# Patient Record
Sex: Male | Born: 1970
Health system: Southern US, Community
[De-identification: ages and names within clinical notes are randomized; demographics above are authoritative.]

## PROBLEM LIST (undated history)

## (undated) DIAGNOSIS — N183 Chronic kidney disease, stage 3 unspecified: Secondary | ICD-10-CM

## (undated) DIAGNOSIS — L03119 Cellulitis of unspecified part of limb: Secondary | ICD-10-CM

## (undated) DIAGNOSIS — I10 Essential (primary) hypertension: Secondary | ICD-10-CM

## (undated) DIAGNOSIS — E119 Type 2 diabetes mellitus without complications: Secondary | ICD-10-CM

## (undated) DIAGNOSIS — H269 Unspecified cataract: Secondary | ICD-10-CM

## (undated) DIAGNOSIS — L97509 Non-pressure chronic ulcer of other part of unspecified foot with unspecified severity: Secondary | ICD-10-CM

## (undated) DIAGNOSIS — R7989 Other specified abnormal findings of blood chemistry: Secondary | ICD-10-CM

## (undated) DIAGNOSIS — D352 Benign neoplasm of pituitary gland: Secondary | ICD-10-CM

## (undated) DIAGNOSIS — E538 Deficiency of other specified B group vitamins: Secondary | ICD-10-CM

## (undated) DIAGNOSIS — E669 Obesity, unspecified: Secondary | ICD-10-CM

## (undated) DIAGNOSIS — I7090 Unspecified atherosclerosis: Secondary | ICD-10-CM

## (undated) DIAGNOSIS — E11621 Type 2 diabetes mellitus with foot ulcer: Secondary | ICD-10-CM

## (undated) DIAGNOSIS — D509 Iron deficiency anemia, unspecified: Secondary | ICD-10-CM

## (undated) DIAGNOSIS — M869 Osteomyelitis, unspecified: Secondary | ICD-10-CM

## (undated) DIAGNOSIS — I70209 Unspecified atherosclerosis of native arteries of extremities, unspecified extremity: Secondary | ICD-10-CM

## (undated) DIAGNOSIS — R945 Abnormal results of liver function studies: Secondary | ICD-10-CM

## (undated) DIAGNOSIS — E782 Mixed hyperlipidemia: Secondary | ICD-10-CM

## (undated) DIAGNOSIS — Z794 Long term (current) use of insulin: Secondary | ICD-10-CM

## (undated) DIAGNOSIS — E1142 Type 2 diabetes mellitus with diabetic polyneuropathy: Secondary | ICD-10-CM

## (undated) DIAGNOSIS — L02619 Cutaneous abscess of unspecified foot: Secondary | ICD-10-CM

## (undated) DIAGNOSIS — E1169 Type 2 diabetes mellitus with other specified complication: Secondary | ICD-10-CM

## (undated) HISTORY — DX: Unspecified cataract: H26.9

## (undated) HISTORY — PX: LAPAROSCOPIC CHOLECYSTECTOMY: SUR755

## (undated) HISTORY — DX: Essential (primary) hypertension: I10

---
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
## 1998-01-20 HISTORY — PX: ANAL FISSURE REPAIR: SHX2312

## 1998-06-22 ENCOUNTER — Encounter (HOSPITAL_BASED_OUTPATIENT_CLINIC_OR_DEPARTMENT_OTHER): Payer: Self-pay | Admitting: General Surgery

## 1998-06-25 ENCOUNTER — Ambulatory Visit (HOSPITAL_COMMUNITY): Admission: RE | Admit: 1998-06-25 | Discharge: 1998-06-26 | Payer: Self-pay | Admitting: General Surgery

## 2001-05-26 ENCOUNTER — Encounter: Payer: Self-pay | Admitting: Urology

## 2001-05-26 ENCOUNTER — Ambulatory Visit (HOSPITAL_COMMUNITY): Admission: RE | Admit: 2001-05-26 | Discharge: 2001-05-26 | Payer: Self-pay | Admitting: Urology

## 2001-05-26 DIAGNOSIS — D352 Benign neoplasm of pituitary gland: Secondary | ICD-10-CM

## 2001-05-26 HISTORY — DX: Benign neoplasm of pituitary gland: D35.2

## 2002-10-04 ENCOUNTER — Ambulatory Visit (HOSPITAL_COMMUNITY): Admission: RE | Admit: 2002-10-04 | Discharge: 2002-10-04 | Payer: Self-pay | Admitting: Urology

## 2002-10-04 ENCOUNTER — Ambulatory Visit (HOSPITAL_BASED_OUTPATIENT_CLINIC_OR_DEPARTMENT_OTHER): Admission: RE | Admit: 2002-10-04 | Discharge: 2002-10-04 | Payer: Self-pay | Admitting: Urology

## 2004-01-21 HISTORY — PX: CIRCUMCISION: SUR203

## 2006-07-13 ENCOUNTER — Emergency Department (HOSPITAL_COMMUNITY): Admission: EM | Admit: 2006-07-13 | Discharge: 2006-07-13 | Payer: Self-pay | Admitting: *Deleted

## 2006-09-09 ENCOUNTER — Ambulatory Visit (HOSPITAL_COMMUNITY): Admission: RE | Admit: 2006-09-09 | Discharge: 2006-09-10 | Payer: Self-pay | Admitting: General Surgery

## 2006-09-09 ENCOUNTER — Encounter (INDEPENDENT_AMBULATORY_CARE_PROVIDER_SITE_OTHER): Payer: Self-pay | Admitting: General Surgery

## 2007-01-26 ENCOUNTER — Observation Stay (HOSPITAL_COMMUNITY): Admission: AD | Admit: 2007-01-26 | Discharge: 2007-01-28 | Payer: Self-pay | Admitting: *Deleted

## 2007-03-01 ENCOUNTER — Encounter: Admission: RE | Admit: 2007-03-01 | Discharge: 2007-03-01 | Payer: Self-pay | Admitting: *Deleted

## 2010-06-04 NOTE — Discharge Summary (Signed)
NAME:  [name], [name]                ACCOUNT NO.:  [id number]   MEDICAL RECORD NO.:  [id number]          PATIENT TYPE:  INP   LOCATION:  1520                         FACILITY:  [facility]   PHYSICIAN:  Ladell Pier, M.D.   DATE OF BIRTH:  April 18, 1970   DATE OF ADMISSION:  01/26/2007  DATE OF DISCHARGE:  01/28/2007                               DISCHARGE SUMMARY   DISCHARGE DIAGNOSES:  1. Mild diabetic ketoacidosis.  2. Dehydration.  3. Type 2 diabetes diagnosed three years ago.  4. Hypertension.  5. Hyperlipidemia.  6. Status post cholecystectomy.   DISCHARGE MEDICATIONS:  1. Lantus 30 units subcu every evening.  2. Sliding scale insulin resistance scale as directed.  3. Actos 45 mg daily.  4. Januvia 100 mg daily.  5. Metformin 1000 twice daily.  That will be held until the patient      follows up with his primary care physician.  6. Baby aspirin 81 mg daily.  7. Altace 2.5 mg daily.  8. Vytorin 10/80 daily.  9. Lopid 600 mg twice daily.   FOLLOW-UP APPOINTMENTS:  The patient to follow up with Dr. Larina Bras in one  week.   PROCEDURES:  None.   CONSULTANTS:  None.   HISTORY OF PRESENT ILLNESS:  The patient is a 40 year old gentleman with  a history of diabetes who presented to his primary care physician after  weeks of polyuria and polydipsia followed by onset of nausea, vomiting  and abdominal pain.  The patient was instructed to start a diabetic  regimen.  He understood the instructions.  Past medical history, family  history, social history, meds, allergies and review of systems per  admission H&P.   PHYSICAL EXAMINATION ON DISCHARGE:  VITAL SIGNS:  Temperature 98.5,  pulse of 89, respirations 18, blood pressure 144/84, pulse ox 99% on  room air.  CBG 279.  HEENT:  Head is normocephalic, atraumatic.  Pupils reactive to light.  Throat without erythema.  CARDIOVASCULAR:  Regular rhythm.  LUNGS:  Clear bilaterally.  ABDOMEN:  Positive bowel sounds.  EXTREMITIES:  Without  edema.   HOSPITAL COURSE:  1. Mild DKA:  The patient was admitted to the hospital, given IV      fluids and was put on insulin.  His blood sugar improved.  His      anion gap closed.  His bicarb on discharge was 18.  His creatinine      increased up to 1.52.  His metformin was held.  On discharge his      creatinine is 1.23.  With a mild acidosis, will continue to hold      his metformin.  He will get his BMP checked with he follows up with      his primary care physician to see if he can restart his metformin.  2. Dyslipidemia:  He was continued on his cholesterol medication while      he was an inpatient.  3. Hypertension:  Blood pressure was mildly elevated at discharge, but      that is most likely secondary to the patient receiving high volume  fluids.  4. Hyponatremia:  The patient had mild hyponatremia during his      admission that continued to improve.  He is to get his BMP checked      when he follows up with his PCP.   DISCHARGE LABORATORIES:  Sodium 129, potassium 3.5, chloride 102, CO2  18, glucose 270, BUN 12, creatinine 1.23.  WBC 5.5, hemoglobin 12.2,  platelets 276.  Hemoglobin A1c 14.5.  Lipid panel:  Total cholesterol  109, triglycerides 79, HDL 50, LDL 43.      Ladell Pier, M.D.  Electronically Signed     NJ/MEDQ  D:  01/28/2007  T:  01/28/2007  Job:  [id number]   cc:   Dr. Lenore Manner [facility]

## 2010-06-04 NOTE — Op Note (Signed)
NAME:  [name], [name]                ACCOUNT NO.:  [id number]   MEDICAL RECORD NO.:  [id number]          PATIENT TYPE:  OIB   LOCATION:  1537                         FACILITY:  [facility]   PHYSICIAN:  Adolph Pollack, M.D.DATE OF BIRTH:  [birth year]   DATE OF PROCEDURE:  09/09/2006  DATE OF DISCHARGE:  09/10/2006                               OPERATIVE REPORT   PREOPERATIVE DIAGNOSIS:  Symptomatic cholelithiasis.   POSTOPERATIVE DIAGNOSIS:  Chronic cholecystitis.   PROCEDURE:  Laparoscopic cholecystectomy with operative cholangiogram.   SURGEON:  Adolph Pollack, M.D.   ANESTHESIA:  General.   INDICATIONS FOR PROCEDURE:  [name] is a 40 year old male with  diabetes who had a basically classic case of biliary colic and he was  seen in the emergency department at the end of June.  He had an  ultrasound performed demonstrating cholelithiasis but no other signs of  acute cholecystitis and liver function tests were normal.  He presents  now for elective laparoscopic cholecystectomy.  Of note is that his  preoperative hemoglobin A1c is 10.9.  I have had a long discussion with  him and his wife about this and about the fact that they will need to go  back with Dr. Larina Bras to talk about better control of his diabetes.  His  CBG in the holding room is 176.   TECHNIQUE:  He is brought to the operating room, placed supine on the  operating table and a general anesthetic was administered.  His  abdominal wall was then sterilely prepped and draped.  Marcaine was  infiltrated in the subumbilical region.  A small subumbilical incision  was made through the skin, the cutaneous tissue and then the fascia.  The peritoneal cavity was entered under direct vision.  A pursestring  suture of 0 Vicryl was placed around the fascial edges.  A Hassan trocar  was introduced into the peritoneal cavity and pneumoperitoneum was  created by insufflation of CO2 gas.   Next, the laparoscope was introduced.  He  was placed in reverse  Trendelenburg position, right side tilted slightly up.  A 11 mm trocar  was placed through an epigastric incision and a 5 mm trocar was placed  in the right upper quadrant laterally and one in the right upper  quadrant at the mid clavicular line.  The fundus of the gallbladder  appeared to be intrahepatic but without inflammatory change.  This was  grasped and retracted toward the shoulder.  There were significant  adhesions and significant chronic inflammatory changes from the mid body  all the way down to the infundibulum including an inflammatory type  peel.  I used careful blunt dissection, staying on the gallbladder to  free this up.  I then carefully mobilized the infundibulum of the  gallbladder.  I identified an anterior branch of the cystic artery.  It  was lying anterior to the cystic duct.  This was clipped and divided.  I  then carefully created a window around the cystic duct using blunt  dissection and was able to get good length on it.  I placed a  clip at  the cystic duct gallbladder junction.  A small incision was made in the  cystic duct and bile milked back from it.  The cholangiocatheter was  passed through the anterior abdominal wall and placed into the cystic  duct and the cholangiogram was performed.   Under real time fluoroscopy, dilute contrast was injected into the  cystic duct which was quite long.  The common hepatic, right and left  hepatic and common bile ducts all filled with contrast promptly and  contrast drained promptly into the duodenum without obvious evidence of  obstruction.  Final reports pending the radiologist's interpretation.   The cholangiocath was removed, the cystic duct was then clipped three  times proximally and divided.  I then used careful blunt dissection.  There was more significant inflammatory change from the infundibulum of  the gallbladder and I identified the posterior branch of the cystic  artery and  clipped it and divided it.  I also clipped some of the  inflammatory peel and divided it close to the gallbladder.  I then  dissected the gallbladder free from the liver and from the mid body up,  it was intrahepatic without any plane.  There was spillage of bile.  No  spillage of stones.  I eventually was able to dissect the gallbladder  free from the liver bed and place it in an Endopouch bag.  Raw surface  of the liver was then cauterized extensively to control the bleeding.  I  copiously irrigated out this area.  No bile leak or further bleeding was  noted.  Because of the extensive raw surface of liver, however, I felt  it would be prudent to leave a drain in.  I packed Surgicel in the  gallbladder fossa.  I then inserted a drain into the peritoneal cavity  and had it exit out the lateral 5 mm trocar site and was anchored to the  skin with a 3-0 nylon suture.  The drain was placed in the gallbladder  fossa.   I evacuated as much fluid as possible.  I then removed the gallbladder  in the Endopouch bag through the subumbilical port and closed the  subumbilical fascial defect under laparoscopic vision by tightening up  and tying down the pursestring suture.  The remaining trocars were  removed.  The pneumoperitoneum was released.  The drain was hooked up to  closed suction.  The skin incisions were closed with 4-0 Monocryl  subcuticular stitches followed by Steri-Strips and sterile dressings.   He tolerated the procedure well without any apparent complications and  was taken to the recovery room in satisfactory condition.  We will be  keeping him overnight for observation and to teach some drain care.      Adolph Pollack, M.D.  Electronically Signed     TJR/MEDQ  D:  09/09/2006  T:  09/10/2006  Job:  [id number]   cc:   Ace Gins, MD

## 2010-06-04 NOTE — H&P (Signed)
NAME:  [name], [name]                ACCOUNT NO.:  [id number]   MEDICAL RECORD NO.:  [id number]          PATIENT TYPE:  INP   LOCATION:  1520                         [facility]ACILITY[facility]  [facility]   PHYSICIAN:  Lonia Blood, M.D.       DATE OF BIRTH:  10/31/1970   DATE OF ADMISSION:  01/26/2007  DATE OF DISCHARGE:                              HISTORY & PHYSICAL   PRIMARY CARE PHYSICIAN:  Dr. Larina Bras with [name].   CHIEF COMPLAINT:  Dehydrated.   HISTORY OF PRESENT ILLNESS:  [name] is a 40 year old gentleman with a  history of diabetes mellitus who presented to his primary care physician  today after weeks of polyuria, polyphagia, polydipsia, followed by onset  of nausea, vomiting, abdominal pain today.  The patient has been  instructed to start on a different diabetic regimen but he misunderstood  the instructions and his diabetes got out of control.  He is not  checking his CBG's at home which definitely allowed for the possibility  of this uncontrolled diabetes to roam freely.   PAST MEDICAL HISTORY:  1. Cholecystectomy.  2. Diabetes mellitus type 2 diagnosed about 3 years ago.  3. Hypertension.  4. Hyperlipidemia.   HOME MEDICATIONS:  1. Metformin.  2. Januvia.  3. Actos.   Also the patient is supposed to be on aspirin, Altace, Lopid and  Vytorin.   SOCIAL HISTORY:  No tobacco use.  No alcohol use.  The patient is  married without children.  Works in a Fish farm manager.   FAMILY HISTORY:  Positive for diabetes in father, paternal uncles,  paternal aunts and grandmother on the father's side.   REVIEW OF SYSTEMS:  As per HPI otherwise reviewed and negative.   PHYSICAL EXAM:  Upon admission temperature 97.6, pulse 123, respirations  20, blood pressure 136/87, saturation 97% on room air.  CBG is measured  at 448.  GENERAL APPEARANCE:  This is an obese African American gentleman in no  acute distress, alert and oriented to place, person and time.  HEENT:  Head appears  normocephalic, atraumatic.  Eyes have pupils equal,  round, react to light and accommodation.  Extraocular muscles intact.  Throat clear.  NECK:  Supple.  No JVD.  CHEST:  Clear to auscultation.  No wheeze, rhonchi or crackles.  HEART:  Regular rate and rhythm without murmurs, rubs or gallop.  ABDOMEN:  Soft, nontender, obese.  Bowel sounds present.  No  hepatosplenomegaly.  EXTREMITIES:  Without edema.  SKIN:  Warm and dry without any suspicious rashes.  NEUROLOGIC:  Nonfocal.   LABORATORY VALUES ON ADMISSION:  Everything is pending.   IMPRESSION:  Uncontrolled diabetes mellitus type 2 with severe  dehydration.  The patient will be observed overnight.  Stat basic  metabolic profile will be obtained to rule out ketoacidosis.  Intravenous fluids and high doses of insulin will be started.  The  patient will be titrated on Lantus sliding scale insulin and resumed on  metformin and Actos as soon as he is able to tolerate p.o. well.  Diabetes education will be provided extensively.  Lonia Blood, M.D.  Electronically Signed     SL/MEDQ  D:  01/26/2007  T:  01/27/2007  Job:  [id number]   cc:   Ace Gins, MD

## 2010-10-09 LAB — URINALYSIS, ROUTINE W REFLEX MICROSCOPIC
Glucose, UA: >1000 — AB
Hgb urine dipstick: NEGATIVE
Ketones, ur: >80 — AB
Leukocytes, UA: NEGATIVE
Nitrite: NEGATIVE
Protein, ur: NEGATIVE
Specific Gravity, Urine: 1.03
Urobilinogen, UA: 0.2
pH: 5

## 2010-10-09 LAB — BASIC METABOLIC PANEL
BUN: 12
BUN: 17
BUN: 19
CO2: 13 — ABNORMAL LOW
CO2: 14 — ABNORMAL LOW
CO2: 18 — ABNORMAL LOW
Calcium: 10
Calcium: 8.3 — ABNORMAL LOW
Calcium: 9.1
Chloride: 102
Chloride: 93 — ABNORMAL LOW
Chloride: 98
Creatinine, Ser: 1.23
Creatinine, Ser: 1.52 — ABNORMAL HIGH
Creatinine, Ser: 1.69 — ABNORMAL HIGH
GFR calc Af Amer: 56 — ABNORMAL LOW
GFR calc Af Amer: >60
GFR calc Af Amer: >60
GFR calc non Af Amer: 46 — ABNORMAL LOW
GFR calc non Af Amer: 52 — ABNORMAL LOW
GFR calc non Af Amer: >60
Glucose, Bld: 270 — ABNORMAL HIGH
Glucose, Bld: 320 — ABNORMAL HIGH
Glucose, Bld: 386 — ABNORMAL HIGH
Potassium: 3.5
Potassium: 4.8
Potassium: 5.1
Sodium: 127 — ABNORMAL LOW
Sodium: 127 — ABNORMAL LOW
Sodium: 129 — ABNORMAL LOW

## 2010-10-09 LAB — HEMOGLOBIN A1C
Hgb A1c MFr Bld: 14.5 — ABNORMAL HIGH
Mean Plasma Glucose: 439

## 2010-10-09 LAB — LIPID PANEL
Cholesterol: 109
HDL: 50
LDL Cholesterol: 43
Total CHOL/HDL Ratio: 2.2
Triglycerides: 78
VLDL: 16

## 2010-10-09 LAB — CBC
HCT: 36 — ABNORMAL LOW
HCT: 43.7
Hemoglobin: 12.2 — ABNORMAL LOW
Hemoglobin: 14.7
MCHC: 33.7
MCHC: 34
MCV: 76.1 — ABNORMAL LOW
MCV: 76.4 — ABNORMAL LOW
Platelets: 276
Platelets: 397
RBC: 4.71
RBC: 5.75
RDW: 12.1
RDW: 12.2
WBC: 12.5 — ABNORMAL HIGH
WBC: 5.5

## 2010-10-09 LAB — URINE MICROSCOPIC-ADD ON

## 2010-11-01 LAB — URINE MICROSCOPIC-ADD ON

## 2010-11-01 LAB — HEMOGLOBIN A1C
Hgb A1c MFr Bld: 10.9 — ABNORMAL HIGH
Mean Plasma Glucose: 311

## 2010-11-01 LAB — BASIC METABOLIC PANEL
BUN: 16
CO2: 29
Calcium: 9.8
Chloride: 102
Creatinine, Ser: 1.08
GFR calc Af Amer: >60
GFR calc non Af Amer: >60
Glucose, Bld: 314 — ABNORMAL HIGH
Potassium: 4.5
Sodium: 139

## 2010-11-01 LAB — URINALYSIS, ROUTINE W REFLEX MICROSCOPIC
Bilirubin Urine: NEGATIVE
Glucose, UA: >1000 — AB
Hgb urine dipstick: NEGATIVE
Ketones, ur: NEGATIVE
Leukocytes, UA: NEGATIVE
Nitrite: NEGATIVE
Protein, ur: NEGATIVE
Specific Gravity, Urine: 1.029
Urobilinogen, UA: 1
pH: 6

## 2010-11-01 LAB — HEMOGLOBIN AND HEMATOCRIT, BLOOD
HCT: 37.1 — ABNORMAL LOW
Hemoglobin: 12.2 — ABNORMAL LOW

## 2010-11-06 LAB — URINALYSIS, ROUTINE W REFLEX MICROSCOPIC
Bilirubin Urine: NEGATIVE
Glucose, UA: >1000 — AB
Ketones, ur: 15 — AB
Leukocytes, UA: NEGATIVE
Nitrite: NEGATIVE
Protein, ur: NEGATIVE
Specific Gravity, Urine: 1.029
Urobilinogen, UA: 0.2
pH: 5.5

## 2010-11-06 LAB — DIFFERENTIAL
Basophils Absolute: 0
Basophils Relative: 0
Eosinophils Absolute: 0
Eosinophils Relative: 0
Lymphocytes Relative: 7 — ABNORMAL LOW
Lymphs Abs: 0.6 — ABNORMAL LOW
Monocytes Absolute: 0.1 — ABNORMAL LOW
Monocytes Relative: 1 — ABNORMAL LOW
Neutro Abs: 8.6 — ABNORMAL HIGH
Neutrophils Relative %: 92 — ABNORMAL HIGH

## 2010-11-06 LAB — COMPREHENSIVE METABOLIC PANEL
ALT: 31
AST: 23
Albumin: 4
Alkaline Phosphatase: 63
BUN: 13
CO2: 26
Calcium: 9.5
Chloride: 101
Creatinine, Ser: 1.04
GFR calc Af Amer: >60
GFR calc non Af Amer: >60
Glucose, Bld: 306 — ABNORMAL HIGH
Potassium: 4.2
Sodium: 136
Total Bilirubin: 1.1
Total Protein: 7.8

## 2010-11-06 LAB — URINE MICROSCOPIC-ADD ON

## 2010-11-06 LAB — CBC
HCT: 39.5
Hemoglobin: 13.1
MCHC: 33.1
MCV: 77.2 — ABNORMAL LOW
Platelets: 334
RBC: 5.12
RDW: 12.4
WBC: 9.3

## 2010-11-06 LAB — LIPASE, BLOOD: Lipase: 24

## 2012-03-15 ENCOUNTER — Encounter: Payer: BC Managed Care – PPO | Attending: Family Medicine | Admitting: *Deleted

## 2012-03-15 ENCOUNTER — Encounter: Payer: Self-pay | Admitting: *Deleted

## 2012-03-15 DIAGNOSIS — E785 Hyperlipidemia, unspecified: Secondary | ICD-10-CM | POA: Insufficient documentation

## 2012-03-15 DIAGNOSIS — E669 Obesity, unspecified: Secondary | ICD-10-CM | POA: Insufficient documentation

## 2012-03-15 DIAGNOSIS — Z713 Dietary counseling and surveillance: Secondary | ICD-10-CM | POA: Insufficient documentation

## 2012-03-15 NOTE — Patient Instructions (Signed)
Plan:  Aim for 3 Carb Choices per meal (45 grams) +/- 1 either way  Aim for 0-2 Carbs per snack if hungry  Consider reading food labels for Total Carbohydrate of foods We will work on increasing your activity level at next visit Consider checking BG at alternate times per day as directed by MD  Consider taking Novolog pre breakfast meal as we discussed to match with meals better Consider splitting Lantus and taking 40 units in AM and 40 units in PM at consistent times

## 2012-03-15 NOTE — Progress Notes (Signed)
  Medical Nutrition Therapy:  Appt start time: 0730 end time:  0830.  Assessment:  Primary concerns today: patient here for diabetes, obesity, and hypercholesterolemia. Lives with wife and 8 month old daughter. 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 Sleeps at night when he is off work. Sleep is very limited due to child care when wife is working and in class. SMBG once a day at alternate times of day. Takes both Lantus and Novolog at same time once a day in evening around 11:00 or 12:00 after dinner meal. He states his MD gives him permission to make insulin adjustments as needed. He is not familiar with difference in insulin action of the 2 types of insulin.  MEDICATIONS: see list. Diabetes medications include Metformin, Novolog and Lantus insulin   DIETARY INTAKE:  Usual eating pattern includes 2-3 meals and 1-2 snacks per day.  Everyday foods include fair variety of all food groups.  Avoided foods include pork, soda, french fries lately.    24-hr recall:  B ( AM): unsweetened cereal with 2% milk OR left overs such as soup and sandwich, small glass juice  Snk ( AM): not usually, probably asleep  L ( PM): small snack, 3 graham crackers with PNB, water Snk ( PM): fresh fruit D ( PM): at work: brings or buys: left overs; meat, starch, beans, water, OR picks up fast food; burger and salad  Snk ( PM): occasionally a snack of sunflower seeds, green tea OR occasionally a candy or beverage Beverages: water, juice, green tea  Usual physical activity: minimal  Estimated energy needs: 1600 calories 180 g carbohydrates 120 g protein 44 g fat  Progress Towards Goal(s):  In progress.   Nutritional Diagnosis:  NB-1.1 Food and nutrition-related knowledge deficit As related to diabetes management.  As evidenced by A1c of 11.9% on 02/23/12.    Intervention:  Nutrition counseling for weight loss  and diabetes initiated. Discussed basic physiology of diabetes, SMBG and rationale of checking BG at alternate times of day, A1c, Carb Counting and reading food labels, and benefits of increased activity. Also discussed insulin action and the rationale of both splitting Lantus dose when total dose is above 50 units and that Novolog is a meal time insulin as it works in tandem with the digestion of the food. He is willing to split dose of Lantus to 40 units BID and to start taking Novolog just before breakfast in AM as his post breakfast BGs are always extremely high. Plan to discuss activity options at next visit.   Plan:  Aim for 3 Carb Choices per meal (45 grams) +/- 1 either way  Aim for 0-2 Carbs per snack if hungry  Consider reading food labels for Total Carbohydrate of foods We will work on increasing your activity level at next visit Consider checking BG at alternate times per day as directed by MD  Consider taking Novolog pre breakfast meal as we discussed to match with meals better Consider splitting Lantus and taking 40 units in AM and 40 units in PM at consistent times  Handouts given during visit include: Living Well with Diabetes Carb Counting and Food Label handouts Meal Plan Card Log Sheet  Diabetes Medication Sheet  Monitoring/Evaluation:  Dietary intake, exercise, SMBG, adjusting insulin dose, and body weight in 4 week(s).

## 2012-04-12 ENCOUNTER — Encounter: Payer: Self-pay | Admitting: *Deleted

## 2012-04-12 ENCOUNTER — Encounter: Payer: BC Managed Care – PPO | Attending: Family Medicine | Admitting: *Deleted

## 2012-04-12 DIAGNOSIS — Z713 Dietary counseling and surveillance: Secondary | ICD-10-CM | POA: Insufficient documentation

## 2012-04-12 DIAGNOSIS — E669 Obesity, unspecified: Secondary | ICD-10-CM | POA: Insufficient documentation

## 2012-04-12 DIAGNOSIS — E785 Hyperlipidemia, unspecified: Secondary | ICD-10-CM | POA: Insufficient documentation

## 2012-04-12 NOTE — Progress Notes (Signed)
  Medical Nutrition Therapy:  Appt start time: 0800 end time:  0830.  Assessment:  Primary concerns today: patient here for diabetes, obesity, and hypercholesterolemia follow up visit. Patient brought BG Log Sheet as requested and all BG's are now under 200! He split Lantus in half, once before bedtime before 10 AM and the other dose around 10-12 PM at work. Taking Novolog before Breakfast now with much improved results. Struggling with Carb Counting, substituting vegetables/salad for chips and other starches as able. States he has symptoms of low BG when drops below 100 which typically occurs mid day after Novolog dose.  MEDICATIONS: see list. Diabetes medications include Metformin, Novolog and Lantus insulin   DIETARY INTAKE:  Usual eating pattern includes 2-3 meals and 1-2 snacks per day.  Everyday foods include fair variety of all food groups.  Avoided foods include pork, soda, french fries lately.    24-hr recall:  B ( AM): unsweetened cereal with 2% milk OR left overs such as soup and sandwich, small glass juice  Snk ( AM): not usually, probably asleep  L ( PM): small snack, 3 graham crackers with PNB, water Snk ( PM): fresh fruit D ( PM): at work: brings or buys: left overs; meat (but no beef now), starch, beans, water, OR picks up fast food; burger and salad  Snk ( PM): occasionally a snack of sunflower seeds, green tea OR occasionally a candy or beverage Beverages: water, juice, green tea  Usual physical activity: minimal  Estimated energy needs: 1600 calories 180 g carbohydrates 120 g protein 44 g fat  Progress Towards Goal(s):  In progress.   Nutritional Diagnosis:  NB-1.1 Food and nutrition-related knowledge deficit As related to diabetes management.  As evidenced by A1c of 11.9% on 02/23/12.    Intervention:  Work is sedentary. Has started walking upstairs and walking in the evenings to increase his activity. Plans to start taking daughter to park as weather improves.  Also discussed The 7 Minute Workout as a way to increase heart rate in a convenient location even when watching his daughter. Noted his lowest BGs are mid-day so suggested he reduce the Novolog from 30 to 25 units. Explained that the Lantus will affect his FBGs more and they are fine where they are right now.   Plan:  Continue to aim for 3 Carb Choices per meal (45 grams) +/- 1 either way  Continue to aim for 0-2 Carbs per snack if hungry  Continue reading food labels for Total Carbohydrate of foods Consider ways to increase activity level with walking and stairs as well as maybe The 7 Minute Workout, etc Continue checking BG at alternate times per day as directed by MD   Continue taking Novolog pre breakfast meal and maybe reduce from 30 to 25 units  Continue splitting Lantus and taking 40 units in AM and 40 units in PM at consistent times   Handouts given during visit include:  Carb Counting Handout with names of APPS at bottom of page  Monitoring/Evaluation:  Dietary intake, exercise, SMBG, adjusting insulin dose, and body weight in 3 months after next MD appt and A1c test.

## 2012-04-12 NOTE — Patient Instructions (Signed)
Plan:  Continue to aim for 3 Carb Choices per meal (45 grams) +/- 1 either way  Continue to aim for 0-2 Carbs per snack if hungry  Continue reading food labels for Total Carbohydrate of foods Consider ways to increase activity level with walking and stairs as well as maybe the & Minute Workout, etc Continue checking BG at alternate times per day as directed by MD   Continue taking Novolog pre breakfast meal and maybe reduce from 30 to 25 units  Continue splitting Lantus and taking 40 units in AM and 40 units in PM at consistent times

## 2012-06-21 ENCOUNTER — Ambulatory Visit: Payer: BC Managed Care – PPO | Admitting: *Deleted

## 2012-07-05 ENCOUNTER — Encounter: Payer: BC Managed Care – PPO | Attending: Family Medicine | Admitting: *Deleted

## 2012-07-05 ENCOUNTER — Encounter: Payer: Self-pay | Admitting: *Deleted

## 2012-07-05 DIAGNOSIS — E7800 Pure hypercholesterolemia, unspecified: Secondary | ICD-10-CM | POA: Insufficient documentation

## 2012-07-05 DIAGNOSIS — E119 Type 2 diabetes mellitus without complications: Secondary | ICD-10-CM | POA: Insufficient documentation

## 2012-07-05 DIAGNOSIS — Z713 Dietary counseling and surveillance: Secondary | ICD-10-CM | POA: Insufficient documentation

## 2012-07-05 DIAGNOSIS — E669 Obesity, unspecified: Secondary | ICD-10-CM | POA: Insufficient documentation

## 2012-07-05 NOTE — Patient Instructions (Addendum)
Plan:  Continue to aim for 3 Carb Choices per meal (45 grams) +/- 1 either way  Continue to aim for 0-2 Carbs per snack if hungry  Continue reading food labels for Total Carbohydrate of foods Continue ways to increase activity level with walking and stairs as well as maybe the 7 Minute Workout, etc Continue checking BG at alternate times per day as directed by MD   Consider taking Novolog pre breakfast meal @ 20 units and add 20 units pre supper meal Continue splitting Lantus and taking 50 units in AM and 50 units in PM as tolerated and at consistent times

## 2012-07-05 NOTE — Progress Notes (Signed)
Medical Nutrition Therapy:  Appt start time: 0900 end time:  0930.  Assessment:  Primary concerns today: patient here for diabetes, obesity, and hypercholesterolemia follow up visit. Patient again brought his BG Log sheet. He states when he went to MD and received results of his A1c, even though it was better it was still quite high and he was so disappointed he ended up eating excessively out of frustration. He states that when he takes the 30 units of Novolog at breakfast his BG is dropping too low 4 hours later. Also he is not taking any Novolog for his evening meal which could be contributing to his higher BG when he gets off work.       Patient brought BG Log Sheet as requested and all BG's are now under 200! He split Lantus in half, once before bedtime before 10 AM and the other dose around 10-12 PM at work. Taking Novolog before Breakfast now with much improved results. Struggling with Carb Counting, substituting vegetables/salad for chips and other starches as able. States he has symptoms of low BG when drops below 100 which typically occurs mid day after Novolog dose.  MEDICATIONS: see list. Diabetes medications include Metformin, Novolog and Lantus insulin   DIETARY INTAKE:  Usual eating pattern includes 2-3 meals and 1-2 snacks per day.  Everyday foods include fair variety of all food groups.  Avoided foods include pork, soda, french fries lately.    24-hr recall:  B ( AM): unsweetened cereal with 2% milk OR left overs such as soup and sandwich, small glass juice  Snk ( AM): not usually, probably asleep  L ( PM): small snack, 3 graham crackers with PNB, water Snk ( PM): fresh fruit D ( PM): at work: brings or buys: left overs; meat (but no beef now), starch, beans, water, OR picks up fast food; burger and salad  Snk ( PM): occasionally a snack of sunflower seeds, green tea OR occasionally a candy or beverage Beverages: water, juice, green tea  Usual physical activity:  minimal  Estimated energy needs: 1600 calories 180 g carbohydrates 120 g protein 44 g fat  Progress Towards Goal(s):  In progress.   Nutritional Diagnosis:  NB-1.1 Food and nutrition-related knowledge deficit As related to diabetes management.  As evidenced by A1c of 11.9% on 02/23/12.    Intervention:  We reviewed his insulin doses and insulin action again and he is willing to consider taking a smaller dose of Novolog at breakfast to reduce hypoglycemia after that meal and add 20 units pre supper to improve hyperglycemia after that meal. Also discussed the importance of giving himself positive support in addition to his self criticisms. I will request permission to make insulin adjustments from his PCP.   Plan:  Continue to aim for 3 Carb Choices per meal (45 grams) +/- 1 either way  Continue to aim for 0-2 Carbs per snack if hungry  Continue reading food labels for Total Carbohydrate of foods Continue ways to increase activity level with walking and stairs as well as maybe the 7 Minute Workout, etc Continue checking BG at alternate times per day as directed by MD   Consider taking Novolog pre breakfast meal @ 20 units and add 20 units pre supper meal if OK with MD Continue splitting Lantus and taking 50 units in AM and 50 units in PM as tolerated and at consistent times   Handouts given during visit include:  Insulin Action handout  Monitoring/Evaluation:  Dietary intake, exercise, SMBG,  adjusting insulin dose, and body weight in 1 month

## 2012-08-04 ENCOUNTER — Ambulatory Visit: Payer: BC Managed Care – PPO | Admitting: *Deleted

## 2012-08-25 ENCOUNTER — Encounter: Payer: BC Managed Care – PPO | Attending: Family Medicine | Admitting: *Deleted

## 2012-08-25 ENCOUNTER — Encounter: Payer: Self-pay | Admitting: *Deleted

## 2012-08-25 VITALS — Ht 72.0 in | Wt 307.4 lb

## 2012-08-25 DIAGNOSIS — Z713 Dietary counseling and surveillance: Secondary | ICD-10-CM | POA: Insufficient documentation

## 2012-08-25 DIAGNOSIS — E119 Type 2 diabetes mellitus without complications: Secondary | ICD-10-CM | POA: Insufficient documentation

## 2012-08-25 DIAGNOSIS — E669 Obesity, unspecified: Secondary | ICD-10-CM | POA: Insufficient documentation

## 2012-08-25 DIAGNOSIS — E7800 Pure hypercholesterolemia, unspecified: Secondary | ICD-10-CM | POA: Insufficient documentation

## 2012-08-25 NOTE — Progress Notes (Signed)
  Medical Nutrition Therapy:  Appt start time: 0930 end time:  1000.  Assessment:  Primary concerns today: patient here for diabetes, obesity, and hypercholesterolemia follow up visit. He states he is now taking Novolog before breakfast and supper as we discussed. Recently he moved into a single story home after wife had an auto accident and couldn't go upstairs anymore. So his schedule has been disrupted with the move. He states he continues to check BG twice a day usually with reported range of 72 to 230 mg/dl. He states his evening BGs are better now that he is taking Novolog before supper. With the move, they were eating out more, so now cooking at home now that boxes are getting unpacked. He also states he is able to give himself positive reinforcement for the appropriate choices he is making which we discussed at his last visit.  MEDICATIONS: see list. Diabetes medications include Metformin, Novolog and Lantus insulin   DIETARY INTAKE:  Usual eating pattern includes 2-3 meals and 1-2 snacks per day.  Everyday foods include fair variety of all food groups.  Avoided foods include pork, soda, french fries lately.    24-hr recall:  B ( AM): unsweetened cereal with 2% milk OR left overs such as soup and sandwich, no more juice  Snk ( AM): not usually, probably asleep  L ( PM): small snack, 3 graham crackers with PNB, water Snk ( PM): fresh fruit D ( PM): at work: brings or buys: left overs; meat (but no beef now), starch, beans, water, OR picks up fast food; burger and salad  Snk ( PM): occasionally a snack of sunflower seeds, green tea OR less frequently a candy or beverage Beverages: water, green tea, no more juice  Usual physical activity: minimal  Estimated energy needs: 1600 calories 180 g carbohydrates 120 g protein 44 g fat  Progress Towards Goal(s):  In progress.   Nutritional Diagnosis:  NB-1.1 Food and nutrition-related knowledge deficit As related to diabetes management.   As evidenced by A1c of 11.9% on 02/23/12.    Intervention: Commended him on his 2 pound weight loss and continued success with his diabetes management even with his move. Reviewed the insulin action again and he verbalized good understanding of which insulin to adjust based on fasting or post meal BG numbers. Plan follow up after his next A1c test which should be in October.     Plan:  Continue to aim for 3 Carb Choices per meal (45 grams) +/- 1 either way  Continue to aim for 0-2 Carbs per snack if hungry  Continue reading food labels for Total Carbohydrate of foods Continue ways to increase activity level with walking and stairs as well as maybe the 7 Minute Workout, etc Continue checking BG at alternate times per day as directed by MD   Continue taking Novolog pre breakfast meal @ 22 units and add 22 units pre supper meal  Continue splitting Lantus and taking 58 units in AM and 58 units in PM as tolerated and at consistent times   Handouts given during visit include:  No handouts at this visit  Monitoring/Evaluation:  Dietary intake, exercise, SMBG, adjusting insulin dose, and body weight in 2 months

## 2012-08-25 NOTE — Patient Instructions (Signed)
Plan:  Continue to aim for 3 Carb Choices per meal (45 grams) +/- 1 either way  Continue to aim for 0-2 Carbs per snack if hungry  Continue reading food labels for Total Carbohydrate of foods Continue ways to increase activity level with walking and stairs as well as maybe the 7 Minute Workout, etc Continue checking BG at alternate times per day as directed by MD   Continue taking Novolog pre breakfast meal @ 22 units and add 22 units pre supper meal  Continue splitting Lantus and taking 58 units in AM and 58 units in PM as tolerated and at consistent times

## 2015-02-02 MED FILL — metFORMIN HCL 1000 MG TABS: 1000 | 90 days supply | Qty: 180 | Fill #0

## 2015-02-02 MED FILL — ATORVASTATIN 40 MG TABLET: 40 | 90 days supply | Qty: 90 | Fill #0

## 2015-02-02 MED FILL — NovoLOG 100 UNIT/ML SOLN: 100 | 90 days supply | Qty: 40 | Fill #0

## 2015-02-02 MED FILL — LANTUS 100 UNITS/ML VIAL: 100 | 90 days supply | Qty: 150 | Fill #0

## 2015-02-08 LAB — BASIC METABOLIC PANEL
BUN: 17 mg/dL (ref 4–21)
Creatinine: 1.1 mg/dL (ref <=1.3)
Glucose: 105 mg/dL
Potassium: 4.4 mmol/L (ref 3.4–5.3)
Sodium: 141 mmol/L (ref 137–147)

## 2015-02-08 LAB — HEMOGLOBIN A1C: Hemoglobin A1C: 11.1

## 2015-02-08 LAB — CBC AND DIFFERENTIAL
HCT: 40 % — AB (ref 41–53)
Hemoglobin: 12.7 g/dL — AB (ref 13.5–17.5)
WBC: 6.7 10^3/mL

## 2015-02-08 LAB — HEPATIC FUNCTION PANEL
ALT: 47 U/L — AB (ref 10–40)
AST: 39 U/L (ref 14–40)

## 2015-02-12 ENCOUNTER — Encounter: Payer: Self-pay | Admitting: *Deleted

## 2015-02-12 ENCOUNTER — Other Ambulatory Visit: Payer: Self-pay | Admitting: *Deleted

## 2015-02-12 NOTE — Patient Outreach (Signed)
[name] Care Management   02/12/2015  [name] [birth year] [id number]  [name] [name] is an 45 y.o. male who presents to the [facility] office to enroll in the Link To Wellness program for self management assistance with Type II DM, HTN and hyperlipidemia.  Subjective:  [name] says he was referred to the program by his wife, who is an employee at the outpatient [name] He was motivated to join the program for the program pharmacy benefits and for accountability to lifestyle changes to reduce his insulin requirements and to feel better.  Objective:   Review of Systems  Constitutional: Negative.     Physical Exam  Constitutional: He is oriented to person, place, and time. He appears well-developed and well-nourished.  Cardiovascular: Normal rate and regular rhythm.   Respiratory: Effort normal.  Neurological: He is alert and oriented to person, place, and time.  Skin: Skin is warm and dry.  Psychiatric: He has a normal mood and affect. His behavior is normal. Judgment and thought content normal.   Filed Vitals:   02/12/15 0837  BP: 134/88  Pulse: 73   Filed Weights   02/12/15 0837  Weight: 312 lb 12.8 oz (141.885 kg)   Current Medications:    Current outpatient prescriptions:  .  aspirin 81 MG tablet, Take 81 mg by mouth daily., Disp: , Rfl:  .  atorvastatin (LIPITOR) 40 MG tablet, Take 40 mg by mouth daily., Disp: , Rfl:  .  Cyanocobalamin (VITAMIN B-12) 5000 MCG TBDP, Take 1 tablet by mouth., Disp: , Rfl:  .  gemfibrozil (LOPID) 600 MG tablet, Take 600 mg by mouth daily., Disp: , Rfl:  .  insulin aspart (NOVOLOG) 100 UNIT/ML injection, Inject into the skin 3 (three) times daily before meals., Takes 30 units twice daily Disp: , Rfl:  .  insulin glargine (LANTUS) 100 UNIT/ML injection, Inject 100 Units into the skin 2 (two) times daily. , Disp: , Rfl:  .  metFORMIN (GLUCOPHAGE) 1000 MG tablet, Take 1,000  mg by mouth 2 (two) times daily with a meal., Disp: , Rfl:  .  Multiple Vitamin (MULTIVITAMIN) tablet, Take 1 tablet by mouth daily., Disp: , Rfl:  .  Omega-3 Fatty Acids (FISH OIL) 1360 MG CAPS, Take 1 capsule by mouth daily., Disp: , Rfl:  .  ramipril (ALTACE) 2.5 MG capsule, Take 5 mg by mouth daily. , Disp: , Rfl:   Functional Status:   In your present state of health, do you have any difficulty performing the following activities: 02/12/2015  Hearing? N  Vision? N  Difficulty concentrating or making decisions? N  Walking or climbing stairs? N  Dressing or bathing? N  Doing errands, shopping? N    Fall/Depression Screening:    PHQ 2/9 Scores 02/12/2015  PHQ - 2 Score 0    Assessment:   Spouse of [name] employee with Type II DM, HTN and hyperlipidemia enrolling in the Link To Wellness program for self management assistance of chronic.   Plan:  [facility] CM Care Plan Problem One        Most Recent Value   Care Plan Problem One  Patient  with Type II DM, HTN and hyperlipidemia enrolling in the Link To Wellness DM program, currently not meeting Hgb A1C target, meeting  HTN treatment targets   Role Documenting the Problem One  Care Management [place] for Problem One  Active   [name] Long Term Goal (  31-90 days)  patient will report decreased incidences of taking NOVOLOG insulin at bedtime with a snack, patient will set goals related to blood glucose monitoring and activity during core class, patient e-mail his lab results of his appointment with primary care MD on 02/13/15, patient will keep Link To Wellness appointment on Feb 20th    [facility] Long Term Goal Start Date  02/12/15   Interventions for Problem One Long Term Goal  Discussed Link to Wellness program goals, requirements and benefits, reviewed member's rights and responsibilities ,provided diabetes information packet with explanation of contents, ensured member agreed and signed consent to participate and authorization to  release and receive health information, consent, participation agreement and consent to enroll in program, assessed member's current knowledge of diabetes, discussed physiology of diabetes as a chronic progressive disease with the initial problem of insulin resistance in the muscle, liver and fat cells and then increased loss of beta cell function over time resulting in decreased insulin production, reviewed patient's medications and assessed medication adherence, discussed DM medications of Metformin and Lantus and Novolog insulins,  including the mechanism of action, common side effects, dosages and dosing schedule, onset, peak,  and duration of action of insulins, discussed recommendation by ADA that a B12 level be checked intermittently since long term use of Metformin can cause B12 deficiency,  reinforced importance of taking all medications as prescribed, reviewed 24 hr dietary intake and explained that CHO counting will be taught in core class,discussed phone apps to use to help with CHO counting when food labels are not available, discussed effects of physical activity on glucose levels and long-term glucose control by improving insulin sensitivity and assisting with weight management and cardiovascular health, explained to member how to obtain a glucometer and testing supplies at no cost once initial required program education is complete, reviewed treatment goals for BP and advised he can purchase a home BP monitor for $5 at  a [facility] pharmacy, discussed results of most recent Hgb A1C, Hgb A1C goal, and correlation to estimated average glucose, reviewed upcoming appointment with primary care MD and requested he securely fax this  RNCM the results of his lab work, arranged for Link To Wellness follow up after he attends the core class in early February     [name] CM Short Term Goal #1 (0-30 days)  Patient will attend Type II DM Core classes on February 4th 2017   [name] CM Short Term Goal #1 Start Date   02/12/15   Interventions for Short Term Goal #1  ensured that patient is enrolled in the core classes, discussed Link To Wellness program requirement in order to receive program pharmacy benefit, explained location of classes      RNCM to fax today's office visit note to [name]. RNCM will meet quarterly and as needed with patient per Link To Wellness program guidelines to assist with Type II DM, HTN and hyperlipidemia self-management and assess patient's progress toward mutually set goals. Barrington Ellison RN,CCM,CDE [place] Management Coordinator Link To Wellness Office Phone [phone] Office Fax [phone]

## 2015-02-13 DIAGNOSIS — E1142 Type 2 diabetes mellitus with diabetic polyneuropathy: Secondary | ICD-10-CM | POA: Diagnosis not present

## 2015-02-13 DIAGNOSIS — Z6841 Body Mass Index (BMI) 40.0 and over, adult: Secondary | ICD-10-CM | POA: Diagnosis not present

## 2015-02-13 DIAGNOSIS — I10 Essential (primary) hypertension: Secondary | ICD-10-CM | POA: Diagnosis not present

## 2015-02-13 DIAGNOSIS — E118 Type 2 diabetes mellitus with unspecified complications: Secondary | ICD-10-CM | POA: Diagnosis not present

## 2015-02-13 DIAGNOSIS — E7800 Pure hypercholesterolemia, unspecified: Secondary | ICD-10-CM | POA: Diagnosis not present

## 2015-02-13 MED FILL — GEMFIBROZIL 600 MG TABLET: 600 | 90 days supply | Qty: 180 | Fill #0

## 2015-02-13 MED FILL — RAMIPRIL 5 MG CAPSULE: 5 | 90 days supply | Qty: 90 | Fill #0

## 2015-02-13 MED FILL — HYDROCHLOROTHIAZIDE 12.5 MG: 12.5 | 90 days supply | Qty: 90 | Fill #0

## 2015-02-24 ENCOUNTER — Encounter: Payer: 59 | Attending: Family Medicine

## 2015-02-24 VITALS — Ht 72.0 in | Wt 311.3 lb

## 2015-02-24 DIAGNOSIS — Z713 Dietary counseling and surveillance: Secondary | ICD-10-CM | POA: Insufficient documentation

## 2015-02-24 DIAGNOSIS — E119 Type 2 diabetes mellitus without complications: Secondary | ICD-10-CM

## 2015-02-25 NOTE — Progress Notes (Signed)
Patient was seen on 02/24/15 for the complete diabetes self-management series at [facility]. This is a part of the Link to [employer].  Handouts given during class include:  Living Well with Diabetes book  Carb Counting and Meal Planning book  Meal Plan Card  Carbohydrate guide  Meal planning worksheet  Low Sodium Flavoring Tips  The diabetes portion plate  Low Carbohydrate Snack Suggestions  A1c to eAG Conversion Chart  Diabetes Medications  Stress Management  Diabetes Recommended Care Schedule  Diabetes Success Plan  Core Class Satisfaction Survey  The following learning objectives were met by the patient during this course:  Describe diabetes  State some common risk factors for diabetes  Defines the role of glucose and insulin  Identifies type of diabetes and pathophysiology  Describe the relationship between diabetes and cardiovascular risk  State the members of the Healthcare Team  States the rationale for glucose monitoring  State when to test glucose  State their individual Target Range  State the importance of logging glucose readings  Describe how to interpret glucose readings  Identifies A1C target  Explain the correlation between A1c and eAG values  State symptoms and treatment of high blood glucose  State symptoms and treatment of low blood glucose  Explain proper technique for glucose testing  Identifies proper sharps disposal  Describe the role of different macronutrients on glucose  Explain how carbohydrates affect blood glucose  State what foods contain the most carbohydrates  Demonstrate carbohydrate counting  Demonstrate how to read Nutrition Facts food label  Describe effects of various fats on heart health  Describe the importance of good nutrition for health and healthy eating strategies  Describe techniques for managing your shopping, cooking and meal planning  List  strategies to follow meal plan when dining out  Describe the effects of alcohol on glucose and how to use it safely . State the amount of activity recommended for healthy living . Describe activities suitable for individual needs . Identify ways to regularly incorporate activity into daily life . Identify barriers to activity and ways to over come these barriers  Identify diabetes medications being personally used and their primary action for lowering glucose and possible side effects . Describe role of stress on blood glucose and develop strategies to address psychosocial issues . Identify diabetes complications and ways to prevent them  Explain how to manage diabetes during illness . Evaluate success in meeting personal goal . Establish 2-3 goals that they will plan to diligently work on until they return for the  40-monthfollow-up visit  Goals:   I will count my carb choices at most meals and snacks  I will be active 30 minutes or more 3 times a week  I will take my diabetes medications as scheduled  I will eat less unhealthy fats by eating less junk food  I will test my glucose at least 1 times a day, 7 days a week  I will look at patterns in my record book at least 3 days a month  To help manage stress I will  draw at least 3 times a week  Your patient has identified these potential barriers to change:  Motivation  Your patient has identified their diabetes self-care support plan as  Family Support Link to Wellness  [facility]

## 2015-02-27 ENCOUNTER — Encounter: Payer: Self-pay | Admitting: Endocrinology

## 2015-03-07 ENCOUNTER — Encounter: Payer: Self-pay | Admitting: Endocrinology

## 2015-03-07 ENCOUNTER — Ambulatory Visit (INDEPENDENT_AMBULATORY_CARE_PROVIDER_SITE_OTHER): Payer: 59 | Admitting: Endocrinology

## 2015-03-07 VITALS — BP 136/88 | HR 95 | Temp 98.4°F | Ht 72.5 in | Wt 313.0 lb

## 2015-03-07 DIAGNOSIS — E785 Hyperlipidemia, unspecified: Secondary | ICD-10-CM | POA: Diagnosis not present

## 2015-03-07 DIAGNOSIS — I10 Essential (primary) hypertension: Secondary | ICD-10-CM | POA: Diagnosis not present

## 2015-03-07 DIAGNOSIS — E11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ical complication: Secondary | ICD-10-CM | POA: Insufficient documentation

## 2015-03-07 DIAGNOSIS — Z794 Long term (current) use of insulin: Secondary | ICD-10-CM | POA: Diagnosis not present

## 2015-03-07 DIAGNOSIS — E1142 Type 2 diabetes mellitus with diabetic polyneuropathy: Secondary | ICD-10-CM

## 2015-03-07 NOTE — Patient Instructions (Addendum)
good diet and exercise significantly improve the control of your diabetes.  please let me know if you wish to be referred to a dietician.  high blood sugar is very risky to your health.  you should see an eye doctor and dentist every year.  It is very important to get all recommended vaccinations.   controlling your blood pressure and cholesterol drastically reduces the damage diabetes does to your body.  Those who smoke should quit.  please discuss these with your doctor.  check your blood sugar twice a day.  vary the time of day when you check, between before the 3 meals, and at bedtime.  also check if you have symptoms of your blood sugar being too high or too low.  please keep a record of the readings and bring it to your next appointment here (or you can bring the meter itself).  You can write it on any piece of paper.  please call us sooner if your blood sugar goes below 70, or if you have a lot of readings over 200.   blood tests are requested for you today.  We'll let you know about the results.   We'll get you a date for a follow-up appointment when we know the results

## 2015-03-07 NOTE — Progress Notes (Signed)
Subjective:    Patient ID: [name], male    DOB: 25-Aug-1970, 45 y.o.   MRN: [id number]  HPI pt states DM was dx'ed in 2002; he has moderate neuropathy of the lower extremities; he is unaware of any associated chronic complications; he has been on insulin since 2010; pt says his diet and exercise are not very good; he has never had pancreatitis, severe hypoglycemia or DKA.  He works 3rd shift.  He takes lantus (100-BID), and novolog (25-BID).  He says he misses the insulin only approx once per week. He says he seldom has hypoglycemia, and these episodes are mild. He says this happens when he goes a long time without eating.  Pt says the a1c of 11% was just after he had been out of lantus x 2 weeks.  he brings a record of his cbg's which i have reviewed today.  It varies from 91-200's.   Past Medical History  Diagnosis Date  . Diabetes mellitus without complication ([place])   . Hyperlipidemia   . Obesity   . Hypertension     Past Surgical History  Procedure Laterality Date  . Wisdom teeth extracted  1984  . Cholecystectomy    . Anal fissure repair  2000  . Circumcisum  2006    Social History   Social History  . Marital Status: Married    Spouse Name: N/A  . Number of Children: N/A  . Years of Education: N/A   Occupational History  . Not on file.   Social History Main Topics  . Smoking status: Never Smoker   . Smokeless tobacco: Never Used  . Alcohol Use: No  . Drug Use: Not on file  . Sexual Activity: Not on file   Other Topics Concern  . Not on file   Social History Narrative    Current Outpatient Prescriptions on File Prior to Visit  Medication Sig Dispense Refill  . aspirin 81 MG tablet Take 81 mg by mouth daily.    Marland Kitchen atorvastatin (LIPITOR) 40 MG tablet Take 40 mg by mouth daily.    . Cyanocobalamin (VITAMIN B-12) 5000 MCG TBDP Take 1 tablet by mouth.    Marland Kitchen gemfibrozil (LOPID) 600 MG tablet Take 600 mg by mouth daily.    . Glucose Blood (ONETOUCH ULTRA BLUE  VI) by In Vitro route. Check blood sugar 1 time per day.    . insulin aspart (NOVOLOG) 100 UNIT/ML injection Inject 40 Units into the skin once.     . insulin glargine (LANTUS) 100 UNIT/ML injection Inject 80 Units into the skin 2 (two) times daily.     . metFORMIN (GLUCOPHAGE) 1000 MG tablet Take 1,000 mg by mouth 2 (two) times daily with a meal.    . Multiple Vitamin (MULTIVITAMIN) tablet Take 1 tablet by mouth daily.    . Omega-3 Fatty Acids (FISH OIL) 1360 MG CAPS Take 1 capsule by mouth daily.    . ramipril (ALTACE) 2.5 MG capsule Take 5 mg by mouth daily.      No current facility-administered medications on file prior to visit.    No Known Allergies  Family History  Problem Relation Age of Onset  . Diabetes Father     BP 136/88 mmHg  Pulse 95  Temp(Src) 98.4 F (36.9 C) (Oral)  Ht 6' 0.5" (1.842 m)  Wt 313 lb (141.976 kg)  BMI 41.84 kg/m2  SpO2 96%    Review of Systems denies weight loss, blurry vision, headache, chest pain, sob,  n/v, urinary frequency, muscle cramps, excessive diaphoresis, depression, cold intolerance, rhinorrhea, and easy bruising.       Objective:   Physical Exam VS: see vs page GEN: no distress HEAD: head: no deformity eyes: no periorbital swelling, no proptosis external nose and ears are normal mouth: no lesion seen NECK: supple, thyroid is not enlarged CHEST WALL: no deformity LUNGS: clear to auscultation BREASTS:  No gynecomastia CV: reg rate and rhythm, no murmur ABD: abdomen is soft, nontender.  no hepatosplenomegaly.  not distended.  no hernia.  MUSCULOSKELETAL: muscle bulk and strength are grossly normal.  no obvious joint swelling.  gait is normal and steady EXTEMITIES: no deformity.  no ulcer on the feet.  feet are of normal color and temp.  no edema PULSES: dorsalis pedis intact bilat.  no carotid bruit NEURO:  cn 2-12 grossly intact.   readily moves all 4's.  sensation is intact to touch on the feet SKIN:  Normal texture and  temperature.  No rash or suspicious lesion is visible.   NODES:  None palpable at the neck PSYCH: alert, well-oriented.  Does not appear anxious nor depressed.   Lab Results  Component Value Date   CREATININE 1.1 02/08/2015   BUN 17 02/08/2015   NA 141 02/08/2015   K 4.4 02/08/2015   CL 102 01/28/2007   CO2 18* 01/28/2007   Lab Results  Component Value Date   HGBA1C 11.1 02/08/2015   i personally reviewed electrocardiogram tracing (today):  Indication: DM Impression: Nonspecific T-abnormality.   I have reviewed outside records, and summarized: Pt was noted to have severely elevated a1c, and referred here.      Assessment & Plan:  DM: severe exacerbation.  As pt says this is improved, we'll check fructosamine.    Patient is advised the following: Patient Instructions  good diet and exercise significantly improve the control of your diabetes.  please let me know if you wish to be referred to a dietician.  high blood sugar is very risky to your health.  you should see an eye doctor and dentist every year.  It is very important to get all recommended vaccinations.   controlling your blood pressure and cholesterol drastically reduces the damage diabetes does to your body.  Those who smoke should quit.  please discuss these with your doctor.  check your blood sugar twice a day.  vary the time of day when you check, between before the 3 meals, and at bedtime.  also check if you have symptoms of your blood sugar being too high or too low.  please keep a record of the readings and bring it to your next appointment here (or you can bring the meter itself).  You can write it on any piece of paper.  please call us sooner if your blood sugar goes below 70, or if you have a lot of readings over 200.   blood tests are requested for you today.  We'll let you know about the results.   We'll get you a date for a follow-up appointment when we know the results

## 2015-03-08 DIAGNOSIS — E1159 Type 2 diabetes mellitus with other circulatory complications: Secondary | ICD-10-CM | POA: Insufficient documentation

## 2015-03-08 DIAGNOSIS — I10 Essential (primary) hypertension: Secondary | ICD-10-CM | POA: Insufficient documentation

## 2015-03-08 DIAGNOSIS — E1169 Type 2 diabetes mellitus with other specified complication: Secondary | ICD-10-CM | POA: Insufficient documentation

## 2015-03-08 DIAGNOSIS — E785 Hyperlipidemia, unspecified: Secondary | ICD-10-CM | POA: Insufficient documentation

## 2015-03-08 DIAGNOSIS — I152 Hypertension secondary to endocrine disorders: Secondary | ICD-10-CM | POA: Insufficient documentation

## 2015-03-09 LAB — FRUCTOSAMINE: Fructosamine: 300 umol/L — ABNORMAL HIGH (ref 190–270)

## 2015-03-12 ENCOUNTER — Other Ambulatory Visit: Payer: Self-pay | Admitting: *Deleted

## 2015-03-12 NOTE — Patient Outreach (Signed)
[facility]) Care Management   03/12/2015  [name] [birth year] [id number]  [name] is an 45 y.o. male who presents to the [place] Management office for routine Link To Wellness follow up for self management assistance with Type II DM, HTN and hyperlipidemia.  Subjective:  [name] says he is feeling better about his blood sugar control. He also says he saw Dr. [name] to establish care with an endocrinologist on 03/07/15  and is waiting for the results of his lab work, particularly the fructosamine level, although he says he does not know the purpose of this blood test.  Objective:   Review of Systems  Constitutional: Negative.     Physical Exam  Constitutional: He is oriented to person, place, and time. He appears well-developed and well-nourished.  Respiratory: Effort normal.  Neurological: He is alert and oriented to person, place, and time.  Skin: Skin is warm and dry.  Psychiatric: He has a normal mood and affect. His behavior is normal. Judgment and thought content normal.   Filed Vitals:   03/12/15 0836  BP: 110/82   Filed Weights   03/12/15 0836  Weight: 310 lb 6.4 oz (140.797 kg)    Current Medications:   Current Outpatient Prescriptions  Medication Sig Dispense Refill  . aspirin 81 MG tablet Take 81 mg by mouth daily.    Marland Kitchen atorvastatin (LIPITOR) 40 MG tablet Take 40 mg by mouth daily.    . Cyanocobalamin (VITAMIN B-12) 5000 MCG TBDP Take 1 tablet by mouth.    Marland Kitchen gemfibrozil (LOPID) 600 MG tablet Take 600 mg by mouth daily.    . Glucose Blood (ONETOUCH ULTRA BLUE VI) by In Vitro route. Check blood sugar 1 time per day.    . hydrochlorothiazide (MICROZIDE) 12.5 MG capsule   3  . insulin aspart (NOVOLOG) 100 UNIT/ML injection Inject 40 Units into the skin once.     . insulin glargine (LANTUS) 100 UNIT/ML injection Inject 80 Units into the skin 2 (two) times daily.     . metFORMIN (GLUCOPHAGE) 1000 MG  tablet Take 1,000 mg by mouth 2 (two) times daily with a meal.    . Multiple Vitamin (MULTIVITAMIN) tablet Take 1 tablet by mouth daily.    . Omega-3 Fatty Acids (FISH OIL) 1360 MG CAPS Take 1 capsule by mouth daily.    . ramipril (ALTACE) 2.5 MG capsule Take 5 mg by mouth daily.      No current facility-administered medications for this visit.    Functional Status:   In your present state of health, do you have any difficulty performing the following activities: 02/12/2015  Hearing? N  Vision? N  Difficulty concentrating or making decisions? N  Walking or climbing stairs? N  Dressing or bathing? N  Doing errands, shopping? 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 Blood sugar log shows significant improvement in blood sugars since 02/27/15 likely due to improved medication adherence and dietary changes.  Plan:  [facility] CM Care Plan Problem One        Most Recent Value   Role Documenting the Problem One  Care Management Coordinator   Care Plan for Problem One  Active   [facility] Long Term Goal (31-90 days)  Improved glycemic control  as evidenced by improved blood sugar readings and improved Hgb A1C, ongoing good control HTN as evidenced by BP readings <140/<90, improved or normal lipid profile. will arrange for Link To Wellness follow up after he sees Dr. [name] in follow up    [place] Start Date  03/12/15   [facility] Long Term Goal Met Date     Interventions for Problem One Long Term Goal  reviewed patient's medications and assessed medication adherence, reviewed  DM medications of Metformin and Lantus and Novolog insulins,  including the mechanism of action, common side effects, dosages and dosing schedule, onset, peak,  and duration of action of insulins, reinforced importance of taking all medications as prescribed, reviewed  glucose log and congratulated [name] on the significant improvement in his blood sugars since 02/27/15, discussed strategies to further improve glucose control, discussed purpose of checking a fructosamine level, reviewed effects of physical activity on glucose levels and long-term glucose control by improving insulin sensitivity and assisting with weight management and cardiovascular health,  encouraged [name] to focus on walking more at his job,ensured that patient is up to date on health maintenance checks, will arrange for Link To Wellness follow up after he sees Dr. [name] in follow up      [facility] to fax today's office visit note to [name]. RNCM faxed request for result of most recent lipid profile from Dr. Barbette Hair office. RNCM will meet quarterly and as needed with patient per Link To Wellness program guidelines to assist with Type II DM, HTN and hyperlipidemia self-management and assess patient's progress toward mutually set goals. Barrington Ellison RN,CCM,CDE [place] Management Coordinator Link To Wellness Office Phone [phone] Office Fax [phone]

## 2015-03-20 DIAGNOSIS — E7800 Pure hypercholesterolemia, unspecified: Secondary | ICD-10-CM | POA: Insufficient documentation

## 2015-03-20 DIAGNOSIS — J040 Acute laryngitis: Secondary | ICD-10-CM | POA: Diagnosis not present

## 2015-03-20 DIAGNOSIS — E1142 Type 2 diabetes mellitus with diabetic polyneuropathy: Secondary | ICD-10-CM | POA: Insufficient documentation

## 2015-03-20 DIAGNOSIS — I10 Essential (primary) hypertension: Secondary | ICD-10-CM | POA: Insufficient documentation

## 2015-03-20 DIAGNOSIS — E1122 Type 2 diabetes mellitus with diabetic chronic kidney disease: Secondary | ICD-10-CM | POA: Insufficient documentation

## 2015-03-20 MED FILL — BENZONATATE 200 MG CAPSULE: 200 | 10 days supply | Qty: 30 | Fill #0

## 2015-04-19 MED FILL — ONE TOUCH ULTRA TEST STRIPS: 30 days supply | Qty: 100 | Fill #0

## 2015-05-01 MED FILL — HYDROCHLOROTHIAZIDE 12.5 MG: 12.5 | 90 days supply | Qty: 90 | Fill #1

## 2015-05-01 MED FILL — LANTUS 100 UNITS/ML VIAL: 100 | 90 days supply | Qty: 150 | Fill #1

## 2015-05-01 MED FILL — ATORVASTATIN 40 MG TABLET: 40 | 90 days supply | Qty: 90 | Fill #1

## 2015-05-01 MED FILL — RAMIPRIL 5 MG CAPSULE: 5 | 90 days supply | Qty: 90 | Fill #1

## 2015-05-01 MED FILL — metFORMIN HCL 1000 MG TABS: 1000 | 90 days supply | Qty: 180 | Fill #1

## 2015-05-01 MED FILL — NovoLOG 100 UNIT/ML SOLN: 100 | 90 days supply | Qty: 40 | Fill #1

## 2015-05-01 MED FILL — BD UF INS SYR 1 ML 31GX5/16: 31G X 5/16" | 90 days supply | Qty: 300 | Fill #0

## 2015-05-10 ENCOUNTER — Other Ambulatory Visit: Payer: Self-pay | Admitting: *Deleted

## 2015-05-10 ENCOUNTER — Encounter: Payer: Self-pay | Admitting: *Deleted

## 2015-05-10 VITALS — BP 122/88 | HR 88 | Ht 72.5 in | Wt 315.2 lb

## 2015-05-10 DIAGNOSIS — E1142 Type 2 diabetes mellitus with diabetic polyneuropathy: Secondary | ICD-10-CM

## 2015-05-10 DIAGNOSIS — Z794 Long term (current) use of insulin: Principal | ICD-10-CM

## 2015-05-10 LAB — POCT GLYCOSYLATED HEMOGLOBIN (HGB A1C): Hemoglobin A1C: 6.4

## 2015-05-10 LAB — POCT CBG (FASTING - GLUCOSE)-MANUAL ENTRY: Glucose Fasting, POC: 59 mg/dL — AB (ref 70–99)

## 2015-05-10 NOTE — Patient Outreach (Signed)
Spoke with [name] via phone and provided him with directions from Dr. [name] regarding reducing Lantus dose to 70 units bid and that office will be calling him to arrange  follow up appointment.[name] voiced understanding and compliance. Barrington Ellison RN,CCM,CDE [place] Management Coordinator Link To Wellness Office Phone [phone] Office Fax [phone]

## 2015-05-10 NOTE — Addendum Note (Signed)
Addended by: Barrington Ellison on: 05/10/2015 12:24 PM   Modules accepted: Orders

## 2015-05-10 NOTE — Patient Outreach (Addendum)
[name] Care Management   05/10/2015  [name] 07-Jul-1970 [id number]  [name] [name] is an 45 y.o. male who presents to the [place] Management office for routine Link To Wellness follow up for self management assistance with Type II DM, HTN and hyperlipidemia.  Subjective:  [name] has no complaints. He reports he attended the Type II DM  Saturday Core Class on 02/24/15 and felt it was very beneficial.  He attributes his 5 lbs weight in the last 2 months to stress eating due to work. 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  He says he has an appointment with his primary care provider, Dr. Tollie [name], on 06/09/15. He says he does not presently have a follow up with Dr. Loanne [name] but last saw him on 03/07/15. He says he wants to reduce his insulin needs. During the visit he said he felt "a little shaky" and says he has not had anything to eat or drink in about 9 hours. He defines his hypoglycemic threshold as blood sugar of around 80.   Objective:   He carries a kit with his DM testing supplies and glucose tablets with him at all times. He keeps a very neat CBG log. Entries were verified with his meter history.  Review of Systems  Constitutional: Negative.     Physical Exam  Constitutional: He is oriented to person, place, and time. He appears well-developed and well-nourished.  Cardiovascular: Normal rate and regular rhythm.   Respiratory: Effort normal.  Neurological: He is alert and oriented to person, place, and time.  Skin: Skin is warm and dry.  Psychiatric: He has a normal mood and affect. His behavior is normal. Judgment and thought content normal.   Filed Vitals:   05/10/15 0800  BP: 122/88  Pulse: 88   Filed Weights   05/10/15 0800  Weight: 315 lb 3.2 oz (142.974 kg)   POC CBG fasting for 9 hours= 59, recheck in 15 minutes after 1 peppermint= 62, after 4  glucose tabs (16 gm)= 85 POC HGB A1C= 6.4% Average fasting CBG= 96 Average post breakfast CBG= 127 Average pre dinner CBG= 96  Encounter Medications:   Outpatient Encounter Prescriptions as of 05/10/2015  Medication Sig Note  . aspirin 81 MG tablet Take 81 mg by mouth daily.   Marland Kitchen atorvastatin (LIPITOR) 40 MG tablet Take 40 mg by mouth daily.   . Cyanocobalamin (VITAMIN B-12) 5000 MCG TBDP Take 1 tablet by mouth.   Marland Kitchen gemfibrozil (LOPID) 600 MG tablet Take 600 mg by mouth daily.   . hydrochlorothiazide (MICROZIDE) 12.5 MG capsule  03/07/2015: Received from: External Pharmacy  . insulin aspart (NOVOLOG) 100 UNIT/ML injection Inject 40 Units into the skin once.  4/20/2017Michela [name] he rarely needs to use sliding scale and sometimes forgets to when it would have been appropriate to do so, no more than 32 units in last 2 months  . insulin glargine (LANTUS) 100 UNIT/ML injection Inject 80 Units into the skin 2 (two) times daily.  02/12/2015: Takes 100 units twice daily  . metFORMIN (GLUCOPHAGE) 1000 MG tablet Take 1,000 mg by mouth 2 (two) times daily with a meal.   . Multiple Vitamin (MULTIVITAMIN) tablet Take 1 tablet by mouth daily.   . Omega-3 Fatty Acids (FISH OIL) 1360 MG CAPS Take 1 capsule by mouth daily.   . ramipril (ALTACE) 2.5 MG capsule Take 5 mg by  mouth daily.  02/12/2015: Takes 5 mg daily  . Glucose Blood (ONETOUCH ULTRA BLUE VI) by In Vitro route. Check blood sugar 1 time per day.    No facility-administered encounter medications on file as of 05/10/2015.    Functional Status:   In your present state of health, do you have any difficulty performing the following activities: 05/10/2015 02/12/2015  Hearing? N N  Vision? N N  Difficulty concentrating or making decisions? N N  Walking or climbing stairs? N N  Dressing or bathing? N N  Doing errands, shopping? N N    Fall/Depression Screening:    PHQ 2/9 Scores 02/25/2015 02/12/2015  PHQ - 2 Score 0 0    Assessment:   Spouse of [employer] employee and Link To Wellness member with obesity, Type II DM, HTN and hyperlipidemia, currently meeting all treatment targets for Type II DM and HTN. POC HGB A1C= 6.4% today. Most recent lipid profile of 02/08/15 showed total cholesterol of 200 and elevated triglycerides at 224 and LDL at 116.   Plan:  [facility] CM Care Plan Problem One        Most Recent Value   Care Plan Problem One  Member with: morbid obesity BMI=42.1; Type II DM with good control as evidenced by blood sugars and POC Hgb A1C but with occasional hypoglycemia; HTN with BP readings meeting treatment targets;  and  hyperlipidemia with elevated triglycerides and LDL per lipid profile of 02/08/15.   Role Documenting the Problem One  Care Management Coordinator   Care Plan for Problem One  Active   [facility] Long Term Goal (31-90 days)  Weight loss or no weight gain at next assessment, ongoing good glycemic control as evidenced by 90% of self monitored blood sugar readings meeting target and Hgb A1C <6.5% without hypoglycemia, ongoing good control of HTN as evidenced by consistent BP readings <140/<90, and improved or normal lipid profile at next assessment   [facility]N Long Term Goal Start Date  05/10/15   [name] Long Term Goal Met Date     Interventions for Problem One Long Term Goal  assessed personal or family health history changes since last visit, reviewed effect of weight loss on insulin sensitivity, reviewed current meal plan  and discussed weight loss strategies,  reviewed patient's glucometer and blood sugar log and provided positive feedback on his continued excellent glycemic control, reviewed episodes of hyperglycemia and hypoglycemia and discussed strategies to decrease or prevent further episodes, discussed results of today's POC CBG, POC Hgb A1C, Hgb A1C goal, and correlation to estimated average glucose, with verbal cueing, observed  [name] treat his hypoglycemia using rule of 15s with appropriate teachback and resolution of hypoglycemia,  reinforced the importance of regularly scheduled meals and snacks with insulin therapy and the importance of  going no longer than 5 hours without eating or drinking,reviewed patient's medications and assessed medication adherence,  congratulated [name] on his medication adherence, reviewed  DM medications of Metformin and Lantus and Novolog insulins,  including the mechanism of action, common side effects (including weight gain with insulin) dosages and dosing schedule, onset, peak,  and duration of action of insulins, reinforced importance of continuing to take all medications as prescribed as he is doing, reviewed effects of physical activity on glucose levels and long-term glucose control by improving insulin sensitivity and assisting with weight management and cardiovascular health, reinforced walking more at his job, ensured that patient is up to date on health maintenance checks, faxed visit note and blood  sugar log to Dr. Loanne [name],  arranged for Link To Wellness follow up in July      RNCM to fax today's office visit note to Dr. Tollie [name] and office visit note with blood sugar log to Dr. Loanne [name]  RNCM will meet quarterly and as needed with patient per Link To Wellness program guidelines to assist with Type II DM, HTN and hyperlipidemia self-management and assess patient's progress toward mutually set goals.  Barrington Ellison RN,CCM,CDE [place] Management Coordinator Link To Wellness Office Phone [phone] Office Fax [phone]

## 2015-05-31 DIAGNOSIS — Z794 Long term (current) use of insulin: Secondary | ICD-10-CM | POA: Diagnosis not present

## 2015-05-31 DIAGNOSIS — E119 Type 2 diabetes mellitus without complications: Secondary | ICD-10-CM | POA: Diagnosis not present

## 2015-07-23 MED FILL — BD UF INS SYR 1 ML 31GX5/16: 31G X 5/16" | 30 days supply | Qty: 100 | Fill #1

## 2015-08-06 DIAGNOSIS — E118 Type 2 diabetes mellitus with unspecified complications: Secondary | ICD-10-CM | POA: Diagnosis not present

## 2015-08-06 DIAGNOSIS — I10 Essential (primary) hypertension: Secondary | ICD-10-CM | POA: Diagnosis not present

## 2015-08-06 DIAGNOSIS — E7800 Pure hypercholesterolemia, unspecified: Secondary | ICD-10-CM | POA: Diagnosis not present

## 2015-08-09 DIAGNOSIS — E118 Type 2 diabetes mellitus with unspecified complications: Secondary | ICD-10-CM | POA: Diagnosis not present

## 2015-08-09 DIAGNOSIS — I10 Essential (primary) hypertension: Secondary | ICD-10-CM | POA: Diagnosis not present

## 2015-08-09 DIAGNOSIS — E7800 Pure hypercholesterolemia, unspecified: Secondary | ICD-10-CM | POA: Diagnosis not present

## 2015-08-09 DIAGNOSIS — E1142 Type 2 diabetes mellitus with diabetic polyneuropathy: Secondary | ICD-10-CM | POA: Diagnosis not present

## 2015-08-09 MED FILL — RAMIPRIL 5 MG CAPSULE: 5 | 90 days supply | Qty: 90 | Fill #0

## 2015-08-09 MED FILL — GEMFIBROZIL 600 MG TABLET: 600 | 90 days supply | Qty: 180 | Fill #0

## 2015-08-09 MED FILL — NovoLOG 100 UNIT/ML SOLN: 100 | 90 days supply | Qty: 60 | Fill #0

## 2015-08-09 MED FILL — metFORMIN HCL 1000 MG TABS: 1000 | 90 days supply | Qty: 180 | Fill #0

## 2015-08-09 MED FILL — LANTUS 100 UNITS/ML VIAL: 100 | 88 days supply | Qty: 150 | Fill #0

## 2015-08-09 MED FILL — HYDROCHLOROTHIAZIDE 12.5 MG: 12.5 | 90 days supply | Qty: 90 | Fill #0

## 2015-08-09 MED FILL — ATORVASTATIN 40 MG TABLET: 40 | 90 days supply | Qty: 90 | Fill #0

## 2015-08-12 MED FILL — BD UF INS SYR 1 ML 31GX5/16: 31G X 5/16" | 25 days supply | Qty: 100 | Fill #0

## 2015-08-13 MED FILL — ONE TOUCH ULTRA TEST STRIPS: 30 days supply | Qty: 100 | Fill #1

## 2015-08-16 ENCOUNTER — Other Ambulatory Visit: Payer: Self-pay | Admitting: *Deleted

## 2015-08-16 NOTE — Patient Outreach (Signed)
[facility]) Care Management   08/16/2015  [name] 10-08-1970 [id number]  [name] is an 45 y.o. male who presents to the [place] Management office for routine Link To Wellness follow up for self management assistance with Type II DM, HTN, hyperlipidemia and obesity.  Subjective: [name] says he has been under more stress lately both at home and at work so he has been eating more concentrated sweets and occasionally forgets to  check his blood sugar for a whole day. He says he recently had his eyes checked and received a good report and he saw [name] on 7/20 and his Hgb A1C was 6.6%.  Objective:   Review of Systems  Constitutional: Negative.     Physical Exam  Constitutional: He is oriented to person, place, and time. He appears well-developed and well-nourished.  Respiratory: Effort normal.  Neurological: He is alert and oriented to person, place, and time.  Skin: Skin is warm and dry.  Psychiatric: He has a normal mood and affect. His behavior is normal. Judgment and thought content normal.   Filed Weights   08/16/15 0807  Weight: (!) 313 lb 9.6 oz (142.2 kg)   Vitals:   08/16/15 0807  BP: 120/80   Encounter Medications:   Outpatient Encounter Prescriptions as of 08/16/2015  Medication Sig Note  . aspirin 81 MG tablet Take 81 mg by mouth daily.   Marland Kitchen atorvastatin (LIPITOR) 40 MG tablet Take 40 mg by mouth daily.   . Cyanocobalamin (VITAMIN B-12) 5000 MCG TBDP Take 1 tablet by mouth.   Marland Kitchen gemfibrozil (LOPID) 600 MG tablet Take 600 mg by mouth daily.   . hydrochlorothiazide (MICROZIDE) 12.5 MG capsule  03/07/2015: Received from: External Pharmacy  . metFORMIN (GLUCOPHAGE) 1000 MG tablet Take 1,000 mg by mouth 2 (two) times daily with a meal.   . Multiple Vitamin (MULTIVITAMIN) tablet Take 1 tablet by mouth daily.   . Omega-3 Fatty Acids (FISH OIL) 1360 MG CAPS Take 1 capsule by mouth daily.   . ramipril  (ALTACE) 2.5 MG capsule Take 5 mg by mouth daily.  02/12/2015: Takes 5 mg daily  . Glucose Blood (ONETOUCH ULTRA BLUE VI) by In Vitro route. Check blood sugar 1 time per day.   . insulin aspart (NOVOLOG) 100 UNIT/ML injection Inject 40 Units into the skin once.  4/20/2017[name] he rarely needs to use sliding scale and sometimes forgets to when it would have been appropriate to do so, no more than 32 units in last 2 months  . insulin glargine (LANTUS) 100 UNIT/ML injection Inject 80 Units into the skin 2 (two) times daily.  08/16/2015: 90 units twice   No facility-administered encounter medications on file as of 08/16/2015.     Functional Status:   In your present state of health, do you have any difficulty performing the following activities: 08/16/2015 05/10/2015  Hearing? N N  Vision? N N  Difficulty concentrating or making decisions? N N  Walking or climbing stairs? N N  Dressing or bathing? N N  Doing errands, shopping? N N  Preparing Food and eating ? N -  Using the Toilet? N -  In the past six months, have you accidently leaked urine? N -  Do you have problems with loss of bowel control? N -  Managing your Medications? N -  Managing your Finances? N -  Housekeeping or managing your Housekeeping? N -  Some recent data might be hidden  Fall/Depression Screening:    PHQ 2/9 Scores 02/25/2015 02/12/2015  PHQ - 2 Score 0 0    Assessment:   Spouse of [employer] employee with Type II DM, HTN , hyperlipidemia and obesity meeting treatment targets for DM, and HTN.  Plan:  [facility] CM Care Plan Problem One   Flowsheet Row Most Recent Value  Care Plan Problem One  Type II DM, HTN, hyperlipidemia and obesity with good control of  blood sugars, normal BP readings, elevated triglycerides of 173 on 08/06/15 and current BMI of 41.95  Role Documenting the Problem One  Care Management Coordinator  Care Plan for Problem One  Active  [facility] Long Term Goal (31-90 days) Ongoing good control DM and HTN as  evidenced by Hgb A1C <7.0% and 75% of blood sugars meeting target, BP readings <140/<90, improved or normal lipid profile at next assessment and no weight gain or evidenced of weight loss at next assessment  [facility]N Long Term Goal Start Date 08/16/15  [facility] Long Term Goal Met Date    Interventions for Problem One Long Term Goal reviewed patient's glucometer and blood sugar log and provided positive feedback on his continued excellent glycemic control, reviewed occasional episodes of hyperglycemia and discussed strategies to decrease or prevent further episodes, discussed results of 08/06/15 Hgb A1C, Hgb A1C goal, and correlation to estimated average glucose,  reviewed patient's medications and assessed medication adherence, discussed AACE/ACE suggestions related to titration of Lantus and emailed [name] written information, congratulated [name] on his stated 100% medication adherence, reviewed  DM medications of Metformin and Lantus and Novolog insulins,  including the mechanism of action, common side effects, dosages and dosing schedule, onset, peak,  and duration of action of insulins, suggested he may need an occasional small bolus of Novolog if he plans to eat a concentrated sweet to prevent post dessert hyperglycemia, reinforced importance of continuing to take all medications as prescribed as he is doing, reviewed effects of physical activity on glucose levels and long-term glucose control by improving insulin sensitivity and assisting with weight management and cardiovascular health,  encouraged [name] to focus on walking more at his job, assigned [employer] education modules entitled:  Exercise and Activity For Weight Loss, Why Get Your Hgb A1C Checked, Injecting Insulin with a Pen, and Diabetes Foot Care, emailed [name] information on how to lower triglycerides,  ensured that patient is up to date on health maintenance checks,  arranged for Link To Wellness follow up in early November     RNCM to fax today's office visit  note to [name].  RNCM will meet quarterly and as needed with patient per Link To Wellness program guidelines to assist with Type II DM, HTN, hyperlipidemia and obesity self-management and assess patient's progress toward mutually set goals.  Barrington Ellison RN,CCM,CDE [place] Management Coordinator Link To Wellness Office Phone [phone] Office Fax [phone]

## 2015-09-26 MED FILL — BD UF INS SYR 1 ML 31GX5/16: 31G X 5/16" | 90 days supply | Qty: 400 | Fill #0

## 2015-10-10 DIAGNOSIS — R948 Abnormal results of function studies of other organs and systems: Secondary | ICD-10-CM | POA: Diagnosis not present

## 2015-11-22 ENCOUNTER — Other Ambulatory Visit: Payer: Self-pay | Admitting: *Deleted

## 2015-11-22 MED FILL — RAMIPRIL 5 MG CAPSULE: 5 | 90 days supply | Qty: 90 | Fill #1

## 2015-11-22 MED FILL — NovoLOG 100 UNIT/ML SOLN: 100 | 90 days supply | Qty: 60 | Fill #1

## 2015-11-22 MED FILL — GEMFIBROZIL 600 MG TABLET: 600 | 90 days supply | Qty: 180 | Fill #1

## 2015-11-22 MED FILL — LANTUS 100 UNITS/ML VIAL: 100 | 88 days supply | Qty: 150 | Fill #1

## 2015-11-22 MED FILL — ATORVASTATIN 40 MG TABLET: 40 | 90 days supply | Qty: 90 | Fill #1

## 2015-11-22 MED FILL — metFORMIN HCL 1000 MG TABS: 1000 | 90 days supply | Qty: 180 | Fill #1

## 2015-11-22 MED FILL — HYDROCHLOROTHIAZIDE 12.5 MG: 12.5 | 90 days supply | Qty: 90 | Fill #1

## 2015-11-22 NOTE — Patient Outreach (Signed)
[facility]) Care Management   11/22/2015  [name] [birth year] [id number]  [name] is an 45 y.o. male who presents to the [facility] office for routine Link To Wellness follow up for self management assistance with Type II DM, HTN, hyperlipidemia and obesity.  Subjective: [name] says he lost his job on 9/26 and been eating more now that is not working. He has not increased his activity but says he plans to join a archery club in the near future.  He says he does not know the provider that will replace Dr. Tollie [name] who retired recently as he has not been back to the practice since his last visit on 7/20 and his Hgb A1C was 6.6%. He refused offer of POC Hgb A1C check today.  He says he is considering a weight loss program named [name] Henri MD after his best friend enrolled and was successful in losing a significant amount of weight over a few months. He is asking for advice regarding this.  He says he has already enrolled in the [facility] for disease self management assistance in 2018.   Objective:   A review of his CBG log shows >90% of his fasting readings meet target and >75% of his premeal and post meal CBGs meet  target.  Review of Systems  Constitutional: Negative.     Physical Exam  Constitutional: He is oriented to person, place, and time. He appears well-developed and well-nourished.  Respiratory: Effort normal.  Neurological: He is alert and oriented to person, place, and time.  Skin: Skin is warm and dry.  Psychiatric: He has a normal mood and affect. His behavior is normal. Judgment and thought content normal.   Filed Weights   11/22/15 0806  Weight: (!) 316 lb 6.4 oz (143.5 kg)   Vitals:   11/22/15 0806  BP: 122/82   Encounter Medications:   Outpatient Encounter Prescriptions as of 11/22/2015  Medication Sig Note  . aspirin 81 MG tablet Take 81 mg by mouth daily.   Marland Kitchen atorvastatin (LIPITOR)  40 MG tablet Take 40 mg by mouth daily.   . Cyanocobalamin (VITAMIN B-12) 5000 MCG TBDP Take 1 tablet by mouth.   Marland Kitchen gemfibrozil (LOPID) 600 MG tablet Take 600 mg by mouth daily.   . hydrochlorothiazide (MICROZIDE) 12.5 MG capsule  03/07/2015: Received from: External Pharmacy  . insulin aspart (NOVOLOG) 100 UNIT/ML injection Inject 40 Units into the skin once.  11/22/2015: 20-30 units twice daily with meals  . insulin glargine (LANTUS) 100 UNIT/ML injection Inject 80 Units into the skin 2 (two) times daily.  11/22/2015: 90-95 units twice daily  . metFORMIN (GLUCOPHAGE) 1000 MG tablet Take 1,000 mg by mouth 2 (two) times daily with a meal.   . Multiple Vitamin (MULTIVITAMIN) tablet Take 1 tablet by mouth daily. 11/22/2015: twice daily  . Omega-3 Fatty Acids (FISH OIL) 1360 MG CAPS Take 1 capsule by mouth daily.   . ramipril (ALTACE) 2.5 MG capsule Take 5 mg by mouth daily.  02/12/2015: Takes 5 mg daily  . Glucose Blood (ONETOUCH ULTRA BLUE VI) by In Vitro route. Check blood sugar 1 time per day.    No facility-administered encounter medications on file as of 11/22/2015.     Functional Status:   In your present state of health, do you have any difficulty performing the following activities: 11/22/2015 08/16/2015  Hearing? N N  Vision? N N  Difficulty concentrating or making decisions? N N  Walking or climbing stairs? N N  Dressing or bathing? N N  Doing errands, shopping? N N  Preparing Food and eating ? N N  Using the Toilet? N N  In the past six months, have you accidently leaked urine? N N  Do you have problems with loss of bowel control? N N  Managing your Medications? N N  Managing your Finances? N N  Housekeeping or managing your Housekeeping? N N  Some recent data might be hidden    Fall/Depression Screening:    PHQ 2/9 Scores 02/25/2015 02/12/2015  PHQ - 2 Score 0 0    Assessment:   Spouse of [name] employee with Type II DM, HTN , hyperlipidemia and obesity meeting treatment  targets for DM, and HTN.  Plan:  [facility] CM Care Plan Problem One   Flowsheet Row Most Recent Value  Care Plan Problem One Patient with Type II DM, HTN, hyperlipidemia and obesity with good control of  blood sugars, normal BP readings, elevated triglycerides of 173 on 08/06/15 and current body mass index of 42.4  Role Documenting the Problem One  Care Management Coordinator  Care Plan for Problem One  Active  [facility] Long Term Goal (31-90 days) Ongoing good control DM and HTN as evidenced by Hgb A1C <7.0% and 75% of blood sugars meeting target, BP readings <140/<90, improved or normal lipid profile at next assessment and no weight gain or evidence of weight loss at next assessment  [facility] Long Term Goal Start Date 11/22/15  [facility] Long Term Goal Met Date    Interventions for Problem One Long Term Goal Discussed his recent job loss and the effect it has had on his DM self management skills, reviewed patient's glucometer and blood sugar log and provided positive feedback, reviewed occasional episodes of mild hyperglycemia and discussed strategies to decrease or prevent future episodes, offered POC Hgb A1C- patient refused,  reviewed  DM medications of Metformin and Lantus and Novolog insulins,  including the mechanism of action, common side effects, dosages and dosing schedule, onset, peak,  and duration of action of insulins,  reinforced importance of continuing to take all medications as prescribed as he is doing, reviewed effects of physical activity on glucose levels and long-term glucose control by improving insulin sensitivity and assisting with weight management and cardiovascular health,encouraged [name] to pursue archery for exercise, encouraged [name] to research [employer] [facility] weight loss program and ensure that it is physician supervised and affordable, ensured that he is up to date on health maintenance checks, discussed the changes to the pharmacy formulary and that novolog will no longer be covered at no cost but  that humalog will be covered at no cost, provided him with a list of the covered DM medications for the program in 2018, advised him that this RNCM will advise his provider of the formulary change and request to replace novolog with humalog for mealtime insulin coverage, with request for  2018, discussed [facility] program and advised [name] that it will replace the Link To Wellness program in 2018,     [facility] to fax today's office visit note to primary care provider.    Barrington Ellison RN,CCM,CDE [facility] Coordinator Link To Wellness Office Phone [phone] Office Fax [phone]

## 2015-11-27 MED FILL — ONE TOUCH ULTRA TEST STRIPS: 50 days supply | Qty: 100 | Fill #0

## 2015-12-11 DIAGNOSIS — B9789 Other viral agents as the cause of diseases classified elsewhere: Secondary | ICD-10-CM | POA: Diagnosis not present

## 2015-12-11 DIAGNOSIS — J069 Acute upper respiratory infection, unspecified: Secondary | ICD-10-CM | POA: Diagnosis not present

## 2015-12-11 MED FILL — BENZONATATE 200 MG CAPSULE: 200 | 10 days supply | Qty: 30 | Fill #0

## 2015-12-11 MED FILL — HYDROCODONE-HOMATROPINE SYR: 5-1.5 | 10 days supply | Qty: 120 | Fill #0

## 2015-12-12 DIAGNOSIS — R635 Abnormal weight gain: Secondary | ICD-10-CM | POA: Diagnosis not present

## 2015-12-12 DIAGNOSIS — E291 Testicular hypofunction: Secondary | ICD-10-CM | POA: Diagnosis not present

## 2015-12-20 DIAGNOSIS — E782 Mixed hyperlipidemia: Secondary | ICD-10-CM | POA: Diagnosis not present

## 2015-12-20 DIAGNOSIS — E8881 Metabolic syndrome: Secondary | ICD-10-CM | POA: Diagnosis not present

## 2015-12-20 DIAGNOSIS — I10 Essential (primary) hypertension: Secondary | ICD-10-CM | POA: Diagnosis not present

## 2015-12-20 DIAGNOSIS — E119 Type 2 diabetes mellitus without complications: Secondary | ICD-10-CM | POA: Diagnosis not present

## 2015-12-20 DIAGNOSIS — E291 Testicular hypofunction: Secondary | ICD-10-CM | POA: Diagnosis not present

## 2015-12-27 DIAGNOSIS — E8881 Metabolic syndrome: Secondary | ICD-10-CM | POA: Diagnosis not present

## 2015-12-27 DIAGNOSIS — E782 Mixed hyperlipidemia: Secondary | ICD-10-CM | POA: Diagnosis not present

## 2016-01-03 DIAGNOSIS — E8881 Metabolic syndrome: Secondary | ICD-10-CM | POA: Diagnosis not present

## 2016-01-03 DIAGNOSIS — I10 Essential (primary) hypertension: Secondary | ICD-10-CM | POA: Diagnosis not present

## 2016-01-09 MED FILL — BD UF INS SYR 1 ML 31GX5/16: 31G X 5/16" | 90 days supply | Qty: 400 | Fill #1

## 2016-01-10 DIAGNOSIS — E782 Mixed hyperlipidemia: Secondary | ICD-10-CM | POA: Diagnosis not present

## 2016-01-10 DIAGNOSIS — E119 Type 2 diabetes mellitus without complications: Secondary | ICD-10-CM | POA: Diagnosis not present

## 2016-01-17 DIAGNOSIS — E8881 Metabolic syndrome: Secondary | ICD-10-CM | POA: Diagnosis not present

## 2016-01-17 DIAGNOSIS — E119 Type 2 diabetes mellitus without complications: Secondary | ICD-10-CM | POA: Diagnosis not present

## 2016-01-24 DIAGNOSIS — E8881 Metabolic syndrome: Secondary | ICD-10-CM | POA: Diagnosis not present

## 2016-01-24 DIAGNOSIS — E782 Mixed hyperlipidemia: Secondary | ICD-10-CM | POA: Diagnosis not present

## 2016-02-01 DIAGNOSIS — E7800 Pure hypercholesterolemia, unspecified: Secondary | ICD-10-CM | POA: Diagnosis not present

## 2016-02-01 DIAGNOSIS — E782 Mixed hyperlipidemia: Secondary | ICD-10-CM | POA: Diagnosis not present

## 2016-02-01 DIAGNOSIS — I10 Essential (primary) hypertension: Secondary | ICD-10-CM | POA: Diagnosis not present

## 2016-02-01 DIAGNOSIS — E118 Type 2 diabetes mellitus with unspecified complications: Secondary | ICD-10-CM | POA: Diagnosis not present

## 2016-02-01 DIAGNOSIS — E8881 Metabolic syndrome: Secondary | ICD-10-CM | POA: Diagnosis not present

## 2016-02-01 LAB — LIPID PANEL
Cholesterol: 153 (ref 0–200)
HDL: 38 (ref 35–70)
LDL Cholesterol: 95
Triglycerides: 90 (ref 40–160)

## 2016-02-08 DIAGNOSIS — R7989 Other specified abnormal findings of blood chemistry: Secondary | ICD-10-CM | POA: Diagnosis not present

## 2016-02-08 DIAGNOSIS — E118 Type 2 diabetes mellitus with unspecified complications: Secondary | ICD-10-CM | POA: Diagnosis not present

## 2016-02-08 DIAGNOSIS — E1142 Type 2 diabetes mellitus with diabetic polyneuropathy: Secondary | ICD-10-CM | POA: Diagnosis not present

## 2016-02-08 DIAGNOSIS — E7800 Pure hypercholesterolemia, unspecified: Secondary | ICD-10-CM | POA: Diagnosis not present

## 2016-02-08 DIAGNOSIS — I10 Essential (primary) hypertension: Secondary | ICD-10-CM | POA: Diagnosis not present

## 2016-02-08 LAB — HM DIABETES FOOT EXAM: HM Diabetic Foot Exam: DECREASED

## 2016-02-08 MED FILL — RAMIPRIL 5 MG CAPSULE: 5 | 90 days supply | Qty: 90 | Fill #0

## 2016-02-08 MED FILL — LANTUS 100 UNITS/ML VIAL: 100 | 90 days supply | Qty: 150 | Fill #0

## 2016-02-08 MED FILL — JANUVIA 50 MG TABLET: 50 | 90 days supply | Qty: 90 | Fill #0

## 2016-02-08 MED FILL — HumaLOG 100 UNIT/ML SOLN: 100 | 90 days supply | Qty: 60 | Fill #0

## 2016-02-08 MED FILL — GEMFIBROZIL 600 MG TABLET: 600 | 90 days supply | Qty: 180 | Fill #0

## 2016-02-08 MED FILL — HYDROCHLOROTHIAZIDE 12.5 MG: 12.5 | 90 days supply | Qty: 90 | Fill #0

## 2016-02-12 DIAGNOSIS — E8881 Metabolic syndrome: Secondary | ICD-10-CM | POA: Diagnosis not present

## 2016-02-12 DIAGNOSIS — E119 Type 2 diabetes mellitus without complications: Secondary | ICD-10-CM | POA: Diagnosis not present

## 2016-02-12 DIAGNOSIS — E6609 Other obesity due to excess calories: Secondary | ICD-10-CM | POA: Diagnosis not present

## 2016-02-14 MED FILL — ATORVASTATIN 40 MG TABLET: 40 | 90 days supply | Qty: 135 | Fill #0

## 2016-02-19 DIAGNOSIS — E6609 Other obesity due to excess calories: Secondary | ICD-10-CM | POA: Diagnosis not present

## 2016-02-19 DIAGNOSIS — E119 Type 2 diabetes mellitus without complications: Secondary | ICD-10-CM | POA: Diagnosis not present

## 2016-02-19 DIAGNOSIS — E8881 Metabolic syndrome: Secondary | ICD-10-CM | POA: Diagnosis not present

## 2016-02-26 DIAGNOSIS — E6609 Other obesity due to excess calories: Secondary | ICD-10-CM | POA: Diagnosis not present

## 2016-02-26 DIAGNOSIS — E8881 Metabolic syndrome: Secondary | ICD-10-CM | POA: Diagnosis not present

## 2016-02-26 DIAGNOSIS — E782 Mixed hyperlipidemia: Secondary | ICD-10-CM | POA: Diagnosis not present

## 2016-03-04 DIAGNOSIS — E782 Mixed hyperlipidemia: Secondary | ICD-10-CM | POA: Diagnosis not present

## 2016-03-04 DIAGNOSIS — E8881 Metabolic syndrome: Secondary | ICD-10-CM | POA: Diagnosis not present

## 2016-03-04 DIAGNOSIS — E6609 Other obesity due to excess calories: Secondary | ICD-10-CM | POA: Diagnosis not present

## 2016-03-04 DIAGNOSIS — E119 Type 2 diabetes mellitus without complications: Secondary | ICD-10-CM | POA: Diagnosis not present

## 2016-03-11 DIAGNOSIS — E8881 Metabolic syndrome: Secondary | ICD-10-CM | POA: Diagnosis not present

## 2016-03-11 DIAGNOSIS — E6609 Other obesity due to excess calories: Secondary | ICD-10-CM | POA: Diagnosis not present

## 2016-03-11 DIAGNOSIS — R635 Abnormal weight gain: Secondary | ICD-10-CM | POA: Diagnosis not present

## 2016-03-11 DIAGNOSIS — E119 Type 2 diabetes mellitus without complications: Secondary | ICD-10-CM | POA: Diagnosis not present

## 2016-03-13 DIAGNOSIS — R7989 Other specified abnormal findings of blood chemistry: Secondary | ICD-10-CM | POA: Diagnosis not present

## 2016-03-13 LAB — BASIC METABOLIC PANEL
BUN: 32 — AB (ref 4–21)
Creatinine: 1.8 — AB (ref <=1.3)
Glucose: 80
Sodium: 140 (ref 137–147)

## 2016-03-18 DIAGNOSIS — E6609 Other obesity due to excess calories: Secondary | ICD-10-CM | POA: Diagnosis not present

## 2016-03-18 DIAGNOSIS — E119 Type 2 diabetes mellitus without complications: Secondary | ICD-10-CM | POA: Diagnosis not present

## 2016-03-18 DIAGNOSIS — E8881 Metabolic syndrome: Secondary | ICD-10-CM | POA: Diagnosis not present

## 2016-04-30 MED FILL — ACCU-CHEK GUIDE TEST STRIP: 90 days supply | Qty: 100 | Fill #0

## 2016-05-02 MED FILL — BD UF INS SYR 1 ML 31GX5/16: 31G X 5/16" | 90 days supply | Qty: 400 | Fill #2

## 2016-05-02 MED FILL — JANUVIA 50 MG TABLET: 50 | 90 days supply | Qty: 90 | Fill #1

## 2016-05-05 MED FILL — RAMIPRIL 5 MG CAPSULE: 5 | 90 days supply | Qty: 90 | Fill #1

## 2016-05-05 MED FILL — ATORVASTATIN 40 MG TABLET: 40 | 90 days supply | Qty: 135 | Fill #1

## 2016-05-05 MED FILL — HYDROCHLOROTHIAZIDE 12.5 MG: 12.5 | 90 days supply | Qty: 90 | Fill #1

## 2016-05-21 DIAGNOSIS — N183 Chronic kidney disease, stage 3 (moderate): Secondary | ICD-10-CM | POA: Diagnosis not present

## 2016-05-21 DIAGNOSIS — Z794 Long term (current) use of insulin: Secondary | ICD-10-CM | POA: Diagnosis not present

## 2016-05-21 DIAGNOSIS — E1122 Type 2 diabetes mellitus with diabetic chronic kidney disease: Secondary | ICD-10-CM | POA: Diagnosis not present

## 2016-05-21 DIAGNOSIS — I129 Hypertensive chronic kidney disease with stage 1 through stage 4 chronic kidney disease, or unspecified chronic kidney disease: Secondary | ICD-10-CM | POA: Diagnosis not present

## 2016-05-21 DIAGNOSIS — Z6836 Body mass index (BMI) 36.0-36.9, adult: Secondary | ICD-10-CM | POA: Diagnosis not present

## 2016-05-21 DIAGNOSIS — E669 Obesity, unspecified: Secondary | ICD-10-CM | POA: Diagnosis not present

## 2016-05-30 DIAGNOSIS — N183 Chronic kidney disease, stage 3 (moderate): Secondary | ICD-10-CM | POA: Diagnosis not present

## 2016-06-24 DIAGNOSIS — E669 Obesity, unspecified: Secondary | ICD-10-CM | POA: Diagnosis not present

## 2016-06-24 DIAGNOSIS — I129 Hypertensive chronic kidney disease with stage 1 through stage 4 chronic kidney disease, or unspecified chronic kidney disease: Secondary | ICD-10-CM | POA: Diagnosis not present

## 2016-06-24 DIAGNOSIS — N183 Chronic kidney disease, stage 3 (moderate): Secondary | ICD-10-CM | POA: Diagnosis not present

## 2016-06-24 DIAGNOSIS — E1122 Type 2 diabetes mellitus with diabetic chronic kidney disease: Secondary | ICD-10-CM | POA: Diagnosis not present

## 2016-06-24 DIAGNOSIS — Z794 Long term (current) use of insulin: Secondary | ICD-10-CM | POA: Diagnosis not present

## 2016-06-24 DIAGNOSIS — Z6836 Body mass index (BMI) 36.0-36.9, adult: Secondary | ICD-10-CM | POA: Diagnosis not present

## 2016-07-02 DIAGNOSIS — S90821A Blister (nonthermal), right foot, initial encounter: Secondary | ICD-10-CM | POA: Diagnosis not present

## 2016-07-02 DIAGNOSIS — L089 Local infection of the skin and subcutaneous tissue, unspecified: Secondary | ICD-10-CM | POA: Diagnosis not present

## 2016-07-03 MED FILL — DOXYCYCLINE HYCLATE 100 MG: 100 | 10 days supply | Qty: 20 | Fill #0

## 2016-07-07 MED FILL — SULFAMETHOXAZOLE/TMP DS TAB: 800-160 | 10 days supply | Qty: 40 | Fill #0

## 2016-07-10 DIAGNOSIS — H524 Presbyopia: Secondary | ICD-10-CM | POA: Diagnosis not present

## 2016-07-10 DIAGNOSIS — E119 Type 2 diabetes mellitus without complications: Secondary | ICD-10-CM | POA: Diagnosis not present

## 2016-07-23 ENCOUNTER — Emergency Department (HOSPITAL_BASED_OUTPATIENT_CLINIC_OR_DEPARTMENT_OTHER): Payer: BLUE CROSS/BLUE SHIELD

## 2016-07-23 ENCOUNTER — Emergency Department (HOSPITAL_BASED_OUTPATIENT_CLINIC_OR_DEPARTMENT_OTHER)
Admission: EM | Admit: 2016-07-23 | Discharge: 2016-07-23 | Disposition: A | Discharge status: Home / Self Care | Payer: BLUE CROSS/BLUE SHIELD | Attending: Emergency Medicine | Admitting: Emergency Medicine

## 2016-07-23 ENCOUNTER — Encounter (HOSPITAL_BASED_OUTPATIENT_CLINIC_OR_DEPARTMENT_OTHER): Payer: Self-pay | Admitting: *Deleted

## 2016-07-23 DIAGNOSIS — L97512 Non-pressure chronic ulcer of other part of right foot with fat layer exposed: Secondary | ICD-10-CM | POA: Insufficient documentation

## 2016-07-23 DIAGNOSIS — Z7982 Long term (current) use of aspirin: Secondary | ICD-10-CM | POA: Insufficient documentation

## 2016-07-23 DIAGNOSIS — M79672 Pain in left foot: Secondary | ICD-10-CM | POA: Diagnosis present

## 2016-07-23 DIAGNOSIS — E11622 Type 2 diabetes mellitus with other skin ulcer: Secondary | ICD-10-CM | POA: Diagnosis not present

## 2016-07-23 DIAGNOSIS — Z794 Long term (current) use of insulin: Secondary | ICD-10-CM | POA: Diagnosis not present

## 2016-07-23 DIAGNOSIS — M7989 Other specified soft tissue disorders: Secondary | ICD-10-CM | POA: Diagnosis not present

## 2016-07-23 DIAGNOSIS — I10 Essential (primary) hypertension: Secondary | ICD-10-CM | POA: Insufficient documentation

## 2016-07-23 DIAGNOSIS — Z7984 Long term (current) use of oral hypoglycemic drugs: Secondary | ICD-10-CM | POA: Diagnosis not present

## 2016-07-23 LAB — BASIC METABOLIC PANEL
Anion gap: 9 (ref 5–15)
BUN: 42 mg/dL — ABNORMAL HIGH (ref 6–20)
CO2: 24 mmol/L (ref 22–32)
Calcium: 9.3 mg/dL (ref 8.9–10.3)
Chloride: 102 mmol/L (ref 101–111)
Creatinine, Ser: 1.86 mg/dL — ABNORMAL HIGH (ref 0.61–1.24)
GFR calc Af Amer: 48 mL/min — ABNORMAL LOW (ref >60)
GFR calc non Af Amer: 42 mL/min — ABNORMAL LOW (ref >60)
Glucose, Bld: 134 mg/dL — ABNORMAL HIGH (ref 65–99)
Potassium: 4.3 mmol/L (ref 3.5–5.1)
Sodium: 135 mmol/L (ref 135–145)

## 2016-07-23 LAB — CBC WITH DIFFERENTIAL/PLATELET
Basophils Absolute: 0 10*3/uL (ref 0.0–0.1)
Basophils Relative: 0 %
Eosinophils Absolute: 0.1 10*3/uL (ref 0.0–0.7)
Eosinophils Relative: 1 %
HCT: 31.2 % — ABNORMAL LOW (ref 39.0–52.0)
Hemoglobin: 10.3 g/dL — ABNORMAL LOW (ref 13.0–17.0)
Lymphocytes Relative: 10 %
Lymphs Abs: 1.4 10*3/uL (ref 0.7–4.0)
MCH: 25.7 pg — ABNORMAL LOW (ref 26.0–34.0)
MCHC: 33 g/dL (ref 30.0–36.0)
MCV: 77.8 fL — ABNORMAL LOW (ref 78.0–100.0)
Monocytes Absolute: 1 10*3/uL (ref 0.1–1.0)
Monocytes Relative: 7 %
Neutro Abs: 11.4 10*3/uL — ABNORMAL HIGH (ref 1.7–7.7)
Neutrophils Relative %: 82 %
Platelets: 382 10*3/uL (ref 150–400)
RBC: 4.01 MIL/uL — ABNORMAL LOW (ref 4.22–5.81)
RDW: 13.5 % (ref 11.5–15.5)
WBC: 13.9 10*3/uL — ABNORMAL HIGH (ref 4.0–10.5)

## 2016-07-23 MED ORDER — VANCOMYCIN HCL IN DEXTROSE 1-5 GM/200ML-% IV SOLN
1000.0000 mg | Freq: Once | INTRAVENOUS | Status: AC
  Administered 2016-07-23: 1000 mg via INTRAVENOUS
  Filled 2016-07-23: qty 200

## 2016-07-23 MED ORDER — SULFAMETHOXAZOLE-TRIMETHOPRIM 800-160 MG PO TABS: 1.0000 | ORAL_TABLET | Freq: Two times a day (BID) | ORAL | 1 refills | Status: DC

## 2016-07-23 NOTE — ED Notes (Signed)
ED Provider at bedside. 

## 2016-07-23 NOTE — ED Provider Notes (Signed)
[place] DEPT [facility] Provider Note   CSN: [id number] Arrival date & time: 07/23/16  1628     History   Chief Complaint Chief Complaint  Patient presents with  . Leg Pain  . Foot Pain    HPI [name] is a 46 y.o. male.  Patient presents with complaint of nonhealing sore on the ball of his right foot. Symptoms started in late June. Is been followed by his primary care doctor which is [name]. Patient was originally started on doxycycline. Cultures of the wound showed sensitivity to Bactrim so switch to that. Seem to be improving but now getting worse. Patient does have diabetes. Patient states is occasionally a slight purulent discharge it used to be worse. But swelling of the foot is increased.      Past Medical History:  Diagnosis Date  . Diabetes mellitus without complication ([place])   . Hyperlipidemia   . Hypertension   . Obesity     Patient Active Problem List   Diagnosis Date Noted  . Dyslipidemia 03/08/2015  . HTN (hypertension) 03/08/2015  . Morbid obesity ([place]) 03/08/2015  . Diabetes ([place]) 03/07/2015    Past Surgical History:  Procedure Laterality Date  . ANAL FISSURE REPAIR  2000  . CHOLECYSTECTOMY    . Circumcisum  2006  . wisdom teeth extracted  1984       Home Medications    Prior to Admission medications   Medication Sig Start Date End Date Taking? Authorizing Provider  aspirin 81 MG tablet Take 81 mg by mouth daily.   Yes [provider]  atorvastatin (LIPITOR) 40 MG tablet Take 40 mg by mouth daily.   Yes [provider]  Cyanocobalamin (VITAMIN B-12) 5000 MCG TBDP Take 1 tablet by mouth.   Yes [provider]  gemfibrozil (LOPID) 600 MG tablet Take 600 mg by mouth daily.   Yes [provider]  Glucose Blood (ONETOUCH ULTRA BLUE VI) by In Vitro route. Check blood sugar 1 time per day.   Yes [provider]  hydrochlorothiazide (MICROZIDE) 12.5 MG capsule  02/13/15  Yes  [provider]  insulin glargine (LANTUS) 100 UNIT/ML injection Inject 80 Units into the skin 2 (two) times daily.    Yes [provider]  insulin lispro (HUMALOG) 100 UNIT/ML KiwkPen Inject into the skin.   Yes [provider]  Multiple Vitamin (MULTIVITAMIN) tablet Take 1 tablet by mouth daily.   Yes [provider]  Omega-3 Fatty Acids (FISH OIL) 1360 MG CAPS Take 1 capsule by mouth daily.   Yes [provider]  ramipril (ALTACE) 2.5 MG capsule Take 5 mg by mouth daily.    Yes [provider]  insulin aspart (NOVOLOG) 100 UNIT/ML injection Inject 40 Units into the skin once.     [provider]  metFORMIN (GLUCOPHAGE) 1000 MG tablet Take 1,000 mg by mouth 2 (two) times daily with a meal.    [provider]  sulfamethoxazole-trimethoprim (BACTRIM DS,SEPTRA DS) 800-160 MG tablet Take 1 tablet by mouth 2 (two) times daily. 07/23/16 07/30/16  [name], MD    Family History Family History  Problem Relation Age of Onset  . Diabetes Father     Social History Social History  Substance Use Topics  . Smoking status: Never Smoker  . Smokeless tobacco: Never Used  . Alcohol use No     Allergies   Patient has no known allergies.   Review of Systems Review of Systems  Constitutional:  Negative for fever.  HENT: Negative for congestion.   Eyes: Negative for visual disturbance.  Respiratory: Negative for shortness of breath.   Cardiovascular: Negative for chest pain.  Gastrointestinal: Negative for abdominal pain.  Genitourinary: Negative for dysuria.  Musculoskeletal: Negative for back pain.  Skin: Positive for wound.  Neurological: Negative for headaches.  Hematological: Does not bruise/bleed easily.  Psychiatric/Behavioral: Negative for confusion.     Physical Exam Updated Vital Signs BP 118/69 (BP Location: Left Arm)   Pulse 79   Temp 98.5 F (36.9 C) (Oral)   Resp 16   Ht 1.829 m (6')   Wt  124.7 kg (275 lb)   SpO2 99%   BMI 37.30 kg/m   Physical Exam  Constitutional: He is oriented to person, place, and time. He appears well-developed and well-nourished. No distress.  HENT:  Head: Normocephalic and atraumatic.  Mouth/Throat: Oropharynx is clear and moist.  Eyes: Conjunctivae and EOM are normal. Pupils are equal, round, and reactive to light.  Neck: Normal range of motion. Neck supple.  Cardiovascular: Normal rate, regular rhythm and normal heart sounds.   Pulmonary/Chest: Effort normal and breath sounds normal. No respiratory distress.  Abdominal: Soft. Bowel sounds are normal. There is no tenderness.  Musculoskeletal: Normal range of motion.  Right foot with soft tissue ulcer without any granulation tissue peeling of callus overall size measures probably 4 cm the ulcer itself is probably 2 cm. No purulent discharge. Swelling to the foot. Some redness to the anterior aspect of the right foot. Good cap refill to toes dorsalis pedis pulses 1+. No redness or swelling to the leg.  Neurological: He is alert and oriented to person, place, and time. No cranial nerve deficit or sensory deficit. He exhibits normal muscle tone. Coordination normal.  Skin: Skin is warm. No erythema.  Nursing note and vitals reviewed.    ED Treatments / Results  Labs (all labs ordered are listed, but only abnormal results are displayed) Labs Reviewed  CBC WITH DIFFERENTIAL/PLATELET - Abnormal; Notable for the following:       Result Value   WBC 13.9 (*)    RBC 4.01 (*)    Hemoglobin 10.3 (*)    HCT 31.2 (*)    MCV 77.8 (*)    MCH 25.7 (*)    Neutro Abs 11.4 (*)    All other components within normal limits  BASIC METABOLIC PANEL - Abnormal; Notable for the following:    Glucose, Bld 134 (*)    BUN 42 (*)    Creatinine, Ser 1.86 (*)    GFR calc non Af Amer 42 (*)    GFR calc Af Amer 48 (*)    All other components within normal limits    EKG  EKG Interpretation None        Radiology Dg Foot Complete Right  Result Date: 07/23/2016 CLINICAL DATA:  Large "blister" or ulcer on plantar surface of rt foot around distal 3rd, 4th and 5th metatarsals, w/o trauma or surgery, diabetes//a.c. EXAM: RIGHT FOOT COMPLETE - 3+ VIEW COMPARISON:  None. FINDINGS: There is soft tissue swelling along the lateral aspect of the foot, at the level of the fifth metatarsophalangeal joint. Locules of gas in this region are consistent with the history of ulceration. No soft tissue gas elsewhere. No adjacent cortical erosion. No associated radiopaque foreign body. There is no acute fracture or dislocation. IMPRESSION: Soft tissue swelling and ulcer in the region of the fifth metatarsophalangeal joint. Electronically Signed   By: Benjamine Mola  Owens Shark M.D.   On: 07/23/2016 17:31    Procedures Procedures (including critical care time)  Medications Ordered in ED Medications  vancomycin (VANCOCIN) IVPB 1000 mg/200 mL premix (0 mg Intravenous Stopped 07/23/16 1857)     Initial Impression / Assessment and Plan / ED Course  I have reviewed the triage vital signs and the nursing notes.  Pertinent labs & imaging results that were available during my care of the patient were reviewed by me and considered in my medical decision making (see chart for details).     Patient with persistent ulcer to the right foot. X-ray showed no bony involvement. Patient given a dose of 1 g of vancomycin here will be continued on the Bactrim soaks of the foot daily and close follow-up with primary care doctor. The contact primary care doctor yesterday and sounds and they're working towards a wound care referral.  Patients has a history of diabetes sugar fairly well controlled here. Renal function consistent with some renal insufficiency. Mild leukocytosis. Patient nontoxic no acute distress. Does not report admission at this time.  Final Clinical Impressions(s) / ED Diagnoses   Final diagnoses:  Skin ulcer of right  foot with fat layer exposed ([place])    New Prescriptions New Prescriptions   SULFAMETHOXAZOLE-TRIMETHOPRIM (BACTRIM DS,SEPTRA DS) 800-160 MG TABLET    Take 1 tablet by mouth 2 (two) times daily.     [name], MD 07/23/16 [phone]

## 2016-07-23 NOTE — ED Triage Notes (Signed)
Blister to the bottom of his right foot for almost 2 weeks. It has been treated with antibiotics with no improvement. Now his lower leg is swollen and painful.

## 2016-07-23 NOTE — Discharge Instructions (Signed)
Work note provided. Restart the antibiotic. Make an appointment to follow-up with your regular doctor sometime in the next couple days. We'll try to stay off the foot is much as possible. Soak the foot for 20 minutes in warm water each day.

## 2016-07-23 NOTE — ED Notes (Signed)
Patient transported to X-ray 

## 2016-07-24 MED FILL — SULFAMETHOXAZOLE/TMP DS TAB: 800-160 | 7 days supply | Qty: 14 | Fill #0

## 2016-07-28 ENCOUNTER — Emergency Department (HOSPITAL_COMMUNITY): Payer: BLUE CROSS/BLUE SHIELD

## 2016-07-28 ENCOUNTER — Inpatient Hospital Stay (HOSPITAL_COMMUNITY)
Admission: EM | Admit: 2016-07-28 | Discharge: 2016-08-14 | DRG: 617 | Disposition: A | Discharge status: Home Health | Payer: BLUE CROSS/BLUE SHIELD | Attending: Internal Medicine | Admitting: Internal Medicine

## 2016-07-28 ENCOUNTER — Encounter (HOSPITAL_COMMUNITY): Payer: Self-pay | Admitting: Emergency Medicine

## 2016-07-28 DIAGNOSIS — N179 Acute kidney failure, unspecified: Secondary | ICD-10-CM | POA: Diagnosis not present

## 2016-07-28 DIAGNOSIS — S91104A Unspecified open wound of right lesser toe(s) without damage to nail, initial encounter: Secondary | ICD-10-CM | POA: Diagnosis not present

## 2016-07-28 DIAGNOSIS — I129 Hypertensive chronic kidney disease with stage 1 through stage 4 chronic kidney disease, or unspecified chronic kidney disease: Secondary | ICD-10-CM | POA: Diagnosis present

## 2016-07-28 DIAGNOSIS — E1122 Type 2 diabetes mellitus with diabetic chronic kidney disease: Secondary | ICD-10-CM | POA: Diagnosis present

## 2016-07-28 DIAGNOSIS — R7989 Other specified abnormal findings of blood chemistry: Secondary | ICD-10-CM | POA: Diagnosis not present

## 2016-07-28 DIAGNOSIS — E11628 Type 2 diabetes mellitus with other skin complications: Secondary | ICD-10-CM | POA: Diagnosis present

## 2016-07-28 DIAGNOSIS — L97509 Non-pressure chronic ulcer of other part of unspecified foot with unspecified severity: Secondary | ICD-10-CM | POA: Diagnosis not present

## 2016-07-28 DIAGNOSIS — R42 Dizziness and giddiness: Secondary | ICD-10-CM

## 2016-07-28 DIAGNOSIS — E785 Hyperlipidemia, unspecified: Secondary | ICD-10-CM | POA: Diagnosis not present

## 2016-07-28 DIAGNOSIS — E1159 Type 2 diabetes mellitus with other circulatory complications: Secondary | ICD-10-CM | POA: Diagnosis present

## 2016-07-28 DIAGNOSIS — E1121 Type 2 diabetes mellitus with diabetic nephropathy: Secondary | ICD-10-CM | POA: Diagnosis present

## 2016-07-28 DIAGNOSIS — L97519 Non-pressure chronic ulcer of other part of right foot with unspecified severity: Secondary | ICD-10-CM | POA: Diagnosis not present

## 2016-07-28 DIAGNOSIS — I10 Essential (primary) hypertension: Secondary | ICD-10-CM | POA: Diagnosis not present

## 2016-07-28 DIAGNOSIS — E11621 Type 2 diabetes mellitus with foot ulcer: Secondary | ICD-10-CM | POA: Diagnosis not present

## 2016-07-28 DIAGNOSIS — E11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ical complication: Secondary | ICD-10-CM

## 2016-07-28 DIAGNOSIS — B952 Enterococcus as the cause of diseases classified elsewhere: Secondary | ICD-10-CM | POA: Diagnosis present

## 2016-07-28 DIAGNOSIS — Z79899 Other long term (current) drug therapy: Secondary | ICD-10-CM

## 2016-07-28 DIAGNOSIS — I96 Gangrene, not elsewhere classified: Secondary | ICD-10-CM | POA: Diagnosis not present

## 2016-07-28 DIAGNOSIS — E1169 Type 2 diabetes mellitus with other specified complication: Secondary | ICD-10-CM | POA: Diagnosis not present

## 2016-07-28 DIAGNOSIS — E1140 Type 2 diabetes mellitus with diabetic neuropathy, unspecified: Secondary | ICD-10-CM | POA: Diagnosis present

## 2016-07-28 DIAGNOSIS — M7989 Other specified soft tissue disorders: Secondary | ICD-10-CM

## 2016-07-28 DIAGNOSIS — E1152 Type 2 diabetes mellitus with diabetic peripheral angiopathy with gangrene: Secondary | ICD-10-CM | POA: Diagnosis not present

## 2016-07-28 DIAGNOSIS — N182 Chronic kidney disease, stage 2 (mild): Secondary | ICD-10-CM | POA: Diagnosis not present

## 2016-07-28 DIAGNOSIS — L089 Local infection of the skin and subcutaneous tissue, unspecified: Secondary | ICD-10-CM | POA: Diagnosis not present

## 2016-07-28 DIAGNOSIS — E11649 Type 2 diabetes mellitus with hypoglycemia without coma: Secondary | ICD-10-CM | POA: Diagnosis present

## 2016-07-28 DIAGNOSIS — I152 Hypertension secondary to endocrine disorders: Secondary | ICD-10-CM | POA: Diagnosis present

## 2016-07-28 DIAGNOSIS — R945 Abnormal results of liver function studies: Secondary | ICD-10-CM

## 2016-07-28 DIAGNOSIS — E119 Type 2 diabetes mellitus without complications: Secondary | ICD-10-CM | POA: Diagnosis not present

## 2016-07-28 DIAGNOSIS — M869 Osteomyelitis, unspecified: Secondary | ICD-10-CM | POA: Diagnosis not present

## 2016-07-28 DIAGNOSIS — D509 Iron deficiency anemia, unspecified: Secondary | ICD-10-CM | POA: Diagnosis present

## 2016-07-28 DIAGNOSIS — M868X7 Other osteomyelitis, ankle and foot: Secondary | ICD-10-CM | POA: Diagnosis not present

## 2016-07-28 DIAGNOSIS — D649 Anemia, unspecified: Secondary | ICD-10-CM | POA: Diagnosis present

## 2016-07-28 DIAGNOSIS — R52 Pain, unspecified: Secondary | ICD-10-CM

## 2016-07-28 DIAGNOSIS — Z6835 Body mass index (BMI) 35.0-35.9, adult: Secondary | ICD-10-CM

## 2016-07-28 DIAGNOSIS — D508 Other iron deficiency anemias: Secondary | ICD-10-CM | POA: Diagnosis not present

## 2016-07-28 DIAGNOSIS — Z833 Family history of diabetes mellitus: Secondary | ICD-10-CM

## 2016-07-28 DIAGNOSIS — Z794 Long term (current) use of insulin: Secondary | ICD-10-CM

## 2016-07-28 DIAGNOSIS — M86171 Other acute osteomyelitis, right ankle and foot: Secondary | ICD-10-CM | POA: Diagnosis not present

## 2016-07-28 DIAGNOSIS — Z7982 Long term (current) use of aspirin: Secondary | ICD-10-CM

## 2016-07-28 DIAGNOSIS — E860 Dehydration: Secondary | ICD-10-CM | POA: Diagnosis present

## 2016-07-28 LAB — COMPREHENSIVE METABOLIC PANEL
ALT: 103 U/L — ABNORMAL HIGH (ref 17–63)
AST: 119 U/L — ABNORMAL HIGH (ref 15–41)
Albumin: 3.4 g/dL — ABNORMAL LOW (ref 3.5–5.0)
Alkaline Phosphatase: 181 U/L — ABNORMAL HIGH (ref 38–126)
Anion gap: 10 (ref 5–15)
BUN: 39 mg/dL — ABNORMAL HIGH (ref 6–20)
CO2: 24 mmol/L (ref 22–32)
Calcium: 9.2 mg/dL (ref 8.9–10.3)
Chloride: 101 mmol/L (ref 101–111)
Creatinine, Ser: 2.73 mg/dL — ABNORMAL HIGH (ref 0.61–1.24)
GFR calc Af Amer: 30 mL/min — ABNORMAL LOW (ref >60)
GFR calc non Af Amer: 26 mL/min — ABNORMAL LOW (ref >60)
Glucose, Bld: 76 mg/dL (ref 65–99)
Potassium: 3.5 mmol/L (ref 3.5–5.1)
Sodium: 135 mmol/L (ref 135–145)
Total Bilirubin: 1 mg/dL (ref 0.3–1.2)
Total Protein: 8.7 g/dL — ABNORMAL HIGH (ref 6.5–8.1)

## 2016-07-28 LAB — URINALYSIS, ROUTINE W REFLEX MICROSCOPIC
Bilirubin Urine: NEGATIVE
Glucose, UA: NEGATIVE mg/dL
Hgb urine dipstick: NEGATIVE
Ketones, ur: NEGATIVE mg/dL
Leukocytes, UA: NEGATIVE
Nitrite: NEGATIVE
Protein, ur: 100 mg/dL — AB
Specific Gravity, Urine: 1.021 (ref 1.005–1.030)
pH: 5 (ref 5.0–8.0)

## 2016-07-28 LAB — CBC
HCT: 28.2 % — ABNORMAL LOW (ref 39.0–52.0)
Hemoglobin: 9.7 g/dL — ABNORMAL LOW (ref 13.0–17.0)
MCH: 25.9 pg — ABNORMAL LOW (ref 26.0–34.0)
MCHC: 34.4 g/dL (ref 30.0–36.0)
MCV: 75.2 fL — ABNORMAL LOW (ref 78.0–100.0)
Platelets: 430 10*3/uL — ABNORMAL HIGH (ref 150–400)
RBC: 3.75 MIL/uL — ABNORMAL LOW (ref 4.22–5.81)
RDW: 13.3 % (ref 11.5–15.5)
WBC: 22.7 10*3/uL — ABNORMAL HIGH (ref 4.0–10.5)

## 2016-07-28 LAB — PROTIME-INR
INR: 1.15
Prothrombin Time: 14.7 seconds (ref 11.4–15.2)

## 2016-07-28 LAB — DIFFERENTIAL
Basophils Absolute: 0 10*3/uL (ref 0.0–0.1)
Basophils Relative: 0 %
Eosinophils Absolute: 0 10*3/uL (ref 0.0–0.7)
Eosinophils Relative: 0 %
Lymphocytes Relative: 4 %
Lymphs Abs: 1 10*3/uL (ref 0.7–4.0)
Monocytes Absolute: 2.1 10*3/uL — ABNORMAL HIGH (ref 0.1–1.0)
Monocytes Relative: 10 %
Neutro Abs: 18.6 10*3/uL — ABNORMAL HIGH (ref 1.7–7.7)
Neutrophils Relative %: 86 %

## 2016-07-28 LAB — LIPASE, BLOOD: Lipase: 18 U/L (ref 11–51)

## 2016-07-28 LAB — I-STAT CG4 LACTIC ACID, ED: Lactic Acid, Venous: 0.98 mmol/L (ref 0.5–1.9)

## 2016-07-28 MED ORDER — SODIUM CHLORIDE 0.9 % IV BOLUS (SEPSIS)
1000.0000 mL | Freq: Once | INTRAVENOUS | Status: AC
  Administered 2016-07-28: 1000 mL via INTRAVENOUS

## 2016-07-28 MED ORDER — FENTANYL CITRATE (PF) 100 MCG/2ML IJ SOLN: 100.0000 ug | Freq: Once | INTRAMUSCULAR | Status: DC

## 2016-07-28 MED ORDER — ONDANSETRON HCL 4 MG/2ML IJ SOLN: 4.0000 mg | Freq: Once | INTRAMUSCULAR | Status: DC

## 2016-07-28 NOTE — ED Provider Notes (Signed)
[facility] DEPT Provider Note: Georgena Spurling, MD, FACEP  CSN: [id number] MRN: [id number] ARRIVAL: 07/28/16 at [place]: [id number]   CHIEF COMPLAINT  Dizziness   [name] is a 46 y.o. male with an ulcer on the sole of his right foot. This is been present for weeks. He was seen on July 4 for an ulcer of the right foot. He was discharged on Bactrim.   He is here for dizziness which began at work about 3 PM today. He describes the dizziness as the room spinning and feeling off balance. Symptoms were worse if he attempted to walk or move his head. The dizziness was associated with nausea and vomiting. He vomited twice. His symptoms abated about 5 PM on arrival to the ED.  He complains of persistent ulcer of the right foot with generalized pain and swelling of the right foot and lower leg. The swelling increases when he is up and ambulatory and improves when he is supine. He is not aware of having a fever but has had night sweats and chills. His temperature on arrival was 99.8. He denies chest pain or shortness of breath. There is a foul odor associated with the ulcer. He rates the pain in his foot as a 3 out of 10 at rest but this is worse with weightbearing and ambulation.   Past Medical History:  Diagnosis Date  . Diabetes mellitus without complication ([place])   . Hyperlipidemia   . Hypertension   . Obesity     Past Surgical History:  Procedure Laterality Date  . ANAL FISSURE REPAIR  2000  . CHOLECYSTECTOMY    . Circumcisum  2006  . wisdom teeth extracted  1984    Family History  Problem Relation Age of Onset  . Diabetes Father     Social History  Substance Use Topics  . Smoking status: Never Smoker  . Smokeless tobacco: Never Used  . Alcohol use No    Prior to Admission medications   Medication Sig Start Date End Date Taking? Authorizing Provider  aspirin 81 MG tablet Take 81 mg by mouth daily.    [provider]    atorvastatin (LIPITOR) 40 MG tablet Take 40 mg by mouth daily.    [provider]  Cyanocobalamin (VITAMIN B-12) 5000 MCG TBDP Take 1 tablet by mouth.    [provider]  gemfibrozil (LOPID) 600 MG tablet Take 600 mg by mouth daily.    [provider]  Glucose Blood (ONETOUCH ULTRA BLUE VI) by In Vitro route. Check blood sugar 1 time per day.    [provider]  hydrochlorothiazide (MICROZIDE) 12.5 MG capsule  02/13/15   [provider]  insulin aspart (NOVOLOG) 100 UNIT/ML injection Inject 40 Units into the skin once.     [provider]  insulin glargine (LANTUS) 100 UNIT/ML injection Inject 80 Units into the skin 2 (two) times daily.     [provider]  insulin lispro (HUMALOG) 100 UNIT/ML KiwkPen Inject into the skin.    [provider]  metFORMIN (GLUCOPHAGE) 1000 MG tablet Take 1,000 mg by mouth 2 (two) times daily with a meal.    [provider]  Multiple Vitamin (MULTIVITAMIN) tablet Take 1 tablet by mouth daily.    [provider]  Omega-3 Fatty Acids (FISH OIL) 1360 MG CAPS Take 1 capsule by mouth daily.    [provider]  ramipril (ALTACE) 2.5 MG capsule Take 5 mg  by mouth daily.     [provider]  sulfamethoxazole-trimethoprim (BACTRIM DS,SEPTRA DS) 800-160 MG tablet Take 1 tablet by mouth 2 (two) times daily. 07/23/16 07/30/16  [name], MD    Allergies Patient has no known allergies.   REVIEW OF SYSTEMS  Negative except as noted here or in the History of Present Illness.   PHYSICAL EXAMINATION  Initial Vital Signs Blood pressure (!) 97/56, pulse 87, temperature 99.8 F (37.7 C), temperature source Oral, resp. rate 18, height 6' (1.829 m), weight 118.8 kg (262 lb), SpO2 99 %.  Examination General: Well-developed, well-nourished male in no acute distress; appearance consistent with age of record HENT: normocephalic; atraumatic Eyes: pupils equal, round and  reactive to light; extraocular muscles intact Neck: supple Heart: regular rate and rhythm Lungs: clear to auscultation bilaterally Abdomen: soft; nondistended; nontender; bowel sounds present Extremities: No deformity; full range of motion; edema and tenderness of right lower leg from knee to foot; chronic appearing ulcer of right sole with strong Pseudomonas odor:      Neurologic: Awake, alert and oriented; motor function intact in all extremities and symmetric; no facial droop Skin: Warm and dry Psychiatric: Normal mood and affect   RESULTS  Summary of this visit's results, reviewed by myself:   EKG Interpretation  Date/Time:    Ventricular Rate:    PR Interval:    QRS Duration:   QT Interval:    QTC Calculation:   R Axis:     Text Interpretation:        Laboratory Studies: Results for orders placed or performed during the hospital encounter of 07/28/16 (from the past 24 hour(s))  Lipase, blood     Status: None   Collection Time: 07/28/16  6:44 PM  Result Value Ref Range   Lipase 18 11 - 51 U/L  Comprehensive metabolic panel     Status: Abnormal   Collection Time: 07/28/16  6:44 PM  Result Value Ref Range   Sodium 135 135 - 145 mmol/L   Potassium 3.5 3.5 - 5.1 mmol/L   Chloride 101 101 - 111 mmol/L   CO2 24 22 - 32 mmol/L   Glucose, Bld 76 65 - 99 mg/dL   BUN 39 (H) 6 - 20 mg/dL   Creatinine, Ser 2.73 (H) 0.61 - 1.24 mg/dL   Calcium 9.2 8.9 - 10.3 mg/dL   Total Protein 8.7 (H) 6.5 - 8.1 g/dL   Albumin 3.4 (L) 3.5 - 5.0 g/dL   AST 119 (H) 15 - 41 U/L   ALT 103 (H) 17 - 63 U/L   Alkaline Phosphatase 181 (H) 38 - 126 U/L   Total Bilirubin 1.0 0.3 - 1.2 mg/dL   GFR calc non Af Amer 26 (L) >60 mL/min   GFR calc Af Amer 30 (L) >60 mL/min   Anion gap 10 5 - 15  CBC     Status: Abnormal   Collection Time: 07/28/16  6:44 PM  Result Value Ref Range   WBC 22.7 (H) 4.0 - 10.5 K/uL   RBC 3.75 (L) 4.22 - 5.81 MIL/uL   Hemoglobin 9.7 (L) 13.0 - 17.0 g/dL   HCT 28.2  (L) 39.0 - 52.0 %   MCV 75.2 (L) 78.0 - 100.0 fL   MCH 25.9 (L) 26.0 - 34.0 pg   MCHC 34.4 30.0 - 36.0 g/dL   RDW 13.3 11.5 - 15.5 %   Platelets 430 (H) 150 - 400 K/uL  Differential     Status: Abnormal  Collection Time: 07/28/16  6:44 PM  Result Value Ref Range   Neutrophils Relative % 86 %   Neutro Abs 18.6 (H) 1.7 - 7.7 K/uL   Lymphocytes Relative 4 %   Lymphs Abs 1.0 0.7 - 4.0 K/uL   Monocytes Relative 10 %   Monocytes Absolute 2.1 (H) 0.1 - 1.0 K/uL   Eosinophils Relative 0 %   Eosinophils Absolute 0.0 0.0 - 0.7 K/uL   Basophils Relative 0 %   Basophils Absolute 0.0 0.0 - 0.1 K/uL  Urinalysis, Routine w reflex microscopic     Status: Abnormal   Collection Time: 07/28/16  6:46 PM  Result Value Ref Range   Color, Urine AMBER (A) YELLOW   APPearance CLOUDY (A) CLEAR   Specific Gravity, Urine 1.021 1.005 - 1.030   pH 5.0 5.0 - 8.0   Glucose, UA NEGATIVE NEGATIVE mg/dL   Hgb urine dipstick NEGATIVE NEGATIVE   Bilirubin Urine NEGATIVE NEGATIVE   Ketones, ur NEGATIVE NEGATIVE mg/dL   Protein, ur 100 (A) NEGATIVE mg/dL   Nitrite NEGATIVE NEGATIVE   Leukocytes, UA NEGATIVE NEGATIVE   RBC / HPF 0-5 0 - 5 RBC/hpf   WBC, UA 6-30 0 - 5 WBC/hpf   Bacteria, UA RARE (A) NONE SEEN   Squamous Epithelial / LPF 0-5 (A) NONE SEEN   Mucous PRESENT    Hyaline Casts, UA PRESENT    Ca Oxalate Crys, UA PRESENT   I-Stat CG4 Lactic Acid, ED     Status: None   Collection Time: 07/28/16 11:15 PM  Result Value Ref Range   Lactic Acid, Venous 0.98 0.5 - 1.9 mmol/L   Imaging Studies: Dg Chest 2 View  Result Date: 07/28/2016 CLINICAL DATA:  Dizziness and vomiting.  Sepsis protocol. EXAM: CHEST  2 VIEW COMPARISON:  Radiographs 09/08/2006 FINDINGS: Lung volumes are low. The cardiomediastinal contours are normal. The lungs are clear. Pulmonary vasculature is normal. No consolidation, pleural effusion, or pneumothorax. No acute osseous abnormalities are seen. IMPRESSION: Low lung volumes without acute  abnormality. Electronically Signed   By: Jeb Levering M.D.   On: 07/28/2016 23:26   Dg Foot Complete Right  Result Date: 07/28/2016 CLINICAL DATA:  Diabetic foot ulcer. EXAM: RIGHT FOOT COMPLETE - 3+ VIEW COMPARISON:  Radiographs 07/23/2016 FINDINGS: Increased mottled soft tissue air an increased size of soft tissue ulcer about the fifth metatarsal phalangeal joint. Decreased density in the fifth metatarsal head suspicious for acute osteomyelitis. No additional sites concerning for osteomyelitis. There are hammertoe deformity of the digits. Curvilinear density in the plantar soft tissues subjacent to the tarsal metatarsal joints may be calcification or foreign body, unchanged from prior exam. IMPRESSION: Worsening soft tissue ulcer and soft tissue air about the fifth metatarsal phalangeal joint. Decreased density of the fifth metatarsal head suspicious for osteomyelitis. Calcification versus chronic linear foreign body in the plantar soft tissues subjacent to the midfoot. Electronically Signed   By: Jeb Levering M.D.   On: 07/28/2016 23:29    ED COURSE  Nursing notes and initial vitals signs, including pulse oximetry, reviewed.  Vitals:   07/28/16 1831 07/28/16 2249 07/28/16 2335  BP: (!) 97/56 126/77 105/69  Pulse: 87 85 83  Resp: 18 19 17   Temp: 99.8 F (37.7 C) 98.2 F (36.8 C)   TempSrc: Oral Oral   SpO2: 99% 100% 99%  Weight: 118.8 kg (262 lb)    Height: 6' (1.829 m)     11:46 PM Cefepime and vancomycin ordered for osteomyelitis, Pseudomonas suspected due  to characteristic wound odor. Blood pressure improved after IV fluid bolus. Elevated creatinine concerning for acute kidney injury.  12:50 AM Dr. Blaine Hamper to admit.  PROCEDURES    ED DIAGNOSES     ICD-10-CM   1. Diabetic foot ulcer with osteomyelitis ([place]) E11.621    E11.69    L97.509    M86.9   2. Acute kidney injury ([place]) N17.9   3. Elevated LFTs R79.89   4. Vertigo R42        Daymion Nazaire, MD 07/29/16  [phone]

## 2016-07-28 NOTE — ED Triage Notes (Signed)
Patient c/o dizziness, vomiting since last week when he was seen here for his right foot ulcer.  Patient reports trying to go to work "but that didn't work so well".  Patient also c/o swelling in right foot and foul odor with wound and drainage.

## 2016-07-29 ENCOUNTER — Inpatient Hospital Stay (HOSPITAL_COMMUNITY): Payer: BLUE CROSS/BLUE SHIELD

## 2016-07-29 ENCOUNTER — Encounter (HOSPITAL_COMMUNITY): Payer: Self-pay | Admitting: Internal Medicine

## 2016-07-29 DIAGNOSIS — L97509 Non-pressure chronic ulcer of other part of unspecified foot with unspecified severity: Secondary | ICD-10-CM | POA: Diagnosis not present

## 2016-07-29 DIAGNOSIS — E11621 Type 2 diabetes mellitus with foot ulcer: Secondary | ICD-10-CM | POA: Diagnosis not present

## 2016-07-29 DIAGNOSIS — E11628 Type 2 diabetes mellitus with other skin complications: Secondary | ICD-10-CM | POA: Diagnosis present

## 2016-07-29 DIAGNOSIS — B952 Enterococcus as the cause of diseases classified elsewhere: Secondary | ICD-10-CM | POA: Diagnosis present

## 2016-07-29 DIAGNOSIS — Z6835 Body mass index (BMI) 35.0-35.9, adult: Secondary | ICD-10-CM | POA: Diagnosis not present

## 2016-07-29 DIAGNOSIS — E1121 Type 2 diabetes mellitus with diabetic nephropathy: Secondary | ICD-10-CM | POA: Diagnosis present

## 2016-07-29 DIAGNOSIS — Z79899 Other long term (current) drug therapy: Secondary | ICD-10-CM | POA: Diagnosis not present

## 2016-07-29 DIAGNOSIS — I96 Gangrene, not elsewhere classified: Secondary | ICD-10-CM | POA: Diagnosis present

## 2016-07-29 DIAGNOSIS — Z7982 Long term (current) use of aspirin: Secondary | ICD-10-CM | POA: Diagnosis not present

## 2016-07-29 DIAGNOSIS — E119 Type 2 diabetes mellitus without complications: Secondary | ICD-10-CM | POA: Diagnosis not present

## 2016-07-29 DIAGNOSIS — I10 Essential (primary) hypertension: Secondary | ICD-10-CM

## 2016-07-29 DIAGNOSIS — M7989 Other specified soft tissue disorders: Secondary | ICD-10-CM | POA: Diagnosis not present

## 2016-07-29 DIAGNOSIS — M869 Osteomyelitis, unspecified: Secondary | ICD-10-CM | POA: Diagnosis not present

## 2016-07-29 DIAGNOSIS — Z833 Family history of diabetes mellitus: Secondary | ICD-10-CM | POA: Diagnosis not present

## 2016-07-29 DIAGNOSIS — M79609 Pain in unspecified limb: Secondary | ICD-10-CM

## 2016-07-29 DIAGNOSIS — E785 Hyperlipidemia, unspecified: Secondary | ICD-10-CM

## 2016-07-29 DIAGNOSIS — E1169 Type 2 diabetes mellitus with other specified complication: Secondary | ICD-10-CM

## 2016-07-29 DIAGNOSIS — N182 Chronic kidney disease, stage 2 (mild): Secondary | ICD-10-CM

## 2016-07-29 DIAGNOSIS — I129 Hypertensive chronic kidney disease with stage 1 through stage 4 chronic kidney disease, or unspecified chronic kidney disease: Secondary | ICD-10-CM | POA: Diagnosis not present

## 2016-07-29 DIAGNOSIS — M86171 Other acute osteomyelitis, right ankle and foot: Secondary | ICD-10-CM | POA: Diagnosis not present

## 2016-07-29 DIAGNOSIS — E1152 Type 2 diabetes mellitus with diabetic peripheral angiopathy with gangrene: Secondary | ICD-10-CM | POA: Diagnosis present

## 2016-07-29 DIAGNOSIS — T8743 Infection of amputation stump, right lower extremity: Secondary | ICD-10-CM | POA: Diagnosis not present

## 2016-07-29 DIAGNOSIS — L089 Local infection of the skin and subcutaneous tissue, unspecified: Secondary | ICD-10-CM | POA: Diagnosis not present

## 2016-07-29 DIAGNOSIS — R7989 Other specified abnormal findings of blood chemistry: Secondary | ICD-10-CM | POA: Diagnosis not present

## 2016-07-29 DIAGNOSIS — L97519 Non-pressure chronic ulcer of other part of right foot with unspecified severity: Secondary | ICD-10-CM | POA: Diagnosis not present

## 2016-07-29 DIAGNOSIS — M868X7 Other osteomyelitis, ankle and foot: Secondary | ICD-10-CM | POA: Diagnosis not present

## 2016-07-29 DIAGNOSIS — N179 Acute kidney failure, unspecified: Secondary | ICD-10-CM | POA: Diagnosis not present

## 2016-07-29 DIAGNOSIS — D649 Anemia, unspecified: Secondary | ICD-10-CM | POA: Diagnosis present

## 2016-07-29 DIAGNOSIS — S91301A Unspecified open wound, right foot, initial encounter: Secondary | ICD-10-CM | POA: Diagnosis not present

## 2016-07-29 DIAGNOSIS — S91104A Unspecified open wound of right lesser toe(s) without damage to nail, initial encounter: Secondary | ICD-10-CM | POA: Diagnosis not present

## 2016-07-29 DIAGNOSIS — R42 Dizziness and giddiness: Secondary | ICD-10-CM | POA: Diagnosis present

## 2016-07-29 DIAGNOSIS — E1122 Type 2 diabetes mellitus with diabetic chronic kidney disease: Secondary | ICD-10-CM | POA: Diagnosis not present

## 2016-07-29 DIAGNOSIS — D508 Other iron deficiency anemias: Secondary | ICD-10-CM | POA: Diagnosis not present

## 2016-07-29 DIAGNOSIS — M86671 Other chronic osteomyelitis, right ankle and foot: Secondary | ICD-10-CM | POA: Diagnosis not present

## 2016-07-29 DIAGNOSIS — D509 Iron deficiency anemia, unspecified: Secondary | ICD-10-CM | POA: Diagnosis present

## 2016-07-29 DIAGNOSIS — Z794 Long term (current) use of insulin: Secondary | ICD-10-CM | POA: Diagnosis not present

## 2016-07-29 DIAGNOSIS — E1140 Type 2 diabetes mellitus with diabetic neuropathy, unspecified: Secondary | ICD-10-CM | POA: Diagnosis present

## 2016-07-29 DIAGNOSIS — E860 Dehydration: Secondary | ICD-10-CM | POA: Diagnosis present

## 2016-07-29 DIAGNOSIS — L97518 Non-pressure chronic ulcer of other part of right foot with other specified severity: Secondary | ICD-10-CM | POA: Diagnosis not present

## 2016-07-29 DIAGNOSIS — E11649 Type 2 diabetes mellitus with hypoglycemia without coma: Secondary | ICD-10-CM | POA: Diagnosis present

## 2016-07-29 LAB — CREATININE, SERUM
Creatinine, Ser: 2.33 mg/dL — ABNORMAL HIGH (ref 0.61–1.24)
GFR calc Af Amer: 37 mL/min — ABNORMAL LOW (ref >60)
GFR calc non Af Amer: 32 mL/min — ABNORMAL LOW (ref >60)

## 2016-07-29 LAB — RETICULOCYTES
RBC.: 3.48 MIL/uL — ABNORMAL LOW (ref 4.22–5.81)
Retic Count, Absolute: 24.4 10*3/uL (ref 19.0–186.0)
Retic Ct Pct: 0.7 % (ref 0.4–3.1)

## 2016-07-29 LAB — FOLATE: Folate: 13.6 ng/mL (ref >5.9)

## 2016-07-29 LAB — GLUCOSE, CAPILLARY
Glucose-Capillary: 74 mg/dL (ref 65–99)
Glucose-Capillary: 64 mg/dL — ABNORMAL LOW (ref 65–99)
Glucose-Capillary: 90 mg/dL (ref 65–99)
Glucose-Capillary: 88 mg/dL (ref 65–99)
Glucose-Capillary: 96 mg/dL (ref 65–99)
Glucose-Capillary: 94 mg/dL (ref 65–99)

## 2016-07-29 LAB — IRON AND TIBC
Iron: 9 ug/dL — ABNORMAL LOW (ref 45–182)
Saturation Ratios: 5 % — ABNORMAL LOW (ref 17.9–39.5)
TIBC: 178 ug/dL — ABNORMAL LOW (ref 250–450)
UIBC: 169 ug/dL

## 2016-07-29 LAB — CREATININE, URINE, RANDOM: Creatinine, Urine: 232.17 mg/dL

## 2016-07-29 LAB — PREALBUMIN: Prealbumin: 10.1 mg/dL — ABNORMAL LOW (ref 18–38)

## 2016-07-29 LAB — C-REACTIVE PROTEIN: CRP: 22.9 mg/dL — ABNORMAL HIGH (ref <1.0)

## 2016-07-29 LAB — VITAMIN B12: Vitamin B-12: 4656 pg/mL — ABNORMAL HIGH (ref 180–914)

## 2016-07-29 LAB — HIV ANTIBODY (ROUTINE TESTING W REFLEX): HIV Screen 4th Generation wRfx: NONREACTIVE

## 2016-07-29 LAB — SEDIMENTATION RATE: Sed Rate: >140 mm/hr — ABNORMAL HIGH (ref 0–16)

## 2016-07-29 LAB — FERRITIN: Ferritin: 705 ng/mL — ABNORMAL HIGH (ref 24–336)

## 2016-07-29 MED ORDER — OMEGA-3-ACID ETHYL ESTERS 1 G PO CAPS
1.0000 g | ORAL_CAPSULE | Freq: Every day | ORAL | Status: DC
  Administered 2016-07-29 – 2016-08-14 (×15): 1 g via ORAL
  Filled 2016-07-29 (×15): qty 1

## 2016-07-29 MED ORDER — DIPHENHYDRAMINE HCL 50 MG/ML IJ SOLN: 12.5000 mg | Freq: Four times a day (QID) | INTRAMUSCULAR | Status: DC | PRN

## 2016-07-29 MED ORDER — HEPARIN SODIUM (PORCINE) 5000 UNIT/ML IJ SOLN
5000.0000 [IU] | Freq: Three times a day (TID) | INTRAMUSCULAR | Status: DC
  Administered 2016-07-29 – 2016-07-30 (×6): 5000 [IU] via SUBCUTANEOUS
  Filled 2016-07-29 (×6): qty 1

## 2016-07-29 MED ORDER — HYDRALAZINE HCL 20 MG/ML IJ SOLN
5.0000 mg | INTRAMUSCULAR | Status: DC | PRN
  Filled 2016-07-29: qty 0.25

## 2016-07-29 MED ORDER — GADOBENATE DIMEGLUMINE 529 MG/ML IV SOLN
20.0000 mL | Freq: Once | INTRAVENOUS | Status: AC | PRN
  Administered 2016-07-29: 19:00:00 20 mL via INTRAVENOUS

## 2016-07-29 MED ORDER — ACETAMINOPHEN 325 MG PO TABS
650.0000 mg | ORAL_TABLET | Freq: Four times a day (QID) | ORAL | Status: DC | PRN
  Administered 2016-07-30: 650 mg via ORAL
  Filled 2016-07-29: qty 2

## 2016-07-29 MED ORDER — PIPERACILLIN-TAZOBACTAM 3.375 G IVPB
3.3750 g | Freq: Three times a day (TID) | INTRAVENOUS | Status: DC
  Administered 2016-07-29 – 2016-08-05 (×21): 3.375 g via INTRAVENOUS
  Filled 2016-07-29 (×24): qty 50

## 2016-07-29 MED ORDER — VANCOMYCIN HCL 10 G IV SOLR
2500.0000 mg | Freq: Once | INTRAVENOUS | Status: AC
  Administered 2016-07-29: 2500 mg via INTRAVENOUS
  Filled 2016-07-29: qty 2000

## 2016-07-29 MED ORDER — CYCLOBENZAPRINE HCL 5 MG PO TABS
5.0000 mg | ORAL_TABLET | Freq: Three times a day (TID) | ORAL | Status: DC | PRN
  Administered 2016-07-29 – 2016-07-30 (×3): 5 mg via ORAL
  Filled 2016-07-29 (×4): qty 1

## 2016-07-29 MED ORDER — ADULT MULTIVITAMIN W/MINERALS CH
1.0000 | ORAL_TABLET | Freq: Two times a day (BID) | ORAL | Status: DC
  Administered 2016-07-29 – 2016-08-12 (×28): 1 via ORAL
  Filled 2016-07-29 (×28): qty 1

## 2016-07-29 MED ORDER — INSULIN GLARGINE 100 UNIT/ML ~~LOC~~ SOLN
18.0000 [IU] | Freq: Two times a day (BID) | SUBCUTANEOUS | Status: DC
Start: 2016-07-29 — End: 2016-07-29
  Filled 2016-07-29 (×2): qty 0.18

## 2016-07-29 MED ORDER — ZOLPIDEM TARTRATE 5 MG PO TABS: 5.0000 mg | ORAL_TABLET | Freq: Every evening | ORAL | Status: DC | PRN

## 2016-07-29 MED ORDER — ONDANSETRON HCL 4 MG PO TABS: 4.0000 mg | ORAL_TABLET | Freq: Four times a day (QID) | ORAL | Status: DC | PRN

## 2016-07-29 MED ORDER — INSULIN ASPART 100 UNIT/ML ~~LOC~~ SOLN
0.0000 [IU] | Freq: Three times a day (TID) | SUBCUTANEOUS | Status: DC
  Administered 2016-08-01: 2 [IU] via SUBCUTANEOUS
  Administered 2016-08-02 (×2): 1 [IU] via SUBCUTANEOUS
  Administered 2016-08-03 (×2): 2 [IU] via SUBCUTANEOUS
  Administered 2016-08-03 – 2016-08-13 (×21): 1 [IU] via SUBCUTANEOUS

## 2016-07-29 MED ORDER — ONDANSETRON HCL 4 MG/2ML IJ SOLN
4.0000 mg | Freq: Four times a day (QID) | INTRAMUSCULAR | Status: DC | PRN
  Administered 2016-07-31 – 2016-08-13 (×3): 4 mg via INTRAVENOUS

## 2016-07-29 MED ORDER — ACETAMINOPHEN 650 MG RE SUPP: 650.0000 mg | Freq: Four times a day (QID) | RECTAL | Status: DC | PRN

## 2016-07-29 MED ORDER — ASPIRIN EC 81 MG PO TBEC
81.0000 mg | DELAYED_RELEASE_TABLET | Freq: Every day | ORAL | Status: DC
  Administered 2016-07-29 – 2016-08-14 (×15): 81 mg via ORAL
  Filled 2016-07-29 (×15): qty 1

## 2016-07-29 MED ORDER — DEXTROSE 50 % IV SOLN
INTRAVENOUS | Status: AC
  Administered 2016-07-29: 50 mL
  Filled 2016-07-29: qty 50

## 2016-07-29 MED ORDER — ATORVASTATIN CALCIUM 40 MG PO TABS
40.0000 mg | ORAL_TABLET | Freq: Every day | ORAL | Status: DC
  Administered 2016-07-29 – 2016-08-13 (×16): 40 mg via ORAL
  Filled 2016-07-29 (×18): qty 1

## 2016-07-29 MED ORDER — GEMFIBROZIL 600 MG PO TABS
600.0000 mg | ORAL_TABLET | Freq: Two times a day (BID) | ORAL | Status: DC
  Administered 2016-07-30 – 2016-08-14 (×30): 600 mg via ORAL
  Filled 2016-07-29 (×34): qty 1

## 2016-07-29 MED ORDER — VITAMIN B-12 1000 MCG PO TABS
5000.0000 ug | ORAL_TABLET | Freq: Every day | ORAL | Status: DC
  Administered 2016-07-29 – 2016-08-14 (×14): 5000 ug via ORAL
  Filled 2016-07-29 (×15): qty 5

## 2016-07-29 MED ORDER — SODIUM CHLORIDE 0.9 % IV BOLUS (SEPSIS)
2000.0000 mL | Freq: Once | INTRAVENOUS | Status: AC
  Administered 2016-07-29: 02:00:00 2000 mL via INTRAVENOUS
  Administered 2016-07-29: 1000 mL via INTRAVENOUS

## 2016-07-29 MED ORDER — SODIUM CHLORIDE 0.9 % IV SOLN
1250.0000 mg | INTRAVENOUS | Status: DC
  Administered 2016-07-29 – 2016-08-02 (×5): 1250 mg via INTRAVENOUS
  Filled 2016-07-29 (×5): qty 1250

## 2016-07-29 MED ORDER — CEFEPIME HCL 2 G IJ SOLR
2.0000 g | Freq: Two times a day (BID) | INTRAMUSCULAR | Status: DC
  Administered 2016-07-29: 2 g via INTRAVENOUS
  Filled 2016-07-29: qty 2

## 2016-07-29 NOTE — Progress Notes (Signed)
VASCULAR LAB PRELIMINARY  ARTERIAL  ABI completed:    RIGHT    LEFT    PRESSURE WAVEFORM  PRESSURE WAVEFORM  BRACHIAL 119 Triphasic BRACHIAL 114 Triphasic  DP 102 Triphasic DP 121 Triphasic  PT 119 Triphasic PT 121 Triphasic    RIGHT LEFT  ABI 1.0 1.02   ABIs and Doppler waveforms indicate normal arterial flow bilaterally at rest.  Oleva Koo, RVS 07/29/2016, 4:32 PM

## 2016-07-29 NOTE — Progress Notes (Signed)
Pharmacy Antibiotic Note  [name] is a 46 y.o. male c/o swelling and foul odor of right foot admitted on 07/28/2016 with osteomyelitis.  Pharmacy has been consulted for vancomycin dosing.  Plan: ZEI (MD) Vancomycin 2500 mg x1 then 1250 mg IV q24h  VT=15-20 mg/L Daily Scr F/u cultures/levels  Height: 6' (182.9 cm) Weight: 262 lb (118.8 kg) IBW/kg (Calculated) : 77.6  Temp (24hrs), Avg:99 F (37.2 C), Min:98.2 F (36.8 C), Max:99.8 F (37.7 C)   Recent Labs Lab 07/23/16 1703 07/28/16 1844 07/28/16 2315  WBC 13.9* 22.7*  --   CREATININE 1.86* 2.73*  --   LATICACIDVEN  --   --  0.98    Estimated Creatinine Clearance: 45 mL/min (A) (by C-G formula based on SCr of 2.73 mg/dL (H)).    No Known Allergies  Antimicrobials this admission: 7/10 cefepime >> x1 Ed 7/10 zosyn >> 7/10 vancomycin >>   Dose adjustments this admission:   Microbiology results:  BCx:   UCx:    Sputum:    MRSA PCR:   Thank you for allowing pharmacy to be a part of this patient's care.  Dorrene German 07/29/2016 12:07 AM

## 2016-07-29 NOTE — Progress Notes (Signed)
Inpatient Diabetes Program Recommendations  AACE/ADA: New Consensus Statement on Inpatient Glycemic Control (2015)  Target Ranges:  Prepandial:   less than 140 mg/dL      Peak postprandial:   less than 180 mg/dL (1-2 hours)      Critically ill patients:  140 - 180 mg/dL   Results for [name], [name] (MRN [id number]) as of 07/29/2016 12:45  Ref. Range 07/29/2016 01:53 07/29/2016 08:10 07/29/2016 10:49  Glucose-Capillary Latest Ref Range: 65 - 99 mg/dL 74 64 (L) 90   Results for [name], [name] (MRN [id number]) as of 07/29/2016 12:45  Ref. Range 05/10/2015 08:11  Hemoglobin A1C Unknown 6.4   Review of Glycemic Control  Diabetes history: DM2 Outpatient Diabetes medications: Lantus 26 units QAM, Lantus 24 units QHS, Humalog 10 units BID, Januvia 50 mg daily Current orders for Inpatient glycemic control: Novolog 0-9 units TID with meals  Inpatient Diabetes Program Recommendations: Correction (SSI): Please consider ordering Novolog 0-5 units QHS for bedtime correction scale. HgbA1C: A1C in process.  NOTE: Noted consult for diabetes coordinator. Chart reviewed. Per chart, noted patient is [facility] patient and last seen [facility] CM on 11/22/2015. Per note by Trecia Rogers, RN, CM on 11/22/15 patient's weight was 316 lbs and patient was taking Lantus 80 units BID, Novolog 20-30 units BID with meals, and Metformin 1000 mg for DM control at that time. CBGs since admitted have ranged from 64-90 mg/dl and patient has not received any insulin. Recommend adding Novolog bedtime correction scale. Will continue to follow along and make further recommendations if needed as more data is collected.  Thanks, Barnie Alderman, RN, MSN, CDE Diabetes Coordinator Inpatient Diabetes Program [phone] (Team Pager from 8am to 5pm)

## 2016-07-29 NOTE — Progress Notes (Signed)
VASCULAR LAB PRELIMINARY  PRELIMINARY  PRELIMINARY  PRELIMINARY  Right lower extremity venous duplex completed.    Preliminary report:  Right:  No evidence of DVT, superficial thrombosis, or Baker's cyst.  Lonney Revak, RVS 07/29/2016, 9:42 AM

## 2016-07-29 NOTE — Progress Notes (Addendum)
PROGRESS NOTE    [name]  NGE:[id number] DOB: 1971/01/19 DOA: 07/28/2016 PCP: Heywood Bene, PA-C     Brief Narrative:  46 year old male who presented with right foot wound infection and drainage. Patient is known to have hypertension, dyslipidemia, type 2 diabetes mellitus, obesity, chronic kidney disease stage II. He reports a gradually worsening wound on his right foot for last 20 days. Initially seen by his primary care provider on June 6 and started on doxycycline, and posteriorly on Bactrim. Patient developed persistent symptoms, worsening wound and pus drainage, associated with edema and difficulty walking, which prompted him to come to the emergency room for elevation. On his initial physical examination his blood pressure 105/69, heart rate 83, respiratory rate 17, oxygen saturation 99%. Moist mucous membranes, lungs clear to auscultation, heart S1-S2 present rhythmic, the abdomen was protuberant but no organomegaly or peritoneal signs, lower extremity with right ankle edema, positive erythema, ulcerated wound unstageable at the forefoot of the plantar region on the right, full smelling and positive pus drainage. Sodium 135, potassium 3.5, chloride 101, bicarbonate 24, glucose 76, BUN 39, creatinine 2.73, AST 119, ALT 103, alkaline phosphatase 181, total bilirubin's 1.0, white cell count 22.7, hemoglobin 9.7, hematocrit 28.2, platelets 430, sedimentation rate morning 140, CRP 22.9.  Urinalysis with 6-30 white cells, 100 protein. Chest x-ray negative for infiltrates.  Foot x-ray with worsening soft tissue ulcer and soft tissue air about the fifth metatarsophalangeal joint, decrease density of the fifth metatarsal head suspicion for osteomyelitis. EKG with normal sinus rhythm    Assessment & Plan:   Principal Problem:   Diabetic foot infection ([place]) Active Problems:   Diabetes mellitus without complication ([place])   Dyslipidemia   HTN (hypertension)   Morbid obesity ([place])  Diabetic foot ulcer with osteomyelitis ([place])   Acute renal failure superimposed on stage 2 chronic kidney disease ([facility])   Anemia   1. Right foot osteomyelitis. Will continue antibiotic therapy with Vancomycin and Zosyn, pain control and IV fluids, will order MRI of the right foot and will consult orthopedics, patient may need surgical intervention.   2. Type 2 diabetes mellitus. Will continue glucose cover and monitoring with sliding scale, patient tolerating po well.   3. AKI on Chronic kidney disease stage 2. Serum cr stable, will avoid nephrotoxic medications or hypotension, follow vancomycin levels per pharmacy protocol. Serum cr at 2,73 with K at 3,5 and cl at 101. Will continue gentle hydration and follow renal panel in am.   4. Dyslipidemia. Continue statin therapy, and gemfibrozil.   DVT prophylaxis: enoxaparin Code Status: full  Family Communication: I spoke with patient's wife at the bedside and all questions were addressed Disposition Plan: home    Consultants:   Orthopedica  Procedures:    Antimicrobials:   Vancomycin  Zosyn     Subjective: Had episodes of hypoglycemia, no nausea or vomiting, no chest pain. No significant foot pain.   Objective: Vitals:   07/29/16 0030 07/29/16 0100 07/29/16 0145 07/29/16 0643  BP: 104/65 109/70 (!) 101/58 (!) 95/56  Pulse: 83 88 85 84  Resp: 11 11 14 16   Temp:   98.3 F (36.8 C) 98.9 F (37.2 C)  TempSrc:   Oral Oral  SpO2: 100% 100% 100% 100%  Weight:      Height:        Intake/Output Summary (Last 24 hours) at 07/29/16 1139 Last data filed at 07/29/16 1100  Gross per 24 hour  Intake  4320 ml  Output              575 ml  Net             3745 ml   Filed Weights   07/28/16 1831  Weight: 118.8 kg (262 lb)    Examination:  General exam:  No in pain or dyspnea E ENT. No pallor or icterus, oral mucosa moist.  Respiratory system: Clear to auscultation. Respiratory effort normal. No wheezing, rales  or rhonchi.  Cardiovascular system: S1 & S2 heard, RRR. No JVD, murmurs, rubs, gallops or clicks. No pedal edema. Gastrointestinal system: Abdomen is nondistended, soft and nontender. No organomegaly or masses felt. Normal bowel sounds heard. Central nervous system: Alert and oriented. No focal neurological deficits. Extremities: Symmetric 5 x 5 power. Skin: 2 cm round unable to stage ulcerated lesion, with erythema and edema, no significant tenderness.      Data Reviewed: I have personally reviewed following labs and imaging studies  CBC:  Recent Labs Lab 07/23/16 1703 07/28/16 1844  WBC 13.9* 22.7*  NEUTROABS 11.4* 18.6*  HGB 10.3* 9.7*  HCT 31.2* 28.2*  MCV 77.8* 75.2*  PLT 382 509*   Basic Metabolic Panel:  Recent Labs Lab 07/23/16 1703 07/28/16 1844  NA 135 135  K 4.3 3.5  CL 102 101  CO2 24 24  GLUCOSE 134* 76  BUN 42* 39*  CREATININE 1.86* 2.73*  CALCIUM 9.3 9.2   GFR: Estimated Creatinine Clearance: 45 mL/min (A) (by C-G formula based on SCr of 2.73 mg/dL (H)). Liver Function Tests:  Recent Labs Lab 07/28/16 1844  AST 119*  ALT 103*  ALKPHOS 181*  BILITOT 1.0  PROT 8.7*  ALBUMIN 3.4*    Recent Labs Lab 07/28/16 1844  LIPASE 18   No results for input(s): AMMONIA in the last 168 hours. Coagulation Profile:  Recent Labs Lab 07/28/16 2304  INR 1.15   Cardiac Enzymes: No results for input(s): CKTOTAL, CKMB, CKMBINDEX, TROPONINI in the last 168 hours. BNP (last 3 results) No results for input(s): PROBNP in the last 8760 hours. HbA1C: No results for input(s): HGBA1C in the last 72 hours. CBG:  Recent Labs Lab 07/29/16 0153 07/29/16 0810 07/29/16 1049  GLUCAP 74 64* 90   Lipid Profile: No results for input(s): CHOL, HDL, LDLCALC, TRIG, CHOLHDL, LDLDIRECT in the last 72 hours. Thyroid Function Tests: No results for input(s): TSH, T4TOTAL, FREET4, T3FREE, THYROIDAB in the last 72 hours. Anemia Panel:  Recent Labs  07/29/16 0226    VITAMINB12 4,656*  FOLATE 13.6  FERRITIN 705*  TIBC 178*  IRON 9*  RETICCTPCT 0.7   Sepsis Labs:  Recent Labs Lab 07/28/16 2315  LATICACIDVEN 0.98    No results found for this or any previous visit (from the past 240 hour(s)).       Radiology Studies: Dg Chest 2 View  Result Date: 07/28/2016 CLINICAL DATA:  Dizziness and vomiting.  Sepsis protocol. EXAM: CHEST  2 VIEW COMPARISON:  Radiographs 09/08/2006 FINDINGS: Lung volumes are low. The cardiomediastinal contours are normal. The lungs are clear. Pulmonary vasculature is normal. No consolidation, pleural effusion, or pneumothorax. No acute osseous abnormalities are seen. IMPRESSION: Low lung volumes without acute abnormality. Electronically Signed   By: Jeb Levering M.D.   On: 07/28/2016 23:26   Dg Foot Complete Right  Result Date: 07/28/2016 CLINICAL DATA:  Diabetic foot ulcer. EXAM: RIGHT FOOT COMPLETE - 3+ VIEW COMPARISON:  Radiographs 07/23/2016 FINDINGS: Increased mottled soft tissue air an increased  size of soft tissue ulcer about the fifth metatarsal phalangeal joint. Decreased density in the fifth metatarsal head suspicious for acute osteomyelitis. No additional sites concerning for osteomyelitis. There are hammertoe deformity of the digits. Curvilinear density in the plantar soft tissues subjacent to the tarsal metatarsal joints may be calcification or foreign body, unchanged from prior exam. IMPRESSION: Worsening soft tissue ulcer and soft tissue air about the fifth metatarsal phalangeal joint. Decreased density of the fifth metatarsal head suspicious for osteomyelitis. Calcification versus chronic linear foreign body in the plantar soft tissues subjacent to the midfoot. Electronically Signed   By: Jeb Levering M.D.   On: 07/28/2016 23:29        Scheduled Meds: . aspirin EC  81 mg Oral Daily  . atorvastatin  40 mg Oral q1800  . gemfibrozil  600 mg Oral BID AC  . heparin  5,000 Units Subcutaneous Q8H  .  insulin aspart  0-9 Units Subcutaneous TID WC  . multivitamin with minerals  1 tablet Oral BID  . omega-3 acid ethyl esters  1 g Oral Daily  . vitamin B-12  5,000 mcg Oral Daily   Continuous Infusions: . piperacillin-tazobactam (ZOSYN)  IV 3.375 g (07/29/16 0625)  . vancomycin       LOS: 0 days       Tawni Millers, MD [facility] Pager [phone]  If 7PM-7AM, please contact night-coverage [email or web address] Password [email or web address] 07/29/2016, 11:39 AM

## 2016-07-29 NOTE — Consult Note (Signed)
WOC consult requested for right foot wound.  X-ray reads; "Worsening soft tissue ulcer and soft tissue air about the fifth metatarsal phalangeal joint. Decreased density of the fifth metatarsal head suspicious for osteomyelitis."  This complex medical condition is beyond the scope of [facility] nursing.  Called primary team to request ortho consult for further plan of care. Please re-consult if further assistance is needed.  Thank-you,  Julien Girt MSN, [place], [place], [place], [place]

## 2016-07-29 NOTE — Consult Note (Signed)
Reason for Consult:r foot ulcer Referring Physician: hospitalists  [name] is an 46 y.o. male.  HPI: The patient is a 46 year old male with three-week history of noticing a blister type lesion under the little toe metatarsal head.  He noted that was a little bit squishy and boggy.  He presented to the emergency room because of swelling and pain.  He said that the leg is been swollen and a little bit red over the last couple of days.  He was having difficulty walking the day prior to admission.  He was noted to have an abscess and plain x-rays concerning for osteomyelitis.  We are consult it for evaluation and management.  Past Medical History:  Diagnosis Date  . CKD (chronic kidney disease), stage II   . Diabetes mellitus without complication ([facility])   . Hyperlipidemia   . Hypertension   . Obesity     Past Surgical History:  Procedure Laterality Date  . ANAL FISSURE REPAIR  2000  . CHOLECYSTECTOMY    . Circumcisum  2006  . wisdom teeth extracted  1984    Family History  Problem Relation Age of Onset  . Diabetes Father     Social History:  reports that he has never smoked. He has never used smokeless tobacco. He reports that he does not drink alcohol. His drug history is not on file.  Allergies: No Known Allergies  Medications: I have reviewed the patient's current medications.  Results for orders placed or performed during the hospital encounter of 07/28/16 (from the past 48 hour(s))  Lipase, blood     Status: None   Collection Time: 07/28/16  6:44 PM  Result Value Ref Range   Lipase 18 11 - 51 U/L  Comprehensive metabolic panel     Status: Abnormal   Collection Time: 07/28/16  6:44 PM  Result Value Ref Range   Sodium 135 135 - 145 mmol/L   Potassium 3.5 3.5 - 5.1 mmol/L   Chloride 101 101 - 111 mmol/L   CO2 24 22 - 32 mmol/L   Glucose, Bld 76 65 - 99 mg/dL   BUN 39 (H) 6 - 20 mg/dL   Creatinine, Ser 2.73 (H) 0.61 - 1.24 mg/dL   Calcium 9.2 8.9 - 10.3 mg/dL    Total Protein 8.7 (H) 6.5 - 8.1 g/dL   Albumin 3.4 (L) 3.5 - 5.0 g/dL   AST 119 (H) 15 - 41 U/L   ALT 103 (H) 17 - 63 U/L   Alkaline Phosphatase 181 (H) 38 - 126 U/L   Total Bilirubin 1.0 0.3 - 1.2 mg/dL   GFR calc non Af Amer 26 (L) >60 mL/min   GFR calc Af Amer 30 (L) >60 mL/min    Comment: (NOTE) The eGFR has been calculated using the CKD EPI equation. This calculation has not been validated in all clinical situations. eGFR's persistently <60 mL/min signify possible Chronic Kidney Disease.    Anion gap 10 5 - 15  CBC     Status: Abnormal   Collection Time: 07/28/16  6:44 PM  Result Value Ref Range   WBC 22.7 (H) 4.0 - 10.5 K/uL   RBC 3.75 (L) 4.22 - 5.81 MIL/uL   Hemoglobin 9.7 (L) 13.0 - 17.0 g/dL   HCT 28.2 (L) 39.0 - 52.0 %   MCV 75.2 (L) 78.0 - 100.0 fL   MCH 25.9 (L) 26.0 - 34.0 pg   MCHC 34.4 30.0 - 36.0 g/dL   RDW 13.3 11.5 - 15.5 %  Platelets 430 (H) 150 - 400 K/uL  Differential     Status: Abnormal   Collection Time: 07/28/16  6:44 PM  Result Value Ref Range   Neutrophils Relative % 86 %   Neutro Abs 18.6 (H) 1.7 - 7.7 K/uL   Lymphocytes Relative 4 %   Lymphs Abs 1.0 0.7 - 4.0 K/uL   Monocytes Relative 10 %   Monocytes Absolute 2.1 (H) 0.1 - 1.0 K/uL   Eosinophils Relative 0 %   Eosinophils Absolute 0.0 0.0 - 0.7 K/uL   Basophils Relative 0 %   Basophils Absolute 0.0 0.0 - 0.1 K/uL  Urinalysis, Routine w reflex microscopic     Status: Abnormal   Collection Time: 07/28/16  6:46 PM  Result Value Ref Range   Color, Urine AMBER (A) YELLOW    Comment: BIOCHEMICALS MAY BE AFFECTED BY COLOR   APPearance CLOUDY (A) CLEAR   Specific Gravity, Urine 1.021 1.005 - 1.030   pH 5.0 5.0 - 8.0   Glucose, UA NEGATIVE NEGATIVE mg/dL   Hgb urine dipstick NEGATIVE NEGATIVE   Bilirubin Urine NEGATIVE NEGATIVE   Ketones, ur NEGATIVE NEGATIVE mg/dL   Protein, ur 100 (A) NEGATIVE mg/dL   Nitrite NEGATIVE NEGATIVE   Leukocytes, UA NEGATIVE NEGATIVE   RBC / HPF 0-5 0 - 5  RBC/hpf   WBC, UA 6-30 0 - 5 WBC/hpf   Bacteria, UA RARE (A) NONE SEEN   Squamous Epithelial / LPF 0-5 (A) NONE SEEN   Mucous PRESENT    Hyaline Casts, UA PRESENT    Ca Oxalate Crys, UA PRESENT   Protime-INR     Status: None   Collection Time: 07/28/16 11:04 PM  Result Value Ref Range   Prothrombin Time 14.7 11.4 - 15.2 seconds   INR 1.15   Sedimentation rate     Status: Abnormal   Collection Time: 07/28/16 11:06 PM  Result Value Ref Range   Sed Rate >140 (H) 0 - 16 mm/hr  Wound or Superficial Culture     Status: None (Preliminary result)   Collection Time: 07/28/16 11:06 PM  Result Value Ref Range   Specimen Description FOOT RIGHT    Special Requests NONE    Gram Stain      NO WBC SEEN FEW GRAM POSITIVE COCCI FEW GRAM NEGATIVE RODS Performed at [facility], [address], [place], [place] [address]    Culture PENDING    Report Status PENDING   I-Stat CG4 Lactic Acid, ED     Status: None   Collection Time: 07/28/16 11:15 PM  Result Value Ref Range   Lactic Acid, Venous 0.98 0.5 - 1.9 mmol/L  Creatinine, urine, random     Status: None   Collection Time: 07/29/16  1:05 AM  Result Value Ref Range   Creatinine, Urine 232.17 mg/dL    Comment: Performed at [facility], [place] [address] [place] [address] [place], [place] [address]  Glucose, capillary     Status: None   Collection Time: 07/29/16  1:53 AM  Result Value Ref Range   Glucose-Capillary 74 65 - 99 mg/dL  Vitamin B12     Status: Abnormal   Collection Time: 07/29/16  2:26 AM  Result Value Ref Range   Vitamin B-12 4,656 (H) 180 - 914 pg/mL    Comment: (NOTE) This assay is not validated for testing neonatal or myeloproliferative syndrome specimens for Vitamin B12 levels. Performed at [facility], [place] [address], [place], [place] [address]  Folate     Status: None   Collection Time: 07/29/16  2:26 AM  Result Value Ref Range   Folate 13.6 >5.9 ng/mL    Comment: Performed at [facility], [place] [address], [place], [place] [address]  Iron and TIBC     Status: Abnormal   Collection Time: 07/29/16  2:26 AM  Result Value Ref Range   Iron 9 (L) 45 - 182 ug/dL   TIBC 178 (L) 250 - 450 ug/dL   Saturation Ratios 5 (L) 17.9 - 39.5 %   UIBC 169 ug/dL    Comment: Performed at [facility], [place] [address] [place], [place] [address]  Ferritin     Status: Abnormal   Collection Time: 07/29/16  2:26 AM  Result Value Ref Range   Ferritin 705 (H) 24 - 336 ng/mL    Comment: Performed at [facility], [place] [address], [place], [place] [address]  Reticulocytes     Status: Abnormal   Collection Time: 07/29/16  2:26 AM  Result Value Ref Range   Retic Ct Pct 0.7 0.4 - 3.1 %   RBC. 3.48 (L) 4.22 - 5.81 MIL/uL   Retic Count, Absolute 24.4 19.0 - 186.0 K/uL  C-reactive protein     Status: Abnormal   Collection Time: 07/29/16  2:26 AM  Result Value Ref Range   CRP 22.9 (H) <1.0 mg/dL    Comment: Performed at [facility] [address], [place], [place] [address]  Prealbumin     Status: Abnormal   Collection Time: 07/29/16  2:26 AM  Result Value Ref Range   Prealbumin 10.1 (L) 18 - 38 mg/dL    Comment: Performed at [facility] [address], [place], [place] [address]  Glucose, capillary     Status: Abnormal   Collection Time: 07/29/16  8:10 AM  Result Value Ref Range   Glucose-Capillary 64 (L) 65 - 99 mg/dL  Glucose, capillary     Status: None   Collection Time: 07/29/16 10:49 AM  Result Value Ref Range   Glucose-Capillary 90 65 - 99 mg/dL    Dg Chest 2 View  Result Date: 07/28/2016 CLINICAL DATA:  Dizziness and vomiting.  Sepsis protocol. EXAM: CHEST  2 VIEW COMPARISON:  Radiographs 09/08/2006 FINDINGS: Lung volumes are low. The cardiomediastinal contours are normal. The lungs are clear. Pulmonary vasculature is normal. No consolidation, pleural effusion, or pneumothorax. No acute osseous abnormalities are seen. IMPRESSION: Low lung volumes without acute abnormality.  Electronically Signed   By: Jeb Levering M.D.   On: 07/28/2016 23:26   Dg Foot Complete Right  Result Date: 07/28/2016 CLINICAL DATA:  Diabetic foot ulcer. EXAM: RIGHT FOOT COMPLETE - 3+ VIEW COMPARISON:  Radiographs 07/23/2016 FINDINGS: Increased mottled soft tissue air an increased size of soft tissue ulcer about the fifth metatarsal phalangeal joint. Decreased density in the fifth metatarsal head suspicious for acute osteomyelitis. No additional sites concerning for osteomyelitis. There are hammertoe deformity of the digits. Curvilinear density in the plantar soft tissues subjacent to the tarsal metatarsal joints may be calcification or foreign body, unchanged from prior exam. IMPRESSION: Worsening soft tissue ulcer and soft tissue air about the fifth metatarsal phalangeal joint. Decreased density of the fifth metatarsal head suspicious for osteomyelitis. Calcification versus chronic linear foreign body in the plantar soft tissues subjacent to the midfoot. Electronically Signed   By: Jeb Levering M.D.   On: 07/28/2016 23:29    ROS  ROS: I have reviewed the patient's review of systems thoroughly and there are no positive responses as relates to the HPI. Blood pressure (!) 95/56, pulse 84, temperature 98.9 F (37.2 C), temperature source Oral, resp. rate 16, height 6' (1.829 m), weight 118.8 kg (262 lb), SpO2 100 %. Physical Exam Well-developed well-nourished patient in no acute distress. Alert and oriented x3 HEENT:within normal limits Cardiac: Regular rate and rhythm Pulmonary: Lungs clear to auscultation Abdomen: Soft and nontender.  Normal active bowel sounds  Musculoskeletal: (Right foot has 2 x 2 cm area of bogginess right under the fifth metatarsal head.  It is concerning that there could be fluid underneath this area.  There is erythema on the dorsal aspect of the foot.  There is pain to palpation and an around the ulcerated area.  There is decreased sensation throughout the  foot.) Assessment/Plan: 46 year old male with new onset ulceration over the fifth metatarsal head of the right foot.  Plain x-rays are concerning for osteomyelitis.//We had a long discussion with the patient today.  He is relatively adamant against amputation.  I think we need MRI examination to better assess the extent of osteomyelitis and the extent of abscess if any.  I would continue broad-spectrum antibiotics and I would expect that he would need at least a minimum of debridement of the ulcer and infected tissues.  I did discuss with him the consequences of long-term IV antibiotic therapy including liver and kidney issues.  He still wishes to proceed with this if it is at all reasonable.  I will reassess him once his MRI is accomplished.  Surgery would be Thursday evening or Friday at this point.  Jasmin Winberry L 07/29/2016, 2:02 PM

## 2016-07-29 NOTE — H&P (Addendum)
History and Physical    [name] TDV:[id number] DOB: November 01, 1970 DOA: 07/28/2016  Referring MD/NP/PA:   PCP: Heywood Bene, PA-C   Patient coming from:  The patient is coming from home.  At baseline, pt is independent for most of ADL.   Chief Complaint: right foot wound infection and drainage  HPI: [name] is a 46 y.o. male with medical history significant of hypertension, hyperlipidemia, diabetes mellitus, obesity, CKd-II, who presents with right foot wound infection and drainage.  Pt states that he noted a blister in right foot approximately 20 days ago, which has gradually worsened, and formed a wound with pus draining out. He was seen by PCP on 6/2. He was given prescription for doxycycline. His PCP also did wound culture, which showed resistance to doxycycline, therefore the antibiotics was changed to Bactrim. Patient has been taking Bactrim, without significant improvement. Patient was seen in ED of [facility] again on  07/23/16, and had X-ray of right foot, which did not show bone involvement. Patient was instructed to continue to take Bactrim. Patient states that his right foot wound infection has worsened gradually, with pus drainage. He does not have pain. No fever or chills. His right lower leg has swelling and is moderately tender. Patient has dizziness, nausea and vomited twice today. Denies abdominal pain or diarrhea. Patient does not have chest pain, SOB, cough, symptoms of UTI or unilateral weakness.  ED Course: pt was found to have WBC 22.7, lactate 0.98, INR 1.15, worsening renal function, temperature normal, no tachycardia, oxygen saturation 99% on room air, negative chest x-ray. pt is admitted to [place] bed as inpatient.  # X-ray of right foot showed worsening soft tissue ulcer and soft tissue air about the fifth  metatarsal phalangeal joint. Decreased density of the fifth metatarsal head suspicious for osteomyelitis.  Review of Systems:   General: no fevers,  chills, no changes in body weight, has fatigue HEENT: no blurry vision, hearing changes or sore throat Respiratory: no dyspnea, coughing, wheezing CV: no chest pain, no palpitations GI: has nausea, vomiting, no abdominal pain, diarrhea, constipation GU: no dysuria, burning on urination, increased urinary frequency, hematuria  Ext: has right lower leg edema Neuro: no unilateral weakness, numbness, or tingling, no vision change or hearing loss Skin: pt has open ulcer in the lateral side of right foot, with foul smelling pus drainage. Also has swelling and tenderness in right lower leg. MSK: No muscle spasm, no deformity, no limitation of range of movement in spin Heme: No easy bruising.  Travel history: No recent long distant travel.  Allergy: No Known Allergies  Past Medical History:  Diagnosis Date  . CKD (chronic kidney disease), stage II   . Diabetes mellitus without complication ([facility])   . Hyperlipidemia   . Hypertension   . Obesity     Past Surgical History:  Procedure Laterality Date  . ANAL FISSURE REPAIR  2000  . CHOLECYSTECTOMY    . Circumcisum  2006  . wisdom teeth extracted  1984    Social History:  reports that he has never smoked. He has never used smokeless tobacco. He reports that he does not drink alcohol. His drug history is not on file.  Family History:  Family History  Problem Relation Age of Onset  . Diabetes Father      Prior to Admission medications   Medication Sig Start Date End Date Taking? Authorizing Provider  aspirin EC 81 MG tablet Take 81 mg by mouth daily.  Yes [provider]  atorvastatin (LIPITOR) 40 MG tablet Take 40 mg by mouth daily.   Yes [provider]  Cyanocobalamin (VITAMIN B-12) 5000 MCG SUBL Place 5,000 mcg under the tongue daily.   Yes [provider]  gemfibrozil (LOPID) 600 MG tablet Take 600 mg by mouth 2 (two) times daily before a meal.    Yes [provider]  hydrochlorothiazide  (MICROZIDE) 12.5 MG capsule Take 12.5 mg by mouth daily.    Yes [provider]  insulin glargine (LANTUS) 100 UNIT/ML injection Inject 24-26 Units into the skin 2 (two) times daily. Pt uses 26 units in the morning and 24 at night.   Yes [provider]  insulin lispro (HUMALOG) 100 UNIT/ML injection Inject 10 Units into the skin 2 (two) times daily.   Yes [provider]  Multiple Vitamin (MULTIVITAMIN WITH MINERALS) TABS tablet Take 1 tablet by mouth 2 (two) times daily.   Yes [provider]  omega-3 acid ethyl esters (LOVAZA) 1 g capsule Take 1 g by mouth daily.   Yes [provider]  ramipril (ALTACE) 5 MG capsule Take 5 mg by mouth daily.   Yes [provider]  sitaGLIPtin (JANUVIA) 50 MG tablet Take 50 mg by mouth daily.   Yes [provider]  sulfamethoxazole-trimethoprim (BACTRIM DS,SEPTRA DS) 800-160 MG tablet Take 1 tablet by mouth 2 (two) times daily. 07/24/16 07/31/16 Yes [provider]    Physical Exam: Vitals:   07/28/16 2335 07/29/16 0000 07/29/16 0030 07/29/16 0100  BP: 105/69 101/62 104/65 109/70  Pulse: 83 79 83 88  Resp: _0 Temp:      TempSrc:      SpO2: 99% 99% 100% 100%  Weight:      Height:       General: Not in acute distress HEENT:       Eyes: PERRL, EOMI, no scleral icterus.       ENT: No discharge from the ears and nose, no pharynx injection, no tonsillar enlargement.        Neck: No JVD, no bruit, no mass felt. Heme: No neck lymph node enlargement. Cardiac: S1/S2, RRR, No murmurs, No gallops or rubs. Respiratory:  No rales, wheezing, rhonchi or rubs. GI: Soft, nondistended, nontender, no rebound pain, no organomegaly, BS present. GU: No hematuria Ext: 1+DP/PT pulse bilaterally. Musculoskeletal: No joint deformities, No joint redness or warmth, no limitation of ROM in spin. Skin: pt has open ulcer in the lateral side of right foot, with foul smelling pus drainage. Also has  swelling and tenderness in right lower leg from foot to knee.  Neuro: Alert, oriented X3, cranial nerves II-XII grossly intact, moves all extremities normally.  Psych: Patient is not psychotic, no suicidal or hemocidal ideation.  Labs on Admission: I have personally reviewed following labs and imaging studies  CBC:  Recent Labs Lab 07/23/16 1703 07/28/16 1844  WBC 13.9* 22.7*  NEUTROABS 11.4* 18.6*  HGB 10.3* 9.7*  HCT 31.2* 28.2*  MCV 77.8* 75.2*  PLT 382 182*   Basic Metabolic Panel:  Recent Labs Lab 07/23/16 1703 07/28/16 1844  NA 135 135  K 4.3 3.5  CL 102 101  CO2 24 24  GLUCOSE 134* 76  BUN 42* 39*  CREATININE 1.86* 2.73*  CALCIUM 9.3 9.2   GFR: Estimated Creatinine Clearance: 45 mL/min (A) (by C-G formula based on SCr of 2.73 mg/dL (H)). Liver Function Tests:  Recent Labs Lab 07/28/16 1844  AST  119*  ALT 103*  ALKPHOS 181*  BILITOT 1.0  PROT 8.7*  ALBUMIN 3.4*    Recent Labs Lab 07/28/16 1844  LIPASE 18   No results for input(s): AMMONIA in the last 168 hours. Coagulation Profile:  Recent Labs Lab 07/28/16 2304  INR 1.15   Cardiac Enzymes: No results for input(s): CKTOTAL, CKMB, CKMBINDEX, TROPONINI in the last 168 hours. BNP (last 3 results) No results for input(s): PROBNP in the last 8760 hours. HbA1C: No results for input(s): HGBA1C in the last 72 hours. CBG: No results for input(s): GLUCAP in the last 168 hours. Lipid Profile: No results for input(s): CHOL, HDL, LDLCALC, TRIG, CHOLHDL, LDLDIRECT in the last 72 hours. Thyroid Function Tests: No results for input(s): TSH, T4TOTAL, FREET4, T3FREE, THYROIDAB in the last 72 hours. Anemia Panel: No results for input(s): VITAMINB12, FOLATE, FERRITIN, TIBC, IRON, RETICCTPCT in the last 72 hours. Urine analysis:    Component Value Date/Time   COLORURINE AMBER (A) 07/28/2016 1846   APPEARANCEUR CLOUDY (A) 07/28/2016 1846   LABSPEC 1.021 07/28/2016 1846   PHURINE 5.0 07/28/2016 1846    GLUCOSEU NEGATIVE 07/28/2016 1846   HGBUR NEGATIVE 07/28/2016 1846   BILIRUBINUR NEGATIVE 07/28/2016 1846   KETONESUR NEGATIVE 07/28/2016 1846   PROTEINUR 100 (A) 07/28/2016 1846   UROBILINOGEN 0.2 01/26/2007 1759   NITRITE NEGATIVE 07/28/2016 1846   LEUKOCYTESUR NEGATIVE 07/28/2016 1846   Sepsis Labs: _0 (procalcitonin:4,lacticidven:4) )No results found for this or any previous visit (from the past 240 hour(s)).   Radiological Exams on Admission: Dg Chest 2 View  Result Date: 07/28/2016 CLINICAL DATA:  Dizziness and vomiting.  Sepsis protocol. EXAM: CHEST  2 VIEW COMPARISON:  Radiographs 09/08/2006 FINDINGS: Lung volumes are low. The cardiomediastinal contours are normal. The lungs are clear. Pulmonary vasculature is normal. No consolidation, pleural effusion, or pneumothorax. No acute osseous abnormalities are seen. IMPRESSION: Low lung volumes without acute abnormality. Electronically Signed   By: Jeb Levering M.D.   On: 07/28/2016 23:26   Dg Foot Complete Right  Result Date: 07/28/2016 CLINICAL DATA:  Diabetic foot ulcer. EXAM: RIGHT FOOT COMPLETE - 3+ VIEW COMPARISON:  Radiographs 07/23/2016 FINDINGS: Increased mottled soft tissue air an increased size of soft tissue ulcer about the fifth metatarsal phalangeal joint. Decreased density in the fifth metatarsal head suspicious for acute osteomyelitis. No additional sites concerning for osteomyelitis. There are hammertoe deformity of the digits. Curvilinear density in the plantar soft tissues subjacent to the tarsal metatarsal joints may be calcification or foreign body, unchanged from prior exam. IMPRESSION: Worsening soft tissue ulcer and soft tissue air about the fifth metatarsal phalangeal joint. Decreased density of the fifth metatarsal head suspicious for osteomyelitis. Calcification versus chronic linear foreign body in the plantar soft tissues subjacent to the midfoot. Electronically Signed   By: Jeb Levering M.D.   On:  07/28/2016 23:29     EKG: Not done in ED, will get one.   Assessment/Plan Principal Problem:   Diabetic foot infection ([place]) Active Problems:   Diabetes mellitus without complication ([facility])   Dyslipidemia   HTN (hypertension)   Morbid obesity ([place] [facility])   Diabetic foot ulcer with osteomyelitis ([place])   Acute renal failure superimposed on stage 2 chronic kidney disease ([facility])   Anemia   Diabetic foot infection and possible osteomyelitis: patient has leukocytosis, but no fever. Lactic acid is normal. Clinically does not meet criteria for sepsis. Currently hemodynamically stable. X-ray of left foot showed a possible osteomyelitis.  - will admit to tele bed as  inpt - Empiric antimicrobial treatment with vancomycin and Zosyn per pharmacy - PRN Zofran for nausea - Blood cultures x 2  - ESR and CRP - wound care consult - will get Procalcitonin and trend lactic acid levels per sepsis protocol. - IVF: 3 L of NS bolus in ED, followed by 100 cc/h - Please consult ortho in AM -get ABI, and right LE venous doppler to r/o DVT  DM-II: Last A1c 6.4 on 05/10/15, well controled. Patient is taking Lantus, Humalog, Januvia at home -will decrease Lantus dose from 26-24 units twice a day to 18 units twice a day   -SSI -Check A1c  HLD:  -lipitor and Lopid  HTN: Blood pressure is soft -Hold HCTZ and ramipril due to worsening renal function -IVF hydralazine when necessary  AoCKD-II: Baseline Cre is 1.1, pt's Cre is 2.73 on admission. Likely due to prerenal secondary to dehydration and continuation of ACEI and diuretics. Bactrim use may have also contributed partially. - IVF as above - Check FeUrea - Follow up renal function by BMP - Hold HCTZ, ramipril, Bactrim  Anemia: hgb 10.3 on 07/23/16 to 9.7 -check Anemia panel  DVT ppx: SQ Heparin    Code Status: Full code Family Communication:  Yes, patient's  wife  at bed side Disposition Plan:  Anticipate discharge back to previous home  environment Consults called:  none Admission status: medical floor/inpt  Date of Service 07/29/2016    Ivor Costa [facility] Pager [phone]  If 7PM-7AM, please contact night-coverage [email or web address] Password [email or web address] 07/29/2016, 1:34 AM

## 2016-07-29 NOTE — Progress Notes (Signed)
CSW consulted to assist with meds at d/c. CSW is unable to assist with this request.   Werner Lean LCSW [phone]

## 2016-07-29 NOTE — Progress Notes (Signed)
Initial Nutrition Assessment  DOCUMENTATION CODES:   Obesity unspecified  INTERVENTION:   Provided examples of appropriate protein supplements for patient to try Encouraged PO intake  NUTRITION DIAGNOSIS:   Increased nutrient needs related to wound healing as evidenced by estimated needs.  GOAL:   Patient will meet greater than or equal to 90% of their needs  MONITOR:   PO intake, Labs, Weight trends, Skin, I & O's  REASON FOR ASSESSMENT:   Consult Wound healing  ASSESSMENT:   46 y.o. male with medical history significant of hypertension, hyperlipidemia, diabetes mellitus, obesity, CKd-II, who presents with right foot wound infection and drainage.  Patient in room with wife at bedside. Pt states he ate 100% of his breakfast of an omelet. Pt states that he has a good appetite but does not eat as consistently as he should d/t his new job. Pt will eat breakfast and lunch but usually won't eat a dinner meal,more like snacks. Encouraged small, consistent meals to help with blood sugar control. Encouraged protein consumption with DM foot ulcer. Pt states he has tried protein supplements before and has options he prefers at home. Does not like Premier Protein or Glucerna which are our options here.   Per chart review, pt has lost 54 lbs since 11/22/15, pt states this is intentional as he was participating in a weight loss program with his wife. Nutrition focused physical exam shows no sign of depletion of muscle mass or body fat.  Labs reviewed. Medications: Multivitamin with minerals daily, Lovaza capsule daily, Vitamin B-12 tablet daily  Diet Order:  Diet heart healthy/carb modified Room service appropriate? Yes; Fluid consistency: Thin  Skin:  Wound (see comment) (DM foot ulcer)  Last BM:  7/9  Height:   Ht Readings from Last 1 Encounters:  07/28/16 6' (1.829 m)    Weight:   Wt Readings from Last 1 Encounters:  07/28/16 262 lb (118.8 kg)    Ideal Body Weight:  80.9  kg  BMI:  Body mass index is 35.53 kg/m.  Estimated Nutritional Needs:   Kcal:  9371-6967  Protein:  105-115g  Fluid:  2.3L/day  EDUCATION NEEDS:   Education needs addressed  [name] Bibles, MS, RD, LDN Pager: [phone] After Hours Pager: [phone]

## 2016-07-30 DIAGNOSIS — M868X7 Other osteomyelitis, ankle and foot: Secondary | ICD-10-CM

## 2016-07-30 DIAGNOSIS — E1122 Type 2 diabetes mellitus with diabetic chronic kidney disease: Secondary | ICD-10-CM

## 2016-07-30 DIAGNOSIS — M7989 Other specified soft tissue disorders: Secondary | ICD-10-CM

## 2016-07-30 LAB — GLUCOSE, CAPILLARY
Glucose-Capillary: 91 mg/dL (ref 65–99)
Glucose-Capillary: 90 mg/dL (ref 65–99)
Glucose-Capillary: 91 mg/dL (ref 65–99)
Glucose-Capillary: 102 mg/dL — ABNORMAL HIGH (ref 65–99)

## 2016-07-30 LAB — CBC WITH DIFFERENTIAL/PLATELET
Basophils Absolute: 0 10*3/uL (ref 0.0–0.1)
Basophils Relative: 0 %
Eosinophils Absolute: 0.2 10*3/uL (ref 0.0–0.7)
Eosinophils Relative: 1 %
HCT: 26.3 % — ABNORMAL LOW (ref 39.0–52.0)
Hemoglobin: 8.8 g/dL — ABNORMAL LOW (ref 13.0–17.0)
Lymphocytes Relative: 11 %
Lymphs Abs: 2.1 10*3/uL (ref 0.7–4.0)
MCH: 25 pg — ABNORMAL LOW (ref 26.0–34.0)
MCHC: 33.5 g/dL (ref 30.0–36.0)
MCV: 74.7 fL — ABNORMAL LOW (ref 78.0–100.0)
Monocytes Absolute: 1.4 10*3/uL — ABNORMAL HIGH (ref 0.1–1.0)
Monocytes Relative: 7 %
Neutro Abs: 15.8 10*3/uL — ABNORMAL HIGH (ref 1.7–7.7)
Neutrophils Relative %: 81 %
Platelets: 432 10*3/uL — ABNORMAL HIGH (ref 150–400)
RBC: 3.52 MIL/uL — ABNORMAL LOW (ref 4.22–5.81)
RDW: 13.6 % (ref 11.5–15.5)
WBC: 19.5 10*3/uL — ABNORMAL HIGH (ref 4.0–10.5)

## 2016-07-30 LAB — BASIC METABOLIC PANEL
Anion gap: 10 (ref 5–15)
BUN: 36 mg/dL — ABNORMAL HIGH (ref 6–20)
CO2: 21 mmol/L — ABNORMAL LOW (ref 22–32)
Calcium: 8.8 mg/dL — ABNORMAL LOW (ref 8.9–10.3)
Chloride: 106 mmol/L (ref 101–111)
Creatinine, Ser: 2.07 mg/dL — ABNORMAL HIGH (ref 0.61–1.24)
GFR calc Af Amer: 43 mL/min — ABNORMAL LOW (ref >60)
GFR calc non Af Amer: 37 mL/min — ABNORMAL LOW (ref >60)
Glucose, Bld: 96 mg/dL (ref 65–99)
Potassium: 4.1 mmol/L (ref 3.5–5.1)
Sodium: 137 mmol/L (ref 135–145)

## 2016-07-30 LAB — HEMOGLOBIN A1C
Hgb A1c MFr Bld: 5.3 % (ref 4.8–5.6)
Mean Plasma Glucose: 105 mg/dL

## 2016-07-30 LAB — UREA NITROGEN, URINE: Urea Nitrogen, Ur: 689 mg/dL

## 2016-07-30 MED ORDER — OXYCODONE-ACETAMINOPHEN 5-325 MG PO TABS
1.0000 | ORAL_TABLET | ORAL | Status: DC | PRN
  Administered 2016-07-30: 1 via ORAL
  Filled 2016-07-30: qty 1

## 2016-07-30 MED ORDER — OXYCODONE-ACETAMINOPHEN 5-325 MG PO TABS
1.0000 | ORAL_TABLET | ORAL | Status: DC | PRN
  Administered 2016-08-01 – 2016-08-08 (×3): 2 via ORAL
  Filled 2016-07-30 (×5): qty 2

## 2016-07-30 NOTE — Progress Notes (Signed)
Subjective: Pt worse today with slightly increased pain and continued swelling and erythemae   Objective: Vital signs in last 24 hours: Temp:  [98.6 F (37 C)-99.4 F (37.4 C)] 98.6 F (37 C) (07/11 1446) Pulse Rate:  [82-91] 82 (07/11 1446) Resp:  [16-17] 16 (07/11 1446) BP: (109-119)/(57-65) 111/65 (07/11 1446) SpO2:  [100 %] 100 % (07/11 1446)  Intake/Output from previous day: 07/10 0701 - 07/11 0700 In: 1210 [P.O.:860; IV Piggyback:350] Out: 1650 [Urine:1650] Intake/Output this shift: No intake/output data recorded.   Recent Labs  07/28/16 1844 07/30/16 0518  HGB 9.7* 8.8*    Recent Labs  07/28/16 1844 07/29/16 0226 07/30/16 0518  WBC 22.7*  --  19.5*  RBC 3.75* 3.48* 3.52*  HCT 28.2*  --  26.3*  PLT 430*  --  432*    Recent Labs  07/28/16 1844 07/29/16 1416 07/30/16 0518  NA 135  --  137  K 3.5  --  4.1  CL 101  --  106  CO2 24  --  21*  BUN 39*  --  36*  CREATININE 2.73* 2.33* 2.07*  GLUCOSE 76  --  96  CALCIUM 9.2  --  8.8*    Recent Labs  07/28/16 2304  INR 1.15  .mri  Neurologically intact ABD soft Neurovascular intact Intact pulses distally Compartment soft redness with some mottled areas over the 4-5 interspace  Assessment/Plan: 46 y/o male with severe infection and plantar ulcer over the bottom of 5th met-phal joint wity MRI showing ulcer and air in soft tissue overlying 4-th met-phal jt.  Botttom line is that the patient has not improved on ABx over 36 hrs and I feel needs 5 ray amputation.  Will keep NPO and at [facility] request I will have Dr. Sharol Given see the patient in the AM.  I have spoken with the patient and his wife at length tonight about the severity of the problem and the risks of surgery including bleeding,infection, failure of wound to heal and need for further surgery.  They understand and wish to proceed with surgery.    Dedric Ethington L 07/30/2016, 7:53 PM

## 2016-07-30 NOTE — Progress Notes (Signed)
PROGRESS NOTE    [name]  DDU:[id number] DOB: 04-10-1970 DOA: 07/28/2016 PCP: Heywood Bene, PA-C    Brief Narrative:  46 year old male who presented with right foot wound infection and drainage. Patient is known to have hypertension, dyslipidemia, type 2 diabetes mellitus, obesity, chronic kidney disease stage II. He reports a gradually worsening wound on his right foot for last 20 days. Initially seen by his primary care provider on June 6 and started on doxycycline, and posteriorly on Bactrim. Patient developed persistent symptoms, worsening wound and pus drainage, associated with edema and difficulty walking, which prompted him to come to the emergency room for elevation. On his initial physical examination his blood pressure 105/69, heart rate 83, respiratory rate 17, oxygen saturation 99%. Moist mucous membranes, lungs clear to auscultation, heart S1-S2 present rhythmic, the abdomen was protuberant but no organomegaly or peritoneal signs, lower extremity with right ankle edema, positive erythema, ulcerated wound unstageable at the forefoot of the plantar region on the right, full smelling and positive pus drainage. Sodium 135, potassium 3.5, chloride 101, bicarbonate 24, glucose 76, BUN 39, creatinine 2.73, AST 119, ALT 103, alkaline phosphatase 181, total bilirubin's 1.0, white cell count 22.7, hemoglobin 9.7, hematocrit 28.2, platelets 430, sedimentation rate morning 140, CRP 22.9.  Urinalysis with 6-30 white cells, 100 protein. Chest x-ray negative for infiltrates.  Foot x-ray with worsening soft tissue ulcer and soft tissue air about the fifth metatarsophalangeal joint, decrease density of the fifth metatarsal head suspicion for osteomyelitis. EKG with normal sinus rhythm    Assessment & Plan:   Principal Problem:   Diabetic foot infection ([place]) Active Problems:   Diabetes mellitus without complication ([place])   Dyslipidemia   HTN (hypertension)   Morbid obesity ([place])  Diabetic foot ulcer with osteomyelitis ([place])   Acute renal failure superimposed on stage 2 chronic kidney disease ([facility])   Anemia   1. Right foot osteomyelitis.  Broad spectrum antibiotic therapy with Vancomycin and Zosyn, follow on cultures and temperature curve. WBC at 19 from 22, positive MRI for osteomyelitis, will follow with surgery recommendations.   2. Type 2 diabetes mellitus.  Glucose cover and monitoring with sliding scale, patient tolerating po well. Capillary glucose 96, 94, 91, 90. Will continue to hold on long acting insulin for now.    3. AKI on Chronic kidney disease stage 2. Serum cr stable, at 2.0. Follow vancomycin per pharmacy protocol. Serum k at 4,1 and bicarbonate at 21, will continue close monitoring of renal function and electrolytes. Holding IV fluids for now.  4. Dyslipidemia. Patient on statin therapy, and gemfibrozil.   DVT prophylaxis: enoxaparin Code Status: full  Family Communication: I spoke with patient's wife at the bedside and all questions were addressed Disposition Plan: home    Consultants:   Orthopedica  Procedures:    Antimicrobials:   Vancomycin  Zosyn      Subjective: Patient with positive pain on his right foot, worse with ambulation, no nausea or vomiting, no diarrhea or chest pain.   Objective: Vitals:   07/29/16 0643 07/29/16 1454 07/29/16 2045 07/30/16 0455  BP: (!) 95/56 (!) 101/57 (!) 109/57 119/63  Pulse: 84 83 91 91  Resp: 16 18 17 17   Temp: 98.9 F (37.2 C) 99.5 F (37.5 C) 99.1 F (37.3 C) 99.4 F (37.4 C)  TempSrc: Oral Oral Oral Oral  SpO2: 100% 100% 100% 100%  Weight:      Height:        Intake/Output Summary (Last 24 hours)  at 07/30/16 1308 Last data filed at 07/30/16 0951  Gross per 24 hour  Intake             1090 ml  Output             1350 ml  Net             -260 ml   Filed Weights   07/28/16 1831  Weight: 118.8 kg (262 lb)    Examination:  General exam: not in pain or  dyspnea E ENT: no pallor or icterus, oral mucosa moist.  Respiratory system: Clear to auscultation. Respiratory effort normal. No wheezing, rales or rhonchi.  Cardiovascular system: S1 & S2 heard, RRR. No JVD, murmurs, rubs, gallops or clicks. No pedal edema. Gastrointestinal system: Abdomen is nondistended, soft and nontender. No organomegaly or masses felt. Normal bowel sounds heard. Central nervous system: Alert and oriented. No focal neurological deficits. Extremities: Symmetric 5 x 5 power. Skin: Right foot plantar region with ulcerated lesion, associated with edema.      Data Reviewed: I have personally reviewed following labs and imaging studies  CBC:  Recent Labs Lab 07/23/16 1703 07/28/16 1844 07/30/16 0518  WBC 13.9* 22.7* 19.5*  NEUTROABS 11.4* 18.6* 15.8*  HGB 10.3* 9.7* 8.8*  HCT 31.2* 28.2* 26.3*  MCV 77.8* 75.2* 74.7*  PLT 382 430* 409*   Basic Metabolic Panel:  Recent Labs Lab 07/23/16 1703 07/28/16 1844 07/29/16 1416 07/30/16 0518  NA 135 135  --  137  K 4.3 3.5  --  4.1  CL 102 101  --  106  CO2 24 24  --  21*  GLUCOSE 134* 76  --  96  BUN 42* 39*  --  36*  CREATININE 1.86* 2.73* 2.33* 2.07*  CALCIUM 9.3 9.2  --  8.8*   GFR: Estimated Creatinine Clearance: 59.3 mL/min (A) (by C-G formula based on SCr of 2.07 mg/dL (H)). Liver Function Tests:  Recent Labs Lab 07/28/16 1844  AST 119*  ALT 103*  ALKPHOS 181*  BILITOT 1.0  PROT 8.7*  ALBUMIN 3.4*    Recent Labs Lab 07/28/16 1844  LIPASE 18   No results for input(s): AMMONIA in the last 168 hours. Coagulation Profile:  Recent Labs Lab 07/28/16 2304  INR 1.15   Cardiac Enzymes: No results for input(s): CKTOTAL, CKMB, CKMBINDEX, TROPONINI in the last 168 hours. BNP (last 3 results) No results for input(s): PROBNP in the last 8760 hours. HbA1C:  Recent Labs  07/29/16 0226  HGBA1C 5.3   CBG:  Recent Labs Lab 07/29/16 1445 07/29/16 1752 07/29/16 2043 07/30/16 0737  07/30/16 1219  GLUCAP 88 96 94 91 90   Lipid Profile: No results for input(s): CHOL, HDL, LDLCALC, TRIG, CHOLHDL, LDLDIRECT in the last 72 hours. Thyroid Function Tests: No results for input(s): TSH, T4TOTAL, FREET4, T3FREE, THYROIDAB in the last 72 hours. Anemia Panel:  Recent Labs  07/29/16 0226  VITAMINB12 4,656*  FOLATE 13.6  FERRITIN 705*  TIBC 178*  IRON 9*  RETICCTPCT 0.7   Sepsis Labs:  Recent Labs Lab 07/28/16 2315  LATICACIDVEN 0.98    Recent Results (from the past 240 hour(s))  Culture, blood (Routine x 2)     Status: None (Preliminary result)   Collection Time: 07/28/16 11:06 PM  Result Value Ref Range Status   Specimen Description BLOOD BLOOD RIGHT HAND  Final   Special Requests   Final    BOTTLES DRAWN AEROBIC AND ANAEROBIC Blood Culture results may not be  optimal due to an inadequate volume of blood received in culture bottles   Culture   Final    NO GROWTH 1 DAY Performed at [facility], [place] [address], [place], [place] [address]    Report Status PENDING  Incomplete  Wound or Superficial Culture     Status: None (Preliminary result)   Collection Time: 07/28/16 11:06 PM  Result Value Ref Range Status   Specimen Description FOOT RIGHT  Final   Special Requests NONE  Final   Gram Stain   Final    NO WBC SEEN FEW GRAM POSITIVE COCCI FEW GRAM NEGATIVE RODS    Culture   Final    MODERATE ENTEROCOCCUS FAECALIS CULTURE REINCUBATED FOR BETTER GROWTH Performed at [facility], [place] [address], [place], [place] [address]    Report Status PENDING  Incomplete  Culture, blood (Routine x 2)     Status: None (Preliminary result)   Collection Time: 07/28/16 11:45 PM  Result Value Ref Range Status   Specimen Description BLOOD LEFT ANTECUBITAL  Final   Special Requests   Final    BOTTLES DRAWN AEROBIC AND ANAEROBIC Blood Culture adequate volume   Culture   Final    NO GROWTH 1 DAY Performed at [facility], [place] [address]  [place], [place] [address]    Report Status PENDING  Incomplete         Radiology Studies: Dg Chest 2 View  Result Date: 07/28/2016 CLINICAL DATA:  Dizziness and vomiting.  Sepsis protocol. EXAM: CHEST  2 VIEW COMPARISON:  Radiographs 09/08/2006 FINDINGS: Lung volumes are low. The cardiomediastinal contours are normal. The lungs are clear. Pulmonary vasculature is normal. No consolidation, pleural effusion, or pneumothorax. No acute osseous abnormalities are seen. IMPRESSION: Low lung volumes without acute abnormality. Electronically Signed   By: Jeb Levering M.D.   On: 07/28/2016 23:26   Mr Foot Right W Contrast  Result Date: 07/30/2016 CLINICAL DATA:  Right foot pain.  Ulcer of the fifth toe. EXAM: MRI OF THE RIGHT FOREFOOT WITH CONTRAST TECHNIQUE: Multiplanar, multisequence MR imaging of the right forefoot was performed following the administration of intravenous contrast. CONTRAST:  87mL MULTIHANCE GADOBENATE DIMEGLUMINE 529 MG/ML IV SOLN COMPARISON:  None. FINDINGS: Bones/Joint/Cartilage Soft tissue ulcer overlying the fifth metatarsal with associated soft tissue emphysema along the dorsal aspect. Cortical destruction and severe marrow edema of the fifth metatarsal head. Severe marrow edema of the fifth proximal phalanx with cortical irregularity along the base. Marrow edema in the fifth distal phalanx. No other marrow signal abnormality. No acute fracture dislocation. Normal alignment. No joint effusion. Ligaments Collateral ligaments are intact.  Lisfranc ligament is intact. Muscles and Tendons Flexor, peroneal and extensor compartment tendons are intact. Muscles are normal. Soft tissue Soft tissue swelling and enhancement of the dorsal aspect of the foot concerning for cellulitis. No drainable fluid collection. No soft tissue mass. IMPRESSION: 1. Soft tissue ulcer overlying the fifth metatarsal with surrounding cellulitis and soft tissue emphysema. Osteomyelitis involving the fifth metatarsal  head and base of the fifth proximal phalanx. Mild marrow edema in the fifth distal phalanx likely reactive. 2. No drainable fluid collection to suggest an abscess. Electronically Signed   By: Kathreen Devoid   On: 07/30/2016 08:28   Dg Foot Complete Right  Result Date: 07/28/2016 CLINICAL DATA:  Diabetic foot ulcer. EXAM: RIGHT FOOT COMPLETE - 3+ VIEW COMPARISON:  Radiographs 07/23/2016 FINDINGS: Increased mottled soft tissue air an increased size of soft tissue ulcer  about the fifth metatarsal phalangeal joint. Decreased density in the fifth metatarsal head suspicious for acute osteomyelitis. No additional sites concerning for osteomyelitis. There are hammertoe deformity of the digits. Curvilinear density in the plantar soft tissues subjacent to the tarsal metatarsal joints may be calcification or foreign body, unchanged from prior exam. IMPRESSION: Worsening soft tissue ulcer and soft tissue air about the fifth metatarsal phalangeal joint. Decreased density of the fifth metatarsal head suspicious for osteomyelitis. Calcification versus chronic linear foreign body in the plantar soft tissues subjacent to the midfoot. Electronically Signed   By: Jeb Levering M.D.   On: 07/28/2016 23:29        Scheduled Meds: . aspirin EC  81 mg Oral Daily  . atorvastatin  40 mg Oral q1800  . gemfibrozil  600 mg Oral BID AC  . heparin  5,000 Units Subcutaneous Q8H  . insulin aspart  0-9 Units Subcutaneous TID WC  . multivitamin with minerals  1 tablet Oral BID  . omega-3 acid ethyl esters  1 g Oral Daily  . vitamin B-12  5,000 mcg Oral Daily   Continuous Infusions: . piperacillin-tazobactam (ZOSYN)  IV Stopped (07/30/16 0944)  . vancomycin Stopped (07/29/16 2228)     LOS: 1 day      Tawni Millers, MD [facility] Pager [phone]  If 7PM-7AM, please contact night-coverage [email or web address] Password [email or web address] 07/30/2016, 1:08 PM

## 2016-07-31 ENCOUNTER — Encounter (HOSPITAL_COMMUNITY)
Admission: EM | Disposition: A | Discharge status: Home Health | Payer: Self-pay | Source: Home / Self Care | Attending: Internal Medicine

## 2016-07-31 ENCOUNTER — Inpatient Hospital Stay (HOSPITAL_COMMUNITY): Payer: BLUE CROSS/BLUE SHIELD | Admitting: Anesthesiology

## 2016-07-31 ENCOUNTER — Encounter (HOSPITAL_COMMUNITY): Payer: Self-pay | Admitting: Anesthesiology

## 2016-07-31 DIAGNOSIS — M869 Osteomyelitis, unspecified: Secondary | ICD-10-CM

## 2016-07-31 DIAGNOSIS — E11621 Type 2 diabetes mellitus with foot ulcer: Secondary | ICD-10-CM

## 2016-07-31 DIAGNOSIS — E11628 Type 2 diabetes mellitus with other skin complications: Secondary | ICD-10-CM

## 2016-07-31 DIAGNOSIS — L97509 Non-pressure chronic ulcer of other part of unspecified foot with unspecified severity: Secondary | ICD-10-CM

## 2016-07-31 DIAGNOSIS — L089 Local infection of the skin and subcutaneous tissue, unspecified: Secondary | ICD-10-CM

## 2016-07-31 DIAGNOSIS — E1169 Type 2 diabetes mellitus with other specified complication: Principal | ICD-10-CM

## 2016-07-31 HISTORY — PX: AMPUTATION: SHX166

## 2016-07-31 LAB — CBC WITH DIFFERENTIAL/PLATELET
Basophils Absolute: 0 10*3/uL (ref 0.0–0.1)
Basophils Relative: 0 %
Eosinophils Absolute: 0.2 10*3/uL (ref 0.0–0.7)
Eosinophils Relative: 1 %
HCT: 25.6 % — ABNORMAL LOW (ref 39.0–52.0)
Hemoglobin: 8.6 g/dL — ABNORMAL LOW (ref 13.0–17.0)
Lymphocytes Relative: 11 %
Lymphs Abs: 2.3 10*3/uL (ref 0.7–4.0)
MCH: 25.1 pg — ABNORMAL LOW (ref 26.0–34.0)
MCHC: 33.6 g/dL (ref 30.0–36.0)
MCV: 74.6 fL — ABNORMAL LOW (ref 78.0–100.0)
Monocytes Absolute: 1.5 10*3/uL — ABNORMAL HIGH (ref 0.1–1.0)
Monocytes Relative: 7 %
Neutro Abs: 16.9 10*3/uL — ABNORMAL HIGH (ref 1.7–7.7)
Neutrophils Relative %: 81 %
Platelets: 446 10*3/uL — ABNORMAL HIGH (ref 150–400)
RBC: 3.43 MIL/uL — ABNORMAL LOW (ref 4.22–5.81)
RDW: 13.5 % (ref 11.5–15.5)
WBC: 20.9 10*3/uL — ABNORMAL HIGH (ref 4.0–10.5)

## 2016-07-31 LAB — BASIC METABOLIC PANEL
Anion gap: 8 (ref 5–15)
BUN: 32 mg/dL — ABNORMAL HIGH (ref 6–20)
CO2: 23 mmol/L (ref 22–32)
Calcium: 8.7 mg/dL — ABNORMAL LOW (ref 8.9–10.3)
Chloride: 105 mmol/L (ref 101–111)
Creatinine, Ser: 1.69 mg/dL — ABNORMAL HIGH (ref 0.61–1.24)
GFR calc Af Amer: 54 mL/min — ABNORMAL LOW (ref >60)
GFR calc non Af Amer: 47 mL/min — ABNORMAL LOW (ref >60)
Glucose, Bld: 112 mg/dL — ABNORMAL HIGH (ref 65–99)
Potassium: 4.2 mmol/L (ref 3.5–5.1)
Sodium: 136 mmol/L (ref 135–145)

## 2016-07-31 LAB — GLUCOSE, CAPILLARY
Glucose-Capillary: 96 mg/dL (ref 65–99)
Glucose-Capillary: 98 mg/dL (ref 65–99)
Glucose-Capillary: 103 mg/dL — ABNORMAL HIGH (ref 65–99)
Glucose-Capillary: 146 mg/dL — ABNORMAL HIGH (ref 65–99)
Glucose-Capillary: 150 mg/dL — ABNORMAL HIGH (ref 65–99)

## 2016-07-31 LAB — SURGICAL PCR SCREEN
MRSA, PCR: NEGATIVE
Staphylococcus aureus: NEGATIVE

## 2016-07-31 SURGERY — AMPUTATION, FOOT, RAY
Anesthesia: General | Site: Foot | Laterality: Right

## 2016-07-31 MED ORDER — DEXAMETHASONE SODIUM PHOSPHATE 10 MG/ML IJ SOLN
INTRAMUSCULAR | Status: AC
  Filled 2016-07-31: qty 2

## 2016-07-31 MED ORDER — FENTANYL CITRATE (PF) 100 MCG/2ML IJ SOLN
INTRAMUSCULAR | Status: DC | PRN
  Administered 2016-07-31: 25 ug via INTRAVENOUS
  Administered 2016-07-31: 50 ug via INTRAVENOUS
  Administered 2016-07-31: 25 ug via INTRAVENOUS
  Administered 2016-07-31: 100 ug via INTRAVENOUS

## 2016-07-31 MED ORDER — PROPOFOL 10 MG/ML IV BOLUS
INTRAVENOUS | Status: AC
  Filled 2016-07-31: qty 20

## 2016-07-31 MED ORDER — LACTATED RINGERS IV SOLN
INTRAVENOUS | Status: DC | PRN
  Administered 2016-07-31: 12:00:00 via INTRAVENOUS

## 2016-07-31 MED ORDER — FENTANYL CITRATE (PF) 100 MCG/2ML IJ SOLN
INTRAMUSCULAR | Status: AC
  Filled 2016-07-31: qty 2

## 2016-07-31 MED ORDER — CEFAZOLIN SODIUM-DEXTROSE 2-4 GM/100ML-% IV SOLN
INTRAVENOUS | Status: AC
  Filled 2016-07-31: qty 100

## 2016-07-31 MED ORDER — OXYCODONE HCL 5 MG PO TABS: 5.0000 mg | ORAL_TABLET | Freq: Once | ORAL | Status: DC | PRN

## 2016-07-31 MED ORDER — CEFAZOLIN SODIUM-DEXTROSE 2-3 GM-% IV SOLR
INTRAVENOUS | Status: DC | PRN
  Administered 2016-07-31: 2 g via INTRAVENOUS

## 2016-07-31 MED ORDER — MIDAZOLAM HCL 5 MG/5ML IJ SOLN
INTRAMUSCULAR | Status: DC | PRN
  Administered 2016-07-31: 2 mg via INTRAVENOUS

## 2016-07-31 MED ORDER — POLYMYXIN B SULFATE 500000 UNITS IJ SOLR
INTRAMUSCULAR | Status: DC | PRN
  Administered 2016-07-31: 500 mL

## 2016-07-31 MED ORDER — PROMETHAZINE HCL 25 MG/ML IJ SOLN: 6.2500 mg | INTRAMUSCULAR | Status: DC | PRN

## 2016-07-31 MED ORDER — LIDOCAINE HCL (CARDIAC) 20 MG/ML IV SOLN
INTRAVENOUS | Status: DC | PRN
  Administered 2016-07-31: 100 mg via INTRAVENOUS

## 2016-07-31 MED ORDER — CHLORHEXIDINE GLUCONATE 4 % EX LIQD
60.0000 mL | Freq: Once | CUTANEOUS | Status: AC
  Administered 2016-07-31: 4 via TOPICAL

## 2016-07-31 MED ORDER — SODIUM CHLORIDE 0.9 % IR SOLN
Status: DC | PRN
  Administered 2016-07-31: 3000 mL

## 2016-07-31 MED ORDER — HYDROMORPHONE HCL-NACL 0.5-0.9 MG/ML-% IV SOSY: 0.2500 mg | PREFILLED_SYRINGE | INTRAVENOUS | Status: DC | PRN

## 2016-07-31 MED ORDER — MIDAZOLAM HCL 2 MG/2ML IJ SOLN
INTRAMUSCULAR | Status: AC
  Filled 2016-07-31: qty 2

## 2016-07-31 MED ORDER — POLYMYXIN B SULFATE 500000 UNITS IJ SOLR
INTRAMUSCULAR | Status: AC
  Filled 2016-07-31: qty 500000

## 2016-07-31 MED ORDER — POVIDONE-IODINE 10 % EX SWAB: 2.0000 "application " | Freq: Once | CUTANEOUS | Status: DC

## 2016-07-31 MED ORDER — DEXAMETHASONE SODIUM PHOSPHATE 10 MG/ML IJ SOLN
INTRAMUSCULAR | Status: DC | PRN
  Administered 2016-07-31: 5 mg via INTRAVENOUS

## 2016-07-31 MED ORDER — DEXAMETHASONE SODIUM PHOSPHATE 10 MG/ML IJ SOLN
INTRAMUSCULAR | Status: AC
  Filled 2016-07-31: qty 1

## 2016-07-31 MED ORDER — ONDANSETRON HCL 4 MG/2ML IJ SOLN
INTRAMUSCULAR | Status: AC
  Filled 2016-07-31: qty 2

## 2016-07-31 MED ORDER — CEFAZOLIN SODIUM-DEXTROSE 2-4 GM/100ML-% IV SOLN: 2.0000 g | INTRAVENOUS | Status: DC

## 2016-07-31 MED ORDER — PROPOFOL 10 MG/ML IV BOLUS
INTRAVENOUS | Status: DC | PRN
  Administered 2016-07-31: 200 mg via INTRAVENOUS

## 2016-07-31 MED ORDER — OXYCODONE HCL 5 MG/5ML PO SOLN: 5.0000 mg | Freq: Once | ORAL | Status: DC | PRN

## 2016-07-31 SURGICAL SUPPLY — 27 items
BAG SPEC THK2 15X12 ZIP CLS (MISCELLANEOUS) ×1
BAG ZIPLOCK 12X15 (MISCELLANEOUS) ×3 IMPLANT
BANDAGE ACE 4X5 VEL STRL LF (GAUZE/BANDAGES/DRESSINGS) ×2 IMPLANT
BLADE SURG SZ10 CARB STEEL (BLADE) ×6 IMPLANT
BNDG GAUZE ELAST 4 BULKY (GAUZE/BANDAGES/DRESSINGS) ×2 IMPLANT
COVER SURGICAL LIGHT HANDLE (MISCELLANEOUS) ×3 IMPLANT
CUFF TOURN SGL QUICK 18 (TOURNIQUET CUFF) ×3 IMPLANT
DRAPE SURG 17X11 SM STRL (DRAPES) ×3 IMPLANT
ELECT REM PT RETURN 15FT ADLT (MISCELLANEOUS) ×3 IMPLANT
GAUZE SPONGE 4X4 12PLY STRL (GAUZE/BANDAGES/DRESSINGS) ×6 IMPLANT
GLOVE BIO SURGEON STRL SZ8 (GLOVE) ×3 IMPLANT
GLOVE ECLIPSE 7.5 STRL STRAW (GLOVE) ×3 IMPLANT
HANDPIECE INTERPULSE COAX TIP (DISPOSABLE) ×3
KIT BASIN OR (CUSTOM PROCEDURE TRAY) ×3 IMPLANT
MANIFOLD NEPTUNE II (INSTRUMENTS) ×3 IMPLANT
NS IRRIG 1000ML POUR BTL (IV SOLUTION) ×3 IMPLANT
PACK TOTAL JOINT (CUSTOM PROCEDURE TRAY) ×3 IMPLANT
PAD ABD 8X10 STRL (GAUZE/BANDAGES/DRESSINGS) ×2 IMPLANT
PAD CAST 4YDX4 CTTN HI CHSV (CAST SUPPLIES) ×1 IMPLANT
PADDING CAST COTTON 4X4 STRL (CAST SUPPLIES) ×3
POSITIONER SURGICAL ARM (MISCELLANEOUS) ×3 IMPLANT
SET HNDPC FAN SPRY TIP SCT (DISPOSABLE) IMPLANT
SOL PREP POV-IOD 4OZ 10% (MISCELLANEOUS) ×3 IMPLANT
SOL PREP PROV IODINE SCRUB 4OZ (MISCELLANEOUS) ×3 IMPLANT
SUT PROLENE 3 0 PS 2 (SUTURE) ×3 IMPLANT
SWAB COLLECTION DEVICE MRSA (MISCELLANEOUS) ×2 IMPLANT
WATER STERILE IRR 1500ML POUR (IV SOLUTION) ×3 IMPLANT

## 2016-07-31 NOTE — Anesthesia Postprocedure Evaluation (Signed)
Anesthesia Post Note  Patient: [name]  Procedure(s) Performed: Procedure(s) (LRB): AMPUTATION RIGHT FIFTH RAY AND APPLICATION OF WOUND VAC (Right)     Patient location during evaluation: PACU Anesthesia Type: General Level of consciousness: awake and alert Pain management: pain level controlled Vital Signs Assessment: post-procedure vital signs reviewed and stable Respiratory status: spontaneous breathing, nonlabored ventilation and respiratory function stable Cardiovascular status: blood pressure returned to baseline and stable Postop Assessment: no signs of nausea or vomiting Anesthetic complications: no    Last Vitals:  Vitals:   07/31/16 1430 07/31/16 1443  BP: 117/71 121/68  Pulse: 84 84  Resp: 18 16  Temp: 37.3 C 37.3 C    Last Pain:  Vitals:   07/31/16 1430  TempSrc:   PainSc: 0-No pain                 Lynda Rainwater

## 2016-07-31 NOTE — Progress Notes (Signed)
PROGRESS NOTE    [name]  IEP:[id number] DOB: October 02, 1970 DOA: 07/28/2016 PCP: Heywood Bene, PA-C    Brief Narrative:  46 year old male who presented with right foot wound infection and drainage. Patient is known to have hypertension, dyslipidemia, type 2 diabetes mellitus, obesity, chronic kidney disease stage II. He reports a gradually worsening wound on his right foot for last 20 days. Initially seen by his primary care provider on June 6 and started on doxycycline, and posteriorly on Bactrim. Patient developed persistent symptoms, worsening wound and pusdrainage, associated with edema and difficulty walking,which prompted him to come to the emergency room for elevation. On his initial physical examination his blood pressure 105/69, heart rate 83, respiratory rate 17, oxygen saturation 99%. Moist mucous membranes, lungs clear to auscultation, heart S1-S2 present rhythmic, the abdomen was protuberant but no organomegaly or peritoneal signs, lower extremity with right ankle edema, positive erythema, ulcerated wound unstageable at the forefoot of the plantar region on the right, full smelling and positive pus drainage. Sodium 135, potassium 3.5, chloride 101, bicarbonate 24, glucose 76, BUN 39, creatinine 2.73, AST 119, ALT 103, alkaline phosphatase 181, total bilirubin's 1.0, white cell count 22.7, hemoglobin 9.7, hematocrit 28.2, platelets 430, sedimentation rate morning 140, CRP 22.9. Urinalysis with 6-30 white cells, 100 protein. Chest x-ray negative for infiltrates. Foot x-ray with worsening soft tissue ulcer and soft tissue air about the fifth metatarsophalangeal joint, decrease density of the fifth metatarsal head suspicion for osteomyelitis. EKG with normal sinus rhythm    Assessment & Plan:   Principal Problem:   Diabetic foot infection ([place]) Active Problems:   Diabetes mellitus without complication ([place])   Dyslipidemia   HTN (hypertension)   Morbid obesity ([place])  Diabetic foot ulcer with osteomyelitis ([place])   Acute renal failure superimposed on stage 2 chronic kidney disease (HCC)   Anemia   1. Right foot osteomyelitis.  Will continue with broad spectrum antibiotic therapy, Vancomycin and Zosyn. Patient has remained afebrile, cell count at 20,9, scheduled for surgical intervention today for source control.   2. Type 2 diabetes mellitus. Continue glucose cover and monitoring with sliding scale, capillary glucose 96, 987, 103, 146.   3. AKI on Chronic kidney disease stage 2. Serum cr stable, at 2.0. Continue IV vancomycin per pharmacy protocol. Follow renal panel in am, avoid hypotension or nephrotoxic medications.   4. Dyslipidemia. Continue  statin  and gemfibrozil.   DVT prophylaxis:enoxaparin Code Status:full  Family Communication:I spoke with patient's wife at the bedside and all questions were addressed Disposition Plan:home    Consultants:  [facility]  Procedures:    Antimicrobials:   Vancomycin  Zosyn    Subjective: Pain controlled, no nausea or vomiting, tolerating po well, no dyspnea or chest pain, scheduled for surgical procedure today. No nausea or vomiting.   Objective: Vitals:   07/30/16 1446 07/30/16 2109 07/31/16 0542 07/31/16 0730  BP: 111/65 121/66 124/77   Pulse: 82 87 91   Resp: 16 16 16    Temp: 98.6 F (37 C) 99.1 F (37.3 C) (!) 100.4 F (38 C) 100 F (37.8 C)  TempSrc: Oral Oral Oral   SpO2: 100% 100% 99%   Weight:      Height:        Intake/Output Summary (Last 24 hours) at 07/31/16 1223 Last data filed at 07/31/16 0709  Gross per 24 hour  Intake              610 ml  Output  1800 ml  Net            -1190 ml   Filed Weights   07/28/16 1831  Weight: 118.8 kg (262 lb)    Examination:  General exam: not in pain or dyspnea E ENT. No pallor or icterus, oral mucosa moist.  Respiratory system: No wheezing, rales or rhonchi. Respiratory effort normal. Cardiovascular  system: S1 & S2 heard, RRR. No JVD, murmurs, rubs, gallops or clicks. No pedal edema. Gastrointestinal system: Abdomen is nondistended, soft and nontender. No organomegaly or masses felt. Normal bowel sounds heard. Central nervous system: Alert and oriented. No focal neurological deficits. Extremities: Symmetric 5 x 5 power. Skin: right foot with edema and erythema, ulcerated wound on the lateral foot.      Data Reviewed: I have personally reviewed following labs and imaging studies  CBC:  Recent Labs Lab 07/28/16 1844 07/30/16 0518 07/31/16 0451  WBC 22.7* 19.5* 20.9*  NEUTROABS 18.6* 15.8* 16.9*  HGB 9.7* 8.8* 8.6*  HCT 28.2* 26.3* 25.6*  MCV 75.2* 74.7* 74.6*  PLT 430* 432* 371*   Basic Metabolic Panel:  Recent Labs Lab 07/28/16 1844 07/29/16 1416 07/30/16 0518 07/31/16 0451  NA 135  --  137 136  K 3.5  --  4.1 4.2  CL 101  --  106 105  CO2 24  --  21* 23  GLUCOSE 76  --  96 112*  BUN 39*  --  36* 32*  CREATININE 2.73* 2.33* 2.07* 1.69*  CALCIUM 9.2  --  8.8* 8.7*   GFR: Estimated Creatinine Clearance: 72.7 mL/min (A) (by C-G formula based on SCr of 1.69 mg/dL (H)). Liver Function Tests:  Recent Labs Lab 07/28/16 1844  AST 119*  ALT 103*  ALKPHOS 181*  BILITOT 1.0  PROT 8.7*  ALBUMIN 3.4*    Recent Labs Lab 07/28/16 1844  LIPASE 18   No results for input(s): AMMONIA in the last 168 hours. Coagulation Profile:  Recent Labs Lab 07/28/16 2304  INR 1.15   Cardiac Enzymes: No results for input(s): CKTOTAL, CKMB, CKMBINDEX, TROPONINI in the last 168 hours. BNP (last 3 results) No results for input(s): PROBNP in the last 8760 hours. HbA1C:  Recent Labs  07/29/16 0226  HGBA1C 5.3   CBG:  Recent Labs Lab 07/30/16 1219 07/30/16 1719 07/30/16 2106 07/31/16 0754 07/31/16 1156  GLUCAP 90 91 102* 96 98   Lipid Profile: No results for input(s): CHOL, HDL, LDLCALC, TRIG, CHOLHDL, LDLDIRECT in the last 72 hours. Thyroid Function  Tests: No results for input(s): TSH, T4TOTAL, FREET4, T3FREE, THYROIDAB in the last 72 hours. Anemia Panel:  Recent Labs  07/29/16 0226  VITAMINB12 4,656*  FOLATE 13.6  FERRITIN 705*  TIBC 178*  IRON 9*  RETICCTPCT 0.7   Sepsis Labs:  Recent Labs Lab 07/28/16 2315  LATICACIDVEN 0.98    Recent Results (from the past 240 hour(s))  Culture, blood (Routine x 2)     Status: None (Preliminary result)   Collection Time: 07/28/16 11:06 PM  Result Value Ref Range Status   Specimen Description BLOOD BLOOD RIGHT HAND  Final   Special Requests   Final    BOTTLES DRAWN AEROBIC AND ANAEROBIC Blood Culture results may not be optimal due to an inadequate volume of blood received in culture bottles   Culture   Final    NO GROWTH 2 DAYS Performed at [facility] [address], [place], [place] [address]    Report Status PENDING  Incomplete  Wound or Superficial Culture     Status: None (Preliminary result)   Collection Time: 07/28/16 11:06 PM  Result Value Ref Range Status   Specimen Description FOOT RIGHT  Final   Special Requests NONE  Final   Gram Stain   Final    NO WBC SEEN FEW GRAM POSITIVE COCCI FEW GRAM NEGATIVE RODS    Culture   Final    MODERATE ENTEROCOCCUS FAECALIS SUSCEPTIBILITIES TO FOLLOW Performed at [facility], [place] [address] [place], [place] [address]    Report Status PENDING  Incomplete  Culture, blood (Routine x 2)     Status: None (Preliminary result)   Collection Time: 07/28/16 11:45 PM  Result Value Ref Range Status   Specimen Description BLOOD LEFT ANTECUBITAL  Final   Special Requests   Final    BOTTLES DRAWN AEROBIC AND ANAEROBIC Blood Culture adequate volume   Culture   Final    NO GROWTH 2 DAYS Performed at [facility], [place] [address], [place], [place] [address]    Report Status PENDING  Incomplete  Surgical PCR screen     Status: None   Collection Time: 07/30/16 10:17 PM  Result Value Ref Range Status   MRSA, PCR  NEGATIVE NEGATIVE Final   Staphylococcus aureus NEGATIVE NEGATIVE Final    Comment:        The Xpert SA Assay (FDA approved for NASAL specimens in patients over 1 years of age), is one component of a comprehensive surveillance program.  Test performance has been validated by [facility] for patients greater than or equal to 91 year old. It is not intended to diagnose infection nor to guide or monitor treatment.          Radiology Studies: Mr Foot Right W Contrast  Result Date: 07/30/2016 CLINICAL DATA:  Right foot pain.  Ulcer of the fifth toe. EXAM: MRI OF THE RIGHT FOREFOOT WITH CONTRAST TECHNIQUE: Multiplanar, multisequence MR imaging of the right forefoot was performed following the administration of intravenous contrast. CONTRAST:  11mL MULTIHANCE GADOBENATE DIMEGLUMINE 529 MG/ML IV SOLN COMPARISON:  None. FINDINGS: Bones/Joint/Cartilage Soft tissue ulcer overlying the fifth metatarsal with associated soft tissue emphysema along the dorsal aspect. Cortical destruction and severe marrow edema of the fifth metatarsal head. Severe marrow edema of the fifth proximal phalanx with cortical irregularity along the base. Marrow edema in the fifth distal phalanx. No other marrow signal abnormality. No acute fracture dislocation. Normal alignment. No joint effusion. Ligaments Collateral ligaments are intact.  Lisfranc ligament is intact. Muscles and Tendons Flexor, peroneal and extensor compartment tendons are intact. Muscles are normal. Soft tissue Soft tissue swelling and enhancement of the dorsal aspect of the foot concerning for cellulitis. No drainable fluid collection. No soft tissue mass. IMPRESSION: 1. Soft tissue ulcer overlying the fifth metatarsal with surrounding cellulitis and soft tissue emphysema. Osteomyelitis involving the fifth metatarsal head and base of the fifth proximal phalanx. Mild marrow edema in the fifth distal phalanx likely reactive. 2. No drainable fluid collection to  suggest an abscess. Electronically Signed   By: Kathreen Devoid   On: 07/30/2016 08:28        Scheduled Meds: . aspirin EC  81 mg Oral Daily  . atorvastatin  40 mg Oral q1800  . gemfibrozil  600 mg Oral BID AC  . heparin  5,000 Units Subcutaneous Q8H  . insulin aspart  0-9 Units Subcutaneous TID WC  . multivitamin with minerals  1 tablet Oral BID  .  omega-3 acid ethyl esters  1 g Oral Daily  . povidone-iodine  2 application Topical Once  . vitamin B-12  5,000 mcg Oral Daily   Continuous Infusions: .  ceFAZolin (ANCEF) IV    . piperacillin-tazobactam (ZOSYN)  IV 3.375 g (07/31/16 8127)  . vancomycin Stopped (07/30/16 2235)     LOS: 2 days      Latiesha Harada Gerome Apley, MD [facility] Pager [phone]  If 7PM-7AM, please contact night-coverage [email or web address] Password [facility] 07/31/2016, 12:23 PM

## 2016-07-31 NOTE — Anesthesia Preprocedure Evaluation (Signed)
Anesthesia Evaluation    Airway Mallampati: II  TM Distance: >3 FB Neck ROM: Full    Dental no notable dental hx.    Pulmonary    Pulmonary exam normal breath sounds clear to auscultation       Cardiovascular hypertension, Pt. on medications Normal cardiovascular exam Rhythm:Regular Rate:Normal     Neuro/Psych    GI/Hepatic   Endo/Other  diabetes  Renal/GU Renal InsufficiencyRenal disease     Musculoskeletal   Abdominal   Peds  Hematology  (+) anemia ,   Anesthesia Other Findings osteomyelitis  Reproductive/Obstetrics                             Anesthesia Physical Anesthesia Plan  ASA: III  Anesthesia Plan: General   Post-op Pain Management:    Induction: Intravenous  PONV Risk Score and Plan: 2 and Ondansetron  Airway Management Planned: LMA  Additional Equipment:   Intra-op Plan:   Post-operative Plan: Extubation in OR  Informed Consent: I have reviewed the patients History and Physical, chart, labs and discussed the procedure including the risks, benefits and alternatives for the proposed anesthesia with the patient or authorized representative who has indicated his/her understanding and acceptance.   Dental advisory given  Plan Discussed with: CRNA  Anesthesia Plan Comments:         Anesthesia Quick Evaluation

## 2016-07-31 NOTE — Brief Op Note (Signed)
07/28/2016 - 07/31/2016  1:32 PM  PATIENT:  [name] [name]  46 y.o. male  PRE-OPERATIVE DIAGNOSIS:  osteomyelitis, complex wound infection right foot  POST-OPERATIVE DIAGNOSIS:  * No post-op diagnosis entered *  PROCEDURE:  Procedure(s): AMPUTATION RIGHT FIFTH RAY AND APPLICATION OF WOUND VAC (Right)  SURGEON:  Surgeon(s) and Role:    Dorna Leitz, MD - Primary  PHYSICIAN ASSISTANT:   ASSISTANTS: bethune   ANESTHESIA:   general  EBL:  Total I/O In: 500 [I.V.:500] Out: -   BLOOD ADMINISTERED:none  DRAINS: none   LOCAL MEDICATIONS USED:  NONE  SPECIMEN:  Source of Specimen:  r 5th toe and metatarsal  DISPOSITION OF SPECIMEN:  N/A  COUNTS:  YES  TOURNIQUET:   Total Tourniquet Time Documented: Calf (Right) - 9 minutes Total: Calf (Right) - 9 minutes   DICTATION: .Other Dictation: Dictation Number [id number]  PLAN OF CARE: Admit to inpatient   PATIENT DISPOSITION:  PACU - hemodynamically stable.   Delay start of Pharmacological VTE agent (>24hrs) due to surgical blood loss or risk of bleeding: no

## 2016-07-31 NOTE — Progress Notes (Signed)
Subjective: Worsening erythema and pain   Objective: Vital signs in last 24 hours: Temp:  [98.6 F (37 C)-100.4 F (38 C)] 100 F (37.8 C) (07/12 0730) Pulse Rate:  [82-91] 91 (07/12 0542) Resp:  [16] 16 (07/12 0542) BP: (111-124)/(65-77) 124/77 (07/12 0542) SpO2:  [99 %-100 %] 99 % (07/12 0542)  Intake/Output from previous day: 07/11 0701 - 07/12 0700 In: 850 [P.O.:600; IV Piggyback:250] Out: 2300 [Urine:2300] Intake/Output this shift: No intake/output data recorded.   Recent Labs  07/28/16 1844 07/30/16 0518 07/31/16 0451  HGB 9.7* 8.8* 8.6*    Recent Labs  07/30/16 0518 07/31/16 0451  WBC 19.5* 20.9*  RBC 3.52* 3.43*  HCT 26.3* 25.6*  PLT 432* 446*    Recent Labs  07/30/16 0518 07/31/16 0451  NA 137 136  K 4.1 4.2  CL 106 105  CO2 21* 23  BUN 36* 32*  CREATININE 2.07* 1.69*  GLUCOSE 96 112*  CALCIUM 8.8* 8.7*    Recent Labs  07/28/16 2304  INR 1.15    ABD soft Intact pulses distally worsening redness over dorsum of footi  Assessment/Plan: Plan amputation and wound vac to assess demarcation of infection   Soha Thorup L 07/31/2016, 12:41 PM

## 2016-07-31 NOTE — Transfer of Care (Signed)
Immediate Anesthesia Transfer of Care Note  Patient: [name]  Procedure(s) Performed: Procedure(s): AMPUTATION RIGHT FIFTH RAY AND APPLICATION OF WOUND VAC (Right)  Patient Location: PACU  Anesthesia Type:General  Level of Consciousness:  sedated, patient cooperative and responds to stimulation  Airway & Oxygen Therapy:Patient Spontanous Breathing and Patient connected to face mask oxgen  Post-op Assessment:  Report given to PACU RN and Post -op Vital signs reviewed and stable  Post vital signs:  Reviewed and stable  Last Vitals:  Vitals:   07/31/16 0542 07/31/16 0730  BP: 124/77   Pulse: 91   Resp: 16   Temp: (!) 38 C 23.7 C    Complications: No apparent anesthesia complications

## 2016-07-31 NOTE — Consult Note (Signed)
ORTHOPAEDIC CONSULTATION  REQUESTING PHYSICIAN: Arrien, Jimmy Picket,*  Chief Complaint: Several week history of ulcer fifth metatarsal head right foot  HPI: [name] is a 46 y.o. male who presents with necrotic draining ulcer right foot fifth metatarsal head. Patient has diabetic insensate in the as well as hypertension and renal disease.  Past Medical History:  Diagnosis Date  . CKD (chronic kidney disease), stage II   . Diabetes mellitus without complication ([place])   . Hyperlipidemia   . Hypertension   . Obesity    Past Surgical History:  Procedure Laterality Date  . ANAL FISSURE REPAIR  2000  . CHOLECYSTECTOMY    . Circumcisum  2006  . wisdom teeth extracted  1984   Social History   Social History  . Marital status: Married    Spouse name: N/A  . Number of children: N/A  . Years of education: N/A   Social History Main Topics  . Smoking status: Never Smoker  . Smokeless tobacco: Never Used  . Alcohol use No  . Drug use: Unknown  . Sexual activity: Not Asked   Other Topics Concern  . None   Social History Narrative  . None   Family History  Problem Relation Age of Onset  . Diabetes Father    - negative except otherwise stated in the family history section No Known Allergies Prior to Admission medications   Medication Sig Start Date End Date Taking? Authorizing Provider  aspirin EC 81 MG tablet Take 81 mg by mouth daily.   Yes [provider]  atorvastatin (LIPITOR) 40 MG tablet Take 40 mg by mouth daily.   Yes [provider]  Cyanocobalamin (VITAMIN B-12) 5000 MCG SUBL Place 5,000 mcg under the tongue daily.   Yes [provider]  gemfibrozil (LOPID) 600 MG tablet Take 600 mg by mouth 2 (two) times daily before a meal.    Yes [provider]  hydrochlorothiazide (MICROZIDE) 12.5 MG capsule Take 12.5 mg by mouth daily.    Yes [provider]  insulin glargine (LANTUS) 100 UNIT/ML injection Inject  24-26 Units into the skin 2 (two) times daily. Pt uses 26 units in the morning and 24 at night.   Yes [provider]  insulin lispro (HUMALOG) 100 UNIT/ML injection Inject 10 Units into the skin 2 (two) times daily.   Yes [provider]  Multiple Vitamin (MULTIVITAMIN WITH MINERALS) TABS tablet Take 1 tablet by mouth 2 (two) times daily.   Yes [provider]  omega-3 acid ethyl esters (LOVAZA) 1 g capsule Take 1 g by mouth daily.   Yes [provider]  ramipril (ALTACE) 5 MG capsule Take 5 mg by mouth daily.   Yes [provider]  sitaGLIPtin (JANUVIA) 50 MG tablet Take 50 mg by mouth daily.   Yes [provider]  sulfamethoxazole-trimethoprim (BACTRIM DS,SEPTRA DS) 800-160 MG tablet Take 1 tablet by mouth 2 (two) times daily. 07/24/16 07/31/16 Yes [provider]   Mr Foot Right W Contrast  Result Date: 07/30/2016 CLINICAL DATA:  Right foot pain.  Ulcer of the fifth toe. EXAM: MRI OF THE RIGHT FOREFOOT WITH CONTRAST TECHNIQUE: Multiplanar, multisequence MR imaging of the right forefoot was performed following the administration of intravenous contrast. CONTRAST:  21mL MULTIHANCE GADOBENATE DIMEGLUMINE 529 MG/ML IV SOLN COMPARISON:  None. FINDINGS: Bones/Joint/Cartilage Soft tissue ulcer overlying the fifth metatarsal with associated soft tissue emphysema along the dorsal aspect. Cortical destruction and severe marrow edema of the  fifth metatarsal head. Severe marrow edema of the fifth proximal phalanx with cortical irregularity along the base. Marrow edema in the fifth distal phalanx. No other marrow signal abnormality. No acute fracture dislocation. Normal alignment. No joint effusion. Ligaments Collateral ligaments are intact.  Lisfranc ligament is intact. Muscles and Tendons Flexor, peroneal and extensor compartment tendons are intact. Muscles are normal. Soft tissue Soft tissue swelling and enhancement of the dorsal aspect of the foot  concerning for cellulitis. No drainable fluid collection. No soft tissue mass. IMPRESSION: 1. Soft tissue ulcer overlying the fifth metatarsal with surrounding cellulitis and soft tissue emphysema. Osteomyelitis involving the fifth metatarsal head and base of the fifth proximal phalanx. Mild marrow edema in the fifth distal phalanx likely reactive. 2. No drainable fluid collection to suggest an abscess. Electronically Signed   By: Kathreen Devoid   On: 07/30/2016 08:28   - pertinent xrays, CT, MRI studies were reviewed and independently interpreted  Positive ROS: All other systems have been reviewed and were otherwise negative with the exception of those mentioned in the HPI and as above.  Physical Exam: General: Alert, no acute distress Psychiatric: Patient is competent for consent with normal mood and affect Lymphatic: No axillary or cervical lymphadenopathy Cardiovascular: No pedal edema Respiratory: No cyanosis, no use of accessory musculature GI: No organomegaly, abdomen is soft and non-tender  Skin: Patient has a necrotic ulcer beneath the fifth metatarsal head. There is blistering dorsally over the foot over the dorsum of the fourth and fifth metatarsal head most likely consistent with a deep abscess dorsally.   Neurologic: Patient does not have protective sensation bilateral lower extremities.   MUSCULOSKELETAL:  Examination patient has a strong dorsalis pedis pulse on the left foot right foot pulse is faint due to the swelling. Patient has a necrotic ulcer beneath the fifth metatarsal head which probes to bone. Patient has an MRI scan which shows osteomyelitis.  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  Plan: Plan: I agree with the plans to proceed with an amputation of the fifth ray. Patient most likely will need a repeat surgery if there is an abscess dorsally on the foot. May  Require a fourth ray amputation as well.  Patient is in agreement to proceed with surgery.  Thank you for the consult and the opportunity to see [name]  Marcus Duda, [place] [phone] 7:26 AM

## 2016-07-31 NOTE — Anesthesia Procedure Notes (Signed)
Procedure Name: LMA Insertion Date/Time: 07/31/2016 1:07 PM Performed by: Francelia Mclaren, Virgel Gess Pre-anesthesia Checklist: Patient identified, Emergency Drugs available, Suction available and Patient being monitored Patient Re-evaluated:Patient Re-evaluated prior to induction Oxygen Delivery Method: Circle System Utilized Preoxygenation: Pre-oxygenation with 100% oxygen Induction Type: IV induction Ventilation: Mask ventilation without difficulty LMA: LMA inserted LMA Size: 5.0 Number of attempts: 1 Airway Equipment and Method: Bite block Placement Confirmation: positive ETCO2 Tube secured with: Tape Dental Injury: Teeth and Oropharynx as per pre-operative assessment

## 2016-07-31 NOTE — Consult Note (Signed)
   [facility] CM Inpatient Consult   07/31/2016  [name] 1970-12-31 [id number]    [name] is active with the [employer] Care Management program for [facility] employees/dependents with [facility] insurance. He has been followed for DM.   Went to bedside. However, he is off the unit for surgery. Family in room.   Will come back at later time.   Marthenia Rolling, MSN-Ed, RN,BSN [facility] Liaison [phone]

## 2016-08-01 LAB — CBC WITH DIFFERENTIAL/PLATELET
Basophils Absolute: 0 10*3/uL (ref 0.0–0.1)
Basophils Relative: 0 %
Eosinophils Absolute: 0.2 10*3/uL (ref 0.0–0.7)
Eosinophils Relative: 1 %
HCT: 24.4 % — ABNORMAL LOW (ref 39.0–52.0)
Hemoglobin: 8.3 g/dL — ABNORMAL LOW (ref 13.0–17.0)
Lymphocytes Relative: 12 %
Lymphs Abs: 2.6 10*3/uL (ref 0.7–4.0)
MCH: 25 pg — ABNORMAL LOW (ref 26.0–34.0)
MCHC: 34 g/dL (ref 30.0–36.0)
MCV: 73.5 fL — ABNORMAL LOW (ref 78.0–100.0)
Monocytes Absolute: 1.5 10*3/uL — ABNORMAL HIGH (ref 0.1–1.0)
Monocytes Relative: 7 %
Neutro Abs: 17.4 10*3/uL — ABNORMAL HIGH (ref 1.7–7.7)
Neutrophils Relative %: 80 %
Platelets: 450 10*3/uL — ABNORMAL HIGH (ref 150–400)
RBC: 3.32 MIL/uL — ABNORMAL LOW (ref 4.22–5.81)
RDW: 13.5 % (ref 11.5–15.5)
WBC: 21.7 10*3/uL — ABNORMAL HIGH (ref 4.0–10.5)

## 2016-08-01 LAB — BASIC METABOLIC PANEL
Anion gap: 9 (ref 5–15)
BUN: 26 mg/dL — ABNORMAL HIGH (ref 6–20)
CO2: 22 mmol/L (ref 22–32)
Calcium: 8.7 mg/dL — ABNORMAL LOW (ref 8.9–10.3)
Chloride: 105 mmol/L (ref 101–111)
Creatinine, Ser: 1.39 mg/dL — ABNORMAL HIGH (ref 0.61–1.24)
GFR calc Af Amer: >60 mL/min (ref >60)
GFR calc non Af Amer: 59 mL/min — ABNORMAL LOW (ref >60)
Glucose, Bld: 120 mg/dL — ABNORMAL HIGH (ref 65–99)
Potassium: 4.2 mmol/L (ref 3.5–5.1)
Sodium: 136 mmol/L (ref 135–145)

## 2016-08-01 LAB — AEROBIC CULTURE  (SUPERFICIAL SPECIMEN)

## 2016-08-01 LAB — AEROBIC CULTURE W GRAM STAIN (SUPERFICIAL SPECIMEN): Gram Stain: NONE SEEN

## 2016-08-01 LAB — GLUCOSE, CAPILLARY
Glucose-Capillary: 100 mg/dL — ABNORMAL HIGH (ref 65–99)
Glucose-Capillary: 126 mg/dL — ABNORMAL HIGH (ref 65–99)
Glucose-Capillary: 168 mg/dL — ABNORMAL HIGH (ref 65–99)
Glucose-Capillary: 155 mg/dL — ABNORMAL HIGH (ref 65–99)

## 2016-08-01 LAB — VANCOMYCIN, TROUGH: Vancomycin Tr: 10 ug/mL — ABNORMAL LOW (ref 15–20)

## 2016-08-01 NOTE — Progress Notes (Signed)
VAC installation/irrigating dressing applied to the lateral right foot wound without difficulty today. No significant bleeding encountered. This will be set up to irrigate with 30 mL of saline for 10 minutes and then suction 3-1/2 hours in an alternating fashion. We will leave this dressing in place until Monday or Tuesday of next week and then determine additional/further treatment options. Dr. Vernie Shanks Nida PA-C our on call this weekend and will see [name] one of the days over the weekend. He may be out of bed to the chair daily putting a little weight on his heel only.

## 2016-08-01 NOTE — Progress Notes (Signed)
PROGRESS NOTE    [name]  TIR:[id number] DOB: [birth year] DOA: 07/28/2016 PCP: Heywood Bene, PA-C    Brief Narrative:  46 year old male who presented with right foot wound infection and drainage. Patient is known to have hypertension, dyslipidemia, type 2 diabetes mellitus, obesity, chronic kidney disease stage II. He reports a gradually worsening wound on his right foot for last 20 days. Initially seen by his primary care provider on June 6 and started on doxycycline, and posteriorly on Bactrim. Patient developed persistent symptoms, worsening wound and pusdrainage, associated with edema and difficulty walking,which prompted him to come to the emergency room for elevation. On his initial physical examination his blood pressure 105/69, heart rate 83, respiratory rate 17, oxygen saturation 99%. Moist mucous membranes, lungs clear to auscultation, heart S1-S2 present rhythmic, the abdomen was protuberant but no organomegaly or peritoneal signs, lower extremity with right ankle edema, positive erythema, ulcerated wound unstageable at the forefoot of the plantar region on the right, full smelling and positive pus drainage. Sodium 135, potassium 3.5, chloride 101, bicarbonate 24, glucose 76, BUN 39, creatinine 2.73, AST 119, ALT 103, alkaline phosphatase 181, total bilirubin's 1.0, white cell count 22.7, hemoglobin 9.7, hematocrit 28.2, platelets 430, sedimentation rate morning 140, CRP 22.9. Urinalysis with 6-30 white cells, 100 protein. Chest x-ray negative for infiltrates. Foot x-ray with worsening soft tissue ulcer and soft tissue air about the fifth metatarsophalangeal joint, decrease density of the fifth metatarsal head suspicion for osteomyelitis. EKG with normal sinus rhythm   Patient admitted to the hospital with the working diagnosis of right foot diabetic foot infection to rule out osteomyelitis.   Assessment & Plan:   Principal Problem:   Diabetic foot infection ([place]) Active  Problems:   Diabetes mellitus without complication ([place])   Dyslipidemia   HTN (hypertension)   Morbid obesity ([place])   Diabetic foot ulcer with osteomyelitis ([place])   Acute renal failure superimposed on stage 2 chronic kidney disease ([facility])   Anemia  1. Right foot osteomyelitis. Patient underwent . AMPUTATION RIGHT FIFTH RAY AND APPLICATION OF WOUND VAC (Right), with good toleration, will continue IV antibiotic therapy with vancomycin and zosyn for now, wbc at 21, Patient has remained afebrile. Plan for wound vac.   2. Type 2 diabetes mellitus. Capillary glucose ontinue glucose 103, 146, 150, 100, 126. Will continue glucose cover and monitoring with sliding scale. Patient tolerating po well, with no nausea or vomiting.   3. AKI on Chronic kidney disease stage 2. Serum cr stable, at 1,39. On  IV vancomycin per pharmacy protocol, follow levels. Patient tolerating po well, will hold on IV fluids and follow renal panel in am.    4. Dyslipidemia. Continue therapy with atorvastatin  and gemfibrozil.   DVT prophylaxis:enoxaparin Code Status:full  Family Communication:I spoke with patient's wife at the bedside and all questions were addressed Disposition Plan:home    Consultants:  [facility]oped[facility]  Procedures:    Antimicrobials:   Vancomycin  Zosyn    Subjective: Patient feeling better, no nausea or vomiting, pain well controlled, no chest pain or dyspnea.   Objective: Vitals:   07/31/16 2129 08/01/16 0122 08/01/16 0601 08/01/16 1503  BP: 129/74 128/72 119/74 128/73  Pulse: 82 81 80 77  Resp: 17 18 17 18   Temp: 99.2 F (37.3 C) 98.5 F (36.9 C) 98.3 F (36.8 C) 98.4 F (36.9 C)  TempSrc: Oral Oral Oral Oral  SpO2: 99% 99% 100% 100%  Weight:      Height:  Intake/Output Summary (Last 24 hours) at 08/01/16 1605 Last data filed at 08/01/16 1010  Gross per 24 hour  Intake             1050 ml  Output             1700 ml  Net             -650 ml    Filed Weights   07/28/16 1831  Weight: 118.8 kg (262 lb)    Examination:  General exam: not in pain or dyspnea E ENT, no pallor or icterus. Respiratory system: No wheezing, rales or rhonchi. Respiratory effort normal. Cardiovascular system: S1 & S2 heard, RRR. No JVD, murmurs, rubs, gallops or clicks. No pedal edema. Gastrointestinal system: Abdomen is nondistended, soft and nontender. No organomegaly or masses felt. Normal bowel sounds heard. Central nervous system: Alert and oriented. No focal neurological deficits. Extremities: Symmetric 5 x 5 power. Skin: right foot with dressing in place. Trace edema.      Data Reviewed: I have personally reviewed following labs and imaging studies  CBC:  Recent Labs Lab 07/28/16 1844 07/30/16 0518 07/31/16 0451 08/01/16 0529  WBC 22.7* 19.5* 20.9* 21.7*  NEUTROABS 18.6* 15.8* 16.9* 17.4*  HGB 9.7* 8.8* 8.6* 8.3*  HCT 28.2* 26.3* 25.6* 24.4*  MCV 75.2* 74.7* 74.6* 73.5*  PLT 430* 432* 446* 235*   Basic Metabolic Panel:  Recent Labs Lab 07/28/16 1844 07/29/16 1416 07/30/16 0518 07/31/16 0451 08/01/16 0529  NA 135  --  137 136 136  K 3.5  --  4.1 4.2 4.2  CL 101  --  106 105 105  CO2 24  --  21* 23 22  GLUCOSE 76  --  96 112* 120*  BUN 39*  --  36* 32* 26*  CREATININE 2.73* 2.33* 2.07* 1.69* 1.39*  CALCIUM 9.2  --  8.8* 8.7* 8.7*   GFR: Estimated Creatinine Clearance: 88.4 mL/min (A) (by C-G formula based on SCr of 1.39 mg/dL (H)). Liver Function Tests:  Recent Labs Lab 07/28/16 1844  AST 119*  ALT 103*  ALKPHOS 181*  BILITOT 1.0  PROT 8.7*  ALBUMIN 3.4*    Recent Labs Lab 07/28/16 1844  LIPASE 18   No results for input(s): AMMONIA in the last 168 hours. Coagulation Profile:  Recent Labs Lab 07/28/16 2304  INR 1.15   Cardiac Enzymes: No results for input(s): CKTOTAL, CKMB, CKMBINDEX, TROPONINI in the last 168 hours. BNP (last 3 results) No results for input(s): PROBNP in the last 8760  hours. HbA1C: No results for input(s): HGBA1C in the last 72 hours. CBG:  Recent Labs Lab 07/31/16 1353 07/31/16 1554 07/31/16 2124 08/01/16 0714 08/01/16 1217  GLUCAP 103* 146* 150* 100* 126*   Lipid Profile: No results for input(s): CHOL, HDL, LDLCALC, TRIG, CHOLHDL, LDLDIRECT in the last 72 hours. Thyroid Function Tests: No results for input(s): TSH, T4TOTAL, FREET4, T3FREE, THYROIDAB in the last 72 hours. Anemia Panel: No results for input(s): VITAMINB12, FOLATE, FERRITIN, TIBC, IRON, RETICCTPCT in the last 72 hours. Sepsis Labs:  Recent Labs Lab 07/28/16 2315  LATICACIDVEN 0.98    Recent Results (from the past 240 hour(s))  Culture, blood (Routine x 2)     Status: None (Preliminary result)   Collection Time: 07/28/16 11:06 PM  Result Value Ref Range Status   Specimen Description BLOOD BLOOD RIGHT HAND  Final   Special Requests   Final    BOTTLES DRAWN AEROBIC AND ANAEROBIC Blood Culture results may not  be optimal due to an inadequate volume of blood received in culture bottles   Culture   Final    NO GROWTH 3 DAYS Performed at [facility], [place] [address], [place], [place] [address]    Report Status PENDING  Incomplete  Wound or Superficial Culture     Status: None   Collection Time: 07/28/16 11:06 PM  Result Value Ref Range Status   Specimen Description FOOT RIGHT  Final   Special Requests NONE  Final   Gram Stain   Final    NO WBC SEEN FEW GRAM POSITIVE COCCI FEW GRAM NEGATIVE RODS    Culture   Final    MODERATE ENTEROCOCCUS FAECALIS WITHIN MIXED ORGANISMS Performed at [facility], [place] [address], [place], [place] [address]    Report Status 08/01/2016 FINAL  Final   Organism ID, Bacteria ENTEROCOCCUS FAECALIS  Final      Susceptibility   Enterococcus faecalis - MIC*    AMPICILLIN <=2 SENSITIVE Sensitive     VANCOMYCIN 1 SENSITIVE Sensitive     GENTAMICIN SYNERGY SENSITIVE Sensitive     * MODERATE ENTEROCOCCUS FAECALIS  Culture, blood  (Routine x 2)     Status: None (Preliminary result)   Collection Time: 07/28/16 11:45 PM  Result Value Ref Range Status   Specimen Description BLOOD LEFT ANTECUBITAL  Final   Special Requests   Final    BOTTLES DRAWN AEROBIC AND ANAEROBIC Blood Culture adequate volume   Culture   Final    NO GROWTH 3 DAYS Performed at [facility], [place] [address], [place], [place] [address]    Report Status PENDING  Incomplete  Surgical PCR screen     Status: None   Collection Time: 07/30/16 10:17 PM  Result Value Ref Range Status   MRSA, PCR NEGATIVE NEGATIVE Final   Staphylococcus aureus NEGATIVE NEGATIVE Final    Comment:        The Xpert SA Assay (FDA approved for NASAL specimens in patients over 77 years of age), is one component of a comprehensive surveillance program.  Test performance has been validated by [facility] for patients greater than or equal to 31 year old. It is not intended to diagnose infection nor to guide or monitor treatment.   Aerobic/Anaerobic Culture (surgical/deep wound)     Status: None (Preliminary result)   Collection Time: 07/31/16  1:38 PM  Result Value Ref Range Status   Specimen Description TOE RIGHT  Final   Special Requests NONE  Final   Gram Stain   Final    ABUNDANT WBC PRESENT,BOTH PMN AND MONONUCLEAR RARE GRAM POSITIVE COCCI IN PAIRS    Culture   Final    CULTURE REINCUBATED FOR BETTER GROWTH Performed at [facility], [place] [address], [place], [place] [address]    Report Status PENDING  Incomplete         Radiology Studies: No results found.      Scheduled Meds: . aspirin EC  81 mg Oral Daily  . atorvastatin  40 mg Oral q1800  . gemfibrozil  600 mg Oral BID AC  . insulin aspart  0-9 Units Subcutaneous TID WC  . multivitamin with minerals  1 tablet Oral BID  . omega-3 acid ethyl esters  1 g Oral Daily  . vitamin B-12  5,000 mcg Oral Daily   Continuous Infusions: . piperacillin-tazobactam (ZOSYN)  IV 3.375 g  (08/01/16 1431)  . vancomycin Stopped (07/31/16 2251)  LOS: 3 days     Danne Scardina Gerome Apley, MD [facility] Pager [phone]  If 7PM-7AM, please contact night-coverage [email or web address] Password [facility] 08/01/2016, 4:05 PM

## 2016-08-01 NOTE — Op Note (Signed)
NAME:  [name], [name]                ACCOUNT NO.:  [id number]  MEDICAL RECORD NO.:  [id number]  LOCATION:  WA06                         FACILITY:  [facility]  PHYSICIAN:  Alta Corning, M.D.   DATE OF BIRTH:  September 06, 1970  DATE OF PROCEDURE:  07/31/2016 DATE OF DISCHARGE:                              OPERATIVE REPORT   PREOPERATIVE DIAGNOSIS:  Severe infection of foot with osteomyelitis of the fifth metatarsal and base of the fifth proximal phalanx.  POSTOPERATIVE DIAGNOSIS:  Severe infection of foot with osteomyelitis of the fifth metatarsal and base of the fifth proximal phalanx.  PROCEDURES: 1. Left fifth ray amputation. 2. Debridement of skin, subcutaneous tissue, muscle, and fascia at the     site of a complex wound infection.  SURGEON:  Alta Corning, M.D.  ASSISTANT:  Gary Fleet, P.A.  ANESTHESIA:  General.  BRIEF HISTORY:  [name] is a 46 year old male with a history of having had an ulcer of the plantar aspect of his foot, he had washes for period of time, it did not improve and because of continued drainage and pain, he was brought to the hospital and admitted for IV antibiotic therapy. His family was relatively adamant early on about not having any amputation.  MRI was obtained to define the extent of the infection, unfortunately showed was a significant abscess on the top of the foot, it showed this obvious ulcerated place, showed severe osteomyelitis of the fifth metatarsal head and fifth proximal phalanx.  We discussed with the family treatment options, we really were shifting unfortunately because he had no improvement with antibiotics over 36 hours into salvage mode, at that point, they were sort of excepting of amputation, he was taken to the operating room for amputation and debridement as needed.  DESCRIPTION OF PROCEDURE:  The patient was taken to the operating room. After adequate anesthesia was obtained with general anesthetic, the patient was placed  supine on the operating table.  Right leg was prepped and draped in usual sterile fashion.  Following this, an incision was made to excise the ulcer and to try to preserve as much skin as possible.  The fifth toe was removed as well.  We then dissected down to the level of the bone, resected some of the tissues off the bone and then did a fifth ray amputation.  Unfortunately, we encountered significant amount of pus that was going all the way across the top of the foot all the way really over to the second metatarsal.  We irrigated this area out thoroughly with 3 L normal saline irrigation and then a liter of double antibiotic irrigant and thoughtful about trying to put on a wound VAC.  Unfortunately, there was arterial bleeding and there was skin edge bleeding and all of this was at the level that seemed to think that any positive pressure would be significant and so ultimately, he had a compressive dressing applied with the elevation.  We will potentially try wound VAC over the next several days, but certainly did not feel that it was safe to put this on the day.  He will be taken back to the floor and he will have his  dressing changed and assessments made for this.  The estimated blood loss for the procedure was probably 50 mL and the total tourniquet time was under 10 minutes, final can be gotten from anesthetic record.     Alta Corning, M.D.     Corliss Skains  D:  07/31/2016  T:  08/01/2016  Job:  [id number]

## 2016-08-01 NOTE — Progress Notes (Signed)
Pharmacy Antibiotic Note  [name] is a 46 y.o. male c/o swelling and foul odor of right foot admitted on 07/28/2016 with osteomyelitis.  Pharmacy has been consulted for vancomycin dosing; Zosyn per MD  On 7/12 patient underwent L 5th ray amputation with debridement of "skin, subcutaneous tissue, muscle, and fascia at the site of a complex wound infection" such that source control was not clearly established.  Today, 08/01/2016:  Afebrile  WBC remain elevated  SCr remains elevated but continues to improve; baseline unclear  Amp-sensitive E faecalis growing in superficial wound Cx; likely enterococcus in surgical Cx also  Plan:  Continue Zosyn per MD, dosing appropriate  Continue Vancomycin 1250 mg IV q24h (goal VT=15-20 mg/L)  As vancomycin LOT remains uncertain, will need trough tonight  Daily Scr  F/u cultures/levels  Dr. Sharol Given to see; will determine further treatment plans regarding antibiotics and LOT; based on culture results, would consider narrowing antibiotics to Unasyn, which will cover enterococcus and provide additional Gm neg and anaerobic coverage for possible polymicrobial infection  Height: 6' (182.9 cm) Weight: 262 lb (118.8 kg) IBW/kg (Calculated) : 77.6  Temp (24hrs), Avg:99.1 F (37.3 C), Min:98.3 F (36.8 C), Max:99.9 F (37.7 C)   Recent Labs Lab 07/28/16 1844 07/28/16 2315 07/29/16 1416 07/30/16 0518 07/31/16 0451 08/01/16 0529  WBC 22.7*  --   --  19.5* 20.9* 21.7*  CREATININE 2.73*  --  2.33* 2.07* 1.69* 1.39*  LATICACIDVEN  --  0.98  --   --   --   --     Estimated Creatinine Clearance: 88.4 mL/min (A) (by C-G formula based on SCr of 1.39 mg/dL (H)).    No Known Allergies  Antimicrobials this admission: 7/10 cefepime >> x1 ED 7/10 zosyn >> 7/10 vancomycin >>   Dose adjustments this admission: ---  Microbiology results: 7/9 BCx: NGTD 7/10 Wound (superficial): moderate E faecalis (S-Amp) with mixed organisms 7/12 surgical Cx  R toe: rare GPC in pairs  Thank you for allowing pharmacy to be a part of this patient's care.  Reuel Boom, PharmD, BCPS Pager: [phone] 08/01/2016, 12:35 PM

## 2016-08-01 NOTE — Progress Notes (Signed)
Subjective: 1 Day Post-Op Procedure(s) (LRB): AMPUTATION RIGHT FIFTH RAY AND APPLICATION OF WOUND VAC (Right) Patient reports pain as mild. We did not apply the wound VAC. We applied a pressure dressing soaked in Betadine.   Objective: Vital signs in last 24 hours: Temp:  [98.3 F (36.8 C)-99.9 F (37.7 C)] 98.3 F (36.8 C) (07/13 0601) Pulse Rate:  [78-85] 80 (07/13 0601) Resp:  [16-21] 17 (07/13 0601) BP: (114-139)/(68-76) 119/74 (07/13 0601) SpO2:  [96 %-100 %] 100 % (07/13 0601)  Intake/Output from previous day: 07/12 0701 - 07/13 0700 In: 1310 [P.O.:360; I.V.:500; IV Piggyback:450] Out: 0802 [Urine:1700; Blood:25] Intake/Output this shift: No intake/output data recorded.   Recent Labs  07/30/16 0518 07/31/16 0451 08/01/16 0529  HGB 8.8* 8.6* 8.3*    Recent Labs  07/31/16 0451 08/01/16 0529  WBC 20.9* 21.7*  RBC 3.43* 3.32*  HCT 25.6* 24.4*  PLT 446* 450*    Recent Labs  07/31/16 0451 08/01/16 0529  NA 136 136  K 4.2 4.2  CL 105 105  CO2 23 22  BUN 32* 26*  CREATININE 1.69* 1.39*  GLUCOSE 112* 120*  CALCIUM 8.7* 8.7*  Gram stain from right foot intraoperatively showed gram-positive cocci in pairs. Right foot exam: His bulky dressing is intact. No sign of drainage. Remaining 4 toes show good capillary refill without significant swelling.   Assessment/Plan: One day status post I&D right foot with fifth ray amputation for right foot infection with osteomyelitis. Plan: We'll consider application of irrigating VAC dressing today. May need to wait over the weekend. I will discuss this with the sales representative from the company. May be out of bed to chair putting weight on heel of right foot. Continue IV antibiotics. Dr. Berenice [name] will discuss the case with [name] Score MD and determine further treatment plans.  [name] 08/01/2016, 7:42 AM

## 2016-08-02 LAB — GLUCOSE, CAPILLARY
Glucose-Capillary: 138 mg/dL — ABNORMAL HIGH (ref 65–99)
Glucose-Capillary: 139 mg/dL — ABNORMAL HIGH (ref 65–99)
Glucose-Capillary: 119 mg/dL — ABNORMAL HIGH (ref 65–99)
Glucose-Capillary: 153 mg/dL — ABNORMAL HIGH (ref 65–99)

## 2016-08-02 NOTE — Progress Notes (Signed)
Pharmacy Antibiotic Note  [name] is a 46 y.o. male c/o swelling and foul odor of right foot admitted on 07/28/2016 with osteomyelitis.  Pharmacy has been consulted for vancomycin dosing; Zosyn per MD  On 7/12 patient underwent L 5th ray amputation with debridement of "skin, subcutaneous tissue, muscle, and fascia at the site of a complex wound infection" such that source control was not clearly established.  Today, 08/02/2016:  Afebrile  WBC remain elevated at 217  SCr remains elevated but continues to improve; now down to 1.39 baseline unclear  Amp-sensitive E faecalis growing in superficial wound Cx; likely enterococcus in surgical Cx also  VT at 2133 on 7/13 was 10 mcg/ml  Plan:  Continue Zosyn per MD, dosing appropriate  Increase Vancomycin 1750 mg IV q24h (goal VT=15-20 mg/L)  Daily Scr  F/u cultures/levels  Dr. Sharol Given to see; will determine further treatment plans regarding antibiotics and LOT; based on culture results, would consider narrowing antibiotics to Unasyn, which will cover enterococcus and provide additional Gm neg and anaerobic coverage for possible polymicrobial infection  Height: 6' (182.9 cm) Weight: 262 lb (118.8 kg) IBW/kg (Calculated) : 77.6  Temp (24hrs), Avg:98.7 F (37.1 C), Min:98.4 F (36.9 C), Max:99.1 F (37.3 C)   Recent Labs Lab 07/28/16 1844 07/28/16 2315 07/29/16 1416 07/30/16 0518 07/31/16 0451 08/01/16 0529 08/01/16 2133  WBC 22.7*  --   --  19.5* 20.9* 21.7*  --   CREATININE 2.73*  --  2.33* 2.07* 1.69* 1.39*  --   LATICACIDVEN  --  0.98  --   --   --   --   --   VANCOTROUGH  --   --   --   --   --   --  10*    Estimated Creatinine Clearance: 88.4 mL/min (A) (by C-G formula based on SCr of 1.39 mg/dL (H)).    No Known Allergies  Antimicrobials this admission: 7/10 cefepime >> x1 ED 7/10 zosyn >> 7/10 vancomycin >>   Dose adjustments this admission: ---  Microbiology results: 7/9 BCx: NGTD 7/10 Wound  (superficial): moderate E faecalis (S-Amp) with mixed organisms 7/12 surgical Cx R toe: rare GPC in pairs  Thank you for allowing pharmacy to be a part of this patient's care.  Royetta Asal, PharmD, BCPS Pager [phone] 08/02/2016 2:25 PM

## 2016-08-02 NOTE — Progress Notes (Signed)
Subjective: 2 Days Post-Op Procedure(s) (LRB): AMPUTATION RIGHT FIFTH RAY AND APPLICATION OF WOUND VAC (Right)  Activity level:  Bed to chair with little weight on heel only Diet tolerance:  ok Voiding:  ok Patient reports pain as mild.    Objective: Vital signs in last 24 hours: Temp:  [98.4 F (36.9 C)-98.7 F (37.1 C)] 98.7 F (37.1 C) (07/14 0536) Pulse Rate:  [77-82] 82 (07/14 0536) Resp:  [16-18] 16 (07/14 0536) BP: (128-139)/(73-85) 139/73 (07/14 0536) SpO2:  [100 %] 100 % (07/14 0536)  Labs:  Recent Labs  07/31/16 0451 08/01/16 0529  HGB 8.6* 8.3*    Recent Labs  07/31/16 0451 08/01/16 0529  WBC 20.9* 21.7*  RBC 3.43* 3.32*  HCT 25.6* 24.4*  PLT 446* 450*    Recent Labs  07/31/16 0451 08/01/16 0529  NA 136 136  K 4.2 4.2  CL 105 105  CO2 23 22  BUN 32* 26*  CREATININE 1.69* 1.39*  GLUCOSE 112* 120*  CALCIUM 8.7* 8.7*   No results for input(s): LABPT, INR in the last 72 hours.  Physical Exam:  Neurologically intact ABD soft Neurovascular intact Dorsiflexion/Plantar flexion intact No cellulitis present Compartment soft  Assessment/Plan:  2 Days Post-Op Procedure(s) (LRB): AMPUTATION RIGHT FIFTH RAY AND APPLICATION OF WOUND VAC (Right) Continue with bed to chair ambulation with little weight towards heel only. Continue current wound vac. Dr Berenice Primas or Clair Gulling will see him Monday and decided on further management. We greatly appreciate medical management.   Jacques Willingham, Larwance Sachs 08/02/2016, 8:34 AM

## 2016-08-02 NOTE — Progress Notes (Signed)
PROGRESS NOTE    [name]  KAJ:[id number] DOB: December 31, 1970 DOA: 07/28/2016 PCP: Heywood Bene, PA-C    Brief Narrative:  46 year old male who presented with right foot wound infection and drainage. Patient is known to have hypertension, dyslipidemia, type 2 diabetes mellitus, obesity, chronic kidney disease stage II. He reports a gradually worsening wound on his right foot for last 20 days. Initially seen by his primary care provider on June 6 and started on doxycycline, and posteriorly on Bactrim. Patient developed persistent symptoms, worsening wound and pusdrainage, associated with edema and difficulty walking,which prompted him to come to the emergency room for elevation. On his initial physical examination his blood pressure 105/69, heart rate 83, respiratory rate 17, oxygen saturation 99%. Moist mucous membranes, lungs clear to auscultation, heart S1-S2 present rhythmic, the abdomen was protuberant but no organomegaly or peritoneal signs, lower extremity with right ankle edema, positive erythema, ulcerated wound unstageable at the forefoot of the plantar region on the right, full smelling and positive pus drainage. Sodium 135, potassium 3.5, chloride 101, bicarbonate 24, glucose 76, BUN 39, creatinine 2.73, AST 119, ALT 103, alkaline phosphatase 181, total bilirubin's 1.0, white cell count 22.7, hemoglobin 9.7, hematocrit 28.2, platelets 430, sedimentation rate morning 140, CRP 22.9. Urinalysis with 6-30 white cells, 100 protein. Chest x-ray negative for infiltrates. Foot x-ray with worsening soft tissue ulcer and soft tissue air about the fifth metatarsophalangeal joint, decrease density of the fifth metatarsal head suspicion for osteomyelitis. EKG with normal sinus rhythm   Patient admitted to the hospital with the working diagnosis of right foot diabetic foot infection to rule out osteomyelitis.   Assessment & Plan:   Principal Problem:   Diabetic foot infection  ([place]) Active Problems:   Diabetes mellitus without complication ([place])   Dyslipidemia   HTN (hypertension)   Morbid obesity ([place])   Diabetic foot ulcer with osteomyelitis ([place])   Acute renal failure superimposed on stage 2 chronic kidney disease (HCC)   Anemia   1. Right foot osteomyelitis. SP PAMPUTATION RIGHT FIFTH RAY AND APPLICATION OF WOUND VAC (Right), pain well controlled, continue with IV vancomycin and zosyn, for the next 48 hours, will follow on cell count in am, temperature curve and cultures, continue to follow surgery recommendations.  2. Type 2 diabetes mellitus. Capillary glucose 126, 168, 155, 138, 139.  Will continue glucose cover and monitoring with sliding scale. Decreased appetite.   3. AKI on Chronic kidney disease stage 2. Off IV fluids, will follow on renal panel in am, continue to follow vancomycin levels per pharmacy protocol.   4. Dyslipidemia.  Atorvastatin and gemfibrozil.   5. Hypertension. Will continue to hold on hydrochlorothiazide and ramipril for now. Systolic blood pressure 035 to 130.   DVT prophylaxis:enoxaparin Code Status:full  Family Communication:I spoke with patient's wife at the bedside and all questions were addressed Disposition Plan:home    Consultants:  Orthopedica  Procedures:    Antimicrobials:   Vancomycin  Zosyn    Subjective: Decreased appetite, with no nausea or vomiting, no chest pain or dyspnea. No pain at the surgical wound.   Objective: Vitals:   08/01/16 0601 08/01/16 1503 08/01/16 2045 08/02/16 0536  BP: 119/74 128/73 137/85 139/73  Pulse: 80 77 79 82  Resp: 17 18 17 16   Temp: 98.3 F (36.8 C) 98.4 F (36.9 C) 98.6 F (37 C) 98.7 F (37.1 C)  TempSrc: Oral Oral Oral Oral  SpO2: 100% 100% 100% 100%  Weight:      Height:  Intake/Output Summary (Last 24 hours) at 08/02/16 1221 Last data filed at 08/02/16 0538  Gross per 24 hour  Intake              900 ml  Output              1850 ml  Net             -950 ml   Filed Weights   07/28/16 1831  Weight: 118.8 kg (262 lb)    Examination:  General exam: not in pain or dyspnea E ENT: no pallor or icterus, oral mucosa moist.  Respiratory system: Clear to auscultation. Respiratory effort normal. No wheezing, rales or rhonchi.  Cardiovascular system: S1 & S2 heard, RRR. No JVD, murmurs, rubs, gallops or clicks. No pedal edema. Gastrointestinal system: Abdomen is nondistended, soft and nontender. No organomegaly or masses felt. Normal bowel sounds heard. Central nervous system: Alert and oriented. No focal neurological deficits. Extremities: Symmetric 5 x 5 power. Skin: right foot with dressing in place, positive wound vac.     Data Reviewed: I have personally reviewed following labs and imaging studies  CBC:  Recent Labs Lab 07/28/16 1844 07/30/16 0518 07/31/16 0451 08/01/16 0529  WBC 22.7* 19.5* 20.9* 21.7*  NEUTROABS 18.6* 15.8* 16.9* 17.4*  HGB 9.7* 8.8* 8.6* 8.3*  HCT 28.2* 26.3* 25.6* 24.4*  MCV 75.2* 74.7* 74.6* 73.5*  PLT 430* 432* 446* 161*   Basic Metabolic Panel:  Recent Labs Lab 07/28/16 1844 07/29/16 1416 07/30/16 0518 07/31/16 0451 08/01/16 0529  NA 135  --  137 136 136  K 3.5  --  4.1 4.2 4.2  CL 101  --  106 105 105  CO2 24  --  21* 23 22  GLUCOSE 76  --  96 112* 120*  BUN 39*  --  36* 32* 26*  CREATININE 2.73* 2.33* 2.07* 1.69* 1.39*  CALCIUM 9.2  --  8.8* 8.7* 8.7*   GFR: Estimated Creatinine Clearance: 88.4 mL/min (A) (by C-G formula based on SCr of 1.39 mg/dL (H)). Liver Function Tests:  Recent Labs Lab 07/28/16 1844  AST 119*  ALT 103*  ALKPHOS 181*  BILITOT 1.0  PROT 8.7*  ALBUMIN 3.4*    Recent Labs Lab 07/28/16 1844  LIPASE 18   No results for input(s): AMMONIA in the last 168 hours. Coagulation Profile:  Recent Labs Lab 07/28/16 2304  INR 1.15   Cardiac Enzymes: No results for input(s): CKTOTAL, CKMB, CKMBINDEX, TROPONINI in the last 168  hours. BNP (last 3 results) No results for input(s): PROBNP in the last 8760 hours. HbA1C: No results for input(s): HGBA1C in the last 72 hours. CBG:  Recent Labs Lab 08/01/16 0714 08/01/16 1217 08/01/16 1734 08/01/16 2043 08/02/16 0727  GLUCAP 100* 126* 168* 155* 138*   Lipid Profile: No results for input(s): CHOL, HDL, LDLCALC, TRIG, CHOLHDL, LDLDIRECT in the last 72 hours. Thyroid Function Tests: No results for input(s): TSH, T4TOTAL, FREET4, T3FREE, THYROIDAB in the last 72 hours. Anemia Panel: No results for input(s): VITAMINB12, FOLATE, FERRITIN, TIBC, IRON, RETICCTPCT in the last 72 hours. Sepsis Labs:  Recent Labs Lab 07/28/16 2315  LATICACIDVEN 0.98    Recent Results (from the past 240 hour(s))  Culture, blood (Routine x 2)     Status: None (Preliminary result)   Collection Time: 07/28/16 11:06 PM  Result Value Ref Range Status   Specimen Description BLOOD BLOOD RIGHT HAND  Final   Special Requests   Final    BOTTLES  DRAWN AEROBIC AND ANAEROBIC Blood Culture results may not be optimal due to an inadequate volume of blood received in culture bottles   Culture   Final    NO GROWTH 4 DAYS Performed at [facility] [address] [place], [place] [address]    Report Status PENDING  Incomplete  Wound or Superficial Culture     Status: None   Collection Time: 07/28/16 11:06 PM  Result Value Ref Range Status   Specimen Description FOOT RIGHT  Final   Special Requests NONE  Final   Gram Stain   Final    NO WBC SEEN FEW GRAM POSITIVE COCCI FEW GRAM NEGATIVE RODS    Culture   Final    MODERATE ENTEROCOCCUS FAECALIS WITHIN MIXED ORGANISMS Performed at [facility], [place] [address], [place], [place] [address]    Report Status 08/01/2016 FINAL  Final   Organism ID, Bacteria ENTEROCOCCUS FAECALIS  Final      Susceptibility   Enterococcus faecalis - MIC*    AMPICILLIN <=2 SENSITIVE Sensitive     VANCOMYCIN 1 SENSITIVE Sensitive     GENTAMICIN  SYNERGY SENSITIVE Sensitive     * MODERATE ENTEROCOCCUS FAECALIS  Culture, blood (Routine x 2)     Status: None (Preliminary result)   Collection Time: 07/28/16 11:45 PM  Result Value Ref Range Status   Specimen Description BLOOD LEFT ANTECUBITAL  Final   Special Requests   Final    BOTTLES DRAWN AEROBIC AND ANAEROBIC Blood Culture adequate volume   Culture   Final    NO GROWTH 4 DAYS Performed at [facility], [place] [address] [place], [place] [address]    Report Status PENDING  Incomplete  Surgical PCR screen     Status: None   Collection Time: 07/30/16 10:17 PM  Result Value Ref Range Status   MRSA, PCR NEGATIVE NEGATIVE Final   Staphylococcus aureus NEGATIVE NEGATIVE Final    Comment:        The Xpert SA Assay (FDA approved for NASAL specimens in patients over 40 years of age), is one component of a comprehensive surveillance program.  Test performance has been validated by [facility] for patients greater than or equal to 27 year old. It is not intended to diagnose infection nor to guide or monitor treatment.   Aerobic/Anaerobic Culture (surgical/deep wound)     Status: None (Preliminary result)   Collection Time: 07/31/16  1:38 PM  Result Value Ref Range Status   Specimen Description TOE RIGHT  Final   Special Requests NONE  Final   Gram Stain   Final    ABUNDANT WBC PRESENT,BOTH PMN AND MONONUCLEAR RARE GRAM POSITIVE COCCI IN PAIRS    Culture   Final    CULTURE REINCUBATED FOR BETTER GROWTH Performed at [facility], [place] [address] [place],  [address]    Report Status PENDING  Incomplete         Radiology Studies: No results found.      Scheduled Meds: . aspirin EC  81 mg Oral Daily  . atorvastatin  40 mg Oral q1800  . gemfibrozil  600 mg Oral BID AC  . insulin aspart  0-9 Units Subcutaneous TID WC  . multivitamin with minerals  1 tablet Oral BID  . omega-3 acid ethyl esters  1 g Oral Daily  . vitamin B-12  5,000 mcg Oral  Daily   Continuous Infusions: . piperacillin-tazobactam (ZOSYN)  IV Stopped (08/02/16 6333)  .  vancomycin Stopped (08/01/16 2339)     LOS: 4 days        Mauricio Gerome Apley, MD [facility] Pager [phone]  If 7PM-7AM, please contact night-coverage [email or web address] Password [facility] 08/02/2016, 12:21 PM

## 2016-08-03 DIAGNOSIS — M86171 Other acute osteomyelitis, right ankle and foot: Secondary | ICD-10-CM

## 2016-08-03 LAB — GLUCOSE, CAPILLARY
Glucose-Capillary: 126 mg/dL — ABNORMAL HIGH (ref 65–99)
Glucose-Capillary: 171 mg/dL — ABNORMAL HIGH (ref 65–99)
Glucose-Capillary: 163 mg/dL — ABNORMAL HIGH (ref 65–99)
Glucose-Capillary: 114 mg/dL — ABNORMAL HIGH (ref 65–99)

## 2016-08-03 LAB — CBC WITH DIFFERENTIAL/PLATELET
Basophils Absolute: 0.1 10*3/uL (ref 0.0–0.1)
Basophils Relative: 0 %
Eosinophils Absolute: 0.3 10*3/uL (ref 0.0–0.7)
Eosinophils Relative: 2 %
HCT: 27 % — ABNORMAL LOW (ref 39.0–52.0)
Hemoglobin: 9 g/dL — ABNORMAL LOW (ref 13.0–17.0)
Lymphocytes Relative: 15 %
Lymphs Abs: 2.6 10*3/uL (ref 0.7–4.0)
MCH: 24.8 pg — ABNORMAL LOW (ref 26.0–34.0)
MCHC: 33.3 g/dL (ref 30.0–36.0)
MCV: 74.4 fL — ABNORMAL LOW (ref 78.0–100.0)
Monocytes Absolute: 1.2 10*3/uL — ABNORMAL HIGH (ref 0.1–1.0)
Monocytes Relative: 7 %
Neutro Abs: 12.6 10*3/uL — ABNORMAL HIGH (ref 1.7–7.7)
Neutrophils Relative %: 76 %
Platelets: 456 10*3/uL — ABNORMAL HIGH (ref 150–400)
RBC: 3.63 MIL/uL — ABNORMAL LOW (ref 4.22–5.81)
RDW: 13.5 % (ref 11.5–15.5)
WBC: 16.7 10*3/uL — ABNORMAL HIGH (ref 4.0–10.5)

## 2016-08-03 LAB — BASIC METABOLIC PANEL
Anion gap: 9 (ref 5–15)
BUN: 21 mg/dL — ABNORMAL HIGH (ref 6–20)
CO2: 24 mmol/L (ref 22–32)
Calcium: 8.9 mg/dL (ref 8.9–10.3)
Chloride: 103 mmol/L (ref 101–111)
Creatinine, Ser: 1.29 mg/dL — ABNORMAL HIGH (ref 0.61–1.24)
GFR calc Af Amer: >60 mL/min (ref >60)
GFR calc non Af Amer: >60 mL/min (ref >60)
Glucose, Bld: 151 mg/dL — ABNORMAL HIGH (ref 65–99)
Potassium: 4.7 mmol/L (ref 3.5–5.1)
Sodium: 136 mmol/L (ref 135–145)

## 2016-08-03 LAB — CULTURE, BLOOD (ROUTINE X 2)
Culture: NO GROWTH
Culture: NO GROWTH
Special Requests: ADEQUATE

## 2016-08-03 MED ORDER — SODIUM CHLORIDE 0.9 % IV SOLN
1750.0000 mg | INTRAVENOUS | Status: DC
  Administered 2016-08-03 – 2016-08-05 (×3): 1750 mg via INTRAVENOUS
  Filled 2016-08-03: qty 1750
  Filled 2016-08-03 (×3): qty 1000

## 2016-08-03 MED ORDER — VANCOMYCIN HCL IN DEXTROSE 750-5 MG/150ML-% IV SOLN: 750.0000 mg | INTRAVENOUS | Status: DC

## 2016-08-03 NOTE — Progress Notes (Addendum)
PROGRESS NOTE    [name]  CXK:[id number] DOB: 08/03/1970 DOA: 07/28/2016 PCP: Heywood Bene, PA-C     Brief Narrative:  46 year old male who presented with right foot wound infection and drainage. Patient is known to have hypertension, dyslipidemia, type 2 diabetes mellitus, obesity, chronic kidney disease stage II. He reports a gradually worsening wound on his right foot for last 20 days. Initially seen by his primary care provider on June 6 and started on doxycycline, and posteriorly on Bactrim. Patient developed persistent symptoms, worsening wound and pusdrainage, associated with edema and difficulty walking,which prompted him to come to the emergency room for elevation. On his initial physical examination his blood pressure 105/69, heart rate 83, respiratory rate 17, oxygen saturation 99%. Moist mucous membranes, lungs clear to auscultation, heart S1-S2 present rhythmic, the abdomen was protuberant but no organomegaly or peritoneal signs, lower extremity with right ankle edema, positive erythema, ulcerated wound unstageable at the forefoot of the plantar region on the right, full smelling and positive pus drainage. Sodium 135, potassium 3.5, chloride 101, bicarbonate 24, glucose 76, BUN 39, creatinine 2.73, AST 119, ALT 103, alkaline phosphatase 181, total bilirubin's 1.0, white cell count 22.7, hemoglobin 9.7, hematocrit 28.2, platelets 430, sedimentation rate morning 140, CRP 22.9. Urinalysis with 6-30 white cells, 100 protein. Chest x-ray negative for infiltrates. Foot x-ray with worsening soft tissue ulcer and soft tissue air about the fifth metatarsophalangeal joint, decrease density of the fifth metatarsal head suspicion for osteomyelitis. EKG with normal sinus rhythm   Patient admitted to the hospital with the working diagnosis of right foot diabetic foot infection to rule out osteomyelitis. He underwent amputation of the fifth ray and application of wound vac. Responding  well to medical therapy, will follow on orthopedic recommendations for discharge planning.    Assessment & Plan:   Principal Problem:   Diabetic foot infection ([place]) Active Problems:   Diabetes mellitus without complication ([place])   Dyslipidemia   HTN (hypertension)   Morbid obesity ([place])   Diabetic foot ulcer with osteomyelitis ([place])   Acute renal failure superimposed on stage 2 chronic kidney disease (HCC)   Anemia   1. Right foot osteomyelitis. SP AMPUTATION RIGHT FIFTH RAY AND APPLICATION OF WOUND VAC (Right), pain continue to be well controlled, on IV vancomycin and zosyn, waiting for orthopedic follow up in am, before discharge planning. Wbc down to 16.   2. Type 2 diabetes mellitus. Capillary glucose 139, 119, 153, 126, 171. Glucose cover and monitoring with sliding scale.   3. AKI on Chronic kidney disease stage 2. Renal function continue to improve, serum cr at 1,39 with K at 4,2 and serum bicarbonate at 22.  Will follow on renal panel in am, follow vancomycin levels per pharmacy protocol   4. Dyslipidemia.  Atorvastatinand gemfibrozil, tolerating well.   5. Hypertension. Systolic blood pressure 970 to 130, will continue to hold  hydrochlorothiazide and ramipril.   DVT prophylaxis:enoxaparin Code Status:full  Family Communication:I spoke with patient's wife at the bedside and all questions were addressed Disposition Plan:home    Consultants:  Orthopedica  Procedures:    Antimicrobials:   Vancomycin  Zosyn     Subjective: Patient out of bed to chair, no nausea or vomiting, foot pain well controlled, tolerating well wound vac. No fever or chills, no chest pain or dyspnea.   Objective: Vitals:   08/02/16 0536 08/02/16 1305 08/02/16 2106 08/03/16 0528  BP: 139/73 117/74 127/75 132/75  Pulse: 82 73 94 77  Resp: 16  15 16 14   Temp: 98.7 F (37.1 C) 99.1 F (37.3 C) 99 F (37.2 C) 99.3 F (37.4 C)  TempSrc: Oral Oral Oral Oral  SpO2:  100% 100% 99% 100%  Weight:      Height:        Intake/Output Summary (Last 24 hours) at 08/03/16 1204 Last data filed at 08/03/16 0932  Gross per 24 hour  Intake             1240 ml  Output             3200 ml  Net            -1960 ml   Filed Weights   07/28/16 1831  Weight: 118.8 kg (262 lb)    Examination:  General exam: not in pain or dyspnea E ENT: no pallor or icterus  Respiratory system: Clear to auscultation. Respiratory effort normal. No wheezing, rales or rhonchi.  Cardiovascular system: S1 & S2 heard, RRR. No JVD, murmurs, rubs, gallops or clicks. No pedal edema. Gastrointestinal system: Abdomen is nondistended, soft and nontender. No organomegaly or masses felt. Normal bowel sounds heard. Central nervous system: Alert and oriented. No focal neurological deficits. Extremities: Symmetric 5 x 5 power. Skin: right foot with wound vac in place. Dressing in place, no edema.      Data Reviewed: I have personally reviewed following labs and imaging studies  CBC:  Recent Labs Lab 07/28/16 1844 07/30/16 0518 07/31/16 0451 08/01/16 0529 08/03/16 0534  WBC 22.7* 19.5* 20.9* 21.7* 16.7*  NEUTROABS 18.6* 15.8* 16.9* 17.4* 12.6*  HGB 9.7* 8.8* 8.6* 8.3* 9.0*  HCT 28.2* 26.3* 25.6* 24.4* 27.0*  MCV 75.2* 74.7* 74.6* 73.5* 74.4*  PLT 430* 432* 446* 450* 169*   Basic Metabolic Panel:  Recent Labs Lab 07/28/16 1844 07/29/16 1416 07/30/16 0518 07/31/16 0451 08/01/16 0529 08/03/16 0534  NA 135  --  137 136 136 136  K 3.5  --  4.1 4.2 4.2 4.7  CL 101  --  106 105 105 103  CO2 24  --  21* 23 22 24   GLUCOSE 76  --  96 112* 120* 151*  BUN 39*  --  36* 32* 26* 21*  CREATININE 2.73* 2.33* 2.07* 1.69* 1.39* 1.29*  CALCIUM 9.2  --  8.8* 8.7* 8.7* 8.9   GFR: Estimated Creatinine Clearance: 95.2 mL/min (A) (by C-G formula based on SCr of 1.29 mg/dL (H)). Liver Function Tests:  Recent Labs Lab 07/28/16 1844  AST 119*  ALT 103*  ALKPHOS 181*  BILITOT 1.0    PROT 8.7*  ALBUMIN 3.4*    Recent Labs Lab 07/28/16 1844  LIPASE 18   No results for input(s): AMMONIA in the last 168 hours. Coagulation Profile:  Recent Labs Lab 07/28/16 2304  INR 1.15   Cardiac Enzymes: No results for input(s): CKTOTAL, CKMB, CKMBINDEX, TROPONINI in the last 168 hours. BNP (last 3 results) No results for input(s): PROBNP in the last 8760 hours. HbA1C: No results for input(s): HGBA1C in the last 72 hours. CBG:  Recent Labs Lab 08/02/16 0727 08/02/16 1227 08/02/16 1630 08/02/16 2110 08/03/16 0707  GLUCAP 138* 139* 119* 153* 126*   Lipid Profile: No results for input(s): CHOL, HDL, LDLCALC, TRIG, CHOLHDL, LDLDIRECT in the last 72 hours. Thyroid Function Tests: No results for input(s): TSH, T4TOTAL, FREET4, T3FREE, THYROIDAB in the last 72 hours. Anemia Panel: No results for input(s): VITAMINB12, FOLATE, FERRITIN, TIBC, IRON, RETICCTPCT in the last 72 hours. Sepsis Labs:  Recent Labs Lab 07/28/16 2315  LATICACIDVEN 0.98    Recent Results (from the past 240 hour(s))  Culture, blood (Routine x 2)     Status: None (Preliminary result)   Collection Time: 07/28/16 11:06 PM  Result Value Ref Range Status   Specimen Description BLOOD BLOOD RIGHT HAND  Final   Special Requests   Final    BOTTLES DRAWN AEROBIC AND ANAEROBIC Blood Culture results may not be optimal due to an inadequate volume of blood received in culture bottles   Culture   Final    NO GROWTH 4 DAYS Performed at [facility], [place] [address] [place], [place] [address]    Report Status PENDING  Incomplete  Wound or Superficial Culture     Status: None   Collection Time: 07/28/16 11:06 PM  Result Value Ref Range Status   Specimen Description FOOT RIGHT  Final   Special Requests NONE  Final   Gram Stain   Final    NO WBC SEEN FEW GRAM POSITIVE COCCI FEW GRAM NEGATIVE RODS    Culture   Final    MODERATE ENTEROCOCCUS FAECALIS WITHIN MIXED ORGANISMS Performed at [facility], [place] [address] [place], [place] [address]    Report Status 08/01/2016 FINAL  Final   Organism ID, Bacteria ENTEROCOCCUS FAECALIS  Final      Susceptibility   Enterococcus faecalis - MIC*    AMPICILLIN <=2 SENSITIVE Sensitive     VANCOMYCIN 1 SENSITIVE Sensitive     GENTAMICIN SYNERGY SENSITIVE Sensitive     * MODERATE ENTEROCOCCUS FAECALIS  Culture, blood (Routine x 2)     Status: None (Preliminary result)   Collection Time: 07/28/16 11:45 PM  Result Value Ref Range Status   Specimen Description BLOOD LEFT ANTECUBITAL  Final   Special Requests   Final    BOTTLES DRAWN AEROBIC AND ANAEROBIC Blood Culture adequate volume   Culture   Final    NO GROWTH 4 DAYS Performed at [facility], [place] [address], [place], [place] [address]    Report Status PENDING  Incomplete  Surgical PCR screen     Status: None   Collection Time: 07/30/16 10:17 PM  Result Value Ref Range Status   MRSA, PCR NEGATIVE NEGATIVE Final   Staphylococcus aureus NEGATIVE NEGATIVE Final    Comment:        The Xpert SA Assay (FDA approved for NASAL specimens in patients over 88 years of age), is one component of a comprehensive surveillance program.  Test performance has been validated by [facility] for patients greater than or equal to 27 year old. It is not intended to diagnose infection nor to guide or monitor treatment.   Aerobic/Anaerobic Culture (surgical/deep wound)     Status: None (Preliminary result)   Collection Time: 07/31/16  1:38 PM  Result Value Ref Range Status   Specimen Description TOE RIGHT  Final   Special Requests NONE  Final   Gram Stain   Final    ABUNDANT WBC PRESENT,BOTH PMN AND MONONUCLEAR RARE GRAM POSITIVE COCCI IN PAIRS    Culture   Final    CULTURE REINCUBATED FOR BETTER GROWTH HOLDING FOR POSSIBLE ANAEROBE Performed at [facility], [place] [address] [place], [place] [address]    Report Status PENDING  Incomplete         Radiology  Studies: No results found.      Scheduled Meds: . aspirin EC  81 mg Oral Daily  .  atorvastatin  40 mg Oral q1800  . gemfibrozil  600 mg Oral BID AC  . insulin aspart  0-9 Units Subcutaneous TID WC  . multivitamin with minerals  1 tablet Oral BID  . omega-3 acid ethyl esters  1 g Oral Daily  . vitamin B-12  5,000 mcg Oral Daily   Continuous Infusions: . piperacillin-tazobactam (ZOSYN)  IV 3.375 g (08/03/16 0527)  . vancomycin       LOS: 5 days      Tawni Millers, MD [facility] Pager [phone]  If 7PM-7AM, please contact night-coverage [email or web address] Password [facility] 08/03/2016, 12:04 PM

## 2016-08-04 LAB — BASIC METABOLIC PANEL
Anion gap: 9 (ref 5–15)
BUN: 18 mg/dL (ref 6–20)
CO2: 26 mmol/L (ref 22–32)
Calcium: 8.9 mg/dL (ref 8.9–10.3)
Chloride: 102 mmol/L (ref 101–111)
Creatinine, Ser: 1.35 mg/dL — ABNORMAL HIGH (ref 0.61–1.24)
GFR calc Af Amer: >60 mL/min (ref >60)
GFR calc non Af Amer: >60 mL/min (ref >60)
Glucose, Bld: 151 mg/dL — ABNORMAL HIGH (ref 65–99)
Potassium: 4.5 mmol/L (ref 3.5–5.1)
Sodium: 137 mmol/L (ref 135–145)

## 2016-08-04 LAB — CBC WITH DIFFERENTIAL/PLATELET
Basophils Absolute: 0 10*3/uL (ref 0.0–0.1)
Basophils Relative: 0 %
Eosinophils Absolute: 0.3 10*3/uL (ref 0.0–0.7)
Eosinophils Relative: 2 %
HCT: 26.1 % — ABNORMAL LOW (ref 39.0–52.0)
Hemoglobin: 8.6 g/dL — ABNORMAL LOW (ref 13.0–17.0)
Lymphocytes Relative: 19 %
Lymphs Abs: 3 10*3/uL (ref 0.7–4.0)
MCH: 24.6 pg — ABNORMAL LOW (ref 26.0–34.0)
MCHC: 33 g/dL (ref 30.0–36.0)
MCV: 74.8 fL — ABNORMAL LOW (ref 78.0–100.0)
Monocytes Absolute: 1 10*3/uL (ref 0.1–1.0)
Monocytes Relative: 6 %
Neutro Abs: 11.6 10*3/uL — ABNORMAL HIGH (ref 1.7–7.7)
Neutrophils Relative %: 73 %
Platelets: 403 10*3/uL — ABNORMAL HIGH (ref 150–400)
RBC: 3.49 MIL/uL — ABNORMAL LOW (ref 4.22–5.81)
RDW: 13.6 % (ref 11.5–15.5)
WBC: 15.9 10*3/uL — ABNORMAL HIGH (ref 4.0–10.5)

## 2016-08-04 LAB — GLUCOSE, CAPILLARY
Glucose-Capillary: 148 mg/dL — ABNORMAL HIGH (ref 65–99)
Glucose-Capillary: 135 mg/dL — ABNORMAL HIGH (ref 65–99)
Glucose-Capillary: 134 mg/dL — ABNORMAL HIGH (ref 65–99)
Glucose-Capillary: 153 mg/dL — ABNORMAL HIGH (ref 65–99)

## 2016-08-04 MED ORDER — ENOXAPARIN SODIUM 30 MG/0.3ML ~~LOC~~ SOLN
30.0000 mg | Freq: Two times a day (BID) | SUBCUTANEOUS | Status: DC
  Administered 2016-08-04 – 2016-08-07 (×8): 30 mg via SUBCUTANEOUS
  Filled 2016-08-04 (×8): qty 0.3

## 2016-08-04 NOTE — Progress Notes (Addendum)
Patient ID: [name], male   DOB: 1970/03/17, 46 y.o.   MRN: [id number]    PROGRESS NOTE    [name]  SAY:[id number] DOB: Jun 04, 1970 DOA: 07/28/2016  PCP: Heywood Bene, PA-C   Brief Narrative:  Pt is 46 yo male with known DM type II and CKD stage II, presented with right foot infection. XARY on admission worrisome for fifth right foot toe metatarsal osteomyelitis. Patient started on Vanc and Zosyn and TRH asked to admit for further evaluation.    Assessment & Plan:  Right foot osteomyelitis, leukocytosis  would culture positive for Enterococcus Faecalis, sensitive to Vancomycin  - s/p right fifth ray amputation, application of wound vac, post op day #4 - recommended bed to chair ambulation with little weight towards heel only - ortho to see today and provide further recommendations - provide analgesia as needed  - keep on Vancomycin and Zosyn for now based on sensitivity report, suspect we can d/c Zosyn, narrow ABX down if ortho team agrees  - WBC is overall trending down, will repeat CBC in AM - VAC in place, lateral right foot wound area, no bleeding noted on exam  - plan to irrigate with 30 mL of saline for 10 minutes and suction x3-1/2 hours in an alternating fashion  - leave dressing in place 24-48 more hours and will see what ortho recommends today   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 Iron deficiency, microcytic - Hg remains ~ 8-9 - no evidence of active bleeding  - CBC in AM  Obesity  - Body mass index is 35.53 kg/m.  DVT  prophylaxis: Lovenox SQ Code Status: Full  Family Communication: Patient at bedside  Disposition Plan: to be determined after ortho team provides recommendations   Consultants:   Ortho  Procedures:   7/12 - Dr. Berenice Primas performed right fifth ray amputation, debridement of ths skin, subQ tissue, muscle, fasca at the site of complex wound infection   Antimicrobials:   Vancomycin and Zosyn 7/10 -->  Subjective: Pt reports feeling better this AM, still with intermittent pain in the right foot.   Objective: Vitals:   08/02/16 2106 08/03/16 0528 08/03/16 1321 08/03/16 2100  BP: 127/75 132/75 120/72 115/69  Pulse: 94 77 78 77  Resp: 16 14 16 18   Temp: 99 F (37.2 C) 99.3 F (37.4 C) 98.7 F (37.1 C) 98.7 F (37.1 C)  TempSrc: Oral Oral Oral Oral  SpO2: 99% 100% 100% 100%  Weight:      Height:        Intake/Output Summary (Last 24 hours) at 08/04/16 0904 Last data filed at 08/04/16 0600  Gross per 24 hour  Intake             1010 ml  Output              700 ml  Net              310 ml   Filed Weights   07/28/16 1831  Weight: 118.8 kg (262  lb)    Examination:  General exam: Appears calm and comfortable  Respiratory system: Clear to auscultation. Respiratory effort normal. Cardiovascular system: S1 & S2 heard, RRR. No JVD, murmurs, rubs, gallops or clicks. No pedal edema. Gastrointestinal system: Abdomen is nondistended, soft and nontender. No organomegaly or masses felt.  Central nervous system: Alert and oriented. No focal neurological deficits. Extremities: Symmetric 5 x 5 power. Wound vac on right lat foot  Data Reviewed: I have personally reviewed following labs and imaging studies  CBC:  Recent Labs Lab 07/30/16 0518 07/31/16 0451 08/01/16 0529 08/03/16 0534 08/04/16 0510  WBC 19.5* 20.9* 21.7* 16.7* 15.9*  NEUTROABS 15.8* 16.9* 17.4* 12.6* 11.6*  HGB 8.8* 8.6* 8.3* 9.0* 8.6*  HCT 26.3* 25.6* 24.4* 27.0* 26.1*  MCV 74.7* 74.6* 73.5* 74.4* 74.8*  PLT  432* 446* 450* 456* 664*   Basic Metabolic Panel:  Recent Labs Lab 07/30/16 0518 07/31/16 0451 08/01/16 0529 08/03/16 0534 08/04/16 0510  NA 137 136 136 136 137  K 4.1 4.2 4.2 4.7 4.5  CL 106 105 105 103 102  CO2 21* 23 22 24 26   GLUCOSE 96 112* 120* 151* 151*  BUN 36* 32* 26* 21* 18  CREATININE 2.07* 1.69* 1.39* 1.29* 1.35*  CALCIUM 8.8* 8.7* 8.7* 8.9 8.9   Liver Function Tests:  Recent Labs Lab 07/28/16 1844  AST 119*  ALT 103*  ALKPHOS 181*  BILITOT 1.0  PROT 8.7*  ALBUMIN 3.4*    Recent Labs Lab 07/28/16 1844  LIPASE 18   Coagulation Profile:  Recent Labs Lab 07/28/16 2304  INR 1.15   CBG:  Recent Labs Lab 08/03/16 0707 08/03/16 1319 08/03/16 1726 08/03/16 2219 08/04/16 0804  GLUCAP 126* 171* 163* 114* 148*   Urine analysis:    Component Value Date/Time   COLORURINE AMBER (A) 07/28/2016 1846   APPEARANCEUR CLOUDY (A) 07/28/2016 1846   LABSPEC 1.021 07/28/2016 1846   PHURINE 5.0 07/28/2016 1846   GLUCOSEU NEGATIVE 07/28/2016 1846   HGBUR NEGATIVE 07/28/2016 1846   BILIRUBINUR NEGATIVE 07/28/2016 1846   KETONESUR NEGATIVE 07/28/2016 1846   PROTEINUR 100 (A) 07/28/2016 1846   UROBILINOGEN 0.2 01/26/2007 1759   NITRITE NEGATIVE 07/28/2016 1846   LEUKOCYTESUR NEGATIVE 07/28/2016 1846   Recent Results (from the past 240 hour(s))  Culture, blood (Routine x 2)     Status: None   Collection Time: 07/28/16 11:06 PM  Result Value Ref Range Status   Specimen Description BLOOD BLOOD RIGHT HAND  Final   Special Requests   Final    BOTTLES DRAWN AEROBIC AND ANAEROBIC Blood Culture results may not be optimal due to an inadequate volume of blood received in culture bottles   Culture   Final    NO GROWTH 5 DAYS Performed at [facility], [place] [address] [place], [place] [address]    Report Status 08/03/2016 FINAL  Final  Wound or Superficial Culture     Status: None   Collection Time: 07/28/16 11:06 PM  Result Value Ref Range Status    Specimen Description FOOT RIGHT  Final   Special Requests NONE  Final   Gram Stain   Final    NO WBC SEEN FEW GRAM POSITIVE COCCI FEW GRAM NEGATIVE RODS    Culture   Final    MODERATE ENTEROCOCCUS FAECALIS WITHIN MIXED ORGANISMS Performed at [facility], [place] [address], [place], [place] [address]    Report Status 08/01/2016 FINAL  Final   Organism ID, Bacteria ENTEROCOCCUS FAECALIS  Final      Susceptibility   Enterococcus faecalis - MIC*    AMPICILLIN <=2 SENSITIVE Sensitive     VANCOMYCIN 1 SENSITIVE Sensitive     GENTAMICIN SYNERGY SENSITIVE Sensitive     * MODERATE ENTEROCOCCUS FAECALIS  Culture, blood (Routine x 2)     Status: None   Collection Time: 07/28/16 11:45 PM  Result Value Ref Range Status   Specimen Description BLOOD LEFT ANTECUBITAL  Final   Special Requests   Final    BOTTLES DRAWN AEROBIC AND ANAEROBIC Blood Culture adequate volume   Culture   Final    NO GROWTH 5 DAYS Performed at [facility], [place] [address], [place], [place] [address]    Report Status 08/03/2016 FINAL  Final  Surgical PCR screen     Status: None   Collection Time: 07/30/16 10:17 PM  Result Value Ref Range Status   MRSA, PCR NEGATIVE NEGATIVE Final   Staphylococcus aureus NEGATIVE NEGATIVE Final  Aerobic/Anaerobic Culture (surgical/deep wound)     Status: None (Preliminary result)   Collection Time: 07/31/16  1:38 PM  Result Value Ref Range Status   Specimen Description TOE RIGHT  Final   Special Requests NONE  Final   Gram Stain   Final    ABUNDANT WBC PRESENT,BOTH PMN AND MONONUCLEAR RARE GRAM POSITIVE COCCI IN PAIRS    Culture   Final    CULTURE REINCUBATED FOR BETTER GROWTH HOLDING FOR POSSIBLE ANAEROBE Performed at [facility], [place] [address] [place],  [address]    Report Status PENDING  Incomplete    Radiology Studies: No results found.  Scheduled Meds: . aspirin EC  81 mg Oral Daily  . atorvastatin  40 mg Oral q1800  . gemfibrozil  600 mg  Oral BID AC  . insulin aspart  0-9 Units Subcutaneous TID WC  . multivitamin with minerals  1 tablet Oral BID  . omega-3 acid ethyl esters  1 g Oral Daily  . vitamin B-12  5,000 mcg Oral Daily   Continuous Infusions: . piperacillin-tazobactam (ZOSYN)  IV 3.375 g (08/04/16 0520)  . vancomycin Stopped (08/03/16 2327)     LOS: 6 days   Time spent: 25 minutes   Faye Ramsay, MD [facility] Pager [phone]  If 7PM-7AM, please contact night-coverage [email or web address] Password [email or web address] 08/04/2016, 9:04 AM

## 2016-08-04 NOTE — Consult Note (Signed)
[facility] Nurse wound consult note Reason for Consult: Wound VAC in place after right fifth ray amputation. Post op day 4.  MD orders from today are as follows:  Right foot osteomyelitis, leukocytosis  would culture positive for Enterococcus Faecalis, sensitive to Vancomycin  - s/p right fifth ray amputation, application of wound vac, post op day #4 - recommended bed to chair ambulation with little weight towards heel only - ortho to see today and provide further recommendations - provide analgesia as needed  - keep on Vancomycin and Zosyn for now based on sensitivity report, suspect we can d/c Zosyn, narrow ABX down if ortho team agrees  - WBC is overall trending down, will repeat CBC in AM - VAC in place, lateral right foot wound area, no bleeding noted on exam  - plan to irrigate with 30 mL of saline for 10 minutes and suction x3-1/2 hours in an alternating fashion  - leave dressing in place 24-48 more hours and will see what ortho recommends today  Will check back tomorrow for ongoing needs.  Will not follow at this time.  Please re-consult if needed.  Domenic Moras RN BSN [place] Pager [phone]

## 2016-08-04 NOTE — Progress Notes (Signed)
Nutrition Follow-up  DOCUMENTATION CODES:   Obesity unspecified  INTERVENTION:  - Continue to encourage PO intakes of meals. - RD will continue to monitor for additional needs.   NUTRITION DIAGNOSIS:   Increased nutrient needs related to wound healing as evidenced by estimated needs. -ongoing  GOAL:   Patient will meet greater than or equal to 90% of their needs -minimally met on average.   MONITOR:   PO intake, Labs, Weight trends, Skin, I & O's  ASSESSMENT:   46 y.o. male with medical history significant of hypertension, hyperlipidemia, diabetes mellitus, obesity, CKd-II, who presents with right foot wound infection and drainage.  7/16 Per chart review, pt mainly consuming 100% of meals since previous meals although he has skipped a few meals over the past week d/t lack of appetite. Skipped meal is typically at dinner; this is consistent with eating pattern PTA. No new weight since admission. Pt is POD #4 amputation of R great toe with wound vac in place. Per Dr. Doyle Askew' note this AM, Hg consistently at 8-9 without evidence of active bleeding.  Medications reviewed; sliding scale Novolog, daily multivitamin with minerals, 5000 mcg oral vitamin B12/day.  Labs reviewed; CBGs: 148 and 135 mg/dL today, creatinine: 1.35 mg/dL.   7/10 - Pt states he ate 100% of his breakfast of an omelet. - He has a good appetite but does not eat as consistently as he should d/t his new job. - Pt will eat breakfast and lunch but usually won't eat a dinner meal, more like snacks.  - Encouraged small, consistent meals to help with blood sugar control.  - Encouraged protein consumption with DM foot ulcer.  - Pt states he has tried protein supplements before and has options he prefers at home.  - Does not like Premier Protein or Glucerna which are our options here.  - Per chart review, pt has lost 54 lbs since 11/22/15, pt states this is intentional as he was participating in a weight loss program with  his wife.  - Nutrition focused physical exam shows no sign of depletion of muscle mass or body fat.   Diet Order:  Diet Carb Modified Fluid consistency: Thin; Room service appropriate? Yes  Skin:  Wound (see comment) (DM foot ulcer), R great toe incision  Last BM:  7/15  Height:   Ht Readings from Last 1 Encounters:  07/28/16 6' (1.829 m)    Weight:   Wt Readings from Last 1 Encounters:  07/28/16 262 lb (118.8 kg)    Ideal Body Weight:  80.9 kg  BMI:  Body mass index is 35.53 kg/m.  Estimated Nutritional Needs:   Kcal:  4098-1191  Protein:  105-115g  Fluid:  2.3L/day  EDUCATION NEEDS:   Education needs addressed     Jarome Matin, MS, RD, LDN, CNSC Inpatient Clinical Dietitian Pager # [phone] After hours/weekend pager # [phone]

## 2016-08-04 NOTE — Progress Notes (Signed)
ANTICOAGULATION CONSULT NOTE - Initial Consult  Pharmacy Consult for lovenox Indication: VTE prophylaxis  No Known Allergies  Patient Measurements: Height: 6' (182.9 cm) Weight: 262 lb (118.8 kg) IBW/kg (Calculated) : 77.6  Vital Signs:    Labs:  Recent Labs  08/03/16 0534 08/04/16 0510  HGB 9.0* 8.6*  HCT 27.0* 26.1*  PLT 456* 403*  CREATININE 1.29* 1.35*    Estimated Creatinine Clearance: 91 mL/min (A) (by C-G formula based on SCr of 1.35 mg/dL (H)).   Medical History: Past Medical History:  Diagnosis Date  . CKD (chronic kidney disease), stage II   . Diabetes mellitus without complication ([place])   . Hyperlipidemia   . Hypertension   . Obesity     Medications:  Scheduled:  . aspirin EC  81 mg Oral Daily  . atorvastatin  40 mg Oral q1800  . enoxaparin (LOVENOX) injection  30 mg Subcutaneous BID  . gemfibrozil  600 mg Oral BID AC  . insulin aspart  0-9 Units Subcutaneous TID WC  . multivitamin with minerals  1 tablet Oral BID  . omega-3 acid ethyl esters  1 g Oral Daily  . vitamin B-12  5,000 mcg Oral Daily   Infusions:  . piperacillin-tazobactam (ZOSYN)  IV 3.375 g (08/04/16 0520)  . vancomycin Stopped (08/03/16 2327)    Assessment: 46 yo male s/p right fifth ray amputation and application of wound vac on 7/14. To start Lovenox for DVT ppx per Md orders today. Hgb 8.6 but fairly stable and Plts elevated at 403k. Note patient 118 kg  Goal of Therapy:  Heparin level 0.3-0.6 units/ml Monitor platelets by anticoagulation protocol: Yes   Plan:  1) Lovenox 30mg  SQ q12 2) Monitor SCr and CBC and bleeding  Kara Mead 08/04/2016,10:16 AM

## 2016-08-04 NOTE — Progress Notes (Signed)
Pharmacy Antibiotic Note  [name] is a 46 y.o. male c/o swelling and foul odor of right foot admitted on 07/28/2016 with osteomyelitis.  Pharmacy has been consulted for vancomycin dosing; Zosyn per MD  On 7/12 patient underwent L 5th ray amputation with debridement of "skin, subcutaneous tissue, muscle, and fascia at the site of a complex wound infection" such that source control was not clearly established.  Plan:  Continue Zosyn per MD, dosing appropriate  Continue Vancomycin 1750 mg IV q24h (goal VT=15-20 mg/L)  Daily Scr  F/u cultures/levels  Dr. Sharol Given to see; will determine further treatment plans regarding antibiotics and LOT; based on culture results, would consider narrowing antibiotics to UNASYN, which will cover enterococcus and provide additional Gm neg and anaerobic coverage for possible polymicrobial infection  Height: 6' (182.9 cm) Weight: 262 lb (118.8 kg) IBW/kg (Calculated) : 77.6  Temp (24hrs), Avg:98.7 F (37.1 C), Min:98.7 F (37.1 C), Max:98.7 F (37.1 C)   Recent Labs Lab 07/28/16 2315  07/30/16 0518 07/31/16 0451 08/01/16 0529 08/01/16 2133 08/03/16 0534 08/04/16 0510  WBC  --   --  19.5* 20.9* 21.7*  --  16.7* 15.9*  CREATININE  --   < > 2.07* 1.69* 1.39*  --  1.29* 1.35*  LATICACIDVEN 0.98  --   --   --   --   --   --   --   VANCOTROUGH  --   --   --   --   --  10*  --   --   < > = values in this interval not displayed.  Estimated Creatinine Clearance: 91 mL/min (A) (by C-G formula based on SCr of 1.35 mg/dL (H)).    No Known Allergies  Antimicrobials this admission: 7/10 cefepime >> x1 ED 7/10 zosyn >> 7/10 vancomycin >>   Dose adjustments this admission: ---  Microbiology results: 7/9 BCx: NGTD 7/10 Wound (superficial): moderate E faecalis (S-Amp) with mixed organisms 7/12 surgical Cx R toe: rare GPC in pairs  Thank you for allowing pharmacy to be a part of this patient's care.  Adrian Saran, PharmD, BCPS Pager  [phone] 08/04/2016 12:23 PM

## 2016-08-05 LAB — COMPREHENSIVE METABOLIC PANEL
ALT: 66 U/L — ABNORMAL HIGH (ref 17–63)
AST: 43 U/L — ABNORMAL HIGH (ref 15–41)
Albumin: 2.4 g/dL — ABNORMAL LOW (ref 3.5–5.0)
Alkaline Phosphatase: 265 U/L — ABNORMAL HIGH (ref 38–126)
Anion gap: 9 (ref 5–15)
BUN: 21 mg/dL — ABNORMAL HIGH (ref 6–20)
CO2: 25 mmol/L (ref 22–32)
Calcium: 9.2 mg/dL (ref 8.9–10.3)
Chloride: 103 mmol/L (ref 101–111)
Creatinine, Ser: 1.28 mg/dL — ABNORMAL HIGH (ref 0.61–1.24)
GFR calc Af Amer: >60 mL/min (ref >60)
GFR calc non Af Amer: >60 mL/min (ref >60)
Glucose, Bld: 151 mg/dL — ABNORMAL HIGH (ref 65–99)
Potassium: 4.1 mmol/L (ref 3.5–5.1)
Sodium: 137 mmol/L (ref 135–145)
Total Bilirubin: 0.6 mg/dL (ref 0.3–1.2)
Total Protein: 8.2 g/dL — ABNORMAL HIGH (ref 6.5–8.1)

## 2016-08-05 LAB — CBC
HCT: 28.2 % — ABNORMAL LOW (ref 39.0–52.0)
Hemoglobin: 9.5 g/dL — ABNORMAL LOW (ref 13.0–17.0)
MCH: 25.2 pg — ABNORMAL LOW (ref 26.0–34.0)
MCHC: 33.7 g/dL (ref 30.0–36.0)
MCV: 74.8 fL — ABNORMAL LOW (ref 78.0–100.0)
Platelets: 479 10*3/uL — ABNORMAL HIGH (ref 150–400)
RBC: 3.77 MIL/uL — ABNORMAL LOW (ref 4.22–5.81)
RDW: 13.7 % (ref 11.5–15.5)
WBC: 15.7 10*3/uL — ABNORMAL HIGH (ref 4.0–10.5)

## 2016-08-05 LAB — AEROBIC/ANAEROBIC CULTURE (SURGICAL/DEEP WOUND): Culture: NORMAL

## 2016-08-05 LAB — GLUCOSE, CAPILLARY
Glucose-Capillary: 144 mg/dL — ABNORMAL HIGH (ref 65–99)
Glucose-Capillary: 128 mg/dL — ABNORMAL HIGH (ref 65–99)
Glucose-Capillary: 115 mg/dL — ABNORMAL HIGH (ref 65–99)
Glucose-Capillary: 124 mg/dL — ABNORMAL HIGH (ref 65–99)

## 2016-08-05 LAB — AEROBIC/ANAEROBIC CULTURE W GRAM STAIN (SURGICAL/DEEP WOUND)

## 2016-08-05 NOTE — Progress Notes (Signed)
Subjective: 5 Days Post-Op Procedure(s) (LRB): AMPUTATION RIGHT FIFTH RAY AND APPLICATION OF WOUND VAC (Right) Patient reports pain as moderate.    Objective: Vital signs in last 24 hours: Temp:  [98.6 F (37 C)-99.5 F (37.5 C)] 98.8 F (37.1 C) (07/17 0535) Pulse Rate:  [74-83] 74 (07/17 0535) Resp:  [17-18] 17 (07/17 0535) BP: (115-124)/(73-81) 120/73 (07/17 0535) SpO2:  [99 %-100 %] 100 % (07/17 0535)  Intake/Output from previous day: 07/16 0701 - 07/17 0700 In: 1240 [P.O.:1240] Out: 1750 [Urine:1750] Intake/Output this shift: Total I/O In: -  Out: 700 [Urine:700]   Recent Labs  08/03/16 0534 08/04/16 0510 08/05/16 0624  HGB 9.0* 8.6* 9.5*    Recent Labs  08/04/16 0510 08/05/16 0624  WBC 15.9* 15.7*  RBC 3.49* 3.77*  HCT 26.1* 28.2*  PLT 403* 479*    Recent Labs  08/04/16 0510 08/05/16 0624  NA 137 137  K 4.5 4.1  CL 102 103  CO2 26 25  BUN 18 21*  CREATININE 1.35* 1.28*  GLUCOSE 151* 151*  CALCIUM 8.9 9.2   No results for input(s): LABPT, INR in the last 72 hours.  Neurologically intact ABD soft Compartment soft wound open and skin over dorsal aspect is tenuous  Assessment/Plan: 5 Days Post-Op Procedure(s) (LRB): AMPUTATION RIGHT FIFTH RAY AND APPLICATION OF WOUND VAC (Right) Advance diet  Will reapply vac and plan surgery Friday.  Will need 4th ray amp and possibly transmet if skin on dorsal aspect fails.  Breauna Mazzeo L 08/05/2016, 12:42 PM

## 2016-08-05 NOTE — Consult Note (Signed)
   [facility] Inpatient Consult   08/05/2016  [name] 05/24/1970 [id number]    [facility] Care Management/ Link to Wellness follow up for [place] employees/dependents with [facility] insurance. [name] is active with the Link to Wellness program for DM management.   Spoke with [name] at bedside today. He confirms that his Primary insurance is [employer] and secondary is [employer]. His wife is a Furniture conservator/restorer. [name] also reports that he is supposed to have another surgery on Friday.   Support and encouragement given. Provided contact information.   Spoke with inpatient RNCM about the above.   Will update Link to Wellness [employer] about bedside conversation.  Will continue to follow.    Marthenia Rolling, MSN-Ed, RN,BSN [facility] Liaison [phone]

## 2016-08-05 NOTE — Progress Notes (Addendum)
Patient ID: [name], male   DOB: 01/29/1970, 46 y.o.   MRN: [id number]    PROGRESS NOTE   [name]  NTI:[id number] DOB: May 08, 1970 DOA: 07/28/2016  PCP: Heywood Bene, PA-C   Brief Narrative:  Pt is 46 yo male with known DM type II and CKD stage II, presented with right foot infection. XARY on admission worrisome for fifth right foot toe metatarsal osteomyelitis. Patient started on Vanc and Zosyn and TRH asked to admit for further evaluation.    Assessment & Plan:  Right foot osteomyelitis, leukocytosis  would culture positive for Enterococcus Faecalis, sensitive to Vancomycin  - s/p right fifth ray amputation, application of wound vac, post op day #5 - recommended bed to chair ambulation with little weight towards heel only - provide analgesia as needed  - keep on Vancomycin and Zosyn for now until ortho provides further rec's - spoke with Dr. 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 Iron deficiency, microcytic - Hg improving, no evidence of active bleeding  - check CBC in AM  Obesity  - Body mass index is 35.53 kg/m.  DVT  prophylaxis: Lovenox SQ Code Status: Full  Family Communication: Patient at bedside  Disposition Plan: awaiting for ortho rec's, keep on IV Vanc for now, if WBC better in AM, can try to change to oral Augmentin per Dr. Linus Salmons and if ortho team agrees   Consultants:   Ortho  Procedures:   7/12 - Dr. Berenice Primas performed right fifth ray amputation, debridement of ths skin, subQ tissue, muscle, fasca at the site of complex wound infection   Antimicrobials:   Vancomycin 7/10 -->  Zosyn 7//10 --> 7/17  Subjective: Pt reports feeling better.   Objective: Vitals:   08/03/16 2100 08/04/16 1309 08/04/16 2234 08/05/16 0535  BP: 115/69 124/81 115/81 120/73  Pulse: 77 78 83 74  Resp: 18 17 18 17   Temp: 98.7 F (37.1 C) 99.5 F (37.5 C) 98.6 F (37 C) 98.8 F (37.1 C)  TempSrc: Oral Oral Oral Oral  SpO2: 100% 99% 100% 100%  Weight:      Height:        Intake/Output Summary (Last 24 hours) at 08/05/16 0756 Last data filed at 08/05/16 0738  Gross per 24 hour  Intake             1240 ml  Output             2450 ml  Net            -1210  ml   Filed Weights   07/28/16 1831  Weight: 118.8 kg (262 lb)    Physical Exam  Constitutional: Appears well-developed and well-nourished. No distress.  CVS: RRR, S1/S2 +, no murmurs, no gallops, no carotid bruit.  Pulmonary: Effort and breath sounds normal, no stridor, rhonchi, wheezes, rales.  Abdominal: Soft. BS +,  no distension, tenderness, rebound or guarding.  Skin: Skin is warm and dry. Wound vac on right lat vac  Data Reviewed: I have personally reviewed following labs and imaging studies  CBC:  Recent Labs Lab 07/30/16 0518 07/31/16 0451 08/01/16 0529 08/03/16 0534 08/04/16 0510 08/05/16 0624  WBC 19.5* 20.9* 21.7* 16.7* 15.9* 15.7*  NEUTROABS 15.8* 16.9* 17.4* 12.6* 11.6*  --   HGB 8.8* 8.6* 8.3* 9.0* 8.6* 9.5*  HCT 26.3* 25.6* 24.4* 27.0* 26.1* 28.2*  MCV 74.7* 74.6* 73.5* 74.4* 74.8* 74.8*  PLT 432* 446* 450* 456* 403*  163*   Basic Metabolic Panel:  Recent Labs Lab 07/30/16 0518 07/31/16 0451 08/01/16 0529 08/03/16 0534 08/04/16 0510  NA 137 136 136 136 137  K 4.1 4.2 4.2 4.7 4.5  CL 106 105 105 103 102  CO2 21* 23 22 24 26   GLUCOSE 96 112* 120* 151* 151*  BUN 36* 32* 26* 21* 18  CREATININE 2.07* 1.69* 1.39* 1.29* 1.35*  CALCIUM 8.8* 8.7* 8.7* 8.9 8.9   CBG:  Recent Labs Lab 08/04/16 0804 08/04/16 1200 08/04/16 1735 08/04/16 2149 08/05/16 0735  GLUCAP 148* 135* 134* 153* 144*   Urine analysis:    Component Value Date/Time   COLORURINE AMBER (A) 07/28/2016 1846   APPEARANCEUR CLOUDY (A) 07/28/2016 1846   LABSPEC 1.021 07/28/2016 1846   PHURINE 5.0 07/28/2016 1846   GLUCOSEU NEGATIVE 07/28/2016 1846   HGBUR NEGATIVE 07/28/2016 1846   BILIRUBINUR NEGATIVE 07/28/2016 1846   KETONESUR NEGATIVE 07/28/2016 1846   PROTEINUR 100 (A) 07/28/2016 1846   UROBILINOGEN 0.2 01/26/2007 1759   NITRITE NEGATIVE 07/28/2016 1846   LEUKOCYTESUR NEGATIVE 07/28/2016 1846   Recent Results (from the past 240 hour(s))  Culture, blood (Routine x 2)     Status: None   Collection Time: 07/28/16 11:06 PM  Result Value Ref Range Status   Specimen Description BLOOD BLOOD RIGHT HAND  Final   Special Requests   Final    BOTTLES DRAWN AEROBIC AND ANAEROBIC Blood Culture results may not be optimal due to an inadequate volume of blood received in culture bottles   Culture   Final    NO GROWTH 5 DAYS Performed at [facility], [place] [address] [place], [place] [address]    Report Status 08/03/2016 FINAL  Final  Wound or Superficial Culture     Status: None   Collection Time: 07/28/16 11:06 PM  Result Value Ref Range Status   Specimen Description FOOT RIGHT  Final   Special Requests NONE  Final   Gram Stain   Final    NO WBC SEEN FEW GRAM POSITIVE COCCI FEW GRAM NEGATIVE RODS    Culture   Final    MODERATE ENTEROCOCCUS FAECALIS WITHIN MIXED ORGANISMS Performed at [facility], [place] [address], [place], [place] [address]    Report Status 08/01/2016 FINAL  Final   Organism ID, Bacteria ENTEROCOCCUS FAECALIS  Final      Susceptibility   Enterococcus faecalis - MIC*    AMPICILLIN <=2 SENSITIVE Sensitive     VANCOMYCIN 1 SENSITIVE Sensitive     GENTAMICIN SYNERGY SENSITIVE Sensitive     *  MODERATE ENTEROCOCCUS FAECALIS  Culture, blood (Routine x 2)     Status: None   Collection Time: 07/28/16 11:45 PM  Result Value Ref Range Status   Specimen Description BLOOD LEFT ANTECUBITAL  Final   Special Requests   Final    BOTTLES DRAWN AEROBIC AND ANAEROBIC Blood Culture adequate volume   Culture   Final    NO GROWTH 5 DAYS Performed at [facility], [address] [place], [place] [address]    Report Status 08/03/2016 FINAL  Final  Surgical PCR screen     Status: None   Collection Time: 07/30/16 10:17 PM  Result Value Ref Range Status   MRSA, PCR NEGATIVE NEGATIVE Final   Staphylococcus aureus NEGATIVE NEGATIVE Final  Aerobic/Anaerobic Culture (surgical/deep wound)     Status: None (Preliminary result)   Collection Time: 07/31/16  1:38 PM  Result Value Ref Range Status   Specimen Description TOE RIGHT  Final   Special Requests NONE  Final   Gram Stain   Final    ABUNDANT WBC PRESENT,BOTH PMN AND MONONUCLEAR RARE GRAM POSITIVE COCCI IN PAIRS    Culture   Final    CULTURE REINCUBATED FOR BETTER GROWTH HOLDING FOR POSSIBLE ANAEROBE Performed at [facility], [place] [address], [place], [place] [address]    Report Status PENDING  Incomplete    Radiology Studies: No results found.  Scheduled Meds: . aspirin EC  81 mg Oral Daily  . atorvastatin  40 mg Oral q1800  . enoxaparin (LOVENOX) injection  30 mg Subcutaneous BID  . gemfibrozil  600 mg Oral BID AC  . insulin aspart  0-9 Units Subcutaneous TID WC  . multivitamin with minerals  1 tablet Oral BID  . omega-3 acid ethyl esters  1 g Oral Daily  . vitamin B-12  5,000 mcg Oral Daily   Continuous  Infusions: . piperacillin-tazobactam (ZOSYN)  IV 3.375 g (08/05/16 0540)  . vancomycin 1,750 mg (08/04/16 2152)     LOS: 7 days   Time spent: 25 minutes   Faye Ramsay, MD [facility] Pager [phone]  If 7PM-7AM, please contact night-coverage [email or web address] Password [facility] 08/05/2016, 7:56 AM

## 2016-08-06 ENCOUNTER — Encounter (HOSPITAL_BASED_OUTPATIENT_CLINIC_OR_DEPARTMENT_OTHER): Payer: BLUE CROSS/BLUE SHIELD

## 2016-08-06 LAB — CBC
HCT: 29.4 % — ABNORMAL LOW (ref 39.0–52.0)
Hemoglobin: 9.5 g/dL — ABNORMAL LOW (ref 13.0–17.0)
MCH: 24.8 pg — ABNORMAL LOW (ref 26.0–34.0)
MCHC: 32.3 g/dL (ref 30.0–36.0)
MCV: 76.8 fL — ABNORMAL LOW (ref 78.0–100.0)
Platelets: 469 10*3/uL — ABNORMAL HIGH (ref 150–400)
RBC: 3.83 MIL/uL — ABNORMAL LOW (ref 4.22–5.81)
RDW: 14.2 % (ref 11.5–15.5)
WBC: 13.4 10*3/uL — ABNORMAL HIGH (ref 4.0–10.5)

## 2016-08-06 LAB — COMPREHENSIVE METABOLIC PANEL
ALT: 55 U/L (ref 17–63)
AST: 28 U/L (ref 15–41)
Albumin: 2.4 g/dL — ABNORMAL LOW (ref 3.5–5.0)
Alkaline Phosphatase: 239 U/L — ABNORMAL HIGH (ref 38–126)
Anion gap: 10 (ref 5–15)
BUN: 20 mg/dL (ref 6–20)
CO2: 25 mmol/L (ref 22–32)
Calcium: 9.2 mg/dL (ref 8.9–10.3)
Chloride: 103 mmol/L (ref 101–111)
Creatinine, Ser: 1.23 mg/dL (ref 0.61–1.24)
GFR calc Af Amer: >60 mL/min (ref >60)
GFR calc non Af Amer: >60 mL/min (ref >60)
Glucose, Bld: 146 mg/dL — ABNORMAL HIGH (ref 65–99)
Potassium: 4 mmol/L (ref 3.5–5.1)
Sodium: 138 mmol/L (ref 135–145)
Total Bilirubin: 0.6 mg/dL (ref 0.3–1.2)
Total Protein: 8 g/dL (ref 6.5–8.1)

## 2016-08-06 LAB — GLUCOSE, CAPILLARY
Glucose-Capillary: 139 mg/dL — ABNORMAL HIGH (ref 65–99)
Glucose-Capillary: 129 mg/dL — ABNORMAL HIGH (ref 65–99)
Glucose-Capillary: 115 mg/dL — ABNORMAL HIGH (ref 65–99)
Glucose-Capillary: 145 mg/dL — ABNORMAL HIGH (ref 65–99)

## 2016-08-06 MED ORDER — SODIUM CHLORIDE 0.9 % IV SOLN
3.0000 g | Freq: Four times a day (QID) | INTRAVENOUS | Status: DC
  Administered 2016-08-06 – 2016-08-13 (×27): 3 g via INTRAVENOUS
  Filled 2016-08-06 (×30): qty 3

## 2016-08-06 NOTE — Progress Notes (Signed)
Pharmacy Antibiotic Note  [name] is a 46 y.o. male c/o swelling and foul odor of right foot admitted on 07/28/2016 with osteomyelitis.  He has been on Vancomycin & Zosyn-->pharmacy now consulted to change to unasyn based on cx data.   On 7/12 patient underwent L 5th ray amputation with debridement of "skin, subcutaneous tissue, muscle, and fascia at the site of a complex wound infection" such that source control was not clearly established. Plan to return to OR on 7/20 for 4th toe amputation.    Plan:  Unasyn 3gm IV q6h  No further dose adjustments anticipated-pharmacy to sign off.  Please re-consult if needed.   Transition to Augmentin at discharge if needed.   Height: 6' (182.9 cm) Weight: 262 lb (118.8 kg) IBW/kg (Calculated) : 77.6  Temp (24hrs), Avg:98.6 F (37 C), Min:98.3 F (36.8 C), Max:98.8 F (37.1 C)   Recent Labs Lab 08/01/16 0529 08/01/16 2133 08/03/16 0534 08/04/16 0510 08/05/16 0624 08/06/16 0535  WBC 21.7*  --  16.7* 15.9* 15.7* 13.4*  CREATININE 1.39*  --  1.29* 1.35* 1.28* 1.23  VANCOTROUGH  --  10*  --   --   --   --     Estimated Creatinine Clearance: 99.9 mL/min (by C-G formula based on SCr of 1.23 mg/dL).    No Known Allergies  Antimicrobials this admission: 7/10 cefepime >> x1 ED 7/10 zosyn >>7/17 7/10 vancomycin >> 7/18  Dose adjustments this admission: ---  Microbiology results: 7/9 BCx: NGTD 7/10 Wound (superficial): moderate E faecalis (S-Amp) with mixed organisms 7/12 surgical Cx R toe: few Prevotella bivia  Thank you for allowing pharmacy to be a part of this patient's care.  Netta Cedars, PharmD, BCPS Pager: [phone] 08/06/2016 12:49 PM

## 2016-08-06 NOTE — Evaluation (Signed)
Physical Therapy Evaluation Patient Details Name: [name] MRN: [id number] DOB: [birth year] Today's Date: 08/06/2016   History of Present Illness  Pt s/p amputations of fifth ray R foot.  Pt with hx of DM and CKD  Clinical Impression  Pt admitted as above and presenting with functional mobility limitations 2* post op pain and WB restrictions to R heel only.  Pt should progress well to dc home with family assist.    Follow Up Recommendations No PT follow up    Equipment Recommendations  None recommended by PT    Recommendations for Other Services       Precautions / Restrictions Precautions Precautions: Fall Restrictions Weight Bearing Restrictions: Yes RLE Weight Bearing: Weight bearing as tolerated Other Position/Activity Restrictions: on heel only      Mobility  Bed Mobility Overal bed mobility: Modified Independent             General bed mobility comments: Pt unassisted to/from bed  Transfers Overall transfer level: Needs assistance Equipment used: Rolling walker (2 wheeled) Transfers: Sit to/from Stand Sit to Stand: Min guard         General transfer comment: cues for use of UEs to self assist  Ambulation/Gait Ambulation/Gait assistance: Min guard;Supervision Ambulation Distance (Feet): 120 Feet Assistive device: Rolling walker (2 wheeled) Gait Pattern/deviations: Step-to pattern;Shuffle Gait velocity: decr Gait velocity interpretation: Below normal speed for age/gender General Gait Details: cues for posture and position from RW.  Pt with good awareness of WB on R heel only  Stairs            Wheelchair Mobility    Modified Rankin (Stroke Patients Only)       Balance Overall balance assessment: Needs assistance Sitting-balance support: No upper extremity supported;Feet supported Sitting balance-Leahy Scale: Good     Standing balance support: No upper extremity supported Standing balance-Leahy Scale: Good                                Pertinent Vitals/Pain Pain Assessment: 0-10 Pain Score: 1  Pain Location: R foot Pain Intervention(s): Limited activity within patient's tolerance;Monitored during session    Home Living Family/patient expects to be discharged to:: Private residence Living Arrangements: Spouse/significant other;Children Available Help at Discharge: Family Type of Home: House Home Access: Stairs to enter Entrance Stairs-Rails: None Technical brewer of Steps: 3 Home Layout: One level Home Equipment: Environmental consultant - 2 wheels;Crutches;Cane - single point      Prior Function Level of Independence: Independent               Hand Dominance        Extremity/Trunk Assessment   Upper Extremity Assessment Upper Extremity Assessment: Overall WFL for tasks assessed    Lower Extremity Assessment Lower Extremity Assessment: RLE deficits/detail RLE Deficits / Details: Strength WFL with exception of foot - dressings and wound vac in place    Cervical / Trunk Assessment Cervical / Trunk Assessment: Normal  Communication   Communication: No difficulties  Cognition Arousal/Alertness: Awake/alert Behavior During Therapy: WFL for tasks assessed/performed Overall Cognitive Status: Within Functional Limits for tasks assessed                                        General Comments      Exercises     Assessment/Plan    PT Assessment Patient  needs continued PT services  PT Problem List Decreased activity tolerance;Decreased knowledge of use of DME;Decreased knowledge of precautions;Obesity;Pain;Decreased mobility       PT Treatment Interventions DME instruction;Gait training;Stair training;Functional mobility training;Therapeutic activities;Therapeutic exercise;Patient/family education    PT Goals (Current goals can be found in the Care Plan section)  Acute Rehab PT Goals Patient Stated Goal: Regain INd PT Goal Formulation: With patient Time For Goal  Achievement: 08/16/16 Potential to Achieve Goals: Good    Frequency Min 4X/week   Barriers to discharge        Co-evaluation               AM-PAC PT "6 Clicks" Daily Activity  Outcome Measure Difficulty turning over in bed (including adjusting bedclothes, sheets and blankets)?: A Little Difficulty moving from lying on back to sitting on the side of the bed? : A Little Difficulty sitting down on and standing up from a chair with arms (e.g., wheelchair, bedside commode, etc,.)?: A Little Help needed moving to and from a bed to chair (including a wheelchair)?: A Little Help needed walking in hospital room?: A Little Help needed climbing 3-5 steps with a railing? : A Little 6 Click Score: 18    End of Session   Activity Tolerance: Patient tolerated treatment well Patient left: in bed;with call bell/phone within reach;with family/visitor present Nurse Communication: Mobility status PT Visit Diagnosis: Unsteadiness on feet (R26.81)    Time: 4270-6237 PT Time Calculation (min) (ACUTE ONLY): 28 min   Charges:   PT Evaluation $PT Eval Low Complexity: 1 Procedure     PT G Codes:        Pg [phone]   Shaunak Kreis 08/06/2016, 11:44 AM

## 2016-08-06 NOTE — Progress Notes (Signed)
ANTICOAGULATION CONSULT NOTE  Pharmacy Consult for lovenox Indication: VTE prophylaxis  No Known Allergies  Patient Measurements: Height: 6' (182.9 cm) Weight: 262 lb (118.8 kg) IBW/kg (Calculated) : 77.6  Vital Signs: Temp: 98.3 F (36.8 C) (07/18 0450) Temp Source: Oral (07/18 0450) BP: 133/75 (07/18 0450) Pulse Rate: 72 (07/18 0450)  Labs:  Recent Labs  08/04/16 0510 08/05/16 0624 08/06/16 0535  HGB 8.6* 9.5* 9.5*  HCT 26.1* 28.2* 29.4*  PLT 403* 479* 469*  CREATININE 1.35* 1.28* 1.23    Estimated Creatinine Clearance: 99.9 mL/min (by C-G formula based on SCr of 1.23 mg/dL).   Medical History: Past Medical History:  Diagnosis Date  . CKD (chronic kidney disease), stage II   . Diabetes mellitus without complication ([place])   . Hyperlipidemia   . Hypertension   . Obesity     Medications:  Scheduled:  . aspirin EC  81 mg Oral Daily  . atorvastatin  40 mg Oral q1800  . enoxaparin (LOVENOX) injection  30 mg Subcutaneous BID  . gemfibrozil  600 mg Oral BID AC  . insulin aspart  0-9 Units Subcutaneous TID WC  . multivitamin with minerals  1 tablet Oral BID  . omega-3 acid ethyl esters  1 g Oral Daily  . vitamin B-12  5,000 mcg Oral Daily   Infusions:  . ampicillin-sulbactam (UNASYN) IV      Assessment: 46 yo male s/p right fifth ray amputation and application of wound vac on 7/14. To start Lovenox for DVT ppx per Md orders today. Hgb 8.6 but fairly stable and Plts elevated at 403k. Note patient 118 kg  CBC has been stable on this regimen.  Good renal function.   Goal of Therapy:  Heparin level 0.3-0.6 units/ml Monitor platelets by anticoagulation protocol: Yes   Plan:  1) Lovenox 30mg  SQ q12 2) Monitor SCr and CBC and bleeding 3) No further dose adjustments anticipated.  Pharmacy to sign off.  Please re-consult if needed.   Biagio Borg 08/06/2016,12:54 PM

## 2016-08-06 NOTE — Progress Notes (Addendum)
Patient ID: [name], male   DOB: [birth year], 46 y.o.   MRN: [id number]    PROGRESS NOTE   [name]  QIH:[id number] DOB: 13-Sep-1970 DOA: 07/28/2016  PCP: Heywood Bene, PA-C   Brief Narrative:  Pt is 46 yo male with known DM type II and CKD stage II, presented with right foot infection. XARY on admission worrisome for fifth right foot toe metatarsal osteomyelitis. Patient started on Vanc and Zosyn and TRH asked to admit for further evaluation.    Assessment & Plan:  Right foot osteomyelitis, leukocytosis  would culture positive for Enterococcus Faecalis, sensitive to Vancomycin  - s/p right fifth ray amputation, application of wound vac, post op day #6 - recommended bed to chair ambulation with little weight towards heel only - provide analgesia as needed  - has been on Vanc and Zosyn, based on sensitivity report, will change to Unasyn IV until surgery on Friday  - WBc overall trending down  - VAC in place, lateral right foot wound area, no bleeding noted on exam  - plan to irrigate with 30 mL of saline for 10 minutes and suction x3-1/2 hours in an alternating fashion  - per ortho, plan to take pt to OR on Friday for 4th toe amputation and possibly transmet if skin on dorsal aspect fails   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 Iron deficiency, microcytic - Hg overall stable   Obesity  - Body mass index is 35.53 kg/m.  DVT prophylaxis: Lovenox SQ Code Status: Full  Family Communication: Patient at bedside  Disposition Plan: plan for ortho to take pt to OR Friday   Consultants:   Ortho  Procedures:     7/12 - Dr. Berenice Primas performed right fifth ray amputation, debridement of ths skin, subQ tissue, muscle, fasca at the site of complex wound infection   Antimicrobials:   Vancomycin 7/10 --> 7/18  Zosyn 7//10 --> 7/17  Unasyn 7/18 -->  Subjective: Pt denies pain this AM.   Objective: Vitals:   08/05/16 0535 08/05/16 1408 08/05/16 2041 08/06/16 0450  BP: 120/73 130/79 126/75 133/75  Pulse: 74 71 79 72  Resp: 17 16 17 18   Temp: 98.8 F (37.1 C) 98.7 F (37.1 C) 98.8 F (37.1 C) 98.3 F (36.8 C)  TempSrc: Oral Oral Oral Oral  SpO2: 100% 96% 100% 100%  Weight:      Height:        Intake/Output Summary (Last 24 hours) at 08/06/16 1232 Last data filed at 08/06/16 1055  Gross per 24 hour  Intake              740 ml  Output             2300 ml  Net            -1560 ml   Filed Weights   07/28/16 1831  Weight: 118.8 kg (262 lb)    Physical Exam  Constitutional: Appears well-developed and well-nourished. No distress.  CVS: RRR, S1/S2 +, no murmurs, no gallops, no carotid bruit.  Pulmonary: Effort  and breath sounds normal, no stridor, rhonchi, wheezes, rales.  Abdominal: Soft. BS +,  no distension, tenderness, rebound or guarding.  Musculoskeletal: Normal range of motion. Wound vac on right foot in place  Psychiatric: Normal mood and affect. Behavior, judgment, thought content normal.   Data Reviewed: I have personally reviewed following labs and imaging studies  CBC:  Recent Labs Lab 07/31/16 0451 08/01/16 0529 08/03/16 0534 08/04/16 0510 08/05/16 0624 08/06/16 0535  WBC 20.9* 21.7* 16.7* 15.9* 15.7* 13.4*  NEUTROABS 16.9* 17.4* 12.6* 11.6*  --   --   HGB 8.6* 8.3* 9.0* 8.6* 9.5* 9.5*  HCT 25.6* 24.4* 27.0* 26.1* 28.2* 29.4*  MCV 74.6* 73.5* 74.4* 74.8* 74.8* 76.8*  PLT 446* 450* 456* 403* 479* 846*   Basic Metabolic Panel:  Recent Labs Lab 08/01/16 0529 08/03/16 0534 08/04/16 0510 08/05/16 0624 08/06/16 0535  NA 136 136 137 137 138  K 4.2 4.7 4.5  4.1 4.0  CL 105 103 102 103 103  CO2 22 24 26 25 25   GLUCOSE 120* 151* 151* 151* 146*  BUN 26* 21* 18 21* 20  CREATININE 1.39* 1.29* 1.35* 1.28* 1.23  CALCIUM 8.7* 8.9 8.9 9.2 9.2   CBG:  Recent Labs Lab 08/05/16 1326 08/05/16 1810 08/05/16 2039 08/06/16 0721 08/06/16 1153  GLUCAP 128* 115* 124* 139* 129*   Urine analysis:    Component Value Date/Time   COLORURINE AMBER (A) 07/28/2016 1846   APPEARANCEUR CLOUDY (A) 07/28/2016 1846   LABSPEC 1.021 07/28/2016 1846   PHURINE 5.0 07/28/2016 1846   GLUCOSEU NEGATIVE 07/28/2016 1846   HGBUR NEGATIVE 07/28/2016 1846   BILIRUBINUR NEGATIVE 07/28/2016 1846   KETONESUR NEGATIVE 07/28/2016 1846   PROTEINUR 100 (A) 07/28/2016 1846   UROBILINOGEN 0.2 01/26/2007 1759   NITRITE NEGATIVE 07/28/2016 1846   LEUKOCYTESUR NEGATIVE 07/28/2016 1846   Recent Results (from the past 240 hour(s))  Culture, blood (Routine x 2)     Status: None   Collection Time: 07/28/16 11:06 PM  Result Value Ref Range Status   Specimen Description BLOOD BLOOD RIGHT HAND  Final   Special Requests   Final    BOTTLES DRAWN AEROBIC AND ANAEROBIC Blood Culture results may not be optimal due to an inadequate volume of blood received in culture bottles   Culture   Final    NO GROWTH 5 DAYS Performed at [facility], [address], [place], [place] [address]    Report Status 08/03/2016 FINAL  Final  Wound or Superficial Culture     Status: None   Collection Time: 07/28/16 11:06 PM  Result Value Ref Range Status   Specimen Description FOOT RIGHT  Final   Special Requests NONE  Final   Gram Stain   Final    NO WBC SEEN FEW GRAM POSITIVE COCCI FEW GRAM NEGATIVE RODS    Culture   Final    MODERATE ENTEROCOCCUS FAECALIS WITHIN MIXED ORGANISMS Performed at [facility], [place] [address] [place], [place] [address]    Report Status 08/01/2016 FINAL  Final   Organism ID, Bacteria ENTEROCOCCUS FAECALIS  Final      Susceptibility   Enterococcus  faecalis - MIC*    AMPICILLIN <=2 SENSITIVE Sensitive     VANCOMYCIN 1 SENSITIVE Sensitive     GENTAMICIN SYNERGY SENSITIVE Sensitive     * MODERATE ENTEROCOCCUS FAECALIS  Culture, blood (Routine x 2)     Status: None   Collection Time: 07/28/16 11:45 PM  Result Value Ref Range  Status   Specimen Description BLOOD LEFT ANTECUBITAL  Final   Special Requests   Final    BOTTLES DRAWN AEROBIC AND ANAEROBIC Blood Culture adequate volume   Culture   Final    NO GROWTH 5 DAYS Performed at [facility], [address] [place], [place] [address]    Report Status 08/03/2016 FINAL  Final  Surgical PCR screen     Status: None   Collection Time: 07/30/16 10:17 PM  Result Value Ref Range Status   MRSA, PCR NEGATIVE NEGATIVE Final   Staphylococcus aureus NEGATIVE NEGATIVE Final  Aerobic/Anaerobic Culture (surgical/deep wound)     Status: None (Preliminary result)   Collection Time: 07/31/16  1:38 PM  Result Value Ref Range Status   Specimen Description TOE RIGHT  Final   Special Requests NONE  Final   Gram Stain   Final    ABUNDANT WBC PRESENT,BOTH PMN AND MONONUCLEAR RARE GRAM POSITIVE COCCI IN PAIRS    Culture   Final    CULTURE REINCUBATED FOR BETTER GROWTH HOLDING FOR POSSIBLE ANAEROBE Performed at [facility], [place] [address], [place],  [address]    Report Status PENDING  Incomplete    Radiology Studies: No results found.  Scheduled Meds: . aspirin EC  81 mg Oral Daily  . atorvastatin  40 mg Oral q1800  . enoxaparin (LOVENOX) injection  30 mg Subcutaneous BID  . gemfibrozil  600 mg Oral BID AC  . insulin aspart  0-9 Units Subcutaneous TID WC  . multivitamin with minerals  1 tablet Oral BID  . omega-3 acid ethyl esters  1 g Oral Daily  . vitamin B-12  5,000 mcg Oral Daily   Continuous Infusions: . vancomycin Stopped (08/05/16 2358)     LOS: 8 days   Time spent: 25 minutes   Faye Ramsay, MD [facility] Pager [phone]  If 7PM-7AM,  please contact night-coverage [email or web address] Password [facility] 08/06/2016, 12:32 PM

## 2016-08-06 NOTE — Progress Notes (Signed)
We will leave the [facility] in place with the irrigation setting. We will see the patient tomorrow and get him set for surgery on Friday to include fourth metatarsal resection and hopeful wound closure. If the dorsal skin is broken down completely may ultimately need a transmetatarsal amputation.

## 2016-08-07 ENCOUNTER — Other Ambulatory Visit: Payer: Self-pay | Admitting: Orthopedic Surgery

## 2016-08-07 LAB — BASIC METABOLIC PANEL
Anion gap: 6 (ref 5–15)
BUN: 20 mg/dL (ref 6–20)
CO2: 26 mmol/L (ref 22–32)
Calcium: 9.1 mg/dL (ref 8.9–10.3)
Chloride: 105 mmol/L (ref 101–111)
Creatinine, Ser: 1.25 mg/dL — ABNORMAL HIGH (ref 0.61–1.24)
GFR calc Af Amer: >60 mL/min (ref >60)
GFR calc non Af Amer: >60 mL/min (ref >60)
Glucose, Bld: 136 mg/dL — ABNORMAL HIGH (ref 65–99)
Potassium: 4.5 mmol/L (ref 3.5–5.1)
Sodium: 137 mmol/L (ref 135–145)

## 2016-08-07 LAB — CBC
HCT: 27.5 % — ABNORMAL LOW (ref 39.0–52.0)
Hemoglobin: 8.9 g/dL — ABNORMAL LOW (ref 13.0–17.0)
MCH: 25.1 pg — ABNORMAL LOW (ref 26.0–34.0)
MCHC: 32.4 g/dL (ref 30.0–36.0)
MCV: 77.5 fL — ABNORMAL LOW (ref 78.0–100.0)
Platelets: 451 10*3/uL — ABNORMAL HIGH (ref 150–400)
RBC: 3.55 MIL/uL — ABNORMAL LOW (ref 4.22–5.81)
RDW: 14 % (ref 11.5–15.5)
WBC: 12.5 10*3/uL — ABNORMAL HIGH (ref 4.0–10.5)

## 2016-08-07 LAB — GLUCOSE, CAPILLARY
Glucose-Capillary: 125 mg/dL — ABNORMAL HIGH (ref 65–99)
Glucose-Capillary: 126 mg/dL — ABNORMAL HIGH (ref 65–99)
Glucose-Capillary: 121 mg/dL — ABNORMAL HIGH (ref 65–99)
Glucose-Capillary: 117 mg/dL — ABNORMAL HIGH (ref 65–99)
Glucose-Capillary: 139 mg/dL — ABNORMAL HIGH (ref 65–99)

## 2016-08-07 NOTE — Progress Notes (Signed)
PT Cancellation Note  Patient Details Name: [name] MRN: [id number] DOB: 10-15-1970   Cancelled Treatment:     per chart review Osteomyelitis is "worsening" with plans for more surgery tomorrow.    Rica Koyanagi  PTA [facility]  Acute  Rehab Pager      [phone]

## 2016-08-07 NOTE — Progress Notes (Addendum)
Patient ID: [name], male   DOB: October 14, 1970, 46 y.o.   MRN: [id number]    PROGRESS NOTE   [name]  KCM:[id number] DOB: [birth year] DOA: 07/28/2016  PCP: Heywood Bene, PA-C   Brief Narrative:  Pt is 46 yo male with known DM type II and CKD stage II, presented with right foot infection. XARY on admission worrisome for fifth right foot toe metatarsal osteomyelitis. Patient started on Vanc and Zosyn and TRH asked to admit for further evaluation.    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 Iron deficiency, microcytic - Hg slightly down overnight, no evidence of active bleeding - CBC in AM  Obesity  - Body mass index is 35.53 kg/m.  DVT prophylaxis: Lovenox SQ Code Status: Full  Family Communication: pt at bedside  Disposition Plan: plan to take to OR in  AM  Consultants:   Ortho  Procedures:   7/12 - Dr. Berenice Primas performed right fifth ray amputation, debridement of ths skin, subQ tissue, muscle, fasca at the site of complex wound infection   Antimicrobials:   Vancomycin 7/10 --> 7/18  Zosyn 7//10 --> 7/17  Unasyn 7/18 -->  Subjective: Pt denies any concerns this AM.  Objective: Vitals:   08/06/16 0450 08/06/16 1409 08/06/16 2131 08/07/16 0535  BP: 133/75 137/81 133/81 132/86  Pulse: 72 83 76 70  Resp: 18 16 15 16   Temp: 98.3 F (36.8 C) 98.5 F (36.9 C) 98.4 F (36.9 C) 98.2 F (36.8 C)  TempSrc: Oral Oral Oral Oral  SpO2: 100% 100% 100% 100%  Weight:      Height:        Intake/Output Summary (Last 24 hours) at 08/07/16 1112 Last data filed at 08/07/16 0812  Gross per 24 hour  Intake              930 ml  Output             1050 ml  Net             -120 ml   Filed Weights   07/28/16 1831  Weight: 118.8 kg (262 lb)    Physical Exam  Constitutional: Appears well-developed and well-nourished. No distress.  CVS: RRR, S1/S2 +, no murmurs,  no gallops, no carotid bruit.  Pulmonary: Effort and breath sounds normal, no stridor, rhonchi, wheezes, rales.  Abdominal: Soft. BS +,  no distension, tenderness, rebound or guarding.  Musculoskeletal: Normal range of motion. Wound vac on the right foot Lymphadenopathy: No lymphadenopathy noted, cervical, inguinal. Neuro: Alert. Normal reflexes, muscle tone coordination. No cranial nerve deficit. Psychiatric: Normal mood and affect. Behavior, judgment, thought content normal.   Data Reviewed: I have personally reviewed following labs and imaging studies  CBC:  Recent Labs Lab 08/01/16 0529 08/03/16 0534 08/04/16 0510 08/05/16 0624 08/06/16 0535 08/07/16 0518  WBC 21.7* 16.7* 15.9* 15.7* 13.4* 12.5*  NEUTROABS 17.4* 12.6* 11.6*  --   --   --   HGB 8.3* 9.0* 8.6* 9.5* 9.5* 8.9*  HCT 24.4* 27.0* 26.1* 28.2* 29.4* 27.5*  MCV 73.5* 74.4* 74.8* 74.8* 76.8* 77.5*  PLT  450* 456* 403* 479* 469* 810*   Basic Metabolic Panel:  Recent Labs Lab 08/03/16 0534 08/04/16 0510 08/05/16 0624 08/06/16 0535 08/07/16 0518  NA 136 137 137 138 137  K 4.7 4.5 4.1 4.0 4.5  CL 103 102 103 103 105  CO2 24 26 25 25 26   GLUCOSE 151* 151* 151* 146* 136*  BUN 21* 18 21* 20 20  CREATININE 1.29* 1.35* 1.28* 1.23 1.25*  CALCIUM 8.9 8.9 9.2 9.2 9.1   CBG:  Recent Labs Lab 08/06/16 0721 08/06/16 1153 08/06/16 1640 08/06/16 2127 08/07/16 0751  GLUCAP 139* 129* 115* 145* 125*   Urine analysis:    Component Value Date/Time   COLORURINE AMBER (A) 07/28/2016 1846   APPEARANCEUR CLOUDY (A) 07/28/2016 1846   LABSPEC 1.021 07/28/2016 1846   PHURINE 5.0 07/28/2016 1846   GLUCOSEU NEGATIVE 07/28/2016 1846   HGBUR NEGATIVE 07/28/2016 1846   BILIRUBINUR NEGATIVE 07/28/2016 1846   KETONESUR NEGATIVE 07/28/2016 1846   PROTEINUR 100 (A) 07/28/2016 1846   UROBILINOGEN 0.2 01/26/2007 1759   NITRITE NEGATIVE 07/28/2016 1846   LEUKOCYTESUR NEGATIVE 07/28/2016 1846   Recent Results (from the past 240 hour(s))  Culture, blood (Routine x 2)     Status: None   Collection Time: 07/28/16 11:06 PM  Result Value Ref Range Status   Specimen Description BLOOD BLOOD RIGHT HAND  Final   Special Requests   Final    BOTTLES DRAWN AEROBIC AND ANAEROBIC Blood Culture results may not be optimal due to an inadequate volume of blood received in culture bottles   Culture   Final    NO GROWTH 5 DAYS Performed at [facility], [address] [place], [place] [address]    Report Status 08/03/2016 FINAL  Final  Wound or Superficial Culture     Status: None   Collection Time: 07/28/16 11:06 PM  Result Value Ref Range Status   Specimen Description FOOT RIGHT  Final   Special Requests NONE  Final   Gram Stain   Final    NO WBC SEEN FEW GRAM POSITIVE COCCI FEW GRAM NEGATIVE RODS    Culture   Final    MODERATE ENTEROCOCCUS FAECALIS WITHIN MIXED ORGANISMS Performed at [facility], [place] [address] [place], [place] [address]    Report Status 08/01/2016 FINAL  Final   Organism ID, Bacteria ENTEROCOCCUS FAECALIS  Final      Susceptibility   Enterococcus faecalis - MIC*    AMPICILLIN <=2 SENSITIVE Sensitive     VANCOMYCIN 1 SENSITIVE Sensitive     GENTAMICIN SYNERGY SENSITIVE Sensitive     * MODERATE ENTEROCOCCUS  FAECALIS  Culture, blood (Routine x 2)     Status: None   Collection Time: 07/28/16 11:45 PM  Result Value Ref Range Status   Specimen Description BLOOD LEFT ANTECUBITAL  Final   Special Requests   Final    BOTTLES DRAWN AEROBIC AND ANAEROBIC Blood Culture adequate volume   Culture   Final    NO GROWTH 5 DAYS Performed at [facility], [address], [place], [place] [address]    Report Status 08/03/2016 FINAL  Final  Surgical PCR screen     Status: None   Collection Time: 07/30/16 10:17 PM  Result Value Ref Range Status   MRSA, PCR NEGATIVE NEGATIVE Final   Staphylococcus aureus NEGATIVE NEGATIVE Final  Aerobic/Anaerobic Culture (surgical/deep wound)     Status: None (Preliminary result)   Collection Time: 07/31/16  1:38 PM  Result Value Ref Range Status   Specimen Description TOE RIGHT  Final   Special Requests NONE  Final   Gram Stain   Final    ABUNDANT WBC PRESENT,BOTH PMN AND MONONUCLEAR RARE GRAM POSITIVE COCCI IN PAIRS    Culture   Final    CULTURE REINCUBATED FOR BETTER GROWTH HOLDING FOR POSSIBLE ANAEROBE Performed at [facility], [place] [address] [place], [place] [address]    Report Status PENDING  Incomplete    Radiology Studies: No results found.  Scheduled Meds: . aspirin EC  81 mg Oral Daily  . atorvastatin  40 mg Oral q1800  . enoxaparin (LOVENOX) injection  30 mg Subcutaneous BID  . gemfibrozil  600 mg Oral BID AC  . insulin aspart  0-9 Units Subcutaneous TID WC  . multivitamin with minerals  1 tablet Oral BID  . omega-3 acid ethyl esters  1 g Oral Daily  . vitamin B-12  5,000 mcg Oral Daily    Continuous Infusions: . ampicillin-sulbactam (UNASYN) IV 3 g (08/07/16 0827)     LOS: 9 days   Time spent: 25 minutes   Faye Ramsay, MD [facility] Pager [phone]  If 7PM-7AM, please contact night-coverage [email or web address] Password [email or web address] 08/07/2016, 11:12 AM

## 2016-08-08 ENCOUNTER — Inpatient Hospital Stay (HOSPITAL_COMMUNITY): Payer: BLUE CROSS/BLUE SHIELD | Admitting: Anesthesiology

## 2016-08-08 ENCOUNTER — Encounter (HOSPITAL_COMMUNITY): Payer: Self-pay | Admitting: Orthopedic Surgery

## 2016-08-08 ENCOUNTER — Encounter (HOSPITAL_COMMUNITY)
Admission: EM | Disposition: A | Discharge status: Home Health | Payer: Self-pay | Source: Home / Self Care | Attending: Internal Medicine

## 2016-08-08 HISTORY — PX: AMPUTATION: SHX166

## 2016-08-08 LAB — CBC
HCT: 28.2 % — ABNORMAL LOW (ref 39.0–52.0)
Hemoglobin: 9 g/dL — ABNORMAL LOW (ref 13.0–17.0)
MCH: 24.5 pg — ABNORMAL LOW (ref 26.0–34.0)
MCHC: 31.9 g/dL (ref 30.0–36.0)
MCV: 76.8 fL — ABNORMAL LOW (ref 78.0–100.0)
Platelets: 460 10*3/uL — ABNORMAL HIGH (ref 150–400)
RBC: 3.67 MIL/uL — ABNORMAL LOW (ref 4.22–5.81)
RDW: 14.2 % (ref 11.5–15.5)
WBC: 11.1 10*3/uL — ABNORMAL HIGH (ref 4.0–10.5)

## 2016-08-08 LAB — BASIC METABOLIC PANEL
Anion gap: 8 (ref 5–15)
BUN: 23 mg/dL — ABNORMAL HIGH (ref 6–20)
CO2: 25 mmol/L (ref 22–32)
Calcium: 9.1 mg/dL (ref 8.9–10.3)
Chloride: 104 mmol/L (ref 101–111)
Creatinine, Ser: 1.24 mg/dL (ref 0.61–1.24)
GFR calc Af Amer: >60 mL/min (ref >60)
GFR calc non Af Amer: >60 mL/min (ref >60)
Glucose, Bld: 152 mg/dL — ABNORMAL HIGH (ref 65–99)
Potassium: 4.4 mmol/L (ref 3.5–5.1)
Sodium: 137 mmol/L (ref 135–145)

## 2016-08-08 LAB — GLUCOSE, CAPILLARY
Glucose-Capillary: 146 mg/dL — ABNORMAL HIGH (ref 65–99)
Glucose-Capillary: 130 mg/dL — ABNORMAL HIGH (ref 65–99)
Glucose-Capillary: 132 mg/dL — ABNORMAL HIGH (ref 65–99)
Glucose-Capillary: 143 mg/dL — ABNORMAL HIGH (ref 65–99)
Glucose-Capillary: 128 mg/dL — ABNORMAL HIGH (ref 65–99)

## 2016-08-08 SURGERY — AMPUTATION, FOOT, RAY
Anesthesia: General | Site: Foot | Laterality: Right

## 2016-08-08 MED ORDER — SODIUM CHLORIDE 0.9 % IV SOLN
INTRAVENOUS | Status: DC | PRN
  Administered 2016-08-08: 500 mL

## 2016-08-08 MED ORDER — FENTANYL CITRATE (PF) 100 MCG/2ML IJ SOLN
INTRAMUSCULAR | Status: DC | PRN
  Administered 2016-08-08 (×4): 50 ug via INTRAVENOUS

## 2016-08-08 MED ORDER — VANCOMYCIN HCL IN DEXTROSE 1-5 GM/200ML-% IV SOLN
INTRAVENOUS | Status: AC
  Administered 2016-08-08: 1000 mg via INTRAVENOUS
  Filled 2016-08-08: qty 200

## 2016-08-08 MED ORDER — SODIUM CHLORIDE 0.9 % IV SOLN
INTRAVENOUS | Status: AC
  Filled 2016-08-08: qty 500000

## 2016-08-08 MED ORDER — POVIDONE-IODINE 10 % EX SWAB: 2.0000 "application " | Freq: Once | CUTANEOUS | Status: DC

## 2016-08-08 MED ORDER — CHLORHEXIDINE GLUCONATE 4 % EX LIQD
60.0000 mL | Freq: Once | CUTANEOUS | Status: AC
  Administered 2016-08-08: 4 via TOPICAL

## 2016-08-08 MED ORDER — SODIUM CHLORIDE 0.9 % IR SOLN
Status: DC | PRN
  Administered 2016-08-08: 3000 mL

## 2016-08-08 MED ORDER — LIDOCAINE 2% (20 MG/ML) 5 ML SYRINGE
INTRAMUSCULAR | Status: AC
  Filled 2016-08-08: qty 5

## 2016-08-08 MED ORDER — FENTANYL CITRATE (PF) 100 MCG/2ML IJ SOLN
INTRAMUSCULAR | Status: AC
  Filled 2016-08-08: qty 2

## 2016-08-08 MED ORDER — LACTATED RINGERS IV SOLN: INTRAVENOUS | Status: DC

## 2016-08-08 MED ORDER — ENOXAPARIN SODIUM 40 MG/0.4ML ~~LOC~~ SOLN
40.0000 mg | SUBCUTANEOUS | Status: DC
  Administered 2016-08-09 – 2016-08-13 (×5): 40 mg via SUBCUTANEOUS
  Filled 2016-08-08 (×5): qty 0.4

## 2016-08-08 MED ORDER — LACTATED RINGERS IV SOLN
INTRAVENOUS | Status: DC | PRN
  Administered 2016-08-08: 13:00:00 via INTRAVENOUS

## 2016-08-08 MED ORDER — FENTANYL CITRATE (PF) 100 MCG/2ML IJ SOLN: 25.0000 ug | INTRAMUSCULAR | Status: DC | PRN

## 2016-08-08 MED ORDER — LIDOCAINE HCL (CARDIAC) 20 MG/ML IV SOLN
INTRAVENOUS | Status: DC | PRN
  Administered 2016-08-08: 100 mg via INTRATRACHEAL

## 2016-08-08 MED ORDER — FENTANYL CITRATE (PF) 100 MCG/2ML IJ SOLN
INTRAMUSCULAR | Status: AC
  Filled 2016-08-08: qty 4

## 2016-08-08 MED ORDER — MEPERIDINE HCL 50 MG/ML IJ SOLN: 6.2500 mg | INTRAMUSCULAR | Status: DC | PRN

## 2016-08-08 MED ORDER — VANCOMYCIN HCL IN DEXTROSE 1-5 GM/200ML-% IV SOLN
1000.0000 mg | INTRAVENOUS | Status: AC
  Administered 2016-08-08: 1000 mg via INTRAVENOUS

## 2016-08-08 MED ORDER — METOCLOPRAMIDE HCL 5 MG/ML IJ SOLN: 10.0000 mg | Freq: Once | INTRAMUSCULAR | Status: DC | PRN

## 2016-08-08 MED ORDER — PROPOFOL 10 MG/ML IV BOLUS
INTRAVENOUS | Status: DC | PRN
  Administered 2016-08-08: 200 mg via INTRAVENOUS

## 2016-08-08 MED ORDER — MIDAZOLAM HCL 5 MG/5ML IJ SOLN
INTRAMUSCULAR | Status: DC | PRN
  Administered 2016-08-08: 2 mg via INTRAVENOUS

## 2016-08-08 MED ORDER — LACTATED RINGERS IV SOLN
INTRAVENOUS | Status: DC
  Administered 2016-08-08: 13:00:00 via INTRAVENOUS

## 2016-08-08 MED ORDER — ONDANSETRON HCL 4 MG/2ML IJ SOLN
INTRAMUSCULAR | Status: AC
  Filled 2016-08-08: qty 2

## 2016-08-08 MED ORDER — MIDAZOLAM HCL 2 MG/2ML IJ SOLN
INTRAMUSCULAR | Status: AC
Start: 2016-08-08 — End: 2016-08-08
  Filled 2016-08-08: qty 2

## 2016-08-08 MED ORDER — PROPOFOL 10 MG/ML IV BOLUS
INTRAVENOUS | Status: AC
  Filled 2016-08-08: qty 20

## 2016-08-08 SURGICAL SUPPLY — 33 items
BAG SPEC THK2 15X12 ZIP CLS (MISCELLANEOUS) ×1
BAG ZIPLOCK 12X15 (MISCELLANEOUS) ×3 IMPLANT
BANDAGE ACE 4X5 VEL STRL LF (GAUZE/BANDAGES/DRESSINGS) ×2 IMPLANT
BLADE SURG SZ10 CARB STEEL (BLADE) ×4 IMPLANT
BNDG GAUZE ELAST 4 BULKY (GAUZE/BANDAGES/DRESSINGS) ×4 IMPLANT
COVER SURGICAL LIGHT HANDLE (MISCELLANEOUS) ×3 IMPLANT
CUFF TOURN SGL QUICK 18 (TOURNIQUET CUFF) ×3 IMPLANT
DRAPE EXTREMITY T 121X128X90 (DRAPE) ×2 IMPLANT
DRAPE SURG 17X11 SM STRL (DRAPES) ×3 IMPLANT
DRSG MEPITEL 8X12 (GAUZE/BANDAGES/DRESSINGS) ×1 IMPLANT
ELECT REM PT RETURN 15FT ADLT (MISCELLANEOUS) ×3 IMPLANT
GAUZE SPONGE 4X4 12PLY STRL (GAUZE/BANDAGES/DRESSINGS) ×6 IMPLANT
GLOVE BIO SURGEON STRL SZ8 (GLOVE) ×3 IMPLANT
GLOVE ECLIPSE 7.5 STRL STRAW (GLOVE) ×3 IMPLANT
HANDPIECE INTERPULSE COAX TIP (DISPOSABLE) ×3
KIT BASIN OR (CUSTOM PROCEDURE TRAY) ×3 IMPLANT
MANIFOLD NEPTUNE II (INSTRUMENTS) ×3 IMPLANT
MEPITEL 8X12 (GAUZE/BANDAGES/DRESSINGS) ×1
NS IRRIG 1000ML POUR BTL (IV SOLUTION) ×3 IMPLANT
PACK TOTAL JOINT (CUSTOM PROCEDURE TRAY) ×3 IMPLANT
PAD ABD 7.5X8 STRL (GAUZE/BANDAGES/DRESSINGS) ×4 IMPLANT
PAD CAST 4YDX4 CTTN HI CHSV (CAST SUPPLIES) ×1 IMPLANT
PADDING CAST COTTON 4X4 STRL (CAST SUPPLIES) ×3
POSITIONER SURGICAL ARM (MISCELLANEOUS) ×3 IMPLANT
SET HNDPC FAN SPRY TIP SCT (DISPOSABLE) IMPLANT
SOL PREP POV-IOD 4OZ 10% (MISCELLANEOUS) ×3 IMPLANT
SOL PREP PROV IODINE SCRUB 4OZ (MISCELLANEOUS) ×3 IMPLANT
STAPLER VISISTAT 35W (STAPLE) ×2 IMPLANT
SUT ETHILON 2 0 PSLX (SUTURE) ×8 IMPLANT
SUT PROLENE 3 0 PS 2 (SUTURE) ×1 IMPLANT
TOWEL NATURAL 10PK STERILE (DISPOSABLE) ×2 IMPLANT
TOWEL OR NON WOVEN STRL DISP B (DISPOSABLE) ×2 IMPLANT
WATER STERILE IRR 1500ML POUR (IV SOLUTION) ×3 IMPLANT

## 2016-08-08 NOTE — Op Note (Signed)
NAME:  [name], [name]                ACCOUNT NO.:  [id number]  MEDICAL RECORD NO.:  [id number]  LOCATION:  WA06                         FACILITY:  [facility]  PHYSICIAN:  [place] Corning, M.D.   DATE OF BIRTH:  July 11, 1970  DATE OF PROCEDURE:  08/08/2016 DATE OF DISCHARGE:                              OPERATIVE REPORT   PREOPERATIVE DIAGNOSIS:  Status post 5th ray amputation with prolonged VAC treatment for severe infection of the right foot.  POSTOPERATIVE DIAGNOSIS:  Status post 5th ray amputation with prolonged VAC treatment for severe infection of the right foot.  PROCEDURES: 1. Fourth ray amputation. 2. Excision of skin, subcutaneous tissue, muscle, fascia, and bone at     the site of complex open infection. 3. Complex closure with placement of wound VAC.  SURGEON:  [place] Corning, M.D.  ASSISTANT:  Gary Fleet, P.A.  ANESTHESIA:  General.  BRIEF HISTORY:  [name] is a 46 year old male with long history and significant complaints of right foot pain.  He was treated conservatively with 5th ray amputation, excision of ulcer, placement of wound VAC.  This had gone on for a week.  His white count was coming down.  We changed the wound VAC on the floor, felt that the wound was granulating, but felt that the skin was very tenuous on the dorsal aspect of the foot where there had been severe undermining infection. At this point, we felt that he was ready for attempted closure. Transmet amputation would have been the conservative choice, but given his young age and desire for more aggressive function we felt that we would try to preserve what we could and give it one chance to go on to heal.  He understood the significant risks associated with this decision and did wish to take the risks of trying to preserve the foot.  He was brought to the operating room for this procedure.  DESCRIPTION OF PROCEDURE:  The patient was brought to the operative room and after adequate anesthesia  was obtained with general anesthetic, the patient was placed supine on the operating table.  The right foot was prepped and draped in the usual sterile fashion.  Following this, the attention was turned to the right foot.  Unfortunately, there was necrotic skin on the dorsal aspect of the foot and this was excised. There was necrotic fascia, which was excised.  There was necrotic tissue, which was excised and then attention was then turned towards the toe where the fourth metatarsal and toe were amputated.  I did preserve as much skin as I could to try to hold down and use if possible and attempted wound closure and this was helpful in the delayed wound closure nature of the case.  Once this was done, the foot was copiously and thoroughly lavaged, pulsatile lavage irrigation for 6 L.  The fourth ray had been resected.  We then attempted closure.  We could get good closure over the base of the wound.  The distal portion of the wound also closed well as central portion that was so difficult to get to close.  We did get some closure in this area and put a wound VAC over  the dorsal aspect.  There were some exposed tendon and some exposed fascia.  We did feel that we might be able to get some granulation to come over this and at this point a sterile compressive dressing was applied over the wound VAC and he was taken to the recovery room and was noted to be in satisfactory condition.  Estimated blood loss for the procedure was minimal.     [place] Corning, M.D.     Corliss Skains  D:  08/08/2016  T:  08/08/2016  Job:  [id number]

## 2016-08-08 NOTE — Transfer of Care (Signed)
Immediate Anesthesia Transfer of Care Note  Patient: [name]  Procedure(s) Performed: Procedure(s): RAY AMPUTATION RIGHT FOURTH METATARSAL, DEBRIDEMENT OF SUBQ TISSUE,BONE AND SKIN WITH WOUND CLOSURE (Right)  Patient Location: PACU  Anesthesia Type:General  Level of Consciousness: awake, alert  and oriented  Airway & Oxygen Therapy: Patient Spontanous Breathing and Patient connected to face mask oxygen  Post-op Assessment: Report given to RN and Post -op Vital signs reviewed and stable  Post vital signs: Reviewed and stable  Last Vitals:  Vitals:   08/07/16 2127 08/08/16 0650  BP: 111/72 126/78  Pulse: 76 72  Resp: 16 17  Temp: 37.2 C 36.9 C    Last Pain:  Vitals:   08/08/16 0650  TempSrc: Oral  PainSc:       Patients Stated Pain Goal: 2 (03/03/15 3567)  Complications: No apparent anesthesia complications

## 2016-08-08 NOTE — Anesthesia Postprocedure Evaluation (Signed)
Anesthesia Post Note  Patient: [name]  Procedure(s) Performed: Procedure(s) (LRB): RAY AMPUTATION RIGHT FOURTH METATARSAL, DEBRIDEMENT OF SUBQ TISSUE,BONE AND SKIN WITH WOUND CLOSURE (Right)     Patient location during evaluation: PACU Anesthesia Type: General Level of consciousness: awake and alert Pain management: pain level controlled Vital Signs Assessment: post-procedure vital signs reviewed and stable Respiratory status: spontaneous breathing, nonlabored ventilation, respiratory function stable and patient connected to nasal cannula oxygen Cardiovascular status: blood pressure returned to baseline and stable Postop Assessment: no signs of nausea or vomiting Anesthetic complications: no    Last Vitals:  Vitals:   08/08/16 1545 08/08/16 1600  BP: 135/80 (!) 141/86  Pulse: 65 69  Resp: 13 14  Temp: 36.8 C 37.1 C    Last Pain:  Vitals:   08/08/16 1545  TempSrc:   PainSc: Asleep                 [name]

## 2016-08-08 NOTE — Brief Op Note (Signed)
07/28/2016 - 08/08/2016  8:51 PM  PATIENT:  [name]  46 y.o. male  PRE-OPERATIVE DIAGNOSIS:  infected fifth ray amputation right foot  POST-OPERATIVE DIAGNOSIS:  infected fifth ray amputation right foot  PROCEDURE:  Procedure(s): RAY AMPUTATION RIGHT FOURTH METATARSAL, DEBRIDEMENT OF SUBQ TISSUE,BONE AND SKIN WITH WOUND CLOSURE (Right)  SURGEON:  Surgeon(s) and Role:    Dorna Leitz, MD - Primary  PHYSICIAN ASSISTANT:   ASSISTANTS: bethune   ANESTHESIA:   general  EBL:  Total I/O In: -  Out: 550 [Urine:550]  BLOOD ADMINISTERED:none  DRAINS: none   LOCAL MEDICATIONS USED:  NONE  SPECIMEN:  Source of Specimen:  r foot  DISPOSITION OF SPECIMEN:  N/A  COUNTS:  YES  TOURNIQUET:   Total Tourniquet Time Documented: Calf (Right) - 74 minutes Total: Calf (Right) - 74 minutes   DICTATION: .Other Dictation: Dictation Number [id number]  PLAN OF CARE: Admit to inpatient   PATIENT DISPOSITION:  PACU - hemodynamically stable.   Delay start of Pharmacological VTE agent (>24hrs) due to surgical blood loss or risk of bleeding: no

## 2016-08-08 NOTE — Progress Notes (Addendum)
Patient ID: [name], male   DOB: 03/01/1970, 46 y.o.   MRN: [id number]    PROGRESS NOTE   [name]  HXT:[id number] DOB: Jun 19, 1970 DOA: 07/28/2016  PCP: Heywood Bene, PA-C   Brief Narrative:  Pt is 46 yo male with known DM type II and CKD stage II, presented with right foot infection. XARY on admission worrisome for fifth right foot toe metatarsal osteomyelitis. Patient started on Vanc and Zosyn and TRH asked to admit for further evaluation.    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, nephropathy - last A1C 07/2016 was 5.3 - CBGs remained overall stable - home regimen insulin Humalog 10 units twice a day currently held and will likely be able to resume postoperatively - Keep on sliding scale insulin   AKI on Chronic kidney disease stage 2 - overall improving and within normal limits  - Advance diet postoperatively  - BMP in the morning   Transmaminitis - unclear etiology - AST and ALT have normalized but Alk phosph still elevated  - Patient is asymptomatic from this standpoint, periodic follow-up recommended   Thrombocytosis - Suspect this to be reactive due to acute illness  - We'll follow   HLD - continue statin   Anemia of ? Iron deficiency, microcytic - hemoglobin overall stable  - No evidence of active bleeding  - CBC in the morning   Obesity  - Body mass index is 35.53 kg/m.  DVT prophylaxis: Lovenox  SQ Code Status: Full  Family Communication:  patient updated at bedside  Disposition Plan: to be determined   Consultants:   Ortho  Procedures:   7/12 - Dr. Berenice Primas performed right fifth ray amputation, debridement of ths skin, subQ tissue, muscle, fasca at the site of complex wound infection   Antimicrobials:   Vancomycin 7/10 --> 7/18  Zosyn 7//10 --> 7/17  Unasyn 7/18 -->  Subjective: Patient reports feeling well this morning, ready for surgery   Objective: Vitals:   08/07/16 0535 08/07/16 1512 08/07/16 2127 08/08/16 0650  BP: 132/86 133/77 111/72 126/78  Pulse: 70 73 76 72  Resp: _0 Temp: 98.2 F (36.8 C) 98.6 F (37 C) 98.9 F (37.2 C) 98.5 F (36.9 C)  TempSrc: Oral Oral Oral Oral  SpO2: 100% 100% 100% 100%  Weight:      Height:        Intake/Output Summary (Last 24 hours) at 08/08/16 0831 Last data filed at 08/08/16 0650  Gross per 24 hour  Intake             1600 ml  Output             1380 ml  Net              220 ml   Filed Weights   07/28/16 1831  Weight: 118.8 kg (262 lb)    Physical Exam  Constitutional: Appears well-developed and well-nourished. No distress.  CVS: RRR,  S1/S2 +, no murmurs, no gallops, no carotid bruit.  Pulmonary: Effort and breath sounds normal, no stridor, rhonchi, wheezes, rales.  Abdominal: Soft. BS +,  no distension, tenderness, rebound or guarding.  Musculoskeletal: Normal range of motion.   Data Reviewed: I have personally reviewed following labs and imaging studies  CBC:  Recent Labs Lab 08/03/16 0534 08/04/16 0510 08/05/16 0624 08/06/16 0535 08/07/16 0518 08/08/16 0559  WBC 16.7* 15.9* 15.7* 13.4* 12.5* 11.1*  NEUTROABS 12.6* 11.6*  --   --   --   --   HGB 9.0* 8.6* 9.5* 9.5* 8.9* 9.0*  HCT 27.0* 26.1* 28.2* 29.4* 27.5* 28.2*  MCV 74.4* 74.8* 74.8* 76.8* 77.5* 76.8*  PLT 456* 403* 479* 469* 451* 810*   Basic Metabolic Panel:  Recent Labs Lab 08/04/16 0510 08/05/16 0624 08/06/16 0535  08/07/16 0518 08/08/16 0559  NA 137 137 138 137 137  K 4.5 4.1 4.0 4.5 4.4  CL 102 103 103 105 104  CO2 _0 GLUCOSE 151* 151* 146* 136* 152*  BUN 18 21* 20 20 23*  CREATININE 1.35* 1.28* 1.23 1.25* 1.24  CALCIUM 8.9 9.2 9.2 9.1 9.1   CBG:  Recent Labs Lab 08/07/16 1241 08/07/16 1331 08/07/16 1812 08/07/16 2131 08/08/16 0749  GLUCAP 126* 121* 117* 139* 146*   Urine analysis:    Component Value Date/Time   COLORURINE AMBER (A) 07/28/2016 1846   APPEARANCEUR CLOUDY (A) 07/28/2016 1846   LABSPEC 1.021 07/28/2016 1846   PHURINE 5.0 07/28/2016 1846   GLUCOSEU NEGATIVE 07/28/2016 1846   HGBUR NEGATIVE 07/28/2016 1846   BILIRUBINUR NEGATIVE 07/28/2016 1846   KETONESUR NEGATIVE 07/28/2016 1846   PROTEINUR 100 (A) 07/28/2016 1846   UROBILINOGEN 0.2 01/26/2007 1759   NITRITE NEGATIVE 07/28/2016 1846   LEUKOCYTESUR NEGATIVE 07/28/2016 1846   Recent Results (from the past 240 hour(s))  Culture, blood (Routine x 2)     Status: None   Collection Time: 07/28/16 11:06 PM  Result Value Ref Range Status   Specimen Description BLOOD BLOOD RIGHT HAND  Final   Special Requests   Final    BOTTLES DRAWN AEROBIC AND ANAEROBIC Blood Culture results may not be optimal due to an inadequate volume of blood received in culture bottles   Culture   Final    NO GROWTH 5 DAYS Performed at [facility], [address] [place], [place] [address]    Report Status 08/03/2016 FINAL  Final  Wound or Superficial Culture     Status: None   Collection Time: 07/28/16 11:06 PM  Result Value Ref Range Status   Specimen Description FOOT RIGHT  Final   Special Requests NONE  Final   Gram Stain   Final    NO WBC SEEN FEW GRAM POSITIVE COCCI FEW GRAM NEGATIVE RODS    Culture   Final    MODERATE ENTEROCOCCUS FAECALIS WITHIN MIXED ORGANISMS Performed at [facility], [place] [address] [place],  [address]    Report Status 08/01/2016 FINAL  Final   Organism ID, Bacteria  ENTEROCOCCUS FAECALIS  Final      Susceptibility   Enterococcus faecalis - MIC*    AMPICILLIN <=2 SENSITIVE Sensitive     VANCOMYCIN 1 SENSITIVE Sensitive     GENTAMICIN SYNERGY SENSITIVE Sensitive     * MODERATE ENTEROCOCCUS FAECALIS  Culture, blood (Routine x 2)     Status: None   Collection Time: 07/28/16 11:45 PM  Result Value Ref Range Status  Specimen Description BLOOD LEFT ANTECUBITAL  Final   Special Requests   Final    BOTTLES DRAWN AEROBIC AND ANAEROBIC Blood Culture adequate volume   Culture   Final    NO GROWTH 5 DAYS Performed at [facility], [address], [place], [place] [address]    Report Status 08/03/2016 FINAL  Final  Surgical PCR screen     Status: None   Collection Time: 07/30/16 10:17 PM  Result Value Ref Range Status   MRSA, PCR NEGATIVE NEGATIVE Final   Staphylococcus aureus NEGATIVE NEGATIVE Final  Aerobic/Anaerobic Culture (surgical/deep wound)     Status: None (Preliminary result)   Collection Time: 07/31/16  1:38 PM  Result Value Ref Range Status   Specimen Description TOE RIGHT  Final   Special Requests NONE  Final   Gram Stain   Final    ABUNDANT WBC PRESENT,BOTH PMN AND MONONUCLEAR RARE GRAM POSITIVE COCCI IN PAIRS    Culture   Final    CULTURE REINCUBATED FOR BETTER GROWTH HOLDING FOR POSSIBLE ANAEROBE Performed at [facility], [place] [address], [place], [place] [address]    Report Status PENDING  Incomplete    Radiology Studies: No results found.  Scheduled Meds: . aspirin EC  81 mg Oral Daily  . atorvastatin  40 mg Oral q1800  . enoxaparin (LOVENOX) injection  30 mg Subcutaneous BID  . gemfibrozil  600 mg Oral BID AC  . insulin aspart  0-9 Units Subcutaneous TID WC  . multivitamin with minerals  1 tablet Oral BID  . omega-3 acid ethyl esters  1 g Oral Daily  . povidone-iodine  2 application Topical Once  . vitamin B-12  5,000 mcg Oral Daily   Continuous Infusions: . ampicillin-sulbactam (UNASYN) IV Stopped  (08/08/16 0256)  . vancomycin       LOS: 10 days   Time spent: 25 minutes   Faye Ramsay, MD [facility] Pager [phone]  If 7PM-7AM, please contact night-coverage [email or web address] Password [facility] 08/08/2016, 8:31 AM

## 2016-08-08 NOTE — Progress Notes (Signed)
Subjective: Day of Surgery Procedure(s) (LRB): AMPUTATION RIGHT FOURTH METATARSAL POSSIBLE TRANS METATARSAL AMPUTATION AND WOUND CLOSURE (Right) Patient reports pain as mild.    Objective: Vital signs in last 24 hours: Temp:  [98.5 F (36.9 C)-98.9 F (37.2 C)] 98.5 F (36.9 C) (07/20 0650) Pulse Rate:  [72-76] 72 (07/20 0650) Resp:  [16-18] 17 (07/20 0650) BP: (111-133)/(72-78) 126/78 (07/20 0650) SpO2:  [100 %] 100 % (07/20 0650)  Intake/Output from previous day: 07/19 0701 - 07/20 0700 In: 1990 [P.O.:1560; IV Piggyback:400] Out: 1380 [Urine:1380] Intake/Output this shift: Total I/O In: -  Out: 800 [Urine:800]   Recent Labs  08/06/16 0535 08/07/16 0518 08/08/16 0559  HGB 9.5* 8.9* 9.0*    Recent Labs  08/07/16 0518 08/08/16 0559  WBC 12.5* 11.1*  RBC 3.55* 3.67*  HCT 27.5* 28.2*  PLT 451* 460*    Recent Labs  08/07/16 0518 08/08/16 0559  NA 137 137  K 4.5 4.4  CL 105 104  CO2 26 25  BUN 20 23*  CREATININE 1.25* 1.24  GLUCOSE 136* 152*  CALCIUM 9.1 9.1   No results for input(s): LABPT, INR in the last 72 hours.  No cellulitis present Compartment soft  Wound open with mod sts  Assessment/Plan: Day of Surgery Procedure(s) (LRB): AMPUTATION RIGHT FOURTH METATARSAL POSSIBLE TRANS METATARSAL AMPUTATION AND WOUND CLOSURE (Right) Up with therapy will need several wks of iv abx afetr wound closure  Kieon Lawhorn L 08/08/2016, 12:59 PM

## 2016-08-08 NOTE — Anesthesia Preprocedure Evaluation (Signed)
Anesthesia Evaluation  Patient identified by MRN, date of birth, ID band Patient awake    Reviewed: Allergy & Precautions, NPO status , Patient's Chart, lab work & pertinent test results  Airway Mallampati: II  TM Distance: >3 FB Neck ROM: Full    Dental no notable dental hx.    Pulmonary neg pulmonary ROS,    Pulmonary exam normal breath sounds clear to auscultation       Cardiovascular hypertension, Pt. on medications negative cardio ROS Normal cardiovascular exam Rhythm:Regular Rate:Normal     Neuro/Psych negative neurological ROS  negative psych ROS   GI/Hepatic negative GI ROS, Neg liver ROS,   Endo/Other  negative endocrine ROSdiabetes, Type 2  Renal/GU negative Renal ROS  negative genitourinary   Musculoskeletal negative musculoskeletal ROS (+)   Abdominal   Peds negative pediatric ROS (+)  Hematology negative hematology ROS (+)   Anesthesia Other Findings   Reproductive/Obstetrics negative OB ROS                             Anesthesia Physical Anesthesia Plan  ASA: II  Anesthesia Plan: General   Post-op Pain Management:    Induction: Intravenous  PONV Risk Score and Plan: 2 and Ondansetron and Dexamethasone  Airway Management Planned: LMA  Additional Equipment:   Intra-op Plan:   Post-operative Plan: Extubation in OR  Informed Consent: I have reviewed the patients History and Physical, chart, labs and discussed the procedure including the risks, benefits and alternatives for the proposed anesthesia with the patient or authorized representative who has indicated his/her understanding and acceptance.   Dental advisory given  Plan Discussed with: CRNA  Anesthesia Plan Comments:         Anesthesia Quick Evaluation

## 2016-08-08 NOTE — Consult Note (Signed)
[place] nurse team following along, noted that orthopedics plans to take patient to surgery today. To have dressing removed in OR today and possible wound closure.  Re consult if needed, will not follow at this time. Thanks  Chyrl Elwell [employer], RN,CWOCN, CNS, [place] [phone])

## 2016-08-08 NOTE — Anesthesia Procedure Notes (Signed)
Procedure Name: LMA Insertion Date/Time: 08/08/2016 1:17 PM Performed by: [place] Ryah Cribb C Pre-anesthesia Checklist: Patient identified, Emergency Drugs available, Suction available and Patient being monitored Patient Re-evaluated:Patient Re-evaluated prior to induction Oxygen Delivery Method: Circle system utilized Preoxygenation: Pre-oxygenation with 100% oxygen Induction Type: IV induction Ventilation: Mask ventilation without difficulty LMA: LMA inserted and LMA with gastric port inserted LMA Size: 5.0 Number of attempts: 1 Airway Equipment and Method: Bite block Placement Confirmation: positive ETCO2 Tube secured with: Tape Dental Injury: Teeth and Oropharynx as per pre-operative assessment

## 2016-08-09 LAB — CBC
HCT: 28 % — ABNORMAL LOW (ref 39.0–52.0)
Hemoglobin: 9 g/dL — ABNORMAL LOW (ref 13.0–17.0)
MCH: 24.9 pg — ABNORMAL LOW (ref 26.0–34.0)
MCHC: 32.1 g/dL (ref 30.0–36.0)
MCV: 77.6 fL — ABNORMAL LOW (ref 78.0–100.0)
Platelets: 444 10*3/uL — ABNORMAL HIGH (ref 150–400)
RBC: 3.61 MIL/uL — ABNORMAL LOW (ref 4.22–5.81)
RDW: 14.3 % (ref 11.5–15.5)
WBC: 14.2 10*3/uL — ABNORMAL HIGH (ref 4.0–10.5)

## 2016-08-09 LAB — GLUCOSE, CAPILLARY
Glucose-Capillary: 130 mg/dL — ABNORMAL HIGH (ref 65–99)
Glucose-Capillary: 129 mg/dL — ABNORMAL HIGH (ref 65–99)
Glucose-Capillary: 137 mg/dL — ABNORMAL HIGH (ref 65–99)
Glucose-Capillary: 123 mg/dL — ABNORMAL HIGH (ref 65–99)

## 2016-08-09 LAB — BASIC METABOLIC PANEL
Anion gap: 10 (ref 5–15)
BUN: 20 mg/dL (ref 6–20)
CO2: 25 mmol/L (ref 22–32)
Calcium: 9 mg/dL (ref 8.9–10.3)
Chloride: 103 mmol/L (ref 101–111)
Creatinine, Ser: 1.18 mg/dL (ref 0.61–1.24)
GFR calc Af Amer: >60 mL/min (ref >60)
GFR calc non Af Amer: >60 mL/min (ref >60)
Glucose, Bld: 134 mg/dL — ABNORMAL HIGH (ref 65–99)
Potassium: 4.1 mmol/L (ref 3.5–5.1)
Sodium: 138 mmol/L (ref 135–145)

## 2016-08-09 NOTE — Progress Notes (Addendum)
Patient ID: [name], male   DOB: Jan 14, 1971, 46 y.o.   MRN: [id number]    PROGRESS NOTE   [name]  WUJ:[id number] DOB: March 04, 1970 DOA: 07/28/2016  PCP: Heywood Bene, PA-C   Brief Narrative:  Pt is 46 yo male with known DM type II and CKD stage II, presented with right foot infection. XARY on admission worrisome for fifth right foot toe metatarsal osteomyelitis. Patient started on Vanc and Zosyn and TRH asked to admit for further evaluation.    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 Iron deficiency, microcytic - Hg overall stable, no active bleeding - CBC in AM  Obesity  - Body mass index is 35.53 kg/m.  DVT prophylaxis: Lovenox SQ Code Status: Full  Family Communication:  patient updated at bedside  Disposition Plan: to be determined   Consultants:   Ortho  Procedures:   7/12 - Dr. Berenice Primas performed right fifth ray amputation, debridement of ths skin, subQ tissue, muscle, fasca at the site of  complex wound infection   7/20 - 4th ray amputation on the right foot   Antimicrobials:   Vancomycin 7/10 --> 7/18  Zosyn 7//10 --> 7/17  Unasyn 7/18 -->  Subjective: No pain this AM.  Objective: Vitals:   08/08/16 2143 08/09/16 0125 08/09/16 0558 08/09/16 1024  BP: 139/85 125/75 129/74 109/69  Pulse: 65 74 75 81  Resp: 16 16 16 18   Temp: 97.9 F (36.6 C) 99.2 F (37.3 C) 99.1 F (37.3 C) 98.9 F (37.2 C)  TempSrc: Oral Oral Oral   SpO2: 100% 100% 100% 100%  Weight:      Height:        Intake/Output Summary (Last 24 hours) at 08/09/16 1234 Last data filed at 08/09/16 0700  Gross per 24 hour  Intake             1350 ml  Output             1835 ml  Net             -485 ml   Filed Weights   07/28/16 1831  Weight: 118.8 kg (262 lb)    Physical Exam  Constitutional: Appears well-developed and well-nourished. No distress.  CVS: RRR, S1/S2 +, no murmurs, no gallops, no carotid bruit.  Pulmonary: Effort and breath sounds normal, no stridor, rhonchi, wheezes, rales.  Abdominal: Soft. BS +,  no distension, tenderness, rebound  or guarding.  Musculoskeletal: Normal range of motion. Wound vac in place    Data Reviewed: I have personally reviewed following labs and imaging studies  CBC:  Recent Labs Lab 08/03/16 0534 08/04/16 0510 08/05/16 0624 08/06/16 0535 08/07/16 0518 08/08/16 0559 08/09/16 0457  WBC 16.7* 15.9* 15.7* 13.4* 12.5* 11.1* 14.2*  NEUTROABS 12.6* 11.6*  --   --   --   --   --   HGB 9.0* 8.6* 9.5* 9.5* 8.9* 9.0* 9.0*  HCT 27.0* 26.1* 28.2* 29.4* 27.5* 28.2* 28.0*  MCV 74.4* 74.8* 74.8* 76.8* 77.5* 76.8* 77.6*  PLT 456* 403* 479* 469* 451* 460* 680*   Basic Metabolic Panel:  Recent Labs Lab 08/05/16 0624 08/06/16 0535 08/07/16 0518 08/08/16 0559 08/09/16 0457  NA 137 138 137 137 138  K 4.1 4.0 4.5 4.4 4.1  CL 103 103 105 104 103  CO2 25 25 26 25 25   GLUCOSE 151* 146* 136* 152* 134*  BUN 21* 20 20 23* 20  CREATININE 1.28* 1.23  1.25* 1.24 1.18  CALCIUM 9.2 9.2 9.1 9.1 9.0   CBG:  Recent Labs Lab 08/08/16 1502 08/08/16 1704 08/08/16 2146 08/09/16 0744 08/09/16 1218  GLUCAP 132* 143* 128* 130* 129*   Urine analysis:    Component Value Date/Time   COLORURINE AMBER (A) 07/28/2016 1846   APPEARANCEUR CLOUDY (A) 07/28/2016 1846   LABSPEC 1.021 07/28/2016 1846   PHURINE 5.0 07/28/2016 1846   GLUCOSEU NEGATIVE 07/28/2016 1846   HGBUR NEGATIVE 07/28/2016 1846   BILIRUBINUR NEGATIVE 07/28/2016 1846   KETONESUR NEGATIVE 07/28/2016 1846   PROTEINUR 100 (A) 07/28/2016 1846   UROBILINOGEN 0.2 01/26/2007 1759   NITRITE NEGATIVE 07/28/2016 1846   LEUKOCYTESUR NEGATIVE 07/28/2016 1846   Recent Results (from the past 240 hour(s))  Culture, blood (Routine x 2)     Status: None   Collection Time: 07/28/16 11:06 PM  Result Value Ref Range Status   Specimen Description BLOOD BLOOD RIGHT HAND  Final   Special Requests   Final    BOTTLES DRAWN AEROBIC AND ANAEROBIC Blood Culture results may not be optimal due to an inadequate volume of blood received in culture bottles   Culture   Final    NO GROWTH 5 DAYS Performed at [facility], [address] [place], [place] [address]    Report Status 08/03/2016 FINAL  Final  Wound or Superficial Culture     Status: None   Collection Time: 07/28/16 11:06 PM  Result Value Ref Range Status   Specimen Description FOOT RIGHT  Final   Special Requests NONE  Final   Gram Stain   Final    NO WBC SEEN FEW GRAM POSITIVE COCCI FEW GRAM NEGATIVE RODS    Culture   Final    MODERATE ENTEROCOCCUS FAECALIS WITHIN MIXED ORGANISMS Performed at [facility], [place] [address] [place], [place] [address]    Report Status 08/01/2016 FINAL  Final   Organism ID, Bacteria ENTEROCOCCUS FAECALIS  Final      Susceptibility   Enterococcus faecalis - MIC*    AMPICILLIN <=2 SENSITIVE Sensitive     VANCOMYCIN 1 SENSITIVE Sensitive     GENTAMICIN SYNERGY SENSITIVE Sensitive     *  MODERATE ENTEROCOCCUS FAECALIS  Culture, blood (Routine x 2)     Status: None   Collection Time: 07/28/16 11:45 PM  Result Value Ref Range Status   Specimen Description BLOOD LEFT ANTECUBITAL  Final   Special Requests   Final  BOTTLES DRAWN AEROBIC AND ANAEROBIC Blood Culture adequate volume   Culture   Final    NO GROWTH 5 DAYS Performed at [facility], [place] [address], [place], [place] [address]    Report Status 08/03/2016 FINAL  Final  Surgical PCR screen     Status: None   Collection Time: 07/30/16 10:17 PM  Result Value Ref Range Status   MRSA, PCR NEGATIVE NEGATIVE Final   Staphylococcus aureus NEGATIVE NEGATIVE Final  Aerobic/Anaerobic Culture (surgical/deep wound)     Status: None (Preliminary result)   Collection Time: 07/31/16  1:38 PM  Result Value Ref Range Status   Specimen Description TOE RIGHT  Final   Special Requests NONE  Final   Gram Stain   Final    ABUNDANT WBC PRESENT,BOTH PMN AND MONONUCLEAR RARE GRAM POSITIVE COCCI IN PAIRS    Culture   Final    CULTURE REINCUBATED FOR BETTER GROWTH HOLDING FOR POSSIBLE ANAEROBE Performed at [facility], [place] [address] [place], [place] [address]    Report Status PENDING  Incomplete    Radiology Studies: No results found.  Scheduled Meds: . aspirin EC  81 mg Oral Daily  . atorvastatin  40 mg Oral q1800  . enoxaparin (LOVENOX) injection  40 mg Subcutaneous Q24H  . gemfibrozil  600 mg Oral BID AC  . insulin aspart  0-9 Units Subcutaneous TID WC  . multivitamin with minerals  1 tablet Oral BID  . omega-3 acid ethyl esters  1 g Oral Daily  . vitamin B-12  5,000 mcg Oral Daily   Continuous Infusions: . ampicillin-sulbactam (UNASYN) IV Stopped (08/09/16 0926)     LOS: 11 days   Time spent: 25 minutes  Faye Ramsay, MD [facility] Pager [phone]  If 7PM-7AM, please contact night-coverage [email or web address] Password [facility] 08/09/2016, 12:34 PM

## 2016-08-10 LAB — CBC
HCT: 26.6 % — ABNORMAL LOW (ref 39.0–52.0)
Hemoglobin: 8.6 g/dL — ABNORMAL LOW (ref 13.0–17.0)
MCH: 25.1 pg — ABNORMAL LOW (ref 26.0–34.0)
MCHC: 32.3 g/dL (ref 30.0–36.0)
MCV: 77.6 fL — ABNORMAL LOW (ref 78.0–100.0)
Platelets: 465 10*3/uL — ABNORMAL HIGH (ref 150–400)
RBC: 3.43 MIL/uL — ABNORMAL LOW (ref 4.22–5.81)
RDW: 14.4 % (ref 11.5–15.5)
WBC: 12.9 10*3/uL — ABNORMAL HIGH (ref 4.0–10.5)

## 2016-08-10 LAB — GLUCOSE, CAPILLARY
Glucose-Capillary: 126 mg/dL — ABNORMAL HIGH (ref 65–99)
Glucose-Capillary: 122 mg/dL — ABNORMAL HIGH (ref 65–99)
Glucose-Capillary: 134 mg/dL — ABNORMAL HIGH (ref 65–99)
Glucose-Capillary: 114 mg/dL — ABNORMAL HIGH (ref 65–99)

## 2016-08-10 LAB — BASIC METABOLIC PANEL
Anion gap: 9 (ref 5–15)
BUN: 18 mg/dL (ref 6–20)
CO2: 24 mmol/L (ref 22–32)
Calcium: 8.9 mg/dL (ref 8.9–10.3)
Chloride: 105 mmol/L (ref 101–111)
Creatinine, Ser: 1.19 mg/dL (ref 0.61–1.24)
GFR calc Af Amer: >60 mL/min (ref >60)
GFR calc non Af Amer: >60 mL/min (ref >60)
Glucose, Bld: 128 mg/dL — ABNORMAL HIGH (ref 65–99)
Potassium: 3.9 mmol/L (ref 3.5–5.1)
Sodium: 138 mmol/L (ref 135–145)

## 2016-08-10 NOTE — Progress Notes (Signed)
Patient ID: [name], male   DOB: [birth year], 46 y.o.   MRN: [id number]    PROGRESS NOTE   [name]  EQA:[id number] DOB: 1970-03-15 DOA: 07/28/2016  PCP: Heywood Bene, PA-C   Brief Narrative:  Pt is 46 yo male with known DM type II and CKD stage II, presented with right foot infection. XARY on admission worrisome for fifth right foot toe metatarsal osteomyelitis. Patient started on Vanc and Zosyn and TRH asked to admit for further evaluation.    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 Iron deficiency, microcytic - Hg slightly down in the past 24 hours - no signs of active bleeding - CBC in AM  Obesity  - Body mass index is 35.53 kg/m.  DVT prophylaxis: Lovenox SQ Code Status: Full  Family Communication:  patient updated at bedside  Disposition Plan: home when ortho team clears   Consultants:   Ortho  Procedures:   7/12 - Dr. Berenice Primas performed right fifth ray amputation, debridement of ths skin, subQ tissue, muscle, fasca at the site of complex wound infection   7/20 - 4th ray amputation on the right foot   Antimicrobials:   Vancomycin 7/10 -->  7/18  Zosyn 7//10 --> 7/17  Unasyn 7/18 -->  Subjective: No concerns this AM.  Objective: Vitals:   08/09/16 1024 08/09/16 1414 08/09/16 2114 08/10/16 0420  BP: 109/69 119/77 117/82 111/64  Pulse: 81 79 87 80  Resp: 18 18 17 17   Temp: 98.9 F (37.2 C) 99.5 F (37.5 C) 98.7 F (37.1 C) 98.7 F (37.1 C)  TempSrc:   Oral Oral  SpO2: 100% 100% 98% 100%  Weight:      Height:        Intake/Output Summary (Last 24 hours) at 08/10/16 0945 Last data filed at 08/10/16 0421  Gross per 24 hour  Intake              500 ml  Output              500 ml  Net                0 ml   Filed Weights   07/28/16 1831  Weight: 118.8 kg (262 lb)    Physical Exam  Constitutional: Appears well-developed and well-nourished. No distress.  CVS: RRR, S1/S2 +, no murmurs, no gallops, no carotid bruit.  Pulmonary: Effort and breath sounds normal, no stridor, rhonchi, wheezes, rales.  Abdominal: Soft. BS +,  no distension, tenderness, rebound or guarding.  Musculoskeletal: Normal range of motion.  Neuro: Alert. Normal reflexes, muscle tone coordination. No cranial nerve deficit.  Data Reviewed:  I have personally reviewed following labs and imaging studies  CBC:  Recent Labs Lab 08/04/16 0510  08/06/16 0535 08/07/16 0518 08/08/16 0559 08/09/16 0457 08/10/16 0519  WBC 15.9*  < > 13.4* 12.5* 11.1* 14.2* 12.9*  NEUTROABS 11.6*  --   --   --   --   --   --   HGB 8.6*  < > 9.5* 8.9* 9.0* 9.0* 8.6*  HCT 26.1*  < > 29.4* 27.5* 28.2* 28.0* 26.6*  MCV 74.8*  < > 76.8* 77.5* 76.8* 77.6* 77.6*  PLT 403*  < > 469* 451* 460* 444* 465*  < > = values in this interval not displayed. Basic Metabolic Panel:  Recent Labs Lab 08/06/16 0535 08/07/16 0518 08/08/16 0559 08/09/16 0457 08/10/16 0519  NA 138 137 137 138 138  K 4.0 4.5 4.4 4.1 3.9  CL 103 105 104 103 105  CO2 25 26 25 25 24   GLUCOSE 146* 136* 152* 134* 128*  BUN 20 20 23* 20 18  CREATININE 1.23 1.25* 1.24 1.18 1.19  CALCIUM 9.2 9.1  9.1 9.0 8.9   CBG:  Recent Labs Lab 08/09/16 0744 08/09/16 1218 08/09/16 1851 08/09/16 2113 08/10/16 0722  GLUCAP 130* 129* 137* 123* 126*   Urine analysis:    Component Value Date/Time   COLORURINE AMBER (A) 07/28/2016 1846   APPEARANCEUR CLOUDY (A) 07/28/2016 1846   LABSPEC 1.021 07/28/2016 1846   PHURINE 5.0 07/28/2016 1846   GLUCOSEU NEGATIVE 07/28/2016 1846   HGBUR NEGATIVE 07/28/2016 1846   BILIRUBINUR NEGATIVE 07/28/2016 1846   KETONESUR NEGATIVE 07/28/2016 1846   PROTEINUR 100 (A) 07/28/2016 1846   UROBILINOGEN 0.2 01/26/2007 1759   NITRITE NEGATIVE 07/28/2016 1846   LEUKOCYTESUR NEGATIVE 07/28/2016 1846   Recent Results (from the past 240 hour(s))  Culture, blood (Routine x 2)     Status: None   Collection Time: 07/28/16 11:06 PM  Result Value Ref Range Status   Specimen Description BLOOD BLOOD RIGHT HAND  Final   Special Requests   Final    BOTTLES DRAWN AEROBIC AND ANAEROBIC Blood Culture results may not be optimal due to an inadequate volume of blood received in culture bottles   Culture   Final    NO GROWTH 5 DAYS Performed at [facility], [place] [address], [place], [place] [address]    Report Status 08/03/2016 FINAL  Final  Wound or Superficial Culture     Status: None   Collection Time: 07/28/16 11:06 PM  Result Value Ref Range Status   Specimen Description FOOT RIGHT  Final   Special Requests NONE  Final   Gram Stain   Final    NO WBC SEEN FEW GRAM POSITIVE COCCI FEW GRAM NEGATIVE RODS    Culture   Final    MODERATE ENTEROCOCCUS FAECALIS WITHIN MIXED ORGANISMS Performed at [facility], [place] [address], [place], [place] [address]    Report Status 08/01/2016 FINAL  Final   Organism ID, Bacteria ENTEROCOCCUS FAECALIS  Final      Susceptibility   Enterococcus faecalis - MIC*    AMPICILLIN <=2 SENSITIVE Sensitive     VANCOMYCIN 1 SENSITIVE Sensitive     GENTAMICIN SYNERGY SENSITIVE Sensitive     * MODERATE ENTEROCOCCUS FAECALIS   Culture, blood (Routine x 2)     Status: None   Collection Time: 07/28/16 11:45 PM  Result Value Ref Range Status   Specimen Description BLOOD LEFT ANTECUBITAL  Final   Special Requests  Final    BOTTLES DRAWN AEROBIC AND ANAEROBIC Blood Culture adequate volume   Culture   Final    NO GROWTH 5 DAYS Performed at [facility], [place] [address] [place], [place] [address]    Report Status 08/03/2016 FINAL  Final  Surgical PCR screen     Status: None   Collection Time: 07/30/16 10:17 PM  Result Value Ref Range Status   MRSA, PCR NEGATIVE NEGATIVE Final   Staphylococcus aureus NEGATIVE NEGATIVE Final  Aerobic/Anaerobic Culture (surgical/deep wound)     Status: None (Preliminary result)   Collection Time: 07/31/16  1:38 PM  Result Value Ref Range Status   Specimen Description TOE RIGHT  Final   Special Requests NONE  Final   Gram Stain   Final    ABUNDANT WBC PRESENT,BOTH PMN AND MONONUCLEAR RARE GRAM POSITIVE COCCI IN PAIRS    Culture   Final    CULTURE REINCUBATED FOR BETTER GROWTH HOLDING FOR POSSIBLE ANAEROBE Performed at [facility], [place] [address] [place], [place] [address]    Report Status PENDING  Incomplete    Radiology Studies: No results found.  Scheduled Meds: . aspirin EC  81 mg Oral Daily  . atorvastatin  40 mg Oral q1800  . enoxaparin (LOVENOX) injection  40 mg Subcutaneous Q24H  . gemfibrozil  600 mg Oral BID AC  . insulin aspart  0-9 Units Subcutaneous TID WC  . multivitamin with minerals  1 tablet Oral BID  . omega-3 acid ethyl esters  1 g Oral Daily  . vitamin B-12  5,000 mcg Oral Daily   Continuous Infusions: . ampicillin-sulbactam (UNASYN) IV Stopped (08/10/16 0506)     LOS: 12 days   Time spent: 25 minutes   Faye Ramsay, MD [facility] Pager [phone]  If 7PM-7AM, please contact night-coverage [email or web address] Password [facility] 08/10/2016, 9:45 AM

## 2016-08-11 LAB — GLUCOSE, CAPILLARY
Glucose-Capillary: 120 mg/dL — ABNORMAL HIGH (ref 65–99)
Glucose-Capillary: 107 mg/dL — ABNORMAL HIGH (ref 65–99)
Glucose-Capillary: 106 mg/dL — ABNORMAL HIGH (ref 65–99)
Glucose-Capillary: 121 mg/dL — ABNORMAL HIGH (ref 65–99)

## 2016-08-11 LAB — BASIC METABOLIC PANEL
Anion gap: 9 (ref 5–15)
BUN: 19 mg/dL (ref 6–20)
CO2: 26 mmol/L (ref 22–32)
Calcium: 9 mg/dL (ref 8.9–10.3)
Chloride: 103 mmol/L (ref 101–111)
Creatinine, Ser: 1.23 mg/dL (ref 0.61–1.24)
GFR calc Af Amer: >60 mL/min (ref >60)
GFR calc non Af Amer: >60 mL/min (ref >60)
Glucose, Bld: 130 mg/dL — ABNORMAL HIGH (ref 65–99)
Potassium: 3.9 mmol/L (ref 3.5–5.1)
Sodium: 138 mmol/L (ref 135–145)

## 2016-08-11 LAB — CBC
HCT: 25.6 % — ABNORMAL LOW (ref 39.0–52.0)
Hemoglobin: 8.2 g/dL — ABNORMAL LOW (ref 13.0–17.0)
MCH: 24.3 pg — ABNORMAL LOW (ref 26.0–34.0)
MCHC: 32 g/dL (ref 30.0–36.0)
MCV: 76 fL — ABNORMAL LOW (ref 78.0–100.0)
Platelets: 439 10*3/uL — ABNORMAL HIGH (ref 150–400)
RBC: 3.37 MIL/uL — ABNORMAL LOW (ref 4.22–5.81)
RDW: 14.3 % (ref 11.5–15.5)
WBC: 10.3 10*3/uL (ref 4.0–10.5)

## 2016-08-11 NOTE — Progress Notes (Addendum)
Subjective: 3 Days Post-Op Procedure(s) (LRB): RAY AMPUTATION RIGHT FOURTH METATARSAL, DEBRIDEMENT OF SUBQ TISSUE,BONE AND SKIN WITH WOUND CLOSURE (Right) Patient reports pain as mild.    Objective: Vital signs in last 24 hours: Temp:  [98.7 F (37.1 C)-99.4 F (37.4 C)] 98.8 F (37.1 C) (07/23 0531) Pulse Rate:  [72-79] 72 (07/23 0531) Resp:  [16-18] 16 (07/23 0531) BP: (114-121)/(71-75) 121/75 (07/23 0531) SpO2:  [100 %] 100 % (07/23 0531)  Intake/Output from previous day: 07/22 0701 - 07/23 0700 In: 840 [P.O.:840] Out: 1075 [Urine:1075] Intake/Output this shift: No intake/output data recorded.   Recent Labs  08/09/16 0457 08/10/16 0519 08/11/16 0445  HGB 9.0* 8.6* 8.2*    Recent Labs  08/10/16 0519 08/11/16 0445  WBC 12.9* 10.3  RBC 3.43* 3.37*  HCT 26.6* 25.6*  PLT 465* 439*    Recent Labs  08/10/16 0519 08/11/16 0445  NA 138 138  K 3.9 3.9  CL 105 103  CO2 24 26  BUN 18 19  CREATININE 1.19 1.23  GLUCOSE 128* 130*  CALCIUM 8.9 9.0   No results for input(s): LABPT, INR in the last 72 hours.  ABD soft No cellulitis present Compartment soft The portion of the wound proximally and distally looks good. The portion of skin that had devitalized and was removed looks clean but there is clearly exposed tendon on the dorsal aspect of the foot over a 3X3  centimeter area with depth of 1 cm. Assessment/Plan: 3 Days Post-Op Procedure(s) (LRB): RAY AMPUTATION RIGHT FOURTH METATARSAL, DEBRIDEMENT OF SUBQ TISSUE,BONE AND SKIN WITH WOUND CLOSURE (Right) Will continue wound VAC currently and will plan for D?C when home wound VAC approved for 1 month.  Will need specifically KCI as the indication will need White sponge over the dorsal area with exposed tendon and 3X3 cm area with 1 cm depth. For discharge the patient will need a PICC line. I would think he should have IV antibiotics for approximately 2 weeks at home while the wound is continuing to granulate. He will  need home wound VAC over this  area where the tendons are exposed and certainly would not hurt to have continued VAC use over the wound if there is a way to do this in combination. I will consult the wound nurse and have her replace this today. Aleicia Kenagy L 08/11/2016, 8:09 AM

## 2016-08-11 NOTE — Progress Notes (Addendum)
Patient ID: [name], male   DOB: 08/16/1970, 46 y.o.   MRN: [id number]    PROGRESS NOTE   [name]  NLG:[id number] DOB: [birth year] DOA: 07/28/2016  PCP: Heywood Bene, PA-C   Brief Narrative:  Pt is 46 yo male with known DM type II and CKD stage II, presented with right foot infection. XARY on admission worrisome for fifth right foot toe metatarsal osteomyelitis. Patient started on Vanc and Zosyn and TRH asked to admit for further evaluation.    Assessment & Plan:  Right foot osteomyelitis, leukocytosis  would culture positive for Enterococcus Faecalis, sensitive to Vancomycin  - s/p right fifth ray amputation, application of wound vac, post op day #11 - s/p 4th ray amputation, complex closure with placement of wound VAC, post op day #3 - Patient has been on vancomycin and Zosyn, based on sensitivity report, antibiotics changed to Unasyn on 08/06/2016 - will keep on Unasyn  - 13 cm incision line clean, dorsal wound distal to 4th digit measures 3.5 cm x 4 cm x 0.2 cm, exposed tendon - per ortho, may need treatment with A-CELL prior to discharge, possibly to take to [place] or Thursday - analgesia as needed  - WBC is now WNL   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 Iron deficiency, microcytic - Hg slightly down in the past 24 hours - no evidence of active bleeding  - CBC in AM  Obesity  - Body mass index is 35.53 kg/m.  DVT prophylaxis: Lovenox SQ Code Status: Full  Family Communication:  patient updated at bedside  Disposition Plan: home when ortho team clears   Consultants:   Ortho  Procedures:   7/12 - Dr. Berenice Primas performed right fifth ray amputation, debridement of ths skin,  subQ tissue, muscle, fasca at the site of complex wound infection   7/20 - 4th ray amputation on the right foot   Antimicrobials:   Vancomycin 7/10 --> 7/18  Zosyn 7//10 --> 7/17  Unasyn 7/18 -->  Subjective: No concerns this AM.  Objective: Vitals:   08/10/16 0420 08/10/16 1321 08/10/16 2156 08/11/16 0531  BP: 111/64 114/71 114/73 121/75  Pulse: 80 74 79 72  Resp: 17 18 16 16   Temp: 98.7 F (37.1 C) 98.7 F (37.1 C) 99.4 F (37.4 C) 98.8 F (37.1 C)  TempSrc: Oral Oral Oral Oral  SpO2: 100% 100% 100% 100%  Weight:      Height:        Intake/Output Summary (Last 24 hours) at 08/11/16 1147 Last data filed at 08/11/16 1002  Gross per 24 hour  Intake              960 ml  Output              500 ml  Net              460 ml   Filed Weights   07/28/16 1831  Weight: 118.8 kg (262 lb)    Physical Exam  Constitutional: Appears well-developed and well-nourished. No distress.  CVS: RRR, S1/S2 +, no murmurs, no gallops, no carotid bruit.  Pulmonary: Effort and breath sounds normal, no stridor, rhonchi, wheezes, rales.  Abdominal: Soft. BS +,  no distension, tenderness, rebound or guarding.  Musculoskeletal: Normal range of motion. No edema and no tenderness.  Neuro: Alert. Normal reflexes, muscle tone coordination. No cranial nerve deficit. Psychiatric: Normal mood and affect. Behavior, judgment, thought content normal.    Data Reviewed: I have personally reviewed following labs and imaging studies  CBC:  Recent Labs Lab 08/07/16 0518 08/08/16 0559 08/09/16 0457 08/10/16 0519 08/11/16 0445  WBC 12.5* 11.1* 14.2* 12.9* 10.3  HGB 8.9* 9.0* 9.0* 8.6* 8.2*  HCT 27.5* 28.2* 28.0* 26.6* 25.6*  MCV 77.5* 76.8* 77.6* 77.6* 76.0*  PLT 451* 460* 444* 465* 941*   Basic Metabolic Panel:  Recent Labs Lab 08/07/16 0518 08/08/16 0559 08/09/16 0457 08/10/16 0519 08/11/16 0445  NA 137 137 138 138 138  K 4.5 4.4 4.1 3.9 3.9  CL 105 104 103 105 103  CO2 26 25 25 24  26   GLUCOSE 136* 152* 134* 128* 130*  BUN 20 23* 20 18 19   CREATININE 1.25* 1.24 1.18 1.19 1.23  CALCIUM 9.1 9.1 9.0 8.9 9.0   CBG:  Recent Labs Lab 08/10/16 0722 08/10/16 1144 08/10/16 1701 08/10/16 2201 08/11/16 0741  GLUCAP 126* 122* 134* 114* 120*   Urine analysis:    Component Value Date/Time   COLORURINE AMBER (A) 07/28/2016 1846   APPEARANCEUR CLOUDY (A) 07/28/2016 1846   LABSPEC 1.021 07/28/2016 1846   PHURINE 5.0 07/28/2016 1846   GLUCOSEU NEGATIVE 07/28/2016 1846   HGBUR NEGATIVE 07/28/2016 1846   BILIRUBINUR NEGATIVE 07/28/2016 1846   KETONESUR NEGATIVE 07/28/2016 1846   PROTEINUR 100 (A) 07/28/2016 1846   UROBILINOGEN 0.2 01/26/2007 1759   NITRITE NEGATIVE 07/28/2016 1846   LEUKOCYTESUR NEGATIVE 07/28/2016 1846   Recent Results (from the past 240 hour(s))  Culture, blood (Routine x 2)     Status: None   Collection Time: 07/28/16 11:06 PM  Result Value Ref Range Status   Specimen Description BLOOD BLOOD RIGHT HAND  Final   Special Requests   Final    BOTTLES DRAWN AEROBIC AND ANAEROBIC Blood Culture results may not be optimal due to an inadequate volume of blood received in culture bottles   Culture   Final    NO GROWTH 5 DAYS Performed at [facility], [place] [address], [place], [place] [address]    Report Status 08/03/2016 FINAL  Final  Wound or Superficial Culture     Status: None   Collection Time: 07/28/16 11:06 PM  Result Value Ref Range Status   Specimen Description FOOT RIGHT  Final   Special Requests NONE  Final   Gram Stain   Final    NO WBC SEEN FEW GRAM POSITIVE COCCI FEW GRAM NEGATIVE RODS    Culture   Final    MODERATE ENTEROCOCCUS FAECALIS WITHIN MIXED ORGANISMS Performed at [facility], [place] [address] [place], [place] [address]    Report Status 08/01/2016 FINAL  Final   Organism ID, Bacteria ENTEROCOCCUS FAECALIS  Final      Susceptibility   Enterococcus faecalis - MIC*    AMPICILLIN <=2 SENSITIVE Sensitive      VANCOMYCIN 1 SENSITIVE Sensitive     GENTAMICIN SYNERGY SENSITIVE Sensitive     * MODERATE ENTEROCOCCUS FAECALIS  Culture, blood (Routine x 2)     Status: None   Collection Time: 07/28/16 11:45 PM  Result Value Ref Range Status   Specimen Description BLOOD LEFT ANTECUBITAL  Final   Special Requests   Final  BOTTLES DRAWN AEROBIC AND ANAEROBIC Blood Culture adequate volume   Culture   Final    NO GROWTH 5 DAYS Performed at [facility], [place] [address] [place], [place] [address]    Report Status 08/03/2016 FINAL  Final  Surgical PCR screen     Status: None   Collection Time: 07/30/16 10:17 PM  Result Value Ref Range Status   MRSA, PCR NEGATIVE NEGATIVE Final   Staphylococcus aureus NEGATIVE NEGATIVE Final  Aerobic/Anaerobic Culture (surgical/deep wound)     Status: None (Preliminary result)   Collection Time: 07/31/16  1:38 PM  Result Value Ref Range Status   Specimen Description TOE RIGHT  Final   Special Requests NONE  Final   Gram Stain   Final    ABUNDANT WBC PRESENT,BOTH PMN AND MONONUCLEAR RARE GRAM POSITIVE COCCI IN PAIRS    Culture   Final    CULTURE REINCUBATED FOR BETTER GROWTH HOLDING FOR POSSIBLE ANAEROBE Performed at [facility], [place] [address] [place], [place] [address]    Report Status PENDING  Incomplete    Radiology Studies: No results found.  Scheduled Meds: . aspirin EC  81 mg Oral Daily  . atorvastatin  40 mg Oral q1800  . enoxaparin (LOVENOX) injection  40 mg Subcutaneous Q24H  . gemfibrozil  600 mg Oral BID AC  . insulin aspart  0-9 Units Subcutaneous TID WC  . multivitamin with minerals  1 tablet Oral BID  . omega-3 acid ethyl esters  1 g Oral Daily  . vitamin B-12  5,000 mcg Oral Daily   Continuous Infusions: . ampicillin-sulbactam (UNASYN) IV Stopped (08/11/16 1158)     LOS: 13 days   Time spent: 25 minutes   Faye Ramsay, MD [facility] Pager [phone]  If 7PM-7AM, please contact  night-coverage [email or web address] Password [facility] 08/11/2016, 11:47 AM

## 2016-08-11 NOTE — Consult Note (Signed)
[place] Nurse wound consult note Reason for Consult: Placement of NPWT per Dr. Berenice Primas request.  He and I have spoken this morning and per his instruction, Mepitel non-adherent is the wound contact layer over exposed tendon, followed by white foam and then black foam over wound, medical adhesive related skin injury and incision line. Wound type: Surgical Pressure Injury POA: N/A Measurement: 13cm incision line well approximated.  Medical adhesive related skin injury (MARSI) to plantar aspect of foot measuring 3cm round. Dorsal wound distal to 4th digit measures 3.5cm x 4cm x 0.2cm with 2cm area of exposed tendon surrounded by beefy red tissue. Wound bed:As described above Drainage (amount, consistency, odor) No frank wound drainage noted today Periwound: intact, dry Dressing procedure/placement/frequency: NPWT initiated per Dr. Jackalyn [place] instructions above.  Order modified for frequency to M/W/F.  An immediate seal is achieved at 41mmHg negative pressure.  Patient tolerated procedure well.  Three pieces of black foam, 1 piece of white foam and 2 pieces of Mepitel applied as wound contact layers beneath drape.  ACE and ABD applied. [place] nursing team will not follow, but will remain available to this patient, the nursing and medical teams.  Please re-consult if needed. Thanks, Maudie Flakes, MSN, RN, [place], Arther Abbott  Pager# [phone]

## 2016-08-11 NOTE — Progress Notes (Signed)
I had further conversation with the patient, his wife, and Dr. Lyndee Leo Dillingham And we all feel that treating him with A-CELL prior to discharge may improve his incremental opportunity to heal somewhat.  She will plan on seeing him later  Today or tomorrow with plans for surgery Wednesday or Thursday.  He will still need home VAC treatment After this surgical intervention and I believe Home IV antibiotics but the duration will be less based on how long he continues in the hospital.  Dorna Leitz CELL#[phone]

## 2016-08-11 NOTE — Progress Notes (Signed)
Nutrition Follow-up  DOCUMENTATION CODES:   Obesity unspecified  INTERVENTION:   - Continue to encourage PO intakes of meals. - RD will continue to monitor for additional needs.   NUTRITION DIAGNOSIS:   Increased nutrient needs related to wound healing as evidenced by estimated needs.  Ongoing.  GOAL:   Patient will meet greater than or equal to 90% of their needs  Progressing.  MONITOR:   PO intake, Labs, Weight trends, Skin, I & O's  ASSESSMENT:   46 y.o. male with medical history significant of hypertension, hyperlipidemia, diabetes mellitus, obesity, CKd-II, who presents with right foot wound infection and drainage. 7/20: s/p RAY AMPUTATION RIGHT FOURTH METATARSAL, DEBRIDEMENT OF SUBQ TISSUE,BONE AND SKIN WITH WOUND CLOSURE (Right)  Patient continues to eat 100% of meals. Per ortho note, pt may need A-CELL prior to discharge either Wed or Thurs. Will continue to monitor for needs.  Medications: Multivitamin with minerals daily, Lovaza capsule daily, vitamin B-12 tablet daily Labs reviewed: CBGs: 114-120  7/16 -Per chart review, pt mainly consuming 100% of meals since previous meals although he has skipped a few meals over the past week d/t lack of appetite.  -Skipped meal is typically at dinner; this is consistent with eating pattern PTA. No new weight since admission. - Pt is POD #4 amputation of R great toe with wound vac in place.   7/10 - Pt states he ate 100% of his breakfast of an omelet. - He has a good appetite but does not eat as consistently as he should d/t his new job. - Pt will eat breakfast and lunch but usually won't eat a dinner meal, more like snacks.  - Encouraged small, consistent meals to help with blood sugar control.  - Encouraged protein consumption with DM foot ulcer.  - Pt states he has tried protein supplements before and has options he prefers at home.  - Does not like Premier Protein or Glucerna which are our options here.  - Per  chart review, pt has lost 54 lbs since 11/22/15, pt states this is intentional as he was participating in a weight loss program with his wife.  - Nutrition focused physical exam shows no sign of depletion of muscle mass or body fat.  Diet Order:  Diet Carb Modified Fluid consistency: Thin; Room service appropriate? Yes  Skin:  Wound (see comment) (DM foot ulcer)  Last BM:  7/17  Height:   Ht Readings from Last 1 Encounters:  07/28/16 6' (1.829 m)    Weight:   Wt Readings from Last 1 Encounters:  07/28/16 262 lb (118.8 kg)    Ideal Body Weight:  80.9 kg  BMI:  Body mass index is 35.53 kg/m.  Estimated Nutritional Needs:   Kcal:  6789-3810  Protein:  105-115g  Fluid:  2.3L/day  EDUCATION NEEDS:   Education needs addressed  [name] Bibles, MS, RD, LDN Pager: [phone] After Hours Pager: [phone]

## 2016-08-11 NOTE — Care Management Note (Signed)
Case Management Note  Patient Details  Name: [name] MRN: [id number] Date of Birth: November 27, 1970  Subjective/Objective:  46 y/o m admitted w/R foot osteomyelitis. From home. Has wound vac. If home NWPT needed or wound vac can arrange with orders.                  Action/Plan:d/c home.   Expected Discharge Date:                  Expected Discharge Plan:  [place]  In-House Referral:     Discharge planning Services  CM Consult  Post Acute Care Choice:    Choice offered to:     DME Arranged:    DME Agency:     HH Arranged:    [place] Agency:     Status of Service:  In process, will continue to follow  If discussed at Long Length of Stay Meetings, dates discussed:    Additional Comments:  Dessa Phi, RN 08/11/2016, 2:31 PM

## 2016-08-12 ENCOUNTER — Ambulatory Visit: Payer: Self-pay | Admitting: Plastic Surgery

## 2016-08-12 DIAGNOSIS — S91104A Unspecified open wound of right lesser toe(s) without damage to nail, initial encounter: Secondary | ICD-10-CM

## 2016-08-12 LAB — CBC
HCT: 25.9 % — ABNORMAL LOW (ref 39.0–52.0)
Hemoglobin: 8.3 g/dL — ABNORMAL LOW (ref 13.0–17.0)
MCH: 24.9 pg — ABNORMAL LOW (ref 26.0–34.0)
MCHC: 32 g/dL (ref 30.0–36.0)
MCV: 77.5 fL — ABNORMAL LOW (ref 78.0–100.0)
Platelets: 452 10*3/uL — ABNORMAL HIGH (ref 150–400)
RBC: 3.34 MIL/uL — ABNORMAL LOW (ref 4.22–5.81)
RDW: 14.3 % (ref 11.5–15.5)
WBC: 9.1 10*3/uL (ref 4.0–10.5)

## 2016-08-12 LAB — BASIC METABOLIC PANEL
Anion gap: 8 (ref 5–15)
BUN: 21 mg/dL — ABNORMAL HIGH (ref 6–20)
CO2: 27 mmol/L (ref 22–32)
Calcium: 9.1 mg/dL (ref 8.9–10.3)
Chloride: 104 mmol/L (ref 101–111)
Creatinine, Ser: 1.22 mg/dL (ref 0.61–1.24)
GFR calc Af Amer: >60 mL/min (ref >60)
GFR calc non Af Amer: >60 mL/min (ref >60)
Glucose, Bld: 127 mg/dL — ABNORMAL HIGH (ref 65–99)
Potassium: 4.2 mmol/L (ref 3.5–5.1)
Sodium: 139 mmol/L (ref 135–145)

## 2016-08-12 LAB — GLUCOSE, CAPILLARY
Glucose-Capillary: 132 mg/dL — ABNORMAL HIGH (ref 65–99)
Glucose-Capillary: 126 mg/dL — ABNORMAL HIGH (ref 65–99)
Glucose-Capillary: 97 mg/dL (ref 65–99)
Glucose-Capillary: 142 mg/dL — ABNORMAL HIGH (ref 65–99)

## 2016-08-12 MED ORDER — CHLORHEXIDINE GLUCONATE CLOTH 2 % EX PADS
6.0000 | MEDICATED_PAD | Freq: Once | CUTANEOUS | Status: AC
  Administered 2016-08-13: 06:00:00 6 via TOPICAL

## 2016-08-12 MED ORDER — CHLORHEXIDINE GLUCONATE CLOTH 2 % EX PADS
6.0000 | MEDICATED_PAD | Freq: Once | CUTANEOUS | Status: AC
  Administered 2016-08-12: 22:00:00 6 via TOPICAL

## 2016-08-12 MED ORDER — CEFAZOLIN SODIUM-DEXTROSE 2-4 GM/100ML-% IV SOLN
2.0000 g | INTRAVENOUS | Status: AC
  Administered 2016-08-13: 2 g via INTRAVENOUS
  Filled 2016-08-12: qty 100

## 2016-08-12 NOTE — Progress Notes (Signed)
Patient ID: [name], male   DOB: 1970/09/09, 46 y.o.   MRN: [id number]    PROGRESS NOTE   [name]  ZJI:[id number] DOB: May 12, 1970 DOA: 07/28/2016  PCP: Heywood Bene, PA-C   Brief Narrative:  Pt is 46 yo male with known DM type II and CKD stage II, presented with right foot infection. XARY on admission worrisome for fifth right foot toe metatarsal osteomyelitis. Patient started on Vanc and Zosyn and TRH asked to admit for further evaluation.    Assessment & Plan:  Right foot osteomyelitis, leukocytosis  would culture positive for Enterococcus Faecalis, sensitive to Vancomycin  - s/p right fifth ray amputation, application of wound vac, post op day #12 - s/p 4th ray amputation, complex closure with placement of wound VAC, post op day #4 - Patient has been on vancomycin and Zosyn for 8 days and based on sensitivity report, antibiotics changed to Unasyn on 08/06/2016 - will keep on Unasyn today and can likely discontinue after pt undergoes treatment with A-CELL which is planned for tomorrow 08/13/2016 - 13 cm incision line clean, dorsal wound distal to 4th digit measures 3.5 cm x 4 cm x 0.2 cm, exposed tendon - per ortho and plastic surgery team, plan for treatment with A-CELL prior to discharge, this is scheduled for tomorrow  - provide analgesia as needed - pt remains afebrile and WBC is WNL  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 Iron deficiency, microcytic - Hg overall stable  - no evidence of active bleeding  - CBC In  AM  Obesity  - Body mass index is 35.53 kg/m.  DVT prophylaxis: Lovenox SQ Code Status: Full  Family Communication:  Pt at bedside Disposition Plan: home when ortho team clears   Consultants:   Ortho  Plastic surgery   Procedures:   7/12 - Dr. Berenice Primas performed right fifth ray amputation, debridement of ths skin, subQ tissue, muscle, fasca at the site of complex wound infection   7/20 - 4th ray amputation on the right foot   Antimicrobials:   Vancomycin 7/10 --> 7/18  Zosyn 7//10 --> 7/17  Unasyn 7/18 -->  Subjective: No concern this AM.   Objective: Vitals:   08/11/16 0531 08/11/16 1327 08/11/16 2203 08/12/16 0626  BP: 121/75 131/82 125/79 132/82  Pulse: 72 79 72 76  Resp: 16 18 18 18   Temp: 98.8 F (37.1 C) 98.8 F (37.1 C) 99 F (37.2 C) 98.8 F (37.1 C)  TempSrc: Oral Oral Oral Oral  SpO2: 100% 99% 100% 99%  Weight:      Height:        Intake/Output Summary (Last 24 hours) at 08/12/16 1226 Last data filed at 08/12/16 1010  Gross per 24 hour  Intake             2000 ml  Output             1925 ml  Net  75 ml   Filed Weights   07/28/16 1831  Weight: 118.8 kg (262 lb)    Physical Exam  Constitutional: Appears well-developed and well-nourished. No distress.  CVS: RRR, S1/S2 +, no murmurs, no gallops, no carotid bruit.  Pulmonary: Effort and breath sounds normal, no stridor, rhonchi, wheezes, rales.  Abdominal: Soft. BS +,  no distension, tenderness, rebound or guarding.  Musculoskeletal: Normal range of motion. Exposed tendon on the dorsal aspect of the foot 3x3 cm with depth of 1 cm Neuro: Alert. Normal reflexes, muscle tone coordination. No cranial nerve deficit.  Data Reviewed: I have personally reviewed following labs and imaging studies  CBC:  Recent Labs Lab 08/08/16 0559 08/09/16 0457 08/10/16 0519 08/11/16 0445 08/12/16 0514  WBC 11.1* 14.2* 12.9* 10.3 9.1  HGB 9.0* 9.0* 8.6* 8.2* 8.3*  HCT 28.2* 28.0* 26.6* 25.6*  25.9*  MCV 76.8* 77.6* 77.6* 76.0* 77.5*  PLT 460* 444* 465* 439* 416*   Basic Metabolic Panel:  Recent Labs Lab 08/08/16 0559 08/09/16 0457 08/10/16 0519 08/11/16 0445 08/12/16 0514  NA 137 138 138 138 139  K 4.4 4.1 3.9 3.9 4.2  CL 104 103 105 103 104  CO2 25 25 24 26 27   GLUCOSE 152* 134* 128* 130* 127*  BUN 23* 20 18 19  21*  CREATININE 1.24 1.18 1.19 1.23 1.22  CALCIUM 9.1 9.0 8.9 9.0 9.1   CBG:  Recent Labs Lab 08/11/16 0741 08/11/16 1325 08/11/16 1832 08/11/16 2206 08/12/16 0722  GLUCAP 120* 107* 106* 121* 132*   Urine analysis:    Component Value Date/Time   COLORURINE AMBER (A) 07/28/2016 1846   APPEARANCEUR CLOUDY (A) 07/28/2016 1846   LABSPEC 1.021 07/28/2016 1846   PHURINE 5.0 07/28/2016 1846   GLUCOSEU NEGATIVE 07/28/2016 1846   HGBUR NEGATIVE 07/28/2016 1846   BILIRUBINUR NEGATIVE 07/28/2016 1846   KETONESUR NEGATIVE 07/28/2016 1846   PROTEINUR 100 (A) 07/28/2016 1846   UROBILINOGEN 0.2 01/26/2007 1759   NITRITE NEGATIVE 07/28/2016 1846   LEUKOCYTESUR NEGATIVE 07/28/2016 1846   Recent Results (from the past 240 hour(s))  Culture, blood (Routine x 2)     Status: None   Collection Time: 07/28/16 11:06 PM  Result Value Ref Range Status   Specimen Description BLOOD BLOOD RIGHT HAND  Final   Special Requests   Final    BOTTLES DRAWN AEROBIC AND ANAEROBIC Blood Culture results may not be optimal due to an inadequate volume of blood received in culture bottles   Culture   Final    NO GROWTH 5 DAYS Performed at [facility], [place] [address], [place], [place] [address]    Report Status 08/03/2016 FINAL  Final  Wound or Superficial Culture     Status: None   Collection Time: 07/28/16 11:06 PM  Result Value Ref Range Status   Specimen Description FOOT RIGHT  Final   Special Requests NONE  Final   Gram Stain   Final    NO WBC SEEN FEW GRAM POSITIVE COCCI FEW GRAM NEGATIVE RODS    Culture   Final    MODERATE ENTEROCOCCUS FAECALIS WITHIN  MIXED ORGANISMS Performed at [facility], [place] [address], [place], [place] [address]    Report Status 08/01/2016 FINAL  Final   Organism ID, Bacteria ENTEROCOCCUS FAECALIS  Final      Susceptibility   Enterococcus faecalis - MIC*    AMPICILLIN <=2 SENSITIVE Sensitive     VANCOMYCIN 1 SENSITIVE Sensitive     GENTAMICIN SYNERGY SENSITIVE  Sensitive     * MODERATE ENTEROCOCCUS FAECALIS  Culture, blood (Routine x 2)     Status: None   Collection Time: 07/28/16 11:45 PM  Result Value Ref Range Status   Specimen Description BLOOD LEFT ANTECUBITAL  Final   Special Requests   Final    BOTTLES DRAWN AEROBIC AND ANAEROBIC Blood Culture adequate volume   Culture   Final    NO GROWTH 5 DAYS Performed at [facility], [address] [place], [place] [address]    Report Status 08/03/2016 FINAL  Final  Surgical PCR screen     Status: None   Collection Time: 07/30/16 10:17 PM  Result Value Ref Range Status   MRSA, PCR NEGATIVE NEGATIVE Final   Staphylococcus aureus NEGATIVE NEGATIVE Final  Aerobic/Anaerobic Culture (surgical/deep wound)     Status: None (Preliminary result)   Collection Time: 07/31/16  1:38 PM  Result Value Ref Range Status   Specimen Description TOE RIGHT  Final   Special Requests NONE  Final   Gram Stain   Final    ABUNDANT WBC PRESENT,BOTH PMN AND MONONUCLEAR RARE GRAM POSITIVE COCCI IN PAIRS    Culture   Final    CULTURE REINCUBATED FOR BETTER GROWTH HOLDING FOR POSSIBLE ANAEROBE Performed at [facility], [place] [address], [place], [place] [address]    Report Status PENDING  Incomplete    Radiology Studies: No results found.  Scheduled Meds: . aspirin EC  81 mg Oral Daily  . atorvastatin  40 mg Oral q1800  . enoxaparin (LOVENOX) injection  40 mg Subcutaneous Q24H  . gemfibrozil  600 mg Oral BID AC  . insulin aspart  0-9 Units Subcutaneous TID WC  . multivitamin with minerals  1 tablet Oral BID  . omega-3 acid ethyl esters  1 g Oral Daily  .  vitamin B-12  5,000 mcg Oral Daily   Continuous Infusions: . ampicillin-sulbactam ([facility]NA[facility]N) IV 3 g (08/12/16 0931)     LOS: 14 days   Time spent: 25 minutes  Faye Ramsay, MD [facility] Pager [phone]  If 7PM-7AM, please contact night-coverage [email or web address] Password [facility] 08/12/2016, 12:26 PM

## 2016-08-12 NOTE — Anesthesia Preprocedure Evaluation (Signed)
Anesthesia Evaluation  Patient identified by MRN, date of birth, ID band Patient awake    Reviewed: Allergy & Precautions, NPO status , Patient's Chart, lab work & pertinent test results  Airway Mallampati: II  TM Distance: >3 FB Neck ROM: Full    Dental no notable dental hx.    Pulmonary neg pulmonary ROS,    Pulmonary exam normal breath sounds clear to auscultation       Cardiovascular hypertension, Pt. on medications negative cardio ROS Normal cardiovascular exam Rhythm:Regular Rate:Normal     Neuro/Psych negative neurological ROS  negative psych ROS   GI/Hepatic negative GI ROS, Neg liver ROS,   Endo/Other  negative endocrine ROSdiabetes, Type 2  Renal/GU negative Renal ROS  negative genitourinary   Musculoskeletal negative musculoskeletal ROS (+)   Abdominal   Peds negative pediatric ROS (+)  Hematology negative hematology ROS (+)   Anesthesia Other Findings   Reproductive/Obstetrics negative OB ROS                             Anesthesia Physical  Anesthesia Plan  ASA: III  Anesthesia Plan: General   Post-op Pain Management:    Induction: Intravenous  PONV Risk Score and Plan: 2 and Ondansetron and Dexamethasone  Airway Management Planned: LMA  Additional Equipment:   Intra-op Plan:   Post-operative Plan: Extubation in OR  Informed Consent: I have reviewed the patients History and Physical, chart, labs and discussed the procedure including the risks, benefits and alternatives for the proposed anesthesia with the patient or authorized representative who has indicated his/her understanding and acceptance.   Dental advisory given  Plan Discussed with: CRNA  Anesthesia Plan Comments:         Anesthesia Quick Evaluation

## 2016-08-12 NOTE — Care Management Note (Signed)
Case Management Note  Patient Details  Name: [name] MRN: [id number] Date of Birth: 12-28-1970  Subjective/Objective:  45 y/o m admitted w/Diabetic R foot infection. Plastics Surgery following-another surgery in am-graft. Please indicate which home wound vac needed:[facility]-wound vac or [facility] NWPT. HHRN needed 2 d/c Please put in [facility] order,[id number]. Thanks.                  Action/Plan:d/c plan home w/HHC/wound vac.   Expected Discharge Date:                  Expected Discharge Plan:  [place]  In-House Referral:     Discharge planning Services  CM Consult  Post Acute Care Choice:    Choice offered to:     DME Arranged:    DME Agency:     HH Arranged:    [place] Agency:     Status of Service:  In process, will continue to follow  If discussed at Long Length of Stay Meetings, dates discussed:    Additional Comments:  Dessa Phi, RN 08/12/2016, 11:43 AM

## 2016-08-12 NOTE — Consult Note (Signed)
Reason for Consult:Open wound of right foot with exposed tendon Referring Physician: Dr. Dorna Leitz, orthopedics Inpatient Consult, [facility] 08/11/16  [name] is an 46 y.o. male.  HPI: [name] is a 46 yo male who was admitted with worsening infection of his right foot. He reports he developed a wound over the foot about 1 month ago and was treated with oral antibiotics and wound care, but worsened and he developed dizziness, nausea and vomiting and presented to the ED for further care. He was found to have WBC of 22.7 and X ray showed worsening of soft tissue ulcer and air about the 5 th MTP joint with suspicion for osteomyelitis and he was admitted. He was evaluated by Dr. Berenice Primas and taken to the OR 7/12 for I&D and right 5th ray amputation.  He returned to the OR 7/20 for repeat I&D, amputation of the 4th ray and complex partial closure of the area with noted exposed tendon and fascia present at this time.  We are consulted for management of this complex residual wound.  He has multiple chronic medical problems as noted below.   Past Medical History:  Diagnosis Date  . CKD (chronic kidney disease), stage II   . Diabetes mellitus without complication ([place])   . Hyperlipidemia   . Hypertension   . Obesity     Past Surgical History:  Procedure Laterality Date  . AMPUTATION Right 07/31/2016   Procedure: AMPUTATION RIGHT FIFTH RAY AND APPLICATION OF WOUND VAC;  Surgeon: Dorna Leitz, MD;  Location: [facility];  Service: Orthopedics;  Laterality: Right;  . AMPUTATION Right 08/08/2016   Procedure: RAY AMPUTATION RIGHT FOURTH METATARSAL, DEBRIDEMENT OF SUBQ TISSUE,BONE AND SKIN WITH WOUND CLOSURE;  Surgeon: Dorna Leitz, MD;  Location: [facility];  Service: Orthopedics;  Laterality: Right;  . ANAL FISSURE REPAIR  2000  . CHOLECYSTECTOMY    . Circumcisum  2006  . wisdom teeth extracted  1984    Family History  Problem Relation Age of Onset  . Diabetes Father     Social  History:  reports that he has never smoked. He has never used smokeless tobacco. He reports that he does not drink alcohol. His drug history is not on file.  Allergies: No Known Allergies  Medications: I have reviewed the patient's current medications.  Results for orders placed or performed during the hospital encounter of 07/28/16 (from the past 48 hour(s))  Glucose, capillary     Status: Abnormal   Collection Time: 08/10/16 11:44 AM  Result Value Ref Range   Glucose-Capillary 122 (H) 65 - 99 mg/dL  Glucose, capillary     Status: Abnormal   Collection Time: 08/10/16  5:01 PM  Result Value Ref Range   Glucose-Capillary 134 (H) 65 - 99 mg/dL  Glucose, capillary     Status: Abnormal   Collection Time: 08/10/16 10:01 PM  Result Value Ref Range   Glucose-Capillary 114 (H) 65 - 99 mg/dL  CBC     Status: Abnormal   Collection Time: 08/11/16  4:45 AM  Result Value Ref Range   WBC 10.3 4.0 - 10.5 K/uL   RBC 3.37 (L) 4.22 - 5.81 MIL/uL   Hemoglobin 8.2 (L) 13.0 - 17.0 g/dL   HCT 25.6 (L) 39.0 - 52.0 %   MCV 76.0 (L) 78.0 - 100.0 fL   MCH 24.3 (L) 26.0 - 34.0 pg   MCHC 32.0 30.0 - 36.0 g/dL   RDW 14.3 11.5 - 15.5 %  Platelets 439 (H) 150 - 400 K/uL  Basic metabolic panel     Status: Abnormal   Collection Time: 08/11/16  4:45 AM  Result Value Ref Range   Sodium 138 135 - 145 mmol/L   Potassium 3.9 3.5 - 5.1 mmol/L   Chloride 103 101 - 111 mmol/L   CO2 26 22 - 32 mmol/L   Glucose, Bld 130 (H) 65 - 99 mg/dL   BUN 19 6 - 20 mg/dL   Creatinine, Ser 1.23 0.61 - 1.24 mg/dL   Calcium 9.0 8.9 - 10.3 mg/dL   GFR calc non Af Amer >60 >60 mL/min   GFR calc Af Amer >60 >60 mL/min    Comment: (NOTE) The eGFR has been calculated using the CKD EPI equation. This calculation has not been validated in all clinical situations. eGFR's persistently <60 mL/min signify possible Chronic Kidney Disease.    Anion gap 9 5 - 15  Glucose, capillary     Status: Abnormal   Collection Time: 08/11/16   7:41 AM  Result Value Ref Range   Glucose-Capillary 120 (H) 65 - 99 mg/dL  Glucose, capillary     Status: Abnormal   Collection Time: 08/11/16  1:25 PM  Result Value Ref Range   Glucose-Capillary 107 (H) 65 - 99 mg/dL  Glucose, capillary     Status: Abnormal   Collection Time: 08/11/16  6:32 PM  Result Value Ref Range   Glucose-Capillary 106 (H) 65 - 99 mg/dL  Glucose, capillary     Status: Abnormal   Collection Time: 08/11/16 10:06 PM  Result Value Ref Range   Glucose-Capillary 121 (H) 65 - 99 mg/dL  CBC     Status: Abnormal   Collection Time: 08/12/16  5:14 AM  Result Value Ref Range   WBC 9.1 4.0 - 10.5 K/uL   RBC 3.34 (L) 4.22 - 5.81 MIL/uL   Hemoglobin 8.3 (L) 13.0 - 17.0 g/dL   HCT 25.9 (L) 39.0 - 52.0 %   MCV 77.5 (L) 78.0 - 100.0 fL   MCH 24.9 (L) 26.0 - 34.0 pg   MCHC 32.0 30.0 - 36.0 g/dL   RDW 14.3 11.5 - 15.5 %   Platelets 452 (H) 150 - 400 K/uL  Glucose, capillary     Status: Abnormal   Collection Time: 08/12/16  7:22 AM  Result Value Ref Range   Glucose-Capillary 132 (H) 65 - 99 mg/dL    No results found.  Review of Systems  Constitutional: Positive for chills, fever and malaise/fatigue.  Musculoskeletal: Positive for joint pain.   Blood pressure 132/82, pulse 76, temperature 98.8 F (37.1 C), temperature source Oral, resp. rate 18, height 6' (1.829 m), weight 118.8 kg (262 lb), SpO2 99 %. Physical Exam  Constitutional: He appears well-developed and well-nourished. No distress.  HENT:  Head: Normocephalic and atraumatic.  Cardiovascular: Normal rate and regular rhythm.   Respiratory: Effort normal. No respiratory distress.  Skin:  Right foot with VAC dressing in place and functioning well. Minimal edema over the area.     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  Will need HHC for  RN for [facility] dressing changes and Home VAC.   Roczen Waymire,PA-C Plastic Surgery [phone]

## 2016-08-13 ENCOUNTER — Inpatient Hospital Stay (HOSPITAL_COMMUNITY): Payer: BLUE CROSS/BLUE SHIELD | Admitting: Anesthesiology

## 2016-08-13 ENCOUNTER — Encounter (HOSPITAL_COMMUNITY)
Admission: EM | Disposition: A | Discharge status: Home Health | Payer: Self-pay | Source: Home / Self Care | Attending: Internal Medicine

## 2016-08-13 ENCOUNTER — Encounter (HOSPITAL_COMMUNITY): Payer: Self-pay | Admitting: Plastic Surgery

## 2016-08-13 DIAGNOSIS — D508 Other iron deficiency anemias: Secondary | ICD-10-CM

## 2016-08-13 HISTORY — PX: APPLICATION OF A-CELL OF EXTREMITY: SHX6303

## 2016-08-13 HISTORY — PX: INCISION AND DRAINAGE OF WOUND: SHX1803

## 2016-08-13 LAB — CBC
HCT: 26.6 % — ABNORMAL LOW (ref 39.0–52.0)
Hemoglobin: 8.6 g/dL — ABNORMAL LOW (ref 13.0–17.0)
MCH: 24.6 pg — ABNORMAL LOW (ref 26.0–34.0)
MCHC: 32.3 g/dL (ref 30.0–36.0)
MCV: 76.2 fL — ABNORMAL LOW (ref 78.0–100.0)
Platelets: 480 10*3/uL — ABNORMAL HIGH (ref 150–400)
RBC: 3.49 MIL/uL — ABNORMAL LOW (ref 4.22–5.81)
RDW: 14.2 % (ref 11.5–15.5)
WBC: 7.6 10*3/uL (ref 4.0–10.5)

## 2016-08-13 LAB — BASIC METABOLIC PANEL
Anion gap: 6 (ref 5–15)
BUN: 20 mg/dL (ref 6–20)
CO2: 28 mmol/L (ref 22–32)
Calcium: 9.2 mg/dL (ref 8.9–10.3)
Chloride: 105 mmol/L (ref 101–111)
Creatinine, Ser: 1.21 mg/dL (ref 0.61–1.24)
GFR calc Af Amer: >60 mL/min (ref >60)
GFR calc non Af Amer: >60 mL/min (ref >60)
Glucose, Bld: 133 mg/dL — ABNORMAL HIGH (ref 65–99)
Potassium: 4 mmol/L (ref 3.5–5.1)
Sodium: 139 mmol/L (ref 135–145)

## 2016-08-13 LAB — GLUCOSE, CAPILLARY
Glucose-Capillary: 130 mg/dL — ABNORMAL HIGH (ref 65–99)
Glucose-Capillary: 130 mg/dL — ABNORMAL HIGH (ref 65–99)
Glucose-Capillary: 134 mg/dL — ABNORMAL HIGH (ref 65–99)
Glucose-Capillary: 117 mg/dL — ABNORMAL HIGH (ref 65–99)
Glucose-Capillary: 111 mg/dL — ABNORMAL HIGH (ref 65–99)

## 2016-08-13 LAB — HEPATIC FUNCTION PANEL
ALT: 22 U/L (ref 17–63)
AST: 17 U/L (ref 15–41)
Albumin: 2.5 g/dL — ABNORMAL LOW (ref 3.5–5.0)
Alkaline Phosphatase: 143 U/L — ABNORMAL HIGH (ref 38–126)
Bilirubin, Direct: <0.1 mg/dL — ABNORMAL LOW (ref 0.1–0.5)
Total Bilirubin: 0.5 mg/dL (ref 0.3–1.2)
Total Protein: 7.8 g/dL (ref 6.5–8.1)

## 2016-08-13 SURGERY — IRRIGATION AND DEBRIDEMENT WOUND
Anesthesia: General | Laterality: Right

## 2016-08-13 MED ORDER — INSULIN ASPART 100 UNIT/ML ~~LOC~~ SOLN: 0.0000 [IU] | Freq: Three times a day (TID) | SUBCUTANEOUS | Status: DC

## 2016-08-13 MED ORDER — 0.9 % SODIUM CHLORIDE (POUR BTL) OPTIME
TOPICAL | Status: DC | PRN
  Administered 2016-08-13: 1000 mL

## 2016-08-13 MED ORDER — PROPOFOL 10 MG/ML IV BOLUS
INTRAVENOUS | Status: AC
  Filled 2016-08-13: qty 20

## 2016-08-13 MED ORDER — FERROUS SULFATE 325 (65 FE) MG PO TABS
325.0000 mg | ORAL_TABLET | Freq: Two times a day (BID) | ORAL | Status: DC
  Administered 2016-08-13 – 2016-08-14 (×2): 325 mg via ORAL
  Filled 2016-08-13 (×2): qty 1

## 2016-08-13 MED ORDER — ONDANSETRON HCL 4 MG/2ML IJ SOLN
INTRAMUSCULAR | Status: AC
  Filled 2016-08-13: qty 2

## 2016-08-13 MED ORDER — MIDAZOLAM HCL 2 MG/2ML IJ SOLN
INTRAMUSCULAR | Status: AC
  Filled 2016-08-13: qty 2

## 2016-08-13 MED ORDER — BUPIVACAINE-EPINEPHRINE (PF) 0.25% -1:200000 IJ SOLN
INTRAMUSCULAR | Status: AC
  Filled 2016-08-13: qty 30

## 2016-08-13 MED ORDER — LIDOCAINE 2% (20 MG/ML) 5 ML SYRINGE
INTRAMUSCULAR | Status: DC | PRN
  Administered 2016-08-13: 100 mg via INTRAVENOUS

## 2016-08-13 MED ORDER — SODIUM CHLORIDE 0.9 % IV SOLN
3.0000 g | Freq: Four times a day (QID) | INTRAVENOUS | Status: DC
  Administered 2016-08-13 – 2016-08-14 (×5): 3 g via INTRAVENOUS
  Filled 2016-08-13 (×8): qty 3

## 2016-08-13 MED ORDER — MEPERIDINE HCL 50 MG/ML IJ SOLN: 6.2500 mg | INTRAMUSCULAR | Status: DC | PRN

## 2016-08-13 MED ORDER — FENTANYL CITRATE (PF) 100 MCG/2ML IJ SOLN
INTRAMUSCULAR | Status: DC | PRN
  Administered 2016-08-13: 25 ug via INTRAVENOUS

## 2016-08-13 MED ORDER — GLUCERNA SHAKE PO LIQD
237.0000 mL | Freq: Two times a day (BID) | ORAL | Status: DC
  Administered 2016-08-14: 237 mL via ORAL
  Filled 2016-08-13 (×2): qty 237

## 2016-08-13 MED ORDER — DOXYCYCLINE HYCLATE 100 MG PO TABS
100.0000 mg | ORAL_TABLET | Freq: Two times a day (BID) | ORAL | Status: DC
Start: 2016-08-13 — End: 2016-08-13
  Filled 2016-08-13: qty 1

## 2016-08-13 MED ORDER — ONDANSETRON HCL 4 MG/2ML IJ SOLN
4.0000 mg | Freq: Once | INTRAMUSCULAR | Status: DC | PRN
Start: 2016-08-13 — End: 2016-08-13

## 2016-08-13 MED ORDER — PROPOFOL 10 MG/ML IV BOLUS
INTRAVENOUS | Status: DC | PRN
  Administered 2016-08-13: 200 mg via INTRAVENOUS

## 2016-08-13 MED ORDER — VITAMIN C 500 MG PO TABS
500.0000 mg | ORAL_TABLET | Freq: Every day | ORAL | Status: DC
  Administered 2016-08-13 – 2016-08-14 (×2): 500 mg via ORAL
  Filled 2016-08-13 (×2): qty 1

## 2016-08-13 MED ORDER — MIDAZOLAM HCL 2 MG/2ML IJ SOLN
INTRAMUSCULAR | Status: DC | PRN
  Administered 2016-08-13: 2 mg via INTRAVENOUS

## 2016-08-13 MED ORDER — LACTATED RINGERS IV SOLN
INTRAVENOUS | Status: DC | PRN
  Administered 2016-08-13: 07:00:00 via INTRAVENOUS

## 2016-08-13 MED ORDER — LACTATED RINGERS IV SOLN: INTRAVENOUS | Status: DC

## 2016-08-13 MED ORDER — FENTANYL CITRATE (PF) 100 MCG/2ML IJ SOLN: 25.0000 ug | INTRAMUSCULAR | Status: DC | PRN

## 2016-08-13 MED ORDER — INSULIN GLARGINE 100 UNIT/ML ~~LOC~~ SOLN
20.0000 [IU] | Freq: Every day | SUBCUTANEOUS | Status: DC
  Filled 2016-08-13 (×2): qty 0.2

## 2016-08-13 MED ORDER — ADULT MULTIVITAMIN W/MINERALS CH
1.0000 | ORAL_TABLET | Freq: Every day | ORAL | Status: DC
  Administered 2016-08-13 – 2016-08-14 (×2): 1 via ORAL
  Filled 2016-08-13 (×2): qty 1

## 2016-08-13 MED ORDER — FENTANYL CITRATE (PF) 100 MCG/2ML IJ SOLN
INTRAMUSCULAR | Status: AC
  Filled 2016-08-13: qty 2

## 2016-08-13 MED ORDER — SODIUM CHLORIDE 0.9 % IV SOLN
INTRAVENOUS | Status: DC | PRN
  Administered 2016-08-13: 500 mL

## 2016-08-13 MED ORDER — LIDOCAINE 2% (20 MG/ML) 5 ML SYRINGE
INTRAMUSCULAR | Status: AC
  Filled 2016-08-13: qty 5

## 2016-08-13 MED ORDER — SODIUM CHLORIDE 0.9 % IV SOLN
INTRAVENOUS | Status: AC
  Filled 2016-08-13: qty 500000

## 2016-08-13 SURGICAL SUPPLY — 38 items
BANDAGE ACE 4X5 VEL STRL LF (GAUZE/BANDAGES/DRESSINGS) ×3 IMPLANT
BNDG ADH 5X4 AIR PERM ELC (GAUZE/BANDAGES/DRESSINGS) ×1
BNDG COHESIVE 4X5 WHT NS (GAUZE/BANDAGES/DRESSINGS) ×2 IMPLANT
BNDG GAUZE ELAST 4 BULKY (GAUZE/BANDAGES/DRESSINGS) ×2 IMPLANT
CONT SPEC 4OZ CLIKSEAL STRL BL (MISCELLANEOUS) ×3 IMPLANT
DECANTER SPIKE VIAL GLASS SM (MISCELLANEOUS) IMPLANT
DRAPE INCISE IOBAN 66X45 STRL (DRAPES) ×3 IMPLANT
DRAPE LAPAROTOMY T 102X78X121 (DRAPES) ×3 IMPLANT
DRAPE UTILITY XL STRL (DRAPES) ×3 IMPLANT
DRESSING DUODERM 4X4 STERILE (GAUZE/BANDAGES/DRESSINGS) ×2 IMPLANT
DRSG ADAPTIC 3X8 NADH LF (GAUZE/BANDAGES/DRESSINGS) ×3 IMPLANT
DRSG PAD ABDOMINAL 8X10 ST (GAUZE/BANDAGES/DRESSINGS) IMPLANT
DRSG VAC ATS MED SENSATRAC (GAUZE/BANDAGES/DRESSINGS) ×2 IMPLANT
ELECT REM PT RETURN 15FT ADLT (MISCELLANEOUS) ×3 IMPLANT
GAUZE SPONGE 4X4 12PLY STRL (GAUZE/BANDAGES/DRESSINGS) ×3 IMPLANT
GLOVE BIO SURGEON STRL SZ 6.5 (GLOVE) ×2 IMPLANT
GLOVE BIO SURGEONS STRL SZ 6.5 (GLOVE) ×1
GOWN STRL REUS W/TWL LRG LVL3 (GOWN DISPOSABLE) ×6 IMPLANT
HANDPIECE INTERPULSE COAX TIP (DISPOSABLE)
KIT BASIN OR (CUSTOM PROCEDURE TRAY) ×3 IMPLANT
MATRIX WOUND 3-LAYER 7X10 (Tissue) ×1 IMPLANT
MICROMATRIX 1000MG (Tissue) ×3 IMPLANT
NEEDLE HYPO 22GX1.5 SAFETY (NEEDLE) IMPLANT
PACK GENERAL/GYN (CUSTOM PROCEDURE TRAY) ×3 IMPLANT
PAD CAST 4YDX4 CTTN HI CHSV (CAST SUPPLIES) ×1 IMPLANT
PADDING CAST COTTON 4X4 STRL (CAST SUPPLIES) ×3
SET HNDPC FAN SPRY TIP SCT (DISPOSABLE) IMPLANT
SOL PREP PROV IODINE SCRUB 4OZ (MISCELLANEOUS) ×3 IMPLANT
SOLUTION PARTIC MCRMTRX 1000MG (Tissue) IMPLANT
STAPLER VISISTAT 35W (STAPLE) ×3 IMPLANT
SUT MNCRL AB 4-0 PS2 18 (SUTURE) ×3 IMPLANT
SUT SILK 4 0 PS 2 (SUTURE) IMPLANT
SUT VIC AB 5-0 PS2 18 (SUTURE) ×4 IMPLANT
SWAB COLLECTION DEVICE MRSA (MISCELLANEOUS) IMPLANT
SWAB CULTURE ESWAB REG 1ML (MISCELLANEOUS) ×3 IMPLANT
SYR CONTROL 10ML LL (SYRINGE) IMPLANT
TOWEL OR 17X26 10 PK STRL BLUE (TOWEL DISPOSABLE) ×3 IMPLANT
WOUND MATRIX 3-LAYER 7X10 (Tissue) ×1 IMPLANT

## 2016-08-13 NOTE — H&P (View-Only) (Signed)
Reason for Consult:Open wound of right foot with exposed tendon Referring Physician: Dr. Dorna Leitz, orthopedics Inpatient Consult, [facility] 08/11/16  [name] is an 46 y.o. male.  HPI: [name] is a 46 yo male who was admitted with worsening infection of his right foot. He reports he developed a wound over the foot about 1 month ago and was treated with oral antibiotics and wound care, but worsened and he developed dizziness, nausea and vomiting and presented to the ED for further care. He was found to have WBC of 22.7 and X ray showed worsening of soft tissue ulcer and air about the 5 th MTP joint with suspicion for osteomyelitis and he was admitted. He was evaluated by Dr. Berenice Primas and taken to the OR 7/12 for I&D and right 5th ray amputation.  He returned to the OR 7/20 for repeat I&D, amputation of the 4th ray and complex partial closure of the area with noted exposed tendon and fascia present at this time.  We are consulted for management of this complex residual wound.  He has multiple chronic medical problems as noted below.   Past Medical History:  Diagnosis Date  . CKD (chronic kidney disease), stage II   . Diabetes mellitus without complication ([place])   . Hyperlipidemia   . Hypertension   . Obesity     Past Surgical History:  Procedure Laterality Date  . AMPUTATION Right 07/31/2016   Procedure: AMPUTATION RIGHT FIFTH RAY AND APPLICATION OF WOUND VAC;  Surgeon: Dorna Leitz, MD;  Location: [facility];  Service: Orthopedics;  Laterality: Right;  . AMPUTATION Right 08/08/2016   Procedure: RAY AMPUTATION RIGHT FOURTH METATARSAL, DEBRIDEMENT OF SUBQ TISSUE,BONE AND SKIN WITH WOUND CLOSURE;  Surgeon: Dorna Leitz, MD;  Location: [facility];  Service: Orthopedics;  Laterality: Right;  . ANAL FISSURE REPAIR  2000  . CHOLECYSTECTOMY    . Circumcisum  2006  . wisdom teeth extracted  1984    Family History  Problem Relation Age of Onset  . Diabetes Father     Social  History:  reports that he has never smoked. He has never used smokeless tobacco. He reports that he does not drink alcohol. His drug history is not on file.  Allergies: No Known Allergies  Medications: I have reviewed the patient's current medications.  Results for orders placed or performed during the hospital encounter of 07/28/16 (from the past 48 hour(s))  Glucose, capillary     Status: Abnormal   Collection Time: 08/10/16 11:44 AM  Result Value Ref Range   Glucose-Capillary 122 (H) 65 - 99 mg/dL  Glucose, capillary     Status: Abnormal   Collection Time: 08/10/16  5:01 PM  Result Value Ref Range   Glucose-Capillary 134 (H) 65 - 99 mg/dL  Glucose, capillary     Status: Abnormal   Collection Time: 08/10/16 10:01 PM  Result Value Ref Range   Glucose-Capillary 114 (H) 65 - 99 mg/dL  CBC     Status: Abnormal   Collection Time: 08/11/16  4:45 AM  Result Value Ref Range   WBC 10.3 4.0 - 10.5 K/uL   RBC 3.37 (L) 4.22 - 5.81 MIL/uL   Hemoglobin 8.2 (L) 13.0 - 17.0 g/dL   HCT 25.6 (L) 39.0 - 52.0 %   MCV 76.0 (L) 78.0 - 100.0 fL   MCH 24.3 (L) 26.0 - 34.0 pg   MCHC 32.0 30.0 - 36.0 g/dL   RDW 14.3 11.5 - 15.5 %  Platelets 439 (H) 150 - 400 K/uL  Basic metabolic panel     Status: Abnormal   Collection Time: 08/11/16  4:45 AM  Result Value Ref Range   Sodium 138 135 - 145 mmol/L   Potassium 3.9 3.5 - 5.1 mmol/L   Chloride 103 101 - 111 mmol/L   CO2 26 22 - 32 mmol/L   Glucose, Bld 130 (H) 65 - 99 mg/dL   BUN 19 6 - 20 mg/dL   Creatinine, Ser 1.23 0.61 - 1.24 mg/dL   Calcium 9.0 8.9 - 10.3 mg/dL   GFR calc non Af Amer >60 >60 mL/min   GFR calc Af Amer >60 >60 mL/min    Comment: (NOTE) The eGFR has been calculated using the CKD EPI equation. This calculation has not been validated in all clinical situations. eGFR's persistently <60 mL/min signify possible Chronic Kidney Disease.    Anion gap 9 5 - 15  Glucose, capillary     Status: Abnormal   Collection Time: 08/11/16   7:41 AM  Result Value Ref Range   Glucose-Capillary 120 (H) 65 - 99 mg/dL  Glucose, capillary     Status: Abnormal   Collection Time: 08/11/16  1:25 PM  Result Value Ref Range   Glucose-Capillary 107 (H) 65 - 99 mg/dL  Glucose, capillary     Status: Abnormal   Collection Time: 08/11/16  6:32 PM  Result Value Ref Range   Glucose-Capillary 106 (H) 65 - 99 mg/dL  Glucose, capillary     Status: Abnormal   Collection Time: 08/11/16 10:06 PM  Result Value Ref Range   Glucose-Capillary 121 (H) 65 - 99 mg/dL  CBC     Status: Abnormal   Collection Time: 08/12/16  5:14 AM  Result Value Ref Range   WBC 9.1 4.0 - 10.5 K/uL   RBC 3.34 (L) 4.22 - 5.81 MIL/uL   Hemoglobin 8.3 (L) 13.0 - 17.0 g/dL   HCT 25.9 (L) 39.0 - 52.0 %   MCV 77.5 (L) 78.0 - 100.0 fL   MCH 24.9 (L) 26.0 - 34.0 pg   MCHC 32.0 30.0 - 36.0 g/dL   RDW 14.3 11.5 - 15.5 %   Platelets 452 (H) 150 - 400 K/uL  Glucose, capillary     Status: Abnormal   Collection Time: 08/12/16  7:22 AM  Result Value Ref Range   Glucose-Capillary 132 (H) 65 - 99 mg/dL    No results found.  Review of Systems  Constitutional: Positive for chills, fever and malaise/fatigue.  Musculoskeletal: Positive for joint pain.   Blood pressure 132/82, pulse 76, temperature 98.8 F (37.1 C), temperature source Oral, resp. rate 18, height 6' (1.829 m), weight 118.8 kg (262 lb), SpO2 99 %. Physical Exam  Constitutional: He appears well-developed and well-nourished. No distress.  HENT:  Head: Normocephalic and atraumatic.  Cardiovascular: Normal rate and regular rhythm.   Respiratory: Effort normal. No respiratory distress.  Skin:  Right foot with VAC dressing in place and functioning well. Minimal edema over the area.     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  Will need HHC for  RN for [facility] dressing changes and Home VAC.   Delsie Amador,PA-C Plastic Surgery [phone]

## 2016-08-13 NOTE — Progress Notes (Signed)
PHARMACY CONSULT NOTE FOR:  [facility]  OUTPATIENT  PARENTERAL ANTIBIOTIC THERAPY (OPAT)  Indication: Diabetic foot ulcer with osteomyelitis Regimen: 3 grams IV q6h End date: 08/20/16 (then transition to oral doxycycline x 1 week)  IV antibiotic discharge orders are pended. To discharging provider:  please sign these orders via discharge navigator,  Select New Orders & click on the button choice - Manage This Unsigned Work.     Thank you for allowing pharmacy to be a part of this patient's care.  Clayburn Pert, PharmD, BCPS Pager: [phone] 08/13/2016  3:52 PM

## 2016-08-13 NOTE — Op Note (Addendum)
DATE OF OPERATION: 08/13/2016  LOCATION: [facility] Inpatient  PREOPERATIVE DIAGNOSIS: Right diabetic foot wound 6 x 10 cm  POSTOPERATIVE DIAGNOSIS: Same  PROCEDURE: Excisional debridement of right foot skin and soft tissue 6 x 10 cm with placement of Acell (powder 1 gm and 7 x 10 cm sheet).  Placement of the negative pressure wound therapy.  SURGEON: Adiel Mcnamara Sanger Adylynn Hertenstein, DO  ASSISTANT: Shawn Rayburn, PA  EBL: minimal cc  CONDITION: Stable  COMPLICATIONS: None  INDICATION: The patient, [name], is a 46 y.o. male born on August 16, 1970, is here for treatment of diabetic foot ulcer with opening of the amputation site.   PROCEDURE DETAILS:  The patient was seen prior to surgery and marked.  The IV antibiotics were given. The patient was taken to the operating room and given a general anesthetic. A standard time out was performed and all information was confirmed by those in the room. SCD was placed on the left leg.   The right foot was prepped and draped in the usual sterile fashion. The right foot was irrigated with antibiotic solution and saline.  The 6 x 10 cm wound was excised with the #10 blade and scissors from nonviable skin and soft tissue.  Hemostasis was achieved with electrocautery.  All of the Acell powder and sheet was applied to the wound. The Acell was secured with 5-0 Vicryl. The Adaptic was applied with KY gel and the VAC.  There was an excellent seal.  The leg was wrapped with kerlex and an ace wrap.   The patient was allowed to wake up and taken to recovery room in stable condition at the end of the case. The family was notified at the end of the case.

## 2016-08-13 NOTE — Interval H&P Note (Signed)
History and Physical Interval Note:  08/13/2016 6:20 AM  [name]  has presented today for surgery, with the diagnosis of RIGHT FOOT WOUND  The various methods of treatment have been discussed with the patient and family. After consideration of risks, benefits and other options for treatment, the patient has consented to  Procedure(s): IRRIGATION AND DEBRIDEMENT WOUND RIGHT FOOT (Right) APPLICATION OF A-CELL AND VAC (Right) as a surgical intervention .  The patient's history has been reviewed, patient examined, no change in status, stable for surgery.  I have reviewed the patient's chart and labs.  Questions were answered to the patient's satisfaction.     Wallace Going

## 2016-08-13 NOTE — Anesthesia Procedure Notes (Signed)
Procedure Name: LMA Insertion Date/Time: 08/13/2016 7:37 AM Performed by: Dione Booze Pre-anesthesia Checklist: Emergency Drugs available, Suction available, Patient being monitored and Patient identified Patient Re-evaluated:Patient Re-evaluated prior to induction Oxygen Delivery Method: Circle system utilized Preoxygenation: Pre-oxygenation with 100% oxygen Induction Type: IV induction LMA: LMA with gastric port inserted LMA Size: 5.0 Placement Confirmation: positive ETCO2 and breath sounds checked- equal and bilateral Tube secured with: Tape Dental Injury: Teeth and Oropharynx as per pre-operative assessment

## 2016-08-13 NOTE — Transfer of Care (Signed)
Immediate Anesthesia Transfer of Care Note  Patient: [name]  Procedure(s) Performed: Procedure(s): IRRIGATION AND DEBRIDEMENT WOUND RIGHT FOOT (Right) APPLICATION OF A-CELL AND VAC (Right)  Patient Location: PACU  Anesthesia Type:General  Level of Consciousness: awake, alert , oriented and patient cooperative  Airway & Oxygen Therapy: Patient Spontanous Breathing and Patient connected to face mask oxygen  Post-op Assessment: Report given to RN and Post -op Vital signs reviewed and stable  Post vital signs: Reviewed and stable  Last Vitals:  Vitals:   08/12/16 2026 08/13/16 0620  BP: 132/79 124/78  Pulse: 78 76  Resp: 16 16  Temp: 37 C 36.7 C    Last Pain:  Vitals:   08/13/16 0620  TempSrc: Oral  PainSc: 0-No pain      Patients Stated Pain Goal: 0 (99/24/26 8341)  Complications: No apparent anesthesia complications

## 2016-08-13 NOTE — Anesthesia Postprocedure Evaluation (Signed)
Anesthesia Post Note  Patient: [name]  Procedure(s) Performed: Procedure(s) (LRB): IRRIGATION AND DEBRIDEMENT WOUND RIGHT FOOT (Right) APPLICATION OF A-CELL AND VAC (Right)     Patient location during evaluation: PACU Anesthesia Type: General Level of consciousness: awake and alert Pain management: pain level controlled Vital Signs Assessment: post-procedure vital signs reviewed and stable Respiratory status: spontaneous breathing, nonlabored ventilation, respiratory function stable and patient connected to nasal cannula oxygen Cardiovascular status: blood pressure returned to baseline and stable Postop Assessment: no signs of nausea or vomiting Anesthetic complications: no    Last Vitals:  Vitals:   08/13/16 0845 08/13/16 0900  BP: 128/80 (!) 144/77  Pulse: 64 66  Resp: 12 14  Temp: 36.6 C 37.1 C    Last Pain:  Vitals:   08/13/16 0900  TempSrc:   PainSc: 0-No pain                 Katia Hannen

## 2016-08-13 NOTE — Progress Notes (Addendum)
PROGRESS NOTE                                                                                                                                                                                                             Patient Demographics:    [name], is a 46 y.o. male, DOB - November 12, 1970, DGL:[id number]  Admit date - 07/28/2016   Admitting Physician Ivor Costa, MD  Outpatient Primary MD for the patient is Heywood Bene, PA-C  LOS - 15  Outpatient Specialists: none  Chief Complaint  Patient presents with  . Dizziness       Brief Narrative 46 year old obese male with diabetes mellitus type 2, chronic kidney disease stage II who presented with infection of the right foot. X-ray was concerning for right foot toe metatarsal osteomyelitis. He was started on empiric vancomycin and Zosyn and underwent right fifth ray amputation. Wound culture grew Enterococcus faecalis sensitive to vancomycin. He then underwent fourth 3 amputation and placement of wound VAC. Antibiotic switched to Unasyn based on sensitivity. Patient had A-cell by plastic surgery on 7/25.    Subjective:   Patient seen after returning from OR. Denies any pain.   Assessment  & Plan :    Principal Problem:   Diabetic foot ulcer with osteomyelitis ([place]) Wound culture grew Enterococcus faecalis, sensitive to vancomycin. Right fifth ray amputation done on 5/18 followed by application of wound VAC. He then underwent right fourth toe ray amputation with complex closure and placement of wound VAC on 7/20. -He was on empiric vancomycin and Zosyn which was switched to Unasyn on 7/18 based on sensitivity. -Patient was taken to OR today for A-CELL placement. Upon discussing with Dr Marla Roe, the wound did not look actively infected or purulent but did not completely healed as well. Discussed antibiotic course with both orthopedics (Dr. Berenice Primas) and Dr.  Marla Roe. Patient has also received 2 weeks of IV antibiotics. I recommended another one week of IV Unasyn followed by another week of oral doxycycline and they both agree. Dr. Marla Roe will be seeing patient in the office in one week and if the wound does not appear to be healing or is infected she would consult ID for further antibiotic recommendations.  Pain control with when necessary Percocet.  PICC line ordered for home IV antibiotics. Also ordered for home wound VAC.  Active Problems: Type 2 diabetes mellitus with neuropathy/nephropathy Well-controlled with A1c 5.3.Patient is on Lantus 24 and 26 units twice a day along with pre-meal insulin 10 units twice a day. His CBGs well-controlled and I would monitor him on moderate sliding screw coverage with bedtime Lantus 20 units. Continue Flexeril when necessary    Dyslipidemia Continue Lipitor and Lopid    HTN (hypertension)   Morbid obesity (HCC)    Acute renal failure superimposed on stage 2 chronic kidney disease (HCC) Resolved.  Transaminitis Mild. Elevated alkaline phosphatase. No LFTs since 7/18, with repeat today.   Iron deficiency anemia Will add supplements.   Code Status : Full code  Family Communication  : wife at bedside  Disposition Plan  : Home possibly tomorrow after PICC line placed and home wound VAC arranged  Barriers For Discharge : Active symptoms  Consults  :   Orthopedics (Dr. Berenice Primas) Past surgery (Dr. Marla Roe)  Procedures  :   7/12 - Dr. Berenice Primas performed right fifth ray amputation, debridement of ths skin, subQ tissue, muscle, fasca at the site of complex wound infection   7/20 - 4th ray amputation on the right foot   7/25--excisional debridement of right foot skin and placement of A-cell   Antimicrobials:   Vancomycin 7/10 --> 7/18  Zosyn 7//10 --> 7/17  Unasyn 7/18 -->until 8/2, then oral doxycycline until 8/9  DVT Prophylaxis  :  Lovenox -   Lab Results  Component Value  Date   PLT 480 (H) 08/13/2016    Antibiotics  :    Anti-infectives    Start     Dose/Rate Route Frequency Ordered Stop   08/13/16 1200  doxycycline (VIBRA-TABS) tablet 100 mg  Status:  Discontinued     100 mg Oral Every 12 hours 08/13/16 1009 08/13/16 1038   08/13/16 1100  Ampicillin-Sulbactam (UNASYN) 3 g in sodium chloride 0.9 % 100 mL IVPB     3 g 200 mL/hr over 30 Minutes Intravenous Every 6 hours 08/13/16 1038     08/13/16 0755  polymyxin B 500,000 Units, bacitracin 50,000 Units in sodium chloride 0.9 % 500 mL irrigation  Status:  Discontinued       As needed 08/13/16 0755 08/13/16 0823   08/13/16 0600  ceFAZolin (ANCEF) IVPB 2g/100 mL premix     2 g 200 mL/hr over 30 Minutes Intravenous On call to O.R. 08/12/16 1935 08/13/16 0754   08/08/16 1338  polymyxin B 500,000 Units, bacitracin 50,000 Units in sodium chloride 0.9 % 500 mL irrigation  Status:  Discontinued       As needed 08/08/16 1338 08/08/16 1457   08/08/16 0600  vancomycin (VANCOCIN) IVPB 1000 mg/200 mL premix     1,000 mg 200 mL/hr over 60 Minutes Intravenous On call to O.R. 08/08/16 0029 08/08/16 1400   08/06/16 1400  Ampicillin-Sulbactam (UNASYN) 3 g in sodium chloride 0.9 % 100 mL IVPB  Status:  Discontinued     3 g 200 mL/hr over 30 Minutes Intravenous Every 6 hours 08/06/16 1249 08/13/16 1009   08/03/16 2200  vancomycin (VANCOCIN) IVPB 750 mg/150 ml premix  Status:  Discontinued     750 mg 150 mL/hr over 60 Minutes Intravenous Every 24 hours 08/03/16 0932 08/03/16 0933   08/03/16 2200  vancomycin (VANCOCIN) 1,750 mg in sodium chloride 0.9 % 500 mL IVPB  Status:  Discontinued     1,750 mg 250 mL/hr over 120 Minutes Intravenous Every 24 hours 08/03/16 0933 08/06/16 1242   07/31/16 1322  polymyxin B 500,000 Units, bacitracin 50,000 Units in sodium chloride 0.9 % 500 mL irrigation  Status:  Discontinued       As needed 07/31/16 1337 07/31/16 1351   07/31/16 1233  ceFAZolin (ANCEF) 2-4 GM/100ML-% IVPB    Comments:   Key, Kristopher   : cabinet override      07/31/16 1233 08/01/16 0044   07/31/16 1100  ceFAZolin (ANCEF) IVPB 2g/100 mL premix  Status:  Discontinued     2 g 200 mL/hr over 30 Minutes Intravenous On call to O.R. 07/31/16 0725 07/31/16 1440   07/29/16 2200  vancomycin (VANCOCIN) 1,250 mg in sodium chloride 0.9 % 250 mL IVPB  Status:  Discontinued     1,250 mg 166.7 mL/hr over 90 Minutes Intravenous Every 24 hours 07/29/16 0052 08/03/16 0932   07/29/16 0600  piperacillin-tazobactam (ZOSYN) IVPB 3.375 g  Status:  Discontinued     3.375 g 12.5 mL/hr over 240 Minutes Intravenous Every 8 hours 07/29/16 0048 08/05/16 0936   07/29/16 0045  vancomycin (VANCOCIN) 2,500 mg in sodium chloride 0.9 % 500 mL IVPB     2,500 mg 250 mL/hr over 120 Minutes Intravenous  Once 07/29/16 0003 07/29/16 0310   07/29/16 0015  ceFEPIme (MAXIPIME) 2 g in dextrose 5 % 50 mL IVPB  Status:  Discontinued     2 g 100 mL/hr over 30 Minutes Intravenous Every 12 hours 07/29/16 0002 07/29/16 0048        Objective:   Vitals:   08/13/16 0900 08/13/16 1000 08/13/16 1100 08/13/16 1213  BP: (!) 144/77 118/76 133/82 138/88  Pulse: 66 62 64 65  Resp: 14 14 15 14   Temp: 98.7 F (37.1 C) 97.7 F (36.5 C) 98 F (36.7 C) 97.9 F (36.6 C)  TempSrc:  Oral Oral Oral  SpO2: 100% 100% 100% 100%  Weight:      Height:        Wt Readings from Last 3 Encounters:  07/28/16 118.8 kg (262 lb)  07/29/16 118.8 kg (262 lb)  07/23/16 124.7 kg (275 lb)     Intake/Output Summary (Last 24 hours) at 08/13/16 1359 Last data filed at 08/13/16 1213  Gross per 24 hour  Intake             1020 ml  Output             1850 ml  Net             -830 ml     Physical Exam  Gen: Middle aged obese male not in distress HEENT: moist mucosa, supple neck Chest: clear b/l, no added sounds CVS: N S1&S2, no murmurs GI: soft, NT, ND,  Musculoskeletal: warm, no edema, dressing over right foot with Ace wrap     Data Review:     CBC  Recent Labs Lab 08/09/16 0457 08/10/16 0519 08/11/16 0445 08/12/16 0514 08/13/16 0521  WBC 14.2* 12.9* 10.3 9.1 7.6  HGB 9.0* 8.6* 8.2* 8.3* 8.6*  HCT 28.0* 26.6* 25.6* 25.9* 26.6*  PLT 444* 465* 439* 452* 480*  MCV 77.6* 77.6* 76.0* 77.5* 76.2*  MCH 24.9* 25.1* 24.3* 24.9* 24.6*  MCHC 32.1 32.3 32.0 32.0 32.3  RDW 14.3 14.4 14.3 14.3 14.2    Chemistries   Recent Labs Lab 08/09/16 0457 08/10/16 0519 08/11/16 0445 08/12/16 0514 08/13/16 0521  NA 138 138 138 139 139  K 4.1 3.9 3.9 4.2 4.0  CL 103 105 103 104 105  CO2 25 24 26 27  28  GLUCOSE 134* 128* 130* 127* 133*  BUN 20 18 19  21* 20  CREATININE 1.18 1.19 1.23 1.22 1.21  CALCIUM 9.0 8.9 9.0 9.1 9.2   ------------------------------------------------------------------------------------------------------------------ No results for input(s): CHOL, HDL, LDLCALC, TRIG, CHOLHDL, LDLDIRECT in the last 72 hours.  Lab Results  Component Value Date   HGBA1C 5.3 07/29/2016   ------------------------------------------------------------------------------------------------------------------ No results for input(s): TSH, T4TOTAL, T3FREE, THYROIDAB in the last 72 hours.  Invalid input(s): FREET3 ------------------------------------------------------------------------------------------------------------------ No results for input(s): VITAMINB12, FOLATE, FERRITIN, TIBC, IRON, RETICCTPCT in the last 72 hours.  Coagulation profile No results for input(s): INR, PROTIME in the last 168 hours.  No results for input(s): DDIMER in the last 72 hours.  Cardiac Enzymes No results for input(s): CKMB, TROPONINI, MYOGLOBIN in the last 168 hours.  Invalid input(s): CK ------------------------------------------------------------------------------------------------------------------ No results found for: BNP  Inpatient Medications  Scheduled Meds: . aspirin EC  81 mg Oral Daily  . atorvastatin  40 mg Oral q1800  .  enoxaparin (LOVENOX) injection  40 mg Subcutaneous Q24H  . gemfibrozil  600 mg Oral BID AC  . insulin aspart  0-9 Units Subcutaneous TID WC  . [START ON 08/14/2016] multivitamin with minerals  1 tablet Oral Daily  . omega-3 acid ethyl esters  1 g Oral Daily  . vitamin B-12  5,000 mcg Oral Daily   Continuous Infusions: . ampicillin-sulbactam (UNASYN) IV 3 g (08/13/16 1135)   PRN Meds:.acetaminophen **OR** acetaminophen, cyclobenzaprine, diphenhydrAMINE, hydrALAZINE, ondansetron **OR** ondansetron (ZOFRAN) IV, oxyCODONE-acetaminophen, zolpidem  Micro Results No results found for this or any previous visit (from the past 240 hour(s)).  Radiology Reports Dg Chest 2 View  Result Date: 07/28/2016 CLINICAL DATA:  Dizziness and vomiting.  Sepsis protocol. EXAM: CHEST  2 VIEW COMPARISON:  Radiographs 09/08/2006 FINDINGS: Lung volumes are low. The cardiomediastinal contours are normal. The lungs are clear. Pulmonary vasculature is normal. No consolidation, pleural effusion, or pneumothorax. No acute osseous abnormalities are seen. IMPRESSION: Low lung volumes without acute abnormality. Electronically Signed   By: Jeb Levering M.D.   On: 07/28/2016 23:26   Mr Foot Right W Contrast  Result Date: 07/30/2016 CLINICAL DATA:  Right foot pain.  Ulcer of the fifth toe. EXAM: MRI OF THE RIGHT FOREFOOT WITH CONTRAST TECHNIQUE: Multiplanar, multisequence MR imaging of the right forefoot was performed following the administration of intravenous contrast. CONTRAST:  32mL MULTIHANCE GADOBENATE DIMEGLUMINE 529 MG/ML IV SOLN COMPARISON:  None. FINDINGS: Bones/Joint/Cartilage Soft tissue ulcer overlying the fifth metatarsal with associated soft tissue emphysema along the dorsal aspect. Cortical destruction and severe marrow edema of the fifth metatarsal head. Severe marrow edema of the fifth proximal phalanx with cortical irregularity along the base. Marrow edema in the fifth distal phalanx. No other marrow signal  abnormality. No acute fracture dislocation. Normal alignment. No joint effusion. Ligaments Collateral ligaments are intact.  Lisfranc ligament is intact. Muscles and Tendons Flexor, peroneal and extensor compartment tendons are intact. Muscles are normal. Soft tissue Soft tissue swelling and enhancement of the dorsal aspect of the foot concerning for cellulitis. No drainable fluid collection. No soft tissue mass. IMPRESSION: 1. Soft tissue ulcer overlying the fifth metatarsal with surrounding cellulitis and soft tissue emphysema. Osteomyelitis involving the fifth metatarsal head and base of the fifth proximal phalanx. Mild marrow edema in the fifth distal phalanx likely reactive. 2. No drainable fluid collection to suggest an abscess. Electronically Signed   By: Kathreen Devoid   On: 07/30/2016 08:28   Dg Foot Complete Right  Result Date: 07/28/2016 CLINICAL  DATA:  Diabetic foot ulcer. EXAM: RIGHT FOOT COMPLETE - 3+ VIEW COMPARISON:  Radiographs 07/23/2016 FINDINGS: Increased mottled soft tissue air an increased size of soft tissue ulcer about the fifth metatarsal phalangeal joint. Decreased density in the fifth metatarsal head suspicious for acute osteomyelitis. No additional sites concerning for osteomyelitis. There are hammertoe deformity of the digits. Curvilinear density in the plantar soft tissues subjacent to the tarsal metatarsal joints may be calcification or foreign body, unchanged from prior exam. IMPRESSION: Worsening soft tissue ulcer and soft tissue air about the fifth metatarsal phalangeal joint. Decreased density of the fifth metatarsal head suspicious for osteomyelitis. Calcification versus chronic linear foreign body in the plantar soft tissues subjacent to the midfoot. Electronically Signed   By: Jeb Levering M.D.   On: 07/28/2016 23:29   Dg Foot Complete Right  Result Date: 07/23/2016 CLINICAL DATA:  Large "blister" or ulcer on plantar surface of rt foot around distal 3rd, 4th and 5th  metatarsals, w/o trauma or surgery, diabetes//a.c. EXAM: RIGHT FOOT COMPLETE - 3+ VIEW COMPARISON:  None. FINDINGS: There is soft tissue swelling along the lateral aspect of the foot, at the level of the fifth metatarsophalangeal joint. Locules of gas in this region are consistent with the history of ulceration. No soft tissue gas elsewhere. No adjacent cortical erosion. No associated radiopaque foreign body. There is no acute fracture or dislocation. IMPRESSION: Soft tissue swelling and ulcer in the region of the fifth metatarsophalangeal joint. Electronically Signed   By: Nolon Nations M.D.   On: 07/23/2016 17:31    Time Spent in minutes  35   Louellen Molder M.D on 08/13/2016 at 1:59 PM  Between 7am to 7pm - Pager - [phone]  After 7pm go to [email or web address] - password [facility]  [facility] -  Office  [phone]

## 2016-08-13 NOTE — Progress Notes (Signed)
[facility]  New pt for [facility] this admission.  [facility] will provide [facility] services for home IV ABX.    [facility] will be prepared for DC home when ordered.  If patient discharges after hours, please call [phone].   Larry Sierras 08/13/2016, 10:34 AM

## 2016-08-13 NOTE — Discharge Instructions (Signed)
[facility] to begin weekly VAC dressing changes to foot wound, apply adaptic to wound bed, then surgical lubricant and then VAC dressing and resume therapy at 125 mm Hg continuous therapy. Will need first Home health dressing change on August 3 rd.

## 2016-08-13 NOTE — Care Management Note (Signed)
Case Management Note  Patient Details  Name: [name] MRN: [id number] Date of Birth: 05/28/1970  Subjective/Objective:  [facility] wound vac chosen-faxed [facility] wound vac form,h&p,demographics,op note to fax#[phone] w/confirmation-Ricki [facility] rep already following. [facility] for iv abx,HHRN-instruction. Await HHC orders-vac dsg changes,iv abx script,picc line flushes.                 Action/Plan:d/c home w/HHC/iv abx/wound vac   Expected Discharge Date:                  Expected Discharge Plan:  [place]  In-House Referral:     Discharge planning Services  CM Consult  Post Acute Care Choice:    Choice offered to:  Patient  DME Arranged:  Vac DME Agency:  [facility]  HH Arranged:  RN, IV Antibiotics HH Agency:  [facility]  Status of Service:  In process, will continue to follow  If discussed at Long Length of Stay Meetings, dates discussed:    Additional Comments:  Dessa Phi, RN 08/13/2016, 11:15 AM

## 2016-08-14 ENCOUNTER — Encounter (HOSPITAL_COMMUNITY): Payer: Self-pay | Admitting: Plastic Surgery

## 2016-08-14 ENCOUNTER — Ambulatory Visit: Payer: Self-pay | Admitting: *Deleted

## 2016-08-14 ENCOUNTER — Other Ambulatory Visit: Payer: Self-pay | Admitting: *Deleted

## 2016-08-14 DIAGNOSIS — E119 Type 2 diabetes mellitus without complications: Secondary | ICD-10-CM

## 2016-08-14 DIAGNOSIS — N179 Acute kidney failure, unspecified: Secondary | ICD-10-CM

## 2016-08-14 DIAGNOSIS — R7989 Other specified abnormal findings of blood chemistry: Secondary | ICD-10-CM

## 2016-08-14 DIAGNOSIS — R945 Abnormal results of liver function studies: Secondary | ICD-10-CM

## 2016-08-14 DIAGNOSIS — N182 Chronic kidney disease, stage 2 (mild): Secondary | ICD-10-CM

## 2016-08-14 DIAGNOSIS — S91104A Unspecified open wound of right lesser toe(s) without damage to nail, initial encounter: Secondary | ICD-10-CM

## 2016-08-14 LAB — GLUCOSE, CAPILLARY
Glucose-Capillary: 120 mg/dL — ABNORMAL HIGH (ref 65–99)
Glucose-Capillary: 116 mg/dL — ABNORMAL HIGH (ref 65–99)

## 2016-08-14 MED ORDER — ACETAMINOPHEN 325 MG PO TABS: 650.0000 mg | ORAL_TABLET | Freq: Four times a day (QID) | ORAL | 0 refills | Status: DC | PRN

## 2016-08-14 MED ORDER — AMPICILLIN-SULBACTAM IV (FOR PTA / DISCHARGE USE ONLY): 3.0000 g | Freq: Four times a day (QID) | INTRAVENOUS | 0 refills | Status: AC

## 2016-08-14 MED ORDER — INSULIN GLARGINE 100 UNIT/ML ~~LOC~~ SOLN: 20.0000 [IU] | Freq: Every day | SUBCUTANEOUS | 11 refills | Status: DC

## 2016-08-14 MED ORDER — FERROUS SULFATE 325 (65 FE) MG PO TABS: 325.0000 mg | ORAL_TABLET | Freq: Two times a day (BID) | ORAL | 3 refills | Status: DC

## 2016-08-14 MED ORDER — SODIUM CHLORIDE 0.9% FLUSH: 10.0000 mL | INTRAVENOUS | Status: DC | PRN

## 2016-08-14 MED ORDER — FENOFIBRATE 48 MG PO TABS: 48.0000 mg | ORAL_TABLET | Freq: Every day | ORAL | 0 refills | Status: DC

## 2016-08-14 MED ORDER — GLUCERNA SHAKE PO LIQD: 237.0000 mL | Freq: Two times a day (BID) | ORAL | 0 refills | Status: DC

## 2016-08-14 MED ORDER — INSULIN LISPRO 100 UNIT/ML ~~LOC~~ SOLN: 4.0000 [IU] | Freq: Two times a day (BID) | SUBCUTANEOUS | 11 refills | Status: DC

## 2016-08-14 MED ORDER — DOXYCYCLINE HYCLATE 100 MG PO TABS: 100.0000 mg | ORAL_TABLET | Freq: Two times a day (BID) | ORAL | 0 refills | Status: AC

## 2016-08-14 MED ORDER — ASCORBIC ACID 500 MG PO TABS: 500.0000 mg | ORAL_TABLET | Freq: Every day | ORAL | 0 refills | Status: DC

## 2016-08-14 MED FILL — DOXYCYCLINE HYC 100 MG TAB: 100 | 7 days supply | Qty: 14 | Fill #0

## 2016-08-14 MED FILL — FENOFIBRATE 48 MG TABLET: 48 | 30 days supply | Qty: 30 | Fill #0

## 2016-08-14 NOTE — Consult Note (Signed)
   [facility] Inpatient Consult   08/14/2016  [name] May 13, 1970 [id number]    [facility] Care Management/ [facility] follow up.  Spoke with [name] at bedside. He is active with the Link [facility] [employer] DM program for [employer] [facility] employees/dependents with [employer].   [name] endorses he will discharge home today with home health. Stated he was awaiting his wound vac to be delivered prior to discharge.   States he wants Probation officer to pass along to [employer] to [employer] that his DM medications have been adjusted. He is aware that he will receive post hospital discharge call.   Will send notification to [facility] RNCM about above.   Inpatient RNCM made aware that [name] is active with Link to [employer] for [employer].   Marthenia Rolling, MSN-Ed, RN,BSN [facility] Liaison [phone]

## 2016-08-14 NOTE — Patient Outreach (Signed)
Spoke with [name] via his mobile phone as he was being driven home from the hospital after an extended inpatient stay. He was admitted on 7/9  For a diabetic foot ulcer with osteomyelitis requiring right 4th and 5th ray amputation and IV antibiotics and placement of wound VAC. He will receive IV Unasyn every 6 hours at home and home physical therapy with services provided by [facility]. This RNCM called him to clarify the dose of his  Lantus insulin  as he sent a messaged in [place] that his Lantus  dose was 25 units at hs but the discharge summary reads 20 units, his Humalog dose was also decreased to 4 units twice daily with breakfast and dinner. [name] acknowledges that the Lantus does was lowered after he sent the [facility] message at 12:20 pm today and he is aware of the correct dose. Julio Sicks that the correct doses for the Lantus and Humalog have been made to his [facility] care plan. He states he is to meet the [facility] IV team at 5:00 pm at his home so that he can be taught how to administer the IV Unasyn. Arranged to call [name] tomorrow to complete the 1st transition of care outreach. [name] expressed his appreciation for the call and the [place] diabetes self management assistance that this RNCM has been providing.  Barrington Ellison RN,CCM,CDE [place] Management Coordinator [facility] and [employer] [phone] Office Fax [phone]

## 2016-08-14 NOTE — Care Management Note (Signed)
Case Management Note  Patient Details  Name: Dorian M [name] MRN: [id number] Date of Birth: [birth year]  Subjective/Objective:  Per [employer]-wound vac rep Ricki-pending auth for wound vac-she will update  Me on the status.                  Action/Plan:d/c home w/HHC/wound vac.   Expected Discharge Date:                  Expected Discharge Plan:  [place]  In-House Referral:     Discharge planning Services  CM Consult  Post Acute Care Choice:    Choice offered to:  Patient  DME Arranged:  Vac DME Agency:  [facility]  HH Arranged:  RN, IV Antibiotics HH Agency:  [facility]  Status of Service:  In process, will continue to follow  If discussed at Long Length of Stay Meetings, dates discussed:    Additional Comments:  Dessa Phi, RN 08/14/2016, 9:14 AM

## 2016-08-14 NOTE — Progress Notes (Signed)
Peripherally Inserted Central Catheter/Midline Placement  The IV Nurse has discussed with the patient and/or persons authorized to consent for the patient, the purpose of this procedure and the potential benefits and risks involved with this procedure.  The benefits include less needle sticks, lab draws from the catheter, and the patient may be discharged home with the catheter. Risks include, but not limited to, infection, bleeding, blood clot (thrombus formation), and puncture of an artery; nerve damage and irregular heartbeat and possibility to perform a PICC exchange if needed/ordered by physician.  Alternatives to this procedure were also discussed.  Bard Power PICC patient education guide, fact sheet on infection prevention and patient information card has been provided to patient /or left at bedside.    PICC/Midline Placement Documentation        [name] 08/14/2016, 8:53 AM

## 2016-08-14 NOTE — Progress Notes (Signed)
Per rep Alex-[employer] wound vac authorized, & will be delivered to hospital between 12-5p. Patient informed. Iv abx already setup w/[facility].

## 2016-08-14 NOTE — Progress Notes (Signed)
Rn reviewed discharge instructions with patient and family. All questions answered. Patient has [place] set up with [place] for IV abx, and wound vac therapy.  Paperwork and prescriptions given to patient.   NT rolled patient down with all belongings to family car.

## 2016-08-14 NOTE — Discharge Summary (Signed)
Physician Discharge Summary  [name] KTG:[id number] DOB: [birth year] DOA: 07/28/2016  PCP: Heywood Bene, PA-C  Admit date: 07/28/2016 Discharge date: 08/14/2016  Admitted From: Home Disposition:  Home  Recommendations for Outpatient Follow-up:  1. Follow up with Dr Marla Roe in 1 week ( has appt on 7/31). 2. Patient is being discharged on Unasyn until 8/1. His wound reevaluated by Dr. Marla Roe before that and if it looks improved he will start taking oral doxycycline for 1 more week starting 8/2. If the wound appears infected Dr. Marla Roe is going to consult infectious disease for antibiotic course. 3. Follow-up with orthopedics Dr. Berenice Primas in 2 weeks.  Home Health: RN/PT Equipment/Devices: None  Discharge Condition: Fair CODE STATUS: Full code Diet recommendation: carb modified heart healthy    Discharge Diagnoses:  Principal Problem:   Diabetic foot ulcer with osteomyelitis ([place])   Active Problems:   Diabetic foot infection ([place])   Diabetes mellitus without complication ([place])   Dyslipidemia   HTN (hypertension)   Morbid obesity ([place])   Acute renal failure superimposed on stage 2 chronic kidney disease ([place])   Anemia  Brief narrative/history of present illness 46 year old obese male with diabetes mellitus type 2, chronic kidney disease stage II who presented with infection of the right foot. X-ray was concerning for right foot toe metatarsal osteomyelitis. He was started on empiric vancomycin and Zosyn and underwent right fifth ray amputation. Wound culture grew Enterococcus faecalis sensitive to vancomycin. He then underwent fourth 3 amputation and placement of wound VAC. Antibiotic switched to Unasyn based on sensitivity. Patient had A-cell by plastic surgery on 7/25.   Hospital course  Principal Problem:   Diabetic foot ulcer with osteomyelitis ([place]) Wound culture grew Enterococcus faecalis, sensitive to vancomycin and ampicillin. Right fifth ray  amputation done on 4/28 followed by application of wound VAC. He then underwent right fourth toe ray amputation with complex closure and placement of wound VAC on 7/20. -He was on empiric vancomycin and Zosyn which was switched to Unasyn on 7/18 based on sensitivity. -Patient was taken to OR on 7/25 for A-CELL placement. Upon discussing with Dr Marla Roe, the wound did not look actively infected or purulent but had not completely healed as well. Discussed antibiotic course with both orthopedics (Dr. Berenice Primas) and Dr. Marla Roe. Patient has already received 2 weeks of IV antibiotics. I recommended another one week of IV Unasyn followed by another week of oral doxycycline and they both agree. Dr. Marla Roe will be seeing patient in the office next week and if the wound does not appear to be healing or is infected she would consult ID for further antibiotic recommendations. -Added protein supplement and vitamin C to enhance wound healing.  Patient has not ambulated since instructed by the surgeon to let his wound heal properly. He has not required any pain medications.  Right arm PICC line was placed for home IV antibiotics. Also ordered for home wound VAC which would be delivered today.   Active Problems: Type 2 diabetes mellitus with neuropathy/nephropathy Well-controlled with A1c 5.3. patient is on Lantus 26 and 24 units twice a day along with pre-meal insulin of 10 units twice a day. He showed me his CBG logs at home and has frequent readings below 60.  His CBG is well controlled on 20 units of Lantus at bedtime here. I have instructed him to reduce his Lantus dose to 20 units at bedtime and use short-acting insulin 4 units before meals twice a day. -Also encouraged him on keeping  the CBG log and show it to his PCP outpatient follow-up.     Dyslipidemia Continue Lipitor . Gemfibrozil switch to fenofibrate given high risk of rhabdomyolysis with combination of statin and gemfibrozil.      Morbid obesity ([place]) Patient reports that he has lost almost 40 pounds since the beginning of the year and was encouraged.    Acute renal failure superimposed on stage 2 chronic kidney disease ([place]) Resolved.  Transaminitis Improved on subsequent lab. Alkaline phosphatase of 143.   Iron deficiency anemia Added supplements.    Code Status : Full code  Family Communication  : wife at bedside  Disposition Plan  : Home    Consults  :   Orthopedics (Dr. Berenice Primas) Past surgery (Dr. Marla Roe)  Procedures  :   7/12 - Dr. Berenice Primas performed right fifth ray amputation, debridement of ths skin, subQ tissue, muscle, fasca at the site of complex wound infection   7/20 - 4th ray amputation on the right foot   7/25--excisional debridement of right foot skin and placement of A-cell   Antimicrobials:   Vancomycin 7/10 --> 7/18  Zosyn 7//10 --> 7/17  Unasyn 7/18 -->until 8/1, then oral doxycycline until 8/9 ( unless wound appears infected during outpatient evaluation next week)  Discharge Instructions  Discharge Instructions    Home infusion instructions [place] May follow [place] Dosing Protocol; May administer Cathflo as needed to maintain patency of vascular access device.; Flushing of vascular access device: per [facility] Protocol: 0.9% NaCl pre/post medica...    Complete by:  As directed    Instructions:  May follow [place] Dosing Protocol   Instructions:  May administer Cathflo as needed to maintain patency of vascular access device.   Instructions:  Flushing of vascular access device: per [facility] Protocol: 0.9% NaCl pre/post medication administration and prn patency; Heparin 100 u/ml, 25m for implanted ports and Heparin 10u/ml, 553mfor all other central venous catheters.   Instructions:  May follow [facility] Anaphylaxis Protocol for First Dose Administration in the home: 0.9% NaCl at 25-50 ml/hr to maintain IV access for protocol meds. Epinephrine 0.3 ml IV/IM  PRN and Benadryl 25-50 IV/IM PRN s/s of anaphylaxis.   Instructions:  AdSaulsburynfusion Coordinator (RN) to assist per patient IV care needs in the home PRN.     Allergies as of 08/14/2016   No Known Allergies     Medication List    STOP taking these medications   gemfibrozil 600 MG tablet Commonly known as:  LOPID   sulfamethoxazole-trimethoprim 800-160 MG tablet Commonly known as:  BACTRIM DS,SEPTRA DS     TAKE these medications   acetaminophen 325 MG tablet Commonly known as:  TYLENOL Take 2 tablets (650 mg total) by mouth every 6 (six) hours as needed for mild pain (or Fever >/= 101).   ampicillin-sulbactam IVPB Commonly known as:  UNASYN Inject 3 g into the vein every 6 (six) hours. Indication: diabetic foot ulcer with osteomyelitis Last Day of Therapy:  08/20/2016 (then oral doxycyline x 1 week) Labs - Once weekly:  CBC/D and BMP, Labs - Every other week:  ESR and CRP   ascorbic acid 500 MG tablet Commonly known as:  VITAMIN C Take 1 tablet (500 mg total) by mouth daily.   aspirin EC 81 MG tablet Take 81 mg by mouth daily.   atorvastatin 40 MG tablet Commonly known as:  LIPITOR Take 40 mg by mouth daily.   doxycycline 100 MG tablet Commonly known as:  VIBRA-TABS Take 1 tablet (100 mg total) by mouth 2 (two) times daily. Start taking on:  08/21/2016   feeding supplement (GLUCERNA SHAKE) Liqd Take 237 mLs by mouth 2 (two) times daily between meals.   fenofibrate 48 MG tablet Commonly known as:  TRICOR Take 1 tablet (48 mg total) by mouth daily.   ferrous sulfate 325 (65 FE) MG tablet Take 1 tablet (325 mg total) by mouth 2 (two) times daily with a meal.   hydrochlorothiazide 12.5 MG capsule Commonly known as:  MICROZIDE Take 12.5 mg by mouth daily.   insulin glargine 100 UNIT/ML injection Commonly known as:  LANTUS Inject 0.2 mLs (20 Units total) into the skin at bedtime. What changed:  how much to take  when to take this  additional  instructions   insulin lispro 100 UNIT/ML injection Commonly known as:  HUMALOG Inject 0.04 mLs (4 Units total) into the skin 2 (two) times daily. What changed:  how much to take   multivitamin with minerals Tabs tablet Take 1 tablet by mouth 2 (two) times daily.   omega-3 acid ethyl esters 1 g capsule Commonly known as:  LOVAZA Take 1 g by mouth daily.   ramipril 5 MG capsule Commonly known as:  ALTACE Take 5 mg by mouth daily.   sitaGLIPtin 50 MG tablet Commonly known as:  JANUVIA Take 50 mg by mouth daily.   Vitamin B-12 5000 MCG Subl Place 5,000 mcg under the tongue daily.            Home Infusion Instuctions        Start     Ordered   08/14/16 0000  Home infusion instructions [facility] May follow [place] Dosing Protocol; May administer Cathflo as needed to maintain patency of vascular access device.; Flushing of vascular access device: per [facility] Protocol: 0.9% NaCl pre/post medica...    Question Answer Comment  Instructions May follow [place] Dosing Protocol   Instructions May administer Cathflo as needed to maintain patency of vascular access device.   Instructions Flushing of vascular access device: per [facility] Protocol: 0.9% NaCl pre/post medication administration and prn patency; Heparin 100 u/ml, 15m for implanted ports and Heparin 10u/ml, 545mfor all other central venous catheters.   Instructions May follow [facility] Anaphylaxis Protocol for First Dose Administration in the home: 0.9% NaCl at 25-50 ml/hr to maintain IV access for protocol meds. Epinephrine 0.3 ml IV/IM PRN and Benadryl 25-50 IV/IM PRN s/s of anaphylaxis.   Instructions [facility] Infusion Coordinator (RN) to assist per patient IV care needs in the home PRN.      08/14/16 1020     Follow-up Information    [place], ClLoel LoftyDO. Schedule an appointment as soon as possible for a visit on 08/19/2016.   Specialty:  Plastic Surgery Why:  Appointment made for 08/19/16 for 10:40 am   Contact information: [id number]   Why:  HH nursing/iv abx,[facility] wound vac,picc flushes Contact information: [phone]        GrDorna LeitzMD. Schedule an appointment as soon as possible for a visit in 2 week(s).   Specialty:  Orthopedic Surgery Contact information: [address] [phone]      WiHeywood BenePA-C Follow up in 1 week(s).   Specialty:  Physician Assistant Contact information: [phone]  [place] [address] [place] [address] [phone]          No Known Allergies    Procedures/Studies: Dg Chest 2 View  Result Date: 07/28/2016 CLINICAL DATA:  Dizziness and vomiting.  Sepsis protocol. EXAM: CHEST  2 VIEW COMPARISON:  Radiographs 09/08/2006 FINDINGS: Lung volumes are low. The cardiomediastinal contours are normal. The lungs are clear. Pulmonary vasculature is normal. No consolidation, pleural effusion, or pneumothorax. No acute osseous abnormalities are seen. IMPRESSION: Low lung volumes without acute abnormality. Electronically Signed   By: Jeb Levering M.D.   On: 07/28/2016 23:26   Mr Foot Right W Contrast  Result Date: 07/30/2016 CLINICAL DATA:  Right foot pain.  Ulcer of the fifth toe. EXAM: MRI OF THE RIGHT FOREFOOT WITH CONTRAST TECHNIQUE: Multiplanar, multisequence MR imaging of the right forefoot was performed following the administration of intravenous contrast. CONTRAST:  65m MULTIHANCE GADOBENATE DIMEGLUMINE 529 MG/ML IV SOLN COMPARISON:  None. FINDINGS: Bones/Joint/Cartilage Soft tissue ulcer overlying the fifth metatarsal with associated soft tissue emphysema along the dorsal aspect. Cortical destruction and severe marrow edema of the fifth metatarsal head. Severe marrow edema of the fifth proximal phalanx with cortical irregularity along the base. Marrow edema in the  fifth distal phalanx. No other marrow signal abnormality. No acute fracture dislocation. Normal alignment. No joint effusion. Ligaments Collateral ligaments are intact.  Lisfranc ligament is intact. Muscles and Tendons Flexor, peroneal and extensor compartment tendons are intact. Muscles are normal. Soft tissue Soft tissue swelling and enhancement of the dorsal aspect of the foot concerning for cellulitis. No drainable fluid collection. No soft tissue mass. IMPRESSION: 1. Soft tissue ulcer overlying the fifth metatarsal with surrounding cellulitis and soft tissue emphysema. Osteomyelitis involving the fifth metatarsal head and base of the fifth proximal phalanx. Mild marrow edema in the fifth distal phalanx likely reactive. 2. No drainable fluid collection to suggest an abscess. Electronically Signed   By: HKathreen Devoid  On: 07/30/2016 08:28   Dg Foot Complete Right  Result Date: 07/28/2016 CLINICAL DATA:  Diabetic foot ulcer. EXAM: RIGHT FOOT COMPLETE - 3+ VIEW COMPARISON:  Radiographs 07/23/2016 FINDINGS: Increased mottled soft tissue air an increased size of soft tissue ulcer about the fifth metatarsal phalangeal joint. Decreased density in the fifth metatarsal head suspicious for acute osteomyelitis. No additional sites concerning for osteomyelitis. There are hammertoe deformity of the digits. Curvilinear density in the plantar soft tissues subjacent to the tarsal metatarsal joints may be calcification or foreign body, unchanged from prior exam. IMPRESSION: Worsening soft tissue ulcer and soft tissue air about the fifth metatarsal phalangeal joint. Decreased density of the fifth metatarsal head suspicious for osteomyelitis. Calcification versus chronic linear foreign body in the plantar soft tissues subjacent to the midfoot. Electronically Signed   By: MJeb LeveringM.D.   On: 07/28/2016 23:29   Dg Foot Complete Right  Result Date: 07/23/2016 CLINICAL DATA:  Large "blister" or ulcer on plantar surface  of rt foot around distal 3rd, 4th and 5th metatarsals, w/o trauma or surgery, diabetes//a.c. EXAM: RIGHT FOOT COMPLETE - 3+ VIEW COMPARISON:  None. FINDINGS: There is soft tissue swelling along the lateral aspect of the foot, at the level of the fifth metatarsophalangeal joint. Locules of gas in this region are consistent with the history of ulceration. No soft tissue gas elsewhere. No adjacent cortical erosion. No associated radiopaque foreign body. There is no acute fracture or dislocation. IMPRESSION: Soft tissue swelling and ulcer in the region of the fifth metatarsophalangeal joint. Electronically  Signed   By: Nolon Nations M.D.   On: 07/23/2016 17:31       Subjective: PICC line placed. Patient denies any pain in his right foot.  Discharge Exam: Vitals:   08/14/16 0632 08/14/16 0944  BP: 123/76 109/67  Pulse: 72 76  Resp: 15 16  Temp: 98.4 F (36.9 C) 98.8 F (37.1 C)   Vitals:   08/13/16 2300 08/14/16 0106 08/14/16 0632 08/14/16 0944  BP: 132/78 115/79 123/76 109/67  Pulse: 76 71 72 76  Resp: _0 Temp: 98.4 F (36.9 C) 98.6 F (37 C) 98.4 F (36.9 C) 98.8 F (37.1 C)  TempSrc: Oral Oral Oral Oral  SpO2: 100% 98% 100% 100%  Weight:      Height:        Gen.: Middle aged obese male in distress HEENT: Moist mucosa, supple neck Chest: Clear bilaterally, no added sounds CVS: Normal S1 and S2, no murmurs GI: Soft, nondistended, nontender Musculoskeletal: Warm, no edema, dressing lower right foot with Ace wrap, rt arm PICC    The results of significant diagnostics from this hospitalization (including imaging, microbiology, ancillary and laboratory) are listed below for reference.     Microbiology: No results found for this or any previous visit (from the past 240 hour(s)).   Labs: BNP (last 3 results) No results for input(s): BNP in the last 8760 hours. Basic Metabolic Panel:  Recent Labs Lab 08/09/16 0457 08/10/16 0519 08/11/16 0445 08/12/16 0514  08/13/16 0521  NA 138 138 138 139 139  K 4.1 3.9 3.9 4.2 4.0  CL 103 105 103 104 105  CO2 _1 GLUCOSE 134* 128* 130* 127* 133*  BUN _2 21* 20  CREATININE 1.18 1.19 1.23 1.22 1.21  CALCIUM 9.0 8.9 9.0 9.1 9.2   Liver Function Tests:  Recent Labs Lab 08/13/16 0521  AST 17  ALT 22  ALKPHOS 143*  BILITOT 0.5  PROT 7.8  ALBUMIN 2.5*   No results for input(s): LIPASE, AMYLASE in the last 168 hours. No results for input(s): AMMONIA in the last 168 hours. CBC:  Recent Labs Lab 08/09/16 0457 08/10/16 0519 08/11/16 0445 08/12/16 0514 08/13/16 0521  WBC 14.2* 12.9* 10.3 9.1 7.6  HGB 9.0* 8.6* 8.2* 8.3* 8.6*  HCT 28.0* 26.6* 25.6* 25.9* 26.6*  MCV 77.6* 77.6* 76.0* 77.5* 76.2*  PLT 444* 465* 439* 452* 480*   Cardiac Enzymes: No results for input(s): CKTOTAL, CKMB, CKMBINDEX, TROPONINI in the last 168 hours. BNP: Invalid input(s): POCBNP CBG:  Recent Labs Lab 08/13/16 0829 08/13/16 0956 08/13/16 1653 08/13/16 2244 08/14/16 0712  GLUCAP 130* 134* 117* 111* 120*   D-Dimer No results for input(s): DDIMER in the last 72 hours. Hgb A1c No results for input(s): HGBA1C in the last 72 hours. Lipid Profile No results for input(s): CHOL, HDL, LDLCALC, TRIG, CHOLHDL, LDLDIRECT in the last 72 hours. Thyroid function studies No results for input(s): TSH, T4TOTAL, T3FREE, THYROIDAB in the last 72 hours.  Invalid input(s): FREET3 Anemia work up No results for input(s): VITAMINB12, FOLATE, FERRITIN, TIBC, IRON, RETICCTPCT in the last 72 hours. Urinalysis    Component Value Date/Time   COLORURINE AMBER (A) 07/28/2016 1846   APPEARANCEUR CLOUDY (A) 07/28/2016 1846   LABSPEC 1.021 07/28/2016 1846   PHURINE 5.0 07/28/2016 1846   GLUCOSEU NEGATIVE 07/28/2016 1846   HGBUR NEGATIVE 07/28/2016 1846   BILIRUBINUR NEGATIVE 07/28/2016 1846   KETONESUR NEGATIVE 07/28/2016 1846   PROTEINUR 100 (  A) 07/28/2016 1846   UROBILINOGEN 0.2 01/26/2007 1759   NITRITE  NEGATIVE 07/28/2016 1846   LEUKOCYTESUR NEGATIVE 07/28/2016 1846   Sepsis Labs Invalid input(s): PROCALCITONIN,  WBC,  LACTICIDVEN Microbiology No results found for this or any previous visit (from the past 240 hour(s)).   Time coordinating discharge: Over 30 minutes  SIGNED:   Louellen Molder, MD  [facility] 08/14/2016, 10:27 AM Pager   If 7PM-7AM, please contact night-coverage [email or web address] Password [email or web address]

## 2016-08-15 ENCOUNTER — Other Ambulatory Visit: Payer: Self-pay | Admitting: *Deleted

## 2016-08-15 ENCOUNTER — Encounter (HOSPITAL_COMMUNITY): Payer: Self-pay | Admitting: Plastic Surgery

## 2016-08-15 NOTE — Patient Outreach (Signed)
Spoke with [name] by home phone to complete first transition of care phone call. He was discharged from the hospital on 7/26 after admission on 7/9 for a right diabetic foot ulcer with osteomyelitis that required 4th and 5th ray amputation with placement of wound VAC and IV antibiotics. He states he has never had any pain in the foot so he is not taking pain medicine now. (He has a history of bilateral diabetic neuropathy.) He states he was instructed on how to administer the IV Unsyn at home through his right PICC line by the [place] IV nurse last evening and is comfortable with the procedure. He says he is ambulating either with a rolling walker or a leg scooter and denies difficulty with ambulation. He says home health physical therapy was ordered but he does not think he needs it now since he was able to negotiate the steps into his home without prior instruction and is ambulating comfortably using the assistive devices of the rolling walker and leg scooter. The only medication he did not have that was listed on his discharge summary was the ferrous sulfate and his wife is to pick it up today. Reviewed the results of his last hemoglobin of 8.6 done 7/25 and normal values reviewed. Discussed the potential side effects of constipation and encouraged him to drink plenty of fluids and to eat foods high in fiber. Also reviewed foods that are high in iron. He remains on a diet program thorugh [facility] MD and has lost a total of 40+ lbs and he says he was very cognizant of eating well while hospitalized. He says he is not drinking any protein drinks even though Glucerna was on his discharge medication list because her does not like them. Reviewed his most recent protein level done 7/17 of 8.2 and reviewed normal value. Advised him that if he is instructed to consume Glucerna by any of his providers, there are discount coupons available at the [place] office that can be mailed to him. Reviewed his [place]  and inquired about the lack of blood sugar data and he stated the meter does not consistently connect to the [facility] app so instructed him on how to record the blood sugars manually. He reports his fasting blood sugar this morning was 117 and he entered the number manually in the app. This RNCM will continue to monitor his diabetes self management skills via the [facility] digital assistant program.  Informed [name] this RNCM will call him again next week per transition of care protocol and ensured he has contact information or advised him to message this RNCM in the [facility] app for any questions or concerns that may arise before next week's outreach. Barrington Ellison RN,CCM,CDE [place] Management Coordinator [facility] [employer] [phone] Office Fax [phone]

## 2016-08-18 DIAGNOSIS — M869 Osteomyelitis, unspecified: Secondary | ICD-10-CM | POA: Diagnosis not present

## 2016-08-19 DIAGNOSIS — L97509 Non-pressure chronic ulcer of other part of unspecified foot with unspecified severity: Secondary | ICD-10-CM | POA: Diagnosis not present

## 2016-08-19 DIAGNOSIS — L089 Local infection of the skin and subcutaneous tissue, unspecified: Secondary | ICD-10-CM | POA: Diagnosis not present

## 2016-08-19 DIAGNOSIS — Z794 Long term (current) use of insulin: Secondary | ICD-10-CM | POA: Diagnosis not present

## 2016-08-19 DIAGNOSIS — E11628 Type 2 diabetes mellitus with other skin complications: Secondary | ICD-10-CM | POA: Diagnosis not present

## 2016-08-19 DIAGNOSIS — M869 Osteomyelitis, unspecified: Secondary | ICD-10-CM | POA: Diagnosis not present

## 2016-08-19 DIAGNOSIS — E1122 Type 2 diabetes mellitus with diabetic chronic kidney disease: Secondary | ICD-10-CM | POA: Diagnosis not present

## 2016-08-19 DIAGNOSIS — N183 Chronic kidney disease, stage 3 (moderate): Secondary | ICD-10-CM | POA: Diagnosis not present

## 2016-08-19 DIAGNOSIS — E11621 Type 2 diabetes mellitus with foot ulcer: Secondary | ICD-10-CM | POA: Diagnosis not present

## 2016-08-19 DIAGNOSIS — E1169 Type 2 diabetes mellitus with other specified complication: Secondary | ICD-10-CM | POA: Diagnosis not present

## 2016-08-20 MED FILL — HumaLOG 100 UNIT/ML SOLN: 100 | 28 days supply | Qty: 10 | Fill #0 | Status: TO

## 2016-08-20 MED FILL — BD INSULIN SYR 0.5 ML 8MMX3: 31G X 5/16" | 90 days supply | Qty: 400 | Fill #0 | Status: TO

## 2016-08-20 MED FILL — ATORVASTATIN 40 MG TABLET: 40 | 90 days supply | Qty: 135 | Fill #2 | Status: TO

## 2016-08-20 MED FILL — JANUVIA 50 MG TABLET: 50 | 90 days supply | Qty: 90 | Fill #2 | Status: TO

## 2016-08-20 MED FILL — RAMIPRIL 5 MG CAPSULE: 5 | 90 days supply | Qty: 90 | Fill #2 | Status: TO

## 2016-08-20 MED FILL — ACCU-CHEK GUIDE TEST STRIP: 90 days supply | Qty: 100 | Fill #1 | Status: TO

## 2016-08-20 MED FILL — HYDROCHLOROTHIAZIDE 12.5 MG: 12.5 | 90 days supply | Qty: 90 | Fill #2 | Status: TO

## 2016-08-22 ENCOUNTER — Other Ambulatory Visit: Payer: Self-pay | Admitting: *Deleted

## 2016-08-22 MED FILL — LANTUS 100 UNITS/ML VIAL: 100 | 28 days supply | Qty: 10 | Fill #0 | Status: TO

## 2016-08-22 NOTE — Patient Outreach (Signed)
Second transition of care outreach completed via phone call. [name] continues to do well, has no complaints, ambulates with knee scooter as he cannot bear full weight on his right foot. He says he was seen in follow up by his primary care provider and plastic surgeon earlier this week. The plastic surgeon stopped the IV Unasyn and he was started on oral doxycycline twice daily.  He denies pain, says he occasionally has some mild tingling and burning in the right foot but not enough to require medication. He says he is drinking Glucerna twice daily in place of his normal snacks to increase his protein intake to promote wound healing.This RNCM offered Glucerna coupons and [name] will stop by the [facility] CM office on 8/7 to pick them up . He says the wound VAC is still in place and he will see his plastic surgeon again in follow up on 08/26/16. Reviewed [employer] [facility] data with [name] and his blood sugar control and medication adherence iis excellent. He says his PICC line is still in place and he had no labs drawn at either office visit this week but the home health nurse drew labs but he does not know the results.  Will plan to see [name] in the [facility] CM on 08/25/16. Barrington Ellison RN,CCM,CDE [place] Management Coordinator [facility] [employer] [phone] Office Fax [phone]

## 2016-08-26 ENCOUNTER — Other Ambulatory Visit: Payer: Self-pay | Admitting: *Deleted

## 2016-08-26 MED FILL — DOXYCYCLINE HYCLATE 100 MG: 100 | 14 days supply | Qty: 28 | Fill #0

## 2016-08-28 NOTE — Patient Outreach (Signed)
Met with [name], his wife [name] and their daughter [name] at the [place] office to assess his recovery from right foot 4th and 5th transmetatarsal amputation and to demonstrate the [employer]re flash glucose monitoring system. [name] continues to do well and per his [name] - his glycemic control is excellent. He reports he will go back to the plastic surgeon tomorrow for replacement of the VAC and Acell skin graph. He states he is no longer attending [facility] for weight loss but does continue to follow a CHO Controlled meal plan and he now weighs 263 lbs which reflects a total weight loss of  53 lbs. Will continue to monitor [name]'s DM self management via Careers information officer.  Barrington Ellison RN,CCM,CDE [employer] Management Coordinator [facility] [employer] [phone] Office Fax [phone]

## 2016-09-01 DIAGNOSIS — S91301A Unspecified open wound, right foot, initial encounter: Secondary | ICD-10-CM | POA: Diagnosis not present

## 2016-09-08 MED FILL — FENOFIBRATE 48 MG TABLET: 48 | 90 days supply | Qty: 90 | Fill #0 | Status: TO

## 2016-09-09 DIAGNOSIS — L97509 Non-pressure chronic ulcer of other part of unspecified foot with unspecified severity: Secondary | ICD-10-CM | POA: Diagnosis not present

## 2016-09-09 DIAGNOSIS — M869 Osteomyelitis, unspecified: Secondary | ICD-10-CM | POA: Diagnosis not present

## 2016-09-09 DIAGNOSIS — N183 Chronic kidney disease, stage 3 (moderate): Secondary | ICD-10-CM | POA: Diagnosis not present

## 2016-09-09 DIAGNOSIS — E1122 Type 2 diabetes mellitus with diabetic chronic kidney disease: Secondary | ICD-10-CM | POA: Diagnosis not present

## 2016-09-09 DIAGNOSIS — Z794 Long term (current) use of insulin: Secondary | ICD-10-CM | POA: Diagnosis not present

## 2016-09-09 DIAGNOSIS — E11621 Type 2 diabetes mellitus with foot ulcer: Secondary | ICD-10-CM | POA: Diagnosis not present

## 2016-09-09 DIAGNOSIS — E1169 Type 2 diabetes mellitus with other specified complication: Secondary | ICD-10-CM | POA: Diagnosis not present

## 2016-09-09 DIAGNOSIS — E1142 Type 2 diabetes mellitus with diabetic polyneuropathy: Secondary | ICD-10-CM | POA: Diagnosis not present

## 2016-09-12 ENCOUNTER — Encounter (HOSPITAL_BASED_OUTPATIENT_CLINIC_OR_DEPARTMENT_OTHER): Payer: Self-pay | Admitting: *Deleted

## 2016-09-12 DIAGNOSIS — Z794 Long term (current) use of insulin: Secondary | ICD-10-CM | POA: Diagnosis not present

## 2016-09-12 DIAGNOSIS — E1169 Type 2 diabetes mellitus with other specified complication: Secondary | ICD-10-CM | POA: Diagnosis not present

## 2016-09-12 DIAGNOSIS — L97509 Non-pressure chronic ulcer of other part of unspecified foot with unspecified severity: Secondary | ICD-10-CM | POA: Diagnosis not present

## 2016-09-12 DIAGNOSIS — E11621 Type 2 diabetes mellitus with foot ulcer: Secondary | ICD-10-CM | POA: Diagnosis not present

## 2016-09-12 DIAGNOSIS — E1142 Type 2 diabetes mellitus with diabetic polyneuropathy: Secondary | ICD-10-CM | POA: Diagnosis not present

## 2016-09-12 DIAGNOSIS — E1122 Type 2 diabetes mellitus with diabetic chronic kidney disease: Secondary | ICD-10-CM | POA: Diagnosis not present

## 2016-09-12 DIAGNOSIS — M869 Osteomyelitis, unspecified: Secondary | ICD-10-CM | POA: Diagnosis not present

## 2016-09-12 DIAGNOSIS — N183 Chronic kidney disease, stage 3 (moderate): Secondary | ICD-10-CM | POA: Diagnosis not present

## 2016-09-15 ENCOUNTER — Ambulatory Visit: Payer: Self-pay | Admitting: Plastic Surgery

## 2016-09-15 ENCOUNTER — Encounter (HOSPITAL_BASED_OUTPATIENT_CLINIC_OR_DEPARTMENT_OTHER): Payer: Self-pay | Admitting: *Deleted

## 2016-09-15 DIAGNOSIS — S91301A Unspecified open wound, right foot, initial encounter: Secondary | ICD-10-CM

## 2016-09-15 NOTE — Progress Notes (Signed)
NPO AFTER MN.  ARRIVE AT 0712. PER PT WAS FROM DR OFFICE IS WAS OK TO HAVE LATER THAN WE NORMALLY WOULD PT'S SINCE IS HAS TO TAKE CHILD TO SCHOOL AT 0745.  NEEDS ISTAT 8.  CURRENT EKG IN CHART AND EPIC.  WILL TAKE FENOFIBRATE, IRON, AND LIPITOR AM DOS W/ SIPS OF WATER.  PT POSTING PT WILL BRING WOUND VAC.

## 2016-09-16 DIAGNOSIS — E1122 Type 2 diabetes mellitus with diabetic chronic kidney disease: Secondary | ICD-10-CM | POA: Diagnosis not present

## 2016-09-16 DIAGNOSIS — M869 Osteomyelitis, unspecified: Secondary | ICD-10-CM | POA: Diagnosis not present

## 2016-09-16 DIAGNOSIS — E11621 Type 2 diabetes mellitus with foot ulcer: Secondary | ICD-10-CM | POA: Diagnosis not present

## 2016-09-16 DIAGNOSIS — Z794 Long term (current) use of insulin: Secondary | ICD-10-CM | POA: Diagnosis not present

## 2016-09-16 DIAGNOSIS — N183 Chronic kidney disease, stage 3 (moderate): Secondary | ICD-10-CM | POA: Diagnosis not present

## 2016-09-16 DIAGNOSIS — E1169 Type 2 diabetes mellitus with other specified complication: Secondary | ICD-10-CM | POA: Diagnosis not present

## 2016-09-16 DIAGNOSIS — L97509 Non-pressure chronic ulcer of other part of unspecified foot with unspecified severity: Secondary | ICD-10-CM | POA: Diagnosis not present

## 2016-09-18 ENCOUNTER — Ambulatory Visit (HOSPITAL_BASED_OUTPATIENT_CLINIC_OR_DEPARTMENT_OTHER): Payer: 59 | Admitting: Anesthesiology

## 2016-09-18 ENCOUNTER — Ambulatory Visit (HOSPITAL_BASED_OUTPATIENT_CLINIC_OR_DEPARTMENT_OTHER)
Admission: RE | Admit: 2016-09-18 | Discharge: 2016-09-18 | Disposition: A | Discharge status: Home / Self Care | Payer: 59 | Source: Ambulatory Visit | Attending: Plastic Surgery | Admitting: Plastic Surgery

## 2016-09-18 ENCOUNTER — Encounter (HOSPITAL_BASED_OUTPATIENT_CLINIC_OR_DEPARTMENT_OTHER): Payer: Self-pay | Admitting: Anesthesiology

## 2016-09-18 ENCOUNTER — Encounter (HOSPITAL_BASED_OUTPATIENT_CLINIC_OR_DEPARTMENT_OTHER)
Admission: RE | Disposition: A | Discharge status: Home / Self Care | Payer: Self-pay | Source: Ambulatory Visit | Attending: Plastic Surgery

## 2016-09-18 DIAGNOSIS — E782 Mixed hyperlipidemia: Secondary | ICD-10-CM | POA: Diagnosis not present

## 2016-09-18 DIAGNOSIS — Z794 Long term (current) use of insulin: Secondary | ICD-10-CM | POA: Diagnosis not present

## 2016-09-18 DIAGNOSIS — Z833 Family history of diabetes mellitus: Secondary | ICD-10-CM | POA: Diagnosis not present

## 2016-09-18 DIAGNOSIS — E785 Hyperlipidemia, unspecified: Secondary | ICD-10-CM | POA: Diagnosis not present

## 2016-09-18 DIAGNOSIS — Z6834 Body mass index (BMI) 34.0-34.9, adult: Secondary | ICD-10-CM | POA: Diagnosis not present

## 2016-09-18 DIAGNOSIS — N183 Chronic kidney disease, stage 3 (moderate): Secondary | ICD-10-CM | POA: Insufficient documentation

## 2016-09-18 DIAGNOSIS — S91301A Unspecified open wound, right foot, initial encounter: Secondary | ICD-10-CM

## 2016-09-18 DIAGNOSIS — E11621 Type 2 diabetes mellitus with foot ulcer: Secondary | ICD-10-CM | POA: Insufficient documentation

## 2016-09-18 DIAGNOSIS — Z79899 Other long term (current) drug therapy: Secondary | ICD-10-CM | POA: Diagnosis not present

## 2016-09-18 DIAGNOSIS — E1142 Type 2 diabetes mellitus with diabetic polyneuropathy: Secondary | ICD-10-CM | POA: Insufficient documentation

## 2016-09-18 DIAGNOSIS — I129 Hypertensive chronic kidney disease with stage 1 through stage 4 chronic kidney disease, or unspecified chronic kidney disease: Secondary | ICD-10-CM | POA: Diagnosis not present

## 2016-09-18 DIAGNOSIS — Z7982 Long term (current) use of aspirin: Secondary | ICD-10-CM | POA: Insufficient documentation

## 2016-09-18 DIAGNOSIS — E1122 Type 2 diabetes mellitus with diabetic chronic kidney disease: Secondary | ICD-10-CM | POA: Insufficient documentation

## 2016-09-18 DIAGNOSIS — E1169 Type 2 diabetes mellitus with other specified complication: Secondary | ICD-10-CM | POA: Diagnosis not present

## 2016-09-18 DIAGNOSIS — M869 Osteomyelitis, unspecified: Secondary | ICD-10-CM | POA: Diagnosis not present

## 2016-09-18 DIAGNOSIS — I10 Essential (primary) hypertension: Secondary | ICD-10-CM | POA: Diagnosis not present

## 2016-09-18 HISTORY — DX: Type 2 diabetes mellitus with other specified complication: E11.69

## 2016-09-18 HISTORY — DX: Type 2 diabetes mellitus with diabetic polyneuropathy: E11.42

## 2016-09-18 HISTORY — DX: Iron deficiency anemia, unspecified: D50.9

## 2016-09-18 HISTORY — PX: I & D EXTREMITY: SHX5045

## 2016-09-18 HISTORY — DX: Mixed hyperlipidemia: E78.2

## 2016-09-18 HISTORY — DX: Osteomyelitis, unspecified: M86.9

## 2016-09-18 HISTORY — DX: Type 2 diabetes mellitus without complications: E11.9

## 2016-09-18 HISTORY — DX: Long term (current) use of insulin: Z79.4

## 2016-09-18 HISTORY — DX: Type 2 diabetes mellitus with foot ulcer: E11.621

## 2016-09-18 HISTORY — DX: Chronic kidney disease, stage 3 (moderate): N18.3

## 2016-09-18 HISTORY — DX: Non-pressure chronic ulcer of other part of unspecified foot with unspecified severity: L97.509

## 2016-09-18 HISTORY — DX: Chronic kidney disease, stage 3 unspecified: N18.30

## 2016-09-18 HISTORY — PX: APPLICATION OF WOUND VAC: SHX5189

## 2016-09-18 LAB — GLUCOSE, CAPILLARY: Glucose-Capillary: 114 mg/dL — ABNORMAL HIGH (ref 65–99)

## 2016-09-18 LAB — POCT I-STAT, CHEM 8
BUN: 23 mg/dL — ABNORMAL HIGH (ref 6–20)
Calcium, Ion: 1.29 mmol/L (ref 1.15–1.40)
Chloride: 104 mmol/L (ref 101–111)
Creatinine, Ser: 1.4 mg/dL — ABNORMAL HIGH (ref 0.61–1.24)
Glucose, Bld: 135 mg/dL — ABNORMAL HIGH (ref 65–99)
HCT: 37 % — ABNORMAL LOW (ref 39.0–52.0)
Hemoglobin: 12.6 g/dL — ABNORMAL LOW (ref 13.0–17.0)
Potassium: 4.2 mmol/L (ref 3.5–5.1)
Sodium: 144 mmol/L (ref 135–145)
TCO2: 27 mmol/L (ref 22–32)

## 2016-09-18 SURGERY — IRRIGATION AND DEBRIDEMENT EXTREMITY
Anesthesia: General | Site: Foot | Laterality: Right

## 2016-09-18 MED ORDER — LIDOCAINE 2% (20 MG/ML) 5 ML SYRINGE
INTRAMUSCULAR | Status: DC | PRN
  Administered 2016-09-18: 100 mg via INTRAVENOUS

## 2016-09-18 MED ORDER — HYDROMORPHONE HCL 1 MG/ML IJ SOLN
0.2500 mg | INTRAMUSCULAR | Status: DC | PRN
  Filled 2016-09-18: qty 0.5

## 2016-09-18 MED ORDER — CEFAZOLIN SODIUM 1 G IJ SOLR
INTRAMUSCULAR | Status: AC
  Filled 2016-09-18: qty 20

## 2016-09-18 MED ORDER — MIDAZOLAM HCL 2 MG/2ML IJ SOLN
INTRAMUSCULAR | Status: AC
  Filled 2016-09-18: qty 2

## 2016-09-18 MED ORDER — SODIUM CHLORIDE 0.9 % IV SOLN
INTRAVENOUS | Status: DC | PRN
  Administered 2016-09-18: 500 mL

## 2016-09-18 MED ORDER — ACETAMINOPHEN 325 MG PO TABS
650.0000 mg | ORAL_TABLET | ORAL | Status: DC | PRN
  Filled 2016-09-18: qty 2

## 2016-09-18 MED ORDER — SODIUM CHLORIDE 0.9 % IV SOLN
INTRAVENOUS | Status: DC
  Administered 2016-09-18: 09:00:00 via INTRAVENOUS
  Filled 2016-09-18: qty 1000

## 2016-09-18 MED ORDER — FENTANYL CITRATE (PF) 100 MCG/2ML IJ SOLN
INTRAMUSCULAR | Status: DC | PRN
  Administered 2016-09-18: 50 ug via INTRAVENOUS

## 2016-09-18 MED ORDER — DEXAMETHASONE SODIUM PHOSPHATE 10 MG/ML IJ SOLN
INTRAMUSCULAR | Status: AC
  Filled 2016-09-18: qty 1

## 2016-09-18 MED ORDER — HYDROCODONE-ACETAMINOPHEN 5-325 MG PO TABS: 1.0000 | ORAL_TABLET | Freq: Four times a day (QID) | ORAL | 0 refills | Status: DC | PRN

## 2016-09-18 MED ORDER — OXYCODONE HCL 5 MG PO TABS
5.0000 mg | ORAL_TABLET | ORAL | Status: DC | PRN
  Filled 2016-09-18: qty 2

## 2016-09-18 MED ORDER — MIDAZOLAM HCL 5 MG/5ML IJ SOLN
INTRAMUSCULAR | Status: DC | PRN
  Administered 2016-09-18: 2 mg via INTRAVENOUS

## 2016-09-18 MED ORDER — SODIUM CHLORIDE 0.9% FLUSH
3.0000 mL | INTRAVENOUS | Status: DC | PRN
  Filled 2016-09-18: qty 3

## 2016-09-18 MED ORDER — PROPOFOL 10 MG/ML IV BOLUS
INTRAVENOUS | Status: DC | PRN
  Administered 2016-09-18: 60 mg via INTRAVENOUS
  Administered 2016-09-18: 200 mg via INTRAVENOUS

## 2016-09-18 MED ORDER — PROPOFOL 10 MG/ML IV BOLUS
INTRAVENOUS | Status: AC
  Filled 2016-09-18: qty 40

## 2016-09-18 MED ORDER — CEFAZOLIN SODIUM-DEXTROSE 2-4 GM/100ML-% IV SOLN
INTRAVENOUS | Status: AC
  Filled 2016-09-18: qty 100

## 2016-09-18 MED ORDER — DEXAMETHASONE SODIUM PHOSPHATE 4 MG/ML IJ SOLN
INTRAMUSCULAR | Status: DC | PRN
  Administered 2016-09-18: 10 mg via INTRAVENOUS

## 2016-09-18 MED ORDER — SODIUM CHLORIDE 0.9 % IV SOLN
250.0000 mL | INTRAVENOUS | Status: DC | PRN
  Filled 2016-09-18: qty 250

## 2016-09-18 MED ORDER — CEFAZOLIN SODIUM-DEXTROSE 2-4 GM/100ML-% IV SOLN
2.0000 g | INTRAVENOUS | Status: AC
  Administered 2016-09-18: 2 g via INTRAVENOUS
  Filled 2016-09-18: qty 100

## 2016-09-18 MED ORDER — FENTANYL CITRATE (PF) 100 MCG/2ML IJ SOLN
INTRAMUSCULAR | Status: AC
  Filled 2016-09-18: qty 2

## 2016-09-18 MED ORDER — SODIUM CHLORIDE 0.9% FLUSH
3.0000 mL | Freq: Two times a day (BID) | INTRAVENOUS | Status: DC
  Filled 2016-09-18: qty 3

## 2016-09-18 MED ORDER — EPHEDRINE SULFATE 50 MG/ML IJ SOLN
INTRAMUSCULAR | Status: DC | PRN
  Administered 2016-09-18: 10 mg via INTRAVENOUS

## 2016-09-18 MED ORDER — PROMETHAZINE HCL 25 MG/ML IJ SOLN
6.2500 mg | INTRAMUSCULAR | Status: DC | PRN
  Filled 2016-09-18: qty 1

## 2016-09-18 MED ORDER — ACETAMINOPHEN 650 MG RE SUPP
650.0000 mg | RECTAL | Status: DC | PRN
  Filled 2016-09-18: qty 1

## 2016-09-18 MED ORDER — ONDANSETRON HCL 4 MG/2ML IJ SOLN
INTRAMUSCULAR | Status: AC
  Filled 2016-09-18: qty 2

## 2016-09-18 MED ORDER — ONDANSETRON HCL 4 MG/2ML IJ SOLN
INTRAMUSCULAR | Status: DC | PRN
  Administered 2016-09-18: 4 mg via INTRAVENOUS

## 2016-09-18 SURGICAL SUPPLY — 91 items
APL SKNCLS STERI-STRIP NONHPOA (GAUZE/BANDAGES/DRESSINGS)
BAG DECANTER FOR FLEXI CONT (MISCELLANEOUS) IMPLANT
BANDAGE ACE 4X5 VEL STRL LF (GAUZE/BANDAGES/DRESSINGS) ×2 IMPLANT
BANDAGE ACE 6X5 VEL STRL LF (GAUZE/BANDAGES/DRESSINGS) ×2 IMPLANT
BANDAGE ELASTIC 3 VELCRO ST LF (GAUZE/BANDAGES/DRESSINGS) IMPLANT
BANDAGE ELASTIC 4 VELCRO ST LF (GAUZE/BANDAGES/DRESSINGS) IMPLANT
BANDAGE ELASTIC 6 VELCRO ST LF (GAUZE/BANDAGES/DRESSINGS) IMPLANT
BENZOIN TINCTURE PRP APPL 2/3 (GAUZE/BANDAGES/DRESSINGS) IMPLANT
BLADE MINI RND TIP GREEN BEAV (BLADE) IMPLANT
BLADE SURG 10 STRL SS (BLADE) ×3 IMPLANT
BLADE SURG 15 STRL LF DISP TIS (BLADE) ×1 IMPLANT
BLADE SURG 15 STRL SS (BLADE) ×3
BNDG CMPR 9X4 STRL LF SNTH (GAUZE/BANDAGES/DRESSINGS)
BNDG COHESIVE 1X5 TAN STRL LF (GAUZE/BANDAGES/DRESSINGS) IMPLANT
BNDG COHESIVE 4X5 TAN NS LF (GAUZE/BANDAGES/DRESSINGS) IMPLANT
BNDG ESMARK 4X9 LF (GAUZE/BANDAGES/DRESSINGS) IMPLANT
BNDG GAUZE ELAST 4 BULKY (GAUZE/BANDAGES/DRESSINGS) IMPLANT
CANISTER SUCT 3000ML PPV (MISCELLANEOUS) IMPLANT
CANISTER SUCTION 1200CC (MISCELLANEOUS) IMPLANT
CHLORAPREP W/TINT 26ML (MISCELLANEOUS) IMPLANT
CLOSURE WOUND 1/2 X4 (GAUZE/BANDAGES/DRESSINGS)
CLOTH BEACON ORANGE TIMEOUT ST (SAFETY) ×3 IMPLANT
CORDS BIPOLAR (ELECTRODE) IMPLANT
COVER BACK TABLE 60X90IN (DRAPES) ×3 IMPLANT
COVER MAYO STAND STRL (DRAPES) ×3 IMPLANT
DECANTER SPIKE VIAL GLASS SM (MISCELLANEOUS) IMPLANT
DRAIN PENROSE 18X1/2 LTX STRL (DRAIN) IMPLANT
DRAPE EXTREMITY TIBURON (DRAPES) IMPLANT
DRAPE INCISE IOBAN 66X45 STRL (DRAPES) IMPLANT
DRAPE LG THREE QUARTER DISP (DRAPES) IMPLANT
DRSG ADAPTIC 3X8 NADH LF (GAUZE/BANDAGES/DRESSINGS) ×2 IMPLANT
DRSG EMULSION OIL 3X3 NADH (GAUZE/BANDAGES/DRESSINGS) IMPLANT
DRSG KUZMA FLUFF (GAUZE/BANDAGES/DRESSINGS) ×2 IMPLANT
ELECT NDL BLADE 2-5/6 (NEEDLE) IMPLANT
ELECT NDL TIP 2.8 STRL (NEEDLE) IMPLANT
ELECT NEEDLE BLADE 2-5/6 (NEEDLE) IMPLANT
ELECT NEEDLE TIP 2.8 STRL (NEEDLE) IMPLANT
ELECT REM PT RETURN 9FT ADLT (ELECTROSURGICAL) ×3
ELECTRODE REM PT RTRN 9FT ADLT (ELECTROSURGICAL) ×1 IMPLANT
GAUZE SPONGE 4X4 12PLY STRL (GAUZE/BANDAGES/DRESSINGS) IMPLANT
GAUZE XEROFORM 1X8 LF (GAUZE/BANDAGES/DRESSINGS) IMPLANT
GAUZE XEROFORM 5X9 LF (GAUZE/BANDAGES/DRESSINGS) IMPLANT
GLOVE BIO SURGEON STRL SZ 6.5 (GLOVE) ×4 IMPLANT
GLOVE BIO SURGEONS STRL SZ 6.5 (GLOVE) ×2
GOWN STRL REUS W/ TWL XL LVL3 (GOWN DISPOSABLE) ×1 IMPLANT
GOWN STRL REUS W/TWL XL LVL3 (GOWN DISPOSABLE) ×3
HANDPIECE INTERPULSE COAX TIP (DISPOSABLE)
IV NS IRRIG 3000ML ARTHROMATIC (IV SOLUTION) IMPLANT
KIT RM TURNOVER CYSTO AR (KITS) ×3 IMPLANT
MANIFOLD NEPTUNE II (INSTRUMENTS) IMPLANT
NDL HYPO 30GX1 BEV (NEEDLE) IMPLANT
NEEDLE 27GAX1X1/2 (NEEDLE) IMPLANT
NEEDLE HYPO 30GX1 BEV (NEEDLE) IMPLANT
NS IRRIG 500ML POUR BTL (IV SOLUTION) ×3 IMPLANT
PACK BASIN DAY SURGERY FS (CUSTOM PROCEDURE TRAY) ×3 IMPLANT
PAD ABD 8X10 STRL (GAUZE/BANDAGES/DRESSINGS) IMPLANT
PADDING CAST ABS 3INX4YD NS (CAST SUPPLIES)
PADDING CAST ABS 4INX4YD NS (CAST SUPPLIES)
PADDING CAST ABS COTTON 3X4 (CAST SUPPLIES) IMPLANT
PADDING CAST ABS COTTON 4X4 ST (CAST SUPPLIES) IMPLANT
PENCIL BUTTON HOLSTER BLD 10FT (ELECTRODE) IMPLANT
SET HNDPC FAN SPRY TIP SCT (DISPOSABLE) IMPLANT
SLEEVE SCD COMPRESS KNEE MED (MISCELLANEOUS) IMPLANT
SPLINT PLASTER CAST XFAST 3X15 (CAST SUPPLIES) IMPLANT
SPLINT PLASTER XTRA FASTSET 3X (CAST SUPPLIES)
SPONGE LAP 18X18 X RAY DECT (DISPOSABLE) IMPLANT
SPONGE LAP 4X18 X RAY DECT (DISPOSABLE) IMPLANT
STAPLER VISISTAT 35W (STAPLE) IMPLANT
STOCKINETTE 4X48 STRL (DRAPES) IMPLANT
STRIP CLOSURE SKIN 1/2X4 (GAUZE/BANDAGES/DRESSINGS) IMPLANT
SUCTION FRAZIER HANDLE 10FR (MISCELLANEOUS)
SUCTION TUBE FRAZIER 10FR DISP (MISCELLANEOUS) IMPLANT
SURGILUBE 2OZ TUBE FLIPTOP (MISCELLANEOUS) ×3 IMPLANT
SUT ETHILON 3 0 PS 1 (SUTURE) IMPLANT
SUT ETHILON 4 0 P 3 18 (SUTURE) IMPLANT
SUT ETHILON 5 0 PS 2 18 (SUTURE) ×3 IMPLANT
SUT PROLENE 3 0 PS 2 (SUTURE) IMPLANT
SUT SILK 3 0 PS 1 (SUTURE) IMPLANT
SUT VIC AB 3-0 FS2 27 (SUTURE) IMPLANT
SUT VIC AB 5-0 PS2 18 (SUTURE) IMPLANT
SYR BULB IRRIGATION 50ML (SYRINGE) ×3 IMPLANT
SYR CONTROL 10ML LL (SYRINGE) ×3 IMPLANT
TAPE HYPAFIX 6 X30' (GAUZE/BANDAGES/DRESSINGS)
TAPE HYPAFIX 6X30 (GAUZE/BANDAGES/DRESSINGS) IMPLANT
TOWEL OR 17X24 6PK STRL BLUE (TOWEL DISPOSABLE) ×3 IMPLANT
TRAY DSU PREP LF (CUSTOM PROCEDURE TRAY) IMPLANT
TUBE CONNECTING 12'X1/4 (SUCTIONS) ×1
TUBE CONNECTING 12X1/4 (SUCTIONS) ×2 IMPLANT
UNDERPAD 30X30 INCONTINENT (UNDERPADS AND DIAPERS) ×3 IMPLANT
WATER STERILE IRR 500ML POUR (IV SOLUTION) ×3 IMPLANT
YANKAUER SUCT BULB TIP NO VENT (SUCTIONS) ×3 IMPLANT

## 2016-09-18 NOTE — Anesthesia Postprocedure Evaluation (Signed)
Anesthesia Post Note  Patient: [name]  Procedure(s) Performed: Procedure(s) (LRB): IRRIGATION AND DEBRIDEMENT OF RIGHT LATERAL DIABLTIC FOOT ULCER (Right) APPLICATION OF WOUND VAC (PT. WILL BRING HIS OWN) (Right)     Anesthesia Post Evaluation  Last Vitals:  Vitals:   09/18/16 1115 09/18/16 1145  BP: 112/69 128/80  Pulse: 64 66  Resp: (!) 8 12  Temp:  36.8 C  SpO2: 100% 100%    Last Pain:  Vitals:   09/18/16 0817  TempSrc: Oral                 Landrie Beale,JAMES TERRILL

## 2016-09-18 NOTE — Discharge Instructions (Signed)
Mechanical Wound Debridement Mechanical wound debridement is a treatment to remove dead tissue from a wound. This helps the wound heal. The treatment involves cleaning the wound (irrigation) and using a pad or gauze (dressing) to remove dead tissue and debris from the wound. There are different types of mechanical wound debridement. Depending on the wound, you may need to repeat this procedure or change to another form of debridement as your wound starts to heal. Tell a health care provider about:  Any allergies you have.  All medicines you are taking, including vitamins, herbs, eye drops, creams, and over-the-counter medicines.  Any blood disorders you have.  Any medical conditions you have, including any conditions that: ? Cause a significant decrease in blood circulation to the part of the body where the wound is, such as peripheral vascular disease. ? Compromise your defense (immune) system or white blood count.  Any surgeries you have had.  Whether you are pregnant or may be pregnant. What are the risks? Generally, this is a safe procedure. However, problems may occur, including:  Infection.  Bleeding.  Damage to healthy tissue in and around your wound.  Soreness or pain.  Failure of the wound to heal.  Scarring.  What happens before the procedure? You may be given antibiotic medicine to help prevent infection. What happens during the procedure?  Your health care provider may apply a numbing medicine (topical anesthetic) to the wound.  Your health care provider will irrigate your wound with a germ-free (sterile), salt-water (saline) solution. This removes debris, bacteria, and dead tissue.  Depending on what type of mechanical wound debridement you are having, your health care provider may do one of the following: ? Put a dressing on your wound. You may have dry gauze pad placed into the wound. Your health care provider will remove the gauze after the wound is dry. Any  dead tissue and debris that has dried into the gauze will be lifted out of the wound (wet-to-dry debridement). ? Use a type of pad (monofilament fiber debridement pad). This pad has a fluffy surface on one side that picks up dead tissue and debris from your wound. Your health care provider wets the pad and wipes it over your wound for several minutes. ? Irrigate your wound with a pressurized stream of solution such as saline or water.  Once your health care provider is finished, he or she may apply a light dressing to your wound. The procedure may vary among health care providers and hospitals. What happens after the procedure?  You may receive medicine for pain.  You will continue to receive antibiotic medicine if it was started before your procedure. This information is not intended to replace advice given to you by your health care provider. Make sure you discuss any questions you have with your health care provider. Document Released: 09/27/2014 Document Revised: 06/14/2015 Document Reviewed: 05/17/2014 Elsevier Interactive Patient Education  2018 [place] [facility] Home Care Instructions  Activity: Get plenty of rest for the remainder of the day. A responsible individual must stay with you for 24 hours following the procedure.  For the next 24 hours, DO NOT: -Drive a car -Paediatric nurse -Drink alcoholic beverages -Take any medication unless instructed by your physician -Make any legal decisions or sign important papers.  Meals: Start with liquid foods such as gelatin or soup. Progress to regular foods as tolerated. Avoid greasy, spicy, heavy foods. If nausea and/or vomiting occur, drink only clear liquids until the  nausea and/or vomiting subsides. Call your physician if vomiting continues.  Special Instructions/Symptoms: Your throat may feel dry or sore from the anesthesia or the breathing tube placed in your throat during surgery. If this causes discomfort,  gargle with warm salt water. The discomfort should disappear within 24 hours.  If you had a scopolamine patch placed behind your ear for the management of post- operative nausea and/or vomiting:  1. The medication in the patch is effective for 72 hours, after which it should be removed.  Wrap patch in a tissue and discard in the trash. Wash hands thoroughly with soap and water. 2. You may remove the patch earlier than 72 hours if you experience unpleasant side effects which may include dry mouth, dizziness or visual disturbances. 3. Avoid touching the patch. Wash your hands with soap and water after contact with the patch.   Call your surgeon if you experience:   1.  Fever over 101.0. 2.  Inability to urinate. 3.  Nausea and/or vomiting. 4.  Extreme swelling or bruising at the surgical site. 5.  Continued bleeding from the incision. 6.  Increased pain, redness or drainage from the incision. 7.  Problems related to your pain medication. 8.  Any problems and/or concerns

## 2016-09-18 NOTE — Op Note (Signed)
DATE OF OPERATION: 09/18/2016  LOCATION: [facility] outpatient Operating Room  PREOPERATIVE DIAGNOSIS: right foot chronic wound  POSTOPERATIVE DIAGNOSIS: Same  PROCEDURE: Excisional debridement of right foot wound 5 x 10 cm skin, soft tissue and muscle. Placement of the VAC.  SURGEON: Jodette Wik Sanger Maicie Vanderloop, DO  ASSISTANT: Shawn Rayburn, PA  EBL: 10 cc  CONDITION: Stable  COMPLICATIONS: None  INDICATION: The patient, [name], is a 46 y.o. male born on Dec 16, 1970, is here for treatment of a chronic diabetic foot wound on the right.   PROCEDURE DETAILS:  The patient was seen prior to surgery and marked.  The IV antibiotics were given. The patient was taken to the operating room and given a general anesthetic. A standard time out was performed and all information was confirmed by those in the room. SCD was placed on the left leg.   The leg was prepped and draped with betadine.  There was some incorporation of the Acell with granulation tissue at the base.  The foot was irrigated with antibiotic solution.  The #10 blade was used to excise the 5 x 10 cm area of nonviable skin, soft tissue and muscle.  Hemostasis was achieved with electrocautery.  The VAC was applied and there was an excellent seal.  The patient was allowed to wake up and taken to recovery room in stable condition at the end of the case. The family was notified at the end of the case.

## 2016-09-18 NOTE — H&P (Signed)
[name] is an 46 y.o. male.   Chief Complaint: right foot wound HPI: The patient is a 46 yrs old bm here for treatment of a right foot wound.  He has been treating it with the VAC at home.  He underwent debridement with Acell placement in the past.  He is here for further treatment of his foot wound.  Past Medical History:  Diagnosis Date  . CKD (chronic kidney disease), stage III    nephrologist-  dr [name] ([facility] in [place])    . Diabetic foot ulcer with osteomyelitis ([place])    right lateral foot--- s/p  amputation 4th and 5th ray , ACELL , Wound VAC  . Diabetic peripheral neuropathy ([place])   . Hypertension   . Iron deficiency anemia   . Mixed hyperlipidemia   . Type 2 diabetes mellitus treated with insulin ([place])    last A1c 5.3 on 07-29-2016    Past Surgical History:  Procedure Laterality Date  . AMPUTATION Right 07/31/2016   Procedure: AMPUTATION RIGHT FIFTH RAY AND APPLICATION OF WOUND VAC;  Surgeon: Dorna Leitz, MD;  Location: [facility] ORS;  Service: Orthopedics;  Laterality: Right;  . AMPUTATION Right 08/08/2016   Procedure: RAY AMPUTATION RIGHT FOURTH METATARSAL, DEBRIDEMENT OF SUBQ TISSUE,BONE AND SKIN WITH WOUND CLOSURE;  Surgeon: Dorna Leitz, MD;  Location: [facility] ORS;  Service: Orthopedics;  Laterality: Right;  . ANAL FISSURE REPAIR  2000  . APPLICATION OF A-CELL OF EXTREMITY Right 08/13/2016   Procedure: APPLICATION OF A-CELL AND VAC;  Surgeon: Wallace Going, DO;  Location: [facility] ORS;  Service: Plastics;  Laterality: Right;  . CIRCUMCISION  2006  . INCISION AND DRAINAGE OF WOUND Right 08/13/2016   Procedure: IRRIGATION AND DEBRIDEMENT WOUND RIGHT FOOT;  Surgeon: Wallace Going, DO;  Location: [facility] ORS;  Service: Plastics;  Laterality: Right;  . LAPAROSCOPIC CHOLECYSTECTOMY  09-09-2006   dr Zella Richer  . wisdom teeth extracted  1984    Family History  Problem Relation Age of Onset  . Diabetes Father    Social History:  reports that he has  never smoked. He has never used smokeless tobacco. He reports that he does not drink alcohol or use drugs.  Allergies: No Known Allergies  Medications Prior to Admission  Medication Sig Dispense Refill  . aspirin EC 81 MG tablet Take 81 mg by mouth daily.    Marland Kitchen atorvastatin (LIPITOR) 40 MG tablet Take 40 mg by mouth every morning.     . Cyanocobalamin (VITAMIN B-12) 5000 MCG SUBL Place 5,000 mcg under the tongue daily.    . feeding supplement, GLUCERNA SHAKE, (GLUCERNA SHAKE) LIQD Take 237 mLs by mouth 2 (two) times daily between meals. 60 Can 0  . fenofibrate (TRICOR) 48 MG tablet Take 1 tablet (48 mg total) by mouth daily. (Patient taking differently: Take 48 mg by mouth every morning. ) 30 tablet 0  . ferrous sulfate 325 (65 FE) MG tablet Take 1 tablet (325 mg total) by mouth 2 (two) times daily with a meal. 60 tablet 3  . hydrochlorothiazide (MICROZIDE) 12.5 MG capsule Take 12.5 mg by mouth every morning.   3  . insulin glargine (LANTUS) 100 UNIT/ML injection Inject 0.2 mLs (20 Units total) into the skin at bedtime. 10 mL 11  . insulin lispro (HUMALOG) 100 UNIT/ML injection Inject 0.04 mLs (4 Units total) into the skin 2 (two) times daily. 10 mL 11  . Multiple Vitamin (MULTIVITAMIN WITH MINERALS) TABS tablet Take 1 tablet by  mouth 2 (two) times daily.    . Omega-3 Fatty Acids (FISH OIL) 1360 MG CAPS Take 1 capsule by mouth every morning.    . ramipril (ALTACE) 5 MG capsule Take 5 mg by mouth every morning.     . sitaGLIPtin (JANUVIA) 50 MG tablet Take 50 mg by mouth every morning.     . vitamin C (VITAMIN C) 500 MG tablet Take 1 tablet (500 mg total) by mouth daily. 30 tablet 0  . acetaminophen (TYLENOL) 325 MG tablet Take 2 tablets (650 mg total) by mouth every 6 (six) hours as needed for mild pain (or Fever >/= 101). 30 tablet 0    Results for orders placed or performed during the hospital encounter of 09/18/16 (from the past 48 hour(s))  I-STAT, chem 8     Status: Abnormal   Collection  Time: 09/18/16  8:53 AM  Result Value Ref Range   Sodium 144 135 - 145 mmol/L   Potassium 4.2 3.5 - 5.1 mmol/L   Chloride 104 101 - 111 mmol/L   BUN 23 (H) 6 - 20 mg/dL   Creatinine, Ser 1.40 (H) 0.61 - 1.24 mg/dL   Glucose, Bld 135 (H) 65 - 99 mg/dL   Calcium, Ion 1.29 1.15 - 1.40 mmol/L   TCO2 27 22 - 32 mmol/L   Hemoglobin 12.6 (L) 13.0 - 17.0 g/dL   HCT 37.0 (L) 39.0 - 52.0 %   No results found.  Review of Systems  Constitutional: Negative.   HENT: Negative.   Eyes: Negative.   Respiratory: Negative.   Cardiovascular: Negative.   Gastrointestinal: Negative.   Genitourinary: Negative.   Musculoskeletal: Negative.   Skin: Negative.   Neurological: Negative.   Psychiatric/Behavioral: Negative.     Blood pressure 115/72, pulse 73, temperature 98.4 F (36.9 C), temperature source Oral, resp. rate 18, height 6' (1.829 m), weight 116.1 kg (256 lb), SpO2 100 %. Physical Exam  Constitutional: He is oriented to person, place, and time. He appears well-developed and well-nourished.  HENT:  Head: Normocephalic and atraumatic.  Eyes: Pupils are equal, round, and reactive to light. EOM are normal.  Cardiovascular: Normal rate.   Respiratory: Effort normal.  GI: Soft.  Musculoskeletal:       Feet:  Neurological: He is alert and oriented to person, place, and time.  Skin: Skin is warm.  Psychiatric: He has a normal mood and affect. His behavior is normal. Judgment and thought content normal.     Assessment/Plan Debridement of right foot wound and placement of the VAC.  Wallace Going, DO 09/18/2016, 9:38 AM

## 2016-09-18 NOTE — Anesthesia Procedure Notes (Signed)
Procedure Name: LMA Insertion Date/Time: 09/18/2016 9:52 AM Performed by: Wanita Chamberlain Pre-anesthesia Checklist: Patient identified, Emergency Drugs available, Suction available, Patient being monitored and Timeout performed Patient Re-evaluated:Patient Re-evaluated prior to induction Oxygen Delivery Method: Circle system utilized Preoxygenation: Pre-oxygenation with 100% oxygen Induction Type: IV induction Ventilation: Mask ventilation without difficulty LMA: LMA inserted LMA Size: 5.0 Number of attempts: 1 Placement Confirmation: breath sounds checked- equal and bilateral and CO2 detector Tube secured with: Tape Dental Injury: Teeth and Oropharynx as per pre-operative assessment

## 2016-09-18 NOTE — Transfer of Care (Signed)
Immediate Anesthesia Transfer of Care Note  Patient: [name]  Procedure(s) Performed: Procedure(s): IRRIGATION AND DEBRIDEMENT OF RIGHT LATERAL DIABLTIC FOOT ULCER (Right) APPLICATION OF WOUND VAC (PT. WILL BRING HIS OWN) (Right)  Patient Location: PACU  Anesthesia Type:General  Level of Consciousness: awake, alert , oriented and patient cooperative  Airway & Oxygen Therapy: Patient Spontanous Breathing and Patient connected to nasal cannula oxygen  Post-op Assessment: Report given to RN and Post -op Vital signs reviewed and stable  Post vital signs: Reviewed and stable  Last Vitals:  Vitals:   09/18/16 0817  BP: 115/72  Pulse: 73  Resp: 18  Temp: 36.9 C  SpO2: 100%    Last Pain:  Vitals:   09/18/16 0817  TempSrc: Oral      Patients Stated Pain Goal: 2 (55/[id number]/41 6384)  Complications: No apparent anesthesia complications

## 2016-09-18 NOTE — Anesthesia Preprocedure Evaluation (Addendum)
Anesthesia Evaluation  Patient identified by MRN, date of birth, ID band Patient awake    Reviewed: Allergy & Precautions, NPO status , Patient's Chart, lab work & pertinent test results  Airway Mallampati: II  TM Distance: >3 FB Neck ROM: Full    Dental no notable dental hx. (+) Teeth Intact, Dental Advisory Given   Pulmonary    breath sounds clear to auscultation       Cardiovascular hypertension,  Rhythm:Regular Rate:Normal     Neuro/Psych  Neuromuscular disease    GI/Hepatic negative GI ROS, Neg liver ROS,   Endo/Other  diabetes, Poorly ControlledMorbid obesity  Renal/GU Renal InsufficiencyRenal disease     Musculoskeletal   Abdominal   Peds  Hematology   Anesthesia Other Findings   Reproductive/Obstetrics                           Anesthesia Physical Anesthesia Plan  ASA: III  Anesthesia Plan: General   Post-op Pain Management:    Induction: Intravenous  PONV Risk Score and Plan: 3 and Ondansetron, Dexamethasone, Midazolam and Propofol infusion  Airway Management Planned: LMA  Additional Equipment:   Intra-op Plan:   Post-operative Plan: Extubation in OR  Informed Consent: I have reviewed the patients History and Physical, chart, labs and discussed the procedure including the risks, benefits and alternatives for the proposed anesthesia with the patient or authorized representative who has indicated his/her understanding and acceptance.     Plan Discussed with: CRNA  Anesthesia Plan Comments:         Anesthesia Quick Evaluation

## 2016-09-18 NOTE — Anesthesia Postprocedure Evaluation (Signed)
Anesthesia Post Note  Patient: [name]  Procedure(s) Performed: Procedure(s) (LRB): IRRIGATION AND DEBRIDEMENT OF RIGHT LATERAL DIABLTIC FOOT ULCER (Right) APPLICATION OF WOUND VAC (PT. WILL BRING HIS OWN) (Right)     Patient location during evaluation: PACU Anesthesia Type: General Level of consciousness: awake and alert Pain management: pain level controlled Vital Signs Assessment: post-procedure vital signs reviewed and stable Respiratory status: spontaneous breathing, nonlabored ventilation, respiratory function stable and patient connected to nasal cannula oxygen Cardiovascular status: blood pressure returned to baseline and stable Postop Assessment: no signs of nausea or vomiting Anesthetic complications: no    Last Vitals:  Vitals:   09/18/16 1115 09/18/16 1145  BP: 112/69 128/80  Pulse: 64 66  Resp: (!) 8 12  Temp:  36.8 C  SpO2: 100% 100%    Last Pain:  Vitals:   09/18/16 0817  TempSrc: Oral                 Chudney Scheffler,JAMES TERRILL

## 2016-09-19 ENCOUNTER — Encounter (HOSPITAL_BASED_OUTPATIENT_CLINIC_OR_DEPARTMENT_OTHER): Payer: Self-pay | Admitting: Plastic Surgery

## 2016-09-24 ENCOUNTER — Encounter (HOSPITAL_BASED_OUTPATIENT_CLINIC_OR_DEPARTMENT_OTHER): Payer: Self-pay | Admitting: Plastic Surgery

## 2016-09-26 ENCOUNTER — Other Ambulatory Visit: Payer: Self-pay | Admitting: *Deleted

## 2016-09-26 DIAGNOSIS — E11621 Type 2 diabetes mellitus with foot ulcer: Secondary | ICD-10-CM | POA: Diagnosis not present

## 2016-09-26 DIAGNOSIS — M869 Osteomyelitis, unspecified: Secondary | ICD-10-CM | POA: Diagnosis not present

## 2016-09-26 DIAGNOSIS — E1142 Type 2 diabetes mellitus with diabetic polyneuropathy: Secondary | ICD-10-CM | POA: Diagnosis not present

## 2016-09-26 DIAGNOSIS — Z794 Long term (current) use of insulin: Secondary | ICD-10-CM | POA: Diagnosis not present

## 2016-09-26 DIAGNOSIS — E1169 Type 2 diabetes mellitus with other specified complication: Secondary | ICD-10-CM | POA: Diagnosis not present

## 2016-09-26 DIAGNOSIS — N183 Chronic kidney disease, stage 3 (moderate): Secondary | ICD-10-CM | POA: Diagnosis not present

## 2016-09-26 DIAGNOSIS — E1122 Type 2 diabetes mellitus with diabetic chronic kidney disease: Secondary | ICD-10-CM | POA: Diagnosis not present

## 2016-09-26 DIAGNOSIS — L97509 Non-pressure chronic ulcer of other part of unspecified foot with unspecified severity: Secondary | ICD-10-CM | POA: Diagnosis not present

## 2016-09-26 NOTE — Patient Outreach (Signed)
Received call from [name] at 10:55 am today asking for assist in ensuring [name]'s 3x weekly VAC wound dressings via the [facility]) nurse are provided as no dressing change was done to his foot since irrigation and debridement and re- application of the wound VAC was done as OP surgery on 09/18/16 despite orders. Spoke with [name] at [facility] (covering for [employer]) at [phone]  and requested she contact [name] at [phone] for follow up and to ensure dressing changes will take place next week. Spoke with patient at 3:47 pm to verify he had received call from  [facility] about dressing changes for next week and then spoke with [name] at 3:52 pm to verify that she spoke with [facility] about her concerns. Also, emailed [name] the [facility] CM supply list with costs should he need additional dressing supplies in the future.  Will continue to monitor [name]'s diabetes self management skills via the [facility] platform. Barrington Ellison RN,CCM,CDE [place] Management Coordinator [facility] [employer] [phone] Office Fax [phone] .

## 2016-10-07 ENCOUNTER — Ambulatory Visit: Payer: Self-pay | Admitting: Family Medicine

## 2016-10-07 ENCOUNTER — Other Ambulatory Visit: Payer: Self-pay

## 2016-10-07 ENCOUNTER — Other Ambulatory Visit: Payer: Self-pay | Admitting: *Deleted

## 2016-10-07 DIAGNOSIS — I70239 Atherosclerosis of native arteries of right leg with ulceration of unspecified site: Secondary | ICD-10-CM

## 2016-10-07 NOTE — Patient Outreach (Signed)
Attempted to reach [name]

## 2016-10-07 NOTE — Patient Outreach (Signed)
Attempted to reach [facility] via cell phone; left message requesting her return call to this [facility] regarding lack of data in [employer] and no response to the urgent message sent to him in [place] on 9/13 regarding home health.  Await return call. Barrington Ellison RN,CCM,CDE [place] Management Coordinator [facility] [employer] [phone] Office Fax [phone]

## 2016-10-21 ENCOUNTER — Ambulatory Visit (HOSPITAL_COMMUNITY)
Admission: RE | Admit: 2016-10-21 | Discharge: 2016-10-21 | Disposition: A | Discharge status: Home / Self Care | Payer: Managed Care, Other (non HMO) | Source: Ambulatory Visit | Attending: Vascular Surgery | Admitting: Vascular Surgery

## 2016-10-21 DIAGNOSIS — I70239 Atherosclerosis of native arteries of right leg with ulceration of unspecified site: Secondary | ICD-10-CM | POA: Diagnosis not present

## 2016-10-22 ENCOUNTER — Encounter: Payer: Self-pay | Admitting: Physical Therapy

## 2016-10-22 ENCOUNTER — Encounter: Payer: Self-pay | Admitting: Vascular Surgery

## 2016-10-22 ENCOUNTER — Ambulatory Visit (INDEPENDENT_AMBULATORY_CARE_PROVIDER_SITE_OTHER): Payer: Managed Care, Other (non HMO) | Admitting: Vascular Surgery

## 2016-10-22 ENCOUNTER — Other Ambulatory Visit: Payer: Self-pay | Admitting: *Deleted

## 2016-10-22 ENCOUNTER — Encounter: Payer: Self-pay | Admitting: *Deleted

## 2016-10-22 VITALS — BP 96/73 | HR 76 | Temp 97.7°F | Resp 16 | Ht 72.0 in | Wt 257.8 lb

## 2016-10-22 DIAGNOSIS — L97909 Non-pressure chronic ulcer of unspecified part of unspecified lower leg with unspecified severity: Secondary | ICD-10-CM | POA: Diagnosis not present

## 2016-10-22 DIAGNOSIS — I70299 Other atherosclerosis of native arteries of extremities, unspecified extremity: Secondary | ICD-10-CM

## 2016-10-22 NOTE — Progress Notes (Addendum)
Patient name: [name] MRN: [id number] DOB: 1970/12/01 Sex: male   REASON FOR CONSULT:    Diabetic foot ulcer right foot. The consult is requested by Dr.Dillingham.  HPI:   [name] is a pleasant 46 y.o. male,  Referred for evaluation of a diabetic foot ulcer. I have reviewed the records that were sent by Dr. Marla Roe. The patient has a wound on the right lateral foot and was seen in follow up on 10/06/2016 after placement of an Acell graft. He had previously been hospitalized with osteomyelitis of the right foot and underwent amputation of fourth and fifth toes by Dr. Berenice Primas. The patient reportedly had normal ABIs when he was hospitalized at [facility].  The patient does have type 2 diabetes and also stage III chronic kidney disease. The patient has polyneuropathy associated with her diabetes.  Prior to developing this wound on his right foot, he denies any history of claudication, rest pain, or previous nonhealing ulcers. He does not remember any specific injury to his foot.  Currently the wound is being addressed with a VAC.  Past Medical History:  Diagnosis Date  . CKD (chronic kidney disease), stage III [facility])    nephrologist-  dr [name] ([facility] in [place])    . Diabetic foot ulcer with osteomyelitis ([place])    right lateral foot--- s/p  amputation 4th and 5th ray , ACELL , Wound VAC  . Diabetic peripheral neuropathy ([place])   . Hypertension   . Iron deficiency anemia   . Mixed hyperlipidemia   . Type 2 diabetes mellitus treated with insulin ([place])    last A1c 5.3 on 07-29-2016    Family History  Problem Relation Age of Onset  . Diabetes Father     SOCIAL HISTORY: He is not a smoker. Social History   Social History  . Marital status: Married    Spouse name: N/A  . Number of children: N/A  . Years of education: N/A   Occupational History  . Not on file.   Social History Main Topics  . Smoking status: Never Smoker  .  Smokeless tobacco: Never Used  . Alcohol use No  . Drug use: No  . Sexual activity: Not on file   Other Topics Concern  . Not on file   Social History Narrative  . No narrative on file    No Known Allergies  Current Outpatient Prescriptions  Medication Sig Dispense Refill  . aspirin EC 81 MG tablet Take 81 mg by mouth daily.    Marland Kitchen atorvastatin (LIPITOR) 40 MG tablet Take 40 mg by mouth every morning.     . Cyanocobalamin (VITAMIN B-12) 5000 MCG SUBL Place 5,000 mcg under the tongue daily.    . feeding supplement, GLUCERNA SHAKE, (GLUCERNA SHAKE) LIQD Take 237 mLs by mouth 2 (two) times daily between meals. 60 Can 0  . fenofibrate (TRICOR) 48 MG tablet Take 1 tablet (48 mg total) by mouth daily. (Patient taking differently: Take 48 mg by mouth every morning. ) 30 tablet 0  . ferrous sulfate 325 (65 FE) MG tablet Take 1 tablet (325 mg total) by mouth 2 (two) times daily with a meal. 60 tablet 3  . hydrochlorothiazide (MICROZIDE) 12.5 MG capsule Take 12.5 mg by mouth every morning.   3  . insulin glargine (LANTUS) 100 UNIT/ML injection Inject 0.2 mLs (20 Units total) into the skin at bedtime. 10 mL 11  . insulin lispro (HUMALOG) 100 UNIT/ML injection Inject  0.04 mLs (4 Units total) into the skin 2 (two) times daily. 10 mL 11  . Multiple Vitamin (MULTIVITAMIN WITH MINERALS) TABS tablet Take 1 tablet by mouth 2 (two) times daily.    . Omega-3 Fatty Acids (FISH OIL) 1360 MG CAPS Take 1 capsule by mouth every morning.    . ramipril (ALTACE) 5 MG capsule Take 5 mg by mouth every morning.     . sitaGLIPtin (JANUVIA) 50 MG tablet Take 50 mg by mouth every morning.     . vitamin C (VITAMIN C) 500 MG tablet Take 1 tablet (500 mg total) by mouth daily. 30 tablet 0   No current facility-administered medications for this visit.     REVIEW OF SYSTEMS:  [X]  denotes positive finding, [ ]  denotes negative finding Cardiac  Comments:  Chest pain or chest pressure:    Shortness of breath upon  exertion:    Short of breath when lying flat:    Irregular heart rhythm:        Vascular    Pain in calf, thigh, or hip brought on by ambulation:    Pain in feet at night that wakes you up from your sleep:     Blood clot in your veins:    Leg swelling:         Pulmonary    Oxygen at home:    Productive cough:     Wheezing:         Neurologic    Sudden weakness in arms or legs:     Sudden numbness in arms or legs:     Sudden onset of difficulty speaking or slurred speech:    Temporary loss of vision in one eye:     Problems with dizziness:         Gastrointestinal    Blood in stool:     Vomited blood:         Genitourinary    Burning when urinating:     Blood in urine:        Psychiatric    Major depression:         Hematologic    Bleeding problems:    Problems with blood clotting too easily:        Skin    Rashes or ulcers:        Constitutional    Fever or chills:     PHYSICAL EXAM:   Vitals:   10/22/16 1012  BP: 96/73  Pulse: 76  Resp: 16  Temp: 97.7 F (36.5 C)  TempSrc: Oral  SpO2: 99%  Weight: 257 lb 12.8 oz (116.9 kg)  Height: 6' (1.829 m)    GENERAL: The patient is a well-nourished male, in no acute distress. The vital signs are documented above. CARDIAC: There is a regular rate and rhythm.  VASCULAR: I do not detect carotid bruits. On the right side, which is the site of concern, he has a palpable femoral and popliteal pulse. I cannot palpate pedal pulses. He has a biphasic posterior tibial signal with the Doppler and a monophasic dorsalis pedis and anterior tibial signal with the Doppler. On the left side he has a palpable femoral, popliteal, dorsalis pedis, and posterior tibial pulse. He has mild right lower extremity swelling. PULMONARY: There is good air exchange bilaterally without wheezing or rales. ABDOMEN: Soft and non-tender with normal pitched bowel sounds.  MUSCULOSKELETAL: There are no major deformities or cyanosis. NEUROLOGIC: No  focal weakness or paresthesias are detected. SKIN: He has an  extensive wound along the lateral aspect of his right foot with reasonably healthy-appearing granulation tissue although he still has some fibrous tissue. PSYCHIATRIC: The patient has a normal affect.  DATA:    LABS: Creatinine was 1.4 on 09/18/2016. On 08/13/2016, GFR was greater than 60.  ARTERIAL DOPPLER STUDY: The patient did have an arterial Doppler study when he was hospitalized at [facility]. On 07/29/2016 were triphasic Doppler signals in the dorsalis pedis and posterior tibial positions bilaterally. ABI on the right was 100%. ABI on the left was 100%.  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 Given the extent of the wound and his history of diabetes I think arteriography is indicated.   I have reviewed with the patient the indications for arteriography. In addition, I have reviewed the potential complications of arteriography including but not limited to: Bleeding, arterial injury, arterial thrombosis, dye action, renal insufficiency, or other unpredictable medical problems. I have explained to the patient that if we find disease amenable to angioplasty we could potentially address this at the same time. I have discussed the potential complications of angioplasty and stenting, including but not limited to: Bleeding, arterial thrombosis, arterial injury, dissection, or the need for surgical intervention.  According to the records he does have stage III chronic kidney disease however his most recent labs looked good. Regardless, I will use limited contrast and cannulate the left side and study the right leg only. He doesn't have any evidence of inflow disease. Certainly if he has disease amenable to angioplasty this could potentially be addressed at the same time. Fortunately he is not a  smoker. I would agree with continuing the VAC. I'll make further recommendations from a vascular standpoint pending the results of his arteriogram.  His arteriogram is scheduled for 10/27/2016.   Deitra Mayo [facility] [phone]

## 2016-10-23 ENCOUNTER — Encounter (HOSPITAL_BASED_OUTPATIENT_CLINIC_OR_DEPARTMENT_OTHER): Payer: Self-pay | Admitting: *Deleted

## 2016-10-24 ENCOUNTER — Ambulatory Visit: Payer: Self-pay | Admitting: Plastic Surgery

## 2016-10-24 DIAGNOSIS — S91301D Unspecified open wound, right foot, subsequent encounter: Secondary | ICD-10-CM

## 2016-10-24 NOTE — H&P (Signed)
[name] is an 46 y.o. male.   Chief Complaint: right foot wound HPI: The patient is a 46 y.o. yrs old bm here for his right foot wound from 7/25.  He has been getting the VAC changed 2-3 times per week.  The distal aspect is healing well with very good granulation tissue.  The proximal area has nonviable fat.  He had his 4 th and 5 th toe amputated with Dr. Berenice Primas in July. He has a vascular consult next week for further evaluation.  The area does not look infected at this time.  Past Medical History:  Diagnosis Date  . CKD (chronic kidney disease), stage III [facility])    nephrologist-  dr [name] ([facility] in [place])    . Diabetic foot ulcer with osteomyelitis ([place])    right lateral foot--- s/p  amputation 4th and 5th ray , ACELL , Wound VAC  . Diabetic peripheral neuropathy ([place])   . Hypertension   . Iron deficiency anemia   . Mixed hyperlipidemia   . Type 2 diabetes mellitus treated with insulin ([place])    last A1c 5.3 on 07-29-2016    Past Surgical History:  Procedure Laterality Date  . AMPUTATION Right 07/31/2016   Procedure: AMPUTATION RIGHT FIFTH RAY AND APPLICATION OF WOUND VAC;  Surgeon: Dorna Leitz, MD;  Location: [facility];  Service: Orthopedics;  Laterality: Right;  . AMPUTATION Right 08/08/2016   Procedure: RAY AMPUTATION RIGHT FOURTH METATARSAL, DEBRIDEMENT OF SUBQ TISSUE,BONE AND SKIN WITH WOUND CLOSURE;  Surgeon: Dorna Leitz, MD;  Location: [facility];  Service: Orthopedics;  Laterality: Right;  . ANAL FISSURE REPAIR  2000  . APPLICATION OF A-CELL OF EXTREMITY Right 08/13/2016   Procedure: APPLICATION OF A-CELL AND VAC;  Surgeon: Wallace Going, DO;  Location: [facility];  Service: Plastics;  Laterality: Right;  . APPLICATION OF WOUND VAC Right 09/18/2016   Procedure: APPLICATION OF WOUND VAC (PT. WILL BRING HIS OWN);  Surgeon: Wallace Going, DO;  Location: [facility];  Service: Plastics;  Laterality: Right;  . CIRCUMCISION  2006  .  I&D EXTREMITY Right 09/18/2016   Procedure: IRRIGATION AND DEBRIDEMENT OF RIGHT LATERAL DIABLTIC FOOT ULCER;  Surgeon: Wallace Going, DO;  Location: [facility];  Service: Plastics;  Laterality: Right;  . INCISION AND DRAINAGE OF WOUND Right 08/13/2016   Procedure: IRRIGATION AND DEBRIDEMENT WOUND RIGHT FOOT;  Surgeon: Wallace Going, DO;  Location: [facility];  Service: Plastics;  Laterality: Right;  . LAPAROSCOPIC CHOLECYSTECTOMY  09-09-2006   dr Zella Richer  . wisdom teeth extracted  1984    Family History  Problem Relation Age of Onset  . Diabetes Father    Social History:  reports that he has never smoked. He has never used smokeless tobacco. He reports that he does not drink alcohol or use drugs.  Allergies: No Known Allergies   (Not in a hospital admission)  No results found for this or any previous visit (from the past 48 hour(s)). No results found.  Review of Systems  Constitutional: Negative.   HENT: Negative.   Eyes: Negative.   Respiratory: Negative.   Cardiovascular: Negative.   Gastrointestinal: Negative.   Genitourinary: Negative.   Musculoskeletal: Negative.   Skin: Negative.   Neurological: Negative.   Psychiatric/Behavioral: Negative.     There were no vitals taken for this visit. Physical Exam  Constitutional: He is oriented to person, place, and time. He appears well-developed and well-nourished.  HENT:  Head: Normocephalic and atraumatic.  Eyes: Pupils are equal, round, and reactive to light. EOM are normal.  Cardiovascular: Normal rate.   Respiratory: Effort normal.  GI: Soft.  Neurological: He is alert and oriented to person, place, and time.  Skin: Skin is warm.  Psychiatric: He has a normal mood and affect. His behavior is normal. Judgment and thought content normal.     Assessment/Plan Debridement of right foot with Acell and VAC placement.  Wallace Going, DO 10/24/2016, 2:20 PM

## 2016-10-27 ENCOUNTER — Ambulatory Visit (HOSPITAL_COMMUNITY)
Admission: RE | Disposition: A | Discharge status: Home / Self Care | Payer: Self-pay | Source: Ambulatory Visit | Attending: Vascular Surgery

## 2016-10-27 ENCOUNTER — Ambulatory Visit (HOSPITAL_COMMUNITY)
Admission: RE | Admit: 2016-10-27 | Discharge: 2016-10-27 | Disposition: A | Discharge status: Home / Self Care | Payer: Managed Care, Other (non HMO) | Source: Ambulatory Visit | Attending: Vascular Surgery | Admitting: Vascular Surgery

## 2016-10-27 ENCOUNTER — Encounter (HOSPITAL_COMMUNITY): Payer: Self-pay | Admitting: Vascular Surgery

## 2016-10-27 DIAGNOSIS — Z7982 Long term (current) use of aspirin: Secondary | ICD-10-CM | POA: Diagnosis not present

## 2016-10-27 DIAGNOSIS — I129 Hypertensive chronic kidney disease with stage 1 through stage 4 chronic kidney disease, or unspecified chronic kidney disease: Secondary | ICD-10-CM | POA: Diagnosis not present

## 2016-10-27 DIAGNOSIS — N183 Chronic kidney disease, stage 3 (moderate): Secondary | ICD-10-CM | POA: Diagnosis not present

## 2016-10-27 DIAGNOSIS — Z79899 Other long term (current) drug therapy: Secondary | ICD-10-CM | POA: Insufficient documentation

## 2016-10-27 DIAGNOSIS — E1122 Type 2 diabetes mellitus with diabetic chronic kidney disease: Secondary | ICD-10-CM | POA: Insufficient documentation

## 2016-10-27 DIAGNOSIS — E782 Mixed hyperlipidemia: Secondary | ICD-10-CM | POA: Insufficient documentation

## 2016-10-27 DIAGNOSIS — E11621 Type 2 diabetes mellitus with foot ulcer: Secondary | ICD-10-CM | POA: Insufficient documentation

## 2016-10-27 DIAGNOSIS — L97519 Non-pressure chronic ulcer of other part of right foot with unspecified severity: Secondary | ICD-10-CM | POA: Insufficient documentation

## 2016-10-27 DIAGNOSIS — I70299 Other atherosclerosis of native arteries of extremities, unspecified extremity: Secondary | ICD-10-CM | POA: Diagnosis not present

## 2016-10-27 DIAGNOSIS — Z794 Long term (current) use of insulin: Secondary | ICD-10-CM | POA: Insufficient documentation

## 2016-10-27 HISTORY — PX: LOWER EXTREMITY ANGIOGRAPHY: CATH118251

## 2016-10-27 LAB — CBC
HCT: 35.1 % — ABNORMAL LOW (ref 39.0–52.0)
Hemoglobin: 11.2 g/dL — ABNORMAL LOW (ref 13.0–17.0)
MCH: 25.1 pg — ABNORMAL LOW (ref 26.0–34.0)
MCHC: 31.9 g/dL (ref 30.0–36.0)
MCV: 78.5 fL (ref 78.0–100.0)
Platelets: 351 10*3/uL (ref 150–400)
RBC: 4.47 MIL/uL (ref 4.22–5.81)
RDW: 13.6 % (ref 11.5–15.5)
WBC: 8.6 10*3/uL (ref 4.0–10.5)

## 2016-10-27 LAB — BASIC METABOLIC PANEL
Anion gap: 8 (ref 5–15)
BUN: 31 mg/dL — ABNORMAL HIGH (ref 6–20)
CO2: 27 mmol/L (ref 22–32)
Calcium: 9.5 mg/dL (ref 8.9–10.3)
Chloride: 102 mmol/L (ref 101–111)
Creatinine, Ser: 1.64 mg/dL — ABNORMAL HIGH (ref 0.61–1.24)
GFR calc Af Amer: 56 mL/min — ABNORMAL LOW (ref >60)
GFR calc non Af Amer: 49 mL/min — ABNORMAL LOW (ref >60)
Glucose, Bld: 119 mg/dL — ABNORMAL HIGH (ref 65–99)
Potassium: 4.1 mmol/L (ref 3.5–5.1)
Sodium: 137 mmol/L (ref 135–145)

## 2016-10-27 LAB — POCT I-STAT, CHEM 8
BUN: 33 mg/dL — ABNORMAL HIGH (ref 6–20)
Calcium, Ion: 1.16 mmol/L (ref 1.15–1.40)
Chloride: 105 mmol/L (ref 101–111)
Creatinine, Ser: 1.6 mg/dL — ABNORMAL HIGH (ref 0.61–1.24)
Glucose, Bld: 117 mg/dL — ABNORMAL HIGH (ref 65–99)
HCT: 35 % — ABNORMAL LOW (ref 39.0–52.0)
Hemoglobin: 11.9 g/dL — ABNORMAL LOW (ref 13.0–17.0)
Potassium: 4.2 mmol/L (ref 3.5–5.1)
Sodium: 139 mmol/L (ref 135–145)
TCO2: 26 mmol/L (ref 22–32)

## 2016-10-27 LAB — GLUCOSE, CAPILLARY: Glucose-Capillary: 108 mg/dL — ABNORMAL HIGH (ref 65–99)

## 2016-10-27 LAB — PROTIME-INR
INR: 0.99
Prothrombin Time: 13 seconds (ref 11.4–15.2)

## 2016-10-27 LAB — HEMOGLOBIN A1C
Hgb A1c MFr Bld: 5.7 % — ABNORMAL HIGH (ref 4.8–5.6)
Mean Plasma Glucose: 116.89 mg/dL

## 2016-10-27 SURGERY — LOWER EXTREMITY ANGIOGRAPHY
Anesthesia: LOCAL

## 2016-10-27 MED ORDER — LIDOCAINE HCL (PF) 1 % IJ SOLN
INTRAMUSCULAR | Status: DC | PRN
  Administered 2016-10-27: 20 mL

## 2016-10-27 MED ORDER — MIDAZOLAM HCL 2 MG/2ML IJ SOLN
INTRAMUSCULAR | Status: DC | PRN
  Administered 2016-10-27: 1 mg via INTRAVENOUS

## 2016-10-27 MED ORDER — SODIUM CHLORIDE 0.9 % WEIGHT BASED INFUSION: 1.0000 mL/kg/h | INTRAVENOUS | Status: DC

## 2016-10-27 MED ORDER — SODIUM CHLORIDE 0.9 % IV SOLN
INTRAVENOUS | Status: DC
  Administered 2016-10-27: 07:00:00 via INTRAVENOUS

## 2016-10-27 MED ORDER — HEPARIN (PORCINE) IN NACL 2-0.9 UNIT/ML-% IJ SOLN
INTRAMUSCULAR | Status: AC
  Filled 2016-10-27: qty 1000

## 2016-10-27 MED ORDER — FENTANYL CITRATE (PF) 100 MCG/2ML IJ SOLN
INTRAMUSCULAR | Status: AC
  Filled 2016-10-27: qty 2

## 2016-10-27 MED ORDER — LIDOCAINE HCL 2 % IJ SOLN
INTRAMUSCULAR | Status: AC
  Filled 2016-10-27: qty 10

## 2016-10-27 MED ORDER — MIDAZOLAM HCL 2 MG/2ML IJ SOLN
INTRAMUSCULAR | Status: AC
  Filled 2016-10-27: qty 2

## 2016-10-27 MED ORDER — IODIXANOL 320 MG/ML IV SOLN
INTRAVENOUS | Status: DC | PRN
  Administered 2016-10-27: 53 mL via INTRA_ARTERIAL

## 2016-10-27 MED ORDER — HEPARIN (PORCINE) IN NACL 2-0.9 UNIT/ML-% IJ SOLN
INTRAMUSCULAR | Status: AC | PRN
  Administered 2016-10-27: 1000 mL via INTRA_ARTERIAL

## 2016-10-27 MED ORDER — FENTANYL CITRATE (PF) 100 MCG/2ML IJ SOLN
INTRAMUSCULAR | Status: DC | PRN
  Administered 2016-10-27: 50 ug via INTRAVENOUS

## 2016-10-27 SURGICAL SUPPLY — 12 items
CATH ANGIO 5F PIGTAIL 65CM (CATHETERS) ×1 IMPLANT
CATH CROSS OVER TEMPO 5F (CATHETERS) ×1 IMPLANT
CATH STRAIGHT 5FR 65CM (CATHETERS) ×1 IMPLANT
COVER PRB 48X5XTLSCP FOLD TPE (BAG) IMPLANT
COVER PROBE 5X48 (BAG) ×2
KIT MICROINTRODUCER STIFF 5F (SHEATH) ×1 IMPLANT
KIT PV (KITS) ×2 IMPLANT
SHEATH PINNACLE 5F 10CM (SHEATH) ×1 IMPLANT
SYR MEDRAD MARK V 150ML (SYRINGE) ×2 IMPLANT
TRANSDUCER W/STOPCOCK (MISCELLANEOUS) ×2 IMPLANT
TRAY PV CATH (CUSTOM PROCEDURE TRAY) ×2 IMPLANT
WIRE HITORQ VERSACORE ST 145CM (WIRE) ×1 IMPLANT

## 2016-10-27 NOTE — Op Note (Signed)
   PATIENT: [name]      MRN: [id number] DOB: 21-Dec-1970    DATE OF PROCEDURE: 10/27/2016  INDICATIONS:    [name] is a 46 y.o. male who has a slowly healing wound on the lateral aspect of his right foot. 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  PROCEDURE:    1. Ultrasound-guided access to the left common femoral artery 2. Selective catheterization of the right external iliac artery with right lower extremity runoff  SURGEON: Judeth Cornfield. [name], MD, FACS  ANESTHESIA: Local with sedation   EBL: Minimal  TECHNIQUE: The patient was taken to the peripheral vascular lab and was sedated. The period of conscious sedation was 30 minutes.  During that time period, I was present face-to-face 100% of the time.  The patient was administered 1 mg of Versed and 50 g of fentanyl. The patient's heart rate, blood pressure, and oxygen saturation were monitored by the nurse continuously during the procedure.  Both groins were prepped and draped in the usual sterile fashion. Under ultrasound guidance, after the skin was anesthetized, the left common femoral artery was cannulated and a guide were introduced into the infrarenal aorta under fluoroscopic control. A cross of her catheter was used to engage the right common iliac artery. The wire was then advanced into the common femoral artery and the crossover catheter exchanged for a straight catheter. Selective right external iliac arteriograms obtained. Right lower extremity runoff was obtained and then a lateral projection of the foot was obtained. A total of 53 cc of Visipaque contrast was used. The patient was then transferred to the holding area for removal of the sheath after the catheter was removed.   FINDINGS:    1. The common femoral, deep femoral, and superficial femoral artery are widely patent. There is a 20% stenosis of the distal superficial femoral artery  acute Dr. canal but this does not produce significant narrowing. 2. The posterior tibial artery is widely patent down into the foot. The peroneal artery is patent proximally but is occluded distally. The anterior tibial artery is widely patent but the dorsalis pedis is occluded in the foot.  CLINICAL NOTE: The patient does have some mild tibial disease with occlusion of the distal peroneal artery and dorsalis pedis artery but has widely patent posterior tibial and anterior tibial arteries. He should have adequate circulation to heal his wound. There are no options to significantly improve his circulation.  Deitra Mayo, MD, FACS [facility]  DATE OF DICTATION:   10/27/2016

## 2016-10-27 NOTE — Discharge Instructions (Signed)

## 2016-10-27 NOTE — Interval H&P Note (Signed)
History and Physical Interval Note:  10/27/2016 7:26 AM  [name]  has presented today for surgery, with the diagnosis of pvd with ulcer  The various methods of treatment have been discussed with the patient and family. After consideration of risks, benefits and other options for treatment, the patient has consented to  Procedure(s): Lower Extremity Angiography (N/A) as a surgical intervention .  The patient's history has been reviewed, patient examined, no change in status, stable for surgery.  I have reviewed the patient's chart and labs.  Questions were answered to the patient's satisfaction.     Deitra Mayo

## 2016-10-27 NOTE — Progress Notes (Addendum)
Site area: LFA Site Prior to Removal:  Level 0 Pressure Applied For:20 min Manual:   yes Patient Status During Pull:  stable Post Pull Site:  Level 0 Post Pull Instructions Given:  yes Post Pull Pulses Present: palpable Dressing Applied:  tegaderm Bedrest begins @ 0845 till 1245 Comments:

## 2016-10-27 NOTE — Progress Notes (Addendum)
Pt's wife called wanted to know if needed to come in tomorrow for labs, because husband is at [facility] and they drew labs. Explained to [employer] (wife) that I would have Anesthesiologist review labs and let her know if needed any other labs. Dr. Annye Asa reviewed BMET, CBC , PT, INR. She said just check I stat 8 on day of surgery. Notified wifeYork Grice) do not need to bring in for additional labs.

## 2016-10-27 NOTE — H&P (View-Only) (Signed)
Patient name: [name] MRN: [id number] DOB: 1970-04-22 Sex: male   REASON FOR CONSULT:    Diabetic foot ulcer right foot. The consult is requested by Dr.Dillingham.  HPI:   [name] is a pleasant 46 y.o. male,  Referred for evaluation of a diabetic foot ulcer. I have reviewed the records that were sent by Dr. Marla Roe. The patient has a wound on the right lateral foot and was seen in follow up on 10/06/2016 after placement of an Acell graft. He had previously been hospitalized with osteomyelitis of the right foot and underwent amputation of fourth and fifth toes by Dr. Berenice Primas. The patient reportedly had normal ABIs when he was hospitalized at [facility].  The patient does have type 2 diabetes and also stage III chronic kidney disease. The patient has polyneuropathy associated with her diabetes.  Prior to developing this wound on his right foot, he denies any history of claudication, rest pain, or previous nonhealing ulcers. He does not remember any specific injury to his foot.  Currently the wound is being addressed with a VAC.  Past Medical History:  Diagnosis Date  . CKD (chronic kidney disease), stage III [facility])    nephrologist-  dr [name] ([facility] in [place])    . Diabetic foot ulcer with osteomyelitis ([place])    right lateral foot--- s/p  amputation 4th and 5th ray , ACELL , Wound VAC  . Diabetic peripheral neuropathy ([place])   . Hypertension   . Iron deficiency anemia   . Mixed hyperlipidemia   . Type 2 diabetes mellitus treated with insulin ([place])    last A1c 5.3 on 07-29-2016    Family History  Problem Relation Age of Onset  . Diabetes Father     SOCIAL HISTORY: He is not a smoker. Social History   Social History  . Marital status: Married    Spouse name: N/A  . Number of children: N/A  . Years of education: N/A   Occupational History  . Not on file.   Social History Main Topics  . Smoking status: Never Smoker  .  Smokeless tobacco: Never Used  . Alcohol use No  . Drug use: No  . Sexual activity: Not on file   Other Topics Concern  . Not on file   Social History Narrative  . No narrative on file    No Known Allergies  Current Outpatient Prescriptions  Medication Sig Dispense Refill  . aspirin EC 81 MG tablet Take 81 mg by mouth daily.    Marland Kitchen atorvastatin (LIPITOR) 40 MG tablet Take 40 mg by mouth every morning.     . Cyanocobalamin (VITAMIN B-12) 5000 MCG SUBL Place 5,000 mcg under the tongue daily.    . feeding supplement, GLUCERNA SHAKE, (GLUCERNA SHAKE) LIQD Take 237 mLs by mouth 2 (two) times daily between meals. 60 Can 0  . fenofibrate (TRICOR) 48 MG tablet Take 1 tablet (48 mg total) by mouth daily. (Patient taking differently: Take 48 mg by mouth every morning. ) 30 tablet 0  . ferrous sulfate 325 (65 FE) MG tablet Take 1 tablet (325 mg total) by mouth 2 (two) times daily with a meal. 60 tablet 3  . hydrochlorothiazide (MICROZIDE) 12.5 MG capsule Take 12.5 mg by mouth every morning.   3  . insulin glargine (LANTUS) 100 UNIT/ML injection Inject 0.2 mLs (20 Units total) into the skin at bedtime. 10 mL 11  . insulin lispro (HUMALOG) 100 UNIT/ML injection Inject  0.04 mLs (4 Units total) into the skin 2 (two) times daily. 10 mL 11  . Multiple Vitamin (MULTIVITAMIN WITH MINERALS) TABS tablet Take 1 tablet by mouth 2 (two) times daily.    . Omega-3 Fatty Acids (FISH OIL) 1360 MG CAPS Take 1 capsule by mouth every morning.    . ramipril (ALTACE) 5 MG capsule Take 5 mg by mouth every morning.     . sitaGLIPtin (JANUVIA) 50 MG tablet Take 50 mg by mouth every morning.     . vitamin C (VITAMIN C) 500 MG tablet Take 1 tablet (500 mg total) by mouth daily. 30 tablet 0   No current facility-administered medications for this visit.     REVIEW OF SYSTEMS:  [X]  denotes positive finding, [ ]  denotes negative finding Cardiac  Comments:  Chest pain or chest pressure:    Shortness of breath upon  exertion:    Short of breath when lying flat:    Irregular heart rhythm:        Vascular    Pain in calf, thigh, or hip brought on by ambulation:    Pain in feet at night that wakes you up from your sleep:     Blood clot in your veins:    Leg swelling:         Pulmonary    Oxygen at home:    Productive cough:     Wheezing:         Neurologic    Sudden weakness in arms or legs:     Sudden numbness in arms or legs:     Sudden onset of difficulty speaking or slurred speech:    Temporary loss of vision in one eye:     Problems with dizziness:         Gastrointestinal    Blood in stool:     Vomited blood:         Genitourinary    Burning when urinating:     Blood in urine:        Psychiatric    Major depression:         Hematologic    Bleeding problems:    Problems with blood clotting too easily:        Skin    Rashes or ulcers:        Constitutional    Fever or chills:     PHYSICAL EXAM:   Vitals:   10/22/16 1012  BP: 96/73  Pulse: 76  Resp: 16  Temp: 97.7 F (36.5 C)  TempSrc: Oral  SpO2: 99%  Weight: 257 lb 12.8 oz (116.9 kg)  Height: 6' (1.829 m)    GENERAL: The patient is a well-nourished male, in no acute distress. The vital signs are documented above. CARDIAC: There is a regular rate and rhythm.  VASCULAR: I do not detect carotid bruits. On the right side, which is the site of concern, he has a palpable femoral and popliteal pulse. I cannot palpate pedal pulses. He has a biphasic posterior tibial signal with the Doppler and a monophasic dorsalis pedis and anterior tibial signal with the Doppler. On the left side he has a palpable femoral, popliteal, dorsalis pedis, and posterior tibial pulse. He has mild right lower extremity swelling. PULMONARY: There is good air exchange bilaterally without wheezing or rales. ABDOMEN: Soft and non-tender with normal pitched bowel sounds.  MUSCULOSKELETAL: There are no major deformities or cyanosis. NEUROLOGIC: No  focal weakness or paresthesias are detected. SKIN: He has an  extensive wound along the lateral aspect of his right foot with reasonably healthy-appearing granulation tissue although he still has some fibrous tissue. PSYCHIATRIC: The patient has a normal affect.  DATA:    LABS: Creatinine was 1.4 on 09/18/2016. On 08/13/2016, GFR was greater than 60.  ARTERIAL DOPPLER STUDY: The patient did have an arterial Doppler study when he was hospitalized at [facility]. On 07/29/2016 were triphasic Doppler signals in the dorsalis pedis and posterior tibial positions bilaterally. ABI on the right was 100%. ABI on the left was 100%.  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 Given the extent of the wound and his history of diabetes I think arteriography is indicated.   I have reviewed with the patient the indications for arteriography. In addition, I have reviewed the potential complications of arteriography including but not limited to: Bleeding, arterial injury, arterial thrombosis, dye action, renal insufficiency, or other unpredictable medical problems. I have explained to the patient that if we find disease amenable to angioplasty we could potentially address this at the same time. I have discussed the potential complications of angioplasty and stenting, including but not limited to: Bleeding, arterial thrombosis, arterial injury, dissection, or the need for surgical intervention.  According to the records he does have stage III chronic kidney disease however his most recent labs looked good. Regardless, I will use limited contrast and cannulate the left side and study the right leg only. He doesn't have any evidence of inflow disease. Certainly if he has disease amenable to angioplasty this could potentially be addressed at the same time. Fortunately he is not a  smoker. I would agree with continuing the VAC. I'll make further recommendations from a vascular standpoint pending the results of his arteriogram.  His arteriogram is scheduled for 10/27/2016.   Deitra [name] [facility] [phone]

## 2016-10-28 MED FILL — Lidocaine HCl Local Inj 2%: INTRAMUSCULAR | Qty: 20 | Status: AC

## 2016-10-30 ENCOUNTER — Ambulatory Visit (HOSPITAL_BASED_OUTPATIENT_CLINIC_OR_DEPARTMENT_OTHER)
Admission: RE | Admit: 2016-10-30 | Discharge: 2016-10-30 | Disposition: A | Discharge status: Home / Self Care | Payer: Managed Care, Other (non HMO) | Source: Ambulatory Visit | Attending: Plastic Surgery | Admitting: Plastic Surgery

## 2016-10-30 ENCOUNTER — Ambulatory Visit (HOSPITAL_BASED_OUTPATIENT_CLINIC_OR_DEPARTMENT_OTHER): Payer: Managed Care, Other (non HMO) | Admitting: Anesthesiology

## 2016-10-30 ENCOUNTER — Encounter (HOSPITAL_BASED_OUTPATIENT_CLINIC_OR_DEPARTMENT_OTHER)
Admission: RE | Disposition: A | Discharge status: Home / Self Care | Payer: Self-pay | Source: Ambulatory Visit | Attending: Plastic Surgery

## 2016-10-30 ENCOUNTER — Encounter (HOSPITAL_BASED_OUTPATIENT_CLINIC_OR_DEPARTMENT_OTHER): Payer: Self-pay | Admitting: Anesthesiology

## 2016-10-30 DIAGNOSIS — E782 Mixed hyperlipidemia: Secondary | ICD-10-CM | POA: Diagnosis not present

## 2016-10-30 DIAGNOSIS — Z794 Long term (current) use of insulin: Secondary | ICD-10-CM | POA: Diagnosis not present

## 2016-10-30 DIAGNOSIS — E11621 Type 2 diabetes mellitus with foot ulcer: Secondary | ICD-10-CM | POA: Insufficient documentation

## 2016-10-30 DIAGNOSIS — E1165 Type 2 diabetes mellitus with hyperglycemia: Secondary | ICD-10-CM | POA: Insufficient documentation

## 2016-10-30 DIAGNOSIS — L97519 Non-pressure chronic ulcer of other part of right foot with unspecified severity: Secondary | ICD-10-CM | POA: Insufficient documentation

## 2016-10-30 DIAGNOSIS — Z89421 Acquired absence of other right toe(s): Secondary | ICD-10-CM | POA: Diagnosis not present

## 2016-10-30 DIAGNOSIS — N183 Chronic kidney disease, stage 3 (moderate): Secondary | ICD-10-CM | POA: Insufficient documentation

## 2016-10-30 DIAGNOSIS — S91301D Unspecified open wound, right foot, subsequent encounter: Secondary | ICD-10-CM

## 2016-10-30 DIAGNOSIS — I129 Hypertensive chronic kidney disease with stage 1 through stage 4 chronic kidney disease, or unspecified chronic kidney disease: Secondary | ICD-10-CM | POA: Insufficient documentation

## 2016-10-30 DIAGNOSIS — Z7982 Long term (current) use of aspirin: Secondary | ICD-10-CM | POA: Diagnosis not present

## 2016-10-30 DIAGNOSIS — Z79899 Other long term (current) drug therapy: Secondary | ICD-10-CM | POA: Insufficient documentation

## 2016-10-30 DIAGNOSIS — E1122 Type 2 diabetes mellitus with diabetic chronic kidney disease: Secondary | ICD-10-CM | POA: Insufficient documentation

## 2016-10-30 DIAGNOSIS — D509 Iron deficiency anemia, unspecified: Secondary | ICD-10-CM | POA: Insufficient documentation

## 2016-10-30 DIAGNOSIS — Z6835 Body mass index (BMI) 35.0-35.9, adult: Secondary | ICD-10-CM | POA: Diagnosis not present

## 2016-10-30 HISTORY — PX: MASS EXCISION: SHX2000

## 2016-10-30 HISTORY — PX: APPLICATION OF WOUND VAC: SHX5189

## 2016-10-30 LAB — POCT I-STAT, CHEM 8
BUN: 29 mg/dL — ABNORMAL HIGH (ref 6–20)
Calcium, Ion: 1.19 mmol/L (ref 1.15–1.40)
Chloride: 107 mmol/L (ref 101–111)
Creatinine, Ser: 1.4 mg/dL — ABNORMAL HIGH (ref 0.61–1.24)
Glucose, Bld: 116 mg/dL — ABNORMAL HIGH (ref 65–99)
HCT: 37 % — ABNORMAL LOW (ref 39.0–52.0)
Hemoglobin: 12.6 g/dL — ABNORMAL LOW (ref 13.0–17.0)
Potassium: 4.1 mmol/L (ref 3.5–5.1)
Sodium: 142 mmol/L (ref 135–145)
TCO2: 26 mmol/L (ref 22–32)

## 2016-10-30 LAB — GLUCOSE, CAPILLARY: Glucose-Capillary: 104 mg/dL — ABNORMAL HIGH (ref 65–99)

## 2016-10-30 SURGERY — EXCISION MASS
Anesthesia: General | Site: Foot | Laterality: Right

## 2016-10-30 MED ORDER — BUPIVACAINE HCL (PF) 0.25 % IJ SOLN
INTRAMUSCULAR | Status: AC
  Filled 2016-10-30: qty 30

## 2016-10-30 MED ORDER — BACITRACIN ZINC 500 UNIT/GM EX OINT
TOPICAL_OINTMENT | CUTANEOUS | Status: AC
  Filled 2016-10-30: qty 28.35

## 2016-10-30 MED ORDER — ONDANSETRON HCL 4 MG/2ML IJ SOLN
INTRAMUSCULAR | Status: AC
  Filled 2016-10-30: qty 2

## 2016-10-30 MED ORDER — OXYCODONE HCL 5 MG PO TABS: 5.0000 mg | ORAL_TABLET | Freq: Once | ORAL | Status: DC | PRN

## 2016-10-30 MED ORDER — PROPOFOL 10 MG/ML IV BOLUS
INTRAVENOUS | Status: AC
  Filled 2016-10-30: qty 20

## 2016-10-30 MED ORDER — LIDOCAINE-EPINEPHRINE 1 %-1:100000 IJ SOLN
INTRAMUSCULAR | Status: AC
  Filled 2016-10-30: qty 1

## 2016-10-30 MED ORDER — SODIUM CHLORIDE 0.9 % IV SOLN: 250.0000 mL | INTRAVENOUS | Status: DC | PRN

## 2016-10-30 MED ORDER — OXYCODONE HCL 5 MG PO TABS: 5.0000 mg | ORAL_TABLET | ORAL | Status: DC | PRN

## 2016-10-30 MED ORDER — MIDAZOLAM HCL 2 MG/2ML IJ SOLN
INTRAMUSCULAR | Status: AC
  Filled 2016-10-30: qty 2

## 2016-10-30 MED ORDER — CEFAZOLIN SODIUM-DEXTROSE 2-4 GM/100ML-% IV SOLN
INTRAVENOUS | Status: AC
  Filled 2016-10-30: qty 100

## 2016-10-30 MED ORDER — OXYCODONE HCL 5 MG/5ML PO SOLN: 5.0000 mg | Freq: Once | ORAL | Status: DC | PRN

## 2016-10-30 MED ORDER — HYDROCODONE-ACETAMINOPHEN 5-325 MG PO TABS: 1.0000 | ORAL_TABLET | ORAL | 0 refills | Status: DC | PRN

## 2016-10-30 MED ORDER — SCOPOLAMINE 1 MG/3DAYS TD PT72: 1.0000 | MEDICATED_PATCH | Freq: Once | TRANSDERMAL | Status: DC | PRN

## 2016-10-30 MED ORDER — BUPIVACAINE-EPINEPHRINE (PF) 0.25% -1:200000 IJ SOLN
INTRAMUSCULAR | Status: AC
  Filled 2016-10-30: qty 30

## 2016-10-30 MED ORDER — LIDOCAINE HCL (PF) 1 % IJ SOLN
INTRAMUSCULAR | Status: AC
  Filled 2016-10-30: qty 30

## 2016-10-30 MED ORDER — PROPOFOL 10 MG/ML IV BOLUS
INTRAVENOUS | Status: DC | PRN
  Administered 2016-10-30: 200 mg via INTRAVENOUS

## 2016-10-30 MED ORDER — LIDOCAINE 2% (20 MG/ML) 5 ML SYRINGE
INTRAMUSCULAR | Status: AC
  Filled 2016-10-30: qty 5

## 2016-10-30 MED ORDER — LACTATED RINGERS IV SOLN
INTRAVENOUS | Status: DC
  Administered 2016-10-30: 10:00:00 via INTRAVENOUS

## 2016-10-30 MED ORDER — MEPERIDINE HCL 25 MG/ML IJ SOLN: 6.2500 mg | INTRAMUSCULAR | Status: DC | PRN

## 2016-10-30 MED ORDER — LIDOCAINE HCL (CARDIAC) 20 MG/ML IV SOLN
INTRAVENOUS | Status: DC | PRN
  Administered 2016-10-30: 100 mg via INTRAVENOUS

## 2016-10-30 MED ORDER — DEXAMETHASONE SODIUM PHOSPHATE 10 MG/ML IJ SOLN
INTRAMUSCULAR | Status: AC
  Filled 2016-10-30: qty 1

## 2016-10-30 MED ORDER — FENTANYL CITRATE (PF) 100 MCG/2ML IJ SOLN
INTRAMUSCULAR | Status: AC
  Filled 2016-10-30: qty 2

## 2016-10-30 MED ORDER — DEXAMETHASONE SODIUM PHOSPHATE 4 MG/ML IJ SOLN
INTRAMUSCULAR | Status: DC | PRN
  Administered 2016-10-30: 8 mg via INTRAVENOUS

## 2016-10-30 MED ORDER — ONDANSETRON HCL 4 MG/2ML IJ SOLN
INTRAMUSCULAR | Status: DC | PRN
  Administered 2016-10-30: 4 mg via INTRAVENOUS

## 2016-10-30 MED ORDER — CEFAZOLIN SODIUM-DEXTROSE 2-4 GM/100ML-% IV SOLN
2.0000 g | INTRAVENOUS | Status: AC
  Administered 2016-10-30: 2 g via INTRAVENOUS

## 2016-10-30 MED ORDER — SODIUM CHLORIDE 0.9% FLUSH: 3.0000 mL | INTRAVENOUS | Status: DC | PRN

## 2016-10-30 MED ORDER — ACETAMINOPHEN 325 MG PO TABS: 650.0000 mg | ORAL_TABLET | ORAL | Status: DC | PRN

## 2016-10-30 MED ORDER — MIDAZOLAM HCL 2 MG/2ML IJ SOLN
1.0000 mg | INTRAMUSCULAR | Status: DC | PRN
  Administered 2016-10-30: 2 mg via INTRAVENOUS

## 2016-10-30 MED ORDER — ACETAMINOPHEN 650 MG RE SUPP: 650.0000 mg | RECTAL | Status: DC | PRN

## 2016-10-30 MED ORDER — POLYMYXIN B SULFATE 500000 UNITS IJ SOLR
INTRAMUSCULAR | Status: DC | PRN
  Administered 2016-10-30: 500 mL

## 2016-10-30 MED ORDER — HYDROMORPHONE HCL 1 MG/ML IJ SOLN: 0.2500 mg | INTRAMUSCULAR | Status: DC | PRN

## 2016-10-30 MED ORDER — FENTANYL CITRATE (PF) 100 MCG/2ML IJ SOLN
50.0000 ug | INTRAMUSCULAR | Status: DC | PRN
  Administered 2016-10-30: 100 ug via INTRAVENOUS

## 2016-10-30 MED ORDER — SODIUM CHLORIDE 0.9% FLUSH: 3.0000 mL | Freq: Two times a day (BID) | INTRAVENOUS | Status: DC

## 2016-10-30 MED ORDER — PROMETHAZINE HCL 25 MG/ML IJ SOLN: 6.2500 mg | INTRAMUSCULAR | Status: DC | PRN

## 2016-10-30 SURGICAL SUPPLY — 104 items
ADH SKN CLS APL DERMABOND .7 (GAUZE/BANDAGES/DRESSINGS)
APL SKNCLS STERI-STRIP NONHPOA (GAUZE/BANDAGES/DRESSINGS)
BAG DECANTER FOR FLEXI CONT (MISCELLANEOUS) IMPLANT
BANDAGE ACE 3X5.8 VEL STRL LF (GAUZE/BANDAGES/DRESSINGS) IMPLANT
BANDAGE ACE 4X5 VEL STRL LF (GAUZE/BANDAGES/DRESSINGS) IMPLANT
BANDAGE ACE 6X5 VEL STRL LF (GAUZE/BANDAGES/DRESSINGS) IMPLANT
BENZOIN TINCTURE PRP APPL 2/3 (GAUZE/BANDAGES/DRESSINGS) IMPLANT
BLADE CLIPPER SURG (BLADE) IMPLANT
BLADE HEX COATED 2.75 (ELECTRODE) IMPLANT
BLADE MINI RND TIP GREEN BEAV (BLADE) IMPLANT
BLADE SURG 10 STRL SS (BLADE) IMPLANT
BLADE SURG 15 STRL LF DISP TIS (BLADE) ×1 IMPLANT
BLADE SURG 15 STRL SS (BLADE) ×3
BNDG COHESIVE 4X5 TAN STRL (GAUZE/BANDAGES/DRESSINGS) IMPLANT
BNDG CONFORM 2 STRL LF (GAUZE/BANDAGES/DRESSINGS) IMPLANT
BNDG ELASTIC 2X5.8 VLCR STR LF (GAUZE/BANDAGES/DRESSINGS) IMPLANT
BNDG GAUZE ELAST 4 BULKY (GAUZE/BANDAGES/DRESSINGS) IMPLANT
CANISTER SUCT 1200ML W/VALVE (MISCELLANEOUS) IMPLANT
CHLORAPREP W/TINT 26ML (MISCELLANEOUS) IMPLANT
CLEANER CAUTERY TIP 5X5 PAD (MISCELLANEOUS) IMPLANT
CLOSURE WOUND 1/2 X4 (GAUZE/BANDAGES/DRESSINGS)
CORD BIPOLAR FORCEPS 12FT (ELECTRODE) IMPLANT
COVER BACK TABLE 60X90IN (DRAPES) ×3 IMPLANT
COVER MAYO STAND STRL (DRAPES) ×3 IMPLANT
DECANTER SPIKE VIAL GLASS SM (MISCELLANEOUS) IMPLANT
DERMABOND ADVANCED (GAUZE/BANDAGES/DRESSINGS)
DERMABOND ADVANCED .7 DNX12 (GAUZE/BANDAGES/DRESSINGS) IMPLANT
DRAIN PENROSE 1/2X12 LTX STRL (WOUND CARE) IMPLANT
DRAPE INCISE IOBAN 66X45 STRL (DRAPES) IMPLANT
DRAPE LAPAROTOMY 100X72 PEDS (DRAPES) IMPLANT
DRAPE U-SHAPE 76X120 STRL (DRAPES) ×3 IMPLANT
DRSG ADAPTIC 3X8 NADH LF (GAUZE/BANDAGES/DRESSINGS) IMPLANT
DRSG EMULSION OIL 3X3 NADH (GAUZE/BANDAGES/DRESSINGS) IMPLANT
DRSG PAD ABDOMINAL 8X10 ST (GAUZE/BANDAGES/DRESSINGS) IMPLANT
DRSG TEGADERM 2-3/8X2-3/4 SM (GAUZE/BANDAGES/DRESSINGS) IMPLANT
DRSG TEGADERM 4X4.75 (GAUZE/BANDAGES/DRESSINGS) IMPLANT
ELECT COATED BLADE 2.86 ST (ELECTRODE) IMPLANT
ELECT NDL BLADE 2-5/6 (NEEDLE) IMPLANT
ELECT NDL TIP 2.8 STRL (NEEDLE) IMPLANT
ELECT NEEDLE BLADE 2-5/6 (NEEDLE) IMPLANT
ELECT NEEDLE TIP 2.8 STRL (NEEDLE) IMPLANT
ELECT REM PT RETURN 9FT ADLT (ELECTROSURGICAL)
ELECT REM PT RETURN 9FT PED (ELECTROSURGICAL)
ELECTRODE REM PT RETRN 9FT PED (ELECTROSURGICAL) IMPLANT
ELECTRODE REM PT RTRN 9FT ADLT (ELECTROSURGICAL) IMPLANT
GAUZE SPONGE 4X4 12PLY STRL (GAUZE/BANDAGES/DRESSINGS) ×3 IMPLANT
GAUZE SPONGE 4X4 12PLY STRL LF (GAUZE/BANDAGES/DRESSINGS) IMPLANT
GLOVE BIO SURGEON STRL SZ 6.5 (GLOVE) ×4 IMPLANT
GLOVE BIO SURGEONS STRL SZ 6.5 (GLOVE) ×2
GOWN STRL REUS W/ TWL LRG LVL3 (GOWN DISPOSABLE) ×2 IMPLANT
GOWN STRL REUS W/TWL LRG LVL3 (GOWN DISPOSABLE) ×6
NDL HYPO 30GX1 BEV (NEEDLE) IMPLANT
NDL PRECISIONGLIDE 27X1.5 (NEEDLE) ×1 IMPLANT
NEEDLE HYPO 30GX1 BEV (NEEDLE) IMPLANT
NEEDLE PRECISIONGLIDE 27X1.5 (NEEDLE) ×3 IMPLANT
NS IRRIG 1000ML POUR BTL (IV SOLUTION) ×3 IMPLANT
PACK BASIN DAY SURGERY FS (CUSTOM PROCEDURE TRAY) ×3 IMPLANT
PAD CLEANER CAUTERY TIP 5X5 (MISCELLANEOUS)
PADDING CAST ABS 3INX4YD NS (CAST SUPPLIES)
PADDING CAST ABS 4INX4YD NS (CAST SUPPLIES)
PADDING CAST ABS COTTON 3X4 (CAST SUPPLIES) IMPLANT
PADDING CAST ABS COTTON 4X4 ST (CAST SUPPLIES) IMPLANT
PENCIL BUTTON HOLSTER BLD 10FT (ELECTRODE) IMPLANT
RUBBERBAND STERILE (MISCELLANEOUS) IMPLANT
SHEET MEDIUM DRAPE 40X70 STRL (DRAPES) IMPLANT
SLEEVE SCD COMPRESS KNEE MED (MISCELLANEOUS) IMPLANT
SPLINT PLASTER CAST XFAST 3X15 (CAST SUPPLIES) IMPLANT
SPLINT PLASTER XTRA FASTSET 3X (CAST SUPPLIES)
SPONGE GAUZE 2X2 8PLY STER LF (GAUZE/BANDAGES/DRESSINGS)
SPONGE GAUZE 2X2 8PLY STRL LF (GAUZE/BANDAGES/DRESSINGS) IMPLANT
SPONGE LAP 18X18 X RAY DECT (DISPOSABLE) ×3 IMPLANT
STAPLER VISISTAT 35W (STAPLE) IMPLANT
STOCKINETTE 4X48 STRL (DRAPES) IMPLANT
STOCKINETTE 6  STRL (DRAPES) ×2
STOCKINETTE 6 STRL (DRAPES) ×1 IMPLANT
STOCKINETTE IMPERVIOUS LG (DRAPES) IMPLANT
STRIP CLOSURE SKIN 1/2X4 (GAUZE/BANDAGES/DRESSINGS) IMPLANT
SUCTION FRAZIER HANDLE 10FR (MISCELLANEOUS)
SUCTION TUBE FRAZIER 10FR DISP (MISCELLANEOUS) IMPLANT
SURGILUBE 2OZ TUBE FLIPTOP (MISCELLANEOUS) IMPLANT
SUT MNCRL 6-0 UNDY P1 1X18 (SUTURE) IMPLANT
SUT MNCRL AB 3-0 PS2 18 (SUTURE) IMPLANT
SUT MNCRL AB 4-0 PS2 18 (SUTURE) IMPLANT
SUT MON AB 5-0 P3 18 (SUTURE) IMPLANT
SUT MON AB 5-0 PS2 18 (SUTURE) IMPLANT
SUT MONOCRYL 6-0 P1 1X18 (SUTURE)
SUT PROLENE 5 0 P 3 (SUTURE) IMPLANT
SUT PROLENE 5 0 PS 2 (SUTURE) IMPLANT
SUT PROLENE 6 0 P 1 18 (SUTURE) IMPLANT
SUT SILK 3 0 PS 1 (SUTURE) IMPLANT
SUT VIC AB 3-0 FS2 27 (SUTURE) IMPLANT
SUT VIC AB 5-0 P-3 18X BRD (SUTURE) IMPLANT
SUT VIC AB 5-0 P3 18 (SUTURE)
SUT VIC AB 5-0 PS2 18 (SUTURE) IMPLANT
SUT VICRYL 4-0 PS2 18IN ABS (SUTURE) IMPLANT
SYR BULB 3OZ (MISCELLANEOUS) IMPLANT
SYR BULB IRRIGATION 50ML (SYRINGE) IMPLANT
SYR CONTROL 10ML LL (SYRINGE) ×3 IMPLANT
TOWEL OR 17X24 6PK STRL BLUE (TOWEL DISPOSABLE) ×3 IMPLANT
TRAY DSU PREP LF (CUSTOM PROCEDURE TRAY) ×3 IMPLANT
TUBE CONNECTING 20'X1/4 (TUBING)
TUBE CONNECTING 20X1/4 (TUBING) IMPLANT
UNDERPAD 30X30 (UNDERPADS AND DIAPERS) ×3 IMPLANT
YANKAUER SUCT BULB TIP NO VENT (SUCTIONS) IMPLANT

## 2016-10-30 NOTE — Anesthesia Preprocedure Evaluation (Signed)
Anesthesia Evaluation  Patient identified by MRN, date of birth, ID band Patient awake    Reviewed: Allergy & Precautions, NPO status , Patient's Chart, lab work & pertinent test results  Airway Mallampati: II  TM Distance: >3 FB Neck ROM: Full    Dental no notable dental hx. (+) Teeth Intact, Dental Advisory Given   Pulmonary    breath sounds clear to auscultation       Cardiovascular hypertension,  Rhythm:Regular Rate:Normal     Neuro/Psych  Neuromuscular disease    GI/Hepatic negative GI ROS, Neg liver ROS,   Endo/Other  diabetes, Poorly ControlledMorbid obesity  Renal/GU Renal InsufficiencyRenal disease     Musculoskeletal   Abdominal   Peds  Hematology   Anesthesia Other Findings   Reproductive/Obstetrics                             Anesthesia Physical  Anesthesia Plan  ASA: III  Anesthesia Plan: General   Post-op Pain Management:    Induction: Intravenous  PONV Risk Score and Plan: 3 and Ondansetron, Dexamethasone, Midazolam and Propofol infusion  Airway Management Planned: LMA  Additional Equipment:   Intra-op Plan:   Post-operative Plan: Extubation in OR  Informed Consent: I have reviewed the patients History and Physical, chart, labs and discussed the procedure including the risks, benefits and alternatives for the proposed anesthesia with the patient or authorized representative who has indicated his/her understanding and acceptance.     Plan Discussed with: CRNA  Anesthesia Plan Comments:         Anesthesia Quick Evaluation

## 2016-10-30 NOTE — Discharge Instructions (Signed)
VAC change on Monday. Keep leg elevated  Call your surgeon if you experience:   1.  Fever over 101.0. 2.  Inability to urinate. 3.  Nausea and/or vomiting. 4.  Extreme swelling or bruising at the surgical site. 5.  Continued bleeding from the incision. 6.  Increased pain, redness or drainage from the incision. 7.  Problems related to your pain medication. 8.  Any problems and/or concerns   Post Anesthesia Home Care Instructions  Activity: Get plenty of rest for the remainder of the day. A responsible individual must stay with you for 24 hours following the procedure.  For the next 24 hours, DO NOT: -Drive a car -Paediatric nurse -Drink alcoholic beverages -Take any medication unless instructed by your physician -Make any legal decisions or sign important papers.  Meals: Start with liquid foods such as gelatin or soup. Progress to regular foods as tolerated. Avoid greasy, spicy, heavy foods. If nausea and/or vomiting occur, drink only clear liquids until the nausea and/or vomiting subsides. Call your physician if vomiting continues.  Special Instructions/Symptoms: Your throat may feel dry or sore from the anesthesia or the breathing tube placed in your throat during surgery. If this causes discomfort, gargle with warm salt water. The discomfort should disappear within 24 hours.  If you had a scopolamine patch placed behind your ear for the management of post- operative nausea and/or vomiting:  1. The medication in the patch is effective for 72 hours, after which it should be removed.  Wrap patch in a tissue and discard in the trash. Wash hands thoroughly with soap and water. 2. You may remove the patch earlier than 72 hours if you experience unpleasant side effects which may include dry mouth, dizziness or visual disturbances. 3. Avoid touching the patch. Wash your hands with soap and water after contact with the patch.

## 2016-10-30 NOTE — Transfer of Care (Signed)
Immediate Anesthesia Transfer of Care Note  Patient: [name]  Procedure(s) Performed: EXCISION RIGHT FOOT WOUND WITH VAC PLACEMENT (Right Foot) APPLICATION OF WOUND VAC (Right Foot)  Patient Location: PACU  Anesthesia Type:General  Level of Consciousness: awake, alert  and oriented  Airway & Oxygen Therapy: Patient Spontanous Breathing and Patient connected to face mask oxygen  Post-op Assessment: Report given to RN and Post -op Vital signs reviewed and stable  Post vital signs: Reviewed and stable  Last Vitals:  Vitals:   10/30/16 0923  BP: 104/66  Pulse: 79  Resp: 18  Temp: 36.9 C  SpO2: 100%    Last Pain:  Vitals:   10/30/16 0923  TempSrc: Oral      Patients Stated Pain Goal: 0 (09/32/35 5732)  Complications: No apparent anesthesia complications

## 2016-10-30 NOTE — Anesthesia Procedure Notes (Signed)
Procedure Name: LMA Insertion Date/Time: 10/30/2016 10:17 AM Performed by: Melynda Ripple D Pre-anesthesia Checklist: Patient identified, Emergency Drugs available, Suction available and Patient being monitored Patient Re-evaluated:Patient Re-evaluated prior to induction Oxygen Delivery Method: Circle system utilized Preoxygenation: Pre-oxygenation with 100% oxygen Induction Type: IV induction Ventilation: Mask ventilation without difficulty LMA: LMA inserted LMA Size: 5.0 Number of attempts: 1 Airway Equipment and Method: Bite block Placement Confirmation: positive ETCO2 Tube secured with: Tape Dental Injury: Teeth and Oropharynx as per pre-operative assessment

## 2016-10-30 NOTE — H&P (View-Only) (Signed)
[name] is an 46 y.o. male.   Chief Complaint: right foot wound HPI: The patient is a 46 y.o. yrs old bm here for his right foot wound from 7/25.  He has been getting the VAC changed 2-3 times per week.  The distal aspect is healing well with very good granulation tissue.  The proximal area has nonviable fat.  He had his 4 th and 5 th toe amputated with Dr. Berenice Primas in July. He has a vascular consult next week for further evaluation.  The area does not look infected at this time.  Past Medical History:  Diagnosis Date  . CKD (chronic kidney disease), stage III [facility])    nephrologist-  dr [name] ([facility] in [place])    . Diabetic foot ulcer with osteomyelitis ([place])    right lateral foot--- s/p  amputation 4th and 5th ray , ACELL , Wound VAC  . Diabetic peripheral neuropathy ([place])   . Hypertension   . Iron deficiency anemia   . Mixed hyperlipidemia   . Type 2 diabetes mellitus treated with insulin ([place])    last A1c 5.3 on 07-29-2016    Past Surgical History:  Procedure Laterality Date  . AMPUTATION Right 07/31/2016   Procedure: AMPUTATION RIGHT FIFTH RAY AND APPLICATION OF WOUND VAC;  Surgeon: Dorna Leitz, MD;  Location: [facility];  Service: Orthopedics;  Laterality: Right;  . AMPUTATION Right 08/08/2016   Procedure: RAY AMPUTATION RIGHT FOURTH METATARSAL, DEBRIDEMENT OF SUBQ TISSUE,BONE AND SKIN WITH WOUND CLOSURE;  Surgeon: Dorna Leitz, MD;  Location: [facility];  Service: Orthopedics;  Laterality: Right;  . ANAL FISSURE REPAIR  2000  . APPLICATION OF A-CELL OF EXTREMITY Right 08/13/2016   Procedure: APPLICATION OF A-CELL AND VAC;  Surgeon: Wallace Going, DO;  Location: [facility];  Service: Plastics;  Laterality: Right;  . APPLICATION OF WOUND VAC Right 09/18/2016   Procedure: APPLICATION OF WOUND VAC (PT. WILL BRING HIS OWN);  Surgeon: Wallace Going, DO;  Location: [facility];  Service: Plastics;  Laterality: Right;  . CIRCUMCISION  2006  .  I&D EXTREMITY Right 09/18/2016   Procedure: IRRIGATION AND DEBRIDEMENT OF RIGHT LATERAL DIABLTIC FOOT ULCER;  Surgeon: Wallace Going, DO;  Location: [facility];  Service: Plastics;  Laterality: Right;  . INCISION AND DRAINAGE OF WOUND Right 08/13/2016   Procedure: IRRIGATION AND DEBRIDEMENT WOUND RIGHT FOOT;  Surgeon: Wallace Going, DO;  Location: [facility];  Service: Plastics;  Laterality: Right;  . LAPAROSCOPIC CHOLECYSTECTOMY  09-09-2006   dr Zella Richer  . wisdom teeth extracted  1984    Family History  Problem Relation Age of Onset  . Diabetes Father    Social History:  reports that he has never smoked. He has never used smokeless tobacco. He reports that he does not drink alcohol or use drugs.  Allergies: No Known Allergies   (Not in a hospital admission)  No results found for this or any previous visit (from the past 48 hour(s)). No results found.  Review of Systems  Constitutional: Negative.   HENT: Negative.   Eyes: Negative.   Respiratory: Negative.   Cardiovascular: Negative.   Gastrointestinal: Negative.   Genitourinary: Negative.   Musculoskeletal: Negative.   Skin: Negative.   Neurological: Negative.   Psychiatric/Behavioral: Negative.     There were no vitals taken for this visit. Physical Exam  Constitutional: He is oriented to person, place, and time. He appears well-developed and well-nourished.  HENT:  Head: Normocephalic and atraumatic.  Eyes: Pupils are equal, round, and reactive to light. EOM are normal.  Cardiovascular: Normal rate.   Respiratory: Effort normal.  GI: Soft.  Neurological: He is alert and oriented to person, place, and time.  Skin: Skin is warm.  Psychiatric: He has a normal mood and affect. His behavior is normal. Judgment and thought content normal.     Assessment/Plan Debridement of right foot with Acell and VAC placement.  Wallace Going, DO 10/24/2016, 2:20 PM

## 2016-10-30 NOTE — Op Note (Signed)
DATE OF OPERATION: 10/30/2016  LOCATION: [facility]  PREOPERATIVE DIAGNOSIS: right foot chronic ulcer / wound  POSTOPERATIVE DIAGNOSIS: Same  PROCEDURE: Excision of right foot wound 4 x 8 cm skin, soft tissue and 1 x 3 cm muscle.  Application of the VAC  SURGEON: Claire Sanger Dillingham, DO  EBL: 10 cc  CONDITION: Stable  COMPLICATIONS: None  INDICATION: The patient, [name], is a 46 y.o. male born on 1970-07-09, is here for treatment of a right foot chronic wound.   PROCEDURE DETAILS:  The patient was seen prior to surgery and marked.  The IV antibiotics were given. The patient was taken to the operating room and given a general anesthetic. A standard time out was performed and all information was confirmed by those in the room. SCD was placed on the opposite leg.  The leg was prepped and draped in the usual sterile fashion.  The foot was irrigated with antibiotic solution and saline.  The #15 blade and currette were used to debride the soft tissue, skin edge of the 4 x 8 cm wound.  The 1 x 3 cm area of muscle necrosis at the center portion of the wound was excised with the #15 blade.  Hemostasis was achieved with pressure.  The VAC was applied and there was an excellent seal. The leg and foot was wrapped with ABD, kerlex and an Ace wrap.  The patient was allowed to wake up and taken to recovery room in stable condition at the end of the case. The family was notified at the end of the case.

## 2016-10-30 NOTE — Interval H&P Note (Signed)
History and Physical Interval Note:  10/30/2016 9:52 AM  [name]  has presented today for surgery, with the diagnosis of diabetic foot ulcer  diabetic foot infection  The various methods of treatment have been discussed with the patient and family. After consideration of risks, benefits and other options for treatment, the patient has consented to  Procedure(s): EXCISION RIGHT FOOT WOUND WITH VAC PLACEMENT (Right) APPLICATION OF WOUND VAC (Right) as a surgical intervention .  The patient's history has been reviewed, patient examined, no change in status, stable for surgery.  I have reviewed the patient's chart and labs.  Questions were answered to the patient's satisfaction.     Wallace Going

## 2016-10-30 NOTE — Anesthesia Postprocedure Evaluation (Signed)
Anesthesia Post Note  Patient: Ishmail Mcmanamon [name]  Procedure(s) Performed: EXCISION RIGHT FOOT WOUND WITH VAC PLACEMENT (Right Foot) APPLICATION OF WOUND VAC (Right Foot)     Patient location during evaluation: PACU Anesthesia Type: General Level of consciousness: awake and alert Pain management: pain level controlled Vital Signs Assessment: post-procedure vital signs reviewed and stable Respiratory status: spontaneous breathing, nonlabored ventilation and respiratory function stable Cardiovascular status: blood pressure returned to baseline and stable Postop Assessment: no apparent nausea or vomiting Anesthetic complications: no    Last Vitals:  Vitals:   10/30/16 1135 10/30/16 1153  BP:  138/83  Pulse: 70 70  Resp: 10 18  Temp:  36.8 C  SpO2: 100% 100%    Last Pain:  Vitals:   10/30/16 1153  TempSrc:   PainSc: 0-No pain                 Lynda Rainwater

## 2016-10-31 ENCOUNTER — Encounter (HOSPITAL_BASED_OUTPATIENT_CLINIC_OR_DEPARTMENT_OTHER): Payer: Self-pay | Admitting: Plastic Surgery

## 2016-11-03 ENCOUNTER — Other Ambulatory Visit: Payer: Self-pay | Admitting: *Deleted

## 2016-11-03 NOTE — Patient Outreach (Signed)
Spoke with [name] via phone and verified that no longer has [employer] as his wife now works for [employer]. He is aware he is no longer eligible to participant in the [facility] and the associated program pharmacy benefits. He voiced appreciation for this RNCM's assistance over the years. This RNCM wished him well and a continued uneventful recovery from his partial foot amputation done 07/31/16. Terminated [name] from the [employer] effective today. Notified [employer] support and [facility] case Psychologist, prison and probation services.  Barrington Ellison RN,CCM,CDE [place] Management Coordinator [facility] and [employer] [phone] Office Fax [phone]

## 2016-11-17 ENCOUNTER — Encounter: Payer: Self-pay | Admitting: Family Medicine

## 2016-11-17 ENCOUNTER — Ambulatory Visit (INDEPENDENT_AMBULATORY_CARE_PROVIDER_SITE_OTHER): Payer: Managed Care, Other (non HMO) | Admitting: Family Medicine

## 2016-11-17 VITALS — BP 108/70 | HR 77 | Temp 98.2°F | Ht 72.0 in | Wt 265.4 lb

## 2016-11-17 DIAGNOSIS — E1169 Type 2 diabetes mellitus with other specified complication: Secondary | ICD-10-CM | POA: Diagnosis not present

## 2016-11-17 DIAGNOSIS — N182 Chronic kidney disease, stage 2 (mild): Secondary | ICD-10-CM | POA: Insufficient documentation

## 2016-11-17 DIAGNOSIS — I10 Essential (primary) hypertension: Secondary | ICD-10-CM | POA: Diagnosis not present

## 2016-11-17 DIAGNOSIS — N183 Chronic kidney disease, stage 3 unspecified: Secondary | ICD-10-CM | POA: Insufficient documentation

## 2016-11-17 DIAGNOSIS — E1142 Type 2 diabetes mellitus with diabetic polyneuropathy: Secondary | ICD-10-CM

## 2016-11-17 DIAGNOSIS — E785 Hyperlipidemia, unspecified: Secondary | ICD-10-CM | POA: Diagnosis not present

## 2016-11-17 DIAGNOSIS — D509 Iron deficiency anemia, unspecified: Secondary | ICD-10-CM | POA: Insufficient documentation

## 2016-11-17 DIAGNOSIS — M869 Osteomyelitis, unspecified: Secondary | ICD-10-CM

## 2016-11-17 DIAGNOSIS — E11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ical complication: Secondary | ICD-10-CM

## 2016-11-17 DIAGNOSIS — Z23 Encounter for immunization: Secondary | ICD-10-CM | POA: Diagnosis not present

## 2016-11-17 DIAGNOSIS — R7989 Other specified abnormal findings of blood chemistry: Secondary | ICD-10-CM

## 2016-11-17 DIAGNOSIS — R945 Abnormal results of liver function studies: Secondary | ICD-10-CM | POA: Diagnosis not present

## 2016-11-17 DIAGNOSIS — L97509 Non-pressure chronic ulcer of other part of unspecified foot with unspecified severity: Secondary | ICD-10-CM | POA: Diagnosis not present

## 2016-11-17 DIAGNOSIS — E11621 Type 2 diabetes mellitus with foot ulcer: Secondary | ICD-10-CM | POA: Diagnosis not present

## 2016-11-17 DIAGNOSIS — E538 Deficiency of other specified B group vitamins: Secondary | ICD-10-CM | POA: Insufficient documentation

## 2016-11-17 DIAGNOSIS — Z89421 Acquired absence of other right toe(s): Secondary | ICD-10-CM | POA: Diagnosis not present

## 2016-11-17 DIAGNOSIS — E1140 Type 2 diabetes mellitus with diabetic neuropathy, unspecified: Secondary | ICD-10-CM | POA: Insufficient documentation

## 2016-11-17 NOTE — Patient Instructions (Addendum)
Pleasure to meet you!   Glad things are going better with the foot  Lets follow up 3-4 months- will update a1c then  Please dont hesitate to come see [place] if something comes up between now and then

## 2016-11-17 NOTE — Assessment & Plan Note (Signed)
S: reasonably controlled on Atorvastatin 60mg , tricor 48mg . No myalgias.  Lab Results  Component Value Date   CHOL 153 02/01/2016   HDL 38 02/01/2016   LDLCALC 95 02/01/2016   TRIG 90 02/01/2016   CHOLHDL 2.2 01/27/2007   A/P: Continue current medications though a atorvastatin dose is atypical

## 2016-11-17 NOTE — Assessment & Plan Note (Addendum)
S: Diagnosed September 2001.  Very well controlled. On Lantus 20-30 units at night.  Humgalog 4-10 units each meal.  Januvia 50mg  daily.   Had been on metformin 17 years- was switched for unclear reasons other than being on it a long time.  CBGs- usally 80-140 at home. In last 3 months had one cbg at 75.  Exercise and diet- has been working on healthier lifestyle and believes this is helping Lab Results  Component Value Date   HGBA1C 5.7 (H) 10/27/2016   HGBA1C 5.3 07/29/2016   HGBA1C 6.4 05/10/2015   A/P: Very well controlled.  We discussed risks of hypoglycemia and I would rather have him on lower doses if recurrent hypoglycemia.  He agrees to monitor and let me know if he has recurrent issues.  Also has neuropathy not on treatment.  Likely contributed to amputation

## 2016-11-17 NOTE — Assessment & Plan Note (Signed)
Noted- cared for by plastic surgery

## 2016-11-17 NOTE — Assessment & Plan Note (Signed)
Lab Results  Component Value Date   ALT 22 08/13/2016   AST 17 08/13/2016   ALKPHOS 143 (H) 08/13/2016   BILITOT 0.5 08/13/2016  Last LFTs largely normal except for mild alk phos elevation.  Will monitor with labs at follow-up

## 2016-11-17 NOTE — Assessment & Plan Note (Signed)
S: controlled on Ramipril 5mg , hctz 12.5 mg. On ramipril for kidney protection for patient BP Readings from Last 3 Encounters:  11/17/16 108/70  10/30/16 138/83  10/27/16 117/84  A/P: We discussed blood pressure goal of <140/90. Continue current meds

## 2016-11-17 NOTE — Assessment & Plan Note (Signed)
Not clear if due to diabetes or illness from hospitalized.  Appears GFR was above 60 before hospitalization.  Has nephrology follow-up planned apparently.  We will also plan to repeat a bmet at follow-up

## 2016-11-17 NOTE — Assessment & Plan Note (Signed)
Started as blister and seen at prior PCPs office- antibiotics then changed to another with culture. 2 weeks later emergency room [facility]- IV antibiotic4th to 9th- spread to bone and in hospital 16 days. He has had a wound VAC since that time.  He is hoping to get a skin graft and get off of the wound VAC on November 12th.

## 2016-11-17 NOTE — Assessment & Plan Note (Signed)
Neuropathy- numbness/tingling in both feet.  Not on treatment.  Denies significant pain.  Likely contributed to 4th and 5th ray removal

## 2016-11-17 NOTE — Progress Notes (Signed)
Phone: [phone]  Subjective:  Patient presents today to establish care.  Prior patient of [name] - [facility]. Chief complaint-noted.   See problem oriented charting  The following were reviewed and entered/updated in epic: Past Medical History:  Diagnosis Date  . CKD (chronic kidney disease), stage III [facility])    nephrologist-  dr [name] ([facility] in [place])    . Diabetic foot ulcer with osteomyelitis ([place])    right lateral foot--- s/p  amputation 4th and 5th ray , ACELL , Wound VAC  . Diabetic peripheral neuropathy ([place])   . Hypertension   . Iron deficiency anemia   . Mixed hyperlipidemia   . Type 2 diabetes mellitus treated with insulin ([place])    last A1c 5.3 on 07-29-2016   Patient Active Problem List   Diagnosis Date Noted  . Diabetic foot ulcer with osteomyelitis ([place]) 07/29/2016    Priority: High  . Morbid obesity ([place]) 03/08/2015    Priority: High  . Type II diabetes mellitus with neurological manifestations ([place]) 03/07/2015    Priority: High  . Chronic kidney disease (CKD), stage III (moderate) ([place]) 11/17/2016    Priority: Medium  . Diabetic neuropathy ([place]) 11/17/2016    Priority: Medium  . Elevated LFTs     Priority: Medium  . Dyslipidemia 03/08/2015    Priority: Medium  . HTN (hypertension) 03/08/2015    Priority: Medium  . B12 deficiency 11/17/2016    Priority: Low  . Iron deficiency anemia 11/17/2016    Priority: Low  . 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 AMPUTATION Right 07/31/2016   Procedure: AMPUTATION RIGHT FIFTH RAY AND APPLICATION OF WOUND VAC;  Surgeon: Dorna Leitz, MD;  Location: [facility];  Service: Orthopedics;  Laterality: Right;  . AMPUTATION Right 08/08/2016   Procedure: RAY  AMPUTATION RIGHT FOURTH METATARSAL, DEBRIDEMENT OF SUBQ TISSUE,BONE AND SKIN WITH WOUND CLOSURE;  Surgeon: Dorna Leitz, MD;  Location: [facility];  Service: Orthopedics;  Laterality: Right;  . ANAL FISSURE REPAIR  2000  . APPLICATION OF A-CELL OF EXTREMITY Right 08/13/2016   Procedure: APPLICATION OF A-CELL AND VAC;  Surgeon: Wallace Going, DO;  [facility];  Service: Plastics;  Laterality: Right;  . APPLICATION OF WOUND VAC Right 09/18/2016   Procedure: APPLICATION OF WOUND VAC (PT. WILL BRING HIS OWN);  Surgeon: Wallace Going, DO;  Location: [facility];  Service: Plastics;  Laterality: Right;  . APPLICATION OF WOUND VAC Right 10/30/2016   Procedure: APPLICATION OF WOUND VAC;  Surgeon: Wallace Going, DO;  Location: [place];  Service: Plastics;  Laterality: Right;  . CIRCUMCISION  2006  . I&D EXTREMITY Right 09/18/2016   Procedure: IRRIGATION AND DEBRIDEMENT OF RIGHT LATERAL DIABLTIC FOOT ULCER;  Surgeon: Wallace Going, DO;  Location: [place];  Service: Plastics;  Laterality: Right;  . INCISION AND DRAINAGE OF WOUND Right 08/13/2016   Procedure: IRRIGATION AND DEBRIDEMENT WOUND RIGHT FOOT;  Surgeon: Wallace Going, DO;  [facility];  Service: Plastics;  Laterality: Right;  . LAPAROSCOPIC CHOLECYSTECTOMY  09-09-2006   dr Zella Richer  . LOWER EXTREMITY ANGIOGRAPHY N/A 10/27/2016   Procedure: Lower Extremity Angiography;  Surgeon: Angelia Mould, MD;  Location: [facility]  INVASIVE CV LAB;  Service: Cardiovascular;  Laterality: N/A;  . MASS EXCISION Right 10/30/2016   Procedure: EXCISION RIGHT FOOT WOUND WITH VAC PLACEMENT;  Surgeon: Wallace Going, DO;  Location: [place];  Service: Plastics;  Laterality: Right;  . wisdom teeth extracted  1984    Family History  Problem Relation Age of Onset  . Stroke Father        early 107s. former smoker  . Diabetes Father   . Hypertension Father     . Sarcoidosis Mother   . Hypertension Sister   . Healthy Daughter   . Diabetes Paternal Grandmother   . Congenital heart disease Paternal Grandmother        died of complications    Medications- reviewed and updated Current Outpatient Prescriptions  Medication Sig Dispense Refill  . aspirin EC 81 MG tablet Take 81 mg by mouth daily.    Marland Kitchen atorvastatin (LIPITOR) 40 MG tablet Take 60 mg by mouth every morning.     . Cyanocobalamin (VITAMIN B-12) 5000 MCG SUBL Place 5,000 mcg under the tongue daily.    . feeding supplement, GLUCERNA SHAKE, (GLUCERNA SHAKE) LIQD Take 237 mLs by mouth 2 (two) times daily between meals. (Patient taking differently: Take 237 mLs by mouth 2 (two) times daily as needed. ) 60 Can 0  . fenofibrate (TRICOR) 48 MG tablet Take 1 tablet (48 mg total) by mouth daily. (Patient taking differently: Take 48 mg by mouth every morning. ) 30 tablet 0  . ferrous sulfate 325 (65 FE) MG tablet Take 1 tablet (325 mg total) by mouth 2 (two) times daily with a meal. 60 tablet 3  . hydrochlorothiazide (MICROZIDE) 12.5 MG capsule Take 12.5 mg by mouth every morning.   3  . insulin glargine (LANTUS) 100 UNIT/ML injection Inject 0.2 mLs (20 Units total) into the skin at bedtime. (Patient taking differently: Inject 20-26 Units into the skin at bedtime. ) 10 mL 11  . insulin lispro (HUMALOG) 100 UNIT/ML injection Inject 0.04 mLs (4 Units total) into the skin 2 (two) times daily. (Patient taking differently: Inject 4-8 Units into the skin 2 (two) times daily. ) 10 mL 11  . Multiple Vitamin (MULTIVITAMIN WITH MINERALS) TABS tablet Take 1 tablet by mouth 2 (two) times daily.    . Omega-3 Fatty Acids (FISH OIL) 1360 MG CAPS Take 1 capsule by mouth every morning.    . pyridoxine (B-6) 100 MG tablet Take 100 mg by mouth 2 (two) times daily.    . ramipril (ALTACE) 5 MG capsule Take 5 mg by mouth every morning.     . sitaGLIPtin (JANUVIA) 50 MG tablet Take 50 mg by mouth every morning.     . vitamin  C (VITAMIN C) 500 MG tablet Take 1 tablet (500 mg total) by mouth daily. (Patient taking differently: Take 500 mg by mouth 2 (two) times daily. ) 30 tablet 0   No current facility-administered medications for this visit.     Allergies-reviewed and updated No Known Allergies  Social History   Social History  . Marital status: Married    Spouse name: N/A  . Number of children: N/A  . Years of education: N/A   Social History Main Topics  . Smoking status: Never Smoker  . Smokeless tobacco: Never Used  . Alcohol use No  . Drug use: No  . Sexual activity: Yes   Other Topics Concern  . None   Social History Narrative   Married. 5 year  old daughter 10/2016.       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       Hobbies: drawing, archery, tinkering    ROS--Full ROS was completed Review of Systems  Constitutional: Negative for chills and fever.  HENT: Negative for hearing loss and tinnitus.   Eyes: Negative for blurred vision and double vision.  Respiratory: Negative for cough, shortness of breath and wheezing.   Cardiovascular: Negative for chest pain and palpitations.  Gastrointestinal: Negative for heartburn and nausea.  Genitourinary: Negative for dysuria and urgency.  Musculoskeletal: Negative for myalgias and neck pain.       Some pain at amputation site- minimal  Skin: Negative for itching and rash.  Neurological: Positive for tingling (in bottom of feet). Negative for dizziness.  Endo/Heme/Allergies: Negative for polydipsia. Does not bruise/bleed easily.  Psychiatric/Behavioral: Negative for hallucinations and substance abuse.   Objective: BP 108/70 (BP Location: Left Arm, Patient Position: Sitting, Cuff Size: Large)   Pulse 77   Temp 98.2 F (36.8 C) (Oral)   Ht 6' (1.829 m)   Wt 265 lb 6.4 oz (120.4 kg)   SpO2 99%   BMI 35.99 kg/m  Gen: NAD, resting comfortably HEENT: Mucous membranes are moist. Oropharynx normal. TM normal. Eyes: sclera and  lids normal, PERRLA Neck: no thyromegaly, no cervical lymphadenopathy CV: RRR no murmurs rubs or gallops Lungs: CTAB no crackles, wheeze, rhonchi Abdomen: soft/nontender/nondistended/normal bowel sounds. No rebound or guarding.  Ext: no edema Skin: warm, dry Neuro: 5/5 strength in upper and lower extremities, normal gait, normal reflexes walks in walking boot right foot- wound vac tubing and equipment noted. Did not undress.   Assessment/Plan:  HTN (hypertension) S: controlled on Ramipril 74m, hctz 12.5 mg. On ramipril for kidney protection for patient BP Readings from Last 3 Encounters:  11/17/16 108/70  10/30/16 138/83  10/27/16 117/84  A/P: We discussed blood pressure goal of <140/90. Continue current meds   Type II diabetes mellitus with neurological manifestations ([facility] S: Diagnosed September 2001.  Very well controlled. On Lantus 20-30 units at night.  Humgalog 4-10 units each meal.  Januvia 555mdaily.   Had been on metformin 17 years- was switched for unclear reasons other than being on it a long time.  CBGs- usally 80-140 at home. In last 3 months had one cbg at 6268 Exercise and diet- has been working on healthier lifestyle and believes this is helping Lab Results  Component Value Date   HGBA1C 5.7 (H) 10/27/2016   HGBA1C 5.3 07/29/2016   HGBA1C 6.4 05/10/2015   A/P: Very well controlled.  We discussed risks of hypoglycemia and I would rather have him on lower doses if recurrent hypoglycemia.  He agrees to monitor and let me know if he has recurrent issues.  Also has neuropathy not on treatment.  Likely contributed to amputation   Diabetic foot ulcer with osteomyelitis (HCNormannaStarted as blister and seen at prior PCPs office- antibiotics then changed to another with culture. 2 weeks later emergency room HP- IV antibiotic4th to 9th- spread to bone and in hospital 16 days. He has had a wound VAC since that time.  He is hoping to get a skin graft and get off of the wound  VAC on November 12th.  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  Not on treatment.  Denies significant pain.  Likely contributed to 4th and 5th ray removal  Morbid obesity ([place]) BMI over 35 with diabetes and hypertension.  He states weight is trending down.  Asked him to continue his efforts.  Hope would be 5 pounds off by follow-up though that is after the holidays  Dyslipidemia S: reasonably controlled on Atorvastatin 73m, tricor 471m No myalgias.  Lab Results  Component Value Date   CHOL 153 02/01/2016   HDL 38 02/01/2016   LDLCALC 95 02/01/2016   TRIG 90 02/01/2016   CHOLHDL 2.2 01/27/2007   A/P: Continue current medications though a atorvastatin dose is atypical   Elevated LFTs Lab Results  Component Value Date   ALT 22 08/13/2016   AST 17 08/13/2016   ALKPHOS 143 (H) 08/13/2016   BILITOT 0.5 08/13/2016  Last LFTs largely normal except for mild alk phos elevation.  Will monitor with labs at follow-up  Chronic kidney disease (CKD), stage III (moderate) (HCC) Not clear if due to diabetes or illness from hospitalized.  Appears GFR was above 60 before hospitalization.  Has nephrology follow-up planned apparently.  We will also plan to repeat a bmet at follow-up   Future Appointments Date Time Provider [place]11/07/2016 9:00 AM [facility] PAT 1 [facility] None  01/21/2017 9:00 AM [place] OlpMD [facility] None  needs to push back to 01/29/16 or later so we can repeat a1c   Orders Placed This Encounter  Procedures  . Flu Vaccine QUAD 36+ mos IM   Return precautions advised. StGarret ReddishMD

## 2016-11-17 NOTE — Assessment & Plan Note (Signed)
Noted- under plastic surgery care

## 2016-11-17 NOTE — Assessment & Plan Note (Signed)
BMI over 35 with diabetes and hypertension.  He states weight is trending down.  Asked him to continue his efforts.  Hope would be 5 pounds off by follow-up though that is after the holidays

## 2016-11-20 NOTE — Progress Notes (Signed)
I have called and spoken with the wife (on [place]) to move appt to after 01/28/17. No further action required.

## 2016-11-21 ENCOUNTER — Ambulatory Visit: Payer: Self-pay | Admitting: Plastic Surgery

## 2016-11-21 DIAGNOSIS — S91301D Unspecified open wound, right foot, subsequent encounter: Secondary | ICD-10-CM

## 2016-11-21 NOTE — Progress Notes (Signed)
Preop on 11/26/2016.  Surgery on 12/01/16.  Need orders in epic.  Thank You.

## 2016-11-25 NOTE — Patient Instructions (Addendum)
Aqib M [name]  11/25/2016   Your procedure is scheduled on: 12/01/2016    Report to [facility] Main  Entrance Take [place]  elevators to 3rd floor to  [place] at   [place] AM.     Call this number if you have problems the morning of surgery [phone]    Remember: ONLY 1 PERSON MAY GO WITH YOU TO SHORT STAY TO GET  READY MORNING OF YOUR SURGERY.  Do not eat food or drink liquids :After Midnight.     Take these medicines the morning of surgery with A SIP OF WATER: none  DO NOT TAKE ANY DIABETIC MEDICATIONS DAY OF YOUR SURGERY                               You may not have any metal on your body including hair pins and              piercings  Do not wear jewelry,  lotions, powders or perfumes, deodorant                         Men may shave face and neck.   Do not bring valuables to the hospital. [place].  Contacts, dentures or bridgework may not be worn into surgery.      Patients discharged the day of surgery will not be allowed to drive home.  Name and phone number of your driver:                Please read over the following fact sheets you were given: _____________________________________________________________________             [facility] - Preparing for Surgery Before surgery, you can play an important role.  Because skin is not sterile, your skin needs to be as free of germs as possible.  You can reduce the number of germs on your skin by washing with CHG (chlorahexidine gluconate) soap before surgery.  CHG is an antiseptic cleaner which kills germs and bonds with the skin to continue killing germs even after washing. Please DO NOT use if you have an allergy to CHG or antibacterial soaps.  If your skin becomes reddened/irritated stop using the CHG and inform your nurse when you arrive at Short Stay. Do not shave (including legs and underarms) for at least 48 hours prior to the first  CHG shower.  You may shave your face/neck. Please follow these instructions carefully:  1.  Shower with CHG Soap the night before surgery and the  morning of Surgery.  2.  If you choose to wash your hair, wash your hair first as usual with your  normal  shampoo.  3.  After you shampoo, rinse your hair and body thoroughly to remove the  shampoo.                           4.  Use CHG as you would any other liquid soap.  You can apply chg directly  to the skin and wash                       Gently with a  scrungie or clean washcloth.  5.  Apply the CHG Soap to your body ONLY FROM THE NECK DOWN.   Do not use on face/ open                           Wound or open sores. Avoid contact with eyes, ears mouth and genitals (private parts).                       Wash face,  Genitals (private parts) with your normal soap.             6.  Wash thoroughly, paying special attention to the area where your surgery  will be performed.  7.  Thoroughly rinse your body with warm water from the neck down.  8.  DO NOT shower/wash with your normal soap after using and rinsing off  the CHG Soap.                9.  Pat yourself dry with a clean towel.            10.  Wear clean pajamas.            11.  Place clean sheets on your bed the night of your first shower and do not  sleep with pets. Day of Surgery : Do not apply any lotions/deodorants the morning of surgery.  Please wear clean clothes to the hospital/surgery center.  FAILURE TO FOLLOW THESE INSTRUCTIONS MAY RESULT IN THE CANCELLATION OF YOUR SURGERY PATIENT SIGNATURE_________________________________  NURSE SIGNATURE__________________________________  ________________________________________________________________________ How to Manage Your Diabetes Before and After Surgery  Why is it important to control my blood sugar before and after surgery? . Improving blood sugar levels before and after surgery helps healing and can limit problems. . A way of  improving blood sugar control is eating a healthy diet by: o  Eating less sugar and carbohydrates o  Increasing activity/exercise o  Talking with your doctor about reaching your blood sugar goals . High blood sugars (greater than 180 mg/dL) can raise your risk of infections and slow your recovery, so you will need to focus on controlling your diabetes during the weeks before surgery. . Make sure that the doctor who takes care of your diabetes knows about your planned surgery including the date and location.  How do I manage my blood sugar before surgery? . Check your blood sugar at least 4 times a day, starting 2 days before surgery, to make sure that the level is not too high or low. o Check your blood sugar the morning of your surgery when you wake up and every 2 hours until you get to the Short Stay unit. . If your blood sugar is less than 70 mg/dL, you will need to treat for low blood sugar: o Do not take insulin. o Treat a low blood sugar (less than 70 mg/dL) with  cup of clear juice (cranberry or apple), 4 glucose tablets, OR glucose gel. o Recheck blood sugar in 15 minutes after treatment (to make sure it is greater than 70 mg/dL). If your blood sugar is not greater than 70 mg/dL on recheck, call [phone] for further instructions. . Report your blood sugar to the short stay nurse when you get to Short Stay.  . If you are admitted to the hospital after surgery: o Your blood sugar will be checked by the staff and you will probably be given insulin after  surgery (instead of oral diabetes medicines) to make sure you have good blood sugar levels. o The goal for blood sugar control after surgery is 80-180 mg/dL.   WHAT DO I DO ABOUT MY DIABETES MEDICATION?  Marland Kitchen Do not take oral diabetes medicines (pills) the morning of surgery.  . THE NIGHT BEFORE SURGERY, take  1/2 of evening dose of Lantus Insulin.        . THE MORNING OF SURGERY, take  No Insulin   .   Marland Kitchen     Patient  Signature:  Date:   Nurse Signature:  Date:   Reviewed and Endorsed by [facility] Patient Education Committee, August 2015

## 2016-11-26 ENCOUNTER — Encounter (HOSPITAL_COMMUNITY): Payer: Self-pay

## 2016-11-26 ENCOUNTER — Encounter (HOSPITAL_COMMUNITY)
Admission: RE | Admit: 2016-11-26 | Discharge: 2016-11-26 | Disposition: A | Discharge status: Home / Self Care | Payer: Managed Care, Other (non HMO) | Source: Ambulatory Visit | Attending: Plastic Surgery | Admitting: Plastic Surgery

## 2016-11-26 ENCOUNTER — Encounter (INDEPENDENT_AMBULATORY_CARE_PROVIDER_SITE_OTHER): Payer: Self-pay

## 2016-11-26 ENCOUNTER — Other Ambulatory Visit: Payer: Self-pay

## 2016-11-26 DIAGNOSIS — X58XXXD Exposure to other specified factors, subsequent encounter: Secondary | ICD-10-CM | POA: Diagnosis not present

## 2016-11-26 DIAGNOSIS — S91301D Unspecified open wound, right foot, subsequent encounter: Secondary | ICD-10-CM | POA: Diagnosis not present

## 2016-11-26 DIAGNOSIS — Z01812 Encounter for preprocedural laboratory examination: Secondary | ICD-10-CM | POA: Insufficient documentation

## 2016-11-26 LAB — BASIC METABOLIC PANEL
Anion gap: 7 (ref 5–15)
BUN: 21 mg/dL — ABNORMAL HIGH (ref 6–20)
CO2: 28 mmol/L (ref 22–32)
Calcium: 9.5 mg/dL (ref 8.9–10.3)
Chloride: 104 mmol/L (ref 101–111)
Creatinine, Ser: 1.34 mg/dL — ABNORMAL HIGH (ref 0.61–1.24)
GFR calc Af Amer: >60 mL/min (ref >60)
GFR calc non Af Amer: >60 mL/min (ref >60)
Glucose, Bld: 134 mg/dL — ABNORMAL HIGH (ref 65–99)
Potassium: 4.5 mmol/L (ref 3.5–5.1)
Sodium: 139 mmol/L (ref 135–145)

## 2016-11-26 LAB — CBC
HCT: 37 % — ABNORMAL LOW (ref 39.0–52.0)
Hemoglobin: 11.8 g/dL — ABNORMAL LOW (ref 13.0–17.0)
MCH: 25.1 pg — ABNORMAL LOW (ref 26.0–34.0)
MCHC: 31.9 g/dL (ref 30.0–36.0)
MCV: 78.6 fL (ref 78.0–100.0)
Platelets: 345 10*3/uL (ref 150–400)
RBC: 4.71 MIL/uL (ref 4.22–5.81)
RDW: 14.1 % (ref 11.5–15.5)
WBC: 7.1 10*3/uL (ref 4.0–10.5)

## 2016-11-26 LAB — GLUCOSE, CAPILLARY: Glucose-Capillary: 123 mg/dL — ABNORMAL HIGH (ref 65–99)

## 2016-11-26 NOTE — Progress Notes (Signed)
07/29/16-ekg-epic  cxr-07/28/16-epic

## 2016-11-26 NOTE — Progress Notes (Signed)
BMP done 11/26/16 routed via epic to Dr Audelia Hives.

## 2016-11-28 ENCOUNTER — Ambulatory Visit: Payer: Self-pay | Admitting: Plastic Surgery

## 2016-12-01 ENCOUNTER — Ambulatory Visit (HOSPITAL_COMMUNITY): Payer: Managed Care, Other (non HMO) | Admitting: Certified Registered Nurse Anesthetist

## 2016-12-01 ENCOUNTER — Ambulatory Visit (HOSPITAL_COMMUNITY)
Admission: RE | Admit: 2016-12-01 | Discharge: 2016-12-01 | Disposition: A | Discharge status: Home / Self Care | Payer: Managed Care, Other (non HMO) | Source: Ambulatory Visit | Attending: Plastic Surgery | Admitting: Plastic Surgery

## 2016-12-01 ENCOUNTER — Encounter (HOSPITAL_COMMUNITY): Payer: Self-pay

## 2016-12-01 ENCOUNTER — Encounter (HOSPITAL_COMMUNITY)
Admission: RE | Disposition: A | Discharge status: Home / Self Care | Payer: Self-pay | Source: Ambulatory Visit | Attending: Plastic Surgery

## 2016-12-01 DIAGNOSIS — Z794 Long term (current) use of insulin: Secondary | ICD-10-CM | POA: Insufficient documentation

## 2016-12-01 DIAGNOSIS — S91301D Unspecified open wound, right foot, subsequent encounter: Secondary | ICD-10-CM

## 2016-12-01 DIAGNOSIS — I129 Hypertensive chronic kidney disease with stage 1 through stage 4 chronic kidney disease, or unspecified chronic kidney disease: Secondary | ICD-10-CM | POA: Insufficient documentation

## 2016-12-01 DIAGNOSIS — E782 Mixed hyperlipidemia: Secondary | ICD-10-CM | POA: Insufficient documentation

## 2016-12-01 DIAGNOSIS — N183 Chronic kidney disease, stage 3 (moderate): Secondary | ICD-10-CM | POA: Diagnosis not present

## 2016-12-01 DIAGNOSIS — E1142 Type 2 diabetes mellitus with diabetic polyneuropathy: Secondary | ICD-10-CM | POA: Insufficient documentation

## 2016-12-01 DIAGNOSIS — M869 Osteomyelitis, unspecified: Secondary | ICD-10-CM | POA: Diagnosis not present

## 2016-12-01 DIAGNOSIS — E1169 Type 2 diabetes mellitus with other specified complication: Secondary | ICD-10-CM | POA: Diagnosis not present

## 2016-12-01 DIAGNOSIS — Z7982 Long term (current) use of aspirin: Secondary | ICD-10-CM | POA: Diagnosis not present

## 2016-12-01 DIAGNOSIS — E1122 Type 2 diabetes mellitus with diabetic chronic kidney disease: Secondary | ICD-10-CM | POA: Insufficient documentation

## 2016-12-01 DIAGNOSIS — Z79899 Other long term (current) drug therapy: Secondary | ICD-10-CM | POA: Insufficient documentation

## 2016-12-01 DIAGNOSIS — S91301A Unspecified open wound, right foot, initial encounter: Secondary | ICD-10-CM | POA: Diagnosis present

## 2016-12-01 DIAGNOSIS — E11621 Type 2 diabetes mellitus with foot ulcer: Secondary | ICD-10-CM | POA: Insufficient documentation

## 2016-12-01 HISTORY — PX: SKIN SPLIT GRAFT: SHX444

## 2016-12-01 HISTORY — PX: APPLICATION OF WOUND VAC: SHX5189

## 2016-12-01 LAB — GLUCOSE, CAPILLARY
Glucose-Capillary: 94 mg/dL (ref 65–99)
Glucose-Capillary: 88 mg/dL (ref 65–99)

## 2016-12-01 SURGERY — APPLICATION, GRAFT, SKIN, SPLIT-THICKNESS
Anesthesia: General | Site: Foot | Laterality: Right

## 2016-12-01 MED ORDER — LACTATED RINGERS IV SOLN
INTRAVENOUS | Status: DC | PRN
  Administered 2016-12-01: 07:00:00 via INTRAVENOUS

## 2016-12-01 MED ORDER — MINERAL OIL LIGHT 100 % EX OIL
TOPICAL_OIL | CUTANEOUS | Status: AC
  Filled 2016-12-01: qty 25

## 2016-12-01 MED ORDER — LIDOCAINE 2% (20 MG/ML) 5 ML SYRINGE
INTRAMUSCULAR | Status: AC
  Filled 2016-12-01: qty 5

## 2016-12-01 MED ORDER — MIDAZOLAM HCL 5 MG/5ML IJ SOLN
INTRAMUSCULAR | Status: DC | PRN
  Administered 2016-12-01: 2 mg via INTRAVENOUS

## 2016-12-01 MED ORDER — PROMETHAZINE HCL 25 MG/ML IJ SOLN: 6.2500 mg | INTRAMUSCULAR | Status: DC | PRN

## 2016-12-01 MED ORDER — EPINEPHRINE PF 1 MG/ML IJ SOLN
INTRAMUSCULAR | Status: AC
  Filled 2016-12-01: qty 1

## 2016-12-01 MED ORDER — LIDOCAINE 2% (20 MG/ML) 5 ML SYRINGE
INTRAMUSCULAR | Status: DC | PRN
  Administered 2016-12-01: 100 mg via INTRAVENOUS

## 2016-12-01 MED ORDER — ONDANSETRON HCL 4 MG/2ML IJ SOLN
INTRAMUSCULAR | Status: AC
  Filled 2016-12-01: qty 2

## 2016-12-01 MED ORDER — MINERAL OIL LIGHT 100 % EX OIL
TOPICAL_OIL | CUTANEOUS | Status: DC | PRN
  Administered 2016-12-01: 1 via TOPICAL

## 2016-12-01 MED ORDER — FENTANYL CITRATE (PF) 100 MCG/2ML IJ SOLN
INTRAMUSCULAR | Status: DC | PRN
  Administered 2016-12-01: 50 ug via INTRAVENOUS

## 2016-12-01 MED ORDER — OXYCODONE HCL 5 MG/5ML PO SOLN
5.0000 mg | Freq: Once | ORAL | Status: DC | PRN
  Filled 2016-12-01: qty 5

## 2016-12-01 MED ORDER — PROPOFOL 10 MG/ML IV BOLUS
INTRAVENOUS | Status: AC
  Filled 2016-12-01: qty 20

## 2016-12-01 MED ORDER — ONDANSETRON HCL 4 MG/2ML IJ SOLN
INTRAMUSCULAR | Status: DC | PRN
  Administered 2016-12-01: 4 mg via INTRAVENOUS

## 2016-12-01 MED ORDER — TISSEEL VH 10 ML EX KIT
PACK | CUTANEOUS | Status: AC
  Filled 2016-12-01: qty 1

## 2016-12-01 MED ORDER — PROPOFOL 10 MG/ML IV BOLUS
INTRAVENOUS | Status: DC | PRN
  Administered 2016-12-01: 200 mg via INTRAVENOUS

## 2016-12-01 MED ORDER — SODIUM CHLORIDE 0.9 % IR SOLN
Status: DC | PRN
Start: 2016-12-01 — End: 2016-12-01
  Administered 2016-12-01: 1000 mL

## 2016-12-01 MED ORDER — MIDAZOLAM HCL 2 MG/2ML IJ SOLN
INTRAMUSCULAR | Status: AC
  Filled 2016-12-01: qty 2

## 2016-12-01 MED ORDER — BUPIVACAINE-EPINEPHRINE 0.25% -1:200000 IJ SOLN
INTRAMUSCULAR | Status: AC
  Filled 2016-12-01: qty 1

## 2016-12-01 MED ORDER — LACTATED RINGERS IV SOLN
INTRAVENOUS | Status: DC
  Administered 2016-12-01: 10:00:00 via INTRAVENOUS

## 2016-12-01 MED ORDER — HYDROMORPHONE HCL 1 MG/ML IJ SOLN: 0.2500 mg | INTRAMUSCULAR | Status: DC | PRN

## 2016-12-01 MED ORDER — OXYCODONE HCL 5 MG PO TABS: 5.0000 mg | ORAL_TABLET | Freq: Once | ORAL | Status: DC | PRN

## 2016-12-01 MED ORDER — POLYMYXIN B SULFATE 500000 UNITS IJ SOLR
INTRAMUSCULAR | Status: DC | PRN
  Administered 2016-12-01: 500 mL

## 2016-12-01 MED ORDER — SODIUM CHLORIDE 0.9 % IV SOLN
INTRAVENOUS | Status: AC
  Filled 2016-12-01: qty 500000

## 2016-12-01 MED ORDER — HYDROCODONE-ACETAMINOPHEN 5-325 MG PO TABS: 1.0000 | ORAL_TABLET | ORAL | 0 refills | Status: DC | PRN

## 2016-12-01 MED ORDER — FENTANYL CITRATE (PF) 100 MCG/2ML IJ SOLN
INTRAMUSCULAR | Status: AC
  Filled 2016-12-01: qty 2

## 2016-12-01 MED ORDER — CEFAZOLIN SODIUM-DEXTROSE 2-4 GM/100ML-% IV SOLN
2.0000 g | INTRAVENOUS | Status: AC
  Administered 2016-12-01: 2 g via INTRAVENOUS

## 2016-12-01 MED ORDER — HYDROCODONE-ACETAMINOPHEN 5-325 MG PO TABS: 1.0000 | ORAL_TABLET | Freq: Four times a day (QID) | ORAL | 0 refills | Status: DC | PRN

## 2016-12-01 MED ORDER — BUPIVACAINE-EPINEPHRINE 0.25% -1:200000 IJ SOLN
INTRAMUSCULAR | Status: DC | PRN
  Administered 2016-12-01: 10 mL

## 2016-12-01 MED ORDER — CEFAZOLIN SODIUM-DEXTROSE 2-4 GM/100ML-% IV SOLN
INTRAVENOUS | Status: AC
  Filled 2016-12-01: qty 100

## 2016-12-01 SURGICAL SUPPLY — 48 items
APPLIER CLIP 11 MED OPEN (CLIP)
APR CLP MED 11 20 MLT OPN (CLIP)
BAG SPEC THK2 15X12 ZIP CLS (MISCELLANEOUS) ×2
BAG ZIPLOCK 12X15 (MISCELLANEOUS) ×4 IMPLANT
BANDAGE ACE 4X5 VEL STRL LF (GAUZE/BANDAGES/DRESSINGS) ×6 IMPLANT
BANDAGE ACE 6X5 VEL STRL LF (GAUZE/BANDAGES/DRESSINGS) IMPLANT
BLADE CLIPPER SURG (BLADE) ×4 IMPLANT
BLADE DERMATOME SS (BLADE) ×4 IMPLANT
BNDG GAUZE ELAST 4 BULKY (GAUZE/BANDAGES/DRESSINGS) ×4 IMPLANT
CLIP APPLIE 11 MED OPEN (CLIP) IMPLANT
COVER MAYO STAND STRL (DRAPES) ×2 IMPLANT
COVER SURGICAL LIGHT HANDLE (MISCELLANEOUS) ×4 IMPLANT
DEPRESSOR TONGUE BLADE STERILE (MISCELLANEOUS) ×8 IMPLANT
DERMACARRIERS GRAFT 1 TO 1.5 (DISPOSABLE) ×4
DRAPE LAPAROTOMY T 102X78X121 (DRAPES) ×4 IMPLANT
DRESSING DUODERM 4X4 STERILE (GAUZE/BANDAGES/DRESSINGS) ×2 IMPLANT
DRSG ADAPTIC 3X8 NADH LF (GAUZE/BANDAGES/DRESSINGS) IMPLANT
DRSG EMULSION OIL 3X3 NADH (GAUZE/BANDAGES/DRESSINGS) ×2 IMPLANT
DRSG PAD ABDOMINAL 8X10 ST (GAUZE/BANDAGES/DRESSINGS) IMPLANT
DRSG VAC ATS SM SENSATRAC (GAUZE/BANDAGES/DRESSINGS) ×2 IMPLANT
ELECT REM PT RETURN 15FT ADLT (MISCELLANEOUS) IMPLANT
GAUZE SPONGE 4X4 12PLY STRL (GAUZE/BANDAGES/DRESSINGS) ×4 IMPLANT
GAUZE XEROFORM 5X9 LF (GAUZE/BANDAGES/DRESSINGS) ×2 IMPLANT
GLOVE BIO SURGEON STRL SZ 6.5 (GLOVE) ×1 IMPLANT
GLOVE BIO SURGEONS STRL SZ 6.5 (GLOVE) ×1
GLOVE BIOGEL PI IND STRL 7.0 (GLOVE) IMPLANT
GLOVE BIOGEL PI INDICATOR 7.0 (GLOVE) ×2
GOWN STRL REUS W/TWL LRG LVL3 (GOWN DISPOSABLE) ×2 IMPLANT
GRAFT DERMACARRIERS 1 TO 1.5 (DISPOSABLE) ×2 IMPLANT
HANDPIECE INTERPULSE COAX TIP (DISPOSABLE)
KIT BASIN OR (CUSTOM PROCEDURE TRAY) ×4 IMPLANT
MANIFOLD NEPTUNE II (INSTRUMENTS) ×4 IMPLANT
NS IRRIG 1000ML POUR BTL (IV SOLUTION) ×4 IMPLANT
PACK ORTHO EXTREMITY (CUSTOM PROCEDURE TRAY) ×4 IMPLANT
PAD ABD 8X10 STRL (GAUZE/BANDAGES/DRESSINGS) ×2 IMPLANT
PADDING CAST COTTON 6X4 STRL (CAST SUPPLIES) IMPLANT
SET HNDPC FAN SPRY TIP SCT (DISPOSABLE) IMPLANT
SPLINT FIBERGLASS 3X35 (CAST SUPPLIES) ×2 IMPLANT
STAPLER VISISTAT 35W (STAPLE) ×4 IMPLANT
SUT PDS AB 5-0 PS2 (SUTURE) ×2 IMPLANT
SUT VIC AB 2-0 SH 18 (SUTURE) IMPLANT
SUT VIC AB 5-0 PS2 18 (SUTURE) ×4 IMPLANT
TAPE CLOTH SURG 4X10 WHT LF (GAUZE/BANDAGES/DRESSINGS) ×2 IMPLANT
TOWEL OR 17X26 10 PK STRL BLUE (TOWEL DISPOSABLE) ×4 IMPLANT
TOWEL OR NON WOVEN STRL DISP B (DISPOSABLE) ×2 IMPLANT
UNDERPAD 30X30 (UNDERPADS AND DIAPERS) ×4 IMPLANT
WATER STERILE IRR 1000ML POUR (IV SOLUTION) ×4 IMPLANT
YANKAUER SUCT BULB TIP 10FT TU (MISCELLANEOUS) ×2 IMPLANT

## 2016-12-01 NOTE — Anesthesia Preprocedure Evaluation (Signed)
Anesthesia Evaluation  Patient identified by MRN, date of birth, ID band Patient awake    Reviewed: Allergy & Precautions, NPO status , Patient's Chart, lab work & pertinent test results  Airway Mallampati: II  TM Distance: >3 FB Neck ROM: Full    Dental no notable dental hx. (+) Teeth Intact, Dental Advisory Given   Pulmonary    breath sounds clear to auscultation       Cardiovascular hypertension, Pt. on medications  Rhythm:Regular Rate:Normal     Neuro/Psych  Neuromuscular disease    GI/Hepatic negative GI ROS, Neg liver ROS,   Endo/Other  diabetes, Poorly Controlled, Oral Hypoglycemic Agents, Insulin DependentMorbid obesity  Renal/GU Renal InsufficiencyRenal disease     Musculoskeletal   Abdominal (+) + obese,   Peds  Hematology  (+) anemia ,   Anesthesia Other Findings   Reproductive/Obstetrics                             Anesthesia Physical  Anesthesia Plan  ASA: III  Anesthesia Plan: General   Post-op Pain Management:    Induction: Intravenous  PONV Risk Score and Plan: 3 and Ondansetron, Midazolam and Treatment may vary due to age or medical condition  Airway Management Planned: LMA  Additional Equipment:   Intra-op Plan:   Post-operative Plan: Extubation in OR  Informed Consent: I have reviewed the patients History and Physical, chart, labs and discussed the procedure including the risks, benefits and alternatives for the proposed anesthesia with the patient or authorized representative who has indicated his/her understanding and acceptance.     Plan Discussed with: CRNA  Anesthesia Plan Comments:         Anesthesia Quick Evaluation

## 2016-12-01 NOTE — Anesthesia Procedure Notes (Signed)
Procedure Name: LMA Insertion Date/Time: 12/01/2016 7:35 AM Performed by: [place] Jaquin Coy C, CRNA Pre-anesthesia Checklist: Patient identified, Emergency Drugs available, Suction available and Patient being monitored Patient Re-evaluated:Patient Re-evaluated prior to induction Oxygen Delivery Method: Circle system utilized Preoxygenation: Pre-oxygenation with 100% oxygen Induction Type: IV induction Ventilation: Mask ventilation without difficulty LMA: LMA inserted LMA Size: 5.0 Number of attempts: 1 Airway Equipment and Method: Bite block Placement Confirmation: positive ETCO2 Tube secured with: Tape Dental Injury: Teeth and Oropharynx as per pre-operative assessment

## 2016-12-01 NOTE — Discharge Instructions (Signed)
Keep VAC in place. Do not change. Doctor will change in the office. Change outer dressing on the right thigh with gauze daily.

## 2016-12-01 NOTE — Transfer of Care (Signed)
Immediate Anesthesia Transfer of Care Note  Patient: [name]  Procedure(s) Performed: SKIN GRAFT SPLIT THICKNESS TO RIGHT LATERAL FOOT WOUND 9 X 3 CM (Right ) APPLICATION OF WOUND VAC (Right Foot)  Patient Location: PACU  Anesthesia Type:General  Level of Consciousness: awake, alert  and oriented  Airway & Oxygen Therapy: Patient Spontanous Breathing and Patient connected to face mask oxygen  Post-op Assessment: Report given to RN and Post -op Vital signs reviewed and stable  Post vital signs: Reviewed and stable  Last Vitals:  Vitals:   12/01/16 0543  BP: 108/76  Pulse: 78  Resp: 18  Temp: 36.8 C  SpO2: 100%    Last Pain:  Vitals:   12/01/16 0543  TempSrc: Oral         Complications: No apparent anesthesia complications

## 2016-12-01 NOTE — H&P (Signed)
[name] is an 46 y.o. male.   Chief Complaint: right foot wound HPI: The patient is a 46 yrs old bm here for treatment of his right foot wound.  He underwent several debridements in the past with Acell placement.  He has been treating it with the VAC at home.  There is good granulation tissue and no sign of ongoing infection.  Plan for coverage today.    Past Medical History:  Diagnosis Date  . CKD (chronic kidney disease), stage III [facility])    nephrologist-  dr [name] ([facility] in [place])    . Diabetic foot ulcer with osteomyelitis ([place])    right lateral foot--- s/p  amputation 4th and 5th ray , ACELL , Wound VAC  . Diabetic peripheral neuropathy ([place])   . Hypertension   . Iron deficiency anemia   . Mixed hyperlipidemia   . Type 2 diabetes mellitus treated with insulin ([place])    last A1c 5.3 on 07-29-2016    Past Surgical History:  Procedure Laterality Date  . ANAL FISSURE REPAIR  2000  . CIRCUMCISION  2006  . LAPAROSCOPIC CHOLECYSTECTOMY  09-09-2006   dr Zella Richer  . wisdom teeth extracted  1984    Family History  Problem Relation Age of Onset  . Stroke Father        early 22s. former smoker  . Diabetes Father   . Hypertension Father   . Sarcoidosis Mother   . Hypertension Sister   . Healthy Daughter   . Diabetes Paternal Grandmother   . Congenital heart disease Paternal Grandmother        died of complications   Social History:  reports that  has never smoked. he has never used smokeless tobacco. He reports that he does not drink alcohol or use drugs.  Allergies: No Known Allergies  Medications Prior to Admission  Medication Sig Dispense Refill  . aspirin EC 81 MG tablet Take 81 mg by mouth daily.    Marland Kitchen atorvastatin (LIPITOR) 40 MG tablet Take 60 mg by mouth every morning.     . Cyanocobalamin (VITAMIN B-12) 5000 MCG SUBL Place 5,000 mcg under the tongue daily.    . feeding supplement, GLUCERNA SHAKE, (GLUCERNA SHAKE) LIQD Take 237  mLs by mouth 2 (two) times daily between meals. (Patient taking differently: Take 237 mLs by mouth 2 (two) times daily as needed (meal supplement). ) 60 Can 0  . fenofibrate (TRICOR) 48 MG tablet Take 1 tablet (48 mg total) by mouth daily. (Patient taking differently: Take 48 mg by mouth every morning. ) 30 tablet 0  . ferrous sulfate 325 (65 FE) MG tablet Take 1 tablet (325 mg total) by mouth 2 (two) times daily with a meal. 60 tablet 3  . hydrochlorothiazide (MICROZIDE) 12.5 MG capsule Take 12.5 mg by mouth every morning.   3  . insulin glargine (LANTUS) 100 UNIT/ML injection Inject 0.2 mLs (20 Units total) into the skin at bedtime. (Patient taking differently: Inject 20-26 Units into the skin at bedtime. Depends on blood sugar readings) 10 mL 11  . insulin lispro (HUMALOG) 100 UNIT/ML injection Inject 0.04 mLs (4 Units total) into the skin 2 (two) times daily. (Patient taking differently: Inject 4-8 Units into the skin 2 (two) times daily. Per sliding scale) 10 mL 11  . Multiple Vitamin (MULTIVITAMIN WITH MINERALS) TABS tablet Take 1 tablet by mouth 2 (two) times daily.    . Omega-3 Fatty Acids (FISH OIL) 1360 MG CAPS Take 1,360  mg by mouth daily.     Marland Kitchen pyridoxine (B-6) 100 MG tablet Take 100 mg by mouth 2 (two) times daily.    . ramipril (ALTACE) 5 MG capsule Take 5 mg by mouth every morning.     . sitaGLIPtin (JANUVIA) 50 MG tablet Take 50 mg by mouth every morning.     . vitamin C (VITAMIN C) 500 MG tablet Take 1 tablet (500 mg total) by mouth daily. (Patient taking differently: Take 500 mg by mouth 2 (two) times daily. ) 30 tablet 0    Results for orders placed or performed during the hospital encounter of 12/01/16 (from the past 48 hour(s))  Glucose, capillary     Status: None   Collection Time: 12/01/16  5:46 AM  Result Value Ref Range   Glucose-Capillary 94 65 - 99 mg/dL   No results found.  Review of Systems  Constitutional: Negative.  Negative for fever.  HENT: Negative.   Eyes:  Negative.   Respiratory: Negative.   Cardiovascular: Negative.   Gastrointestinal: Negative.   Genitourinary: Negative.   Musculoskeletal: Negative.   Skin: Negative.   Neurological: Negative.   Psychiatric/Behavioral: Negative.     Blood pressure 108/76, pulse 78, temperature 98.3 F (36.8 C), temperature source Oral, resp. rate 18, height 6' (1.829 m), weight 118.4 kg (261 lb), SpO2 100 %. Physical Exam  Constitutional: He is oriented to person, place, and time. He appears well-developed and well-nourished.  HENT:  Head: Normocephalic and atraumatic.  Eyes: EOM are normal. Pupils are equal, round, and reactive to light.  Cardiovascular: Normal rate and regular rhythm.  Respiratory: Effort normal. No respiratory distress.  GI: Soft.  Musculoskeletal: He exhibits tenderness.  Neurological: He is alert and oriented to person, place, and time.  Skin: Skin is warm.  Psychiatric: He has a normal mood and affect. His behavior is normal. Judgment and thought content normal.     Assessment/Plan Plan for split thickness skin graft to the right foot wound with VAC placement.  Wallace Going, DO 12/01/2016, 7:06 AM

## 2016-12-01 NOTE — Op Note (Signed)
DATE OF OPERATION: 12/01/2016  LOCATION: [facility]  PREOPERATIVE DIAGNOSIS: Right foot wound  3 x 7 cm  POSTOPERATIVE DIAGNOSIS: Same  PROCEDURE: Placement of Split thickness skin graft (3 x 7 cm) to the right foot with VAC placement  SURGEON: Aleesha Ringstad [place] Alexianna Nachreiner, DO  EBL: 5 cc  CONDITION: Stable  COMPLICATIONS: None  INDICATION: The patient, [name], is a 46 y.o. male born on [birth year], is here for treatment of a right foot wound.  He initially had an infection and wound that required a ray amputation.  We were consulted for wound care.  He has improved greatly and now is ready for a graft.   PROCEDURE DETAILS:  The patient was seen prior to surgery and marked.  The IV antibiotics were given. The patient was taken to the operating room and given a general anesthetic. A standard time out was performed and all information was confirmed by those in the room. SCD was placed on the left leg.   The right leg was prepped and draped in the usual sterile fashion.  The right thigh was injected with local for intraoperative hemostasis and postoperative pain control.  The dermatome was set at 12/998 inch.  The 3 x 7 cm graft was obtained.  The xeroform was placed on the thigh with a sterile dressing.  The foot was irrigated with antibiotic and saline solution.  The edges were prepared with the #15 blade and the base with the curette. Slits were placed in the graft to prevent fluid retention under the graft. The graft was then placed on the foot and secured in place with the 5-0 Vicryl.  The adapatic was applied over the graft and then the VAC.  There was an excellent seal.  The VAC was set at 125 mmHg pressure.  The leg was wrapped and placed in a plantar blocking splint for protection.  The patient was allowed to wake up and taken to recovery room in stable condition at the end of the case. The family was notified at the end of the case.

## 2016-12-01 NOTE — Anesthesia Postprocedure Evaluation (Signed)
Anesthesia Post Note  Patient: [name]  Procedure(s) Performed: SKIN GRAFT SPLIT THICKNESS TO RIGHT LATERAL FOOT WOUND 9 X 3 CM (Right ) APPLICATION OF WOUND VAC (Right Foot)     Anesthesia Post Evaluation  Last Vitals:  Vitals:   12/01/16 0930 12/01/16 0958  BP: (!) 145/88 127/74  Pulse: 64 66  Resp: 12 12  Temp: 36.6 C 36.6 C  SpO2: 100% 100%    Last Pain:  Vitals:   12/01/16 0930  TempSrc:   PainSc: 0-No pain                 Lynda Rainwater

## 2016-12-02 ENCOUNTER — Encounter (HOSPITAL_COMMUNITY): Payer: Self-pay | Admitting: Plastic Surgery

## 2016-12-08 DIAGNOSIS — Z95828 Presence of other vascular implants and grafts: Secondary | ICD-10-CM | POA: Insufficient documentation

## 2016-12-08 DIAGNOSIS — Z945 Skin transplant status: Secondary | ICD-10-CM | POA: Insufficient documentation

## 2016-12-17 ENCOUNTER — Telehealth: Payer: Self-pay | Admitting: Family Medicine

## 2016-12-17 ENCOUNTER — Other Ambulatory Visit: Payer: Self-pay

## 2016-12-17 ENCOUNTER — Telehealth: Payer: Self-pay

## 2016-12-17 MED ORDER — SITAGLIPTIN PHOSPHATE 50 MG PO TABS: 50.0000 mg | ORAL_TABLET | ORAL | 5 refills | Status: DC

## 2016-12-17 NOTE — Telephone Encounter (Signed)
Copied from [place] #[id number]. Topic: Quick Communication - See Telephone Encounter >> Dec 17, 2016  1:04 PM Corie Chiquito, [place] wrote: CRM for notification. See Telephone encounter for: Pharmacy called because they need a prior authorization for Januvia. Pharmacy will be faxing over the paperwork as well. If someone could give them a call back at [phone]  12/17/16.

## 2016-12-17 NOTE — Telephone Encounter (Signed)
PA for Januvia initiated through Cover My Meds.  Awaiting insurance decision.

## 2016-12-17 NOTE — Telephone Encounter (Signed)
Copied from [place] [phone]. Topic: Inquiry >> Dec 17, 2016 10:54 AM Pricilla Handler wrote: Reason for CRM: Patient called requesting a refill of sitaGLIPtin (JANUVIA) 50 MG tablet. Patient wants the prescription sent to [place], [place] - [address] [phone] (Phone) [phone] (Fax).

## 2016-12-17 NOTE — Telephone Encounter (Signed)
PA for Januvia approved.  Patient's pharmacy notified.

## 2016-12-17 NOTE — Telephone Encounter (Signed)
See attached

## 2016-12-18 ENCOUNTER — Ambulatory Visit: Payer: Self-pay | Admitting: Podiatry

## 2016-12-18 NOTE — Telephone Encounter (Signed)
PA has already been completed and approved.  Pharmacy will be notified today.  See prior telephone message.

## 2016-12-24 ENCOUNTER — Ambulatory Visit (INDEPENDENT_AMBULATORY_CARE_PROVIDER_SITE_OTHER): Payer: Managed Care, Other (non HMO) | Admitting: Podiatry

## 2016-12-24 ENCOUNTER — Encounter: Payer: Self-pay | Admitting: Podiatry

## 2016-12-24 ENCOUNTER — Other Ambulatory Visit: Payer: Self-pay | Admitting: Podiatry

## 2016-12-24 ENCOUNTER — Ambulatory Visit (INDEPENDENT_AMBULATORY_CARE_PROVIDER_SITE_OTHER): Payer: Managed Care, Other (non HMO)

## 2016-12-24 DIAGNOSIS — E1140 Type 2 diabetes mellitus with diabetic neuropathy, unspecified: Secondary | ICD-10-CM | POA: Diagnosis not present

## 2016-12-24 DIAGNOSIS — E11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ical complication: Secondary | ICD-10-CM

## 2016-12-24 DIAGNOSIS — S98131A Complete traumatic amputation of one right lesser toe, initial encounter: Secondary | ICD-10-CM | POA: Diagnosis not present

## 2016-12-24 DIAGNOSIS — M79671 Pain in right foot: Secondary | ICD-10-CM | POA: Diagnosis not present

## 2016-12-24 DIAGNOSIS — M79672 Pain in left foot: Secondary | ICD-10-CM

## 2016-12-24 NOTE — Progress Notes (Signed)
   Subjective:    Patient ID: [name], male    DOB: [birth year], 46 y.o.   MRN: [id number]  HPI    Review of Systems  All other systems reviewed and are negative.      Objective:   Physical Exam        Assessment & Plan:

## 2016-12-25 NOTE — Progress Notes (Signed)
Subjective:   Patient ID: [name], male   DOB: 46 y.o.   MRN: [id number]   HPI Patient presents stating he was just concerned about some irritation with spot of his left foot with long-term diabetes and history of amputation of his right fifth metatarsal and fourth and fifth toes.  He still is healing on the right foot and is being treated by a wound care doctor and is just concerned and does not want to develop further breakdown as this is taken 6 months to begin to heal with again extensive amputation   Review of Systems  All other systems reviewed and are negative.       Objective:  Physical Exam  Constitutional: He appears well-developed and well-nourished.  Cardiovascular: Intact distal pulses.  Pulmonary/Chest: Effort normal.  Musculoskeletal: Normal range of motion.  Neurological: He is alert.  Skin: Skin is warm.  Nursing note and vitals reviewed.   Vascular status was found to be slightly diminished but intact and is followed by a physician with patient noted to have significant loss of neuropathic function with diminished sharp dull and vibratory.  Patient does have good digital perfusion and is noted to have an open area on the right fifth metatarsal base being treated currently the wound care center.  On the left foot I did note some abrasive tissue on the plantar aspect of the left fifth metatarsal with no drainage keratotic lesion or anything that indicates a pathological process.  Patient is putting more weight on the left foot during the healing of the right foot.     Assessment:  Probability that this is just some irritated tissue secondary to excessive weightbearing with patient who is at extreme risk for further amputation and pathology with no guarantee the right foot isgoing to  heal.     Plan:  H&P and x-rays performed and discussed.  At this point I have recommended orthotics to try to offload the fifth metatarsal left and try to accommodate the  plantar right foot which has been now been mal position secondary to previous surgery.  Will have these made and is given strict instructions for daily inspections of his feet and if any breakdown were to occur to reappoint immediately to the doctor and to our office.  X-rays indicate that there is been resection of fifth metatarsal fourth metatarsal and fourth and fifth toes right with no indication of current osteomyelitic change in the left foot does not indicate ostial lysis or subcutaneous issues

## 2017-01-05 ENCOUNTER — Ambulatory Visit: Payer: Managed Care, Other (non HMO) | Admitting: Orthotics

## 2017-01-05 DIAGNOSIS — S98131A Complete traumatic amputation of one right lesser toe, initial encounter: Secondary | ICD-10-CM

## 2017-01-05 NOTE — Progress Notes (Signed)
Patient was not cast today due to open wound rt foot which has not healed properly since 4th/5th ray amputation in July; Patient is currently being treated at wound center and I told him I would cast him as soon as wound closed and not at risk for further infection during the casting process.

## 2017-01-07 ENCOUNTER — Other Ambulatory Visit: Payer: Managed Care, Other (non HMO) | Admitting: Orthotics

## 2017-01-21 ENCOUNTER — Ambulatory Visit: Payer: Self-pay | Admitting: Family Medicine

## 2017-01-30 ENCOUNTER — Ambulatory Visit: Payer: Managed Care, Other (non HMO) | Admitting: Podiatry

## 2017-01-30 ENCOUNTER — Encounter: Payer: Self-pay | Admitting: Podiatry

## 2017-01-30 DIAGNOSIS — L03032 Cellulitis of left toe: Secondary | ICD-10-CM

## 2017-01-30 DIAGNOSIS — S98131A Complete traumatic amputation of one right lesser toe, initial encounter: Secondary | ICD-10-CM | POA: Diagnosis not present

## 2017-01-30 DIAGNOSIS — L02612 Cutaneous abscess of left foot: Secondary | ICD-10-CM | POA: Diagnosis not present

## 2017-01-30 NOTE — Progress Notes (Signed)
Subjective:   Patient ID: [name], male   DOB: 47 y.o.   MRN: [id number]   HPI Patient presents stating he's return to work is wearing steel's toed shoes and is felt a blister on the left big toe with history of amputation and diabetes of long-term nature   ROS      Objective:  Physical Exam  Neurovascular status unchanged with blister of the left interphalangeal joint that's localized with no proximal edema erythema or drainage noted     Assessment:  At risk patient with blister of the left interphalangeal joint which is localized and probably due to trauma     Plan:  Sterile release of the abscess left flushed the area out and applied Silvadene dressing and instructed on cushioning the digits when wearing steel toe shoes and if any redness or any other issues were to occur patient is to reappoint immediately

## 2017-02-02 ENCOUNTER — Ambulatory Visit: Payer: Managed Care, Other (non HMO) | Admitting: Family Medicine

## 2017-02-02 ENCOUNTER — Encounter: Payer: Self-pay | Admitting: Family Medicine

## 2017-02-02 VITALS — BP 106/78 | HR 75 | Temp 98.2°F | Ht 72.0 in | Wt 282.0 lb

## 2017-02-02 DIAGNOSIS — N183 Chronic kidney disease, stage 3 unspecified: Secondary | ICD-10-CM

## 2017-02-02 DIAGNOSIS — I10 Essential (primary) hypertension: Secondary | ICD-10-CM | POA: Diagnosis not present

## 2017-02-02 DIAGNOSIS — E11621 Type 2 diabetes mellitus with foot ulcer: Secondary | ICD-10-CM

## 2017-02-02 DIAGNOSIS — E11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ical complication: Secondary | ICD-10-CM

## 2017-02-02 DIAGNOSIS — L97412 Non-pressure chronic ulcer of right heel and midfoot with fat layer exposed: Secondary | ICD-10-CM

## 2017-02-02 DIAGNOSIS — E785 Hyperlipidemia, unspecified: Secondary | ICD-10-CM | POA: Diagnosis not present

## 2017-02-02 DIAGNOSIS — Z23 Encounter for immunization: Secondary | ICD-10-CM

## 2017-02-02 DIAGNOSIS — Z89421 Acquired absence of other right toe(s): Secondary | ICD-10-CM

## 2017-02-02 LAB — CBC
HCT: 37.1 % — ABNORMAL LOW (ref 39.0–52.0)
Hemoglobin: 11.8 g/dL — ABNORMAL LOW (ref 13.0–17.0)
MCHC: 31.9 g/dL (ref 30.0–36.0)
MCV: 79.5 fl (ref 78.0–100.0)
Platelets: 390 10*3/uL (ref 150.0–400.0)
RBC: 4.66 Mil/uL (ref 4.22–5.81)
RDW: 14.7 % (ref 11.5–15.5)
WBC: 7.8 10*3/uL (ref 4.0–10.5)

## 2017-02-02 LAB — HEMOGLOBIN A1C: Hgb A1c MFr Bld: 6.3 % (ref 4.6–6.5)

## 2017-02-02 LAB — COMPREHENSIVE METABOLIC PANEL
ALT: 42 U/L (ref 0–53)
AST: 35 U/L (ref 0–37)
Albumin: 4.1 g/dL (ref 3.5–5.2)
Alkaline Phosphatase: 109 U/L (ref 39–117)
BUN: 25 mg/dL — ABNORMAL HIGH (ref 6–23)
CO2: 29 mEq/L (ref 19–32)
Calcium: 9.8 mg/dL (ref 8.4–10.5)
Chloride: 104 mEq/L (ref 96–112)
Creatinine, Ser: 1.47 mg/dL (ref 0.40–1.50)
GFR: 66.09 mL/min (ref >60.00)
Glucose, Bld: 56 mg/dL — ABNORMAL LOW (ref 70–99)
Potassium: 3.7 mEq/L (ref 3.5–5.1)
Sodium: 140 mEq/L (ref 135–145)
Total Bilirubin: 0.6 mg/dL (ref 0.2–1.2)
Total Protein: 8.4 g/dL — ABNORMAL HIGH (ref 6.0–8.3)

## 2017-02-02 LAB — LDL CHOLESTEROL, DIRECT: Direct LDL: 61 mg/dL

## 2017-02-02 NOTE — Progress Notes (Signed)
Subjective:  [name] is a 47 y.o. year old very pleasant male patient who presents for/with See problem oriented charting ROS- No chest pain or shortness of breath. No headache or blurry vision. No hypoglycemia.    Past Medical History-  Patient Active Problem List   Diagnosis Date Noted  . Diabetic foot ulcer with osteomyelitis ([place]) 07/29/2016    Priority: High  . Morbid obesity ([place]) 03/08/2015    Priority: High  . Type II diabetes mellitus with neurological manifestations ([place]) 03/07/2015    Priority: High  . Chronic kidney disease (CKD), stage III (moderate) ([place]) 11/17/2016    Priority: Medium  . Diabetic neuropathy ([place]) 11/17/2016    Priority: Medium  . Elevated LFTs     Priority: Medium  . Dyslipidemia 03/08/2015    Priority: Medium  . HTN (hypertension) 03/08/2015    Priority: Medium  . B12 deficiency 11/17/2016    Priority: Low  . Iron deficiency anemia 11/17/2016    Priority: Low  . 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 aspirin EC 81 MG tablet Take 81 mg by mouth daily.    Marland Kitchen atorvastatin (LIPITOR) 40 MG tablet Take 60 mg by mouth every morning.     . Cyanocobalamin (VITAMIN B-12) 5000 MCG SUBL Place 5,000 mcg under the tongue daily.    . feeding supplement, GLUCERNA SHAKE, (GLUCERNA SHAKE) LIQD Take 237 mLs by mouth 2 (two) times daily between meals. (Patient taking differently: Take 237 mLs by mouth 2 (two) times daily as needed (meal supplement). ) 60 Can 0  . fenofibrate (TRICOR) 48 MG tablet Take 1 tablet (48 mg total) by mouth daily. (Patient taking differently: Take 48 mg by mouth every morning. ) 30 tablet 0  . ferrous sulfate 325 (65 FE) MG tablet Take 1 tablet (325 mg total) by mouth 2  (two) times daily with a meal. 60 tablet 3  . hydrochlorothiazide (MICROZIDE) 12.5 MG capsule Take 12.5 mg by mouth every morning.   3  . insulin glargine (LANTUS) 100 UNIT/ML injection Inject 0.2 mLs (20 Units total) into the skin at bedtime. (Patient taking differently: Inject 20-26 Units into the skin at bedtime. Depends on blood sugar readings) 10 mL 11  . insulin lispro (HUMALOG) 100 UNIT/ML injection Inject 0.04 mLs (4 Units total) into the skin 2 (two) times daily. (Patient taking differently: Inject 4-8 Units into the skin 2 (two) times daily. Per sliding scale) 10 mL 11  . Multiple Vitamin (MULTIVITAMIN WITH MINERALS) TABS tablet Take 1 tablet by mouth 2 (two) times daily.    . Omega-3 Fatty Acids (FISH OIL) 1360 MG CAPS Take 1,360 mg by mouth daily.     Marland Kitchen pyridoxine (B-6) 100 MG tablet Take 100 mg by mouth 2 (two) times daily.    . ramipril (ALTACE) 5 MG capsule Take 5 mg by mouth every morning.     . sitaGLIPtin (JANUVIA) 50 MG tablet Take 1 tablet (50 mg total) by mouth every morning. 30 tablet 5  . vitamin C (VITAMIN C) 500 MG tablet Take 1 tablet (500 mg total) by mouth daily. (Patient taking differently: Take 500 mg by mouth 2 (two) times daily. ) 30 tablet 0  Objective: BP 106/78 (BP Location: Left Arm, Patient Position: Sitting, Cuff Size: Large)   Pulse 75   Temp 98.2 F (36.8 C) (Oral)   Ht 6' (1.829 m)   Wt 282 lb (127.9 kg)   SpO2 99%   BMI 38.25 kg/m  Gen: NAD, resting comfortably CV: RRR no murmurs rubs or gallops Lungs: CTAB no crackles, wheeze, rhonchi Abdomen: soft/nontender/nondistended/normal bowel sounds.  Ext: trace edema, keeps right leg propped up, right foot with only 3 toes and lateral foot wound still open- mainly whitish tissue noted- slight odor- sees [facility] Skin: warm, dry  Assessment/Plan:  Morbid obesity ([place]), Chronic (BMI over 35 with diabetes, hypertension, hyperlipidemia. - with weight gain- would benefit from weight loss.  Has had success with [facility] in the past- will write letter. Apparently was able to cut down insulin 40%.   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 Has open wound on foot still- sees [facility] tomorrow  Hypertension S: controlled again on ramipril 21m, hctz 12.519m When nurse came out at home and usually 120s or 130s.  BP Readings from Last 3 Encounters:  02/02/17 106/78  12/01/16 122/66  11/26/16 123/86  A/P: at blood pressure goal of <140/90. Continue current meds  Type II diabetes mellitus with neurological manifestations ([place]:  Controlled in past, hopeful remains controlled. On lantus 20-30 units at night (now up to 32 units), humalog 4-10 units each meal (15 units with 2 biggest meals), januvia 5064maily. Had been on metformin in past- switched by prior MD due to "being on for a long time" CBGs- blood sugars trending up with weight going up- had several below 100 but now up to even 140s or 150s in the morning Exercise and diet- needs to improve diet- seems motivated to do this Lab Results  Component Value Date   HGBA1C 5.7 (H) 10/27/2016   HGBA1C 5.3 07/29/2016   HGBA1C 6.4 05/10/2015   A/P: -update a1c today -JamRoselyn Reefll call to get his eye exam from Dr. ShaGershon Cranediscussed pneumovax due to him having diabetes - he opts in  Chronic kidney disease (CKD), stage III (moderate) ([place]: Patient with GFR below 60 since hospitalization- as noted unclear cause (diabetes or due to illness?). He was to see nephrology A/P: discussed lipid, htn control and avoiding nsaids. Update bmet  Dyslipidemia S:  controlled on atorvastatin 60 mg (dose from prior PCP), tricor 48m46mo myalgias.  Lab Results  Component Value Date   CHOL 153 02/01/2016   HDL 38 02/01/2016   LDLCALC 95 02/01/2016   TRIG 90 02/01/2016   CHOLHDL 2.2 01/27/2007   A/P:  update  LDL (not fasting). Also get LFTs with prior alk phos mild elevation  4 months advised  Order associations Type II diabetes mellitus with neurological manifestations (HCC)[place]: CBC, Comprehensive metabolic panel, Hemoglobin A1c, LDL cholesterol, direct Chronic kidney disease (CKD), stage III (moderate) (HCC) - Plan: Comprehensive metabolic panel Dyslipidemia - Plan: CBC, Comprehensive metabolic panel, LDL cholesterol, direct  Return precautions advised.  StepGarret Reddish

## 2017-02-02 NOTE — Assessment & Plan Note (Signed)
S:  controlled on atorvastatin 60 mg (dose from prior PCP), tricor 78m. No myalgias.  Lab Results  Component Value Date   CHOL 153 02/01/2016   HDL 38 02/01/2016   LDLCALC 95 02/01/2016   TRIG 90 02/01/2016   CHOLHDL 2.2 01/27/2007   A/P:  update LDL (not fasting). Also get LFTs with prior alk phos mild elevation

## 2017-02-02 NOTE — Addendum Note (Signed)
Addended by: Mariam Dollar, Roselyn Reef M on: 02/02/2017 11:30 AM   Modules accepted: Orders

## 2017-02-02 NOTE — Assessment & Plan Note (Signed)
S:  Controlled in past, hopeful remains controlled. On lantus 20-30 units at night (now up to 32 units), humalog 4-10 units each meal (15 units with 2 biggest meals), januvia 50mg  daily. Had been on metformin in past- switched by prior MD due to "being on for a long time" CBGs- blood sugars trending up with weight going up- had several below 100 but now up to even 140s or 150s in the morning Exercise and diet- needs to improve diet- seems motivated to do this Lab Results  Component Value Date   HGBA1C 5.7 (H) 10/27/2016   HGBA1C 5.3 07/29/2016   HGBA1C 6.4 05/10/2015   A/P: -update a1c today -Roselyn Reef will call to get his eye exam from Dr. Gershon Crane - discussed pneumovax due to him having diabetes - he opts in

## 2017-02-02 NOTE — Assessment & Plan Note (Signed)
S: Patient with GFR below 60 since hospitalization- as noted unclear cause (diabetes or due to illness?). He was to see nephrology A/P: discussed lipid, htn control and avoiding nsaids. Update bmet

## 2017-02-02 NOTE — Patient Instructions (Addendum)
Pneumovax 23 today  Glad you are back on the improved diet!   Please stop by lab before you go

## 2017-02-03 ENCOUNTER — Ambulatory Visit: Payer: Self-pay | Admitting: Family Medicine

## 2017-02-27 ENCOUNTER — Other Ambulatory Visit: Payer: Self-pay

## 2017-02-27 ENCOUNTER — Encounter (HOSPITAL_BASED_OUTPATIENT_CLINIC_OR_DEPARTMENT_OTHER): Payer: Self-pay

## 2017-02-27 NOTE — Progress Notes (Signed)
Spoke with:  [name] NPO:  No food after midnight/Clear liquids until 7:00AM DOS Arrival time:  11:30AM Labs: Istat8 AM medications: Atorvastatin, Fenofibrate Pre op orders: No Ride home: [name] (wife) [phone]

## 2017-03-06 ENCOUNTER — Ambulatory Visit: Payer: Self-pay | Admitting: Physician Assistant

## 2017-03-10 ENCOUNTER — Ambulatory Visit: Payer: Self-pay | Admitting: Plastic Surgery

## 2017-03-10 DIAGNOSIS — S91301D Unspecified open wound, right foot, subsequent encounter: Secondary | ICD-10-CM

## 2017-03-11 ENCOUNTER — Encounter (HOSPITAL_BASED_OUTPATIENT_CLINIC_OR_DEPARTMENT_OTHER): Payer: Self-pay

## 2017-03-11 ENCOUNTER — Ambulatory Visit (HOSPITAL_BASED_OUTPATIENT_CLINIC_OR_DEPARTMENT_OTHER): Payer: Managed Care, Other (non HMO) | Admitting: Anesthesiology

## 2017-03-11 ENCOUNTER — Encounter (HOSPITAL_BASED_OUTPATIENT_CLINIC_OR_DEPARTMENT_OTHER)
Admission: RE | Disposition: A | Discharge status: Home / Self Care | Payer: Self-pay | Source: Ambulatory Visit | Attending: Plastic Surgery

## 2017-03-11 ENCOUNTER — Ambulatory Visit (HOSPITAL_BASED_OUTPATIENT_CLINIC_OR_DEPARTMENT_OTHER)
Admission: RE | Admit: 2017-03-11 | Discharge: 2017-03-11 | Disposition: A | Discharge status: Home / Self Care | Payer: Managed Care, Other (non HMO) | Source: Ambulatory Visit | Attending: Plastic Surgery | Admitting: Plastic Surgery

## 2017-03-11 DIAGNOSIS — E11621 Type 2 diabetes mellitus with foot ulcer: Secondary | ICD-10-CM | POA: Insufficient documentation

## 2017-03-11 DIAGNOSIS — E1142 Type 2 diabetes mellitus with diabetic polyneuropathy: Secondary | ICD-10-CM | POA: Diagnosis not present

## 2017-03-11 DIAGNOSIS — I129 Hypertensive chronic kidney disease with stage 1 through stage 4 chronic kidney disease, or unspecified chronic kidney disease: Secondary | ICD-10-CM | POA: Insufficient documentation

## 2017-03-11 DIAGNOSIS — Z79899 Other long term (current) drug therapy: Secondary | ICD-10-CM | POA: Diagnosis not present

## 2017-03-11 DIAGNOSIS — Z6836 Body mass index (BMI) 36.0-36.9, adult: Secondary | ICD-10-CM | POA: Diagnosis not present

## 2017-03-11 DIAGNOSIS — L97515 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 without evidence of necrosis: Secondary | ICD-10-CM | POA: Insufficient documentation

## 2017-03-11 DIAGNOSIS — Z7982 Long term (current) use of aspirin: Secondary | ICD-10-CM | POA: Insufficient documentation

## 2017-03-11 DIAGNOSIS — S91301D Unspecified open wound, right foot, subsequent encounter: Secondary | ICD-10-CM

## 2017-03-11 DIAGNOSIS — E1151 Type 2 diabetes mellitus with diabetic peripheral angiopathy without gangrene: Secondary | ICD-10-CM | POA: Insufficient documentation

## 2017-03-11 DIAGNOSIS — E669 Obesity, unspecified: Secondary | ICD-10-CM | POA: Insufficient documentation

## 2017-03-11 DIAGNOSIS — N183 Chronic kidney disease, stage 3 (moderate): Secondary | ICD-10-CM | POA: Diagnosis not present

## 2017-03-11 DIAGNOSIS — Z89421 Acquired absence of other right toe(s): Secondary | ICD-10-CM | POA: Insufficient documentation

## 2017-03-11 DIAGNOSIS — E1122 Type 2 diabetes mellitus with diabetic chronic kidney disease: Secondary | ICD-10-CM | POA: Insufficient documentation

## 2017-03-11 DIAGNOSIS — Z794 Long term (current) use of insulin: Secondary | ICD-10-CM | POA: Diagnosis not present

## 2017-03-11 DIAGNOSIS — E782 Mixed hyperlipidemia: Secondary | ICD-10-CM | POA: Insufficient documentation

## 2017-03-11 HISTORY — DX: Obesity, unspecified: E66.9

## 2017-03-11 HISTORY — DX: Other specified abnormal findings of blood chemistry: R79.89

## 2017-03-11 HISTORY — DX: Deficiency of other specified B group vitamins: E53.8

## 2017-03-11 HISTORY — DX: Abnormal results of liver function studies: R94.5

## 2017-03-11 HISTORY — PX: I & D EXTREMITY: SHX5045

## 2017-03-11 LAB — POCT I-STAT, CHEM 8
BUN: 29 mg/dL — ABNORMAL HIGH (ref 6–20)
Calcium, Ion: 1.25 mmol/L (ref 1.15–1.40)
Chloride: 106 mmol/L (ref 101–111)
Creatinine, Ser: 1.3 mg/dL — ABNORMAL HIGH (ref 0.61–1.24)
Glucose, Bld: 135 mg/dL — ABNORMAL HIGH (ref 65–99)
HCT: 40 % (ref 39.0–52.0)
Hemoglobin: 13.6 g/dL (ref 13.0–17.0)
Potassium: 4.6 mmol/L (ref 3.5–5.1)
Sodium: 143 mmol/L (ref 135–145)
TCO2: 28 mmol/L (ref 22–32)

## 2017-03-11 LAB — GLUCOSE, CAPILLARY: Glucose-Capillary: 133 mg/dL — ABNORMAL HIGH (ref 65–99)

## 2017-03-11 SURGERY — IRRIGATION AND DEBRIDEMENT EXTREMITY
Anesthesia: General | Site: Foot | Laterality: Right

## 2017-03-11 MED ORDER — SODIUM CHLORIDE 0.9 % IV SOLN
250.0000 mL | INTRAVENOUS | Status: DC | PRN
  Filled 2017-03-11: qty 250

## 2017-03-11 MED ORDER — ONDANSETRON HCL 4 MG/2ML IJ SOLN
INTRAMUSCULAR | Status: AC
  Filled 2017-03-11: qty 2

## 2017-03-11 MED ORDER — SODIUM CHLORIDE 0.9% FLUSH
3.0000 mL | Freq: Two times a day (BID) | INTRAVENOUS | Status: DC
  Filled 2017-03-11: qty 3

## 2017-03-11 MED ORDER — CEFAZOLIN SODIUM-DEXTROSE 1-4 GM/50ML-% IV SOLN
INTRAVENOUS | Status: AC
  Filled 2017-03-11: qty 50

## 2017-03-11 MED ORDER — ACETAMINOPHEN 10 MG/ML IV SOLN
1000.0000 mg | Freq: Once | INTRAVENOUS | Status: DC | PRN
  Filled 2017-03-11: qty 100

## 2017-03-11 MED ORDER — OXYCODONE HCL 5 MG PO TABS
5.0000 mg | ORAL_TABLET | ORAL | Status: DC | PRN
  Filled 2017-03-11: qty 2

## 2017-03-11 MED ORDER — SODIUM CHLORIDE 0.9% FLUSH
3.0000 mL | INTRAVENOUS | Status: DC | PRN
  Filled 2017-03-11: qty 3

## 2017-03-11 MED ORDER — DEXTROSE 5 % IV SOLN
3.0000 g | INTRAVENOUS | Status: AC
  Administered 2017-03-11: 3 g via INTRAVENOUS
  Filled 2017-03-11: qty 3000

## 2017-03-11 MED ORDER — HYDROCODONE-ACETAMINOPHEN 5-325 MG PO TABS: 1.0000 | ORAL_TABLET | Freq: Two times a day (BID) | ORAL | 0 refills | Status: DC | PRN

## 2017-03-11 MED ORDER — CEFAZOLIN SODIUM-DEXTROSE 2-4 GM/100ML-% IV SOLN
INTRAVENOUS | Status: AC
  Filled 2017-03-11: qty 100

## 2017-03-11 MED ORDER — FENTANYL CITRATE (PF) 100 MCG/2ML IJ SOLN
INTRAMUSCULAR | Status: AC
  Filled 2017-03-11: qty 2

## 2017-03-11 MED ORDER — MIDAZOLAM HCL 2 MG/2ML IJ SOLN
INTRAMUSCULAR | Status: AC
  Filled 2017-03-11: qty 2

## 2017-03-11 MED ORDER — PROMETHAZINE HCL 25 MG/ML IJ SOLN
6.2500 mg | INTRAMUSCULAR | Status: DC | PRN
  Filled 2017-03-11: qty 1

## 2017-03-11 MED ORDER — HYDROMORPHONE HCL 1 MG/ML IJ SOLN
0.2500 mg | INTRAMUSCULAR | Status: DC | PRN
  Filled 2017-03-11: qty 0.5

## 2017-03-11 MED ORDER — PROPOFOL 10 MG/ML IV BOLUS
INTRAVENOUS | Status: DC | PRN
  Administered 2017-03-11: 200 mg via INTRAVENOUS

## 2017-03-11 MED ORDER — DEXAMETHASONE SODIUM PHOSPHATE 10 MG/ML IJ SOLN
INTRAMUSCULAR | Status: AC
  Filled 2017-03-11: qty 1

## 2017-03-11 MED ORDER — PROPOFOL 10 MG/ML IV BOLUS
INTRAVENOUS | Status: AC
  Filled 2017-03-11: qty 20

## 2017-03-11 MED ORDER — SODIUM CHLORIDE 0.9 % IV SOLN
INTRAVENOUS | Status: DC | PRN
  Administered 2017-03-11: 500 mL

## 2017-03-11 MED ORDER — ONDANSETRON HCL 4 MG/2ML IJ SOLN
INTRAMUSCULAR | Status: DC | PRN
  Administered 2017-03-11: 4 mg via INTRAVENOUS

## 2017-03-11 MED ORDER — HYDROCODONE-ACETAMINOPHEN 7.5-325 MG PO TABS
1.0000 | ORAL_TABLET | Freq: Once | ORAL | Status: DC | PRN
  Filled 2017-03-11: qty 1

## 2017-03-11 MED ORDER — CEFAZOLIN SODIUM-DEXTROSE 2-4 GM/100ML-% IV SOLN
2.0000 g | INTRAVENOUS | Status: DC
  Filled 2017-03-11: qty 100

## 2017-03-11 MED ORDER — SODIUM CHLORIDE 0.9 % IV SOLN
INTRAVENOUS | Status: DC
  Administered 2017-03-11: 13:00:00 via INTRAVENOUS
  Filled 2017-03-11: qty 1000

## 2017-03-11 MED ORDER — MEPERIDINE HCL 25 MG/ML IJ SOLN
6.2500 mg | INTRAMUSCULAR | Status: DC | PRN
  Filled 2017-03-11: qty 1

## 2017-03-11 MED ORDER — LIDOCAINE 2% (20 MG/ML) 5 ML SYRINGE
INTRAMUSCULAR | Status: AC
  Filled 2017-03-11: qty 5

## 2017-03-11 MED ORDER — DEXAMETHASONE SODIUM PHOSPHATE 4 MG/ML IJ SOLN
INTRAMUSCULAR | Status: DC | PRN
  Administered 2017-03-11: 8 mg via INTRAVENOUS

## 2017-03-11 MED ORDER — LIDOCAINE 2% (20 MG/ML) 5 ML SYRINGE
INTRAMUSCULAR | Status: DC | PRN
  Administered 2017-03-11: 100 mg via INTRAVENOUS

## 2017-03-11 MED ORDER — MIDAZOLAM HCL 5 MG/5ML IJ SOLN
INTRAMUSCULAR | Status: DC | PRN
  Administered 2017-03-11: 2 mg via INTRAVENOUS

## 2017-03-11 MED ORDER — FENTANYL CITRATE (PF) 100 MCG/2ML IJ SOLN
INTRAMUSCULAR | Status: DC | PRN
  Administered 2017-03-11: 50 ug via INTRAVENOUS

## 2017-03-11 SURGICAL SUPPLY — 89 items
APL SKNCLS STERI-STRIP NONHPOA (GAUZE/BANDAGES/DRESSINGS)
BAG DECANTER FOR FLEXI CONT (MISCELLANEOUS) IMPLANT
BANDAGE ACE 4X5 VEL STRL LF (GAUZE/BANDAGES/DRESSINGS) ×2 IMPLANT
BANDAGE ELASTIC 3 VELCRO ST LF (GAUZE/BANDAGES/DRESSINGS) IMPLANT
BANDAGE ELASTIC 4 VELCRO ST LF (GAUZE/BANDAGES/DRESSINGS) IMPLANT
BANDAGE ELASTIC 6 VELCRO ST LF (GAUZE/BANDAGES/DRESSINGS) IMPLANT
BENZOIN TINCTURE PRP APPL 2/3 (GAUZE/BANDAGES/DRESSINGS) IMPLANT
BLADE HEX COATED 2.75 (ELECTRODE) ×1 IMPLANT
BLADE MINI RND TIP GREEN BEAV (BLADE) IMPLANT
BLADE SURG 10 STRL SS (BLADE) ×2 IMPLANT
BLADE SURG 15 STRL LF DISP TIS (BLADE) ×1 IMPLANT
BLADE SURG 15 STRL SS (BLADE) ×2
BNDG CMPR 9X4 STRL LF SNTH (GAUZE/BANDAGES/DRESSINGS)
BNDG COHESIVE 1X5 TAN STRL LF (GAUZE/BANDAGES/DRESSINGS) IMPLANT
BNDG COHESIVE 4X5 TAN NS LF (GAUZE/BANDAGES/DRESSINGS) IMPLANT
BNDG ESMARK 4X9 LF (GAUZE/BANDAGES/DRESSINGS) IMPLANT
BNDG GAUZE ELAST 4 BULKY (GAUZE/BANDAGES/DRESSINGS) ×2 IMPLANT
CANISTER SUCT 3000ML PPV (MISCELLANEOUS) IMPLANT
CANISTER SUCTION 1200CC (MISCELLANEOUS) IMPLANT
CELLERATE ACTIVATED COLLAGEN ×2 IMPLANT
CHLORAPREP W/TINT 26ML (MISCELLANEOUS) IMPLANT
CLOTH BEACON ORANGE TIMEOUT ST (SAFETY) ×2 IMPLANT
CORDS BIPOLAR (ELECTRODE) IMPLANT
COVER BACK TABLE 60X90IN (DRAPES) ×2 IMPLANT
COVER MAYO STAND STRL (DRAPES) ×2 IMPLANT
DECANTER SPIKE VIAL GLASS SM (MISCELLANEOUS) IMPLANT
DRAIN PENROSE 18X1/2 LTX STRL (DRAIN) IMPLANT
DRAPE EXTREMITY TIBURON (DRAPES) IMPLANT
DRAPE INCISE IOBAN 66X45 STRL (DRAPES) IMPLANT
DRAPE LG THREE QUARTER DISP (DRAPES) IMPLANT
DRSG EMULSION OIL 3X3 NADH (GAUZE/BANDAGES/DRESSINGS) IMPLANT
ELECT NDL BLADE 2-5/6 (NEEDLE) IMPLANT
ELECT NDL TIP 2.8 STRL (NEEDLE) IMPLANT
ELECT NEEDLE BLADE 2-5/6 (NEEDLE) IMPLANT
ELECT NEEDLE TIP 2.8 STRL (NEEDLE) IMPLANT
ELECT REM PT RETURN 9FT ADLT (ELECTROSURGICAL) ×2
ELECTRODE REM PT RTRN 9FT ADLT (ELECTROSURGICAL) ×1 IMPLANT
GAUZE SPONGE 4X4 12PLY STRL (GAUZE/BANDAGES/DRESSINGS) ×1 IMPLANT
GAUZE XEROFORM 1X8 LF (GAUZE/BANDAGES/DRESSINGS) IMPLANT
GAUZE XEROFORM 5X9 LF (GAUZE/BANDAGES/DRESSINGS) IMPLANT
GLOVE BIO SURGEON STRL SZ 6.5 (GLOVE) ×4 IMPLANT
GLOVE BIOGEL PI IND STRL 8.5 (GLOVE) IMPLANT
GLOVE BIOGEL PI INDICATOR 8.5 (GLOVE) ×1
GLOVE INDICATOR 8.5 STRL (GLOVE) ×1 IMPLANT
GOWN STRL REUS W/ TWL XL LVL3 (GOWN DISPOSABLE) ×1 IMPLANT
GOWN STRL REUS W/TWL XL LVL3 (GOWN DISPOSABLE) ×2
HANDPIECE INTERPULSE COAX TIP (DISPOSABLE)
IV NS IRRIG 3000ML ARTHROMATIC (IV SOLUTION) IMPLANT
KIT RM TURNOVER CYSTO AR (KITS) ×2 IMPLANT
MANIFOLD NEPTUNE II (INSTRUMENTS) IMPLANT
NDL HYPO 30GX1 BEV (NEEDLE) IMPLANT
NEEDLE 27GAX1X1/2 (NEEDLE) IMPLANT
NEEDLE HYPO 30GX1 BEV (NEEDLE) IMPLANT
NS IRRIG 500ML POUR BTL (IV SOLUTION) ×2 IMPLANT
PACK BASIN DAY SURGERY FS (CUSTOM PROCEDURE TRAY) ×2 IMPLANT
PAD ABD 8X10 STRL (GAUZE/BANDAGES/DRESSINGS) ×1 IMPLANT
PADDING CAST ABS 3INX4YD NS (CAST SUPPLIES)
PADDING CAST ABS 4INX4YD NS (CAST SUPPLIES)
PADDING CAST ABS COTTON 3X4 (CAST SUPPLIES) IMPLANT
PADDING CAST ABS COTTON 4X4 ST (CAST SUPPLIES) IMPLANT
PENCIL BUTTON HOLSTER BLD 10FT (ELECTRODE) ×1 IMPLANT
SET HNDPC FAN SPRY TIP SCT (DISPOSABLE) IMPLANT
SLEEVE SCD COMPRESS KNEE MED (MISCELLANEOUS) IMPLANT
SPLINT PLASTER CAST XFAST 3X15 (CAST SUPPLIES) IMPLANT
SPLINT PLASTER XTRA FASTSET 3X (CAST SUPPLIES)
SPONGE LAP 18X18 X RAY DECT (DISPOSABLE) IMPLANT
SPONGE LAP 4X18 X RAY DECT (DISPOSABLE) IMPLANT
STAPLER VISISTAT 35W (STAPLE) IMPLANT
STOCKINETTE 4X48 STRL (DRAPES) IMPLANT
STRIP CLOSURE SKIN 1/2X4 (GAUZE/BANDAGES/DRESSINGS) IMPLANT
SUCTION FRAZIER HANDLE 10FR (MISCELLANEOUS)
SUCTION TUBE FRAZIER 10FR DISP (MISCELLANEOUS) IMPLANT
SURGILUBE 2OZ TUBE FLIPTOP (MISCELLANEOUS) ×2 IMPLANT
SUT ETHILON 3 0 PS 1 (SUTURE) IMPLANT
SUT ETHILON 4 0 P 3 18 (SUTURE) IMPLANT
SUT ETHILON 5 0 PS 2 18 (SUTURE) ×2 IMPLANT
SUT PROLENE 3 0 PS 2 (SUTURE) IMPLANT
SUT SILK 3 0 PS 1 (SUTURE) IMPLANT
SUT VIC AB 3-0 FS2 27 (SUTURE) IMPLANT
SUT VIC AB 5-0 PS2 18 (SUTURE) IMPLANT
SYR BULB IRRIGATION 50ML (SYRINGE) ×2 IMPLANT
SYR CONTROL 10ML LL (SYRINGE) ×2 IMPLANT
TAPE HYPAFIX 6X30 (GAUZE/BANDAGES/DRESSINGS) IMPLANT
TOWEL OR 17X24 6PK STRL BLUE (TOWEL DISPOSABLE) ×2 IMPLANT
TRAY DSU PREP LF (CUSTOM PROCEDURE TRAY) IMPLANT
TUBE CONNECTING 12X1/4 (SUCTIONS) ×2 IMPLANT
UNDERPAD 30X30 INCONTINENT (UNDERPADS AND DIAPERS) ×2 IMPLANT
WATER STERILE IRR 500ML POUR (IV SOLUTION) ×2 IMPLANT
YANKAUER SUCT BULB TIP NO VENT (SUCTIONS) ×2 IMPLANT

## 2017-03-11 NOTE — Anesthesia Postprocedure Evaluation (Signed)
Anesthesia Post Note  Patient: Donya Tomaro [name]  Procedure(s) Performed: IRRIGATION AND DEBRIDEMENT OF RIGHT LATERAL FOOT WOUND WITH CELLERATE COLLAGEN (Right Foot)     Patient location during evaluation: PACU Anesthesia Type: General Level of consciousness: awake and alert Pain management: pain level controlled Vital Signs Assessment: post-procedure vital signs reviewed and stable Respiratory status: spontaneous breathing, nonlabored ventilation and respiratory function stable Cardiovascular status: blood pressure returned to baseline and stable Postop Assessment: no apparent nausea or vomiting Anesthetic complications: no    Last Vitals:  Vitals:   03/11/17 1630 03/11/17 1710  BP: 118/83 122/75  Pulse: 65   Resp: 13 16  Temp:  36.8 C  SpO2: 100% 100%    Last Pain:  Vitals:   03/11/17 1710  TempSrc: Oral                 Lynda Rainwater

## 2017-03-11 NOTE — Discharge Instructions (Signed)
[place] to the area daily starting on Friday and then shower daily starting on Saturday.  Call your surgeon if you experience:   1.  Fever over 101.0. 2.  Inability to urinate. 3.  Nausea and/or vomiting. 4.  Extreme swelling or bruising at the surgical site. 5.  Continued bleeding from the incision. 6.  Increased pain, redness or drainage from the incision. 7.  Problems related to your pain medication. 8.  Any problems and/or concerns   Post Anesthesia Home Care Instructions  Activity: Get plenty of rest for the remainder of the day. A responsible individual must stay with you for 24 hours following the procedure.  For the next 24 hours, DO NOT: -Drive a car -Paediatric nurse -Drink alcoholic beverages -Take any medication unless instructed by your physician -Make any legal decisions or sign important papers.  Meals: Start with liquid foods such as gelatin or soup. Progress to regular foods as tolerated. Avoid greasy, spicy, heavy foods. If nausea and/or vomiting occur, drink only clear liquids until the nausea and/or vomiting subsides. Call your physician if vomiting continues.  Special Instructions/Symptoms: Your throat may feel dry or sore from the anesthesia or the breathing tube placed in your throat during surgery. If this causes discomfort, gargle with warm salt water. The discomfort should disappear within 24 hours.  If you had a scopolamine patch placed behind your ear for the management of post- operative nausea and/or vomiting:  1. The medication in the patch is effective for 72 hours, after which it should be removed.  Wrap patch in a tissue and discard in the trash. Wash hands thoroughly with soap and water. 2. You may remove the patch earlier than 72 hours if you experience unpleasant side effects which may include dry mouth, dizziness or visual disturbances. 3. Avoid touching the patch. Wash your hands with soap and water after contact with the patch.

## 2017-03-11 NOTE — Anesthesia Procedure Notes (Signed)
Procedure Name: LMA Insertion Date/Time: 03/11/2017 3:16 PM Performed by: Lynda Rainwater, MD Pre-anesthesia Checklist: Patient identified, Emergency Drugs available, Suction available and Patient being monitored Patient Re-evaluated:Patient Re-evaluated prior to induction Oxygen Delivery Method: Circle system utilized Preoxygenation: Pre-oxygenation with 100% oxygen Induction Type: IV induction Ventilation: Mask ventilation without difficulty LMA: LMA inserted LMA Size: 5.0 Number of attempts: 1 Airway Equipment and Method: Bite block Placement Confirmation: positive ETCO2 Tube secured with: Tape Dental Injury: Teeth and Oropharynx as per pre-operative assessment

## 2017-03-11 NOTE — Anesthesia Preprocedure Evaluation (Addendum)
Anesthesia Evaluation  Patient identified by MRN, date of birth, ID band Patient awake    Reviewed: Allergy & Precautions, Patient's Chart, lab work & pertinent test results  Airway Mallampati: II  TM Distance: >3 FB Neck ROM: Full    Dental no notable dental hx. (+) Teeth Intact, Dental Advisory Given   Pulmonary neg pulmonary ROS,    breath sounds clear to auscultation       Cardiovascular hypertension, Pt. on medications  Rhythm:Regular Rate:Normal     Neuro/Psych  Neuromuscular disease negative psych ROS   GI/Hepatic negative GI ROS, Neg liver ROS,   Endo/Other  diabetes, Poorly Controlled, Type 2, Oral Hypoglycemic Agents, Insulin DependentMorbid obesity  Renal/GU Renal InsufficiencyRenal disease  negative genitourinary   Musculoskeletal negative musculoskeletal ROS (+)   Abdominal (+) + obese,   Peds  Hematology  (+) anemia ,   Anesthesia Other Findings   Reproductive/Obstetrics                            Anesthesia Physical  Anesthesia Plan  ASA: III  Anesthesia Plan: General   Post-op Pain Management:    Induction: Intravenous  PONV Risk Score and Plan: 3 and Ondansetron, Midazolam and Treatment may vary due to age or medical condition  Airway Management Planned: LMA  Additional Equipment: None  Intra-op Plan:   Post-operative Plan: Extubation in OR  Informed Consent: I have reviewed the patients History and Physical, chart, labs and discussed the procedure including the risks, benefits and alternatives for the proposed anesthesia with the patient or authorized representative who has indicated his/her understanding and acceptance.     Plan Discussed with: CRNA  Anesthesia Plan Comments:        Anesthesia Quick Evaluation

## 2017-03-11 NOTE — Transfer of Care (Signed)
  Last Vitals:  Vitals:   03/11/17 1122 03/11/17 1550  BP: 123/75 108/70  Pulse: 73   Resp: 18 14  Temp: 37 C 36.6 C  SpO2: 100% 100%    Last Pain:  Vitals:   03/11/17 1122  TempSrc: Oral      Patients Stated Pain Goal: 5 (03/11/17 1228)  Immediate Anesthesia Transfer of Care Note  Patient: [name]  Procedure(s) Performed: Procedure(s) (LRB): IRRIGATION AND DEBRIDEMENT OF RIGHT LATERAL FOOT WOUND WITH CELLERATE COLLAGEN (Right)  Patient Location: PACU  Anesthesia Type: General  Level of Consciousness: awake, alert  and oriented  Airway & Oxygen Therapy: Patient Spontanous Breathing and Patient connected to nasal cannula oxygen  Post-op Assessment: Report given to PACU RN and Post -op Vital signs reviewed and stable  Post vital signs: Reviewed and stable  Complications: No apparent anesthesia complications

## 2017-03-11 NOTE — H&P (Signed)
[name] is an 47 y.o. male.   Chief Complaint: right foot wound HPI: The patient is a 47 yrs old bm here with his wife for further treatment of his right foot wound.  He had an injury with infection and partial amputation.  There was difficulty healing.  He underwent debridement with acell and VAC.  He then had a skin graft.  There was take but then breakdown.  He presents for further care.  Past Medical History:  Diagnosis Date  . CKD (chronic kidney disease), stage III [facility])    nephrologist-  dr [name] ([facility] in [place])    . Diabetic foot ulcer with osteomyelitis ([place])    right lateral foot--- s/p  amputation 4th and 5th ray , ACELL , Wound VAC  . Diabetic peripheral neuropathy ([place])   . Elevated LFTs   . Hypertension   . Iron deficiency anemia   . Mixed hyperlipidemia   . Obesity (BMI 30-39.9)   . Type 2 diabetes mellitus treated with insulin ([place])    last A1c 5.3 on 07-29-2016  . Vitamin B 12 deficiency     Past Surgical History:  Procedure Laterality Date  . AMPUTATION Right 07/31/2016   Procedure: AMPUTATION RIGHT FIFTH RAY AND APPLICATION OF WOUND VAC;  Surgeon: Dorna Leitz, MD;  Location: [facility];  Service: Orthopedics;  Laterality: Right;  . AMPUTATION Right 08/08/2016   Procedure: RAY AMPUTATION RIGHT FOURTH METATARSAL, DEBRIDEMENT OF SUBQ TISSUE,BONE AND SKIN WITH WOUND CLOSURE;  Surgeon: Dorna Leitz, MD;  Location: [facility];  Service: Orthopedics;  Laterality: Right;  . ANAL FISSURE REPAIR  2000  . APPLICATION OF A-CELL OF EXTREMITY Right 08/13/2016   Procedure: APPLICATION OF A-CELL AND VAC;  Surgeon: Wallace Going, DO;  [facility]: [facility];  Service: Plastics;  Laterality: Right;  . APPLICATION OF WOUND VAC Right 09/18/2016   Procedure: APPLICATION OF WOUND VAC (PT. WILL BRING HIS OWN);  Surgeon: Wallace Going, DO;  Location: [facility];  Service: Plastics;  Laterality: Right;  . APPLICATION OF WOUND VAC Right  10/30/2016   Procedure: APPLICATION OF WOUND VAC;  Surgeon: Wallace Going, DO;  Location: [place];  Service: Plastics;  Laterality: Right;  . APPLICATION OF WOUND VAC Right 12/01/2016   Procedure: APPLICATION OF WOUND VAC;  Surgeon: Wallace Going, DO;  [facility]: [facility];  Service: Plastics;  Laterality: Right;  . CIRCUMCISION  2006  . I&D EXTREMITY Right 09/18/2016   Procedure: IRRIGATION AND DEBRIDEMENT OF RIGHT LATERAL DIABLTIC FOOT ULCER;  Surgeon: Wallace Going, DO;  Location: [place];  Service: Plastics;  Laterality: Right;  . INCISION AND DRAINAGE OF WOUND Right 08/13/2016   Procedure: IRRIGATION AND DEBRIDEMENT WOUND RIGHT FOOT;  Surgeon: Wallace Going, DO;  [facility];  Service: Plastics;  Laterality: Right;  . LAPAROSCOPIC CHOLECYSTECTOMY  09-09-2006   dr Zella Richer  . LOWER EXTREMITY ANGIOGRAPHY N/A 10/27/2016   Procedure: Lower Extremity Angiography;  Surgeon: Angelia Mould, MD;  Location: [place] [facility];  Service: Cardiovascular;  Laterality: N/A;  . MASS EXCISION Right 10/30/2016   Procedure: EXCISION RIGHT FOOT WOUND WITH VAC PLACEMENT;  Surgeon: Wallace Going, DO;  Location: [place];  Service: Plastics;  Laterality: Right;  . SKIN SPLIT GRAFT Right 12/01/2016   Procedure: SKIN GRAFT SPLIT THICKNESS TO RIGHT LATERAL FOOT WOUND 9 X 3 CM;  Surgeon: Wallace Going, DO;  [facility];  Service: Clinical cytogeneticist;  Laterality: Right;  . wisdom teeth extracted  1984    Family History  Problem Relation Age of Onset  . Stroke Father        early 42s. former smoker  . Diabetes Father   . Hypertension Father   . Sarcoidosis Mother   . Hypertension Sister   . Healthy Daughter   . Diabetes Paternal Grandmother   . Congenital heart disease Paternal Grandmother        died of complications   Social History:  reports that  has never smoked. he has never used smokeless tobacco. He  reports that he does not drink alcohol or use drugs.  Allergies: No Known Allergies  Medications Prior to Admission  Medication Sig Dispense Refill  . aspirin EC 81 MG tablet Take 81 mg by mouth daily.    Marland Kitchen atorvastatin (LIPITOR) 40 MG tablet Take 60 mg by mouth every morning.     . Cyanocobalamin (VITAMIN B-12) 5000 MCG SUBL Place 5,000 mcg under the tongue daily.    . feeding supplement, GLUCERNA SHAKE, (GLUCERNA SHAKE) LIQD Take 237 mLs by mouth 2 (two) times daily between meals. (Patient taking differently: Take 237 mLs by mouth 2 (two) times daily as needed (meal supplement). ) 60 Can 0  . fenofibrate (TRICOR) 48 MG tablet Take 1 tablet (48 mg total) by mouth daily. (Patient taking differently: Take 48 mg by mouth every morning. ) 30 tablet 0  . ferrous sulfate 325 (65 FE) MG tablet Take 1 tablet (325 mg total) by mouth 2 (two) times daily with a meal. 60 tablet 3  . hydrochlorothiazide (MICROZIDE) 12.5 MG capsule Take 12.5 mg by mouth every morning.   3  . insulin glargine (LANTUS) 100 UNIT/ML injection Inject 0.2 mLs (20 Units total) into the skin at bedtime. (Patient taking differently: Inject 20-26 Units into the skin at bedtime. Depends on blood sugar readings) 10 mL 11  . insulin lispro (HUMALOG) 100 UNIT/ML injection Inject 0.04 mLs (4 Units total) into the skin 2 (two) times daily. (Patient taking differently: Inject 4-8 Units into the skin 2 (two) times daily. Per sliding scale) 10 mL 11  . Multiple Vitamin (MULTIVITAMIN WITH MINERALS) TABS tablet Take 1 tablet by mouth 2 (two) times daily.    . Omega-3 Fatty Acids (FISH OIL) 1360 MG CAPS Take 1,360 mg by mouth daily.     Marland Kitchen pyridoxine (B-6) 100 MG tablet Take 100 mg by mouth 2 (two) times daily.    . ramipril (ALTACE) 5 MG capsule Take 5 mg by mouth every morning.     . sitaGLIPtin (JANUVIA) 50 MG tablet Take 1 tablet (50 mg total) by mouth every morning. 30 tablet 5  . vitamin C (VITAMIN C) 500 MG tablet Take 1 tablet (500 mg  total) by mouth daily. (Patient taking differently: Take 500 mg by mouth 2 (two) times daily. ) 30 tablet 0    Results for orders placed or performed during the hospital encounter of 03/11/17 (from the past 48 hour(s))  I-STAT, chem 8     Status: Abnormal   Collection Time: 03/11/17 12:44 PM  Result Value Ref Range   Sodium 143 135 - 145 mmol/L   Potassium 4.6 3.5 - 5.1 mmol/L   Chloride 106 101 - 111 mmol/L   BUN 29 (H) 6 - 20 mg/dL   Creatinine, Ser 1.30 (H) 0.61 - 1.24 mg/dL   Glucose, Bld 135 (H) 65 - 99 mg/dL   Calcium, Ion 1.25  1.15 - 1.40 mmol/L   TCO2 28 22 - 32 mmol/L   Hemoglobin 13.6 13.0 - 17.0 g/dL   HCT 40.0 39.0 - 52.0 %   No results found.  Review of Systems  Constitutional: Negative.   HENT: Negative.   Eyes: Negative.   Respiratory: Negative.   Cardiovascular: Negative.   Gastrointestinal: Negative.   Genitourinary: Negative.   Musculoskeletal: Negative.   Skin: Negative.   Neurological: Negative.   Psychiatric/Behavioral: Negative.     Blood pressure 123/75, pulse 73, temperature 98.6 F (37 C), temperature source Oral, resp. rate 18, height 6' (1.829 m), weight 122.7 kg (270 lb 8 oz), SpO2 100 %. Physical Exam  Constitutional: He is oriented to person, place, and time. He appears well-developed and well-nourished.  HENT:  Head: Normocephalic and atraumatic.  Eyes: Conjunctivae and EOM are normal. Pupils are equal, round, and reactive to light.  Cardiovascular: Normal rate.  Respiratory: Effort normal.  Neurological: He is alert and oriented to person, place, and time.  Skin: Skin is warm.  Psychiatric: He has a normal mood and affect. His behavior is normal. Judgment and thought content normal.     Assessment/Plan Excision and debridement of right foot wound.  Stuart, DO 03/11/2017, 3:07 PM

## 2017-03-11 NOTE — Op Note (Signed)
DATE OF OPERATION: 03/11/2017  LOCATION: [facility]  PREOPERATIVE DIAGNOSIS: right foot wound  POSTOPERATIVE DIAGNOSIS: Same  PROCEDURE: excision of skin, soft tissue and muscle of right foot wound 3 x 6 cm with placement of cellerate 5 gm and gel.  SURGEON: Claire Sanger Dillingham, DO  EBL: 5 cc  CONDITION: Stable  COMPLICATIONS: None  INDICATION: The patient, [name], is a 47 y.o. male born on May 10, 1970, is here for treatment of a chronic diabetic foot wound.  He underwent serial debridement in the past with improvement.  He started to develop thick tissue that was preventing healing.     PROCEDURE DETAILS:  The patient was seen prior to surgery and marked.  The IV antibiotics were given. The patient was taken to the operating room and given a general anesthetic. A standard time out was performed and all information was confirmed by those in the room. SCD was placed on the left leg.   The area was prepped and draped in the usual sterile fashion.  The #10 blade was used to excise the skin, soft tissue of the 3 x 6 cm wound.  At the base was fibrous tissue and a thin layer of muscle that was also excised with the #10 blade.  The foot was irrigated with antibiotic solution.   All of the cellerate and gel was placed on the wound with 4 x 4 gauze and a kerlex dressing.  The leg was wrapped with an ace wrap. The patient was allowed to wake up and taken to recovery room in stable condition at the end of the case. The family was notified at the end of the case.

## 2017-03-12 ENCOUNTER — Encounter (HOSPITAL_BASED_OUTPATIENT_CLINIC_OR_DEPARTMENT_OTHER): Payer: Self-pay | Admitting: Plastic Surgery

## 2017-04-29 ENCOUNTER — Telehealth: Payer: Self-pay | Admitting: Family Medicine

## 2017-04-29 NOTE — Telephone Encounter (Unsigned)
Copied from [place] [id number]. Topic: Quick Communication - See Telephone Encounter >> Apr 29, 2017 10:44 AM Hewitt Shorts wrote: CRM for notification. See Telephone encounter for: 04/29/17. Pt is calling to see why his request for ramiril and fenofibrate was denied  Best number [phone]

## 2017-04-29 NOTE — Telephone Encounter (Signed)
Pt. Requesting Dr. Yong Channel refill his ramipril and fenofibrate. Thanks.

## 2017-04-30 ENCOUNTER — Other Ambulatory Visit: Payer: Self-pay

## 2017-04-30 ENCOUNTER — Telehealth: Payer: Self-pay | Admitting: Family Medicine

## 2017-04-30 MED ORDER — FENOFIBRATE 48 MG PO TABS: 48.0000 mg | ORAL_TABLET | Freq: Every day | ORAL | 1 refills | Status: DC

## 2017-04-30 MED ORDER — RAMIPRIL 5 MG PO CAPS: 5.0000 mg | ORAL_CAPSULE | ORAL | 1 refills | Status: DC

## 2017-04-30 NOTE — Telephone Encounter (Signed)
Prescriptions sent to pharmacy as requested 

## 2017-04-30 NOTE — Telephone Encounter (Signed)
MEDICATION:   ramipril (ALTACE) 5 MG capsule    fenofibrate (TRICOR) 48 MG tablet    PHARMACY:   [facility], [place] [phone] (Phone) [phone] (Fax)       IS THIS A 90 DAY SUPPLY : no 30 day supply  IS PATIENT OUT OF MEDICATION: yes today is the last day  IF NOT; HOW MUCH IS LEFT:   LAST APPOINTMENT DATE:02/02/17  NEXT APPOINTMENT DATE:@5 /14/2019  OTHER COMMENTS: Dr. Yong Channel has not filled this yet. Patient did not need refills for this at their last visit. Their last PCP had filled for one year. Patient stated that they were told by [facility] pharmacy that they could not get in touch with our office. I called and updated the pharmacy information.    **Let patient know to contact pharmacy at the end of the day to make sure medication is ready. **  ** Please notify patient to allow 48-72 hours to process**  **Encourage patient to contact the pharmacy for refills or they can request refills through [facility]**

## 2017-05-19 ENCOUNTER — Telehealth: Payer: Self-pay | Admitting: Family Medicine

## 2017-05-19 NOTE — Telephone Encounter (Signed)
Copied from [place] [phone]. Topic: Quick Communication - Rx Refill/Question >> May 19, 2017  2:05 PM Lennox Solders wrote: Medication: needs new rx  b-d syringe needles 31g x 5/16 0.50ml Has the patient contacted their pharmacy?nos(Agent: If no, request that the patient contact the pharmacy for the refill.) the pharm is calling [facility] (with phone number or street name): Adelfa Koh [phone] Agent: Please be advised that RX refills may take up to 3 business days. We ask that you follow-up with your pharmacy.pt needs 1 box

## 2017-05-20 NOTE — Telephone Encounter (Signed)
Provided a verbal order to the pharmacy. They are giving him a box of 100 with 3 refills. He will use these 4 times a day. They had sent the original prescription request to [place] on 05/14/2017. I provided the pharmacy with the new office contact information

## 2017-05-22 DIAGNOSIS — S91301A Unspecified open wound, right foot, initial encounter: Secondary | ICD-10-CM | POA: Insufficient documentation

## 2017-06-02 ENCOUNTER — Ambulatory Visit (INDEPENDENT_AMBULATORY_CARE_PROVIDER_SITE_OTHER): Payer: BLUE CROSS/BLUE SHIELD | Admitting: Family Medicine

## 2017-06-02 ENCOUNTER — Encounter: Payer: Self-pay | Admitting: Family Medicine

## 2017-06-02 VITALS — BP 132/92 | HR 92 | Temp 98.2°F | Ht 72.0 in | Wt 291.8 lb

## 2017-06-02 DIAGNOSIS — E11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ical complication: Secondary | ICD-10-CM

## 2017-06-02 DIAGNOSIS — E669 Obesity, unspecified: Secondary | ICD-10-CM | POA: Diagnosis not present

## 2017-06-02 DIAGNOSIS — I10 Essential (primary) hypertension: Secondary | ICD-10-CM

## 2017-06-02 DIAGNOSIS — E785 Hyperlipidemia, unspecified: Secondary | ICD-10-CM | POA: Diagnosis not present

## 2017-06-02 DIAGNOSIS — E66812 Obesity, class 2: Secondary | ICD-10-CM | POA: Insufficient documentation

## 2017-06-02 DIAGNOSIS — N183 Chronic kidney disease, stage 3 unspecified: Secondary | ICD-10-CM

## 2017-06-02 LAB — COMPREHENSIVE METABOLIC PANEL
ALT: 45 U/L (ref 0–53)
AST: 31 U/L (ref 0–37)
Albumin: 4 g/dL (ref 3.5–5.2)
Alkaline Phosphatase: 89 U/L (ref 39–117)
BUN: 19 mg/dL (ref 6–23)
CO2: 29 mEq/L (ref 19–32)
Calcium: 9.9 mg/dL (ref 8.4–10.5)
Chloride: 106 mEq/L (ref 96–112)
Creatinine, Ser: 1.6 mg/dL — ABNORMAL HIGH (ref 0.40–1.50)
GFR: 59.85 mL/min — ABNORMAL LOW (ref >60.00)
Glucose, Bld: 73 mg/dL (ref 70–99)
Potassium: 4.3 mEq/L (ref 3.5–5.1)
Sodium: 144 mEq/L (ref 135–145)
Total Bilirubin: 0.7 mg/dL (ref 0.2–1.2)
Total Protein: 8.4 g/dL — ABNORMAL HIGH (ref 6.0–8.3)

## 2017-06-02 LAB — CBC
HCT: 36.4 % — ABNORMAL LOW (ref 39.0–52.0)
Hemoglobin: 11.9 g/dL — ABNORMAL LOW (ref 13.0–17.0)
MCHC: 32.5 g/dL (ref 30.0–36.0)
MCV: 79 fl (ref 78.0–100.0)
Platelets: 361 10*3/uL (ref 150.0–400.0)
RBC: 4.61 Mil/uL (ref 4.22–5.81)
RDW: 14.1 % (ref 11.5–15.5)
WBC: 10.3 10*3/uL (ref 4.0–10.5)

## 2017-06-02 LAB — HEMOGLOBIN A1C: Hgb A1c MFr Bld: 6.2 % (ref 4.6–6.5)

## 2017-06-02 MED ORDER — SITAGLIPTIN PHOSPHATE 50 MG PO TABS: 50.0000 mg | ORAL_TABLET | ORAL | 3 refills | Status: DC

## 2017-06-02 MED ORDER — ATORVASTATIN CALCIUM 40 MG PO TABS: 60.0000 mg | ORAL_TABLET | ORAL | 3 refills | Status: DC

## 2017-06-02 MED ORDER — HYDROCHLOROTHIAZIDE 12.5 MG PO CAPS: 12.5000 mg | ORAL_CAPSULE | ORAL | 3 refills | Status: DC

## 2017-06-02 MED ORDER — INSULIN LISPRO 100 UNIT/ML ~~LOC~~ SOLN: 5.0000 [IU] | Freq: Two times a day (BID) | SUBCUTANEOUS | 3 refills | Status: DC

## 2017-06-02 MED ORDER — FENOFIBRATE 48 MG PO TABS: 48.0000 mg | ORAL_TABLET | Freq: Every day | ORAL | 3 refills | Status: DC

## 2017-06-02 MED ORDER — GLUCERNA SHAKE PO LIQD: 237.0000 mL | Freq: Two times a day (BID) | ORAL | 3 refills | Status: DC

## 2017-06-02 MED ORDER — INSULIN GLARGINE 100 UNIT/ML ~~LOC~~ SOLN: 20.0000 [IU] | Freq: Every day | SUBCUTANEOUS | 3 refills | Status: DC

## 2017-06-02 MED ORDER — RAMIPRIL 5 MG PO CAPS: 5.0000 mg | ORAL_CAPSULE | ORAL | 3 refills | Status: DC

## 2017-06-02 NOTE — Assessment & Plan Note (Signed)
S:  controlled on Lantus 32 units--> he went down to 30 units and Humalog 15-20 units with largest meals x2, Januvia 50 mg.  Metformin stopped by prior provider due to "being on for a long time per patient"-could potentially restart CBGs- most fasting sugars between 80-120. Has had a few lows in the 60s- promptly treats and gets back up- some lows in AM if eats very healthy night before.  Exercise and diet- walking a lot at work Lab Results  Component Value Date   HGBA1C 6.3 02/02/2017   HGBA1C 5.7 (H) 10/27/2016   HGBA1C 5.3 07/29/2016   A/P: update a1c with labs today. Discussed dropping to 10 units with healthier meals- really want to avoid lows. Has been harder to eat well with new work schedule

## 2017-06-02 NOTE — Assessment & Plan Note (Signed)
S: Patient's last GFR was actually above 60.  Prior decrease could have been related to hospitalization.  He was to see nephrology- has had 2 visits  A/P:  We will monitor bmet and he will get back into nephrology when thing ssettle down with foot

## 2017-06-02 NOTE — Progress Notes (Signed)
Subjective:  [name] is a 47 y.o. year old very pleasant male patient who presents for/with See problem oriented charting ROS- has had a few lows. No chest pain or shortness of breath. No headache or blurry vision.    Past Medical History-  Patient Active Problem List   Diagnosis Date Noted  . Diabetic foot ulcer with osteomyelitis ([place]) 07/29/2016    Priority: High  . Morbid obesity ([place]) 03/08/2015    Priority: High  . Type II diabetes mellitus with neurological manifestations ([place]) 03/07/2015    Priority: High  . Chronic kidney disease (CKD), stage III (moderate) ([place]) 11/17/2016    Priority: Medium  . Diabetic neuropathy ([place]) 11/17/2016    Priority: Medium  . Elevated LFTs     Priority: Medium  . Dyslipidemia 03/08/2015    Priority: Medium  . HTN (hypertension) 03/08/2015    Priority: Medium  . Obesity, Class II, BMI 35-39.9 06/02/2017    Priority: Low  . B12 deficiency 11/17/2016    Priority: Low  . Iron deficiency anemia 11/17/2016    Priority: Low  . 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 aspirin EC 81 MG tablet Take 81 mg by mouth daily.    Marland Kitchen atorvastatin (LIPITOR) 40 MG tablet Take 1.5 tablets (60 mg total) by mouth every morning. 135 tablet 3  . Cyanocobalamin (VITAMIN B-12) 5000 MCG SUBL Place 5,000 mcg under the tongue daily.    . feeding supplement, GLUCERNA SHAKE, (GLUCERNA SHAKE) LIQD Take 237 mLs by mouth 2 (two) times daily between meals. 180 Can 3  . fenofibrate (TRICOR) 48 MG tablet Take 1 tablet (48 mg total) by mouth daily. 90 tablet 3  . ferrous sulfate 325 (65 FE) MG tablet Take 1 tablet (325 mg total) by mouth 2 (two) times daily with a meal. 60 tablet 3  . hydrochlorothiazide  (MICROZIDE) 12.5 MG capsule Take 1 capsule (12.5 mg total) by mouth every morning. 90 capsule 3  . insulin glargine (LANTUS) 100 UNIT/ML injection Inject 0.2-0.35 mLs (20-35 Units total) into the skin at bedtime. 30 mL 3  . insulin lispro (HUMALOG) 100 UNIT/ML injection Inject 0.05-0.2 mLs (5-20 Units total) into the skin 2 (two) times daily. 40 mL 3  . Multiple Vitamin (MULTIVITAMIN WITH MINERALS) TABS tablet Take 1 tablet by mouth 2 (two) times daily.    . Omega-3 Fatty Acids (FISH OIL) 1360 MG CAPS Take 1,360 mg by mouth daily.     Marland Kitchen pyridoxine (B-6) 100 MG tablet Take 100 mg by mouth 2 (two) times daily.    . ramipril (ALTACE) 5 MG capsule Take 1 capsule (5 mg total) by mouth every morning. 90 capsule 3  . sitaGLIPtin (JANUVIA) 50 MG tablet Take 1 tablet (50 mg total) by mouth every morning. 90 tablet 3   No current facility-administered medications for this visit.     Objective: BP (!) 132/92 (BP Location: Left Arm, Patient Position: Sitting, Cuff Size: Normal)   Pulse 92   Temp 98.2 F (36.8 C) (Oral)   Ht 6' (1.829 m)   Wt 291 lb 12.8 oz (132.4 kg)   SpO2 98%   BMI 39.58 kg/m  Gen: NAD, resting comfortably CV: RRR no murmurs rubs  or gallops Lungs: CTAB no crackles, wheeze, rhonchi Abdomen: soft/nontender/obese Ext: no edema Skin: warm, dry Neuro: reduced monofilament sensation as below Diabetic Foot Exam - Simple   Simple Foot Form Diabetic Foot exam was performed with the following findings:  Yes 06/02/2017 11:02 AM  Visual Inspection See comments:  Yes Sensation Testing See comments:  Yes Pulse Check Posterior Tibialis and Dorsalis pulse intact bilaterally:  Yes Comments Amputation of 4th and 5th ray on right foot- open wound in that area. Some odor. Following closely with [facility] and has upcoming surgery.  Decreased monofilament test until mid shin both legs    Assessment/Plan:  Other notes: 1.  Diabetic foot ulcer-he had another surgery in February for his  right foot.  He has another surgery planned in June for excision of the wound with cellerate placement at [facility]  Type II diabetes mellitus with neurological manifestations ([place]) S:  controlled on Lantus 32 units--> he went down to 30 units and Humalog 15-20 units with largest meals x2, Januvia 50 mg.  Metformin stopped by prior provider due to "being on for a long time per patient"-could potentially restart CBGs- most fasting sugars between 80-120. Has had a few lows in the 60s- promptly treats and gets back up- some lows in AM if eats very healthy night before.  Exercise and diet- walking a lot at work Lab Results  Component Value Date   HGBA1C 6.3 02/02/2017   HGBA1C 5.7 (H) 10/27/2016   HGBA1C 5.3 07/29/2016   A/P: update a1c with labs today. Discussed dropping to 10 units with healthier meals- really want to avoid lows. Has been harder to eat well with new work schedule     Chronic kidney disease (CKD), stage III (moderate) ([place]) S: Patient's last GFR was actually above 60.  Prior decrease could have been related to hospitalization.  He was to see nephrology- has had 2 visits  A/P:  We will monitor bmet and he will get back into nephrology when thing ssettle down with foot  Dyslipidemia S:  controlled on atorvastatin 60 mg per prior PCP last LDL was 61 which is ideal.  He is also on TriCor 48 mg Lab Results  Component Value Date   CHOL 153 02/01/2016   HDL 38 02/01/2016   LDLCALC 95 02/01/2016   LDLDIRECT 61.0 02/02/2017   TRIG 90 02/01/2016   CHOLHDL 2.2 01/27/2007   A/P: Continue current medications.  We will monitor CMET  HTN (hypertension) S: controlled on Ramipril 5mg , hctz 12.5 mg BP Readings from Last 3 Encounters:  06/02/17 (!) 132/92  03/11/17 122/75  02/02/17 106/78  A/P: We discussed blood pressure goal of <140/90. Continue current meds    Obesity, Class II, BMI 35-39.9 S: Unfortunately patient is up another 9 pounds since last visit.  Had tried to refer  him to [facility] which helped him in the past. Some of this weight is from wearing steel toed shoes. He is still considering [facility] Wt Readings from Last 3 Encounters:  06/02/17 291 lb 12.8 oz (132.4 kg)  03/11/17 270 lb 8 oz (122.7 kg)  02/02/17 282 lb (127.9 kg)  A/P: Encouraged healthy eating and weight loss.  Exercising to be difficult with him with his foot and work schedule.    Return in about 4 months (around 10/03/2017) for follow up- or sooner if needed.  Lab/Order associations: Type II diabetes mellitus with neurological manifestations ([place]) - Plan: CBC, Comprehensive metabolic panel, Hemoglobin A1c  Meds ordered  this encounter  Medications  . insulin lispro (HUMALOG) 100 UNIT/ML injection    Sig: Inject 0.05-0.2 mLs (5-20 Units total) into the skin 2 (two) times daily.    Dispense:  40 mL    Refill:  3  . insulin glargine (LANTUS) 100 UNIT/ML injection    Sig: Inject 0.2-0.35 mLs (20-35 Units total) into the skin at bedtime.    Dispense:  30 mL    Refill:  3  . feeding supplement, GLUCERNA SHAKE, (GLUCERNA SHAKE) LIQD    Sig: Take 237 mLs by mouth 2 (two) times daily between meals.    Dispense:  180 Can    Refill:  3  . ramipril (ALTACE) 5 MG capsule    Sig: Take 1 capsule (5 mg total) by mouth every morning.    Dispense:  90 capsule    Refill:  3  . sitaGLIPtin (JANUVIA) 50 MG tablet    Sig: Take 1 tablet (50 mg total) by mouth every morning.    Dispense:  90 tablet    Refill:  3  . fenofibrate (TRICOR) 48 MG tablet    Sig: Take 1 tablet (48 mg total) by mouth daily.    Dispense:  90 tablet    Refill:  3  . hydrochlorothiazide (MICROZIDE) 12.5 MG capsule    Sig: Take 1 capsule (12.5 mg total) by mouth every morning.    Dispense:  90 capsule    Refill:  3  . atorvastatin (LIPITOR) 40 MG tablet    Sig: Take 1.5 tablets (60 mg total) by mouth every morning.    Dispense:  135 tablet    Refill:  3    Return precautions advised.  [name], MD

## 2017-06-02 NOTE — Assessment & Plan Note (Signed)
S: Unfortunately patient is up another 9 pounds since last visit.  Had tried to refer him to [facility] which helped him in the past. Some of this weight is from wearing steel toed shoes. He is still considering [facility] Wt Readings from Last 3 Encounters:  06/02/17 291 lb 12.8 oz (132.4 kg)  03/11/17 270 lb 8 oz (122.7 kg)  02/02/17 282 lb (127.9 kg)  A/P: Encouraged healthy eating and weight loss.  Exercising to be difficult with him with his foot and work schedule.

## 2017-06-02 NOTE — Assessment & Plan Note (Signed)
S:  controlled on atorvastatin 60 mg per prior PCP last LDL was 61 which is ideal.  He is also on TriCor 48 mg Lab Results  Component Value Date   CHOL 153 02/01/2016   HDL 38 02/01/2016   LDLCALC 95 02/01/2016   LDLDIRECT 61.0 02/02/2017   TRIG 90 02/01/2016   CHOLHDL 2.2 01/27/2007   A/P: Continue current medications.  We will monitor CMET

## 2017-06-02 NOTE — Assessment & Plan Note (Signed)
S: controlled on Ramipril 5mg , hctz 12.5 mg BP Readings from Last 3 Encounters:  06/02/17 (!) 132/92  03/11/17 122/75  02/02/17 106/78  A/P: We discussed blood pressure goal of <140/90. Continue current meds

## 2017-06-02 NOTE — Progress Notes (Signed)
Your CBC was largely normal (blood counts, infection fighting cells, platelets) other than slight anemia which is stable from last check here. Your CMET was largely normal (kidney, liver, and electrolytes, blood sugar).  You do have slight worsening of your kidney functions but it is overall stable when compared to about 7 months ago-without significant worsening we can just continue to follow at each visit.   Your hemoglobin A1c continues to look great at 6.2.  Let us try to focus on avoiding low sugars as we discussed.

## 2017-06-02 NOTE — Patient Instructions (Addendum)
Health Maintenance Due  Topic Date Due  . OPHTHALMOLOGY EXAM - Patient has scheduled it for Monday May 20th, 2019 . Thanks for doing this!  04/12/1980  . FOOT EXAM - Today at office visit 02/07/2017   Our team can order your needles for your insulin- let them know what you need. I refilled all your meds for 90 days x 3 refills (good for about a year)  If you have not had a physical within the last year-you could schedule your next visit as a physical

## 2017-06-08 LAB — HM DIABETES EYE EXAM

## 2017-06-22 ENCOUNTER — Encounter: Payer: Self-pay | Admitting: Family Medicine

## 2017-06-30 ENCOUNTER — Ambulatory Visit: Payer: Self-pay | Admitting: Plastic Surgery

## 2017-06-30 DIAGNOSIS — S91301D Unspecified open wound, right foot, subsequent encounter: Secondary | ICD-10-CM

## 2017-07-09 ENCOUNTER — Encounter (HOSPITAL_BASED_OUTPATIENT_CLINIC_OR_DEPARTMENT_OTHER): Payer: Self-pay

## 2017-07-10 ENCOUNTER — Other Ambulatory Visit: Payer: Self-pay

## 2017-07-10 ENCOUNTER — Ambulatory Visit: Payer: Self-pay | Admitting: Physician Assistant

## 2017-07-10 ENCOUNTER — Encounter (HOSPITAL_BASED_OUTPATIENT_CLINIC_OR_DEPARTMENT_OTHER): Payer: Self-pay | Admitting: *Deleted

## 2017-07-10 NOTE — Progress Notes (Signed)
Spoke w/ pt via phone for pre-op interview.  Npo after mn.  Arrive at 0830.  Need istat 8.  Current ekg in chart and epic.  Will take lipitor and tricor am dos w/ sips of water.

## 2017-07-15 ENCOUNTER — Ambulatory Visit: Payer: Self-pay | Admitting: Plastic Surgery

## 2017-07-15 MED ORDER — DEXTROSE 5 % IV SOLN
3.0000 g | INTRAVENOUS | Status: AC
  Administered 2017-07-16: 3 g via INTRAVENOUS
  Filled 2017-07-15 (×2): qty 3000

## 2017-07-16 ENCOUNTER — Ambulatory Visit (HOSPITAL_BASED_OUTPATIENT_CLINIC_OR_DEPARTMENT_OTHER): Payer: BLUE CROSS/BLUE SHIELD | Admitting: Anesthesiology

## 2017-07-16 ENCOUNTER — Encounter (HOSPITAL_BASED_OUTPATIENT_CLINIC_OR_DEPARTMENT_OTHER)
Admission: RE | Disposition: A | Discharge status: Home / Self Care | Payer: Self-pay | Source: Ambulatory Visit | Attending: Plastic Surgery

## 2017-07-16 ENCOUNTER — Ambulatory Visit (HOSPITAL_BASED_OUTPATIENT_CLINIC_OR_DEPARTMENT_OTHER)
Admission: RE | Admit: 2017-07-16 | Discharge: 2017-07-16 | Disposition: A | Discharge status: Home / Self Care | Payer: BLUE CROSS/BLUE SHIELD | Source: Ambulatory Visit | Attending: Plastic Surgery | Admitting: Plastic Surgery

## 2017-07-16 ENCOUNTER — Encounter (HOSPITAL_BASED_OUTPATIENT_CLINIC_OR_DEPARTMENT_OTHER): Payer: Self-pay

## 2017-07-16 DIAGNOSIS — Z89421 Acquired absence of other right toe(s): Secondary | ICD-10-CM | POA: Diagnosis not present

## 2017-07-16 DIAGNOSIS — E782 Mixed hyperlipidemia: Secondary | ICD-10-CM | POA: Insufficient documentation

## 2017-07-16 DIAGNOSIS — E669 Obesity, unspecified: Secondary | ICD-10-CM | POA: Diagnosis not present

## 2017-07-16 DIAGNOSIS — E11621 Type 2 diabetes mellitus with foot ulcer: Secondary | ICD-10-CM | POA: Diagnosis present

## 2017-07-16 DIAGNOSIS — L97519 Non-pressure chronic ulcer of other part of right foot with unspecified severity: Secondary | ICD-10-CM | POA: Insufficient documentation

## 2017-07-16 DIAGNOSIS — M869 Osteomyelitis, unspecified: Secondary | ICD-10-CM | POA: Diagnosis not present

## 2017-07-16 DIAGNOSIS — E1142 Type 2 diabetes mellitus with diabetic polyneuropathy: Secondary | ICD-10-CM | POA: Insufficient documentation

## 2017-07-16 DIAGNOSIS — Z7984 Long term (current) use of oral hypoglycemic drugs: Secondary | ICD-10-CM | POA: Insufficient documentation

## 2017-07-16 DIAGNOSIS — Z7982 Long term (current) use of aspirin: Secondary | ICD-10-CM | POA: Insufficient documentation

## 2017-07-16 DIAGNOSIS — S91301D Unspecified open wound, right foot, subsequent encounter: Secondary | ICD-10-CM

## 2017-07-16 DIAGNOSIS — E1169 Type 2 diabetes mellitus with other specified complication: Secondary | ICD-10-CM | POA: Insufficient documentation

## 2017-07-16 DIAGNOSIS — E538 Deficiency of other specified B group vitamins: Secondary | ICD-10-CM | POA: Diagnosis not present

## 2017-07-16 DIAGNOSIS — Z6838 Body mass index (BMI) 38.0-38.9, adult: Secondary | ICD-10-CM | POA: Diagnosis not present

## 2017-07-16 DIAGNOSIS — N183 Chronic kidney disease, stage 3 (moderate): Secondary | ICD-10-CM | POA: Diagnosis not present

## 2017-07-16 DIAGNOSIS — Z79899 Other long term (current) drug therapy: Secondary | ICD-10-CM | POA: Insufficient documentation

## 2017-07-16 DIAGNOSIS — I129 Hypertensive chronic kidney disease with stage 1 through stage 4 chronic kidney disease, or unspecified chronic kidney disease: Secondary | ICD-10-CM | POA: Diagnosis not present

## 2017-07-16 DIAGNOSIS — E1122 Type 2 diabetes mellitus with diabetic chronic kidney disease: Secondary | ICD-10-CM | POA: Insufficient documentation

## 2017-07-16 DIAGNOSIS — E1151 Type 2 diabetes mellitus with diabetic peripheral angiopathy without gangrene: Secondary | ICD-10-CM | POA: Insufficient documentation

## 2017-07-16 HISTORY — DX: Benign neoplasm of pituitary gland: D35.2

## 2017-07-16 HISTORY — PX: APPLICATION OF A-CELL OF EXTREMITY: SHX6303

## 2017-07-16 HISTORY — DX: Unspecified atherosclerosis of native arteries of extremities, unspecified extremity: I70.209

## 2017-07-16 HISTORY — PX: DEBRIDEMENT  FOOT: SUR387

## 2017-07-16 HISTORY — PX: MASS EXCISION: SHX2000

## 2017-07-16 HISTORY — DX: Unspecified atherosclerosis: I70.90

## 2017-07-16 LAB — POCT I-STAT, CHEM 8
BUN: 23 mg/dL — ABNORMAL HIGH (ref 6–20)
Calcium, Ion: 1.27 mmol/L (ref 1.15–1.40)
Chloride: 102 mmol/L (ref 98–111)
Creatinine, Ser: 1.5 mg/dL — ABNORMAL HIGH (ref 0.61–1.24)
Glucose, Bld: 121 mg/dL — ABNORMAL HIGH (ref 70–99)
HCT: 40 % (ref 39.0–52.0)
Hemoglobin: 13.6 g/dL (ref 13.0–17.0)
Potassium: 4.4 mmol/L (ref 3.5–5.1)
Sodium: 142 mmol/L (ref 135–145)
TCO2: 26 mmol/L (ref 22–32)

## 2017-07-16 LAB — GLUCOSE, CAPILLARY: Glucose-Capillary: 124 mg/dL — ABNORMAL HIGH (ref 70–99)

## 2017-07-16 SURGERY — EXCISION MASS
Anesthesia: General | Laterality: Right

## 2017-07-16 MED ORDER — SODIUM CHLORIDE 0.9% FLUSH
3.0000 mL | INTRAVENOUS | Status: DC | PRN
  Filled 2017-07-16: qty 3

## 2017-07-16 MED ORDER — OXYCODONE HCL 5 MG PO TABS
5.0000 mg | ORAL_TABLET | ORAL | Status: DC | PRN
  Filled 2017-07-16: qty 2

## 2017-07-16 MED ORDER — PROMETHAZINE HCL 25 MG/ML IJ SOLN
6.2500 mg | INTRAMUSCULAR | Status: DC | PRN
  Filled 2017-07-16: qty 1

## 2017-07-16 MED ORDER — DEXAMETHASONE SODIUM PHOSPHATE 4 MG/ML IJ SOLN
INTRAMUSCULAR | Status: DC | PRN
  Administered 2017-07-16: 5 mg via INTRAVENOUS

## 2017-07-16 MED ORDER — SODIUM CHLORIDE 0.9% FLUSH
3.0000 mL | Freq: Two times a day (BID) | INTRAVENOUS | Status: DC
  Filled 2017-07-16: qty 3

## 2017-07-16 MED ORDER — PHENYLEPHRINE 40 MCG/ML (10ML) SYRINGE FOR IV PUSH (FOR BLOOD PRESSURE SUPPORT)
PREFILLED_SYRINGE | INTRAVENOUS | Status: AC
  Filled 2017-07-16: qty 10

## 2017-07-16 MED ORDER — SODIUM CHLORIDE 0.9 % IR SOLN
Status: DC | PRN
  Administered 2017-07-16: 1000 mL

## 2017-07-16 MED ORDER — PROPOFOL 10 MG/ML IV BOLUS
INTRAVENOUS | Status: AC
  Filled 2017-07-16: qty 20

## 2017-07-16 MED ORDER — OXYCODONE HCL 5 MG PO TABS
5.0000 mg | ORAL_TABLET | Freq: Once | ORAL | Status: DC | PRN
  Filled 2017-07-16: qty 1

## 2017-07-16 MED ORDER — HYDROMORPHONE HCL 1 MG/ML IJ SOLN
0.2500 mg | INTRAMUSCULAR | Status: DC | PRN
  Filled 2017-07-16: qty 0.5

## 2017-07-16 MED ORDER — LIDOCAINE 2% (20 MG/ML) 5 ML SYRINGE
INTRAMUSCULAR | Status: AC
  Filled 2017-07-16: qty 5

## 2017-07-16 MED ORDER — ACETAMINOPHEN 325 MG PO TABS
650.0000 mg | ORAL_TABLET | ORAL | Status: DC | PRN
  Filled 2017-07-16: qty 2

## 2017-07-16 MED ORDER — POLYMYXIN B SULFATE 500000 UNITS IJ SOLR
INTRAMUSCULAR | Status: DC | PRN
  Administered 2017-07-16 (×2): 500 mL

## 2017-07-16 MED ORDER — SODIUM CHLORIDE 0.9 % IV SOLN
250.0000 mL | INTRAVENOUS | Status: DC | PRN
  Filled 2017-07-16: qty 250

## 2017-07-16 MED ORDER — DEXAMETHASONE SODIUM PHOSPHATE 10 MG/ML IJ SOLN
INTRAMUSCULAR | Status: AC
  Filled 2017-07-16: qty 1

## 2017-07-16 MED ORDER — MIDAZOLAM HCL 5 MG/5ML IJ SOLN
INTRAMUSCULAR | Status: DC | PRN
  Administered 2017-07-16 (×2): 1 mg via INTRAVENOUS

## 2017-07-16 MED ORDER — FENTANYL CITRATE (PF) 100 MCG/2ML IJ SOLN
INTRAMUSCULAR | Status: AC
  Filled 2017-07-16: qty 2

## 2017-07-16 MED ORDER — FENTANYL CITRATE (PF) 100 MCG/2ML IJ SOLN
INTRAMUSCULAR | Status: DC | PRN
  Administered 2017-07-16: 25 ug via INTRAVENOUS

## 2017-07-16 MED ORDER — HYDROCODONE-ACETAMINOPHEN 5-325 MG PO TABS: 1.0000 | ORAL_TABLET | Freq: Four times a day (QID) | ORAL | 0 refills | Status: AC | PRN

## 2017-07-16 MED ORDER — ONDANSETRON HCL 4 MG/2ML IJ SOLN
INTRAMUSCULAR | Status: AC
  Filled 2017-07-16: qty 2

## 2017-07-16 MED ORDER — LIDOCAINE 2% (20 MG/ML) 5 ML SYRINGE
INTRAMUSCULAR | Status: DC | PRN
  Administered 2017-07-16: 60 mg via INTRAVENOUS
  Administered 2017-07-16: 40 mg via INTRAVENOUS

## 2017-07-16 MED ORDER — MIDAZOLAM HCL 2 MG/2ML IJ SOLN
INTRAMUSCULAR | Status: AC
  Filled 2017-07-16: qty 2

## 2017-07-16 MED ORDER — PROPOFOL 10 MG/ML IV BOLUS
INTRAVENOUS | Status: DC | PRN
  Administered 2017-07-16: 200 mg via INTRAVENOUS
  Administered 2017-07-16: 100 mg via INTRAVENOUS

## 2017-07-16 MED ORDER — CEFAZOLIN SODIUM-DEXTROSE 1-4 GM/50ML-% IV SOLN
INTRAVENOUS | Status: AC
  Filled 2017-07-16: qty 50

## 2017-07-16 MED ORDER — ACETAMINOPHEN 650 MG RE SUPP
650.0000 mg | RECTAL | Status: DC | PRN
  Filled 2017-07-16: qty 1

## 2017-07-16 MED ORDER — CEFAZOLIN SODIUM-DEXTROSE 2-4 GM/100ML-% IV SOLN
INTRAVENOUS | Status: AC
  Filled 2017-07-16: qty 100

## 2017-07-16 MED ORDER — SODIUM CHLORIDE 0.9 % IV SOLN
INTRAVENOUS | Status: DC
  Administered 2017-07-16: 10:00:00 via INTRAVENOUS
  Filled 2017-07-16: qty 1000

## 2017-07-16 MED ORDER — OXYCODONE HCL 5 MG/5ML PO SOLN
5.0000 mg | Freq: Once | ORAL | Status: DC | PRN
  Filled 2017-07-16: qty 5

## 2017-07-16 MED ORDER — ONDANSETRON HCL 4 MG/2ML IJ SOLN
INTRAMUSCULAR | Status: DC | PRN
  Administered 2017-07-16: 4 mg via INTRAVENOUS

## 2017-07-16 SURGICAL SUPPLY — 96 items
AGENT HMST GEL NTOXPRSV FR 28 (Miscellaneous) ×1 IMPLANT
AGENT HMST PWDR HDRLZ BVN CLGN (Miscellaneous) ×1 IMPLANT
APL SKNCLS STERI-STRIP NONHPOA (GAUZE/BANDAGES/DRESSINGS)
BAG DECANTER FOR FLEXI CONT (MISCELLANEOUS) IMPLANT
BANDAGE ACE 4X5 VEL STRL LF (GAUZE/BANDAGES/DRESSINGS) ×2 IMPLANT
BANDAGE ELASTIC 3 VELCRO ST LF (GAUZE/BANDAGES/DRESSINGS) IMPLANT
BANDAGE ELASTIC 4 VELCRO ST LF (GAUZE/BANDAGES/DRESSINGS) IMPLANT
BANDAGE ELASTIC 6 VELCRO ST LF (GAUZE/BANDAGES/DRESSINGS) IMPLANT
BENZOIN TINCTURE PRP APPL 2/3 (GAUZE/BANDAGES/DRESSINGS) IMPLANT
BLADE MINI RND TIP GREEN BEAV (BLADE) IMPLANT
BLADE SURG 10 STRL SS (BLADE) IMPLANT
BLADE SURG 15 STRL LF DISP TIS (BLADE) ×1 IMPLANT
BLADE SURG 15 STRL SS (BLADE) ×3
BNDG CMPR 9X4 STRL LF SNTH (GAUZE/BANDAGES/DRESSINGS)
BNDG COHESIVE 1X5 TAN STRL LF (GAUZE/BANDAGES/DRESSINGS) IMPLANT
BNDG COHESIVE 4X5 TAN NS LF (GAUZE/BANDAGES/DRESSINGS) IMPLANT
BNDG ESMARK 4X9 LF (GAUZE/BANDAGES/DRESSINGS) IMPLANT
BNDG GAUZE ELAST 4 BULKY (GAUZE/BANDAGES/DRESSINGS) ×3 IMPLANT
CANISTER SUCT 3000ML PPV (MISCELLANEOUS) IMPLANT
CANISTER SUCTION 1200CC (MISCELLANEOUS) IMPLANT
CHLORAPREP W/TINT 26ML (MISCELLANEOUS) IMPLANT
CLOSURE WOUND 1/2 X4 (GAUZE/BANDAGES/DRESSINGS)
CLOTH BEACON ORANGE TIMEOUT ST (SAFETY) ×3 IMPLANT
COLLAGEN CELLERATERX 1 GRAM (Miscellaneous) ×2 IMPLANT
CORDS BIPOLAR (ELECTRODE) IMPLANT
COVER BACK TABLE 60X90IN (DRAPES) ×3 IMPLANT
COVER MAYO STAND STRL (DRAPES) ×3 IMPLANT
DECANTER SPIKE VIAL GLASS SM (MISCELLANEOUS) IMPLANT
DRAIN PENROSE 18X1/2 LTX STRL (DRAIN) ×3 IMPLANT
DRAPE EXTREMITY T 121X128X90 (DRAPE) IMPLANT
DRAPE INCISE IOBAN 66X45 STRL (DRAPES) IMPLANT
DRAPE LG THREE QUARTER DISP (DRAPES) IMPLANT
DRSG EMULSION OIL 3X3 NADH (GAUZE/BANDAGES/DRESSINGS) IMPLANT
ELECT NDL BLADE 2-5/6 (NEEDLE) IMPLANT
ELECT NDL TIP 2.8 STRL (NEEDLE) IMPLANT
ELECT NEEDLE BLADE 2-5/6 (NEEDLE) IMPLANT
ELECT NEEDLE TIP 2.8 STRL (NEEDLE) IMPLANT
ELECT REM PT RETURN 9FT ADLT (ELECTROSURGICAL) ×3
ELECTRODE REM PT RTRN 9FT ADLT (ELECTROSURGICAL) ×1 IMPLANT
GAUZE SPONGE 4X4 12PLY STRL (GAUZE/BANDAGES/DRESSINGS) ×3 IMPLANT
GAUZE XEROFORM 1X8 LF (GAUZE/BANDAGES/DRESSINGS) IMPLANT
GAUZE XEROFORM 5X9 LF (GAUZE/BANDAGES/DRESSINGS) IMPLANT
GEL CELLERATE 28G (Miscellaneous) ×2 IMPLANT
GLOVE BIO SURGEON STRL SZ 6.5 (GLOVE) ×4 IMPLANT
GLOVE BIO SURGEONS STRL SZ 6.5 (GLOVE) ×2
GOWN STRL REUS W/ TWL XL LVL3 (GOWN DISPOSABLE) ×1 IMPLANT
GOWN STRL REUS W/TWL XL LVL3 (GOWN DISPOSABLE) ×3
HANDPIECE INTERPULSE COAX TIP (DISPOSABLE)
IV NS IRRIG 3000ML ARTHROMATIC (IV SOLUTION) IMPLANT
KIT TURNOVER CYSTO (KITS) ×3 IMPLANT
MANIFOLD NEPTUNE II (INSTRUMENTS) IMPLANT
NDL HYPO 30GX1 BEV (NEEDLE) IMPLANT
NEEDLE 27GAX1X1/2 (NEEDLE) IMPLANT
NEEDLE HYPO 30GX1 BEV (NEEDLE) IMPLANT
NS IRRIG 500ML POUR BTL (IV SOLUTION) ×3 IMPLANT
PACK BASIN DAY SURGERY FS (CUSTOM PROCEDURE TRAY) ×3 IMPLANT
PAD ABD 8X10 STRL (GAUZE/BANDAGES/DRESSINGS) ×2 IMPLANT
PADDING CAST ABS 3INX4YD NS (CAST SUPPLIES)
PADDING CAST ABS 4INX4YD NS (CAST SUPPLIES)
PADDING CAST ABS COTTON 3X4 (CAST SUPPLIES) IMPLANT
PADDING CAST ABS COTTON 4X4 ST (CAST SUPPLIES) IMPLANT
PENCIL BUTTON HOLSTER BLD 10FT (ELECTRODE) IMPLANT
SET HNDPC FAN SPRY TIP SCT (DISPOSABLE) IMPLANT
SLEEVE SCD COMPRESS KNEE MED (MISCELLANEOUS) IMPLANT
SPLINT PLASTER CAST XFAST 3X15 (CAST SUPPLIES) IMPLANT
SPLINT PLASTER XTRA FASTSET 3X (CAST SUPPLIES)
SPONGE LAP 18X18 X RAY DECT (DISPOSABLE) IMPLANT
SPONGE LAP 4X18 RFD (DISPOSABLE) IMPLANT
STAPLER VISISTAT 35W (STAPLE) IMPLANT
STOCKINETTE 4X48 STRL (DRAPES) IMPLANT
STOCKINETTE 6  STRL (DRAPES)
STOCKINETTE 6 STRL (DRAPES) IMPLANT
STOCKINETTE IMPERVIOUS LG (DRAPES) IMPLANT
STRIP CLOSURE SKIN 1/2X4 (GAUZE/BANDAGES/DRESSINGS) IMPLANT
SUCTION FRAZIER HANDLE 10FR (MISCELLANEOUS)
SUCTION TUBE FRAZIER 10FR DISP (MISCELLANEOUS) IMPLANT
SURGILUBE 2OZ TUBE FLIPTOP (MISCELLANEOUS) IMPLANT
SUT ETHILON 3 0 PS 1 (SUTURE) IMPLANT
SUT ETHILON 4 0 P 3 18 (SUTURE) IMPLANT
SUT ETHILON 5 0 PS 2 18 (SUTURE) IMPLANT
SUT PROLENE 3 0 PS 2 (SUTURE) IMPLANT
SUT SILK 3 0 PS 1 (SUTURE) IMPLANT
SUT VIC AB 3-0 FS2 27 (SUTURE) IMPLANT
SUT VIC AB 5-0 PS2 18 (SUTURE) IMPLANT
SYR BULB IRRIGATION 50ML (SYRINGE) IMPLANT
SYR CONTROL 10ML LL (SYRINGE) ×3 IMPLANT
TAPE HYPAFIX 6 X30' (GAUZE/BANDAGES/DRESSINGS)
TAPE HYPAFIX 6X30 (GAUZE/BANDAGES/DRESSINGS) IMPLANT
TIP RIGID 35CM EVICEL (HEMOSTASIS) IMPLANT
TOWEL OR 17X24 6PK STRL BLUE (TOWEL DISPOSABLE) ×3 IMPLANT
TRAY DSU PREP LF (CUSTOM PROCEDURE TRAY) IMPLANT
TUBE CONNECTING 12'X1/4 (SUCTIONS)
TUBE CONNECTING 12X1/4 (SUCTIONS) IMPLANT
UNDERPAD 30X30 (UNDERPADS AND DIAPERS) ×3 IMPLANT
WATER STERILE IRR 1000ML POUR (IV SOLUTION) ×3 IMPLANT
YANKAUER SUCT BULB TIP NO VENT (SUCTIONS) IMPLANT

## 2017-07-16 NOTE — Anesthesia Postprocedure Evaluation (Signed)
Anesthesia Post Note  Patient: [name]  Procedure(s) Performed: EXCISION OF RIGHT FOOT WOUND WITH CELLERATE PLACEMENT (Right ) [place] (Right )     Patient location during evaluation: PACU Anesthesia Type: General Level of consciousness: awake and alert Pain management: pain level controlled Vital Signs Assessment: post-procedure vital signs reviewed and stable Respiratory status: spontaneous breathing, nonlabored ventilation, respiratory function stable and patient connected to nasal cannula oxygen Cardiovascular status: blood pressure returned to baseline and stable Postop Assessment: no apparent nausea or vomiting Anesthetic complications: no    Last Vitals:  Vitals:   07/16/17 1200 07/16/17 1250  BP: 122/74 126/77  Pulse: (!) 58 (!) 59  Resp: (!) 4 16  Temp:  36.6 C  SpO2: 100% 100%    Last Pain:  Vitals:   07/16/17 1300  TempSrc:   PainSc: 0-No pain                 Ryan P Ellender

## 2017-07-16 NOTE — H&P (Signed)
[name] is an 47 y.o. male.   Chief Complaint: right foot wound HPI: The patient is a 47 yrs old bm here for his right foot wound.  He had been doing better but the wound opened.  He has been using the silver sock during the day and ky or wet to dry at night.  There is no sign of infection, no redness and no drainage.  The area is 1.5 x 3 cm in size.  The surrounding skin is callus and thick which is inhibiting healing. There is swelling but he is aware as he has been on his feet at work for the past 14 hours.  Past Medical History:  Diagnosis Date  . Atherosclerosis    Right lower extermity with  ulceration  . CKD (chronic kidney disease), stage III [facility])    nephrologist-  dr [name] ([facility] in [place])    . Diabetic foot ulcer with osteomyelitis ([place])    right lateral foot--- s/p  amputation 4th and 5th ray , ACELL , Wound VAC  . Diabetic peripheral neuropathy ([place])   . Elevated LFTs   . Hypertension   . Iron deficiency anemia   . Mixed hyperlipidemia   . Obesity (BMI 30-39.9)   . 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 Type 2 diabetes mellitus treated with insulin ([place])    last A1c 6.2 on 06/02/2017  . Vitamin B 12 deficiency     Past Surgical History:  Procedure Laterality Date  . AMPUTATION Right 07/31/2016   Procedure: AMPUTATION RIGHT FIFTH RAY AND APPLICATION OF WOUND VAC;  Surgeon: Dorna Leitz, MD;  Location: [facility];  Service: Orthopedics;  Laterality: Right;  . AMPUTATION Right 08/08/2016   Procedure: RAY AMPUTATION RIGHT FOURTH METATARSAL, DEBRIDEMENT OF SUBQ TISSUE,BONE AND SKIN WITH WOUND CLOSURE;  Surgeon: Dorna Leitz, MD;  Location: [facility];  Service:  Orthopedics;  Laterality: Right;  . ANAL FISSURE REPAIR  2000  . APPLICATION OF A-CELL OF EXTREMITY Right 08/13/2016   Procedure: APPLICATION OF A-CELL AND VAC;  Surgeon: Wallace Going, DO;  [facility]: [facility];  Service: Plastics;  Laterality: Right;  . APPLICATION OF WOUND VAC Right 09/18/2016   Procedure: APPLICATION OF WOUND VAC (PT. WILL BRING HIS OWN);  Surgeon: Wallace Going, DO;  [facility];  Service: Plastics;  Laterality: Right;  . APPLICATION OF WOUND VAC Right 10/30/2016   Procedure: APPLICATION OF WOUND VAC;  Surgeon: Wallace Going, DO;  Location: [place];  Service: Plastics;  Laterality: Right;  . APPLICATION OF WOUND VAC Right 12/01/2016   Procedure: APPLICATION OF WOUND VAC;  Surgeon: Wallace Going, DO;  [facility];  Service: Plastics;  Laterality: Right;  . CIRCUMCISION  2006  . I&D EXTREMITY Right 09/18/2016   Procedure: IRRIGATION AND DEBRIDEMENT OF RIGHT LATERAL DIABLTIC FOOT ULCER;  Surgeon: Wallace Going, DO;  Location: [place];  Service: Plastics;  Laterality: Right;  . I&D EXTREMITY Right 03/11/2017   Procedure: IRRIGATION AND DEBRIDEMENT OF RIGHT LATERAL FOOT WOUND WITH CELLERATE COLLAGEN;  Surgeon: Wallace Going, DO;  Location: [place];  Service: Plastics;  Laterality: Right;  . INCISION AND  DRAINAGE OF WOUND Right 08/13/2016   Procedure: IRRIGATION AND DEBRIDEMENT WOUND RIGHT FOOT;  Surgeon: Wallace Going, DO;  [facility];  Service: Plastics;  Laterality: Right;  . LAPAROSCOPIC CHOLECYSTECTOMY  09-09-2006   dr Zella Richer  . LOWER EXTREMITY ANGIOGRAPHY N/A 10/27/2016   Procedure: Lower Extremity Angiography;  Surgeon: Angelia Mould, MD;  Location: [place] [facility];  Service: Cardiovascular;  Laterality: N/A;  . MASS EXCISION Right 10/30/2016   Procedure: EXCISION RIGHT FOOT WOUND WITH VAC PLACEMENT;  Surgeon: Wallace Going, DO;  Location: [place];  Service: Plastics;  Laterality: Right;  . SKIN SPLIT GRAFT Right 12/01/2016   Procedure: SKIN GRAFT SPLIT THICKNESS TO RIGHT LATERAL FOOT WOUND 9 X 3 CM;  Surgeon: Wallace Going, DO;  [facility];  Service: Plastics;  Laterality: Right;  . wisdom teeth extracted  1984    Family History  Problem Relation Age of Onset  . Stroke Father        early 75s. former smoker  . Diabetes Father   . Hypertension Father   . Sarcoidosis Mother   . Hypertension Sister   . Healthy Daughter   . Diabetes Paternal Grandmother   . Congenital heart disease Paternal Grandmother        died of complications   Social History:  reports that he has never smoked. He has never used smokeless tobacco. He reports that he does not drink alcohol or use drugs.  Allergies: No Known Allergies  No medications prior to admission.    No results found for this or any previous visit (from the past 48 hour(s)). No results found.  Review of Systems  Constitutional: Negative.   HENT: Negative.   Eyes: Negative.   Respiratory: Negative.   Cardiovascular: Negative.   Gastrointestinal: Negative.   Genitourinary: Negative.   Musculoskeletal: Negative.   Skin: Negative.   Neurological: Negative.   Psychiatric/Behavioral: Negative.     Height 6' (1.829 m), weight 132 kg (291 lb). Physical Exam  Constitutional: He is oriented to person, place, and time. He appears well-developed and well-nourished.  HENT:  Head: Normocephalic and atraumatic.  Eyes: Pupils are equal, round, and reactive to light. EOM are normal.  Cardiovascular: Normal rate.  Respiratory: Effort normal.  GI: Soft.  Musculoskeletal: He exhibits tenderness. He exhibits no edema.  Neurological: He is alert and oriented to person, place, and time.  Skin: Skin is warm.  Psychiatric: He has a normal mood and affect. His behavior is normal. Judgment and thought content normal.      Assessment/Plan Debridement and excision to right foot wound with cellerate placement.  [place], DO 07/16/2017, 6:49 AM

## 2017-07-16 NOTE — Discharge Instructions (Signed)

## 2017-07-16 NOTE — Anesthesia Procedure Notes (Addendum)
Procedure Name: LMA Insertion Date/Time: 07/16/2017 10:32 AM Performed by: Murvin Natal, MD Pre-anesthesia Checklist: Patient identified, Emergency Drugs available, Suction available, Patient being monitored and Timeout performed Patient Re-evaluated:Patient Re-evaluated prior to induction Oxygen Delivery Method: Circle system utilized Preoxygenation: Pre-oxygenation with 100% oxygen Induction Type: IV induction Ventilation: Mask ventilation without difficulty LMA: LMA inserted LMA Size: 5.0 Number of attempts: 1 Placement Confirmation: breath sounds checked- equal and bilateral,  CO2 detector,  positive ETCO2 and ETT inserted through vocal cords under direct vision Tube secured with: Tape Dental Injury: Teeth and Oropharynx as per pre-operative assessment

## 2017-07-16 NOTE — Transfer of Care (Signed)
  Last Vitals:  Vitals Value Taken Time  BP 132/74 07/16/2017 11:15 AM  Temp    Pulse 68 07/16/2017 11:16 AM  Resp 11 07/16/2017 11:18 AM  SpO2 100 % 07/16/2017 11:16 AM  Vitals shown include unvalidated device data.  Last Pain:  Vitals:   07/16/17 0911  TempSrc:   PainSc: 0-No pain      Patients Stated Pain Goal: 5 (07/16/17 0911) Immediate Anesthesia Transfer of Care Note  Patient: [name]  Procedure(s) Performed: Procedure(s) (LRB): EXCISION OF RIGHT FOOT WOUND WITH CELLERATE PLACEMENT (Right) CELLERATE PLACEMENT (Right)  Patient Location: PACU  Anesthesia Type: General  Level of Consciousness: awake, alert  and oriented  Airway & Oxygen Therapy: Patient Spontanous Breathing and Patient connected to nasal cannula oxygen  Post-op Assessment: Report given to PACU RN and Post -op Vital signs reviewed and stable  Post vital signs: Reviewed and stable  Complications: No apparent anesthesia complications

## 2017-07-16 NOTE — Op Note (Signed)
DATE OF OPERATION: 07/16/2017  LOCATION: [place]  PREOPERATIVE DIAGNOSIS: chronic right foot wound  POSTOPERATIVE DIAGNOSIS: Same  PROCEDURE: Excisional of right foot wound 4 x 5 cm skin, soft tissue and 1 cm of muscle.  Placement of cellerate gel and 5 gm of powder.  SURGEON: Fariha Goto [place] Noble Cicalese, DO  ASSISTANT: Shawn Rayburn, PA  EBL: 10 cc  CONDITION: Stable  COMPLICATIONS: None  INDICATION: The patient, [name], is a 47 y.o. male born on 12-Aug-1970, is here for treatment of a chronic right foot wound.  He underwent excisional debridement in the past with a skin graft.  He has some resolution and healing but had breakdown once returning to work.   PROCEDURE DETAILS:  The patient was seen prior to surgery and marked.  The IV antibiotics were given. The patient was taken to the operating room and given a general anesthetic. A standard time out was performed and all information was confirmed by those in the room. SCD was placed on the left leg.   The #10 blade was used to debride the entire lateral aspect of the right foot.  Skin and soft tissue of the area and the wound 4 x 5 cm was excised.  The base 1 cm of muscle was excised as well.  The base and edges had reasonable healthy bleeding. Hemostasis was achieved with electrocautery. All of the cellerate was applied and the foot was wrapped with kerlex and an ace wrap.  The patient was allowed to wake up and taken to recovery room in stable condition at the end of the case. The family was notified at the end of the case.

## 2017-07-16 NOTE — Anesthesia Preprocedure Evaluation (Addendum)
Anesthesia Evaluation  Patient identified by MRN, date of birth, ID band Patient awake    Reviewed: Allergy & Precautions, NPO status , Patient's Chart, lab work & pertinent test results  Airway Mallampati: III  TM Distance: >3 FB Neck ROM: Full    Dental no notable dental hx.    Pulmonary neg pulmonary ROS,    Pulmonary exam normal breath sounds clear to auscultation       Cardiovascular hypertension, Pt. on medications + Peripheral Vascular Disease  Normal cardiovascular exam Rhythm:Regular Rate:Normal  ECG: SR, rate 84   Neuro/Psych negative neurological ROS  negative psych ROS   GI/Hepatic negative GI ROS, Neg liver ROS,   Endo/Other  diabetes, Insulin Dependent, Oral Hypoglycemic Agents  Renal/GU Renal disease     Musculoskeletal negative musculoskeletal ROS (+)   Abdominal (+) + obese,   Peds  Hematology HLD   Anesthesia Other Findings RIGHT FOOT WOUND  Reproductive/Obstetrics                            Anesthesia Physical Anesthesia Plan  ASA: III  Anesthesia Plan: General   Post-op Pain Management:    Induction: Intravenous  PONV Risk Score and Plan: 2 and Ondansetron and Treatment may vary due to age or medical condition  Airway Management Planned: LMA  Additional Equipment:   Intra-op Plan:   Post-operative Plan: Extubation in OR  Informed Consent: I have reviewed the patients History and Physical, chart, labs and discussed the procedure including the risks, benefits and alternatives for the proposed anesthesia with the patient or authorized representative who has indicated his/her understanding and acceptance.   Dental advisory given  Plan Discussed with: CRNA  Anesthesia Plan Comments:        Anesthesia Quick Evaluation

## 2017-07-16 NOTE — Interval H&P Note (Signed)
History and Physical Interval Note:  07/16/2017 9:39 AM  [name]  has presented today for surgery, with the diagnosis of RIGHT FOOT WOUND  The various methods of treatment have been discussed with the patient and family. After consideration of risks, benefits and other options for treatment, the patient has consented to  Procedure(s): EXCISION OF RIGHT FOOT WOUND WITH CELLERATE PLACEMENT (Right) [name] (Right) as a surgical intervention .  The patient's history has been reviewed, patient examined, no change in status, stable for surgery.  I have reviewed the patient's chart and labs.  Questions were answered to the patient's satisfaction.     Loel Lofty Dillingham

## 2017-07-16 NOTE — Op Note (Signed)
First Assist Op Note: [facility] I assisted the Surgeon(s) ___Dr. Lyndee Leo Dillingham______ on the procedure(s): _Irrigation and debridement of right lateral foot diabetic ulcer__on  Date __6/27/2019_______  I provided my assistance on this case as follows:  I was present and acted as first Environmental consultant during this operation. I was present during the patient transport into the operative suite and assisted the OR staff with transferring and positioning of the patient. All extremities were checked and properly cushioned and safety straps in place. I was involved in the prepping and placement of sterile drapes. A time out was performed and all information confirmed to be correct.  I first assisted during the case including retraction for exposure and application of sterile dressings. I provided assistance with application of post operative garments/splinting and assisted with patient transfer back to the stretcher as needed.   Alliyah Roesler,PA-C Plastic Surgery [phone]

## 2017-07-17 ENCOUNTER — Encounter (HOSPITAL_BASED_OUTPATIENT_CLINIC_OR_DEPARTMENT_OTHER): Payer: Self-pay | Admitting: Plastic Surgery

## 2017-07-29 ENCOUNTER — Encounter (HOSPITAL_BASED_OUTPATIENT_CLINIC_OR_DEPARTMENT_OTHER): Payer: Self-pay | Admitting: Plastic Surgery

## 2017-08-12 ENCOUNTER — Ambulatory Visit: Payer: BLUE CROSS/BLUE SHIELD | Admitting: Orthotics

## 2017-08-12 DIAGNOSIS — S98131A Complete traumatic amputation of one right lesser toe, initial encounter: Secondary | ICD-10-CM

## 2017-08-12 DIAGNOSIS — E1140 Type 2 diabetes mellitus with diabetic neuropathy, unspecified: Secondary | ICD-10-CM

## 2017-08-12 DIAGNOSIS — M79672 Pain in left foot: Secondary | ICD-10-CM

## 2017-08-12 DIAGNOSIS — E11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ical complication: Secondary | ICD-10-CM

## 2017-08-12 DIAGNOSIS — L03032 Cellulitis of left toe: Secondary | ICD-10-CM

## 2017-08-12 DIAGNOSIS — L02612 Cutaneous abscess of left foot: Secondary | ICD-10-CM

## 2017-08-12 DIAGNOSIS — M79671 Pain in right foot: Secondary | ICD-10-CM

## 2017-08-17 ENCOUNTER — Ambulatory Visit: Payer: BLUE CROSS/BLUE SHIELD | Admitting: Orthotics

## 2017-08-17 DIAGNOSIS — E1140 Type 2 diabetes mellitus with diabetic neuropathy, unspecified: Secondary | ICD-10-CM

## 2017-08-17 DIAGNOSIS — M79671 Pain in right foot: Secondary | ICD-10-CM

## 2017-08-17 DIAGNOSIS — E11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ical complication: Secondary | ICD-10-CM

## 2017-08-17 DIAGNOSIS — S98131A Complete traumatic amputation of one right lesser toe, initial encounter: Secondary | ICD-10-CM

## 2017-08-17 DIAGNOSIS — M79672 Pain in left foot: Secondary | ICD-10-CM

## 2017-08-17 NOTE — Progress Notes (Signed)
Patient was seen/cast today for a CROW type of walker.  He has had 4/5th ray amputated and has an active healing ulcer on lateral aspect of foot approx cuboid.  Goal is offload cuboid and immobilize foot until ulcer heals, then custom shoes.  Az to fab.

## 2017-08-19 NOTE — Progress Notes (Signed)
Patient needs a return visit to be cast for CROW walker.

## 2017-09-15 ENCOUNTER — Other Ambulatory Visit: Payer: Self-pay | Admitting: *Deleted

## 2017-09-15 MED ORDER — "INSULIN SYRINGE-NEEDLE U-100 31G X 5/16"" 0.5 ML MISC": 4 refills | Status: DC

## 2017-09-29 ENCOUNTER — Ambulatory Visit (INDEPENDENT_AMBULATORY_CARE_PROVIDER_SITE_OTHER): Payer: BLUE CROSS/BLUE SHIELD | Admitting: Family Medicine

## 2017-09-29 ENCOUNTER — Encounter: Payer: Self-pay | Admitting: Family Medicine

## 2017-09-29 VITALS — BP 126/78 | HR 63 | Temp 98.1°F | Ht 72.0 in | Wt 284.8 lb

## 2017-09-29 DIAGNOSIS — R109 Unspecified abdominal pain: Secondary | ICD-10-CM

## 2017-09-29 LAB — POCT URINALYSIS DIPSTICK
Bilirubin, UA: NEGATIVE
Blood, UA: NEGATIVE
Glucose, UA: NEGATIVE
Ketones, UA: NEGATIVE
Leukocytes, UA: NEGATIVE
Nitrite, UA: NEGATIVE
Protein, UA: NEGATIVE
Spec Grav, UA: 1.025 (ref 1.010–1.025)
Urobilinogen, UA: 0.2 E.U./dL
pH, UA: 5 (ref 5.0–8.0)

## 2017-09-29 MED ORDER — PREDNISONE 20 MG PO TABS: 20.0000 mg | ORAL_TABLET | Freq: Every day | ORAL | 0 refills | Status: DC

## 2017-09-29 MED ORDER — DICLOFENAC SODIUM 75 MG PO TBEC: 75.0000 mg | DELAYED_RELEASE_TABLET | Freq: Two times a day (BID) | ORAL | 0 refills | Status: DC

## 2017-09-29 NOTE — Progress Notes (Signed)
   Subjective:  [name] is a 47 y.o. male who presents today for same-day appointment with a chief complaint of flank pain.   HPI:  Flank Pain, Acute problem Started 3 days ago.  Located in right mid back.  Patient states that he woke up and noticed pain in the area.  No obvious precipitating events.  No history of trauma, falls, heavy lifting, or strenuous exercise.  Area feels very sore.  Feels like a constant pressure.  No hematuria.  No dysuria.  No specific treatments tried.  Pain does not radiate.  No obvious alleviating or aggravating factors.  ROS: Per HPI  PMH: He reports that he has never smoked. He has never used smokeless tobacco. He reports that he does not drink alcohol or use drugs.  Objective:  Physical Exam: BP 126/78 (BP Location: Left Arm, Patient Position: Sitting, Cuff Size: Large)   Pulse 63   Temp 98.1 F (36.7 C) (Oral)   Ht 6' (1.829 m)   Wt 284 lb 12.8 oz (129.2 kg)   SpO2 99%   BMI 38.63 kg/m   Gen: NAD, resting comfortably CV: RRR with no murmurs appreciated Pulm: NWOB, CTAB with no crackles, wheezes, or rhonchi GI: Normal bowel sounds present. Soft, Nontender, Nondistended. MSK: -Back: No deformities.  Tender to palpation along right lower costal border.  No CVA tenderness. -Legs: Strength 5 out of 5 throughout.  Sensation light touch grossly intact throughout. Results for orders placed or performed in visit on 09/29/17 (from the past 24 hour(s))  POCT urinalysis dipstick     Status: Normal   Collection Time: 09/29/17 11:42 AM  Result Value Ref Range   Color, UA Yellow    Clarity, UA Clear    Glucose, UA Negative Negative   Bilirubin, UA Negative    Ketones, UA Negative    Spec Grav, UA 1.025 1.010 - 1.025   Blood, UA Negative    pH, UA 5.0 5.0 - 8.0   Protein, UA Negative Negative   Urobilinogen, UA 0.2 0.2 or 1.0 E.U./dL   Nitrite, UA Negative    Leukocytes, UA Negative Negative   Appearance     Odor       Assessment/Plan:   Right flank pain Likely secondary to muscular strain.  His UA is negative for blood and infection.  He does not have any red flag signs or symptoms.  His neurological exam is normal.  We will start low-dose prednisone for 5 days.  Patient is not a candidate for NSAIDs given his history of CKD.  Discussed close monitoring of his blood sugar over the next few days.  Discussed other potential side effects of prednisone including insomnia and elevated blood sugars.  Discussed reasons to return to care.  If no improvement in the next few days, will consider renal imaging to rule out other possible causes.  [name] Jerline Pain, MD 09/29/2017 12:11 PM

## 2017-09-29 NOTE — Patient Instructions (Addendum)
It was very nice to see you today!  I think you probably have a muscle strain. Your urine today is normal - you have no signs of a kidney issue.   Please start the prednisone and keep a close eye on your blood sugars.   Please let me or Dr Yong Channel know if your symptoms do not improve in 7-10 days. We may need to do further testing if not improving.   Take care, Dr Jerline Pain

## 2017-09-30 ENCOUNTER — Telehealth: Payer: Self-pay

## 2017-09-30 NOTE — Telephone Encounter (Signed)
Called and left VM letting patient know diclofenac was called in by error and he is only to take prednisone.  Copied from [facility] [phone]. Topic: General - Other >> Sep 29, 2017  3:24 PM Carolyn Stare wrote:  Pt saw Dimas Chyle and when he went to the pharmacy they gave him 2 RX Prednisone and Diclofenac-sodium and is asking if he was suppose to pick up both

## 2017-10-06 ENCOUNTER — Ambulatory Visit (INDEPENDENT_AMBULATORY_CARE_PROVIDER_SITE_OTHER): Payer: BLUE CROSS/BLUE SHIELD | Admitting: Family Medicine

## 2017-10-06 ENCOUNTER — Encounter: Payer: Self-pay | Admitting: Family Medicine

## 2017-10-06 VITALS — BP 114/76 | HR 79 | Temp 98.0°F | Ht 72.0 in | Wt 285.0 lb

## 2017-10-06 DIAGNOSIS — N183 Chronic kidney disease, stage 3 unspecified: Secondary | ICD-10-CM

## 2017-10-06 DIAGNOSIS — Z23 Encounter for immunization: Secondary | ICD-10-CM | POA: Diagnosis not present

## 2017-10-06 DIAGNOSIS — I10 Essential (primary) hypertension: Secondary | ICD-10-CM

## 2017-10-06 DIAGNOSIS — E11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ical complication: Secondary | ICD-10-CM | POA: Diagnosis not present

## 2017-10-06 LAB — BASIC METABOLIC PANEL
BUN: 33 mg/dL — ABNORMAL HIGH (ref 6–23)
CO2: 28 mEq/L (ref 19–32)
Calcium: 10 mg/dL (ref 8.4–10.5)
Chloride: 103 mEq/L (ref 96–112)
Creatinine, Ser: 1.53 mg/dL — ABNORMAL HIGH (ref 0.40–1.50)
GFR: 62.93 mL/min (ref >60.00)
Glucose, Bld: 95 mg/dL (ref 70–99)
Potassium: 4 mEq/L (ref 3.5–5.1)
Sodium: 138 mEq/L (ref 135–145)

## 2017-10-06 LAB — CBC
HCT: 37.9 % — ABNORMAL LOW (ref 39.0–52.0)
Hemoglobin: 12.4 g/dL — ABNORMAL LOW (ref 13.0–17.0)
MCHC: 32.7 g/dL (ref 30.0–36.0)
MCV: 79.2 fl (ref 78.0–100.0)
Platelets: 327 10*3/uL (ref 150.0–400.0)
RBC: 4.78 Mil/uL (ref 4.22–5.81)
RDW: 13.8 % (ref 11.5–15.5)
WBC: 10.2 10*3/uL (ref 4.0–10.5)

## 2017-10-06 LAB — HEMOGLOBIN A1C: Hgb A1c MFr Bld: 6.6 % — ABNORMAL HIGH (ref 4.6–6.5)

## 2017-10-06 NOTE — Progress Notes (Signed)
Subjective:  [name] is a 46 y.o. year old very pleasant male patient who presents for/with See problem oriented charting ROS- no fever, chills. Wound on foot is healing. Some edema on right leg, none on left.    Past Medical History-  Patient Active Problem List   Diagnosis Date Noted  . Diabetic foot ulcer with osteomyelitis ([place]) 07/29/2016    Priority: High  . Morbid obesity ([place]) 03/08/2015    Priority: High  . Type II diabetes mellitus with neurological manifestations ([place]) 03/07/2015    Priority: High  . Chronic kidney disease (CKD), stage III (moderate) ([place]) 11/17/2016    Priority: Medium  . Diabetic neuropathy ([place]) 11/17/2016    Priority: Medium  . Elevated LFTs     Priority: Medium  . Dyslipidemia 03/08/2015    Priority: Medium  . HTN (hypertension) 03/08/2015    Priority: Medium  . Obesity, Class II, BMI 35-39.9 06/02/2017    Priority: Low  . B12 deficiency 11/17/2016    Priority: Low  . Iron deficiency anemia 11/17/2016    Priority: Low  . 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 aspirin EC 81 MG tablet Take 81 mg by mouth daily.    Marland Kitchen atorvastatin (LIPITOR) 40 MG tablet Take 1.5 tablets (60 mg total) by mouth every morning. 135 tablet 3  . Cyanocobalamin (VITAMIN B-12) 5000 MCG SUBL Place 5,000 mcg under the tongue daily.    . feeding supplement, GLUCERNA SHAKE, (GLUCERNA SHAKE) LIQD Take 237 mLs by mouth 2 (two) times daily between meals. 180 Can 3  . fenofibrate (TRICOR) 48 MG tablet Take 1 tablet (48 mg total) by mouth daily. (Patient taking differently: Take 48 mg by mouth every morning. ) 90 tablet 3  . ferrous sulfate 325 (65 FE) MG tablet Take 1 tablet (325 mg total) by mouth 2 (two)  times daily with a meal. 60 tablet 3  . hydrochlorothiazide (MICROZIDE) 12.5 MG capsule Take 1 capsule (12.5 mg total) by mouth every morning. (Patient taking differently: Take 12.5 mg by mouth every morning. ) 90 capsule 3  . insulin glargine (LANTUS) 100 UNIT/ML injection Inject 0.2-0.35 mLs (20-35 Units total) into the skin at bedtime. 30 mL 3  . insulin lispro (HUMALOG) 100 UNIT/ML injection Inject 0.05-0.2 mLs (5-20 Units total) into the skin 2 (two) times daily. 40 mL 3  . Insulin Syringe-Needle U-100 (BD INSULIN SYRINGE U/F) 31G X 5/16" 0.5 ML MISC Use to inject insulin 4 times daily 100 each 4  . Multiple Vitamin (MULTIVITAMIN WITH MINERALS) TABS tablet Take 1 tablet by mouth 2 (two) times daily.    . Omega-3 Fatty Acids (FISH OIL) 1360 MG CAPS Take 1,360 mg by mouth daily.     Marland Kitchen pyridoxine (B-6) 100 MG tablet Take 100 mg by mouth 2 (two) times daily.    . ramipril (ALTACE) 5 MG capsule Take 1 capsule (5 mg total) by mouth every morning. (Patient taking differently: Take 5 mg by mouth every morning. ) 90 capsule 3  . sitaGLIPtin (JANUVIA) 50 MG tablet Take 1 tablet (50 mg total) by mouth every morning. 90 tablet 3   No current facility-administered medications for this visit.     Objective: BP 114/76 (  BP Location: Left Arm, Patient Position: Sitting, Cuff Size: Large)   Pulse 79   Temp 98 F (36.7 C) (Oral)   Ht 6' (1.829 m)   Wt 285 lb (129.3 kg)   SpO2 100%   BMI 38.65 kg/m  Gen: NAD, resting comfortably CV: RRR no murmurs rubs or gallops Lungs: CTAB no crackles, wheeze, rhonchi Ext: no edema on left Skin: warm, dry. Pictured below- Right foot- Open wound now under dime sized when previously over 3 x 3 cm per patient report Neuro: speech and gait normal    Assessment/Plan:  Other notes: 1.  Did well with repeat surgery in June with [facility]- healing up 2. Right mid back pain- did 5 days of prednisone and saw some improvement but still hurting when he lays down- has  to fight to find right position- able to sleep reasonably well once finds right position. Should avoid nsaids due to kidneys. He is improving- should return if doesn't continue to improve.  3. Back at [facility] and down 16 lbs!   Type II diabetes mellitus with neurological manifestations ([place]) S:  controlled on lantus 30 units--> has moved down to 20 or under -->  and humalog 15-20--> now down to 15 max units with 2 largest meals of day along with [place]. Prior MD removed metformin due to "being on long time" but definitely an option to restart. Has tried to eat healthier diet and sugars improving CBGs- mostly under 155 with many #s into 90s. A few celebration nights- led to sugars up to 180s but no #s over 200 Exercise and diet-  weight stable- goal was 10 lbs off- has been in with [facility] recently and starting to see some results (down 16 lbs from peak) Lab Results  Component Value Date   HGBA1C 6.2 06/02/2017   HGBA1C 6.3 02/02/2017   HGBA1C 5.7 (H) 10/27/2016   A/P: continue current medicines- may have to adjust further with insulin as loses more weight  Chronic kidney disease (CKD), stage III (moderate) ([facility]) S: knows to avoid nsaids. Last GFR just under 60. Has seen nephrology A/P: will monitor kidney function today. Remain on ramipril. Consider cutting hctz if worsening kidney function in future  HTN (hypertension) S: controlled on ramipril 5mg , hctz 12.5mg  BP Readings from Last 3 Encounters:  10/06/17 114/76  09/29/17 126/78  07/16/17 126/77  A/P: We discussed blood pressure goal of <140/90. Continue current meds. Possibly cut hctz out at future visit if continues to lose ewight and BP trends down  Return in about 4 months (around 02/05/2018) for follow up- or sooner if needed.  Lab/Order associations: Type II diabetes mellitus with neurological manifestations ([place]) - Plan: Basic metabolic panel, Hemoglobin A1c, CBC  Chronic kidney disease (CKD), stage III (moderate)  (HCC)  Essential hypertension  Need for prophylactic vaccination with combined diphtheria-tetanus-pertussis (DTP) vaccine - Plan: Tdap vaccine greater than or equal to 7yo IM  Need for prophylactic vaccination and inoculation against influenza - Plan: Flu vaccine HIGH DOSE PF (Fluzone High dose)  Return precautions advised.  [name], MD

## 2017-10-06 NOTE — Assessment & Plan Note (Signed)
S:  controlled on lantus 30 units--> has moved down to 20 or under -->  and humalog 15-20--> now down to 15 max units with 2 largest meals of day along with [place]. Prior MD removed metformin due to "being on long time" but definitely an option to restart. Has tried to eat healthier diet and sugars improving CBGs- mostly under 155 with many #s into 90s. A few celebration nights- led to sugars up to 180s but no #s over 200 Exercise and diet-  weight stable- goal was 10 lbs off- has been in with [facility] recently and starting to see some results (down 16 lbs from peak) Lab Results  Component Value Date   HGBA1C 6.2 06/02/2017   HGBA1C 6.3 02/02/2017   HGBA1C 5.7 (H) 10/27/2016   A/P: continue current medicines- may have to adjust further with insulin as loses more weight

## 2017-10-06 NOTE — Assessment & Plan Note (Signed)
S: controlled on ramipril 5mg , hctz 12.5mg  BP Readings from Last 3 Encounters:  10/06/17 114/76  09/29/17 126/78  07/16/17 126/77  A/P: We discussed blood pressure goal of <140/90. Continue current meds. Possibly cut hctz out at future visit if continues to lose ewight and BP trends down

## 2017-10-06 NOTE — Assessment & Plan Note (Addendum)
S: knows to avoid nsaids. Last GFR just under 60. Has seen nephrology A/P: will monitor kidney function today. Remain on ramipril. Consider cutting hctz if worsening kidney function in future

## 2017-10-06 NOTE — Patient Instructions (Addendum)
Health Maintenance Due  Topic Date Due  . INFLUENZA VACCINE - done today 08/20/2017  . TETANUS/TDAP- done today 09/23/2017   Great job on weight loss.   Let us know if any more low blood sugars so we can help with adjustments on going down on insulin as you continue to work on weight loss- great job once again  Please stop by lab before you go

## 2017-10-15 ENCOUNTER — Other Ambulatory Visit: Payer: BLUE CROSS/BLUE SHIELD | Admitting: Orthotics

## 2017-10-22 ENCOUNTER — Ambulatory Visit (INDEPENDENT_AMBULATORY_CARE_PROVIDER_SITE_OTHER): Payer: BLUE CROSS/BLUE SHIELD | Admitting: Orthotics

## 2017-10-22 DIAGNOSIS — S98131A Complete traumatic amputation of one right lesser toe, initial encounter: Secondary | ICD-10-CM | POA: Diagnosis not present

## 2017-10-22 DIAGNOSIS — L02612 Cutaneous abscess of left foot: Secondary | ICD-10-CM

## 2017-10-22 DIAGNOSIS — M79671 Pain in right foot: Secondary | ICD-10-CM | POA: Diagnosis not present

## 2017-10-22 DIAGNOSIS — M79672 Pain in left foot: Secondary | ICD-10-CM

## 2017-10-22 DIAGNOSIS — E11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ical complication: Secondary | ICD-10-CM

## 2017-10-22 DIAGNOSIS — L03032 Cellulitis of left toe: Secondary | ICD-10-CM

## 2017-10-22 DIAGNOSIS — E1140 Type 2 diabetes mellitus with diabetic neuropathy, unspecified: Secondary | ICD-10-CM

## 2017-10-22 NOTE — Progress Notes (Signed)
[name]  came in to pick up [name]   The brace fit well and immediately conformed to [name]'s partial foot.. The brace should greatly assist in wound care management as it offers protection from sheer forces.. [name] was able to don and doff brace with minimal difficulty.  Overall [name] pleased with fit and functionality of brace.

## 2017-10-27 ENCOUNTER — Ambulatory Visit (INDEPENDENT_AMBULATORY_CARE_PROVIDER_SITE_OTHER): Payer: BLUE CROSS/BLUE SHIELD | Admitting: Podiatry

## 2017-10-27 ENCOUNTER — Encounter: Payer: Self-pay | Admitting: Podiatry

## 2017-10-27 DIAGNOSIS — S98131A Complete traumatic amputation of one right lesser toe, initial encounter: Secondary | ICD-10-CM | POA: Diagnosis not present

## 2017-10-27 DIAGNOSIS — L02612 Cutaneous abscess of left foot: Secondary | ICD-10-CM

## 2017-10-27 DIAGNOSIS — E1140 Type 2 diabetes mellitus with diabetic neuropathy, unspecified: Secondary | ICD-10-CM | POA: Diagnosis not present

## 2017-10-27 DIAGNOSIS — E11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ical complication: Secondary | ICD-10-CM

## 2017-10-27 DIAGNOSIS — L03032 Cellulitis of left toe: Secondary | ICD-10-CM | POA: Diagnosis not present

## 2017-11-01 NOTE — Progress Notes (Signed)
Subjective:   Patient ID: [name], male   DOB: 47 y.o.   MRN: [id number]   HPI Patient presents wearing a Crow walker right after having had amputation of the right lateral foot with crusted tissue with chronic irritation of the tissue but no current breakdown   ROS      Objective:  Physical Exam  Neurovascular status unchanged from previous visits with thick keratotic tissue lateral side right foot localized with no active drainage noted but irritated type tissue     Assessment:  [name] walker seems to be giving this patient benefit with no current breakdown of tissue     Plan:  Cleaned up some of the thickened tissue advised this patient on continued daily inspections and if any issues were to occur to reappoint immediately and continue Crow walker

## 2017-12-07 ENCOUNTER — Other Ambulatory Visit: Payer: Self-pay | Admitting: Podiatry

## 2017-12-07 ENCOUNTER — Ambulatory Visit (INDEPENDENT_AMBULATORY_CARE_PROVIDER_SITE_OTHER): Payer: BLUE CROSS/BLUE SHIELD | Admitting: Podiatry

## 2017-12-07 ENCOUNTER — Encounter: Payer: Self-pay | Admitting: Podiatry

## 2017-12-07 ENCOUNTER — Ambulatory Visit (INDEPENDENT_AMBULATORY_CARE_PROVIDER_SITE_OTHER): Payer: BLUE CROSS/BLUE SHIELD

## 2017-12-07 DIAGNOSIS — M79672 Pain in left foot: Secondary | ICD-10-CM

## 2017-12-07 DIAGNOSIS — L03032 Cellulitis of left toe: Secondary | ICD-10-CM | POA: Diagnosis not present

## 2017-12-07 DIAGNOSIS — L02612 Cutaneous abscess of left foot: Secondary | ICD-10-CM | POA: Diagnosis not present

## 2017-12-07 MED ORDER — CIPROFLOXACIN HCL 500 MG PO TABS: 500.0000 mg | ORAL_TABLET | Freq: Two times a day (BID) | ORAL | 0 refills | Status: DC

## 2017-12-07 MED ORDER — AMOXICILLIN-POT CLAVULANATE 875-125 MG PO TABS: 1.0000 | ORAL_TABLET | Freq: Two times a day (BID) | ORAL | 0 refills | Status: DC

## 2017-12-07 NOTE — Patient Instructions (Signed)

## 2017-12-08 NOTE — Progress Notes (Signed)
Subjective:   Patient ID: [name], male   DOB: 47 y.o.   MRN: [id number]   HPI Patient states he had a hard callus on bottom of his left bone and admits he should have come in earlier and is been present for several weeks.  Patient has had amputation of his right lateral foot and states he knows he did not do what he should have.  States his sugars been running well with his last sugar 100 and his A1c 6.6.  Patient has not had any fever or systemic signs of infection   ROS      Objective:  Physical Exam  Neurovascular status was found to be on change with patient found to have thick keratotic lesion sub-fifth metatarsal left with moderate breakdown of tissue that local with no proximal edema erythema or drainage noted and normal temperature today.  Patient has mild odor of the area with no active drainage and the area measures approximately 1.5 x 1.5 cm     Assessment:  Localized ulceration plantar aspect left fifth metatarsal with patient with long-term diabetes with neuropathic changes who is in good control at the current time     Plan:  H&P condition reviewed and sterile debridement of the area accomplished flush the area did not note any tracts of drainage and did not note any abscess formation and nothing for me to culture and I went ahead today applied Iodosorb to the area instructed on home soaks placed him in a wedge shoes there is no pressure against the area along with padding and placed him on Cipro and Augmentin currently.  Gave him strict instructions of any systemic signs of infection were to occur he is to go straight to the emergency room and contact us and I did explain that there is a possibility that he is can end up with some form of amputation associated with this condition  X-rays were at this time negative for ostial lysis but it is difficult to say whether there may be a process that is not showing up yet within the bone.  Reappoint 1 week to recheck

## 2017-12-14 ENCOUNTER — Other Ambulatory Visit: Payer: Self-pay | Admitting: Family Medicine

## 2017-12-14 NOTE — Telephone Encounter (Signed)
Call placed to pt; spoke with wife.  Reported that the [facility] that this Rx was previously sent to in May, has closed.  She is requesting that the Hydrochlorothiazide be forwarded to the [facility] on [address] in [place].  Advised pt's wife, this will be taken care of.

## 2017-12-14 NOTE — Telephone Encounter (Addendum)
Copied from [place] [id number]. Topic: Quick Communication - See Telephone Encounter >> Dec 14, 2017  8:11 AM [name] wrote: CRM for notification. See Telephone encounter for: 12/14/17. Patient would like a 90day refill on his hydrochlorothiazide (MICROZIDE) 12.5 MG capsule medication and have it sent to his preferred pharmacy [facility] [place], [place] - [address] [phone] (Phone) [phone] (Fax)

## 2017-12-15 MED ORDER — HYDROCHLOROTHIAZIDE 12.5 MG PO CAPS: 12.5000 mg | ORAL_CAPSULE | ORAL | 3 refills | Status: DC

## 2017-12-15 NOTE — Telephone Encounter (Signed)
Requested Prescriptions  Pending Prescriptions Disp Refills  . hydrochlorothiazide (MICROZIDE) 12.5 MG capsule 90 capsule 3    Sig: Take 1 capsule (12.5 mg total) by mouth every morning.     Cardiovascular: Diuretics - Thiazide Failed - 12/14/2017 11:19 AM      Failed - Cr in normal range and within 360 days    Creatinine, Ser  Date Value Ref Range Status  10/06/2017 1.53 (H) 0.40 - 1.50 mg/dL Final         Passed - Ca in normal range and within 360 days    Calcium  Date Value Ref Range Status  10/06/2017 10.0 8.4 - 10.5 mg/dL Final         Passed - K in normal range and within 360 days    Potassium  Date Value Ref Range Status  10/06/2017 4.0 3.5 - 5.1 mEq/L Final         Passed - Na in normal range and within 360 days    Sodium  Date Value Ref Range Status  10/06/2017 138 135 - 145 mEq/L Final  03/13/2016 140 137 - 147 Final         Passed - Last BP in normal range    BP Readings from Last 1 Encounters:  10/06/17 114/76         Passed - Valid encounter within last 6 months    Recent Outpatient Visits          2 months ago Type II diabetes mellitus with neurological manifestations ([place])   [facility] [place] Regulus, Brayton Mars, MD   2 months ago Flank pain   [place] Parker, Algis Greenhouse, MD   6 months ago Type II diabetes mellitus with neurological manifestations [facility])   [place] Hunter, Brayton Mars, MD   10 months ago Type II diabetes mellitus with neurological manifestations [facility])   [facility] [place], Brayton Mars, MD   1 year ago Type II diabetes mellitus with neurological manifestations [facility])   [facility] Regulus, Brayton Mars, MD      Future Appointments            In 1 month Hunter, Brayton Mars, MD [place], [place]

## 2017-12-16 ENCOUNTER — Ambulatory Visit (INDEPENDENT_AMBULATORY_CARE_PROVIDER_SITE_OTHER): Payer: BLUE CROSS/BLUE SHIELD | Admitting: Podiatry

## 2017-12-16 ENCOUNTER — Encounter: Payer: Self-pay | Admitting: Podiatry

## 2017-12-16 ENCOUNTER — Ambulatory Visit (INDEPENDENT_AMBULATORY_CARE_PROVIDER_SITE_OTHER): Payer: BLUE CROSS/BLUE SHIELD

## 2017-12-16 DIAGNOSIS — L97522 Non-pressure chronic ulcer of other part of left foot with fat layer exposed: Secondary | ICD-10-CM | POA: Diagnosis not present

## 2017-12-16 DIAGNOSIS — L02612 Cutaneous abscess of left foot: Secondary | ICD-10-CM

## 2017-12-16 DIAGNOSIS — L03032 Cellulitis of left toe: Secondary | ICD-10-CM

## 2017-12-16 MED ORDER — CIPROFLOXACIN HCL 500 MG PO TABS: 500.0000 mg | ORAL_TABLET | Freq: Two times a day (BID) | ORAL | 0 refills | Status: DC

## 2017-12-16 MED ORDER — AMOXICILLIN-POT CLAVULANATE 875-125 MG PO TABS: 1.0000 | ORAL_TABLET | Freq: Two times a day (BID) | ORAL | 0 refills | Status: DC

## 2017-12-16 NOTE — Progress Notes (Signed)
Subjective:   Patient ID: [name], male   DOB: 47 y.o.   MRN: [id number]   HPI Patient presents stating he is doing the best he can with this and is not really hurting but is not healing and he is wearing the wedge shoe but he has to work and he cannot wear the wedge shoe for work.  States that he has noted some drainage when he soaks   ROS      Objective:  Physical Exam  Long-term diabetic who is in good control now but was in poor control for a long time who has had amputation right fifth metatarsal and fourth and fifth digits who is developed a large ulceration plantar aspect left fifth metatarsal that is approximately 2 cm x 2 cm with subcutaneous exposure     Assessment:  Significant ulceration plantar aspect left fifth metatarsal with history of amputation right     Plan:  H&P x-ray reviewed condition discussed.  I do think this is can require bone surgery with removal of a portion of the fifth metatarsal and fifth digit due to lucency starting to occur in the size of the ulceration and history of amputation right.  I am referring this patient to Dr. March Rummage for evaluation next week and most likely surgical consultation.  I made the patient aware of this and at this time I flushed the wound applied Iodosorb and applied sterile dressing and instructed on continued wet-to-dry dressings and the wearing of the wedge shoe as best as possible  X-rays do not indicate significant ostial lysis but I do think there is lucency that is possibly forming in good chance there is osteo-present.  Patient will continue with Augmentin and Cipro currently

## 2017-12-25 ENCOUNTER — Ambulatory Visit (INDEPENDENT_AMBULATORY_CARE_PROVIDER_SITE_OTHER): Payer: BLUE CROSS/BLUE SHIELD | Admitting: Podiatry

## 2017-12-25 DIAGNOSIS — L97323 Non-pressure chronic ulcer of left ankle with necrosis of muscle: Secondary | ICD-10-CM

## 2017-12-25 DIAGNOSIS — M21962 Unspecified acquired deformity of left lower leg: Secondary | ICD-10-CM

## 2017-12-25 DIAGNOSIS — L03032 Cellulitis of left toe: Secondary | ICD-10-CM

## 2017-12-25 DIAGNOSIS — L02612 Cutaneous abscess of left foot: Secondary | ICD-10-CM | POA: Diagnosis not present

## 2017-12-25 DIAGNOSIS — E08622 Diabetes mellitus due to underlying condition with other skin ulcer: Secondary | ICD-10-CM

## 2017-12-25 MED ORDER — AMOXICILLIN-POT CLAVULANATE 875-125 MG PO TABS: 1.0000 | ORAL_TABLET | Freq: Two times a day (BID) | ORAL | 0 refills | Status: DC

## 2017-12-25 MED ORDER — CIPROFLOXACIN HCL 500 MG PO TABS: 500.0000 mg | ORAL_TABLET | Freq: Two times a day (BID) | ORAL | 0 refills | Status: DC

## 2017-12-25 NOTE — Patient Instructions (Signed)
Pre-Operative Instructions  Congratulations, you have decided to take an important step towards improving your quality of life.  You can be assured that the doctors and staff at [facility] will be with you every step of the way.  Here are some important things you should know:  1. Plan to be at the surgery center/hospital at least 1 (one) hour prior to your scheduled time, unless otherwise directed by the surgical center/hospital staff.  You must have a responsible adult accompany you, remain during the surgery and drive you home.  Make sure you have directions to the surgical center/hospital to ensure you arrive on time. 2. If you are having surgery at [facility] or [facility] hospitals, you will need a copy of your medical history and physical form from your family physician within one month prior to the date of surgery. We will give you a form for your primary physician to complete.  3. We make every effort to accommodate the date you request for surgery.  However, there are times where surgery dates or times have to be moved.  We will contact you as soon as possible if a change in schedule is required.   4. No aspirin/ibuprofen for one week before surgery.  If you are on aspirin, any non-steroidal anti-inflammatory medications (Mobic, Aleve, Ibuprofen) should not be taken seven (7) days prior to your surgery.  You make take Tylenol for pain prior to surgery.  5. Medications - If you are taking daily heart and blood pressure medications, seizure, reflux, allergy, asthma, anxiety, pain or diabetes medications, make sure you notify the surgery center/hospital before the day of surgery so they can tell you which medications you should take or avoid the day of surgery. 6. No food or drink after midnight the night before surgery unless directed otherwise by surgical center/hospital staff. 7. No alcoholic beverages 24-hours prior to surgery.  No smoking 24-hours prior or 24-hours after  surgery. 8. Wear loose pants or shorts. They should be loose enough to fit over bandages, boots, and casts. 9. Don't wear slip-on shoes. Sneakers are preferred. 10. Bring your boot with you to the surgery center/hospital.  Also bring crutches or a walker if your physician has prescribed it for you.  If you do not have this equipment, it will be provided for you after surgery. 11. If you have not been contacted by the surgery center/hospital by the day before your surgery, call to confirm the date and time of your surgery. 12. Leave-time from work may vary depending on the type of surgery you have.  Appropriate arrangements should be made prior to surgery with your employer. 13. Prescriptions will be provided immediately following surgery by your doctor.  Fill these as soon as possible after surgery and take the medication as directed. Pain medications will not be refilled on weekends and must be approved by the doctor. 14. Remove nail polish on the operative foot and avoid getting pedicures prior to surgery. 15. Wash the night before surgery.  The night before surgery wash the foot and leg well with water and the antibacterial soap provided. Be sure to pay special attention to beneath the toenails and in between the toes.  Wash for at least three (3) minutes. Rinse thoroughly with water and dry well with a towel.  Perform this wash unless told not to do so by your physician.  Enclosed: 1 Ice pack (please put in freezer the night before surgery)   1 Hibiclens skin cleaner     Pre-op instructions  If you have any questions regarding the instructions, please do not hesitate to call our office.  [place]: [address], [place], [place] [address] -- [phone]  [place]: [address] [place], [place] [address] -- [phone]  [place]: [address]  [place], [place] [address] -- [phone]  [place]: [address], [address], [place], [place] [address] -- [phone]  Website:  [email or web address] 

## 2017-12-28 ENCOUNTER — Telehealth: Payer: Self-pay | Admitting: Podiatry

## 2017-12-28 ENCOUNTER — Telehealth: Payer: Self-pay | Admitting: Family Medicine

## 2017-12-28 ENCOUNTER — Other Ambulatory Visit: Payer: Self-pay

## 2017-12-28 ENCOUNTER — Encounter (HOSPITAL_BASED_OUTPATIENT_CLINIC_OR_DEPARTMENT_OTHER): Payer: Self-pay | Admitting: *Deleted

## 2017-12-28 NOTE — Telephone Encounter (Signed)
Delydia, this Tammy from [facility]. I just spoke to Mr. [name] and he stated he did take his H&P to his PCP on Friday when you scheduled him. When you get those, please fax those to me at [phone]. Thank you. Bye bye.

## 2017-12-28 NOTE — Telephone Encounter (Signed)
See note  Copied from [place] [id number]. Topic: General - Other >> Dec 28, 2017  2:27 PM Carolyn Stare wrote:  [facility] call to ask if the H&P form has been filled out on the pt cause they need it fax to [phone] . Pt surgery is 12/30/17

## 2017-12-28 NOTE — Telephone Encounter (Signed)
[name], this is Alyse Low over at [facility]. We need Dr. March Rummage to put the orders in for this case on Wednesday. Thank you.

## 2017-12-29 ENCOUNTER — Telehealth: Payer: Self-pay | Admitting: Podiatry

## 2017-12-29 ENCOUNTER — Telehealth: Payer: Self-pay

## 2017-12-29 NOTE — Telephone Encounter (Signed)
[name] with Dr. Ansel Bong office called regarding [name] We received paperwork yesterday in the office that he is scheduled for surgery on 11 December and the paperwork was for the H&P. We have not seen this pt in 3 months as his last visit with [place] was 10/06/2017. 24 hour notice is not enough time to get him in to be seen and get this form completed. Dr. Yong Channel can see [name] tomorrow or the 13 th. Please call me back when you get this at [phone]. Thanks.

## 2017-12-29 NOTE — Telephone Encounter (Signed)
Thank you Valery! :)

## 2017-12-29 NOTE — Telephone Encounter (Signed)
May change him to tresiba- please let him know works very similarly to lantus

## 2017-12-29 NOTE — Telephone Encounter (Signed)
Tammy from [facility] called regarding a note in regards to [name] not being seen by his PCP since 09/2017. I don't know if he is going to have to be moved until a later date and let you guys get his H&P to [place]. My call back is [phone] if needed. Bye bye.

## 2017-12-29 NOTE — Telephone Encounter (Signed)
Left message for pt to call and schedule an appointment with Dr. March Rummage on Thursday per Dr. Eleanora Neighbor request. Pt was scheduled for surgery on Wednesday but his PCP wants to see him first and we would not have the H&P in time to send to [facility] for pt to have his surgery tomorrow. Asked pt to call us back at [phone] to schedule an appointment with Dr. March Rummage on Thursday, 12 December.

## 2017-12-29 NOTE — Telephone Encounter (Signed)
Patients wife called wanting to know why her husbands surgery scheduled for tomorrow was canceled. I told her he needed to have a H&P completed by Dr. Yong Channel per [facility] requirements prior to surgery. I told her I was filling in for the surgical coordinator this week so I could not speak on behalf of what happened when pt was in the office prior to this week and had his surgery scheduled. She then wanted to know when he could have his surgery rescheduled to. I told her it depended on what times the hospital had available as our doctors don't have blocked times at the hospital. [name] stated she would contact Dr. Ansel Bong office to get this expedited since she is scheduled to have surgery with Dr. Doran Durand.

## 2017-12-29 NOTE — Progress Notes (Signed)
Note from Dr Ansel Bong office noted in [place], [place] at Dr Price's office notified that patient will need an office appointment before H&P form can be filled out. I also left Mr [name] a voice message letting him know this.

## 2017-12-29 NOTE — Telephone Encounter (Signed)
PA initiated via covermymeds.com  Solan [name] Key: [id number]  Need help? [phone]  Lantus 100UNIT/ML solution has been rejected by insurance.  17% The chance that your patient will receive their medication if you attempt a prior authorization. Your patient is more likely to start therapy on these medications for this plan.  Using [facility] [phone] published formulary we have determined that preferred agents for your patient are likely: Basaglar KwikPen Preferred  30%  Levemir Preferred  31%  Levemir FlexTouch Preferred  37%  Tyler Aas FlexTouch Preferred  46%  Download a PDF of this information to discuss alternative therapies with the prescriber.

## 2017-12-29 NOTE — Telephone Encounter (Addendum)
Per covermymeds.com  Preferred products are available at a lower cost. Can your patient be switched to a preferred drug/product?  Has the patient tried and had an inadequate treatment response or intolerance to the required number of formulary alternatives below. If yes, then documentation is required for approval (Drug Name, Trial Year, and Reason for Failure). Note: Formulary Alternatives should be prescribed first unless the patient is unable to use or receive treatment with the alternative.  [name] (Key: [id number])   This request has received an Unfavorable outcome. An eAppeal may be available. View the bottom of the request to see if an eAppeal is available along with any additional information provided by [employer].   Forwarding to Dr. Yong Channel.

## 2017-12-29 NOTE — Telephone Encounter (Signed)
[place] - [name] states pt dropped off the H&P on 12/25/2017 and his doctor is not in the office today, they will schedule pt for appt to evaluate for surgical H&P tomorrow, and wanted to make sure this worked for pt's surgery schedule. I told [name], that would be fine we would be rescheduling pt's surgery, because he did need the H&P prior to surgery.

## 2017-12-30 ENCOUNTER — Telehealth: Payer: Self-pay | Admitting: Family Medicine

## 2017-12-30 ENCOUNTER — Ambulatory Visit (HOSPITAL_BASED_OUTPATIENT_CLINIC_OR_DEPARTMENT_OTHER): Admission: RE | Admit: 2017-12-30 | Payer: BLUE CROSS/BLUE SHIELD | Source: Ambulatory Visit | Admitting: Podiatry

## 2017-12-30 SURGERY — BIOPSY, BONE
Anesthesia: Monitor Anesthesia Care | Laterality: Left

## 2017-12-30 NOTE — Telephone Encounter (Signed)
Left message for Roselyn Reef, Dr. Ansel Bong nurse to call me back in regards to patients H&P form from message left earlier this morning by patients wife.

## 2017-12-30 NOTE — Telephone Encounter (Signed)
Patient's surgery was rescheduled. Patient is scheduled to come in for a surgical clearance visit on Friday

## 2017-12-30 NOTE — Telephone Encounter (Signed)
See note  Copied from [place] [id number]. Topic: General - Other >> Dec 29, 2017  5:02 PM Yvette Rack wrote: Reason for CRM: Patient wife Lelan Pons stated that the History and Physical form was not received in time and patient surgery was canceled. Patient wife stated that the form needs to be completed as soon as possible so patient can be re-scheduled for surgery this week because she is scheduled to have surgery next week. Cb# [phone]

## 2017-12-30 NOTE — Telephone Encounter (Signed)
See note  Copied from [place] [id number]. Topic: General - Other >> Dec 30, 2017 12:28 PM Cecelia Byars, NT wrote: Reason for VAN:VBTYOMA from  [facility] called and said the patient is supposed to have surgery  is getting clearance by Dr Yong Channel first , she has question after a conversation with his wife and would like a call from [employer] ,  please call  [phone]

## 2017-12-30 NOTE — Telephone Encounter (Signed)
Called and spoke to pt's wife. She stated he has an appt with his PCP on Friday and once he is done, will hand deliver the papers to our office. Wants to know if he can have surgery on Monday morning so he can take her to and from her surgery at the hospital on Tuesday. I tentatively scheduled an appointment for pt to be seen at 12:15 tomorrow, Thursday 12 December to see Dr. March Rummage for his foot.

## 2017-12-30 NOTE — Telephone Encounter (Signed)
Spoke with pt's wife and told her we got her husband scheduled for Monday, 16 December at 7:15 am for surgery. I told her Dr. March Rummage still wants to see him tomorrow, Thursday, 12 December at 12:15 pm to check and make sure everything looks okay. Pt's wife asked about pre-admission and I told her I would check on that and let them know in the morning.

## 2017-12-30 NOTE — Telephone Encounter (Signed)
This is [name]'s wife calling. I'm calling to see if you have the H&P in your office. I'm trying to get his surgery rescheduled for today, tomorrow, or early Monday morning at the latest. Please call me back as soon as you get this message at [phone].

## 2017-12-30 NOTE — Telephone Encounter (Signed)
Called and spoke to patient's wife earlier today. He is scheduled for a surgical clearance visit on Friday this week

## 2017-12-30 NOTE — Telephone Encounter (Signed)
Spoke with patient's wife and he is scheduled this Friday to have form completed

## 2017-12-31 ENCOUNTER — Encounter: Payer: Self-pay | Admitting: Podiatry

## 2017-12-31 ENCOUNTER — Ambulatory Visit (INDEPENDENT_AMBULATORY_CARE_PROVIDER_SITE_OTHER): Payer: BLUE CROSS/BLUE SHIELD | Admitting: Podiatry

## 2017-12-31 DIAGNOSIS — E08621 Diabetes mellitus due to underlying condition with foot ulcer: Secondary | ICD-10-CM

## 2017-12-31 DIAGNOSIS — M8580 Other specified disorders of bone density and structure, unspecified site: Secondary | ICD-10-CM

## 2017-12-31 DIAGNOSIS — E669 Obesity, unspecified: Secondary | ICD-10-CM

## 2017-12-31 DIAGNOSIS — E11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ical complication: Secondary | ICD-10-CM

## 2017-12-31 DIAGNOSIS — N183 Chronic kidney disease, stage 3 unspecified: Secondary | ICD-10-CM

## 2017-12-31 DIAGNOSIS — M21962 Unspecified acquired deformity of left lower leg: Secondary | ICD-10-CM | POA: Diagnosis not present

## 2017-12-31 DIAGNOSIS — L97421 Non-pressure chronic ulcer of left heel and midfoot limited to breakdown of skin: Secondary | ICD-10-CM

## 2017-12-31 DIAGNOSIS — E1169 Type 2 diabetes mellitus with other specified complication: Secondary | ICD-10-CM

## 2017-12-31 DIAGNOSIS — Z89619 Acquired absence of unspecified leg above knee: Secondary | ICD-10-CM

## 2017-12-31 NOTE — Telephone Encounter (Signed)
Called and spoke with pt. Told him we have him scheduled for surgery at [facility] at 7:15 am Monday, 16 December. I told him they would be in touch with him about information and wether or not he needs pre-admission. Reminded pt of his appointment at 12:15 pm today with Dr. March Rummage.

## 2018-01-01 ENCOUNTER — Other Ambulatory Visit: Payer: Self-pay

## 2018-01-01 ENCOUNTER — Ambulatory Visit (INDEPENDENT_AMBULATORY_CARE_PROVIDER_SITE_OTHER): Payer: BLUE CROSS/BLUE SHIELD | Admitting: Family Medicine

## 2018-01-01 ENCOUNTER — Encounter: Payer: Self-pay | Admitting: Family Medicine

## 2018-01-01 ENCOUNTER — Other Ambulatory Visit: Payer: BLUE CROSS/BLUE SHIELD

## 2018-01-01 ENCOUNTER — Telehealth: Payer: Self-pay | Admitting: Podiatry

## 2018-01-01 ENCOUNTER — Encounter (HOSPITAL_COMMUNITY): Payer: Self-pay | Admitting: *Deleted

## 2018-01-01 VITALS — BP 116/80 | HR 68 | Temp 98.2°F | Resp 16 | Ht 72.0 in | Wt 273.8 lb

## 2018-01-01 DIAGNOSIS — E11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ical complication: Secondary | ICD-10-CM | POA: Diagnosis not present

## 2018-01-01 DIAGNOSIS — I10 Essential (primary) hypertension: Secondary | ICD-10-CM | POA: Diagnosis not present

## 2018-01-01 DIAGNOSIS — E785 Hyperlipidemia, unspecified: Secondary | ICD-10-CM

## 2018-01-01 LAB — COMPREHENSIVE METABOLIC PANEL
ALT: 29 U/L (ref 0–53)
AST: 20 U/L (ref 0–37)
Albumin: 4 g/dL (ref 3.5–5.2)
Alkaline Phosphatase: 84 U/L (ref 39–117)
BUN: 25 mg/dL — ABNORMAL HIGH (ref 6–23)
CO2: 27 mEq/L (ref 19–32)
Calcium: 9.7 mg/dL (ref 8.4–10.5)
Chloride: 106 mEq/L (ref 96–112)
Creatinine, Ser: 1.23 mg/dL (ref 0.40–1.50)
GFR: 80.87 mL/min (ref >60.00)
Glucose, Bld: 112 mg/dL — ABNORMAL HIGH (ref 70–99)
Potassium: 4.2 mEq/L (ref 3.5–5.1)
Sodium: 142 mEq/L (ref 135–145)
Total Bilirubin: 0.4 mg/dL (ref 0.2–1.2)
Total Protein: 7.6 g/dL (ref 6.0–8.3)

## 2018-01-01 LAB — CBC
HCT: 37.3 % — ABNORMAL LOW (ref 39.0–52.0)
Hemoglobin: 12.2 g/dL — ABNORMAL LOW (ref 13.0–17.0)
MCHC: 32.8 g/dL (ref 30.0–36.0)
MCV: 79.2 fl (ref 78.0–100.0)
Platelets: 387 10*3/uL (ref 150.0–400.0)
RBC: 4.71 Mil/uL (ref 4.22–5.81)
RDW: 13 % (ref 11.5–15.5)
WBC: 8.9 10*3/uL (ref 4.0–10.5)

## 2018-01-01 LAB — LIPID PANEL
Cholesterol: 126 mg/dL (ref 0–200)
HDL: 41.7 mg/dL (ref >39.00)
LDL Cholesterol: 71 mg/dL (ref 0–99)
NonHDL: 84.51
Total CHOL/HDL Ratio: 3
Triglycerides: 66 mg/dL (ref 0.0–149.0)
VLDL: 13.2 mg/dL (ref 0.0–40.0)

## 2018-01-01 NOTE — Telephone Encounter (Signed)
This is Sherlynn Stalls, Therapist, sports with Pre Admissions. We need Dr. March Rummage to enter orders for Mr. Frazee's surgery scheduled for Monday morning. If you need to reach me, my number is [phone].

## 2018-01-01 NOTE — Progress Notes (Signed)
Pt denies SOB, chest pain, and being under the care of a cardiologist. Pt denies having a stress test, echo and cardiac cath. Pt denies having an EKG and chest x ray within the last year. Pt made aware to take half dose of Lantus insulin the night before surgery ( 10 units) no Januvia on DOS and no Humalog Lispro insulin unless BG is > 220 then take half of sliding scale. Pt made aware to stop taking vitamins, fish oil and herbal medications. Do not take any NSAIDs ie: Ibuprofen, Advil, Naproxen (Aleve), Motrin, BC and Goody Powder. Pt verbalized understanding of all pre-op instructions.

## 2018-01-01 NOTE — Assessment & Plan Note (Addendum)
S: Well controlled on Lantus 20 units and Humalog usually 15 units twice a day-along with Januvia.  Prior physician removed patient from metformin due to "being on long time"-definitely an option to restart if needed. CBGs- had been slightly high lately but still not too bad.  Exercise and diet- exercise limited by food, trying to eatin healthy Lab Results  Component Value Date   HGBA1C 6.6 (H) 10/06/2017   HGBA1C 6.2 06/02/2017   HGBA1C 6.3 02/02/2017   A/P: stable, continue current rx.  Since updating labs today-can simply get point-of-care A1c next visit unless postsurgical other providers want other labs

## 2018-01-01 NOTE — Patient Instructions (Addendum)
Please stop by lab before you go  I am going to submit your form today to [facility]- sorry for the delay but the new form requires visit within 30 days of surgery unfortunately

## 2018-01-01 NOTE — Progress Notes (Signed)
Phone: [phone]  Subjective:  Patient presents today for H+P before left 5th metatarsal bone biopsy- under area of recurrent callus formation and concern for potential infection.  Chief complaint-noted.   See problem oriented charting ROS- No chest pain or shortness of breath. No headache or blurry vision.   The following were reviewed and entered/updated in epic: Past Medical History:  Diagnosis Date  . Atherosclerosis    Right lower extermity with  ulceration  . CKD (chronic kidney disease), stage III [facility])    nephrologist-  dr [name] ([facility] in [place])    . Diabetic foot ulcer with osteomyelitis ([place])    right lateral foot--- s/p  amputation 4th and 5th ray , ACELL , Wound VAC  . Diabetic peripheral neuropathy ([place])   . Elevated LFTs   . Foot ulcer ([place])    left foot  . Hypertension   . Iron deficiency anemia   . Mixed hyperlipidemia   . Obesity (BMI 30-39.9)   . 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 Type 2 diabetes mellitus treated with insulin ([place])    last A1c 6.2 on 06/02/2017  . Vitamin B 12 deficiency    Patient Active Problem List   Diagnosis Date Noted  . Diabetic foot ulcer with osteomyelitis ([place]) 07/29/2016    Priority: High  . Morbid obesity ([place]) 03/08/2015    Priority: High  . Type II diabetes mellitus with neurological manifestations ([place]) 03/07/2015    Priority: High  . Chronic kidney disease (CKD), stage III (moderate) ([place]) 11/17/2016    Priority: Medium  . Diabetic neuropathy ([place]) 11/17/2016    Priority: Medium  . Elevated LFTs     Priority: Medium  . Dyslipidemia 03/08/2015    Priority: Medium  . HTN (hypertension) 03/08/2015   Priority: Medium  . Obesity, Class II, BMI 35-39.9 06/02/2017    Priority: Low  . B12 deficiency 11/17/2016    Priority: Low  . Iron deficiency anemia 11/17/2016    Priority: Low  . 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 Status post split thickness skin graft 12/08/2016  . Benign essential hypertension 03/20/2015  . Hypercholesterolemia 03/20/2015  . Polyneuropathy in diabetes ([place]) 03/20/2015   Past Surgical History:  Procedure Laterality Date  . AMPUTATION Right 07/31/2016   Procedure: AMPUTATION RIGHT FIFTH RAY AND APPLICATION OF WOUND VAC;  Surgeon: Dorna Leitz, MD;  Location: [facility];  Service: Orthopedics;  Laterality: Right;  . AMPUTATION Right 08/08/2016   Procedure: RAY AMPUTATION RIGHT FOURTH METATARSAL, DEBRIDEMENT OF SUBQ TISSUE,BONE AND SKIN WITH WOUND CLOSURE;  Surgeon: Dorna Leitz, MD;  Location: [facility];  Service: Orthopedics;  Laterality: Right;  . ANAL FISSURE REPAIR  2000  . APPLICATION OF A-CELL OF EXTREMITY Right 08/13/2016   Procedure: APPLICATION OF A-CELL AND VAC;  Surgeon: Wallace Going, DO;  [facility]: [facility];  Service: Plastics;  Laterality: Right;  . APPLICATION OF A-CELL OF EXTREMITY Right 07/16/2017   Procedure: CELLERATE PLACEMENT;  Surgeon: Wallace Going, DO;  Location: [place];  Service: Clinical cytogeneticist;  Laterality: Right;  . APPLICATION OF WOUND VAC Right 09/18/2016   Procedure: APPLICATION OF WOUND VAC (PT. WILL BRING HIS OWN);  Surgeon: Wallace Going, DO;  [facility];  Service: Plastics;  Laterality: Right;  . APPLICATION OF WOUND VAC Right 10/30/2016   Procedure: APPLICATION OF WOUND VAC;  Surgeon: Wallace Going, DO;  Location: [place];  Service: Plastics;  Laterality: Right;  . APPLICATION OF WOUND VAC Right  12/01/2016   Procedure: APPLICATION OF WOUND VAC;  Surgeon: Wallace Going, DO;  [facility];  Service: Plastics;  Laterality: Right;  . CIRCUMCISION  2006  . DEBRIDEMENT  FOOT Right 07/16/2017  . I&D EXTREMITY Right 09/18/2016   Procedure: IRRIGATION AND DEBRIDEMENT OF RIGHT LATERAL DIABLTIC FOOT ULCER;  Surgeon: Wallace Going, DO;  Location: [place];  Service: Plastics;  Laterality: Right;  . I&D EXTREMITY Right 03/11/2017   Procedure: IRRIGATION AND DEBRIDEMENT OF RIGHT LATERAL FOOT WOUND WITH CELLERATE COLLAGEN;  Surgeon: Wallace Going, DO;  Location: [place];  Service: Plastics;  Laterality: Right;  . INCISION AND DRAINAGE OF WOUND Right 08/13/2016   Procedure: IRRIGATION AND DEBRIDEMENT WOUND RIGHT FOOT;  Surgeon: Wallace Going, DO;  [facility];  Service: Plastics;  Laterality: Right;  . LAPAROSCOPIC CHOLECYSTECTOMY  09-09-2006   dr Zella Richer  . LOWER EXTREMITY ANGIOGRAPHY N/A 10/27/2016   Procedure: Lower Extremity Angiography;  Surgeon: Angelia Mould, MD;  Location: [place] [facility];  Service: Cardiovascular;  Laterality: N/A;  . MASS EXCISION Right 10/30/2016   Procedure: EXCISION RIGHT FOOT WOUND WITH VAC PLACEMENT;  Surgeon: Wallace Going, DO;  Location: [place];  Service: Plastics;  Laterality: Right;  . MASS EXCISION Right 07/16/2017   Procedure: EXCISION OF RIGHT FOOT WOUND WITH CELLERATE PLACEMENT;  Surgeon: Wallace Going, DO;  Location: [place];  Service: Plastics;  Laterality: Right;  . SKIN SPLIT GRAFT Right 12/01/2016   Procedure: SKIN GRAFT SPLIT THICKNESS TO RIGHT LATERAL FOOT WOUND 9 X 3 CM;  Surgeon: Wallace Going, DO;  [facility];  Service: Plastics;  Laterality: Right;  . wisdom teeth extracted  1984    Family History  Problem Relation Age of Onset  . Stroke Father        early 22s. former smoker  . Diabetes Father   .  Hypertension Father   . Sarcoidosis Mother   . Hypertension Sister   . Healthy Daughter   . Diabetes Paternal Grandmother   . Congenital heart disease Paternal Grandmother        died of complications    Medications- reviewed and updated Current Outpatient Medications  Medication Sig Dispense Refill  . aspirin EC 81 MG tablet Take 81 mg by mouth daily.    Marland Kitchen atorvastatin (LIPITOR) 40 MG tablet Take 1.5 tablets (60 mg total) by mouth every morning. (Patient taking differently: Take 60 mg by mouth daily. In the morning.) 135 tablet 3  . Cyanocobalamin (VITAMIN B-12) 5000 MCG SUBL Place 5,000 mcg under the tongue daily.    . feeding supplement, GLUCERNA SHAKE, (GLUCERNA SHAKE) LIQD Take 237 mLs by mouth 2 (two) times daily between meals. 180 Can 3  . fenofibrate (TRICOR) 48 MG tablet Take 1 tablet (48 mg total) by mouth daily. (Patient taking differently: Take 48 mg by mouth daily. In the morning.) 90 tablet 3  . ferrous sulfate 325 (65 FE) MG  tablet Take 1 tablet (325 mg total) by mouth 2 (two) times daily with a meal. 60 tablet 3  . hydrochlorothiazide (MICROZIDE) 12.5 MG capsule Take 1 capsule (12.5 mg total) by mouth every morning. (Patient taking differently: Take 12.5 mg by mouth daily. In the morning.) 90 capsule 3  . insulin glargine (LANTUS) 100 UNIT/ML injection Inject 0.2-0.35 mLs (20-35 Units total) into the skin at bedtime. (Patient taking differently: Inject 20 Units into the skin at bedtime. ) 30 mL 3  . insulin lispro (HUMALOG) 100 UNIT/ML injection Inject 0.05-0.2 mLs (5-20 Units total) into the skin 2 (two) times daily. (Patient taking differently: Inject 5-20 Units into the skin 2 (two) times daily. Sliding Scale Insulin) 40 mL 3  . Insulin Syringe-Needle U-100 (BD INSULIN SYRINGE U/F) 31G X 5/16" 0.5 ML MISC Use to inject insulin 4 times daily 100 each 4  . Multiple Vitamin (MULTIVITAMIN WITH MINERALS) TABS tablet Take 1 tablet by mouth 2 (two) times daily.    . Omega-3 Fatty  Acids (FISH OIL) 1360 MG CAPS Take 1,360 mg by mouth daily.     Marland Kitchen pyridoxine (B-6) 100 MG tablet Take 100 mg by mouth 2 (two) times daily.    . ramipril (ALTACE) 5 MG capsule Take 1 capsule (5 mg total) by mouth every morning. (Patient taking differently: Take 5 mg by mouth daily. In the morning.) 90 capsule 3  . sitaGLIPtin (JANUVIA) 50 MG tablet Take 1 tablet (50 mg total) by mouth every morning. 90 tablet 3   No current facility-administered medications for this visit.     Allergies-reviewed and updated No Known Allergies  Social History   Social History Narrative   Married. 46 year old daughter 10/2016.       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       Hobbies: drawing, archery, tinkering    Objective: BP 116/80 (BP Location: Left Arm, Patient Position: Sitting, Cuff Size: Normal)   Pulse 68   Temp 98.2 F (36.8 C) (Oral)   Resp 16   Ht 6' (1.829 m)   Wt 273 lb 12.8 oz (124.2 kg)   SpO2 98%   BMI 37.13 kg/m  Gen: NAD, resting comfortably HEENT: Mucous membranes are moist. Oropharynx normal Neck: no thyromegaly CV: RRR no murmurs rubs or gallops Lungs: CTAB no crackles, wheeze, rhonchi Abdomen: soft/nontender/nondistended/normal bowel sounds. No rebound or guarding.  Ext: no edema. Left foot in walking boot- did not remove. Known ulceration right foot- not examined today Skin: warm, dry Neuro: grossly normal, moves all extremities, PERRLA  Assessment/Plan:  Able to complete 4 mets of activity without chest pain or shortness of bretah- no further cardiac clearance needed.  Reasonable to proceed with surgery for biopsy of fifth metatarsal   Hypertension  s: controlled on ramipril 5 mg and hydrochlorothiazide 12.5 mg BP Readings from Last 3 Encounters:  01/01/18 116/80  10/06/17 114/76  09/29/17 126/78  A/P: Stable-we discussed blood pressure goal of <140/90. Continue current meds   Hyperlipidemia S:  controlled on atorvastatin 60 mg daily.   Patient also on [facility] Lab Results  Component Value Date   CHOL 153 02/01/2016   HDL 38 02/01/2016   LDLCALC 95 02/01/2016   LDLDIRECT 61.0 02/02/2017   TRIG 90 02/01/2016   CHOLHDL 2.2 01/27/2007   A/P: Hopefully stable-update full lipid panel today.   Type II diabetes mellitus with neurological manifestations ([place]) S: Well controlled on Lantus 20 units and Humalog usually 15 units twice a day-along with  Januvia.  Prior physician removed patient from metformin due to "being on long time"-definitely an option to restart if needed. CBGs- had been slightly high lately but still not too bad.  Exercise and diet- exercise limited by food, trying to eatin healthy Lab Results  Component Value Date   HGBA1C 6.6 (H) 10/06/2017   HGBA1C 6.2 06/02/2017   HGBA1C 6.3 02/02/2017   A/P: stable, continue current rx.  Since updating labs today-can simply get point-of-care A1c next visit unless postsurgical other providers want other labs  Future Appointments  Date Time Provider [place]  01/07/2018  1:00 PM [facility] NURSE [facility]  01/14/2018  3:30 PM [facility]URSE [facility]  02/04/2018 10:00 AM Marin Olp, MD [facility]  Point-of-care A1c next visit  Lab/Order associations: Dyslipidemia - Plan: CBC, Comprehensive metabolic panel, Lipid panel  Type II diabetes mellitus with neurological manifestations [facility])  Essential hypertension  Return precautions advised.  [name], MD

## 2018-01-01 NOTE — Progress Notes (Signed)
Pt stated that he will only respond to " [name]  when waking up from anesthesia, my brain does not respond to [name]."

## 2018-01-04 ENCOUNTER — Encounter (HOSPITAL_COMMUNITY)
Admission: RE | Disposition: A | Discharge status: Home / Self Care | Payer: Self-pay | Source: Home / Self Care | Attending: Podiatry

## 2018-01-04 ENCOUNTER — Ambulatory Visit (HOSPITAL_COMMUNITY): Payer: BLUE CROSS/BLUE SHIELD

## 2018-01-04 ENCOUNTER — Other Ambulatory Visit: Payer: Self-pay

## 2018-01-04 ENCOUNTER — Encounter (HOSPITAL_COMMUNITY): Payer: Self-pay | Admitting: Certified Registered"

## 2018-01-04 ENCOUNTER — Ambulatory Visit (HOSPITAL_COMMUNITY)
Admission: RE | Admit: 2018-01-04 | Discharge: 2018-01-04 | Disposition: A | Discharge status: Home / Self Care | Payer: BLUE CROSS/BLUE SHIELD | Attending: Podiatry | Admitting: Podiatry

## 2018-01-04 ENCOUNTER — Ambulatory Visit (HOSPITAL_COMMUNITY): Payer: BLUE CROSS/BLUE SHIELD | Admitting: Certified Registered"

## 2018-01-04 DIAGNOSIS — L97524 Non-pressure chronic ulcer of other part of left foot with necrosis of bone: Secondary | ICD-10-CM | POA: Diagnosis not present

## 2018-01-04 DIAGNOSIS — Z89421 Acquired absence of other right toe(s): Secondary | ICD-10-CM | POA: Diagnosis not present

## 2018-01-04 DIAGNOSIS — M216X2 Other acquired deformities of left foot: Secondary | ICD-10-CM | POA: Diagnosis not present

## 2018-01-04 DIAGNOSIS — Z6835 Body mass index (BMI) 35.0-35.9, adult: Secondary | ICD-10-CM | POA: Insufficient documentation

## 2018-01-04 DIAGNOSIS — E1122 Type 2 diabetes mellitus with diabetic chronic kidney disease: Secondary | ICD-10-CM | POA: Diagnosis not present

## 2018-01-04 DIAGNOSIS — E11621 Type 2 diabetes mellitus with foot ulcer: Secondary | ICD-10-CM | POA: Diagnosis not present

## 2018-01-04 DIAGNOSIS — E669 Obesity, unspecified: Secondary | ICD-10-CM | POA: Diagnosis not present

## 2018-01-04 DIAGNOSIS — E1142 Type 2 diabetes mellitus with diabetic polyneuropathy: Secondary | ICD-10-CM | POA: Diagnosis not present

## 2018-01-04 DIAGNOSIS — Z794 Long term (current) use of insulin: Secondary | ICD-10-CM | POA: Diagnosis not present

## 2018-01-04 DIAGNOSIS — N183 Chronic kidney disease, stage 3 (moderate): Secondary | ICD-10-CM | POA: Diagnosis not present

## 2018-01-04 DIAGNOSIS — I129 Hypertensive chronic kidney disease with stage 1 through stage 4 chronic kidney disease, or unspecified chronic kidney disease: Secondary | ICD-10-CM | POA: Diagnosis not present

## 2018-01-04 DIAGNOSIS — M86672 Other chronic osteomyelitis, left ankle and foot: Secondary | ICD-10-CM | POA: Diagnosis not present

## 2018-01-04 DIAGNOSIS — Z9889 Other specified postprocedural states: Secondary | ICD-10-CM

## 2018-01-04 HISTORY — PX: METATARSAL HEAD EXCISION: SHX5027

## 2018-01-04 HISTORY — DX: Non-pressure chronic ulcer of other part of unspecified foot with unspecified severity: L97.509

## 2018-01-04 HISTORY — PX: BONE BIOPSY: SHX375

## 2018-01-04 LAB — GLUCOSE, CAPILLARY
Glucose-Capillary: 101 mg/dL — ABNORMAL HIGH (ref 70–99)
Glucose-Capillary: 102 mg/dL — ABNORMAL HIGH (ref 70–99)

## 2018-01-04 SURGERY — BIOPSY, BONE
Anesthesia: Monitor Anesthesia Care | Site: Foot | Laterality: Left

## 2018-01-04 MED ORDER — PROPOFOL 10 MG/ML IV BOLUS
INTRAVENOUS | Status: DC | PRN
  Administered 2018-01-04: 20 mg via INTRAVENOUS
  Administered 2018-01-04: 40 mg via INTRAVENOUS
  Administered 2018-01-04: 30 mg via INTRAVENOUS
  Administered 2018-01-04: 40 mg via INTRAVENOUS
  Administered 2018-01-04: 30 mg via INTRAVENOUS

## 2018-01-04 MED ORDER — PROPOFOL 10 MG/ML IV BOLUS
INTRAVENOUS | Status: AC
  Filled 2018-01-04: qty 20

## 2018-01-04 MED ORDER — BUPIVACAINE HCL 0.5 % IJ SOLN
INTRAMUSCULAR | Status: AC
  Filled 2018-01-04: qty 1

## 2018-01-04 MED ORDER — ONDANSETRON HCL 4 MG/2ML IJ SOLN
INTRAMUSCULAR | Status: AC
  Filled 2018-01-04: qty 2

## 2018-01-04 MED ORDER — MIDAZOLAM HCL 5 MG/5ML IJ SOLN
INTRAMUSCULAR | Status: DC | PRN
  Administered 2018-01-04: 2 mg via INTRAVENOUS

## 2018-01-04 MED ORDER — LACTATED RINGERS IV SOLN
INTRAVENOUS | Status: DC | PRN
  Administered 2018-01-04: 07:00:00 via INTRAVENOUS

## 2018-01-04 MED ORDER — 0.9 % SODIUM CHLORIDE (POUR BTL) OPTIME
TOPICAL | Status: DC | PRN
  Administered 2018-01-04: 1000 mL

## 2018-01-04 MED ORDER — FENTANYL CITRATE (PF) 100 MCG/2ML IJ SOLN: 25.0000 ug | INTRAMUSCULAR | Status: DC | PRN

## 2018-01-04 MED ORDER — MIDAZOLAM HCL 2 MG/2ML IJ SOLN
INTRAMUSCULAR | Status: AC
  Filled 2018-01-04: qty 2

## 2018-01-04 MED ORDER — ONDANSETRON HCL 4 MG/2ML IJ SOLN
INTRAMUSCULAR | Status: DC | PRN
  Administered 2018-01-04: 4 mg via INTRAVENOUS

## 2018-01-04 MED ORDER — DEXAMETHASONE SODIUM PHOSPHATE 10 MG/ML IJ SOLN
INTRAMUSCULAR | Status: AC
  Filled 2018-01-04: qty 1

## 2018-01-04 MED ORDER — BUPIVACAINE HCL 0.5 % IJ SOLN
INTRAMUSCULAR | Status: DC | PRN
  Administered 2018-01-04: 10 mL

## 2018-01-04 MED ORDER — CLINDAMYCIN HCL 150 MG PO CAPS: 150.0000 mg | ORAL_CAPSULE | Freq: Two times a day (BID) | ORAL | 0 refills | Status: DC

## 2018-01-04 MED ORDER — VANCOMYCIN HCL 1000 MG IV SOLR
INTRAVENOUS | Status: AC
  Filled 2018-01-04: qty 1000

## 2018-01-04 MED ORDER — EPHEDRINE SULFATE-NACL 50-0.9 MG/10ML-% IV SOSY
PREFILLED_SYRINGE | INTRAVENOUS | Status: DC | PRN
  Administered 2018-01-04 (×2): 5 mg via INTRAVENOUS

## 2018-01-04 MED ORDER — DEXTROSE 5 % IV SOLN
3.0000 g | INTRAVENOUS | Status: AC
  Administered 2018-01-04: 3 g via INTRAVENOUS
  Filled 2018-01-04: qty 3000

## 2018-01-04 MED ORDER — GLYCOPYRROLATE PF 0.2 MG/ML IJ SOSY
PREFILLED_SYRINGE | INTRAMUSCULAR | Status: AC
  Filled 2018-01-04: qty 1

## 2018-01-04 MED ORDER — LIDOCAINE 2% (20 MG/ML) 5 ML SYRINGE
INTRAMUSCULAR | Status: DC | PRN
  Administered 2018-01-04: 40 mg via INTRAVENOUS

## 2018-01-04 MED ORDER — FENTANYL CITRATE (PF) 100 MCG/2ML IJ SOLN
INTRAMUSCULAR | Status: DC | PRN
  Administered 2018-01-04 (×2): 50 ug via INTRAVENOUS

## 2018-01-04 MED ORDER — FENTANYL CITRATE (PF) 250 MCG/5ML IJ SOLN
INTRAMUSCULAR | Status: AC
  Filled 2018-01-04: qty 5

## 2018-01-04 MED ORDER — OXYCODONE-ACETAMINOPHEN 10-325 MG PO TABS: 1.0000 | ORAL_TABLET | ORAL | 0 refills | Status: DC | PRN

## 2018-01-04 MED ORDER — LIDOCAINE 2% (20 MG/ML) 5 ML SYRINGE
INTRAMUSCULAR | Status: AC
  Filled 2018-01-04: qty 5

## 2018-01-04 MED ORDER — DEXAMETHASONE SODIUM PHOSPHATE 4 MG/ML IJ SOLN
INTRAMUSCULAR | Status: DC | PRN
  Administered 2018-01-04: 4 mg via INTRAVENOUS

## 2018-01-04 MED ORDER — VANCOMYCIN HCL 500 MG IV SOLR
INTRAVENOUS | Status: AC
  Filled 2018-01-04: qty 500

## 2018-01-04 MED ORDER — VANCOMYCIN HCL 1000 MG IV SOLR
INTRAVENOUS | Status: DC | PRN
  Administered 2018-01-04: 1000 mg

## 2018-01-04 MED ORDER — EPHEDRINE 5 MG/ML INJ
INTRAVENOUS | Status: AC
  Filled 2018-01-04: qty 10

## 2018-01-04 MED ORDER — PROPOFOL 1000 MG/100ML IV EMUL
INTRAVENOUS | Status: AC
  Filled 2018-01-04: qty 100

## 2018-01-04 SURGICAL SUPPLY — 43 items
BANDAGE ACE 4X5 VEL STRL LF (GAUZE/BANDAGES/DRESSINGS) ×4 IMPLANT
BANDAGE ELASTIC 4 VELCRO ST LF (GAUZE/BANDAGES/DRESSINGS) ×2 IMPLANT
BLADE LONG MED 31MMX9MM (MISCELLANEOUS) ×1
BLADE LONG MED 31X9 (MISCELLANEOUS) ×1 IMPLANT
BNDG CMPR 9X4 STRL LF SNTH (GAUZE/BANDAGES/DRESSINGS) ×2
BNDG ESMARK 4X9 LF (GAUZE/BANDAGES/DRESSINGS) ×2 IMPLANT
BNDG GAUZE ELAST 4 BULKY (GAUZE/BANDAGES/DRESSINGS) ×4 IMPLANT
CONT SPEC 4OZ CLIKSEAL STRL BL (MISCELLANEOUS) ×4 IMPLANT
COVER SURGICAL LIGHT HANDLE (MISCELLANEOUS) ×4 IMPLANT
COVER WAND RF STERILE (DRAPES) ×4 IMPLANT
CUFF TOURN SGL QUICK 18X4 (TOURNIQUET CUFF) ×2 IMPLANT
DRSG EMULSION OIL 3X3 NADH (GAUZE/BANDAGES/DRESSINGS) ×4 IMPLANT
DRSG KUZMA FLUFF (GAUZE/BANDAGES/DRESSINGS) ×2 IMPLANT
ELECT CAUTERY BLADE 6.4 (BLADE) ×4 IMPLANT
ELECT REM PT RETURN 9FT ADLT (ELECTROSURGICAL) ×4
ELECTRODE REM PT RTRN 9FT ADLT (ELECTROSURGICAL) ×2 IMPLANT
GAUZE SPONGE 4X4 12PLY STRL (GAUZE/BANDAGES/DRESSINGS) ×4 IMPLANT
GAUZE SPONGE 4X4 12PLY STRL LF (GAUZE/BANDAGES/DRESSINGS) ×2 IMPLANT
GAUZE XEROFORM 1X8 LF (GAUZE/BANDAGES/DRESSINGS) ×2 IMPLANT
GLOVE BIO SURGEON STRL SZ7.5 (GLOVE) ×4 IMPLANT
GLOVE BIOGEL PI IND STRL 8 (GLOVE) ×2 IMPLANT
GLOVE BIOGEL PI INDICATOR 8 (GLOVE) ×2
GOWN STRL REUS W/ TWL LRG LVL3 (GOWN DISPOSABLE) ×2 IMPLANT
GOWN STRL REUS W/ TWL XL LVL3 (GOWN DISPOSABLE) ×2 IMPLANT
GOWN STRL REUS W/TWL LRG LVL3 (GOWN DISPOSABLE) ×4
GOWN STRL REUS W/TWL XL LVL3 (GOWN DISPOSABLE) ×4
KIT BASIN OR (CUSTOM PROCEDURE TRAY) ×4 IMPLANT
KIT ESSENTIALS PG (KITS) ×2 IMPLANT
KIT TURNOVER KIT B (KITS) ×4 IMPLANT
NDL HYPO 25GX1X1/2 BEV (NEEDLE) ×2 IMPLANT
NEEDLE HYPO 25GX1X1/2 BEV (NEEDLE) ×4 IMPLANT
NS IRRIG 1000ML POUR BTL (IV SOLUTION) ×4 IMPLANT
PACK ORTHO EXTREMITY (CUSTOM PROCEDURE TRAY) ×4 IMPLANT
PAD ARMBOARD 7.5X6 YLW CONV (MISCELLANEOUS) ×4 IMPLANT
PROBE DEBRIDE SONICVAC MISONIX (TIP) ×2 IMPLANT
SOL PREP POV-IOD 4OZ 10% (MISCELLANEOUS) ×4 IMPLANT
SUT ETHILON 3 0 PS 1 (SUTURE) ×4 IMPLANT
SYR CONTROL 10ML LL (SYRINGE) ×4 IMPLANT
TOWEL OR 17X26 10 PK STRL BLUE (TOWEL DISPOSABLE) ×4 IMPLANT
TUBE CONNECTING 12'X1/4 (SUCTIONS) ×1
TUBE CONNECTING 12X1/4 (SUCTIONS) ×3 IMPLANT
TUBE IRRIGATION SET MISONIX (TUBING) ×2 IMPLANT
YANKAUER SUCT BULB TIP NO VENT (SUCTIONS) ×4 IMPLANT

## 2018-01-04 NOTE — H&P (View-Only) (Signed)
  Subjective:  Patient ID: [name], male    DOB: 13-Sep-1970,  MRN: [id number]  Chief Complaint  Patient presents with  . Wound Check    left foot follow up; pt stated, "doing alright; just following up before surgery on Monday"   47 y.o. male presents for wound care. Here for surgical planning visit for next week's surgery.  Review of Systems: Negative except as noted in the HPI. Denies N/V/F/Ch.  Past Medical History:  Diagnosis Date  . Atherosclerosis    Right lower extermity with  ulceration  . CKD (chronic kidney disease), stage III [facility])    nephrologist-  dr [name] ([facility] in [place])    . Diabetic foot ulcer with osteomyelitis ([place])    right lateral foot--- s/p  amputation 4th and 5th ray , ACELL , Wound VAC  . Diabetic peripheral neuropathy ([place])   . Elevated LFTs   . Foot ulcer ([place])    left foot  . Hypertension   . Iron deficiency anemia   . Mixed hyperlipidemia   . Obesity (BMI 30-39.9)   . 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 Type 2 diabetes mellitus treated with insulin ([place])    last A1c 6.2 on 06/02/2017  . Vitamin B 12 deficiency    No current outpatient medications on file.  Social History   Tobacco Use  Smoking Status Never Smoker  Smokeless Tobacco Never Used    No Known Allergies Objective:  There were no vitals filed for this visit. There is no height or weight on file to calculate BMI. Constitutional Well developed. Well nourished.  Vascular Dorsalis pedis pulses palpable bilaterally. Posterior tibial pulses palpable bilaterally. Capillary refill normal to all digits.  No cyanosis or clubbing noted. Pedal hair growth  normal.  Neurologic Normal speech. Oriented to person, place, and time. Protective sensation absent  Dermatologic Wound Location: L foot 5th MPJ Wound Base: Granular/Healthy Peri-wound: Macerated, Calloused Exudate: Scant/small amount Serosanguinous exudate Wound Measurements: -4x3  Orthopedic: No pain to palpation either foot.   Radiographs: No XR today. Assessment:   1. Diabetic ulcer of left midfoot associated with diabetes mellitus due to underlying condition, limited to breakdown of skin ([place])   2. Bone erosion   3. Metatarsal deformity, left   4. Obesity, Class II, BMI 35-39.9   5. Chronic kidney disease (CKD), stage III (moderate) ([facility])   6. Type II diabetes mellitus with neurological manifestations ([place])   7. History of diabetes-related lower limb amputation ([place])    Plan:  Patient was evaluated and treated and all questions answered.  Ulcer Left foot -Reviewed surgical plan -Dressed with DSD. -Continue off-loading with surgical shoe. -Boot dispensed for post-op use.  -Plan for Left 5th Metatarsal Head Resection, Bone Biopsy, Wound Debridement and possible closure. All risks, benefits and alternatives discussed with patient. No guarantees given.  25 minutes of face to face time were spent with the patient. >50% of this was spent on counseling and coordination of care. Specifically discussed with patient the surgical plan, all questions by both him and his wife answered.  No follow-ups on file.

## 2018-01-04 NOTE — Brief Op Note (Signed)
01/04/2018  8:14 AM  PATIENT:  [name]  47 y.o. male  PRE-OPERATIVE DIAGNOSIS:  left foot  POST-OPERATIVE DIAGNOSIS:  dIABETIC ULCER LEFT FOOT  PROCEDURE:  Procedure(s): LEFT FOOT BONE BIOPSY (Left) 5TH METATARSAL RESECTION,  WOUND CLOSURE,  Adjacent Tissue Transfer. (Left)  SURGEON:  Surgeon(s) and Role:    * [name], DPM - Primary  PHYSICIAN ASSISTANT:   ASSISTANTS: none   ANESTHESIA:   local and MAC  EBL:  10 ml  BLOOD ADMINISTERED:none  DRAINS: none   LOCAL MEDICATIONS USED:  MARCAINE    and Amount: 10 ml  SPECIMEN:   ID Type Source Tests Collected by Time Destination  1 : Bone Biopsy Left 5th Metatarsal Tissue Bone SURGICAL PATHOLOGY [name], DPM 01/04/2018 0751   2 : Bone biopsy Tissue Bone SURGICAL PATHOLOGY [name], DPM 01/04/2018 0754   A : soft tissue culture left foot Tissue Bone GRAM STAIN, AEROBIC/ANAEROBIC CULTURE (SURGICAL/DEEP WOUND) [name], DPM 01/04/2018 0756     DISPOSITION OF SPECIMEN:  path and micro  COUNTS:  YES  TOURNIQUET:   Total Tourniquet Time Documented: Calf (Left) - 29 minutes Total: Calf (Left) - 29 minutes   DICTATION: .Note written in EPIC  PLAN OF CARE: Discharge to home after PACU  PATIENT DISPOSITION:  PACU - hemodynamically stable.   Delay start of Pharmacological VTE agent (>24hrs) due to surgical blood loss or risk of bleeding: not applicable

## 2018-01-04 NOTE — Transfer of Care (Signed)
Immediate Anesthesia Transfer of Care Note  Patient: [name]  Procedure(s) Performed: LEFT FOOT BONE BIOPSY (Left ) 5TH METATARSAL RESECTION,  WOUND CLOSURE,  Adjacent Tissue Transfer. (Left Foot)  Patient Location: PACU  Anesthesia Type:MAC  Level of Consciousness: awake, alert , oriented and patient cooperative  Airway & Oxygen Therapy: Patient Spontanous Breathing  Post-op Assessment: Report given to RN, Post -op Vital signs reviewed and stable and Patient moving all extremities X 4  Post vital signs: Reviewed and stable  Last Vitals:  Vitals Value Taken Time  BP    Temp    Pulse 73 01/04/2018  8:19 AM  Resp 10 01/04/2018  8:19 AM  SpO2 100 % 01/04/2018  8:19 AM  Vitals shown include unvalidated device data.  Last Pain:  Vitals:   01/04/18 0634  TempSrc:   PainSc: 0-No pain      Patients Stated Pain Goal: 8 ([id number])  Complications: No apparent anesthesia complications

## 2018-01-04 NOTE — Anesthesia Procedure Notes (Signed)
Procedure Name: MAC Date/Time: 01/04/2018 7:42 AM Performed by: Orlie Dakin, CRNA Pre-anesthesia Checklist: Patient identified, Emergency Drugs available, Suction available and Patient being monitored Patient Re-evaluated:Patient Re-evaluated prior to induction Oxygen Delivery Method: Simple face mask Preoxygenation: Pre-oxygenation with 100% oxygen

## 2018-01-04 NOTE — Anesthesia Preprocedure Evaluation (Signed)
Anesthesia Evaluation  Patient identified by MRN, date of birth, ID band Patient awake    Reviewed: Allergy & Precautions, NPO status , Patient's Chart, lab work & pertinent test results  Airway Mallampati: II  TM Distance: >3 FB     Dental   Pulmonary    breath sounds clear to auscultation       Cardiovascular hypertension,  Rhythm:Regular Rate:Normal     Neuro/Psych  Neuromuscular disease    GI/Hepatic negative GI ROS, Neg liver ROS,   Endo/Other  diabetes  Renal/GU Renal disease     Musculoskeletal   Abdominal   Peds  Hematology  (+) anemia ,   Anesthesia Other Findings   Reproductive/Obstetrics                             Anesthesia Physical Anesthesia Plan  ASA: III  Anesthesia Plan: MAC   Post-op Pain Management:    Induction: Intravenous  PONV Risk Score and Plan: 1 and Treatment may vary due to age or medical condition, Midazolam, Ondansetron and Dexamethasone  Airway Management Planned: Simple Face Mask and Nasal Cannula  Additional Equipment:   Intra-op Plan:   Post-operative Plan:   Informed Consent: I have reviewed the patients History and Physical, chart, labs and discussed the procedure including the risks, benefits and alternatives for the proposed anesthesia with the patient or authorized representative who has indicated his/her understanding and acceptance.   Dental advisory given  Plan Discussed with: CRNA and Anesthesiologist  Anesthesia Plan Comments:         Anesthesia Quick Evaluation

## 2018-01-04 NOTE — Interval H&P Note (Signed)
History and Physical Interval Note:  01/04/2018 7:02 AM  [name] [name]  has presented today for surgery, with the diagnosis of left foot  The various methods of treatment have been discussed with the patient and family. After consideration of risks, benefits and other options for treatment, the patient has consented to  Procedure(s): LEFT FOOT BONE BIOPSY (Left) 5TH METATARSAL RESECTION,POSSIBLE WOUND CLOSURE (Left) as a surgical intervention .  The patient's history has been reviewed, patient examined, no change in status, stable for surgery.  I have reviewed the patient's chart and labs.  Questions were answered to the patient's satisfaction.     [name]

## 2018-01-04 NOTE — Progress Notes (Signed)
  Subjective:  Patient ID: [name], male    DOB: 30-May-1970,  MRN: [id number]  Chief Complaint  Patient presents with  . Wound Check    left foot follow up; pt stated, "doing alright; just following up before surgery on Monday"   47 y.o. male presents for wound care. Here for surgical planning visit for next week's surgery.  Review of Systems: Negative except as noted in the HPI. Denies N/V/F/Ch.  Past Medical History:  Diagnosis Date  . Atherosclerosis    Right lower extermity with  ulceration  . CKD (chronic kidney disease), stage III [facility])    nephrologist-  dr [name] ([facility] in [place])    . Diabetic foot ulcer with osteomyelitis ([place])    right lateral foot--- s/p  amputation 4th and 5th ray , ACELL , Wound VAC  . Diabetic peripheral neuropathy ([place])   . Elevated LFTs   . Foot ulcer ([place])    left foot  . Hypertension   . Iron deficiency anemia   . Mixed hyperlipidemia   . Obesity (BMI 30-39.9)   . 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 Type 2 diabetes mellitus treated with insulin ([place])    last A1c 6.2 on 06/02/2017  . Vitamin B 12 deficiency    No current outpatient medications on file.  Social History   Tobacco Use  Smoking Status Never Smoker  Smokeless Tobacco Never Used    No Known Allergies Objective:  There were no vitals filed for this visit. There is no height or weight on file to calculate BMI. Constitutional Well developed. Well nourished.  Vascular Dorsalis pedis pulses palpable bilaterally. Posterior tibial pulses palpable bilaterally. Capillary refill normal to all digits.  No cyanosis or clubbing noted. Pedal hair growth  normal.  Neurologic Normal speech. Oriented to person, place, and time. Protective sensation absent  Dermatologic Wound Location: L foot 5th MPJ Wound Base: Granular/Healthy Peri-wound: Macerated, Calloused Exudate: Scant/small amount Serosanguinous exudate Wound Measurements: -4x3  Orthopedic: No pain to palpation either foot.   Radiographs: No XR today. Assessment:   1. Diabetic ulcer of left midfoot associated with diabetes mellitus due to underlying condition, limited to breakdown of skin ([place])   2. Bone erosion   3. Metatarsal deformity, left   4. Obesity, Class II, BMI 35-39.9   5. Chronic kidney disease (CKD), stage III (moderate) (HCC)   6. Type II diabetes mellitus with neurological manifestations ([place])   7. History of diabetes-related lower limb amputation ([place])    Plan:  Patient was evaluated and treated and all questions answered.  Ulcer Left foot -Reviewed surgical plan -Dressed with DSD. -Continue off-loading with surgical shoe. -Boot dispensed for post-op use.  -Plan for Left 5th Metatarsal Head Resection, Bone Biopsy, Wound Debridement and possible closure. All risks, benefits and alternatives discussed with patient. No guarantees given.  25 minutes of face to face time were spent with the patient. >50% of this was spent on counseling and coordination of care. Specifically discussed with patient the surgical plan, all questions by both him and his wife answered.  No follow-ups on file.

## 2018-01-05 ENCOUNTER — Telehealth: Payer: Self-pay | Admitting: Family Medicine

## 2018-01-05 NOTE — Telephone Encounter (Signed)
Pt stated that he received a denial letter form his insurance company for the Lantus. The letter stated that his insurance will no longer pay for the Lantus. Pt is concerned that he will run out before his OV.  Pt asking if there is a substitution for Lantus.

## 2018-01-05 NOTE — Telephone Encounter (Signed)
Called pt and left VM to call the office.  

## 2018-01-05 NOTE — Telephone Encounter (Signed)
Pt given results per notes of Dr Yong Channel on 01/01/18. Unable to document in result note due to result note not being routed to [facility].

## 2018-01-05 NOTE — Telephone Encounter (Signed)
See note

## 2018-01-05 NOTE — Anesthesia Postprocedure Evaluation (Signed)
Anesthesia Post Note  Patient: [name]  Procedure(s) Performed: LEFT FOOT BONE BIOPSY (Left ) 5TH METATARSAL RESECTION,  WOUND CLOSURE,  Adjacent Tissue Transfer. (Left Foot)     Patient location during evaluation: PACU Anesthesia Type: MAC Level of consciousness: awake Pain management: pain level controlled Vital Signs Assessment: post-procedure vital signs reviewed and stable Respiratory status: spontaneous breathing Cardiovascular status: stable Anesthetic complications: no    Last Vitals:  Vitals:   01/04/18 0849 01/04/18 0902  BP: 120/70 126/81  Pulse: 68 64  Resp: 18 18  Temp: (!) 36.4 C   SpO2: 100% 100%    Last Pain:  Vitals:   01/04/18 0845  TempSrc:   PainSc: 0-No pain                 Leslye Puccini

## 2018-01-06 ENCOUNTER — Encounter: Payer: Self-pay | Admitting: Podiatry

## 2018-01-06 ENCOUNTER — Encounter (HOSPITAL_COMMUNITY): Payer: Self-pay | Admitting: Podiatry

## 2018-01-06 MED ORDER — INSULIN DEGLUDEC 100 UNIT/ML ~~LOC~~ SOLN: 20.0000 [IU] | Freq: Every day | SUBCUTANEOUS | 1 refills | Status: DC

## 2018-01-06 NOTE — Telephone Encounter (Signed)
Rx sent to pharmacy for Tresiba, inj 20 units at bedtime. Requested pharmacy place rx on file until pt calls for refill, this will replace Lantus.

## 2018-01-06 NOTE — Telephone Encounter (Signed)
Spoke with pt and he is agreeable to starting on Tresiba. He just picked up rx for Lantus so he doesn't need rx right now.

## 2018-01-06 NOTE — Addendum Note (Signed)
Addended by: Jasper Loser on: 01/06/2018 01:39 PM   Modules accepted: Orders

## 2018-01-06 NOTE — Telephone Encounter (Signed)
See telephone from 12/29/17.

## 2018-01-07 ENCOUNTER — Ambulatory Visit: Payer: BLUE CROSS/BLUE SHIELD | Admitting: Podiatry

## 2018-01-07 ENCOUNTER — Ambulatory Visit (INDEPENDENT_AMBULATORY_CARE_PROVIDER_SITE_OTHER): Payer: Self-pay | Admitting: Podiatry

## 2018-01-07 DIAGNOSIS — E08621 Diabetes mellitus due to underlying condition with foot ulcer: Secondary | ICD-10-CM

## 2018-01-07 DIAGNOSIS — L97421 Non-pressure chronic ulcer of left heel and midfoot limited to breakdown of skin: Secondary | ICD-10-CM

## 2018-01-09 LAB — AEROBIC/ANAEROBIC CULTURE W GRAM STAIN (SURGICAL/DEEP WOUND)

## 2018-01-09 LAB — AEROBIC/ANAEROBIC CULTURE (SURGICAL/DEEP WOUND): Culture: NO GROWTH

## 2018-01-11 DIAGNOSIS — M79676 Pain in unspecified toe(s): Secondary | ICD-10-CM

## 2018-01-11 NOTE — Op Note (Signed)
Patient Name: [name] DOB: [birth year]  MRN: [id number]   Date of Service: 01/04/18   Surgeon: Dr. Hardie Pulley, DPM Assistants: None Pre-operative Diagnosis: Left Foot Ulcer with Necrosis of Bone Post-operative Diagnosis: Same Procedures:             1) Metarsectomy L 5th Metatarsal  2) Adjacent Tissue Transfer  3) Intermediate Wound Closure  4) Bone Biopsy L 5th Metatarsal Pathology/Specimens: ID Type Source Tests Collected by Time Destination  1 : Bone Biopsy Left 5th Metatarsal Tissue Bone SURGICAL PATHOLOGY [name], DPM 01/04/2018 0751   2 : Bone biopsy Tissue Bone SURGICAL PATHOLOGY [name], DPM 01/04/2018 0754   A : soft tissue culture left foot Tissue Bone GRAM STAIN (Canceled), AEROBIC/ANAEROBIC CULTURE (SURGICAL/DEEP WOUND) [name], DPM 01/04/2018 0756    Anesthesia: MAC/local Hemostasis: Anatomic Estimated Blood Loss: 10 ml Materials: None Medications: 1g Vancomycin Powder Complications: None  Indications for Procedure:  This is a 47 y.o. male with a chronic ulcer to the left fifth metatarsal area there was concerns that the ulcer was leading to bone erosion and for this reason discussed the patient should benefit from excision of the bone and wound closure.  All risk benefits alternatives of surgery discussed the patient no guarantees given   Procedure in Detail: Patient was identified in pre-operative holding area. Formal consent was signed and the left lower extremity was marked. Patient was brought back to the operating room. Anesthesia was induced.   The extremity was prepped and draped in the usual sterile fashion. Timeout was taken to confirm patient name, laterality, and procedure prior to incision. Attention was then directed to the left foot.  Attention was directed to the plantar aspect of the wound through the wound a rongeur was used to biopsy the bone to evaluate for osteomyelitis.  This was collected and sent for  pathology  Attention was then directed to the dorsolateral aspect of foot where a linear incision was made overlying the fifth metatarsal.  Dissection was carried down through skin subcu tissue with care to avoid all neurovascular structures all bleeders were cauterized electrocautery.  Dissection was carried down to level of the metatarsal phalangeal joint capsule linear incision was performed in the capsule.  The periosteum was gently freed from the bone.  An osteotome was used to to resect the metatarsal with a dorsal distal to plantar proximal orientation of the cut.  The metatarsal head was excised and collected for pathology.  The wound was then copiously irrigated.  1 g vancomycin powder was applied to the wound.  Attention was then directed the plantar side of the wound where there was a granular wound base measuring 4 x 3.  The wound was sharply excised with a 15 blade.  The wound edges were attempted to be brought together however they could not be coapted together without significant deficit. The deficit was 4x4. For this reason a local V to Y skin plasty was performed to allow for additional closure of the wound. The wound was then closed with 3-0 nylon simple and retention suture and skin staples.    The foot was then dressed with xeroform, 4x4, kerlix, and ACE bandage. Patient tolerated the procedure well.   Disposition: Following a period of post-operative monitoring, patient will be transferred back home.

## 2018-01-14 ENCOUNTER — Ambulatory Visit (INDEPENDENT_AMBULATORY_CARE_PROVIDER_SITE_OTHER): Payer: Self-pay | Admitting: Podiatry

## 2018-01-14 DIAGNOSIS — E11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ical complication: Secondary | ICD-10-CM

## 2018-01-14 DIAGNOSIS — L97421 Non-pressure chronic ulcer of left heel and midfoot limited to breakdown of skin: Secondary | ICD-10-CM

## 2018-01-14 DIAGNOSIS — M21962 Unspecified acquired deformity of left lower leg: Secondary | ICD-10-CM

## 2018-01-14 DIAGNOSIS — E08621 Diabetes mellitus due to underlying condition with foot ulcer: Secondary | ICD-10-CM

## 2018-01-14 LAB — HM DIABETES EYE EXAM

## 2018-01-14 NOTE — Progress Notes (Signed)
.    This patient presents the office for an evaluation of his left foot following surgery by Dr. March Rummage surgery was performed on December 30, 2017.  He presents the office today for his second postoperative visit.  He was treated with bone biopsy of the fifth metatarsal left foot as well as the metatarsal head resection fifth metatarsal left foot.  Patient states that he feels he is doing great.  Patient previously has been diagnosed with diabetic neuropathy with an amputation of the fourth and fifth digits right foot.  He presents the office today for an evaluation of his left foot after surgery.  Objective examination of the fifth MPJ left foot reveals staples intact dorsally as well as having Steri-Strips noted at the level of the MPJ.  Plantarly there are multiple retention sutures holding the skin edges together.  There is healthy granulation tissue noted at the site of the ulcer.  No evidence of any drainage or infection noted.  S/P left foot surgery  POV # 2.  Examination of the surgical site reveals good collapsing noted dorsolaterally at the fifth MPJ.  Healthy granulation tissue is noted plantarly sub-fifth MPJ.  At this juncture I proceeded to ask Dr. March Rummage to check on his surgery.  He was very pleased with the progress of the healing sub-fifth metatarsal left foot and instructed me to reapply a wet Betadine bandage and have him return in 1 week.  He is to ambulate with this bandage intact inside a cam walker.  Patient to call the office as needed.  Gardiner Barefoot DPM

## 2018-01-15 ENCOUNTER — Telehealth: Payer: Self-pay | Admitting: Podiatry

## 2018-01-21 ENCOUNTER — Ambulatory Visit (INDEPENDENT_AMBULATORY_CARE_PROVIDER_SITE_OTHER): Payer: BLUE CROSS/BLUE SHIELD | Admitting: Podiatry

## 2018-01-21 ENCOUNTER — Encounter: Payer: BLUE CROSS/BLUE SHIELD | Admitting: Podiatry

## 2018-01-21 DIAGNOSIS — L97421 Non-pressure chronic ulcer of left heel and midfoot limited to breakdown of skin: Secondary | ICD-10-CM

## 2018-01-21 DIAGNOSIS — E08621 Diabetes mellitus due to underlying condition with foot ulcer: Secondary | ICD-10-CM

## 2018-01-21 MED ORDER — SILVER SULFADIAZINE 1 % EX CREA: TOPICAL_CREAM | CUTANEOUS | 0 refills | Status: DC

## 2018-01-21 NOTE — Progress Notes (Signed)
Subjective:  Patient ID: [name], male    DOB: 10-10-1970,  MRN: [id number]  Chief Complaint  Patient presents with  . Post-op Problem    pov#3 dos 12.11.2019 Bone Biopsy Lt, Metatarsal Resection Lt, Poss. Wound Closure Lt -    DOS: 12/30/17 Procedure: Left 5th Metatarsal Resection, Wound closure  48 y.o. male returns for post-op check. Doing well denies post-op issues.  Review of Systems: Negative except as noted in the HPI. Denies N/V/F/Ch.  Past Medical History:  Diagnosis Date  . Atherosclerosis    Right lower extermity with  ulceration  . CKD (chronic kidney disease), stage III [facility])    nephrologist-  dr [name] ([facility] in [place])    . Diabetic foot ulcer with osteomyelitis ([place])    right lateral foot--- s/p  amputation 4th and 5th ray , ACELL , Wound VAC  . Diabetic peripheral neuropathy ([place])   . Elevated LFTs   . Foot ulcer ([place])    left foot  . Hypertension   . Iron deficiency anemia   . Mixed hyperlipidemia   . Obesity (BMI 30-39.9)   . 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 Type 2 diabetes mellitus treated with insulin ([place])    last A1c 6.2 on 06/02/2017  . Vitamin B 12 deficiency     Current Outpatient Medications:  .  aspirin EC 81 MG tablet, Take 81 mg by mouth daily., Disp: , Rfl:  .  atorvastatin (LIPITOR) 40 MG tablet, Take 1.5 tablets (60 mg total) by mouth every morning. (Patient taking differently: Take 60 mg by mouth daily. In the morning.), Disp: 135 tablet, Rfl: 3 .  clindamycin (CLEOCIN) 150 MG capsule, Take 1 capsule (150 mg total) by mouth 2 (two) times daily., Disp: 14 capsule, Rfl: 0 .  Cyanocobalamin (VITAMIN B-12) 5000 MCG  SUBL, Place 5,000 mcg under the tongue daily., Disp: , Rfl:  .  feeding supplement, GLUCERNA SHAKE, (GLUCERNA SHAKE) LIQD, Take 237 mLs by mouth 2 (two) times daily between meals., Disp: 180 Can, Rfl: 3 .  fenofibrate (TRICOR) 48 MG tablet, Take 1 tablet (48 mg total) by mouth daily. (Patient taking differently: Take 48 mg by mouth daily. In the morning.), Disp: 90 tablet, Rfl: 3 .  ferrous sulfate 325 (65 FE) MG tablet, Take 1 tablet (325 mg total) by mouth 2 (two) times daily with a meal., Disp: 60 tablet, Rfl: 3 .  hydrochlorothiazide (MICROZIDE) 12.5 MG capsule, Take 1 capsule (12.5 mg total) by mouth every morning. (Patient taking differently: Take 12.5 mg by mouth daily. In the morning.), Disp: 90 capsule, Rfl: 3 .  Insulin Degludec (TRESIBA) 100 UNIT/ML SOLN, Inject 20 Units into the skin at bedtime., Disp: 30 mL, Rfl: 1 .  insulin lispro (HUMALOG) 100 UNIT/ML injection, Inject 0.05-0.2 mLs (5-20 Units total) into the skin 2 (two) times daily. (Patient taking differently: Inject 5-20 Units into the skin 2 (two) times daily. Sliding Scale Insulin), Disp: 40 mL, Rfl: 3 .  Insulin Syringe-Needle U-100 (BD INSULIN SYRINGE U/F) 31G X 5/16" 0.5 ML MISC, Use to inject insulin 4 times daily, Disp: 100 each, Rfl: 4 .  Multiple Vitamin (MULTIVITAMIN WITH MINERALS) TABS tablet, Take 1 tablet by mouth  2 (two) times daily., Disp: , Rfl:  .  Omega-3 Fatty Acids (FISH OIL) 1360 MG CAPS, Take 1,360 mg by mouth daily. , Disp: , Rfl:  .  oxyCODONE-acetaminophen (PERCOCET) 10-325 MG tablet, Take 1 tablet by mouth every 4 (four) hours as needed for pain., Disp: 20 tablet, Rfl: 0 .  pyridoxine (B-6) 100 MG tablet, Take 100 mg by mouth 2 (two) times daily., Disp: , Rfl:  .  ramipril (ALTACE) 5 MG capsule, Take 1 capsule (5 mg total) by mouth every morning. (Patient taking differently: Take 5 mg by mouth daily. In the morning.), Disp: 90 capsule, Rfl: 3 .  sitaGLIPtin (JANUVIA) 50 MG tablet, Take 1 tablet (50 mg total)  by mouth every morning., Disp: 90 tablet, Rfl: 3  Social History   Tobacco Use  Smoking Status Never Smoker  Smokeless Tobacco Never Used    No Known Allergies Objective:  There were no vitals filed for this visit. There is no height or weight on file to calculate BMI. Constitutional Well developed. Well nourished.  Vascular Foot warm and well perfused. Capillary refill normal to all digits.   Neurologic Normal speech. Oriented to person, place, and time. Epicritic sensation to light touch grossly present bilaterally.  Dermatologic Skin healing well without signs of infection. Skin edges well coapted without signs of infection.  Orthopedic: Tenderness to palpation noted about the surgical site.   Radiographs: None Assessment:   1. Diabetic ulcer of left midfoot associated with diabetes mellitus due to underlying condition, limited to breakdown of skin [facility])    Plan:  Patient was evaluated and treated and all questions answered.  S/p foot surgery left -Progressing as expected post-operatively. -XR: None -WB Status: WBAT in CAM boot -Sutures: intact. -Medications: None refilled today. -Foot redressed. -Silvadene rx sent to pharmacy.  Return in about 1 week (around 01/28/2018) for Post-op, Wound Care.

## 2018-01-25 ENCOUNTER — Encounter: Payer: Self-pay | Admitting: Family Medicine

## 2018-01-25 DIAGNOSIS — E11319 Type 2 diabetes mellitus with unspecified diabetic retinopathy without macular edema: Secondary | ICD-10-CM | POA: Insufficient documentation

## 2018-01-26 ENCOUNTER — Telehealth: Payer: Self-pay | Admitting: Podiatry

## 2018-01-26 NOTE — Telephone Encounter (Signed)
Pt came in to pick up FMLA/Disability paperwork for work and the dates on his paperwork are inaccurate. Please give pt a call. He will be in office on 1/9 for an appt.

## 2018-01-28 ENCOUNTER — Encounter: Payer: BLUE CROSS/BLUE SHIELD | Admitting: Podiatry

## 2018-01-28 ENCOUNTER — Ambulatory Visit (INDEPENDENT_AMBULATORY_CARE_PROVIDER_SITE_OTHER): Payer: Self-pay | Admitting: Podiatry

## 2018-01-28 DIAGNOSIS — L97421 Non-pressure chronic ulcer of left heel and midfoot limited to breakdown of skin: Secondary | ICD-10-CM

## 2018-01-28 DIAGNOSIS — E08621 Diabetes mellitus due to underlying condition with foot ulcer: Secondary | ICD-10-CM

## 2018-01-28 DIAGNOSIS — Z9889 Other specified postprocedural states: Secondary | ICD-10-CM

## 2018-01-29 ENCOUNTER — Encounter: Payer: BLUE CROSS/BLUE SHIELD | Admitting: Podiatry

## 2018-01-29 ENCOUNTER — Ambulatory Visit: Payer: BLUE CROSS/BLUE SHIELD | Admitting: Podiatry

## 2018-01-31 NOTE — Progress Notes (Signed)
Subjective:  Patient ID: [name], male    DOB: 1970/08/05,  MRN: [id number]  Chief Complaint  Patient presents with  . Routine Post Op    pov#4 dos 12.11.2019 Bone Biopsy Lt, Metatarsal Resection Lt, Poss. Wound Closure Lt -Dr. March Rummage    DOS: 12/30/17 Procedure: Left 5th Metatarsal Resection, Wound closure  48 y.o. male returns for post-op check. Doing well denies post-op issues.  Review of Systems: Negative except as noted in the HPI. Denies N/V/F/Ch.  Past Medical History:  Diagnosis Date  . Atherosclerosis    Right lower extermity with  ulceration  . CKD (chronic kidney disease), stage III [facility])    nephrologist-  dr [name] ([facility] in [place])    . Diabetic foot ulcer with osteomyelitis ([place])    right lateral foot--- s/p  amputation 4th and 5th ray , ACELL , Wound VAC  . Diabetic peripheral neuropathy ([place])   . Elevated LFTs   . Foot ulcer ([place])    left foot  . Hypertension   . Iron deficiency anemia   . Mixed hyperlipidemia   . Obesity (BMI 30-39.9)   . 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 Type 2 diabetes mellitus treated with insulin ([place])    last A1c 6.2 on 06/02/2017  . Vitamin B 12 deficiency     Current Outpatient Medications:  .  aspirin EC 81 MG tablet, Take 81 mg by mouth daily., Disp: , Rfl:  .  atorvastatin (LIPITOR) 40 MG tablet, Take 1.5 tablets (60 mg total) by mouth every morning. (Patient taking differently: Take 60 mg by mouth daily. In the morning.), Disp: 135 tablet, Rfl: 3 .  clindamycin (CLEOCIN) 150 MG capsule, Take 1 capsule (150 mg total) by mouth 2 (two) times daily., Disp: 14 capsule, Rfl: 0 .  Cyanocobalamin (VITAMIN B-12)  5000 MCG SUBL, Place 5,000 mcg under the tongue daily., Disp: , Rfl:  .  feeding supplement, GLUCERNA SHAKE, (GLUCERNA SHAKE) LIQD, Take 237 mLs by mouth 2 (two) times daily between meals., Disp: 180 Can, Rfl: 3 .  fenofibrate (TRICOR) 48 MG tablet, Take 1 tablet (48 mg total) by mouth daily. (Patient taking differently: Take 48 mg by mouth daily. In the morning.), Disp: 90 tablet, Rfl: 3 .  ferrous sulfate 325 (65 FE) MG tablet, Take 1 tablet (325 mg total) by mouth 2 (two) times daily with a meal., Disp: 60 tablet, Rfl: 3 .  hydrochlorothiazide (MICROZIDE) 12.5 MG capsule, Take 1 capsule (12.5 mg total) by mouth every morning. (Patient taking differently: Take 12.5 mg by mouth daily. In the morning.), Disp: 90 capsule, Rfl: 3 .  Insulin Degludec (TRESIBA) 100 UNIT/ML SOLN, Inject 20 Units into the skin at bedtime., Disp: 30 mL, Rfl: 1 .  insulin lispro (HUMALOG) 100 UNIT/ML injection, Inject 0.05-0.2 mLs (5-20 Units total) into the skin 2 (two) times daily. (Patient taking differently: Inject 5-20 Units into the skin 2 (two) times daily. Sliding Scale Insulin), Disp: 40 mL, Rfl: 3 .  Insulin Syringe-Needle U-100 (BD INSULIN SYRINGE U/F) 31G X 5/16" 0.5 ML MISC, Use to inject insulin 4 times daily, Disp: 100 each, Rfl: 4 .  Multiple Vitamin (MULTIVITAMIN WITH MINERALS) TABS tablet, Take 1 tablet  by mouth 2 (two) times daily., Disp: , Rfl:  .  Omega-3 Fatty Acids (FISH OIL) 1360 MG CAPS, Take 1,360 mg by mouth daily. , Disp: , Rfl:  .  oxyCODONE-acetaminophen (PERCOCET) 10-325 MG tablet, Take 1 tablet by mouth every 4 (four) hours as needed for pain., Disp: 20 tablet, Rfl: 0 .  pyridoxine (B-6) 100 MG tablet, Take 100 mg by mouth 2 (two) times daily., Disp: , Rfl:  .  ramipril (ALTACE) 5 MG capsule, Take 1 capsule (5 mg total) by mouth every morning. (Patient taking differently: Take 5 mg by mouth daily. In the morning.), Disp: 90 capsule, Rfl: 3 .  silver sulfADIAZINE (SILVADENE) 1 % cream, Apply  pea-sized amount to wound daily., Disp: 50 g, Rfl: 0 .  sitaGLIPtin (JANUVIA) 50 MG tablet, Take 1 tablet (50 mg total) by mouth every morning., Disp: 90 tablet, Rfl: 3  Social History   Tobacco Use  Smoking Status Never Smoker  Smokeless Tobacco Never Used    No Known Allergies Objective:  There were no vitals filed for this visit. There is no height or weight on file to calculate BMI. Constitutional Well developed. Well nourished.  Vascular Foot warm and well perfused. Capillary refill normal to all digits.   Neurologic Normal speech. Oriented to person, place, and time. Epicritic sensation to light touch grossly present bilaterally.  Dermatologic Skin healing well without signs of infection. Skin edges well coapted without signs of infection.  Orthopedic: Tenderness to palpation noted about the surgical site.   Radiographs: None Assessment:   1. Diabetic ulcer of left midfoot associated with diabetes mellitus due to underlying condition, limited to breakdown of skin ([place])   2. Post-operative state    Plan:  Patient was evaluated and treated and all questions answered.  S/p foot surgery left -Progressing as expected post-operatively. -XR: None -WB Status: WBAT in CAM boot -Sutures: intact. -Medications: None refilled today. -Foot redressed.  No follow-ups on file.

## 2018-02-01 ENCOUNTER — Telehealth: Payer: Self-pay | Admitting: Podiatry

## 2018-02-01 NOTE — Telephone Encounter (Signed)
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

## 2018-02-01 NOTE — Telephone Encounter (Signed)
I asked pt how Dr. March Rummage could have pt released to work for the class and to remain non-weight bearing. Pt states paperwork was brought to the office and the dates can be adjusted. I told pt I would give to J. Albert and she would set this up for him.

## 2018-02-01 NOTE — Telephone Encounter (Signed)
I would be fine with that. Please let the patient know and can we write him a letter to that effect?

## 2018-02-04 ENCOUNTER — Ambulatory Visit: Payer: BLUE CROSS/BLUE SHIELD | Admitting: Family Medicine

## 2018-02-04 ENCOUNTER — Encounter: Payer: Self-pay | Admitting: Family Medicine

## 2018-02-04 VITALS — BP 130/86 | HR 66 | Temp 97.9°F | Ht 72.0 in | Wt 270.8 lb

## 2018-02-04 DIAGNOSIS — E785 Hyperlipidemia, unspecified: Secondary | ICD-10-CM | POA: Diagnosis not present

## 2018-02-04 DIAGNOSIS — Z89619 Acquired absence of unspecified leg above knee: Secondary | ICD-10-CM

## 2018-02-04 DIAGNOSIS — I10 Essential (primary) hypertension: Secondary | ICD-10-CM

## 2018-02-04 DIAGNOSIS — E11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ical complication: Secondary | ICD-10-CM | POA: Diagnosis not present

## 2018-02-04 DIAGNOSIS — E11621 Type 2 diabetes mellitus with foot ulcer: Secondary | ICD-10-CM | POA: Diagnosis not present

## 2018-02-04 DIAGNOSIS — E1169 Type 2 diabetes mellitus with other specified complication: Secondary | ICD-10-CM

## 2018-02-04 DIAGNOSIS — M869 Osteomyelitis, unspecified: Secondary | ICD-10-CM

## 2018-02-04 DIAGNOSIS — E1142 Type 2 diabetes mellitus with diabetic polyneuropathy: Secondary | ICD-10-CM | POA: Diagnosis not present

## 2018-02-04 DIAGNOSIS — L97909 Non-pressure chronic ulcer of unspecified part of unspecified lower leg with unspecified severity: Secondary | ICD-10-CM

## 2018-02-04 DIAGNOSIS — I70299 Other atherosclerosis of native arteries of extremities, unspecified extremity: Secondary | ICD-10-CM

## 2018-02-04 DIAGNOSIS — L97509 Non-pressure chronic ulcer of other part of unspecified foot with unspecified severity: Secondary | ICD-10-CM

## 2018-02-04 LAB — POCT GLYCOSYLATED HEMOGLOBIN (HGB A1C): Hemoglobin A1C: 5.6 % (ref 4.0–5.6)

## 2018-02-04 MED ORDER — INSULIN DEGLUDEC 100 UNIT/ML ~~LOC~~ SOLN: 20.0000 [IU] | Freq: Every day | SUBCUTANEOUS | 3 refills | Status: DC

## 2018-02-04 MED ORDER — INSULIN LISPRO 100 UNIT/ML ~~LOC~~ SOLN: 5.0000 [IU] | Freq: Two times a day (BID) | SUBCUTANEOUS | 3 refills | Status: DC

## 2018-02-04 NOTE — Assessment & Plan Note (Addendum)
S: controlled on ramipril 5 mg and hydrochlorothiazide 12.5 mg. Does some checks (I believe through work and usually in 546-503 range systolic with controlled diastolic) BP Readings from Last 3 Encounters:  02/04/18 130/86  01/04/18 126/81  01/01/18 116/80  A/P: We discussed blood pressure goal of <140/90. Continue current meds

## 2018-02-04 NOTE — Progress Notes (Signed)
Subjective:  [name] is a 48 y.o. year old very pleasant male patient who presents for/with See problem oriented charting ROS-no chest pain or shortness of breath.  Denies hypoglycemia.  No headache or blurry vision.  Past Medical History-  Patient Active Problem List   Diagnosis Date Noted  . Diabetic foot ulcer with osteomyelitis ([place]) 07/29/2016    Priority: High  . Morbid obesity ([place]) 03/08/2015    Priority: High  . Type II diabetes mellitus with neurological manifestations ([place]) 03/07/2015    Priority: High  . Diabetic retinopathy ([place]) 01/25/2018    Priority: Medium  . Chronic kidney disease (CKD), stage III (moderate) ([place]) 11/17/2016    Priority: Medium  . Diabetic neuropathy ([place]) 11/17/2016    Priority: Medium  . Elevated LFTs     Priority: Medium  . Dyslipidemia 03/08/2015    Priority: Medium  . HTN (hypertension) 03/08/2015    Priority: Medium  . Obesity, Class II, BMI 35-39.9 06/02/2017    Priority: Low  . Status post split thickness skin graft 12/08/2016    Priority: Low  . B12 deficiency 11/17/2016    Priority: Low  . Iron deficiency anemia 11/17/2016    Priority: Low  . 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 Atherosclerosis of artery of extremity with ulceration ([place]) 02/04/2018    Medications- reviewed and updated Current Outpatient Medications  Medication Sig Dispense Refill  . aspirin EC 81 MG tablet Take 81 mg by mouth daily.    Marland Kitchen atorvastatin (LIPITOR) 40 MG tablet Take 1.5 tablets (60 mg total) by mouth every morning. (Patient taking differently: Take 60 mg by mouth daily. In the morning.) 135 tablet 3  . Cyanocobalamin (VITAMIN B-12) 5000 MCG SUBL Place 5,000 mcg under the tongue daily.    . feeding supplement, GLUCERNA SHAKE, (GLUCERNA SHAKE) LIQD Take 237 mLs by mouth 2 (two)  times daily between meals. 180 Can 3  . fenofibrate (TRICOR) 48 MG tablet Take 1 tablet (48 mg total) by mouth daily. (Patient taking differently: Take 48 mg by mouth daily. In the morning.) 90 tablet 3  . ferrous sulfate 325 (65 FE) MG tablet Take 1 tablet (325 mg total) by mouth 2 (two) times daily with a meal. 60 tablet 3  . hydrochlorothiazide (MICROZIDE) 12.5 MG capsule Take 1 capsule (12.5 mg total) by mouth every morning. (Patient taking differently: Take 12.5 mg by mouth daily. In the morning.) 90 capsule 3  . Insulin Degludec (TRESIBA) 100 UNIT/ML SOLN Inject 20 Units into the skin at bedtime. 30 mL 1  . insulin lispro (HUMALOG) 100 UNIT/ML injection Inject 0.05-0.2 mLs (5-20 Units total) into the skin 2 (two) times daily. (Patient taking differently: Inject 5-20 Units into the skin 2 (two) times daily. Sliding Scale Insulin) 40 mL 3  . Insulin Syringe-Needle U-100 (BD INSULIN SYRINGE U/F) 31G X 5/16" 0.5 ML MISC Use to inject insulin 4 times daily 100 each 4  . Multiple Vitamin (MULTIVITAMIN WITH MINERALS) TABS tablet Take 1 tablet by mouth 2 (two) times daily.    . Omega-3 Fatty Acids (FISH OIL) 1360 MG CAPS Take 1,360 mg by mouth daily.     Marland Kitchen pyridoxine (B-6) 100 MG tablet Take 100 mg by mouth 2 (two) times daily.    . ramipril (ALTACE) 5 MG capsule  Take 1 capsule (5 mg total) by mouth every morning. (Patient taking differently: Take 5 mg by mouth daily. In the morning.) 90 capsule 3  . silver sulfADIAZINE (SILVADENE) 1 % cream Apply pea-sized amount to wound daily. 50 g 0  . sitaGLIPtin (JANUVIA) 50 MG tablet Take 1 tablet (50 mg total) by mouth every morning. 90 tablet 3   Objective: BP 130/86   Pulse 66   Temp 97.9 F (36.6 C) (Oral)   Ht 6' (1.829 m)   Wt 270 lb 12.8 oz (122.8 kg)   SpO2 100%   BMI 36.73 kg/m  Gen: NAD, resting comfortably CV: RRR no murmurs rubs or gallops Lungs: CTAB no crackles, wheeze, rhonchi Ext: no edema Skin: warm, dry, he recently dressed left  foot post recent surgery and right foot with chronic ulceration- deferred exam today  Assessment/Plan:  Type II diabetes mellitus with neurological manifestations (HCC) S:  controlled on Lantus 20 units-transitioning to [place] for insurance-and Humalog typically 15 units twice a day.  Also takes Januvia.  See prior notes on metformin. CBGs- denies low blood sugars.  Exercise and diet- exercise limited by foot- still doing some walking- wife had surgery for plantar fasciitis Lab Results  Component Value Date   HGBA1C 5.6 02/04/2018   HGBA1C 6.6 (H) 10/06/2017   HGBA1C 6.2 06/02/2017   A/P: controlled- continue current meds  HTN (hypertension) S: controlled on ramipril 5 mg and hydrochlorothiazide 12.5 mg. Does some checks (I believe through work and usually in 811-914 range systolic with controlled diastolic) BP Readings from Last 3 Encounters:  02/04/18 130/86  01/04/18 126/81  01/01/18 116/80  A/P: We discussed blood pressure goal of <140/90. Continue current meds  Diabetic neuropathy ([place]) Patient with diabetic neuropathy as well as left diabetic ulcer- does not take medicine for this but this is a strong contributing factor to ulcerations he has developed- still working with [facility] to heal right foot.   Dyslipidemia S: controlled on atorvastatin 60mg  and tricor 40mg  Lab Results  Component Value Date   CHOL 126 01/01/2018   HDL 41.70 01/01/2018   LDLCALC 71 01/01/2018   LDLDIRECT 61.0 02/02/2017   TRIG 66.0 01/01/2018   CHOLHDL 3 01/01/2018   A/P: reasonably controlled- continue current rx    Known history of 5th ray amputation right foot- noted  Morbid obesity- down from over 100 units lantus a day with weight loss- continueing to work on this   Atherosclerosis likely contributed to ulceration of right foot- workign to control risk factors.   Future Appointments  Date Time Provider [place]  02/05/2018 11:00 AM [name], DPM [facility]  02/05/2018 11:30 AM Regal, Tamala Fothergill, DPM [facility]  06/07/2018  3:40 PM Yong Channel Brayton Mars, MD [facility]   Return in about 4 months (around 06/05/2018) for physical.  Lab/Order associations: Diabetic polyneuropathy associated with type 2 diabetes mellitus ([place]) - Plan: POCT glycosylated hemoglobin (Hb A1C)  Essential hypertension  Type II diabetes mellitus with neurological manifestations (HCC)  Dyslipidemia  Diabetic foot ulcer with osteomyelitis ([place])  History of diabetes-related lower limb amputation ([place]), Chronic  Morbid obesity ([place]), Chronic  Atherosclerosis of artery of extremity with ulceration ([place]), Chronic  Meds ordered this encounter  Medications  . Insulin Degludec (TRESIBA) 100 UNIT/ML SOLN    Sig: Inject 20 Units into the skin at bedtime.    Dispense:  30 mL    Refill:  3    Please place on file, this will  replace Lantus when pt runs out.  . insulin lispro (HUMALOG) 100 UNIT/ML injection    Sig: Inject 0.05-0.2 mLs (5-20 Units total) into the skin 2 (two) times daily.    Dispense:  40 mL    Refill:  3   Return precautions advised.  [name], MD

## 2018-02-04 NOTE — Patient Instructions (Addendum)
No changes today- glad you are doing so well  Glad the surgery went well  4 month physical

## 2018-02-04 NOTE — Assessment & Plan Note (Signed)
Patient with diabetic neuropathy as well as left diabetic ulcer- does not take medicine for this but this is a strong contributing factor to ulcerations he has developed- still working with [facility] to heal right foot.

## 2018-02-04 NOTE — Assessment & Plan Note (Signed)
S:  controlled on Lantus 20 units-transitioning to [place] for insurance-and Humalog typically 15 units twice a day.  Also takes Januvia.  See prior notes on metformin. CBGs- denies low blood sugars.  Exercise and diet- exercise limited by foot- still doing some walking- wife had surgery for plantar fasciitis Lab Results  Component Value Date   HGBA1C 5.6 02/04/2018   HGBA1C 6.6 (H) 10/06/2017   HGBA1C 6.2 06/02/2017   A/P: controlled- continue current meds

## 2018-02-04 NOTE — Assessment & Plan Note (Signed)
S: controlled on atorvastatin 60mg  and tricor 40mg  Lab Results  Component Value Date   CHOL 126 01/01/2018   HDL 41.70 01/01/2018   LDLCALC 71 01/01/2018   LDLDIRECT 61.0 02/02/2017   TRIG 66.0 01/01/2018   CHOLHDL 3 01/01/2018   A/P: reasonably controlled- continue current rx

## 2018-02-05 ENCOUNTER — Ambulatory Visit: Payer: BLUE CROSS/BLUE SHIELD | Admitting: Podiatry

## 2018-02-05 ENCOUNTER — Ambulatory Visit (INDEPENDENT_AMBULATORY_CARE_PROVIDER_SITE_OTHER): Payer: BLUE CROSS/BLUE SHIELD

## 2018-02-05 ENCOUNTER — Ambulatory Visit (INDEPENDENT_AMBULATORY_CARE_PROVIDER_SITE_OTHER): Payer: BLUE CROSS/BLUE SHIELD | Admitting: Podiatry

## 2018-02-05 DIAGNOSIS — M21962 Unspecified acquired deformity of left lower leg: Secondary | ICD-10-CM

## 2018-02-05 DIAGNOSIS — L97421 Non-pressure chronic ulcer of left heel and midfoot limited to breakdown of skin: Secondary | ICD-10-CM

## 2018-02-05 DIAGNOSIS — E08621 Diabetes mellitus due to underlying condition with foot ulcer: Secondary | ICD-10-CM | POA: Diagnosis not present

## 2018-02-05 DIAGNOSIS — L97511 Non-pressure chronic ulcer of other part of right foot limited to breakdown of skin: Secondary | ICD-10-CM | POA: Diagnosis not present

## 2018-02-07 NOTE — Progress Notes (Signed)
Subjective:  Patient ID: [name], male    DOB: May 05, 1970,  MRN: [id number]  Chief Complaint  Patient presents with  . Routine Post Op    pov#5 dos 12.11.2019 Bone Biopsy Lt, Metatarsal Resection Lt, Poss. Wound Closure Lt. Pt states he is healing well and has no concerns.     DOS: 12/30/17 Procedure: Left 5th Metatarsal Resection, Wound closure  47 y.o. male returns for post-op check.  Doing well thinks his foot is healing fine  New complaint of a large callus to the right foot thinks he might have an open sore at the area was previously being treated by another doctor.  Review of Systems: Negative except as noted in the HPI. Denies N/V/F/Ch.  Past Medical History:  Diagnosis Date  . Atherosclerosis    Right lower extermity with  ulceration  . CKD (chronic kidney disease), stage III [facility])    nephrologist-  dr [name] ([facility] in [place])    . Diabetic foot ulcer with osteomyelitis ([place])    right lateral foot--- s/p  amputation 4th and 5th ray , ACELL , Wound VAC  . Diabetic peripheral neuropathy ([place])   . Elevated LFTs   . Foot ulcer ([place])    left foot  . Hypertension   . Iron deficiency anemia   . Mixed hyperlipidemia   . Obesity (BMI 30-39.9)   . 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 Type 2 diabetes mellitus treated with insulin ([place])    last A1c 6.2 on 06/02/2017  . Vitamin B 12 deficiency     Current Outpatient Medications:  .  aspirin EC 81 MG tablet, Take 81 mg by mouth daily., Disp: , Rfl:  .  atorvastatin (LIPITOR) 40 MG tablet, Take 1.5 tablets (60 mg total) by mouth every morning. (Patient taking differently: Take 60 mg by mouth daily. In the  morning.), Disp: 135 tablet, Rfl: 3 .  Cyanocobalamin (VITAMIN B-12) 5000 MCG SUBL, Place 5,000 mcg under the tongue daily., Disp: , Rfl:  .  feeding supplement, GLUCERNA SHAKE, (GLUCERNA SHAKE) LIQD, Take 237 mLs by mouth 2 (two) times daily between meals., Disp: 180 Can, Rfl: 3 .  fenofibrate (TRICOR) 48 MG tablet, Take 1 tablet (48 mg total) by mouth daily. (Patient taking differently: Take 48 mg by mouth daily. In the morning.), Disp: 90 tablet, Rfl: 3 .  ferrous sulfate 325 (65 FE) MG tablet, Take 1 tablet (325 mg total) by mouth 2 (two) times daily with a meal., Disp: 60 tablet, Rfl: 3 .  hydrochlorothiazide (MICROZIDE) 12.5 MG capsule, Take 1 capsule (12.5 mg total) by mouth every morning. (Patient taking differently: Take 12.5 mg by mouth daily. In the morning.), Disp: 90 capsule, Rfl: 3 .  Insulin Degludec (TRESIBA) 100 UNIT/ML SOLN, Inject 20 Units into the skin at bedtime., Disp: 30 mL, Rfl: 3 .  insulin lispro (HUMALOG) 100 UNIT/ML injection, Inject 0.05-0.2 mLs (5-20 Units total) into the skin 2 (two) times daily., Disp: 40 mL, Rfl: 3 .  Insulin Syringe-Needle U-100 (BD INSULIN SYRINGE U/F) 31G X 5/16" 0.5 ML MISC, Use to inject insulin 4 times daily, Disp: 100 each, Rfl: 4 .  Multiple Vitamin (MULTIVITAMIN WITH MINERALS) TABS tablet, Take 1  tablet by mouth 2 (two) times daily., Disp: , Rfl:  .  Omega-3 Fatty Acids (FISH OIL) 1360 MG CAPS, Take 1,360 mg by mouth daily. , Disp: , Rfl:  .  pyridoxine (B-6) 100 MG tablet, Take 100 mg by mouth 2 (two) times daily., Disp: , Rfl:  .  ramipril (ALTACE) 5 MG capsule, Take 1 capsule (5 mg total) by mouth every morning. (Patient taking differently: Take 5 mg by mouth daily. In the morning.), Disp: 90 capsule, Rfl: 3 .  silver sulfADIAZINE (SILVADENE) 1 % cream, Apply pea-sized amount to wound daily., Disp: 50 g, Rfl: 0 .  sitaGLIPtin (JANUVIA) 50 MG tablet, Take 1 tablet (50 mg total) by mouth every morning., Disp: 90 tablet, Rfl: 3  Social  History   Tobacco Use  Smoking Status Never Smoker  Smokeless Tobacco Never Used    No Known Allergies Objective:  There were no vitals filed for this visit. There is no height or weight on file to calculate BMI. Constitutional Well developed. Well nourished.  Vascular Foot warm and well perfused. Capillary refill normal to all digits.   Neurologic Normal speech. Oriented to person, place, and time. Epicritic sensation to light touch grossly present bilaterally.  Dermatologic Plantar wound now only a fissured area surgical incision well-healed without dehiscence Large hyperkeratosis right foot with 0.5 x 0.5 cm open area upon debridement over hyperkeratosis is approximately 5 x 3 cm  Orthopedic: Tenderness to palpation noted about the surgical site.   Radiographs: None Assessment:   1. Metatarsal deformity, left   2. Diabetic ulcer of left midfoot associated with diabetes mellitus due to underlying condition, limited to breakdown of skin ([place])   3. Diabetic ulcer of other part of right foot associated with diabetes mellitus due to underlying condition, limited to breakdown of skin [facility])    Plan:  Patient was evaluated and treated and all questions answered.  S/p foot surgery left -Doing very well on the left side wound appears to be healing Steri-Strips applied to the wound fissure to promote healing -At this point I am okay with him going back to work for training however I discussed that he needs to stay off his foot and wear his boot as much as he can at home as he has promised me that his work training is mostly sedentary.  Diabetic ulcer right foot -Severe hyperkeratosis with small open area -I do think in the future he would benefit from wound revision and possible closure as I think this hyperkeratosis is likely to continue to cause problems.  Will defer until he is healed on his left side  Procedure: Excisional Debridement of Wound Rationale: Removal of non-viable  soft tissue from the wound to promote healing.  Anesthesia: none Pre-Debridement Wound Measurements: Overlying hyperkeratosis Post-Debridement Wound Measurements: 0.5 cm x 0.5 cm x 0.1 cm  Type of Debridement: Sharp Excisional Tissue Removed: Non-viable soft tissue Depth of Debridement: subcutaneous tissue. Technique: Sharp excisional debridement to bleeding, viable wound base.  Dressing: Dry, sterile, compression dressing. Disposition: Patient tolerated procedure well. Patient to return in 1 week for follow-up.    No follow-ups on file.

## 2018-02-11 ENCOUNTER — Ambulatory Visit (INDEPENDENT_AMBULATORY_CARE_PROVIDER_SITE_OTHER): Payer: BLUE CROSS/BLUE SHIELD | Admitting: Podiatry

## 2018-02-11 ENCOUNTER — Telehealth: Payer: Self-pay

## 2018-02-11 DIAGNOSIS — Z9889 Other specified postprocedural states: Secondary | ICD-10-CM

## 2018-02-11 NOTE — Telephone Encounter (Signed)
May change to Novolog with same dosage instructions as humalog

## 2018-02-11 NOTE — Telephone Encounter (Signed)
[name] (Key: [id number])   This request has received an Unfavorable outcome. An eAppeal may be available. View the bottom of the request to see if an eAppeal is available along with any additional information provided by [employer].

## 2018-02-11 NOTE — Telephone Encounter (Signed)
Prior Authorization submitted for insulin lispro (HUMALOG) 100 UNIT/ML injection.   [name] (Key: [id number])   Your information has been submitted to [employer]. To check for an updated outcome later, reopen this PA request from your dashboard. If [employer] has not responded to your request within 24 hours, contact [place] at [phone]. If you think there may be a problem with your PA request, use our live chat feature at the bottom right.

## 2018-02-12 ENCOUNTER — Other Ambulatory Visit: Payer: Self-pay

## 2018-02-12 MED ORDER — INSULIN ASPART 100 UNIT/ML ~~LOC~~ SOLN: SUBCUTANEOUS | 3 refills | Status: DC

## 2018-02-12 NOTE — Progress Notes (Signed)
Subjective:  Patient ID: [name], male    DOB: July 01, 1970,  MRN: [id number]  Chief Complaint  Patient presents with  . Routine Post Op    Pov#5 dos 12.11.2019 Bone Biopsy Lt, Metatarsal Resection Lt, Poss. Wound Closure Lt -Dr. Edythe Lynn    DOS: 12/30/17 Procedure: Left 5th Metatarsal Resection, Wound closure  48 y.o. male returns for post-op check.  No complaints today thinks that the foot is doing fine.  Review of Systems: Negative except as noted in the HPI. Denies N/V/F/Ch.  Past Medical History:  Diagnosis Date  . Atherosclerosis    Right lower extermity with  ulceration  . CKD (chronic kidney disease), stage III [facility])    nephrologist-  dr [name] ([facility] in [place])    . Diabetic foot ulcer with osteomyelitis ([place])    right lateral foot--- s/p  amputation 4th and 5th ray , ACELL , Wound VAC  . Diabetic peripheral neuropathy ([place])   . Elevated LFTs   . Foot ulcer ([place])    left foot  . Hypertension   . Iron deficiency anemia   . Mixed hyperlipidemia   . Obesity (BMI 30-39.9)   . 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 Type 2 diabetes mellitus treated with insulin ([place])    last A1c 6.2 on 06/02/2017  . Vitamin B 12 deficiency     Current Outpatient Medications:  .  aspirin EC 81 MG tablet, Take 81 mg by mouth daily., Disp: , Rfl:  .  atorvastatin (LIPITOR) 40 MG tablet, Take 1.5 tablets (60 mg total) by mouth every morning. (Patient taking differently: Take 60 mg by mouth daily. In the morning.), Disp: 135 tablet, Rfl: 3 .  Cyanocobalamin (VITAMIN B-12) 5000 MCG SUBL, Place 5,000 mcg under the tongue daily., Disp: , Rfl:  .  feeding supplement,  GLUCERNA SHAKE, (GLUCERNA SHAKE) LIQD, Take 237 mLs by mouth 2 (two) times daily between meals., Disp: 180 Can, Rfl: 3 .  fenofibrate (TRICOR) 48 MG tablet, Take 1 tablet (48 mg total) by mouth daily. (Patient taking differently: Take 48 mg by mouth daily. In the morning.), Disp: 90 tablet, Rfl: 3 .  ferrous sulfate 325 (65 FE) MG tablet, Take 1 tablet (325 mg total) by mouth 2 (two) times daily with a meal., Disp: 60 tablet, Rfl: 3 .  hydrochlorothiazide (MICROZIDE) 12.5 MG capsule, Take 1 capsule (12.5 mg total) by mouth every morning. (Patient taking differently: Take 12.5 mg by mouth daily. In the morning.), Disp: 90 capsule, Rfl: 3 .  Insulin Degludec (TRESIBA) 100 UNIT/ML SOLN, Inject 20 Units into the skin at bedtime., Disp: 30 mL, Rfl: 3 .  insulin lispro (HUMALOG) 100 UNIT/ML injection, Inject 0.05-0.2 mLs (5-20 Units total) into the skin 2 (two) times daily., Disp: 40 mL, Rfl: 3 .  Insulin Syringe-Needle U-100 (BD INSULIN SYRINGE U/F) 31G X 5/16" 0.5 ML MISC, Use to inject insulin 4 times daily, Disp: 100 each, Rfl: 4 .  Multiple Vitamin (MULTIVITAMIN WITH MINERALS) TABS tablet, Take 1 tablet by mouth 2 (two) times daily., Disp: , Rfl:  .  Omega-3 Fatty Acids (FISH OIL) 1360 MG CAPS, Take 1,360 mg by mouth daily. , Disp: , Rfl:  .  pyridoxine (  B-6) 100 MG tablet, Take 100 mg by mouth 2 (two) times daily., Disp: , Rfl:  .  ramipril (ALTACE) 5 MG capsule, Take 1 capsule (5 mg total) by mouth every morning. (Patient taking differently: Take 5 mg by mouth daily. In the morning.), Disp: 90 capsule, Rfl: 3 .  silver sulfADIAZINE (SILVADENE) 1 % cream, Apply pea-sized amount to wound daily., Disp: 50 g, Rfl: 0 .  sitaGLIPtin (JANUVIA) 50 MG tablet, Take 1 tablet (50 mg total) by mouth every morning., Disp: 90 tablet, Rfl: 3  Social History   Tobacco Use  Smoking Status Never Smoker  Smokeless Tobacco Never Used    No Known Allergies Objective:  There were no vitals filed for this  visit. There is no height or weight on file to calculate BMI. Constitutional Well developed. Well nourished.  Vascular Foot warm and well perfused. Capillary refill normal to all digits.   Neurologic Normal speech. Oriented to person, place, and time. Epicritic sensation to light touch grossly present bilaterally.  Dermatologic Plantar wound now only a fissured area surgical incision well-healed without dehiscence   Orthopedic: Tenderness to palpation noted about the surgical site.   Radiographs: None Assessment:   1. Post-operative state    Plan:  Patient was evaluated and treated and all questions answered.  S/p foot surgery left -Wound healing without return of hyperkeratosis wound edges Steri-Stripped together to promote healing dressed with dry sterile dressing advised to keep area clean and dry but he can shower.  While swelling is still -I believe this wound will heal uneventfully in the coming weeks -Surgical incision has healed very well. -We will plan to get him into diabetic inserts or orthotics once the wound is fully healed    Return in about 2 weeks (around 02/25/2018) for Post-op, Left.

## 2018-02-12 NOTE — Telephone Encounter (Signed)
Novolog sent in

## 2018-02-14 NOTE — Progress Notes (Signed)
Subjective:  Patient ID: [name], male    DOB: 10/16/1970,  MRN: [id number]  Chief Complaint  Patient presents with  . Routine Post Op     dos 12.16.2019 Bone Biopsy Lt, Metatarsal Resection Lt, Poss. Wound Closure Lt    DOS: 01/04/18 Procedure: Left 5th Metatarsal Resection, Wound Closure, Bone Biopsy  48 y.o. male returns for post-op check.  Doing well denies pain no postoperative issues  Review of Systems: Negative except as noted in the HPI. Denies N/V/F/Ch.  Past Medical History:  Diagnosis Date  . Atherosclerosis    Right lower extermity with  ulceration  . CKD (chronic kidney disease), stage III [facility])    nephrologist-  dr [name] ([facility] in [place])    . Diabetic foot ulcer with osteomyelitis ([place])    right lateral foot--- s/p  amputation 4th and 5th ray , ACELL , Wound VAC  . Diabetic peripheral neuropathy ([place])   . Elevated LFTs   . Foot ulcer ([place])    left foot  . Hypertension   . Iron deficiency anemia   . Mixed hyperlipidemia   . Obesity (BMI 30-39.9)   . 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 Type 2 diabetes mellitus treated with insulin ([place])    last A1c 6.2 on 06/02/2017  . Vitamin B 12 deficiency     Current Outpatient Medications:  .  aspirin EC 81 MG tablet, Take 81 mg by mouth daily., Disp: , Rfl:  .  atorvastatin (LIPITOR) 40 MG tablet, Take 1.5 tablets (60 mg total) by mouth every morning. (Patient taking differently: Take 60 mg by mouth daily. In the morning.), Disp: 135 tablet, Rfl: 3 .  Cyanocobalamin (VITAMIN B-12) 5000 MCG SUBL, Place 5,000 mcg under the tongue daily., Disp: , Rfl:  .  feeding supplement, GLUCERNA SHAKE,  (GLUCERNA SHAKE) LIQD, Take 237 mLs by mouth 2 (two) times daily between meals., Disp: 180 Can, Rfl: 3 .  fenofibrate (TRICOR) 48 MG tablet, Take 1 tablet (48 mg total) by mouth daily. (Patient taking differently: Take 48 mg by mouth daily. In the morning.), Disp: 90 tablet, Rfl: 3 .  ferrous sulfate 325 (65 FE) MG tablet, Take 1 tablet (325 mg total) by mouth 2 (two) times daily with a meal., Disp: 60 tablet, Rfl: 3 .  hydrochlorothiazide (MICROZIDE) 12.5 MG capsule, Take 1 capsule (12.5 mg total) by mouth every morning. (Patient taking differently: Take 12.5 mg by mouth daily. In the morning.), Disp: 90 capsule, Rfl: 3 .  insulin aspart (NOVOLOG) 100 UNIT/ML injection, Inject 0.05-0.2 mLs (5-20 Units total) into the skin 2 (two) times daily, Disp: 3 vial, Rfl: 3 .  Insulin Degludec (TRESIBA) 100 UNIT/ML SOLN, Inject 20 Units into the skin at bedtime., Disp: 30 mL, Rfl: 3 .  Insulin Syringe-Needle U-100 (BD INSULIN SYRINGE U/F) 31G X 5/16" 0.5 ML MISC, Use to inject insulin 4 times daily, Disp: 100 each, Rfl: 4 .  Multiple Vitamin (MULTIVITAMIN WITH MINERALS) TABS tablet, Take 1 tablet by mouth 2 (two) times daily., Disp: , Rfl:  .  Omega-3 Fatty Acids (FISH OIL) 1360 MG CAPS, Take 1,360 mg by mouth daily. , Disp: , Rfl:  .  pyridoxine (B-6) 100 MG  tablet, Take 100 mg by mouth 2 (two) times daily., Disp: , Rfl:  .  ramipril (ALTACE) 5 MG capsule, Take 1 capsule (5 mg total) by mouth every morning. (Patient taking differently: Take 5 mg by mouth daily. In the morning.), Disp: 90 capsule, Rfl: 3 .  silver sulfADIAZINE (SILVADENE) 1 % cream, Apply pea-sized amount to wound daily., Disp: 50 g, Rfl: 0 .  sitaGLIPtin (JANUVIA) 50 MG tablet, Take 1 tablet (50 mg total) by mouth every morning., Disp: 90 tablet, Rfl: 3  Social History   Tobacco Use  Smoking Status Never Smoker  Smokeless Tobacco Never Used    No Known Allergies Objective:  There were no vitals filed for this visit. There is no height  or weight on file to calculate BMI. Constitutional Well developed. Well nourished.  Vascular Foot warm and well perfused. Capillary refill normal to all digits.   Neurologic Normal speech. Oriented to person, place, and time. Epicritic sensation to light touch grossly present bilaterally.  Dermatologic Skin healing well without signs of infection. Skin edges well coapted without signs of infection.  Orthopedic: Tenderness to palpation noted about the surgical site.   Radiographs: None Assessment:   1. Diabetic ulcer of left midfoot associated with diabetes mellitus due to underlying condition, limited to breakdown of skin [facility])    Plan:  Patient was evaluated and treated and all questions answered.  S/p foot surgery left -Progressing as expected post-operatively. -XR: None -WB Status: WBAT in CAM boot -Sutures: Intact. -Medications: none refilled today. -Foot redressed.  Return in about 1 week (around 01/14/2018) for keep current post op appt, [name] patient.

## 2018-02-14 NOTE — Progress Notes (Signed)
Subjective:  Patient ID: [name], male    DOB: Apr 11, 1970,  MRN: [id number]  Chief Complaint  Patient presents with  . Consult    surgical consult, foot ulcer left fifth metatarsal with history of amputation right    48 y.o. male presents with the above complaint.  Patient will consult from Dr. Paulla Dolly to evaluate for the ulceration.  Has had this ulceration for several weeks states it is getting larger and has failed other conservative care concerned about possible infection in the bone.  Review of Systems: Negative except as noted in the HPI. Denies N/V/F/Ch.  Past Medical History:  Diagnosis Date  . Atherosclerosis    Right lower extermity with  ulceration  . CKD (chronic kidney disease), stage III [facility])    nephrologist-  dr [name] ([facility] in [place])    . Diabetic foot ulcer with osteomyelitis ([place])    right lateral foot--- s/p  amputation 4th and 5th ray , ACELL , Wound VAC  . Diabetic peripheral neuropathy ([place])   . Elevated LFTs   . Foot ulcer ([place])    left foot  . Hypertension   . Iron deficiency anemia   . Mixed hyperlipidemia   . Obesity (BMI 30-39.9)   . 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 Type 2 diabetes mellitus treated with insulin ([place])    last A1c 6.2 on 06/02/2017  . Vitamin B 12 deficiency     Current Outpatient Medications:  .  aspirin EC 81 MG tablet, Take 81 mg by mouth daily., Disp: , Rfl:  .  atorvastatin (LIPITOR) 40 MG tablet, Take 1.5 tablets (60 mg total) by mouth every morning. (Patient taking differently: Take 60 mg by mouth daily. In the morning.), Disp: 135 tablet, Rfl: 3 .  Cyanocobalamin (VITAMIN B-12) 5000 MCG SUBL, Place  5,000 mcg under the tongue daily., Disp: , Rfl:  .  feeding supplement, GLUCERNA SHAKE, (GLUCERNA SHAKE) LIQD, Take 237 mLs by mouth 2 (two) times daily between meals., Disp: 180 Can, Rfl: 3 .  fenofibrate (TRICOR) 48 MG tablet, Take 1 tablet (48 mg total) by mouth daily. (Patient taking differently: Take 48 mg by mouth daily. In the morning.), Disp: 90 tablet, Rfl: 3 .  ferrous sulfate 325 (65 FE) MG tablet, Take 1 tablet (325 mg total) by mouth 2 (two) times daily with a meal., Disp: 60 tablet, Rfl: 3 .  hydrochlorothiazide (MICROZIDE) 12.5 MG capsule, Take 1 capsule (12.5 mg total) by mouth every morning. (Patient taking differently: Take 12.5 mg by mouth daily. In the morning.), Disp: 90 capsule, Rfl: 3 .  insulin aspart (NOVOLOG) 100 UNIT/ML injection, Inject 0.05-0.2 mLs (5-20 Units total) into the skin 2 (two) times daily, Disp: 3 vial, Rfl: 3 .  Insulin Degludec (TRESIBA) 100 UNIT/ML SOLN, Inject 20 Units into the skin at bedtime., Disp: 30 mL, Rfl: 3 .  Insulin Syringe-Needle U-100 (BD INSULIN SYRINGE U/F) 31G X 5/16" 0.5 ML MISC, Use to inject insulin 4 times daily, Disp: 100 each, Rfl: 4 .  Multiple Vitamin (MULTIVITAMIN WITH MINERALS) TABS tablet, Take 1 tablet by mouth 2 (two) times daily., Disp: , Rfl:  .  Omega-3 Fatty Acids (FISH OIL) 1360 MG CAPS, Take  1,360 mg by mouth daily. , Disp: , Rfl:  .  pyridoxine (B-6) 100 MG tablet, Take 100 mg by mouth 2 (two) times daily., Disp: , Rfl:  .  ramipril (ALTACE) 5 MG capsule, Take 1 capsule (5 mg total) by mouth every morning. (Patient taking differently: Take 5 mg by mouth daily. In the morning.), Disp: 90 capsule, Rfl: 3 .  silver sulfADIAZINE (SILVADENE) 1 % cream, Apply pea-sized amount to wound daily., Disp: 50 g, Rfl: 0 .  sitaGLIPtin (JANUVIA) 50 MG tablet, Take 1 tablet (50 mg total) by mouth every morning., Disp: 90 tablet, Rfl: 3  Social History   Tobacco Use  Smoking Status Never Smoker  Smokeless Tobacco Never Used    No  Known Allergies Objective:  There were no vitals filed for this visit. There is no height or weight on file to calculate BMI. Constitutional Well developed. Well nourished.  Vascular Dorsalis pedis pulses palpable bilaterally. Posterior tibial pulses palpable bilaterally. Capillary refill normal to all digits.  No cyanosis or clubbing noted. Pedal hair growth normal.  Neurologic Normal speech. Oriented to person, place, and time. Epicritic sensation to light touch grossly present bilaterally.  Dermatologic Nails well groomed and normal in appearance.  4 x 3 cm ulceration post debridement noted sub-met 5 left granular wound base hyperkeratotic border probe to subfascial layer but not to bone  Orthopedic: Normal joint ROM without pain or crepitus bilaterally. No visible deformities. No bony tenderness.   Radiographs: Prior x-rays reviewed no definite evidence of osteomyelitis Assessment:   1. Cellulitis and abscess of toe of left foot   2. Deformity of metatarsal, left   3. Diabetic ulcer of left ankle associated with diabetes mellitus due to underlying condition, with necrosis of muscle ([place])    Plan:  Patient was evaluated and treated and all questions answered.  Diabetic foot ulcer left foot with necrosis of muscle -X prior x-rays reviewed -Ulcer debrided as below -Rx Clinda Cipro prophylactically -Discussed with patient the need for excision of the underlying bony prominence for curative reduction of pressure of the ulceration.  Discussed risks and benefits of surgery.  We will biopsy the bone at the same time to evaluate for osteomyelitis.  Should he have osteomyelitis he will need extended antibiotic therapy. -Patient has failed all conservative therapy and wishes to proceed with surgical intervention. All risks, benefits, and alternatives discussed with patient. No guarantees given. Consent reviewed and signed by patient. -Planned procedures: Excision of left fifth  metatarsal head, bone biopsy, wound closure  Procedure: Excisional Debridement of Wound Rationale: Removal of non-viable soft tissue from the wound to promote healing.  Anesthesia: none Pre-Debridement Wound Measurements: 3 cm x 3 cm x 0.5 cm  Post-Debridement Wound Measurements: 3 cm x 4 cm x 0.5 cm  Type of Debridement: Sharp Excisional Tissue Removed: Non-viable soft tissue Depth of Debridement: muscle tissue. Technique: Sharp excisional debridement to bleeding, viable wound base.  Dressing: Dry, sterile, compression dressing. Disposition: Patient tolerated procedure well. Patient to return in 1 week for follow-up.  Return for post op.

## 2018-02-25 ENCOUNTER — Ambulatory Visit (INDEPENDENT_AMBULATORY_CARE_PROVIDER_SITE_OTHER): Payer: BLUE CROSS/BLUE SHIELD | Admitting: Podiatry

## 2018-02-25 DIAGNOSIS — Z9889 Other specified postprocedural states: Secondary | ICD-10-CM

## 2018-02-26 NOTE — Progress Notes (Signed)
Subjective:  Patient ID: [name], male    DOB: 09-Oct-1970,  MRN: [id number]  Chief Complaint  Patient presents with  . Routine Post Op    Pt states healing well with no concerns and no pain.     DOS: 12/30/17 Procedure: Left 5th Metatarsal Resection, Wound closure  48 y.o. male returns for post-op check.  Doing very well denies pain states that the sore is doing well denies drainage. Ambulating in normal shoe.  Review of Systems: Negative except as noted in the HPI. Denies N/V/F/Ch.  Past Medical History:  Diagnosis Date  . Atherosclerosis    Right lower extermity with  ulceration  . CKD (chronic kidney disease), stage III [facility])    nephrologist-  dr [name] ([facility] in [place])    . Diabetic foot ulcer with osteomyelitis ([place])    right lateral foot--- s/p  amputation 4th and 5th ray , ACELL , Wound VAC  . Diabetic peripheral neuropathy ([place])   . Elevated LFTs   . Foot ulcer ([place])    left foot  . Hypertension   . Iron deficiency anemia   . Mixed hyperlipidemia   . Obesity (BMI 30-39.9)   . 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 Type 2 diabetes mellitus treated with insulin ([place])    last A1c 6.2 on 06/02/2017  . Vitamin B 12 deficiency     Current Outpatient Medications:  .  aspirin EC 81 MG tablet, Take 81 mg by mouth daily., Disp: , Rfl:  .  atorvastatin (LIPITOR) 40 MG tablet, Take 1.5 tablets (60 mg total) by mouth every morning. (Patient taking differently: Take 60 mg by mouth daily. In the morning.), Disp: 135 tablet, Rfl: 3 .  Cyanocobalamin (VITAMIN B-12) 5000 MCG SUBL, Place 5,000 mcg under the tongue daily., Disp: , Rfl:  .  feeding supplement,  GLUCERNA SHAKE, (GLUCERNA SHAKE) LIQD, Take 237 mLs by mouth 2 (two) times daily between meals., Disp: 180 Can, Rfl: 3 .  fenofibrate (TRICOR) 48 MG tablet, Take 1 tablet (48 mg total) by mouth daily. (Patient taking differently: Take 48 mg by mouth daily. In the morning.), Disp: 90 tablet, Rfl: 3 .  ferrous sulfate 325 (65 FE) MG tablet, Take 1 tablet (325 mg total) by mouth 2 (two) times daily with a meal., Disp: 60 tablet, Rfl: 3 .  hydrochlorothiazide (MICROZIDE) 12.5 MG capsule, Take 1 capsule (12.5 mg total) by mouth every morning. (Patient taking differently: Take 12.5 mg by mouth daily. In the morning.), Disp: 90 capsule, Rfl: 3 .  insulin aspart (NOVOLOG) 100 UNIT/ML injection, Inject 0.05-0.2 mLs (5-20 Units total) into the skin 2 (two) times daily, Disp: 3 vial, Rfl: 3 .  Insulin Degludec (TRESIBA) 100 UNIT/ML SOLN, Inject 20 Units into the skin at bedtime., Disp: 30 mL, Rfl: 3 .  Insulin Syringe-Needle U-100 (BD INSULIN SYRINGE U/F) 31G X 5/16" 0.5 ML MISC, Use to inject insulin 4 times daily, Disp: 100 each, Rfl: 4 .  Multiple Vitamin (MULTIVITAMIN WITH MINERALS) TABS tablet, Take 1 tablet by mouth 2 (two) times daily., Disp: , Rfl:  .  Omega-3 Fatty Acids (FISH OIL) 1360 MG CAPS, Take 1,360 mg by mouth daily. , Disp: , Rfl:  .  pyridoxine (B-6) 100 MG tablet, Take 100 mg by mouth 2 (two) times daily., Disp: , Rfl:  .  ramipril (ALTACE) 5 MG capsule, Take 1 capsule (5 mg total) by mouth every morning. (Patient taking differently: Take 5 mg by mouth daily. In the morning.), Disp: 90 capsule, Rfl: 3 .  silver sulfADIAZINE (SILVADENE) 1 % cream, Apply pea-sized amount to wound daily., Disp: 50 g, Rfl: 0 .  sitaGLIPtin (JANUVIA) 50 MG tablet, Take 1 tablet (50 mg total) by mouth every morning., Disp: 90 tablet, Rfl: 3  Social History   Tobacco Use  Smoking Status Never Smoker  Smokeless Tobacco Never Used    No Known Allergies Objective:  There were no vitals filed for this  visit. There is no height or weight on file to calculate BMI. Constitutional Well developed. Well nourished.  Vascular Foot warm and well perfused. Capillary refill normal to all digits.   Neurologic Normal speech. Oriented to person, place, and time. Epicritic sensation to light touch grossly present bilaterally.  Dermatologic Plantar wound continued small fissured area of dehiscence without drainage   Orthopedic: No tenderness to palpation noted about the surgical site.   Radiographs: None Assessment:   1. Post-operative state    Plan:  Patient was evaluated and treated and all questions answered.  S/p foot surgery left -Wound healing well still some small area of fissure.  Wound edges debrided and Steri-Strips reapplied to promote coaptation.  Discussed with patient that next month if it is not healed we will consider surgical repair wound dehiscence.   No follow-ups on file.

## 2018-03-24 ENCOUNTER — Other Ambulatory Visit: Payer: Self-pay | Admitting: Family Medicine

## 2018-03-25 ENCOUNTER — Other Ambulatory Visit: Payer: Self-pay | Admitting: Family Medicine

## 2018-03-25 ENCOUNTER — Ambulatory Visit (INDEPENDENT_AMBULATORY_CARE_PROVIDER_SITE_OTHER): Payer: BLUE CROSS/BLUE SHIELD | Admitting: Podiatry

## 2018-03-25 ENCOUNTER — Encounter: Payer: Self-pay | Admitting: Podiatry

## 2018-03-25 DIAGNOSIS — Z89421 Acquired absence of other right toe(s): Secondary | ICD-10-CM | POA: Diagnosis not present

## 2018-03-25 DIAGNOSIS — E1142 Type 2 diabetes mellitus with diabetic polyneuropathy: Secondary | ICD-10-CM | POA: Diagnosis not present

## 2018-03-25 MED ORDER — INSULIN DEGLUDEC 100 UNIT/ML ~~LOC~~ SOLN: 20.0000 [IU] | Freq: Every day | SUBCUTANEOUS | 3 refills | Status: DC

## 2018-03-25 MED ORDER — SITAGLIPTIN PHOSPHATE 50 MG PO TABS: 50.0000 mg | ORAL_TABLET | Freq: Every morning | ORAL | 3 refills | Status: DC

## 2018-03-25 MED ORDER — INSULIN ASPART 100 UNIT/ML ~~LOC~~ SOLN: SUBCUTANEOUS | 3 refills | Status: DC

## 2018-03-25 NOTE — Progress Notes (Signed)
Subjective:  Patient ID: [name], male    DOB: 1970/01/22,  MRN: [id number]  Chief Complaint  Patient presents with  . Wound Check    bilateral follow up; "doing alright; no pain today; no other concerns"    DOS: 12/30/17 Procedure: Left 5th Metatarsal Resection, Wound closure  48 y.o. male returns for post-op check.  Doing well thinks the foot is healed.  Review of Systems: Negative except as noted in the HPI. Denies N/V/F/Ch.  Past Medical History:  Diagnosis Date  . Atherosclerosis    Right lower extermity with  ulceration  . CKD (chronic kidney disease), stage III [facility])    nephrologist-  dr [name] ([facility] in [place])    . Diabetic foot ulcer with osteomyelitis ([place])    right lateral foot--- s/p  amputation 4th and 5th ray , ACELL , Wound VAC  . Diabetic peripheral neuropathy ([place])   . Elevated LFTs   . Foot ulcer ([place])    left foot  . Hypertension   . Iron deficiency anemia   . Mixed hyperlipidemia   . Obesity (BMI 30-39.9)   . 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 Type 2 diabetes mellitus treated with insulin ([place])    last A1c 6.2 on 06/02/2017  . Vitamin B 12 deficiency     Current Outpatient Medications:  .  aspirin EC 81 MG tablet, Take 81 mg by mouth daily., Disp: , Rfl:  .  atorvastatin (LIPITOR) 40 MG tablet, Take 1.5 tablets (60 mg total) by mouth every morning. (Patient taking differently: Take 60 mg by mouth daily. In the morning.), Disp: 135 tablet, Rfl: 3 .  BD VEO INSULIN SYRINGE U/F 31G X 15/64" 0.3 ML MISC, USE TO INJECT INSULIN 4 TIMES DAILY, Disp: 300 each, Rfl: 2 .  Cyanocobalamin (VITAMIN B-12) 5000 MCG SUBL, Place 5,000 mcg  under the tongue daily., Disp: , Rfl:  .  feeding supplement, GLUCERNA SHAKE, (GLUCERNA SHAKE) LIQD, Take 237 mLs by mouth 2 (two) times daily between meals., Disp: 180 Can, Rfl: 3 .  fenofibrate (TRICOR) 48 MG tablet, Take 1 tablet (48 mg total) by mouth daily. (Patient taking differently: Take 48 mg by mouth daily. In the morning.), Disp: 90 tablet, Rfl: 3 .  ferrous sulfate 325 (65 FE) MG tablet, Take 1 tablet (325 mg total) by mouth 2 (two) times daily with a meal., Disp: 60 tablet, Rfl: 3 .  hydrochlorothiazide (MICROZIDE) 12.5 MG capsule, Take 1 capsule (12.5 mg total) by mouth every morning. (Patient taking differently: Take 12.5 mg by mouth daily. In the morning.), Disp: 90 capsule, Rfl: 3 .  insulin aspart (NOVOLOG) 100 UNIT/ML injection, Inject 0.05-0.2 mLs (5-20 Units total) into the skin 2 (two) times daily, Disp: 30 vial, Rfl: 3 .  Insulin Degludec (TRESIBA) 100 UNIT/ML SOLN, Inject 20 Units into the skin at bedtime., Disp: 30 mL, Rfl: 3 .  Insulin Syringe-Needle U-100 (BD INSULIN SYRINGE U/F) 31G X 5/16" 0.5 ML MISC, Use to inject insulin 4 times daily, Disp: 100 each, Rfl: 4 .  Multiple Vitamin (MULTIVITAMIN WITH MINERALS) TABS tablet, Take 1 tablet by mouth 2 (two) times daily., Disp: , Rfl:  .  Omega-3 Fatty Acids (FISH OIL)  1360 MG CAPS, Take 1,360 mg by mouth daily. , Disp: , Rfl:  .  pyridoxine (B-6) 100 MG tablet, Take 100 mg by mouth 2 (two) times daily., Disp: , Rfl:  .  ramipril (ALTACE) 5 MG capsule, Take 1 capsule (5 mg total) by mouth every morning. (Patient taking differently: Take 5 mg by mouth daily. In the morning.), Disp: 90 capsule, Rfl: 3 .  silver sulfADIAZINE (SILVADENE) 1 % cream, Apply pea-sized amount to wound daily., Disp: 50 g, Rfl: 0 .  sitaGLIPtin (JANUVIA) 50 MG tablet, Take 1 tablet (50 mg total) by mouth every morning., Disp: 90 tablet, Rfl: 3  Social History   Tobacco Use  Smoking Status Never Smoker  Smokeless Tobacco Never Used    No Known  Allergies Objective:  There were no vitals filed for this visit. There is no height or weight on file to calculate BMI. Constitutional Well developed. Well nourished.  Vascular Foot warm and well perfused. Capillary refill normal to all digits.   Neurologic Normal speech. Oriented to person, place, and time. Epicritic sensation to light touch grossly present bilaterally.  Dermatologic Plantar wound healed left.  Orthopedic: No tenderness to palpation noted about the surgical site. Partial foot amputation right - 4th/5th rays.   Radiographs: None Assessment:   1. DM type 2 with diabetic peripheral neuropathy ([place])   2. 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  S/p foot surgery left -Wound healed. Will have patient f/u in 1 month for routine foot care services.  Partial Foot amputation right -Will fabricate new DM inserts, right lateral foot filler, possible shoes due to ulceration, hx of partial foot multiple ray amputations.   No follow-ups on file.

## 2018-03-29 ENCOUNTER — Telehealth: Payer: Self-pay | Admitting: *Deleted

## 2018-03-29 NOTE — Telephone Encounter (Signed)
"  I'm calling regards to a mutual patient, [name]  Hardie Pulley performed a service on 01/06/2018.  I was wondering if a prior authorization was obtained.  Call me back."

## 2018-04-15 ENCOUNTER — Ambulatory Visit: Payer: BLUE CROSS/BLUE SHIELD | Admitting: Orthotics

## 2018-04-23 ENCOUNTER — Ambulatory Visit: Payer: BLUE CROSS/BLUE SHIELD | Admitting: Podiatry

## 2018-06-07 ENCOUNTER — Encounter: Payer: BLUE CROSS/BLUE SHIELD | Admitting: Family Medicine

## 2018-06-11 LAB — HM DIABETES EYE EXAM

## 2018-07-08 ENCOUNTER — Other Ambulatory Visit: Payer: Self-pay | Admitting: Family Medicine

## 2018-07-17 IMAGING — DX DG FOOT COMPLETE 3+V*R*
4 series · 4 of 4 positions shown · non-contrast
Comparison: None.

CLINICAL DATA: Large "blister" or ulcer on plantar surface of rt
foot around distal 3rd, 4th and 5th metatarsals, w/o trauma or
surgery, diabetes//a.c.

EXAM:
RIGHT FOOT COMPLETE - 3+ VIEW

[foot ap (1 of 2)]
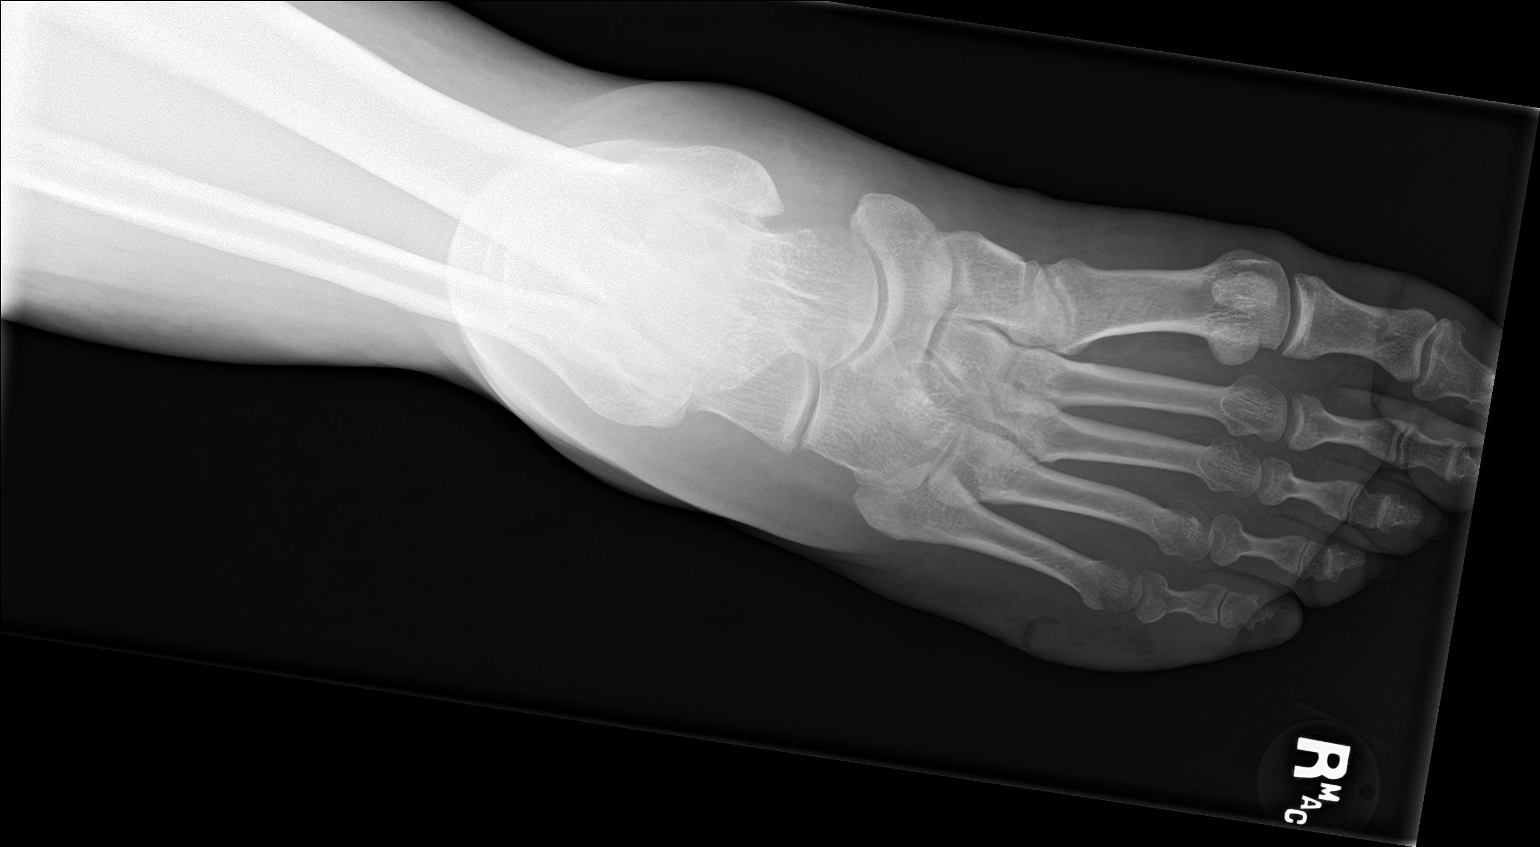

[foot obl]
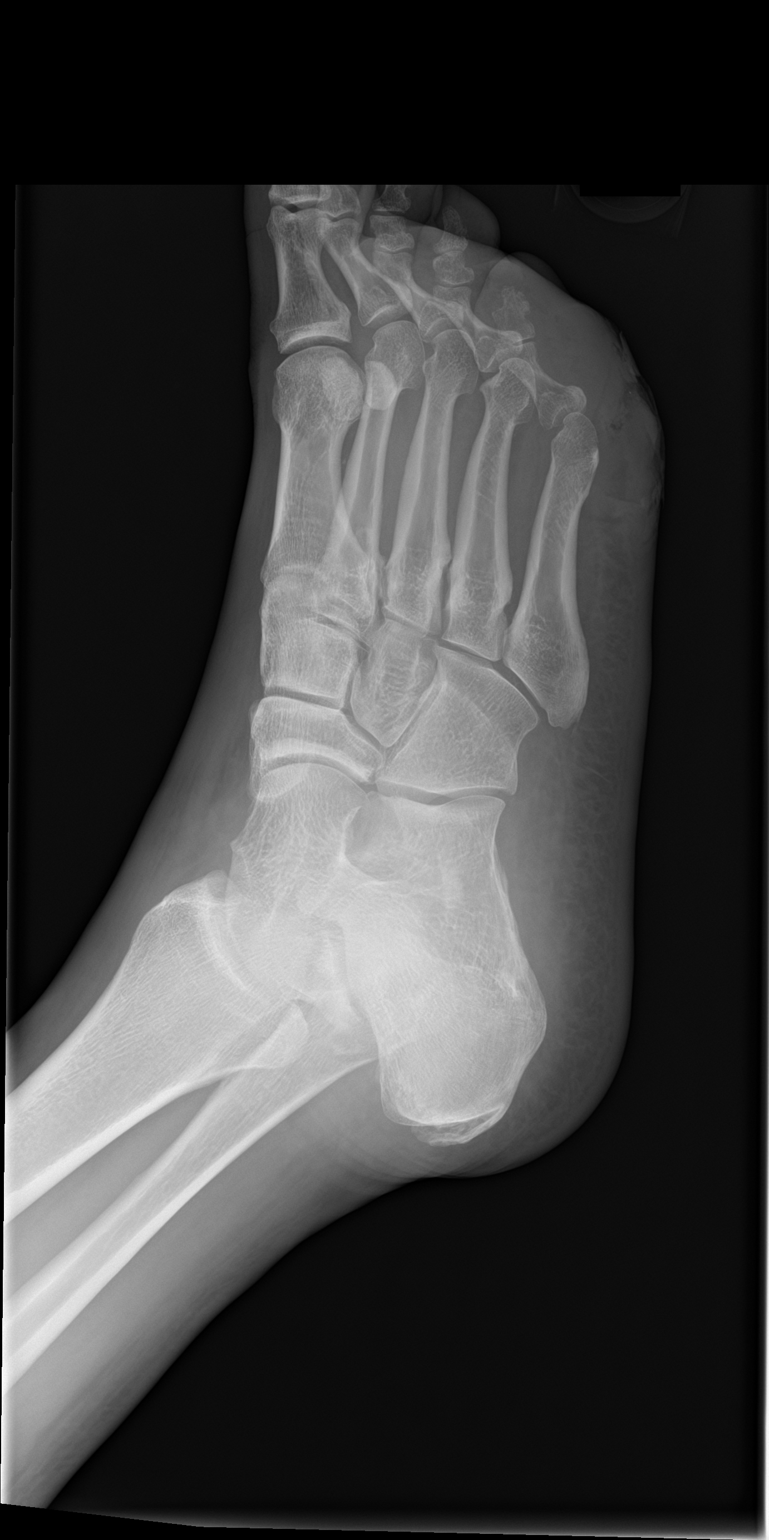

[foot lat]
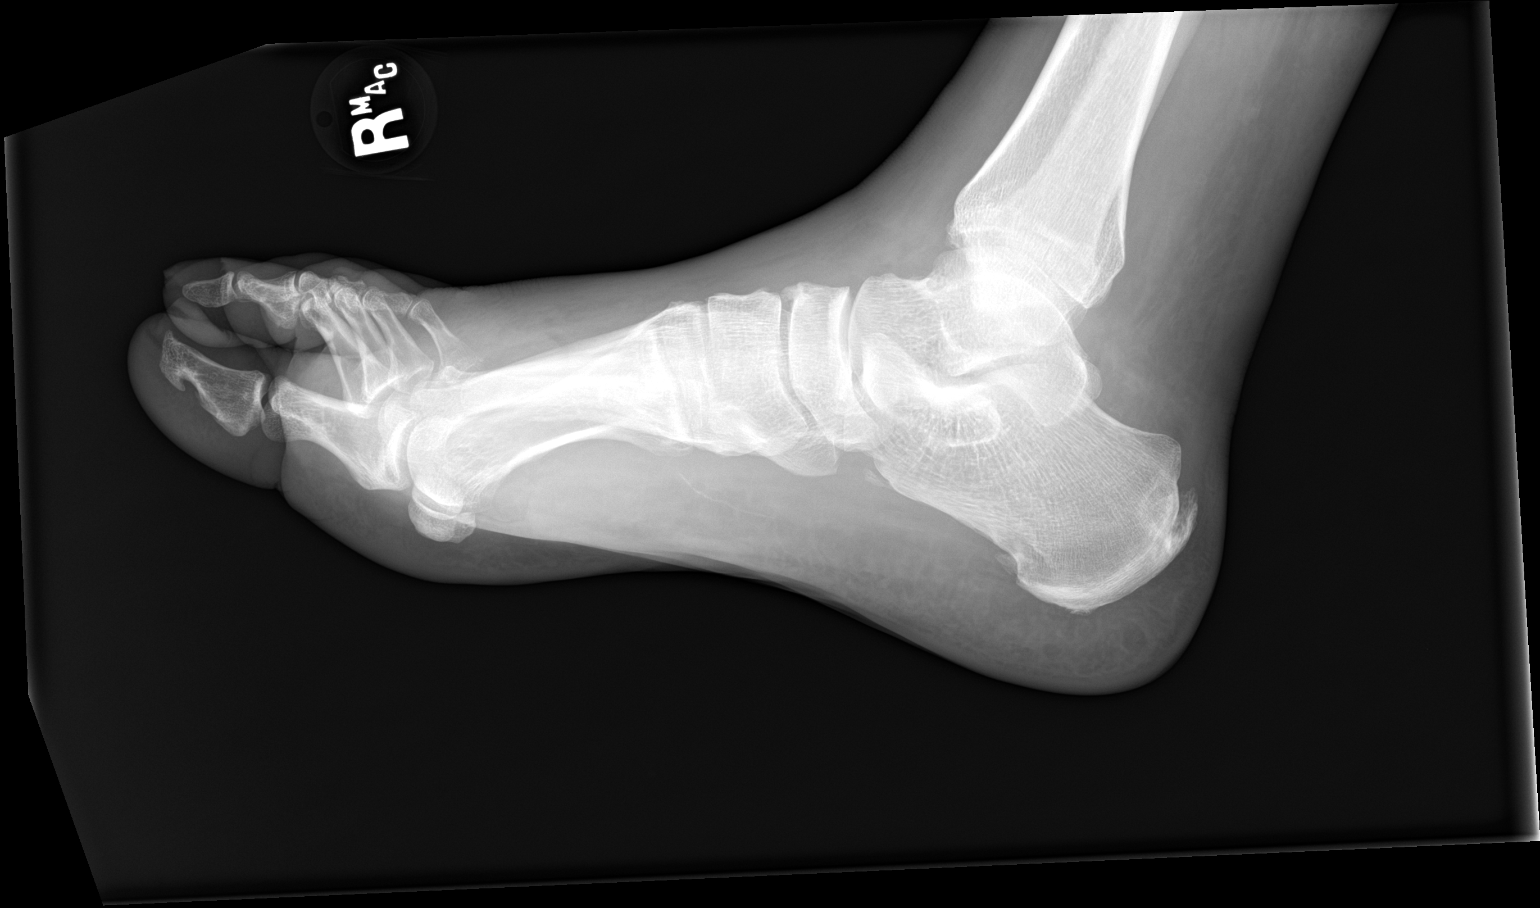

[foot ap (2 of 2)]
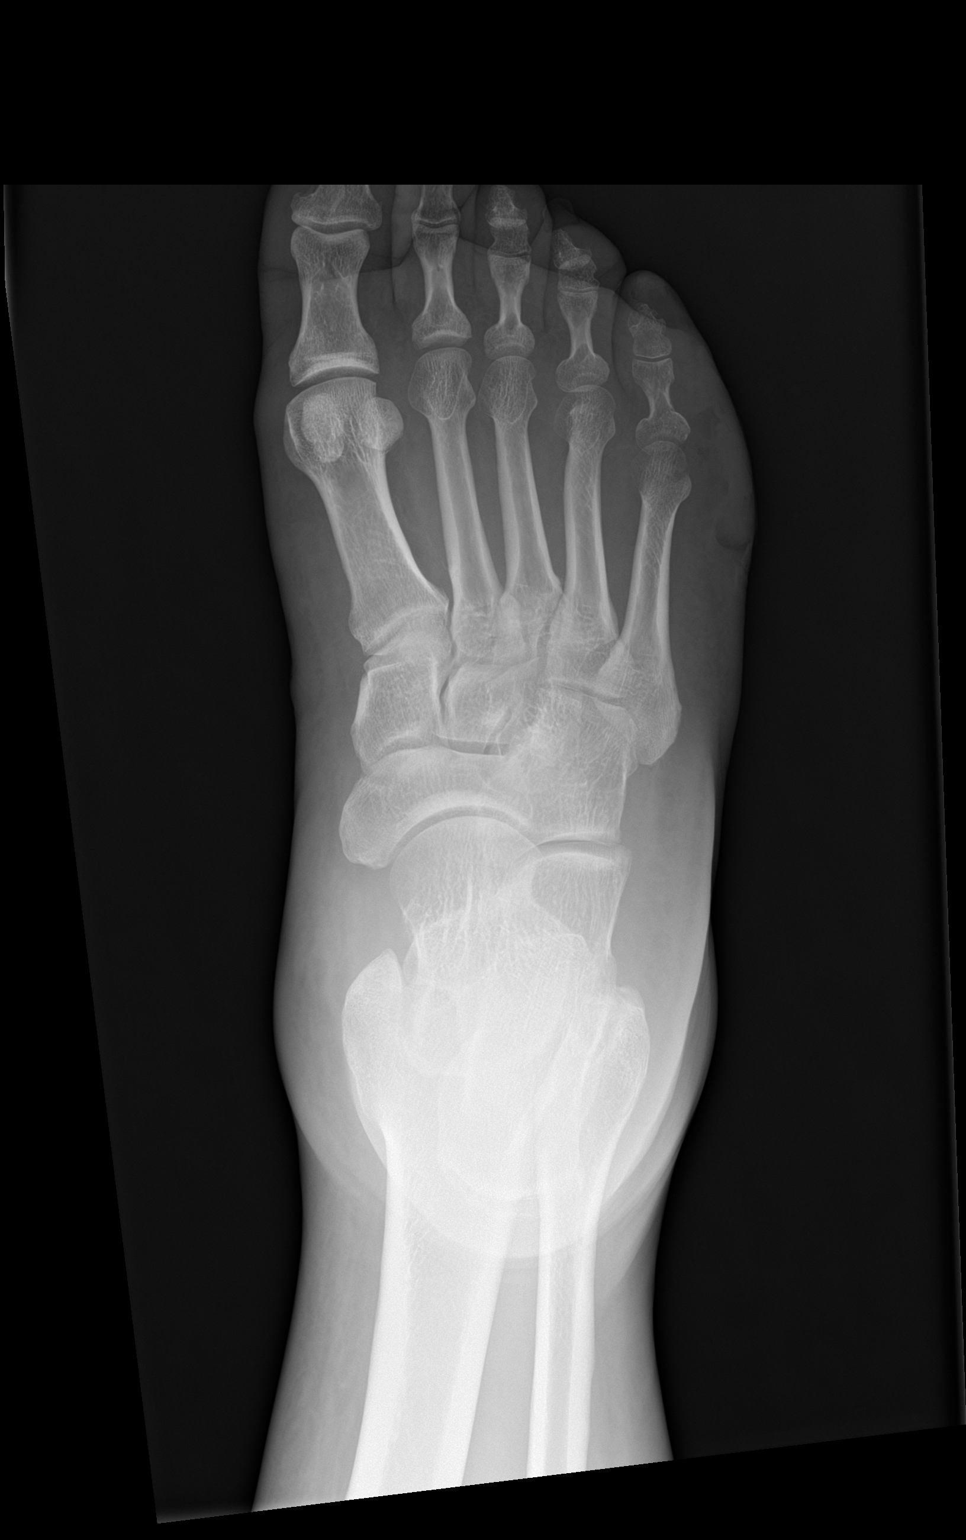

[4 of 4 positions shown; findings below may reference images not displayed]

FINDINGS: There is soft tissue swelling along the lateral aspect of the foot,
at the level of the fifth metatarsophalangeal joint. Locules of gas
in this region are consistent with the history of ulceration. No
soft tissue gas elsewhere. No adjacent cortical erosion. No
associated radiopaque foreign body. There is no acute fracture or
dislocation.
IMPRESSION: Soft tissue swelling and ulcer in the region of the fifth
metatarsophalangeal joint.

## 2018-07-20 ENCOUNTER — Encounter: Payer: Self-pay | Admitting: Family Medicine

## 2018-09-17 ENCOUNTER — Encounter: Payer: BLUE CROSS/BLUE SHIELD | Admitting: Family Medicine

## 2018-09-22 ENCOUNTER — Telehealth: Payer: Self-pay

## 2018-09-22 NOTE — Telephone Encounter (Signed)
Prior Auth for HumaLog  was faxed to our office. Do not see where pt was prescribed humaLog. Tried to call pt to see if this request was appropriate no answer.  Completed the prior auth just in case its needed.   [facility] [name] [facility] - Rx #[id number]  Need help? Call us at [phone]    Status  Sent to [employer]   Drug HumaLOG 100UNIT/ML solution Form [employer] control surveyor Form (CB)   Original Claim Info 75 PA REQUIRED. USE NOVO (NO PA REQUIRED)

## 2018-09-29 ENCOUNTER — Telehealth: Payer: Self-pay | Admitting: Family Medicine

## 2018-09-29 ENCOUNTER — Telehealth: Payer: Self-pay | Admitting: Physical Therapy

## 2018-09-29 MED ORDER — BD LANCET ULTRAFINE 33G MISC: 1 refills | Status: DC

## 2018-09-29 MED ORDER — INSULIN ASPART 100 UNIT/ML ~~LOC~~ SOLN: SUBCUTANEOUS | 1 refills | Status: DC

## 2018-09-29 MED ORDER — ACCU-CHEK GUIDE VI STRP: ORAL_STRIP | 1 refills | Status: DC

## 2018-09-29 NOTE — Telephone Encounter (Signed)
Rx sent to pharmacy for Novolog. Pt had change in insurance 09/2018. Will need to check to make sure Novolog is covered. If Novolog is not preferred, OK to send in new rx for Humalog? Pt is out of insulin.

## 2018-09-29 NOTE — Addendum Note (Signed)
Addended by: Jasper Loser on: 09/29/2018 02:23 PM   Modules accepted: Orders

## 2018-09-29 NOTE — Telephone Encounter (Signed)
Called pharmacy, rx covered. Called pt and left VM advising that rx discussed earlier is covered and being filled at his pharmacy.

## 2018-09-29 NOTE — Telephone Encounter (Signed)
Pt wife called and stated that pt would like an Rx for 33 gage lancets and accucheck guide test strips. If patient has a Rx he can get them for $5 instead of $30.   Pharmacy :  [facility] [place], [place] - [address] AT [address] [phone] (Phone) [phone] (Fax)

## 2018-09-29 NOTE — Telephone Encounter (Signed)
Copied from [place] [phone]. Topic: General - Other >> Sep 29, 2018 12:32 PM Mcneil, Ja-Kwan wrote: Reason for CRM: Pt wife stated pt is out of insulin and would like Rx for insulin lispro (HUMALOG) 100 UNIT/ML injection. Humalog shows as discontinued. Pt wife asked that a new Rx for either Humalog or Novolog be sent to pt pharmacy

## 2018-09-29 NOTE — Telephone Encounter (Signed)
Rx sent to pharmacy. [name] aware.

## 2018-09-29 NOTE — Telephone Encounter (Signed)
Yes thanks-may use either Humalog or NovoLog with same instructions

## 2018-10-01 ENCOUNTER — Telehealth: Payer: Self-pay | Admitting: Family Medicine

## 2018-10-01 MED ORDER — LANCETS MISC: 0 refills | Status: DC

## 2018-10-01 NOTE — Telephone Encounter (Signed)
See below

## 2018-10-01 NOTE — Telephone Encounter (Signed)
[facility] [name] [facility] - Rx #[id number]  Need help? Call us at [phone]    Outcome  Deniedon September 4   Drug HumaLOG 100UNIT/ML solution Form Librarian, academic Form (CB)   Original Claim Info 75 PA REQUIRED. USE NOVO (NO PA NEEDED)  Called pt to inform of update but no answer. Will try again shortly

## 2018-10-01 NOTE — Telephone Encounter (Signed)
Copied from [place] [phone]. Topic: General - Other >> Oct 01, 2018 11:02 AM Keene Breath wrote: Reason for CRM: Patient called to have the nurse or the doctor rewrite his script for his Lancets.  He stated that they do not make the B-D brand and he would like the generic so he can get a 90-day supply.  Please call to discuss.  Patient is all out of the Lancets.  CB# [phone]

## 2018-10-01 NOTE — Telephone Encounter (Signed)
Rx has been sent to pharmacy

## 2018-10-14 NOTE — Patient Instructions (Addendum)
Health Maintenance Due  Topic Date Due  . INFLUENZA VACCINE - today 08/21/2018    We will call you within two weeks about your referral to GI. If you do not hear within 3 weeks, give [place] a call.  Check with your insurance to make sure colonoscopy at 45-50 is covered- I think its a good idea anyway with iron deficiency history   Please make sure to schedule podiatry follow up ASAP for the left foot   Lets set a goal of at least 5-10 lbs off by follow up but with how you are doing with walking and exercise you may blow by it  Please stop by lab before you go If you do not have mychart- we will call you about results within 5 business days of [place] receiving them.  If you have mychart- we will send your results within 3 business days of [place] receiving them.  If abnormal or we want to clarify a result, we will call or mychart you to make sure you receive the message.  If you have questions or concerns or don't hear within 5-7 days, please send [place] a message or call us.

## 2018-10-14 NOTE — Progress Notes (Signed)
Phone: [phone]    Subjective:  Patient presents today for their annual physical. Chief complaint-noted.   See problem oriented charting- ROS- full  review of systems was completed and negative  except for: cramping right hand at time, callus on feet- following up with podiatry  The following were reviewed and entered/updated in epic: Past Medical History:  Diagnosis Date  . Atherosclerosis    Right lower extermity with  ulceration  . CKD (chronic kidney disease), stage III [facility])    nephrologist-  dr [name] ([facility] in [place])    . Diabetic foot ulcer with osteomyelitis ([place])    right lateral foot--- s/p  amputation 4th and 5th ray , ACELL , Wound VAC  . Diabetic peripheral neuropathy ([place])   . Elevated LFTs   . Foot ulcer ([place])    left foot  . Hypertension   . Iron deficiency anemia   . Mixed hyperlipidemia   . Obesity (BMI 30-39.9)   . 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 Type 2 diabetes mellitus treated with insulin ([place])    last A1c 6.2 on 06/02/2017  . Vitamin B 12 deficiency    Patient Active Problem List   Diagnosis Date Noted  . Diabetic foot ulcer with osteomyelitis ([place]) 07/29/2016    Priority: High  . Morbid obesity ([place]) 03/08/2015    Priority: High  . Type II diabetes mellitus with neurological manifestations ([place]) 03/07/2015    Priority: High  . Diabetic retinopathy ([place]) 01/25/2018    Priority: Medium  . Chronic kidney disease (CKD), stage III (moderate) ([place]) 11/17/2016    Priority: Medium  . Diabetic neuropathy ([place]) 11/17/2016    Priority: Medium  . Elevated LFTs     Priority: Medium  . Dyslipidemia 03/08/2015    Priority: Medium  .  HTN (hypertension) 03/08/2015    Priority: Medium  . Obesity, Class II, BMI 35-39.9 06/02/2017    Priority: Low  . Status post split thickness skin graft 12/08/2016    Priority: Low  . B12 deficiency 11/17/2016    Priority: Low  . Iron deficiency anemia 11/17/2016    Priority: Low  . 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 Atherosclerosis of artery of extremity with ulceration ([place]) 02/04/2018   Past Surgical History:  Procedure Laterality Date  . AMPUTATION Right 07/31/2016   Procedure: AMPUTATION RIGHT FIFTH RAY AND APPLICATION OF WOUND VAC;  Surgeon: Dorna Leitz, MD;  Location: [facility];  Service: Orthopedics;  Laterality: Right;  . AMPUTATION Right 08/08/2016   Procedure: RAY AMPUTATION RIGHT FOURTH METATARSAL, DEBRIDEMENT OF SUBQ TISSUE,BONE AND SKIN WITH WOUND CLOSURE;  Surgeon: Dorna Leitz, MD;  Location: [facility];  Service: Orthopedics;  Laterality: Right;  . ANAL FISSURE REPAIR  2000  . APPLICATION OF A-CELL OF EXTREMITY Right 08/13/2016   Procedure: APPLICATION OF A-CELL AND VAC;  Surgeon: Wallace Going, DO;  Location: [facility];  Service: Plastics;  Laterality: Right;  . APPLICATION OF A-CELL OF EXTREMITY Right 07/16/2017   Procedure: CELLERATE PLACEMENT;  Surgeon: Marla Roe,  Loel Lofty, DO;  Location: [facility];  Service: Plastics;  Laterality: Right;  . APPLICATION OF WOUND VAC Right 09/18/2016   Procedure: APPLICATION OF WOUND VAC (PT. WILL BRING HIS OWN);  Surgeon: Wallace Going, DO;  Location: [facility];  Service: Plastics;  Laterality: Right;  . APPLICATION OF WOUND VAC Right 10/30/2016   Procedure: APPLICATION OF WOUND VAC;  Surgeon: Wallace Going, DO;  Location: [facility];  Service: Plastics;  Laterality: Right;  . APPLICATION OF WOUND VAC Right  12/01/2016   Procedure: APPLICATION OF WOUND VAC;  Surgeon: Wallace Going, DO;  [facility]: [facility];  Service: Plastics;  Laterality: Right;  . BONE BIOPSY Left 01/04/2018   Procedure: LEFT FOOT BONE BIOPSY;  Surgeon: [name], DPM;  Location: [facility];  Service: Podiatry;  Laterality: Left;  . CIRCUMCISION  2006  . DEBRIDEMENT  FOOT Right 07/16/2017  . I&D EXTREMITY Right 09/18/2016   Procedure: IRRIGATION AND DEBRIDEMENT OF RIGHT LATERAL DIABLTIC FOOT ULCER;  Surgeon: Wallace Going, DO;  Location: [place];  Service: Plastics;  Laterality: Right;  . I&D EXTREMITY Right 03/11/2017   Procedure: IRRIGATION AND DEBRIDEMENT OF RIGHT LATERAL FOOT WOUND WITH CELLERATE COLLAGEN;  Surgeon: Wallace Going, DO;  Location: [place];  Service: Plastics;  Laterality: Right;  . INCISION AND DRAINAGE OF WOUND Right 08/13/2016   Procedure: IRRIGATION AND DEBRIDEMENT WOUND RIGHT FOOT;  Surgeon: Wallace Going, DO;  [facility];  Service: Plastics;  Laterality: Right;  . LAPAROSCOPIC CHOLECYSTECTOMY  09-09-2006   dr Zella Richer  . LOWER EXTREMITY ANGIOGRAPHY N/A 10/27/2016   Procedure: Lower Extremity Angiography;  Surgeon: Angelia Mould, MD;  Location: [facility];  Service: Cardiovascular;  Laterality: N/A;  . MASS EXCISION Right 10/30/2016   Procedure: EXCISION RIGHT FOOT WOUND WITH VAC PLACEMENT;  Surgeon: Wallace Going, DO;  Location: [facility] [place];  Service: Plastics;  Laterality: Right;  . MASS EXCISION Right 07/16/2017   Procedure: EXCISION OF RIGHT FOOT WOUND WITH CELLERATE PLACEMENT;  Surgeon: Wallace Going, DO;  Location: [facility];  Service: Plastics;  Laterality: Right;  . METATARSAL HEAD EXCISION Left 01/04/2018   Procedure: 5TH METATARSAL RESECTION,  WOUND CLOSURE,  Adjacent Tissue Transfer.;  Surgeon: [name], DPM;  Location: [facility];  Service: Podiatry;  Laterality:  Left;  . SKIN SPLIT GRAFT Right 12/01/2016   Procedure: SKIN GRAFT SPLIT THICKNESS TO RIGHT LATERAL FOOT WOUND 9 X 3 CM;  Surgeon: Wallace Going, DO;  [facility];  Service: Plastics;  Laterality: Right;  . wisdom teeth extracted  1984    Family History  Problem Relation Age of Onset  . Stroke Father        early 8s. former smoker  . Diabetes Father   . Hypertension Father   . Sarcoidosis Mother   . Hypertension Sister   . Healthy Daughter   . Diabetes Paternal Grandmother   . Congenital heart disease Paternal Grandmother        died of complications    Medications- reviewed and updated Current Outpatient Medications  Medication Sig Dispense Refill  . aspirin EC 81 MG tablet Take 81 mg by mouth daily.    Marland Kitchen atorvastatin (LIPITOR) 40 MG tablet TAKE 1 1/2 TABLETS BY MOUTH EVERY MORNING 135 tablet 0  . BD VEO INSULIN SYRINGE U/F 31G X 15/64" 0.3 ML MISC USE TO INJECT INSULIN 4  TIMES DAILY 300 each 2  . Cyanocobalamin (VITAMIN B-12) 5000 MCG SUBL Place 5,000 mcg under the tongue daily.    . fenofibrate (TRICOR) 48 MG tablet TAKE 1 TABLET(48 MG TOTAL) BY MOUTH DAILY. 90 tablet 0  . ferrous sulfate 325 (65 FE) MG tablet Take 1 tablet (325 mg total) by mouth 2 (two) times daily with a meal. 60 tablet 3  . glucose blood (ACCU-CHEK GUIDE) test strip Dx E11.49 - Use to check blood glucose three times daily or as directed. 300 each 1  . hydrochlorothiazide (MICROZIDE) 12.5 MG capsule Take 1 capsule (12.5 mg total) by mouth every morning. (Patient taking differently: Take 12.5 mg by mouth daily. In the morning.) 90 capsule 3  . insulin aspart (NOVOLOG) 100 UNIT/ML injection Inject 15-25 units nto the skin 2 (two) times daily 50 mL 3  . Insulin Degludec (TRESIBA) 100 UNIT/ML SOLN Inject 64 Units into the skin at bedtime. 60 mL 3  . Insulin Syringe-Needle U-100 (BD INSULIN SYRINGE U/F) 31G X 5/16" 0.5 ML MISC Use to inject insulin 4 times daily 100 each 4  . Lancets MISC USED TO CHECK  BLOOD GLUCOSE 3-4 TIMES DAILY 200 each 3  . Multiple Vitamin (MULTIVITAMIN WITH MINERALS) TABS tablet Take 1 tablet by mouth 2 (two) times daily.    . Omega-3 Fatty Acids (FISH OIL) 1360 MG CAPS Take 1,360 mg by mouth daily.     Marland Kitchen pyridoxine (B-6) 100 MG tablet Take 100 mg by mouth 2 (two) times daily.    . ramipril (ALTACE) 5 MG capsule TAKE 1 CAPSULE(5 MG TOTAL) BY MOUTH EVERY MORNING. 90 capsule 0  . sitaGLIPtin (JANUVIA) 50 MG tablet Take 1 tablet (50 mg total) by mouth every morning. 90 tablet 3   No current facility-administered medications for this visit.     Allergies-reviewed and updated No Known Allergies  Social History   Social History Narrative   Married. 47 year old daughter 10/2016.       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       Hobbies: drawing, archery, tinkering     Objective:  BP 122/84 (BP Location: Left Arm, Patient Position: Sitting, Cuff Size: Large)   Pulse 72   Temp 98 F (36.7 C) (Temporal)   Ht 6' (1.829 m)   Wt (!) 304 lb 9.6 oz (138.2 kg)   SpO2 98%   BMI 41.31 kg/m  Gen: NAD, resting comfortably HEENT: Mucous membranes are moist. Oropharynx normal Neck: no thyromegaly CV: RRR no murmurs rubs or gallops Lungs: CTAB no crackles, wheeze, rhonchi Abdomen: soft/nontender/nondistended/normal bowel sounds.  Ext: no edema Skin: warm, dry Neuro: grossly normal, moves all extremities, PERRLA Rectal: normal tone, normal sized prostate, no masses or tenderness   Diabetic Foot Exam - Simple   Simple Foot Form Diabetic Foot exam was performed with the following findings: Yes 10/15/2018  9:01 AM  Visual Inspection See comments: Yes-4th and 5th ray amputation- callous on side of right foot at site of prior ulcers. Callous under MTP joint 5th toe- plans to return to see podiatry  Sensation Testing See comments: Yes Pulse Check Posterior Tibialis and Dorsalis pulse  intact bilaterally: Yes Comments        Assessment and Plan:  48 y.o. male presenting for annual physical.  Health Maintenance counseling: 1. Anticipatory guidance: Patient counseled regarding regular dental exams -q6 months, eye exams - q6 months for retinopathy,  avoiding  smoking and second hand smoke , limiting alcohol to 2 beverages per day- doesn't drink.   2. Risk factor reduction:  Advised patient of need for regular exercise and diet rich and fruits and vegetables to reduce risk of heart attack and stroke. Exercise-  Walking some- daily with goal 12 k steps- started middle of July- had been struggling prior with regular exercise. Diet- Obesity noted-up 34 pounds since January unfortunately but already beginning to reverse this trend- improved exercise as above- went back to stricter diet trying to limit to around 1600 calories per day- low carb, more veggies. Does 5x a day Wt Readings from Last 3 Encounters:  10/15/18 (!) 304 lb 9.6 oz (138.2 kg)  02/04/18 270 lb 12.8 oz (122.8 kg)  01/04/18 265 lb (120.2 kg)  3. Immunizations/screenings/ancillary studies flu shot today  Immunization History  Administered Date(s) Administered  . Influenza, High Dose Seasonal PF 10/06/2017  . Influenza,inj,Quad PF,6+ Mos 11/17/2016  . Pneumococcal Polysaccharide-23 02/02/2017  . Tdap 09/24/2007, 10/06/2017  4. Prostate cancer screening- will screen early due to being black male. Will get baseline rectal- normal. No nocturia.    5. Colon cancer screening - will refer for colonoscopy per american cancer society guidelines- he will check with his insurance to make sure covered- otherwise will do at 50 6. Skin cancer screening/prevention- lower risk due to melanin content in skin. advised regular sunscreen use- he minimizes exposure. Denies worrisome, changing, or new skin lesions.  7. Testicular cancer screening- advised monthly self exams  8. STD screening- patient opts out- monogomus 9. Never smoker   Status of chronic or acute concerns  Diabetes/neuropathy/Retinopathy - Taking Novolog 5-20 units BID- recently has been having to use 22 with weight up, Tresiba 20 units qhs in past- now up to 64 daily, and Januvia 50 mg daily. Checking BG at home, 70-224, avg 110. None below 70- able to correct and get this up.  Trying to eat better after pandemic. Walking 20 mins daily.   - had been taken off metformin by prior PCP- we could revert back to this from [place] but since he is doing well wanted to maintain current dose for now Lab Results  Component Value Date   HGBA1C 6.1 (A) 10/15/2018   HGBA1C 5.6 02/04/2018   HGBA1C 6.6 (H) 10/06/2017  - Reports retinopathy stable-continue ophthalmology follow-up - Diabetic foot ulcer-has finally healed. Working to keep callus under control.  - Diabetic neuropathy-contributed to diabetic wound.  Advised daily monitoring of feet.   HTN/CKD III - Taking HCTZ 12.5 mg daily and Ramipril 5 mg daily. Not checking at home. Denies HA, dizziness, CP, SOB, visual changes. Salt in moderation.  Good control blood pressure today. -Update BMP with labs.  Patient knows to avoid NSAIDs   Dyslipidemia - Taking Atorvastatin 40 mg 1.5 tablets daily and Fenofibrate 48 mg daily. Reports occasional hand cramps- discussed could be statin or could try mustard- he is able to work through it- will let me know if worsens- only on left hand.  Hopefully well-controlled-update lipid panel  Anemia/B12 Deficiency - Taking B12 and Ferrous Sulfate supplements.  We will check both iron and B12 today. - iron deficiency is another good reason to get colonoscopy  Recommended follow up: 4 month  Lab/Order associations: fasting   ICD-10-CM   1. Preventative health care  Z00.00 Vitamin B12    IBC + Ferritin    CBC    Lipid panel    Comprehensive metabolic panel    PSA  Ambulatory referral to Gastroenterology  2. Type II diabetes mellitus with neurological manifestations (HCC)  E11.49  POCT glycosylated hemoglobin (Hb A1C)  3. Chronic kidney disease (CKD), stage III (moderate) (HCC)  N18.3   4. Essential hypertension  I10   5. Dyslipidemia  E78.5 CBC    Lipid panel    Comprehensive metabolic panel  6. B12 deficiency  E53.8 Vitamin B12  7. Iron deficiency anemia, unspecified iron deficiency anemia type  D50.9 IBC + Ferritin  8. Screening for prostate cancer  Z12.5 PSA  9. Screen for colon cancer  Z12.11 Ambulatory referral to Gastroenterology    Meds ordered this encounter  Medications  . insulin aspart (NOVOLOG) 100 UNIT/ML injection    Sig: Inject 15-25 units nto the skin 2 (two) times daily    Dispense:  50 mL    Refill:  3    3 month supply- if can only give 1 month supply- please give 20 Ml/2 vials  . Insulin Degludec (TRESIBA) 100 UNIT/ML SOLN    Sig: Inject 64 Units into the skin at bedtime.    Dispense:  60 mL    Refill:  3    90 day supply please- can give 2 vials if only can give 1 month supply  . Lancets MISC    Sig: USED TO CHECK BLOOD GLUCOSE 3-4 TIMES DAILY    Dispense:  200 each    Refill:  3    DX. E11.9. Please dispense per pt insurance- generic is fine.   Return precautions advised.   [name], MD

## 2018-10-15 ENCOUNTER — Ambulatory Visit (INDEPENDENT_AMBULATORY_CARE_PROVIDER_SITE_OTHER): Payer: BC Managed Care – PPO | Admitting: Family Medicine

## 2018-10-15 ENCOUNTER — Other Ambulatory Visit: Payer: Self-pay

## 2018-10-15 ENCOUNTER — Encounter: Payer: Self-pay | Admitting: Family Medicine

## 2018-10-15 VITALS — BP 122/84 | HR 72 | Temp 98.0°F | Ht 72.0 in | Wt 304.6 lb

## 2018-10-15 DIAGNOSIS — Z0000 Encounter for general adult medical examination without abnormal findings: Secondary | ICD-10-CM | POA: Diagnosis not present

## 2018-10-15 DIAGNOSIS — E11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ical complication: Secondary | ICD-10-CM

## 2018-10-15 DIAGNOSIS — Z23 Encounter for immunization: Secondary | ICD-10-CM

## 2018-10-15 DIAGNOSIS — E785 Hyperlipidemia, unspecified: Secondary | ICD-10-CM | POA: Diagnosis not present

## 2018-10-15 DIAGNOSIS — Z125 Encounter for screening for malignant neoplasm of prostate: Secondary | ICD-10-CM | POA: Diagnosis not present

## 2018-10-15 DIAGNOSIS — I10 Essential (primary) hypertension: Secondary | ICD-10-CM | POA: Diagnosis not present

## 2018-10-15 DIAGNOSIS — N183 Chronic kidney disease, stage 3 unspecified: Secondary | ICD-10-CM

## 2018-10-15 DIAGNOSIS — D509 Iron deficiency anemia, unspecified: Secondary | ICD-10-CM

## 2018-10-15 DIAGNOSIS — E538 Deficiency of other specified B group vitamins: Secondary | ICD-10-CM

## 2018-10-15 DIAGNOSIS — Z1211 Encounter for screening for malignant neoplasm of colon: Secondary | ICD-10-CM

## 2018-10-15 LAB — LIPID PANEL
Cholesterol: 145 mg/dL (ref 0–200)
HDL: 40.9 mg/dL (ref >39.00)
LDL Cholesterol: 82 mg/dL (ref 0–99)
NonHDL: 104.11
Total CHOL/HDL Ratio: 4
Triglycerides: 112 mg/dL (ref 0.0–149.0)
VLDL: 22.4 mg/dL (ref 0.0–40.0)

## 2018-10-15 LAB — COMPREHENSIVE METABOLIC PANEL
ALT: 48 U/L (ref 0–53)
AST: 34 U/L (ref 0–37)
Albumin: 4.5 g/dL (ref 3.5–5.2)
Alkaline Phosphatase: 66 U/L (ref 39–117)
BUN: 22 mg/dL (ref 6–23)
CO2: 30 mEq/L (ref 19–32)
Calcium: 10.2 mg/dL (ref 8.4–10.5)
Chloride: 101 mEq/L (ref 96–112)
Creatinine, Ser: 1.59 mg/dL — ABNORMAL HIGH (ref 0.40–1.50)
GFR: 56.39 mL/min — ABNORMAL LOW (ref >60.00)
Glucose, Bld: 84 mg/dL (ref 70–99)
Potassium: 3.8 mEq/L (ref 3.5–5.1)
Sodium: 139 mEq/L (ref 135–145)
Total Bilirubin: 0.8 mg/dL (ref 0.2–1.2)
Total Protein: 7.9 g/dL (ref 6.0–8.3)

## 2018-10-15 LAB — CBC
HCT: 39.6 % (ref 39.0–52.0)
Hemoglobin: 13 g/dL (ref 13.0–17.0)
MCHC: 32.9 g/dL (ref 30.0–36.0)
MCV: 80.6 fl (ref 78.0–100.0)
Platelets: 305 10*3/uL (ref 150.0–400.0)
RBC: 4.92 Mil/uL (ref 4.22–5.81)
RDW: 13.1 % (ref 11.5–15.5)
WBC: 8 10*3/uL (ref 4.0–10.5)

## 2018-10-15 LAB — IBC + FERRITIN
Ferritin: 281.9 ng/mL (ref 22.0–322.0)
Iron: 50 ug/dL (ref 42–165)
Saturation Ratios: 15.7 % — ABNORMAL LOW (ref 20.0–50.0)
Transferrin: 228 mg/dL (ref 212.0–360.0)

## 2018-10-15 LAB — VITAMIN B12: Vitamin B-12: >1500 pg/mL — ABNORMAL HIGH (ref 211–911)

## 2018-10-15 LAB — POCT GLYCOSYLATED HEMOGLOBIN (HGB A1C): Hemoglobin A1C: 6.1 % — AB (ref 4.0–5.6)

## 2018-10-15 LAB — PSA: PSA: 0.46 ng/mL (ref 0.10–4.00)

## 2018-10-15 MED ORDER — LANCETS MISC: 3 refills | Status: DC

## 2018-10-15 MED ORDER — INSULIN ASPART 100 UNIT/ML ~~LOC~~ SOLN: SUBCUTANEOUS | 3 refills | Status: DC

## 2018-10-15 MED ORDER — TRESIBA 100 UNIT/ML ~~LOC~~ SOLN: 64.0000 [IU] | Freq: Every day | SUBCUTANEOUS | 3 refills | Status: DC

## 2018-10-15 NOTE — Addendum Note (Signed)
Addended by: Jasper Loser on: 10/15/2018 09:50 AM   Modules accepted: Orders

## 2018-10-21 ENCOUNTER — Other Ambulatory Visit: Payer: Self-pay

## 2018-10-21 ENCOUNTER — Ambulatory Visit: Payer: BC Managed Care – PPO | Admitting: Podiatry

## 2018-10-21 ENCOUNTER — Ambulatory Visit (INDEPENDENT_AMBULATORY_CARE_PROVIDER_SITE_OTHER): Payer: BC Managed Care – PPO

## 2018-10-21 DIAGNOSIS — Z89421 Acquired absence of other right toe(s): Secondary | ICD-10-CM

## 2018-10-21 DIAGNOSIS — E1142 Type 2 diabetes mellitus with diabetic polyneuropathy: Secondary | ICD-10-CM

## 2018-10-21 DIAGNOSIS — M79672 Pain in left foot: Secondary | ICD-10-CM

## 2018-10-21 DIAGNOSIS — M21962 Unspecified acquired deformity of left lower leg: Secondary | ICD-10-CM

## 2018-10-21 NOTE — Progress Notes (Signed)
Subjective:  Patient ID: [name], male    DOB: March 28, 1970,  MRN: [id number]  Chief Complaint  Patient presents with  . Post-op Problem    Pt states developed callous at left foot plantar aspect, at previous surgical site/    48 y.o. male returns for post-op check.  Having new callus formation to the plantar aspect of the left foot denies pain.  Review of Systems: Negative except as noted in the HPI. Denies N/V/F/Ch.  Past Medical History:  Diagnosis Date  . Atherosclerosis    Right lower extermity with  ulceration  . CKD (chronic kidney disease), stage III [facility])    nephrologist-  dr [name] ([facility] in [place])    . Diabetic foot ulcer with osteomyelitis ([place])    right lateral foot--- s/p  amputation 4th and 5th ray , ACELL , Wound VAC  . Diabetic peripheral neuropathy ([place])   . Elevated LFTs   . Foot ulcer ([place])    left foot  . Hypertension   . Iron deficiency anemia   . Mixed hyperlipidemia   . Obesity (BMI 30-39.9)   . 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 Type 2 diabetes mellitus treated with insulin ([place])    last A1c 6.2 on 06/02/2017  . Vitamin B 12 deficiency     Current Outpatient Medications:  .  aspirin EC 81 MG tablet, Take 81 mg by mouth daily., Disp: , Rfl:  .  atorvastatin (LIPITOR) 40 MG tablet, TAKE 1 1/2 TABLETS BY MOUTH EVERY MORNING, Disp: 135 tablet, Rfl: 0 .  BD VEO INSULIN SYRINGE U/F 31G X 15/64" 0.3 ML MISC, USE TO INJECT INSULIN 4 TIMES DAILY, Disp: 300 each, Rfl: 2 .  Cyanocobalamin (VITAMIN B-12) 5000 MCG SUBL, Place 5,000 mcg under the tongue daily., Disp: , Rfl:  .  fenofibrate (TRICOR) 48 MG tablet, TAKE 1 TABLET(48 MG TOTAL)  BY MOUTH DAILY., Disp: 90 tablet, Rfl: 0 .  ferrous sulfate 325 (65 FE) MG tablet, Take 1 tablet (325 mg total) by mouth 2 (two) times daily with a meal., Disp: 60 tablet, Rfl: 3 .  glucose blood (ACCU-CHEK GUIDE) test strip, Dx E11.49 - Use to check blood glucose three times daily or as directed., Disp: 300 each, Rfl: 1 .  HUMALOG 100 UNIT/ML injection, INJ 5 TO 20 UNITS [place] BID, Disp: , Rfl:  .  hydrochlorothiazide (MICROZIDE) 12.5 MG capsule, Take 1 capsule (12.5 mg total) by mouth every morning. (Patient taking differently: Take 12.5 mg by mouth daily. In the morning.), Disp: 90 capsule, Rfl: 3 .  insulin aspart (NOVOLOG) 100 UNIT/ML injection, Inject 15-25 units nto the skin 2 (two) times daily, Disp: 50 mL, Rfl: 3 .  Insulin Degludec (TRESIBA) 100 UNIT/ML SOLN, Inject 64 Units into the skin at bedtime., Disp: 60 mL, Rfl: 3 .  Insulin Syringe-Needle U-100 (BD INSULIN SYRINGE U/F) 31G X 5/16" 0.5 ML MISC, Use to inject insulin 4 times daily, Disp: 100 each, Rfl: 4 .  Lancets MISC, USED TO CHECK BLOOD GLUCOSE 3-4 TIMES DAILY, Disp: 200 each, Rfl: 3 .  Multiple Vitamin (MULTIVITAMIN WITH MINERALS) TABS tablet, Take 1 tablet by mouth 2 (two) times daily., Disp: , Rfl:  .  Omega-3 Fatty Acids (FISH OIL) 1360 MG CAPS, Take 1,360 mg by mouth daily. , Disp: , Rfl:  .  pyridoxine (B-6) 100 MG tablet, Take 100 mg by mouth 2 (two) times daily., Disp: , Rfl:  .  ramipril (ALTACE) 5 MG capsule, TAKE 1 CAPSULE(5 MG TOTAL) BY MOUTH EVERY MORNING., Disp: 90 capsule, Rfl: 0 .  sitaGLIPtin (JANUVIA) 50 MG tablet, Take 1 tablet (50 mg total) by mouth every morning., Disp: 90 tablet, Rfl: 3  Social History   Tobacco Use  Smoking Status Never Smoker  Smokeless Tobacco Never Used    No Known Allergies Objective:  There were no vitals filed for this visit. There is no height or weight on file to calculate BMI. Constitutional Well developed. Well nourished.  Vascular Foot warm and well perfused. Capillary  refill normal to all digits.   Neurologic Normal speech. Oriented to person, place, and time. Epicritic sensation to light touch grossly present bilaterally.  Dermatologic  hyperkeratosis sub-met 4 left  Orthopedic: Partial foot amputation right - 4th/5th rays.   Radiographs: Taken and reviewed status post left fifth metatarsal resection with slight heterotrophic ossification noted.  Vascular calcifications noted Assessment:   1. Metatarsal deformity, left   2. History of partial amputation of toe of right foot ([place])   3. DM type 2 with diabetic peripheral neuropathy ([place])    Plan:  Patient was evaluated and treated and all questions answered.  Pre-ulcerative wound left sub-met 4; hyperkeratosis right lateral foot with history of partial amputation -Debrided no open ulceration noted   Partial Foot amputation right -Will fabricate new DM inserts, right lateral foot filler, possible shoes due to ulceration, hx of partial foot multiple ray amputations.  Appointment made for patient   Return in about 4 weeks (around 11/18/2018).

## 2018-10-26 ENCOUNTER — Other Ambulatory Visit: Payer: Self-pay | Admitting: Podiatry

## 2018-10-26 DIAGNOSIS — M21962 Unspecified acquired deformity of left lower leg: Secondary | ICD-10-CM

## 2018-11-14 ENCOUNTER — Other Ambulatory Visit: Payer: Self-pay | Admitting: Family Medicine

## 2018-11-15 ENCOUNTER — Encounter: Payer: Self-pay | Admitting: Family Medicine

## 2018-12-02 ENCOUNTER — Ambulatory Visit: Payer: BC Managed Care – PPO | Admitting: Podiatry

## 2018-12-02 ENCOUNTER — Other Ambulatory Visit: Payer: Self-pay

## 2018-12-02 DIAGNOSIS — B351 Tinea unguium: Secondary | ICD-10-CM

## 2018-12-02 DIAGNOSIS — L84 Corns and callosities: Secondary | ICD-10-CM | POA: Diagnosis not present

## 2018-12-02 DIAGNOSIS — E1142 Type 2 diabetes mellitus with diabetic polyneuropathy: Secondary | ICD-10-CM | POA: Diagnosis not present

## 2018-12-13 ENCOUNTER — Other Ambulatory Visit: Payer: Self-pay

## 2018-12-13 ENCOUNTER — Ambulatory Visit: Payer: BC Managed Care – PPO | Admitting: Orthotics

## 2018-12-13 DIAGNOSIS — Z89421 Acquired absence of other right toe(s): Secondary | ICD-10-CM

## 2018-12-13 DIAGNOSIS — E1142 Type 2 diabetes mellitus with diabetic polyneuropathy: Secondary | ICD-10-CM

## 2018-12-13 DIAGNOSIS — M21962 Unspecified acquired deformity of left lower leg: Secondary | ICD-10-CM

## 2018-12-13 NOTE — Progress Notes (Signed)
Had long discussion with patient; patient doesn't feel he needs any f/o as he isn't on his feet as much in past; in fact, he said he had no idea why he was seeing me today as he feels his foot has healed fine and has no issues.    I told him to return to Dr. March Rummage in the future if he has any skin breadown or issues; as I wasn't going to force the issue of f/o on him as he didn't feel the need.

## 2019-01-17 LAB — BASIC METABOLIC PANEL
BUN: 14 (ref 4–21)
Chloride: 104 (ref 99–108)
Creatinine: 1.7 — AB (ref 0.6–1.3)
Glucose: 99

## 2019-01-17 LAB — HEPATIC FUNCTION PANEL
ALT: 57 — AB (ref 10–40)
AST: 43 — AB (ref 14–40)
Alkaline Phosphatase: 75 (ref 25–125)

## 2019-01-17 LAB — LIPID PANEL
Cholesterol: 140 (ref 0–200)
LDL Cholesterol: 81

## 2019-01-17 LAB — HEMOGLOBIN A1C: Hemoglobin A1C: 6.4

## 2019-01-17 LAB — IRON,TIBC AND FERRITIN PANEL
Ferritin: 380
Iron: 61

## 2019-01-17 LAB — COMPREHENSIVE METABOLIC PANEL
Albumin: 4.2 (ref 3.5–5.0)
Calcium: 9.7 (ref 8.7–10.7)
GFR calc Af Amer: 57
GFR calc non Af Amer: 47

## 2019-01-17 LAB — CBC AND DIFFERENTIAL
HCT: 42 (ref 41–53)
Hemoglobin: 12.9 — AB (ref 13.5–17.5)
WBC: 10.3

## 2019-01-17 LAB — CBC: RBC: 5.03 (ref 3.87–5.11)

## 2019-01-31 NOTE — Progress Notes (Signed)
Subjective:  Patient ID: [name], male    DOB: 07-19-1970,  MRN: [id number]  Chief Complaint  Patient presents with  . Foot Pain    pt is here for a f/u on an ulcerative callus, pt states that he is doing alot better, pt is also not showing any signs of infection, pt has also been applying lotion to calluses as well    49 y.o. male returns for post-op check.  Hx as above.  Review of Systems: Negative except as noted in the HPI. Denies N/V/F/Ch.  Past Medical History:  Diagnosis Date  . Atherosclerosis    Right lower extermity with  ulceration  . CKD (chronic kidney disease), stage III [facility])    nephrologist-  dr [name] ([facility] in [place])    . Diabetic foot ulcer with osteomyelitis ([place])    right lateral foot--- s/p  amputation 4th and 5th ray , ACELL , Wound VAC  . Diabetic peripheral neuropathy ([place])   . Elevated LFTs   . Foot ulcer ([place])    left foot  . Hypertension   . Iron deficiency anemia   . Mixed hyperlipidemia   . Obesity (BMI 30-39.9)   . 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 Type 2 diabetes mellitus treated with insulin ([place])    last A1c 6.2 on 06/02/2017  . Vitamin B 12 deficiency     Current Outpatient Medications:  .  aspirin EC 81 MG tablet, Take 81 mg by mouth daily., Disp: , Rfl:  .  atorvastatin (LIPITOR) 40 MG tablet, TAKE 1 1/2 TABLETS BY MOUTH EVERY MORNING, Disp: 135 tablet, Rfl: 0 .  BD VEO INSULIN SYRINGE U/F 31G X 15/64" 0.3 ML MISC, USE TO INJECT INSULIN 4 TIMES DAILY, Disp: 300 each, Rfl: 2 .  Cyanocobalamin (VITAMIN B-12) 5000 MCG SUBL, Place 5,000 mcg under the tongue daily., Disp: , Rfl:  .  fenofibrate (TRICOR) 48 MG  tablet, TAKE 1 TABLET(48 MG) BY MOUTH DAILY, Disp: 90 tablet, Rfl: 0 .  ferrous sulfate 325 (65 FE) MG tablet, Take 1 tablet (325 mg total) by mouth 2 (two) times daily with a meal., Disp: 60 tablet, Rfl: 3 .  glucose blood (ACCU-CHEK GUIDE) test strip, Dx E11.49 - Use to check blood glucose three times daily or as directed., Disp: 300 each, Rfl: 1 .  HUMALOG 100 UNIT/ML injection, INJ 5 TO 20 UNITS [place] BID, Disp: , Rfl:  .  hydrochlorothiazide (MICROZIDE) 12.5 MG capsule, Take 1 capsule (12.5 mg total) by mouth every morning. (Patient taking differently: Take 12.5 mg by mouth daily. In the morning.), Disp: 90 capsule, Rfl: 3 .  insulin aspart (NOVOLOG) 100 UNIT/ML injection, Inject 15-25 units nto the skin 2 (two) times daily, Disp: 50 mL, Rfl: 3 .  Insulin Degludec (TRESIBA) 100 UNIT/ML SOLN, Inject 64 Units into the skin at bedtime., Disp: 60 mL, Rfl: 3 .  Insulin Syringe-Needle U-100 (BD INSULIN SYRINGE U/F) 31G X 5/16" 0.5 ML MISC, Use to inject insulin 4 times daily, Disp: 100 each, Rfl: 4 .  Lancets MISC, USED TO CHECK BLOOD GLUCOSE 3-4 TIMES DAILY, Disp: 200 each, Rfl: 3 .  Multiple Vitamin (MULTIVITAMIN WITH MINERALS) TABS tablet, Take 1 tablet  by mouth 2 (two) times daily., Disp: , Rfl:  .  Omega-3 Fatty Acids (FISH OIL) 1360 MG CAPS, Take 1,360 mg by mouth daily. , Disp: , Rfl:  .  pyridoxine (B-6) 100 MG tablet, Take 100 mg by mouth 2 (two) times daily., Disp: , Rfl:  .  ramipril (ALTACE) 5 MG capsule, TAKE 1 CAPSULE(5 MG) BY MOUTH EVERY MORNING, Disp: 90 capsule, Rfl: 0 .  sitaGLIPtin (JANUVIA) 50 MG tablet, Take 1 tablet (50 mg total) by mouth every morning., Disp: 90 tablet, Rfl: 3  Social History   Tobacco Use  Smoking Status Never Smoker  Smokeless Tobacco Never Used    No Known Allergies Objective:  There were no vitals filed for this visit. There is no height or weight on file to calculate BMI. Constitutional Well developed. Well nourished.  Vascular Foot warm and well  perfused. Capillary refill normal to all digits.   Neurologic Normal speech. Oriented to person, place, and time. Epicritic sensation to light touch grossly present bilaterally.  Dermatologic  hyperkeratosis sub-met 4 left  Orthopedic: Partial foot amputation right - 4th/5th rays.   Assessment:   1. DM type 2 with diabetic peripheral neuropathy ([place])   2. Onychomycosis   3. Callus    Plan:  Patient was evaluated and treated and all questions answered.  Diabetes with Amputation hx, Onychomycosis -Educated on diabetic footcare. Diabetic risk level 3 -Nails x10 debrided sharply and manually with large nail nipper and rotary burr.  -Callus debrided right foot   Procedure: Nail Debridement Rationale: Patient meets criteria for routine foot care due to amputation hx Type of Debridement: manual, sharp debridement. Instrumentation: Nail nipper, rotary burr. Number of Nails: 8  Procedure: Paring of Lesion Rationale: painful hyperkeratotic lesion Type of Debridement: manual, sharp debridement. Instrumentation: 312 blade Number of Lesions: 1    Return in about 3 months (around 03/04/2019).

## 2019-02-04 ENCOUNTER — Other Ambulatory Visit: Payer: Self-pay | Admitting: Family Medicine

## 2019-02-11 ENCOUNTER — Ambulatory Visit: Payer: BC Managed Care – PPO | Admitting: Podiatry

## 2019-02-11 ENCOUNTER — Other Ambulatory Visit: Payer: Self-pay

## 2019-02-11 DIAGNOSIS — E1142 Type 2 diabetes mellitus with diabetic polyneuropathy: Secondary | ICD-10-CM | POA: Diagnosis not present

## 2019-02-11 DIAGNOSIS — L84 Corns and callosities: Secondary | ICD-10-CM | POA: Diagnosis not present

## 2019-02-11 DIAGNOSIS — S90422A Blister (nonthermal), left great toe, initial encounter: Secondary | ICD-10-CM | POA: Diagnosis not present

## 2019-02-11 DIAGNOSIS — B351 Tinea unguium: Secondary | ICD-10-CM

## 2019-02-11 NOTE — Patient Instructions (Signed)
Recommend Urea cream 40% - available on [employer] Miracle cream available at checkout also is good for dryness and scaling.

## 2019-02-11 NOTE — Progress Notes (Signed)
  Subjective:  Patient ID: [name], male    DOB: [birth year],  MRN: [id number]  Chief Complaint  Patient presents with  . Callouses    Callous trim  . Foot Problem    Left 1st bruise, unknown injury, unknown duration, non-painful.   49 y.o. male presents with the above complaint. History confirmed with patient.   Objective:  Physical Exam: warm, good capillary refill, nail exam onychomycosis of the toenails, no trophic changes or ulcerative lesions, normal DP and PT pulses, and absent protective sensation Left Foot: normal exam, no swelling, tenderness, instability; ligaments intact, full range of motion of all ankle/foot joints. Left hallux dried blister. Right Foot: partial lateral ray amputation noted, multiple hyperkeratoses along lateral foot.  No images are attached to the encounter.  Assessment:   1. DM type 2 with diabetic peripheral neuropathy (HCC)   2. Callus   3. Onychomycosis      Plan:  Patient was evaluated and treated and all questions answered.  Onychomycosis, Diabetes and DPN -Patient is diabetic with a qualifying condition for at risk foot care.  Procedure: Nail Debridement Rationale: Patient meets criteria for routine foot care due to amputation hx Type of Debridement: manual, sharp debridement. Instrumentation: Nail nipper, rotary burr. Number of Nails: 6  Procedure: Paring of Lesion Rationale: painful hyperkeratotic lesion Type of Debridement: manual, sharp debridement. Instrumentation: 312 blade Number of Lesions: 3  Blister left hallux -Deroofed, no open ulceration noted. -Advised to call should the area worsen.  Return in about 9 weeks (around 04/15/2019) for Diabetic Foot Care.

## 2019-02-14 ENCOUNTER — Ambulatory Visit: Payer: BC Managed Care – PPO | Admitting: Family Medicine

## 2019-03-03 ENCOUNTER — Encounter: Payer: Self-pay | Admitting: Family Medicine

## 2019-03-03 ENCOUNTER — Ambulatory Visit (INDEPENDENT_AMBULATORY_CARE_PROVIDER_SITE_OTHER): Payer: BC Managed Care – PPO | Admitting: Family Medicine

## 2019-03-03 ENCOUNTER — Other Ambulatory Visit: Payer: Self-pay

## 2019-03-03 VITALS — BP 128/84 | HR 81 | Temp 97.9°F | Ht 72.0 in | Wt 300.0 lb

## 2019-03-03 DIAGNOSIS — E785 Hyperlipidemia, unspecified: Secondary | ICD-10-CM

## 2019-03-03 DIAGNOSIS — I152 Hypertension secondary to endocrine disorders: Secondary | ICD-10-CM

## 2019-03-03 DIAGNOSIS — I10 Essential (primary) hypertension: Secondary | ICD-10-CM | POA: Diagnosis not present

## 2019-03-03 DIAGNOSIS — E1169 Type 2 diabetes mellitus with other specified complication: Secondary | ICD-10-CM

## 2019-03-03 DIAGNOSIS — E1159 Type 2 diabetes mellitus with other circulatory complications: Secondary | ICD-10-CM

## 2019-03-03 DIAGNOSIS — N183 Chronic kidney disease, stage 3 unspecified: Secondary | ICD-10-CM | POA: Diagnosis not present

## 2019-03-03 DIAGNOSIS — E11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ical complication: Secondary | ICD-10-CM | POA: Diagnosis not present

## 2019-03-03 DIAGNOSIS — E11319 Type 2 diabetes mellitus with unspecified diabetic retinopathy without macular edema: Secondary | ICD-10-CM

## 2019-03-03 DIAGNOSIS — E1142 Type 2 diabetes mellitus with diabetic polyneuropathy: Secondary | ICD-10-CM

## 2019-03-03 DIAGNOSIS — Z89421 Acquired absence of other right toe(s): Secondary | ICD-10-CM

## 2019-03-03 MED ORDER — "BD VEO INSULIN SYRINGE U/F 31G X 15/64"" 0.3 ML MISC": 2 refills | Status: DC

## 2019-03-03 MED ORDER — SITAGLIPTIN PHOSPHATE 50 MG PO TABS: 50.0000 mg | ORAL_TABLET | Freq: Every morning | ORAL | 3 refills | Status: DC

## 2019-03-03 NOTE — Progress Notes (Addendum)
Phone [phone] In person visit   Subjective:   [name] is a 49 y.o. year old very pleasant male patient who presents for/with See problem oriented charting Chief Complaint  Patient presents with  . Diabetes   This visit occurred during the SARS-CoV-2 public health emergency.  Safety protocols were in place, including screening questions prior to the visit, additional usage of staff PPE, and extensive cleaning of exam room while observing appropriate contact time as indicated for disinfecting solutions.   Past Medical History-  Patient Active Problem List   Diagnosis Date Noted  . Morbid obesity ([place]) 03/08/2015    Priority: High  . Type II diabetes mellitus with neurological manifestations ([place]) 03/07/2015    Priority: High  . Diabetic retinopathy ([place]) 01/25/2018    Priority: Medium  . Chronic kidney disease (CKD), stage III (moderate) 11/17/2016    Priority: Medium  . Diabetic neuropathy ([place]) 11/17/2016    Priority: Medium  . Elevated LFTs     Priority: Medium  . Hyperlipidemia associated with type 2 diabetes mellitus ([place]) 03/08/2015    Priority: Medium  . Hypertension associated with diabetes ([place]) 03/08/2015    Priority: Medium  . Status post split thickness skin graft 12/08/2016    Priority: Low  . B12 deficiency 11/17/2016    Priority: Low  . Iron deficiency anemia 11/17/2016    Priority: Low  . 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 ascorbic acid (VITAMIN C) 500 MG tablet Take by mouth.    Marland Kitchen aspirin EC 81 MG tablet Take 81 mg by mouth daily.    Marland Kitchen atorvastatin (LIPITOR) 40 MG tablet TAKE 1 AND 1/2 TABLETS BY MOUTH EVERY MORNING 135 tablet 0  . Cyanocobalamin (VITAMIN B-12) 5000 MCG SUBL  Place 5,000 mcg under the tongue daily.    . fenofibrate (TRICOR) 48 MG tablet TAKE 1 TABLET(48 MG) BY MOUTH DAILY 90 tablet 0  . ferrous sulfate 325 (65 FE) MG tablet Take 1 tablet (325 mg total) by mouth 2 (two) times daily with a meal. 60 tablet 3  . glucose blood (ACCU-CHEK GUIDE) test strip Dx E11.49 - Use to check blood glucose three times daily or as directed. 300 each 1  . hydrochlorothiazide (MICROZIDE) 12.5 MG capsule Take 1 capsule (12.5 mg total) by mouth daily. In the morning. 90 capsule 0  . insulin aspart (NOVOLOG) 100 UNIT/ML injection Inject 15-25 units nto the skin 2 (two) times daily (Patient taking differently: Inject 15-25 units nto the skin 2 (two) times daily currently 15 u bid) 50 mL 3  . Insulin Degludec (TRESIBA) 100 UNIT/ML SOLN Inject 64 Units into the skin at bedtime. 60 mL 3  . Insulin Syringe-Needle U-100 (BD INSULIN SYRINGE U/F) 31G X 5/16" 0.5 ML MISC Use to inject insulin 4 times daily 100 each 4  . Insulin Syringe-Needle U-100 (BD VEO INSULIN SYRINGE U/F) 31G X 15/64" 0.3 ML MISC USE TO INJECT INSULIN 4 TIMES DAILY 300 each 2  . Lancets MISC USED TO CHECK BLOOD GLUCOSE 3-4 TIMES DAILY 200 each 3  . Multiple Vitamin (MULTIVITAMIN WITH MINERALS) TABS tablet Take 1 tablet by mouth 2 (two) times daily.    . Omega-3 Fatty Acids (FISH OIL) 1360  MG CAPS Take 1,360 mg by mouth daily.     Marland Kitchen pyridoxine (B-6) 100 MG tablet Take 100 mg by mouth 2 (two) times daily.    . ramipril (ALTACE) 5 MG capsule TAKE 1 CAPSULE(5 MG) BY MOUTH EVERY MORNING 90 capsule 0  . sitaGLIPtin (JANUVIA) 50 MG tablet Take 1 tablet (50 mg total) by mouth every morning. 90 tablet 3  . HUMALOG 100 UNIT/ML injection INJ 5 TO 20 UNITS [place] BID     No current facility-administered medications for this visit.     Objective:  BP 128/84   Pulse 81   Temp 97.9 F (36.6 C) (Temporal)   Ht 6' (1.829 m)   Wt 300 lb (136.1 kg)   SpO2 96%   BMI 40.69 kg/m  Gen: NAD, resting comfortably CV: RRR no  murmurs rubs or gallops Lungs: CTAB no crackles, wheeze, rhonchi Ext: no edema Skin: warm, dry  EKG (appears to have movement on EKG from [facility]): sinus rhythm with rate 64, normal axis, normal intervals, no hypertrophy, no st or t wave changes     Assessment and Plan  # Diabetes/neuropathy/Retinopathy  S:currently taking Novolog 15 U bid, Tresiba 65 - 72U every am,  and Januvia50mg  every am. This morning fasting reading was 69. On average he states fasting blood sugars have been in range and some have been little low. Lowest one has been 43 on 16th of last month (states this was with a job transition- usually would wake up 3 AM and eat- he ended up not eating until 9 with family). Denies any symptoms with readings that are low.  - Reports retinopathy stable-continue ophthalmology follow-up Last seen in October. Followed by Dr. Peter Garter.  - Diabetic foot ulcer-has finally healed. Working to keep callus under control.  - Diabetic neuropathy-contributed to diabetic wound.  Advised daily monitoring of feet. Lab Results  Component Value Date   HGBA1C 6.4 01/17/2019   A/P: Overcontrolled with low morning CBGs - too soon for repeat a1c but encouraged patient to reduce Tresiba to 63 units in AM to help avoid lows. We want morning sugars between 80-120 ideally. Want to avoid 2 hour post meals over 180  -Could consider adding Metformin potentially if sugars increase  # HTN/CKD III S:He has been taking Ramipril 5 mg daily and HCTZ daily no issues. Does not check blood pressure at home.   GFR around 56 last check here- 57 with [facility] in december. Knows to avoid nsaids  BP Readings from Last 3 Encounters:  03/03/19 128/84  10/15/18 122/84  02/04/18 130/86  A/P: Stable issues x2. Continue current medications.    # Dyslipidemia S:Takes Lipitor 40mg  1 1/2 tablet dialy with no issues or side effects. Also on fenofibrate. Occasional myalgias but not as frequent- cramps in hands about once a week Lab  Results  Component Value Date   CHOL 140 01/17/2019   HDL 40.90 10/15/2018   LDLCALC 81 01/17/2019   LDLDIRECT 61.0 02/02/2017   TRIG 112.0 10/15/2018   CHOLHDL 4 10/15/2018  A/P: with being on fenofibrate and Lipitor 60 mg already did not feel strongly about increasing statin due to him having some myalgias  . Instead of increasing meds- continue lifestyle efforts with [facility]  # Anemia/B12 Deficiency S:Taking B12 and Ferrous Sulfate supplements. Lab Results  Component Value Date   VITAMINB12 >1500 (H) 10/15/2018   Lab Results  Component Value Date   FERRITIN 380 01/17/2019  A/P: Reasonable control last check-continue supplements  #  Obesity morbid S:working with [facility] to try to lose weight- he is down 4 lbs. He feels like this holds him accountable and helps. Working on increasing exercise- trying to get higher # of steps at work  [employer] from Last 3 Encounters:  03/03/19 300 lb (136.1 kg)  10/15/18 (!) 304 lb 9.6 oz (138.2 kg)  02/04/18 270 lb 12.8 oz (122.8 kg)  A/P: good job with a few lbs down despite the holidays -Encouraged need for healthy eating, regular exercise, weight loss.   # insurance wouldnt cover until age 40 colonoscopy  # ulcer on foot has healed. Still has callous and seeing podiatry to keep that trimmed down. Noted amputation right foot 4th and 5th ray  Elevated LFTs- very mild elevations but less than 2x ULN- continue to monitor- check next visit. Continue to work on weight loss- do not feel strongly about reducing statin unless increases Lab Results  Component Value Date   ALT 57 (A) 01/17/2019   AST 43 (A) 01/17/2019   ALKPHOS 75 01/17/2019   BILITOT 0.8 10/15/2018   # had EKG with [facility]- didn't appear to be best baseline. Will update today   Recommended follow up: physical in september Future Appointments  Date Time Provider [place]  04/21/2019  4:15 PM [name], DPM [facility]  10/19/2019  9:20 AM Yong Channel,  Brayton Mars, MD [facility]    Lab/Order associations:   ICD-10-CM   1. Type II diabetes mellitus with neurological manifestations ([place])  E11.49   2. Hypertension associated with diabetes ([place])  E11.59    I10   3. Hyperlipidemia associated with type 2 diabetes mellitus ([place])  E11.69    E78.5   4. Stage 3 chronic kidney disease, unspecified whether stage 3a or 3b CKD  N18.30   5. Morbid obesity ([place]) Chronic E66.01   6. Diabetic retinopathy of both eyes associated with type 2 diabetes mellitus, macular edema presence unspecified, unspecified retinopathy severity ([place])  E11.319   7. Diabetic polyneuropathy associated with type 2 diabetes mellitus (HCC)  E11.42   8. 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 sitaGLIPtin (JANUVIA) 50 MG tablet    Sig: Take 1 tablet (50 mg total) by mouth every morning.    Dispense:  90 tablet    Refill:  3  . Insulin Syringe-Needle U-100 (BD VEO INSULIN SYRINGE U/F) 31G X 15/64" 0.3 ML MISC    Sig: USE TO INJECT INSULIN 4 TIMES DAILY    Dispense:  300 each    Refill:  2   Return precautions advised.  [name], MD

## 2019-03-03 NOTE — Patient Instructions (Addendum)
Diabetes Overcontrolled with low morning blood sugars - too soon for repeat a1c but encouraged patient to reduce Tresiba to 63 units in AM to help avoid lows. We want morning sugars between 80-120 ideally. Want to avoid 2 hour post meals over 180  -Could consider adding Metformin potentially if sugars increase - if outside of this range please see [place] sooner but glad you also have check ins with [facility]  Team do EKG today- if normal then he will be free to go

## 2019-04-02 ENCOUNTER — Other Ambulatory Visit: Payer: Self-pay | Admitting: Family Medicine

## 2019-04-21 ENCOUNTER — Ambulatory Visit: Payer: BC Managed Care – PPO | Admitting: Podiatry

## 2019-04-21 ENCOUNTER — Other Ambulatory Visit: Payer: Self-pay

## 2019-04-21 DIAGNOSIS — B351 Tinea unguium: Secondary | ICD-10-CM

## 2019-04-21 DIAGNOSIS — E1142 Type 2 diabetes mellitus with diabetic polyneuropathy: Secondary | ICD-10-CM | POA: Diagnosis not present

## 2019-04-21 DIAGNOSIS — E1169 Type 2 diabetes mellitus with other specified complication: Secondary | ICD-10-CM | POA: Diagnosis not present

## 2019-04-25 ENCOUNTER — Telehealth: Payer: Self-pay

## 2019-04-25 NOTE — Telephone Encounter (Signed)
Handicapped Drivers Registration form dropped off. Placed form in the folder upfront.

## 2019-04-28 NOTE — Telephone Encounter (Signed)
I am up-to-date on monitor for other than his work from [place] reviewed the  to sign folder and it was not in there.  I guess we need to fill out another one

## 2019-04-28 NOTE — Telephone Encounter (Signed)
Have you seen this form or do I need to get one filled out?

## 2019-04-29 NOTE — Telephone Encounter (Signed)
We did not get this from do you have any idea where it is?

## 2019-04-29 NOTE — Telephone Encounter (Signed)
Started new form placed in your box for signature

## 2019-04-30 NOTE — Telephone Encounter (Signed)
Thanks--I signed this.

## 2019-04-30 NOTE — Telephone Encounter (Signed)
Thanks

## 2019-05-06 ENCOUNTER — Ambulatory Visit: Payer: BC Managed Care – PPO | Admitting: Podiatry

## 2019-05-06 ENCOUNTER — Encounter: Payer: Self-pay | Admitting: Podiatry

## 2019-05-06 ENCOUNTER — Other Ambulatory Visit: Payer: Self-pay

## 2019-05-06 VITALS — Temp 97.7°F

## 2019-05-06 DIAGNOSIS — S90425A Blister (nonthermal), left lesser toe(s), initial encounter: Secondary | ICD-10-CM

## 2019-05-06 DIAGNOSIS — S90422A Blister (nonthermal), left great toe, initial encounter: Secondary | ICD-10-CM | POA: Diagnosis not present

## 2019-05-06 MED ORDER — CEPHALEXIN 500 MG PO CAPS: 500.0000 mg | ORAL_CAPSULE | Freq: Two times a day (BID) | ORAL | 0 refills | Status: DC

## 2019-05-06 NOTE — Progress Notes (Signed)
  Subjective:  Patient ID: [name], male    DOB: [birth year],  MRN: [id number]  Chief Complaint  Patient presents with  . Wound Check    L foot - hallux and 2nd toe. x9 days. Pt stated, "I had a blister on the [hallux]. Went to work and felt drainage in my sock. When I took it off at home, the skin on top of my second toe was gone. I wonder if my steel-toed shoes are irritating them. No pain. The drainage was clear. No foul odors, fever/chills, or N&V".   49 y.o. male presents for wound care. Hx confirmed with patient.  Objective:  Physical Exam: Blisters left hallux and 2nd toe with bulla and viable tissue underneath post-deroofing of the blisters. No warmth, no erythema, no purulence, no signs of acute infection.  Assessment:   1. Blister of left great toe, initial encounter   2. Blister of second toe of left foot, initial encounter    Plan:  Patient was evaluated and treated and all questions answered.  Blisters hallux, 2nd toe -Rx keflex for ppx -Blisters deroofed and lesions debrided. Viable skin underneath. -Dressed with silvadene ointment and band-aid -Dress daily with abx ointment and band-aid.  Return in about 1 week (around 05/13/2019) for Blister check.

## 2019-05-13 ENCOUNTER — Other Ambulatory Visit: Payer: Self-pay

## 2019-05-13 ENCOUNTER — Ambulatory Visit (INDEPENDENT_AMBULATORY_CARE_PROVIDER_SITE_OTHER): Payer: BC Managed Care – PPO

## 2019-05-13 ENCOUNTER — Ambulatory Visit: Payer: BC Managed Care – PPO | Admitting: Podiatry

## 2019-05-13 DIAGNOSIS — S90425D Blister (nonthermal), left lesser toe(s), subsequent encounter: Secondary | ICD-10-CM

## 2019-05-13 DIAGNOSIS — S90422D Blister (nonthermal), left great toe, subsequent encounter: Secondary | ICD-10-CM

## 2019-05-13 DIAGNOSIS — M14671 Charcot's joint, right ankle and foot: Secondary | ICD-10-CM

## 2019-05-13 DIAGNOSIS — L03115 Cellulitis of right lower limb: Secondary | ICD-10-CM

## 2019-05-19 ENCOUNTER — Ambulatory Visit: Payer: BC Managed Care – PPO | Admitting: Podiatry

## 2019-05-19 ENCOUNTER — Other Ambulatory Visit: Payer: Self-pay

## 2019-05-19 ENCOUNTER — Ambulatory Visit (INDEPENDENT_AMBULATORY_CARE_PROVIDER_SITE_OTHER): Payer: BC Managed Care – PPO

## 2019-05-19 VITALS — Temp 96.3°F

## 2019-05-19 DIAGNOSIS — L03115 Cellulitis of right lower limb: Secondary | ICD-10-CM

## 2019-05-19 DIAGNOSIS — L97521 Non-pressure chronic ulcer of other part of left foot limited to breakdown of skin: Secondary | ICD-10-CM

## 2019-05-27 ENCOUNTER — Ambulatory Visit: Payer: BC Managed Care – PPO | Admitting: Podiatry

## 2019-05-27 ENCOUNTER — Other Ambulatory Visit: Payer: Self-pay

## 2019-05-27 DIAGNOSIS — L97521 Non-pressure chronic ulcer of other part of left foot limited to breakdown of skin: Secondary | ICD-10-CM | POA: Diagnosis not present

## 2019-05-27 DIAGNOSIS — M14671 Charcot's joint, right ankle and foot: Secondary | ICD-10-CM | POA: Diagnosis not present

## 2019-05-27 DIAGNOSIS — S90422D Blister (nonthermal), left great toe, subsequent encounter: Secondary | ICD-10-CM | POA: Diagnosis not present

## 2019-06-03 ENCOUNTER — Ambulatory Visit: Payer: BC Managed Care – PPO | Admitting: Podiatry

## 2019-06-08 ENCOUNTER — Other Ambulatory Visit: Payer: Self-pay | Admitting: Family Medicine

## 2019-06-10 ENCOUNTER — Ambulatory Visit: Payer: BC Managed Care – PPO | Admitting: Podiatry

## 2019-06-10 ENCOUNTER — Ambulatory Visit (INDEPENDENT_AMBULATORY_CARE_PROVIDER_SITE_OTHER): Payer: BC Managed Care – PPO

## 2019-06-10 ENCOUNTER — Other Ambulatory Visit: Payer: Self-pay

## 2019-06-10 ENCOUNTER — Telehealth: Payer: Self-pay | Admitting: *Deleted

## 2019-06-10 DIAGNOSIS — L02612 Cutaneous abscess of left foot: Secondary | ICD-10-CM | POA: Diagnosis not present

## 2019-06-10 DIAGNOSIS — L97524 Non-pressure chronic ulcer of other part of left foot with necrosis of bone: Secondary | ICD-10-CM

## 2019-06-10 DIAGNOSIS — L03032 Cellulitis of left toe: Secondary | ICD-10-CM | POA: Diagnosis not present

## 2019-06-10 DIAGNOSIS — E11621 Type 2 diabetes mellitus with foot ulcer: Secondary | ICD-10-CM | POA: Diagnosis not present

## 2019-06-10 DIAGNOSIS — E1142 Type 2 diabetes mellitus with diabetic polyneuropathy: Secondary | ICD-10-CM

## 2019-06-10 MED ORDER — CLINDAMYCIN HCL 150 MG PO CAPS
150.0000 mg | ORAL_CAPSULE | Freq: Three times a day (TID) | ORAL | 0 refills | Status: AC
Start: 2019-06-10 — End: 2019-06-17

## 2019-06-10 NOTE — Telephone Encounter (Signed)
I spoke with pt and he states there was an emergency during his visit so Dr. March Rummage may have forgotten to order. I asked pt if the antibiotic was a refill of the keflex. Pt states he doesn't know.

## 2019-06-10 NOTE — Telephone Encounter (Signed)
Pt states he saw Dr. March Rummage today and was to have an antibiotic sent to his pharmacy.

## 2019-06-10 NOTE — Patient Instructions (Signed)
Pre-Operative Instructions  Congratulations, you have decided to take an important step towards improving your quality of life.  You can be assured that the doctors and staff at [facility] will be with you every step of the way.  Here are some important things you should know:  1. Plan to be at the surgery center/hospital at least 1 (one) hour prior to your scheduled time, unless otherwise directed by the surgical center/hospital staff.  You must have a responsible adult accompany you, remain during the surgery and drive you home.  Make sure you have directions to the surgical center/hospital to ensure you arrive on time. 2. If you are having surgery at [facility] or [facility], you will need a copy of your medical history and physical form from your family physician within one month prior to the date of surgery. We will give you a form for your primary physician to complete.  3. We make every effort to accommodate the date you request for surgery.  However, there are times where surgery dates or times have to be moved.  We will contact you as soon as possible if a change in schedule is required.   4. No aspirin/ibuprofen for one week before surgery.  If you are on aspirin, any non-steroidal anti-inflammatory medications (Mobic, Aleve, Ibuprofen) should not be taken seven (7) days prior to your surgery.  You make take Tylenol for pain prior to surgery.  5. Medications - If you are taking daily heart and blood pressure medications, seizure, reflux, allergy, asthma, anxiety, pain or diabetes medications, make sure you notify the surgery center/hospital before the day of surgery so they can tell you which medications you should take or avoid the day of surgery. 6. No food or drink after midnight the night before surgery unless directed otherwise by surgical center/hospital staff. 7. No alcoholic beverages 24-hours prior to surgery.  No smoking 24-hours prior or 24-hours after  surgery. 8. Wear loose pants or shorts. They should be loose enough to fit over bandages, boots, and casts. 9. Don't wear slip-on shoes. Sneakers are preferred. 10. Bring your boot with you to the surgery center/hospital.  Also bring crutches or a walker if your physician has prescribed it for you.  If you do not have this equipment, it will be provided for you after surgery. 11. If you have not been contacted by the surgery center/hospital by the day before your surgery, call to confirm the date and time of your surgery. 12. Leave-time from work may vary depending on the type of surgery you have.  Appropriate arrangements should be made prior to surgery with your employer. 13. Prescriptions will be provided immediately following surgery by your doctor.  Fill these as soon as possible after surgery and take the medication as directed. Pain medications will not be refilled on weekends and must be approved by the doctor. 14. Remove nail polish on the operative foot and avoid getting pedicures prior to surgery. 15. Wash the night before surgery.  The night before surgery wash the foot and leg well with water and the antibacterial soap provided. Be sure to pay special attention to beneath the toenails and in between the toes.  Wash for at least three (3) minutes. Rinse thoroughly with water and dry well with a towel.  Perform this wash unless told not to do so by your physician.  Enclosed: 1 Ice pack (please put in freezer the night before surgery)   1 Hibiclens skin cleaner     Pre-op instructions  If you have any questions regarding the instructions, please do not hesitate to call our office.  [place]: [address], [place], [place] [address] -- [phone]  [place]: [address] [place], [place] [address] -- [phone]  [place]: [address], , [place] [address] -- [phone]   Website: [email or web address] 

## 2019-06-10 NOTE — Telephone Encounter (Signed)
Left message on pt's home phone that I would call his mobile to ask questions about the antibiotic.

## 2019-06-12 NOTE — H&P (View-Only) (Signed)
  Subjective:  Patient ID: [name], male    DOB: December 12, 1970,  MRN: [id number]  Chief Complaint  Patient presents with  . Wound Check    pt is here for a wound check of the left foot, pt states that the wound has been going on since yesterday, pt is also concerned about the swelling in his leg as well.   49 y.o. male presents for wound care. Hx confirmed with patient.  Objective:  Physical Exam: Wound Location: left 2nd toe Wound Measurement: 0.3x0.3 Wound Base: Mixed Granular/Fibrotic Peri-wound: Reddened Exudate: Moderate amount Purulent exudate intact blister noted, + induration, + fluctuance and + tenderness  No images are attached to the encounter.  Radiographs:  X-ray of the left foot: Edema of the second toe with soft tissue emphysema no definite osteolysis Assessment:   1. Cellulitis and abscess of toe of left foot   2. DM type 2 with diabetic peripheral neuropathy ([place])   3. Diabetic ulcer of toe of left foot associated with type 2 diabetes mellitus, with necrosis of bone ([place])    Plan:  Patient was evaluated and treated and all questions answered.  Ulcer left 2nd toe -XR reviewed -Incised and drained as below -Culture taken -Rx clindamycin TID. -Order MRI for possible osteomyelitis -Offload with CAM boot -Ultimately may need amputation or debridement.  Consent process started for possible procedure.  Plan for debridement versus amputation of the left second toe if improvement not noted or if MRI shows osteomyelitis.  Procedure: incision and drainage Anesthesia: Lidocaine 1% plain; 64mL Instrumentation: 11 blade Technique: Following local anesthesia and Betadine skin prep, the distal edge of the toe was incised with purulent drainage released.  This was collected for culture.  Maximal expression of the purulence was performed followed by dressing with Betadine 4 x 4 Kerlix Ace bandage Dressing: Dry, sterile, compression dressing. Disposition: Patient  tolerated procedure well. Patient to return in 1 week for follow-up.   No follow-ups on file.

## 2019-06-12 NOTE — Progress Notes (Signed)
  Subjective:  Patient ID: [name], male    DOB: 16-Jun-1970,  MRN: [id number]  Chief Complaint  Patient presents with  . Wound Check    pt is here for a wound check of the left foot, pt states that the wound has been going on since yesterday, pt is also concerned about the swelling in his leg as well.   49 y.o. male presents for wound care. Hx confirmed with patient.  Objective:  Physical Exam: Wound Location: left 2nd toe Wound Measurement: 0.3x0.3 Wound Base: Mixed Granular/Fibrotic Peri-wound: Reddened Exudate: Moderate amount Purulent exudate intact blister noted, + induration, + fluctuance and + tenderness  No images are attached to the encounter.  Radiographs:  X-ray of the left foot: Edema of the second toe with soft tissue emphysema no definite osteolysis Assessment:   1. Cellulitis and abscess of toe of left foot   2. DM type 2 with diabetic peripheral neuropathy ([place])   3. Diabetic ulcer of toe of left foot associated with type 2 diabetes mellitus, with necrosis of bone ([place])    Plan:  Patient was evaluated and treated and all questions answered.  Ulcer left 2nd toe -XR reviewed -Incised and drained as below -Culture taken -Rx clindamycin TID. -Order MRI for possible osteomyelitis -Offload with CAM boot -Ultimately may need amputation or debridement.  Consent process started for possible procedure.  Plan for debridement versus amputation of the left second toe if improvement not noted or if MRI shows osteomyelitis.  Procedure: incision and drainage Anesthesia: Lidocaine 1% plain; 75mL Instrumentation: 11 blade Technique: Following local anesthesia and Betadine skin prep, the distal edge of the toe was incised with purulent drainage released.  This was collected for culture.  Maximal expression of the purulence was performed followed by dressing with Betadine 4 x 4 Kerlix Ace bandage Dressing: Dry, sterile, compression dressing. Disposition: Patient  tolerated procedure well. Patient to return in 1 week for follow-up.   No follow-ups on file.

## 2019-06-13 ENCOUNTER — Telehealth: Payer: Self-pay

## 2019-06-13 LAB — WOUND CULTURE
MICRO NUMBER:: 10506209
SPECIMEN QUALITY:: ADEQUATE

## 2019-06-13 NOTE — Progress Notes (Addendum)
Phone [phone] In person visit   Subjective:   [name] is a 49 y.o. year old very pleasant male patient who presents for/with See problem oriented charting Chief Complaint  Patient presents with  . Medical Clearance   This visit occurred during the SARS-CoV-2 public health emergency.  Safety protocols were in place, including screening questions prior to the visit, additional usage of staff PPE, and extensive cleaning of exam room while observing appropriate contact time as indicated for disinfecting solutions.   Past Medical History-  Patient Active Problem List   Diagnosis Date Noted  . Morbid obesity ([place]) 03/08/2015    Priority: High  . Type II diabetes mellitus with neurological manifestations ([place]) 03/07/2015    Priority: High  . Diabetic retinopathy ([place]) 01/25/2018    Priority: Medium  . Chronic kidney disease (CKD), stage III (moderate) 11/17/2016    Priority: Medium  . Diabetic neuropathy ([place]) 11/17/2016    Priority: Medium  . Elevated LFTs     Priority: Medium  . Hyperlipidemia associated with type 2 diabetes mellitus ([place]) 03/08/2015    Priority: Medium  . Hypertension associated with diabetes ([place]) 03/08/2015    Priority: Medium  . Status post split thickness skin graft 12/08/2016    Priority: Low  . B12 deficiency 11/17/2016    Priority: Low  . Iron deficiency anemia 11/17/2016    Priority: Low  . 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 Atherosclerosis    Right lower extermity with  ulceration  . CKD (chronic kidney disease), stage III    nephrologist-  dr [name] ([facility] in [place])    . Diabetic foot ulcer with osteomyelitis ([place])    right lateral foot--- s/p  amputation 4th and 5th ray , ACELL , Wound VAC  .  Diabetic peripheral neuropathy ([place])   . Elevated LFTs   . Foot ulcer ([place])    left foot  . Hypertension   . Iron deficiency anemia   . Mixed hyperlipidemia   . Obesity (BMI 30-39.9)   . 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 Type 2 diabetes mellitus treated with insulin ([place])    last A1c 6.2 on 06/02/2017  . Vitamin B 12 deficiency     Family History  Problem Relation Age of Onset  . Stroke Father        early 72s. former smoker  . Diabetes Father   . Hypertension Father   . Sarcoidosis Mother   . Hypertension Sister   . Healthy Daughter   . Diabetes Paternal Grandmother   . Congenital heart disease Paternal Grandmother        died of complications    Social History   Social History Narrative   Married. 10 year old daughter 10/2016.       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       Hobbies: drawing, archery, tinkering    Medications-  reviewed and updated Current Outpatient Medications  Medication Sig Dispense Refill  . ascorbic acid (VITAMIN C) 500 MG tablet Take by mouth.    Marland Kitchen aspirin EC 81 MG tablet Take 81 mg by mouth daily.    Marland Kitchen atorvastatin (LIPITOR) 40 MG tablet TAKE 1 1/2 TABLETS BY MOUTH EVERY MORNING 135 tablet 0  . BD VEO INSULIN SYR U/F 1/2UNIT 31G X 15/64" 0.3 ML MISC USE TO INJECT INSULIN FOUR TIMES DAILY AS DIRECTED 300 each 2  . clindamycin (CLEOCIN) 150 MG capsule Take 1 capsule (150 mg total) by mouth 3 (three) times daily for 7 days. 21 capsule 0  . Cyanocobalamin (VITAMIN B-12) 5000 MCG SUBL Place 5,000 mcg under the tongue daily.    . fenofibrate (TRICOR) 48 MG tablet TAKE 1  TABLET(48 MG) BY MOUTH DAILY 90 tablet 0  . ferrous sulfate 325 (65 FE) MG tablet Take 1 tablet (325 mg total) by mouth 2 (two) times daily with a meal. 60 tablet 3  . glucose blood (ACCU-CHEK GUIDE) test strip Dx E11.49 - Use to check blood glucose three times daily or as directed. 300 each 1  . hydrochlorothiazide (MICROZIDE) 12.5 MG capsule TAKE 1 CAPSULE(12.5 MG) BY MOUTH DAILY IN THE MORNING 90 capsule 0  . insulin aspart (NOVOLOG) 100 UNIT/ML injection Inject 15-25 units nto the skin 2 (two) times daily (Patient taking differently: Inject 15-25 units nto the skin 2 (two) times daily currently 15 u bid) 50 mL 3  . Insulin Degludec (TRESIBA) 100 UNIT/ML SOLN Inject 64 Units into the skin at bedtime. 60 mL 3  . Insulin Syringe-Needle U-100 (BD INSULIN SYRINGE U/F) 31G X 5/16" 0.5 ML MISC Use to inject insulin 4 times daily 100 each 4  . Lancets MISC USED TO CHECK BLOOD GLUCOSE 3-4 TIMES DAILY 200 each 3  . Multiple Vitamin (MULTIVITAMIN WITH MINERALS) TABS tablet Take 1 tablet by mouth 2 (two) times daily.    . Omega-3 Fatty Acids (FISH OIL) 1360 MG CAPS Take 1,360 mg by mouth daily.     Marland Kitchen pyridoxine (B-6) 100 MG tablet Take 100 mg by mouth 2 (two) times daily.    . ramipril (ALTACE) 5 MG capsule TAKE 1 CAPSULE(5 MG) BY MOUTH EVERY MORNING 90 capsule 0  . sitaGLIPtin (JANUVIA) 50 MG tablet Take 1 tablet (50 mg total) by mouth every morning. 90 tablet 3   No current facility-administered medications for this visit.     Objective:  BP 122/78   Pulse 81   Temp 98.1 F (36.7 C) (Temporal)   Ht 6' (1.829 m)   Wt 297 lb (134.7 kg)   SpO2 95%   BMI 40.28 kg/m  Gen: NAD, resting comfortably CV: RRR no murmurs rubs or gallops Lungs: CTAB no crackles, wheeze, rhonchi Ext: no edema Skin: warm, dry Left foot in walking boot    Assessment and Plan  Surgical Clearance S: ulcer left 2nd toe 06/10/19 - podiatry drained. He has a planned debridement and possible amputation of distal portion.    Able to easily complete 4 METS of activity without chest pain or shortness of breath EKG reassuring last visit. Plan for next tuesday A/P: Patient is medically maximized for surgery as long as labs are stable-we are updating labs as below.  I do want to avoid low blood sugars and we are drastically cutting back his insulin-he can let me know if he has any further lows.  Around the time of surgery would be reasonable to reduce an  additional 10 units  # Diabetes S: Medication:NovoLog 15-20 units twice daily with Tresiba at night anywhere from 65 to 72 units each morning and Januvia 50 mg every morning at last visit-we reduced [place] to 63 units due to some lows. CBGs- still getting several lows in mornings- even as low as 44  Exercise and diet- down 3 pounds from last visit- may contribute to lows Lab Results  Component Value Date   HGBA1C 6.4 01/17/2019   HGBA1C 6.1 (A) 10/15/2018   HGBA1C 5.6 02/04/2018   A/P:diabetes appears overcontrolled- will update a1c. We are going to reduce his tresiba to 53 units. He is going to update me if he has any lows on this regimen- I really want him to call me if running under 70 or send mychart message (online portal)   #hypertension/CKD stage III S: medication: ramipril 5 mg daily and hydrochlorothiazide daily without issues. Home readings #s: Does not check  GFR last check was 57- knows to avoid nsaids BP Readings from Last 3 Encounters:  06/14/19 122/78  03/03/19 128/84  10/15/18 122/84  A/P: blood pressure stable. Hopefully ckd stable. Continue current meds.   #hyperlipidemia S: Medication:Atorvastatin 60 mg and fenofibrate.  Occasional myalgias with cramps in hands Lab Results  Component Value Date   CHOL 140 01/17/2019   HDL 40.90 10/15/2018   LDLCALC 81 01/17/2019   LDLDIRECT 61.0 02/02/2017   TRIG 112.0 10/15/2018   CHOLHDL 4 10/15/2018   A/P: Last LDL was slightly above goal of 70 or lessBut hesitant to increase dose with some  myalgias.  Plus have mild LFT elevations   #Elevated LFTs-mild elevations less than 2 times upper limit of normal.  Update labs today. Weight loss has been goal Lab Results  Component Value Date   ALT 57 (A) 01/17/2019   AST 43 (A) 01/17/2019   ALKPHOS 75 01/17/2019   BILITOT 0.8 10/15/2018    Recommended follow up: keep September visit Future Appointments  Date Time Provider [place]  06/18/2019 12:15 PM [facility] None  06/24/2019  4:15 PM [name], DPM [facility]  07/01/2019  2:15 PM March Rummage, Christian Mate, DPM [facility]  07/08/2019  2:00 PM [name], DPM [facility]  07/22/2019  2:45 PM March Rummage, Christian Mate, DPM [facility]  10/19/2019  9:20 AM Yong Channel, Brayton Mars, MD [facility]    Lab/Order associations:   ICD-10-CM   1. Type II diabetes mellitus with neurological manifestations (HCC)  E11.49 CBC with Differential/Platelet    Comprehensive metabolic panel    LDL cholesterol, direct    Hemoglobin A1c  2. Hypertension associated with diabetes ([place])  E11.59    I10   3. Hyperlipidemia associated with type 2 diabetes mellitus ([place])  E11.69    E78.5   4. Elevated LFTs  R79.89   5. Stage 3 chronic kidney disease, unspecified whether stage 3a or 3b CKD  N18.30    Return precautions advised.  [name], MD

## 2019-06-13 NOTE — Patient Instructions (Addendum)
Health Maintenance Due  Topic Date Due  . OPHTHALMOLOGY EXAM had 10/20 will send for notes  06/11/2019   diabetes appears overcontrolled- will update a1c. We are going to reduce his tresiba to 53 units. He is going to update me if he has any lows on this regimen- I really want him to call me if running under 70 or send mychart message (online portal)  Day before surgery cut an additional 10 units off at least.   Team please update covid 19 vaccine dates to  April 16 2019 for first vaccine  Please stop by lab before you go If you have mychart- we will send your results within 3 business days of [place] receiving them.  If you do not have mychart- we will call you about results within 5 business days of [place] receiving them.

## 2019-06-13 NOTE — Telephone Encounter (Signed)
DOS 06/21/2019  TOE AMPUTATION MPJ 2ND LT - 28820  BCBS EFFECTIVE DATE - 01/21/2019     In-Network   Max Per Benefit Period Year-to-Date Remaining     CoInsurance 20%      Deductible $1000.00 $1000.00     Out-Of-Pocket $5000.00 0000000  Copay Not Applicable Coinsurance    20%  per  Service Year  Authorization Required  No

## 2019-06-13 NOTE — Telephone Encounter (Signed)
Rx was sent Friday.

## 2019-06-14 ENCOUNTER — Encounter: Payer: Self-pay | Admitting: Family Medicine

## 2019-06-14 ENCOUNTER — Ambulatory Visit: Payer: BC Managed Care – PPO | Admitting: Family Medicine

## 2019-06-14 ENCOUNTER — Other Ambulatory Visit: Payer: Self-pay

## 2019-06-14 ENCOUNTER — Telehealth: Payer: Self-pay | Admitting: *Deleted

## 2019-06-14 ENCOUNTER — Other Ambulatory Visit: Payer: Self-pay | Admitting: Podiatry

## 2019-06-14 VITALS — BP 122/78 | HR 81 | Temp 98.1°F | Ht 72.0 in | Wt 297.0 lb

## 2019-06-14 DIAGNOSIS — R7989 Other specified abnormal findings of blood chemistry: Secondary | ICD-10-CM

## 2019-06-14 DIAGNOSIS — N183 Chronic kidney disease, stage 3 unspecified: Secondary | ICD-10-CM

## 2019-06-14 DIAGNOSIS — E785 Hyperlipidemia, unspecified: Secondary | ICD-10-CM

## 2019-06-14 DIAGNOSIS — E11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ical complication: Secondary | ICD-10-CM | POA: Diagnosis not present

## 2019-06-14 DIAGNOSIS — M868X7 Other osteomyelitis, ankle and foot: Secondary | ICD-10-CM

## 2019-06-14 DIAGNOSIS — L02612 Cutaneous abscess of left foot: Secondary | ICD-10-CM

## 2019-06-14 DIAGNOSIS — L03032 Cellulitis of left toe: Secondary | ICD-10-CM

## 2019-06-14 DIAGNOSIS — I152 Hypertension secondary to endocrine disorders: Secondary | ICD-10-CM

## 2019-06-14 DIAGNOSIS — E1169 Type 2 diabetes mellitus with other specified complication: Secondary | ICD-10-CM

## 2019-06-14 DIAGNOSIS — M79676 Pain in unspecified toe(s): Secondary | ICD-10-CM

## 2019-06-14 DIAGNOSIS — I10 Essential (primary) hypertension: Secondary | ICD-10-CM

## 2019-06-14 DIAGNOSIS — E1159 Type 2 diabetes mellitus with other circulatory complications: Secondary | ICD-10-CM

## 2019-06-14 LAB — CBC WITH DIFFERENTIAL/PLATELET
Basophils Absolute: 0 10*3/uL (ref 0.0–0.1)
Basophils Relative: 0.3 % (ref 0.0–3.0)
Eosinophils Absolute: 0.1 10*3/uL (ref 0.0–0.7)
Eosinophils Relative: 1.2 % (ref 0.0–5.0)
HCT: 35.9 % — ABNORMAL LOW (ref 39.0–52.0)
Hemoglobin: 11.7 g/dL — ABNORMAL LOW (ref 13.0–17.0)
Lymphocytes Relative: 14.9 % (ref 12.0–46.0)
Lymphs Abs: 1.8 10*3/uL (ref 0.7–4.0)
MCHC: 32.4 g/dL (ref 30.0–36.0)
MCV: 80.4 fl (ref 78.0–100.0)
Monocytes Absolute: 0.7 10*3/uL (ref 0.1–1.0)
Monocytes Relative: 5.6 % (ref 3.0–12.0)
Neutro Abs: 9.4 10*3/uL — ABNORMAL HIGH (ref 1.4–7.7)
Neutrophils Relative %: 78 % — ABNORMAL HIGH (ref 43.0–77.0)
Platelets: 390 10*3/uL (ref 150.0–400.0)
RBC: 4.47 Mil/uL (ref 4.22–5.81)
RDW: 13.8 % (ref 11.5–15.5)
WBC: 12 10*3/uL — ABNORMAL HIGH (ref 4.0–10.5)

## 2019-06-14 LAB — COMPREHENSIVE METABOLIC PANEL
ALT: 35 U/L (ref 0–53)
AST: 26 U/L (ref 0–37)
Albumin: 3.7 g/dL (ref 3.5–5.2)
Alkaline Phosphatase: 97 U/L (ref 39–117)
BUN: 27 mg/dL — ABNORMAL HIGH (ref 6–23)
CO2: 29 mEq/L (ref 19–32)
Calcium: 9.7 mg/dL (ref 8.4–10.5)
Chloride: 102 mEq/L (ref 96–112)
Creatinine, Ser: 1.74 mg/dL — ABNORMAL HIGH (ref 0.40–1.50)
GFR: 50.68 mL/min — ABNORMAL LOW (ref >60.00)
Glucose, Bld: 149 mg/dL — ABNORMAL HIGH (ref 70–99)
Potassium: 4.7 mEq/L (ref 3.5–5.1)
Sodium: 136 mEq/L (ref 135–145)
Total Bilirubin: 0.5 mg/dL (ref 0.2–1.2)
Total Protein: 7.7 g/dL (ref 6.0–8.3)

## 2019-06-14 LAB — LDL CHOLESTEROL, DIRECT: Direct LDL: 61 mg/dL

## 2019-06-14 LAB — HEMOGLOBIN A1C: Hgb A1c MFr Bld: 5.5 % (ref 4.6–6.5)

## 2019-06-14 NOTE — Telephone Encounter (Signed)
Orders given to [name], [place] for pre-cert and faxed to [facility].

## 2019-06-14 NOTE — Telephone Encounter (Signed)
-----   Message from [name], DPM sent at 06/12/2019  9:19 PM EDT ----- Can we order MRI left foot to r/o osteomyelitis

## 2019-06-16 ENCOUNTER — Encounter (HOSPITAL_BASED_OUTPATIENT_CLINIC_OR_DEPARTMENT_OTHER): Payer: Self-pay | Admitting: Podiatry

## 2019-06-17 ENCOUNTER — Encounter (HOSPITAL_BASED_OUTPATIENT_CLINIC_OR_DEPARTMENT_OTHER): Payer: Self-pay | Admitting: Podiatry

## 2019-06-17 ENCOUNTER — Other Ambulatory Visit: Payer: Self-pay

## 2019-06-17 NOTE — Progress Notes (Addendum)
ADDENDUM:  Chart reviewed by anesthesia, Konrad Felix PA, ok to proceed meets guidelines.   Spoke w/ via phone for pre-op interview--- PT Lab needs dos----  no            Lab results------ pt had CBCdiff, CMP, A1c done 06-14-2019 results in epic/ chart;  Current ekg in epic/ chart COVID test ------ 06-18-2019 @ 1215 Arrive at ------- 0645 NPO after ------ [place] Medications to take morning of surgery ----- Lipitor , Tricor w/ sips of water Diabetic medication ----- do not take [place] or do novolog insulin morning of surgery.  Do half dose of Tresiba night before surgery (26.5 units) Patient Special Instructions ----- n/a Pre-Op special Istructions ----- pt's pcp H&P dated 06-14-2019 with chart Patient verbalized understanding of instructions that were given at this phone interview. Patient denies shortness of breath, chest pain, fever, cough a this phone interview.   Anesthesia Review: hx HTN, DM2 w/ insulin, CKD3, PAD.  Chart to be reviewed by Konrad Felix PA  PCP: Dr [name] (lov 06-14-2019 epic) Cardiologist : no Nephrologist:  Dr Audie Clear Cassell Clement 06-24-2016 care everywhere) Chest x-ray : 07-28-2016 epic EKG : 03-03-2019 epic Echo : no Stress test:  Pt stated never had one Cardiac Cath :  no Sleep Study/ CPAP :  NO Fasting Blood Sugar :  Average 71    / Checks Blood Sugar -- times a day:  Daily in AM Blood Thinner/ Instructions /Last Dose: NO ASA / Instructions/ Last Dose :  ASA 81mg /  Pt stated not given any instructions about whether to stop or not from Dr March Rummage  Advised pt to call Dr March Rummage office and his PCP office for instructions, pt verbalized understanding

## 2019-06-18 ENCOUNTER — Other Ambulatory Visit (HOSPITAL_COMMUNITY)
Admission: RE | Admit: 2019-06-18 | Discharge: 2019-06-18 | Disposition: A | Discharge status: Home / Self Care | Payer: BC Managed Care – PPO | Source: Ambulatory Visit | Attending: Podiatry | Admitting: Podiatry

## 2019-06-18 DIAGNOSIS — Z01812 Encounter for preprocedural laboratory examination: Secondary | ICD-10-CM | POA: Insufficient documentation

## 2019-06-18 DIAGNOSIS — Z20822 Contact with and (suspected) exposure to covid-19: Secondary | ICD-10-CM | POA: Insufficient documentation

## 2019-06-18 LAB — SARS CORONAVIRUS 2 (TAT 6-24 HRS): SARS Coronavirus 2: NEGATIVE

## 2019-06-21 ENCOUNTER — Ambulatory Visit (HOSPITAL_COMMUNITY): Payer: BC Managed Care – PPO

## 2019-06-21 ENCOUNTER — Ambulatory Visit (HOSPITAL_BASED_OUTPATIENT_CLINIC_OR_DEPARTMENT_OTHER)
Admission: RE | Admit: 2019-06-21 | Discharge: 2019-06-21 | Disposition: A | Discharge status: Home / Self Care | Payer: BC Managed Care – PPO | Attending: Podiatry | Admitting: Podiatry

## 2019-06-21 ENCOUNTER — Other Ambulatory Visit: Payer: Self-pay

## 2019-06-21 ENCOUNTER — Ambulatory Visit (HOSPITAL_BASED_OUTPATIENT_CLINIC_OR_DEPARTMENT_OTHER): Payer: BC Managed Care – PPO | Admitting: Physician Assistant

## 2019-06-21 ENCOUNTER — Encounter (HOSPITAL_BASED_OUTPATIENT_CLINIC_OR_DEPARTMENT_OTHER)
Admission: RE | Disposition: A | Discharge status: Home / Self Care | Payer: Self-pay | Source: Home / Self Care | Attending: Podiatry

## 2019-06-21 ENCOUNTER — Encounter (HOSPITAL_BASED_OUTPATIENT_CLINIC_OR_DEPARTMENT_OTHER): Payer: Self-pay | Admitting: Podiatry

## 2019-06-21 DIAGNOSIS — E1142 Type 2 diabetes mellitus with diabetic polyneuropathy: Secondary | ICD-10-CM | POA: Insufficient documentation

## 2019-06-21 DIAGNOSIS — M869 Osteomyelitis, unspecified: Secondary | ICD-10-CM | POA: Diagnosis not present

## 2019-06-21 DIAGNOSIS — E1169 Type 2 diabetes mellitus with other specified complication: Secondary | ICD-10-CM | POA: Diagnosis not present

## 2019-06-21 DIAGNOSIS — E11621 Type 2 diabetes mellitus with foot ulcer: Secondary | ICD-10-CM | POA: Diagnosis not present

## 2019-06-21 DIAGNOSIS — I129 Hypertensive chronic kidney disease with stage 1 through stage 4 chronic kidney disease, or unspecified chronic kidney disease: Secondary | ICD-10-CM | POA: Insufficient documentation

## 2019-06-21 DIAGNOSIS — Z6841 Body Mass Index (BMI) 40.0 and over, adult: Secondary | ICD-10-CM | POA: Insufficient documentation

## 2019-06-21 DIAGNOSIS — E1122 Type 2 diabetes mellitus with diabetic chronic kidney disease: Secondary | ICD-10-CM | POA: Insufficient documentation

## 2019-06-21 DIAGNOSIS — N183 Chronic kidney disease, stage 3 unspecified: Secondary | ICD-10-CM | POA: Insufficient documentation

## 2019-06-21 DIAGNOSIS — E1151 Type 2 diabetes mellitus with diabetic peripheral angiopathy without gangrene: Secondary | ICD-10-CM | POA: Insufficient documentation

## 2019-06-21 DIAGNOSIS — L02612 Cutaneous abscess of left foot: Secondary | ICD-10-CM | POA: Diagnosis not present

## 2019-06-21 DIAGNOSIS — Z794 Long term (current) use of insulin: Secondary | ICD-10-CM | POA: Diagnosis not present

## 2019-06-21 DIAGNOSIS — L03032 Cellulitis of left toe: Secondary | ICD-10-CM | POA: Diagnosis not present

## 2019-06-21 DIAGNOSIS — Z9889 Other specified postprocedural states: Secondary | ICD-10-CM

## 2019-06-21 HISTORY — DX: Cutaneous abscess of unspecified foot: L03.119

## 2019-06-21 HISTORY — DX: Cutaneous abscess of unspecified foot: L02.619

## 2019-06-21 HISTORY — PX: AMPUTATION TOE: SHX6595

## 2019-06-21 LAB — GLUCOSE, CAPILLARY
Glucose-Capillary: 80 mg/dL (ref 70–99)
Glucose-Capillary: 93 mg/dL (ref 70–99)

## 2019-06-21 SURGERY — AMPUTATION, TOE
Anesthesia: Monitor Anesthesia Care | Site: Toe | Laterality: Left

## 2019-06-21 MED ORDER — CIPROFLOXACIN HCL 500 MG PO TABS
500.0000 mg | ORAL_TABLET | Freq: Two times a day (BID) | ORAL | 0 refills | Status: DC
Start: 2019-06-21 — End: 2019-06-24

## 2019-06-21 MED ORDER — CEFAZOLIN SODIUM-DEXTROSE 1-4 GM/50ML-% IV SOLN
INTRAVENOUS | Status: AC
  Filled 2019-06-21: qty 50

## 2019-06-21 MED ORDER — LIDOCAINE 2% (20 MG/ML) 5 ML SYRINGE
INTRAMUSCULAR | Status: DC | PRN
  Administered 2019-06-21: 50 mg via INTRAVENOUS

## 2019-06-21 MED ORDER — OXYCODONE-ACETAMINOPHEN 5-325 MG PO TABS: 1.0000 | ORAL_TABLET | ORAL | 0 refills | Status: DC | PRN

## 2019-06-21 MED ORDER — DEXTROSE 5 % IV SOLN
3.0000 g | INTRAVENOUS | Status: AC
  Administered 2019-06-21: 3 g via INTRAVENOUS
  Filled 2019-06-21: qty 3

## 2019-06-21 MED ORDER — LIDOCAINE 2% (20 MG/ML) 5 ML SYRINGE
INTRAMUSCULAR | Status: AC
  Filled 2019-06-21: qty 5

## 2019-06-21 MED ORDER — BUPIVACAINE HCL (PF) 0.5 % IJ SOLN
INTRAMUSCULAR | Status: DC | PRN
  Administered 2019-06-21: 10 mL

## 2019-06-21 MED ORDER — PROPOFOL 10 MG/ML IV BOLUS
INTRAVENOUS | Status: DC | PRN
  Administered 2019-06-21: 50 mg via INTRAVENOUS

## 2019-06-21 MED ORDER — PROPOFOL 500 MG/50ML IV EMUL
INTRAVENOUS | Status: AC
  Filled 2019-06-21: qty 50

## 2019-06-21 MED ORDER — SODIUM CHLORIDE 0.9 % IV SOLN: INTRAVENOUS | Status: DC

## 2019-06-21 MED ORDER — PROPOFOL 500 MG/50ML IV EMUL
INTRAVENOUS | Status: DC | PRN
  Administered 2019-06-21: 100 ug/kg/min via INTRAVENOUS

## 2019-06-21 MED ORDER — CEFAZOLIN SODIUM-DEXTROSE 2-4 GM/100ML-% IV SOLN
INTRAVENOUS | Status: AC
  Filled 2019-06-21: qty 100

## 2019-06-21 MED ORDER — OXYCODONE HCL 5 MG/5ML PO SOLN: 5.0000 mg | Freq: Once | ORAL | Status: DC | PRN

## 2019-06-21 MED ORDER — ONDANSETRON HCL 4 MG/2ML IJ SOLN: 4.0000 mg | Freq: Once | INTRAMUSCULAR | Status: DC | PRN

## 2019-06-21 MED ORDER — VANCOMYCIN HCL 1 G IV SOLR
INTRAVENOUS | Status: DC | PRN
  Administered 2019-06-21: 1000 mg via TOPICAL

## 2019-06-21 MED ORDER — OXYCODONE HCL 5 MG PO TABS: 5.0000 mg | ORAL_TABLET | Freq: Once | ORAL | Status: DC | PRN

## 2019-06-21 MED ORDER — FENTANYL CITRATE (PF) 100 MCG/2ML IJ SOLN: 25.0000 ug | INTRAMUSCULAR | Status: DC | PRN

## 2019-06-21 SURGICAL SUPPLY — 46 items
APL PRP STRL LF DISP 70% ISPRP (MISCELLANEOUS)
BLADE SURG 10 STRL SS (BLADE) IMPLANT
BLADE SURG 15 STRL LF DISP TIS (BLADE) ×1 IMPLANT
BLADE SURG 15 STRL SS (BLADE) ×3
BNDG ELASTIC 4X5.8 VLCR STR LF (GAUZE/BANDAGES/DRESSINGS) ×3 IMPLANT
BNDG GAUZE ELAST 4 BULKY (GAUZE/BANDAGES/DRESSINGS) ×3 IMPLANT
CHLORAPREP W/TINT 26 (MISCELLANEOUS) IMPLANT
CNTNR URN SCR LID CUP LEK RST (MISCELLANEOUS) ×1 IMPLANT
CONT SPEC 4OZ STRL OR WHT (MISCELLANEOUS) ×3
COVER BACK TABLE 60X90IN (DRAPES) ×3 IMPLANT
COVER WAND RF STERILE (DRAPES) ×3 IMPLANT
CUFF TOURN SGL QUICK 18X4 (TOURNIQUET CUFF) ×3 IMPLANT
CUFF TOURN SGL QUICK 24 (TOURNIQUET CUFF)
CUFF TRNQT CYL 24X4X16.5-23 (TOURNIQUET CUFF) IMPLANT
DECANTER SPIKE VIAL GLASS SM (MISCELLANEOUS) ×3 IMPLANT
DRAPE 3/4 80X56 (DRAPES) ×3 IMPLANT
DRAPE EXTREMITY T 121X128X90 (DISPOSABLE) ×3 IMPLANT
GAUZE 4X4 16PLY RFD (DISPOSABLE) IMPLANT
GAUZE SPONGE 4X4 12PLY STRL (GAUZE/BANDAGES/DRESSINGS) ×3 IMPLANT
GAUZE XEROFORM 1X8 LF (GAUZE/BANDAGES/DRESSINGS) ×3 IMPLANT
GLOVE BIO SURGEON STRL SZ7.5 (GLOVE) ×3 IMPLANT
GLOVE BIOGEL PI IND STRL 8 (GLOVE) ×1 IMPLANT
GLOVE BIOGEL PI INDICATOR 8 (GLOVE) ×2
GOWN STRL REUS W/TWL XL LVL3 (GOWN DISPOSABLE) ×3 IMPLANT
NDL HYPO 25X1 1.5 SAFETY (NEEDLE) ×1 IMPLANT
NEEDLE HYPO 25X1 1.5 SAFETY (NEEDLE) ×3 IMPLANT
PENCIL BUTTON HOLSTER BLD 10FT (ELECTRODE) ×3 IMPLANT
SET BASIN DAY SURGERY F.S. (CUSTOM PROCEDURE TRAY) ×3 IMPLANT
STAPLER VISISTAT 35W (STAPLE) IMPLANT
STOCKINETTE 6  STRL (DRAPES) ×3
STOCKINETTE 6 STRL (DRAPES) ×1 IMPLANT
SUCTION FRAZIER HANDLE 10FR (MISCELLANEOUS) ×3
SUCTION TUBE FRAZIER 10FR DISP (MISCELLANEOUS) ×1 IMPLANT
SUT ETHILON 4 0 PS 2 18 (SUTURE) ×3 IMPLANT
SUT MNCRL AB 3-0 PS2 18 (SUTURE) ×3 IMPLANT
SUT MNCRL AB 3-0 PS2 27 (SUTURE) ×3 IMPLANT
SUT MNCRL AB 4-0 PS2 18 (SUTURE) ×3 IMPLANT
SUT VIC AB 2-0 SH 27 (SUTURE) ×3
SUT VIC AB 2-0 SH 27XBRD (SUTURE) ×1 IMPLANT
SYR BULB EAR ULCER 3OZ GRN STR (SYRINGE) ×3 IMPLANT
TOWEL OR 17X26 10 PK STRL BLUE (TOWEL DISPOSABLE) ×3 IMPLANT
TRAY DSU PREP LF (CUSTOM PROCEDURE TRAY) ×3 IMPLANT
TUBE CONNECTING 12'X1/4 (SUCTIONS) ×1
TUBE CONNECTING 12X1/4 (SUCTIONS) ×2 IMPLANT
UNDERPAD 30X30 (UNDERPADS AND DIAPERS) ×12 IMPLANT
YANKAUER SUCT BULB TIP NO VENT (SUCTIONS) ×3 IMPLANT

## 2019-06-21 NOTE — Op Note (Signed)
Patient Name: [name] DOB: [birth year]  MRN: [id number]   Date of Service: 06/21/2019  Surgeon: Dr. Hardie Pulley, DPM Assistants: None Pre-operative Diagnosis:  Osteomyelitis, abscess Post-operative Diagnosis:  same Procedures:  1) Amputation left 2nd toe - IPJ Pathology/Specimens: ID Type Source Tests Collected by Time Destination  1 : LEFT SECOND TOE Tissue PATH Soft tissue resection SURGICAL PATHOLOGY [name], DPM 06/21/2019 0851   A : LEFT SECOND TOE Tissue PATH Soft tissue resection AEROBIC/ANAEROBIC CULTURE (SURGICAL/DEEP WOUND) [name], DPM 06/21/2019 0623    Anesthesia: MAC/local Hemostasis: anatomic Estimated Blood Loss: 5 mL Materials: * No implants in log * Medications: 1g Vancomycin Complications: none  Indications for Procedure:  This is a 49 y.o. male with an infection to the left 2nd toe. Given the severity of the infection amputation was indicated. All risks, benefits, and alternatives were discussed. No guarantees were gien   Procedure in Detail: Patient was identified in pre-operative holding area. Formal consent was signed and the left lower extremity was marked. Patient was brought back to the operating room. Anesthesia was induced. The extremity was prepped and draped in the usual sterile fashion. Timeout was taken to confirm patient name, laterality, and procedure prior to incision.   Attention was then directed to the left 2nd toe. Purulence at the distal aspect of the digit was collected with swab culture. An incision was made distal to the PIPJ. Dissection was carried down to level of bone.  Dissection was continued to the proximal interphalangeal joint and all collateral ligaments were freed at the joint.  The bone soft tissue attachments of the midlle phalanx were removed and passed for pathology.  The remaining proximal phalangeal head appeared healthy and viable.  The area was copiously irrigated. Vancomycin powder was applied  topically. The skin was reapproximated with nylon and skin staples.   The foot was then dressed with betadine, xeroform, 4x4, kerlix, and ACE bandage. Patient tolerated the procedure well.   Disposition: Following a period of post-operative monitoring, patient will be transferred home.

## 2019-06-21 NOTE — Interval H&P Note (Signed)
History and Physical Interval Note:  06/21/2019 7:15 AM  [name] [name]  has presented today for surgery, with the diagnosis of CELLULITIS AND ABCESS OF LEFT FOOT.  The various methods of treatment have been discussed with the patient and family. After consideration of risks, benefits and other options for treatment, the patient has consented to  Procedure(s): AMPUTATION TOE SECOND LEFT (Left) as a surgical intervention. He still has purulence of the digit with edema.   The patient's history has been reviewed, patient examined, no change in status, stable for surgery.  I have reviewed the patient's chart and labs.  Questions were answered to the patient's satisfaction.     [name]

## 2019-06-21 NOTE — Anesthesia Postprocedure Evaluation (Signed)
Anesthesia Post Note  Patient: [name]  Procedure(s) Performed: AMPUTATION TOE SECOND LEFT, DEBRIDEMENT OF LEFT GREAT TOE (Left Toe)     Patient location during evaluation: PACU Anesthesia Type: MAC Level of consciousness: awake and alert Pain management: pain level controlled Vital Signs Assessment: post-procedure vital signs reviewed and stable Respiratory status: spontaneous breathing, nonlabored ventilation and respiratory function stable Cardiovascular status: blood pressure returned to baseline and stable Postop Assessment: no apparent nausea or vomiting Anesthetic complications: no    Last Vitals:  Vitals:   06/21/19 0900 06/21/19 0915  BP: 108/61 112/72  Pulse: 72 71  Resp: 16 13  Temp:    SpO2: 100% 100%    Last Pain:  Vitals:   06/21/19 0930  TempSrc:   PainSc: 0-No pain                 Lidia Collum

## 2019-06-21 NOTE — Transfer of Care (Signed)
Immediate Anesthesia Transfer of Care Note  Patient: [name]  Procedure(s) Performed: Procedure(s) (LRB): AMPUTATION TOE SECOND LEFT, DEBRIDEMENT OF LEFT GREAT TOE (Left)  Patient Location: PACU  Anesthesia Type: MAC  Level of Consciousness: awake, alert , oriented and patient cooperative  Airway & Oxygen Therapy: Patient Spontanous Breathing and Patient connected to face mask oxygen  Post-op Assessment: Report given to PACU RN and Post -op Vital signs reviewed and stable  Post vital signs: Reviewed and stable  Complications: No apparent anesthesia complications Last Vitals:  Vitals Value Taken Time  BP 114/70 06/21/19 0858  Temp    Pulse 72 06/21/19 0859  Resp 16 06/21/19 0859  SpO2 100 % 06/21/19 0859  Vitals shown include unvalidated device data.  Last Pain:  Vitals:   06/21/19 0642  TempSrc: Oral  PainSc: 0-No pain

## 2019-06-21 NOTE — Anesthesia Preprocedure Evaluation (Signed)
Anesthesia Evaluation  Patient identified by MRN, date of birth, ID band Patient awake    Reviewed: Allergy & Precautions, NPO status , Patient's Chart, lab work & pertinent test results  History of Anesthesia Complications Negative for: history of anesthetic complications  Airway Mallampati: II  TM Distance: >3 FB Neck ROM: Full    Dental  (+) Teeth Intact   Pulmonary neg pulmonary ROS,    Pulmonary exam normal        Cardiovascular hypertension, + Peripheral Vascular Disease  Normal cardiovascular exam     Neuro/Psych Pituitary microadenoma, managed expectantly negative neurological ROS  negative psych ROS   GI/Hepatic negative GI ROS, Neg liver ROS,   Endo/Other  diabetes, Type 2, Insulin DependentMorbid obesity  Renal/GU Renal disease (CKD III)  negative genitourinary   Musculoskeletal negative musculoskeletal ROS (+)   Abdominal   Peds  Hematology  (+) Blood dyscrasia, anemia ,   Anesthesia Other Findings   Reproductive/Obstetrics                            Anesthesia Physical Anesthesia Plan  ASA: III  Anesthesia Plan: MAC   Post-op Pain Management:    Induction: Intravenous  PONV Risk Score and Plan: 1 and Propofol infusion, TIVA and Treatment may vary due to age or medical condition  Airway Management Planned: Natural Airway, Nasal Cannula and Simple Face Mask  Additional Equipment: None  Intra-op Plan:   Post-operative Plan:   Informed Consent: I have reviewed the patients History and Physical, chart, labs and discussed the procedure including the risks, benefits and alternatives for the proposed anesthesia with the patient or authorized representative who has indicated his/her understanding and acceptance.       Plan Discussed with:   Anesthesia Plan Comments:         Anesthesia Quick Evaluation

## 2019-06-21 NOTE — Discharge Instructions (Signed)

## 2019-06-22 LAB — SURGICAL PATHOLOGY

## 2019-06-24 ENCOUNTER — Ambulatory Visit (INDEPENDENT_AMBULATORY_CARE_PROVIDER_SITE_OTHER): Payer: BC Managed Care – PPO | Admitting: Podiatry

## 2019-06-24 ENCOUNTER — Other Ambulatory Visit: Payer: Self-pay

## 2019-06-24 DIAGNOSIS — E11621 Type 2 diabetes mellitus with foot ulcer: Secondary | ICD-10-CM

## 2019-06-24 DIAGNOSIS — M868X7 Other osteomyelitis, ankle and foot: Secondary | ICD-10-CM

## 2019-06-24 DIAGNOSIS — L97524 Non-pressure chronic ulcer of other part of left foot with necrosis of bone: Secondary | ICD-10-CM

## 2019-06-24 MED ORDER — CIPROFLOXACIN HCL 500 MG PO TABS: 500.0000 mg | ORAL_TABLET | Freq: Two times a day (BID) | ORAL | 0 refills | Status: AC

## 2019-06-24 NOTE — Progress Notes (Signed)
Subjective:  Patient ID: [name], male    DOB: 1970/09/30,  MRN: [id number]  Chief Complaint  Patient presents with  . Routine Post Op    POV#1 DOS 6.1.2021 TOE AMPUTATION MPJ 2ND LT. Pt states healing well without any concerns. Denies fever/chills/nausea/vomiting.    DOS: 06/21/19 Procedure: Partial amputation left 2nd toe  49 y.o. male presents with the above complaint. History confirmed with patient.   Objective:  Physical Exam: no tenderness at the surgical site, local edema noted and calf supple, nontender. Incision: healing well, no significant drainage, no dehiscence, no significant erythema  Results for orders placed or performed during the hospital encounter of 06/21/19  Aerobic/Anaerobic Culture (surgical/deep wound)     Status: None (Preliminary result)   Collection Time: 06/21/19  8:39 AM   Specimen: PATH Soft tissue resection  Result Value Ref Range Status   Specimen Description   Final    WOUND LEFT SECOND TOE Performed at [facility] [address], [place], [place] [address]    Special Requests   Final    NONE Performed at [facility], [place] [address], [place], [place] [address]    Gram Stain   Final    NO WBC SEEN FEW GRAM NEGATIVE RODS FEW GRAM POSITIVE RODS FEW GRAM POSITIVE COCCI    Culture   Final    ABUNDANT PROTEUS MIRABILIS ABUNDANT GROUP B STREP(S.AGALACTIAE)ISOLATED TESTING AGAINST S. AGALACTIAE NOT ROUTINELY PERFORMED DUE TO PREDICTABILITY OF AMP/PEN/VAN SUSCEPTIBILITY. FEW KLEBSIELLA OXYTOCA HOLDING FOR POSSIBLE ANAEROBE Performed at [facility], [place] [address] [place], [place] [address]    Report Status PENDING  Incomplete   Organism ID, Bacteria PROTEUS MIRABILIS  Final   Organism ID, Bacteria KLEBSIELLA OXYTOCA  Final      Susceptibility   Klebsiella oxytoca - MIC*    AMPICILLIN >=32 RESISTANT Resistant     CEFAZOLIN <=4 SENSITIVE Sensitive     CEFEPIME <=1 SENSITIVE  Sensitive     CEFTAZIDIME <=1 SENSITIVE Sensitive     CEFTRIAXONE <=1 SENSITIVE Sensitive     CIPROFLOXACIN <=0.25 SENSITIVE Sensitive     GENTAMICIN <=1 SENSITIVE Sensitive     IMIPENEM <=0.25 SENSITIVE Sensitive     TRIMETH/SULFA <=20 SENSITIVE Sensitive     AMPICILLIN/SULBACTAM 8 SENSITIVE Sensitive     PIP/TAZO <=4 SENSITIVE Sensitive     * FEW KLEBSIELLA OXYTOCA   Proteus mirabilis - MIC*    AMPICILLIN <=2 SENSITIVE Sensitive     CEFAZOLIN <=4 SENSITIVE Sensitive     CEFEPIME <=1 SENSITIVE Sensitive     CEFTAZIDIME <=1 SENSITIVE Sensitive     CEFTRIAXONE <=1 SENSITIVE Sensitive     CIPROFLOXACIN <=0.25 SENSITIVE Sensitive     GENTAMICIN <=1 SENSITIVE Sensitive     IMIPENEM 1 SENSITIVE Sensitive     TRIMETH/SULFA <=20 SENSITIVE Sensitive     AMPICILLIN/SULBACTAM <=2 SENSITIVE Sensitive     PIP/TAZO <=4 SENSITIVE Sensitive     * ABUNDANT PROTEUS MIRABILIS    Assessment:   1. Other osteomyelitis of left foot ([place])   2. Diabetic ulcer of toe of left foot associated with type 2 diabetes mellitus, with necrosis of bone ([place])     Plan:  Patient was evaluated and treated and all questions answered.  Post-operative State -Dressing applied consisting of betadine, sterile gauze, kerlix and ACE bandage -WBAT in CAM boot  -Culture reviewed. Refilled for 1 additional week -Path reviewed. Confirms OM. -Plan for possible suture removal next week.  Return  in about 1 week (around 07/01/2019).

## 2019-06-25 LAB — AEROBIC/ANAEROBIC CULTURE W GRAM STAIN (SURGICAL/DEEP WOUND): Gram Stain: NONE SEEN

## 2019-06-27 ENCOUNTER — Telehealth: Payer: Self-pay | Admitting: Podiatry

## 2019-06-27 NOTE — Telephone Encounter (Signed)
Pt wife called and states that the antitbiotic that Dr. March Rummage called in  Friday 06/24/19 it was called as a new medciation but pt only needed for refills due to insurance pt already has the medication from first given will need to just fix the medication order it is only refills and not a new script. Please advise

## 2019-06-27 NOTE — Telephone Encounter (Signed)
Left message informing pt's wife, Lelan Pons, the medication ordered 06/24/2019 [facility] did not have refills, so needed to be ordered as new.

## 2019-07-01 ENCOUNTER — Ambulatory Visit (INDEPENDENT_AMBULATORY_CARE_PROVIDER_SITE_OTHER): Payer: BC Managed Care – PPO | Admitting: Podiatry

## 2019-07-01 ENCOUNTER — Other Ambulatory Visit: Payer: Self-pay

## 2019-07-01 DIAGNOSIS — E11621 Type 2 diabetes mellitus with foot ulcer: Secondary | ICD-10-CM

## 2019-07-01 DIAGNOSIS — L97524 Non-pressure chronic ulcer of other part of left foot with necrosis of bone: Secondary | ICD-10-CM

## 2019-07-01 DIAGNOSIS — M868X7 Other osteomyelitis, ankle and foot: Secondary | ICD-10-CM

## 2019-07-04 NOTE — Progress Notes (Signed)
  Subjective:  Patient ID: [name], male    DOB: 29-Nov-1970,  MRN: [id number]  Chief Complaint  Patient presents with  . Wound Check    Pt states left foot is improving and he has no concerns.  . Foot Pain    Pt states pain in right foot lateral aspect 2 days duration, no known injuries.   49 y.o. male presents for wound care. Hx confirmed with patient.  Objective:  Physical Exam: Blisters left hallux and 2nd toe without warmth, no erythema, no purulence, no signs of acute infection.  Right foot with local warmth erythema lateral foot Assessment:   1. Cellulitis of foot, right   2. Blister of left great toe, subsequent encounter   3. Blister of second toe of left foot, subsequent encounter   4. Charcot's joint of foot, right    Plan:  Patient was evaluated and treated and all questions answered.  Blisters hallux, 2nd toe -Dressed with silvadene ointment and band-aid -Dress daily with abx ointment and band-aid.  Charcot Process right -XR taken and reviewed concern for charcot process. -Offload with CAM boot  Return in about 1 week (around 05/20/2019) for Wound Care, Left Charcot Right - repeat XR right.

## 2019-07-07 NOTE — Progress Notes (Signed)
  Subjective:  Patient ID: Jamael Aminah Zabawa [name], male    DOB: 01/30/1970,  MRN: [id number]  Chief Complaint  Patient presents with  . Routine Post Op     POV #2 DOS 06/21/19 TOE AMPUTAION MPJ 2ND LT - looks very good - hopes to get sutures/staples out today    DOS: 06/21/19 Procedure: Partial amputation left 2nd toe  49 y.o. male presents with the above complaint. History confirmed with patient.   Objective:  Physical Exam: no tenderness at the surgical site, local edema noted and calf supple, nontender. Incision: healing well, no significant drainage, no dehiscence, no significant erythema  Assessment:   1. Other osteomyelitis of left foot ([place])   2. Diabetic ulcer of toe of left foot associated with type 2 diabetes mellitus, with necrosis of bone ([place])     Plan:  Patient was evaluated and treated and all questions answered.  Post-operative State -Dressing applied consisting of betadine, sterile gauze, kerlix and ACE bandage -WBAT in Surgical shoe  -Sutures and staples left intact -Remove sutures next week.  No follow-ups on file.

## 2019-07-08 ENCOUNTER — Ambulatory Visit: Payer: BC Managed Care – PPO | Admitting: Podiatry

## 2019-07-15 ENCOUNTER — Telehealth: Payer: Self-pay | Admitting: Podiatry

## 2019-07-15 NOTE — Telephone Encounter (Signed)
I informed pt's wife, Lelan Pons states the foot worsened so had surgery without having the MRI, but they keep getting calls and were told our office would have to cancel the MRI.

## 2019-07-15 NOTE — Telephone Encounter (Signed)
Pt wife called stated that they keep receiving calls from [facility] but pt wife stated he already had surgry  06/22/19 would like clarification do they still need to schedule the MRI

## 2019-07-18 NOTE — Progress Notes (Signed)
  Subjective:  Patient ID: [name], male    DOB: [birth year],  MRN: [id number]  No chief complaint on file.  49 y.o. male presents with the above complaint. History confirmed with patient.   Objective:  Physical Exam: warm, good capillary refill, nail exam onychomycosis of the toenails, no trophic changes or ulcerative lesions, normal DP and PT pulses, and absent protective sensation Left Foot: normal exam, no swelling, tenderness, instability; ligaments intact, full range of motion of all ankle/foot joints. Left hallux dried blister. Right Foot: partial lateral ray amputation noted, multiple hyperkeratoses along lateral foot.  No images are attached to the encounter.  Assessment:   1. Onychomycosis of multiple toenails with type 2 diabetes mellitus and peripheral neuropathy ([place])      Plan:  Patient was evaluated and treated and all questions answered.  Onychomycosis, Diabetes and DPN -Patient is diabetic with a qualifying condition for at risk foot care.   Procedure: Nail Debridement Rationale: Patient meets criteria for routine foot care due to DPN Type of Debridement: manual, sharp debridement. Instrumentation: Nail nipper, rotary burr. Number of Nails: 6     No follow-ups on file.

## 2019-07-22 ENCOUNTER — Other Ambulatory Visit: Payer: Self-pay

## 2019-07-22 ENCOUNTER — Other Ambulatory Visit: Payer: Self-pay | Admitting: Podiatry

## 2019-07-22 ENCOUNTER — Ambulatory Visit (INDEPENDENT_AMBULATORY_CARE_PROVIDER_SITE_OTHER): Payer: BC Managed Care – PPO | Admitting: Podiatry

## 2019-07-22 ENCOUNTER — Ambulatory Visit (INDEPENDENT_AMBULATORY_CARE_PROVIDER_SITE_OTHER): Payer: BC Managed Care – PPO

## 2019-07-22 DIAGNOSIS — M868X7 Other osteomyelitis, ankle and foot: Secondary | ICD-10-CM

## 2019-07-22 DIAGNOSIS — E11621 Type 2 diabetes mellitus with foot ulcer: Secondary | ICD-10-CM

## 2019-07-22 DIAGNOSIS — L97524 Non-pressure chronic ulcer of other part of left foot with necrosis of bone: Secondary | ICD-10-CM

## 2019-07-22 DIAGNOSIS — M79672 Pain in left foot: Secondary | ICD-10-CM

## 2019-07-22 DIAGNOSIS — L02612 Cutaneous abscess of left foot: Secondary | ICD-10-CM

## 2019-07-22 DIAGNOSIS — L03032 Cellulitis of left toe: Secondary | ICD-10-CM

## 2019-07-22 DIAGNOSIS — E1142 Type 2 diabetes mellitus with diabetic polyneuropathy: Secondary | ICD-10-CM

## 2019-07-22 NOTE — Progress Notes (Signed)
  Subjective:  Patient ID: [name], male    DOB: 04-01-1970,  MRN: [id number]  Chief Complaint  Patient presents with  . Routine Post Op    POV # 4 DOS 06/21/19. No pain, no concerns. Callous on the plantar left foot     DOS: 06/21/19 Procedure: Partial amputation left 2nd toe  49 y.o. male presents with the above complaint. History confirmed with patient.   Objective:  Physical Exam: no tenderness at the surgical site, local edema noted and calf supple, nontender. Incision: Amputation site well-healed.  Previously preulcerative lesions at the first metatarsal and cuboid areas left, small draining lesion right cuboid area without warmth erythema signs of infection.  Assessment:   1. Other osteomyelitis of left foot ([place])   2. Diabetic ulcer of toe of left foot associated with type 2 diabetes mellitus, with necrosis of bone ([place])   3. DM type 2 with diabetic peripheral neuropathy (HCC)   4. Cellulitis and abscess of toe of left foot     Plan:  Patient was evaluated and treated and all questions answered.  Post-operative State -Post-op wound healed. -Would benefit from diabetic inserts with offload the left first metatarsal and cuboid, right first metatarsal, lateral foot with added partial foot filler, also offload cuboid area. -Wound right dressed with povidone ointment and Band-Aid.  Patient to dress daily with antibiotic ointment and Band-Aid -We will keep patient out of work until we fabricate his diabetic inserts  No follow-ups on file.

## 2019-08-03 NOTE — Progress Notes (Signed)
  Subjective:  Patient ID: [name], male    DOB: 21-Jul-1970,  MRN: [id number]  Chief Complaint  Patient presents with  . Wound Check    Healing well without any concerns. Denies fever/chills/nausea/vomiting.   49 y.o. male presents for wound care. Hx confirmed with patient.  Objective:  Physical Exam: Healing wound left 2nd toe. Left hallux wound healed. Both without warmth, no erythema, no purulence, no signs of acute infection.  Right foot without edema, local warmth lateral foot. Assessment:   1. Ulcer of toe, left, limited to breakdown of skin ([place])   2. Blister of left great toe, subsequent encounter   3. Charcot's joint of foot, right    Plan:  Patient was evaluated and treated and all questions answered.  Ulcers hallux, 2nd toe -Hallux appears healed. Left 2nd toe still with open ulcer but improving. -Dressed with silvadene ointment and band-aid -Dress daily with abx ointment and band-aid.  Charcot Process right -Repeat XR without worsening changes.  No follow-ups on file.

## 2019-08-03 NOTE — Progress Notes (Signed)
  Subjective:  Patient ID: [name] Persons [name], male    DOB: [birth year],  MRN: [id number]  Chief Complaint  Patient presents with  . Follow-up    R charcot foot and L foot (blisters) - "No pain/drainage/bleeding/fever/chills/N&V/foul odors/significantly abnormal glucose".   49 y.o. male presents for wound care. Hx confirmed with patient.  Objective:  Physical Exam: Healing wounds left hallux and 2nd toe without warmth, no erythema, no purulence, no signs of acute infection.  Right foot without edema, local warmth lateral foot. Assessment:   1. Cellulitis of foot, right   2. Ulcer of toe, left, limited to breakdown of skin [facility])    Plan:  Patient was evaluated and treated and all questions answered.  Blisters hallux, 2nd toe -Dressed with silvadene ointment and band-aid -Dress daily with abx ointment and band-aid.  Charcot Process right -Repeat XR without worsening changes.  No follow-ups on file.

## 2019-08-08 ENCOUNTER — Other Ambulatory Visit: Payer: Self-pay

## 2019-08-08 ENCOUNTER — Ambulatory Visit (INDEPENDENT_AMBULATORY_CARE_PROVIDER_SITE_OTHER): Payer: BC Managed Care – PPO | Admitting: Orthotics

## 2019-08-08 DIAGNOSIS — L97524 Non-pressure chronic ulcer of other part of left foot with necrosis of bone: Secondary | ICD-10-CM | POA: Diagnosis not present

## 2019-08-08 DIAGNOSIS — E1142 Type 2 diabetes mellitus with diabetic polyneuropathy: Secondary | ICD-10-CM

## 2019-08-08 DIAGNOSIS — E11621 Type 2 diabetes mellitus with foot ulcer: Secondary | ICD-10-CM

## 2019-08-08 DIAGNOSIS — S90422D Blister (nonthermal), left great toe, subsequent encounter: Secondary | ICD-10-CM

## 2019-08-08 DIAGNOSIS — S90425D Blister (nonthermal), left lesser toe(s), subsequent encounter: Secondary | ICD-10-CM

## 2019-08-08 NOTE — Progress Notes (Signed)
L5000 Right foot due to 4th/56h amputation Accommodative on left.

## 2019-08-22 DIAGNOSIS — M79676 Pain in unspecified toe(s): Secondary | ICD-10-CM

## 2019-08-30 ENCOUNTER — Other Ambulatory Visit: Payer: BC Managed Care – PPO | Admitting: Orthotics

## 2019-08-30 ENCOUNTER — Ambulatory Visit (INDEPENDENT_AMBULATORY_CARE_PROVIDER_SITE_OTHER): Payer: BC Managed Care – PPO | Admitting: Podiatry

## 2019-08-30 ENCOUNTER — Other Ambulatory Visit: Payer: Self-pay

## 2019-08-30 DIAGNOSIS — L97524 Non-pressure chronic ulcer of other part of left foot with necrosis of bone: Secondary | ICD-10-CM | POA: Diagnosis not present

## 2019-08-30 DIAGNOSIS — E11621 Type 2 diabetes mellitus with foot ulcer: Secondary | ICD-10-CM | POA: Diagnosis not present

## 2019-08-30 DIAGNOSIS — M14671 Charcot's joint, right ankle and foot: Secondary | ICD-10-CM

## 2019-08-30 DIAGNOSIS — E08621 Diabetes mellitus due to underlying condition with foot ulcer: Secondary | ICD-10-CM | POA: Diagnosis not present

## 2019-08-30 DIAGNOSIS — L97511 Non-pressure chronic ulcer of other part of right foot limited to breakdown of skin: Secondary | ICD-10-CM

## 2019-08-30 NOTE — Progress Notes (Signed)
  Subjective:  Patient ID: [name], male    DOB: 1970/10/24,  MRN: [id number]  Chief Complaint  Patient presents with  . Foot Problem    Left 1st toe bruising/abrasion pt states unknown duration and no known injuries.  . Routine Post Op    POV#5 DOS 6.1.2021 TOE AMPUTATION MPJ 2ND LT. Pt denies any concerns and denies fever/chills/nausea/vomiting.    DOS: 06/21/19 Procedure: Partial amputation left 2nd toe  49 y.o. male presents with the above complaint. History confirmed with patient.   Objective:  Physical Exam: no tenderness at the surgical site, local edema noted and calf supple, nontender. Incision: Amputation site well-healed.  Left hallux hardened abrasion without open ulceration.  Right plantar midfoot at about the subcuboid area with small open ulceration with serosanguineous drainage no warmth no erythema no signs of acute infection Assessment:   1. Diabetic ulcer of toe of left foot associated with type 2 diabetes mellitus, with necrosis of bone (HCC)   2. Charcot's joint of foot, right   3. Diabetic ulcer of other part of right foot associated with diabetes mellitus due to underlying condition, limited to breakdown of skin [facility])     Plan:  Patient was evaluated and treated and all questions answered.  Abrasion left great toe -Awaiting DM inserts -Toe Dispensed left -Wound minimally debrided right.  The offloading inserts should offload this wound sufficiently -Patient to apply Iodosorb to the wound right foot daily.  Dressed with Band-Aid.  This was performed today as well  Return in about 3 weeks (around 09/20/2019) for Wound Care, Bilateral.

## 2019-09-06 ENCOUNTER — Other Ambulatory Visit: Payer: Self-pay | Admitting: Family Medicine

## 2019-09-12 DIAGNOSIS — M79676 Pain in unspecified toe(s): Secondary | ICD-10-CM

## 2019-09-20 ENCOUNTER — Ambulatory Visit (INDEPENDENT_AMBULATORY_CARE_PROVIDER_SITE_OTHER): Payer: BC Managed Care – PPO

## 2019-09-20 ENCOUNTER — Ambulatory Visit (INDEPENDENT_AMBULATORY_CARE_PROVIDER_SITE_OTHER): Payer: BC Managed Care – PPO | Admitting: Podiatry

## 2019-09-20 ENCOUNTER — Other Ambulatory Visit: Payer: Self-pay | Admitting: Podiatry

## 2019-09-20 ENCOUNTER — Other Ambulatory Visit: Payer: Self-pay

## 2019-09-20 DIAGNOSIS — L97511 Non-pressure chronic ulcer of other part of right foot limited to breakdown of skin: Secondary | ICD-10-CM | POA: Diagnosis not present

## 2019-09-20 DIAGNOSIS — M14671 Charcot's joint, right ankle and foot: Secondary | ICD-10-CM

## 2019-09-20 DIAGNOSIS — M79671 Pain in right foot: Secondary | ICD-10-CM

## 2019-09-20 DIAGNOSIS — E08621 Diabetes mellitus due to underlying condition with foot ulcer: Secondary | ICD-10-CM | POA: Diagnosis not present

## 2019-09-20 DIAGNOSIS — E11621 Type 2 diabetes mellitus with foot ulcer: Secondary | ICD-10-CM

## 2019-09-22 ENCOUNTER — Encounter: Payer: Self-pay | Admitting: Podiatry

## 2019-09-22 NOTE — Progress Notes (Signed)
  Subjective:  Patient ID: Melody Tyriana Helmkamp [name], male    DOB: 1970/04/14,  MRN: [id number]  Chief Complaint  Patient presents with  . Routine Post Op    POV#5 Denies fever/nausea/vomiting/chills. No new concerns.    DOS: 06/21/19 Procedure: Partial amputation left 2nd toe  49 y.o. male presents with the above complaint. History confirmed with patient.   Objective:  Physical Exam: no tenderness at the surgical site, local edema noted and calf supple, nontender. Incision: Amputation site well-healed.  Right plantar midfoot at about the subcuboid area with continued small open ulceration with serosanguineous drainage no warmth no erythema no signs of acute infection  Assessment:   1. Charcot's joint of foot, right   2. Diabetic ulcer of other part of right foot associated with diabetes mellitus due to underlying condition, limited to breakdown of skin [facility])     Plan:  Patient was evaluated and treated and all questions answered.  Ulcer Right foot -Non-custom DM inserts given to patient as courtesy until his inserts come in. -XR taken no evidence of further lytic changes -Will need offloading of the cuboid later to prevent recurrent ulceration  Arch pain bilat -Should resolve upon receiving inserts.  No follow-ups on file.

## 2019-09-23 ENCOUNTER — Encounter: Payer: BC Managed Care – PPO | Admitting: Podiatry

## 2019-09-27 ENCOUNTER — Other Ambulatory Visit: Payer: Self-pay

## 2019-09-27 ENCOUNTER — Telehealth: Payer: Self-pay | Admitting: Family Medicine

## 2019-09-27 MED ORDER — TRESIBA 100 UNIT/ML ~~LOC~~ SOLN: 53.0000 [IU] | Freq: Every day | SUBCUTANEOUS | 1 refills | Status: DC

## 2019-09-27 NOTE — Telephone Encounter (Signed)
  LAST APPOINTMENT DATE:06/14/19  NEXT APPOINTMENT DATE:@1 /11/2020  MEDICATION: Syringes --Insulin Degludec (TRESIBA) 100 UNIT/ML SOLN  PHARMACY:  [facility] #[id number] [place], [place] - [address] AT [facility] OF [address]

## 2019-09-27 NOTE — Telephone Encounter (Signed)
Refill send in

## 2019-10-07 ENCOUNTER — Ambulatory Visit: Payer: BC Managed Care – PPO | Admitting: Orthotics

## 2019-10-07 ENCOUNTER — Other Ambulatory Visit: Payer: Self-pay

## 2019-10-07 DIAGNOSIS — E08621 Diabetes mellitus due to underlying condition with foot ulcer: Secondary | ICD-10-CM

## 2019-10-07 DIAGNOSIS — M14671 Charcot's joint, right ankle and foot: Secondary | ICD-10-CM

## 2019-10-07 DIAGNOSIS — L97511 Non-pressure chronic ulcer of other part of right foot limited to breakdown of skin: Secondary | ICD-10-CM

## 2019-10-07 DIAGNOSIS — L97524 Non-pressure chronic ulcer of other part of left foot with necrosis of bone: Secondary | ICD-10-CM

## 2019-10-07 DIAGNOSIS — E11621 Type 2 diabetes mellitus with foot ulcer: Secondary | ICD-10-CM

## 2019-10-07 DIAGNOSIS — Z89421 Acquired absence of other right toe(s): Secondary | ICD-10-CM

## 2019-10-07 DIAGNOSIS — E1142 Type 2 diabetes mellitus with diabetic polyneuropathy: Secondary | ICD-10-CM

## 2019-10-07 NOTE — Progress Notes (Signed)
Patient came in today to pick up custom made foot orthotics.  The goals were accomplished and the patient reported no dissatisfaction with said orthotics.  Patient was advised of breakin period and how to report any issues. 

## 2019-10-14 ENCOUNTER — Telehealth: Payer: Self-pay

## 2019-10-14 MED ORDER — "BD VEO INSULIN SYR U/F 1/2UNIT 31G X 15/64"" 0.3 ML MISC": 2 refills | Status: DC

## 2019-10-14 NOTE — Telephone Encounter (Signed)
Medication has been sent to the pharmacy. 

## 2019-10-14 NOTE — Telephone Encounter (Signed)
Pt is out of needles. Wife is asking if this can't be filled if they can pick some up from here today.     LAST APPOINTMENT DATE: 09/27/2019   NEXT APPOINTMENT DATE:@10 /28/2021  MEDICATION: BD VEO INSULIN SYR U/F 1/2UNIT 31G X 15/64" 0.3 ML MISC    rx [id number]  PHARMACY:[facility] #[id number] [place], [place] - [address] AT [place] [address]      [phone] - Wife

## 2019-10-19 ENCOUNTER — Encounter: Payer: Self-pay | Admitting: Family Medicine

## 2019-10-20 ENCOUNTER — Telehealth: Payer: Self-pay

## 2019-10-20 MED ORDER — "INSULIN SYRINGE-NEEDLE U-100 31G X 5/16"" 0.5 ML MISC": 4 refills | Status: DC

## 2019-10-20 NOTE — Telephone Encounter (Signed)
.   LAST APPOINTMENT DATE: 10/14/2019   NEXT APPOINTMENT DATE:@11 /04/2019  MEDICATION:Insulin Syringe-Needle U-100 (BD INSULIN SYRINGE U/F) 31G X 5/16" 0.5 ML MISC    PATIENT IS REQUESTING 1ML instead of 0.5 ML   PHARMACY:[id number] [place], [place] - [address] AT [facility] [address]  **Let patient know to contact pharmacy at the end of the day to make sure medication is ready. **  ** Please notify patient to allow 48-72 hours to process**  **Encourage patient to contact the pharmacy for refills or they can request refills through [facility]**  CLINICAL FILLS OUT ALL BELOW:   LAST REFILL:  QTY:  REFILL DATE:    OTHER COMMENTS:    Okay for refill?  Please advise

## 2019-10-20 NOTE — Telephone Encounter (Signed)
Medication has been sent to the patient's pharmacy.  

## 2019-10-25 ENCOUNTER — Telehealth: Payer: Self-pay | Admitting: Podiatry

## 2019-10-25 ENCOUNTER — Ambulatory Visit (INDEPENDENT_AMBULATORY_CARE_PROVIDER_SITE_OTHER): Payer: Self-pay

## 2019-10-25 ENCOUNTER — Ambulatory Visit (INDEPENDENT_AMBULATORY_CARE_PROVIDER_SITE_OTHER): Payer: BC Managed Care – PPO | Admitting: Podiatry

## 2019-10-25 ENCOUNTER — Other Ambulatory Visit: Payer: Self-pay

## 2019-10-25 ENCOUNTER — Telehealth: Payer: Self-pay

## 2019-10-25 VITALS — Temp 99.4°F

## 2019-10-25 DIAGNOSIS — L03115 Cellulitis of right lower limb: Secondary | ICD-10-CM

## 2019-10-25 DIAGNOSIS — M14671 Charcot's joint, right ankle and foot: Secondary | ICD-10-CM

## 2019-10-25 DIAGNOSIS — M86371 Chronic multifocal osteomyelitis, right ankle and foot: Secondary | ICD-10-CM

## 2019-10-25 DIAGNOSIS — E08621 Diabetes mellitus due to underlying condition with foot ulcer: Secondary | ICD-10-CM

## 2019-10-25 DIAGNOSIS — M868X7 Other osteomyelitis, ankle and foot: Secondary | ICD-10-CM

## 2019-10-25 DIAGNOSIS — L97513 Non-pressure chronic ulcer of other part of right foot with necrosis of muscle: Secondary | ICD-10-CM

## 2019-10-25 MED ORDER — DOXYCYCLINE HYCLATE 100 MG PO TABS: 100.0000 mg | ORAL_TABLET | Freq: Two times a day (BID) | ORAL | 0 refills | Status: DC

## 2019-10-25 NOTE — Telephone Encounter (Signed)
Pt was called at both home and mobile numbers, left message on mobile number: Pt was informed there is an issue with his insurance and that he needs to call the office as soon as possible to provide proper insurance information.

## 2019-10-25 NOTE — Telephone Encounter (Signed)
Pt wife called and wanted [name] MRI referral sent to the hospital instead of [facility]. [name] needs to know what is going on with his foot by Friday since that have a trip on Monday that they don't want to reschedule. She is aware they he is self pay.

## 2019-10-25 NOTE — Progress Notes (Signed)
  Subjective:  Patient ID: [name], male    DOB: 1970-09-02,  MRN: [id number]  Chief Complaint  Patient presents with  . Wound Check    Pt states wound draining 4-5 days, malodor, ankle swelling, soreness and swelling of plantar foot. Pt denies fever/nausea/vomiting/chills.    49 y.o. male presents for wound care. Hx confirmed with patient. Has been in his work boots recently which may have put more pressure on the area. Received DM inserts and think that they help. Objective:  Physical Exam: Wound Location: Right foot Wound Measurement: 2x15x1.5 Wound Base: Fibrotic slough Peri-wound: Macerated Exudate: Moderate amount Serosanguinous exudate + induration, + tenderness, erythema noted along wound margins extending 2 cm and undermining noted  No images are attached to the encounter.  Radiographs:  X-ray of the right foot: possible erosions noted vs charcot changes. No SQ emphysema Assessment:   1. Charcot's joint of foot, right   2. Diabetic ulcer of other part of right foot associated with diabetes mellitus due to underlying condition, with necrosis of muscle ([place])   3. Other osteomyelitis of left foot ([place])   4. Cellulitis of foot, right    Plan:  Patient was evaluated and treated and all questions answered.  Ulcer Right foot -Order MRI - eval OM vs charcot -Wound culture taken -Order wound supplies -Packed with Iodoform and DSD. Patient to remove tomorrow and replace with collagen dressing -Offload with boot, sx shoe dispensed today as his shoe did not fit. Necessary for safe offloading. -Wound cleansed and debrided  Procedure: Excisional Debridement of Wound Indication: Removal of non-viable soft tissue from the wound to promote healing.  Anesthesia: none Pre-Debridement Wound Measurements: 1.5 cm x 0.5 cm x 1.5 cm  Post-Debridement Wound Measurements: 2 cm x 1 cm x 1.5 cm  Type of Debridement: Sharp Excisional Tissue Removed: Non-viable soft  tissue Instrumentation: 15 blade and tissue nipper Depth of Debridement: subcutaneous tissue. Technique: Sharp excisional debridement to bleeding, viable wound base.  Dressing: Dry, sterile, compression dressing. Disposition: Patient tolerated procedure well.  F/u this Friday

## 2019-10-25 NOTE — Addendum Note (Signed)
Addended by: Hardie Pulley on: 10/25/2019 05:13 PM   Modules accepted: Orders

## 2019-10-26 ENCOUNTER — Telehealth: Payer: Self-pay | Admitting: Podiatry

## 2019-10-26 ENCOUNTER — Other Ambulatory Visit: Payer: Self-pay | Admitting: Podiatry

## 2019-10-26 DIAGNOSIS — M8600 Acute hematogenous osteomyelitis, unspecified site: Secondary | ICD-10-CM

## 2019-10-26 NOTE — Telephone Encounter (Signed)
Received call and that was told patient needed to get the MRI done today and the order needed to be changed to stat. Order placed.

## 2019-10-26 NOTE — Telephone Encounter (Signed)
Informed his wife

## 2019-10-26 NOTE — Progress Notes (Signed)
Received call and that was told patient needed to get the MRI done today and the order needed to be changed to stat. Order placed.

## 2019-10-26 NOTE — Telephone Encounter (Signed)
Hey Dr. March Rummage can you send this MRI over as STAT so they can get him in today. If not they will not get him scheduled until 2-3 weeks.

## 2019-10-26 NOTE — Telephone Encounter (Signed)
Thank you for placing the order

## 2019-10-27 ENCOUNTER — Ambulatory Visit
Admission: RE | Admit: 2019-10-27 | Discharge: 2019-10-27 | Disposition: A | Discharge status: Home / Self Care | Payer: Self-pay | Source: Ambulatory Visit | Attending: Podiatry | Admitting: Podiatry

## 2019-10-27 ENCOUNTER — Other Ambulatory Visit: Payer: Self-pay

## 2019-10-27 DIAGNOSIS — M8600 Acute hematogenous osteomyelitis, unspecified site: Secondary | ICD-10-CM | POA: Insufficient documentation

## 2019-10-28 ENCOUNTER — Telehealth: Payer: Self-pay | Admitting: Podiatry

## 2019-10-28 ENCOUNTER — Ambulatory Visit (INDEPENDENT_AMBULATORY_CARE_PROVIDER_SITE_OTHER): Payer: Self-pay | Admitting: Podiatry

## 2019-10-28 VITALS — Temp 98.2°F

## 2019-10-28 DIAGNOSIS — M14671 Charcot's joint, right ankle and foot: Secondary | ICD-10-CM

## 2019-10-28 DIAGNOSIS — E08621 Diabetes mellitus due to underlying condition with foot ulcer: Secondary | ICD-10-CM

## 2019-10-28 DIAGNOSIS — L97513 Non-pressure chronic ulcer of other part of right foot with necrosis of muscle: Secondary | ICD-10-CM

## 2019-10-28 DIAGNOSIS — M86171 Other acute osteomyelitis, right ankle and foot: Secondary | ICD-10-CM

## 2019-10-28 LAB — WOUND CULTURE
MICRO NUMBER:: 11033333
SPECIMEN QUALITY:: ADEQUATE

## 2019-10-28 MED ORDER — LEVOFLOXACIN 500 MG PO TABS: 500.0000 mg | ORAL_TABLET | Freq: Every day | ORAL | 0 refills | Status: DC

## 2019-10-28 MED ORDER — SILVER SULFADIAZINE 1 % EX CREA: TOPICAL_CREAM | CUTANEOUS | 0 refills | Status: DC

## 2019-10-28 NOTE — Progress Notes (Signed)
  Subjective:  Patient ID: [name], male    DOB: 23-Oct-1970,  MRN: [id number]  No chief complaint on file.  49 y.o. male presents for wound care. Hx confirmed with patient. States the wound is looking and feeling much better. Objective:  Physical Exam: Wound Location: Right foot Wound Measurement: 2x1.5x0.5 Wound Base: Fibrotic slough Peri-wound: Macerated Exudate: Moderate amount Serosanguinous exudate No further warmth and erythema, still POP  MRI 10/7 1. Acute osteomyelitis of the third metatarsal base extending to the level of the mid diaphysis. 2. Ulceration along the plantar surface of the lateral midfoot with ill-defined fluid collection underlying the lateral aspect of the third TMT joint measuring up to 3.0 cm suspicious for phlegmon/developing abscess. 3. Charcot arthropathy throughout the midfoot with bony destruction and erosions, most pronounced at the medial and intermediate cuneiforms as well as at the second metatarsal base. There is significant overlap of imaging findings for Charcot and septic arthritis and additional sites of osteomyelitis within the midfoot would be difficult to exclude by imaging alone. Assessment:   1. Acute osteomyelitis of right ankle or foot (HCC)   2. Charcot's joint of foot, right   3. Diabetic ulcer of other part of right foot associated with diabetes mellitus due to underlying condition, with necrosis of muscle ([place])    Plan:  Patient was evaluated and treated and all questions answered.  Ulcer Right foot -MRI results reviewed with patient. -Wound much improved, no need for debridement today. -Culture not finalized - GS with Many Gram negative bacilli Many Gram positive cocci in clusters -Continue current abx, will adjust adjust based on culture. -Given dressing supplies as he has not gotten in contact with [facility] -Refer to ID for abx recommendations, will likely need long term abx -Will benefit from surgical  intervention, plan for debridement and bone biopsy in the coming weeks.  F/u in 1 week  Post-visit addendum Culture results returned. Start Levaquin

## 2019-10-29 ENCOUNTER — Inpatient Hospital Stay: Admission: RE | Admit: 2019-10-29 | Payer: BC Managed Care – PPO | Source: Ambulatory Visit

## 2019-11-04 ENCOUNTER — Ambulatory Visit (INDEPENDENT_AMBULATORY_CARE_PROVIDER_SITE_OTHER): Payer: BLUE CROSS/BLUE SHIELD | Admitting: Podiatry

## 2019-11-04 ENCOUNTER — Other Ambulatory Visit: Payer: Self-pay

## 2019-11-04 DIAGNOSIS — M14671 Charcot's joint, right ankle and foot: Secondary | ICD-10-CM | POA: Diagnosis not present

## 2019-11-04 DIAGNOSIS — L97513 Non-pressure chronic ulcer of other part of right foot with necrosis of muscle: Secondary | ICD-10-CM | POA: Diagnosis not present

## 2019-11-04 DIAGNOSIS — M86171 Other acute osteomyelitis, right ankle and foot: Secondary | ICD-10-CM

## 2019-11-04 DIAGNOSIS — E08621 Diabetes mellitus due to underlying condition with foot ulcer: Secondary | ICD-10-CM

## 2019-11-04 MED ORDER — LEVOFLOXACIN 500 MG PO TABS
500.0000 mg | ORAL_TABLET | Freq: Every day | ORAL | 0 refills | Status: DC
Start: 2019-11-04 — End: 2019-11-23

## 2019-11-07 ENCOUNTER — Telehealth: Payer: Self-pay | Admitting: Podiatry

## 2019-11-07 NOTE — Telephone Encounter (Signed)
Pt wife called and stated that Tylik had an appointment schedule with infectious disease and she wanted to know if she needed that appointment earlier or 11/30/2019 is ok since its improving.

## 2019-11-07 NOTE — Telephone Encounter (Signed)
Called patient, lvm

## 2019-11-07 NOTE — Telephone Encounter (Signed)
If they are able to get the appointment earlier and that is always encouraged.  However 1110/21 is fine as well if they are not able to get an earlier appointment.  Thank you

## 2019-11-08 ENCOUNTER — Other Ambulatory Visit: Payer: Self-pay | Admitting: Family Medicine

## 2019-11-09 ENCOUNTER — Other Ambulatory Visit: Payer: Self-pay

## 2019-11-09 ENCOUNTER — Encounter: Payer: Self-pay | Admitting: Internal Medicine

## 2019-11-09 ENCOUNTER — Ambulatory Visit (INDEPENDENT_AMBULATORY_CARE_PROVIDER_SITE_OTHER): Payer: BLUE CROSS/BLUE SHIELD | Admitting: Internal Medicine

## 2019-11-09 VITALS — BP 141/93 | HR 95 | Temp 98.5°F | Ht 72.0 in | Wt 313.0 lb

## 2019-11-09 DIAGNOSIS — E1142 Type 2 diabetes mellitus with diabetic polyneuropathy: Secondary | ICD-10-CM | POA: Diagnosis not present

## 2019-11-09 DIAGNOSIS — M869 Osteomyelitis, unspecified: Secondary | ICD-10-CM | POA: Insufficient documentation

## 2019-11-09 DIAGNOSIS — N183 Chronic kidney disease, stage 3 unspecified: Secondary | ICD-10-CM

## 2019-11-09 DIAGNOSIS — E1159 Type 2 diabetes mellitus with other circulatory complications: Secondary | ICD-10-CM | POA: Diagnosis not present

## 2019-11-09 DIAGNOSIS — I152 Hypertension secondary to endocrine disorders: Secondary | ICD-10-CM

## 2019-11-09 DIAGNOSIS — M86171 Other acute osteomyelitis, right ankle and foot: Secondary | ICD-10-CM | POA: Insufficient documentation

## 2019-11-09 DIAGNOSIS — E1122 Type 2 diabetes mellitus with diabetic chronic kidney disease: Secondary | ICD-10-CM

## 2019-11-09 DIAGNOSIS — I129 Hypertensive chronic kidney disease with stage 1 through stage 4 chronic kidney disease, or unspecified chronic kidney disease: Secondary | ICD-10-CM

## 2019-11-09 DIAGNOSIS — E11628 Type 2 diabetes mellitus with other skin complications: Secondary | ICD-10-CM | POA: Diagnosis not present

## 2019-11-09 DIAGNOSIS — E11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ical complication: Secondary | ICD-10-CM

## 2019-11-09 NOTE — Progress Notes (Signed)
[facility]  Reason for Consult: diabetic foot infection Referring Provider: Dr March Rummage  HPI:  [name] is a 49 y.o. male who presents to clinic for further evaluation of a diabetic foot infection.     [name] has had issues with his right foot for some time now.  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  This was complicated by poor wound healing requiring further irrigation and debridements.  He underwent lower extremity angiography in October 2018 with Dr. [name].  At that time he did have some mild tibial disease with occlusion of the distal peroneal artery and dorsalis pedis artery but he had widely patent posterior tibial and anterior tibial arteries.  It was felt that he should have adequate circulation to heal his wound and there were no options to significantly improve his circulation at that time.  He began seeing Dr. March Rummage with the podiatry service in December 2019.  This was for consultation of a foot ulcer on the left fifth metatarsal and ultimately underwent metatarsectomy of the left fifth metatarsal on January 04, 2018.  In April 2021 he was evaluated by Dr. March Rummage and reported foot pain on the right lateral aspect of his foot for 2 days at that time.  An x-ray was taken and there was concern for Charcot process.  He was offloaded with a CAM boot.  During this time he was also dealing with an ulcer of his left second toe which ultimately was amputated on June 21, 2019.  Later that summer in August 2021 he was seen in clinic at which point he was found to have a right plantar midfoot ulcer with serosanguineous drainage.  No warmth or erythema.  No signs of acute infection.  Wound was minimally debrided on August 10 in the office.  Seen for follow-up on August 31 and he was given diabetic foot inserts.  X-ray did not show any evidence of lytic changes.  Plan was for offloading.  October 5 patient was seen again  in the clinic and reported wound was draining for several days, malodorous along with ankle swelling and soreness.  MRI was ordered.  Wound culture was taken and he was started on Doxycycline.  Cultures from this wound grew heavy growth of Proteus mirabilis and abx were switched to Levaquin.  Patient seen again October 8 after he had MRI October 7 which showed acute osteomyelitis of the third metatarsal base extending to the level of the mid diaphysis there was also ulceration on the plantar surface of the lateral midfoot with ill-defined fluid collection underlying the lateral aspect of the third TMT joint measuring up to 3 cm suspicious for phlegmon or developing abscess.  Additionally it was hard to definitively say that there were not additional sites of osteomyelitis in the midfoot.  His wound looked better on the 8th so he did not have further debridement.  Patient reports that since his last office debridement he has had improvement in the swelling and drainage from the wound.  He has been tolerating Levaquin without issues.  He has enough to last him through the weekend.  He has no known drug allergies.  Other than the issues with his wound he has been doing well and without fevers, chills, nausea, vomiting, diarrhea.  He is unsure what his last hemoglobin A1c was.  He has been back at work and on his feet.  Patient's Medications  New  Prescriptions   No medications on file  Previous Medications   ASCORBIC ACID (VITAMIN C) 500 MG TABLET    Take by mouth daily.    ASPIRIN EC 81 MG TABLET    Take 81 mg by mouth daily.   ATORVASTATIN (LIPITOR) 40 MG TABLET    TAKE 1 AND 1/2 TABLETS BY MOUTH EVERY MORNING   CYANOCOBALAMIN (VITAMIN B-12) 5000 MCG SUBL    Place 5,000 mcg under the tongue daily.   FENOFIBRATE (TRICOR) 48 MG TABLET    TAKE 1 TABLET(48 MG) BY MOUTH DAILY   FERROUS SULFATE 325 (65 FE) MG TABLET    Take 1 tablet (325 mg total) by mouth 2 (two) times daily with a meal.   GLUCOSE BLOOD  (ACCU-CHEK GUIDE) TEST STRIP    Dx E11.49 - Use to check blood glucose three times daily or as directed.   HYDROCHLOROTHIAZIDE (MICROZIDE) 12.5 MG CAPSULE    TAKE 1 CAPSULE(12.5 MG) BY MOUTH DAILY IN THE MORNING   INSULIN ASPART (NOVOLOG) 100 UNIT/ML INJECTION    Inject 15-25 units nto the skin 2 (two) times daily   INSULIN SYRINGE-NEEDLE U-100 (BD INSULIN SYRINGE U/F) 31G X 5/16" 0.5 ML MISC    Use to inject insulin 4 times daily   INSULIN SYRINGE-NEEDLE U-100 (BD VEO INSULIN SYR U/F 1/2UNIT) 31G X 15/64" 0.3 ML MISC    USE TO INJECT INSULIN FOUR TIMES DAILY AS DIRECTED   LANCETS MISC    USED TO CHECK BLOOD GLUCOSE 3-4 TIMES DAILY   LEVOFLOXACIN (LEVAQUIN) 500 MG TABLET    Take 1 tablet (500 mg total) by mouth daily.   MULTIPLE VITAMIN (MULTIVITAMIN WITH MINERALS) TABS TABLET    Take 1 tablet by mouth 2 (two) times daily.   OMEGA-3 FATTY ACIDS (FISH OIL) 1360 MG CAPS    Take 1,360 mg by mouth daily.    PYRIDOXINE (B-6) 100 MG TABLET    Take 100 mg by mouth 2 (two) times daily.   RAMIPRIL (ALTACE) 5 MG CAPSULE    TAKE 1 CAPSULE(5 MG) BY MOUTH EVERY MORNING   SILVER SULFADIAZINE (SILVADENE) 1 % CREAM    Apply pea-sized amount to wound daily.   SITAGLIPTIN (JANUVIA) 50 MG TABLET    Take 1 tablet (50 mg total) by mouth every morning.   TRESIBA 100 UNIT/ML SOLN    ADMINISTER 64 UNITS UNDER THE SKIN AT BEDTIME  Modified Medications   No medications on file  Discontinued Medications   DOXYCYCLINE (VIBRA-TABS) 100 MG TABLET    Take 1 tablet (100 mg total) by mouth 2 (two) times daily.   OXYCODONE-ACETAMINOPHEN (PERCOCET) 5-325 MG TABLET    Take 1 tablet by mouth every 4 (four) hours as needed for severe pain.      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   (06-17-2019 pt stated has been stable with no  changes and followed by pcp)   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[facility])    11-06-2016  lower extremity angiography (dr [name])  mil tibial disease w/ occlusion of the distal peroneal artery and dorsalis pedis artery but has widely patent posterior tibial and anterior tibial arterties   Type 2 diabetes mellitus treated with insulin ([place])    followed by pcp   (06-17-2019  pt stated checks blood sugar daily in am,  fasting sugar-- average 71)   Vitamin B 12 deficiency     Social History   Tobacco Use   Smoking status: Never Smoker   Smokeless tobacco: Never Used  Vaping Use   Vaping Use: Never used  Substance Use Topics   Alcohol use: No   Drug use: Never    Family History  Problem Relation Age of Onset   Stroke Father        early 58s. former smoker   Diabetes Father    Hypertension Father    Sarcoidosis Mother    Hypertension Sister    Healthy Daughter    Diabetes Paternal Grandmother    Congenital heart disease Paternal Grandmother        died of complications    No Known Allergies  Review of Systems: Review of Systems  Constitutional: Negative.   Respiratory: Negative.   Cardiovascular: Negative.   Musculoskeletal:       Right foot wound   Skin: Negative.   All other systems reviewed and are negative.   Objective: Vitals:   11/09/19 1508  BP: (!) 141/93  Pulse: 95  Temp: 98.5 F (36.9 C)  TempSrc: Oral  SpO2: 99%  Weight: (!) 313 lb (142 kg)  Height: 6' (1.829 m)     Body mass index is 42.45 kg/m.  Physical Exam Vitals reviewed.  Constitutional:      General: He is not in acute  distress.    Appearance: Normal appearance.  Pulmonary:     Effort: Pulmonary effort is normal. No respiratory distress.  Skin:    General: Skin is warm and dry.  Neurological:     General: No focal deficit present.     Mental Status: He is alert and oriented to person, place, and time.  Psychiatric:        Mood and Affect: Mood normal.        Behavior: Behavior normal.       CBC Latest Ref Rng & Units 06/14/2019 01/17/2019 10/15/2018  WBC 4.0 - 10.5 K/uL 12.0(H) 10.3 8.0  Hemoglobin 13.0 - 17.0 g/dL 11.7(L) 12.9(A) 13.0  Hematocrit 39 - 52 % 35.9(L) 42 39.6  Platelets 150 - 400 K/uL 390.0 - 305.0   CMP Latest Ref Rng & Units 06/14/2019 01/17/2019 10/15/2018  Glucose 70 - 99 mg/dL 149(H) - 84  BUN 6 - 23 mg/dL 27(H) 14 22  Creatinine 0.40 - 1.50 mg/dL 1.74(H) 1.7(A) 1.59(H)  Sodium 135 - 145 mEq/L 136 - 139  Potassium 3.5 - 5.1 mEq/L 4.7 - 3.8  Chloride 96 - 112 mEq/L 102 104 101  CO2 19 - 32 mEq/L 29 - 30  Calcium 8.4 - 10.5 mg/dL 9.7 9.7 10.2  Total Protein 6.0 - 8.3 g/dL 7.7 - 7.9  Total Bilirubin 0.2 - 1.2 mg/dL 0.5 - 0.8  Alkaline Phos 39 - 117 U/L 97 75 66  AST 0 - 37 U/L 26 43(A) 34  ALT 0 - 53 U/L 35 57(A) 48     No results found for this or any previous visit (from  the past 240 hour(s)).  Imaging: MRI 10/7 1. Acute osteomyelitis of the third metatarsal base extending to the level of the mid diaphysis. 2. Ulceration along the plantar surface of the lateral midfoot with ill-defined fluid collection underlying the lateral aspect of the third TMT joint measuring up to 3.0 cm suspicious for phlegmon/developing abscess. 3. Charcot arthropathy throughout the midfoot with bony destruction and erosions, most pronounced at the medial and intermediate cuneiforms as well as at the second metatarsal base. There is significant overlap of imaging findings for Charcot and septic arthritis and additional sites of osteomyelitis within the midfoot would be difficult to exclude  by imaging alone.  Assessment and Plan: Osteomyelitis of ankle or foot, acute, right ([place]) I agree with Dr. March Rummage that patient will benefit from surgical intervention with further debridement and bone cultures/biopsy to help further guide antibiotic therapy.  Will attempt to discuss with him regarding the timing of this.  Patient is currently systemically well without fevers or chills so ideally we could obtain these cultures off of antibiotics.  Unless he has all residual infected bone tissue removed he will likely require several weeks of IV antibiotics.  He continues on Levaquin for now.  Surprisingly he has grown pretty sensitive organisms in the past from his cultures.  Ultimately, this will be quite challenging and require a multifaceted approach of surgery, antibiotics, off-loading, wound care, and glycemic control.  He had vascular studies done about 3 years ago which did show he should have ability to heal his wounds okay.     1. Stage 3 chronic kidney disease Check labs today  2. Diabetic polyneuropathy associated with type 2 diabetes mellitus ([place]) This is complicating his ability to heal  3. Hypertension associated with diabetes ([place]) BP slightly elevated today  4. Type II diabetes mellitus with neurological manifestations ([place]) Check A1c    Orders Placed This Encounter  Procedures   CBC w/Diff   COMPLETE METABOLIC PANEL WITH GFR   Sedimentation rate   C-reactive protein   HgB A1c     I spent greater than 79 minutes on the patient including greater than 50% of time in face to face counsel of the patient and in coordination of their care.    Raynelle [facility] for Infectious [facility] 11/09/2019, 5:08 PM

## 2019-11-09 NOTE — Assessment & Plan Note (Addendum)
I agree with Dr. March Rummage that patient will benefit from surgical intervention with further debridement and bone cultures/biopsy to help further guide antibiotic therapy.  Will attempt to discuss with him regarding the timing of this.  Patient is currently systemically well without fevers or chills so ideally we could obtain these cultures off of antibiotics.  Unless he has all residual infected bone tissue removed he will likely require several weeks of IV antibiotics.  He continues on Levaquin for now.  Surprisingly he has grown pretty sensitive organisms in the past from his cultures.  Ultimately, this will be quite challenging and require a multifaceted approach of surgery, antibiotics, off-loading, wound care, and glycemic control.  He had vascular studies done about 3 years ago which did show he should have ability to heal his wounds okay.

## 2019-11-10 ENCOUNTER — Ambulatory Visit: Payer: BLUE CROSS/BLUE SHIELD | Admitting: Podiatry

## 2019-11-10 DIAGNOSIS — M14671 Charcot's joint, right ankle and foot: Secondary | ICD-10-CM

## 2019-11-10 DIAGNOSIS — M86171 Other acute osteomyelitis, right ankle and foot: Secondary | ICD-10-CM

## 2019-11-10 DIAGNOSIS — E08621 Diabetes mellitus due to underlying condition with foot ulcer: Secondary | ICD-10-CM | POA: Diagnosis not present

## 2019-11-10 DIAGNOSIS — L97513 Non-pressure chronic ulcer of other part of right foot with necrosis of muscle: Secondary | ICD-10-CM | POA: Diagnosis not present

## 2019-11-10 LAB — CBC WITH DIFFERENTIAL/PLATELET
Absolute Monocytes: 490 cells/uL (ref 200–950)
Basophils Absolute: 30 cells/uL (ref 0–200)
Basophils Relative: 0.3 %
Eosinophils Absolute: 230 cells/uL (ref 15–500)
Eosinophils Relative: 2.3 %
HCT: 40.1 % (ref 38.5–50.0)
Hemoglobin: 13.2 g/dL (ref 13.2–17.1)
Lymphs Abs: 2370 cells/uL (ref 850–3900)
MCH: 26.5 pg — ABNORMAL LOW (ref 27.0–33.0)
MCHC: 32.9 g/dL (ref 32.0–36.0)
MCV: 80.4 fL (ref 80.0–100.0)
MPV: 9.3 fL (ref 7.5–12.5)
Monocytes Relative: 4.9 %
Neutro Abs: 6880 cells/uL (ref 1500–7800)
Neutrophils Relative %: 68.8 %
Platelets: 321 10*3/uL (ref 140–400)
RBC: 4.99 10*6/uL (ref 4.20–5.80)
RDW: 12.1 % (ref 11.0–15.0)
Total Lymphocyte: 23.7 %
WBC: 10 10*3/uL (ref 3.8–10.8)

## 2019-11-10 LAB — COMPLETE METABOLIC PANEL WITH GFR
AG Ratio: 1.2 (calc) (ref 1.0–2.5)
ALT: 31 U/L (ref 9–46)
AST: 27 U/L (ref 10–40)
Albumin: 4.1 g/dL (ref 3.6–5.1)
Alkaline phosphatase (APISO): 65 U/L (ref 36–130)
BUN/Creatinine Ratio: 17 (calc) (ref 6–22)
BUN: 25 mg/dL (ref 7–25)
CO2: 28 mmol/L (ref 20–32)
Calcium: 10 mg/dL (ref 8.6–10.3)
Chloride: 105 mmol/L (ref 98–110)
Creat: 1.48 mg/dL — ABNORMAL HIGH (ref 0.60–1.35)
GFR, Est African American: 63 mL/min/{1.73_m2} (ref >=60)
GFR, Est Non African American: 55 mL/min/{1.73_m2} — ABNORMAL LOW (ref >=60)
Globulin: 3.5 g/dL (calc) (ref 1.9–3.7)
Glucose, Bld: 137 mg/dL — ABNORMAL HIGH (ref 65–99)
Potassium: 3.8 mmol/L (ref 3.5–5.3)
Sodium: 139 mmol/L (ref 135–146)
Total Bilirubin: 1 mg/dL (ref 0.2–1.2)
Total Protein: 7.6 g/dL (ref 6.1–8.1)

## 2019-11-10 LAB — HEMOGLOBIN A1C
Hgb A1c MFr Bld: 6.7 % of total Hgb — ABNORMAL HIGH (ref <5.7)
Mean Plasma Glucose: 146 (calc)
eAG (mmol/L): 8.1 (calc)

## 2019-11-10 LAB — SEDIMENTATION RATE: Sed Rate: 14 mm/h (ref 0–15)

## 2019-11-10 LAB — C-REACTIVE PROTEIN: CRP: 9.7 mg/L — ABNORMAL HIGH (ref <8.0)

## 2019-11-14 ENCOUNTER — Encounter: Payer: Self-pay | Admitting: Podiatry

## 2019-11-14 NOTE — Progress Notes (Signed)
  Subjective:  Patient ID: [name], male    DOB: April 26, 1970,  MRN: [id number]  Chief Complaint  Patient presents with  . Wound Check    PT STATES THAT WOUND SEEMS TO BE HEALING PROPERLY    49 y.o. male presents for wound care. Hx confirmed with patient. States the wound is looking and feeling much better. Objective:  Physical Exam: Wound Location: Right foot Wound Measurement: 1.8 cm x 1.3 cm x 0.4 cm Wound Base: Fibrotic slough Peri-wound: Macerated Exudate: Moderate amount Serosanguinous exudate No further warmth and erythema, still POP  MRI 10/7 1. Acute osteomyelitis of the third metatarsal base extending to the level of the mid diaphysis. 2. Ulceration along the plantar surface of the lateral midfoot with ill-defined fluid collection underlying the lateral aspect of the third TMT joint measuring up to 3.0 cm suspicious for phlegmon/developing abscess. 3. Charcot arthropathy throughout the midfoot with bony destruction and erosions, most pronounced at the medial and intermediate cuneiforms as well as at the second metatarsal base. There is significant overlap of imaging findings for Charcot and septic arthritis and additional sites of osteomyelitis within the midfoot would be difficult to exclude by imaging alone. Assessment:   1. Charcot's joint of foot, right   2. Diabetic ulcer of other part of right foot associated with diabetes mellitus due to underlying condition, with necrosis of muscle ([place])   3. Acute osteomyelitis of right ankle or foot ([place])    Plan:  Patient was evaluated and treated and all questions answered.  Ulcer Right foot -MRI results reviewed with patient. -Wound much improved, no need for debridement today. -Culture finalized with Proteus mirabilis and it is pansensitive.  Antibiotics regimen per infectious disease. -Continue dressing changes as per Dr. March Rummage. -I discussed with infectious disease that Dr. March Rummage will likely plan for  surgical intervention in 2 to 3 weeks.  They are aware of the surgical plan. -The wound is is improving and is regressing -Patient will follow up with Dr. March Rummage next week.

## 2019-11-15 ENCOUNTER — Ambulatory Visit: Payer: BC Managed Care – PPO | Admitting: Podiatry

## 2019-11-17 ENCOUNTER — Ambulatory Visit: Payer: BC Managed Care – PPO

## 2019-11-18 ENCOUNTER — Other Ambulatory Visit: Payer: Self-pay

## 2019-11-18 ENCOUNTER — Ambulatory Visit: Payer: BLUE CROSS/BLUE SHIELD | Admitting: Podiatry

## 2019-11-18 DIAGNOSIS — E08621 Diabetes mellitus due to underlying condition with foot ulcer: Secondary | ICD-10-CM | POA: Diagnosis not present

## 2019-11-18 DIAGNOSIS — L03115 Cellulitis of right lower limb: Secondary | ICD-10-CM | POA: Diagnosis not present

## 2019-11-18 DIAGNOSIS — M14671 Charcot's joint, right ankle and foot: Secondary | ICD-10-CM | POA: Diagnosis not present

## 2019-11-18 DIAGNOSIS — E11621 Type 2 diabetes mellitus with foot ulcer: Secondary | ICD-10-CM | POA: Insufficient documentation

## 2019-11-18 DIAGNOSIS — L97513 Non-pressure chronic ulcer of other part of right foot with necrosis of muscle: Secondary | ICD-10-CM

## 2019-11-18 DIAGNOSIS — M86171 Other acute osteomyelitis, right ankle and foot: Secondary | ICD-10-CM | POA: Diagnosis not present

## 2019-11-18 NOTE — Progress Notes (Signed)
  Subjective:  Patient ID: [name], male    DOB: 10/29/1970,  MRN: [id number]  Chief Complaint  Patient presents with  . Wound Check    wound care- slowly improving   49 y.o. male presents for wound care. Hx confirmed with patient. Denies issues, states the wound is slowly improving Objective:  Physical Exam: Wound Location: Right foot Wound Measurement: 1.8 cm x 1.3 cm x 0.4 cm Wound Base: Fibrotic slough Peri-wound: Macerated Exudate: Moderate amount Serosanguinous exudate No further warmth and erythema, still POP  MRI 10/7 1. Acute osteomyelitis of the third metatarsal base extending to the level of the mid diaphysis. 2. Ulceration along the plantar surface of the lateral midfoot with ill-defined fluid collection underlying the lateral aspect of the third TMT joint measuring up to 3.0 cm suspicious for phlegmon/developing abscess. 3. Charcot arthropathy throughout the midfoot with bony destruction and erosions, most pronounced at the medial and intermediate cuneiforms as well as at the second metatarsal base. There is significant overlap of imaging findings for Charcot and septic arthritis and additional sites of osteomyelitis within the midfoot would be difficult to exclude by imaging alone. Assessment:   1. 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 Charcot's joint of foot, right   3. Acute osteomyelitis of right ankle or foot ([facility])   4. Cellulitis of foot, right    Plan:  Patient was evaluated and treated and all questions answered.  Ulcer Right foot -Wound improving but still concern for osteomyelitis. -Dressed with silvadene and DSD today -Plan for operative debridement next week. -Patient has failed all conservative therapy and wishes to proceed with surgical intervention. All risks, benefits, and alternatives discussed with patient. No guarantees given.  Consent reviewed and signed by patient. -Planned procedures: right foot wound debridement, incision and drainage of bone with bone debridement, possible insertion of abx beads, bone biopsy.

## 2019-11-21 ENCOUNTER — Telehealth: Payer: Self-pay

## 2019-11-21 NOTE — Telephone Encounter (Signed)
DOS 11/23/2019  WOUND DEBRIDEMENT RT - 11043 I & D BONE CORTEX RT - 28005 BONE BIOPSY RT - 20245 INSERTION OF ANTIBIOTIC BEADS RT - 09030  [employer] EFFECTIVE DATE - 11/09/2019  PLAN DEDUCTIBLE - $750.00 W/ $750.00 REMAINING OUT OF POCKET - $3050.00 W/ $3050.00 REMAINING COPAY $0.00 COINSURANCE - 20%  PER KENNY R AT [facility] AUTH REQUIRED FOR CPT 28005, [id number], [id number] & [id number]. CALL REF # [place] R 11/21/2019

## 2019-11-21 NOTE — Progress Notes (Signed)
  Subjective:  Patient ID: [name], male    DOB: 1970/10/15,  MRN: [id number]  No chief complaint on file.  49 y.o. male presents for wound care. Hx confirmed with patient.  States the wound continues to improve he has no concerns today Objective:  Physical Exam: Wound Location: Right foot Wound Measurement: 2x1.5x0.5 Wound Base: Fibrotic slough Peri-wound: Macerated Exudate: Moderate amount Serosanguinous exudate No further warmth and erythema, still POP  MRI 10/7 1. Acute osteomyelitis of the third metatarsal base extending to the level of the mid diaphysis. 2. Ulceration along the plantar surface of the lateral midfoot with ill-defined fluid collection underlying the lateral aspect of the third TMT joint measuring up to 3.0 cm suspicious for phlegmon/developing abscess. 3. Charcot arthropathy throughout the midfoot with bony destruction and erosions, most pronounced at the medial and intermediate cuneiforms as well as at the second metatarsal base. There is significant overlap of imaging findings for Charcot and septic arthritis and additional sites of osteomyelitis within the midfoot would be difficult to exclude by imaging alone. Assessment:   No diagnosis found. Plan:  Patient was evaluated and treated and all questions answered.  Ulcer Right foot -Continue Levaquin.  Refill today wound does appear improved.  We will plan for surgical invention in the coming weeks -Dressed with Betadine wet-to-dry today -Discussed that he will not be able to wear a regular shoe for a while the wound is healing

## 2019-11-23 ENCOUNTER — Encounter: Payer: Self-pay | Admitting: Podiatry

## 2019-11-23 ENCOUNTER — Ambulatory Visit (HOSPITAL_BASED_OUTPATIENT_CLINIC_OR_DEPARTMENT_OTHER): Admission: RE | Admit: 2019-11-23 | Payer: BLUE CROSS/BLUE SHIELD | Source: Home / Self Care | Admitting: Podiatry

## 2019-11-23 ENCOUNTER — Other Ambulatory Visit: Payer: Self-pay | Admitting: Podiatry

## 2019-11-23 ENCOUNTER — Encounter (HOSPITAL_BASED_OUTPATIENT_CLINIC_OR_DEPARTMENT_OTHER): Admission: RE | Payer: Self-pay | Source: Home / Self Care

## 2019-11-23 DIAGNOSIS — M86171 Other acute osteomyelitis, right ankle and foot: Secondary | ICD-10-CM | POA: Diagnosis not present

## 2019-11-23 DIAGNOSIS — L97513 Non-pressure chronic ulcer of other part of right foot with necrosis of muscle: Secondary | ICD-10-CM

## 2019-11-23 DIAGNOSIS — M14671 Charcot's joint, right ankle and foot: Secondary | ICD-10-CM

## 2019-11-23 DIAGNOSIS — E11621 Type 2 diabetes mellitus with foot ulcer: Secondary | ICD-10-CM | POA: Insufficient documentation

## 2019-11-23 SURGERY — DEBRIDEMENT, WOUND
Anesthesia: Monitor Anesthesia Care | Laterality: Right

## 2019-11-23 MED ORDER — LEVOFLOXACIN 500 MG PO TABS
500.0000 mg | ORAL_TABLET | Freq: Every day | ORAL | 0 refills | Status: DC
Start: 2019-11-23 — End: 2019-12-03

## 2019-11-23 MED ORDER — HYDROCODONE-ACETAMINOPHEN 5-325 MG PO TABS: 1.0000 | ORAL_TABLET | Freq: Four times a day (QID) | ORAL | 0 refills | Status: DC | PRN

## 2019-11-23 NOTE — Progress Notes (Signed)
Rx sent to pharmacy for outpatient surgery. °

## 2019-11-24 ENCOUNTER — Other Ambulatory Visit: Payer: Self-pay

## 2019-11-24 ENCOUNTER — Ambulatory Visit (INDEPENDENT_AMBULATORY_CARE_PROVIDER_SITE_OTHER): Payer: BLUE CROSS/BLUE SHIELD

## 2019-11-24 DIAGNOSIS — Z23 Encounter for immunization: Secondary | ICD-10-CM

## 2019-11-25 ENCOUNTER — Ambulatory Visit: Payer: BC Managed Care – PPO | Admitting: Podiatry

## 2019-11-28 ENCOUNTER — Encounter: Payer: Self-pay | Admitting: Podiatry

## 2019-11-29 ENCOUNTER — Ambulatory Visit (INDEPENDENT_AMBULATORY_CARE_PROVIDER_SITE_OTHER): Payer: BLUE CROSS/BLUE SHIELD

## 2019-11-29 ENCOUNTER — Ambulatory Visit (INDEPENDENT_AMBULATORY_CARE_PROVIDER_SITE_OTHER): Payer: BLUE CROSS/BLUE SHIELD | Admitting: Podiatry

## 2019-11-29 ENCOUNTER — Other Ambulatory Visit: Payer: Self-pay

## 2019-11-29 ENCOUNTER — Ambulatory Visit: Payer: Self-pay | Admitting: Podiatry

## 2019-11-29 DIAGNOSIS — L97513 Non-pressure chronic ulcer of other part of right foot with necrosis of muscle: Secondary | ICD-10-CM

## 2019-11-29 DIAGNOSIS — E08621 Diabetes mellitus due to underlying condition with foot ulcer: Secondary | ICD-10-CM

## 2019-11-29 NOTE — Progress Notes (Signed)
°  Subjective:  Patient ID: Myshawn Misty Foutz [name], male    DOB: Jun 14, 1970,  MRN: [id number]  No chief complaint on file.   DOS: 11/23/19 Procedure: Right foot saucerization of bone for osteomyelitis, insertion of abx beads, bone biopsy   49 y.o. male presents with the above complaint. History confirmed with patient. Doing well does have surgical pain but is not taking pain medication  Objective:  Physical Exam: tenderness at the surgical site, local edema noted and calf supple, nontender. Incision: healing well, no significant drainage, no dehiscence, no significant erythema  No images are attached to the encounter.  Radiographs: X-ray of the right foot: consistent with post-op state with beads present in wound   Assessment:   1. Diabetic ulcer of other part of right foot associated with diabetes mellitus due to underlying condition, with necrosis of muscle ([place])     Plan:  Patient was evaluated and treated and all questions answered.  Post-operative State -XR reviewed with patient -Dressing applied consisting of silvadene, sterile gauze, kerlix and ACE bandage -WBAT in CAM boot  -Awaiting culture results. Has ID appt next week.  No follow-ups on file.

## 2019-11-30 ENCOUNTER — Other Ambulatory Visit: Payer: Self-pay | Admitting: Family Medicine

## 2019-11-30 ENCOUNTER — Ambulatory Visit: Payer: Self-pay | Admitting: Internal Medicine

## 2019-12-02 ENCOUNTER — Other Ambulatory Visit: Payer: Self-pay | Admitting: Podiatry

## 2019-12-02 NOTE — Telephone Encounter (Signed)
Please advise 

## 2019-12-06 ENCOUNTER — Encounter: Payer: BLUE CROSS/BLUE SHIELD | Admitting: Podiatry

## 2019-12-09 ENCOUNTER — Telehealth: Payer: Self-pay

## 2019-12-09 ENCOUNTER — Other Ambulatory Visit: Payer: Self-pay

## 2019-12-09 ENCOUNTER — Ambulatory Visit (INDEPENDENT_AMBULATORY_CARE_PROVIDER_SITE_OTHER): Payer: BLUE CROSS/BLUE SHIELD | Admitting: Podiatry

## 2019-12-09 ENCOUNTER — Encounter: Payer: Self-pay | Admitting: Internal Medicine

## 2019-12-09 ENCOUNTER — Ambulatory Visit (INDEPENDENT_AMBULATORY_CARE_PROVIDER_SITE_OTHER): Payer: BLUE CROSS/BLUE SHIELD | Admitting: Internal Medicine

## 2019-12-09 ENCOUNTER — Other Ambulatory Visit (HOSPITAL_COMMUNITY): Payer: Self-pay | Admitting: *Deleted

## 2019-12-09 VITALS — BP 143/88 | HR 78 | Temp 98.1°F | Ht 72.0 in | Wt 313.0 lb

## 2019-12-09 DIAGNOSIS — L97513 Non-pressure chronic ulcer of other part of right foot with necrosis of muscle: Secondary | ICD-10-CM

## 2019-12-09 DIAGNOSIS — M86171 Other acute osteomyelitis, right ankle and foot: Secondary | ICD-10-CM

## 2019-12-09 DIAGNOSIS — E08621 Diabetes mellitus due to underlying condition with foot ulcer: Secondary | ICD-10-CM

## 2019-12-09 DIAGNOSIS — M14671 Charcot's joint, right ankle and foot: Secondary | ICD-10-CM

## 2019-12-09 NOTE — Assessment & Plan Note (Signed)
Patient underwent right foot saucerization of bone for osteomyelitis, insertion of abx beads, bone biopsy on 11/23/19 with Dr March Rummage.  He has been on levaquin since that time and doing well.  No issues related to abx and appears to be healing well from surgery.  Pathology from surgery confirmed osteomyelitis.  Cultures obtained are still pending, however.  Given that patient likely has residual infected bone tissue from surgery, I favor a more aggressive approach with IV antibiotics for several weeks to hopefully eradicate this infection.  Given no cultures available will plan empiric coverage for GNR and MRSA.  Most recent GFR is >60.  Plan: -- PICC ordered today -- Daptomycin 85m IV daily -- Ceftriaxone 2gm IV daily -- CBC, CMP, ESR, CRP, CK today and weekly on IV therapy -- Continue Levaquin until PICC arranged -- RTC 4 weeks

## 2019-12-09 NOTE — Progress Notes (Signed)
[facility]  Chief Complaint:  Follow up for osteomyelitis  Subjective: [name] is a 49 y.o. male with PMHx as below who presents to the clinic for follow up of right foot osteomyelitis.   Please see A&P for the details of today's visit and status of the patient's medical problems.   Patient's Medications  New Prescriptions   No medications on file  Previous Medications   ASCORBIC ACID (VITAMIN C) 500 MG TABLET    Take by mouth daily.    ASPIRIN EC 81 MG TABLET    Take 81 mg by mouth daily.   ATORVASTATIN (LIPITOR) 40 MG TABLET    TAKE 1 AND 1/2 TABLETS BY MOUTH EVERY MORNING   CYANOCOBALAMIN (VITAMIN B-12) 5000 MCG SUBL    Place 5,000 mcg under the tongue daily.   FENOFIBRATE (TRICOR) 48 MG TABLET    TAKE 1 TABLET(48 MG) BY MOUTH DAILY   FERROUS SULFATE 325 (65 FE) MG TABLET    Take 1 tablet (325 mg total) by mouth 2 (two) times daily with a meal.   GLUCOSE BLOOD (ACCU-CHEK GUIDE) TEST STRIP    Dx E11.49 - Use to check blood glucose three times daily or as directed.   HYDROCHLOROTHIAZIDE (MICROZIDE) 12.5 MG CAPSULE    TAKE 1 CAPSULE(12.5 MG) BY MOUTH DAILY IN THE MORNING   HYDROCODONE-ACETAMINOPHEN (NORCO) 5-325 MG TABLET    Take 1 tablet by mouth every 6 (six) hours as needed for moderate pain.   INSULIN ASPART (NOVOLOG) 100 UNIT/ML INJECTION    INJECT 15 TO 25 UNITS UNDER THE SKIN TWICE DAILY   INSULIN SYRINGE-NEEDLE U-100 (BD INSULIN SYRINGE U/F) 31G X 5/16" 0.5 ML MISC    Use to inject insulin 4 times daily   INSULIN SYRINGE-NEEDLE U-100 (BD VEO INSULIN SYR U/F 1/2UNIT) 31G X 15/64" 0.3 ML MISC    USE TO INJECT INSULIN FOUR TIMES DAILY AS DIRECTED   LANCETS MISC    USED TO CHECK BLOOD GLUCOSE 3-4 TIMES DAILY   LEVOFLOXACIN (LEVAQUIN) 500 MG TABLET    TAKE 1 TABLET(500 MG) BY MOUTH DAILY   MULTIPLE VITAMIN (MULTIVITAMIN WITH MINERALS) TABS TABLET    Take 1 tablet by mouth 2 (two) times daily.   OMEGA-3 FATTY ACIDS (FISH OIL) 1360  MG CAPS    Take 1,360 mg by mouth daily.    PYRIDOXINE (B-6) 100 MG TABLET    Take 100 mg by mouth 2 (two) times daily.   RAMIPRIL (ALTACE) 5 MG CAPSULE    TAKE 1 CAPSULE(5 MG) BY MOUTH EVERY MORNING   SILVER SULFADIAZINE (SILVADENE) 1 % CREAM    Apply pea-sized amount to wound daily.   SITAGLIPTIN (JANUVIA) 50 MG TABLET    Take 1 tablet (50 mg total) by mouth every morning.   TRESIBA 100 UNIT/ML SOLN    ADMINISTER 64 UNITS UNDER THE SKIN AT BEDTIME  Modified Medications   No medications on file  Discontinued Medications   No medications on file     Past Medical History:  Diagnosis Date  . Cellulitis and abscess of foot    05/ 2021 left  . CKD (chronic kidney disease), stage III ([place]) 06-17-2019 per pt last visit 05-29-2016 (note in care everywhere)  currently followed by pcp   nephrologist-  dr [name] ([facility] in [place])    . Diabetic peripheral neuropathy ([place])   . Elevated LFTs    followed by pcp  .  Hypertension    followed by pcp---- (06-17-2019 pt stated never had a stress test)  . Iron deficiency anemia   . Mixed hyperlipidemia   . 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 Type 2 diabetes mellitus treated with insulin ([place])    followed by pcp   (06-17-2019  pt stated checks blood sugar daily in am,  fasting sugar-- average 71)  . Vitamin B 12 deficiency     Family History  Problem Relation Age of Onset  . Stroke Father        early 34s. former smoker  . Diabetes Father   . Hypertension Father   . Sarcoidosis Mother   . Hypertension Sister   . Healthy Daughter   . Diabetes Paternal  Grandmother   . Congenital heart disease Paternal Grandmother        died of complications    Social History   Socioeconomic History  . Marital status: Married    Spouse name: Not on file  . Number of children: Not on file  . Years of education: Not on file  . Highest education level: Not on file  Occupational History  . Not on file  Tobacco Use  . Smoking status: Never Smoker  . Smokeless tobacco: Never Used  Vaping Use  . Vaping Use: Never used  Substance and Sexual Activity  . Alcohol use: No  . Drug use: Never  . Sexual activity: Yes  Other Topics Concern  . Not on file  Social History Narrative   Married. 50 year old daughter 10/2016.       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       Hobbies: drawing, archery, tinkering   Social Determinants of Health   Financial Resource Strain:   . Difficulty of Paying Living Expenses: Not on file  Food Insecurity:   . Worried About Charity fundraiser in the Last Year: Not on file  . Ran Out of Food in the Last Year: Not on file  Transportation Needs:   . Lack of Transportation (Medical): Not on file  . Lack of Transportation (Non-Medical): Not on file  Physical Activity:   . Days of Exercise per Week: Not on file  . Minutes of Exercise per Session: Not on file  Stress:   . Feeling of Stress : Not on file  Social Connections:   . Frequency of Communication with Friends and Family: Not on file  . Frequency of Social Gatherings with Friends and Family: Not on file  . Attends Religious Services: Not on file  . Active Member of Clubs or Organizations: Not on file  . Attends Archivist Meetings: Not on file  . Marital Status: Not on file  Intimate Partner Violence:   . Fear of Current or Ex-Partner: Not on file  . Emotionally Abused: Not on file  . Physically Abused: Not on file  . Sexually Abused: Not on file    No Known  Allergies   Review of Systems: Review of Systems  Constitutional: Negative.   Respiratory: Negative.  Cardiovascular: Negative.   Gastrointestinal: Negative.   Musculoskeletal: Negative for joint pain.  Skin:       Healing surgical wound.     Objective: Vitals:   12/09/19 0856  BP: (!) 143/88  Pulse: 78  Temp: 98.1 F (36.7 C)  TempSrc: Oral  SpO2: 98%  Weight: (!) 313 lb (142 kg)  Height: 6' (1.829 m)   Body mass index is 42.45 kg/m.  Physical Exam Constitutional:      General: He is not in acute distress.    Appearance: Normal appearance.  HENT:     Head: Normocephalic and atraumatic.  Musculoskeletal:     Comments: Post-surgical changes noted to right foot.  Appears to be healing fairly well. No purulence, erythema, or drainage.  Still has sutures in place.   Skin:    General: Skin is warm and dry.  Neurological:     General: No focal deficit present.     Mental Status: He is alert and oriented to person, place, and time.  Psychiatric:        Mood and Affect: Mood normal.        Behavior: Behavior normal.       Pertinent Labs and Microbiology CBC Latest Ref Rng & Units 11/09/2019 06/14/2019 01/17/2019  WBC 3.8 - 10.8 Thousand/uL 10.0 12.0(H) 10.3  Hemoglobin 13.2 - 17.1 g/dL 13.2 11.7(L) 12.9(A)  Hematocrit 38 - 50 % 40.1 35.9(L) 42  Platelets 140 - 400 Thousand/uL 321 390.0 -   CMP Latest Ref Rng & Units 11/09/2019 06/14/2019 01/17/2019  Glucose 65 - 99 mg/dL 137(H) 149(H) -  BUN 7 - 25 mg/dL 25 27(H) 14  Creatinine 0.60 - 1.35 mg/dL 1.48(H) 1.74(H) 1.7(A)  Sodium 135 - 146 mmol/L 139 136 -  Potassium 3.5 - 5.3 mmol/L 3.8 4.7 -  Chloride 98 - 110 mmol/L 105 102 104  CO2 20 - 32 mmol/L 28 29 -  Calcium 8.6 - 10.3 mg/dL 10.0 9.7 9.7  Total Protein 6.1 - 8.1 g/dL 7.6 7.7 -  Total Bilirubin 0.2 - 1.2 mg/dL 1.0 0.5 -  Alkaline Phos 39 - 117 U/L - 97 75  AST 10 - 40 U/L 27 26 43(A)  ALT 9 - 46 U/L 31 35 57(A)     Assessment and  Plan: Osteomyelitis of ankle or foot, acute, right (HCC) Patient underwent right foot saucerization of bone for osteomyelitis, insertion of abx beads, bone biopsy on 11/23/19 with Dr March Rummage.  He has been on levaquin since that time and doing well.  No issues related to abx and appears to be healing well from surgery.  Pathology from surgery confirmed osteomyelitis.  Cultures obtained are still pending, however.  Given that patient likely has residual infected bone tissue from surgery, I favor a more aggressive approach with IV antibiotics for several weeks to hopefully eradicate this infection.  Given no cultures available will plan empiric coverage for GNR and MRSA.  Most recent GFR is >60.  Plan: -- PICC ordered today -- Daptomycin 849m IV daily -- Ceftriaxone 2gm IV daily -- CBC, CMP, ESR, CRP, CK today and weekly on IV therapy -- Continue Levaquin until PICC arranged -- RTC 4 weeks    Orders Placed This Encounter  Procedures  . IBellflowerLEFT >5 YRS INC IMG GUIDE    Standing Status:   Future    Standing Expiration Date:   12/08/2020    Order Specific Question:   Reason for Exam (SYMPTOM  OR DIAGNOSIS REQUIRED)  Answer:   need for IV abx    Order Specific Question:   Preferred Imaging Location?    Answer:   [facility]  . CBC w/Diff  . COMPLETE METABOLIC PANEL WITH GFR  . Sedimentation rate  . C-reactive protein  . CK (Creatine Kinase)    I spent greater than 30 minutes with the patient including greater than 50% of time in face to face counsel of the patient and/or in coordination of their care.   Raynelle [facility] for Infectious [facility] 12/09/2019, 9:45 AM

## 2019-12-09 NOTE — Telephone Encounter (Signed)
Spoke with Debbie with [facility] and advised her that orders will be faxed to her for the patient and advised her of patient's appointment with IR. Orders have been faxed to [facility] and to short stay. [name] T Brooks Sailors

## 2019-12-09 NOTE — Patient Instructions (Signed)
Thank you for coming to see me today. It was a pleasure seeing you.  We are going to arrange for IV antibiotics for several weeks.  I placed an order for a PICC line to be placed for your and home health will arrnage your antibiotics.  Please keep taking Levaquin until the PICC is placed.  If you have any questions or concerns, please do not hesitate to call the office at [phone].  Take Care,   Jule Ser, DO

## 2019-12-09 NOTE — Telephone Encounter (Signed)
Spoke with Anderson Malta at [facility] and patient is scheduled for PICC placement on 12/12/19 arrival time 11:30 am. Patient is aware of appointment date, time and location. Anderson Malta also advised patient will receive first dose at short stay. Laiana Fratus T Brooks Sailors

## 2019-12-10 LAB — CBC WITH DIFFERENTIAL/PLATELET
Absolute Monocytes: 470 cells/uL (ref 200–950)
Basophils Absolute: 29 cells/uL (ref 0–200)
Basophils Relative: 0.3 %
Eosinophils Absolute: 176 cells/uL (ref 15–500)
Eosinophils Relative: 1.8 %
HCT: 39.8 % (ref 38.5–50.0)
Hemoglobin: 13.1 g/dL — ABNORMAL LOW (ref 13.2–17.1)
Lymphs Abs: 2430 cells/uL (ref 850–3900)
MCH: 26.4 pg — ABNORMAL LOW (ref 27.0–33.0)
MCHC: 32.9 g/dL (ref 32.0–36.0)
MCV: 80.1 fL (ref 80.0–100.0)
MPV: 8.9 fL (ref 7.5–12.5)
Monocytes Relative: 4.8 %
Neutro Abs: 6693 cells/uL (ref 1500–7800)
Neutrophils Relative %: 68.3 %
Platelets: 350 10*3/uL (ref 140–400)
RBC: 4.97 10*6/uL (ref 4.20–5.80)
RDW: 12.1 % (ref 11.0–15.0)
Total Lymphocyte: 24.8 %
WBC: 9.8 10*3/uL (ref 3.8–10.8)

## 2019-12-10 LAB — COMPLETE METABOLIC PANEL WITH GFR
AG Ratio: 1.1 (calc) (ref 1.0–2.5)
ALT: 50 U/L — ABNORMAL HIGH (ref 9–46)
AST: 34 U/L (ref 10–40)
Albumin: 4.2 g/dL (ref 3.6–5.1)
Alkaline phosphatase (APISO): 87 U/L (ref 36–130)
BUN/Creatinine Ratio: 17 (calc) (ref 6–22)
BUN: 24 mg/dL (ref 7–25)
CO2: 28 mmol/L (ref 20–32)
Calcium: 9.8 mg/dL (ref 8.6–10.3)
Chloride: 102 mmol/L (ref 98–110)
Creat: 1.39 mg/dL — ABNORMAL HIGH (ref 0.60–1.35)
GFR, Est African American: 68 mL/min/{1.73_m2} (ref >=60)
GFR, Est Non African American: 59 mL/min/{1.73_m2} — ABNORMAL LOW (ref >=60)
Globulin: 3.8 g/dL (calc) — ABNORMAL HIGH (ref 1.9–3.7)
Glucose, Bld: 105 mg/dL — ABNORMAL HIGH (ref 65–99)
Potassium: 4 mmol/L (ref 3.5–5.3)
Sodium: 138 mmol/L (ref 135–146)
Total Bilirubin: 0.5 mg/dL (ref 0.2–1.2)
Total Protein: 8 g/dL (ref 6.1–8.1)

## 2019-12-10 LAB — SEDIMENTATION RATE: Sed Rate: 28 mm/h — ABNORMAL HIGH (ref 0–15)

## 2019-12-10 LAB — C-REACTIVE PROTEIN: CRP: 7.1 mg/L (ref <8.0)

## 2019-12-10 LAB — CK: Total CK: 277 U/L — ABNORMAL HIGH (ref 44–196)

## 2019-12-12 ENCOUNTER — Other Ambulatory Visit: Payer: Self-pay

## 2019-12-12 ENCOUNTER — Other Ambulatory Visit: Payer: Self-pay | Admitting: Internal Medicine

## 2019-12-12 ENCOUNTER — Ambulatory Visit (HOSPITAL_COMMUNITY)
Admission: RE | Admit: 2019-12-12 | Discharge: 2019-12-12 | Disposition: A | Discharge status: Home / Self Care | Payer: BLUE CROSS/BLUE SHIELD | Source: Ambulatory Visit | Attending: Internal Medicine | Admitting: Internal Medicine

## 2019-12-12 ENCOUNTER — Encounter (HOSPITAL_COMMUNITY)
Admission: RE | Admit: 2019-12-12 | Discharge: 2019-12-12 | Disposition: A | Discharge status: Home / Self Care | Payer: BLUE CROSS/BLUE SHIELD | Source: Ambulatory Visit | Attending: Internal Medicine | Admitting: Internal Medicine

## 2019-12-12 DIAGNOSIS — M86171 Other acute osteomyelitis, right ankle and foot: Secondary | ICD-10-CM | POA: Insufficient documentation

## 2019-12-12 DIAGNOSIS — M868X7 Other osteomyelitis, ankle and foot: Secondary | ICD-10-CM | POA: Diagnosis present

## 2019-12-12 MED ORDER — HEPARIN SOD (PORK) LOCK FLUSH 100 UNIT/ML IV SOLN: 250.0000 [IU] | INTRAVENOUS | Status: DC | PRN

## 2019-12-12 MED ORDER — SODIUM CHLORIDE 0.9 % IV SOLN
800.0000 mg | Freq: Once | INTRAVENOUS | Status: AC
  Administered 2019-12-12: 800 mg via INTRAVENOUS
  Filled 2019-12-12 (×2): qty 16

## 2019-12-12 MED ORDER — LIDOCAINE HCL (PF) 1 % IJ SOLN
INTRAMUSCULAR | Status: DC | PRN
  Administered 2019-12-12: 8 mL

## 2019-12-12 MED ORDER — SODIUM CHLORIDE 0.9 % IV SOLN
2.0000 g | Freq: Once | INTRAVENOUS | Status: AC
  Administered 2019-12-12: 2 g via INTRAVENOUS
  Filled 2019-12-12: qty 20

## 2019-12-12 MED ORDER — LIDOCAINE HCL 1 % IJ SOLN
INTRAMUSCULAR | Status: AC
  Filled 2019-12-12: qty 20

## 2019-12-12 MED ORDER — HEPARIN SOD (PORK) LOCK FLUSH 100 UNIT/ML IV SOLN
INTRAVENOUS | Status: AC
  Administered 2019-12-12: 250 [IU]
  Filled 2019-12-12: qty 5

## 2019-12-12 MED ORDER — HEPARIN SOD (PORK) LOCK FLUSH 100 UNIT/ML IV SOLN: 250.0000 [IU] | Freq: Every day | INTRAVENOUS | Status: DC

## 2019-12-12 NOTE — Procedures (Signed)
PROCEDURE SUMMARY:  Successful placement of image-guided single lumen PICC line to the right basilic vein. Length 40 cm. Tip at lower SVC/RA. No complications. EBL = < 2 ml. Ready for use.  Please see imaging section of Epic for full dictation.   Theresa Duty, NP 12/12/2019 12:37 PM

## 2019-12-20 ENCOUNTER — Other Ambulatory Visit: Payer: Self-pay

## 2019-12-20 ENCOUNTER — Ambulatory Visit (INDEPENDENT_AMBULATORY_CARE_PROVIDER_SITE_OTHER): Payer: BLUE CROSS/BLUE SHIELD

## 2019-12-20 ENCOUNTER — Ambulatory Visit (INDEPENDENT_AMBULATORY_CARE_PROVIDER_SITE_OTHER): Payer: BLUE CROSS/BLUE SHIELD | Admitting: Podiatry

## 2019-12-20 DIAGNOSIS — L97513 Non-pressure chronic ulcer of other part of right foot with necrosis of muscle: Secondary | ICD-10-CM

## 2019-12-20 DIAGNOSIS — E08621 Diabetes mellitus due to underlying condition with foot ulcer: Secondary | ICD-10-CM

## 2019-12-20 NOTE — Progress Notes (Signed)
  Subjective:  Patient ID: [name], male    DOB: [birth year],  MRN: [id number]  Chief Complaint  Patient presents with  . Routine Post Op    PT stated that he is doing well he has no concerns and denies pain but said he has some soreness     DOS: 11/23/19 Procedure: Right foot saucerization of bone for osteomyelitis, insertion of abx beads, bone biopsy   49 y.o. male presents with the above complaint. History confirmed with patient.  Doing rather well denies pain or issues with the foot.  Receiving antibiotics without issue.  Objective:  Physical Exam: tenderness at the surgical site, local edema noted and calf supple, nontender. Incision: healing well, no significant drainage, no dehiscence, no significant erythema  No images are attached to the encounter.  Radiographs: X-ray of the right foot: consistent with post-op state, beads appear resorbed no further evidence of osteomyelitis  Assessment:   1. Diabetic ulcer of other part of right foot associated with diabetes mellitus due to underlying condition, with necrosis of muscle ([place])     Plan:  Patient was evaluated and treated and all questions answered.  Post-operative State -XR reviewed with patient -Sutures removed -Dressing applied consisting of silvadene, sterile gauze, kerlix and ACE bandage -WBAT in CAM boot -We will remove staples next visit  -Reviewed path and micro.  Path consistent with acute osteomyelitis localized however no growth noted on microbiology.  He would likely need to continue empiric therapy.  This regimen does appear to be working well for him.  No follow-ups on file.

## 2019-12-20 NOTE — Progress Notes (Signed)
  Subjective:  Patient ID: [name], male    DOB: 01/23/1970,  MRN: [id number]  Chief Complaint  Patient presents with  . Routine Post Op    DOS 11/23/2019 RT FOOT WOUND DEBRIDMENT & BONE DEBRIDEMENT, POSSSIBLE INSERSION OF ANTIBIOTIC BEADS, BONE BIOPSY. Pt states slight drainage, no n/v and or fever     DOS: 11/23/19 Procedure: Right foot saucerization of bone for osteomyelitis, insertion of abx beads, bone biopsy   49 y.o. male presents with the above complaint. History confirmed with patient.  States that is doing a lot better not having as much pain soreness is 5.5 out of 10  Objective:  Physical Exam: tenderness at the surgical site, local edema noted and calf supple, nontender. Incision: healing well, no significant drainage, no dehiscence, no significant erythema  Assessment:   1. Diabetic ulcer of other part of right foot associated with diabetes mellitus due to underlying condition, with necrosis of muscle ([place])   2. Charcot's joint of foot, right   3. Acute osteomyelitis of right ankle or foot ([place])     Plan:  Patient was evaluated and treated and all questions answered.  Post-operative State -Improving.  -Dressed with Betadine wet-to-dry.   -Cultures not available for review today.  -Continue cam boot.  No follow-ups on file.

## 2019-12-27 ENCOUNTER — Ambulatory Visit: Payer: BLUE CROSS/BLUE SHIELD | Admitting: Podiatry

## 2019-12-27 ENCOUNTER — Other Ambulatory Visit: Payer: Self-pay

## 2019-12-27 DIAGNOSIS — L97529 Non-pressure chronic ulcer of other part of left foot with unspecified severity: Secondary | ICD-10-CM | POA: Diagnosis not present

## 2019-12-27 DIAGNOSIS — E11621 Type 2 diabetes mellitus with foot ulcer: Secondary | ICD-10-CM | POA: Diagnosis not present

## 2019-12-27 DIAGNOSIS — L97513 Non-pressure chronic ulcer of other part of right foot with necrosis of muscle: Secondary | ICD-10-CM

## 2019-12-27 DIAGNOSIS — E08621 Diabetes mellitus due to underlying condition with foot ulcer: Secondary | ICD-10-CM

## 2019-12-27 NOTE — Progress Notes (Signed)
  Subjective:  Patient ID: [name], male    DOB: Jan 25, 1970,  MRN: [id number]  Chief Complaint  Patient presents with  . Callouses    Pt states left plantar callous with dark discoloration.  . Routine Post Op    POV No concerns, denies fever/nausea/vomiting/chills.    DOS: 11/23/19 Procedure: Right foot saucerization of bone for osteomyelitis, insertion of abx beads, bone biopsy   49 y.o. male presents with the above complaint. History confirmed with patient.  No issues right. Tolerating Abx without issue. New complaint of left foot great toe callus  Objective:  Physical Exam: tenderness at the surgical site, local edema noted and calf supple, nontender. Incision: healing well, no significant drainage, no dehiscence, no significant erythema Left foot HPK with dried hemorrhage, small open area, overall measuring 0.5x0.5x0.2 without warmth,erythma, SOI.  Assessment:   1. Diabetic ulcer of other part of right foot associated with diabetes mellitus due to underlying condition, with necrosis of muscle ([place])   2. Diabetic ulcer of left great toe [facility])     Plan:  Patient was evaluated and treated and all questions answered.  Post-operative State -Healing well right, no warmth, erythema, SOI. It is slightly macerated. Dressed with betadine and DSD  Left foot submet 1 ulcer -Debrided, dressed with silvadene and band-aid -I think this will resolve once he gets out of the boot and his pressure is equalized.  Procedure: Selective Debridement of Wound Rationale: Removal of devitalized tissue from the wound to promote healing.  Pre-Debridement Wound Measurements: 0.5 cm x 0.5 cm x 0.2 cm  Post-Debridement Wound Measurements: same as pre-debridement. Type of Debridement: sharp selective Tissue Removed: Devitalized soft-tissue Dressing: Dry, sterile, compression dressing. Disposition: Patient tolerated procedure well. Patient to return in 1 week for follow-up.    No  follow-ups on file.

## 2019-12-29 IMAGING — DX DG FOOT 2V*L*
2 series · 2 of 2 positions shown · non-contrast
Comparison: 12/16/2017

CLINICAL DATA: Post LEFT foot surgery

EXAM:
LEFT FOOT - 2 VIEW

[foot ap]
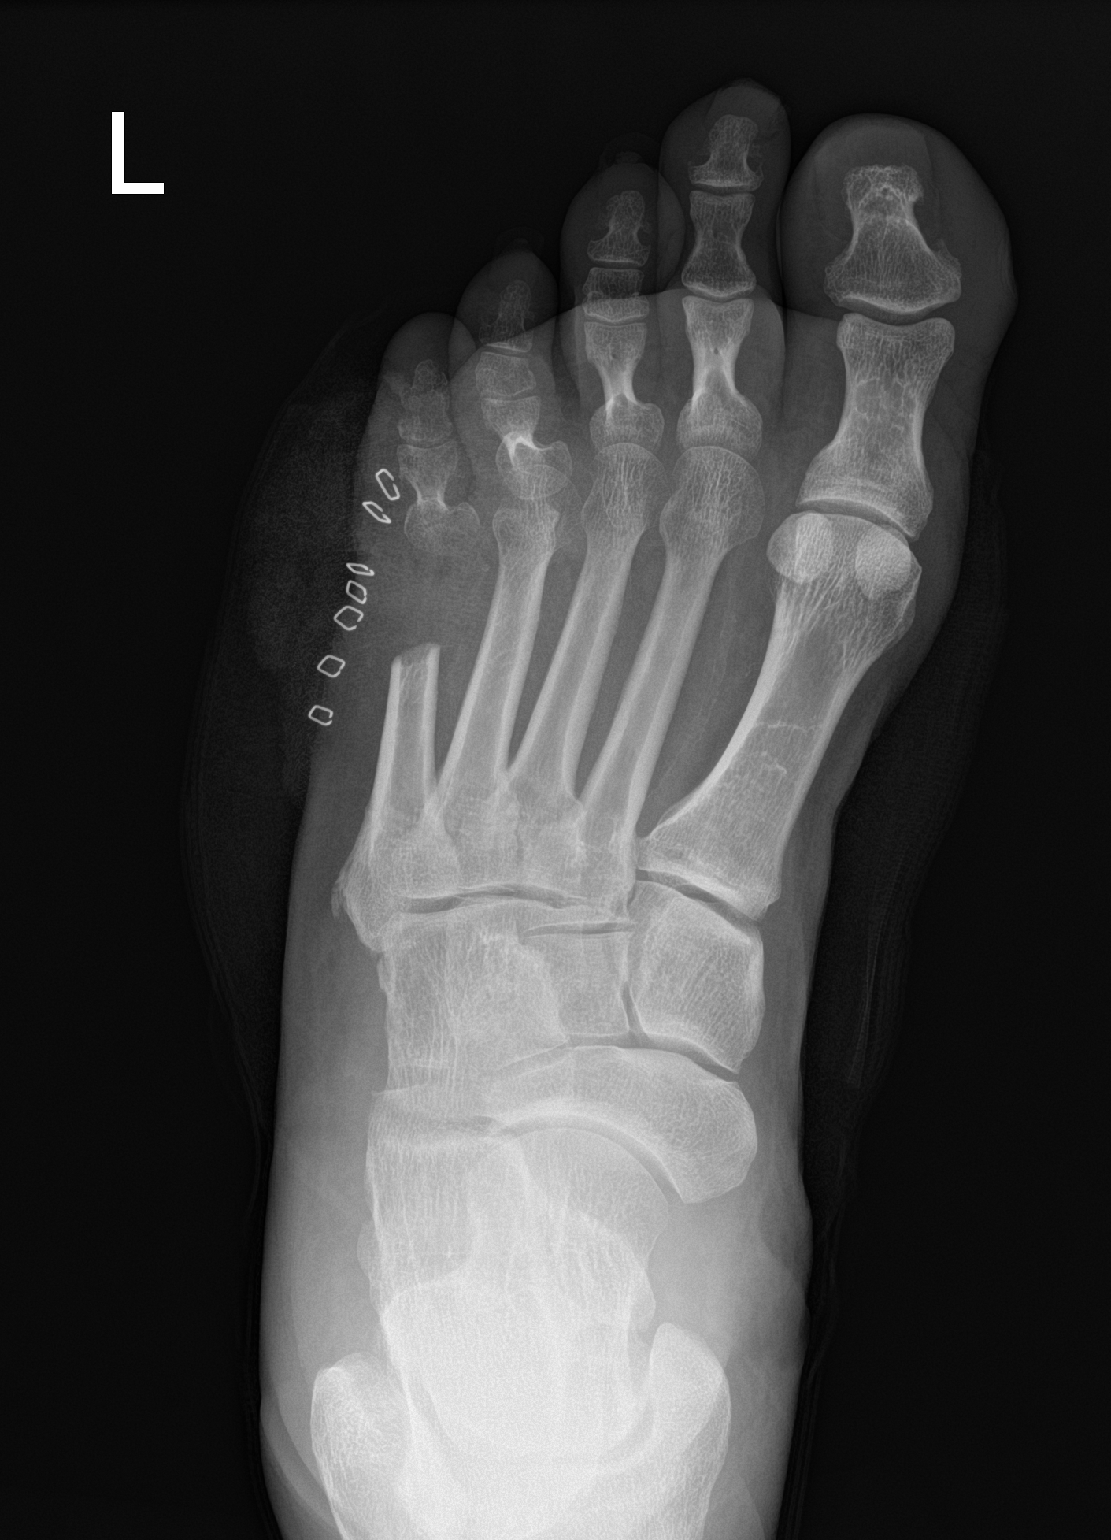

[foot lat]
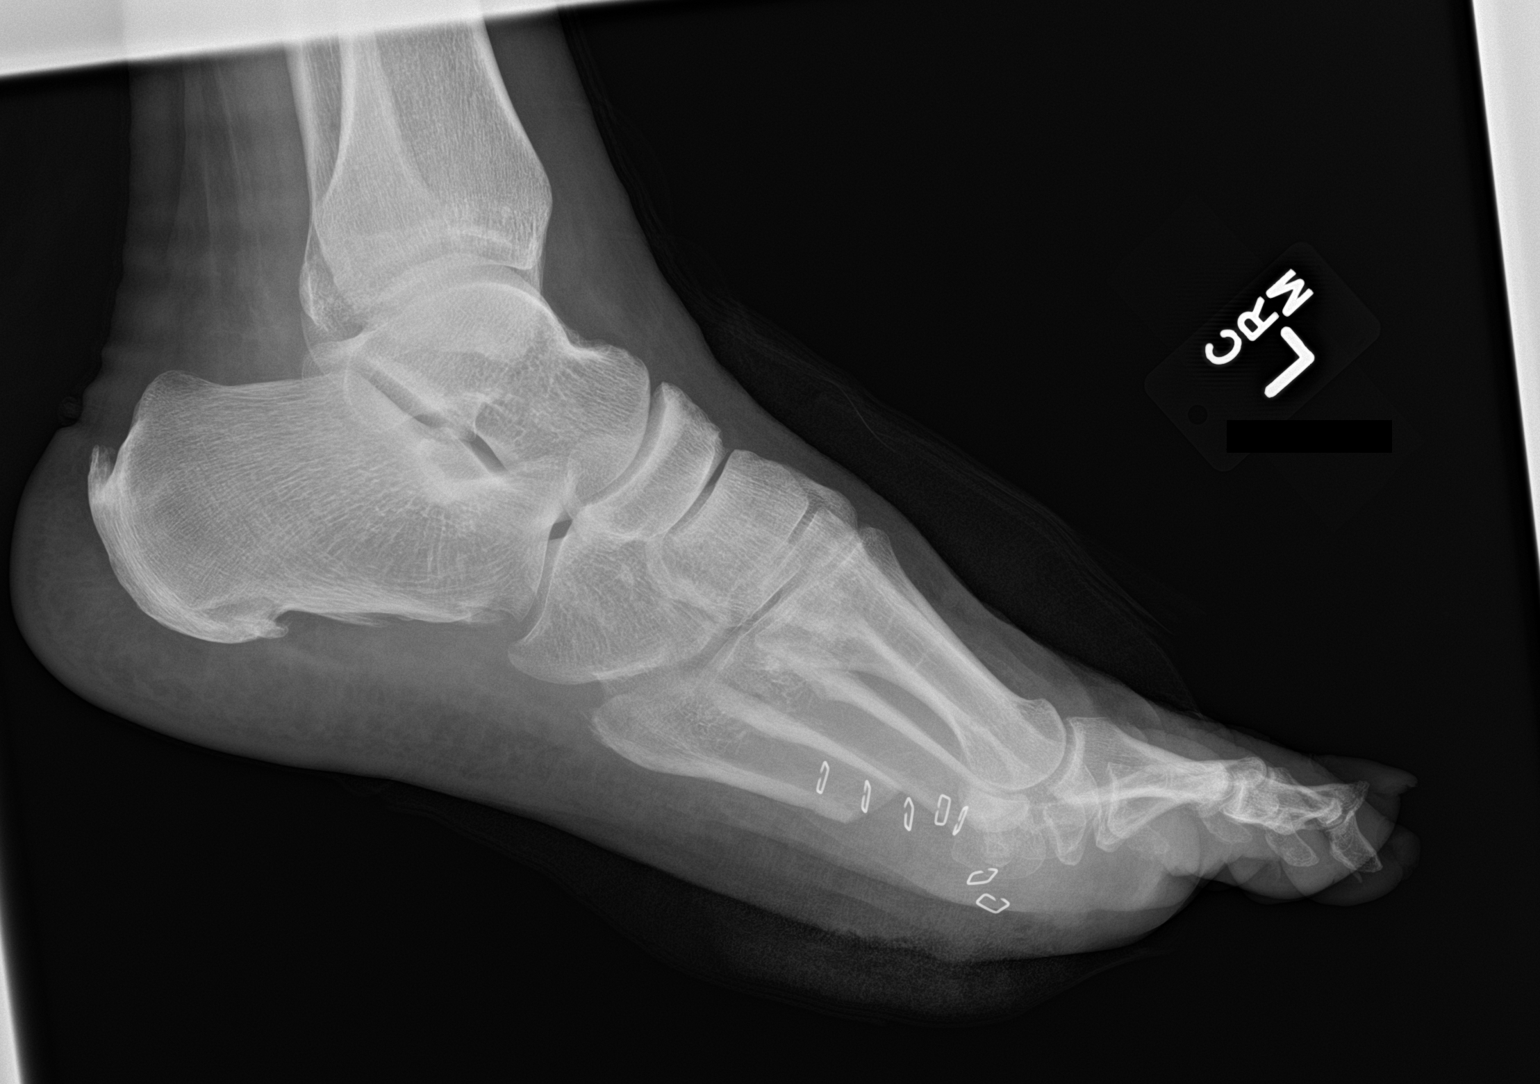

[2 of 2 positions shown; findings below may reference images not displayed]

FINDINGS: Interval amputation of the distal fifth metatarsal.

Overlying skin clips and dressings.

No acute fracture, dislocation or bone destruction.

Osseous mineralization normal.

Small plantar and Achilles insertion calcaneal spurs.
IMPRESSION: Interval resection of the distal LEFT fifth metatarsal.

## 2020-01-02 ENCOUNTER — Telehealth: Payer: Self-pay | Admitting: *Deleted

## 2020-01-02 NOTE — Telephone Encounter (Signed)
Spoke with Dr Juleen China. He would like the patient evaluated with ultrasound for clot, may need line pulled/exchanged in IR. RN left message in IR with Anderson Malta to see if it is possible to schedule patient for IR evaluation of PICC.  Updated patient and his wife.

## 2020-01-02 NOTE — Telephone Encounter (Signed)
Patient's wife called. Home health nurse is in the home for weekly labs and dressing change.  She noticed a new, nickel-sized 2 cm "looped knot" at PICC insertion point. There was small amount of dried blood on the biodisk. Nurse did not see this at last week's visit.  Patient did not notice this, has no pain. There is no pus, no current drainage, no bleeding, no redness or warmth. The PICC line is out about 1 cm, the arm circumference has increased about 1 cm. Antibiotics have been infusing well, no issues with blood return/labs at today's visit. Nurse drew CBC diff, CMP, CK, ESR, CRP today. Patient on week 3 of 4 with IV daptomycin. He just stopped lipitor 60 mg this weekend, is hoping labs will be better.  He has followed up with podiatry, is scheduled to have staples removed tomorrow at Dr Eleanora Neighbor office. Patient's wife is worried about a blood clot at the insertion site, is concerned that it may detach and cause harm. She does not want to go to the ER, would like advice this morning about what to do. She is hoping for some kind of imaging to see if there is a clot. RN advised her with reasons to call 911 - difficulty breathing/shortness of breath, chest pain, stroke-like symptoms. She and her husband both deny any symptoms as of now. Please advise. Landis Gandy, RN

## 2020-01-03 ENCOUNTER — Ambulatory Visit (HOSPITAL_COMMUNITY)
Admission: RE | Admit: 2020-01-03 | Discharge: 2020-01-03 | Disposition: A | Discharge status: Home / Self Care | Payer: BLUE CROSS/BLUE SHIELD | Source: Ambulatory Visit | Attending: Internal Medicine | Admitting: Internal Medicine

## 2020-01-03 ENCOUNTER — Ambulatory Visit (INDEPENDENT_AMBULATORY_CARE_PROVIDER_SITE_OTHER): Payer: BLUE CROSS/BLUE SHIELD | Admitting: Podiatry

## 2020-01-03 ENCOUNTER — Ambulatory Visit (HOSPITAL_COMMUNITY): Payer: BLUE CROSS/BLUE SHIELD

## 2020-01-03 ENCOUNTER — Other Ambulatory Visit (HOSPITAL_COMMUNITY): Payer: Self-pay | Admitting: Internal Medicine

## 2020-01-03 ENCOUNTER — Other Ambulatory Visit: Payer: Self-pay

## 2020-01-03 DIAGNOSIS — L97529 Non-pressure chronic ulcer of other part of left foot with unspecified severity: Secondary | ICD-10-CM

## 2020-01-03 DIAGNOSIS — M869 Osteomyelitis, unspecified: Secondary | ICD-10-CM

## 2020-01-03 DIAGNOSIS — E11621 Type 2 diabetes mellitus with foot ulcer: Secondary | ICD-10-CM

## 2020-01-03 MED ORDER — LIDOCAINE HCL 1 % IJ SOLN
INTRAMUSCULAR | Status: AC
  Filled 2020-01-03: qty 20

## 2020-01-03 MED ORDER — HEPARIN SOD (PORK) LOCK FLUSH 100 UNIT/ML IV SOLN
INTRAVENOUS | Status: AC
  Filled 2020-01-03: qty 5

## 2020-01-03 NOTE — Telephone Encounter (Signed)
Patient rearranged his schedule, was able to accept appointment at 2, will arrive at [facility] by 1:30. Notified Anderson Malta who rescheduled him for today at 2. Let patient know that he should be continuing his IV antibiotics.

## 2020-01-03 NOTE — Telephone Encounter (Signed)
Thank you :)

## 2020-01-03 NOTE — Telephone Encounter (Signed)
Patient's wife, Lelan Pons, called wanting to know if IR appointment has been scheduled. RN informed her that our office has contacted IR and we are waiting on a call back from them. Othelia Pulling we would let her know as soon as we know anything.   Beryle Flock, RN

## 2020-01-03 NOTE — Progress Notes (Signed)
49 y.o. male outpatient. History of osteomyelitis requiring 6 weeks of IV antiobiotics. IR placed a single lumen PICC on 11.22.21 Patient seen in Interventional Radiology for a "knot" by the PICC line exit site. PICC line unremarkable.  No tenderness, erythema or drainage noted. Catheter is able to infuse and draw back. Palpation of the area in question reveals the PICC line not looped secondary to the Patient's superficial access site and gravity. PICC line is okay to use.  Discussed care of with patient. All questions were answered and concerns were addressed. Patient agrees with plan.

## 2020-01-03 NOTE — Telephone Encounter (Signed)
Spoke with [employer] in [facility]. They can fit him into the schedule today at 2 (arrive 1:30) for evaluation and replacement if necessary. RN notified patient, he said he has a meeting at 2, cannot go today. RN left message for Anderson Malta asking if this could be rescheduled, will follow. Patient's wife is asking if his IV antibiotics can be switched to oral, just have the PICC pulled. Labs available via Care Everywhere ([place] bringing his labs to [facility] for processing per [facility]).  Please advise if 1) if ok to push out PICC evaluation/replacement another day or so depending on IR and patient's schedule, and 2) if appropriate to switch to oral meds at patient's request. Landis Gandy, RN

## 2020-01-03 NOTE — Telephone Encounter (Signed)
Would prefer to continue with IV antibiotics b/c of his osteomyelitis and attempting to salvage foot.  I would be okay with PICC eval/replacement pushed another day or so if able to be coordinated with IR and patients schedule.

## 2020-01-04 ENCOUNTER — Ambulatory Visit (HOSPITAL_COMMUNITY): Payer: BLUE CROSS/BLUE SHIELD

## 2020-01-05 NOTE — Progress Notes (Signed)
  Subjective:  Patient ID: [name], male    DOB: 04-27-1970,  MRN: [id number]  Chief Complaint  Patient presents with  . Routine Post Op    POV Denies any new concerns, denies fever/chills/nausea/vomiting.    DOS: 11/23/19 Procedure: Right foot saucerization of bone for osteomyelitis, insertion of abx beads, bone biopsy   49 y.o. male presents with the above complaint. History confirmed with patient.  States both wounds are doing well denies new concerns or issues  Objective:  Physical Exam: tenderness at the surgical site, local edema noted and calf supple, nontender. Incision: healing well, no significant drainage, no dehiscence, no significant erythema Left foot HPK with dried hemorrhage, small open area, overall measuring 0.3 x 0.3 without warmth,erythma, SOI.  Assessment:   1. Diabetic ulcer of left great toe [facility])     Plan:  Patient was evaluated and treated and all questions answered.  Post-operative State -Healing well, still slightly [place]er.  Staples removed applied povidone ointment and dressing.  Patient to apply dressing removing Band-Aid to avoid maceration. -Continue boot immobilization we will plan to transition to surgical shoe in a couple weeks. Left foot submet 1 ulcer -Debrided improved compared to prior.    Procedure: Selective Debridement of Wound Rationale: Removal of devitalized tissue from the wound to promote healing.  Pre-Debridement Wound Measurements: 0.3 cm x 0.3 cm x 0.1 cm  Post-Debridement Wound Measurements: same as pre-debridement. Type of Debridement: sharp selective Tissue Removed: Devitalized soft-tissue Dressing: Dry, sterile, compression dressing. Disposition: Patient tolerated procedure well. Patient to return in 1 week for follow-up.    Return in about 2 weeks (around 01/17/2020) for Wound Care.

## 2020-01-09 ENCOUNTER — Telehealth: Payer: Self-pay

## 2020-01-09 NOTE — Telephone Encounter (Signed)
Patient's wife called stating that home health is unable to get any blood return from the the PICC. Home health nurse says that line flushes fine and medication infuses without issue. Patient's PICC was evaluated by IR last week. RN advised home health nurse to get a peripheral stick for labs on the other arm. Advised patient's wife that we would evaluate PICC in office at his appointment with Dr. Juleen China tomorrow 12/21. Patient verbalized understanding and has no further questions.    Beryle Flock, RN

## 2020-01-10 ENCOUNTER — Other Ambulatory Visit: Payer: Self-pay

## 2020-01-10 ENCOUNTER — Telehealth: Payer: Self-pay

## 2020-01-10 ENCOUNTER — Encounter: Payer: Self-pay | Admitting: Internal Medicine

## 2020-01-10 ENCOUNTER — Ambulatory Visit (INDEPENDENT_AMBULATORY_CARE_PROVIDER_SITE_OTHER): Payer: BLUE CROSS/BLUE SHIELD | Admitting: Internal Medicine

## 2020-01-10 DIAGNOSIS — T82898A Other specified complication of vascular prosthetic devices, implants and grafts, initial encounter: Secondary | ICD-10-CM | POA: Diagnosis not present

## 2020-01-10 DIAGNOSIS — R748 Abnormal levels of other serum enzymes: Secondary | ICD-10-CM

## 2020-01-10 DIAGNOSIS — M86171 Other acute osteomyelitis, right ankle and foot: Secondary | ICD-10-CM

## 2020-01-10 NOTE — Telephone Encounter (Signed)
Per MD called IR to schedule appointment for picc evaluation. Patient reports  Home health is not able to draw labs from picc,but is able to infuse antbx and flush.  Left voicemail with IR to call office with an appointment.

## 2020-01-10 NOTE — Assessment & Plan Note (Addendum)
Pt s/p right foot saucerization of bone for OM with bone bx with Dr March Rummage 11/23/19.   Cultures negative but pathology confirmed osteo.  Treated with Levaquin then transitioned to Daptomycin, Ceftriaxone after PICC placed 12/12/19.  Planned 6 weeks of IV therapy with end date 01/24/20.    Labs 12/6 ESR 20, CRP 8.4, CK 478.  12/13 ESR 29, CRP 20.5.  Pre-treatment ESR 28, CRP 7.1.    Was having issues with PICC last week and evaluated by IR where no issues were found.  Then called clinic yesterday due to [facility] RN unable to get any blood return from the PICC but line was flushing and meds infusing without issues.  HH RN thinks that his line needs to be removed/exchanged.  He denies any pain or drainage around PICC site.  Had labs drawn yesterday peripherally.  Otherwise doing well on his antibiotics.  CK has been mildly elevated without myalgias. No fevers, chills, N/V.  Initially had diarrhea but that's resolved.  Most recently seen back by Dr March Rummage on 01/03/20.  Wound was felt to be healing well overall on right foot.  Also has an ulcer on left that was debrided in clinic.   PLAN: -- continue daptomycin and ceftriaxone -- weekly OPAT labs -- will try to arrange for another PICC eval with IR and replace if needed -- RTC in 2 weeks

## 2020-01-10 NOTE — Assessment & Plan Note (Signed)
Having issues with blood return from PICC line.  [facility] RN feels that further IR eval and possible placement needed.  PLAN: -- will try to get him in to IR for further evaluation

## 2020-01-10 NOTE — Progress Notes (Signed)
[facility] for Infectious Disease  CHIEF COMPLAINT:    Follow up for osteomyelitis  SUBJECTIVE:    [name] is a 49 y.o. male with PMHx as below who presents to the clinic for follow up of osteomyelitis.   Please see A&P for the details of today's visit and status of the patient's medical problems.   Patient's Medications  New Prescriptions   No medications on file  Previous Medications   ASCORBIC ACID (VITAMIN C) 500 MG TABLET    Take by mouth daily.    ASPIRIN EC 81 MG TABLET    Take 81 mg by mouth daily.   ATORVASTATIN (LIPITOR) 40 MG TABLET    TAKE 1 AND 1/2 TABLETS BY MOUTH EVERY MORNING   CYANOCOBALAMIN (VITAMIN B-12) 5000 MCG SUBL    Place 5,000 mcg under the tongue daily.   FENOFIBRATE (TRICOR) 48 MG TABLET    TAKE 1 TABLET(48 MG) BY MOUTH DAILY   FERROUS SULFATE 325 (65 FE) MG TABLET    Take 1 tablet (325 mg total) by mouth 2 (two) times daily with a meal.   GLUCOSE BLOOD (ACCU-CHEK GUIDE) TEST STRIP    Dx E11.49 - Use to check blood glucose three times daily or as directed.   HYDROCHLOROTHIAZIDE (MICROZIDE) 12.5 MG CAPSULE    TAKE 1 CAPSULE(12.5 MG) BY MOUTH DAILY IN THE MORNING   HYDROCODONE-ACETAMINOPHEN (NORCO) 5-325 MG TABLET    Take 1 tablet by mouth every 6 (six) hours as needed for moderate pain.   INSULIN ASPART (NOVOLOG) 100 UNIT/ML INJECTION    INJECT 15 TO 25 UNITS UNDER THE SKIN TWICE DAILY   INSULIN SYRINGE-NEEDLE U-100 (BD INSULIN SYRINGE U/F) 31G X 5/16" 0.5 ML MISC    Use to inject insulin 4 times daily   INSULIN SYRINGE-NEEDLE U-100 (BD VEO INSULIN SYR U/F 1/2UNIT) 31G X 15/64" 0.3 ML MISC    USE TO INJECT INSULIN FOUR TIMES DAILY AS DIRECTED   LANCETS MISC    USED TO CHECK BLOOD GLUCOSE 3-4 TIMES DAILY   LEVOFLOXACIN (LEVAQUIN) 500 MG TABLET    TAKE 1 TABLET(500 MG) BY MOUTH DAILY   MULTIPLE VITAMIN (MULTIVITAMIN WITH MINERALS) TABS TABLET    Take 1 tablet by mouth 2 (two) times daily.   OMEGA-3 FATTY ACIDS (FISH OIL) 1360 MG  CAPS    Take 1,360 mg by mouth daily.    PYRIDOXINE (B-6) 100 MG TABLET    Take 100 mg by mouth 2 (two) times daily.   RAMIPRIL (ALTACE) 5 MG CAPSULE    TAKE 1 CAPSULE(5 MG) BY MOUTH EVERY MORNING   SILVER SULFADIAZINE (SILVADENE) 1 % CREAM    Apply pea-sized amount to wound daily.   SITAGLIPTIN (JANUVIA) 50 MG TABLET    Take 1 tablet (50 mg total) by mouth every morning.   TRESIBA 100 UNIT/ML SOLN    ADMINISTER 64 UNITS UNDER THE SKIN AT BEDTIME  Modified Medications   No medications on file  Discontinued Medications   No medications on file     Past Medical History:  Diagnosis Date  . Cellulitis and abscess of foot    05/ 2021 left  . CKD (chronic kidney disease), stage III ([place]) 06-17-2019 per pt last visit 05-29-2016 (note in care everywhere)  currently followed by pcp   nephrologist-  dr [name] ([facility] in [place])    . Diabetic peripheral neuropathy ([place])   . Elevated LFTs    followed by  pcp  . Hypertension    followed by pcp---- (06-17-2019 pt stated never had a stress test)  . Iron deficiency anemia   . Mixed hyperlipidemia   . 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 Type 2 diabetes mellitus treated with insulin ([place])    followed by pcp   (06-17-2019  pt stated checks blood sugar daily in am,  fasting sugar-- average 71)  . Vitamin B 12 deficiency     Family History  Problem Relation Age of Onset  . Stroke Father        early 40s. former smoker  . Diabetes Father   . Hypertension Father   . Sarcoidosis Mother   . Hypertension Sister   . Healthy Daughter   . Diabetes Paternal Grandmother    . Congenital heart disease Paternal Grandmother        died of complications    Social History   Socioeconomic History  . Marital status: Married    Spouse name: Not on file  . Number of children: Not on file  . Years of education: Not on file  . Highest education level: Not on file  Occupational History  . Not on file  Tobacco Use  . Smoking status: Never Smoker  . Smokeless tobacco: Never Used  Vaping Use  . Vaping Use: Never used  Substance and Sexual Activity  . Alcohol use: No  . Drug use: Never  . Sexual activity: Yes  Other Topics Concern  . Not on file  Social History Narrative   Married. 73 year old daughter 10/2016.       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       Hobbies: drawing, archery, tinkering   Social Determinants of Health   Financial Resource Strain: Not on file  Food Insecurity: Not on file  Transportation Needs: Not on file  Physical Activity: Not on file  Stress: Not on file  Social Connections: Not on file  Intimate Partner Violence: Not on file    No Known Allergies  Review of Systems  Constitutional: Negative for chills and fever.  Respiratory: Negative.   Cardiovascular: Negative.   Gastrointestinal: Negative for diarrhea, nausea and vomiting.     OBJECTIVE:    Vitals:   01/10/20 0849  BP: (!) 157/93  Pulse: 73  Temp: 98.3 F (36.8 C)  TempSrc: Oral  Weight: (!) 322 lb (146.1 kg)   Body mass index is 43.67 kg/m.  Physical Exam Constitutional:      General: He is not in acute distress.    Appearance: Normal appearance.  Musculoskeletal:     Comments: Right PICC line without warmth, erythema, tenderness.   Skin:    General: Skin is warm and dry.     Findings: No rash.  Neurological:     Mental Status: He is alert.         Pertinent Labs and Microbiology CBC Latest Ref Rng & Units 12/09/2019 11/09/2019 06/14/2019  WBC  3.8 - 10.8 Thousand/uL 9.8 10.0 12.0(H)  Hemoglobin 13.2 - 17.1 g/dL 13.1(L) 13.2 11.7(L)  Hematocrit 38.5 - 50.0 % 39.8 40.1 35.9(L)  Platelets 140 - 400 Thousand/uL 350 321 390.0   CMP Latest Ref Rng & Units 12/09/2019 11/09/2019 06/14/2019  Glucose 65 - 99 mg/dL 105(H) 137(H) 149(H)  BUN 7 - 25 mg/dL 24 25 27(H)  Creatinine 0.60 - 1.35 mg/dL 1.39(H) 1.48(H) 1.74(H)  Sodium 135 - 146 mmol/L 138 139 136  Potassium 3.5 - 5.3 mmol/L 4.0 3.8 4.7  Chloride 98 - 110 mmol/L 102 105 102  CO2 20 - 32 mmol/L _0 Calcium 8.6 - 10.3 mg/dL 9.8 10.0 9.7  Total Protein 6.1 - 8.1 g/dL 8.0 7.6 7.7  Total Bilirubin 0.2 - 1.2 mg/dL 0.5 1.0 0.5  Alkaline Phos 39 - 117 U/L - - 97  AST 10 - 40 U/L 34 27 26  ALT 9 - 46 U/L 50(H) 31 35      ASSESSMENT & PLAN:    Osteomyelitis of ankle or foot, acute, right (HCC) Pt s/p right foot saucerization of bone for OM with bone bx with Dr March Rummage 11/23/19.   Cultures negative but pathology confirmed osteo.  Treated with Levaquin then transitioned to Daptomycin, Ceftriaxone after PICC placed 12/12/19.  Planned 6 weeks of IV therapy with end date 01/24/20.    Labs 12/6 ESR 20, CRP 8.4, CK 478.  12/13 ESR 29, CRP 20.5.  Pre-treatment ESR 28, CRP 7.1.    Was having issues with PICC last week and evaluated by IR where no issues were found.  Then called clinic yesterday due to [facility] RN unable to get any blood return from the PICC but line was flushing and meds infusing without issues.  HH RN thinks that his line needs to be removed/exchanged.  He denies any pain or drainage around PICC site.  Had labs drawn yesterday peripherally.  Otherwise doing well on his antibiotics.  CK has been mildly elevated without myalgias. No fevers, chills, N/V.  Initially had diarrhea but that's resolved.  Most recently seen back by Dr March Rummage on 01/03/20.  Wound was felt to be healing well overall on right foot.  Also has an ulcer on left that was debrided in clinic.   PLAN: -- continue  daptomycin and ceftriaxone -- weekly OPAT labs -- will try to arrange for another PICC eval with IR and replace if needed -- RTC in 2 weeks  Occlusion of peripherally inserted central catheter (PICC) line ([place]) Having issues with blood return from PICC line.  [facility] RN feels that further IR eval and possible placement needed.  PLAN: -- will try to get him in to IR for further evaluation   Orders Placed This Encounter  Procedures  . IR Radiologist Eval & Mgmt    Pt with PICC line for IV abx.  Had IR eval last week and no issues found.  Yesterday, home health RN was unable to draw blood from line, but meds were infusing okay.  RN feels that he needs PICC evaluated again.  Thanks.    Order Specific Question:   Reason for Exam (SYMPTOM  OR DIAGNOSIS REQUIRED)    Answer:   Osteomyelitis    Order Specific Question:   Preferred Imaging Location?    Answer:   [place] [facility] 01/10/2020, 9:11 AM  I spent 30 minutes dedicated to the care of this patient on the date of this encounter to include pre-visit review of records, face-to-face time  with the patient discussing osteomyelitis of right foot and occluded PICC line, and post-visit ordering of testing.

## 2020-01-10 NOTE — Patient Instructions (Signed)
Thank you for coming to see me today. It was a pleasure seeing you.  We are trying to arrange for an IR evaluation of your PICC line again to see if it needs replacement.  In the meantime, please continue with your current antibiotics.  If you have any questions or concerns, please do not hesitate to call the office at [phone].  Take Care,   Jule Ser, DO

## 2020-01-11 NOTE — Telephone Encounter (Signed)
Spoke with IR and scheduled picc evaluation for tomorrow at [facility].  Patient agreed to appointment.

## 2020-01-11 NOTE — Telephone Encounter (Signed)
Left voicemail with IR to schedule picc evaluation as soon as possible.

## 2020-01-12 ENCOUNTER — Other Ambulatory Visit: Payer: Self-pay

## 2020-01-12 ENCOUNTER — Ambulatory Visit (HOSPITAL_COMMUNITY)
Admission: RE | Admit: 2020-01-12 | Discharge: 2020-01-12 | Disposition: A | Discharge status: Home / Self Care | Payer: BLUE CROSS/BLUE SHIELD | Source: Ambulatory Visit | Attending: Internal Medicine | Admitting: Internal Medicine

## 2020-01-12 ENCOUNTER — Encounter (HOSPITAL_COMMUNITY): Payer: Self-pay | Admitting: Radiology

## 2020-01-12 HISTORY — PX: IR PATIENT EVAL TECH 0-60 MINS: IMG5564

## 2020-01-12 NOTE — Procedures (Signed)
Patient came to IR for PICC evaluation due to not being able to draw back blood.  Dressing was loosened and a kink was noted outside the insertion site due to the acute angle of the dressing.  Line aspirated and flushed without difficulty. New dressing applied without the abrupt angle of the line.

## 2020-01-12 NOTE — Progress Notes (Signed)
IR.  History of osteomyelitis on long term IV antibiotics for management via RUE PICC placed in IR 12/12/2019.  Patient presents today for PICC evaluation- per notes home health RN unable to flush/aspirate PICC line. Patient seen by this PA alongside [place], RT today in IR. PICC has obvious kink on external portion of PICC (it was taped this way)- tape was removed to un-kink PICC which was easily flushed/aspirated without difficulty (this was done with and without tube extension without issues). No tenderness, erythema, or drainage noted.   PICC line ready for use. Discussed care with patient. All questions answered and concerns addressed. Patient conveys understanding and agrees with plan. Please call IR with questions/concerns.   Bea Graff Tiare Rohlman, PA-C 01/12/2020, 9:22 AM

## 2020-01-17 ENCOUNTER — Ambulatory Visit: Payer: BLUE CROSS/BLUE SHIELD | Admitting: Orthotics

## 2020-01-17 ENCOUNTER — Ambulatory Visit (INDEPENDENT_AMBULATORY_CARE_PROVIDER_SITE_OTHER): Payer: BLUE CROSS/BLUE SHIELD | Admitting: Podiatry

## 2020-01-17 ENCOUNTER — Ambulatory Visit (INDEPENDENT_AMBULATORY_CARE_PROVIDER_SITE_OTHER): Payer: BLUE CROSS/BLUE SHIELD

## 2020-01-17 ENCOUNTER — Other Ambulatory Visit: Payer: Self-pay

## 2020-01-17 DIAGNOSIS — E08621 Diabetes mellitus due to underlying condition with foot ulcer: Secondary | ICD-10-CM | POA: Diagnosis not present

## 2020-01-17 DIAGNOSIS — L97513 Non-pressure chronic ulcer of other part of right foot with necrosis of muscle: Secondary | ICD-10-CM

## 2020-01-17 DIAGNOSIS — M14671 Charcot's joint, right ankle and foot: Secondary | ICD-10-CM

## 2020-01-17 DIAGNOSIS — L03115 Cellulitis of right lower limb: Secondary | ICD-10-CM

## 2020-01-17 NOTE — Progress Notes (Signed)
Active ucler; could not cast for f/o

## 2020-01-17 NOTE — Progress Notes (Signed)
  Subjective:  Patient ID: [name], male    DOB: 1970/10/16,  MRN: [id number]  Chief Complaint  Patient presents with  . Routine Post Op    Right foot diabetic ulcer surgical ulcer follow-up. No concerns voiced by patient. Patient denies N/V, fever, chills, and pain.     DOS: 11/23/19 Procedure: Right foot saucerization of bone for osteomyelitis, insertion of abx beads, bone biopsy   49 y.o. male presents with the above complaint. History confirmed with patient.  Thinks the foot is more swollen than it has been recently. Still on antibiotics.  Objective:  Physical Exam: tenderness at the surgical site, local edema noted and calf supple, nontender. Incision: healing well, no significant drainage, no dehiscence, no significant erythema Left foot HPK with dried hemorrhage, small open area, overall measuring 0.3 x 0.3 without warmth,erythma, SOI.  Assessment:   1. Diabetic ulcer of other part of right foot associated with diabetes mellitus due to underlying condition, with necrosis of muscle (HCC)   2. Charcot's joint of foot, right   3. Cellulitis of foot, right     Plan:  Patient was evaluated and treated and all questions answered.  Post-operative State -Wound with slight open area.  This does probe deep but not to the bone. -He has continued edema.  X-rays taken do not show further erosive changes however there does appear to be worsened Charcot changes to the midfoot.  We discussed that this could be the reason for the edema.  He is currently on antibiotics and the wound does not show any other signs of infection.  I do not think any other antibiotics are indicated at this time.  Continue to dress the wound daily with Betadine wet-to-dry.  Dressed as/today.  Procedure: Excisional Debridement of Wound Indication: Removal of non-viable soft tissue from the wound to promote healing.  Anesthesia: none Pre-Debridement Wound Measurements: overlying HPK Post-Debridement  Wound Measurements: 0.5x0.5 Type of Debridement: Sharp Excisional Tissue Removed: Non-viable soft tissue Instrumentation: 15 blade and tissue nipper Depth of Debridement: subcutaneous tissue. Technique: Sharp excisional debridement to bleeding, viable wound base.  Dressing: Dry, sterile, compression dressing. Disposition: Patient tolerated procedure well. Patient to return in 1 week for follow-up.     No follow-ups on file.

## 2020-01-23 ENCOUNTER — Telehealth: Payer: Self-pay | Admitting: *Deleted

## 2020-01-23 NOTE — Telephone Encounter (Signed)
IV dapto and rocephin end 1/4, patient's follow up in clinic is 1/7. Please advise if ok to pull PICC, extend IV antibiotics, or maintain PICC until seen 1/7. Andree Coss, RN

## 2020-01-23 NOTE — Telephone Encounter (Signed)
Since his appt with me is so close to his end of therapy date, I am okay keeping PICC until then so we can assess in person his wound.  Thanks!

## 2020-01-23 NOTE — Telephone Encounter (Signed)
Relayed verbal order to [facility]. Thanks!

## 2020-01-27 ENCOUNTER — Ambulatory Visit (INDEPENDENT_AMBULATORY_CARE_PROVIDER_SITE_OTHER): Payer: BLUE CROSS/BLUE SHIELD | Admitting: Internal Medicine

## 2020-01-27 ENCOUNTER — Encounter: Payer: Self-pay | Admitting: Internal Medicine

## 2020-01-27 ENCOUNTER — Other Ambulatory Visit: Payer: Self-pay

## 2020-01-27 DIAGNOSIS — M86171 Other acute osteomyelitis, right ankle and foot: Secondary | ICD-10-CM | POA: Diagnosis not present

## 2020-01-27 NOTE — Patient Instructions (Signed)
Thank you for coming to see me today. It was a pleasure seeing you.  You have completed 6 weeks of antibiotics for your foot infection.  Please continue to see Dr March Rummage and keep an eye out for worsening after stopping antibiotics (fevers, chills, increased drainage)  If you have any questions or concerns, please do not hesitate to call the office at [phone].  Take Care,   Jule Ser, DO

## 2020-01-27 NOTE — Progress Notes (Signed)
Per verbal order from Dr Juleen China, 40 cm Single Lumen Peripherally Inserted Central Catheter removed from right basilic, tip intact. No sutures present. RN confirmed length per chart. Roughly 10cm was present outside of the insertion site. Dressing was clean and dry. Petroleum dressing applied. Pt advised no heavy lifting with this arm, leave dressing for 24 hours and call the office or seek emergent care if dressing becomes soaked with blood or swelling or sharp pain presents. Patient verbalized understanding and agreement.  Patient's questions answered to their satisfaction. Patient tolerated procedure well, RN walked patient to check out. Pharmacy notified  Carlean Purl, RN

## 2020-01-27 NOTE — Progress Notes (Signed)
[facility]  CHIEF COMPLAINT:    Follow up osteomyelitis  SUBJECTIVE:    [name] is a 50 y.o. male with PMHx as below who presents to the clinic for follow up of osteomyelitis.   Please see A&P for the details of today's visit and status of the patient's medical problems.   Patient's Medications  New Prescriptions   No medications on file  Previous Medications   ASCORBIC ACID (VITAMIN C) 500 MG TABLET    Take by mouth daily.    ASPIRIN EC 81 MG TABLET    Take 81 mg by mouth daily.   ATORVASTATIN (LIPITOR) 40 MG TABLET    TAKE 1 AND 1/2 TABLETS BY MOUTH EVERY MORNING   CYANOCOBALAMIN (VITAMIN B-12) 5000 MCG SUBL    Place 5,000 mcg under the tongue daily.   FENOFIBRATE (TRICOR) 48 MG TABLET    TAKE 1 TABLET(48 MG) BY MOUTH DAILY   FERROUS SULFATE 325 (65 FE) MG TABLET    Take 1 tablet (325 mg total) by mouth 2 (two) times daily with a meal.   GLUCOSE BLOOD (ACCU-CHEK GUIDE) TEST STRIP    Dx E11.49 - Use to check blood glucose three times daily or as directed.   HYDROCHLOROTHIAZIDE (MICROZIDE) 12.5 MG CAPSULE    TAKE 1 CAPSULE(12.5 MG) BY MOUTH DAILY IN THE MORNING   HYDROCODONE-ACETAMINOPHEN (NORCO) 5-325 MG TABLET    Take 1 tablet by mouth every 6 (six) hours as needed for moderate pain.   INSULIN ASPART (NOVOLOG) 100 UNIT/ML INJECTION    INJECT 15 TO 25 UNITS UNDER THE SKIN TWICE DAILY   INSULIN SYRINGE-NEEDLE U-100 (BD INSULIN SYRINGE U/F) 31G X 5/16" 0.5 ML MISC    Use to inject insulin 4 times daily   INSULIN SYRINGE-NEEDLE U-100 (BD VEO INSULIN SYR U/F 1/2UNIT) 31G X 15/64" 0.3 ML MISC    USE TO INJECT INSULIN FOUR TIMES DAILY AS DIRECTED   LANCETS MISC    USED TO CHECK BLOOD GLUCOSE 3-4 TIMES DAILY   LEVOFLOXACIN (LEVAQUIN) 500 MG TABLET    TAKE 1 TABLET(500 MG) BY MOUTH DAILY   MULTIPLE VITAMIN (MULTIVITAMIN WITH MINERALS) TABS TABLET    Take 1 tablet by mouth 2 (two) times daily.   OMEGA-3 FATTY ACIDS (FISH OIL) 1360 MG CAPS     Take 1,360 mg by mouth daily.    PYRIDOXINE (B-6) 100 MG TABLET    Take 100 mg by mouth 2 (two) times daily.   RAMIPRIL (ALTACE) 5 MG CAPSULE    TAKE 1 CAPSULE(5 MG) BY MOUTH EVERY MORNING   SILVER SULFADIAZINE (SILVADENE) 1 % CREAM    Apply pea-sized amount to wound daily.   SITAGLIPTIN (JANUVIA) 50 MG TABLET    Take 1 tablet (50 mg total) by mouth every morning.   TRESIBA 100 UNIT/ML SOLN    ADMINISTER 64 UNITS UNDER THE SKIN AT BEDTIME  Modified Medications   No medications on file  Discontinued Medications   No medications on file     Past Medical History:  Diagnosis Date  . Cellulitis and abscess of foot    05/ 2021 left  . CKD (chronic kidney disease), stage III ([place]) 06-17-2019 per pt last visit 05-29-2016 (note in care everywhere)  currently followed by pcp   nephrologist-  dr [name] ([facility] in [place])    . Diabetic peripheral neuropathy ([place])   . Elevated LFTs    followed by pcp  .  Hypertension    followed by pcp---- (06-17-2019 pt stated never had a stress test)  . Iron deficiency anemia   . Mixed hyperlipidemia   . 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 Type 2 diabetes mellitus treated with insulin ([place])    followed by pcp   (06-17-2019  pt stated checks blood sugar daily in am,  fasting sugar-- average 71)  . Vitamin B 12 deficiency     Family History  Problem Relation Age of Onset  . Stroke Father        early 64s. former smoker  . Diabetes Father   . Hypertension Father   . Sarcoidosis Mother   . Hypertension Sister   . Healthy Daughter   . Diabetes Paternal Grandmother   .  Congenital heart disease Paternal Grandmother        died of complications    Social History   Socioeconomic History  . Marital status: Married    Spouse name: Not on file  . Number of children: Not on file  . Years of education: Not on file  . Highest education level: Not on file  Occupational History  . Not on file  Tobacco Use  . Smoking status: Never Smoker  . Smokeless tobacco: Never Used  Vaping Use  . Vaping Use: Never used  Substance and Sexual Activity  . Alcohol use: No  . Drug use: Never  . Sexual activity: Yes  Other Topics Concern  . Not on file  Social History Narrative   Married. 7 year old daughter 10/2016.       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       Hobbies: drawing, archery, tinkering   Social Determinants of Health   Financial Resource Strain: Not on file  Food Insecurity: Not on file  Transportation Needs: Not on file  Physical Activity: Not on file  Stress: Not on file  Social Connections: Not on file  Intimate Partner Violence: Not on file    No Known Allergies  Review of Systems  Constitutional: Negative for chills and fever.  Gastrointestinal: Negative.   Musculoskeletal:       Foot wound healing.   Skin: Negative for rash.     OBJECTIVE:    Vitals:   01/27/20 1346  BP: (!) 161/89  Pulse: 78  Temp: 98.2 F (36.8 C)  TempSrc: Oral  Weight: (!) 324 lb 6.4 oz (147.1 kg)   Body mass index is 44 kg/m.  Physical Exam Constitutional:      General: He is not in acute distress.    Appearance: Normal appearance.  HENT:     Head: Normocephalic and atraumatic.  Pulmonary:     Effort: Pulmonary effort is normal. No respiratory distress.  Skin:    General: Skin is warm and dry.     Comments: + Right UE PICC in place.   Neurological:     Mental Status: He is alert.         Pertinent Labs and Microbiology CBC Latest Ref Rng &  Units 12/09/2019 11/09/2019 06/14/2019  WBC 3.8 - 10.8 Thousand/uL 9.8 10.0 12.0(H)  Hemoglobin 13.2 - 17.1 g/dL 13.1(L) 13.2 11.7(L)  Hematocrit 38.5 - 50.0 % 39.8 40.1 35.9(L)  Platelets 140 - 400 Thousand/uL 350 321 390.0   CMP Latest Ref Rng & Units 12/09/2019 11/09/2019 06/14/2019  Glucose 65 - 99 mg/dL 105(H) 137(H) 149(H)  BUN 7 - 25 mg/dL 24 25 27(H)  Creatinine 0.60 - 1.35 mg/dL 1.39(H) 1.48(H) 1.74(H)  Sodium 135 - 146 mmol/L 138 139 136  Potassium 3.5 - 5.3 mmol/L 4.0 3.8 4.7  Chloride 98 - 110 mmol/L 102 105 102  CO2 20 - 32 mmol/L 28 28 29   Calcium 8.6 - 10.3 mg/dL 9.8 10.0 9.7  Total Protein 6.1 - 8.1 g/dL 8.0 7.6 7.7  Total Bilirubin 0.2 - 1.2 mg/dL 0.5 1.0 0.5  Alkaline Phos 39 - 117 U/L - - 97  AST 10 - 40 U/L 34 27 26  ALT 9 - 46 U/L 50(H) 31 35      ASSESSMENT & PLAN:    Osteomyelitis of ankle or foot, acute, right [facility]) Patient is s/p surgery with Dr March Rummage on 11/23/19.  Cultures were negative but pathology confirmed osteo.  He was initially on Levaquin after surgery then transitioned to Ceftriaxone and Daptomycin 12/12/19.  Now s/p 6 weeks of IV therapy which ended 01/24/20.  He saw Dr March Rummage on 01/17/20 and x-rays showed no further erosive changes but did have worsened Charcot changes.  This may have been contributing to some edema he was having, but no further signs of infection were appreciated.  He otherwise is feeling well and I am hopeful that his infection has been eradicated and wound will continue with healing.  Discussed that he should continue to follow with podiatry and monitor for new signs or symptoms of infection after stopping antibiotics.  Follow up with ID as needed and we pulled his PICC today.   No orders of the defined types were placed in this encounter.     Raynelle [facility] 01/27/2020, 2:08 PM

## 2020-01-27 NOTE — Assessment & Plan Note (Signed)
Patient is s/p surgery with Dr March Rummage on 11/23/19.  Cultures were negative but pathology confirmed osteo.  He was initially on Levaquin after surgery then transitioned to Ceftriaxone and Daptomycin 12/12/19.  Now s/p 6 weeks of IV therapy which ended 01/24/20.  He saw Dr March Rummage on 01/17/20 and x-rays showed no further erosive changes but did have worsened Charcot changes.  This may have been contributing to some edema he was having, but no further signs of infection were appreciated.  He otherwise is feeling well and I am hopeful that his infection has been eradicated and wound will continue with healing.  Discussed that he should continue to follow with podiatry and monitor for new signs or symptoms of infection after stopping antibiotics.  Follow up with ID as needed and we pulled his PICC today.

## 2020-01-30 ENCOUNTER — Other Ambulatory Visit: Payer: Self-pay

## 2020-01-30 MED ORDER — RAMIPRIL 5 MG PO CAPS: ORAL_CAPSULE | ORAL | 0 refills | Status: DC

## 2020-01-30 MED ORDER — ATORVASTATIN CALCIUM 40 MG PO TABS
ORAL_TABLET | ORAL | 0 refills | Status: DC
Start: 2020-01-30 — End: 2020-09-25

## 2020-01-30 MED ORDER — ACCU-CHEK GUIDE VI STRP: ORAL_STRIP | 1 refills | Status: DC

## 2020-01-30 MED ORDER — HYDROCHLOROTHIAZIDE 12.5 MG PO CAPS
ORAL_CAPSULE | ORAL | 0 refills | Status: DC
Start: 2020-01-30 — End: 2020-06-01

## 2020-01-30 MED ORDER — FENOFIBRATE 48 MG PO TABS
ORAL_TABLET | ORAL | 0 refills | Status: DC
Start: 2020-01-30 — End: 2020-07-11

## 2020-01-30 NOTE — Progress Notes (Deleted)
Phone: [phone]    Subjective:  Patient presents today for their annual physical. Chief complaint-noted.   See problem oriented charting- ROS- full  review of systems was completed and negative  except for: ***  The following were reviewed and entered/updated in epic: Past Medical History:  Diagnosis Date  . Cellulitis and abscess of foot    05/ 2021 left  . CKD (chronic kidney disease), stage III ([place]) 06-17-2019 per pt last visit 05-29-2016 (note in care everywhere)  currently followed by pcp   nephrologist-  dr [name] ([facility] in [place])    . Diabetic peripheral neuropathy ([place])   . Elevated LFTs    followed by pcp  . Hypertension    followed by pcp---- (06-17-2019 pt stated never had a stress test)  . Iron deficiency anemia   . Mixed hyperlipidemia   . 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 Type 2 diabetes mellitus treated with insulin ([place])    followed by pcp   (06-17-2019  pt stated checks blood sugar daily in am,  fasting sugar-- average 71)  . Vitamin B 12 deficiency    Patient Active Problem List   Diagnosis Date Noted  . Occlusion of peripherally inserted central catheter (PICC) line ([place]) 01/10/2020  . Diabetic foot ulcer ([place]) 11/18/2019  . Charcot's joint of foot, right 11/18/2019  . Pyogenic inflammation of bone ([place]) 11/09/2019  . Osteomyelitis of ankle or foot, acute, right ([place]) 11/09/2019  . Diabetic retinopathy ([place]) 01/25/2018  . Status post split thickness skin graft 12/08/2016  . B12 deficiency 11/17/2016  . Iron deficiency anemia 11/17/2016  . Chronic kidney  disease (CKD), stage III (moderate) ([place]) 11/17/2016  . 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 Diabetic neuropathy ([place]) 11/17/2016  . Elevated LFTs   . Hyperlipidemia associated with type 2 diabetes mellitus ([place]) 03/08/2015  . Hypertension associated with diabetes ([place]) 03/08/2015  . Morbid obesity ([place]) 03/08/2015  . Type II diabetes mellitus with neurological manifestations ([place]) 03/07/2015   Past Surgical History:  Procedure Laterality Date  . AMPUTATION Right 07/31/2016   Procedure: AMPUTATION RIGHT FIFTH RAY AND APPLICATION OF WOUND VAC;  Surgeon: Dorna Leitz, MD;  Location: [facility];  Service: Orthopedics;  Laterality: Right;  . AMPUTATION Right 08/08/2016   Procedure: RAY AMPUTATION RIGHT FOURTH METATARSAL, DEBRIDEMENT OF SUBQ TISSUE,BONE AND SKIN WITH WOUND CLOSURE;  Surgeon: Dorna Leitz, MD;  Location: [facility];  Service: Orthopedics;  Laterality: Right;  . AMPUTATION TOE Left 06/21/2019   Procedure: AMPUTATION TOE SECOND LEFT, DEBRIDEMENT OF LEFT GREAT TOE;  Surgeon: [name], DPM;  Location: [facility];  Service: Podiatry;  Laterality: Left;  . ANAL FISSURE REPAIR  2000  . APPLICATION OF A-CELL OF EXTREMITY Right 08/13/2016   Procedure: APPLICATION OF A-CELL AND VAC;  Surgeon: Wallace Going, DO;  Location: [facility];  Service: Plastics;  Laterality: Right;  . APPLICATION OF A-CELL OF EXTREMITY Right 07/16/2017   Procedure: CELLERATE PLACEMENT;  Surgeon: Audelia Hives  S, DO;  Location: [facility];  Service: Plastics;  Laterality: Right;  . APPLICATION OF WOUND VAC Right 09/18/2016   Procedure: APPLICATION OF WOUND VAC (PT. WILL BRING HIS OWN);  Surgeon: Wallace Going, DO;  Location: [facility];  Service: Plastics;  Laterality: Right;  . APPLICATION OF WOUND VAC Right 10/30/2016   Procedure:  APPLICATION OF WOUND VAC;  Surgeon: Wallace Going, DO;  Location: [facility];  Service: Plastics;  Laterality: Right;  . APPLICATION OF WOUND VAC Right 12/01/2016   Procedure: APPLICATION OF WOUND VAC;  Surgeon: Wallace Going, DO;  [facility];  Service: Plastics;  Laterality: Right;  . BONE BIOPSY Left 01/04/2018   Procedure: LEFT FOOT BONE BIOPSY;  Surgeon: [name], DPM;  Location: [facility];  Service: Podiatry;  Laterality: Left;  . CIRCUMCISION  2006  . DEBRIDEMENT  FOOT Right 07/16/2017  . I & D EXTREMITY Right 09/18/2016   Procedure: IRRIGATION AND DEBRIDEMENT OF RIGHT LATERAL DIABLTIC FOOT ULCER;  Surgeon: Wallace Going, DO;  Location: [facility];  Service: Plastics;  Laterality: Right;  . I & D EXTREMITY Right 03/11/2017   Procedure: IRRIGATION AND DEBRIDEMENT OF RIGHT LATERAL FOOT WOUND WITH CELLERATE COLLAGEN;  Surgeon: Wallace Going, DO;  Location: [facility];  Service: Plastics;  Laterality: Right;  . INCISION AND DRAINAGE OF WOUND Right 08/13/2016   Procedure: IRRIGATION AND DEBRIDEMENT WOUND RIGHT FOOT;  Surgeon: Wallace Going, DO;  [facility];  Service: Plastics;  Laterality: Right;  . IR PATIENT EVAL TECH 0-60 MINS  01/12/2020  . LAPAROSCOPIC CHOLECYSTECTOMY  09-09-2006   dr Zella Richer  . LOWER EXTREMITY ANGIOGRAPHY N/A 10/27/2016   Procedure: Lower Extremity Angiography;  Surgeon: Angelia Mould, MD;  Location: [facility];  Service: Cardiovascular;  Laterality: N/A;  . MASS EXCISION Right 10/30/2016   Procedure: EXCISION RIGHT FOOT WOUND WITH VAC PLACEMENT;  Surgeon: Wallace Going, DO;  Location: [facility];  Service: Plastics;  Laterality: Right;  . MASS EXCISION Right 07/16/2017   Procedure: EXCISION OF RIGHT FOOT WOUND WITH CELLERATE PLACEMENT;  Surgeon: Wallace Going, DO;  Location: [facility];  Service: Plastics;   Laterality: Right;  . METATARSAL HEAD EXCISION Left 01/04/2018   Procedure: 5TH METATARSAL RESECTION,  WOUND CLOSURE,  Adjacent Tissue Transfer.;  Surgeon: [name], DPM;  Location: [facility];  Service: Podiatry;  Laterality: Left;  . SKIN SPLIT GRAFT Right 12/01/2016   Procedure: SKIN GRAFT SPLIT THICKNESS TO RIGHT LATERAL FOOT WOUND 9 X 3 CM;  Surgeon: Wallace Going, DO;  [facility];  Service: Plastics;  Laterality: Right;  . wisdom teeth extracted  1984    Family History  Problem Relation Age of Onset  . Stroke Father        early 57s. former smoker  . Diabetes Father   . Hypertension Father   . Sarcoidosis Mother   . Hypertension Sister   . Healthy Daughter   . Diabetes Paternal Grandmother   . Congenital heart disease Paternal Grandmother        died of complications    Medications- reviewed and updated Current Outpatient Medications  Medication Sig Dispense Refill  . ascorbic acid (VITAMIN C) 500 MG tablet Take by mouth daily.     Marland Kitchen aspirin EC 81 MG tablet Take 81 mg by mouth daily.    Marland Kitchen atorvastatin (LIPITOR) 40 MG tablet  TAKE 1 AND 1/2 TABLETS BY MOUTH EVERY MORNING 135 tablet 0  . Cyanocobalamin (VITAMIN B-12) 5000 MCG SUBL Place 5,000 mcg under the tongue daily.    . fenofibrate (TRICOR) 48 MG tablet TAKE 1 TABLET(48 MG) BY MOUTH DAILY 90 tablet 0  . ferrous sulfate 325 (65 FE) MG tablet Take 1 tablet (325 mg total) by mouth 2 (two) times daily with a meal. (Patient taking differently: Take 325 mg by mouth 2 (two) times daily with a meal.) 60 tablet 3  . glucose blood (ACCU-CHEK GUIDE) test strip Dx E11.49 - Use to check blood glucose three times daily or as directed. 300 each 1  . hydrochlorothiazide (MICROZIDE) 12.5 MG capsule TAKE 1 CAPSULE(12.5 MG) BY MOUTH DAILY IN THE MORNING 90 capsule 0  . HYDROcodone-acetaminophen (NORCO) 5-325 MG tablet Take 1 tablet by mouth every 6 (six) hours as needed for moderate pain. 30 tablet 0  . insulin aspart (NOVOLOG)  100 UNIT/ML injection INJECT 15 TO 25 UNITS UNDER THE SKIN TWICE DAILY 50 mL 3  . Insulin Syringe-Needle U-100 (BD INSULIN SYRINGE U/F) 31G X 5/16" 0.5 ML MISC Use to inject insulin 4 times daily 100 each 4  . Insulin Syringe-Needle U-100 (BD VEO INSULIN SYR U/F 1/2UNIT) 31G X 15/64" 0.3 ML MISC USE TO INJECT INSULIN FOUR TIMES DAILY AS DIRECTED 300 each 2  . Lancets MISC USED TO CHECK BLOOD GLUCOSE 3-4 TIMES DAILY 200 each 3  . levofloxacin (LEVAQUIN) 500 MG tablet TAKE 1 TABLET(500 MG) BY MOUTH DAILY 10 tablet 0  . Multiple Vitamin (MULTIVITAMIN WITH MINERALS) TABS tablet Take 1 tablet by mouth 2 (two) times daily.    . Omega-3 Fatty Acids (FISH OIL) 1360 MG CAPS Take 1,360 mg by mouth daily.     Marland Kitchen pyridoxine (B-6) 100 MG tablet Take 100 mg by mouth 2 (two) times daily.    . ramipril (ALTACE) 5 MG capsule TAKE 1 CAPSULE(5 MG) BY MOUTH EVERY MORNING 90 capsule 0  . silver sulfADIAZINE (SILVADENE) 1 % cream Apply pea-sized amount to wound daily. 50 g 0  . sitaGLIPtin (JANUVIA) 50 MG tablet Take 1 tablet (50 mg total) by mouth every morning. (Patient taking differently: Take 50 mg by mouth every morning.) 90 tablet 3  . TRESIBA 100 UNIT/ML SOLN ADMINISTER 64 UNITS UNDER THE SKIN AT BEDTIME 60 mL 1   No current facility-administered medications for this visit.    Allergies-reviewed and updated No Known Allergies  Social History   Social History Narrative   Married. 64 year old daughter 10/2016.       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       Hobbies: drawing, archery, tinkering      Objective:  There were no vitals taken for this visit. Gen: NAD, resting comfortably HEENT: Mucous membranes are moist. Oropharynx normal Neck: no thyromegaly CV: RRR no murmurs rubs or gallops Lungs: CTAB no crackles, wheeze, rhonchi Abdomen: soft/nontender/nondistended/normal bowel sounds. No rebound or guarding.   Ext: no edema Skin: warm, dry Neuro: grossly normal, moves all extremities, PERRLA ***    Assessment and Plan:  50 y.o. male presenting for annual physical.  Health Maintenance counseling: 1. Anticipatory guidance: Patient counseled regarding regular dental exams ***q6 months, eye exams ***,  avoiding smoking and second hand smoke*** , limiting alcohol to 2 beverages per day***.   2. Risk factor reduction:  Advised patient of need for regular exercise and  diet rich and fruits and vegetables to reduce risk of heart attack and stroke. Exercise- ***. Diet-***.  Wt Readings from Last 3 Encounters:  01/27/20 (!) 324 lb 6.4 oz (147.1 kg)  01/10/20 (!) 322 lb (146.1 kg)  12/09/19 (!) 313 lb (142 kg)   3. Immunizations/screenings/ancillary studies Immunization History  Administered Date(s) Administered  . Influenza, High Dose Seasonal PF 10/06/2017  . Influenza,inj,Quad PF,6+ Mos 11/17/2016, 10/15/2018, 11/24/2019  . PFIZER SARS-COV-2 Vaccination 04/16/2019, 05/09/2019  . Pneumococcal Polysaccharide-23 02/02/2017  . Tdap 09/24/2007, 10/06/2017   Health Maintenance Due  Topic Date Due  . Hepatitis C Screening  Never done  . COLONOSCOPY (Pts 45-26yrs Insurance coverage will need to be confirmed)  Never done  . OPHTHALMOLOGY EXAM  06/11/2019  . FOOT EXAM  10/15/2019    Family History  Problem Relation Age of Onset  . Stroke Father        early 83s. former smoker  . Diabetes Father   . Hypertension Father   . Sarcoidosis Mother   . Hypertension Sister   . Healthy Daughter   . Diabetes Paternal Grandmother   . Congenital heart disease Paternal Grandmother        died of complications   4. Prostate cancer screening- ***  Lab Results  Component Value Date   PSA 0.46 10/15/2018   5. Colon cancer screening - *** 6. Skin cancer screening/prevention- ***advised regular sunscreen use. Denies worrisome, changing, or new skin lesions.  7. Testicular cancer screening- advised monthly  self exams *** 8. STD screening- patient opts *** 9. *** smoker-   Status of chronic or acute concerns  # B12 deficiency S: Current treatment/medication (oral vs. IM): ***  Lab Results  Component Value Date   VITAMINB12 >1500 (H) 10/15/2018   A/P: ***   #hyperlipidemia S: Medication:***  Lab Results  Component Value Date   CHOL 140 01/17/2019   HDL 40.90 10/15/2018   LDLCALC 81 01/17/2019   LDLDIRECT 61.0 06/14/2019   TRIG 112.0 10/15/2018   CHOLHDL 4 10/15/2018   A/P: ***  # Diabetes S: Medication:Novolog, Altace 5Mg , Tyler Aas  CBGs- *** Exercise and diet- *** Lab Results  Component Value Date   HGBA1C 6.7 (H) 11/09/2019   HGBA1C 5.5 06/14/2019   HGBA1C 6.4 01/17/2019    A/P: ***  #hypertension S: medication: HCTZ 12.5Mg  Home readings #s: *** BP Readings from Last 3 Encounters:  01/27/20 (!) 161/89  01/10/20 (!) 157/93  12/09/19 (!) 143/88  A/P: ***   insurance wouldnt cover until age 28 colonoscopy- may retry at 25*** with new guidelines  ***ulcer left 2nd toe 06/10/19 *** podiatry drained *** No diagnosis found.  Recommended follow up: ***No follow-ups on file. Future Appointments  Date Time Provider [place]  01/31/2020  8:40 AM Marin Olp, MD [facility]  01/31/2020  2:15 PM Velora Heckler [facility]  01/31/2020  3:45 PM Price, Christian Mate, DPM [facility]    No chief complaint on file.  Lab/Order associations:*** fasting No diagnosis found.  No orders of the defined types were placed in this encounter.   Return precautions advised.   Clyde Lundborg, CMA

## 2020-01-30 NOTE — Patient Instructions (Incomplete)
Please stop by lab before you go If you have mychart- we will send your results within 3 business days of [place] receiving them.  If you do not have mychart- we will call you about results within 5 business days of [place] receiving them.  *please also note that you will see labs on mychart as soon as they post. I will later go in and write notes on them- will say "notes from Dr. Yong Channel"  Diabetes Health Maintenance Due  Topic Date Due  . OPHTHALMOLOGY EXAM  06/11/2019  . FOOT EXAM  10/15/2019   Depression screen [facility] 2/9 12/09/2019 11/09/2019 06/14/2019  Decreased Interest 0 0 0  Down, Depressed, Hopeless 0 0 0  PHQ - 2 Score 0 0 0  Altered sleeping - - 0  Tired, decreased energy - - 0  Change in appetite - - 0  Feeling bad or failure about yourself  - - 0  Trouble concentrating - - 0  Moving slowly or fidgety/restless - - 0  Suicidal thoughts - - 0  PHQ-9 Score - - 0  Difficult doing work/chores - - Not difficult at all

## 2020-01-31 ENCOUNTER — Other Ambulatory Visit: Payer: Self-pay

## 2020-01-31 ENCOUNTER — Encounter: Payer: BLUE CROSS/BLUE SHIELD | Admitting: Family Medicine

## 2020-01-31 ENCOUNTER — Ambulatory Visit (INDEPENDENT_AMBULATORY_CARE_PROVIDER_SITE_OTHER): Payer: BLUE CROSS/BLUE SHIELD | Admitting: Orthotics

## 2020-01-31 ENCOUNTER — Ambulatory Visit (INDEPENDENT_AMBULATORY_CARE_PROVIDER_SITE_OTHER): Payer: BLUE CROSS/BLUE SHIELD | Admitting: Podiatry

## 2020-01-31 DIAGNOSIS — I152 Hypertension secondary to endocrine disorders: Secondary | ICD-10-CM

## 2020-01-31 DIAGNOSIS — L97513 Non-pressure chronic ulcer of other part of right foot with necrosis of muscle: Secondary | ICD-10-CM

## 2020-01-31 DIAGNOSIS — E08621 Diabetes mellitus due to underlying condition with foot ulcer: Secondary | ICD-10-CM

## 2020-01-31 DIAGNOSIS — M14671 Charcot's joint, right ankle and foot: Secondary | ICD-10-CM

## 2020-01-31 DIAGNOSIS — M86171 Other acute osteomyelitis, right ankle and foot: Secondary | ICD-10-CM

## 2020-01-31 DIAGNOSIS — E1169 Type 2 diabetes mellitus with other specified complication: Secondary | ICD-10-CM

## 2020-01-31 DIAGNOSIS — E538 Deficiency of other specified B group vitamins: Secondary | ICD-10-CM

## 2020-01-31 DIAGNOSIS — Z0000 Encounter for general adult medical examination without abnormal findings: Secondary | ICD-10-CM

## 2020-01-31 DIAGNOSIS — E11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ical complication: Secondary | ICD-10-CM

## 2020-01-31 DIAGNOSIS — L03115 Cellulitis of right lower limb: Secondary | ICD-10-CM

## 2020-01-31 DIAGNOSIS — Z89421 Acquired absence of other right toe(s): Secondary | ICD-10-CM

## 2020-01-31 DIAGNOSIS — Z9889 Other specified postprocedural states: Secondary | ICD-10-CM

## 2020-01-31 NOTE — Progress Notes (Signed)
  Subjective:  Patient ID: [name], male    DOB: [birth year],  MRN: [id number]  Chief Complaint  Patient presents with  . Routine Post Op    POV#5 Pt states no concerns, denies fever/chills/nausea/vomiting    DOS: 11/23/19 Procedure: Right foot saucerization of bone for osteomyelitis, insertion of abx beads, bone biopsy   50 y.o. male presents with the above complaint. History confirmed with patient.  Only had 1 episode of drainage a couple days ago that was less than a dime in size  Objective:  Physical Exam: no tenderness at the surgical site, local edema noted and calf supple, nontender. Incision: healing well, no significant drainage, no dehiscence, no significant erythema.  There is small residual open area with surrounding hyperkeratosis with approximately 0.5 cm probing without probe to bone Left foot epithelialized blister  Assessment:   1. Diabetic ulcer of other part of right foot associated with diabetes mellitus due to underlying condition, with necrosis of muscle ([place])   2. Charcot's joint of foot, right   3. Post-operative state     Plan:  Patient was evaluated and treated and all questions answered.  Post-operative State -Wound improved right lower extremity.  There is no deep probing noted.  There is no active drainage noted.  I think at this point we can proceed with getting out of the protective shoe gear and returning to his normal shoes.  He is to alternate Silvadene and Betadine to the foot every other day.  Left foot with out open ulceration       No follow-ups on file.

## 2020-01-31 NOTE — Progress Notes (Signed)
Cast today for [id number] inserts/fo to address amptuated 4/5 ray R.   Repeat previous order with new cast.

## 2020-02-08 ENCOUNTER — Other Ambulatory Visit: Payer: Self-pay

## 2020-02-08 MED ORDER — TRESIBA 100 UNIT/ML ~~LOC~~ SOLN: SUBCUTANEOUS | 3 refills | Status: DC

## 2020-02-14 ENCOUNTER — Telehealth: Payer: Self-pay

## 2020-02-14 MED ORDER — INSULIN ASPART 100 UNIT/ML ~~LOC~~ SOLN
SUBCUTANEOUS | 3 refills | Status: DC
Start: 2020-02-14 — End: 2021-04-01

## 2020-02-14 NOTE — Telephone Encounter (Signed)
..   LAST APPOINTMENT DATE: 01/30/2020   NEXT APPOINTMENT DATE:@2 /04/2020  MEDICATION:insulin aspart (NOVOLOG) 100 UNIT/ML injection    PHARMACY:[id number] [place], [place]  *

## 2020-02-14 NOTE — Telephone Encounter (Signed)
Rx sent 

## 2020-02-21 ENCOUNTER — Ambulatory Visit (INDEPENDENT_AMBULATORY_CARE_PROVIDER_SITE_OTHER): Payer: BLUE CROSS/BLUE SHIELD | Admitting: Podiatry

## 2020-02-21 ENCOUNTER — Other Ambulatory Visit: Payer: Self-pay

## 2020-02-21 DIAGNOSIS — E08621 Diabetes mellitus due to underlying condition with foot ulcer: Secondary | ICD-10-CM

## 2020-02-21 DIAGNOSIS — Z9889 Other specified postprocedural states: Secondary | ICD-10-CM

## 2020-02-21 DIAGNOSIS — L97513 Non-pressure chronic ulcer of other part of right foot with necrosis of muscle: Secondary | ICD-10-CM

## 2020-02-23 NOTE — Patient Instructions (Addendum)
Please stop by lab before you go If you have mychart- we will send your results within 3 business days of [place] receiving them.  If you do not have mychart- we will call you about results within 5 business days of [place] receiving them.  *please also note that you will see labs on mychart as soon as they post. I will later go in and write notes on them- will say "notes from Dr. Yong Channel"   Health Maintenance Due  Topic Date Due  . Hepatitis C Screening Today In office. Never done  . COLONOSCOPY (Pts 45-38yrs Insurance coverage will need to be confirmed) will refer in 2 months Never done  . OPHTHALMOLOGY EXAM He is going to get this scheduled. Also have them send [place] last note and upcoming note 06/11/2019   Shingrix #1 schedule after march 24th then. Repeat injection in 2-6 months later. Schedule a nurse visit for the 1st and 2nd injection before you leave today  Recommended follow up: Return in about 4 months (around 06/23/2020) for follow up- or sooner if needed.

## 2020-02-23 NOTE — Progress Notes (Signed)
Phone: [phone]    Subjective:  Patient presents today for their annual physical. Chief complaint-noted.   See problem oriented charting- ROS- full  review of systems was completed and negative  except for: leg/foot swelling  The following were reviewed and entered/updated in epic: Past Medical History:  Diagnosis Date  . Cellulitis and abscess of foot    05/ 2021 left  . CKD (chronic kidney disease), stage III ([place]) 06-17-2019 per pt last visit 05-29-2016 (note in care everywhere)  currently followed by pcp   nephrologist-  dr [name] ([facility] in [place])    . Diabetic peripheral neuropathy ([place])   . Elevated LFTs    followed by pcp  . Hypertension    followed by pcp---- (06-17-2019 pt stated never had a stress test)  . Iron deficiency anemia   . Mixed hyperlipidemia   . 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 Type 2 diabetes mellitus treated with insulin ([place])    followed by pcp   (06-17-2019  pt stated checks blood sugar daily in am,  fasting sugar-- average 71)  . Vitamin B 12 deficiency    Patient Active Problem List   Diagnosis Date Noted  . Morbid obesity ([place]) 03/08/2015    Priority: High  . Type II diabetes mellitus with neurological manifestations ([place]) 03/07/2015    Priority: High  . Diabetic retinopathy ([place]) 01/25/2018    Priority: Medium  . Chronic kidney disease (CKD), stage III (moderate) ([place]) 11/17/2016    Priority: Medium  . Diabetic neuropathy ([place]) 11/17/2016    Priority: Medium  . Elevated LFTs     Priority: Medium  . Hyperlipidemia associated with type 2 diabetes  mellitus ([place]) 03/08/2015    Priority: Medium  . Hypertension associated with diabetes ([place]) 03/08/2015    Priority: Medium  . Status post split thickness skin graft 12/08/2016    Priority: Low  . B12 deficiency 11/17/2016    Priority: Low  . Iron deficiency anemia 11/17/2016    Priority: Low  . 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 Occlusion of peripherally inserted central catheter (PICC) line ([place]) 01/10/2020  . Diabetic foot ulcer ([place]) 11/18/2019  . Charcot's joint of foot, right 11/18/2019  . Pyogenic inflammation of bone ([place]) 11/09/2019  . Osteomyelitis of ankle or foot, acute, right ([place]) 11/09/2019   Past Surgical History:  Procedure Laterality Date  . AMPUTATION Right 07/31/2016   Procedure: AMPUTATION RIGHT FIFTH RAY AND APPLICATION OF WOUND VAC;  Surgeon: Dorna Leitz, MD;  Location: [facility];  Service: Orthopedics;  Laterality: Right;  . AMPUTATION Right 08/08/2016   Procedure: RAY AMPUTATION RIGHT FOURTH METATARSAL, DEBRIDEMENT OF SUBQ TISSUE,BONE AND SKIN WITH WOUND CLOSURE;  Surgeon: Dorna Leitz, MD;  Location: [facility];  Service: Orthopedics;  Laterality: Right;  . AMPUTATION TOE Left 06/21/2019   Procedure: AMPUTATION TOE SECOND LEFT, DEBRIDEMENT OF LEFT GREAT TOE;  Surgeon: [name], DPM;  Location: [facility];  Service: Podiatry;  Laterality: Left;  . ANAL FISSURE REPAIR  2000  . APPLICATION OF A-CELL OF EXTREMITY Right 08/13/2016   Procedure: APPLICATION OF A-CELL AND VAC;  Surgeon: Wallace Going, DO;  [facility]: [facility];  Service: Plastics;  Laterality: Right;  . APPLICATION OF A-CELL OF EXTREMITY Right 07/16/2017   Procedure: CELLERATE PLACEMENT;  Surgeon: Wallace Going, DO;  Location: [facility];  Service: Plastics;  Laterality: Right;  . APPLICATION OF WOUND  VAC Right 09/18/2016   Procedure: APPLICATION OF WOUND VAC (PT. WILL BRING HIS OWN);  Surgeon: Wallace Going, DO;  Location: [facility];  Service: Plastics;  Laterality: Right;  . APPLICATION OF WOUND VAC Right 10/30/2016   Procedure: APPLICATION OF WOUND VAC;  Surgeon: Wallace Going, DO;  Location: [facility];  Service: Plastics;  Laterality: Right;  . APPLICATION OF WOUND VAC Right 12/01/2016   Procedure: APPLICATION OF WOUND VAC;  Surgeon: Wallace Going, DO;  [facility];  Service: Plastics;  Laterality: Right;  . BONE BIOPSY Left 01/04/2018   Procedure: LEFT FOOT BONE BIOPSY;  Surgeon: [name], DPM;  Location: [facility];  Service: Podiatry;  Laterality: Left;  . CIRCUMCISION  2006  . DEBRIDEMENT  FOOT Right 07/16/2017  . I & D EXTREMITY Right 09/18/2016   Procedure: IRRIGATION AND DEBRIDEMENT OF RIGHT LATERAL DIABLTIC FOOT ULCER;  Surgeon: Wallace Going, DO;  Location: [facility];  Service: Plastics;  Laterality: Right;  . I & D EXTREMITY Right 03/11/2017   Procedure: IRRIGATION AND DEBRIDEMENT OF RIGHT LATERAL FOOT WOUND WITH CELLERATE COLLAGEN;  Surgeon: Wallace Going, DO;  Location: [facility];  Service: Plastics;  Laterality: Right;  . INCISION AND DRAINAGE OF WOUND Right 08/13/2016   Procedure: IRRIGATION AND DEBRIDEMENT WOUND RIGHT FOOT;  Surgeon: Wallace Going, DO;  [facility];  Service: Plastics;  Laterality: Right;  . IR PATIENT EVAL TECH 0-60 MINS  01/12/2020  . LAPAROSCOPIC CHOLECYSTECTOMY  09-09-2006   dr Zella Richer  . LOWER EXTREMITY ANGIOGRAPHY N/A 10/27/2016   Procedure: Lower Extremity Angiography;  Surgeon: Angelia Mould, MD;  Location: [facility];  Service: Cardiovascular;  Laterality: N/A;  . MASS EXCISION Right 10/30/2016   Procedure: EXCISION RIGHT FOOT WOUND WITH VAC PLACEMENT;  Surgeon: Wallace Going, DO;  Location: [facility];  Service: Plastics;  Laterality: Right;  . MASS EXCISION Right 07/16/2017   Procedure: EXCISION OF RIGHT FOOT WOUND WITH CELLERATE PLACEMENT;  Surgeon: Wallace Going, DO;  Location: [facility];  Service: Plastics;  Laterality: Right;  . METATARSAL HEAD EXCISION Left 01/04/2018   Procedure: 5TH METATARSAL RESECTION,  WOUND CLOSURE,  Adjacent Tissue Transfer.;  Surgeon: [name], DPM;  Location: [facility];  Service: Podiatry;  Laterality: Left;  . SKIN SPLIT GRAFT Right 12/01/2016   Procedure: SKIN GRAFT SPLIT THICKNESS TO RIGHT LATERAL FOOT WOUND 9 X 3 CM;  Surgeon: Wallace Going, DO;  [facility];  Service: Plastics;  Laterality: Right;  . wisdom teeth extracted  1984    Family History  Problem Relation Age of Onset  . Stroke Father        early 61s. former smoker  . Diabetes Father   . Hypertension Father   . Sarcoidosis Mother   . Hypertension Sister   . Healthy Daughter   . Diabetes Paternal Grandmother   . Congenital heart  disease Paternal Grandmother        died of complications    Medications- reviewed and updated Current Outpatient Medications  Medication Sig Dispense Refill  . ascorbic acid (VITAMIN C) 500 MG tablet Take by mouth daily.     Marland Kitchen aspirin EC 81 MG tablet Take 81 mg by mouth daily.    Marland Kitchen atorvastatin (LIPITOR) 40 MG tablet TAKE 1 AND 1/2 TABLETS BY MOUTH EVERY MORNING 135 tablet 0  . Cyanocobalamin (VITAMIN B-12) 5000 MCG SUBL Place 5,000 mcg under the tongue daily.    . fenofibrate (TRICOR) 48 MG tablet TAKE 1 TABLET(48 MG) BY MOUTH DAILY 90 tablet 0  . ferrous sulfate 325 (65 FE) MG tablet Take 1 tablet (325 mg total) by mouth 2 (two) times daily with a meal. (Patient taking differently: Take 325 mg by mouth 2 (two) times daily with a meal.) 60 tablet 3  . glucose blood (ACCU-CHEK GUIDE) test strip Dx E11.49 - Use to check blood glucose three times daily or as directed. 300 each 1  . hydrochlorothiazide  (MICROZIDE) 12.5 MG capsule TAKE 1 CAPSULE(12.5 MG) BY MOUTH DAILY IN THE MORNING 90 capsule 0  . insulin aspart (NOVOLOG) 100 UNIT/ML injection INJECT 15 TO 25 UNITS UNDER THE SKIN TWICE DAILY 50 mL 3  . Insulin Degludec (TRESIBA) 100 UNIT/ML SOLN ADMINISTER 64 UNITS UNDER THE SKIN AT BEDTIME 60 mL 3  . Insulin Syringe-Needle U-100 (BD INSULIN SYRINGE U/F) 31G X 5/16" 0.5 ML MISC Use to inject insulin 4 times daily 100 each 4  . Insulin Syringe-Needle U-100 (BD INSULIN SYRINGE U/F) 31G X 5/16" 1 ML MISC Use to inject tresiba/long acting insulin once daily 100 each 3  . Insulin Syringe-Needle U-100 (BD VEO INSULIN SYR U/F 1/2UNIT) 31G X 15/64" 0.3 ML MISC USE TO INJECT INSULIN FOUR TIMES DAILY AS DIRECTED 300 each 2  . Lancets MISC USED TO CHECK BLOOD GLUCOSE 3-4 TIMES DAILY 200 each 3  . Multiple Vitamin (MULTIVITAMIN WITH MINERALS) TABS tablet Take 1 tablet by mouth 2 (two) times daily.    . Omega-3 Fatty Acids (FISH OIL) 1360 MG CAPS Take 1,360 mg by mouth daily.     Marland Kitchen pyridoxine (B-6) 100 MG tablet Take 100 mg by mouth 2 (two) times daily.    . ramipril (ALTACE) 5 MG capsule TAKE 1 CAPSULE(5 MG) BY MOUTH EVERY MORNING 90 capsule 0  . sitaGLIPtin (JANUVIA) 50 MG tablet Take 1 tablet (50 mg total) by mouth every morning. (Patient taking differently: Take 50 mg by mouth every morning.) 90 tablet 3   No current facility-administered medications for this visit.    Allergies-reviewed and updated No Known Allergies  Social History   Social History Narrative   Married. 35 year old daughter 10/2016.       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       Hobbies: drawing, archery, tinkering     Objective:  BP 124/76   Pulse 77   Temp 98.8 F (37.1 C) (Temporal)   Ht 6' (1.829 m)   Wt  (!) 310 lb 12.8 oz (141 kg)   SpO2 98%   BMI 42.15 kg/m  Gen: NAD, resting comfortably HEENT: Mucous membranes are moist. Oropharynx normal Neck: no thyromegaly CV: RRR no murmurs rubs or gallops Lungs: CTAB no crackles, wheeze,  rhonchi Abdomen: soft/nontender/nondistended/normal bowel sounds. No rebound or guarding.  Ext: no edema Skin: warm, dry Neuro: grossly normal, moves all extremities, PERRLA Slightly macerated area on lateral portion of right foot- has just seen podiatry     Assessment and Plan:  50 y.o. male presenting for annual physical.  Health Maintenance counseling: 1. Anticipatory guidance: Patient counseled regarding regular dental exams -q6 months, eye exams - q6 months retinopathy,  avoiding smoking and second hand smoke , limiting alcohol to 2 beverages per day- doesn't drink, no drugs.   2. Risk factor reduction:  Advised patient of need for regular exercise and diet rich and fruits and vegetables to reduce risk of heart attack and stroke. Exercise- limited by recent foot surgery- doing a lot of walking now and constructing a new plant so he will get a lot of steps in. Diet-comfort eating lately with stress from surgery and forced sedentary activity- working on turning this around- protein shakes nad packing lunch for [employer].  Wt Readings from Last 3 Encounters:  02/24/20 (!) 310 lb 12.8 oz (141 kg)  01/27/20 (!) 324 lb 6.4 oz (147.1 kg)  01/10/20 (!) 322 lb (146.1 kg)   3. Immunizations/screenings/ancillary studies- shingrix planned once gets to age 27. HCV screen with labs Immunization History  Administered Date(s) Administered  . Influenza, High Dose Seasonal PF 10/06/2017  . Influenza,inj,Quad PF,6+ Mos 11/17/2016, 10/15/2018, 11/24/2019  . PFIZER(Purple Top)SARS-COV-2 Vaccination 04/16/2019, 05/09/2019, 02/14/2020  . Pneumococcal Polysaccharide-23 02/02/2017  . Tdap 09/24/2007, 10/06/2017   4. Prostate cancer screening- will trend psa with labs  Lab Results   Component Value Date   PSA 0.46 10/15/2018   5. Colon cancer screening - refer in 2 months- I set a reminder as he would like to do this right around 62- insurance rejected at age 61 6. Skin cancer screening/prevention- lower risk due to melanin content. advised regular sunscreen use. Denies worrisome, changing, or new skin lesions.  7. Testicular cancer screening- advised monthly self exams  8. STD screening- patient opts out- only with wife 9. never smoker-   Status of chronic or acute concerns   # Diabetes S: Medication: NovoLog 15 to 20 units twice daily (20 lately).  Also takes [place] at night up to 80 units lately.  Takes Januvia 50 mg in the morning. CBGs- sugars have been variable- was out of insulin due to pharmacy issue finally resolved but trying stabilize sugars again. He is good at [employer] adjustments on insulin- usually between 80-120 fasting as long as not out of insulin Exercise and diet- weight up 13 pounds from last visit here- picked up weight with sedentary activity with surgery Lab Results  Component Value Date   HGBA1C 6.7 (H) 11/09/2019   HGBA1C 5.5 06/14/2019   HGBA1C 6.4 01/17/2019    A/P: hopefully controlled- update a1c- continue current meds for now. May be slightly higher with recently being out of insulin (was out for 4 days and trying to get sugars to trend back down)  -Patient with diabetic retinopathy-reports this has been stable follow-up with Dr. Peter Garter will be switching back to Dr. Gershon Crane most likely.  Continue to monitor and try to control A1c -Diabetic neuropathy certainly contributes to diabetic wounds/amputations. He has finished 6 weeks of antibiotic after most recent surgery- some intermittent swelling but otherwise doing well. Just saw Dr. March Rummage on 02/21/20 and did have a clean out- pain has improved since that time. Doing a good job with compression stockings  # B12 deficiency/anemia  S:  Current treatment/medication (oral vs. IM): Cyanocobalamin  5000 mcg daily  Also takes ferrous sulfate supplements twice daily Lab Results  Component Value Date   VITAMINB12 >1500 (H) 10/15/2018  A/P: We will trend make sure no progressive anemia.  Since not a great time for colonoscopy we will also update stool cards  #hyperlipidemia S: Medication: atorvastatin 60Mg, fenofibrate-does get some myalgias so we have been hesitant to increase dose- better ltaely  Lab Results  Component Value Date   CHOL 140 01/17/2019   HDL 40.90 10/15/2018   LDLCALC 81 01/17/2019   LDLDIRECT 61.0 06/14/2019   TRIG 112.0 10/15/2018   CHOLHDL 4 10/15/2018   A/P: We will update full lipid panel-hoping for LDL under 70 as it was last May Hepatic Function Latest Ref Rng & Units 12/09/2019 11/09/2019 06/14/2019  Total Protein 6.1 - 8.1 g/dL 8.0 7.6 7.7  Albumin 3.5 - 5.2 g/dL - - 3.7  AST 10 - 40 U/L 34 27 26  ALT 9 - 46 U/L 50(H) 31 35  Alk Phosphatase 39 - 117 U/L - - 97  Total Bilirubin 0.2 - 1.2 mg/dL 0.5 1.0 0.5  Bilirubin, Direct 0.1 - 0.5 mg/dL - - -  Mild elevation we will monitor on statin  #hypertension/CKD stage III S: medication: HCTZ 12.5Mg and ramipril 5 mg Home readings #s: Does not check BP Readings from Last 3 Encounters:  02/24/20 124/76  01/27/20 (!) 161/89  01/10/20 (!) 157/93  A/P: Blood pressure much improved today from last few visits-continue current medication  Recommended follow up: Return in about 4 months (around 06/23/2020) for follow up- or sooner if needed. Future Appointments  Date Time Provider [place]  03/09/2020  4:00 PM Price, Christian Mate, DPM [facility]   Lab/Order associations:NOT  Fasting- protein shake 6 am   ICD-10-CM   1. Preventative health care  Z00.00 CBC with Differential/Platelet    Comprehensive metabolic panel    Lipid panel    Hemoglobin A1c    Hepatitis C antibody    Vitamin B12    PSA  2. Hypertension associated with diabetes ([place])  E11.59 CBC with Differential/Platelet   I15.2  Comprehensive metabolic panel  3. Hyperlipidemia associated with type 2 diabetes mellitus ([facility])  E11.69 Lipid panel   E78.5   4. Type II diabetes mellitus with neurological manifestations ([facility])  E11.49 CBC with Differential/Platelet    Comprehensive metabolic panel    Lipid panel    Hemoglobin A1c  5. Stage 3 chronic kidney disease, unspecified whether stage 3a or 3b CKD ([facility])  N18.30   6. B12 deficiency  E53.8 Vitamin B12  7. Encounter for screening colonoscopy  Z12.11   8. Screen for colon cancer  Z12.11   9. Encounter for hepatitis C screening test for low risk patient  Z11.59 Hepatitis C antibody  10. Screening for prostate cancer  Z12.5 PSA  11. 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 Morbid obesity ([place]) Chronic E66.01     Meds ordered this encounter  Medications  . Insulin Syringe-Needle U-100 (BD INSULIN SYRINGE U/F) 31G X 5/16" 1 ML MISC    Sig: Use to inject tresiba/long acting insulin once daily    Dispense:  100 each    Refill:  3   Return precautions advised.   [name], MD

## 2020-02-24 ENCOUNTER — Other Ambulatory Visit: Payer: Self-pay

## 2020-02-24 ENCOUNTER — Encounter: Payer: Self-pay | Admitting: Family Medicine

## 2020-02-24 ENCOUNTER — Ambulatory Visit (INDEPENDENT_AMBULATORY_CARE_PROVIDER_SITE_OTHER): Payer: BLUE CROSS/BLUE SHIELD | Admitting: Family Medicine

## 2020-02-24 VITALS — BP 124/76 | HR 77 | Temp 98.8°F | Ht 72.0 in | Wt 310.8 lb

## 2020-02-24 DIAGNOSIS — E1169 Type 2 diabetes mellitus with other specified complication: Secondary | ICD-10-CM | POA: Diagnosis not present

## 2020-02-24 DIAGNOSIS — Z89421 Acquired absence of other right toe(s): Secondary | ICD-10-CM

## 2020-02-24 DIAGNOSIS — N183 Chronic kidney disease, stage 3 unspecified: Secondary | ICD-10-CM

## 2020-02-24 DIAGNOSIS — E11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ical complication: Secondary | ICD-10-CM | POA: Diagnosis not present

## 2020-02-24 DIAGNOSIS — E785 Hyperlipidemia, unspecified: Secondary | ICD-10-CM

## 2020-02-24 DIAGNOSIS — I152 Hypertension secondary to endocrine disorders: Secondary | ICD-10-CM

## 2020-02-24 DIAGNOSIS — E538 Deficiency of other specified B group vitamins: Secondary | ICD-10-CM

## 2020-02-24 DIAGNOSIS — Z1159 Encounter for screening for other viral diseases: Secondary | ICD-10-CM

## 2020-02-24 DIAGNOSIS — Z0000 Encounter for general adult medical examination without abnormal findings: Secondary | ICD-10-CM | POA: Diagnosis not present

## 2020-02-24 DIAGNOSIS — E1159 Type 2 diabetes mellitus with other circulatory complications: Secondary | ICD-10-CM | POA: Diagnosis not present

## 2020-02-24 DIAGNOSIS — Z1211 Encounter for screening for malignant neoplasm of colon: Secondary | ICD-10-CM

## 2020-02-24 DIAGNOSIS — Z125 Encounter for screening for malignant neoplasm of prostate: Secondary | ICD-10-CM

## 2020-02-24 MED ORDER — "INSULIN SYRINGE-NEEDLE U-100 31G X 5/16"" 1 ML MISC": 3 refills | Status: DC

## 2020-02-24 NOTE — Addendum Note (Signed)
Addended by: Adah Salvage F on: 02/24/2020 03:33 PM   Modules accepted: Orders

## 2020-02-24 NOTE — Addendum Note (Signed)
Addended by: Adah Salvage F on: 02/24/2020 03:25 PM   Modules accepted: Orders

## 2020-02-24 NOTE — Addendum Note (Signed)
Addended by: Adah Salvage F on: 02/24/2020 03:31 PM   Modules accepted: Orders

## 2020-02-25 LAB — HEMOGLOBIN A1C
Hgb A1c MFr Bld: 7.3 % of total Hgb — ABNORMAL HIGH (ref <5.7)
Mean Plasma Glucose: 163 mg/dL
eAG (mmol/L): 9 mmol/L

## 2020-02-25 LAB — VITAMIN B12: Vitamin B-12: >2000 pg/mL — ABNORMAL HIGH (ref 200–1100)

## 2020-02-25 LAB — COMPREHENSIVE METABOLIC PANEL
AG Ratio: 1.1 (calc) (ref 1.0–2.5)
ALT: 31 U/L (ref 9–46)
AST: 26 U/L (ref 10–40)
Albumin: 4.1 g/dL (ref 3.6–5.1)
Alkaline phosphatase (APISO): 72 U/L (ref 36–130)
BUN/Creatinine Ratio: 15 (calc) (ref 6–22)
BUN: 22 mg/dL (ref 7–25)
CO2: 29 mmol/L (ref 20–32)
Calcium: 10.3 mg/dL (ref 8.6–10.3)
Chloride: 104 mmol/L (ref 98–110)
Creat: 1.47 mg/dL — ABNORMAL HIGH (ref 0.60–1.35)
Globulin: 3.8 g/dL (calc) — ABNORMAL HIGH (ref 1.9–3.7)
Glucose, Bld: 76 mg/dL (ref 65–99)
Potassium: 4.2 mmol/L (ref 3.5–5.3)
Sodium: 140 mmol/L (ref 135–146)
Total Bilirubin: 1.1 mg/dL (ref 0.2–1.2)
Total Protein: 7.9 g/dL (ref 6.1–8.1)

## 2020-02-25 LAB — LIPID PANEL
Cholesterol: 189 mg/dL (ref <200)
HDL: 38 mg/dL — ABNORMAL LOW (ref >=40)
LDL Cholesterol (Calc): 131 mg/dL (calc) — ABNORMAL HIGH
Non-HDL Cholesterol (Calc): 151 mg/dL (calc) — ABNORMAL HIGH (ref <130)
Total CHOL/HDL Ratio: 5 (calc) — ABNORMAL HIGH (ref <5.0)
Triglycerides: 102 mg/dL (ref <150)

## 2020-02-25 LAB — CBC WITH DIFFERENTIAL/PLATELET
Absolute Monocytes: 547 cells/uL (ref 200–950)
Basophils Absolute: 19 cells/uL (ref 0–200)
Basophils Relative: 0.2 %
Eosinophils Absolute: 259 cells/uL (ref 15–500)
Eosinophils Relative: 2.7 %
HCT: 39.2 % (ref 38.5–50.0)
Hemoglobin: 12.8 g/dL — ABNORMAL LOW (ref 13.2–17.1)
Lymphs Abs: 2486 cells/uL (ref 850–3900)
MCH: 25.8 pg — ABNORMAL LOW (ref 27.0–33.0)
MCHC: 32.7 g/dL (ref 32.0–36.0)
MCV: 78.9 fL — ABNORMAL LOW (ref 80.0–100.0)
MPV: 9.4 fL (ref 7.5–12.5)
Monocytes Relative: 5.7 %
Neutro Abs: 6288 cells/uL (ref 1500–7800)
Neutrophils Relative %: 65.5 %
Platelets: 334 10*3/uL (ref 140–400)
RBC: 4.97 10*6/uL (ref 4.20–5.80)
RDW: 12.4 % (ref 11.0–15.0)
Total Lymphocyte: 25.9 %
WBC: 9.6 10*3/uL (ref 3.8–10.8)

## 2020-02-25 LAB — PSA: PSA: 0.33 ng/mL (ref <=4.0)

## 2020-03-09 ENCOUNTER — Ambulatory Visit (INDEPENDENT_AMBULATORY_CARE_PROVIDER_SITE_OTHER): Payer: BLUE CROSS/BLUE SHIELD | Admitting: Podiatry

## 2020-03-09 ENCOUNTER — Other Ambulatory Visit: Payer: Self-pay

## 2020-03-09 DIAGNOSIS — M14671 Charcot's joint, right ankle and foot: Secondary | ICD-10-CM | POA: Diagnosis not present

## 2020-03-09 DIAGNOSIS — L97411 Non-pressure chronic ulcer of right heel and midfoot limited to breakdown of skin: Secondary | ICD-10-CM

## 2020-03-09 DIAGNOSIS — E11621 Type 2 diabetes mellitus with foot ulcer: Secondary | ICD-10-CM | POA: Diagnosis not present

## 2020-03-09 NOTE — Progress Notes (Signed)
  Subjective:  Patient ID: [name], male    DOB: [birth year],  MRN: [id number]  No chief complaint on file.   DOS: 11/23/19 Procedure: Right foot saucerization of bone for osteomyelitis, insertion of abx beads, bone biopsy   50 y.o. male presents with the above complaint. History confirmed with patient. Thinks the wound is doing ok not much drainage or other concerns.  Objective:  Physical Exam: no tenderness at the surgical site, local edema noted and calf supple, nontender. Incision: Right foot with plantar subcuboid wound measuring 1x2x1 with macerated border, no warmth, erythema, signs of infection. Small amount of tunneling at 3'oclock without probe to bone.  Assessment:   1. Diabetic ulcer of right midfoot associated with type 2 diabetes mellitus, limited to breakdown of skin ([place])    Plan:  Patient was evaluated and treated and all questions answered.  Ulcer right foot -Wound similar in size to previous -Wound debrided and dressed with betadine peripherally and packed with prisma centrally. -Will check into coverage of graft for patient given failure to heal for several weeks.  No follow-ups on file.

## 2020-03-14 ENCOUNTER — Other Ambulatory Visit: Payer: Self-pay

## 2020-03-15 ENCOUNTER — Telehealth: Payer: Self-pay | Admitting: Podiatry

## 2020-03-15 NOTE — Telephone Encounter (Signed)
Patients wife called in wanting to reschedule appointment, at this time we have been asked to hold off on scheduling and will contact patient as soon as possible

## 2020-03-19 NOTE — Progress Notes (Signed)
  Subjective:  Patient ID: [name], male    DOB: 18-Jan-1971,  MRN: [id number]  Chief Complaint  Patient presents with  . Routine Post Op    POV #6 DOS 11/23/2019 RT FOOT WOUND DEBRIDMENT & BONE DEBRIDEMENT, POSSSIBLE INSERSION OF ANTIBIOTIC BEADS, BONE BIOPSY. Pt complains of soreness and blister.     DOS: 11/23/19 Procedure: Right foot saucerization of bone for osteomyelitis, insertion of abx beads, bone biopsy   50 y.o. male presents with the above complaint. History confirmed with patient.  Complains of blister to left great toe  Objective:  Physical Exam: no tenderness at the surgical site, local edema noted and calf supple, nontender. Incision: healing well, no significant drainage, no dehiscence, no significant erythema.  There is small residual open area with surrounding hyperkeratosis with approximately 0.5 cm probing without probe to bone Left foot epithelialized blister  Assessment:   1. Diabetic ulcer of other part of right foot associated with diabetes mellitus due to underlying condition, with necrosis of muscle ([place])   2. Post-operative state     Plan:  Patient was evaluated and treated and all questions answered.  Post-operative State -Wound continues to improve and worsen on and off.  There does appear to be some probing without probe to bone.  This was dressed with Prisma and DSD.  Blister to the left great toe is also noted which was debrided    No follow-ups on file.

## 2020-04-03 ENCOUNTER — Other Ambulatory Visit: Payer: Self-pay

## 2020-04-03 ENCOUNTER — Ambulatory Visit: Payer: BLUE CROSS/BLUE SHIELD | Admitting: Podiatry

## 2020-04-03 DIAGNOSIS — E11621 Type 2 diabetes mellitus with foot ulcer: Secondary | ICD-10-CM

## 2020-04-03 DIAGNOSIS — S91311A Laceration without foreign body, right foot, initial encounter: Secondary | ICD-10-CM

## 2020-04-03 DIAGNOSIS — L97411 Non-pressure chronic ulcer of right heel and midfoot limited to breakdown of skin: Secondary | ICD-10-CM | POA: Diagnosis not present

## 2020-04-03 NOTE — Progress Notes (Signed)
  Subjective:  Patient ID: [name], male    DOB: 1970-07-14,  MRN: [id number]  No chief complaint on file.  DOS: 11/23/19 Procedure: Right foot saucerization of bone for osteomyelitis, insertion of abx beads, bone biopsy   50 y.o. male presents with the above complaint. History confirmed with patient. Thinks the wound is doing well. New cut to the outside of the foot - states it is from a fall 3-4 days ago.  Objective:  Physical Exam: no tenderness at the surgical site, local edema noted and calf supple, nontender. Incision: Right foot with plantar subcuboid wound measuring 1x2x0.5 with macerated border, no warmth, erythema, signs of infection. Small amount of tunneling at 3'oclock without probe to bone.  New lateral foot 1cm split thickness laceration with active sanguinous bleeding no warmth/purulence/SOI.  Assessment:   1. Diabetic ulcer of right midfoot associated with type 2 diabetes mellitus, limited to breakdown of skin ([place])   2. Laceration of dorsum of foot, right, initial encounter    Plan:  Patient was evaluated and treated and all questions answered.  Ulcer right foot -Wound improved today with decreasing depth and less tunneling. -Dressed with mepilex border dressing and betadine d/t maceration -Patient to use silver alginate.  Laceration -New injury s/p fall. Packed with surgifoam for hemostasis. Bandage applied. This does not appear to be contiguous with the plantar ulcer.  Return in about 3 weeks (around 04/24/2020).

## 2020-04-11 ENCOUNTER — Other Ambulatory Visit: Payer: Self-pay

## 2020-04-11 MED ORDER — ONETOUCH ULTRASOFT LANCETS MISC: 12 refills | Status: DC

## 2020-04-17 ENCOUNTER — Other Ambulatory Visit: Payer: Self-pay

## 2020-04-17 ENCOUNTER — Ambulatory Visit (INDEPENDENT_AMBULATORY_CARE_PROVIDER_SITE_OTHER): Payer: BLUE CROSS/BLUE SHIELD

## 2020-04-17 DIAGNOSIS — Z23 Encounter for immunization: Secondary | ICD-10-CM | POA: Diagnosis not present

## 2020-04-24 ENCOUNTER — Other Ambulatory Visit: Payer: Self-pay

## 2020-04-24 ENCOUNTER — Ambulatory Visit: Payer: BLUE CROSS/BLUE SHIELD | Admitting: Podiatry

## 2020-04-24 DIAGNOSIS — L97411 Non-pressure chronic ulcer of right heel and midfoot limited to breakdown of skin: Secondary | ICD-10-CM

## 2020-04-24 DIAGNOSIS — E11621 Type 2 diabetes mellitus with foot ulcer: Secondary | ICD-10-CM | POA: Diagnosis not present

## 2020-04-26 ENCOUNTER — Telehealth: Payer: Self-pay | Admitting: Family Medicine

## 2020-04-26 DIAGNOSIS — Z1211 Encounter for screening for malignant neoplasm of colon: Secondary | ICD-10-CM

## 2020-04-26 NOTE — Telephone Encounter (Signed)
Reached out to pt again and was able to contact him and made him aware of the below.

## 2020-04-26 NOTE — Telephone Encounter (Signed)
Referring patient for colonoscopy since he recently turned 50-insurance has rejected prior to age 50.  Please let patient know he should get a call within 2 weeks and if not to reach out to [facility] contact Address: [address], [place], [place] [address] Phone: [phone]

## 2020-04-26 NOTE — Telephone Encounter (Signed)
Lm for pt tcb. 

## 2020-05-01 ENCOUNTER — Other Ambulatory Visit: Payer: Self-pay | Admitting: Family Medicine

## 2020-05-01 ENCOUNTER — Other Ambulatory Visit: Payer: Self-pay

## 2020-05-01 ENCOUNTER — Ambulatory Visit: Payer: BLUE CROSS/BLUE SHIELD | Admitting: Podiatry

## 2020-05-01 DIAGNOSIS — E11621 Type 2 diabetes mellitus with foot ulcer: Secondary | ICD-10-CM | POA: Diagnosis not present

## 2020-05-01 DIAGNOSIS — L97411 Non-pressure chronic ulcer of right heel and midfoot limited to breakdown of skin: Secondary | ICD-10-CM | POA: Diagnosis not present

## 2020-05-01 NOTE — Progress Notes (Signed)
  Subjective:  Patient ID: [name], male    DOB: [birth year],  MRN: [id number]  Chief Complaint  Patient presents with  . Wound Check    1 wk wound care: Some drainage white/yellow/red-some odor-denies f/c/n/v-some irritation w/ walking.    50 y.o. male presents for wound care. Hx confirmed with patient. Did get dressing wet and did not change it. Objective:  Physical Exam: Wound Location: right lateral plantar foot Wound Measurement: 1.7x0.5x2 Wound Base: Granular/Healthy Peri-wound: Macerated Exudate: Moderate amount Serosanguinous exudate wound without warmth, erythema, signs of acute infection Assessment:   1. Diabetic ulcer of right midfoot associated with type 2 diabetes mellitus, limited to breakdown of skin ([place])    Plan:  Patient was evaluated and treated and all questions answered.  Ulcer right foot -Ulcer cleansed and flushed -Puraply applied to the wound - packed into wound. Lot [id number], exp. 06/25/22. Covered with mepitel, 4x4, kerlix, Coban. Leave intact x1 week -Continue offloading boot.  No follow-ups on file.

## 2020-05-03 ENCOUNTER — Other Ambulatory Visit: Payer: Self-pay | Admitting: Family Medicine

## 2020-05-11 ENCOUNTER — Encounter: Payer: Self-pay | Admitting: Internal Medicine

## 2020-05-11 ENCOUNTER — Other Ambulatory Visit: Payer: Self-pay

## 2020-05-11 ENCOUNTER — Ambulatory Visit: Payer: BLUE CROSS/BLUE SHIELD | Admitting: Podiatry

## 2020-05-11 DIAGNOSIS — E11621 Type 2 diabetes mellitus with foot ulcer: Secondary | ICD-10-CM

## 2020-05-11 DIAGNOSIS — L97411 Non-pressure chronic ulcer of right heel and midfoot limited to breakdown of skin: Secondary | ICD-10-CM | POA: Diagnosis not present

## 2020-05-24 NOTE — Progress Notes (Signed)
  Subjective:  Patient ID: Giavonni Breigh Annett [name], male    DOB: [birth year],  MRN: [id number]  Chief Complaint  Patient presents with  . Diabetic Ulcer    Recheck diabetic ulcer of right midfoot. Pt states improvement but still having a lot of drainage.     50 y.o. male presents for wound care. Hx confirmed with patient.  States that he still having a lot of drainage but is reporting some improvement.  He tried to get used to the new inserts that he has. Objective:  Physical Exam: Wound Location: Right lateral foot Wound Measurement: 1.5*.05x1 Wound Base: Granular/Healthy Peri-wound: Macerated Exudate: Moderate amount Serosanguinous exudate wound without warmth, erythema, signs of acute infection Assessment:   1. Diabetic ulcer of right midfoot associated with type 2 diabetes mellitus, limited to breakdown of skin ([place])      Plan:  Patient was evaluated and treated and all questions answered.  Ulcer right lateral foot -Wound thoroughly cleansed.  Minimal debridement -Puraply Lot [id number] exp 06/24/20 packed into the wound.  Cover with Mepilex, 4 x 4 ABD Kerlix Ace bandage -Follow-up in 1 week for recheck   No follow-ups on file.

## 2020-05-24 NOTE — Progress Notes (Signed)
  Subjective:  Patient ID: [name], male    DOB: 09/19/1970,  MRN: [id number]  Chief Complaint  Patient presents with  . Diabetic Ulcer    1 week follow up   50 y.o. male presents for wound care. Hx confirmed with patient. Wound is doing ok denies new issues. Objective:  Physical Exam: Wound Location: right lateral plantar foot Wound Measurement: 2x0.5 Wound Base: Granular/Healthy Peri-wound: Macerated Exudate: Moderate amount Serosanguinous exudate wound without warmth, erythema, signs of acute infection Assessment:   1. Diabetic ulcer of right midfoot associated with type 2 diabetes mellitus, limited to breakdown of skin ([place])    Plan:  Patient was evaluated and treated and all questions answered.  Ulcer right foot -Ulcer cleansed and flushed -Hold off graft due to malodor. Dressed with maxorb and DSD.  Return in about 2 weeks (around 05/25/2020) for Wound Care.

## 2020-05-25 ENCOUNTER — Other Ambulatory Visit: Payer: Self-pay

## 2020-05-25 ENCOUNTER — Ambulatory Visit: Payer: BLUE CROSS/BLUE SHIELD | Admitting: Podiatry

## 2020-05-25 DIAGNOSIS — E08621 Diabetes mellitus due to underlying condition with foot ulcer: Secondary | ICD-10-CM | POA: Diagnosis not present

## 2020-05-25 DIAGNOSIS — E11621 Type 2 diabetes mellitus with foot ulcer: Secondary | ICD-10-CM | POA: Diagnosis not present

## 2020-05-25 DIAGNOSIS — M14671 Charcot's joint, right ankle and foot: Secondary | ICD-10-CM

## 2020-05-25 DIAGNOSIS — L97513 Non-pressure chronic ulcer of other part of right foot with necrosis of muscle: Secondary | ICD-10-CM

## 2020-05-25 DIAGNOSIS — L97411 Non-pressure chronic ulcer of right heel and midfoot limited to breakdown of skin: Secondary | ICD-10-CM | POA: Diagnosis not present

## 2020-05-25 NOTE — Progress Notes (Signed)
  Subjective:  Patient ID: Vadhir Laylana Gerwig [name], male    DOB: [birth year],  MRN: [id number]  Chief Complaint  Patient presents with  . Wound Check    Pt denies fever/chills/nausea/vomiting. Pt states there has been drainage.   50 y.o. male presents for wound care. Hx confirmed with patient. Did develop a blister to the left great toe, thought to be from his boots. Objective:  Physical Exam: Wound Location: right lateral plantar foot Wound Measurement: 2*0.5*5 Wound Base: Granular/Healthy Peri-wound: Macerated Exudate: Moderate amount Serosanguinous exudate wound without warmth, erythema, signs of acute infection Assessment:   1. Diabetic ulcer of right midfoot associated with type 2 diabetes mellitus, limited to breakdown of skin ([facility])   2. Charcot's joint of foot, right   3. Diabetic ulcer of toe of right foot associated with diabetes mellitus due to underlying condition, with necrosis of muscle ([place])    Plan:  Patient was evaluated and treated and all questions answered.  Ulcer right foot -Ulcer cleansed and flushed. He does appear to have had more drainage but the foot is well appearing. Abx not indicated at this time. -Dressed with maxorb and DSD today. Pt to pull tomorrow. Periphery of wound dressed with betadine, 4x4, kerlix, ACE bandage -Will hold off graft today. -We did discuss options about debridement of the wound with possible skin graft application or wound VAC application. Will discuss further next visit.  Left great toe blister -Dressed with betadine and DSD -Monitor closely for SOI  No follow-ups on file.

## 2020-06-01 ENCOUNTER — Other Ambulatory Visit: Payer: Self-pay | Admitting: Family Medicine

## 2020-06-01 ENCOUNTER — Other Ambulatory Visit: Payer: Self-pay

## 2020-06-01 ENCOUNTER — Ambulatory Visit: Payer: BLUE CROSS/BLUE SHIELD | Admitting: Podiatry

## 2020-06-01 DIAGNOSIS — M14671 Charcot's joint, right ankle and foot: Secondary | ICD-10-CM | POA: Diagnosis not present

## 2020-06-01 DIAGNOSIS — E11621 Type 2 diabetes mellitus with foot ulcer: Secondary | ICD-10-CM | POA: Diagnosis not present

## 2020-06-01 DIAGNOSIS — L97411 Non-pressure chronic ulcer of right heel and midfoot limited to breakdown of skin: Secondary | ICD-10-CM | POA: Diagnosis not present

## 2020-06-01 NOTE — Progress Notes (Signed)
  Subjective:  Patient ID: [name], male    DOB: 03-07-1970,  MRN: [id number]  Chief Complaint  Patient presents with  . Toe Pain    Left 1st toe throbbing pain at night  . Wound Check    Right foot wound check. Denies fever/nausea/vomiting/chills. No new concerns.   50 y.o. male presents for wound care. Hx confirmed with patient. Thinks the wound is not draining as much and is doing better. Objective:  Physical Exam: Wound Location: right lateral plantar foot Wound Measurement: 2*0.5*5 Wound Base: Granular/Healthy Peri-wound: Macerated Exudate: Moderate amount Serosanguinous exudate wound without warmth, erythema, signs of acute infection Assessment:   1. Diabetic ulcer of right midfoot associated with type 2 diabetes mellitus, limited to breakdown of skin ([place])   2. Charcot's joint of foot, right    Plan:  Patient was evaluated and treated and all questions answered.  Ulcer right foot -Ulcer cleansed and flushed. Minimally debrided today. -Dressed with maxorb and DSD today. Pt to pull tomorrow. Dressed with DSD. -Again hold off graft today. -Consider debridement of the wound with possible skin graft application or wound VAC application in future  Right great toe blister -Improving. D/c betadine  No follow-ups on file.

## 2020-06-08 ENCOUNTER — Other Ambulatory Visit: Payer: Self-pay

## 2020-06-08 ENCOUNTER — Ambulatory Visit: Payer: BLUE CROSS/BLUE SHIELD | Admitting: Podiatry

## 2020-06-08 DIAGNOSIS — L97411 Non-pressure chronic ulcer of right heel and midfoot limited to breakdown of skin: Secondary | ICD-10-CM

## 2020-06-08 DIAGNOSIS — E11621 Type 2 diabetes mellitus with foot ulcer: Secondary | ICD-10-CM

## 2020-06-08 NOTE — Progress Notes (Signed)
  Subjective:  Patient ID: [name], male    DOB: [birth year],  MRN: [id number]  Chief Complaint  Patient presents with  . Wound Check    Wound check: Pt states right 1st digit has drainage, as well as right foot lateral aspect. Pt does state decrease in pain left 1st digit.   50 y.o. male presents for wound care. Hx confirmed with patient. Objective:  Physical Exam: Wound Location: right lateral plantar foot Wound Measurement: 2x1x2 Wound Base: Granular/Healthy Peri-wound: Macerated Exudate: Moderate amount Serosanguinous exudate wound without warmth, erythema, signs of acute infection Assessment:   1. Diabetic ulcer of right midfoot associated with type 2 diabetes mellitus, limited to breakdown of skin ([place])    Plan:  Patient was evaluated and treated and all questions answered.  Ulcer right foot -Cleansed and debrided. -Puraply [id number] exp 07/17/22 packed into wound. Secondary dressing of mepitel, 4x4, kerlix, ACE bandage.  Right great toe blister -Debridement today including removal of toenail. Dressed with silvadene, 4x4, Coban -Pt to apply silvadene and bandage daily.   Return in about 2 weeks (around 06/22/2020).

## 2020-06-22 ENCOUNTER — Ambulatory Visit: Payer: BLUE CROSS/BLUE SHIELD | Admitting: Podiatry

## 2020-06-22 ENCOUNTER — Other Ambulatory Visit: Payer: Self-pay

## 2020-06-22 DIAGNOSIS — L97411 Non-pressure chronic ulcer of right heel and midfoot limited to breakdown of skin: Secondary | ICD-10-CM | POA: Diagnosis not present

## 2020-06-22 DIAGNOSIS — E11621 Type 2 diabetes mellitus with foot ulcer: Secondary | ICD-10-CM | POA: Diagnosis not present

## 2020-06-25 ENCOUNTER — Other Ambulatory Visit: Payer: Self-pay

## 2020-06-25 ENCOUNTER — Encounter: Payer: Self-pay | Admitting: Family Medicine

## 2020-06-25 ENCOUNTER — Ambulatory Visit (INDEPENDENT_AMBULATORY_CARE_PROVIDER_SITE_OTHER): Payer: BLUE CROSS/BLUE SHIELD | Admitting: Family Medicine

## 2020-06-25 VITALS — BP 136/88 | HR 77 | Temp 98.8°F | Ht 72.0 in | Wt 316.4 lb

## 2020-06-25 DIAGNOSIS — E1159 Type 2 diabetes mellitus with other circulatory complications: Secondary | ICD-10-CM

## 2020-06-25 DIAGNOSIS — E1169 Type 2 diabetes mellitus with other specified complication: Secondary | ICD-10-CM

## 2020-06-25 DIAGNOSIS — E11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ical complication: Secondary | ICD-10-CM | POA: Diagnosis not present

## 2020-06-25 DIAGNOSIS — E785 Hyperlipidemia, unspecified: Secondary | ICD-10-CM

## 2020-06-25 DIAGNOSIS — I152 Hypertension secondary to endocrine disorders: Secondary | ICD-10-CM

## 2020-06-25 DIAGNOSIS — Z23 Encounter for immunization: Secondary | ICD-10-CM | POA: Diagnosis not present

## 2020-06-25 DIAGNOSIS — E11319 Type 2 diabetes mellitus with unspecified diabetic retinopathy without macular edema: Secondary | ICD-10-CM

## 2020-06-25 NOTE — Progress Notes (Signed)
Phone [phone] In person visit   Subjective:   [name] is a 50 y.o. year old very pleasant male patient who presents for/with See problem oriented charting Chief Complaint  Patient presents with  . Diabetes   This visit occurred during the SARS-CoV-2 public health emergency.  Safety protocols were in place, including screening questions prior to the visit, additional usage of staff PPE, and extensive cleaning of exam room while observing appropriate contact time as indicated for disinfecting solutions.   Past Medical History-  Patient Active Problem List   Diagnosis Date Noted  . Morbid obesity ([place]) 03/08/2015    Priority: High  . Type II diabetes mellitus with neurological manifestations ([place]) 03/07/2015    Priority: High  . Diabetic retinopathy ([place]) 01/25/2018    Priority: Medium  . Chronic kidney disease (CKD), stage III (moderate) ([place]) 11/17/2016    Priority: Medium  . Diabetic neuropathy ([place]) 11/17/2016    Priority: Medium  . Elevated LFTs     Priority: Medium  . Hyperlipidemia associated with type 2 diabetes mellitus ([place]) 03/08/2015    Priority: Medium  . Hypertension associated with diabetes ([place]) 03/08/2015    Priority: Medium  . Status post split thickness skin graft 12/08/2016    Priority: Low  . B12 deficiency 11/17/2016    Priority: Low  . Iron deficiency anemia 11/17/2016    Priority: Low  . 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 Occlusion of peripherally inserted central catheter (PICC) line ([place]) 01/10/2020  . Diabetic foot ulcer ([place]) 11/18/2019  . Charcot's joint of foot, right 11/18/2019  . Pyogenic inflammation of bone ([place]) 11/09/2019  . Osteomyelitis of ankle or foot, acute, right ([place]) 11/09/2019    Medications- reviewed and updated Current Outpatient  Medications  Medication Sig Dispense Refill  . ascorbic acid (VITAMIN C) 500 MG tablet Take by mouth daily.     Marland Kitchen aspirin EC 81 MG tablet Take 81 mg by mouth daily.    Marland Kitchen atorvastatin (LIPITOR) 40 MG tablet TAKE 1 AND 1/2 TABLETS BY MOUTH EVERY MORNING 135 tablet 0  . Cyanocobalamin (VITAMIN B-12) 5000 MCG SUBL Place 5,000 mcg under the tongue daily.    . fenofibrate (TRICOR) 48 MG tablet TAKE 1 TABLET(48 MG) BY MOUTH DAILY 90 tablet 0  . ferrous sulfate 325 (65 FE) MG tablet Take 1 tablet (325 mg total) by mouth 2 (two) times daily with a meal. (Patient taking differently: Take 325 mg by mouth 2 (two) times daily with a meal.) 60 tablet 3  . glucose blood (ACCU-CHEK GUIDE) test strip Dx E11.49 - Use to check blood glucose three times daily or as directed. 300 each 1  . hydrochlorothiazide (MICROZIDE) 12.5 MG capsule TAKE 1 CAPSULE BY MOUTH DAILY IN THE MORNING 30 capsule 1  . insulin aspart (NOVOLOG) 100 UNIT/ML injection INJECT 15 TO 25 UNITS UNDER THE SKIN TWICE DAILY 50 mL 3  . Insulin Degludec (TRESIBA) 100 UNIT/ML SOLN INJECT 64 UNITS UNDER THE SKIN AT BEDTIME 40 mL 0  . Insulin Syringe-Needle U-100 (BD INSULIN SYRINGE U/F) 31G X 5/16" 0.5 ML MISC Use to inject insulin 4 times daily 100 each 4  . Insulin Syringe-Needle U-100 (BD INSULIN SYRINGE U/F) 31G X 5/16" 1 ML MISC Use to inject tresiba/long acting insulin once daily  100 each 3  . Insulin Syringe-Needle U-100 (BD VEO INSULIN SYR U/F 1/2UNIT) 31G X 15/64" 0.3 ML MISC USE TO INJECT INSULIN FOUR TIMES DAILY AS DIRECTED 300 each 2  . JANUVIA 50 MG tablet TAKE 1 TABLET BY MOUTH EVERY DAY IN THE MORNING 90 tablet 1  . Lancets (ONETOUCH ULTRASOFT) lancets Use to test blood sugars daily. Dx: E11.9 100 each 12  . Lancets MISC USED TO CHECK BLOOD GLUCOSE 3-4 TIMES DAILY 200 each 3  . Multiple Vitamin (MULTIVITAMIN WITH MINERALS) TABS tablet Take 1 tablet by mouth 2 (two) times daily.    . Omega-3 Fatty Acids (FISH OIL) 1360 MG CAPS Take 1,360 mg  by mouth daily.     Marland Kitchen pyridoxine (B-6) 100 MG tablet Take 100 mg by mouth 2 (two) times daily.    . ramipril (ALTACE) 5 MG capsule TAKE 1 CAPSULE(5 MG) BY MOUTH EVERY MORNING 90 capsule 0   No current facility-administered medications for this visit.     Objective:  BP 136/88   Pulse 77   Temp 98.8 F (37.1 C) (Temporal)   Ht 6' (1.829 m)   Wt (!) 316 lb 6.4 oz (143.5 kg)   SpO2 99%   BMI 42.91 kg/m  Gen: NAD, resting comfortably CV: RRR no murmurs rubs or gallops Lungs: CTAB no crackles, wheeze, rhonchi Abdomen: soft/nontender/nondistended/normal bowel sounds. No rebound or guarding.  Ext:+1 edema on the right and 1-2+ edema on the left   Skin: warm, dry     Assessment and Plan  #Social Update: Work keeps him busy-not a lot of summer plans otherwise. He enjoys drawing during his spare time. His daughter has art camp coming up for the summer.  Thankfully having sister-in-law in the home has not been very stressful for him  #Diabetes  S:NovoLog 15 to 20 units twice daily(20 lately). Once in the morning and one at night. Also takes [place] at night up to 78 units lately.  Patient reports has had night sweats for years- this has improved with baseline blood sugar increasing to 70s with decreasing [place] but still notes night sweats are worse if blood sugar is in the 70s.  Almost no issues if blood sugars in the 100s range-once again overall symptoms are improving Take Januvia 50 mg in the morning.  CBGs-Averages over the 90 days 110 , low 71, high 202  Exercise and diet-Some dietary indiscretion due to birthdays in his household.  Lab Results  Component Value Date   HGBA1C 7.3 (H) 02/24/2020   HGBA1C 6.7 (H) 11/09/2019   HGBA1C 5.5 06/14/2019  A/P:  hopefully stable- update  today. Continue current meds as long a A1c is stable.  If A1c is below 7 we discussed decreasing Tresiba to 76 units most likely due to reported night sweats when blood sugars get into the 70s- no other B  symptoms such as unusual fatigue or fevers or unintentional weight loss.  We will keep Januvia and NovoLog the same-if A1c above 7.5 potentially add a lunch dose of NovoLog -I think we need extensive testing for night sweats at this time  -Patient with diabetic retinopathy-reports having this monitored regularly-we are going to try to get a copy of recent exam.  Suspect stable  #B12 deficiency/anemia S Current treatment/medication(oral vs IM): Cyanocobalamin 5000 mcg daily. Also takes ferrous sulfate supplements twice daily.  A/P:  Stable. Continue current medications.  Consider checking ferritin levels next physical  # Hyperlipidemia S:Medication: Atorvastatin 60 mg, fenofibrate-does get some myalgias  so we have been hesitant to increase dose better [place] Lab Results  Component Value Date   CHOL 189 02/24/2020   HDL 38 (L) 02/24/2020   LDLCALC 131 (H) 02/24/2020   LDLDIRECT 61.0 06/14/2019   TRIG 102 02/24/2020   CHOLHDL 5.0 (H) 02/24/2020  A/P: Poor control last visit-discussed working on lifestyle that time- there been a string of birthdays recently and patient has had some dietary indiscretions and weight up slightly- we discussed reversing this trend.  For now continue current medication and recheck at physical  #Hypertension/CKD stage III  S: HCTZ 12.5 mg and Ramipril 5 mg  Home reading #s: Does not check  BP Readings from Last 3 Encounters:  06/25/20 136/88  02/24/20 124/76  01/27/20 (!) 161/89  A/P:  Stable. Continue current medications.   Recommended follow up: Return in about 4 months (around 10/25/2020) for follow up- or sooner if needed. Future Appointments  Date Time Provider [place]  06/29/2020 11:30 AM [name], DPM [facility]  08/20/2020  4:30 PM [facility] PREVISIT RM 50 [facility]  09/03/2020  9:30 AM Gatha Mayer, MD [facility]  10/31/2020  8:40 AM Yong Channel Brayton Mars, MD [facility]    Lab/Order associations:    ICD-10-CM   1. Type II diabetes mellitus with neurological manifestations (HCC)  E11.49 CBC with Differential/Platelet    Comprehensive metabolic panel    Hemoglobin A1c  2. Immunization due  Z23 Varicella-zoster vaccine IM (Shingrix)    No orders of the defined types were placed in this encounter.    I,Alexis Bryant,acting as a Education administrator for [name], MD.,have documented all relevant documentation on the behalf of [name], MD,as directed by  [name], MD while in the presence of [name], MD.   I, [name], MD, have reviewed all documentation for this visit. The documentation on 06/25/20 for the exam, diagnosis, procedures, and orders are all accurate and complete.    Return precautions advised.  [name], MD

## 2020-06-25 NOTE — Patient Instructions (Addendum)
Health Maintenance Due  Topic Date Due  . Prevnar 20 in the future Never done  . COLONOSCOPY (Pts 45-40yrs Insurance coverage will need to be confirmed) Scheduled.  Never done  . OPHTHALMOLOGY EXAM Will signe a release form at checkout.  06/11/2019  . Zoster Vaccines- Shingrix (2 of 2) Done today in office.  06/12/2020   Try 76 units tresiba if a1c under 7- to see if that helps with night sweats  Recommended follow up: Return in about 4 months (around 10/25/2020) for follow up- or sooner if needed.

## 2020-06-26 ENCOUNTER — Encounter: Payer: Self-pay | Admitting: Podiatry

## 2020-06-26 LAB — COMPREHENSIVE METABOLIC PANEL
ALT: 40 U/L (ref 0–53)
AST: 35 U/L (ref 0–37)
Albumin: 3.9 g/dL (ref 3.5–5.2)
Alkaline Phosphatase: 67 U/L (ref 39–117)
BUN: 27 mg/dL — ABNORMAL HIGH (ref 6–23)
CO2: 26 mEq/L (ref 19–32)
Calcium: 9.8 mg/dL (ref 8.4–10.5)
Chloride: 102 mEq/L (ref 96–112)
Creatinine, Ser: 1.47 mg/dL (ref 0.40–1.50)
GFR: 55.39 mL/min — ABNORMAL LOW (ref >60.00)
Glucose, Bld: 75 mg/dL (ref 70–99)
Potassium: 3.9 mEq/L (ref 3.5–5.1)
Sodium: 137 mEq/L (ref 135–145)
Total Bilirubin: 0.8 mg/dL (ref 0.2–1.2)
Total Protein: 8.1 g/dL (ref 6.0–8.3)

## 2020-06-26 LAB — CBC WITH DIFFERENTIAL/PLATELET
Basophils Absolute: 0.1 10*3/uL (ref 0.0–0.1)
Basophils Relative: 0.6 % (ref 0.0–3.0)
Eosinophils Absolute: 0.3 10*3/uL (ref 0.0–0.7)
Eosinophils Relative: 3 % (ref 0.0–5.0)
HCT: 37.3 % — ABNORMAL LOW (ref 39.0–52.0)
Hemoglobin: 12.2 g/dL — ABNORMAL LOW (ref 13.0–17.0)
Lymphocytes Relative: 28.6 % (ref 12.0–46.0)
Lymphs Abs: 2.7 10*3/uL (ref 0.7–4.0)
MCHC: 32.7 g/dL (ref 30.0–36.0)
MCV: 78.3 fl (ref 78.0–100.0)
Monocytes Absolute: 0.6 10*3/uL (ref 0.1–1.0)
Monocytes Relative: 6.2 % (ref 3.0–12.0)
Neutro Abs: 5.8 10*3/uL (ref 1.4–7.7)
Neutrophils Relative %: 61.6 % (ref 43.0–77.0)
Platelets: 336 10*3/uL (ref 150.0–400.0)
RBC: 4.77 Mil/uL (ref 4.22–5.81)
RDW: 13.9 % (ref 11.5–15.5)
WBC: 9.5 10*3/uL (ref 4.0–10.5)

## 2020-06-26 LAB — HEMOGLOBIN A1C: Hgb A1c MFr Bld: 6.9 % — ABNORMAL HIGH (ref 4.6–6.5)

## 2020-06-26 NOTE — Progress Notes (Signed)
  Subjective:  Patient ID: [name], male    DOB: 1970/08/27,  MRN: [id number]  Chief Complaint  Patient presents with  . Wound Check    Wound    50 y.o. male presents for wound care. Hx confirmed with patient. Objective:  Physical Exam: Wound Location: right lateral plantar foot Wound Measurement: 1.5 cm x 0.9 cm x 1.5 cm Wound Base: Granular/Healthy Peri-wound: Macerated Exudate: Moderate amount Serosanguinous exudate wound without warmth, erythema, signs of acute infection Assessment:   1. Diabetic ulcer of right midfoot associated with type 2 diabetes mellitus, limited to breakdown of skin ([place])    Plan:  Patient was evaluated and treated and all questions answered.  Ulcer right foot -Cleansed and debrided. -Puraply [id number] exp 07/17/22 packed into wound x2. Secondary dressing of mepitel, 4x4, kerlix, ACE bandage.  There is more tunneling present.  I believe patient will benefit from 2 PuraPly graft.  Patient states understanding. Right great toe blister -He will follow-up with Dr. March Rummage for further application.  No follow-ups on file.

## 2020-06-27 ENCOUNTER — Other Ambulatory Visit: Payer: Self-pay | Admitting: Family Medicine

## 2020-06-29 ENCOUNTER — Ambulatory Visit: Payer: BLUE CROSS/BLUE SHIELD | Admitting: Podiatry

## 2020-06-29 ENCOUNTER — Other Ambulatory Visit: Payer: Self-pay

## 2020-06-29 DIAGNOSIS — E11621 Type 2 diabetes mellitus with foot ulcer: Secondary | ICD-10-CM | POA: Diagnosis not present

## 2020-06-29 DIAGNOSIS — L97411 Non-pressure chronic ulcer of right heel and midfoot limited to breakdown of skin: Secondary | ICD-10-CM | POA: Diagnosis not present

## 2020-07-01 ENCOUNTER — Other Ambulatory Visit: Payer: Self-pay | Admitting: Family Medicine

## 2020-07-02 ENCOUNTER — Other Ambulatory Visit: Payer: Self-pay | Admitting: Family Medicine

## 2020-07-11 ENCOUNTER — Other Ambulatory Visit: Payer: Self-pay | Admitting: Family Medicine

## 2020-07-13 ENCOUNTER — Other Ambulatory Visit: Payer: Self-pay

## 2020-07-13 ENCOUNTER — Ambulatory Visit: Payer: BLUE CROSS/BLUE SHIELD | Admitting: Podiatry

## 2020-07-13 DIAGNOSIS — L97411 Non-pressure chronic ulcer of right heel and midfoot limited to breakdown of skin: Secondary | ICD-10-CM

## 2020-07-13 DIAGNOSIS — E11621 Type 2 diabetes mellitus with foot ulcer: Secondary | ICD-10-CM

## 2020-07-14 NOTE — Progress Notes (Signed)
  Subjective:  Patient ID: [name], male    DOB: 18-Nov-1970,  MRN: [id number]  Chief Complaint  Patient presents with   Wound Check    Denies fever/nausea/vomiting/chills, no new concerns.   50 y.o. male presents for wound care. Hx confirmed with patient. States the wound is draining less. Objective:  Physical Exam: Wound Location: right lateral plantar foot Wound Measurement: 2*0.5*1.5 Wound Base: Granular/Healthy Peri-wound: Macerated Exudate: Moderate amount Serosanguinous exudate wound without warmth, erythema, signs of acute infection Assessment:   1. Diabetic ulcer of right midfoot associated with type 2 diabetes mellitus, limited to breakdown of skin ([place])    Plan:  Patient was evaluated and treated and all questions answered.  Ulcer right foot -Wound appears slightly worsened. -He does have some probe to bone. Hope for quick coverage otherwise will have to consider surgical intervention including debridement of the bone. Will consider more next visit. -Wound cleansed. Puraply graft [id number] applied exp 07/23/22. This was packed into the wound. Secondary dressing of mepitel, 4x4, ABD, Kerlix, ACE bandage applied.  Return in about 1 week (around 07/20/2020) for Wound Care.

## 2020-07-20 ENCOUNTER — Ambulatory Visit: Payer: BLUE CROSS/BLUE SHIELD | Admitting: Podiatry

## 2020-07-20 ENCOUNTER — Other Ambulatory Visit: Payer: Self-pay

## 2020-07-20 DIAGNOSIS — M14671 Charcot's joint, right ankle and foot: Secondary | ICD-10-CM | POA: Diagnosis not present

## 2020-07-20 DIAGNOSIS — L97411 Non-pressure chronic ulcer of right heel and midfoot limited to breakdown of skin: Secondary | ICD-10-CM | POA: Diagnosis not present

## 2020-07-20 DIAGNOSIS — E11621 Type 2 diabetes mellitus with foot ulcer: Secondary | ICD-10-CM | POA: Diagnosis not present

## 2020-07-20 NOTE — Progress Notes (Signed)
  Subjective:  Patient ID: [name], male    DOB: [birth year],  MRN: [id number]  Chief Complaint  Patient presents with   Wound Check    Denies fever/chills/nausea/vomiting. Pt has no new concerns at this time.   50 y.o. male presents for wound care. Hx confirmed with patient.  Left dressing intact as directed denies new concerns denies nausea vomiting fever chills Objective:  Physical Exam: Wound Location: right lateral plantar foot Wound Measurement: 1.5x1x1.5 Wound Base: Granular/Healthy Peri-wound: Macerated Exudate: Moderate amount Serosanguinous exudate wound without warmth, erythema, signs of acute infection Assessment:   1. Diabetic ulcer of right midfoot associated with type 2 diabetes mellitus, limited to breakdown of skin ([place])   2. Charcot's joint of foot, right     Plan:  Patient was evaluated and treated and all questions answered.  Ulcer right foot -Appears similar in size to last visit.  Drainage or malodor noted today no graft application indicated today.  We did discuss that I think he should benefit from debridement of the wound possibly including debridement of the bone.  I am concerned about the possibility of this bone becoming infected.  Does not appear that he has infection at present.  His foot type foot with amputation makes offloading this area very difficult.  The wound was packed with Maxorb today and dressed with dry sterile dressing patient can pull this packing tomorrow and continue to change daily.  We will discuss scheduling debridement procedure at next visit  No follow-ups on file.

## 2020-07-20 NOTE — Progress Notes (Addendum)
  Subjective:  Patient ID: [name], male    DOB: 1970/03/20,  MRN: [id number]  No chief complaint on file.  50 y.o. male presents for wound care. Hx confirmed with patient.  Denies new concerns denies nausea vomiting fever chills Objective:  Physical Exam: Wound Location: right lateral plantar foot Wound Measurement: 2.1x1.5 Wound Base: Granular/Healthy Peri-wound: Macerated Exudate: Moderate amount Serosanguinous exudate wound without warmth, erythema, signs of acute infection Assessment:   1. Diabetic ulcer of right midfoot associated with type 2 diabetes mellitus, limited to breakdown of skin ([place])     Plan:  Patient was evaluated and treated and all questions answered.  Ulcer right foot -Wound worsened today.  -Wound thoroughly cleansed and irrigated -Two grafts applied today -PuraPly grafts lot [id number] (3.02cm2) exp 9/29/24and [id number] (3.76 cm2) exp 07/23/22. Total of 25 graft units. -Secondary dressing of Mepitel, 4 x 4, ABD, Kerlix, Ace bandage -Follow-up in 1 week for recheck  No follow-ups on file.

## 2020-07-25 ENCOUNTER — Telehealth: Payer: Self-pay | Admitting: Family Medicine

## 2020-07-25 ENCOUNTER — Other Ambulatory Visit: Payer: Self-pay

## 2020-07-25 NOTE — Telephone Encounter (Signed)
Wife called in stating that they are going out on vacation Saturday and [facility] will not have enough supply to last while on vacation. Requesting a 90 day supply.

## 2020-07-25 NOTE — Telephone Encounter (Signed)
Patient medication has been refilled and sent into his local pharmacy.

## 2020-07-25 NOTE — Telephone Encounter (Signed)
Wife called back stating that she needs the [place] medication and she would like that in a 90 day supply sent to the [id number] [place], [place]

## 2020-07-26 MED ORDER — TRESIBA 100 UNIT/ML ~~LOC~~ SOLN: SUBCUTANEOUS | 1 refills | Status: DC

## 2020-07-26 NOTE — Telephone Encounter (Signed)
Rx sent to pharmacy   

## 2020-07-27 ENCOUNTER — Ambulatory Visit: Payer: BLUE CROSS/BLUE SHIELD | Admitting: Podiatry

## 2020-07-27 ENCOUNTER — Other Ambulatory Visit: Payer: Self-pay

## 2020-07-27 DIAGNOSIS — E11621 Type 2 diabetes mellitus with foot ulcer: Secondary | ICD-10-CM

## 2020-07-27 DIAGNOSIS — L97411 Non-pressure chronic ulcer of right heel and midfoot limited to breakdown of skin: Secondary | ICD-10-CM | POA: Diagnosis not present

## 2020-07-27 NOTE — Progress Notes (Signed)
  Subjective:  Patient ID: [name], male    DOB: 12/25/1970,  MRN: [id number]  Chief Complaint  Patient presents with   Wound Check    Denies fever/chills/nausea/vomiting. No new concerns.   50 y.o. male presents for wound care. Hx confirmed with patient.    Objective:  Physical Exam: Wound Location: right lateral plantar foot Wound Measurement: 2*1.5*1.5 Wound Base: Granular/Healthy Peri-wound: Macerated Exudate: Moderate amount Serosanguinous exudate wound without warmth, erythema, signs of acute infection Assessment:   1. Diabetic ulcer of right midfoot associated with type 2 diabetes mellitus, limited to breakdown of skin ([name]    Plan:  Patient was evaluated and treated and all questions answered.  Ulcer right foot -Similar in size to last visit. No probe to bone noted -Wound cleansed and minimally debrided. -Two puraply grafts applied and packed into wound - [id number] exp 09/27/22 and [id number] exp 07/17/22. Secondary dressing of mepitel, 4x4, kerlix, Coban. Leave intact x 1 week.  No follow-ups on file.

## 2020-08-03 ENCOUNTER — Ambulatory Visit: Payer: BLUE CROSS/BLUE SHIELD | Admitting: Podiatry

## 2020-08-03 ENCOUNTER — Other Ambulatory Visit: Payer: Self-pay

## 2020-08-03 DIAGNOSIS — L97411 Non-pressure chronic ulcer of right heel and midfoot limited to breakdown of skin: Secondary | ICD-10-CM

## 2020-08-03 DIAGNOSIS — E11621 Type 2 diabetes mellitus with foot ulcer: Secondary | ICD-10-CM

## 2020-08-03 NOTE — Progress Notes (Signed)
  Subjective:  Patient ID: [name], male    DOB: 10-22-1970,  MRN: [id number]  No chief complaint on file.  50 y.o. male presents for wound care. Hx confirmed with patient.  States he has changed the dressing daily to prevent too much moisture accumulating. Objective:  Physical Exam: Wound Location: right lateral plantar foot Wound Measurement: 2*0.5*1 Wound Base: Granular/Healthy Peri-wound: Macerated Exudate: Moderate amount Serosanguinous exudate wound without warmth, erythema, signs of acute infection Assessment:   1. Diabetic ulcer of right midfoot associated with type 2 diabetes mellitus, limited to breakdown of skin ([place])    Plan:  Patient was evaluated and treated and all questions answered.  Ulcer right foot -Improved today -Puraply x2 grafts size 3.76cm applied to the wound - one packed deep, one more superficial. Lot [id number] exp 09/08/22 for both. Secondary dressing of mepitel, 4x4, cast padding.  -Leave intact x1 week. F/u for recheck. No follow-ups on file.

## 2020-08-05 ENCOUNTER — Other Ambulatory Visit: Payer: Self-pay | Admitting: Family Medicine

## 2020-08-10 ENCOUNTER — Encounter: Payer: BLUE CROSS/BLUE SHIELD | Admitting: Internal Medicine

## 2020-08-17 ENCOUNTER — Other Ambulatory Visit: Payer: Self-pay

## 2020-08-17 ENCOUNTER — Ambulatory Visit: Payer: BLUE CROSS/BLUE SHIELD | Admitting: Podiatry

## 2020-08-17 DIAGNOSIS — L97513 Non-pressure chronic ulcer of other part of right foot with necrosis of muscle: Secondary | ICD-10-CM

## 2020-08-17 DIAGNOSIS — E08621 Diabetes mellitus due to underlying condition with foot ulcer: Secondary | ICD-10-CM

## 2020-08-17 NOTE — Progress Notes (Signed)
  Subjective:  Patient ID: [name], male    DOB: 1970/10/25,  MRN: [id number]  Chief Complaint  Patient presents with   Wound Check    Pt states new blister on right hallux. Denies fever/chills/nausea/vomiting.   50 y.o. male presents for wound care. Hx confirmed with patient. Continues to improve. Objective:  Physical Exam: Wound Location: right lateral plantar foot Wound Measurement: 2*0.5*1 Wound Base: Granular/Healthy Peri-wound: Macerated Exudate: Moderate amount Serosanguinous exudate wound without warmth, erythema, signs of acute infection Assessment:   1. Diabetic ulcer of toe of right foot associated with diabetes mellitus due to underlying condition, with necrosis of muscle ([place])    Plan:  Patient was evaluated and treated and all questions answered.  Ulcer right foot -Looks similar to last visit -Wound cleansed and debrided -Skin graft substitute applied to promote healing  Procedure: Application Skin Graft Substitute Rationale: Wound in need of advanced wound therapy to accelerate healing Pre-Debridement Wound Measurements: 2 cm x 0.5 cm x 1.5 cm  Post-Debridement Wound Measurements: same as pre-debridement. Type of Debridement: Selective Tissue Removed: Devitalized soft-tissue Instrumentation: 3 mm dermal curette Skin substitute: Puraply AM x 2    Lot #: [id number] exp 10/23/22    Lot #: [id number] exp 08/30/22 Hydration: graft hydrated with saline Secondary Dressing: mepitel Disposition: Patient tolerated procedure well. Patient to return in 1 week for follow-up.   No follow-ups on file.

## 2020-08-20 ENCOUNTER — Ambulatory Visit (AMBULATORY_SURGERY_CENTER): Payer: BLUE CROSS/BLUE SHIELD | Admitting: *Deleted

## 2020-08-20 ENCOUNTER — Other Ambulatory Visit: Payer: Self-pay

## 2020-08-20 VITALS — Ht 72.0 in | Wt 319.0 lb

## 2020-08-20 DIAGNOSIS — Z1211 Encounter for screening for malignant neoplasm of colon: Secondary | ICD-10-CM

## 2020-08-20 NOTE — Progress Notes (Signed)
Pt verified name, DOB, address and insurance during PV today. Pt mailed instruction packet to included paper to complete and mail back to [facility] with addressed and stamped envelope, Emmi video, copy of consent form to read and not return, and instructions.  Pt encouraged to call with questions or issues.  My Chart instructions to pt as well    No egg or soy allergy known to patient  No issues with past sedation with any surgeries or procedures- with wisdom teeth hard time waking up  Patient denies ever being told they had issues or difficulty with intubation  No FH of Malignant Hyperthermia No diet pills per patient No home 02 use per patient  No blood thinners per patient  Pt denies issues with constipation  No A fib or A flutter  EMMI video to pt or via [place] 19 guidelines implemented in [name] today with Pt and RN  Pt is fully vaccinated  for Covid   Due to the COVID-19 pandemic we are asking patients to follow certain guidelines.  Pt aware of COVID protocols and [facility] guidelines

## 2020-08-24 ENCOUNTER — Ambulatory Visit: Payer: BLUE CROSS/BLUE SHIELD | Admitting: Podiatry

## 2020-08-24 ENCOUNTER — Ambulatory Visit (INDEPENDENT_AMBULATORY_CARE_PROVIDER_SITE_OTHER): Payer: BLUE CROSS/BLUE SHIELD

## 2020-08-24 ENCOUNTER — Other Ambulatory Visit: Payer: Self-pay

## 2020-08-24 DIAGNOSIS — L97411 Non-pressure chronic ulcer of right heel and midfoot limited to breakdown of skin: Secondary | ICD-10-CM

## 2020-08-24 DIAGNOSIS — M86171 Other acute osteomyelitis, right ankle and foot: Secondary | ICD-10-CM

## 2020-08-24 DIAGNOSIS — E11621 Type 2 diabetes mellitus with foot ulcer: Secondary | ICD-10-CM

## 2020-08-24 DIAGNOSIS — E08621 Diabetes mellitus due to underlying condition with foot ulcer: Secondary | ICD-10-CM | POA: Diagnosis not present

## 2020-08-24 DIAGNOSIS — L97513 Non-pressure chronic ulcer of other part of right foot with necrosis of muscle: Secondary | ICD-10-CM | POA: Diagnosis not present

## 2020-08-24 MED ORDER — DOXYCYCLINE HYCLATE 100 MG PO TABS: 100.0000 mg | ORAL_TABLET | Freq: Two times a day (BID) | ORAL | 0 refills | Status: DC

## 2020-08-24 NOTE — Progress Notes (Signed)
  Subjective:  Patient ID: [name], male    DOB: 06/19/1970,  MRN: [id number]  Chief Complaint  Patient presents with   wound care    Wound care right foot    50 y.o. male presents for wound care. Hx confirmed with patient. Continues to improve. Objective:  Physical Exam: Wound Location: right lateral plantar foot Wound Measurement: 1*0.4*0.7 Wound Base: Granular/Healthy Peri-wound: Macerated Exudate: Moderate amount Serosanguinous exudate wound without warmth, erythema, signs of acute infection Assessment:   1. Diabetic ulcer of toe of right foot associated with diabetes mellitus due to underlying condition, with necrosis of muscle ([place])   2. Diabetic ulcer of right midfoot associated with type 2 diabetes mellitus, limited to breakdown of skin ([place])   3. Acute osteomyelitis of right ankle or foot ([place])    Plan:  Patient was evaluated and treated and all questions answered.  Ulcer right foot -Wound improving in depth and overall size. -Skin graft substitute applied to promote healing  Procedure: Application Skin Graft Substitute Rationale: Wound in need of advanced wound therapy to accelerate healing Pre-Debridement Wound Measurements: 1 cm x 0.4 cm x 0.7 cm  Post-Debridement Wound Measurements: same as pre-debridement. Type of Debridement: Selective Tissue Removed: Devitalized soft-tissue Instrumentation: 3 mm dermal curette Skin substitute: Puraply AM total 30 units    Lot #: [id number] (for both grafts)    Expiration: 10/22/12 Hydration: graft hydrated with saline Secondary Dressing: mepitel, kerlix, ABD PAD, and coban Disposition: Patient tolerated procedure well. Patient to return in 1 week for follow-up.  Right 3rd toe ulceration with probe to bone -No signs of OM today on XR -Will monitor closely -Rx doxycycline -Advised he needs to wear his surgical shoe as much as possible rather than boots to offload these areas. Unfortunately his boots are  necessary at work. -Dressed with iodosorb and gauze today.  Right hallux blister -Improving, fully granular -Dressed with iodosorb and gauze today, patient to do triple abx ointment daily.   No follow-ups on file.

## 2020-08-25 ENCOUNTER — Other Ambulatory Visit: Payer: Self-pay | Admitting: Family Medicine

## 2020-08-31 ENCOUNTER — Ambulatory Visit: Payer: BLUE CROSS/BLUE SHIELD | Admitting: Podiatry

## 2020-08-31 ENCOUNTER — Other Ambulatory Visit: Payer: Self-pay | Admitting: Podiatry

## 2020-08-31 ENCOUNTER — Ambulatory Visit (INDEPENDENT_AMBULATORY_CARE_PROVIDER_SITE_OTHER): Payer: BLUE CROSS/BLUE SHIELD

## 2020-08-31 ENCOUNTER — Telehealth: Payer: Self-pay | Admitting: Podiatry

## 2020-08-31 ENCOUNTER — Telehealth: Payer: Self-pay | Admitting: Internal Medicine

## 2020-08-31 ENCOUNTER — Inpatient Hospital Stay (HOSPITAL_COMMUNITY)
Admission: AD | Admit: 2020-08-31 | Discharge: 2020-09-08 | DRG: 617 | Disposition: A | Discharge status: Home Health | Payer: BLUE CROSS/BLUE SHIELD | Attending: Family Medicine | Admitting: Family Medicine

## 2020-08-31 ENCOUNTER — Telehealth: Payer: Self-pay | Admitting: *Deleted

## 2020-08-31 ENCOUNTER — Other Ambulatory Visit: Payer: Self-pay

## 2020-08-31 VITALS — BP 162/90 | Temp 98.2°F

## 2020-08-31 DIAGNOSIS — M79671 Pain in right foot: Secondary | ICD-10-CM | POA: Diagnosis not present

## 2020-08-31 DIAGNOSIS — E11649 Type 2 diabetes mellitus with hypoglycemia without coma: Secondary | ICD-10-CM | POA: Diagnosis not present

## 2020-08-31 DIAGNOSIS — L97514 Non-pressure chronic ulcer of other part of right foot with necrosis of bone: Secondary | ICD-10-CM

## 2020-08-31 DIAGNOSIS — E1169 Type 2 diabetes mellitus with other specified complication: Secondary | ICD-10-CM | POA: Diagnosis not present

## 2020-08-31 DIAGNOSIS — L03115 Cellulitis of right lower limb: Secondary | ICD-10-CM | POA: Diagnosis present

## 2020-08-31 DIAGNOSIS — I10 Essential (primary) hypertension: Secondary | ICD-10-CM | POA: Diagnosis present

## 2020-08-31 DIAGNOSIS — E11628 Type 2 diabetes mellitus with other skin complications: Secondary | ICD-10-CM | POA: Diagnosis present

## 2020-08-31 DIAGNOSIS — Z8249 Family history of ischemic heart disease and other diseases of the circulatory system: Secondary | ICD-10-CM

## 2020-08-31 DIAGNOSIS — I152 Hypertension secondary to endocrine disorders: Secondary | ICD-10-CM | POA: Diagnosis not present

## 2020-08-31 DIAGNOSIS — L97513 Non-pressure chronic ulcer of other part of right foot with necrosis of muscle: Secondary | ICD-10-CM

## 2020-08-31 DIAGNOSIS — E1122 Type 2 diabetes mellitus with diabetic chronic kidney disease: Secondary | ICD-10-CM | POA: Diagnosis present

## 2020-08-31 DIAGNOSIS — E782 Mixed hyperlipidemia: Secondary | ICD-10-CM | POA: Diagnosis present

## 2020-08-31 DIAGNOSIS — E11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ical complication: Secondary | ICD-10-CM | POA: Diagnosis present

## 2020-08-31 DIAGNOSIS — B965 Pseudomonas (aeruginosa) (mallei) (pseudomallei) as the cause of diseases classified elsewhere: Secondary | ICD-10-CM | POA: Diagnosis present

## 2020-08-31 DIAGNOSIS — Z89421 Acquired absence of other right toe(s): Secondary | ICD-10-CM

## 2020-08-31 DIAGNOSIS — L089 Local infection of the skin and subcutaneous tissue, unspecified: Secondary | ICD-10-CM | POA: Diagnosis not present

## 2020-08-31 DIAGNOSIS — E785 Hyperlipidemia, unspecified: Secondary | ICD-10-CM

## 2020-08-31 DIAGNOSIS — E08621 Diabetes mellitus due to underlying condition with foot ulcer: Secondary | ICD-10-CM

## 2020-08-31 DIAGNOSIS — E538 Deficiency of other specified B group vitamins: Secondary | ICD-10-CM | POA: Diagnosis present

## 2020-08-31 DIAGNOSIS — Z794 Long term (current) use of insulin: Secondary | ICD-10-CM | POA: Diagnosis not present

## 2020-08-31 DIAGNOSIS — N182 Chronic kidney disease, stage 2 (mild): Secondary | ICD-10-CM | POA: Diagnosis present

## 2020-08-31 DIAGNOSIS — E1159 Type 2 diabetes mellitus with other circulatory complications: Secondary | ICD-10-CM | POA: Diagnosis not present

## 2020-08-31 DIAGNOSIS — I129 Hypertensive chronic kidney disease with stage 1 through stage 4 chronic kidney disease, or unspecified chronic kidney disease: Secondary | ICD-10-CM | POA: Diagnosis present

## 2020-08-31 DIAGNOSIS — M14671 Charcot's joint, right ankle and foot: Secondary | ICD-10-CM

## 2020-08-31 DIAGNOSIS — M869 Osteomyelitis, unspecified: Secondary | ICD-10-CM | POA: Diagnosis present

## 2020-08-31 DIAGNOSIS — E1165 Type 2 diabetes mellitus with hyperglycemia: Secondary | ICD-10-CM | POA: Diagnosis present

## 2020-08-31 DIAGNOSIS — B9561 Methicillin susceptible Staphylococcus aureus infection as the cause of diseases classified elsewhere: Secondary | ICD-10-CM | POA: Diagnosis present

## 2020-08-31 DIAGNOSIS — N183 Chronic kidney disease, stage 3 unspecified: Secondary | ICD-10-CM

## 2020-08-31 DIAGNOSIS — L97519 Non-pressure chronic ulcer of other part of right foot with unspecified severity: Secondary | ICD-10-CM | POA: Diagnosis present

## 2020-08-31 DIAGNOSIS — M86171 Other acute osteomyelitis, right ankle and foot: Secondary | ICD-10-CM | POA: Diagnosis present

## 2020-08-31 DIAGNOSIS — E1142 Type 2 diabetes mellitus with diabetic polyneuropathy: Secondary | ICD-10-CM | POA: Diagnosis present

## 2020-08-31 DIAGNOSIS — E1151 Type 2 diabetes mellitus with diabetic peripheral angiopathy without gangrene: Secondary | ICD-10-CM | POA: Diagnosis present

## 2020-08-31 DIAGNOSIS — L97411 Non-pressure chronic ulcer of right heel and midfoot limited to breakdown of skin: Secondary | ICD-10-CM

## 2020-08-31 DIAGNOSIS — E11621 Type 2 diabetes mellitus with foot ulcer: Principal | ICD-10-CM | POA: Diagnosis present

## 2020-08-31 DIAGNOSIS — Z79899 Other long term (current) drug therapy: Secondary | ICD-10-CM | POA: Diagnosis not present

## 2020-08-31 DIAGNOSIS — Z9889 Other specified postprocedural states: Secondary | ICD-10-CM

## 2020-08-31 DIAGNOSIS — N1831 Chronic kidney disease, stage 3a: Secondary | ICD-10-CM | POA: Diagnosis present

## 2020-08-31 DIAGNOSIS — Z20822 Contact with and (suspected) exposure to covid-19: Secondary | ICD-10-CM | POA: Diagnosis present

## 2020-08-31 DIAGNOSIS — Z7982 Long term (current) use of aspirin: Secondary | ICD-10-CM

## 2020-08-31 DIAGNOSIS — E1140 Type 2 diabetes mellitus with diabetic neuropathy, unspecified: Secondary | ICD-10-CM | POA: Diagnosis present

## 2020-08-31 DIAGNOSIS — Z6841 Body Mass Index (BMI) 40.0 and over, adult: Secondary | ICD-10-CM | POA: Diagnosis not present

## 2020-08-31 DIAGNOSIS — M86671 Other chronic osteomyelitis, right ankle and foot: Secondary | ICD-10-CM | POA: Diagnosis not present

## 2020-08-31 LAB — CBC
HCT: 36 % — ABNORMAL LOW (ref 39.0–52.0)
Hemoglobin: 11.2 g/dL — ABNORMAL LOW (ref 13.0–17.0)
MCH: 25.6 pg — ABNORMAL LOW (ref 26.0–34.0)
MCHC: 31.1 g/dL (ref 30.0–36.0)
MCV: 82.2 fL (ref 80.0–100.0)
Platelets: 341 10*3/uL (ref 150–400)
RBC: 4.38 MIL/uL (ref 4.22–5.81)
RDW: 13.1 % (ref 11.5–15.5)
WBC: 8.3 10*3/uL (ref 4.0–10.5)
nRBC: 0 % (ref 0.0–0.2)

## 2020-08-31 LAB — CREATININE, SERUM
Creatinine, Ser: 1.16 mg/dL (ref 0.61–1.24)
GFR, Estimated: >60 mL/min (ref >60)

## 2020-08-31 MED ORDER — ONDANSETRON HCL 4 MG PO TABS: 4.0000 mg | ORAL_TABLET | Freq: Four times a day (QID) | ORAL | Status: DC | PRN

## 2020-08-31 MED ORDER — RAMIPRIL 5 MG PO CAPS
5.0000 mg | ORAL_CAPSULE | Freq: Every day | ORAL | Status: DC
  Administered 2020-09-01 – 2020-09-02 (×2): 5 mg via ORAL
  Filled 2020-08-31 (×2): qty 1

## 2020-08-31 MED ORDER — VITAMIN B-12 1000 MCG PO TABS
5000.0000 ug | ORAL_TABLET | Freq: Every day | ORAL | Status: DC
  Administered 2020-09-01 – 2020-09-08 (×7): 5000 ug via ORAL
  Filled 2020-08-31 (×8): qty 5

## 2020-08-31 MED ORDER — ASCORBIC ACID 500 MG PO TABS
500.0000 mg | ORAL_TABLET | Freq: Every day | ORAL | Status: DC
  Administered 2020-09-01 – 2020-09-08 (×7): 500 mg via ORAL
  Filled 2020-08-31 (×7): qty 1

## 2020-08-31 MED ORDER — HYDROMORPHONE HCL 1 MG/ML IJ SOLN: 0.5000 mg | INTRAMUSCULAR | Status: DC | PRN

## 2020-08-31 MED ORDER — HYDROCHLOROTHIAZIDE 12.5 MG PO CAPS
12.5000 mg | ORAL_CAPSULE | Freq: Every day | ORAL | Status: DC
  Administered 2020-09-01: 12.5 mg via ORAL
  Filled 2020-08-31: qty 1

## 2020-08-31 MED ORDER — INSULIN ASPART 100 UNIT/ML IJ SOLN
0.0000 [IU] | Freq: Every day | INTRAMUSCULAR | Status: DC
Start: 2020-08-31 — End: 2020-09-08

## 2020-08-31 MED ORDER — OMEGA-3-ACID ETHYL ESTERS 1 G PO CAPS
1.0000 g | ORAL_CAPSULE | Freq: Every day | ORAL | Status: DC
  Administered 2020-09-01 – 2020-09-08 (×7): 1 g via ORAL
  Filled 2020-08-31 (×7): qty 1

## 2020-08-31 MED ORDER — ENOXAPARIN SODIUM 40 MG/0.4ML IJ SOSY
40.0000 mg | PREFILLED_SYRINGE | INTRAMUSCULAR | Status: DC
  Administered 2020-09-01 – 2020-09-08 (×7): 40 mg via SUBCUTANEOUS
  Filled 2020-08-31 (×7): qty 0.4

## 2020-08-31 MED ORDER — VANCOMYCIN HCL 2000 MG/400ML IV SOLN
2000.0000 mg | Freq: Once | INTRAVENOUS | Status: AC
  Administered 2020-08-31: 2000 mg via INTRAVENOUS
  Filled 2020-08-31: qty 400

## 2020-08-31 MED ORDER — INSULIN ASPART 100 UNIT/ML IJ SOLN
0.0000 [IU] | Freq: Three times a day (TID) | INTRAMUSCULAR | Status: DC
  Administered 2020-09-02: 3 [IU] via SUBCUTANEOUS
  Administered 2020-09-04: 2 [IU] via SUBCUTANEOUS
  Administered 2020-09-05 – 2020-09-07 (×3): 3 [IU] via SUBCUTANEOUS
  Administered 2020-09-08: 4 [IU] via SUBCUTANEOUS

## 2020-08-31 MED ORDER — FENOFIBRATE 54 MG PO TABS
54.0000 mg | ORAL_TABLET | Freq: Every day | ORAL | Status: DC
Start: 2020-09-01 — End: 2020-09-08
  Administered 2020-09-01 – 2020-09-08 (×7): 54 mg via ORAL
  Filled 2020-08-31 (×8): qty 1

## 2020-08-31 MED ORDER — VITAMIN B-12 5000 MCG SL SUBL: 5000.0000 ug | SUBLINGUAL_TABLET | Freq: Every day | SUBLINGUAL | Status: DC

## 2020-08-31 MED ORDER — NAPROXEN SODIUM 275 MG PO TABS
550.0000 mg | ORAL_TABLET | Freq: Two times a day (BID) | ORAL | Status: DC | PRN
  Filled 2020-08-31: qty 2

## 2020-08-31 MED ORDER — ATORVASTATIN CALCIUM 40 MG PO TABS
60.0000 mg | ORAL_TABLET | Freq: Every day | ORAL | Status: DC
  Administered 2020-09-01 – 2020-09-08 (×7): 60 mg via ORAL
  Filled 2020-08-31 (×7): qty 1

## 2020-08-31 MED ORDER — ADULT MULTIVITAMIN W/MINERALS CH
1.0000 | ORAL_TABLET | Freq: Two times a day (BID) | ORAL | Status: DC
  Administered 2020-08-31 – 2020-09-08 (×15): 1 via ORAL
  Filled 2020-08-31 (×15): qty 1

## 2020-08-31 MED ORDER — VITAMIN B-6 100 MG PO TABS
100.0000 mg | ORAL_TABLET | Freq: Two times a day (BID) | ORAL | Status: DC
  Administered 2020-08-31 – 2020-09-08 (×15): 100 mg via ORAL
  Filled 2020-08-31 (×16): qty 1

## 2020-08-31 MED ORDER — ASPIRIN EC 81 MG PO TBEC
81.0000 mg | DELAYED_RELEASE_TABLET | Freq: Every day | ORAL | Status: DC
  Administered 2020-09-01 – 2020-09-08 (×7): 81 mg via ORAL
  Filled 2020-08-31 (×7): qty 1

## 2020-08-31 MED ORDER — ONDANSETRON HCL 4 MG/2ML IJ SOLN: 4.0000 mg | Freq: Four times a day (QID) | INTRAMUSCULAR | Status: DC | PRN

## 2020-08-31 MED ORDER — SODIUM CHLORIDE 0.9 % IV SOLN: INTRAVENOUS | Status: DC

## 2020-08-31 NOTE — Telephone Encounter (Signed)
Patients wife calling to ask if it would be ok for the patient to have a procedure on his foot said he will be given antibiotics and is seeking advise since the patient has a procedure on Monday for colonoscopy.

## 2020-08-31 NOTE — H&P (Signed)
History and Physical   [name] [id number] DOB: [birth year] DOA: 08/31/2020  Referring MD/NP/PA: Dr. Hardie Pulley podiatry  PCP: Marin Olp, MD   Outpatient Specialists: Dr. March Rummage  Patient coming from: Home  Chief Complaint: Right foot ulcer  HPI: [name] is a 50 y.o. male with medical history significant of uncontrolled diabetes, hypertension, diabetic neuropathy, diabetic nephropathy, pituitary macroadenoma adenoma, peripheral vascular disease status post amputation of the right fifth and fourth toes 4 years ago.  Patient is here after reevaluation today by podiatry indicating worsening of his ulcer.  He has a wound at the right lateral plantar foot below the third digit.  It measures 1 x 0.4 x 0.7.  It is macerated with moderate amount of serosanguineous exudate revealing right third toe infection possible osteomyelitis.  There is exposed bone.  Podiatry therefore sent him over to the hospital for MRI, IV antibiotics and possible surgery.  At this point he complains of no pain.  Patient also insists he takes his medication including his insulin..  Review of Systems: As per HPI otherwise 10 point review of systems negative.    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   (06-17-2019 pt stated has been stable with no changes and followed by pcp)   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[place])    11-06-2016  lower extremity  angiography (dr [name])  mil tibial disease w/ occlusion of the distal peroneal artery and dorsalis pedis artery but has widely patent posterior tibial and anterior tibial arterties   Type 2 diabetes mellitus treated with insulin ([place])    followed by pcp   (06-17-2019  pt stated checks blood sugar daily in am,  fasting sugar-- average 71)   Vitamin B 12 deficiency     Past Surgical History:  Procedure Laterality Date   AMPUTATION Right 07/31/2016   Procedure: AMPUTATION RIGHT FIFTH RAY AND APPLICATION OF WOUND [place];  Surgeon: Dorna Leitz, MD;  Location: [facility];  Service: Orthopedics;  Laterality: Right;   AMPUTATION Right 08/08/2016   Procedure: RAY AMPUTATION RIGHT FOURTH METATARSAL, DEBRIDEMENT OF SUBQ TISSUE,BONE AND SKIN WITH WOUND CLOSURE;  Surgeon: Dorna Leitz, MD;  [facility];  Service: Orthopedics;  Laterality: Right;   AMPUTATION TOE Left 06/21/2019   Procedure: AMPUTATION TOE SECOND LEFT, DEBRIDEMENT OF LEFT GREAT TOE;  Surgeon: [name], DPM;  Location: [place];  Service: Podiatry;  Laterality: Left;   ANAL FISSURE REPAIR  [id number]   APPLICATION OF A-CELL OF EXTREMITY Right 08/13/2016   Procedure: APPLICATION OF A-CELL AND VAC;  Surgeon: Wallace Going, DO;  [facility];  Service: Plastics;  Laterality: Right;   APPLICATION OF A-CELL OF EXTREMITY Right 07/16/2017   Procedure: CELLERATE PLACEMENT;  Surgeon: Wallace Going, DO;  Location: [place];  Service: Plastics;  Laterality: Right;   APPLICATION OF WOUND  VAC Right 09/18/2016   Procedure: APPLICATION OF WOUND VAC (PT. WILL BRING HIS OWN);  Surgeon: Wallace Going, DO;  [facility];  Service: Plastics;  Laterality: Right;   APPLICATION OF WOUND VAC Right 10/30/2016   Procedure: APPLICATION OF WOUND VAC;  Surgeon: Wallace Going, DO;  Location: [place];  Service: Plastics;  Laterality: Right;   APPLICATION OF WOUND VAC  Right 12/01/2016   Procedure: APPLICATION OF WOUND VAC;  Surgeon: Wallace Going, DO;  [facility];  Service: Plastics;  Laterality: Right;   BONE BIOPSY Left 01/04/2018   Procedure: LEFT FOOT BONE BIOPSY;  Surgeon: [name], DPM;  Location: [place];  Service: Podiatry;  Laterality: Left;   CIRCUMCISION  2006   DEBRIDEMENT  FOOT Right 07/16/2017   I & D EXTREMITY Right 09/18/2016   Procedure: IRRIGATION AND DEBRIDEMENT OF RIGHT LATERAL DIABLTIC FOOT ULCER;  Surgeon: Wallace Going, DO;  Location: [place];  Service: Plastics;  Laterality: Right;   I & D EXTREMITY Right 03/11/2017   Procedure: IRRIGATION AND DEBRIDEMENT OF RIGHT LATERAL FOOT WOUND WITH CELLERATE COLLAGEN;  Surgeon: Wallace Going, DO;  Location: [place];  Service: Plastics;  Laterality: Right;   INCISION AND DRAINAGE OF WOUND Right 08/13/2016   Procedure: IRRIGATION AND DEBRIDEMENT WOUND RIGHT FOOT;  Surgeon: Wallace Going, DO;  [facility];  Service: Plastics;  Laterality: Right;   IR PATIENT EVAL TECH 0-60 MINS  01/12/2020   LAPAROSCOPIC CHOLECYSTECTOMY  09-09-2006   dr Zella Richer   LOWER EXTREMITY ANGIOGRAPHY N/A 10/27/2016   Procedure: Lower Extremity Angiography;  Surgeon: Angelia Mould, MD;  Location: [place] [facility];  Service: Cardiovascular;  Laterality: N/A;   MASS EXCISION Right 10/30/2016   Procedure: EXCISION RIGHT FOOT WOUND WITH VAC PLACEMENT;  Surgeon: Wallace Going, DO;  Location: [place];  Service: Plastics;  Laterality: Right;   MASS EXCISION Right 07/16/2017   Procedure: EXCISION OF RIGHT FOOT WOUND WITH CELLERATE PLACEMENT;  Surgeon: Wallace Going, DO;  Location: [place];  Service: Plastics;  Laterality: Right;   METATARSAL HEAD EXCISION Left 01/04/2018   Procedure: 5TH METATARSAL RESECTION,  WOUND CLOSURE,  Adjacent Tissue Transfer.;  Surgeon: [name], DPM;   Location: [place];  Service: Podiatry;  Laterality: Left;   SKIN SPLIT GRAFT Right 12/01/2016   Procedure: SKIN GRAFT SPLIT THICKNESS TO RIGHT LATERAL FOOT WOUND 9 X 3 CM;  Surgeon: Wallace Going, DO;  [facility];  Service: Plastics;  Laterality: Right;   wisdom teeth extracted  1984     reports that he has never smoked. He has never used smokeless tobacco. He reports that he does not drink alcohol and does not use drugs.  No Known Allergies  Family History  Problem Relation Age of Onset   Sarcoidosis Mother    Stroke Father        early 69s. former smoker   Diabetes Father    Hypertension Father    Hypertension Sister    Diabetes Paternal Grandmother    Congenital heart disease Paternal Grandmother        died of complications   Healthy Daughter    Colon cancer Neg Hx    Colon polyps Neg Hx    Esophageal cancer Neg Hx    Rectal cancer Neg Hx    Stomach cancer Neg Hx      Prior to  Admission medications   Medication Sig Start Date End Date Taking? Authorizing Provider  ascorbic acid (VITAMIN C) 500 MG tablet Take 500 mg by mouth daily. 08/14/16  Yes [provider]  aspirin EC 81 MG tablet Take 81 mg by mouth daily.   Yes [provider]  atorvastatin (LIPITOR) 40 MG tablet TAKE 1 AND 1/2 TABLETS BY MOUTH EVERY MORNING Patient taking differently: Take 60 mg by mouth daily. 01/30/20  Yes Marin Olp, MD  Cyanocobalamin (VITAMIN B-12) 5000 MCG SUBL Place 5,000 mcg under the tongue daily.   Yes [provider]  fenofibrate (TRICOR) 48 MG tablet TAKE 1 TABLET BY MOUTH EVERY DAY Patient taking differently: Take 48 mg by mouth daily. 07/25/20  Yes Marin Olp, MD  hydrochlorothiazide (MICROZIDE) 12.5 MG capsule TAKE 1 CAPSULE BY MOUTH EVERY DAY IN THE MORNING Patient taking differently: Take 12.5 mg by mouth daily. 06/27/20  Yes Marin Olp, MD  insulin aspart (NOVOLOG) 100 UNIT/ML injection INJECT 15 TO 25 UNITS UNDER THE SKIN TWICE  DAILY Patient taking differently: Inject 15-25 Units into the skin 2 (two) times daily. Per sliding scale 02/14/20  Yes Marin Olp, MD  Insulin Degludec (TRESIBA) 100 UNIT/ML SOLN Inject 64 units under the skin at bedtime. Patient taking differently: Inject 78-86 Units into the skin at bedtime. Per sliding scale 07/26/20  Yes Marin Olp, MD  JANUVIA 50 MG tablet TAKE 1 TABLET BY MOUTH EVERY DAY IN THE MORNING Patient taking differently: Take 50 mg by mouth daily. 08/29/20  Yes Marin Olp, MD  Multiple Vitamin (MULTIVITAMIN WITH MINERALS) TABS tablet Take 1 tablet by mouth 2 (two) times daily.   Yes [provider]  naproxen sodium (ALEVE) 220 MG tablet Take 440 mg by mouth 2 (two) times daily as needed (work/headache/pain/fever).   Yes [provider]  Omega-3 Fatty Acids (FISH OIL) 1360 MG CAPS Take 1,360 mg by mouth daily.    Yes [provider]  pyridoxine (B-6) 100 MG tablet Take 100 mg by mouth 2 (two) times daily.   Yes [provider]  ramipril (ALTACE) 5 MG capsule TAKE 1 CAPSULE BY MOUTH EVERY DAY IN THE MORNING Patient taking differently: Take 5 mg by mouth daily. 08/06/20  Yes Marin Olp, MD  doxycycline (VIBRA-TABS) 100 MG tablet Take 1 tablet (100 mg total) by mouth 2 (two) times daily. 08/24/20   [name], DPM  ferrous sulfate 325 (65 FE) MG tablet Take 1 tablet (325 mg total) by mouth 2 (two) times daily with a meal. Patient not taking: Reported on 08/31/2020 08/14/16   Dhungel, Flonnie Overman, MD  glucose blood (ACCU-CHEK GUIDE) test strip Dx E11.49 - Use to check blood glucose three times daily or as directed. 01/30/20   Marin Olp, MD  Insulin Syringe-Needle U-100 (BD INSULIN SYRINGE U/F) 31G X 5/16" 0.5 ML MISC Use to inject insulin 4 times daily 10/20/19   Marin Olp, MD  Insulin Syringe-Needle U-100 (BD INSULIN SYRINGE U/F) 31G X 5/16" 1 ML MISC Use to inject tresiba/long acting insulin once daily 02/24/20    Marin Olp, MD  Insulin Syringe-Needle U-100 (BD VEO INSULIN SYR U/F 1/2UNIT) 31G X 15/64" 0.3 ML MISC USE TO INJECT INSULIN FOUR TIMES DAILY AS DIRECTED 10/14/19   Marin Olp, MD  Lancets [facility] ULTRASOFT) lancets Use to test blood sugars daily. Dx: E11.9 04/11/20   Marin Olp, MD  Lancets MISC USED TO CHECK BLOOD GLUCOSE  3-4 TIMES DAILY 10/15/18   Marin Olp, MD    Physical Exam: Vitals:   08/31/20 1841 08/31/20 1938  BP: (!) 149/85 (!) 141/79  Pulse: 85 74  Resp: 17 20  Temp: 99 F (37.2 C) 98.4 F (36.9 C)  TempSrc: Oral Oral  SpO2: 100% 100%      Constitutional: Chronically ill looking, morbidly obese no distress Vitals:   08/31/20 1841 08/31/20 1938  BP: (!) 149/85 (!) 141/79  Pulse: 85 74  Resp: 17 20  Temp: 99 F (37.2 C) 98.4 F (36.9 C)  TempSrc: Oral Oral  SpO2: 100% 100%   Eyes: PERRL, lids and conjunctivae normal ENMT: Mucous membranes are moist. Posterior pharynx clear of any exudate or lesions.Normal dentition.  Neck: normal, supple, no masses, no thyromegaly Respiratory: clear to auscultation bilaterally, no wheezing, no crackles. Normal respiratory effort. No accessory muscle use.  Cardiovascular: Regular rate and rhythm, no murmurs / rubs / gallops. No extremity edema. 2+ pedal pulses. No carotid bruits.  Abdomen: no tenderness, no masses palpated. No hepatosplenomegaly. Bowel sounds positive.  Musculoskeletal: no clubbing / cyanosis. No joint deformity upper and lower extremities. Good ROM, no contractures. Normal muscle tone.  Skin: Right foot maceration around the third digit, serosanguineous discharge, currently dressed, Neurologic: CN 2-12 grossly intact. Sensation intact, DTR normal. Strength 5/5 in all 4.  Psychiatric: Normal judgment and insight. Alert and oriented x 3. Normal mood.     Labs on Admission: I have personally reviewed following labs and imaging studies  CBC: No results for input(s): WBC, NEUTROABS,  HGB, HCT, MCV, PLT in the last 168 hours. Basic Metabolic Panel: No results for input(s): NA, K, CL, CO2, GLUCOSE, BUN, CREATININE, CALCIUM, MG, PHOS in the last 168 hours. GFR: CrCl cannot be calculated (Patient's most recent lab result is older than the maximum 21 days allowed.). Liver Function Tests: No results for input(s): AST, ALT, ALKPHOS, BILITOT, PROT, ALBUMIN in the last 168 hours. No results for input(s): LIPASE, AMYLASE in the last 168 hours. No results for input(s): AMMONIA in the last 168 hours. Coagulation Profile: No results for input(s): INR, PROTIME in the last 168 hours. Cardiac Enzymes: No results for input(s): CKTOTAL, CKMB, CKMBINDEX, TROPONINI in the last 168 hours. BNP (last 3 results) No results for input(s): PROBNP in the last 8760 hours. HbA1C: No results for input(s): HGBA1C in the last 72 hours. CBG: No results for input(s): GLUCAP in the last 168 hours. Lipid Profile: No results for input(s): CHOL, HDL, LDLCALC, TRIG, CHOLHDL, LDLDIRECT in the last 72 hours. Thyroid Function Tests: No results for input(s): TSH, T4TOTAL, FREET4, T3FREE, THYROIDAB in the last 72 hours. Anemia Panel: No results for input(s): VITAMINB12, FOLATE, FERRITIN, TIBC, IRON, RETICCTPCT in the last 72 hours. Urine analysis:    Component Value Date/Time   COLORURINE AMBER (A) 07/28/2016 1846   APPEARANCEUR CLOUDY (A) 07/28/2016 1846   LABSPEC 1.021 07/28/2016 1846   PHURINE 5.0 07/28/2016 1846   GLUCOSEU NEGATIVE 07/28/2016 1846   HGBUR NEGATIVE 07/28/2016 1846   BILIRUBINUR Negative 09/29/2017 1142   KETONESUR NEGATIVE 07/28/2016 1846   PROTEINUR Negative 09/29/2017 1142   PROTEINUR 100 (A) 07/28/2016 1846   UROBILINOGEN 0.2 09/29/2017 1142   UROBILINOGEN 0.2 01/26/2007 1759   NITRITE Negative 09/29/2017 1142   NITRITE NEGATIVE 07/28/2016 1846   LEUKOCYTESUR Negative 09/29/2017 1142   Sepsis Labs: '@LABRCNTIP'$ (procalcitonin:4,lacticidven:4) )No results found for this or  any previous visit (from the past 240 hour(s)).   Radiological Exams on Admission:  DG Foot Complete Right  Result Date: 08/31/2020 Please see detailed radiograph report in office note.     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  Correctly.  We will get MRI of the foot to determine the extent of his infection and rule out osteomyelitis.  IV Vanco and cefepime initiated.  Elevate the foot.  Wound care.  Continue per podiatry.  #2 uncontrolled diabetes: Initiate sliding scale insulin with long-acting insulin.  Adjust dose appropriately.  #3 morbid obesity: Dietary counseling.  #4 chronic kidney disease stage III: Has diabetic nephropathy.  Continue to monitor renal function  #5 peripheral vascular disease: Status post previous amputation of the fourth and fifth digits on the same side.  #6 essential hypertension: On hydrochlorothiazide and ramipril.  We will continue  #7 hyperlipidemia: Continue atorvastatin.   DVT prophylaxis: Lovenox Code Status: Full code Family Communication: No family at bedside Disposition Plan: Home Consults called: Dr. March Rummage, podiatry Admission status: Inpatient  Severity of Illness: The appropriate patient status for this patient is INPATIENT. Inpatient status is judged to be reasonable and necessary in order to provide the required intensity of service to ensure the patient's safety. The patient's presenting symptoms, physical exam findings, and initial radiographic  and laboratory data in the context of their chronic comorbidities is felt to place them at high risk for further clinical deterioration. Furthermore, it is not anticipated that the patient will be medically stable for discharge from the hospital within 2 midnights of admission. The following factors support the patient status of inpatient.   " The patient's presenting symptoms include right foot ulcer. " The worrisome physical exam findings include infected ulcer of the right foot. " The initial radiographic and laboratory data are worrisome because of awaiting MRI. " The chronic co-morbidities include diabetes.   * I certify that at the point of admission it is my clinical judgment that the patient will require inpatient hospital care spanning beyond 2 midnights from the point of admission due to high intensity of service, high risk for further deterioration and high frequency of surveillance required.Barbette Merino MD [facility] Pager [phone]  If 7PM-7AM, please contact night-coverage [email or web address] Password [email or web address]  08/31/2020, 10:50 PM

## 2020-08-31 NOTE — Telephone Encounter (Signed)
Patients wife calling to see if there is a room available at the hospital for patient to do overnight IV treatment. She was told the patient is on a waiting list for [place] or . Please advise.

## 2020-08-31 NOTE — Progress Notes (Unsigned)
Direct admit accepted referred by Dr [facility]  Dx  right third toe osteomylitis Admit abx and plan for surgical intervention

## 2020-08-31 NOTE — Plan of Care (Signed)

## 2020-08-31 NOTE — Progress Notes (Signed)
Received Pt from MD's office, alert and oriented x 4, ambulates independently, VS taken and recorded, not in acute distress. No orders on chart, MD paged. Awaiting orders.  Compression wrap to R foot, clean/dry/intact, per Pt, dressing was placed at the MD's office today.   Will endorse accordingly to oncoming RN.

## 2020-08-31 NOTE — Telephone Encounter (Signed)
Spoke with patient's wife. The patient is being admitted for a foot infection and given IV antibiotic course. Will reschedule the patient for 9/8 with Dr. Carlean Purl.

## 2020-08-31 NOTE — Progress Notes (Addendum)
Pharmacy Antibiotic Note  [name] is a 51 y.o. male admitted on 08/31/2020 with local warmth, erythema, worsening ulceration with fibronecrotic base and exposed bone to rt 3rd toe . Pharmacy has been consulted to dose vancomycin and cefepime for wound infection  Plan: Vancomycin 2gm IV x 1 then '1250mg'$  q12h  (AUC 468.2, Scr 1.16) Cefepime 2gm IV q8h Follow renal function and clinical course     Temp (24hrs), Avg:98.5 F (36.9 C), Min:98.2 F (36.8 C), Max:99 F (37.2 C)  Recent Labs  Lab 08/31/20 2315  WBC 8.3  CREATININE 1.16    Estimated Creatinine Clearance: 112.5 mL/min (by C-G formula based on SCr of 1.16 mg/dL).    No Known Allergies   Thank you for allowing pharmacy to be a part of this patient's care.  Dolly Rias RPh 08/31/2020, 11:59 PM

## 2020-08-31 NOTE — Telephone Encounter (Signed)
Called patient's wife - they are on the way to the hospital he has been given a bed

## 2020-08-31 NOTE — Progress Notes (Signed)
  Subjective:  Patient ID: [name], male    DOB: 1970-10-14,  MRN: [id number]  Chief Complaint  Patient presents with   Wound Check    Pt states concern of toe worsening. Denies fever/chills/nausea/vomiting.   50 y.o. male presents for wound care. Hx confirmed with patient. States the toe is looking worse. Objective:  Physical Exam: Wound Location: right lateral plantar foot Wound Measurement: 1*0.4*0.7 Wound Base: Granular/Healthy Peri-wound: Macerated Exudate: Moderate amount Serosanguinous exudate wound without warmth, erythema, signs of acute infection  Right 3rd toe with local warmth, erythema, worsening ulceration with fibronecrotic base and exposed bone. 2nd toe edema  Radiographs: taken and reviewed new erosions to the PIPJ. No SQ air.  Vitals:   08/31/20 1004  BP: (!) 162/90  Temp: 98.2 F (36.8 C)    Assessment:   1. Pain in right foot   2. Diabetic ulcer of toe of right foot associated with diabetes mellitus due to underlying condition, with necrosis of muscle ([name]   3. Diabetic ulcer of right midfoot associated with type 2 diabetes mellitus, limited to breakdown of skin ([name]   4. Acute osteomyelitis of right ankle or foot ([name]   5. Charcot's joint of foot, right    Plan:  Patient was evaluated and treated and all questions answered.  Ulcer right foot -Wound stable, no graft application today given infection.  Right 3rd toe ulceration with probe to bone -Repeat Xrs with signs of OM -Dressed with betadine WTD today -Will direct admit for IV abx given failure of PO therapy -Will likely need amputation of the toe. -Recc MRI at admission, empiric IV abx. -Arranged for direct admission, spoke with Dr. Kerrie Pleasure who agreed to admit. Bed request placed.  Right hallux blister -Wound stable   No follow-ups on file.

## 2020-09-01 ENCOUNTER — Inpatient Hospital Stay (HOSPITAL_COMMUNITY): Payer: BLUE CROSS/BLUE SHIELD

## 2020-09-01 DIAGNOSIS — M86671 Other chronic osteomyelitis, right ankle and foot: Secondary | ICD-10-CM | POA: Diagnosis not present

## 2020-09-01 DIAGNOSIS — E1142 Type 2 diabetes mellitus with diabetic polyneuropathy: Secondary | ICD-10-CM

## 2020-09-01 LAB — CBC
HCT: 34.3 % — ABNORMAL LOW (ref 39.0–52.0)
Hemoglobin: 10.7 g/dL — ABNORMAL LOW (ref 13.0–17.0)
MCH: 25.7 pg — ABNORMAL LOW (ref 26.0–34.0)
MCHC: 31.2 g/dL (ref 30.0–36.0)
MCV: 82.5 fL (ref 80.0–100.0)
Platelets: 327 10*3/uL (ref 150–400)
RBC: 4.16 MIL/uL — ABNORMAL LOW (ref 4.22–5.81)
RDW: 13.2 % (ref 11.5–15.5)
WBC: 7.5 10*3/uL (ref 4.0–10.5)
nRBC: 0 % (ref 0.0–0.2)

## 2020-09-01 LAB — COMPREHENSIVE METABOLIC PANEL
ALT: 30 U/L (ref 0–44)
AST: 19 U/L (ref 15–41)
Albumin: 3 g/dL — ABNORMAL LOW (ref 3.5–5.0)
Alkaline Phosphatase: 75 U/L (ref 38–126)
Anion gap: 6 (ref 5–15)
BUN: 11 mg/dL (ref 6–20)
CO2: 26 mmol/L (ref 22–32)
Calcium: 8.9 mg/dL (ref 8.9–10.3)
Chloride: 108 mmol/L (ref 98–111)
Creatinine, Ser: 1.11 mg/dL (ref 0.61–1.24)
GFR, Estimated: >60 mL/min (ref >60)
Glucose, Bld: 95 mg/dL (ref 70–99)
Potassium: 3.8 mmol/L (ref 3.5–5.1)
Sodium: 140 mmol/L (ref 135–145)
Total Bilirubin: 0.8 mg/dL (ref 0.3–1.2)
Total Protein: 7.1 g/dL (ref 6.5–8.1)

## 2020-09-01 LAB — HIV ANTIBODY (ROUTINE TESTING W REFLEX): HIV Screen 4th Generation wRfx: NONREACTIVE

## 2020-09-01 LAB — GLUCOSE, CAPILLARY
Glucose-Capillary: 106 mg/dL — ABNORMAL HIGH (ref 70–99)
Glucose-Capillary: 107 mg/dL — ABNORMAL HIGH (ref 70–99)
Glucose-Capillary: 142 mg/dL — ABNORMAL HIGH (ref 70–99)
Glucose-Capillary: 56 mg/dL — ABNORMAL LOW (ref 70–99)
Glucose-Capillary: 62 mg/dL — ABNORMAL LOW (ref 70–99)
Glucose-Capillary: 72 mg/dL (ref 70–99)
Glucose-Capillary: 79 mg/dL (ref 70–99)

## 2020-09-01 LAB — HEMOGLOBIN A1C
Hgb A1c MFr Bld: 6.5 % — ABNORMAL HIGH (ref 4.8–5.6)
Mean Plasma Glucose: 139.85 mg/dL

## 2020-09-01 LAB — SARS CORONAVIRUS 2 (TAT 6-24 HRS): SARS Coronavirus 2: NEGATIVE

## 2020-09-01 MED ORDER — DEXTROSE 50 % IV SOLN
INTRAVENOUS | Status: AC
  Filled 2020-09-01: qty 50

## 2020-09-01 MED ORDER — DEXTROSE-NACL 5-0.9 % IV SOLN: INTRAVENOUS | Status: DC

## 2020-09-01 MED ORDER — GADOBUTROL 1 MMOL/ML IV SOLN
10.0000 mL | Freq: Once | INTRAVENOUS | Status: AC | PRN
  Administered 2020-09-01: 10 mL via INTRAVENOUS

## 2020-09-01 MED ORDER — VANCOMYCIN HCL 1250 MG/250ML IV SOLN
1250.0000 mg | Freq: Two times a day (BID) | INTRAVENOUS | Status: DC
  Administered 2020-09-02 – 2020-09-03 (×5): 1250 mg via INTRAVENOUS
  Filled 2020-09-01 (×6): qty 250

## 2020-09-01 MED ORDER — VANCOMYCIN HCL 1500 MG/300ML IV SOLN: 1500.0000 mg | Freq: Two times a day (BID) | INTRAVENOUS | Status: DC

## 2020-09-01 MED ORDER — DEXTROSE 50 % IV SOLN
12.5000 g | INTRAVENOUS | Status: AC
  Administered 2020-09-01: 12.5 g via INTRAVENOUS

## 2020-09-01 MED ORDER — VANCOMYCIN HCL 1250 MG/250ML IV SOLN
1250.0000 mg | Freq: Two times a day (BID) | INTRAVENOUS | Status: DC
  Administered 2020-09-01: 1250 mg via INTRAVENOUS
  Filled 2020-09-01: qty 250

## 2020-09-01 MED ORDER — SODIUM CHLORIDE 0.9 % IV SOLN
2.0000 g | Freq: Three times a day (TID) | INTRAVENOUS | Status: DC
  Administered 2020-09-01 – 2020-09-07 (×20): 2 g via INTRAVENOUS
  Filled 2020-09-01 (×21): qty 2

## 2020-09-01 NOTE — Progress Notes (Signed)
PROGRESS NOTE    [name]  [id number] DOB: January 18, 1971 DOA: 08/31/2020 PCP: Marin Olp, MD   Brief Narrative: HPI per Dr. Jonelle Sidle on 08/31/2020  [name] is a 50 y.o. male with medical history significant of uncontrolled diabetes, hypertension, diabetic neuropathy, diabetic nephropathy, pituitary macroadenoma adenoma, peripheral vascular disease status post amputation of the right fifth and fourth toes 4 years ago.  Patient is here after reevaluation today by podiatry indicating worsening of his ulcer.  He has a wound at the right lateral plantar foot below the third digit.  It measures 1 x 0.4 x 0.7.  It is macerated with moderate amount of serosanguineous exudate revealing right third toe infection possible osteomyelitis.  There is exposed bone.  Podiatry therefore sent him over to the hospital for MRI, IV antibiotics and possible surgery.  At this point he complains of no pain.  Patient also insists he takes his medication including his insulin..    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 Continue IV antibiotics vancomycin and cefepime.  Appreciate podiatry input.  Since no surgery planned for today we will keep him on a carb modified diet and make him n.p.o. after midnight.  #2 type 2 diabetes he has been having multiple hypoglycemic episodes today.  Hold any insulin today.  #3 CKD stage IIIa complicated with diabetic nephropathy monitor renal functions on vancomycin  #4 history of essential  hypertension on HCTZ and ramipril  #5 hyperlipidemia on statin   Estimated body mass index is 43.26 kg/m as calculated from the following:   Height as of this encounter: 6' (1.829 m).   Weight as of 08/20/20: 144.7 kg.  DVT prophylaxis: Lovenox  code Status: Full code  family Communication: None at bedside Disposition Plan:  Status is: Inpatient  Remains inpatient appropriate because:IV treatments appropriate due to intensity of illness or inability to take PO  Dispo: The patient is from: Home              Anticipated d/c is to: Home              Patient currently is not medically stable to d/c.   Difficult to place patient No       Consultants:  podiatry  Procedures: None Antimicrobials: Vancomycin and cefepime Subjective: He is resting in bed he has no new complaints the right foot is covered with dressing MRI done results pending Multiple hypoglycemic episodes he received apple juice now on D5 normal saline and he ate lunch  Objective: Vitals:   08/31/20 2000 09/01/20 0007 09/01/20 0453 09/01/20 0958  BP:  (!) 147/92 (!) 142/85 (!) 161/76  Pulse:  64 68 60  Resp:  '18 20 18  '$ Temp:    98.1 F (36.7 C)  TempSrc:    Oral  SpO2:  100% 98% 100%  Height: 6' (1.829 m)       Intake/Output Summary (Last 24 hours) at 09/01/2020 1405 Last data filed at 09/01/2020 1100 Gross per 24 hour  Intake 1373.77 ml  Output  200 ml  Net 1173.77 ml   There were no vitals filed for this visit.  Examination:  General exam: Appears calm and comfortable  Respiratory system: Clear to auscultation. Respiratory effort normal. Cardiovascular system: S1 & S2 heard, RRR. No JVD, murmurs, rubs, gallops or clicks. No pedal edema. Gastrointestinal system: Abdomen is nondistended, soft and nontender. No organomegaly or masses felt. Normal bowel sounds heard. Central nervous system: Alert and oriented. No focal neurological deficits. Extremities: Right foot covered with dressing  skin: No  rashes, lesions or ulcers Psychiatry: Judgement and insight appear normal. Mood & affect appropriate.     Data Reviewed: I have personally reviewed following labs and imaging studies  CBC: Recent Labs  Lab 08/31/20 2315 09/01/20 0331  WBC 8.3 7.5  HGB 11.2* 10.7*  HCT 36.0* 34.3*  MCV 82.2 82.5  PLT 341 [id number]   Basic Metabolic Panel: Recent Labs  Lab 08/31/20 2315 09/01/20 0331  NA  --  140  K  --  3.8  CL  --  108  CO2  --  26  GLUCOSE  --  95  BUN  --  11  CREATININE 1.16 1.11  CALCIUM  --  8.9   GFR: Estimated Creatinine Clearance: 117.6 mL/min (by C-G formula based on SCr of 1.11 mg/dL). Liver Function Tests: Recent Labs  Lab 09/01/20 0331  AST 19  ALT 30  ALKPHOS 75  BILITOT 0.8  PROT 7.1  ALBUMIN 3.0*   No results for input(s): LIPASE, AMYLASE in the last 168 hours. No results for input(s): AMMONIA in the last 168 hours. Coagulation Profile: No results for input(s): INR, PROTIME in the last 168 hours. Cardiac Enzymes: No results for input(s): CKTOTAL, CKMB, CKMBINDEX, TROPONINI in the last 168 hours. BNP (last 3 results) No results for input(s): PROBNP in the last 8760 hours. HbA1C: Recent Labs    08/31/20 2315  HGBA1C 6.5*   CBG: Recent Labs  Lab 09/01/20 0009 09/01/20 0818 09/01/20 0901 09/01/20 1128 09/01/20 1201  GLUCAP 107* 62* 79 56* 72   Lipid Profile: No results for input(s): CHOL, HDL, LDLCALC, TRIG, CHOLHDL, LDLDIRECT in the last 72 hours. Thyroid Function Tests: No results for input(s): TSH, T4TOTAL, FREET4, T3FREE, THYROIDAB in the last 72 hours. Anemia Panel: No results for input(s): VITAMINB12, FOLATE, FERRITIN, TIBC, IRON, RETICCTPCT in the last 72 hours. Sepsis Labs: No results for input(s): PROCALCITON, LATICACIDVEN in the last 168 hours.  Recent Results (from the past 240 hour(s))  SARS CORONAVIRUS 2 (TAT 6-24 HRS) Nasopharyngeal Nasopharyngeal Swab     Status: None   Collection Time: 08/31/20 11:42 PM   Specimen:  Nasopharyngeal Swab  Result Value Ref Range Status   SARS Coronavirus 2 NEGATIVE NEGATIVE Final    Comment: (NOTE) SARS-CoV-2 target nucleic acids are NOT DETECTED.  The SARS-CoV-2 RNA is generally detectable in upper and lower respiratory specimens during the acute phase of infection. Negative results do not preclude SARS-CoV-2 infection, do not rule out co-infections with other pathogens, and should not be used as the sole basis for treatment or other patient management decisions. Negative results must be combined with clinical observations, patient history, and epidemiological information. The expected result is Negative.  Fact Sheet for Patients: SugarRoll.be  Fact Sheet for Healthcare Providers: https://www.[employer] [employer].com/  This test is not yet approved or cleared by the [place] FDA and  has been authorized for detection and/or diagnosis of SARS-CoV-2 by FDA under an Emergency Use Authorization (EUA). This EUA will remain  in effect (  meaning this test can be used) for the duration of the COVID-19 declaration under Se ction 564(b)(1) of the Act, 21 U.S.C. section 360bbb-3(b)(1), unless the authorization is terminated or revoked sooner.  Performed at [facility], [place] [address], [place], [place] [address]          Radiology Studies: DG Foot Complete Right  Result Date: 08/31/2020 Please see detailed radiograph report in office note.       Scheduled Meds:  ascorbic acid  500 mg Oral Daily   aspirin EC  81 mg Oral Daily   atorvastatin  60 mg Oral Daily   dextrose       enoxaparin (LOVENOX) injection  40 mg Subcutaneous Q24H   fenofibrate  54 mg Oral Daily   hydrochlorothiazide  12.5 mg Oral Daily   insulin aspart  0-20 Units Subcutaneous TID WC   insulin aspart  0-5 Units Subcutaneous QHS   multivitamin with minerals  1 tablet Oral BID   omega-3 acid ethyl esters  1 g Oral Daily   pyridoxine  100 mg  Oral BID   ramipril  5 mg Oral Daily   vitamin B-12  5,000 mcg Oral Daily   Continuous Infusions:  ceFEPime (MAXIPIME) IV Stopped (09/01/20 0932)   dextrose 5 % and 0.9% NaCl 75 mL/hr at 09/01/20 1100   vancomycin       LOS: 1 day    Time spent: 45 min    Georgette Shell, MD 09/01/2020, 2:05 PM

## 2020-09-01 NOTE — Consult Note (Signed)
Reason for Consult:right foot osteomyelitis Referring Physician: Dr Garba  [name] is an 50 y.o. male.  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  He is known to my partner Dr. March Rummage in the outpatient setting for continue ongoing wound care of the plantar ulceration as well as new ulceration on the hallux and third toe.  The wound worsen and admission was recommended for IV antibiotics and possible surgical intervention.  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   (06-17-2019 pt stated has been stable with no changes and followed by pcp)   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[place])    11-06-2016  lower extremity angiography (dr [name])  mil tibial disease w/ occlusion of the distal peroneal artery and dorsalis pedis artery but has widely patent posterior tibial and anterior tibial arterties   Type 2 diabetes mellitus treated with insulin ([place])    followed by pcp   (06-17-2019  pt stated checks blood sugar daily in am,  fasting sugar-- average 71)   Vitamin B 12 deficiency     Past Surgical History:  Procedure Laterality Date   AMPUTATION Right 07/31/2016   Procedure: AMPUTATION RIGHT FIFTH RAY AND APPLICATION OF WOUND [place];  Surgeon: Dorna Leitz, MD;  Location: [facility];  Service: Orthopedics;  Laterality: Right;   AMPUTATION Right 08/08/2016   Procedure: RAY AMPUTATION RIGHT FOURTH METATARSAL, DEBRIDEMENT OF SUBQ TISSUE,BONE AND SKIN WITH WOUND CLOSURE;  Surgeon: Dorna Leitz, MD;  [facility];  Service: Orthopedics;  Laterality: Right;   AMPUTATION TOE Left 06/21/2019   Procedure: AMPUTATION TOE SECOND LEFT, DEBRIDEMENT OF LEFT GREAT TOE;  Surgeon: [name], DPM;  Location: [facility];  Service: Podiatry;  Laterality: Left;   ANAL FISSURE REPAIR  [id number]   APPLICATION OF A-CELL OF EXTREMITY Right 08/13/2016   Procedure: APPLICATION OF A-CELL AND VAC;  Surgeon: Wallace Going, DO;  [facility];  Service: Plastics;  Laterality: Right;   APPLICATION OF A-CELL OF EXTREMITY Right 07/16/2017   Procedure: CELLERATE PLACEMENT;  Surgeon: Wallace Going, DO;  Location: [facility];  Service: Plastics;  Laterality: Right;   APPLICATION OF WOUND VAC Right 09/18/2016   Procedure: APPLICATION OF WOUND VAC (PT. WILL BRING HIS OWN);  Surgeon: Wallace Going, DO;  Location: [facility];  Service: Plastics;  Laterality: Right;   APPLICATION OF WOUND VAC Right 10/30/2016   Procedure: APPLICATION OF WOUND VAC;  Surgeon: Wallace Going, DO;  Location: [facility];  Service: Plastics;  Laterality: Right;   APPLICATION OF WOUND VAC Right 12/01/2016   Procedure: APPLICATION OF WOUND VAC;  Surgeon: Wallace Going, DO;  [facility];  Service: Clinical cytogeneticist;  Laterality: Right;   BONE BIOPSY Left 01/04/2018   Procedure: LEFT FOOT BONE BIOPSY;  Surgeon: [name], DPM;  Location: [facility];  Service: Podiatry;  Laterality: Left;   CIRCUMCISION  2006   DEBRIDEMENT  FOOT Right 07/16/2017   I & D EXTREMITY Right 09/18/2016   Procedure: IRRIGATION AND DEBRIDEMENT OF RIGHT LATERAL DIABLTIC FOOT ULCER;  Surgeon: Wallace Going, DO;  Location: [facility];  Service:  Plastics;  Laterality: Right;   I & D EXTREMITY Right 03/11/2017   Procedure: IRRIGATION AND DEBRIDEMENT OF RIGHT LATERAL FOOT WOUND WITH CELLERATE COLLAGEN;  Surgeon: Wallace Going, DO;  Location: [facility];  Service: Plastics;  Laterality: Right;   INCISION AND DRAINAGE OF WOUND Right 08/13/2016   Procedure: IRRIGATION AND DEBRIDEMENT WOUND RIGHT FOOT;  Surgeon: Wallace Going, DO;  [facility];  Service: Plastics;  Laterality: Right;   IR PATIENT EVAL TECH 0-60 MINS  01/12/2020   LAPAROSCOPIC CHOLECYSTECTOMY  09-09-2006   dr Zella Richer   LOWER EXTREMITY ANGIOGRAPHY N/A 10/27/2016   Procedure: Lower Extremity Angiography;  Surgeon: Angelia Mould, MD;  Location: [facility];  Service: Cardiovascular;  Laterality: N/A;   MASS EXCISION Right 10/30/2016   Procedure: EXCISION RIGHT FOOT WOUND WITH VAC PLACEMENT;  Surgeon: Wallace Going, DO;  Location: [facility];  Service: Plastics;  Laterality: Right;   MASS EXCISION Right 07/16/2017   Procedure: EXCISION OF RIGHT FOOT WOUND WITH CELLERATE PLACEMENT;  Surgeon: Wallace Going, DO;  Location: [facility];  Service: Plastics;  Laterality: Right;   METATARSAL HEAD EXCISION Left 01/04/2018   Procedure: 5TH METATARSAL RESECTION,  WOUND CLOSURE,  Adjacent Tissue Transfer.;  Surgeon: [name], DPM;  Location: [facility];  Service: Podiatry;  Laterality: Left;   SKIN SPLIT GRAFT Right 12/01/2016   Procedure: SKIN GRAFT SPLIT THICKNESS TO RIGHT LATERAL FOOT WOUND 9 X 3 CM;  Surgeon: Wallace Going, DO;  [facility];  Service: Plastics;  Laterality: Right;   wisdom teeth extracted  1984    Family History  Problem Relation Age of Onset   Sarcoidosis Mother    Stroke Father        early 59s. former smoker   Diabetes Father    Hypertension Father    Hypertension Sister    Diabetes Paternal Grandmother    Congenital heart disease Paternal Grandmother         died of complications   Healthy Daughter    Colon cancer Neg Hx    Colon polyps Neg Hx    Esophageal cancer Neg Hx    Rectal cancer Neg Hx    Stomach cancer Neg Hx     Social History:  reports that he has never smoked. He has never used smokeless tobacco. He reports that he does not drink alcohol and does not use drugs.  Allergies: No Known Allergies  Medications: I have reviewed the patient's current medications.  Results for orders placed or performed during the hospital encounter of 08/31/20 (from the past 48 hour(s))  Hemoglobin A1c     Status: Abnormal   Collection Time: 08/31/20 11:15 PM  Result Value Ref Range   Hgb A1c MFr Bld 6.5 (H) 4.8 - 5.6 %    Comment: (NOTE) Pre diabetes:          5.7%-6.4%  Diabetes:              >6.4%  Glycemic control for   <7.0% adults with diabetes    Mean Plasma Glucose 139.85 mg/dL    Comment: Performed at [facility] [address], [place], [place] [address]  HIV Antibody (routine testing w rflx)     Status: None   Collection Time: 08/31/20 11:15 PM  Result Value Ref Range   HIV Screen 4th Generation wRfx Non Reactive Non Reactive    Comment: Performed at [facility], [place] [address], [place], [place] [address]  CBC     Status: Abnormal   Collection Time: 08/31/20 11:15 PM  Result Value Ref Range   WBC 8.3 4.0 - 10.5 K/uL   RBC 4.38 4.22 - 5.81 MIL/uL   Hemoglobin 11.2 (L) 13.0 - 17.0 g/dL   HCT 36.0 (L) 39.0 - 52.0 %   MCV 82.2 80.0 - 100.0 fL   MCH 25.6 (L) 26.0 - 34.0 pg   MCHC 31.1 30.0 - 36.0 g/dL   RDW 13.1 11.5 - 15.5 %   Platelets 341 150 - 400 K/uL   nRBC 0.0 0.0 - 0.2 %    Comment: Performed at [facility], [place] [address], [place], [place] [address]  Creatinine, serum     Status: None   Collection Time: 08/31/20 11:15 PM  Result Value Ref Range   Creatinine, Ser 1.16 0.61 - 1.24 mg/dL   GFR, Estimated >60 >60 mL/min    Comment: (NOTE) Calculated using the CKD-EPI  Creatinine Equation (2021) Performed at [name], [place] [address], [place], [place] [address]   SARS CORONAVIRUS 2 (TAT 6-24 HRS) Nasopharyngeal Nasopharyngeal Swab     Status: None   Collection Time: 08/31/20 11:42 PM   Specimen: Nasopharyngeal Swab  Result Value Ref Range   SARS Coronavirus 2 NEGATIVE NEGATIVE    Comment: (NOTE) SARS-CoV-2 target nucleic acids are NOT DETECTED.  The SARS-CoV-2 RNA is generally detectable in upper and lower respiratory specimens during the acute phase of infection. Negative results do not preclude SARS-CoV-2 infection, do not rule out co-infections with other pathogens, and should not be used as the sole basis for treatment or other patient management decisions. Negative results must be combined with clinical observations, patient history, and epidemiological information. The expected result is Negative.  Fact Sheet for Patients: SugarRoll.be  Fact Sheet for Healthcare Providers: https://www.woods-mathews.com/  This test is not yet approved or cleared by the [place] FDA and  has been authorized for detection and/or diagnosis of SARS-CoV-2 by FDA under an Emergency Use Authorization (EUA). This EUA will remain  in effect (meaning this test can be used) for the duration of the COVID-19 declaration under Se ction 564(b)(1) of the Act, 21 U.S.C. section 360bbb-3(b)(1), unless the authorization is terminated or revoked sooner.  Performed at [facility], [place] [address] [place], [place] [address]   Glucose, capillary     Status: Abnormal   Collection Time: 09/01/20 12:09 AM  Result Value Ref Range   Glucose-Capillary 107 (H) 70 - 99 mg/dL    Comment: Glucose reference range applies only to samples taken after fasting for at least 8 hours.  CBC     Status: Abnormal   Collection Time: 09/01/20  3:31 AM  Result Value Ref Range   WBC 7.5 4.0 - 10.5 K/uL   RBC 4.16 (L)  4.22 - 5.81 MIL/uL   Hemoglobin 10.7 (L) 13.0 - 17.0 g/dL   HCT 34.3 (L) 39.0 - 52.0 %   MCV  82.5 80.0 - 100.0 fL   MCH 25.7 (L) 26.0 - 34.0 pg   MCHC 31.2 30.0 - 36.0 g/dL   RDW 13.2 11.5 - 15.5 %   Platelets 327 150 - 400 K/uL   nRBC 0.0 0.0 - 0.2 %    Comment: Performed at [facility], [place] [address] [place], [place] [address]  Comprehensive metabolic panel     Status: Abnormal   Collection Time: 09/01/20  3:31 AM  Result Value Ref Range   Sodium 140 135 - 145 mmol/L   Potassium 3.8 3.5 - 5.1 mmol/L   Chloride 108 98 - 111 mmol/L   CO2 26 22 - 32 mmol/L   Glucose, Bld 95 70 - 99 mg/dL    Comment: Glucose reference range applies only to samples taken after fasting for at least 8 hours.   BUN 11 6 - 20 mg/dL   Creatinine, Ser 1.11 0.61 - 1.24 mg/dL   Calcium 8.9 8.9 - 10.3 mg/dL   Total Protein 7.1 6.5 - 8.1 g/dL   Albumin 3.0 (L) 3.5 - 5.0 g/dL   AST 19 15 - 41 U/L   ALT 30 0 - 44 U/L   Alkaline Phosphatase 75 38 - 126 U/L   Total Bilirubin 0.8 0.3 - 1.2 mg/dL   GFR, Estimated >60 >60 mL/min    Comment: (NOTE) Calculated using the CKD-EPI Creatinine Equation (2021)    Anion gap 6 5 - 15    Comment: Performed at [facility], [place] [address] [place], [place] [address]  Glucose, capillary     Status: Abnormal   Collection Time: 09/01/20  8:18 AM  Result Value Ref Range   Glucose-Capillary 62 (L) 70 - 99 mg/dL    Comment: Glucose reference range applies only to samples taken after fasting for at least 8 hours.  Glucose, capillary     Status: None   Collection Time: 09/01/20  9:01 AM  Result Value Ref Range   Glucose-Capillary 79 70 - 99 mg/dL    Comment: Glucose reference range applies only to samples taken after fasting for at least 8 hours.  Glucose, capillary     Status: Abnormal   Collection Time: 09/01/20 11:28 AM  Result Value Ref Range   Glucose-Capillary 56 (L) 70 - 99 mg/dL    Comment: Glucose reference range applies only  to samples taken after fasting for at least 8 hours.    DG Foot Complete Right  Result Date: 08/31/2020 Please see detailed radiograph report in office note.   Review of Systems  Constitutional:  Negative for chills, fever and malaise/fatigue.  All other systems reviewed and are negative. Blood pressure (!) 161/76, pulse 60, temperature 98.1 F (36.7 C), temperature source Oral, resp. rate 18, height 6' (1.829 m), SpO2 100 %.  Vitals:   09/01/20 0453 09/01/20 0958  BP: (!) 142/85 (!) 161/76  Pulse: 68 60  Resp: 20 18  Temp:  98.1 F (36.7 C)  SpO2: 98% 100%    General AA&O x3. Normal mood and affect.  Vascular Dorsalis pedis and posterior tibial pulses  present 2+ right  Capillary refill normal to all digits. Pedal hair growth diminished.  Neurologic Epicritic sensation grossly absent.  Dermatologic (Wound)        Orthopedic: Motor intact BLE.    Assessment/Plan:  Right third toe osteomyelitis -Imaging: Studies independently reviewed.  MRI is ordered but not yet completed -Antibiotics: Continue broad-spectrum for now -WB Status: WBAT RLE for now -  Wound Care: Daily dressing changes with Xeroform and DSD, order placed -Surgical Plan: We will likely need at least third toe amputation.  I discussed electing for transmetatarsal amputation as I expect hallux or second toe ulceration and osteomyelitis is at high chance of developing now that he is already lost 2 rays.  He will consider this.  We will discuss this further if the MRI is completed  Criselda Peaches 09/01/2020, 11:47 AM   Best available via secure chat for questions or concerns.

## 2020-09-01 NOTE — Plan of Care (Signed)
I reviewed the MRI report and images personally and discussed the findings with the patient. Understandably the prospect of major amputation is a life changing decision and he would like to meet with me and his wife tomorrow, we have this planned for 1:30pm tomorrow afternoon. Will hold off on surgery/amputation until Monday   Lanae Crumbly, [facility] 09/01/2020

## 2020-09-01 NOTE — Plan of Care (Signed)

## 2020-09-02 ENCOUNTER — Encounter (HOSPITAL_COMMUNITY)
Admission: AD | Disposition: A | Discharge status: Home Health | Payer: Self-pay | Source: Home / Self Care | Attending: Internal Medicine

## 2020-09-02 DIAGNOSIS — Z89421 Acquired absence of other right toe(s): Secondary | ICD-10-CM | POA: Diagnosis not present

## 2020-09-02 DIAGNOSIS — E11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ical complication: Secondary | ICD-10-CM

## 2020-09-02 DIAGNOSIS — M86671 Other chronic osteomyelitis, right ankle and foot: Secondary | ICD-10-CM | POA: Diagnosis not present

## 2020-09-02 LAB — SURGICAL PCR SCREEN
MRSA, PCR: NEGATIVE
Staphylococcus aureus: NEGATIVE

## 2020-09-02 LAB — CREATININE, SERUM
Creatinine, Ser: 1.17 mg/dL (ref 0.61–1.24)
GFR, Estimated: >60 mL/min (ref >60)

## 2020-09-02 LAB — GLUCOSE, CAPILLARY
Glucose-Capillary: 135 mg/dL — ABNORMAL HIGH (ref 70–99)
Glucose-Capillary: 99 mg/dL (ref 70–99)
Glucose-Capillary: 74 mg/dL (ref 70–99)
Glucose-Capillary: 102 mg/dL — ABNORMAL HIGH (ref 70–99)
Glucose-Capillary: 117 mg/dL — ABNORMAL HIGH (ref 70–99)

## 2020-09-02 SURGERY — AMPUTATION, TOE
Anesthesia: Monitor Anesthesia Care | Site: Toe | Laterality: Right

## 2020-09-02 MED ORDER — HYDRALAZINE HCL 50 MG PO TABS
50.0000 mg | ORAL_TABLET | Freq: Three times a day (TID) | ORAL | Status: DC
  Administered 2020-09-02 – 2020-09-08 (×18): 50 mg via ORAL
  Filled 2020-09-02 (×18): qty 1

## 2020-09-02 NOTE — Progress Notes (Signed)
Subjective:  Patient ID: [name], male    DOB: 1970/04/09,  MRN: [id number]  No change in symptoms yesterday he feels about the same.  His wife is here today in the room with him  Negative for chest pain and shortness of breath Chest pain: no Shortness of breath: no Fever: no Constitutional signs: no Review of all other systems is negative Objective:   Vitals:   09/02/20 0533 09/02/20 1438  BP: (!) 165/85 (!) 152/92  Pulse: 63 74  Resp: 17   Temp: 98 F (36.7 C) 98.4 F (36.9 C)  SpO2: 100% 99%   General AA&O x3. Normal mood and affect.  Vascular Foot is warm and well-perfused  Neurologic Epicritic sensation grossly reduced.  Dermatologic Dressings clean dry and intact  Orthopedic: Motor and muscle strength function intact   Study Result  Narrative & Impression  CLINICAL DATA:  Diabetic foot wound   EXAM: MRI OF THE RIGHT FOREFOOT WITHOUT AND WITH CONTRAST   TECHNIQUE: Multiplanar, multisequence MR imaging of the right forefoot was performed before and after the administration of intravenous contrast.   CONTRAST:  19m GADAVIST GADOBUTROL 1 MMOL/ML IV SOLN   COMPARISON:  X-ray 08/31/2020   FINDINGS: Status post fourth and fifth ray resection. Broad soft tissue wound/ulceration along the lateral aspect of the forefoot. Marked bone marrow edema with confluent low T1 marrow signal throughout the proximal and middle phalanx of the right third toe (series 7, images 15-16), compatible with acute osteomyelitis. Findings suggestive of reactive osteitis involving the distal phalanx of the third toe. Minimal subcortical marrow edema involving the third metatarsal head without evidence of acute osteomyelitis.   Marked bone marrow edema and confluent low T1 signal throughout the cuboid with erosive changes along its plantar margin (series 7, images 13-17), compatible with acute osteomyelitis.   Bone marrow edema throughout the residual fourth metatarsal  base with subtle erosive changes along its lateral margin, also suggestive of osteomyelitis.   Bone marrow edema is present throughout the distal phalanx of the great toe with intermediate T1 marrow signal in the distal tuft concerning for early acute osteomyelitis.   Similar findings of Charcot arthropathy within the midfoot.   Chronic denervation changes with probable myositis of the intrinsic foot musculature. Diffusely edematous appearance of the soft tissues. There is ill-defined fluid along the lateral margin of the forefoot. No well-defined or rim enhancing fluid collection.   IMPRESSION: 1. Acute osteomyelitis involving the proximal and middle phalanx of the right third toe. Reactive osteitis involving the distal phalanx of the third toe. 2. Acute osteomyelitis of the cuboid with erosive changes along its plantar margin. 3. Acute osteomyelitis involving the residual fourth metatarsal base. 4. Findings suggestive of early acute osteomyelitis involving the distal phalanx of the great toe. 5. Charcot arthropathy within the midfoot. 6. Broad soft tissue wound/ulceration along the lateral aspect of the forefoot. Ill-defined fluid along the lateral forefoot without well-defined or rim enhancing fluid collection.   Assessment & Plan:  Patient was evaluated and treated and all questions answered.  Third toe osteomyelitis right foot with diabetic foot infection -I again reviewed the findings of the MRI with the patient and his wife in detail.  We had a long discussion regarding his current diagnosis and prognosis his previous history as well as what options are he has going forward for limb salvage.  We discussed the current infection and whether this was a previous residual or new occurrence of reinfection from his open wounds which  is the etiology that I favor.  We discussed what factors have contributed to poor wound healing in him thus far including his acquired deformity from  loss of the fifth ray.  We discussed social factors such as his job and the fact that he has to wear steel toe boots which makes this difficult and the chance of re-ulceration high.  Currently they are adamantly opposed to any midfoot or major amputation and would like to try all possible attempts of limb salvage prior to considering this which I understand.  We discussed that the infection within the third toe was quite significant and I do not think the toe is likely to survive any attempts of salvage with antibiotics.  I recommend amputation of the third toe which the are agreeable to.  At the same time we will biopsy and culture the hallux cuboid and fourth metatarsal base to confirm the MRI findings and guide antibiotic therapy.  We also discussed the option and possibility of bone debridement with antibiotic absorbable beads into the cuboid to increase our chance of success -I will discuss the above with his primary podiatrist Dr. March Rummage my partner who they have a longstanding relationship with an prefer any surgical intervention be with him. -I recommended ID consult to guide antibiotic therapy following obtaining cultures and biopsies.  We will send path on a rush basis to expedite his postsurgical discharge -Plan for OR tomorrow   I spent approximately 50 minutes today with the patient and his wife discussing the above and counseling them on his treatment, greater than 50% of this time was used for counseling.  Criselda Peaches, DPM  Accessible via secure chat for questions or concerns.

## 2020-09-02 NOTE — H&P (View-Only) (Signed)
Subjective:  Patient ID: [name], male    DOB: 11/07/1970,  MRN: [id number]  No change in symptoms yesterday he feels about the same.  His wife is here today in the room with him  Negative for chest pain and shortness of breath Chest pain: no Shortness of breath: no Fever: no Constitutional signs: no Review of all other systems is negative Objective:   Vitals:   09/02/20 0533 09/02/20 1438  BP: (!) 165/85 (!) 152/92  Pulse: 63 74  Resp: 17   Temp: 98 F (36.7 C) 98.4 F (36.9 C)  SpO2: 100% 99%   General AA&O x3. Normal mood and affect.  Vascular Foot is warm and well-perfused  Neurologic Epicritic sensation grossly reduced.  Dermatologic Dressings clean dry and intact  Orthopedic: Motor and muscle strength function intact   Study Result  Narrative & Impression  CLINICAL DATA:  Diabetic foot wound   EXAM: MRI OF THE RIGHT FOREFOOT WITHOUT AND WITH CONTRAST   TECHNIQUE: Multiplanar, multisequence MR imaging of the right forefoot was performed before and after the administration of intravenous contrast.   CONTRAST:  38m GADAVIST GADOBUTROL 1 MMOL/ML IV SOLN   COMPARISON:  X-ray 08/31/2020   FINDINGS: Status post fourth and fifth ray resection. Broad soft tissue wound/ulceration along the lateral aspect of the forefoot. Marked bone marrow edema with confluent low T1 marrow signal throughout the proximal and middle phalanx of the right third toe (series 7, images 15-16), compatible with acute osteomyelitis. Findings suggestive of reactive osteitis involving the distal phalanx of the third toe. Minimal subcortical marrow edema involving the third metatarsal head without evidence of acute osteomyelitis.   Marked bone marrow edema and confluent low T1 signal throughout the cuboid with erosive changes along its plantar margin (series 7, images 13-17), compatible with acute osteomyelitis.   Bone marrow edema throughout the residual fourth metatarsal  base with subtle erosive changes along its lateral margin, also suggestive of osteomyelitis.   Bone marrow edema is present throughout the distal phalanx of the great toe with intermediate T1 marrow signal in the distal tuft concerning for early acute osteomyelitis.   Similar findings of Charcot arthropathy within the midfoot.   Chronic denervation changes with probable myositis of the intrinsic foot musculature. Diffusely edematous appearance of the soft tissues. There is ill-defined fluid along the lateral margin of the forefoot. No well-defined or rim enhancing fluid collection.   IMPRESSION: 1. Acute osteomyelitis involving the proximal and middle phalanx of the right third toe. Reactive osteitis involving the distal phalanx of the third toe. 2. Acute osteomyelitis of the cuboid with erosive changes along its plantar margin. 3. Acute osteomyelitis involving the residual fourth metatarsal base. 4. Findings suggestive of early acute osteomyelitis involving the distal phalanx of the great toe. 5. Charcot arthropathy within the midfoot. 6. Broad soft tissue wound/ulceration along the lateral aspect of the forefoot. Ill-defined fluid along the lateral forefoot without well-defined or rim enhancing fluid collection.   Assessment & Plan:  Patient was evaluated and treated and all questions answered.  Third toe osteomyelitis right foot with diabetic foot infection -I again reviewed the findings of the MRI with the patient and his wife in detail.  We had a long discussion regarding his current diagnosis and prognosis his previous history as well as what options are he has going forward for limb salvage.  We discussed the current infection and whether this was a previous residual or new occurrence of reinfection from his open wounds which  is the etiology that I favor.  We discussed what factors have contributed to poor wound healing in him thus far including his acquired deformity from  loss of the fifth ray.  We discussed social factors such as his job and the fact that he has to wear steel toe boots which makes this difficult and the chance of re-ulceration high.  Currently they are adamantly opposed to any midfoot or major amputation and would like to try all possible attempts of limb salvage prior to considering this which I understand.  We discussed that the infection within the third toe was quite significant and I do not think the toe is likely to survive any attempts of salvage with antibiotics.  I recommend amputation of the third toe which the are agreeable to.  At the same time we will biopsy and culture the hallux cuboid and fourth metatarsal base to confirm the MRI findings and guide antibiotic therapy.  We also discussed the option and possibility of bone debridement with antibiotic absorbable beads into the cuboid to increase our chance of success -I will discuss the above with his primary podiatrist Dr. March Rummage my partner who they have a longstanding relationship with an prefer any surgical intervention be with him. -I recommended ID consult to guide antibiotic therapy following obtaining cultures and biopsies.  We will send path on a rush basis to expedite his postsurgical discharge -Plan for OR tomorrow   I spent approximately 50 minutes today with the patient and his wife discussing the above and counseling them on his treatment, greater than 50% of this time was used for counseling.  Criselda Peaches, DPM  Accessible via secure chat for questions or concerns.

## 2020-09-02 NOTE — Progress Notes (Signed)
PROGRESS NOTE    [name]  [id number] DOB: [birth year] DOA: 08/31/2020 PCP: Marin Olp, MD   Brief Narrative: HPI per Dr. Jonelle Sidle on 08/31/2020  [name] is a 50 y.o. male with medical history significant of uncontrolled diabetes, hypertension, diabetic neuropathy, diabetic nephropathy, pituitary macroadenoma adenoma, peripheral vascular disease status post amputation of the right fifth and fourth toes 4 years ago.  Patient is here after reevaluation today by podiatry indicating worsening of his ulcer.  He has a wound at the right lateral plantar foot below the third digit.  It measures 1 x 0.4 x 0.7.  It is macerated with moderate amount of serosanguineous exudate revealing right third toe infection possible osteomyelitis.  There is exposed bone.  Podiatry therefore sent him over to the hospital for MRI, IV antibiotics and possible surgery.  At this point he complains of no pain.  Patient also insists he takes his medication including his insulin..    Assessment & Plan:   Principal Problem:   Osteomyelitis ([place]) Active Problems:   Type II diabetes mellitus with neurological manifestations (HCC)   Hyperlipidemia associated with type 2 diabetes mellitus ([place])   Hypertension associated with diabetes (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  Acute osteomyelitis involving the residual fourth metatarsal base.  Early acute osteomyelitis involving the distal phalanx of the great toe.  Charcot's arthropathy within the midfoot.  Broad soft tissue wound  and ulceration along the lateral aspect of the forefoot ill-defined fluid along the lateral forefoot without well-defined or rim-enhancing fluid collection.  Continue IV antibiotics vancomycin and cefepime.  Appreciate podiatry input.  Family to meet with Dr. Sherryle Lis today to discuss options regarding surgery.  #2 type 2 diabetes with hyperglycemia -resolved. CBG (last 3)  Recent Labs    09/01/20 1550 09/01/20 2139 09/02/20 0705  GLUCAP 106* 142* 135*    #3 CKD stage IIIa complicated with diabetic nephropathy monitor renal functions on vancomycin.  Renal function stable.  I have held the ramipril HCTZ and naproxen while he is on vancomycin to minimize nephrotoxicity.  #4 history of essential hypertension on HCTZ and ramipril at home these have been on hold.  Blood pressure 165/85.  I will start him on hydralazine.  #5 hyperlipidemia on statin   Estimated body mass index is 42.9 kg/m as calculated from the following:   Height as of this encounter: 6' (1.829 m).   Weight as of this encounter: 143.5 kg.  DVT prophylaxis: Lovenox  code Status: Full code  family Communication: None at bedside Disposition Plan:  Status is: Inpatient  Remains inpatient appropriate because:IV treatments appropriate due to intensity of illness or inability to take PO  Dispo: The patient is from: Home              Anticipated d/c is to: Home              Patient currently is not medically stable to d/c.   Difficult to place patient No       Consultants:  podiatry  Procedures: None Antimicrobials: Vancomycin and cefepime  Subjective:  patient resting in bed in no acute distress anxious to know what is going to happen and he discussed with Dr. Domenic Polite and his family.  No new events overnight  Objective: Vitals:   09/01/20 0958 09/01/20 1300 09/01/20 2134 09/02/20 0533  BP: (!) 161/76  (!) 153/84 (!) 165/85  Pulse: 60  66 63  Resp: '18  17 17  '$ Temp: 98.1 F (36.7 C)  98.1 F (36.7 C) 98 F  (36.7 C)  TempSrc: Oral  Oral Oral  SpO2: 100%  98% 100%  Weight:  (!) 143.5 kg    Height:        Intake/Output Summary (Last 24 hours) at 09/02/2020 1043 Last data filed at 09/02/2020 0600 Gross per 24 hour  Intake 2560.53 ml  Output --  Net 2560.53 ml    Filed Weights   09/01/20 1300  Weight: (!) 143.5 kg    Examination:  General exam: Appears calm and comfortable  Respiratory system: Clear to auscultation. Respiratory effort normal. Cardiovascular system: S1 & S2 heard, RRR. No JVD, murmurs, rubs, gallops or clicks. No pedal edema. Gastrointestinal system: Abdomen is nondistended, soft and nontender. No organomegaly or masses felt. Normal bowel sounds heard. Central nervous system: Alert and oriented. No focal neurological deficits. Extremities: Right foot covered with dressing  skin: No rashes, lesions or ulcers Psychiatry: Judgement and insight appear normal. Mood & affect appropriate.     Data Reviewed: I have personally reviewed following labs and imaging studies  CBC: Recent Labs  Lab 08/31/20 2315 09/01/20 0331  WBC 8.3 7.5  HGB 11.2* 10.7*  HCT 36.0* 34.3*  MCV 82.2 82.5  PLT 341 [id number]    Basic Metabolic Panel: Recent Labs  Lab 08/31/20 2315 09/01/20 0331 09/02/20 0323  NA  --  140  --   K  --  3.8  --   CL  --  108  --   CO2  --  26  --   GLUCOSE  --  95  --   BUN  --  11  --   CREATININE 1.16 1.11 1.17  CALCIUM  --  8.9  --     GFR: Estimated Creatinine Clearance: 111.1 mL/min (by C-G formula based on SCr of 1.17 mg/dL). Liver Function Tests: Recent Labs  Lab 09/01/20 0331  AST 19  ALT 30  ALKPHOS 75  BILITOT 0.8  PROT 7.1  ALBUMIN 3.0*    No results for input(s): LIPASE, AMYLASE in the last 168 hours. No results for input(s): AMMONIA in the last 168 hours. Coagulation Profile: No results for input(s): INR, PROTIME in the last 168 hours. Cardiac Enzymes: No results for input(s): CKTOTAL, CKMB, CKMBINDEX, TROPONINI in the last  168 hours. BNP (last 3 results) No results for input(s): PROBNP in the last 8760 hours. HbA1C: Recent Labs    08/31/20 2315  HGBA1C 6.5*    CBG: Recent Labs  Lab 09/01/20 1128 09/01/20 1201 09/01/20 1550 09/01/20 2139 09/02/20 0705  GLUCAP 56* 72 106* 142* 135*    Lipid Profile: No results for input(s): CHOL, HDL, LDLCALC, TRIG, CHOLHDL, LDLDIRECT in the last 72 hours. Thyroid Function Tests: No results for input(s): TSH, T4TOTAL, FREET4, T3FREE, THYROIDAB in the last 72 hours. Anemia Panel: No results for input(s): VITAMINB12, FOLATE, FERRITIN, TIBC, IRON, RETICCTPCT in the last 72 hours. Sepsis Labs: No results for input(s): PROCALCITON, LATICACIDVEN in the last 168 hours.  Recent Results (from the past 240 hour(s))  SARS CORONAVIRUS 2 (TAT 6-24 HRS)  Nasopharyngeal Nasopharyngeal Swab     Status: None   Collection Time: 08/31/20 11:42 PM   Specimen: Nasopharyngeal Swab  Result Value Ref Range Status   SARS Coronavirus 2 NEGATIVE NEGATIVE Final    Comment: (NOTE) SARS-CoV-2 target nucleic acids are NOT DETECTED.  The SARS-CoV-2 RNA is generally detectable in upper and lower respiratory specimens during the acute phase of infection. Negative results do not preclude SARS-CoV-2 infection, do not rule out co-infections with other pathogens, and should not be used as the sole basis for treatment or other patient management decisions. Negative results must be combined with clinical observations, patient history, and epidemiological information. The expected result is Negative.  Fact Sheet for Patients: SugarRoll.be  Fact Sheet for Healthcare Providers: https://www.[employer].com/  This test is not yet approved or cleared by the [place] FDA and  has been authorized for detection and/or diagnosis of SARS-CoV-2 by FDA under an Emergency Use Authorization (EUA). This EUA will remain  in effect (meaning this test can be  used) for the duration of the COVID-19 declaration under Se ction 564(b)(1) of the Act, 21 U.S.C. section 360bbb-3(b)(1), unless the authorization is terminated or revoked sooner.  Performed at [facility], [place] [address] [place], [place] [address]           Radiology Studies: MR FOOT RIGHT W WO CONTRAST  Result Date: 09/01/2020 CLINICAL DATA:  Diabetic foot wound EXAM: MRI OF THE RIGHT FOREFOOT WITHOUT AND WITH CONTRAST TECHNIQUE: Multiplanar, multisequence MR imaging of the right forefoot was performed before and after the administration of intravenous contrast. CONTRAST:  50m GADAVIST GADOBUTROL 1 MMOL/ML IV SOLN COMPARISON:  X-ray 08/31/2020 FINDINGS: Status post fourth and fifth ray resection. Broad soft tissue wound/ulceration along the lateral aspect of the forefoot. Marked bone marrow edema with confluent low T1 marrow signal throughout the proximal and middle phalanx of the right third toe (series 7, images 15-16), compatible with acute osteomyelitis. Findings suggestive of reactive osteitis involving the distal phalanx of the third toe. Minimal subcortical marrow edema involving the third metatarsal head without evidence of acute osteomyelitis. Marked bone marrow edema and confluent low T1 signal throughout the cuboid with erosive changes along its plantar margin (series 7, images 13-17), compatible with acute osteomyelitis. Bone marrow edema throughout the residual fourth metatarsal base with subtle erosive changes along its lateral margin, also suggestive of osteomyelitis. Bone marrow edema is present throughout the distal phalanx of the great toe with intermediate T1 marrow signal in the distal tuft concerning for early acute osteomyelitis. Similar findings of Charcot arthropathy within the midfoot. Chronic denervation changes with probable myositis of the intrinsic foot musculature. Diffusely edematous appearance of the soft tissues. There is ill-defined fluid along the  lateral margin of the forefoot. No well-defined or rim enhancing fluid collection. IMPRESSION: 1. Acute osteomyelitis involving the proximal and middle phalanx of the right third toe. Reactive osteitis involving the distal phalanx of the third toe. 2. Acute osteomyelitis of the cuboid with erosive changes along its plantar margin. 3. Acute osteomyelitis involving the residual fourth metatarsal base. 4. Findings suggestive of early acute osteomyelitis involving the distal phalanx of the great toe. 5. Charcot arthropathy within the midfoot. 6. Broad soft tissue wound/ulceration along the lateral aspect of the forefoot. Ill-defined fluid along the lateral forefoot without well-defined or rim enhancing fluid collection. Electronically Signed   By: NDavina PokeD.O.   On: 09/01/2020 14:12   DG Foot Complete Right  Result Date: 08/31/2020 Please see detailed  radiograph report in office note.       Scheduled Meds:  ascorbic acid  500 mg Oral Daily   aspirin EC  81 mg Oral Daily   atorvastatin  60 mg Oral Daily   enoxaparin (LOVENOX) injection  40 mg Subcutaneous Q24H   fenofibrate  54 mg Oral Daily   insulin aspart  0-20 Units Subcutaneous TID WC   insulin aspart  0-5 Units Subcutaneous QHS   multivitamin with minerals  1 tablet Oral BID   omega-3 acid ethyl esters  1 g Oral Daily   pyridoxine  100 mg Oral BID   ramipril  5 mg Oral Daily   vitamin B-12  5,000 mcg Oral Daily   Continuous Infusions:  ceFEPime (MAXIPIME) IV 2 g (09/02/20 0941)   dextrose 5 % and 0.9% NaCl 75 mL/hr at 09/02/20 0533   vancomycin 1,250 mg (09/02/20 0020)     LOS: 2 days    Time spent: 22 min    Georgette Shell, MD 09/02/2020, 10:43 AM

## 2020-09-03 ENCOUNTER — Inpatient Hospital Stay (HOSPITAL_COMMUNITY): Payer: BLUE CROSS/BLUE SHIELD

## 2020-09-03 ENCOUNTER — Inpatient Hospital Stay (HOSPITAL_COMMUNITY): Payer: BLUE CROSS/BLUE SHIELD | Admitting: Anesthesiology

## 2020-09-03 ENCOUNTER — Encounter (HOSPITAL_COMMUNITY): Payer: Self-pay | Admitting: Internal Medicine

## 2020-09-03 ENCOUNTER — Encounter (HOSPITAL_COMMUNITY)
Admission: AD | Disposition: A | Discharge status: Home Health | Payer: Self-pay | Source: Home / Self Care | Attending: Internal Medicine

## 2020-09-03 ENCOUNTER — Encounter: Payer: BLUE CROSS/BLUE SHIELD | Admitting: Internal Medicine

## 2020-09-03 DIAGNOSIS — M86671 Other chronic osteomyelitis, right ankle and foot: Secondary | ICD-10-CM | POA: Diagnosis not present

## 2020-09-03 HISTORY — PX: AMPUTATION TOE: SHX6595

## 2020-09-03 HISTORY — PX: WOUND DEBRIDEMENT: SHX247

## 2020-09-03 HISTORY — PX: BONE BIOPSY: SHX375

## 2020-09-03 LAB — CREATININE, SERUM
Creatinine, Ser: 1.24 mg/dL (ref 0.61–1.24)
GFR, Estimated: >60 mL/min (ref >60)

## 2020-09-03 LAB — GLUCOSE, CAPILLARY
Glucose-Capillary: 100 mg/dL — ABNORMAL HIGH (ref 70–99)
Glucose-Capillary: 89 mg/dL (ref 70–99)
Glucose-Capillary: 86 mg/dL (ref 70–99)
Glucose-Capillary: 77 mg/dL (ref 70–99)
Glucose-Capillary: 79 mg/dL (ref 70–99)

## 2020-09-03 SURGERY — AMPUTATION, TOE
Anesthesia: Monitor Anesthesia Care | Site: Toe | Laterality: Right

## 2020-09-03 MED ORDER — MIDAZOLAM HCL 2 MG/2ML IJ SOLN
INTRAMUSCULAR | Status: AC
  Filled 2020-09-03: qty 2

## 2020-09-03 MED ORDER — PROPOFOL 10 MG/ML IV BOLUS
INTRAVENOUS | Status: DC | PRN
  Administered 2020-09-03: 20 mg via INTRAVENOUS

## 2020-09-03 MED ORDER — ONDANSETRON HCL 4 MG/2ML IJ SOLN
INTRAMUSCULAR | Status: DC | PRN
Start: 2020-09-03 — End: 2020-09-03
  Administered 2020-09-03: 4 mg via INTRAVENOUS

## 2020-09-03 MED ORDER — LACTATED RINGERS IV SOLN: INTRAVENOUS | Status: DC

## 2020-09-03 MED ORDER — BUPIVACAINE HCL (PF) 0.5 % IJ SOLN
INTRAMUSCULAR | Status: AC
  Filled 2020-09-03: qty 30

## 2020-09-03 MED ORDER — LACTATED RINGERS IV SOLN: INTRAVENOUS | Status: DC | PRN

## 2020-09-03 MED ORDER — SODIUM CHLORIDE 0.9 % IV SOLN
INTRAVENOUS | Status: DC | PRN
  Administered 2020-09-03: 250 mL via INTRAVENOUS

## 2020-09-03 MED ORDER — 0.9 % SODIUM CHLORIDE (POUR BTL) OPTIME
TOPICAL | Status: DC | PRN
  Administered 2020-09-03: 1000 mL

## 2020-09-03 MED ORDER — BUPIVACAINE HCL (PF) 0.5 % IJ SOLN
INTRAMUSCULAR | Status: DC | PRN
  Administered 2020-09-03: 10 mL

## 2020-09-03 MED ORDER — ONDANSETRON HCL 4 MG/2ML IJ SOLN
INTRAMUSCULAR | Status: AC
  Filled 2020-09-03: qty 2

## 2020-09-03 MED ORDER — ACETAMINOPHEN 500 MG PO TABS
1000.0000 mg | ORAL_TABLET | Freq: Once | ORAL | Status: AC
  Administered 2020-09-03: 1000 mg via ORAL
  Filled 2020-09-03: qty 2

## 2020-09-03 MED ORDER — OXYCODONE HCL 5 MG PO TABS
5.0000 mg | ORAL_TABLET | Freq: Once | ORAL | Status: AC | PRN
  Administered 2020-09-03: 5 mg via ORAL

## 2020-09-03 MED ORDER — MIDAZOLAM HCL 5 MG/5ML IJ SOLN
INTRAMUSCULAR | Status: DC | PRN
  Administered 2020-09-03: 2 mg via INTRAVENOUS

## 2020-09-03 MED ORDER — PROPOFOL 10 MG/ML IV BOLUS
INTRAVENOUS | Status: AC
  Filled 2020-09-03: qty 20

## 2020-09-03 MED ORDER — PROMETHAZINE HCL 25 MG/ML IJ SOLN
6.2500 mg | INTRAMUSCULAR | Status: DC | PRN
Start: 2020-09-03 — End: 2020-09-03

## 2020-09-03 MED ORDER — FENTANYL CITRATE (PF) 100 MCG/2ML IJ SOLN: 25.0000 ug | INTRAMUSCULAR | Status: DC | PRN

## 2020-09-03 MED ORDER — VANCOMYCIN HCL 1000 MG IV SOLR
INTRAVENOUS | Status: AC
  Filled 2020-09-03: qty 20

## 2020-09-03 MED ORDER — FENTANYL CITRATE (PF) 100 MCG/2ML IJ SOLN
INTRAMUSCULAR | Status: AC
  Filled 2020-09-03: qty 2

## 2020-09-03 MED ORDER — OXYCODONE HCL 5 MG PO TABS
ORAL_TABLET | ORAL | Status: AC
  Filled 2020-09-03: qty 1

## 2020-09-03 MED ORDER — PROPOFOL 1000 MG/100ML IV EMUL
INTRAVENOUS | Status: AC
  Filled 2020-09-03: qty 100

## 2020-09-03 MED ORDER — CHLORHEXIDINE GLUCONATE 0.12 % MT SOLN
15.0000 mL | Freq: Once | OROMUCOSAL | Status: AC
  Administered 2020-09-03: 15 mL via OROMUCOSAL

## 2020-09-03 MED ORDER — PROPOFOL 500 MG/50ML IV EMUL
INTRAVENOUS | Status: DC | PRN
  Administered 2020-09-03: 75 ug/kg/min via INTRAVENOUS

## 2020-09-03 MED ORDER — FENTANYL CITRATE (PF) 100 MCG/2ML IJ SOLN
INTRAMUSCULAR | Status: DC | PRN
  Administered 2020-09-03: 100 ug via INTRAVENOUS

## 2020-09-03 MED ORDER — OXYCODONE HCL 5 MG/5ML PO SOLN: 5.0000 mg | Freq: Once | ORAL | Status: AC | PRN

## 2020-09-03 SURGICAL SUPPLY — 81 items
APL PRP STRL LF DISP 70% ISPRP (MISCELLANEOUS) ×1
BAG COUNTER SPONGE SURGICOUNT (BAG) IMPLANT
BAG SPNG CNTER NS LX DISP (BAG)
BLADE HEX COATED 2.75 (ELECTRODE) ×2 IMPLANT
BLADE OSCILLATING/SAGITTAL (BLADE) ×2
BLADE SURG 15 STRL LF DISP TIS (BLADE) ×1 IMPLANT
BLADE SURG 15 STRL SS (BLADE) ×2
BLADE SW THK.38XMED LNG THN (BLADE) IMPLANT
BNDG CMPR 9X4 STRL LF SNTH (GAUZE/BANDAGES/DRESSINGS) ×1
BNDG CONFORM 4 STRL LF (GAUZE/BANDAGES/DRESSINGS) ×2 IMPLANT
BNDG ELASTIC 3X5.8 VLCR STR LF (GAUZE/BANDAGES/DRESSINGS) ×2 IMPLANT
BNDG ELASTIC 4X5.8 VLCR STR LF (GAUZE/BANDAGES/DRESSINGS) ×1 IMPLANT
BNDG ELASTIC 6X5.8 VLCR STR LF (GAUZE/BANDAGES/DRESSINGS) ×2 IMPLANT
BNDG ESMARK 4X9 LF (GAUZE/BANDAGES/DRESSINGS) ×2 IMPLANT
BNDG GAUZE ELAST 4 BULKY (GAUZE/BANDAGES/DRESSINGS) ×2 IMPLANT
CHLORAPREP W/TINT 26 (MISCELLANEOUS) ×2 IMPLANT
CLEANER TIP ELECTROSURG 2X2 (MISCELLANEOUS) IMPLANT
CNTNR URN SCR LID CUP LEK RST (MISCELLANEOUS) IMPLANT
CONT SPEC 4OZ STRL OR WHT (MISCELLANEOUS)
COVER BACK TABLE 60X90IN (DRAPES) ×2 IMPLANT
COVER SURGICAL LIGHT HANDLE (MISCELLANEOUS) ×2 IMPLANT
CUFF TOURN SGL QUICK 18X4 (TOURNIQUET CUFF) IMPLANT
CUFF TOURN SGL QUICK 24 (TOURNIQUET CUFF)
CUFF TRNQT CYL 24X4X16.5-23 (TOURNIQUET CUFF) IMPLANT
DECANTER SPIKE VIAL GLASS SM (MISCELLANEOUS) IMPLANT
DRAPE EXTREMITY T 121X128X90 (DISPOSABLE) ×2 IMPLANT
DRAPE IMP U-DRAPE 54X76 (DRAPES) ×2 IMPLANT
DRAPE U-SHAPE 47X51 STRL (DRAPES) ×2 IMPLANT
DRSG EMULSION OIL 3X3 NADH (GAUZE/BANDAGES/DRESSINGS) ×2 IMPLANT
DRSG PAD ABDOMINAL 8X10 ST (GAUZE/BANDAGES/DRESSINGS) IMPLANT
ELECT REM PT RETURN 15FT ADLT (MISCELLANEOUS) ×2 IMPLANT
GAUZE 4X4 16PLY ~~LOC~~+RFID DBL (SPONGE) IMPLANT
GAUZE SPONGE 4X4 12PLY STRL (GAUZE/BANDAGES/DRESSINGS) ×2 IMPLANT
GAUZE XEROFORM 1X8 LF (GAUZE/BANDAGES/DRESSINGS) ×2 IMPLANT
GAUZE XEROFORM 5X9 LF (GAUZE/BANDAGES/DRESSINGS) ×1 IMPLANT
GLOVE SRG 8 PF TXTR STRL LF DI (GLOVE) ×1 IMPLANT
GLOVE SURG ENC MOIS LTX SZ7.5 (GLOVE) ×2 IMPLANT
GLOVE SURG ENC MOIS LTX SZ8 (GLOVE) ×2 IMPLANT
GLOVE SURG NEOPR MICRO LF SZ8 (GLOVE) ×2 IMPLANT
GLOVE SURG UNDER POLY LF SZ8 (GLOVE) ×2
GOWN STRL REUS W/ TWL LRG LVL3 (GOWN DISPOSABLE) ×1 IMPLANT
GOWN STRL REUS W/TWL LRG LVL3 (GOWN DISPOSABLE) ×2
GOWN STRL REUS W/TWL XL LVL3 (GOWN DISPOSABLE) ×4 IMPLANT
KIT BASIN OR (CUSTOM PROCEDURE TRAY) ×2 IMPLANT
KIT STIMULAN RAPID CURE  10CC (Orthopedic Implant) ×2 IMPLANT
KIT STIMULAN RAPID CURE 10CC (Orthopedic Implant) IMPLANT
KIT TURNOVER KIT A (KITS) ×2 IMPLANT
MANIFOLD NEPTUNE II (INSTRUMENTS) ×2 IMPLANT
NDL BIOPSY JAMSHIDI 11X6 (NEEDLE) IMPLANT
NDL HYPO 25X1 1.5 SAFETY (NEEDLE) ×1 IMPLANT
NEEDLE BIOPSY JAMSHIDI 11X6 (NEEDLE) ×2 IMPLANT
NEEDLE HYPO 25X1 1.5 SAFETY (NEEDLE) ×2 IMPLANT
NS IRRIG 1000ML POUR BTL (IV SOLUTION) IMPLANT
PACK ORTHO EXTREMITY (CUSTOM PROCEDURE TRAY) ×2 IMPLANT
PADDING CAST ABS 4INX4YD NS (CAST SUPPLIES) ×1
PADDING CAST ABS COTTON 4X4 ST (CAST SUPPLIES) ×1 IMPLANT
PADDING UNDERCAST 2 STRL (CAST SUPPLIES) ×1
PADDING UNDERCAST 2X4 STRL (CAST SUPPLIES) ×1 IMPLANT
PENCIL SMOKE EVACUATOR (MISCELLANEOUS) ×2 IMPLANT
SET IRRIG Y TYPE TUR BLADDER L (SET/KITS/TRAYS/PACK) IMPLANT
SLEEVE SCD COMPRESS KNEE MED (STOCKING) ×2 IMPLANT
SPONGE SURGIFOAM ABS GEL 100 (HEMOSTASIS) IMPLANT
STAPLER VISISTAT 35W (STAPLE) ×2 IMPLANT
STOCKINETTE 8 INCH (MISCELLANEOUS) ×4 IMPLANT
SUT ETHILON 2 0 PS N (SUTURE) ×2 IMPLANT
SUT ETHILON 3 0 PS 1 (SUTURE) ×1 IMPLANT
SUT ETHILON 4 0 PS 2 18 (SUTURE) ×2 IMPLANT
SUT MNCRL AB 3-0 PS2 18 (SUTURE) IMPLANT
SUT MNCRL AB 4-0 PS2 18 (SUTURE) IMPLANT
SUT MON AB 5-0 PS2 18 (SUTURE) IMPLANT
SUT VIC AB 3-0 FS2 27 (SUTURE) ×2 IMPLANT
SUT VIC AB 4-0 PS2 18 (SUTURE) ×2 IMPLANT
SUT VIC AB 4-0 PS2 27 (SUTURE) ×2 IMPLANT
SYR 20ML LL LF (SYRINGE) IMPLANT
SYR BULB EAR ULCER 3OZ GRN STR (SYRINGE) ×2 IMPLANT
SYR CONTROL 10ML LL (SYRINGE) ×2 IMPLANT
TOWEL OR 17X26 10 PK STRL BLUE (TOWEL DISPOSABLE) ×2 IMPLANT
TOWEL OR NON WOVEN STRL DISP B (DISPOSABLE) ×2 IMPLANT
TRAY PREP A LATEX SAFE STRL (SET/KITS/TRAYS/PACK) ×2 IMPLANT
UNDERPAD 30X36 HEAVY ABSORB (UNDERPADS AND DIAPERS) ×4 IMPLANT
YANKAUER SUCT BULB TIP NO VENT (SUCTIONS) ×2 IMPLANT

## 2020-09-03 NOTE — Transfer of Care (Signed)
Immediate Anesthesia Transfer of Care Note  Patient: Lynx Goodrich [name]  Procedure(s) Performed: AMPUTATION TOE (Right: Toe) DEBRIDEMENT WOUND, INSERTION OF ANTIBIOTIC BEADS (Right: Toe) BONE BIOPSY (Right: Toe)  Patient Location: PACU  Anesthesia Type:MAC  Level of Consciousness: awake, alert , oriented and patient cooperative  Airway & Oxygen Therapy: Patient Spontanous Breathing and Patient connected to face mask oxygen  Post-op Assessment: Report given to RN, Post -op Vital signs reviewed and stable and Patient moving all extremities X 4  Post vital signs: stable  Last Vitals:  Vitals Value Taken Time  BP    Temp    Pulse 69 09/03/20 1913  Resp 11 09/03/20 1913  SpO2 100 % 09/03/20 1913  Vitals shown include unvalidated device data.  Last Pain:  Vitals:   09/03/20 0800  TempSrc:   PainSc: 0-No pain         Complications: No notable events documented.

## 2020-09-03 NOTE — Anesthesia Preprocedure Evaluation (Addendum)
Anesthesia Evaluation  Patient identified by MRN, date of birth, ID band Patient awake    Reviewed: Allergy & Precautions, NPO status , Patient's Chart, lab work & pertinent test results  History of Anesthesia Complications Negative for: history of anesthetic complications  Airway Mallampati: II  TM Distance: >3 FB Neck ROM: Full    Dental no notable dental hx.    Pulmonary neg pulmonary ROS,    Pulmonary exam normal        Cardiovascular hypertension, Pt. on medications + Peripheral Vascular Disease  Normal cardiovascular exam     Neuro/Psych negative neurological ROS  negative psych ROS   GI/Hepatic negative GI ROS, Neg liver ROS,   Endo/Other  diabetes, Type 2, Insulin Dependent, Oral Hypoglycemic AgentsMorbid obesity  Renal/GU Renal InsufficiencyRenal disease  negative genitourinary   Musculoskeletal  (+) Arthritis ,   Abdominal   Peds  Hematology  (+) anemia , Hgb 10.7   Anesthesia Other Findings Day of surgery medications reviewed with patient.  Reproductive/Obstetrics negative OB ROS                            Anesthesia Physical Anesthesia Plan  ASA: 3  Anesthesia Plan: MAC   Post-op Pain Management:    Induction:   PONV Risk Score and Plan: Treatment may vary due to age or medical condition, Propofol infusion and Ondansetron  Airway Management Planned: Natural Airway and Simple Face Mask  Additional Equipment: None  Intra-op Plan:   Post-operative Plan:   Informed Consent: I have reviewed the patients History and Physical, chart, labs and discussed the procedure including the risks, benefits and alternatives for the proposed anesthesia with the patient or authorized representative who has indicated his/her understanding and acceptance.       Plan Discussed with: CRNA  Anesthesia Plan Comments:        Anesthesia Quick Evaluation

## 2020-09-03 NOTE — Anesthesia Procedure Notes (Signed)
Procedure Name: MAC Date/Time: 09/03/2020 6:04 PM Performed by: Lissa Morales, CRNA Pre-anesthesia Checklist: Patient identified, Emergency Drugs available, Suction available and Patient being monitored Patient Re-evaluated:Patient Re-evaluated prior to induction Oxygen Delivery Method: Simple face mask Placement Confirmation: positive ETCO2

## 2020-09-03 NOTE — Progress Notes (Signed)
Spoke with Dr. Sherryle Lis about surgery plan. Will plan to proceed with me to surgery this evening. NPO after 0900. Will call patient/wife shortly and give update.  [name], DPM  09/03/20 8:40 AM

## 2020-09-03 NOTE — Brief Op Note (Signed)
08/31/2020 - 09/03/2020  7:03 PM  PATIENT:  [name]  50 y.o. male  PRE-OPERATIVE DIAGNOSIS:  Osteomyelitis  POST-OPERATIVE DIAGNOSIS:  Osteomyelitis  PROCEDURE:  Procedure(s): AMPUTATION TOE (Right) DEBRIDEMENT WOUND, INSERTION OF ANTIBIOTIC BEADS (Right) BONE BIOPSY (Right)  SURGEON:  Surgeon(s) and Role:    * [name], DPM - Primary  PHYSICIAN ASSISTANT:   ASSISTANTS: none   ANESTHESIA:   local and MAC  EBL:  25   BLOOD ADMINISTERED:none  DRAINS: none   LOCAL MEDICATIONS USED:  MARCAINE    and Amount: 10 ml  SPECIMEN:   ID Type Source Tests Collected by Time Destination  1 : BONE DISTAL PHALANX RIGHT GREAT TOE Tissue PATH Bone resection SURGICAL PATHOLOGY [name], DPM 09/03/2020 1840   2 : CUBOID BONE Tissue PATH Bone resection SURGICAL PATHOLOGY [name], DPM 09/03/2020 1842   3 : 3RD TOE Tissue PATH Bone resection SURGICAL PATHOLOGY [name], DPM 09/03/2020 1851   A : BONE, DISTAL PHALANX,RT GREAT TOE Wound Wound AEROBIC/ANAEROBIC CULTURE W Lonell Grandchild STAIN (SURGICAL/DEEP WOUND) [name], DPM 09/03/2020 1833   B : CUBOID BONE Wound Wound AEROBIC/ANAEROBIC CULTURE W GRAM STAIN (SURGICAL/DEEP WOUND) [name], DPM 09/03/2020 1837   C : WOUND CULTURE RIGHT FOOT Wound Wound AEROBIC/ANAEROBIC CULTURE W Lonell Grandchild STAIN (SURGICAL/DEEP WOUND) [name], DPM 09/03/2020 1846      DISPOSITION OF SPECIMEN:  path and micro  COUNTS:  YES  TOURNIQUET:  * Missing tourniquet times found for documented tourniquets in log: [id number] *  DICTATION: .Viviann Spare Dictation  PLAN OF CARE:  transfer to floor  PATIENT DISPOSITION:  PACU - hemodynamically stable.   Delay start of Pharmacological VTE agent (>24hrs) due to surgical blood loss or risk of bleeding: not applicable

## 2020-09-03 NOTE — Anesthesia Postprocedure Evaluation (Signed)
Anesthesia Post Note  Patient: Jamas Jaquay [name]  Procedure(s) Performed: AMPUTATION TOE (Right: Toe) DEBRIDEMENT WOUND, INSERTION OF ANTIBIOTIC BEADS (Right: Toe) BONE BIOPSY (Right: Toe)     Patient location during evaluation: PACU Anesthesia Type: MAC Level of consciousness: awake and alert and oriented Pain management: pain level controlled Vital Signs Assessment: post-procedure vital signs reviewed and stable Respiratory status: spontaneous breathing, nonlabored ventilation and respiratory function stable Cardiovascular status: blood pressure returned to baseline Postop Assessment: no apparent nausea or vomiting Anesthetic complications: no   No notable events documented.  Last Vitals:  Vitals:   09/03/20 1945 09/03/20 2005  BP: 136/78 135/80  Pulse: 72 71  Resp: (!) 25 14  Temp: (!) 36.4 C 36.7 C  SpO2: 99% 100%    Last Pain:  Vitals:   09/03/20 2005  TempSrc: Oral  PainSc:                  Brennan Bailey

## 2020-09-03 NOTE — Interval H&P Note (Signed)
History and Physical Interval Note:  09/03/2020 5:39 PM  [name]  has presented today for surgery, with the diagnosis of Osteomyelitis.  The various methods of treatment have been discussed with the patient and family. After consideration of risks, benefits and other options for treatment, the patient has consented to  Procedure(s): AMPUTATION TOE (Right) DEBRIDEMENT WOUND (Right) BONE BIOPSY (Right) as a surgical intervention.  The patient's history has been reviewed, patient examined, no change in status, stable for surgery.  I have reviewed the patient's chart and labs.  Questions were answered to the patient's satisfaction.     [name]

## 2020-09-03 NOTE — Progress Notes (Signed)
PROGRESS NOTE    Azariah Krajewski [name]  [id number] DOB: Mar 10, 1970 DOA: 08/31/2020 PCP: Marin Olp, MD   Brief Narrative: 50 year old male with longstanding type 2 diabetes uncontrolled, hypertension, diabetic neuropathy diabetic nephropathy pituitary macroadenoma peripheral vascular disease status post amputation of the right fifth and the fourth dose 4 years ago.  He is admitted for right foot osteomyelitis by MRI.  Patient was seen by podiatry he has a  right lateral plantar foot wound which is macerated revealing right third toe Exposed bone.  He was sent to the hospital by Dr. March Rummage for MRI and IV antibiotics and possible surgery. 09/03/2020 patient is scheduled to have surgery today.  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  Acute osteomyelitis involving the residual fourth metatarsal base.  Early acute osteomyelitis involving the distal phalanx of the great toe.  Charcot's arthropathy within the midfoot.  Broad soft tissue wound and ulceration along the lateral aspect of the forefoot ill-defined fluid along the lateral forefoot without well-defined or rim-enhancing fluid collection.  Continue IV antibiotics vancomycin and cefepime.   For OR today for possible amputation of the third toe and biopsy and culture  of the hallux cuboid and fourth metatarsal bone to confirm the MRI findings of osteomyelitis and guide antibiotic therapy.  We will consult ID once we have the culture and biopsies back for recommendation and duration of antibiotics.  #2 type 2 diabetes with hyperglycemia -resolved.  Last hemoglobin A1c was 6.5 during this admission.  The highest A1c I can see is 7.23 February 2020. CBG (last 3)  Recent Labs    09/02/20 2208 09/03/20 0747 09/03/20 1211  GLUCAP 117* 100* 89     #3 CKD stage IIIa complicated with diabetic nephropathy monitor renal functions on vancomycin.  Pharmacy following Vanco levels and adjusting the dose.  Creatinine 1.24 slowly trending up. I have held the ramipril HCTZ and naproxen while he is on vancomycin to minimize nephrotoxicity.  #4 history of essential hypertension on HCTZ and ramipril at home these have been on hold.  Blood pressure 157/93 he was started on hydralazine on 09/02/2020.    #5 hyperlipidemia on statin   Estimated body mass index is 42.9 kg/m as calculated from the following:   Height as of this encounter: 6' (1.829 m).   Weight as of this encounter: 143.5 kg.  DVT prophylaxis: Lovenox  code Status: Full code  family Communication: None at bedside Disposition Plan:  Status is: Inpatient  Remains inpatient appropriate because:IV treatments appropriate due to intensity of illness or inability to take PO  Dispo: The patient is from: Home              Anticipated d/c is to: Home              Patient currently is not medically stable to d/c.   Difficult to  place patient No  Consultants:  podiatry  Procedures: None Antimicrobials: Vancomycin and cefepime Subjective: He is resting in bed he is scheduled for OR later today he had breakfast this morning.  He has no new complaints no diarrhea nausea vomiting.  No chest pain or shortness of breath.  Objective: Vitals:   09/02/20 1438 09/02/20 2211 09/03/20 0538 09/03/20 1434  BP: (!) 152/92  (!) 141/91 (!) 149/94 (!) 157/93  Pulse: 74 72 65 71  Resp:  '16 16 16  '$ Temp: 98.4 F (36.9 C) 98 F (36.7 C) 97.9 F (36.6 C) 98.7 F (37.1 C)  TempSrc: Oral Oral Oral   SpO2: 99% 100% 100% 100%  Weight:      Height:        Intake/Output Summary (Last 24 hours) at 09/03/2020 1519 Last data filed at 09/03/2020 1500 Gross per 24 hour  Intake 1040 ml  Output 350 ml  Net 690 ml    Filed Weights   09/01/20 1300  Weight: (!) 143.5 kg    Examination:  General exam: Appears calm and comfortable  Respiratory system: Clear to auscultation. Respiratory effort normal. Cardiovascular system: S1 & S2 heard, RRR. No JVD, murmurs, rubs, gallops or clicks. No pedal edema. Gastrointestinal system: Abdomen is nondistended, soft and nontender. No organomegaly or masses felt. Normal bowel sounds heard. Central nervous system: Alert and oriented. No focal neurological deficits. Extremities: Right foot covered with dressing  skin: No rashes, lesions or ulcers Psychiatry: Judgement and insight appear normal. Mood & affect appropriate.     Data Reviewed: I have personally reviewed following labs and imaging studies  CBC: Recent Labs  Lab 08/31/20 2315 09/01/20 0331  WBC 8.3 7.5  HGB 11.2* 10.7*  HCT 36.0* 34.3*  MCV 82.2 82.5  PLT 341 [id number]    Basic Metabolic Panel: Recent Labs  Lab 08/31/20 2315 09/01/20 0331 09/02/20 0323 09/03/20 0305  NA  --  140  --   --   K  --  3.8  --   --   CL  --  108  --   --   CO2  --  26  --   --   GLUCOSE  --  95  --   --   BUN  --  11  --   --   CREATININE 1.16 1.11 1.17 1.24  CALCIUM  --  8.9  --   --     GFR: Estimated Creatinine Clearance: 104.8 mL/min (by C-G formula based on SCr of 1.24 mg/dL). Liver Function Tests: Recent Labs  Lab 09/01/20 0331  AST 19  ALT 30  ALKPHOS 75  BILITOT 0.8  PROT 7.1  ALBUMIN 3.0*    No results for input(s): LIPASE, AMYLASE in the last 168 hours. No results for input(s): AMMONIA in the last 168  hours. Coagulation Profile: No results for input(s): INR, PROTIME in the last 168 hours. Cardiac Enzymes: No results for input(s): CKTOTAL, CKMB, CKMBINDEX, TROPONINI in the last 168 hours. BNP (last 3 results) No results for input(s): PROBNP in the last 8760 hours. HbA1C: Recent Labs    08/31/20 2315  HGBA1C 6.5*    CBG: Recent Labs  Lab 09/02/20 1637 09/02/20 1752 09/02/20 2208 09/03/20 0747 09/03/20 1211  GLUCAP 74 102* 117* 100* 89    Lipid Profile: No results for input(s): CHOL, HDL, LDLCALC, TRIG, CHOLHDL, LDLDIRECT in the last 72 hours. Thyroid Function Tests: No results for input(s): TSH, T4TOTAL, FREET4, T3FREE, THYROIDAB in the last 72 hours.  Anemia Panel: No results for input(s): VITAMINB12, FOLATE, FERRITIN, TIBC, IRON, RETICCTPCT in the last 72 hours. Sepsis Labs: No results for input(s): PROCALCITON, LATICACIDVEN in the last 168 hours.  Recent Results (from the past 240 hour(s))  SARS CORONAVIRUS 2 (TAT 6-24 HRS) Nasopharyngeal Nasopharyngeal Swab     Status: None   Collection Time: 08/31/20 11:42 PM   Specimen: Nasopharyngeal Swab  Result Value Ref Range Status   SARS Coronavirus 2 NEGATIVE NEGATIVE Final    Comment: (NOTE) SARS-CoV-2 target nucleic acids are NOT DETECTED.  The SARS-CoV-2 RNA is generally detectable in upper and lower respiratory specimens during the acute phase of infection. Negative results do not preclude SARS-CoV-2 infection, do not rule out co-infections with other pathogens, and should not be used as the sole basis for treatment or other patient management decisions. Negative results must be combined with clinical observations, patient history, and epidemiological information. The expected result is Negative.  Fact Sheet for Patients: SugarRoll.be  Fact Sheet for Healthcare Providers: https://www.[employer].com/  This test is not yet approved or cleared by the [place] FDA  and  has been authorized for detection and/or diagnosis of SARS-CoV-2 by FDA under an Emergency Use Authorization (EUA). This EUA will remain  in effect (meaning this test can be used) for the duration of the COVID-19 declaration under Se ction 564(b)(1) of the Act, 21 U.S.C. section 360bbb-3(b)(1), unless the authorization is terminated or revoked sooner.  Performed at [facility], [place] [address], [place], [place] [address]   Surgical pcr screen     Status: None   Collection Time: 09/02/20  6:26 PM   Specimen: Nasal Mucosa; Nasal Swab  Result Value Ref Range Status   MRSA, PCR NEGATIVE NEGATIVE Final   Staphylococcus aureus NEGATIVE NEGATIVE Final    Comment: (NOTE) The Xpert SA Assay (FDA approved for NASAL specimens in patients 65 years of age and older), is one component of a comprehensive surveillance program. It is not intended to diagnose infection nor to guide or monitor treatment. Performed at [facility], [place] [address], [name], [place] [address]           Radiology Studies: No results found.      Scheduled Meds:  ascorbic acid  500 mg Oral Daily   aspirin EC  81 mg Oral Daily   atorvastatin  60 mg Oral Daily   enoxaparin (LOVENOX) injection  40 mg Subcutaneous Q24H   fenofibrate  54 mg Oral Daily   hydrALAZINE  50 mg Oral Q8H   insulin aspart  0-20 Units Subcutaneous TID WC   insulin aspart  0-5 Units Subcutaneous QHS   multivitamin with minerals  1 tablet Oral BID   omega-3 acid ethyl esters  1 g Oral Daily   pyridoxine  100 mg Oral BID   vitamin B-12  5,000 mcg Oral Daily   Continuous Infusions:  ceFEPime (MAXIPIME) IV 2 g (09/03/20 0943)   vancomycin 1,250 mg (09/03/20 1231)     LOS: 3 days    Time spent: 54 min    Georgette Shell, MD 09/03/2020, 3:19 PM

## 2020-09-03 NOTE — Plan of Care (Signed)
  Problem: Coping: Goal: Level of anxiety will decrease Outcome: Progressing   Problem: Pain Managment: Goal: General experience of comfort will improve Outcome: Progressing   

## 2020-09-04 ENCOUNTER — Encounter (HOSPITAL_COMMUNITY): Payer: Self-pay | Admitting: Podiatry

## 2020-09-04 DIAGNOSIS — M86671 Other chronic osteomyelitis, right ankle and foot: Secondary | ICD-10-CM | POA: Diagnosis not present

## 2020-09-04 LAB — GLUCOSE, CAPILLARY
Glucose-Capillary: 80 mg/dL (ref 70–99)
Glucose-Capillary: 98 mg/dL (ref 70–99)
Glucose-Capillary: 131 mg/dL — ABNORMAL HIGH (ref 70–99)
Glucose-Capillary: 158 mg/dL — ABNORMAL HIGH (ref 70–99)

## 2020-09-04 LAB — CBC
HCT: 41.4 % (ref 39.0–52.0)
Hemoglobin: 12.9 g/dL — ABNORMAL LOW (ref 13.0–17.0)
MCH: 25.3 pg — ABNORMAL LOW (ref 26.0–34.0)
MCHC: 31.2 g/dL (ref 30.0–36.0)
MCV: 81.3 fL (ref 80.0–100.0)
Platelets: 365 10*3/uL (ref 150–400)
RBC: 5.09 MIL/uL (ref 4.22–5.81)
RDW: 13.1 % (ref 11.5–15.5)
WBC: 12.4 10*3/uL — ABNORMAL HIGH (ref 4.0–10.5)
nRBC: 0 % (ref 0.0–0.2)

## 2020-09-04 LAB — COMPREHENSIVE METABOLIC PANEL
ALT: 36 U/L (ref 0–44)
AST: 33 U/L (ref 15–41)
Albumin: 3.5 g/dL (ref 3.5–5.0)
Alkaline Phosphatase: 78 U/L (ref 38–126)
Anion gap: 9 (ref 5–15)
BUN: 15 mg/dL (ref 6–20)
CO2: 25 mmol/L (ref 22–32)
Calcium: 9.4 mg/dL (ref 8.9–10.3)
Chloride: 103 mmol/L (ref 98–111)
Creatinine, Ser: 1.16 mg/dL (ref 0.61–1.24)
GFR, Estimated: >60 mL/min (ref >60)
Glucose, Bld: 98 mg/dL (ref 70–99)
Potassium: 4.2 mmol/L (ref 3.5–5.1)
Sodium: 137 mmol/L (ref 135–145)
Total Bilirubin: 1.3 mg/dL — ABNORMAL HIGH (ref 0.3–1.2)
Total Protein: 8.5 g/dL — ABNORMAL HIGH (ref 6.5–8.1)

## 2020-09-04 LAB — VANCOMYCIN, TROUGH: Vancomycin Tr: 22 ug/mL (ref 15–20)

## 2020-09-04 LAB — CREATININE, SERUM
Creatinine, Ser: 1.13 mg/dL (ref 0.61–1.24)
GFR, Estimated: >60 mL/min (ref >60)

## 2020-09-04 MED ORDER — VANCOMYCIN HCL IN DEXTROSE 1-5 GM/200ML-% IV SOLN
1000.0000 mg | Freq: Two times a day (BID) | INTRAVENOUS | Status: DC
  Administered 2020-09-04 – 2020-09-06 (×4): 1000 mg via INTRAVENOUS
  Filled 2020-09-04 (×4): qty 200

## 2020-09-04 NOTE — Op Note (Signed)
Patient Name: [name] DOB: 11/07/1970  MRN: [id number]   Date of Surgery: 09/03/20  Surgeon: Dr. Hardie Pulley, DPM Assistants: none  Pre-operative Diagnosis:  Ulcer right great toe with concern for OM Ulcer right 3rd toe with OM Right foot ulcer lateral foot with OM Post-operative Diagnosis:  Same Procedures:  1) Right foot distal phalanx great toe bone biopsy  2) Debridement of wound right foot - to bone  3) Right 3rd toe amputation  4) Insertion of abx beads Pathology/Specimens: ID Type Source Tests Collected by Time Destination  1 : BONE DISTAL PHALANX RIGHT GREAT TOE Tissue PATH Bone resection SURGICAL PATHOLOGY [name], DPM 09/03/2020 1840   2 : CUBOID BONE Tissue PATH Bone resection SURGICAL PATHOLOGY [name], DPM 09/03/2020 1842   3 : 3RD TOE Tissue PATH Bone resection SURGICAL PATHOLOGY [name], DPM 09/03/2020 1851   A : BONE, DISTAL PHALANX,RT GREAT TOE Wound Wound AEROBIC/ANAEROBIC CULTURE W Lonell Grandchild STAIN (SURGICAL/DEEP WOUND) [name], DPM 09/03/2020 1833   B : CUBOID BONE Wound Wound AEROBIC/ANAEROBIC CULTURE W Lonell Grandchild STAIN (SURGICAL/DEEP WOUND) [name], DPM 09/03/2020 1837   C : WOUND CULTURE RIGHT FOOT Wound Wound AEROBIC/ANAEROBIC CULTURE W Lonell Grandchild STAIN (SURGICAL/DEEP WOUND) [name], DPM 09/03/2020 1846    Anesthesia: MAC/local Hemostasis: Anatomic Estimated Blood Loss: 50 mL Materials:  Implant Name Type Inv. Item Serial No. Manufacturer Lot No. LRB No. Used Action  KIT STIMULAN RAPID CURE  10CC - [id number] Orthopedic Implant KIT STIMULAN RAPID CURE  10CC  [employer] [id number] Right 1 Implanted   Medications: 1g vancomycin, placed into beads. Complications: none  Indications for Procedure:  This is a 50 y.o. male with a chronic wound to the right foot with MRI evidence of osteomyelitis of the cuboid, third toe and likely great toe. Patient opting  for limb salvage approach. He presents for operative  intervention.   Procedure in Detail: Patient was identified in pre-operative holding area. Formal consent was signed and the right lower extremity was marked. Patient was brought back to the operating room. Anesthesia was induced. The extremity was prepped and draped in the usual sterile fashion. Timeout was taken to confirm patient name, laterality, and procedure prior to incision.   Attention was then directed to the medial aspect of the right great toe.  A small incision was made at the medial aspect of the distal phalanx not at the area of the wound.  Blunt dissection was performed until the bone was palpable.  A Jamshidi needle was then used to biopsy the bone.  This was collected for microbiology.  This was repeated through the same incision for a bone plug for pathology.  The wound was then irrigated and closed with 4-0 nylon.  Attention was then directed to the lateral wound.  This wound measured 1 x 0.5 x 2 cm. This wound probes to bone.  A rongeur was used to biopsy the bone for both microbiology and pathology.  The wound was continually debrided with a rongeur until firm bone was noted.  The periphery of the wound was debrided with a 15 blade.  Wound was sharply excisionally debrided down to the level of the bone including bone.  The wound was then further irrigated with normal saline via pulse lavage.  Stimulan beads were then fashioned with vancomycin powder 1 g and after curing were placed into the wound.  The wound was then loosely approximated with 2-0 nylon retention sutures.  Attention was  finally directed to the third toe. A racquet shaped incision was made at the lateral aspect of the third metatarsal phalangeal joint. Dissection was carried down to level of bone.  Dissection was continued to the metatarsophalangeal joint and all collateral ligaments were freed at the joint.  The bone soft tissue attachments of the proximal phalanx were removed and passed for pathology.  The remaining  metatarsal head appeared healthy and viable.  The area was copiously irrigated.  The skin was reapproximated with nylon and skin staples.  The foot was then dressed with Xeroform, 4 x 4's, Kerlix, Ace bandage. Patient tolerated the procedure well.   Disposition: Following a period of post-operative monitoring, patient will be transferred back to the floor.

## 2020-09-04 NOTE — Consult Note (Signed)
[facility]    Date of Admission:  08/31/2020     Reason for Consult: diabetic foot om    Referring Provider: Rodena Piety   Lines:  peripheral  Abx: 8/12-c vanc 8/12-c cefepime        Assessment: 50 yo male uncontrolled dm2, htn/hlp, pituitary macroadenoma, pvd, s/p right 5th and 4th partial ray 4 years prior to admission, admitted by podiatry referal for worsening right foot ulcer/om. Mri confirmed multifocal OM of the cuboid, 4th metatarsal, distal phalanx hallux, phalanges of the 3rd toe  It appears patient was taking doxycycline prior to admission  Patient is s/p I&D with abx bead placement on 8/15. Cx in progress. No blood cx obtained this admission. Patient had no fever this admission. Wbc is a little elevated immediately post-op  Plan: Continue vanc/cefepime F/u operative tissue culture Anticipate 6 weeks abx. Oral augmentin potentially could be agent if microbial data support Please obtain vascular study as well Discussed with primary team    I spent 60 minute reviewing data/chart, and coordinating care and >50% direct face to face time providing counseling/discussing diagnostics/treatment plan with patient   ------------------------------------------------ Principal Problem:   Osteomyelitis ([name]) Active Problems:   Type II diabetes mellitus with neurological manifestations ([place])   Hyperlipidemia associated with type 2 diabetes mellitus ([name])   Hypertension associated with diabetes ([name])   Morbid obesity ([name])   Chronic kidney disease (CKD), stage III (moderate) ([name])   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 male admitted 8/12 for right diabetic foot infection found to have multifocal OM s/p I&D   Patient had a callous around 3rd toe. Followed podiatry who removed/shaved the callous 2 weeks prior to admission. Since then has  open draining wound without f/c/pain. Drainage puruelent  Admitted at discretion of podiatry team Afebrile on admission Mri foot multiple spot of acute OM  Underwent I&d 8/15  Had received doxy prior to admission Currently on vanc/cefepime Operative cx in progress  No other focal spot of pain No n/v/diarrhea/rash  nonsmoker    Family History  Problem Relation Age of Onset   Sarcoidosis Mother    Stroke Father        early 68s. former smoker   Diabetes Father    Hypertension Father    Hypertension Sister    Diabetes Paternal Grandmother    Congenital heart disease Paternal Grandmother        died of complications   Healthy Daughter    Colon cancer Neg Hx    Colon polyps Neg Hx    Esophageal cancer Neg Hx    Rectal cancer Neg Hx    Stomach cancer Neg Hx     Social History   Tobacco Use   Smoking status: Never   Smokeless tobacco: Never  Vaping Use   Vaping Use: Never used  Substance Use Topics   Alcohol use: No   Drug use: Never    No Known Allergies  Review of Systems: ROS All Other ROS was negative, except mentioned above   Past Medical History:  Diagnosis Date   Cellulitis and abscess of foot    05/ 2021 left   CKD (chronic kidney disease), stage III ([place]) 06-17-2019 per pt last visit 05-29-2016 (note in care everywhere)  currently followed by pcp   nephrologist-  dr [name] ([facility] in [place])     Diabetic peripheral neuropathy (HCC)    Elevated LFTs    followed by pcp   Hypertension    followed by pcp---- (06-17-2019 pt stated never had a stress test)   Iron deficiency anemia    Mixed hyperlipidemia    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   Prolactinoma, 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   (06-17-2019 pt stated has been stable with no changes and followed by pcp)   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[place])    11-06-2016  lower extremity angiography (dr [name])  mil tibial disease w/ occlusion of the distal peroneal artery and dorsalis pedis  artery but has widely patent posterior tibial and anterior tibial arterties   Type 2 diabetes mellitus treated with insulin ([place])    followed by pcp   (06-17-2019  pt stated checks blood sugar daily in am,  fasting sugar-- average 71)   Vitamin B 12 deficiency        Scheduled Meds:  ascorbic acid  500 mg Oral Daily   aspirin EC  81 mg Oral Daily   atorvastatin  60 mg Oral Daily   enoxaparin (LOVENOX) injection  40 mg Subcutaneous Q24H   fenofibrate  54 mg Oral Daily   hydrALAZINE  50 mg Oral Q8H   insulin aspart  0-20 Units Subcutaneous TID WC   insulin aspart  0-5 Units Subcutaneous QHS   multivitamin with minerals  1 tablet Oral BID   omega-3 acid ethyl esters  1 g Oral Daily   pyridoxine  100 mg Oral BID   vitamin B-12  5,000 mcg Oral Daily   Continuous Infusions:  sodium chloride 250 mL (09/03/20 2328)   ceFEPime (MAXIPIME) IV 2 g (09/04/20 0858)   vancomycin     PRN Meds:.sodium chloride, HYDROmorphone (DILAUDID) injection, ondansetron **OR** ondansetron (ZOFRAN) IV   OBJECTIVE: Blood pressure 129/83, pulse 83, temperature 99 F (37.2 C), resp. rate 18, height 6' (1.829 m), weight (!) 143.5 kg, SpO2 99 %.  Physical Exam  General/constitutional: no distress, pleasant HEENT: Normocephalic, PER, Conj Clear, EOMI, Oropharynx clear Neck supple CV: rrr no mrg Lungs: clear to auscultation, normal respiratory effort Abd: Soft, Nontender Ext: no edema Skin: No Rash Neuro: nonfocal MSK: right foot in bulky dressing that is clean/dry Psych alert/oriented  Central line presence: no   Lab Results Lab Results  Component Value Date   WBC 12.4 (H) 09/04/2020   HGB 12.9 (L) 09/04/2020   HCT 41.4 09/04/2020   MCV 81.3 09/04/2020   PLT 365 09/04/2020    Lab Results  Component Value Date   CREATININE 1.16 09/04/2020   BUN 15 09/04/2020   NA 137 09/04/2020   K 4.2 09/04/2020   CL 103 09/04/2020   CO2 25 09/04/2020    Lab Results  Component Value Date   ALT 36  09/04/2020   AST 33 09/04/2020   ALKPHOS 78 09/04/2020   BILITOT 1.3 (H) 09/04/2020      Microbiology: Recent Results (from the past 240 hour(s))  SARS CORONAVIRUS 2 (TAT 6-24 HRS) Nasopharyngeal Nasopharyngeal Swab     Status: None   Collection Time: 08/31/20 11:42 PM   Specimen: Nasopharyngeal Swab  Result Value Ref Range Status   SARS Coronavirus 2 NEGATIVE NEGATIVE Final    Comment: (NOTE) SARS-CoV-2 target nucleic acids are NOT DETECTED.  The SARS-CoV-2 RNA is generally detectable in upper and lower respiratory specimens during the acute phase of infection. Negative results do not preclude SARS-CoV-2 infection, do not  rule out co-infections with other pathogens, and should not be used as the sole basis for treatment or other patient management decisions. Negative results must be combined with clinical observations, patient history, and epidemiological information. The expected result is Negative.  Fact Sheet for Patients: SugarRoll.be  Fact Sheet for Healthcare Providers: https://www.woods-mathews.com/  This test is not yet approved or cleared by the [place] FDA and  has been authorized for detection and/or diagnosis of SARS-CoV-2 by FDA under an Emergency Use Authorization (EUA). This EUA will remain  in effect (meaning this test can be used) for the duration of the COVID-19 declaration under Se ction 564(b)(1) of the Act, 21 U.S.C. section 360bbb-3(b)(1), unless the authorization is terminated or revoked sooner.  Performed at [facility], [place] [address] [place], [place] [address]   Surgical pcr screen     Status: None   Collection Time: 09/02/20  6:26 PM   Specimen: Nasal Mucosa; Nasal Swab  Result Value Ref Range Status   MRSA, PCR NEGATIVE NEGATIVE Final   Staphylococcus aureus NEGATIVE NEGATIVE Final    Comment: (NOTE) The Xpert SA Assay (FDA approved for NASAL specimens in patients 22 years of age and  older), is one component of a comprehensive surveillance program. It is not intended to diagnose infection nor to guide or monitor treatment. Performed at [facility], [place] [address] [place], [place] [address]   Aerobic/Anaerobic Culture w Gram Stain (surgical/deep wound)     Status: None (Preliminary result)   Collection Time: 09/03/20  6:33 PM   Specimen: Wound  Result Value Ref Range Status   Specimen Description   Final    BONE DISTAL PHALANX RIGHT GREAT TOE Performed at [facility] [address], [place], [place] [address]    Special Requests   Final    NONE Performed at [facility], [place] [address] [place], [place] [address]    Gram Stain   Final    NO SQUAMOUS EPITHELIAL CELLS SEEN NO WBC SEEN NO ORGANISMS SEEN    Culture   Final    NO GROWTH < 12 HOURS Performed at [facility], [place] [address], [place], [place] [address]    Report Status PENDING  Incomplete  Aerobic/Anaerobic Culture w Gram Stain (surgical/deep wound)     Status: None (Preliminary result)   Collection Time: 09/03/20  6:37 PM   Specimen: Wound  Result Value Ref Range Status   Specimen Description   Final    BONE CUBOID Performed at [facility] [address], [place], [place] [address]    Special Requests   Final    NONE Performed at [facility], [place] [address], [place], [place] [address]    Gram Stain   Final    NO SQUAMOUS EPITHELIAL CELLS SEEN NO WBC SEEN NO ORGANISMS SEEN    Culture   Final    NO GROWTH < 12 HOURS Performed at [facility], [place] [address] [place], [place] [address]    Report Status PENDING  Incomplete  Aerobic/Anaerobic Culture w Gram Stain (surgical/deep wound)     Status: None (Preliminary result)   Collection Time: 09/03/20  6:46 PM   Specimen: Wound  Result Value Ref Range Status   Specimen Description   Final    WOUND FOOT RIGHT Performed at  [facility] [address], [place], [place] [address]    Special Requests   Final  NONE Performed at [facility], [place] [address] [place], [place] [address]    Gram Stain   Final    FEW SQUAMOUS EPITHELIAL CELLS PRESENT FEW WBC SEEN FEW GRAM POSITIVE COCCI    Culture   Final    NO GROWTH < 12 HOURS Performed at [facility], [address], [place], [place] [address]    Report Status PENDING  Incomplete     Serology:    Imaging: If present, new imagings (plain films, ct scans, and mri) have been personally visualized and interpreted; radiology reports have been reviewed. Decision making incorporated into the Impression / Recommendations.  8/13 mri right foot 1. Acute osteomyelitis involving the proximal and middle phalanx of the right third toe. Reactive osteitis involving the distal phalanx of the third toe. 2. Acute osteomyelitis of the cuboid with erosive changes along its plantar margin. 3. Acute osteomyelitis involving the residual fourth metatarsal base. 4. Findings suggestive of early acute osteomyelitis involving the distal phalanx of the great toe. 5. Charcot arthropathy within the midfoot. 6. Broad soft tissue wound/ulceration along the lateral aspect of the forefoot. Ill-defined fluid along the lateral forefoot without well-defined or rim enhancing fluid collection.  [name], [facility] [place] [phone] pager    09/04/2020, 1:50 PM

## 2020-09-04 NOTE — Progress Notes (Signed)
Date and time results received: 09/04/20 at 1208  Test Vanc Trough level  Critical Value 22  Name of Provider Notified: Rodena Piety  Orders Received? Or Actions Taken?:  Discontinued vanc. Spoke with pharmacist and made aware of the vanc trough level.

## 2020-09-04 NOTE — Plan of Care (Signed)
  Problem: Clinical Measurements: Goal: Will remain free from infection Outcome: Progressing   Problem: Pain Managment: Goal: General experience of comfort will improve Outcome: Progressing   Problem: Skin Integrity: Goal: Risk for impaired skin integrity will decrease Outcome: Progressing   

## 2020-09-04 NOTE — Progress Notes (Signed)
Subjective:  Patient ID: [name], male    DOB: 01-27-1970,  MRN: [id number]  Patient seen bedside. Pain controlled. Denies post-op issues. Objective:   Vitals:   09/04/20 0949 09/04/20 1408  BP: 129/83 135/78  Pulse: 83 90  Resp: 18 17  Temp: 99 F (37.2 C) 98.8 F (37.1 C)  SpO2: 99% 99%   General AA&O x3. Normal mood and affect.  Vascular Right foot warm and well perfused.  Neurologic Epicritic sensation grossly intact.  Dermatologic Dressing c/d/I right foot.  Orthopedic: +Motor present to the toes.   Results for orders placed or performed during the hospital encounter of 08/31/20  SARS CORONAVIRUS 2 (TAT 6-24 HRS) Nasopharyngeal Nasopharyngeal Swab     Status: None   Collection Time: 08/31/20 11:42 PM   Specimen: Nasopharyngeal Swab  Result Value Ref Range Status   SARS Coronavirus 2 NEGATIVE NEGATIVE Final    Comment: (NOTE) SARS-CoV-2 target nucleic acids are NOT DETECTED.  The SARS-CoV-2 RNA is generally detectable in upper and lower respiratory specimens during the acute phase of infection. Negative results do not preclude SARS-CoV-2 infection, do not rule out co-infections with other pathogens, and should not be used as the sole basis for treatment or other patient management decisions. Negative results must be combined with clinical observations, patient history, and epidemiological information. The expected result is Negative.  Fact Sheet for Patients: SugarRoll.be  Fact Sheet for Healthcare Providers: https://www.woods-mathews.com/  This test is not yet approved or cleared by the [place] FDA and  has been authorized for detection and/or diagnosis of SARS-CoV-2 by FDA under an Emergency Use Authorization (EUA). This EUA will remain  in effect (meaning this test can be used) for the duration of the COVID-19 declaration under Se ction 564(b)(1) of the Act, 21 U.S.C. section 360bbb-3(b)(1), unless  the authorization is terminated or revoked sooner.  Performed at [facility], [place] [address], [place], [place] [address]   Surgical pcr screen     Status: None   Collection Time: 09/02/20  6:26 PM   Specimen: Nasal Mucosa; Nasal Swab  Result Value Ref Range Status   MRSA, PCR NEGATIVE NEGATIVE Final   Staphylococcus aureus NEGATIVE NEGATIVE Final    Comment: (NOTE) The Xpert SA Assay (FDA approved for NASAL specimens in patients 33 years of age and older), is one component of a comprehensive surveillance program. It is not intended to diagnose infection nor to guide or monitor treatment. Performed at [facility], [place] [address] [place], [place] [address]   Aerobic/Anaerobic Culture w Gram Stain (surgical/deep wound)     Status: None (Preliminary result)   Collection Time: 09/03/20  6:33 PM   Specimen: Wound  Result Value Ref Range Status   Specimen Description   Final    BONE DISTAL PHALANX RIGHT GREAT TOE Performed at [facility] [address] [place], [place] [address]    Special Requests   Final    NONE Performed at [facility], [place] [address], [place], [place] [address]    Gram Stain   Final    NO SQUAMOUS EPITHELIAL CELLS SEEN NO WBC SEEN NO ORGANISMS SEEN    Culture   Final    NO GROWTH < 12 HOURS Performed at [facility], [place] [address], [place], [place] [address]    Report Status PENDING  Incomplete  Aerobic/Anaerobic Culture w Gram Stain (surgical/deep wound)     Status: None (Preliminary result)  Collection Time: 09/03/20  6:37 PM   Specimen: Wound  Result Value Ref Range Status   Specimen Description   Final    BONE CUBOID Performed at [facility] [address], [place], [place] [address]    Special Requests   Final    NONE Performed at [facility], [place] [address] [place], [place] [address]    Gram Stain   Final    NO SQUAMOUS  EPITHELIAL CELLS SEEN NO WBC SEEN NO ORGANISMS SEEN    Culture   Final    NO GROWTH < 12 HOURS Performed at [facility], [place] [address] [place], [place] [address]    Report Status PENDING  Incomplete  Aerobic/Anaerobic Culture w Gram Stain (surgical/deep wound)     Status: None (Preliminary result)   Collection Time: 09/03/20  6:46 PM   Specimen: Wound  Result Value Ref Range Status   Specimen Description   Final    WOUND FOOT RIGHT Performed at [facility] [address] [place], [place] [address]    Special Requests   Final    NONE Performed at [facility], [place] [address], [place], [place] [address]    Gram Stain   Final    FEW SQUAMOUS EPITHELIAL CELLS PRESENT FEW WBC SEEN FEW GRAM POSITIVE COCCI    Culture   Final    NO GROWTH < 12 HOURS Performed at [facility], [address], [place], [place] [address]    Report Status PENDING  Incomplete    Assessment & Plan:  Patient was evaluated and treated and all questions answered.  Right foot infection, OM -Micro/path pending. GPCs on GS of wound swab -Dressing left intact today. Will change tomorrow, POD#2 -Plan for 6w abx likely with PICC. Awaiting cultures to determine agents. Appreciate ID reccs. -Continue empiric therapy for now. -Will continue to follow -WBAT RLE in CAM boot.  [name], DPM  Accessible via secure chat for questions or concerns.

## 2020-09-04 NOTE — Progress Notes (Signed)
Pharmacy Antibiotic Note  [name] is a 50 y.o. male admitted on 08/31/2020 with local warmth, erythema, worsening ulceration with fibronecrotic base and exposed bone to rt 3rd toe . Pharmacy has been consulted to dose vancomycin and cefepime for wound infection  09/04/20 12:56 PM  POD 1 s/p toe amputation, debridement, and insertion of antibiotic beads SCr 1.13 improved some  Vancomycin trough slightly elevated at 22 (goal 15-20) Wound and bone cultures pending    Plan: Decrease vancomycin dosing to 1000 mg iv q 12 hours with plans to check AUC levels with the third dose depending on culture results and plans for antibiotics   Continue cefepime 2gm IV q8h  CMET ordered for 8/17  Follow renal function and clinical course  Height: 6' (182.9 cm) Weight: (!) 143.5 kg (316 lb 4.5 oz) (lbs) IBW/kg (Calculated) : 77.6  Temp (24hrs), Avg:98.1 F (36.7 C), Min:97.5 F (36.4 C), Max:99 F (37.2 C)  Recent Labs  Lab 08/31/20 2315 09/01/20 0331 09/02/20 0323 09/03/20 0305 09/04/20 0305 09/04/20 1116  WBC 8.3 7.5  --   --   --   --   CREATININE 1.16 1.11 1.17 1.24 1.13 1.16  VANCOTROUGH  --   --   --   --   --  22*     Estimated Creatinine Clearance: 112.1 mL/min (by C-G formula based on SCr of 1.16 mg/dL).    No Known Allergies   Thank you for allowing pharmacy to be a part of this patient's care.  Ulice Dash D  09/04/2020, 12:56 PM

## 2020-09-04 NOTE — Progress Notes (Signed)
PROGRESS NOTE    [name]  [id number] DOB: 09-25-1970 DOA: 08/31/2020 PCP: Marin Olp, MD   Brief Narrative: 50 year old male with longstanding type 2 diabetes uncontrolled, hypertension, diabetic neuropathy diabetic nephropathy pituitary macroadenoma peripheral vascular disease status post amputation of the right fifth and the fourth dose 4 years ago.  He is admitted for right foot osteomyelitis by MRI.  Patient was seen by podiatry he has a  right lateral plantar foot wound which is macerated revealing right third toe Exposed bone.  He was sent to the hospital by Dr. March Rummage for MRI and IV antibiotics and possible surgery. 09/04/2020 patient had debridement of wound and insertion of antibiotic beads and bone biopsy on the right foot. 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  Acute osteomyelitis involving the residual fourth metatarsal base.  Early acute osteomyelitis involving the distal phalanx of the great toe.  Charcot's arthropathy within the midfoot.  Broad soft tissue wound and ulceration along the lateral aspect of the forefoot ill-defined fluid along the lateral forefoot without well-defined or rim-enhancing fluid collection.  Continue IV antibiotics vancomycin and cefepime.   He had debridement  of the wound and insertion of antibiotic beads and bone biopsy of the right foot done on 09/04/2020. ID consulted 09/04/2020 for choice and duration of antibiotics. Labs from 09/04/2020 pending  #2 type 2 diabetes with hypoglycemia -resolved.  Last hemoglobin A1c was 6.5 during this admission.  The highest A1c I can see is 7.23 February 2020.  He is on [name] and NovoLog at home which has been on hold due to hypoglycemia.  He has been n.p.o. most of the time now that he started eating his blood sugars should be coming back up and consider restarting home meds.  Continue SSI for now. CBG (last 3)  Recent Labs    09/03/20 1916 09/03/20 2020 09/04/20 0751  GLUCAP 77 79 80     #3 CKD stage IIIa complicated with diabetic nephropathy monitor renal functions on vancomycin.  Pharmacy following [place] levels and adjusting the dose.  Creatinine 1.13 trending down.   I have held the ramipril HCTZ and naproxen while he is on vancomycin to minimize nephrotoxicity.  #4 history of essential hypertension on HCTZ and ramipril at home these have been on hold.  Blood pressure 129/83 improved after starting him on hydralazine on 09/02/2020.    #5 hyperlipidemia on statin   Estimated body mass index is 42.9 kg/m as calculated from the following:   Height as of this encounter: 6' (1.829 m).   Weight as of this encounter: 143.5 kg.  DVT prophylaxis: Lovenox  code Status: Full code  family Communication: None at bedside Disposition Plan:  Status is: Inpatient  Remains inpatient appropriate because:IV treatments appropriate due to intensity of illness or inability to take PO  Dispo: The patient is from:  Home              Anticipated d/c is to: Home              Patient currently is not medically stable to d/c.   Difficult to place patient No  Consultants:  podiatry  Procedures: None Antimicrobials: Vancomycin and cefepime Subjective: He is resting in bed He has some pain in the right foot  feels more like pressure No nausea vomiting diarrhea no chest pain or shortness of breath  Objective: Vitals:   09/03/20 2216 09/04/20 0153 09/04/20 0545 09/04/20 0949  BP: (!) 150/85 118/77 135/74 129/83  Pulse: 62 68 65 83  Resp: '18 18 16 18  '$ Temp: 97.7 F (36.5 C) 97.7 F (36.5 C) 98 F (36.7 C) 99 F (37.2 C)  TempSrc: Oral Oral Oral   SpO2: 97% 100% 100% 99%  Weight:      Height:        Intake/Output Summary (Last 24 hours) at 09/04/2020 1040 Last data filed at 09/04/2020 1002 Gross per 24 hour  Intake 2464.97 ml  Output 750 ml  Net 1714.97 ml    Filed Weights   09/01/20 1300  Weight: (!) 143.5 kg    Examination:  General exam: Appears calm and comfortable  Respiratory system: Clear to auscultation. Respiratory effort normal. Cardiovascular system: S1 & S2 heard, RRR. No JVD, murmurs, rubs, gallops or clicks. No pedal edema. Gastrointestinal system: Abdomen is nondistended, soft and nontender. No organomegaly or masses felt. Normal bowel sounds heard. Central nervous system: Alert and oriented. No focal neurological deficits. Extremities: Right foot covered with dressing  skin: No rashes, lesions or ulcers Psychiatry: Judgement and insight appear normal. Mood & affect appropriate.     Data Reviewed: I have personally reviewed following labs and imaging studies  CBC: Recent Labs  Lab 08/31/20 2315 09/01/20 0331  WBC 8.3 7.5  HGB 11.2* 10.7*  HCT 36.0* 34.3*  MCV 82.2 82.5  PLT 341 [id number]    Basic Metabolic Panel: Recent Labs  Lab 08/31/20 2315 09/01/20 0331 09/02/20 0323 09/03/20 0305 09/04/20 0305  NA  --  140  --   --   --   K  --  3.8  --   --   --   CL  --  108  --   --   --   CO2  --  26  --   --   --   GLUCOSE  --  95  --   --   --   BUN  --  11  --   --   --   CREATININE 1.16 1.11 1.17 1.24 1.13  CALCIUM  --  8.9  --   --   --     GFR: Estimated Creatinine Clearance: 115 mL/min (by C-G formula based on SCr of 1.13 mg/dL). Liver  Function Tests: Recent Labs  Lab 09/01/20 0331  AST 19  ALT 30  ALKPHOS 75  BILITOT 0.8  PROT 7.1  ALBUMIN 3.0*    No results for input(s): LIPASE, AMYLASE in the last 168 hours. No results for input(s): AMMONIA in the last 168 hours. Coagulation Profile: No results for input(s): INR, PROTIME in the last 168 hours. Cardiac Enzymes: No results for input(s): CKTOTAL, CKMB, CKMBINDEX, TROPONINI in the last 168 hours. BNP (last 3 results) No results for input(s): PROBNP in the last 8760 hours. HbA1C: No results for input(s): HGBA1C in the last 72 hours.  CBG: Recent Labs  Lab 09/03/20 1211 09/03/20 1633 09/03/20 1916 09/03/20 2020 09/04/20 0751  GLUCAP 89 86 77 79 80    Lipid Profile: No results for input(s): CHOL, HDL, LDLCALC, TRIG, CHOLHDL, LDLDIRECT in the last 72 hours. Thyroid Function Tests: No results for input(s): TSH, T4TOTAL, FREET4, T3FREE, THYROIDAB in the last 72 hours. Anemia Panel: No results for input(s): VITAMINB12, FOLATE, FERRITIN, TIBC, IRON, RETICCTPCT in the last 72 hours. Sepsis Labs: No results for input(s): PROCALCITON, LATICACIDVEN in the last 168 hours.  Recent Results (from the past 240 hour(s))  SARS CORONAVIRUS 2 (TAT 6-24 HRS) Nasopharyngeal Nasopharyngeal Swab     Status: None   Collection Time: 08/31/20 11:42 PM   Specimen: Nasopharyngeal Swab  Result Value Ref Range Status   SARS Coronavirus 2 NEGATIVE NEGATIVE Final    Comment: (NOTE) SARS-CoV-2 target nucleic acids are NOT DETECTED.  The SARS-CoV-2 RNA is generally detectable in upper and lower respiratory specimens during the acute phase of infection. Negative results do not preclude SARS-CoV-2 infection, do not rule out co-infections with other pathogens, and should not be used as the sole basis for treatment or other patient management decisions. Negative results must be combined with clinical observations, patient history, and epidemiological information. The  expected result is Negative.  Fact Sheet for Patients: SugarRoll.be  Fact Sheet for Healthcare Providers: https://www.woods-[place]ling.com/  This test is not yet approved or cleared by the [place] FDA and  has been authorized for detection and/or diagnosis of SARS-CoV-2 by FDA under an Emergency Use Authorization (EUA). This EUA will remain  in effect (meaning this test can be used) for the duration of the COVID-19 declaration under Se ction 564(b)(1) of the Act, 21 U.S.C. section 360bbb-3(b)(1), unless the authorization is terminated or revoked sooner.  Performed at [facility], [place] [address], [place], [place] [address]   Surgical pcr screen     Status: None   Collection Time: 09/02/20  6:26 PM   Specimen: Nasal Mucosa; Nasal Swab  Result Value Ref Range Status   MRSA, PCR NEGATIVE NEGATIVE Final   Staphylococcus aureus NEGATIVE NEGATIVE Final    Comment: (NOTE) The Xpert SA Assay (FDA approved for NASAL specimens in patients 30 years of age and older), is one component of a comprehensive surveillance program. It is not intended to diagnose infection nor to guide or monitor treatment. Performed at [facility], [place] [address], [place], [place] [address]   Aerobic/Anaerobic Culture w Gram Stain (surgical/deep wound)     Status: None (Preliminary result)   Collection Time: 09/03/20  6:33 PM   Specimen: Wound  Result Value Ref Range Status   Specimen Description   Final    BONE DISTAL PHALANX RIGHT GREAT TOE Performed at [place] [address] [place], [place] [address]    Special Requests   Final    NONE Performed at [facility], [place] [address], [place], [place] [address]    Gram Stain   Final    NO SQUAMOUS EPITHELIAL CELLS SEEN NO WBC SEEN NO ORGANISMS SEEN    Culture   Final    NO GROWTH < 12 HOURS Performed at [facility], [place] [address] [place] [address] [place], [place] [address]    Report Status PENDING  Incomplete  Aerobic/Anaerobic Culture w Gram Stain (surgical/deep wound)     Status: None (Preliminary result)   Collection Time: 09/03/20  6:37 PM   Specimen: Wound  Result Value Ref Range Status  Specimen Description   Final    BONE CUBOID Performed at [place] [address], [place], [place] [address]    Special Requests   Final    NONE Performed at [facility], [place] [address] [place], [place] [address]    Gram Stain   Final    NO SQUAMOUS EPITHELIAL CELLS SEEN NO WBC SEEN NO ORGANISMS SEEN    Culture   Final    NO GROWTH < 12 HOURS Performed at [facility], [place] [address], [place], [place] [address]    Report Status PENDING  Incomplete  Aerobic/Anaerobic Culture w Gram Stain (surgical/deep wound)     Status: None (Preliminary result)   Collection Time: 09/03/20  6:46 PM   Specimen: Wound  Result Value Ref Range Status   Specimen Description   Final    WOUND FOOT RIGHT Performed at [place] [address] [place], [place] [address]    Special Requests   Final    NONE Performed at [facility] [place] [address], [place], [place] [address]    Gram Stain   Final    FEW SQUAMOUS EPITHELIAL CELLS PRESENT FEW WBC SEEN FEW GRAM POSITIVE COCCI    Culture   Final    NO GROWTH < 12 HOURS Performed at [facility], [address], [place], [place] [address]    Report Status PENDING  Incomplete          Radiology Studies: DG Foot 2 Views Right  Result Date: 09/03/2020 CLINICAL DATA:  Postop. EXAM: RIGHT FOOT - 2 VIEW COMPARISON:  Preoperative imaging FINDINGS: Interval resection of the third toe. Interval resection of the fourth metatarsal base. Antibiotic beads project over the plantar foot adjacent to the cuboid. Skin staples in the operative bed. Plantar calcaneal spur and Achilles tendon enthesophyte. IMPRESSION:  Interval resection of the third toe and fourth metatarsal base. Antibiotic beads project over the plantar foot adjacent to the cuboid. Electronically Signed   By: Keith Rake M.D.   On: 09/03/2020 21:29        Scheduled Meds:  ascorbic acid  500 mg Oral Daily   aspirin EC  81 mg Oral Daily   atorvastatin  60 mg Oral Daily   enoxaparin (LOVENOX) injection  40 mg Subcutaneous Q24H   fenofibrate  54 mg Oral Daily   hydrALAZINE  50 mg Oral Q8H   insulin aspart  0-20 Units Subcutaneous TID WC   insulin aspart  0-5 Units Subcutaneous QHS   multivitamin with minerals  1 tablet Oral BID   omega-3 acid ethyl esters  1 g Oral Daily   pyridoxine  100 mg Oral BID   vitamin B-12  5,000 mcg Oral Daily   Continuous Infusions:  sodium chloride 250 mL (09/03/20 2328)   ceFEPime (MAXIPIME) IV 2 g (09/04/20 0858)   vancomycin 1,250 mg (09/03/20 2329)     LOS: 4 days    Time spent: 51 min    Georgette Shell, MD 09/04/2020, 10:40 AM

## 2020-09-05 ENCOUNTER — Inpatient Hospital Stay (HOSPITAL_COMMUNITY): Payer: BLUE CROSS/BLUE SHIELD

## 2020-09-05 DIAGNOSIS — E11628 Type 2 diabetes mellitus with other skin complications: Secondary | ICD-10-CM

## 2020-09-05 DIAGNOSIS — L089 Local infection of the skin and subcutaneous tissue, unspecified: Secondary | ICD-10-CM

## 2020-09-05 DIAGNOSIS — E11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ical complication: Secondary | ICD-10-CM | POA: Diagnosis not present

## 2020-09-05 DIAGNOSIS — M86671 Other chronic osteomyelitis, right ankle and foot: Secondary | ICD-10-CM | POA: Diagnosis not present

## 2020-09-05 LAB — COMPREHENSIVE METABOLIC PANEL
ALT: 30 U/L (ref 0–44)
AST: 22 U/L (ref 15–41)
Albumin: 3.1 g/dL — ABNORMAL LOW (ref 3.5–5.0)
Alkaline Phosphatase: 67 U/L (ref 38–126)
Anion gap: 7 (ref 5–15)
BUN: 16 mg/dL (ref 6–20)
CO2: 26 mmol/L (ref 22–32)
Calcium: 9.1 mg/dL (ref 8.9–10.3)
Chloride: 102 mmol/L (ref 98–111)
Creatinine, Ser: 1.15 mg/dL (ref 0.61–1.24)
GFR, Estimated: >60 mL/min (ref >60)
Glucose, Bld: 147 mg/dL — ABNORMAL HIGH (ref 70–99)
Potassium: 4.2 mmol/L (ref 3.5–5.1)
Sodium: 135 mmol/L (ref 135–145)
Total Bilirubin: 1.1 mg/dL (ref 0.3–1.2)
Total Protein: 7.2 g/dL (ref 6.5–8.1)

## 2020-09-05 LAB — GLUCOSE, CAPILLARY
Glucose-Capillary: 139 mg/dL — ABNORMAL HIGH (ref 70–99)
Glucose-Capillary: 105 mg/dL — ABNORMAL HIGH (ref 70–99)
Glucose-Capillary: 85 mg/dL (ref 70–99)
Glucose-Capillary: 124 mg/dL — ABNORMAL HIGH (ref 70–99)

## 2020-09-05 LAB — CBC
HCT: 38.1 % — ABNORMAL LOW (ref 39.0–52.0)
Hemoglobin: 12 g/dL — ABNORMAL LOW (ref 13.0–17.0)
MCH: 25.5 pg — ABNORMAL LOW (ref 26.0–34.0)
MCHC: 31.5 g/dL (ref 30.0–36.0)
MCV: 81.1 fL (ref 80.0–100.0)
Platelets: 281 10*3/uL (ref 150–400)
RBC: 4.7 MIL/uL (ref 4.22–5.81)
RDW: 13.1 % (ref 11.5–15.5)
WBC: 9.6 10*3/uL (ref 4.0–10.5)
nRBC: 0 % (ref 0.0–0.2)

## 2020-09-05 LAB — SURGICAL PATHOLOGY

## 2020-09-05 LAB — VANCOMYCIN, PEAK: Vancomycin Pk: 35 ug/mL (ref 30–40)

## 2020-09-05 NOTE — Progress Notes (Signed)
Subjective:  Patient ID: [name], male    DOB: [birth year],  MRN: [id number]  Patient seen bedside. Pain controlled. Has not moved much since surgery. Denies other complaints. Denies N/V/F/CH. Objective:   Vitals:   09/05/20 0542 09/05/20 1349  BP: 121/71 125/79  Pulse: 76 70  Resp:  17  Temp: 98.2 F (36.8 C) 98.5 F (36.9 C)  SpO2: 99% 100%   General AA&O x3. Normal mood and affect.  Vascular Right foot warm and well perfused.  Neurologic Epicritic sensation grossly intact.  Dermatologic Dressing removed. Surgical sites healing well. Mild maceration plantar wound. Other wounds healing well without warmth, erythema, SOI.  Orthopedic: +Motor present to the toes.   Results for orders placed or performed during the hospital encounter of 08/31/20  SARS CORONAVIRUS 2 (TAT 6-24 HRS) Nasopharyngeal Nasopharyngeal Swab     Status: None   Collection Time: 08/31/20 11:42 PM   Specimen: Nasopharyngeal Swab  Result Value Ref Range Status   SARS Coronavirus 2 NEGATIVE NEGATIVE Final    Comment: (NOTE) SARS-CoV-2 target nucleic acids are NOT DETECTED.  The SARS-CoV-2 RNA is generally detectable in upper and lower respiratory specimens during the acute phase of infection. Negative results do not preclude SARS-CoV-2 infection, do not rule out co-infections with other pathogens, and should not be used as the sole basis for treatment or other patient management decisions. Negative results must be combined with clinical observations, patient history, and epidemiological information. The expected result is Negative.  Fact Sheet for Patients: SugarRoll.be  Fact Sheet for Healthcare Providers: https://www.woods-mathews.com/  This test is not yet approved or cleared by the [place] FDA and  has been authorized for detection and/or diagnosis of SARS-CoV-2 by FDA under an Emergency Use Authorization (EUA). This EUA will remain  in  effect (meaning this test can be used) for the duration of the COVID-19 declaration under Se ction 564(b)(1) of the Act, 21 U.S.C. section 360bbb-3(b)(1), unless the authorization is terminated or revoked sooner.  Performed at [facility], [place] [address] [place], [place] [address]   Surgical pcr screen     Status: None   Collection Time: 09/02/20  6:26 PM   Specimen: Nasal Mucosa; Nasal Swab  Result Value Ref Range Status   MRSA, PCR NEGATIVE NEGATIVE Final   Staphylococcus aureus NEGATIVE NEGATIVE Final    Comment: (NOTE) The Xpert SA Assay (FDA approved for NASAL specimens in patients 72 years of age and older), is one component of a comprehensive surveillance program. It is not intended to diagnose infection nor to guide or monitor treatment. Performed at [facility], [place] [address] [place], [place] [address]   Aerobic/Anaerobic Culture w Gram Stain (surgical/deep wound)     Status: None (Preliminary result)   Collection Time: 09/03/20  6:33 PM   Specimen: Wound  Result Value Ref Range Status   Specimen Description   Final    BONE DISTAL PHALANX RIGHT GREAT TOE Performed at [place] [address], [place], [place] [address]    Special Requests   Final    NONE Performed at [facility], [place] [name] [place], [place] [address]    Gram Stain   Final    NO SQUAMOUS EPITHELIAL CELLS SEEN NO WBC SEEN NO ORGANISMS SEEN    Culture   Final    RARE STAPHYLOCOCCUS AUREUS CRITICAL RESULT CALLED TO, READ BACK BY AND VERIFIED WITH: DR Mykaila Blunck VIA EPIC [id number] FCP Performed at  [facility], [place] [address], [place], [place] [address]    Report Status PENDING  Incomplete  Aerobic/Anaerobic Culture w Gram Stain (surgical/deep wound)     Status: None (Preliminary result)   Collection Time: 09/03/20  6:37 PM   Specimen: Wound  Result Value Ref Range Status   Specimen Description   Final    BONE  CUBOID Performed at [place] [address] [place], [place] [address]    Special Requests   Final    NONE Performed at [facility], [place] [address], [place], [place] [address]    Gram Stain   Final    NO SQUAMOUS EPITHELIAL CELLS SEEN NO WBC SEEN NO ORGANISMS SEEN    Culture   Final    FEW PSEUDOMONAS AERUGINOSA CRITICAL RESULT CALLED TO, READ BACK BY AND VERIFIED WITH: DR Dahlia Nifong VIA EPIC [id number] FCP Performed at [facility], [place] [address], [place], [place] [address]    Report Status PENDING  Incomplete  Aerobic/Anaerobic Culture w Gram Stain (surgical/deep wound)     Status: None (Preliminary result)   Collection Time: 09/03/20  6:46 PM   Specimen: Wound  Result Value Ref Range Status   Specimen Description   Final    WOUND FOOT RIGHT Performed at [place] [address], [place], [place] [address]    Special Requests   Final    NONE Performed at [facility], [place] [address], [place], [place] [address]    Gram Stain   Final    FEW SQUAMOUS EPITHELIAL CELLS PRESENT FEW WBC SEEN FEW GRAM POSITIVE COCCI    Culture   Final    ABUNDANT PSEUDOMONAS AERUGINOSA CULTURE REINCUBATED FOR BETTER GROWTH Performed at [facility], [place] [address] [place], [place] [address]    Report Status PENDING  Incomplete    Assessment & Plan:  Patient was evaluated and treated and all questions answered.  Right foot infection, OM -Micro/path pending. Currently growing Staph, pseuodomonas. -Dressing changed. Healing well. Will place order for wound care. -Plan for 6w abx likely with PICC. Awaiting cultures to determine agents. Appreciate ID reccs. -No further surgery planned at this time. He will be ok for d/c from podiatry standpoint once abx plan finalized. -Order PT -Will continue to follow -WBAT RLE in CAM boot.  [name], DPM  Accessible via secure chat for questions or  concerns.

## 2020-09-05 NOTE — Plan of Care (Signed)

## 2020-09-05 NOTE — Progress Notes (Signed)
PROGRESS NOTE  [name] [name] [id number] DOB: 08/09/1970 DOA: 08/31/2020 PCP: [name] Olp, MD  Brief History   50 year old man PMH diabetes mellitus with diabetic neuropathy presented with right foot osteomyelitis, status post surgery 8/16.  A & P  Right foot osteomyelitis, cellulitis --Continue antibiotics per infectious disease --Continue wound management as per podiatry  Diabetes mellitus type 2 with peripheral neuropathy, last hemoglobin A1c 6.5 --long-acting on hold secondary to hypoglycemia --Continue sliding scale insulin  Essential hypertension --Hydrochlorothiazide and ramipril on hold.  Disposition Plan:  Discussion:   Status is: Inpatient  Remains inpatient appropriate because:IV treatments appropriate due to intensity of illness or inability to take PO and Inpatient level of care appropriate due to severity of illness  Dispo: The patient is from: Home              Anticipated d/c is to: Home              Patient currently is not medically stable to d/c.   Difficult to place patient No  DVT prophylaxis: enoxaparin (LOVENOX) injection 40 mg Start: 09/01/20 1000   Code Status: Full Code Level of care: Med-Surg Family Communication: none  [name] Hodgkins, MD  [facility] Direct contact: see [email or web address] (further directions at bottom of note if needed) 7PM-7AM contact night coverage as at bottom of note 09/05/2020, 7:57 PM  LOS: 5 days     Consults:  Podiatry ID   Procedures:    Significant Diagnostic Tests:     Micro Data:     Antimicrobials:    Interval History/Subjective  CC: f/u foot infection  Feels fine, no pain  Objective   Vitals:  Vitals:   09/05/20 0542 09/05/20 1349  BP: 121/71 125/79  Pulse: 76 70  Resp:  17  Temp: 98.2 F (36.8 C) 98.5 F (36.9 C)  SpO2: 99% 100%    Exam: Physical Exam Constitutional:      Appearance: Normal appearance.  Cardiovascular:     Rate and Rhythm: Normal rate and  regular rhythm.     Heart sounds: No murmur heard. Pulmonary:     Effort: Pulmonary effort is normal. No respiratory distress.     Breath sounds: Normal breath sounds.  Musculoskeletal:     Comments: Right foot dressed  Neurological:     Mental Status: He is alert.  Psychiatric:        Mood and Affect: Mood normal.        Behavior: Behavior normal.    I have personally reviewed the labs and other data, making special note of:   Today's Data  CBG stable CMP stable  Scheduled Meds:  ascorbic acid  500 mg Oral Daily   aspirin EC  81 mg Oral Daily   atorvastatin  60 mg Oral Daily   enoxaparin (LOVENOX) injection  40 mg Subcutaneous Q24H   fenofibrate  54 mg Oral Daily   hydrALAZINE  50 mg Oral Q8H   insulin aspart  0-20 Units Subcutaneous TID WC   insulin aspart  0-5 Units Subcutaneous QHS   multivitamin with minerals  1 tablet Oral BID   omega-3 acid ethyl esters  1 g Oral Daily   pyridoxine  100 mg Oral BID   vitamin B-12  5,000 mcg Oral Daily   Continuous Infusions:  sodium chloride Stopped (09/04/20 1730)   ceFEPime (MAXIPIME) IV 2 g (09/05/20 1802)   vancomycin 1,000 mg (09/05/20 1851)    Principal Problem:   Osteomyelitis ([name]) Active Problems:  Type II diabetes mellitus with neurological manifestations (HCC)   Hyperlipidemia associated with type 2 diabetes mellitus ([name])   Hypertension associated with diabetes ([name])   Morbid obesity ([name])   Diabetic foot infection ([name])   Chronic kidney disease (CKD), stage III (moderate) ([name])   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  Check the care team in [place] and look for a) attending/consulting TR[facility] provider listed and b) the [facility] team listed Log into [email or web address] and use [place]'s universal password to access. If you  do not have the password, please contact the hospital operator. Locate the [facility] provider you are looking for under [facility] and page to a number that you can be directly reached. If you still have difficulty reaching the provider, please page the [facility] (Director on Call) for the Hospitalists listed on amion for assistance.

## 2020-09-05 NOTE — Hospital Course (Signed)
50 year old man PMH diabetes mellitus with diabetic neuropathy presented with right foot osteomyelitis, status post surgery 8/16.

## 2020-09-05 NOTE — Plan of Care (Signed)
  Problem: Pain Managment: Goal: General experience of comfort will improve Outcome: Progressing   Problem: Safety: Goal: Ability to remain free from injury will improve Outcome: Progressing   Problem: Skin Integrity: Goal: Risk for impaired skin integrity will decrease Outcome: Progressing   

## 2020-09-05 NOTE — Progress Notes (Signed)
ABI has been completed.  Results can be found under chart review under CV PROC. 09/05/2020 11:08 AM Jais Demir RVT, RDMS

## 2020-09-06 DIAGNOSIS — E11628 Type 2 diabetes mellitus with other skin complications: Secondary | ICD-10-CM | POA: Diagnosis not present

## 2020-09-06 DIAGNOSIS — M86671 Other chronic osteomyelitis, right ankle and foot: Secondary | ICD-10-CM | POA: Diagnosis not present

## 2020-09-06 DIAGNOSIS — E1159 Type 2 diabetes mellitus with other circulatory complications: Secondary | ICD-10-CM | POA: Diagnosis not present

## 2020-09-06 DIAGNOSIS — I152 Hypertension secondary to endocrine disorders: Secondary | ICD-10-CM

## 2020-09-06 DIAGNOSIS — E11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ical complication: Secondary | ICD-10-CM | POA: Diagnosis not present

## 2020-09-06 LAB — GLUCOSE, CAPILLARY
Glucose-Capillary: 131 mg/dL — ABNORMAL HIGH (ref 70–99)
Glucose-Capillary: 100 mg/dL — ABNORMAL HIGH (ref 70–99)
Glucose-Capillary: 104 mg/dL — ABNORMAL HIGH (ref 70–99)
Glucose-Capillary: 176 mg/dL — ABNORMAL HIGH (ref 70–99)

## 2020-09-06 LAB — VANCOMYCIN, RANDOM: Vancomycin Rm: 18

## 2020-09-06 LAB — CREATININE, SERUM
Creatinine, Ser: 1.19 mg/dL (ref 0.61–1.24)
GFR, Estimated: >60 mL/min (ref >60)

## 2020-09-06 MED ORDER — JUVEN PO PACK
1.0000 | PACK | Freq: Two times a day (BID) | ORAL | Status: DC
  Administered 2020-09-06 – 2020-09-07 (×3): 1 via ORAL
  Filled 2020-09-06 (×5): qty 1

## 2020-09-06 MED ORDER — PROSOURCE PLUS PO LIQD
30.0000 mL | Freq: Three times a day (TID) | ORAL | Status: DC
  Administered 2020-09-06 – 2020-09-08 (×5): 30 mL via ORAL
  Filled 2020-09-06 (×5): qty 30

## 2020-09-06 MED ORDER — VANCOMYCIN HCL 1500 MG/300ML IV SOLN
1500.0000 mg | INTRAVENOUS | Status: DC
  Administered 2020-09-07: 1500 mg via INTRAVENOUS
  Filled 2020-09-06: qty 300

## 2020-09-06 MED ORDER — VANCOMYCIN HCL 750 MG/150ML IV SOLN
750.0000 mg | Freq: Once | INTRAVENOUS | Status: AC
  Administered 2020-09-06: 750 mg via INTRAVENOUS
  Filled 2020-09-06: qty 150

## 2020-09-06 NOTE — Progress Notes (Signed)
Initial Nutrition Assessment RD working remotely.  DOCUMENTATION CODES:   Morbid obesity  INTERVENTION:  - will order 1 packet Juven BID, each packet provides 95 calories, 2.5 grams of protein (collagen), and 9.8 grams of carbohydrate (3 grams sugar); also contains 7 grams of L-arginine and L-glutamine, 300 mg vitamin C, 15 mg vitamin E, 1.2 mcg vitamin B-12, 9.5 mg zinc, 200 mg calcium, and 1.5 g  Calcium Beta-hydroxy-Beta-methylbutyrate to support wound healing. - will order 0 ml Prosource Plus BID, each supplement provides 100 kcal and 15 grams protein.   - please place referral to [facility] ([facility]) for outpatient diet counseling if desire is for weight loss education.    NUTRITION DIAGNOSIS:   Increased nutrient needs related to acute illness, wound healing as evidenced by estimated needs.  GOAL:   Patient will meet greater than or equal to 90% of their needs  MONITOR:   PO intake, Supplement acceptance, Labs, Weight trends, Skin  REASON FOR ASSESSMENT:   Consult Assessment of nutrition requirement/status  ASSESSMENT:   50 year old male with medical history of vitamin B12 deficiency, iron deficiency anemia, stage 3 CKD, mixed HLD, HTN, type 2 DM, and diabetic peripheral neuropathy. He was diagnosed with R foot osteomyelitis and underwent surgery for the same on 8/15.  He has had several periods of NPO status since admission on 8/12. When ordered a diet, he has been eating 100% at all meals.   He has not been seen by a [place] RD since 07/2016.   Weight on 8/13 was 316 lb and weight has been fairly stable for the past 10 months. Non-pitting edema to RLE documented in the edema section of flow sheet.   MD note from today outlines that RD consult placed d/t patient being morbidly obese.   He is POD #2 R foot great toe bone biopsy, debridement to the bone of R foot wound, R 3rd toe amputation, and insertion of abx beads.  Per notes: - R  foot osteomyelitis and cellulitis - hx of type 2 DM with most recent HgbA1c of 6.5%  - from home and plan to return home at the time of d/c    Labs reviewed; CBGs: 131, 100, 104 mg/dl. Medications reviewed; 500 mg ascorbic acid/day, sliding scale novolog, 1 tablet multivitamin with minerals/day, 100 mg oral vitamin B6 BID, 5000 mcg oral cyanocobalamin/day.     NUTRITION - FOCUSED PHYSICAL EXAM:  Unable to complete at this time.   Diet Order:   Diet Order             Diet Carb Modified Fluid consistency: Thin; Room service appropriate? Yes  Diet effective now                   EDUCATION NEEDS:   No education needs have been identified at this time  Skin:  Skin Assessment: Skin Integrity Issues: Skin Integrity Issues:: Incisions Incisions: R foot (8/15)  Last BM:  8/15  Height:   Ht Readings from Last 1 Encounters:  08/31/20 6' (1.829 m)    Weight:   Wt Readings from Last 1 Encounters:  09/01/20 (!) 143.5 kg     Estimated Nutritional Needs:  Kcal:  2400-2650 kcal Protein:  120-135 grams Fluid:  >/= 2.7 L/day      Jarome Matin, MS, RD, LDN, CNSC Inpatient Clinical Dietitian RD pager # available in [place]  After hours/weekend pager # available in [facility]

## 2020-09-06 NOTE — Progress Notes (Addendum)
PROGRESS NOTE  [name] [name] [id number] DOB: 26-Mar-1970 DOA: 08/31/2020 PCP: Marin Olp, MD  Brief History   50 year old man PMH diabetes mellitus with diabetic neuropathy presented with right foot osteomyelitis, status post surgery 8/16.  A & P  Right foot osteomyelitis, cellulitis --Continue antibiotics per infectious disease, await cultures growing Staph and Pseudomonas --Continue wound management per podiatry  Diabetes mellitus type 2 with peripheral neuropathy, last hemoglobin A1c 6.5 --long-acting on hold secondary to hypoglycemia --remains stable, will continue sliding scale insulin  Essential hypertension --will resume hydrochlorothiazide and ramipril   Morbid obesity --consult dietician  Disposition Plan:  Discussion: home when abx determined  Status is: Inpatient  Remains inpatient appropriate because:IV treatments appropriate due to intensity of illness or inability to take PO and Inpatient level of care appropriate due to severity of illness  Dispo: The patient is from: Home              Anticipated d/c is to: Home              Patient currently is not medically stable to d/c.   Difficult to place patient No  DVT prophylaxis: enoxaparin (LOVENOX) injection 40 mg Start: 09/01/20 1000   Code Status: Full Code Level of care: Med-Surg Family Communication: none  [name], MD  [facility] Direct contact: see [email or web address] (further directions at bottom of note if needed) 7PM-7AM contact night coverage as at bottom of note 09/06/2020, 4:06 PM  LOS: 6 days     Consults:  Podiatry ID   Procedures:    Significant Diagnostic Tests:     Micro Data:     Antimicrobials:    Interval History/Subjective  CC: f/u foot infection  No complaints  Objective   Vitals:  Vitals:   09/06/20 0428 09/06/20 1325  BP: 131/83 (!) 146/94  Pulse: 81 100  Resp: 15 19  Temp: 98.2 F (36.8 C) 98.6 F (37 C)  SpO2: 99% 100%     Exam: Physical Exam Constitutional:      General: He is not in acute distress.    Appearance: Normal appearance.  Cardiovascular:     Rate and Rhythm: Normal rate and regular rhythm.     Heart sounds: No murmur heard. Pulmonary:     Effort: Pulmonary effort is normal. No respiratory distress.     Breath sounds: Normal breath sounds.  Neurological:     Mental Status: He is alert.  Psychiatric:        Mood and Affect: Mood normal.        Behavior: Behavior normal.    I have personally reviewed the labs and other data, making special note of:   Today's Data  CBG is stable  Scheduled Meds:  ascorbic acid  500 mg Oral Daily   aspirin EC  81 mg Oral Daily   atorvastatin  60 mg Oral Daily   enoxaparin (LOVENOX) injection  40 mg Subcutaneous Q24H   fenofibrate  54 mg Oral Daily   hydrALAZINE  50 mg Oral Q8H   insulin aspart  0-20 Units Subcutaneous TID WC   insulin aspart  0-5 Units Subcutaneous QHS   multivitamin with minerals  1 tablet Oral BID   omega-3 acid ethyl esters  1 g Oral Daily   pyridoxine  100 mg Oral BID   vitamin B-12  5,000 mcg Oral Daily   Continuous Infusions:  sodium chloride 10 mL/hr at 09/06/20 0319   ceFEPime (MAXIPIME) IV 2 g (09/06/20 [id number])   [  START ON 09/07/2020] vancomycin     vancomycin      Principal Problem:   Osteomyelitis (HCC) Active Problems:   Type II diabetes mellitus with neurological manifestations (HCC)   Hyperlipidemia associated with type 2 diabetes mellitus ([place])   Hypertension associated with diabetes ([place])   Morbid obesity ([place])   Diabetic foot infection ([place])   Chronic kidney disease (CKD), stage III (moderate) (HCC)   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  Check the care team in [facility] and look for a)  attending/consulting [facility] provider listed and b) the [facility] team listed Log into [email or web address] and use [place]'s universal password to access. If you do not have the password, please contact the hospital operator. Locate the [facility] provider you are looking for under [facility] and page to a number that you can be directly reached. If you still have difficulty reaching the provider, please page the [facility] (Director on Call) for the Hospitalists listed on amion for assistance.

## 2020-09-06 NOTE — Progress Notes (Signed)
Orthopedic Tech Progress Note Patient Details:  [name] [name] [birth year] [id number]  Ortho Devices Type of Ortho Device: CAM walker Ortho Device/Splint Interventions: Application   Post Interventions Patient Tolerated: Well Instructions Provided: Care of device  [name] 09/06/2020, 12:08 PM

## 2020-09-06 NOTE — Plan of Care (Signed)
  Problem: Activity: Goal: Risk for activity intolerance will decrease Outcome: Progressing   Problem: Safety: Goal: Ability to remain free from injury will improve Outcome: Progressing   Problem: Skin Integrity: Goal: Risk for impaired skin integrity will decrease Outcome: Progressing   

## 2020-09-06 NOTE — Progress Notes (Signed)
Id briefnote   Final cultures pending, but growing staph aureus and pseudomonas   -f/u final culture. Hopefully will be available 1-2 days -continue vanc/cefepime

## 2020-09-06 NOTE — Plan of Care (Signed)

## 2020-09-06 NOTE — Progress Notes (Signed)
Pharmacy Antibiotic Note  Auron Forrister [name] is a 50 y.o. male admitted on 08/31/2020 with local warmth, erythema, worsening ulceration with fibronecrotic base and exposed bone to rt 3rd toe . Pharmacy consulted to dose vancomycin and cefepime for wound infection  09/06/20 8:32 AM  POD 3 s/p toe amputation, debridement, and insertion of antibiotic beads SCr 1.19 stable  Vancomycin levels around 8/17 evening dose:       - Calculated AUC 614.8, above goal of 400-550 Wound and bone cultures pending    Plan: Change Vancomycin to 1500 mg IV every 24 hours starting tomorrow (predicted AUC 460.4, administer vancomycin 750 mg IV x 1 this evening to transition dose) Continue cefepime 2gm IV q8h Follow renal function and clinical course  Height: 6' (182.9 cm) Weight: (!) 143.5 kg (316 lb 4.5 oz) (lbs) IBW/kg (Calculated) : 77.6  Temp (24hrs), Avg:98.3 F (36.8 C), Min:98.2 F (36.8 C), Max:98.5 F (36.9 C)  Recent Labs  Lab 08/31/20 2315 09/01/20 0331 09/02/20 0323 09/03/20 0305 09/04/20 0305 09/04/20 1116 09/04/20 1244 09/05/20 0331 09/05/20 0745 09/05/20 2032 09/06/20 0554  WBC 8.3 7.5  --   --   --   --  12.4* 9.6  --   --   --   CREATININE 1.16 1.11   < > 1.24 1.13 1.16  --   --  1.15  --  1.19  VANCOTROUGH  --   --   --   --   --  22*  --   --   --   --   --   VANCOPEAK  --   --   --   --   --   --   --   --   --  35  --   VANCORANDOM  --   --   --   --   --   --   --   --   --   --  18   < > = values in this interval not displayed.     Estimated Creatinine Clearance: 109.2 mL/min (by C-G formula based on SCr of 1.19 mg/dL).    No Known Allergies   Thank you for allowing pharmacy to be a part of this patient's care.  Georg Ang P. Legrand Como, PharmD, [place] Please utilize Amion for appropriate phone number to reach the unit pharmacist ([place]) 09/06/2020 8:34 AM

## 2020-09-06 NOTE — Evaluation (Signed)
Physical Therapy One Time Evaluation Patient Details Name: [name] MRN: [id number] DOB: 08-21-1970 Today's Date: 09/06/2020   History of Present Illness  50 year old man status post Right 3rd toe amputation, Debridement of wound right foot - to bone, and Right foot distal phalanx great toe bone biopsy surgery on 8/16 due to right foot osteomyelitis.  PMH: diabetes mellitus with diabetic neuropathy, and multiple bil foot surgeries including toe amputations  Clinical Impression  Patient evaluated by Physical Therapy with no further acute PT needs identified. All education has been completed and the patient has no further questions.  Pt did not have CAM boot in room this morning so notified RN and awaited CAM boot prior to mobilizing.  Pt reports having multiple other CAM boots at home as well.   Pt educated on heel weight bearing.  Pt familiar with most educational pieces as he reports this is his 10th foot surgery.  Emphasized heel weight bearing and using assistive device if needed to allow for healing (pt has cane, walker and knee scooter at home).  Pt pleasant and very cooperative.  No further PT needs identified at this time, and pt grateful to be able to ambulate in hallway today.  PT is signing off. Thank you for this referral.     Follow Up Recommendations No PT follow up    Equipment Recommendations  None recommended by PT    Recommendations for Other Services       Precautions / Restrictions Precautions Precautions: None Restrictions Weight Bearing Restrictions: No Other Position/Activity Restrictions: heel weight bearing with CAM boot per PT orders      Mobility  Bed Mobility Overal bed mobility: Modified Independent             General bed mobility comments: sitting EOB on arrival    Transfers Overall transfer level: Needs assistance Equipment used: None Transfers: Sit to/from Stand Sit to Stand: Supervision         General transfer comment:  only cue for heel weight bearing  Ambulation/Gait Ambulation/Gait assistance: Supervision Gait Distance (Feet): 400 Feet Assistive device: None Gait Pattern/deviations: Step-to pattern;Step-through pattern;Antalgic     General Gait Details: only cues for heel weight bearing, did not require assistive device however discussed using one at home if having difficulty maintaining only heel weight bearing or noticing exudate/bloody drainage at surgery site  Stairs Stairs: Yes Stairs assistance: Supervision Stair Management: Step to pattern;Forwards Number of Stairs: 3 General stair comments: pt hands grazed rails however pt reports he knows he can perform with spouse's assist at home, cues for sequence  Wheelchair Mobility    Modified Rankin (Stroke Patients Only)       Balance                                             Pertinent Vitals/Pain Pain Assessment: No/denies pain    Home Living Family/patient expects to be discharged to:: Private residence Living Arrangements: Spouse/significant other Available Help at Discharge: Family Type of Home: House Home Access: Stairs to enter Entrance Stairs-Rails: None Entrance Stairs-Number of Steps: 3 Home Layout: One level Home Equipment: Environmental consultant - 2 wheels;Cane - single point;Other (comment) (knee scooter)      Prior Function Level of Independence: Independent         Comments: reports having to wear steel tipped shoes to work     Engineer, manufacturing  [place]        Extremity/Trunk Assessment        Lower Extremity Assessment Lower Extremity Assessment: Overall WFL for tasks assessed    Cervical / Trunk Assessment Cervical / Trunk Assessment: Normal  Communication   Communication: No difficulties  Cognition Arousal/Alertness: Awake/alert Behavior During Therapy: WFL for tasks assessed/performed Overall Cognitive Status: Within Functional Limits for tasks assessed                                         General Comments      Exercises     Assessment/Plan    PT Assessment Patent does not need any further PT services  PT Problem List         PT Treatment Interventions      PT Goals (Current goals can be found in the Care Plan section)  Acute Rehab PT Goals PT Goal Formulation: All assessment and education complete, DC therapy    Frequency     Barriers to discharge        Co-evaluation               AM-PAC PT "6 Clicks" Mobility  Outcome Measure Help needed turning from your back to your side while in a flat bed without using bedrails?: None Help needed moving from lying on your back to sitting on the side of a flat bed without using bedrails?: None Help needed moving to and from a bed to a chair (including a wheelchair)?: None Help needed standing up from a chair using your arms (e.g., wheelchair or bedside chair)?: None Help needed to walk in hospital room?: A Little Help needed climbing 3-5 steps with a railing? : A Little 6 Click Score: 22    End of Session Equipment Utilized During Treatment: Gait belt Activity Tolerance: Patient tolerated treatment well Patient left: in chair;with call bell/phone within reach Nurse Communication: Mobility status PT Visit Diagnosis: Difficulty in walking, not elsewhere classified (R26.2)    Time: [id number] PT Time Calculation (min) (ACUTE ONLY): 12 min   Charges:   PT Evaluation $PT Eval Low Complexity: 1 Low        Kati PT, DPT Acute Rehabilitation Services Pager: [phone] Office: [phone]   York Ram E 09/06/2020, 2:58 PM

## 2020-09-07 ENCOUNTER — Inpatient Hospital Stay: Payer: Self-pay

## 2020-09-07 DIAGNOSIS — E11628 Type 2 diabetes mellitus with other skin complications: Secondary | ICD-10-CM | POA: Diagnosis not present

## 2020-09-07 DIAGNOSIS — E11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ical complication: Secondary | ICD-10-CM | POA: Diagnosis not present

## 2020-09-07 DIAGNOSIS — M86671 Other chronic osteomyelitis, right ankle and foot: Secondary | ICD-10-CM | POA: Diagnosis not present

## 2020-09-07 LAB — GLUCOSE, CAPILLARY
Glucose-Capillary: 137 mg/dL — ABNORMAL HIGH (ref 70–99)
Glucose-Capillary: 119 mg/dL — ABNORMAL HIGH (ref 70–99)
Glucose-Capillary: 146 mg/dL — ABNORMAL HIGH (ref 70–99)
Glucose-Capillary: 146 mg/dL — ABNORMAL HIGH (ref 70–99)

## 2020-09-07 LAB — AEROBIC/ANAEROBIC CULTURE W GRAM STAIN (SURGICAL/DEEP WOUND): Gram Stain: NONE SEEN

## 2020-09-07 LAB — CREATININE, SERUM
Creatinine, Ser: 1.15 mg/dL (ref 0.61–1.24)
GFR, Estimated: >60 mL/min (ref >60)

## 2020-09-07 MED ORDER — RAMIPRIL 5 MG PO CAPS
5.0000 mg | ORAL_CAPSULE | Freq: Every day | ORAL | Status: DC
  Administered 2020-09-07 – 2020-09-08 (×2): 5 mg via ORAL
  Filled 2020-09-07 (×2): qty 1

## 2020-09-07 MED ORDER — HYDROCHLOROTHIAZIDE 12.5 MG PO CAPS
12.5000 mg | ORAL_CAPSULE | Freq: Every day | ORAL | Status: DC
  Administered 2020-09-07 – 2020-09-08 (×2): 12.5 mg via ORAL
  Filled 2020-09-07 (×2): qty 1

## 2020-09-07 MED ORDER — PIPERACILLIN-TAZOBACTAM 3.375 G IVPB
3.3750 g | Freq: Three times a day (TID) | INTRAVENOUS | Status: DC
  Administered 2020-09-07 – 2020-09-08 (×2): 3.375 g via INTRAVENOUS
  Filled 2020-09-07 (×3): qty 50

## 2020-09-07 MED ORDER — PIPERACILLIN-TAZOBACTAM IV (FOR PTA / DISCHARGE USE ONLY): 13.5000 g | Freq: Every day | INTRAVENOUS | 0 refills | Status: AC

## 2020-09-07 NOTE — Progress Notes (Signed)
PHARMACY CONSULT NOTE FOR:  OUTPATIENT  PARENTERAL ANTIBIOTIC THERAPY (OPAT)  Indication: Osteomyelitis  Regimen: Piperacillin-tazobactam 13.5g Daily (as continuous infusion) End date: 10/15/2020  IV antibiotic discharge orders are pended. To discharging provider:  please sign these orders via discharge navigator,  Select New Orders & click on the button choice - Manage This Unsigned Work.     Thank you for allowing pharmacy to be a part of this patient's care.  Levonne Spiller 09/07/2020, 2:57 PM

## 2020-09-07 NOTE — Progress Notes (Signed)
Diagnosis: Right foot diabetic infection/om   Abx: 8/12-c vanc 8/12-c cefepime                                                              Assessment: 50 yo male uncontrolled dm2, htn/hlp, pituitary macroadenoma, pvd, s/p right 5th and 4th partial ray 4 years prior to admission, admitted by podiatry referal for worsening right foot ulcer/om. Mri confirmed multifocal OM of the cuboid, 4th metatarsal, distal phalanx hallux, phalanges of the 3rd toe   It appears patient was taking doxycycline prior to admission   Patient is s/p I&D with abx bead placement on 8/15. Cx in progress. No blood cx obtained this admission. Patient had no fever this admission. Wbc is a little elevated immediately post-op  8/19 assessment Cx reviewed --> pseudomonas (S cipro), bacteroides, mssa (R tetra), bacteroides, corynebacterium (not striatum)  Given pseudomonas, would like to try IV abx then reevaluate in 2-3 weeks for transitioning to oral abx  Abi normal    Plan: Stop vanc/cefepime Start piptazo; OPAT order for 6 weeks from 8/15  Id clinic follow up as below Discussed with primary team Will sign off  OPAT Orders Discharge antibiotics to be given via PICC line Discharge antibiotics: pip-tazo End Date: 10/15/20  [facility] Care Per Protocol:  Home health RN for IV administration and teaching; PICC line care and labs.    Labs weekly while on IV antibiotics: _x_ CBC with differential __ BMP _x_ CMP _x_ CRP __ ESR __ Vancomycin trough __ CK  _x_ Please pull PIC at completion of IV antibiotics __ Please leave PIC in place until doctor has seen patient or been notified  Fax weekly labs to [phone]  Clinic Follow Up Appt: 8/31 @ 230pm  @  [facility] [place], [place], [place] [address] Phone: [phone]    I spent more than 35 minute reviewing data/chart, and coordinating care and >50% direct face to face time providing counseling/discussing diagnostics/treatment  plan with patient    Subjective: Doing well No n/v/diarrhea/rash No fever/chill Discussed culture result with patient and abx plan    Exam: Wt Readings from Last 3 Encounters:  09/01/20 (!) 143.5 kg  08/20/20 (!) 144.7 kg  06/25/20 (!) 143.5 kg   Temp Readings from Last 3 Encounters:  09/07/20 98.1 F (36.7 C) (Oral)  08/31/20 98.2 F (36.8 C) (Oral)  06/25/20 98.8 F (37.1 C) (Temporal)   BP Readings from Last 3 Encounters:  09/07/20 125/81  08/31/20 (!) 162/90  06/25/20 136/88   Pulse Readings from Last 3 Encounters:  09/07/20 77  06/25/20 77  02/24/20 77   General/constitutional: no distress, pleasant HEENT: Normocephalic, PER, Conj Clear, EOMI, Oropharynx clear Neck supple CV: rrr no mrg Lungs: clear to auscultation, normal respiratory effort Abd: Soft, Nontender Ext: no edema Skin: No Rash Neuro: nonfocal MSK: right foot bulky dressing c/d  Labs: Lab Results  Component Value Date   WBC 9.6 09/05/2020   HGB 12.0 (L) 09/05/2020   HCT 38.1 (L) 09/05/2020   MCV 81.1 09/05/2020   PLT 281 71/21/9758   Last metabolic panel Lab Results  Component Value Date   GLUCOSE 147 (H) 09/05/2020   NA 135 09/05/2020   K 4.2 09/05/2020   CL 102 09/05/2020   CO2  26 09/05/2020   BUN 16 09/05/2020   CREATININE 1.15 09/07/2020   GFRNONAA >60 09/07/2020   GFRAA 68 12/09/2019   CALCIUM 9.1 09/05/2020   PROT 7.2 09/05/2020   ALBUMIN 3.1 (L) 09/05/2020   BILITOT 1.1 09/05/2020   ALKPHOS 67 09/05/2020   AST 22 09/05/2020   ALT 30 09/05/2020   ANIONGAP 7 09/05/2020      Component Value Date/Time   CRP 7.1 12/09/2019 0926   CRP 9.7 (H) 11/09/2019 1533   CRP 22.9 (H) 07/29/2016 0226   Imaging: 1. Acute osteomyelitis involving the proximal and middle phalanx of the right third toe. Reactive osteitis involving the distal phalanx of the third toe. 2. Acute osteomyelitis of the cuboid with erosive changes along its plantar margin. 3. Acute osteomyelitis  involving the residual fourth metatarsal base. 4. Findings suggestive of early acute osteomyelitis involving the distal phalanx of the great toe. 5. Charcot arthropathy within the midfoot. 6. Broad soft tissue wound/ulceration along the lateral aspect of the forefoot. Ill-defined fluid along the lateral forefoot without well-defined or rim enhancing fluid collection.

## 2020-09-07 NOTE — Progress Notes (Addendum)
PROGRESS NOTE  [name] [id number] DOB: Oct 22, 1970 DOA: 08/31/2020 PCP: Marin Olp, MD  Brief History   50 year old man PMH diabetes mellitus with diabetic neuropathy presented with right foot osteomyelitis, status post surgery 8/16.  A & P  Right foot osteomyelitis, cellulitis --will continue antibiotics per infectious disease, await culture sensitivities, growing Staph and Pseudomonas --Continue wound management per podiatry  Diabetes mellitus type 2 with peripheral neuropathy, last hemoglobin A1c 6.5 --long-acting on hold secondary to normal CBG --remains stable, will continue sliding scale insulin, can resume long-acting insulin on discharge as oral intake improves  Essential hypertension --can resume hydrochlorothiazide and ramipril   Morbid obesity --Juven BID, Prosource per dietician --outpt referral to [facility]  Disposition Plan:  Discussion: home when final antibiotic regimen determined  Status is: Inpatient  Remains inpatient appropriate because:IV treatments appropriate due to intensity of illness or inability to take PO and Inpatient level of care appropriate due to severity of illness  Dispo: The patient is from: Home              Anticipated d/c is to: Home              Patient currently is not medically stable to d/c.   Difficult to place patient No  DVT prophylaxis: enoxaparin (LOVENOX) injection 40 mg Start: 09/01/20 1000   Code Status: Full Code Level of care: Med-Surg Family Communication: none  [name], MD  [facility] Direct contact: see [email or web address] (further directions at bottom of note if needed) 7PM-7AM contact night coverage as at bottom of note 09/07/2020, 8:57 AM  LOS: 7 days     Consults:  Podiatry ID   Procedures:      1) Right foot distal phalanx great toe bone biopsy             2) Debridement of wound right foot - to bone             3) Right 3rd toe amputation             4) Insertion of abx  beads  Significant Diagnostic Tests:  MR right foot acute osteo right third toe, cuboid, 4th metatarsal, distal phalanx great toe, charcot arthropathy   Micro Data:  Wound culture pending   Antimicrobials:  Per ID  Interval History/Subjective  CC: f/u foot infection  No foot pain Feels fine except some back stiffness from lying in bed  Objective   Vitals:  Vitals:   09/06/20 2115 09/07/20 0624  BP: 127/83 122/77  Pulse: 91 73  Resp: 18 17  Temp: 98.4 F (36.9 C) 98.3 F (36.8 C)  SpO2: 100% 100%    Exam: Physical Exam Vitals reviewed.  Constitutional:      General: He is not in acute distress.    Appearance: Normal appearance. He is not ill-appearing or toxic-appearing.  Cardiovascular:     Rate and Rhythm: Normal rate and regular rhythm.     Heart sounds: No murmur heard. Pulmonary:     Effort: Pulmonary effort is normal. No respiratory distress.     Breath sounds: Normal breath sounds. No wheezing or rales.  Skin:    Comments: Right foot dressed  Neurological:     Mental Status: He is alert.  Psychiatric:        Mood and Affect: Mood normal.        Behavior: Behavior normal.    I have personally reviewed the labs and other data, making special note of:  Today's Data  CBG remains stable  Scheduled Meds:  (feeding supplement) PROSource Plus  30 mL Oral TID BM   ascorbic acid  500 mg Oral Daily   aspirin EC  81 mg Oral Daily   atorvastatin  60 mg Oral Daily   enoxaparin (LOVENOX) injection  40 mg Subcutaneous Q24H   fenofibrate  54 mg Oral Daily   hydrALAZINE  50 mg Oral Q8H   insulin aspart  0-20 Units Subcutaneous TID WC   insulin aspart  0-5 Units Subcutaneous QHS   multivitamin with minerals  1 tablet Oral BID   nutrition supplement (JUVEN)  1 packet Oral BID BM   omega-3 acid ethyl esters  1 g Oral Daily   pyridoxine  100 mg Oral BID   vitamin B-12  5,000 mcg Oral Daily   Continuous Infusions:  sodium chloride 10 mL/hr at 09/06/20 0319    ceFEPime (MAXIPIME) IV 2 g (09/07/20 0211)   vancomycin      Principal Problem:   Osteomyelitis ([place]) Active Problems:   Type II diabetes mellitus with neurological manifestations ([place])   Hyperlipidemia associated with type 2 diabetes mellitus ([place])   Hypertension associated with diabetes ([place])   Morbid obesity ([place])   Diabetic foot infection ([place])   Chronic kidney disease (CKD), stage III (moderate) ([place])   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  Check the care team in [facility] and look for a) attending/consulting TRH provider listed and b) the [facility] team listed Log into [email or web address] and use [place]'s universal password to access. If you do not have the password, please contact the hospital operator. Locate the [facility] provider you are looking for under [facility] and page to a number that you can be directly reached. If you still have difficulty reaching the provider, please page the [facility] (Director on Call) for the Hospitalists listed on amion for assistance.

## 2020-09-07 NOTE — Plan of Care (Signed)
  Problem: Activity: Goal: Risk for activity intolerance will decrease Outcome: Progressing   Problem: Elimination: Goal: Will not experience complications related to bowel motility Outcome: Progressing   Problem: Pain Managment: Goal: General experience of comfort will improve Outcome: Progressing   Problem: Safety: Goal: Ability to remain free from injury will improve Outcome: Progressing   

## 2020-09-07 NOTE — TOC Initial Note (Signed)
Transition of Care [facility]) - Initial/Assessment Note   Patient Details  Name: [name] MRN: [id number] Date of Birth: 1970-03-18  [facility]) CM/SW Contact:    Sherie Don, LCSW Phone Number: 09/07/2020, 3:41 PM  Clinical Narrative: Patient will need to discharge home with IV antibiotics and has previously had [facility]. Referral made to [facility] with [facility]; [facility] to set up [facility] through [place] or [employer]. TOC to follow.  Expected Discharge Plan: [place] Barriers to Discharge: Continued Medical Work up  Patient Goals and CMS Choice Patient states their goals for this hospitalization and ongoing recovery are:: Discharge home with IV antibiotics CMS Medicare.gov Compare Post Acute Care list provided to:: Patient Choice offered to / list presented to : Patient  Expected Discharge Plan and Services Expected Discharge Plan: [place] In-house Referral: Clinical Social Work Post Acute Care Choice: [place] arrangements for the past 2 months: [place]            DME Arranged: N/A DME Agency: NA HH Arranged: IV Antibiotics, RN HH Agency: [facility] Date [name]: 09/07/20 Representative spoke with at [place]: Jeannene Patella ([facility] to set up [facility])  Prior Living Arrangements/Services Living arrangements for the past 2 months: [place] Lives with:: Spouse Patient language and need for interpreter reviewed:: Yes Do you feel safe going back to the place where you live?: Yes      Need for Family Participation in Patient Care: No (Comment) Care giver support system in place?: Yes (comment) Criminal Activity/Legal Involvement Pertinent to Current Situation/Hospitalization: No - Comment as needed  Activities of Daily Living Home Assistive Devices/Equipment: None ADL Screening (condition at time of admission) Patient's cognitive ability adequate to safely complete daily activities?: Yes Is the  patient deaf or have difficulty hearing?: No Does the patient have difficulty seeing, even when wearing glasses/contacts?: No Does the patient have difficulty concentrating, remembering, or making decisions?: No Patient able to express need for assistance with ADLs?: Yes Does the patient have difficulty dressing or bathing?: No Independently performs ADLs?: Yes (appropriate for developmental age) Walks in Home: Independent Does the patient have difficulty walking or climbing stairs?: No Weakness of Legs: None Weakness of Arms/Hands: None  Admission diagnosis:  Osteomyelitis ([place]) [M86.9] Patient Active Problem List   Diagnosis Date Noted   Osteomyelitis ([place]) 08/31/2020   Occlusion of peripherally inserted central catheter (PICC) line ([place]) 01/10/2020   Diabetic foot ulcer ([place]) 11/18/2019   Charcot's joint of foot, right 11/18/2019   Pyogenic inflammation of bone ([place]) 11/09/2019   Osteomyelitis of ankle or foot, acute, right ([place]) 11/09/2019   Diabetic retinopathy ([place]) 01/25/2018   Status post split thickness skin graft 12/08/2016   B12 deficiency 11/17/2016   Iron deficiency anemia 11/17/2016   Chronic kidney disease (CKD), stage III (moderate) ([place]) 11/17/2016   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 ([place]) 11/17/2016   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 ([place]) 11/17/2016   Diabetic neuropathy ([place]) 11/17/2016   Elevated LFTs    Diabetic foot infection ([place]) 07/29/2016   Hyperlipidemia associated with type 2 diabetes mellitus ([place]) 03/08/2015   Hypertension associated with diabetes ([place]) 03/08/2015   Morbid obesity ([place]) 03/08/2015   Type II diabetes mellitus with neurological manifestations ([place]) 03/07/2015   PCP:  Marin Olp, MD Pharmacy:   [id number] [address]  [place] [address] Phone: [phone] Fax: [phone]  Readmission Risk Interventions No  flowsheet data found.

## 2020-09-07 NOTE — Progress Notes (Signed)
Subjective:  Patient ID: [name], male    DOB: Apr 27, 1970,  MRN: [id number]  Patient seen bedside. Pain controlled. Worked with PT, denies issues ambulating with the boot. Denies complaints. Objective:   Vitals:   09/07/20 0624 09/07/20 1424  BP: 122/77 125/81  Pulse: 73 77  Resp: 17 16  Temp: 98.3 F (36.8 C) 98.1 F (36.7 C)  SpO2: 100% 100%   General AA&O x3. Normal mood and affect.  Vascular Right foot warm and well perfused.  Neurologic Epicritic sensation grossly intact.  Dermatologic Dressing c/d/I. Not changed today.  Orthopedic: +Motor present to the toes.   Results for orders placed or performed during the hospital encounter of 08/31/20  SARS CORONAVIRUS 2 (TAT 6-24 HRS) Nasopharyngeal Nasopharyngeal Swab     Status: None   Collection Time: 08/31/20 11:42 PM   Specimen: Nasopharyngeal Swab  Result Value Ref Range Status   SARS Coronavirus 2 NEGATIVE NEGATIVE Final    Comment: (NOTE) SARS-CoV-2 target nucleic acids are NOT DETECTED.  The SARS-CoV-2 RNA is generally detectable in upper and lower respiratory specimens during the acute phase of infection. Negative results do not preclude SARS-CoV-2 infection, do not rule out co-infections with other pathogens, and should not be used as the sole basis for treatment or other patient management decisions. Negative results must be combined with clinical observations, patient history, and epidemiological information. The expected result is Negative.  Fact Sheet for Patients: SugarRoll.be  Fact Sheet for Healthcare Providers: https://www.woods-mathews.com/  This test is not yet approved or cleared by the [place] FDA and  has been authorized for detection and/or diagnosis of SARS-CoV-2 by FDA under an Emergency Use Authorization (EUA). This EUA will remain  in effect (meaning this test can be used) for the duration of the COVID-19 declaration under Se ction  564(b)(1) of the Act, 21 U.S.C. section 360bbb-3(b)(1), unless the authorization is terminated or revoked sooner.  Performed at [facility], [place] [address] [place], [place] [address]   Surgical pcr screen     Status: None   Collection Time: 09/02/20  6:26 PM   Specimen: Nasal Mucosa; Nasal Swab  Result Value Ref Range Status   MRSA, PCR NEGATIVE NEGATIVE Final   Staphylococcus aureus NEGATIVE NEGATIVE Final    Comment: (NOTE) The Xpert SA Assay (FDA approved for NASAL specimens in patients 75 years of age and older), is one component of a comprehensive surveillance program. It is not intended to diagnose infection nor to guide or monitor treatment. Performed at [facility], [place] [address], [place], [place] [address]   Aerobic/Anaerobic Culture w Gram Stain (surgical/deep wound)     Status: None (Preliminary result)   Collection Time: 09/03/20  6:33 PM   Specimen: Wound  Result Value Ref Range Status   Specimen Description   Final    BONE DISTAL PHALANX RIGHT GREAT TOE Performed at [place] [address] [place], [place] [address]    Special Requests   Final    NONE Performed at [facility], [place] [address] [place], [place] [address]    Gram Stain   Final    NO SQUAMOUS EPITHELIAL CELLS SEEN NO WBC SEEN NO ORGANISMS SEEN Performed at [facility], [place] [address], [place], [place] [address]    Culture   Final    RARE STAPHYLOCOCCUS AUREUS CRITICAL RESULT CALLED TO, READ BACK BY AND VERIFIED WITH: DR Corin Tilly VIA EPIC 1409 [id number] FCP NO ANAEROBES  ISOLATED; CULTURE IN PROGRESS FOR 5 DAYS    Report Status PENDING  Incomplete   Organism ID, Bacteria STAPHYLOCOCCUS AUREUS  Final      Susceptibility   Staphylococcus aureus - MIC*    CIPROFLOXACIN <=0.5 SENSITIVE Sensitive     ERYTHROMYCIN >=8 RESISTANT Resistant     GENTAMICIN <=0.5 SENSITIVE Sensitive     OXACILLIN <=0.25 SENSITIVE Sensitive      TETRACYCLINE >=16 RESISTANT Resistant     VANCOMYCIN <=0.5 SENSITIVE Sensitive     TRIMETH/SULFA <=10 SENSITIVE Sensitive     CLINDAMYCIN <=0.25 SENSITIVE Sensitive     RIFAMPIN <=0.5 SENSITIVE Sensitive     Inducible Clindamycin NEGATIVE Sensitive     * RARE STAPHYLOCOCCUS AUREUS  Aerobic/Anaerobic Culture w Gram Stain (surgical/deep wound)     Status: None   Collection Time: 09/03/20  6:37 PM   Specimen: Wound  Result Value Ref Range Status   Specimen Description   Final    BONE CUBOID Performed at [place] [address] [place], [place] [address]    Special Requests   Final    NONE Performed at [facility], [place] [address] [place], [place] [address]    Gram Stain   Final    NO SQUAMOUS EPITHELIAL CELLS SEEN NO WBC SEEN NO ORGANISMS SEEN    Culture   Final    FEW PSEUDOMONAS AERUGINOSA CRITICAL RESULT CALLED TO, READ BACK BY AND VERIFIED WITH: DR Mitsuye Schrodt VIA EPIC 1409 [id number] FCP RARE BACTEROIDES FRAGILIS BETA LACTAMASE POSITIVE RARE CORYNEBACTERIUM AMYCOLATUM Standardized susceptibility testing for this organism is not available. Performed at [facility], [place] [address], [place], [place] [address]    Report Status 09/07/2020 FINAL  Final  Aerobic/Anaerobic Culture w Gram Stain (surgical/deep wound)     Status: None   Collection Time: 09/03/20  6:46 PM   Specimen: Wound  Result Value Ref Range Status   Specimen Description   Final    WOUND FOOT RIGHT Performed at [place] [address] [place], [place] [address]    Special Requests   Final    NONE Performed at [facility], [place] [address] [place], [place] [address]    Gram Stain   Final    FEW SQUAMOUS EPITHELIAL CELLS PRESENT FEW WBC SEEN FEW GRAM POSITIVE COCCI    Culture   Final    ABUNDANT PSEUDOMONAS AERUGINOSA FEW BACTEROIDES FRAGILIS BETA LACTAMASE POSITIVE Performed at [facility], [place] [address] [place], [place] [address]    Report Status 09/07/2020 FINAL  Final   Organism ID, Bacteria PSEUDOMONAS AERUGINOSA  Final      Susceptibility   Pseudomonas aeruginosa - MIC*    CEFTAZIDIME 2 SENSITIVE Sensitive     CIPROFLOXACIN <=0.25 SENSITIVE Sensitive     GENTAMICIN <=1 SENSITIVE Sensitive     IMIPENEM 2 SENSITIVE Sensitive     PIP/TAZO <=4 SENSITIVE Sensitive     CEFEPIME 2 SENSITIVE Sensitive     * ABUNDANT PSEUDOMONAS AERUGINOSA    Assessment & Plan:  Patient was evaluated and treated and all questions answered.  Right foot infection, OM -Micro/path finalized. Growing Staph, pseuodomonas. -Dressingleft intact. -Plan for 6w abx likely with PICC. Awaiting cultures to determine agents. Appreciate ID reccs. -No further surgery planned at this time. He will be ok for d/c from podiatry standpoint once abx plan finalized. -WBAT RLE in CAM boot. -Ok for d/c from podiatry standpoint once home Abx plan  determined. F/u in the office next week for post-op check. [phone] for appointment.  [name], DPM  Accessible via secure chat for questions or concerns.

## 2020-09-08 DIAGNOSIS — M86171 Other acute osteomyelitis, right ankle and foot: Secondary | ICD-10-CM

## 2020-09-08 DIAGNOSIS — E11628 Type 2 diabetes mellitus with other skin complications: Secondary | ICD-10-CM | POA: Diagnosis not present

## 2020-09-08 DIAGNOSIS — L97514 Non-pressure chronic ulcer of other part of right foot with necrosis of bone: Secondary | ICD-10-CM | POA: Diagnosis not present

## 2020-09-08 DIAGNOSIS — M86671 Other chronic osteomyelitis, right ankle and foot: Secondary | ICD-10-CM | POA: Diagnosis not present

## 2020-09-08 DIAGNOSIS — E11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ical complication: Secondary | ICD-10-CM | POA: Diagnosis not present

## 2020-09-08 LAB — AEROBIC/ANAEROBIC CULTURE W GRAM STAIN (SURGICAL/DEEP WOUND): Gram Stain: NONE SEEN

## 2020-09-08 LAB — GLUCOSE, CAPILLARY
Glucose-Capillary: 154 mg/dL — ABNORMAL HIGH (ref 70–99)
Glucose-Capillary: 115 mg/dL — ABNORMAL HIGH (ref 70–99)

## 2020-09-08 LAB — CREATININE, SERUM
Creatinine, Ser: 1.38 mg/dL — ABNORMAL HIGH (ref 0.61–1.24)
GFR, Estimated: >60 mL/min (ref >60)

## 2020-09-08 MED ORDER — SODIUM CHLORIDE 0.9% FLUSH: 10.0000 mL | INTRAVENOUS | Status: DC | PRN

## 2020-09-08 MED ORDER — HEPARIN SOD (PORK) LOCK FLUSH 100 UNIT/ML IV SOLN
250.0000 [IU] | INTRAVENOUS | Status: DC | PRN
  Filled 2020-09-08: qty 2.5

## 2020-09-08 MED ORDER — HEPARIN SOD (PORK) LOCK FLUSH 100 UNIT/ML IV SOLN
250.0000 [IU] | Freq: Every day | INTRAVENOUS | Status: DC
  Administered 2020-09-08: 250 [IU]
  Filled 2020-09-08: qty 2.5

## 2020-09-08 MED ORDER — CHLORHEXIDINE GLUCONATE CLOTH 2 % EX PADS: 6.0000 | MEDICATED_PAD | Freq: Every day | CUTANEOUS | Status: DC

## 2020-09-08 MED ORDER — TRESIBA 100 UNIT/ML ~~LOC~~ SOLN: 78.0000 [IU] | Freq: Every evening | SUBCUTANEOUS | Status: DC

## 2020-09-08 MED ORDER — SODIUM CHLORIDE 0.9% FLUSH
10.0000 mL | Freq: Two times a day (BID) | INTRAVENOUS | Status: DC
Start: 2020-09-08 — End: 2020-09-08

## 2020-09-08 NOTE — Discharge Summary (Addendum)
Male with Physician Discharge Summary  [name] HTD:[id number] DOB: June 25, 1970 DOA: 08/31/2020  PCP: [name] Olp, MD  Admit date: 08/31/2020 Discharge date: 09/08/2020  Recommendations for Outpatient Follow-up:  Right foot osteomyelitis, cellulitis --Staph aureus and Pseudomonas --Continue wound management per podiatry, follow-up with podiatry as directed --follow-up with ID 8/31 @ 230pm   Morbid obesity --outpt referral to [facility]   Follow-up Information     [facility] Follow up.   Why: IV antibiotics        [name] Olp, MD. Schedule an appointment as soon as possible for a visit in 2 week(s).   Specialty: Family Medicine Contact information: [place] [address] [phone]         [name] Mutton, MD Follow up on 09/19/2020.   Specialty: Infectious Diseases Why: 2:30 PM Contact information: 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 Yes  Diet effective now       Question Answer Comment  Diet-HS Snack? Nothing   Calorie Level Medium 1600-2000   Fluid  consistency: Thin   Room service appropriate? Yes      09/03/20 2037             Filed Weights   09/01/20 1300  Weight: (!) 143.5 kg    HPI/Hospital Course:   50 year old man PMH diabetes mellitus with diabetic neuropathy presented with right foot osteomyelitis, status post surgery 8/16.  Treated with IV antibiotics, based on culture data, seen by infectious disease, cleared for discharge with outpatient follow-up.  Seen by PT, no outpatient needs identified.  Right foot osteomyelitis, cellulitis --Staph aureus and Pseudomonas --Continue wound management per podiatry, follow-up with podiatry as directed --follow-up with ID 8/31 @ 230pm   Diabetes mellitus type 2 with peripheral neuropathy, hemoglobin A1c 6.5 --long-acting on hold secondary to stable CBG. -- Discussed with patient to start back insulin long-acting at lower dose and titrate back to usual dose as diet improves.  CKD stage II with diabetic nephropathy -- Appears stable.  Follow-up as an outpatient.   Essential hypertension --can resume hydrochlorothiazide and ramipril    Morbid obesity --outpt referral to [facility]  Consults:  Podiatry ID   Procedures:      1) Right foot distal phalanx great toe bone biopsy             2) Debridement of wound right foot - to bone             3)  Right 3rd toe amputation             4) Insertion of abx beads   Significant Diagnostic Tests:  MR right foot acute osteo right third toe, cuboid, 4th metatarsal, distal phalanx great toe, charcot arthropathy   Micro Data:  Wound culture pending   Antimicrobials:  Per ID  Today's assessment: S: CC: f/u foot infection  Feels well, no complaints  O: Vitals:  Vitals:   09/07/20 2122 09/08/20 0514  BP: 120/68 132/83  Pulse: 78 67  Resp: 20 17  Temp: 98 F (36.7 C) 98.2 F (36.8 C)  SpO2: 100% 100%    Constitutional:  Appears calm and comfortable Respiratory:  CTA bilaterally, no w/r/r.  Respiratory effort  normal. Cardiovascular:  RRR, no m/r/g Skin:  Right foot dressed Psychiatric:  Mental status Mood, affect appropriate  CBG stable  Discharge Instructions  Discharge Instructions     [facility] pharmacist to adjust dose for Vancomycin, Aminoglycosides and other anti-infective therapies as requested by physician.   Complete by: As directed    [facility] to provide Cath Flo 76m   Complete by: As directed    Administer for PICC line occlusion and as ordered by physician for other access device issues.   Anaphylaxis Kit: Provided to treat any anaphylactic reaction to the medication being provided to the patient if First Dose or when requested by physician   Complete by: As directed    Epinephrine 148mml vial / amp: Administer 0.58m41m0.58ml28mubcutaneously once for moderate to severe anaphylaxis, nurse to call physician and pharmacy when reaction occurs and call 911 if needed for immediate care   Diphenhydramine 50mg18mIV vial: Administer 25-50mg 8mM PRN for first dose reaction, rash, itching, mild reaction, nurse to call physician and pharmacy when reaction occurs   Sodium Chloride 0.9% NS 500ml I41mdminister if needed for hypovolemic blood pressure drop or as ordered by physician after call to physician with anaphylactic reaction   Change dressing on IV access line weekly and PRN   Complete by: As directed    Diet - low sodium heart healthy   Complete by: As directed    Diet Carb Modified   Complete by: As directed    Discharge instructions   Complete by: As directed    Call your physician or seek immediate medical attention for pain, fever, drainage, low blood sugar <70 or higher than 400 or worsening of condition. May bear weight as tolerated right foot in CAM boot.   Discharge wound care:   Complete by: As directed    Cleanse wound and pat dry. Apply xeroform to the plantar aspect of the great toe, lateral aspect of 2nd toe, plantar foot ulcerations.  Dress with kerlix and ACE bandage. Change daily.   Flush IV access with Sodium Chloride 0.9% and Heparin 10 units/ml or 100 units/ml   Complete by: As directed    Home infusion instructions - [facility]   Complete by: As directed    Instructions: Flush IV access with Sodium Chloride 0.9% and Heparin 10units/ml or 100units/ml   Change dressing on IV access line: Weekly and PRN   Instructions Cath Flo 2mg: Ad21mister for PICC Line occlusion and as ordered by physician for other access device   [facility] pharmacist to adjust dose for: Vancomycin, Aminoglycosides and other anti-infective therapies as requested by physician   Increase activity slowly   Complete by: As directed    Method of administration may  be changed at the discretion of home infusion pharmacist based upon assessment of the patient and/or caregiver's ability to self-administer the medication ordered   Complete by: As directed    Referral to Nutrition and Diabetes Services   Complete by: As directed    Choose type of Diabetes Self-Management Training (DSMT) training services and number of hours requested: Initial DSMT: 10 hours   Check all special needs that apply to patient requiring 1 on 1 DSMT: Does not apply   DSMT Content: Basic nutrition management   Choose the type of Medical Nutrition Therapy (MNT) and number of hours: Initial MNT: 3 hours   FOR MEDICARE PATIENTS: I hereby certify that I am managing this beneficiary's diabetes condition and that the above prescribed training is a necessary part of management.: Does not apply      Allergies as of 09/08/2020   No Known Allergies      Medication List     STOP taking these medications    doxycycline 100 MG tablet Commonly known as: VIBRA-TABS   ferrous sulfate 325 (65 FE) MG tablet   naproxen sodium 220 MG tablet Commonly known as: ALEVE       TAKE these medications    Accu-Chek Guide test strip Generic drug: glucose blood Dx  E11.49 - Use to check blood glucose three times daily or as directed.   ascorbic acid 500 MG tablet Commonly known as: VITAMIN C Take 500 mg by mouth daily.   aspirin EC 81 MG tablet Take 81 mg by mouth daily.   atorvastatin 40 MG tablet Commonly known as: LIPITOR TAKE 1 AND 1/2 TABLETS BY MOUTH EVERY MORNING What changed:  how much to take how to take this when to take this additional instructions   BD Veo Insulin Syr U/F 1/2Unit 31G X 15/64" 0.3 ML Misc Generic drug: Insulin Syringe-Needle U-100 USE TO INJECT INSULIN FOUR TIMES DAILY AS DIRECTED   Insulin Syringe-Needle U-100 31G X 5/16" 0.5 ML Misc Commonly known as: BD Insulin Syringe U/F Use to inject insulin 4 times daily   Insulin Syringe-Needle U-100 31G X 5/16" 1 ML Misc Commonly known as: BD Insulin Syringe U/F Use to inject tresiba/long acting insulin once daily   fenofibrate 48 MG tablet Commonly known as: TRICOR TAKE 1 TABLET BY MOUTH EVERY DAY What changed:  how much to take how to take this when to take this additional instructions   Fish Oil 1360 MG Caps Take 1,360 mg by mouth daily.   hydrochlorothiazide 12.5 MG capsule Commonly known as: MICROZIDE TAKE 1 CAPSULE BY MOUTH EVERY DAY IN THE MORNING What changed:  how much to take how to take this when to take this additional instructions   insulin aspart 100 UNIT/ML injection Commonly known as: NovoLOG INJECT 15 TO 25 UNITS UNDER THE SKIN TWICE DAILY What changed:  how much to take how to take this when to take this additional instructions   Januvia 50 MG tablet Generic drug: sitaGLIPtin TAKE 1 TABLET BY MOUTH EVERY DAY IN THE MORNING What changed: See the new instructions.   Lancets Misc USED TO CHECK BLOOD GLUCOSE 3-4 TIMES DAILY   onetouch ultrasoft lancets Use to test blood sugars daily. Dx: E11.9   multivitamin with minerals Tabs tablet Take 1 tablet by mouth 2 (two) times daily.   piperacillin-tazobactam  IVPB Commonly  known as: ZOSYN Inject 13.5 g into the vein daily. As a continuous infusion. Indication: osteomyelitis  First Dose: No Last Day of Therapy:  10/15/2020 Labs - Once weekly:  CBC/D and BMP, Labs - Every other week:  ESR and CRP Method of administration: Elastomeric (Continuous infusion) Method of administration may be changed at the discretion of home infusion pharmacist based upon assessment of the patient and/or caregiver's ability to self-administer the medication ordered.   pyridoxine 100 MG tablet Commonly known as: B-6 Take 100 mg by mouth 2 (two) times daily.   ramipril 5 MG capsule Commonly known as: ALTACE TAKE 1 CAPSULE BY MOUTH EVERY DAY IN THE MORNING What changed:  how much to take how to take this when to take this additional instructions   Tresiba 100 UNIT/ML Soln Generic drug: Insulin Degludec Inject 78-86 Units into the skin at bedtime. Per sliding scale   Vitamin B-12 5000 MCG Subl Place 5,000 mcg under the tongue daily.               Discharge Care Instructions  (From admission, onward)           Start     Ordered   09/08/20 0000  Discharge wound care:       Comments: Cleanse wound and pat dry. Apply xeroform to the plantar aspect of the great toe, lateral aspect of 2nd toe, plantar foot ulcerations. Dress with kerlix and ACE bandage. Change daily.   09/08/20 1009   09/07/20 0000  Change dressing on IV access line weekly and PRN  (Home infusion instructions - [facility] )        09/07/20 1630           No Known Allergies  The results of significant diagnostics from this hospitalization (including imaging, microbiology, ancillary and laboratory) are listed below for reference.    Significant Diagnostic Studies: MR FOOT RIGHT W WO CONTRAST  Result Date: 09/01/2020 CLINICAL DATA:  Diabetic foot wound EXAM: MRI OF THE RIGHT FOREFOOT WITHOUT AND WITH CONTRAST TECHNIQUE: Multiplanar, multisequence MR imaging of the right forefoot  was performed before and after the administration of intravenous contrast. CONTRAST:  28m GADAVIST GADOBUTROL 1 MMOL/ML IV SOLN COMPARISON:  X-ray 08/31/2020 FINDINGS: Status post fourth and fifth ray resection. Broad soft tissue wound/ulceration along the lateral aspect of the forefoot. Marked bone marrow edema with confluent low T1 marrow signal throughout the proximal and middle phalanx of the right third toe (series 7, images 15-16), compatible with acute osteomyelitis. Findings suggestive of reactive osteitis involving the distal phalanx of the third toe. Minimal subcortical marrow edema involving the third metatarsal head without evidence of acute osteomyelitis. Marked bone marrow edema and confluent low T1 signal throughout the cuboid with erosive changes along its plantar margin (series 7, images 13-17), compatible with acute osteomyelitis. Bone marrow edema throughout the residual fourth metatarsal base with subtle erosive changes along its lateral margin, also suggestive of osteomyelitis. Bone marrow edema is present throughout the distal phalanx of the great toe with intermediate T1 marrow signal in the distal tuft concerning for early acute osteomyelitis. Similar findings of Charcot arthropathy within the midfoot. Chronic denervation changes with probable myositis of the intrinsic foot musculature. Diffusely edematous appearance of the soft tissues. There is ill-defined fluid along the lateral margin of the forefoot. No well-defined or rim enhancing fluid collection. IMPRESSION: 1. Acute osteomyelitis involving the proximal and middle phalanx of the right third toe. Reactive osteitis involving the distal phalanx of the third toe. 2. Acute osteomyelitis of the cuboid with erosive changes along its plantar margin. 3. Acute osteomyelitis involving the residual fourth metatarsal base.  4. Findings suggestive of early acute osteomyelitis involving the distal phalanx of the great toe. 5. Charcot arthropathy  within the midfoot. 6. Broad soft tissue wound/ulceration along the lateral aspect of the forefoot. Ill-defined fluid along the lateral forefoot without well-defined or rim enhancing fluid collection. Electronically Signed   By: Davina Poke D.O.   On: 09/01/2020 14:12   DG Foot 2 Views Right  Result Date: 09/03/2020 CLINICAL DATA:  Postop. EXAM: RIGHT FOOT - 2 VIEW COMPARISON:  Preoperative imaging FINDINGS: Interval resection of the third toe. Interval resection of the fourth metatarsal base. Antibiotic beads project over the plantar foot adjacent to the cuboid. Skin staples in the operative bed. Plantar calcaneal spur and Achilles tendon enthesophyte. IMPRESSION: Interval resection of the third toe and fourth metatarsal base. Antibiotic beads project over the plantar foot adjacent to the cuboid. Electronically Signed   By: Keith Rake M.D.   On: 09/03/2020 21:29   DG Foot Complete Right  Result Date: 09/03/2020 Please see detailed radiograph report in office note.  DG Foot Complete Right  Result Date: 08/31/2020 Please see detailed radiograph report in office note.  VAS [place] ABI WITH/WO TBI  Result Date: 09/05/2020  LOWER EXTREMITY DOPPLER STUDY Patient Name:  Lawrnce M [name]  Date of Exam:   09/05/2020 Medical Rec #: [id number]       Accession #:    [id number] Date of Birth: 08-15-1970       Patient Gender: M Patient Age:   50 years Exam Location:  [facility] Procedure:      VAS [place] ABI WITH/WO TBI Referring Phys: Benjamine Mola MATHEWS --------------------------------------------------------------------------------  Indications: Chronic DM foot/toe infection s/p RLE 3rd toe amputation. High Risk Factors: Hypertension, hyperlipidemia, Diabetes, no history of                    smoking. Other Factors: CKD, PAD, RLE 4th & 5th toe amputation.  Comparison Study: No previous exams Performing Technologist: Hill, Jody RVT, RDMS  Examination Guidelines: A complete evaluation includes at minimum,  Doppler waveform signals and systolic blood pressure reading at the level of bilateral brachial, anterior tibial, and posterior tibial arteries, when vessel segments are accessible. Bilateral testing is considered an integral part of a complete examination. Photoelectric Plethysmograph (PPG) waveforms and toe systolic pressure readings are included as required and additional duplex testing as needed. Limited examinations for reoccurring indications may be performed as noted.  ABI Findings: +---------+------------------+-----+---------+--------------------------+ Right    Rt Pressure (mmHg)IndexWaveform Comment                    +---------+------------------+-----+---------+--------------------------+ Brachial 138                    triphasic                           +---------+------------------+-----+---------+--------------------------+ PTA      151               1.09 triphasic                           +---------+------------------+-----+---------+--------------------------+ DP       142               1.03 biphasic                            +---------+------------------+-----+---------+--------------------------+  Great Toe                                Unable to assess- bandaged +---------+------------------+-----+---------+--------------------------+ +---------+------------------+-----+---------+-------+ Left     Lt Pressure (mmHg)IndexWaveform Comment +---------+------------------+-----+---------+-------+ Brachial                        triphasicIV      +---------+------------------+-----+---------+-------+ PTA      170               1.23 triphasic        +---------+------------------+-----+---------+-------+ DP       175               1.27 triphasic        +---------+------------------+-----+---------+-------+ Great Toe155               1.12 Normal           +---------+------------------+-----+---------+-------+  +-------+-----------+-----------+------------+------------+ ABI/TBIToday's ABIToday's TBIPrevious ABIPrevious TBI +-------+-----------+-----------+------------+------------+ Right  1.09                                           +-------+-----------+-----------+------------+------------+ Left   1.27       1.12                                +-------+-----------+-----------+------------+------------+   Summary: Right: Resting right ankle-brachial index is within normal range. No evidence of significant right lower extremity arterial disease. Left: Resting left ankle-brachial index is within normal range. No evidence of significant left lower extremity arterial disease. The left toe-brachial index is normal.  *See table(s) above for measurements and observations.  Electronically signed by Harold Barban MD on 09/05/2020 at 10:44:15 PM.    Final    [place] EKG SITE RITE  Result Date: 09/07/2020 If Site Rite image not attached, placement could not be confirmed due to current cardiac rhythm.   Microbiology: Recent Results (from the past 240 hour(s))  SARS CORONAVIRUS 2 (TAT 6-24 HRS) Nasopharyngeal Nasopharyngeal Swab     Status: None   Collection Time: 08/31/20 11:42 PM   Specimen: Nasopharyngeal Swab  Result Value Ref Range Status   SARS Coronavirus 2 NEGATIVE NEGATIVE Final    Comment: (NOTE) SARS-CoV-2 target nucleic acids are NOT DETECTED.  The SARS-CoV-2 RNA is generally detectable in upper and lower respiratory specimens during the acute phase of infection. Negative results do not preclude SARS-CoV-2 infection, do not rule out co-infections with other pathogens, and should not be used as the sole basis for treatment or other patient management decisions. Negative results must be combined with clinical observations, patient history, and epidemiological information. The expected result is Negative.  Fact Sheet for Patients: SugarRoll.be  Fact Sheet  for Healthcare Providers: https://www.woods-mathews.com/  This test is not yet approved or cleared by the [place] FDA and  has been authorized for detection and/or diagnosis of SARS-CoV-2 by FDA under an Emergency Use Authorization (EUA). This EUA will remain  in effect (meaning this test can be used) for the duration of the COVID-19 declaration under Se ction 564(b)(1) of the Act, 21 U.S.C. section 360bbb-3(b)(1), unless the authorization is terminated or revoked sooner.  Performed at [facility], [place] [address] [place], [place] [address]   Surgical  pcr screen     Status: None   Collection Time: 09/02/20  6:26 PM   Specimen: Nasal Mucosa; Nasal Swab  Result Value Ref Range Status   MRSA, PCR NEGATIVE NEGATIVE Final   Staphylococcus aureus NEGATIVE NEGATIVE Final    Comment: (NOTE) The Xpert SA Assay (FDA approved for NASAL specimens in patients 87 years of age and older), is one component of a comprehensive surveillance program. It is not intended to diagnose infection nor to guide or monitor treatment. Performed at [facility], [place] [address] [place], [place] [address]   Aerobic/Anaerobic Culture w Gram Stain (surgical/deep wound)     Status: None (Preliminary result)   Collection Time: 09/03/20  6:33 PM   Specimen: Wound  Result Value Ref Range Status   Specimen Description   Final    BONE DISTAL PHALANX RIGHT GREAT TOE Performed at [place] [address] [place], [place] [address]    Special Requests   Final    NONE Performed at [facility], [place] [address] [place], [place] [address]    Gram Stain   Final    NO SQUAMOUS EPITHELIAL CELLS SEEN NO WBC SEEN NO ORGANISMS SEEN Performed at [facility], [place] [address], [place], [place] [address]    Culture   Final    RARE STAPHYLOCOCCUS AUREUS CRITICAL RESULT CALLED TO, READ BACK BY AND VERIFIED WITH: DR PRICE VIA EPIC 1409  [id number] FCP NO ANAEROBES ISOLATED; CULTURE IN PROGRESS FOR 5 DAYS    Report Status PENDING  Incomplete   Organism ID, Bacteria STAPHYLOCOCCUS AUREUS  Final      Susceptibility   Staphylococcus aureus - MIC*    CIPROFLOXACIN <=0.5 SENSITIVE Sensitive     ERYTHROMYCIN >=8 RESISTANT Resistant     GENTAMICIN <=0.5 SENSITIVE Sensitive     OXACILLIN <=0.25 SENSITIVE Sensitive     TETRACYCLINE >=16 RESISTANT Resistant     VANCOMYCIN <=0.5 SENSITIVE Sensitive     TRIMETH/SULFA <=10 SENSITIVE Sensitive     CLINDAMYCIN <=0.25 SENSITIVE Sensitive     RIFAMPIN <=0.5 SENSITIVE Sensitive     Inducible Clindamycin NEGATIVE Sensitive     * RARE STAPHYLOCOCCUS AUREUS  Aerobic/Anaerobic Culture w Gram Stain (surgical/deep wound)     Status: None   Collection Time: 09/03/20  6:37 PM   Specimen: Wound  Result Value Ref Range Status   Specimen Description   Final    BONE CUBOID Performed at [place] [address], [place], [place] [address]    Special Requests   Final    NONE Performed at [facility], [place] [address], [place], [place] [address]    Gram Stain   Final    NO SQUAMOUS EPITHELIAL CELLS SEEN NO WBC SEEN NO ORGANISMS SEEN    Culture   Final    FEW PSEUDOMONAS AERUGINOSA CRITICAL RESULT CALLED TO, READ BACK BY AND VERIFIED WITH: DR PRICE VIA EPIC 1409 [id number] FCP RARE BACTEROIDES FRAGILIS BETA LACTAMASE POSITIVE RARE CORYNEBACTERIUM AMYCOLATUM Standardized susceptibility testing for this organism is not available. Performed at [facility], [place] [address] [place], [place] [address]    Report Status 09/07/2020 FINAL  Final  Aerobic/Anaerobic Culture w Gram Stain (surgical/deep wound)     Status: None   Collection Time: 09/03/20  6:46 PM   Specimen: Wound  Result Value Ref Range Status   Specimen Description   Final    WOUND FOOT RIGHT Performed at  [facility], [place] [address] [place], [place] [address]     Special Requests   Final    NONE Performed at [facility], [place] [address] [place], [place] [address]    Gram Stain   Final    FEW SQUAMOUS EPITHELIAL CELLS PRESENT FEW WBC SEEN FEW GRAM POSITIVE COCCI    Culture   Final    ABUNDANT PSEUDOMONAS AERUGINOSA FEW BACTEROIDES FRAGILIS BETA LACTAMASE POSITIVE Performed at [facility], [place] [address] [place], [place] [address]    Report Status 09/07/2020 FINAL  Final   Organism ID, Bacteria PSEUDOMONAS AERUGINOSA  Final      Susceptibility   Pseudomonas aeruginosa - MIC*    CEFTAZIDIME 2 SENSITIVE Sensitive     CIPROFLOXACIN <=0.25 SENSITIVE Sensitive     GENTAMICIN <=1 SENSITIVE Sensitive     IMIPENEM 2 SENSITIVE Sensitive     PIP/TAZO <=4 SENSITIVE Sensitive     CEFEPIME 2 SENSITIVE Sensitive     * ABUNDANT PSEUDOMONAS AERUGINOSA     Labs: Basic Metabolic Panel: Recent Labs  Lab 09/04/20 1116 09/05/20 0745 09/06/20 0554 09/07/20 0323 09/08/20 0316  NA 137 135  --   --   --   K 4.2 4.2  --   --   --   CL 103 102  --   --   --   CO2 25 26  --   --   --   GLUCOSE 98 147*  --   --   --   BUN 15 16  --   --   --   CREATININE 1.16 1.15 1.19 1.15 1.38*  CALCIUM 9.4 9.1  --   --   --    Liver Function Tests: Recent Labs  Lab 09/04/20 1116 09/05/20 0745  AST 33 22  ALT 36 30  ALKPHOS 78 67  BILITOT 1.3* 1.1  PROT 8.5* 7.2  ALBUMIN 3.5 3.1*   CBC: Recent Labs  Lab 09/04/20 1244 09/05/20 0331  WBC 12.4* 9.6  HGB 12.9* 12.0*  HCT 41.4 38.1*  MCV 81.3 81.1  PLT 365 281   CBG: Recent Labs  Lab 09/07/20 1158 09/07/20 1645 09/07/20 2125 09/08/20 0746 09/08/20 1152  GLUCAP 119* 146* 146* 154* 115*    Principal Problem:   Osteomyelitis (HCC) Active Problems:   Type II diabetes mellitus with neurological manifestations (HCC)   Hyperlipidemia associated with type 2 diabetes mellitus ([place])   Hypertension associated with diabetes ([place])   Morbid obesity ([place])   Diabetic foot  infection ([place])   CKD (chronic kidney disease), stage II   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

## 2020-09-08 NOTE — Plan of Care (Signed)
Patient dc'd, all care plans met  

## 2020-09-08 NOTE — Progress Notes (Signed)
Peripherally Inserted Central Catheter Placement  The IV Nurse has discussed with the patient and/or persons authorized to consent for the patient, the purpose of this procedure and the potential benefits and risks involved with this procedure.  The benefits include less needle sticks, lab draws from the catheter, and the patient may be discharged home with the catheter. Risks include, but not limited to, infection, bleeding, blood clot (thrombus formation), and puncture of an artery; nerve damage and irregular heartbeat and possibility to perform a PICC exchange if needed/ordered by physician.  Alternatives to this procedure were also discussed.  Bard Power PICC patient education guide, fact sheet on infection prevention and patient information card has been provided to patient /or left at bedside.    PICC Placement Documentation  PICC Single Lumen [id number] Right Cephalic 38 cm 0 cm (Active)  Indication for Insertion or Continuance of Line Home intravenous therapies (PICC only) 09/08/20 1232  Exposed Catheter (cm) 0 cm 09/08/20 1232  Site Assessment Dry;Clean;Intact 09/08/20 1232  Line Status Flushed;Saline locked;Blood return noted 09/08/20 1232  Dressing Type Transparent 09/08/20 1232  Dressing Status Clean;Dry;Intact 09/08/20 1232  Antimicrobial disc in place? Yes 09/08/20 1232  Safety Lock Not Applicable [id number]  Line Care Connections checked and tightened 09/08/20 1232  Line Adjustment (NICU/IV Team Only) No 09/08/20 1232  Dressing Intervention New dressing 09/08/20 1232  Dressing Change Due 09/15/20 09/08/20 [place]       Rolena Infante 09/08/2020, 12:34 PM

## 2020-09-08 NOTE — Plan of Care (Signed)
  Problem: Education: Goal: Knowledge of General Education information will improve Description: Including pain rating scale, medication(s)/side effects and non-pharmacologic comfort measures Outcome: Progressing   Problem: Activity: Goal: Risk for activity intolerance will decrease Outcome: Progressing   Problem: Pain Managment: Goal: General experience of comfort will improve Outcome: Progressing   

## 2020-09-10 ENCOUNTER — Telehealth: Payer: Self-pay

## 2020-09-10 NOTE — Telephone Encounter (Signed)
Very reasonable- we are available as needed

## 2020-09-10 NOTE — Telephone Encounter (Cosign Needed)
Transition Care Management Follow-up Telephone Call Date of discharge and from where: [facility] 09/08/20 How have you been since you were released from the hospital? Doing great Any questions or concerns? No  Items Reviewed: Did the pt receive and understand the discharge instructions provided? Yes  Medications obtained and verified? Yes  Other? No  Any new allergies since your discharge? No  Dietary orders reviewed? Yes Do you have support at home? Yes   Home Care and Equipment/Supplies: Were home health services ordered? not applicable If so, what is the name of the agency?   Has the agency set up a time to come to the patient's home? not applicable Were any new equipment or medical supplies ordered?  No What is the name of the medical supply agency?  Were you able to get the supplies/equipment? not applicable Do you have any questions related to the use of the equipment or supplies? No  Functional Questionnaire: (I = Independent and D = Dependent) ADLs: I  Bathing/Dressing- I  Meal Prep- I  Eating- I  Maintaining continence- I  Transferring/Ambulation- I  Managing Meds- I  Follow up appointments reviewed:  PCP Hospital f/u appt confirmed? No  . [facility] f/u appt confirmed? Yes  Scheduled to see Dr March Rummage  on 09/11/20 @ 9:15. Are transportation arrangements needed? No  If their condition worsens, is the pt aware to call PCP or go to the Emergency Dept.? Yes Was the patient provided with contact information for the PCP's office or ED? Yes Was to pt encouraged to call back with questions or concerns? Yes

## 2020-09-11 ENCOUNTER — Ambulatory Visit (INDEPENDENT_AMBULATORY_CARE_PROVIDER_SITE_OTHER): Payer: BLUE CROSS/BLUE SHIELD | Admitting: Podiatry

## 2020-09-11 ENCOUNTER — Ambulatory Visit: Payer: BLUE CROSS/BLUE SHIELD

## 2020-09-11 ENCOUNTER — Other Ambulatory Visit: Payer: Self-pay

## 2020-09-11 DIAGNOSIS — L97514 Non-pressure chronic ulcer of other part of right foot with necrosis of bone: Secondary | ICD-10-CM

## 2020-09-11 DIAGNOSIS — M79671 Pain in right foot: Secondary | ICD-10-CM

## 2020-09-11 DIAGNOSIS — Z9889 Other specified postprocedural states: Secondary | ICD-10-CM

## 2020-09-11 DIAGNOSIS — E08621 Diabetes mellitus due to underlying condition with foot ulcer: Secondary | ICD-10-CM

## 2020-09-11 DIAGNOSIS — L97513 Non-pressure chronic ulcer of other part of right foot with necrosis of muscle: Secondary | ICD-10-CM

## 2020-09-11 NOTE — Progress Notes (Signed)
  Subjective:  Patient ID: [name], male    DOB: Jan 20, 1971,  MRN: [id number]  Chief Complaint  Patient presents with   Routine Post Op    PT stated that he is doing well he has no concerns at this time      Date of Surgery: 09/03/20 Procedures:             1) Right foot distal phalanx great toe bone biopsy             2) Debridement of wound right foot - to bone             3) Right 3rd toe amputation             4) Insertion of abx beads  50 y.o. male presents with the above complaint. History confirmed with patient. Doing well, is administering his abx through PICC as directed. Denies complaints with this other than difficulty sleeping from the adjustment. Pain overall controlled to the foot.  Objective:  Physical Exam: tenderness at the surgical site, local edema noted, and calf supple, nontender. Incision: all wounds right foot healing well, 1st toe wound base with thin epithelial tissue. Biopsy site appears mostly healed. 3rd toe amputation site well coapted with intact suture and staple material. Plantar ulcer with mild maceration, retention sutures in place. No warmth/eryhema/fluctuance at any of these areas.  Assessment:   1. Status post surgery   2. Diabetic ulcer of toe of right foot associated with diabetes mellitus due to underlying condition, with necrosis of muscle ([place])   3. Pain in right foot     Plan:  Patient was evaluated and treated and all questions answered.  Post-operative State -Continue abx through PICC -Dressed with dry dressing today. Can change 1-2x weekly based upon drainage -Continue WBAT in boot -Will keep patient OOW to promote healing. Discussed importance of rest and elevation -XRs needed at follow-up: none   No follow-ups on file.

## 2020-09-18 ENCOUNTER — Other Ambulatory Visit: Payer: Self-pay

## 2020-09-18 ENCOUNTER — Ambulatory Visit (INDEPENDENT_AMBULATORY_CARE_PROVIDER_SITE_OTHER): Payer: BLUE CROSS/BLUE SHIELD

## 2020-09-18 ENCOUNTER — Ambulatory Visit (INDEPENDENT_AMBULATORY_CARE_PROVIDER_SITE_OTHER): Payer: BLUE CROSS/BLUE SHIELD | Admitting: Podiatry

## 2020-09-18 DIAGNOSIS — T81509A Unspecified complication of foreign body accidentally left in body following unspecified procedure, initial encounter: Secondary | ICD-10-CM | POA: Diagnosis not present

## 2020-09-18 DIAGNOSIS — Z9889 Other specified postprocedural states: Secondary | ICD-10-CM

## 2020-09-18 NOTE — Progress Notes (Signed)
  Subjective:  Patient ID: [name], male    DOB: 10-26-1970,  MRN: [id number]  Chief Complaint  Patient presents with   Routine Post Op    POV #2 , Patient states his foot has been doing fine since the last visit , he hasn't had any pain , he states that the cam boot is slowing him down.      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 male presents with the above complaint. History confirmed with patient. Denies new pedal issues. Denies pain in his foot.  Objective:  Physical Exam: tenderness at the surgical site, local edema noted, and calf supple, nontender. Incision: all wounds right foot healing well, 1st toe wound base with mild HPK. Biopsy site appears healed. 3rd toe amputation site well coapted with intact suture and staple material. Plantar ulcer with mild maceration, retention sutures in place. No warmth/eryhema/fluctuance at any of these areas.  Assessment:   1. Post-operative state    Plan:  Patient was evaluated and treated and all questions answered.  Post-operative State -Continue abx through PICC -Re-dressed with dry dressing today. Can change 1-2x weekly based upon drainage -Continue WBAT in boot -Sutures removed today. Ok to shower, staples left intact for 2 more weeks -Will keep patient OOW to promote healing. Discussed importance of rest and elevation -XRs needed at follow-up: none   Return in about 2 weeks (around 10/02/2020) for Post-Op (No XRs), Wound Care.

## 2020-09-19 ENCOUNTER — Ambulatory Visit: Payer: BLUE CROSS/BLUE SHIELD | Admitting: Internal Medicine

## 2020-09-19 ENCOUNTER — Other Ambulatory Visit: Payer: Self-pay

## 2020-09-19 VITALS — BP 137/87 | HR 90 | Temp 98.2°F | Resp 16 | Ht 72.0 in | Wt 311.0 lb

## 2020-09-19 DIAGNOSIS — E11621 Type 2 diabetes mellitus with foot ulcer: Secondary | ICD-10-CM | POA: Diagnosis not present

## 2020-09-19 DIAGNOSIS — L97509 Non-pressure chronic ulcer of other part of unspecified foot with unspecified severity: Secondary | ICD-10-CM | POA: Diagnosis not present

## 2020-09-19 DIAGNOSIS — E1169 Type 2 diabetes mellitus with other specified complication: Secondary | ICD-10-CM

## 2020-09-19 DIAGNOSIS — M869 Osteomyelitis, unspecified: Secondary | ICD-10-CM | POA: Diagnosis not present

## 2020-09-19 MED ORDER — CIPROFLOXACIN HCL 500 MG PO TABS: 500.0000 mg | ORAL_TABLET | Freq: Two times a day (BID) | ORAL | 0 refills | Status: AC

## 2020-09-19 MED ORDER — AMOXICILLIN-POT CLAVULANATE 875-125 MG PO TABS: 1.0000 | ORAL_TABLET | Freq: Two times a day (BID) | ORAL | 0 refills | Status: DC

## 2020-09-19 NOTE — Patient Instructions (Addendum)
Please see me again in 2 weeks to see if we can switch you to pills   Continue pip-tazo for now  Follow up with your foot doctor   Please send me picture of your foot around 10 days. If it looks good i'll let you know to go ahead and start oral antibiotics on 9/14 and remove picc  Please pickup ciprofloxacin and amoxicillin-clavulonate today (2 weeks total) -- these are to be started on 9/14 if everything looks good

## 2020-09-19 NOTE — Progress Notes (Signed)
[facility]  Patient Active Problem List   Diagnosis Date Noted   Ulcer of right foot with necrosis of bone ([place])    Osteomyelitis ([place]) 08/31/2020   Occlusion of peripherally inserted central catheter (PICC) line ([place]) 01/10/2020   Diabetic foot ulcer ([place]) 11/18/2019   Charcot's joint of foot, right 11/18/2019   Pyogenic inflammation of bone ([place]) 11/09/2019   Osteomyelitis of ankle or foot, acute, right ([place]) 11/09/2019   Diabetic retinopathy ([place]) 01/25/2018   Status post split thickness skin graft 12/08/2016   B12 deficiency 11/17/2016   Iron deficiency anemia 11/17/2016   CKD (chronic kidney disease), stage II 11/17/2016   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   MRN: [id number]  Chief Complaint  Patient presents with   Hospitalization Follow-up    HPI:  [name] is a 50 y.o. male here for hospital f/u diabetic foot osteomyelitis  He was admitted 8/12 for worsening right foot ulcer. Mri confirmed multifocal om. He underwent I&D and abx bead placement on 8/15. Operative cx with PsA (S cipro), bacteroides, mssa (R tetra), corynebacterium not striatum. He was placed on vanc/cefepime 3 days prior to I&D and discharged on pip-tazo. The plan was to do around 2 weeks iv and see if can transition to PO abx to finish 6 week course of treatment  He also had received outpatient doxy  prior to that admission  8/31 id clinic f/u Doing well Sutures removed, staples still in. To see podiatry in 2 weeks again No f/c No n/v/diarrhea No wound dehiscence. Minimal clear discharge at times No swelling No pain Every now and then abx bead falls all (they are supposed to dissolve)   No Known Allergies    Outpatient Medications Prior to Visit  Medication Sig Dispense Refill   ascorbic acid (VITAMIN C) 500 MG tablet Take 500 mg by mouth daily.     aspirin EC 81 MG tablet Take 81 mg by mouth daily.     atorvastatin (LIPITOR) 40 MG tablet TAKE 1 AND 1/2 TABLETS BY MOUTH EVERY MORNING (Patient taking differently: Take 60 mg by mouth daily.) 135 tablet 0   Cyanocobalamin (VITAMIN B-12) 5000 MCG SUBL Place 5,000 mcg under the tongue daily.     fenofibrate (TRICOR) 48 MG tablet TAKE 1 TABLET BY MOUTH EVERY DAY (Patient taking differently: Take 48 mg by mouth daily.) 90 tablet 1   glucose blood (ACCU-CHEK GUIDE) test strip Dx E11.49 - Use to check blood glucose three times daily or as directed. 300 each 1   hydrochlorothiazide (MICROZIDE) 12.5 MG capsule TAKE 1 CAPSULE BY MOUTH EVERY DAY IN THE MORNING (Patient taking differently: Take 12.5 mg by mouth daily.) 90 capsule 1   insulin aspart (NOVOLOG) 100 UNIT/ML injection INJECT 15 TO 25 UNITS UNDER THE SKIN TWICE DAILY (Patient taking differently: Inject 15-25 Units into the skin 2 (two) times daily.  Per sliding scale) 50 mL 3   Insulin Degludec (TRESIBA) 100 UNIT/ML SOLN Inject 78-86 Units into the skin at bedtime. Per sliding scale     Insulin Syringe-Needle U-100 (BD INSULIN SYRINGE U/F) 31G X 5/16" 0.5 ML MISC Use to inject insulin 4 times daily 100 each 4   Insulin Syringe-Needle U-100 (BD INSULIN SYRINGE U/F) 31G X 5/16" 1 ML MISC Use to inject tresiba/long acting insulin once daily 100 each 3   Insulin Syringe-Needle U-100 (BD VEO INSULIN SYR U/F 1/2UNIT) 31G X 15/64" 0.3 ML MISC USE TO INJECT INSULIN FOUR TIMES DAILY AS DIRECTED  300 each 2   JANUVIA 50 MG tablet TAKE 1 TABLET BY MOUTH EVERY DAY IN THE MORNING (Patient taking differently: Take 50 mg by mouth daily.) 90 tablet 1   Lancets (ONETOUCH ULTRASOFT) lancets Use to test blood sugars daily. Dx: E11.9 100 each 12   Lancets MISC USED TO CHECK BLOOD GLUCOSE 3-4 TIMES DAILY 200 each 3   Multiple Vitamin (MULTIVITAMIN WITH MINERALS) TABS tablet Take 1 tablet by mouth 2 (two) times daily.     Omega-3 Fatty Acids (FISH OIL) 1360 MG CAPS Take 1,360 mg by mouth daily.      piperacillin-tazobactam (ZOSYN) IVPB Inject 13.5 g into the vein daily. As a continuous infusion. Indication: osteomyelitis  First Dose: No Last Day of Therapy:  10/15/2020 Labs - Once weekly:  CBC/D and BMP, Labs - Every other week:  ESR and CRP Method of administration: Elastomeric (Continuous infusion) Method of administration may be changed at the discretion of home infusion pharmacist based upon assessment of the patient and/or caregiver's ability to self-administer the medication ordered. 38 Units 0   pyridoxine (B-6) 100 MG tablet Take 100 mg by mouth 2 (two) times daily.     ramipril (ALTACE) 5 MG capsule TAKE 1 CAPSULE BY MOUTH EVERY DAY IN THE MORNING (Patient taking differently: Take 5 mg by mouth daily.) 90 capsule 2   No facility-administered medications prior to visit.     Social History   Socioeconomic History   Marital status: Married    Spouse name: Not on file   Number of children: Not on file   Years of education: Not on file   Highest education level: Not on file  Occupational History   Not on file  Tobacco Use   Smoking status: Never   Smokeless tobacco: Never  Vaping Use   Vaping Use: Never used  Substance and Sexual Activity   Alcohol use: No   Drug use: Never   Sexual activity: Yes  Other Topics Concern   Not on file  Social History Narrative   Married. 63 year old daughter 10/2016.       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       Hobbies: drawing, Community education officer, tinkering   Social Determinants of Health   Financial Resource Strain: Not on file  Food Insecurity: Not on file  Transportation Needs: Not on file  Physical Activity: Not on file  Stress: Not on file  Social Connections: Not on file  Intimate Partner Violence: Not on file      Review of Systems     Objective:    Resp 16   Ht 6' (1.829 m)   Wt (!) 311 lb (141.1 kg)   BMI  42.18 kg/m  Nursing note and vital signs reviewed.  Physical Exam    General/constitutional: no distress, pleasant HEENT: Normocephalic, PER, Conj Clear, EOMI, Oropharynx clear Neck supple CV: rrr no mrg Lungs: clear to auscultation, normal respiratory effort Abd: Soft, Nontender Ext: no edema Skin: No Rash Neuro: nonfocal MSK: no peripheral joint swelling/tenderness/warmth; back spines nontender   Central line presence: right ue picc site no erythema/tenderness      Labs: 8/30 [facility] Cbc 8/12/300; cr 2.02   Last metabolic panel Lab Results  Component Value Date   GLUCOSE 147 (H) 09/05/2020   NA 135 09/05/2020   K 4.2 09/05/2020   CL 102 09/05/2020   CO2 26 09/05/2020   BUN 16 09/05/2020   CREATININE 1.38 (H) 09/08/2020   GFRNONAA >60 09/08/2020   GFRAA 68 12/09/2019   CALCIUM 9.1 09/05/2020   PROT 7.2 09/05/2020   ALBUMIN 3.1 (L) 09/05/2020   BILITOT 1.1 09/05/2020   ALKPHOS 67 09/05/2020   AST 22 09/05/2020   ALT 30 09/05/2020   ANIONGAP 7 09/05/2020     Crp: 8/30   5    (<10)  Micro:  Serology:  Imaging: 8/13 right foot mri 1. Acute osteomyelitis involving the proximal and middle phalanx of the right third toe. Reactive osteitis involving the distal phalanx of the third toe. 2. Acute osteomyelitis of the cuboid with erosive changes along its plantar  margin. 3. Acute osteomyelitis involving the residual fourth metatarsal base. 4. Findings suggestive of early acute osteomyelitis involving the distal phalanx of the great toe. 5. Charcot arthropathy within the midfoot. 6. Broad soft tissue wound/ulceration along the lateral aspect of the forefoot. Ill-defined fluid along the lateral forefoot without well-defined or rim enhancing fluid collection  8/17 abi Right: Resting right ankle-brachial index is within normal range. No  evidence of significant right lower extremity arterial disease.   Left: Resting left ankle-brachial index is within normal range. No  evidence of significant left lower extremity arterial disease. The left  toe-brachial index is normal.   Assessment & Plan:   Problem List Items Addressed This Visit   None Visit Diagnoses     Diabetic foot ulcer with osteomyelitis ([place])    -  Primary         No orders of the defined types were placed in this encounter.    Diagnosis: Right foot diabetic infection/om     Abx: 8/12-c vanc 8/12-c cefepime                                                              Assessment: 50 yo male uncontrolled dm2, htn/hlp, pituitary macroadenoma, pvd, s/p right 5th and 4th partial ray 4 years prior to admission, admitted by podiatry referal for worsening right foot ulcer/om. Mri confirmed multifocal OM of the cuboid, 4th metatarsal, distal phalanx hallux, phalanges of the 3rd toe   It appears patient was taking doxycycline prior to admission   Patient is s/p I&D with abx bead placement on 8/15. No blood cx obtained this admission. Cx reviewed --> pseudomonas (S cipro), bacteroides, mssa (R tetra), corynebacterium (not striatum)   Given pseudomonas, would like to try IV abx then reevaluate in 2-3 weeks for transitioning to oral abx   Abi normal  8/31 id clinic assessment Doing well, crp  normalized, on piptazo No abx side effect Likely no coverage for corynebacterium on  piptazo but doing very well clinically.   Will continue piptazo for another 2 weeks until his suture is removed then can transition to cipro and augmentin for 2 more weeks to finish off 6 week course     Plan: At around 10 days patient to send me picture of his foot; if labs and picture look well will transition to oral cipro/augmentin at that time and remove picc He is to pick up oral abx today For now Continue piptazo Continue weekly labs cbc, cmp, crp    Follow-up: Return in about 4 weeks (around 10/17/2020).      [name], [place] [facility] [phone]  pager   [phone] cell 09/19/2020, 2:30 PM

## 2020-09-24 ENCOUNTER — Other Ambulatory Visit: Payer: Self-pay | Admitting: Family Medicine

## 2020-09-27 ENCOUNTER — Other Ambulatory Visit: Payer: Self-pay

## 2020-09-27 ENCOUNTER — Encounter: Payer: BLUE CROSS/BLUE SHIELD | Admitting: Internal Medicine

## 2020-09-27 DIAGNOSIS — E11628 Type 2 diabetes mellitus with other skin complications: Secondary | ICD-10-CM

## 2020-09-27 MED ORDER — AMOXICILLIN-POT CLAVULANATE 875-125 MG PO TABS: 1.0000 | ORAL_TABLET | Freq: Two times a day (BID) | ORAL | 0 refills | Status: AC

## 2020-10-02 ENCOUNTER — Telehealth: Payer: Self-pay | Admitting: Pharmacist

## 2020-10-02 ENCOUNTER — Ambulatory Visit (INDEPENDENT_AMBULATORY_CARE_PROVIDER_SITE_OTHER): Payer: BLUE CROSS/BLUE SHIELD | Admitting: Podiatry

## 2020-10-02 ENCOUNTER — Other Ambulatory Visit: Payer: Self-pay

## 2020-10-02 DIAGNOSIS — Z9889 Other specified postprocedural states: Secondary | ICD-10-CM

## 2020-10-02 NOTE — Telephone Encounter (Signed)
Spoke with Jeani Hawking at [facility] and gave verbal order to pull patient's PICC line today per Dr. Diona Fanti verbalized understanding.

## 2020-10-02 NOTE — Telephone Encounter (Signed)
I called [facility] to let them know to pull PICC. Thanks!

## 2020-10-05 ENCOUNTER — Other Ambulatory Visit: Payer: Self-pay

## 2020-10-05 ENCOUNTER — Telehealth: Payer: Self-pay

## 2020-10-05 ENCOUNTER — Other Ambulatory Visit: Payer: Self-pay | Admitting: Family Medicine

## 2020-10-05 NOTE — Telephone Encounter (Signed)
LAST APPOINTMENT DATE:  06/25/20  NEXT APPOINTMENT DATE: 10/31/20  MEDICATION:atorvastatin (LIPITOR) 40 MG tablet  PHARMACY:[id number] - [address]   **Pt stated that he needs a 90 day supply in order for insurance to cover. Please Advise

## 2020-10-05 NOTE — Telephone Encounter (Signed)
Sent to pharmacy 

## 2020-10-08 NOTE — Progress Notes (Signed)
  Subjective:  Patient ID: [name], male    DOB: June 29, 1970,  MRN: [id number]  Chief Complaint  Patient presents with   Diabetic Ulcer     Post-Op (No XRs), Wound Care, right foot     Date of Surgery: 09/03/20 Procedures:             1) Right foot distal phalanx great toe bone biopsy             2) Debridement of wound right foot - to bone             3) Right 3rd toe amputation             4) Insertion of abx beads  50 y.o. male presents with the above complaint. History confirmed with patient. Denies new pedal issues. Denies pain in his foot.  Objective:  Physical Exam: tenderness at the surgical site, local edema noted, and calf supple, nontender. Incision: all wounds right foot healing well, 1st toe wound base with mild HPK. Biopsy site appears healed.  Third toe potation site well-healed plantar ulcer with mild hyperkeratosis no maceration. No warmth/eryhema/fluctuance at any of these areas.  Assessment:   1. Post-operative state     Plan:  Patient was evaluated and treated and all questions answered.  Post-operative State -Wound continues to do well.  There is minimal drainage from the plantar aspect of the foot.  Continue weightbearing in boot we will plan for driving the next few weeks.  We will have to discuss shoe gear limitations hemostat at work to prevent further issues with his foot.   No follow-ups on file.

## 2020-10-10 ENCOUNTER — Other Ambulatory Visit: Payer: Self-pay

## 2020-10-10 ENCOUNTER — Telehealth: Payer: Self-pay

## 2020-10-10 MED ORDER — ATORVASTATIN CALCIUM 40 MG PO TABS: ORAL_TABLET | ORAL | 0 refills | Status: DC

## 2020-10-10 NOTE — Telephone Encounter (Signed)
.   Encourage patient to contact the pharmacy for refills or they can request refills through [facility]:  Please schedule appointment if longer than 1 year  NEXT APPOINTMENT DATE:  MEDICATION:atorvastatin (LIPITOR) 40 MG tablet  Is the patient out of medication?   [id number] [place] [place]  --------------------------------Patient states insurance will only cover a 90 day supply ------------------------------------------

## 2020-10-10 NOTE — Telephone Encounter (Signed)
2 month supply sent 

## 2020-10-11 ENCOUNTER — Other Ambulatory Visit: Payer: Self-pay

## 2020-10-11 MED ORDER — ATORVASTATIN CALCIUM 40 MG PO TABS: ORAL_TABLET | ORAL | 0 refills | Status: DC

## 2020-10-16 ENCOUNTER — Other Ambulatory Visit: Payer: Self-pay

## 2020-10-16 ENCOUNTER — Ambulatory Visit (INDEPENDENT_AMBULATORY_CARE_PROVIDER_SITE_OTHER): Payer: BLUE CROSS/BLUE SHIELD | Admitting: Internal Medicine

## 2020-10-16 ENCOUNTER — Encounter: Payer: Self-pay | Admitting: Internal Medicine

## 2020-10-16 VITALS — BP 137/81 | HR 76 | Temp 98.3°F | Wt 316.0 lb

## 2020-10-16 DIAGNOSIS — E11628 Type 2 diabetes mellitus with other skin complications: Secondary | ICD-10-CM | POA: Diagnosis not present

## 2020-10-16 DIAGNOSIS — L089 Local infection of the skin and subcutaneous tissue, unspecified: Secondary | ICD-10-CM | POA: Diagnosis not present

## 2020-10-16 NOTE — Progress Notes (Signed)
[facility]  Patient Active Problem List   Diagnosis Date Noted   Ulcer of right foot with necrosis of bone ([place])    Osteomyelitis ([place]) 08/31/2020   Occlusion of peripherally inserted central catheter (PICC) line ([place]) 01/10/2020   Diabetic foot ulcer ([place]) 11/18/2019   Charcot's joint of foot, right 11/18/2019   Pyogenic inflammation of bone ([place]) 11/09/2019   Osteomyelitis of ankle or foot, acute, right ([place]) 11/09/2019   Diabetic retinopathy ([place]) 01/25/2018   Status post split thickness skin graft 12/08/2016   B12 deficiency 11/17/2016   Iron deficiency anemia 11/17/2016   CKD (chronic kidney disease), stage II 11/17/2016   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   MRN: [id number]  Chief Complaint  Patient presents with   Follow-up    Diabetic foot ulcer with osteomyelitis     HPI:  [name] is a 50 y.o. male here for hospital f/u diabetic foot osteomyelitis  He was admitted 8/12 for worsening right foot ulcer. Mri confirmed multifocal om. He underwent I&D and abx bead placement on 8/15. Operative cx with PsA (S cipro), bacteroides, mssa (R tetra), corynebacterium not striatum. He was placed on vanc/cefepime 3 days prior to I&D and discharged on pip-tazo. The plan was to do around 2 weeks iv and see if can transition to PO abx to finish 6 week course of treatment  He also had  received outpatient doxy prior to that admission  8/31 id clinic f/u Doing well Sutures removed, staples still in. To see podiatry in 2 weeks again No f/c No n/v/diarrhea No wound dehiscence. Minimal clear discharge at times No swelling No pain Every now and then abx bead falls all (they are supposed to dissolve)  9/27 id f/u Patient transitioned to oral abx around 2 weeks ago He is doing well Planned 6 weeks abx from 8/15 till 9/28 The incision distal lateral right foot still not closed completely. There is a callous associated ulcer more proximal and plantar that also has slight opening There is slight serous oozing at around the callous No f/c, pain in foot, swelling, warmth locally, or redness Eating well Will see podiatry a week from now; so far happy with progress  No Known Allergies    Outpatient Medications Prior to Visit  Medication Sig Dispense Refill   ascorbic acid (VITAMIN C) 500 MG tablet Take 500 mg by mouth daily.     aspirin EC 81 MG tablet Take 81 mg by mouth daily.     atorvastatin (LIPITOR) 40 MG tablet TAKE 1 AND 1/2 TABLETS BY MOUTH EVERY MORNING 135 tablet 0   Cyanocobalamin (VITAMIN B-12) 5000 MCG SUBL Place 5,000 mcg under the tongue daily.     fenofibrate (TRICOR) 48 MG tablet TAKE 1 TABLET BY MOUTH EVERY DAY (Patient taking differently: Take 48 mg by mouth daily.) 90 tablet 1   glucose blood (  ACCU-CHEK GUIDE) test strip Dx E11.49 - Use to check blood glucose three times daily or as directed. 300 each 1   hydrochlorothiazide (MICROZIDE) 12.5 MG capsule TAKE 1 CAPSULE BY MOUTH EVERY DAY IN THE MORNING (Patient taking differently: Take 12.5 mg by mouth daily.) 90 capsule 1   insulin aspart (NOVOLOG) 100 UNIT/ML injection INJECT 15 TO 25 UNITS UNDER THE SKIN TWICE DAILY (Patient taking differently: Inject 15-25 Units into the skin 2 (two) times daily. Per sliding scale) 50 mL 3   Insulin Degludec (TRESIBA) 100 UNIT/ML SOLN Inject 78-86 Units into the skin at  bedtime. Per sliding scale     Insulin Syringe-Needle U-100 (BD INSULIN SYRINGE U/F) 31G X 5/16" 0.5 ML MISC Use to inject insulin 4 times daily 100 each 4   Insulin Syringe-Needle U-100 (BD INSULIN SYRINGE U/F) 31G X 5/16" 1 ML MISC Use to inject tresiba/long acting insulin once daily 100 each 3   Insulin Syringe-Needle U-100 (BD VEO INSULIN SYR U/F 1/2UNIT) 31G X 15/64" 0.3 ML MISC USE TO INJECT INSULIN FOUR TIMES DAILY AS DIRECTED 300 each 2   JANUVIA 50 MG tablet TAKE 1 TABLET BY MOUTH EVERY DAY IN THE MORNING (Patient taking differently: Take 50 mg by mouth daily.) 90 tablet 1   Lancets (ONETOUCH ULTRASOFT) lancets Use to test blood sugars daily. Dx: E11.9 100 each 12   Lancets MISC USED TO CHECK BLOOD GLUCOSE 3-4 TIMES DAILY 200 each 3   Multiple Vitamin (MULTIVITAMIN WITH MINERALS) TABS tablet Take 1 tablet by mouth 2 (two) times daily.     Omega-3 Fatty Acids (FISH OIL) 1360 MG CAPS Take 1,360 mg by mouth daily.      pyridoxine (B-6) 100 MG tablet Take 100 mg by mouth 2 (two) times daily.     ramipril (ALTACE) 5 MG capsule TAKE 1 CAPSULE BY MOUTH EVERY DAY IN THE MORNING (Patient taking differently: Take 5 mg by mouth daily.) 90 capsule 2   No facility-administered medications prior to visit.     Social History   Socioeconomic History   Marital status: Married    Spouse name: Not on file   Number of children: Not on file   Years of education: Not on file   Highest education level: Not on file  Occupational History   Not on file  Tobacco Use   Smoking status: Never   Smokeless tobacco: Never  Vaping Use   Vaping Use: Never used  Substance and Sexual Activity   Alcohol use: No   Drug use: Never   Sexual activity: Yes  Other Topics Concern   Not on file  Social History Narrative   Married. 96 year old daughter 10/2016.       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       Hobbies: drawing, archery, tinkering   Social Determinants of Health   Financial Resource Strain: Not on file  Food Insecurity: Not on file  Transportation Needs: Not on file  Physical Activity: Not on file  Stress: Not on file  Social Connections: Not on file  Intimate Partner Violence: Not on file      Review of Systems    Other ros negative   Objective:    BP 137/81   Pulse 76   Temp 98.3 F (36.8 C) (Oral)  Wt (!) 316 lb (143.3 kg)   BMI 42.86 kg/m  Nursing note and vital signs reviewed.  Physical Exam    General/constitutional: no distress, pleasant HEENT: Normocephalic, PER, Conj Clear, EOMI, Oropharynx clear Neck supple CV: rrr no mrg Lungs: clear to auscultation, normal respiratory effort Abd: Soft, Nontender Ext: no edema Skin: No Rash Neuro: nonfocal MSK: see picture below; slight incompletely closed callous ulcer and incision; serous stain at callous. No tenderness/fluctuance/purulence or erythema   Central line presence: previous right ue picc site no induraiton/tenderness          Labs: 8/30 [facility] Cbc 8/12/300; cr 0.93   Last metabolic panel Lab Results  Component Value Date   GLUCOSE 147 (H) 09/05/2020   NA 135 09/05/2020   K 4.2 09/05/2020   CL 102 09/05/2020   CO2 26 09/05/2020   BUN 16 09/05/2020   CREATININE 1.38 (H) 09/08/2020   GFRNONAA >60 09/08/2020   GFRAA 68 12/09/2019   CALCIUM 9.1 09/05/2020   PROT 7.2 09/05/2020   ALBUMIN 3.1 (L) 09/05/2020   BILITOT 1.1 09/05/2020   ALKPHOS 67 09/05/2020   AST 22 09/05/2020   ALT 30 09/05/2020   ANIONGAP 7 09/05/2020     Crp: 8/30   5    (<10)  Micro:  Serology:  Imaging: 8/13 right foot mri 1. Acute osteomyelitis involving the proximal and middle phalanx of the right third toe. Reactive osteitis involving the distal phalanx of the third toe. 2. Acute  osteomyelitis of the cuboid with erosive changes along its plantar margin. 3. Acute osteomyelitis involving the residual fourth metatarsal base. 4. Findings suggestive of early acute osteomyelitis involving the distal phalanx of the great toe. 5. Charcot arthropathy within the midfoot. 6. Broad soft tissue wound/ulceration along the lateral aspect of the forefoot. Ill-defined fluid along the lateral forefoot without well-defined or rim enhancing fluid collection  8/17 abi Right: Resting right ankle-brachial index is within normal range. No  evidence of significant right lower extremity arterial disease.   Left: Resting left ankle-brachial index is within normal range. No  evidence of significant left lower extremity arterial disease. The left  toe-brachial index is normal.   Assessment & Plan:   Problem List Items Addressed This Visit   None    No orders of the defined types were placed in this encounter.    Diagnosis: Right foot diabetic infection/om     Abx: 9/14-9/28 cipro/agumentin 8/12-9/14 vanc 8/12-9/14 cefepime                                                              Assessment: 50 yo male uncontrolled dm2, htn/hlp, pituitary macroadenoma, pvd, s/p right 5th and 4th partial ray 4 years prior to admission, admitted by podiatry referal for worsening right foot ulcer/om. Mri confirmed multifocal OM of the cuboid, 4th metatarsal, distal phalanx hallux, phalanges of the 3rd toe   It appears patient was taking doxycycline prior to admission   Patient is s/p I&D with abx bead placement on 8/15. No blood cx obtained this admission. Cx reviewed --> pseudomonas (S cipro), bacteroides, mssa (R tetra), corynebacterium (not striatum)   Given pseudomonas, would like to try IV abx then reevaluate in 2-3 weeks for transitioning to oral abx   Abi normal  8/31 id clinic  assessment Doing well, crp normalized, on piptazo No abx side effect Likely no coverage for  corynebacterium on piptazo but doing very well clinically.   Will continue piptazo for another 2 weeks until his suture is removed then can transition to cipro and augmentin for 2 more weeks to finish off 6 week course   9/27 id assessment Doing well on oral cipro/augmentin the psat 2 weeks Staples out of incision which still has some closing to do; the callous associated ulcer closing but still open  Has had 6 weeks likely can stop abx tomorrow Crp/cbc/cmp for baseline. If markedly high can consider 2 more weeks   Plan: Crp/cbc/cmp Stop abx tomororw 9/28 If crp signficantly high or recurrent increased redness/swelling/purulence within the next 2 weeks can also consider restart of 2-4 more weeks cipro/aumentin F/u podiatry    Follow-up: Return if symptoms worsen or fail to improve.   I have spent a total of 20 minutes of face-to-face and non-face-to-face time, excluding clinical staff time, preparing to see patient, ordering tests and/or medications, and provide counseling the patient    [name], [facility] for [facility] [phone]  pager   [phone] cell 10/16/2020, 11:02 AM

## 2020-10-16 NOTE — Patient Instructions (Addendum)
Labs today   If wound continues to heal well stay off abx starting tomorrow  See your podiatrist as previously scheduled  No need to scheduled any f/u with me now

## 2020-10-17 LAB — COMPLETE METABOLIC PANEL WITH GFR
AG Ratio: 1.1 (calc) (ref 1.0–2.5)
ALT: 56 U/L — ABNORMAL HIGH (ref 9–46)
AST: 35 U/L (ref 10–35)
Albumin: 4.5 g/dL (ref 3.6–5.1)
Alkaline phosphatase (APISO): 75 U/L (ref 35–144)
BUN/Creatinine Ratio: 11 (calc) (ref 6–22)
BUN: 20 mg/dL (ref 7–25)
CO2: 25 mmol/L (ref 20–32)
Calcium: 10.4 mg/dL — ABNORMAL HIGH (ref 8.6–10.3)
Chloride: 102 mmol/L (ref 98–110)
Creat: 1.79 mg/dL — ABNORMAL HIGH (ref 0.70–1.30)
Globulin: 4.1 g/dL (calc) — ABNORMAL HIGH (ref 1.9–3.7)
Glucose, Bld: 107 mg/dL — ABNORMAL HIGH (ref 65–99)
Potassium: 4.1 mmol/L (ref 3.5–5.3)
Sodium: 138 mmol/L (ref 135–146)
Total Bilirubin: 0.6 mg/dL (ref 0.2–1.2)
Total Protein: 8.6 g/dL — ABNORMAL HIGH (ref 6.1–8.1)
eGFR: 46 mL/min/{1.73_m2} — ABNORMAL LOW (ref >=60)

## 2020-10-17 LAB — C-REACTIVE PROTEIN: CRP: 8.2 mg/L — ABNORMAL HIGH (ref <8.0)

## 2020-10-17 LAB — CBC
HCT: 42.5 % (ref 38.5–50.0)
Hemoglobin: 13.6 g/dL (ref 13.2–17.1)
MCH: 25.6 pg — ABNORMAL LOW (ref 27.0–33.0)
MCHC: 32 g/dL (ref 32.0–36.0)
MCV: 80 fL (ref 80.0–100.0)
MPV: 9.2 fL (ref 7.5–12.5)
Platelets: 296 10*3/uL (ref 140–400)
RBC: 5.31 10*6/uL (ref 4.20–5.80)
RDW: 13.1 % (ref 11.0–15.0)
WBC: 7.6 10*3/uL (ref 3.8–10.8)

## 2020-10-18 NOTE — Telephone Encounter (Signed)
error 

## 2020-10-18 NOTE — Telephone Encounter (Signed)
Error

## 2020-10-23 ENCOUNTER — Other Ambulatory Visit: Payer: Self-pay

## 2020-10-23 ENCOUNTER — Ambulatory Visit (INDEPENDENT_AMBULATORY_CARE_PROVIDER_SITE_OTHER): Payer: BLUE CROSS/BLUE SHIELD | Admitting: Podiatry

## 2020-10-23 DIAGNOSIS — Z9889 Other specified postprocedural states: Secondary | ICD-10-CM

## 2020-10-24 NOTE — Progress Notes (Signed)
Phone [phone] In person visit   Subjective:   [name] is a 50 y.o. year old very pleasant male patient who presents for/with See problem oriented charting Chief Complaint  Patient presents with   Diabetes   Hypertension   Hyperlipidemia   This visit occurred during the SARS-CoV-2 public health emergency.  Safety protocols were in place, including screening questions prior to the visit, additional usage of staff PPE, and extensive cleaning of exam room while observing appropriate contact time as indicated for disinfecting solutions.   Past Medical History-  Patient Active Problem List   Diagnosis Date Noted   Morbid obesity ([place]) 03/08/2015    Priority: 1.   Type II diabetes mellitus with neurological manifestations ([place]) 03/07/2015    Priority: 1.   Diabetic retinopathy ([place]) 01/25/2018    Priority: 2.   CKD (chronic kidney disease), stage II 11/17/2016    Priority: 2.   Diabetic neuropathy ([place]) 11/17/2016    Priority: 2.   Elevated LFTs     Priority: 2.   Hyperlipidemia associated with type 2 diabetes mellitus ([place]) 03/08/2015    Priority: 2.   Hypertension associated with diabetes ([place]) 03/08/2015    Priority: 2.   Status post split thickness skin graft 12/08/2016    Priority: 3.   B12 deficiency 11/17/2016    Priority: 3.   Iron deficiency anemia 11/17/2016    Priority: 3.   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   Ulcer of right foot with necrosis of bone ([place])    Osteomyelitis ([place]) 08/31/2020   Occlusion of peripherally inserted central catheter (PICC) line ([place]) 01/10/2020   Diabetic foot ulcer ([place]) 11/18/2019   Charcot's joint of foot, right 11/18/2019   Pyogenic inflammation of bone ([place]) 11/09/2019   Osteomyelitis of ankle or foot, acute, right ([place]) 11/09/2019   Diabetic foot  infection ([place]) 07/29/2016    Medications- reviewed and updated Current Outpatient Medications  Medication Sig Dispense Refill   ascorbic acid (VITAMIN C) 500 MG tablet Take 500 mg by mouth daily.     aspirin EC 81 MG tablet Take 81 mg by mouth daily.     Cyanocobalamin (VITAMIN B-12) 5000 MCG SUBL Place 5,000 mcg under the tongue daily.     glucose blood (ACCU-CHEK GUIDE) test strip Dx E11.49 - Use to check blood glucose three times daily or as directed. 300 each 1   insulin aspart (NOVOLOG) 100 UNIT/ML injection INJECT 15 TO 25 UNITS UNDER THE SKIN TWICE DAILY (Patient taking differently: Inject 15-25 Units into the skin 2 (two) times daily. Per sliding scale) 50 mL 3   Insulin Degludec (TRESIBA) 100 UNIT/ML SOLN Inject 78-86 Units into the skin at bedtime. Per sliding scale     Insulin Syringe-Needle U-100 (BD INSULIN SYRINGE U/F) 31G X 5/16" 0.5 ML MISC Use to inject insulin 4 times daily 100 each 4   Insulin Syringe-Needle U-100 (BD INSULIN SYRINGE U/F) 31G X 5/16" 1 ML MISC Use to inject tresiba/long acting insulin once daily 100 each 3   Insulin Syringe-Needle U-100 (BD VEO INSULIN SYR U/F 1/2UNIT) 31G X 15/64" 0.3 ML MISC USE TO INJECT INSULIN FOUR TIMES DAILY AS DIRECTED 300 each 2   Lancets (ONETOUCH ULTRASOFT) lancets Use to test blood sugars daily. Dx: E11.9 100 each 12  Lancets MISC USED TO CHECK BLOOD GLUCOSE 3-4 TIMES DAILY 200 each 3   Multiple Vitamin (MULTIVITAMIN WITH MINERALS) TABS tablet Take 1 tablet by mouth 2 (two) times daily.     Omega-3 Fatty Acids (FISH OIL) 1360 MG CAPS Take 1,360 mg by mouth daily.      pyridoxine (B-6) 100 MG tablet Take 100 mg by mouth 2 (two) times daily.     atorvastatin (LIPITOR) 40 MG tablet TAKE 1 AND 1/2 TABLETS BY MOUTH EVERY MORNING 150 tablet 3   fenofibrate (TRICOR) 48 MG tablet TAKE 1 TABLET BY MOUTH EVERY DAY 100 tablet 3   hydrochlorothiazide (MICROZIDE) 12.5 MG capsule TAKE 1 CAPSULE BY MOUTH EVERY DAY IN THE MORNING 100 capsule 3    ramipril (ALTACE) 5 MG capsule TAKE 1 CAPSULE BY MOUTH EVERY DAY IN THE MORNING 100 capsule 3   sitaGLIPtin (JANUVIA) 50 MG tablet Take 1 tablet (50 mg total) by mouth daily. 100 tablet 3   No current facility-administered medications for this visit.     Objective:  BP 136/86 Comment: retake in office.  Pulse 71   Temp 98.2 F (36.8 C) (Temporal)   Ht 6' (1.829 m)   Wt (!) 315 lb (142.9 kg)   SpO2 98%   BMI 42.72 kg/m  Gen: NAD, resting comfortably CV: RRR no murmurs rubs or gallops Lungs: CTAB no crackles, wheeze, rhonchi Ext: 1+ on right under compression, trace or 1+ on left Skin: warm, dry     Assessment and Plan    #Diabetes  S:NovoLog 15 to 20 units twice daily(20 lately) with more sedentary activity .  Also takes [place] at night up to 78 units lately- has had to go up to 85 units lately -Take Januvia 50 mg in the morning.  -Gets some sweatiness particularly at night if sugars get below 100-lmost no issues if blood sugars in the 100s range-once again overall symptoms are improved Exercise and diet- has been forced to be more sedentary by surgery- plus that promotes more snacking- feels like sugars have been somewhat higher. As seen a high up to 230 in the morning- average 158 over last 90 days. Plus infection runs sugar up Lab Results  Component Value Date   HGBA1C 6.5 (H) 08/31/2020   HGBA1C 6.9 (H) 06/25/2020   HGBA1C 7.3 (H) 02/24/2020    A/P: A1c has been well controlled-patient has made appropriate adjustments as sugars have increased-overall average appears pretty reasonable considering surgery/sedentary activity related to this.  He has done a great job adjusting and we discussed as he becomes more active he may need to adjust back down-he can let me know if he needs any help with this  He also has diabetic retinopathy-this is being monitored regularly.  Eye exam copy attempt to attain again- Dr. Gershon Crane  Ongoing issues with right foot infection-working with  both podiatry and infectious disease.Marland Kitchen  Antibiotics were finally stopped on 928 . Has had to be out of work with this - goes back 1.5 weeks.    #B12 deficiency/anemia S Current treatment/medication(oral vs IM): Cyanocobalamin 5000 mcg daily. Also was takes ferrous sulfate supplements twice daily.  Lab Results  Component Value Date   VITAMINB12 >2,000 (H) 02/24/2020  A/P: well controlled- continue current meds   # Hyperlipidemia S:Medication: Atorvastatin 60 mg daily (1 and 1/2 tablet of 40 mg), also prefers to take aspirin for primary prevention fenofibrate-did get some myalgias so we have been hesitant to increase dose -but had gotten better at  that time. -was not missing doses but more dietary changes he thinks affected levels bc in past LDL had been below 70.  Lab Results  Component Value Date   CHOL 189 02/24/2020   HDL 38 (L) 02/24/2020   LDLCALC 131 (H) 02/24/2020   LDLDIRECT 61.0 06/14/2019   TRIG 102 02/24/2020   CHOLHDL 5.0 (H) 02/24/2020   A/P: LDL increased back in February-discussed importance of compliance-recheck at physical in 4 months full panel-update direct LDL today-hesitant to increase dose as already has myalgias   #Hypertension/CKD stage III  S: HCTZ 12.5 mg and Ramipril 5 mg  Home readings #s: up someon office checks with being more sedentary BP Readings from Last 3 Encounters:  10/31/20 136/86  10/16/20 137/81  09/19/20 137/87  A/P: Good control repeat today-continue current medication for blood pressure.    For CKD stage III-noted significant worsening on labs 2 weeks ago with creatinine up to 1.79 from baseline around 1.2-9-month ago was trending up to 1.38-we opted to recheck CMP today  Recommended follow up: Return in about 4 months (around 03/03/2021) for physical or sooner if needed. Future Appointments  Date Time Provider [place]  11/06/2020  1:00 PM Price, Christian Mate, DPM [facility]    Lab/Order associations:   ICD-10-CM   1.  Hyperlipidemia associated with type 2 diabetes mellitus (HCC)  E11.69 Comprehensive metabolic panel   Y70.6 LDL cholesterol, direct    2. Hypertension associated with diabetes ([place])  E11.59 Comprehensive metabolic panel   C37.6 LDL cholesterol, direct    3. Type II diabetes mellitus with neurological manifestations ([place])  E11.49     4. Stage 3 chronic kidney disease, unspecified whether stage 3a or 3b CKD (HCC)  N18.30     5. Iron deficiency anemia, unspecified iron deficiency anemia type  D50.9      Meds ordered this encounter  Medications   atorvastatin (LIPITOR) 40 MG tablet    Sig: TAKE 1 AND 1/2 TABLETS BY MOUTH EVERY MORNING    Dispense:  150 tablet    Refill:  3   fenofibrate (TRICOR) 48 MG tablet    Sig: TAKE 1 TABLET BY MOUTH EVERY DAY    Dispense:  100 tablet    Refill:  3   hydrochlorothiazide (MICROZIDE) 12.5 MG capsule    Sig: TAKE 1 CAPSULE BY MOUTH EVERY DAY IN THE MORNING    Dispense:  100 capsule    Refill:  3   sitaGLIPtin (JANUVIA) 50 MG tablet    Sig: Take 1 tablet (50 mg total) by mouth daily.    Dispense:  100 tablet    Refill:  3   ramipril (ALTACE) 5 MG capsule    Sig: TAKE 1 CAPSULE BY MOUTH EVERY DAY IN THE MORNING    Dispense:  100 capsule    Refill:  3    I,Jada Bradford,acting as a scribe for [name], MD.,have documented all relevant documentation on the behalf of [name], MD,as directed by  [name], MD while in the presence of [name], MD.  I, [name], MD, have reviewed all documentation for this visit. The documentation on 10/31/20 for the exam, diagnosis, procedures, and orders are all accurate and complete.  Return precautions advised.  [name], MD

## 2020-10-30 NOTE — Progress Notes (Signed)
  Subjective:  Patient ID: Alphons Koleson Reifsteck [name], male    DOB: [birth year],  MRN: [id number]  No chief complaint on file.    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 male presents with the above complaint. History confirmed with patient. Denies new pedal issues. Denies pain in his foot.  Objective:  Physical Exam: tenderness at the surgical site, local edema noted, and calf supple, nontender. Incision: all wounds right foot healing well, 1st toe wound epithelialized. Biopsy site appears healed.  Third toe amputation site well-healed plantar ulcer with mild hyperkeratosis no maceration, mild drainage. No warmth/eryhema/fluctuance at any of these areas.  Assessment:   1. Post-operative state      Plan:  Patient was evaluated and treated and all questions answered.  Post-operative State -Wound continues to do well. Mild drainage today. Dressedwith betadine and DSD. Discussed back to work and restrictions. I discussed I would like him to wear his inserts and his normal shoes as much as possible. I am worried with return to steel toes he will have continued ulceration.    No follow-ups on file.

## 2020-10-31 ENCOUNTER — Ambulatory Visit (INDEPENDENT_AMBULATORY_CARE_PROVIDER_SITE_OTHER): Payer: BLUE CROSS/BLUE SHIELD | Admitting: Family Medicine

## 2020-10-31 ENCOUNTER — Encounter: Payer: Self-pay | Admitting: Family Medicine

## 2020-10-31 ENCOUNTER — Other Ambulatory Visit: Payer: Self-pay

## 2020-10-31 VITALS — BP 136/86 | HR 71 | Temp 98.2°F | Ht 72.0 in | Wt 315.0 lb

## 2020-10-31 DIAGNOSIS — N183 Chronic kidney disease, stage 3 unspecified: Secondary | ICD-10-CM | POA: Diagnosis not present

## 2020-10-31 DIAGNOSIS — Z23 Encounter for immunization: Secondary | ICD-10-CM | POA: Diagnosis not present

## 2020-10-31 DIAGNOSIS — I152 Hypertension secondary to endocrine disorders: Secondary | ICD-10-CM

## 2020-10-31 DIAGNOSIS — E1159 Type 2 diabetes mellitus with other circulatory complications: Secondary | ICD-10-CM

## 2020-10-31 DIAGNOSIS — E538 Deficiency of other specified B group vitamins: Secondary | ICD-10-CM

## 2020-10-31 DIAGNOSIS — E785 Hyperlipidemia, unspecified: Secondary | ICD-10-CM | POA: Diagnosis not present

## 2020-10-31 DIAGNOSIS — E11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ical complication: Secondary | ICD-10-CM | POA: Diagnosis not present

## 2020-10-31 DIAGNOSIS — E1169 Type 2 diabetes mellitus with other specified complication: Secondary | ICD-10-CM

## 2020-10-31 DIAGNOSIS — D509 Iron deficiency anemia, unspecified: Secondary | ICD-10-CM

## 2020-10-31 DIAGNOSIS — Z1159 Encounter for screening for other viral diseases: Secondary | ICD-10-CM

## 2020-10-31 LAB — COMPREHENSIVE METABOLIC PANEL
ALT: 84 U/L — ABNORMAL HIGH (ref 0–53)
AST: 44 U/L — ABNORMAL HIGH (ref 0–37)
Albumin: 4.2 g/dL (ref 3.5–5.2)
Alkaline Phosphatase: 79 U/L (ref 39–117)
BUN: 20 mg/dL (ref 6–23)
CO2: 30 mEq/L (ref 19–32)
Calcium: 10.1 mg/dL (ref 8.4–10.5)
Chloride: 100 mEq/L (ref 96–112)
Creatinine, Ser: 1.56 mg/dL — ABNORMAL HIGH (ref 0.40–1.50)
GFR: 51.45 mL/min — ABNORMAL LOW (ref >60.00)
Glucose, Bld: 103 mg/dL — ABNORMAL HIGH (ref 70–99)
Potassium: 4.3 mEq/L (ref 3.5–5.1)
Sodium: 138 mEq/L (ref 135–145)
Total Bilirubin: 0.9 mg/dL (ref 0.2–1.2)
Total Protein: 7.8 g/dL (ref 6.0–8.3)

## 2020-10-31 LAB — LDL CHOLESTEROL, DIRECT: Direct LDL: 98 mg/dL

## 2020-10-31 MED ORDER — SITAGLIPTIN PHOSPHATE 50 MG PO TABS: 50.0000 mg | ORAL_TABLET | Freq: Every day | ORAL | 3 refills | Status: DC

## 2020-10-31 MED ORDER — ATORVASTATIN CALCIUM 40 MG PO TABS: ORAL_TABLET | ORAL | 3 refills | Status: DC

## 2020-10-31 MED ORDER — FENOFIBRATE 48 MG PO TABS: ORAL_TABLET | ORAL | 3 refills | Status: DC

## 2020-10-31 MED ORDER — HYDROCHLOROTHIAZIDE 12.5 MG PO CAPS: ORAL_CAPSULE | ORAL | 3 refills | Status: DC

## 2020-10-31 MED ORDER — RAMIPRIL 5 MG PO CAPS: ORAL_CAPSULE | ORAL | 3 refills | Status: DC

## 2020-10-31 NOTE — Patient Instructions (Addendum)
Health Maintenance Due  Topic Date Due   COLONOSCOPY  -we need to reschedule when things get fully settled with foot- when ID/podiatry think in good place you can call  [name] contact Address: [place], [place], [place] [address] Phone: [phone]   Never done   OPHTHALMOLOGY EXAM   -Sign release of information at the check out desk for diabetic eye exam.   06/11/2019   COVID-19 Vaccine (4 - Booster for [employer] series)  - Please consider new bivalent shot this season at your local pharmacy.  -If you had COVID recently, wait until 3 months to get.   06/13/2020   INFLUENZA VACCINE -regular dose shot done today in office.  08/20/2020   Please stop by lab before you go If you have mychart- we will send your results within 3 business days of [place] receiving them.  If you do not have mychart- we will call you about results within 5 business days of [place] receiving them.  *please also note that you will see labs on mychart as soon as they post. I will later go in and write notes on them- will say "notes from Dr. Yong Channel"  Recommended follow up: Return in about 4 months (around 03/03/2021) for physical or sooner if needed.

## 2020-11-01 LAB — HEPATITIS C ANTIBODY
Hepatitis C Ab: NONREACTIVE
SIGNAL TO CUT-OFF: 0.06 (ref <1.00)

## 2020-11-02 ENCOUNTER — Other Ambulatory Visit: Payer: Self-pay

## 2020-11-02 DIAGNOSIS — R7989 Other specified abnormal findings of blood chemistry: Secondary | ICD-10-CM

## 2020-11-05 NOTE — Progress Notes (Signed)
Patient is scheduled   

## 2020-11-06 ENCOUNTER — Encounter: Payer: Self-pay | Admitting: Podiatry

## 2020-11-06 ENCOUNTER — Other Ambulatory Visit: Payer: Self-pay

## 2020-11-06 ENCOUNTER — Ambulatory Visit (INDEPENDENT_AMBULATORY_CARE_PROVIDER_SITE_OTHER): Payer: BLUE CROSS/BLUE SHIELD | Admitting: Podiatry

## 2020-11-06 DIAGNOSIS — E08621 Diabetes mellitus due to underlying condition with foot ulcer: Secondary | ICD-10-CM | POA: Diagnosis not present

## 2020-11-06 DIAGNOSIS — L97513 Non-pressure chronic ulcer of other part of right foot with necrosis of muscle: Secondary | ICD-10-CM | POA: Diagnosis not present

## 2020-11-06 DIAGNOSIS — Z9889 Other specified postprocedural states: Secondary | ICD-10-CM

## 2020-11-06 NOTE — Progress Notes (Signed)
  Subjective:  Patient ID: [name], male    DOB: December 09, 1970,  MRN: [id number]  No chief complaint on file.   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 male presents with the above complaint. History confirmed with patient. Reports only minor drainage, worsened with a lot of activity. Objective:  Physical Exam: no tenderness at the surgical site, local edema noted, and calf supple, nontender. Incision: 1st toe wound epithelialized.  Third toe amputation site well-healed; plantar ulcer with mild hyperkeratosis 0.3cm linear remaining open area with mild maceration, no active drainage. No warmth/eryhema/fluctuance at any of these areas.  Assessment:   1. 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 Post-operative state     Plan:  Patient was evaluated and treated and all questions answered.  Post-operative State -Wound continues to improve - only mild drainage noted with small 0.3 cm linear remaining wound. This was cleansed and minimally debrided today. Dressed with silvadene and border dressing -His insert was off-loaded at the area of his remaining plantar wound. Discussed importance of wearing this insert at all times including at work if he has to wear work [place] to RTW Monday with modified schedule.   Return in about 3 weeks (around 11/27/2020) for Wound Care.

## 2020-11-22 ENCOUNTER — Other Ambulatory Visit (INDEPENDENT_AMBULATORY_CARE_PROVIDER_SITE_OTHER): Payer: BLUE CROSS/BLUE SHIELD

## 2020-11-22 ENCOUNTER — Other Ambulatory Visit: Payer: Self-pay

## 2020-11-22 DIAGNOSIS — R7989 Other specified abnormal findings of blood chemistry: Secondary | ICD-10-CM

## 2020-11-22 LAB — COMPREHENSIVE METABOLIC PANEL
ALT: 30 U/L (ref 0–53)
AST: 23 U/L (ref 0–37)
Albumin: 4.1 g/dL (ref 3.5–5.2)
Alkaline Phosphatase: 64 U/L (ref 39–117)
BUN: 21 mg/dL (ref 6–23)
CO2: 29 mEq/L (ref 19–32)
Calcium: 9.5 mg/dL (ref 8.4–10.5)
Chloride: 104 mEq/L (ref 96–112)
Creatinine, Ser: 1.53 mg/dL — ABNORMAL HIGH (ref 0.40–1.50)
GFR: 52.64 mL/min — ABNORMAL LOW (ref >60.00)
Glucose, Bld: 77 mg/dL (ref 70–99)
Potassium: 3.9 mEq/L (ref 3.5–5.1)
Sodium: 139 mEq/L (ref 135–145)
Total Bilirubin: 0.9 mg/dL (ref 0.2–1.2)
Total Protein: 7.8 g/dL (ref 6.0–8.3)

## 2020-11-27 ENCOUNTER — Other Ambulatory Visit: Payer: Self-pay

## 2020-11-27 ENCOUNTER — Ambulatory Visit (INDEPENDENT_AMBULATORY_CARE_PROVIDER_SITE_OTHER): Payer: BLUE CROSS/BLUE SHIELD | Admitting: Podiatry

## 2020-11-27 DIAGNOSIS — E08621 Diabetes mellitus due to underlying condition with foot ulcer: Secondary | ICD-10-CM

## 2020-11-27 DIAGNOSIS — L97513 Non-pressure chronic ulcer of other part of right foot with necrosis of muscle: Secondary | ICD-10-CM | POA: Diagnosis not present

## 2020-11-27 DIAGNOSIS — T81509A Unspecified complication of foreign body accidentally left in body following unspecified procedure, initial encounter: Secondary | ICD-10-CM

## 2020-11-27 DIAGNOSIS — Z9889 Other specified postprocedural states: Secondary | ICD-10-CM

## 2020-11-27 NOTE — Progress Notes (Signed)
  Subjective:  Patient ID: [name], male    DOB: 01-Jan-1971,  MRN: [id number]  Chief Complaint  Patient presents with   Wound Check    Right foot wound check follow up Pt states he feels like foot is doing good.  Pt states he is having minimal drainage.    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 male presents with the above complaint. History confirmed with patient. Objective:  Physical Exam: no tenderness at the surgical site, local edema noted, and calf supple, nontender. Incision: 1st toe wound epithelialized.  Third toe amputation site well-healed; plantar ulcer with mild hyperkeratosis 0.3cm linear remaining open area with mild maceration, no active drainage. No warmth/eryhema/fluctuance at any of these areas.  Assessment:   1. 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 Post-operative state   3. Postoperative foreign body, initial encounter      Plan:  Patient was evaluated and treated and all questions answered.  Post-operative State -Wound about the same, with small linear remaining wound. Again cleansed and minimally debrided today. Dressed with silvadene and border dressing. Continue to apply SSD several days a week. -Offloaded his other insert that he had as he states the other insert I offloaded helped. Hopefully this will help heal the remainder of the wound as he is only partially wearing the better offloaded CMOS (they are in his work boots). -Does not feel that returning to work has had a negative impact on his foot.  Return in about 1 month (around 12/27/2020) for Wound Care.

## 2020-12-18 ENCOUNTER — Telehealth: Payer: Self-pay | Admitting: Podiatry

## 2020-12-18 NOTE — Telephone Encounter (Signed)
Patient wife called again she is at home now, use the house number.

## 2020-12-18 NOTE — Telephone Encounter (Signed)
Called and spoke to patient's wife and advised her to soak the area in epsom salt to dry it out and apply betadine. Advised if the wound worsens to call promptly for eval

## 2020-12-18 NOTE — Telephone Encounter (Signed)
Wife would like a call back from Dr March Rummage or his nurse to discuss her husbands current condition.  She said that the section where Dr March Rummage worked on his foot now has a cottage cheese look to it? Patient started wearing boot on Sunday because he said his foot was tender.

## 2020-12-25 NOTE — Telephone Encounter (Signed)
Spoke to patient's wife 12/18/20

## 2020-12-28 ENCOUNTER — Ambulatory Visit (INDEPENDENT_AMBULATORY_CARE_PROVIDER_SITE_OTHER): Payer: BLUE CROSS/BLUE SHIELD | Admitting: Podiatry

## 2020-12-28 ENCOUNTER — Other Ambulatory Visit: Payer: Self-pay

## 2020-12-28 DIAGNOSIS — L97513 Non-pressure chronic ulcer of other part of right foot with necrosis of muscle: Secondary | ICD-10-CM

## 2020-12-28 DIAGNOSIS — E08621 Diabetes mellitus due to underlying condition with foot ulcer: Secondary | ICD-10-CM | POA: Diagnosis not present

## 2020-12-28 NOTE — Progress Notes (Signed)
  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 male presents with the above complaint. History confirmed with patient. States his wife noticed some white cheese like drainage to the right foot but he has not noticed any since. Objective:  Physical Exam: no tenderness at the surgical site, local edema noted, and calf supple, nontender. Incision: 1st toe wound epithelialized.  Third toe amputation site well-healed; plantar ulcer with mild hyperkeratosis 1*0.5  open area with mild maceration, no active drainage. No warmth/eryhema/fluctuance at any of these areas.  Assessment:   1. 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  Right foot ulcer -Wound appears to have worsened. Debrided and further offloaded patient's DM insert. Cleansed and dressed with silvadene and foam border dressing. Will f/u in one month to see if the offloading is adequately reducing pressure. Unfortuantely patient has a very difficult foot type and this will likely be hard to fully offload.  Procedure: Excisional Debridement of Wound Indication: Removal of non-viable soft tissue from the wound to promote healing.  Anesthesia: none Pre-Debridement Wound Measurements: 0.5 cm x 0.5 cm x 0.2 cm  Post-Debridement Wound Measurements: 1 cm x 0.5 cm x 0.3 cm  Type of Debridement: Sharp Excisional Tissue Removed: Non-viable soft tissue Instrumentation: 15 blade and tissue nipper Depth of  Debridement: subcutaneous tissue. Technique: Sharp excisional debridement to bleeding, viable wound base.  Dressing: Dry, sterile, compression dressing. Disposition: Patient tolerated procedure well.   Return in about 3 weeks (around 01/18/2021).

## 2021-01-06 ENCOUNTER — Other Ambulatory Visit: Payer: Self-pay | Admitting: Family Medicine

## 2021-01-25 ENCOUNTER — Ambulatory Visit: Payer: BLUE CROSS/BLUE SHIELD | Admitting: Podiatry

## 2021-01-25 ENCOUNTER — Other Ambulatory Visit: Payer: Self-pay

## 2021-01-25 DIAGNOSIS — E08621 Diabetes mellitus due to underlying condition with foot ulcer: Secondary | ICD-10-CM | POA: Diagnosis not present

## 2021-01-25 DIAGNOSIS — L97513 Non-pressure chronic ulcer of other part of right foot with necrosis of muscle: Secondary | ICD-10-CM

## 2021-01-28 NOTE — Progress Notes (Signed)
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 male presents with the above complaint. History confirmed with patient. Reports very little drainage. States it is painful after walking too much. Changes wound daily with either silvadene/idosorb. Objective:  Physical Exam: no tenderness at the surgical site, local edema noted, and calf supple, nontender. Incision: 1st toe wound epithelialized.  Third toe amputation site well-healed; plantar ulcer with mild hyperkeratosis 2x0.3 open area with mild maceration, no active drainage. No warmth/eryhema/fluctuance at any of these areas.  Assessment:   1. 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  Right foot ulcer -Wound a little worse, but well appearing. Continue iodosorb. Dressed as such today. Minimally debrided. Will have patient f/u with orthotist to see if we can further offload his insert. His foot type is very difficult to prevent plantar lateral midfoot pressure and he may benefit from some bracing to this effect.   No follow-ups on file.

## 2021-02-19 ENCOUNTER — Other Ambulatory Visit: Payer: Self-pay

## 2021-02-19 ENCOUNTER — Encounter: Payer: Self-pay | Admitting: Internal Medicine

## 2021-02-19 ENCOUNTER — Ambulatory Visit (INDEPENDENT_AMBULATORY_CARE_PROVIDER_SITE_OTHER): Payer: BLUE CROSS/BLUE SHIELD | Admitting: Podiatry

## 2021-02-19 DIAGNOSIS — L97513 Non-pressure chronic ulcer of other part of right foot with necrosis of muscle: Secondary | ICD-10-CM | POA: Diagnosis not present

## 2021-02-19 DIAGNOSIS — E08621 Diabetes mellitus due to underlying condition with foot ulcer: Secondary | ICD-10-CM | POA: Diagnosis not present

## 2021-02-19 NOTE — Progress Notes (Signed)
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 male presents with the above complaint. History confirmed with patient.  States that there has been very little drainage of the wound no new pain denies nausea vomiting fever chills and shortness of breath Objective:  Physical Exam: no tenderness at the surgical site, local edema noted, and calf supple, nontender. Incision: 1st toe wound epithelialized.  Third toe amputation site well-healed; plantar ulcer with mild hyperkeratosis 4x0.3 open area with mild maceration, no active drainage. No warmth/eryhema/fluctuance at any of these areas.  Assessment:   1. 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  Right foot ulcer -Wound a little worse, but well appearing.  Minimally debrided wound and dressed with Betadine wet-to-dry. Still pending appt with our orthotist to see if we can further offload his insert. His foot type is very difficult to prevent plantar lateral midfoot pressure and he may benefit from some bracing to this effect.   Return in about 4 weeks (around 03/19/2021).

## 2021-03-18 ENCOUNTER — Telehealth: Payer: Self-pay | Admitting: Family Medicine

## 2021-03-18 MED ORDER — INSULIN DEGLUDEC 100 UNIT/ML ~~LOC~~ SOLN: SUBCUTANEOUS | 3 refills | Status: DC

## 2021-03-18 NOTE — Telephone Encounter (Signed)
Pt's wife states they are requesting a 90 day supply of his medication which should be 6 vials.

## 2021-03-18 NOTE — Telephone Encounter (Signed)
.. °  Encourage patient to contact the pharmacy for refills or they can request refills through [place]:  10/31/20  NEXT APPOINTMENT DATE: 05/15/21  MEDICATION:TRESIBA 100 UNIT/ML SOLN  Is the patient out of medication? yes  PHARMACY: [facility], [place] Phone:  [phone]  Fax:  [phone]      Let patient know to contact pharmacy at the end of the day to make sure medication is ready.  Please notify patient to allow 48-72 hours to process

## 2021-03-18 NOTE — Telephone Encounter (Signed)
Rx refilled.

## 2021-03-19 ENCOUNTER — Other Ambulatory Visit: Payer: BLUE CROSS/BLUE SHIELD

## 2021-03-19 ENCOUNTER — Ambulatory Visit: Payer: BLUE CROSS/BLUE SHIELD | Admitting: Podiatry

## 2021-03-22 ENCOUNTER — Other Ambulatory Visit: Payer: Self-pay

## 2021-03-22 ENCOUNTER — Ambulatory Visit: Payer: BLUE CROSS/BLUE SHIELD | Admitting: Podiatry

## 2021-03-22 ENCOUNTER — Other Ambulatory Visit: Payer: BLUE CROSS/BLUE SHIELD

## 2021-03-22 DIAGNOSIS — E08621 Diabetes mellitus due to underlying condition with foot ulcer: Secondary | ICD-10-CM | POA: Diagnosis not present

## 2021-03-22 DIAGNOSIS — L97513 Non-pressure chronic ulcer of other part of right foot with necrosis of muscle: Secondary | ICD-10-CM

## 2021-03-22 MED ORDER — DOXYCYCLINE HYCLATE 100 MG PO TABS: 100.0000 mg | ORAL_TABLET | Freq: Two times a day (BID) | ORAL | 0 refills | Status: AC

## 2021-03-25 ENCOUNTER — Ambulatory Visit (AMBULATORY_SURGERY_CENTER): Payer: BLUE CROSS/BLUE SHIELD | Admitting: *Deleted

## 2021-03-25 ENCOUNTER — Other Ambulatory Visit: Payer: Self-pay

## 2021-03-25 VITALS — Ht 72.0 in | Wt 316.0 lb

## 2021-03-25 DIAGNOSIS — Z1211 Encounter for screening for malignant neoplasm of colon: Secondary | ICD-10-CM

## 2021-03-25 NOTE — Progress Notes (Signed)
Patient's pre-visit was done today over the phone with the patient. Name,DOB and address verified. Patient denies any allergies to Eggs and Soy. Patient denies any problems with anesthesia/sedation. Patient is not taking any diet pills or blood thinners. No home Oxygen.  ° °Prep instructions sent to pt's MyChart-pt aware. Patient understands to call us back with any questions or concerns. Patient is aware of our care-partner policy and Covid-19 safety protocol.  ° °EMMI education assigned to the patient for the procedure, sent to MyChart.  ° °The patient is COVID-19 vaccinated.   °

## 2021-03-27 NOTE — Progress Notes (Signed)
?  Subjective:  ?Patient ID: [name], male    DOB: [birth year],  MRN: [id number] ? ?Chief Complaint  ?Patient presents with  ? Wound Check  ?  Wound care   ? ?Date of Surgery: 09/03/20 ?Procedures: ?            1) Right foot distal phalanx great toe bone biopsy ?            2) Debridement of wound right foot - to bone ?            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            4) Insertion of abx beads ? ?51 y.o. male presents with the above complaint. History confirmed with patient. Reports very little drainage. States it is painful after walking too much.  Continue Betadine wet-to-dry dressing.  He is known to Dr. March Rummage and had listed above procedure done. ?Objective:  ?Physical Exam: no tenderness at the surgical site, local edema noted, and calf supple, nontender. ?Incision: Right plantar foot wound probing down to deep tissue.  No malodor present no erythema noted.  No purulent drainage was expressed. ? ?Assessment:  ? ?1. 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 ? ? ?Plan:  ?Patient was evaluated and treated and all questions answered. ? ?Right foot ulcer with probing down to deep tissue ?-Wound a little worse, but well appearing. Continue iodosorb. Dressed as such today. Minimally debrided. -I will place him on doxycycline for next 30 days.  I encouraged him to do Betadine wet-to-dry dressing changes. ? ?No follow-ups on file.  ? ? ?

## 2021-03-28 ENCOUNTER — Encounter: Payer: Self-pay | Admitting: Internal Medicine

## 2021-03-30 ENCOUNTER — Other Ambulatory Visit: Payer: Self-pay | Admitting: Family Medicine

## 2021-04-01 LAB — HM DIABETES EYE EXAM

## 2021-04-08 ENCOUNTER — Ambulatory Visit (AMBULATORY_SURGERY_CENTER): Payer: BLUE CROSS/BLUE SHIELD | Admitting: Internal Medicine

## 2021-04-08 ENCOUNTER — Encounter: Payer: Self-pay | Admitting: Internal Medicine

## 2021-04-08 VITALS — BP 156/63 | HR 77 | Temp 98.2°F | Resp 14 | Ht 73.0 in | Wt 316.0 lb

## 2021-04-08 DIAGNOSIS — Z1211 Encounter for screening for malignant neoplasm of colon: Secondary | ICD-10-CM | POA: Diagnosis present

## 2021-04-08 HISTORY — PX: COLONOSCOPY: SHX174

## 2021-04-08 MED ORDER — SODIUM CHLORIDE 0.9 % IV SOLN: 500.0000 mL | Freq: Once | INTRAVENOUS | Status: DC

## 2021-04-08 NOTE — Progress Notes (Signed)
Report to PACU, RN, vss, BBS= Clear.  

## 2021-04-08 NOTE — Progress Notes (Deleted)
Pt's states no medical or surgical changes since previsit or office visit.  Vitals DT 

## 2021-04-08 NOTE — Progress Notes (Signed)
[facility] History and Physical ? ? ?Primary Care Physician:  Marin Olp, MD ? ? ?Reason for Procedure:   Colon cancer screening ? ?Plan:    colonoscopy ? ? ? ? ?HPI: [name] is a 51 y.o. male here for CRCA screening exam. ? ? ?Past Medical History:  ?Diagnosis Date  ? Cellulitis and abscess of foot   ? 05/ 2021 left  ? CKD (chronic kidney disease), stage III ([place]) 06-17-2019 per pt last visit 05-29-2016 (note in care everywhere)  currently followed by pcp  ? nephrologist-  dr [name] ([facility] in [place])    ? Diabetic peripheral neuropathy ([place])   ? Elevated LFTs   ? followed by pcp  ? Hypertension   ? followed by pcp---- (06-17-2019 pt stated never had a stress test)  ? Iron deficiency anemia   ? Mixed hyperlipidemia   ? 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 11-06-2016  lower extremity angiography (dr [name])  mil tibial disease w/ occlusion of the distal peroneal artery and dorsalis pedis artery but has widely patent posterior tibial and anterior tibial arterties  ? Type 2 diabetes mellitus treated with insulin ([place])   ? followed by pcp   (06-17-2019  pt stated checks blood sugar daily in am,  fasting sugar-- average 71)  ? Vitamin B 12 deficiency   ? ? ?Past Surgical History:  ?Procedure Laterality Date  ? AMPUTATION Right 07/31/2016  ? Procedure: AMPUTATION RIGHT FIFTH RAY AND APPLICATION OF WOUND VAC;  Surgeon: Dorna Leitz, MD;  Location: [facility];  Service: Orthopedics;  Laterality: Right;  ? AMPUTATION Right 08/08/2016  ? Procedure: RAY AMPUTATION RIGHT FOURTH METATARSAL, DEBRIDEMENT OF SUBQ TISSUE,BONE AND SKIN WITH WOUND CLOSURE;  Surgeon: Dorna Leitz, MD;  Location: [facility];  Service: Orthopedics;  Laterality: Right;  ? AMPUTATION TOE Left 06/21/2019  ? Procedure: AMPUTATION TOE SECOND LEFT,  DEBRIDEMENT OF LEFT GREAT TOE;  Surgeon: [name], DPM;  Location: [facility];  Service: Podiatry;  Laterality: Left;  ? AMPUTATION TOE Right 09/03/2020  ? Procedure: AMPUTATION TOE;  Surgeon: [name], DPM;  [facility]: [facility];  Service: Podiatry;  Laterality: Right;  ? ANAL FISSURE REPAIR  2000  ? APPLICATION OF A-CELL OF EXTREMITY Right 08/13/2016  ? Procedure: APPLICATION OF A-CELL AND VAC;  Surgeon: Wallace Going, DO;  Location: [facility];  Service: Plastics;  Laterality: Right;  ? APPLICATION OF A-CELL OF EXTREMITY Right 07/16/2017  ? Procedure: CELLERATE PLACEMENT;  Surgeon: Wallace Going, DO;  Location: [facility];  Service: Plastics;  Laterality: Right;  ? APPLICATION OF WOUND VAC Right 09/18/2016  ? Procedure: APPLICATION OF WOUND VAC (PT. WILL BRING HIS OWN);  Surgeon: Wallace Going, DO;  Location: [facility];  Service: Plastics;  Laterality: Right;  ? APPLICATION OF WOUND VAC Right 10/30/2016  ? Procedure: APPLICATION OF WOUND VAC;  Surgeon: Wallace Going, DO;  Location: [facility];  Service: Plastics;  Laterality: Right;  ? APPLICATION OF WOUND VAC Right 12/01/2016  ? Procedure: APPLICATION OF WOUND VAC;  Surgeon: Wallace Going, DO;  [facility];  Service: Plastics;  Laterality: Right;  ? BONE BIOPSY Left 01/04/2018  ? Procedure: LEFT FOOT BONE BIOPSY;  Surgeon: [name], DPM;  Location:  [facility];  Service: Podiatry;  Laterality: Left;  ? BONE BIOPSY Right 09/03/2020  ? Procedure: BONE BIOPSY;  Surgeon: [name], DPM;  [facility];  Service: Podiatry;  Laterality: Right;  ? CIRCUMCISION  2006  ? COLONOSCOPY  04/08/2021  ? DEBRIDEMENT  FOOT Right 07/16/2017  ? I & D EXTREMITY Right 09/18/2016  ? Procedure: IRRIGATION AND DEBRIDEMENT OF RIGHT LATERAL DIABLTIC FOOT ULCER;  Surgeon: Wallace Going, DO;  Location: [place] [facility];  Service: Plastics;   Laterality: Right;  ? I & D EXTREMITY Right 03/11/2017  ? Procedure: IRRIGATION AND DEBRIDEMENT OF RIGHT LATERAL FOOT WOUND WITH CELLERATE COLLAGEN;  Surgeon: Wallace Going, DO;  Location: [facility];  Service: Plastics;  Laterality: Right;  ? INCISION AND DRAINAGE OF WOUND Right 08/13/2016  ? Procedure: IRRIGATION AND DEBRIDEMENT WOUND RIGHT FOOT;  Surgeon: Wallace Going, DO;  [facility];  Service: Plastics;  Laterality: Right;  ? IR PATIENT EVAL TECH 0-60 MINS  01/12/2020  ? LAPAROSCOPIC CHOLECYSTECTOMY  09-09-2006   dr Zella Richer  ? LOWER EXTREMITY ANGIOGRAPHY N/A 10/27/2016  ? Procedure: Lower Extremity Angiography;  Surgeon: Angelia Mould, MD;  Location: [facility];  Service: Cardiovascular;  Laterality: N/A;  ? MASS EXCISION Right 10/30/2016  ? Procedure: EXCISION RIGHT FOOT WOUND WITH VAC PLACEMENT;  Surgeon: Wallace Going, DO;  Location: [place];  Service: Plastics;  Laterality: Right;  ? MASS EXCISION Right 07/16/2017  ? Procedure: EXCISION OF RIGHT FOOT WOUND WITH CELLERATE PLACEMENT;  Surgeon: Wallace Going, DO;  Location: [place];  Service: Plastics;  Laterality: Right;  ? METATARSAL HEAD EXCISION Left 01/04/2018  ? Procedure: 5TH METATARSAL RESECTION,  WOUND CLOSURE,  Adjacent Tissue Transfer.;  Surgeon: [name], DPM;  Location: [facility];  Service: Podiatry;  Laterality: Left;  ? SKIN SPLIT GRAFT Right 12/01/2016  ? Procedure: SKIN GRAFT SPLIT THICKNESS TO RIGHT LATERAL FOOT WOUND 9 X 3 CM;  Surgeon: Wallace Going, DO;  [facility];  Service: Plastics;  Laterality: Right;  ? wisdom teeth extracted  1984  ? WOUND DEBRIDEMENT Right 09/03/2020  ? Procedure: DEBRIDEMENT WOUND, INSERTION OF ANTIBIOTIC BEADS;  Surgeon: [name], DPM;  [facility];  Service: Podiatry;  Laterality: Right;  ? ? ?Prior to Admission medications   ?Medication Sig Start Date End Date Taking?  Authorizing Provider  ?ascorbic acid (VITAMIN C) 500 MG tablet Take 500 mg by mouth daily. 08/14/16  Yes [provider]  ?aspirin EC 81 MG tablet Take 81 mg by mouth daily.   Yes [provider]  ?atorvastatin (LIPITOR) 40 MG tablet TAKE 1 AND 1/2 TABLETS BY MOUTH EVERY MORNING 10/31/20  Yes Marin Olp, MD  ?Cyanocobalamin (VITAMIN B-12) 5000 MCG SUBL Place 5,000 mcg under the tongue daily.   Yes [provider]  ?fenofibrate (TRICOR) 48 MG tablet TAKE 1 TABLET BY MOUTH EVERY DAY 10/31/20  Yes Marin Olp, MD  ?glucose blood (ACCU-CHEK GUIDE) test strip Dx E11.49 - Use to check blood glucose three times daily or as directed. 01/30/20  Yes Marin Olp, MD  ?hydrochlorothiazide (MICROZIDE) 12.5 MG capsule TAKE 1 CAPSULE BY MOUTH EVERY DAY IN THE MORNING 10/31/20  Yes Marin Olp, MD  ?Insulin Degludec (TRESIBA) 100 UNIT/ML SOLN INJECT 64 UNITS UNDER THE SKIN AT BEDTIME. 03/18/21  Yes Marin Olp, MD  ?Insulin Syringe-Needle U-100 (BD INSULIN SYRINGE U/F) 31G X  5/16" 0.5 ML MISC Use to inject insulin 4 times daily 10/20/19  Yes Marin Olp, MD  ?Insulin Syringe-Needle U-100 (BD INSULIN SYRINGE U/F) 31G X 5/16" 1 ML MISC Use to inject tresiba/long acting insulin once daily 02/24/20  Yes Marin Olp, MD  ?Insulin Syringe-Needle U-100 (BD VEO INSULIN SYR U/F 1/2UNIT) 31G X 15/64" 0.3 ML MISC USE TO INJECT INSULIN FOUR TIMES DAILY AS DIRECTED 10/14/19  Yes Marin Olp, MD  ?Lancets [facility] ULTRASOFT) lancets Use to test blood sugars daily. Dx: E11.9 04/11/20  Yes Marin Olp, MD  ?Lancets MISC USED TO CHECK BLOOD GLUCOSE 3-4 TIMES DAILY 10/15/18  Yes Marin Olp, MD  ?Multiple Vitamin (MULTIVITAMIN WITH MINERALS) TABS tablet Take 1 tablet by mouth 2 (two) times daily.   Yes [provider]  ?NOVOLOG 100 UNIT/ML injection INJECT 15 TO 25 UNITS UNDER THE SKIN TWICE DAILY 04/01/21  Yes Marin Olp, MD  ?Omega-3 Fatty Acids (FISH  OIL) 1360 MG CAPS Take 1,360 mg by mouth daily.    Yes [provider]  ?pyridoxine (B-6) 100 MG tablet Take 100 mg by mouth 2 (two) times daily.   Yes [provider]  ?ramipril (ALTACE) 5 MG capsule TAKE 1 CA

## 2021-04-08 NOTE — Patient Instructions (Addendum)
I am pleased to tell you that the colonoscopy was normal. No polyps and no cancer seen. ? ?Next routine colonoscopy or other screening test in 10 years - 2033. ? ?Enjoy your upcoming birthday! ? ?I appreciate the opportunity to care for you. ?Gatha Mayer, MD, [facility] ? ?Resume previous diet and medications. ? ? ? ?YOU HAD AN ENDOSCOPIC PROCEDURE TODAY AT [facility]:   Refer to the procedure report that was given to you for any specific questions about what was found during the examination.  If the procedure report does not answer your questions, please call your gastroenterologist to clarify.  If you requested that your care partner not be given the details of your procedure findings, then the procedure report has been included in a sealed envelope for you to review at your convenience later. ? ?YOU SHOULD EXPECT: Some feelings of bloating in the abdomen. Passage of more gas than usual.  Walking can help get rid of the air that was put into your GI tract during the procedure and reduce the bloating. If you had a lower endoscopy (such as a colonoscopy or flexible sigmoidoscopy) you may notice spotting of blood in your stool or on the toilet paper. If you underwent a bowel prep for your procedure, you may not have a normal bowel movement for a few days. ? ?Please Note:  You might notice some irritation and congestion in your nose or some drainage.  This is from the oxygen used during your procedure.  There is no need for concern and it should clear up in a day or so. ? ?SYMPTOMS TO REPORT IMMEDIATELY: ? ?Following lower endoscopy (colonoscopy or flexible sigmoidoscopy): ? Excessive amounts of blood in the stool ? Significant tenderness or worsening of abdominal pains ? Swelling of the abdomen that is new, acute ? Fever of 100?F or higher ? ? ?For urgent or emergent issues, a gastroenterologist can be reached at any hour by calling [phone]. ?Do not use MyChart messaging for urgent concerns.   ? ? ?DIET:  We do recommend a small meal at first, but then you may proceed to your regular diet.  Drink plenty of fluids but you should avoid alcoholic beverages for 24 hours. ? ?ACTIVITY:  You should plan to take it easy for the rest of today and you should NOT DRIVE or use heavy machinery until tomorrow (because of the sedation medicines used during the test).   ? ?FOLLOW UP: ?Our staff will call the number listed on your records 48-72 hours following your procedure to check on you and address any questions or concerns that you may have regarding the information given to you following your procedure. If we do not reach you, we will leave a message.  We will attempt to reach you two times.  During this call, we will ask if you have developed any symptoms of COVID 19. If you develop any symptoms (ie: fever, flu-like symptoms, shortness of breath, cough etc.) before then, please call [phone].  If you test positive for Covid 19 in the 2 weeks post procedure, please call and report this information to [place].   ? ?If any biopsies were taken you will be contacted by phone or by letter within the next 1-3 weeks.  Please call us at [phone] if you have not heard about the biopsies in 3 weeks.  ? ? ?SIGNATURES/CONFIDENTIALITY: ?You and/or your care partner have signed paperwork which will be entered into your electronic medical record.  These signatures attest to the fact that that the information above on your After Visit Summary has been reviewed and is understood.  Full responsibility of the confidentiality of this discharge information lies with you and/or your care-partner.  ?

## 2021-04-08 NOTE — Op Note (Signed)
[place] ?Patient Name: [name] ?Procedure Date: 04/08/2021 12:50 PM ?MRN: [id number] ?Endoscopist: Gatha Mayer , MD ?Age: 51 ?Referring MD:  ?Date of Birth: February 20, 1970 ?Gender: Male ?Account #: [id number] ?Procedure:                Colonoscopy ?Indications:              Screening for colorectal malignant neoplasm, This  ?                          is the patient's first colonoscopy ?Medicines:                Monitored Anesthesia Care ?Procedure:                Pre-Anesthesia Assessment: ?                          - Prior to the procedure, a History and Physical  ?                          was performed, and patient medications and  ?                          allergies were reviewed. The patient's tolerance of  ?                          previous anesthesia was also reviewed. The risks  ?                          and benefits of the procedure and the sedation  ?                          options and risks were discussed with the patient.  ?                          All questions were answered, and informed consent  ?                          was obtained. Prior Anticoagulants: The patient has  ?                          taken no previous anticoagulant or antiplatelet  ?                          agents. ASA Grade Assessment: III - A patient with  ?                          severe systemic disease. After reviewing the risks  ?                          and benefits, the patient was deemed in  ?                          satisfactory condition to undergo the procedure. ?  After obtaining informed consent, the colonoscope  ?                          was passed under direct vision. Throughout the  ?                          procedure, the patient's blood pressure, pulse, and  ?                          oxygen saturations were monitored continuously. The  ?                          Olympus CF-HQ190L (#[id number]) Colonoscope was  ?                          introduced through the anus  and advanced to the the  ?                          cecum, identified by appendiceal orifice and  ?                          ileocecal valve. The colonoscopy was somewhat  ?                          difficult due to significant looping. Successful  ?                          completion of the procedure was aided by applying  ?                          abdominal pressure. The patient tolerated the  ?                          procedure well. The quality of the bowel  ?                          preparation was good. The ileocecal valve,  ?                          appendiceal orifice, and rectum were photographed.  ?                          The bowel preparation used was Miralax via split  ?                          dose instruction. ?Scope In: 2:04:46 PM ?Scope Out: 2:20:04 PM ?Scope Withdrawal Time: 0 hours 8 minutes 36 seconds  ?Total Procedure Duration: 0 hours 15 minutes 18 seconds  ?Findings:                 The perianal and digital rectal examinations were  ?                          normal. ?  The entire examined colon appeared normal on direct  ?                          and retroflexion views. ?Complications:            No immediate complications. ?Estimated Blood Loss:     Estimated blood loss: none. ?Impression:               - The entire examined colon is normal on direct and  ?                          retroflexion views. ?                          - No specimens collected. ?Recommendation:           - Patient has a contact number available for  ?                          emergencies. The signs and symptoms of potential  ?                          delayed complications were discussed with the  ?                          patient. Return to normal activities tomorrow.  ?                          Written discharge instructions were provided to the  ?                          patient. ?                          - Resume previous diet. ?                          - Continue present  medications. ?                          - Repeat colonoscopy in 10 years for screening  ?                          purposes. ?Gatha Mayer, MD ?04/08/2021 2:27:16 PM ?This report has been signed electronically. ?

## 2021-04-08 NOTE — Progress Notes (Signed)
Pt's states no medical or surgical changes since previsit or office visit.  ° °Vitals CW °

## 2021-04-10 ENCOUNTER — Telehealth: Payer: Self-pay

## 2021-04-10 NOTE — Telephone Encounter (Signed)
?  Follow up Call- ? ?Call back number 04/08/2021  ?Post procedure Call Back phone  # [phone]  ?Permission to leave phone message Yes  ?Some recent data might be hidden  ?  ? ?Patient questions: ? ?Do you have a fever, pain , or abdominal swelling? No. ?Pain Score  0 * ? ?Have you tolerated food without any problems? Yes.   ? ?Have you been able to return to your normal activities? Yes.   ? ?Do you have any questions about your discharge instructions: ?Diet   No. ?Medications  No. ?Follow up visit  No. ? ?Do you have questions or concerns about your Care? No. ? ?Actions: ?* If pain score is 4 or above: ?No action needed, pain <4. ? ?Have you developed a fever since your procedure? No  ? ?2.   Have you had an respiratory symptoms (SOB or cough) since your procedure? no ? ?3.   Have you tested positive for COVID 19 since your procedure no ? ?4.   Have you had any family members/close contacts diagnosed with the COVID 19 since your procedure?  no ? ? ?If yes to any of these questions please route to Joylene John, RN and Joella Prince, RN  ? ? ?

## 2021-04-12 ENCOUNTER — Ambulatory Visit: Payer: BLUE CROSS/BLUE SHIELD | Admitting: Podiatry

## 2021-04-12 ENCOUNTER — Other Ambulatory Visit: Payer: Self-pay

## 2021-04-12 DIAGNOSIS — E08621 Diabetes mellitus due to underlying condition with foot ulcer: Secondary | ICD-10-CM | POA: Diagnosis not present

## 2021-04-12 DIAGNOSIS — I999 Unspecified disorder of circulatory system: Secondary | ICD-10-CM | POA: Diagnosis not present

## 2021-04-12 DIAGNOSIS — L97512 Non-pressure chronic ulcer of other part of right foot with fat layer exposed: Secondary | ICD-10-CM

## 2021-04-17 NOTE — Progress Notes (Signed)
? ?Phone: [phone] ?  ?Subjective:  ?Patient presents today for their annual physical. Chief complaint-noted.  ? ?See problem oriented charting- ?ROS- full  review of systems was completed and negative  ?except for: insomnia related to working 3rd shift for years- doing days now but still has issues ? ?The following were reviewed and entered/updated in epic: ?Past Medical History:  ?Diagnosis Date  ? Cellulitis and abscess of foot   ? 05/ 2021 left  ? CKD (chronic kidney disease), stage III ([place]) 06-17-2019 per pt last visit 05-29-2016 (note in care everywhere)  currently followed by pcp  ? nephrologist-  dr [name] ([facility] in [place])    ? Diabetic peripheral neuropathy ([place])   ? Elevated LFTs   ? followed by pcp  ? Hypertension   ? followed by pcp---- (06-17-2019 pt stated never had a stress test)  ? Iron deficiency anemia   ? Mixed hyperlipidemia   ? 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 11-06-2016  lower extremity angiography (dr [name])  mil tibial disease w/ occlusion of the distal peroneal artery and dorsalis pedis artery but has widely patent posterior tibial and anterior tibial arterties  ? Type 2 diabetes mellitus treated with insulin ([place])   ? followed by pcp   (06-17-2019  pt stated checks blood sugar daily in am,  fasting sugar-- average 71)  ? Vitamin B 12 deficiency   ? ?Patient Active Problem List  ? Diagnosis Date Noted  ? Morbid obesity ([place]) 03/08/2015  ?  Priority: High  ? Type II diabetes mellitus with neurological manifestations ([place]) 03/07/2015  ?  Priority: High  ? Diabetic retinopathy ([place]) 01/25/2018  ?  Priority: Medium   ? CKD (chronic kidney disease), stage II 11/17/2016  ?  Priority: Medium   ? Diabetic neuropathy ([place]) 11/17/2016  ?  Priority: Medium   ? Elevated LFTs   ?  Priority: Medium   ?  Hyperlipidemia associated with type 2 diabetes mellitus ([place]) 03/08/2015  ?  Priority: Medium   ? Hypertension associated with diabetes ([place]) 03/08/2015  ?  Priority: Medium   ? Charcot's joint of foot, right 11/18/2019  ?  Priority: Low  ? Status post split thickness skin graft 12/08/2016  ?  Priority: Low  ? B12 deficiency 11/17/2016  ?  Priority: Low  ? Iron deficiency anemia 11/17/2016  ?  Priority: Low  ? 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  Priority: Low  ? 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  Priority: Low  ? Ulcer of right foot with necrosis of bone ([place])   ? Osteomyelitis ([place]) 08/31/2020  ? Occlusion of peripherally inserted central catheter (PICC) line ([place]) 01/10/2020  ? Diabetic foot ulcer ([place]) 11/18/2019  ? Pyogenic inflammation of bone ([place]) 11/09/2019  ? Osteomyelitis of ankle or foot, acute, right ([place]) 11/09/2019  ? Diabetic foot infection ([place]) 07/29/2016  ? ?Past Surgical History:  ?Procedure Laterality Date  ? AMPUTATION Right 07/31/2016  ? Procedure: AMPUTATION RIGHT FIFTH RAY AND APPLICATION OF WOUND VAC;  Surgeon: Dorna Leitz, MD;  Location: [facility];  Service: Orthopedics;  Laterality: Right;  ? AMPUTATION Right 08/08/2016  ? Procedure: RAY AMPUTATION RIGHT FOURTH METATARSAL, DEBRIDEMENT OF SUBQ TISSUE,BONE AND SKIN  WITH WOUND CLOSURE;  Surgeon: Dorna Leitz, MD;  Location: [facility];  Service: Orthopedics;  Laterality: Right;  ? AMPUTATION TOE Left 06/21/2019  ? Procedure: AMPUTATION TOE SECOND LEFT, DEBRIDEMENT OF LEFT GREAT TOE;  Surgeon: [name], DPM;  Location: [facility];  Service: Podiatry;  Laterality: Left;  ? AMPUTATION TOE Right 09/03/2020  ? Procedure: AMPUTATION TOE;  Surgeon: [name], DPM;  [facility]: [facility];  Service: Podiatry;  Laterality: Right;  ? ANAL FISSURE REPAIR  2000  ? APPLICATION OF A-CELL OF EXTREMITY Right 08/13/2016  ? Procedure: APPLICATION  OF A-CELL AND VAC;  Surgeon: Wallace Going, DO;  [facility]: [facility];  Service: Plastics;  Laterality: Right;  ? APPLICATION OF A-CELL OF EXTREMITY Right 07/16/2017  ? Procedure: CELLERATE PLACEMENT;  Surgeon: Wallace Going, DO;  Location: [facility];  Service: Plastics;  Laterality: Right;  ? APPLICATION OF WOUND VAC Right 09/18/2016  ? Procedure: APPLICATION OF WOUND VAC (PT. WILL BRING HIS OWN);  Surgeon: Wallace Going, DO;  Location: [facility];  Service: Plastics;  Laterality: Right;  ? APPLICATION OF WOUND VAC Right 10/30/2016  ? Procedure: APPLICATION OF WOUND VAC;  Surgeon: Wallace Going, DO;  Location: [facility];  Service: Plastics;  Laterality: Right;  ? APPLICATION OF WOUND VAC Right 12/01/2016  ? Procedure: APPLICATION OF WOUND VAC;  Surgeon: Wallace Going, DO;  [facility];  Service: Plastics;  Laterality: Right;  ? BONE BIOPSY Left 01/04/2018  ? Procedure: LEFT FOOT BONE BIOPSY;  Surgeon: [name], DPM;  Location: [facility];  Service: Podiatry;  Laterality: Left;  ? BONE BIOPSY Right 09/03/2020  ? Procedure: BONE BIOPSY;  Surgeon: [name], DPM;  [facility];  Service: Podiatry;  Laterality: Right;  ? CIRCUMCISION  2006  ? COLONOSCOPY  04/08/2021  ? DEBRIDEMENT  FOOT Right 07/16/2017  ? I & D EXTREMITY Right 09/18/2016  ? Procedure: IRRIGATION AND DEBRIDEMENT OF RIGHT LATERAL DIABLTIC FOOT ULCER;  Surgeon: Wallace Going, DO;  Location: [facility];  Service: Plastics;  Laterality: Right;  ? I & D EXTREMITY Right 03/11/2017  ? Procedure: IRRIGATION AND DEBRIDEMENT OF RIGHT LATERAL FOOT WOUND WITH CELLERATE COLLAGEN;  Surgeon: Wallace Going, DO;  Location: [facility];  Service: Plastics;  Laterality: Right;  ? INCISION AND DRAINAGE OF WOUND Right 08/13/2016  ? Procedure: IRRIGATION AND DEBRIDEMENT WOUND RIGHT FOOT;  Surgeon: Wallace Going, DO;   [facility];  Service: Plastics;  Laterality: Right;  ? IR PATIENT EVAL TECH 0-60 MINS  01/12/2020  ? LAPAROSCOPIC CHOLECYSTECTOMY  09-09-2006   dr Zella Richer  ? LOWER EXTREMITY ANGIOGRAPHY N/A 10/27/2016  ? Procedure: Lower Extremity Angiography;  Surgeon: Angelia Mould, MD;  Location: [facility];  Service: Cardiovascular;  Laterality: N/A;  ? MASS EXCISION Right 10/30/2016  ? Procedure: EXCISION RIGHT FOOT WOUND WITH VAC PLACEMENT;  Surgeon: Wallace Going, DO;  Location: [facility];  Service: Plastics;  Laterality: Right;  ? MASS EXCISION Right 07/16/2017  ? Procedure: EXCISION OF RIGHT FOOT WOUND WITH CELLERATE PLACEMENT;  Surgeon: Wallace Going, DO;  Location: [facility];  Service: Plastics;  Laterality: Right;  ? METATARSAL HEAD EXCISION Left 01/04/2018  ? Procedure: 5TH METATARSAL RESECTION,  WOUND CLOSURE,  Adjacent Tissue Transfer.;  Surgeon: [name], DPM;  Location: [facility];  Service: Podiatry;  Laterality: Left;  ?  SKIN SPLIT GRAFT Right 12/01/2016  ? Procedure: SKIN GRAFT SPLIT THICKNESS TO RIGHT LATERAL FOOT WOUND 9 X 3 CM;  Surgeon: Wallace Going, DO;  [facility];  Service: Plastics;  Laterality: Right;  ? wisdom teeth extracted  1984  ? WOUND DEBRIDEMENT Right 09/03/2020  ? Procedure: DEBRIDEMENT WOUND, INSERTION OF ANTIBIOTIC BEADS;  Surgeon: [name], DPM;  [facility];  Service: Podiatry;  Laterality: Right;  ? ? ?Family History  ?Problem Relation Age of Onset  ? Sarcoidosis Mother   ? Stroke Father   ?     early 56s. former smoker  ? Diabetes Father   ? Hypertension Father   ? Hypertension Sister   ? Diabetes Paternal Grandmother   ? Congenital heart disease Paternal Grandmother   ?     died of complications  ? Healthy Daughter   ? Colon cancer Neg Hx   ? Colon polyps Neg Hx   ? Esophageal cancer Neg Hx   ? Rectal cancer Neg Hx   ? Stomach cancer Neg Hx   ? ? ?Medications- reviewed and  updated ?Current Outpatient Medications  ?Medication Sig Dispense Refill  ? ascorbic acid (VITAMIN C) 500 MG tablet Take 500 mg by mouth daily.    ? aspirin EC 81 MG tablet Take 81 mg by mouth daily.    ? atorvastatin (LIPITOR) 40 MG t

## 2021-04-17 NOTE — Progress Notes (Signed)
?  Subjective:  ?Patient ID: [name], male    DOB: 03/01/1970,  MRN: [id number] ? ?Chief Complaint  ?Patient presents with  ? Wound Check  ? ?Date of Surgery: 09/03/20 ?Procedures: ?            1) Right foot distal phalanx great toe bone biopsy ?            2) Debridement of wound right foot - to bone ?            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            4) Insertion of abx beads ? ?51 y.o. male presents with the above complaint. History confirmed with patient. Reports very little drainage. States it is painful after walking too much.  Continue Betadine wet-to-dry dressing.  He is known to Dr. March Rummage and had listed above procedure done. ?Objective:  ?Physical Exam: no tenderness at the surgical site, local edema noted, and calf supple, nontender.  Nonpalpable DP PT pulses bilaterally ?Incision: Right plantar foot wound probing down to deep tissue.  No malodor present no erythema noted.  No purulent drainage was expressed. ? ?Assessment:  ? ?1. Diabetic ulcer of toe of right foot associated with diabetes mellitus due to underlying condition, with fat layer exposed ([place])   ? ? ? ? ?Plan:  ?Patient was evaluated and treated and all questions answered. ? ?Right foot ulcer with probing down to deep tissue ?-Wound a little worse, but well appearing. Continue iodosorb. Dressed as such today. Minimally debrided. -I will place him on doxycycline for next 30 days.  I encouraged him to do Betadine wet-to-dry dressing changes. ?-I will place an order for ABIs PVRs to assess the vascular flow to the lower extremity given that the wound is pretty stagnant and the last one was done about 6 months ago if not more. ? ?No follow-ups on file.  ? ? ?

## 2021-04-29 ENCOUNTER — Ambulatory Visit (HOSPITAL_COMMUNITY)
Admission: RE | Admit: 2021-04-29 | Discharge: 2021-04-29 | Disposition: A | Discharge status: Home / Self Care | Payer: BLUE CROSS/BLUE SHIELD | Source: Ambulatory Visit | Attending: Podiatry | Admitting: Podiatry

## 2021-04-29 DIAGNOSIS — I999 Unspecified disorder of circulatory system: Secondary | ICD-10-CM | POA: Diagnosis present

## 2021-05-09 ENCOUNTER — Ambulatory Visit: Payer: BLUE CROSS/BLUE SHIELD

## 2021-05-09 DIAGNOSIS — M14671 Charcot's joint, right ankle and foot: Secondary | ICD-10-CM

## 2021-05-09 NOTE — Progress Notes (Signed)
SITUATION ?Reason for Consult: Follow-up with custom foot orthotics ?Patient / Caregiver Report: Patient requires additional offloading for wound ? ?OBJECTIVE DATA ?History / Diagnosis:  ?  ICD-10-CM   ?1. Charcot's joint of foot, right  M14.671   ?  ? ? ?Change in Pathology: Wound on right lateral aspect of foot ? ?ACTIONS PERFORMED ?Patient's equipment was checked for structural stability and fit. Modified offloading at wound site. Device(s) intact and fit is excellent. All questions answered and concerns addressed. ? ?PLAN ?Follow-up as needed (PRN). Plan of care discussed with and agreed upon by patient / caregiver. ? ?

## 2021-05-10 ENCOUNTER — Ambulatory Visit: Payer: BLUE CROSS/BLUE SHIELD | Admitting: Podiatry

## 2021-05-10 DIAGNOSIS — E08621 Diabetes mellitus due to underlying condition with foot ulcer: Secondary | ICD-10-CM

## 2021-05-10 DIAGNOSIS — L97512 Non-pressure chronic ulcer of other part of right foot with fat layer exposed: Secondary | ICD-10-CM | POA: Diagnosis not present

## 2021-05-10 DIAGNOSIS — M14671 Charcot's joint, right ankle and foot: Secondary | ICD-10-CM

## 2021-05-10 NOTE — Progress Notes (Signed)
?  Subjective:  ?Patient ID: [name], male    DOB: 1970-04-07,  MRN: [id number] ? ?Chief Complaint  ?Patient presents with  ? Wound Check  ?  Wound care  ?Pt stated that he has no new concerns   ? ?Date of Surgery: 09/03/20 ?Procedures: ?            1) Right foot distal phalanx great toe bone biopsy ?            2) Debridement of wound right foot - to bone ?            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            4) Insertion of abx beads ? ?51 y.o. male presents with the above complaint. History confirmed with patient. Reports very little drainage. States it is painful after walking too much.  Continue Betadine wet-to-dry dressing.  ?Objective:  ?Physical Exam: no tenderness at the surgical site, local edema noted, and calf supple, nontender.  Nonpalpable DP PT pulses bilaterally ?Incision: Right plantar foot wound probing down subcutaneous tissue no malodor present no erythema noted.  No purulent drainage was expressed. ? ?Assessment:  ? ?No diagnosis found. ? ? ? ? ?Plan:  ?Patient was evaluated and treated and all questions answered. ? ?Right foot ulcer with probing down to deep tissue ?-Wound a little worse, but well appearing. Continue iodosorb. Dressed as such today. Minimally debrided.  ?-Continue doxycycline ?-Continue Betadine wet-to-dry dressing ?-ABIs PVRs were reviewed with the patient in extensive detail and shows adequate flow to the both lower extremity ?-I have placed himself in the [place] walker that he has at home to see if this would offload it. ? ?No follow-ups on file.  ? ? ?

## 2021-05-15 ENCOUNTER — Encounter: Payer: Self-pay | Admitting: Family Medicine

## 2021-05-15 ENCOUNTER — Ambulatory Visit (INDEPENDENT_AMBULATORY_CARE_PROVIDER_SITE_OTHER): Payer: BLUE CROSS/BLUE SHIELD | Admitting: Family Medicine

## 2021-05-15 VITALS — BP 130/72 | HR 80 | Temp 98.5°F | Ht 73.0 in | Wt 310.4 lb

## 2021-05-15 DIAGNOSIS — Z125 Encounter for screening for malignant neoplasm of prostate: Secondary | ICD-10-CM

## 2021-05-15 DIAGNOSIS — Z89421 Acquired absence of other right toe(s): Secondary | ICD-10-CM

## 2021-05-15 DIAGNOSIS — E538 Deficiency of other specified B group vitamins: Secondary | ICD-10-CM

## 2021-05-15 DIAGNOSIS — I10 Essential (primary) hypertension: Secondary | ICD-10-CM | POA: Diagnosis not present

## 2021-05-15 DIAGNOSIS — E1169 Type 2 diabetes mellitus with other specified complication: Secondary | ICD-10-CM

## 2021-05-15 DIAGNOSIS — E11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ical complication: Secondary | ICD-10-CM | POA: Diagnosis not present

## 2021-05-15 DIAGNOSIS — E785 Hyperlipidemia, unspecified: Secondary | ICD-10-CM

## 2021-05-15 DIAGNOSIS — Z0000 Encounter for general adult medical examination without abnormal findings: Secondary | ICD-10-CM

## 2021-05-15 DIAGNOSIS — N183 Chronic kidney disease, stage 3 unspecified: Secondary | ICD-10-CM

## 2021-05-15 LAB — CBC WITH DIFFERENTIAL/PLATELET
Basophils Absolute: 0 10*3/uL (ref 0.0–0.1)
Basophils Relative: 0.3 % (ref 0.0–3.0)
Eosinophils Absolute: 0.2 10*3/uL (ref 0.0–0.7)
Eosinophils Relative: 2.1 % (ref 0.0–5.0)
HCT: 36.7 % — ABNORMAL LOW (ref 39.0–52.0)
Hemoglobin: 12.1 g/dL — ABNORMAL LOW (ref 13.0–17.0)
Lymphocytes Relative: 23.5 % (ref 12.0–46.0)
Lymphs Abs: 2.2 10*3/uL (ref 0.7–4.0)
MCHC: 33 g/dL (ref 30.0–36.0)
MCV: 78.3 fl (ref 78.0–100.0)
Monocytes Absolute: 0.5 10*3/uL (ref 0.1–1.0)
Monocytes Relative: 5.4 % (ref 3.0–12.0)
Neutro Abs: 6.5 10*3/uL (ref 1.4–7.7)
Neutrophils Relative %: 68.7 % (ref 43.0–77.0)
Platelets: 377 10*3/uL (ref 150.0–400.0)
RBC: 4.68 Mil/uL (ref 4.22–5.81)
RDW: 14.4 % (ref 11.5–15.5)
WBC: 9.4 10*3/uL (ref 4.0–10.5)

## 2021-05-15 LAB — COMPREHENSIVE METABOLIC PANEL
ALT: 24 U/L (ref 0–53)
AST: 18 U/L (ref 0–37)
Albumin: 4 g/dL (ref 3.5–5.2)
Alkaline Phosphatase: 84 U/L (ref 39–117)
BUN: 22 mg/dL (ref 6–23)
CO2: 30 mEq/L (ref 19–32)
Calcium: 9.8 mg/dL (ref 8.4–10.5)
Chloride: 103 mEq/L (ref 96–112)
Creatinine, Ser: 1.37 mg/dL (ref 0.40–1.50)
GFR: 59.9 mL/min — ABNORMAL LOW (ref >60.00)
Glucose, Bld: 69 mg/dL — ABNORMAL LOW (ref 70–99)
Potassium: 4.5 mEq/L (ref 3.5–5.1)
Sodium: 140 mEq/L (ref 135–145)
Total Bilirubin: 0.7 mg/dL (ref 0.2–1.2)
Total Protein: 8.3 g/dL (ref 6.0–8.3)

## 2021-05-15 LAB — VITAMIN B12: Vitamin B-12: >1504 pg/mL — ABNORMAL HIGH (ref 211–911)

## 2021-05-15 LAB — LIPID PANEL
Cholesterol: 169 mg/dL (ref 0–200)
HDL: 41.8 mg/dL (ref >39.00)
LDL Cholesterol: 111 mg/dL — ABNORMAL HIGH (ref 0–99)
NonHDL: 126.91
Total CHOL/HDL Ratio: 4
Triglycerides: 79 mg/dL (ref 0.0–149.0)
VLDL: 15.8 mg/dL (ref 0.0–40.0)

## 2021-05-15 LAB — PSA: PSA: 0.63 ng/mL (ref 0.10–4.00)

## 2021-05-15 LAB — HEMOGLOBIN A1C: Hgb A1c MFr Bld: 6.8 % — ABNORMAL HIGH (ref 4.6–6.5)

## 2021-05-15 MED ORDER — INSULIN ASPART 100 UNIT/ML FLEXPEN: 15.0000 [IU] | PEN_INJECTOR | Freq: Three times a day (TID) | SUBCUTANEOUS | 3 refills | Status: DC

## 2021-05-15 NOTE — Patient Instructions (Addendum)
Please stop by lab before you go ?If you have mychart- we will send your results within 3 business days of [place] receiving them.  ?If you do not have mychart- we will call you about results within 5 business days of [place] receiving them.  ?*please also note that you will see labs on mychart as soon as they post. I will later go in and write notes on them- will say "notes from Dr. Yong Channel"  ? ?Try novolog flex pens if cost effective ? ?Could also consider flexpen for long acting insulin - possible would need 2 shots though ? ?Also could consider freestyle libre in future if you were interested- continuous monitoring ? ?Recommended follow up: Return in about 6 months (around 11/14/2021) for followup or sooner if needed.Schedule b4 you leave. ?

## 2021-05-16 ENCOUNTER — Other Ambulatory Visit: Payer: Self-pay

## 2021-05-16 MED ORDER — ATORVASTATIN CALCIUM 80 MG PO TABS: 80.0000 mg | ORAL_TABLET | Freq: Every day | ORAL | 3 refills | Status: DC

## 2021-05-24 ENCOUNTER — Other Ambulatory Visit: Payer: Self-pay

## 2021-05-24 MED ORDER — "INSULIN SYRINGE-NEEDLE U-100 31G X 5/16"" 0.5 ML MISC": 4 refills | Status: DC

## 2021-05-28 ENCOUNTER — Ambulatory Visit: Payer: BLUE CROSS/BLUE SHIELD | Admitting: Podiatry

## 2021-05-28 DIAGNOSIS — L97512 Non-pressure chronic ulcer of other part of right foot with fat layer exposed: Secondary | ICD-10-CM | POA: Diagnosis not present

## 2021-05-28 DIAGNOSIS — E08621 Diabetes mellitus due to underlying condition with foot ulcer: Secondary | ICD-10-CM

## 2021-05-28 DIAGNOSIS — M14671 Charcot's joint, right ankle and foot: Secondary | ICD-10-CM

## 2021-05-28 NOTE — Progress Notes (Signed)
?  Subjective:  ?Patient ID: [name], male    DOB: 1970-04-02,  MRN: [id number] ? ?Chief Complaint  ?Patient presents with  ? Callouses  ? ?Date of Surgery: 09/03/20 ?Procedures: ?            1) Right foot distal phalanx great toe bone biopsy ?            2) Debridement of wound right foot - to bone ?            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            4) Insertion of abx beads ? ?51 y.o. male presents with the above complaint. History confirmed with patient. Reports very little drainage. States it is painful after walking too much.  Continue Betadine wet-to-dry dressing.  ?Objective:  ?Physical Exam: no tenderness at the surgical site, local edema noted, and calf supple, nontender.  Nonpalpable DP PT pulses bilaterally ?Incision: Right plantar foot wound probing down subcutaneous tissue no malodor present no erythema noted.  No purulent drainage was expressed. ? ?Assessment:  ? ?1. Charcot's joint of foot, right   ?2. Diabetic ulcer of toe of right foot associated with diabetes mellitus due to underlying condition, with fat layer exposed ([place])   ? ? ? ? ? ?Plan:  ?Patient was evaluated and treated and all questions answered. ? ?Right foot ulcer with probing down to deep tissue ?-Wound is slightly improving with [place]inda [place] boot walker immobilization.  We will continue to do that.  Minimally debrided today ?-Continue doxycycline ?-Continue Betadine wet-to-dry dressing ?-ABIs PVRs were reviewed with the patient in extensive detail and shows adequate flow to the both lower extremity ?-I have placed himself in the [place] walker that he has at home to see if this would offload it. ? ?No follow-ups on file.  ? ? ?

## 2021-06-14 ENCOUNTER — Ambulatory Visit: Payer: BLUE CROSS/BLUE SHIELD | Admitting: Podiatry

## 2021-06-21 ENCOUNTER — Telehealth: Payer: Self-pay | Admitting: Family Medicine

## 2021-06-21 MED ORDER — "INSULIN SYRINGE-NEEDLE U-100 31G X 5/16"" 1 ML MISC": 3 refills | Status: DC

## 2021-06-21 NOTE — Telephone Encounter (Signed)
Rx sent to pharmacy   

## 2021-06-21 NOTE — Telephone Encounter (Signed)
.   Encourage patient to contact the pharmacy for refills or they can request refills through [place]:  05/15/21  NEXT APPOINTMENT DATE: 08/01/21  MEDICATION:Insulin Syringe-Needle U-100 (BD INSULIN SYRINGE U/F) 31G X 5/16" 1 ML   Is the patient out of medication? YES  PHARMACY: [id number]:  [phone] Fax:  [phone]     Let patient know to contact pharmacy at the end of the day to make sure medication is ready.  Please notify patient to allow 48-72 hours to process

## 2021-06-24 MED ORDER — "INSULIN SYRINGE-NEEDLE U-100 31G X 5/16"" 1 ML MISC": 3 refills | Status: DC

## 2021-06-24 NOTE — Telephone Encounter (Signed)
Below rx resent to pharmacy, spoke with pt wife and she states this is the correct rx, however [facility] is giving them the wrong item.

## 2021-06-24 NOTE — Addendum Note (Signed)
Addended by: Clyde Lundborg A on: 06/24/2021 09:43 AM   Modules accepted: Orders

## 2021-06-24 NOTE — Telephone Encounter (Signed)
Pt's wife states the wrong rx was sent in. She is asking if this can be called in today due to being out. Please advise  Insulin Syringe-Needle U-100 (BD INSULIN SYRINGE U/F) 31G X 5/16" 1 ML

## 2021-06-26 ENCOUNTER — Ambulatory Visit: Payer: BLUE CROSS/BLUE SHIELD | Admitting: Podiatry

## 2021-06-26 DIAGNOSIS — E11621 Type 2 diabetes mellitus with foot ulcer: Secondary | ICD-10-CM | POA: Diagnosis not present

## 2021-06-26 DIAGNOSIS — L97411 Non-pressure chronic ulcer of right heel and midfoot limited to breakdown of skin: Secondary | ICD-10-CM

## 2021-07-02 NOTE — Progress Notes (Signed)
  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 male presents with the above complaint. History confirmed with patient. Reports very little drainage.  He states is doing okay.  Pain is subsided.  He has been doing Betadine wet-to-dry dressing change and has been stagnant. Objective:  Physical Exam: no tenderness at the surgical site, local edema noted, and calf supple, nontender.  Nonpalpable DP PT pulses bilaterally Incision: Right plantar foot wound probing down subcutaneous tissue no malodor present no erythema noted.  No purulent drainage was expressed.  Assessment:   No diagnosis found.      Plan:  Patient was evaluated and treated and all questions answered.  Right foot ulcer with probing down to deep tissue -Wound is slightly improving with Milinda Cave boot walker immobilization.  We will continue to do that.  Minimally debrided today -Continue doxycycline -Continue Betadine wet-to-dry dressing -ABIs PVRs were reviewed with the patient in extensive detail and shows adequate flow to the both lower extremity -Continue wearing Crow walker -I will order an MRI of the right foot given the stagnation of the wound to rule out any osteomyelitis -He will also benefit from follow-up with the wound care center for advanced wound care.  No follow-ups on file.

## 2021-07-03 ENCOUNTER — Telehealth: Payer: Self-pay | Admitting: *Deleted

## 2021-07-03 NOTE — Telephone Encounter (Signed)
-----   Message from Felipa Furnace, DPM sent at 07/02/2021  8:43 AM EDT ----- Hi Heinrich Fertig,  Can you refer this patient to the wound care center that was allowed.  The orders in

## 2021-07-04 ENCOUNTER — Telehealth: Payer: Self-pay | Admitting: *Deleted

## 2021-07-04 ENCOUNTER — Encounter (HOSPITAL_BASED_OUTPATIENT_CLINIC_OR_DEPARTMENT_OTHER): Payer: BLUE CROSS/BLUE SHIELD | Attending: General Surgery | Admitting: General Surgery

## 2021-07-04 DIAGNOSIS — E11621 Type 2 diabetes mellitus with foot ulcer: Secondary | ICD-10-CM | POA: Insufficient documentation

## 2021-07-04 DIAGNOSIS — M86671 Other chronic osteomyelitis, right ankle and foot: Secondary | ICD-10-CM | POA: Diagnosis not present

## 2021-07-04 DIAGNOSIS — L97514 Non-pressure chronic ulcer of other part of right foot with necrosis of bone: Secondary | ICD-10-CM | POA: Diagnosis not present

## 2021-07-04 DIAGNOSIS — Z89421 Acquired absence of other right toe(s): Secondary | ICD-10-CM | POA: Diagnosis not present

## 2021-07-04 DIAGNOSIS — E11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ical complication: Secondary | ICD-10-CM | POA: Insufficient documentation

## 2021-07-04 NOTE — Telephone Encounter (Signed)
Faxed wound care referral to [facility]- 07/04/21,they will contact patient to schedule

## 2021-07-04 NOTE — Progress Notes (Signed)
[name], Rhyder M. ([id number]) Visit Report for 07/04/2021 Abuse Risk Screen Details Patient Name: Date of Service: [name], [name] 07/04/2021 8:00 A M Medical Record Number: [id number] Patient Account Number: [id number] Date of Birth/Sex: Treating RN: 1970/08/09 (51 y.o. [name] Primary Care Kvion Shapley: [name] Other Clinician: Referring Aoi Kouns: Treating Tawan Degroote/Extender: Elson Areas in Treatment: 0 Abuse Risk Screen Items Answer ABUSE RISK SCREEN: Has anyone close to you tried to hurt or harm you recentlyo No Do you feel uncomfortable with anyone in your familyo No Has anyone forced you do things that you didnt want to doo No Electronic Signature(s) Signed: 07/04/2021 6:39:17 PM By: Dellie Catholic RN Entered By: Dellie Catholic on 07/04/2021 08:18:50 -------------------------------------------------------------------------------- Activities of Daily Living Details Patient Name: Date of Service: [place], [name] 07/04/2021 8:00 A M Medical Record Number: [id number] Patient Account Number: [id number] Date of Birth/Sex: Treating RN: 1970/11/01 (51 y.o. [name] Primary Care [place] Antolin: [name] Other Clinician: Referring Analaura Messler: Treating Kween Bacorn/Extender: Elson Areas in Treatment: 0 Activities of Daily Living Items Answer Activities of Daily Living (Please select one for each item) Drive Automobile Completely Able T Medications ake Completely Able Use T elephone Completely Able Care for Appearance Completely Able Use T oilet Completely Able Bath / Shower Completely Able Dress Self Completely Able Feed Self Completely Able Walk Completely Able Get In / Out Bed Completely Able Housework Completely Able Prepare Meals Completely [place] for Self Completely Able Electronic Signature(s) Signed: 07/04/2021 6:39:17 PM By: Dellie Catholic RN Entered By: Dellie Catholic on 07/04/2021 08:19:27 -------------------------------------------------------------------------------- Education Screening Details Patient Name: Date of Service: [place], [name] 07/04/2021 8:00 A M Medical Record Number: [id number] Patient Account Number: [id number] Date of Birth/Sex: Treating RN: 07/30/1970 (51 y.o. [name] Primary Care Cait Locust: [name] Other Clinician: Referring Patrcia Schnepp: Treating Asah Lamay/Extender: Elson Areas in Treatment: 0 Primary Learner Assessed: Patient Learning Preferences/Education Level/Primary Language Learning Preference: Explanation, Demonstration, Printed Material Highest Education Level: College or Above Preferred Language: English Cognitive Barrier Language Barrier: No Translator Needed: No Memory Deficit: No Emotional Barrier: No Cultural/Religious Beliefs Affecting Medical Care: No Physical Barrier Impaired Vision: No Impaired Hearing: No Decreased Hand dexterity: No Knowledge/Comprehension Knowledge Level: High Comprehension Level: High Ability to understand written instructions: High Ability to understand verbal instructions: High Motivation Anxiety Level: Calm Cooperation: Cooperative Education Importance: Acknowledges Need Interest in Health Problems: Asks Questions Perception: Coherent Willingness to Engage in Self-Management High Activities: Readiness to Engage in Self-Management High Activities: Electronic Signature(s) Signed: 07/04/2021 6:39:17 PM By: Dellie Catholic RN Entered By: Dellie Catholic on 07/04/2021 08:20:34 -------------------------------------------------------------------------------- Fall Risk Assessment Details Patient Name: Date of Service: [name], [name] 07/04/2021 8:00 A M Medical Record Number: [id number] Patient Account Number: [id number] Date of Birth/Sex: Treating RN: [birth year] (51 y.o. [name] Primary Care De Libman: [name] Other Clinician: Referring Ziyan Hillmer: Treating Stefanny Pieri/Extender: Elson Areas in Treatment: 0 Fall Risk Assessment Items Have you had 2 or more falls in the last 12 monthso 0 No Have you had any fall that resulted in injury in the last 12 monthso 0 No FALLS RISK SCREEN History of falling - immediate or within 3 months 0 No Secondary diagnosis (Do you have 2 or more medical diagnoseso) 0 No Ambulatory aid None/bed rest/wheelchair/nurse 0 No Crutches/cane/walker 0 No Furniture 0 No Intravenous therapy Access/Saline/Heparin Lock 0 No Gait/Transferring Normal/ bed rest/ wheelchair  0 No Weak (short steps with or without shuffle, stooped but able to lift head while walking, may seek 0 No support from furniture) Impaired (short steps with shuffle, may have difficulty arising from chair, head down, impaired 0 No balance) Mental Status Oriented to own ability 0 No Electronic Signature(s) Signed: 07/04/2021 6:39:17 PM By: Dellie Catholic RN Entered By: Dellie Catholic on 07/04/2021 08:20:40 -------------------------------------------------------------------------------- Foot Assessment Details Patient Name: Date of Service: [name], [name] 07/04/2021 8:00 A M Medical Record Number: [id number] Patient Account Number: [id number] Date of Birth/Sex: Treating RN: 01/23/1970 (51 y.o. [name] Primary Care Arrayah Connors: [name] Other Clinician: Referring Malaya Cagley: Treating Jozette Castrellon/Extender: Elson Areas in Treatment: 0 Foot Assessment Items Site Locations + = Sensation present, - = Sensation absent, C = Callus, U = Ulcer R = Redness, W = Warmth, M = Maceration, PU = Pre-ulcerative lesion F = Fissure, S = Swelling, D = Dryness Assessment Right: Left: Other Deformity: No No Prior Foot Ulcer: No No Prior Amputation: No No Charcot Joint: No No Ambulatory Status: Gait: Notes Right foot 3rd,4th,5th toes  amputated Electronic Signature(s) Signed: 07/04/2021 6:39:17 PM By: Dellie Catholic RN Entered By: Dellie Catholic on 07/04/2021 08:34:09 -------------------------------------------------------------------------------- Nutrition Risk Screening Details Patient Name: Date of Service: [place], [name] 07/04/2021 8:00 A M Medical Record Number: [id number] Patient Account Number: [id number] Date of Birth/Sex: Treating RN: [birth year] (51 y.o. [name] Primary Care Bronco Mcgrory: [name] Other Clinician: Referring Jack Bolio: Treating Laney Bagshaw/Extender: Elson Areas in Treatment: 0 Height (in): 72 Weight (lbs): 312 Body Mass Index (BMI): 42.3 Nutrition Risk Screening Items Score Screening NUTRITION RISK SCREEN: I have an illness or condition that made me change the kind and/or amount of food I eat 0 No I eat fewer than two meals per day 0 No I eat few fruits and vegetables, or milk products 0 No I have three or more drinks of beer, liquor or wine almost every day 0 No I have tooth or mouth problems that make it hard for me to eat 0 No I don't always have enough money to buy the food I need 0 No I eat alone most of the time 0 No I take three or more different prescribed or over-the-counter drugs a day 0 No Without wanting to, I have lost or gained 10 pounds in the last six months 0 No I am not always physically able to shop, cook and/or feed myself 0 No Nutrition Protocols Good Risk Protocol 0 No interventions needed Moderate Risk Protocol High Risk Proctocol Risk Level: Good Risk Score: 0 Electronic Signature(s) Signed: 07/04/2021 6:39:17 PM By: Dellie Catholic RN Entered By: Dellie Catholic on 07/04/2021 08:20:53

## 2021-07-04 NOTE — Telephone Encounter (Signed)
-----   Message from Felipa Furnace, DPM sent at 07/02/2021  8:43 AM EDT ----- Hi [place] Cloe,  Can you refer this patient to the wound care center that was allowed.  The orders in

## 2021-07-04 NOTE — Progress Notes (Signed)
[name], Sartaj M. ([id number]) Visit Report for 07/04/2021 Allergy List Details Patient Name: Date of Service: [name], [name] 07/04/2021 8:00 A M Medical Record Number: [id number] Patient Account Number: [id number] Date of Birth/Sex: Treating RN: 1970/10/03 (51 y.o. [name] Primary Care Corinda Ammon: [name] Other Clinician: Referring Genee Rann: Treating Amarrion Pastorino/Extender: Elson Areas in Treatment: 0 Allergies Active Allergies No Known Allergies Allergy Notes Electronic Signature(s) Signed: 07/04/2021 6:39:17 PM By: Dellie Catholic RN Entered By: Dellie Catholic on 07/04/2021 08:05:20 -------------------------------------------------------------------------------- Arrival Information Details Patient Name: Date of Service: [name], [name] 07/04/2021 8:00 A M Medical Record Number: [id number] Patient Account Number: [id number] Date of Birth/Sex: Treating RN: 04/27/1970 (51 y.o. [name] Primary Care Rip Hawes: [name] Other Clinician: Referring Olympia Adelsberger: Treating Durelle Zepeda/Extender: Elson Areas in Treatment: 0 Visit Information Patient Arrived: Ambulatory Arrival Time: 07:57 Accompanied By: self Transfer Assistance: None Patient Identification Verified: Yes Patient Has Alerts: Yes Patient Alerts: ABI R 1.17 ABI L 1.29 R TBI 1.03 L TBI 0.77 Electronic Signature(s) Signed: 07/04/2021 6:39:17 PM By: Dellie Catholic RN Entered By: Dellie Catholic on 07/04/2021 08:57:23 -------------------------------------------------------------------------------- Clinic Level of Care Assessment Details Patient Name: Date of Service: [place], [name] 07/04/2021 8:00 A M Medical Record Number: [id number] Patient Account Number: [id number] Date of Birth/Sex: Treating RN: 04-05-1970 (51 y.o. [name] Primary Care Audrey Thull: [name] Other Clinician: Referring Dietrich Samuelson: Treating Efrat Zuidema/Extender:  Elson Areas in Treatment: 0 Clinic Level of Care Assessment Items TOOL 1 Quantity Score X- 1 0 Use when EandM and Procedure is performed on INITIAL visit ASSESSMENTS - Nursing Assessment / Reassessment X- 1 20 General Physical Exam (combine w/ comprehensive assessment (listed just below) when performed on new pt. evals) X- 1 25 Comprehensive Assessment (HX, ROS, Risk Assessments, Wounds Hx, etc.) ASSESSMENTS - Wound and Skin Assessment / Reassessment '[]'$  - 0 Dermatologic / Skin Assessment (not related to wound area) ASSESSMENTS - Ostomy and/or Continence Assessment and Care '[]'$  - 0 Incontinence Assessment and Management '[]'$  - 0 Ostomy Care Assessment and Management (repouching, etc.) PROCESS - Coordination of Care X - Simple Patient / Family Education for ongoing care 1 15 X- 1 20 Complex (extensive) Patient / Family Education for ongoing care X- 1 10 Staff obtains Programmer, systems, Records, T Results / Process Orders est '[]'$  - 0 Staff telephones HHA, Nursing Homes / Clarify orders / etc '[]'$  - 0 Routine Transfer to another Facility (non-emergent condition) '[]'$  - 0 Routine Hospital Admission (non-emergent condition) X- 1 15 New Admissions / Biomedical engineer / Ordering NPWT Apligraf, etc. , '[]'$  - 0 Emergency Hospital Admission (emergent condition) PROCESS - Special Needs '[]'$  - 0 Pediatric / Minor Patient Management '[]'$  - 0 Isolation Patient Management '[]'$  - 0 Hearing / Language / Visual special needs '[]'$  - 0 Assessment of Community assistance (transportation, D/C planning, etc.) '[]'$  - 0 Additional assistance / Altered mentation '[]'$  - 0 Support Surface(s) Assessment (bed, cushion, seat, etc.) INTERVENTIONS - Miscellaneous '[]'$  - 0 External ear exam '[]'$  - 0 Patient Transfer (multiple staff / Civil Service fast streamer / Similar devices) '[]'$  - 0 Simple Staple / Suture removal (25 or less) '[]'$  - 0 Complex Staple / Suture removal (26 or more) '[]'$  - 0 Hypo/Hyperglycemic  Management (do not check if billed separately) '[]'$  - 0 Ankle / Brachial Index (ABI) - do not check if billed separately Has the patient been seen at the hospital within the last three years: Yes Total  Score: 105 Level Of Care: New/Established - Level 3 Electronic Signature(s) Signed: 07/04/2021 6:39:17 PM By: Dellie Catholic RN Entered By: Dellie Catholic on 07/04/2021 18:37:20 -------------------------------------------------------------------------------- Encounter Discharge Information Details Patient Name: Date of Service: [name], [name] 07/04/2021 8:00 A M Medical Record Number: [id number] Patient Account Number: [id number] Date of Birth/Sex: Treating RN: [birth year] (51 y.o. [name] Primary Care Purvis Sidle: [name] Other Clinician: Referring Alejandria Wessells: Treating Raylynne Cubbage/Extender: Elson Areas in Treatment: 0 Encounter Discharge Information Items Post Procedure Vitals Discharge Condition: Stable Temperature (F): 98.5 Ambulatory Status: Ambulatory Pulse (bpm): 77 Discharge Destination: Home Respiratory Rate (breaths/min): 16 Transportation: Private Auto Blood Pressure (mmHg): 162/92 Accompanied By: self Schedule Follow-up Appointment: Yes Clinical Summary of Care: Patient Declined Electronic Signature(s) Signed: 07/04/2021 6:39:17 PM By: Dellie Catholic RN Entered By: Dellie Catholic on 07/04/2021 18:38:49 -------------------------------------------------------------------------------- Lower Extremity Assessment Details Patient Name: Date of Service: [name], [name] 07/04/2021 8:00 A M Medical Record Number: [id number] Patient Account Number: [id number] Date of Birth/Sex: Treating RN: 1970/07/21 (51 y.o. [name] Primary Care Kenishia Plack: [name] Other Clinician: Referring Fallou Hulbert: Treating Chelli Yerkes/Extender: Elson Areas in Treatment: 0 Edema Assessment Assessed: [name]: No] [Right:  No] E[Left: dema] [Right: :] Calf Left: Right: Point of Measurement: From Medial Instep 40.9 cm Ankle Left: Right: Point of Measurement: From Medial Instep 24.1 cm Electronic Signature(s) Signed: 07/04/2021 6:39:17 PM By: Dellie Catholic RN Entered By: Dellie Catholic on 07/04/2021 08:35:30 -------------------------------------------------------------------------------- Multi Wound Chart Details Patient Name: Date of Service: [name], [name] 07/04/2021 8:00 A M Medical Record Number: [id number] Patient Account Number: [id number] Date of Birth/Sex: Treating RN: 10-Oct-1970 (51 y.o. [name] Primary Care Nathania Waldman: [name] Other Clinician: Referring Vernal Rutan: Treating Keanthony Poole/Extender: Elson Areas in Treatment: 0 Vital Signs Height(in): 72 Pulse(bpm): 77 Weight(lbs): 350 Blood Pressure(mmHg): 162/92 Body Mass Index(BMI): 42.3 Temperature(F): 98.5 Respiratory Rate(breaths/min): 16 Photos: [N/A:N/A] Right, Lateral, Plantar Foot N/A N/A Wound Location: Gradually Appeared N/A N/A Wounding Event: Diabetic Wound/Ulcer of the Lower N/A N/A Primary Etiology: Extremity Type II Diabetes N/A N/A Comorbid History: 12/19/2016 N/A N/A Date Acquired: 0 N/A N/A Weeks of Treatment: Open N/A N/A Wound Status: No N/A N/A Wound Recurrence: 1x4.6x5 N/A N/A Measurements L x W x D (cm) 3.613 N/A N/A A (cm) : rea 18.064 N/A N/A Volume (cm) : -513.40% N/A N/A % Reduction in A rea: -10105.60% N/A N/A % Reduction in Volume: Grade 2 N/A N/A Classification: Medium N/A N/A Exudate A mount: Serosanguineous N/A N/A Exudate Type: red, brown N/A N/A Exudate Color: None Present (0%) N/A N/A Granulation A mount: Large (67-100%) N/A N/A Necrotic A mount: Fat Layer (Subcutaneous Tissue): Yes N/A N/A Exposed Structures: Fascia: No Tendon: No Muscle: No Joint: No Bone: No None N/A N/A Epithelialization: Debridement - Excisional N/A  N/A Debridement: Pre-procedure Verification/Time Out 08:55 N/A N/A Taken: Other N/A N/A Pain Control: Callus, Subcutaneous, Slough N/A N/A Tissue Debrided: Skin/Subcutaneous Tissue N/A N/A Level: 4.6 N/A N/A Debridement A (sq cm): rea Curette N/A N/A Instrument: Moderate N/A N/A Bleeding: Silver Nitrate N/A N/A Hemostasis A chieved: 0 N/A N/A Procedural Pain: 0 N/A N/A Post Procedural Pain: Procedure was tolerated well N/A N/A Debridement Treatment Response: 1x4.6x5 N/A N/A Post Debridement Measurements L x W x D (cm) 18.064 N/A N/A Post Debridement Volume: (cm) Debridement N/A N/A Procedures Performed: Treatment Notes Electronic Signature(s) Signed: 07/04/2021 9:18:47 AM By: [name] MD FACS Signed: 07/04/2021 6:39:17 PM By: Minus Liberty,  Mechele Claude RN Entered By: [name] on 07/04/2021 09:18:46 -------------------------------------------------------------------------------- Multi-Disciplinary Care Plan Details Patient Name: Date of Service: [name] [name] 07/04/2021 8:00 A M Medical Record Number: [id number] Patient Account Number: [id number] Date of Birth/Sex: Treating RN: 1970-08-30 (51 y.o. [name] Primary Care Adriauna Campton: [name] Other Clinician: Referring Kortney Potvin: Treating Artemis Koller/Extender: Elson Areas in Treatment: 0 Active Inactive Wound/Skin Impairment Nursing Diagnoses: Impaired tissue integrity Goals: Patient/caregiver will verbalize understanding of skin care regimen Date Initiated: 07/04/2021 Target Resolution Date: 08/16/2021 Goal Status: Active Interventions: Assess ulceration(s) every visit Treatment Activities: Skin care regimen initiated : 07/04/2021 Notes: Electronic Signature(s) Signed: 07/04/2021 6:39:17 PM By: Dellie Catholic RN Entered By: Dellie Catholic on 07/04/2021 18:31:55 -------------------------------------------------------------------------------- Pain Assessment  Details Patient Name: Date of Service: [name], [name] 07/04/2021 8:00 A M Medical Record Number: [id number] Patient Account Number: [id number] Date of Birth/Sex: Treating RN: 1970-01-24 (51 y.o. [name] Primary Care Hollyanne Schloesser: [name] Other Clinician: Referring Kerry Chisolm: Treating Cole Eastridge/Extender: Elson Areas in Treatment: 0 Active Problems Location of Pain Severity and Description of Pain Patient Has Paino No Site Locations Pain Management and Medication Current Pain Management: Electronic Signature(s) Signed: 07/04/2021 6:39:17 PM By: Dellie Catholic RN Entered By: Dellie Catholic on 07/04/2021 08:41:03 -------------------------------------------------------------------------------- Patient/Caregiver Education Details Patient Name: Date of Service: [place], [name] 6/15/2023andnbsp8:00 A M Medical Record Number: [id number] Patient Account Number: [id number] Date of Birth/Gender: Treating RN: 05/19/1970 (51 y.o. [name] Primary Care Physician: [name] Other Clinician: Referring Physician: Treating Physician/Extender: Elson Areas in Treatment: 0 Education Assessment Education Provided To: Patient Education Topics Provided Wound/Skin Impairment: Methods: Explain/Verbal Responses: Return demonstration correctly Electronic Signature(s) Signed: 07/04/2021 6:39:17 PM By: Dellie Catholic RN Entered By: Dellie Catholic on 07/04/2021 18:32:05 -------------------------------------------------------------------------------- Wound Assessment Details Patient Name: Date of Service: [place], [name] 07/04/2021 8:00 A M Medical Record Number: [id number] Patient Account Number: [id number] Date of Birth/Sex: Treating RN: Dec 07, 1970 (51 y.o. [name] Primary Care Ashunti Schofield: [name] Other Clinician: Referring Romelia Bromell: Treating Talmadge Ganas/Extender: Elson Areas in Treatment: 0 Wound Status Wound Number: 2 Primary Etiology: Diabetic Wound/Ulcer of the Lower Extremity Wound Location: Right, Lateral, Plantar Foot Wound Status: Open Wounding Event: Gradually Appeared Comorbid History: Type II Diabetes Date Acquired: 12/19/2016 Weeks Of Treatment: 0 Clustered Wound: No Photos Wound Measurements Length: (cm) 1 Width: (cm) 4.6 Depth: (cm) 5 Area: (cm) 3.613 Volume: (cm) 18.064 % Reduction in Area: -513.4% % Reduction in Volume: -10105.6% Epithelialization: None Tunneling: No Undermining: No Wound Description Classification: Grade 2 Exudate Amount: Medium Exudate Type: Serosanguineous Exudate Color: red, brown Foul Odor After Cleansing: No Slough/Fibrino Yes Wound Bed Granulation Amount: None Present (0%) Exposed Structure Necrotic Amount: Large (67-100%) Fascia Exposed: No Necrotic Quality: Adherent Slough Fat Layer (Subcutaneous Tissue) Exposed: Yes Tendon Exposed: No Muscle Exposed: No Joint Exposed: No Bone Exposed: No Treatment Notes Wound #2 (Foot) Wound Laterality: Plantar, Right, Lateral Cleanser Soap and Water Discharge Instruction: May shower and wash wound with dial antibacterial soap and water prior to dressing change. Wound Cleanser Discharge Instruction: Cleanse the wound with wound cleanser prior to applying a clean dressing using gauze sponges, not tissue or cotton balls. Peri-Wound Care cotton tipped applicators Topical Gentamicin Discharge Instruction: As directed by physician Primary Dressing KerraCel Ag Gelling Fiber Dressing, 4x5 in (silver alginate) Discharge Instruction: Apply silver alginate to wound bed as instructed Secondary Dressing Secured With Paper Tape, 2x10 (in/yd)  Discharge Instruction: Secure dressing with tape as directed. Compression Wrap Kerlix Roll 4.5x3.1 (in/yd) Discharge Instruction: Apply Kerlix and Coban compression as directed. Compression  Stockings Add-Ons Electronic Signature(s) Signed: 07/04/2021 6:39:17 PM By: Dellie Catholic RN Entered By: Dellie Catholic on 07/04/2021 09:07:31 -------------------------------------------------------------------------------- Vitals Details Patient Name: Date of Service: [name], [name] 07/04/2021 8:00 A M Medical Record Number: [id number] Patient Account Number: [id number] Date of Birth/Sex: Treating RN: 1970-03-30 (51 y.o. [name] Primary Care Kc Summerson: [name] Other Clinician: Referring Teandre Hamre: Treating Aviyon Hocevar/Extender: Elson Areas in Treatment: 0 Vital Signs Time Taken: 08:00 Temperature (F): 98.5 Height (in): 72 Pulse (bpm): 77 Source: Stated Respiratory Rate (breaths/min): 16 Weight (lbs): 312 Blood Pressure (mmHg): 162/92 Source: Stated Reference Range: 80 - 120 mg / dl Body Mass Index (BMI): 42.3 Electronic Signature(s) Signed: 07/04/2021 6:39:17 PM By: Dellie Catholic RN Entered By: Dellie Catholic on 07/04/2021 08:22:31

## 2021-07-04 NOTE — Telephone Encounter (Signed)
[name] said that patient was seen today '@8'$ :00.

## 2021-07-05 NOTE — Progress Notes (Signed)
[name], [name] ([id number]) Visit Report for 07/04/2021 Chief Complaint Document Details Patient Name: Date of Service: [name], [name] 07/04/2021 8:00 A M Medical Record Number: [id number] Patient Account Number: [id number] Date of Birth/Sex: Treating RN: Sep 21, 1970 [name] Primary Care Provider: [name] Other Clinician: Referring Provider: Treating Provider/Extender: Elson Areas in Treatment: 0 Information Obtained from: Patient Chief Complaint Patients presents for treatment of an open diabetic ulcer Electronic Signature(s) Signed: 07/04/2021 9:18:56 AM By: [name] MD FACS Entered By: [name] on 07/04/2021 09:18:56 -------------------------------------------------------------------------------- Debridement Details Patient Name: Date of Service: [place], [name] 07/04/2021 8:00 A M Medical Record Number: [id number] Patient Account Number: [id number] Date of Birth/Sex: Treating RN: March 17, 1970 [name] Primary Care Provider: [name] Other Clinician: Referring Provider: Treating Provider/Extender: Elson Areas in Treatment: 0 Debridement Performed for Assessment: Wound #2 [place] Performed By: Physician [name], MD Debridement Type: Debridement Severity of Tissue Pre Debridement: Fat layer exposed Level of Consciousness (Pre-procedure): Awake and Alert Pre-procedure Verification/Time Out Yes - 08:55 Taken: Start Time: 08:55 Pain Control: Other : Benzocaine 20% T Area Debrided (L x W): otal 1 (cm) x 4.6 (cm) = 4.6 (cm) Tissue and other material debrided: Viable, Non-Viable, Callus, Slough, Subcutaneous, Skin: Dermis , Skin: Epidermis, Slough Level: Skin/Subcutaneous Tissue Debridement Description: Excisional Instrument: Curette Bleeding: Moderate Hemostasis Achieved: Silver Nitrate End Time: 08:57 Procedural Pain: 0 Post  Procedural Pain: 0 Response to Treatment: Procedure was tolerated well Level of Consciousness (Post- Awake and Alert procedure): Post Debridement Measurements of Total Wound Length: (cm) 1 Width: (cm) 4.6 Depth: (cm) 5 Volume: (cm) 18.064 Character of Wound/Ulcer Post Debridement: Improved Severity of Tissue Post Debridement: Fat layer exposed Post Procedure Diagnosis Same as Pre-procedure Electronic Signature(s) Signed: 07/04/2021 10:07:19 AM By: [name] MD FACS Signed: 07/04/2021 6:39:17 PM By: Dellie Catholic RN Entered By: Dellie Catholic on 07/04/2021 09:11:08 -------------------------------------------------------------------------------- HPI Details Patient Name: Date of Service: [name], [name] 07/04/2021 8:00 A M Medical Record Number: [id number] Patient Account Number: [id number] Date of Birth/Sex: Treating RN: 1970/02/23 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 The most recent amputation was in August 2022. At that operation, antibiotic beads were placed in the wound. He has been managed by podiatry for his procedures and management of his wounds. He has been in a Water engineer. He is on oral doxycycline. They have been using Betadine wet to dry dressings along with Iodosorb. The patient states that when he thinks the wound is getting too dry, he applies topical Neosporin. At his last visit, on June 7 of this year, the podiatrist determined that he felt the wound was stalled and referred him to wound care for additional evaluation and management. An MRI has been ordered, but  not yet scheduled or performed. Pathology from his operation in August 2022 demonstrated findings consistent with chronic osteomyelitis. Today, there is a large irregular wound on the plantar surface of his right foot, at about the level of the fifth metatarsal head. This tracks through to a pinpoint opening on the dorsal lateral portion of his foot. The intake  nurse reported purulent drainage. There is some malodor from the wound. No frank necrosis identified. Electronic Signature(s) Signed: 07/04/2021 9:31:59 AM By: [name] MD FACS Previous Signature: 07/04/2021 9:21:52 AM Version By: [name] MD FACS Entered By: [name] on 07/04/2021 09:31:59 -------------------------------------------------------------------------------- Physical Exam Details Patient Name: Date of Service: [name], [name] 07/04/2021 8:00 A M Medical Record Number: [id number] Patient Account Number: [id number] Date of Birth/Sex: Treating RN: Dec 01, 1970 [name] Primary Care Provider: [name] Other Clinician: Referring Provider: Treating Provider/Extender: Elson Areas in Treatment: 0 Constitutional He is hypertensive but asymptomatic.. . . . No acute distress. Respiratory Normal work of breathing on room air.. Cardiovascular Nonpalpable. Notes 07/04/2021: There is a large irregular wound on the plantar surface of his right foot, at about the level of the fifth metatarsal head. This tracks through to a pinpoint opening on the dorsal lateral portion of his foot. The intake nurse reported purulent drainage. There is some malodor from the wound. No frank necrosis identified. Electronic Signature(s) Signed: 07/04/2021 9:33:58 AM By: [name] MD FACS Entered By: [name] on 07/04/2021 09:33:57 -------------------------------------------------------------------------------- Physician Orders Details Patient Name: Date of  Service: [name], [name] 07/04/2021 8:00 A M Medical Record Number: [id number] Patient Account Number: [id number] Date of Birth/Sex: Treating RN: 08/11/1970 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 - Dr Celine Ahr room 3 Return A Bathing/ Shower/ Hygiene Other Bathing/Shower/Hygiene Orders/Instructions: - Change dressing after bathing Off-Loading Other: - Try to not stand on your feet too much Wound Treatment Wound #2 - Foot Wound Laterality: Plantar, Right, Lateral Cleanser: Soap and Water Every Other Day/15 Days Discharge Instructions: May shower and wash wound with dial antibacterial soap and water prior to dressing change. Cleanser: Wound Cleanser (DME) (Generic) Every Other Day/15 Days Discharge Instructions: Cleanse the wound with wound cleanser prior to applying a clean dressing using gauze sponges, not tissue or cotton balls. Peri-Wound Care: cotton tipped applicators (DME) (Generic) Every Other Day/15 Days Topical: Gentamicin Every Other Day/15 Days Discharge Instructions: As directed by physician Prim Dressing: KerraCel Ag Gelling Fiber Dressing, 4x5 in (silver alginate) (DME) (Generic) Every Other Day/15 Days ary Discharge Instructions: Apply silver alginate to wound bed as instructed Secured With: Paper Tape, 2x10 (in/yd) (DME) (Generic) Every Other Day/15 Days Discharge Instructions: Secure dressing with tape as directed. Compression Wrap: Kerlix Roll 4.5x3.1 (in/yd) (DME) (Generic) Every Other  Day/15 Days Discharge Instructions: Apply Kerlix and Coban compression as directed. Patient Medications llergies: No Known Allergies A Notifications Medication Indication Start End 07/04/2021 gentamicin DOSE topical 0.1 % ointment - pack into wound with silver alginate at each dressing change Electronic Signature(s) Signed: 07/04/2021 9:34:58 AM By: [name] MD FACS Entered By: [name] on 07/04/2021 09:34:58 -------------------------------------------------------------------------------- Problem List Details Patient Name: Date of Service: [name], [name] 07/04/2021 8:00 A M Medical Record Number: [id number] Patient Account Number: [id number] Date of Birth/Sex: Treating RN: 08-10-1970 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 3/97/6734 No Yes M86.671 Other chronic osteomyelitis, right ankle and foot 07/04/2021 No Yes L97.514 Non-pressure chronic ulcer of other part of right foot with necrosis of bone 07/04/2021 No Yes Inactive Problems Resolved Problems Electronic Signature(s) Signed: 07/04/2021 9:18:36 AM By: [name] MD FACS Previous Signature: 07/04/2021 8:24:30 AM Version By: [name] MD FACS Entered By: [name] on 07/04/2021 09:18:36 -------------------------------------------------------------------------------- Progress Note Details Patient Name: Date of Service: [name], [name] 07/04/2021 8:00 A M Medical Record Number: [id number] Patient Account Number: [id number] Date of Birth/Sex: Treating RN: 09-05-1970 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 The most recent amputation was in August 2022. At that operation, antibiotic beads were placed in the wound. He has been managed by podiatry for his procedures and management of his wounds. He has been in a Water engineer. He is on oral doxycycline. They have been using Betadine wet to dry dressings along with Iodosorb. The patient states that when he thinks the wound is getting too dry, he applies topical Neosporin. At his last visit, on June 7 of this year, the podiatrist determined that he felt the wound was stalled and referred him to wound care for additional evaluation and management. An MRI has been ordered, but not yet scheduled or performed. Pathology from his operation in August 2022 demonstrated findings consistent with chronic osteomyelitis. Today, there is a large irregular wound on the plantar surface of his right foot, at about the level of the fifth metatarsal head. This tracks through to a pinpoint opening on the dorsal lateral portion of his foot. The intake nurse reported purulent drainage. There is some malodor from the wound. No frank necrosis identified. Patient History Information obtained from Patient. Allergies No Known Allergies Social History Never smoker, Marital Status - Married, Alcohol Use - Never, Drug Use - No History, Caffeine Use - Daily - T coffee. ea; Medical History Endocrine Patient has history of Type II Diabetes Hospitalization/Surgery History - Amuptation of 3rd,4th and 5th toes of Right foot;Oral Surgery;Anal Fissure  surgery; Cholecystectomy. Review of Systems (ROS) Integumentary (Skin) Complains or has symptoms of Wounds - Right foot. Objective Constitutional He is hypertensive but asymptomatic.Marland Kitchen No acute distress. Vitals Time Taken: 8:00 AM, Height: 72 in, Source: Stated, Weight: 312 lbs, Source: Stated, BMI: 42.3, Temperature: 98.5 F, Pulse: 77 bpm, Respiratory Rate: 16 breaths/min, Blood Pressure: 162/92 mmHg. Respiratory Normal work of breathing on room air.. Cardiovascular Nonpalpable. General Notes: 07/04/2021: There is a large irregular wound on the plantar surface of his right foot, at about the level of the fifth metatarsal head. This tracks through to a pinpoint opening on the dorsal lateral portion of his foot. The intake nurse reported purulent drainage. There is some malodor from the wound. No frank necrosis identified. Integumentary (Hair, Skin) Wound #2 status is Open. Original cause of wound was Gradually Appeared. The date acquired was: 12/19/2016. The wound is located on the [place]. The wound measures 1cm length x 4.6cm width x  5cm depth; 3.613cm^2 area and 18.064cm^3 volume. There is Fat Layer (Subcutaneous Tissue) exposed. There is no tunneling or undermining noted. There is a medium amount of serosanguineous drainage noted. There is no granulation within the wound bed. There is a large (67-100%) amount of necrotic tissue within the wound bed including Adherent Slough. 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 There was a Excisional Skin/Subcutaneous Tissue Debridement  with a total area of 4.6 sq cm performed by [name], MD. With the following instrument(s): Curette to remove Viable and Non-Viable tissue/material. Material removed includes Callus, Subcutaneous Tissue, Slough, Skin: Dermis, and Skin: Epidermis after achieving pain control using Other (Benzocaine 20%). No specimens were taken. A time out was conducted at 08:55, prior to the start of the procedure. A Moderate amount of bleeding was controlled with Silver Nitrate. The procedure was tolerated well with a pain level of 0 throughout and a pain level of 0 following the procedure. Post Debridement Measurements: 1cm length x 4.6cm width x 5cm depth; 18.064cm^3 volume. Character of Wound/Ulcer Post Debridement is improved. Severity of Tissue Post Debridement is: Fat layer exposed. Post procedure Diagnosis Wound #2: Same as Pre-Procedure Plan Follow-up Appointments: Return Appointment in 1 week. - Dr Celine Ahr room 3 Bathing/ Shower/ Hygiene: Other Bathing/Shower/Hygiene Orders/Instructions: - Change dressing after bathing Off-Loading: Other: - Try to not stand on your feet too much The following medication(s) was prescribed: gentamicin topical 0.1 % ointment pack into wound with silver alginate at each dressing change starting 07/04/2021 WOUND #2: - Foot Wound Laterality: Plantar, Right, Lateral Cleanser: Soap and Water Every Other Day/15 Days Discharge Instructions: May shower and wash wound with dial antibacterial soap and water prior to dressing change. Cleanser: Wound Cleanser (DME) (Generic) Every Other Day/15 Days Discharge Instructions: Cleanse the wound with wound cleanser prior to applying a clean dressing using gauze sponges, not tissue or cotton balls. Peri-Wound Care: cotton tipped applicators (DME) (Generic) Every Other Day/15 Days Topical: Gentamicin Every Other Day/15 Days Discharge Instructions: As directed by physician Prim Dressing: KerraCel Ag Gelling Fiber Dressing, 4x5 in  (silver alginate) (DME) (Generic) Every Other Day/15 Days ary Discharge Instructions: Apply silver alginate to wound bed as instructed Secured With: Paper T ape, 2x10 (in/yd) (DME) (Generic) Every Other Day/15 Days Discharge Instructions: Secure dressing with tape as directed. Com pression Wrap: Kerlix Roll 4.5x3.1 (in/yd) (DME) (Generic) Every Other Day/15 Days Discharge Instructions: Apply Kerlix and Coban compression as directed. 07/04/2021: There is a large irregular wound on the plantar surface of his right foot, at about the level of the fifth metatarsal head. This tracks through to a pinpoint opening on the dorsal lateral portion of his foot. The intake nurse reported purulent drainage. There is some malodor from the wound. No frank necrosis identified. I used a curette to debride callus, slough, and subcutaneous tissue from the wound. We will pack the wound with topical gentamicin and silver alginate. He may end up being a candidate for hyperbaric oxygen therapy if we do not make adequate progress with his wound. He will continue wearing his Crow immobilizer, but if we do not make progress, I may request that he start using a knee scooter. Follow-up  in 1 week. Electronic Signature(s) Signed: 07/04/2021 9:36:13 AM By: [name] MD FACS Entered By: [name] on 07/04/2021 09:36:13 -------------------------------------------------------------------------------- HxROS Details Patient Name: Date of Service: [name], [name] 07/04/2021 8:00 A M Medical Record Number: [id number] Patient Account Number: [id number] Date of Birth/Sex: Treating RN: 08-29-1970 [name] Primary Care Provider: [name] Other Clinician: Referring Provider: Treating Provider/Extender: Elson Areas in Treatment: 0 Information Obtained From Patient Integumentary (Skin) Complaints and Symptoms: Positive for: Wounds - Right foot Endocrine Medical  History: Positive for: Type II Diabetes Immunizations Pneumococcal Vaccine: Received Pneumococcal Vaccination: Yes Received Pneumococcal Vaccination On or After 60th Birthday: No Implantable Devices None Hospitalization / Surgery History Type of Hospitalization/Surgery Amuptation of 3rd,4th and 5th toes of Right foot;Oral Surgery;Anal Fissure surgery; Cholecystectomy Family and Social History Never smoker; Marital Status - Married; Alcohol Use: Never; Drug Use: No History; Caffeine Use: Daily - T coffee; Financial Concerns: No; Food, ea; Clothing or Shelter Needs: No; Support System Lacking: No; Transportation Concerns: No Electronic Signature(s) Signed: 07/04/2021 10:07:19 AM By: [name] MD FACS Signed: 07/04/2021 6:39:17 PM By: Dellie Catholic RN Entered By: Dellie Catholic on 07/04/2021 08:18:41 -------------------------------------------------------------------------------- SuperBill Details Patient Name: Date of Service: [name], [name] 07/04/2021 Medical Record Number: [id number] Patient Account Number: [id number] Date of Birth/Sex: Treating RN: 1970/04/30 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 D22.025 Other chronic osteomyelitis, right ankle and foot L97.514 Non-pressure chronic ulcer of other part of right foot with necrosis of bone Facility Procedures CPT4 Code: [id number] Description: [place] VISIT-LEV 3 EST PT Modifier: 25 Quantity: 1 CPT4 Code: [id number] Description: 61607 - DEB SUBQ TISSUE 20 SQ CM/< ICD-10 Diagnosis Description L97.514 Non-pressure chronic ulcer of other part of right foot with necrosis of bone Modifier: Quantity: 1 Physician Procedures : CPT4 Code  Description Modifier [id number] - WC PHYS LEVEL 4 - NEW PT 25 ICD-10 Diagnosis Description 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 O27.035 Other chronic osteomyelitis, right ankle and foot L97.514  Non-pressure chronic ulcer of other part of right foot with necrosis of bone Quantity: 1 : [id number] 11042 - WC PHYS SUBQ TISS 20 SQ CM ICD-10 Diagnosis Description ICD-10 Diagnosis Description L97.514 Non-pressure chronic ulcer of other part of right foot with necrosis of bone Quantity: 1 Electronic Signature(s) Signed: 07/04/2021 6:39:17 PM By: Dellie Catholic RN Signed: 07/05/2021 7:27:00 AM By: [name] MD FACS Previous Signature: 07/04/2021 9:36:35 AM Version By: [name] MD FACS Entered By: Dellie Catholic on 07/04/2021 18:37:45

## 2021-07-11 ENCOUNTER — Encounter (HOSPITAL_BASED_OUTPATIENT_CLINIC_OR_DEPARTMENT_OTHER): Payer: BLUE CROSS/BLUE SHIELD | Admitting: General Surgery

## 2021-07-11 ENCOUNTER — Telehealth: Payer: Self-pay | Admitting: *Deleted

## 2021-07-11 DIAGNOSIS — E11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ical complication: Secondary | ICD-10-CM | POA: Diagnosis not present

## 2021-07-11 NOTE — Telephone Encounter (Signed)
Patient's wife called and asked if they needed to keep upcoming appointment in a few weeks or just let the wound care center follow his wound for now?

## 2021-07-16 ENCOUNTER — Other Ambulatory Visit: Payer: Self-pay | Admitting: Family Medicine

## 2021-07-18 ENCOUNTER — Encounter: Payer: Self-pay | Admitting: Podiatry

## 2021-07-18 ENCOUNTER — Ambulatory Visit
Admission: RE | Admit: 2021-07-18 | Discharge: 2021-07-18 | Disposition: A | Discharge status: Home / Self Care | Payer: BLUE CROSS/BLUE SHIELD | Source: Ambulatory Visit | Attending: Podiatry | Admitting: Podiatry

## 2021-07-18 DIAGNOSIS — E11621 Type 2 diabetes mellitus with foot ulcer: Secondary | ICD-10-CM

## 2021-07-18 MED ORDER — GADOBENATE DIMEGLUMINE 529 MG/ML IV SOLN
20.0000 mL | Freq: Once | INTRAVENOUS | Status: AC | PRN
Start: 2021-07-18 — End: 2021-07-18
  Administered 2021-07-18: 20 mL via INTRAVENOUS

## 2021-07-19 ENCOUNTER — Encounter (HOSPITAL_BASED_OUTPATIENT_CLINIC_OR_DEPARTMENT_OTHER): Payer: BLUE CROSS/BLUE SHIELD | Admitting: General Surgery

## 2021-07-19 DIAGNOSIS — E11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ical complication: Secondary | ICD-10-CM | POA: Diagnosis not present

## 2021-07-19 NOTE — Progress Notes (Signed)
[name], [name] ([id number]) Visit Report for 07/19/2021 Arrival Information Details Patient Name: Date of Service: [name], [name] 07/19/2021 3:15 PM Medical Record Number: [id number] Patient Account Number: [id number] Date of Birth/Sex: Treating RN: 1970/01/22 (51 y.o. [name], [name] Primary Care Tamiki Kuba: [name] Other Clinician: Referring [name]: Treating Brandonlee Navis/Extender: [name]: 2 Visit Information History Since Last Visit Added or deleted any medications: No Patient Arrived: Ambulatory Any new allergies or adverse reactions: No Arrival Time: 15:10 Had a fall or experienced change [name] No Accompanied By: self activities of daily living that may affect Transfer Assistance: None risk of falls: Patient Identification Verified: Yes Signs or symptoms of abuse/neglect since last visito No Secondary Verification Process Completed: Yes Hospitalized since last visit: No Patient Requires Transmission-Based Precautions: No Implantable device outside of the clinic excluding No Patient Has Alerts: Yes cellular tissue based products placed [name] the center Patient Alerts: ABI R 1.17 since last visit: ABI L 1.29 Has Dressing [name] Place as Prescribed: Yes R TBI 1.03 Pain Present Now: No L TBI 0.77 Electronic Signature(s) Signed: 07/19/2021 4:48:44 PM By: Deon Pilling RN, BSN Entered By: Deon Pilling on 07/19/2021 15:20:07 -------------------------------------------------------------------------------- Encounter Discharge Information Details Patient Name: Date of Service: [name], [name] 07/19/2021 3:15 PM Medical Record Number: [id number] Patient Account Number: [id number] Date of Birth/Sex: Treating RN: July 01, 1970 [name] Primary Care Nikolaus Pienta: [name] Other Clinician: Referring [name]: Treating [name]/Extender: [name]: 2 Encounter Discharge  Information Items Post Procedure Vitals Discharge Condition: Stable Temperature (F): 98.5 Ambulatory Status: Ambulatory Pulse (bpm): 85 Discharge Destination: Home Respiratory Rate (breaths/min): 20 Transportation: Private Auto Blood Pressure (mmHg): 145/88 Accompanied By: self Schedule Follow-up Appointment: Yes Clinical Summary of Care: Patient Declined Electronic Signature(s) Signed: 07/19/2021 6:10:44 PM By: Dellie Catholic RN Entered By: Dellie Catholic on 07/19/2021 17:58:07 -------------------------------------------------------------------------------- Lower Extremity Assessment Details Patient Name: Date of Service: [name], [name] 07/19/2021 3:15 PM Medical Record Number: [id number] Patient Account Number: [id number] Date of Birth/Sex: Treating RN: 1970-12-16 (51 y.o. [name] Primary Care Henrik Orihuela: [name] Other Clinician: Referring [name]: Treating Briceson Broadwater/Extender: [name]: 2 Edema Assessment Assessed: [name]: No] [name]: Yes] Edema: [Left: N] [Right: o] Calf Left: Right: Point of Measurement: From Medial Instep 41 cm Ankle Left: Right: Point of Measurement: From Medial Instep 24.5 cm Vascular Assessment Pulses: Dorsalis Pedis Palpable: [Right:Yes] Electronic Signature(s) Signed: 07/19/2021 4:48:44 PM By: Deon Pilling RN, BSN Entered By: Deon Pilling on 07/19/2021 15:20:59 -------------------------------------------------------------------------------- Multi Wound Chart Details Patient Name: Date of Service: [name], [name] 07/19/2021 3:15 PM Medical Record Number: [id number] Patient Account Number: [id number] Date of Birth/Sex: Treating RN: 04-09-1970 [name] Primary Care Felesha Moncrieffe: [name] Other Clinician: Referring Pariss Hommes: Treating [name]/Extender: [name]: 2 Vital Signs Height([name]: 72 Capillary Blood  Glucose(mg/dl): 90 Weight(lbs): 312 Pulse(bpm): 13 Body Mass Index(BMI): 42.3 Blood Pressure(mmHg): 145/88 Temperature(F): 98.5 Respiratory Rate(breaths/min): 20 Photos: [2:Right, Lateral, Plantar Foot] [3:Right, Lateral Foot] [N/A:N/A N/A] Wound Location: [2:Gradually Appeared] [3:Gradually Appeared] [N/A:N/A] Wounding Event: [2:Diabetic Wound/Ulcer of the Lower] [3:Diabetic Wound/Ulcer of the Lower] [N/A:N/A] Primary Etiology: [2:Extremity Type II Diabetes] [3:Extremity Type II Diabetes] [N/A:N/A] Comorbid History: [2:12/19/2016] [3:05/24/2016] [N/A:N/A] Date Acquired: [2:2] [3:1] [N/A:N/A] Weeks of Treatment: [2:Open] [3:Healed - Epithelialized] [N/A:N/A] Wound Status: [2:No] [3:No] [N/A:N/A] Wound Recurrence: [2:0.3x2x2.2] [3:0x0x0] [N/A:N/A] Measurements L x W x D (cm) [2:0.471] [3:0] [N/A:N/A]  A (cm) : rea [2:1.037] [3:0] [N/A:N/A] Volume (cm) : [2:87.00%] [3:100.00%] [N/A:N/A] % Reduction [name] A [2:rea: 94.30%] [3:100.00%] [N/A:N/A] % Reduction [name] Volume: [2:12] Starting Position 1 (o'clock): [2:12] Ending Position 1 (o'clock): [2:0.9] Maximum Distance 1 (cm): [2:Yes] [3:No] [N/A:N/A] Undermining: [2:Grade 2] [3:Grade 2] [N/A:N/A] Classification: [2:Medium] [3:None Present] [N/A:N/A] Exudate A mount: [2:Serosanguineous] [3:N/A] [N/A:N/A] Exudate Type: [2:red, brown] [3:N/A] [N/A:N/A] Exudate Color: [2:Thickened] [3:Distinct, outline attached] [N/A:N/A] Wound Margin: [2:Large (67-100%)] [3:None Present (0%)] [N/A:N/A] Granulation A mount: [2:Red, Friable] [3:N/A] [N/A:N/A] Granulation Quality: [2:Small (1-33%)] [3:None Present (0%)] [N/A:N/A] Necrotic A mount: [2:Fat Layer (Subcutaneous Tissue): Yes Fascia: No] [N/A:N/A] Exposed Structures: [2:Fascia: No Tendon: No Muscle: No Joint: No Bone: No Small (1-33%)] [3:Fat Layer (Subcutaneous Tissue): No Tendon: No Muscle: No Joint: No Bone: No Large (67-100%)] [N/A:N/A] Epithelialization: [2:Debridement - Excisional] [3:N/A]  [N/A:N/A] Debridement: Pre-procedure Verification/Time Out 15:40 [3:N/A] [N/A:N/A] Taken: [2:Lidocaine 4% Topical Solution] [3:N/A] [N/A:N/A] Pain Control: [2:Necrotic/Eschar, Subcutaneous,] [3:N/A] [N/A:N/A] Tissue Debrided: [2:Slough Skin/Subcutaneous Tissue] [3:N/A] [N/A:N/A] Level: [2:0.6] [3:N/A] [N/A:N/A] Debridement A (sq cm): [2:rea Curette] [3:N/A] [N/A:N/A] Instrument: [2:Minimum] [3:N/A] [N/A:N/A] Bleeding: [2:Pressure] [3:N/A] [N/A:N/A] Hemostasis Achieved: [2:0] [3:N/A] [N/A:N/A] Procedural Pain: [2:0] [3:N/A] [N/A:N/A] Post Procedural Pain: Debridement Treatment Response: Procedure was tolerated well [3:N/A] [N/A:N/A] Post Debridement Measurements L x 0.3x2x2.2 [3:N/A] [N/A:N/A] W x D (cm) [2:1.037] [3:N/A] [N/A:N/A] Post Debridement Volume: (cm) [2:Debridement] [3:N/A] [N/A:N/A] Treatment Notes Electronic Signature(s) Signed: 07/19/2021 4:00:42 PM By: [name] MD FACS Signed: 07/19/2021 6:10:44 PM By: Dellie Catholic RN Entered By: [name] on 07/19/2021 16:00:42 -------------------------------------------------------------------------------- Multi-Disciplinary Care Plan Details Patient Name: Date of Service: [name], [name] 07/19/2021 3:15 PM Medical Record Number: [id number] Patient Account Number: [id number] Date of Birth/Sex: Treating RN: March 28, 1970 (50 y.o. [name] Primary Care Boyd Buffalo: [name] Other Clinician: Referring Jordis Repetto: Treating Fardowsa Authier/Extender: [name]: 2 Active Inactive Wound/Skin Impairment Nursing Diagnoses: Impaired tissue integrity Goals: Patient/caregiver will verbalize understanding of skin care regimen Date Initiated: 07/04/2021 Target Resolution Date: 08/16/2021 Goal Status: Active Interventions: Assess ulceration(s) every visit Treatment Activities: Skin care regimen initiated : 07/04/2021 Notes: Electronic Signature(s) Signed: 07/19/2021 6:10:44  PM By: Dellie Catholic RN Entered By: Dellie Catholic on 07/19/2021 17:51:43 -------------------------------------------------------------------------------- Pain Assessment Details Patient Name: Date of Service: [name], [name] 07/19/2021 3:15 PM Medical Record Number: [id number] Patient Account Number: [id number] Date of Birth/Sex: Treating RN: 1971/01/12 (51 y.o. [name] Primary Care Shasta Chinn: [name] Other Clinician: Referring Donalee Gaumond: Treating Noe Pittsley/Extender: [name]: 2 Active Problems Location of Pain Severity and Description of Pain Patient Has Paino No Site Locations Rate the pain. Current Pain Level: 0 Pain Management and Medication Current Pain Management: Medication: No Cold Application: No Rest: No Massage: No Activity: No T.E.N.S.: No Heat Application: No Leg drop or elevation: No Is the Current Pain Management Adequate: Adequate How does your wound impact your activities of daily livingo Sleep: No Bathing: No Appetite: No Relationship With Others: No Bladder Continence: No Emotions: No Bowel Continence: No Work: No Toileting: No Drive: No Dressing: No Hobbies: No Engineer, maintenance) Signed: 07/19/2021 4:48:44 PM By: Deon Pilling RN, BSN Entered By: Deon Pilling on 07/19/2021 15:20:39 -------------------------------------------------------------------------------- Patient/Caregiver Education Details Patient Name: Date of Service: Horald Chestnut, [name] 6/30/2023andnbsp3:15 PM Medical Record Number: [id number] Patient Account Number: [id number] Date of Birth/Gender: Treating RN: 11-01-1970 [name] Primary Care Physician: [name] Other Clinician: Referring Physician: Treating Physician/Extender: [name]  Treatment: 2 Education Assessment Education Provided To: Patient Education Topics Provided Wound/Skin  Impairment: Methods: Explain/Verbal Responses: Return demonstration correctly Electronic Signature(s) Signed: 07/19/2021 6:10:44 PM By: Dellie Catholic RN Entered By: Dellie Catholic on 07/19/2021 17:51:58 -------------------------------------------------------------------------------- Wound Assessment Details Patient Name: Date of Service: [name], [name] 07/19/2021 3:15 PM Medical Record Number: [id number] Patient Account Number: [id number] Date of Birth/Sex: Treating RN: 1970-04-17 (51 y.o. [name], [name] Primary Care Leotha Westermeyer: [name] Other Clinician: Referring Enma Maeda: Treating Inola Lisle/Extender: [name]: 2 Wound Status Wound Number: 2 Primary Etiology: Diabetic Wound/Ulcer of the Lower Extremity Wound Location: Right, Lateral, Plantar Foot Wound Status: Open Wounding Event: Gradually Appeared Comorbid History: Type II Diabetes Date Acquired: 12/19/2016 Weeks Of Treatment: 2 Clustered Wound: No Photos Wound Measurements Length: (cm) 0.3 Width: (cm) 2 Depth: (cm) 2.2 Area: (cm) 0.471 Volume: (cm) 1.037 % Reduction [name] Area: 87% % Reduction [name] Volume: 94.3% Epithelialization: Small (1-33%) Tunneling: No Undermining: Yes Starting Position (o'clock): 12 Ending Position (o'clock): 12 Maximum Distance: (cm) 0.9 Wound Description Classification: Grade 2 Wound Margin: Thickened Exudate Amount: Medium Exudate Type: Serosanguineous Exudate Color: red, brown Foul Odor After Cleansing: No Slough/Fibrino Yes Wound Bed Granulation Amount: Large (67-100%) Exposed Structure Granulation Quality: Red, Friable Fascia Exposed: No Necrotic Amount: Small (1-33%) Fat Layer (Subcutaneous Tissue) Exposed: Yes Necrotic Quality: Adherent Slough Tendon Exposed: No Muscle Exposed: No Joint Exposed: No Bone Exposed: No Treatment Notes Wound #2 (Foot) Wound Laterality: Plantar, Right, Lateral Cleanser Soap and  Water Discharge Instruction: May shower and wash wound with dial antibacterial soap and water prior to dressing change. Wound Cleanser Discharge Instruction: Cleanse the wound with wound cleanser prior to applying a clean dressing using gauze sponges, not tissue or cotton balls. Peri-Wound Care cotton tipped applicators Topical Gentamicin Discharge Instruction: As directed by physician Primary Dressing KerraCel Ag Gelling Fiber Dressing, 4x5 [name] (silver alginate) Discharge Instruction: Apply silver alginate to wound bed as instructed Secondary Dressing Secured With Paper Tape, 2x10 ([name]/yd) Discharge Instruction: Secure dressing with tape as directed. Compression Wrap Kerlix Roll 4.5x3.1 ([name]/yd) Discharge Instruction: Apply Kerlix and Coban compression as directed. Compression Stockings Add-Ons Electronic Signature(s) Signed: 07/19/2021 4:48:44 PM By: Deon Pilling RN, BSN Signed: 07/19/2021 6:10:44 PM By: Dellie Catholic RN Entered By: Dellie Catholic on 07/19/2021 15:28:38 -------------------------------------------------------------------------------- Wound Assessment Details Patient Name: Date of Service: [name], [name] 07/19/2021 3:15 PM Medical Record Number: [id number] Patient Account Number: [id number] Date of Birth/Sex: Treating RN: [birth year] (51 y.o. [name], [name] Primary Care Regie Bunner: [name] Other Clinician: Referring Leily Capek: Treating Kayla Weekes/Extender: [name]: 2 Wound Status Wound Number: 3 Primary Etiology: Diabetic Wound/Ulcer of the Lower Extremity Wound Location: Right, Lateral Foot Wound Status: Healed - Epithelialized Wounding Event: Gradually Appeared Comorbid History: Type II Diabetes Date Acquired: 05/24/2016 Weeks Of Treatment: 1 Clustered Wound: No Photos Wound Measurements Length: (cm) Width: (cm) Depth: (cm) Area: (cm) Volume: (cm) 0 % Reduction [name] Area: 100% 0 % Reduction [name]  Volume: 100% 0 Epithelialization: Large (67-100%) 0 Tunneling: No 0 Undermining: No Wound Description Classification: Grade 2 Wound Margin: Distinct, outline attached Exudate Amount: None Present Foul Odor After Cleansing: No Slough/Fibrino No Wound Bed Granulation Amount: None Present (0%) Exposed Structure Necrotic Amount: None Present (0%) Fascia Exposed: No Fat Layer (Subcutaneous Tissue) Exposed: No Tendon Exposed: No Muscle Exposed: No Joint Exposed: No Bone Exposed: No Electronic Signature(s) Signed: 07/19/2021 4:48:44 PM By: Deon Pilling RN, BSN Signed: 07/19/2021 6:10:44 PM  By: Dellie Catholic RN Entered By: Dellie Catholic on 07/19/2021 15:36:43 -------------------------------------------------------------------------------- Vitals Details Patient Name: Date of Service: [name], [name] 07/19/2021 3:15 PM Medical Record Number: [id number] Patient Account Number: [id number] Date of Birth/Sex: Treating RN: 12/28/1970 (51 y.o. [name] Primary Care Shinichi Anguiano: [name] Other Clinician: Referring Ludwig Tugwell: Treating Axle Parfait/Extender: [name]: 2 Vital Signs Time Taken: 15:10 Temperature (F): 98.5 Height ([name]: 72 Pulse (bpm): 85 Weight (lbs): 312 Respiratory Rate (breaths/min): 20 Body Mass Index (BMI): 42.3 Blood Pressure (mmHg): 145/88 Capillary Blood Glucose (mg/dl): 90 Reference Range: 80 - 120 mg / dl Electronic Signature(s) Signed: 07/19/2021 4:48:44 PM By: Deon Pilling RN, BSN Entered By: Deon Pilling on 07/19/2021 15:20:31

## 2021-07-19 NOTE — Progress Notes (Signed)
[name], [name] ([id number]) Visit Report for 07/19/2021 Chief Complaint Document Details Patient Name: Date of Service: [place], [name] 07/19/2021 3:15 PM Medical Record Number: [id number] Patient Account Number: [id number] Date of Birth/Sex: Treating RN: November 20, 1970 [name] Primary Care Provider: [name] Other Clinician: Referring Provider: Treating Provider/Extender: [name] in Treatment: 2 Information Obtained from: Patient Chief Complaint Patients presents for treatment of an open diabetic ulcer Electronic Signature(s) Signed: 07/19/2021 4:00:48 PM By: [name] MD FACS Entered By: [name] on 07/19/2021 16:00:48 -------------------------------------------------------------------------------- Debridement Details Patient Name: Date of Service: [name], [name] 07/19/2021 3:15 PM Medical Record Number: [id number] Patient Account Number: [id number] Date of Birth/Sex: Treating RN: October 03, 1970 [name] Primary Care Provider: [name] Other Clinician: Referring Provider: Treating Provider/Extender: [name] in Treatment: 2 Debridement Performed for Assessment: Wound #2 Right,Lateral,Plantar Foot Performed By: Physician [name], MD Debridement Type: Debridement Severity of Tissue Pre Debridement: Fat layer exposed Level of Consciousness (Pre-procedure): Awake and Alert Pre-procedure Verification/Time Out Yes - 15:40 Taken: Start Time: 15:40 Pain Control: Lidocaine 4% T opical Solution T Area Debrided (L x W): otal 0.3 (cm) x 2 (cm) = 0.6 (cm) Tissue and other material debrided: Non-Viable, Eschar, Slough, Subcutaneous, Slough Level: Skin/Subcutaneous Tissue Debridement Description: Excisional Instrument: Curette Bleeding: Minimum Hemostasis Achieved: Pressure End Time: 15:42 Procedural Pain: 0 Post Procedural Pain: 0 Response to  Treatment: Procedure was tolerated well Level of Consciousness (Post- Awake and Alert procedure): Post Debridement Measurements of Total Wound Length: (cm) 0.3 Width: (cm) 2 Depth: (cm) 2.2 Volume: (cm) 1.037 Character of Wound/Ulcer Post Debridement: Improved Severity of Tissue Post Debridement: Fat layer exposed Post Procedure Diagnosis Same as Pre-procedure Electronic Signature(s) Signed: 07/19/2021 4:38:32 PM By: [name] MD FACS Signed: 07/19/2021 6:10:44 PM By: Dellie Catholic RN Entered By: Dellie Catholic on 07/19/2021 15:39:11 -------------------------------------------------------------------------------- HPI Details Patient Name: Date of Service: [name], [name] 07/19/2021 3:15 PM Medical Record Number: [id number] Patient Account Number: [id number] Date of Birth/Sex: Treating RN: 12-19-1970 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 The most recent amputation was in August 2022. At that operation, antibiotic beads were placed in the wound. He has been managed by podiatry for his procedures and management of his wounds. He has been in a Water engineer. He is on oral doxycycline. They have been using Betadine wet to dry dressings along with Iodosorb. The patient states that when he thinks the wound is getting too dry, he applies topical Neosporin. At his last visit, on June 7 of this year, the podiatrist determined that he felt the wound was stalled and referred him to wound care for additional evaluation and management. An MRI has been ordered, but not yet scheduled or  performed. Pathology from his operation in August 2022 demonstrated findings consistent with chronic osteomyelitis. Today, there is a large irregular wound on the plantar surface of his right foot, at about the level of the fifth metatarsal head. This tracks through to a pinpoint opening on the dorsal lateral portion of his foot. The intake nurse reported purulent drainage. There is some malodor from  the wound. No frank necrosis identified. 07/11/2021: Today, the wounds do not connect. I attempted multiple times from various directions and the shared tunnel is no longer open. He has some slough accumulation on the dorsal part of his foot as well as slough and callus buildup on the plantar surface. His MRI is scheduled for June 29. No purulent drainage or malodor appreciated today. 07/19/2021: The lateral foot wound has closed and there is no tunnel connecting the plantar foot wound to that site. The plantar foot wound still probes quite deeply, however. There is some slough, eschar, and nonviable tissue accumulated in the wound bed. No malodorous drainage present. His MRI was performed yesterday and is consistent with fairly extensive osteomyelitis. Electronic Signature(s) Signed: 07/19/2021 4:02:20 PM By: [name] MD FACS Entered By: [name] on 07/19/2021 16:02:19 -------------------------------------------------------------------------------- Physical Exam Details Patient Name: Date of Service: [name], [name] 07/19/2021 3:15 PM Medical Record Number: [id number] Patient Account Number: [id number] Date of Birth/Sex: Treating RN: Jun 20, 1970 [name] Primary Care Provider: [name] Other Clinician: Referring Provider: Treating Provider/Extender: [name] in Treatment: 2 Constitutional Slightly hypertensive. . . . No acute distress.Marland Kitchen Respiratory Normal work of breathing on room air.. Notes 07/19/2021: The lateral foot  wound has closed and there is no tunnel connecting the plantar foot wound to that site. The plantar foot wound still probes quite deeply, however. There is some slough, eschar, and nonviable tissue accumulated in the wound bed. No malodorous drainage present. Electronic Signature(s) Signed: 07/19/2021 4:03:01 PM By: [name] MD FACS Entered By: [name] on 07/19/2021 16:03:01 -------------------------------------------------------------------------------- Physician Orders Details Patient Name: Date of Service: [name] [place], [name] 07/19/2021 3:15 PM Medical Record Number: [id number] Patient Account Number: [id number] Date of Birth/Sex: Treating RN: 1970-04-10 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 - Dr Celine Ahr Room 1 [facility] 07/29/21 3:30pm Return A Bathing/ Shower/ Hygiene Other Bathing/Shower/Hygiene Orders/Instructions: - Change dressing after bathing Off-Loading Other: - Try to not stand on your feet too much Wound Treatment Wound #2 - Foot Wound Laterality: Plantar, Right, Lateral Cleanser: Soap and Water Every Other Day/15 Days Discharge Instructions: May shower and wash wound with dial antibacterial soap and water prior to dressing change. Cleanser: Wound Cleanser (Generic) Every Other Day/15 Days Discharge Instructions: Cleanse the wound with wound cleanser prior to applying a clean dressing using gauze sponges, not tissue or cotton balls. Peri-Wound Care: cotton tipped applicators  (Generic) Every Other Day/15 Days Topical: Gentamicin Every Other Day/15 Days Discharge Instructions: As directed by physician Prim Dressing: KerraCel Ag Gelling Fiber Dressing, 4x5 in (silver alginate) (Generic) Every Other Day/15 Days ary Discharge Instructions: Apply silver alginate to wound bed as instructed Secured With: Paper Tape, 2x10 (in/yd) (Generic) Every Other Day/15 Days Discharge Instructions: Secure dressing with tape as directed. Compression Wrap: Kerlix Roll 4.5x3.1 (in/yd) (Generic) Every Other Day/15 Days Discharge Instructions: Apply Kerlix and Coban compression as directed. Consults Infectious Disease - Per Findings of Positive Osteomyelitis on the MRI (in Right Lateral Plantar Foot) ICD 76:L46.503 Electronic Signature(s) Signed: 07/19/2021 4:38:32 PM By: [name] MD  FACS Entered By: [name] on 07/19/2021 16:03:13 Prescription 07/19/2021 -------------------------------------------------------------------------------- [name], Cardarius M. [name] MD Patient Name: Provider: 10-Nov-1970 [id number] Date of Birth: NPI#: Jerilynn Mages [id number] Sex: DEA #: [phone] Phone #: License #: [place] Patient Address: [address] [place], [place] [address] [place], [place] [address] [phone] Allergies No Known Allergies Provider's Orders Infectious Disease - Per Findings of Positive Osteomyelitis on the MRI (in Right Lateral Plantar Foot) ICD 96:Q22.979 Hand Signature: Date(s): Electronic Signature(s) Signed: 07/19/2021 4:04:52 PM By: [name] MD FACS Entered By: [name] on 07/19/2021 16:04:51 -------------------------------------------------------------------------------- Problem List Details Patient Name: Date of Service: [name], [name] 07/19/2021 3:15 PM Medical Record Number: [id number] Patient Account Number: [id number] Date of  Birth/Sex: Treating RN: [birth year] 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 07/19/2021 3:15 PM Medical Record Number: [id number] Patient Account Number: [id number] Date of Birth/Sex: Treating RN: 1970/03/06 (52 y.o. 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 The most recent amputation was in August 2022. At that operation, antibiotic beads were placed in the wound. He has been managed by podiatry for his procedures and management of his wounds. He has been in a Water engineer. He is on oral doxycycline. They have been using Betadine wet to dry dressings along with Iodosorb. The patient states  that when he thinks the wound is getting too dry, he applies topical Neosporin. At his last visit, on June 7 of this year, the podiatrist determined that he felt the wound was stalled and referred him to wound care for additional evaluation and management. An MRI has been ordered, but not yet scheduled or performed. Pathology from his operation in August 2022 demonstrated findings consistent with chronic osteomyelitis. Today, there is a large irregular wound on the plantar surface of his right foot, at about the level of the fifth metatarsal head. This tracks through to a pinpoint opening on the dorsal lateral portion of his foot. The intake nurse reported purulent drainage. There is some malodor from the wound. No frank necrosis identified. 07/11/2021: Today, the wounds do not connect. I attempted multiple times from various directions and the shared tunnel is no longer open. He has some slough accumulation on the dorsal part of his foot as well as slough and callus buildup on the plantar surface. His MRI is scheduled for June 29. No purulent drainage or malodor appreciated today. 07/19/2021: The lateral foot wound has closed  and there is no tunnel connecting the plantar foot wound to that site. The plantar foot wound still probes quite deeply, however. There is some slough, eschar, and nonviable tissue accumulated in the wound bed. No malodorous drainage present. His MRI was performed yesterday and is consistent with fairly extensive osteomyelitis. Patient History Information obtained from Patient. Social History Never smoker, Marital Status - Married, Alcohol Use - Never, Drug Use - No History, Caffeine Use - Daily - T coffee. ea; Medical History Endocrine Patient has history of Type II Diabetes Hospitalization/Surgery History - Amuptation of 3rd,4th and 5th toes of Right foot;Oral Surgery;Anal Fissure surgery; Cholecystectomy. Objective Constitutional Slightly hypertensive. No acute  distress.. Vitals Time Taken: 3:10 PM, Height: 72 in, Weight: 312 lbs, BMI: 42.3, Temperature: 98.5 F, Pulse: 85 bpm, Respiratory Rate: 20 breaths/min, Blood Pressure: 145/88 mmHg, Capillary Blood Glucose: 90 mg/dl. Respiratory Normal work of breathing on room air.. General Notes: 07/19/2021: The lateral foot wound has closed and there is no tunnel connecting the plantar foot wound to that site. The plantar foot wound still probes quite deeply, however. There is some slough, eschar, and nonviable tissue accumulated in the wound bed. No malodorous drainage present. Integumentary (Hair, Skin) Wound #2 status is Open. Original cause of wound was Gradually Appeared. The date acquired was: 12/19/2016. The wound has been in treatment 2 weeks. The wound is located on the [place]. The wound measures 0.3cm length x 2cm width x 2.2cm depth; 0.471cm^2 area and 1.037cm^3 volume. There is Fat Layer (Subcutaneous Tissue) exposed. There is no tunneling noted, however, there is undermining starting at 12:00 and ending at 12:00 with a maximum distance of 0.9cm. There is a medium amount of serosanguineous drainage noted. The wound margin is thickened. There is large (67-100%) red, friable granulation within the wound bed. There is a small (1-33%) amount of necrotic tissue within the wound bed including Adherent Slough. Wound #3 status is Healed - Epithelialized. Original cause of wound was Gradually Appeared. The date acquired was: 05/24/2016. The wound has been in treatment 1 weeks. The wound is located on the Right,Lateral Foot. The wound measures 0cm length x 0cm width x 0cm depth; 0cm^2 area and 0cm^3 volume. There is no tunneling or undermining noted. There is a none present amount of drainage noted. The wound margin is distinct with the outline attached to the wound base. There is no granulation within the wound bed. There is no necrotic tissue within the wound bed. Assessment Active  Problems ICD-10 Type 2 diabetes mellitus with other diabetic neurological complication Other chronic osteomyelitis, right ankle and foot Non-pressure chronic ulcer of other part of right foot with necrosis of bone Procedures Wound #2 Pre-procedure diagnosis of Wound #2 is a Diabetic Wound/Ulcer of the Lower Extremity located on the Right,Lateral,Plantar Foot .Severity of Tissue Pre Debridement is: Fat layer exposed. There was a Excisional Skin/Subcutaneous Tissue Debridement with a total area of 0.6 sq cm performed by [name], MD. With the following instrument(s): Curette to remove Non-Viable tissue/material. Material removed includes Eschar, Subcutaneous Tissue, and Slough after achieving pain control using Lidocaine 4% T opical Solution. No specimens were taken. A time out was conducted at 15:40, prior to the start of the procedure. A Minimum amount of bleeding was controlled with Pressure. The procedure was tolerated well with a pain level of 0 throughout and a pain level of 0 following the procedure. Post Debridement Measurements: 0.3cm length x 2cm width x 2.2cm depth; 1.037cm^3 volume. Character of Wound/Ulcer Post Debridement  is improved. Severity of Tissue Post Debridement is: Fat layer exposed. Post procedure Diagnosis Wound #2: Same as Pre-Procedure Plan Follow-up Appointments: Return Appointment in 1 week. - Dr Celine Ahr Room 1 [facility] 07/29/21 3:30pm Bathing/ Shower/ Hygiene: Other Bathing/Shower/Hygiene Orders/Instructions: - Change dressing after bathing Off-Loading: Other: - Try to not stand on your feet too much Consults ordered were: Infectious Disease - Per Findings of Positive Osteomyelitis on the MRI (in Right Lateral Plantar Foot) ICD 10:G26.948 WOUND #2: - Foot Wound Laterality: Plantar, Right, Lateral Cleanser: Soap and Water Every Other Day/15 Days Discharge Instructions: May shower and wash wound with dial antibacterial soap and water prior to dressing  change. Cleanser: Wound Cleanser (Generic) Every Other Day/15 Days Discharge Instructions: Cleanse the wound with wound cleanser prior to applying a clean dressing using gauze sponges, not tissue or cotton balls. Peri-Wound Care: cotton tipped applicators (Generic) Every Other Day/15 Days Topical: Gentamicin Every Other Day/15 Days Discharge Instructions: As directed by physician Prim Dressing: KerraCel Ag Gelling Fiber Dressing, 4x5 in (silver alginate) (Generic) Every Other Day/15 Days ary Discharge Instructions: Apply silver alginate to wound bed as instructed Secured With: Paper T ape, 2x10 (in/yd) (Generic) Every Other Day/15 Days Discharge Instructions: Secure dressing with tape as directed. Com pression Wrap: Kerlix Roll 4.5x3.1 (in/yd) (Generic) Every Other Day/15 Days Discharge Instructions: Apply Kerlix and Coban compression as directed. 07/19/2021: The lateral foot wound has closed and there is no tunnel connecting the plantar foot wound to that site. The plantar foot wound still probes quite deeply, however. There is some slough, eschar, and nonviable tissue accumulated in the wound bed. No malodorous drainage present. I used a curette to debride the nonviable tissue, slough, and eschar from the plantar foot wound. We will continue to pack this with gentamicin and silver alginate. Due to the findings of osteomyelitis, we will place a referral to infectious disease for evaluation for long-term antibiotic therapy. Follow-up in 1 week. Electronic Signature(s) Signed: 07/19/2021 4:04:17 PM By: [name] MD FACS Entered By: [name] on 07/19/2021 16:04:17 -------------------------------------------------------------------------------- HxROS Details Patient Name: Date of Service: [name], [name] 07/19/2021 3:15 PM Medical Record Number: [id number] Patient Account Number: [id number] Date of Birth/Sex: Treating RN: 1970/12/24 [name] Primary Care  Provider: [name] Other Clinician: Referring Provider: Treating Provider/Extender: [name] in Treatment: 2 Information Obtained From Patient Endocrine Medical History: Positive for: Type II Diabetes Immunizations Pneumococcal Vaccine: Received Pneumococcal Vaccination: Yes Received Pneumococcal Vaccination On or After 60th Birthday: No Implantable Devices None Hospitalization / Surgery History Type of Hospitalization/Surgery Amuptation of 3rd,4th and 5th toes of Right foot;Oral Surgery;Anal Fissure surgery; Cholecystectomy Family and Social History Never smoker; Marital Status - Married; Alcohol Use: Never; Drug Use: No History; Caffeine Use: Daily - T coffee; Financial Concerns: No; Food, ea; Clothing or Shelter Needs: No; Support System Lacking: No; Transportation Concerns: No Engineer, maintenance) Signed: 07/19/2021 4:38:32 PM By: [name] MD FACS Signed: 07/19/2021 6:10:44 PM By: Dellie Catholic RN Entered By: [name] on 07/19/2021 16:02:34 -------------------------------------------------------------------------------- SuperBill Details Patient Name: Date of Service: [name] [place], [name] 07/19/2021 Medical Record Number: [id number] Patient Account Number: [id number] Date of Birth/Sex: Treating RN: [birth year] 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 B71.696 Other chronic osteomyelitis, right ankle and foot L97.514 Non-pressure  chronic ulcer of other part of right foot with necrosis of bone Facility Procedures CPT4 Code: [id number] Description: 68616 - DEB SUBQ TISSUE 20 SQ CM/< ICD-10 Diagnosis Description L97.514 Non-pressure chronic ulcer of other  part of right foot with necrosis of bone Modifier: Quantity: 1 Physician Procedures : CPT4 Code Description Modifier [id number] - WC PHYS LEVEL 4 - EST PT 25 ICD-10 Diagnosis Description L97.514 Non-pressure chronic ulcer of other part of right foot with necrosis of bone M86.671 Other chronic osteomyelitis, right ankle and foot  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

## 2021-07-24 ENCOUNTER — Ambulatory Visit: Payer: BLUE CROSS/BLUE SHIELD | Admitting: Podiatry

## 2021-07-29 ENCOUNTER — Encounter (HOSPITAL_BASED_OUTPATIENT_CLINIC_OR_DEPARTMENT_OTHER): Payer: BLUE CROSS/BLUE SHIELD | Attending: General Surgery | Admitting: General Surgery

## 2021-07-29 DIAGNOSIS — L97514 Non-pressure chronic ulcer of other part of right foot with necrosis of bone: Secondary | ICD-10-CM | POA: Insufficient documentation

## 2021-07-29 DIAGNOSIS — E11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ical complication: Secondary | ICD-10-CM | POA: Diagnosis not present

## 2021-07-29 DIAGNOSIS — E1169 Type 2 diabetes mellitus with other specified complication: Secondary | ICD-10-CM | POA: Diagnosis not present

## 2021-07-29 DIAGNOSIS — E11621 Type 2 diabetes mellitus with foot ulcer: Secondary | ICD-10-CM | POA: Diagnosis not present

## 2021-07-29 DIAGNOSIS — Z89421 Acquired absence of other right toe(s): Secondary | ICD-10-CM | POA: Diagnosis not present

## 2021-07-29 DIAGNOSIS — M86671 Other chronic osteomyelitis, right ankle and foot: Secondary | ICD-10-CM | POA: Diagnosis not present

## 2021-07-29 NOTE — Progress Notes (Addendum)
[name], Kartel M. ([id number]) Visit Report for 07/29/2021 Chief Complaint Document Details Patient Name: Date of Service: [place], [place] [name] 07/29/2021 3:30 PM Medical Record Number: [id number] Patient Account Number: [id number] Date of Birth/Sex: Treating RN: 09-20-1970 (51 y.o. M) Primary Care Provider: [name] Other Clinician: Referring Provider: Treating Provider/Extender: [name] in Treatment: 3 Information Obtained from: Patient Chief Complaint Patients presents for treatment of an open diabetic ulcer Electronic Signature(s) Signed: 07/29/2021 4:36:17 PM By: [name] MD FACS Entered By: [name] on 07/29/2021 16:36:16 -------------------------------------------------------------------------------- Debridement Details Patient Name: Date of Service: [name], [name] 07/29/2021 3:30 PM Medical Record Number: [id number] Patient Account Number: [id number] Date of Birth/Sex: Treating RN: 06/13/1970 (51 y.o. [name] Primary Care Provider: [name] Other Clinician: Referring Provider: Treating Provider/Extender: [name] in Treatment: 3 Debridement Performed for Assessment: Wound #2 Right,Lateral,Plantar Foot Performed By: Physician [name], MD Debridement Type: Debridement Severity of Tissue Pre Debridement: Necrosis of bone Level of Consciousness (Pre-procedure): Awake and Alert Pre-procedure Verification/Time Out Yes - 16:40 Taken: Start Time: 16:43 Pain Control: Lidocaine 4% T opical Solution T Area Debrided (L x W): otal 1 (cm) x 2.2 (cm) = 2.2 (cm) Tissue and other material debrided: Viable, Non-Viable, Callus, Slough, Subcutaneous, Slough Level: Skin/Subcutaneous Tissue Debridement Description: Excisional Instrument: Curette Bleeding: Minimum Hemostasis Achieved: Pressure Procedural Pain: 1 Post Procedural Pain: 1 Response to Treatment: Procedure was  tolerated well Level of Consciousness (Post- Awake and Alert procedure): Post Debridement Measurements of Total Wound Length: (cm) 0.3 Width: (cm) 2.2 Depth: (cm) 2.3 Volume: (cm) 1.192 Character of Wound/Ulcer Post Debridement: Improved Severity of Tissue Post Debridement: Necrosis of bone Post Procedure Diagnosis Same as Pre-procedure Electronic Signature(s) Signed: 07/29/2021 4:54:42 PM By: [name] MD FACS Signed: 07/29/2021 5:47:34 PM By: [name] RN, BSN Entered By: [name] on 07/29/2021 16:45:37 -------------------------------------------------------------------------------- HPI Details Patient Name: Date of Service: [name], [name] 07/29/2021 3:30 PM Medical Record Number: [id number] Patient Account Number: [id number] Date of Birth/Sex: Treating RN: 1970-08-21 (51 y.o. 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 The most recent amputation was in August 2022. At that operation, antibiotic beads were placed in the wound. He has been managed by podiatry for his procedures and management of his wounds. He has been in a Water engineer. He is on oral doxycycline. They have been using Betadine wet to dry dressings along with Iodosorb. The patient states that when he thinks the wound is getting too dry, he applies topical Neosporin. At his last visit, on June 7 of this year, the podiatrist determined that he felt the wound was stalled and referred him to wound care for additional evaluation and management. An MRI has been ordered, but not yet scheduled or performed. Pathology from his operation in  August 2022 demonstrated findings consistent with chronic osteomyelitis. Today, there is a large irregular wound on the plantar surface of his right foot, at about the level of the fifth metatarsal head. This tracks through to a pinpoint opening on the dorsal lateral portion of his foot. The intake nurse reported purulent drainage. There is some malodor from the wound. No frank necrosis  identified. 07/11/2021: Today, the wounds do not connect. I attempted multiple times from various directions and the shared tunnel is no longer open. He has some slough accumulation on the dorsal part of his foot as well as slough and callus buildup on the plantar surface. His MRI is scheduled for June 29. No purulent drainage or malodor appreciated today. 07/19/2021: The lateral foot wound has closed and there is no tunnel connecting the plantar foot wound to that site. The plantar foot wound still probes quite deeply, however. There is some slough, eschar, and nonviable tissue accumulated in the wound bed. No malodorous drainage present. His MRI was performed yesterday and is consistent with fairly extensive osteomyelitis. 07/29/2021: The patient has an appointment with infectious disease on July 18 to treat his osteomyelitis. The plantar wound still probes quite deeply, approaching bone. The orifice has narrowed quite substantially, however, making it more difficult for him to pack. Electronic Signature(s) Signed: 07/29/2021 4:49:52 PM By: [name] MD FACS Entered By: [name] on 07/29/2021 16:49:52 -------------------------------------------------------------------------------- Physical Exam Details Patient Name: Date of Service: [name], [name] 07/29/2021 3:30 PM Medical Record Number: [id number] Patient Account Number: [id number] Date of Birth/Sex: Treating RN: 12-May-1970 (51 y.o. M) Primary Care Provider: [name] Other Clinician: Referring Provider: Treating Provider/Extender:  [name] in Treatment: 3 Constitutional Hypertensive, asymptomatic. . . . No acute distress.Marland Kitchen Respiratory Normal work of breathing on room air.. Notes 07/29/2021: The plantar wound still probes quite deeply, approaching bone. The orifice has narrowed quite substantially, however, making it more difficult for him to pack. Electronic Signature(s) Signed: 07/29/2021 4:51:00 PM By: [name] MD FACS Entered By: [name] on 07/29/2021 16:50:59 -------------------------------------------------------------------------------- Physician Orders Details Patient Name: Date of Service: [name], [name] 07/29/2021 3:30 PM Medical Record Number: [id number] Patient Account Number: [id number] Date of Birth/Sex: Treating RN: 19-Sep-1970 (51 y.o. 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 - Dr Celine Ahr Room 1 [facility] 08/05/21 3:30pm Return A Bathing/ Shower/ Hygiene Other Bathing/Shower/Hygiene Orders/Instructions: - Change dressing after bathing Off-Loading Other: - Try to not stand on your feet too much Wound Treatment Wound #2 - Foot Wound Laterality: Plantar, Right, Lateral Cleanser: Soap and Water Every Other Day/15 Days Discharge Instructions: May shower and wash wound with dial antibacterial soap and water prior to dressing change. Cleanser: Wound Cleanser (Generic) Every Other Day/15 Days Discharge Instructions: Cleanse the wound with wound cleanser prior to applying a clean dressing  using gauze sponges, not tissue or cotton balls. Peri-Wound Care: cotton tipped applicators (Generic) Every Other Day/15 Days Topical: Gentamicin Every Other Day/15 Days Discharge Instructions: As directed by physician Prim Dressing: KerraCel Ag Gelling Fiber Dressing, 4x5 in (silver alginate) (Generic) Every Other Day/15 Days ary Discharge Instructions: Apply silver alginate to wound bed as instructed Secured With: Paper Tape, 2x10 (in/yd) (Generic) Every Other Day/15 Days Discharge Instructions: Secure dressing with tape as directed. Compression Wrap: Kerlix Roll 4.5x3.1 (in/yd) (Generic) Every Other Day/15 Days Discharge Instructions: Apply Kerlix and Coban compression as directed. Electronic Signature(s) Signed: 07/29/2021 4:54:42 PM By: [name] MD FACS Entered By: [name] on 07/29/2021  16:51:26 -------------------------------------------------------------------------------- Problem List Details Patient Name: Date of Service: [name], [name] 07/29/2021 3:30 PM Medical Record Number: [id number] Patient Account Number: [id number] Date of Birth/Sex: Treating RN: 04-09-1970 (51 y.o. 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 07/29/2021 3:30 PM Medical Record Number: [id number] Patient Account Number: [id number] Date of Birth/Sex: Treating RN: Jan 29, 1970 (51 y.o. 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 The most recent amputation was in August 2022. At that operation, antibiotic beads were placed in the wound. He has been managed by podiatry for his procedures and management of his wounds. He has been in a Water engineer. He is on oral doxycycline. They have been using Betadine wet to dry dressings along with Iodosorb. The patient states that when he thinks the wound is getting too dry, he applies topical Neosporin. At his last visit, on June 7 of this year, the podiatrist determined that he felt the wound was stalled and referred him to wound care for additional evaluation and management. An MRI has been ordered, but not yet scheduled or performed. Pathology from his operation in August 2022 demonstrated findings consistent with chronic osteomyelitis. Today, there is a large irregular wound on the plantar surface of his right foot, at about the level of the fifth metatarsal head. This tracks through to a pinpoint opening on the dorsal lateral portion of his foot. The intake nurse reported purulent drainage. There is some malodor from the wound. No frank necrosis identified. 07/11/2021: Today, the wounds do not connect. I attempted multiple times from various directions and the shared  tunnel is no longer open. He has some slough accumulation on the dorsal part of his foot as well as slough and callus buildup on the plantar surface. His MRI is scheduled for June 29. No purulent drainage or malodor appreciated today. 07/19/2021: The lateral foot wound has closed and there is no tunnel connecting the plantar foot wound to that site. The plantar foot wound still probes quite deeply, however. There is some slough, eschar, and nonviable tissue accumulated in the wound bed. No malodorous drainage present. His MRI was performed yesterday and is consistent with fairly extensive osteomyelitis. 07/29/2021: The patient has an appointment with infectious disease on July 18 to treat his osteomyelitis. The plantar wound still probes quite deeply, approaching bone. The orifice has narrowed quite substantially, however, making it more difficult for him to pack. Patient History Information obtained from Patient. Social History Never smoker, Marital Status - Married, Alcohol Use - Never, Drug Use -  No History, Caffeine Use - Daily - T coffee. ea; Medical History Endocrine Patient has history of Type II Diabetes Hospitalization/Surgery History - Amuptation of 3rd,4th and 5th toes of Right foot;Oral Surgery;Anal Fissure surgery; Cholecystectomy. Objective Constitutional Hypertensive, asymptomatic. No acute distress.. Vitals Time Taken: 4:18 PM, Height: 72 in, Weight: 312 lbs, BMI: 42.3, Temperature: 98.5 F, Pulse: 82 bpm, Respiratory Rate: 18 breaths/min, Blood Pressure: 153/93 mmHg, Capillary Blood Glucose: 112 mg/dl. General Notes: glucose per pt report this am Respiratory Normal work of breathing on room air.. General Notes: 07/29/2021: The plantar wound still probes quite deeply, approaching bone. The orifice has narrowed quite substantially, however, making it more difficult for him to pack. Integumentary (Hair, Skin) Wound #2 status is Open. Original cause of wound was Gradually  Appeared. The date acquired was: 12/19/2016. The wound has been in treatment 3 weeks. The wound is located on the [place]. The wound measures 0.3cm length x 2.2cm width x 2.3cm depth; 0.518cm^2 area and 1.192cm^3 volume. There is Fat Layer (Subcutaneous Tissue) exposed. There is no tunneling noted, however, there is undermining starting at 12:00 and ending at 12:00 with a maximum distance of 2.3cm. There is a medium amount of serosanguineous drainage noted. The wound margin is thickened. There is large (67-100%) red, friable granulation within the wound bed. There is a small (1-33%) amount of necrotic tissue within the wound bed. Assessment Active Problems ICD-10 Type 2 diabetes mellitus with other diabetic neurological complication Other chronic osteomyelitis, right ankle and foot Non-pressure chronic ulcer of other part of right foot with necrosis of bone Procedures Wound #2 Pre-procedure diagnosis of Wound #2 is a Diabetic Wound/Ulcer of the Lower Extremity located on the Right,Lateral,Plantar Foot .Severity of Tissue Pre Debridement is: Necrosis of bone. There was a Excisional Skin/Subcutaneous Tissue Debridement with a total area of 2.2 sq cm performed by [name], MD. With the following instrument(s): Curette to remove Viable and Non-Viable tissue/material. Material removed includes Callus, Subcutaneous Tissue, and Slough after achieving pain control using Lidocaine 4% Topical Solution. No specimens were taken. A time out was conducted at 16:40, prior to the start of the procedure. A Minimum amount of bleeding was controlled with Pressure. The procedure was tolerated well with a pain level of 1 throughout and a pain level of 1 following the procedure. Post Debridement Measurements: 0.3cm length x 2.2cm width x 2.3cm depth; 1.192cm^3 volume. Character of Wound/Ulcer Post Debridement is improved. Severity of Tissue Post Debridement is: Necrosis of bone. Post  procedure Diagnosis Wound #2: Same as Pre-Procedure Plan Follow-up Appointments: Return Appointment in 1 week. - Dr Celine Ahr Room 1 [facility] 08/05/21 3:30pm Bathing/ Shower/ Hygiene: Other Bathing/Shower/Hygiene Orders/Instructions: - Change dressing after bathing Off-Loading: Other: - Try to not stand on your feet too much WOUND #2: - Foot Wound Laterality: Plantar, Right, Lateral Cleanser: Soap and Water Every Other Day/15 Days Discharge Instructions: May shower and wash wound with dial antibacterial soap and water prior to dressing change. Cleanser: Wound Cleanser (Generic) Every Other Day/15 Days Discharge Instructions: Cleanse the wound with wound cleanser prior to applying a clean dressing using gauze sponges, not tissue or cotton balls. Peri-Wound Care: cotton tipped applicators (Generic) Every Other Day/15 Days Topical: Gentamicin Every Other Day/15 Days Discharge Instructions: As directed by physician Prim Dressing: KerraCel Ag Gelling Fiber Dressing, 4x5 in (silver alginate) (Generic) Every Other Day/15 Days ary Discharge Instructions: Apply silver alginate to wound bed as instructed Secured With: Paper T ape, 2x10 (in/yd) (Generic) Every Other Day/15 Days Discharge  Instructions: Secure dressing with tape as directed. Com pression Wrap: Kerlix Roll 4.5x3.1 (in/yd) (Generic) Every Other Day/15 Days Discharge Instructions: Apply Kerlix and Coban compression as directed. 07/29/2021: The plantar wound still probes quite deeply, approaching bone. The orifice has narrowed quite substantially, however, making it more difficult for him to pack. I used a curette to debride some periwound callus and senescent epithelium from the wound orifice to try and help keep it open for packing. I also debrided nonviable subcutaneous tissue from the wound itself. We will continue using gentamicin with silver alginate packing. Follow-up in 1 week. Electronic Signature(s) Signed: 07/29/2021 4:54:05 PM By:  [name] MD FACS Entered By: [name] on 07/29/2021 16:54:05 -------------------------------------------------------------------------------- HxROS Details Patient Name: Date of Service: [name], [name] 07/29/2021 3:30 PM Medical Record Number: [id number] Patient Account Number: [id number] Date of Birth/Sex: Treating RN: [birth year] (51 y.o. M) Primary Care Provider: [name] Other Clinician: Referring Provider: Treating Provider/Extender: [name] in Treatment: 3 Information Obtained From Patient Endocrine Medical History: Positive for: Type II Diabetes Immunizations Pneumococcal Vaccine: Received Pneumococcal Vaccination: Yes Received Pneumococcal Vaccination On or After 60th Birthday: No Implantable Devices None Hospitalization / Surgery History Type of Hospitalization/Surgery Amuptation of 3rd,4th and 5th toes of Right foot;Oral Surgery;Anal Fissure surgery; Cholecystectomy Family and Social History Never smoker; Marital Status - Married; Alcohol Use: Never; Drug Use: No History; Caffeine Use: Daily - T coffee; Financial Concerns: No; Food, ea; Clothing or Shelter Needs: No; Support System Lacking: No; Transportation Concerns: No Electronic Signature(s) Signed: 07/29/2021 4:54:42 PM By: [name] MD FACS Entered By: [name] on 07/29/2021 16:49:59 -------------------------------------------------------------------------------- SuperBill Details Patient Name: Date of Service: [name], [name] 07/29/2021 Medical Record Number: [id number] Patient Account Number: [id number] Date of Birth/Sex: Treating RN: 11-Feb-1970 (51 y.o. M) Primary Care Provider: [name] Other Clinician: Referring Provider: Treating Provider/Extender: [name] in Treatment: 3 Diagnosis Coding ICD-10 Codes Code Description 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ical  complication S56.812 Other chronic osteomyelitis, right ankle and foot L97.514 Non-pressure chronic ulcer of other part of right foot with necrosis of bone Facility Procedures CPT4 Code: [id number] Description: 49449 - DEB SUBQ TISSUE 20 SQ CM/< ICD-10 Diagnosis Description L97.514 Non-pressure chronic ulcer of other part of right foot with necrosis of bone Modifier: Quantity: 1 Physician Procedures : CPT4 Code Description Modifier [id number] - WC PHYS LEVEL 3 - EST PT 25 ICD-10 Diagnosis Description L97.514 Non-pressure chronic ulcer of other part of right foot with necrosis of bone M86.671 Other chronic osteomyelitis, right ankle and foot  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

## 2021-07-29 NOTE — Progress Notes (Signed)
[name], [name] ([id number]) Visit Report for 07/29/2021 Arrival Information Details Patient Name: Date of Service: [name], [name] M. 07/29/2021 3:30 PM Medical Record Number: [id number] Patient Account Number: [id number] Date of Birth/Sex: Treating RN: [birth year] [name] Primary Care Dajuana Palen: [name] Other Clinician: Referring Cara Thaxton: Treating Ahmon Tosi/Extender: [name] in Treatment: 3 Visit Information History Since Last Visit Added or deleted any medications: No Patient Arrived: Ambulatory Any new allergies or adverse reactions: No Arrival Time: 16:13 Had a fall or experienced change in No Accompanied By: self activities of daily living that may affect Transfer Assistance: None risk of falls: Patient Identification Verified: Yes Signs or symptoms of abuse/neglect since last visito No Secondary Verification Process Completed: Yes Hospitalized since last visit: No Patient Requires Transmission-Based Precautions: No Implantable device outside of the clinic excluding No Patient Has Alerts: Yes cellular tissue based products placed in the center Patient Alerts: ABI R 1.17 since last visit: ABI L 1.29 Has Dressing in Place as Prescribed: Yes R TBI 1.03 Pain Present Now: Yes L TBI 0.77 Electronic Signature(s) Signed: 07/29/2021 5:47:34 PM By: [name] RN, BSN Entered By: [name] on 07/29/2021 16:17:33 -------------------------------------------------------------------------------- Encounter Discharge Information Details Patient Name: Date of Service: [name], [name] M. 07/29/2021 3:30 PM Medical Record Number: [id number] Patient Account Number: [id number] Date of Birth/Sex: Treating RN: [birth year] [name] Primary Care [name]: [name] Other Clinician: Referring Revan Gendron: Treating Vada Yellen/Extender: [name] in Treatment: 3 Encounter Discharge  Information Items Post Procedure Vitals Discharge Condition: Stable Temperature (F): 98.5 Ambulatory Status: Ambulatory Pulse (bpm): 82 Discharge Destination: Home Respiratory Rate (breaths/min): 18 Transportation: Private Auto Blood Pressure (mmHg): 153/93 Accompanied By: self Schedule Follow-up Appointment: Yes Clinical Summary of Care: Patient Declined Electronic Signature(s) Signed: 07/29/2021 5:47:34 PM By: [name] RN, BSN Entered By: [name] on 07/29/2021 16:55:32 -------------------------------------------------------------------------------- Lower Extremity Assessment Details Patient Name: Date of Service: [name] [place], [name] M. 07/29/2021 3:30 PM Medical Record Number: [id number] Patient Account Number: [id number] Date of Birth/Sex: Treating RN: 19-Aug-1970 [name] Primary Care Lisa Milian: [name] Other Clinician: Referring Mame Twombly: Treating Ulis Kaps/Extender: [name] in Treatment: 3 Edema Assessment Assessed: [Left: No] [Right: No] Edema: [Left: N] [Right: o] Calf Left: Right: Point of Measurement: From Medial Instep 40.5 cm Ankle Left: Right: Point of Measurement: From Medial Instep 25 cm Vascular Assessment Pulses: Dorsalis Pedis Palpable: [Right:Yes] Electronic Signature(s) Signed: 07/29/2021 5:47:34 PM By: [name] RN, BSN Entered By: [name] on 07/29/2021 16:22:39 -------------------------------------------------------------------------------- Multi Wound Chart Details Patient Name: Date of Service: [name] IN, [name] M. 07/29/2021 3:30 PM Medical Record Number: [id number] Patient Account Number: [id number] Date of Birth/Sex: Treating RN: [birth year] (51 y.o. M) Primary Care Natthew Marlatt: [name] Other Clinician: Referring Bennett Vanscyoc: Treating Dalicia Kisner/Extender: [name] in Treatment: 3 Vital Signs Height(in): 72 Capillary Blood  Glucose(mg/dl): 112 Weight(lbs): 312 Pulse(bpm): 55 Body Mass Index(BMI): 42.3 Blood Pressure(mmHg): 153/93 Temperature(F): 98.5 Respiratory Rate(breaths/min): 18 Photos: [2:Right, Lateral, Plantar Foot] [N/A:N/A N/A] Wound Location: [2:Gradually Appeared] [N/A:N/A] Wounding Event: [2:Diabetic Wound/Ulcer of the Lower] [N/A:N/A] Primary Etiology: [2:Extremity Type II Diabetes] [N/A:N/A] Comorbid History: [2:12/19/2016] [N/A:N/A] Date Acquired: [2:3] [N/A:N/A] Weeks of Treatment: [2:Open] [N/A:N/A] Wound Status: [2:No] [N/A:N/A] Wound Recurrence: [2:0.3x2.2x2.3] [N/A:N/A] Measurements L x W x D (cm) [2:0.518] [N/A:N/A] A (cm) : rea [2:1.192] [N/A:N/A] Volume (cm) : [2:85.70%] [N/A:N/A] % Reduction in A rea: [2:93.40%] [N/A:N/A] % Reduction in Volume: [2:12]  Starting Position 1 (o'clock): [2:12] Ending Position 1 (o'clock): [2:2.3] Maximum Distance 1 (cm): [2:Yes] [N/A:N/A] Undermining: [2:Grade 2] [N/A:N/A] Classification: [2:Medium] [N/A:N/A] Exudate A mount: [2:Serosanguineous] [N/A:N/A] Exudate Type: [2:red, brown] [N/A:N/A] Exudate Color: [2:Thickened] [N/A:N/A] Wound Margin: [2:Large (67-100%)] [N/A:N/A] Granulation A mount: [2:Red, Friable] [N/A:N/A] Granulation Quality: [2:Small (1-33%)] [N/A:N/A] Necrotic A mount: [2:Fat Layer (Subcutaneous Tissue): Yes N/A] Exposed Structures: [2:Fascia: No Tendon: No Muscle: No Joint: No Bone: No Small (1-33%)] [N/A:N/A] Treatment Notes Electronic Signature(s) Signed: 07/29/2021 4:36:11 PM By: [name] MD FACS Entered By: [name] on 07/29/2021 16:36:11 -------------------------------------------------------------------------------- Multi-Disciplinary Care Plan Details Patient Name: Date of Service: [name] IN, [name] M. 07/29/2021 3:30 PM Medical Record Number: [id number] Patient Account Number: [id number] Date of Birth/Sex: Treating RN: September 21, 1970 [name] Primary Care Jadakiss Barish: [name] Other Clinician: Referring Chancie Lampert: Treating Emera Bussie/Extender: [name] in Treatment: 3 Multidisciplinary Care Plan reviewed with physician Active Inactive Nutrition Nursing Diagnoses: Impaired glucose control: actual or potential Potential for alteratiion in Nutrition/Potential for imbalanced nutrition Goals: Patient/caregiver will maintain therapeutic glucose control Date Initiated: 07/29/2021 Target Resolution Date: 08/26/2021 Goal Status: Active Interventions: Assess patient nutrition upon admission and as needed per policy Provide education on elevated blood sugars and impact on wound healing Treatment Activities: Patient referred to Primary Care Physician for further nutritional evaluation : 07/29/2021 Notes: Osteomyelitis Nursing Diagnoses: Infection: osteomyelitis Knowledge deficit related to disease process and management Goals: Patient/caregiver will verbalize understanding of disease process and disease management Date Initiated: 07/29/2021 Target Resolution Date: 08/26/2021 Goal Status: Active Patient's osteomyelitis will resolve Date Initiated: 07/29/2021 Target Resolution Date: 08/26/2021 Goal Status: Active Interventions: Assess for signs and symptoms of osteomyelitis resolution every visit Provide education on osteomyelitis Treatment Activities: Consult for HBO : 07/29/2021 Notes: Wound/Skin Impairment Nursing Diagnoses: Impaired tissue integrity Goals: Patient/caregiver will verbalize understanding of skin care regimen Date Initiated: 07/04/2021 Target Resolution Date: 08/16/2021 Goal Status: Active Interventions: Assess ulceration(s) every visit Treatment Activities: Skin care regimen initiated : 07/04/2021 Notes: Electronic Signature(s) Signed: 07/29/2021 5:47:34 PM By: [name] RN, BSN Entered By: [name] on 07/29/2021  16:33:04 -------------------------------------------------------------------------------- Pain Assessment Details Patient Name: Date of Service: [name] IN, [name] M. 07/29/2021 3:30 PM Medical Record Number: [id number] Patient Account Number: [id number] Date of Birth/Sex: Treating RN: Jul 10, 1970 [name] Primary Care Anoushka Divito: [name] Other Clinician: Referring Nicola Quesnell: Treating Manning Luna/Extender: [name] in Treatment: 3 Active Problems Location of Pain Severity and Description of Pain Patient Has Paino Yes Site Locations Pain Location: Pain Location: Pain in Ulcers With Dressing Change: No Duration of the Pain. Constant / Intermittento Intermittent Rate the pain. Current Pain Level: 1 Worst Pain Level: 7 Character of Pain Describe the Pain: Aching Pain Management and Medication Current Pain Management: Rest: Yes Is the Current Pain Management Adequate: Adequate How does your wound impact your activities of daily livingo Sleep: No Bathing: No Appetite: No Relationship With Others: No Bladder Continence: No Emotions: No Bowel Continence: No Work: No Toileting: No Drive: No Dressing: No Hobbies: No Electronic Signature(s) Signed: 07/29/2021 5:47:34 PM By: [name] RN, BSN Entered By: [name] on 07/29/2021 16:20:29 -------------------------------------------------------------------------------- Patient/Caregiver Education Details Patient Name: Date of Service: [name], [name] M. 7/10/2023andnbsp3:30 PM Medical Record Number: [id number] Patient Account Number: [id number] Date of Birth/Gender: Treating RN: 25-Jul-1970 [name] Primary Care Physician: [name] Other Clinician: Referring Physician: Treating Physician/Extender: [name] in Treatment: 3 Education Assessment Education  Provided To: Patient Education Topics  Provided Elevated Blood Sugar/ Impact on Healing: Methods: Explain/Verbal Responses: Reinforcements needed, State content correctly Infection: Methods: Explain/Verbal Responses: Reinforcements needed, State content correctly Offloading: Methods: Explain/Verbal Responses: Reinforcements needed, State content correctly Wound/Skin Impairment: Methods: Explain/Verbal Responses: Reinforcements needed, State content correctly Electronic Signature(s) Signed: 07/29/2021 5:47:34 PM By: [name] RN, BSN Entered By: [name] on 07/29/2021 16:33:41 -------------------------------------------------------------------------------- Wound Assessment Details Patient Name: Date of Service: [name], [name] M. 07/29/2021 3:30 PM Medical Record Number: [id number] Patient Account Number: [id number] Date of Birth/Sex: Treating RN: 07-03-1970 [name] Primary Care Michiko Lineman: [name] Other Clinician: Referring Jaylia Pettus: Treating Laporche Martelle/Extender: [name] in Treatment: 3 Wound Status Wound Number: 2 Primary Etiology: Diabetic Wound/Ulcer of the Lower Extremity Wound Location: Right, Lateral, Plantar Foot Wound Status: Open Wounding Event: Gradually Appeared Comorbid History: Type II Diabetes Date Acquired: 12/19/2016 Weeks Of Treatment: 3 Clustered Wound: No Photos Wound Measurements Length: (cm) 0.3 Width: (cm) 2.2 Depth: (cm) 2.3 Area: (cm) 0.518 Volume: (cm) 1.192 % Reduction in Area: 85.7% % Reduction in Volume: 93.4% Epithelialization: Small (1-33%) Tunneling: No Undermining: Yes Starting Position (o'clock): 12 Ending Position (o'clock): 12 Maximum Distance: (cm) 2.3 Wound Description Classification: Grade 3 Wagner Verification: MRI Wound Margin: Thickened Exudate Amount: Medium Exudate Type: Serosanguineous Exudate Color: red, brown Foul Odor After Cleansing: No Slough/Fibrino Yes Wound Bed Granulation  Amount: Large (67-100%) Exposed Structure Granulation Quality: Red, Friable Fascia Exposed: No Necrotic Amount: Small (1-33%) Fat Layer (Subcutaneous Tissue) Exposed: Yes Tendon Exposed: No Muscle Exposed: No Joint Exposed: No Bone Exposed: No Treatment Notes Wound #2 (Foot) Wound Laterality: Plantar, Right, Lateral Cleanser Soap and Water Discharge Instruction: May shower and wash wound with dial antibacterial soap and water prior to dressing change. Wound Cleanser Discharge Instruction: Cleanse the wound with wound cleanser prior to applying a clean dressing using gauze sponges, not tissue or cotton balls. Peri-Wound Care cotton tipped applicators Topical Gentamicin Discharge Instruction: As directed by physician Primary Dressing KerraCel Ag Gelling Fiber Dressing, 4x5 in (silver alginate) Discharge Instruction: Apply silver alginate to wound bed as instructed Secondary Dressing Secured With Paper Tape, 2x10 (in/yd) Discharge Instruction: Secure dressing with tape as directed. Compression Wrap Kerlix Roll 4.5x3.1 (in/yd) Discharge Instruction: Apply Kerlix and Coban compression as directed. Compression Stockings Add-Ons Electronic Signature(s) Signed: 07/29/2021 5:47:34 PM By: [name] RN, BSN Entered By: [name] on 07/29/2021 16:40:42 -------------------------------------------------------------------------------- Vitals Details Patient Name: Date of Service: [name] IN, [name] M. 07/29/2021 3:30 PM Medical Record Number: [id number] Patient Account Number: [id number] Date of Birth/Sex: Treating RN: 08-Jun-1970 [name] Primary Care Jaeshawn Silvio: [name] Other Clinician: Referring Le Ferraz: Treating Kailany Dinunzio/Extender: [name] in Treatment: 3 Vital Signs Time Taken: 16:18 Temperature (F): 98.5 Height (in): 72 Pulse (bpm): 82 Weight (lbs): 312 Respiratory Rate (breaths/min): 18 Body Mass Index  (BMI): 42.3 Blood Pressure (mmHg): 153/93 Capillary Blood Glucose (mg/dl): 112 Reference Range: 80 - 120 mg / dl Notes glucose per pt report this am Electronic Signature(s) Signed: 07/29/2021 5:47:34 PM By: [name] RN, BSN Entered By: [name] on 07/29/2021 16:19:50

## 2021-07-31 ENCOUNTER — Other Ambulatory Visit: Payer: Self-pay | Admitting: Family Medicine

## 2021-08-01 ENCOUNTER — Encounter: Payer: Self-pay | Admitting: Family Medicine

## 2021-08-01 ENCOUNTER — Ambulatory Visit: Payer: BLUE CROSS/BLUE SHIELD | Admitting: Family Medicine

## 2021-08-01 VITALS — BP 126/62 | HR 74 | Temp 98.2°F | Ht 73.0 in | Wt 310.8 lb

## 2021-08-01 DIAGNOSIS — E1169 Type 2 diabetes mellitus with other specified complication: Secondary | ICD-10-CM | POA: Diagnosis not present

## 2021-08-01 DIAGNOSIS — E785 Hyperlipidemia, unspecified: Secondary | ICD-10-CM | POA: Diagnosis not present

## 2021-08-01 DIAGNOSIS — E11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ical complication: Secondary | ICD-10-CM | POA: Diagnosis not present

## 2021-08-01 DIAGNOSIS — I10 Essential (primary) hypertension: Secondary | ICD-10-CM

## 2021-08-01 MED ORDER — TRESIBA FLEXTOUCH 200 UNIT/ML ~~LOC~~ SOPN: 80.0000 [IU] | PEN_INJECTOR | Freq: Every day | SUBCUTANEOUS | 3 refills | Status: DC

## 2021-08-01 NOTE — Progress Notes (Signed)
Phone [phone] In person visit   Subjective:   [name] is a 51 y.o. year old very pleasant male patient who presents for/with See problem oriented charting Chief Complaint  Patient presents with   Follow-up   Hyperlipidemia   Diabetes   Hypertension   Past Medical History-  Patient Active Problem List   Diagnosis Date Noted   Morbid obesity ([place]) 03/08/2015    Priority: High   Type II diabetes mellitus with neurological manifestations ([place]) 03/07/2015    Priority: High   Diabetic retinopathy ([place]) 01/25/2018    Priority: Medium    CKD (chronic kidney disease), stage II 11/17/2016    Priority: Medium    Diabetic neuropathy ([place]) 11/17/2016    Priority: Medium    Elevated LFTs     Priority: Medium    Hyperlipidemia associated with type 2 diabetes mellitus ([place]) 03/08/2015    Priority: Medium    Essential hypertension 03/08/2015    Priority: Medium    Charcot's joint of foot, right 11/18/2019    Priority: Low   Status post split thickness skin graft 12/08/2016    Priority: Low   B12 deficiency 11/17/2016    Priority: Low   Iron deficiency anemia 11/17/2016    Priority: Low   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 ([place]) 11/17/2016    Priority: Low   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 ([place]) 11/17/2016    Priority: Low   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     aspirin EC 81 MG tablet Take 81 mg by mouth daily.      atorvastatin (LIPITOR) 80 MG tablet Take 1 tablet (80 mg total) by mouth daily. 90 tablet 3   Cyanocobalamin (VITAMIN B-12) 5000 MCG SUBL Place 5,000 mcg under the tongue daily.     fenofibrate (TRICOR) 48 MG tablet TAKE 1 TABLET BY MOUTH EVERY DAY 90 tablet 4   glucose blood (ACCU-CHEK GUIDE) test strip Dx E11.49 - Use to check blood glucose three times daily or as directed. 300 each 1   hydrochlorothiazide (MICROZIDE) 12.5 MG capsule TAKE 1 CAPSULE BY MOUTH EVERY DAY IN THE MORNING 100 capsule 3   insulin aspart (NOVOLOG) 100 UNIT/ML FlexPen Inject 15-25 Units into the skin 3 (three) times daily with meals. Please provide pen needles if possible 45 mL 3   insulin degludec (TRESIBA FLEXTOUCH) 200 UNIT/ML FlexTouch Pen Inject 80-90 Units into the skin daily. 15 mL 3   Insulin Syringe-Needle U-100 (BD INSULIN SYRINGE U/F) 31G X 5/16" 0.5 ML MISC Use to inject insulin 4 times daily. DX:E11.9 100 each 4   Insulin Syringe-Needle U-100 (BD INSULIN SYRINGE U/F) 31G X 5/16" 1 ML MISC Use to inject tresiba/long acting insulin once daily 100 each 3   Insulin Syringe-Needle U-100 (BD VEO INSULIN SYR U/F 1/2UNIT) 31G X 15/64" 0.3 ML MISC USE TO INJECT INSULIN FOUR TIMES DAILY AS DIRECTED 300 each 2   Lancets (ONETOUCH ULTRASOFT) lancets  Use to test blood sugars daily. Dx: E11.9 100 each 12   Lancets MISC USED TO CHECK BLOOD GLUCOSE 3-4 TIMES DAILY 200 each 3   Multiple Vitamin (MULTIVITAMIN WITH MINERALS) TABS tablet Take 1 tablet by mouth 2 (two) times daily.     Omega-3 Fatty Acids (FISH OIL) 1360 MG CAPS Take 1,360 mg by mouth daily.      pyridoxine (B-6) 100 MG tablet Take 100 mg by mouth 2 (two) times daily.     ramipril (ALTACE) 5 MG capsule TAKE 1 CAPSULE BY MOUTH EVERY DAY IN THE MORNING 90 capsule 4   sitaGLIPtin (JANUVIA) 50 MG tablet Take 1 tablet (50 mg total) by mouth daily. 100 tablet 3   No current facility-administered medications for this visit.     Objective:  BP 126/62   Pulse 74    Temp 98.2 F (36.8 C)   Ht '6\' 1"'$  (1.854 m)   Wt (!) 310 lb 12.8 oz (141 kg)   SpO2 96%   BMI 41.01 kg/m  Gen: NAD, resting comfortably CV: RRR no murmurs rubs or gallops Lungs: CTAB no crackles, wheeze, rhonchi Ext: no edema Skin: warm, dry    Assessment and Plan   #Diabetes-typically with A1c at least under 7.5 S: Medication: Tresiba at night up to 85-90 units, NovoLog 20-25 units twice daily -Take Januvia 50 mg in the morning.  - no recent low sugars- 15 lowest recently, 92 in mid June as well. Averaging 95-120 Lab Results  Component Value Date   HGBA1C 6.8 (H) 05/15/2021  A/P: diabetes well controlled- too soon for repeat a1c- will check at follow up -we also considered changing to ozempic or mounjaro from [place] and lowering insulin  -slightly more nervous with foot issues about jardiance/invokana type products  # Hyperlipidemia-Ideal LDL under 70-tolerate under 100 S:Medication: Atorvastatin 80 mg daily up from 60 mg along with fenofibrate 48 mg - also prefers to take aspirin for primary prevention  Lab Results  Component Value Date   CHOL 169 05/15/2021   HDL 41.80 05/15/2021   LDLCALC 111 (H) 05/15/2021   LDLDIRECT 98.0 10/31/2020   TRIG 79.0 05/15/2021   CHOLHDL 4 05/15/2021  A/P: mild poor control last visit but hopefully improving on higher dose- follow up with labs next visit   #Hypertension/CKD stage III  S: HCTZ 12.5 mg and Ramipril 5 mg  BP Readings from Last 3 Encounters:  08/01/21 126/62  05/15/21 130/72  04/08/21 (!) 156/63  A/P: Controlled. Continue current medications.   #B12 deficiency/anemia S Current treatment/medication(oral vs IM): Cyanocobalamin 5000 mcg daily. Also was takes ferrous sulfate supplements twice daily.  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 Working with wound care as well- has a channel causing issues but narrowing so hopeful- and has close follow up weekly. Also sees ID next week.   Recommended follow up: Return for next already scheduled visit or sooner if needed. Future Appointments  Date Time Provider [place]  08/05/2021  3:30 PM [name], MD [facility]  08/06/2021  3:30 PM Mignon Pine, DO [facility]  11/14/2021  3:20 PM Yong Channel Brayton Mars, MD [facility]   Lab/Order associations:   ICD-10-CM   1. Type II diabetes  mellitus with neurological manifestations ([place])  E11.49     2. Essential hypertension  I10     3. Hyperlipidemia associated with type 2 diabetes mellitus ([facility])  E11.69    E78.5      Meds ordered this encounter  Medications   insulin degludec (TRESIBA FLEXTOUCH) 200 UNIT/ML FlexTouch Pen    Sig: Inject 80-90 Units into the skin daily.    Dispense:  15 mL    Refill:  3   Return precautions advised.  [name], MD

## 2021-08-01 NOTE — Patient Instructions (Addendum)
No changes today- considering changing to ozempic or mounjaro from [place] on follow up- once you run lower on supply  Recommended follow up: Return for next already scheduled visit or sooner if needed.

## 2021-08-05 ENCOUNTER — Encounter (HOSPITAL_BASED_OUTPATIENT_CLINIC_OR_DEPARTMENT_OTHER): Payer: BLUE CROSS/BLUE SHIELD | Admitting: General Surgery

## 2021-08-05 DIAGNOSIS — E11621 Type 2 diabetes mellitus with foot ulcer: Secondary | ICD-10-CM | POA: Diagnosis not present

## 2021-08-06 ENCOUNTER — Other Ambulatory Visit: Payer: Self-pay

## 2021-08-06 ENCOUNTER — Encounter: Payer: Self-pay | Admitting: Internal Medicine

## 2021-08-06 ENCOUNTER — Ambulatory Visit (INDEPENDENT_AMBULATORY_CARE_PROVIDER_SITE_OTHER): Payer: BLUE CROSS/BLUE SHIELD | Admitting: Internal Medicine

## 2021-08-06 ENCOUNTER — Telehealth: Payer: Self-pay

## 2021-08-06 VITALS — BP 135/87 | HR 91 | Temp 98.0°F | Wt 316.6 lb

## 2021-08-06 DIAGNOSIS — M86171 Other acute osteomyelitis, right ankle and foot: Secondary | ICD-10-CM | POA: Diagnosis not present

## 2021-08-06 NOTE — Telephone Encounter (Signed)
New OPAT orders per Dr. Juleen China , orders shared with Carolynn Sayers, RN at [facility] and [facility] staff.   IR appointment:  08/09/2021 at 11 AM at [facility] - appointment time and location provided to both patient and [facility].   First dose: no need for short stay per provider.   [place], CMA

## 2021-08-06 NOTE — Telephone Encounter (Signed)
No problem - thanks Tiffany!

## 2021-08-06 NOTE — Progress Notes (Signed)
[facility]  Reason for Consult:  Osteomyelitis  Referring Provider: Dr Celine Ahr   HPI:    [name] is a 51 y.o. male with PMHx as below who presents to the clinic for right foot osteomyelitis.   Patient is here today and his wife is joining the visit via telephone.  He is a patient with diabetes and numerous diabetic foot infection in the past with resultant amputation of toes 3-5 on the right.  This most recent amputation was in August 2022 and he completed a prolonged course of antibiotics x 6 weeks at that time followed by Dr Gale Journey.  He has continued to follow up with podiatry and last saw Dr Gale Journey in September 2022.  He reports the incision/ulcer never fully closed following that surgery and antibiotic course.  He was referred to the wound center and Dr Celine Ahr on 07/04/21 after podiatry felt the wound had stalled and was not making progress.  He has been seeing Dr Celine Ahr regularly since that time and as recently as yesterday.  On 6/30, it was noted that the wound probed fairly deep. 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 No focal fluid collection to suggest an abscess was identified.  He was referred to our clinic for further recommendations on antibiotics.    His most recent A1c in April 2023 was 6.8.  He had ABI in April 2023 which were normal.  His cultures from August 2022 grew PsA (S cipro), bacteroides, mssa (R tetra), corynebacterium not striatum.  Patient's Medications  New Prescriptions   No medications on file  Previous Medications   ASCORBIC ACID (VITAMIN C) 500 MG TABLET    Take 500 mg by mouth daily.   ASPIRIN EC 81 MG TABLET    Take 81 mg by mouth daily.    ATORVASTATIN (LIPITOR) 80 MG TABLET    Take 1 tablet (80 mg total) by mouth daily.   CYANOCOBALAMIN (VITAMIN B-12) 5000 MCG SUBL    Place 5,000 mcg under the tongue daily.   FENOFIBRATE (TRICOR) 48 MG TABLET    TAKE 1 TABLET BY MOUTH EVERY DAY   GLUCOSE BLOOD (ACCU-CHEK GUIDE) TEST STRIP    Dx E11.49 - Use to check blood glucose three times daily or as directed.   HYDROCHLOROTHIAZIDE (MICROZIDE) 12.5 MG CAPSULE    TAKE 1 CAPSULE BY MOUTH EVERY DAY IN THE MORNING   INSULIN ASPART (NOVOLOG) 100 UNIT/ML FLEXPEN    Inject 15-25 Units into the skin 3 (three) times daily with meals. Please provide pen needles if possible   INSULIN DEGLUDEC (TRESIBA FLEXTOUCH) 200 UNIT/ML FLEXTOUCH PEN    Inject 80-90 Units into the skin daily.   INSULIN SYRINGE-NEEDLE U-100 (BD INSULIN SYRINGE U/F) 31G X 5/16" 0.5 ML MISC    Use to inject insulin 4 times daily. DX:E11.9   INSULIN SYRINGE-NEEDLE U-100 (BD INSULIN SYRINGE U/F) 31G X 5/16" 1 ML MISC    Use to inject tresiba/long acting insulin once daily   INSULIN SYRINGE-NEEDLE U-100 (BD VEO INSULIN SYR U/F 1/2UNIT) 31G X 15/64" 0.3 ML MISC    USE TO INJECT INSULIN FOUR TIMES DAILY AS DIRECTED   LANCETS (ONETOUCH ULTRASOFT) LANCETS    Use to test blood sugars daily. Dx: E11.9  LANCETS MISC    USED TO CHECK BLOOD GLUCOSE 3-4 TIMES DAILY   MULTIPLE VITAMIN (MULTIVITAMIN WITH MINERALS) TABS TABLET    Take 1 tablet by mouth 2 (two) times daily.   OMEGA-3 FATTY ACIDS (FISH OIL) 1360 MG CAPS    Take 1,360 mg by mouth daily.    PYRIDOXINE (B-6) 100 MG TABLET    Take 100 mg by mouth 2 (two) times daily.   RAMIPRIL (ALTACE) 5 MG CAPSULE    TAKE 1 CAPSULE BY MOUTH EVERY DAY IN THE MORNING   SITAGLIPTIN (JANUVIA) 50 MG TABLET    Take 1 tablet (50 mg total) by mouth daily.  Modified Medications   No medications on file  Discontinued Medications   No medications on file      Past Medical History:  Diagnosis Date   Cellulitis and abscess of foot    05/ 2021 left   CKD (chronic  kidney disease), stage III ([place]) 06-17-2019 per pt last visit 05-29-2016 (note in care everywhere)  currently followed by pcp   nephrologist-  dr [name] ([facility] in [place])     Diabetic peripheral neuropathy ([place])    Elevated LFTs    followed by pcp   Hypertension    followed by pcp---- (06-17-2019 pt stated never had a stress test)   Iron deficiency anemia    Mixed hyperlipidemia    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   Prolactinoma, 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   (06-17-2019 pt stated has been stable with no changes and followed by pcp)   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[place])    11-06-2016  lower extremity angiography (dr [name])  mil tibial disease w/ occlusion of the distal peroneal artery and dorsalis pedis artery but has widely patent posterior tibial and anterior tibial arterties   Type 2 diabetes mellitus treated with insulin ([place])    followed by pcp   (06-17-2019  pt stated checks blood sugar daily in am,  fasting sugar-- average 71)   Vitamin B 12 deficiency     Social History   Tobacco Use   Smoking status: Never   Smokeless tobacco: Never  Vaping Use   Vaping Use: Never used  Substance Use Topics   Alcohol use: No   Drug use: Never    Family History  Problem Relation Age of Onset   Sarcoidosis Mother    Stroke Father        early 43s. former smoker   Diabetes Father    Hypertension Father    Hypertension Sister    Diabetes Paternal Grandmother    Congenital heart disease Paternal Grandmother        died of complications   Healthy Daughter    Colon cancer Neg Hx    Colon polyps Neg Hx    Esophageal cancer Neg Hx    Rectal cancer Neg Hx    Stomach cancer Neg Hx     No Known Allergies  Review of Systems  Constitutional: Negative.   Respiratory: Negative.    Cardiovascular: Negative.   Gastrointestinal: Negative.   Musculoskeletal:  Positive for joint pain.  All other systems reviewed and are negative.     OBJECTIVE:    Vitals:    08/06/21 1512  BP: 135/87  Pulse: 91  Temp: 98 F (36.7 C)  TempSrc: Oral  SpO2: 99%  Weight: (!) 316 lb 9.6 oz (143.6 kg)     Body mass index is 41.77 kg/m.  Physical Exam Constitutional:  General: He is not in acute distress.    Appearance: Normal appearance.  HENT:     Head: Normocephalic and atraumatic.  Eyes:     Extraocular Movements: Extraocular movements intact.     Conjunctiva/sclera: Conjunctivae normal.  Pulmonary:     Effort: Pulmonary effort is normal. No respiratory distress.  Abdominal:     General: There is no distension.     Palpations: Abdomen is soft.  Musculoskeletal:     Comments: Right foot ulcer noted with prior amputations of toes on that foot.   Skin:    General: Skin is warm and dry.     Findings: No rash.  Neurological:     General: No focal deficit present.     Mental Status: He is alert and oriented to person, place, and time.  Psychiatric:        Mood and Affect: Mood normal.        Behavior: Behavior normal.      Labs and Microbiology:     Latest Ref Rng & Units 05/15/2021    9:22 AM 10/16/2020   11:33 AM 09/05/2020    3:31 AM  CBC  WBC 4.0 - 10.5 K/uL 9.4  7.6  9.6   Hemoglobin 13.0 - 17.0 g/dL 12.1  13.6  12.0   Hematocrit 39.0 - 52.0 % 36.7  42.5  38.1   Platelets 150.0 - 400.0 K/uL 377.0  296  281       Latest Ref Rng & Units 05/15/2021    9:22 AM 11/22/2020    8:37 AM 10/31/2020    9:07 AM  CMP  Glucose 70 - 99 mg/dL 69  77  103   BUN 6 - 23 mg/dL 22  21  20    Creatinine 0.40 - 1.50 mg/dL 1.37  1.53  1.56   Sodium 135 - 145 mEq/L 140  139  138   Potassium 3.5 - 5.1 mEq/L 4.5  3.9  4.3   Chloride 96 - 112 mEq/L 103  104  100   CO2 19 - 32 mEq/L 30  29  30    Calcium 8.4 - 10.5 mg/dL 9.8  9.5  10.1   Total Protein 6.0 - 8.3 g/dL 8.3  7.8  7.8   Total Bilirubin 0.2 - 1.2 mg/dL 0.7  0.9  0.9   Alkaline Phos 39 - 117 U/L 84  64  79   AST 0 - 37 U/L 18  23  44   ALT 0 - 53 U/L 24  30  84        ASSESSMENT & PLAN:     Osteomyelitis of ankle or foot, acute, right (HCC) Patient is here today with a chronic diabetic foot ulcer complicated by worsened osteomyelitis and recent MRI on 07/18/21 showing fairly extensive involvement of his foot.  We discussed treatment options today including definitive amputation to cure this infection vs antibiotics/wound care.  He wants to exhaust all efforts at limb salvage at this time which is understandable.  We did discuss that with deep seated infections sometimes antibiotic therapy is not able to eradicate infection and amputation may be needed in the future but he would like to proceed with medical management while continuing to follow up closely with wound care.  We will arrange for a PICC line to be placed this Friday 08/09/21 and start cefepime and daptomycin at that time to cover for PsA and MRSA.  I will see him again in 4 weeks and we will check  baseline labs today.   Diagnosis: Right foot osteomyelitis  Culture Result: N/A  No Known Allergies  OPAT Orders Discharge antibiotics to be given via PICC line Discharge antibiotics: Per pharmacy protocol  Cefepime 2gm q8h Daptomycin 961m daily  Duration: 6 weeks  End Date: 09/20/21  [facility]  Home health RN for IV administration and teaching; PICC line care and labs.    Labs weekly while on IV antibiotics: _xxx_ CBC with differential __ BMP _xxx_ CMP _xxx_ CRP _xxx_ ESR __ Vancomycin trough _xxx_ CK  _xxx_ Please pull PIC at completion of IV antibiotics __ Please leave PIC in place until doctor has seen patient or been notified  Fax weekly labs to ([phone]  Orders Placed This Encounter  Procedures   IR PICC PLACEMENT LEFT >5 YRS INC IMG GUIDE    Standing Status:   Future    Standing Expiration Date:   08/07/2022    Order Specific Question:   Reason for Exam (SYMPTOM  OR DIAGNOSIS REQUIRED)    Answer:   IV antibiotics    Order Specific Question:   Preferred Imaging [facility]?     [facility]  COMPLETE METABOLIC PANEL WITH GFR   CBC   Sedimentation rate   C-reactive protein   CK (Creatine Kinase)        [facility] 08/06/2021, 4:04 PM  I have personally spent 40 minutes involved in face-to-face and non-face-to-face activities for this patient on the day of the visit. Professional time spent includes the following activities: Preparing to see the patient (review of tests), Obtaining and/or reviewing separately obtained history (admission/discharge record), Performing a medically appropriate examination and/or evaluation , Ordering medications/tests/procedures, referring and communicating with other health care professionals, Documenting clinical information in the EMR, Independently interpreting results (not separately reported), Communicating results to the patient/family/caregiver, Counseling and educating the patient/family/caregiver and Care coordination (not separately reported).

## 2021-08-06 NOTE — Progress Notes (Signed)
[name], [name] ([id number]) Visit Report for 08/05/2021 Arrival Information Details Patient Name: Date of Service: [name],  [name] 08/05/2021 3:30 PM Medical Record Number: [id number] Patient Account Number: [id number] Date of Birth/Sex: Treating RN: [birth year] [name] Primary Care [name]: [name] Other Clinician: Referring Gilmar Bua: Treating Kindall Swaby/Extender: [name] in Treatment: 4 Visit Information History Since Last Visit Added or deleted any medications: No Patient Arrived: Ambulatory Any new allergies or adverse reactions: No Arrival Time: 16:08 Had a fall or experienced change in No Transfer Assistance: None activities of daily living that may affect Patient Requires Transmission-Based Precautions: No risk of falls: Patient Has Alerts: Yes Signs or symptoms of abuse/neglect since last visito No Patient Alerts: ABI R 1.17 Hospitalized since last visit: No ABI L 1.29 Implantable device outside of the clinic excluding No R TBI 1.03 cellular tissue based products placed in the center L TBI 0.77 since last visit: Pain Present Now: No Electronic Signature(s) Signed: 08/06/2021 5:31:58 PM By: [name] RN, BSN Entered By: [name] on 08/05/2021 16:12:47 -------------------------------------------------------------------------------- Encounter Discharge Information Details Patient Name: Date of Service: [name], [name] 08/05/2021 3:30 PM Medical Record Number: [id number] Patient Account Number: [id number] Date of Birth/Sex: Treating RN: 1970-10-12 [name] Primary Care [name]: [name] Other Clinician: Referring Massie Mees: Treating Myriah Boggus/Extender: [name] in Treatment: 4 Encounter Discharge Information Items Post Procedure Vitals Discharge Condition: Stable Temperature (F): 97.9 Ambulatory Status: Ambulatory Pulse (bpm):  87 Discharge Destination: Home Respiratory Rate (breaths/min): 18 Transportation: Private Auto Blood Pressure (mmHg): 157/89 Accompanied By: self Schedule Follow-up Appointment: Yes Clinical Summary of Care: Patient Declined Electronic Signature(s) Signed: 08/06/2021 5:31:58 PM By: [name] RN, BSN Entered By: [name] on 08/06/2021 08:58:51 -------------------------------------------------------------------------------- Lower Extremity Assessment Details Patient Name: Date of Service: [name], [name] 08/05/2021 3:30 PM Medical Record Number: [id number] Patient Account Number: [id number] Date of Birth/Sex: Treating RN: [birth year] [name] Primary Care Wardell Pokorski: [name] Other Clinician: Referring Vici Novick: Treating Sincere Berlanga/Extender: [name] in Treatment: 4 Edema Assessment Assessed: [name]: No] [Right: No] Edema: [Left: N] [Right: o] Calf Left: Right: Point of Measurement: From Medial Instep 42.5 cm Ankle Left: Right: Point of Measurement: From Medial Instep 25 cm Vascular Assessment Pulses: Dorsalis Pedis Palpable: [Right:No] Electronic Signature(s) Signed: 08/06/2021 5:31:58 PM By: [name] RN, BSN Entered By: [name] on 08/05/2021 16:26:13 -------------------------------------------------------------------------------- Multi Wound Chart Details Patient Name: Date of Service: [name], [name] 08/05/2021 3:30 PM Medical Record Number: [id number] Patient Account Number: [id number] Date of Birth/Sex: Treating RN: [birth year] [name] Primary Care Kaydance Bowie: [name] Other Clinician: Referring Raylyn Carton: Treating Aviah Sorci/Extender: [name] in Treatment: 4 Vital Signs Height(in): 72 Capillary Blood Glucose(mg/dl): 104 Weight(lbs): 312 Pulse(bpm): 87 Body Mass Index(BMI): 42.3 Blood Pressure(mmHg): 157/89 Temperature(F):  97.9 Respiratory Rate(breaths/min): 18 Photos: [2:Right, Lateral, Plantar Foot] [N/A:N/A N/A] Wound Location: [2:Gradually Appeared] [N/A:N/A] Wounding Event: [2:Diabetic Wound/Ulcer of the Lower] [N/A:N/A] Primary Etiology: [2:Extremity Type II Diabetes] [N/A:N/A] Comorbid History: [2:12/19/2016] [N/A:N/A] Date Acquired: [2:4] [N/A:N/A] Weeks of Treatment: [2:Open] [N/A:N/A] Wound Status: [2:No] [N/A:N/A] Wound Recurrence: [2:0.3x2x4.1] [N/A:N/A] Measurements L x W x D (cm) [2:0.471] [N/A:N/A] A (cm) : rea [2:1.932] [N/A:N/A] Volume (cm) : [2:87.00%] [N/A:N/A] % Reduction in A [2:rea: 89.30%] [N/A:N/A] % Reduction in Volume: [2:Grade 3] [N/A:N/A] Classification: [2:Medium] [N/A:N/A] Exudate A mount: [2:Serosanguineous] [N/A:N/A] Exudate Type: [2:red, brown] [N/A:N/A] Exudate Color: [  2:Thickened] [N/A:N/A] Wound Margin: [2:Large (67-100%)] [N/A:N/A] Granulation A mount: [2:Red, Friable] [N/A:N/A] Granulation Quality: [2:Small (1-33%)] [N/A:N/A] Necrotic A mount: [2:Fat Layer (Subcutaneous Tissue): Yes N/A] Exposed Structures: [2:Fascia: No Tendon: No Muscle: No Joint: No Bone: No Small (1-33%)] [N/A:N/A] Epithelialization: [2:Debridement - Selective/Open Wound N/A] Debridement: Pre-procedure Verification/Time Out 16:35 [N/A:N/A] Taken: [2:Lidocaine 4% Topical Solution] [N/A:N/A] Pain Control: [2:Callus] [N/A:N/A] Tissue Debrided: [2:Skin/Epidermis] [N/A:N/A] Level: [2:6] [N/A:N/A] Debridement A (sq cm): [2:rea Curette] [N/A:N/A] Instrument: [2:Minimum] [N/A:N/A] Bleeding: [2:Pressure] [N/A:N/A] Hemostasis A chieved: [2:0] [N/A:N/A] Procedural Pain: [2:0] [N/A:N/A] Post Procedural Pain: [2:Procedure was tolerated well] [N/A:N/A] Debridement Treatment Response: [2:0.3x2x4.1] [N/A:N/A] Post Debridement Measurements L x W x D (cm) [2:1.932] [N/A:N/A] Post Debridement Volume: (cm) [2:Debridement] [N/A:N/A] Treatment Notes Electronic Signature(s) Signed: 08/05/2021 4:50:25  PM By: [name] MD FACS Signed: 08/06/2021 5:31:58 PM By: [name] RN, BSN Entered By: [name] on 08/05/2021 16:50:24 -------------------------------------------------------------------------------- Multi-Disciplinary Care Plan Details Patient Name: Date of Service: [name], [name] 08/05/2021 3:30 PM Medical Record Number: [id number] Patient Account Number: [id number] Date of Birth/Sex: Treating RN: 04-Apr-1970 [name] Primary Care Oyindamola Key: [name] Other Clinician: Referring Patrese Neal: Treating Angelia Hazell/Extender: [name] in Treatment: 4 Multidisciplinary Care Plan reviewed with physician Active Inactive Nutrition Nursing Diagnoses: Impaired glucose control: actual or potential Potential for alteratiion in Nutrition/Potential for imbalanced nutrition Goals: Patient/caregiver will maintain therapeutic glucose control Date Initiated: 07/29/2021 Target Resolution Date: 08/26/2021 Goal Status: Active Interventions: Assess patient nutrition upon admission and as needed per policy Provide education on elevated blood sugars and impact on wound healing Treatment Activities: Patient referred to Primary Care Physician for further nutritional evaluation : 07/29/2021 Notes: Osteomyelitis Nursing Diagnoses: Infection: osteomyelitis Knowledge deficit related to disease process and management Goals: Patient/caregiver will verbalize understanding of disease process and disease management Date Initiated: 07/29/2021 Target Resolution Date: 08/26/2021 Goal Status: Active Patient's osteomyelitis will resolve Date Initiated: 07/29/2021 Target Resolution Date: 08/26/2021 Goal Status: Active Interventions: Assess for signs and symptoms of osteomyelitis resolution every visit Provide education on osteomyelitis Treatment Activities: Consult for HBO : 07/29/2021 Notes: Wound/Skin Impairment Nursing Diagnoses: Impaired  tissue integrity Goals: Patient/caregiver will verbalize understanding of skin care regimen Date Initiated: 07/04/2021 Target Resolution Date: 08/16/2021 Goal Status: Active Interventions: Assess ulceration(s) every visit Treatment Activities: Skin care regimen initiated : 07/04/2021 Notes: Electronic Signature(s) Signed: 08/06/2021 5:31:58 PM By: [name] RN, BSN Entered By: [name] on 08/05/2021 16:27:00 -------------------------------------------------------------------------------- Pain Assessment Details Patient Name: Date of Service: [place], [name] 08/05/2021 3:30 PM Medical Record Number: [id number] Patient Account Number: [id number] Date of Birth/Sex: Treating RN: 11-19-1970 [name] Primary Care Britany Callicott: [name] Other Clinician: Referring Aasiyah Auerbach: Treating Colin Ellers/Extender: [name] in Treatment: 4 Active Problems Location of Pain Severity and Description of Pain Patient Has Paino No Site Locations With Dressing Change: No Rate the pain. Current Pain Level: 0 Character of Pain Describe the Pain: Tender Pain Management and Medication Current Pain Management: Medication: No Electronic Signature(s) Signed: 08/06/2021 5:31:58 PM By: [name] RN, BSN Entered By: [name] on 08/05/2021 16:15:23 -------------------------------------------------------------------------------- Patient/Caregiver Education Details Patient Name: Date of Service: Horald Chest[place], [name] 7/17/2023andnbsp3:30 PM Medical Record Number: [id number] Patient Account Number: [id number] Date of Birth/Gender: Treating RN: February 18, 1970 (50 y.o. [name] Primary Care Physician: [name] Other Clinician: Referring Physician: Treating Physician/Extender: [name] in Treatment: 4 Education Assessment Education Provided To: Patient Education Topics Provided Elevated  Blood  Sugar/ Impact on Healing: Methods: Explain/Verbal Responses: Reinforcements needed, State content correctly Offloading: Methods: Explain/Verbal Responses: Reinforcements needed, State content correctly Wound/Skin Impairment: Methods: Explain/Verbal Responses: Reinforcements needed, State content correctly Electronic Signature(s) Signed: 08/06/2021 5:31:58 PM By: [name] RN, BSN Entered By: [name] on 08/05/2021 16:28:07 -------------------------------------------------------------------------------- Wound Assessment Details Patient Name: Date of Service: [name], [name] 08/05/2021 3:30 PM Medical Record Number: [id number] Patient Account Number: [id number] Date of Birth/Sex: Treating RN: [birth year] [name] Primary Care Alizee Maple: [name] Other Clinician: Referring Carsen Machi: Treating Johany Hansman/Extender: [name] in Treatment: 4 Wound Status Wound Number: 2 Primary Etiology: Diabetic Wound/Ulcer of the Lower Extremity Wound Location: Right, Lateral, Plantar Foot Wound Status: Open Wounding Event: Gradually Appeared Comorbid History: Type II Diabetes Date Acquired: 12/19/2016 Weeks Of Treatment: 4 Clustered Wound: No Photos Wound Measurements Length: (cm) 0.3 Width: (cm) 2 Depth: (cm) 4.1 Area: (cm) 0.471 Volume: (cm) 1.932 % Reduction in Area: 87% % Reduction in Volume: 89.3% Epithelialization: Small (1-33%) Wound Description Classification: Grade 3 Wound Margin: Thickened Exudate Amount: Medium Exudate Type: Serosanguineous Exudate Color: red, brown Foul Odor After Cleansing: No Slough/Fibrino Yes Wound Bed Granulation Amount: Large (67-100%) Exposed Structure Granulation Quality: Red, Friable Fascia Exposed: No Necrotic Amount: Small (1-33%) Fat Layer (Subcutaneous Tissue) Exposed: Yes Tendon Exposed: No Muscle Exposed: No Joint Exposed: No Bone Exposed: No Treatment  Notes Wound #2 (Foot) Wound Laterality: Plantar, Right, Lateral Cleanser Soap and Water Discharge Instruction: May shower and wash wound with dial antibacterial soap and water prior to dressing change. Wound Cleanser Discharge Instruction: Cleanse the wound with wound cleanser prior to applying a clean dressing using gauze sponges, not tissue or cotton balls. Peri-Wound Care cotton tipped applicators Topical Gentamicin Discharge Instruction: As directed by physician Primary Dressing KerraCel Ag Gelling Fiber Dressing, 4x5 in (silver alginate) Discharge Instruction: Apply silver alginate to wound bed as instructed Secondary Dressing Secured With Paper Tape, 2x10 (in/yd) Discharge Instruction: Secure dressing with tape as directed. Compression Wrap Kerlix Roll 4.5x3.1 (in/yd) Discharge Instruction: Apply Kerlix and Coban compression as directed. Compression Stockings Add-Ons Electronic Signature(s) Signed: 08/06/2021 5:31:58 PM By: [name] RN, BSN Entered By: [name] on 08/05/2021 16:24:52 -------------------------------------------------------------------------------- Vitals Details Patient Name: Date of Service: [name], [name] 08/05/2021 3:30 PM Medical Record Number: [id number] Patient Account Number: [id number] Date of Birth/Sex: Treating RN: September 02, 1970 [name] Primary Care Milli Woolridge: [name] Other Clinician: Referring [name]: Treating Chayce Rullo/Extender: [name] in Treatment: 4 Vital Signs Time Taken: 16:12 Temperature (F): 97.9 Height (in): 72 Pulse (bpm): 87 Weight (lbs): 312 Respiratory Rate (breaths/min): 18 Body Mass Index (BMI): 42.3 Blood Pressure (mmHg): 157/89 Capillary Blood Glucose (mg/dl): 104 Reference Range: 80 - 120 mg / dl Electronic Signature(s) Signed: 08/06/2021 5:31:58 PM By: [name] RN, BSN Entered By: [name] on 08/05/2021 16:14:18

## 2021-08-06 NOTE — Assessment & Plan Note (Signed)
Patient is here today with a chronic diabetic foot ulcer complicated by worsened osteomyelitis and recent MRI on 07/18/21 showing fairly extensive involvement of his foot.  We discussed treatment options today including definitive amputation to cure this infection vs antibiotics/wound care.  He wants to exhaust all efforts at limb salvage at this time which is understandable.  We did discuss that with deep seated infections sometimes antibiotic therapy is not able to eradicate infection and amputation may be needed in the future but he would like to proceed with medical management while continuing to follow up closely with wound care.  We will arrange for a PICC line to be placed this Friday 08/09/21 and start cefepime and daptomycin at that time to cover for PsA and MRSA.  I will see him again in 4 weeks and we will check baseline labs today.   Diagnosis: Right foot osteomyelitis  Culture Result: N/A  No Known Allergies  OPAT Orders Discharge antibiotics to be given via PICC line Discharge antibiotics: Per pharmacy protocol  Cefepime 2gm q8h Daptomycin 938m daily  Duration: 6 weeks  End Date: 09/20/21  [facility]  Home health RN for IV administration and teaching; PICC line care and labs.    Labs weekly while on IV antibiotics: _xxx_ CBC with differential __ BMP _xxx_ CMP _xxx_ CRP _xxx_ ESR __ Vancomycin trough _xxx_ CK  _xxx_ Please pull PIC at completion of IV antibiotics __ Please leave PIC in place until doctor has seen patient or been notified  Fax weekly labs to ([phone]

## 2021-08-07 ENCOUNTER — Other Ambulatory Visit (HOSPITAL_COMMUNITY): Payer: Self-pay | Admitting: General Surgery

## 2021-08-07 DIAGNOSIS — Z9289 Personal history of other medical treatment: Secondary | ICD-10-CM

## 2021-08-07 LAB — COMPLETE METABOLIC PANEL WITH GFR
AG Ratio: 0.8 (calc) — ABNORMAL LOW (ref 1.0–2.5)
ALT: 42 U/L (ref 9–46)
AST: 24 U/L (ref 10–35)
Albumin: 3.6 g/dL (ref 3.6–5.1)
Alkaline phosphatase (APISO): 115 U/L (ref 35–144)
BUN/Creatinine Ratio: 17 (calc) (ref 6–22)
BUN: 22 mg/dL (ref 7–25)
CO2: 26 mmol/L (ref 20–32)
Calcium: 9.6 mg/dL (ref 8.6–10.3)
Chloride: 101 mmol/L (ref 98–110)
Creat: 1.31 mg/dL — ABNORMAL HIGH (ref 0.70–1.30)
Globulin: 4.6 g/dL (calc) — ABNORMAL HIGH (ref 1.9–3.7)
Glucose, Bld: 211 mg/dL — ABNORMAL HIGH (ref 65–99)
Potassium: 4.1 mmol/L (ref 3.5–5.3)
Sodium: 136 mmol/L (ref 135–146)
Total Bilirubin: 0.5 mg/dL (ref 0.2–1.2)
Total Protein: 8.2 g/dL — ABNORMAL HIGH (ref 6.1–8.1)
eGFR: 66 mL/min/{1.73_m2} (ref >=60)

## 2021-08-07 LAB — CBC
HCT: 38.5 % (ref 38.5–50.0)
Hemoglobin: 12.1 g/dL — ABNORMAL LOW (ref 13.2–17.1)
MCH: 25.2 pg — ABNORMAL LOW (ref 27.0–33.0)
MCHC: 31.4 g/dL — ABNORMAL LOW (ref 32.0–36.0)
MCV: 80.2 fL (ref 80.0–100.0)
MPV: 8.8 fL (ref 7.5–12.5)
Platelets: 417 10*3/uL — ABNORMAL HIGH (ref 140–400)
RBC: 4.8 10*6/uL (ref 4.20–5.80)
RDW: 12.4 % (ref 11.0–15.0)
WBC: 9.4 10*3/uL (ref 3.8–10.8)

## 2021-08-07 LAB — CK: Total CK: 277 U/L — ABNORMAL HIGH (ref 44–196)

## 2021-08-07 LAB — C-REACTIVE PROTEIN: CRP: 52.5 mg/L — ABNORMAL HIGH (ref <8.0)

## 2021-08-07 LAB — SEDIMENTATION RATE: Sed Rate: 58 mm/h — ABNORMAL HIGH (ref 0–20)

## 2021-08-08 ENCOUNTER — Ambulatory Visit (HOSPITAL_COMMUNITY): Payer: BLUE CROSS/BLUE SHIELD

## 2021-08-08 NOTE — Progress Notes (Signed)
[name], Yahya M. ([id number]) Visit Report for 08/05/2021 Chief Complaint Document Details Patient Name: Date of Service: [place], [place] [name] 08/05/2021 3:30 PM Medical Record Number: [id number] Patient Account Number: [id number] Date of Birth/Sex: Treating RN: May 13, 1970 (51 y.o. [name] Primary Care Provider: [name] Other Clinician: Referring Provider: Treating Provider/Extender: [name] in Treatment: 4 Information Obtained from: Patient Chief Complaint Patients presents for treatment of an open diabetic ulcer Electronic Signature(s) Signed: 08/05/2021 4:50:31 PM By: [name] MD FACS Entered By: [name] on 08/05/2021 16:50:30 -------------------------------------------------------------------------------- Debridement Details Patient Name: Date of Service: [name], [name] 08/05/2021 3:30 PM Medical Record Number: [id number] Patient Account Number: [id number] Date of Birth/Sex: Treating RN: 1970/09/25 (51 y.o. [name] Primary Care Provider: [name] Other Clinician: Referring Provider: Treating Provider/Extender: [name] in Treatment: 4 Debridement Performed for Assessment: Wound #2 Right,Lateral,Plantar Foot Performed By: Physician [name], MD Debridement Type: Debridement Severity of Tissue Pre Debridement: Necrosis of bone Level of Consciousness (Pre-procedure): Awake and Alert Pre-procedure Verification/Time Out Yes - 16:35 Taken: Start Time: 16:35 Pain Control: Lidocaine 4% Topical Solution T Area Debrided (L x W): otal 2 (cm) x 3 (cm) = 6 (cm) Tissue and other material debrided: Non-Viable, Callus, Skin: Epidermis Level: Skin/Epidermis Debridement Description: Selective/Open Wound Instrument: Curette Bleeding: Minimum Hemostasis Achieved: Pressure Procedural Pain: 0 Post Procedural Pain: 0 Response to Treatment: Procedure was tolerated  well Level of Consciousness (Post- Awake and Alert procedure): Post Debridement Measurements of Total Wound Length: (cm) 0.3 Width: (cm) 2 Depth: (cm) 4.1 Volume: (cm) 1.932 Character of Wound/Ulcer Post Debridement: Requires Further Debridement Severity of Tissue Post Debridement: Necrosis of bone Post Procedure Diagnosis Same as Pre-procedure Electronic Signature(s) Signed: 08/05/2021 4:56:22 PM By: [name] MD FACS Signed: 08/06/2021 5:31:58 PM By: [name] RN, BSN Entered By: [name] on 08/05/2021 16:39:30 -------------------------------------------------------------------------------- HPI Details Patient Name: Date of Service: [name], [name] 08/05/2021 3:30 PM Medical Record Number: [id number] Patient Account Number: [id number] Date of Birth/Sex: Treating RN: 1970-05-30 (51 y.o. 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 The most recent amputation was in August 2022. At that operation, antibiotic beads were placed in the wound. He has been managed by podiatry for his procedures and management of his wounds. He has been in a Water engineer. He is on oral doxycycline. They have been using Betadine wet to dry dressings along with Iodosorb. The patient states that when he thinks the wound is getting too dry, he applies topical Neosporin. At his last visit, on June 7 of this year, the podiatrist determined that he felt the wound was stalled and referred him to wound care for additional evaluation and management. An MRI has been ordered, but not yet scheduled or performed. Pathology  from his operation in August 2022 demonstrated findings consistent with chronic osteomyelitis. Today, there is a large irregular wound on the plantar surface of his right foot, at about the level of the fifth metatarsal head. This tracks through to a pinpoint opening on the dorsal lateral portion of his foot. The intake nurse reported purulent drainage. There is some malodor from the wound.  No frank necrosis identified. 07/11/2021: Today, the wounds do not connect. I attempted multiple times from various directions and the shared tunnel is no longer open. He has some slough accumulation on the dorsal part of his foot as well as slough and callus buildup on the plantar surface. His MRI is scheduled for June 29. No purulent drainage or malodor appreciated today. 07/19/2021: The lateral foot wound has closed and there is no tunnel connecting the plantar foot wound to that site. The plantar foot wound still probes quite deeply, however. There is some slough, eschar, and nonviable tissue accumulated in the wound bed. No malodorous drainage present. His MRI was performed yesterday and is consistent with fairly extensive osteomyelitis. 07/29/2021: The patient has an appointment with infectious disease on July 18 to treat his osteomyelitis. The plantar wound still probes quite deeply, approaching bone. The orifice has narrowed quite substantially, however, making it more difficult for him to pack. 08/05/2021: The tunnel connecting the lateral foot wound to the plantar foot wound has reopened. He sees infectious disease tomorrow to discuss long-term antibiotic treatment for osteomyelitis. There was a bit of murky drainage in the wound, but this was noted after he had had topical lidocaine applied so may have just been a blob of the anesthetic. No odor or frank purulent drainage. The wound probes deeply at the midfoot approaching bone. He does have MRI results confirming his diagnosis of osteomyelitis. He has  failed to progress with conventional treatment and I think his best chance for preservation of the foot is to initiate hyperbaric oxygen therapy. Electronic Signature(s) Signed: 08/05/2021 4:52:13 PM By: [name] MD FACS Entered By: [name] on 08/05/2021 16:52:13 -------------------------------------------------------------------------------- Physical Exam Details Patient Name: Date of Service: [name], [name] 08/05/2021 3:30 PM Medical Record Number: [id number] Patient Account Number: [id number] Date of Birth/Sex: Treating RN: 1970-04-08 (51 y.o. [name] Primary Care Provider: [name] Other Clinician: Referring Provider: Treating Provider/Extender: [name] in Treatment: 4 Constitutional Hypertensive, asymptomatic. . . . No acute distress.Marland Kitchen Respiratory Normal work of breathing on room air.. Notes 08/05/2021: The tunnel connecting the lateral foot wound to the plantar foot wound has reopened. There was a bit of murky drainage in the wound, but this was noted after he had had topical lidocaine applied so may have just been a blob of the anesthetic. No odor or frank purulent drainage. The wound probes deeply at the midfoot approaching bone. Electronic Signature(s) Signed: 08/05/2021 4:53:02 PM By: [name] MD FACS Entered By: [name] on 08/05/2021 16:53:02 -------------------------------------------------------------------------------- Physician Orders Details Patient Name: Date of Service: [name], [name] 08/05/2021 3:30 PM Medical Record Number: [id number] Patient Account Number: [id number] Date of Birth/Sex: Treating RN: Sep 16, 1970 (51 y.o. 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 - Dr Celine Ahr Room 1 [facility] 08/05/21 3:30pm Return A Bathing/ Shower/ Hygiene Other Bathing/Shower/Hygiene Orders/Instructions: - Change dressing after bathing Off-Loading Other: - Try to not stand on your feet too much Hyperbaric Oxygen Therapy Evaluate for HBO Therapy Indication: - wagner grade 3 diabetic foot ulcer right foot 2.0 ATA for 90 Minutes  without A Breaks ir Total Number of Treatments: - 40 One treatments per day (delivered Monday through Friday unless otherwise specified in Special Instructions below): Finger stick Blood Glucose Pre- and Post- HBOT Treatment. Follow Hyperbaric Oxygen Glycemia Protocol Afrin (Oxymetazoline HCL) 0.05% nasal spray - 1 spray in both nostrils daily as needed prior to HBO treatment for difficulty clearing ears Wound Treatment Wound #2 - Foot Wound Laterality: Plantar, Right, Lateral Cleanser: Soap and Water Every Other Day/15 Days Discharge Instructions: May shower and wash wound with dial antibacterial soap and water prior to dressing change. Cleanser: Wound Cleanser (Generic) Every Other Day/15 Days Discharge Instructions: Cleanse the wound with wound cleanser prior to applying a clean dressing using gauze sponges, not tissue or cotton balls. Peri-Wound Care: cotton tipped applicators (Generic) Every Other Day/15 Days Topical: Gentamicin Every Other Day/15 Days Discharge Instructions: As directed by physician Prim Dressing: KerraCel Ag Gelling Fiber Dressing, 4x5 in (silver alginate) (Generic) Every Other Day/15 Days ary Discharge Instructions: Apply silver alginate to wound bed as instructed Secured With: Paper Tape, 2x10 (in/yd) (Generic) Every Other Day/15  Days Discharge Instructions: Secure dressing with tape as directed. Compression Wrap: Kerlix Roll 4.5x3.1 (in/yd) (Generic) Every Other Day/15 Days Discharge Instructions: Apply Kerlix and Coban compression as directed. Radiology X-ray, Chest 2 view - pre hyperbaric oxygen therapy CPT - (ICD10 E11.49 - Type 2 diabetes mellitus with other diabetic neurological complication) [employer] ECG - pre hyperbaric oxygen therapy - (ICD10 E11.49 - Type 2 diabetes mellitus with other diabetic neurological complication) GLYCEMIA INTERVENTIONS PROTOCOL PRE-HBO GLYCEMIA INTERVENTIONS ACTION INTERVENTION Obtain pre-HBO capillary blood glucose (ensure 1 physician order is in chart). A. Notify HBO physician and await physician orders. 2 If result is 70 mg/dl or below: B. If the result meets the hospital definition of a critical result, follow hospital policy. A. Give patient an 8 ounce Glucerna Shake, an 8 ounce Ensure, or 8 ounces of a Glucerna/Ensure equivalent dietary supplement*. B. Wait 30 minutes. If result is 71 mg/dl to 130 mg/dl: C. Retest patients capillary blood glucose (CBG). D. If result greater than or equal to 110 mg/dl, proceed with HBO. If result less than 110 mg/dl, notify HBO physician and consider holding HBO. If result is 131 mg/dl to 249 mg/dl: A. Proceed with HBO. A. Notify HBO physician and await physician orders. B. It is recommended to hold HBO and do If result is 250 mg/dl or greater: blood/urine ketone testing. C. If the result meets the hospital definition of a critical result, follow hospital policy. POST-HBO GLYCEMIA INTERVENTIONS ACTION INTERVENTION Obtain post HBO capillary blood glucose (ensure 1 physician order is in chart). A. Notify HBO physician and await physician orders. 2 If result is 70 mg/dl or below: B. If the result meets the hospital definition of a critical result, follow hospital policy. A. Give patient an 8 ounce Glucerna Shake,  an 8 ounce Ensure, or 8 ounces of a Glucerna/Ensure equivalent dietary supplement*. B. Wait 15 minutes for symptoms of If result is 71 mg/dl to 100 mg/dl: hypoglycemia (i.e. nervousness, anxiety, sweating, chills, clamminess, irritability, confusion, tachycardia or dizziness). C. If patient asymptomatic, discharge patient. If patient symptomatic, repeat capillary blood glucose (CBG) and notify HBO physician. If result is 101 mg/dl to 249 mg/dl: A. Discharge patient. A. Notify HBO physician and await physician orders. B. It is recommended to do blood/urine ketone If result is 250 mg/dl or greater: testing. C. If the result meets the hospital definition of a critical result, follow hospital policy. *Juice  or candies are NOT equivalent products. If patient refuses the Glucerna or Ensure, please consult the hospital dietitian for an appropriate substitute. Electronic Signature(s) Signed: 08/06/2021 5:31:58 PM By: [name] RN, BSN Signed: 08/07/2021 7:33:35 AM By: [name] MD FACS Previous Signature: 08/05/2021 4:56:22 PM Version By: [name] MD FACS Entered By: [name] on 08/06/2021 17:21:37 Prescription 08/05/2021 -------------------------------------------------------------------------------- [name], [place] M. [name] MD Patient Name: Provider: [birth year] [id number] Date of Birth: NPI#: Jerilynn Mages [id number] Sex: DEA #: [id number] [phone] Phone #: License #: [place] Patient Address: [address] [place], [place] [address] [place], [place] [address] [phone] Allergies No Known Allergies Provider's Orders X-ray, Chest 2 view - ICD10: E11.49 - pre hyperbaric oxygen therapy CPT Hand Signature: Date(s): Prescription 08/05/2021 [name], Raymond M. [name] MD Patient Name: Provider: 01/07/1971 [id number] Date of Birth: NPI#: Jerilynn Mages [id number] Sex: DEA  #: [id number] [phone] Phone #: License #: [place] Patient Address: [address] [place], [place] [address] [place], [place] [address] [phone] Allergies No Known Allergies Provider's Orders ECG - ICD10: E11.49 - pre hyperbaric oxygen therapy Hand Signature: Date(s): Electronic Signature(s) Signed: 08/06/2021 5:31:58 PM By: [name] RN, BSN Signed: 08/07/2021 7:33:35 AM By: [name] MD FACS Previous Signature: 08/05/2021 4:56:04 PM Version By: [name] MD FACS Entered By: [name] on 08/06/2021 17:21:37 -------------------------------------------------------------------------------- Problem List Details Patient Name: Date of Service: [name], [name] 08/05/2021 3:30 PM Medical Record Number: [id number] Patient Account Number: [id number] Date of Birth/Sex: Treating RN: [birth year] (51 y.o. 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 08/05/2021 3:30 PM Medical Record Number: [id number] Patient Account Number: [id number] Date of Birth/Sex: Treating RN: Jun 18, 1970 (51 y.o. 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 The most recent amputation was in August 2022. At that operation, antibiotic beads were placed in the wound. He has been managed by podiatry for his procedures and management of his wounds. He has been in a Water engineer. He is on oral doxycycline. They have been using Betadine wet to dry dressings along with Iodosorb. The patient states that when he thinks the wound is getting too dry, he applies topical Neosporin. At his last visit, on June 7 of this year, the podiatrist determined that he felt the wound was stalled and referred him to wound care for additional evaluation and management. An MRI has been ordered,  but not yet scheduled or performed. Pathology from his operation in August 2022 demonstrated findings consistent with chronic osteomyelitis. Today, there is a large irregular wound on the plantar surface of his right foot, at about the level of the fifth metatarsal head. This tracks through to a pinpoint opening on the dorsal lateral portion of his foot. The intake nurse reported purulent drainage. There is some malodor from the wound. No frank necrosis identified. 07/11/2021: Today, the wounds do not connect. I attempted multiple times from various directions and the shared tunnel is no longer open. He has some slough accumulation on the dorsal part of his foot as well as slough and callus buildup on the plantar surface. His MRI is scheduled for June 29. No  purulent drainage or malodor appreciated today. 07/19/2021: The lateral foot wound has closed and there is no tunnel connecting the plantar foot wound to that site. The plantar foot wound still probes quite deeply, however. There is some slough, eschar, and nonviable tissue accumulated in the wound bed. No malodorous drainage present. His MRI was performed yesterday and is consistent with fairly extensive osteomyelitis. 07/29/2021: The patient has an appointment with infectious disease on July 18 to treat his osteomyelitis. The plantar wound still probes quite deeply, approaching bone. The orifice has narrowed quite substantially, however, making it more difficult for him to pack. 08/05/2021: The tunnel connecting the lateral foot wound to the plantar foot wound has reopened. He sees infectious disease tomorrow to discuss long-term antibiotic treatment for osteomyelitis. There was a bit of murky drainage in the wound, but this was noted after he had had topical lidocaine applied so may have just been a blob of the anesthetic. No odor or frank purulent drainage. The wound probes deeply at the midfoot approaching bone. He does have MRI results confirming his diagnosis of osteomyelitis. He has failed to progress with conventional treatment and I think his best chance for preservation of the foot is to initiate hyperbaric oxygen therapy. Patient History Information obtained from Patient. Social History Never smoker, Marital Status - Married, Alcohol Use - Never, Drug Use - No History, Caffeine Use - Daily - T coffee. ea; Medical History Endocrine Patient has history of Type II Diabetes Hospitalization/Surgery History - Amuptation of 3rd,4th and 5th toes of Right foot;Oral Surgery;Anal Fissure surgery; Cholecystectomy. Objective Constitutional Hypertensive, asymptomatic. No acute distress.. Vitals Time Taken: 4:12 PM, Height: 72 in, Weight: 312 lbs, BMI: 42.3, Temperature: 97.9 F, Pulse: 87 bpm,  Respiratory Rate: 18 breaths/min, Blood Pressure: 157/89 mmHg, Capillary Blood Glucose: 104 mg/dl. Respiratory Normal work of breathing on room air.. General Notes: 08/05/2021: The tunnel connecting the lateral foot wound to the plantar foot wound has reopened. There was a bit of murky drainage in the wound, but this was noted after he had had topical lidocaine applied so may have just been a blob of the anesthetic. No odor or frank purulent drainage. The wound probes deeply at the midfoot approaching bone. Integumentary (Hair, Skin) Wound #2 status is Open. Original cause of wound was Gradually Appeared. The date acquired was: 12/19/2016. The wound has been in treatment 4 weeks. The wound is located on the [place]. The wound measures 0.3cm length x 2cm width x 4.1cm depth; 0.471cm^2 area and 1.932cm^3 volume. There is Fat Layer (Subcutaneous Tissue) exposed. There is a medium amount of serosanguineous drainage noted. The wound margin is thickened. There is large (67-100%) red, friable granulation within the wound bed. There is a small (1-33%) amount of  necrotic tissue within the wound bed. Assessment Active Problems ICD-10 Type 2 diabetes mellitus with other diabetic neurological complication Other chronic osteomyelitis, right ankle and foot Non-pressure chronic ulcer of other part of right foot with necrosis of bone Procedures Wound #2 Pre-procedure diagnosis of Wound #2 is a Diabetic Wound/Ulcer of the Lower Extremity located on the Right,Lateral,Plantar Foot .Severity of Tissue Pre Debridement is: Necrosis of bone. There was a Selective/Open Wound Skin/Epidermis Debridement with a total area of 6 sq cm performed by [name], MD. With the following instrument(s): Curette to remove Non-Viable tissue/material. Material removed includes Callus and Skin: Epidermis and after achieving pain control using Lidocaine 4% Topical Solution. No specimens were taken. A time out  was conducted at 16:35, prior to the start of the procedure. A Minimum amount of bleeding was controlled with Pressure. The procedure was tolerated well with a pain level of 0 throughout and a pain level of 0 following the procedure. Post Debridement Measurements: 0.3cm length x 2cm width x 4.1cm depth; 1.932cm^3 volume. Character of Wound/Ulcer Post Debridement requires further debridement. Severity of Tissue Post Debridement is: Necrosis of bone. Post procedure Diagnosis Wound #2: Same as Pre-Procedure Plan Follow-up Appointments: Return Appointment in 1 week. - Dr Celine Ahr Room 1 [facility] 08/05/21 3:30pm Bathing/ Shower/ Hygiene: Other Bathing/Shower/Hygiene Orders/Instructions: - Change dressing after bathing Off-Loading: Other: - Try to not stand on your feet too much Hyperbaric Oxygen Therapy: Evaluate for HBO Therapy Indication: - wagner grade 3 diabetic foot ulcer right foot 2.0 ATA for 90 Minutes without Air Breaks T Number of Treatments: - 40 otal One treatments per day (delivered Monday through Friday unless otherwise specified in Special Instructions below): Finger stick Blood Glucose Pre- and Post- HBOT Treatment. Follow Hyperbaric Oxygen Glycemia Protocol Afrin (Oxymetazoline HCL) 0.05% nasal spray - 1 spray in both nostrils daily as needed prior to HBO treatment for difficulty clearing ears Radiology ordered were: X-ray, Chest 2 view - pre hyperbaric oxygen therapy CPT ordered were: ECG - pre hyperbaric oxygen therapy WOUND #2: - Foot Wound Laterality: Plantar, Right, Lateral Cleanser: Soap and Water Every Other Day/15 Days Discharge Instructions: May shower and wash wound with dial antibacterial soap and water prior to dressing change. Cleanser: Wound Cleanser (Generic) Every Other Day/15 Days Discharge Instructions: Cleanse the wound with wound cleanser prior to applying a clean dressing using gauze sponges, not tissue or cotton balls. Peri-Wound Care: cotton tipped  applicators (Generic) Every Other Day/15 Days Topical: Gentamicin Every Other Day/15 Days Discharge Instructions: As directed by physician Prim Dressing: KerraCel Ag Gelling Fiber Dressing, 4x5 in (silver alginate) (Generic) Every Other Day/15 Days ary Discharge Instructions: Apply silver alginate to wound bed as instructed Secured With: Paper T ape, 2x10 (in/yd) (Generic) Every Other Day/15 Days Discharge Instructions: Secure dressing with tape as directed. Com pression Wrap: Kerlix Roll 4.5x3.1 (in/yd) (Generic) Every Other Day/15 Days Discharge Instructions: Apply Kerlix and Coban compression as directed. 08/05/2021: The tunnel connecting the lateral foot wound to the plantar foot wound has reopened. There was a bit of murky drainage in the wound, but this was noted after he had had topical lidocaine applied so may have just been a blob of the anesthetic. No odor or frank purulent drainage. The wound probes deeply at the midfoot approaching bone. I debrided callus and nonviable senescent skin from around the orifice of the wound with a curette to facilitate easier packing. We will continue using silver alginate but add the gentamicin back to the regimen. Despite 4 weeks  of maximum conventional care, his wound has actually regressed. I anticipate that he will require 6 weeks of IV antibiotics; he will see infectious disease tomorrow and I anticipate that they will initiate treatment. In the meantime, we will get a chest x-ray and an EKG in order to clear him for hyperbaric oxygen therapy. His laboratory values are within an appropriate range. Follow-up here in 1 week. Electronic Signature(s) Signed: 08/06/2021 5:31:58 PM By: [name] RN, BSN Signed: 08/07/2021 7:33:35 AM By: [name] MD FACS Previous Signature: 08/05/2021 4:55:35 PM Version By: [name] MD FACS Entered By: [name] on 08/06/2021  17:23:41 -------------------------------------------------------------------------------- HxROS Details Patient Name: Date of Service: [name], [name] 08/05/2021 3:30 PM Medical Record Number: [id number] Patient Account Number: [id number] Date of Birth/Sex: Treating RN: [birth year] (51 y.o. [name] Primary Care Provider: [name] Other Clinician: Referring Provider: Treating Provider/Extender: [name] in Treatment: 4 Information Obtained From Patient Endocrine Medical History: Positive for: Type II Diabetes Immunizations Pneumococcal Vaccine: Received Pneumococcal Vaccination: Yes Received Pneumococcal Vaccination On or After 60th Birthday: No Implantable Devices None Hospitalization / Surgery History Type of Hospitalization/Surgery Amuptation of 3rd,4th and 5th toes of Right foot;Oral Surgery;Anal Fissure surgery; Cholecystectomy Family and Social History Never smoker; Marital Status - Married; Alcohol Use: Never; Drug Use: No History; Caffeine Use: Daily - T coffee; Financial Concerns: No; Food, ea; Clothing or Shelter Needs: No; Support System Lacking: No; Transportation Concerns: No Electronic Signature(s) Signed: 08/05/2021 4:56:22 PM By: [name] MD FACS Signed: 08/06/2021 5:31:58 PM By: [name] RN, BSN Entered By: [name] on 08/05/2021 16:52:18 -------------------------------------------------------------------------------- SuperBill Details Patient Name: Date of Service: [name], [name] 08/05/2021 Medical Record Number: [id number] Patient Account Number: [id number] Date of Birth/Sex: Treating RN: 05/05/1970 (51 y.o. 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 Facility Procedures CPT4 Code: [id number] Description: [phone] - DEBRIDE WOUND 1ST 20 SQ CM OR < ICD-10 Diagnosis Description L97.514 Non-pressure chronic ulcer of other part of right foot with necrosis of bone Modifier: Quantity: 1 Physician Procedures : CPT4 Code Description Modifier [id number] - WC PHYS LEVEL 4 - EST PT 25 ICD-10 Diagnosis Description L97.514 Non-pressure chronic ulcer of other part of right foot with necrosis of bone M86.671 Other chronic osteomyelitis, right ankle and foot  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

## 2021-08-09 ENCOUNTER — Ambulatory Visit (HOSPITAL_COMMUNITY)
Admission: RE | Admit: 2021-08-09 | Discharge: 2021-08-09 | Disposition: A | Discharge status: Home / Self Care | Payer: BLUE CROSS/BLUE SHIELD | Source: Ambulatory Visit | Attending: Internal Medicine | Admitting: Internal Medicine

## 2021-08-09 ENCOUNTER — Ambulatory Visit (HOSPITAL_COMMUNITY)
Admission: RE | Admit: 2021-08-09 | Discharge: 2021-08-09 | Disposition: A | Discharge status: Home / Self Care | Payer: BLUE CROSS/BLUE SHIELD | Source: Ambulatory Visit | Attending: General Surgery | Admitting: General Surgery

## 2021-08-09 DIAGNOSIS — M86171 Other acute osteomyelitis, right ankle and foot: Secondary | ICD-10-CM

## 2021-08-09 MED ORDER — HEPARIN SOD (PORK) LOCK FLUSH 100 UNIT/ML IV SOLN
INTRAVENOUS | Status: AC
  Administered 2021-08-09: 500 [IU]
  Filled 2021-08-09: qty 5

## 2021-08-09 MED ORDER — LIDOCAINE HCL 1 % IJ SOLN
INTRAMUSCULAR | Status: AC
  Administered 2021-08-09: 10 mL
  Filled 2021-08-09: qty 20

## 2021-08-12 ENCOUNTER — Encounter (HOSPITAL_BASED_OUTPATIENT_CLINIC_OR_DEPARTMENT_OTHER): Payer: BLUE CROSS/BLUE SHIELD | Admitting: General Surgery

## 2021-08-13 ENCOUNTER — Encounter (HOSPITAL_BASED_OUTPATIENT_CLINIC_OR_DEPARTMENT_OTHER): Payer: BLUE CROSS/BLUE SHIELD | Admitting: General Surgery

## 2021-08-13 ENCOUNTER — Ambulatory Visit (HOSPITAL_COMMUNITY)
Admission: RE | Admit: 2021-08-13 | Discharge: 2021-08-13 | Disposition: A | Discharge status: Home / Self Care | Payer: BLUE CROSS/BLUE SHIELD | Source: Ambulatory Visit | Attending: General Surgery | Admitting: General Surgery

## 2021-08-13 ENCOUNTER — Encounter (HOSPITAL_BASED_OUTPATIENT_CLINIC_OR_DEPARTMENT_OTHER): Payer: BLUE CROSS/BLUE SHIELD | Admitting: Internal Medicine

## 2021-08-13 ENCOUNTER — Other Ambulatory Visit (HOSPITAL_COMMUNITY): Payer: Self-pay | Admitting: General Surgery

## 2021-08-13 DIAGNOSIS — E11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ical complication: Secondary | ICD-10-CM | POA: Diagnosis present

## 2021-08-13 DIAGNOSIS — M5410 Radiculopathy, site unspecified: Secondary | ICD-10-CM | POA: Insufficient documentation

## 2021-08-13 DIAGNOSIS — E11621 Type 2 diabetes mellitus with foot ulcer: Secondary | ICD-10-CM | POA: Diagnosis not present

## 2021-08-13 NOTE — Progress Notes (Signed)
[name], Filemon M. ([id number]) Visit Report for 08/13/2021 Arrival Information Details Patient Name: Date of Service: [place],  [name] 08/13/2021 2:00 PM Medical Record Number: [id number] Patient Account Number: [id number] Date of Birth/Sex: Treating RN: Jul 26, 1970 (51 y.o. [name] Primary Care Kashius Dominic: [name] Other Clinician: Referring Malikah Principato: Treating Mariyah Upshaw/Extender: [name] in Treatment: 5 Visit Information History Since Last Visit Added or deleted any medications: Yes Patient Arrived: Ambulatory Any new allergies or adverse reactions: No Arrival Time: 13:46 Had a fall or experienced change in No Accompanied By: self activities of daily living that may affect Transfer Assistance: None risk of falls: Patient Identification Verified: Yes Signs or symptoms of abuse/neglect since last visito No Secondary Verification Process Completed: Yes Hospitalized since last visit: No Patient Requires Transmission-Based Precautions: No Implantable device outside of the clinic excluding No Patient Has Alerts: Yes cellular tissue based products placed in the center Patient Alerts: ABI R 1.17 since last visit: ABI L 1.29 Has Dressing in Place as Prescribed: Yes R TBI 1.03 Pain Present Now: No L TBI 0.77 Electronic Signature(s) Signed: 08/13/2021 4:29:40 PM By: Adline Peals Entered By: Adline Peals on 08/13/2021 13:49:23 -------------------------------------------------------------------------------- Lower Extremity Assessment Details Patient Name: Date of Service: [place], [name] 08/13/2021 2:00 PM Medical Record Number: [id number] Patient Account Number: [id number] Date of Birth/Sex: Treating RN: Apr 29, 1970 (51 y.o. [name] Primary Care Bane Hagy: [name] Other Clinician: Referring Adrena Nakamura: Treating Yenty Bloch/Extender: [name] in Treatment: 5 Edema  Assessment Assessed: [name]: No] [Right: No] Edema: [Left: N] [Right: o] Calf Left: Right: Point of Measurement: From Medial Instep 40.3 cm Ankle Left: Right: Point of Measurement: From Medial Instep 25 cm Vascular Assessment Pulses: Dorsalis Pedis Palpable: [Right:Yes] Electronic Signature(s) Signed: 08/13/2021 4:29:40 PM By: Adline Peals Entered By: Adline Peals on 08/13/2021 13:52:59 -------------------------------------------------------------------------------- Multi Wound Chart Details Patient Name: Date of Service: [name], [name] 08/13/2021 2:00 PM Medical Record Number: [id number] Patient Account Number: [id number] Date of Birth/Sex: Treating RN: Oct 09, 1970 (51 y.o. [name] Primary Care Victor Granados: [name] Other Clinician: Referring Willet Schleifer: Treating Chaise Mahabir/Extender: [name] in Treatment: 5 Vital Signs Height(in): 72 Capillary Blood Glucose(mg/dl): 119 Weight(lbs): 312 Pulse(bpm): 99 Body Mass Index(BMI): 42.3 Blood Pressure(mmHg): 143/78 Temperature(F): 98.7 Respiratory Rate(breaths/min): 18 Photos: [N/A:N/A] Right, Lateral, Plantar Foot N/A N/A Wound Location: Gradually Appeared N/A N/A Wounding Event: Diabetic Wound/Ulcer of the Lower N/A N/A Primary Etiology: Extremity Type II Diabetes N/A N/A Comorbid History: 12/19/2016 N/A N/A Date Acquired: 5 N/A N/A Weeks of Treatment: Open N/A N/A Wound Status: No N/A N/A Wound Recurrence: 0.3x2.5x2 N/A N/A Measurements L x W x D (cm) 0.589 N/A N/A A (cm) : rea 1.178 N/A N/A Volume (cm) : 83.70% N/A N/A % Reduction in A rea: 93.50% N/A N/A % Reduction in Volume: Grade 3 N/A N/A Classification: Medium N/A N/A Exudate A mount: Serosanguineous N/A N/A Exudate Type: red, brown N/A N/A Exudate Color: Thickened N/A N/A Wound Margin: Medium (34-66%) N/A N/A Granulation A mount: Red, Friable N/A N/A Granulation Quality: Medium  (34-66%) N/A N/A Necrotic A mount: Fat Layer (Subcutaneous Tissue): Yes N/A N/A Exposed Structures: Fascia: No Tendon: No Muscle: No Joint: No Bone: No Small (1-33%) N/A N/A Epithelialization: Debridement - Excisional N/A N/A Debridement: Pre-procedure Verification/Time Out 14:09 N/A N/A Taken: Lidocaine 5% topical ointment N/A N/A Pain Control: Callus, Subcutaneous N/A N/A Tissue Debrided: Skin/Subcutaneous Tissue N/A N/A Level: 0.75 N/A N/A Debridement A (sq cm):  rea Curette N/A N/A Instrument: Moderate N/A N/A Bleeding: Pressure N/A N/A Hemostasis Achieved: Procedure was tolerated well N/A N/A Debridement Treatment Response: 0.3x2.5x2 N/A N/A Post Debridement Measurements L x W x D (cm) 1.178 N/A N/A Post Debridement Volume: (cm) Debridement N/A N/A Procedures Performed: Treatment Notes Electronic Signature(s) Signed: 08/13/2021 2:18:33 PM By: [name] MD FACS Signed: 08/13/2021 4:42:49 PM By: [name] RN, BSN Entered By: [name] on 08/13/2021 14:18:33 -------------------------------------------------------------------------------- Multi-Disciplinary Care Plan Details Patient Name: Date of Service: [place], [name] 08/13/2021 2:00 PM Medical Record Number: [id number] Patient Account Number: [id number] Date of Birth/Sex: Treating RN: Jan 02, 1971 (51 y.o. [name] Primary Care Tino Ronan: [name] Other Clinician: Referring Karsynn Deweese: Treating Antonio Creswell/Extender: [name] in Treatment: [place] reviewed with physician Active Inactive Nutrition Nursing Diagnoses: Impaired glucose control: actual or potential Potential for alteratiion in Nutrition/Potential for imbalanced nutrition Goals: Patient/caregiver will maintain therapeutic glucose control Date Initiated: 07/29/2021 Target Resolution Date: 08/26/2021 Goal Status: Active Interventions: Assess patient  nutrition upon admission and as needed per policy Provide education on elevated blood sugars and impact on wound healing Treatment Activities: Patient referred to Primary Care Physician for further nutritional evaluation : 07/29/2021 Notes: Osteomyelitis Nursing Diagnoses: Infection: osteomyelitis Knowledge deficit related to disease process and management Goals: Patient/caregiver will verbalize understanding of disease process and disease management Date Initiated: 07/29/2021 Target Resolution Date: 08/26/2021 Goal Status: Active Patient's osteomyelitis will resolve Date Initiated: 07/29/2021 Target Resolution Date: 08/26/2021 Goal Status: Active Interventions: Assess for signs and symptoms of osteomyelitis resolution every visit Provide education on osteomyelitis Treatment Activities: Consult for HBO : 07/29/2021 Notes: Wound/Skin Impairment Nursing Diagnoses: Impaired tissue integrity Goals: Patient/caregiver will verbalize understanding of skin care regimen Date Initiated: 07/04/2021 Target Resolution Date: 09/10/2021 Goal Status: Active Interventions: Assess ulceration(s) every visit Treatment Activities: Skin care regimen initiated : 07/04/2021 Notes: Electronic Signature(s) Signed: 08/13/2021 2:24:13 PM By: Blanche East RN Signed: 08/13/2021 4:29:40 PM By: Adline Peals Entered By: Blanche East on 08/13/2021 14:15:35 -------------------------------------------------------------------------------- Pain Assessment Details Patient Name: Date of Service: [name], [name] 08/13/2021 2:00 PM Medical Record Number: [id number] Patient Account Number: [id number] Date of Birth/Sex: Treating RN: 19-Jan-1971 (51 y.o. [name] Primary Care Kresha Abelson: [name] Other Clinician: Referring Emersyn Kotarski: Treating Charlotte Brafford/Extender: [name] in Treatment: 5 Active Problems Location of Pain Severity and Description of Pain Patient Has  Paino No Site Locations Rate the pain. Current Pain Level: 0 Pain Management and Medication Current Pain Management: Electronic Signature(s) Signed: 08/13/2021 4:29:40 PM By: Adline Peals Entered By: Adline Peals on 08/13/2021 13:50:03 -------------------------------------------------------------------------------- Patient/Caregiver Education Details Patient Name: Date of Service: [place], [name] 7/25/2023andnbsp2:00 PM Medical Record Number: [id number] Patient Account Number: [id number] Date of Birth/Gender: Treating RN: 1970/12/27 (51 y.o. [name] Primary Care Physician: [name] Other Clinician: Referring Physician: Treating Physician/Extender: [name] in Treatment: 5 Education Assessment Education Provided To: Patient Education Topics Provided Elevated Blood Sugar/ Impact on Healing: Methods: Explain/Verbal Responses: Reinforcements needed, State content correctly Infection: Methods: Explain/Verbal Responses: Reinforcements needed, State content correctly Wound/Skin Impairment: Methods: Explain/Verbal Responses: Reinforcements needed, State content correctly Electronic Signature(s) Signed: 08/13/2021 2:24:13 PM By: Blanche East RN Entered By: Blanche East on 08/13/2021 14:15:59 -------------------------------------------------------------------------------- Wound Assessment Details Patient Name: Date of Service: [place], [name] 08/13/2021 2:00 PM Medical Record Number: [id number] Patient Account Number: [id number] Date of Birth/Sex: Treating RN: 25-Feb-1970 (51 y.o. [name] Primary Care  Holbert Caples: [name] Other Clinician: Referring Brunetta Newingham: Treating Askari Kinley/Extender: [name] in Treatment: 5 Wound Status Wound Number: 2 Primary Etiology: Diabetic Wound/Ulcer of the Lower Extremity Wound Location: Right, Lateral, Plantar Foot Wound Status:  Open Wounding Event: Gradually Appeared Comorbid History: Type II Diabetes Date Acquired: 12/19/2016 Weeks Of Treatment: 5 Clustered Wound: No Photos Wound Measurements Length: (cm) 0.3 Width: (cm) 2.5 Depth: (cm) 2 Area: (cm) 0.589 Volume: (cm) 1.178 % Reduction in Area: 83.7% % Reduction in Volume: 93.5% Epithelialization: Small (1-33%) Tunneling: No Undermining: No Wound Description Classification: Grade 3 Wound Margin: Thickened Exudate Amount: Medium Exudate Type: Serosanguineous Exudate Color: red, brown Foul Odor After Cleansing: No Slough/Fibrino No Wound Bed Granulation Amount: Medium (34-66%) Exposed Structure Granulation Quality: Red, Friable Fascia Exposed: No Necrotic Amount: Medium (34-66%) Fat Layer (Subcutaneous Tissue) Exposed: Yes Necrotic Quality: Adherent Slough Tendon Exposed: No Muscle Exposed: No Joint Exposed: No Bone Exposed: No Electronic Signature(s) Signed: 08/13/2021 2:24:13 PM By: Blanche East RN Signed: 08/13/2021 4:29:40 PM By: Adline Peals Entered By: Blanche East on 08/13/2021 14:05:27 -------------------------------------------------------------------------------- Wound Assessment Details Patient Name: Date of Service: [name], [name] 08/13/2021 2:00 PM Medical Record Number: [id number] Patient Account Number: [id number] Date of Birth/Sex: Treating RN: [birth year] (51 y.o. [name] Primary Care Craige Patel: [name] Other Clinician: Referring Fatimata Talsma: Treating Tigerlily Christine/Extender: [name] in Treatment: 5 Wound Status Wound Number: 3R Primary Etiology: Diabetic Wound/Ulcer of the Lower Extremity Wound Location: Right, Lateral Foot Wound Status: Open Wounding Event: Gradually Appeared Comorbid History: Type II Diabetes Date Acquired: 05/24/2016 Weeks Of Treatment: 4 Clustered Wound: No Photos Wound Measurements Length: (cm) 1.3 Width: (cm) 1 Depth: (cm) 0.7 Area:  (cm) 1.021 Volume: (cm) 0.715 % Reduction in Area: -765.3% % Reduction in Volume: -505.9% Epithelialization: Large (67-100%) Tunneling: Yes Position (o'clock): 5 Maximum Distance: (cm) 2 Undermining: No Wound Description Classification: Grade 2 Wound Margin: Distinct, outline attached Exudate Amount: None Present Foul Odor After Cleansing: No Slough/Fibrino No Wound Bed Granulation Amount: Large (67-100%) Exposed Structure Necrotic Amount: None Present (0%) Fascia Exposed: No Fat Layer (Subcutaneous Tissue) Exposed: No Tendon Exposed: No Muscle Exposed: No Joint Exposed: No Bone Exposed: No Electronic Signature(s) Signed: 08/13/2021 2:24:13 PM By: Blanche East RN Signed: 08/13/2021 4:42:49 PM By: [name] RN, BSN Entered By: Blanche East on 08/13/2021 14:21:28 -------------------------------------------------------------------------------- [place] Details Patient Name: Date of Service: [name], [name] 08/13/2021 2:00 PM Medical Record Number: [id number] Patient Account Number: [id number] Date of Birth/Sex: Treating RN: 08-14-1970 (51 y.o. [name] Primary Care Duha Abair: [name] Other Clinician: Referring Silvio Sausedo: Treating Jondavid Schreier/Extender: [name] in Treatment: 5 Vital Signs Time Taken: 15:49 Temperature (F): 98.7 Height (in): 72 Pulse (bpm): 99 Weight (lbs): 312 Respiratory Rate (breaths/min): 18 Body Mass Index (BMI): 42.3 Blood Pressure (mmHg): 143/78 Capillary Blood Glucose (mg/dl): 119 Reference Range: 80 - 120 mg / dl Electronic Signature(s) Signed: 08/13/2021 4:29:40 PM By: Adline Peals Entered By: Adline Peals on 08/13/2021 13:49:58

## 2021-08-14 ENCOUNTER — Encounter (HOSPITAL_BASED_OUTPATIENT_CLINIC_OR_DEPARTMENT_OTHER): Payer: BLUE CROSS/BLUE SHIELD | Admitting: General Surgery

## 2021-08-14 DIAGNOSIS — E11621 Type 2 diabetes mellitus with foot ulcer: Secondary | ICD-10-CM | POA: Diagnosis not present

## 2021-08-14 LAB — GLUCOSE, CAPILLARY
Glucose-Capillary: 150 mg/dL — ABNORMAL HIGH (ref 70–99)
Glucose-Capillary: 181 mg/dL — ABNORMAL HIGH (ref 70–99)

## 2021-08-14 NOTE — Progress Notes (Signed)
[name], [name] ([id number]) Visit Report for 08/13/2021 Chief Complaint Document Details Patient Name: Date of Service: [name], [name] 08/13/2021 2:00 PM Medical Record Number: [id number] Patient Account Number: [id number] Date of Birth/Sex: Treating RN: 31-Jul-1970 [name] Primary Care Provider: [name] Other Clinician: Referring Provider: Treating Provider/Extender: [name] in Treatment: 5 Information Obtained from: Patient Chief Complaint Patients presents for treatment of an open diabetic ulcer Electronic Signature(s) Signed: 08/13/2021 2:18:39 PM By: [name] MD FACS Entered By: [name] on 08/13/2021 14:18:39 -------------------------------------------------------------------------------- Debridement Details Patient Name: Date of Service: [place], [name] 08/13/2021 2:00 PM Medical Record Number: [id number] Patient Account Number: [id number] Date of Birth/Sex: Treating RN: 1970-08-23 [name] Primary Care Provider: [name] Other Clinician: Referring Provider: Treating Provider/Extender: [name] in Treatment: 5 Debridement Performed for Assessment: Wound #2 [name] Performed By: Physician [name], MD Debridement Type: Debridement Severity of Tissue Pre Debridement: Fat layer exposed Level of Consciousness (Pre-procedure): Awake and Alert Pre-procedure Verification/Time Out Yes - 14:09 Taken: Start Time: 14:09 Pain Control: Lidocaine 5% topical ointment T Area Debrided (L x W): otal 0.3 (cm) x 2.5 (cm) = 0.75 (cm) Tissue and other material debrided: Viable, Non-Viable, Callus, Subcutaneous, Skin: Epidermis Level: Skin/Subcutaneous Tissue Debridement Description: Excisional Instrument: Curette Bleeding: Moderate Hemostasis Achieved: Pressure Response to Treatment: Procedure was tolerated well Level of  Consciousness (Post- Awake and Alert procedure): Post Debridement Measurements of Total Wound Length: (cm) 0.3 Width: (cm) 2.5 Depth: (cm) 2 Volume: (cm) 1.178 Character of Wound/Ulcer Post Debridement: Stable Severity of Tissue Post Debridement: Fat layer exposed Post Procedure Diagnosis Same as Pre-procedure Electronic Signature(s) Signed: 08/13/2021 2:24:13 PM By: Blanche East RN Signed: 08/13/2021 4:42:49 PM By: [name] RN, BSN Signed: 08/14/2021 7:34:21 AM By: [name] MD FACS Entered By: Blanche East on 08/13/2021 14:12:27 -------------------------------------------------------------------------------- HPI Details Patient Name: Date of Service: [name], [name] 08/13/2021 2:00 PM Medical Record Number: [id number] Patient Account Number: [id number] Date of Birth/Sex: Treating RN: 1970-11-14 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 The most recent amputation was in August 2022. At that operation, antibiotic beads were placed in the wound. He has been managed by podiatry for his procedures and management of his wounds. He has been in a Water engineer. He is on oral doxycycline. They have been using Betadine wet to dry dressings along with Iodosorb. The patient states that when he thinks the wound is getting too dry, he applies topical Neosporin. At his last visit, on June 7 of this year, the podiatrist determined that he felt the wound was stalled and referred him to wound care for additional evaluation and management. An MRI has been ordered, but not yet scheduled or  performed. Pathology from his operation in August 2022 demonstrated findings consistent with chronic osteomyelitis. Today, there is a large irregular wound on the plantar surface of his right foot, at about the level of the fifth metatarsal head. This tracks through to a pinpoint opening on the dorsal lateral portion of his foot. The intake nurse reported purulent drainage. There is some malodor from the  wound. No frank necrosis identified. 07/11/2021: Today, the wounds do not connect. I attempted multiple times from various directions and the shared tunnel is no longer open. He has some slough accumulation on the dorsal part of his foot as well as slough and callus buildup on the plantar surface. His MRI is scheduled for June 29. No purulent drainage or malodor appreciated today. 07/19/2021: The lateral foot wound has closed and there is no tunnel connecting the plantar foot wound to that site. The plantar foot wound still probes quite deeply, however. There is some slough, eschar, and nonviable tissue accumulated in the wound bed. No malodorous drainage present. His MRI was performed yesterday and is consistent with fairly extensive osteomyelitis. 07/29/2021: The patient has an appointment with infectious disease on July 18 to treat his osteomyelitis. The plantar wound still probes quite deeply, approaching bone. The orifice has narrowed quite substantially, however, making it more difficult for him to pack. 08/05/2021: The tunnel connecting the lateral foot wound to the plantar foot wound has reopened. He sees infectious disease tomorrow to discuss long-term antibiotic treatment for osteomyelitis. There was a bit of murky drainage in the wound, but this was noted after he had had topical lidocaine applied so may have just been a blob of the anesthetic. No odor or frank purulent drainage. The wound probes deeply at the midfoot approaching bone. He does have MRI results confirming his diagnosis of  osteomyelitis. He has failed to progress with conventional treatment and I think his best chance for preservation of the foot is to initiate hyperbaric oxygen therapy. 08/13/2021: The tunnel connecting the 2 wounds has closed again. He has a PICC line and is getting IV daptomycin and cefepime. EKG and chest x-ray are within normal limits. He is scheduled to start hyperbaric oxygen therapy tomorrow. The wound in his midfoot probes deeply, approaching bone. The skin at the orifice continues to heaped up and threatens to close over despite the large cavity within. No erythema, induration, or purulent drainage. The wound on his lateral foot is fairly small and quite clean. Electronic Signature(s) Signed: 08/13/2021 2:19:52 PM By: [name] MD FACS Entered By: [name] on 08/13/2021 14:19:52 -------------------------------------------------------------------------------- Physical Exam Details Patient Name: Date of Service: [name], [name] 08/13/2021 2:00 PM Medical Record Number: [id number] Patient Account Number: [id number] Date of Birth/Sex: Treating RN: 1970-04-24 [name] Primary Care Provider: Other Clinician: [name] Referring Provider: Treating Provider/Extender: [name] in Treatment: 5 Constitutional Slightly hypertensive. . . . No acute distress.Marland Kitchen Respiratory Normal work of breathing on room air.. Notes 08/13/2021: The tunnel connecting the 2 wounds has closed again. The wound in his midfoot probes deeply, approaching bone. The skin at the orifice continues to heaped up and threatens to close over despite the large cavity within. No erythema, induration, or purulent drainage. The wound on his lateral foot is fairly small and quite clean. Electronic Signature(s) Signed: 08/13/2021 2:20:41 PM By: [name] MD FACS Entered By: [name] on 08/13/2021  14:20:41 -------------------------------------------------------------------------------- Physician Orders Details Patient Name: Date of Service: [place], [name] 08/13/2021 2:00 PM Medical Record Number: [id number] Patient Account Number: [id number] Date of Birth/Sex: Treating RN: [birth year] 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 - Dr Celine Ahr Room 4 per [name] Return A Mon 08/19/21 @ 10:00am after HBO Bathing/ Shower/ Hygiene Other Bathing/Shower/Hygiene Orders/Instructions: - Change dressing after bathing Off-Loading Other: - Try to not stand on your feet too much Hyperbaric Oxygen Therapy Evaluate for HBO Therapy Indication: - wagner grade 3 diabetic foot ulcer right foot 2.0 ATA for 90 Minutes without A Breaks ir Total Number of Treatments: - 40 One treatments per day (delivered Monday through Friday unless otherwise specified in Special Instructions below): Finger stick Blood Glucose Pre- and Post- HBOT Treatment. Follow Hyperbaric Oxygen Glycemia Protocol Afrin (Oxymetazoline HCL) 0.05% nasal spray - 1 spray in both nostrils daily as needed prior to HBO treatment for difficulty clearing ears Wound Treatment Wound #2 - Foot Wound Laterality: Plantar, Right, Lateral Cleanser: Soap and Water Every Other Day/15 Days Discharge Instructions: May shower and wash wound with dial antibacterial soap and water prior to dressing change. Cleanser: Wound Cleanser (Generic) Every Other Day/15 Days Discharge  Instructions: Cleanse the wound with wound cleanser prior to applying a clean dressing using gauze sponges, not tissue or cotton balls. Peri-Wound Care: cotton tipped applicators (Generic) Every Other Day/15 Days Topical: Gentamicin Every Other Day/15 Days Discharge Instructions: As directed by physician Prim Dressing: KerraCel Ag Gelling Fiber Dressing, 4x5 in (silver alginate) (Generic) Every Other Day/15 Days ary Discharge Instructions: Apply silver alginate to wound bed as instructed Secured With: Paper Tape, 2x10 (in/yd) (Generic) Every Other Day/15 Days Discharge Instructions: Secure dressing with tape as directed. Compression Wrap: Kerlix Roll 4.5x3.1 (in/yd) (Generic) Every Other Day/15 Days Discharge Instructions: Apply Kerlix and Coban compression as directed. GLYCEMIA INTERVENTIONS PROTOCOL PRE-HBO GLYCEMIA INTERVENTIONS ACTION INTERVENTION Obtain pre-HBO capillary blood glucose (ensure 1 physician order is in chart). A. Notify HBO physician and await physician orders. 2 If result is 70 mg/dl or below: B. If the result meets the hospital definition of a critical result, follow hospital policy. A. Give patient an 8 ounce Glucerna Shake, an 8 ounce Ensure, or 8 ounces of a Glucerna/Ensure equivalent dietary supplement*. B. Wait 30 minutes. If result is 71 mg/dl to 130 mg/dl: C. Retest patients capillary blood glucose (CBG). D. If result greater than or equal to 110 mg/dl, proceed with HBO. If result less than 110 mg/dl, notify HBO physician and consider holding HBO. If result is 131 mg/dl to 249 mg/dl: A. Proceed with HBO. A. Notify HBO physician and await physician orders. B. It is recommended to hold HBO and do If result is 250 mg/dl or greater: blood/urine ketone testing. C. If the result meets the hospital definition of a critical result, follow hospital policy. POST-HBO GLYCEMIA INTERVENTIONS ACTION INTERVENTION Obtain post HBO capillary blood glucose  (ensure 1 physician order is in chart). A. Notify HBO physician and await physician orders. 2 If result is 70 mg/dl or below: B. If the result meets the hospital definition of a critical result, follow hospital policy. A. Give patient an 8 ounce Glucerna Shake, an 8 ounce Ensure, or 8 ounces of a Glucerna/Ensure equivalent dietary supplement*. B. Wait 15 minutes for symptoms of If result is 71 mg/dl to 100 mg/dl: hypoglycemia (i.e. nervousness, anxiety, sweating, chills, clamminess, irritability, confusion, tachycardia or dizziness). C. If patient asymptomatic, discharge patient. If patient symptomatic, repeat capillary blood glucose (CBG) and notify HBO physician. If result is 101 mg/dl to 249 mg/dl: A. Discharge  patient. A. Notify HBO physician and await physician orders. B. It is recommended to do blood/urine ketone If result is 250 mg/dl or greater: testing. C. If the result meets the hospital definition of a critical result, follow hospital policy. *Juice or candies are NOT equivalent products. If patient refuses the Glucerna or Ensure, please consult the hospital dietitian for an appropriate substitute. Electronic Signature(s) Signed: 08/14/2021 7:34:21 AM By: [name] MD FACS Entered By: [name] on 08/13/2021 14:21:07 -------------------------------------------------------------------------------- Problem List Details Patient Name: Date of Service: [name], [name] 08/13/2021 2:00 PM Medical Record Number: [id number] Patient Account Number: [id number] Date of Birth/Sex: Treating RN: 1970/08/17 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 08/13/2021 2:00 PM Medical Record Number: [id number] Patient Account Number: [id number] Date of Birth/Sex: Treating RN: December 14, 1970 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 The most recent amputation was in August 2022. At that operation, antibiotic beads were placed in the wound. He has been managed by podiatry for his procedures and management of his wounds. He has been in a Water engineer. He is on oral doxycycline. They have been using Betadine wet to dry dressings along with Iodosorb. The patient states that when he thinks the wound is getting too dry, he applies topical Neosporin. At his last visit, on June 7 of this year, the podiatrist determined that he felt the wound was stalled and referred him to wound care for additional evaluation and management. An MRI has been ordered, but not yet scheduled or performed. Pathology from his operation in August 2022 demonstrated findings consistent with  chronic osteomyelitis. Today, there is a large irregular wound on the plantar surface of his right foot, at about the level of the fifth metatarsal head. This tracks through to a pinpoint opening on the dorsal lateral portion of his foot. The intake nurse reported purulent drainage. There is some malodor from the wound. No frank necrosis identified. 07/11/2021: Today, the wounds do not connect. I attempted multiple times from various directions and the shared tunnel is no longer open. He has some slough accumulation on the dorsal part of his foot as well as slough and callus buildup on the plantar surface. His MRI is scheduled for June 29. No purulent drainage or malodor appreciated today. 07/19/2021: The lateral foot wound has closed and there is no tunnel connecting the plantar foot wound to that site. The plantar foot wound still probes quite deeply, however. There is some slough, eschar, and nonviable  tissue accumulated in the wound bed. No malodorous drainage present. His MRI was performed yesterday and is consistent with fairly extensive osteomyelitis. 07/29/2021: The patient has an appointment with infectious disease on July 18 to treat his osteomyelitis. The plantar wound still probes quite deeply, approaching bone. The orifice has narrowed quite substantially, however, making it more difficult for him to pack. 08/05/2021: The tunnel connecting the lateral foot wound to the plantar foot wound has reopened. He sees infectious disease tomorrow to discuss long-term antibiotic treatment for osteomyelitis. There was a bit of murky drainage in the wound, but this was noted after he had had topical lidocaine applied so may have just been a blob of the anesthetic. No odor or frank purulent drainage. The wound probes deeply at the midfoot approaching bone. He does have MRI results confirming his diagnosis of osteomyelitis. He has failed to progress with conventional treatment and I think his best chance  for preservation of the foot is to initiate hyperbaric oxygen therapy. 08/13/2021: The tunnel connecting the 2 wounds has closed again. He has a PICC line and is getting IV daptomycin and cefepime. EKG and chest x-ray are within normal limits. He is scheduled to start hyperbaric oxygen therapy tomorrow. The wound in his midfoot probes deeply, approaching bone. The skin at the orifice continues to heaped up and threatens to close over despite the large cavity within. No erythema, induration, or purulent drainage. The wound on his lateral foot is fairly small and quite clean. Patient History Information obtained from Patient. Social History Never smoker, Marital Status - Married, Alcohol Use - Never, Drug Use - No History, Caffeine Use - Daily - T coffee. ea; Medical History Endocrine Patient has history of Type II Diabetes Hospitalization/Surgery History - Amuptation of 3rd,4th and 5th toes of Right foot;Oral Surgery;Anal Fissure surgery; Cholecystectomy. Objective Constitutional Slightly hypertensive. No acute distress.. Vitals Time Taken: 3:49 PM, Height: 72 in, Weight: 312 lbs, BMI: 42.3, Temperature: 98.7 F, Pulse: 99 bpm, Respiratory Rate: 18 breaths/min, Blood Pressure: 143/78 mmHg, Capillary Blood Glucose: 119 mg/dl. Respiratory Normal work of breathing on room air.. General Notes: 08/13/2021: The tunnel connecting the 2 wounds has closed again. The wound in his midfoot probes deeply, approaching bone. The skin at the orifice continues to heaped up and threatens to close over despite the large cavity within. No erythema, induration, or purulent drainage. The wound on his lateral foot is fairly small and quite clean. Integumentary (Hair, Skin) Wound #2 status is Open. Original cause of wound was Gradually Appeared. The date acquired was: 12/19/2016. The wound has been in treatment 5 weeks. The wound is located on the [place]. The wound measures 0.3cm length x 2.5cm  width x 2cm depth; 0.589cm^2 area and 1.178cm^3 volume. There is Fat Layer (Subcutaneous Tissue) exposed. There is no tunneling or undermining noted. There is a medium amount of serosanguineous drainage noted. The wound margin is thickened. There is medium (34-66%) red, friable granulation within the wound bed. There is a medium (34-66%) amount of necrotic tissue within the wound bed including Adherent Slough. Wound #3R status is Open. Original cause of wound was Gradually Appeared. The date acquired was: 05/24/2016. The wound has been in treatment 4 weeks. The wound is located on the Right,Lateral Foot. The wound measures 1.3cm length x 1cm width x 0.7cm depth; 1.021cm^2 area and 0.715cm^3 volume. There is no undermining noted, however, there is tunneling at 5:00 with a maximum distance of 2cm. There is a none present amount of drainage  noted. The wound margin is distinct with the outline attached to the wound base. There is large (67-100%) granulation within the wound bed. There is no necrotic tissue within the wound bed. 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 There was a Excisional Skin/Subcutaneous Tissue Debridement with a total area of 0.75 sq cm performed by [name], MD. With the following instrument(s): Curette to remove Viable and Non-Viable tissue/material. Material removed includes Callus, Subcutaneous Tissue, and Skin: Epidermis after achieving pain control using Lidocaine 5% topical ointment. No specimens were taken. A time out was conducted at 14:09, prior to the start of the  procedure. A Moderate amount of bleeding was controlled with Pressure. The procedure was tolerated well. Post Debridement Measurements: 0.3cm length x 2.5cm width x 2cm depth; 1.178cm^3 volume. Character of Wound/Ulcer Post Debridement is stable. Severity of Tissue Post Debridement is: Fat layer exposed. Post procedure Diagnosis Wound #2: Same as Pre-Procedure Plan Follow-up Appointments: Return Appointment in 1 week. - Dr Celine Ahr Room 4 per [employer] 08/19/21 @ 10:00am after HBO Bathing/ Shower/ Hygiene: Other Bathing/Shower/Hygiene Orders/Instructions: - Change dressing after bathing Off-Loading: Other: - Try to not stand on your feet too much Hyperbaric Oxygen Therapy: Evaluate for HBO Therapy Indication: - wagner grade 3 diabetic foot ulcer right foot 2.0 ATA for 90 Minutes without Air Breaks T Number of Treatments: - 40 otal One treatments per day (delivered Monday through Friday unless otherwise specified in Special Instructions below): Finger stick Blood Glucose Pre- and Post- HBOT Treatment. Follow Hyperbaric Oxygen Glycemia Protocol Afrin (Oxymetazoline HCL) 0.05% nasal spray - 1 spray in both nostrils daily as needed prior to HBO treatment for difficulty clearing ears WOUND #2: - Foot Wound Laterality: Plantar, Right, Lateral Cleanser: Soap and Water Every Other Day/15 Days Discharge Instructions: May shower and wash wound with dial antibacterial soap and water prior to dressing change. Cleanser: Wound Cleanser (Generic) Every Other Day/15 Days Discharge Instructions: Cleanse the wound with wound cleanser prior to applying a clean dressing using gauze sponges, not tissue or cotton balls. Peri-Wound Care: cotton tipped applicators (Generic) Every Other Day/15 Days Topical: Gentamicin Every Other Day/15 Days Discharge Instructions: As directed by physician Prim Dressing: KerraCel Ag Gelling Fiber Dressing, 4x5 in (silver alginate) (Generic) Every Other Day/15 Days ary Discharge  Instructions: Apply silver alginate to wound bed as instructed Secured With: Paper T ape, 2x10 (in/yd) (Generic) Every Other Day/15 Days Discharge Instructions: Secure dressing with tape as directed. Com pression Wrap: Kerlix Roll 4.5x3.1 (in/yd) (Generic) Every Other Day/15 Days Discharge Instructions: Apply Kerlix and Coban compression as directed. 08/13/2021: The tunnel connecting the 2 wounds has closed again. The wound in his midfoot probes deeply, approaching bone. The skin at the orifice continues to heaped up and threatens to close over despite the large cavity within. No erythema, induration, or purulent drainage. The wound on his lateral foot is fairly small and quite clean. I used a curette to debride some dead skin from the lateral foot wound. I also debrided the senescent skin from the plantar foot wound to keep the orifice open and accessible. Some nonviable subcutaneous tissue was also removed in similar fashion. We will initiate hyperbaric oxygen  therapy tomorrow. We will continue gentamicin and silver alginate dressings. Follow-up in 1 week. Electronic Signature(s) Signed: 08/13/2021 2:21:56 PM By: [name] MD FACS Entered By: [name] on 08/13/2021 14:21:56 -------------------------------------------------------------------------------- HxROS Details Patient Name: Date of Service: [place], [name] 08/13/2021 2:00 PM Medical Record Number: [id number] Patient Account Number: [id number] Date of Birth/Sex: Treating RN: August 14, 1970 [name] Primary Care Provider: [name] Other Clinician: Referring Provider: Treating Provider/Extender: [name] in Treatment: 5 Information Obtained From Patient Endocrine Medical History: Positive for: Type II Diabetes Immunizations Pneumococcal Vaccine: Received Pneumococcal Vaccination: Yes Received Pneumococcal Vaccination On or After 60th Birthday: No Implantable  Devices None Hospitalization / Surgery History Type of Hospitalization/Surgery Amuptation of 3rd,4th and 5th toes of Right foot;Oral Surgery;Anal Fissure surgery; Cholecystectomy Family and Social History Never smoker; Marital Status - Married; Alcohol Use: Never; Drug Use: No History; Caffeine Use: Daily - T coffee; Financial Concerns: No; Food, ea; Clothing or Shelter Needs: No; Support System Lacking: No; Transportation Concerns: No Electronic Signature(s) Signed: 08/13/2021 4:42:49 PM By: [name] RN, BSN Signed: 08/14/2021 7:34:21 AM By: [name] MD FACS Entered By: [name] on 08/13/2021 14:20:01 -------------------------------------------------------------------------------- SuperBill Details Patient Name: Date of Service: [name], [name] 08/13/2021 Medical Record Number: [id number] Patient Account Number: [id number] Date of Birth/Sex: Treating RN: Jun 29, 1970 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 A07.622 Other chronic osteomyelitis, right ankle and foot L97.514 Non-pressure chronic ulcer of other part of right foot with necrosis of bone Facility Procedures CPT4 Code: 63335456 Description: 25638 - DEB SUBQ TISSUE 20 SQ CM/< ICD-10 Diagnosis Description L97.514 Non-pressure chronic ulcer of other part of right foot with necrosis of bone Modifier: Quantity: 1 Physician Procedures : CPT4 Code Description Modifier [id number] - WC PHYS LEVEL 4 - EST PT 25 ICD-10 Diagnosis Description 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

## 2021-08-14 NOTE — Progress Notes (Addendum)
[name], Adriel M. ([id number]) Visit Report for 08/14/2021 Arrival Information Details Patient Name: Date of Service: [place], [place] [name] 08/14/2021 8:00 A M Medical Record Number: [id number] Patient Account Number: [id number] Date of Birth/Sex: Treating RN: Mar 16, 1970 (51 y.o. [name] Primary Care Javin Nong: [name] Other Clinician: [name] Referring Lenea Bywater: Treating Latesia Norrington/Extender: [name] in Treatment: 5 Visit Information History Since Last Visit All ordered tests and consults were completed: Yes Patient Arrived: Ambulatory Added or deleted any medications: No Arrival Time: 07:43 Any new allergies or adverse reactions: No Accompanied By: None Had a fall or experienced change in No Transfer Assistance: None activities of daily living that may affect Patient Identification Verified: Yes risk of falls: Secondary Verification Process Completed: Yes Signs or symptoms of abuse/neglect since last visito No Patient Requires Transmission-Based Precautions: No Hospitalized since last visit: No Patient Has Alerts: Yes Implantable device outside of the clinic excluding No Patient Alerts: ABI R 1.17 cellular tissue based products placed in the center ABI L 1.29 since last visit: R TBI 1.03 Pain Present Now: No L TBI 0.77 Electronic Signature(s) Signed: 08/14/2021 8:52:10 AM By: [name] EMT Entered By: [name] on 08/14/2021 08:52:10 -------------------------------------------------------------------------------- Encounter Discharge Information Details Patient Name: Date of Service: [place], [name] 08/14/2021 8:00 A M Medical Record Number: [id number] Patient Account Number: [id number] Date of Birth/Sex: Treating RN: [birth year] (51 y.o. [name] Primary Care Labarron Durnin: [name] Other Clinician: [name] Referring [name] Seese: Treating Raihana Balderrama/Extender: [name]  in Treatment: 5 Encounter Discharge Information Items Discharge Condition: Stable Ambulatory Status: Ambulatory Discharge Destination: Home Transportation: Private Auto Accompanied By: None Schedule Follow-up Appointment: Yes Clinical Summary of Care: Electronic Signature(s) Signed: 08/14/2021 11:02:13 AM By: [name] EMT Entered By: [name] on 08/14/2021 11:02:13 -------------------------------------------------------------------------------- Vitals Details Patient Name: Date of Service: [name], [name] 08/14/2021 8:00 A M Medical Record Number: [id number] Patient Account Number: [id number] Date of Birth/Sex: Treating RN: 02-15-1970 (51 y.o. [name] Primary Care Carlyon Nolasco: [name] Other Clinician: [name] Referring Fredie Majano: Treating Nethra Mehlberg/Extender: [name] in Treatment: 5 Vital Signs Time Taken: 07:57 Temperature (F): 97.9 Height (in): 72 Pulse (bpm): 87 Weight (lbs): 312 Respiratory Rate (breaths/min): 16 Body Mass Index (BMI): 42.3 Blood Pressure (mmHg): 132/85 Capillary Blood Glucose (mg/dl): 150 Reference Range: 80 - 120 mg / dl Electronic Signature(s) Signed: 08/14/2021 8:53:23 AM By: [name] EMT Entered By: [name] on 08/14/2021 08:53:23

## 2021-08-14 NOTE — Progress Notes (Addendum)
[name], [name] ([id number]) Visit Report for 08/14/2021 HBO Details Patient Name: Date of Service: [name], [name] 08/14/2021 8:00 A M Medical Record Number: [id number] Patient Account Number: [id number] Date of Birth/Sex: Treating RN: [birth year] [name] Primary Care Mcarthur Ivins: [name] Other Clinician: [name] Referring Desi Carby: Treating Tniya Bowditch/Extender: [name] in Treatment: 5 HBO Treatment Course Details Treatment Course Number: 1 Ordering Neetu Carrozza: [name] T Treatments Ordered: otal 40 HBO Treatment Start Date: 08/14/2021 HBO Indication: Diabetic Ulcer(s) of the Lower Extremity Standard/Conservative Wound Care tried and failed greater than or equal to 30 days Wound #2 Right, Lateral, Plantar Foot , Wound #3R Right, Lateral Foot HBO Treatment Details Treatment Number: 1 Patient Type: Outpatient Chamber Type: Monoplace Chamber Serial #: [id number] Treatment Protocol: 2.0 ATA with 90 minutes oxygen, and no air breaks Treatment Details Compression Rate Down: 1.0 psi / minute De-Compression Rate Up: 1.0 psi / minute Air breaks and breathing Decompress Decompress Compress Tx Pressure Begins Reached periods Begins Ends (leave unused spaces blank) Chamber Pressure (ATA 1 2 ------2 1 ) Clock Time (24 hr) 08:20 08:38 - - - - - - 10:08 10:23 Treatment Length: 123 (minutes) Treatment Segments: 4 Vital Signs Capillary Blood Glucose Reference Range: 80 - 120 mg / dl HBO Diabetic Blood Glucose Intervention Range: <131 mg/dl or >249 mg/dl Time Vitals Blood Respiratory Capillary Blood Glucose Pulse Action Type: Pulse: Temperature: Taken: Pressure: Rate: Glucose (mg/dl): Meter #: Oximetry (%) Taken: Pre 07:57 132/85 87 16 97.9 150 Post 10:30 130/76 67 18 97.9 181 Treatment Response Treatment Toleration: Well Treatment Completion Status: Treatment Completed without Adverse Event Treatment  Notes Today is the patient first treatment. He was seen by [name] before treatment started today. Compression rate set at 1 PSI/min. Ears check at 2,4,6,8,10,12 PSI/min and at 2ATA. The patient reported no problems at each stop. No problems noted for today's treatment. Additional Procedure Documentation Tissue Sevierity: Necrosis of bone Physician HBO Attestation: I certify that I supervised this HBO treatment in accordance with Medicare guidelines. A trained emergency response team is readily available per Yes hospital policies and procedures. Continue HBOT as ordered. Yes Electronic Signature(s) Signed: 08/14/2021 12:39:38 PM By: [name] MD FACS Previous Signature: 08/14/2021 11:03:50 AM Version By: [name] EMT Previous Signature: 08/14/2021 10:59:41 AM Version By: [name] EMT Previous Signature: 08/14/2021 9:02:22 AM Version By: [name] EMT Entered By: [name] on 08/14/2021 12:39:38 -------------------------------------------------------------------------------- HBO Safety Checklist Details Patient Name: Date of Service: [name], [name] 08/14/2021 8:00 A M Medical Record Number: [id number] Patient Account Number: [id number] Date of Birth/Sex: Treating RN: 08-Jun-1970 [name] Primary Care [name]: [name] Other Clinician: [name] Referring Pollie Poma: Treating Nijah Orlich/Extender: [name] in Treatment: 5 HBO Safety Checklist Items Safety Checklist Consent Form Signed Patient voided / foley secured and emptied When did you last eato 0715 Last dose of injectable or oral agent 0630 Ostomy pouch emptied and vented if applicable NA All implantable devices assessed, documented and approved PIC line Intravenous access site secured and place PIC Line Valuables secured Linens and cotton and cotton/polyester blend (less than 51% polyester) Personal oil-based products / skin lotions  / body lotions removed Wigs or hairpieces removed NA Smoking or tobacco materials removed Books / newspapers / magazines / loose paper removed Cologne, aftershave, perfume and deodorant removed Jewelry removed (may wrap wedding band) covered Make-up removed NA Hair care products removed Battery operated devices (  external) removed Heating patches and chemical warmers removed Titanium eyewear removed NA Nail polish cured greater than 10 hours NA Casting material cured greater than 10 hours NA Hearing aids removed NA Loose dentures or partials removed NA Prosthetics have been removed NA Patient demonstrates correct use of air break device (if applicable) Patient concerns have been addressed Patient grounding bracelet on and cord attached to chamber Specifics for Inpatients (complete in addition to above) Medication sheet sent with patient NA Intravenous medications needed or due during therapy sent with patient NA Drainage tubes (e.g. nasogastric tube or chest tube secured and vented) NA Endotracheal or Tracheotomy tube secured NA Cuff deflated of air and inflated with saline NA Airway suctioned NA Notes The safety checklist was done before treatment was started. Electronic Signature(s) Signed: 08/14/2021 8:55:10 AM By: [name] EMT Entered By: [name] on 08/14/2021 08:55:09

## 2021-08-14 NOTE — Progress Notes (Signed)
[name], [name] ([id number]) Visit Report for 08/14/2021 Problem List Details Patient Name: Date of Service: [name], [name] 08/14/2021 8:00 A M Medical Record Number: [id number] Patient Account Number: [id number] Date of Birth/Sex: Treating RN: 1970-01-26 (51 y.o. 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 08/14/2021 Medical Record Number: [id number] Patient Account Number: [id number] Date of Birth/Sex: Treating RN: [birth year] (51 y.o. 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 Facility Procedures CPT4 Code: [id number] Description: G0277-(Facility Use Only) HBOT full body chamber, 75mn , ICD-10 Diagnosis  Description L97.514 Non-pressure chronic ulcer of other part of right foot with necrosis of bone M86.671 Other chronic osteomyelitis, right ankle and foot E11.49 Type  2 diabetes mellitus with other diabetic neurological complication Modifier: Quantity: 4 Physician Procedures : CPT4 Code Description Modifier [id number] 25956- WC PHYS HYPERBARIC OXYGEN THERAPY ICD-10 Diagnosis Description L97.514 Non-pressure chronic ulcer of other part of right foot with necrosis of bone M86.671 Other chronic osteomyelitis, right ankle and foot  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

## 2021-08-15 ENCOUNTER — Encounter (HOSPITAL_BASED_OUTPATIENT_CLINIC_OR_DEPARTMENT_OTHER): Payer: BLUE CROSS/BLUE SHIELD | Admitting: General Surgery

## 2021-08-15 DIAGNOSIS — E11621 Type 2 diabetes mellitus with foot ulcer: Secondary | ICD-10-CM | POA: Diagnosis not present

## 2021-08-15 LAB — GLUCOSE, CAPILLARY
Glucose-Capillary: 130 mg/dL — ABNORMAL HIGH (ref 70–99)
Glucose-Capillary: 176 mg/dL — ABNORMAL HIGH (ref 70–99)

## 2021-08-15 NOTE — Progress Notes (Addendum)
[name], [name] ([id number]) Visit Report for 08/15/2021 HBO Details Patient Name: Date of Service: [name], [place] [name] 08/15/2021 8:00 A M Medical Record Number: [id number] Patient Account Number: [id number] Date of Birth/Sex: Treating RN: 1970-03-17 [name] Primary Care Cordarious Zeek: [name] Other Clinician: [name] Referring Kalia Vahey: Treating Wrigley Winborne/Extender: [name] in Treatment: 6 HBO Treatment Course Details Treatment Course Number: 1 Ordering Thane Age: [name] T Treatments Ordered: otal 40 HBO Treatment Start Date: 08/14/2021 HBO Indication: Diabetic Ulcer(s) of the Lower Extremity Standard/Conservative Wound Care tried and failed greater than or equal to 30 days Wound #2 Right, Lateral, Plantar Foot , Wound #3R Right, Lateral Foot HBO Treatment Details Treatment Number: 2 Patient Type: Outpatient Chamber Type: Monoplace Chamber Serial #: [id number] Treatment Protocol: 2.0 ATA with 90 minutes oxygen, and no air breaks Treatment Details Compression Rate Down: 1.0 psi / minute De-Compression Rate Up: Air breaks and breathing Decompress Decompress Compress Tx Pressure Begins Reached periods Begins Ends (leave unused spaces blank) Chamber Pressure (ATA 1 2 ------2 1 ) Clock Time (24 hr) 08:08 08:22 - - - - - - 09:52 10:07 Treatment Length: 119 (minutes) Treatment Segments: 4 Vital Signs Capillary Blood Glucose Reference Range: 80 - 120 mg / dl HBO Diabetic Blood Glucose Intervention Range: <131 mg/dl or >249 mg/dl Type: Time Vitals Blood Pulse: Respiratory Capillary Blood Glucose Pulse Action Temperature: Taken: Pressure: Rate: Glucose (mg/dl): Meter #: Oximetry (%) Taken: Pre 07:55 140/81 91 16 97.7 130 Patient given 8 oz glucerna Post 10:14 147/86 70 18 97.4 176 Treatment Response Treatment Toleration: Well Treatment Completion Status: Treatment Completed without Adverse  Event Treatment Notes Patient given 8 oz Glucerna. Spoke with [name] about blood sugar. She informed my to go ahead and start treatment, after the 8 oz Glucerna. The patient had no problems with compression today. No problems noted today. Additional Procedure Documentation Tissue Sevierity: Necrosis of bone Physician HBO Attestation: I certify that I supervised this HBO treatment in accordance with Medicare guidelines. A trained emergency response team is readily available per Yes hospital policies and procedures. Continue HBOT as ordered. Yes Electronic Signature(s) Signed: 08/15/2021 12:49:04 PM By: [name] MD FACS Previous Signature: 08/15/2021 10:33:12 AM Version By: [name] EMT Previous Signature: 08/15/2021 8:37:21 AM Version By: [name] EMT Previous Signature: 08/15/2021 8:33:52 AM Version By: [name] EMT Entered By: [name] on 08/15/2021 12:49:04 -------------------------------------------------------------------------------- HBO Safety Checklist Details Patient Name: Date of Service: [name], [name] 08/15/2021 8:00 A M Medical Record Number: [id number] Patient Account Number: [id number] Date of Birth/Sex: Treating RN: 04-11-1970 [name] Primary Care Zamoria Boss: [name] Other Clinician: [name] Referring Ellana Kawa: Treating Glennice Marcos/Extender: [name] in Treatment: 6 HBO Safety Checklist Items Safety Checklist Consent Form Signed Patient voided / foley secured and emptied When did you last eato 0710 Last dose of injectable or oral agent 0630 Ostomy pouch emptied and vented if applicable NA All implantable devices assessed, documented and approved PIC line Intravenous access site secured and place PIC Line Valuables secured Linens and cotton and cotton/polyester blend (less than 51% polyester) Personal oil-based products / skin lotions / body lotions  removed Wigs or hairpieces removed NA Smoking or tobacco materials removed Books / newspapers / magazines / loose paper removed Cologne, aftershave, perfume and deodorant removed Jewelry removed (may wrap wedding band) Make-up removed NA Hair care products removed Battery operated devices (external) removed Heating patches and chemical  warmers removed Titanium eyewear removed NA Nail polish cured greater than 10 hours NA Casting material cured greater than 10 hours NA Hearing aids removed NA Loose dentures or partials removed NA Prosthetics have been removed NA Patient demonstrates correct use of air break device (if applicable) Patient concerns have been addressed Patient grounding bracelet on and cord attached to chamber Specifics for Inpatients (complete in addition to above) Medication sheet sent with patient NA Intravenous medications needed or due during therapy sent with patient NA Drainage tubes (e.g. nasogastric tube or chest tube secured and vented) NA Endotracheal or Tracheotomy tube secured NA Cuff deflated of air and inflated with saline NA Airway suctioned NA Notes The safety checklist was done before treatment was started. Electronic Signature(s) Signed: 08/15/2021 8:28:14 AM By: [name] EMT Entered By: [name] on 08/15/2021 44:61:90

## 2021-08-15 NOTE — Progress Notes (Signed)
[name], [name] ([id number]) Visit Report for 08/15/2021 Problem List Details Patient Name: Date of Service: [name], [name] 08/15/2021 8:00 A M Medical Record Number: [id number] Patient Account Number: [id number] Date of Birth/Sex: Treating RN: [birth year] (51 y.o. 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 08/15/2021 Medical Record Number: [id number] Patient Account Number: [id number] Date of Birth/Sex: Treating RN: 14-Oct-1970 (51 y.o. 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 LO03.212Non-pressure chronic ulcer of other part of right foot with necrosis  of bone M86.671 Other chronic osteomyelitis, right ankle and foot Modifier: Quantity: 4 Physician Procedures : CPT4 Code Description Modifier [id number] 37048- WC PHYS HYPERBARIC OXYGEN THERAPY ICD-10 Diagnosis Description 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

## 2021-08-15 NOTE — Progress Notes (Addendum)
[name], [name] ([id number]) Visit Report for 08/15/2021 Arrival Information Details Patient Name: Date of Service: [place], [name] 08/15/2021 8:00 A M Medical Record Number: [id number] Patient Account Number: [id number] Date of Birth/Sex: Treating RN: [birth year] [name] Primary Care Angila Wombles: [name] Other Clinician: [name] Referring Niko Penson: Treating Curran Lenderman/Extender: [name] in Treatment: 6 Visit Information History Since Last Visit All ordered tests and consults were completed: Yes Patient Arrived: Ambulatory Added or deleted any medications: No Arrival Time: 07:37 Any new allergies or adverse reactions: No Accompanied By: None Had a fall or experienced change in No Transfer Assistance: None activities of daily living that may affect Patient Identification Verified: Yes risk of falls: Secondary Verification Process Completed: Yes Signs or symptoms of abuse/neglect since last visito No Patient Requires Transmission-Based Precautions: No Hospitalized since last visit: No Patient Has Alerts: Yes Implantable device outside of the clinic excluding No Patient Alerts: ABI R 1.17 cellular tissue based products placed in the center ABI L 1.29 since last visit: R TBI 1.03 Pain Present Now: No L TBI 0.77 Electronic Signature(s) Signed: 08/15/2021 8:25:46 AM By: [name] EMT Entered By: [name] on 08/15/2021 08:25:46 -------------------------------------------------------------------------------- Encounter Discharge Information Details Patient Name: Date of Service: [place], [name] 08/15/2021 8:00 A M Medical Record Number: [id number] Patient Account Number: [id number] Date of Birth/Sex: Treating RN: 05/27/1970 [name] Primary Care [name]: [name] Other Clinician: [name] Referring [name]: Treating Leverett Camplin/Extender: [name] in Treatment: 6 Encounter Discharge Information Items Discharge Condition: Stable Ambulatory Status: Ambulatory Discharge Destination: Home Transportation: Private Auto Accompanied By: None Schedule Follow-up Appointment: Yes Clinical Summary of Care: Electronic Signature(s) Signed: 08/15/2021 10:40:12 AM By: [name] EMT Entered By: [name] on 08/15/2021 10:40:12 -------------------------------------------------------------------------------- Vitals Details Patient Name: Date of Service: [name], [name] 08/15/2021 8:00 A M Medical Record Number: [id number] Patient Account Number: [id number] Date of Birth/Sex: Treating RN: 05-Sep-1970 [name] Primary Care Mariaclara Spear: [name] Other Clinician: [name] Referring [name]: Treating [name]/Extender: [name] in Treatment: 6 Vital Signs Time Taken: 07:55 Temperature (F): 97.7 Height (in): 72 Pulse (bpm): 91 Weight (lbs): 312 Respiratory Rate (breaths/min): 16 Body Mass Index (BMI): 42.3 Blood Pressure (mmHg): 140/81 Capillary Blood Glucose (mg/dl): 130 Reference Range: 80 - 120 mg / dl Electronic Signature(s) Signed: 08/15/2021 8:26:23 AM By: [name] EMT Entered By: [name] on 08/15/2021 41:58:30

## 2021-08-16 ENCOUNTER — Encounter (HOSPITAL_BASED_OUTPATIENT_CLINIC_OR_DEPARTMENT_OTHER): Payer: BLUE CROSS/BLUE SHIELD | Admitting: Internal Medicine

## 2021-08-16 DIAGNOSIS — E11621 Type 2 diabetes mellitus with foot ulcer: Secondary | ICD-10-CM | POA: Diagnosis not present

## 2021-08-16 DIAGNOSIS — E11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ical complication: Secondary | ICD-10-CM | POA: Diagnosis not present

## 2021-08-16 DIAGNOSIS — M86671 Other chronic osteomyelitis, right ankle and foot: Secondary | ICD-10-CM | POA: Diagnosis not present

## 2021-08-16 DIAGNOSIS — L97514 Non-pressure chronic ulcer of other part of right foot with necrosis of bone: Secondary | ICD-10-CM

## 2021-08-16 LAB — GLUCOSE, CAPILLARY
Glucose-Capillary: 158 mg/dL — ABNORMAL HIGH (ref 70–99)
Glucose-Capillary: 159 mg/dL — ABNORMAL HIGH (ref 70–99)

## 2021-08-16 NOTE — Progress Notes (Signed)
[name], Vondell M. ([id number]) Visit Report for 08/16/2021 HBO Details Patient Name: Date of Service: [name], [place] [name] 08/16/2021 8:00 A M Medical Record Number: [id number] Patient Account Number: [id number] Date of Birth/Sex: Treating RN: January 14, 1971 (51 y.o. [name] Primary Care Mahlik Lenn: [name] Other Clinician: [name] Referring Osmond Steckman: Treating Winter Trefz/Extender: [name] in Treatment: 6 HBO Treatment Course Details Treatment Course Number: 1 Ordering Otelia Hettinger: [name] T Treatments Ordered: otal 40 HBO Treatment Start Date: 08/14/2021 HBO Indication: Diabetic Ulcer(s) of the Lower Extremity Standard/Conservative Wound Care tried and failed greater than or equal to 30 days Wound #2 Right, Lateral, Plantar Foot , Wound #3R Right, Lateral Foot HBO Treatment Details Treatment Number: 3 Patient Type: Outpatient Chamber Type: Monoplace Chamber Serial #: [id number] Treatment Protocol: 2.0 ATA with 90 minutes oxygen, and no air breaks Treatment Details Compression Rate Down: 1.5 psi / minute De-Compression Rate Up: 1.5 psi / minute Air breaks and breathing Decompress Decompress Compress Tx Pressure Begins Reached periods Begins Ends (leave unused spaces blank) Chamber Pressure (ATA 1 2 ------2 1 ) Clock Time (24 hr) 08:13 08:24 - - - - - - 09:54 10:05 Treatment Length: 112 (minutes) Treatment Segments: 4 Vital Signs Capillary Blood Glucose Reference Range: 80 - 120 mg / dl HBO Diabetic Blood Glucose Intervention Range: <131 mg/dl or >249 mg/dl Time Vitals Blood Respiratory Capillary Blood Glucose Pulse Action Type: Pulse: Temperature: Taken: Pressure: Rate: Glucose (mg/dl): Meter #: Oximetry (%) Taken: Pre 08:07 140/81 89 16 98 158 Post 01:01 137/81 66 18 97.9 159 Treatment Response Treatment Toleration: Well Treatment Completion Status: Treatment Completed without Adverse Event Additional Procedure  Documentation Tissue Sevierity: Necrosis of bone Physician HBO Attestation: I certify that I supervised this HBO treatment in accordance with Medicare guidelines. A trained emergency response team is readily available per Yes hospital policies and procedures. Continue HBOT as ordered. Yes Electronic Signature(s) Signed: 08/19/2021 1:46:21 PM By: [name] DO Previous Signature: 08/16/2021 10:19:21 AM Version By: [name] EMT Previous Signature: 08/16/2021 8:47:00 AM Version By: [name] EMT Previous Signature: 08/16/2021 8:47:00 AM Version By: [name] EMT Entered By: [name] on 08/19/2021 13:45:05 -------------------------------------------------------------------------------- HBO Safety Checklist Details Patient Name: Date of Service: [name], [name] 08/16/2021 8:00 A M Medical Record Number: [id number] Patient Account Number: [id number] Date of Birth/Sex: Treating RN: [birth year] (51 y.o. [name] Primary Care Katherine Tout: [name] Other Clinician: [name] Referring Gao Mitnick: Treating Loreda Silverio/Extender: [name] in Treatment: 6 HBO Safety Checklist Items Safety Checklist Consent Form Signed Patient voided / foley secured and emptied When did you last eato 0715 Last dose of injectable or oral agent 0630 Ostomy pouch emptied and vented if applicable NA All implantable devices assessed, documented and approved PIC line Intravenous access site secured and place PIC Line Valuables secured Linens and cotton and cotton/polyester blend (less than 51% polyester) Personal oil-based products / skin lotions / body lotions removed Wigs or hairpieces removed NA Smoking or tobacco materials removed Books / newspapers / magazines / loose paper removed Cologne, aftershave, perfume and deodorant removed Jewelry removed (may wrap wedding band) NA Make-up removed NA Hair care products removed Battery  operated devices (external) removed Heating patches and chemical warmers removed Titanium eyewear removed NA Nail polish cured greater than 10 hours NA Casting material cured greater than 10 hours NA Hearing aids removed NA Loose dentures or partials removed NA Prosthetics have been removed NA Patient demonstrates correct use  of air break device (if applicable) Patient concerns have been addressed Patient grounding bracelet on and cord attached to chamber Specifics for Inpatients (complete in addition to above) Medication sheet sent with patient NA Intravenous medications needed or due during therapy sent with patient NA Drainage tubes (e.g. nasogastric tube or chest tube secured and vented) NA Endotracheal or Tracheotomy tube secured NA Cuff deflated of air and inflated with saline NA Airway suctioned NA Notes The safety checklist was done before treatment was started. Electronic Signature(s) Signed: 08/16/2021 8:46:17 AM By: [name] EMT Entered By: [name] on 08/16/2021 08:46:16

## 2021-08-16 NOTE — Progress Notes (Addendum)
[name], [name] ([id number]) Visit Report for 08/16/2021 Arrival Information Details Patient Name: Date of Service: [place], [place] [name] 08/16/2021 8:00 A M Medical Record Number: [id number] Patient Account Number: [id number] Date of Birth/Sex: Treating RN: 04-Sep-1970 [name] Primary Care Genae Strine: [name] Other Clinician: [name] Referring Jermesha Sottile: Treating Chaneka Trefz/Extender: [name] in Treatment: 6 Visit Information History Since Last Visit All ordered tests and consults were completed: Yes Patient Arrived: Ambulatory Added or deleted any medications: No Arrival Time: 07:32 Any new allergies or adverse reactions: No Accompanied By: None Had a fall or experienced change in No Transfer Assistance: None activities of daily living that may affect Patient Identification Verified: Yes risk of falls: Secondary Verification Process Completed: Yes Signs or symptoms of abuse/neglect since last visito No Patient Requires Transmission-Based Precautions: No Hospitalized since last visit: No Patient Has Alerts: Yes Implantable device outside of the clinic excluding No Patient Alerts: ABI R 1.17 cellular tissue based products placed in the center ABI L 1.29 since last visit: R TBI 1.03 Pain Present Now: No L TBI 0.77 Electronic Signature(s) Signed: 08/16/2021 8:44:33 AM By: [name] EMT Entered By: [name] on 08/16/2021 08:44:33 -------------------------------------------------------------------------------- Encounter Discharge Information Details Patient Name: Date of Service: [place], [name] 08/16/2021 8:00 A M Medical Record Number: [id number] Patient Account Number: [id number] Date of Birth/Sex: Treating RN: 08/07/1970 [name] Primary Care Tahjanae Blankenburg: [name] Other Clinician: [name] Referring Barbarita Hutmacher: Treating Dawood Spitler/Extender: [name]  in Treatment: 6 Encounter Discharge Information Items Discharge Condition: Stable Ambulatory Status: Ambulatory Discharge Destination: Home Transportation: Private Auto Accompanied By: None Schedule Follow-up Appointment: Yes Clinical Summary of Care: Electronic Signature(s) Signed: 08/16/2021 10:28:24 AM By: [name] EMT Entered By: [name] on 08/16/2021 10:28:23 -------------------------------------------------------------------------------- Vitals Details Patient Name: Date of Service: [name], [name] 08/16/2021 8:00 A M Medical Record Number: [id number] Patient Account Number: [id number] Date of Birth/Sex: Treating RN: July 09, 1970 [name] Primary Care Selena Swaminathan: [name] Other Clinician: [name] Referring Quaran Kedzierski: Treating Kash Davie/Extender: [name] in Treatment: 6 Vital Signs Time Taken: 08:07 Temperature (F): 98.0 Height (in): 72 Pulse (bpm): 89 Weight (lbs): 312 Respiratory Rate (breaths/min): 16 Body Mass Index (BMI): 42.3 Blood Pressure (mmHg): 140/81 Capillary Blood Glucose (mg/dl): 158 Reference Range: 80 - 120 mg / dl Electronic Signature(s) Signed: 08/16/2021 8:44:58 AM By: [name] EMT Entered By: [name] on 08/16/2021 08:44:58

## 2021-08-19 ENCOUNTER — Encounter (HOSPITAL_BASED_OUTPATIENT_CLINIC_OR_DEPARTMENT_OTHER): Payer: BLUE CROSS/BLUE SHIELD | Admitting: General Surgery

## 2021-08-19 DIAGNOSIS — E11621 Type 2 diabetes mellitus with foot ulcer: Secondary | ICD-10-CM | POA: Diagnosis not present

## 2021-08-19 LAB — GLUCOSE, CAPILLARY
Glucose-Capillary: 131 mg/dL — ABNORMAL HIGH (ref 70–99)
Glucose-Capillary: 171 mg/dL — ABNORMAL HIGH (ref 70–99)

## 2021-08-19 NOTE — Progress Notes (Signed)
[name], [name] ([id number]) Visit Report for 08/16/2021 Problem List Details Patient Name: Date of Service: [name], [name] 08/16/2021 8:00 A M Medical Record Number: [id number] Patient Account Number: [id number] Date of Birth/Sex: Treating RN: Dec 21, 1970 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 08/16/2021 Medical Record Number: [id number] Patient Account Number: [id number] Date of Birth/Sex: Treating RN: [birth year] 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 LW26.378Non-pressure chronic ulcer of other part of right foot with necrosis  of bone M86.671 Other chronic osteomyelitis, right ankle and foot Modifier: Quantity: 4 Physician Procedures : CPT4 Code Description Modifier [id number] PHYS HYPERBARIC OXYGEN THERAPY ICD-10 Diagnosis Description 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

## 2021-08-19 NOTE — Progress Notes (Addendum)
[name], [place]uma M. ([id number]) Visit Report for 08/19/2021 Arrival Information Details Patient Name: Date of Service: [place], [place] [name] 08/19/2021 8:00 A M Medical Record Number: [id number] Patient Account Number: [id number] Date of Birth/Sex: Treating RN: 09/28/1970 (51 y.o. [name] Primary Care Doreena Maulden: [name] Other Clinician: Donavan Burnet Referring Jearldine Cassady: Treating Sophya Vanblarcom/Extender: [name] in Treatment: 6 Visit Information History Since Last Visit All ordered tests and consults were completed: Yes Patient Arrived: Ambulatory Added or deleted any medications: No Arrival Time: 07:52 Any new allergies or adverse reactions: No Accompanied By: self Had a fall or experienced change in No Transfer Assistance: None activities of daily living that may affect Patient Identification Verified: Yes risk of falls: Secondary Verification Process Completed: Yes Signs or symptoms of abuse/neglect since last visito No Patient Requires Transmission-Based Precautions: No Hospitalized since last visit: No Patient Has Alerts: Yes Implantable device outside of the clinic excluding No Patient Alerts: ABI R 1.17 cellular tissue based products placed in the center ABI L 1.29 since last visit: R TBI 1.03 Pain Present Now: No L TBI 0.77 Electronic Signature(s) Signed: 08/19/2021 8:35:02 AM By: Donavan Burnet CHT EMT BS , , Entered By: Donavan Burnet on 08/19/2021 08:35:01 -------------------------------------------------------------------------------- Encounter Discharge Information Details Patient Name: Date of Service: [place], [name] 08/19/2021 8:00 A M Medical Record Number: [id number] Patient Account Number: [id number] Date of Birth/Sex: Treating RN: 1970-12-17 (51 y.o. [name] Primary Care Zaire Vanbuskirk: [name] Other Clinician: Donavan Burnet Referring Anndrea Mihelich: Treating Danikah Budzik/Extender: [name] in Treatment: 6 Encounter Discharge Information Items Discharge Condition: Stable Ambulatory Status: Ambulatory Discharge Destination: Other (Note Required) Transportation: Private Auto Accompanied By: staff Schedule Follow-up Appointment: No Clinical Summary of Care: Notes WCE after Treatment Electronic Signature(s) Signed: 08/19/2021 10:50:51 AM By: Donavan Burnet CHT EMT BS , , Entered By: Donavan Burnet on 08/19/2021 10:50:51 -------------------------------------------------------------------------------- Vitals Details Patient Name: Date of Service: [name], [name] 08/19/2021 8:00 A M Medical Record Number: [id number] Patient Account Number: [id number] Date of Birth/Sex: Treating RN: Dec 07, 1970 (51 y.o. [name] Primary Care Nava Song: [name] Other Clinician: Donavan Burnet Referring Nosson Wender: Treating Alonah Lineback/Extender: [name] in Treatment: 6 Vital Signs Time Taken: 08:08 Temperature (F): 98.9 Height (in): 72 Pulse (bpm): 76 Weight (lbs): 312 Respiratory Rate (breaths/min): 20 Body Mass Index (BMI): 42.3 Blood Pressure (mmHg): 136/80 Reference Range: 80 - 120 mg / dl Electronic Signature(s) Signed: 08/19/2021 8:43:14 AM By: Donavan Burnet CHT EMT BS , , Entered By: Donavan Burnet on 08/19/2021 08:43:13

## 2021-08-19 NOTE — Progress Notes (Signed)
[name], Talan M. ([id number]) Visit Report for 08/19/2021 Arrival Information Details Patient Name: Date of Service: [place],  [name] 08/19/2021 10:00 A M Medical Record Number: [id number] Patient Account Number: [id number] Date of Birth/Sex: Treating RN: 07/25/1970 (51 y.o. [name] Primary Care Alianny Toelle: [name] Other Clinician: Referring Charitie Hinote: Treating Bently Morath/Extender: [name] in Treatment: 6 Visit Information History Since Last Visit Added or deleted any medications: No Patient Arrived: Ambulatory Any new allergies or adverse reactions: No Arrival Time: 10:15 Had a fall or experienced change in No Accompanied By: self activities of daily living that may affect Transfer Assistance: None risk of falls: Patient Identification Verified: Yes Signs or symptoms of abuse/neglect since last No Secondary Verification Process Completed: Yes visito Patient Requires Transmission-Based Precautions: No Hospitalized since last visit: No Patient Has Alerts: Yes Implantable device outside of the clinic No Patient Alerts: ABI R 1.17 excluding ABI L 1.29 cellular tissue based products placed in the R TBI 1.03 center L TBI 0.77 since last visit: Has Dressing in Place as Prescribed: Yes Has Footwear/Offloading in Place as Yes Prescribed: Right: Removable Cast Walker/Walking Boot Pain Present Now: No Electronic Signature(s) Signed: 08/19/2021 4:54:44 PM By: [name] RN, BSN Entered By: [name] on 08/19/2021 10:16:28 -------------------------------------------------------------------------------- Encounter Discharge Information Details Patient Name: Date of Service: [place], [name] 08/19/2021 10:00 A M Medical Record Number: [id number] Patient Account Number: [id number] Date of Birth/Sex: Treating RN: Oct 30, 1970 (51 y.o. [name] Primary Care Shantera Monts: [name] Other Clinician: Referring  Nikos Anglemyer: Treating Suleman Gunning/Extender: [name] in Treatment: 6 Encounter Discharge Information Items Post Procedure Vitals Discharge Condition: Stable Temperature (F): 98 Ambulatory Status: Ambulatory Pulse (bpm): 73 Discharge Destination: Home Respiratory Rate (breaths/min): 18 Transportation: Private Auto Blood Pressure (mmHg): 147/91 Accompanied By: self Schedule Follow-up Appointment: Yes Clinical Summary of Care: Patient Declined Electronic Signature(s) Signed: 08/19/2021 4:54:44 PM By: [name] RN, BSN Entered By: [name] on 08/19/2021 10:49:10 -------------------------------------------------------------------------------- Lower Extremity Assessment Details Patient Name: Date of Service: [name], [name] 08/19/2021 10:00 A M Medical Record Number: [id number] Patient Account Number: [id number] Date of Birth/Sex: Treating RN: August 28, 1970 (51 y.o. [name] Primary Care Taimi Towe: [name] Other Clinician: Referring Ayra Hodgdon: Treating Kaari Zeigler/Extender: [name] in Treatment: 6 Edema Assessment Assessed: [Left: No] [Right: No] Edema: [Left: N] [Right: o] Calf Left: Right: Point of Measurement: From Medial Instep 40 cm Ankle Left: Right: Point of Measurement: From Medial Instep 24 cm Vascular Assessment Pulses: Dorsalis Pedis Palpable: [Right:Yes] Electronic Signature(s) Signed: 08/19/2021 4:54:44 PM By: [name] RN, BSN Entered By: [name] on 08/19/2021 10:19:59 -------------------------------------------------------------------------------- Multi Wound Chart Details Patient Name: Date of Service: [name], [name] 08/19/2021 10:00 A M Medical Record Number: [id number] Patient Account Number: [id number] Date of Birth/Sex: Treating RN: 1970/03/27 (51 y.o. [name] Primary Care Montae Stager: [name] Other Clinician: Referring  Shota Kohrs: Treating Roselee Tayloe/Extender: [name] in Treatment: 6 Vital Signs Height(in): 72 Capillary Blood Glucose(mg/dl): 171 Weight(lbs): 312 Pulse(bpm): 84 Body Mass Index(BMI): 42.3 Blood Pressure(mmHg): 147/91 Temperature(F): 98 Respiratory Rate(breaths/min): 18 Photos: [2:Right, Lateral, Plantar Foot] [3R:Right, Lateral Foot] [N/A:N/A N/A] Wound Location: [2:Gradually Appeared] [3R:Gradually Appeared] [N/A:N/A] Wounding Event: [2:Diabetic Wound/Ulcer of the Lower] [3R:Diabetic Wound/Ulcer of the Lower] [N/A:N/A] Primary Etiology: [2:Extremity Type II Diabetes] [3R:Extremity Type II Diabetes] [N/A:N/A] Comorbid History: [2:12/19/2016] [3R:05/24/2016] [N/A:N/A] Date Acquired: [2:6] [3R:5] [N/A:N/A] Weeks of Treatment: [2:Open] [3R:Open] [N/A:N/A] Wound Status: [2:No] [3R:Yes] [N/A:N/A]  Wound Recurrence: [2:0.3x2.1x2.6] [3R:0.1x0.1x0.1] [N/A:N/A] Measurements L x W x D (cm) [2:0.495] [3R:0.008] [N/A:N/A] A (cm) : rea [2:1.286] [3R:0.001] [N/A:N/A] Volume (cm) : [2:86.30%] [3R:93.20%] [N/A:N/A] % Reduction in A [2:rea: 92.90%] [3R:99.20%] [N/A:N/A] % Reduction in Volume: [2:3] Starting Position 1 (o'clock): [2:9] Ending Position 1 (o'clock): [2:0.5] Maximum Distance 1 (cm): [2:Yes] [3R:No] [N/A:N/A] Undermining: [2:Grade 3] [3R:Grade 2] [N/A:N/A] Classification: [2:Medium] [3R:None Present] [N/A:N/A] Exudate A mount: [2:Serosanguineous] [3R:N/A] [N/A:N/A] Exudate Type: [2:red, brown] [3R:N/A] [N/A:N/A] Exudate Color: [2:Thickened] [3R:Distinct, outline attached] [N/A:N/A] Wound Margin: [2:Medium (34-66%)] [3R:None Present (0%)] [N/A:N/A] Granulation A mount: [2:Red, Friable] [3R:N/A] [N/A:N/A] Granulation Quality: [2:Medium (34-66%)] [3R:None Present (0%)] [N/A:N/A] Necrotic A mount: [2:Fat Layer (Subcutaneous Tissue): Yes Fascia: No] [N/A:N/A] Exposed Structures: [2:Fascia: No Tendon: No Muscle: No Joint: No Bone: No Small (1-33%)] [3R:Fat  Layer (Subcutaneous Tissue): No Tendon: No Muscle: No Joint: No Bone: No Large (67-100%)] [N/A:N/A] Epithelialization: [2:Debridement - Excisional] [3R:N/A] [N/A:N/A] Debridement: Pre-procedure Verification/Time Out 10:30 [3R:N/A] [N/A:N/A] Taken: [2:Lidocaine 5% topical ointment] [3R:N/A] [N/A:N/A] Pain Control: [2:Callus, Subcutaneous] [3R:N/A] [N/A:N/A] Tissue Debrided: [2:Skin/Subcutaneous Tissue] [3R:N/A] [N/A:N/A] Level: [2:2.1] [3R:N/A] [N/A:N/A] Debridement A (sq cm): [2:rea Curette] [3R:N/A] [N/A:N/A] Instrument: [2:Minimum] [3R:N/A] [N/A:N/A] Bleeding: [2:Pressure] [3R:N/A] [N/A:N/A] Hemostasis A chieved: [2:0] [3R:N/A] [N/A:N/A] Procedural Pain: [2:0] [3R:N/A] [N/A:N/A] Post Procedural Pain: [2:Procedure was tolerated well] [3R:N/A] [N/A:N/A] Debridement Treatment Response: [2:0.5x2.1x2.6] [3R:N/A] [N/A:N/A] Post Debridement Measurements L x W x D (cm) [2:2.144] [3R:N/A] [N/A:N/A] Post Debridement Volume: (cm) [2:Debridement] [3R:N/A] [N/A:N/A] Treatment Notes Electronic Signature(s) Signed: 08/19/2021 10:36:38 AM By: [name] MD FACS Signed: 08/19/2021 4:54:44 PM By: [name] RN, BSN Entered By: [name] on 08/19/2021 10:36:38 -------------------------------------------------------------------------------- Multi-Disciplinary Care Plan Details Patient Name: Date of Service: [name], [name] 08/19/2021 10:00 A M Medical Record Number: [id number] Patient Account Number: [id number] Date of Birth/Sex: Treating RN: November 25, 1970 (51 y.o. [name] Primary Care Dagon Budai: [name] Other Clinician: Referring Dang Mathison: Treating Shatira Dobosz/Extender: [name] in Treatment: 6 Multidisciplinary Care Plan reviewed with physician Active Inactive Nutrition Nursing Diagnoses: Impaired glucose control: actual or potential Potential for alteratiion in Nutrition/Potential for imbalanced  nutrition Goals: Patient/caregiver will maintain therapeutic glucose control Date Initiated: 07/29/2021 Target Resolution Date: 08/26/2021 Goal Status: Active Interventions: Assess patient nutrition upon admission and as needed per policy Provide education on elevated blood sugars and impact on wound healing Treatment Activities: Patient referred to Primary Care Physician for further nutritional evaluation : 07/29/2021 Notes: Osteomyelitis Nursing Diagnoses: Infection: osteomyelitis Knowledge deficit related to disease process and management Goals: Patient/caregiver will verbalize understanding of disease process and disease management Date Initiated: 07/29/2021 Target Resolution Date: 08/26/2021 Goal Status: Active Patient's osteomyelitis will resolve Date Initiated: 07/29/2021 Target Resolution Date: 08/26/2021 Goal Status: Active Interventions: Assess for signs and symptoms of osteomyelitis resolution every visit Provide education on osteomyelitis Treatment Activities: Consult for HBO : 07/29/2021 Notes: Wound/Skin Impairment Nursing Diagnoses: Impaired tissue integrity Goals: Patient/caregiver will verbalize understanding of skin care regimen Date Initiated: 07/04/2021 Target Resolution Date: 09/10/2021 Goal Status: Active Interventions: Assess ulceration(s) every visit Treatment Activities: Skin care regimen initiated : 07/04/2021 Notes: Electronic Signature(s) Signed: 08/19/2021 4:54:44 PM By: [name] RN, BSN Entered By: [name] on 08/19/2021 10:27:51 -------------------------------------------------------------------------------- Pain Assessment Details Patient Name: Date of Service: [name] [name] 08/19/2021 10:00 A M Medical Record Number: [id number] Patient Account Number: [id number] Date of Birth/Sex: Treating RN: [birth year] (51 y.o. [name] Primary Care Corry Ihnen: [name] Other Clinician: Referring Danene Montijo: Treating  Argie Applegate/Extender: [name] in  Treatment: 6 Active Problems Location of Pain Severity and Description of Pain Patient Has Paino No Site Locations Rate the pain. Current Pain Level: 0 Character of Pain Describe the Pain: Tender Pain Management and Medication Current Pain Management: Electronic Signature(s) Signed: 08/19/2021 4:54:44 PM By: [name] RN, BSN Entered By: [name] on 08/19/2021 10:17:39 -------------------------------------------------------------------------------- Patient/Caregiver Education Details Patient Name: Date of Service: [place], [name] 7/31/2023andnbsp10:00 A M Medical Record Number: [id number] Patient Account Number: [id number] Date of Birth/Gender: Treating RN: January 10, 1971 (51 y.o. [name] Primary Care Physician: [name] Other Clinician: Referring Physician: Treating Physician/Extender: [name] in Treatment: 6 Education Assessment Education Provided To: Patient Education Topics Provided Elevated Blood Sugar/ Impact on Healing: Methods: Explain/Verbal Responses: Reinforcements needed, State content correctly Hyperbaric Oxygenation: Methods: Explain/Verbal Responses: Reinforcements needed, State content correctly Offloading: Methods: Explain/Verbal Responses: Reinforcements needed, State content correctly Wound/Skin Impairment: Methods: Explain/Verbal Responses: Reinforcements needed, State content correctly Electronic Signature(s) Signed: 08/19/2021 4:54:44 PM By: [name] RN, BSN Entered By: [name] on 08/19/2021 10:28:22 -------------------------------------------------------------------------------- Wound Assessment Details Patient Name: Date of Service: [place], [name] 08/19/2021 10:00 A M Medical Record Number: [id number] Patient Account Number: [id number] Date of Birth/Sex: Treating RN: Apr 30, 1970 (51 y.o. [name] Primary Care Damiya Sandefur: [name] Other Clinician: Referring Emauri Krygier: Treating Jinny Sweetland/Extender: [name] in Treatment: 6 Wound Status Wound Number: 2 Primary Etiology: Diabetic Wound/Ulcer of the Lower Extremity Wound Location: Right, Lateral, Plantar Foot Wound Status: Open Wounding Event: Gradually Appeared Comorbid History: Type II Diabetes Date Acquired: 12/19/2016 Weeks Of Treatment: 6 Clustered Wound: No Photos Wound Measurements Length: (cm) 0.3 Width: (cm) 2.1 Depth: (cm) 2.6 Area: (cm) 0.495 Volume: (cm) 1.286 % Reduction in Area: 86.3% % Reduction in Volume: 92.9% Epithelialization: Small (1-33%) Tunneling: No Undermining: Yes Starting Position (o'clock): 3 Ending Position (o'clock): 9 Maximum Distance: (cm) 0.5 Wound Description Classification: Grade 3 Wound Margin: Thickened Exudate Amount: Medium Exudate Type: Serosanguineous Exudate Color: red, brown Foul Odor After Cleansing: No Slough/Fibrino No Wound Bed Granulation Amount: Medium (34-66%) Exposed Structure Granulation Quality: Red, Friable Fascia Exposed: No Necrotic Amount: Medium (34-66%) Fat Layer (Subcutaneous Tissue) Exposed: Yes Necrotic Quality: Adherent Slough Tendon Exposed: No Muscle Exposed: No Joint Exposed: No Bone Exposed: No Treatment Notes Wound #2 (Foot) Wound Laterality: Plantar, Right, Lateral Cleanser Soap and Water Discharge Instruction: May shower and wash wound with dial antibacterial soap and water prior to dressing change. Wound Cleanser Discharge Instruction: Cleanse the wound with wound cleanser prior to applying a clean dressing using gauze sponges, not tissue or cotton balls. Peri-Wound Care cotton tipped applicators Topical Gentamicin Discharge Instruction: As directed by physician Primary Dressing KerraCel Ag Gelling Fiber Dressing, 4x5 in (silver alginate) Discharge Instruction: Apply silver alginate to  wound bed as instructed Secondary Dressing Secured With Paper Tape, 2x10 (in/yd) Discharge Instruction: Secure dressing with tape as directed. Compression Wrap Kerlix Roll 4.5x3.1 (in/yd) Discharge Instruction: Apply Kerlix and Coban compression as directed. Compression Stockings Add-Ons Electronic Signature(s) Signed: 08/19/2021 4:54:44 PM By: [name] RN, BSN Entered By: [name] on 08/19/2021 10:26:54 -------------------------------------------------------------------------------- Wound Assessment Details Patient Name: Date of Service: [name], [name] 08/19/2021 10:00 A M Medical Record Number: [id number] Patient Account Number: [id number] Date of Birth/Sex: Treating RN: Jun 12, 1970 (51 y.o. [name] Primary Care Adellyn Capek: [name] Other Clinician: Referring Caroll Weinheimer: Treating Kamiyah Kindel/Extender: [name] in Treatment: 6 Wound Status Wound Number: 3R Primary Etiology: Diabetic Wound/Ulcer  of the Lower Extremity Wound Location: Right, Lateral Foot Wound Status: Open Wounding Event: Gradually Appeared Comorbid History: Type II Diabetes Date Acquired: 05/24/2016 Weeks Of Treatment: 5 Clustered Wound: No Photos Wound Measurements Length: (cm) 0.1 Width: (cm) 0.1 Depth: (cm) 0.1 Area: (cm) 0.008 Volume: (cm) 0.001 % Reduction in Area: 93.2% % Reduction in Volume: 99.2% Epithelialization: Large (67-100%) Tunneling: No Undermining: No Wound Description Classification: Grade 2 Wound Margin: Distinct, outline attached Exudate Amount: None Present Foul Odor After Cleansing: No Slough/Fibrino No Wound Bed Granulation Amount: None Present (0%) Exposed Structure Necrotic Amount: None Present (0%) Fascia Exposed: No Fat Layer (Subcutaneous Tissue) Exposed: No Tendon Exposed: No Muscle Exposed: No Joint Exposed: No Bone Exposed: No Treatment Notes Wound #3R (Foot) Wound Laterality: Right,  Lateral Cleanser Peri-Wound Care Topical Primary Dressing KerraCel Ag Gelling Fiber Dressing, 2x2 in (silver alginate) Discharge Instruction: Apply silver alginate to wound bed as instructed Secondary Dressing Woven Gauze Sponge, Non-Sterile 4x4 in Discharge Instruction: Apply over primary dressing as directed. Secured With [employer], 4.5x3.1 (in/yd) Discharge Instruction: Secure with Kerlix as directed. Paper Tape, 2x10 (in/yd) Discharge Instruction: Secure dressing with tape as directed. Compression Wrap Compression Stockings Add-Ons Electronic Signature(s) Signed: 08/19/2021 4:54:44 PM By: [name] RN, BSN Entered By: [name] on 08/19/2021 10:26:25 -------------------------------------------------------------------------------- Vitals Details Patient Name: Date of Service: [name], [name] 08/19/2021 10:00 A M Medical Record Number: [id number] Patient Account Number: [id number] Date of Birth/Sex: Treating RN: 01-31-1970 (51 y.o. [name] Primary Care Tyjae Issa: [name] Other Clinician: Referring Nikko Goldwire: Treating Makenlee Mckeag/Extender: [name] in Treatment: 6 Vital Signs Time Taken: 10:15 Temperature (F): 98 Height (in): 72 Pulse (bpm): 73 Weight (lbs): 312 Respiratory Rate (breaths/min): 18 Body Mass Index (BMI): 42.3 Blood Pressure (mmHg): 147/91 Capillary Blood Glucose (mg/dl): 171 Reference Range: 80 - 120 mg / dl Notes glucose post HBO Electronic Signature(s) Signed: 08/19/2021 4:54:44 PM By: [name] RN, BSN Entered By: [name] on 08/19/2021 10:17:15

## 2021-08-19 NOTE — Progress Notes (Addendum)
[name], [name] ([id number]) Visit Report for 08/19/2021 HBO Details Patient Name: Date of Service: [name], [place] [name] 08/19/2021 8:00 A M Medical Record Number: [id number] Patient Account Number: [id number] Date of Birth/Sex: Treating RN: 07/24/1970 [name] Primary Care Aubrey Blackard: [name] Other Clinician: Donavan Burnet Referring Michel Eskelson: Treating Sayid Moll/Extender: [name] in Treatment: 6 HBO Treatment Course Details Treatment Course Number: 1 Ordering Hernando Reali: [name] T Treatments Ordered: otal 40 HBO Treatment Start Date: 08/14/2021 HBO Indication: Diabetic Ulcer(s) of the Lower Extremity Standard/Conservative Wound Care tried and failed greater than or equal to 30 days Wound #2 Right, Lateral, Plantar Foot , Wound #3R Right, Lateral Foot HBO Treatment Details Treatment Number: 4 Patient Type: Outpatient Chamber Type: Monoplace Chamber Serial #: [id number] Treatment Protocol: 2.0 ATA with 90 minutes oxygen, and no air breaks Treatment Details Compression Rate Down: 1.5 psi / minute De-Compression Rate Up: 1.5 psi / minute Air breaks and breathing Decompress Decompress Compress Tx Pressure Begins Reached periods Begins Ends (leave unused spaces blank) Chamber Pressure (ATA 1 2 ------2 1 ) Clock Time (24 hr) 08:12 08:21 - - - - - - 09:51 10:01 Treatment Length: 109 (minutes) Treatment Segments: 4 Vital Signs Capillary Blood Glucose Reference Range: 80 - 120 mg / dl HBO Diabetic Blood Glucose Intervention Range: <131 mg/dl or >249 mg/dl Type: Time Vitals Blood Respiratory Capillary Blood Glucose Pulse Action Pulse: Temperature: Taken: Pressure: Rate: Glucose (mg/dl): Meter #: Oximetry (%) Taken: Pre 08:08 136/80 76 20 98.9 131 1 none per protocol Post 10:07 147/91 73 18 98 171 1 none per protocol Treatment Response Treatment Toleration: Well Treatment Completion Status: Treatment Completed  without Adverse Event Additional Procedure Documentation Tissue Sevierity: Necrosis of bone Physician HBO Attestation: I certify that I supervised this HBO treatment in accordance with Medicare guidelines. A trained emergency response team is readily available per Yes hospital policies and procedures. Continue HBOT as ordered. Yes Electronic Signature(s) Signed: 08/19/2021 11:12:34 AM By: [name] MD FACS Previous Signature: 08/19/2021 11:08:59 AM Version By: Donavan Burnet CHT EMT BS , , Previous Signature: 08/19/2021 10:49:24 AM Version By: Donavan Burnet CHT EMT BS , , Previous Signature: 08/19/2021 10:49:24 AM Version By: Donavan Burnet CHT EMT BS , , Entered By: [name] on 08/19/2021 11:12:33 -------------------------------------------------------------------------------- HBO Safety Checklist Details Patient Name: Date of Service: [name], [name] 08/19/2021 8:00 A M Medical Record Number: [id number] Patient Account Number: [id number] Date of Birth/Sex: Treating RN: 1970-03-23 [name] Primary Care Almendra Loria: [name] Other Clinician: Donavan Burnet Referring Tzvi Economou: Treating Perle Gibbon/Extender: [name] in Treatment: 6 HBO Safety Checklist Items Safety Checklist Consent Form Signed Patient voided / foley secured and emptied When did you last eato 0700 Last dose of injectable or oral agent 0655 Ostomy pouch emptied and vented if applicable NA All implantable devices assessed, documented and approved NA Intravenous access site secured and place NA Valuables secured Linens and cotton and cotton/polyester blend (less than 51% polyester) Personal oil-based products / skin lotions / body lotions removed Wigs or hairpieces removed NA Smoking or tobacco materials removed NA Books / newspapers / magazines / loose paper removed Cologne, aftershave, perfume and deodorant removed Jewelry  removed (may wrap wedding band) Make-up removed NA Hair care products removed Battery operated devices (external) removed Heating patches and chemical warmers removed Titanium eyewear removed NA Nail polish cured greater than 10 hours NA Casting material cured greater than 10 hours NA  Hearing aids removed NA Loose dentures or partials removed NA Prosthetics have been removed NA Patient demonstrates correct use of air break device (if applicable) Patient concerns have been addressed Patient grounding bracelet on and cord attached to chamber Specifics for Inpatients (complete in addition to above) Medication sheet sent with patient NA Intravenous medications needed or due during therapy sent with patient NA Drainage tubes (e.g. nasogastric tube or chest tube secured and vented) NA Endotracheal or Tracheotomy tube secured NA Cuff deflated of air and inflated with saline NA Airway suctioned NA Notes Paper version used prior to treatment. Electronic Signature(s) Signed: 08/19/2021 8:44:29 AM By: Donavan Burnet CHT EMT BS , , Entered By: Donavan Burnet on 08/19/2021 08:44:29

## 2021-08-19 NOTE — Progress Notes (Signed)
[name], Bryen M. ([id number]) Visit Report for 08/19/2021 SuperBill Details Patient Name: Date of Service: [place], [place] [name] 08/19/2021 Medical Record Number: [id number] Patient Account Number: [id number] Date of Birth/Sex: Treating RN: Feb 15, 1970 (51 y.o. 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 LF57.322Non-pressure chronic ulcer of other part of right foot with necrosis of bone M86.671 Other chronic osteomyelitis, right ankle and foot Physician Procedures Quantity CPT4 Code Description Modifier [id number] PHYS HYPERBARIC OXYGEN THERAPY 1 ICD-10 Diagnosis Description 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

## 2021-08-19 NOTE — Progress Notes (Signed)
[name], [name] ([id number]) Visit Report for 08/19/2021 Chief Complaint Document Details Patient Name: Date of Service: [name], [name] 08/19/2021 10:00 A M Medical Record Number: [id number] Patient Account Number: [id number] Date of Birth/Sex: Treating RN: 05/28/1970 [name] Primary Care Provider: Garret [name] Other Clinician: Referring Provider: Treating Provider/Extender: [name] in Treatment: 6 Information Obtained from: Patient Chief Complaint Patients presents for treatment of an open diabetic ulcer Electronic Signature(s) Signed: 08/19/2021 10:36:44 AM By: [name] Maudlin MD FACS Entered By: [name] on 08/19/2021 10:36:44 -------------------------------------------------------------------------------- Debridement Details Patient Name: Date of Service: [name] IN, [name] 08/19/2021 10:00 A M Medical Record Number: [id number] Patient Account Number: [id number] Date of Birth/Sex: Treating RN: 06-08-1970 [name] Primary Care Provider: Garret [name] Other Clinician: Referring Provider: Treating Provider/Extender: [name] in Treatment: 6 Debridement Performed for Assessment: Wound #2 Right,Lateral,Plantar Foot Performed By: Physician [name] Maudlin, MD Debridement Type: Debridement Severity of Tissue Pre Debridement: Necrosis of bone Level of Consciousness (Pre-procedure): Awake and Alert Pre-procedure Verification/Time Out Yes - 10:30 Taken: Start Time: 10:33 Pain Control: Lidocaine 5% topical ointment T Area Debrided (L x W): otal 1 (cm) x 2.1 (cm) = 2.1 (cm) Tissue and other material debrided: Viable, Non-Viable, Callus, Subcutaneous, Skin: Epidermis Level: Skin/Subcutaneous Tissue Debridement Description: Excisional Instrument: Curette Bleeding: Minimum Hemostasis Achieved: Pressure Procedural Pain: 0 Post Procedural Pain: 0 Response to Treatment:  Procedure was tolerated well Level of Consciousness (Post- Awake and Alert procedure): Post Debridement Measurements of Total Wound Length: (cm) 0.5 Width: (cm) 2.1 Depth: (cm) 2.6 Volume: (cm) 2.144 Character of Wound/Ulcer Post Debridement: Improved Severity of Tissue Post Debridement: Fat layer exposed Post Procedure Diagnosis Same as Pre-procedure Electronic Signature(s) Signed: 08/19/2021 11:12:09 AM By: [name] Maudlin MD FACS Signed: 08/19/2021 4:54:44 PM By: [name] RN, BSN Entered By: [name] on 08/19/2021 10:35:31 -------------------------------------------------------------------------------- HPI Details Patient Name: Date of Service: [name] IN, [name] 08/19/2021 10:00 A M Medical Record Number: [id number] Patient Account Number: [id number] Date of Birth/Sex: Treating RN: 06/09/1970 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 The most recent amputation was in August 2022. At that operation, antibiotic beads were placed in the wound. He has been managed by podiatry for his procedures and management of his wounds. He has been in a Water engineer. He is on oral doxycycline. They have been using Betadine wet to dry dressings along with Iodosorb. The patient states that when he thinks the wound is getting too dry, he applies topical Neosporin. At his last visit, on June 7 of this year, the podiatrist determined that he felt the wound was stalled and referred him to wound care for additional evaluation and management. An MRI has been ordered, but not yet scheduled or performed.  Pathology from his operation in August 2022 demonstrated findings consistent with chronic osteomyelitis. Today, there is a large irregular wound on the plantar surface of his right foot, at about the level of the fifth metatarsal head. This tracks through to a pinpoint opening on the dorsal lateral portion of his foot. The intake nurse reported purulent drainage. There is some malodor  from the wound. No frank necrosis identified. 07/11/2021: Today, the wounds do not connect. I attempted multiple times from various directions and the shared tunnel is no longer open. He has some slough accumulation on the dorsal part of his foot as well as slough and callus buildup on the plantar surface. His MRI is scheduled for June 29. No purulent drainage or malodor appreciated today. 07/19/2021: The lateral foot wound has closed and there is no tunnel connecting the plantar foot wound to that site. The plantar foot wound still probes quite deeply, however. There is some slough, eschar, and nonviable tissue accumulated in the wound bed. No malodorous drainage present. His MRI was performed yesterday and is consistent with fairly extensive osteomyelitis. 07/29/2021: The patient has an appointment with infectious disease on July 18 to treat his osteomyelitis. The plantar wound still probes quite deeply, approaching bone. The orifice has narrowed quite substantially, however, making it more difficult for him to pack. 08/05/2021: The tunnel connecting the lateral foot wound to the plantar foot wound has reopened. He sees infectious disease tomorrow to discuss long-term antibiotic treatment for osteomyelitis. There was a bit of murky drainage in the wound, but this was noted after he had had topical lidocaine applied so may have just been a blob of the anesthetic. No odor or frank purulent drainage. The wound probes deeply at the midfoot approaching bone. He does have MRI results confirming his diagnosis of osteomyelitis.  He has failed to progress with conventional treatment and I think his best chance for preservation of the foot is to initiate hyperbaric oxygen therapy. 08/13/2021: The tunnel connecting the 2 wounds has closed again. He has a PICC line and is getting IV daptomycin and cefepime. EKG and chest x-ray are within normal limits. He is scheduled to start hyperbaric oxygen therapy tomorrow. The wound in his midfoot probes deeply, approaching bone. The skin at the orifice continues to heaped up and threatens to close over despite the large cavity within. No erythema, induration, or purulent drainage. The wound on his lateral foot is fairly small and quite clean. 08/19/2021: The lateral foot wound has nearly closed. The wound in his midfoot does not probe quite as deeply today. The skin at the orifice continues to try to roll in and obscure the opening. No frankly necrotic tissue appreciated. He is tolerating hyperbaric oxygen therapy. Electronic Signature(s) Signed: 08/19/2021 10:38:43 AM By: [name] Maudlin MD FACS Entered By: [name] on 08/19/2021 10:38:42 -------------------------------------------------------------------------------- Physical Exam Details Patient Name: Date of Service: [name] IN, [name] 08/19/2021 10:00 A M Medical Record Number: [id number] Patient Account Number: [id number] Date of Birth/Sex: Treating RN: 29-Oct-1970 [name] Primary Care Provider: Garret [name] Other Clinician: Referring Provider: Treating Provider/Extender: [name] in Treatment: 6 Constitutional Slightly hypertensive. . . . No acute distress.Marland Kitchen Respiratory Normal work of breathing on room air.. Notes 08/19/2021: The lateral foot wound has nearly closed. The wound in his midfoot does not probe quite as deeply today. The skin at the orifice continues to try to roll in and obscure the opening. No frankly necrotic tissue appreciated. Electronic  Signature(s) Signed: 08/19/2021 10:39:17 AM By: [name] Maudlin MD FACS Entered By: [name] on 08/19/2021 10:39:17 -------------------------------------------------------------------------------- Physician Orders Details Patient Name: Date of Service: [name] IN, [name] 08/19/2021 10:00 A M Medical Record Number: [id number] Patient Account Number: [id number] Date of Birth/Sex: Treating RN: 05-Aug-1970 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 - Dr Celine Ahr Room 4 per [name] Return A Mon 8/7//23 @ 07:30 am before HBO Mon 8/14 @ 10:30 am after HBO Bathing/ Shower/ Hygiene Other Bathing/Shower/Hygiene Orders/Instructions: - Change dressing after bathing Off-Loading Other: - Try to not stand on your feet too much Hyperbaric Oxygen Therapy Evaluate for HBO Therapy Indication: - wagner grade 3 diabetic foot ulcer right foot 2.0 ATA for 90 Minutes without A Breaks ir Total Number of Treatments: - 40 One treatments per day (delivered Monday through Friday unless otherwise specified in Special Instructions below): Finger stick Blood Glucose Pre- and Post- HBOT Treatment. Follow Hyperbaric Oxygen Glycemia Protocol Afrin (Oxymetazoline HCL) 0.05% nasal spray - 1 spray in both nostrils daily as needed prior to HBO treatment for difficulty clearing ears Wound Treatment Wound #2 - Foot Wound Laterality: Plantar, Right, Lateral Cleanser: Soap and Water Every Other Day/15 Days Discharge Instructions: May  shower and wash wound with dial antibacterial soap and water prior to dressing change. Cleanser: Wound Cleanser (Generic) Every Other Day/15 Days Discharge Instructions: Cleanse the wound with wound cleanser prior to applying a clean dressing using gauze sponges, not tissue or cotton balls. Peri-Wound Care: cotton tipped applicators (Generic) Every Other Day/15 Days Topical: Gentamicin Every Other Day/15 Days Discharge Instructions: As directed by physician Prim Dressing: KerraCel Ag Gelling Fiber Dressing, 4x5 in (silver alginate) (Generic) Every Other Day/15 Days ary Discharge Instructions: Apply silver alginate to wound bed as instructed Secured With: Paper Tape, 2x10 (in/yd) (Generic) Every Other Day/15 Days Discharge Instructions: Secure dressing with tape as directed. Compression Wrap: Kerlix Roll 4.5x3.1 (in/yd) (Generic) Every Other Day/15 Days Discharge Instructions: Apply Kerlix and Coban compression as directed. Wound #3R - Foot Wound Laterality: Right, Lateral Prim Dressing: KerraCel Ag Gelling Fiber Dressing, 2x2 in (silver alginate) ary Discharge Instructions: Apply silver alginate to wound bed as instructed Secondary Dressing: Woven Gauze Sponge, Non-Sterile 4x4 in Discharge Instructions: Apply over primary dressing as directed. Secured With: [employer], 4.5x3.1 (in/yd) Discharge Instructions: Secure with Kerlix as directed. Secured With: Paper Tape, 2x10 (in/yd) Discharge Instructions: Secure dressing with tape as directed. Patient Medications llergies: No Known Allergies A Notifications Medication Indication Start End prior to debridement 08/19/2021 lidocaine DOSE topical 5 % ointment - ointment topical GLYCEMIA INTERVENTIONS PROTOCOL PRE-HBO GLYCEMIA INTERVENTIONS ACTION INTERVENTION Obtain pre-HBO capillary blood glucose (ensure 1 physician order is in chart). A. Notify HBO physician and await physician orders. 2 If result is 70 mg/dl or below: B. If  the result meets the hospital definition of a critical result, follow hospital policy. A. Give patient an 8 ounce Glucerna Shake, an 8 ounce Ensure, or 8 ounces of a Glucerna/Ensure equivalent dietary supplement*. B. Wait 30 minutes. If result is 71 mg/dl to 130 mg/dl: C. Retest patients capillary blood glucose (CBG). D. If result greater than or equal to 110 mg/dl, proceed with HBO. If result less than 110 mg/dl, notify HBO physician and consider holding HBO. If result is 131 mg/dl to 249 mg/dl: A. Proceed with HBO. A. Notify HBO physician and await physician orders. B. It is recommended to hold HBO and do If result is 250 mg/dl or greater: blood/urine ketone  testing. C. If the result meets the hospital definition of a critical result, follow hospital policy. POST-HBO GLYCEMIA INTERVENTIONS ACTION INTERVENTION Obtain post HBO capillary blood glucose (ensure 1 physician order is in chart). A. Notify HBO physician and await physician orders. 2 If result is 70 mg/dl or below: B. If the result meets the hospital definition of a critical result, follow hospital policy. A. Give patient an 8 ounce Glucerna Shake, an 8 ounce Ensure, or 8 ounces of a Glucerna/Ensure equivalent dietary supplement*. B. Wait 15 minutes for symptoms of If result is 71 mg/dl to 100 mg/dl: hypoglycemia (i.e. nervousness, anxiety, sweating, chills, clamminess, irritability, confusion, tachycardia or dizziness). C. If patient asymptomatic, discharge patient. If patient symptomatic, repeat capillary blood glucose (CBG) and notify HBO physician. If result is 101 mg/dl to 249 mg/dl: A. Discharge patient. A. Notify HBO physician and await physician orders. B. It is recommended to do blood/urine ketone If result is 250 mg/dl or greater: testing. C. If the result meets the hospital definition of a critical result, follow hospital policy. *Juice or candies are NOT equivalent products. If patient  refuses the Glucerna or Ensure, please consult the hospital dietitian for an appropriate substitute. Electronic Signature(s) Signed: 08/19/2021 11:12:09 AM By: [name] Maudlin MD FACS Signed: 08/19/2021 4:54:44 PM By: [name] RN, BSN Previous Signature: 08/19/2021 10:39:24 AM Version By: [name] Maudlin MD FACS Entered By: [name] on 08/19/2021 10:40:07 -------------------------------------------------------------------------------- Problem List Details Patient Name: Date of Service: [name] IN, [name] 08/19/2021 10:00 A M Medical Record Number: [id number] Patient Account Number: [id number] Date of Birth/Sex: Treating RN: 04-Aug-1970 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 08/19/2021 10:00 A M Medical Record Number: [id number] Patient Account Number: [id number] Date of Birth/Sex: Treating RN: 1970-02-15 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 The most recent amputation was in August 2022. At that operation, antibiotic beads were placed in the wound. He has been managed by podiatry for his procedures and management of his wounds. He has been in a Water engineer. He is on oral doxycycline. They have been using Betadine wet to dry dressings along with Iodosorb. The patient states that when he thinks the wound is getting too dry, he applies topical Neosporin. At his last visit, on June 7 of this year, the podiatrist determined that he felt the wound was stalled and referred him to wound care for additional evaluation and management. An MRI has been ordered, but not yet scheduled or performed. Pathology  from his operation in August 2022 demonstrated findings consistent with chronic osteomyelitis. Today, there is a large irregular wound on the plantar surface of his right foot, at about the level of the fifth metatarsal head. This tracks through to a pinpoint opening on the dorsal lateral portion of his foot. The intake nurse reported purulent drainage. There is some malodor from the wound. No frank necrosis identified. 07/11/2021: Today, the wounds do not connect. I attempted multiple times from various directions and the shared tunnel is no longer open. He has some slough accumulation on the dorsal part of his foot as well as slough and callus buildup on the plantar surface. His MRI is scheduled for June 29. No purulent drainage or malodor appreciated today. 07/19/2021: The lateral foot wound has closed and there is no tunnel connecting the plantar foot wound to that site. The plantar foot wound still probes quite deeply, however. There is  some slough, eschar, and nonviable tissue accumulated in the wound bed. No malodorous drainage present. His MRI was performed yesterday and is consistent with fairly extensive osteomyelitis. 07/29/2021: The patient has an appointment with infectious disease on July 18 to treat his osteomyelitis. The plantar wound still probes quite deeply, approaching bone. The orifice has narrowed quite substantially, however, making it more difficult for him to pack. 08/05/2021: The tunnel connecting the lateral foot wound to the plantar foot wound has reopened. He sees infectious disease tomorrow to discuss long-term antibiotic treatment for osteomyelitis. There was a bit of murky drainage in the wound, but this was noted after he had had topical lidocaine applied so may have just been a blob of the anesthetic. No odor or frank purulent drainage. The wound probes deeply at the midfoot approaching bone. He does have MRI results confirming his diagnosis of osteomyelitis. He has failed to progress with conventional treatment and I think his best chance for preservation of the foot is to initiate hyperbaric oxygen therapy. 08/13/2021: The tunnel connecting the 2 wounds has closed again. He has a PICC line and is getting IV daptomycin and cefepime. EKG and chest x-ray are within normal limits. He is scheduled to start hyperbaric oxygen therapy tomorrow. The wound in his midfoot probes deeply, approaching bone. The skin at the orifice continues to heaped up and threatens to close over despite the large cavity within. No erythema, induration, or purulent drainage. The wound on his lateral foot is fairly small and quite clean. 08/19/2021: The lateral foot wound has nearly closed. The wound in his midfoot does not probe quite as deeply today. The skin at the orifice continues to try to roll in and obscure the opening. No frankly necrotic tissue appreciated. He is tolerating hyperbaric oxygen therapy. Patient  History Information obtained from Patient. Social History Never smoker, Marital Status - Married, Alcohol Use - Never, Drug Use - No History, Caffeine Use - Daily - T coffee. ea; Medical History Endocrine Patient has history of Type II Diabetes Hospitalization/Surgery History - Amuptation of 3rd,4th and 5th toes of Right foot;Oral Surgery;Anal Fissure surgery; Cholecystectomy. Objective Constitutional Slightly hypertensive. No acute distress.. Vitals Time Taken: 10:15 AM, Height: 72 in, Weight: 312 lbs, BMI: 42.3, Temperature: 98 F, Pulse: 73 bpm, Respiratory Rate: 18 breaths/min, Blood Pressure: 147/91 mmHg, Capillary Blood Glucose: 171 mg/dl. General Notes: glucose post HBO Respiratory Normal work of breathing on room air.. General Notes: 08/19/2021: The lateral foot wound has nearly closed. The wound in his midfoot does not probe quite as deeply today. The skin at  the orifice continues to try to roll in and obscure the opening. No frankly necrotic tissue appreciated. Integumentary (Hair, Skin) Wound #2 status is Open. Original cause of wound was Gradually Appeared. The date acquired was: 12/19/2016. The wound has been in treatment 6 weeks. The wound is located on the [place]. The wound measures 0.3cm length x 2.1cm width x 2.6cm depth; 0.495cm^2 area and 1.286cm^3 volume. There is Fat Layer (Subcutaneous Tissue) exposed. There is no tunneling noted, however, there is undermining starting at 3:00 and ending at 9:00 with a maximum distance of 0.5cm. There is a medium amount of serosanguineous drainage noted. The wound margin is thickened. There is medium (34-66%) red, friable granulation within the wound bed. There is a medium (34-66%) amount of necrotic tissue within the wound bed including Adherent Slough. Wound #3R status is Open. Original cause of wound was Gradually Appeared. The date acquired was: 05/24/2016. The wound has been in treatment 5 weeks. The wound is  located on the Right,Lateral Foot. The wound measures 0.1cm length x 0.1cm width x 0.1cm depth; 0.008cm^2 area and 0.001cm^3 volume. There is no tunneling or undermining noted. There is a none present amount of drainage noted. The wound margin is distinct with the outline attached to the wound base. There is no granulation within the wound bed. There is no necrotic tissue within the wound bed. Assessment Active Problems ICD-10 Type 2 diabetes mellitus with other diabetic neurological complication Other chronic osteomyelitis, right ankle and foot Non-pressure chronic ulcer of other part of right foot with necrosis of bone Procedures Wound #2 Pre-procedure diagnosis of Wound #2 is a Diabetic Wound/Ulcer of the Lower Extremity located on the Right,Lateral,Plantar Foot .Severity of Tissue Pre Debridement is: Necrosis of bone. There was a Excisional Skin/Subcutaneous Tissue Debridement with a total area of 2.1 sq cm performed by [name] Maudlin, MD. With the following instrument(s): Curette to remove Viable and Non-Viable tissue/material. Material removed includes Callus, Subcutaneous Tissue, and Skin: Epidermis after achieving pain control using Lidocaine 5% topical ointment. No specimens were taken. A time out was conducted at 10:30, prior to the start of the procedure. A Minimum amount of bleeding was controlled with Pressure. The procedure was tolerated well with a pain level of 0 throughout and a pain level of 0 following the procedure. Post Debridement Measurements: 0.5cm length x 2.1cm width x 2.6cm depth; 2.144cm^3 volume. Character of Wound/Ulcer Post Debridement is improved. Severity of Tissue Post Debridement is: Fat layer exposed. Post procedure Diagnosis Wound #2: Same as Pre-Procedure Plan 08/19/2021: The lateral foot wound has nearly closed. The wound in his midfoot does not probe quite as deeply today. The skin at the orifice continues to try to roll in and obscure the opening. No  frankly necrotic tissue appreciated. I used a curette to debride the rolling skin at the midfoot wound orifice as well as some nonviable subcutaneous tissue. We will continue to pack the wound with gentamicin and silver alginate. He will continue to receive his IV daptomycin and cefepime. He will continue hyperbaric oxygen therapy. Follow-up in 1 week. Electronic Signature(s) Signed: 08/19/2021 10:42:17 AM By: [name] Maudlin MD FACS Entered By: [name] on 08/19/2021 10:42:17 -------------------------------------------------------------------------------- HxROS Details Patient Name: Date of Service: [name] IN, [name] 08/19/2021 10:00 A M Medical Record Number: [id number] Patient Account Number: [id number] Date of Birth/Sex: Treating RN: May 12, 1970 [name] Primary Care Provider: Garret [name] Other Clinician: Referring Provider: Treating Provider/Extender: [name] in Treatment: 6  Information Obtained From Patient Endocrine Medical History: Positive for: Type II Diabetes Immunizations Pneumococcal Vaccine: Received Pneumococcal Vaccination: Yes Received Pneumococcal Vaccination On or After 60th Birthday: No Implantable Devices None Hospitalization / Surgery History Type of Hospitalization/Surgery Amuptation of 3rd,4th and 5th toes of Right foot;Oral Surgery;Anal Fissure surgery; Cholecystectomy Family and Social History Never smoker; Marital Status - Married; Alcohol Use: Never; Drug Use: No History; Caffeine Use: Daily - T coffee; Financial Concerns: No; Food, ea; Clothing or Shelter Needs: No; Support System Lacking: No; Transportation Concerns: No Electronic Signature(s) Signed: 08/19/2021 11:12:09 AM By: [name] Maudlin MD FACS Signed: 08/19/2021 4:54:44 PM By: [name] RN, BSN Entered By: [name] on 08/19/2021  10:38:48 -------------------------------------------------------------------------------- SuperBill Details Patient Name: Date of Service: [name] [place], [name] 08/19/2021 Medical Record Number: [id number] Patient Account Number: [id number] Date of Birth/Sex: Treating RN: Dec 11, 1970 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 W29.562 Other chronic osteomyelitis, right ankle and foot L97.514 Non-pressure chronic ulcer of other part of right foot with necrosis of bone Facility Procedures CPT4 Code: 13086578 Description: 46962 - DEB SUBQ TISSUE 20 SQ CM/< ICD-10 Diagnosis Description L97.514 Non-pressure chronic ulcer of other part of right foot with necrosis of bone Modifier: Quantity: 1 Physician Procedures : CPT4 Code Description Modifier [id number] 24401 - WC PHYS LEVEL 4 - EST PT 25 ICD-10 Diagnosis Description L97.514 Non-pressure chronic ulcer of other part of right foot with necrosis of bone M86.671 Other chronic osteomyelitis, right ankle and foot  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

## 2021-08-20 ENCOUNTER — Encounter (HOSPITAL_BASED_OUTPATIENT_CLINIC_OR_DEPARTMENT_OTHER): Payer: BLUE CROSS/BLUE SHIELD | Attending: General Surgery | Admitting: General Surgery

## 2021-08-20 DIAGNOSIS — E11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ical complication: Secondary | ICD-10-CM | POA: Insufficient documentation

## 2021-08-20 DIAGNOSIS — L97514 Non-pressure chronic ulcer of other part of right foot with necrosis of bone: Secondary | ICD-10-CM | POA: Insufficient documentation

## 2021-08-20 DIAGNOSIS — M86671 Other chronic osteomyelitis, right ankle and foot: Secondary | ICD-10-CM | POA: Diagnosis not present

## 2021-08-20 DIAGNOSIS — E1169 Type 2 diabetes mellitus with other specified complication: Secondary | ICD-10-CM | POA: Diagnosis not present

## 2021-08-20 LAB — GLUCOSE, CAPILLARY
Glucose-Capillary: 190 mg/dL — ABNORMAL HIGH (ref 70–99)
Glucose-Capillary: 234 mg/dL — ABNORMAL HIGH (ref 70–99)

## 2021-08-20 NOTE — Progress Notes (Addendum)
[name], Keigan M. ([id number]) Visit Report for 08/20/2021 HBO Details Patient Name: Date of Service: [name], [name] 08/20/2021 8:00 A M Medical Record Number: [id number] Patient Account Number: [id number] Date of Birth/Sex: Treating RN: Oct 30, 1970 (51 y.o. [name], [name] Primary Care Jeannelle Wiens: [name] Other Clinician: [name] Referring Elida Harbin: Treating Malaina Mortellaro/Extender: [name] in Treatment: 6 HBO Treatment Course Details Treatment Course Number: 1 Ordering Guerino Caporale: [name] T Treatments Ordered: otal 40 HBO Treatment Start Date: 08/14/2021 HBO Indication: Diabetic Ulcer(s) of the Lower Extremity Standard/Conservative Wound Care tried and failed greater than or equal to 30 days Wound #2 Right, Lateral, Plantar Foot , Wound #3R Right, Lateral Foot HBO Treatment Details Treatment Number: 5 Patient Type: Outpatient Chamber Type: Monoplace Chamber Serial #: [id number] Treatment Protocol: 2.0 ATA with 90 minutes oxygen, and no air breaks Treatment Details Compression Rate Down: 1.5 psi / minute De-Compression Rate Up: 1.5 psi / minute Air breaks and breathing Decompress Decompress Compress Tx Pressure Begins Reached periods Begins Ends (leave unused spaces blank) Chamber Pressure (ATA 1 2 ------2 1 ) Clock Time (24 hr) 08:02 08:11 - - - - - - 09:41 09:52 Treatment Length: 110 (minutes) Treatment Segments: 4 Vital Signs Capillary Blood Glucose Reference Range: 80 - 120 mg / dl HBO Diabetic Blood Glucose Intervention Range: <131 mg/dl or >249 mg/dl Time Vitals Blood Respiratory Capillary Blood Glucose Pulse Action Type: Pulse: Temperature: Taken: Pressure: Rate: Glucose (mg/dl): Meter #: Oximetry (%) Taken: Pre 07:56 141/88 89 16 97.9 190 Post 09:56 144/82 69 18 97.5 234 Treatment Response Treatment Toleration: Well Treatment Completion Status: Treatment Completed without Adverse Event Additional Procedure  Documentation Tissue Sevierity: Necrosis of bone Physician HBO Attestation: I certify that I supervised this HBO treatment in accordance with Medicare guidelines. A trained emergency response team is readily available per Yes hospital policies and procedures. Continue HBOT as ordered. Yes Electronic Signature(s) Signed: 08/20/2021 11:36:35 AM By: [name] MD FACS Previous Signature: 08/20/2021 10:59:51 AM Version By: [name] EMT Entered By: [name] on 08/20/2021 11:36:35 -------------------------------------------------------------------------------- HBO Safety Checklist Details Patient Name: Date of Service: [name], [name] 08/20/2021 8:00 A M Medical Record Number: [id number] Patient Account Number: [id number] Date of Birth/Sex: Treating RN: 06-12-1970 (51 y.o. [name], [name] Primary Care Sanskriti Greenlaw: [name] Other Clinician: [name] Referring Buel Molder: Treating Coral Timme/Extender: [name] in Treatment: 6 HBO Safety Checklist Items Safety Checklist Consent Form Signed Patient voided / foley secured and emptied When did you last eato 0645 Last dose of injectable or oral agent 0645 Ostomy pouch emptied and vented if applicable NA All implantable devices assessed, documented and approved PIC line Intravenous access site secured and place PIC Line Valuables secured Linens and cotton and cotton/polyester blend (less than 51% polyester) Personal oil-based products / skin lotions / body lotions removed Wigs or hairpieces removed NA Smoking or tobacco materials removed Books / newspapers / magazines / loose paper removed Cologne, aftershave, perfume and deodorant removed Jewelry removed (may wrap wedding band) NA Make-up removed NA Hair care products removed Battery operated devices (external) removed Heating patches and chemical warmers removed Titanium eyewear removed NA Nail polish cured greater than 10  hours NA Casting material cured greater than 10 hours NA Hearing aids removed NA Loose dentures or partials removed NA Prosthetics have been removed NA Patient demonstrates correct use of air break device (if applicable) Patient concerns have been addressed Patient grounding bracelet on and cord attached to  chamber Specifics for Inpatients (complete in addition to above) Medication sheet sent with patient NA Intravenous medications needed or due during therapy sent with patient NA Drainage tubes (e.g. nasogastric tube or chest tube secured and vented) NA Endotracheal or Tracheotomy tube secured NA Cuff deflated of air and inflated with saline NA Airway suctioned NA Notes The safety checklist was done before the treatment was started. Electronic Signature(s) Signed: 08/20/2021 10:54:11 AM By: [name] EMT Entered By: [name] on 08/20/2021 10:54:10

## 2021-08-20 NOTE — Progress Notes (Signed)
[name], [name] ([id number]) Visit Report for 08/20/2021 Problem List Details Patient Name: Date of Service: [name], [name] 08/20/2021 8:00 A M Medical Record Number: [id number] Patient Account Number: [id number] Date of Birth/Sex: Treating RN: [birth year] 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 08/20/2021 Medical Record Number: [id number] Patient Account Number: [id number] Date of Birth/Sex: Treating RN: 08/15/1970 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 LC48.889Non-pressure chronic ulcer of other part of right foot with necrosis  of bone M86.671 Other chronic osteomyelitis, right ankle and foot Modifier: Quantity: 4 Physician Procedures : CPT4 Code Description Modifier [id number] 88828- WC PHYS HYPERBARIC OXYGEN THERAPY ICD-10 Diagnosis Description 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

## 2021-08-20 NOTE — Progress Notes (Addendum)
[name], [name] ([id number]) Visit Report for 08/20/2021 Arrival Information Details Patient Name: Date of Service: [place], [place] [name] 08/20/2021 8:00 A M Medical Record Number: [id number] Patient Account Number: [id number] Date of Birth/Sex: Treating RN: May 18, 1970 [name], [name] Primary Care Marquasia Schmieder: [name] Other Clinician: [name] Referring Starla Deller: Treating Annaleia Pence/Extender: [name] in Treatment: 6 Visit Information History Since Last Visit All ordered tests and consults were completed: Yes Patient Arrived: Ambulatory Added or deleted any medications: No Arrival Time: 07:34 Any new allergies or adverse reactions: No Accompanied By: None Had a fall or experienced change in No Transfer Assistance: None activities of daily living that may affect Patient Identification Verified: Yes risk of falls: Secondary Verification Process Completed: Yes Signs or symptoms of abuse/neglect since last visito No Patient Requires Transmission-Based Precautions: No Hospitalized since last visit: No Patient Has Alerts: Yes Implantable device outside of the clinic excluding No Patient Alerts: ABI R 1.17 cellular tissue based products placed in the center ABI L 1.29 since last visit: R TBI 1.03 Pain Present Now: No L TBI 0.77 Electronic Signature(s) Signed: 08/20/2021 10:51:09 AM By: [name] EMT Entered By: [name] on 08/20/2021 10:51:09 -------------------------------------------------------------------------------- Vitals Details Patient Name: Date of Service: [name], [name] 08/20/2021 8:00 A M Medical Record Number: [id number] Patient Account Number: [id number] Date of Birth/Sex: Treating RN: 11-07-1970 [name], [name] Primary Care Alanea Woolridge: [name] Other Clinician: [name] Referring Brizeyda Holtmeyer: Treating Josie Burleigh/Extender: [name] in Treatment: 6 Vital  Signs Time Taken: 07:56 Temperature (F): 97.9 Height (in): 72 Pulse (bpm): 89 Weight (lbs): 312 Respiratory Rate (breaths/min): 16 Body Mass Index (BMI): 42.3 Blood Pressure (mmHg): 141/88 Capillary Blood Glucose (mg/dl): 190 Reference Range: 80 - 120 mg / dl Electronic Signature(s) Signed: 08/20/2021 10:51:35 AM By: [name] EMT Entered By: [name] on 08/20/2021 10:51:35

## 2021-08-21 ENCOUNTER — Encounter (HOSPITAL_BASED_OUTPATIENT_CLINIC_OR_DEPARTMENT_OTHER): Payer: BLUE CROSS/BLUE SHIELD | Admitting: Physician Assistant

## 2021-08-21 DIAGNOSIS — E11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ical complication: Secondary | ICD-10-CM | POA: Diagnosis not present

## 2021-08-21 LAB — GLUCOSE, CAPILLARY
Glucose-Capillary: 173 mg/dL — ABNORMAL HIGH (ref 70–99)
Glucose-Capillary: 217 mg/dL — ABNORMAL HIGH (ref 70–99)

## 2021-08-21 NOTE — Progress Notes (Addendum)
[name], [name] ([id number]) Visit Report for 08/21/2021 Arrival Information Details Patient Name: Date of Service: [place], [place] [name] 08/21/2021 8:00 A M Medical Record Number: [id number] Patient Account Number: [id number] Date of Birth/Sex: Treating RN: Mar 30, 1970 [name] Primary Care [name]: [name] Other Clinician: Valeria [name] Referring [name]: Treating [name]/Extender: [name] in Treatment: 6 Visit Information History Since Last Visit All ordered tests and consults were completed: Yes Patient Arrived: Ambulatory Added or deleted any medications: No Arrival Time: 07:41 Any new allergies or adverse reactions: No Accompanied By: None Had a fall or experienced change in No Transfer Assistance: None activities of daily living that may affect Patient Identification Verified: Yes risk of falls: Secondary Verification Process Completed: Yes Signs or symptoms of abuse/neglect since last visito No Patient Requires Transmission-Based Precautions: No Hospitalized since last visit: No Patient Has Alerts: Yes Implantable device outside of the clinic excluding No Patient Alerts: ABI R 1.17 cellular tissue based products placed in the center ABI L 1.29 since last visit: R TBI 1.03 Pain Present Now: No L TBI 0.77 Electronic Signature(s) Signed: 08/21/2021 8:43:59 AM By: [name] EMT Entered By: [name] on 08/21/2021 08:43:58 -------------------------------------------------------------------------------- Encounter Discharge Information Details Patient Name: Date of Service: [name], [name] 08/21/2021 8:00 A M Medical Record Number: [id number] Patient Account Number: [id number] Date of Birth/Sex: Treating RN: 1970-07-10 [name] Primary Care [name]: [name] Other Clinician: Valeria [name] Referring [name]: Treating [name]/Extender: [name]  in Treatment: 6 Encounter Discharge Information Items Discharge Condition: Stable Ambulatory Status: Ambulatory Discharge Destination: Home Transportation: Private Auto Accompanied By: None Schedule Follow-up Appointment: Yes Clinical Summary of Care: Electronic Signature(s) Signed: 08/21/2021 10:58:22 AM By: [name] EMT Entered By: [name] on 08/21/2021 10:58:22 -------------------------------------------------------------------------------- Vitals Details Patient Name: Date of Service: [name], [name] 08/21/2021 8:00 A M Medical Record Number: [id number] Patient Account Number: [id number] Date of Birth/Sex: Treating RN: 1970-07-18 [name] Primary Care [name]: [name] Other Clinician: Valeria [name] Referring [name]: Treating [name]/Extender: [name] in Treatment: 6 Vital Signs Time Taken: 08:00 Temperature (F): 97.9 Height (in): 72 Pulse (bpm): 88 Weight (lbs): 312 Respiratory Rate (breaths/min): 16 Body Mass Index (BMI): 42.3 Blood Pressure (mmHg): 142/84 Capillary Blood Glucose (mg/dl): 173 Reference Range: 80 - 120 mg / dl Electronic Signature(s) Signed: 08/21/2021 8:44:33 AM By: [name] EMT Entered By: [name] on 08/21/2021 08:44:33

## 2021-08-21 NOTE — Progress Notes (Addendum)
[name], [name] ([id number]) Visit Report for 08/21/2021 HBO Details Patient Name: Date of Service: [name], [name] 08/21/2021 8:00 A M Medical Record Number: [id number] Patient Account Number: [id number] Date of Birth/Sex: Treating RN: Feb 07, 1970 [name] Primary Care [name]: [name] Other Clinician: [name] Referring Oaklynn Stierwalt: Treating [name]/Extender: [name] in Treatment: 6 HBO Treatment Course Details Treatment Course Number: 1 Ordering [name]: [name] T Treatments Ordered: otal 40 HBO Treatment Start Date: 08/14/2021 HBO Indication: Diabetic Ulcer(s) of the Lower Extremity Standard/Conservative Wound Care tried and failed greater than or equal to 30 days Wound #2 Right, Lateral, Plantar Foot , Wound #3R Right, Lateral Foot HBO Treatment Details Treatment Number: 6 Patient Type: Outpatient Chamber Type: Monoplace Chamber Serial #: [id number] Treatment Protocol: 2.0 ATA with 90 minutes oxygen, and no air breaks Treatment Details Compression Rate Down: 1.5 psi / minute De-Compression Rate Up: 1.5 psi / minute Air breaks and breathing Decompress Decompress Compress Tx Pressure Begins Reached periods Begins Ends (leave unused spaces blank) Chamber Pressure (ATA 1 2 ------2 1 ) Clock Time (24 hr) 08:02 08:13 - - - - - - 09:43 09:53 Treatment Length: 111 (minutes) Treatment Segments: 4 Vital Signs Capillary Blood Glucose Reference Range: 80 - 120 mg / dl HBO Diabetic Blood Glucose Intervention Range: <131 mg/dl or >249 mg/dl Time Vitals Blood Respiratory Capillary Blood Glucose Pulse Action Type: Pulse: Temperature: Taken: Pressure: Rate: Glucose (mg/dl): Meter #: Oximetry (%) Taken: Pre 08:00 142/84 88 16 97.9 173 Post 09:58 141/86 67 16 97.3 217 Treatment Response Treatment Toleration: Well Treatment Completion Status: Treatment Completed without Adverse Event Additional  Procedure Documentation Tissue Sevierity: Necrosis of bone Electronic Signature(s) Signed: 08/21/2021 10:57:19 AM By: [name] EMT Signed: 08/21/2021 5:04:53 PM By: [name] PA-C Previous Signature: 08/21/2021 8:47:40 AM Version By: [name] EMT Entered By: [name] on 08/21/2021 10:57:18 -------------------------------------------------------------------------------- HBO Safety Checklist Details Patient Name: Date of Service: [name], [name] 08/21/2021 8:00 A M Medical Record Number: [id number] Patient Account Number: [id number] Date of Birth/Sex: Treating RN: 10-17-1970 [name] Primary Care [name]: [name] Other Clinician: [name] Referring [name]: Treating [name]/Extender: [name] in Treatment: 6 HBO Safety Checklist Items Safety Checklist Consent Form Signed Patient voided / foley secured and emptied When did you last eato 0715 Last dose of injectable or oral agent 0645 Ostomy pouch emptied and vented if applicable NA All implantable devices assessed, documented and approved PIC line Intravenous access site secured and place PIC Line Valuables secured Linens and cotton and cotton/polyester blend (less than 51% polyester) Personal oil-based products / skin lotions / body lotions removed Wigs or hairpieces removed NA Smoking or tobacco materials removed Books / newspapers / magazines / loose paper removed Cologne, aftershave, perfume and deodorant removed Jewelry removed (may wrap wedding band) NA Make-up removed NA Hair care products removed Battery operated devices (external) removed Heating patches and chemical warmers removed Titanium eyewear removed NA Nail polish cured greater than 10 hours NA Casting material cured greater than 10 hours NA Hearing aids removed NA Loose dentures or partials removed NA Prosthetics have been removed NA Patient demonstrates  correct use of air break device (if applicable) Patient concerns have been addressed Patient grounding bracelet on and cord attached to chamber Specifics for Inpatients (complete in addition to above) Medication sheet sent with patient NA Intravenous medications needed or due during therapy sent with patient  NA Drainage tubes (e.g. nasogastric tube or chest tube secured and vented) NA Endotracheal or Tracheotomy tube secured NA Cuff deflated of air and inflated with saline NA Airway suctioned NA Notes The safety checklist was done before the treatment was started. Electronic Signature(s) Signed: 08/21/2021 8:45:52 AM By: [name] EMT Entered By: [name] on 08/21/2021 08:45:52

## 2021-08-21 NOTE — Progress Notes (Signed)
[name], [name] ([id number]) Visit Report for 08/21/2021 Problem List Details Patient Name: Date of Service: [name], [name] 08/21/2021 8:00 A M Medical Record Number: [id number] Patient Account Number: [id number] Date of Birth/Sex: Treating RN: 1970/10/26 (51 y.o. 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 08/21/2021 Medical Record Number: [id number] Patient Account Number: [id number] Date of Birth/Sex: Treating RN: Oct 19, 1970 (51 y.o. 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 LL41.030Non-pressure chronic ulcer of other part of right foot with necrosis  of bone M86.671 Other chronic osteomyelitis, right ankle and foot Modifier: Quantity: 4 Physician Procedures : CPT4 Code Description Modifier [id number] 87579- WC PHYS HYPERBARIC OXYGEN THERAPY ICD-10 Diagnosis Description 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

## 2021-08-22 ENCOUNTER — Encounter (HOSPITAL_BASED_OUTPATIENT_CLINIC_OR_DEPARTMENT_OTHER): Payer: BLUE CROSS/BLUE SHIELD | Admitting: Internal Medicine

## 2021-08-22 DIAGNOSIS — E11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ical complication: Secondary | ICD-10-CM | POA: Diagnosis not present

## 2021-08-22 DIAGNOSIS — L97514 Non-pressure chronic ulcer of other part of right foot with necrosis of bone: Secondary | ICD-10-CM

## 2021-08-22 DIAGNOSIS — M86671 Other chronic osteomyelitis, right ankle and foot: Secondary | ICD-10-CM | POA: Diagnosis not present

## 2021-08-22 LAB — GLUCOSE, CAPILLARY
Glucose-Capillary: 146 mg/dL — ABNORMAL HIGH (ref 70–99)
Glucose-Capillary: 142 mg/dL — ABNORMAL HIGH (ref 70–99)

## 2021-08-22 NOTE — Progress Notes (Addendum)
[name], [name] ([id number]) Visit Report for 08/22/2021 HBO Details Patient Name: Date of Service: [name] [place], [place] [name] 08/22/2021 8:00 A M Medical Record Number: [id number] Patient Account Number: [id number] Date of Birth/Sex: Treating RN: [birth year] [name] Primary Care Shandricka Monroy: [name] Reddish Other Clinician: [name] Referring Shaakira Borrero: Treating Karlena Luebke/Extender: [name] in Treatment: 7 HBO Treatment Course Details Treatment Course Number: 1 Ordering Haizley Cannella: [name] T Treatments Ordered: otal 40 HBO Treatment Start Date: 08/14/2021 HBO Indication: Diabetic Ulcer(s) of the Lower Extremity Standard/Conservative Wound Care tried and failed greater than or equal to 30 days Wound #2 Right, Lateral, Plantar Foot , Wound #3R Right, Lateral Foot HBO Treatment Details Treatment Number: 7 Patient Type: Outpatient Chamber Type: Monoplace Chamber Serial #: [id number] Treatment Protocol: 2.0 ATA with 90 minutes oxygen, and no air breaks Treatment Details Compression Rate Down: 2.0 psi / minute De-Compression Rate Up: Air breaks and breathing Decompress Decompress Compress Tx Pressure Begins Reached periods Begins Ends (leave unused spaces blank) Chamber Pressure (ATA 1 2 ------2 1 ) Clock Time (24 hr) 08:32 08:41 - - - - - - 10:11 10:18 Treatment Length: 106 (minutes) Treatment Segments: 4 Vital Signs Capillary Blood Glucose Reference Range: 80 - 120 mg / dl HBO Diabetic Blood Glucose Intervention Range: <131 mg/dl or >249 mg/dl Time Vitals Blood Respiratory Capillary Blood Glucose Pulse Action Type: Pulse: Temperature: Taken: Pressure: Rate: Glucose (mg/dl): Meter #: Oximetry (%) Taken: Pre 08:25 135/83 86 16 98.2 146 Post 10:21 162/93 69 16 97.9 142 Treatment Response Treatment Toleration: Well Treatment Completion Status: Treatment Completed without Adverse Event Additional Procedure  Documentation Tissue Sevierity: Necrosis of bone Physician HBO Attestation: I certify that I supervised this HBO treatment in accordance with Medicare guidelines. A trained emergency response team is readily available per Yes hospital policies and procedures. Continue HBOT as ordered. Yes Electronic Signature(s) Signed: 08/22/2021 3:55:06 PM By: [name] DO Previous Signature: 08/22/2021 11:28:18 AM Version By: [name] EMT Previous Signature: 08/22/2021 11:27:57 AM Version By: [name] EMT Previous Signature: 08/22/2021 11:27:57 AM Version By: [name] EMT Previous Signature: 08/22/2021 9:08:19 AM Version By: [name] EMT Entered By: [name] on 08/22/2021 15:52:43 -------------------------------------------------------------------------------- HBO Safety Checklist Details Patient Name: Date of Service: [name] [place], [name] 08/22/2021 8:00 A M Medical Record Number: [id number] Patient Account Number: [id number] Date of Birth/Sex: Treating RN: [birth year] [name] Primary Care [name]: [name] Reddish Other Clinician: [name] Referring Madasyn Heath: Treating Omni Dunsworth/Extender: [name] in Treatment: 7 HBO Safety Checklist Items Safety Checklist Consent Form Signed Patient voided / foley secured and emptied When did you last eato 0745 Last dose of injectable or oral agent 0645 Ostomy pouch emptied and vented if applicable NA All implantable devices assessed, documented and approved PIC line Intravenous access site secured and place PIC Line Valuables secured Linens and cotton and cotton/polyester blend (less than 51% polyester) Personal oil-based products / skin lotions / body lotions removed Wigs or hairpieces removed NA Smoking or tobacco materials removed Books / newspapers / magazines / loose paper removed Cologne, aftershave, perfume and deodorant removed Jewelry removed (may wrap wedding  band) NA Make-up removed NA Hair care products removed Battery operated devices (external) removed Heating patches and chemical warmers removed Titanium eyewear removed NA Nail polish cured greater than 10 hours NA Casting material cured greater than 10 hours NA Hearing aids removed NA Loose dentures or partials removed NA Prosthetics have been  removed NA Patient demonstrates correct use of air break device (if applicable) Patient concerns have been addressed Patient grounding bracelet on and cord attached to chamber Specifics for Inpatients (complete in addition to above) Medication sheet sent with patient NA Intravenous medications needed or due during therapy sent with patient NA Drainage tubes (e.g. nasogastric tube or chest tube secured and vented) NA Endotracheal or Tracheotomy tube secured NA Cuff deflated of air and inflated with saline NA Airway suctioned NA Notes The safety checklist was done before the treatment was started. Electronic Signature(s) Signed: 08/22/2021 9:07:44 AM By: [name] EMT Entered By: [name] on 08/22/2021 09:07:44

## 2021-08-22 NOTE — Progress Notes (Signed)
[name], [name] ([id number]) Visit Report for 08/22/2021 Problem List Details Patient Name: Date of Service: [name], [name] 08/22/2021 8:00 A M Medical Record Number: [id number] Patient Account Number: [id number] Date of Birth/Sex: Treating RN: 16-May-1970 (51 y.o. 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 08/22/2021 Medical Record Number: [id number] Patient Account Number: [id number] Date of Birth/Sex: Treating RN: 03/03/1970 (51 y.o. 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 MP53.614Other chronic osteomyelitis, right ankle and foot L97.514  Non-pressure chronic ulcer of other part of right foot with necrosis of bone Modifier: Quantity: 4 Physician Procedures : CPT4 Code Description Modifier [id number] 86761- WC PHYS HYPERBARIC OXYGEN THERAPY ICD-10 Diagnosis Description 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

## 2021-08-22 NOTE — Progress Notes (Addendum)
[name], [name] ([id number]) Visit Report for 08/22/2021 Arrival Information Details Patient Name: Date of Service: [place], [place] [name] 08/22/2021 8:00 A M Medical Record Number: [id number] Patient Account Number: [id number] Date of Birth/Sex: Treating RN: 22-Apr-1970 [name] Primary Care Ashlynn Gunnels: [name] Other Clinician: [name] Referring Kemet Nijjar: Treating Adlynn Lowenstein/Extender: [name]: 7 Visit Information History Since Last Visit All ordered tests and consults were completed: Yes Patient Arrived: Ambulatory Added or deleted any medications: No Arrival Time: 07:51 Any new allergies or adverse reactions: No Accompanied By: None Had a fall or experienced change [name] No Transfer Assistance: None activities of daily living that may affect Patient Identification Verified: Yes risk of falls: Secondary Verification Process Completed: Yes Signs or symptoms of abuse/neglect since last visito No Patient Requires Transmission-Based Precautions: No Hospitalized since last visit: No Patient Has Alerts: Yes Implantable device outside of the clinic excluding No Patient Alerts: ABI R 1.17 cellular tissue based products placed [name] the center ABI L 1.29 since last visit: R TBI 1.03 Pain Present Now: No L TBI 0.77 Electronic Signature(s) Signed: 08/22/2021 9:05:50 AM By: [name] EMT Entered By: [name] on 08/22/2021 09:05:50 -------------------------------------------------------------------------------- Encounter Discharge Information Details Patient Name: Date of Service: [name], [name] [name] 08/22/2021 8:00 A M Medical Record Number: [id number] Patient Account Number: [id number] Date of Birth/Sex: Treating RN: [birth year] [name] Primary Care Laketra Bowdish: [name] Other Clinician: [name] Referring Alexiah Koroma: Treating Jasier Calabretta/Extender: [name]  Treatment: 7 Encounter Discharge Information Items Discharge Condition: Stable Ambulatory Status: Ambulatory Discharge Destination: Home Transportation: Private Auto Accompanied By: None Schedule Follow-up Appointment: Yes Clinical Summary of Care: Electronic Signature(s) Signed: 08/22/2021 11:29:17 AM By: [name] EMT Entered By: [name] on 08/22/2021 11:29:17 -------------------------------------------------------------------------------- Vitals Details Patient Name: Date of Service: [name], [name] [name] 08/22/2021 8:00 A M Medical Record Number: [id number] Patient Account Number: [id number] Date of Birth/Sex: Treating RN: 08-Oct-1970 [name] Primary Care Izreal Kock: [name] Other Clinician: [name] Referring Kodie Kishi: Treating [name]/Extender: [name]: 7 Vital Signs Time Taken: 08:25 Temperature (F): 98.2 Height ([name]: 72 Pulse (bpm): 86 Weight (lbs): 312 Respiratory Rate (breaths/min): 16 Body Mass Index (BMI): 42.3 Blood Pressure (mmHg): 135/83 Capillary Blood Glucose (mg/dl): 146 Reference Range: 80 - 120 mg / dl Electronic Signature(s) Signed: 08/22/2021 9:06:20 AM By: [name] EMT Entered By: [name] on 08/22/2021 09:06:19

## 2021-08-23 ENCOUNTER — Encounter (HOSPITAL_BASED_OUTPATIENT_CLINIC_OR_DEPARTMENT_OTHER): Payer: BLUE CROSS/BLUE SHIELD | Admitting: Internal Medicine

## 2021-08-23 DIAGNOSIS — E11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ical complication: Secondary | ICD-10-CM | POA: Diagnosis not present

## 2021-08-23 DIAGNOSIS — L97514 Non-pressure chronic ulcer of other part of right foot with necrosis of bone: Secondary | ICD-10-CM

## 2021-08-23 DIAGNOSIS — M86671 Other chronic osteomyelitis, right ankle and foot: Secondary | ICD-10-CM | POA: Diagnosis not present

## 2021-08-23 LAB — GLUCOSE, CAPILLARY
Glucose-Capillary: 133 mg/dL — ABNORMAL HIGH (ref 70–99)
Glucose-Capillary: 146 mg/dL — ABNORMAL HIGH (ref 70–99)

## 2021-08-23 NOTE — Progress Notes (Addendum)
[name], [name] ([id number]) Visit Report for 08/23/2021 HBO Details Patient Name: Date of Service: [name], [name] 08/23/2021 8:00 A M Medical Record Number: [id number] Patient Account Number: [id number] Date of Birth/Sex: Treating RN: Mar 17, 1970 [name], [name] Primary Care Layney Gillson: [name] Other Clinician: Donavan Burnet Referring Megann Easterwood: Treating Zanylah Hardie/Extender: [name] in Treatment: 7 HBO Treatment Course Details Treatment Course Number: 1 Ordering Larry Knipp: [name] T Treatments Ordered: otal 40 HBO Treatment Start Date: 08/14/2021 HBO Indication: Diabetic Ulcer(s) of the Lower Extremity Standard/Conservative Wound Care tried and failed greater than or equal to 30 days Wound #2 Right, Lateral, Plantar Foot , Wound #3R Right, Lateral Foot HBO Treatment Details Treatment Number: 8 Patient Type: Outpatient Chamber Type: Monoplace Chamber Serial #: [id number] Treatment Protocol: 2.0 ATA with 90 minutes oxygen, and no air breaks Treatment Details Compression Rate Down: 2.0 psi / minute De-Compression Rate Up: 2.0 psi / minute Air breaks and breathing Decompress Decompress Compress Tx Pressure Begins Reached periods Begins Ends (leave unused spaces blank) Chamber Pressure (ATA 1 2 ------2 1 ) Clock Time (24 hr) 08:24 08:32 - - - - - - 10:02 10:10 Treatment Length: 106 (minutes) Treatment Segments: 4 Vital Signs Capillary Blood Glucose Reference Range: 80 - 120 mg / dl HBO Diabetic Blood Glucose Intervention Range: <131 mg/dl or >249 mg/dl Type: Time Vitals Blood Respiratory Capillary Blood Glucose Pulse Action Pulse: Temperature: Taken: Pressure: Rate: Glucose (mg/dl): Meter #: Oximetry (%) Taken: Pre 07:51 140/81 82 16 98.6 133 1 none per protocol Post 10:14 139/79 70 16 97.2 146 1 none per protocol Treatment Response Treatment Toleration: Well Treatment Completion Status: Treatment Completed  without Adverse Event Additional Procedure Documentation Tissue Sevierity: Necrosis of bone Physician HBO Attestation: I certify that I supervised this HBO treatment in accordance with Medicare guidelines. A trained emergency response team is readily available per Yes hospital policies and procedures. Continue HBOT as ordered. Yes Electronic Signature(s) Signed: 08/23/2021 1:25:18 PM By: [name] DO Previous Signature: 08/23/2021 10:46:25 AM Version By: Donavan Burnet CHT EMT BS , , Entered By: [name] on 08/23/2021 13:23:47 -------------------------------------------------------------------------------- HBO Safety Checklist Details Patient Name: Date of Service: [name], [name] 08/23/2021 8:00 A M Medical Record Number: [id number] Patient Account Number: [id number] Date of Birth/Sex: Treating RN: 10/05/1970 [name], [name] Primary Care [name]: [name] Other Clinician: Donavan Burnet Referring Tru Rana: Treating Winslow Ederer/Extender: [name] in Treatment: 7 HBO Safety Checklist Items Safety Checklist Consent Form Signed Patient voided / foley secured and emptied When did you last eato 0810 Last dose of injectable or oral agent 0640 Ostomy pouch emptied and vented if applicable NA All implantable devices assessed, documented and approved NA Intravenous access site secured and place NA Valuables secured Linens and cotton and cotton/polyester blend (less than 51% polyester) Personal oil-based products / skin lotions / body lotions removed Wigs or hairpieces removed NA Smoking or tobacco materials removed NA Books / newspapers / magazines / loose paper removed Cologne, aftershave, perfume and deodorant removed Jewelry removed (may wrap wedding band) Make-up removed Hair care products removed Battery operated devices (external) removed Heating patches and chemical warmers removed Titanium eyewear  removed NA Nail polish cured greater than 10 hours NA Casting material cured greater than 10 hours NA Hearing aids removed NA Loose dentures or partials removed NA Prosthetics have been removed NA Patient demonstrates correct use of air break device (if applicable) Patient concerns have been addressed  Patient grounding bracelet on and cord attached to chamber Specifics for Inpatients (complete in addition to above) Medication sheet sent with patient NA Intravenous medications needed or due during therapy sent with patient NA Drainage tubes (e.g. nasogastric tube or chest tube secured and vented) NA Endotracheal or Tracheotomy tube secured NA Cuff deflated of air and inflated with saline NA Airway suctioned NA Notes Paper version used prior to treatment. Electronic Signature(s) Signed: 08/23/2021 9:50:07 AM By: Donavan Burnet CHT EMT BS , , Entered By: Donavan Burnet on 08/23/2021 09:50:07

## 2021-08-23 NOTE — Progress Notes (Signed)
[name], Brien M. ([id number]) Visit Report for 08/23/2021 SuperBill Details Patient Name: Date of Service: [place], [place] [name] 08/23/2021 Medical Record Number: [id number] Patient Account Number: [id number] Date of Birth/Sex: Treating RN: 06/20/1970 (51 y.o. 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 LW80.321Non-pressure chronic ulcer of other part of right foot with necrosis of bone M86.671 Other chronic osteomyelitis, right ankle and foot Physician Procedures Quantity CPT4 Code Description Modifier [id number] 03704- WC PHYS HYPERBARIC OXYGEN THERAPY 1 ICD-10 Diagnosis Description 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

## 2021-08-23 NOTE — Progress Notes (Addendum)
[name], [name] ([id number]) Visit Report for 08/23/2021 Arrival Information Details Patient Name: Date of Service: [name], [name] [place] [name] 08/23/2021 8:00 A M Medical Record Number: [id number] Patient Account Number: [id number] Date of Birth/Sex: Treating RN: Apr 16, 1970 [name], [name] Primary Care Shadeed Colberg: [name] Other Clinician: Donavan Burnet Referring Riyan Haile: Treating Samin Milke/Extender: [name] in Treatment: 7 Visit Information History Since Last Visit All ordered tests and consults were completed: Yes Patient Arrived: Ambulatory Added or deleted any medications: No Arrival Time: 07:47 Any new allergies or adverse reactions: No Accompanied By: self Had a fall or experienced change in No Transfer Assistance: None activities of daily living that may affect Patient Identification Verified: Yes risk of falls: Secondary Verification Process Completed: Yes Signs or symptoms of abuse/neglect since last visito No Patient Requires Transmission-Based Precautions: No Hospitalized since last visit: No Patient Has Alerts: Yes Implantable device outside of the clinic excluding No Patient Alerts: ABI R 1.17 cellular tissue based products placed in the center ABI L 1.29 since last visit: R TBI 1.03 Pain Present Now: No L TBI 0.77 Electronic Signature(s) Signed: 08/23/2021 9:48:13 AM By: Donavan Burnet CHT EMT BS , , Entered By: Donavan Burnet on 08/23/2021 09:48:13 -------------------------------------------------------------------------------- Encounter Discharge Information Details Patient Name: Date of Service: [name], [name] 08/23/2021 8:00 A M Medical Record Number: [id number] Patient Account Number: [id number] Date of Birth/Sex: Treating RN: [birth year] (52 y.o. [name] Primary Care Kallon Caylor: [name] Other Clinician: Donavan Burnet Referring Cheri Ayotte: Treating Zorianna Taliaferro/Extender: [name] in Treatment: 7 Encounter Discharge Information Items Discharge Condition: Stable Ambulatory Status: Ambulatory Discharge Destination: Home Transportation: Private Auto Accompanied By: self Schedule Follow-up Appointment: No Clinical Summary of Care: Electronic Signature(s) Signed: 08/23/2021 10:47:04 AM By: Donavan Burnet CHT EMT BS , , Entered By: Donavan Burnet on 08/23/2021 10:47:04 -------------------------------------------------------------------------------- Vitals Details Patient Name: Date of Service: [name], [name] 08/23/2021 8:00 A M Medical Record Number: [id number] Patient Account Number: [id number] Date of Birth/Sex: Treating RN: 1970/05/10 [name], [name] Primary Care Zebedee Segundo: [name] Other Clinician: Donavan Burnet Referring Aurore Redinger: Treating Seith Aikey/Extender: [name] in Treatment: 7 Vital Signs Time Taken: 07:51 Temperature (F): 98.6 Height (in): 72 Pulse (bpm): 82 Weight (lbs): 312 Respiratory Rate (breaths/min): 16 Body Mass Index (BMI): 42.3 Blood Pressure (mmHg): 140/81 Capillary Blood Glucose (mg/dl): 133 Reference Range: 80 - 120 mg / dl Electronic Signature(s) Signed: 08/23/2021 11:33:39 AM By: Donavan Burnet CHT EMT BS , , Previous Signature: 08/23/2021 9:48:41 AM Version By: Donavan Burnet CHT EMT BS , , Entered By: Donavan Burnet on 08/23/2021 11:33:39

## 2021-08-26 ENCOUNTER — Encounter (HOSPITAL_BASED_OUTPATIENT_CLINIC_OR_DEPARTMENT_OTHER): Payer: BLUE CROSS/BLUE SHIELD | Admitting: General Surgery

## 2021-08-26 ENCOUNTER — Encounter (HOSPITAL_BASED_OUTPATIENT_CLINIC_OR_DEPARTMENT_OTHER): Payer: BLUE CROSS/BLUE SHIELD | Admitting: Internal Medicine

## 2021-08-26 DIAGNOSIS — E11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ical complication: Secondary | ICD-10-CM | POA: Diagnosis not present

## 2021-08-26 LAB — GLUCOSE, CAPILLARY
Glucose-Capillary: 259 mg/dL — ABNORMAL HIGH (ref 70–99)
Glucose-Capillary: 216 mg/dL — ABNORMAL HIGH (ref 70–99)

## 2021-08-26 NOTE — Progress Notes (Signed)
[name], [name] ([id number]) Visit Report for 08/26/2021 Arrival Information Details Patient Name: Date of Service: [name], [name] 08/26/2021 7:30 A M Medical Record Number: [id number] Patient Account Number: [id number] Date of Birth/Sex: Treating RN: 09/22/1970 [name] Primary Care Burlin Mcnair: [name] Other Clinician: Referring Jeffrey Voth: Treating Marcell Chavarin/Extender: [name] in Treatment: 7 Visit Information History Since Last Visit Added or deleted any medications: No Patient Arrived: Ambulatory Any new allergies or adverse reactions: No Arrival Time: 07:45 Had a fall or experienced change in No Accompanied By: self activities of daily living that may affect Transfer Assistance: None risk of falls: Patient Identification Verified: Yes Signs or symptoms of abuse/neglect since last visito No Patient Requires Transmission-Based Precautions: No Hospitalized since last visit: No Patient Has Alerts: Yes Implantable device outside of the clinic excluding No Patient Alerts: ABI R 1.17 cellular tissue based products placed in the center ABI L 1.29 since last visit: R TBI 1.03 Has Dressing in Place as Prescribed: Yes L TBI 0.77 Pain Present Now: No Electronic Signature(s) Signed: 08/26/2021 5:39:10 PM By: Dellie Catholic RN Entered By: Dellie Catholic on 08/26/2021 07:45:40 -------------------------------------------------------------------------------- Encounter Discharge Information Details Patient Name: Date of Service: [place], [name] 08/26/2021 7:30 A M Medical Record Number: [id number] Patient Account Number: [id number] Date of Birth/Sex: Treating RN: 1970/06/23 [name] Primary Care Rogers Ditter: [name] Other Clinician: Referring Jazline Cumbee: Treating Prateek Knipple/Extender: [name] in Treatment: 7 Encounter Discharge Information Items Post Procedure Vitals Discharge  Condition: Stable Temperature (F): 98.9 Ambulatory Status: Ambulatory Pulse (bpm): 81 Discharge Destination: Home Respiratory Rate (breaths/min): 16 Transportation: Private Auto Blood Pressure (mmHg): 123/76 Accompanied By: self Schedule Follow-up Appointment: No Clinical Summary of Care: Patient Declined Electronic Signature(s) Signed: 08/26/2021 5:39:10 PM By: Dellie Catholic RN Entered By: Dellie Catholic on 08/26/2021 17:31:06 -------------------------------------------------------------------------------- Lower Extremity Assessment Details Patient Name: Date of Service: [name], [name] 08/26/2021 7:30 A M Medical Record Number: [id number] Patient Account Number: [id number] Date of Birth/Sex: Treating RN: April 29, 1970 [name] Primary Care Kolina Kube: [name] Other Clinician: Referring Maryruth Apple: Treating Karisa Nesser/Extender: [name] in Treatment: 7 Edema Assessment Assessed: [Left: No] [Right: No] Edema: [Left: N] [Right: o] Calf Left: Right: Point of Measurement: From Medial Instep 40 cm Ankle Left: Right: Point of Measurement: From Medial Instep 24 cm Electronic Signature(s) Signed: 08/26/2021 5:39:10 PM By: Dellie Catholic RN Entered By: Dellie Catholic on 08/26/2021 07:48:58 -------------------------------------------------------------------------------- Multi Wound Chart Details Patient Name: Date of Service: [name], [name] 08/26/2021 7:30 A M Medical Record Number: [id number] Patient Account Number: [id number] Date of Birth/Sex: Treating RN: 22-Jul-1970 [name] Primary Care Amanuel Sinkfield: [name] Other Clinician: Referring Journiee Feldkamp: Treating Gargi Berch/Extender: [name] in Treatment: 7 Vital Signs Height(in): 72 Pulse(bpm): 81 Weight(lbs): 312 Blood Pressure(mmHg): 123/76 Body Mass Index(BMI): 42.3 Temperature(F): 98.9 Respiratory  Rate(breaths/min): 16 Photos: [N/A:N/A] Right, Lateral, Plantar Foot Right, Lateral Foot N/A Wound Location: Gradually Appeared Gradually Appeared N/A Wounding Event: Diabetic Wound/Ulcer of the Lower Diabetic Wound/Ulcer of the Lower N/A Primary Etiology: Extremity Extremity Type II Diabetes Type II Diabetes N/A Comorbid History: 12/19/2016 05/24/2016 N/A Date Acquired: 7 6 N/A Weeks of Treatment: Open Healed - Epithelialized N/A Wound Status: No Yes N/A Wound Recurrence: 0.3x2x1.4 0x0x0 N/A Measurements L x W x D (cm) 0.471 0 N/A A (cm) : rea 0.66 0 N/A Volume (cm) : 87.00% 100.00% N/A %  Reduction in A rea: 96.30% 100.00% N/A % Reduction in Volume: 3 Starting Position 1 (o'clock): 9 Ending Position 1 (o'clock): 0.5 Maximum Distance 1 (cm): Yes No N/A Undermining: Grade 3 Grade 2 N/A Classification: Medium None Present N/A Exudate A mount: Serosanguineous N/A N/A Exudate Type: red, brown N/A N/A Exudate Color: Thickened Distinct, outline attached N/A Wound Margin: Small (1-33%) None Present (0%) N/A Granulation A mount: Red N/A N/A Granulation Quality: Large (67-100%) None Present (0%) N/A Necrotic A mount: Fat Layer (Subcutaneous Tissue): Yes Fascia: No N/A Exposed Structures: Fascia: No Fat Layer (Subcutaneous Tissue): No Tendon: No Tendon: No Muscle: No Muscle: No Joint: No Joint: No Bone: No Bone: No Small (1-33%) Large (67-100%) N/A Epithelialization: Debridement - Excisional N/A N/A Debridement: Pre-procedure Verification/Time Out 08:10 N/A N/A Taken: Lidocaine 5% topical ointment N/A N/A Pain Control: Subcutaneous, Slough N/A N/A Tissue Debrided: Skin/Subcutaneous Tissue N/A N/A Level: 0.6 N/A N/A Debridement A (sq cm): rea Curette N/A N/A Instrument: Minimum N/A N/A Bleeding: Pressure N/A N/A Hemostasis A chieved: 0 N/A N/A Procedural Pain: 0 N/A N/A Post Procedural Pain: Procedure was tolerated well N/A  N/A Debridement Treatment Response: 0.3x2x1.4 N/A N/A Post Debridement Measurements L x W x D (cm) 0.66 N/A N/A Post Debridement Volume: (cm) Debridement N/A N/A Procedures Performed: Treatment Notes Electronic Signature(s) Signed: 08/26/2021 8:22:14 AM By: [name] MD FACS Signed: 08/26/2021 5:07:27 PM By: [name] RN, BSN Entered By: [name] on 08/26/2021 08:22:14 -------------------------------------------------------------------------------- Multi-Disciplinary Care Plan Details Patient Name: Date of Service: [name], [name] 08/26/2021 7:30 A M Medical Record Number: [id number] Patient Account Number: [id number] Date of Birth/Sex: Treating RN: 09/07/1970 [name] Primary Care Tyjai Matuszak: [name] Other Clinician: Referring Gelene Recktenwald: Treating Kathaleen Dudziak/Extender: [name] in Treatment: 7 Multidisciplinary Care Plan reviewed with physician Active Inactive Nutrition Nursing Diagnoses: Impaired glucose control: actual or potential Potential for alteratiion in Nutrition/Potential for imbalanced nutrition Goals: Patient/caregiver will maintain therapeutic glucose control Date Initiated: 07/29/2021 Target Resolution Date: 11/16/2021 Goal Status: Active Interventions: Assess patient nutrition upon admission and as needed per policy Provide education on elevated blood sugars and impact on wound healing Treatment Activities: Patient referred to Primary Care Physician for further nutritional evaluation : 07/29/2021 Notes: Osteomyelitis Nursing Diagnoses: Infection: osteomyelitis Knowledge deficit related to disease process and management Goals: Patient/caregiver will verbalize understanding of disease process and disease management Date Initiated: 07/29/2021 Target Resolution Date: 11/16/2021 Goal Status: Active Patient's osteomyelitis will resolve Date Initiated: 07/29/2021 Target Resolution Date:  11/16/2021 Goal Status: Active Interventions: Assess for signs and symptoms of osteomyelitis resolution every visit Provide education on osteomyelitis Treatment Activities: Consult for HBO : 07/29/2021 Notes: Wound/Skin Impairment Nursing Diagnoses: Impaired tissue integrity Goals: Patient/caregiver will verbalize understanding of skin care regimen Date Initiated: 07/04/2021 Target Resolution Date: 11/16/2021 Goal Status: Active Interventions: Assess ulceration(s) every visit Treatment Activities: Skin care regimen initiated : 07/04/2021 Notes: Electronic Signature(s) Signed: 08/26/2021 5:39:10 PM By: Dellie Catholic RN Entered By: Dellie Catholic on 08/26/2021 17:29:28 -------------------------------------------------------------------------------- Pain Assessment Details Patient Name: Date of Service: [name], [name] 08/26/2021 7:30 A M Medical Record Number: [id number] Patient Account Number: [id number] Date of Birth/Sex: Treating RN: 06-Apr-1970 [name] Primary Care Taray Normoyle: [name] Other Clinician: Referring Yajayra Feldt: Treating Reagann Dolce/Extender: [name] in Treatment: 7 Active Problems Location of Pain Severity and Description of Pain Patient Has Paino No Site Locations Pain Management and Medication Current Pain Management: Electronic Signature(s) Signed: 08/26/2021 5:39:10  PM By: Dellie Catholic RN Entered By: Dellie Catholic on 08/26/2021 08:03:36 -------------------------------------------------------------------------------- Patient/Caregiver Education Details Patient Name: Date of Service: [name], [name] 8/7/2023andnbsp7:30 A M Medical Record Number: [id number] Patient Account Number: [id number] Date of Birth/Gender: Treating RN: [birth year] [name] Primary Care Physician: [name] Other Clinician: Referring Physician: Treating Physician/Extender: [name] in Treatment: 7 Education Assessment Education Provided To: Patient Education Topics Provided Wound/Skin Impairment: Methods: Explain/Verbal Responses: Return demonstration correctly Electronic Signature(s) Signed: 08/26/2021 5:39:10 PM By: Dellie Catholic RN Entered By: Dellie Catholic on 08/26/2021 17:29:47 -------------------------------------------------------------------------------- Wound Assessment Details Patient Name: Date of Service: [place], [name] 08/26/2021 7:30 A M Medical Record Number: [id number] Patient Account Number: [id number] Date of Birth/Sex: Treating RN: 04-23-1970 [name] Primary Care Maribel Luis: [name] Other Clinician: Referring Wei Newbrough: Treating Taliesin Hartlage/Extender: [name] in Treatment: 7 Wound Status Wound Number: 2 Primary Etiology: Diabetic Wound/Ulcer of the Lower Extremity Wound Location: Right, Lateral, Plantar Foot Wound Status: Open Wounding Event: Gradually Appeared Comorbid History: Type II Diabetes Date Acquired: 12/19/2016 Weeks Of Treatment: 7 Clustered Wound: No Photos Wound Measurements Length: (cm) 0.3 Width: (cm) 2 Depth: (cm) 1.4 Area: (cm) 0.471 Volume: (cm) 0.66 % Reduction in Area: 87% % Reduction in Volume: 96.3% Epithelialization: Small (1-33%) Undermining: Yes Starting Position (o'clock): 3 Ending Position (o'clock): 9 Maximum Distance: (cm) 0.5 Wound Description Classification: Grade 3 Wound Margin: Thickened Exudate Amount: Medium Exudate Type: Serosanguineous Exudate Color: red, brown Foul Odor After Cleansing: No Slough/Fibrino No Wound Bed Granulation Amount: Small (1-33%) Exposed Structure Granulation Quality: Red Fascia Exposed: No Necrotic Amount: Large (67-100%) Fat Layer (Subcutaneous Tissue) Exposed: Yes Necrotic Quality: Adherent Slough Tendon Exposed: No Muscle Exposed: No Joint Exposed: No Bone  Exposed: No Treatment Notes Wound #2 (Foot) Wound Laterality: Plantar, Right, Lateral Cleanser Soap and Water Discharge Instruction: May shower and wash wound with dial antibacterial soap and water prior to dressing change. Wound Cleanser Discharge Instruction: Cleanse the wound with wound cleanser prior to applying a clean dressing using gauze sponges, not tissue or cotton balls. Peri-Wound Care cotton tipped applicators Topical Gentamicin Discharge Instruction: As directed by physician Primary Dressing KerraCel Ag Gelling Fiber Dressing, 4x5 in (silver alginate) Discharge Instruction: Apply silver alginate to wound bed as instructed Secondary Dressing Secured With Paper Tape, 2x10 (in/yd) Discharge Instruction: Secure dressing with tape as directed. Compression Wrap Kerlix Roll 4.5x3.1 (in/yd) Discharge Instruction: Apply Kerlix and Coban compression as directed. Compression Stockings Add-Ons Electronic Signature(s) Signed: 08/26/2021 5:39:10 PM By: Dellie Catholic RN Entered By: Dellie Catholic on 08/26/2021 08:01:26 -------------------------------------------------------------------------------- Wound Assessment Details Patient Name: Date of Service: [name], [name] 08/26/2021 7:30 A M Medical Record Number: [id number] Patient Account Number: [id number] Date of Birth/Sex: Treating RN: [birth year] (52 y.o. [name] Primary Care Nasra Counce: [name] Other Clinician: Referring Melainie Krinsky: Treating Lenah Messenger/Extender: [name] in Treatment: 7 Wound Status Wound Number: 3R Primary Etiology: Diabetic Wound/Ulcer of the Lower Extremity Wound Location: Right, Lateral Foot Wound Status: Healed - Epithelialized Wounding Event: Gradually Appeared Comorbid History: Type II Diabetes Date Acquired: 05/24/2016 Weeks Of Treatment: 6 Clustered Wound: No Photos Wound Measurements Length: (cm) Width: (cm) Depth: (cm) Area: (cm) Volume:  (cm) 0 % Reduction in Area: 100% 0 % Reduction in Volume: 100% 0 Epithelialization: Large (67-100%) 0 Tunneling: No 0 Undermining: No Wound Description Classification: Grade 2 Wound Margin: Distinct, outline attached Exudate Amount: None Present Foul Odor  After Cleansing: No Slough/Fibrino No Wound Bed Granulation Amount: None Present (0%) Exposed Structure Necrotic Amount: None Present (0%) Fascia Exposed: No Fat Layer (Subcutaneous Tissue) Exposed: No Tendon Exposed: No Muscle Exposed: No Joint Exposed: No Bone Exposed: No Electronic Signature(s) Signed: 08/26/2021 5:39:10 PM By: Dellie Catholic RN Entered By: Dellie Catholic on 08/26/2021 08:13:20 -------------------------------------------------------------------------------- Vitals Details Patient Name: Date of Service: [name], [name] 08/26/2021 7:30 A M Medical Record Number: [id number] Patient Account Number: [id number] Date of Birth/Sex: Treating RN: 11/17/1970 [name] Primary Care [name]: [name] Other Clinician: Referring Sephora Boyar: Treating Carrolyn Hilmes/Extender: [name] in Treatment: 7 Vital Signs Time Taken: 07:44 Temperature (F): 98.9 Height (in): 72 Pulse (bpm): 81 Weight (lbs): 312 Respiratory Rate (breaths/min): 16 Body Mass Index (BMI): 42.3 Blood Pressure (mmHg): 123/76 Reference Range: 80 - 120 mg / dl Electronic Signature(s) Signed: 08/26/2021 5:39:10 PM By: Dellie Catholic RN Entered By: Dellie Catholic on 08/26/2021 07:59:46

## 2021-08-26 NOTE — Progress Notes (Addendum)
[name], [name] ([id number]) Visit Report for 08/26/2021 HBO Details Patient Name: Date of Service: [name], [name] 08/26/2021 8:30 A M Medical Record Number: [id number] Patient Account Number: [id number] Date of Birth/Sex: Treating RN: [birth year] [name] Primary Care Brysan Mcevoy: [name] Other Clinician: [name] Referring Alayjah Boehringer: Treating Shelie Lansing/Extender: [name] in Treatment: 7 HBO Treatment Course Details Treatment Course Number: 1 Ordering Jeroline Wolbert: [name] T Treatments Ordered: otal 40 HBO Treatment Start Date: 08/14/2021 HBO Indication: Diabetic Ulcer(s) of the Lower Extremity Standard/Conservative Wound Care tried and failed greater than or equal to 30 days Wound #2 Right, Lateral, Plantar Foot , Wound #3R Right, Lateral Foot HBO Treatment Details Treatment Number: 9 Patient Type: Outpatient Chamber Type: Monoplace Chamber Serial #: [id number] Treatment Protocol: 2.0 ATA with 90 minutes oxygen, and no air breaks Treatment Details Compression Rate Down: 2.0 psi / minute De-Compression Rate Up: 2.0 psi / minute Air breaks and breathing Decompress Decompress Compress Tx Pressure Begins Reached periods Begins Ends (leave unused spaces blank) Chamber Pressure (ATA 1 2 ------2 1 ) Clock Time (24 hr) 08:40 08:50 - - - - - - 10:20 10:27 Treatment Length: 107 (minutes) Treatment Segments: 4 Vital Signs Capillary Blood Glucose Reference Range: 80 - 120 mg / dl HBO Diabetic Blood Glucose Intervention Range: <131 mg/dl or >249 mg/dl Time Vitals Blood Respiratory Capillary Blood Glucose Pulse Action Type: Pulse: Temperature: Taken: Pressure: Rate: Glucose (mg/dl): Meter #: Oximetry (%) Taken: Pre 07:44 123/76 81 16 98.9 Pre 08:34 259 Post 10:33 152/88 66 18 97.2 216 Treatment Response Treatment Toleration: Well Treatment Completion Status: Treatment Completed without Adverse Event Additional  Procedure Documentation Tissue Sevierity: Necrosis of bone Physician HBO Attestation: I certify that I supervised this HBO treatment in accordance with Medicare guidelines. A trained emergency response team is readily available per Yes hospital policies and procedures. Continue HBOT as ordered. Yes Electronic Signature(s) Signed: 08/26/2021 4:50:49 PM By: [name] MD FACS Signed: 08/26/2021 4:50:49 PM By: [name] MD FACS Previous Signature: 08/26/2021 10:42:25 AM Version By: [name] EMT Previous Signature: 08/26/2021 9:18:42 AM Version By: [name] EMT Entered By: [name] on 08/26/2021 16:50:48 -------------------------------------------------------------------------------- HBO Safety Checklist Details Patient Name: Date of Service: [name], [name] 08/26/2021 8:30 A M Medical Record Number: [id number] Patient Account Number: [id number] Date of Birth/Sex: Treating RN: 1970/08/30 [name] Primary Care [name]: [name] Other Clinician: [name] Referring Paitynn Mikus: Treating Xiong Haidar/Extender: [name] in Treatment: 7 HBO Safety Checklist Items Safety Checklist Consent Form Signed Patient voided / foley secured and emptied When did you last eato 0650 Last dose of injectable or oral agent 0645 Ostomy pouch emptied and vented if applicable NA All implantable devices assessed, documented and approved NA Intravenous access site secured and place NA Valuables secured Linens and cotton and cotton/polyester blend (less than 51% polyester) Personal oil-based products / skin lotions / body lotions removed Wigs or hairpieces removed NA Smoking or tobacco materials removed Books / newspapers / magazines / loose paper removed Cologne, aftershave, perfume and deodorant removed Jewelry removed (may wrap wedding band) NA Make-up removed NA Hair care products removed Battery operated devices  (external) removed Heating patches and chemical warmers removed Titanium eyewear removed NA Nail polish cured greater than 10 hours NA Casting material cured greater than 10 hours NA Hearing aids removed NA Loose dentures or partials removed NA Prosthetics have been removed NA Patient demonstrates correct use  of air break device (if applicable) Patient concerns have been addressed Patient grounding bracelet on and cord attached to chamber Specifics for Inpatients (complete in addition to above) Medication sheet sent with patient NA Intravenous medications needed or due during therapy sent with patient NA Drainage tubes (e.g. nasogastric tube or chest tube secured and vented) NA Endotracheal or Tracheotomy tube secured NA Cuff deflated of air and inflated with saline NA Airway suctioned NA Notes The safety checklist was done before the treatment was started. Electronic Signature(s) Signed: 08/26/2021 9:17:20 AM By: [name] EMT Entered By: [name] on 08/26/2021 09:17:20

## 2021-08-26 NOTE — Progress Notes (Addendum)
[name], Dionne M. ([id number]) Visit Report for 08/26/2021 Arrival Information Details Patient Name: Date of Service: [place], [place] [name] 08/26/2021 8:30 A M Medical Record Number: [id number] Patient Account Number: [id number] Date of Birth/Sex: Treating RN: 05-21-1970 (51 y.o. [name] Primary Care Lambert Jeanty: [name] Other Clinician: [name] Referring Cordon Gassett: Treating Tijah Hane/Extender: [name] in Treatment: 7 Visit Information History Since Last Visit All ordered tests and consults were completed: Yes Patient Arrived: Ambulatory Added or deleted any medications: No Arrival Time: 07:39 Any new allergies or adverse reactions: No Accompanied By: None Had a fall or experienced change in No Transfer Assistance: None activities of daily living that may affect Patient Identification Verified: Yes risk of falls: Secondary Verification Process Completed: Yes Signs or symptoms of abuse/neglect since last visito No Patient Requires Transmission-Based Precautions: No Hospitalized since last visit: No Patient Has Alerts: Yes Implantable device outside of the clinic excluding No Patient Alerts: ABI R 1.17 cellular tissue based products placed in the center ABI L 1.29 since last visit: R TBI 1.03 Pain Present Now: No L TBI 0.77 Electronic Signature(s) Signed: 08/26/2021 8:51:03 AM By: [name] EMT Entered By: [name] on 08/26/2021 08:51:03 -------------------------------------------------------------------------------- Encounter Discharge Information Details Patient Name: Date of Service: [place], [name] 08/26/2021 8:30 A M Medical Record Number: [id number] Patient Account Number: [id number] Date of Birth/Sex: Treating RN: [birth year] (51 y.o. [name] Primary Care Jeffory Snelgrove: [name] Other Clinician: [name] Referring Shyia Fillingim: Treating Kolsen Choe/Extender: [name] in  Treatment: 7 Encounter Discharge Information Items Discharge Condition: Stable Ambulatory Status: Ambulatory Discharge Destination: Home Transportation: Private Auto Accompanied By: None Schedule Follow-up Appointment: Yes Clinical Summary of Care: Electronic Signature(s) Signed: 08/26/2021 10:43:31 AM By: [name] EMT Entered By: [name] on 08/26/2021 10:43:31 -------------------------------------------------------------------------------- Vitals Details Patient Name: Date of Service: [name], [name] 08/26/2021 8:30 A M Medical Record Number: [id number] Patient Account Number: [id number] Date of Birth/Sex: Treating RN: 1970/01/23 (51 y.o. [name] Primary Care Amabel Stmarie: [name] Other Clinician: [name] Referring Patriece Archbold: Treating Labrandon Knoch/Extender: [name] in Treatment: 7 Vital Signs Time Taken: 07:44 Temperature (F): 98.9 Height (in): 72 Pulse (bpm): 81 Weight (lbs): 312 Respiratory Rate (breaths/min): 16 Body Mass Index (BMI): 42.3 Blood Pressure (mmHg): 123/76 Reference Range: 80 - 120 mg / dl Electronic Signature(s) Signed: 08/26/2021 9:16:06 AM By: [name] EMT Entered By: [name] on 08/26/2021 09:16:05

## 2021-08-26 NOTE — Progress Notes (Signed)
[name], [name] ([id number]) Visit Report for 08/26/2021 Problem List Details Patient Name: Date of Service: [name], [place] [name] 08/26/2021 8:30 A M Medical Record Number: [id number] Patient Account Number: [id number] Date of Birth/Sex: Treating RN: [birth year] (51 y.o. 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 08/26/2021 Medical Record Number: [id number] Patient Account Number: [id number] Date of Birth/Sex: Treating RN: [birth year] (51 y.o. 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 MW38.882Other chronic osteomyelitis, right ankle and foot L97.514  Non-pressure chronic ulcer of other part of right foot with necrosis of bone Modifier: Quantity: 4 Physician Procedures : CPT4 Code Description Modifier [id number] PHYS HYPERBARIC OXYGEN THERAPY ICD-10 Diagnosis Description 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

## 2021-08-26 NOTE — Progress Notes (Signed)
[name], [name] ([id number]) Visit Report for 08/26/2021 Chief Complaint Document Details Patient Name: Date of Service: [place], [place] [name] 08/26/2021 7:30 A M Medical Record Number: [id number] Patient Account Number: [id number] Date of Birth/Sex: Treating RN: 08-17-1970 (51 y.o. [name] Primary Care Provider: [name] Other Clinician: Referring Provider: Treating Provider/Extender: [name] in Treatment: 7 Information Obtained from: Patient Chief Complaint Patients presents for treatment of an open diabetic ulcer Electronic Signature(s) Signed: 08/26/2021 8:22:19 AM By: [name] MD FACS Entered By: [name] on 08/26/2021 08:22:19 -------------------------------------------------------------------------------- Debridement Details Patient Name: Date of Service: [name], [name] 08/26/2021 7:30 A M Medical Record Number: [id number] Patient Account Number: [id number] Date of Birth/Sex: Treating RN: [birth year] (51 y.o. [name] Primary Care Provider: [name] Other Clinician: Referring Provider: Treating Provider/Extender: [name] in Treatment: 7 Debridement Performed for Assessment: Wound #2 Right,Lateral,Plantar Foot Performed By: Physician [name], MD Debridement Type: Debridement Severity of Tissue Pre Debridement: Fat layer exposed Level of Consciousness (Pre-procedure): Awake and Alert Pre-procedure Verification/Time Out Yes - 08:10 Taken: Start Time: 08:10 Pain Control: Lidocaine 5% topical ointment T Area Debrided (L x W): otal 0.3 (cm) x 2 (cm) = 0.6 (cm) Tissue and other material debrided: Non-Viable, Slough, Subcutaneous, Slough Level: Skin/Subcutaneous Tissue Debridement Description: Excisional Instrument: Curette Bleeding: Minimum Hemostasis Achieved: Pressure End Time: 08:12 Procedural Pain: 0 Post Procedural Pain: 0 Response to Treatment:  Procedure was tolerated well Level of Consciousness (Post- Awake and Alert procedure): Post Debridement Measurements of Total Wound Length: (cm) 0.3 Width: (cm) 2 Depth: (cm) 1.4 Volume: (cm) 0.66 Character of Wound/Ulcer Post Debridement: Improved Severity of Tissue Post Debridement: Fat layer exposed Post Procedure Diagnosis Same as Pre-procedure Electronic Signature(s) Signed: 08/26/2021 8:42:34 AM By: [name] MD FACS Signed: 08/26/2021 5:39:10 PM By: Dellie Catholic RN Entered By: Dellie Catholic on 08/26/2021 08:15:45 -------------------------------------------------------------------------------- HPI Details Patient Name: Date of Service: [name], [name] 08/26/2021 7:30 A M Medical Record Number: [id number] Patient Account Number: [id number] Date of Birth/Sex: Treating RN: August 22, 1970 (51 y.o. 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 The most recent amputation was in August 2022. At that operation, antibiotic beads were placed in the wound. He has been managed by podiatry for his procedures and management of his wounds. He has been in a Water engineer. He is on oral doxycycline. They have been using Betadine wet to dry dressings along with Iodosorb. The patient states that when he thinks the wound is getting too dry, he applies topical Neosporin. At his last visit, on June 7 of this year, the podiatrist determined that he felt the wound was stalled and referred him to wound care for additional evaluation and management. An MRI has been ordered, but not yet scheduled or performed. Pathology  from his operation in August 2022 demonstrated findings consistent with chronic osteomyelitis. Today, there is a large irregular wound on the plantar surface of his right foot, at about the level of the fifth metatarsal head. This tracks through to a pinpoint opening on the dorsal lateral portion of his foot. The intake nurse reported purulent drainage. There is some malodor  from the wound. No frank necrosis identified. 07/11/2021: Today, the wounds do not connect. I attempted multiple times from various directions and the shared tunnel is no longer open. He has some slough accumulation on the dorsal part of his foot as well as slough and callus buildup on the plantar surface. His MRI is scheduled for June 29. No purulent drainage or malodor appreciated today. 07/19/2021: The lateral foot wound has closed and there is no tunnel connecting the plantar foot wound to that site. The plantar foot wound still probes quite deeply, however. There is some slough, eschar, and nonviable tissue accumulated in the wound bed. No malodorous drainage present. His MRI was performed yesterday and is consistent with fairly extensive osteomyelitis. 07/29/2021: The patient has an appointment with infectious disease on July 18 to treat his osteomyelitis. The plantar wound still probes quite deeply, approaching bone. The orifice has narrowed quite substantially, however, making it more difficult for him to pack. 08/05/2021: The tunnel connecting the lateral foot wound to the plantar foot wound has reopened. He sees infectious disease tomorrow to discuss long-term antibiotic treatment for osteomyelitis. There was a bit of murky drainage in the wound, but this was noted after he had had topical lidocaine applied so may have just been a blob of the anesthetic. No odor or frank purulent drainage. The wound probes deeply at the midfoot approaching bone. He does have MRI results confirming his diagnosis of osteomyelitis. He has  failed to progress with conventional treatment and I think his best chance for preservation of the foot is to initiate hyperbaric oxygen therapy. 08/13/2021: The tunnel connecting the 2 wounds has closed again. He has a PICC line and is getting IV daptomycin and cefepime. EKG and chest x-ray are within normal limits. He is scheduled to start hyperbaric oxygen therapy tomorrow. The wound in his midfoot probes deeply, approaching bone. The skin at the orifice continues to heaped up and threatens to close over despite the large cavity within. No erythema, induration, or purulent drainage. The wound on his lateral foot is fairly small and quite clean. 08/19/2021: The lateral foot wound has nearly closed. The wound in his midfoot does not probe quite as deeply today. The skin at the orifice continues to try to roll in and obscure the opening. No frankly necrotic tissue appreciated. He is tolerating hyperbaric oxygen therapy. 08/26/2021: The lateral foot wound has closed completely. The wound in his midfoot is shallower again today. The wound orifice is contracting. We are using gentamicin and silver alginate. He continues on IV daptomycin and cefepime and is tolerating hyperbaric oxygen therapy without difficulty. Electronic Signature(s) Signed: 08/26/2021 8:23:11 AM By: [name] MD FACS Entered By: [name] on 08/26/2021 08:23:10 -------------------------------------------------------------------------------- Physical Exam Details Patient Name: Date of Service: [name], [name] 08/26/2021 7:30 A M Medical Record Number: [id number] Patient Account Number: [id number] Date of Birth/Sex: Treating RN: [birth year] (51 y.o. [name] Primary Care Provider: [name] Other Clinician: Referring Provider: Treating Provider/Extender: [name] in Treatment: 7 Constitutional . . . . No acute distress.Marland Kitchen Respiratory Normal work of breathing on room  air.. Notes 08/26/2021: The lateral foot wound has closed completely. The wound in his midfoot is shallower again today. The wound orifice is contracting. Electronic Signature(s) Signed: 08/26/2021 8:23:39 AM By: [name] MD FACS Entered By: [name] on 08/26/2021 08:23:38 -------------------------------------------------------------------------------- Physician Orders Details Patient Name: Date of Service: [place], [name] 08/26/2021 7:30 A M Medical Record  Number: [id number] Patient Account Number: [id number] Date of Birth/Sex: Treating RN: 16-Oct-1970 (51 y.o. 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 - Dr Celine Ahr Room 4 per [name] Return A Mon 8/7//23 @ 07:30 am before HBO Mon 8/14 @ 10:30 am after HBO Bathing/ Shower/ Hygiene Other Bathing/Shower/Hygiene Orders/Instructions: - Change dressing after bathing Off-Loading Other: - Try to not stand on your feet too much Hyperbaric Oxygen Therapy Evaluate for HBO Therapy Indication: - wagner grade 3 diabetic foot ulcer right foot 2.0 ATA for 90 Minutes without A Breaks ir Total Number of Treatments: - 40 One treatments per day (delivered Monday through Friday unless otherwise specified in Special Instructions below): Finger stick Blood Glucose Pre- and Post- HBOT Treatment. Follow Hyperbaric Oxygen Glycemia Protocol Afrin (Oxymetazoline HCL) 0.05% nasal spray - 1 spray in both nostrils daily as needed prior to HBO treatment for difficulty clearing  ears Wound Treatment Wound #2 - Foot Wound Laterality: Plantar, Right, Lateral Cleanser: Soap and Water Every Other Day/15 Days Discharge Instructions: May shower and wash wound with dial antibacterial soap and water prior to dressing change. Cleanser: Wound Cleanser (Generic) Every Other Day/15 Days Discharge Instructions: Cleanse the wound with wound cleanser prior to applying a clean dressing using gauze sponges, not tissue or cotton balls. Peri-Wound Care: cotton tipped applicators (Generic) Every Other Day/15 Days Topical: Gentamicin Every Other Day/15 Days Discharge Instructions: As directed by physician Prim Dressing: KerraCel Ag Gelling Fiber Dressing, 4x5 in (silver alginate) (Generic) Every Other Day/15 Days ary Discharge Instructions: Apply silver alginate to wound bed as instructed Secured With: Paper Tape, 2x10 (in/yd) (Generic) Every Other Day/15 Days Discharge Instructions: Secure dressing with tape as directed. Compression Wrap: Kerlix Roll 4.5x3.1 (in/yd) (Generic) Every Other Day/15 Days Discharge Instructions: Apply Kerlix and Coban compression as directed. Patient Medications llergies: No Known Allergies A Notifications Medication Indication Start End 08/26/2021 gentamicin DOSE topical 0.1 % ointment - Apply to wound with each dressing change GLYCEMIA INTERVENTIONS PROTOCOL PRE-HBO GLYCEMIA INTERVENTIONS ACTION INTERVENTION Obtain pre-HBO capillary blood glucose (ensure 1 physician order is in chart). A. Notify HBO physician and await physician orders. 2 If result is 70 mg/dl or below: B. If the result meets the hospital definition of a critical result, follow hospital policy. A. Give patient an 8 ounce Glucerna Shake, an 8 ounce Ensure, or 8 ounces of a Glucerna/Ensure equivalent dietary supplement*. B. Wait 30 minutes. If result is 71 mg/dl to 130 mg/dl: C. Retest patients capillary blood glucose (CBG). D. If result greater than or equal to 110  mg/dl, proceed with HBO. If result less than 110 mg/dl, notify HBO physician and consider holding HBO. If result is 131 mg/dl to 249 mg/dl: A. Proceed with HBO. A. Notify HBO physician and await physician orders. B. It is recommended to hold HBO and do If result is 250 mg/dl or greater: blood/urine ketone testing. C. If the result meets the hospital definition of a critical result, follow hospital policy. POST-HBO GLYCEMIA INTERVENTIONS ACTION INTERVENTION Obtain post HBO capillary blood glucose (ensure 1 physician order is in chart). A. Notify HBO physician and await physician orders. 2 If result is 70  mg/dl or below: B. If the result meets the hospital definition of a critical result, follow hospital policy. A. Give patient an 8 ounce Glucerna Shake, an 8 ounce Ensure, or 8 ounces of a Glucerna/Ensure equivalent dietary supplement*. B. Wait 15 minutes for symptoms of If result is 71 mg/dl to 100 mg/dl: hypoglycemia (i.e. nervousness, anxiety, sweating, chills, clamminess, irritability, confusion, tachycardia or dizziness). C. If patient asymptomatic, discharge patient. If patient symptomatic, repeat capillary blood glucose (CBG) and notify HBO physician. If result is 101 mg/dl to 249 mg/dl: A. Discharge patient. A. Notify HBO physician and await physician orders. B. It is recommended to do blood/urine ketone If result is 250 mg/dl or greater: testing. C. If the result meets the hospital definition of a critical result, follow hospital policy. *Juice or candies are NOT equivalent products. If patient refuses the Glucerna or Ensure, please consult the hospital dietitian for an appropriate substitute. Electronic Signature(s) Signed: 08/26/2021 8:24:52 AM By: [name] MD FACS Entered By: [name] on 08/26/2021 08:24:52 -------------------------------------------------------------------------------- Problem List Details Patient Name: Date of Service: [place]  [place], [name] 08/26/2021 7:30 A M Medical Record Number: [id number] Patient Account Number: [id number] Date of Birth/Sex: Treating RN: 1970-10-23 (51 y.o. 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 08/26/2021 7:30 A M Medical Record Number: [id number] Patient Account Number: [id number] Date of Birth/Sex: Treating RN: [birth year] (51 y.o. 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 The most recent amputation was in August 2022. At that operation, antibiotic beads were placed in the wound. He has been managed by podiatry for his procedures and management of his wounds. He has been in a Water engineer. He is  on oral doxycycline. They have been using Betadine wet to dry dressings along with Iodosorb. The patient states that when he thinks the wound is getting too dry, he applies topical Neosporin. At his last visit, on June 7 of this year, the podiatrist determined that he felt the wound was stalled and referred him to wound care for additional evaluation and management. An MRI has been ordered, but not yet scheduled or performed. Pathology from his operation in August 2022 demonstrated findings consistent with chronic osteomyelitis. Today, there is a large irregular wound on the plantar surface of his right foot, at about the level of the fifth metatarsal head. This tracks through to a pinpoint opening on the dorsal lateral portion of his foot. The intake nurse reported purulent drainage. There is some malodor from the wound. No frank necrosis  identified. 07/11/2021: Today, the wounds do not connect. I attempted multiple times from various directions and the shared tunnel is no longer open. He has some slough accumulation on the dorsal part of his foot as well as slough and callus buildup on the plantar surface. His MRI is scheduled for June 29. No purulent drainage or malodor appreciated today. 07/19/2021: The lateral foot wound has closed and there is no tunnel connecting the plantar foot wound to that site. The plantar foot wound still probes quite deeply, however. There is some slough, eschar, and nonviable tissue accumulated in the wound bed. No malodorous drainage present. His MRI was performed yesterday and is consistent with fairly extensive osteomyelitis. 07/29/2021: The patient has an appointment with infectious disease on July 18 to treat his osteomyelitis. The plantar wound still probes quite deeply, approaching bone. The orifice has narrowed quite substantially, however, making it more difficult for him to pack. 08/05/2021: The tunnel connecting the lateral foot wound to the plantar foot wound  has reopened. He sees infectious disease tomorrow to discuss long-term antibiotic treatment for osteomyelitis. There was a bit of murky drainage in the wound, but this was noted after he had had topical lidocaine applied so may have just been a blob of the anesthetic. No odor or frank purulent drainage. The wound probes deeply at the midfoot approaching bone. He does have MRI results confirming his diagnosis of osteomyelitis. He has failed to progress with conventional treatment and I think his best chance for preservation of the foot is to initiate hyperbaric oxygen therapy. 08/13/2021: The tunnel connecting the 2 wounds has closed again. He has a PICC line and is getting IV daptomycin and cefepime. EKG and chest x-ray are within normal limits. He is scheduled to start hyperbaric oxygen therapy tomorrow. The wound in his midfoot probes deeply, approaching bone. The skin at the orifice continues to heaped up and threatens to close over despite the large cavity within. No erythema, induration, or purulent drainage. The wound on his lateral foot is fairly small and quite clean. 08/19/2021: The lateral foot wound has nearly closed. The wound in his midfoot does not probe quite as deeply today. The skin at the orifice continues to try to roll in and obscure the opening. No frankly necrotic tissue appreciated. He is tolerating hyperbaric oxygen therapy. 08/26/2021: The lateral foot wound has closed completely. The wound in his midfoot is shallower again today. The wound orifice is contracting. We are using gentamicin and silver alginate. He continues on IV daptomycin and cefepime and is tolerating hyperbaric oxygen therapy without difficulty. Patient History Information obtained from Patient. Social History Never smoker, Marital Status - Married, Alcohol Use - Never, Drug Use - No History, Caffeine Use - Daily - T coffee. ea; Medical History Endocrine Patient has history of Type II  Diabetes Hospitalization/Surgery History - Amuptation of 3rd,4th and 5th toes of Right foot;Oral Surgery;Anal Fissure surgery; Cholecystectomy. Objective Constitutional No acute distress.. Vitals Time Taken: 7:44 AM, Height: 72 in, Weight: 312 lbs, BMI: 42.3, Temperature: 98.9 F, Pulse: 81 bpm, Respiratory Rate: 16 breaths/min, Blood Pressure: 123/76 mmHg. Respiratory Normal work of breathing on room air.. General Notes: 08/26/2021: The lateral foot wound has closed completely. The wound in his midfoot is shallower again today. The wound orifice is contracting. Integumentary (Hair, Skin) Wound #2 status is Open. Original cause of wound was Gradually Appeared. The date acquired was: 12/19/2016. The wound has been in treatment 7 weeks. The wound is located on the [place].  The wound measures 0.3cm length x 2cm width x 1.4cm depth; 0.471cm^2 area and 0.66cm^3 volume. There is Fat Layer (Subcutaneous Tissue) exposed. There is undermining starting at 3:00 and ending at 9:00 with a maximum distance of 0.5cm. There is a medium amount of serosanguineous drainage noted. The wound margin is thickened. There is small (1-33%) red granulation within the wound bed. There is a large (67-100%) amount of necrotic tissue within the wound bed including Adherent Slough. Wound #3R status is Healed - Epithelialized. Original cause of wound was Gradually Appeared. The date acquired was: 05/24/2016. The wound has been in treatment 6 weeks. The wound is located on the Right,Lateral Foot. The wound measures 0cm length x 0cm width x 0cm depth; 0cm^2 area and 0cm^3 volume. There is no tunneling or undermining noted. There is a none present amount of drainage noted. The wound margin is distinct with the outline attached to the wound base. There is no granulation within the wound bed. There is no necrotic tissue within the wound bed. Assessment Active Problems ICD-10 Type 2 diabetes mellitus with other  diabetic neurological complication Other chronic osteomyelitis, right ankle and foot Non-pressure chronic ulcer of other part of right foot with necrosis of bone Procedures Wound #2 Pre-procedure diagnosis of Wound #2 is a Diabetic Wound/Ulcer of the Lower Extremity located on the Right,Lateral,Plantar Foot .Severity of Tissue Pre Debridement is: Fat layer exposed. There was a Excisional Skin/Subcutaneous Tissue Debridement with a total area of 0.6 sq cm performed by [name], MD. With the following instrument(s): Curette to remove Non-Viable tissue/material. Material removed includes Subcutaneous Tissue and Slough and after achieving pain control using Lidocaine 5% topical ointment. No specimens were taken. A time out was conducted at 08:10, prior to the start of the procedure. A Minimum amount of bleeding was controlled with Pressure. The procedure was tolerated well with a pain level of 0 throughout and a pain level of 0 following the procedure. Post Debridement Measurements: 0.3cm length x 2cm width x 1.4cm depth; 0.66cm^3 volume. Character of Wound/Ulcer Post Debridement is improved. Severity of Tissue Post Debridement is: Fat layer exposed. Post procedure Diagnosis Wound #2: Same as Pre-Procedure Plan Follow-up Appointments: Return Appointment in 1 week. - Dr Celine Ahr Room 4 per [facility] 8/7//23 @ 07:30 am before HBO Mon 8/14 @ 10:30 am after HBO Bathing/ Shower/ Hygiene: Other Bathing/Shower/Hygiene Orders/Instructions: - Change dressing after bathing Off-Loading: Other: - Try to not stand on your feet too much Hyperbaric Oxygen Therapy: Evaluate for HBO Therapy Indication: - wagner grade 3 diabetic foot ulcer right foot 2.0 ATA for 90 Minutes without Air Breaks T Number of Treatments: - 40 otal One treatments per day (delivered Monday through Friday unless otherwise specified in Special Instructions below): Finger stick Blood Glucose Pre- and Post- HBOT Treatment. Follow  Hyperbaric Oxygen Glycemia Protocol Afrin (Oxymetazoline HCL) 0.05% nasal spray - 1 spray in both nostrils daily as needed prior to HBO treatment for difficulty clearing ears The following medication(s) was prescribed: gentamicin topical 0.1 % ointment Apply to wound with each dressing change starting 08/26/2021 WOUND #2: - Foot Wound Laterality: Plantar, Right, Lateral Cleanser: Soap and Water Every Other Day/15 Days Discharge Instructions: May shower and wash wound with dial antibacterial soap and water prior to dressing change. Cleanser: Wound Cleanser (Generic) Every Other Day/15 Days Discharge Instructions: Cleanse the wound with wound cleanser prior to applying a clean dressing using gauze sponges, not tissue or cotton balls. Peri-Wound Care: cotton tipped applicators (Generic) Every Other Day/15  Days Topical: Gentamicin Every Other Day/15 Days Discharge Instructions: As directed by physician Prim Dressing: KerraCel Ag Gelling Fiber Dressing, 4x5 in (silver alginate) (Generic) Every Other Day/15 Days ary Discharge Instructions: Apply silver alginate to wound bed as instructed Secured With: Paper T ape, 2x10 (in/yd) (Generic) Every Other Day/15 Days Discharge Instructions: Secure dressing with tape as directed. Com pression Wrap: Kerlix Roll 4.5x3.1 (in/yd) (Generic) Every Other Day/15 Days Discharge Instructions: Apply Kerlix and Coban compression as directed. 08/26/2021: The lateral foot wound has closed completely. The wound in his midfoot is shallower again today. The wound orifice is contracting. I used a curette to debride skin and nonviable subcutaneous tissue from the wound. The skin has a tendency to want to curl in and obscure the opening so it needs to be removed each week. We will continue using the gentamicin and silver alginate. Continue hyperbaric oxygen therapy. Follow-up in 1 week. Electronic Signature(s) Signed: 08/26/2021 8:26:09 AM By: [name] MD FACS Entered By:  [name] on 08/26/2021 08:26:09 -------------------------------------------------------------------------------- HxROS Details Patient Name: Date of Service: [name], [name] 08/26/2021 7:30 A M Medical Record Number: [id number] Patient Account Number: [id number] Date of Birth/Sex: Treating RN: [birth year] (51 y.o. [name] Primary Care Provider: [name] Other Clinician: Referring Provider: Treating Provider/Extender: [name] in Treatment: 7 Information Obtained From Patient Endocrine Medical History: Positive for: Type II Diabetes Immunizations Pneumococcal Vaccine: Received Pneumococcal Vaccination: Yes Received Pneumococcal Vaccination On or After 60th Birthday: No Implantable Devices None Hospitalization / Surgery History Type of Hospitalization/Surgery Amuptation of 3rd,4th and 5th toes of Right foot;Oral Surgery;Anal Fissure surgery; Cholecystectomy Family and Social History Never smoker; Marital Status - Married; Alcohol Use: Never; Drug Use: No History; Caffeine Use: Daily - T coffee; Financial Concerns: No; Food, ea; Clothing or Shelter Needs: No; Support System Lacking: No; Transportation Concerns: No Electronic Signature(s) Signed: 08/26/2021 8:42:34 AM By: [name] MD FACS Signed: 08/26/2021 5:07:27 PM By: [name] RN, BSN Entered By: [name] on 08/26/2021 08:23:17 -------------------------------------------------------------------------------- SuperBill Details Patient Name: Date of Service: [name], [name] 08/26/2021 Medical Record Number: [id number] Patient Account Number: [id number] Date of Birth/Sex: Treating RN: [birth year] (51 y.o. 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 Z36.644 Other chronic osteomyelitis, right ankle and foot L97.514 Non-pressure chronic ulcer of other part of right foot with necrosis of bone Facility Procedures CPT4 Code: 03474259 Description: 56387 - DEB SUBQ TISSUE 20 SQ CM/< ICD-10 Diagnosis Description L97.514 Non-pressure chronic ulcer of other part of right foot with necrosis of bone Modifier: Quantity: 1 Physician Procedures : CPT4 Code Description Modifier [id number] 51884 - WC PHYS LEVEL 4 - EST PT 25 ICD-10 Diagnosis Description L97.514 Non-pressure chronic ulcer of other part of right foot with necrosis of bone M86.671 Other chronic osteomyelitis, right ankle and foot  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

## 2021-08-27 ENCOUNTER — Encounter (HOSPITAL_BASED_OUTPATIENT_CLINIC_OR_DEPARTMENT_OTHER): Payer: BLUE CROSS/BLUE SHIELD | Admitting: Internal Medicine

## 2021-08-27 DIAGNOSIS — M86671 Other chronic osteomyelitis, right ankle and foot: Secondary | ICD-10-CM

## 2021-08-27 DIAGNOSIS — E11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ical complication: Secondary | ICD-10-CM | POA: Diagnosis not present

## 2021-08-27 DIAGNOSIS — L97514 Non-pressure chronic ulcer of other part of right foot with necrosis of bone: Secondary | ICD-10-CM

## 2021-08-27 LAB — GLUCOSE, CAPILLARY
Glucose-Capillary: 149 mg/dL — ABNORMAL HIGH (ref 70–99)
Glucose-Capillary: 154 mg/dL — ABNORMAL HIGH (ref 70–99)

## 2021-08-27 NOTE — Progress Notes (Addendum)
[name], [name] ([id number]) Visit Report for 08/27/2021 HBO Details Patient Name: Date of Service: [name], [name] 08/27/2021 8:00 A M Medical Record Number: [id number] Patient Account Number: [id number] Date of Birth/Sex: Treating RN: 1970-10-17 [name], [name] Primary Care Janah Mcculloh: [name] Other Clinician: [name] Referring Ad Guttman: Treating Canio Winokur/Extender: [name] in Treatment: 7 HBO Treatment Course Details Treatment Course Number: 1 Ordering Rasul Decola: [name] T Treatments Ordered: otal 40 HBO Treatment Start Date: 08/14/2021 HBO Indication: Diabetic Ulcer(s) of the Lower Extremity Standard/Conservative Wound Care tried and failed greater than or equal to 30 days Wound #2 Right, Lateral, Plantar Foot , Wound #3R Right, Lateral Foot HBO Treatment Details Treatment Number: 10 Patient Type: Outpatient Chamber Type: Monoplace Chamber Serial #: [id number] Treatment Protocol: 2.0 ATA with 90 minutes oxygen, and no air breaks Treatment Details Compression Rate Down: 2.0 psi / minute De-Compression Rate Up: 2.0 psi / minute Air breaks and breathing Decompress Decompress Compress Tx Pressure Begins Reached periods Begins Ends (leave unused spaces blank) Chamber Pressure (ATA 1 2 ------2 1 ) Clock Time (24 hr) 08:09 08:18 - - - - - - 09:48 09:55 Treatment Length: 106 (minutes) Treatment Segments: 4 Vital Signs Capillary Blood Glucose Reference Range: 80 - 120 mg / dl HBO Diabetic Blood Glucose Intervention Range: <131 mg/dl or >249 mg/dl Time Vitals Blood Respiratory Capillary Blood Glucose Pulse Action Type: Pulse: Temperature: Taken: Pressure: Rate: Glucose (mg/dl): Meter #: Oximetry (%) Taken: Pre 08:08 149/87 76 18 98.4 149 Post 10:02 144/88 67 16 97.2 154 Treatment Response Treatment Toleration: Well Treatment Completion Status: Treatment Completed without Adverse Event Additional  Procedure Documentation Tissue Sevierity: Necrosis of bone Physician HBO Attestation: I certify that I supervised this HBO treatment in accordance with Medicare guidelines. A trained emergency response team is readily available per Yes hospital policies and procedures. Continue HBOT as ordered. Yes Electronic Signature(s) Signed: 08/27/2021 1:52:58 PM By: [name] Previous Signature: 08/27/2021 11:10:47 AM Version By: [name] EMT Entered By: [name] Shan on 08/27/2021 13:51:28 -------------------------------------------------------------------------------- HBO Safety Checklist Details Patient Name: Date of Service: [name], [name] 08/27/2021 8:00 A M Medical Record Number: [id number] Patient Account Number: [id number] Date of Birth/Sex: Treating RN: Sep 05, 1970 [name], [name] Primary Care Kiearra Oyervides: [name] Other Clinician: [name] Referring Alle Difabio: Treating Calven Gilkes/Extender: [name] in Treatment: 7 HBO Safety Checklist Items Safety Checklist Consent Form Signed Patient voided / foley secured and emptied When did you last eato 0650 Last dose of injectable or oral agent 0649 Ostomy pouch emptied and vented if applicable NA All implantable devices assessed, documented and approved NA Intravenous access site secured and place NA Valuables secured Linens and cotton and cotton/polyester blend (less than 51% polyester) Personal oil-based products / skin lotions / body lotions removed Wigs or hairpieces removed NA Smoking or tobacco materials removed Books / newspapers / magazines / loose paper removed Cologne, aftershave, perfume and deodorant removed Jewelry removed (may wrap wedding band) NA Make-up removed NA Hair care products removed Battery operated devices (external) removed Heating patches and chemical warmers removed Titanium eyewear removed NA Nail polish cured greater than 10  hours NA Casting material cured greater than 10 hours NA Hearing aids removed Loose dentures or partials removed NA Prosthetics have been removed NA Patient demonstrates correct use of air break device (if applicable) Patient concerns have been addressed Patient grounding bracelet on and cord attached to chamber Specifics for Inpatients (  complete in addition to above) Medication sheet sent with patient NA Intravenous medications needed or due during therapy sent with patient NA Drainage tubes (e.g. nasogastric tube or chest tube secured and vented) NA Endotracheal or Tracheotomy tube secured NA Cuff deflated of air and inflated with saline NA Airway suctioned NA Notes The safety checklist was done before the treatment was started. Electronic Signature(s) Signed: 08/27/2021 11:08:50 AM By: [name] EMT Entered By: [name] on 08/27/2021 11:08:50

## 2021-08-27 NOTE — Progress Notes (Addendum)
[name], Hosie M. ([id number]) Visit Report for 08/27/2021 Arrival Information Details Patient Name: Date of Service: [place], [name] [name] 08/27/2021 8:00 A M Medical Record Number: [id number] Patient Account Number: [id number] Date of Birth/Sex: Treating RN: July 21, 1970 (51 y.o. [name], [name] Primary Care Deny Chevez: [name] Other Clinician: [name] Referring Lemonte Al: Treating Floyce Bujak/Extender: [name] in Treatment: 7 Visit Information History Since Last Visit All ordered tests and consults were completed: Yes Patient Arrived: Ambulatory Added or deleted any medications: No Arrival Time: 07:39 Any new allergies or adverse reactions: No Accompanied By: None Had a fall or experienced change in No Transfer Assistance: None activities of daily living that may affect Patient Identification Verified: Yes risk of falls: Secondary Verification Process Completed: Yes Signs or symptoms of abuse/neglect since last visito No Patient Requires Transmission-Based Precautions: No Hospitalized since last visit: No Patient Has Alerts: Yes Implantable device outside of the clinic excluding No Patient Alerts: ABI R 1.17 cellular tissue based products placed in the center ABI L 1.29 since last visit: R TBI 1.03 Pain Present Now: No L TBI 0.77 Electronic Signature(s) Signed: 08/27/2021 11:03:20 AM By: [name] EMT Entered By: [name] on 08/27/2021 11:03:20 -------------------------------------------------------------------------------- Encounter Discharge Information Details Patient Name: Date of Service: [place], [name] 08/27/2021 8:00 A M Medical Record Number: [id number] Patient Account Number: [id number] Date of Birth/Sex: Treating RN: 12-01-1970 (51 y.o. [name], [name] Primary Care Nasario Czerniak: [name] Other Clinician: [name] Referring Clarity Ciszek: Treating Alisen Marsiglia/Extender: [name]  in Treatment: 7 Encounter Discharge Information Items Discharge Condition: Stable Ambulatory Status: Ambulatory Discharge Destination: Home Transportation: Private Auto Accompanied By: None Schedule Follow-up Appointment: Yes Clinical Summary of Care: Electronic Signature(s) Signed: 08/27/2021 11:17:45 AM By: [name] EMT Entered By: [name] on 08/27/2021 11:17:45 -------------------------------------------------------------------------------- Vitals Details Patient Name: Date of Service: [name], [name] 08/27/2021 8:00 A M Medical Record Number: [id number] Patient Account Number: [id number] Date of Birth/Sex: Treating RN: 11-14-1970 (51 y.o. [name], [name] Primary Care Lizann Edelman: [name] Other Clinician: [name] Referring Aylin Rhoads: Treating Akiba Melfi/Extender: [name] in Treatment: 7 Vital Signs Time Taken: 08:08 Temperature (F): 98.4 Height (in): 72 Pulse (bpm): 76 Weight (lbs): 312 Respiratory Rate (breaths/min): 18 Body Mass Index (BMI): 42.3 Blood Pressure (mmHg): 149/87 Capillary Blood Glucose (mg/dl): 149 Reference Range: 80 - 120 mg / dl Electronic Signature(s) Signed: 08/27/2021 11:07:12 AM By: [name] EMT Entered By: [name] on 08/27/2021 11:07:12

## 2021-08-27 NOTE — Progress Notes (Signed)
[name], [name] ([id number]) Visit Report for 08/27/2021 Problem List Details Patient Name: Date of Service: [name], [name] 08/27/2021 8:00 A M Medical Record Number: [id number] Patient Account Number: [id number] Date of Birth/Sex: Treating RN: 1970/06/13 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 08/27/2021 Medical Record Number: [id number] Patient Account Number: [id number] Date of Birth/Sex: Treating RN: Feb 27, 1970 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 ME33.435Other chronic osteomyelitis, right ankle and foot L97.514  Non-pressure chronic ulcer of other part of right foot with necrosis of bone Modifier: Quantity: 4 Physician Procedures : CPT4 Code Description Modifier [id number] 72902- WC PHYS HYPERBARIC OXYGEN THERAPY ICD-10 Diagnosis Description 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

## 2021-08-28 ENCOUNTER — Encounter (HOSPITAL_BASED_OUTPATIENT_CLINIC_OR_DEPARTMENT_OTHER): Payer: BLUE CROSS/BLUE SHIELD | Admitting: General Surgery

## 2021-08-28 DIAGNOSIS — E11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ical complication: Secondary | ICD-10-CM | POA: Diagnosis not present

## 2021-08-28 LAB — GLUCOSE, CAPILLARY
Glucose-Capillary: 180 mg/dL — ABNORMAL HIGH (ref 70–99)
Glucose-Capillary: 159 mg/dL — ABNORMAL HIGH (ref 70–99)

## 2021-08-28 NOTE — Progress Notes (Addendum)
[name], Tad M. ([id number]) Visit Report for 08/28/2021 HBO Details Patient Name: Date of Service: [name], [place] [name] 08/28/2021 8:00 A M Medical Record Number: [id number] Patient Account Number: [id number] Date of Birth/Sex: Treating RN: Apr 20, 1970 (51 y.o. [name], [name] Primary Care Gurnie Duris: [name] Other Clinician: [name] Referring Rogue Rafalski: Treating Liany Mumpower/Extender: [name] in Treatment: 7 HBO Treatment Course Details Treatment Course Number: 1 Ordering Joseth Weigel: [name] T Treatments Ordered: otal 40 HBO Treatment Start Date: 08/14/2021 HBO Indication: Diabetic Ulcer(s) of the Lower Extremity Standard/Conservative Wound Care tried and failed greater than or equal to 30 days Wound #2 Right, Lateral, Plantar Foot , Wound #3R Right, Lateral Foot HBO Treatment Details Treatment Number: 11 Patient Type: Outpatient Chamber Type: Monoplace Chamber Serial #: [id number] Treatment Protocol: 2.0 ATA with 90 minutes oxygen, and no air breaks Treatment Details Compression Rate Down: 2.0 psi / minute De-Compression Rate Up: 2.0 psi / minute Air breaks and breathing Decompress Decompress Compress Tx Pressure Begins Reached periods Begins Ends (leave unused spaces blank) Chamber Pressure (ATA 1 2 ------2 1 ) Clock Time (24 hr) 08:10 08:18 - - - - - - 09:48 09:56 Treatment Length: 106 (minutes) Treatment Segments: 4 Vital Signs Capillary Blood Glucose Reference Range: 80 - 120 mg / dl HBO Diabetic Blood Glucose Intervention Range: <131 mg/dl or >249 mg/dl Time Vitals Blood Respiratory Capillary Blood Glucose Pulse Action Type: Pulse: Temperature: Taken: Pressure: Rate: Glucose (mg/dl): Meter #: Oximetry (%) Taken: Pre 08:06 130/87 81 16 98 180 Post 10:01 127/88 66 18 97.2 159 Treatment Response Treatment Toleration: Well Treatment Completion Status: Treatment Completed without Adverse Event Additional Procedure  Documentation Tissue Sevierity: Necrosis of bone Physician HBO Attestation: I certify that I supervised this HBO treatment in accordance with Medicare guidelines. A trained emergency response team is readily available per Yes hospital policies and procedures. Continue HBOT as ordered. Yes Electronic Signature(s) Signed: 08/28/2021 3:56:05 PM By: [name] MD FACS Previous Signature: 08/28/2021 10:22:46 AM Version By: [name] EMT Previous Signature: 08/28/2021 8:25:43 AM Version By: [name] EMT Previous Signature: 08/28/2021 8:25:43 AM Version By: [name] EMT Entered By: [name] on 08/28/2021 15:56:04 -------------------------------------------------------------------------------- HBO Safety Checklist Details Patient Name: Date of Service: [name], [name] 08/28/2021 8:00 A M Medical Record Number: [id number] Patient Account Number: [id number] Date of Birth/Sex: Treating RN: [birth year] (51 y.o. [name], [name] Primary Care Antar Milks: [name] Other Clinician: [name] Referring Ceairra Mccarver: Treating Eliyanna Ault/Extender: [name] in Treatment: 7 HBO Safety Checklist Items Safety Checklist Consent Form Signed Patient voided / foley secured and emptied When did you last eato 0700 Last dose of injectable or oral agent 0700 Ostomy pouch emptied and vented if applicable NA All implantable devices assessed, documented and approved PIC line Intravenous access site secured and place PIC Line Valuables secured Linens and cotton and cotton/polyester blend (less than 51% polyester) Personal oil-based products / skin lotions / body lotions removed Wigs or hairpieces removed NA Smoking or tobacco materials removed Books / newspapers / magazines / loose paper removed Cologne, aftershave, perfume and deodorant removed Jewelry removed (may wrap wedding band) NA Make-up removed NA Hair care products removed Battery  operated devices (external) removed Heating patches and chemical warmers removed Titanium eyewear removed NA Nail polish cured greater than 10 hours NA Casting material cured greater than 10 hours NA Hearing aids removed NA Loose dentures or partials removed NA Prosthetics have been removed NA Patient demonstrates correct  use of air break device (if applicable) Patient concerns have been addressed Patient grounding bracelet on and cord attached to chamber Specifics for Inpatients (complete in addition to above) Medication sheet sent with patient NA Intravenous medications needed or due during therapy sent with patient NA Drainage tubes (e.g. nasogastric tube or chest tube secured and vented) NA Endotracheal or Tracheotomy tube secured NA Cuff deflated of air and inflated with saline NA Airway suctioned NA Notes The safety checklist was done before the treatment was started. Electronic Signature(s) Signed: 08/28/2021 8:25:06 AM By: [name] EMT Entered By: [name] on 08/28/2021 08:25:06

## 2021-08-28 NOTE — Progress Notes (Signed)
[name], [name] ([id number]) Visit Report for 08/28/2021 Problem List Details Patient Name: Date of Service: [name], [name] 08/28/2021 8:00 A M Medical Record Number: [id number] Patient Account Number: [id number] Date of Birth/Sex: Treating RN: [birth year] (51 y.o. 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 08/28/2021 Medical Record Number: [id number] Patient Account Number: [id number] Date of Birth/Sex: Treating RN: Feb 16, 1970 (51 y.o. 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 ME28.003Other chronic osteomyelitis, right ankle and foot L97.514  Non-pressure chronic ulcer of other part of right foot with necrosis of bone Modifier: Quantity: 4 Physician Procedures : CPT4 Code Description Modifier [id number] 05697- WC PHYS HYPERBARIC OXYGEN THERAPY ICD-10 Diagnosis Description 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

## 2021-08-28 NOTE — Progress Notes (Addendum)
[name], [name] ([id number]) Visit Report for 08/28/2021 Arrival Information Details Patient Name: Date of Service: [name], [name] 08/28/2021 8:00 A M Medical Record Number: [id number] Patient Account Number: [id number] Date of Birth/Sex: Treating RN: [birth year] [name], [name] Primary Care Samora Jernberg: [name] Other Clinician: [name] Referring Hunter Pinkard: Treating Nils Thor/Extender: [name] in Treatment: 7 Visit Information History Since Last Visit All ordered tests and consults were completed: Yes Patient Arrived: Ambulatory Added or deleted any medications: No Arrival Time: 07:44 Any new allergies or adverse reactions: No Accompanied By: None Had a fall or experienced change in No Transfer Assistance: None activities of daily living that may affect Patient Identification Verified: Yes risk of falls: Secondary Verification Process Completed: Yes Signs or symptoms of abuse/neglect since last visito No Patient Requires Transmission-Based Precautions: No Hospitalized since last visit: No Patient Has Alerts: Yes Implantable device outside of the clinic excluding No Patient Alerts: ABI R 1.17 cellular tissue based products placed in the center ABI L 1.29 since last visit: R TBI 1.03 Pain Present Now: No L TBI 0.77 Electronic Signature(s) Signed: 08/28/2021 8:23:14 AM By: [name] EMT Entered By: [name] on 08/28/2021 08:23:14 -------------------------------------------------------------------------------- Encounter Discharge Information Details Patient Name: Date of Service: [name], [name] 08/28/2021 8:00 A M Medical Record Number: [id number] Patient Account Number: [id number] Date of Birth/Sex: Treating RN: [birth year] [name] Primary Care Haylei Cobin: [name] Other Clinician: [name] Referring Dajanique Robley: Treating Minal Stuller/Extender: [name] in  Treatment: 7 Encounter Discharge Information Items Discharge Condition: Stable Ambulatory Status: Ambulatory Discharge Destination: Home Transportation: Private Auto Accompanied By: None Schedule Follow-up Appointment: Yes Clinical Summary of Care: Electronic Signature(s) Signed: 08/28/2021 10:23:43 AM By: [name] EMT Entered By: [name] on 08/28/2021 10:23:43 -------------------------------------------------------------------------------- Vitals Details Patient Name: Date of Service: [name], [name] 08/28/2021 8:00 A M Medical Record Number: [id number] Patient Account Number: [id number] Date of Birth/Sex: Treating RN: 1970-08-21 [name], [name] Primary Care Hiyab Nhem: [name] Other Clinician: [name] Referring Lamona Eimer: Treating Janis Cuffe/Extender: [name] in Treatment: 7 Vital Signs Time Taken: 08:06 Temperature (F): 98.0 Height (in): 72 Pulse (bpm): 81 Weight (lbs): 312 Respiratory Rate (breaths/min): 16 Body Mass Index (BMI): 42.3 Blood Pressure (mmHg): 130/87 Capillary Blood Glucose (mg/dl): 180 Reference Range: 80 - 120 mg / dl Electronic Signature(s) Signed: 08/28/2021 8:23:42 AM By: [name] EMT Entered By: [name] on 08/28/2021 08:23:42

## 2021-08-29 ENCOUNTER — Encounter (HOSPITAL_BASED_OUTPATIENT_CLINIC_OR_DEPARTMENT_OTHER): Payer: BLUE CROSS/BLUE SHIELD | Admitting: General Surgery

## 2021-08-29 DIAGNOSIS — E11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ical complication: Secondary | ICD-10-CM | POA: Diagnosis not present

## 2021-08-29 LAB — GLUCOSE, CAPILLARY
Glucose-Capillary: 139 mg/dL — ABNORMAL HIGH (ref 70–99)
Glucose-Capillary: 136 mg/dL — ABNORMAL HIGH (ref 70–99)

## 2021-08-29 NOTE — Progress Notes (Addendum)
[name], [name] ([id number]) Visit Report for 08/29/2021 HBO Details Patient Name: Date of Service: [name], [place] [name] 08/29/2021 8:00 A M Medical Record Number: [id number] Patient Account Number: [id number] Date of Birth/Sex: Treating RN: 1970/09/19 [name] Primary Care [name]: [name] Other Clinician: [name] Referring Desman Polak: Treating Charisse Wendell/Extender: [name] in Treatment: 8 HBO Treatment Course Details Treatment Course Number: 1 Ordering Colt Martelle: [name] T Treatments Ordered: otal 40 HBO Treatment Start Date: 08/14/2021 HBO Indication: Diabetic Ulcer(s) of the Lower Extremity Standard/Conservative Wound Care tried and failed greater than or equal to 30 days Wound #2 Right, Lateral, Plantar Foot , Wound #3R Right, Lateral Foot HBO Treatment Details Treatment Number: 12 Patient Type: Outpatient Chamber Type: Monoplace Chamber Serial #: [id number] Treatment Protocol: 2.0 ATA with 90 minutes oxygen, and no air breaks Treatment Details Compression Rate Down: 2.0 psi / minute De-Compression Rate Up: 2.0 psi / minute Air breaks and breathing Decompress Decompress Compress Tx Pressure Begins Reached periods Begins Ends (leave unused spaces blank) Chamber Pressure (ATA 1 2 ------2 1 ) Clock Time (24 hr) 08:10 08:19 - - - - - - 09:50 09:57 Treatment Length: 107 (minutes) Treatment Segments: 4 Vital Signs Capillary Blood Glucose Reference Range: 80 - 120 mg / dl HBO Diabetic Blood Glucose Intervention Range: <131 mg/dl or >249 mg/dl Time Vitals Blood Respiratory Capillary Blood Glucose Pulse Action Type: Pulse: Temperature: Taken: Pressure: Rate: Glucose (mg/dl): Meter #: Oximetry (%) Taken: Pre 08:04 124/77 86 16 98.1 139 Post 10:02 139/77 70 16 97.1 136 Treatment Response Treatment Toleration: Well Treatment Completion Status: Treatment Completed without Adverse Event Additional  Procedure Documentation Tissue Sevierity: Necrosis of bone Physician HBO Attestation: I certify that I supervised this HBO treatment in accordance with Medicare guidelines. A trained emergency response team is readily available per Yes hospital policies and procedures. Continue HBOT as ordered. Yes Electronic Signature(s) Signed: 08/29/2021 11:39:05 AM By: [name] MD FACS Previous Signature: 08/29/2021 10:14:33 AM Version By: [name] EMT Previous Signature: 08/29/2021 11:38:15 AM Version By: [name] MD FACS Previous Signature: 08/29/2021 11:38:15 AM Version By: [name] MD FACS Entered By: [name] on 08/29/2021 11:39:05 -------------------------------------------------------------------------------- HBO Safety Checklist Details Patient Name: Date of Service: [name] 08/29/2021 8:00 A M Medical Record Number: [id number] Patient Account Number: [id number] Date of Birth/Sex: Treating RN: 22-Jul-1970 [name] Primary Care [name]: [name] Other Clinician: [name] Referring [name]: Treating [name]/Extender: [name] in Treatment: 8 HBO Safety Checklist Items Safety Checklist Consent Form Signed Patient voided / foley secured and emptied When did you last eato 0645 Last dose of injectable or oral agent 0645 Ostomy pouch emptied and vented if applicable NA All implantable devices assessed, documented and approved NA Intravenous access site secured and place NA Valuables secured Linens and cotton and cotton/polyester blend (less than 51% polyester) Personal oil-based products / skin lotions / body lotions removed Wigs or hairpieces removed NA Smoking or tobacco materials removed Books / newspapers / magazines / loose paper removed Cologne, aftershave, perfume and deodorant removed Jewelry removed (may wrap wedding band) NA Make-up removed NA Hair care products  removed Battery operated devices (external) removed Heating patches and chemical warmers removed Titanium eyewear removed NA Nail polish cured greater than 10 hours NA Casting material cured greater than 10 hours NA Hearing aids removed NA Loose dentures or partials removed NA Prosthetics have been removed NA Patient demonstrates correct  use of air break device (if applicable) Patient concerns have been addressed Patient grounding bracelet on and cord attached to chamber Specifics for Inpatients (complete in addition to above) Medication sheet sent with patient NA Intravenous medications needed or due during therapy sent with patient NA Drainage tubes (e.g. nasogastric tube or chest tube secured and vented) NA Endotracheal or Tracheotomy tube secured NA Cuff deflated of air and inflated with saline NA Airway suctioned NA Notes The safety checklist was done before the treatment was started. Electronic Signature(s) Signed: 08/29/2021 9:55:02 AM By: [name] EMT Entered By: [name] on 08/29/2021 09:55:02

## 2021-08-29 NOTE — Progress Notes (Signed)
[name], [name] ([id number]) Visit Report for 08/29/2021 Problem List Details Patient Name: Date of Service: [name] 08/29/2021 8:00 A M Medical Record Number: [id number] Patient Account Number: [id number] Date of Birth/Sex: Treating RN: [birth year] 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 08/29/2021 Medical Record Number: [id number] Patient Account Number: [id number] Date of Birth/Sex: Treating RN: Sep 12, 1970 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 LH03.888Non-pressure chronic ulcer of other part of right foot with necrosis  of bone M86.671 Other chronic osteomyelitis, right ankle and foot Modifier: Quantity: 4 Physician Procedures : CPT4 Code Description Modifier [id number] PHYS HYPERBARIC OXYGEN THERAPY ICD-10 Diagnosis Description 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

## 2021-08-29 NOTE — Progress Notes (Addendum)
[name], Obadiah M. ([id number]) Visit Report for 08/29/2021 Arrival Information Details Patient Name: Date of Service: [place], [place] [name] 08/29/2021 8:00 A M Medical Record Number: [id number] Patient Account Number: [id number] Date of Birth/Sex: Treating RN: February 25, 1970 (51 y.o. [name] Primary Care Uriel Horkey: [name] Other Clinician: [name] Referring Myles Tavella: Treating Katasha Riga/Extender: [name] in Treatment: 8 Visit Information History Since Last Visit All ordered tests and consults were completed: Yes Patient Arrived: Ambulatory Added or deleted any medications: No Arrival Time: 07:41 Any new allergies or adverse reactions: No Accompanied By: None Had a fall or experienced change in No Transfer Assistance: None activities of daily living that may affect Patient Identification Verified: Yes risk of falls: Secondary Verification Process Completed: Yes Signs or symptoms of abuse/neglect since last visito No Patient Requires Transmission-Based Precautions: No Hospitalized since last visit: No Patient Has Alerts: Yes Implantable device outside of the clinic excluding No Patient Alerts: ABI R 1.17 cellular tissue based products placed in the center ABI L 1.29 since last visit: R TBI 1.03 Pain Present Now: No L TBI 0.77 Electronic Signature(s) Signed: 08/29/2021 9:53:30 AM By: [name] EMT Entered By: [name] on 08/29/2021 09:53:30 -------------------------------------------------------------------------------- Encounter Discharge Information Details Patient Name: Date of Service: [place], [name] 08/29/2021 8:00 A M Medical Record Number: [id number] Patient Account Number: [id number] Date of Birth/Sex: Treating RN: [birth year] (51 y.o. [name] Primary Care Shuntel Fishburn: [name] Other Clinician: [name] Referring Darin Arndt: Treating Marshia Tropea/Extender: [name]  in Treatment: 8 Encounter Discharge Information Items Discharge Condition: Stable Ambulatory Status: Ambulatory Discharge Destination: Home Transportation: Private Auto Accompanied By: None Schedule Follow-up Appointment: Yes Clinical Summary of Care: Electronic Signature(s) Signed: 08/29/2021 10:15:36 AM By: [name] EMT Entered By: [name] on 08/29/2021 10:15:36 -------------------------------------------------------------------------------- Vitals Details Patient Name: Date of Service[name] 08/29/2021 8:00 A M Medical Record Number: [id number] Patient Account Number: [id number] Date of Birth/Sex: Treating RN: [birth year] (51 y.o. [name] Primary Care Bessie Livingood: [name] Other Clinician: [name] Referring Omayra Tulloch: Treating Harim Bi/Extender: [name] in Treatment: 8 Vital Signs Time Taken: 08:04 Temperature (F): 98.1 Height (in): 72 Pulse (bpm): 86 Weight (lbs): 312 Respiratory Rate (breaths/min): 16 Body Mass Index (BMI): 42.3 Blood Pressure (mmHg): 124/77 Capillary Blood Glucose (mg/dl): 139 Reference Range: 80 - 120 mg / dl Electronic Signature(s) Signed: 08/29/2021 9:53:57 AM By: [name] EMT Entered By: [name] on 08/29/2021 09:53:57

## 2021-08-30 ENCOUNTER — Encounter (HOSPITAL_BASED_OUTPATIENT_CLINIC_OR_DEPARTMENT_OTHER): Payer: BLUE CROSS/BLUE SHIELD | Admitting: Internal Medicine

## 2021-08-30 DIAGNOSIS — M86671 Other chronic osteomyelitis, right ankle and foot: Secondary | ICD-10-CM

## 2021-08-30 DIAGNOSIS — E11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ical complication: Secondary | ICD-10-CM

## 2021-08-30 DIAGNOSIS — L97514 Non-pressure chronic ulcer of other part of right foot with necrosis of bone: Secondary | ICD-10-CM | POA: Diagnosis not present

## 2021-08-30 LAB — GLUCOSE, CAPILLARY
Glucose-Capillary: 163 mg/dL — ABNORMAL HIGH (ref 70–99)
Glucose-Capillary: 178 mg/dL — ABNORMAL HIGH (ref 70–99)

## 2021-08-30 NOTE — Progress Notes (Addendum)
[name], [name] ([id number]) Visit Report for 08/30/2021 HBO Details Patient Name: Date of Service: [name] 08/30/2021 8:00 A M Medical Record Number: [id number] Patient Account Number: [id number] Date of Birth/Sex: Treating RN: 1971-01-07 [name] Primary Care Sanae Willetts: [name] Other Clinician: [name] Referring [name]: Treating Ronasia Isola/Extender: [name] in Treatment: 8 HBO Treatment Course Details Treatment Course Number: 1 Ordering [name]: [name] T Treatments Ordered: otal 40 HBO Treatment Start Date: 08/14/2021 HBO Indication: Diabetic Ulcer(s) of the Lower Extremity Standard/Conservative Wound Care tried and failed greater than or equal to 30 days Wound #2 Right, Lateral, Plantar Foot , Wound #3R Right, Lateral Foot HBO Treatment Details Treatment Number: 13 Patient Type: Outpatient Chamber Type: Monoplace Chamber Serial #: [id number] Treatment Protocol: 2.0 ATA with 90 minutes oxygen, and no air breaks Treatment Details Compression Rate Down: 2.0 psi / minute De-Compression Rate Up: 2.0 psi / minute Air breaks and breathing Decompress Decompress Compress Tx Pressure Begins Reached periods Begins Ends (leave unused spaces blank) Chamber Pressure (ATA 1 2 ------2 1 ) Clock Time (24 hr) 08:18 08:27 - - - - - - 09:57 10:04 Treatment Length: 106 (minutes) Treatment Segments: 4 Vital Signs Capillary Blood Glucose Reference Range: 80 - 120 mg / dl HBO Diabetic Blood Glucose Intervention Range: <131 mg/dl or >249 mg/dl Time Vitals Blood Respiratory Capillary Blood Glucose Pulse Action Type: Pulse: Temperature: Taken: Pressure: Rate: Glucose (mg/dl): Meter #: Oximetry (%) Taken: Pre 07:57 125/78 98 16 98.4 163 Post 10:09 139/87 72 18 97.4 178 Treatment Response Treatment Toleration: Well Treatment Completion Status: Treatment Completed without Adverse Event Additional  Procedure Documentation Tissue Sevierity: Necrosis of bone Physician HBO Attestation: I certify that I supervised this HBO treatment in accordance with Medicare guidelines. A trained emergency response team is readily available per Yes hospital policies and procedures. Continue HBOT as ordered. Yes Electronic Signature(s) Signed: 08/30/2021 1:41:40 PM By: [name] Shan DO Previous Signature: 08/30/2021 10:31:49 AM Version By: [name] EMT Previous Signature: 08/30/2021 10:29:21 AM Version By: [name] EMT Previous Signature: 08/30/2021 10:29:21 AM Version By: [name] EMT Previous Signature: 08/30/2021 8:33:51 AM Version By: [name] EMT Entered By: [name] on 08/30/2021 12:30:16 -------------------------------------------------------------------------------- HBO Safety Checklist Details Patient Name: Date of Service: [name] 08/30/2021 8:00 A M Medical Record Number: [id number] Patient Account Number: [id number] Date of Birth/Sex: Treating RN: 16-Apr-1970 [name] Primary Care Haakon Titsworth: [name] Other Clinician: [name] Referring Stockton Nunley: Treating Brylin Stanislawski/Extender: [name] in Treatment: 8 HBO Safety Checklist Items Safety Checklist Consent Form Signed Patient voided / foley secured and emptied When did you last eato 0700 Last dose of injectable or oral agent 0623 Ostomy pouch emptied and vented if applicable NA All implantable devices assessed, documented and approved PIC line Intravenous access site secured and place PIC Line Valuables secured Linens and cotton and cotton/polyester blend (less than 51% polyester) Personal oil-based products / skin lotions / body lotions removed Wigs or hairpieces removed NA Smoking or tobacco materials removed Books / newspapers / magazines / loose paper removed Cologne, aftershave, perfume and deodorant removed Jewelry removed  (may wrap wedding band) NA Make-up removed NA Hair care products removed Battery operated devices (external) removed Heating patches and chemical warmers removed Titanium eyewear removed Nail polish cured greater than 10 hours NA Casting material cured greater than 10 hours NA Hearing aids removed NA Loose dentures or partials removed NA  Prosthetics have been removed NA Patient demonstrates correct use of air break device (if applicable) Patient concerns have been addressed Patient grounding bracelet on and cord attached to chamber Specifics for Inpatients (complete in addition to above) Medication sheet sent with patient NA Intravenous medications needed or due during therapy sent with patient NA Drainage tubes (e.g. nasogastric tube or chest tube secured and vented) NA Endotracheal or Tracheotomy tube secured NA Cuff deflated of air and inflated with saline NA Airway suctioned NA Notes The safety checklist was done before the treatment was started. Electronic Signature(s) Signed: 08/30/2021 8:33:11 AM By: [name] EMT Entered By: [name] on 08/30/2021 08:33:11

## 2021-08-30 NOTE — Progress Notes (Addendum)
[name], [name] ([id number]) Visit Report for 08/30/2021 Arrival Information Details Patient Name: Date of Service: [name],  [name] 08/30/2021 8:00 A M Medical Record Number: [id number] Patient Account Number: [id number] Date of Birth/Sex: Treating RN: [birth year] [name] Primary Care [name]: [name] Other Clinician: Valeria [name] Referring [name]: Treating Tinaya [name]/Extender: [name] in Treatment: 8 Visit Information History Since Last Visit All ordered tests and consults were completed: Yes Patient Arrived: Ambulatory Added or deleted any medications: No Arrival Time: 07:48 Any new allergies or adverse reactions: No Accompanied By: None Had a fall or experienced change in No Transfer Assistance: None activities of daily living that may affect Patient Requires Transmission-Based Precautions: No risk of falls: Patient Has Alerts: Yes Signs or symptoms of abuse/neglect since last visito No Patient Alerts: ABI R 1.17 Hospitalized since last visit: No ABI L 1.29 Implantable device outside of the clinic excluding No R TBI 1.03 cellular tissue based products placed in the center L TBI 0.77 since last visit: Pain Present Now: No Electronic Signature(s) Signed: 08/30/2021 8:31:14 AM By: [name] EMT Entered By: [name] on 08/30/2021 08:31:13 -------------------------------------------------------------------------------- Encounter Discharge Information Details Patient Name: Date of [name] 08/30/2021 8:00 A M Medical Record Number: [id number] Patient Account Number: [id number] Date of Birth/Sex: Treating RN: 1970/10/06 [name] Primary Care Nasra Counce: [name] Other Clinician: Valeria [name] Referring [name]: Treating [name]/Extender: [name] in Treatment: 8 Encounter Discharge Information Items Discharge Condition:  Stable Ambulatory Status: Ambulatory Discharge Destination: Home Transportation: Private Auto Accompanied By: None Schedule Follow-up Appointment: No Clinical Summary of Care: Electronic Signature(s) Signed: 08/30/2021 10:30:45 AM By: [name] EMT Entered By: [name] on 08/30/2021 10:30:45 -------------------------------------------------------------------------------- Vitals Details Patient Name: Date of Service[name] 08/30/2021 8:00 A M Medical Record Number: [id number] Patient Account Number: [id number] Date of Birth/Sex: Treating RN: 29-Nov-1970 [name] Primary Care [name]: [name] Other Clinician: Valeria [name] Referring [name]: Treating [name]/Extender: [name] in Treatment: 8 Vital Signs Time Taken: 07:57 Temperature (F): 98.4 Height (in): 72 Pulse (bpm): 98 Weight (lbs): 312 Respiratory Rate (breaths/min): 16 Body Mass Index (BMI): 42.3 Blood Pressure (mmHg): 125/78 Capillary Blood Glucose (mg/dl): 163 Reference Range: 80 - 120 mg / dl Electronic Signature(s) Signed: 08/30/2021 8:31:51 AM By: [name] EMT Entered By: [name] on 08/30/2021 08:31:50

## 2021-08-30 NOTE — Progress Notes (Signed)
[name], [name] ([id number]) Visit Report for 08/30/2021 Problem List Details Patient Name: Date of Service: [name], [name] 08/30/2021 8:00 A M Medical Record Number: [id number] Patient Account Number: [id number] Date of Birth/Sex: Treating RN: 05-Mar-1970 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 08/30/2021 Medical Record Number: [id number] Patient Account Number: [id number] Date of Birth/Sex: Treating RN: 08-Apr-1970 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 LI37.048Non-pressure chronic ulcer of other part of right foot with necrosis  of bone M86.671 Other chronic osteomyelitis, right ankle and foot Modifier: Quantity: 4 Physician Procedures : CPT4 Code Description Modifier [id number] 50388- WC PHYS HYPERBARIC OXYGEN THERAPY ICD-10 Diagnosis Description 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

## 2021-09-02 ENCOUNTER — Encounter (HOSPITAL_BASED_OUTPATIENT_CLINIC_OR_DEPARTMENT_OTHER): Payer: BLUE CROSS/BLUE SHIELD | Admitting: Internal Medicine

## 2021-09-02 ENCOUNTER — Encounter (HOSPITAL_BASED_OUTPATIENT_CLINIC_OR_DEPARTMENT_OTHER): Payer: BLUE CROSS/BLUE SHIELD | Admitting: General Surgery

## 2021-09-02 ENCOUNTER — Telehealth: Payer: Self-pay | Admitting: Family Medicine

## 2021-09-02 DIAGNOSIS — M86671 Other chronic osteomyelitis, right ankle and foot: Secondary | ICD-10-CM

## 2021-09-02 DIAGNOSIS — L97514 Non-pressure chronic ulcer of other part of right foot with necrosis of bone: Secondary | ICD-10-CM | POA: Diagnosis not present

## 2021-09-02 DIAGNOSIS — E11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ical complication: Secondary | ICD-10-CM

## 2021-09-02 LAB — GLUCOSE, CAPILLARY
Glucose-Capillary: 191 mg/dL — ABNORMAL HIGH (ref 70–99)
Glucose-Capillary: 158 mg/dL — ABNORMAL HIGH (ref 70–99)

## 2021-09-02 MED ORDER — TRESIBA FLEXTOUCH 200 UNIT/ML ~~LOC~~ SOPN: 80.0000 [IU] | PEN_INJECTOR | Freq: Every day | SUBCUTANEOUS | 3 refills | Status: DC

## 2021-09-02 NOTE — Telephone Encounter (Signed)
States patient is now requesting to transistion from the tresiba  vial over to the tresiba flextouch pen.  Is requesting script to be sent to [facility] in [place].

## 2021-09-02 NOTE — Telephone Encounter (Signed)
Rx sent to pharmacy   

## 2021-09-03 ENCOUNTER — Encounter (HOSPITAL_BASED_OUTPATIENT_CLINIC_OR_DEPARTMENT_OTHER): Payer: BLUE CROSS/BLUE SHIELD | Admitting: Internal Medicine

## 2021-09-03 DIAGNOSIS — E11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ical complication: Secondary | ICD-10-CM | POA: Diagnosis not present

## 2021-09-03 DIAGNOSIS — M86671 Other chronic osteomyelitis, right ankle and foot: Secondary | ICD-10-CM

## 2021-09-03 DIAGNOSIS — L97514 Non-pressure chronic ulcer of other part of right foot with necrosis of bone: Secondary | ICD-10-CM

## 2021-09-03 LAB — GLUCOSE, CAPILLARY
Glucose-Capillary: 182 mg/dL — ABNORMAL HIGH (ref 70–99)
Glucose-Capillary: 231 mg/dL — ABNORMAL HIGH (ref 70–99)

## 2021-09-04 ENCOUNTER — Encounter (HOSPITAL_BASED_OUTPATIENT_CLINIC_OR_DEPARTMENT_OTHER): Payer: BLUE CROSS/BLUE SHIELD | Admitting: General Surgery

## 2021-09-04 ENCOUNTER — Ambulatory Visit: Payer: BLUE CROSS/BLUE SHIELD | Admitting: Internal Medicine

## 2021-09-04 DIAGNOSIS — E11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ical complication: Secondary | ICD-10-CM | POA: Diagnosis not present

## 2021-09-04 LAB — GLUCOSE, CAPILLARY
Glucose-Capillary: 153 mg/dL — ABNORMAL HIGH (ref 70–99)
Glucose-Capillary: 201 mg/dL — ABNORMAL HIGH (ref 70–99)

## 2021-09-04 NOTE — Progress Notes (Addendum)
[name], [name] ([id number]) Visit Report for 09/03/2021 HBO Details Patient Name: Date of Service: [name], [name] 09/03/2021 8:00 A M Medical Record Number: [id number] Patient Account Number: [id number] Date of Birth/Sex: Treating RN: [birth year] [name] Primary Care Shalika Arntz: [name] Other Clinician: [name] Referring Jonaven Hilgers: Treating Melaney Tellefsen/Extender: [name] in Treatment: 8 HBO Treatment Course Details Treatment Course Number: 1 Ordering Tayler Lassen: [name] T Treatments Ordered: otal 40 HBO Treatment Start Date: 08/14/2021 HBO Indication: Diabetic Ulcer(s) of the Lower Extremity Standard/Conservative Wound Care tried and failed greater than or equal to 30 days Wound #2 Right, Lateral, Plantar Foot , Wound #3R Right, Lateral Foot HBO Treatment Details Treatment Number: 15 Patient Type: Outpatient Chamber Type: Monoplace Chamber Serial #: [id number] Treatment Protocol: 2.0 ATA with 90 minutes oxygen, and no air breaks Treatment Details Compression Rate Down: 2.0 psi / minute De-Compression Rate Up: 2.0 psi / minute Air breaks and breathing Decompress Decompress Compress Tx Pressure Begins Reached periods Begins Ends (leave unused spaces blank) Chamber Pressure (ATA 1 2 ------2 1 ) Clock Time (24 hr) 08:12 08:20 - - - - - - 09:50 09:59 Treatment Length: 107 (minutes) Treatment Segments: 4 Vital Signs Capillary Blood Glucose Reference Range: 80 - 120 mg / dl HBO Diabetic Blood Glucose Intervention Range: <131 mg/dl or >249 mg/dl Time Vitals Blood Respiratory Capillary Blood Glucose Pulse Action Type: Pulse: Temperature: Taken: Pressure: Rate: Glucose (mg/dl): Meter #: Oximetry (%) Taken: Pre 08:06 125/75 97 16 97.9 182 Post 10:02 136/88 72 18 97.2 231 Treatment Response Treatment Toleration: Well Treatment Completion Status: Treatment Completed without Adverse Event Additional  Procedure Documentation Tissue Sevierity: Necrosis of bone Physician HBO Attestation: I certify that I supervised this HBO treatment in accordance with Medicare guidelines. A trained emergency response team is readily available per Yes hospital policies and procedures. Continue HBOT as ordered. Yes Electronic Signature(s) Signed: 09/03/2021 4:20:15 PM By: [name] DO Previous Signature: 09/03/2021 10:11:51 AM Version By: [name] EMT Previous Signature: 09/03/2021 9:13:48 AM Version By: [name] EMT Previous Signature: 09/03/2021 9:13:48 AM Version By: [name] EMT Entered By: [name] on 09/03/2021 12:30:42 -------------------------------------------------------------------------------- HBO Safety Checklist Details Patient Name: Date of Service: [name], [name] 09/03/2021 8:00 A M Medical Record Number: [id number] Patient Account Number: [id number] Date of Birth/Sex: Treating RN: Jun 14, 1970 [name] Primary Care [name]: [name] Other Clinician: [name] Referring [name]: Treating Precilla Purnell/Extender: [name] in Treatment: 8 HBO Safety Checklist Items Safety Checklist Consent Form Signed Patient voided / foley secured and emptied When did you last eato 0710 Last dose of injectable or oral agent 0650 Ostomy pouch emptied and vented if applicable NA All implantable devices assessed, documented and approved PIC line Intravenous access site secured and place PIC Line Valuables secured Linens and cotton and cotton/polyester blend (less than 51% polyester) Personal oil-based products / skin lotions / body lotions removed Wigs or hairpieces removed NA Smoking or tobacco materials removed Books / newspapers / magazines / loose paper removed Cologne, aftershave, perfume and deodorant removed Jewelry removed (may wrap wedding band) NA Make-up removed NA Hair care products  removed Battery operated devices (external) removed Heating patches and chemical warmers removed Titanium eyewear removed NA Nail polish cured greater than 10 hours NA Casting material cured greater than 10 hours NA Hearing aids removed NA Loose dentures or partials removed NA Prosthetics have been removed NA Patient demonstrates correct use  of air break device (if applicable) Patient concerns have been addressed Patient grounding bracelet on and cord attached to chamber Specifics for Inpatients (complete in addition to above) Medication sheet sent with patient NA Intravenous medications needed or due during therapy sent with patient NA Drainage tubes (e.g. nasogastric tube or chest tube secured and vented) NA Endotracheal or Tracheotomy tube secured NA Cuff deflated of air and inflated with saline NA Airway suctioned NA Notes The safety checklist was done before the treatment was started. Electronic Signature(s) Signed: 09/03/2021 9:12:59 AM By: [name] EMT Entered By: [name] on 09/03/2021 09:12:58

## 2021-09-04 NOTE — Progress Notes (Signed)
[name], [name] ([id number]) Visit Report for 09/03/2021 Problem List Details Patient Name: Date of Service: [name], [name] 09/03/2021 8:00 A M Medical Record Number: [id number] Patient Account Number: [id number] Date of Birth/Sex: Treating RN: 1970-02-15 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 09/03/2021 Medical Record Number: [id number] Patient Account Number: [id number] Date of Birth/Sex: Treating RN: December 29, 1970 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 LO27.741Non-pressure chronic ulcer of other part of right foot with necrosis  of bone M86.671 Other chronic osteomyelitis, right ankle and foot Modifier: Quantity: 4 Physician Procedures : CPT4 Code Description Modifier [id number] 72094- WC PHYS HYPERBARIC OXYGEN THERAPY ICD-10 Diagnosis Description 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

## 2021-09-04 NOTE — Progress Notes (Signed)
[name], [name] ([id number]) Visit Report for 09/04/2021 Problem List Details Patient Name: Date of Service: [name], [name] 09/04/2021 8:00 A M Medical Record Number: [id number] Patient Account Number: [id number] Date of Birth/Sex: Treating RN: 1970-03-29 (51 y.o. 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 09/04/2021 Medical Record Number: [id number] Patient Account Number: [id number] Date of Birth/Sex: Treating RN: Jan 05, 1971 (51 y.o. 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 LA00.459Non-pressure chronic ulcer of other part of right foot with necrosis  of bone M86.671 Other chronic osteomyelitis, right ankle and foot Modifier: Quantity: 4 Physician Procedures : CPT4 Code Description Modifier [id number] 39532- WC PHYS HYPERBARIC OXYGEN THERAPY ICD-10 Diagnosis Description 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

## 2021-09-04 NOTE — Progress Notes (Signed)
[name], [name] ([id number]) Visit Report for 09/04/2021 HBO Details Patient Name: Date of Service: [name], [name] 09/04/2021 8:00 A M Medical Record Number: [id number] Patient Account Number: [id number] Date of Birth/Sex: Treating RN: 1970-07-22 [name] Primary Care Saksham Akkerman: [name] Other Clinician: [name] Referring Yeni Jiggetts: Treating [name]/Extender: [name] in Treatment: 8 HBO Treatment Course Details Treatment Course Number: 1 Ordering Jaciel Diem: [name] T Treatments Ordered: otal 40 HBO Treatment Start Date: 08/14/2021 HBO Indication: Diabetic Ulcer(s) of the Lower Extremity Standard/Conservative Wound Care tried and failed greater than or equal to 30 days Wound #2 Right, Lateral, Plantar Foot , Wound #3R Right, Lateral Foot HBO Treatment Details Treatment Number: 16 Patient Type: Outpatient Chamber Type: Monoplace Chamber Serial #: [id number] Treatment Protocol: 2.0 ATA with 90 minutes oxygen, and no air breaks Treatment Details Compression Rate Down: 2.0 psi / minute De-Compression Rate Up: 2.0 psi / minute Air breaks and breathing Decompress Decompress Compress Tx Pressure Begins Reached periods Begins Ends (leave unused spaces blank) Chamber Pressure (ATA 1 2 ------2 1 ) Clock Time (24 hr) 08:16 08:25 - - - - - - 09:55 10:03 Treatment Length: 107 (minutes) Treatment Segments: 4 Vital Signs Capillary Blood Glucose Reference Range: 80 - 120 mg / dl HBO Diabetic Blood Glucose Intervention Range: <131 mg/dl or >249 mg/dl Time Vitals Blood Respiratory Capillary Blood Glucose Pulse Action Type: Pulse: Temperature: Taken: Pressure: Rate: Glucose (mg/dl): Meter #: Oximetry (%) Taken: Pre 08:09 123/82 99 16 98.4 153 Post 10:10 129/75 65 16 97.2 201 Treatment Response Treatment Toleration: Well Treatment Completion Status: Treatment Completed without Adverse Event Additional  Procedure Documentation Tissue Sevierity: Necrosis of bone Physician HBO Attestation: I certify that I supervised this HBO treatment in accordance with Medicare guidelines. A trained emergency response team is readily available per Yes hospital policies and procedures. Continue HBOT as ordered. Yes Electronic Signature(s) Signed: 09/04/2021 12:40:09 PM By: [name] MD FACS Previous Signature: 09/04/2021 10:53:44 AM Version By: [name] EMT Previous Signature: 09/04/2021 9:09:51 AM Version By: [name] EMT Previous Signature: 09/04/2021 9:09:51 AM Version By: [name] EMT Entered By: [name] on 09/04/2021 12:40:09 -------------------------------------------------------------------------------- HBO Safety Checklist Details Patient Name: Date of Service: [name], [name] 09/04/2021 8:00 A M Medical Record Number: [id number] Patient Account Number: [id number] Date of Birth/Sex: Treating RN: 1970/07/16 [name] Primary Care [name]: [name] Other Clinician: [name] Referring [name]: Treating Jobie Popp/Extender: [name] in Treatment: 8 HBO Safety Checklist Items Safety Checklist Consent Form Signed Patient voided / foley secured and emptied When did you last eato 0710 Last dose of injectable or oral agent 0700 Ostomy pouch emptied and vented if applicable NA All implantable devices assessed, documented and approved PIC line Intravenous access site secured and place PIC Line Valuables secured Linens and cotton and cotton/polyester blend (less than 51% polyester) Personal oil-based products / skin lotions / body lotions removed Wigs or hairpieces removed NA Smoking or tobacco materials removed Books / newspapers / magazines / loose paper removed Cologne, aftershave, perfume and deodorant removed Jewelry removed (may wrap wedding band) NA Make-up removed NA Hair care products  removed Battery operated devices (external) removed Heating patches and chemical warmers removed Titanium eyewear removed NA Nail polish cured greater than 10 hours NA Casting material cured greater than 10 hours NA Hearing aids removed NA Loose dentures or partials removed NA Prosthetics have been removed NA Patient demonstrates correct  use of air break device (if applicable) Patient concerns have been addressed Patient grounding bracelet on and cord attached to chamber Specifics for Inpatients (complete in addition to above) Medication sheet sent with patient NA Intravenous medications needed or due during therapy sent with patient NA Drainage tubes (e.g. nasogastric tube or chest tube secured and vented) NA Endotracheal or Tracheotomy tube secured NA Cuff deflated of air and inflated with saline NA Airway suctioned NA Notes The safety checklist was done before the treatment was started. Electronic Signature(s) Signed: 09/04/2021 9:09:16 AM By: [name] EMT Entered By: [name] on 09/04/2021 09:09:15

## 2021-09-04 NOTE — Progress Notes (Signed)
[name], [name] ([id number]) Visit Report for 09/04/2021 Arrival Information Details Patient Name: Date of Service: [place], [place] [name] 09/04/2021 8:00 A M Medical Record Number: [id number] Patient Account Number: [id number] Date of Birth/Sex: Treating RN: 29-Aug-1970 (51 y.o. [name] Primary Care Imara Standiford: [name] Other Clinician: [name] Referring Bonnie Overdorf: Treating Kensey Luepke/Extender: [name] in Treatment: 8 Visit Information History Since Last Visit All ordered tests and consults were completed: Yes Patient Arrived: Ambulatory Added or deleted any medications: No Arrival Time: 07:51 Any new allergies or adverse reactions: No Accompanied By: None Had a fall or experienced change in No Transfer Assistance: None activities of daily living that may affect Patient Identification Verified: Yes risk of falls: Secondary Verification Process Completed: Yes Signs or symptoms of abuse/neglect since last visito No Patient Requires Transmission-Based Precautions: No Hospitalized since last visit: No Patient Has Alerts: Yes Implantable device outside of the clinic excluding No Patient Alerts: ABI R 1.17 cellular tissue based products placed in the center ABI L 1.29 since last visit: R TBI 1.03 Pain Present Now: No L TBI 0.77 Electronic Signature(s) Signed: 09/04/2021 9:07:18 AM By: [name] EMT Entered By: [name] on 09/04/2021 09:07:18 -------------------------------------------------------------------------------- Encounter Discharge Information Details Patient Name: Date of Service: [place], [name] 09/04/2021 8:00 A M Medical Record Number: [id number] Patient Account Number: [id number] Date of Birth/Sex: Treating RN: [birth year] (51 y.o. [name] Primary Care Ixel Boehning: [name] Other Clinician: [name] Referring Claritza July: Treating Christi Wirick/Extender: [name] in Treatment: 8 Encounter Discharge Information Items Discharge Condition: Stable Ambulatory Status: Ambulatory Discharge Destination: Home Transportation: Private Auto Accompanied By: None Schedule Follow-up Appointment: Yes Clinical Summary of Care: Electronic Signature(s) Signed: 09/04/2021 10:54:44 AM By: [name] EMT Entered By: [name] on 09/04/2021 10:54:44 -------------------------------------------------------------------------------- Vitals Details Patient Name: Date of Service: [name], [name] 09/04/2021 8:00 A M Medical Record Number: [id number] Patient Account Number: [id number] Date of Birth/Sex: Treating RN: [birth year] (51 y.o. [name] Primary Care Melia Hopes: [name] Other Clinician: [name] Referring Adamariz Gillott: Treating [name]/Extender: [name] in Treatment: 8 Vital Signs Time Taken: 08:09 Temperature (F): 98.4 Height (in): 72 Pulse (bpm): 99 Weight (lbs): 312 Respiratory Rate (breaths/min): 16 Body Mass Index (BMI): 42.3 Blood Pressure (mmHg): 123/82 Capillary Blood Glucose (mg/dl): 153 Reference Range: 80 - 120 mg / dl Electronic Signature(s) Signed: 09/04/2021 9:07:49 AM By: [name] EMT Entered By: [name] on 09/04/2021 09:07:48

## 2021-09-05 ENCOUNTER — Encounter: Payer: Self-pay | Admitting: Internal Medicine

## 2021-09-05 ENCOUNTER — Telehealth: Payer: Self-pay

## 2021-09-05 ENCOUNTER — Encounter (HOSPITAL_BASED_OUTPATIENT_CLINIC_OR_DEPARTMENT_OTHER): Payer: BLUE CROSS/BLUE SHIELD | Admitting: General Surgery

## 2021-09-05 ENCOUNTER — Ambulatory Visit: Payer: BLUE CROSS/BLUE SHIELD | Admitting: Internal Medicine

## 2021-09-05 ENCOUNTER — Other Ambulatory Visit: Payer: Self-pay

## 2021-09-05 VITALS — BP 135/81 | HR 70 | Temp 97.6°F | Ht 72.0 in | Wt 312.0 lb

## 2021-09-05 DIAGNOSIS — E11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ical complication: Secondary | ICD-10-CM | POA: Diagnosis not present

## 2021-09-05 DIAGNOSIS — Z95828 Presence of other vascular implants and grafts: Secondary | ICD-10-CM | POA: Diagnosis not present

## 2021-09-05 DIAGNOSIS — M86171 Other acute osteomyelitis, right ankle and foot: Secondary | ICD-10-CM

## 2021-09-05 LAB — GLUCOSE, CAPILLARY
Glucose-Capillary: 163 mg/dL — ABNORMAL HIGH (ref 70–99)
Glucose-Capillary: 140 mg/dL — ABNORMAL HIGH (ref 70–99)

## 2021-09-05 MED ORDER — LEVOFLOXACIN 750 MG PO TABS: 750.0000 mg | ORAL_TABLET | Freq: Every day | ORAL | 0 refills | Status: DC

## 2021-09-05 MED ORDER — LINEZOLID 600 MG PO TABS: 600.0000 mg | ORAL_TABLET | Freq: Two times a day (BID) | ORAL | 0 refills | Status: DC

## 2021-09-05 NOTE — Assessment & Plan Note (Signed)
No issues with current PICC line.  Will remove following course of IV therapy on 09/20/21.

## 2021-09-05 NOTE — Assessment & Plan Note (Signed)
Discussed with patient regarding his extensive infection.  Encouraging that wound is improved with antibiotics and wound care.  Inflammatory markers are still elevated but difficult to interpret with ongoing wound care and hyperbaric treatment.  Discussed difficulty with eradicating this type of infection in the absence of definitive surgery.  Will complete 6 weeks of IV therapy around 09/20/21 and then will place on linezolid '600mg'$  BID and Levaquin '750mg'$  daily x 14 more days for "mop up" therapy.  Will send in prescription today and advised not to start until he is finished with IV treatment.  Will follow up in about 3 weeks and get monitoring labs.

## 2021-09-05 NOTE — Telephone Encounter (Signed)
Thanks

## 2021-09-05 NOTE — Telephone Encounter (Signed)
Sent orders to Carolynn Sayers, RN with [facility] that okay to pull PICC after last dose 9/1 per Dr. Juleen China.   Beryle Flock, RN

## 2021-09-05 NOTE — Progress Notes (Addendum)
[name], [name] ([id number]) Visit Report for 09/05/2021 HBO Details Patient Name: Date of Service: [name], [name] 09/05/2021 8:00 A M Medical Record Number: [id number] Patient Account Number: [id number] Date of Birth/Sex: Treating RN: 1970/09/07 [name] Primary Care Verlia Kaney: [name] Other Clinician: [name] Referring Kymoni Monday: Treating Joshua Zeringue/Extender: [name] in Treatment: 9 HBO Treatment Course Details Treatment Course Number: 1 Ordering [name]: [name] T Treatments Ordered: otal 40 HBO Treatment Start Date: 08/14/2021 HBO Indication: Diabetic Ulcer(s) of the Lower Extremity Standard/Conservative Wound Care tried and failed greater than or equal to 30 days Wound #2 Right, Lateral, Plantar Foot , Wound #3R Right, Lateral Foot HBO Treatment Details Treatment Number: 17 Patient Type: Outpatient Chamber Type: Monoplace Chamber Serial #: [id number] Treatment Protocol: 2.0 ATA with 90 minutes oxygen, and no air breaks Treatment Details Compression Rate Down: 2.0 psi / minute De-Compression Rate Up: 2.0 psi / minute Air breaks and breathing Decompress Decompress Compress Tx Pressure Begins Reached periods Begins Ends (leave unused spaces blank) Chamber Pressure (ATA 1 2 ------2 1 ) Clock Time (24 hr) 08:08 08:16 - - - - - - 09:46 09:54 Treatment Length: 106 (minutes) Treatment Segments: 4 Vital Signs Capillary Blood Glucose Reference Range: 80 - 120 mg / dl HBO Diabetic Blood Glucose Intervention Range: <131 mg/dl or >249 mg/dl Time Vitals Blood Respiratory Capillary Blood Glucose Pulse Action Type: Pulse: Temperature: Taken: Pressure: Rate: Glucose (mg/dl): Meter #: Oximetry (%) Taken: Pre 08:04 133/91 83 16 98.1 163 Post 10:02 123/57 78 16 97.5 140 Treatment Response Treatment Toleration: Well Treatment Completion Status: Treatment Completed without Adverse Event Additional  Procedure Documentation Tissue Sevierity: Necrosis of bone Physician HBO Attestation: I certify that I supervised this HBO treatment in accordance with Medicare guidelines. A trained emergency response team is readily available per Yes hospital policies and procedures. Continue HBOT as ordered. Yes Electronic Signature(s) Signed: 09/05/2021 4:46:24 PM By: [name] MD FACS Previous Signature: 09/05/2021 1:04:56 PM Version By: [name] EMT Previous Signature: 09/05/2021 9:05:29 AM Version By: [name] EMT Previous Signature: 09/05/2021 9:05:29 AM Version By: [name] EMT Entered By: [name] on 09/05/2021 16:46:24 -------------------------------------------------------------------------------- HBO Safety Checklist Details Patient Name: Date of Service: [name], [name] 09/05/2021 8:00 A M Medical Record Number: [id number] Patient Account Number: [id number] Date of Birth/Sex: Treating RN: 1970-02-01 [name] Primary Care [name]: [name] Other Clinician: [name] Referring Shaheem Pichon: Treating Shilo Pauwels/Extender: [name] in Treatment: 9 HBO Safety Checklist Items Safety Checklist Consent Form Signed Patient voided / foley secured and emptied When did you last eato 0700 Last dose of injectable or oral agent 0700 Ostomy pouch emptied and vented if applicable NA All implantable devices assessed, documented and approved NA Intravenous access site secured and place NA Valuables secured Linens and cotton and cotton/polyester blend (less than 51% polyester) Personal oil-based products / skin lotions / body lotions removed Wigs or hairpieces removed NA Smoking or tobacco materials removed Books / newspapers / magazines / loose paper removed Cologne, aftershave, perfume and deodorant removed Jewelry removed (may wrap wedding band) NA Make-up removed NA Hair care products  removed Battery operated devices (external) removed Heating patches and chemical warmers removed Titanium eyewear removed NA Nail polish cured greater than 10 hours NA Casting material cured greater than 10 hours NA Hearing aids removed NA Loose dentures or partials removed NA Prosthetics have been removed NA Patient demonstrates correct use of  air break device (if applicable) Patient concerns have been addressed Patient grounding bracelet on and cord attached to chamber Specifics for Inpatients (complete in addition to above) Medication sheet sent with patient NA Intravenous medications needed or due during therapy sent with patient NA Drainage tubes (e.g. nasogastric tube or chest tube secured and vented) NA Endotracheal or Tracheotomy tube secured NA Cuff deflated of air and inflated with saline NA Airway suctioned NA Notes The safety checklist was done before the treatment was started. Electronic Signature(s) Signed: 09/05/2021 9:03:52 AM By: [name] EMT Entered By: [name] on 09/05/2021 09:03:52

## 2021-09-05 NOTE — Progress Notes (Signed)
[name], [name] ([id number]) Visit Report for 09/05/2021 Problem List Details Patient Name: Date of Service: [name], [name] 09/05/2021 8:00 A M Medical Record Number: [id number] Patient Account Number: [id number] Date of Birth/Sex: Treating RN: 1970-08-17 (51 y.o. 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 09/05/2021 Medical Record Number: [id number] Patient Account Number: [id number] Date of Birth/Sex: Treating RN: 03/14/1970 (51 y.o. 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 LC37.628Non-pressure chronic ulcer of other part of right foot with necrosis  of bone M86.671 Other chronic osteomyelitis, right ankle and foot Modifier: Quantity: 4 Physician Procedures : CPT4 Code Description Modifier [id number] PHYS HYPERBARIC OXYGEN THERAPY ICD-10 Diagnosis Description 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

## 2021-09-05 NOTE — Patient Instructions (Signed)
Thank you for coming to see me today. It was a pleasure seeing you.  To Do: Continue follow up with wound care Continue IV antibiotics through 09/20/21 then PICC line will be removed Start oral linezolid and levofloxacin for 2 weeks following PICC line removal Follow up in 3 weeks I sent prescription today for the oral antibiotics for you to pick up and have on hand.   If you have any questions or concerns, please do not hesitate to call the office at [phone].  Take Care,   Jule Ser

## 2021-09-05 NOTE — Progress Notes (Signed)
[facility]  CHIEF COMPLAINT:    Follow up for right foot OM  SUBJECTIVE:    [name] is a 51 y.o. male with PMHx as below who presents to the clinic for right foot OM.   Patient here today for routine follow up.  He was seen on 7/18 for chronic diabetic foot ulcer complicated by worsened osteomyelitis and recent MRI on 07/18/21 showing fairly extensive involvement of his foot.  We discussed limb salvage vs amputation and he wanted to do what he could to save his limb.  PICC line placed 08/09/21 and started on Cefepime and Daptomycin per pharmacy recs through 09/20/21 (6 weeks).  Baseline ESR = 58 and CRP 52.5. He has continued to follow closely with wound care. Notes from 8/7 appear to show that his wounds are improving.  OPAT reviewed from 8/9 with ESR 81, CRP 9, and CK 209.  He just came from wound care today and is doing well.  No issues with his PICC line or tolerating antibiotics.   Please see A&P for the details of today's visit and status of the patient's medical problems.   Patient's Medications  New Prescriptions   LEVOFLOXACIN (LEVAQUIN) 750 MG TABLET    Take 1 tablet (750 mg total) by mouth daily for 14 days.   LINEZOLID (ZYVOX) 600 MG TABLET    Take 1 tablet (600 mg total) by mouth 2 (two) times daily for 14 days.  Previous Medications   ASCORBIC ACID (VITAMIN C) 500 MG TABLET    Take 500 mg by mouth daily.   ASPIRIN EC 81 MG TABLET    Take 81 mg by mouth daily.   ATORVASTATIN (LIPITOR) 80 MG TABLET    Take 1 tablet (80 mg total) by mouth daily.   CYANOCOBALAMIN (VITAMIN B-12) 5000 MCG SUBL    Place 5,000 mcg under the tongue daily.   FENOFIBRATE (TRICOR) 48 MG TABLET    TAKE 1 TABLET BY MOUTH EVERY DAY   GLUCOSE BLOOD (ACCU-CHEK GUIDE) TEST STRIP    Dx E11.49 - Use to check blood glucose three times daily or as directed.   HYDROCHLOROTHIAZIDE (MICROZIDE) 12.5 MG CAPSULE    TAKE 1 CAPSULE BY MOUTH EVERY DAY IN THE MORNING    INSULIN ASPART (NOVOLOG) 100 UNIT/ML FLEXPEN    Inject 15-25 Units into the skin 3 (three) times daily with meals. Please provide pen needles if possible   INSULIN DEGLUDEC (TRESIBA FLEXTOUCH) 200 UNIT/ML FLEXTOUCH PEN    Inject 80-90 Units into the skin daily.   INSULIN SYRINGE-NEEDLE U-100 (BD INSULIN SYRINGE U/F) 31G X 5/16" 0.5 ML MISC    Use to inject insulin 4 times daily. DX:E11.9   INSULIN SYRINGE-NEEDLE U-100 (BD INSULIN SYRINGE U/F) 31G X 5/16" 1 ML MISC    Use to inject tresiba/long acting insulin once daily   INSULIN SYRINGE-NEEDLE U-100 (BD VEO INSULIN SYR U/F 1/2UNIT) 31G X 15/64" 0.3 ML MISC    USE TO INJECT INSULIN FOUR TIMES DAILY AS DIRECTED   LANCETS (ONETOUCH ULTRASOFT) LANCETS    Use to test blood sugars daily. Dx: E11.9   LANCETS MISC    USED TO CHECK BLOOD GLUCOSE 3-4 TIMES DAILY   MULTIPLE VITAMIN (MULTIVITAMIN WITH MINERALS) TABS TABLET    Take 1 tablet by mouth 2 (two) times daily.   OMEGA-3 FATTY ACIDS (FISH OIL) 1360 MG CAPS    Take 1,360 mg by mouth daily.    PYRIDOXINE (  B-6) 100 MG TABLET    Take 100 mg by mouth 2 (two) times daily.   RAMIPRIL (ALTACE) 5 MG CAPSULE    TAKE 1 CAPSULE BY MOUTH EVERY DAY IN THE MORNING   SITAGLIPTIN (JANUVIA) 50 MG TABLET    Take 1 tablet (50 mg total) by mouth daily.  Modified Medications   No medications on file  Discontinued Medications   No medications on file      Past Medical History:  Diagnosis Date   Cellulitis and abscess of foot    05/ 2021 left   CKD (chronic kidney disease), stage III (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[place])    11-06-2016  lower extremity angiography (dr [name])  mil tibial disease w/ occlusion of the distal peroneal artery and dorsalis pedis artery but has widely patent posterior tibial and anterior tibial arterties   Type 2 diabetes mellitus treated with insulin ([place])    followed by pcp   (06-17-2019  pt stated checks blood sugar daily in am,  fasting sugar-- average 71)   Vitamin B 12 deficiency     Social History   Tobacco Use   Smoking status: Never   Smokeless tobacco: Never  Vaping Use   Vaping Use: Never used  Substance Use Topics   Alcohol use: No   Drug use: Never    Family History  Problem Relation Age of Onset   Sarcoidosis Mother    Stroke Father        early 29s. former smoker   Diabetes Father    Hypertension Father    Hypertension Sister    Diabetes Paternal Grandmother    Congenital heart disease Paternal Grandmother        died of complications   Healthy Daughter    Colon cancer Neg Hx    Colon polyps Neg Hx    Esophageal cancer Neg Hx    Rectal cancer Neg Hx    Stomach cancer Neg Hx     No Known Allergies  Review of Systems  All other systems reviewed and are negative.  Except as noted above.   OBJECTIVE:    Vitals:   09/05/21 1035  BP: 135/81  Pulse: 70  Temp: 97.6 F (36.4 C)  TempSrc: Oral  SpO2: 100%  Weight: (!) 312 lb (141.5 kg)  Height: 6' (1.829 m)   Body mass index is 42.31 kg/m.  Physical Exam Constitutional:      Appearance: Normal appearance.  Musculoskeletal:     Comments: PICC in place. Right lateral wound healed.  Plantar wound packed with no warmth, erythema, drainage.  Appears better.   Neurological:     General: No focal deficit present.     Mental Status: He is alert and oriented to person, place, and time.  Psychiatric:        Mood and Affect: Mood normal.  Behavior: Behavior normal.      Labs and Microbiology:    Latest Ref Rng & Units 08/06/2021    4:02 PM 05/15/2021    9:22 AM 10/16/2020   11:33 AM  CBC  WBC 3.8 - 10.8 Thousand/uL 9.4  9.4  7.6   Hemoglobin 13.2 - 17.1 g/dL 12.1  12.1  13.6   Hematocrit 38.5 - 50.0 % 38.5  36.7  42.5   Platelets 140 - 400 Thousand/uL 417  377.0  296       Latest Ref Rng & Units 08/06/2021    4:02 PM 05/15/2021    9:22 AM 11/22/2020    8:37 AM  CMP  Glucose 65 - 99 mg/dL 211  69  77   BUN 7 - 25 mg/dL 22  22  21    Creatinine 0.70 - 1.30 mg/dL 1.31  1.37  1.53   Sodium 135 - 146 mmol/L 136  140  139   Potassium 3.5 - 5.3 mmol/L 4.1  4.5  3.9   Chloride 98 - 110 mmol/L 101  103  104   CO2 20 - 32 mmol/L 26  30  29    Calcium 8.6 - 10.3 mg/dL 9.6  9.8  9.5   Total Protein 6.1 - 8.1 g/dL 8.2  8.3  7.8   Total Bilirubin 0.2 - 1.2 mg/dL 0.5  0.7  0.9   Alkaline Phos 39 - 117 U/L  84  64   AST 10 - 35 U/L 24  18  23    ALT 9 - 46 U/L 42  24  30        ASSESSMENT & PLAN:    Osteomyelitis of ankle or foot, acute, right ([place]) Discussed with patient regarding his extensive infection.  Encouraging that wound is improved with antibiotics and wound care.  Inflammatory markers are still elevated but difficult to interpret with ongoing wound care and hyperbaric treatment.  Discussed difficulty with eradicating this type of infection in the absence of definitive surgery.  Will complete 6 weeks of IV therapy around 09/20/21 and then will place on linezolid 661m BID and Levaquin 7585mdaily x 14 more days for "mop up" therapy.  Will send in prescription today and advised not to start until he is finished with IV treatment.  Will follow up in about 3 weeks and get monitoring labs.      AnRaynelle Highlandor [facility] 09/05/2021, 10:57 AM   I have personally spent 30 minutes involved in face-to-face and non-face-to-face activities for this patient on the day of  the visit. Professional time spent includes the following activities: Preparing to see the patient (review of tests), Obtaining and/or reviewing separately obtained history (admission/discharge record), Performing a medically appropriate examination and/or evaluation , Ordering medications/tests/procedures, referring and communicating with other health care professionals, Documenting clinical information in the EMR, Independently interpreting results (not separately reported), Communicating results to the patient/family/caregiver, Counseling and educating the patient/family/caregiver and Care coordination (not separately reported).

## 2021-09-05 NOTE — Progress Notes (Addendum)
[name], Saman M. ([id number]) Visit Report for 09/05/2021 Arrival Information Details Patient Name: Date of Service: [place], Grant [name] 09/05/2021 8:00 A M Medical Record Number: [id number] Patient Account Number: [id number] Date of Birth/Sex: Treating RN: 1970/07/16 (51 y.o. [name] Primary Care Jeffie Widdowson: [name] Other Clinician: [name] Referring Daris Aristizabal: Treating Davied Nocito/Extender: [name] in Treatment: 9 Visit Information History Since Last Visit All ordered tests and consults were completed: Yes Patient Arrived: Ambulatory Added or deleted any medications: No Arrival Time: 07:45 Any new allergies or adverse reactions: No Accompanied By: None Had a fall or experienced change in No Transfer Assistance: None activities of daily living that may affect Patient Identification Verified: Yes risk of falls: Secondary Verification Process Completed: Yes Signs or symptoms of abuse/neglect since last visito No Patient Requires Transmission-Based Precautions: No Hospitalized since last visit: No Patient Has Alerts: Yes Implantable device outside of the clinic excluding No Patient Alerts: ABI R 1.17 cellular tissue based products placed in the center ABI L 1.29 since last visit: R TBI 1.03 Pain Present Now: No L TBI 0.77 Electronic Signature(s) Signed: 09/05/2021 9:02:11 AM By: [name] EMT Entered By: [name] on 09/05/2021 09:02:11 -------------------------------------------------------------------------------- Encounter Discharge Information Details Patient Name: Date of Service: [place], [name] 09/05/2021 8:00 A M Medical Record Number: [id number] Patient Account Number: [id number] Date of Birth/Sex: Treating RN: January 18, 1971 (51 y.o. [name] Primary Care Darryle Dennie: [name] Other Clinician: [name] Referring Eziah Negro: Treating Meral Geissinger/Extender: [name] in Treatment: 9 Encounter Discharge Information Items Discharge Condition: Stable Ambulatory Status: Ambulatory Discharge Destination: Home Transportation: Private Auto Accompanied By: None Schedule Follow-up Appointment: Yes Clinical Summary of Care: Electronic Signature(s) Signed: 09/05/2021 1:06:16 PM By: [name] EMT Entered By: [name] on 09/05/2021 13:06:16 -------------------------------------------------------------------------------- Vitals Details Patient Name: Date of Service: [name], [name] 09/05/2021 8:00 A M Medical Record Number: [id number] Patient Account Number: [id number] Date of Birth/Sex: Treating RN: [birth year] (51 y.o. [name] Primary Care Mackenna Kamer: [name] Other Clinician: [name] Referring Jkai Arwood: Treating Javone Ybanez/Extender: [name] in Treatment: 9 Vital Signs Time Taken: 08:04 Temperature (F): 98.1 Height (in): 72 Pulse (bpm): 83 Weight (lbs): 312 Respiratory Rate (breaths/min): 16 Body Mass Index (BMI): 42.3 Blood Pressure (mmHg): 133/91 Capillary Blood Glucose (mg/dl): 163 Reference Range: 80 - 120 mg / dl Electronic Signature(s) Signed: 09/05/2021 9:02:41 AM By: [name] EMT Entered By: [name] on 09/05/2021 09:02:41

## 2021-09-06 ENCOUNTER — Encounter (HOSPITAL_BASED_OUTPATIENT_CLINIC_OR_DEPARTMENT_OTHER): Payer: BLUE CROSS/BLUE SHIELD | Admitting: Internal Medicine

## 2021-09-06 DIAGNOSIS — M86671 Other chronic osteomyelitis, right ankle and foot: Secondary | ICD-10-CM | POA: Diagnosis not present

## 2021-09-06 DIAGNOSIS — L97514 Non-pressure chronic ulcer of other part of right foot with necrosis of bone: Secondary | ICD-10-CM

## 2021-09-06 DIAGNOSIS — E11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ical complication: Secondary | ICD-10-CM | POA: Diagnosis not present

## 2021-09-06 LAB — GLUCOSE, CAPILLARY
Glucose-Capillary: 136 mg/dL — ABNORMAL HIGH (ref 70–99)
Glucose-Capillary: 118 mg/dL — ABNORMAL HIGH (ref 70–99)

## 2021-09-06 NOTE — Progress Notes (Signed)
[name], Lopez M. ([id number]) Visit Report for 09/03/2021 Arrival Information Details Patient Name: Date of Service: [place], Grand [name] 09/03/2021 8:00 A M Medical Record Number: [id number] Patient Account Number: [id number] Date of Birth/Sex: Treating RN: 09/29/1970 (51 y.o. [name] Primary Care Suraj Ramdass: [name] Other Clinician: [name] Referring Mikiyah Glasner: Treating Hyde Sires/Extender: [name] in Treatment: 8 Visit Information History Since Last Visit All ordered tests and consults were completed: Yes Patient Arrived: Ambulatory Added or deleted any medications: No Arrival Time: 07:36 Any new allergies or adverse reactions: No Accompanied By: None Had a fall or experienced change in No Transfer Assistance: None activities of daily living that may affect Patient Identification Verified: Yes risk of falls: Secondary Verification Process Completed: Yes Signs or symptoms of abuse/neglect since last visito No Patient Requires Transmission-Based Precautions: No Hospitalized since last visit: No Patient Has Alerts: Yes Implantable device outside of the clinic excluding No Patient Alerts: ABI R 1.17 cellular tissue based products placed in the center ABI L 1.29 since last visit: R TBI 1.03 Pain Present Now: No L TBI 0.77 Electronic Signature(s) Signed: 09/03/2021 9:10:48 AM By: [name] EMT Entered By: [name] on 09/03/2021 09:10:47 -------------------------------------------------------------------------------- Encounter Discharge Information Details Patient Name: Date of Service: [place], [name] 09/03/2021 8:00 A M Medical Record Number: [id number] Patient Account Number: [id number] Date of Birth/Sex: Treating RN: [birth year] (51 y.o. [name] Primary Care Paolina Karwowski: [name] Other Clinician: [name] Referring Camile Esters: Treating Anniston Nellums/Extender: [name] in Treatment: 8 Encounter Discharge Information Items Discharge Condition: Stable Ambulatory Status: Ambulatory Discharge Destination: Home Transportation: Private Auto Accompanied By: None Schedule Follow-up Appointment: Yes Clinical Summary of Care: Electronic Signature(s) Signed: 09/03/2021 10:13:17 AM By: [name] EMT Entered By: [name] on 09/03/2021 10:13:17 -------------------------------------------------------------------------------- Vitals Details Patient Name: Date of Service: [name], [name] 09/03/2021 8:00 A M Medical Record Number: [id number] Patient Account Number: [id number] Date of Birth/Sex: Treating RN: Jun 28, 1970 (51 y.o. [name] Primary Care Doris Gruhn: [name] Other Clinician: [name] Referring Yolander Goodie: Treating Danylah Holden/Extender: [name] in Treatment: 8 Vital Signs Time Taken: 08:06 Temperature (F): 97.9 Height (in): 72 Pulse (bpm): 97 Weight (lbs): 312 Respiratory Rate (breaths/min): 16 Body Mass Index (BMI): 42.3 Blood Pressure (mmHg): 125/75 Capillary Blood Glucose (mg/dl): 182 Reference Range: 80 - 120 mg / dl Electronic Signature(s) Signed: 09/03/2021 9:11:18 AM By: [name] EMT Entered By: [name] on 09/03/2021 09:11:18

## 2021-09-06 NOTE — Progress Notes (Signed)
[name], [name] ([id number]) Visit Report for 09/02/2021 Arrival Information Details Patient Name: Date of Service: [name] Trough, [name] 09/02/2021 10:30 A M Medical Record Number: [id number] Patient Account Number: [id number] Date of Birth/Sex: Treating RN: [birth year] [name] Primary Care [name]: [name] Other Clinician: Referring [name]: Treating [name]/Extender: [name] in Treatment: 8 Visit Information History Since Last Visit Added or deleted any medications: No Patient Arrived: Ambulatory Any new allergies or adverse reactions: No Arrival Time: 10:14 Had a fall or experienced change in No Accompanied By: self activities of daily living that may affect Transfer Assistance: None risk of falls: Patient Identification Verified: Yes Signs or symptoms of abuse/neglect since last visito No Secondary Verification Process Completed: Yes Hospitalized since last visit: No Patient Requires Transmission-Based Precautions: No Implantable device outside of the clinic excluding No Patient Has Alerts: Yes cellular tissue based products placed in the center Patient Alerts: ABI R 1.17 since last visit: ABI L 1.29 Has Dressing in Place as Prescribed: Yes R TBI 1.03 Has Footwear/Offloading in Place as Prescribed: No L TBI 0.77 Pain Present Now: Yes Electronic Signature(s) Signed: 09/02/2021 4:52:29 PM By: [name] Gouty RN, BSN Entered By: [name] on 09/02/2021 10:16:48 -------------------------------------------------------------------------------- Encounter Discharge Information Details Patient Name: Date of Service: [name], [name] 09/02/2021 10:30 A M Medical Record Number: [id number] Patient Account Number: [id number] Date of Birth/Sex: Treating RN: 1970-11-14 [name] Primary Care [name]: [name] Other Clinician: Referring Rhylan Gross: Treating Muscab Brenneman/Extender: [name] in Treatment: 8 Encounter Discharge Information Items Post Procedure Vitals Discharge Condition: Stable Temperature (F): 97.3 Ambulatory Status: Ambulatory Pulse (bpm): 82 Discharge Destination: Home Respiratory Rate (breaths/min): 18 Transportation: Private Auto Blood Pressure (mmHg): 136/84 Accompanied By: self Schedule Follow-up Appointment: Yes Clinical Summary of Care: Patient Declined Electronic Signature(s) Signed: 09/02/2021 4:52:29 PM By: [name] Gouty RN, BSN Entered By: [name] on 09/02/2021 10:50:06 -------------------------------------------------------------------------------- Lower Extremity Assessment Details Patient Name: Date of Service: [name] [place], [name] 09/02/2021 10:30 A M Medical Record Number: [id number] Patient Account Number: [id number] Date of Birth/Sex: Treating RN: 1970/03/01 [name] Primary Care [name]: [name] Other Clinician: Referring Carsten Carstarphen: Treating Janiqua Friscia/Extender: [name] in Treatment: 8 Edema Assessment Assessed: [Left: No] [Right: No] Edema: [Left: N] [Right: o] Calf Left: Right: Point of Measurement: From Medial Instep 40.5 cm Ankle Left: Right: Point of Measurement: From Medial Instep 24 cm Vascular Assessment Pulses: Dorsalis Pedis Palpable: [Right:Yes] Electronic Signature(s) Signed: 09/02/2021 4:52:29 PM By: [name] Gouty RN, BSN Entered By: [name] on 09/02/2021 10:18:40 -------------------------------------------------------------------------------- Multi Wound Chart Details Patient Name: Date of Service: [name] [place], [name] 09/02/2021 10:30 A M Medical Record Number: [id number] Patient Account Number: [id number] Date of Birth/Sex: Treating RN: [birth year] [name] Primary Care [name]: [name] Other Clinician: Referring Kerensa Nicklas: Treating Terrace Chiem/Extender: [name] in Treatment: 8 Photos: [N/A:N/A] Right, Lateral, Plantar Foot N/A N/A Wound Location: Gradually Appeared N/A N/A Wounding Event: Diabetic Wound/Ulcer of the Lower N/A N/A Primary Etiology: Extremity Type II Diabetes N/A N/A Comorbid History: 12/19/2016 N/A N/A Date Acquired: 8 N/A N/A Weeks of Treatment: Open N/A N/A Wound Status: No N/A N/A Wound Recurrence: 0.3x1.9x2.9 N/A N/A Measurements L x W x D (cm) 0.448 N/A N/A A (cm) : rea 1.298 N/A N/A Volume (cm) : 87.60% N/A N/A % Reduction in A rea: 92.80% N/A N/A % Reduction in  Volume: 3 Starting Position 1 (o'clock): 9 Ending Position 1 (o'clock): 0.8 Maximum Distance 1 (cm): Yes N/A N/A Undermining: Grade 3 N/A N/A Classification: Medium N/A N/A Exudate A mount: Serosanguineous N/A N/A Exudate Type: red, brown N/A N/A Exudate Color: Thickened N/A N/A Wound Margin: Large (67-100%) N/A N/A Granulation A mount: Red N/A N/A Granulation Quality: Small (1-33%) N/A N/A Necrotic A mount: Fat Layer (Subcutaneous Tissue): Yes N/A N/A Exposed Structures: Fascia: No Tendon: No Muscle: No Joint: No Bone: No Small (1-33%) N/A N/A Epithelialization: Debridement - Selective/Open Wound N/A N/A Debridement: Pre-procedure Verification/Time Out 10:30 N/A N/A Taken: Lidocaine 4% Topical Solution N/A N/A Pain Control: Callus N/A N/A Tissue Debrided: Skin/Epidermis N/A N/A Level: 4 N/A N/A Debridement A (sq cm): rea Curette N/A N/A Instrument: Minimum N/A N/A Bleeding: Pressure N/A N/A Hemostasis A chieved: 0 N/A N/A Procedural Pain: 0 N/A N/A Post Procedural Pain: Procedure was tolerated well N/A N/A Debridement Treatment Response: 0.4x2x2.9 N/A N/A Post Debridement Measurements L x W x D (cm) 1.822 N/A N/A Post Debridement Volume: (cm) Debridement N/A N/A Procedures Performed: Treatment Notes Electronic Signature(s) Signed: 09/02/2021 10:39:55 AM By:  [name] Maudlin MD FACS Signed: 09/02/2021 4:52:29 PM By: [name] Gouty RN, BSN Entered By: [name] on 09/02/2021 10:39:55 -------------------------------------------------------------------------------- Multi-Disciplinary Care Plan Details Patient Name: Date of Service: [name] IN, [name] 09/02/2021 10:30 A M Medical Record Number: [id number] Patient Account Number: [id number] Date of Birth/Sex: Treating RN: 03-19-1970 [name] Primary Care Lovel Suazo: [name] Other Clinician: Referring Pawan Knechtel: Treating Imani Sherrin/Extender: [name] in Treatment: 8 Multidisciplinary Care Plan reviewed with physician Active Inactive Nutrition Nursing Diagnoses: Impaired glucose control: actual or potential Potential for alteratiion in Nutrition/Potential for imbalanced nutrition Goals: Patient/caregiver will maintain therapeutic glucose control Date Initiated: 07/29/2021 Target Resolution Date: 11/16/2021 Goal Status: Active Interventions: Assess patient nutrition upon admission and as needed per policy Provide education on elevated blood sugars and impact on wound healing Treatment Activities: Patient referred to Primary Care Physician for further nutritional evaluation : 07/29/2021 Notes: Osteomyelitis Nursing Diagnoses: Infection: osteomyelitis Knowledge deficit related to disease process and management Goals: Patient/caregiver will verbalize understanding of disease process and disease management Date Initiated: 07/29/2021 Target Resolution Date: 11/16/2021 Goal Status: Active Patient's osteomyelitis will resolve Date Initiated: 07/29/2021 Target Resolution Date: 11/16/2021 Goal Status: Active Interventions: Assess for signs and symptoms of osteomyelitis resolution every visit Provide education on osteomyelitis Treatment Activities: Consult for HBO : 07/29/2021 Notes: Wound/Skin Impairment Nursing Diagnoses: Impaired  tissue integrity Goals: Patient/caregiver will verbalize understanding of skin care regimen Date Initiated: 07/04/2021 Target Resolution Date: 11/16/2021 Goal Status: Active Interventions: Assess ulceration(s) every visit Treatment Activities: Skin care regimen initiated : 07/04/2021 Notes: Electronic Signature(s) Signed: 09/02/2021 4:52:29 PM By: [name] Gouty RN, BSN Entered By: [name] on 09/02/2021 10:26:37 -------------------------------------------------------------------------------- Pain Assessment Details Patient Name: Date of Service: [name] [place], [name] 09/02/2021 10:30 A M Medical Record Number: [id number] Patient Account Number: [id number] Date of Birth/Sex: Treating RN: 1970-02-15 [name] Primary Care Mikhayla Phillis: [name] Other Clinician: Referring Biruk Troia: Treating Markeem Noreen/Extender: [name] in Treatment: 8 Active Problems Location of Pain Severity and Description of Pain Patient Has Paino Yes Patient Has Paino Yes Site Locations Pain Location: Pain in Ulcers With Dressing Change: Yes Duration of the Pain. Constant / Intermittento Intermittent Rate the pain. Current Pain Level: 3 Worst Pain Level: 4 Least Pain Level: 0 Character of Pain Describe the Pain: Tender Pain Management  and Medication Current Pain Management: Rest: Yes Is the Current Pain Management Adequate: Adequate How does your wound impact your activities of daily livingo Sleep: No Bathing: No Appetite: No Relationship With Others: No Bladder Continence: No Emotions: No Bowel Continence: No Work: No Toileting: No Drive: No Dressing: No Hobbies: No Electronic Signature(s) Signed: 09/02/2021 4:52:29 PM By: [name] Gouty RN, BSN Entered By: [name] on 09/02/2021 10:17:58 -------------------------------------------------------------------------------- Patient/Caregiver Education Details Patient Name: Date of  Service: Horald Chestnut, [name] 8/14/2023andnbsp10:30 A M Medical Record Number: [id number] Patient Account Number: [id number] Date of Birth/Gender: Treating RN: 1970/06/15 (52 y.o. [name] Primary Care Physician: [name] Other Clinician: Referring Physician: Treating Physician/Extender: [name] in Treatment: 8 Education Assessment Education Provided To: Patient Education Topics Provided Elevated Blood Sugar/ Impact on Healing: Methods: Explain/Verbal Responses: Reinforcements needed, State content correctly Hyperbaric Oxygenation: Methods: Explain/Verbal Responses: Reinforcements needed, State content correctly Offloading: Methods: Explain/Verbal Responses: Reinforcements needed, State content correctly Wound/Skin Impairment: Methods: Explain/Verbal Responses: Reinforcements needed, State content correctly Electronic Signature(s) Signed: 09/02/2021 4:52:29 PM By: [name] Gouty RN, BSN Entered By: [name] on 09/02/2021 10:27:09 -------------------------------------------------------------------------------- Wound Assessment Details Patient Name: Date of Service: [name] IN, [name] 09/02/2021 10:30 A M Medical Record Number: [id number] Patient Account Number: [id number] Date of Birth/Sex: Treating RN: Sep 03, 1970 [name] Primary Care Aneesah Hernan: [name] Other Clinician: Referring Raelle Chambers: Treating Kolin Erdahl/Extender: [name] in Treatment: 8 Wound Status Wound Number: 2 Primary Etiology: Diabetic Wound/Ulcer of the Lower Extremity Wound Location: Right, Lateral, Plantar Foot Wound Status: Open Wounding Event: Gradually Appeared Comorbid History: Type II Diabetes Date Acquired: 12/19/2016 Weeks Of Treatment: 8 Clustered Wound: No Photos Wound Measurements Length: (cm) 0.3 Width: (cm) 1.9 Depth: (cm) 2.9 Area: (cm) 0.448 Volume: (cm) 1.298 % Reduction  in Area: 87.6% % Reduction in Volume: 92.8% Epithelialization: Small (1-33%) Tunneling: No Undermining: Yes Starting Position (o'clock): 3 Ending Position (o'clock): 9 Maximum Distance: (cm) 0.8 Wound Description Classification: Grade 3 Wound Margin: Thickened Exudate Amount: Medium Exudate Type: Serosanguineous Exudate Color: red, brown Foul Odor After Cleansing: No Slough/Fibrino No Wound Bed Granulation Amount: Large (67-100%) Exposed Structure Granulation Quality: Red Fascia Exposed: No Necrotic Amount: Small (1-33%) Fat Layer (Subcutaneous Tissue) Exposed: Yes Necrotic Quality: Adherent Slough Tendon Exposed: No Muscle Exposed: No Joint Exposed: No Bone Exposed: No Treatment Notes Wound #2 (Foot) Wound Laterality: Plantar, Right, Lateral Cleanser Soap and Water Discharge Instruction: May shower and wash wound with dial antibacterial soap and water prior to dressing change. Wound Cleanser Discharge Instruction: Cleanse the wound with wound cleanser prior to applying a clean dressing using gauze sponges, not tissue or cotton balls. Peri-Wound Care cotton tipped applicators Topical Gentamicin Discharge Instruction: As directed by physician Primary Dressing KerraCel Ag Gelling Fiber Dressing, 4x5 in (silver alginate) Discharge Instruction: Apply silver alginate to wound bed as instructed Secondary Dressing Secured With Paper Tape, 2x10 (in/yd) Discharge Instruction: Secure dressing with tape as directed. Compression Wrap Kerlix Roll 4.5x3.1 (in/yd) Discharge Instruction: Apply Kerlix and Coban compression as directed. Compression Stockings Add-Ons Electronic Signature(s) Signed: 09/02/2021 4:52:29 PM By: [name] Gouty RN, BSN Entered By: [name] on 09/02/2021 10:25:28 -------------------------------------------------------------------------------- Vitals Details Patient Name: Date of Service: [name] IN, [name] 09/02/2021 10:30 A M Medical Record  Number: [id number] Patient Account Number: [id number] Date of Birth/Sex: Treating RN: 13-Nov-1970 [name] Primary Care Maleiah Dula: [name] Other Clinician: Referring Ron Junco: Treating Raynee Mccasland/Extender: [name]  in Treatment: 8 Vital Signs Time Taken: 10:15 Reference Range: 80 - 120 mg / dl Height (in): 72 Weight (lbs): 312 Body Mass Index (BMI): 42.3 Notes taken post HBOT Electronic Signature(s) Signed: 09/02/2021 4:52:29 PM By: [name] Gouty RN, BSN Entered By: [name] on 09/02/2021 10:17:12

## 2021-09-06 NOTE — Progress Notes (Signed)
[name], [name] ([id number]) Visit Report for 09/02/2021 Arrival Information Details Patient Name: Date of Service: [place], [place] [name] 09/02/2021 8:00 A M Medical Record Number: [id number] Patient Account Number: [id number] Date of Birth/Sex: Treating RN: [birth year] [name], [name] Primary Care Alysiana Ethridge: [name] Other Clinician: [name] Referring Myliyah Rebuck: Treating Chanteria Haggard/Extender: [name] in Treatment: 8 Visit Information History Since Last Visit All ordered tests and consults were completed: Yes Patient Arrived: Ambulatory Added or deleted any medications: No Arrival Time: 07:40 Any new allergies or adverse reactions: No Accompanied By: None Had a fall or experienced change in No Transfer Assistance: None activities of daily living that may affect Patient Identification Verified: Yes risk of falls: Secondary Verification Process Completed: Yes Signs or symptoms of abuse/neglect since last visito No Patient Requires Transmission-Based Precautions: No Hospitalized since last visit: No Patient Has Alerts: Yes Implantable device outside of the clinic excluding No Patient Alerts: ABI R 1.17 cellular tissue based products placed in the center ABI L 1.29 since last visit: R TBI 1.03 Pain Present Now: Yes L TBI 0.77 Notes Pain; 3 out of 10 Electronic Signature(s) Signed: 09/02/2021 8:53:15 AM By: [name] EMT Entered By: [name] on 09/02/2021 08:53:15 -------------------------------------------------------------------------------- Encounter Discharge Information Details Patient Name: Date of Service: [name], [name] 09/02/2021 8:00 A M Medical Record Number: [id number] Patient Account Number: [id number] Date of Birth/Sex: Treating RN: 10/08/1970 [name] Primary Care Daulton Harbaugh: [name] Other Clinician: [name] Referring Alyson Ki: Treating Casandra Dallaire/Extender: [name] in Treatment: 8 Encounter Discharge Information Items Discharge Condition: Stable Ambulatory Status: Ambulatory Discharge Destination: Home Transportation: Private Auto Accompanied By: None Schedule Follow-up Appointment: Yes Clinical Summary of Care: Electronic Signature(s) Signed: 09/02/2021 10:19:31 AM By: [name] EMT Entered By: [name] on 09/02/2021 10:19:31 -------------------------------------------------------------------------------- Vitals Details Patient Name: Date of Service: [name], [name] 09/02/2021 8:00 A M Medical Record Number: [id number] Patient Account Number: [id number] Date of Birth/Sex: Treating RN: 20-Jun-1970 [name], [name] Primary Care Kaizlee Carlino: [name] Other Clinician: [name] Referring [name]: Treating Mahamadou Weltz/Extender: [name] in Treatment: 8 Vital Signs Time Taken: 08:01 Temperature (F): 98.8 Height (in): 72 Pulse (bpm): 102 Weight (lbs): 312 Respiratory Rate (breaths/min): 16 Body Mass Index (BMI): 42.3 Blood Pressure (mmHg): 138/88 Capillary Blood Glucose (mg/dl): 191 Reference Range: 80 - 120 mg / dl Electronic Signature(s) Signed: 09/02/2021 8:53:55 AM By: [name] EMT Entered By: [name] on 09/02/2021 08:53:55

## 2021-09-06 NOTE — Progress Notes (Signed)
[name], [name] ([id number]) Visit Report for 09/06/2021 Arrival Information Details Patient Name: Date of Service: [name], [name] 09/06/2021 8:00 A M Medical Record Number: [id number] Patient Account Number: [id number] Date of Birth/Sex: Treating RN: 04-15-1970 (51 y.o. [name] Primary Care Ashvin Adelson: [name] Other Clinician: [name] Referring Mari Battaglia: Treating Icelynn Onken/Extender: [name] in Treatment: 9 Visit Information History Since Last Visit All ordered tests and consults were completed: Yes Patient Arrived: Ambulatory Added or deleted any medications: No Arrival Time: 07:47 Any new allergies or adverse reactions: No Accompanied By: None Had a fall or experienced change in No Transfer Assistance: None activities of daily living that may affect Patient Identification Verified: Yes risk of falls: Secondary Verification Process Completed: Yes Signs or symptoms of abuse/neglect since last visito No Patient Requires Transmission-Based Precautions: No Hospitalized since last visit: No Patient Has Alerts: Yes Implantable device outside of the clinic excluding No Patient Alerts: ABI R 1.17 cellular tissue based products placed in the center ABI L 1.29 since last visit: R TBI 1.03 Pain Present Now: No L TBI 0.77 Electronic Signature(s) Signed: 09/06/2021 8:42:28 AM By: [name] EMT Entered By: [name] on 09/06/2021 08:42:27 -------------------------------------------------------------------------------- Encounter Discharge Information Details Patient Name: Date of Service: [name], [name] 09/06/2021 8:00 A M Medical Record Number: [id number] Patient Account Number: [id number] Date of Birth/Sex: Treating RN: 09/06/1970 (51 y.o. [name] Primary Care Ree Alcalde: [name] Other Clinician: [name] Referring [name]: Treating Melvena Vink/Extender: [name]  in Treatment: 9 Encounter Discharge Information Items Discharge Condition: Stable Ambulatory Status: Ambulatory Discharge Destination: Home Transportation: Private Auto Accompanied By: None Schedule Follow-up Appointment: Yes Clinical Summary of Care: Electronic Signature(s) Signed: 09/06/2021 10:20:45 AM By: [name] EMT Entered By: [name] on 09/06/2021 10:20:45 -------------------------------------------------------------------------------- Vitals Details Patient Name: Date of Service: [name], [name] 09/06/2021 8:00 A M Medical Record Number: [id number] Patient Account Number: [id number] Date of Birth/Sex: Treating RN: 01-27-1970 (51 y.o. [name] Primary Care Nathanyl Andujo: [name] Other Clinician: [name] Referring [name]: Treating Laverne Hursey/Extender: [name] in Treatment: 9 Vital Signs Time Taken: 08:04 Temperature (F): 98.8 Height (in): 72 Pulse (bpm): 88 Weight (lbs): 312 Respiratory Rate (breaths/min): 16 Body Mass Index (BMI): 42.3 Blood Pressure (mmHg): 130/82 Capillary Blood Glucose (mg/dl): 139 Reference Range: 80 - 120 mg / dl Electronic Signature(s) Signed: 09/06/2021 8:42:56 AM By: [name] EMT Entered By: [name] on 09/06/2021 08:42:56

## 2021-09-06 NOTE — Progress Notes (Signed)
[name], [name] ([id number]) Visit Report for 09/02/2021 Chief Complaint Document Details Patient Name: Date of Service: [name], [name] 09/02/2021 10:30 A M Medical Record Number: [id number] Patient Account Number: [id number] Date of Birth/Sex: Treating RN: 1971/01/08 [name] Primary Care Provider: Garret [name] Other Clinician: Referring Provider: Treating Provider/Extender: [name]: 8 Information Obtained from: Patient Chief Complaint Patients presents for treatment of an open diabetic ulcer Electronic Signature(s) Signed: 09/02/2021 10:40:03 AM By: [name] Maudlin MD FACS Entered By: [name] on 09/02/2021 10:40:03 -------------------------------------------------------------------------------- Debridement Details Patient Name: Date of Service: [name], [name] 09/02/2021 10:30 A M Medical Record Number: [id number] Patient Account Number: [id number] Date of Birth/Sex: Treating RN: [birth year] [name] Primary Care Provider: Garret [name] Other Clinician: Referring Provider: Treating Provider/Extender: [name]: 8 Debridement Performed for Assessment: Wound #2 Right,Lateral,Plantar Foot Performed By: Physician [name] Maudlin, MD Debridement Type: Debridement Severity of Tissue Pre Debridement: Necrosis of bone Level of Consciousness (Pre-procedure): Awake and Alert Pre-procedure Verification/Time Out Yes - 10:30 Taken: Start Time: 10:31 Pain Control: Lidocaine 4% Topical Solution T Area Debrided (L x W): otal 2 (cm) x 2 (cm) = 4 (cm) Tissue and other material debrided: Non-Viable, Callus, Skin: Epidermis Level: Skin/Epidermis Debridement Description: Selective/Open Wound Instrument: Curette Bleeding: Minimum Hemostasis Achieved: Pressure Procedural Pain: 0 Post Procedural Pain: 0 Response to Treatment: Procedure was tolerated  well Level of Consciousness (Post- Awake and Alert procedure): Post Debridement Measurements of Total Wound Length: (cm) 0.4 Width: (cm) 2 Depth: (cm) 2.9 Volume: (cm) 1.822 Character of Wound/Ulcer Post Debridement: Improved Severity of Tissue Post Debridement: Necrosis of bone Post Procedure Diagnosis Same as Pre-procedure Electronic Signature(s) Signed: 09/02/2021 10:46:29 AM By: [name] Maudlin MD FACS Signed: 09/02/2021 4:52:29 PM By: [name] Gouty RN, BSN Entered By: [name] on 09/02/2021 10:36:32 -------------------------------------------------------------------------------- HPI Details Patient Name: Date of Service: [name], [name] 09/02/2021 10:30 A M Medical Record Number: [id number] Patient Account Number: [id number] Date of Birth/Sex: Treating RN: 1970-07-16 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 The most recent amputation was [name] August 2022. At that operation, antibiotic beads were placed [name] the wound. He has been managed by podiatry for his procedures and management of his wounds. 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 They have been using Betadine wet to dry dressings along with Iodosorb. The patient states that when he thinks the wound is getting too dry, he applies topical Neosporin. At his last visit, on June 7 of this year, the podiatrist determined that he felt the wound was stalled and referred him to wound care for additional evaluation and management. An MRI has been ordered, but not yet scheduled or performed. Pathology from his  operation [name] August 2022 demonstrated findings consistent with chronic osteomyelitis. Today, there is a large irregular wound on the plantar surface of his right foot, at about the level of the fifth metatarsal head. This tracks through to a pinpoint opening on the dorsal lateral portion of his foot. The intake nurse reported purulent drainage. There is some malodor from the  wound. No frank necrosis identified. 07/11/2021: Today, the wounds do not connect. I attempted multiple times from various directions and the shared tunnel is no longer open. He has some slough accumulation on the dorsal part of his foot as well as slough and callus buildup on the plantar surface. His MRI is scheduled for June 29. No purulent drainage or malodor appreciated today. 07/19/2021: The lateral foot wound has closed and there is no tunnel connecting the plantar foot wound to that site. The plantar foot wound still probes quite deeply, however. There is some slough, eschar, and nonviable tissue accumulated [name] the wound bed. No malodorous drainage present. His MRI was performed yesterday and is consistent with fairly extensive osteomyelitis. 07/29/2021: The patient has an appointment with infectious disease on July 18 to treat his osteomyelitis. The plantar wound still probes quite deeply, approaching bone. The orifice has narrowed quite substantially, however, making it more difficult for him to pack. 08/05/2021: The tunnel connecting the lateral foot wound to the plantar foot wound has reopened. He sees infectious disease tomorrow to discuss long-term antibiotic treatment for osteomyelitis. There was a bit of murky drainage [name] the wound, but this was noted after he had had topical lidocaine applied so may have just been a blob of the anesthetic. No odor or frank purulent drainage. The wound probes deeply at the midfoot approaching bone. He does have MRI results confirming his diagnosis of osteomyelitis. He has failed to  progress with conventional treatment and I think his best chance for preservation of the foot is to initiate hyperbaric oxygen therapy. 08/13/2021: The tunnel connecting the 2 wounds has closed again. He has a PICC line and is getting IV daptomycin and cefepime. EKG and chest x-ray are within normal limits. He is scheduled to start hyperbaric oxygen therapy tomorrow. The wound [name] his midfoot probes deeply, approaching bone. The skin at the orifice continues to heaped up and threatens to close over despite the large cavity within. No erythema, induration, or purulent drainage. The wound on his lateral foot is fairly small and quite clean. 08/19/2021: The lateral foot wound has nearly closed. The wound [name] his midfoot does not probe quite as deeply today. The skin at the orifice continues to try to roll [name] and obscure the opening. No frankly necrotic tissue appreciated. He is tolerating hyperbaric oxygen therapy. 08/26/2021: The lateral foot wound has closed completely. The wound [name] his midfoot is shallower again today. The wound orifice is contracting. We are using gentamicin and silver alginate. He continues on IV daptomycin and cefepime and is tolerating hyperbaric oxygen therapy without difficulty. 09/02/2021: His foot is a little bit sore today but he was up walking on it all weekend doing back to school shopping with his daughter. The tunneling is down to about 3 cm and I do not appreciate bone at the tip of the probe. The wound is clean but has some callus creating an overhanging lip at the distal aspect. He continues to receive hyperbaric oxygen therapy as well as IV daptomycin and cefepime. Electronic Signature(s) Signed: 09/02/2021 10:42:24 AM By: [name] Maudlin MD FACS Entered By: [name] on 09/02/2021 10:42:24 -------------------------------------------------------------------------------- Physical Exam Details Patient Name: Date of Service: [name], [name] 09/02/2021 10:30 A  M Medical Record Number: [id number] Patient Account Number: [id number] Date of Birth/Sex: Treating RN: 19-May-1970 [name] Primary Care Provider: Garret [name] Other Clinician: Referring Provider: Treating Provider/Extender: [name]: 8 Constitutional No acute  distress.Marland Kitchen Respiratory Normal work of breathing on room air.. Notes 09/02/2021: The tunneling is down to about 3 cm and I do not appreciate bone at the tip of the probe. The wound is clean but has some callus creating an overhanging lip at the distal aspect. Electronic Signature(s) Signed: 09/02/2021 10:43:00 AM By: [name] Maudlin MD FACS Entered By: [name] on 09/02/2021 10:43:00 -------------------------------------------------------------------------------- Physician Orders Details Patient Name: Date of Service: [name], [name] 09/02/2021 10:30 A M Medical Record Number: [id number] Patient Account Number: [id number] Date of Birth/Sex: Treating RN: 15-Oct-1970 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 - Dr Celine Ahr Room 3 Return A Mon 8/21//23 @ 10:15 am after HBO Mon 8/28 @ 10:15 am after HBO Anesthetic Wound #2 Right,Lateral,Plantar Foot ([name]) Topical Lidocaine 4% applied to wound bed Bathing/ Shower/ Hygiene Other Bathing/Shower/Hygiene Orders/Instructions: - Change dressing after bathing Off-Loading Other: - Try to not stand on your feet too  much Hyperbaric Oxygen Therapy Evaluate for HBO Therapy Indication: - wagner grade 3 diabetic foot ulcer right foot 2.0 ATA for 90 Minutes without A Breaks ir Total Number of Treatments: - 40 One treatments per day (delivered Monday through Friday unless otherwise specified [name] Special Instructions below): Finger stick Blood Glucose Pre- and Post- HBOT Treatment. Follow Hyperbaric Oxygen Glycemia Protocol Afrin (Oxymetazoline HCL) 0.05% nasal spray - 1 spray [name] both nostrils daily as needed prior to HBO treatment for difficulty clearing ears Wound Treatment Wound #2 - Foot Wound Laterality: Plantar, Right, Lateral Cleanser: Soap and Water Every Other Day/15 Days Discharge Instructions: May shower and wash wound with dial antibacterial soap and water prior to dressing change. Cleanser: Wound Cleanser (Generic) Every Other Day/15 Days Discharge Instructions: Cleanse the wound with wound cleanser prior to applying a clean dressing using gauze sponges, not tissue or cotton balls. Peri-Wound Care: cotton tipped applicators (Generic) Every Other Day/15 Days Topical: Gentamicin Every Other Day/15 Days Discharge Instructions: As directed by physician Prim Dressing: KerraCel Ag Gelling Fiber Dressing, 4x5 [name] (silver alginate) (Generic) Every Other Day/15 Days ary Discharge Instructions: Apply silver alginate to wound bed as instructed Secured With: Paper Tape, 2x10 ([name]/yd) (Generic) Every Other Day/15 Days Discharge Instructions: Secure dressing with tape as directed. Compression Wrap: Kerlix Roll 4.5x3.1 ([name]/yd) (Generic) Every Other Day/15 Days Discharge Instructions: Apply Kerlix and Coban compression as directed. GLYCEMIA INTERVENTIONS PROTOCOL PRE-HBO GLYCEMIA INTERVENTIONS ACTION INTERVENTION Obtain pre-HBO capillary blood glucose (ensure 1 physician order is [name] chart). A. Notify HBO physician and await physician orders. 2 If result is 70 mg/dl or below: B. If the result meets the  hospital definition of a critical result, follow hospital policy. A. Give patient an 8 ounce Glucerna Shake, an 8 ounce Ensure, or 8 ounces of a Glucerna/Ensure equivalent dietary supplement*. B. Wait 30 minutes. If result is 71 mg/dl to 130 mg/dl: C. Retest patients capillary blood glucose (CBG). D. If result greater than or equal to 110 mg/dl, proceed with HBO. If result less than 110 mg/dl, notify HBO physician and consider holding HBO. If result is 131 mg/dl to 249 mg/dl: A. Proceed with HBO. A. Notify HBO  physician and await physician orders. B. It is recommended to hold HBO and do If result is 250 mg/dl or greater: blood/urine ketone testing. C. If the result meets the hospital definition of a critical result, follow hospital policy. POST-HBO GLYCEMIA INTERVENTIONS ACTION INTERVENTION Obtain post HBO capillary blood glucose (ensure 1 physician order is [name] chart). A. Notify HBO physician and await physician orders. 2 If result is 70 mg/dl or below: B. If the result meets the hospital definition of a critical result, follow hospital policy. A. Give patient an 8 ounce Glucerna Shake, an 8 ounce Ensure, or 8 ounces of a Glucerna/Ensure equivalent dietary supplement*. B. Wait 15 minutes for symptoms of If result is 71 mg/dl to 100 mg/dl: hypoglycemia (i.e. nervousness, anxiety, sweating, chills, clamminess, irritability, confusion, tachycardia or dizziness). C. If patient asymptomatic, discharge patient. If patient symptomatic, repeat capillary blood glucose (CBG) and notify HBO physician. If result is 101 mg/dl to 249 mg/dl: A. Discharge patient. A. Notify HBO physician and await physician orders. B. It is recommended to do blood/urine ketone If result is 250 mg/dl or greater: testing. C. If the result meets the hospital definition of a critical result, follow hospital policy. *Juice or candies are NOT equivalent products. If patient refuses the Glucerna or  Ensure, please consult the hospital dietitian for an appropriate substitute. Electronic Signature(s) Signed: 09/02/2021 10:46:29 AM By: [name] Maudlin MD FACS Entered By: [name] on 09/02/2021 10:43:11 -------------------------------------------------------------------------------- Problem List Details Patient Name: Date of Service: [name], [name] 09/02/2021 10:30 A M Medical Record Number: [id number] Patient Account Number: [id number] Date of Birth/Sex: Treating RN: [birth year] 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 09/02/2021 10:30 A M Medical Record Number: [id number] Patient Account Number: [id number] Date of Birth/Sex: Treating RN: 11/23/1970 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 The most recent amputation was [name] August 2022. At that operation, antibiotic beads were placed [name] the wound. He has been managed by podiatry for his procedures and management of his wounds. 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 They have been using Betadine wet to dry dressings along with Iodosorb. The patient states that when he thinks the wound is getting too dry, he applies topical Neosporin. At his last visit, on June 7 of this year, the podiatrist determined that he felt the wound was stalled and referred him to wound care for additional evaluation and management. An MRI has been ordered, but not yet scheduled  or performed. Pathology from his operation [name] August 2022 demonstrated findings consistent with chronic osteomyelitis. Today, there is a large irregular wound on the plantar surface of his right foot, at about the level of the fifth metatarsal head. This tracks through to a pinpoint opening on the dorsal lateral portion of his foot. The intake nurse reported purulent drainage. There is some malodor from the wound. No frank necrosis identified. 07/11/2021: Today, the wounds do not connect. I attempted multiple times from various directions and the shared tunnel is no longer open. He has some slough accumulation on the dorsal part of his foot as well as slough and callus buildup on the plantar surface. His MRI is scheduled for June 29. No purulent drainage or malodor appreciated today. 07/19/2021: The lateral foot wound has closed and there is no tunnel connecting the plantar foot wound to that site. The plantar foot wound still probes quite deeply, however. There is some slough, eschar, and nonviable tissue accumulated [name] the wound bed. No malodorous drainage present. His MRI was performed yesterday and is consistent with fairly  extensive osteomyelitis. 07/29/2021: The patient has an appointment with infectious disease on July 18 to treat his osteomyelitis. The plantar wound still probes quite deeply, approaching bone. The orifice has narrowed quite substantially, however, making it more difficult for him to pack. 08/05/2021: The tunnel connecting the lateral foot wound to the plantar foot wound has reopened. He sees infectious disease tomorrow to discuss long-term antibiotic treatment for osteomyelitis. There was a bit of murky drainage [name] the wound, but this was noted after he had had topical lidocaine applied so may have just been a blob of the anesthetic. No odor or frank purulent drainage. The wound probes deeply at the midfoot approaching bone. He does have MRI results confirming his diagnosis of osteomyelitis. He has failed to progress with conventional treatment and I think his best chance for preservation of the foot is to initiate hyperbaric oxygen therapy. 08/13/2021: The tunnel connecting the 2 wounds has closed again. He has a PICC line and is getting IV daptomycin and cefepime. EKG and chest x-ray are within normal limits. He is scheduled to start hyperbaric oxygen therapy tomorrow. The wound [name] his midfoot probes deeply, approaching bone. The skin at the orifice continues to heaped up and threatens to close over despite the large cavity within. No erythema, induration, or purulent drainage. The wound on his lateral foot is fairly small and quite clean. 08/19/2021: The lateral foot wound has nearly closed. The wound [name] his midfoot does not probe quite as deeply today. The skin at the orifice continues to try to roll [name] and obscure the opening. No frankly necrotic tissue appreciated. He is tolerating hyperbaric oxygen therapy. 08/26/2021: The lateral foot wound has closed completely. The wound [name] his midfoot is shallower again today. The wound orifice is contracting. We are using gentamicin and silver alginate. He  continues on IV daptomycin and cefepime and is tolerating hyperbaric oxygen therapy without difficulty. 09/02/2021: His foot is a little bit sore today but he was up walking on it all weekend doing back to school shopping with his daughter. The tunneling is down to about 3 cm and I do not appreciate bone at the tip of the probe. The wound is clean but has some callus creating an overhanging lip at the distal aspect. He continues to receive hyperbaric oxygen therapy as well as IV daptomycin and cefepime. Patient History Information obtained from Patient. Social History Never smoker, Marital  Status - Married, Alcohol Use - Never, Drug Use - No History, Caffeine Use - Daily - T coffee. ea; Medical History Endocrine Patient has history of Type II Diabetes Hospitalization/Surgery History - Amuptation of 3rd,4th and 5th toes of Right foot;Oral Surgery;Anal Fissure surgery; Cholecystectomy. Objective Constitutional No acute distress.. Vitals Time Taken: 10:15 AM, Height: 72 [name], Weight: 312 lbs, BMI: 42.3. General Notes: taken post HBOT Respiratory Normal work of breathing on room air.. General Notes: 09/02/2021: The tunneling is down to about 3 cm and I do not appreciate bone at the tip of the probe. The wound is clean but has some callus creating an overhanging lip at the distal aspect. Integumentary (Hair, Skin) Wound #2 status is Open. Original cause of wound was Gradually Appeared. The date acquired was: 12/19/2016. The wound has been [name] 8 weeks. The wound is located on the [place]. The wound measures 0.3cm length x 1.9cm width x 2.9cm depth; 0.448cm^2 area and 1.298cm^3 volume. There is Fat Layer (Subcutaneous Tissue) exposed. There is no tunneling noted, however, there is undermining starting at 3:00 and ending at 9:00 with a maximum distance of 0.8cm. There is a medium amount of serosanguineous drainage noted. The wound margin is thickened. There is large  (67-100%) red granulation within the wound bed. There is a small (1-33%) amount of necrotic tissue within the wound bed including Adherent Slough. Assessment Active Problems ICD-10 Type 2 diabetes mellitus with other diabetic neurological complication Other chronic osteomyelitis, right ankle and foot Non-pressure chronic ulcer of other part of right foot with necrosis of bone Procedures Wound #2 Pre-procedure diagnosis of Wound #2 is a Diabetic Wound/Ulcer of the Lower Extremity located on the Right,Lateral,Plantar Foot .Severity of Tissue Pre Debridement is: Necrosis of bone. There was a Selective/Open Wound Skin/Epidermis Debridement with a total area of 4 sq cm performed by [name] Maudlin, MD. With the following instrument(s): Curette to remove Non-Viable tissue/material. Material removed includes Callus and Skin: Epidermis and after achieving pain control using Lidocaine 4% Topical Solution. No specimens were taken. A time out was conducted at 10:30, prior to the start of the procedure. A Minimum amount of bleeding was controlled with Pressure. The procedure was tolerated well with a pain level of 0 throughout and a pain level of 0 following the procedure. Post Debridement Measurements: 0.4cm length x 2cm width x 2.9cm depth; 1.822cm^3 volume. Character of Wound/Ulcer Post Debridement is improved. Severity of Tissue Post Debridement is: Necrosis of bone. Post procedure Diagnosis Wound #2: Same as Pre-Procedure Plan Follow-up Appointments: Return Appointment [name] 1 week. - Dr Celine Ahr Room 3 Mon 8/21//23 @ 10:15 am after HBO Mon 8/28 @ 10:15 am after HBO Anesthetic: Wound #2 Right,Lateral,Plantar Foot: ([name]) Topical Lidocaine 4% applied to wound bed Bathing/ Shower/ Hygiene: Other Bathing/Shower/Hygiene Orders/Instructions: - Change dressing after bathing Off-Loading: Other: - Try to not stand on your feet too much Hyperbaric Oxygen Therapy: Evaluate for HBO Therapy Indication: -  wagner grade 3 diabetic foot ulcer right foot 2.0 ATA for 90 Minutes without Air Breaks T Number of Treatments: - 40 otal One treatments per day (delivered Monday through Friday unless otherwise specified [name] Special Instructions below): Finger stick Blood Glucose Pre- and Post- HBOT Treatment. Follow Hyperbaric Oxygen Glycemia Protocol Afrin (Oxymetazoline HCL) 0.05% nasal spray - 1 spray [name] both nostrils daily as needed prior to HBO treatment for difficulty clearing ears WOUND #2: - Foot Wound Laterality: Plantar, Right, Lateral Cleanser: Soap and Water Every Other Day/15 Days Discharge Instructions:  May shower and wash wound with dial antibacterial soap and water prior to dressing change. Cleanser: Wound Cleanser (Generic) Every Other Day/15 Days Discharge Instructions: Cleanse the wound with wound cleanser prior to applying a clean dressing using gauze sponges, not tissue or cotton balls. Peri-Wound Care: cotton tipped applicators (Generic) Every Other Day/15 Days Topical: Gentamicin Every Other Day/15 Days Discharge Instructions: As directed by physician Prim Dressing: KerraCel Ag Gelling Fiber Dressing, 4x5 [name] (silver alginate) (Generic) Every Other Day/15 Days ary Discharge Instructions: Apply silver alginate to wound bed as instructed Secured With: Paper T ape, 2x10 ([name]/yd) (Generic) Every Other Day/15 Days Discharge Instructions: Secure dressing with tape as directed. Com pression Wrap: Kerlix Roll 4.5x3.1 ([name]/yd) (Generic) Every Other Day/15 Days Discharge Instructions: Apply Kerlix and Coban compression as directed. 09/02/2021: The tunneling is down to about 3 cm and I do not appreciate bone at the tip of the probe. The wound is clean but has some callus creating an overhanging lip at the distal aspect. I used a curette to debride the skin and callus that was creating the overhanging portion of the wound. The base and surface are robust and viable-looking. He will continue his IV  antibiotics per infectious disease. We will continue using gentamicin and silver alginate packing and he will continue his hyperbaric oxygen therapy. Follow-up [name] 1 week. Electronic Signature(s) Signed: 09/02/2021 10:43:50 AM By: [name] Maudlin MD FACS Entered By: [name] on 09/02/2021 10:43:49 -------------------------------------------------------------------------------- HxROS Details Patient Name: Date of Service: [name], [name] 09/02/2021 10:30 A M Medical Record Number: [id number] Patient Account Number: [id number] Date of Birth/Sex: Treating RN: [birth year] [name] Primary Care Provider: Garret [name] Other Clinician: Referring Provider: Treating Provider/Extender: [name]: 8 Information Obtained From Patient Endocrine Medical History: Positive for: Type II Diabetes Immunizations Pneumococcal Vaccine: Received Pneumococcal Vaccination: Yes Received Pneumococcal Vaccination On or After 60th Birthday: No Implantable Devices None Hospitalization / Surgery History Type of Hospitalization/Surgery Amuptation of 3rd,4th and 5th toes of Right foot;Oral Surgery;Anal Fissure surgery; Cholecystectomy Family and Social History Never smoker; Marital Status - Married; Alcohol Use: Never; Drug Use: No History; Caffeine Use: Daily - T coffee; Financial Concerns: No; Food, ea; Clothing or Shelter Needs: No; Support System Lacking: No; Transportation Concerns: No Electronic Signature(s) Signed: 09/02/2021 10:46:29 AM By: [name] Maudlin MD FACS Signed: 09/02/2021 4:52:29 PM By: [name] Gouty RN, BSN Entered By: [name] on 09/02/2021 10:42:31 -------------------------------------------------------------------------------- SuperBill Details Patient Name: Date of Service: [name], [name] 09/02/2021 Medical Record Number: [id number] Patient Account Number: [id number] Date of Birth/Sex: Treating  RN: [birth year] 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 I62.703 Other chronic osteomyelitis, right ankle and foot L97.514 Non-pressure chronic ulcer of other part of right foot with necrosis of bone Facility Procedures CPT4 Code: [id number] Description: [phone] - DEBRIDE WOUND 1ST 20 SQ CM OR < ICD-10 Diagnosis Description L97.514 Non-pressure chronic ulcer of other part of right foot with necrosis of bone Modifier: Quantity: 1 Physician Procedures : CPT4 Code Description Modifier [id number] - WC PHYS LEVEL 4 - EST PT 25 ICD-10 Diagnosis Description L97.514 Non-pressure chronic ulcer of other part of right foot with necrosis of bone M86.671 Other chronic osteomyelitis, right ankle and foot  E11.49 Type 2 diabetes mellitus with other diabetic  neurological complication Quantity: 1 : [id number] - WC PHYS DEBR WO ANESTH 20 SQ CM ICD-10 Diagnosis Description L97.514 Non-pressure chronic ulcer of other part of right foot with necrosis of bone Quantity: 1 Electronic Signature(s) Signed: 09/02/2021 10:45:53 AM By: [name] Maudlin MD FACS Entered By: [name] on 09/02/2021 10:45:52

## 2021-09-06 NOTE — Progress Notes (Addendum)
[name], Travares M. ([id number]) Visit Report for 09/02/2021 HBO Details Patient Name: Date of Service: [name], [name] 09/02/2021 8:00 A M Medical Record Number: [id number] Patient Account Number: [id number] Date of Birth/Sex: Treating RN: 07/07/1970 [name], [name] Primary Care Donye Campanelli: [name] Other Clinician: [name] Referring Barby Colvard: Treating Jatasia Gundrum/Extender: [name] in Treatment: 8 HBO Treatment Course Details Treatment Course Number: 1 Ordering Shelma Eiben: [name] T Treatments Ordered: otal 40 HBO Treatment Start Date: 08/14/2021 HBO Indication: Diabetic Ulcer(s) of the Lower Extremity Standard/Conservative Wound Care tried and failed greater than or equal to 30 days Wound #2 Right, Lateral, Plantar Foot , Wound #3R Right, Lateral Foot HBO Treatment Details Treatment Number: 14 Patient Type: Outpatient Chamber Type: Monoplace Chamber Serial #: [id number] Treatment Protocol: 2.0 ATA with 90 minutes oxygen, and no air breaks Treatment Details Compression Rate Down: 2.0 psi / minute De-Compression Rate Up: 2.0 psi / minute Air breaks and breathing Decompress Decompress Compress Tx Pressure Begins Reached periods Begins Ends (leave unused spaces blank) Chamber Pressure (ATA 1 2 ------2 1 ) Clock Time (24 hr) 08:05 08:15 - - - - - - 09:46 09:53 Treatment Length: 108 (minutes) Treatment Segments: 4 Vital Signs Capillary Blood Glucose Reference Range: 80 - 120 mg / dl HBO Diabetic Blood Glucose Intervention Range: <131 mg/dl or >249 mg/dl Time Vitals Blood Respiratory Capillary Blood Glucose Pulse Action Type: Pulse: Temperature: Taken: Pressure: Rate: Glucose (mg/dl): Meter #: Oximetry (%) Taken: Pre 08:01 138/88 102 16 98.8 191 Post 10:00 136/84 82 18 97.3 158 Treatment Response Treatment Toleration: Well Treatment Completion Status: Treatment Completed without Adverse Event Treatment  Notes [name] informed of the parent heart rate. She stated to go ahead with treatment. Additional Procedure Documentation Tissue Sevierity: Necrosis of bone Physician HBO Attestation: I certify that I supervised this HBO treatment in accordance with Medicare guidelines. A trained emergency response team is readily available per Yes hospital policies and procedures. Continue HBOT as ordered. Yes Electronic Signature(s) Signed: 09/02/2021 3:35:29 PM By: [name] DO Previous Signature: 09/02/2021 10:18:33 AM Version By: [name] EMT Entered By: [name] on 09/02/2021 15:33:49 -------------------------------------------------------------------------------- HBO Safety Checklist Details Patient Name: Date of Service: [name], [name] 09/02/2021 8:00 A M Medical Record Number: [id number] Patient Account Number: [id number] Date of Birth/Sex: Treating RN: Jun 13, 1970 [name], [name] Primary Care Kalijah Westfall: [name] Other Clinician: [name] Referring Arn Mcomber: Treating Markhi Kleckner/Extender: [name] in Treatment: 8 HBO Safety Checklist Items Safety Checklist Consent Form Signed Patient voided / foley secured and emptied When did you last eato 0650 Last dose of injectable or oral agent 0700 Ostomy pouch emptied and vented if applicable NA All implantable devices assessed, documented and approved PIC line Intravenous access site secured and place PIC Line Valuables secured Linens and cotton and cotton/polyester blend (less than 51% polyester) Personal oil-based products / skin lotions / body lotions removed Wigs or hairpieces removed NA Smoking or tobacco materials removed Books / newspapers / magazines / loose paper removed Cologne, aftershave, perfume and deodorant removed Jewelry removed (may wrap wedding band) NA Make-up removed NA Hair care products removed Battery operated devices (external)  removed Heating patches and chemical warmers removed Titanium eyewear removed NA Nail polish cured greater than 10 hours NA Casting material cured greater than 10 hours NA Hearing aids removed NA Loose dentures or partials removed NA Prosthetics have been removed NA Patient demonstrates correct use of air break  device (if applicable) Patient concerns have been addressed Patient grounding bracelet on and cord attached to chamber Specifics for Inpatients (complete in addition to above) Medication sheet sent with patient NA Intravenous medications needed or due during therapy sent with patient NA Drainage tubes (e.g. nasogastric tube or chest tube secured and vented) NA Endotracheal or Tracheotomy tube secured NA Cuff deflated of air and inflated with saline NA Airway suctioned NA Notes The safety checklist was done before the treatment was started. Electronic Signature(s) Signed: 09/02/2021 10:16:35 AM By: [name] EMT Entered By: [name] on 09/02/2021 10:16:34

## 2021-09-06 NOTE — Progress Notes (Signed)
[name], [name] ([id number]) Visit Report for 09/02/2021 Problem List Details Patient Name: Date of Service: [name], [name] 09/02/2021 8:00 A M Medical Record Number: [id number] Patient Account Number: [id number] Date of Birth/Sex: Treating RN: Jun 11, 1970 (51 y.o. 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 09/02/2021 Medical Record Number: [id number] Patient Account Number: [id number] Date of Birth/Sex: Treating RN: [birth year] (51 y.o. 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 MJ62.831Other chronic osteomyelitis, right ankle and foot L97.514  Non-pressure chronic ulcer of other part of right foot with necrosis of bone Modifier: Quantity: 4 Physician Procedures : CPT4 Code Description Modifier [id number] 73710- WC PHYS HYPERBARIC OXYGEN THERAPY ICD-10 Diagnosis Description 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

## 2021-09-06 NOTE — Progress Notes (Signed)
[name], [name] ([id number]) Visit Report for 09/06/2021 HBO Details Patient Name: Date of Service: [name], [name] 09/06/2021 8:00 A M Medical Record Number: [id number] Patient Account Number: [id number] Date of Birth/Sex: Treating RN: 08-04-1970 [name] Primary Care Ozias Dicenzo: [name] Other Clinician: [name] Referring Eleri Ruben: Treating Myesha Stillion/Extender: [name] in Treatment: 9 HBO Treatment Course Details Treatment Course Number: 1 Ordering Brandy Zuba: [name] T Treatments Ordered: otal 40 HBO Treatment Start Date: 08/14/2021 HBO Indication: Diabetic Ulcer(s) of the Lower Extremity Standard/Conservative Wound Care tried and failed greater than or equal to 30 days Wound #2 Right, Lateral, Plantar Foot , Wound #3R Right, Lateral Foot HBO Treatment Details Treatment Number: 18 Patient Type: Outpatient Chamber Type: Monoplace Chamber Serial #: [id number] Treatment Protocol: 2.0 ATA with 90 minutes oxygen, and no air breaks Treatment Details Compression Rate Down: 2.0 psi / minute De-Compression Rate Up: 2.0 psi / minute Air breaks and breathing Decompress Decompress Compress Tx Pressure Begins Reached periods Begins Ends (leave unused spaces blank) Chamber Pressure (ATA 1 2 ------2 1 ) Clock Time (24 hr) 08:06 08:15 - - - - - - 09:46 09:52 Treatment Length: 106 (minutes) Treatment Segments: 4 Vital Signs Capillary Blood Glucose Reference Range: 80 - 120 mg / dl HBO Diabetic Blood Glucose Intervention Range: <131 mg/dl or >249 mg/dl Time Vitals Blood Respiratory Capillary Blood Glucose Pulse Action Type: Pulse: Temperature: Taken: Pressure: Rate: Glucose (mg/dl): Meter #: Oximetry (%) Taken: Pre 08:04 130/82 88 16 98.8 139 Post 09:59 141/89 67 18 97.3 118 Treatment Response Treatment Toleration: Well Treatment Completion Status: Treatment Completed without Adverse Event Additional  Procedure Documentation Tissue Sevierity: Necrosis of bone Physician HBO Attestation: I certify that I supervised this HBO treatment in accordance with Medicare guidelines. A trained emergency response team is readily available per Yes hospital policies and procedures. Continue HBOT as ordered. Yes Electronic Signature(s) Signed: 09/06/2021 12:35:58 PM By: [name] DO Previous Signature: 09/06/2021 10:19:39 AM Version By: [name] EMT Previous Signature: 09/06/2021 8:45:02 AM Version By: [name] EMT Previous Signature: 09/06/2021 8:45:02 AM Version By: [name] EMT Entered By: [name] on 09/06/2021 11:45:57 -------------------------------------------------------------------------------- HBO Safety Checklist Details Patient Name: Date of Service: [name], [name] 09/06/2021 8:00 A M Medical Record Number: [id number] Patient Account Number: [id number] Date of Birth/Sex: Treating RN: January 25, 1970 [name] Primary Care Yader Criger: [name] Other Clinician: [name] Referring Dustan Hyams: Treating Elie Leppo/Extender: [name] in Treatment: 9 HBO Safety Checklist Items Safety Checklist Consent Form Signed Patient voided / foley secured and emptied When did you last eato 0700 Last dose of injectable or oral agent 0650 Ostomy pouch emptied and vented if applicable NA All implantable devices assessed, documented and approved PIC line Intravenous access site secured and place PIC Line Valuables secured Linens and cotton and cotton/polyester blend (less than 51% polyester) Personal oil-based products / skin lotions / body lotions removed Wigs or hairpieces removed NA Smoking or tobacco materials removed Books / newspapers / magazines / loose paper removed Cologne, aftershave, perfume and deodorant removed Jewelry removed (may wrap wedding band) NA Make-up removed NA Hair care products  removed Battery operated devices (external) removed Heating patches and chemical warmers removed Titanium eyewear removed NA Nail polish cured greater than 10 hours NA Casting material cured greater than 10 hours NA Hearing aids removed NA Loose dentures or partials removed NA Prosthetics have been removed NA Patient demonstrates correct use  of air break device (if applicable) Patient concerns have been addressed Patient grounding bracelet on and cord attached to chamber Specifics for Inpatients (complete in addition to above) Medication sheet sent with patient NA Intravenous medications needed or due during therapy sent with patient NA Drainage tubes (e.g. nasogastric tube or chest tube secured and vented) NA Endotracheal or Tracheotomy tube secured NA Cuff deflated of air and inflated with saline NA Airway suctioned NA Notes The safety checklist was done before the treatment was started. Electronic Signature(s) Signed: 09/06/2021 8:44:15 AM By: [name] EMT Entered By: [name] on 09/06/2021 08:44:15

## 2021-09-06 NOTE — Progress Notes (Signed)
[name], Hanley M. ([id number]) Visit Report for 09/06/2021 Problem List Details Patient Name: Date of Service: [name], [name] 09/06/2021 8:00 A M Medical Record Number: [id number] Patient Account Number: [id number] Date of Birth/Sex: Treating RN: [birth year] 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 09/06/2021 Medical Record Number: [id number] Patient Account Number: [id number] Date of Birth/Sex: Treating RN: 26-Apr-1970 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 LH15.056Non-pressure chronic ulcer of other part of right foot with necrosis  of bone M86.671 Other chronic osteomyelitis, right ankle and foot Modifier: Quantity: 4 Physician Procedures : CPT4 Code Description Modifier [id number] 65537- WC PHYS HYPERBARIC OXYGEN THERAPY ICD-10 Diagnosis Description 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

## 2021-09-09 ENCOUNTER — Encounter (HOSPITAL_BASED_OUTPATIENT_CLINIC_OR_DEPARTMENT_OTHER): Payer: BLUE CROSS/BLUE SHIELD | Admitting: Internal Medicine

## 2021-09-09 ENCOUNTER — Encounter (HOSPITAL_BASED_OUTPATIENT_CLINIC_OR_DEPARTMENT_OTHER): Payer: BLUE CROSS/BLUE SHIELD | Admitting: General Surgery

## 2021-09-09 DIAGNOSIS — E11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ical complication: Secondary | ICD-10-CM | POA: Diagnosis not present

## 2021-09-09 DIAGNOSIS — M86671 Other chronic osteomyelitis, right ankle and foot: Secondary | ICD-10-CM | POA: Diagnosis not present

## 2021-09-09 DIAGNOSIS — L97514 Non-pressure chronic ulcer of other part of right foot with necrosis of bone: Secondary | ICD-10-CM | POA: Diagnosis not present

## 2021-09-09 LAB — GLUCOSE, CAPILLARY
Glucose-Capillary: 132 mg/dL — ABNORMAL HIGH (ref 70–99)
Glucose-Capillary: 135 mg/dL — ABNORMAL HIGH (ref 70–99)

## 2021-09-09 NOTE — Progress Notes (Addendum)
[name], [name] ([id number]) Visit Report for 09/09/2021 HBO Details Patient Name: Date of Service: [name],  [name] 09/09/2021 8:00 A M Medical Record Number: [id number] Patient Account Number: [id number] Date of Birth/Sex: Treating RN: [birth year] [name] Primary Care Hayward Rylander: [name] Other Clinician: [name] Referring Samarra Ridgely: Treating Ceejay Kegley/Extender: [name] in Treatment: 9 HBO Treatment Course Details Treatment Course Number: 1 Ordering Dora Clauss: [name] T Treatments Ordered: otal 40 HBO Treatment Start Date: 08/14/2021 HBO Indication: Diabetic Ulcer(s) of the Lower Extremity Standard/Conservative Wound Care tried and failed greater than or equal to 30 days Wound #2 Right, Lateral, Plantar Foot , Wound #3R Right, Lateral Foot HBO Treatment Details Treatment Number: 19 Patient Type: Outpatient Chamber Type: Monoplace Chamber Serial #: [id number] Treatment Protocol: 2.0 ATA with 90 minutes oxygen, and no air breaks Treatment Details Compression Rate Down: 2.0 psi / minute De-Compression Rate Up: 2.0 psi / minute Air breaks and breathing Decompress Decompress Compress Tx Pressure Begins Reached periods Begins Ends (leave unused spaces blank) Chamber Pressure (ATA 1 2 ------2 1 ) Clock Time (24 hr) 08:13 08:21 - - - - - - 09:51 09:59 Treatment Length: 106 (minutes) Treatment Segments: 4 Vital Signs Capillary Blood Glucose Reference Range: 80 - 120 mg / dl HBO Diabetic Blood Glucose Intervention Range: <131 mg/dl or >249 mg/dl Time Vitals Blood Respiratory Capillary Blood Glucose Pulse Action Type: Pulse: Temperature: Taken: Pressure: Rate: Glucose (mg/dl): Meter #: Oximetry (%) Taken: Pre 08:10 132/85 83 16 98.4 132 Post 10:03 135/85 67 16 97.2 135 Treatment Response Treatment Toleration: Well Treatment Completion Status: Treatment Completed without Adverse Event Additional  Procedure Documentation Tissue Sevierity: Necrosis of bone Physician HBO Attestation: I certify that I supervised this HBO treatment in accordance with Medicare guidelines. A trained emergency response team is readily available per Yes hospital policies and procedures. Continue HBOT as ordered. Yes Electronic Signature(s) Signed: 09/09/2021 3:19:18 PM By: [name] DO Previous Signature: 09/09/2021 10:22:16 AM Version By: [name] EMT Entered By: [name] on 09/09/2021 15:16:23 -------------------------------------------------------------------------------- HBO Safety Checklist Details Patient Name: Date of Service: [name], [name] 09/09/2021 8:00 A M Medical Record Number: [id number] Patient Account Number: [id number] Date of Birth/Sex: Treating RN: [birth year] [name] Primary Care Gentry Seeber: [name] Other Clinician: [name] Referring Francies Inch: Treating Trudee Chirino/Extender: [name] in Treatment: 9 HBO Safety Checklist Items Safety Checklist Consent Form Signed Patient voided / foley secured and emptied When did you last eato 0700 Last dose of injectable or oral agent 0650 Ostomy pouch emptied and vented if applicable NA All implantable devices assessed, documented and approved PIC line Intravenous access site secured and place PIC Line Valuables secured Linens and cotton and cotton/polyester blend (less than 51% polyester) Personal oil-based products / skin lotions / body lotions removed Wigs or hairpieces removed NA Smoking or tobacco materials removed Books / newspapers / magazines / loose paper removed Cologne, aftershave, perfume and deodorant removed Jewelry removed (may wrap wedding band) NA Make-up removed NA Hair care products removed Battery operated devices (external) removed Heating patches and chemical warmers removed Titanium eyewear removed NA Nail polish cured greater  than 10 hours NA Casting material cured greater than 10 hours NA Hearing aids removed NA Loose dentures or partials removed NA Prosthetics have been removed NA Patient demonstrates correct use of air break device (if applicable) Patient concerns have been addressed Patient grounding bracelet on and cord attached to chamber  Specifics for Inpatients (complete in addition to above) Medication sheet sent with patient NA Intravenous medications needed or due during therapy sent with patient NA Drainage tubes (e.g. nasogastric tube or chest tube secured and vented) NA Endotracheal or Tracheotomy tube secured NA Cuff deflated of air and inflated with saline NA Airway suctioned NA Notes The safety checklist was done before the treatment was started Electronic Signature(s) Signed: 09/09/2021 10:31:14 AM By: [name] EMT Previous Signature: 09/09/2021 10:18:49 AM Version By: [name] EMT Entered By: [name] on 09/09/2021 10:31:14

## 2021-09-09 NOTE — Progress Notes (Signed)
[name], Wilkie M. ([id number]) Visit Report for 09/09/2021 Arrival Information Details Patient Name: Date of Service: [place], [place] [name] 09/09/2021 10:15 A M Medical Record Number: [id number] Patient Account Number: [id number] Date of Birth/Sex: Treating RN: 12/08/1970 (51 y.o. [name] Primary Care Danaye Sobh: [name] Other Clinician: Referring Aanyah Loa: Treating Jacub Waiters/Extender: [name] in Treatment: 9 Visit Information History Since Last Visit Added or deleted any medications: No Patient Arrived: Ambulatory Any new allergies or adverse reactions: No Arrival Time: 10:15 Had a fall or experienced change in No Accompanied By: self activities of daily living that may affect Transfer Assistance: None risk of falls: Patient Identification Verified: Yes Signs or symptoms of abuse/neglect since last visito No Patient Requires Transmission-Based Precautions: No Hospitalized since last visit: No Patient Has Alerts: Yes Implantable device outside of the clinic excluding No Patient Alerts: ABI R 1.17 cellular tissue based products placed in the center ABI L 1.29 since last visit: R TBI 1.03 Has Dressing in Place as Prescribed: Yes L TBI 0.77 Pain Present Now: No Electronic Signature(s) Signed: 09/09/2021 11:34:12 AM By: Dellie Catholic RN Entered By: Dellie Catholic on 09/09/2021 10:16:04 -------------------------------------------------------------------------------- Encounter Discharge Information Details Patient Name: Date of Service: [name], [name] 09/09/2021 10:15 A M Medical Record Number: [id number] Patient Account Number: [id number] Date of Birth/Sex: Treating RN: April 19, 1970 (51 y.o. [name] Primary Care Haide Klinker: [name] Other Clinician: Referring Pasqualina Colasurdo: Treating Zakhia Seres/Extender: [name] in Treatment: 9 Encounter Discharge Information Items Post Procedure  Vitals Discharge Condition: Stable Temperature (F): 97.2 Ambulatory Status: Ambulatory Pulse (bpm): 67 Discharge Destination: Home Respiratory Rate (breaths/min): 16 Transportation: Private Auto Blood Pressure (mmHg): 135/85 Accompanied By: self Schedule Follow-up Appointment: Yes Clinical Summary of Care: Patient Declined Electronic Signature(s) Signed: 09/09/2021 11:34:12 AM By: Dellie Catholic RN Entered By: Dellie Catholic on 09/09/2021 11:33:47 -------------------------------------------------------------------------------- Lower Extremity Assessment Details Patient Name: Date of Service: [name], [name] 09/09/2021 10:15 A M Medical Record Number: [id number] Patient Account Number: [id number] Date of Birth/Sex: Treating RN: 05-Sep-1970 (51 y.o. [name] Primary Care Mac Dowdell: [name] Other Clinician: Referring Venecia Mehl: Treating Thedore Pickel/Extender: [name] in Treatment: 9 Edema Assessment Assessed: [name]: No] [Right: No] Edema: [Left: N] [Right: o] Calf Left: Right: Point of Measurement: From Medial Instep 37.5 cm Ankle Left: Right: Point of Measurement: From Medial Instep 24 cm Electronic Signature(s) Signed: 09/09/2021 11:34:12 AM By: Dellie Catholic RN Entered By: Dellie Catholic on 09/09/2021 10:22:49 -------------------------------------------------------------------------------- Multi Wound Chart Details Patient Name: Date of Service: [name], [name] 09/09/2021 10:15 A M Medical Record Number: [id number] Patient Account Number: [id number] Date of Birth/Sex: Treating RN: 04/20/1970 (51 y.o. [name] Primary Care Elin Seats: [name] Other Clinician: Referring Donnita Farina: Treating Kessler Kopinski/Extender: [name] in Treatment: 9 Vital Signs Height(in): 72 Pulse(bpm): 28 Weight(lbs): 312 Blood Pressure(mmHg): 135/85 Body Mass Index(BMI): 42.3 Temperature(F):  97.2 Respiratory Rate(breaths/min): 16 Photos: [2:No Photos Right, Lateral, Plantar Foot] [N/A:N/A N/A] Wound Location: [2:Gradually Appeared] [N/A:N/A] Wounding Event: [2:Diabetic Wound/Ulcer of the Lower] [N/A:N/A] Primary Etiology: [2:Extremity Type II Diabetes] [N/A:N/A] Comorbid History: [2:12/19/2016] [N/A:N/A] Date Acquired: [2:9] [N/A:N/A] Weeks of Treatment: [2:Open] [N/A:N/A] Wound Status: [2:No] [N/A:N/A] Wound Recurrence: [2:0.3x1.9x2] [N/A:N/A] Measurements L x W x D (cm) [2:0.448] [N/A:N/A] A (cm) : rea [2:0.895] [N/A:N/A] Volume (cm) : [2:87.60%] [N/A:N/A] % Reduction in A rea: [2:95.00%] [N/A:N/A] % Reduction in Volume: [2:3] Starting Position 1 (o'clock): [2:9] Ending Position 1 (o'clock): [2:0.3] Maximum  Distance 1 (cm): [2:Yes] [N/A:N/A] Undermining: [2:Grade 3] [N/A:N/A] Classification: [2:Medium] [N/A:N/A] Exudate A mount: [2:Serosanguineous] [N/A:N/A] Exudate Type: [2:red, brown] [N/A:N/A] Exudate Color: [2:Thickened] [N/A:N/A] Wound Margin: [2:Large (67-100%)] [N/A:N/A] Granulation A mount: [2:Red] [N/A:N/A] Granulation Quality: [2:Small (1-33%)] [N/A:N/A] Necrotic A mount: [2:Fat Layer (Subcutaneous Tissue): Yes N/A] Exposed Structures: [2:Fascia: No Tendon: No Muscle: No Joint: No Bone: No Small (1-33%)] [N/A:N/A] Epithelialization: [2:Debridement - Selective/Open Wound N/A] Debridement: Pre-procedure Verification/Time Out 10:33 [N/A:N/A] Taken: [2:Lidocaine 5% topical ointment] [N/A:N/A] Pain Control: [2:Other, Callus] [N/A:N/A] Tissue Debrided: [2:Non-Viable Tissue] [N/A:N/A] Level: [2:0.57] [N/A:N/A] Debridement A (sq cm): [2:rea Curette] [N/A:N/A] Instrument: [2:Minimum] [N/A:N/A] Bleeding: [2:Pressure] [N/A:N/A] Hemostasis A chieved: [2:0] [N/A:N/A] Procedural Pain: [2:0] [N/A:N/A] Post Procedural Pain: [2:Procedure was tolerated well] [N/A:N/A] Debridement Treatment Response: [2:0.3x1.9x2] [N/A:N/A] Post Debridement Measurements L x W x  D (cm) [2:0.895] [N/A:N/A] Post Debridement Volume: (cm) [2:Debridement] [N/A:N/A] Treatment Notes Electronic Signature(s) Signed: 09/09/2021 10:36:57 AM By: [name] MD FACS Signed: 09/09/2021 11:34:12 AM By: Dellie Catholic RN Entered By: [name] on 09/09/2021 10:36:56 -------------------------------------------------------------------------------- Multi-Disciplinary Care Plan Details Patient Name: Date of Service: [name], [name] 09/09/2021 10:15 A M Medical Record Number: [id number] Patient Account Number: [id number] Date of Birth/Sex: Treating RN: 03-Jan-1971 (51 y.o. [name] Primary Care Tamura Lasky: [name] Other Clinician: Referring Jontavius Rabalais: Treating Syair Fricker/Extender: [name] in Treatment: [place] reviewed with physician Active Inactive Nutrition Nursing Diagnoses: Impaired glucose control: actual or potential Potential for alteratiion in Nutrition/Potential for imbalanced nutrition Goals: Patient/caregiver will maintain therapeutic glucose control Date Initiated: 07/29/2021 Target Resolution Date: 11/19/2021 Goal Status: Active Interventions: Assess patient nutrition upon admission and as needed per policy Provide education on elevated blood sugars and impact on wound healing Treatment Activities: Patient referred to Primary Care Physician for further nutritional evaluation : 07/29/2021 Notes: Osteomyelitis Nursing Diagnoses: Infection: osteomyelitis Knowledge deficit related to disease process and management Goals: Patient/caregiver will verbalize understanding of disease process and disease management Date Initiated: 07/29/2021 Target Resolution Date: 11/19/2021 Goal Status: Active Patient's osteomyelitis will resolve Date Initiated: 07/29/2021 Target Resolution Date: 11/19/2021 Goal Status: Active Interventions: Assess for signs and symptoms of osteomyelitis resolution  every visit Provide education on osteomyelitis Treatment Activities: Consult for HBO : 07/29/2021 Notes: Wound/Skin Impairment Nursing Diagnoses: Impaired tissue integrity Goals: Patient/caregiver will verbalize understanding of skin care regimen Date Initiated: 07/04/2021 Target Resolution Date: 11/19/2021 Goal Status: Active Interventions: Assess ulceration(s) every visit Treatment Activities: Skin care regimen initiated : 07/04/2021 Notes: Electronic Signature(s) Signed: 09/09/2021 11:34:12 AM By: Dellie Catholic RN Entered By: Dellie Catholic on 09/09/2021 11:32:02 -------------------------------------------------------------------------------- Pain Assessment Details Patient Name: Date of Service: [name], [name] 09/09/2021 10:15 A M Medical Record Number: [id number] Patient Account Number: [id number] Date of Birth/Sex: Treating RN: Mar 18, 1970 (51 y.o. [name] Primary Care Dossie Swor: [name] Other Clinician: Referring Kaan Tosh: Treating Avary Pitsenbarger/Extender: [name] in Treatment: 9 Active Problems Location of Pain Severity and Description of Pain Patient Has Paino No Site Locations Pain Management and Medication Current Pain Management: Electronic Signature(s) Signed: 09/09/2021 11:34:12 AM By: Dellie Catholic RN Entered By: Dellie Catholic on 09/09/2021 10:16:40 -------------------------------------------------------------------------------- Patient/Caregiver Education Details Patient Name: Date of Service: Horald Chest[place], [name] 8/21/2023andnbsp10:15 A M Medical Record Number: [id number] Patient Account Number: [id number] Date of Birth/Gender: Treating RN: Apr 09, 1970 (51 y.o. [name] Primary Care Physician: [name] Other Clinician: Referring Physician: Treating Physician/Extender: [name] in Treatment: 9 Education Assessment Education Provided  To: Patient  Education Topics Provided Elevated Blood Sugar/ Impact on Healing: Methods: Explain/Verbal Responses: Return demonstration correctly Electronic Signature(s) Signed: 09/09/2021 11:34:12 AM By: Dellie Catholic RN Entered By: Dellie Catholic on 09/09/2021 11:32:23 -------------------------------------------------------------------------------- Wound Assessment Details Patient Name: Date of Service: [name], [name] 09/09/2021 10:15 A M Medical Record Number: [id number] Patient Account Number: [id number] Date of Birth/Sex: Treating RN: 1970/10/26 (51 y.o. [name] Primary Care Kerrington Sova: [name] Other Clinician: Referring Shenay Torti: Treating Kavon Valenza/Extender: [name] in Treatment: 9 Wound Status Wound Number: 2 Primary Etiology: Diabetic Wound/Ulcer of the Lower Extremity Wound Location: Right, Lateral, Plantar Foot Wound Status: Open Wounding Event: Gradually Appeared Comorbid History: Type II Diabetes Date Acquired: 12/19/2016 Weeks Of Treatment: 9 Clustered Wound: No Wound Measurements Length: (cm) 0.3 Width: (cm) 1.9 Depth: (cm) 2 Area: (cm) 0.448 Volume: (cm) 0.895 % Reduction in Area: 87.6% % Reduction in Volume: 95% Epithelialization: Small (1-33%) Undermining: Yes Starting Position (o'clock): 3 Ending Position (o'clock): 9 Maximum Distance: (cm) 0.3 Wound Description Classification: Grade 3 Wound Margin: Thickened Exudate Amount: Medium Exudate Type: Serosanguineous Exudate Color: red, brown Foul Odor After Cleansing: No Slough/Fibrino No Wound Bed Granulation Amount: Large (67-100%) Exposed Structure Granulation Quality: Red Fascia Exposed: No Necrotic Amount: Small (1-33%) Fat Layer (Subcutaneous Tissue) Exposed: Yes Necrotic Quality: Adherent Slough Tendon Exposed: No Muscle Exposed: No Joint Exposed: No Bone Exposed: No Treatment Notes Wound #2 (Foot) Wound Laterality: Plantar,  Right, Lateral Cleanser Soap and Water Discharge Instruction: May shower and wash wound with dial antibacterial soap and water prior to dressing change. Wound Cleanser Discharge Instruction: Cleanse the wound with wound cleanser prior to applying a clean dressing using gauze sponges, not tissue or cotton balls. Peri-Wound Care cotton tipped applicators Topical Gentamicin Discharge Instruction: As directed by physician Primary Dressing KerraCel Ag Gelling Fiber Dressing, 4x5 in (silver alginate) Discharge Instruction: Apply silver alginate to wound bed as instructed Secondary Dressing Secured With Paper Tape, 2x10 (in/yd) Discharge Instruction: Secure dressing with tape as directed. Compression Wrap Kerlix Roll 4.5x3.1 (in/yd) Discharge Instruction: Apply Kerlix and Coban compression as directed. Compression Stockings Add-Ons Electronic Signature(s) Signed: 09/09/2021 11:34:12 AM By: Dellie Catholic RN Entered By: Dellie Catholic on 09/09/2021 10:24:46 -------------------------------------------------------------------------------- Vitals Details Patient Name: Date of Service: [name], [name] 09/09/2021 10:15 A M Medical Record Number: [id number] Patient Account Number: [id number] Date of Birth/Sex: Treating RN: 1970-10-10 (51 y.o. [name] Primary Care Airen Dales: [name] Other Clinician: Referring Bailynn Dyk: Treating Shamir Tuzzolino/Extender: [name] in Treatment: 9 Vital Signs Time Taken: 10:13 Temperature (F): 97.2 Height (in): 72 Pulse (bpm): 67 Weight (lbs): 312 Respiratory Rate (breaths/min): 16 Body Mass Index (BMI): 42.3 Blood Pressure (mmHg): 135/85 Reference Range: 80 - 120 mg / dl Electronic Signature(s) Signed: 09/09/2021 11:34:12 AM By: Dellie Catholic RN Entered By: Dellie Catholic on 09/09/2021 10:16:33

## 2021-09-09 NOTE — Progress Notes (Signed)
[name], Klyde M. ([id number]) Visit Report for 09/09/2021 Problem List Details Patient Name: Date of Service: [name], [name] 09/09/2021 8:00 A M Medical Record Number: [id number] Patient Account Number: [id number] Date of Birth/Sex: Treating RN: July 06, 1970 (51 y.o. 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 09/09/2021 Medical Record Number: [id number] Patient Account Number: [id number] Date of Birth/Sex: Treating RN: August 27, 1970 (51 y.o. 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 LS50.539Non-pressure chronic ulcer of other part of right foot with necrosis  of bone M86.671 Other chronic osteomyelitis, right ankle and foot Modifier: Quantity: 4 Physician Procedures : CPT4 Code Description Modifier [id number] PHYS HYPERBARIC OXYGEN THERAPY ICD-10 Diagnosis Description 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

## 2021-09-09 NOTE — Progress Notes (Signed)
[name], [name] ([id number]) Visit Report for 09/09/2021 Chief Complaint Document Details Patient Name: Date of Service: [place], [place] [name] 09/09/2021 10:15 A M Medical Record Number: [id number] Patient Account Number: [id number] Date of Birth/Sex: Treating RN: [birth year] (51 y.o. [name] Primary Care Provider: [name] Other Clinician: Referring Provider: Treating Provider/Extender: [name] in Treatment: 9 Information Obtained from: Patient Chief Complaint Patients presents for treatment of an open diabetic ulcer Electronic Signature(s) Signed: 09/09/2021 10:37:03 AM By: [name] MD FACS Entered By: [name] on 09/09/2021 10:37:03 -------------------------------------------------------------------------------- Debridement Details Patient Name: Date of Service: [name], [name] 09/09/2021 10:15 A M Medical Record Number: [id number] Patient Account Number: [id number] Date of Birth/Sex: Treating RN: 02-26-1970 (51 y.o. [name] Primary Care Provider: [name] Other Clinician: Referring Provider: Treating Provider/Extender: [name] in Treatment: 9 Debridement Performed for Assessment: Wound #2 Right,Lateral,Plantar Foot Performed By: Physician [name], MD Debridement Type: Debridement Severity of Tissue Pre Debridement: Fat layer exposed Level of Consciousness (Pre-procedure): Awake and Alert Pre-procedure Verification/Time Out Yes - 10:33 Taken: Start Time: 10:33 Pain Control: Lidocaine 5% topical ointment T Area Debrided (L x W): otal 0.3 (cm) x 1.9 (cm) = 0.57 (cm) Tissue and other material debrided: Non-Viable, Callus, Other: skin Level: Non-Viable Tissue Debridement Description: Selective/Open Wound Instrument: Curette Bleeding: Minimum Hemostasis Achieved: Pressure End Time: 10:34 Procedural Pain: 0 Post Procedural Pain: 0 Response to Treatment:  Procedure was tolerated well Level of Consciousness (Post- Awake and Alert procedure): Post Debridement Measurements of Total Wound Length: (cm) 0.3 Width: (cm) 1.9 Depth: (cm) 2 Volume: (cm) 0.895 Character of Wound/Ulcer Post Debridement: Improved Severity of Tissue Post Debridement: Fat layer exposed Post Procedure Diagnosis Same as Pre-procedure Electronic Signature(s) Signed: 09/09/2021 10:40:22 AM By: [name] MD FACS Signed: 09/09/2021 11:34:12 AM By: Dellie Catholic RN Entered By: Dellie Catholic on 09/09/2021 10:35:59 -------------------------------------------------------------------------------- HPI Details Patient Name: Date of Service: [name], [name] 09/09/2021 10:15 A M Medical Record Number: [id number] Patient Account Number: [id number] Date of Birth/Sex: Treating RN: [birth year] (51 y.o. 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 The most recent amputation was in August 2022. At that operation, antibiotic beads were placed in the wound. He has been managed by podiatry for his procedures and management of his wounds. He has been in a Water engineer. He is on oral doxycycline. They have been using Betadine wet to dry dressings along with Iodosorb. The patient states that when he thinks the wound is getting too dry, he applies topical Neosporin. At his last visit, on June 7 of this year, the podiatrist determined that he felt the wound was stalled and referred him to wound care for additional evaluation and management. An MRI has been ordered, but not yet scheduled or performed.  Pathology from his operation in August 2022 demonstrated findings consistent with chronic osteomyelitis. Today, there is a large irregular wound on the plantar surface of his right foot, at about the level of the fifth metatarsal head. This tracks through to a pinpoint opening on the dorsal lateral portion of his foot. The intake nurse reported purulent drainage. There is some  malodor from the wound. No frank necrosis identified. 07/11/2021: Today, the wounds do not connect. I attempted multiple times from various directions and the shared tunnel is no longer open. He has some slough accumulation on the dorsal part of his foot as well as slough and callus buildup on the plantar surface. His MRI is scheduled for June 29. No purulent drainage or malodor appreciated today. 07/19/2021: The lateral foot wound has closed and there is no tunnel connecting the plantar foot wound to that site. The plantar foot wound still probes quite deeply, however. There is some slough, eschar, and nonviable tissue accumulated in the wound bed. No malodorous drainage present. His MRI was performed yesterday and is consistent with fairly extensive osteomyelitis. 07/29/2021: The patient has an appointment with infectious disease on July 18 to treat his osteomyelitis. The plantar wound still probes quite deeply, approaching bone. The orifice has narrowed quite substantially, however, making it more difficult for him to pack. 08/05/2021: The tunnel connecting the lateral foot wound to the plantar foot wound has reopened. He sees infectious disease tomorrow to discuss long-term antibiotic treatment for osteomyelitis. There was a bit of murky drainage in the wound, but this was noted after he had had topical lidocaine applied so may have just been a blob of the anesthetic. No odor or frank purulent drainage. The wound probes deeply at the midfoot approaching bone. He does have MRI results confirming his diagnosis of osteomyelitis.  He has failed to progress with conventional treatment and I think his best chance for preservation of the foot is to initiate hyperbaric oxygen therapy. 08/13/2021: The tunnel connecting the 2 wounds has closed again. He has a PICC line and is getting IV daptomycin and cefepime. EKG and chest x-ray are within normal limits. He is scheduled to start hyperbaric oxygen therapy tomorrow. The wound in his midfoot probes deeply, approaching bone. The skin at the orifice continues to heaped up and threatens to close over despite the large cavity within. No erythema, induration, or purulent drainage. The wound on his lateral foot is fairly small and quite clean. 08/19/2021: The lateral foot wound has nearly closed. The wound in his midfoot does not probe quite as deeply today. The skin at the orifice continues to try to roll in and obscure the opening. No frankly necrotic tissue appreciated. He is tolerating hyperbaric oxygen therapy. 08/26/2021: The lateral foot wound has closed completely. The wound in his midfoot is shallower again today. The wound orifice is contracting. We are using gentamicin and silver alginate. He continues on IV daptomycin and cefepime and is tolerating hyperbaric oxygen therapy without difficulty. 09/02/2021: His foot is a little bit sore today but he was up walking on it all weekend doing back to school shopping with his daughter. The tunneling is down to about 3 cm and I do not appreciate bone at the tip of the probe. The wound is clean but has some callus creating an overhanging lip at the distal aspect. He continues to receive hyperbaric oxygen therapy as well as IV daptomycin and cefepime. 09/09/2021: His wound continues to improve. The tunnel has come in by 0.9 cm. He continues to form callus around the orifice of the wound. He is tolerating hyperbaric oxygen therapy along with his IV antibiotics. Electronic Signature(s) Signed: 09/09/2021 10:37:56 AM By: [name] MD  FACS Entered By: [name] on 09/09/2021 10:37:56 -------------------------------------------------------------------------------- Physical Exam Details Patient Name: Date of Service: [name], [name] 09/09/2021 10:15 A M Medical Record  Number: [id number] Patient Account Number: [id number] Date of Birth/Sex: Treating RN: 1970-07-29 (51 y.o. [name] Primary Care Provider: [name] Other Clinician: Referring Provider: Treating Provider/Extender: [name] in Treatment: 9 Constitutional . . . . No acute distress.Marland Kitchen Respiratory Normal work of breathing on room air.. Notes 09/09/2021: His wound continues to improve. The tunnel has come in by 0.9 cm. He continues to form callus around the orifice of the wound. Electronic Signature(s) Signed: 09/09/2021 10:38:28 AM By: [name] MD FACS Entered By: [name] on 09/09/2021 10:38:27 -------------------------------------------------------------------------------- Physician Orders Details Patient Name: Date of Service: [name], [name] 09/09/2021 10:15 A M Medical Record Number: [id number] Patient Account Number: [id number] Date of Birth/Sex: Treating RN: [birth year] (51 y.o. 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 - Dr Celine Ahr Room 3 Return A Mon 8/28 @ 10:15 am after HBO Anesthetic Wound #2 Right,Lateral,Plantar Foot (In clinic) Topical Lidocaine 4% applied to wound bed Bathing/ Shower/  Hygiene Other Bathing/Shower/Hygiene Orders/Instructions: - Change dressing after bathing Off-Loading Other: - Try to not stand on your feet too much Hyperbaric Oxygen Therapy Evaluate for HBO Therapy Indication: - wagner grade 3 diabetic foot ulcer right foot 2.0 ATA for 90 Minutes without A Breaks ir Total Number of Treatments: - 40 One treatments per day (delivered Monday through Friday unless otherwise specified in Special Instructions below): Finger stick Blood Glucose Pre- and Post- HBOT Treatment. Follow Hyperbaric Oxygen Glycemia Protocol Afrin (Oxymetazoline HCL) 0.05% nasal spray - 1 spray in both nostrils daily as needed prior to HBO treatment for difficulty clearing ears Wound Treatment Wound #2 - Foot Wound Laterality: Plantar, Right, Lateral Cleanser: Soap and Water Every Other Day/15 Days Discharge Instructions: May shower and wash wound with dial antibacterial soap and water prior to dressing change. Cleanser: Wound Cleanser (Generic) Every Other Day/15 Days Discharge Instructions: Cleanse the wound with wound cleanser prior to applying a clean dressing using gauze sponges, not tissue or cotton balls. Peri-Wound Care: cotton tipped applicators (Generic) Every Other Day/15 Days Topical: Gentamicin Every Other Day/15 Days Discharge Instructions: As directed by physician Prim Dressing: KerraCel Ag Gelling Fiber Dressing, 4x5 in (silver alginate) (Generic) Every Other Day/15 Days ary Discharge Instructions: Apply silver alginate to wound bed as instructed Secured With: Paper Tape, 2x10 (in/yd) (Generic) Every Other Day/15 Days Discharge Instructions: Secure dressing with tape as directed. Compression Wrap: Kerlix Roll 4.5x3.1 (in/yd) (Generic) Every Other Day/15 Days Discharge Instructions: Apply Kerlix and Coban compression as directed. GLYCEMIA INTERVENTIONS PROTOCOL PRE-HBO GLYCEMIA INTERVENTIONS ACTION INTERVENTION Obtain pre-HBO capillary blood glucose  (ensure 1 physician order is in chart). A. Notify HBO physician and await physician orders. 2 If result is 70 mg/dl or below: B. If the result meets the hospital definition of a critical result, follow hospital policy. A. Give patient an 8 ounce Glucerna Shake, an 8 ounce Ensure, or 8 ounces of a Glucerna/Ensure equivalent dietary supplement*. B. Wait 30 minutes. If result is 71 mg/dl to 130 mg/dl: C. Retest patients capillary blood glucose (CBG). D. If result greater than or equal to 110 mg/dl, proceed with HBO. If result less than  110 mg/dl, notify HBO physician and consider holding HBO. If result is 131 mg/dl to 249 mg/dl: A. Proceed with HBO. A. Notify HBO physician and await physician orders. B. It is recommended to hold HBO and do If result is 250 mg/dl or greater: blood/urine ketone testing. C. If the result meets the hospital definition of a critical result, follow hospital policy. POST-HBO GLYCEMIA INTERVENTIONS ACTION INTERVENTION Obtain post HBO capillary blood glucose (ensure 1 physician order is in chart). A. Notify HBO physician and await physician orders. 2 If result is 70 mg/dl or below: B. If the result meets the hospital definition of a critical result, follow hospital policy. A. Give patient an 8 ounce Glucerna Shake, an 8 ounce Ensure, or 8 ounces of a Glucerna/Ensure equivalent dietary supplement*. B. Wait 15 minutes for symptoms of If result is 71 mg/dl to 100 mg/dl: hypoglycemia (i.e. nervousness, anxiety, sweating, chills, clamminess, irritability, confusion, tachycardia or dizziness). C. If patient asymptomatic, discharge patient. If patient symptomatic, repeat capillary blood glucose (CBG) and notify HBO physician. If result is 101 mg/dl to 249 mg/dl: A. Discharge patient. A. Notify HBO physician and await physician orders. B. It is recommended to do blood/urine ketone If result is 250 mg/dl or greater: testing. C. If the result  meets the hospital definition of a critical result, follow hospital policy. *Juice or candies are NOT equivalent products. If patient refuses the Glucerna or Ensure, please consult the hospital dietitian for an appropriate substitute. Electronic Signature(s) Signed: 09/09/2021 10:40:22 AM By: [name] MD FACS Entered By: [name] on 09/09/2021 10:39:12 -------------------------------------------------------------------------------- Problem List Details Patient Name: Date of Service: [name], [name] 09/09/2021 10:15 A M Medical Record Number: [id number] Patient Account Number: [id number] Date of Birth/Sex: Treating RN: 03-02-1970 (51 y.o. 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 09/09/2021 10:15 A M Medical Record Number: [id number] Patient Account Number: [id number] Date of Birth/Sex: Treating RN: [birth year] (51 y.o. 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 The most recent amputation was in August 2022. At that operation, antibiotic beads were placed in the wound. He has been managed by podiatry for his procedures and management of his wounds. He has been in a Water engineer. He is on oral doxycycline. They have been using Betadine wet to dry dressings along with Iodosorb. The patient states that when he thinks the wound is getting too dry, he applies topical Neosporin. At his last visit, on June 7 of this year, the podiatrist determined that he felt  the wound was stalled and referred him to wound care for additional evaluation and management. An MRI has been ordered, but not yet scheduled or performed. Pathology from his operation in August 2022 demonstrated findings consistent with chronic osteomyelitis. Today, there is a large irregular wound on the plantar surface of his right foot, at about the level of the fifth metatarsal head. This tracks through to a pinpoint opening on the dorsal lateral portion of his foot. The intake nurse reported purulent drainage. There is some malodor from the wound. No frank necrosis identified. 07/11/2021: Today, the wounds do not connect. I attempted multiple times from various directions and the shared tunnel is no longer open. He has some slough accumulation on the dorsal part of his foot as well as slough and callus buildup on the plantar surface. His MRI is scheduled for June 29. No purulent drainage or malodor appreciated today. 07/19/2021: The lateral foot wound has closed and there is no tunnel connecting the plantar foot wound to that site. The plantar foot wound still probes quite deeply, however. There is  some slough, eschar, and nonviable tissue accumulated in the wound bed. No malodorous drainage present. His MRI was performed yesterday and is consistent with fairly extensive osteomyelitis. 07/29/2021: The patient has an appointment with infectious disease on July 18 to treat his osteomyelitis. The plantar wound still probes quite deeply, approaching bone. The orifice has narrowed quite substantially, however, making it more difficult for him to pack. 08/05/2021: The tunnel connecting the lateral foot wound to the plantar foot wound has reopened. He sees infectious disease tomorrow to discuss long-term antibiotic treatment for osteomyelitis. There was a bit of murky drainage in the wound, but this was noted after he had had topical lidocaine applied so may have just been a blob of the anesthetic. No odor or frank purulent drainage. The wound probes deeply at the midfoot approaching bone. He does have MRI results confirming his diagnosis of osteomyelitis. He has failed to progress with conventional treatment and I think his best chance for preservation of the foot is to initiate hyperbaric oxygen therapy. 08/13/2021: The tunnel connecting the 2 wounds has closed again. He has a PICC line and is getting IV daptomycin and cefepime. EKG and chest x-ray are within normal limits. He is scheduled to start hyperbaric oxygen therapy tomorrow. The wound in his midfoot probes deeply, approaching bone. The skin at the orifice continues to heaped up and threatens to close over despite the large cavity within. No erythema, induration, or purulent drainage. The wound on his lateral foot is fairly small and quite clean. 08/19/2021: The lateral foot wound has nearly closed. The wound in his midfoot does not probe quite as deeply today. The skin at the orifice continues to try to roll in and obscure the opening. No frankly necrotic tissue appreciated. He is tolerating hyperbaric oxygen therapy. 08/26/2021: The lateral foot  wound has closed completely. The wound in his midfoot is shallower again today. The wound orifice is contracting. We are using gentamicin and silver alginate. He continues on IV daptomycin and cefepime and is tolerating hyperbaric oxygen therapy without difficulty. 09/02/2021: His foot is a little bit sore today but he was up walking on it all weekend doing back to school shopping with his daughter. The tunneling is down to about 3 cm and I do not appreciate bone at the tip of the probe. The wound is clean but has some callus creating an overhanging lip at the distal aspect. He  continues to receive hyperbaric oxygen therapy as well as IV daptomycin and cefepime. 09/09/2021: His wound continues to improve. The tunnel has come in by 0.9 cm. He continues to form callus around the orifice of the wound. He is tolerating hyperbaric oxygen therapy along with his IV antibiotics. Patient History Information obtained from Patient. Social History Never smoker, Marital Status - Married, Alcohol Use - Never, Drug Use - No History, Caffeine Use - Daily - T coffee. ea; Medical History Endocrine Patient has history of Type II Diabetes Hospitalization/Surgery History - Amuptation of 3rd,4th and 5th toes of Right foot;Oral Surgery;Anal Fissure surgery; Cholecystectomy. Objective Constitutional No acute distress.. Vitals Time Taken: 10:13 AM, Height: 72 in, Weight: 312 lbs, BMI: 42.3, Temperature: 97.2 F, Pulse: 67 bpm, Respiratory Rate: 16 breaths/min, Blood Pressure: 135/85 mmHg. Respiratory Normal work of breathing on room air.. General Notes: 09/09/2021: His wound continues to improve. The tunnel has come in by 0.9 cm. He continues to form callus around the orifice of the wound. Integumentary (Hair, Skin) Wound #2 status is Open. Original cause of wound was Gradually Appeared. The date acquired was: 12/19/2016. The wound has been in treatment 9 weeks. The wound is located on the [place].  The wound measures 0.3cm length x 1.9cm width x 2cm depth; 0.448cm^2 area and 0.895cm^3 volume. There is Fat Layer (Subcutaneous Tissue) exposed. There is undermining starting at 3:00 and ending at 9:00 with a maximum distance of 0.3cm. There is a medium amount of serosanguineous drainage noted. The wound margin is thickened. There is large (67-100%) red granulation within the wound bed. There is a small (1-33%) amount of necrotic tissue within the wound bed including Adherent Slough. 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 There was a Selective/Open Wound Non-Viable Tissue Debridement with a total area of 0.57 sq cm performed by [name], MD. With the following instrument(s): Curette to remove Non-Viable tissue/material. Material removed includes Callus and Other: skin after achieving pain control using Lidocaine 5% topical ointment. No specimens were taken. A time out was conducted at 10:33, prior to the start of the procedure. A Minimum amount of bleeding was controlled with Pressure. The procedure was tolerated well with a pain level of 0 throughout and a pain level of 0 following the procedure. Post Debridement Measurements: 0.3cm length x 1.9cm width x 2cm depth; 0.895cm^3 volume. Character of Wound/Ulcer Post Debridement is improved. Severity of Tissue Post Debridement is: Fat layer exposed. Post procedure Diagnosis Wound #2: Same as Pre-Procedure Plan Follow-up Appointments: Return Appointment in 1 week. - Dr Celine Ahr Room 3 Mon 8/28 @ 10:15 am after HBO Anesthetic: Wound #2  Right,Lateral,Plantar Foot: (In clinic) Topical Lidocaine 4% applied to wound bed Bathing/ Shower/ Hygiene: Other Bathing/Shower/Hygiene Orders/Instructions: - Change dressing after bathing Off-Loading: Other: - Try to not stand on your feet too much Hyperbaric Oxygen Therapy: Evaluate for HBO Therapy Indication: - wagner grade 3 diabetic foot ulcer right foot 2.0 ATA for 90 Minutes without Air Breaks T Number of Treatments: - 40 otal One treatments per day (delivered Monday through Friday unless otherwise specified in Special Instructions below): Finger stick Blood Glucose Pre- and Post- HBOT Treatment. Follow Hyperbaric Oxygen  Glycemia Protocol Afrin (Oxymetazoline HCL) 0.05% nasal spray - 1 spray in both nostrils daily as needed prior to HBO treatment for difficulty clearing ears WOUND #2: - Foot Wound Laterality: Plantar, Right, Lateral Cleanser: Soap and Water Every Other Day/15 Days Discharge Instructions: May shower and wash wound with dial antibacterial soap and water prior to dressing change. Cleanser: Wound Cleanser (Generic) Every Other Day/15 Days Discharge Instructions: Cleanse the wound with wound cleanser prior to applying a clean dressing using gauze sponges, not tissue or cotton balls. Peri-Wound Care: cotton tipped applicators (Generic) Every Other Day/15 Days Topical: Gentamicin Every Other Day/15 Days Discharge Instructions: As directed by physician Prim Dressing: KerraCel Ag Gelling Fiber Dressing, 4x5 in (silver alginate) (Generic) Every Other Day/15 Days ary Discharge Instructions: Apply silver alginate to wound bed as instructed Secured With: Paper T ape, 2x10 (in/yd) (Generic) Every Other Day/15 Days Discharge Instructions: Secure dressing with tape as directed. Com pression Wrap: Kerlix Roll 4.5x3.1 (in/yd) (Generic) Every Other Day/15 Days Discharge Instructions: Apply Kerlix and Coban compression as directed. 09/09/2021: His wound continues to improve. The  tunnel has come in by 0.9 cm. He continues to form callus around the orifice of the wound. I used a curette to debride callus and overhanging skin to keep the orifice of the wound open. We will continue to pack the wound with gentamicin and silver alginate. He will continue hyperbaric oxygen therapy and IV antibiotics per infectious disease. Follow-up in 1 week. Electronic Signature(s) Signed: 09/09/2021 10:39:43 AM By: [name] MD FACS Signed: 09/09/2021 10:39:43 AM By: [name] MD FACS Entered By: [name] on 09/09/2021 10:39:43 -------------------------------------------------------------------------------- HxROS Details Patient Name: Date of Service: [name], [name] 09/09/2021 10:15 A M Medical Record Number: [id number] Patient Account Number: [id number] Date of Birth/Sex: Treating RN: 21-Aug-1970 (51 y.o. [name] Primary Care Provider: [name] Other Clinician: Referring Provider: Treating Provider/Extender: [name] in Treatment: 9 Information Obtained From Patient Endocrine Medical History: Positive for: Type II Diabetes Immunizations Pneumococcal Vaccine: Received Pneumococcal Vaccination: Yes Received Pneumococcal Vaccination On or After 60th Birthday: No Implantable Devices None Hospitalization / Surgery History Type of Hospitalization/Surgery Amuptation of 3rd,4th and 5th toes of Right foot;Oral Surgery;Anal Fissure surgery; Cholecystectomy Family and Social History Never smoker; Marital Status - Married; Alcohol Use: Never; Drug Use: No History; Caffeine Use: Daily - T coffee; Financial Concerns: No; Food, ea; Clothing or Shelter Needs: No; Support System Lacking: No; Transportation Concerns: No Electronic Signature(s) Signed: 09/09/2021 10:40:22 AM By: [name] MD FACS Signed: 09/09/2021 11:34:12 AM By: Dellie Catholic RN Entered By: [name] on 09/09/2021  10:38:05 -------------------------------------------------------------------------------- SuperBill Details Patient Name: Date of Service: [name], [name] 09/09/2021 Medical Record Number: [id number] Patient Account Number: [id number] Date of Birth/Sex: Treating RN: 05-07-1970 (51 y.o. 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 Facility Procedures CPT4 Code: [id number] Description: [phone] - DEBRIDE WOUND 1ST 20 SQ CM OR < ICD-10 Diagnosis Description L97.514 Non-pressure chronic ulcer of other part of right foot with necrosis of bone Modifier: Quantity: 1 Physician Procedures : CPT4 Code Description Modifier [id number] - WC PHYS LEVEL 4 - EST PT 25 ICD-10 Diagnosis Description L97.514 Non-pressure  chronic ulcer of other part of right foot with necrosis of bone M86.671 Other chronic osteomyelitis, right ankle and foot  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

## 2021-09-09 NOTE — Progress Notes (Addendum)
[name], [name] ([id number]) Visit Report for 09/09/2021 Arrival Information Details Patient Name: Date of Service: [name], [name] [name] 09/09/2021 8:00 A M Medical Record Number: [id number] Patient Account Number: [id number] Date of Birth/Sex: Treating RN: 08-31-1970 (51 y.o. [name] Primary Care Solina Heron: [name] Other Clinician: [name] Referring Jameya Pontiff: Treating Boston Cookson/Extender: [name] in Treatment: 9 Visit Information History Since Last Visit All ordered tests and consults were completed: Yes Patient Arrived: Ambulatory Added or deleted any medications: No Arrival Time: 07:41 Any new allergies or adverse reactions: No Accompanied By: None Had a fall or experienced change in No Transfer Assistance: None activities of daily living that may affect Patient Identification Verified: Yes risk of falls: Secondary Verification Process Completed: Yes Signs or symptoms of abuse/neglect since last visito No Patient Requires Transmission-Based Precautions: No Hospitalized since last visit: No Patient Has Alerts: Yes Implantable device outside of the clinic excluding No Patient Alerts: ABI R 1.17 cellular tissue based products placed in the center ABI L 1.29 since last visit: R TBI 1.03 Pain Present Now: No L TBI 0.77 Electronic Signature(s) Signed: 09/09/2021 10:16:58 AM By: [name] EMT Entered By: [name] on 09/09/2021 10:16:58 -------------------------------------------------------------------------------- Encounter Discharge Information Details Patient Name: Date of Service: [name], [name] 09/09/2021 8:00 A M Medical Record Number: [id number] Patient Account Number: [id number] Date of Birth/Sex: Treating RN: [birth year] (51 y.o. [name] Primary Care Hayde Kilgour: [name] Other Clinician: [name] Referring Mayrin Schmuck: Treating Chiara Coltrin/Extender: [name]  in Treatment: 9 Encounter Discharge Information Items Discharge Condition: Stable Ambulatory Status: Ambulatory Discharge Destination: Home Transportation: Private Auto Accompanied By: None Schedule Follow-up Appointment: Yes Clinical Summary of Care: Electronic Signature(s) Signed: 09/09/2021 10:23:51 AM By: [name] EMT Entered By: [name] on 09/09/2021 10:23:51 -------------------------------------------------------------------------------- Vitals Details Patient Name: Date of Service: [name], [name] 09/09/2021 8:00 A M Medical Record Number: [id number] Patient Account Number: [id number] Date of Birth/Sex: Treating RN: 15-Oct-1970 (51 y.o. [name] Primary Care Sim Choquette: [name] Other Clinician: [name] Referring Bellagrace Sylvan: Treating Marcial Pless/Extender: [name] in Treatment: 9 Vital Signs Time Taken: 08:10 Temperature (F): 98.4 Height (in): 72 Pulse (bpm): 83 Weight (lbs): 312 Respiratory Rate (breaths/min): 16 Body Mass Index (BMI): 42.3 Blood Pressure (mmHg): 132/85 Capillary Blood Glucose (mg/dl): 132 Reference Range: 80 - 120 mg / dl Electronic Signature(s) Signed: 09/09/2021 10:17:28 AM By: [name] EMT Entered By: [name] on 09/09/2021 10:17:27

## 2021-09-10 ENCOUNTER — Encounter (HOSPITAL_BASED_OUTPATIENT_CLINIC_OR_DEPARTMENT_OTHER): Payer: BLUE CROSS/BLUE SHIELD | Admitting: Internal Medicine

## 2021-09-10 DIAGNOSIS — L97514 Non-pressure chronic ulcer of other part of right foot with necrosis of bone: Secondary | ICD-10-CM

## 2021-09-10 DIAGNOSIS — M86671 Other chronic osteomyelitis, right ankle and foot: Secondary | ICD-10-CM

## 2021-09-10 DIAGNOSIS — E11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ical complication: Secondary | ICD-10-CM | POA: Diagnosis not present

## 2021-09-10 LAB — GLUCOSE, CAPILLARY
Glucose-Capillary: 159 mg/dL — ABNORMAL HIGH (ref 70–99)
Glucose-Capillary: 141 mg/dL — ABNORMAL HIGH (ref 70–99)

## 2021-09-10 NOTE — Progress Notes (Addendum)
[name], [name] ([id number]) Visit Report for 09/10/2021 HBO Details Patient Name: Date of Service: [name], [place] [name] 09/10/2021 8:00 A M Medical Record Number: [id number] Patient Account Number: [id number] Date of Birth/Sex: Treating RN: [birth year] [name], [name] Primary Care Zeeshan Korte: [name] Other Clinician: [name] Referring Maalik Pinn: Treating [name]/Extender: [name] in Treatment: 9 HBO Treatment Course Details Treatment Course Number: 1 Ordering [name]: [name] T Treatments Ordered: otal 40 HBO Treatment Start Date: 08/14/2021 HBO Indication: Diabetic Ulcer(s) of the Lower Extremity Standard/Conservative Wound Care tried and failed greater than or equal to 30 days Wound #2 Right, Lateral, Plantar Foot , Wound #3R Right, Lateral Foot HBO Treatment Details Treatment Number: 20 Patient Type: Outpatient Chamber Type: Monoplace Chamber Serial #: [id number] Treatment Protocol: 2.0 ATA with 90 minutes oxygen, and no air breaks Treatment Details Compression Rate Down: 2.0 psi / minute De-Compression Rate Up: 2.0 psi / minute Air breaks and breathing Decompress Decompress Compress Tx Pressure Begins Reached periods Begins Ends (leave unused spaces blank) Chamber Pressure (ATA 1 2 ------2 1 ) Clock Time (24 hr) 08:28 08:37 - - - - - - 10:08 10:15 Treatment Length: 107 (minutes) Treatment Segments: 4 Vital Signs Capillary Blood Glucose Reference Range: 80 - 120 mg / dl HBO Diabetic Blood Glucose Intervention Range: <131 mg/dl or >249 mg/dl Time Vitals Blood Respiratory Capillary Blood Glucose Pulse Action Type: Pulse: Temperature: Taken: Pressure: Rate: Glucose (mg/dl): Meter #: Oximetry (%) Taken: Pre 08:16 132/80 93 16 98.2 159 Post 10:17 148/95 69 16 97.3 141 Treatment Response Treatment Toleration: Well Treatment Completion Status: Treatment Completed without Adverse Event Additional  Procedure Documentation Tissue Sevierity: Necrosis of bone Physician HBO Attestation: I certify that I supervised this HBO treatment in accordance with Medicare guidelines. A trained emergency response team is readily available per Yes hospital policies and procedures. Continue HBOT as ordered. Yes Electronic Signature(s) Signed: 09/10/2021 3:51:23 PM By: [name] DO Previous Signature: 09/10/2021 1:42:46 PM Version By: [name] EMT Entered By: [name] on 09/10/2021 15:22:16 -------------------------------------------------------------------------------- HBO Safety Checklist Details Patient Name: Date of Service: [name], [name] 09/10/2021 8:00 A M Medical Record Number: [id number] Patient Account Number: [id number] Date of Birth/Sex: Treating RN: [birth year] [name], [name] Primary Care Adriel Desrosier: [name] Other Clinician: [name] Referring [name]: Treating [name]/Extender: [name] in Treatment: 9 HBO Safety Checklist Items Safety Checklist Consent Form Signed Patient voided / foley secured and emptied When did you last eato 0700 Last dose of injectable or oral agent 0650 Ostomy pouch emptied and vented if applicable NA All implantable devices assessed, documented and approved PIC line Intravenous access site secured and place PIC Line Valuables secured Linens and cotton and cotton/polyester blend (less than 51% polyester) Personal oil-based products / skin lotions / body lotions removed Wigs or hairpieces removed NA Smoking or tobacco materials removed Books / newspapers / magazines / loose paper removed Cologne, aftershave, perfume and deodorant removed Jewelry removed (may wrap wedding band) NA Make-up removed Hair care products removed Battery operated devices (external) removed Heating patches and chemical warmers removed Titanium eyewear removed NA Nail polish cured greater  than 10 hours NA Casting material cured greater than 10 hours NA Hearing aids removed NA Loose dentures or partials removed NA Prosthetics have been removed NA Patient demonstrates correct use of air break device (if applicable) Patient concerns have been addressed Patient grounding bracelet on and cord attached to chamber Specifics  for Inpatients (complete in addition to above) Medication sheet sent with patient NA Intravenous medications needed or due during therapy sent with patient NA Drainage tubes (e.g. nasogastric tube or chest tube secured and vented) NA Endotracheal or Tracheotomy tube secured NA Cuff deflated of air and inflated with saline NA Airway suctioned NA Notes The safety checklist was done before the treatment was started Electronic Signature(s) Signed: 09/10/2021 1:41:10 PM By: [name] EMT Entered By: [name] on 09/10/2021 13:41:10

## 2021-09-10 NOTE — Progress Notes (Addendum)
[name], Khiree M. ([id number]) Visit Report for 09/10/2021 Arrival Information Details Patient Name: Date of Service: [place], [place] [name] 09/10/2021 8:00 A M Medical Record Number: [id number] Patient Account Number: [id number] Date of Birth/Sex: Treating RN: 04-08-1970 (51 y.o. [name], [name] Primary Care Mathea Frieling: [name] Other Clinician: [name] Referring Kenda Kloehn: Treating Kellyn Mansfield/Extender: [name] in Treatment: 9 Visit Information History Since Last Visit All ordered tests and consults were completed: Yes Patient Arrived: Ambulatory Added or deleted any medications: No Arrival Time: 07:49 Any new allergies or adverse reactions: No Accompanied By: None Had a fall or experienced change in No Transfer Assistance: None activities of daily living that may affect Patient Identification Verified: Yes risk of falls: Secondary Verification Process Completed: Yes Signs or symptoms of abuse/neglect since last visito No Patient Requires Transmission-Based Precautions: No Hospitalized since last visit: No Patient Has Alerts: Yes Implantable device outside of the clinic excluding No Patient Alerts: ABI R 1.17 cellular tissue based products placed in the center ABI L 1.29 since last visit: R TBI 1.03 Pain Present Now: No L TBI 0.77 Electronic Signature(s) Signed: 09/10/2021 1:39:28 PM By: [name] EMT Entered By: [name] on 09/10/2021 13:39:28 -------------------------------------------------------------------------------- Encounter Discharge Information Details Patient Name: Date of Service: [place], [name] 09/10/2021 8:00 A M Medical Record Number: [id number] Patient Account Number: [id number] Date of Birth/Sex: Treating RN: Sep 13, 1970 (51 y.o. [name], [name] Primary Care Brian Zeitlin: [name] Other Clinician: [name] Referring Holliday Sheaffer: Treating Deaven Urwin/Extender: [name] in Treatment: 9 Encounter Discharge Information Items Discharge Condition: Stable Ambulatory Status: Ambulatory Discharge Destination: Home Transportation: Private Auto Accompanied By: None Schedule Follow-up Appointment: Yes Clinical Summary of Care: Electronic Signature(s) Signed: 09/10/2021 1:43:47 PM By: [name] EMT Entered By: [name] on 09/10/2021 13:43:46 -------------------------------------------------------------------------------- Vitals Details Patient Name: Date of Service: [name], [name] 09/10/2021 8:00 A M Medical Record Number: [id number] Patient Account Number: [id number] Date of Birth/Sex: Treating RN: 02-14-1970 (51 y.o. [name], [name] Primary Care Sareen Randon: [name] Other Clinician: [name] Referring Deyonte Cadden: Treating Rainey Rodger/Extender: [name] in Treatment: 9 Vital Signs Time Taken: 08:16 Temperature (F): 98.2 Height (in): 72 Pulse (bpm): 93 Weight (lbs): 312 Respiratory Rate (breaths/min): 16 Body Mass Index (BMI): 42.3 Blood Pressure (mmHg): 132/80 Capillary Blood Glucose (mg/dl): 159 Reference Range: 80 - 120 mg / dl Electronic Signature(s) Signed: 09/10/2021 1:39:55 PM By: [name] EMT Entered By: [name] on 09/10/2021 13:39:55

## 2021-09-10 NOTE — Progress Notes (Signed)
[name], Shad M. ([id number]) Visit Report for 09/10/2021 Problem List Details Patient Name: Date of Service: [place], [place] [name] 09/10/2021 8:00 A M Medical Record Number: [id number] Patient Account Number: [id number] Date of Birth/Sex: Treating RN: 1970/12/09 (51 y.o. 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 09/10/2021 Medical Record Number: [id number] Patient Account Number: [id number] Date of Birth/Sex: Treating RN: 20-Feb-1970 (51 y.o. 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 LH29.924Non-pressure chronic ulcer of other part of right foot with necrosis  of bone M86.671 Other chronic osteomyelitis, right ankle and foot Modifier: Quantity: 4 Physician Procedures : CPT4 Code Description Modifier [id number] 62229- WC PHYS HYPERBARIC OXYGEN THERAPY ICD-10 Diagnosis Description 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

## 2021-09-11 ENCOUNTER — Encounter (HOSPITAL_BASED_OUTPATIENT_CLINIC_OR_DEPARTMENT_OTHER): Payer: BLUE CROSS/BLUE SHIELD | Admitting: General Surgery

## 2021-09-11 DIAGNOSIS — E11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ical complication: Secondary | ICD-10-CM | POA: Diagnosis not present

## 2021-09-11 LAB — GLUCOSE, CAPILLARY
Glucose-Capillary: 151 mg/dL — ABNORMAL HIGH (ref 70–99)
Glucose-Capillary: 158 mg/dL — ABNORMAL HIGH (ref 70–99)

## 2021-09-11 NOTE — Progress Notes (Addendum)
[name], [name] ([id number]) Visit Report for 09/11/2021 HBO Details Patient Name: Date of Service: [name], [place] [name] 09/11/2021 8:00 A M Medical Record Number: [id number] Patient Account Number: [id number] Date of Birth/Sex: Treating RN: 14-Mar-1970 [name] Session Primary Care [name]: [name] Other Clinician: Valeria [name] Referring [name]: Treating [name]/Extender: [name] in Treatment: 9 HBO Treatment Course Details Treatment Course Number: 1 Ordering [name]: [name] T Treatments Ordered: otal 40 HBO Treatment Start Date: 08/14/2021 HBO Indication: Diabetic Ulcer(s) of the Lower Extremity Standard/Conservative Wound Care tried and failed greater than or equal to 30 days Wound #2 Right, Lateral, Plantar Foot , Wound #3R Right, Lateral Foot HBO Treatment Details Treatment Number: 21 Patient Type: Outpatient Chamber Type: Monoplace Chamber Serial #: [id number] Treatment Protocol: 2.0 ATA with 90 minutes oxygen, and no air breaks Treatment Details Compression Rate Down: 2.0 psi / minute De-Compression Rate Up: 2.0 psi / minute Air breaks and breathing Decompress Decompress Compress Tx Pressure Begins Reached periods Begins Ends (leave unused spaces blank) Chamber Pressure (ATA 1 2 ------2 1 ) Clock Time (24 hr) 08:14 08:22 - - - - - - 09:52 10:00 Treatment Length: 106 (minutes) Treatment Segments: 4 Vital Signs Capillary Blood Glucose Reference Range: 80 - 120 mg / dl HBO Diabetic Blood Glucose Intervention Range: <131 mg/dl or >249 mg/dl Time Vitals Blood Respiratory Capillary Blood Glucose Pulse Action Type: Pulse: Temperature: Taken: Pressure: Rate: Glucose (mg/dl): Meter #: Oximetry (%) Taken: Pre 08:08 131/74 88 16 97.9 151 Post 10:04 134/89 74 16 97.1 158 Treatment Response Treatment Toleration: Well Treatment Completion Status: Treatment Completed without Adverse Event Additional Procedure  Documentation Tissue Sevierity: Necrosis of bone Physician HBO Attestation: I certify that I supervised this HBO treatment in accordance with Medicare guidelines. A trained emergency response team is readily available per Yes hospital policies and procedures. Continue HBOT as ordered. Yes Electronic Signature(s) Signed: 09/11/2021 11:08:55 AM By: [name] Maudlin MD FACS Previous Signature: 09/11/2021 10:29:13 AM Version By: [name] EMT Previous Signature: 09/11/2021 9:04:56 AM Version By: [name] EMT Previous Signature: 09/11/2021 9:04:56 AM Version By: [name] EMT Entered By: [name] on 09/11/2021 11:08:54 -------------------------------------------------------------------------------- HBO Safety Checklist Details Patient Name: Date of Service: [name], [name] 09/11/2021 8:00 A M Medical Record Number: [id number] Patient Account Number: [id number] Date of Birth/Sex: Treating RN: 1970-05-15 [name] Session Primary Care [name]: [name] Other Clinician: Valeria [name] Referring [name]: Treating [name]/Extender: [name] in Treatment: 9 HBO Safety Checklist Items Safety Checklist Consent Form Signed Patient voided / foley secured and emptied When did you last eato 0705 Last dose of injectable or oral agent 0655 Ostomy pouch emptied and vented if applicable NA All implantable devices assessed, documented and approved PIC line Intravenous access site secured and place PIC Line Valuables secured Linens and cotton and cotton/polyester blend (less than 51% polyester) Personal oil-based products / skin lotions / body lotions removed Wigs or hairpieces removed NA Smoking or tobacco materials removed Books / newspapers / magazines / loose paper removed Cologne, aftershave, perfume and deodorant removed Jewelry removed (may wrap wedding band) NA Make-up removed NA Hair care products  removed NA Battery operated devices (external) removed Heating patches and chemical warmers removed Titanium eyewear removed NA Nail polish cured greater than 10 hours NA Casting material cured greater than 10 hours NA Hearing aids removed NA Loose dentures or partials removed NA Prosthetics have been removed NA Patient demonstrates  correct use of air break device (if applicable) Patient concerns have been addressed Patient grounding bracelet on and cord attached to chamber Specifics for Inpatients (complete in addition to above) Medication sheet sent with patient NA Intravenous medications needed or due during therapy sent with patient NA Drainage tubes (e.g. nasogastric tube or chest tube secured and vented) NA Endotracheal or Tracheotomy tube secured NA Cuff deflated of air and inflated with saline NA Airway suctioned NA Notes The safety checklist was done before the treatment was started Electronic Signature(s) Signed: 09/11/2021 9:03:51 AM By: [name] EMT Entered By: [name] on 09/11/2021 09:03:50

## 2021-09-11 NOTE — Progress Notes (Addendum)
[name], [name] ([id number]) Visit Report for 09/11/2021 Arrival Information Details Patient Name: Date of Service: [name], [name] [place] [name] 09/11/2021 8:00 A M Medical Record Number: [id number] Patient Account Number: [id number] Date of Birth/Sex: Treating RN: [birth year] (51 y.o. [name] Leasha Goldberger: [name] Other Clinician: [name] Referring Vernon Maish: Treating Elizar Alpern/Extender: [name]: 9 Visit Information History Since Last Visit All ordered tests and consults were completed: Yes Patient Arrived: Ambulatory Added or deleted any medications: No Arrival Time: 07:42 Any new allergies or adverse reactions: No Accompanied By: None Had a fall or experienced change [name] No Transfer Assistance: None activities of daily living that may affect Patient Identification Verified: Yes risk of falls: Secondary Verification Process Completed: Yes Signs or symptoms of abuse/neglect since last visito No Patient Requires Transmission-Based Precautions: No Hospitalized since last visit: No Patient Has Alerts: Yes Implantable device outside of the clinic excluding No Patient Alerts: ABI R 1.17 cellular tissue based products placed [name] the center ABI L 1.29 since last visit: R TBI 1.03 Pain Present Now: No L TBI 0.77 Electronic Signature(s) Signed: 09/11/2021 9:01:44 AM By: [name] EMT Entered By: [name] on 09/11/2021 09:01:44 -------------------------------------------------------------------------------- Encounter Discharge Information Details Patient Name: Date of Service: [name], [name] 09/11/2021 8:00 A M Medical Record Number: [id number] Patient Account Number: [id number] Date of Birth/Sex: Treating RN: Feb 01, 1970 (51 y.o. [name] Haylin Camilli: [name] Other Clinician: [name] Referring Tayva Easterday: Treating Lamyia Cdebaca/Extender: [name]  Treatment: 9 Encounter Discharge Information Items Discharge Condition: Stable Ambulatory Status: Ambulatory Discharge Destination: Home Transportation: Private Auto Accompanied By: None Schedule Follow-up Appointment: Yes Clinical Summary of Care: Electronic Signature(s) Signed: 09/11/2021 10:31:40 AM By: [name] EMT Entered By: [name] on 09/11/2021 10:31:40 -------------------------------------------------------------------------------- Vitals Details Patient Name: Date of Service: [name], [name] 09/11/2021 8:00 A M Medical Record Number: [id number] Patient Account Number: [id number] Date of Birth/Sex: Treating RN: 10-24-1970 (51 y.o. [name] Majestic Brister: [name] Other Clinician: [name] Referring Jackalyn Haith: Treating Leveda Kendrix/Extender: [name]: 9 Vital Signs Time Taken: 08:08 Temperature (F): 97.9 Height ([name]: 72 Pulse (bpm): 88 Weight (lbs): 312 Respiratory Rate (breaths/min): 16 Body Mass Index (BMI): 42.3 Blood Pressure (mmHg): 131/74 Capillary Blood Glucose (mg/dl): 151 Reference Range: 80 - 120 mg / dl Electronic Signature(s) Signed: 09/11/2021 9:02:15 AM By: [name] EMT Entered By: [name] on 09/11/2021 09:02:15

## 2021-09-11 NOTE — Progress Notes (Signed)
[name], Kingsten M. ([id number]) Visit Report for 09/11/2021 Problem List Details Patient Name: Date of Service: [name], [place] [name] 09/11/2021 8:00 A M Medical Record Number: [id number] Patient Account Number: [id number] Date of Birth/Sex: Treating RN: August 02, 1970 (51 y.o. 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 09/11/2021 Medical Record Number: [id number] Patient Account Number: [id number] Date of Birth/Sex: Treating RN: [birth year] (51 y.o. 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 LG31.517Non-pressure chronic ulcer of other part of right foot with necrosis  of bone M86.671 Other chronic osteomyelitis, right ankle and foot Modifier: Quantity: 4 Physician Procedures : CPT4 Code Description Modifier [id number] 10626- WC PHYS HYPERBARIC OXYGEN THERAPY ICD-10 Diagnosis Description 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

## 2021-09-12 ENCOUNTER — Telehealth: Payer: Self-pay | Admitting: Family Medicine

## 2021-09-12 ENCOUNTER — Encounter (HOSPITAL_BASED_OUTPATIENT_CLINIC_OR_DEPARTMENT_OTHER): Payer: BLUE CROSS/BLUE SHIELD | Admitting: General Surgery

## 2021-09-12 DIAGNOSIS — E11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ical complication: Secondary | ICD-10-CM | POA: Diagnosis not present

## 2021-09-12 LAB — GLUCOSE, CAPILLARY
Glucose-Capillary: 137 mg/dL — ABNORMAL HIGH (ref 70–99)
Glucose-Capillary: 129 mg/dL — ABNORMAL HIGH (ref 70–99)

## 2021-09-12 NOTE — Progress Notes (Addendum)
[name], Issiah M. ([id number]) Visit Report for 09/12/2021 HBO Details Patient Name: Date of Service: [name], [place] [name] 09/12/2021 8:00 A M Medical Record Number: [id number] Patient Account Number: [id number] Date of Birth/Sex: Treating RN: March 05, 1970 (51 y.o. [name] Primary Care Lariyah Shetterly: [name] Other Clinician: [name] Referring Taha Dimond: Treating Sabella Traore/Extender: [name] in Treatment: 10 HBO Treatment Course Details Treatment Course Number: 1 Ordering Neah Sporrer: [name] T Treatments Ordered: otal 40 HBO Treatment Start Date: 08/14/2021 HBO Indication: Diabetic Ulcer(s) of the Lower Extremity Standard/Conservative Wound Care tried and failed greater than or equal to 30 days Wound #2 Right, Lateral, Plantar Foot , Wound #3R Right, Lateral Foot HBO Treatment Details Treatment Number: 22 Patient Type: Outpatient Chamber Type: Monoplace Chamber Serial #: [id number] Treatment Protocol: 2.0 ATA with 90 minutes oxygen, and no air breaks Treatment Details Air breaks and breathing Decompress Decompress Compress Tx Pressure Begins Reached periods Begins Ends (leave unused spaces blank) Chamber Pressure (ATA 1 2 ------2 1 ) Clock Time (24 hr) 08:21 08:29 - - - - - - 09:59 10:07 Treatment Length: 106 (minutes) Treatment Segments: 4 Vital Signs Capillary Blood Glucose Reference Range: 80 - 120 mg / dl HBO Diabetic Blood Glucose Intervention Range: <131 mg/dl or >249 mg/dl Time Vitals Blood Respiratory Capillary Blood Glucose Pulse Action Type: Pulse: Temperature: Taken: Pressure: Rate: Glucose (mg/dl): Meter #: Oximetry (%) Taken: Pre 08:18 126/79 78 16 97.6 137 Post 10:12 135/94 71 16 97.2 129 Treatment Response Treatment Toleration: Well Treatment Completion Status: Treatment Completed without Adverse Event Additional Procedure Documentation Tissue Sevierity: Necrosis of bone Physician HBO Attestation: I  certify that I supervised this HBO treatment in accordance with Medicare guidelines. A trained emergency response team is readily available per Yes hospital policies and procedures. Continue HBOT as ordered. Yes Electronic Signature(s) Signed: 09/12/2021 12:48:21 PM By: [name] MD FACS Previous Signature: 09/12/2021 10:46:29 AM Version By: [name] EMT Previous Signature: 09/12/2021 9:24:31 AM Version By: [name] EMT Entered By: [name] on 09/12/2021 12:48:20 -------------------------------------------------------------------------------- HBO Safety Checklist Details Patient Name: Date of Service: [name], [name] 09/12/2021 8:00 A M Medical Record Number: [id number] Patient Account Number: [id number] Date of Birth/Sex: Treating RN: February 21, 1970 (51 y.o. [name] Primary Care Kees Idrovo: [name] Other Clinician: [name] Referring Lynasia Meloche: Treating Larin Weissberg/Extender: [name] in Treatment: 10 HBO Safety Checklist Items Safety Checklist Consent Form Signed Patient voided / foley secured and emptied When did you last eato 0705 Last dose of injectable or oral agent 0635 Ostomy pouch emptied and vented if applicable NA All implantable devices assessed, documented and approved NA Intravenous access site secured and place PICC Valuables secured PICC Linens and cotton and cotton/polyester blend (less than 51% polyester) Personal oil-based products / skin lotions / body lotions removed Wigs or hairpieces removed NA Smoking or tobacco materials removed Books / newspapers / magazines / loose paper removed Cologne, aftershave, perfume and deodorant removed Jewelry removed (may wrap wedding band) NA Make-up removed NA Hair care products removed Battery operated devices (external) removed Heating patches and chemical warmers removed Titanium eyewear removed NA Nail polish cured greater than 10  hours NA Casting material cured greater than 10 hours NA Hearing aids removed NA Loose dentures or partials removed NA Prosthetics have been removed NA Patient demonstrates correct use of air break device (if applicable) Patient concerns have been addressed Patient grounding bracelet on and cord attached to chamber Specifics for Inpatients (complete  in addition to above) Medication sheet sent with patient NA Intravenous medications needed or due during therapy sent with patient NA Drainage tubes (e.g. nasogastric tube or chest tube secured and vented) NA Endotracheal or Tracheotomy tube secured NA Cuff deflated of air and inflated with saline NA Airway suctioned NA Notes The safety checklist was done before the treatment was started Electronic Signature(s) Signed: 09/12/2021 9:23:26 AM By: [name] EMT Entered By: [name] on 09/12/2021 09:23:26

## 2021-09-12 NOTE — Telephone Encounter (Signed)
Pt received letter from [facility] regarding rx. They are stating they are needing more clinical info as to why pt was switched from vials to pens before his refills are due again. Please advise  MEDICATION: insulin aspart (NOVOLOG) 100 UNIT/ML FlexPen  insulin degludec (TRESIBA FLEXTOUCH) 200 UNIT/ML FlexTouch Pen

## 2021-09-12 NOTE — Progress Notes (Signed)
[name], [name] ([id number]) Visit Report for 09/12/2021 Problem List Details Patient Name: Date of Service: [name], [name] 09/12/2021 8:00 A M Medical Record Number: [id number] Patient Account Number: [id number] Date of Birth/Sex: Treating RN: 08/12/1970 (51 y.o. 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 09/12/2021 Medical Record Number: [id number] Patient Account Number: [id number] Date of Birth/Sex: Treating RN: 1970/02/17 (51 y.o. 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 LM19.622Non-pressure chronic ulcer of other part of right foot with necrosis  of bone M86.671 Other chronic osteomyelitis, right ankle and foot Modifier: Quantity: 4 Physician Procedures : CPT4 Code Description Modifier [id number] 11941- WC PHYS HYPERBARIC OXYGEN THERAPY ICD-10 Diagnosis Description 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

## 2021-09-12 NOTE — Progress Notes (Addendum)
[name], [name] ([id number]) Visit Report for 09/12/2021 Arrival Information Details Patient Name: Date of Service: [place], [place] [name] 09/12/2021 8:00 A M Medical Record Number: [id number] Patient Account Number: [id number] Date of Birth/Sex: Treating RN: [birth year] [name] Primary Care [name]: [name] Other Clinician: [name] Referring Shaindel Sweeten: Treating [name]/Extender: [name] in Treatment: 10 Visit Information History Since Last Visit All ordered tests and consults were completed: Yes Patient Arrived: Ambulatory Added or deleted any medications: No Arrival Time: 07:38 Any new allergies or adverse reactions: No Accompanied By: None Had a fall or experienced change in No Transfer Assistance: None activities of daily living that may affect Patient Identification Verified: Yes risk of falls: Secondary Verification Process Completed: Yes Signs or symptoms of abuse/neglect since last visito No Patient Requires Transmission-Based Precautions: No Hospitalized since last visit: No Patient Has Alerts: Yes Implantable device outside of the clinic excluding No Patient Alerts: ABI R 1.17 cellular tissue based products placed in the center ABI L 1.29 since last visit: R TBI 1.03 Pain Present Now: No L TBI 0.77 Electronic Signature(s) Signed: 09/12/2021 9:21:23 AM By: [name] EMT Entered By: [name] on 09/12/2021 09:21:23 -------------------------------------------------------------------------------- Encounter Discharge Information Details Patient Name: Date of Service: [place], [name] 09/12/2021 8:00 A M Medical Record Number: [id number] Patient Account Number: [id number] Date of Birth/Sex: Treating RN: 1970-02-20 [name] Primary Care [name]: [name] Other Clinician: [name] Referring Granger Chui: Treating [name]/Extender: [name]  in Treatment: 10 Encounter Discharge Information Items Discharge Condition: Stable Ambulatory Status: Ambulatory Discharge Destination: Home Transportation: Private Auto Accompanied By: None Schedule Follow-up Appointment: Yes Clinical Summary of Care: Electronic Signature(s) Signed: 09/12/2021 10:47:35 AM By: [name] EMT Entered By: [name] on 09/12/2021 10:47:35 -------------------------------------------------------------------------------- Vitals Details Patient Name: Date of Service: [name], [name] 09/12/2021 8:00 A M Medical Record Number: [id number] Patient Account Number: [id number] Date of Birth/Sex: Treating RN: November 19, 1970 [name] Primary Care [name]: [name] Other Clinician: [name] Referring Hristopher Missildine: Treating [name]/Extender: [name] in Treatment: 10 Vital Signs Time Taken: 08:18 Temperature (F): 97.6 Height (in): 72 Pulse (bpm): 78 Weight (lbs): 312 Respiratory Rate (breaths/min): 16 Body Mass Index (BMI): 42.3 Blood Pressure (mmHg): 126/79 Capillary Blood Glucose (mg/dl): 137 Reference Range: 80 - 120 mg / dl Electronic Signature(s) Signed: 09/12/2021 9:21:50 AM By: [name] EMT Entered By: [name] on 09/12/2021 09:21:49

## 2021-09-12 NOTE — Telephone Encounter (Signed)
Called and spoke with [facility] and they did not need any information from [place], the PA went through and the Flextouch Pen was approved.

## 2021-09-13 ENCOUNTER — Encounter (HOSPITAL_BASED_OUTPATIENT_CLINIC_OR_DEPARTMENT_OTHER): Payer: BLUE CROSS/BLUE SHIELD | Admitting: Internal Medicine

## 2021-09-13 DIAGNOSIS — E11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ical complication: Secondary | ICD-10-CM

## 2021-09-13 DIAGNOSIS — M86671 Other chronic osteomyelitis, right ankle and foot: Secondary | ICD-10-CM

## 2021-09-13 DIAGNOSIS — L97514 Non-pressure chronic ulcer of other part of right foot with necrosis of bone: Secondary | ICD-10-CM | POA: Diagnosis not present

## 2021-09-13 LAB — GLUCOSE, CAPILLARY
Glucose-Capillary: 128 mg/dL — ABNORMAL HIGH (ref 70–99)
Glucose-Capillary: 147 mg/dL — ABNORMAL HIGH (ref 70–99)

## 2021-09-13 NOTE — Progress Notes (Signed)
[name], [name] ([id number]) Visit Report for 09/13/2021 HBO Details Patient Name: Date of Service: [name], [place] [name] 09/13/2021 8:00 A M Medical Record Number: [id number] Patient Account Number: [id number] Date of Birth/Sex: Treating RN: November 26, 1970 [name], [name] Primary Care Zion Lint: [name] Other Clinician: [name] Referring [name]: Treating Javon Snee/Extender: [name] in Treatment: 10 HBO Treatment Course Details Treatment Course Number: 1 Ordering Audyn Dimercurio: [name] T Treatments Ordered: otal 40 HBO Treatment Start Date: 08/14/2021 HBO Indication: Diabetic Ulcer(s) of the Lower Extremity Standard/Conservative Wound Care tried and failed greater than or equal to 30 days Wound #2 Right, Lateral, Plantar Foot , Wound #3R Right, Lateral Foot HBO Treatment Details Treatment Number: 23 Patient Type: Outpatient Chamber Type: Monoplace Chamber Serial #: [id number] Treatment Protocol: 2.0 ATA with 90 minutes oxygen, and no air breaks Treatment Details Compression Rate Down: 2.0 psi / minute De-Compression Rate Up: Air breaks and breathing Decompress Decompress Compress Tx Pressure Begins Reached periods Begins Ends (leave unused spaces blank) Chamber Pressure (ATA 1 2 ------2 1 ) Clock Time (24 hr) 07:58 08:08 - - - - - - 09:38 09:48 Treatment Length: 110 (minutes) Treatment Segments: 4 Vital Signs Capillary Blood Glucose Reference Range: 80 - 120 mg / dl HBO Diabetic Blood Glucose Intervention Range: <131 mg/dl or >249 mg/dl Type: Time Vitals Blood Pulse: Respiratory Capillary Blood Glucose Pulse Action Temperature: Taken: Pressure: Rate: Glucose (mg/dl): Meter #: Oximetry (%) Taken: Pre 07:53 138/87 80 16 98.8 128 Patient given 8 oz glucerna Post 09:51 162/79 90 14 97.2 147 Treatment Response Treatment Toleration: Well Treatment Completion Status: Treatment Completed without Adverse Event Treatment  Notes Spoke with [name] ref the patient blood sugar. [name] ordered given 8 oz Glucerna and start treatment. Additional Procedure Documentation Tissue Sevierity: Necrosis of bone Physician HBO Attestation: I certify that I supervised this HBO treatment in accordance with Medicare guidelines. A trained emergency response team is readily available per Yes hospital policies and procedures. Continue HBOT as ordered. Yes Electronic Signature(s) Signed: 09/16/2021 10:13:31 AM By: [name] DO Previous Signature: 09/13/2021 11:43:44 AM Version By: [name] EMT Previous Signature: 09/13/2021 8:51:53 AM Version By: [name] EMT Entered By: [name] on 09/16/2021 10:11:23 -------------------------------------------------------------------------------- HBO Safety Checklist Details Patient Name: Date of Service: [name], [name] 09/13/2021 8:00 A M Medical Record Number: [id number] Patient Account Number: [id number] Date of Birth/Sex: Treating RN: 1970-08-30 [name], [name] Primary Care Jakyiah Briones: [name] Other Clinician: [name] Referring Hortensia Duffin: Treating Victorine Mcnee/Extender: [name] in Treatment: 10 HBO Safety Checklist Items Safety Checklist Consent Form Signed Patient voided / foley secured and emptied When did you last eato 0700 Last dose of injectable or oral agent 0645 Ostomy pouch emptied and vented if applicable NA All implantable devices assessed, documented and approved PICC line Intravenous access site secured and place PICC Line Valuables secured Linens and cotton and cotton/polyester blend (less than 51% polyester) Personal oil-based products / skin lotions / body lotions removed Wigs or hairpieces removed NA Smoking or tobacco materials removed Books / newspapers / magazines / loose paper removed Cologne, aftershave, perfume and deodorant removed Jewelry removed (may wrap wedding  band) NA Make-up removed NA Hair care products removed Battery operated devices (external) removed Heating patches and chemical warmers removed Titanium eyewear removed NA Nail polish cured greater than 10 hours NA Casting material cured greater than 10 hours NA Hearing aids removed NA Loose dentures or  partials removed NA Prosthetics have been removed NA Patient demonstrates correct use of air break device (if applicable) Patient concerns have been addressed Patient grounding bracelet on and cord attached to chamber Specifics for Inpatients (complete in addition to above) Medication sheet sent with patient NA Intravenous medications needed or due during therapy sent with patient NA Drainage tubes (e.g. nasogastric tube or chest tube secured and vented) NA Endotracheal or Tracheotomy tube secured NA Cuff deflated of air and inflated with saline NA Airway suctioned NA Notes The safety checklist was done before the treatment was started Electronic Signature(s) Signed: 09/13/2021 9:39:49 AM By: [name] EMT Previous Signature: 09/13/2021 8:49:19 AM Version By: [name] EMT Entered By: [name] on 09/13/2021 09:39:49

## 2021-09-13 NOTE — Progress Notes (Addendum)
[name], [name] ([id number]) Visit Report for 09/13/2021 Arrival Information Details Patient Name: Date of Service: [place], [place] [name] 09/13/2021 8:00 A M Medical Record Number: [id number] Patient Account Number: [id number] Date of Birth/Sex: Treating RN: 1970-04-14 (51 y.o. M) Primary Care Brandye Inthavong: [name] Other Clinician: Referring Jazline Cumbee: Treating Recardo Linn/Extender: [name] in Treatment: 10 Visit Information History Since Last Visit All ordered tests and consults were completed: Yes Patient Arrived: Ambulatory Added or deleted any medications: No Arrival Time: 07:37 Any new allergies or adverse reactions: No Accompanied By: None Had a fall or experienced change in No Transfer Assistance: None activities of daily living that may affect Patient Identification Verified: Yes risk of falls: Secondary Verification Process Completed: Yes Signs or symptoms of abuse/neglect since last visito No Patient Requires Transmission-Based Precautions: No Hospitalized since last visit: No Patient Has Alerts: Yes Implantable device outside of the clinic excluding No cellular tissue based products placed in the center since last visit: Pain Present Now: No Electronic Signature(s) Signed: 09/13/2021 8:46:03 AM By: [name] EMT Entered By: [name] on 09/13/2021 08:46:03 -------------------------------------------------------------------------------- Encounter Discharge Information Details Patient Name: Date of Service: [place], [name] 09/13/2021 8:00 A M Medical Record Number: [id number] Patient Account Number: [id number] Date of Birth/Sex: Treating RN: [birth year] (51 y.o. [name] Primary Care Samyria Rudie: [name] Other Clinician: [name] Referring Scarlettrose Costilow: Treating Hester Forget/Extender: [name] in Treatment: 10 Encounter Discharge Information Items Discharge Condition:  Stable Ambulatory Status: Ambulatory Discharge Destination: Home Transportation: Private Auto Accompanied By: None Schedule Follow-up Appointment: Yes Clinical Summary of Care: Electronic Signature(s) Signed: 09/13/2021 11:44:54 AM By: [name] EMT Entered By: [name] on 09/13/2021 11:44:53 -------------------------------------------------------------------------------- Vitals Details Patient Name: Date of Service: [name], [name] 09/13/2021 8:00 A M Medical Record Number: [id number] Patient Account Number: [id number] Date of Birth/Sex: Treating RN: 11-Jul-1970 (51 y.o. [name], [name] Primary Care Stephene Alegria: [name] Other Clinician: [name] Referring Ashtyn Freilich: Treating Tavaughn Silguero/Extender: [name] in Treatment: 10 Vital Signs Time Taken: 07:53 Temperature (F): 98.8 Height (in): 72 Pulse (bpm): 80 Weight (lbs): 312 Respiratory Rate (breaths/min): 16 Body Mass Index (BMI): 42.3 Blood Pressure (mmHg): 138/87 Capillary Blood Glucose (mg/dl): 128 Reference Range: 80 - 120 mg / dl Electronic Signature(s) Signed: 09/13/2021 8:46:54 AM By: [name] EMT Entered By: [name] on 09/13/2021 08:46:54

## 2021-09-16 ENCOUNTER — Encounter (HOSPITAL_BASED_OUTPATIENT_CLINIC_OR_DEPARTMENT_OTHER): Payer: BLUE CROSS/BLUE SHIELD | Admitting: General Surgery

## 2021-09-16 ENCOUNTER — Encounter (HOSPITAL_BASED_OUTPATIENT_CLINIC_OR_DEPARTMENT_OTHER): Payer: BLUE CROSS/BLUE SHIELD | Admitting: Internal Medicine

## 2021-09-16 DIAGNOSIS — E11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ical complication: Secondary | ICD-10-CM | POA: Diagnosis not present

## 2021-09-16 DIAGNOSIS — L97514 Non-pressure chronic ulcer of other part of right foot with necrosis of bone: Secondary | ICD-10-CM | POA: Diagnosis not present

## 2021-09-16 DIAGNOSIS — M86671 Other chronic osteomyelitis, right ankle and foot: Secondary | ICD-10-CM

## 2021-09-16 LAB — GLUCOSE, CAPILLARY
Glucose-Capillary: 212 mg/dL — ABNORMAL HIGH (ref 70–99)
Glucose-Capillary: 201 mg/dL — ABNORMAL HIGH (ref 70–99)

## 2021-09-16 NOTE — Progress Notes (Signed)
[name], [name] ([id number]) Visit Report for 09/16/2021 Arrival Information Details Patient Name: Date of Service: [name], [name] 09/16/2021 10:15 A M Medical Record Number: [id number] Patient Account Number: [id number] Date of Birth/Sex: Treating RN: 02-Jul-1970 [name] Primary Care Leanndra Pember: [name] Other Clinician: Referring Rayansh Herbst: Treating Kajal Scalici/Extender: [name] in Treatment: 10 Visit Information History Since Last Visit Added or deleted any medications: No Patient Arrived: Ambulatory Any new allergies or adverse reactions: No Arrival Time: 10:17 Had a fall or experienced change in No Accompanied By: self activities of daily living that may affect Transfer Assistance: None risk of falls: Patient Identification Verified: Yes Signs or symptoms of abuse/neglect since last visito No Patient Requires Transmission-Based Precautions: No Hospitalized since last visit: No Patient Has Alerts: Yes Implantable device outside of the clinic excluding No cellular tissue based products placed in the center since last visit: Has Dressing in Place as Prescribed: Yes Pain Present Now: No Electronic Signature(s) Signed: 09/16/2021 11:10:10 AM By: Dellie Catholic RN Entered By: Dellie Catholic on 09/16/2021 10:17:36 -------------------------------------------------------------------------------- Encounter Discharge Information Details Patient Name: Date of Service[name] 09/16/2021 10:15 A M Medical Record Number: [id number] Patient Account Number: [id number] Date of Birth/Sex: Treating RN: February 14, 1970 [name] Primary Care [name]: [name] Other Clinician: Referring [name]: Treating [name]/Extender: [name] in Treatment: 10 Encounter Discharge Information Items Post Procedure Vitals Discharge Condition: Stable Temperature (F): 97.3 Ambulatory  Status: Ambulatory Pulse (bpm): 74 Discharge Destination: Home Respiratory Rate (breaths/min): 12 Transportation: Private Auto Blood Pressure (mmHg): 169/99 Accompanied By: self Schedule Follow-up Appointment: Yes Clinical Summary of Care: Patient Declined Electronic Signature(s) Signed: 09/16/2021 11:10:10 AM By: Dellie Catholic RN Entered By: Dellie Catholic on 09/16/2021 11:09:41 -------------------------------------------------------------------------------- Lower Extremity Assessment Details Patient Name: Date of Service[name] 09/16/2021 10:15 A M Medical Record Number: [id number] Patient Account Number: [id number] Date of Birth/Sex: Treating RN: [birth year] [name] Primary Care [name]: [name] Other Clinician: Referring [name]: Treating Becker Christopher/Extender: [name] in Treatment: 10 Edema Assessment Assessed: [name]: No] [name]: No] Edema: [Left: N] [Right: o] Calf Left: Right: Point of Measurement: From Medial Instep 37.5 cm Ankle Left: Right: Point of Measurement: From Medial Instep 24 cm Electronic Signature(s) Signed: 09/16/2021 11:10:10 AM By: Dellie Catholic RN Entered By: Dellie Catholic on 09/16/2021 10:24:21 -------------------------------------------------------------------------------- Multi Wound Chart Details Patient Name: Date of [name] 09/16/2021 10:15 A M Medical Record Number: [id number] Patient Account Number: [id number] Date of Birth/Sex: Treating RN: Aug 12, 1970 [name] Primary Care [name]: [name] Other Clinician: Referring [name]: Treating [name]/Extender: [name] in Treatment: 10 Vital Signs Height(in): 72 Capillary Blood Glucose(mg/dl): 201 Weight(lbs): 312 Pulse(bpm): 76 Body Mass Index(BMI): 42.3 Blood Pressure(mmHg): 169/99 Temperature(F): 97.3 Respiratory Rate(breaths/min):  12 Photos: [N/A:N/A] Right, Lateral, Plantar Foot N/A N/A Wound Location: Gradually Appeared N/A N/A Wounding Event: Diabetic Wound/Ulcer of the Lower N/A N/A Primary Etiology: Extremity Type II Diabetes N/A N/A Comorbid History: 12/19/2016 N/A N/A Date Acquired: 10 N/A N/A Weeks of Treatment: Open N/A N/A Wound Status: No N/A N/A Wound Recurrence: 0.3x1.5x0.5 N/A N/A Measurements L x W x D (cm) 0.353 N/A N/A A (cm) : rea 0.177 N/A N/A Volume (cm) : 90.20% N/A N/A % Reduction in A rea: 99.00% N/A N/A % Reduction in Volume: 3 Starting Position 1 (o'clock): 5 Ending Position 1 (o'clock): 0.2  Maximum Distance 1 (cm): Yes N/A N/A Undermining: Grade 3 N/A N/A Classification: Medium N/A N/A Exudate A mount: Serosanguineous N/A N/A Exudate Type: red, brown N/A N/A Exudate Color: Thickened N/A N/A Wound Margin: Small (1-33%) N/A N/A Granulation A mount: Pink N/A N/A Granulation Quality: Large (67-100%) N/A N/A Necrotic A mount: Eschar, Adherent Slough N/A N/A Necrotic Tissue: Fat Layer (Subcutaneous Tissue): Yes N/A N/A Exposed Structures: Fascia: No Tendon: No Muscle: No Joint: No Bone: No Small (1-33%) N/A N/A Epithelialization: Debridement - Excisional N/A N/A Debridement: Pre-procedure Verification/Time Out 10:42 N/A N/A Taken: Lidocaine 5% topical ointment N/A N/A Pain Control: Callus, Subcutaneous, Slough N/A N/A Tissue Debrided: Skin/Subcutaneous Tissue N/A N/A Level: 0.45 N/A N/A Debridement A (sq cm): rea Curette N/A N/A Instrument: Minimum N/A N/A Bleeding: Pressure N/A N/A Hemostasis A chieved: 0 N/A N/A Procedural Pain: 0 N/A N/A Post Procedural Pain: Procedure was tolerated well N/A N/A Debridement Treatment Response: 0.3x1.5x0.5 N/A N/A Post Debridement Measurements L x W x D (cm) 0.177 N/A N/A Post Debridement Volume: (cm) Debridement N/A N/A Procedures Performed: Treatment Notes Electronic  Signature(s) Signed: 09/16/2021 10:45:51 AM By: [name] MD FACS Signed: 09/16/2021 11:10:10 AM By: Dellie Catholic RN Entered By: [name] on 09/16/2021 10:45:51 -------------------------------------------------------------------------------- Multi-Disciplinary Care Plan Details Patient Name: [name] 09/16/2021 10:15 A M Medical Record Number: [id number] Patient Account Number: [id number] Date of Birth/Sex: Treating RN: [birth year] [name] Primary Care Cyla Haluska: [name] Other Clinician: Referring Jorene Kaylor: Treating Chandrea Zellman/Extender: [name] in Treatment: [place] reviewed with physician Active Inactive Nutrition Nursing Diagnoses: Impaired glucose control: actual or potential Potential for alteratiion in Nutrition/Potential for imbalanced nutrition Goals: Patient/caregiver will maintain therapeutic glucose control Date Initiated: 07/29/2021 Target Resolution Date: 11/19/2021 Goal Status: Active Interventions: Assess patient nutrition upon admission and as needed per policy Provide education on elevated blood sugars and impact on wound healing Treatment Activities: Patient referred to Primary Care Physician for further nutritional evaluation : 07/29/2021 Notes: Osteomyelitis Nursing Diagnoses: Infection: osteomyelitis Knowledge deficit related to disease process and management Goals: Patient/caregiver will verbalize understanding of disease process and disease management Date Initiated: 07/29/2021 Target Resolution Date: 11/19/2021 Goal Status: Active Patient's osteomyelitis will resolve Date Initiated: 07/29/2021 Target Resolution Date: 11/19/2021 Goal Status: Active Interventions: Assess for signs and symptoms of osteomyelitis resolution every visit Provide education on osteomyelitis Treatment Activities: Consult for HBO :  07/29/2021 Notes: Wound/Skin Impairment Nursing Diagnoses: Impaired tissue integrity Goals: Patient/caregiver will verbalize understanding of skin care regimen Date Initiated: 07/04/2021 Target Resolution Date: 11/19/2021 Goal Status: Active Interventions: Assess ulceration(s) every visit Treatment Activities: Skin care regimen initiated : 07/04/2021 Notes: Electronic Signature(s) Signed: 09/16/2021 11:10:10 AM By: Dellie Catholic RN Entered By: Dellie Catholic on 09/16/2021 10:57:17 -------------------------------------------------------------------------------- Pain Assessment Details Patient Name: Date of [name] 09/16/2021 10:15 A M Medical Record Number: [id number] Patient Account Number: [id number] Date of Birth/Sex: Treating RN: 21-Jul-1970 [name] Primary Care Gerda Yin: [name] Other Clinician: Referring Sonora Catlin: Treating Aydon Swamy/Extender: [name] in Treatment: 10 Active Problems Location of Pain Severity and Description of Pain Patient Has Paino No Site Locations Pain Management and Medication Current Pain Management: Electronic Signature(s) Signed: 09/16/2021 11:10:10 AM By: Dellie Catholic RN Entered By: Dellie Catholic on 09/16/2021 10:24:14 -------------------------------------------------------------------------------- Patient/Caregiver Education Details Patient Name: Date of Service: [place], [name] 8/28/2023andnbsp10:15 A M Medical Record Number: [id number] Patient Account Number: [id number] Date  of Birth/Gender: Treating RN: 1970-04-27 [name] Primary Care Physician: [name] Other Clinician: Referring Physician: Treating Physician/Extender: [name] in Treatment: 10 Education Assessment Education Provided To: Patient Education Topics Provided Elevated Blood Sugar/ Impact on Healing: Methods:  Explain/Verbal Responses: Return demonstration correctly Electronic Signature(s) Signed: 09/16/2021 11:10:10 AM By: Dellie Catholic RN Entered By: Dellie Catholic on 09/16/2021 10:57:34 -------------------------------------------------------------------------------- Wound Assessment Details Patient Name: Date of Service[name] 09/16/2021 10:15 A M Medical Record Number: [id number] Patient Account Number: [id number] Date of Birth/Sex: Treating RN: [birth year] [name] Primary Care Zailyn Thoennes: [name] Other Clinician: Referring Enyla Lisbon: Treating Osias Resnick/Extender: [name] in Treatment: 10 Wound Status Wound Number: 2 Primary Etiology: Diabetic Wound/Ulcer of the Lower Extremity Wound Location: Right, Lateral, Plantar Foot Wound Status: Open Wounding Event: Gradually Appeared Comorbid History: Type II Diabetes Date Acquired: 12/19/2016 Weeks Of Treatment: 10 Clustered Wound: No Photos Wound Measurements Length: (cm) 0.3 Width: (cm) 1.5 Depth: (cm) 0.5 Area: (cm) 0.353 Volume: (cm) 0.177 % Reduction in Area: 90.2% % Reduction in Volume: 99% Epithelialization: Small (1-33%) Undermining: Yes Starting Position (o'clock): 3 Ending Position (o'clock): 5 Maximum Distance: (cm) 0.2 Wound Description Classification: Grade 3 Wound Margin: Thickened Exudate Amount: Medium Exudate Type: Serosanguineous Exudate Color: red, brown Foul Odor After Cleansing: No Slough/Fibrino No Wound Bed Granulation Amount: Small (1-33%) Exposed Structure Granulation Quality: Pink Fascia Exposed: No Necrotic Amount: Large (67-100%) Fat Layer (Subcutaneous Tissue) Exposed: Yes Necrotic Quality: Eschar, Adherent Slough Tendon Exposed: No Muscle Exposed: No Joint Exposed: No Bone Exposed: No Treatment Notes Wound #2 (Foot) Wound Laterality: Plantar, Right, Lateral Cleanser Soap and Water Discharge Instruction: May shower  and wash wound with dial antibacterial soap and water prior to dressing change. Wound Cleanser Discharge Instruction: Cleanse the wound with wound cleanser prior to applying a clean dressing using gauze sponges, not tissue or cotton balls. Peri-Wound Care cotton tipped applicators Topical Gentamicin Discharge Instruction: As directed by physician Primary Dressing KerraCel Ag Gelling Fiber Dressing, 4x5 in (silver alginate) Discharge Instruction: Apply silver alginate to wound bed as instructed Secondary Dressing Secured With Paper Tape, 2x10 (in/yd) Discharge Instruction: Secure dressing with tape as directed. Compression Wrap Kerlix Roll 4.5x3.1 (in/yd) Discharge Instruction: Apply Kerlix and Coban compression as directed. Compression Stockings Add-Ons Electronic Signature(s) Signed: 09/16/2021 11:10:10 AM By: Dellie Catholic RN Entered By: Dellie Catholic on 09/16/2021 10:33:05 -------------------------------------------------------------------------------- Vitals Details Patient Name: Date of [place]: [name] 09/16/2021 10:15 A M Medical Record Number: [id number] Patient Account Number: [id number] Date of Birth/Sex: Treating RN: [birth year] [name] Primary Care Maurilio Puryear: [name] Other Clinician: Referring Merle Cirelli: Treating Gweneth Fredlund/Extender: [name] in Treatment: 10 Vital Signs Time Taken: 10:17 Temperature (F): 97.3 Height (in): 72 Pulse (bpm): 74 Weight (lbs): 312 Respiratory Rate (breaths/min): 12 Body Mass Index (BMI): 42.3 Blood Pressure (mmHg): 169/99 Capillary Blood Glucose (mg/dl): 201 Reference Range: 80 - 120 mg / dl Electronic Signature(s) Signed: 09/16/2021 11:10:10 AM By: Dellie Catholic RN Entered By: Dellie Catholic on 09/16/2021 10:23:26

## 2021-09-16 NOTE — Progress Notes (Signed)
[name], [name] ([id number]) Visit Report for 09/13/2021 Problem List Details Patient Name: Date of [place]: [name] 09/13/2021 8:00 A M Medical Record Number: [id number] Patient Account Number: [id number] Date of Birth/Sex: Treating RN: 1970/02/17 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 09/13/2021 Medical Record Number: [id number] Patient Account Number: [id number] Date of Birth/Sex: Treating RN: April 12, 1970 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 LN05.397Non-pressure chronic ulcer of other part of right foot with necrosis  of bone M86.671 Other chronic osteomyelitis, right ankle and foot Modifier: Quantity: 4 Physician Procedures : CPT4 Code Description Modifier [id number] 79024- WC PHYS HYPERBARIC OXYGEN THERAPY ICD-10 Diagnosis Description 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

## 2021-09-16 NOTE — Progress Notes (Signed)
[name], [name] ([id number]) Visit Report for 09/16/2021 Problem List Details Patient Name: Date of [name], [place] [name] 09/16/2021 8:00 A M Medical Record Number: [id number] Patient Account Number: [id number] Date of Birth/Sex: Treating RN: 1971/01/07 (51 y.o. 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 09/16/2021 Medical Record Number: [id number] Patient Account Number: [id number] Date of Birth/Sex: Treating RN: [birth year] (51 y.o. 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 LM22.633Non-pressure chronic ulcer of other part of right foot with necrosis  of bone M86.671 Other chronic osteomyelitis, right ankle and foot Modifier: Quantity: 4 Physician Procedures : CPT4 Code Description Modifier [id number] 63893- WC PHYS HYPERBARIC OXYGEN THERAPY ICD-10 Diagnosis Description 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

## 2021-09-16 NOTE — Progress Notes (Addendum)
[name], Fumio M. ([id number]) Visit Report for 09/16/2021 HBO Details Patient Name: Date of Service: [name], [place] [name] 09/16/2021 8:00 A M Medical Record Number: [id number] Patient Account Number: [id number] Date of Birth/Sex: Treating RN: 01/09/1971 (51 y.o. [name] Primary Care Barry Faircloth: [name] Other Clinician: [name] Referring Almalik Weissberg: Treating Nakyah Erdmann/Extender: [name] in Treatment: 10 HBO Treatment Course Details Treatment Course Number: 1 Ordering Rudolph Daoust: [name] T Treatments Ordered: otal 40 HBO Treatment Start Date: 08/14/2021 HBO Indication: Diabetic Ulcer(s) of the Lower Extremity Standard/Conservative Wound Care tried and failed greater than or equal to 30 days Wound #2 Right, Lateral, Plantar Foot , Wound #3R Right, Lateral Foot HBO Treatment Details Treatment Number: 24 Patient Type: Outpatient Chamber Type: Monoplace Chamber Serial #: [id number] Treatment Protocol: 2.0 ATA with 90 minutes oxygen, and no air breaks Treatment Details Compression Rate Down: 2.0 psi / minute De-Compression Rate Up: 2.0 psi / minute Air breaks and breathing Decompress Decompress Compress Tx Pressure Begins Reached periods Begins Ends (leave unused spaces blank) Chamber Pressure (ATA 1 2 ------2 1 ) Clock Time (24 hr) 08:16 08:24 - - - - - - 09:54 10:03 Treatment Length: 107 (minutes) Treatment Segments: 4 Vital Signs Capillary Blood Glucose Reference Range: 80 - 120 mg / dl HBO Diabetic Blood Glucose Intervention Range: <131 mg/dl or >249 mg/dl Time Vitals Blood Respiratory Capillary Blood Glucose Pulse Action Type: Pulse: Temperature: Taken: Pressure: Rate: Glucose (mg/dl): Meter #: Oximetry (%) Taken: Pre 08:08 147/81 93 18 97.3 212 Post 10:05 169/99 74 12 97.3 201 Treatment Response Treatment Toleration: Well Treatment Completion Status: Treatment Completed without Adverse Event Additional  Procedure Documentation Tissue Sevierity: Necrosis of bone Physician HBO Attestation: I certify that I supervised this HBO treatment in accordance with Medicare guidelines. A trained emergency response team is readily available per Yes hospital policies and procedures. Continue HBOT as ordered. Yes Electronic Signature(s) Signed: 09/16/2021 4:36:36 PM By: [name] DO Previous Signature: 09/16/2021 12:14:06 PM Version By: [name] EMT Entered By: [name] on 09/16/2021 13:20:11 -------------------------------------------------------------------------------- HBO Safety Checklist Details Patient Name: Date of Service: [name], [name] 09/16/2021 8:00 A M Medical Record Number: [id number] Patient Account Number: [id number] Date of Birth/Sex: Treating RN: 09/17/1970 (51 y.o. [name] Primary Care Mirel Hundal: [name] Other Clinician: [name] Referring Forever Arechiga: Treating Yamin Swingler/Extender: [name] in Treatment: 10 HBO Safety Checklist Items Safety Checklist Consent Form Signed Patient voided / foley secured and emptied When did you last eato 0650 Last dose of injectable or oral agent 0630 Ostomy pouch emptied and vented if applicable NA All implantable devices assessed, documented and approved NA PICC line Intravenous access site secured and place PICC Line Valuables secured Linens and cotton and cotton/polyester blend (less than 51% polyester) Personal oil-based products / skin lotions / body lotions removed Wigs or hairpieces removed NA Smoking or tobacco materials removed Books / newspapers / magazines / loose paper removed Cologne, aftershave, perfume and deodorant removed Jewelry removed (may wrap wedding band) NA Make-up removed NA Hair care products removed Battery operated devices (external) removed Heating patches and chemical warmers removed Titanium eyewear removed NA Nail polish  cured greater than 10 hours NA Casting material cured greater than 10 hours NA Hearing aids removed NA Loose dentures or partials removed NA Prosthetics have been removed NA Patient demonstrates correct use of air break device (if applicable) Patient concerns have been addressed Patient grounding bracelet on and cord attached to  chamber Specifics for Inpatients (complete in addition to above) Medication sheet sent with patient NA Intravenous medications needed or due during therapy sent with patient NA Drainage tubes (e.g. nasogastric tube or chest tube secured and vented) NA Endotracheal or Tracheotomy tube secured NA Cuff deflated of air and inflated with saline NA Airway suctioned NA Notes The safety checklist was done before the treatment was started Electronic Signature(s) Signed: 09/16/2021 12:12:21 PM By: [name] EMT Entered By: [name] on 09/16/2021 12:12:21

## 2021-09-16 NOTE — Progress Notes (Addendum)
[name], Anthone M. ([id number]) Visit Report for 09/16/2021 Arrival Information Details Patient Name: Date of Service: [place], [place] [name] 09/16/2021 8:00 A M Medical Record Number: [id number] Patient Account Number: [id number] Date of Birth/Sex: Treating RN: 01-24-1970 (51 y.o. [name] Primary Care Meera Vasco: [name] Other Clinician: [name] Referring Mariellen Blaney: Treating Bristal Steffy/Extender: [name] in Treatment: 10 Visit Information History Since Last Visit All ordered tests and consults were completed: Yes Patient Arrived: Ambulatory Added or deleted any medications: No Arrival Time: 07:36 Any new allergies or adverse reactions: No Accompanied By: None Had a fall or experienced change in No Transfer Assistance: None activities of daily living that may affect Patient Identification Verified: Yes risk of falls: Secondary Verification Process Completed: Yes Signs or symptoms of abuse/neglect since last visito No Patient Requires Transmission-Based Precautions: No Hospitalized since last visit: No Patient Has Alerts: Yes Implantable device outside of the clinic excluding No cellular tissue based products placed in the center since last visit: Pain Present Now: No Electronic Signature(s) Signed: 09/16/2021 11:58:12 AM By: [name] EMT Entered By: [name] on 09/16/2021 11:58:12 -------------------------------------------------------------------------------- Encounter Discharge Information Details Patient Name: Date of Service: [place], [name] 09/16/2021 8:00 A M Medical Record Number: [id number] Patient Account Number: [id number] Date of Birth/Sex: Treating RN: 03/23/1970 (51 y.o. [name] Primary Care Patricio Popwell: [name] Other Clinician: [name] Referring Abbas Beyene: Treating Jovian Lembcke/Extender: [name] in Treatment: 10 Encounter Discharge Information  Items Discharge Condition: Stable Ambulatory Status: Ambulatory Discharge Destination: Home Transportation: Private Auto Accompanied By: None Schedule Follow-up Appointment: Yes Clinical Summary of Care: Electronic Signature(s) Signed: 09/16/2021 12:15:58 PM By: [name] EMT Entered By: [name] on 09/16/2021 12:15:58 -------------------------------------------------------------------------------- Vitals Details Patient Name: Date of Service: [name], [name] 09/16/2021 8:00 A M Medical Record Number: [id number] Patient Account Number: [id number] Date of Birth/Sex: Treating RN: [birth year] (51 y.o. [name] Primary Care Akua Blethen: [name] Other Clinician: [name] Referring Marilynne Dupuis: Treating Early Steel/Extender: [name] in Treatment: 10 Vital Signs Time Taken: 08:08 Temperature (F): 97.3 Height (in): 72 Pulse (bpm): 93 Weight (lbs): 312 Respiratory Rate (breaths/min): 18 Body Mass Index (BMI): 42.3 Blood Pressure (mmHg): 147/81 Capillary Blood Glucose (mg/dl): 212 Reference Range: 80 - 120 mg / dl Electronic Signature(s) Signed: 09/16/2021 12:10:30 PM By: [name] EMT Entered By: [name] on 09/16/2021 12:10:30

## 2021-09-16 NOTE — Progress Notes (Signed)
[name], [name] ([id number]) Visit Report for 09/16/2021 Chief Complaint Document Details Patient Name: Date of Service: [place], [place] [name] 09/16/2021 10:15 A M Medical Record Number: [id number] Patient Account Number: [id number] Date of Birth/Sex: Treating RN: 11/13/1970 (51 y.o. [name] Primary Care Provider: [name] Other Clinician: Referring Provider: Treating Provider/Extender: [name] in Treatment: 10 Information Obtained from: Patient Chief Complaint Patients presents for treatment of an open diabetic ulcer Electronic Signature(s) Signed: 09/16/2021 10:46:03 AM By: [name] MD FACS Entered By: [name] on 09/16/2021 10:46:03 -------------------------------------------------------------------------------- Debridement Details Patient Name: Date of Service: [name], [name] 09/16/2021 10:15 A M Medical Record Number: [id number] Patient Account Number: [id number] Date of Birth/Sex: Treating RN: Jul 16, 1970 (51 y.o. [name] Primary Care Provider: [name] Other Clinician: Referring Provider: Treating Provider/Extender: [name] in Treatment: 10 Debridement Performed for Assessment: Wound #2 Right,Lateral,Plantar Foot Performed By: Physician [name], MD Debridement Type: Debridement Severity of Tissue Pre Debridement: Fat layer exposed Level of Consciousness (Pre-procedure): Awake and Alert Pre-procedure Verification/Time Out Yes - 10:42 Taken: Start Time: 10:42 Pain Control: Lidocaine 5% topical ointment T Area Debrided (L x W): otal 0.3 (cm) x 1.5 (cm) = 0.45 (cm) Tissue and other material debrided: Non-Viable, Callus, Slough, Subcutaneous, Slough Level: Skin/Subcutaneous Tissue Debridement Description: Excisional Instrument: Curette Bleeding: Minimum Hemostasis Achieved: Pressure End Time: 10:43 Procedural Pain: 0 Post Procedural Pain: 0 Response  to Treatment: Procedure was tolerated well Level of Consciousness (Post- Awake and Alert procedure): Post Debridement Measurements of Total Wound Length: (cm) 0.3 Width: (cm) 1.5 Depth: (cm) 0.5 Volume: (cm) 0.177 Character of Wound/Ulcer Post Debridement: Improved Severity of Tissue Post Debridement: Fat layer exposed Post Procedure Diagnosis Same as Pre-procedure Electronic Signature(s) Signed: 09/16/2021 11:10:10 AM By: Dellie Catholic RN Signed: 09/16/2021 11:11:40 AM By: [name] MD FACS Entered By: Dellie Catholic on 09/16/2021 10:45:09 -------------------------------------------------------------------------------- HPI Details Patient Name: Date of Service: [name], [name] 09/16/2021 10:15 A M Medical Record Number: [id number] Patient Account Number: [id number] Date of Birth/Sex: Treating RN: December 26, 1970 (51 y.o. 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 The most recent amputation was in August 2022. At that operation, antibiotic beads were placed in the wound. He has been managed by podiatry for his procedures and management of his wounds. He has been in a Water engineer. He is on oral doxycycline. They have been using Betadine wet to dry dressings along with Iodosorb. The patient states that when he thinks the wound is getting too dry, he applies topical Neosporin. At his last visit, on June 7 of this year, the podiatrist determined that he felt the wound was stalled and referred him to wound care for additional evaluation and management. An MRI has been ordered, but not yet scheduled or  performed. Pathology from his operation in August 2022 demonstrated findings consistent with chronic osteomyelitis. Today, there is a large irregular wound on the plantar surface of his right foot, at about the level of the fifth metatarsal head. This tracks through to a pinpoint opening on the dorsal lateral portion of his foot. The intake nurse reported purulent drainage. There is some  malodor from the wound. No frank necrosis identified. 07/11/2021: Today, the wounds do not connect. I attempted multiple times from various directions and the shared tunnel is no longer open. He has some slough accumulation on the dorsal part of his foot as well as slough and callus buildup on the plantar surface. His MRI is scheduled for June 29. No purulent drainage or malodor appreciated today. 07/19/2021: The lateral foot wound has closed and there is no tunnel connecting the plantar foot wound to that site. The plantar foot wound still probes quite deeply, however. There is some slough, eschar, and nonviable tissue accumulated in the wound bed. No malodorous drainage present. His MRI was performed yesterday and is consistent with fairly extensive osteomyelitis. 07/29/2021: The patient has an appointment with infectious disease on July 18 to treat his osteomyelitis. The plantar wound still probes quite deeply, approaching bone. The orifice has narrowed quite substantially, however, making it more difficult for him to pack. 08/05/2021: The tunnel connecting the lateral foot wound to the plantar foot wound has reopened. He sees infectious disease tomorrow to discuss long-term antibiotic treatment for osteomyelitis. There was a bit of murky drainage in the wound, but this was noted after he had had topical lidocaine applied so may have just been a blob of the anesthetic. No odor or frank purulent drainage. The wound probes deeply at the midfoot approaching bone. He does have MRI results confirming his diagnosis of  osteomyelitis. He has failed to progress with conventional treatment and I think his best chance for preservation of the foot is to initiate hyperbaric oxygen therapy. 08/13/2021: The tunnel connecting the 2 wounds has closed again. He has a PICC line and is getting IV daptomycin and cefepime. EKG and chest x-ray are within normal limits. He is scheduled to start hyperbaric oxygen therapy tomorrow. The wound in his midfoot probes deeply, approaching bone. The skin at the orifice continues to heaped up and threatens to close over despite the large cavity within. No erythema, induration, or purulent drainage. The wound on his lateral foot is fairly small and quite clean. 08/19/2021: The lateral foot wound has nearly closed. The wound in his midfoot does not probe quite as deeply today. The skin at the orifice continues to try to roll in and obscure the opening. No frankly necrotic tissue appreciated. He is tolerating hyperbaric oxygen therapy. 08/26/2021: The lateral foot wound has closed completely. The wound in his midfoot is shallower again today. The wound orifice is contracting. We are using gentamicin and silver alginate. He continues on IV daptomycin and cefepime and is tolerating hyperbaric oxygen therapy without difficulty. 09/02/2021: His foot is a little bit sore today but he was up walking on it all weekend doing back to school shopping with his daughter. The tunneling is down to about 3 cm and I do not appreciate bone at the tip of the probe. The wound is clean but has some callus creating an overhanging lip at the distal aspect. He continues to receive hyperbaric oxygen therapy as well as IV daptomycin and cefepime. 09/09/2021: His wound continues to improve. The tunnel has come in by 0.9 cm. He continues to form callus around the orifice of the wound. He is tolerating hyperbaric oxygen therapy along with his IV antibiotics. 09/16/2021: The tunneling is down to just half a centimeter. He  continues to build up callus around the orifice of the wound. He completed his course of IV antibiotics and his PICC line is scheduled to be removed  this Friday. He is tolerating hyperbaric oxygen without difficulty. We have been applying topical gentamicin and silver alginate to his wound. Electronic Signature(s) Signed: 09/16/2021 10:46:46 AM By: [name] MD FACS Entered By: [name] on 09/16/2021 10:46:46 -------------------------------------------------------------------------------- Physical Exam Details Patient Name: Date of Service: [name], [name] 09/16/2021 10:15 A M Medical Record Number: [id number] Patient Account Number: [id number] Date of Birth/Sex: Treating RN: [birth year] (51 y.o. [name] Primary Care Provider: [name] Other Clinician: Referring Provider: Treating Provider/Extender: [name] in Treatment: 10 Constitutional Hypertensive, asymptomatic. . . . No acute distress.Marland Kitchen Respiratory Normal work of breathing on room air.. Notes 09/16/2021: The tunneling is down to just half a centimeter. He continues to build up callus around the orifice of the wound. Electronic Signature(s) Signed: 09/16/2021 10:47:30 AM By: [name] MD FACS Entered By: [name] on 09/16/2021 10:47:30 -------------------------------------------------------------------------------- Physician Orders Details Patient Name: Date of Service: [name], [name] 09/16/2021 10:15 A M Medical Record Number: [id number] Patient Account Number: [id number] Date of Birth/Sex: Treating RN: 1970-11-04 (51 y.o. 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 - Dr Celine Ahr Room 2 Tuesday September 5th at 10:45am (after HBO) Return A Anesthetic (In clinic) Topical Lidocaine 5% applied to wound bed - In Clinic Bathing/ Shower/ Hygiene Other Bathing/Shower/Hygiene Orders/Instructions: - Change dressing after bathing Off-Loading Other: - Try to not stand on your feet too much Hyperbaric Oxygen Therapy Evaluate for HBO Therapy Indication: - wagner grade 3 diabetic foot ulcer right foot 2.0 ATA for 90 Minutes without A Breaks ir Total Number of Treatments: - 40 One treatments per day (delivered Monday through Friday unless otherwise specified in Special Instructions below): Finger stick Blood Glucose Pre- and Post- HBOT Treatment. Follow Hyperbaric Oxygen Glycemia Protocol Afrin (Oxymetazoline HCL) 0.05% nasal spray - 1 spray in both nostrils daily as needed prior to HBO treatment for difficulty clearing ears Wound Treatment Wound #2 - Foot Wound Laterality: Plantar, Right, Lateral Cleanser: Soap and Water Every Other Day/15 Days Discharge Instructions: May shower and wash wound with dial antibacterial soap and water prior to dressing change. Cleanser: Wound Cleanser (Generic) Every Other Day/15 Days Discharge Instructions: Cleanse the wound with wound cleanser prior to applying a clean dressing using gauze sponges, not tissue or cotton balls. Peri-Wound Care: cotton tipped applicators (Generic) Every Other Day/15 Days Topical: Gentamicin Every Other Day/15 Days Discharge Instructions: As directed by physician Prim Dressing: KerraCel Ag Gelling Fiber Dressing, 4x5 in (silver alginate) (Generic) Every Other Day/15 Days ary Discharge Instructions: Apply silver alginate to wound bed as instructed Secured With: Paper Tape, 2x10 (in/yd) (Generic) Every  Other Day/15 Days Discharge Instructions: Secure dressing with tape as directed. Compression Wrap: Kerlix Roll 4.5x3.1 (in/yd) (Generic) Every Other Day/15 Days Discharge Instructions: Apply Kerlix and Coban compression as directed. GLYCEMIA INTERVENTIONS PROTOCOL PRE-HBO GLYCEMIA INTERVENTIONS ACTION INTERVENTION Obtain pre-HBO capillary blood glucose (ensure 1 physician order is in chart). A. Notify HBO physician and await physician orders. 2 If result is 70 mg/dl or below: B. If the result meets the hospital definition of a critical result, follow hospital policy. A.  Give patient an 8 ounce Glucerna Shake, an 8 ounce Ensure, or 8 ounces of a Glucerna/Ensure equivalent dietary supplement*. B. Wait 30 minutes. If result is 71 mg/dl to 130 mg/dl: C. Retest patients capillary blood glucose (CBG). D. If result greater than or equal to 110 mg/dl, proceed with HBO. If result less than 110 mg/dl, notify HBO physician and consider holding HBO. If result is 131 mg/dl to 249 mg/dl: A. Proceed with HBO. A. Notify HBO physician and await physician orders. B. It is recommended to hold HBO and do If result is 250 mg/dl or greater: blood/urine ketone testing. C. If the result meets the hospital definition of a critical result, follow hospital policy. POST-HBO GLYCEMIA INTERVENTIONS ACTION INTERVENTION Obtain post HBO capillary blood glucose (ensure 1 physician order is in chart). A. Notify HBO physician and await physician orders. 2 If result is 70 mg/dl or below: B. If the result meets the hospital definition of a critical result, follow hospital policy. A. Give patient an 8 ounce Glucerna Shake, an 8 ounce Ensure, or 8 ounces of a Glucerna/Ensure equivalent dietary supplement*. B. Wait 15 minutes for symptoms of If result is 71 mg/dl to 100 mg/dl: hypoglycemia (i.e. nervousness, anxiety, sweating, chills, clamminess, irritability, confusion, tachycardia or  dizziness). C. If patient asymptomatic, discharge patient. If patient symptomatic, repeat capillary blood glucose (CBG) and notify HBO physician. If result is 101 mg/dl to 249 mg/dl: A. Discharge patient. A. Notify HBO physician and await physician orders. B. It is recommended to do blood/urine ketone If result is 250 mg/dl or greater: testing. C. If the result meets the hospital definition of a critical result, follow hospital policy. *Juice or candies are NOT equivalent products. If patient refuses the Glucerna or Ensure, please consult the hospital dietitian for an appropriate substitute. Electronic Signature(s) Signed: 09/16/2021 11:11:40 AM By: [name] MD FACS Entered By: [name] on 09/16/2021 10:50:27 -------------------------------------------------------------------------------- Problem List Details Patient Name: Date of Service: [name], [name] 09/16/2021 10:15 A M Medical Record Number: [id number] Patient Account Number: [id number] Date of Birth/Sex: Treating RN: [birth year] (51 y.o. 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 09/16/2021 10:15 A  M Medical Record Number: [id number] Patient Account Number: [id number] Date of Birth/Sex: Treating RN: 08/20/1970 (51 y.o. 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 The most recent amputation was in August 2022. At that operation, antibiotic beads were placed in the wound. He has been managed by podiatry for his procedures and management of his wounds. He has been  in a Water engineer. He is on oral doxycycline. They have been using Betadine wet to dry dressings along with Iodosorb. The patient states that when he thinks the wound is getting too dry, he applies topical Neosporin. At his last visit, on June 7 of this year, the podiatrist determined that he felt the wound was stalled and referred him to wound care for additional evaluation and management. An MRI has been ordered, but not yet scheduled or performed. Pathology from his operation in August 2022 demonstrated findings consistent with chronic osteomyelitis. Today, there is a large irregular wound on the plantar surface of his right foot, at about the level of the fifth metatarsal head. This tracks through to a pinpoint opening on the dorsal lateral portion of his foot. The intake nurse reported purulent drainage. There is some malodor from the wound. No frank necrosis identified. 07/11/2021: Today, the wounds do not connect. I attempted multiple times from various directions and the shared tunnel is no longer open. He has some slough accumulation  on the dorsal part of his foot as well as slough and callus buildup on the plantar surface. His MRI is scheduled for June 29. No purulent drainage or malodor appreciated today. 07/19/2021: The lateral foot wound has closed and there is no tunnel connecting the plantar foot wound to that site. The plantar foot wound still probes quite deeply, however. There is some slough, eschar, and nonviable tissue accumulated in the wound bed. No malodorous drainage present. His MRI was performed yesterday and is consistent with fairly extensive osteomyelitis. 07/29/2021: The patient has an appointment with infectious disease on July 18 to treat his osteomyelitis. The plantar wound still probes quite deeply, approaching bone. The orifice has narrowed quite substantially, however, making it more difficult for him to pack. 08/05/2021: The tunnel connecting the lateral foot wound to the plantar foot wound has reopened. He sees infectious disease tomorrow to discuss long-term antibiotic treatment for osteomyelitis. There was a bit of murky drainage in the wound, but this was noted after he had had topical lidocaine applied so may have just been a blob of the anesthetic. No odor or frank purulent drainage. The wound probes deeply at the midfoot approaching bone. He does have MRI results confirming his diagnosis of osteomyelitis. He has failed to progress with conventional treatment and I think his best chance for preservation of the foot is to initiate hyperbaric oxygen therapy. 08/13/2021: The tunnel connecting the 2 wounds has closed again. He has a PICC line and is getting IV daptomycin and cefepime. EKG and chest x-ray are within normal limits. He is scheduled to start hyperbaric oxygen therapy tomorrow. The wound in his midfoot probes deeply, approaching bone. The skin at the orifice continues to heaped up and threatens to close over despite the large cavity within. No erythema, induration, or purulent drainage. The  wound on his lateral foot is fairly small and quite clean. 08/19/2021: The lateral foot wound has nearly closed. The wound in his midfoot does not probe quite as deeply today. The skin at the orifice continues to try to roll in and obscure the opening. No frankly necrotic tissue appreciated. He is tolerating hyperbaric oxygen therapy. 08/26/2021: The lateral foot wound has closed completely. The wound in his midfoot is shallower again today. The wound orifice is contracting. We are using gentamicin and silver alginate. He continues on IV daptomycin and cefepime and is tolerating hyperbaric oxygen therapy without difficulty. 09/02/2021: His foot is a little bit  sore today but he was up walking on it all weekend doing back to school shopping with his daughter. The tunneling is down to about 3 cm and I do not appreciate bone at the tip of the probe. The wound is clean but has some callus creating an overhanging lip at the distal aspect. He continues to receive hyperbaric oxygen therapy as well as IV daptomycin and cefepime. 09/09/2021: His wound continues to improve. The tunnel has come in by 0.9 cm. He continues to form callus around the orifice of the wound. He is tolerating hyperbaric oxygen therapy along with his IV antibiotics. 09/16/2021: The tunneling is down to just half a centimeter. He continues to build up callus around the orifice of the wound. He completed his course of IV antibiotics and his PICC line is scheduled to be removed this Friday. He is tolerating hyperbaric oxygen without difficulty. We have been applying topical gentamicin and silver alginate to his wound. Patient History Information obtained from Patient. Social History Never smoker, Marital Status - Married, Alcohol Use - Never, Drug Use - No History, Caffeine Use - Daily - T coffee. ea; Medical History Endocrine Patient has history of Type II Diabetes Hospitalization/Surgery History - Amuptation of 3rd,4th and 5th toes of  Right foot;Oral Surgery;Anal Fissure surgery; Cholecystectomy. Objective Constitutional Hypertensive, asymptomatic. No acute distress.. Vitals Time Taken: 10:17 AM, Height: 72 in, Weight: 312 lbs, BMI: 42.3, Temperature: 97.3 F, Pulse: 74 bpm, Respiratory Rate: 12 breaths/min, Blood Pressure: 169/99 mmHg, Capillary Blood Glucose: 201 mg/dl. Respiratory Normal work of breathing on room air.. General Notes: 09/16/2021: The tunneling is down to just half a centimeter. He continues to build up callus around the orifice of the wound. Integumentary (Hair, Skin) Wound #2 status is Open. Original cause of wound was Gradually Appeared. The date acquired was: 12/19/2016. The wound has been in treatment 10 weeks. The wound is located on the [place]. The wound measures 0.3cm length x 1.5cm width x 0.5cm depth; 0.353cm^2 area and 0.177cm^3 volume. There is Fat Layer (Subcutaneous Tissue) exposed. There is undermining starting at 3:00 and ending at 5:00 with a maximum distance of 0.2cm. There is a medium amount of serosanguineous drainage noted. The wound margin is thickened. There is small (1-33%) pink granulation within the wound bed. There is a large (67-100%) amount of necrotic tissue within the wound bed including Eschar and Adherent Slough. 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 There was a Excisional Skin/Subcutaneous Tissue Debridement with a total area of 0.45 sq cm performed by [name], MD. With the following instrument(s): Curette to remove Non-Viable tissue/material.  Material removed includes Callus, Subcutaneous Tissue, and Slough after achieving pain control using Lidocaine 5% topical ointment. No specimens were taken. A time out was conducted at 10:42, prior to the start of the procedure. A Minimum amount of bleeding was controlled with Pressure. The procedure was tolerated well with a pain level of 0 throughout and a pain level of 0 following the procedure. Post Debridement Measurements: 0.3cm length x 1.5cm width x 0.5cm depth; 0.177cm^3 volume. Character of Wound/Ulcer Post Debridement is improved. Severity of Tissue Post Debridement is: Fat layer exposed. Post procedure Diagnosis  Wound #2: Same as Pre-Procedure Plan Follow-up Appointments: Return Appointment in 1 week. - Dr Celine Ahr Room 2 Tuesday September 5th at 10:45am (after HBO) Anesthetic: (In clinic) Topical Lidocaine 5% applied to wound bed - In Clinic Bathing/ Shower/ Hygiene: Other Bathing/Shower/Hygiene Orders/Instructions: - Change dressing after bathing Off-Loading: Other: - Try to not stand on your feet too much Hyperbaric Oxygen Therapy: Evaluate for HBO Therapy Indication: - wagner grade 3 diabetic foot ulcer right foot 2.0 ATA for 90 Minutes without Air Breaks T Number of Treatments: - 40 otal One treatments per day (delivered Monday through Friday unless otherwise specified in Special Instructions below): Finger stick Blood Glucose Pre- and Post- HBOT Treatment. Follow Hyperbaric Oxygen Glycemia Protocol Afrin (Oxymetazoline HCL) 0.05% nasal spray - 1 spray in both nostrils daily as needed prior to HBO treatment for difficulty clearing ears WOUND #2: - Foot Wound Laterality: Plantar, Right, Lateral Cleanser: Soap and Water Every Other Day/15 Days Discharge Instructions: May shower and wash wound with dial antibacterial soap and water prior to dressing change. Cleanser: Wound Cleanser (Generic) Every Other Day/15 Days Discharge Instructions: Cleanse the wound with wound cleanser  prior to applying a clean dressing using gauze sponges, not tissue or cotton balls. Peri-Wound Care: cotton tipped applicators (Generic) Every Other Day/15 Days Topical: Gentamicin Every Other Day/15 Days Discharge Instructions: As directed by physician Prim Dressing: KerraCel Ag Gelling Fiber Dressing, 4x5 in (silver alginate) (Generic) Every Other Day/15 Days ary Discharge Instructions: Apply silver alginate to wound bed as instructed Secured With: Paper T ape, 2x10 (in/yd) (Generic) Every Other Day/15 Days Discharge Instructions: Secure dressing with tape as directed. Com pression Wrap: Kerlix Roll 4.5x3.1 (in/yd) (Generic) Every Other Day/15 Days Discharge Instructions: Apply Kerlix and Coban compression as directed. 09/16/2021: The tunneling is down to just half a centimeter. He continues to build up callus around the orifice of the wound. I used a curette to debride callus, slough, and nonviable subcutaneous tissue from the wound. We will continue to use gentamicin and silver alginate packing for the wound. He will continue his hyperbaric oxygen therapy. Follow-up in 1 week. Electronic Signature(s) Signed: 09/16/2021 10:50:56 AM By: [name] MD FACS Entered By: [name] on 09/16/2021 10:50:56 -------------------------------------------------------------------------------- HxROS Details Patient Name: Date of Service: [name], [name] 09/16/2021 10:15 A M Medical Record Number: [id number] Patient Account Number: [id number] Date of Birth/Sex: Treating RN: 08/20/1970 (51 y.o. [name] Primary Care Provider: [name] Other Clinician: Referring Provider: Treating Provider/Extender: [name] in Treatment: 10 Information Obtained From Patient Endocrine Medical History: Positive for: Type II Diabetes Immunizations Pneumococcal Vaccine: Received Pneumococcal Vaccination: Yes Received Pneumococcal Vaccination On or After  60th Birthday: No Implantable Devices None Hospitalization / Surgery History Type of Hospitalization/Surgery Amuptation of 3rd,4th and 5th toes of Right foot;Oral Surgery;Anal Fissure surgery; Cholecystectomy Family and Social History Never smoker; Marital Status - Married; Alcohol Use: Never; Drug Use: No History; Caffeine Use: Daily - T coffee; Financial Concerns: No; Food, ea; Clothing or Shelter Needs: No; Support System Lacking: No; Transportation Concerns: No Electronic Signature(s) Signed: 09/16/2021 11:10:10 AM By: Dellie Catholic RN Signed: 09/16/2021 11:11:40 AM By: [name] MD FACS Entered By: [name] on 09/16/2021 10:47:07 -------------------------------------------------------------------------------- SuperBill Details Patient Name: Date of Service: [name], [name] 09/16/2021 Medical Record Number: [id number] Patient Account Number: [id number] Date of Birth/Sex: Treating RN: 1970-08-01 (51 y.o. 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 S82.707 Other chronic osteomyelitis, right ankle and foot L97.514 Non-pressure chronic ulcer of other part of right foot with necrosis of bone Facility Procedures CPT4 Code: 86754492 Description: 01007 - DEB SUBQ TISSUE 20 SQ CM/< ICD-10 Diagnosis Description L97.514 Non-pressure chronic ulcer of other part of right foot with necrosis of bone Modifier: Quantity: 1 Physician Procedures : CPT4 Code Description Modifier [id number] 83254 - WC PHYS LEVEL 4 - EST PT 25 ICD-10 Diagnosis Description L97.514 Non-pressure chronic ulcer of other part of right foot with necrosis of bone E11.49 Type 2 diabetes mellitus with other diabetic  neurological complication D82.641  Other chronic osteomyelitis, right ankle and foot Quantity: 1 : [id number] 76808 - WC PHYS SUBQ TISS 20 SQ CM ICD-10 Diagnosis Description L97.514 Non-pressure chronic ulcer of other part of right foot with necrosis of bone Quantity: 1 Electronic Signature(s) Signed: 09/16/2021 10:51:13 AM By: [name] MD FACS Entered By: [name] on 09/16/2021 10:51:12

## 2021-09-17 ENCOUNTER — Encounter (HOSPITAL_BASED_OUTPATIENT_CLINIC_OR_DEPARTMENT_OTHER): Payer: BLUE CROSS/BLUE SHIELD | Admitting: Internal Medicine

## 2021-09-17 ENCOUNTER — Ambulatory Visit: Payer: BLUE CROSS/BLUE SHIELD | Admitting: Podiatry

## 2021-09-17 DIAGNOSIS — L97514 Non-pressure chronic ulcer of other part of right foot with necrosis of bone: Secondary | ICD-10-CM

## 2021-09-17 DIAGNOSIS — E11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ical complication: Secondary | ICD-10-CM | POA: Diagnosis not present

## 2021-09-17 DIAGNOSIS — S90422A Blister (nonthermal), left great toe, initial encounter: Secondary | ICD-10-CM

## 2021-09-17 DIAGNOSIS — M86671 Other chronic osteomyelitis, right ankle and foot: Secondary | ICD-10-CM

## 2021-09-17 LAB — GLUCOSE, CAPILLARY
Glucose-Capillary: 157 mg/dL — ABNORMAL HIGH (ref 70–99)
Glucose-Capillary: 131 mg/dL — ABNORMAL HIGH (ref 70–99)

## 2021-09-17 NOTE — Progress Notes (Signed)
[name], Alaster M. ([id number]) Visit Report for 09/17/2021 Problem List Details Patient Name: Date of Service: [name], [name] 09/17/2021 8:00 A M Medical Record Number: [id number] Patient Account Number: [id number] Date of Birth/Sex: Treating RN: August 24, 1970 (51 y.o. 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 09/17/2021 Medical Record Number: [id number] Patient Account Number: [id number] Date of Birth/Sex: Treating RN: Aug 05, 1970 (51 y.o. 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 LT01.779Non-pressure chronic ulcer of other part of right foot with necrosis  of bone M86.671 Other chronic osteomyelitis, right ankle and foot Modifier: Quantity: 4 Physician Procedures : CPT4 Code Description Modifier [id number] 23300- WC PHYS HYPERBARIC OXYGEN THERAPY ICD-10 Diagnosis Description 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

## 2021-09-17 NOTE — Progress Notes (Signed)
Subjective:  Patient ID: [name], male    DOB: 1970-10-09,  MRN: [id number]  Chief Complaint  Patient presents with   Callouses    51 y.o. male presents with the above complaint.  Patient presents with left submetatarsal 1 blister.  Patient states that he is worried that it could become a little bit ulceration.  He is being treated at the wound care center for the right side.  He wanted the left side to be evaluated.  He denies any other acute complaints.  He has not been doing anything for it.  He thinks it may have happened from putting too much pressure to the left side because of the wound issues on the right side.  Denies any other acute issues no pain to the wound.   Review of Systems: Negative except as noted in the HPI. Denies N/V/F/Ch.  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   Prolactinoma, 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   (06-17-2019 pt stated has been stable with no changes and followed by pcp)   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[place])    11-06-2016  lower extremity angiography (dr [name])  mil tibial disease w/ occlusion of the distal peroneal artery and dorsalis pedis artery but has widely patent posterior tibial and anterior tibial arterties   Type 2 diabetes mellitus treated with insulin ([place])    followed by pcp   (06-17-2019  pt stated checks blood sugar daily in am,  fasting sugar-- average 71)   Vitamin B 12 deficiency     Current Outpatient Medications:     ascorbic acid (VITAMIN C) 500 MG tablet, Take 500 mg by mouth daily., Disp: , Rfl:    aspirin EC 81 MG tablet, Take 81 mg by mouth daily., Disp: , Rfl:    atorvastatin (LIPITOR) 80 MG tablet, Take 1 tablet (80 mg total) by mouth daily., Disp: 90 tablet, Rfl: 3   Cyanocobalamin (VITAMIN B-12) 5000 MCG SUBL, Place 5,000 mcg under the tongue daily., Disp: , Rfl:    fenofibrate (TRICOR) 48 MG tablet, TAKE 1 TABLET BY MOUTH EVERY DAY, Disp: 90 tablet, Rfl: 4   glucose blood (ACCU-CHEK GUIDE) test strip, Dx E11.49 - Use to check blood glucose three times daily or as directed., Disp: 300 each, Rfl: 1   hydrochlorothiazide (MICROZIDE) 12.5 MG capsule, TAKE 1 CAPSULE BY MOUTH EVERY DAY IN THE MORNING, Disp: 100 capsule, Rfl: 3   insulin aspart (NOVOLOG) 100 UNIT/ML FlexPen, Inject 15-25 Units into the skin 3 (three) times daily with meals. Please provide pen needles if possible, Disp: 45 mL, Rfl: 3   insulin degludec (TRESIBA FLEXTOUCH) 200 UNIT/ML FlexTouch Pen, Inject 80-90 Units into the skin daily., Disp: 15 mL, Rfl: 3   Insulin Syringe-Needle U-100 (BD INSULIN SYRINGE U/F) 31G X 5/16" 0.5 ML MISC, Use to inject insulin 4 times daily. DX:E11.9, Disp: 100 each, Rfl: 4   Insulin Syringe-Needle U-100 (BD  INSULIN SYRINGE U/F) 31G X 5/16" 1 ML MISC, Use to inject tresiba/long acting insulin once daily, Disp: 100 each, Rfl: 3   Insulin Syringe-Needle U-100 (BD VEO INSULIN SYR U/F 1/2UNIT) 31G X 15/64" 0.3 ML MISC, USE TO INJECT INSULIN FOUR TIMES DAILY AS DIRECTED, Disp: 300 each, Rfl: 2   Lancets (ONETOUCH ULTRASOFT) lancets, Use to test blood sugars daily. Dx: E11.9, Disp: 100 each, Rfl: 12   Lancets MISC, USED TO CHECK BLOOD GLUCOSE 3-4 TIMES DAILY, Disp: 200 each, Rfl: 3   [START ON 09/21/2021] levofloxacin (LEVAQUIN) 750 MG tablet, Take 1 tablet (750 mg total) by mouth daily for 14 days., Disp: 14 tablet, Rfl: 0   [START ON 09/21/2021] linezolid (ZYVOX) 600 MG tablet, Take 1 tablet (600 mg total) by mouth 2  (two) times daily for 14 days., Disp: 28 tablet, Rfl: 0   Multiple Vitamin (MULTIVITAMIN WITH MINERALS) TABS tablet, Take 1 tablet by mouth 2 (two) times daily. (Patient not taking: Reported on 09/05/2021), Disp: , Rfl:    Omega-3 Fatty Acids (FISH OIL) 1360 MG CAPS, Take 1,360 mg by mouth daily. , Disp: , Rfl:    pyridoxine (B-6) 100 MG tablet, Take 100 mg by mouth 2 (two) times daily., Disp: , Rfl:    ramipril (ALTACE) 5 MG capsule, TAKE 1 CAPSULE BY MOUTH EVERY DAY IN THE MORNING, Disp: 90 capsule, Rfl: 4   sitaGLIPtin (JANUVIA) 50 MG tablet, Take 1 tablet (50 mg total) by mouth daily., Disp: 100 tablet, Rfl: 3  Social History   Tobacco Use  Smoking Status Never  Smokeless Tobacco Never    No Known Allergies Objective:  There were no vitals filed for this visit. There is no height or weight on file to calculate BMI. Constitutional Well developed. Well nourished.  Vascular Dorsalis pedis pulses palpable bilaterally. Posterior tibial pulses palpable bilaterally. Capillary refill normal to all digits.  No cyanosis or clubbing noted. Pedal hair growth normal.  Neurologic Normal speech. Oriented to person, place, and time. Epicritic sensation to light touch grossly present bilaterally.  Dermatologic Left submetatarsal 1 friction blister with underlying wound.  No purulent drainage noted no erythema or redness noted.  No bony abnormalities noted.  No deep ulceration noted.  Orthopedic: Normal joint ROM without pain or crepitus bilaterally. No visible deformities. No bony tenderness.   Radiographs: None Assessment:   1. Blister of left great toe, initial encounter    Plan:  Patient was evaluated and treated and all questions answered.  Left submetatarsal 1 friction blister with underlying superficial ulceration limited to the breakdown of the skin -All questions and concerns were discussed with the patient in extensive detail. -I discussed with him that apply surgical shoe to  the left side and do Betadine dressings once a day.  He states understanding will do so immediately. -He is scheduled to be seen for the right side I discussed with him regarding follow-up for the left side as well at the wound care center.  He states understanding.  He is currently undergoing hyperbaric oxygen therapy.  No follow-ups on file.

## 2021-09-17 NOTE — Progress Notes (Signed)
[name], Lamount M. ([id number]) Visit Report for 09/17/2021 Arrival Information Details Patient Name: Date of Service: [place], [place] [name] 09/17/2021 8:00 A M Medical Record Number: [id number] Patient Account Number: [id number] Date of Birth/Sex: Treating RN: [birth year] (51 y.o. [name], [name] Primary Care Elliot Simoneaux: [name] Other Clinician: [name] Referring Lateshia Schmoker: Treating Ethelbert Thain/Extender: [name] in Treatment: 10 Visit Information History Since Last Visit All ordered tests and consults were completed: Yes Patient Arrived: Ambulatory Added or deleted any medications: No Arrival Time: 07:35 Any new allergies or adverse reactions: No Accompanied By: None Had a fall or experienced change in No Transfer Assistance: None activities of daily living that may affect Patient Identification Verified: Yes risk of falls: Secondary Verification Process Completed: Yes Signs or symptoms of abuse/neglect since last visito No Patient Requires Transmission-Based Precautions: No Hospitalized since last visit: No Patient Has Alerts: Yes Implantable device outside of the clinic excluding No cellular tissue based products placed in the center since last visit: Pain Present Now: No Electronic Signature(s) Signed: 09/17/2021 3:43:54 PM By: [name] EMT Entered By: [name] on 09/17/2021 15:43:54 -------------------------------------------------------------------------------- Encounter Discharge Information Details Patient Name: Date of Service: [place], [name] 09/17/2021 8:00 A M Medical Record Number: [id number] Patient Account Number: [id number] Date of Birth/Sex: Treating RN: 27-Dec-1970 (51 y.o. [name] Primary Care Nikala Walsworth: [name] Other Clinician: [name] Referring Payden Bonus: Treating Tredarius Cobern/Extender: [name] in Treatment: 10 Encounter Discharge Information  Items Discharge Condition: Stable Ambulatory Status: Ambulatory Discharge Destination: Home Transportation: Private Auto Accompanied By: None Schedule Follow-up Appointment: Yes Clinical Summary of Care: Electronic Signature(s) Signed: 09/17/2021 3:48:46 PM By: [name] EMT Entered By: [name] on 09/17/2021 15:48:45 -------------------------------------------------------------------------------- Vitals Details Patient Name: Date of Service: [name], [name] 09/17/2021 8:00 A M Medical Record Number: [id number] Patient Account Number: [id number] Date of Birth/Sex: Treating RN: 01/08/1971 (51 y.o. [name], [name] Primary Care Uri Covey: [name] Other Clinician: [name] Referring Karlin Heilman: Treating Roderic Lammert/Extender: [name] in Treatment: 10 Vital Signs Time Taken: 08:03 Temperature (F): 98.3 Height (in): 72 Pulse (bpm): 75 Weight (lbs): 312 Respiratory Rate (breaths/min): 16 Body Mass Index (BMI): 42.3 Blood Pressure (mmHg): 133/83 Capillary Blood Glucose (mg/dl): 157 Reference Range: 80 - 120 mg / dl Electronic Signature(s) Signed: 09/17/2021 3:44:26 PM By: [name] EMT Entered By: [name] on 09/17/2021 15:44:26

## 2021-09-17 NOTE — Progress Notes (Addendum)
[name], [name] ([id number]) Visit Report for 09/17/2021 HBO Details Patient Name: Date of Service: [name], [place] [name] 09/17/2021 8:00 A M Medical Record Number: [id number] Patient Account Number: [id number] Date of Birth/Sex: Treating RN: December 17, 1970 [name], [name] Primary Care [name]: [name] Other Clinician: Valeria [name] Referring [name]: Treating [name]/Extender: [name] in Treatment: 10 HBO Treatment Course Details Treatment Course Number: 1 Ordering [name]: [name] T Treatments Ordered: otal 40 HBO Treatment Start Date: 08/14/2021 HBO Indication: Diabetic Ulcer(s) of the Lower Extremity Standard/Conservative Wound Care tried and failed greater than or equal to 30 days Wound #2 Right, Lateral, Plantar Foot , Wound #3R Right, Lateral Foot HBO Treatment Details Treatment Number: 25 Patient Type: Outpatient Chamber Type: Monoplace Chamber Serial #: [id number] Treatment Protocol: 2.0 ATA with 90 minutes oxygen, and no air breaks Treatment Details Compression Rate Down: 2.0 psi / minute De-Compression Rate Up: 2.0 psi / minute Air breaks and breathing Decompress Decompress Compress Tx Pressure Begins Reached periods Begins Ends (leave unused spaces blank) Chamber Pressure (ATA 1 2 ------2 1 ) Clock Time (24 hr) 08:17 08:25 - - - - - - 09:56 10:03 Treatment Length: 106 (minutes) Treatment Segments: 4 Vital Signs Capillary Blood Glucose Reference Range: 80 - 120 mg / dl HBO Diabetic Blood Glucose Intervention Range: <131 mg/dl or >249 mg/dl Time Vitals Blood Respiratory Capillary Blood Glucose Pulse Action Type: Pulse: Temperature: Taken: Pressure: Rate: Glucose (mg/dl): Meter #: Oximetry (%) Taken: Pre 08:03 133/83 75 16 98.3 157 Post 10:06 150/91 75 16 97.4 131 Treatment Response Treatment Toleration: Well Treatment Completion Status: Treatment Completed without Adverse Event Additional  Procedure Documentation Tissue Sevierity: Necrosis of bone Physician HBO Attestation: I certify that I supervised this HBO treatment in accordance with Medicare guidelines. A trained emergency response team is readily available per Yes hospital policies and procedures. Continue HBOT as ordered. Yes Electronic Signature(s) Signed: 09/17/2021 4:26:57 PM By: [name] Shan DO Previous Signature: 09/17/2021 3:47:43 PM Version By: [name] EMT Entered By: [name] on 09/17/2021 16:23:18 -------------------------------------------------------------------------------- HBO Safety Checklist Details Patient Name: Date of Service: [name], [name] 09/17/2021 8:00 A M Medical Record Number: [id number] Patient Account Number: [id number] Date of Birth/Sex: Treating RN: 09-18-1970 [name], [name] Primary Care [name]: [name] Other Clinician: Valeria [name] Referring [name]: Treating [name]/Extender: [name] in Treatment: 10 HBO Safety Checklist Items Safety Checklist Consent Form Signed Patient voided / foley secured and emptied When did you last eato 0645 Last dose of injectable or oral agent 0700 Ostomy pouch emptied and vented if applicable NA All implantable devices assessed, documented and approved PICC line Intravenous access site secured and place PICC Line Valuables secured Linens and cotton and cotton/polyester blend (less than 51% polyester) Personal oil-based products / skin lotions / body lotions removed Wigs or hairpieces removed NA Smoking or tobacco materials removed Books / newspapers / magazines / loose paper removed Cologne, aftershave, perfume and deodorant removed Jewelry removed (may wrap wedding band) NA Make-up removed NA Hair care products removed Battery operated devices (external) removed Heating patches and chemical warmers removed Titanium eyewear removed NA Nail polish cured greater  than 10 hours NA Casting material cured greater than 10 hours NA Hearing aids removed NA Loose dentures or partials removed NA Prosthetics have been removed NA Patient demonstrates correct use of air break device (if applicable) Patient concerns have been addressed Patient grounding bracelet on and cord attached to chamber  Specifics for Inpatients (complete in addition to above) Medication sheet sent with patient NA Intravenous medications needed or due during therapy sent with patient NA Drainage tubes (e.g. nasogastric tube or chest tube secured and vented) NA Endotracheal or Tracheotomy tube secured NA Cuff deflated of air and inflated with saline NA Airway suctioned NA Notes The safety checklist was done before the treatment was started Electronic Signature(s) Signed: 09/17/2021 3:45:42 PM By: [name] EMT Entered By: [name] on 09/17/2021 15:45:41

## 2021-09-18 ENCOUNTER — Encounter (HOSPITAL_BASED_OUTPATIENT_CLINIC_OR_DEPARTMENT_OTHER): Payer: BLUE CROSS/BLUE SHIELD | Admitting: General Surgery

## 2021-09-18 DIAGNOSIS — E11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ical complication: Secondary | ICD-10-CM | POA: Diagnosis not present

## 2021-09-18 LAB — GLUCOSE, CAPILLARY
Glucose-Capillary: 111 mg/dL — ABNORMAL HIGH (ref 70–99)
Glucose-Capillary: 129 mg/dL — ABNORMAL HIGH (ref 70–99)
Glucose-Capillary: 175 mg/dL — ABNORMAL HIGH (ref 70–99)

## 2021-09-18 NOTE — Progress Notes (Addendum)
[name], Keionte M. ([id number]) Visit Report for 09/18/2021 Arrival Information Details Patient Name: Date of Service: [place], [place] [name] 09/18/2021 8:00 A M Medical Record Number: [id number] Patient Account Number: [id number] Date of Birth/Sex: Treating RN: 02/02/1970 (51 y.o. [name], [name] Primary Care Braedon Sjogren: [name] Other Clinician: [name] Referring Salam [name]: Treating Roshan Salamon/Extender: [name] in Treatment: 10 Visit Information History Since Last Visit All ordered tests and consults were completed: Yes Patient Arrived: Ambulatory Added or deleted any medications: No Arrival Time: 07:45 Any new allergies or adverse reactions: No Accompanied By: None Had a fall or experienced change in No Transfer Assistance: None activities of daily living that may affect Patient Identification Verified: Yes risk of falls: Secondary Verification Process Completed: Yes Signs or symptoms of abuse/neglect since last visito No Patient Requires Transmission-Based Precautions: No Hospitalized since last visit: No Patient Has Alerts: Yes Implantable device outside of the clinic excluding No cellular tissue based products placed in the center since last visit: Pain Present Now: No Electronic Signature(s) Signed: 09/18/2021 11:08:14 AM By: [name] EMT Entered By: [name] on 09/18/2021 11:08:14 -------------------------------------------------------------------------------- Encounter Discharge Information Details Patient Name: Date of Service: [name], [name] 09/18/2021 8:00 A M Medical Record Number: [id number] Patient Account Number: [id number] Date of Birth/Sex: Treating RN: 11/23/1970 (51 y.o. [name] Primary Care Corvette Orser: [name] Other Clinician: [name] Referring Kamden Reber: Treating Lawton Dollinger/Extender: [name] in Treatment: 10 Encounter Discharge Information  Items Discharge Condition: Stable Ambulatory Status: Ambulatory Discharge Destination: Home Transportation: Private Auto Accompanied By: None Schedule Follow-up Appointment: No Clinical Summary of Care: Electronic Signature(s) Signed: 09/18/2021 11:15:59 AM By: [name] EMT Entered By: [name] on 09/18/2021 11:15:58 -------------------------------------------------------------------------------- Vitals Details Patient Name: Date of Service: [name], [name] 09/18/2021 8:00 A M Medical Record Number: [id number] Patient Account Number: [id number] Date of Birth/Sex: Treating RN: 1970-11-28 (50 y.o. [name], [name] Primary Care Ryun Velez: [name] Other Clinician: [name] Referring Quincie Haroon: Treating Alyssamae Klinck/Extender: [name] in Treatment: 10 Vital Signs Time Taken: 07:57 Temperature (F): 98.2 Height (in): 72 Pulse (bpm): 93 Weight (lbs): 312 Respiratory Rate (breaths/min): 16 Body Mass Index (BMI): 42.3 Blood Pressure (mmHg): 125/77 Capillary Blood Glucose (mg/dl): 111 Reference Range: 80 - 120 mg / dl Electronic Signature(s) Signed: 09/18/2021 11:09:17 AM By: [name] EMT Entered By: [name] on 09/18/2021 11:09:17

## 2021-09-18 NOTE — Progress Notes (Addendum)
[name], Dain M. ([id number]) Visit Report for 09/18/2021 HBO Details Patient Name: Date of Service: [name], [place] [name] 09/18/2021 8:00 A M Medical Record Number: [id number] Patient Account Number: [id number] Date of Birth/Sex: Treating RN: 1970/04/17 (51 y.o. [name], [name] Primary Care Marcello Tuzzolino: [name] Other Clinician: [name] Referring Corean Yoshimura: Treating Norberto Wishon/Extender: [name] in Treatment: 10 HBO Treatment Course Details Treatment Course Number: 1 Ordering Jahziah Simonin: [name] T Treatments Ordered: otal 40 HBO Treatment Start Date: 08/14/2021 HBO Indication: Diabetic Ulcer(s) of the Lower Extremity Standard/Conservative Wound Care tried and failed greater than or equal to 30 days Wound #2 Right, Lateral, Plantar Foot , Wound #3R Right, Lateral Foot HBO Treatment Details Treatment Number: 26 Patient Type: Outpatient Chamber Type: Monoplace Chamber Serial #: [id number] Treatment Protocol: 2.0 ATA with 90 minutes oxygen, and no air breaks Treatment Details Compression Rate Down: 2.0 psi / minute De-Compression Rate Up: 2.0 psi / minute Air breaks and breathing Decompress Decompress Compress Tx Pressure Begins Reached periods Begins Ends (leave unused spaces blank) Chamber Pressure (ATA 1 2 ------2 1 ) Clock Time (24 hr) 08:30 08:39 - - - - - - 10:09 10:17 Treatment Length: 107 (minutes) Treatment Segments: 4 Vital Signs Capillary Blood Glucose Reference Range: 80 - 120 mg / dl HBO Diabetic Blood Glucose Intervention Range: <131 mg/dl or >249 mg/dl Type: Time Vitals Blood Pulse: Respiratory Capillary Blood Glucose Pulse Action Temperature: Taken: Pressure: Rate: Glucose (mg/dl): Meter #: Oximetry (%) Taken: Pre 07:57 125/77 93 16 98.2 111 Patient given 8 oz glucerna Post 10:21 140/86 69 16 97.2 175 Pre 08:27 129 Treatment Response Treatment Toleration: Well Treatment Completion Status: Treatment Completed  without Adverse Event Treatment Notes Informed [name] of the patient blood glucose Additional Procedure Documentation Tissue Sevierity: Necrosis of bone Physician HBO Attestation: I certify that I supervised this HBO treatment in accordance with Medicare guidelines. A trained emergency response team is readily available per Yes hospital policies and procedures. Continue HBOT as ordered. Yes Electronic Signature(s) Signed: 09/18/2021 12:14:37 PM By: [name] MD FACS Previous Signature: 09/18/2021 11:14:48 AM Version By: [name] EMT Entered By: [name] on 09/18/2021 12:14:36 -------------------------------------------------------------------------------- HBO Safety Checklist Details Patient Name: Date of Service: [name], [name] 09/18/2021 8:00 A M Medical Record Number: [id number] Patient Account Number: [id number] Date of Birth/Sex: Treating RN: July 11, 1970 (51 y.o. [name], [name] Primary Care Jaidee Stipe: [name] Other Clinician: [name] Referring Levita Monical: Treating Nikan Ellingson/Extender: [name] in Treatment: 10 HBO Safety Checklist Items Safety Checklist Consent Form Signed Patient voided / foley secured and emptied When did you last eato 0710 Last dose of injectable or oral agent 0650 Ostomy pouch emptied and vented if applicable NA All implantable devices assessed, documented and approved PICC line Intravenous access site secured and place PICC Line Valuables secured Linens and cotton and cotton/polyester blend (less than 51% polyester) Personal oil-based products / skin lotions / body lotions removed Wigs or hairpieces removed NA Smoking or tobacco materials removed Books / newspapers / magazines / loose paper removed Cologne, aftershave, perfume and deodorant removed Jewelry removed (may wrap wedding band) NA Make-up removed NA Hair care products removed Battery operated devices (external)  removed Heating patches and chemical warmers removed Titanium eyewear removed NA Nail polish cured greater than 10 hours NA Casting material cured greater than 10 hours NA Hearing aids removed NA Loose dentures or partials removed NA Prosthetics have been removed NA Patient demonstrates correct use of  air break device (if applicable) Patient concerns have been addressed Patient grounding bracelet on and cord attached to chamber Specifics for Inpatients (complete in addition to above) Medication sheet sent with patient NA Intravenous medications needed or due during therapy sent with patient NA Drainage tubes (e.g. nasogastric tube or chest tube secured and vented) NA Endotracheal or Tracheotomy tube secured NA Cuff deflated of air and inflated with saline NA Airway suctioned NA Notes The safety checklist was done before the treatment was started Electronic Signature(s) Signed: 09/18/2021 11:11:31 AM By: [name] EMT Entered By: [name] on 09/18/2021 11:11:31

## 2021-09-18 NOTE — Progress Notes (Signed)
[name], [name] ([id number]) Visit Report for 09/18/2021 Problem List Details Patient Name: Date of Service: [name], [place] [name] 09/18/2021 8:00 A M Medical Record Number: [id number] Patient Account Number: [id number] Date of Birth/Sex: Treating RN: 07/20/1970 (51 y.o. 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 09/18/2021 Medical Record Number: [id number] Patient Account Number: [id number] Date of Birth/Sex: Treating RN: 08/18/1970 (51 y.o. 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 LZ85.885Non-pressure chronic ulcer of other part of right foot with necrosis  of bone M86.671 Other chronic osteomyelitis, right ankle and foot Modifier: Quantity: 4 Physician Procedures : CPT4 Code Description Modifier [id number]- WC PHYS HYPERBARIC OXYGEN THERAPY ICD-10 Diagnosis Description 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

## 2021-09-19 ENCOUNTER — Encounter (HOSPITAL_BASED_OUTPATIENT_CLINIC_OR_DEPARTMENT_OTHER): Payer: BLUE CROSS/BLUE SHIELD | Admitting: General Surgery

## 2021-09-19 DIAGNOSIS — E11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ical complication: Secondary | ICD-10-CM | POA: Diagnosis not present

## 2021-09-19 LAB — GLUCOSE, CAPILLARY
Glucose-Capillary: 171 mg/dL — ABNORMAL HIGH (ref 70–99)
Glucose-Capillary: 206 mg/dL — ABNORMAL HIGH (ref 70–99)

## 2021-09-19 NOTE — Progress Notes (Addendum)
[name], [name] ([id number]) Visit Report for 09/19/2021 HBO Details Patient Name: Date of [place]: [name], [place] [name] 09/19/2021 8:00 A M Medical Record Number: [id number] Patient Account Number: [id number] Date of Birth/Sex: Treating RN: 1970/12/05 [name] Primary Care [name]: [name] Other Clinician: [name] Referring [name]: Treating [name]/Extender: [name] in Treatment: 11 HBO Treatment Course Details Treatment Course Number: 1 Ordering [name]: [name] T Treatments Ordered: otal 40 HBO Treatment Start Date: 08/14/2021 HBO Indication: Diabetic Ulcer(s) of the Lower Extremity Standard/Conservative Wound Care tried and failed greater than or equal to 30 days Wound #2 Right, Lateral, Plantar Foot , Wound #3R Right, Lateral Foot HBO Treatment Details Treatment Number: 27 Patient Type: Outpatient Chamber Type: Monoplace Chamber Serial #: [id number] Treatment Protocol: 2.0 ATA with 90 minutes oxygen, and no air breaks Treatment Details Compression Rate Down: 2.0 psi / minute De-Compression Rate Up: 2.0 psi / minute Air breaks and breathing Decompress Decompress Compress Tx Pressure Begins Reached periods Begins Ends (leave unused spaces blank) Chamber Pressure (ATA 1 2 ------2 1 ) Clock Time (24 hr) 07:58 08:11 - - - - - - 09:41 09:49 Treatment Length: 111 (minutes) Treatment Segments: 4 Vital Signs Capillary Blood Glucose Reference Range: 80 - 120 mg / dl HBO Diabetic Blood Glucose Intervention Range: <131 mg/dl or >249 mg/dl Time Vitals Blood Respiratory Capillary Blood Glucose Pulse Action Type: Pulse: Temperature: Taken: Pressure: Rate: Glucose (mg/dl): Meter #: Oximetry (%) Taken: Pre 07:37 156/94 82 18 98.8 177 Post 09:54 139/86 70 16 98.3 206 Treatment Response Treatment Toleration: Well Treatment Completion Status: Treatment Completed without Adverse Event Additional  Procedure Documentation Tissue Sevierity: Necrosis of bone Physician HBO Attestation: I certify that I supervised this HBO treatment in accordance with Medicare guidelines. A trained emergency response team is readily available per Yes hospital policies and procedures. Continue HBOT as ordered. Yes Electronic Signature(s) Signed: 09/19/2021 2:43:24 PM By: [name] MD FACS Previous Signature: 09/19/2021 1:26:00 PM Version By: [name] EMT Entered By: [name] on 09/19/2021 14:43:24 -------------------------------------------------------------------------------- HBO Safety Checklist Details Patient Name: Date of [place]: [name], [name] 09/19/2021 8:00 A M Medical Record Number: [id number] Patient Account Number: [id number] Date of Birth/Sex: Treating RN: 12-20-1970 [name] Primary Care [name]: [name] Other Clinician: [name] Referring Ambre Kobayashi: Treating [name]/Extender: [name] in Treatment: 11 HBO Safety Checklist Items Safety Checklist Consent Form Signed Patient voided / foley secured and emptied When did you last eato 0615 Last dose of injectable or oral agent 0645 Ostomy pouch emptied and vented if applicable NA All implantable devices assessed, documented and approved PICC line Intravenous access site secured and place PICC Line Valuables secured Linens and cotton and cotton/polyester blend (less than 51% polyester) Personal oil-based products / skin lotions / body lotions removed Wigs or hairpieces removed NA Smoking or tobacco materials removed Books / newspapers / magazines / loose paper removed Cologne, aftershave, perfume and deodorant removed Jewelry removed (may wrap wedding band) NA Make-up removed NA Hair care products removed Battery operated devices (external) removed Heating patches and chemical warmers removed Titanium eyewear removed NA Nail polish cured  greater than 10 hours NA Casting material cured greater than 10 hours NA Hearing aids removed NA Loose dentures or partials removed NA Prosthetics have been removed NA Patient demonstrates correct use of air break device (if applicable) Patient concerns have been addressed Patient grounding bracelet on and cord attached to  chamber Specifics for Inpatients (complete in addition to above) Medication sheet sent with patient NA Intravenous medications needed or due during therapy sent with patient NA Drainage tubes (e.g. nasogastric tube or chest tube secured and vented) NA Endotracheal or Tracheotomy tube secured NA Cuff deflated of air and inflated with saline NA Airway suctioned NA Notes The safety checklist was done before the treatment was started Electronic Signature(s) Signed: 09/19/2021 1:24:49 PM By: [name] EMT Entered By: [name] on 09/19/2021 13:24:48

## 2021-09-19 NOTE — Progress Notes (Addendum)
[name], [name] ([id number]) Visit Report for 09/19/2021 Arrival Information Details Patient Name: Date of Service: [place], [place] [name] 09/19/2021 8:00 A M Medical Record Number: [id number] Patient Account Number: [id number] Date of Birth/Sex: Treating RN: 1970/10/08 [name], [name] Primary Care [name]: [name] Other Clinician: [name] Referring Lashya Passe: Treating [name]/Extender: [name] in Treatment: 11 Visit Information History Since Last Visit All ordered tests and consults were completed: Yes Patient Arrived: Ambulatory Added or deleted any medications: No Arrival Time: 07:35 Any new allergies or adverse reactions: No Accompanied By: None Had a fall or experienced change in No Transfer Assistance: None activities of daily living that may affect Patient Identification Verified: Yes risk of falls: Secondary Verification Process Completed: Yes Signs or symptoms of abuse/neglect since last visito No Patient Requires Transmission-Based Precautions: No Hospitalized since last visit: No Patient Has Alerts: Yes Implantable device outside of the clinic excluding No cellular tissue based products placed in the center since last visit: Pain Present Now: No Electronic Signature(s) Signed: 09/19/2021 1:23:19 PM By: [name] EMT Entered By: [name] on 09/19/2021 13:23:18 -------------------------------------------------------------------------------- Encounter Discharge Information Details Patient Name: Date of Service: [name], [name] 09/19/2021 8:00 A M Medical Record Number: [id number] Patient Account Number: [id number] Date of Birth/Sex: Treating RN: [birth year] [name] Primary Care Beyonce Sawatzky: [name] Other Clinician: [name] Referring Shane Melby: Treating Shalanda Brogden/Extender: [name] in Treatment: 11 Encounter Discharge Information  Items Discharge Condition: Stable Ambulatory Status: Ambulatory Discharge Destination: Home Transportation: Private Auto Accompanied By: None Schedule Follow-up Appointment: Yes Clinical Summary of Care: Electronic Signature(s) Signed: 09/19/2021 1:26:45 PM By: [name] EMT Entered By: [name] on 09/19/2021 13:26:45 -------------------------------------------------------------------------------- Vitals Details Patient Name: Date of Service: [name], [name] 09/19/2021 8:00 A M Medical Record Number: [id number] Patient Account Number: [id number] Date of Birth/Sex: Treating RN: 05/10/1970 [name] Primary Care Shawne Bulow: [name] Other Clinician: [name] Referring Franchot Pollitt: Treating Teyona Nichelson/Extender: [name] in Treatment: 11 Vital Signs Time Taken: 07:37 Temperature (F): 98.8 Height (in): 72 Pulse (bpm): 82 Weight (lbs): 312 Respiratory Rate (breaths/min): 18 Body Mass Index (BMI): 42.3 Blood Pressure (mmHg): 156/94 Capillary Blood Glucose (mg/dl): 177 Reference Range: 80 - 120 mg / dl Electronic Signature(s) Signed: 09/19/2021 1:23:47 PM By: [name] EMT Entered By: [name] on 09/19/2021 13:23:47

## 2021-09-19 NOTE — Progress Notes (Signed)
[name], Delford M. ([id number]) Visit Report for 09/19/2021 SuperBill Details Patient Name: Date of Service: [place], [place] [name] 09/19/2021 Medical Record Number: [id number] Patient Account Number: [id number] Date of Birth/Sex: Treating RN: 1970/09/10 (51 y.o. 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 LZ60.109Non-pressure chronic ulcer of other part of right foot with necrosis of bone M86.671 Other chronic osteomyelitis, right ankle and foot Physician Procedures Quantity CPT4 Code Description Modifier [id number] 22025- WC PHYS HYPERBARIC OXYGEN THERAPY 1 ICD-10 Diagnosis Description 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

## 2021-09-20 ENCOUNTER — Encounter (HOSPITAL_BASED_OUTPATIENT_CLINIC_OR_DEPARTMENT_OTHER): Payer: BLUE CROSS/BLUE SHIELD | Attending: Internal Medicine | Admitting: Internal Medicine

## 2021-09-20 DIAGNOSIS — L97514 Non-pressure chronic ulcer of other part of right foot with necrosis of bone: Secondary | ICD-10-CM | POA: Diagnosis not present

## 2021-09-20 DIAGNOSIS — M86671 Other chronic osteomyelitis, right ankle and foot: Secondary | ICD-10-CM | POA: Diagnosis not present

## 2021-09-20 DIAGNOSIS — E1149 Type 2 diabetes mellitus with other diabetic neurological complication: Secondary | ICD-10-CM | POA: Diagnosis not present

## 2021-09-20 DIAGNOSIS — E1169 Type 2 diabetes mellitus with other specified complication: Secondary | ICD-10-CM | POA: Diagnosis present

## 2021-09-20 LAB — GLUCOSE, CAPILLARY
Glucose-Capillary: 141 mg/dL — ABNORMAL HIGH (ref 70–99)
Glucose-Capillary: 130 mg/dL — ABNORMAL HIGH (ref 70–99)

## 2021-09-20 NOTE — Progress Notes (Signed)
[name], [name] ([id number]) Visit Report for 09/20/2021 Arrival Information Details Patient Name: Date of Service: [name], [name] 09/20/2021 8:00 A M Medical Record Number: [id number] Patient Account Number: [id number] Date of Birth/Sex: Treating RN: [birth year] [name], [place] Primary Care Lavonte Palos: [name] Other Clinician: [name] Referring Jamesia Linnen: Treating Houston Surges/Extender: [name]1 Visit Information History Since Last Visit All ordered tests and consults were completed: Yes Patient Arrived: Ambulatory Added or deleted any medications: No Arrival Time: 07:58 Any new allergies or adverse reactions: No Accompanied By: None Had a fall or experienced change [name] No Transfer Assistance: None activities of daily living that may affect Patient Identification Verified: Yes risk of falls: Secondary Verification Process Completed: Yes Signs or symptoms of abuse/neglect since last visito No Patient Requires Transmission-Based Precautions: No Hospitalized since last visit: No Patient Has Alerts: Yes Implantable device outside of the clinic excluding No cellular tissue based products placed [name] the center since last visit: Pain Present Now: No Electronic Signature(s) Signed: 09/20/2021 11:47:08 AM By: [name] EMT Entered By: [name] on 09/20/2021 11:47:08 -------------------------------------------------------------------------------- Encounter Discharge Information Details Patient Name: Date of Service: [name], [name] 09/20/2021 8:00 A M Medical Record Number: [id number] Patient Account Number: [id number] Date of Birth/Sex: Treating RN: 06-05-1970 [name] Primary Care Erine Phenix: [name] Other Clinician: [name] Referring Salahuddin Arismendez: Treating Gargi Berch/Extender: [name]1 Encounter Discharge Information Items Discharge  Condition: Stable Ambulatory Status: Ambulatory Discharge Destination: Home Transportation: Private Auto Accompanied By: None Schedule Follow-up Appointment: Yes Clinical Summary of Care: Electronic Signature(s) Signed: 09/20/2021 11:53:13 AM By: [name] EMT Entered By: [name] on 09/20/2021 11:53:13 -------------------------------------------------------------------------------- Vitals Details Patient Name: Date of Service: [name], [name] 09/20/2021 8:00 A M Medical Record Number: [id number] Patient Account Number: [id number] Date of Birth/Sex: Treating RN: 07-08-1970 [name] Primary Care Adrean Heitz: [name] Other Clinician: [name] Referring Kerrin Markman: Treating [name]/Extender: [name]1 Vital Signs Time Taken: 08:12 Temperature (F): 97.9 Height ([name]: 72 Pulse (bpm): 78 Weight (lbs): 312 Respiratory Rate (breaths/min): 16 Body Mass Index (BMI): 42.3 Blood Pressure (mmHg): 156/87 Capillary Blood Glucose (mg/dl): 141 Reference Range: 80 - 120 mg / dl Electronic Signature(s) Signed: 09/20/2021 11:47:50 AM By: [name] EMT Entered By: [name] on 09/20/2021 11:47:50

## 2021-09-20 NOTE — Progress Notes (Signed)
[name], [name] ([id number]) Visit Report for 09/20/2021 HBO Details Patient Name: Date of Service: [name], [name] 09/20/2021 8:00 A M Medical Record Number: [id number] Patient Account Number: [id number] Date of Birth/Sex: Treating RN: [birth year] [name] Session Primary Care Anelle Parlow: [name] Other Clinician: [name] Referring Keisa Blow: Treating Ismar Yabut/Extender: [name] in Treatment: 11 HBO Treatment Course Details Treatment Course Number: 1 Ordering Lavinia Mcneely: [name] T Treatments Ordered: otal 40 HBO Treatment Start Date: 08/14/2021 HBO Indication: Diabetic Ulcer(s) of the Lower Extremity Standard/Conservative Wound Care tried and failed greater than or equal to 30 days Wound #2 Right, Lateral, Plantar Foot , Wound #3R Right, Lateral Foot HBO Treatment Details Treatment Number: 28 Patient Type: Outpatient Chamber Type: Monoplace Chamber Serial #: [id number] Treatment Protocol: 2.0 ATA with 90 minutes oxygen, and no air breaks Treatment Details Compression Rate Down: 2.0 psi / minute De-Compression Rate Up: 2.0 psi / minute Air breaks and breathing Decompress Decompress Compress Tx Pressure Begins Reached periods Begins Ends (leave unused spaces blank) Chamber Pressure (ATA 1 2 ------2 1 ) Clock Time (24 hr) 08:19 08:27 - - - - - - 09:57 10:05 Treatment Length: 106 (minutes) Treatment Segments: 4 Vital Signs Capillary Blood Glucose Reference Range: 80 - 120 mg / dl HBO Diabetic Blood Glucose Intervention Range: <131 mg/dl or >249 mg/dl Time Vitals Blood Respiratory Capillary Blood Glucose Pulse Action Type: Pulse: Temperature: Taken: Pressure: Rate: Glucose (mg/dl): Meter #: Oximetry (%) Taken: Pre 08:12 156/87 78 16 97.9 141 Post 10:09 157/94 67 16 97.2 130 Treatment Response Treatment Toleration: Well Treatment Completion Status: Treatment Completed without Adverse Event Additional Procedure  Documentation Tissue Sevierity: Necrosis of bone Physician HBO Attestation: I certify that I supervised this HBO treatment in accordance with Medicare guidelines. A trained emergency response team is readily available per Yes hospital policies and procedures. Continue HBOT as ordered. Yes Electronic Signature(s) Signed: 09/24/2021 12:26:30 PM By: [name] DO Previous Signature: 09/20/2021 11:52:12 AM Version By: [name] EMT Entered By: [name] on 09/20/2021 12:47:39 -------------------------------------------------------------------------------- HBO Safety Checklist Details Patient Name: Date of Service: [name], [name] 09/20/2021 8:00 A M Medical Record Number: [id number] Patient Account Number: [id number] Date of Birth/Sex: Treating RN: 1970/07/15 [name] Session Primary Care Billi Bright: [name] Other Clinician: [name] Referring Raena Pau: Treating Cadence Minton/Extender: [name] in Treatment: 11 HBO Safety Checklist Items Safety Checklist Consent Form Signed Patient voided / foley secured and emptied When did you last eato 0715 Last dose of injectable or oral agent 0700 Ostomy pouch emptied and vented if applicable NA All implantable devices assessed, documented and approved PICC line Intravenous access site secured and place PICC Line Valuables secured Linens and cotton and cotton/polyester blend (less than 51% polyester) Personal oil-based products / skin lotions / body lotions removed Wigs or hairpieces removed NA Smoking or tobacco materials removed Books / newspapers / magazines / loose paper removed Cologne, aftershave, perfume and deodorant removed Jewelry removed (may wrap wedding band) NA Make-up removed NA Hair care products removed Battery operated devices (external) removed Heating patches and chemical warmers removed Titanium eyewear removed NA Nail polish cured greater than 10  hours NA Casting material cured greater than 10 hours NA Hearing aids removed NA Loose dentures or partials removed NA Prosthetics have been removed NA Patient demonstrates correct use of air break device (if applicable) Patient concerns have been addressed Patient grounding bracelet on and cord attached to chamber  Specifics for Inpatients (complete in addition to above) Medication sheet sent with patient NA Intravenous medications needed or due during therapy sent with patient NA Drainage tubes (e.g. nasogastric tube or chest tube secured and vented) NA Endotracheal or Tracheotomy tube secured NA Cuff deflated of air and inflated with saline NA Airway suctioned NA Notes The safety checklist was done before the treatment was started Electronic Signature(s) Signed: 09/20/2021 11:49:19 AM By: [name] EMT Entered By: [name] on 09/20/2021 11:49:18

## 2021-09-24 ENCOUNTER — Encounter (HOSPITAL_BASED_OUTPATIENT_CLINIC_OR_DEPARTMENT_OTHER): Payer: BLUE CROSS/BLUE SHIELD | Admitting: General Surgery

## 2021-09-24 ENCOUNTER — Encounter (HOSPITAL_BASED_OUTPATIENT_CLINIC_OR_DEPARTMENT_OTHER): Payer: BLUE CROSS/BLUE SHIELD | Admitting: Internal Medicine

## 2021-09-24 DIAGNOSIS — E1169 Type 2 diabetes mellitus with other specified complication: Secondary | ICD-10-CM | POA: Diagnosis not present

## 2021-09-24 DIAGNOSIS — E1149 Type 2 diabetes mellitus with other diabetic neurological complication: Secondary | ICD-10-CM | POA: Diagnosis not present

## 2021-09-24 DIAGNOSIS — L97514 Non-pressure chronic ulcer of other part of right foot with necrosis of bone: Secondary | ICD-10-CM | POA: Diagnosis not present

## 2021-09-24 DIAGNOSIS — M86671 Other chronic osteomyelitis, right ankle and foot: Secondary | ICD-10-CM

## 2021-09-24 LAB — GLUCOSE, CAPILLARY
Glucose-Capillary: 143 mg/dL — ABNORMAL HIGH (ref 70–99)
Glucose-Capillary: 128 mg/dL — ABNORMAL HIGH (ref 70–99)

## 2021-09-24 NOTE — Progress Notes (Signed)
[name], Muscab M. ([id number]) Visit Report for 09/24/2021 Arrival Information Details Patient Name: Date of Service: [place], [place] [name] 09/24/2021 10:45 A M Medical Record Number: [id number] Patient Account Number: [id number] Date of Birth/Sex: Treating RN: 02/08/1970 (51 y.o. [name] Primary Care Akirra Lacerda: [name] Other Clinician: Referring Kaysey Berndt: Treating Destini Cambre/Extender: [name] in Treatment: 11 Visit Information History Since Last Visit Added or deleted any medications: No Patient Arrived: Ambulatory Any new allergies or adverse reactions: No Arrival Time: 10:25 Had a fall or experienced change in No Accompanied By: self activities of daily living that may affect Transfer Assistance: None risk of falls: Patient Identification Verified: Yes Signs or symptoms of abuse/neglect since last visito No Secondary Verification Process Completed: Yes Hospitalized since last visit: No Patient Requires Transmission-Based Precautions: No Implantable device outside of the clinic excluding No Patient Has Alerts: Yes cellular tissue based products placed in the center since last visit: Has Dressing in Place as Prescribed: Yes Pain Present Now: No Electronic Signature(s) Signed: 09/24/2021 5:49:01 PM By: Adline Peals Entered By: Adline Peals on 09/24/2021 10:27:55 -------------------------------------------------------------------------------- Encounter Discharge Information Details Patient Name: Date of Service: [place], [name] 09/24/2021 10:45 A M Medical Record Number: [id number] Patient Account Number: [id number] Date of Birth/Sex: Treating RN: 08-Oct-1970 (51 y.o. [name] Primary Care Lorilee Cafarella: [name] Other Clinician: Referring Maela Takeda: Treating Mykai Wendorf/Extender: [name] in Treatment: 11 Encounter Discharge Information Items Post Procedure Vitals Discharge  Condition: Stable Temperature (F): 97.2 Ambulatory Status: Ambulatory Pulse (bpm): 72 Discharge Destination: Home Respiratory Rate (breaths/min): 16 Transportation: Private Auto Blood Pressure (mmHg): 146/97 Accompanied By: self Schedule Follow-up Appointment: Yes Clinical Summary of Care: Patient Declined Electronic Signature(s) Signed: 09/24/2021 5:49:01 PM By: Adline Peals Entered By: Adline Peals on 09/24/2021 11:00:35 -------------------------------------------------------------------------------- Lower Extremity Assessment Details Patient Name: Date of Service: [name], [name] 09/24/2021 10:45 A M Medical Record Number: [id number] Patient Account Number: [id number] Date of Birth/Sex: Treating RN: 08-04-1970 (51 y.o. [name] Primary Care Leor Whyte: [name] Other Clinician: Referring Media Pizzini: Treating Jailyn Leeson/Extender: [name] in Treatment: 11 Edema Assessment Assessed: [name]: No] [name]: No] Edema: [Left: N] [Right: o] Calf Left: Right: Point of Measurement: From Medial Instep 41.3 cm Ankle Left: Right: Point of Measurement: From Medial Instep 24.4 cm Vascular Assessment Pulses: Dorsalis Pedis Palpable: [Right:Yes] Electronic Signature(s) Signed: 09/24/2021 5:49:01 PM By: Adline Peals Entered By: Adline Peals on 09/24/2021 10:30:32 -------------------------------------------------------------------------------- Multi Wound Chart Details Patient Name: Date of Service: [name], [name] 09/24/2021 10:45 A M Medical Record Number: [id number] Patient Account Number: [id number] Date of Birth/Sex: Treating RN: [birth year] (51 y.o. M) Primary Care Nishi Neiswonger: [name] Other Clinician: Referring Naomii Kreger: Treating Thailan Sava/Extender: [name] in Treatment: 11 Vital Signs Height(in): 72 Capillary Blood Glucose(mg/dl): 128 Weight(lbs): 312 Pulse(bpm):  19 Body Mass Index(BMI): 42.3 Blood Pressure(mmHg): 146/97 Temperature(F): 97.2 Respiratory Rate(breaths/min): 16 Photos: [2:Right, Lateral, Plantar Foot] [N/A:N/A N/A] Wound Location: [2:Gradually Appeared] [N/A:N/A] Wounding Event: [2:Diabetic Wound/Ulcer of the Lower] [N/A:N/A] Primary Etiology: [2:Extremity Type II Diabetes] [N/A:N/A] Comorbid History: [2:12/19/2016] [N/A:N/A] Date Acquired: [2:11] [N/A:N/A] Weeks of Treatment: [2:Open] [N/A:N/A] Wound Status: [2:No] [N/A:N/A] Wound Recurrence: [2:0.1x1.7x0.6] [N/A:N/A] Measurements L x W x D (cm) [2:0.134] [N/A:N/A] A (cm) : rea [2:0.08] [N/A:N/A] Volume (cm) : [2:96.30%] [N/A:N/A] % Reduction in A [2:rea: 99.60%] [N/A:N/A] % Reduction in Volume: [2:Grade 3] [N/A:N/A] Classification: [2:Medium] [N/A:N/A] Exudate A mount: [2:Serosanguineous] [N/A:N/A] Exudate Type: [2:red, brown] [N/A:N/A] Exudate  Color: [2:Thickened] [N/A:N/A] Wound Margin: [2:Large (67-100%)] [N/A:N/A] Granulation A mount: [2:Pink] [N/A:N/A] Granulation Quality: [2:Small (1-33%)] [N/A:N/A] Necrotic A mount: [2:Eschar, Adherent Slough] [N/A:N/A] Necrotic Tissue: [2:Fat Layer (Subcutaneous Tissue): Yes N/A] Exposed Structures: [2:Fascia: No Tendon: No Muscle: No Joint: No Bone: No Small (1-33%)] [N/A:N/A] Epithelialization: [2:Debridement - Excisional] [N/A:N/A] Debridement: Pre-procedure Verification/Time Out 10:40 [N/A:N/A] Taken: [2:Lidocaine 5% topical ointment] [N/A:N/A] Pain Control: [2:Callus, Subcutaneous] [N/A:N/A] Tissue Debrided: [2:Skin/Subcutaneous Tissue] [N/A:N/A] Level: [2:0.17] [N/A:N/A] Debridement A (sq cm): [2:rea Curette] [N/A:N/A] Instrument: [2:Minimum] [N/A:N/A] Bleeding: [2:Pressure] [N/A:N/A] Hemostasis A chieved: [2:0] [N/A:N/A] Procedural Pain: [2:0] [N/A:N/A] Post Procedural Pain: [2:Procedure was tolerated well] [N/A:N/A] Debridement Treatment Response: [2:0.1x1.7x0.6] [N/A:N/A] Post Debridement Measurements L x W x  D (cm) [2:0.08] [N/A:N/A] Post Debridement Volume: (cm) [2:Debridement] [N/A:N/A] Treatment Notes Electronic Signature(s) Signed: 09/24/2021 10:59:45 AM By: [name] MD FACS Entered By: [name] on 09/24/2021 10:59:45 -------------------------------------------------------------------------------- Multi-Disciplinary Care Plan Details Patient Name: Date of Service: [name], [name] 09/24/2021 10:45 A M Medical Record Number: [id number] Patient Account Number: [id number] Date of Birth/Sex: Treating RN: [birth year] (51 y.o. [name] Primary Care Arless Vineyard: [name] Other Clinician: Referring Leeland Lovelady: Treating Tymeka Privette/Extender: [name] in Treatment: [place] reviewed with physician Active Inactive Nutrition Nursing Diagnoses: Impaired glucose control: actual or potential Potential for alteratiion in Nutrition/Potential for imbalanced nutrition Goals: Patient/caregiver will maintain therapeutic glucose control Date Initiated: 07/29/2021 Target Resolution Date: 11/19/2021 Goal Status: Active Interventions: Assess patient nutrition upon admission and as needed per policy Provide education on elevated blood sugars and impact on wound healing Treatment Activities: Patient referred to Primary Care Physician for further nutritional evaluation : 07/29/2021 Notes: Osteomyelitis Nursing Diagnoses: Infection: osteomyelitis Knowledge deficit related to disease process and management Goals: Patient/caregiver will verbalize understanding of disease process and disease management Date Initiated: 07/29/2021 Target Resolution Date: 11/19/2021 Goal Status: Active Patient's osteomyelitis will resolve Date Initiated: 07/29/2021 Target Resolution Date: 11/19/2021 Goal Status: Active Interventions: Assess for signs and symptoms of osteomyelitis resolution every visit Provide education on  osteomyelitis Treatment Activities: Consult for HBO : 07/29/2021 Notes: Wound/Skin Impairment Nursing Diagnoses: Impaired tissue integrity Goals: Patient/caregiver will verbalize understanding of skin care regimen Date Initiated: 07/04/2021 Target Resolution Date: 11/19/2021 Goal Status: Active Interventions: Assess ulceration(s) every visit Treatment Activities: Skin care regimen initiated : 07/04/2021 Notes: Electronic Signature(s) Signed: 09/24/2021 5:49:01 PM By: Adline Peals Entered By: Adline Peals on 09/24/2021 10:37:48 -------------------------------------------------------------------------------- Pain Assessment Details Patient Name: Date of Service: [name], [name] 09/24/2021 10:45 A M Medical Record Number: [id number] Patient Account Number: [id number] Date of Birth/Sex: Treating RN: December 07, 1970 (51 y.o. [name] Primary Care Harish Bram: [name] Other Clinician: Referring Zarya Lasseigne: Treating [name]/Extender: [name] in Treatment: 11 Active Problems Location of Pain Severity and Description of Pain Patient Has Paino No Site Locations Rate the pain. Current Pain Level: 0 Pain Management and Medication Current Pain Management: Electronic Signature(s) Signed: 09/24/2021 5:49:01 PM By: Adline Peals Entered By: Adline Peals on 09/24/2021 10:28:32 -------------------------------------------------------------------------------- Patient/Caregiver Education Details Patient Name: Date of Service: [place], [name] 9/5/2023andnbsp10:45 A M Medical Record Number: [id number] Patient Account Number: [id number] Date of Birth/Gender: Treating RN: 1970/08/19 (51 y.o. [name] Primary Care Physician: [name] Other Clinician: Referring Physician: Treating Physician/Extender: [name] in Treatment: 11 Education Assessment Education Provided  To: Patient Education Topics Provided Wound/Skin Impairment: Methods: Explain/Verbal Responses: Reinforcements needed, State content correctly [employer]) Signed: 09/24/2021 5:49:01 PM By: Pollie Friar,  Lovena Le Entered By: Adline Peals on 09/24/2021 10:38:04 -------------------------------------------------------------------------------- Wound Assessment Details Patient Name: Date of Service: [name], [name] 09/24/2021 10:45 A M Medical Record Number: [id number] Patient Account Number: [id number] Date of Birth/Sex: Treating RN: 02/03/1970 (51 y.o. [name] Primary Care Archit Leger: [name] Other Clinician: Referring Isabella Roemmich: Treating Arrick Dutton/Extender: [name] in Treatment: 11 Wound Status Wound Number: 2 Primary Etiology: Diabetic Wound/Ulcer of the Lower Extremity Wound Location: Right, Lateral, Plantar Foot Wound Status: Open Wounding Event: Gradually Appeared Comorbid History: Type II Diabetes Date Acquired: 12/19/2016 Weeks Of Treatment: 11 Clustered Wound: No Photos Wound Measurements Length: (cm) 0.1 Width: (cm) 1.7 Depth: (cm) 0.6 Area: (cm) 0.134 Volume: (cm) 0.08 % Reduction in Area: 96.3% % Reduction in Volume: 99.6% Epithelialization: Small (1-33%) Tunneling: No Undermining: No Wound Description Classification: Grade 3 Wound Margin: Thickened Exudate Amount: Medium Exudate Type: Serosanguineous Exudate Color: red, brown Foul Odor After Cleansing: No Slough/Fibrino Yes Wound Bed Granulation Amount: Large (67-100%) Exposed Structure Granulation Quality: Pink Fascia Exposed: No Necrotic Amount: Small (1-33%) Fat Layer (Subcutaneous Tissue) Exposed: Yes Necrotic Quality: Eschar, Adherent Slough Tendon Exposed: No Muscle Exposed: No Joint Exposed: No Bone Exposed: No Treatment Notes Wound #2 (Foot) Wound Laterality: Plantar, Right, Lateral Cleanser Soap and Water Discharge  Instruction: May shower and wash wound with dial antibacterial soap and water prior to dressing change. Wound Cleanser Discharge Instruction: Cleanse the wound with wound cleanser prior to applying a clean dressing using gauze sponges, not tissue or cotton balls. Peri-Wound Care cotton tipped applicators Topical Gentamicin Discharge Instruction: As directed by physician Primary Dressing KerraCel Ag Gelling Fiber Dressing, 2x2 in (silver alginate) Discharge Instruction: Apply silver alginate to wound bed as instructed Secondary Dressing Secured With Paper Tape, 2x10 (in/yd) Discharge Instruction: Secure dressing with tape as directed. Compression Wrap Kerlix Roll 4.5x3.1 (in/yd) Discharge Instruction: Apply Kerlix and Coban compression as directed. Compression Stockings Add-Ons Electronic Signature(s) Signed: 09/24/2021 12:19:15 PM By: Blanche East RN Signed: 09/24/2021 5:49:01 PM By: Adline Peals Entered By: Blanche East on 09/24/2021 10:35:53 -------------------------------------------------------------------------------- Vitals Details Patient Name: Date of Service: [name], [name] 09/24/2021 10:45 A M Medical Record Number: [id number] Patient Account Number: [id number] Date of Birth/Sex: Treating RN: 07/01/1970 (51 y.o. [name] Primary Care Cevin Rubinstein: [name] Other Clinician: Referring Jakin Pavao: Treating Sherion Dooly/Extender: [name] in Treatment: 11 Vital Signs Time Taken: 10:08 Temperature (F): 97.2 Height (in): 72 Pulse (bpm): 72 Weight (lbs): 312 Respiratory Rate (breaths/min): 16 Body Mass Index (BMI): 42.3 Blood Pressure (mmHg): 146/97 Capillary Blood Glucose (mg/dl): 128 Reference Range: 80 - 120 mg / dl Electronic Signature(s) Signed: 09/24/2021 5:49:01 PM By: Adline Peals Entered By: Adline Peals on 09/24/2021 40:10:27

## 2021-09-24 NOTE — Progress Notes (Addendum)
[name], Najae M. ([id number]) Visit Report for 09/24/2021 Arrival Information Details Patient Name: Date of Service: [place], [place] [name] 09/24/2021 8:00 A M Medical Record Number: [id number] Patient Account Number: [id number] Date of Birth/Sex: Treating RN: [birth year] (51 y.o. [name], [name] Primary Care Jeni Duling: [name] Other Clinician: [name] Referring Zahava Quant: Treating Danikah Budzik/Extender: [name] in Treatment: 11 Visit Information History Since Last Visit All ordered tests and consults were completed: Yes Patient Arrived: Ambulatory Added or deleted any medications: No Arrival Time: 07:51 Any new allergies or adverse reactions: No Accompanied By: None Had a fall or experienced change in No Transfer Assistance: None activities of daily living that may affect Patient Identification Verified: Yes risk of falls: Secondary Verification Process Completed: Yes Signs or symptoms of abuse/neglect since last visito No Patient Requires Transmission-Based Precautions: No Hospitalized since last visit: No Patient Has Alerts: Yes Implantable device outside of the clinic excluding No cellular tissue based products placed in the center since last visit: Pain Present Now: No Electronic Signature(s) Signed: 09/24/2021 3:45:39 PM By: [name] EMT Entered By: [name] on 09/24/2021 15:45:39 -------------------------------------------------------------------------------- Encounter Discharge Information Details Patient Name: Date of Service: [place], [name] 09/24/2021 8:00 A M Medical Record Number: [id number] Patient Account Number: [id number] Date of Birth/Sex: Treating RN: 11-23-1970 (51 y.o. [name] Primary Care Luvern Mcisaac: [name] Other Clinician: [name] Referring Jadis Mika: Treating Dae Antonucci/Extender: [name] in Treatment: 11 Encounter Discharge Information Items Discharge  Condition: Stable Ambulatory Status: Ambulatory Discharge Destination: Home Transportation: Private Auto Accompanied By: None Schedule Follow-up Appointment: Yes Clinical Summary of Care: Electronic Signature(s) Signed: 09/24/2021 3:50:18 PM By: [name] EMT Entered By: [name] on 09/24/2021 15:50:18 -------------------------------------------------------------------------------- Vitals Details Patient Name: Date of Service: [name], [name] 09/24/2021 8:00 A M Medical Record Number: [id number] Patient Account Number: [id number] Date of Birth/Sex: Treating RN: 09-30-1970 (51 y.o. [name], [name] Primary Care Kristion Holifield: [name] Other Clinician: [name] Referring Nadira Single: Treating Marky Buresh/Extender: [name] in Treatment: 11 Vital Signs Time Taken: 08:11 Temperature (F): 97.5 Height (in): 72 Pulse (bpm): 89 Weight (lbs): 312 Respiratory Rate (breaths/min): 16 Body Mass Index (BMI): 42.3 Blood Pressure (mmHg): 125/79 Reference Range: 80 - 120 mg / dl Electronic Signature(s) Signed: 09/24/2021 3:46:12 PM By: [name] EMT Entered By: [name] on 09/24/2021 15:46:11

## 2021-09-24 NOTE — Progress Notes (Addendum)
[name], [name] ([id number]) Visit Report for 09/24/2021 HBO Details Patient Name: Date of Service: [name], [name] 09/24/2021 8:00 A M Medical Record Number: [id number] Patient Account Number: [id number] Date of Birth/Sex: Treating RN: 30-Sep-1970 [name], [name] Primary Care [name]: [name] Other Clinician: [name] Referring [name]: Treating [name]/Extender: [name] in Treatment: 11 HBO Treatment Course Details Treatment Course Number: 1 Ordering [name]: [name] T Treatments Ordered: otal 40 HBO Treatment Start Date: 08/14/2021 HBO Indication: Diabetic Ulcer(s) of the Lower Extremity Standard/Conservative Wound Care tried and failed greater than or equal to 30 days Wound #2 Right, Lateral, Plantar Foot , Wound #3R Right, Lateral Foot HBO Treatment Details Treatment Number: 29 Patient Type: Outpatient Chamber Type: Monoplace Chamber Serial #: [id number] Treatment Protocol: 2.0 ATA with 90 minutes oxygen, and no air breaks Treatment Details Compression Rate Down: 2.0 psi / minute De-Compression Rate Up: 2.0 psi / minute Air breaks and breathing Decompress Decompress Compress Tx Pressure Begins Reached periods Begins Ends (leave unused spaces blank) Chamber Pressure (ATA 1 2 ------2 1 ) Clock Time (24 hr) 08:14 08:24 - - - - - - 09:55 10:04 Treatment Length: 110 (minutes) Treatment Segments: 4 Vital Signs Capillary Blood Glucose Reference Range: 80 - 120 mg / dl HBO Diabetic Blood Glucose Intervention Range: <131 mg/dl or >249 mg/dl Time Vitals Blood Respiratory Capillary Blood Glucose Pulse Action Type: Pulse: Temperature: Taken: Pressure: Rate: Glucose (mg/dl): Meter #: Oximetry (%) Taken: Pre 08:11 125/79 89 16 97.5 143 Post 10:08 146/97 72 16 97.2 128 Treatment Response Treatment Toleration: Well Treatment Completion Status: Treatment Completed without Adverse Event Additional Procedure  Documentation Tissue Sevierity: Necrosis of bone Physician HBO Attestation: I certify that I supervised this HBO treatment in accordance with Medicare guidelines. A trained emergency response team is readily available per Yes hospital policies and procedures. Continue HBOT as ordered. Yes Electronic Signature(s) Signed: 09/24/2021 4:32:10 PM By: [name] DO Previous Signature: 09/24/2021 3:49:11 PM Version By: [name] EMT Entered By: [name] on 09/24/2021 16:30:40 -------------------------------------------------------------------------------- HBO Safety Checklist Details Patient Name: Date of Service: [name], [name] 09/24/2021 8:00 A M Medical Record Number: [id number] Patient Account Number: [id number] Date of Birth/Sex: Treating RN: April 16, 1970 [name], [name] Primary Care Taaliyah Delpriore: [name] Other Clinician: [name] Referring [name]: Treating [name]/Extender: [name] in Treatment: 11 HBO Safety Checklist Items Safety Checklist Consent Form Signed Patient voided / foley secured and emptied When did you last eato 0705 Last dose of injectable or oral agent 0650 Ostomy pouch emptied and vented if applicable NA All implantable devices assessed, documented and approved NA Intravenous access site secured and place NA PICC line removed Valuables secured Linens and cotton and cotton/polyester blend (less than 51% polyester) Personal oil-based products / skin lotions / body lotions removed Wigs or hairpieces removed NA Smoking or tobacco materials removed Books / newspapers / magazines / loose paper removed Cologne, aftershave, perfume and deodorant removed Jewelry removed (may wrap wedding band) NA Make-up removed NA Hair care products removed Battery operated devices (external) removed Heating patches and chemical warmers removed Titanium eyewear removed NA Nail polish cured greater than 10  hours NA Casting material cured greater than 10 hours NA Hearing aids removed NA Loose dentures or partials removed NA Prosthetics have been removed NA Patient demonstrates correct use of air break device (if applicable) Patient concerns have been addressed Patient grounding bracelet on and cord attached to  chamber Specifics for Inpatients (complete in addition to above) Medication sheet sent with patient NA Intravenous medications needed or due during therapy sent with patient NA Drainage tubes ([name].g. nasogastric tube or chest tube secured and vented) NA Endotracheal or Tracheotomy tube secured NA Cuff deflated of air and inflated with saline NA Airway suctioned NA Notes The safety checklist was done before the treatment was started Electronic Signature(s) Signed: 09/24/2021 3:47:51 PM By: [name] EMT Entered By: [name] on 09/24/2021 15:47:50

## 2021-09-24 NOTE — Progress Notes (Signed)
[name], Jeanpierre M. ([id number]) Visit Report for 09/24/2021 Chief Complaint Document Details Patient Name: Date of Service: [place], [place] [name] 09/24/2021 10:45 A M Medical Record Number: [id number] Patient Account Number: [id number] Date of Birth/Sex: Treating RN: [birth year] (51 y.o. M) Primary Care Provider: [name] Other Clinician: Referring Provider: Treating Provider/Extender: [name] in Treatment: 11 Information Obtained from: Patient Chief Complaint Patients presents for treatment of an open diabetic ulcer Electronic Signature(s) Signed: 09/24/2021 10:59:53 AM By: [name] MD FACS Entered By: [name] on 09/24/2021 10:59:52 -------------------------------------------------------------------------------- Debridement Details Patient Name: Date of Service: [name], [name] 09/24/2021 10:45 A M Medical Record Number: [id number] Patient Account Number: [id number] Date of Birth/Sex: Treating RN: 11-06-1970 (51 y.o. [name] Primary Care Provider: [name] Other Clinician: Referring Provider: Treating Provider/Extender: [name] in Treatment: 11 Debridement Performed for Assessment: Wound #2 Right,Lateral,Plantar Foot Performed By: Physician [name], MD Debridement Type: Debridement Severity of Tissue Pre Debridement: Fat layer exposed Level of Consciousness (Pre-procedure): Awake and Alert Pre-procedure Verification/Time Out Yes - 10:40 Taken: Start Time: 10:40 Pain Control: Lidocaine 5% topical ointment T Area Debrided (L x W): otal 0.1 (cm) x 1.7 (cm) = 0.17 (cm) Tissue and other material debrided: Non-Viable, Callus, Subcutaneous Level: Skin/Subcutaneous Tissue Debridement Description: Excisional Instrument: Curette Bleeding: Minimum Hemostasis Achieved: Pressure Procedural Pain: 0 Post Procedural Pain: 0 Response to Treatment: Procedure was tolerated well Level  of Consciousness (Post- Awake and Alert procedure): Post Debridement Measurements of Total Wound Length: (cm) 0.1 Width: (cm) 1.7 Depth: (cm) 0.6 Volume: (cm) 0.08 Character of Wound/Ulcer Post Debridement: Improved Severity of Tissue Post Debridement: Fat layer exposed Post Procedure Diagnosis Same as Pre-procedure Electronic Signature(s) Signed: 09/24/2021 12:22:50 PM By: [name] MD FACS Signed: 09/24/2021 5:49:01 PM By: Adline Peals Entered By: Adline Peals on 09/24/2021 10:42:27 -------------------------------------------------------------------------------- HPI Details Patient Name: Date of Service: [name], [name] 09/24/2021 10:45 A M Medical Record Number: [id number] Patient Account Number: [id number] Date of Birth/Sex: Treating RN: 1970-12-10 (51 y.o. 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 The most recent amputation was in August 2022. At that operation, antibiotic beads were placed in the wound. He has been managed by podiatry for his procedures and management of his wounds. He has been in a Water engineer. He is on oral doxycycline. They have been using Betadine wet to dry dressings along with Iodosorb. The patient states that when he thinks the wound is getting too dry, he applies topical Neosporin. At his last visit, on June 7 of this year, the podiatrist determined that he felt the wound was stalled and referred him to wound care for additional evaluation and management. An MRI has been ordered, but not yet scheduled or performed. Pathology from his operation in August 2022 demonstrated  findings consistent with chronic osteomyelitis. Today, there is a large irregular wound on the plantar surface of his right foot, at about the level of the fifth metatarsal head. This tracks through to a pinpoint opening on the dorsal lateral portion of his foot. The intake nurse reported purulent drainage. There is some malodor from the wound. No frank necrosis identified. 07/11/2021: Today,  the wounds do not connect. I attempted multiple times from various directions and the shared tunnel is no longer open. He has some slough accumulation on the dorsal part of his foot as well as slough and callus buildup on the plantar surface. His MRI is scheduled for June 29. No purulent drainage or malodor appreciated today. 07/19/2021: The lateral foot wound has closed and there is no tunnel connecting the plantar foot wound to that site. The plantar foot wound still probes quite deeply, however. There is some slough, eschar, and nonviable tissue accumulated in the wound bed. No malodorous drainage present. His MRI was performed yesterday and is consistent with fairly extensive osteomyelitis. 07/29/2021: The patient has an appointment with infectious disease on July 18 to treat his osteomyelitis. The plantar wound still probes quite deeply, approaching bone. The orifice has narrowed quite substantially, however, making it more difficult for him to pack. 08/05/2021: The tunnel connecting the lateral foot wound to the plantar foot wound has reopened. He sees infectious disease tomorrow to discuss long-term antibiotic treatment for osteomyelitis. There was a bit of murky drainage in the wound, but this was noted after he had had topical lidocaine applied so may have just been a blob of the anesthetic. No odor or frank purulent drainage. The wound probes deeply at the midfoot approaching bone. He does have MRI results confirming his diagnosis of osteomyelitis. He has failed to progress with conventional treatment and  I think his best chance for preservation of the foot is to initiate hyperbaric oxygen therapy. 08/13/2021: The tunnel connecting the 2 wounds has closed again. He has a PICC line and is getting IV daptomycin and cefepime. EKG and chest x-ray are within normal limits. He is scheduled to start hyperbaric oxygen therapy tomorrow. The wound in his midfoot probes deeply, approaching bone. The skin at the orifice continues to heaped up and threatens to close over despite the large cavity within. No erythema, induration, or purulent drainage. The wound on his lateral foot is fairly small and quite clean. 08/19/2021: The lateral foot wound has nearly closed. The wound in his midfoot does not probe quite as deeply today. The skin at the orifice continues to try to roll in and obscure the opening. No frankly necrotic tissue appreciated. He is tolerating hyperbaric oxygen therapy. 08/26/2021: The lateral foot wound has closed completely. The wound in his midfoot is shallower again today. The wound orifice is contracting. We are using gentamicin and silver alginate. He continues on IV daptomycin and cefepime and is tolerating hyperbaric oxygen therapy without difficulty. 09/02/2021: His foot is a little bit sore today but he was up walking on it all weekend doing back to school shopping with his daughter. The tunneling is down to about 3 cm and I do not appreciate bone at the tip of the probe. The wound is clean but has some callus creating an overhanging lip at the distal aspect. He continues to receive hyperbaric oxygen therapy as well as IV daptomycin and cefepime. 09/09/2021: His wound continues to improve. The tunnel has come in by 0.9 cm. He continues to form callus around the orifice of the wound. He is tolerating hyperbaric oxygen therapy along with his IV antibiotics. 09/16/2021: The tunneling is down to just half a centimeter. He continues to build up callus around the orifice of the wound. He completed his  course of IV antibiotics and his PICC line is scheduled to be removed this Friday. He is tolerating hyperbaric oxygen without difficulty. We  have been applying topical gentamicin and silver alginate to his wound. 09/24/2021: The wound depth has come in even further. There is a little bit of callus buildup around the orifice. The opening is quite narrow, at this point. Electronic Signature(s) Signed: 09/24/2021 11:01:44 AM By: [name] MD FACS Entered By: [name] on 09/24/2021 11:01:44 -------------------------------------------------------------------------------- Physical Exam Details Patient Name: Date of Service: [name], [name] 09/24/2021 10:45 A M Medical Record Number: [id number] Patient Account Number: [id number] Date of Birth/Sex: Treating RN: [birth year] (51 y.o. M) Primary Care Provider: [name] Other Clinician: Referring Provider: Treating Provider/Extender: [name] in Treatment: 11 Constitutional Slightly hypertensive. . . . No acute distress.Marland Kitchen Respiratory Normal work of breathing on room air.. Notes 09/24/2021: The wound depth has come in even further. There is a little bit of callus buildup around the orifice. The opening is quite narrow, at this point. Electronic Signature(s) Signed: 09/24/2021 11:02:27 AM By: [name] MD FACS Entered By: [name] on 09/24/2021 11:02:27 -------------------------------------------------------------------------------- Physician Orders Details Patient Name: Date of Service: [name], [name] 09/24/2021 10:45 A M Medical Record Number: [id number] Patient Account Number: [id number] Date of Birth/Sex: Treating RN: 12-06-1970 (51 y.o. 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 - Dr Celine Ahr Room 2 - 9/11 at 7:45 AM Return A Anesthetic Wound #2 [place] (In clinic) Topical Lidocaine 5% applied to wound bed - In Clinic Bathing/ Shower/ Hygiene Other Bathing/Shower/Hygiene Orders/Instructions: - Change dressing after bathing Off-Loading Other: - Try to not stand on your feet too much Hyperbaric Oxygen Therapy Evaluate for HBO Therapy Indication: - wagner grade 3 diabetic foot ulcer right foot 2.0 ATA for 90 Minutes without A Breaks ir Total Number of Treatments: - 40 One treatments per day (delivered Monday through Friday unless otherwise specified in Special Instructions below): Finger stick Blood Glucose Pre- and Post- HBOT Treatment. Follow Hyperbaric Oxygen Glycemia Protocol Afrin (Oxymetazoline HCL) 0.05% nasal spray - 1 spray in both nostrils daily as needed prior to HBO treatment for difficulty clearing ears Wound Treatment Wound #2 - Foot Wound Laterality: Plantar, Right, Lateral Cleanser: Soap and Water Every Other Day/15 Days Discharge Instructions: May shower and wash wound with dial antibacterial soap and water prior to dressing change. Cleanser: Wound Cleanser (Generic) Every Other Day/15 Days Discharge Instructions: Cleanse the wound with wound cleanser prior to applying a clean dressing using gauze sponges, not tissue or cotton balls. Peri-Wound Care: cotton tipped applicators (Generic) Every Other Day/15 Days Topical: Gentamicin Every Other Day/15 Days Discharge Instructions: As directed by physician Prim Dressing: KerraCel Ag Gelling Fiber Dressing, 2x2 in (silver alginate) Every Other Day/15 Days ary Discharge Instructions: Apply silver alginate to  wound bed as instructed Secured With: Paper Tape, 2x10 (in/yd) (Generic) Every Other Day/15 Days Discharge Instructions: Secure dressing with tape as directed. Compression Wrap: Kerlix Roll 4.5x3.1 (in/yd) (Generic) Every Other Day/15 Days Discharge Instructions: Apply Kerlix and Coban compression as directed. Patient Medications llergies: No Known Allergies A Notifications Medication Indication Start End 09/24/2021 lidocaine DOSE topical 5 % ointment - ointment topical GLYCEMIA INTERVENTIONS PROTOCOL PRE-HBO GLYCEMIA INTERVENTIONS ACTION  INTERVENTION Obtain pre-HBO capillary blood glucose (ensure 1 physician order is in chart). A. Notify HBO physician and await physician orders. 2 If result is 70 mg/dl or below: B. If the result meets the hospital definition of a critical result, follow hospital policy. A. Give patient an 8 ounce Glucerna Shake, an 8 ounce Ensure, or 8 ounces of a Glucerna/Ensure equivalent dietary supplement*. B. Wait 30 minutes. If result is 71 mg/dl to 130 mg/dl: C. Retest patients capillary blood glucose (CBG). D. If result greater than or equal to 110 mg/dl, proceed with HBO. If result less than 110 mg/dl, notify HBO physician and consider holding HBO. If result is 131 mg/dl to 249 mg/dl: A. Proceed with HBO. A. Notify HBO physician and await physician orders. B. It is recommended to hold HBO and do If result is 250 mg/dl or greater: blood/urine ketone testing. C. If the result meets the hospital definition of a critical result, follow hospital policy. POST-HBO GLYCEMIA INTERVENTIONS ACTION INTERVENTION Obtain post HBO capillary blood glucose (ensure 1 physician order is in chart). A. Notify HBO physician and await physician orders. 2 If result is 70 mg/dl or below: B. If the result meets the hospital definition of a critical result, follow hospital policy. A. Give patient an 8 ounce Glucerna Shake, an 8 ounce Ensure, or 8 ounces of  a Glucerna/Ensure equivalent dietary supplement*. B. Wait 15 minutes for symptoms of If result is 71 mg/dl to 100 mg/dl: hypoglycemia (i.e. nervousness, anxiety, sweating, chills, clamminess, irritability, confusion, tachycardia or dizziness). C. If patient asymptomatic, discharge patient. If patient symptomatic, repeat capillary blood glucose (CBG) and notify HBO physician. If result is 101 mg/dl to 249 mg/dl: A. Discharge patient. A. Notify HBO physician and await physician orders. B. It is recommended to do blood/urine ketone If result is 250 mg/dl or greater: testing. C. If the result meets the hospital definition of a critical result, follow hospital policy. *Juice or candies are NOT equivalent products. If patient refuses the Glucerna or Ensure, please consult the hospital dietitian for an appropriate substitute. Electronic Signature(s) Signed: 09/24/2021 12:22:50 PM By: [name] MD FACS Entered By: [name] on 09/24/2021 11:04:14 -------------------------------------------------------------------------------- Problem List Details Patient Name: Date of Service: [name], [name] 09/24/2021 10:45 A M Medical Record Number: [id number] Patient Account Number: [id number] Date of Birth/Sex: Treating RN: 1970/03/25 (51 y.o. 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 09/24/2021 10:45 A M Medical Record Number: [id number] Patient Account Number: [id number] Date of Birth/Sex: Treating RN: [birth year] (51 y.o. M) Primary Care Provider: Other Clinician: [name] Referring Provider: Treating Provider/Extender: [name] in Treatment: 11 Subjective Chief Complaint Information obtained from Patient Patients presents for treatment of an open diabetic ulcer History of Present Illness (HPI) ADMISSION 07/04/2021 This is a 51 year old type II diabetic (last hemoglobin A1c 6.8%) who has had a number of diabetic foot infections, resulting in the amputation  of right toes 3 through 5. The most recent amputation was in August 2022. At that operation, antibiotic beads were placed in the wound. He has been managed by podiatry for his procedures and management of his wounds. He has been in a Water engineer. He is on oral doxycycline. They have been using Betadine wet to dry dressings along with Iodosorb. The patient states that when he thinks the wound is getting too dry, he applies topical Neosporin. At his last visit, on June 7 of this year, the podiatrist determined that he felt the wound was stalled and referred him to wound care for additional evaluation and management. An MRI has been ordered, but not yet scheduled or performed. Pathology from his operation in August 2022 demonstrated findings consistent with chronic osteomyelitis. Today, there is a large irregular wound on the plantar surface of his right foot, at about the level of the fifth metatarsal head. This tracks through to a pinpoint opening on the dorsal lateral portion of his foot. The intake nurse reported purulent drainage. There is some malodor from the wound. No  frank necrosis identified. 07/11/2021: Today, the wounds do not connect. I attempted multiple times from various directions and the shared tunnel is no longer open. He has some slough accumulation on the dorsal part of his foot as well as slough and callus buildup on the plantar surface. His MRI is scheduled for June 29. No purulent drainage or malodor appreciated today. 07/19/2021: The lateral foot wound has closed and there is no tunnel connecting the plantar foot wound to that site. The plantar foot wound still probes quite deeply, however. There is some slough, eschar, and nonviable tissue accumulated in the wound bed. No malodorous drainage present. His MRI was performed yesterday and is consistent with fairly extensive osteomyelitis. 07/29/2021: The patient has an appointment with infectious disease on July 18 to treat his osteomyelitis. The plantar wound still probes quite deeply, approaching bone. The orifice has narrowed quite substantially, however, making it more difficult for him to pack. 08/05/2021: The tunnel connecting the lateral foot wound to the plantar foot wound has reopened. He sees infectious disease tomorrow to discuss long-term antibiotic treatment for osteomyelitis. There was a bit of murky drainage in the wound, but this was noted after he had had topical lidocaine applied so may have just been a blob of the anesthetic. No odor or frank purulent drainage. The wound probes deeply at the midfoot approaching bone. He does have MRI results confirming his diagnosis of osteomyelitis. He has failed to progress with conventional treatment and I think his best chance for preservation of the foot is to initiate hyperbaric oxygen therapy. 08/13/2021: The tunnel connecting the 2 wounds has closed again. He has a PICC line and is getting IV daptomycin and cefepime. EKG and chest x-ray are within normal limits. He is scheduled to start hyperbaric oxygen therapy tomorrow. The wound in his  midfoot probes deeply, approaching bone. The skin at the orifice continues to heaped up and threatens to close over despite the large cavity within. No erythema, induration, or purulent drainage. The wound on his lateral foot is fairly small and quite clean. 08/19/2021: The lateral foot wound has nearly closed. The wound in his midfoot does not probe quite as deeply today. The skin at the orifice continues to try to roll in and obscure the opening. No frankly necrotic tissue appreciated. He is tolerating hyperbaric oxygen therapy. 08/26/2021: The lateral foot wound has closed completely. The wound  in his midfoot is shallower again today. The wound orifice is contracting. We are using gentamicin and silver alginate. He continues on IV daptomycin and cefepime and is tolerating hyperbaric oxygen therapy without difficulty. 09/02/2021: His foot is a little bit sore today but he was up walking on it all weekend doing back to school shopping with his daughter. The tunneling is down to about 3 cm and I do not appreciate bone at the tip of the probe. The wound is clean but has some callus creating an overhanging lip at the distal aspect. He continues to receive hyperbaric oxygen therapy as well as IV daptomycin and cefepime. 09/09/2021: His wound continues to improve. The tunnel has come in by 0.9 cm. He continues to form callus around the orifice of the wound. He is tolerating hyperbaric oxygen therapy along with his IV antibiotics. 09/16/2021: The tunneling is down to just half a centimeter. He continues to build up callus around the orifice of the wound. He completed his course of IV antibiotics and his PICC line is scheduled to be removed this Friday. He is tolerating hyperbaric oxygen without difficulty. We have been applying topical gentamicin and silver alginate to his wound. 09/24/2021: The wound depth has come in even further. There is a little bit of callus buildup around the orifice. The opening is quite  narrow, at this point. Patient History Information obtained from Patient. Social History Never smoker, Marital Status - Married, Alcohol Use - Never, Drug Use - No History, Caffeine Use - Daily - T coffee. ea; Medical History Endocrine Patient has history of Type II Diabetes Hospitalization/Surgery History - Amuptation of 3rd,4th and 5th toes of Right foot;Oral Surgery;Anal Fissure surgery; Cholecystectomy. Objective Constitutional Slightly hypertensive. No acute distress.. Vitals Time Taken: 10:08 AM, Height: 72 in, Weight: 312 lbs, BMI: 42.3, Temperature: 97.2 F, Pulse: 72 bpm, Respiratory Rate: 16 breaths/min, Blood Pressure: 146/97 mmHg, Capillary Blood Glucose: 128 mg/dl. Respiratory Normal work of breathing on room air.. General Notes: 09/24/2021: The wound depth has come in even further. There is a little bit of callus buildup around the orifice. The opening is quite narrow, at this point. Integumentary (Hair, Skin) Wound #2 status is Open. Original cause of wound was Gradually Appeared. The date acquired was: 12/19/2016. The wound has been in treatment 11 weeks. The wound is located on the [place]. The wound measures 0.1cm length x 1.7cm width x 0.6cm depth; 0.134cm^2 area and 0.08cm^3 volume. There is Fat Layer (Subcutaneous Tissue) exposed. There is no tunneling or undermining noted. There is a medium amount of serosanguineous drainage noted. The wound margin is thickened. There is large (67-100%) pink granulation within the wound bed. There is a small (1-33%) amount of necrotic tissue within the wound bed including Eschar and Adherent Slough. Assessment Active Problems ICD-10 Type 2 diabetes mellitus with other diabetic neurological complication Other chronic osteomyelitis, right ankle and foot Non-pressure chronic ulcer of other part of right foot with necrosis of bone Procedures Wound #2 Pre-procedure diagnosis of Wound #2 is a Diabetic Wound/Ulcer of  the Lower Extremity located on the Right,Lateral,Plantar Foot .Severity of Tissue Pre Debridement is: Fat layer exposed. There was a Excisional Skin/Subcutaneous Tissue Debridement with a total area of 0.17 sq cm performed by [name], MD. With the following instrument(s): Curette to remove Non-Viable tissue/material. Material removed includes Callus and Subcutaneous Tissue and after achieving pain control using Lidocaine 5% topical ointment. No specimens were taken. A time out was conducted at 10:40, prior to the  start of the procedure. A Minimum amount of bleeding was controlled with Pressure. The procedure was tolerated well with a pain level of 0 throughout and a pain level of 0 following the procedure. Post Debridement Measurements: 0.1cm length x 1.7cm width x 0.6cm depth; 0.08cm^3 volume. Character of Wound/Ulcer Post Debridement is improved. Severity of Tissue Post Debridement is: Fat layer exposed. Post procedure Diagnosis Wound #2: Same as Pre-Procedure Plan Follow-up Appointments: Return Appointment in 1 week. 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 Follow Hyperbaric Oxygen Glycemia Protocol Afrin (Oxymetazoline HCL) 0.05% nasal spray - 1 spray in both nostrils daily as needed prior to HBO treatment for difficulty clearing ears The  following medication(s) was prescribed: lidocaine topical 5 % ointment ointment topical was prescribed at facility WOUND #2: - Foot Wound Laterality: Plantar, Right, Lateral Cleanser: Soap and Water Every Other Day/15 Days Discharge Instructions: May shower and wash wound with dial antibacterial soap and water prior to dressing change. Cleanser: Wound Cleanser (Generic) Every Other Day/15 Days Discharge Instructions: Cleanse the wound with wound cleanser prior to applying a clean dressing using gauze sponges, not tissue or cotton balls. Peri-Wound Care: cotton tipped applicators (Generic) Every Other Day/15 Days Topical: Gentamicin Every Other Day/15 Days Discharge Instructions: As directed by physician Prim Dressing: KerraCel Ag Gelling Fiber Dressing, 2x2 in (silver alginate) Every Other Day/15 Days ary Discharge Instructions: Apply silver alginate to wound bed as instructed Secured With: Paper T ape, 2x10 (in/yd) (Generic) Every Other Day/15 Days Discharge Instructions: Secure dressing with tape as directed. Com pression Wrap: Kerlix Roll 4.5x3.1 (in/yd) (Generic) Every Other Day/15 Days Discharge Instructions: Apply Kerlix and Coban compression as directed. 09/24/2021: The wound depth has come in even further. There is a little bit of callus buildup around the orifice. The opening is quite narrow, at this point. I used a curette to debride callus and subcu tissue from the wound. We will continue to pack it with gentamicin and silver alginate. He will continue his hyperbaric oxygen therapy. Follow-up in 1 week. Electronic Signature(s) Signed: 09/24/2021 11:04:46 AM By: [name] MD FACS Entered By: [name] on 09/24/2021 11:04:46 -------------------------------------------------------------------------------- HxROS Details Patient Name: Date of Service: [name], [name] 09/24/2021 10:45 A M Medical Record Number: [id number] Patient Account Number: [id number] Date of Birth/Sex:  Treating RN: [birth year] (51 y.o. M) Primary Care Provider: [name] Other Clinician: Referring Provider: Treating Provider/Extender: [name] in Treatment: 11 Information Obtained From Patient Endocrine Medical History: Positive for: Type II Diabetes Immunizations Pneumococcal Vaccine: Received Pneumococcal Vaccination: Yes Received Pneumococcal Vaccination On or After 60th Birthday: No Implantable Devices None Hospitalization / Surgery History Type of Hospitalization/Surgery Amuptation of 3rd,4th and 5th toes of Right foot;Oral Surgery;Anal Fissure surgery; Cholecystectomy Family and Social History Never smoker; Marital Status - Married; Alcohol Use: Never; Drug Use: No History; Caffeine Use: Daily - T coffee; Financial Concerns: No; Food, ea; Clothing or Shelter  Needs: No; Support System Lacking: No; Transportation Concerns: No Electronic Signature(s) Signed: 09/24/2021 12:22:50 PM By: [name] MD FACS Entered By: [name] on 09/24/2021 11:01:49 -------------------------------------------------------------------------------- SuperBill Details Patient Name: Date of Service: [name], [name] 09/24/2021 Medical Record Number: [id number] Patient Account Number: [id number] Date of Birth/Sex: Treating RN: 1971-01-07 (51 y.o. 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 A21.308 Other chronic osteomyelitis, right ankle and foot L97.514 Non-pressure chronic ulcer of other part of right foot with necrosis of bone Facility Procedures CPT4 Code: 65784696 Description: 29528 - DEB SUBQ TISSUE 20 SQ CM/< ICD-10 Diagnosis Description L97.514 Non-pressure chronic ulcer of other part of right  foot with necrosis of bone Modifier: Quantity: 1 Physician Procedures : CPT4 Code Description Modifier [id number] 10272 - WC PHYS LEVEL 4 - EST PT 25 ICD-10 Diagnosis Description L97.514 Non-pressure chronic ulcer of other part of right foot with necrosis of bone M86.671 Other chronic osteomyelitis, right ankle and foot  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

## 2021-09-24 NOTE — Progress Notes (Signed)
[name], [name] ([id number]) Visit Report for 09/24/2021 Problem List Details Patient Name: Date of Service: [name], [name] 09/24/2021 8:00 A M Medical Record Number: [id number] Patient Account Number: [id number] Date of Birth/Sex: Treating RN: Sep 05, 1970 (51 y.o. 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 09/24/2021 Medical Record Number: [id number] Patient Account Number: [id number] Date of Birth/Sex: Treating RN: Apr 16, 1970 (51 y.o. 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 Y64.158 Other chronic osteomyelitis, right ankle and foot L97.514 Non-pressure chronic ulcer of other part of right foot with necrosis of bone Facility Procedures CPT4 Code: 30940768 Description: G0277-(Facility Use Only) HBOT full body chamber, 9mn , ICD-10 Diagnosis Description E11.49  Type 2 diabetes mellitus with other diabetic neurological complication LG88.110Non-pressure chronic ulcer of other part of right foot with necrosis  of bone M86.671 Other chronic osteomyelitis, right ankle and foot Modifier: Quantity: 4 Physician Procedures : CPT4 Code Description Modifier [id number] 59292- WC PHYS HYPERBARIC OXYGEN THERAPY ICD-10 Diagnosis Description 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

## 2021-09-24 NOTE — Progress Notes (Signed)
[name], [name] ([id number]) Visit Report for 09/20/2021 Problem List Details Patient Name: Date of Service: [name], [name] 09/20/2021 8:00 A M Medical Record Number: [id number] Patient Account Number: [id number] Date of Birth/Sex: Treating RN: [birth year] (51 y.o. 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 09/20/2021 Medical Record Number: [id number] Patient Account Number: [id number] Date of Birth/Sex: Treating RN: March 31, 1970 (51 y.o. 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 LY92.446Non-pressure chronic ulcer of other part of right foot with necrosis  of bone M86.671 Other chronic osteomyelitis, right ankle and foot Modifier: Quantity: 4 Physician Procedures : CPT4 Code Description Modifier [id number] 71165- WC PHYS HYPERBARIC OXYGEN THERAPY ICD-10 Diagnosis Description 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

## 2021-09-25 ENCOUNTER — Encounter (HOSPITAL_BASED_OUTPATIENT_CLINIC_OR_DEPARTMENT_OTHER): Payer: BLUE CROSS/BLUE SHIELD | Admitting: General Surgery

## 2021-09-25 DIAGNOSIS — E1169 Type 2 diabetes mellitus with other specified complication: Secondary | ICD-10-CM | POA: Diagnosis not present

## 2021-09-25 LAB — GLUCOSE, CAPILLARY
Glucose-Capillary: 153 mg/dL — ABNORMAL HIGH (ref 70–99)
Glucose-Capillary: 160 mg/dL — ABNORMAL HIGH (ref 70–99)

## 2021-09-25 NOTE — Progress Notes (Signed)
[name], [name] ([id number]) Visit Report for 09/25/2021 Problem List Details Patient Name: Date of Service: [name], [name] 09/25/2021 8:00 A M Medical Record Number: [id number] Patient Account Number: [id number] Date of Birth/Sex: Treating RN: Sep 02, 1970 (51 y.o. 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 09/25/2021 Medical Record Number: [id number] Patient Account Number: [id number] Date of Birth/Sex: Treating RN: 03/11/1970 (51 y.o. 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 MP32.951Other chronic osteomyelitis, right ankle and foot L97.514  Non-pressure chronic ulcer of other part of right foot with necrosis of bone Modifier: Quantity: 4 Physician Procedures : CPT4 Code Description Modifier [id number] 63016- WC PHYS HYPERBARIC OXYGEN THERAPY ICD-10 Diagnosis Description 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

## 2021-09-25 NOTE — Progress Notes (Addendum)
[name], Izaha M. ([id number]) Visit Report for 09/25/2021 HBO Details Patient Name: Date of Service: [name], [name] 09/25/2021 8:00 A M Medical Record Number: [id number] Patient Account Number: [id number] Date of Birth/Sex: Treating RN: 08-02-1970 (51 y.o. [name], [name] Primary Care Robertson Colclough: [name] Other Clinician: [name] Referring Huxley Shurley: Treating Finneas Mathe/Extender: [name] in Treatment: 11 HBO Treatment Course Details Treatment Course Number: 1 Ordering Sharnette Kitamura: [name] T Treatments Ordered: otal 40 HBO Treatment Start Date: 08/14/2021 HBO Indication: Diabetic Ulcer(s) of the Lower Extremity Standard/Conservative Wound Care tried and failed greater than or equal to 30 days Wound #2 Right, Lateral, Plantar Foot , Wound #3R Right, Lateral Foot HBO Treatment Details Treatment Number: 30 Patient Type: Outpatient Chamber Type: Monoplace Chamber Serial #: [id number] Treatment Protocol: 2.0 ATA with 90 minutes oxygen, and no air breaks Treatment Details Compression Rate Down: 2.0 psi / minute De-Compression Rate Up: 2.0 psi / minute Air breaks and breathing Decompress Decompress Compress Tx Pressure Begins Reached periods Begins Ends (leave unused spaces blank) Chamber Pressure (ATA 1 2 ------2 1 ) Clock Time (24 hr) 08:15 08:23 - - - - - - 09:53 10:01 Treatment Length: 106 (minutes) Treatment Segments: 4 Vital Signs Capillary Blood Glucose Reference Range: 80 - 120 mg / dl HBO Diabetic Blood Glucose Intervention Range: <131 mg/dl or >249 mg/dl Time Vitals Blood Respiratory Capillary Blood Glucose Pulse Action Type: Pulse: Temperature: Taken: Pressure: Rate: Glucose (mg/dl): Meter #: Oximetry (%) Taken: Pre 08:10 139/73 93 16 97.9 153 Post 10:04 153/93 68 16 97.9 160 Treatment Response Treatment Toleration: Well Treatment Completion Status: Treatment Completed without Adverse Event Additional  Procedure Documentation Tissue Sevierity: Necrosis of bone Physician HBO Attestation: I certify that I supervised this HBO treatment in accordance with Medicare guidelines. A trained emergency response team is readily available per Yes hospital policies and procedures. Continue HBOT as ordered. Yes Electronic Signature(s) Signed: 09/25/2021 4:48:58 PM By: [name] MD FACS Previous Signature: 09/25/2021 4:23:28 PM Version By: [name] EMT Entered By: [name] on 09/25/2021 16:48:58 -------------------------------------------------------------------------------- HBO Safety Checklist Details Patient Name: Date of Service: [name], [name] 09/25/2021 8:00 A M Medical Record Number: [id number] Patient Account Number: [id number] Date of Birth/Sex: Treating RN: [birth year] (51 y.o. [name], [name] Primary Care Amritha Yorke: [name] Other Clinician: [name] Referring Kala Ambriz: Treating Ichelle Harral/Extender: [name] in Treatment: 11 HBO Safety Checklist Items Safety Checklist Consent Form Signed Patient voided / foley secured and emptied When did you last eato 0640 Last dose of injectable or oral agent 0655 Ostomy pouch emptied and vented if applicable NA All implantable devices assessed, documented and approved NA Intravenous access site secured and place NA Valuables secured Linens and cotton and cotton/polyester blend (less than 51% polyester) Personal oil-based products / skin lotions / body lotions removed Wigs or hairpieces removed NA Smoking or tobacco materials removed Books / newspapers / magazines / loose paper removed Cologne, aftershave, perfume and deodorant removed Jewelry removed (may wrap wedding band) NA Make-up removed NA Hair care products removed Battery operated devices (external) removed Heating patches and chemical warmers removed Titanium eyewear removed NA Nail polish cured greater than  10 hours NA Casting material cured greater than 10 hours NA Hearing aids removed NA Loose dentures or partials removed NA Prosthetics have been removed NA Patient demonstrates correct use of air break device (if applicable) Patient concerns have been addressed Patient grounding bracelet on and cord attached to chamber Specifics  for Inpatients (complete in addition to above) Medication sheet sent with patient NA Intravenous medications needed or due during therapy sent with patient NA Drainage tubes (e.g. nasogastric tube or chest tube secured and vented) NA Endotracheal or Tracheotomy tube secured NA Cuff deflated of air and inflated with saline NA Airway suctioned NA Notes The safety checklist was done before the treatment was started Electronic Signature(s) Signed: 09/25/2021 4:21:02 PM By: [name] EMT Entered By: [name] on 09/25/2021 16:21:02

## 2021-09-25 NOTE — Progress Notes (Signed)
[name], [name] ([id number]) Visit Report for 09/25/2021 Arrival Information Details Patient Name: Date of Service: ,  [name] 09/25/2021 8:00 A M Medical Record Number: [id number] Patient Account Number: [id number] Date of Birth/Sex: Treating RN: [birth year] [name], [name] Primary Care Cambell Rickenbach: [name] Other Clinician: Valeria [name] Referring Shine Scrogham: Treating Ajanee Buren/Extender: [name] in Treatment: 11 Visit Information History Since Last Visit All ordered tests and consults were completed: Yes Patient Arrived: Ambulatory Added or deleted any medications: No Arrival Time: 07:48 Any new allergies or adverse reactions: No Accompanied By: None Had a fall or experienced change in No Transfer Assistance: None activities of daily living that may affect Patient Identification Verified: Yes risk of falls: Secondary Verification Process Completed: Yes Signs or symptoms of abuse/neglect since last visito No Patient Requires Transmission-Based Precautions: No Hospitalized since last visit: No Patient Has Alerts: Yes Implantable device outside of the clinic excluding No cellular tissue based products placed in the center since last visit: Pain Present Now: No Electronic Signature(s) Signed: 09/25/2021 4:18:46 PM By: [name] EMT Entered By: [name] on 09/25/2021 16:18:46 -------------------------------------------------------------------------------- Encounter Discharge Information Details Patient Name: Date of Service: [place], [name] 09/25/2021 8:00 A M Medical Record Number: [id number] Patient Account Number: [id number] Date of Birth/Sex: Treating RN: 1970/03/21 [name], [name] Primary Care Maddoxx Burkitt: [name] Other Clinician: Valeria [name] Referring Kitzia Camus: Treating Yanissa Michalsky/Extender: [name] in Treatment: 11 Encounter Discharge Information  Items Discharge Condition: Stable Ambulatory Status: Ambulatory Discharge Destination: Home Transportation: Private Auto Accompanied By: None Schedule Follow-up Appointment: Yes Clinical Summary of Care: Electronic Signature(s) Signed: 09/25/2021 4:25:03 PM By: [name] EMT Entered By: [name] on 09/25/2021 16:25:03 -------------------------------------------------------------------------------- Vitals Details Patient Name: Date of Service: [name], [name] 09/25/2021 8:00 A M Medical Record Number: [id number] Patient Account Number: [id number] Date of Birth/Sex: Treating RN: 1970/04/15 [name], [name] Primary Care Kyri Shader: [name] Other Clinician: Valeria [name] Referring Rashid Whitenight: Treating Kayti Poss/Extender: [name] in Treatment: 11 Vital Signs Time Taken: 07:58 Temperature (F): 97.8 Height (in): 72 Pulse (bpm): 92 Weight (lbs): 312 Respiratory Rate (breaths/min): 16 Body Mass Index (BMI): 42.3 Blood Pressure (mmHg): 131/78 Reference Range: 80 - 120 mg / dl Electronic Signature(s) Signed: 09/25/2021 4:19:17 PM By: [name] EMT Entered By: [name] on 09/25/2021 16:19:16

## 2021-09-26 ENCOUNTER — Encounter (HOSPITAL_BASED_OUTPATIENT_CLINIC_OR_DEPARTMENT_OTHER): Payer: BLUE CROSS/BLUE SHIELD | Admitting: General Surgery

## 2021-09-26 DIAGNOSIS — E1169 Type 2 diabetes mellitus with other specified complication: Secondary | ICD-10-CM | POA: Diagnosis not present

## 2021-09-26 LAB — GLUCOSE, CAPILLARY
Glucose-Capillary: 148 mg/dL — ABNORMAL HIGH (ref 70–99)
Glucose-Capillary: 130 mg/dL — ABNORMAL HIGH (ref 70–99)

## 2021-09-26 NOTE — Progress Notes (Addendum)
[name], [name] ([id number]) Visit Report for 09/26/2021 HBO Details Patient Name: Date of Service: [name], [name] 09/26/2021 8:00 A M Medical Record Number: [id number] Patient Account Number: [id number] Date of Birth/Sex: Treating RN: 21-Oct-1970 [name] Primary Care [name]: [name] Other Clinician: [name] Referring Nimrat Woolworth: Treating Josias Tomerlin/Extender: [name] in Treatment: 12 HBO Treatment Course Details Treatment Course Number: 1 Ordering Geovany Trudo: [name] T Treatments Ordered: otal 40 HBO Treatment Start Date: 08/14/2021 HBO Indication: Diabetic Ulcer(s) of the Lower Extremity Standard/Conservative Wound Care tried and failed greater than or equal to 30 days Wound #2 Right, Lateral, Plantar Foot , Wound #3R Right, Lateral Foot HBO Treatment Details Treatment Number: 31 Patient Type: Outpatient Chamber Type: Monoplace Chamber Serial #: [id number] Treatment Protocol: 2.0 ATA with 90 minutes oxygen, and no air breaks Treatment Details Compression Rate Down: 2.0 psi / minute De-Compression Rate Up: 2.0 psi / minute Air breaks and breathing Decompress Decompress Compress Tx Pressure Begins Reached periods Begins Ends (leave unused spaces blank) Chamber Pressure (ATA 1 2 ------2 1 ) Clock Time (24 hr) 08:23 08:31 - - - - - - 10:02 10:11 Treatment Length: 108 (minutes) Treatment Segments: 4 Vital Signs Capillary Blood Glucose Reference Range: 80 - 120 mg / dl HBO Diabetic Blood Glucose Intervention Range: <131 mg/dl or >249 mg/dl Time Vitals Blood Respiratory Capillary Blood Glucose Pulse Action Type: Pulse: Temperature: Taken: Pressure: Rate: Glucose (mg/dl): Meter #: Oximetry (%) Taken: Pre 08:08 132/77 100 16 98.6 148 Post 10:17 145/87 70 97.3 130 Treatment Response Treatment Toleration: Well Treatment Completion Status: Treatment Completed without Adverse Event Additional Procedure  Documentation Tissue Sevierity: Necrosis of bone Physician HBO Attestation: I certify that I supervised this HBO treatment in accordance with Medicare guidelines. A trained emergency response team is readily available per Yes hospital policies and procedures. Continue HBOT as ordered. Yes Electronic Signature(s) Signed: 09/26/2021 1:49:22 PM By: [name] MD FACS Previous Signature: 09/26/2021 1:42:46 PM Version By: [name] EMT Entered By: [name] on 09/26/2021 13:49:22 -------------------------------------------------------------------------------- HBO Safety Checklist Details Patient Name: Date of Service: [name], [name] 09/26/2021 8:00 A M Medical Record Number: [id number] Patient Account Number: [id number] Date of Birth/Sex: Treating RN: 1970-12-12 [name] Primary Care [name]: [name] Other Clinician: [name] Referring Charman Blasco: Treating Bradin Mcadory/Extender: [name] in Treatment: 12 HBO Safety Checklist Items Safety Checklist Consent Form Signed Patient voided / foley secured and emptied When did you last eato 0705 Last dose of injectable or oral agent 0650 Ostomy pouch emptied and vented if applicable NA All implantable devices assessed, documented and approved NA Intravenous access site secured and place NA Valuables secured Linens and cotton and cotton/polyester blend (less than 51% polyester) Personal oil-based products / skin lotions / body lotions removed Wigs or hairpieces removed NA Smoking or tobacco materials removed Books / newspapers / magazines / loose paper removed Cologne, aftershave, perfume and deodorant removed Jewelry removed (may wrap wedding band) NA Make-up removed NA Hair care products removed Battery operated devices (external) removed Heating patches and chemical warmers removed Titanium eyewear removed Nail polish cured greater than 10  hours NA Casting material cured greater than 10 hours NA Hearing aids removed NA Loose dentures or partials removed NA Prosthetics have been removed NA Patient demonstrates correct use of air break device (if applicable) Patient concerns have been addressed Patient grounding bracelet on and cord attached to chamber Specifics for Inpatients (  complete in addition to above) Medication sheet sent with patient NA Intravenous medications needed or due during therapy sent with patient NA Drainage tubes (e.g. nasogastric tube or chest tube secured and vented) NA Endotracheal or Tracheotomy tube secured NA Cuff deflated of air and inflated with saline NA Airway suctioned NA Notes The safety checklist was done before the treatment was started Electronic Signature(s) Signed: 09/26/2021 1:41:29 PM By: [name] EMT Entered By: [name] on 09/26/2021 13:41:29

## 2021-09-26 NOTE — Progress Notes (Signed)
[name], [name] ([id number]) Visit Report for 09/26/2021 Problem List Details Patient Name: Date of Service: [name], [name] 09/26/2021 8:00 A M Medical Record Number: [id number] Patient Account Number: [id number] Date of Birth/Sex: Treating RN: 06-Mar-1970 (51 y.o. 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 09/26/2021 Medical Record Number: [id number] Patient Account Number: [id number] Date of Birth/Sex: Treating RN: [birth year] (51 y.o. 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 MG64.403Other chronic osteomyelitis, right ankle and foot L97.514  Non-pressure chronic ulcer of other part of right foot with necrosis of bone Modifier: Quantity: 4 Physician Procedures : CPT4 Code Description Modifier [id number]- WC PHYS HYPERBARIC OXYGEN THERAPY ICD-10 Diagnosis Description 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

## 2021-09-26 NOTE — Progress Notes (Signed)
[name], Jawon M. ([id number]) Visit Report for 09/26/2021 Arrival Information Details Patient Name: Date of Service: [place], [place] [name] 09/26/2021 8:00 A M Medical Record Number: [id number] Patient Account Number: [id number] Date of Birth/Sex: Treating RN: May 06, 1970 (51 y.o. [name] Primary Care Raea Magallon: [name] Other Clinician: [name] Referring Layaan Mott: Treating Torin Whisner/Extender: [name] in Treatment: 12 Visit Information History Since Last Visit All ordered tests and consults were completed: Yes Patient Arrived: Ambulatory Added or deleted any medications: No Arrival Time: 07:48 Any new allergies or adverse reactions: No Accompanied By: None Had a fall or experienced change in No Transfer Assistance: None activities of daily living that may affect Patient Identification Verified: Yes risk of falls: Secondary Verification Process Completed: Yes Signs or symptoms of abuse/neglect since last visito No Patient Requires Transmission-Based Precautions: No Hospitalized since last visit: No Patient Has Alerts: Yes Implantable device outside of the clinic excluding No cellular tissue based products placed in the center since last visit: Pain Present Now: No Electronic Signature(s) Signed: 09/26/2021 1:39:53 PM By: [name] EMT Entered By: [name] on 09/26/2021 13:39:53 -------------------------------------------------------------------------------- Encounter Discharge Information Details Patient Name: Date of Service: [place], [name] 09/26/2021 8:00 A M Medical Record Number: [id number] Patient Account Number: [id number] Date of Birth/Sex: Treating RN: Jun 24, 1970 (51 y.o. [name] Primary Care Tanaysha Alkins: [name] Other Clinician: [name] Referring Mariza Bourget: Treating Ane Conerly/Extender: [name] in Treatment: 12 Encounter Discharge Information Items Discharge  Condition: Stable Ambulatory Status: Ambulatory Discharge Destination: Home Transportation: Private Auto Accompanied By: None Schedule Follow-up Appointment: Yes Clinical Summary of Care: Electronic Signature(s) Signed: 09/26/2021 1:43:54 PM By: [name] EMT Entered By: [name] on 09/26/2021 13:43:54 -------------------------------------------------------------------------------- Vitals Details Patient Name: Date of Service: [name], [name] 09/26/2021 8:00 A M Medical Record Number: [id number] Patient Account Number: [id number] Date of Birth/Sex: Treating RN: 1970/09/29 (51 y.o. [name] Primary Care Shatasia Cutshaw: [name] Other Clinician: [name] Referring Alis Sawchuk: Treating Zach Tietje/Extender: [name] in Treatment: 12 Vital Signs Time Taken: 08:08 Temperature (F): 98.6 Height (in): 72 Pulse (bpm): 100 Weight (lbs): 312 Respiratory Rate (breaths/min): 16 Body Mass Index (BMI): 42.3 Blood Pressure (mmHg): 132/77 Capillary Blood Glucose (mg/dl): 148 Reference Range: 80 - 120 mg / dl Electronic Signature(s) Signed: 09/26/2021 1:40:26 PM By: [name] EMT Entered By: [name] on 09/26/2021 13:40:26

## 2021-09-27 ENCOUNTER — Encounter (HOSPITAL_BASED_OUTPATIENT_CLINIC_OR_DEPARTMENT_OTHER): Payer: BLUE CROSS/BLUE SHIELD | Admitting: Internal Medicine

## 2021-09-27 DIAGNOSIS — L97514 Non-pressure chronic ulcer of other part of right foot with necrosis of bone: Secondary | ICD-10-CM | POA: Diagnosis not present

## 2021-09-27 DIAGNOSIS — M86671 Other chronic osteomyelitis, right ankle and foot: Secondary | ICD-10-CM

## 2021-09-27 DIAGNOSIS — E1149 Type 2 diabetes mellitus with other diabetic neurological complication: Secondary | ICD-10-CM | POA: Diagnosis not present

## 2021-09-27 DIAGNOSIS — E1169 Type 2 diabetes mellitus with other specified complication: Secondary | ICD-10-CM | POA: Diagnosis not present

## 2021-09-27 LAB — GLUCOSE, CAPILLARY
Glucose-Capillary: 124 mg/dL — ABNORMAL HIGH (ref 70–99)
Glucose-Capillary: 119 mg/dL — ABNORMAL HIGH (ref 70–99)
Glucose-Capillary: 132 mg/dL — ABNORMAL HIGH (ref 70–99)
Glucose-Capillary: 146 mg/dL — ABNORMAL HIGH (ref 70–99)

## 2021-09-27 NOTE — Progress Notes (Signed)
[name], Brixon M. ([id number]) Visit Report for 09/27/2021 Arrival Information Details Patient Name: Date of Service: [place], [place] [name] 09/27/2021 8:00 A M Medical Record Number: [id number] Patient Account Number: [id number] Date of Birth/Sex: Treating RN: 22-Jun-1970 (51 y.o. [name] Primary Care Patrina Andreas: [name] Other Clinician: [name] Referring Kasean Denherder: Treating Barbette Mcglaun/Extender: [name] in Treatment: 12 Visit Information History Since Last Visit All ordered tests and consults were completed: Yes Patient Arrived: Ambulatory Added or deleted any medications: No Arrival Time: 07:38 Any new allergies or adverse reactions: No Accompanied By: None Had a fall or experienced change in No Transfer Assistance: None activities of daily living that may affect Patient Identification Verified: Yes risk of falls: Secondary Verification Process Completed: Yes Signs or symptoms of abuse/neglect since last visito No Patient Requires Transmission-Based Precautions: No Hospitalized since last visit: No Patient Has Alerts: Yes Implantable device outside of the clinic excluding No cellular tissue based products placed in the center since last visit: Pain Present Now: No Electronic Signature(s) Signed: 09/27/2021 12:45:44 PM By: [name] EMT Entered By: [name] on 09/27/2021 12:45:43 -------------------------------------------------------------------------------- Encounter Discharge Information Details Patient Name: Date of Service: [name], [name] 09/27/2021 8:00 A M Medical Record Number: [id number] Patient Account Number: [id number] Date of Birth/Sex: Treating RN: 03-Jan-1971 (51 y.o. [name] Primary Care Gamal Todisco: [name] Other Clinician: [name] Referring Jakaila Norment: Treating Jahzeel Poythress/Extender: [name] in Treatment: 12 Encounter Discharge Information  Items Discharge Condition: Stable Ambulatory Status: Ambulatory Discharge Destination: Home Transportation: Private Auto Accompanied By: None Schedule Follow-up Appointment: Yes Clinical Summary of Care: Electronic Signature(s) Signed: 09/27/2021 12:57:15 PM By: [name] EMT Entered By: [name] on 09/27/2021 12:57:14 -------------------------------------------------------------------------------- Vitals Details Patient Name: Date of Service: [name], [name] 09/27/2021 8:00 A M Medical Record Number: [id number] Patient Account Number: [id number] Date of Birth/Sex: Treating RN: [birth year] (51 y.o. [name] Primary Care Jarren Para: [name] Other Clinician: [name] Referring Kaydyn Sayas: Treating Marsh Heckler/Extender: [name] in Treatment: 12 Vital Signs Time Taken: 08:18 Temperature (F): 97.8 Height (in): 72 Pulse (bpm): 88 Weight (lbs): 312 Respiratory Rate (breaths/min): 46 Body Mass Index (BMI): 42.3 Blood Pressure (mmHg): 134/84 Capillary Blood Glucose (mg/dl): 124 Reference Range: 80 - 120 mg / dl Electronic Signature(s) Signed: 09/27/2021 12:46:15 PM By: [name] EMT Entered By: [name] on 09/27/2021 12:46:14

## 2021-09-27 NOTE — Progress Notes (Addendum)
[name], Darril M. ([id number]) Visit Report for 09/27/2021 HBO Details Patient Name: Date of Service: [name], [name] 09/27/2021 8:00 A M Medical Record Number: [id number] Patient Account Number: [id number] Date of Birth/Sex: Treating RN: [birth year] (51 y.o. Male) Dellie Catholic Primary Care Thelia Tanksley: [name] Other Clinician: [name] Referring Mariska Daffin: Treating Idell Hissong/Extender: [name] in Treatment: 12 HBO Treatment Course Details Treatment Course Number: 1 Ordering Bentlie Catanzaro: [name] T Treatments Ordered: otal 40 HBO Treatment Start Date: 08/14/2021 HBO Indication: Diabetic Ulcer(s) of the Lower Extremity Standard/Conservative Wound Care tried and failed greater than or equal to 30 days Wound #2 Right, Lateral, Plantar Foot , Wound #3R Right, Lateral Foot HBO Treatment Details Treatment Number: 32 Patient Type: Outpatient Chamber Type: Monoplace Chamber Serial #: [id number] Treatment Protocol: 2.0 ATA with 90 minutes oxygen, and no air breaks Treatment Details Compression Rate Down: 2.0 psi / minute De-Compression Rate Up: 2.0 psi / minute Air breaks and breathing Decompress Decompress Compress Tx Pressure Begins Reached periods Begins Ends (leave unused spaces blank) Chamber Pressure (ATA 1 2 ------2 1 ) Clock Time (24 hr) 09:07 09:16 - - - - - - 10:46 10:53 Treatment Length: 106 (minutes) Treatment Segments: 4 Vital Signs Capillary Blood Glucose Reference Range: 80 - 120 mg / dl HBO Diabetic Blood Glucose Intervention Range: <131 mg/dl or >249 mg/dl Type: Time Vitals Blood Pulse: Respiratory Capillary Blood Glucose Pulse Action Temperature: Taken: Pressure: Rate: Glucose (mg/dl): Meter #: Oximetry (%) Taken: Pre 08:18 134/84 88 46 97.8 124 Patient given 8 oz glucerna Post 10:58 144/87 70 16 98 146 Pre 08:42 119 Patient given 8 oz Glucerna Treatment Response Treatment Toleration: Well Treatment  Completion Status: Treatment Completed without Adverse Event Treatment Notes The patient blood sugar was 124 @ 0818, The patient was given 8 oz Glucerna, and the blood sugar was recheck @ [phone] with a reading of 119. I spoke with Dr. Heber [place]. She stated to give the patient another 8 oz Glucerna, wait and recheck Blood sugar. The blood sugar was recheck @ 0906 and was 132. I spoke with Dr. Heber [place]: she stated to start treatment. No problems noted in treatment. Post Blood sugar was 146. Additional Procedure Documentation Tissue Sevierity: Necrosis of bone Physician HBO Attestation: I certify that I supervised this HBO treatment in accordance with Medicare guidelines. A trained emergency response team is readily available per Yes hospital policies and procedures. Continue HBOT as ordered. Yes Electronic Signature(s) Signed: 09/27/2021 3:02:11 PM By: [name] DO Previous Signature: 09/27/2021 12:56:15 PM Version By: [name] EMT Entered By: [name] on 09/27/2021 14:59:52 -------------------------------------------------------------------------------- HBO Safety Checklist Details Patient Name: Date of Service: [name], [name] 09/27/2021 8:00 A M Medical Record Number: [id number] Patient Account Number: [id number] Date of Birth/Sex: Treating RN: 04/21/1970 (51 y.o. Male) Dellie Catholic Primary Care [place] Current: [name] Other Clinician: [name] Referring Ciel Chervenak: Treating Kaesha Kirsch/Extender: [name] in Treatment: 12 HBO Safety Checklist Items Safety Checklist Consent Form Signed Patient voided / foley secured and emptied When did you last eato 0650 Last dose of injectable or oral agent 5102 Ostomy pouch emptied and vented if applicable NA All implantable devices assessed, documented and approved NA Intravenous access site secured and place NA Valuables secured Linens and cotton and cotton/polyester blend (less than 51%  polyester) Personal oil-based products / skin lotions / body lotions removed Wigs or hairpieces removed NA Smoking or tobacco materials removed Books / newspapers / magazines / loose paper  removed Cologne, aftershave, perfume and deodorant removed Jewelry removed (may wrap wedding band) NA Make-up removed NA Hair care products removed Battery operated devices (external) removed Heating patches and chemical warmers removed Titanium eyewear removed NA Nail polish cured greater than 10 hours NA Casting material cured greater than 10 hours NA Hearing aids removed NA Loose dentures or partials removed NA Prosthetics have been removed NA Patient demonstrates correct use of air break device (if applicable) Patient concerns have been addressed Patient grounding bracelet on and cord attached to chamber Specifics for Inpatients (complete in addition to above) Medication sheet sent with patient NA Intravenous medications needed or due during therapy sent with patient NA Drainage tubes (e.g. nasogastric tube or chest tube secured and vented) NA Endotracheal or Tracheotomy tube secured NA Cuff deflated of air and inflated with saline NA Airway suctioned NA Notes The safety checklist was done before the treatment was started Electronic Signature(s) Signed: 09/27/2021 12:48:05 PM By: [name] EMT Entered By: [name] on 09/27/2021 12:48:04

## 2021-09-27 NOTE — Progress Notes (Signed)
[name], Sayan M. ([id number]) Visit Report for 09/27/2021 Problem List Details Patient Name: Date of Service: [name], [name] 09/27/2021 8:00 A M Medical Record Number: [id number] Patient Account Number: [id number] Date of Birth/Sex: Treating RN: 09-09-1970 (51 y.o. 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 09/27/2021 Medical Record Number: [id number] Patient Account Number: [id number] Date of Birth/Sex: Treating RN: 24-Aug-1970 (51 y.o. 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 LF75.883Non-pressure chronic ulcer of other part of right foot with necrosis  of bone M86.671 Other chronic osteomyelitis, right ankle and foot Modifier: Quantity: 4 Physician Procedures : CPT4 Code Description Modifier [id number] 41583- WC PHYS HYPERBARIC OXYGEN THERAPY ICD-10 Diagnosis Description 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

## 2021-09-30 ENCOUNTER — Encounter (HOSPITAL_BASED_OUTPATIENT_CLINIC_OR_DEPARTMENT_OTHER): Payer: BLUE CROSS/BLUE SHIELD | Admitting: Internal Medicine

## 2021-09-30 DIAGNOSIS — L97514 Non-pressure chronic ulcer of other part of right foot with necrosis of bone: Secondary | ICD-10-CM | POA: Diagnosis not present

## 2021-09-30 DIAGNOSIS — E1149 Type 2 diabetes mellitus with other diabetic neurological complication: Secondary | ICD-10-CM

## 2021-09-30 DIAGNOSIS — M86671 Other chronic osteomyelitis, right ankle and foot: Secondary | ICD-10-CM

## 2021-09-30 DIAGNOSIS — E1169 Type 2 diabetes mellitus with other specified complication: Secondary | ICD-10-CM | POA: Diagnosis not present

## 2021-09-30 LAB — GLUCOSE, CAPILLARY
Glucose-Capillary: 145 mg/dL — ABNORMAL HIGH (ref 70–99)
Glucose-Capillary: 132 mg/dL — ABNORMAL HIGH (ref 70–99)

## 2021-09-30 NOTE — Progress Notes (Addendum)
[name], Danie M. ([id number]) Visit Report for 09/30/2021 Arrival Information Details Patient Name: Date of Service: [place], [place] [name] 09/30/2021 8:30 A M Medical Record Number: [id number] Patient Account Number: [id number] Date of Birth/Sex: Treating RN: [birth year] (51 y.o. [name], [place] Primary Care Rifka Ramey: [name] Other Clinician: [name] Referring Jaleal Schliep: Treating Santa Abdelrahman/Extender: [name] in Treatment: 12 Visit Information History Since Last Visit All ordered tests and consults were completed: Yes Patient Arrived: Ambulatory Added or deleted any medications: No Arrival Time: 07:34 Any new allergies or adverse reactions: No Accompanied By: None Had a fall or experienced change in No Transfer Assistance: None activities of daily living that may affect Patient Identification Verified: Yes risk of falls: Secondary Verification Process Completed: Yes Signs or symptoms of abuse/neglect since last visito No Patient Requires Transmission-Based Precautions: No Hospitalized since last visit: No Patient Has Alerts: Yes Implantable device outside of the clinic excluding No cellular tissue based products placed in the center since last visit: Pain Present Now: No Electronic Signature(s) Signed: 09/30/2021 3:01:39 PM By: [name] EMT Entered By: [name] on 09/30/2021 15:01:39 -------------------------------------------------------------------------------- Encounter Discharge Information Details Patient Name: Date of Service: Register, [name] 09/30/2021 8:30 A M Medical Record Number: [id number] Patient Account Number: [id number] Date of Birth/Sex: Treating RN: 1970/06/29 (51 y.o. [name] Kayliee Atienza: [name] Other Clinician: [name] Referring Codylee Patil: Treating Ianmichael Amescua/Extender: [name] in Treatment: 12 Encounter Discharge Information  Items Discharge Condition: Stable Ambulatory Status: Ambulatory Discharge Destination: Home Transportation: Private Auto Accompanied By: None Schedule Follow-up Appointment: Yes Clinical Summary of Care: Electronic Signature(s) Signed: 09/30/2021 3:05:37 PM By: [name] EMT Entered By: [name] on 09/30/2021 15:05:37 -------------------------------------------------------------------------------- Vitals Details Patient Name: Date of Service: [name], [name] 09/30/2021 8:30 A M Medical Record Number: [id number] Patient Account Number: [id number] Date of Birth/Sex: Treating RN: 09-14-1970 (51 y.o. [name] Talyah Seder: [name] Other Clinician: [name] Referring Armanii Urbanik: Treating Misako Roeder/Extender: [name] in Treatment: 12 Vital Signs Time Taken: 08:10 Temperature (F): 98.4 Height (in): 72 Pulse (bpm): 93 Weight (lbs): 312 Respiratory Rate (breaths/min): 16 Body Mass Index (BMI): 42.3 Blood Pressure (mmHg): 132/84 Capillary Blood Glucose (mg/dl): 145 Reference Range: 80 - 120 mg / dl Electronic Signature(s) Signed: 09/30/2021 3:02:08 PM By: [name] EMT Entered By: [name] on 09/30/2021 15:02:08

## 2021-09-30 NOTE — Progress Notes (Signed)
[name], [name] ([id number]) Visit Report for 09/30/2021 HBO Details Patient Name: Date of Service: [name], [place] [name] 09/30/2021 8:30 A M Medical Record Number: [id number] Patient Account Number: [id number] Date of Birth/Sex: Treating RN: 03-Mar-1970 (51 y.o. [name] Athene Schuhmacher: [name] Other Clinician: [name] Referring Jaqwon Manfred: Treating Nelvin Tomb/Extender: [name] in Treatment: 12 HBO Treatment Course Details Treatment Course Number: 1 Ordering Clinton Wahlberg: [name] T Treatments Ordered: otal 40 HBO Treatment Start Date: 08/14/2021 HBO Indication: Diabetic Ulcer(s) of the Lower Extremity Standard/Conservative Wound Care tried and failed greater than or equal to 30 days Wound #2 Right, Lateral, Plantar Foot , Wound #3R Right, Lateral Foot HBO Treatment Details Treatment Number: 33 Patient Type: Outpatient Chamber Type: Monoplace Chamber Serial #: [id number] Treatment Protocol: 2.0 ATA with 90 minutes oxygen, and no air breaks Treatment Details Compression Rate Down: 2.0 psi / minute De-Compression Rate Up: 2.0 psi / minute Air breaks and breathing Decompress Decompress Compress Tx Pressure Begins Reached periods Begins Ends (leave unused spaces blank) Chamber Pressure (ATA 1 2 ------2 1 ) Clock Time (24 hr) 08:17 08:26 - - - - - - 09:56 10:03 Treatment Length: 106 (minutes) Treatment Segments: 4 Vital Signs Capillary Blood Glucose Reference Range: 80 - 120 mg / dl HBO Diabetic Blood Glucose Intervention Range: <131 mg/dl or >249 mg/dl Time Vitals Blood Respiratory Capillary Blood Glucose Pulse Action Type: Pulse: Temperature: Taken: Pressure: Rate: Glucose (mg/dl): Meter #: Oximetry (%) Taken: Pre 08:10 132/84 93 16 98.4 145 Post 10:05 130/92 76 16 97.2 132 Treatment Response Treatment Toleration: Well Treatment Completion Status: Treatment Completed without Adverse Event Additional  Procedure Documentation Tissue Sevierity: Necrosis of bone Physician HBO Attestation: I certify that I supervised this HBO treatment in accordance with Medicare guidelines. A trained emergency response team is readily available per Yes hospital policies and procedures. Continue HBOT as ordered. Yes Electronic Signature(s) Signed: 10/01/2021 11:20:59 AM By: [name] DO Previous Signature: 09/30/2021 3:04:46 PM Version By: [name] EMT Entered By: [name] on 09/30/2021 16:18:45 -------------------------------------------------------------------------------- HBO Safety Checklist Details Patient Name: Date of Service: [name], [name] 09/30/2021 8:30 A M Medical Record Number: [id number] Patient Account Number: [id number] Date of Birth/Sex: Treating RN: [birth year] (51 y.o. [name] Mikayela Deats: [name] Other Clinician: [name] Referring Ulric Salzman: Treating Sachin Ferencz/Extender: [name] in Treatment: 12 HBO Safety Checklist Items Safety Checklist Consent Form Signed Patient voided / foley secured and emptied When did you last eato 0655 Last dose of injectable or oral agent 0645 Ostomy pouch emptied and vented if applicable NA All implantable devices assessed, documented and approved NA Intravenous access site secured and place NA Valuables secured Linens and cotton and cotton/polyester blend (less than 51% polyester) Personal oil-based products / skin lotions / body lotions removed Wigs or hairpieces removed NA Smoking or tobacco materials removed Books / newspapers / magazines / loose paper removed Cologne, aftershave, perfume and deodorant removed Jewelry removed (may wrap wedding band) NA Make-up removed NA Hair care products removed Battery operated devices (external) removed Heating patches and chemical warmers removed Titanium eyewear removed NA Nail polish cured greater than 10  hours NA Casting material cured greater than 10 hours NA Hearing aids removed NA Loose dentures or partials removed NA Prosthetics have been removed NA Patient demonstrates correct use of air break device (if applicable) Patient concerns have been addressed Patient grounding bracelet on and cord attached to chamber Specifics for  Inpatients (complete in addition to above) Medication sheet sent with patient NA Intravenous medications needed or due during therapy sent with patient NA Drainage tubes (e.g. nasogastric tube or chest tube secured and vented) NA Endotracheal or Tracheotomy tube secured NA Cuff deflated of air and inflated with saline NA Airway suctioned NA Notes The safety checklist was done before the treatment was started Electronic Signature(s) Signed: 09/30/2021 3:03:51 PM By: [name] EMT Entered By: [name] on 09/30/2021 15:03:50

## 2021-09-30 NOTE — Progress Notes (Signed)
[name], [name] ([id number]) Visit Report for 09/30/2021 Arrival Information Details Patient Name: Date of Service: [name], [name] [place] [name] 09/30/2021 7:45 A M Medical Record Number: [id number] Patient Account Number: [id number] Date of Birth/Sex: Treating RN: 02-01-1970 (52 y.o. [name] Primary Care Powell Halbert: [name] Other Clinician: Referring Nia Nathaniel: Treating Devita Nies/Extender: [name]2 Visit Information History Since Last Visit Added or deleted any medications: No Patient Arrived: Ambulatory Any new allergies or adverse reactions: No Arrival Time: 07:35 Had a fall or experienced change [name] No Accompanied By: self activities of daily living that may affect Transfer Assistance: None risk of falls: Patient Identification Verified: Yes Signs or symptoms of abuse/neglect since last visito No Secondary Verification Process Completed: Yes Hospitalized since last visit: No Patient Requires Transmission-Based Precautions: No Implantable device outside of the clinic excluding No Patient Has Alerts: Yes cellular tissue based products placed [name] the center since last visit: Has Dressing [name] Place as Prescribed: Yes Pain Present Now: No Electronic Signature(s) Signed: 09/30/2021 5:14:23 PM By: Adline Peals Entered By: Adline Peals on 09/30/2021 07:37:02 -------------------------------------------------------------------------------- Encounter Discharge Information Details Patient Name: Date of Service: [name], [name] 09/30/2021 7:45 A M Medical Record Number: [id number] Patient Account Number: [id number] Date of Birth/Sex: Treating RN: 05-23-1970 (51 y.o. [name] Primary Care Heavan Francom: [name] Other Clinician: Referring Devery Odwyer: Treating Xoey Warmoth/Extender: [name]2 Encounter Discharge Information Items Post Procedure Vitals Discharge  Condition: Stable Temperature (F): 98.8 Ambulatory Status: Ambulatory Pulse (bpm): 92 Discharge Destination: Home Respiratory Rate (breaths/min): 18 Transportation: Private Auto Blood Pressure (mmHg): 144/83 Accompanied By: self Schedule Follow-up Appointment: Yes Clinical Summary of Care: Patient Declined Electronic Signature(s) Signed: 09/30/2021 5:14:23 PM By: Adline Peals Entered By: Adline Peals on 09/30/2021 08:08:31 -------------------------------------------------------------------------------- Lower Extremity Assessment Details Patient Name: Date of Service: [name], [name] 09/30/2021 7:45 A M Medical Record Number: [id number] Patient Account Number: [id number] Date of Birth/Sex: Treating RN: 1970/09/04 (51 y.o. [name] Primary Care Elesa Garman: [name] Other Clinician: Referring Dmario Russom: Treating Trystin Hargrove/Extender: [name]2 Edema Assessment Assessed: [name]: No] [name]: No] Edema: [Left: N] [Right: o] Calf Left: Right: Point of Measurement: From Medial Instep 39.5 cm Ankle Left: Right: Point of Measurement: From Medial Instep 23.5 cm Vascular Assessment Pulses: Dorsalis Pedis Palpable: [Right:Yes] Electronic Signature(s) Signed: 09/30/2021 5:14:23 PM By: Adline Peals Entered By: Adline Peals on 09/30/2021 07:40:12 -------------------------------------------------------------------------------- Multi Wound Chart Details Patient Name: Date of Service: [name], [name] 09/30/2021 7:45 A M Medical Record Number: [id number] Patient Account Number: [id number] Date of Birth/Sex: Treating RN: 31-Oct-1970 (51 y.o. M) Primary Care Adrianna Dudas: [name] Other Clinician: Referring Maddon Horton: Treating Marcile Fuquay/Extender: [name]2 Vital Signs Height([name]: 72 Pulse(bpm): 92 Weight(lbs): 312 Blood Pressure(mmHg): 144/83 Body  Mass Index(BMI): 42.3 Temperature(F): 98.8 Respiratory Rate(breaths/min): 18 Photos: [2:Right, Lateral, Plantar Foot] [N/A:N/A N/A] Wound Location: [2:Gradually Appeared] [N/A:N/A] Wounding Event: [2:Diabetic Wound/Ulcer of the Lower] [N/A:N/A] Primary Etiology: [2:Extremity Type II Diabetes] [N/A:N/A] Comorbid History: [2:12/19/2016] [N/A:N/A] Date Acquired: [2:12] [N/A:N/A] Weeks of Treatment: [2:Open] [N/A:N/A] Wound Status: [2:No] [N/A:N/A] Wound Recurrence: [2:0.1x1.5x1.2] [N/A:N/A] Measurements L x W x D (cm) [2:0.118] [N/A:N/A] A (cm) : rea [2:0.141] [N/A:N/A] Volume (cm) : [2:96.70%] [N/A:N/A] % Reduction [name] A [2:rea: 99.20%] [N/A:N/A] % Reduction [name] Volume: [2:Grade 3] [N/A:N/A] Classification: [2:Medium] [N/A:N/A] Exudate A mount: [2:Serosanguineous] [N/A:N/A] Exudate Type: [2:red, brown] [N/A:N/A] Exudate Color: [2:Thickened] [N/A:N/A] Wound  Margin: [2:Large (67-100%)] [N/A:N/A] Granulation A mount: [2:Pink] [N/A:N/A] Granulation Quality: [2:Small (1-33%)] [N/A:N/A] Necrotic A mount: [2:Fat Layer (Subcutaneous Tissue): Yes N/A] Exposed Structures: [2:Fascia: No Tendon: No Muscle: No Joint: No Bone: No Small (1-33%)] [N/A:N/A] Epithelialization: [2:Debridement - Selective/Open Wound N/A] Debridement: Pre-procedure Verification/Time Out 07:46 [N/A:N/A] Taken: [2:Lidocaine 4% Topical Solution] [N/A:N/A] Pain Control: [2:Callus] [N/A:N/A] Tissue Debrided: [2:Skin/Epidermis] [N/A:N/A] Level: [2:0.15] [N/A:N/A] Debridement A (sq cm): [2:rea Curette] [N/A:N/A] Instrument: [2:Minimum] [N/A:N/A] Bleeding: [2:Pressure] [N/A:N/A] Hemostasis A chieved: [2:0] [N/A:N/A] Procedural Pain: [2:0] [N/A:N/A] Post Procedural Pain: [2:Procedure was tolerated well] [N/A:N/A] Debridement Treatment Response: [2:0.1x1.5x1.2] [N/A:N/A] Post Debridement Measurements L x W x D (cm) [2:0.141] [N/A:N/A] Post Debridement Volume: (cm) [2:Debridement] [N/A:N/A] Treatment Notes Electronic  Signature(s) Signed: 09/30/2021 7:51:52 AM By: [name] MD FACS Entered By: [name] on 09/30/2021 07:51:52 -------------------------------------------------------------------------------- Multi-Disciplinary Care Plan Details Patient Name: Date of Service: [name], [name] 09/30/2021 7:45 A M Medical Record Number: [id number] Patient Account Number: [id number] Date of Birth/Sex: Treating RN: 02/02/1970 (51 y.o. [name] Primary Care Boen Sterbenz: [name] Other Clinician: Referring Oddie Bottger: Treating Aayliah Rotenberry/Extender: [name]: [place] reviewed with physician Active Inactive Nutrition Nursing Diagnoses: Impaired glucose control: actual or potential Potential for alteratiion [name] Nutrition/Potential for imbalanced nutrition Goals: Patient/caregiver will maintain therapeutic glucose control Date Initiated: 07/29/2021 Target Resolution Date: 11/19/2021 Goal Status: Active Interventions: Assess patient nutrition upon admission and as needed per policy Provide education on elevated blood sugars and impact on wound healing Treatment Activities: Patient referred to Primary Care Physician for further nutritional evaluation : 07/29/2021 Notes: Osteomyelitis Nursing Diagnoses: Infection: osteomyelitis Knowledge deficit related to disease process and management Goals: Patient/caregiver will verbalize understanding of disease process and disease management Date Initiated: 07/29/2021 Target Resolution Date: 11/19/2021 Goal Status: Active Patient's osteomyelitis will resolve Date Initiated: 07/29/2021 Target Resolution Date: 11/19/2021 Goal Status: Active Interventions: Assess for signs and symptoms of osteomyelitis resolution every visit Provide education on osteomyelitis Treatment Activities: Consult for HBO : 07/29/2021 Notes: Wound/Skin Impairment Nursing Diagnoses: Impaired tissue  integrity Goals: Patient/caregiver will verbalize understanding of skin care regimen Date Initiated: 07/04/2021 Target Resolution Date: 11/19/2021 Goal Status: Active Interventions: Assess ulceration(s) every visit Treatment Activities: Skin care regimen initiated : 07/04/2021 Notes: Electronic Signature(s) Signed: 09/30/2021 5:14:23 PM By: Adline Peals Entered By: Adline Peals on 09/30/2021 07:45:51 -------------------------------------------------------------------------------- Pain Assessment Details Patient Name: Date of Service: [name], [name] 09/30/2021 7:45 A M Medical Record Number: [id number] Patient Account Number: [id number] Date of Birth/Sex: Treating RN: September 11, 1970 (51 y.o. [name] Primary Care Makenze Ellett: [name] Other Clinician: Referring Regino Fournet: Treating Kimm Sider/Extender: [name]2 Active Problems Location of Pain Severity and Description of Pain Patient Has Paino No Site Locations Rate the pain. Current Pain Level: 0 Pain Management and Medication Current Pain Management: Electronic Signature(s) Signed: 09/30/2021 5:14:23 PM By: Adline Peals Entered By: Adline Peals on 09/30/2021 07:37:39 -------------------------------------------------------------------------------- Patient/Caregiver Education Details Patient Name: Date of Service: [name], [name] 9/11/2023andnbsp7:45 A M Medical Record Number: [id number] Patient Account Number: [id number] Date of Birth/Gender: Treating RN: Nov 20, 1970 (51 y.o. [name] Primary Care Physician: [name] Other Clinician: Referring Physician: Treating Physician/Extender: [name]2 Education Assessment Education Provided To: Patient Education Topics Provided Wound/Skin Impairment: Methods: Explain/Verbal Responses: Reinforcements needed, State content  correctly [employer]) Signed: 09/30/2021 5:14:23 PM By: Adline Peals Entered By: Adline Peals on 09/30/2021 07:46:01 -------------------------------------------------------------------------------- Wound Assessment  Details Patient Name: Date of Service: [name], [name] 09/30/2021 7:45 A M Medical Record Number: [id number] Patient Account Number: [id number] Date of Birth/Sex: Treating RN: 1971-01-16 (51 y.o. [name] Primary Care Brilee Port: [name] Other Clinician: Referring Veneda Kirksey: Treating Destry Dauber/Extender: [name]2 Wound Status Wound Number: 2 Primary Etiology: Diabetic Wound/Ulcer of the Lower Extremity Wound Location: Right, Lateral, Plantar Foot Wound Status: Open Wounding Event: Gradually Appeared Comorbid History: Type II Diabetes Date Acquired: 12/19/2016 Weeks Of Treatment: 12 Clustered Wound: No Photos Wound Measurements Length: (cm) 0.1 Width: (cm) 1.5 Depth: (cm) 1.2 Area: (cm) 0.118 Volume: (cm) 0.141 % Reduction [name] Area: 96.7% % Reduction [name] Volume: 99.2% Epithelialization: Small (1-33%) Tunneling: No Undermining: No Wound Description Classification: Grade 3 Wound Margin: Thickened Exudate Amount: Medium Exudate Type: Serosanguineous Exudate Color: red, brown Foul Odor After Cleansing: No Slough/Fibrino Yes Wound Bed Granulation Amount: Large (67-100%) Exposed Structure Granulation Quality: Pink Fascia Exposed: No Necrotic Amount: Small (1-33%) Fat Layer (Subcutaneous Tissue) Exposed: Yes Necrotic Quality: Adherent Slough Tendon Exposed: No Muscle Exposed: No Joint Exposed: No Bone Exposed: No Treatment Notes Wound #2 (Foot) Wound Laterality: Plantar, Right, Lateral Cleanser Soap and Water Discharge Instruction: May shower and wash wound with dial antibacterial soap and water prior to dressing change. Wound Cleanser Discharge Instruction:  Cleanse the wound with wound cleanser prior to applying a clean dressing using gauze sponges, not tissue or cotton balls. Peri-Wound Care cotton tipped applicators Topical Gentamicin Discharge Instruction: As directed by physician Primary Dressing KerraCel Ag Gelling Fiber Dressing, 2x2 [name] (silver alginate) Discharge Instruction: Apply silver alginate to wound bed as instructed Secondary Dressing Secured With Paper Tape, 2x10 ([name]/yd) Discharge Instruction: Secure dressing with tape as directed. Compression Wrap Kerlix Roll 4.5x3.1 ([name]/yd) Discharge Instruction: Apply Kerlix and Coban compression as directed. Compression Stockings Add-Ons Electronic Signature(s) Signed: 09/30/2021 5:14:23 PM By: Adline Peals Entered By: Adline Peals on 09/30/2021 07:42:42 -------------------------------------------------------------------------------- Vitals Details Patient Name: Date of Service: [name], [name] 09/30/2021 7:45 A M Medical Record Number: [id number] Patient Account Number: [id number] Date of Birth/Sex: Treating RN: December 09, 1970 (51 y.o. [name] Primary Care Verl Kitson: [name] Other Clinician: Referring Andrya Roppolo: Treating Minda Faas/Extender: [name]2 Vital Signs Time Taken: 07:37 Temperature (F): 98.8 Height ([name]: 72 Pulse (bpm): 92 Weight (lbs): 312 Respiratory Rate (breaths/min): 18 Body Mass Index (BMI): 42.3 Blood Pressure (mmHg): 144/83 Reference Range: 80 - 120 mg / dl Electronic Signature(s) Signed: 09/30/2021 5:14:23 PM By: Adline Peals Entered By: Adline Peals on 09/30/2021 07:37:34

## 2021-09-30 NOTE — Progress Notes (Signed)
[name], [name] ([id number]) Visit Report for 09/30/2021 Chief Complaint Document Details Patient Name: Date of Service: [name], [name] 09/30/2021 7:45 A M Medical Record Number: [id number] Patient Account Number: [id number] Date of Birth/Sex: Treating [name]: 01-22-1970 (51 y.o. M) Primary Care Provider: Garret [name] Other Clinician: Referring Provider: Treating Provider/Extender: [name] in Treatment: 12 Information Obtained from: Patient Chief Complaint Patients presents for treatment of an open diabetic ulcer Electronic Signature(s) Signed: 09/30/2021 7:51:59 AM By: [name] Maudlin MD FACS Entered By: [name] on 09/30/2021 07:51:59 -------------------------------------------------------------------------------- Debridement Details Patient Name: Date of Service: [name], [name] 09/30/2021 7:45 A M Medical Record Number: [id number] Patient Account Number: [id number] Date of Birth/Sex: Treating [name]: 26-Oct-1970 [name] Primary Care Provider: Garret [name] Other Clinician: Referring Provider: Treating Provider/Extender: [name] in Treatment: 12 Debridement Performed for Assessment: Wound #2 Right,Lateral,Plantar Foot Performed By: Physician [name] Maudlin, MD Debridement Type: Debridement Severity of Tissue Pre Debridement: Fat layer exposed Level of Consciousness (Pre-procedure): Awake and Alert Pre-procedure Verification/Time Out Yes - 07:46 Taken: Start Time: 07:46 Pain Control: Lidocaine 4% T opical Solution T Area Debrided (L x W): otal 0.1 (cm) x 1.5 (cm) = 0.15 (cm) Tissue and other material debrided: Non-Viable, Callus, Skin: Epidermis Level: Skin/Epidermis Debridement Description: Selective/Open Wound Instrument: Curette Bleeding: Minimum Hemostasis Achieved: Pressure Procedural Pain: 0 Post Procedural Pain: 0 Response to Treatment: Procedure was tolerated  well Level of Consciousness (Post- Awake and Alert procedure): Post Debridement Measurements of Total Wound Length: (cm) 0.1 Width: (cm) 1.5 Depth: (cm) 1.2 Volume: (cm) 0.141 Character of Wound/Ulcer Post Debridement: Improved Severity of Tissue Post Debridement: Fat layer exposed Post Procedure Diagnosis Same as Pre-procedure Notes scribed for Dr. Celine [name] by [name] Electronic Signature(s) Signed: 09/30/2021 1:25:30 PM By: [name] Maudlin MD FACS Signed: 09/30/2021 5:14:23 PM By: [name] Previous Signature: 09/30/2021 8:20:12 AM Version By: [name] Maudlin MD FACS Entered By: [name] on 09/30/2021 12:53:51 -------------------------------------------------------------------------------- HPI Details Patient Name: Date of Service: [name], [name] 09/30/2021 7:45 A M Medical Record Number: [id number] Patient Account Number: [id number] Date of Birth/Sex: Treating [name]: 11-Jul-1970 (51 y.o. 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 The most recent amputation was in August 2022. At that operation, antibiotic beads were placed in the wound. He has been managed by podiatry for his procedures and management of his wounds. He has been in a Water engineer. He is on oral doxycycline. They have been using Betadine wet to dry dressings along with Iodosorb. The patient states that when he thinks the wound is getting too dry, he applies topical Neosporin. At his last visit, on June 7 of this year, the podiatrist determined that he felt the wound was stalled and referred him to wound care for  additional evaluation and management. An MRI has been ordered, but not yet scheduled or performed. Pathology from his operation in August 2022 demonstrated findings consistent with chronic osteomyelitis. Today, there is a large irregular wound on the plantar surface of his right foot, at about the level of the fifth metatarsal head. This tracks through to a pinpoint opening on the dorsal lateral portion  of his foot. The intake nurse reported purulent drainage. There is some malodor from the wound. No frank necrosis identified. 07/11/2021: Today, the wounds do not connect. I attempted multiple times from various directions and the shared tunnel is no longer open. He has some slough accumulation on the dorsal part of his foot as well as slough and callus buildup on the plantar surface. His MRI is scheduled for June 29. No purulent drainage or malodor appreciated today. 07/19/2021: The lateral foot wound has closed and there is no tunnel connecting the plantar foot wound to that site. The plantar foot wound still probes quite deeply, however. There is some slough, eschar, and nonviable tissue accumulated in the wound bed. No malodorous drainage present. His MRI was performed yesterday and is consistent with fairly extensive osteomyelitis. 07/29/2021: The patient has an appointment with infectious disease on July 18 to treat his osteomyelitis. The plantar wound still probes quite deeply, approaching bone. The orifice has narrowed quite substantially, however, making it more difficult for him to pack. 08/05/2021: The tunnel connecting the lateral foot wound to the plantar foot wound has reopened. He sees infectious disease tomorrow to discuss long-term antibiotic treatment for osteomyelitis. There was a bit of murky drainage in the wound, but this was noted after he had had topical lidocaine applied so may have just been a blob of the anesthetic. No odor or frank purulent drainage. The wound probes deeply at  the midfoot approaching bone. He does have MRI results confirming his diagnosis of osteomyelitis. He has failed to progress with conventional treatment and I think his best chance for preservation of the foot is to initiate hyperbaric oxygen therapy. 08/13/2021: The tunnel connecting the 2 wounds has closed again. He has a PICC line and is getting IV daptomycin and cefepime. EKG and chest x-ray are within normal limits. He is scheduled to start hyperbaric oxygen therapy tomorrow. The wound in his midfoot probes deeply, approaching bone. The skin at the orifice continues to heaped up and threatens to close over despite the large cavity within. No erythema, induration, or purulent drainage. The wound on his lateral foot is fairly small and quite clean. 08/19/2021: The lateral foot wound has nearly closed. The wound in his midfoot does not probe quite as deeply today. The skin at the orifice continues to try to roll in and obscure the opening. No frankly necrotic tissue appreciated. He is tolerating hyperbaric oxygen therapy. 08/26/2021: The lateral foot wound has closed completely. The wound in his midfoot is shallower again today. The wound orifice is contracting. We are using gentamicin and silver alginate. He continues on IV daptomycin and cefepime and is tolerating hyperbaric oxygen therapy without difficulty. 09/02/2021: His foot is a little bit sore today but he was up walking on it all weekend doing back to school shopping with his daughter. The tunneling is down to about 3 cm and I do not appreciate bone at the tip of the probe. The wound is clean but has some callus creating an overhanging lip at the distal aspect. He continues to receive hyperbaric oxygen therapy as well as IV daptomycin and cefepime. 09/09/2021: His wound continues to improve. The tunnel has come in by 0.9 cm. He continues to form callus around the orifice of the wound. He is tolerating hyperbaric oxygen therapy along with his IV  antibiotics. 09/16/2021: The tunneling is down to just half a centimeter. He continues to build up callus around the orifice of the wound. He completed his course  of IV antibiotics and his PICC line is scheduled to be removed this Friday. He is tolerating hyperbaric oxygen without difficulty. We have been applying topical gentamicin and silver alginate to his wound. 09/24/2021: The wound depth has come in even further. There is a little bit of callus buildup around the orifice. The opening is quite narrow, at this point. 09/30/2021: The wound depth is about the same this week. The opening continues to contract with callus accumulation. There is some discoloration of the skin on the lateral part of his foot and he admitted to walking more than usual this weekend and also wearing a different pair shoes. He continues to tolerate hyperbaric oxygen therapy without difficulty. Electronic Signature(s) Signed: 09/30/2021 7:52:55 AM By: [name] Maudlin MD FACS Entered By: [name] on 09/30/2021 07:52:55 -------------------------------------------------------------------------------- Physical Exam Details Patient Name: Date of Service: [name], [name] 09/30/2021 7:45 A M Medical Record Number: [id number] Patient Account Number: [id number] Date of Birth/Sex: Treating [name]: [birth year] (50 y.o. M) Primary Care Provider: Garret [name] Other Clinician: Referring Provider: Treating Provider/Extender: [name] in Treatment: 12 Constitutional Slightly hypertensive. . . . No acute distress.Marland Kitchen Respiratory .Marland Kitchen Cardiovascular . Notes 09/30/2021: The wound depth is about the same this week. The opening continues to contract with callus accumulation. There is some discoloration of the skin on the lateral part of his foot and he admitted to walking more than usual this weekend and also wearing a different pair of shoes. Electronic Signature(s) Signed: 09/30/2021 7:53:38 AM By:  [name] Maudlin MD FACS Entered By: [name] on 09/30/2021 07:53:38 -------------------------------------------------------------------------------- Physician Orders Details Patient Name: Date of Service: [name], [name] 09/30/2021 7:45 A M Medical Record Number: [id number] Patient Account Number: [id number] Date of Birth/Sex: Treating [name]: 04-30-1970 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 - Dr [name] Room 2 - 9/18 at 7:45 AM Return A Anesthetic Wound #2 [place] (In clinic) Topical Lidocaine 4% applied to wound bed Bathing/ Shower/ Hygiene Other Bathing/Shower/Hygiene Orders/Instructions: - Change dressing after bathing Off-Loading Other: - Try to not stand on your feet too much Hyperbaric Oxygen Therapy Evaluate for HBO Therapy Indication: - wagner grade 3 diabetic foot ulcer right foot 2.0 ATA for 90 Minutes without A Breaks ir Total Number of Treatments: - 40 more treatments for a total of 80 09/30/21 One treatments per day (delivered Monday through Friday unless otherwise specified in Special Instructions below): Finger stick Blood Glucose Pre- and Post- HBOT Treatment. Follow Hyperbaric Oxygen Glycemia Protocol Afrin (Oxymetazoline HCL) 0.05% nasal spray - 1 spray in both nostrils daily as needed prior to HBO treatment for difficulty clearing ears Wound Treatment Wound #2 - Foot Wound Laterality: Plantar, Right, Lateral Cleanser: Soap and Water Every Other  Day/15 Days Discharge Instructions: May shower and wash wound with dial antibacterial soap and water prior to dressing change. Cleanser: Wound Cleanser (Generic) Every Other Day/15 Days Discharge Instructions: Cleanse the wound with wound cleanser prior to applying a clean dressing using gauze sponges, not tissue or cotton balls. Peri-Wound Care: cotton tipped applicators (Generic) Every Other Day/15 Days Topical: Gentamicin Every  Other Day/15 Days Discharge Instructions: As directed by physician Prim Dressing: KerraCel Ag Gelling Fiber Dressing, 2x2 in (silver alginate) Every Other Day/15 Days ary Discharge Instructions: Apply silver alginate to wound bed as instructed Secured With: Paper Tape, 2x10 (in/yd) (Generic) Every Other Day/15 Days Discharge Instructions: Secure dressing with tape as directed. Compression Wrap: Kerlix Roll 4.5x3.1 (in/yd) (Generic) Every Other Day/15 Days Discharge Instructions: Apply Kerlix and Coban compression as directed. Patient Medications llergies: No Known Allergies A Notifications Medication Indication Start End 09/30/2021 lidocaine DOSE topical 4 % cream - cream topical GLYCEMIA INTERVENTIONS PROTOCOL PRE-HBO GLYCEMIA INTERVENTIONS ACTION INTERVENTION Obtain pre-HBO capillary blood glucose (ensure 1 physician order is in chart). A. Notify HBO physician and await physician orders. 2 If result is 70 mg/dl or below: B. If the result meets the hospital definition of a critical result, follow hospital policy. A. Give patient an 8 ounce Glucerna Shake, an 8 ounce Ensure, or 8 ounces of a Glucerna/Ensure equivalent dietary supplement*. B. Wait 30 minutes. If result is 71 mg/dl to 130 mg/dl: C. Retest patients capillary blood glucose (CBG). D. If result greater than or equal to 110 mg/dl, proceed with HBO. If result less than 110 mg/dl, notify HBO physician and consider holding HBO. If result is 131 mg/dl to 249 mg/dl: A. Proceed with HBO. A.  Notify HBO physician and await physician orders. B. It is recommended to hold HBO and do If result is 250 mg/dl or greater: blood/urine ketone testing. C. If the result meets the hospital definition of a critical result, follow hospital policy. POST-HBO GLYCEMIA INTERVENTIONS ACTION INTERVENTION Obtain post HBO capillary blood glucose (ensure 1 physician order is in chart). A. Notify HBO physician and await physician orders. 2 If result is 70 mg/dl or below: B. If the result meets the hospital definition of a critical result, follow hospital policy. A. Give patient an 8 ounce Glucerna Shake, an 8 ounce Ensure, or 8 ounces of a Glucerna/Ensure equivalent dietary supplement*. B. Wait 15 minutes for symptoms of If result is 71 mg/dl to 100 mg/dl: hypoglycemia (i.e. nervousness, anxiety, sweating, chills, clamminess, irritability, confusion, tachycardia or dizziness). C. If patient asymptomatic, discharge patient. If patient symptomatic, repeat capillary blood glucose (CBG) and notify HBO physician. If result is 101 mg/dl to 249 mg/dl: A. Discharge patient. A. Notify HBO physician and await physician orders. B. It is recommended to do blood/urine ketone If result is 250 mg/dl or greater: testing. C. If the result meets the hospital definition of a critical result, follow hospital policy. *Juice or candies are NOT equivalent products. If patient refuses the Glucerna or Ensure, please consult the hospital dietitian for an appropriate substitute. Electronic Signature(s) Signed: 09/30/2021 8:20:12 AM By: [name] Maudlin MD FACS Entered By: [name] on 09/30/2021 07:53:47 -------------------------------------------------------------------------------- Problem List Details Patient Name: Date of Service: [name], [name] 09/30/2021 7:45 A M Medical Record Number: [id number] Patient Account Number: [id number] Date of Birth/Sex: Treating [name]: 12/18/1970 (51 y.o.  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 09/30/2021 7:45 A M Medical Record Number: [id number] Patient Account Number: [id number] Date of Birth/Sex: Treating [name]: [birth year] (52 y.o. 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 The most recent amputation was in August 2022. At that operation, antibiotic beads were placed in the wound. He has been managed by podiatry for his procedures and management of his wounds. He has been in a Water engineer. He is on oral doxycycline. They have been using Betadine wet to dry dressings along with Iodosorb. The patient states that when he thinks the wound is getting too dry, he applies topical  Neosporin. At his last visit, on June 7 of this year, the podiatrist determined that he felt the wound was stalled and referred him to wound care for additional evaluation and management. An MRI has been ordered, but not yet scheduled or performed. Pathology from his operation in August 2022 demonstrated findings consistent with chronic osteomyelitis. Today, there is a large irregular wound on the plantar surface of his right foot, at about the level of the fifth metatarsal head. This tracks through to a pinpoint opening on the dorsal lateral portion of his foot. The intake nurse reported purulent drainage. There is some malodor from the wound. No frank necrosis identified. 07/11/2021: Today, the wounds do not connect. I attempted multiple times from various directions and the shared tunnel is no longer open. He has some slough accumulation on the dorsal part of his foot as well as slough and callus buildup on the plantar surface. His MRI is scheduled for June 29. No purulent drainage or malodor appreciated today. 07/19/2021: The lateral foot wound has closed and there is no tunnel connecting the plantar foot wound to that site. The plantar foot wound still probes quite deeply, however. There is some slough, eschar, and nonviable tissue accumulated in the wound bed. No malodorous drainage present. His MRI was performed yesterday and is consistent with fairly extensive osteomyelitis. 07/29/2021: The patient has an appointment with infectious disease on July 18 to treat his osteomyelitis. The plantar wound still probes quite deeply, approaching bone. The orifice has narrowed quite substantially, however, making it more difficult for him to pack. 08/05/2021: The tunnel connecting the lateral foot wound to the plantar foot wound has reopened. He sees infectious disease tomorrow to discuss long-term antibiotic treatment for osteomyelitis. There was a bit of murky drainage in the wound, but this was noted  after he had had topical lidocaine applied so may have just been a blob of the anesthetic. No odor or frank purulent drainage. The wound probes deeply at the midfoot approaching bone. He does have MRI results confirming his diagnosis of osteomyelitis. He has failed to progress with conventional treatment and I think his best chance for preservation of the foot is to initiate hyperbaric oxygen therapy. 08/13/2021: The tunnel connecting the 2 wounds has closed again. He has a PICC line and is getting IV daptomycin and cefepime. EKG and chest x-ray are within normal limits. He is scheduled to start hyperbaric oxygen therapy  tomorrow. The wound in his midfoot probes deeply, approaching bone. The skin at the orifice continues to heaped up and threatens to close over despite the large cavity within. No erythema, induration, or purulent drainage. The wound on his lateral foot is fairly small and quite clean. 08/19/2021: The lateral foot wound has nearly closed. The wound in his midfoot does not probe quite as deeply today. The skin at the orifice continues to try to roll in and obscure the opening. No frankly necrotic tissue appreciated. He is tolerating hyperbaric oxygen therapy. 08/26/2021: The lateral foot wound has closed completely. The wound in his midfoot is shallower again today. The wound orifice is contracting. We are using gentamicin and silver alginate. He continues on IV daptomycin and cefepime and is tolerating hyperbaric oxygen therapy without difficulty. 09/02/2021: His foot is a little bit sore today but he was up walking on it all weekend doing back to school shopping with his daughter. The tunneling is down to about 3 cm and I do not appreciate bone at the tip of the probe. The wound is clean but has some callus creating an overhanging lip at the distal aspect. He continues to receive hyperbaric oxygen therapy as well as IV daptomycin and cefepime. 09/09/2021: His wound continues to improve. The  tunnel has come in by 0.9 cm. He continues to form callus around the orifice of the wound. He is tolerating hyperbaric oxygen therapy along with his IV antibiotics. 09/16/2021: The tunneling is down to just half a centimeter. He continues to build up callus around the orifice of the wound. He completed his course of IV antibiotics and his PICC line is scheduled to be removed this Friday. He is tolerating hyperbaric oxygen without difficulty. We have been applying topical gentamicin and silver alginate to his wound. 09/24/2021: The wound depth has come in even further. There is a little bit of callus buildup around the orifice. The opening is quite narrow, at this point. 09/30/2021: The wound depth is about the same this week. The opening continues to contract with callus accumulation. There is some discoloration of the skin on the lateral part of his foot and he admitted to walking more than usual this weekend and also wearing a different pair shoes. He continues to tolerate hyperbaric oxygen therapy without difficulty. Patient History Information obtained from Patient. Social History Never smoker, Marital Status - Married, Alcohol Use - Never, Drug Use - No History, Caffeine Use - Daily - T coffee. ea; Medical History Endocrine Patient has history of Type II Diabetes Hospitalization/Surgery History - Amuptation of 3rd,4th and 5th toes of Right foot;Oral Surgery;Anal Fissure surgery; Cholecystectomy. Objective Constitutional Slightly hypertensive. No acute distress.. Vitals Time Taken: 7:37 AM, Height: 72 in, Weight: 312 lbs, BMI: 42.3, Temperature: 98.8 F, Pulse: 92 bpm, Respiratory Rate: 18 breaths/min, Blood Pressure: 144/83 mmHg. General Notes: 09/30/2021: The wound depth is about the same this week. The opening continues to contract with callus accumulation. There is some discoloration of the skin on the lateral part of his foot and he admitted to walking more than usual this weekend and  also wearing a different pair of shoes. Integumentary (Hair, Skin) Wound #2 status is Open. Original cause of wound was Gradually Appeared. The date acquired was: 12/19/2016. The wound has been in treatment 12 weeks. The wound is located on the [place]. The wound measures 0.1cm length x 1.5cm width x 1.2cm depth; 0.118cm^2 area and 0.141cm^3 volume. There is Fat Layer (Subcutaneous Tissue) exposed. There is  no tunneling or undermining noted. There is a medium amount of serosanguineous drainage noted. The wound margin is thickened. There is large (67-100%) pink granulation within the wound bed. There is a small (1-33%) amount of necrotic tissue within the wound bed including Adherent Slough. 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 There was a Selective/Open Wound Skin/Epidermis Debridement with a total area of 0.15 sq cm performed by [name] Maudlin, MD. With the following instrument(s): Curette to remove Non-Viable tissue/material. Material removed includes Callus and Skin: Epidermis and after achieving pain control using Lidocaine 4% Topical Solution. No specimens were taken. A time out was conducted at 07:46, prior to the start of the procedure. A Minimum amount of bleeding was controlled with Pressure. The procedure was tolerated well with a pain level of 0 throughout and a pain level of 0 following the procedure. Post Debridement Measurements: 0.1cm length x 1.5cm width x 1.2cm depth; 0.141cm^3 volume. Character of Wound/Ulcer Post Debridement is improved. Severity of Tissue Post  Debridement is: Fat layer exposed. Post procedure Diagnosis Wound #2: Same as Pre-Procedure Plan Follow-up Appointments: Return Appointment in 1 week. 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 Follow Hyperbaric Oxygen Glycemia Protocol Afrin (Oxymetazoline HCL) 0.05% nasal spray - 1 spray in both nostrils daily as needed prior to HBO treatment for difficulty clearing ears The following medication(s) was prescribed: lidocaine topical 4 % cream cream topical was prescribed at facility WOUND #2: - Foot Wound Laterality: Plantar, Right, Lateral Cleanser: Soap and Water Every Other Day/15 Days Discharge Instructions: May shower and wash wound with dial antibacterial soap and water prior to dressing change. Cleanser: Wound Cleanser (Generic) Every Other Day/15 Days Discharge Instructions: Cleanse the wound with wound cleanser prior to applying a clean dressing using gauze sponges, not tissue or cotton balls. Peri-Wound Care: cotton tipped applicators (Generic) Every Other Day/15 Days Topical: Gentamicin Every Other Day/15 Days Discharge Instructions: As directed by physician Prim Dressing: KerraCel Ag Gelling Fiber Dressing, 2x2 in (silver alginate) Every Other Day/15 Days ary Discharge Instructions: Apply silver alginate to wound  bed as instructed Secured With: Paper T ape, 2x10 (in/yd) (Generic) Every Other Day/15 Days Discharge Instructions: Secure dressing with tape as directed. Com pression Wrap: Kerlix Roll 4.5x3.1 (in/yd) (Generic) Every Other Day/15 Days Discharge Instructions: Apply Kerlix and Coban compression as directed. 09/30/2021: The wound depth is about the same this week. The opening continues to contract with callus accumulation. There is some discoloration of the skin on the lateral part of his foot and he admitted to walking more than usual this weekend and  also wearing a different pair of shoes. I used a curette to debride callus and skin from the orifice of the wound in order to facilitate continued packing. I cautioned him to minimize his walking or to wear the shoes that have not been causing any friction/pressure on his lateral foot. We will continue gentamicin with silver alginate packing. We will prescribe an additional 40 treatments of hyperbaric oxygen therapy. Follow-up in 1 week. Electronic Signature(s) Signed: 09/30/2021 7:54:36 AM By: [name] Maudlin MD FACS Entered By: [name] on 09/30/2021 07:54:36 -------------------------------------------------------------------------------- HxROS Details Patient Name: Date of Service: [name], [name] 09/30/2021 7:45 A M Medical Record Number: [id number] Patient Account Number: [id number] Date of Birth/Sex: Treating [name]: 20-Mar-1970 (51 y.o. M) Primary Care Provider: Garret [name] Other Clinician: Referring Provider: Treating Provider/Extender: [name] in Treatment: 12 Information Obtained From Patient Endocrine Medical History: Positive for: Type II Diabetes Immunizations Pneumococcal Vaccine: Received Pneumococcal Vaccination: Yes Received Pneumococcal Vaccination On or After 60th Birthday: No Implantable Devices None Hospitalization / Surgery History Type of Hospitalization/Surgery Amuptation  of 3rd,4th and 5th toes of Right foot;Oral Surgery;Anal Fissure surgery; Cholecystectomy Family and Social History Never smoker; Marital Status - Married; Alcohol Use: Never; Drug Use: No History; Caffeine Use: Daily - T coffee; Financial Concerns: No; Food, ea; Clothing or Shelter Needs: No; Support System Lacking: No; Transportation Concerns: No Electronic Signature(s) Signed: 09/30/2021 8:20:12 AM By: [name] Maudlin MD FACS Entered By: [name] on 09/30/2021 07:53:00 -------------------------------------------------------------------------------- SuperBill Details Patient Name: Date of Service: [name], [name] 09/30/2021 Medical Record Number: [id number] Patient Account Number: [id number] Date of Birth/Sex: Treating [name]: 1970/11/22 (51 y.o. 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 V95.638 Other chronic osteomyelitis, right ankle and foot L97.514 Non-pressure chronic ulcer of other part of right foot with necrosis of bone Facility Procedures CPT4 Code: 75643329 Description: [id number] - DEBRIDE WOUND 1ST 20 SQ CM OR < ICD-10 Diagnosis Description L97.514 Non-pressure chronic ulcer of other part of right foot with necrosis of bone Modifier: Quantity: 1 Physician Procedures : CPT4 Code Description Modifier [id number] 16010 - WC PHYS LEVEL 4 - EST PT 25 ICD-10 Diagnosis Description L97.514 Non-pressure chronic ulcer of other part of right foot with necrosis of bone M86.671 Other chronic osteomyelitis, right ankle and foot  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

## 2021-10-01 ENCOUNTER — Other Ambulatory Visit: Payer: Self-pay

## 2021-10-01 ENCOUNTER — Encounter: Payer: Self-pay | Admitting: Internal Medicine

## 2021-10-01 ENCOUNTER — Ambulatory Visit (INDEPENDENT_AMBULATORY_CARE_PROVIDER_SITE_OTHER): Payer: BLUE CROSS/BLUE SHIELD | Admitting: Internal Medicine

## 2021-10-01 ENCOUNTER — Encounter (HOSPITAL_BASED_OUTPATIENT_CLINIC_OR_DEPARTMENT_OTHER): Payer: BLUE CROSS/BLUE SHIELD | Admitting: Internal Medicine

## 2021-10-01 VITALS — BP 150/90 | HR 80 | Temp 98.1°F | Ht 72.0 in | Wt 314.0 lb

## 2021-10-01 DIAGNOSIS — E1149 Type 2 diabetes mellitus with other diabetic neurological complication: Secondary | ICD-10-CM | POA: Diagnosis not present

## 2021-10-01 DIAGNOSIS — M86171 Other acute osteomyelitis, right ankle and foot: Secondary | ICD-10-CM | POA: Diagnosis not present

## 2021-10-01 DIAGNOSIS — L97514 Non-pressure chronic ulcer of other part of right foot with necrosis of bone: Secondary | ICD-10-CM | POA: Diagnosis not present

## 2021-10-01 DIAGNOSIS — M86671 Other chronic osteomyelitis, right ankle and foot: Secondary | ICD-10-CM

## 2021-10-01 DIAGNOSIS — E1169 Type 2 diabetes mellitus with other specified complication: Secondary | ICD-10-CM | POA: Diagnosis not present

## 2021-10-01 LAB — GLUCOSE, CAPILLARY
Glucose-Capillary: 158 mg/dL — ABNORMAL HIGH (ref 70–99)
Glucose-Capillary: 131 mg/dL — ABNORMAL HIGH (ref 70–99)

## 2021-10-01 NOTE — Progress Notes (Signed)
[name], Trampus M. ([id number]) Visit Report for 09/30/2021 Problem List Details Patient Name: Date of Service: [name], [name] 09/30/2021 8:30 A M Medical Record Number: [id number] Patient Account Number: [id number] Date of Birth/Sex: Treating RN: 1970/09/14 (51 y.o. 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 09/30/2021 Medical Record Number: [id number] Patient Account Number: [id number] Date of Birth/Sex: Treating RN: [birth year] (51 y.o. 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 LV76.160Non-pressure chronic ulcer of other part of right foot with necrosis  of bone M86.671 Other chronic osteomyelitis, right ankle and foot Modifier: Quantity: 4 Physician Procedures : CPT4 Code Description Modifier [id number] 69485- WC PHYS HYPERBARIC OXYGEN THERAPY ICD-10 Diagnosis Description 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

## 2021-10-01 NOTE — Progress Notes (Addendum)
[name], [name] ([id number]) Visit Report for 10/01/2021 HBO Details Patient Name: Date of Service: [name], [name] 10/01/2021 8:00 A M Medical Record Number: [id number] Patient Account Number: [id number] Date of Birth/Sex: Treating RN: [birth year] [name] Primary Care Jamari Diana: [name] Other Clinician: [name] Referring [name]: Treating Viviann Broyles/Extender: [name] in Treatment: 12 HBO Treatment Course Details Treatment Course Number: 1 Ordering Theron Cumbie: [name] T Treatments Ordered: otal 40 HBO Treatment Start Date: 08/14/2021 HBO Indication: Diabetic Ulcer(s) of the Lower Extremity Standard/Conservative Wound Care tried and failed greater than or equal to 30 days Wound #2 Right, Lateral, Plantar Foot , Wound #3R Right, Lateral Foot HBO Treatment Details Treatment Number: 34 Patient Type: Outpatient Chamber Type: Monoplace Chamber Serial #: [id number] Treatment Protocol: 2.0 ATA with 90 minutes oxygen, and no air breaks Treatment Details Compression Rate Down: 2.0 psi / minute De-Compression Rate Up: 2.0 psi / minute Air breaks and breathing Decompress Decompress Compress Tx Pressure Begins Reached periods Begins Ends (leave unused spaces blank) Chamber Pressure (ATA 1 2 ------2 1 ) Clock Time (24 hr) 08:22 08:30 - - - - - - 10:00 10:08 Treatment Length: 106 (minutes) Treatment Segments: 4 Vital Signs Capillary Blood Glucose Reference Range: 80 - 120 mg / dl HBO Diabetic Blood Glucose Intervention Range: <131 mg/dl or >249 mg/dl Time Vitals Blood Respiratory Capillary Blood Glucose Pulse Action Type: Pulse: Temperature: Taken: Pressure: Rate: Glucose (mg/dl): Meter #: Oximetry (%) Taken: Pre 08:04 132/91 85 16 97.6 158 Post 10:10 135/90 76 16 97.8 131 Treatment Response Treatment Toleration: Well Treatment Completion Status: Treatment Completed without Adverse Event Additional  Procedure Documentation Tissue Sevierity: Necrosis of bone Physician HBO Attestation: I certify that I supervised this HBO treatment in accordance with Medicare guidelines. A trained emergency response team is readily available per Yes hospital policies and procedures. Continue HBOT as ordered. Yes Electronic Signature(s) Signed: 10/01/2021 3:59:59 PM By: [name] DO Previous Signature: 10/01/2021 1:48:20 PM Version By: [name] EMT Entered By: [name] on 10/01/2021 15:40:35 -------------------------------------------------------------------------------- HBO Safety Checklist Details Patient Name: Date of Service: [name], [name] 10/01/2021 8:00 A M Medical Record Number: [id number] Patient Account Number: [id number] Date of Birth/Sex: Treating RN: 1971-01-02 [name] Primary Care Rashanda Magloire: [name] Other Clinician: [name] Referring Shadiyah Wernli: Treating Tannisha Kennington/Extender: [name] in Treatment: 12 HBO Safety Checklist Items Safety Checklist Consent Form Signed Patient voided / foley secured and emptied When did you last eato 0700 Last dose of injectable or oral agent 0655 Ostomy pouch emptied and vented if applicable NA All implantable devices assessed, documented and approved NA Intravenous access site secured and place NA Valuables secured Linens and cotton and cotton/polyester blend (less than 51% polyester) Personal oil-based products / skin lotions / body lotions removed Wigs or hairpieces removed NA Smoking or tobacco materials removed Books / newspapers / magazines / loose paper removed Cologne, aftershave, perfume and deodorant removed Jewelry removed (may wrap wedding band) NA Make-up removed NA Hair care products removed Battery operated devices (external) removed Heating patches and chemical warmers removed Titanium eyewear removed NA Nail polish cured greater than 10  hours NA Casting material cured greater than 10 hours NA Hearing aids removed NA Loose dentures or partials removed NA Prosthetics have been removed NA Patient demonstrates correct use of air break device (if applicable) Patient concerns have been addressed Patient grounding bracelet on and cord attached to chamber Specifics for  Inpatients (complete in addition to above) Medication sheet sent with patient NA Intravenous medications needed or due during therapy sent with patient NA Drainage tubes (e.g. nasogastric tube or chest tube secured and vented) NA Endotracheal or Tracheotomy tube secured NA Cuff deflated of air and inflated with saline NA Airway suctioned NA Notes The safety checklist was done before the treatment was started. Electronic Signature(s) Signed: 10/01/2021 1:47:00 PM By: [name] EMT Entered By: [name] on 10/01/2021 13:47:00

## 2021-10-01 NOTE — Assessment & Plan Note (Signed)
Discussed with patient at length today.  He is having an improved wound with ongoing wound care and prolonged course of antibiotics.  He has completed ~ 8 weeks of therapy at this point.  Discussed difficulty with eradicating this type of infection in the absence of definitive surgery.  Will repeat labs today and extend antibiotics an additional 2 weeks assuming labs are stable today.  Follow up again in 2 weeks with repeat labs and then follow up with me again in about 4 weeks.  If his labs remain stable today and in 2 weeks will plan to continue current regimen through next follow up with me which will ultimately be 12 weeks of therapy.  At that time, will need to discuss stopping antibiotics vs some form of suppression.  He wants to do what he can to salvage his limb so antibiotic regimen has been aggressive as well.

## 2021-10-01 NOTE — Progress Notes (Signed)
[name], [name] ([id number]) Visit Report for 10/01/2021 Problem List Details Patient Name: Date of Service: [name], [name] 10/01/2021 8:00 A M Medical Record Number: [id number] Patient Account Number: [id number] Date of Birth/Sex: Treating RN: 25-Jun-1970 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 10/01/2021 Medical Record Number: [id number] Patient Account Number: [id number] Date of Birth/Sex: Treating RN: 12-Apr-1970 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 LV89.381Non-pressure chronic ulcer of other part of right foot with necrosis  of bone M86.671 Other chronic osteomyelitis, right ankle and foot Modifier: Quantity: 4 Physician Procedures : CPT4 Code Description Modifier [id number] 58527- WC PHYS HYPERBARIC OXYGEN THERAPY ICD-10 Diagnosis Description 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

## 2021-10-01 NOTE — Progress Notes (Signed)
[name], Khiry M. ([id number]) Visit Report for 10/01/2021 Arrival Information Details Patient Name: Date of Service: [place]as[place],  [name] 10/01/2021 8:00 A M Medical Record Number: [id number] Patient Account Number: [id number] Date of Birth/Sex: Treating RN: Sep 09, 1970 (51 y.o. [name], [name] Primary Care Kelly Eisler: [name] Other Clinician: [name] Referring Rishabh Rinkenberger: Treating Shavar Gorka/Extender: [name] in Treatment: 12 Visit Information History Since Last Visit All ordered tests and consults were completed: Yes Patient Arrived: Ambulatory Added or deleted any medications: No Arrival Time: 07:37 Any new allergies or adverse reactions: No Accompanied By: None Had a fall or experienced change in No Transfer Assistance: None activities of daily living that may affect Patient Identification Verified: Yes risk of falls: Secondary Verification Process Completed: Yes Signs or symptoms of abuse/neglect since last visito No Patient Requires Transmission-Based Precautions: No Hospitalized since last visit: No Patient Has Alerts: Yes Implantable device outside of the clinic excluding No cellular tissue based products placed in the center since last visit: Pain Present Now: No Electronic Signature(s) Signed: 10/01/2021 1:45:02 PM By: [name] EMT Entered By: [name] on 10/01/2021 13:45:02 -------------------------------------------------------------------------------- Encounter Discharge Information Details Patient Name: Date of Service: [place], [name] 10/01/2021 8:00 A M Medical Record Number: [id number] Patient Account Number: [id number] Date of Birth/Sex: Treating RN: September 28, 1970 (50 y.o. [name] Primary Care Ronit Cranfield: [name] Other Clinician: [name] Referring Syndi Pua: Treating Srinidhi Landers/Extender: [name] in Treatment: 12 Encounter Discharge Information  Items Discharge Condition: Stable Ambulatory Status: Ambulatory Discharge Destination: Home Transportation: Private Auto Accompanied By: None Schedule Follow-up Appointment: Yes Clinical Summary of Care: Electronic Signature(s) Signed: 10/01/2021 1:51:10 PM By: [name] EMT Previous Signature: 10/01/2021 1:49:56 PM Version By: [name] EMT Entered By: [name] on 10/01/2021 13:51:10 -------------------------------------------------------------------------------- Vitals Details Patient Name: Date of Service: [name], [name] 10/01/2021 8:00 A M Medical Record Number: [id number] Patient Account Number: [id number] Date of Birth/Sex: Treating RN: 1970-11-16 (51 y.o. [name] Primary Care Clearnce Leja: [name] Other Clinician: [name] Referring Khaila Velarde: Treating Landen Knoedler/Extender: [name] in Treatment: 12 Vital Signs Time Taken: 08:04 Temperature (F): 97.6 Height (in): 72 Pulse (bpm): 85 Weight (lbs): 312 Respiratory Rate (breaths/min): 16 Body Mass Index (BMI): 42.3 Blood Pressure (mmHg): 132/91 Capillary Blood Glucose (mg/dl): 158 Reference Range: 80 - 120 mg / dl Electronic Signature(s) Signed: 10/01/2021 1:45:52 PM By: [name] EMT Entered By: [name] on 10/01/2021 13:45:52

## 2021-10-01 NOTE — Progress Notes (Signed)
[facility]  CHIEF COMPLAINT:    Follow up for right foot OM  SUBJECTIVE:    [name] is a 51 y.o. male with PMHx as below who presents to the clinic for right foot osteomyelitis.   Patient here today for routine follow up.  Last seen 09/05/21 for chronic diabetic foot ulcer complicated by worsened osteomyelitis after MRI on 07/18/21 showed fairly extensive involvement of his foot.  We discussed limb salvage vs amputation and he wanted to do what he could to save his limb.  PICC line placed 08/09/21 and started on Cefepime and Daptomycin per pharmacy recs which was completed on 09/20/21 (6 weeks). He has continued to follow closely with wound care. Notes from 9/11 appear to show that his wound depth is coming closer to the surface and size of wound is contracting.  He is continuing with HBO.  After his PICC line was removed on 09/20/21 he began a 2 week course of linezolid and levaquin x 14 days for "mop up" therapy.  He also had a visit with podiatry 2 weeks ago due to a blister/ulceration on his left great toe.  He is tolerating antibiotics without issues.  He is pleased with the progress his wound is making.   Please see A&P for the details of today's visit and status of the patient's medical problems.   Patient's Medications  New Prescriptions   No medications on file  Previous Medications   ASCORBIC ACID (VITAMIN C) 500 MG TABLET    Take 500 mg by mouth daily.   ASPIRIN EC 81 MG TABLET    Take 81 mg by mouth daily.   ATORVASTATIN (LIPITOR) 80 MG TABLET    Take 1 tablet (80 mg total) by mouth daily.   CYANOCOBALAMIN (VITAMIN B-12) 5000 MCG SUBL    Place 5,000 mcg under the tongue daily.   FENOFIBRATE (TRICOR) 48 MG TABLET    TAKE 1 TABLET BY MOUTH EVERY DAY   GLUCOSE BLOOD (ACCU-CHEK GUIDE) TEST STRIP    Dx E11.49 - Use to check blood glucose three times daily or as directed.   HYDROCHLOROTHIAZIDE (MICROZIDE) 12.5 MG CAPSULE    TAKE 1 CAPSULE  BY MOUTH EVERY DAY IN THE MORNING   INSULIN ASPART (NOVOLOG) 100 UNIT/ML FLEXPEN    Inject 15-25 Units into the skin 3 (three) times daily with meals. Please provide pen needles if possible   INSULIN DEGLUDEC (TRESIBA FLEXTOUCH) 200 UNIT/ML FLEXTOUCH PEN    Inject 80-90 Units into the skin daily.   INSULIN SYRINGE-NEEDLE U-100 (BD INSULIN SYRINGE U/F) 31G X 5/16" 0.5 ML MISC    Use to inject insulin 4 times daily. DX:E11.9   INSULIN SYRINGE-NEEDLE U-100 (BD INSULIN SYRINGE U/F) 31G X 5/16" 1 ML MISC    Use to inject tresiba/long acting insulin once daily   INSULIN SYRINGE-NEEDLE U-100 (BD VEO INSULIN SYR U/F 1/2UNIT) 31G X 15/64" 0.3 ML MISC    USE TO INJECT INSULIN FOUR TIMES DAILY AS DIRECTED   LANCETS (ONETOUCH ULTRASOFT) LANCETS    Use to test blood sugars daily. Dx: E11.9   LANCETS MISC    USED TO CHECK BLOOD GLUCOSE 3-4 TIMES DAILY   LEVOFLOXACIN (LEVAQUIN) 750 MG TABLET    Take 1 tablet (750 mg total) by mouth daily for 14 days.   LINEZOLID (ZYVOX) 600 MG TABLET    Take 1 tablet (600 mg total) by mouth 2 (two) times daily for 14 days.  MULTIPLE VITAMIN (MULTIVITAMIN WITH MINERALS) TABS TABLET    Take 1 tablet by mouth 2 (two) times daily.   OMEGA-3 FATTY ACIDS (FISH OIL) 1360 MG CAPS    Take 1,360 mg by mouth daily.    PYRIDOXINE (B-6) 100 MG TABLET    Take 100 mg by mouth 2 (two) times daily.   RAMIPRIL (ALTACE) 5 MG CAPSULE    TAKE 1 CAPSULE BY MOUTH EVERY DAY IN THE MORNING   SITAGLIPTIN (JANUVIA) 50 MG TABLET    Take 1 tablet (50 mg total) by mouth daily.  Modified Medications   No medications on file  Discontinued Medications   No medications on file      Past Medical History:  Diagnosis Date   Cellulitis and abscess of foot    05/ 2021 left   CKD (chronic kidney disease), stage III ([place]) 06-17-2019 per pt last visit 05-29-2016 (note in care everywhere)  currently followed by pcp   nephrologist-  dr [name] ([facility] in [place])     Diabetic peripheral  neuropathy ([place])    Elevated LFTs    followed by pcp   Hypertension    followed by pcp---- (06-17-2019 pt stated never had a stress test)   Iron deficiency anemia    Mixed hyperlipidemia    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/26/2001   Prolactinoma, 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   (06-17-2019 pt stated has been stable with no changes and followed by pcp)   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[place])    11-06-2016  lower extremity angiography (dr [name])  mil tibial disease w/ occlusion of the distal peroneal artery and dorsalis pedis artery but has widely patent posterior tibial and anterior tibial arterties   Type 2 diabetes mellitus treated with insulin ([name])    followed by pcp   (06-17-2019  pt stated checks blood sugar daily in am,  fasting sugar-- average 71)   Vitamin B 12 deficiency     Social History   Tobacco Use   Smoking status: Never   Smokeless tobacco: Never  Vaping Use   Vaping Use: Never used  Substance Use Topics   Alcohol use: No   Drug use: Never    Family History  Problem Relation Age of Onset   Sarcoidosis Mother    Stroke Father        early 83s. former smoker   Diabetes Father    Hypertension Father    Hypertension Sister    Diabetes Paternal Grandmother    Congenital heart disease Paternal Grandmother        died of complications   Healthy Daughter    Colon cancer Neg Hx    Colon polyps Neg Hx    Esophageal cancer Neg Hx    Rectal cancer Neg Hx    Stomach cancer Neg Hx     No Known Allergies  Review of Systems  All other systems reviewed and are negative.  Except as noted above.   OBJECTIVE:    Vitals:   10/01/21 1031  BP: (!) 150/90  Pulse: 80  Temp: 98.1 F (36.7 C)  TempSrc: Oral  SpO2: 99%  Weight: (!) 314 lb (142.4 kg)  Height: 6' (1.829 m)   Body mass index is 42.59 kg/m.  Physical Exam Constitutional:      Appearance: Normal appearance.  HENT:     Head: Normocephalic and atraumatic.  Pulmonary:     Effort: Pulmonary effort is normal. No  respiratory distress.  Musculoskeletal:     Comments:  Right foot plantar lateral ulcer with silver alginate packing wound.  Prior partial ray amputations noted.   Skin:    General: Skin is warm and dry.  Neurological:     Mental Status: He is alert.      Labs and Microbiology:    Latest Ref Rng & Units 08/06/2021    4:02 PM 05/15/2021    9:22 AM 10/16/2020   11:33 AM  CBC  WBC 3.8 - 10.8 Thousand/uL 9.4  9.4  7.6   Hemoglobin 13.2 - 17.1 g/dL 12.1  12.1  13.6   Hematocrit 38.5 - 50.0 % 38.5  36.7  42.5   Platelets 140 - 400 Thousand/uL 417  377.0  296       Latest Ref Rng & Units 08/06/2021    4:02 PM 05/15/2021    9:22 AM 11/22/2020    8:37 AM  CMP  Glucose 65 - 99 mg/dL 211  69  77   BUN 7 - 25 mg/dL _0 Creatinine 0.70 - 1.30 mg/dL 1.31  1.37  1.53   Sodium 135 - 146 mmol/L 136  140  139   Potassium 3.5 - 5.3 mmol/L 4.1  4.5  3.9   Chloride 98 - 110 mmol/L 101  103  104   CO2 20 - 32 mmol/L _1 Calcium 8.6 - 10.3 mg/dL 9.6  9.8  9.5   Total Protein 6.1 - 8.1 g/dL 8.2  8.3  7.8   Total Bilirubin 0.2 - 1.2 mg/dL 0.5  0.7  0.9   Alkaline Phos 39 - 117 U/L  84  64   AST 10 - 35 U/L _2 ALT 9 - 46 U/L 42  24  30      No results found for this or any previous visit (from the past 240 hour(s)).     ASSESSMENT & PLAN:    Osteomyelitis of ankle or foot, acute, right [facility]) Discussed with patient at length today.  He is having an improved wound with ongoing wound care and prolonged course of antibiotics.  He has completed ~ 8 weeks of therapy at this point.  Discussed difficulty with eradicating this type of infection in the absence of definitive surgery.  Will repeat labs today and extend antibiotics an additional 2 weeks assuming labs are stable today.  Follow up again in 2 weeks with repeat labs and then follow up with me again in about 4 weeks.  If his labs remain stable today and in 2 weeks will plan to continue current regimen through next follow  up with me which will ultimately be 12 weeks of therapy.  At that time, will need to discuss stopping antibiotics vs some form of suppression.  He wants to do what he can to salvage his limb so antibiotic regimen has been aggressive as well.    Orders Placed This Encounter  Procedures   Sedimentation rate   C-reactive protein   Comp Met (CMET)   CBC   CBC    Standing Status:   Future    Standing Expiration Date:   10/02/2022   Comp Met (CMET)    Standing Status:   Future    Standing Expiration Date:   10/02/2022   Sedimentation rate    Standing Status:   Future    Standing Expiration Date:   10/02/2022   C-reactive protein    Standing Status:   Future  Standing Expiration Date:   10/02/2022       [facility] 10/01/2021, 10:54 AM   I have personally spent 30 minutes involved in face-to-face and non-face-to-face activities for this patient on the day of the visit. Professional time spent includes the following activities: Preparing to see the patient (review of tests), Obtaining and/or reviewing separately obtained history (admission/discharge record), Performing a medically appropriate examination and/or evaluation , Ordering medications/tests/procedures, referring and communicating with other health care professionals, Documenting clinical information in the EMR, Independently interpreting results (not separately reported), Communicating results to the patient/family/caregiver, Counseling and educating the patient/family/caregiver and Care coordination (not separately reported).

## 2021-10-01 NOTE — Patient Instructions (Signed)
Thank you for coming to see me today. It was a pleasure seeing you.  To Do: Labs today Continue antibiotics If labs today are stable, I will refill your antibiotics as is Make another lab appointment in 2 weeks from today and I will see you for follow up in 4 weeks.   If you have any questions or concerns, please do not hesitate to call the office at [phone].  Take Care,   Jule Ser

## 2021-10-02 ENCOUNTER — Other Ambulatory Visit: Payer: Self-pay | Admitting: Internal Medicine

## 2021-10-02 ENCOUNTER — Telehealth: Payer: Self-pay

## 2021-10-02 ENCOUNTER — Encounter (HOSPITAL_BASED_OUTPATIENT_CLINIC_OR_DEPARTMENT_OTHER): Payer: BLUE CROSS/BLUE SHIELD | Admitting: General Surgery

## 2021-10-02 DIAGNOSIS — E1169 Type 2 diabetes mellitus with other specified complication: Secondary | ICD-10-CM | POA: Diagnosis not present

## 2021-10-02 LAB — GLUCOSE, CAPILLARY
Glucose-Capillary: 132 mg/dL — ABNORMAL HIGH (ref 70–99)
Glucose-Capillary: 135 mg/dL — ABNORMAL HIGH (ref 70–99)

## 2021-10-02 LAB — COMPREHENSIVE METABOLIC PANEL
AG Ratio: 1.2 (calc) (ref 1.0–2.5)
ALT: 64 U/L — ABNORMAL HIGH (ref 9–46)
AST: 34 U/L (ref 10–35)
Albumin: 4.4 g/dL (ref 3.6–5.1)
Alkaline phosphatase (APISO): 91 U/L (ref 35–144)
BUN/Creatinine Ratio: 19 (calc) (ref 6–22)
BUN: 28 mg/dL — ABNORMAL HIGH (ref 7–25)
CO2: 27 mmol/L (ref 20–32)
Calcium: 10.1 mg/dL (ref 8.6–10.3)
Chloride: 104 mmol/L (ref 98–110)
Creat: 1.48 mg/dL — ABNORMAL HIGH (ref 0.70–1.30)
Globulin: 3.7 g/dL (calc) (ref 1.9–3.7)
Glucose, Bld: 108 mg/dL — ABNORMAL HIGH (ref 65–99)
Potassium: 4.3 mmol/L (ref 3.5–5.3)
Sodium: 139 mmol/L (ref 135–146)
Total Bilirubin: 1 mg/dL (ref 0.2–1.2)
Total Protein: 8.1 g/dL (ref 6.1–8.1)

## 2021-10-02 LAB — SEDIMENTATION RATE: Sed Rate: 9 mm/h (ref 0–20)

## 2021-10-02 LAB — CBC
HCT: 38.2 % — ABNORMAL LOW (ref 38.5–50.0)
Hemoglobin: 12.4 g/dL — ABNORMAL LOW (ref 13.2–17.1)
MCH: 26 pg — ABNORMAL LOW (ref 27.0–33.0)
MCHC: 32.5 g/dL (ref 32.0–36.0)
MCV: 80.1 fL (ref 80.0–100.0)
MPV: 9 fL (ref 7.5–12.5)
Platelets: 177 10*3/uL (ref 140–400)
RBC: 4.77 10*6/uL (ref 4.20–5.80)
RDW: 13.3 % (ref 11.0–15.0)
WBC: 7.8 10*3/uL (ref 3.8–10.8)

## 2021-10-02 LAB — C-REACTIVE PROTEIN: CRP: 7.5 mg/L (ref <8.0)

## 2021-10-02 MED ORDER — LINEZOLID 600 MG PO TABS: 600.0000 mg | ORAL_TABLET | Freq: Two times a day (BID) | ORAL | 0 refills | Status: DC

## 2021-10-02 MED ORDER — LEVOFLOXACIN 750 MG PO TABS: 750.0000 mg | ORAL_TABLET | Freq: Every day | ORAL | 0 refills | Status: DC

## 2021-10-02 NOTE — Progress Notes (Addendum)
[name], [name] ([id number]) Visit Report for 10/02/2021 HBO Details Patient Name: Date of Service: [name], [name] 10/02/2021 8:00 A M Medical Record Number: [id number] Patient Account Number: [id number] Date of Birth/Sex: Treating RN: [birth year] [name] Primary Care Griffen Frayne: [name] Other Clinician: [name] Referring [name]: Treating [name]/Extender: [name] in Treatment: 12 HBO Treatment Course Details Treatment Course Number: 1 Ordering [name]: [name] T Treatments Ordered: otal 80 HBO Treatment Start Date: 08/14/2021 HBO Indication: Diabetic Ulcer(s) of the Lower Extremity Standard/Conservative Wound Care tried and failed greater than or equal to 30 days HBO Treatment Details Treatment Number: 35 Patient Type: Outpatient Chamber Type: Monoplace Chamber Serial #: [id number] Treatment Protocol: 2.0 ATA with 90 minutes oxygen, and no air breaks Treatment Details Compression Rate Down: 2.0 psi / minute De-Compression Rate Up: 2.0 psi / minute Air breaks and breathing Decompress Decompress Compress Tx Pressure Begins Reached periods Begins Ends (leave unused spaces blank) Chamber Pressure (ATA 1 2 ------2 1 ) Clock Time (24 hr) 08:15 08:13 - - - - - - 09:53 10:01 Treatment Length: 106 (minutes) Treatment Segments: 4 Vital Signs Capillary Blood Glucose Reference Range: 80 - 120 mg / dl HBO Diabetic Blood Glucose Intervention Range: <131 mg/dl or >249 mg/dl Time Vitals Blood Respiratory Capillary Blood Glucose Pulse Action Type: Pulse: Temperature: Taken: Pressure: Rate: Glucose (mg/dl): Meter #: Oximetry (%) Taken: Pre 08:11 113/67 85 16 97.9 132 Post 10:19 118/71 87 18 97.3 135 Treatment Response Treatment Toleration: Well Treatment Completion Status: Treatment Completed without Adverse Event Additional Procedure Documentation Tissue Sevierity: Necrosis of bone Physician HBO  Attestation: I certify that I supervised this HBO treatment in accordance with Medicare guidelines. A trained emergency response team is readily available per Yes hospital policies and procedures. Continue HBOT as ordered. Yes Electronic Signature(s) Signed: 10/02/2021 4:13:43 PM By: [name] Maudlin MD FACS Previous Signature: 10/02/2021 3:41:05 PM Version By: [name] EMT Entered By: [name] on 10/02/2021 16:13:43 -------------------------------------------------------------------------------- HBO Safety Checklist Details Patient Name: Date of Service: [name], [name] 10/02/2021 8:00 A M Medical Record Number: [id number] Patient Account Number: [id number] Date of Birth/Sex: Treating RN: 26-Apr-1970 [name] Primary Care [name]: [name] Other Clinician: [name] Referring Delvecchio Madole: Treating [name]/Extender: [name] in Treatment: 12 HBO Safety Checklist Items Safety Checklist Consent Form Signed Patient voided / foley secured and emptied When did you last eato 0705 Last dose of injectable or oral agent 0655 Ostomy pouch emptied and vented if applicable NA All implantable devices assessed, documented and approved NA Intravenous access site secured and place NA Valuables secured Linens and cotton and cotton/polyester blend (less than 51% polyester) Personal oil-based products / skin lotions / body lotions removed Wigs or hairpieces removed NA Smoking or tobacco materials removed Books / newspapers / magazines / loose paper removed Cologne, aftershave, perfume and deodorant removed Jewelry removed (may wrap wedding band) NA Make-up removed NA Hair care products removed Battery operated devices (external) removed Heating patches and chemical warmers removed Titanium eyewear removed NA Nail polish cured greater than 10 hours NA Casting material cured greater than 10 hours NA Hearing  aids removed NA Loose dentures or partials removed NA Prosthetics have been removed NA Patient demonstrates correct use of air break device (if applicable) Patient concerns have been addressed Patient grounding bracelet on and cord attached to chamber Specifics for Inpatients (complete in addition to above) Medication sheet sent with patient  NA Intravenous medications needed or due during therapy sent with patient NA Drainage tubes (e.g. nasogastric tube or chest tube secured and vented) NA Endotracheal or Tracheotomy tube secured NA Cuff deflated of air and inflated with saline NA Airway suctioned NA Notes The safety checklist was done before the treatment was started. Electronic Signature(s) Signed: 10/02/2021 3:39:53 PM By: [name] EMT Entered By: [name] on 10/02/2021 15:39:53

## 2021-10-02 NOTE — Telephone Encounter (Signed)
-----   Message from Mignon Pine, DO sent at 10/02/2021  3:52 PM EDT ----- Can you please let patient know that platelets appear to be affected by the linezolid antibiotic he is taking.  I think it is okay for him to continue for now and I will send in another refill, but would like for him to come in early next week (Monday or Tuesday) for his lab appointment to repeat the labs that are ordered instead of on 10/15/21.    Also, his inflammatory markers have normalized with antibiotic treatment which is reassuring from an infection standpoint.  Thanks, Mitzi Hansen

## 2021-10-02 NOTE — Telephone Encounter (Signed)
Left voicemail asking patient to return my call.   Macenzie Burford P Jannely Henthorn, CMA  

## 2021-10-02 NOTE — Progress Notes (Signed)
[name], [name] ([id number]) Visit Report for 10/02/2021 Arrival Information Details Patient Name: Date of Service: [place], [place] [name] 10/02/2021 8:00 A M Medical Record Number: [id number] Patient Account Number: [id number] Date of Birth/Sex: Treating RN: 1970/09/24 (51 y.o. [name] Primary Care Jammie Troup: [name] Other Clinician: [name] Referring Aixa Corsello: Treating Shaleah Nissley/Extender: [name] in Treatment: 12 Visit Information History Since Last Visit All ordered tests and consults were completed: Yes Patient Arrived: Ambulatory Added or deleted any medications: No Arrival Time: 07:38 Any new allergies or adverse reactions: No Accompanied By: None Had a fall or experienced change in No Transfer Assistance: None activities of daily living that may affect Patient Identification Verified: Yes risk of falls: Secondary Verification Process Completed: Yes Signs or symptoms of abuse/neglect since last visito No Patient Requires Transmission-Based Precautions: No Hospitalized since last visit: No Patient Has Alerts: Yes Implantable device outside of the clinic excluding No cellular tissue based products placed in the center since last visit: Pain Present Now: No Electronic Signature(s) Signed: 10/02/2021 3:38:38 PM By: [name] EMT Entered By: [name] on 10/02/2021 15:38:37 -------------------------------------------------------------------------------- Encounter Discharge Information Details Patient Name: Date of Service: [place], [name] 10/02/2021 8:00 A M Medical Record Number: [id number] Patient Account Number: [id number] Date of Birth/Sex: Treating RN: 1970/08/22 (51 y.o. [name] Primary Care Odysseus Cada: [name] Other Clinician: [name] Referring Ngina Royer: Treating Detra Bores/Extender: [name] in Treatment: 12 Encounter Discharge Information  Items Discharge Condition: Stable Ambulatory Status: Ambulatory Discharge Destination: Home Transportation: Private Auto Accompanied By: None Schedule Follow-up Appointment: Yes Clinical Summary of Care: Electronic Signature(s) Signed: 10/02/2021 3:44:34 PM By: [name] EMT Entered By: [name] on 10/02/2021 15:44:34 -------------------------------------------------------------------------------- Vitals Details Patient Name: Date of Service: [name], [name] 10/02/2021 8:00 A M Medical Record Number: [id number] Patient Account Number: [id number] Date of Birth/Sex: Treating RN: 05-02-1970 (51 y.o. [name] Primary Care Deona Novitski: [name] Other Clinician: [name] Referring Rajon Bisig: Treating Garlene Apperson/Extender: [name] in Treatment: 12 Vital Signs Time Taken: 08:11 Temperature (F): 97.9 Height (in): 72 Pulse (bpm): 85 Weight (lbs): 312 Respiratory Rate (breaths/min): 16 Body Mass Index (BMI): 42.3 Blood Pressure (mmHg): 113/67 Capillary Blood Glucose (mg/dl): 132 Reference Range: 80 - 120 mg / dl Electronic Signature(s) Signed: 10/02/2021 3:39:05 PM By: [name] EMT Entered By: [name] on 10/02/2021 15:39:05

## 2021-10-02 NOTE — Progress Notes (Signed)
[name], [name] ([id number]) Visit Report for 10/02/2021 Problem List Details Patient Name: Date of Service: [name], [name] 10/02/2021 8:00 A M Medical Record Number: [id number] Patient Account Number: [id number] Date of Birth/Sex: Treating RN: Jan 25, 1970 (51 y.o. [name] Primary Care Provider: [name] Other Clinician: [name] Referring Provider: Treating Provider/Extender: [name] in Treatment: 12 Active Problems ICD-10 Encounter Code Description Active Date MDM Diagnosis 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 0/86/5784 No Yes M86.671 Other chronic osteomyelitis, right ankle and foot 07/04/2021 No Yes L97.514 Non-pressure chronic ulcer of other part of right foot with necrosis of bone 07/04/2021 No Yes Inactive Problems Resolved Problems Electronic Signature(s) Signed: 10/02/2021 3:41:43 PM By: [name] EMT Signed: 10/02/2021 4:11:53 PM By: [name] MD FACS Entered By: [name] on 10/02/2021 15:41:43 -------------------------------------------------------------------------------- SuperBill Details Patient Name: Date of Service: [name], [name] 10/02/2021 Medical Record Number: [id number] Patient Account Number: [id number] Date of Birth/Sex: Treating RN: 01/31/1970 (51 y.o. [name] Primary Care Provider: [name] Other Clinician: [name] Referring Provider: Treating Provider/Extender: [name] in Treatment: 12 Diagnosis Coding ICD-10 Codes Code Description 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 X32.440 Other chronic osteomyelitis, right ankle and foot L97.514 Non-pressure chronic ulcer of other part of right foot with necrosis of bone Facility Procedures CPT4 Code: 10272536 Description: G0277-(Facility Use Only) HBOT full body chamber, 34mn , ICD-10 Diagnosis  Description 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 LU44.034Non-pressure chronic ulcer of other part of right foot with necrosis  of bone M86.671 Other chronic osteomyelitis, right ankle and foot Modifier: Quantity: 4 Physician Procedures : CPT4 Code Description Modifier [id number] PHYS HYPERBARIC OXYGEN THERAPY ICD-10 Diagnosis Description 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 LE33.295Non-pressure chronic ulcer of other part of right foot  with necrosis of bone M86.671 Other chronic osteomyelitis, right ankle and foot Quantity: 1 Electronic Signature(s) Signed: 10/02/2021 3:41:39 PM By: GValeria BatmanEMT Signed: 10/02/2021 4:11:53 PM By: CFredirick MaudlinMD FACS Entered By: GValeria Batmanon 10/02/2021 15:41:38

## 2021-10-03 ENCOUNTER — Encounter (HOSPITAL_BASED_OUTPATIENT_CLINIC_OR_DEPARTMENT_OTHER): Payer: BLUE CROSS/BLUE SHIELD | Admitting: General Surgery

## 2021-10-03 DIAGNOSIS — E1169 Type 2 diabetes mellitus with other specified complication: Secondary | ICD-10-CM | POA: Diagnosis not present

## 2021-10-03 LAB — GLUCOSE, CAPILLARY
Glucose-Capillary: 137 mg/dL — ABNORMAL HIGH (ref 70–99)
Glucose-Capillary: 136 mg/dL — ABNORMAL HIGH (ref 70–99)

## 2021-10-03 NOTE — Progress Notes (Signed)
[name], Gianfranco M. ([id number]) Visit Report for 10/03/2021 Problem List Details Patient Name: Date of Service: [name], [name] 10/03/2021 8:00 A M Medical Record Number: [id number] Patient Account Number: [id number] Date of Birth/Sex: Treating RN: 1970/07/16 (51 y.o. [name] Primary Care Provider: [name] Other Clinician: [name] Referring Provider: Treating Provider/Extender: [name] in Treatment: 13 Active Problems ICD-10 Encounter Code Description Active Date MDM Diagnosis 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 7/82/9562 No Yes M86.671 Other chronic osteomyelitis, right ankle and foot 07/04/2021 No Yes L97.514 Non-pressure chronic ulcer of other part of right foot with necrosis of bone 07/04/2021 No Yes Inactive Problems Resolved Problems Electronic Signature(s) Signed: 10/03/2021 3:30:12 PM By: [name] EMT Signed: 10/03/2021 4:07:57 PM By: [name] MD FACS Entered By: [name] on 10/03/2021 15:30:12 -------------------------------------------------------------------------------- SuperBill Details Patient Name: Date of Service: [name], [name] 10/03/2021 Medical Record Number: [id number] Patient Account Number: [id number] Date of Birth/Sex: Treating RN: 12/12/1970 (51 y.o. [name], [name] Primary Care Provider: [name] Other Clinician: [name] Referring Provider: Treating Provider/Extender: [name] in Treatment: 13 Diagnosis Coding ICD-10 Codes Code Description 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 O96.295 Other chronic osteomyelitis, right ankle and foot L97.514 Non-pressure chronic ulcer of other part of right foot with necrosis of bone Facility Procedures CPT4 Code: [id number] Description: G0277-(Facility Use Only) HBOT full body chamber, 53mn , ICD-10 Diagnosis  Description 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 LW10.272Non-pressure chronic ulcer of other part of right foot with necrosis  of bone M86.671 Other chronic osteomyelitis, right ankle and foot Modifier: Quantity: 4 Physician Procedures : CPT4 Code Description Modifier [id number] PHYS HYPERBARIC OXYGEN THERAPY ICD-10 Diagnosis Description 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 LV95.638Non-pressure chronic ulcer of other part of right foot  with necrosis of bone M86.671 Other chronic osteomyelitis, right ankle and foot Quantity: 1 Electronic Signature(s) Signed: 10/03/2021 3:30:03 PM By: GValeria BatmanEMT Signed: 10/03/2021 4:07:57 PM By: CFredirick MaudlinMD FACS Entered By: GValeria Batmanon 10/03/2021 15:30:03

## 2021-10-03 NOTE — Telephone Encounter (Signed)
Patient made aware of all recent lab results and Dr. Alcario Drought recommendation for repeat labs.  Patient verbalized understanding and agrees to come in the office next week for repeat labs. Current orders are placed.  [name]

## 2021-10-03 NOTE — Progress Notes (Signed)
[name], [name] ([id number]) Visit Report for 10/03/2021 Arrival Information Details Patient Name: Date of Service: [place], [place] [name] 10/03/2021 8:00 A M Medical Record Number: [id number] Patient Account Number: [id number] Date of Birth/Sex: Treating RN: 28-Mar-1970 [name] Primary Care June Rode: [name] Other Clinician: Valeria [name] Referring [name]: Treating Henson Fraticelli/Extender: [name] in Treatment: 72 Visit Information History Since Last Visit All ordered tests and consults were completed: Yes Patient Arrived: Ambulatory Added or deleted any medications: No Arrival Time: 07:45 Any new allergies or adverse reactions: No Accompanied By: None Had a fall or experienced change in No Transfer Assistance: None activities of daily living that may affect Patient Identification Verified: Yes risk of falls: Secondary Verification Process Completed: Yes Signs or symptoms of abuse/neglect since last visito No Patient Requires Transmission-Based Precautions: No Hospitalized since last visit: No Patient Has Alerts: Yes Implantable device outside of the clinic excluding No cellular tissue based products placed in the center since last visit: Pain Present Now: No Electronic Signature(s) Signed: 10/03/2021 3:26:34 PM By: [name] EMT Entered By: [name] on 10/03/2021 15:26:34 -------------------------------------------------------------------------------- Encounter Discharge Information Details Patient Name: Date of Service: [place], [name] 10/03/2021 8:00 A M Medical Record Number: [id number] Patient Account Number: [id number] Date of Birth/Sex: Treating RN: 02/25/1970 [name] Primary Care [name]: [name] Other Clinician: Valeria [name] Referring Kumiko Fishman: Treating [name]/Extender: [name] in Treatment: 13 Encounter Discharge Information  Items Discharge Condition: Stable Ambulatory Status: Ambulatory Discharge Destination: Home Transportation: Private Auto Accompanied By: None Schedule Follow-up Appointment: Yes Clinical Summary of Care: Electronic Signature(s) Signed: 10/03/2021 3:30:40 PM By: [name] EMT Entered By: [name] on 10/03/2021 15:30:40 -------------------------------------------------------------------------------- Vitals Details Patient Name: Date of Service: [name], [name] 10/03/2021 8:00 A M Medical Record Number: [id number] Patient Account Number: [id number] Date of Birth/Sex: Treating RN: 1970-01-23 [name] Primary Care Oyuki Hogan: [name] Other Clinician: Valeria [name] Referring Yuval Nolet: Treating [name]/Extender: [name] in Treatment: 13 Vital Signs Time Taken: 08:21 Temperature (F): 97.5 Height (in): 72 Pulse (bpm): 93 Weight (lbs): 312 Respiratory Rate (breaths/min): 16 Body Mass Index (BMI): 42.3 Blood Pressure (mmHg): 130/73 Capillary Blood Glucose (mg/dl): 137 Reference Range: 80 - 120 mg / dl Electronic Signature(s) Signed: 10/03/2021 3:26:59 PM By: [name] EMT Entered By: [name] on 10/03/2021 15:26:59

## 2021-10-03 NOTE — Progress Notes (Addendum)
[name], [name] ([id number]) Visit Report for 10/03/2021 HBO Details Patient Name: Date of Service: [name], [name] 10/03/2021 8:00 A M Medical Record Number: [id number] Patient Account Number: [id number] Date of Birth/Sex: Treating RN: April 03, 1970 [name], [name] Primary Care Jayln Madeira: [name] Other Clinician: [name] Referring Jourdyn Ferrin: Treating Syretta Kochel/Extender: [name] in Treatment: 13 HBO Treatment Course Details Treatment Course Number: 1 Ordering Trixie Maclaren: [name] T Treatments Ordered: otal 80 HBO Treatment Start Date: 08/14/2021 HBO Indication: Diabetic Ulcer(s) of the Lower Extremity Standard/Conservative Wound Care tried and failed greater than or equal to 30 days HBO Treatment Details Treatment Number: 36 Patient Type: Outpatient Chamber Type: Monoplace Chamber Serial #: [id number] Treatment Protocol: 2.0 ATA with 90 minutes oxygen, and no air breaks Treatment Details Compression Rate Down: 2.0 psi / minute De-Compression Rate Up: 2.0 psi / minute Air breaks and breathing Decompress Decompress Compress Tx Pressure Begins Reached periods Begins Ends (leave unused spaces blank) Chamber Pressure (ATA 1 2 ------2 1 ) Clock Time (24 hr) 08:31 08:39 - - - - - - 10:09 10:17 Treatment Length: 106 (minutes) Treatment Segments: 4 Vital Signs Capillary Blood Glucose Reference Range: 80 - 120 mg / dl HBO Diabetic Blood Glucose Intervention Range: <131 mg/dl or >249 mg/dl Time Vitals Blood Respiratory Capillary Blood Glucose Pulse Action Type: Pulse: Temperature: Taken: Pressure: Rate: Glucose (mg/dl): Meter #: Oximetry (%) Taken: Pre 08:21 130/73 93 16 97.5 137 Post 10:20 123/81 79 16 97.2 136 Treatment Response Treatment Toleration: Well Treatment Completion Status: Treatment Completed without Adverse Event Additional Procedure Documentation Tissue Sevierity: Necrosis of bone Physician HBO  Attestation: I certify that I supervised this HBO treatment in accordance with Medicare guidelines. A trained emergency response team is readily available per Yes hospital policies and procedures. Continue HBOT as ordered. Yes Electronic Signature(s) Signed: 10/03/2021 4:09:25 PM By: [name] MD FACS Previous Signature: 10/03/2021 3:29:33 PM Version By: [name] EMT Entered By: [name] on 10/03/2021 16:09:24 -------------------------------------------------------------------------------- HBO Safety Checklist Details Patient Name: Date of Service: [name], [name] 10/03/2021 8:00 A M Medical Record Number: [id number] Patient Account Number: [id number] Date of Birth/Sex: Treating RN: 1970-12-23 [name], [name] Primary Care Kirke Breach: [name] Other Clinician: [name] Referring Mi Balla: Treating Lisamarie Coke/Extender: [name] in Treatment: 13 HBO Safety Checklist Items Safety Checklist Consent Form Signed Patient voided / foley secured and emptied When did you last eato 0710 Last dose of injectable or oral agent 0645 Ostomy pouch emptied and vented if applicable NA All implantable devices assessed, documented and approved Penile implant Intravenous access site secured and place NA Valuables secured Linens and cotton and cotton/polyester blend (less than 51% polyester) Personal oil-based products / skin lotions / body lotions removed Wigs or hairpieces removed NA Smoking or tobacco materials removed Books / newspapers / magazines / loose paper removed Cologne, aftershave, perfume and deodorant removed Jewelry removed (may wrap wedding band) NA Make-up removed NA Hair care products removed Battery operated devices (external) removed Heating patches and chemical warmers removed Titanium eyewear removed NA Nail polish cured greater than 10 hours NA Casting material cured greater than 10  hours NA Hearing aids removed NA Loose dentures or partials removed NA Prosthetics have been removed NA Patient demonstrates correct use of air break device (if applicable) Patient concerns have been addressed Patient grounding bracelet on and cord attached to chamber Specifics for Inpatients (complete in addition to above) Medication sheet sent with  patient NA Intravenous medications needed or due during therapy sent with patient NA Drainage tubes (e.g. nasogastric tube or chest tube secured and vented) NA Endotracheal or Tracheotomy tube secured NA Cuff deflated of air and inflated with saline NA Airway suctioned NA Notes The safety checklist was done before the treatment was started. Electronic Signature(s) Signed: 10/03/2021 3:28:16 PM By: [name] EMT Entered By: [name] on 10/03/2021 15:28:15

## 2021-10-04 ENCOUNTER — Encounter (HOSPITAL_BASED_OUTPATIENT_CLINIC_OR_DEPARTMENT_OTHER): Payer: BLUE CROSS/BLUE SHIELD | Admitting: General Surgery

## 2021-10-04 DIAGNOSIS — E1169 Type 2 diabetes mellitus with other specified complication: Secondary | ICD-10-CM | POA: Diagnosis not present

## 2021-10-04 LAB — GLUCOSE, CAPILLARY
Glucose-Capillary: 107 mg/dL — ABNORMAL HIGH (ref 70–99)
Glucose-Capillary: 109 mg/dL — ABNORMAL HIGH (ref 70–99)
Glucose-Capillary: 114 mg/dL — ABNORMAL HIGH (ref 70–99)
Glucose-Capillary: 105 mg/dL — ABNORMAL HIGH (ref 70–99)

## 2021-10-04 NOTE — Progress Notes (Addendum)
[name], [name] ([id number]) Visit Report for 10/04/2021 Arrival Information Details Patient Name: Date of Service: [name] 10/04/2021 8:00 [name] Medical Record Number: [id number] Patient Account Number: [id number] Date of Birth/Sex: Treating RN: [birth year] [name], [name] Primary Care [name]: [name] Other Clinician: Valeria [name] Referring [name]: Treating [name] in Treatment: 13 Visit Information History Since Last Visit All ordered tests and consults were completed: Yes Patient Arrived: Ambulatory Added or deleted any medications: No Arrival Time: 07:39 Any new allergies or adverse reactions: No Accompanied By: None Had a fall or experienced change in No Transfer Assistance: None activities of daily living that may affect Patient Identification Verified: Yes risk of falls: Secondary Verification Process Completed: Yes Signs or symptoms of abuse/neglect since last visito No Patient Requires Transmission-Based Precautions: No Hospitalized since last visit: No Patient Has Alerts: Yes Implantable device outside of the clinic excluding No cellular tissue based products placed in the center since last visit: Pain Present Now: No Electronic Signature(s) Signed: 10/04/2021 12:42:25 PM By: [name] EMT Entered By: [name] on 10/04/2021 12:42:25 -------------------------------------------------------------------------------- Encounter Discharge Information Details Patient Name: Date of Service: [name], [name] 10/04/2021 8:00 [name] Medical Record Number: [id number] Patient Account Number: [id number] Date of Birth/Sex: Treating RN: 1971-01-03 [name] Primary Care Zaryan Yakubov: [name] Other Clinician: Valeria [name] Referring Nathanael Krist: Treating Aylinn Rydberg/Extender: Delman [name] in Treatment: 13 Encounter Discharge Information  Items Discharge Condition: Stable Ambulatory Status: Ambulatory Discharge Destination: Home Transportation: Private Auto Accompanied By: None Schedule Follow-up Appointment: Yes Clinical Summary of Care: Electronic Signature(s) Signed: 10/04/2021 12:52:10 PM By: [name] EMT Entered By: [name] on 10/04/2021 12:52:10 -------------------------------------------------------------------------------- Vitals Details Patient Name: Date of Service: [name], [name] 10/04/2021 8:00 [name] Medical Record Number: [id number] Patient Account Number: [id number] Date of Birth/Sex: Treating RN: 1970/11/06 [name] Primary Care Haileyann Staiger: [name] Other Clinician: Valeria [name] Referring Averey Koning: Treating Anjelica Gorniak/Extender: Delman [name] in Treatment: 13 Vital Signs Time Taken: 07:55 Temperature (F): 97.9 Height (in): 72 Pulse (bpm): 92 Weight (lbs): 312 Respiratory Rate (breaths/min): 16 Body Mass Index (BMI): 42.3 Blood Pressure (mmHg): 132/79 Capillary Blood Glucose (mg/dl): 107 Reference Range: 80 - 120 mg / dl Electronic Signature(s) Signed: 10/04/2021 12:43:46 PM By: [name] EMT Entered By: [name] on 10/04/2021 12:43:46

## 2021-10-04 NOTE — Progress Notes (Signed)
[name], [name] ([id number]) Visit Report for 10/04/2021 Problem List Details Patient Name: Date of Service: [name], [name] 10/04/2021 8:00 A M Medical Record Number: [id number] Patient Account Number: [id number] Date of Birth/Sex: Treating RN: [birth year] (51 y.o. 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 10/04/2021 Medical Record Number: [id number] Patient Account Number: [id number] Date of Birth/Sex: Treating RN: 04-18-1970 (51 y.o. 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 LH70.263Non-pressure chronic ulcer of other part of right foot with necrosis  of bone M86.671 Other chronic osteomyelitis, right ankle and foot Modifier: Quantity: 4 Physician Procedures : CPT4 Code Description Modifier [id number] PHYS HYPERBARIC OXYGEN THERAPY ICD-10 Diagnosis Description 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

## 2021-10-04 NOTE — Progress Notes (Signed)
[name], [name] ([id number]) Visit Report for 10/04/2021 HBO Safety Checklist Details Patient Name: Date of Service: [name], [place] [name] 10/04/2021 8:00 A M Medical Record Number: [id number] Patient Account Number: [id number] Date of Birth/Sex: Treating RN: [birth year] [name] Primary Care [name]: [name] Other Clinician: Valeria [name] Referring [name]: Treating [name]/Extender: [name] in Treatment: 13 HBO Safety Checklist Items Safety Checklist Consent Form Signed Patient voided / foley secured and emptied When did you last eato 0705 Last dose of injectable or oral agent 0645 Ostomy pouch emptied and vented if applicable NA All implantable devices assessed, documented and approved NA Intravenous access site secured and place NA Valuables secured Linens and cotton and cotton/polyester blend (less than 51% polyester) Personal oil-based products / skin lotions / body lotions removed Wigs or hairpieces removed NA Smoking or tobacco materials removed Books / newspapers / magazines / loose paper removed Cologne, aftershave, perfume and deodorant removed Jewelry removed (may wrap wedding band) NA Make-up removed NA Hair care products removed Battery operated devices (external) removed Heating patches and chemical warmers removed Titanium eyewear removed NA Nail polish cured greater than 10 hours NA Casting material cured greater than 10 hours NA Hearing aids removed NA Loose dentures or partials removed NA Prosthetics have been removed NA Patient demonstrates correct use of air break device (if applicable) Patient concerns have been addressed Patient grounding bracelet on and cord attached to chamber Specifics for Inpatients (complete in addition to above) Medication sheet sent with patient NA Intravenous medications needed or due during therapy sent with patient NA Drainage tubes (e.g. nasogastric  tube or chest tube secured and vented) NA Endotracheal or Tracheotomy tube secured NA Cuff deflated of air and inflated with saline NA Airway suctioned NA Notes The safety checklist was done before the treatment was started. Electronic Signature(s) Signed: 10/04/2021 12:45:04 PM By: [name] EMT Entered By: [name] on 10/04/2021 12:45:04

## 2021-10-07 ENCOUNTER — Other Ambulatory Visit: Payer: Self-pay

## 2021-10-07 ENCOUNTER — Encounter (HOSPITAL_BASED_OUTPATIENT_CLINIC_OR_DEPARTMENT_OTHER): Payer: BLUE CROSS/BLUE SHIELD | Admitting: Internal Medicine

## 2021-10-07 ENCOUNTER — Other Ambulatory Visit: Payer: BLUE CROSS/BLUE SHIELD

## 2021-10-07 ENCOUNTER — Encounter (HOSPITAL_BASED_OUTPATIENT_CLINIC_OR_DEPARTMENT_OTHER): Payer: BLUE CROSS/BLUE SHIELD | Admitting: General Surgery

## 2021-10-07 DIAGNOSIS — L97514 Non-pressure chronic ulcer of other part of right foot with necrosis of bone: Secondary | ICD-10-CM

## 2021-10-07 DIAGNOSIS — E1149 Type 2 diabetes mellitus with other diabetic neurological complication: Secondary | ICD-10-CM | POA: Diagnosis not present

## 2021-10-07 DIAGNOSIS — E1169 Type 2 diabetes mellitus with other specified complication: Secondary | ICD-10-CM | POA: Diagnosis not present

## 2021-10-07 DIAGNOSIS — M86671 Other chronic osteomyelitis, right ankle and foot: Secondary | ICD-10-CM | POA: Diagnosis not present

## 2021-10-07 DIAGNOSIS — M86171 Other acute osteomyelitis, right ankle and foot: Secondary | ICD-10-CM

## 2021-10-07 LAB — GLUCOSE, CAPILLARY
Glucose-Capillary: 142 mg/dL — ABNORMAL HIGH (ref 70–99)
Glucose-Capillary: 132 mg/dL — ABNORMAL HIGH (ref 70–99)

## 2021-10-07 NOTE — Progress Notes (Signed)
[name], [name] ([id number]) Visit Report for 10/07/2021 Chief Complaint Document Details Patient Name: Date of Service: [place], [place] [name] 10/07/2021 7:45 A M Medical Record Number: [id number] Patient Account Number: [id number] Date of Birth/Sex: Treating RN: Apr 05, 1970 (51 y.o. [name] Primary Care Provider: [name] Other Clinician: Referring Provider: Treating Provider/Extender: [name] in Treatment: 13 Information Obtained from: Patient Chief Complaint Patients presents for treatment of an open diabetic ulcer Electronic Signature(s) Signed: 10/07/2021 7:53:17 AM By: [name] MD FACS Entered By: [name] on 10/07/2021 07:53:17 -------------------------------------------------------------------------------- Debridement Details Patient Name: Date of Service: [name], [name] 10/07/2021 7:45 A M Medical Record Number: [id number] Patient Account Number: [id number] Date of Birth/Sex: Treating RN: 03/23/1970 (51 y.o. [name] Primary Care Provider: [name] Other Clinician: Referring Provider: Treating Provider/Extender: [name] in Treatment: 13 Debridement Performed for Assessment: Wound #2 Right,Lateral,Plantar Foot Performed By: Physician [name], MD Debridement Type: Debridement Severity of Tissue Pre Debridement: Fat layer exposed Level of Consciousness (Pre-procedure): Awake and Alert Pre-procedure Verification/Time Out Yes - 07:48 Taken: Start Time: 07:48 Pain Control: Lidocaine 4% T opical Solution T Area Debrided (L x W): otal 0.1 (cm) x 1 (cm) = 0.1 (cm) Tissue and other material debrided: Non-Viable, Callus, Skin: Epidermis Level: Skin/Epidermis Debridement Description: Selective/Open Wound Instrument: Curette Bleeding: Minimum Hemostasis Achieved: Pressure Procedural Pain: 0 Post Procedural Pain: 0 Response to Treatment: Procedure was  tolerated well Level of Consciousness (Post- Awake and Alert procedure): Post Debridement Measurements of Total Wound Length: (cm) 0.1 Width: (cm) 1 Depth: (cm) 1.5 Volume: (cm) 0.118 Character of Wound/Ulcer Post Debridement: Improved Severity of Tissue Post Debridement: Fat layer exposed Post Procedure Diagnosis Same as Pre-procedure Notes scribed for [name] by Adline Peals, RN Electronic Signature(s) Signed: 10/07/2021 12:49:39 PM By: [name] MD FACS Signed: 10/07/2021 5:25:08 PM By: Adline Peals Previous Signature: 10/07/2021 8:27:13 AM Version By: [name] MD FACS Entered By: Adline Peals on 10/07/2021 10:32:05 -------------------------------------------------------------------------------- HPI Details Patient Name: Date of Service: [name], [name] 10/07/2021 7:45 A M Medical Record Number: [id number] Patient Account Number: [id number] Date of Birth/Sex: Treating RN: 23-May-1970 (51 y.o. 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 The most recent amputation was in August 2022. At that operation, antibiotic beads were placed in the wound. He has been managed by podiatry for his procedures and management of his wounds. He has been in a Water engineer. He is on oral doxycycline. They have been using Betadine wet to dry dressings along with Iodosorb. The patient states that when he thinks the wound is getting too dry, he applies topical Neosporin. At his last visit, on June 7 of this year, the podiatrist determined that he felt the wound was stalled and referred  him to wound care for additional evaluation and management. An MRI has been ordered, but not yet scheduled or performed. Pathology from his operation in August 2022 demonstrated findings consistent with chronic osteomyelitis. Today, there is a large irregular wound on the plantar surface of his right foot, at about the level of the fifth metatarsal head. This tracks through to a pinpoint opening on  the dorsal lateral portion of his foot. The intake nurse reported purulent drainage. There is some malodor from the wound. No frank necrosis identified. 07/11/2021: Today, the wounds do not connect. I attempted multiple times from various directions and the shared tunnel is no longer open. He has some slough accumulation on the dorsal part of his foot as well as slough and callus buildup on the plantar surface. His MRI is scheduled for June 29. No purulent drainage or malodor appreciated today. 07/19/2021: The lateral foot wound has closed and there is no tunnel connecting the plantar foot wound to that site. The plantar foot wound still probes quite deeply, however. There is some slough, eschar, and nonviable tissue accumulated in the wound bed. No malodorous drainage present. His MRI was performed yesterday and is consistent with fairly extensive osteomyelitis. 07/29/2021: The patient has an appointment with infectious disease on July 18 to treat his osteomyelitis. The plantar wound still probes quite deeply, approaching bone. The orifice has narrowed quite substantially, however, making it more difficult for him to pack. 08/05/2021: The tunnel connecting the lateral foot wound to the plantar foot wound has reopened. He sees infectious disease tomorrow to discuss long-term antibiotic treatment for osteomyelitis. There was a bit of murky drainage in the wound, but this was noted after he had had topical lidocaine applied so may have just been a blob of the anesthetic. No odor or frank purulent drainage. The  wound probes deeply at the midfoot approaching bone. He does have MRI results confirming his diagnosis of osteomyelitis. He has failed to progress with conventional treatment and I think his best chance for preservation of the foot is to initiate hyperbaric oxygen therapy. 08/13/2021: The tunnel connecting the 2 wounds has closed again. He has a PICC line and is getting IV daptomycin and cefepime. EKG and chest x-ray are within normal limits. He is scheduled to start hyperbaric oxygen therapy tomorrow. The wound in his midfoot probes deeply, approaching bone. The skin at the orifice continues to heaped up and threatens to close over despite the large cavity within. No erythema, induration, or purulent drainage. The wound on his lateral foot is fairly small and quite clean. 08/19/2021: The lateral foot wound has nearly closed. The wound in his midfoot does not probe quite as deeply today. The skin at the orifice continues to try to roll in and obscure the opening. No frankly necrotic tissue appreciated. He is tolerating hyperbaric oxygen therapy. 08/26/2021: The lateral foot wound has closed completely. The wound in his midfoot is shallower again today. The wound orifice is contracting. We are using gentamicin and silver alginate. He continues on IV daptomycin and cefepime and is tolerating hyperbaric oxygen therapy without difficulty. 09/02/2021: His foot is a little bit sore today but he was up walking on it all weekend doing back to school shopping with his daughter. The tunneling is down to about 3 cm and I do not appreciate bone at the tip of the probe. The wound is clean but has some callus creating an overhanging lip at the distal aspect. He continues to receive hyperbaric oxygen therapy as well as IV daptomycin and cefepime. 09/09/2021: His wound continues to improve. The tunnel has come in by 0.9 cm. He continues to form callus around the orifice of the wound. He is tolerating hyperbaric oxygen  therapy along with his IV antibiotics. 09/16/2021: The tunneling is down to just half a centimeter. He continues to build up callus around the orifice of the wound.  He completed his course of IV antibiotics and his PICC line is scheduled to be removed this Friday. He is tolerating hyperbaric oxygen without difficulty. We have been applying topical gentamicin and silver alginate to his wound. 09/24/2021: The wound depth has come in even further. There is a little bit of callus buildup around the orifice. The opening is quite narrow, at this point. 09/30/2021: The wound depth is about the same this week. The opening continues to contract with callus accumulation. There is some discoloration of the skin on the lateral part of his foot and he admitted to walking more than usual this weekend and also wearing a different pair shoes. He continues to tolerate hyperbaric oxygen therapy without difficulty. 10/07/2021: The wound depth has contracted a bit and is now approximately 1 to 1.5 cm. The orifice of the wound continues to try and close in over the space. There is just some callus and skin around the margins obscuring the orifice. Electronic Signature(s) Signed: 10/07/2021 8:03:40 AM By: [name] MD FACS Entered By: [name] on 10/07/2021 08:03:39 -------------------------------------------------------------------------------- Physical Exam Details Patient Name: Date of Service: [name], [name] 10/07/2021 7:45 A M Medical Record Number: [id number] Patient Account Number: [id number] Date of Birth/Sex: Treating RN: [birth year] (51 y.o. [name] Primary Care Provider: [name] Other Clinician: Referring Provider: Treating Provider/Extender: [name] in Treatment: 13 Constitutional Hypertensive, asymptomatic. . . . No acute distress.Marland Kitchen Respiratory . Notes 10/07/2021: The wound depth has contracted a bit and is now approximately 1 to 1.5  cm. The orifice of the wound continues to try and close in over the space. There is just some callus and skin around the margins obscuring the orifice. Electronic Signature(s) Signed: 10/07/2021 8:04:10 AM By: [name] MD FACS Entered By: [name] on 10/07/2021 08:04:10 -------------------------------------------------------------------------------- Physician Orders Details Patient Name: Date of Service: [name], [name] 10/07/2021 7:45 A M Medical Record Number: [id number] Patient Account Number: [id number] Date of Birth/Sex: Treating RN: 02/08/1970 (51 y.o. 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 - Dr Celine Ahr Room 2 Return A Anesthetic Wound #2 [place] (In clinic) Topical Lidocaine 4% applied to wound bed Bathing/ Shower/ Hygiene Other Bathing/Shower/Hygiene Orders/Instructions: - Change dressing after bathing Off-Loading Other: - Try to not stand on your feet too much Hyperbaric Oxygen Therapy Evaluate for HBO Therapy Indication: - wagner grade 3 diabetic foot ulcer right foot 2.0 ATA for 90 Minutes without A Breaks ir Total Number of Treatments: - 40 more treatments for a total of 80 09/30/21 One treatments per day (delivered Monday through Friday unless otherwise specified in Special Instructions below): Finger stick Blood Glucose Pre- and Post- HBOT Treatment. Follow Hyperbaric Oxygen Glycemia Protocol Afrin (Oxymetazoline HCL) 0.05% nasal spray - 1 spray in both  nostrils daily as needed prior to HBO treatment for difficulty clearing ears Wound Treatment Wound #2 - Foot Wound Laterality: Plantar, Right, Lateral Cleanser: Soap and Water Every Other Day/15 Days Discharge Instructions: May shower and wash wound with dial antibacterial soap and water prior to dressing change. Cleanser: Wound Cleanser (Generic) Every Other Day/15 Days  Discharge Instructions: Cleanse the wound with wound cleanser prior to applying a clean dressing using gauze sponges, not tissue or cotton balls. Peri-Wound Care: cotton tipped applicators (Generic) Every Other Day/15 Days Topical: Gentamicin Every Other Day/15 Days Discharge Instructions: As directed by physician Prim Dressing: Iodoform packing strip 1/4 (in) Every Other Day/15 Days ary Discharge Instructions: Lightly pack as instructed Secondary Dressing: Woven Gauze Sponge, Non-Sterile 4x4 in Every Other Day/15 Days Discharge Instructions: Apply over primary dressing as directed. Secured With: Paper Tape, 2x10 (in/yd) (Generic) Every Other Day/15 Days Discharge Instructions: Secure dressing with tape as directed. Compression Wrap: Kerlix Roll 4.5x3.1 (in/yd) (Generic) Every Other Day/15 Days Discharge Instructions: Apply Kerlix and Coban compression as directed. Patient Medications llergies: No Known Allergies A Notifications Medication Indication Start End 10/07/2021 lidocaine DOSE topical 4 % cream - cream topical GLYCEMIA INTERVENTIONS PROTOCOL PRE-HBO GLYCEMIA INTERVENTIONS ACTION INTERVENTION Obtain pre-HBO capillary blood glucose (ensure 1 physician order is in chart). A. Notify HBO physician and await physician orders. 2 If result is 70 mg/dl or below: B. If the result meets the hospital definition of a critical result, follow hospital policy. A. Give patient an 8 ounce Glucerna Shake, an 8 ounce Ensure, or 8 ounces of a Glucerna/Ensure equivalent dietary supplement*. B. Wait 30 minutes. If  result is 71 mg/dl to 130 mg/dl: C. Retest patients capillary blood glucose (CBG). D. If result greater than or equal to 110 mg/dl, proceed with HBO. If result less than 110 mg/dl, notify HBO physician and consider holding HBO. If result is 131 mg/dl to 249 mg/dl: A. Proceed with HBO. A. Notify HBO physician and await physician orders. B. It is recommended to hold HBO and do If result is 250 mg/dl or greater: blood/urine ketone testing. C. If the result meets the hospital definition of a critical result, follow hospital policy. POST-HBO GLYCEMIA INTERVENTIONS ACTION INTERVENTION Obtain post HBO capillary blood glucose (ensure 1 physician order is in chart). A. Notify HBO physician and await physician orders. 2 If result is 70 mg/dl or below: B. If the result meets the hospital definition of a critical result, follow hospital policy. A. Give patient an 8 ounce Glucerna Shake, an 8 ounce Ensure, or 8 ounces of a Glucerna/Ensure equivalent dietary supplement*. B. Wait 15 minutes for symptoms of If result is 71 mg/dl to 100 mg/dl: hypoglycemia (i.e. nervousness, anxiety, sweating, chills, clamminess, irritability, confusion, tachycardia or dizziness). C. If patient asymptomatic, discharge patient. If patient symptomatic, repeat capillary blood glucose (CBG) and notify HBO physician. If result is 101 mg/dl to 249 mg/dl: A. Discharge patient. A. Notify HBO physician and await physician orders. B. It is recommended to do blood/urine ketone If result is 250 mg/dl or greater: testing. C. If the result meets the hospital definition of a critical result, follow hospital policy. *Juice or candies are NOT equivalent products. If patient refuses the Glucerna or Ensure, please consult the hospital dietitian for an appropriate substitute. Electronic Signature(s) Signed: 10/07/2021 8:27:13 AM By: [name] MD FACS Entered By: [name] on 10/07/2021  08:04:24 -------------------------------------------------------------------------------- Problem List Details Patient Name: Date of Service: [name], [name] 10/07/2021 7:45 A M Medical Record Number: [id number] Patient Account Number: [id number] Date of Birth/Sex: Treating RN: 1970/08/15 (51 y.o. 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 10/07/2021 7:45 A M Medical Record Number: [id number] Patient Account Number: [id number] Date of Birth/Sex: Treating RN: 1970/12/11 (51 y.o. 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 The most recent amputation was in August 2022. At that operation, antibiotic beads  were placed in the wound. He has been managed by podiatry for his procedures and management of his wounds. He has been in a Water engineer. He is on oral doxycycline. They have been using Betadine wet to dry dressings along with Iodosorb. The patient states that when he thinks the wound is getting too dry, he applies topical Neosporin. At his last visit, on June 7 of this year, the podiatrist determined that he felt the wound was stalled and referred him to wound care for additional evaluation and management. An MRI has been ordered, but not yet scheduled or performed. Pathology from his operation in August 2022 demonstrated findings consistent with chronic osteomyelitis. Today, there is a large irregular wound on the plantar surface of his right foot, at about the level of the fifth metatarsal head. This tracks through to a pinpoint opening on the dorsal lateral portion of his foot. The intake nurse reported purulent drainage. There is some malodor from the wound. No frank necrosis identified. 07/11/2021: Today, the wounds do not connect. I attempted multiple times from various directions and the shared tunnel is no longer open. He has some slough accumulation on the dorsal part of his foot as well as slough and callus buildup on the plantar surface. His MRI is scheduled for June 29. No purulent drainage or malodor appreciated today. 07/19/2021: The lateral foot wound has closed and there is no tunnel connecting the plantar foot wound to that site. The plantar foot wound still probes quite deeply, however. There is some slough, eschar, and nonviable tissue accumulated in the wound bed. No malodorous drainage present. His MRI was performed yesterday and is consistent with fairly extensive osteomyelitis. 07/29/2021: The patient has an appointment with infectious disease on July 18 to treat his osteomyelitis. The plantar wound still probes quite deeply, approaching bone. The orifice has  narrowed quite substantially, however, making it more difficult for him to pack. 08/05/2021: The tunnel connecting the lateral foot wound to the plantar foot wound has reopened. He sees infectious disease tomorrow to discuss long-term antibiotic treatment for osteomyelitis. There was a bit of murky drainage in the wound, but this was noted after he had had topical lidocaine applied so may have just been a blob of the anesthetic. No odor or frank purulent drainage. The wound probes deeply at the midfoot approaching bone. He does have MRI results confirming his diagnosis of osteomyelitis. He has failed to progress with conventional treatment and I think his  best chance for preservation of the foot is to initiate hyperbaric oxygen therapy. 08/13/2021: The tunnel connecting the 2 wounds has closed again. He has a PICC line and is getting IV daptomycin and cefepime. EKG and chest x-ray are within normal limits. He is scheduled to start hyperbaric oxygen therapy tomorrow. The wound in his midfoot probes deeply, approaching bone. The skin at the orifice continues to heaped up and threatens to close over despite the large cavity within. No erythema, induration, or purulent drainage. The wound on his lateral foot is fairly small and quite clean. 08/19/2021: The lateral foot wound has nearly closed. The wound in his midfoot does not probe quite as deeply today. The skin at the orifice continues to try to roll in and obscure the opening. No frankly necrotic tissue appreciated. He is tolerating hyperbaric oxygen therapy. 08/26/2021: The lateral foot wound has closed completely. The wound in his midfoot is shallower again today. The wound orifice is contracting. We are using gentamicin and silver alginate. He continues on IV daptomycin and cefepime and is tolerating hyperbaric oxygen therapy without difficulty. 09/02/2021: His foot is a little bit sore today but he was up walking on it all weekend doing back to school  shopping with his daughter. The tunneling is down to about 3 cm and I do not appreciate bone at the tip of the probe. The wound is clean but has some callus creating an overhanging lip at the distal aspect. He continues to receive hyperbaric oxygen therapy as well as IV daptomycin and cefepime. 09/09/2021: His wound continues to improve. The tunnel has come in by 0.9 cm. He continues to form callus around the orifice of the wound. He is tolerating hyperbaric oxygen therapy along with his IV antibiotics. 09/16/2021: The tunneling is down to just half a centimeter. He continues to build up callus around the orifice of the wound. He completed his course of IV antibiotics and his PICC line is scheduled to be removed this Friday. He is tolerating hyperbaric oxygen without difficulty. We have been applying topical gentamicin and silver alginate to his wound. 09/24/2021: The wound depth has come in even further. There is a little bit of callus buildup around the orifice. The opening is quite narrow, at this point. 09/30/2021: The wound depth is about the same this week. The opening continues to contract with callus accumulation. There is some discoloration of the skin on the lateral part of his foot and he admitted to walking more than usual this weekend and also wearing a different pair shoes. He continues to tolerate hyperbaric oxygen therapy without difficulty. 10/07/2021: The wound depth has contracted a bit and is now approximately 1 to 1.5 cm. The orifice of the wound continues to try and close in over the space. There is just some callus and skin around the margins obscuring the orifice. Patient History Information obtained from Patient. Social History Never smoker, Marital Status - Married, Alcohol Use - Never, Drug Use - No History, Caffeine Use - Daily - T coffee. ea; Medical History Endocrine Patient has history of Type II Diabetes Hospitalization/Surgery History - Amuptation of 3rd,4th and 5th  toes of Right foot;Oral Surgery;Anal Fissure surgery; Cholecystectomy. Objective Constitutional Hypertensive, asymptomatic. No acute distress.. Vitals Time Taken: 7:40 AM, Height: 72 in, Weight: 312 lbs, BMI: 42.3, Temperature: 98.5 F, Pulse: 98 bpm, Respiratory Rate: 16 breaths/min, Blood Pressure: 152/84 mmHg. General Notes: 10/07/2021: The wound depth has contracted a bit and is now approximately 1 to 1.5 cm. The  orifice of the wound continues to try and close in over the space. There is just some callus and skin around the margins obscuring the orifice. Integumentary (Hair, Skin) Wound #2 status is Open. Original cause of wound was Gradually Appeared. The date acquired was: 12/19/2016. The wound has been in treatment 13 weeks. The wound is located on the [place]. The wound measures 0.1cm length x 1cm width x 1.5cm depth; 0.079cm^2 area and 0.118cm^3 volume. There is Fat Layer (Subcutaneous Tissue) exposed. There is no tunneling or undermining noted. There is a medium amount of serosanguineous drainage noted. The wound margin is thickened. There is large (67-100%) pink granulation within the wound bed. There is a small (1-33%) amount of necrotic tissue within the wound bed including Adherent Slough. 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 There was a Selective/Open Wound Skin/Epidermis Debridement with a total area of 0.1 sq cm performed by [name], MD. With the following instrument(s): Curette to remove Non-Viable tissue/material. Material removed includes Callus and  Skin: Epidermis and after achieving pain control using Lidocaine 4% Topical Solution. No specimens were taken. A time out was conducted at 07:48, prior to the start of the procedure. A Minimum amount of bleeding was controlled with Pressure. The procedure was tolerated well with a pain level of 0 throughout and a pain level of 0 following the procedure. Post Debridement Measurements: 0.1cm length x 1cm width x 1.5cm depth; 0.118cm^3 volume. Character of Wound/Ulcer Post Debridement is improved. Severity of Tissue Post Debridement is: Fat layer exposed. Post procedure Diagnosis Wound #2: Same as Pre-Procedure General Notes: scribed for [name] by Adline Peals, RN. Plan Follow-up Appointments: Return Appointment in 1 week. - Dr Celine Ahr Room 2 Anesthetic: Wound #2 Right,Lateral,Plantar Foot: (In clinic) Topical Lidocaine 4% applied to wound bed Bathing/ Shower/ Hygiene: Other Bathing/Shower/Hygiene Orders/Instructions: - Change dressing after bathing Off-Loading: Other: - Try to not stand on your feet too much Hyperbaric Oxygen Therapy: Evaluate for HBO Therapy Indication: - wagner grade 3 diabetic foot ulcer right foot 2.0 ATA for 90 Minutes without Air Breaks T Number of Treatments: - 40 more treatments for a total of 80 09/30/21 otal One treatments per day (delivered Monday through Friday unless otherwise specified in Special Instructions below): Finger stick Blood Glucose Pre- and Post- HBOT Treatment. Follow Hyperbaric Oxygen Glycemia Protocol Afrin (Oxymetazoline HCL) 0.05% nasal spray - 1 spray in both nostrils daily as needed prior to HBO treatment for difficulty clearing ears The following medication(s) was prescribed: lidocaine topical 4 % cream cream topical was prescribed at facility WOUND #2: - Foot Wound Laterality: Plantar, Right, Lateral Cleanser: Soap and Water Every Other Day/15 Days Discharge Instructions: May shower and wash wound with dial antibacterial soap and  water prior to dressing change. Cleanser: Wound Cleanser (Generic) Every Other Day/15 Days Discharge Instructions: Cleanse the wound with wound cleanser prior to applying a clean dressing using gauze sponges, not tissue or cotton balls. Peri-Wound Care: cotton tipped applicators (Generic) Every Other Day/15 Days Topical: Gentamicin Every Other Day/15 Days Discharge Instructions: As directed by physician Prim Dressing: Iodoform packing strip 1/4 (in) Every Other Day/15 Days ary Discharge Instructions: Lightly pack as instructed  Secondary Dressing: Woven Gauze Sponge, Non-Sterile 4x4 in Every Other Day/15 Days Discharge Instructions: Apply over primary dressing as directed. Secured With: Paper T ape, 2x10 (in/yd) (Generic) Every Other Day/15 Days Discharge Instructions: Secure dressing with tape as directed. Com pression Wrap: Kerlix Roll 4.5x3.1 (in/yd) (Generic) Every Other Day/15 Days Discharge Instructions: Apply Kerlix and Coban compression as directed. 10/07/2021: The wound depth has contracted a bit and is now approximately 1 to 1.5 cm. The orifice of the wound continues to try and close in over the space. There is just some callus and skin around the margins obscuring the orifice. I used a curette to debride the callus and skin that are overhanging the wound orifice. He is finding it difficult to pack the silver alginate into his wound so I am going to change to quarter inch packing strips. He will continue to use the gentamicin. He is showing ongoing progress with his wound healing while undergoing hyperbaric oxygen therapy. We will continue these treatments. Follow-up in 1 week. Electronic Signature(s) Signed: 10/07/2021 12:49:39 PM By: [name] MD FACS Signed: 10/07/2021 5:25:08 PM By: Adline Peals Previous Signature: 10/07/2021 8:05:22 AM Version By: [name] MD FACS Entered By: Adline Peals on 10/07/2021  10:32:25 -------------------------------------------------------------------------------- HxROS Details Patient Name: Date of Service: [name], [name] 10/07/2021 7:45 A M Medical Record Number: [id number] Patient Account Number: [id number] Date of Birth/Sex: Treating RN: 1970-04-06 (51 y.o. [name] Primary Care Provider: [name] Other Clinician: Referring Provider: Treating Provider/Extender: [name] in Treatment: 13 Information Obtained From Patient Endocrine Medical History: Positive for: Type II Diabetes Immunizations Pneumococcal Vaccine: Received Pneumococcal Vaccination: Yes Received Pneumococcal Vaccination On or After 60th Birthday: No Implantable Devices None Hospitalization / Surgery History Type of Hospitalization/Surgery Amuptation of 3rd,4th and 5th toes of Right foot;Oral Surgery;Anal Fissure surgery; Cholecystectomy Family and Social History Never smoker; Marital Status - Married; Alcohol Use: Never; Drug Use: No History; Caffeine Use: Daily - T coffee; Financial Concerns: No; Food, ea; Clothing or Shelter Needs: No; Support System Lacking: No; Transportation Concerns: No Electronic Signature(s) Signed: 10/07/2021 8:27:13 AM By: [name] MD FACS Signed: 10/07/2021 5:25:08 PM By: Adline Peals Entered By: [name] on 10/07/2021 08:03:46 -------------------------------------------------------------------------------- SuperBill Details Patient Name: Date of Service: [name], [name] 10/07/2021 Medical Record Number: [id number] Patient Account Number: [id number] Date of Birth/Sex: Treating RN: Oct 10, 1970 (51 y.o. [name] Primary Care Provider: [name] Other Clinician: Referring Provider: Treating Provider/Extender: [name] in Treatment: 13 Diagnosis Coding ICD-10 Codes Code Description E11.49 Type 2 diabetes mellitus with other  diabetic neurological complication C58.850 Other chronic osteomyelitis, right ankle and foot L97.514 Non-pressure chronic ulcer of other part of right foot with necrosis of bone Facility Procedures CPT4 Code: [id number] Description: [phone] - DEBRIDE WOUND 1ST 20 SQ CM OR < ICD-10 Diagnosis Description L97.514 Non-pressure chronic ulcer of other part of right foot with necrosis of bone Modifier: Quantity: 1 Physician Procedures : CPT4 Code Description Modifier [id number] - WC PHYS LEVEL 4 - EST PT 25 ICD-10 Diagnosis Description L97.514 Non-pressure chronic ulcer of other part of right foot with necrosis of bone M86.671 Other chronic osteomyelitis, right ankle and foot  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 Quantity: 1 : [phone] - WC PHYS DEBR WO ANESTH 20 SQ CM ICD-10 Diagnosis Description L97.514 Non-pressure chronic ulcer of other part of right foot with necrosis of bone Quantity: 1 Electronic Signature(s) Signed: 10/07/2021  8:08:48 AM By: [name] MD FACS Entered By: [name] on 10/07/2021 11:46:43

## 2021-10-07 NOTE — Progress Notes (Addendum)
[name], [name] ([id number]) Visit Report for 10/07/2021 HBO Details Patient Name: Date of Service: [name], [place] [name] 10/07/2021 8:30 A M Medical Record Number: [id number] Patient Account Number: [id number] Date of Birth/Sex: Treating RN: Jul 15, 1970 [name], [name] Primary Care Lauralye Kinn: [name] Other Clinician: [name] Referring Damarea Merkel: Treating Kaycee Haycraft/Extender: [name] in Treatment: 13 HBO Treatment Course Details Treatment Course Number: 1 Ordering Terrell Ostrand: [name] T Treatments Ordered: otal 80 HBO Treatment Start Date: 08/14/2021 HBO Indication: Diabetic Ulcer(s) of the Lower Extremity Standard/Conservative Wound Care tried and failed greater than or equal to 30 days HBO Treatment Details Treatment Number: 38 Patient Type: Outpatient Chamber Type: Monoplace Chamber Serial #: [id number] Treatment Protocol: 2.0 ATA with 90 minutes oxygen, and no air breaks Treatment Details Compression Rate Down: 2.0 psi / minute De-Compression Rate Up: 2.0 psi / minute Air breaks and breathing Decompress Decompress Compress Tx Pressure Begins Reached periods Begins Ends (leave unused spaces blank) Chamber Pressure (ATA 1 2 ------2 1 ) Clock Time (24 hr) 08:19 08:29 - - - - - - 09:59 10:07 Treatment Length: 108 (minutes) Treatment Segments: 4 Vital Signs Capillary Blood Glucose Reference Range: 80 - 120 mg / dl HBO Diabetic Blood Glucose Intervention Range: <131 mg/dl or >249 mg/dl Time Vitals Blood Respiratory Capillary Blood Glucose Pulse Action Type: Pulse: Temperature: Taken: Pressure: Rate: Glucose (mg/dl): Meter #: Oximetry (%) Taken: Pre 08:12 140/78 99 16 98.4 142 Post 10:12 129/78 79 18 97.2 132 Treatment Response Treatment Toleration: Well Treatment Completion Status: Treatment Completed without Adverse Event Additional Procedure Documentation Tissue Sevierity: Necrosis of bone Physician HBO  Attestation: I certify that I supervised this HBO treatment in accordance with Medicare guidelines. A trained emergency response team is readily available per Yes hospital policies and procedures. Continue HBOT as ordered. Yes Electronic Signature(s) Signed: 10/07/2021 3:53:44 PM By: [name] DO Previous Signature: 10/07/2021 2:41:33 PM Version By: [name] EMT Entered By: [name] on 10/07/2021 15:33:47 -------------------------------------------------------------------------------- HBO Safety Checklist Details Patient Name: Date of Service: [name], [name] 10/07/2021 8:30 A M Medical Record Number: [id number] Patient Account Number: [id number] Date of Birth/Sex: Treating RN: 20-Jun-1970 [name], [name] Primary Care Aslyn Cottman: [name] Other Clinician: [name] Referring Zachary Lovins: Treating Promyse Ardito/Extender: [name] in Treatment: 13 HBO Safety Checklist Items Safety Checklist Consent Form Signed Patient voided / foley secured and emptied When did you last eato 0635 Last dose of injectable or oral agent 0645 Ostomy pouch emptied and vented if applicable NA All implantable devices assessed, documented and approved NA Intravenous access site secured and place NA Valuables secured Linens and cotton and cotton/polyester blend (less than 51% polyester) Personal oil-based products / skin lotions / body lotions removed Wigs or hairpieces removed NA Smoking or tobacco materials removed Books / newspapers / magazines / loose paper removed Cologne, aftershave, perfume and deodorant removed Jewelry removed (may wrap wedding band) Make-up removed NA Hair care products removed Battery operated devices (external) removed Heating patches and chemical warmers removed NA Titanium eyewear removed NA Nail polish cured greater than 10 hours NA Casting material cured greater than 10 hours NA Hearing aids  removed NA Loose dentures or partials removed NA Prosthetics have been removed NA Patient demonstrates correct use of air break device (if applicable) Patient concerns have been addressed Patient grounding bracelet on and cord attached to chamber Specifics for Inpatients (complete in addition to above) Medication sheet sent with patient NA  Intravenous medications needed or due during therapy sent with patient NA Drainage tubes (e.g. nasogastric tube or chest tube secured and vented) NA Endotracheal or Tracheotomy tube secured NA Cuff deflated of air and inflated with saline NA Airway suctioned NA Notes The safety checklist was done before the treatment was started. Electronic Signature(s) Signed: 10/07/2021 2:40:19 PM By: [name] EMT Entered By: [name] on 10/07/2021 14:40:19

## 2021-10-07 NOTE — Progress Notes (Signed)
[name], Arlene M. ([id number]) Visit Report for 10/07/2021 Arrival Information Details Patient Name: Date of Service: [place], [place] [name] 10/07/2021 7:45 A M Medical Record Number: [id number] Patient Account Number: [id number] Date of Birth/Sex: Treating RN: 1970-03-08 (51 y.o. [name] Primary Care Denney Shein: [name] Other Clinician: Referring Shelbee Apgar: Treating Garlan Drewes/Extender: [name] in Treatment: 53 Visit Information History Since Last Visit Added or deleted any medications: No Patient Arrived: Ambulatory Any new allergies or adverse reactions: No Arrival Time: 07:38 Had a fall or experienced change in No Accompanied By: self activities of daily living that may affect Transfer Assistance: None risk of falls: Patient Identification Verified: Yes Signs or symptoms of abuse/neglect since last visito No Secondary Verification Process Completed: Yes Hospitalized since last visit: No Patient Requires Transmission-Based Precautions: No Implantable device outside of the clinic excluding No Patient Has Alerts: Yes cellular tissue based products placed in the center since last visit: Has Dressing in Place as Prescribed: Yes Pain Present Now: No Electronic Signature(s) Signed: 10/07/2021 5:25:08 PM By: Adline Peals Entered By: Adline Peals on 10/07/2021 07:40:22 -------------------------------------------------------------------------------- Encounter Discharge Information Details Patient Name: Date of Service: [name], [name] 10/07/2021 7:45 A M Medical Record Number: [id number] Patient Account Number: [id number] Date of Birth/Sex: Treating RN: 04-25-1970 (51 y.o. [name] Primary Care Vivion Romano: [name] Other Clinician: Referring Panda Crossin: Treating Ravan Schlemmer/Extender: [name] in Treatment: 13 Encounter Discharge Information Items Post Procedure Vitals Discharge  Condition: Stable Temperature (F): 98.5 Ambulatory Status: Ambulatory Pulse (bpm): 98 Discharge Destination: Home Respiratory Rate (breaths/min): 16 Transportation: Private Auto Blood Pressure (mmHg): 152/84 Accompanied By: self Schedule Follow-up Appointment: Yes Clinical Summary of Care: Patient Declined Electronic Signature(s) Signed: 10/07/2021 5:25:08 PM By: Adline Peals Entered By: Adline Peals on 10/07/2021 08:10:21 -------------------------------------------------------------------------------- Lower Extremity Assessment Details Patient Name: Date of Service: [name], [name] 10/07/2021 7:45 A M Medical Record Number: [id number] Patient Account Number: [id number] Date of Birth/Sex: Treating RN: 1970/07/13 (51 y.o. [name] Primary Care Dishawn Bhargava: [name] Other Clinician: Referring Talin Feister: Treating Pressley Tadesse/Extender: [name] in Treatment: 13 Edema Assessment Assessed: [name]: No] [name]: No] Edema: [Left: N] [Right: o] Calf Left: Right: Point of Measurement: From Medial Instep 38.4 cm Ankle Left: Right: Point of Measurement: From Medial Instep 23 cm Vascular Assessment Pulses: Dorsalis Pedis Palpable: [Right:Yes] Electronic Signature(s) Signed: 10/07/2021 5:25:08 PM By: Adline Peals Entered By: Adline Peals on 10/07/2021 07:42:11 -------------------------------------------------------------------------------- Multi Wound Chart Details Patient Name: Date of Service: [name], [name] 10/07/2021 7:45 A M Medical Record Number: [id number] Patient Account Number: [id number] Date of Birth/Sex: Treating RN: 06-01-1970 (51 y.o. [name] Primary Care Morgane Joerger: [name] Other Clinician: Referring Giorgio Chabot: Treating Talesha Ellithorpe/Extender: [name] in Treatment: 13 Vital Signs Height(in): 72 Pulse(bpm): 98 Weight(lbs): 312 Blood  Pressure(mmHg): 152/84 Body Mass Index(BMI): 42.3 Temperature(F): 98.5 Respiratory Rate(breaths/min): 16 Photos: [2:Right, Lateral, Plantar Foot] [N/A:N/A N/A] Wound Location: [2:Gradually Appeared] [N/A:N/A] Wounding Event: [2:Diabetic Wound/Ulcer of the Lower] [N/A:N/A] Primary Etiology: [2:Extremity Type II Diabetes] [N/A:N/A] Comorbid History: [2:12/19/2016] [N/A:N/A] Date Acquired: [2:13] [N/A:N/A] Weeks of Treatment: [2:Open] [N/A:N/A] Wound Status: [2:No] [N/A:N/A] Wound Recurrence: [2:0.1x1x1.5] [N/A:N/A] Measurements L x W x D (cm) [2:0.079] [N/A:N/A] A (cm) : rea [2:0.118] [N/A:N/A] Volume (cm) : [2:97.80%] [N/A:N/A] % Reduction in A [2:rea: 99.30%] [N/A:N/A] % Reduction in Volume: [2:Grade 3] [N/A:N/A] Classification: [2:Medium] [N/A:N/A] Exudate A mount: [2:Serosanguineous] [N/A:N/A] Exudate Type: [2:red, brown] [N/A:N/A] Exudate Color: [2:Thickened] [  N/A:N/A] Wound Margin: [2:Large (67-100%)] [N/A:N/A] Granulation A mount: [2:Pink] [N/A:N/A] Granulation Quality: [2:Small (1-33%)] [N/A:N/A] Necrotic A mount: [2:Fat Layer (Subcutaneous Tissue): Yes N/A] Exposed Structures: [2:Fascia: No Tendon: No Muscle: No Joint: No Bone: No Small (1-33%)] [N/A:N/A] Epithelialization: [2:Debridement - Selective/Open Wound N/A] Debridement: Pre-procedure Verification/Time Out 07:48 [N/A:N/A] Taken: [2:Lidocaine 4% Topical Solution] [N/A:N/A] Pain Control: [2:Callus] [N/A:N/A] Tissue Debrided: [2:Skin/Epidermis] [N/A:N/A] Level: [2:0.1] [N/A:N/A] Debridement A (sq cm): [2:rea Curette] [N/A:N/A] Instrument: [2:Minimum] [N/A:N/A] Bleeding: [2:Pressure] [N/A:N/A] Hemostasis A chieved: [2:0] [N/A:N/A] Procedural Pain: [2:0] [N/A:N/A] Post Procedural Pain: [2:Procedure was tolerated well] [N/A:N/A] Debridement Treatment Response: [2:0.1x1x1.5] [N/A:N/A] Post Debridement Measurements L x W x D (cm) [2:0.118] [N/A:N/A] Post Debridement Volume: (cm) [2:Debridement]  [N/A:N/A] Treatment Notes Electronic Signature(s) Signed: 10/07/2021 7:53:04 AM By: [name] MD FACS Signed: 10/07/2021 5:25:08 PM By: Adline Peals Entered By: [name] on 10/07/2021 07:53:04 -------------------------------------------------------------------------------- Multi-Disciplinary Care Plan Details Patient Name: Date of Service: [name], [name] 10/07/2021 7:45 A M Medical Record Number: [id number] Patient Account Number: [id number] Date of Birth/Sex: Treating RN: 02/11/1970 (51 y.o. [name] Primary Care Jahmeek Shirk: [name] Other Clinician: Referring Remer Couse: Treating Magdelena Kinsella/Extender: [name] in Treatment: [place] reviewed with physician Active Inactive Nutrition Nursing Diagnoses: Impaired glucose control: actual or potential Potential for alteratiion in Nutrition/Potential for imbalanced nutrition Goals: Patient/caregiver will maintain therapeutic glucose control Date Initiated: 07/29/2021 Target Resolution Date: 11/19/2021 Goal Status: Active Interventions: Assess patient nutrition upon admission and as needed per policy Provide education on elevated blood sugars and impact on wound healing Treatment Activities: Patient referred to Primary Care Physician for further nutritional evaluation : 07/29/2021 Notes: Osteomyelitis Nursing Diagnoses: Infection: osteomyelitis Knowledge deficit related to disease process and management Goals: Patient/caregiver will verbalize understanding of disease process and disease management Date Initiated: 07/29/2021 Target Resolution Date: 11/19/2021 Goal Status: Active Patient's osteomyelitis will resolve Date Initiated: 07/29/2021 Target Resolution Date: 11/19/2021 Goal Status: Active Interventions: Assess for signs and symptoms of osteomyelitis resolution every visit Provide education on osteomyelitis Treatment  Activities: Consult for HBO : 07/29/2021 Notes: Wound/Skin Impairment Nursing Diagnoses: Impaired tissue integrity Goals: Patient/caregiver will verbalize understanding of skin care regimen Date Initiated: 07/04/2021 Target Resolution Date: 11/19/2021 Goal Status: Active Interventions: Assess ulceration(s) every visit Treatment Activities: Skin care regimen initiated : 07/04/2021 Notes: Electronic Signature(s) Signed: 10/07/2021 5:25:08 PM By: Adline Peals Entered By: Adline Peals on 10/07/2021 07:48:01 -------------------------------------------------------------------------------- Pain Assessment Details Patient Name: Date of Service: [name], [name] 10/07/2021 7:45 A M Medical Record Number: [id number] Patient Account Number: [id number] Date of Birth/Sex: Treating RN: July 01, 1970 (51 y.o. [name] Primary Care Jazzelle Zhang: [name] Other Clinician: Referring Avant Printy: Treating Kennett Symes/Extender: [name] in Treatment: 13 Active Problems Location of Pain Severity and Description of Pain Patient Has Paino No Site Locations Rate the pain. Current Pain Level: 0 Pain Management and Medication Current Pain Management: Electronic Signature(s) Signed: 10/07/2021 5:25:08 PM By: Adline Peals Entered By: Adline Peals on 10/07/2021 07:40:53 -------------------------------------------------------------------------------- Patient/Caregiver Education Details Patient Name: Date of Service: [place], [name] 9/18/2023andnbsp7:45 A M Medical Record Number: [id number] Patient Account Number: [id number] Date of Birth/Gender: Treating RN: [birth year] (51 y.o. [name] Primary Care Physician: [name] Other Clinician: Referring Physician: Treating Physician/Extender: [name] in Treatment: 13 Education Assessment Education Provided To: Patient Education Topics  Provided Wound/Skin Impairment: Methods: Explain/Verbal Responses: Reinforcements needed, State content correctly [employer]) Signed: 10/07/2021 5:25:08 PM By: Sabas Sous  By: Adline Peals on 10/07/2021 07:48:10 -------------------------------------------------------------------------------- Wound Assessment Details Patient Name: Date of Service: [name], [name] 10/07/2021 7:45 A M Medical Record Number: [id number] Patient Account Number: [id number] Date of Birth/Sex: Treating RN: February 08, 1970 (51 y.o. [name] Primary Care Alyce Inscore: [name] Other Clinician: Referring Krysti Hickling: Treating Bri Wakeman/Extender: [name] in Treatment: 13 Wound Status Wound Number: 2 Primary Etiology: Diabetic Wound/Ulcer of the Lower Extremity Wound Location: Right, Lateral, Plantar Foot Wound Status: Open Wounding Event: Gradually Appeared Comorbid History: Type II Diabetes Date Acquired: 12/19/2016 Weeks Of Treatment: 13 Clustered Wound: No Photos Wound Measurements Length: (cm) 0.1 Width: (cm) 1 Depth: (cm) 1.5 Area: (cm) 0.079 Volume: (cm) 0.118 % Reduction in Area: 97.8% % Reduction in Volume: 99.3% Epithelialization: Small (1-33%) Tunneling: No Undermining: No Wound Description Classification: Grade 3 Wound Margin: Thickened Exudate Amount: Medium Exudate Type: Serosanguineous Exudate Color: red, brown Foul Odor After Cleansing: No Slough/Fibrino Yes Wound Bed Granulation Amount: Large (67-100%) Exposed Structure Granulation Quality: Pink Fascia Exposed: No Necrotic Amount: Small (1-33%) Fat Layer (Subcutaneous Tissue) Exposed: Yes Necrotic Quality: Adherent Slough Tendon Exposed: No Muscle Exposed: No Joint Exposed: No Bone Exposed: No Treatment Notes Wound #2 (Foot) Wound Laterality: Plantar, Right, Lateral Cleanser Soap and Water Discharge Instruction: May shower and wash wound with  dial antibacterial soap and water prior to dressing change. Wound Cleanser Discharge Instruction: Cleanse the wound with wound cleanser prior to applying a clean dressing using gauze sponges, not tissue or cotton balls. Peri-Wound Care cotton tipped applicators Topical Gentamicin Discharge Instruction: As directed by physician Primary Dressing Iodoform packing strip 1/4 (in) Discharge Instruction: Lightly pack as instructed Secondary Dressing Woven Gauze Sponge, Non-Sterile 4x4 in Discharge Instruction: Apply over primary dressing as directed. Secured With Paper Tape, 2x10 (in/yd) Discharge Instruction: Secure dressing with tape as directed. Compression Wrap Kerlix Roll 4.5x3.1 (in/yd) Discharge Instruction: Apply Kerlix and Coban compression as directed. Compression Stockings Add-Ons Electronic Signature(s) Signed: 10/07/2021 5:25:08 PM By: Adline Peals Entered By: Adline Peals on 10/07/2021 07:44:51 -------------------------------------------------------------------------------- Vitals Details Patient Name: Date of Service: [name], [name] 10/07/2021 7:45 A M Medical Record Number: [id number] Patient Account Number: [id number] Date of Birth/Sex: Treating RN: 1970-04-30 (51 y.o. [name] Primary Care Tayona Sarnowski: [name] Other Clinician: Referring Lynann Demetrius: Treating Arbell Wycoff/Extender: [name] in Treatment: 13 Vital Signs Time Taken: 07:40 Temperature (F): 98.5 Height (in): 72 Pulse (bpm): 98 Weight (lbs): 312 Respiratory Rate (breaths/min): 16 Body Mass Index (BMI): 42.3 Blood Pressure (mmHg): 152/84 Reference Range: 80 - 120 mg / dl Electronic Signature(s) Signed: 10/07/2021 5:25:08 PM By: Adline Peals Entered By: Adline Peals on 10/07/2021 07:40:45

## 2021-10-07 NOTE — Progress Notes (Signed)
[name], [name] ([id number]) Visit Report for 10/07/2021 Problem List Details Patient Name: Date of Service: [name], [place] [name] 10/07/2021 8:30 A M Medical Record Number: [id number] Patient Account Number: [id number] Date of Birth/Sex: Treating RN: 1970-02-21 (51 y.o. 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 10/07/2021 Medical Record Number: [id number] Patient Account Number: [id number] Date of Birth/Sex: Treating RN: [birth year] (51 y.o. 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 LP71.062Non-pressure chronic ulcer of other part of right foot with necrosis  of bone M86.671 Other chronic osteomyelitis, right ankle and foot Modifier: Quantity: 4 Physician Procedures : CPT4 Code Description Modifier [id number] 27035- WC PHYS HYPERBARIC OXYGEN THERAPY ICD-10 Diagnosis Description 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

## 2021-10-07 NOTE — Progress Notes (Addendum)
[name], [name] ([id number]) Visit Report for 10/07/2021 Arrival Information Details Patient Name: Date of Service: [name], [name] 10/07/2021 8:30 A M Medical Record Number: [id number] Patient Account Number: [id number] Date of Birth/Sex: Treating RN: [birth year] [name] Primary Care Genola Yuille: [name] Other Clinician: [name] Referring Donyell Ding: Treating Alese Furniss/Extender: [name] in Treatment: 45 Visit Information History Since Last Visit All ordered tests and consults were completed: Yes Patient Arrived: Ambulatory Added or deleted any medications: No Arrival Time: 07:36 Any new allergies or adverse reactions: No Accompanied By: None Had a fall or experienced change in No Transfer Assistance: None activities of daily living that may affect Patient Identification Verified: Yes risk of falls: Secondary Verification Process Completed: Yes Signs or symptoms of abuse/neglect since last visito No Patient Requires Transmission-Based Precautions: No Hospitalized since last visit: No Patient Has Alerts: Yes Implantable device outside of the clinic excluding No cellular tissue based products placed in the center since last visit: Pain Present Now: No Electronic Signature(s) Signed: 10/07/2021 2:38:20 PM By: [name] EMT Entered By: [name] on 10/07/2021 14:38:20 -------------------------------------------------------------------------------- Encounter Discharge Information Details Patient Name: Date of Service: [name], [name] 10/07/2021 8:30 A M Medical Record Number: [id number] Patient Account Number: [id number] Date of Birth/Sex: Treating RN: [birth year] [name] Primary Care [name]: [name] Other Clinician: [name] Referring Sawyer Kahan: Treating Dionicio Shelnutt/Extender: [name] in Treatment: 13 Encounter Discharge Information  Items Discharge Condition: Stable Ambulatory Status: Ambulatory Discharge Destination: Home Transportation: Private Auto Accompanied By: None Schedule Follow-up Appointment: Yes Clinical Summary of Care: Electronic Signature(s) Signed: 10/07/2021 2:42:32 PM By: [name] EMT Entered By: [name] on 10/07/2021 14:42:31 -------------------------------------------------------------------------------- Vitals Details Patient Name: Date of Service: [name], [name] 10/07/2021 8:30 A M Medical Record Number: [id number] Patient Account Number: [id number] Date of Birth/Sex: Treating RN: 11-21-1970 [name] Primary Care Hadas Jessop: [name] Other Clinician: [name] Referring Devontre Siedschlag: Treating Neria Procter/Extender: [name] in Treatment: 13 Vital Signs Time Taken: 08:12 Temperature (F): 98.4 Height (in): 72 Pulse (bpm): 99 Weight (lbs): 312 Respiratory Rate (breaths/min): 16 Body Mass Index (BMI): 42.3 Blood Pressure (mmHg): 140/78 Capillary Blood Glucose (mg/dl): 142 Reference Range: 80 - 120 mg / dl Electronic Signature(s) Signed: 10/07/2021 2:39:12 PM By: [name] EMT Entered By: [name] on 10/07/2021 14:39:11

## 2021-10-08 ENCOUNTER — Encounter (HOSPITAL_BASED_OUTPATIENT_CLINIC_OR_DEPARTMENT_OTHER): Payer: BLUE CROSS/BLUE SHIELD | Admitting: Internal Medicine

## 2021-10-08 DIAGNOSIS — E1149 Type 2 diabetes mellitus with other diabetic neurological complication: Secondary | ICD-10-CM

## 2021-10-08 DIAGNOSIS — M86671 Other chronic osteomyelitis, right ankle and foot: Secondary | ICD-10-CM | POA: Diagnosis not present

## 2021-10-08 DIAGNOSIS — L97514 Non-pressure chronic ulcer of other part of right foot with necrosis of bone: Secondary | ICD-10-CM

## 2021-10-08 DIAGNOSIS — E1169 Type 2 diabetes mellitus with other specified complication: Secondary | ICD-10-CM | POA: Diagnosis not present

## 2021-10-08 LAB — CBC
HCT: 36.9 % — ABNORMAL LOW (ref 38.5–50.0)
Hemoglobin: 12.2 g/dL — ABNORMAL LOW (ref 13.2–17.1)
MCH: 25.8 pg — ABNORMAL LOW (ref 27.0–33.0)
MCHC: 33.1 g/dL (ref 32.0–36.0)
MCV: 78 fL — ABNORMAL LOW (ref 80.0–100.0)
MPV: 9.9 fL (ref 7.5–12.5)
Platelets: 157 10*3/uL (ref 140–400)
RBC: 4.73 10*6/uL (ref 4.20–5.80)
RDW: 13.4 % (ref 11.0–15.0)
WBC: 9.6 10*3/uL (ref 3.8–10.8)

## 2021-10-08 LAB — COMPREHENSIVE METABOLIC PANEL
AG Ratio: 1.2 (calc) (ref 1.0–2.5)
ALT: 44 U/L (ref 9–46)
AST: 26 U/L (ref 10–35)
Albumin: 4.5 g/dL (ref 3.6–5.1)
Alkaline phosphatase (APISO): 79 U/L (ref 35–144)
BUN/Creatinine Ratio: 16 (calc) (ref 6–22)
BUN: 23 mg/dL (ref 7–25)
CO2: 27 mmol/L (ref 20–32)
Calcium: 10 mg/dL (ref 8.6–10.3)
Chloride: 103 mmol/L (ref 98–110)
Creat: 1.46 mg/dL — ABNORMAL HIGH (ref 0.70–1.30)
Globulin: 3.8 g/dL (calc) — ABNORMAL HIGH (ref 1.9–3.7)
Glucose, Bld: 100 mg/dL — ABNORMAL HIGH (ref 65–99)
Potassium: 4 mmol/L (ref 3.5–5.3)
Sodium: 139 mmol/L (ref 135–146)
Total Bilirubin: 1 mg/dL (ref 0.2–1.2)
Total Protein: 8.3 g/dL — ABNORMAL HIGH (ref 6.1–8.1)

## 2021-10-08 LAB — GLUCOSE, CAPILLARY
Glucose-Capillary: 149 mg/dL — ABNORMAL HIGH (ref 70–99)
Glucose-Capillary: 118 mg/dL — ABNORMAL HIGH (ref 70–99)

## 2021-10-08 LAB — SEDIMENTATION RATE: Sed Rate: 14 mm/h (ref 0–20)

## 2021-10-08 LAB — C-REACTIVE PROTEIN: CRP: 4.7 mg/L (ref <8.0)

## 2021-10-08 NOTE — Progress Notes (Signed)
[name], [name] ([id number]) Visit Report for 10/08/2021 Problem List Details Patient Name: Date of Service: [name], [name] 10/08/2021 8:00 A M Medical Record Number: [id number] Patient Account Number: [id number] Date of Birth/Sex: Treating RN: Apr 16, 1970 (51 y.o. 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 10/08/2021 Medical Record Number: [id number] Patient Account Number: [id number] Date of Birth/Sex: Treating RN: [birth year] (51 y.o. 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 LV94.801Non-pressure chronic ulcer of other part of right foot with necrosis  of bone M86.671 Other chronic osteomyelitis, right ankle and foot Modifier: Quantity: 4 Physician Procedures : CPT4 Code Description Modifier [id number] 27078- WC PHYS HYPERBARIC OXYGEN THERAPY ICD-10 Diagnosis Description 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

## 2021-10-08 NOTE — Progress Notes (Addendum)
[name], [name] ([id number]) Visit Report for 10/08/2021 HBO Details Patient Name: Date of Service: [name], [name] 10/08/2021 8:00 A M Medical Record Number: [id number] Patient Account Number: [id number] Date of Birth/Sex: Treating RN: 1970/10/07 [name] Primary Care Hridaan Bouse: [name] Other Clinician: [name] Referring Calena Salem: Treating Shellie Rogoff/Extender: [name] in Treatment: 13 HBO Treatment Course Details Treatment Course Number: 1 Ordering Dahiana Kulak: [name] T Treatments Ordered: otal 80 HBO Treatment Start Date: 08/14/2021 HBO Indication: Diabetic Ulcer(s) of the Lower Extremity Standard/Conservative Wound Care tried and failed greater than or equal to 30 days HBO Treatment Details Treatment Number: 39 Patient Type: Outpatient Chamber Type: Monoplace Chamber Serial #: [id number] Treatment Protocol: 2.0 ATA with 90 minutes oxygen, and no air breaks Treatment Details Compression Rate Down: 2.0 psi / minute De-Compression Rate Up: 2.0 psi / minute Air breaks and breathing Decompress Decompress Compress Tx Pressure Begins Reached periods Begins Ends (leave unused spaces blank) Chamber Pressure (ATA 1 2 ------2 1 ) Clock Time (24 hr) 08:17 08:26 - - - - - - 09:57 10:03 Treatment Length: 106 (minutes) Treatment Segments: 4 Vital Signs Capillary Blood Glucose Reference Range: 80 - 120 mg / dl HBO Diabetic Blood Glucose Intervention Range: <131 mg/dl or >249 mg/dl Time Vitals Blood Respiratory Capillary Blood Glucose Pulse Action Type: Pulse: Temperature: Taken: Pressure: Rate: Glucose (mg/dl): Meter #: Oximetry (%) Taken: Pre 08:07 123/79 91 16 97.9 149 Post 10:09 122/70 79 18 97.9 118 Treatment Response Treatment Toleration: Well Treatment Completion Status: Treatment Completed without Adverse Event Additional Procedure Documentation Tissue Sevierity: Necrosis of bone Physician HBO  Attestation: I certify that I supervised this HBO treatment in accordance with Medicare guidelines. A trained emergency response team is readily available per Yes hospital policies and procedures. Continue HBOT as ordered. Yes Electronic Signature(s) Signed: 10/08/2021 4:16:01 PM By: [name] DO Previous Signature: 10/08/2021 2:15:23 PM Version By: [name] EMT Entered By: [name] on 10/08/2021 15:36:18 -------------------------------------------------------------------------------- HBO Safety Checklist Details Patient Name: Date of Service: [name], [name] 10/08/2021 8:00 A M Medical Record Number: [id number] Patient Account Number: [id number] Date of Birth/Sex: Treating RN: [birth year] [name] Primary Care Zebulan Hinshaw: [name] Other Clinician: [name] Referring Katey Barrie: Treating Geoff Dacanay/Extender: [name] in Treatment: 13 HBO Safety Checklist Items Safety Checklist Consent Form Signed Patient voided / foley secured and emptied When did you last eato 0700 Last dose of injectable or oral agent 0705 Ostomy pouch emptied and vented if applicable NA All implantable devices assessed, documented and approved NA Intravenous access site secured and place NA Valuables secured Linens and cotton and cotton/polyester blend (less than 51% polyester) Personal oil-based products / skin lotions / body lotions removed Wigs or hairpieces removed NA Smoking or tobacco materials removed Books / newspapers / magazines / loose paper removed Cologne, aftershave, perfume and deodorant removed Jewelry removed (may wrap wedding band) NA Make-up removed NA Hair care products removed Battery operated devices (external) removed Heating patches and chemical warmers removed Titanium eyewear removed NA Nail polish cured greater than 10 hours NA Casting material cured greater than 10 hours NA Hearing aids  removed NA Loose dentures or partials removed NA Prosthetics have been removed NA Patient demonstrates correct use of air break device (if applicable) Patient concerns have been addressed Patient grounding bracelet on and cord attached to chamber Specifics for Inpatients (complete in addition to above) Medication sheet sent with patient NA  Intravenous medications needed or due during therapy sent with patient NA Drainage tubes (e.g. nasogastric tube or chest tube secured and vented) NA Endotracheal or Tracheotomy tube secured NA Cuff deflated of air and inflated with saline NA Airway suctioned NA Notes The safety checklist was done before the treatment was started. Electronic Signature(s) Signed: 10/08/2021 2:14:14 PM By: [name] EMT Entered By: [name] on 10/08/2021 14:14:14

## 2021-10-08 NOTE — Progress Notes (Addendum)
[name], Kelyn M. ([id number]) Visit Report for 10/08/2021 Arrival Information Details Patient Name: Date of Service: [place], [place] [name] 10/08/2021 8:00 A M Medical Record Number: [id number] Patient Account Number: [id number] Date of Birth/Sex: Treating RN: 05/10/1970 (51 y.o. Flossie Buffy, Lovena Le Primary Care Amea Mcphail: [name] Other Clinician: [name] Referring Chet Greenley: Treating Cherisa Brucker/Extender: [name] in Treatment: 60 Visit Information History Since Last Visit All ordered tests and consults were completed: Yes Patient Arrived: Ambulatory Added or deleted any medications: No Arrival Time: 07:39 Any new allergies or adverse reactions: No Accompanied By: None Had a fall or experienced change in No Transfer Assistance: None activities of daily living that may affect Patient Identification Verified: Yes risk of falls: Secondary Verification Process Completed: Yes Signs or symptoms of abuse/neglect since last visito No Patient Requires Transmission-Based Precautions: No Hospitalized since last visit: No Patient Has Alerts: Yes Implantable device outside of the clinic excluding No cellular tissue based products placed in the center since last visit: Pain Present Now: No Electronic Signature(s) Signed: 10/08/2021 2:12:34 PM By: [name] EMT Entered By: [name] on 10/08/2021 14:12:34 -------------------------------------------------------------------------------- Encounter Discharge Information Details Patient Name: Date of Service: [place], [name] 10/08/2021 8:00 A M Medical Record Number: [id number] Patient Account Number: [id number] Date of Birth/Sex: Treating RN: Oct 07, 1970 (51 y.o. [name] Primary Care Katalyna Socarras: [name] Other Clinician: [name] Referring Saragrace Selke: Treating Perrin Eddleman/Extender: [name] in Treatment: 13 Encounter Discharge Information  Items Discharge Condition: Stable Ambulatory Status: Ambulatory Discharge Destination: Home Transportation: Private Auto Accompanied By: None Schedule Follow-up Appointment: Yes Clinical Summary of Care: Electronic Signature(s) Signed: 10/08/2021 2:16:14 PM By: [name] EMT Entered By: [name] on 10/08/2021 14:16:14 -------------------------------------------------------------------------------- Vitals Details Patient Name: Date of Service: [place], [name] 10/08/2021 8:00 A M Medical Record Number: [id number] Patient Account Number: [id number] Date of Birth/Sex: Treating RN: October 24, 1970 (51 y.o. [name] Primary Care Geet Hosking: [name] Other Clinician: [name] Referring Lamoine Magallon: Treating Yoav Okane/Extender: [name] in Treatment: 13 Vital Signs Time Taken: 08:07 Temperature (F): 97.9 Height (in): 72 Pulse (bpm): 91 Weight (lbs): 312 Respiratory Rate (breaths/min): 16 Body Mass Index (BMI): 42.3 Blood Pressure (mmHg): 123/79 Capillary Blood Glucose (mg/dl): 149 Reference Range: 80 - 120 mg / dl Electronic Signature(s) Signed: 10/08/2021 2:13:03 PM By: [name] EMT Entered By: [name] on 10/08/2021 14:13:03

## 2021-10-09 ENCOUNTER — Encounter (HOSPITAL_BASED_OUTPATIENT_CLINIC_OR_DEPARTMENT_OTHER): Payer: BLUE CROSS/BLUE SHIELD | Admitting: General Surgery

## 2021-10-09 ENCOUNTER — Telehealth: Payer: Self-pay

## 2021-10-09 DIAGNOSIS — E1169 Type 2 diabetes mellitus with other specified complication: Secondary | ICD-10-CM | POA: Diagnosis not present

## 2021-10-09 LAB — GLUCOSE, CAPILLARY
Glucose-Capillary: 130 mg/dL — ABNORMAL HIGH (ref 70–99)
Glucose-Capillary: 155 mg/dL — ABNORMAL HIGH (ref 70–99)

## 2021-10-09 NOTE — Telephone Encounter (Signed)
-----   Message from Mignon Pine, DO sent at 10/09/2021  3:10 PM EDT ----- Can you please let patient know that his platelets are relatively stable from his labs 2 days ago.  I think we are okay to continue with current antibiotics of linezolid and levaquin.  However, I would like to continue monitoring his labs closely to ensure no toxicity develops.  Can he come in again for another CBC (ordered) early next week again on Tuesday or Wedneday?

## 2021-10-09 NOTE — Telephone Encounter (Signed)
Spoke with patient, relayed that platelets were relatively stable and that Dr. Juleen China would like for him to continue with linezolid and levaquin. He will come in 9/27 for repeat CBC.  Beryle Flock, RN

## 2021-10-09 NOTE — Progress Notes (Signed)
[name], [name] ([id number]) Visit Report for 10/09/2021 Arrival Information Details Patient Name: Date of Service: [name], [name] 10/09/2021 8:00 A M Medical Record Number: [id number] Patient Account Number: [id number] Date of Birth/Sex: Treating RN: 1970/09/19 [name], [name] Primary Care [name]: [name] Other Clinician: [name] Referring [name]: Treating [name]/Extender: [name] in Treatment: 13 Visit Information History Since Last Visit All ordered tests and consults were completed: Yes Patient Arrived: Ambulatory Added or deleted any medications: No Arrival Time: 07:39 Any new allergies or adverse reactions: No Accompanied By: None Had a fall or experienced change in No Transfer Assistance: None activities of daily living that may affect Patient Identification Verified: Yes risk of falls: Secondary Verification Process Completed: Yes Signs or symptoms of abuse/neglect since last visito No Patient Requires Transmission-Based Precautions: No Hospitalized since last visit: No Patient Has Alerts: Yes Implantable device outside of the clinic excluding No cellular tissue based products placed in the center since last visit: Pain Present Now: No Electronic Signature(s) Signed: 10/09/2021 1:04:38 PM By: [name] EMT Entered By: [name] on 10/09/2021 13:04:37 -------------------------------------------------------------------------------- Encounter Discharge Information Details Patient Name: Date of Service: [name], [name] 10/09/2021 8:00 A M Medical Record Number: [id number] Patient Account Number: [id number] Date of Birth/Sex: Treating RN: 29-Jul-1970 [name] Primary Care Dae Antonucci: [name] Other Clinician: [name] Referring Kadesha Virrueta: Treating Beretta Ginsberg/Extender: [name] in Treatment: 13 Encounter Discharge Information  Items Discharge Condition: Stable Ambulatory Status: Ambulatory Discharge Destination: Home Transportation: Private Auto Accompanied By: None Schedule Follow-up Appointment: Yes Clinical Summary of Care: Electronic Signature(s) Signed: 10/09/2021 1:16:13 PM By: [name] EMT Entered By: [name] on 10/09/2021 13:16:13 -------------------------------------------------------------------------------- Vitals Details Patient Name: Date of Service: [name], [name] 10/09/2021 8:00 A M Medical Record Number: [id number] Patient Account Number: [id number] Date of Birth/Sex: Treating RN: 25-Sep-1970 [name] Primary Care [name]: [name] Other Clinician: [name] Referring [place] [name]: Treating Kishawn Pickar/Extender: [name] in Treatment: 13 Vital Signs Time Taken: 08:10 Temperature (F): 98.8 Height (in): 72 Pulse (bpm): 99 Weight (lbs): 312 Respiratory Rate (breaths/min): 16 Body Mass Index (BMI): 42.3 Blood Pressure (mmHg): 120/80 Capillary Blood Glucose (mg/dl): 130 Reference Range: 80 - 120 mg / dl Electronic Signature(s) Signed: 10/09/2021 1:06:33 PM By: [name] EMT Entered By: [name] on 10/09/2021 13:06:33

## 2021-10-09 NOTE — Progress Notes (Signed)
[name], [name] ([id number]) Visit Report for 10/09/2021 Problem List Details Patient Name: Date of Service: [name], [name] 10/09/2021 8:00 A M Medical Record Number: [id number] Patient Account Number: [id number] Date of Birth/Sex: Treating RN: Jan 22, 1970 (51 y.o. 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 10/09/2021 Medical Record Number: [id number] Patient Account Number: [id number] Date of Birth/Sex: Treating RN: [birth year] (51 y.o. 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 LM62.863Non-pressure chronic ulcer of other part of right foot with necrosis  of bone M86.671 Other chronic osteomyelitis, right ankle and foot Modifier: Quantity: 4 Physician Procedures : CPT4 Code Description Modifier [id number] PHYS HYPERBARIC OXYGEN THERAPY ICD-10 Diagnosis Description 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

## 2021-10-09 NOTE — Progress Notes (Addendum)
[name], [name] ([id number]) Visit Report for 10/09/2021 HBO Details Patient Name: Date of Service: [name], [place] [name] 10/09/2021 8:00 A M Medical Record Number: [id number] Patient Account Number: [id number] Date of Birth/Sex: Treating RN: October 14, 1970 [name] Primary Care Delores Edelstein: [name] Other Clinician: [name] Referring [name] in Treatment: 13 HBO Treatment Course Details Treatment Course Number: 1 Ordering [name]: [name] T Treatments Ordered: otal 80 HBO Treatment Start Date: 08/14/2021 HBO Indication: Diabetic Ulcer(s) of the Lower Extremity Standard/Conservative Wound Care tried and failed greater than or equal to 30 days HBO Treatment Details Treatment Number: 40 Patient Type: Outpatient Chamber Type: Monoplace Chamber Serial #: [id number] Treatment Protocol: 2.0 ATA with 90 minutes oxygen, and no air breaks Treatment Details Compression Rate Down: 2.0 psi / minute De-Compression Rate Up: 2.0 psi / minute Air breaks and breathing Decompress Decompress Compress Tx Pressure Begins Reached periods Begins Ends (leave unused spaces blank) Chamber Pressure (ATA 1 2 ------2 1 ) Clock Time (24 hr) 08:21 08:29 - - - - - - 09:59 10:07 Treatment Length: 106 (minutes) Treatment Segments: 4 Vital Signs Capillary Blood Glucose Reference Range: 80 - 120 mg / dl HBO Diabetic Blood Glucose Intervention Range: <131 mg/dl or >249 mg/dl Type: Time Vitals Blood Pulse: Respiratory Capillary Blood Glucose Pulse Action Temperature: Taken: Pressure: Rate: Glucose (mg/dl): Meter #: Oximetry (%) Taken: Pre 08:10 120/80 99 16 98.8 130 Patient given 8 oz glucerna Post 10:11 112/100 82 16 97.3 155 Treatment Response Treatment Toleration: Well Treatment Completion Status: Treatment Completed without Adverse Event Treatment Notes I gave the patient 8oz of Glucerna. Spoke  with [name] ref the patient blood sugar. [name] stated that I could start treatment. Additional Procedure Documentation Tissue Sevierity: Necrosis of bone Physician HBO Attestation: I certify that I supervised this HBO treatment in accordance with Medicare guidelines. A trained emergency response team is readily available per Yes hospital policies and procedures. Continue HBOT as ordered. Yes Electronic Signature(s) Signed: 10/09/2021 1:56:53 PM By: [name] MD FACS Previous Signature: 10/09/2021 1:14:06 PM Version By: [name] EMT Entered By: [name] on 10/09/2021 13:56:53 -------------------------------------------------------------------------------- HBO Safety Checklist Details Patient Name: Date of Service: [name], [name] 10/09/2021 8:00 A M Medical Record Number: [id number] Patient Account Number: [id number] Date of Birth/Sex: Treating RN: Nov 09, 1970 [name] Primary Care [name]: [name] Other Clinician: [name] Referring [name]: Treating Tadhg Eskew/Extender: [name] in Treatment: 13 HBO Safety Checklist Items Safety Checklist Consent Form Signed Patient voided / foley secured and emptied When did you last eato 0650 Last dose of injectable or oral agent 0655 Ostomy pouch emptied and vented if applicable NA All implantable devices assessed, documented and approved NA Intravenous access site secured and place NA Valuables secured Linens and cotton and cotton/polyester blend (less than 51% polyester) Personal oil-based products / skin lotions / body lotions removed Wigs or hairpieces removed NA Smoking or tobacco materials removed Books / newspapers / magazines / loose paper removed Cologne, aftershave, perfume and deodorant removed Jewelry removed (may wrap wedding band) Make-up removed NA Hair care products removed Battery operated devices (external)  removed Heating patches and chemical warmers removed Titanium eyewear removed NA Nail polish cured greater than 10 hours NA Casting material cured greater than 10 hours NA Hearing aids removed NA Loose dentures or partials removed NA Prosthetics have been removed NA Patient demonstrates correct use of air break  device (if applicable) Patient concerns have been addressed Patient grounding bracelet on and cord attached to chamber Specifics for Inpatients (complete in addition to above) Medication sheet sent with patient NA Intravenous medications needed or due during therapy sent with patient NA Drainage tubes (e.g. nasogastric tube or chest tube secured and vented) NA Endotracheal or Tracheotomy tube secured NA Cuff deflated of air and inflated with saline NA Airway suctioned NA Notes The safety checklist was done before the treatment was started. Electronic Signature(s) Signed: 10/09/2021 1:07:38 PM By: [name] EMT Entered By: [name] on 10/09/2021 13:07:38

## 2021-10-10 ENCOUNTER — Encounter (HOSPITAL_BASED_OUTPATIENT_CLINIC_OR_DEPARTMENT_OTHER): Payer: BLUE CROSS/BLUE SHIELD | Admitting: General Surgery

## 2021-10-10 DIAGNOSIS — E1169 Type 2 diabetes mellitus with other specified complication: Secondary | ICD-10-CM | POA: Diagnosis not present

## 2021-10-10 LAB — GLUCOSE, CAPILLARY
Glucose-Capillary: 152 mg/dL — ABNORMAL HIGH (ref 70–99)
Glucose-Capillary: 113 mg/dL — ABNORMAL HIGH (ref 70–99)

## 2021-10-10 NOTE — Progress Notes (Addendum)
[name], [name] ([id number]) Visit Report for 10/10/2021 HBO Details Patient Name: Date of Service: [name], [name] 10/10/2021 8:00 A M Medical Record Number: [id number] Patient Account Number: [id number] Date of Birth/Sex: Treating RN: September 28, 1970 (51 y.o. [name] [name]: [name] Other Clinician: [name] Referring Rae Sutcliffe: Treating [name]/Extender: [name] in Treatment: 14 HBO Treatment Course Details Treatment Course Number: 1 Ordering [name]: [name] T Treatments Ordered: otal 80 HBO Treatment Start Date: 08/14/2021 HBO Indication: Diabetic Ulcer(s) of the Lower Extremity Standard/Conservative Wound Care tried and failed greater than or equal to 30 days HBO Treatment Details Treatment Number: 41 Patient Type: Outpatient Chamber Type: Monoplace Chamber Serial #: [id number] Treatment Protocol: 2.0 ATA with 90 minutes oxygen, and no air breaks Treatment Details Compression Rate Down: 2.0 psi / minute De-Compression Rate Up: 2.0 psi / minute Air breaks and breathing Decompress Decompress Compress Tx Pressure Begins Reached periods Begins Ends (leave unused spaces blank) Chamber Pressure (ATA 1 2 ------2 1 ) Clock Time (24 hr) 08:18 08:29 - - - - - - 09:59 10:07 Treatment Length: 109 (minutes) Treatment Segments: 4 Vital Signs Capillary Blood Glucose Reference Range: 80 - 120 mg / dl HBO Diabetic Blood Glucose Intervention Range: <131 mg/dl or >249 mg/dl Time Vitals Blood Respiratory Capillary Blood Glucose Pulse Action Type: Pulse: Temperature: Taken: Pressure: Rate: Glucose (mg/dl): Meter #: Oximetry (%) Taken: Pre 08:15 111/77 96 16 98.1 152 Post 10:12 117/74 77 16 97 173 Treatment Response Treatment Toleration: Well Treatment Completion Status: Treatment Completed without Adverse Event Additional Procedure Documentation Tissue Sevierity: Necrosis of bone Physician HBO  Attestation: I certify that I supervised this HBO treatment in accordance with Medicare guidelines. A trained emergency response team is readily available per Yes hospital policies and procedures. Continue HBOT as ordered. Yes Electronic Signature(s) Signed: 10/10/2021 3:01:20 PM By: [name] MD FACS Previous Signature: 10/10/2021 1:21:49 PM Version By: [name] EMT Entered By: [name] on 10/10/2021 15:01:19 -------------------------------------------------------------------------------- HBO Safety Checklist Details Patient Name: Date of Service: [name], [name] 10/10/2021 8:00 A M Medical Record Number: [id number] Patient Account Number: [id number] Date of Birth/Sex: Treating RN: Dec 31, 1970 (51 y.o. [name] Latiya [name]: [name] Other Clinician: [name] Referring [name]: Treating [name]/Extender: [name] in Treatment: 14 HBO Safety Checklist Items Safety Checklist Consent Form Signed Patient voided / foley secured and emptied When did you last eato 0635 Last dose of injectable or oral agent 0625 Ostomy pouch emptied and vented if applicable NA All implantable devices assessed, documented and approved NA Intravenous access site secured and place NA Valuables secured Linens and cotton and cotton/polyester blend (less than 51% polyester) Personal oil-based products / skin lotions / body lotions removed Wigs or hairpieces removed NA Smoking or tobacco materials removed Books / newspapers / magazines / loose paper removed Cologne, aftershave, perfume and deodorant removed Jewelry removed (may wrap wedding band) NA Make-up removed NA Hair care products removed Battery operated devices (external) removed Heating patches and chemical warmers removed Titanium eyewear removed NA Nail polish cured greater than 10 hours NA Casting material cured greater than 10 hours NA Hearing aids  removed NA Loose dentures or partials removed NA Prosthetics have been removed NA Patient demonstrates correct use of air break device (if applicable) Patient concerns have been addressed Patient grounding bracelet on and cord attached to chamber Specifics for Inpatients (complete in addition to above) Medication sheet sent with patient  NA Intravenous medications needed or due during therapy sent with patient NA Drainage tubes (e.g. nasogastric tube or chest tube secured and vented) NA Endotracheal or Tracheotomy tube secured NA Cuff deflated of air and inflated with saline NA Airway suctioned NA Notes The safety checklist was done before the treatment was started. Electronic Signature(s) Signed: 10/10/2021 1:20:45 PM By: [name] EMT Entered By: [name] on 10/10/2021 13:20:44

## 2021-10-10 NOTE — Progress Notes (Signed)
[name], [name] ([id number]) Visit Report for 10/10/2021 Problem List Details Patient Name: Date of Service: [name], [name] 10/10/2021 8:00 A M Medical Record Number: [id number] Patient Account Number: [id number] Date of Birth/Sex: Treating RN: [birth year] (50 y.o. 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 10/10/2021 Medical Record Number: [id number] Patient Account Number: [id number] Date of Birth/Sex: Treating RN: Oct 21, 1970 (51 y.o. 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 LU44.034Non-pressure chronic ulcer of other part of right foot with necrosis  of bone M86.671 Other chronic osteomyelitis, right ankle and foot Modifier: Quantity: 4 Physician Procedures : CPT4 Code Description Modifier [id number] PHYS HYPERBARIC OXYGEN THERAPY ICD-10 Diagnosis Description 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

## 2021-10-10 NOTE — Progress Notes (Signed)
[name], [name] ([id number]) Visit Report for 10/10/2021 Arrival Information Details Patient Name: Date of Service: [name], [name] 10/10/2021 8:00 A M Medical Record Number: [id number] Patient Account Number: [id number] Date of Birth/Sex: Treating RN: 1970-08-20 (51 y.o. [name], [place] Primary Care Alisha Bacus: [name] Other Clinician: [name] Referring Caridad Silveira: Treating Roiza Wiedel/Extender: [name] in Treatment: 14 Visit Information History Since Last Visit All ordered tests and consults were completed: Yes Patient Arrived: Ambulatory Added or deleted any medications: No Arrival Time: 07:45 Any new allergies or adverse reactions: No Accompanied By: None Had a fall or experienced change in No Transfer Assistance: None activities of daily living that may affect Patient Identification Verified: Yes risk of falls: Secondary Verification Process Completed: Yes Signs or symptoms of abuse/neglect since last visito No Patient Requires Transmission-Based Precautions: No Hospitalized since last visit: No Patient Has Alerts: Yes Implantable device outside of the clinic excluding No cellular tissue based products placed in the center since last visit: Pain Present Now: No Electronic Signature(s) Signed: 10/10/2021 1:17:39 PM By: [name] EMT Entered By: [name] on 10/10/2021 13:17:39 -------------------------------------------------------------------------------- Encounter Discharge Information Details Patient Name: Date of Service: [name], [name] 10/10/2021 8:00 A M Medical Record Number: [id number] Patient Account Number: [id number] Date of Birth/Sex: Treating RN: 02-17-1970 (51 y.o. [name] Jaxxon Naeem: [name] Other Clinician: [name] Referring Amiri Tritch: Treating Kostantinos Tallman/Extender: [name] in Treatment: 14 Encounter Discharge Information  Items Discharge Condition: Stable Ambulatory Status: Ambulatory Discharge Destination: Home Transportation: Private Auto Accompanied By: None Schedule Follow-up Appointment: Yes Clinical Summary of Care: Electronic Signature(s) Signed: 10/10/2021 1:23:25 PM By: [name] EMT Entered By: [name] on 10/10/2021 13:23:25 -------------------------------------------------------------------------------- Vitals Details Patient Name: Date of Service: [name] IN, [name] 10/10/2021 8:00 A M Medical Record Number: [id number] Patient Account Number: [id number] Date of Birth/Sex: Treating RN: [birth year] (51 y.o. [name] Blayze Haen: [name] Other Clinician: [name] Referring Dannon Nguyenthi: Treating Aneesh Faller/Extender: [name] in Treatment: 14 Vital Signs Time Taken: 08:15 Temperature (F): 98.1 Height (in): 72 Pulse (bpm): 96 Weight (lbs): 312 Respiratory Rate (breaths/min): 16 Body Mass Index (BMI): 42.3 Blood Pressure (mmHg): 111/77 Capillary Blood Glucose (mg/dl): 152 Reference Range: 80 - 120 mg / dl Electronic Signature(s) Signed: 10/10/2021 1:18:33 PM By: [name] EMT Entered By: [name] on 10/10/2021 13:18:33

## 2021-10-11 ENCOUNTER — Encounter (HOSPITAL_BASED_OUTPATIENT_CLINIC_OR_DEPARTMENT_OTHER): Payer: BLUE CROSS/BLUE SHIELD | Admitting: Internal Medicine

## 2021-10-11 DIAGNOSIS — E1149 Type 2 diabetes mellitus with other diabetic neurological complication: Secondary | ICD-10-CM

## 2021-10-11 DIAGNOSIS — L97514 Non-pressure chronic ulcer of other part of right foot with necrosis of bone: Secondary | ICD-10-CM | POA: Diagnosis not present

## 2021-10-11 DIAGNOSIS — M86671 Other chronic osteomyelitis, right ankle and foot: Secondary | ICD-10-CM | POA: Diagnosis not present

## 2021-10-11 DIAGNOSIS — E1169 Type 2 diabetes mellitus with other specified complication: Secondary | ICD-10-CM | POA: Diagnosis not present

## 2021-10-11 LAB — GLUCOSE, CAPILLARY
Glucose-Capillary: 159 mg/dL — ABNORMAL HIGH (ref 70–99)
Glucose-Capillary: 164 mg/dL — ABNORMAL HIGH (ref 70–99)

## 2021-10-11 NOTE — Progress Notes (Addendum)
[name], Mehki M. ([id number]) Visit Report for 10/11/2021 Arrival Information Details Patient Name: Date of Service: [place], [place] [name] 10/11/2021 8:00 A M Medical Record Number: [id number] Patient Account Number: [id number] Date of Birth/Sex: Treating RN: [birth year] (51 y.o. [name] Primary Care Shawnell Dykes: [name] Other Clinician: [name] Referring Kassadee Carawan: Treating Shaquasha Gerstel/Extender: [name] in Treatment: 14 Visit Information History Since Last Visit All ordered tests and consults were completed: Yes Patient Arrived: Ambulatory Added or deleted any medications: No Arrival Time: 07:39 Any new allergies or adverse reactions: No Accompanied By: None Had a fall or experienced change in No Transfer Assistance: None activities of daily living that may affect Patient Identification Verified: Yes risk of falls: Secondary Verification Process Completed: Yes Signs or symptoms of abuse/neglect since last visito No Patient Requires Transmission-Based Precautions: No Hospitalized since last visit: No Patient Has Alerts: Yes Implantable device outside of the clinic excluding No cellular tissue based products placed in the center since last visit: Pain Present Now: No Electronic Signature(s) Signed: 10/11/2021 11:31:06 AM By: [name] EMT Entered By: [name] on 10/11/2021 11:31:06 -------------------------------------------------------------------------------- Encounter Discharge Information Details Patient Name: Date of Service: [name], [name] 10/11/2021 8:00 A M Medical Record Number: [id number] Patient Account Number: [id number] Date of Birth/Sex: Treating RN: May 07, 1970 (51 y.o. [name] Primary Care Katora Fini: [name] Other Clinician: [name] Referring Satvik Parco: Treating Avrian Delfavero/Extender: [name] in Treatment: 14 Encounter Discharge Information  Items Discharge Condition: Stable Ambulatory Status: Ambulatory Discharge Destination: Home Transportation: Private Auto Accompanied By: None Schedule Follow-up Appointment: Yes Clinical Summary of Care: Electronic Signature(s) Signed: 10/11/2021 11:34:43 AM By: [name] EMT Entered By: [name] on 10/11/2021 11:34:43 -------------------------------------------------------------------------------- Vitals Details Patient Name: Date of Service: [name], [name] 10/11/2021 8:00 A M Medical Record Number: [id number] Patient Account Number: [id number] Date of Birth/Sex: Treating RN: 05/06/1970 (51 y.o. [name] Primary Care Ogechi Kuehnel: [name] Other Clinician: [name] Referring Keniah Klemmer: Treating Azure Barrales/Extender: [name] in Treatment: 14 Vital Signs Time Taken: 08:02 Temperature (F): 98.4 Height (in): 72 Pulse (bpm): 98 Weight (lbs): 312 Respiratory Rate (breaths/min): 16 Body Mass Index (BMI): 42.3 Blood Pressure (mmHg): 136/83 Capillary Blood Glucose (mg/dl): 159 Reference Range: 80 - 120 mg / dl Electronic Signature(s) Signed: 10/11/2021 11:31:44 AM By: [name] EMT Entered By: [name] on 10/11/2021 11:31:43

## 2021-10-11 NOTE — Progress Notes (Addendum)
[name], [name] ([id number]) Visit Report for 10/11/2021 HBO Details Patient Name: Date of Service: [name], [place] [name] 10/11/2021 8:00 A M Medical Record Number: [id number] Patient Account Number: [id number] Date of Birth/Sex: Treating RN: [birth year] [name] Primary Care Daelyn Mozer: [name] Other Clinician: [name] Referring Ada Woodbury: Treating Caren Garske/Extender: [name] in Treatment: 14 HBO Treatment Course Details Treatment Course Number: 1 Ordering Govanni Plemons: [name] T Treatments Ordered: otal 80 HBO Treatment Start Date: 08/14/2021 HBO Indication: Diabetic Ulcer(s) of the Lower Extremity Standard/Conservative Wound Care tried and failed greater than or equal to 30 days HBO Treatment Details Treatment Number: 42 Patient Type: Outpatient Chamber Type: Monoplace Chamber Serial #: [id number] Treatment Protocol: 2.0 ATA with 90 minutes oxygen, and no air breaks Treatment Details Compression Rate Down: 2.0 psi / minute De-Compression Rate Up: 2.0 psi / minute Air breaks and breathing Decompress Decompress Compress Tx Pressure Begins Reached periods Begins Ends (leave unused spaces blank) Chamber Pressure (ATA 1 2 ------2 1 ) Clock Time (24 hr) 08:14 08:22 - - - - - - 09:53 10:00 Treatment Length: 106 (minutes) Treatment Segments: 4 Vital Signs Capillary Blood Glucose Reference Range: 80 - 120 mg / dl HBO Diabetic Blood Glucose Intervention Range: <131 mg/dl or >249 mg/dl Time Vitals Blood Respiratory Capillary Blood Glucose Pulse Action Type: Pulse: Temperature: Taken: Pressure: Rate: Glucose (mg/dl): Meter #: Oximetry (%) Taken: Pre 08:02 136/83 98 16 98.4 159 Post 10:04 140/81 88 16 97.4 164 Treatment Response Treatment Toleration: Well Treatment Completion Status: Treatment Completed without Adverse Event Additional Procedure Documentation Tissue Sevierity: Necrosis of bone Physician HBO  Attestation: I certify that I supervised this HBO treatment in accordance with Medicare guidelines. A trained emergency response team is readily available per Yes hospital policies and procedures. Continue HBOT as ordered. Yes Electronic Signature(s) Signed: 10/11/2021 12:59:05 PM By: [name] DO Previous Signature: 10/11/2021 11:33:51 AM Version By: [name] EMT Entered By: [name] on 10/11/2021 12:58:15 -------------------------------------------------------------------------------- HBO Safety Checklist Details Patient Name: Date of Service: [name], [name] 10/11/2021 8:00 A M Medical Record Number: [id number] Patient Account Number: [id number] Date of Birth/Sex: Treating RN: [birth year] [name] Primary Care Buel Molder: [name] Other Clinician: [name] Referring Rochella Benner: Treating Malashia Kamaka/Extender: [name] in Treatment: 14 HBO Safety Checklist Items Safety Checklist Consent Form Signed Patient voided / foley secured and emptied When did you last eato 0630 Last dose of injectable or oral agent 0640 Ostomy pouch emptied and vented if applicable NA All implantable devices assessed, documented and approved NA Intravenous access site secured and place NA Valuables secured Linens and cotton and cotton/polyester blend (less than 51% polyester) Personal oil-based products / skin lotions / body lotions removed Wigs or hairpieces removed NA Smoking or tobacco materials removed Books / newspapers / magazines / loose paper removed Cologne, aftershave, perfume and deodorant removed Jewelry removed (may wrap wedding band) NA Make-up removed NA Hair care products removed Battery operated devices (external) removed Heating patches and chemical warmers removed Titanium eyewear removed NA Nail polish cured greater than 10 hours NA Casting material cured greater than 10 hours NA Hearing  aids removed NA Loose dentures or partials removed NA Prosthetics have been removed NA Patient demonstrates correct use of air break device (if applicable) Patient concerns have been addressed Patient grounding bracelet on and cord attached to chamber Specifics for Inpatients (complete in addition to above) Medication sheet sent with patient NA  Intravenous medications needed or due during therapy sent with patient NA Drainage tubes (e.g. nasogastric tube or chest tube secured and vented) NA Endotracheal or Tracheotomy tube secured NA Cuff deflated of air and inflated with saline NA Airway suctioned NA Notes The safety checklist was done before the treatment was started. Electronic Signature(s) Signed: 10/11/2021 11:32:39 AM By: [name] EMT Entered By: [name] on 10/11/2021 11:32:38

## 2021-10-11 NOTE — Progress Notes (Signed)
[name], Tec[place]se[place] M. ([id number]) Visit Report for 10/11/2021 Problem List Details Patient Name: Date of Service: [name], [place] [name] 10/11/2021 8:00 A M Medical Record Number: [id number] Patient Account Number: [id number] Date of Birth/Sex: Treating RN: 1970/07/13 (51 y.o. 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 10/11/2021 Medical Record Number: [id number] Patient Account Number: [id number] Date of Birth/Sex: Treating RN: 11/18/1970 (51 y.o. 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 LB01.751Non-pressure chronic ulcer of other part of right foot with necrosis  of bone M86.671 Other chronic osteomyelitis, right ankle and foot Modifier: Quantity: 4 Physician Procedures : CPT4 Code Description Modifier [id number] PHYS HYPERBARIC OXYGEN THERAPY ICD-10 Diagnosis Description 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

## 2021-10-14 ENCOUNTER — Encounter (HOSPITAL_BASED_OUTPATIENT_CLINIC_OR_DEPARTMENT_OTHER): Payer: BLUE CROSS/BLUE SHIELD | Admitting: Internal Medicine

## 2021-10-14 ENCOUNTER — Encounter: Payer: Self-pay | Admitting: *Deleted

## 2021-10-14 ENCOUNTER — Encounter (HOSPITAL_BASED_OUTPATIENT_CLINIC_OR_DEPARTMENT_OTHER): Payer: BLUE CROSS/BLUE SHIELD | Admitting: General Surgery

## 2021-10-14 DIAGNOSIS — M86671 Other chronic osteomyelitis, right ankle and foot: Secondary | ICD-10-CM

## 2021-10-14 DIAGNOSIS — L97514 Non-pressure chronic ulcer of other part of right foot with necrosis of bone: Secondary | ICD-10-CM | POA: Diagnosis not present

## 2021-10-14 DIAGNOSIS — E1169 Type 2 diabetes mellitus with other specified complication: Secondary | ICD-10-CM | POA: Diagnosis not present

## 2021-10-14 DIAGNOSIS — E1149 Type 2 diabetes mellitus with other diabetic neurological complication: Secondary | ICD-10-CM | POA: Diagnosis not present

## 2021-10-14 LAB — GLUCOSE, CAPILLARY
Glucose-Capillary: 129 mg/dL — ABNORMAL HIGH (ref 70–99)
Glucose-Capillary: 139 mg/dL — ABNORMAL HIGH (ref 70–99)
Glucose-Capillary: 119 mg/dL — ABNORMAL HIGH (ref 70–99)

## 2021-10-14 NOTE — Progress Notes (Addendum)
[name], [name] ([id number]) Visit Report for 10/14/2021 HBO Details Patient Name: Date of Service: [name],  [name] 10/14/2021 8:00 A M Medical Record Number: [id number] Patient Account Number: [id number] Date of Birth/Sex: Treating RN: [birth year] [name], [name] Primary Care [place]: [name] Other Clinician: [name] Referring Vennela Jutte: Treating Mitchell Epling/Extender: [name] in Treatment: 14 HBO Treatment Course Details Treatment Course Number: 1 Ordering Wylie Coon: [name] T Treatments Ordered: otal 80 HBO Treatment Start Date: 08/14/2021 HBO Indication: Diabetic Ulcer(s) of the Lower Extremity Standard/Conservative Wound Care tried and failed greater than or equal to 30 days HBO Treatment Details Treatment Number: 43 Patient Type: Outpatient Chamber Type: Monoplace Chamber Serial #: [id number] Treatment Protocol: 2.0 ATA with 90 minutes oxygen, and no air breaks Treatment Details Compression Rate Down: 2.0 psi / minute De-Compression Rate Up: 2.0 psi / minute Air breaks and breathing Decompress Decompress Compress Tx Pressure Begins Reached periods Begins Ends (leave unused spaces blank) Chamber Pressure (ATA 1 2 ------2 1 ) Clock Time (24 hr) 08:31 08:39 - - - - - - 10:09 10:17 Treatment Length: 106 (minutes) Treatment Segments: 4 Vital Signs Capillary Blood Glucose Reference Range: 80 - 120 mg / dl HBO Diabetic Blood Glucose Intervention Range: <131 mg/dl or >249 mg/dl Type: Time Vitals Blood Pulse: Respiratory Capillary Blood Glucose Pulse Action Temperature: Taken: Pressure: Rate: Glucose (mg/dl): Meter #: Oximetry (%) Taken: Pre 08:05 125/71 100 16 98.1 129 Patient given 8 oz glucerna Pre 08:28 66 Spoke with [name] Post 10:23 130/89 81 18 97.2 119 Treatment Response Treatment Toleration: Well Treatment Completion Status: Treatment Completed without Adverse Event Treatment Notes At  0805 the patient blood sugar was 129. The patient was given 8 oz Glucerna. Rechecked blood sugar at 0828 was 139. Spoke with [name] ref the patient blood sugar. Dr. Elinor Dodge stated to start treatment. The patient has a [facility] visit, after his treatment today. Additional Procedure Documentation Tissue Sevierity: Necrosis of bone Physician HBO Attestation: I certify that I supervised this HBO treatment in accordance with Medicare guidelines. A trained emergency response team is readily available per Yes hospital policies and procedures. Continue HBOT as ordered. Yes Electronic Signature(s) Signed: 10/14/2021 4:01:39 PM By: [name] DO Previous Signature: 10/14/2021 1:31:57 PM Version By: [name] EMT Previous Signature: 10/14/2021 1:30:11 PM Version By: [name] EMT Entered By: [name] on 10/14/2021 16:00:11 -------------------------------------------------------------------------------- HBO Safety Checklist Details Patient Name: Date of Service: [name], [name] 10/14/2021 8:00 A M Medical Record Number: [id number] Patient Account Number: [id number] Date of Birth/Sex: Treating RN: Apr 06, 1970 [name], [name] Primary Care Daleisa Halperin: [name] Other Clinician: [name] Referring Dezhane Staten: Treating Jamiel Goncalves/Extender: [name] in Treatment: 14 HBO Safety Checklist Items Safety Checklist Consent Form Signed Patient voided / foley secured and emptied When did you last eato 0650 Last dose of injectable or oral agent 0640 Ostomy pouch emptied and vented if applicable NA All implantable devices assessed, documented and approved NA Intravenous access site secured and place NA Valuables secured Linens and cotton and cotton/polyester blend (less than 51% polyester) Personal oil-based products / skin lotions / body lotions removed Wigs or hairpieces removed NA Smoking or tobacco materials  removed Books / newspapers / magazines / loose paper removed Cologne, aftershave, perfume and deodorant removed Jewelry removed (may wrap wedding band) NA Make-up removed NA Hair care products removed Battery operated devices (external) removed Heating patches and chemical warmers removed  Titanium eyewear removed NA Nail polish cured greater than 10 hours NA Casting material cured greater than 10 hours NA Hearing aids removed NA Loose dentures or partials removed NA Prosthetics have been removed NA Patient demonstrates correct use of air break device (if applicable) Patient concerns have been addressed Patient grounding bracelet on and cord attached to chamber Specifics for Inpatients (complete in addition to above) Medication sheet sent with patient NA Intravenous medications needed or due during therapy sent with patient NA Drainage tubes (e.g. nasogastric tube or chest tube secured and vented) NA Endotracheal or Tracheotomy tube secured NA Cuff deflated of air and inflated with saline NA Airway suctioned NA Notes The safety checklist was done before the treatment was started. Electronic Signature(s) Signed: 10/14/2021 1:21:31 PM By: [name] EMT Entered By: [name] on 10/14/2021 13:21:31

## 2021-10-14 NOTE — Progress Notes (Signed)
[name], Zalen M. ([id number]) Visit Report for 10/14/2021 Arrival Information Details Patient Name: Date of Service: [place], [place] [name] 10/14/2021 10:00 A M Medical Record Number: [id number] Patient Account Number: [id number] Date of Birth/Sex: Treating RN: 11-07-1970 [name] Primary Care Milanie Rosenfield: [name] Other Clinician: Referring Paislie Tessler: Treating Hamdi Vari/Extender: [name] in Treatment: 14 Visit Information History Since Last Visit Added or deleted any medications: No Patient Arrived: Ambulatory Any new allergies or adverse reactions: No Arrival Time: 10:29 Had a fall or experienced change in No Accompanied By: self activities of daily living that may affect Transfer Assistance: None risk of falls: Patient Identification Verified: Yes Signs or symptoms of abuse/neglect since last visito No Secondary Verification Process Completed: Yes Hospitalized since last visit: No Patient Requires Transmission-Based Precautions: No Implantable device outside of the clinic excluding No Patient Has Alerts: Yes cellular tissue based products placed in the center since last visit: Has Dressing in Place as Prescribed: Yes Pain Present Now: No Electronic Signature(s) Signed: 10/14/2021 4:46:26 PM By: Adline Peals Entered By: Adline Peals on 10/14/2021 10:29:59 -------------------------------------------------------------------------------- Encounter Discharge Information Details Patient Name: Date of Service: [place], [name] 10/14/2021 10:00 A M Medical Record Number: [id number] Patient Account Number: [id number] Date of Birth/Sex: Treating RN: 11/22/1970 [name] Primary Care Rich Paprocki: [name] Other Clinician: Referring Yanna Leaks: Treating Wai Minotti/Extender: [name] in Treatment: 14 Encounter Discharge Information Items Post Procedure Vitals Discharge  Condition: Stable Temperature (F): 97.2 Ambulatory Status: Ambulatory Pulse (bpm): 81 Discharge Destination: Home Respiratory Rate (breaths/min): 16 Transportation: Private Auto Blood Pressure (mmHg): 130/89 Accompanied By: self Schedule Follow-up Appointment: Yes Clinical Summary of Care: Patient Declined Electronic Signature(s) Signed: 10/14/2021 4:46:26 PM By: Adline Peals Entered By: Adline Peals on 10/14/2021 10:54:12 -------------------------------------------------------------------------------- Lower Extremity Assessment Details Patient Name: Date of Service: [name], [name] 10/14/2021 10:00 A M Medical Record Number: [id number] Patient Account Number: [id number] Date of Birth/Sex: Treating RN: 02/10/1970 [name] Primary Care Raunak Antuna: [name] Other Clinician: Referring Kimya Mccahill: Treating Lauri Till/Extender: [name] in Treatment: 14 Edema Assessment Assessed: [name]: No] [name]: No] Edema: [Left: N] [Right: o] Calf Left: Right: Point of Measurement: From Medial Instep 39.8 cm Ankle Left: Right: Point of Measurement: From Medial Instep 23.5 cm Vascular Assessment Pulses: Dorsalis Pedis Palpable: [Right:Yes] Electronic Signature(s) Signed: 10/14/2021 4:46:26 PM By: Adline Peals Entered By: Adline Peals on 10/14/2021 10:32:51 -------------------------------------------------------------------------------- Multi Wound Chart Details Patient Name: Date of Service: [name], [name] 10/14/2021 10:00 A M Medical Record Number: [id number] Patient Account Number: [id number] Date of Birth/Sex: Treating RN: [birth year] [name] Primary Care Quyen Cutsforth: [name] Other Clinician: Referring Rulon Abdalla: Treating Suheyb Raucci/Extender: [name] in Treatment: 14 Vital Signs Height(in): 72 Capillary Blood Glucose(mg/dl): 119 Weight(lbs):  312 Pulse(bpm): 58 Body Mass Index(BMI): 42.3 Blood Pressure(mmHg): 130/89 Temperature(F): 97.2 Respiratory Rate(breaths/min): 16 Photos: [2:Right, Lateral, Plantar Foot] [N/A:N/A N/A] Wound Location: [2:Gradually Appeared] [N/A:N/A] Wounding Event: [2:Diabetic Wound/Ulcer of the Lower] [N/A:N/A] Primary Etiology: [2:Extremity Type II Diabetes] [N/A:N/A] Comorbid History: [2:12/19/2016] [N/A:N/A] Date Acquired: [2:14] [N/A:N/A] Weeks of Treatment: [2:Open] [N/A:N/A] Wound Status: [2:No] [N/A:N/A] Wound Recurrence: [2:0.1x0.6x1.2] [N/A:N/A] Measurements L x W x D (cm) [2:0.047] [N/A:N/A] A (cm) : rea [2:0.057] [N/A:N/A] Volume (cm) : [2:98.70%] [N/A:N/A] % Reduction in A [2:rea: 99.70%] [N/A:N/A] % Reduction in Volume: [2:Grade 3] [N/A:N/A] Classification: [2:Medium] [N/A:N/A] Exudate A mount: [2:Serosanguineous] [N/A:N/A] Exudate Type: [2:red, brown] [  N/A:N/A] Exudate Color: [2:Thickened] [N/A:N/A] Wound Margin: [2:Large (67-100%)] [N/A:N/A] Granulation A mount: [2:Pink] [N/A:N/A] Granulation Quality: [2:Small (1-33%)] [N/A:N/A] Necrotic A mount: [2:Fat Layer (Subcutaneous Tissue): Yes N/A] Exposed Structures: [2:Fascia: No Tendon: No Muscle: No Joint: No Bone: No Small (1-33%)] [N/A:N/A] Epithelialization: [2:Debridement - Selective/Open Wound N/A] Debridement: Pre-procedure Verification/Time Out 10:37 [N/A:N/A] Taken: [2:Lidocaine 4% Topical Solution] [N/A:N/A] Pain Control: [2:Callus] [N/A:N/A] Tissue Debrided: [2:Skin/Epidermis] [N/A:N/A] Level: [2:0.06] [N/A:N/A] Debridement A (sq cm): [2:rea Curette] [N/A:N/A] Instrument: [2:Minimum] [N/A:N/A] Bleeding: [2:Pressure] [N/A:N/A] Hemostasis A chieved: [2:0] [N/A:N/A] Procedural Pain: [2:0] [N/A:N/A] Post Procedural Pain: [2:Procedure was tolerated well] [N/A:N/A] Debridement Treatment Response: [2:0.1x0.6x1.2] [N/A:N/A] Post Debridement Measurements L x W x D (cm) [2:0.057] [N/A:N/A] Post Debridement Volume:  (cm) [2:Debridement] [N/A:N/A] Treatment Notes Electronic Signature(s) Signed: 10/14/2021 10:50:55 AM By: [name] MD FACS Signed: 10/14/2021 4:46:26 PM By: Adline Peals Entered By: [name] on 10/14/2021 10:50:54 -------------------------------------------------------------------------------- Multi-Disciplinary Care Plan Details Patient Name: Date of Service[name] 10/14/2021 10:00 A M Medical Record Number: [id number] Patient Account Number: [id number] Date of Birth/Sex: Treating RN: 29-Mar-1970 [name] Primary Care Claudell Rhody: [name] Other Clinician: Referring Bunnie Lederman: Treating Sukari Grist/Extender: [name] in Treatment: [place] reviewed with physician Active Inactive Nutrition Nursing Diagnoses: Impaired glucose control: actual or potential Potential for alteratiion in Nutrition/Potential for imbalanced nutrition Goals: Patient/caregiver will maintain therapeutic glucose control Date Initiated: 07/29/2021 Target Resolution Date: 11/19/2021 Goal Status: Active Interventions: Assess patient nutrition upon admission and as needed per policy Provide education on elevated blood sugars and impact on wound healing Treatment Activities: Patient referred to Primary Care Physician for further nutritional evaluation : 07/29/2021 Notes: Osteomyelitis Nursing Diagnoses: Infection: osteomyelitis Knowledge deficit related to disease process and management Goals: Patient/caregiver will verbalize understanding of disease process and disease management Date Initiated: 07/29/2021 Target Resolution Date: 11/19/2021 Goal Status: Active Patient's osteomyelitis will resolve Date Initiated: 07/29/2021 Target Resolution Date: 11/19/2021 Goal Status: Active Interventions: Assess for signs and symptoms of osteomyelitis resolution every visit Provide education on  osteomyelitis Treatment Activities: Consult for HBO : 07/29/2021 Notes: Wound/Skin Impairment Nursing Diagnoses: Impaired tissue integrity Goals: Patient/caregiver will verbalize understanding of skin care regimen Date Initiated: 07/04/2021 Target Resolution Date: 11/19/2021 Goal Status: Active Interventions: Assess ulceration(s) every visit Treatment Activities: Skin care regimen initiated : 07/04/2021 Notes: Electronic Signature(s) Signed: 10/14/2021 4:46:26 PM By: Adline Peals Entered By: Adline Peals on 10/14/2021 10:37:17 -------------------------------------------------------------------------------- Pain Assessment Details Patient Name: [place] of [name] 10/14/2021 10:00 A M Medical Record Number: [id number] Patient Account Number: [id number] Date of Birth/Sex: Treating RN: 1970-10-20 (50 y.o. [name] Primary Care Dezi Schaner: [name] Other Clinician: Referring Kiandre Spagnolo: Treating Tyiesha Brackney/Extender: [name] in Treatment: 14 Active Problems Location of Pain Severity and Description of Pain Patient Has Paino No Site Locations Rate the pain. Current Pain Level: 0 Pain Management and Medication Current Pain Management: Electronic Signature(s) Signed: 10/14/2021 4:46:26 PM By: Adline Peals Entered By: Adline Peals on 10/14/2021 10:30:29 -------------------------------------------------------------------------------- Patient/Caregiver Education Details Patient Name: Date of Service: [place], [name] 9/25/2023andnbsp10:00 A M Medical Record Number: [id number] Patient Account Number: [id number] Date of Birth/Gender: Treating RN: October 28, 1970 [name] Primary Care Physician: [name] Other Clinician: Referring Physician: Treating Physician/Extender: [name] in Treatment: 14 Education Assessment Education Provided  To: Patient Education Topics Provided Wound/Skin Impairment: Methods: Explain/Verbal Responses: Reinforcements needed, State content correctly [employer]) Signed: 10/14/2021 4:46:26 PM  By: Sabas Sous By: Adline Peals on 10/14/2021 10:37:29 -------------------------------------------------------------------------------- Wound Assessment Details Patient Name: Date of Service[name] 10/14/2021 10:00 A M Medical Record Number: [id number] Patient Account Number: [id number] Date of Birth/Sex: Treating RN: [birth year] [name] Primary Care Shriya Aker: [name] Other Clinician: Referring Nawaf Strange: Treating Arnav Cregg/Extender: [name] in Treatment: 14 Wound Status Wound Number: 2 Primary Etiology: Diabetic Wound/Ulcer of the Lower Extremity Wound Location: Right, Lateral, Plantar Foot Wound Status: Open Wounding Event: Gradually Appeared Comorbid History: Type II Diabetes Date Acquired: 12/19/2016 Weeks Of Treatment: 14 Clustered Wound: No Photos Wound Measurements Length: (cm) 0.1 Width: (cm) 0.6 Depth: (cm) 1.2 Area: (cm) 0.047 Volume: (cm) 0.057 % Reduction in Area: 98.7% % Reduction in Volume: 99.7% Epithelialization: Small (1-33%) Tunneling: No Undermining: No Wound Description Classification: Grade 3 Wound Margin: Thickened Exudate Amount: Medium Exudate Type: Serosanguineous Exudate Color: red, brown Foul Odor After Cleansing: No Slough/Fibrino Yes Wound Bed Granulation Amount: Large (67-100%) Exposed Structure Granulation Quality: Pink Fascia Exposed: No Necrotic Amount: Small (1-33%) Fat Layer (Subcutaneous Tissue) Exposed: Yes Necrotic Quality: Adherent Slough Tendon Exposed: No Muscle Exposed: No Joint Exposed: No Bone Exposed: No Treatment Notes Wound #2 (Foot) Wound Laterality: Plantar, Right, Lateral Cleanser Soap and Water Discharge  Instruction: May shower and wash wound with dial antibacterial soap and water prior to dressing change. Wound Cleanser Discharge Instruction: Cleanse the wound with wound cleanser prior to applying a clean dressing using gauze sponges, not tissue or cotton balls. Peri-Wound Care cotton tipped applicators Topical Gentamicin Discharge Instruction: As directed by physician Primary Dressing Iodoform packing strip 1/4 (in) Discharge Instruction: Lightly pack as instructed Secondary Dressing Woven Gauze Sponge, Non-Sterile 4x4 in Discharge Instruction: Apply over primary dressing as directed. Secured With Paper Tape, 2x10 (in/yd) Discharge Instruction: Secure dressing with tape as directed. Compression Wrap Kerlix Roll 4.5x3.1 (in/yd) Discharge Instruction: Apply Kerlix and Coban compression as directed. Compression Stockings Add-Ons Electronic Signature(s) Signed: 10/14/2021 4:46:26 PM By: Adline Peals Entered By: Adline Peals on 10/14/2021 10:35:07 -------------------------------------------------------------------------------- Vitals Details Patient Name: Date of Service[name] 10/14/2021 10:00 A M Medical Record Number: [id number] Patient Account Number: [id number] Date of Birth/Sex: Treating RN: 06-06-1970 [name] Primary Care Devyon Keator: [name] Other Clinician: Referring Datha Kissinger: Treating Keelin Sheridan/Extender: [name] in Treatment: 14 Vital Signs Time Taken: 10:23 Temperature (F): 97.2 Height (in): 72 Pulse (bpm): 81 Weight (lbs): 312 Respiratory Rate (breaths/min): 16 Body Mass Index (BMI): 42.3 Blood Pressure (mmHg): 130/89 Capillary Blood Glucose (mg/dl): 119 Reference Range: 80 - 120 mg / dl Electronic Signature(s) Signed: 10/14/2021 4:46:26 PM By: Adline Peals Entered By: Adline Peals on 10/14/2021 10:30:24

## 2021-10-14 NOTE — Progress Notes (Signed)
[name], [name] ([id number]) Visit Report for 10/14/2021 Problem List Details Patient Name: Date of Service: [name] 10/14/2021 8:00 A M Medical Record Number: [id number] Patient Account Number: [id number] Date of Birth/Sex: Treating RN: 1970/06/04 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 10/14/2021 Medical Record Number: [id number] Patient Account Number: [id number] Date of Birth/Sex: Treating RN: 1970-05-23 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 LT66.060Non-pressure chronic ulcer of other part of right foot with necrosis  of bone M86.671 Other chronic osteomyelitis, right ankle and foot Modifier: Quantity: 4 Physician Procedures : CPT4 Code Description Modifier [id number] PHYS HYPERBARIC OXYGEN THERAPY ICD-10 Diagnosis Description 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

## 2021-10-14 NOTE — Progress Notes (Addendum)
[name], [name] ([id number]) Visit Report for 10/14/2021 Arrival Information Details Patient Name: Date of Service: [place],  [name] 10/14/2021 8:00 A M Medical Record Number: [id number] Patient Account Number: [id number] Date of Birth/Sex: Treating RN: Dec 30, 1970 [name], [name] Primary Care Marckus Hanover: [name] Other Clinician: [name] Referring Hanif Radin: Treating Cailen Mihalik/Extender: [name] in Treatment: 14 Visit Information History Since Last Visit All ordered tests and consults were completed: Yes Patient Arrived: Ambulatory Added or deleted any medications: No Arrival Time: 07:43 Any new allergies or adverse reactions: No Accompanied By: None Had a fall or experienced change in No Transfer Assistance: None activities of daily living that may affect Patient Identification Verified: Yes risk of falls: Secondary Verification Process Completed: Yes Signs or symptoms of abuse/neglect since last visito No Patient Requires Transmission-Based Precautions: No Hospitalized since last visit: No Patient Has Alerts: Yes Implantable device outside of the clinic excluding No cellular tissue based products placed in the center since last visit: Pain Present Now: No Electronic Signature(s) Signed: 10/14/2021 1:19:45 PM By: [name] EMT Entered By: [name] on 10/14/2021 13:19:45 -------------------------------------------------------------------------------- Encounter Discharge Information Details Patient Name: Date of Service: [place], [name] 10/14/2021 8:00 A M Medical Record Number: [id number] Patient Account Number: [id number] Date of Birth/Sex: Treating RN: 1970/07/25 [name], [name] Primary Care Altagracia Rone: [name] Other Clinician: [name] Referring Emeline Simpson: Treating Destan Franchini/Extender: [name] in Treatment: 14 Encounter Discharge Information  Items Discharge Condition: Stable Ambulatory Status: Ambulatory Discharge Destination: Other (Note Required) Transportation: Private Auto Accompanied By: None Schedule Follow-up Appointment: Yes Clinical Summary of Care: Notes The patient has a [facility] visit after treatment today. Electronic Signature(s) Signed: 10/14/2021 1:33:06 PM By: [name] EMT Entered By: [name] on 10/14/2021 13:33:06 -------------------------------------------------------------------------------- Vitals Details Patient Name: Date of Service[name] 10/14/2021 8:00 A M Medical Record Number: [id number] Patient Account Number: [id number] Date of Birth/Sex: Treating RN: 1970/03/03 [name], [name] Primary Care Cleston Lautner: [name] Other Clinician: [name] Referring Antwoine Zorn: Treating Tanish Sinkler/Extender: [name] in Treatment: 14 Vital Signs Time Taken: 08:05 Temperature (F): 98.1 Height (in): 72 Pulse (bpm): 100 Weight (lbs): 312 Respiratory Rate (breaths/min): 16 Body Mass Index (BMI): 42.3 Blood Pressure (mmHg): 125/71 Capillary Blood Glucose (mg/dl): 129 Reference Range: 80 - 120 mg / dl Electronic Signature(s) Signed: 10/14/2021 1:20:17 PM By: [name] EMT Entered By: [name] on 10/14/2021 13:20:17

## 2021-10-14 NOTE — Progress Notes (Signed)
[name], [name] ([id number]) Visit Report for 10/14/2021 Chief Complaint Document Details Patient Name: Date of Service: [place],  [name] 10/14/2021 10:00 A M Medical Record Number: [id number] Patient Account Number: [id number] Date of Birth/Sex: Treating RN: 1970-06-29 (51 y.o. [name] Primary Care Provider: [name] Other Clinician: Referring Provider: Treating Provider/Extender: [name] in Treatment: 14 Information Obtained from: Patient Chief Complaint Patients presents for treatment of an open diabetic ulcer Electronic Signature(s) Signed: 10/14/2021 10:51:03 AM By: [name] MD FACS Entered By: [name] on 10/14/2021 10:51:02 -------------------------------------------------------------------------------- Debridement Details Patient Name: Date of Service: [name], [name] 10/14/2021 10:00 A M Medical Record Number: [id number] Patient Account Number: [id number] Date of Birth/Sex: Treating RN: [birth year] (51 y.o. [name] Primary Care Provider: [name] Other Clinician: Referring Provider: Treating Provider/Extender: [name] in Treatment: 14 Debridement Performed for Assessment: Wound #2 Right,Lateral,Plantar Foot Performed By: Physician [name], MD Debridement Type: Debridement Severity of Tissue Pre Debridement: Fat layer exposed Level of Consciousness (Pre-procedure): Awake and Alert Pre-procedure Verification/Time Out Yes - 10:37 Taken: Start Time: 10:37 Pain Control: Lidocaine 4% T opical Solution T Area Debrided (L x W): otal 0.1 (cm) x 0.6 (cm) = 0.06 (cm) Tissue and other material debrided: Non-Viable, Callus, Skin: Epidermis Level: Skin/Epidermis Debridement Description: Selective/Open Wound Instrument: Curette Bleeding: Minimum Hemostasis Achieved: Pressure Procedural Pain: 0 Post Procedural Pain: 0 Response to Treatment:  Procedure was tolerated well Level of Consciousness (Post- Awake and Alert procedure): Post Debridement Measurements of Total Wound Length: (cm) 0.1 Width: (cm) 0.6 Depth: (cm) 1.2 Volume: (cm) 0.057 Character of Wound/Ulcer Post Debridement: Improved Severity of Tissue Post Debridement: Fat layer exposed Post Procedure Diagnosis Same as Pre-procedure Notes scribed for [name] by Adline Peals, RN Electronic Signature(s) Signed: 10/14/2021 11:51:09 AM By: [name] MD FACS Signed: 10/14/2021 4:46:26 PM By: Adline Peals Entered By: Adline Peals on 10/14/2021 10:38:43 -------------------------------------------------------------------------------- HPI Details Patient Name: Date of Service: [name], [name] 10/14/2021 10:00 A M Medical Record Number: [id number] Patient Account Number: [id number] Date of Birth/Sex: Treating RN: 16-Jul-1970 (51 y.o. 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 The most recent amputation was in August 2022. At that operation, antibiotic beads were placed in the wound. He has been managed by podiatry for his procedures and management of his wounds. He has been in a Water engineer. He is on oral doxycycline. They have been using Betadine wet to dry dressings along with Iodosorb. The patient states that when he thinks the wound is getting too dry, he applies topical Neosporin. At his last visit, on June 7 of this year, the podiatrist determined that he felt the wound was stalled and referred him to wound care for additional evaluation and management. An  MRI has been ordered, but not yet scheduled or performed. Pathology from his operation in August 2022 demonstrated findings consistent with chronic osteomyelitis. Today, there is a large irregular wound on the plantar surface of his right foot, at about the level of the fifth metatarsal head. This tracks through to a pinpoint opening on the dorsal lateral portion of his foot. The intake nurse reported  purulent drainage. There is some malodor from the wound. No frank necrosis identified. 07/11/2021: Today, the wounds do not connect. I attempted multiple times from various directions and the shared tunnel is no longer open. He has some slough accumulation on the dorsal part of his foot as well as slough and callus buildup on the plantar surface. His MRI is scheduled for June 29. No purulent drainage or malodor appreciated today. 07/19/2021: The lateral foot wound has closed and there is no tunnel connecting the plantar foot wound to that site. The plantar foot wound still probes quite deeply, however. There is some slough, eschar, and nonviable tissue accumulated in the wound bed. No malodorous drainage present. His MRI was performed yesterday and is consistent with fairly extensive osteomyelitis. 07/29/2021: The patient has an appointment with infectious disease on July 18 to treat his osteomyelitis. The plantar wound still probes quite deeply, approaching bone. The orifice has narrowed quite substantially, however, making it more difficult for him to pack. 08/05/2021: The tunnel connecting the lateral foot wound to the plantar foot wound has reopened. He sees infectious disease tomorrow to discuss long-term antibiotic treatment for osteomyelitis. There was a bit of murky drainage in the wound, but this was noted after he had had topical lidocaine applied so may have just been a blob of the anesthetic. No odor or frank purulent drainage. The wound probes deeply at the midfoot approaching bone. He does  have MRI results confirming his diagnosis of osteomyelitis. He has failed to progress with conventional treatment and I think his best chance for preservation of the foot is to initiate hyperbaric oxygen therapy. 08/13/2021: The tunnel connecting the 2 wounds has closed again. He has a PICC line and is getting IV daptomycin and cefepime. EKG and chest x-ray are within normal limits. He is scheduled to start hyperbaric oxygen therapy tomorrow. The wound in his midfoot probes deeply, approaching bone. The skin at the orifice continues to heaped up and threatens to close over despite the large cavity within. No erythema, induration, or purulent drainage. The wound on his lateral foot is fairly small and quite clean. 08/19/2021: The lateral foot wound has nearly closed. The wound in his midfoot does not probe quite as deeply today. The skin at the orifice continues to try to roll in and obscure the opening. No frankly necrotic tissue appreciated. He is tolerating hyperbaric oxygen therapy. 08/26/2021: The lateral foot wound has closed completely. The wound in his midfoot is shallower again today. The wound orifice is contracting. We are using gentamicin and silver alginate. He continues on IV daptomycin and cefepime and is tolerating hyperbaric oxygen therapy without difficulty. 09/02/2021: His foot is a little bit sore today but he was up walking on it all weekend doing back to school shopping with his daughter. The tunneling is down to about 3 cm and I do not appreciate bone at the tip of the probe. The wound is clean but has some callus creating an overhanging lip at the distal aspect. He continues to receive hyperbaric oxygen therapy as well as IV daptomycin and cefepime. 09/09/2021: His wound continues to improve. The tunnel has come in by 0.9 cm. He continues to form callus around the orifice of the wound. He is tolerating hyperbaric oxygen therapy along with his IV antibiotics. 09/16/2021: The tunneling  is down to just half a centimeter. He continues to build up callus around the orifice of the wound. He completed his course of IV antibiotics and his PICC line  is scheduled to be removed this Friday. He is tolerating hyperbaric oxygen without difficulty. We have been applying topical gentamicin and silver alginate to his wound. 09/24/2021: The wound depth has come in even further. There is a little bit of callus buildup around the orifice. The opening is quite narrow, at this point. 09/30/2021: The wound depth is about the same this week. The opening continues to contract with callus accumulation. There is some discoloration of the skin on the lateral part of his foot and he admitted to walking more than usual this weekend and also wearing a different pair shoes. He continues to tolerate hyperbaric oxygen therapy without difficulty. 10/07/2021: The wound depth has contracted a bit and is now approximately 1 to 1.5 cm. The orifice of the wound continues to try and close in over the space. There is just some callus and skin around the margins obscuring the orifice. 10/14/2021: The wound depth has come in a little bit more. The orifice of the wound is rolling inwards with epithelium and callus, making it difficult to pack the wound. Electronic Signature(s) Signed: 10/14/2021 10:51:34 AM By: [name] MD FACS Entered By: [name] on 10/14/2021 10:51:34 -------------------------------------------------------------------------------- Physical Exam Details Patient Name: Date of Service: [name], [name] 10/14/2021 10:00 A M Medical Record Number: [id number] Patient Account Number: [id number] Date of Birth/Sex: Treating RN: 09-07-1970 (51 y.o. [name] Primary Care Provider: [name] Other Clinician: Referring Provider: Treating Provider/Extender: [name] in Treatment: 14 Constitutional ..... Respiratory Normal work of breathing on room  air.. Notes 10/14/2021: The wound depth has come in a little bit more. The orifice of the wound is rolling inwards with epithelium and callus, making it difficult to pack the wound. Electronic Signature(s) Signed: 10/14/2021 10:51:59 AM By: [name] MD FACS Entered By: [name] on 10/14/2021 10:51:59 -------------------------------------------------------------------------------- Physician Orders Details Patient Name: Date of Service: [name], [name] 10/14/2021 10:00 A M Medical Record Number: [id number] Patient Account Number: [id number] Date of Birth/Sex: Treating RN: 10-Apr-1970 (51 y.o. 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 - Dr Celine Ahr Room 2 Return A Anesthetic Wound #2 [place] (In clinic) Topical Lidocaine 4% applied to wound bed Bathing/ Shower/ Hygiene Other Bathing/Shower/Hygiene Orders/Instructions: - Change dressing after bathing Off-Loading Other: - Try to not stand on your feet too much Hyperbaric Oxygen Therapy Evaluate for HBO Therapy Indication: - wagner grade 3 diabetic foot ulcer right foot 2.0 ATA for 90 Minutes without A Breaks ir Total Number of Treatments: - 40 more treatments for a total of 80 09/30/21 One treatments per day (delivered Monday through Friday unless otherwise specified in Special Instructions below): Finger stick Blood Glucose Pre- and Post- HBOT Treatment. Follow Hyperbaric Oxygen Glycemia Protocol Afrin (Oxymetazoline HCL)  0.05% nasal spray - 1 spray in both nostrils daily as needed prior to HBO treatment for difficulty clearing ears Wound Treatment Wound #2 - Foot Wound Laterality: Plantar, Right, Lateral Cleanser: Soap and Water Every Other Day/15 Days Discharge Instructions: May shower and wash wound with dial antibacterial soap and water prior to dressing change. Cleanser: Wound Cleanser (  Generic) Every Other Day/15 Days Discharge Instructions: Cleanse the wound with wound cleanser prior to applying a clean dressing using gauze sponges, not tissue or cotton balls. Peri-Wound Care: cotton tipped applicators (Generic) Every Other Day/15 Days Topical: Gentamicin Every Other Day/15 Days Discharge Instructions: As directed by physician Prim Dressing: Iodoform packing strip 1/4 (in) Every Other Day/15 Days ary Discharge Instructions: Lightly pack as instructed Secondary Dressing: Woven Gauze Sponge, Non-Sterile 4x4 in Every Other Day/15 Days Discharge Instructions: Apply over primary dressing as directed. Secured With: Paper Tape, 2x10 (in/yd) (Generic) Every Other Day/15 Days Discharge Instructions: Secure dressing with tape as directed. Compression Wrap: Kerlix Roll 4.5x3.1 (in/yd) (Generic) Every Other Day/15 Days Discharge Instructions: Apply Kerlix and Coban compression as directed. GLYCEMIA INTERVENTIONS PROTOCOL PRE-HBO GLYCEMIA INTERVENTIONS ACTION INTERVENTION Obtain pre-HBO capillary blood glucose (ensure 1 physician order is in chart). A. Notify HBO physician and await physician orders. 2 If result is 70 mg/dl or below: B. If the result meets the hospital definition of a critical result, follow hospital policy. A. Give patient an 8 ounce Glucerna Shake, an 8 ounce Ensure, or 8 ounces of a Glucerna/Ensure equivalent dietary supplement*. B. Wait 30 minutes. If result is 71 mg/dl to 130 mg/dl: C. Retest patients capillary blood glucose (CBG). D. If result greater than or equal to 110  mg/dl, proceed with HBO. If result less than 110 mg/dl, notify HBO physician and consider holding HBO. If result is 131 mg/dl to 249 mg/dl: A. Proceed with HBO. A. Notify HBO physician and await physician orders. B. It is recommended to hold HBO and do If result is 250 mg/dl or greater: blood/urine ketone testing. C. If the result meets the hospital definition of a critical result, follow hospital policy. POST-HBO GLYCEMIA INTERVENTIONS ACTION INTERVENTION Obtain post HBO capillary blood glucose (ensure 1 physician order is in chart). A. Notify HBO physician and await physician orders. 2 If result is 70 mg/dl or below: B. If the result meets the hospital definition of a critical result, follow hospital policy. A. Give patient an 8 ounce Glucerna Shake, an 8 ounce Ensure, or 8 ounces of a Glucerna/Ensure equivalent dietary supplement*. B. Wait 15 minutes for symptoms of If result is 71 mg/dl to 100 mg/dl: hypoglycemia (i.e. nervousness, anxiety, sweating, chills, clamminess, irritability, confusion, tachycardia or dizziness). C. If patient asymptomatic, discharge patient. If patient symptomatic, repeat capillary blood glucose (CBG) and notify HBO physician. If result is 101 mg/dl to 249 mg/dl: A. Discharge patient. A. Notify HBO physician and await physician orders. B. It is recommended to do blood/urine ketone If result is 250 mg/dl or greater: testing. C. If the result meets the hospital definition of a critical result, follow hospital policy. *Juice or candies are NOT equivalent products. If patient refuses the Glucerna or Ensure, please consult the hospital dietitian for an appropriate substitute. Electronic Signature(s) Signed: 10/14/2021 11:51:09 AM By: [name] MD FACS Entered By: [name] on 10/14/2021 10:52:12 -------------------------------------------------------------------------------- Problem List Details Patient Name: Date of  Service: [name], [name] 10/14/2021 10:00 A M Medical Record Number: [id number] Patient Account Number: [id number] Date of Birth/Sex: Treating RN: 1970-09-06 (51 y.o. 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 10/14/2021 10:00 A M Medical Record Number: [id number] Patient Account Number: [id number] Date of Birth/Sex: Treating RN: 12-22-1970 (51 y.o. 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 The most recent amputation was in August 2022. At that operation, antibiotic beads were placed in the wound. He has been managed by podiatry for his procedures and management of his wounds. He has been in a Recruitment consultant. He is on oral doxycycline. They have been using Betadine wet to dry dressings along with Iodosorb. The patient states that when he thinks the wound is getting too dry, he applies topical Neosporin. At his last visit, on June 7 of this year, the podiatrist determined that he felt the wound was stalled and referred him to wound care for additional evaluation and management. An MRI has been ordered, but not yet scheduled or performed. Pathology from his operation in August 2022 demonstrated findings consistent with chronic osteomyelitis. Today, there is a large irregular wound on the plantar surface of his right foot, at about the level of the fifth metatarsal head. This tracks through to a pinpoint opening on the dorsal lateral portion of his foot. The intake nurse reported purulent drainage. There is some malodor from the wound. No frank necrosis identified. 07/11/2021: Today, the wounds do not connect. I attempted multiple times from various directions and the shared tunnel is no longer open. He has some slough accumulation on the dorsal part of his foot as well as slough and callus buildup on the plantar surface. His MRI is scheduled for June 29. No purulent drainage or malodor appreciated today. 07/19/2021: The lateral foot wound has closed and there is no tunnel connecting the plantar foot wound to that site. The plantar foot wound still probes quite deeply, however. There is some slough, eschar, and nonviable tissue accumulated in the wound bed. No malodorous drainage present. His MRI was performed yesterday and is consistent with fairly extensive osteomyelitis. 07/29/2021: The patient has an appointment with infectious disease on July 18 to treat his osteomyelitis. The plantar wound still probes quite deeply, approaching bone. The orifice has narrowed quite substantially, however, making it more difficult for him to pack. 08/05/2021: The tunnel connecting the lateral foot wound  to the plantar foot wound has reopened. He sees infectious disease tomorrow to discuss long-term antibiotic treatment for osteomyelitis. There was a bit of murky drainage in the wound, but this was noted after he had had topical lidocaine applied so may have just been a blob of the anesthetic. No odor or frank purulent drainage. The wound probes deeply at the midfoot approaching bone. He does have MRI results confirming his diagnosis of osteomyelitis. He has failed to progress with conventional treatment and I think his best chance for preservation of the foot is to initiate hyperbaric oxygen therapy. 08/13/2021: The tunnel connecting  the 2 wounds has closed again. He has a PICC line and is getting IV daptomycin and cefepime. EKG and chest x-ray are within normal limits. He is scheduled to start hyperbaric oxygen therapy tomorrow. The wound in his midfoot probes deeply, approaching bone. The skin at the orifice continues to heaped up and threatens to close over despite the large cavity within. No erythema, induration, or purulent drainage. The wound on his lateral foot is fairly small and quite clean. 08/19/2021: The lateral foot wound has nearly closed. The wound in his midfoot does not probe quite as deeply today. The skin at the orifice continues to try to roll in and obscure the opening. No frankly necrotic tissue appreciated. He is tolerating hyperbaric oxygen therapy. 08/26/2021: The lateral foot wound has closed completely. The wound in his midfoot is shallower again today. The wound orifice is contracting. We are using gentamicin and silver alginate. He continues on IV daptomycin and cefepime and is tolerating hyperbaric oxygen therapy without difficulty. 09/02/2021: His foot is a little bit sore today but he was up walking on it all weekend doing back to school shopping with his daughter. The tunneling is down to about 3 cm and I do not appreciate bone at the tip of the probe. The wound is clean  but has some callus creating an overhanging lip at the distal aspect. He continues to receive hyperbaric oxygen therapy as well as IV daptomycin and cefepime. 09/09/2021: His wound continues to improve. The tunnel has come in by 0.9 cm. He continues to form callus around the orifice of the wound. He is tolerating hyperbaric oxygen therapy along with his IV antibiotics. 09/16/2021: The tunneling is down to just half a centimeter. He continues to build up callus around the orifice of the wound. He completed his course of IV antibiotics and his PICC line is scheduled to be removed this Friday. He is tolerating hyperbaric oxygen without difficulty. We have been applying topical gentamicin and silver alginate to his wound. 09/24/2021: The wound depth has come in even further. There is a little bit of callus buildup around the orifice. The opening is quite narrow, at this point. 09/30/2021: The wound depth is about the same this week. The opening continues to contract with callus accumulation. There is some discoloration of the skin on the lateral part of his foot and he admitted to walking more than usual this weekend and also wearing a different pair shoes. He continues to tolerate hyperbaric oxygen therapy without difficulty. 10/07/2021: The wound depth has contracted a bit and is now approximately 1 to 1.5 cm. The orifice of the wound continues to try and close in over the space. There is just some callus and skin around the margins obscuring the orifice. 10/14/2021: The wound depth has come in a little bit more. The orifice of the wound is rolling inwards with epithelium and callus, making it difficult to pack the wound. Patient History Information obtained from Patient. Social History Never smoker, Marital Status - Married, Alcohol Use - Never, Drug Use - No History, Caffeine Use - Daily - T coffee. ea; Medical History Endocrine Patient has history of Type II Diabetes Hospitalization/Surgery History  - Amuptation of 3rd,4th and 5th toes of Right foot;Oral Surgery;Anal Fissure surgery; Cholecystectomy. Objective Constitutional Vitals Time Taken: 10:23 AM, Height: 72 in, Weight: 312 lbs, BMI: 42.3, Temperature: 97.2 F, Pulse: 81 bpm, Respiratory Rate: 16 breaths/min, Blood Pressure: 130/89 mmHg, Capillary Blood Glucose: 119 mg/dl. Respiratory Normal work of breathing on  room air.. General Notes: 10/14/2021: The wound depth has come in a little bit more. The orifice of the wound is rolling inwards with epithelium and callus, making it difficult to pack the wound. Integumentary (Hair, Skin) Wound #2 status is Open. Original cause of wound was Gradually Appeared. The date acquired was: 12/19/2016. The wound has been in treatment 14 weeks. The wound is located on the Meta. The wound measures 0.1cm length x 0.6cm width x 1.2cm depth; 0.047cm^2 area and 0.057cm^3 volume. There is Fat Layer (Subcutaneous Tissue) exposed. There is no tunneling or undermining noted. There is a medium amount of serosanguineous drainage noted. The wound margin is thickened. There is large (67-100%) pink granulation within the wound bed. There is a small (1-33%) amount of necrotic tissue within the wound bed including Adherent Slough. 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 There was a Selective/Open Wound Skin/Epidermis Debridement with a total area of 0.06 sq cm performed by [name], MD. With the following instrument(s): Curette to remove Non-Viable tissue/material. Material removed includes  Callus and Skin: Epidermis and after achieving pain control using Lidocaine 4% Topical Solution. No specimens were taken. A time out was conducted at 10:37, prior to the start of the procedure. A Minimum amount of bleeding was controlled with Pressure. The procedure was tolerated well with a pain level of 0 throughout and a pain level of 0 following the procedure. Post Debridement Measurements: 0.1cm length x 0.6cm width x 1.2cm depth; 0.057cm^3 volume. Character of Wound/Ulcer Post Debridement is improved. Severity of Tissue Post Debridement is: Fat layer exposed. Post procedure Diagnosis Wound #2: Same as Pre-Procedure General Notes: scribed for [name] by Adline Peals, RN. Plan Follow-up Appointments: Return Appointment in 1 week. - Dr Celine Ahr Room 2 Anesthetic: Wound #2 Right,Lateral,Plantar Foot: (In clinic) Topical Lidocaine 4% applied to wound bed Bathing/ Shower/ Hygiene: Other Bathing/Shower/Hygiene Orders/Instructions: - Change dressing after bathing Off-Loading: Other: - Try to not stand on your feet too much Hyperbaric Oxygen Therapy: Evaluate for HBO Therapy Indication: - wagner grade 3 diabetic foot ulcer right foot 2.0 ATA for 90 Minutes without Air Breaks T Number of Treatments: - 40 more treatments for a total of 80 09/30/21 otal One treatments per day (delivered Monday through Friday unless otherwise specified in Special Instructions below): Finger stick Blood Glucose Pre- and Post- HBOT Treatment. Follow Hyperbaric Oxygen Glycemia Protocol Afrin (Oxymetazoline HCL) 0.05% nasal spray - 1 spray in both nostrils daily as needed prior to HBO treatment for difficulty clearing ears WOUND #2: - Foot Wound Laterality: Plantar, Right, Lateral Cleanser: Soap and Water Every Other Day/15 Days Discharge Instructions: May shower and wash wound with dial antibacterial soap and water prior to dressing change. Cleanser: Wound Cleanser (Generic) Every Other Day/15  Days Discharge Instructions: Cleanse the wound with wound cleanser prior to applying a clean dressing using gauze sponges, not tissue or cotton balls. Peri-Wound Care: cotton tipped applicators (Generic) Every Other Day/15 Days Topical: Gentamicin Every Other Day/15 Days Discharge Instructions: As directed by physician Prim Dressing: Iodoform packing strip 1/4 (in) Every Other Day/15 Days ary Discharge Instructions: Lightly pack as instructed Secondary Dressing: Woven Gauze Sponge, Non-Sterile 4x4 in  Every Other Day/15 Days Discharge Instructions: Apply over primary dressing as directed. Secured With: Paper T ape, 2x10 (in/yd) (Generic) Every Other Day/15 Days Discharge Instructions: Secure dressing with tape as directed. Com pression Wrap: Kerlix Roll 4.5x3.1 (in/yd) (Generic) Every Other Day/15 Days Discharge Instructions: Apply Kerlix and Coban compression as directed. 10/14/2021: The wound depth has come in a little bit more. The orifice of the wound is rolling inwards with epithelium and callus, making it difficult to pack the wound. A curette to debride skin and callus away from the wound orifice to enable ongoing care. He continues to benefit from his hyperbaric oxygen treatments as evidenced by the continued contracture and decrease in the depth of the wound. We will continue to pack his with gentamicin impregnated packing strips. Continue hyperbaric oxygen. Follow-up in 1 week. Electronic Signature(s) Signed: 10/14/2021 10:53:33 AM By: [name] MD FACS Entered By: [name] on 10/14/2021 10:53:33 -------------------------------------------------------------------------------- HxROS Details Patient Name: Date of Service: [name], [name] 10/14/2021 10:00 A M Medical Record Number: [id number] Patient Account Number: [id number] Date of Birth/Sex: Treating RN: [birth year] (51 y.o. [name] Primary Care Provider: [name] Other Clinician: Referring  Provider: Treating Provider/Extender: [name] in Treatment: 14 Information Obtained From Patient Endocrine Medical History: Positive for: Type II Diabetes Immunizations Pneumococcal Vaccine: Received Pneumococcal Vaccination: Yes Received Pneumococcal Vaccination On or After 60th Birthday: No Implantable Devices None Hospitalization / Surgery History Type of Hospitalization/Surgery Amuptation of 3rd,4th and 5th toes of Right foot;Oral Surgery;Anal Fissure surgery; Cholecystectomy Family and Social History Never smoker; Marital Status - Married; Alcohol Use: Never; Drug Use: No History; Caffeine Use: Daily - T coffee; Financial Concerns: No; Food, ea; Clothing or Shelter Needs: No; Support System Lacking: No; Transportation Concerns: No Electronic Signature(s) Signed: 10/14/2021 11:51:09 AM By: [name] MD FACS Signed: 10/14/2021 4:46:26 PM By: Adline Peals Entered By: [name] on 10/14/2021 10:51:38 -------------------------------------------------------------------------------- SuperBill Details Patient Name: Date of Service: [name], [name] 10/14/2021 Medical Record Number: [id number] Patient Account Number: [id number] Date of Birth/Sex: Treating RN: 07/29/1970 (51 y.o. [name] Primary Care Provider: [name] Other Clinician: Referring Provider: Treating Provider/Extender: [name] in Treatment: 14 Diagnosis Coding ICD-10 Codes Code Description E11.49 Type 2 diabetes mellitus with other diabetic neurological complication A26.333 Other chronic osteomyelitis, right ankle and foot L97.514 Non-pressure chronic ulcer of other part of right foot with necrosis of bone Facility Procedures CPT4 Code: 54562563 Description: [phone] - DEBRIDE WOUND 1ST 20 SQ CM OR < ICD-10 Diagnosis Description L97.514 Non-pressure chronic ulcer of other part of right foot with necrosis of  bone Modifier: Quantity: 1 Physician Procedures : CPT4 Code Description Modifier [id number] - WC PHYS LEVEL 4 - EST PT 25 ICD-10 Diagnosis Description L97.514 Non-pressure chronic ulcer of other part of right foot with necrosis of bone M86.671 Other chronic osteomyelitis, right ankle and foot  E11.49 Type 2 diabetes mellitus with other diabetic neurological complication Quantity: 1 : [phone] [id number] - WC PHYS DEBR WO ANESTH 20 SQ CM ICD-10 Diagnosis Description L97.514 Non-pressure chronic ulcer of other part of right foot with necrosis of bone Quantity: 1 Electronic Signature(s) Signed: 10/14/2021 10:53:55 AM By: [name] MD FACS Entered By: [name] on 10/14/2021 10:53:54

## 2021-10-15 ENCOUNTER — Encounter (HOSPITAL_BASED_OUTPATIENT_CLINIC_OR_DEPARTMENT_OTHER): Payer: BLUE CROSS/BLUE SHIELD | Admitting: Internal Medicine

## 2021-10-15 ENCOUNTER — Other Ambulatory Visit: Payer: BLUE CROSS/BLUE SHIELD

## 2021-10-15 DIAGNOSIS — E1169 Type 2 diabetes mellitus with other specified complication: Secondary | ICD-10-CM | POA: Diagnosis not present

## 2021-10-15 DIAGNOSIS — M86671 Other chronic osteomyelitis, right ankle and foot: Secondary | ICD-10-CM

## 2021-10-15 DIAGNOSIS — L97514 Non-pressure chronic ulcer of other part of right foot with necrosis of bone: Secondary | ICD-10-CM

## 2021-10-15 DIAGNOSIS — E1149 Type 2 diabetes mellitus with other diabetic neurological complication: Secondary | ICD-10-CM

## 2021-10-15 LAB — GLUCOSE, CAPILLARY
Glucose-Capillary: 127 mg/dL — ABNORMAL HIGH (ref 70–99)
Glucose-Capillary: 145 mg/dL — ABNORMAL HIGH (ref 70–99)
Glucose-Capillary: 98 mg/dL (ref 70–99)

## 2021-10-15 NOTE — Progress Notes (Addendum)
[name] [name], [name] ([id number]) Visit Report for 10/15/2021 Arrival Information Details Patient Name: Date of Service: [name] [name], [name] [place] [name] 10/15/2021 8:00 A M Medical Record Number: [id number] Patient Account Number: [id number] Date of Birth/Sex: Treating RN: [name] [name], [name] [name] (51 y.o. [name] [name], [name] [name] Primary Care [name] [name]: [name] [name] Other Clinician: Valeria [name] Referring [name] [name]: Treating [name] [name]/Extender: [name] [name] in Treatment: 14 Visit Information History Since Last Visit All ordered tests and consults were completed: Yes Patient Arrived: Ambulatory Added or deleted any medications: No Arrival Time: 07:53 Any new allergies or adverse reactions: No Accompanied By: None Had a fall or experienced change in No Transfer Assistance: None activities of daily living that may affect Patient Identification Verified: Yes risk of falls: Secondary Verification Process Completed: Yes Signs or symptoms of abuse/neglect since last visito No Patient Requires Transmission-Based Precautions: No Hospitalized since last visit: No Patient Has Alerts: Yes Implantable device outside of the clinic excluding No cellular tissue based products placed in the center since last visit: Pain Present Now: No Electronic Signature(s) Signed: 10/15/2021 11:43:03 AM By: [name] [name] EMT Entered By: [name] [name] on 10/15/2021 11:43:[name] -------------------------------------------------------------------------------- Encounter Discharge Information Details Patient Name: Date of Service: [name], [name] 10/15/2021 8:00 A M Medical Record Number: [id number] Patient Account Number: [id number] Date of Birth/Sex: Treating RN: [name] [name] (51 y.o. [name] [name] Primary Care [name] [name]: [name] [name] Other Clinician: Valeria [name] Referring [name] [name]: Treating [name] [name]/Extender: [name] [name] in Treatment: 14 Encounter Discharge Information  Items Discharge Condition: Stable Ambulatory Status: Ambulatory Discharge Destination: Home Transportation: Private Auto Accompanied By: None Schedule Follow-up Appointment: Yes Clinical Summary of Care: Electronic Signature(s) Signed: 10/15/2021 11:52:37 AM By: [name] [name] EMT Entered By: [name] [name] on 10/15/2021 11:52:37 -------------------------------------------------------------------------------- Vitals Details Patient Name: Date of Service: [name], [name] 10/15/2021 8:00 A M Medical Record Number: [id number] Patient Account Number: [id number] Date of Birth/Sex: Treating RN: [birth year] (51 y.o. [name] [name] Primary Care [name] [name]: [name] [name] Other Clinician: Valeria [name] Referring [name] [name]: Treating [name] [name]/Extender: [name] [name] in Treatment: 14 Vital Signs Time Taken: 08:03 Temperature (F): 98.2 Height (in): 72 Pulse (bpm): 90 Weight (lbs): 312 Respiratory Rate (breaths/min): 16 Body Mass Index (BMI): 42.3 Blood Pressure (mmHg): 125/75 Capillary Blood Glucose (mg/dl): 127 Reference Range: 80 - 120 mg / dl Electronic Signature(s) Signed: 10/15/2021 11:44:[name] AM By: [name] [name] EMT Entered By: [name] [name] on 10/15/2021 11:44:[name]

## 2021-10-15 NOTE — Progress Notes (Addendum)
[name], [name] ([id number]) Visit Report for 10/15/2021 HBO Details Patient Name: Date of Service: [name], [place] [name] 10/15/2021 8:00 A M Medical Record Number: [id number] Patient Account Number: [id number] Date of Birth/Sex: Treating RN: September 10, 1970 [name] Primary Care [name]: [name] Other Clinician: [name] Referring Avanish Cerullo: Treating [name]/Extender: [name] in Treatment: 14 HBO Treatment Course Details Treatment Course Number: 1 Ordering [name]: [name] T Treatments Ordered: otal 80 HBO Treatment Start Date: 08/14/2021 HBO Indication: Diabetic Ulcer(s) of the Lower Extremity Standard/Conservative Wound Care tried and failed greater than or equal to 30 days HBO Treatment Details Treatment Number: 44 Patient Type: Outpatient Chamber Type: Monoplace Chamber Serial #: [id number] Treatment Protocol: 2.0 ATA with 90 minutes oxygen, and no air breaks Treatment Details Compression Rate Down: 2.0 psi / minute De-Compression Rate Up: 2.0 psi / minute Air breaks and breathing Decompress Decompress Compress Tx Pressure Begins Reached periods Begins Ends (leave unused spaces blank) Chamber Pressure (ATA 1 2 ------2 1 ) Clock Time (24 hr) 08:36 08:44 - - - - - - 10:14 10:22 Treatment Length: 106 (minutes) Treatment Segments: 4 Vital Signs Capillary Blood Glucose Reference Range: 80 - 120 mg / dl HBO Diabetic Blood Glucose Intervention Range: <131 mg/dl or >249 mg/dl Type: Time Vitals Blood Pulse: Respiratory Capillary Blood Glucose Pulse Action Temperature: Taken: Pressure: Rate: Glucose (mg/dl): Meter #: Oximetry (%) Taken: Pre 08:03 125/75 90 16 98.2 127 Patient given 8 oz glucerna Post 10:28 121/96 84 18 97.2 98 Patient given Glucerna. Pre 08:32 145 Treatment Response Treatment Toleration: Well Treatment Completion Status: Treatment Completed without Adverse Event Treatment Notes At  8546 the patient blood sugar was 127. The patient was given 8oz oz Glucerna. At [phone] Blood sugar recheck was 145. treatment started. Post treatment Blood sugar was 98 at 1028. The patient was given another 8oz Glucerna and waited 51mn before leaving. no complaints from the patient. Dr. HHeber Carolinainformed. Additional Procedure Documentation Tissue Sevierity: Necrosis of bone Physician HBO Attestation: I certify that I supervised this HBO treatment in accordance with Medicare guidelines. A trained emergency response team is readily available per Yes hospital policies and procedures. Continue HBOT as ordered. Yes Electronic Signature(s) Signed: 10/15/2021 4:43:30 PM By: HKalman ShanDO Previous Signature: 10/15/2021 11:51:43 AM Version By: GValeria BatmanEMT Entered By: HKalman Shanon 10/15/2021 16:42:04 -------------------------------------------------------------------------------- HBO Safety Checklist Details Patient Name: Date of Service: [name], [name] 10/15/2021 8:00 A M Medical Record Number: [id number] Account Number: [id number] of Birth/Sex: Treating RN: 318-Jun-1972(51y.o. MErnestene MentionPrimary Care [name]: HGarret ReddishOther Clinician: GValeria BatmanReferring [name]: Treating [name]/Extender: HNelva Bushin Treatment: 14 HBO Safety Checklist Items Safety Checklist Consent Form Signed Patient voided / foley secured and emptied When did you last eato 0700 Last dose of injectable or oral agent 0655 Ostomy pouch emptied and vented if applicable NA All implantable devices assessed, documented and approved NA Intravenous access site secured and place NA Valuables secured Linens and cotton and cotton/polyester blend (less than 51% polyester) Personal oil-based products / skin lotions / body lotions removed Wigs or hairpieces removed NA Smoking or tobacco materials removed Books / newspapers / magazines /  loose paper removed Cologne, aftershave, perfume and deodorant removed Jewelry removed (may wrap wedding band) NA Make-up removed NA Hair care products removed Battery operated devices (external) removed Heating patches and chemical warmers removed Titanium eyewear removed NA Nail polish cured greater  than 10 hours NA Casting material cured greater than 10 hours NA Hearing aids removed NA Loose dentures or partials removed NA Prosthetics have been removed NA Patient demonstrates correct use of air break device (if applicable) Patient concerns have been addressed Patient grounding bracelet on and cord attached to chamber Specifics for Inpatients (complete in addition to above) Medication sheet sent with patient NA Intravenous medications needed or due during therapy sent with patient NA Drainage tubes (e.g. nasogastric tube or chest tube secured and vented) NA Endotracheal or Tracheotomy tube secured NA Cuff deflated of air and inflated with saline NA Airway suctioned NA Notes The safety checklist was done before the treatment was started. Electronic Signature(s) Signed: 10/15/2021 11:45:15 AM By: [name] EMT Entered By: [name] on 10/15/2021 11:45:15

## 2021-10-15 NOTE — Progress Notes (Signed)
[name], [name] ([id number]) Visit Report for 10/15/2021 Problem List Details Patient Name: Date of Service: [name], [name] 10/15/2021 8:00 A M Medical Record Number: [id number] Patient Account Number: [id number] Date of Birth/Sex: Treating RN: [birth year] [name] Primary Care Provider: [name] Other Clinician: [name] Referring Provider: Treating Provider/Extender: [name] in Treatment: 14 Active Problems ICD-10 Encounter Code Description Active Date MDM Diagnosis E11.49 Type 2 diabetes mellitus with other diabetic neurological complication 3/54/5625 No Yes M86.671 Other chronic osteomyelitis, right ankle and foot 07/04/2021 No Yes L97.514 Non-pressure chronic ulcer of other part of right foot with necrosis of bone 07/04/2021 No Yes Inactive Problems Resolved Problems Electronic Signature(s) Signed: 10/15/2021 11:52:10 AM By: [name] EMT Signed: 10/15/2021 4:43:30 PM By: [name] DO Entered By: [name] on 10/15/2021 11:52:10 -------------------------------------------------------------------------------- SuperBill Details Patient Name: Date of Service: [name], [name] 10/15/2021 Medical Record Number: [id number] Patient Account Number: [id number] Date of Birth/Sex: Treating RN: 1970-09-07 [name] Primary Care Provider: [name] Other Clinician: [name] Referring Provider: Treating Provider/Extender: [name] in Treatment: 14 Diagnosis Coding ICD-10 Codes Code Description E11.49 Type 2 diabetes mellitus with other diabetic neurological complication A76.811 Other chronic osteomyelitis, right ankle and foot L97.514 Non-pressure chronic ulcer of other part of right foot with necrosis of bone Facility Procedures CPT4 Code: 57262035 Description: G0277-(Facility Use Only) HBOT full body chamber, 13mn , ICD-10 Diagnosis  Description E11.49 Type 2 diabetes mellitus with other diabetic neurological complication LD97.416Non-pressure chronic ulcer of other part of right foot with necrosis  of bone M86.671 Other chronic osteomyelitis, right ankle and foot Modifier: Quantity: 4 Physician Procedures : CPT4 Code Description Modifier [id number] 68032- WC PHYS HYPERBARIC OXYGEN THERAPY ICD-10 Diagnosis Description E11.49 Type 2 diabetes mellitus with other diabetic neurological complication LZ22.482Non-pressure chronic ulcer of other part of right foot  with necrosis of bone M86.671 Other chronic osteomyelitis, right ankle and foot Quantity: 1 Electronic Signature(s) Signed: 10/15/2021 11:52:05 AM By: GValeria BatmanEMT Signed: 10/15/2021 4:43:30 PM By: HKalman ShanDO Entered By: GValeria Batmanon 10/15/2021 11:52:04

## 2021-10-16 ENCOUNTER — Other Ambulatory Visit: Payer: BLUE CROSS/BLUE SHIELD

## 2021-10-16 ENCOUNTER — Encounter (HOSPITAL_BASED_OUTPATIENT_CLINIC_OR_DEPARTMENT_OTHER): Payer: BLUE CROSS/BLUE SHIELD | Admitting: General Surgery

## 2021-10-16 ENCOUNTER — Other Ambulatory Visit: Payer: Self-pay

## 2021-10-16 ENCOUNTER — Telehealth: Payer: Self-pay | Admitting: Family Medicine

## 2021-10-16 DIAGNOSIS — E1169 Type 2 diabetes mellitus with other specified complication: Secondary | ICD-10-CM | POA: Diagnosis not present

## 2021-10-16 LAB — GLUCOSE, CAPILLARY
Glucose-Capillary: 124 mg/dL — ABNORMAL HIGH (ref 70–99)
Glucose-Capillary: 139 mg/dL — ABNORMAL HIGH (ref 70–99)
Glucose-Capillary: 95 mg/dL (ref 70–99)

## 2021-10-16 MED ORDER — HYDROCHLOROTHIAZIDE 12.5 MG PO CAPS: ORAL_CAPSULE | ORAL | 3 refills | Status: DC

## 2021-10-16 NOTE — Progress Notes (Signed)
[name], [name] ([id number]) Visit Report for 10/16/2021 Problem List Details Patient Name: Date of Service: [name], [name] 10/16/2021 8:00 A M Medical Record Number: [id number] Patient Account Number: [id number] Date of Birth/Sex: Treating RN: 05/02/1970 [name] Primary Care Provider: [name] Other Clinician: [name] Referring Provider: Treating Provider/Extender: [name] in Treatment: 14 Active Problems ICD-10 Encounter Code Description Active Date MDM Diagnosis E11.49 Type 2 diabetes mellitus with other diabetic neurological complication 0/25/4270 No Yes M86.671 Other chronic osteomyelitis, right ankle and foot 07/04/2021 No Yes L97.514 Non-pressure chronic ulcer of other part of right foot with necrosis of bone 07/04/2021 No Yes Inactive Problems Resolved Problems Electronic Signature(s) Signed: 10/16/2021 2:00:01 PM By: [name] EMT Signed: 10/16/2021 3:45:58 PM By: [name] MD FACS Entered By: [name] on 10/16/2021 14:00:00 -------------------------------------------------------------------------------- SuperBill Details Patient Name: Date of Service: [name], [name] 10/16/2021 Medical Record Number: [id number] Patient Account Number: [id number] Date of Birth/Sex: Treating RN: 04-30-1970 (50 y.o. [name], [name] Primary Care Provider: [name] Other Clinician: [name] Referring Provider: Treating Provider/Extender: [name] in Treatment: 14 Diagnosis Coding ICD-10 Codes Code Description E11.49 Type 2 diabetes mellitus with other diabetic neurological complication D17.616 Other chronic osteomyelitis, right ankle and foot L97.514 Non-pressure chronic ulcer of other part of right foot with necrosis of bone Facility Procedures CPT4 Code: 07371062 Description: G0277-(Facility Use Only) HBOT full body chamber, 30mn , ICD-10 Diagnosis  Description E11.49 Type 2 diabetes mellitus with other diabetic neurological complication LI94.854Non-pressure chronic ulcer of other part of right foot with necrosis  of bone M86.671 Other chronic osteomyelitis, right ankle and foot Modifier: Quantity: 4 Physician Procedures : CPT4 Code Description Modifier [id number] 09381- WC PHYS HYPERBARIC OXYGEN THERAPY ICD-10 Diagnosis Description E11.49 Type 2 diabetes mellitus with other diabetic neurological complication LW29.937Non-pressure chronic ulcer of other part of right foot  with necrosis of bone M86.671 Other chronic osteomyelitis, right ankle and foot Quantity: 1 Electronic Signature(s) Signed: 10/16/2021 1:59:55 PM By: GValeria BatmanEMT Signed: 10/16/2021 3:45:58 PM By: CFredirick MaudlinMD FACS Entered By: GValeria Batmanon 10/16/2021 13:59:54

## 2021-10-16 NOTE — Telephone Encounter (Signed)
Caller States: -insurance change, requires medications to be sent to new pharmacy.   Caller requests: hydrochlorothiazide (MICROZIDE) 12.5 MG capsule [[id number]]   Pharmacy:  [id number]  [address]  [place],  [address] [phone]

## 2021-10-16 NOTE — Progress Notes (Addendum)
[name], [name] ([id number]) Visit Report for 10/16/2021 HBO Details Patient Name: Date of Service: [name], [place] [name] 10/16/2021 8:00 A M Medical Record Number: [id number] Patient Account Number: [id number] Date of Birth/Sex: Treating RN: 19-Dec-1970 [name], [name] Primary Care [name]: [name] Other Clinician: [name] Referring Mallika [name]: Treating [name]/Extender: [name] in Treatment: 14 HBO Treatment Course Details Treatment Course Number: 1 Ordering Chai Routh: [name] T Treatments Ordered: otal 80 HBO Treatment Start Date: 08/14/2021 HBO Indication: Diabetic Ulcer(s) of the Lower Extremity Standard/Conservative Wound Care tried and failed greater than or equal to 30 days HBO Treatment Details Treatment Number: 45 Patient Type: Outpatient Chamber Type: Monoplace Chamber Serial #: [id number] Treatment Protocol: 2.0 ATA with 90 minutes oxygen, and no air breaks Treatment Details Compression Rate Down: 2.0 psi / minute De-Compression Rate Up: 2.0 psi / minute Air breaks and breathing Decompress Decompress Compress Tx Pressure Begins Reached periods Begins Ends (leave unused spaces blank) Chamber Pressure (ATA 1 2 ------2 1 ) Clock Time (24 hr) 08:46 08:56 - - - - - - 10:26 10:34 Treatment Length: 108 (minutes) Treatment Segments: 4 Vital Signs Capillary Blood Glucose Reference Range: 80 - 120 mg / dl HBO Diabetic Blood Glucose Intervention Range: <131 mg/dl or >249 mg/dl Type: Time Vitals Blood Pulse: Respiratory Capillary Blood Glucose Pulse Action Temperature: Taken: Pressure: Rate: Glucose (mg/dl): Meter #: Oximetry (%) Taken: Pre 07:56 124/78 85 16 98.4 124 Patient given 8 oz glucerna Post 10:38 125/77 81 18 97.2 95 Patient given 8 oz glucerna Pre 08:42 139 Treatment Response Treatment Toleration: Well Treatment Completion Status: Treatment Completed without Adverse Event Treatment Notes At  6144 the patient blood sugar was 124. The patient was given 8oz oz Glucerna. At [phone] Blood sugar recheck was 139. treatment started. Post treatment Blood sugar was 95 at 1038. The patient was given another 8oz Glucerna and waited 70mn before leaving. No complaints from the patient. Dr. CCeline Ahrinformed. Additional Procedure Documentation Tissue Sevierity: Necrosis of bone Physician HBO Attestation: I certify that I supervised this HBO treatment in accordance with Medicare guidelines. A trained emergency response team is readily available per Yes hospital policies and procedures. Continue HBOT as ordered. Yes Electronic Signature(s) Signed: 10/16/2021 3:47:00 PM By: CFredirick MaudlinMD FACS Previous Signature: 10/16/2021 1:59:27 PM Version By: GValeria BatmanEMT Entered By: CFredirick Maudlinon 10/16/2021 15:47:00 -------------------------------------------------------------------------------- HBO Safety Checklist Details Patient Name: Date of Service: [name], [name] 10/16/2021 8:00 A M Medical Record Number: [id number] Account Number: [id number] of Birth/Sex: Treating RN: 309-03-1970(51y.o. [name] BMeta.RedingPrimary Care [name]: HGarret ReddishOther Clinician: GValeria BatmanReferring Kloee Ballew: Treating [name]/Extender: CDelman Kittenin Treatment: 14 HBO Safety Checklist Items Safety Checklist Consent Form Signed Patient voided / foley secured and emptied When did you last eato 0615 Last dose of injectable or oral agent 0620 Ostomy pouch emptied and vented if applicable NA All implantable devices assessed, documented and approved NA Intravenous access site secured and place NA Valuables secured Linens and cotton and cotton/polyester blend (less than 51% polyester) Personal oil-based products / skin lotions / body lotions removed Wigs or hairpieces removed NA Smoking or tobacco materials removed Books / newspapers / magazines /  loose paper removed Cologne, aftershave, perfume and deodorant removed Jewelry removed (may wrap wedding band) NA Make-up removed NA Hair care products removed Battery operated devices (external) removed Heating patches and chemical warmers removed Titanium eyewear removed NA Nail  polish cured greater than 10 hours NA Casting material cured greater than 10 hours NA Hearing aids removed NA Loose dentures or partials removed NA Prosthetics have been removed NA Patient demonstrates correct use of air break device (if applicable) Patient concerns have been addressed Patient grounding bracelet on and cord attached to chamber Specifics for Inpatients (complete in addition to above) Medication sheet sent with patient Intravenous medications needed or due during therapy sent with patient Drainage tubes (e.g. nasogastric tube or chest tube secured and vented) Endotracheal or Tracheotomy tube secured Cuff deflated of air and inflated with saline Airway suctioned Notes The safety checklist was done before the treatment was started. Electronic Signature(s) Signed: 10/16/2021 2:43:06 PM By: [name] EMT Previous Signature: 10/16/2021 1:55:13 PM Version By: [name] EMT Entered By: [name] on 10/16/2021 14:43:06

## 2021-10-16 NOTE — Telephone Encounter (Signed)
Rx sent to below pharmacy.

## 2021-10-16 NOTE — Progress Notes (Addendum)
[name], Antione M. ([id number]) Visit Report for 10/16/2021 Arrival Information Details Patient Name: Date of Service: [place], [place] [name] 10/16/2021 8:00 A M Medical Record Number: [id number] Patient Account Number: [id number] Date of Birth/Sex: Treating RN: 1970/06/21 (51 y.o. [name], [name] Primary Care Aquila Delaughter: [name] Other Clinician: [name] Referring Haeden Hudock: Treating Alyson Ki/Extender: [name] in Treatment: 14 Visit Information History Since Last Visit All ordered tests and consults were completed: Yes Patient Arrived: Ambulatory Added or deleted any medications: No Arrival Time: 07:41 Any new allergies or adverse reactions: No Accompanied By: None Had a fall or experienced change in No Transfer Assistance: None activities of daily living that may affect Patient Identification Verified: Yes risk of falls: Secondary Verification Process Completed: Yes Signs or symptoms of abuse/neglect since last visito No Patient Requires Transmission-Based Precautions: No Hospitalized since last visit: No Patient Has Alerts: Yes Implantable device outside of the clinic excluding No cellular tissue based products placed in the center since last visit: Pain Present Now: No Electronic Signature(s) Signed: 10/16/2021 1:53:20 PM By: [name] EMT Entered By: [name] on 10/16/2021 13:53:20 -------------------------------------------------------------------------------- Encounter Discharge Information Details Patient Name: Date of Service: [name], [name] 10/16/2021 8:00 A M Medical Record Number: [id number] Patient Account Number: [id number] Date of Birth/Sex: Treating RN: [birth year] (51 y.o. [name] Primary Care Deb Loudin: [name] Other Clinician: [name] Referring Miner Koral: Treating Tremaine Fuhriman/Extender: [name] in Treatment: 14 Encounter Discharge Information  Items Discharge Condition: Stable Ambulatory Status: Ambulatory Discharge Destination: Home Transportation: Private Auto Accompanied By: None Schedule Follow-up Appointment: Yes Clinical Summary of Care: Electronic Signature(s) Signed: 10/16/2021 2:01:35 PM By: [name] EMT Entered By: [name] on 10/16/2021 14:01:35 -------------------------------------------------------------------------------- Vitals Details Patient Name: Date of Service: [name], [name] 10/16/2021 8:00 A M Medical Record Number: [id number] Patient Account Number: [id number] Date of Birth/Sex: Treating RN: 1970/04/01 (51 y.o. [name], [name] Primary Care Chaunda Vandergriff: [name] Other Clinician: [name] Referring Tommie Dejoseph: Treating Moody Robben/Extender: [name] in Treatment: 14 Vital Signs Time Taken: 07:56 Temperature (F): 98.4 Height (in): 72 Pulse (bpm): 85 Weight (lbs): 312 Respiratory Rate (breaths/min): 16 Body Mass Index (BMI): 42.3 Blood Pressure (mmHg): 124/78 Capillary Blood Glucose (mg/dl): 124 Reference Range: 80 - 120 mg / dl Electronic Signature(s) Signed: 10/16/2021 1:53:50 PM By: [name] EMT Entered By: [name] on 10/16/2021 13:53:50

## 2021-10-17 ENCOUNTER — Other Ambulatory Visit: Payer: BLUE CROSS/BLUE SHIELD

## 2021-10-17 ENCOUNTER — Encounter (HOSPITAL_BASED_OUTPATIENT_CLINIC_OR_DEPARTMENT_OTHER): Payer: BLUE CROSS/BLUE SHIELD | Admitting: General Surgery

## 2021-10-17 ENCOUNTER — Other Ambulatory Visit: Payer: Self-pay

## 2021-10-17 DIAGNOSIS — E1169 Type 2 diabetes mellitus with other specified complication: Secondary | ICD-10-CM | POA: Diagnosis not present

## 2021-10-17 DIAGNOSIS — M86171 Other acute osteomyelitis, right ankle and foot: Secondary | ICD-10-CM

## 2021-10-17 LAB — GLUCOSE, CAPILLARY
Glucose-Capillary: 132 mg/dL — ABNORMAL HIGH (ref 70–99)
Glucose-Capillary: 150 mg/dL — ABNORMAL HIGH (ref 70–99)
Glucose-Capillary: 178 mg/dL — ABNORMAL HIGH (ref 70–99)

## 2021-10-17 LAB — CBC
HCT: 29.3 % — ABNORMAL LOW (ref 38.5–50.0)
Hemoglobin: 9.4 g/dL — ABNORMAL LOW (ref 13.2–17.1)
MCH: 25.6 pg — ABNORMAL LOW (ref 27.0–33.0)
MCHC: 32.1 g/dL (ref 32.0–36.0)
MCV: 79.8 fL — ABNORMAL LOW (ref 80.0–100.0)
MPV: 10.2 fL (ref 7.5–12.5)
Platelets: 185 10*3/uL (ref 140–400)
RBC: 3.67 10*6/uL — ABNORMAL LOW (ref 4.20–5.80)
RDW: 13.1 % (ref 11.0–15.0)
WBC: 7.6 10*3/uL (ref 3.8–10.8)

## 2021-10-17 NOTE — Progress Notes (Addendum)
[name], Caidin M. ([id number]) Visit Report for 10/17/2021 HBO Details Patient Name: Date of Service: [name], [place] [name] 10/17/2021 8:00 A M Medical Record Number: [id number] Patient Account Number: [id number] Date of Birth/Sex: Treating RN: [birth year] (51 y.o. [name] Primary Care Arturo Freundlich: [name] Other Clinician: [name] Referring Tymar Polyak: Treating Chasyn Cinque/Extender: [name] in Treatment: 15 HBO Treatment Course Details Treatment Course Number: 1 Ordering Teniqua Marron: [name] T Treatments Ordered: otal 80 HBO Treatment Start Date: 08/14/2021 HBO Indication: Diabetic Ulcer(s) of the Lower Extremity Standard/Conservative Wound Care tried and failed greater than or equal to 30 days HBO Treatment Details Treatment Number: 46 Patient Type: Outpatient Chamber Type: Monoplace Chamber Serial #: [id number] Treatment Protocol: 2.0 ATA with 90 minutes oxygen, and no air breaks Treatment Details Compression Rate Down: 2.0 psi / minute De-Compression Rate Up: 2.0 psi / minute Air breaks and breathing Decompress Decompress Compress Tx Pressure Begins Reached periods Begins Ends (leave unused spaces blank) Chamber Pressure (ATA 1 2 ------2 1 ) Clock Time (24 hr) 08:07 08:16 - - - - - - 08:46 09:53 Treatment Length: 106 (minutes) Treatment Segments: 4 Vital Signs Capillary Blood Glucose Reference Range: 80 - 120 mg / dl HBO Diabetic Blood Glucose Intervention Range: <131 mg/dl or >249 mg/dl Time Vitals Blood Respiratory Capillary Blood Glucose Pulse Action Type: Pulse: Temperature: Taken: Pressure: Rate: Glucose (mg/dl): Meter #: Oximetry (%) Taken: Pre 07:59 121/83 100 16 98.3 132 Post 09:57 121/75 91 18 97.9 150 Treatment Response Treatment Toleration: Well Treatment Completion Status: Treatment Completed without Adverse Event Additional Procedure Documentation Tissue Sevierity: Necrosis of bone Physician HBO  Attestation: I certify that I supervised this HBO treatment in accordance with Medicare guidelines. A trained emergency response team is readily available per Yes hospital policies and procedures. Continue HBOT as ordered. Yes Electronic Signature(s) Signed: 10/17/2021 2:56:22 PM By: [name] MD FACS Previous Signature: 10/17/2021 12:48:43 PM Version By: [name] EMT Entered By: [name] on 10/17/2021 14:56:22 -------------------------------------------------------------------------------- HBO Safety Checklist Details Patient Name: Date of Service: [name], [name] 10/17/2021 8:00 A M Medical Record Number: [id number] Patient Account Number: [id number] Date of Birth/Sex: Treating RN: 07-Dec-1970 (51 y.o. [name] Primary Care Idona Stach: [name] Other Clinician: [name] Referring Carime Dinkel: Treating Jahmil Macleod/Extender: [name] in Treatment: 15 HBO Safety Checklist Items Safety Checklist Consent Form Signed Patient voided / foley secured and emptied When did you last eato 0715 Last dose of injectable or oral agent 0635 Ostomy pouch emptied and vented if applicable NA All implantable devices assessed, documented and approved NA Intravenous access site secured and place NA Valuables secured Linens and cotton and cotton/polyester blend (less than 51% polyester) Personal oil-based products / skin lotions / body lotions removed Wigs or hairpieces removed NA Smoking or tobacco materials removed Books / newspapers / magazines / loose paper removed Cologne, aftershave, perfume and deodorant removed Jewelry removed (may wrap wedding band) NA Make-up removed NA Hair care products removed Battery operated devices (external) removed Heating patches and chemical warmers removed Titanium eyewear removed NA Nail polish cured greater than 10 hours NA Casting material cured greater than 10 hours NA Hearing  aids removed NA Loose dentures or partials removed NA Prosthetics have been removed NA Patient demonstrates correct use of air break device (if applicable) Patient concerns have been addressed Patient grounding bracelet on and cord attached to chamber Specifics for Inpatients (complete in addition to above) Medication sheet sent with patient  NA Intravenous medications needed or due during therapy sent with patient NA Drainage tubes (e.g. nasogastric tube or chest tube secured and vented) NA Endotracheal or Tracheotomy tube secured NA Cuff deflated of air and inflated with saline NA Airway suctioned NA Notes The safety checklist was done before the treatment was started. Electronic Signature(s) Signed: 10/17/2021 12:47:24 PM By: [name] EMT Entered By: [name] on 10/17/2021 12:47:23

## 2021-10-17 NOTE — Progress Notes (Signed)
[name], [name] ([id number]) Visit Report for 10/17/2021 Arrival Information Details Patient Name: Date of Service: [name], [name] 10/17/2021 8:00 A M Medical Record Number: [id number] Patient Account Number: [id number] Date of Birth/Sex: Treating RN: Jun 17, 1970 [name], [name] Primary Care [name]: [name] Other Clinician: Valeria [name] Referring [name]: Treating [name]/Extender: [name] in Treatment: 15 Visit Information History Since Last Visit All ordered tests and consults were completed: Yes Patient Arrived: Ambulatory Added or deleted any medications: No Arrival Time: 07:43 Any new allergies or adverse reactions: No Accompanied By: None Had a fall or experienced change in No Transfer Assistance: None activities of daily living that may affect Patient Identification Verified: Yes risk of [name]: Secondary Verification Process Completed: Yes Signs or symptoms of abuse/neglect since last visito No Patient Requires Transmission-Based Precautions: No Hospitalized since last visit: No Patient Has Alerts: Yes Implantable device outside of the clinic excluding No cellular tissue based products placed in the center since last visit: Pain Present Now: No Electronic Signature(s) Signed: 10/17/2021 12:45:57 PM By: [name] EMT Entered By: [name] on 10/17/2021 12:45:56 -------------------------------------------------------------------------------- Encounter Discharge Information Details Patient Name: Date of Service: [name], [name] 10/17/2021 8:00 A M Medical Record Number: [id number] Patient Account Number: [id number] Date of Birth/Sex: Treating RN: 16-May-1970 [name] Primary Care [name]: [name] Other Clinician: Valeria [name] Referring Lashai Grosch: Treating [name]/Extender: [name] in Treatment: 15 Encounter Discharge Information  Items Discharge Condition: Stable Ambulatory Status: Ambulatory Discharge Destination: Home Transportation: Private Auto Accompanied By: None Schedule Follow-up Appointment: Yes Clinical Summary of Care: Electronic Signature(s) Signed: 10/17/2021 12:52:05 PM By: [name] EMT Entered By: [name] on 10/17/2021 12:52:05 -------------------------------------------------------------------------------- Vitals Details Patient Name: Date of Service: [name], [name] 10/17/2021 8:00 A M Medical Record Number: [id number] Patient Account Number: [id number] Date of Birth/Sex: Treating RN: April 12, 1970 [name] Primary Care [name]: [name] Other Clinician: Valeria [name] Referring [name]: Treating [name]/Extender: [name] in Treatment: 15 Vital Signs Time Taken: 07:59 Temperature (F): 98.3 Height (in): 72 Pulse (bpm): 100 Weight (lbs): 312 Respiratory Rate (breaths/min): 16 Body Mass Index (BMI): 42.3 Blood Pressure (mmHg): 121/83 Capillary Blood Glucose (mg/dl): 132 Reference Range: 80 - 120 mg / dl Electronic Signature(s) Signed: 10/17/2021 12:46:24 PM By: [name] EMT Entered By: [name] on 10/17/2021 12:46:24

## 2021-10-17 NOTE — Progress Notes (Signed)
[name], [name] ([id number]) Visit Report for 10/17/2021 Problem List Details Patient Name: Date of Service: [name], [name] 10/17/2021 8:00 A M Medical Record Number: [id number] Patient Account Number: [id number] Date of Birth/Sex: Treating RN: [birth year] 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 10/17/2021 Medical Record Number: [id number] Patient Account Number: [id number] Date of Birth/Sex: Treating RN: 21-Mar-1970 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 LG81.856Non-pressure chronic ulcer of other part of right foot with necrosis  of bone M86.671 Other chronic osteomyelitis, right ankle and foot Modifier: Quantity: 4 Physician Procedures : CPT4 Code Description Modifier [id number] PHYS HYPERBARIC OXYGEN THERAPY ICD-10 Diagnosis Description 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

## 2021-10-18 ENCOUNTER — Other Ambulatory Visit: Payer: Self-pay | Admitting: Internal Medicine

## 2021-10-18 ENCOUNTER — Encounter (HOSPITAL_BASED_OUTPATIENT_CLINIC_OR_DEPARTMENT_OTHER): Payer: BLUE CROSS/BLUE SHIELD | Admitting: General Surgery

## 2021-10-18 DIAGNOSIS — E1169 Type 2 diabetes mellitus with other specified complication: Secondary | ICD-10-CM | POA: Diagnosis not present

## 2021-10-18 LAB — GLUCOSE, CAPILLARY
Glucose-Capillary: 157 mg/dL — ABNORMAL HIGH (ref 70–99)
Glucose-Capillary: 127 mg/dL — ABNORMAL HIGH (ref 70–99)

## 2021-10-18 MED ORDER — LINEZOLID 600 MG PO TABS: 600.0000 mg | ORAL_TABLET | Freq: Two times a day (BID) | ORAL | 0 refills | Status: AC

## 2021-10-18 MED ORDER — LEVOFLOXACIN 750 MG PO TABS: 750.0000 mg | ORAL_TABLET | Freq: Every day | ORAL | 0 refills | Status: AC

## 2021-10-18 NOTE — Progress Notes (Signed)
[name] [name], [name] ([id number]) Visit Report for 10/18/2021 Arrival Information Details Patient Name: Date of Service: [name] [name], [name] 10/18/2021 8:00 A M Medical Record Number: [id number] Patient Account Number: [id number] Date of Birth/Sex: Treating RN: Feb 21, 1970 (51 y.o. [name] [name], [name] Primary Care [name] Other Clinician: [name] Referring Wise Fees: Treating [name]: [name] [name]5 Visit Information History Since Last Visit All ordered tests and consults were completed: Yes Patient Arrived: Ambulatory Added or deleted any medications: No Arrival Time: 07:53 Any new allergies or adverse reactions: No Accompanied By: None Had a fall or experienced change [name] No Transfer Assistance: None activities of daily living that may affect Patient Identification Verified: Yes risk of falls: Secondary Verification Process Completed: Yes Signs or symptoms of abuse/neglect since last visito No Patient Requires Transmission-Based Precautions: No Hospitalized since last visit: No Patient Has Alerts: Yes Implantable device outside of the clinic excluding No cellular tissue based products placed [name] the center since last visit: Pain Present Now: No Electronic Signature(s) Signed: 10/18/2021 2:51:31 PM By: [name] EMT Entered By: [name] on 10/18/2021 14:51:31 -------------------------------------------------------------------------------- Encounter Discharge Information Details Patient Name: Date of Service: [name] [name], [name] 10/18/2021 8:00 A M Medical Record Number: [id number] Patient Account Number: [id number] Date of Birth/Sex: Treating RN: 04/07/1970 (51 y.o. [name] [name] Primary Care [name]: [name] Other Clinician: [name] Referring [name]: Treating [name]: [name] [name]5 Encounter Discharge Information  Items Discharge Condition: Stable Ambulatory Status: Ambulatory Discharge Destination: Home Transportation: Private Auto Accompanied By: None Schedule Follow-up Appointment: Yes Clinical Summary of Care: Electronic Signature(s) Signed: 10/18/2021 2:55:29 PM By: [name] EMT Entered By: [name] on 10/18/2021 14:55:29 -------------------------------------------------------------------------------- Vitals Details Patient Name: Date of Service: [name] [name], [name] 10/18/2021 8:00 A M Medical Record Number: [id number] Patient Account Number: [id number] Date of Birth/Sex: Treating RN: [birth year] (51 y.o. [name] [name] Primary Care [name]: [name] Other Clinician: [name] Referring [name]: Treating [name]: [name] [name]5 Vital Signs Time Taken: 08:02 Temperature (F): 98.2 Height ([name]: 72 Pulse (bpm): 99 Weight (lbs): 312 Respiratory Rate (breaths/min): 18 Body Mass Index (BMI): 42.3 Blood Pressure (mmHg): 138/70 Capillary Blood Glucose (mg/dl): 157 Reference Range: 80 - 120 mg / dl Electronic Signature(s) Signed: 10/18/2021 2:52:16 PM By: [name] EMT Entered By: [name] on 10/18/2021 14:52:16

## 2021-10-18 NOTE — Progress Notes (Addendum)
[name], [name] ([id number]) Visit Report for 10/18/2021 HBO Details Patient Name: Date of Service: [name], Baker [name] 10/18/2021 8:00 A M Medical Record Number: [id number] Patient Account Number: [id number] Date of Birth/Sex: Treating RN: December 30, 1970 (51 y.o. [name] [name]: [name] Other Clinician: [name] Referring [name]: Treating [name]/Extender: [name] in Treatment: 15 HBO Treatment Course Details Treatment Course Number: 1 Ordering [name]: [name] T Treatments Ordered: otal 80 HBO Treatment Start Date: 08/14/2021 HBO Indication: Diabetic Ulcer(s) of the Lower Extremity Standard/Conservative Wound Care tried and failed greater than or equal to 30 days HBO Treatment Details Treatment Number: 47 Patient Type: Outpatient Chamber Type: Monoplace Chamber Serial #: [id number] Treatment Protocol: 2.0 ATA with 90 minutes oxygen, and no air breaks Treatment Details Compression Rate Down: 2.0 psi / minute De-Compression Rate Up: 2.0 psi / minute Air breaks and breathing Decompress Decompress Compress Tx Pressure Begins Reached periods Begins Ends (leave unused spaces blank) Chamber Pressure (ATA 1 2 ------2 1 ) Clock Time (24 hr) 08:12 08:21 - - - - - - 09:51 09:59 Treatment Length: 107 (minutes) Treatment Segments: 4 Vital Signs Capillary Blood Glucose Reference Range: 80 - 120 mg / dl HBO Diabetic Blood Glucose Intervention Range: <131 mg/dl or >249 mg/dl Time Vitals Blood Respiratory Capillary Blood Glucose Pulse Action Type: Pulse: Temperature: Taken: Pressure: Rate: Glucose (mg/dl): Meter #: Oximetry (%) Taken: Pre 08:02 138/70 99 18 98.2 157 Post 10:05 141/84 85 16 97.2 127 Treatment Response Treatment Toleration: Well Treatment Completion Status: Treatment Completed without Adverse Event Additional Procedure Documentation Tissue Sevierity: Necrosis of bone Physician HBO  Attestation: I certify that I supervised this HBO treatment in accordance with Medicare guidelines. A trained emergency response team is readily available per Yes hospital policies and procedures. Continue HBOT as ordered. Yes Electronic Signature(s) Signed: 10/18/2021 3:15:05 PM By: [name] MD FACS Previous Signature: 10/18/2021 2:54:34 PM Version By: [name] EMT Entered By: [name] on 10/18/2021 15:15:05 -------------------------------------------------------------------------------- HBO Safety Checklist Details Patient Name: Date of Service: [name], [name] 10/18/2021 8:00 A M Medical Record Number: [id number] Patient Account Number: [id number] Date of Birth/Sex: Treating RN: August 29, 1970 (51 y.o. [name] [name]: [name] Other Clinician: [name] Referring [name]: Treating [name]/Extender: [name] in Treatment: 15 HBO Safety Checklist Items Safety Checklist Consent Form Signed Patient voided / foley secured and emptied When did you last eato 0705 Last dose of injectable or oral agent 0705 Ostomy pouch emptied and vented if applicable NA All implantable devices assessed, documented and approved NA Intravenous access site secured and place NA Valuables secured Linens and cotton and cotton/polyester blend (less than 51% polyester) Personal oil-based products / skin lotions / body lotions removed Wigs or hairpieces removed NA Smoking or tobacco materials removed Books / newspapers / magazines / loose paper removed Cologne, aftershave, perfume and deodorant removed Jewelry removed (may wrap wedding band) NA Make-up removed NA Hair care products removed Battery operated devices (external) removed Heating patches and chemical warmers removed Titanium eyewear removed NA Nail polish cured greater than 10 hours NA Casting material cured greater than 10 hours NA Hearing aids  removed NA Loose dentures or partials removed NA Prosthetics have been removed NA Patient demonstrates correct use of air break device (if applicable) Patient concerns have been addressed Patient grounding bracelet on and cord attached to chamber Specifics for Inpatients (complete in addition to above) Medication sheet sent with patient  NA Intravenous medications needed or due during therapy sent with patient NA Drainage tubes (e.g. nasogastric tube or chest tube secured and vented) NA Endotracheal or Tracheotomy tube secured NA Cuff deflated of air and inflated with saline NA Airway suctioned NA Notes The safety checklist was done before the treatment was started. Electronic Signature(s) Signed: 10/18/2021 2:53:26 PM By: [name] EMT Entered By: [name] on 10/18/2021 14:53:26

## 2021-10-18 NOTE — Progress Notes (Signed)
[name], Rayner M. ([id number]) Visit Report for 10/18/2021 Problem List Details Patient Name: Date of Service: [name], [name] 10/18/2021 8:00 A M Medical Record Number: [id number] Patient Account Number: [id number] Date of Birth/Sex: Treating RN: 1970-12-19 (51 y.o. 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 10/18/2021 Medical Record Number: [id number] Patient Account Number: [id number] Date of Birth/Sex: Treating RN: 10-29-1970 (51 y.o. 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 LE31.540Non-pressure chronic ulcer of other part of right foot with necrosis  of bone M86.671 Other chronic osteomyelitis, right ankle and foot Modifier: Quantity: 4 Physician Procedures : CPT4 Code Description Modifier [id number] PHYS HYPERBARIC OXYGEN THERAPY ICD-10 Diagnosis Description 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

## 2021-10-21 ENCOUNTER — Encounter (HOSPITAL_BASED_OUTPATIENT_CLINIC_OR_DEPARTMENT_OTHER): Payer: BLUE CROSS/BLUE SHIELD | Admitting: Internal Medicine

## 2021-10-21 ENCOUNTER — Encounter (HOSPITAL_BASED_OUTPATIENT_CLINIC_OR_DEPARTMENT_OTHER): Payer: BLUE CROSS/BLUE SHIELD | Attending: General Surgery | Admitting: General Surgery

## 2021-10-21 DIAGNOSIS — E11621 Type 2 diabetes mellitus with foot ulcer: Secondary | ICD-10-CM | POA: Insufficient documentation

## 2021-10-21 DIAGNOSIS — L97514 Non-pressure chronic ulcer of other part of right foot with necrosis of bone: Secondary | ICD-10-CM | POA: Diagnosis not present

## 2021-10-21 DIAGNOSIS — E1149 Type 2 diabetes mellitus with other diabetic neurological complication: Secondary | ICD-10-CM | POA: Insufficient documentation

## 2021-10-21 DIAGNOSIS — M86671 Other chronic osteomyelitis, right ankle and foot: Secondary | ICD-10-CM | POA: Diagnosis not present

## 2021-10-21 DIAGNOSIS — E1169 Type 2 diabetes mellitus with other specified complication: Secondary | ICD-10-CM | POA: Diagnosis present

## 2021-10-21 LAB — GLUCOSE, CAPILLARY
Glucose-Capillary: 177 mg/dL — ABNORMAL HIGH (ref 70–99)
Glucose-Capillary: 218 mg/dL — ABNORMAL HIGH (ref 70–99)

## 2021-10-21 NOTE — Progress Notes (Addendum)
[name], [name] ([id number]) Visit Report for 10/21/2021 Arrival Information Details Patient Name: Date of Service: [name], [name] [place] [name] 10/21/2021 8:45 A M Medical Record Number: [id number] Patient Account Number: [id number] Date of Birth/Sex: Treating RN: [birth year] (51 y.o. [name] Primary Care Coyle Stordahl: [name] Other Clinician: [name] Referring Talal Fritchman: Treating Chevonne Bostrom/Extender: [name]5 Visit Information History Since Last Visit All ordered tests and consults were completed: Yes Patient Arrived: Ambulatory Added or deleted any medications: No Arrival Time: 08:08 Any new allergies or adverse reactions: No Accompanied By: None Had a fall or experienced change [name] No Transfer Assistance: None activities of daily living that may affect Patient Identification Verified: Yes risk of falls: Secondary Verification Process Completed: Yes Signs or symptoms of abuse/neglect since last visito No Patient Requires Transmission-Based Precautions: No Hospitalized since last visit: No Patient Has Alerts: Yes Implantable device outside of the clinic excluding No cellular tissue based products placed [name] the center since last visit: Pain Present Now: No Electronic Signature(s) Signed: 10/21/2021 11:30:50 AM By: [name] EMT Entered By: [name] on 10/21/2021 11:30:50 -------------------------------------------------------------------------------- Encounter Discharge Information Details Patient Name: Date of Service: [name], [name] 10/21/2021 8:45 A M Medical Record Number: [id number] Patient Account Number: [id number] Date of Birth/Sex: Treating RN: 1970/03/09 (51 y.o. [name] Primary Care Anesha Hackert: [name] Other Clinician: [name] Referring Tessy Pawelski: Treating Joni Norrod/Extender: [name]5 Encounter Discharge Information  Items Discharge Condition: Stable Ambulatory Status: Ambulatory Discharge Destination: Home Transportation: Private Auto Accompanied By: None Schedule Follow-up Appointment: Yes Clinical Summary of Care: Electronic Signature(s) Signed: 10/21/2021 11:36:13 AM By: [name] EMT Entered By: [name] on 10/21/2021 11:36:13 -------------------------------------------------------------------------------- Vitals Details Patient Name: Date of Service: [name], [name] 10/21/2021 8:45 A M Medical Record Number: [id number] Patient Account Number: [id number] Date of Birth/Sex: Treating RN: [birth year] (51 y.o. [name] Primary Care Syndi Pua: [name] Other Clinician: [name] Referring Madisen Ludvigsen: Treating Tyrion Glaude/Extender: [name]5 Vital Signs Time Taken: 08:12 Temperature (F): 98.6 Height ([name]: 72 Pulse (bpm): 97 Weight (lbs): 312 Respiratory Rate (breaths/min): 16 Body Mass Index (BMI): 42.3 Blood Pressure (mmHg): 135/84 Capillary Blood Glucose (mg/dl): 177 Reference Range: 80 - 120 mg / dl Electronic Signature(s) Signed: 10/21/2021 11:32:01 AM By: [name] EMT Entered By: [name] on 10/21/2021 11:32:01

## 2021-10-21 NOTE — Progress Notes (Signed)
[name], Tarez M. ([id number]) Visit Report for 10/21/2021 Arrival Information Details Patient Name: Date of Service: [name], [name] Pump [name] 10/21/2021 7:45 A M Medical Record Number: [id number] Patient Account Number: [id number] Date of Birth/Sex: Treating RN: [birth year] [name] Primary Care [name]: [name] Other Clinician: Referring Harmonii [name]: Treating [name]: [name]5 Visit Information History Since Last Visit Added or deleted any medications: No Patient Arrived: Ambulatory Any new allergies or adverse reactions: No Arrival Time: 07:39 Had a fall or experienced change [name] No Accompanied By: self activities of daily living that may affect Transfer Assistance: None risk of falls: Patient Identification Verified: Yes Signs or symptoms of abuse/neglect since last visito No Secondary Verification Process Completed: Yes Hospitalized since last visit: No Patient Requires Transmission-Based Precautions: No Implantable device outside of the clinic excluding No Patient Has Alerts: Yes cellular tissue based products placed [name] the center since last visit: Has Dressing [name] Place as Prescribed: Yes Pain Present Now: No Electronic Signature(s) Signed: 10/21/2021 3:38:31 PM By: Adline Peals Entered By: Adline Peals on 10/21/2021 07:41:12 -------------------------------------------------------------------------------- Encounter Discharge Information Details Patient Name: Date of Service: [name], [name] 10/21/2021 7:45 A M Medical Record Number: [id number] Patient Account Number: [id number] Date of Birth/Sex: Treating RN: 01/05/1971 [name] Primary Care [name]: [name] Other Clinician: Referring [name]: Treating [name]/Extender: [name]5 Encounter Discharge Information Items Post Procedure Vitals Discharge  Condition: Stable Temperature (F): 98.1 Ambulatory Status: Ambulatory Pulse (bpm): 103 Discharge Destination: Home Respiratory Rate (breaths/min): 18 Transportation: Private Auto Blood Pressure (mmHg): 147/81 Accompanied By: self Schedule Follow-up Appointment: Yes Clinical Summary of Care: Patient Declined Electronic Signature(s) Signed: 10/21/2021 3:38:31 PM By: Adline Peals Entered By: Adline Peals on 10/21/2021 08:25:23 -------------------------------------------------------------------------------- Lower Extremity Assessment Details Patient Name: Date of Service: [name], [name] 10/21/2021 7:45 A M Medical Record Number: [id number] Patient Account Number: [id number] Date of Birth/Sex: Treating RN: 02/21/1970 [name] Primary Care [name]: [name] Other Clinician: Referring Jazper Nikolai: Treating [name]: [name]5 Edema Assessment Assessed: [name]: No] [name]: No] Edema: [Left: N] [Right: o] Calf Left: Right: Point of Measurement: From Medial Instep 41.3 cm Ankle Left: Right: Point of Measurement: From Medial Instep 24 cm Vascular Assessment Pulses: Dorsalis Pedis Palpable: [Right:Yes] Electronic Signature(s) Signed: 10/21/2021 3:38:31 PM By: Adline Peals Entered By: Adline Peals on 10/21/2021 07:43:30 -------------------------------------------------------------------------------- Multi Wound Chart Details Patient Name: Date of Service: [name], [name] 10/21/2021 7:45 A M Medical Record Number: [id number] Patient Account Number: [id number] Date of Birth/Sex: Treating RN: 1970/06/21 (51 y.o. M) Primary Care [name]: [name] Other Clinician: Referring [name]: Treating [name]: [name]5 Vital Signs Height([name]: 72 Pulse(bpm): 103 Weight(lbs): 312 Blood Pressure(mmHg): 147/81 Body  Mass Index(BMI): 42.3 Temperature(F): 98.1 Respiratory Rate(breaths/min): 18 Photos: [2:Right, Lateral, Plantar Foot] [N/A:N/A N/A] Wound Location: [2:Gradually Appeared] [N/A:N/A] Wounding Event: [2:Diabetic Wound/Ulcer of the Lower] [N/A:N/A] Primary Etiology: [2:Extremity Type II Diabetes] [N/A:N/A] Comorbid History: [2:12/19/2016] [N/A:N/A] Date Acquired: [2:15] [N/A:N/A] Weeks of Treatment: [2:Open] [N/A:N/A] Wound Status: [2:No] [N/A:N/A] Wound Recurrence: [2:0.1x1.7x1.2] [N/A:N/A] Measurements L x W x D (cm) [2:0.134] [N/A:N/A] A (cm) : rea [2:0.16] [N/A:N/A] Volume (cm) : [2:96.30%] [N/A:N/A] % Reduction [name] A [2:rea: 99.10%] [N/A:N/A] % Reduction [name] Volume: [2:Grade 3] [N/A:N/A] Classification: [2:Medium] [N/A:N/A] Exudate A mount: [2:Serosanguineous] [N/A:N/A] Exudate Type: [2:red, brown] [N/A:N/A] Exudate Color: [2:Thickened] [N/A:N/A] Wound  Margin: [2:Large (67-100%)] [N/A:N/A] Granulation A mount: [2:Pink] [N/A:N/A] Granulation Quality: [2:Small (1-33%)] [N/A:N/A] Necrotic A mount: [2:Fat Layer (Subcutaneous Tissue): Yes N/A] Exposed Structures: [2:Fascia: No Tendon: No Muscle: No Joint: No Bone: No Small (1-33%)] [N/A:N/A] Epithelialization: [2:Debridement - Selective/Open Wound N/A] Debridement: Pre-procedure Verification/Time Out 07:49 [N/A:N/A] Taken: [2:Lidocaine 4% Topical Solution] [N/A:N/A] Pain Control: [2:Callus] [N/A:N/A] Tissue Debrided: [2:Skin/Epidermis] [N/A:N/A] Level: [2:0.17] [N/A:N/A] Debridement A (sq cm): [2:rea Curette] [N/A:N/A] Instrument: [2:Minimum] [N/A:N/A] Bleeding: [2:Pressure] [N/A:N/A] Hemostasis A chieved: [2:0] [N/A:N/A] Procedural Pain: [2:0] [N/A:N/A] Post Procedural Pain: [2:Procedure was tolerated well] [N/A:N/A] Debridement Treatment Response: [2:0.1x1.7x1.2] [N/A:N/A] Post Debridement Measurements L x W x D (cm) [2:0.16] [N/A:N/A] Post Debridement Volume: (cm) [2:Debridement] [N/A:N/A] Treatment Notes Electronic  Signature(s) Signed: 10/21/2021 7:54:16 AM By: [name] MD FACS Entered By: [name] on 10/21/2021 07:54:16 -------------------------------------------------------------------------------- Multi-Disciplinary Care Plan Details Patient Name: Date of Service: [name], [name] 10/21/2021 7:45 A M Medical Record Number: [id number] Patient Account Number: [id number] Date of Birth/Sex: Treating RN: 1970-11-05 [name] Primary Care Cola Highfill: [name] Other Clinician: Referring Tayvin Preslar: Treating Lyrick Lagrand/Extender: [name]: [place] reviewed with physician Active Inactive Nutrition Nursing Diagnoses: Impaired glucose control: actual or potential Potential for alteratiion [name] Nutrition/Potential for imbalanced nutrition Goals: Patient/caregiver will maintain therapeutic glucose control Date Initiated: 07/29/2021 Target Resolution Date: 11/19/2021 Goal Status: Active Interventions: Assess patient nutrition upon admission and as needed per policy Provide education on elevated blood sugars and impact on wound healing Treatment Activities: Patient referred to Primary Care Physician for further nutritional evaluation : 07/29/2021 Notes: Osteomyelitis Nursing Diagnoses: Infection: osteomyelitis Knowledge deficit related to disease process and management Goals: Patient/caregiver will verbalize understanding of disease process and disease management Date Initiated: 07/29/2021 Target Resolution Date: 11/19/2021 Goal Status: Active Patient's osteomyelitis will resolve Date Initiated: 07/29/2021 Target Resolution Date: 11/19/2021 Goal Status: Active Interventions: Assess for signs and symptoms of osteomyelitis resolution every visit Provide education on osteomyelitis Treatment Activities: Consult for HBO : 07/29/2021 Notes: Wound/Skin Impairment Nursing Diagnoses: Impaired tissue  integrity Goals: Patient/caregiver will verbalize understanding of skin care regimen Date Initiated: 07/04/2021 Target Resolution Date: 11/19/2021 Goal Status: Active Interventions: Assess ulceration(s) every visit Treatment Activities: Skin care regimen initiated : 07/04/2021 Notes: Electronic Signature(s) Signed: 10/21/2021 3:38:31 PM By: Adline Peals Entered By: Adline Peals on 10/21/2021 07:58:22 -------------------------------------------------------------------------------- Pain Assessment Details Patient Name: Date of Service: [name], [name] 10/21/2021 7:45 A M Medical Record Number: [id number] Patient Account Number: [id number] Date of Birth/Sex: Treating RN: 28-Jan-1970 [name] Primary Care Tashyra Adduci: [name] Other Clinician: Referring Devyn Sheerin: Treating Zorion Nims/Extender: [name]5 Active Problems Location of Pain Severity and Description of Pain Patient Has Paino No Site Locations Rate the pain. Current Pain Level: 0 Pain Management and Medication Current Pain Management: Electronic Signature(s) Signed: 10/21/2021 3:38:31 PM By: Adline Peals Entered By: Adline Peals on 10/21/2021 07:41:47 -------------------------------------------------------------------------------- Patient/Caregiver Education Details Patient Name: Date of Service: [name], [name] 10/2/2023andnbsp7:45 A M Medical Record Number: [id number] Patient Account Number: [id number] Date of Birth/Gender: Treating RN: 1970-12-28 [name] Primary Care Physician: [name] Other Clinician: Referring Physician: Treating Physician/Extender: [name]5 Education Assessment Education Provided To: Patient Education Topics Provided Wound/Skin Impairment: Methods: Explain/Verbal Responses: Reinforcements needed, State content  correctly Electronic Signature(s) Signed: 10/21/2021 3:38:31 PM By: Adline Peals Entered By: Adline Peals on 10/21/2021 08:24:40 -------------------------------------------------------------------------------- Wound Assessment  Details Patient Name: Date of Service: [name], [name] 10/21/2021 7:45 A M Medical Record Number: [id number] Patient Account Number: [id number] Date of Birth/Sex: Treating RN: 1970-10-22 [name] Primary Care Gianni Mihalik: [name] Other Clinician: Referring Hadleigh Felber: Treating Sheenah Dimitroff/Extender: [name]5 Wound Status Wound Number: 2 Primary Etiology: Diabetic Wound/Ulcer of the Lower Extremity Wound Location: Right, Lateral, Plantar Foot Wound Status: Open Wounding Event: Gradually Appeared Comorbid History: Type II Diabetes Date Acquired: 12/19/2016 Weeks Of Treatment: 15 Clustered Wound: No Photos Wound Measurements Length: (cm) 0.1 Width: (cm) 1.7 Depth: (cm) 1.2 Area: (cm) 0.134 Volume: (cm) 0.16 % Reduction [name] Area: 96.3% % Reduction [name] Volume: 99.1% Epithelialization: Small (1-33%) Tunneling: No Undermining: No Wound Description Classification: Grade 3 Wound Margin: Thickened Exudate Amount: Medium Exudate Type: Serosanguineous Exudate Color: red, brown Foul Odor After Cleansing: No Slough/Fibrino Yes Wound Bed Granulation Amount: Large (67-100%) Exposed Structure Granulation Quality: Pink Fascia Exposed: No Necrotic Amount: Small (1-33%) Fat Layer (Subcutaneous Tissue) Exposed: Yes Necrotic Quality: Adherent Slough Tendon Exposed: No Muscle Exposed: No Joint Exposed: No Bone Exposed: No Treatment Notes Wound #2 (Foot) Wound Laterality: Plantar, Right, Lateral Cleanser Soap and Water Discharge Instruction: May shower and wash wound with dial antibacterial soap and water prior to dressing change. Wound Cleanser Discharge Instruction:  Cleanse the wound with wound cleanser prior to applying a clean dressing using gauze sponges, not tissue or cotton balls. Peri-Wound Care cotton tipped applicators Topical Gentamicin Discharge Instruction: As directed by physician Primary Dressing Iodoform packing strip 1/4 ([name] Discharge Instruction: Lightly pack as instructed Secondary Dressing Woven Gauze Sponge, Non-Sterile 4x4 [name] Discharge Instruction: Apply over primary dressing as directed. Secured With Paper Tape, 2x10 ([name]/yd) Discharge Instruction: Secure dressing with tape as directed. Compression Wrap Kerlix Roll 4.5x3.1 ([name]/yd) Discharge Instruction: Apply Kerlix and Coban compression as directed. Compression Stockings Add-Ons Electronic Signature(s) Signed: 10/21/2021 3:38:31 PM By: Adline Peals Entered By: Adline Peals on 10/21/2021 07:45:39 -------------------------------------------------------------------------------- Vitals Details Patient Name: Date of Service: [name], [name] 10/21/2021 7:45 A M Medical Record Number: [id number] Patient Account Number: [id number] Date of Birth/Sex: Treating RN: [birth year] [name] Primary Care Damier Disano: [name] Other Clinician: Referring Ruthella Kirchman: Treating Curt Oatis/Extender: [name]5 Vital Signs Time Taken: 07:41 Temperature (F): 98.1 Height ([name]: 72 Pulse (bpm): 103 Weight (lbs): 312 Respiratory Rate (breaths/min): 18 Body Mass Index (BMI): 42.3 Blood Pressure (mmHg): 147/81 Reference Range: 80 - 120 mg / dl Electronic Signature(s) Signed: 10/21/2021 3:38:31 PM By: Adline Peals Entered By: Adline Peals on 10/21/2021 07:41:41

## 2021-10-21 NOTE — Progress Notes (Signed)
[name], [name] ([id number]) Visit Report for 10/21/2021 Chief Complaint Document Details Patient Name: Date of Service: [name], [name] 10/21/2021 7:45 A M Medical Record Number: [id number] Patient Account Number: [id number] Date of Birth/Sex: Treating RN: 10/02/1970 (51 y.o. M) Primary Care Provider: [name] Other Clinician: Referring Provider: Treating Provider/Extender: [name] in Treatment: 15 Information Obtained from: Patient Chief Complaint Patients presents for treatment of an open diabetic ulcer Electronic Signature(s) Signed: 10/21/2021 7:54:29 AM By: [name] MD FACS Entered By: [name] on 10/21/2021 07:54:29 -------------------------------------------------------------------------------- Debridement Details Patient Name: Date of Service: [name], [name] 10/21/2021 7:45 A M Medical Record Number: [id number] Patient Account Number: [id number] Date of Birth/Sex: Treating RN: Mar 04, 1970 (51 y.o. [name] Primary Care Provider: [name] Other Clinician: Referring Provider: Treating Provider/Extender: [name] in Treatment: 15 Debridement Performed for Assessment: Wound #2 Right,Lateral,Plantar Foot Performed By: Physician [name], MD Debridement Type: Debridement Severity of Tissue Pre Debridement: Fat layer exposed Level of Consciousness (Pre-procedure): Awake and Alert Pre-procedure Verification/Time Out Yes - 07:49 Taken: Start Time: 07:49 Pain Control: Lidocaine 4% T opical Solution T Area Debrided (L x W): otal 0.1 (cm) x 1.7 (cm) = 0.17 (cm) Tissue and other material debrided: Non-Viable, Callus, Skin: Epidermis Level: Skin/Epidermis Debridement Description: Selective/Open Wound Instrument: Curette Bleeding: Minimum Hemostasis Achieved: Pressure Procedural Pain: 0 Post Procedural Pain: 0 Response to Treatment: Procedure was tolerated  well Level of Consciousness (Post- Awake and Alert procedure): Post Debridement Measurements of Total Wound Length: (cm) 0.1 Width: (cm) 1.7 Depth: (cm) 1.2 Volume: (cm) 0.16 Character of Wound/Ulcer Post Debridement: Improved Severity of Tissue Post Debridement: Fat layer exposed Post Procedure Diagnosis Same as Pre-procedure Notes scribed for [name] by Adline Peals, RN Electronic Signature(s) Signed: 10/21/2021 9:17:55 AM By: [name] MD FACS Signed: 10/21/2021 3:38:31 PM By: Adline Peals Entered By: Adline Peals on 10/21/2021 07:52:03 -------------------------------------------------------------------------------- HPI Details Patient Name: Date of Service: [name], [name] 10/21/2021 7:45 A M Medical Record Number: [id number] Patient Account Number: [id number] Date of Birth/Sex: Treating RN: [birth year] (51 y.o. 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 The most recent amputation was in August 2022. At that operation, antibiotic beads were placed in the wound. He has been managed by podiatry for his procedures and management of his wounds. He has been in a Water engineer. He is on oral doxycycline. They have been using Betadine wet to dry dressings along with Iodosorb. The patient states that when he thinks the wound is getting too dry, he applies topical Neosporin. At his last visit, on June 7 of this year, the podiatrist determined that he felt the wound was stalled and referred him to wound care for additional evaluation and management. An MRI has been ordered, but not yet scheduled  or performed. Pathology from his operation in August 2022 demonstrated findings consistent with chronic osteomyelitis. Today, there is a large irregular wound on the plantar surface of his right foot, at about the level of the fifth metatarsal head. This tracks through to a pinpoint opening on the dorsal lateral portion of his foot. The intake nurse reported purulent drainage. There is  some malodor from the wound. No frank necrosis identified. 07/11/2021: Today, the wounds do not connect. I attempted multiple times from various directions and the shared tunnel is no longer open. He has some slough accumulation on the dorsal part of his foot as well as slough and callus buildup on the plantar surface. His MRI is scheduled for June 29. No purulent drainage or malodor appreciated today. 07/19/2021: The lateral foot wound has closed and there is no tunnel connecting the plantar foot wound to that site. The plantar foot wound still probes quite deeply, however. There is some slough, eschar, and nonviable tissue accumulated in the wound bed. No malodorous drainage present. His MRI was performed yesterday and is consistent with fairly extensive osteomyelitis. 07/29/2021: The patient has an appointment with infectious disease on July 18 to treat his osteomyelitis. The plantar wound still probes quite deeply, approaching bone. The orifice has narrowed quite substantially, however, making it more difficult for him to pack. 08/05/2021: The tunnel connecting the lateral foot wound to the plantar foot wound has reopened. He sees infectious disease tomorrow to discuss long-term antibiotic treatment for osteomyelitis. There was a bit of murky drainage in the wound, but this was noted after he had had topical lidocaine applied so may have just been a blob of the anesthetic. No odor or frank purulent drainage. The wound probes deeply at the midfoot approaching bone. He does have MRI results confirming his diagnosis of  osteomyelitis. He has failed to progress with conventional treatment and I think his best chance for preservation of the foot is to initiate hyperbaric oxygen therapy. 08/13/2021: The tunnel connecting the 2 wounds has closed again. He has a PICC line and is getting IV daptomycin and cefepime. EKG and chest x-ray are within normal limits. He is scheduled to start hyperbaric oxygen therapy tomorrow. The wound in his midfoot probes deeply, approaching bone. The skin at the orifice continues to heaped up and threatens to close over despite the large cavity within. No erythema, induration, or purulent drainage. The wound on his lateral foot is fairly small and quite clean. 08/19/2021: The lateral foot wound has nearly closed. The wound in his midfoot does not probe quite as deeply today. The skin at the orifice continues to try to roll in and obscure the opening. No frankly necrotic tissue appreciated. He is tolerating hyperbaric oxygen therapy. 08/26/2021: The lateral foot wound has closed completely. The wound in his midfoot is shallower again today. The wound orifice is contracting. We are using gentamicin and silver alginate. He continues on IV daptomycin and cefepime and is tolerating hyperbaric oxygen therapy without difficulty. 09/02/2021: His foot is a little bit sore today but he was up walking on it all weekend doing back to school shopping with his daughter. The tunneling is down to about 3 cm and I do not appreciate bone at the tip of the probe. The wound is clean but has some callus creating an overhanging lip at the distal aspect. He continues to receive hyperbaric oxygen therapy as well as IV daptomycin and cefepime. 09/09/2021: His wound continues to improve. The tunnel has come in by 0.9 cm. He continues to form callus around the orifice of the wound. He is tolerating hyperbaric oxygen therapy along with his IV antibiotics. 09/16/2021: The tunneling is down to just half a centimeter. He  continues to build up callus around the orifice of the wound. He completed his course of IV antibiotics and his PICC line is scheduled to be  removed this Friday. He is tolerating hyperbaric oxygen without difficulty. We have been applying topical gentamicin and silver alginate to his wound. 09/24/2021: The wound depth has come in even further. There is a little bit of callus buildup around the orifice. The opening is quite narrow, at this point. 09/30/2021: The wound depth is about the same this week. The opening continues to contract with callus accumulation. There is some discoloration of the skin on the lateral part of his foot and he admitted to walking more than usual this weekend and also wearing a different pair shoes. He continues to tolerate hyperbaric oxygen therapy without difficulty. 10/07/2021: The wound depth has contracted a bit and is now approximately 1 to 1.5 cm. The orifice of the wound continues to try and close in over the space. There is just some callus and skin around the margins obscuring the orifice. 10/14/2021: The wound depth has come in a little bit more. The orifice of the wound is rolling inwards with epithelium and callus, making it difficult to pack the wound. 10/21/2021: There is still 1 portion of the wound, at the lateral aspect, that is still a couple of centimeters deep. The architecture of the wound makes this somewhat challenging to access and pack. Everything else looks clean. He has his usual accumulation of callus and skin, narrowing the orifice. Electronic Signature(s) Signed: 10/21/2021 7:56:01 AM By: [name] MD FACS Entered By: [name] on 10/21/2021 07:56:01 -------------------------------------------------------------------------------- Physical Exam Details Patient Name: Date of Service: [name], [name] 10/21/2021 7:45 A M Medical Record Number: [id number] Patient Account Number: [id number] Date of Birth/Sex: Treating RN: 05-09-1970 (51  y.o. M) Primary Care Provider: [name] Other Clinician: Referring Provider: Treating Provider/Extender: [name] in Treatment: 15 Constitutional Slightly hypertensive. Slightly tachycardic, asymptomatic. . . No acute distress.Marland Kitchen Respiratory Normal work of breathing on room air.. Notes 10/21/2021: There is still 1 portion of the wound, at the lateral aspect, that is still a couple of centimeters deep. The architecture of the wound makes this somewhat challenging to access and pack. Everything else looks clean. He has his usual accumulation of callus and skin, narrowing the orifice. Electronic Signature(s) Signed: 10/21/2021 7:56:33 AM By: [name] MD FACS Entered By: [name] on 10/21/2021 07:56:33 -------------------------------------------------------------------------------- Physician Orders Details Patient Name: Date of Service: [name], [name] 10/21/2021 7:45 A M Medical Record Number: [id number] Patient Account Number: [id number] Date of Birth/Sex: Treating RN: 1970/06/04 (51 y.o. 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 - Dr Celine Ahr Room 2 Return A Anesthetic Wound #2 Catharine (In clinic) Topical Lidocaine 4% applied to wound bed Bathing/ Shower/ Hygiene Other Bathing/Shower/Hygiene Orders/Instructions: - Change dressing after bathing Off-Loading Other: - Try to not stand on your feet too much Hyperbaric Oxygen  Therapy Evaluate for HBO Therapy Indication: - wagner grade 3 diabetic foot ulcer right foot 2.0 ATA for 90 Minutes without A Breaks ir Total Number of Treatments: - 40 more treatments for a total of 80 09/30/21 One treatments per day (delivered Monday through Friday unless otherwise specified in Special Instructions below): Finger stick Blood  Glucose Pre- and Post- HBOT Treatment. Follow Hyperbaric Oxygen Glycemia Protocol Afrin (Oxymetazoline HCL) 0.05% nasal spray - 1 spray in both nostrils daily as needed prior to HBO treatment for difficulty clearing ears Wound Treatment Wound #2 - Foot Wound Laterality: Plantar, Right, Lateral Cleanser: Soap and Water Every Other Day/15 Days Discharge Instructions: May shower and wash wound with dial antibacterial soap and water prior to dressing change. Cleanser: Wound Cleanser (Generic) Every Other Day/15 Days Discharge Instructions: Cleanse the wound with wound cleanser prior to applying a clean dressing using gauze sponges, not tissue or cotton balls. Peri-Wound Care: cotton tipped applicators (Generic) Every Other Day/15 Days Topical: Gentamicin Every Other Day/15 Days Discharge Instructions: As directed by physician Prim Dressing: Iodoform packing strip 1/4 (in) Every Other Day/15 Days ary Discharge Instructions: Lightly pack as instructed Secondary Dressing: Woven Gauze Sponge, Non-Sterile 4x4 in Every Other Day/15 Days Discharge Instructions: Apply over primary dressing as directed. Secured With: Paper Tape, 2x10 (in/yd) (Generic) Every Other Day/15 Days Discharge Instructions: Secure dressing with tape as directed. Compression Wrap: Kerlix Roll 4.5x3.1 (in/yd) (Generic) Every Other Day/15 Days Discharge Instructions: Apply Kerlix and Coban compression as directed. Patient Medications llergies: No Known Allergies A Notifications Medication Indication Start End 10/21/2021 lidocaine DOSE topical 4 % cream - cream topical GLYCEMIA  INTERVENTIONS PROTOCOL PRE-HBO GLYCEMIA INTERVENTIONS ACTION INTERVENTION Obtain pre-HBO capillary blood glucose (ensure 1 physician order is in chart). A. Notify HBO physician and await physician orders. 2 If result is 70 mg/dl or below: B. If the result meets the hospital definition of a critical result, follow hospital policy. A. Give patient an 8 ounce Glucerna Shake, an 8 ounce Ensure, or 8 ounces of a Glucerna/Ensure equivalent dietary supplement*. B. Wait 30 minutes. If result is 71 mg/dl to 130 mg/dl: C. Retest patients capillary blood glucose (CBG). D. If result greater than or equal to 110 mg/dl, proceed with HBO. If result less than 110 mg/dl, notify HBO physician and consider holding HBO. If result is 131 mg/dl to 249 mg/dl: A. Proceed with HBO. A. Notify HBO physician and await physician orders. B. It is recommended to hold HBO and do If result is 250 mg/dl or greater: blood/urine ketone testing. C. If the result meets the hospital definition of a critical result, follow hospital policy. POST-HBO GLYCEMIA INTERVENTIONS ACTION INTERVENTION Obtain post HBO capillary blood glucose (ensure 1 physician order is in chart). A. Notify HBO physician and await physician orders. 2 If result is 70 mg/dl or below: B. If the result meets the hospital definition of a critical result, follow hospital policy. A. Give patient an 8 ounce Glucerna Shake, an 8 ounce Ensure, or 8 ounces of a Glucerna/Ensure equivalent dietary supplement*. B. Wait 15 minutes for symptoms of If result is 71 mg/dl to 100 mg/dl: hypoglycemia (i.e. nervousness, anxiety, sweating, chills, clamminess, irritability, confusion, tachycardia or dizziness). C. If patient asymptomatic, discharge patient. If patient symptomatic, repeat capillary blood glucose (CBG) and notify HBO physician. If result is 101 mg/dl to 249 mg/dl: A. Discharge patient. A. Notify HBO physician and await  physician orders. B. It is recommended to do blood/urine ketone If result is 250 mg/dl or greater: testing. C. If the result meets the hospital definition of a critical result, follow hospital policy. *Juice or candies are NOT equivalent products. If patient refuses the Glucerna or Ensure, please consult the hospital dietitian for an appropriate substitute. Electronic Signature(s) Signed: 10/21/2021 9:17:55 AM By: [name] MD FACS Entered By: [name] on 10/21/2021 07:57:32 --------------------------------------------------------------------------------  Problem List Details Patient Name: Date of Service: [name], [name] 10/21/2021 7:45 A M Medical Record Number: [id number] Patient Account Number: [id number] Date of Birth/Sex: Treating RN: 1970-09-19 (51 y.o. 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 10/21/2021 7:45 A M Medical Record Number: [id number] Patient Account Number: [id number] Date of Birth/Sex: Treating RN: 1970-08-07 (51 y.o. 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 The most recent amputation was in August 2022. At that operation, antibiotic beads were placed in the wound. He has been managed by podiatry for his procedures and management of his wounds. He has been in a Water engineer. He is on oral doxycycline. They have been using Betadine wet to dry dressings along with Iodosorb. The patient states that when he thinks the wound is getting too dry, he applies topical Neosporin. At his last visit, on June 7 of this year, the podiatrist determined that he felt the wound was stalled and referred him to wound care for additional evaluation and management. An MRI has been ordered, but not yet scheduled or performed. Pathology from his operation in August 2022 demonstrated findings consistent with chronic osteomyelitis. Today, there is a large irregular wound on the plantar surface of his right foot, at about the level of the fifth metatarsal head. This tracks through to a pinpoint opening on the dorsal lateral portion of his foot. The intake nurse reported purulent drainage. There is some malodor from the wound. No frank necrosis identified. 07/11/2021: Today, the wounds do not connect. I attempted multiple times from various directions and the shared tunnel is no longer open. He has some slough accumulation on the dorsal part of his foot as well as slough and callus buildup on the plantar surface. His MRI is scheduled for June 29. No purulent drainage or malodor appreciated today. 07/19/2021: The lateral foot wound has closed and there is no tunnel connecting the plantar foot  wound to that site. The plantar foot wound still probes quite deeply, however. There is some slough, eschar, and nonviable tissue accumulated in the wound bed. No malodorous drainage present. His MRI was performed yesterday and is consistent with fairly extensive osteomyelitis. 07/29/2021: The patient has an appointment with infectious disease on July 18 to treat his osteomyelitis. The plantar wound still probes quite deeply, approaching bone. The orifice has narrowed quite substantially, however, making it more difficult for him to pack. 08/05/2021: The tunnel connecting the lateral foot wound to the plantar foot wound has reopened. He sees infectious disease tomorrow to discuss  long-term antibiotic treatment for osteomyelitis. There was a bit of murky drainage in the wound, but this was noted after he had had topical lidocaine applied so may have just been a blob of the anesthetic. No odor or frank purulent drainage. The wound probes deeply at the midfoot approaching bone. He does have MRI results confirming his diagnosis of osteomyelitis. He has failed to progress with conventional treatment and I think his best chance for preservation of the foot is to initiate hyperbaric oxygen therapy. 08/13/2021: The tunnel connecting the 2 wounds has closed again. He has a PICC line and is getting IV daptomycin and cefepime. EKG and chest x-ray are within normal limits. He is scheduled to start hyperbaric oxygen therapy tomorrow. The wound in his midfoot probes deeply, approaching bone. The skin at the orifice continues to heaped up and threatens to close over despite the large cavity within. No erythema, induration, or purulent drainage. The wound on his lateral foot is fairly small and quite clean. 08/19/2021: The lateral foot wound has nearly closed. The wound in his midfoot does not probe quite as deeply today. The skin at the orifice continues to try to roll in and obscure the opening. No frankly necrotic  tissue appreciated. He is tolerating hyperbaric oxygen therapy. 08/26/2021: The lateral foot wound has closed completely. The wound in his midfoot is shallower again today. The wound orifice is contracting. We are using gentamicin and silver alginate. He continues on IV daptomycin and cefepime and is tolerating hyperbaric oxygen therapy without difficulty. 09/02/2021: His foot is a little bit sore today but he was up walking on it all weekend doing back to school shopping with his daughter. The tunneling is down to about 3 cm and I do not appreciate bone at the tip of the probe. The wound is clean but has some callus creating an overhanging lip at the distal aspect. He continues to receive hyperbaric oxygen therapy as well as IV daptomycin and cefepime. 09/09/2021: His wound continues to improve. The tunnel has come in by 0.9 cm. He continues to form callus around the orifice of the wound. He is tolerating hyperbaric oxygen therapy along with his IV antibiotics. 09/16/2021: The tunneling is down to just half a centimeter. He continues to build up callus around the orifice of the wound. He completed his course of IV antibiotics and his PICC line is scheduled to be removed this Friday. He is tolerating hyperbaric oxygen without difficulty. We have been applying topical gentamicin and silver alginate to his wound. 09/24/2021: The wound depth has come in even further. There is a little bit of callus buildup around the orifice. The opening is quite narrow, at this point. 09/30/2021: The wound depth is about the same this week. The opening continues to contract with callus accumulation. There is some discoloration of the skin on the lateral part of his foot and he admitted to walking more than usual this weekend and also wearing a different pair shoes. He continues to tolerate hyperbaric oxygen therapy without difficulty. 10/07/2021: The wound depth has contracted a bit and is now approximately 1 to 1.5 cm. The  orifice of the wound continues to try and close in over the space. There is just some callus and skin around the margins obscuring the orifice. 10/14/2021: The wound depth has come in a little bit more. The orifice of the wound is rolling inwards with epithelium and callus, making it difficult to pack the wound. 10/21/2021: There is still 1 portion of  the wound, at the lateral aspect, that is still a couple of centimeters deep. The architecture of the wound makes this somewhat challenging to access and pack. Everything else looks clean. He has his usual accumulation of callus and skin, narrowing the orifice. Patient History Information obtained from Patient. Social History Never smoker, Marital Status - Married, Alcohol Use - Never, Drug Use - No History, Caffeine Use - Daily - T coffee. ea; Medical History Endocrine Patient has history of Type II Diabetes Hospitalization/Surgery History - Amuptation of 3rd,4th and 5th toes of Right foot;Oral Surgery;Anal Fissure surgery; Cholecystectomy. Objective Constitutional Slightly hypertensive. Slightly tachycardic, asymptomatic. No acute distress.. Vitals Time Taken: 7:41 AM, Height: 72 in, Weight: 312 lbs, BMI: 42.3, Temperature: 98.1 F, Pulse: 103 bpm, Respiratory Rate: 18 breaths/min, Blood Pressure: 147/81 mmHg. Respiratory Normal work of breathing on room air.. General Notes: 10/21/2021: There is still 1 portion of the wound, at the lateral aspect, that is still a couple of centimeters deep. The architecture of the wound makes this somewhat challenging to access and pack. Everything else looks clean. He has his usual accumulation of callus and skin, narrowing the orifice. Integumentary (Hair, Skin) Wound #2 status is Open. Original cause of wound was Gradually Appeared. The date acquired was: 12/19/2016. The wound has been in treatment 15 weeks. The wound is located on the [place]. The wound measures 0.1cm length x 1.7cm  width x 1.2cm depth; 0.134cm^2 area and 0.16cm^3 volume. There is Fat Layer (Subcutaneous Tissue) exposed. There is no tunneling or undermining noted. There is a medium amount of serosanguineous drainage noted. The wound margin is thickened. There is large (67-100%) pink granulation within the wound bed. There is a small (1-33%) amount of necrotic tissue within the wound bed including Adherent Slough. 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 There was a Selective/Open Wound Skin/Epidermis Debridement with a total area of 0.17 sq cm performed by [name], MD. With the following instrument(s): Curette to remove Non-Viable tissue/material. Material removed includes Callus and Skin: Epidermis and after achieving pain control using Lidocaine 4% Topical Solution. No specimens were taken. A time out was conducted at 07:49, prior to the start of the procedure. A Minimum amount of bleeding was controlled with Pressure. The procedure was tolerated well with a pain level of 0 throughout and a pain level of 0 following the procedure. Post Debridement Measurements: 0.1cm length x 1.7cm width x 1.2cm depth; 0.16cm^3 volume. Character of Wound/Ulcer Post Debridement is improved. Severity of Tissue Post Debridement is: Fat layer exposed. Post procedure Diagnosis Wound #2: Same as Pre-Procedure General Notes: scribed for [name] by Adline Peals, RN. Plan Follow-up Appointments: Return Appointment in 1 week. - Dr Celine Ahr Room 2 Anesthetic: Wound #2 Right,Lateral,Plantar Foot: (In clinic) Topical  Lidocaine 4% applied to wound bed Bathing/ Shower/ Hygiene: Other Bathing/Shower/Hygiene Orders/Instructions: - Change dressing after bathing Off-Loading: Other: - Try to not stand on your feet too much Hyperbaric Oxygen Therapy: Evaluate for HBO Therapy Indication: - wagner grade 3 diabetic foot ulcer right foot 2.0 ATA for 90 Minutes without Air Breaks T Number of Treatments: - 40 more treatments for a total of 80 09/30/21 otal One  treatments per day (delivered Monday through Friday unless otherwise specified in Special Instructions below): Finger stick Blood Glucose Pre- and Post- HBOT Treatment. Follow Hyperbaric Oxygen Glycemia Protocol Afrin (Oxymetazoline HCL) 0.05% nasal spray - 1 spray in both nostrils daily as needed prior to HBO treatment for difficulty clearing ears The following medication(s) was prescribed: lidocaine topical 4 % cream cream topical was prescribed at facility WOUND #2: - Foot Wound Laterality: Plantar, Right, Lateral Cleanser: Soap and Water Every Other Day/15 Days Discharge Instructions: May shower and wash wound with dial antibacterial soap and water prior to dressing change. Cleanser: Wound Cleanser (Generic) Every Other Day/15 Days Discharge Instructions: Cleanse the wound with wound cleanser prior to applying a clean dressing using gauze sponges, not tissue or cotton balls. Peri-Wound Care: cotton tipped applicators (Generic) Every Other Day/15 Days Topical: Gentamicin Every Other Day/15 Days Discharge Instructions: As directed by physician Prim Dressing: Iodoform packing strip 1/4 (in) Every Other Day/15 Days ary Discharge Instructions: Lightly pack as instructed Secondary Dressing: Woven Gauze Sponge, Non-Sterile 4x4 in Every Other Day/15 Days Discharge Instructions: Apply over primary dressing as directed. Secured With: Paper T ape, 2x10 (in/yd) (Generic) Every Other Day/15 Days Discharge Instructions: Secure dressing with tape as directed. Com  pression Wrap: Kerlix Roll 4.5x3.1 (in/yd) (Generic) Every Other Day/15 Days Discharge Instructions: Apply Kerlix and Coban compression as directed. 10/21/2021: There is still 1 portion of the wound, at the lateral aspect, that is still a couple of centimeters deep. The architecture of the wound makes this somewhat challenging to access and pack. Everything else looks clean. He has his usual accumulation of callus and skin, narrowing the orifice. I used a curette to debride callus and skin from the wound to maintain the orifice. We offered to see if home health will be available to help get the packing into this small but deep area. The patient indicated that his wife will be available to pack it to try this before engaging home health. We will continue using the narrow iodoform packing strips and gentamicin. He will continue his hyperbaric oxygen therapy. Follow-up in 1 week. Electronic Signature(s) Signed: 10/21/2021 7:58:40 AM By: [name] MD FACS Entered By: [name] on 10/21/2021 07:58:39 -------------------------------------------------------------------------------- HxROS Details Patient Name: Date of Service: [name], [name] 10/21/2021 7:45 A M Medical Record Number: [id number] Patient Account Number: [id number] Date of Birth/Sex: Treating RN: May 17, 1970 (51 y.o. M) Primary Care Provider: [name] Other Clinician: Referring Provider: Treating Provider/Extender: [name] in Treatment: 15 Information Obtained From Patient Endocrine Medical History: Positive for: Type II Diabetes Immunizations Pneumococcal Vaccine: Received Pneumococcal Vaccination: Yes Received Pneumococcal Vaccination On or After 60th Birthday: No Implantable Devices None Hospitalization / Surgery History Type of Hospitalization/Surgery Amuptation of 3rd,4th and 5th toes of Right foot;Oral Surgery;Anal Fissure surgery; Cholecystectomy Family and Social  History Never smoker; Marital Status - Married; Alcohol Use: Never; Drug Use: No History; Caffeine Use: Daily - T coffee; Financial Concerns: No; Food, ea; Clothing or Shelter Needs: No; Support System Lacking: No; Transportation Concerns: No Electronic Signature(s) Signed: 10/21/2021 9:17:55 AM By: [name] MD FACS Entered By: [name] on 10/21/2021 07:56:06 -------------------------------------------------------------------------------- SuperBill Details Patient Name: Date of Service: [name], [name] 10/21/2021 Medical Record Number: [id number] Patient Account Number: [id number] Date of Birth/Sex: Treating RN: [birth year] (51 y.o. M) Primary Care Provider: [name] Other Clinician: Referring Provider: Treating Provider/Extender: [name] in Treatment: 15 Diagnosis Coding ICD-10 Codes Code Description E11.49 Type  2 diabetes mellitus with other diabetic neurological complication Z02.585 Other chronic osteomyelitis, right ankle and foot L97.514 Non-pressure chronic ulcer of other part of right foot with necrosis of bone Facility Procedures CPT4 Code: [id number] Description: [phone] - DEBRIDE WOUND 1ST 20 SQ CM OR < ICD-10 Diagnosis Description L97.514 Non-pressure chronic ulcer of other part of right foot with necrosis of bone Modifier: Quantity: 1 Physician Procedures : CPT4 Code Description Modifier [id number] - WC PHYS LEVEL 4 - EST PT 25 ICD-10 Diagnosis Description L97.514 Non-pressure chronic ulcer of other part of right foot with necrosis of bone E11.49 Type 2 diabetes mellitus with other diabetic  neurological complication P50.932 Other chronic osteomyelitis, right ankle and foot Quantity: 1 : [id number] - WC PHYS DEBR WO ANESTH 20 SQ CM ICD-10 Diagnosis Description L97.514 Non-pressure chronic ulcer of other part of right foot with necrosis of bone Quantity: 1 Electronic Signature(s) Signed: 10/21/2021 7:58:57 AM By:  [name] MD FACS Signed: 10/21/2021 7:58:57 AM By: [name] MD FACS Entered By: [name] on 10/21/2021 07:58:57

## 2021-10-21 NOTE — Progress Notes (Addendum)
[name], [name] ([id number]) Visit Report for 10/21/2021 HBO Details Patient Name: Date of Service: [name], [place] [name] 10/21/2021 8:45 A M Medical Record Number: [id number] Patient Account Number: [id number] Date of Birth/Sex: Treating RN: Mar 27, 1970 (51 y.o. [name] Primary Care [name]: [name] Other Clinician: [name] Referring [name]: Treating Lorriane Dehart/Extender: [name] in Treatment: 15 HBO Treatment Course Details Treatment Course Number: 1 Ordering Antoinette Borgwardt: [name] T Treatments Ordered: otal 80 HBO Treatment Start Date: 08/14/2021 HBO Indication: Diabetic Ulcer(s) of the Lower Extremity Standard/Conservative Wound Care tried and failed greater than or equal to 30 days HBO Treatment Details Treatment Number: 48 Patient Type: Outpatient Chamber Type: Monoplace Chamber Serial #: [id number] Treatment Protocol: 2.0 ATA with 90 minutes oxygen, and no air breaks Treatment Details Compression Rate Down: 2.0 psi / minute De-Compression Rate Up: 2.0 psi / minute Air breaks and breathing Decompress Decompress Compress Tx Pressure Begins Reached periods Begins Ends (leave unused spaces blank) Chamber Pressure (ATA 1 2 ------2 1 ) Clock Time (24 hr) 08:19 08:28 - - - - - - 09:58 10:06 Treatment Length: 107 (minutes) Treatment Segments: 4 Vital Signs Capillary Blood Glucose Reference Range: 80 - 120 mg / dl HBO Diabetic Blood Glucose Intervention Range: <131 mg/dl or >249 mg/dl Time Vitals Blood Respiratory Capillary Blood Glucose Pulse Action Type: Pulse: Temperature: Taken: Pressure: Rate: Glucose (mg/dl): Meter #: Oximetry (%) Taken: Pre 08:12 135/84 97 16 98.6 177 Post 10:09 139/88 81 18 97.5 218 Treatment Response Treatment Toleration: Well Treatment Completion Status: Treatment Completed without Adverse Event Additional Procedure Documentation Tissue Sevierity: Necrosis of bone Physician HBO  Attestation: I certify that I supervised this HBO treatment in accordance with Medicare guidelines. A trained emergency response team is readily available per Yes hospital policies and procedures. Continue HBOT as ordered. Yes Electronic Signature(s) Signed: 10/21/2021 4:32:49 PM By: [name] DO Previous Signature: 10/21/2021 11:35:05 AM Version By: [name] EMT Previous Signature: 10/21/2021 1:44:59 PM Version By: [name] DO Entered By: [name] on 10/21/2021 16:31:27 -------------------------------------------------------------------------------- HBO Safety Checklist Details Patient Name: Date of Service: [name], [name] 10/21/2021 8:45 A M Medical Record Number: [id number] Patient Account Number: [id number] Date of Birth/Sex: Treating RN: Aug 22, 1970 (51 y.o. [name] Primary Care Nadirah Socorro: [name] Other Clinician: [name] Referring [name]: Treating Novalee Horsfall/Extender: [name] in Treatment: 15 HBO Safety Checklist Items Safety Checklist Consent Form Signed Patient voided / foley secured and emptied When did you last eato 0625 Last dose of injectable or oral agent 0630 Ostomy pouch emptied and vented if applicable NA All implantable devices assessed, documented and approved NA Intravenous access site secured and place NA Valuables secured Linens and cotton and cotton/polyester blend (less than 51% polyester) Personal oil-based products / skin lotions / body lotions removed Wigs or hairpieces removed NA Smoking or tobacco materials removed Books / newspapers / magazines / loose paper removed Cologne, aftershave, perfume and deodorant removed Jewelry removed (may wrap wedding band) NA Make-up removed NA Hair care products removed Battery operated devices (external) removed Heating patches and chemical warmers removed Titanium eyewear removed NA Nail polish cured greater than 10  hours NA Casting material cured greater than 10 hours NA Hearing aids removed NA Loose dentures or partials removed NA Prosthetics have been removed NA Patient demonstrates correct use of air break device (if applicable) Patient concerns have been addressed Patient grounding bracelet on and cord attached to chamber Specifics for Inpatients (complete  in addition to above) Medication sheet sent with patient NA Intravenous medications needed or due during therapy sent with patient NA Drainage tubes (e.g. nasogastric tube or chest tube secured and vented) NA Endotracheal or Tracheotomy tube secured NA Cuff deflated of air and inflated with saline NA Airway suctioned NA Notes The safety checklist was done before the treatment was started. Electronic Signature(s) Signed: 10/21/2021 11:32:53 AM By: [name] EMT Entered By: [name] on 10/21/2021 11:32:53

## 2021-10-21 NOTE — Progress Notes (Signed)
[name], [name] ([id number]) Visit Report for 10/21/2021 Problem List Details Patient Name: Date of Service: [name],  [name] 10/21/2021 8:45 A M Medical Record Number: [id number] Patient Account Number: [id number] Date of Birth/Sex: Treating RN: 01-May-1970 (51 y.o. [name] Primary Care Provider: [name] Other Clinician: [name] Referring Provider: Treating Provider/Extender: [name]5 Active Problems ICD-10 Encounter Code Description Active Date MDM Diagnosis E11.49 Type 2 diabetes mellitus with other diabetic neurological complication 10/13/2681 No Yes M86.671 Other chronic osteomyelitis, right ankle and foot 07/04/2021 No Yes L97.514 Non-pressure chronic ulcer of other part of right foot with necrosis of bone 07/04/2021 No Yes Inactive Problems Resolved Problems Electronic Signature(s) Signed: 10/21/2021 11:35:41 AM By: [name] EMT Signed: 10/21/2021 1:44:59 PM By: [name] DO Entered By: [name] on 10/21/2021 11:35:40 -------------------------------------------------------------------------------- SuperBill Details Patient Name: Date of Service: [name], [name] [name] 10/21/2021 Medical Record Number: [id number] Patient Account Number: [id number] Date of Birth/Sex: Treating RN: August 25, 1970 (51 y.o. [name] Primary Care Provider: [name] Other Clinician: [name] Referring Provider: Treating Provider/Extender: [name]5 Diagnosis Coding ICD-10 Codes Code Description E11.49 Type 2 diabetes mellitus with other diabetic neurological complication L89.211 Other chronic osteomyelitis, right ankle and foot L97.514 Non-pressure chronic ulcer of other part of right foot with necrosis of bone Facility Procedures CPT4 Code: 94174081 Description: G0277-(Facility Use Only) HBOT full body chamber, 74mn , ICD-10 Diagnosis  Description E11.49 Type 2 diabetes mellitus with other diabetic neurological complication MK48.185Other chronic osteomyelitis, right ankle and foot L97.514  Non-pressure chronic ulcer of other part of right foot with necrosis of bone Modifier: Quantity: 4 Physician Procedures : CPT4 Code Description Modifier [id number] 26378- WC PHYS HYPERBARIC OXYGEN THERAPY ICD-10 Diagnosis Description E11.49 Type 2 diabetes mellitus with other diabetic neurological complication MH88.502Other chronic osteomyelitis, right ankle and foot  L97.514 Non-pressure chronic ulcer of other part of right foot with necrosis of bone Quantity: 1 Electronic Signature(s) Signed: 10/21/2021 11:35:35 AM By: GValeria BatmanEMT Signed: 10/21/2021 1:44:59 PM By: HKalman ShanDO Entered By: GValeria Batmanon 10/21/2021 11:35:34

## 2021-10-22 ENCOUNTER — Encounter (HOSPITAL_BASED_OUTPATIENT_CLINIC_OR_DEPARTMENT_OTHER): Payer: BLUE CROSS/BLUE SHIELD | Admitting: Internal Medicine

## 2021-10-22 DIAGNOSIS — E1169 Type 2 diabetes mellitus with other specified complication: Secondary | ICD-10-CM | POA: Diagnosis not present

## 2021-10-22 DIAGNOSIS — E1149 Type 2 diabetes mellitus with other diabetic neurological complication: Secondary | ICD-10-CM

## 2021-10-22 DIAGNOSIS — M86671 Other chronic osteomyelitis, right ankle and foot: Secondary | ICD-10-CM | POA: Diagnosis not present

## 2021-10-22 DIAGNOSIS — L97514 Non-pressure chronic ulcer of other part of right foot with necrosis of bone: Secondary | ICD-10-CM

## 2021-10-22 LAB — GLUCOSE, CAPILLARY
Glucose-Capillary: 176 mg/dL — ABNORMAL HIGH (ref 70–99)
Glucose-Capillary: 183 mg/dL — ABNORMAL HIGH (ref 70–99)

## 2021-10-22 NOTE — Progress Notes (Signed)
[name], [name] ([id number]) Visit Report for 10/22/2021 HBO Safety Checklist Details Patient Name: Date of Service: [name], [place] [name] 10/22/2021 8:00 A M Medical Record Number: [id number] Patient Account Number: [id number] Date of Birth/Sex: Treating RN: 11-Oct-1970 (51 y.o. [name] Primary Care Cherolyn Behrle: [name] Other Clinician: [name] Referring Maceo Hernan: Treating Karmelo Bass/Extender: [name] in Treatment: 15 HBO Safety Checklist Items Safety Checklist Consent Form Signed Patient voided / foley secured and emptied When did you last eato 0525 Last dose of injectable or oral agent 0535 Ostomy pouch emptied and vented if applicable NA All implantable devices assessed, documented and approved NA Intravenous access site secured and place NA Valuables secured Linens and cotton and cotton/polyester blend (less than 51% polyester) Personal oil-based products / skin lotions / body lotions removed Wigs or hairpieces removed NA Smoking or tobacco materials removed Books / newspapers / magazines / loose paper removed Cologne, aftershave, perfume and deodorant removed Jewelry removed (may wrap wedding band) NA Make-up removed NA Hair care products removed Battery operated devices (external) removed Heating patches and chemical warmers removed Titanium eyewear removed NA Nail polish cured greater than 10 hours NA Casting material cured greater than 10 hours NA Hearing aids removed NA Loose dentures or partials removed NA Prosthetics have been removed NA Patient demonstrates correct use of air break device (if applicable) Patient concerns have been addressed Patient grounding bracelet on and cord attached to chamber Specifics for Inpatients (complete in addition to above) Medication sheet sent with patient NA Intravenous medications needed or due during therapy sent with patient NA Drainage tubes (e.g. nasogastric tube  or chest tube secured and vented) NA Endotracheal or Tracheotomy tube secured NA Cuff deflated of air and inflated with saline NA Airway suctioned NA Notes The safety checklist was done before the treatment was started. Electronic Signature(s) Signed: 10/22/2021 1:26:26 PM By: [name] EMT Entered By: [name] on 10/22/2021 13:26:25

## 2021-10-22 NOTE — Progress Notes (Addendum)
[name], [name] ([id number]) Visit Report for 10/22/2021 Arrival Information Details Patient Name: Date of Service: [name], [name] [place] [name] 10/22/2021 8:00 A M Medical Record Number: [id number] Patient Account Number: [id number] Date of Birth/Sex: Treating RN: 06-18-1970 [name], [name] Primary Care [name]: [name] Other Clinician: [name] Referring [name]: Treating [name] [name]5 Visit Information History Since Last Visit All ordered tests and consults were completed: Yes Patient Arrived: Ambulatory Added or deleted any medications: No Arrival Time: 07:48 Any new allergies or adverse reactions: No Accompanied By: None Had a fall or experienced change [name] No Transfer Assistance: None activities of daily living that may affect Patient Identification Verified: Yes risk of falls: Secondary Verification Process Completed: Yes Signs or symptoms of abuse/neglect since last visito No Patient Requires Transmission-Based Precautions: No Hospitalized since last visit: No Patient Has Alerts: Yes Implantable device outside of the clinic excluding No cellular tissue based products placed [name] the [name] since last visit: Pain Present Now: No Electronic Signature(s) Signed: 10/22/2021 1:15:57 PM By: [name] EMT Entered By: [name] on 10/22/2021 13:15:56 -------------------------------------------------------------------------------- Encounter Discharge Information Details Patient Name: Date of Service: [name], [name] 10/22/2021 8:00 A M Medical Record Number: [id number] Patient Account Number: [id number] Date of Birth/Sex: Treating RN: 1970-11-11 [name] Primary Care [name]: [name] Other Clinician: [name] Referring [name]: Treating Jahkai Yandell/Extender: [name]5 Encounter Discharge Information  Items Discharge Condition: Stable Ambulatory Status: Ambulatory Discharge Destination: Home Transportation: Private Auto Accompanied By: None Schedule Follow-up Appointment: Yes Clinical Summary of Care: Electronic Signature(s) Signed: 10/22/2021 1:29:22 PM By: [name] EMT Entered By: [name] on 10/22/2021 13:29:22 -------------------------------------------------------------------------------- Vitals Details Patient Name: Date of Service: [name], [name] 10/22/2021 8:00 A M Medical Record Number: [id number] Patient Account Number: [id number] Date of Birth/Sex: Treating RN: 15-Sep-1970 [name] Primary Care [name]: [name] Other Clinician: [name] Referring [name]: Treating [name]/Extender: [name]5 Vital Signs Time Taken: 07:50 Temperature (F): 98.6 Height ([name]: 72 Pulse (bpm): 99 Weight (lbs): 312 Respiratory Rate (breaths/min): 18 Body Mass Index (BMI): 42.3 Blood Pressure (mmHg): 155/79 Capillary Blood Glucose (mg/dl): 176 Reference Range: 80 - 120 mg / dl Electronic Signature(s) Signed: 10/22/2021 1:22:45 PM By: [name] EMT Entered By: [name] on 10/22/2021 13:22:45

## 2021-10-23 ENCOUNTER — Encounter (HOSPITAL_BASED_OUTPATIENT_CLINIC_OR_DEPARTMENT_OTHER): Payer: BLUE CROSS/BLUE SHIELD | Admitting: General Surgery

## 2021-10-23 DIAGNOSIS — E1169 Type 2 diabetes mellitus with other specified complication: Secondary | ICD-10-CM | POA: Diagnosis not present

## 2021-10-23 LAB — GLUCOSE, CAPILLARY
Glucose-Capillary: 148 mg/dL — ABNORMAL HIGH (ref 70–99)
Glucose-Capillary: 144 mg/dL — ABNORMAL HIGH (ref 70–99)

## 2021-10-23 NOTE — Progress Notes (Signed)
[name], Jaxxon M. ([id number]) Visit Report for 10/23/2021 Arrival Information Details Patient Name: Date of Service: Bell, Bovill [name] 10/23/2021 8:00 A M Medical Record Number: [id number] Patient Account Number: [id number] Date of Birth/Sex: Treating RN: 01-Apr-1970 (51 y.o. [name], [name] Primary Care Ascencion Coye: [name] Other Clinician: [name] Referring Rhonna Holster: Treating Parthena Fergeson/Extender: [name] in Treatment: 15 Visit Information History Since Last Visit All ordered tests and consults were completed: Yes Patient Arrived: Ambulatory Added or deleted any medications: No Arrival Time: 07:53 Any new allergies or adverse reactions: No Accompanied By: None Had a fall or experienced change in No Transfer Assistance: None activities of daily living that may affect Patient Identification Verified: Yes risk of falls: Secondary Verification Process Completed: Yes Signs or symptoms of abuse/neglect since last visito No Patient Requires Transmission-Based Precautions: No Hospitalized since last visit: No Patient Has Alerts: Yes Implantable device outside of the clinic excluding No cellular tissue based products placed in the center since last visit: Pain Present Now: No Electronic Signature(s) Signed: 10/23/2021 3:13:28 PM By: [name] EMT Entered By: [name] on 10/23/2021 15:13:27 -------------------------------------------------------------------------------- Encounter Discharge Information Details Patient Name: Date of Service: [place], [name] 10/23/2021 8:00 A M Medical Record Number: [id number] Patient Account Number: [id number] Date of Birth/Sex: Treating RN: 1970-01-24 (51 y.o. [name] Primary Care Myrical Andujo: [name] Other Clinician: [name] Referring Rakel Junio: Treating Derica Leiber/Extender: [name] in Treatment: 15 Encounter Discharge Information  Items Discharge Condition: Stable Ambulatory Status: Ambulatory Discharge Destination: Home Transportation: Private Auto Accompanied By: None Schedule Follow-up Appointment: Yes Clinical Summary of Care: Electronic Signature(s) Signed: 10/23/2021 3:16:56 PM By: [name] EMT Entered By: [name] on 10/23/2021 15:16:56 -------------------------------------------------------------------------------- Vitals Details Patient Name: Date of Service: [name], [name] 10/23/2021 8:00 A M Medical Record Number: [id number] Patient Account Number: [id number] Date of Birth/Sex: Treating RN: 11-Apr-1970 (51 y.o. [name], [name] Primary Care Riya Huxford: [name] Other Clinician: [name] Referring Richerd Grime: Treating Elois Averitt/Extender: [name] in Treatment: 15 Vital Signs Time Taken: 08:12 Temperature (F): 98.6 Height (in): 72 Pulse (bpm): 94 Weight (lbs): 312 Respiratory Rate (breaths/min): 16 Body Mass Index (BMI): 42.3 Blood Pressure (mmHg): 127/78 Capillary Blood Glucose (mg/dl): 148 Reference Range: 80 - 120 mg / dl Electronic Signature(s) Signed: 10/23/2021 3:13:55 PM By: [name] EMT Entered By: [name] on 10/23/2021 15:13:54

## 2021-10-23 NOTE — Progress Notes (Signed)
[name], [name] ([id number]) Visit Report for 10/22/2021 Problem List Details Patient Name: Date of Service: [name], [name] 10/22/2021 8:00 A M Medical Record Number: [id number] Patient Account Number: [id number] Date of Birth/Sex: Treating RN: [birth year] (51 y.o. 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 10/22/2021 Medical Record Number: [id number] Patient Account Number: [id number] Date of Birth/Sex: Treating RN: 12/19/1970 (51 y.o. 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 MA25.053Other chronic osteomyelitis, right ankle and foot L97.514  Non-pressure chronic ulcer of other part of right foot with necrosis of bone Modifier: Quantity: 4 Physician Procedures : CPT4 Code Description Modifier [id number]- WC PHYS HYPERBARIC OXYGEN THERAPY ICD-10 Diagnosis Description 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

## 2021-10-23 NOTE — Progress Notes (Addendum)
[name], [name] ([id number]) Visit Report for 10/23/2021 HBO Details Patient Name: Date of Service: [name], [name] 10/23/2021 8:00 A M Medical Record Number: [id number] Patient Account Number: [id number] Date of Birth/Sex: Treating RN: 12/01/1970 [name], [name] Primary Care Binh Doten: [name] Other Clinician: [name] Referring Keera Altidor: Treating Shameeka Silliman/Extender: [name] in Treatment: 15 HBO Treatment Course Details Treatment Course Number: 1 Ordering Charrisse Masley: [name] T Treatments Ordered: otal 80 HBO Treatment Start Date: 08/14/2021 HBO Indication: Diabetic Ulcer(s) of the Lower Extremity Standard/Conservative Wound Care tried and failed greater than or equal to 30 days HBO Treatment Details Treatment Number: 50 Patient Type: Outpatient Chamber Type: Monoplace Chamber Serial #: [id number] Treatment Protocol: 2.0 ATA with 90 minutes oxygen, and no air breaks Treatment Details Compression Rate Down: 2.0 psi / minute De-Compression Rate Up: 2.0 psi / minute Air breaks and breathing Decompress Decompress Compress Tx Pressure Begins Reached periods Begins Ends (leave unused spaces blank) Chamber Pressure (ATA 1 2 ------2 1 ) Clock Time (24 hr) 08:16 08:24 - - - - - - 09:54 10:02 Treatment Length: 106 (minutes) Treatment Segments: 4 Vital Signs Capillary Blood Glucose Reference Range: 80 - 120 mg / dl HBO Diabetic Blood Glucose Intervention Range: <131 mg/dl or >249 mg/dl Time Vitals Blood Respiratory Capillary Blood Glucose Pulse Action Type: Pulse: Temperature: Taken: Pressure: Rate: Glucose (mg/dl): Meter #: Oximetry (%) Taken: Pre 08:12 127/78 94 16 98.6 148 Post 10:08 128/81 82 18 98.1 144 Treatment Response Treatment Toleration: Well Treatment Completion Status: Treatment Completed without Adverse Event Additional Procedure Documentation Tissue Sevierity: Necrosis of bone Physician HBO  Attestation: I certify that I supervised this HBO treatment in accordance with Medicare guidelines. A trained emergency response team is readily available per Yes hospital policies and procedures. Continue HBOT as ordered. Yes Electronic Signature(s) Signed: 10/23/2021 4:58:17 PM By: [name] MD FACS Previous Signature: 10/23/2021 3:16:08 PM Version By: [name] EMT Entered By: [name] on 10/23/2021 16:58:17 -------------------------------------------------------------------------------- HBO Safety Checklist Details Patient Name: Date of Service: [name], [name] 10/23/2021 8:00 A M Medical Record Number: [id number] Patient Account Number: [id number] Date of Birth/Sex: Treating RN: 1970/07/06 [name], [name] Primary Care [name]: [name] Other Clinician: [name] Referring [name]: Treating [name]/Extender: [name] in Treatment: 15 HBO Safety Checklist Items Safety Checklist Consent Form Signed Patient voided / foley secured and emptied When did you last eato 0700 Last dose of injectable or oral agent 0705 Ostomy pouch emptied and vented if applicable NA All implantable devices assessed, documented and approved NA Intravenous access site secured and place NA Valuables secured Linens and cotton and cotton/polyester blend (less than 51% polyester) Personal oil-based products / skin lotions / body lotions removed Wigs or hairpieces removed NA Smoking or tobacco materials removed Books / newspapers / magazines / loose paper removed Cologne, aftershave, perfume and deodorant removed Jewelry removed (may wrap wedding band) NA Make-up removed NA Hair care products removed Battery operated devices (external) removed Heating patches and chemical warmers removed Titanium eyewear removed NA Nail polish cured greater than 10 hours NA Casting material cured greater than 10 hours NA Hearing aids  removed NA Loose dentures or partials removed NA Prosthetics have been removed NA Patient demonstrates correct use of air break device (if applicable) Patient concerns have been addressed Patient grounding bracelet on and cord attached to chamber Specifics for Inpatients (complete in addition to above) Medication sheet sent with patient  NA Intravenous medications needed or due during therapy sent with patient NA Drainage tubes (e.g. nasogastric tube or chest tube secured and vented) NA Endotracheal or Tracheotomy tube secured NA Cuff deflated of air and inflated with saline NA Airway suctioned NA Notes The safety checklist was done before the treatment was started. Electronic Signature(s) Signed: 10/23/2021 3:14:46 PM By: [name] EMT Entered By: [name] on 10/23/2021 15:14:46

## 2021-10-23 NOTE — Progress Notes (Signed)
[name], Dock M. ([id number]) Visit Report for 10/23/2021 Problem List Details Patient Name: Date of Service: [name], [name] 10/23/2021 8:00 A M Medical Record Number: [id number] Patient Account Number: [id number] Date of Birth/Sex: Treating RN: January 31, 1970 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 10/23/2021 Medical Record Number: [id number] Patient Account Number: [id number] Date of Birth/Sex: Treating RN: [birth year] 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 MQ75.916Other chronic osteomyelitis, right ankle and foot L97.514  Non-pressure chronic ulcer of other part of right foot with necrosis of bone Modifier: Quantity: 4 Physician Procedures : CPT4 Code Description Modifier [id number] 93570- WC PHYS HYPERBARIC OXYGEN THERAPY ICD-10 Diagnosis Description 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

## 2021-10-24 ENCOUNTER — Encounter (HOSPITAL_BASED_OUTPATIENT_CLINIC_OR_DEPARTMENT_OTHER): Payer: BLUE CROSS/BLUE SHIELD | Admitting: General Surgery

## 2021-10-24 DIAGNOSIS — E1169 Type 2 diabetes mellitus with other specified complication: Secondary | ICD-10-CM | POA: Diagnosis not present

## 2021-10-24 LAB — GLUCOSE, CAPILLARY
Glucose-Capillary: 168 mg/dL — ABNORMAL HIGH (ref 70–99)
Glucose-Capillary: 172 mg/dL — ABNORMAL HIGH (ref 70–99)

## 2021-10-24 NOTE — Progress Notes (Signed)
[name] [name], [name] ([id number]) Visit Report for 10/24/2021 Arrival Information Details Patient Name: Date of Service: [name], [name] 10/24/2021 8:00 A M Medical Record Number: [id number] Patient Account Number: [id number] Date of Birth/Sex: Treating RN: 1970/12/06 (51 y.o. [name] [name], [place] Primary Care Zaya Kessenich: [name] Other Clinician: [name] Referring Kamil Hanigan: Treating Jess Sulak/Extender: [name] [name]: 39 Visit Information History Since Last Visit All ordered tests and consults were completed: Yes Patient Arrived: Ambulatory Added or deleted any medications: No Arrival Time: 07:53 Any new allergies or adverse reactions: No Accompanied By: None Had a fall or experienced change [name] No Transfer Assistance: None activities of daily living that may affect Patient Identification Verified: Yes risk of falls: Secondary Verification Process Completed: Yes Signs or symptoms of abuse/neglect since last visito No Patient Requires Transmission-Based Precautions: No Hospitalized since last visit: No Patient Has Alerts: Yes Implantable device outside of the clinic excluding No cellular tissue based products placed [name] the center since last visit: Pain Present Now: No Electronic Signature(s) Signed: 10/24/2021 4:18:35 PM By: [name] EMT Entered By: [name] on 10/24/2021 16:18:34 -------------------------------------------------------------------------------- Encounter Discharge Information Details Patient Name: Date of Service: [name], [name] 10/24/2021 8:00 A M Medical Record Number: [id number] Patient Account Number: [id number] Date of Birth/Sex: Treating RN: 1970-03-07 (51 y.o. [name] [name] Primary Care Kerney Hopfensperger: [name] Other Clinician: [name] Referring Paydon Carll: Treating Sharlene Mccluskey/Extender: [name] [name]6 Encounter Discharge Information  Items Discharge Condition: Stable Ambulatory Status: Ambulatory Discharge Destination: Home Transportation: Private Auto Accompanied By: None Schedule Follow-up Appointment: Yes Clinical Summary of Care: Electronic Signature(s) Signed: 10/24/2021 4:24:11 PM By: [name] EMT Entered By: [name] on 10/24/2021 16:24:10 -------------------------------------------------------------------------------- Vitals Details Patient Name: Date of Service: [name] [name], [name] 10/24/2021 8:00 A M Medical Record Number: [id number] Patient Account Number: [id number] Date of Birth/Sex: Treating RN: 20-Dec-1970 (51 y.o. [name] [name] Primary Care Jennette Leask: [name] Other Clinician: [name] Referring Nishan Ovens: Treating Brodie Correll/Extender: [name] [name]6 Vital Signs Time Taken: 08:07 Temperature (F): 98.4 Height ([name]: 72 Pulse (bpm): 99 Weight (lbs): 312 Respiratory Rate (breaths/min): 18 Body Mass Index (BMI): 42.3 Blood Pressure (mmHg): 112/73 Reference Range: 80 - 120 mg / dl Electronic Signature(s) Signed: 10/24/2021 4:18:57 PM By: [name] EMT Entered By: [name] on 10/24/2021 16:18:57

## 2021-10-24 NOTE — Progress Notes (Addendum)
[name], [name] ([id number]) Visit Report for 10/24/2021 HBO Details Patient Name: Date of Service: [name], [name] 10/24/2021 8:00 A M Medical Record Number: [id number] Patient Account Number: [id number] Date of Birth/Sex: Treating RN: May 23, 1970 (51 y.o. [name] Nirav Sweda: [name] Other Clinician: [name] Referring Kyrus Hyde: Treating Tobey Schmelzle/Extender: [name] in Treatment: 16 HBO Treatment Course Details Treatment Course Number: 1 Ordering Monti Jilek: [name] T Treatments Ordered: otal 80 HBO Treatment Start Date: 08/14/2021 HBO Indication: Diabetic Ulcer(s) of the Lower Extremity Standard/Conservative Wound Care tried and failed greater than or equal to 30 days HBO Treatment Details Treatment Number: 51 Patient Type: Outpatient Chamber Type: Monoplace Chamber Serial #: [id number] Treatment Protocol: 2.0 ATA with 90 minutes oxygen, and no air breaks Treatment Details Compression Rate Down: 2.0 psi / minute De-Compression Rate Up: 2.0 psi / minute Air breaks and breathing Decompress Decompress Compress Tx Pressure Begins Reached periods Begins Ends (leave unused spaces blank) Chamber Pressure (ATA 1 2 ------2 1 ) Clock Time (24 hr) 08:18 08:26 - - - - - - 09:56 10:04 Treatment Length: 106 (minutes) Treatment Segments: 4 Vital Signs Capillary Blood Glucose Reference Range: 80 - 120 mg / dl HBO Diabetic Blood Glucose Intervention Range: <131 mg/dl or >249 mg/dl Time Vitals Blood Respiratory Capillary Blood Glucose Pulse Action Type: Pulse: Temperature: Taken: Pressure: Rate: Glucose (mg/dl): Meter #: Oximetry (%) Taken: Pre 08:07 112/73 99 18 98.4 168 Post 10:08 132/83 85 16 98.2 172 Treatment Response Treatment Toleration: Well Treatment Completion Status: Treatment Completed without Adverse Event Additional Procedure Documentation Tissue Sevierity: Necrosis of bone Physician HBO  Attestation: I certify that I supervised this HBO treatment in accordance with Medicare guidelines. A trained emergency response team is readily available per Yes hospital policies and procedures. Continue HBOT as ordered. Yes Electronic Signature(s) Signed: 10/25/2021 8:15:27 AM By: [name] MD FACS Previous Signature: 10/24/2021 4:23:17 PM Version By: [name] EMT Entered By: [name] on 10/25/2021 08:15:27 -------------------------------------------------------------------------------- HBO Safety Checklist Details Patient Name: Date of Service: [name], [name] 10/24/2021 8:00 A M Medical Record Number: [id number] Patient Account Number: [id number] Date of Birth/Sex: Treating RN: 09-20-1970 (51 y.o. [name] [name]: [name] Other Clinician: [name] Referring Myson Levi: Treating Samentha Perham/Extender: [name] in Treatment: 16 HBO Safety Checklist Items Safety Checklist Consent Form Signed Patient voided / foley secured and emptied When did you last eato 0555 Last dose of injectable or oral agent 0600 Ostomy pouch emptied and vented if applicable NA All implantable devices assessed, documented and approved NA Intravenous access site secured and place NA Valuables secured Linens and cotton and cotton/polyester blend (less than 51% polyester) Personal oil-based products / skin lotions / body lotions removed Wigs or hairpieces removed NA Smoking or tobacco materials removed Books / newspapers / magazines / loose paper removed Cologne, aftershave, perfume and deodorant removed Jewelry removed (may wrap wedding band) NA Make-up removed NA Hair care products removed Battery operated devices (external) removed Heating patches and chemical warmers removed Titanium eyewear removed NA Nail polish cured greater than 10 hours NA Casting material cured greater than 10 hours NA Hearing aids  removed NA Loose dentures or partials removed NA Prosthetics have been removed NA Patient demonstrates correct use of air break device (if applicable) Patient concerns have been addressed Patient grounding bracelet on and cord attached to chamber Specifics for Inpatients (complete in addition to above) Medication sheet sent with patient  NA Intravenous medications needed or due during therapy sent with patient NA Drainage tubes (e.g. nasogastric tube or chest tube secured and vented) NA Endotracheal or Tracheotomy tube secured NA Cuff deflated of air and inflated with saline NA Airway suctioned NA Notes The safety checklist was done before the treatment was started. Electronic Signature(s) Signed: 10/24/2021 4:22:11 PM By: [name] EMT Entered By: [name] on 10/24/2021 16:22:11

## 2021-10-25 ENCOUNTER — Encounter (HOSPITAL_BASED_OUTPATIENT_CLINIC_OR_DEPARTMENT_OTHER): Payer: BLUE CROSS/BLUE SHIELD | Admitting: Internal Medicine

## 2021-10-25 ENCOUNTER — Other Ambulatory Visit: Payer: Self-pay | Admitting: Family Medicine

## 2021-10-25 DIAGNOSIS — E1149 Type 2 diabetes mellitus with other diabetic neurological complication: Secondary | ICD-10-CM

## 2021-10-25 DIAGNOSIS — E1169 Type 2 diabetes mellitus with other specified complication: Secondary | ICD-10-CM | POA: Diagnosis not present

## 2021-10-25 DIAGNOSIS — L97514 Non-pressure chronic ulcer of other part of right foot with necrosis of bone: Secondary | ICD-10-CM

## 2021-10-25 DIAGNOSIS — M86671 Other chronic osteomyelitis, right ankle and foot: Secondary | ICD-10-CM

## 2021-10-25 LAB — GLUCOSE, CAPILLARY
Glucose-Capillary: 140 mg/dL — ABNORMAL HIGH (ref 70–99)
Glucose-Capillary: 131 mg/dL — ABNORMAL HIGH (ref 70–99)

## 2021-10-25 NOTE — Progress Notes (Addendum)
[name], [place] ([id number]) [id number] Page 1 of 2 Visit Report for 10/25/2021 HBO Details Patient Name: Date of Service: [name], [name] 10/25/2021 8:00 [name] Medical Record Number: [id number] Patient Account Number: [id number] Date of Birth/Sex: Treating RN: 1970/09/07 [name], [name] Primary Care Dulcinea Kinser: [name] Other Clinician: [name] Referring [name]: Treating [name]/Extender: [name] in Treatment: 16 HBO Treatment Course Details Treatment Course Number: 1 Ordering Tianni Escamilla: [name] T Treatments Ordered: otal 80 HBO Treatment Start Date: 08/14/2021 HBO Indication: Diabetic Ulcer(s) of the Lower Extremity Standard/Conservative Wound Care tried and failed greater than or equal to 30 days HBO Treatment Details Treatment Number: 52 Patient Type: Outpatient Chamber Type: Monoplace Chamber Serial #: [id number] Treatment Protocol: 2.0 ATA with 90 minutes oxygen, and no air breaks Treatment Details Compression Rate Down: 2.0 psi / minute De-Compression Rate Up: 2.0 psi / minute Air breaks and breathing Decompress Decompress Compress Tx Pressure Begins Reached periods Begins Ends (leave unused spaces blank) Chamber Pressure (ATA 1 2 ------2 1 ) Clock Time (24 hr) 08:07 08:20 - - - - - - 09:50 10:04 Treatment Length: 117 (minutes) Treatment Segments: 4 Vital Signs Capillary Blood Glucose Reference Range: 80 - 120 mg / dl HBO Diabetic Blood Glucose Intervention Range: <131 mg/dl or >249 mg/dl Time Vitals Blood Respiratory Capillary Blood Glucose Pulse Action Type: Pulse: Temperature: Taken: Pressure: Rate: Glucose (mg/dl): Meter #: Oximetry (%) Taken: Pre 07:59 126/71 98 18 98.8 140 Post 10:09 140/85 79 18 97.4 131 Treatment Response Treatment Toleration: Well Treatment Completion Status: Treatment Completed without Adverse Event Additional Procedure Documentation Tissue  Sevierity: Necrosis of bone Physician HBO Attestation: I certify that I supervised this HBO treatment in accordance with Medicare guidelines. A trained emergency response team is readily available per Yes hospital policies and procedures. Continue HBOT as ordered. Yes Electronic Signature(s) Signed: 10/28/2021 12:26:27 PM By: [name] DO Previous Signature: 10/25/2021 2:01:29 PM Version By: [name] EMT Entered By: [name] on 10/28/2021 09:11:49 [name], [name] ([id number]) [id number] Page 2 of 2 -------------------------------------------------------------------------------- HBO Safety Checklist Details Patient Name: Date of Service: [name], [name] 10/25/2021 8:00 [name] Medical Record Number: [id number] Patient Account Number: [id number] Date of Birth/Sex: Treating RN: Dec 14, 1970 [name], [name] Primary Care [name]: [name] Other Clinician: [name] Referring [name]: Treating [name]/Extender: [name] in Treatment: 16 HBO Safety Checklist Items Safety Checklist Consent Form Signed Patient voided / foley secured and emptied When did you last eato 0655 Last dose of injectable or oral agent 0650 Ostomy pouch emptied and vented if applicable NA All implantable devices assessed, documented and approved NA Intravenous access site secured and place NA Valuables secured Linens and cotton and cotton/polyester blend (less than 51% polyester) Personal oil-based products / skin lotions / body lotions removed Wigs or hairpieces removed NA Smoking or tobacco materials removed Books / newspapers / magazines / loose paper removed Cologne, aftershave, perfume and deodorant removed Jewelry removed (may wrap wedding band) NA Make-up removed NA Hair care products removed Battery operated devices (external) removed Heating patches and chemical warmers removed Titanium eyewear  removed NA Nail polish cured greater than 10 hours NA Casting material cured greater than 10 hours NA Hearing aids removed NA Loose dentures or partials removed NA Prosthetics have been removed NA Patient demonstrates correct use of air break device (if applicable) Patient concerns have been addressed Patient grounding bracelet on and cord attached to chamber  Specifics for Inpatients (complete in addition to above) Medication sheet sent with patient NA Intravenous medications needed or due during therapy sent with patient NA Drainage tubes (e.g. nasogastric tube or chest tube secured and vented) NA Endotracheal or Tracheotomy tube secured NA Cuff deflated of air and inflated with saline NA Airway suctioned NA Notes The safety checklist was done before the treatment was started. Electronic Signature(s) Signed: 10/25/2021 2:00:24 PM By: [name] EMT Entered By: [name] on 10/25/2021 14:00:24

## 2021-10-25 NOTE — Progress Notes (Signed)
[name], [name] ([id number]) Visit Report for 10/24/2021 Problem List Details Patient Name: Date of Service: [name], [name] 10/24/2021 8:00 A M Medical Record Number: [id number] Patient Account Number: [id number] Date of Birth/Sex: Treating RN: [birth year] (51 y.o. 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 10/24/2021 Medical Record Number: [id number] Patient Account Number: [id number] Date of Birth/Sex: Treating RN: Aug 09, 1970 (51 y.o. 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 LW10.272Non-pressure chronic ulcer of other part of right foot with necrosis  of bone M86.671 Other chronic osteomyelitis, right ankle and foot Modifier: Quantity: 4 Physician Procedures : CPT4 Code Description Modifier [id number]- WC PHYS HYPERBARIC OXYGEN THERAPY ICD-10 Diagnosis Description 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

## 2021-10-25 NOTE — Progress Notes (Signed)
[name], [name] ([id number]) Visit Report for 10/25/2021 Arrival Information Details Patient Name: Date of Service: [name], [name] 10/25/2021 8:00 A M Medical Record Number: [id number] Patient Account Number: [id number] Date of Birth/Sex: Treating RN: 09-15-1970 [name], [name] Primary Care Deniz Hannan: [name] Other Clinician: [name] Referring Brittnae Aschenbrenner: Treating Mehul Rudin/Extender: [name]: 40 Visit Information History Since Last Visit All ordered tests and consults were completed: Yes Patient Arrived: Ambulatory Added or deleted any medications: No Arrival Time: 08:45 Any new allergies or adverse reactions: No Accompanied By: None Had a fall or experienced change [name] No Transfer Assistance: None activities of daily living that may affect Patient Identification Verified: Yes risk of falls: Secondary Verification Process Completed: Yes Signs or symptoms of abuse/neglect since last visito No Patient Requires Transmission-Based Precautions: No Hospitalized since last visit: No Patient Has Alerts: Yes Implantable device outside of the clinic excluding No cellular tissue based products placed [name] the center since last visit: Pain Present Now: No Electronic Signature(s) Signed: 10/25/2021 1:58:53 PM By: [name] EMT Entered By: [name] on 10/25/2021 13:58:53 -------------------------------------------------------------------------------- Encounter Discharge Information Details Patient Name: Date of Service: [name], [name] 10/25/2021 8:00 A M Medical Record Number: [id number] Patient Account Number: [id number] Date of Birth/Sex: Treating RN: [birth year] (51 y.o. [name] Primary Care Aidian Salomon: [name] Other Clinician: [name] Referring Keiton Cosma: Treating Erasmus Bistline/Extender: [name]6 Encounter Discharge Information  Items Discharge Condition: Stable Ambulatory Status: Ambulatory Discharge Destination: Home Transportation: Private Auto Accompanied By: None Schedule Follow-up Appointment: Yes Clinical Summary of Care: Electronic Signature(s) Signed: 10/25/2021 2:02:55 PM By: [name] EMT Entered By: [name] on 10/25/2021 14:02:55 -------------------------------------------------------------------------------- Vitals Details Patient Name: Date of Service: [name], [name] 10/25/2021 8:00 A M Medical Record Number: [id number] Patient Account Number: [id number] Date of Birth/Sex: Treating RN: 05/31/1970 [name], [name] Primary Care Takako Minckler: [name] Other Clinician: [name] Referring Tysean Vandervliet: Treating Jareli Highland/Extender: [name]6 Vital Signs Time Taken: 07:59 Temperature (F): 98.8 Height ([name]: 72 Pulse (bpm): 98 Weight (lbs): 312 Respiratory Rate (breaths/min): 18 Body Mass Index (BMI): 42.3 Blood Pressure (mmHg): 126/71 Capillary Blood Glucose (mg/dl): 140 Reference Range: 80 - 120 mg / dl Electronic Signature(s) Signed: 10/25/2021 1:59:19 PM By: [name] EMT Entered By: [name] on 10/25/2021 13:59:19

## 2021-10-28 ENCOUNTER — Encounter (HOSPITAL_BASED_OUTPATIENT_CLINIC_OR_DEPARTMENT_OTHER): Payer: BLUE CROSS/BLUE SHIELD | Admitting: Internal Medicine

## 2021-10-28 DIAGNOSIS — L97514 Non-pressure chronic ulcer of other part of right foot with necrosis of bone: Secondary | ICD-10-CM | POA: Diagnosis not present

## 2021-10-28 DIAGNOSIS — E1149 Type 2 diabetes mellitus with other diabetic neurological complication: Secondary | ICD-10-CM

## 2021-10-28 DIAGNOSIS — E1169 Type 2 diabetes mellitus with other specified complication: Secondary | ICD-10-CM | POA: Diagnosis not present

## 2021-10-28 DIAGNOSIS — M86671 Other chronic osteomyelitis, right ankle and foot: Secondary | ICD-10-CM | POA: Diagnosis not present

## 2021-10-28 LAB — GLUCOSE, CAPILLARY
Glucose-Capillary: 153 mg/dL — ABNORMAL HIGH (ref 70–99)
Glucose-Capillary: 133 mg/dL — ABNORMAL HIGH (ref 70–99)

## 2021-10-28 NOTE — Progress Notes (Signed)
[name], [name] ([id number]) [id number] Page 1 of 2 Visit Report for 10/25/2021 Problem List Details Patient Name: Date of Service: [name], [name] 10/25/2021 8:00 [name] Medical Record Number: [id number] Patient Account Number: [id number] Date of Birth/Sex: Treating RN: Oct 07, 1970 (51 y.o. [name] Primary Care Provider: [name] Other Clinician: [name] Referring Provider: Treating Provider/Extender: [name] in Treatment: 16 Active Problems ICD-10 Encounter Code Description Active Date MDM Diagnosis E11.49 Type 2 diabetes mellitus with other diabetic neurological 9/48/0165 No Yes complication V37.482 Other chronic osteomyelitis, right ankle and foot 07/04/2021 No Yes L97.514 Non-pressure chronic ulcer of other part of right foot with 07/04/2021 No Yes necrosis of bone Inactive Problems Resolved Problems Electronic Signature(s) Signed: 10/25/2021 2:02:23 PM By: [name] EMT Signed: 10/28/2021 12:26:27 PM By: [name] DO Entered By: [name] on 10/25/2021 14:02:23 -------------------------------------------------------------------------------- SuperBill Details Patient Name: Date of Service: [name], [name] 10/25/2021 Medical Record Number: [id number] Patient Account Number: [id number] Date of Birth/Sex: Treating RN: [birth year] (51 y.o. 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 B20.100 Other chronic osteomyelitis, right ankle and foot L97.514 Non-pressure chronic ulcer of other part of right foot with necrosis of bone [name], [name] ([id number]) [id number] Page 2 of 2 Facility  Procedures : CPT4 Code Description: [id number] G0277-(Facility Use Only) HBOT full body chamber, 74mn , ICD-10 Diagnosis Description E11.49 Type 2 diabetes mellitus with other diabetic neurolog L97.514 Non-pressure chronic ulcer of other part of right foo M86.671  Other chronic osteomyelitis, right ankle and foot Modifier: ical complication t with necrosis o Quantity: 4 f bone Physician Procedures : CPT4 Code Description Modifier [id number] 40768- WC PHYS HYPERBARIC OXYGEN THERAPY ICD-10 Diagnosis Description 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

## 2021-10-28 NOTE — Progress Notes (Signed)
[name], [name] ([id number]) [id number] Page 1 of 2 Visit Report for 10/28/2021 HBO Details Patient Name: Date of Service: [name], [name] 10/28/2021 8:00 A M Medical Record Number: [id number] Patient Account Number: [id number] Date of Birth/Sex: Treating RN: 1970/09/14 [name], [name] Ellia Knowlton: [name] Other Clinician: [name] Referring Armetta Henri: Treating Trei Schoch/Extender: [name] in Treatment: 16 HBO Treatment Course Details Treatment Course Number: 1 Ordering Morna Flud: [name] T Treatments Ordered: otal 80 HBO Treatment Start Date: 08/14/2021 HBO Indication: Diabetic Ulcer(s) of the Lower Extremity Standard/Conservative Wound [name] tried and failed greater than or equal to 30 days HBO Treatment Details Treatment Number: 53 Patient Type: Outpatient Chamber Type: Monoplace Chamber Serial #: [id number] Treatment Protocol: 2.0 ATA with 90 minutes oxygen, and no air breaks Treatment Details Compression Rate Down: 2.0 psi / minute De-Compression Rate Up: 2.0 psi / minute Air breaks and breathing Decompress Decompress Compress Tx Pressure Begins Reached periods Begins Ends (leave unused spaces blank) Chamber Pressure (ATA 1 2 ------2 1 ) Clock Time (24 hr) 08:12 08:19 - - - - - - 09:50 09:58 Treatment Length: 106 (minutes) Treatment Segments: 4 Vital Signs Capillary Blood Glucose Reference Range: 80 - 120 mg / dl HBO Diabetic Blood Glucose Intervention Range: <131 mg/dl or >249 mg/dl Time Vitals Blood Respiratory Capillary Blood Glucose Pulse Action Type: Pulse: Temperature: Taken: Pressure: Rate: Glucose (mg/dl): Meter #: Oximetry (%) Taken: Pre 08:05 138/77 99 16 98.4 153 Post 10:02 135/87 91 18 97.9 133 Treatment Response Treatment Toleration: Well Treatment Completion Status: Treatment Completed without Adverse Event Additional Procedure Documentation Tissue  Sevierity: Necrosis of bone Physician HBO Attestation: I certify that I supervised this HBO treatment in accordance with Medicare guidelines. A trained emergency response team is readily available per Yes hospital policies and procedures. Continue HBOT as ordered. Yes Electronic Signature(s) Signed: 10/29/2021 10:03:43 AM By: [name] DO Previous Signature: 10/28/2021 11:40:07 AM Version By: [name] EMT Entered By: [name] on 10/28/2021 15:08:37 [name], [name] ([id number]) [id number] Page 2 of 2 -------------------------------------------------------------------------------- HBO Safety Checklist Details Patient Name: Date of Service: [name], [name] 10/28/2021 8:00 A M Medical Record Number: [id number] Patient Account Number: [id number] Date of Birth/Sex: Treating RN: Mar 05, 1970 [name], [name] Bronte [name]: [name] Other Clinician: [name] Referring [name]: Treating [name]/Extender: [name] in Treatment: 16 HBO Safety Checklist Items Safety Checklist Consent Form Signed Patient voided / foley secured and emptied When did you last eato 0700 Last dose of injectable or oral agent 0710 Ostomy pouch emptied and vented if applicable NA All implantable devices assessed, documented and approved NA Intravenous access site secured and place NA Valuables secured Linens and cotton and cotton/polyester blend (less than 51% polyester) Personal oil-based products / skin lotions / body lotions removed Wigs or hairpieces removed NA Smoking or tobacco materials removed Books / newspapers / magazines / loose paper removed Cologne, aftershave, perfume and deodorant removed Jewelry removed (may wrap wedding band) NA Make-up removed NA Hair [name] products removed Battery operated devices (external) removed Heating patches and chemical warmers removed Titanium eyewear  removed NA Nail polish cured greater than 10 hours NA Casting material cured greater than 10 hours NA Hearing aids removed NA Loose dentures or partials removed NA Prosthetics have been removed NA Patient demonstrates correct use of air break device (if applicable) Patient concerns have been addressed Patient grounding bracelet on and cord attached to chamber  Specifics for Inpatients (complete in addition to above) Medication sheet sent with patient NA Intravenous medications needed or due during therapy sent with patient NA Drainage tubes (e.g. nasogastric tube or chest tube secured and vented) NA Endotracheal or Tracheotomy tube secured NA Cuff deflated of air and inflated with saline NA Airway suctioned NA Notes The safety checklist was done before the treatment was started. Electronic Signature(s) Signed: 10/28/2021 11:38:24 AM By: [name] EMT Entered By: [name] on 10/28/2021 11:38:23

## 2021-10-28 NOTE — Progress Notes (Signed)
[name], [name] ([id number]) [id number] Page 1 of 2 Visit Report for 10/28/2021 Arrival Information Details Patient Name: Date of Service: [name], [name] 10/28/2021 8:00 [name] Medical Record Number: [id number] Patient Account Number: [id number] Date of Birth/Sex: Treating RN: 1970-09-14 (51 y.o. [name], [name] Primary Care Anjolina Byrer: [name] Other Clinician: [name] Referring Fairley Copher: Treating Tejay Hubert/Extender: [name]1 Visit Information History Since Last Visit All ordered tests and consults were completed: Yes Patient Arrived: Ambulatory Added or deleted any medications: No Arrival Time: 07:52 Any new allergies or adverse reactions: No Accompanied By: None Had a fall or experienced change [name] No Transfer Assistance: None activities of daily living that may affect Patient Identification Verified: Yes risk of falls: Secondary Verification Process Completed: Yes Signs or symptoms of abuse/neglect since last visito No Patient Requires Transmission-Based Precautions: No Hospitalized since last visit: No Patient Has Alerts: Yes Implantable device outside of the clinic excluding No cellular tissue based products placed [name] the center since last visit: Pain Present Now: No Electronic Signature(s) Signed: 10/28/2021 11:36:53 AM By: [name] EMT Entered By: [name] on 10/28/2021 11:36:53 -------------------------------------------------------------------------------- Encounter Discharge Information Details Patient Name: Date of Service: [name], [name] 10/28/2021 8:00 [name] Medical Record Number: [id number] Patient Account Number: [id number] Date of Birth/Sex: Treating RN: [birth year] (51 y.o. [name] Primary Care Jennice Renegar: [name] Other Clinician: [name] Referring Amen Staszak: Treating Darriel Sinquefield/Extender: [name]:  16 Encounter Discharge Information Items Discharge Condition: Stable Ambulatory Status: Ambulatory Discharge Destination: Home Transportation: Private Auto Accompanied By: None Schedule Follow-up Appointment: Yes Clinical Summary of Care: Electronic Signature(s) Signed: 10/28/2021 11:41:01 AM By: [name] EMT Entered By: [name] on 10/28/2021 11:41:01 [name], [name] ([id number]) [id number] Page 2 of 2 -------------------------------------------------------------------------------- Vitals Details Patient Name: Date of Service: [name], [name] 10/28/2021 8:00 [name] Medical Record Number: [id number] Patient Account Number: [id number] Date of Birth/Sex: Treating RN: 1970-08-24 (51 y.o. [name], [name] Primary Care Donzella Carrol: [name] Other Clinician: [name] Referring Keilani Terrance: Treating Imya Mance/Extender: [name]6 Vital Signs Time Taken: 08:05 Temperature (F): 98.4 Height ([name]: 72 Pulse (bpm): 99 Weight (lbs): 312 Respiratory Rate (breaths/min): 16 Body Mass Index (BMI): 42.3 Blood Pressure (mmHg): 138/77 Capillary Blood Glucose (mg/dl): 153 Reference Range: 80 - 120 mg / dl Electronic Signature(s) Signed: 10/28/2021 11:37:22 AM By: [name] EMT Entered By: [name] on 10/28/2021 11:37:22

## 2021-10-29 ENCOUNTER — Encounter (HOSPITAL_BASED_OUTPATIENT_CLINIC_OR_DEPARTMENT_OTHER): Payer: BLUE CROSS/BLUE SHIELD | Admitting: General Surgery

## 2021-10-29 ENCOUNTER — Encounter (HOSPITAL_BASED_OUTPATIENT_CLINIC_OR_DEPARTMENT_OTHER): Payer: BLUE CROSS/BLUE SHIELD | Admitting: Internal Medicine

## 2021-10-29 DIAGNOSIS — E1169 Type 2 diabetes mellitus with other specified complication: Secondary | ICD-10-CM | POA: Diagnosis not present

## 2021-10-29 DIAGNOSIS — E1149 Type 2 diabetes mellitus with other diabetic neurological complication: Secondary | ICD-10-CM

## 2021-10-29 DIAGNOSIS — M86671 Other chronic osteomyelitis, right ankle and foot: Secondary | ICD-10-CM

## 2021-10-29 DIAGNOSIS — L97514 Non-pressure chronic ulcer of other part of right foot with necrosis of bone: Secondary | ICD-10-CM | POA: Diagnosis not present

## 2021-10-29 LAB — GLUCOSE, CAPILLARY
Glucose-Capillary: 170 mg/dL — ABNORMAL HIGH (ref 70–99)
Glucose-Capillary: 159 mg/dL — ABNORMAL HIGH (ref 70–99)

## 2021-10-29 NOTE — Progress Notes (Signed)
[name], [name] ([id number]) [id number] Page 1 of 2 Visit Report for 10/29/2021 Problem List Details Patient Name: Date of Service: [name], [name] 10/29/2021 8:30 A M Medical Record Number: [id number] Patient Account Number: [id number] Date of Birth/Sex: Treating RN: 09-21-1970 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 10/29/2021 Medical Record Number: [id number] Patient Account Number: [id number] Date of Birth/Sex: Treating RN: [birth year] 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 E15.830 Other chronic osteomyelitis, right ankle and foot L97.514 Non-pressure chronic ulcer of other part of right foot with necrosis of bone [name], [name] ([id number]) [id number] Page 2 of  2 Facility Procedures : CPT4 Code Description: [id number]-(Facility Use Only) HBOT full body chamber, 97mn , ICD-10 Diagnosis Description E11.49 Type 2 diabetes mellitus with other diabetic neurolog L97.514 Non-pressure chronic ulcer of other part of right foo M86.671  Other chronic osteomyelitis, right ankle and foot Modifier: ical complication t with necrosis o Quantity: 4 f bone Physician Procedures : CPT4 Code Description Modifier [id number] PHYS HYPERBARIC OXYGEN THERAPY ICD-10 Diagnosis Description 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

## 2021-10-29 NOTE — Progress Notes (Signed)
[name], [name] ([id number]) [id number] Page 1 of 9 Visit Report for 10/29/2021 Chief Complaint Document Details Patient Name: Date of Service: [name], [name] 10/29/2021 7:45 [name] Medical Record Number: [id number] Patient Account Number: [id number] Date of Birth/Sex: Treating RN: May 30, 1970 [name] Primary Care Provider: [name] Other Clinician: Referring Provider: Treating Provider/Extender: [name] in Treatment: 16 Information Obtained from: Patient Chief Complaint Patients presents for treatment of an open diabetic ulcer Electronic Signature(s) Signed: 10/29/2021 7:56:40 AM By: [name] MD FACS Entered By: [name] on 10/29/2021 07:56:40 -------------------------------------------------------------------------------- Debridement Details Patient Name: Date of Service: [name], [name] 10/29/2021 7:45 [name] Medical Record Number: [id number] Patient Account Number: [id number] Date of Birth/Sex: Treating RN: 08-26-1970 [name] Primary Care Provider: [name] Other Clinician: Referring Provider: Treating Provider/Extender: [name] in Treatment: 16 Debridement Performed for Assessment: Wound #2 [place] Performed By: Physician [name], MD Debridement Type: Debridement Severity of Tissue Pre Debridement: Fat layer exposed Level of Consciousness (Pre-procedure): Awake and Alert Pre-procedure Verification/Time Out Yes - 07:59 Taken: Start Time: 07:59 Pain Control: Lidocaine 4% T opical Solution T Area Debrided (L x W): otal 0.1 (cm) x 2 (cm) = 0.2 (cm) Tissue and other material debrided: Non-Viable, Callus, Skin: Epidermis Level: Skin/Epidermis Debridement Description: Selective/Open Wound Instrument: Curette Bleeding: Minimum Hemostasis Achieved: Pressure Procedural Pain: 0 Post  Procedural Pain: 0 Response to Treatment: Procedure was tolerated well Level of Consciousness (Post- Awake and Alert procedure): Post Debridement Measurements of Total Wound Length: (cm) 0.1 Width: (cm) 2 Depth: (cm) 1.4 Volume: (cm) 0.22 Character of Wound/Ulcer Post Debridement: Improved Severity of Tissue Post Debridement: Fat layer exposed [name], [name] ([id number]) [id number] Page 2 of 9 Post Procedure Diagnosis Same as Pre-procedure Notes scribed for [name] by Adline Peals, RN Electronic Signature(s) Signed: 10/29/2021 9:27:14 AM By: [name] MD FACS Signed: 10/29/2021 4:02:37 PM By: Sabas Sous By: Adline Peals on 10/29/2021 08:16:46 -------------------------------------------------------------------------------- HPI Details Patient Name: Date of Service: [name], [name] 10/29/2021 7:45 [name] Medical Record Number: [id number] Patient Account Number: [id number] Date of Birth/Sex: Treating RN: October 24, 1970 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 The most recent amputation was in August 2022. At that operation, antibiotic beads were placed in the wound. He has been managed by podiatry for his procedures and management of his wounds. He has been in a Water engineer. He is on oral doxycycline. They have been using Betadine wet to dry dressings along with Iodosorb. The patient states that when he thinks the wound is getting too dry, he applies topical Neosporin. At his last visit, on June 7 of this year, the podiatrist  determined that he felt the wound was stalled and referred him to wound care for additional evaluation and management. An MRI has been ordered, but not yet scheduled or performed. Pathology from his operation in August 2022 demonstrated findings consistent with chronic osteomyelitis. Today, there is a large irregular wound on the plantar surface of his right foot, at about the level of the fifth metatarsal head. This tracks through to a  pinpoint opening on the dorsal lateral portion of his foot. The intake nurse reported purulent drainage. There is some malodor from the wound. No frank necrosis identified. 07/11/2021: Today, the wounds do not connect. I attempted multiple times from various directions and the shared tunnel is no longer open. He has some slough accumulation on the dorsal part of his foot as well as slough and callus buildup on the plantar surface. His MRI is scheduled for June 29. No purulent drainage or malodor appreciated today. 07/19/2021: The lateral foot wound has closed and there is no tunnel connecting the plantar foot wound to that site. The plantar foot wound still probes quite deeply, however. There is some slough, eschar, and nonviable tissue accumulated in the wound bed. No malodorous drainage present. His MRI was performed yesterday and is consistent with fairly extensive osteomyelitis. 07/29/2021: The patient has an appointment with infectious disease on July 18 to treat his osteomyelitis. The plantar wound still probes quite deeply, approaching bone. The orifice has narrowed quite substantially, however, making it more difficult for him to pack. 08/05/2021: The tunnel connecting the lateral foot wound to the plantar foot wound has reopened. He sees infectious disease tomorrow to discuss long-term antibiotic treatment for osteomyelitis. There was a bit of murky drainage in the wound, but this was noted after he had had topical lidocaine applied so may have just been a blob  of the anesthetic. No odor or frank purulent drainage. The wound probes deeply at the midfoot approaching bone. He does have MRI results confirming his diagnosis of osteomyelitis. He has failed to progress with conventional treatment and I think his best chance for preservation of the foot is to initiate hyperbaric oxygen therapy. 08/13/2021: The tunnel connecting the 2 wounds has closed again. He has a PICC line and is getting IV daptomycin and cefepime. EKG and chest x-ray are within normal limits. He is scheduled to start hyperbaric oxygen therapy tomorrow. The wound in his midfoot probes deeply, approaching bone. The skin at the orifice continues to heaped up and threatens to close over despite the large cavity within. No erythema, induration, or purulent drainage. The wound on his lateral foot is fairly small and quite clean. 08/19/2021: The lateral foot wound has nearly closed. The wound in his midfoot does not probe quite as deeply today. The skin at the orifice continues to try to roll in and obscure the opening. No frankly necrotic tissue appreciated. He is tolerating hyperbaric oxygen therapy. 08/26/2021: The lateral foot wound has closed completely. The wound in his midfoot is shallower again today. The wound orifice is contracting. We are using gentamicin and silver alginate. He continues on IV daptomycin and cefepime and is tolerating hyperbaric oxygen therapy without difficulty. 09/02/2021: His foot is a little bit sore today but he was up walking on it all weekend doing back to school shopping with his daughter. The tunneling is down to about 3 cm and I do not appreciate bone at the tip of the probe. The wound is clean but has some callus creating an overhanging lip at the distal aspect. He continues to receive hyperbaric oxygen therapy as well as IV daptomycin and cefepime. 09/09/2021: His wound continues to improve. The tunnel has come in by 0.9 cm. He continues to form callus around the  orifice of the wound. He is tolerating hyperbaric oxygen therapy along with his IV antibiotics. 09/16/2021: The tunneling is down to just half a centimeter. He continues to build up callus around the orifice  of the wound. He completed his course of IV antibiotics and his PICC line is scheduled to be removed this Friday. He is tolerating hyperbaric oxygen without difficulty. We have been applying topical gentamicin and silver alginate to his wound. [name], [name] ([id number]) [id number] Page 3 of 9 09/24/2021: The wound depth has come in even further. There is a little bit of callus buildup around the orifice. The opening is quite narrow, at this point. 09/30/2021: The wound depth is about the same this week. The opening continues to contract with callus accumulation. There is some discoloration of the skin on the lateral part of his foot and he admitted to walking more than usual this weekend and also wearing a different pair shoes. He continues to tolerate hyperbaric oxygen therapy without difficulty. 10/07/2021: The wound depth has contracted a bit and is now approximately 1 to 1.5 cm. The orifice of the wound continues to try and close in over the space. There is just some callus and skin around the margins obscuring the orifice. 10/14/2021: The wound depth has come in a little bit more. The orifice of the wound is rolling inwards with epithelium and callus, making it difficult to pack the wound. 10/21/2021: There is still 1 portion of the wound, at the lateral aspect, that is still a couple of centimeters deep. The architecture of the wound makes this somewhat challenging to access and pack. Everything else looks clean. He has his usual accumulation of callus and skin, narrowing the orifice. 10/29/2021: No significant change to the depth of the wound. The orifice continues to contract with callus and skin narrowing the opening. His wife has been packing the wound and doing  an excellent job. Electronic Signature(s) Signed: 10/29/2021 8:00:15 AM By: [name] MD FACS Entered By: [name] on 10/29/2021 08:00:14 -------------------------------------------------------------------------------- Physical Exam Details Patient Name: Date of Service: [name], [name] 10/29/2021 7:45 [name] Medical Record Number: [id number] Patient Account Number: [id number] Date of Birth/Sex: Treating RN: 11-May-1970 [name] Primary Care Provider: [name] Other Clinician: Referring Provider: Treating Provider/Extender: [name] in Treatment: 16 Constitutional Hypertensive, asymptomatic. Slightly tachycardic, asymptomatic. . . No acute distress.Marland Kitchen Respiratory Normal work of breathing on room air.. Notes 10/29/2021: No significant change to the depth of the wound. The orifice continues to contract with callus and skin narrowing the opening. Electronic Signature(s) Signed: 10/29/2021 8:00:50 AM By: [name] MD FACS Entered By: [name] on 10/29/2021 08:00:49 -------------------------------------------------------------------------------- Physician Orders Details Patient Name: Date of Service: [name], [name] 10/29/2021 7:45 [name] Medical Record Number: [id number] Patient Account Number: [id number] Date of Birth/Sex: Treating RN: 1970-05-29 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 - Dr Celine Ahr Room 2 Return A Anesthetic Wound #2 [place] (In clinic) Topical Lidocaine 4% applied to wound bed Cellular or Tissue Based Products Cellular or Tissue Based Product Type: - run insurance for a skin substitute - Kerecis powder Bathing/ Shower/ Hygiene Other Bathing/Shower/Hygiene Orders/Instructions: - Change dressing after bathing Off-Loading Other: - Try to  not stand on your feet too much Hyperbaric Oxygen Therapy Evaluate for HBO Therapy Indication: - wagner grade 3 diabetic foot ulcer right foot 2.0 ATA for 90 Minutes without A Breaks ir Total Number of Treatments: - 40 more treatments for a total of 80 09/30/21 One treatments per day (delivered Monday through Friday unless otherwise specified in Special Instructions below): Finger stick Blood Glucose Pre- and Post- HBOT Treatment. Follow Hyperbaric Oxygen Glycemia Protocol Afrin (Oxymetazoline HCL) 0.05% nasal spray - 1 spray in both nostrils daily as needed prior to HBO treatment for difficulty clearing ears Wound Treatment Wound #2 - Foot Wound Laterality: Plantar, Right, Lateral Cleanser: Soap and Water Every Other Day/15 Days Discharge Instructions: May shower and wash wound with dial antibacterial soap and water prior to dressing change. Cleanser: Wound Cleanser (Generic) Every Other Day/15 Days Discharge Instructions: Cleanse the wound with wound cleanser prior to applying a clean dressing using gauze sponges, not tissue or cotton balls. Peri-Wound Care: cotton tipped applicators (Generic) Every Other Day/15 Days Topical: Gentamicin Every Other Day/15 Days Discharge Instructions: As directed by physician Prim Dressing: Iodoform packing strip 1/4 (in) Every Other Day/15 Days ary Discharge Instructions: Lightly pack as instructed Secondary Dressing: Woven Gauze Sponge, Non-Sterile 4x4 in Every Other Day/15 Days Discharge Instructions: Apply  over primary dressing as directed. Secured With: Paper Tape, 2x10 (in/yd) (Generic) Every Other Day/15 Days Discharge Instructions: Secure dressing with tape as directed. Compression Wrap: Kerlix Roll 4.5x3.1 (in/yd) (Generic) Every Other Day/15 Days Discharge Instructions: Apply Kerlix and Coban compression as directed. Patient Medications llergies: No Known Allergies A Notifications Medication Indication Start End 10/29/2021 lidocaine DOSE topical 4 % cream - cream topical GLYCEMIA INTERVENTIONS PROTOCOL PRE-HBO GLYCEMIA INTERVENTIONS ACTION INTERVENTION Obtain pre-HBO capillary blood glucose (ensure 1 physician order is in chart). A. Notify HBO physician and await physician orders. 2 If result is 70 mg/dl or below: B. If the result meets the hospital definition of a critical result, follow hospital policy. A. Give patient an 8 ounce Glucerna Shake, an 8 ounce Ensure, or 8 ounces of a Glucerna/Ensure equivalent dietary supplement*. B. Wait 30 minutes. [name], [name] ([id number]) [id number] Page 5 of 9 If result is 71 mg/dl to 130 mg/dl: C. Retest patients capillary blood glucose (CBG). D. If result greater than or equal to 110 mg/dl, proceed with HBO. If result less than 110 mg/dl, notify HBO physician and consider holding HBO. If result is 131 mg/dl to 249 mg/dl: A. Proceed with HBO. A. Notify HBO physician and await physician orders. B. It is recommended to hold HBO and do If result is 250 mg/dl or greater: blood/urine ketone testing. C. If the result meets the hospital definition of a critical result, follow hospital policy. POST-HBO GLYCEMIA INTERVENTIONS ACTION INTERVENTION Obtain post HBO capillary blood glucose (ensure 1 physician order is in chart). A. Notify HBO physician and await physician orders. 2 If result is 70 mg/dl or below: B. If the result meets the hospital definition of a critical result, follow hospital  policy. A. Give patient an 8 ounce Glucerna Shake, an 8 ounce Ensure, or 8 ounces of a Glucerna/Ensure equivalent dietary supplement*. B. Wait 15 minutes for symptoms of If result is 71 mg/dl to 100 mg/dl: hypoglycemia (i.e. nervousness, anxiety, sweating, chills, clamminess, irritability, confusion, tachycardia or dizziness). C. If patient asymptomatic, discharge patient. If patient symptomatic, repeat capillary blood glucose (CBG) and notify HBO physician. If result is 101 mg/dl to 249 mg/dl: A. Discharge patient. A. Notify HBO physician and await  physician orders. B. It is recommended to do blood/urine ketone If result is 250 mg/dl or greater: testing. C. If the result meets the hospital definition of a critical result, follow hospital policy. *Juice or candies are NOT equivalent products. If patient refuses the Glucerna or Ensure, please consult the hospital dietitian for an appropriate substitute. Electronic Signature(s) Signed: 10/29/2021 9:27:14 AM By: [name] MD FACS Entered By: [name] on 10/29/2021 08:01:14 -------------------------------------------------------------------------------- Problem List Details Patient Name: Date of Service: [name], [name] 10/29/2021 7:45 [name] Medical Record Number: [id number] Patient Account Number: [id number] Date of Birth/Sex: Treating RN: 07-19-1970 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 8/92/1194 No Yes M86.671 Other chronic osteomyelitis, right ankle and foot 07/04/2021 No Yes L97.514 Non-pressure chronic ulcer of other part of right foot with necrosis of bone 07/04/2021 No Yes [name], [name] ([id number])  [id number] Page 6 of 9 Inactive Problems Resolved Problems Electronic Signature(s) Signed: 10/29/2021 7:56:28 AM By: [name] MD FACS Entered By: [name] on 10/29/2021 07:56:28 -------------------------------------------------------------------------------- Progress Note Details Patient Name: Date of Service: [name], [name] 10/29/2021 7:45 [name] Medical Record Number: [id number] Patient Account Number: [id number] Date of Birth/Sex: Treating RN: Mar 22, 1970 (50 y.o. 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 The most recent amputation was in August 2022. At that operation, antibiotic beads were placed in the wound. He has been managed by podiatry for his procedures and management of his wounds. He has been in a Water engineer. He is on oral doxycycline. They have been using Betadine wet to dry dressings along with Iodosorb. The patient states that when he thinks the wound is getting too dry, he applies topical Neosporin. At his last visit, on June 7 of this year, the podiatrist determined that he felt the wound was stalled and referred him to wound care for additional evaluation and management. An MRI has been ordered, but not yet scheduled or performed. Pathology from his operation in August 2022 demonstrated findings consistent with chronic osteomyelitis. Today, there is a large irregular wound on the plantar surface of his right foot, at about the  level of the fifth metatarsal head. This tracks through to a pinpoint opening on the dorsal lateral portion of his foot. The intake nurse reported purulent drainage. There is some malodor from the wound. No frank necrosis identified. 07/11/2021: Today, the wounds do not connect. I attempted multiple times from various directions and the shared tunnel is no longer open. He has some slough accumulation on the dorsal part of his foot as well as slough and callus buildup on the plantar surface. His MRI is scheduled for June 29. No purulent drainage or malodor appreciated today. 07/19/2021: The lateral foot wound has closed and there is no tunnel connecting the plantar foot wound to that site. The plantar foot wound still probes quite deeply, however. There is some  slough, eschar, and nonviable tissue accumulated in the wound bed. No malodorous drainage present. His MRI was performed yesterday and is consistent with fairly extensive osteomyelitis. 07/29/2021: The patient has an appointment with infectious disease on July 18 to treat his osteomyelitis. The plantar wound still probes quite deeply, approaching bone. The orifice has narrowed quite substantially, however, making it more difficult for him to pack. 08/05/2021: The tunnel connecting the lateral foot wound to the plantar foot wound has reopened. He sees infectious disease tomorrow to discuss long-term antibiotic treatment for osteomyelitis. There was a bit of murky drainage in the wound, but this was noted after he had had topical lidocaine applied so may have just been a blob of the anesthetic. No odor or frank purulent drainage. The wound probes deeply at the midfoot approaching bone. He does have MRI results confirming his diagnosis of osteomyelitis. He has failed to progress with conventional treatment and I think his best chance for preservation of the foot is to initiate hyperbaric oxygen therapy. 08/13/2021: The tunnel connecting the 2 wounds  has closed again. He has a PICC line and is getting IV daptomycin and cefepime. EKG and chest x-ray are within normal limits. He is scheduled to start hyperbaric oxygen therapy tomorrow. The wound in his midfoot probes deeply, approaching bone. The skin at the orifice continues to heaped up and threatens to close over despite the large cavity within. No erythema, induration, or purulent drainage. The wound on his lateral foot is fairly small and quite clean. 08/19/2021: The lateral foot wound has nearly closed. The wound in his midfoot does not probe quite as deeply today. The skin at the orifice continues to try to roll in and obscure the opening. No frankly necrotic tissue appreciated. He is tolerating hyperbaric oxygen therapy. 08/26/2021: The lateral foot wound has closed completely. The wound in his midfoot is shallower again today. The wound orifice is contracting. We are using gentamicin and silver alginate. He continues on IV daptomycin and cefepime and is tolerating hyperbaric oxygen therapy without difficulty. 09/02/2021: His foot is a little bit sore today but he was up walking on it all weekend doing back to school shopping with his daughter. The tunneling is down to about 3 cm and I do not appreciate bone at the tip of the probe. The wound is clean but has some callus creating an overhanging lip at the distal aspect. He continues to receive hyperbaric oxygen therapy as well as IV daptomycin and cefepime. 09/09/2021: His wound continues to improve. The tunnel has come in by 0.9 cm. He continues to form callus around the orifice of the wound. He is tolerating hyperbaric oxygen therapy along with his IV antibiotics. 09/16/2021: The tunneling is down to just half a centimeter. He continues to build up callus around the orifice of the wound. He completed his course of IV antibiotics and his PICC line is scheduled to be removed this Friday. He is tolerating hyperbaric oxygen without difficulty. We  have been applying topical [name], Saud [name] ([id number]) [id number] Page 7 of 9 gentamicin and silver alginate to his wound. 09/24/2021: The wound depth has come in even further. There is a little bit of callus buildup around the orifice. The opening is quite narrow, at this point. 09/30/2021: The wound depth is about the same this week. The opening continues to contract with callus accumulation. There is some discoloration of the skin on the lateral part of his foot and he admitted to walking more than usual this  weekend and also wearing a different pair shoes. He continues to tolerate hyperbaric oxygen therapy without difficulty. 10/07/2021: The wound depth has contracted a bit and is now approximately 1 to 1.5 cm. The orifice of the wound continues to try and close in over the space. There is just some callus and skin around the margins obscuring the orifice. 10/14/2021: The wound depth has come in a little bit more. The orifice of the wound is rolling inwards with epithelium and callus, making it difficult to pack the wound. 10/21/2021: There is still 1 portion of the wound, at the lateral aspect, that is still a couple of centimeters deep. The architecture of the wound makes this somewhat challenging to access and pack. Everything else looks clean. He has his usual accumulation of callus and skin, narrowing the orifice. 10/29/2021: No significant change to the depth of the wound. The orifice continues to contract with callus and skin narrowing the opening. His wife has been packing the wound and doing an excellent job. Patient History Information obtained from Patient. Social History Never smoker, Marital Status - Married, Alcohol Use - Never, Drug Use - No History, Caffeine Use - Daily - T coffee. ea; Medical History Endocrine Patient has history of Type II Diabetes Hospitalization/Surgery History - Amuptation of 3rd,4th and 5th toes of Right foot;Oral Surgery;Anal  Fissure surgery; Cholecystectomy. Objective Constitutional Hypertensive, asymptomatic. Slightly tachycardic, asymptomatic. No acute distress.. Vitals Time Taken: 7:44 AM, Height: 72 in, Weight: 312 lbs, BMI: 42.3, Temperature: 98.4 F, Pulse: 108 bpm, Respiratory Rate: 16 breaths/min, Blood Pressure: 152/80 mmHg. Respiratory Normal work of breathing on room air.. General Notes: 10/29/2021: No significant change to the depth of the wound. The orifice continues to contract with callus and skin narrowing the opening. Integumentary (Hair, Skin) Wound #2 status is Open. Original cause of wound was Gradually Appeared. The date acquired was: 12/19/2016. The wound has been in treatment 16 weeks. The wound is located on the [place]. The wound measures 0.1cm length x 2cm width x 1.4cm depth; 0.157cm^2 area and 0.22cm^3 volume. There is Fat Layer (Subcutaneous Tissue) exposed. There is no tunneling or undermining noted. There is a medium amount of serosanguineous drainage noted. The wound margin is thickened. There is large (67-100%) pink granulation within the wound bed. There is a small (1-33%) amount of necrotic tissue within the wound bed including Adherent Slough. The periwound skin appearance had no abnormalities noted for moisture. The periwound skin appearance had no abnormalities noted for color. The periwound skin appearance exhibited: Callus. Periwound temperature was noted as No Abnormality. 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 - Dr Celine Ahr Room 2 Anesthetic: [name], [name] ([id number]) [id number] Page 8 of 9 Wound #2 Right,Lateral,Plantar Foot: (In clinic) Topical Lidocaine 4% applied to  wound bed Cellular or Tissue Based Products: Cellular or Tissue Based Product Type: - run insurance for a skin substitute - Kerecis powder Bathing/ Shower/ Hygiene: Other Bathing/Shower/Hygiene Orders/Instructions: - Change dressing after bathing Off-Loading: Other: - Try to not stand on your feet too much Hyperbaric Oxygen Therapy: Evaluate for HBO Therapy Indication: - wagner grade 3 diabetic foot ulcer right foot 2.0 ATA for 90 Minutes without Air Breaks T Number of Treatments: - 40 more treatments for a total of 80 09/30/21 otal One treatments per day (delivered Monday  through Friday unless otherwise specified in Special Instructions below): Finger stick Blood Glucose Pre- and Post- HBOT Treatment. Follow Hyperbaric Oxygen Glycemia Protocol Afrin (Oxymetazoline HCL) 0.05% nasal spray - 1 spray in both nostrils daily as needed prior to HBO treatment for difficulty clearing ears The following medication(s) was prescribed: lidocaine topical 4 % cream cream topical was prescribed at facility WOUND #2: - Foot Wound Laterality: Plantar, Right, Lateral Cleanser: Soap and Water Every Other Day/15 Days Discharge Instructions: May shower and wash wound with dial antibacterial soap and water prior to dressing change. Cleanser: Wound Cleanser (Generic) Every Other Day/15 Days Discharge Instructions: Cleanse the wound with wound cleanser prior to applying a clean dressing using gauze sponges, not tissue or cotton balls. Peri-Wound Care: cotton tipped applicators (Generic) Every Other Day/15 Days Topical: Gentamicin Every Other Day/15 Days Discharge Instructions: As directed by physician Prim Dressing: Iodoform packing strip 1/4 (in) Every Other Day/15 Days ary Discharge Instructions: Lightly pack as instructed Secondary Dressing: Woven Gauze Sponge, Non-Sterile 4x4 in Every Other Day/15 Days Discharge Instructions: Apply over primary dressing as directed. Secured With: Paper T ape, 2x10 (in/yd)  (Generic) Every Other Day/15 Days Discharge Instructions: Secure dressing with tape as directed. Com pression Wrap: Kerlix Roll 4.5x3.1 (in/yd) (Generic) Every Other Day/15 Days Discharge Instructions: Apply Kerlix and Coban compression as directed. 10/29/2021: No significant change to the depth of the wound. The orifice continues to contract with callus and skin narrowing the opening. I used a curette to debride the callus and skin to keep the orifice open. I think we need to look at some sort of skin substitute so we will run his insurance for Kerecis powder that I can pack into the wound and try to get some closure in the tunnel. For now, we will continue to pack the wound with iodoform packing strips impregnated with gentamicin ointment. Follow-up in 1 week. Continue hyperbaric oxygen therapy. Electronic Signature(s) Signed: 10/29/2021 8:02:05 AM By: [name] MD FACS Entered By: [name] on 10/29/2021 08:02:05 -------------------------------------------------------------------------------- HxROS Details Patient Name: Date of Service: [name], [name] 10/29/2021 7:45 [name] Medical Record Number: [id number] Patient Account Number: [id number] Date of Birth/Sex: Treating RN: 06/01/1970 [name] Primary Care Provider: [name] Other Clinician: Referring Provider: Treating Provider/Extender: [name] in Treatment: 16 Information Obtained From Patient Endocrine Medical History: Positive for: Type II Diabetes Immunizations Pneumococcal Vaccine: Received Pneumococcal Vaccination: Yes Received Pneumococcal Vaccination On or After [address]: No [name], [name] ([id number]) [id number] Page 9 of 9 Implantable Devices None Hospitalization / Surgery History Type of Hospitalization/Surgery Amuptation of 3rd,4th and 5th toes of Right foot;Oral Surgery;Anal Fissure surgery;  Cholecystectomy Family and Social History Never smoker; Marital Status - Married; Alcohol Use: Never; Drug Use: No History; Caffeine Use: Daily - T coffee; Financial Concerns: No; Food, ea; Clothing or Shelter Needs: No; Support System Lacking: No; Transportation Concerns: No Electronic Signature(s) Signed: 10/29/2021 9:27:14 AM By: [name] MD FACS Signed: 10/29/2021 4:02:37 PM By: Adline Peals Entered By: [name] on 10/29/2021 08:00:23 -------------------------------------------------------------------------------- SuperBill Details Patient Name: Date of Service: [name], [name] 10/29/2021 Medical Record Number: [id number] Patient Account Number: [id number] Date of Birth/Sex: Treating RN: 11-09-1970 [name] Primary Care Provider: [name] Other Clinician: Referring Provider: Treating Provider/Extender: [name] in Treatment: 16 Diagnosis Coding ICD-10 Codes Code Description E11.49 Type 2 diabetes mellitus with other diabetic neurological complication C94.496 Other chronic osteomyelitis, right ankle  and foot L97.514 Non-pressure chronic ulcer of other part of right foot with necrosis of bone Facility Procedures : CPT4 Code: 89169450 Description: [id number] - DEBRIDE WOUND 1ST 20 SQ CM OR < ICD-10 Diagnosis Description L97.514 Non-pressure chronic ulcer of other part of right foot with necrosis of bone Modifier: Quantity: 1 Physician Procedures : CPT4 Code Description Modifier [id number] - WC PHYS LEVEL 4 - EST PT 25 ICD-10 Diagnosis Description L97.514 Non-pressure chronic ulcer of other part of right foot with necrosis of bone E11.49 Type 2 diabetes mellitus with other diabetic  neurological complication P91.505 Other chronic osteomyelitis, right ankle and foot Quantity: 1 : [id number] - WC PHYS DEBR WO ANESTH 20 SQ CM ICD-10 Diagnosis Description L97.514 Non-pressure chronic ulcer of other  part of right foot with necrosis of bone Quantity: 1 Electronic Signature(s) Signed: 10/29/2021 8:02:50 AM By: [name] MD FACS Entered By: [name] on 10/29/2021 08:02:49

## 2021-10-29 NOTE — Progress Notes (Signed)
[name] ([id number]) [id number] Page 1 of 2 Visit Report for 10/29/2021 Arrival Information Details Patient Name: Date of Service: [name], [name] 10/29/2021 8:30 [name] Medical Record Number: [id number] Patient Account Number: [id number] Date of Birth/Sex: Treating RN: 01/02/1971 (51 [name].o. [name] Primary Care Jaelle Campanile: [name] Other Clinician: [name] Referring Dicy Smigel: Treating Mechelle Pates/Extender: [name] in Treatment: 34 Visit Information History Since Last Visit All ordered tests and consults were completed: Yes Patient Arrived: Ambulatory Added or deleted any medications: No Arrival Time: 08:08 Any [name] allergies or adverse reactions: No Accompanied By: None Had a fall or experienced change in No Transfer Assistance: None activities of daily living that may affect Patient Identification Verified: Yes risk of falls: Patient Requires Transmission-Based Precautions: No Signs or symptoms of abuse/neglect since last visito No Patient Has Alerts: Yes Hospitalized since last visit: No Implantable device outside of the clinic excluding No cellular tissue based products placed in the center since last visit: Pain Present Now: No Electronic Signature(s) Signed: 10/29/2021 1:06:33 PM By: [name] EMT Entered By: [name] on 10/29/2021 13:06:33 -------------------------------------------------------------------------------- Encounter Discharge Information Details Patient Name: Date of Service: [name] [place], [name] 10/29/2021 8:30 [name] Medical Record Number: [id number] Patient Account Number: [id number] Date of Birth/Sex: Treating RN: 07/10/1970 (51 [name].o. [name] Primary Care Annison Birchard: [name] Other Clinician: [name] Referring Wyat Infinger: Treating Rhen Dossantos/Extender: [name] in Treatment: 16 Encounter Discharge Information  Items Discharge Condition: Stable Ambulatory Status: Ambulatory Discharge Destination: Home Transportation: Private Auto Accompanied By: None Schedule Follow-up Appointment: Yes Clinical Summary of Care: Electronic Signature(s) Signed: 10/29/2021 1:10:22 PM By: [name] EMT Entered By: [name] on 10/29/2021 13:10:21 [name] ([id number]) [id number] Page 2 of 2 -------------------------------------------------------------------------------- Vitals Details Patient Name: Date of Service: [name] [place], [name] 10/29/2021 8:30 [name] Medical Record Number: [id number] Patient Account Number: [id number] Date of Birth/Sex: Treating RN: 08-20-1970 (51 [name].o. [name] Primary Care Benjermin Korber: [name] Other Clinician: [name] Referring Alexzandria Massman: Treating Lilyan Prete/Extender: [name] in Treatment: 16 Vital Signs Time Taken: 08:14 Temperature (F): 98.6 Height (in): 72 Pulse (bpm): 101 Weight (lbs): 312 Respiratory Rate (breaths/min): 18 Body Mass Index (BMI): 42.3 Blood Pressure (mmHg): 138/82 Capillary Blood Glucose (mg/dl): 170 Reference Range: 80 - 120 mg / dl Electronic Signature(s) Signed: 10/29/2021 1:07:09 PM By: [name] EMT Entered By: [name] on 10/29/2021 13:07:09

## 2021-10-29 NOTE — Progress Notes (Signed)
[name], [name] ([id number]) [id number] Page 1 of 7 Visit Report for 10/29/2021 Arrival Information Details Patient Name: Date of Service: [name], [name] 10/29/2021 7:45 [name] Medical Record Number: [id number] Patient Account Number: [id number] Date of Birth/Sex: Treating RN: June 30, 1970 [name] Primary Care Amayiah Gosnell: [name] Other Clinician: Referring Aashvi Rezabek: Treating Reginald Mangels/Extender: [name] in Treatment: 43 Visit Information History Since Last Visit Added or deleted any medications: No Patient Arrived: Ambulatory Any new allergies or adverse reactions: No Arrival Time: 07:41 Had a fall or experienced change in No Accompanied By: self activities of daily living that may affect Transfer Assistance: None risk of falls: Patient Identification Verified: Yes Signs or symptoms of abuse/neglect since last visito No Secondary Verification Process Completed: Yes Hospitalized since last visit: No Patient Requires Transmission-Based Precautions: No Implantable device outside of the clinic excluding No Patient Has Alerts: Yes cellular tissue based products placed in the center since last visit: Has Dressing in Place as Prescribed: Yes Pain Present Now: No Electronic Signature(s) Signed: 10/29/2021 4:02:37 PM By: Adline Peals Entered By: Adline Peals on 10/29/2021 07:44:35 -------------------------------------------------------------------------------- Encounter Discharge Information Details Patient Name: Date of Service: [name], [name] 10/29/2021 7:45 [name] Medical Record Number: [id number] Patient Account Number: [id number] Date of Birth/Sex: Treating RN: April 09, 1970 [name] Primary Care Brenee Gajda: [name] Other Clinician: Referring Maxwell Lemen: Treating Brenda Samano/Extender: [name] in Treatment: 16 Encounter Discharge  Information Items Post Procedure Vitals Discharge Condition: Stable Temperature (F): 98.4 Ambulatory Status: Ambulatory Pulse (bpm): 108 Discharge Destination: Home Respiratory Rate (breaths/min): 16 Transportation: Private Auto Blood Pressure (mmHg): 152/80 Accompanied By: self Schedule Follow-up Appointment: Yes Clinical Summary of Care: Patient Declined Electronic Signature(s) Signed: 10/29/2021 4:02:37 PM By: Adline Peals Entered By: Adline Peals on 10/29/2021 08:17:19 [name], [name] ([id number]) [id number] Page 2 of 7 -------------------------------------------------------------------------------- Lower Extremity Assessment Details Patient Name: Date of Service: [name], [name] 10/29/2021 7:45 [name] Medical Record Number: [id number] Patient Account Number: [id number] Date of Birth/Sex: Treating RN: [birth year] [name] Primary Care Jamari Diana: [name] Other Clinician: Referring Wyett Narine: Treating Darelle Kings/Extender: [name] in Treatment: 16 Edema Assessment Assessed: [name]: No] [name]: No] Edema: [Left: N] [Right: o] Calf Left: Right: Point of Measurement: From Medial Instep 44.5 cm Ankle Left: Right: Point of Measurement: From Medial Instep 23 cm Vascular Assessment Pulses: Dorsalis Pedis Palpable: [Right:Yes] Electronic Signature(s) Signed: 10/29/2021 4:02:37 PM By: Adline Peals Entered By: Adline Peals on 10/29/2021 07:46:46 -------------------------------------------------------------------------------- Multi Wound Chart Details Patient Name: Date of Service: [name], [name] 10/29/2021 7:45 [name] Medical Record Number: [id number] Patient Account Number: [id number] Date of Birth/Sex: Treating RN: 03-23-1970 [name] Primary Care Charmagne Buhl: [name] Other Clinician: Referring Monterrius Cardosa: Treating Ryane Canavan/Extender: [name] in Treatment: 16 Vital Signs Height(in): 72 Pulse(bpm): 108 Weight(lbs): 312 Blood Pressure(mmHg): 152/80 Body Mass Index(BMI): 42.3 Temperature(F): 98.4 Respiratory Rate(breaths/min): 16 [2:Photos:] [N/A:N/A] Right, Lateral, Plantar Foot N/A N/A Wound Location: Gradually Appeared N/A N/A Wounding Event: Diabetic Wound/Ulcer of the Lower N/A N/A Primary Etiology: Extremity Type II Diabetes N/A N/A Comorbid History: 12/19/2016 N/A N/A Date Acquired: 16 N/A N/A Weeks of Treatment: Open N/A N/A Wound Status: No N/A N/A Wound Recurrence: 0.1x2x1.4 N/A N/A Measurements L x W x D (cm) 0.157 N/A N/A A (cm) : rea 0.22 N/A N/A Volume (cm) : 95.70% N/A N/A % Reduction in A  rea: 98.80% N/A N/A % Reduction in Volume: Grade 3 N/A N/A Classification: Medium N/A N/A Exudate A mount: Serosanguineous N/A N/A Exudate Type: red, brown N/A N/A Exudate Color: Thickened N/A N/A Wound Margin: Large (67-100%) N/A N/A Granulation A mount: Pink N/A N/A Granulation Quality: Small (1-33%) N/A N/A Necrotic A mount: Fat Layer (Subcutaneous Tissue): Yes N/A N/A Exposed Structures: Fascia: No Tendon: No Muscle: No Joint: No Bone: No Small (1-33%) N/A N/A Epithelialization: Callus: Yes N/A N/A Periwound Skin Texture: No Abnormalities Noted N/A N/A Periwound Skin Moisture: No Abnormalities Noted N/A N/A Periwound Skin Color: No Abnormality N/A N/A Temperature: Treatment Notes Electronic Signature(s) Signed: 10/29/2021 7:56:34 AM By: [name] MD FACS Signed: 10/29/2021 4:02:37 PM By: Adline Peals Entered By: [name] on 10/29/2021 07:56:34 -------------------------------------------------------------------------------- Multi-Disciplinary Care Plan Details Patient Name: Date of Service: [name], [name] 10/29/2021 7:45 [name] Medical Record Number: [id number] Patient Account Number: [id number] Date of Birth/Sex:  Treating RN: 12-08-1970 [name] Primary Care Sadao Weyer: [name] Other Clinician: Referring Bellina Tokarczyk: Treating Inola Lisle/Extender: [name] in Treatment: [place] reviewed with physician Active Inactive Nutrition Nursing Diagnoses: Impaired glucose control: actual or potential Potential for alteratiion in Nutrition/Potential for imbalanced nutrition Goals: Patient/caregiver will maintain therapeutic glucose control Date Initiated: 07/29/2021 Target Resolution Date: 11/19/2021 Goal Status: Active Interventions: Assess patient nutrition upon admission and as needed per policy Provide education on elevated blood sugars and impact on wound healing [name], [name] ([id number]) [id number] Page 4 of 7 Treatment Activities: Patient referred to Primary Care Physician for further nutritional evaluation : 07/29/2021 Notes: Osteomyelitis Nursing Diagnoses: Infection: osteomyelitis Knowledge deficit related to disease process and management Goals: Patient/caregiver will verbalize understanding of disease process and disease management Date Initiated: 07/29/2021 Target Resolution Date: 11/19/2021 Goal Status: Active Patient's osteomyelitis will resolve Date Initiated: 07/29/2021 Target Resolution Date: 11/19/2021 Goal Status: Active Interventions: Assess for signs and symptoms of osteomyelitis resolution every visit Provide education on osteomyelitis Treatment Activities: Consult for HBO : 07/29/2021 Notes: Wound/Skin Impairment Nursing Diagnoses: Impaired tissue integrity Goals: Patient/caregiver will verbalize understanding of skin care regimen Date Initiated: 07/04/2021 Target Resolution Date: 11/19/2021 Goal Status: Active Interventions: Assess ulceration(s) every visit Treatment Activities: Skin care regimen initiated : 07/04/2021 Notes: Electronic Signature(s) Signed:  10/29/2021 4:02:37 PM By: Adline Peals Entered By: Adline Peals on 10/29/2021 07:52:21 -------------------------------------------------------------------------------- Pain Assessment Details Patient Name: Date of Service: [name] [name] 10/29/2021 7:45 [name] Medical Record Number: [id number] Patient Account Number: [id number] Date of Birth/Sex: Treating RN: January 08, 1971 [name] Primary Care Trebor Galdamez: [name] Other Clinician: Referring Hazelgrace Bonham: Treating Rock Sobol/Extender: [name] in Treatment: 16 Active Problems Location of Pain Severity and Description of Pain Patient Has Paino No Site Locations Rate the pain. [name], [name] ([id number]) [id number] Page 5 of 7 Rate the pain. Current Pain Level: 0 Pain Management and Medication Current Pain Management: Electronic Signature(s) Signed: 10/29/2021 4:02:37 PM By: Adline Peals Entered By: Adline Peals on 10/29/2021 07:45:34 -------------------------------------------------------------------------------- Patient/Caregiver Education Details Patient Name: Date of Service: [name], [name] 10/10/2023andnbsp7:45 [name] Medical Record Number: [id number] Patient Account Number: [id number] Date of Birth/Gender: Treating RN: 04-16-1970 [name] Primary Care Physician: [name] Other Clinician: Referring Physician: Treating Physician/Extender: [name] in Treatment: 16 Education Assessment Education Provided To: Patient Education Topics Provided Wound/Skin Impairment: Methods: Explain/Verbal Responses: Reinforcements needed, State content correctly [employer]) Signed: 10/29/2021 4:02:37  PM By: Sabas Sous By: Adline Peals on 10/29/2021 07:52:31 -------------------------------------------------------------------------------- Wound  Assessment Details Patient Name: Date of Service: [name] 10/29/2021 7:45 [name] Medical Record Number: [id number] Patient Account Number: [id number] Date of Birth/Sex: Treating RN: [birth year] [name] Primary Care Carmeron Heady: [name] Other Clinician: Referring Tippi Mccrae: Treating Yeily Link/Extender: Cannon, Jennifer Hunter, Stephen [name], [name] ([id number]) [id number].pdf Page 6 of 7 Weeks in Treatment: 16 Wound Status Wound Number: 2 Primary Etiology: Diabetic Wound/Ulcer of the Lower Extremity Wound Location: Right, Lateral, Plantar Foot Wound Status: Open Wounding Event: Gradually Appeared Comorbid History: Type II Diabetes Date Acquired: 12/19/2016 Weeks Of Treatment: 16 Clustered Wound: No Photos Wound Measurements Length: (cm) 0.1 Width: (cm) 2 Depth: (cm) 1.4 Area: (cm) 0.157 Volume: (cm) 0.22 % Reduction in Area: 95.7% % Reduction in Volume: 98.8% Epithelialization: Small (1-33%) Tunneling: No Undermining: No Wound Description Classification: Grade 3 Wound Margin: Thickened Exudate Amount: Medium Exudate Type: Serosanguineous Exudate Color: red, brown Foul Odor After Cleansing: No Slough/Fibrino Yes Wound Bed Granulation Amount: Large (67-100%) Exposed Structure Granulation Quality: Pink Fascia Exposed: No Necrotic Amount: Small (1-33%) Fat Layer (Subcutaneous Tissue) Exposed: Yes Necrotic Quality: Adherent Slough Tendon Exposed: No Muscle Exposed: No Joint Exposed: No Bone Exposed: No Periwound Skin Texture Texture Color No Abnormalities Noted: No No Abnormalities Noted: Yes Callus: Yes Temperature / Pain Temperature: No Abnormality Moisture No Abnormalities Noted: Yes Treatment Notes Wound #2 (Foot) Wound Laterality: Plantar, Right, Lateral Cleanser Soap and Water Discharge Instruction: May shower and wash wound with dial antibacterial soap and water prior to dressing change. Wound  Cleanser Discharge Instruction: Cleanse the wound with wound cleanser prior to applying a clean dressing using gauze sponges, not tissue or cotton balls. Peri-Wound Care cotton tipped applicators Topical Gentamicin Discharge Instruction: As directed by physician Primary Dressing Iodoform packing strip 1/4 (in) [name], [name] ([id number]) [id number] Page 7 of 7 Discharge Instruction: Lightly pack as instructed Secondary Dressing Woven Gauze Sponge, Non-Sterile 4x4 in Discharge Instruction: Apply over primary dressing as directed. Secured With Paper Tape, 2x10 (in/yd) Discharge Instruction: Secure dressing with tape as directed. Compression Wrap Kerlix Roll 4.5x3.1 (in/yd) Discharge Instruction: Apply Kerlix and Coban compression as directed. Compression Stockings Add-Ons Electronic Signature(s) Signed: 10/29/2021 4:02:37 PM By: Adline Peals Entered By: Adline Peals on 10/29/2021 07:50:05 -------------------------------------------------------------------------------- Vitals Details Patient Name: Date of Service: [name] [name] 10/29/2021 7:45 [name] Medical Record Number: [id number] Patient Account Number: [id number] Date of Birth/Sex: Treating RN: 26-Feb-1970 [name] Primary Care Ray Glacken: [name] Other Clinician: Referring Noeh Sparacino: Treating Shuntel Fishburn/Extender: [name] in Treatment: 16 Vital Signs Time Taken: 07:44 Temperature (F): 98.4 Height (in): 72 Pulse (bpm): 108 Weight (lbs): 312 Respiratory Rate (breaths/min): 16 Body Mass Index (BMI): 42.3 Blood Pressure (mmHg): 152/80 Reference Range: 80 - 120 mg / dl Electronic Signature(s) Signed: 10/29/2021 4:02:37 PM By: Adline Peals Entered By: Adline Peals on 10/29/2021 07:45:28

## 2021-10-29 NOTE — Progress Notes (Signed)
[name], Markeis M ([id number]) [id number] Page 1 of 2 Visit Report for 10/28/2021 Problem List Details Patient Name: Date of Service: [name] 10/28/2021 8:00 A M Medical Record Number: [id number] Patient Account Number: [id number] Date of Birth/Sex: Treating RN: 03/02/1970 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 10/28/2021 Medical Record Number: [id number] Patient Account Number: [id number] Date of Birth/Sex: Treating RN: [birth year] 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 H54.562 Other chronic osteomyelitis, right ankle and foot L97.514 Non-pressure chronic ulcer of other part of right foot with necrosis of bone [name], [name] ([id number]) [id number] Page 2 of  2 Facility Procedures : CPT4 Code Description: [id number] G0277-(Facility Use Only) HBOT full body chamber, 71mn , ICD-10 Diagnosis Description E11.49 Type 2 diabetes mellitus with other diabetic neurolog L97.514 Non-pressure chronic ulcer of other part of right foo M86.671  Other chronic osteomyelitis, right ankle and foot Modifier: ical complication t with necrosis o Quantity: 4 f bone Physician Procedures : CPT4 Code Description Modifier [id number] 59741- WC PHYS HYPERBARIC OXYGEN THERAPY ICD-10 Diagnosis Description 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

## 2021-10-29 NOTE — Progress Notes (Addendum)
[name] [name], [name] [name] [place] ([id number]) [id number] Page 1 of 2 Visit Report for 10/29/2021 HBO Details Patient Name: Date of Service: [name] [name] 10/29/2021 8:30 A [name] Medical Record Number: [id number] Patient Account Number: [id number] Date of Birth/Sex: Treating RN: 1970-10-25 (51 y.o. [name] [name] Primary Care [name] [name]: [name] [name] Other Clinician: Valeria [name] Referring [name] [name]: Treating [name] [name]/Extender: [name] [name] in Treatment: 16 HBO Treatment Course Details Treatment Course Number: 1 Ordering [name] [name]: [name] [name] T Treatments Ordered: otal 80 HBO Treatment Start Date: 08/14/2021 HBO Indication: Diabetic Ulcer(s) of the Lower Extremity Standard/Conservative Wound Care tried and failed greater than or equal to 30 days HBO Treatment Details Treatment Number: 54 Patient Type: Outpatient Chamber Type: Monoplace Chamber Serial #: [id number] Treatment Protocol: 2.0 ATA with 90 minutes oxygen, and no air breaks Treatment Details Compression Rate Down: 2.0 psi / minute De-Compression Rate Up: 2.0 psi / minute Air breaks and breathing Decompress Decompress Compress Tx Pressure Begins Reached periods Begins Ends (leave unused spaces blank) Chamber Pressure (ATA 1 2 ------2 1 ) Clock Time (24 hr) 08:22 08:30 - - - - - - 10:00 10:08 Treatment Length: 106 (minutes) Treatment Segments: 4 Vital Signs Capillary Blood Glucose Reference Range: 80 - 120 mg / dl HBO Diabetic Blood Glucose Intervention Range: <131 mg/dl or >249 mg/dl Time Vitals Blood Respiratory Capillary Blood Glucose Pulse Action Type: Pulse: Temperature: Taken: Pressure: Rate: Glucose (mg/dl): Meter #: Oximetry (%) Taken: Pre 08:14 138/82 101 18 98.6 170 Post 10:15 131/84 83 16 98.3 159 Treatment Response Treatment Toleration: Well Treatment Completion Status: Treatment Completed without Adverse Event Additional Procedure  Documentation Tissue Sevierity: Necrosis of bone Physician HBO Attestation: I certify that I supervised this HBO treatment in accordance with Medicare guidelines. A trained emergency response team is readily available per Yes hospital policies and procedures. Continue HBOT as ordered. Yes Electronic Signature(s) Signed: 10/29/2021 4:32:14 PM By: [name] [name] Previous Signature: 10/29/2021 1:09:13 PM Version By: [name] [name] EMT Entered By: [name] [name] on 10/29/2021 16:[name]:53 [name] [name], [name] [name] ([id number]) [id number] Page 2 of 2 -------------------------------------------------------------------------------- HBO Safety Checklist Details Patient Name: Date of Service: [name] [name] 10/29/2021 8:30 A [name] Medical Record Number: [id number] Patient Account Number: [id number] Date of Birth/Sex: Treating RN: [name], 1972 (51 y.o. [name] [name] Primary Care [name] [name]: [name] [name] Other Clinician: Valeria [name] Referring [name] [name]: Treating [name] [name]/Extender: [name] [name] in Treatment: 16 HBO Safety Checklist Items Safety Checklist Consent Form Signed Patient voided / foley secured and emptied When did you last eato 0700 Last dose of injectable or oral agent 0705 Ostomy pouch emptied and vented if applicable NA All implantable devices assessed, documented and approved NA Intravenous access site secured and place NA Valuables secured Linens and cotton and cotton/polyester blend (less than 51% polyester) Personal oil-based products / skin lotions / body lotions removed Wigs or hairpieces removed NA Smoking or tobacco materials removed Books / newspapers / magazines / loose paper removed Cologne, aftershave, perfume and deodorant removed Jewelry removed ([name] wrap wedding band) NA Make-up removed NA Hair care products removed Battery operated devices (external) removed Heating patches and chemical warmers  removed Titanium eyewear removed NA Nail polish cured greater than 10 hours NA Casting material cured greater than 10 hours NA Hearing aids removed NA Loose dentures or partials removed NA Prosthetics have been removed NA Patient demonstrates correct use of air break device (if applicable) Patient concerns have been addressed Patient grounding bracelet on and cord attached to chamber  Specifics for Inpatients (complete in addition to above) Medication sheet sent with patient NA Intravenous medications needed or due during therapy sent with patient NA Drainage tubes (e.g. nasogastric tube or chest tube secured and vented) NA Endotracheal or Tracheotomy tube secured NA Cuff deflated of air and inflated with saline NA Airway suctioned NA Notes The safety checklist was done before the treatment was started. Electronic Signature(s) Signed: 10/29/2021 1:08:10 PM By: [name] [name] EMT Entered By: [name] [name] on 10/29/2021 13:08:09

## 2021-10-30 ENCOUNTER — Encounter (HOSPITAL_BASED_OUTPATIENT_CLINIC_OR_DEPARTMENT_OTHER): Payer: BLUE CROSS/BLUE SHIELD | Admitting: General Surgery

## 2021-10-30 DIAGNOSIS — E1169 Type 2 diabetes mellitus with other specified complication: Secondary | ICD-10-CM | POA: Diagnosis not present

## 2021-10-30 LAB — GLUCOSE, CAPILLARY
Glucose-Capillary: 147 mg/dL — ABNORMAL HIGH (ref 70–99)
Glucose-Capillary: 137 mg/dL — ABNORMAL HIGH (ref 70–99)

## 2021-10-30 NOTE — Progress Notes (Signed)
[name], Darold M ([id number]) [id number] Page 1 of 2 Visit Report for 10/30/2021 Arrival Information Details Patient Name: Date of Service: [place], [place] [name] 10/30/2021 8:00 A M Medical Record Number: [id number] Patient Account Number: [id number] Date of Birth/Sex: Treating RN: March 04, 1970 (51 y.o. [name], [name] Primary Care Abou Sterkel: [name] Other Clinician: [name] Referring Krissia Schreier: Treating Migel Hannis/Extender: [name] in Treatment: 16 Visit Information History Since Last Visit All ordered tests and consults were completed: Yes Patient Arrived: Ambulatory Added or deleted any medications: No Arrival Time: 07:44 Any new allergies or adverse reactions: No Accompanied By: None Had a fall or experienced change in No Transfer Assistance: None activities of daily living that may affect Patient Identification Verified: Yes risk of falls: Secondary Verification Process Completed: Yes Signs or symptoms of abuse/neglect since last visito No Patient Requires Transmission-Based Precautions: No Hospitalized since last visit: No Patient Has Alerts: Yes Implantable device outside of the clinic excluding No cellular tissue based products placed in the center since last visit: Pain Present Now: No Electronic Signature(s) Signed: 10/30/2021 1:40:47 PM By: [name] EMT Entered By: [name] on 10/30/2021 13:40:47 -------------------------------------------------------------------------------- Encounter Discharge Information Details Patient Name: Date of Service: [place], [name] 10/30/2021 8:00 A M Medical Record Number: [id number] Patient Account Number: [id number] Date of Birth/Sex: Treating RN: [birth year] (51 y.o. [name] Primary Care Donella Pascarella: [name] Other Clinician: [name] Referring Latamara Melder: Treating Kalid Ghan/Extender: [name] in  Treatment: 16 Encounter Discharge Information Items Discharge Condition: Stable Ambulatory Status: Ambulatory Discharge Destination: Home Transportation: Private Auto Accompanied By: None Schedule Follow-up Appointment: Yes Clinical Summary of Care: Electronic Signature(s) Signed: 10/30/2021 1:44:30 PM By: [name] EMT Entered By: [name] on 10/30/2021 13:44:30 [name], [name] ([id number]) [id number] Page 2 of 2 -------------------------------------------------------------------------------- Vitals Details Patient Name: Date of [name] 10/30/2021 8:00 A M Medical Record Number: [id number] Patient Account Number: [id number] Date of Birth/Sex: Treating RN: 1971-01-17 (50 y.o. [name] Primary Care Collyn Ribas: [name] Other Clinician: [name] Referring Man Effertz: Treating Bernadette Armijo/Extender: [name] in Treatment: 16 Vital Signs Time Taken: 08:00 Temperature (F): 98.8 Height (in): 72 Pulse (bpm): 99 Weight (lbs): 312 Respiratory Rate (breaths/min): 18 Body Mass Index (BMI): 42.3 Blood Pressure (mmHg): 137/83 Capillary Blood Glucose (mg/dl): 147 Reference Range: 80 - 120 mg / dl Electronic Signature(s) Signed: 10/30/2021 1:41:18 PM By: [name] EMT Entered By: [name] on 10/30/2021 13:41:18

## 2021-10-30 NOTE — Progress Notes (Addendum)
[name], [name] ([id number]) [id number] Page 1 of 2 Visit Report for 10/30/2021 HBO Details Patient Name: Date of Service: [name], [name] 10/30/2021 8:00 A M Medical Record Number: [id number] Patient Account Number: [id number] Date of Birth/Sex: Treating RN: 1970/11/22 [name] Primary Care [name]: [name] Other Clinician: [name] Referring [name]: Treating Trudi Morgenthaler/Extender: [name] in Treatment: 16 HBO Treatment Course Details Treatment Course Number: 1 Ordering [name]: [name] T Treatments Ordered: otal 80 HBO Treatment Start Date: 08/14/2021 HBO Indication: Diabetic Ulcer(s) of the Lower Extremity Standard/Conservative Wound Care tried and failed greater than or equal to 30 days HBO Treatment Details Treatment Number: 55 Patient Type: Outpatient Chamber Type: Monoplace Chamber Serial #: [id number] Treatment Protocol: 2.0 ATA with 90 minutes oxygen, and no air breaks Treatment Details Compression Rate Down: 2.0 psi / minute De-Compression Rate Up: 2.0 psi / minute Air breaks and breathing Decompress Decompress Compress Tx Pressure Begins Reached periods Begins Ends (leave unused spaces blank) Chamber Pressure (ATA 1 2 ------2 1 ) Clock Time (24 hr) 08:10 08:17 - - - - - - 09:48 10:00 Treatment Length: 110 (minutes) Treatment Segments: 4 Vital Signs Capillary Blood Glucose Reference Range: 80 - 120 mg / dl HBO Diabetic Blood Glucose Intervention Range: <131 mg/dl or >249 mg/dl Time Vitals Blood Respiratory Capillary Blood Glucose Pulse Action Type: Pulse: Temperature: Taken: Pressure: Rate: Glucose (mg/dl): Meter #: Oximetry (%) Taken: Pre 08:00 137/83 99 18 98.8 147 Post 10:06 134/87 90 16 98.1 137 Treatment Response Treatment Toleration: Well Treatment Completion Status: Treatment Completed without Adverse Event Additional Procedure  Documentation Tissue Sevierity: Necrosis of bone Physician HBO Attestation: I certify that I supervised this HBO treatment in accordance with Medicare guidelines. A trained emergency response team is readily available per Yes hospital policies and procedures. Continue HBOT as ordered. Yes Electronic Signature(s) Signed: 10/30/2021 1:56:20 PM By: [name] MD FACS Previous Signature: 10/30/2021 1:43:34 PM Version By: [name] EMT Entered By: [name] on 10/30/2021 13:56:19 [name], [name] ([id number]) [id number] Page 2 of 2 -------------------------------------------------------------------------------- HBO Safety Checklist Details Patient Name: Date of Service: [name], [name] 10/30/2021 8:00 A M Medical Record Number: [id number] Patient Account Number: [id number] Date of Birth/Sex: Treating RN: [birth year] [name] Primary Care [name]: [name] Other Clinician: [name] Referring [name]: Treating Nekeya Briski/Extender: [name] in Treatment: 16 HBO Safety Checklist Items Safety Checklist Consent Form Signed Patient voided / foley secured and emptied When did you last eato 0700 Last dose of injectable or oral agent 0700 Ostomy pouch emptied and vented if applicable NA All implantable devices assessed, documented and approved NA Intravenous access site secured and place NA Valuables secured Linens and cotton and cotton/polyester blend (less than 51% polyester) Personal oil-based products / skin lotions / body lotions removed Wigs or hairpieces removed NA Smoking or tobacco materials removed Books / newspapers / magazines / loose paper removed Cologne, aftershave, perfume and deodorant removed Jewelry removed (may wrap wedding band) NA Make-up removed NA Hair care products removed Battery operated devices (external) removed Heating patches and chemical warmers  removed Titanium eyewear removed NA Nail polish cured greater than 10 hours NA Casting material cured greater than 10 hours NA Hearing aids removed NA Loose dentures or partials removed NA Prosthetics have been removed NA Patient demonstrates correct use of air break device (if applicable) Patient concerns have been addressed Patient grounding bracelet on and cord attached to  chamber Specifics for Inpatients (complete in addition to above) Medication sheet sent with patient NA Intravenous medications needed or due during therapy sent with patient NA Drainage tubes (e.g. nasogastric tube or chest tube secured and vented) NA Endotracheal or Tracheotomy tube secured NA Cuff deflated of air and inflated with saline NA Airway suctioned NA Notes The safety checklist was done before the treatment was started. Electronic Signature(s) Signed: 10/30/2021 1:42:28 PM By: [name] EMT Entered By: [name] on 10/30/2021 13:42:28

## 2021-10-30 NOTE — Progress Notes (Signed)
[name], Auther M ([id number]) [id number] Page 1 of 2 Visit Report for 10/30/2021 Problem List Details Patient Name: Date of Service: [name], [name] 10/30/2021 8:00 A M Medical Record Number: [id number] Patient Account Number: [id number] Date of Birth/Sex: Treating RN: 10/15/1970 (51 y.o. 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 10/30/2021 Medical Record Number: [id number] Patient Account Number: [id number] Date of Birth/Sex: Treating RN: [birth year] (51 y.o. 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 F38.329 Other chronic osteomyelitis, right ankle and foot L97.514 Non-pressure chronic ulcer of other part of right foot with necrosis of bone [name], [place] M ([id number]) [id number] Page 2 of  2 Facility Procedures : CPT4 Code Description: 45997741 G0277-(Facility Use Only) HBOT full body chamber, 59mn , ICD-10 Diagnosis Description 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 L97.514 Non-pressure chronic ulcer of other part of right foo M86.671  Other chronic osteomyelitis, right ankle and foot Modifier: ical complication t with necrosis o Quantity: 4 f bone Physician Procedures : CPT4 Code Description Modifier [id number] 02334- WC PHYS HYPERBARIC OXYGEN THERAPY ICD-10 Diagnosis Description 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

## 2021-10-31 ENCOUNTER — Encounter (HOSPITAL_BASED_OUTPATIENT_CLINIC_OR_DEPARTMENT_OTHER): Payer: BLUE CROSS/BLUE SHIELD | Admitting: General Surgery

## 2021-10-31 DIAGNOSIS — E1169 Type 2 diabetes mellitus with other specified complication: Secondary | ICD-10-CM | POA: Diagnosis not present

## 2021-10-31 LAB — GLUCOSE, CAPILLARY
Glucose-Capillary: 128 mg/dL — ABNORMAL HIGH (ref 70–99)
Glucose-Capillary: 146 mg/dL — ABNORMAL HIGH (ref 70–99)
Glucose-Capillary: 149 mg/dL — ABNORMAL HIGH (ref 70–99)

## 2021-10-31 NOTE — Progress Notes (Addendum)
[name], [name] ([id number]) [id number] Page 1 of 2 Visit Report for 10/31/2021 HBO Details Patient Name: Date of Service: [name], [name] 10/31/2021 8:00 A M Medical Record Number: [id number] Patient Account Number: [id number] Date of Birth/Sex: Treating RN: [birth year] [name], [name] Primary Care [name]: [name] Other Clinician: Valeria [name] Referring [name]: Treating [name]/Extender: [name] in Treatment: 17 HBO Treatment Course Details Treatment Course Number: 1 Ordering [name]: [name] T Treatments Ordered: otal 80 HBO Treatment Start Date: 08/14/2021 HBO Indication: Diabetic Ulcer(s) of the Lower Extremity Standard/Conservative Wound Care tried and failed greater than or equal to 30 days HBO Treatment Details Treatment Number: 56 Patient Type: Outpatient Chamber Type: Monoplace Chamber Serial #: [id number] Treatment Protocol: 2.0 ATA with 90 minutes oxygen, and no air breaks Treatment Details Compression Rate Down: 2.0 psi / minute De-Compression Rate Up: 2.0 psi / minute Air breaks and breathing Decompress Decompress Compress Tx Pressure Begins Reached periods Begins Ends (leave unused spaces blank) Chamber Pressure (ATA 1 2 ------2 1 ) Clock Time (24 hr) 08:38 08:45 - - - - - - 10:15 10:24 Treatment Length: 106 (minutes) Treatment Segments: 4 Vital Signs Capillary Blood Glucose Reference Range: 80 - 120 mg / dl HBO Diabetic Blood Glucose Intervention Range: <131 mg/dl or >249 mg/dl Type: Time Vitals Blood Pulse: Respiratory Capillary Blood Glucose Pulse Action Temperature: Taken: Pressure: Rate: Glucose (mg/dl): Meter #: Oximetry (%) Taken: Pre 08:17 123/78 99 18 98.2 128 Patient given 8 oz glucerna Post 10:27 130/83 95 16 97.7 149 Pre 08:35 146 Treatment Response Treatment Toleration: Well Treatment Completion Status: Treatment Completed without Adverse  Event Additional Procedure Documentation Tissue Sevierity: Necrosis of bone Physician HBO Attestation: I certify that I supervised this HBO treatment in accordance with Medicare guidelines. A trained emergency response team is readily available per Yes hospital policies and procedures. Continue HBOT as ordered. Yes Electronic Signature(s) Signed: 10/31/2021 3:04:16 PM By: [name] Previous Signature: 10/31/2021 1:52:22 PM Version By: [name] EMT Entered By: [name] on 10/31/2021 15:04:15 [name], [name] ([id number]) [id number] Page 2 of 2 -------------------------------------------------------------------------------- HBO Safety Checklist Details Patient Name: Date of Service: [name], [name] 10/31/2021 8:00 A M Medical Record Number: [id number] Patient Account Number: [id number] Date of Birth/Sex: Treating RN: 05/14/1970 [name], [name] Primary Care [name]: [name] Other Clinician: Valeria [name] Referring [name]: Treating [name]/Extender: [name] in Treatment: 17 HBO Safety Checklist Items Safety Checklist Consent Form Signed Patient voided / foley secured and emptied When did you last eato 0715 Last dose of injectable or oral agent 0710 Ostomy pouch emptied and vented if applicable NA All implantable devices assessed, documented and approved NA Intravenous access site secured and place NA Valuables secured Linens and cotton and cotton/polyester blend (less than 51% polyester) Personal oil-based products / skin lotions / body lotions removed Wigs or hairpieces removed NA Smoking or tobacco materials removed Books / newspapers / magazines / loose paper removed Cologne, aftershave, perfume and deodorant removed Jewelry removed (may wrap wedding band) NA Make-up removed NA Hair care products removed Battery operated devices (external) removed Heating  patches and chemical warmers removed Titanium eyewear removed NA Nail polish cured greater than 10 hours NA Casting material cured greater than 10 hours NA Hearing aids removed NA Loose dentures or partials removed NA Prosthetics have been removed NA Patient demonstrates correct use of air break device (if applicable) Patient concerns have been addressed  Patient grounding bracelet on and cord attached to chamber Specifics for Inpatients (complete in addition to above) Medication sheet sent with patient NA Intravenous medications needed or due during therapy sent with patient NA Drainage tubes (e.g. nasogastric tube or chest tube secured and vented) NA Endotracheal or Tracheotomy tube secured NA Cuff deflated of air and inflated with saline NA Airway suctioned NA Notes The safety checklist was done before the treatment was started. Electronic Signature(s) Signed: 10/31/2021 1:50:08 PM By: [name] EMT Entered By: [name] on 10/31/2021 13:50:08

## 2021-10-31 NOTE — Progress Notes (Signed)
[name], [name] ([id number]) [id number] Page 1 of 2 Visit Report for 10/31/2021 Problem List Details Patient Name: Date of Service: [name], [name] 10/31/2021 8:00 [name] Medical Record Number: [id number] Patient Account Number: [id number] Date of Birth/Sex: Treating RN: 29-Sep-1970 (51 y.o. 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 10/31/2021 Medical Record Number: [id number] Patient Account Number: [id number] Date of Birth/Sex: Treating RN: 14-Jun-1970 (51 y.o. 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 I43.329 Other chronic osteomyelitis, right ankle and foot L97.514 Non-pressure chronic ulcer of other part of right foot with necrosis of bone [name], [name] ([id number]) [id number] Page  2 of 2 Facility Procedures : CPT4 Code Description: [id number] G0277-(Facility Use Only) HBOT full body chamber, 71mn , ICD-10 Diagnosis Description 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 L97.514 Non-pressure chronic ulcer of other part of right foo M86.671  Other chronic osteomyelitis, right ankle and foot Modifier: ical complication t with necrosis o Quantity: 4 f bone Physician Procedures : CPT4 Code Description Modifier [id number] 32202- WC PHYS HYPERBARIC OXYGEN THERAPY ICD-10 Diagnosis Description 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

## 2021-10-31 NOTE — Progress Notes (Addendum)
[name], [name] ([id number]) [id number] Page 1 of 2 Visit Report for 10/31/2021 Arrival Information Details Patient Name: Date of Service: [place], [name] 10/31/2021 8:00 [name] Medical Record Number: [id number] Patient Account Number: [id number] Date of Birth/Sex: Treating RN: 1970-09-15 (51 y.o. [name], [name] Primary Care Kenry Daubert: [name] Other Clinician: [name] Referring Kiree Dejarnette: Treating Courtnee Myer/Extender: [name]: 40 Visit Information History Since Last Visit All ordered tests and consults were completed: Yes Patient Arrived: Ambulatory Added or deleted any medications: No Arrival Time: 08:02 Any new allergies or adverse reactions: No Accompanied By: None Had a fall or experienced change [name] No Transfer Assistance: None activities of daily living that may affect Patient Identification Verified: Yes risk of falls: Secondary Verification Process Completed: Yes Signs or symptoms of abuse/neglect since last visito No Patient Requires Transmission-Based Precautions: No Hospitalized since last visit: No Patient Has Alerts: Yes Implantable device outside of the clinic excluding No cellular tissue based products placed [name] the center since last visit: Pain Present Now: No Electronic Signature(s) Signed: 10/31/2021 1:48:41 PM By: [name] EMT Entered By: [name] on 10/31/2021 13:48:41 -------------------------------------------------------------------------------- Encounter Discharge Information Details Patient Name: Date of Service: [name], [name] 10/31/2021 8:00 [name] Medical Record Number: [id number] Patient Account Number: [id number] Date of Birth/Sex: Treating RN: Jan 22, 1970 (51 y.o. [name], [name] Primary Care Shamar Engelmann: [name] Other Clinician: [name] Referring Shagun Wordell: Treating Tameshia Bonneville/Extender: [name]  Treatment: 17 Encounter Discharge Information Items Discharge Condition: Stable Ambulatory Status: Ambulatory Discharge Destination: Home Transportation: Private Auto Accompanied By: None Schedule Follow-up Appointment: Yes Clinical Summary of Care: Electronic Signature(s) Signed: 10/31/2021 1:53:17 PM By: [name] EMT Entered By: [name] on 10/31/2021 13:53:17 [name], [name] ([id number]) [id number] Page 2 of 2 -------------------------------------------------------------------------------- Vitals Details Patient Name: Date of Service: [name], [name] 10/31/2021 8:00 [name] Medical Record Number: [id number] Patient Account Number: [id number] Date of Birth/Sex: Treating RN: 1970/04/21 (51 y.o. [name], [name] Primary Care Tynia Wiers: [name] Other Clinician: [name] Referring Shanteria Laye: Treating Duval Macleod/Extender: [name]7 Vital Signs Time Taken: 08:17 Temperature (F): 98.2 Height ([name]: 72 Pulse (bpm): 99 Weight (lbs): 312 Respiratory Rate (breaths/min): 18 Body Mass Index (BMI): 42.3 Blood Pressure (mmHg): 123/78 Capillary Blood Glucose (mg/dl): 128 Reference Range: 80 - 120 mg / dl Electronic Signature(s) Signed: 10/31/2021 1:49:10 PM By: [name] EMT Entered By: [name] on 10/31/2021 13:49:10

## 2021-11-01 ENCOUNTER — Encounter (HOSPITAL_BASED_OUTPATIENT_CLINIC_OR_DEPARTMENT_OTHER): Payer: BLUE CROSS/BLUE SHIELD | Admitting: Internal Medicine

## 2021-11-01 DIAGNOSIS — E1149 Type 2 diabetes mellitus with other diabetic neurological complication: Secondary | ICD-10-CM

## 2021-11-01 DIAGNOSIS — L97514 Non-pressure chronic ulcer of other part of right foot with necrosis of bone: Secondary | ICD-10-CM | POA: Diagnosis not present

## 2021-11-01 DIAGNOSIS — M86671 Other chronic osteomyelitis, right ankle and foot: Secondary | ICD-10-CM | POA: Diagnosis not present

## 2021-11-01 DIAGNOSIS — E1169 Type 2 diabetes mellitus with other specified complication: Secondary | ICD-10-CM | POA: Diagnosis not present

## 2021-11-01 LAB — GLUCOSE, CAPILLARY
Glucose-Capillary: 200 mg/dL — ABNORMAL HIGH (ref 70–99)
Glucose-Capillary: 171 mg/dL — ABNORMAL HIGH (ref 70–99)

## 2021-11-01 NOTE — Progress Notes (Signed)
[name], Nijel M ([id number]) [id number] Page 1 of 2 Visit Report for 11/01/2021 Arrival Information Details Patient Name: Date of Service: [place], [place] [name] 11/01/2021 8:00 A M Medical Record Number: [id number] Patient Account Number: [id number] Date of Birth/Sex: Treating RN: 1970-05-14 (51 y.o. [name] Primary Care Jabri Blancett: [name] Other Clinician: [name] Referring Kemal Amores: Treating Johndaniel Catlin/Extender: [name] in Treatment: 36 Visit Information History Since Last Visit All ordered tests and consults were completed: Yes Patient Arrived: Ambulatory Added or deleted any medications: No Arrival Time: 08:02 Any new allergies or adverse reactions: No Accompanied By: None Had a fall or experienced change in No Transfer Assistance: None activities of daily living that may affect Patient Identification Verified: Yes risk of falls: Secondary Verification Process Completed: Yes Signs or symptoms of abuse/neglect since last visito No Patient Requires Transmission-Based Precautions: No Hospitalized since last visit: No Patient Has Alerts: Yes Implantable device outside of the clinic excluding No cellular tissue based products placed in the center since last visit: Pain Present Now: No Electronic Signature(s) Signed: 11/01/2021 1:50:03 PM By: [name] EMT Entered By: [name] on 11/01/2021 13:50:02 -------------------------------------------------------------------------------- Encounter Discharge Information Details Patient Name: Date of Service: [place], [name] 11/01/2021 8:00 A M Medical Record Number: [id number] Patient Account Number: [id number] Date of Birth/Sex: Treating RN: Jun 05, 1970 (51 y.o. [name] Primary Care Moises Terpstra: [name] Other Clinician: [name] Referring Dajuana Palen: Treating Maronda Caison/Extender: [name] in  Treatment: 17 Encounter Discharge Information Items Discharge Condition: Stable Ambulatory Status: Ambulatory Discharge Destination: Home Transportation: Private Auto Accompanied By: None Schedule Follow-up Appointment: Yes Clinical Summary of Care: Electronic Signature(s) Signed: 11/01/2021 1:54:44 PM By: [name] EMT Entered By: [name] on 11/01/2021 13:54:44 [name], [name] ([id number]) [id number] Page 2 of 2 -------------------------------------------------------------------------------- Vitals Details Patient Name: Date of Service: [name], [name] 11/01/2021 8:00 A M Medical Record Number: [id number] Patient Account Number: [id number] Date of Birth/Sex: Treating RN: 06-03-1970 (51 y.o. [name] Primary Care Deangleo Passage: [name] Other Clinician: [name] Referring Zarya Lasseigne: Treating Elwyn Lowden/Extender: [name] in Treatment: 17 Vital Signs Time Taken: 08:14 Temperature (F): 98.4 Height (in): 72 Pulse (bpm): 98 Weight (lbs): 312 Respiratory Rate (breaths/min): 18 Body Mass Index (BMI): 42.3 Blood Pressure (mmHg): 147/80 Capillary Blood Glucose (mg/dl): 200 Reference Range: 80 - 120 mg / dl Electronic Signature(s) Signed: 11/01/2021 1:51:11 PM By: [name] EMT Entered By: [name] on 11/01/2021 13:51:11

## 2021-11-01 NOTE — Progress Notes (Signed)
[name], [name] ([id number]) [id number] Page 1 of 2 Visit Report for 11/01/2021 HBO Details Patient Name: Date of Service: [name], [name] 11/01/2021 8:00 A M Medical Record Number: [id number] Patient Account Number: [id number] Date of Birth/Sex: Treating RN: [birth year] [name] Primary Care Maysen Sudol: [name] Other Clinician: [name] Referring Adarrius Graeff: Treating Bobie Caris/Extender: [name] in Treatment: 17 HBO Treatment Course Details Treatment Course Number: 1 Ordering Jaislyn Blinn: [name] T Treatments Ordered: otal 80 HBO Treatment Start Date: 08/14/2021 HBO Indication: Diabetic Ulcer(s) of the Lower Extremity Standard/Conservative Wound Care tried and failed greater than or equal to 30 days HBO Treatment Details Treatment Number: 57 Patient Type: Outpatient Chamber Type: Monoplace Chamber Serial #: [id number] Treatment Protocol: 2.0 ATA with 90 minutes oxygen, and no air breaks Treatment Details Compression Rate Down: 2.0 psi / minute De-Compression Rate Up: 2.0 psi / minute Air breaks and breathing Decompress Decompress Compress Tx Pressure Begins Reached periods Begins Ends (leave unused spaces blank) Chamber Pressure (ATA 1 2 ------2 1 ) Clock Time (24 hr) 08:19 08:30 - - - - - - 10:00 10:08 Treatment Length: 109 (minutes) Treatment Segments: 4 Vital Signs Capillary Blood Glucose Reference Range: 80 - 120 mg / dl HBO Diabetic Blood Glucose Intervention Range: <131 mg/dl or >249 mg/dl Time Vitals Blood Respiratory Capillary Blood Glucose Pulse Action Type: Pulse: Temperature: Taken: Pressure: Rate: Glucose (mg/dl): Meter #: Oximetry (%) Taken: Pre 08:14 147/80 98 18 98.4 200 Post 10:13 141/85 92 16 97.7 171 Treatment Response Treatment Toleration: Well Treatment Completion Status: Treatment Completed without Adverse Event Additional Procedure  Documentation Tissue Sevierity: Necrosis of bone Physician HBO Attestation: I certify that I supervised this HBO treatment in accordance with Medicare guidelines. A trained emergency response team is readily available per Yes hospital policies and procedures. Continue HBOT as ordered. Yes Electronic Signature(s) Signed: 11/04/2021 9:27:26 AM By: [name] DO Signed: 11/06/2021 4:30:03 PM By: Linton Ham MD Previous Signature: 11/01/2021 1:53:47 PM Version By: [name] EMT Entered By: Linton Ham on 11/01/2021 17:06:37 [name], [name] ([id number]) [id number] Page 2 of 2 -------------------------------------------------------------------------------- HBO Safety Checklist Details Patient Name: Date of Service: [name], [name] 11/01/2021 8:00 A M Medical Record Number: [id number] Patient Account Number: [id number] Date of Birth/Sex: Treating RN: 03-06-1970 [name] Primary Care Zettie Gootee: [name] Other Clinician: [name] Referring Adedamola Seto: Treating Karielle Davidow/Extender: [name] in Treatment: 17 HBO Safety Checklist Items Safety Checklist Consent Form Signed Patient voided / foley secured and emptied When did you last eato 0710 Last dose of injectable or oral agent 0700 Ostomy pouch emptied and vented if applicable NA All implantable devices assessed, documented and approved NA Intravenous access site secured and place NA Valuables secured Linens and cotton and cotton/polyester blend (less than 51% polyester) Personal oil-based products / skin lotions / body lotions removed Wigs or hairpieces removed NA Smoking or tobacco materials removed Books / newspapers / magazines / loose paper removed Cologne, aftershave, perfume and deodorant removed Jewelry removed (may wrap wedding band) NA Make-up removed NA Hair care products removed Battery operated devices (external)  removed Heating patches and chemical warmers removed Titanium eyewear removed NA Nail polish cured greater than 10 hours NA Casting material cured greater than 10 hours NA Hearing aids removed NA Loose dentures or partials removed NA Prosthetics have been removed NA Patient demonstrates correct use of air break device (if applicable) Patient concerns have been addressed Patient  grounding bracelet on and cord attached to chamber Specifics for Inpatients (complete in addition to above) Medication sheet sent with patient NA Intravenous medications needed or due during therapy sent with patient NA Drainage tubes (e.g. nasogastric tube or chest tube secured and vented) NA Endotracheal or Tracheotomy tube secured NA Cuff deflated of air and inflated with saline NA Airway suctioned NA Notes The safety checklist was done before the treatment was started. Electronic Signature(s) Signed: 11/01/2021 1:52:40 PM By: [name] EMT Entered By: [name] on 11/01/2021 13:52:40

## 2021-11-04 ENCOUNTER — Encounter (HOSPITAL_BASED_OUTPATIENT_CLINIC_OR_DEPARTMENT_OTHER): Payer: BLUE CROSS/BLUE SHIELD | Admitting: Internal Medicine

## 2021-11-04 DIAGNOSIS — E1149 Type 2 diabetes mellitus with other diabetic neurological complication: Secondary | ICD-10-CM | POA: Diagnosis not present

## 2021-11-04 DIAGNOSIS — M86671 Other chronic osteomyelitis, right ankle and foot: Secondary | ICD-10-CM | POA: Diagnosis not present

## 2021-11-04 DIAGNOSIS — L97514 Non-pressure chronic ulcer of other part of right foot with necrosis of bone: Secondary | ICD-10-CM

## 2021-11-04 DIAGNOSIS — E1169 Type 2 diabetes mellitus with other specified complication: Secondary | ICD-10-CM | POA: Diagnosis not present

## 2021-11-04 LAB — GLUCOSE, CAPILLARY
Glucose-Capillary: 173 mg/dL — ABNORMAL HIGH (ref 70–99)
Glucose-Capillary: 144 mg/dL — ABNORMAL HIGH (ref 70–99)

## 2021-11-04 NOTE — Progress Notes (Addendum)
[name], [name] ([id number]) [id number] Page 1 of 2 Visit Report for 11/04/2021 HBO Details Patient Name: Date of Service: [name], [name] 11/04/2021 8:00 [name] Medical Record Number: [id number] Patient Account Number: [id number] Date of Birth/Sex: Treating RN: [birth year] (51 y.o. [name] Takota Cahalan: [name] Other Clinician: [name] Referring Casen Pryor: Treating Concettina Leth/Extender: [name] in Treatment: 17 HBO Treatment Course Details Treatment Course Number: 1 Ordering Jenni Thew: [name] T Treatments Ordered: otal 80 HBO Treatment Start Date: 08/14/2021 HBO Indication: Diabetic Ulcer(s) of the Lower Extremity Standard/Conservative Wound Care tried and failed greater than or equal to 30 days HBO Treatment Details Treatment Number: 58 Patient Type: Outpatient Chamber Type: Monoplace Chamber Serial #: [id number] Treatment Protocol: 2.0 ATA with 90 minutes oxygen, and no air breaks Treatment Details Compression Rate Down: 2.0 psi / minute De-Compression Rate Up: 2.0 psi / minute Air breaks and breathing Decompress Decompress Compress Tx Pressure Begins Reached periods Begins Ends (leave unused spaces blank) Chamber Pressure (ATA 1 2 ------2 1 ) Clock Time (24 hr) 08:13 10:22 - - - - - - 09:52 10:00 Treatment Length: 107 (minutes) Treatment Segments: 4 Vital Signs Capillary Blood Glucose Reference Range: 80 - 120 mg / dl HBO Diabetic Blood Glucose Intervention Range: <131 mg/dl or >249 mg/dl Time Vitals Blood Respiratory Capillary Blood Glucose Pulse Action Type: Pulse: Temperature: Taken: Pressure: Rate: Glucose (mg/dl): Meter #: Oximetry (%) Taken: Pre 08:06 131/66 99 18 98.6 173 Post 10:03 134/85 96 16 98.8 144 Treatment Response Treatment Toleration: Well Treatment Completion Status: Treatment Completed without Adverse Event Additional Procedure Documentation Tissue  Sevierity: Necrosis of bone Physician HBO Attestation: I certify that I supervised this HBO treatment in accordance with Medicare guidelines. A trained emergency response team is readily available per Yes hospital policies and procedures. Continue HBOT as ordered. Yes Electronic Signature(s) Signed: 11/04/2021 1:28:24 PM By: [name] DO Previous Signature: 11/04/2021 11:18:28 AM Version By: [name] EMT Entered By: [name] on 11/04/2021 15:56:23 [name], [name] ([id number]) [id number] Page 2 of 2 -------------------------------------------------------------------------------- HBO Safety Checklist Details Patient Name: Date of Service: [name], [name] 11/04/2021 8:00 [name] Medical Record Number: [id number] Patient Account Number: [id number] Date of Birth/Sex: Treating RN: January 06, 1971 (51 y.o. [name] [name]: [name] Other Clinician: [name] Referring [name]: Treating Asa Baudoin/Extender: [name] in Treatment: 17 HBO Safety Checklist Items Safety Checklist Consent Form Signed Patient voided / foley secured and emptied When did you last eato 0700 Last dose of injectable or oral agent 0705 Ostomy pouch emptied and vented if applicable NA All implantable devices assessed, documented and approved NA Intravenous access site secured and place NA Valuables secured Linens and cotton and cotton/polyester blend (less than 51% polyester) Personal oil-based products / skin lotions / body lotions removed Wigs or hairpieces removed NA Smoking or tobacco materials removed Books / newspapers / magazines / loose paper removed Cologne, aftershave, perfume and deodorant removed Jewelry removed (may wrap wedding band) NA Make-up removed NA Hair care products removed Battery operated devices (external) removed Heating patches and chemical warmers removed Titanium eyewear  removed NA Nail polish cured greater than 10 hours NA Casting material cured greater than 10 hours NA Hearing aids removed NA Loose dentures or partials removed NA Prosthetics have been removed NA Patient demonstrates correct use of air break device (if applicable) Patient concerns have been addressed Patient grounding bracelet on and cord attached to chamber  Specifics for Inpatients (complete in addition to above) Medication sheet sent with patient NA Intravenous medications needed or due during therapy sent with patient NA Drainage tubes (e.g. nasogastric tube or chest tube secured and vented) NA Endotracheal or Tracheotomy tube secured NA Cuff deflated of air and inflated with saline NA Airway suctioned NA Notes The safety checklist was done before the treatment was started. Electronic Signature(s) Signed: 11/04/2021 11:17:18 AM By: [name] EMT Entered By: [name] on 11/04/2021 14:17:18

## 2021-11-04 NOTE — Progress Notes (Signed)
[name], [name] ([id number]) [id number] Page 1 of 2 Visit Report for 11/04/2021 Arrival Information Details Patient Name: Date of Service: [name], [name] [place] [name] 11/04/2021 8:00 [name] Medical Record Number: [id number] Patient Account Number: [id number] Date of Birth/Sex: Treating RN: [birth year] [name], [place] Primary Care Charlee Squibb: [name] Other Clinician: [name] Referring Ryle Buscemi: Treating Celise Bazar/Extender: [name]: 40 Visit Information History Since Last Visit All ordered tests and consults were completed: Yes Patient Arrived: Ambulatory Added or deleted any medications: No Arrival Time: 13:53 Any new allergies or adverse reactions: No Accompanied By: None Had a fall or experienced change [name] No Transfer Assistance: None activities of daily living that may affect Patient Identification Verified: Yes risk of falls: Secondary Verification Process Completed: Yes Signs or symptoms of abuse/neglect since last visito No Patient Requires Transmission-Based Precautions: No Hospitalized since last visit: No Patient Has Alerts: Yes Implantable device outside of the clinic excluding No cellular tissue based products placed [name] the center since last visit: Pain Present Now: No Electronic Signature(s) Signed: 11/04/2021 11:11:50 AM By: [name] EMT Entered By: [name] on 11/04/2021 14:11:50 -------------------------------------------------------------------------------- Encounter Discharge Information Details Patient Name: Date of Service: [name], [name] 11/04/2021 8:00 [name] Medical Record Number: [id number] Patient Account Number: [id number] Date of Birth/Sex: Treating RN: [birth year] [name] Primary Care Kaycen Whitworth: [name] Other Clinician: [name] Referring Suraya Vidrine: Treating Ahnya Akre/Extender: [name]  Treatment: 17 Encounter Discharge Information Items Discharge Condition: Stable Ambulatory Status: Ambulatory Discharge Destination: Home Transportation: Private Auto Accompanied By: None Schedule Follow-up Appointment: Yes Clinical Summary of Care: Electronic Signature(s) Signed: 11/04/2021 11:20:29 AM By: [name] EMT Entered By: [name] on 11/04/2021 14:20:29 [name], [name] ([id number]) [id number] Page 2 of 2 -------------------------------------------------------------------------------- Vitals Details Patient Name: Date of Service: [name], [name] 11/04/2021 8:00 [name] Medical Record Number: [id number] Patient Account Number: [id number] Date of Birth/Sex: Treating RN: 1970/02/02 [name] Primary Care Mckynzie Liwanag: [name] Other Clinician: [name] Referring Syrenity Klepacki: Treating Perseus Westall/Extender: [name]7 Vital Signs Time Taken: 08:06 Temperature (F): 98.6 Height ([name]: 72 Pulse (bpm): 99 Weight (lbs): 312 Respiratory Rate (breaths/min): 18 Body Mass Index (BMI): 42.3 Blood Pressure (mmHg): 131/66 Capillary Blood Glucose (mg/dl): 173 Reference Range: 80 - 120 mg / dl Electronic Signature(s) Signed: 11/04/2021 11:13:10 AM By: [name] EMT Entered By: [name] on 11/04/2021 14:13:10

## 2021-11-04 NOTE — Progress Notes (Signed)
[name], [name] ([id number]) [id number] Page 1 of 2 Visit Report for 11/01/2021 Problem List Details Patient Name: Date of Service: [name], [name] 11/01/2021 8:00 [name] Medical Record Number: [id number] Patient Account Number: [id number] Date of Birth/Sex: Treating RN: 1970-05-02 (51 y.o. 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 11/01/2021 Medical Record Number: [id number] Patient Account Number: [id number] Date of Birth/Sex: Treating RN: 08-06-1970 (51 y.o. 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 O84.166 Other chronic osteomyelitis, right ankle and foot L97.514 Non-pressure chronic ulcer of other part of right foot with necrosis of bone [name], [name] ([id number]) [id number] Page 2 of  2 Facility Procedures : CPT4 Code Description: [id number] G0277-(Facility Use Only) HBOT full body chamber, 6mn , ICD-10 Diagnosis Description E11.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 L97.514 Non-pressure chronic ulcer of other part of right foo M86.671  Other chronic osteomyelitis, right ankle and foot Modifier: ical complication t with necrosis o Quantity: 4 f bone Physician Procedures : CPT4 Code Description Modifier [id number] 70623- WC PHYS HYPERBARIC OXYGEN THERAPY ICD-10 Diagnosis Description 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

## 2021-11-04 NOTE — Progress Notes (Signed)
[name], [place] C ([id number]) [id number] Page 1 of 2 Visit Report for 11/04/2021 Problem List Details Patient Name: Date of Service: [name], [name] 11/04/2021 8:00 A M Medical Record Number: [id number] Patient Account Number: [id number] Date of Birth/Sex: Treating RN: September 01, 1970 (51 y.o. 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 11/04/2021 Medical Record Number: [id number] Patient Account Number: [id number] Date of Birth/Sex: Treating RN: [birth year] (51 y.o. 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 E99.371 Other chronic osteomyelitis, right ankle and foot L97.514 Non-pressure chronic ulcer of other part of right foot with necrosis of bone [name], Kassem M ([id number]) [id number] Page 2 of  2 Facility Procedures : CPT4 Code Description: [id number] G0277-(Facility Use Only) HBOT full body chamber, 83mn , ICD-10 Diagnosis Description 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 L97.514 Non-pressure chronic ulcer of other part of right foo M86.671  Other chronic osteomyelitis, right ankle and foot Modifier: ical complication t with necrosis o Quantity: 4 f bone Physician Procedures : CPT4 Code Description Modifier [id number] 42353- WC PHYS HYPERBARIC OXYGEN THERAPY ICD-10 Diagnosis Description 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

## 2021-11-05 ENCOUNTER — Encounter (HOSPITAL_BASED_OUTPATIENT_CLINIC_OR_DEPARTMENT_OTHER): Payer: BLUE CROSS/BLUE SHIELD | Admitting: General Surgery

## 2021-11-05 ENCOUNTER — Other Ambulatory Visit: Payer: Self-pay

## 2021-11-05 ENCOUNTER — Ambulatory Visit: Payer: BLUE CROSS/BLUE SHIELD | Admitting: Internal Medicine

## 2021-11-05 ENCOUNTER — Encounter (HOSPITAL_BASED_OUTPATIENT_CLINIC_OR_DEPARTMENT_OTHER): Payer: BLUE CROSS/BLUE SHIELD | Admitting: Internal Medicine

## 2021-11-05 ENCOUNTER — Encounter: Payer: Self-pay | Admitting: Internal Medicine

## 2021-11-05 DIAGNOSIS — E1149 Type 2 diabetes mellitus with other diabetic neurological complication: Secondary | ICD-10-CM

## 2021-11-05 DIAGNOSIS — M86171 Other acute osteomyelitis, right ankle and foot: Secondary | ICD-10-CM | POA: Diagnosis not present

## 2021-11-05 DIAGNOSIS — M86671 Other chronic osteomyelitis, right ankle and foot: Secondary | ICD-10-CM | POA: Diagnosis not present

## 2021-11-05 DIAGNOSIS — L97514 Non-pressure chronic ulcer of other part of right foot with necrosis of bone: Secondary | ICD-10-CM

## 2021-11-05 DIAGNOSIS — E1169 Type 2 diabetes mellitus with other specified complication: Secondary | ICD-10-CM | POA: Diagnosis not present

## 2021-11-05 LAB — GLUCOSE, CAPILLARY
Glucose-Capillary: 211 mg/dL — ABNORMAL HIGH (ref 70–99)
Glucose-Capillary: 219 mg/dL — ABNORMAL HIGH (ref 70–99)

## 2021-11-05 MED ORDER — AMOXICILLIN-POT CLAVULANATE 875-125 MG PO TABS: 1.0000 | ORAL_TABLET | Freq: Two times a day (BID) | ORAL | 0 refills | Status: DC

## 2021-11-05 NOTE — Progress Notes (Signed)
[name], [name] ([id number]) [id number] Page 1 of 2 Visit Report for 11/05/2021 Arrival Information Details Patient Name: Date of Service: [name], [name] 11/05/2021 8:30 [name] Medical Record Number: [id number] Patient Account Number: [id number] Date of Birth/Sex: Treating RN: [birth year] (51 y.o. [name] Primary Care [name]: [name] Other Clinician: Valeria [name] Referring [name]: Treating [name]/Extender: [name] [name]: 23 Visit Information History Since Last Visit All ordered tests and consults were completed: Yes Patient Arrived: Ambulatory Added or deleted any medications: No Arrival Time: 07:47 Any new allergies or adverse reactions: No Accompanied By: None Had a fall or experienced change [name] No Transfer Assistance: None activities of daily living that [name] affect Patient Identification Verified: Yes risk of falls: Secondary Verification Process Completed: Yes Signs or symptoms of abuse/neglect since last visito No Patient Requires Transmission-Based Precautions: No Hospitalized since last visit: No Patient Has Alerts: Yes Implantable device outside of the clinic excluding No cellular tissue based products placed [name] the center since last visit: Pain Present Now: No Electronic Signature(s) Signed: 11/05/2021 1:56:41 PM By: [name] EMT Entered By: [name] on 11/05/2021 13:56:41 -------------------------------------------------------------------------------- Encounter Discharge Information Details Patient Name: Date of Service: [name], [name] 11/05/2021 8:30 [name] Medical Record Number: [id number] Patient Account Number: [id number] Date of Birth/Sex: Treating RN: Feb 13, 1970 (51 y.o. [name] Primary Care [name]: [name] Other Clinician: Valeria [name] Referring Tashawn Laswell: Treating [name]/Extender: [name] [name]  Treatment: 17 Encounter Discharge Information Items Discharge Condition: Stable Ambulatory Status: Ambulatory Discharge Destination: Home Transportation: Private Auto Accompanied By: None Schedule Follow-up Appointment: Yes Clinical Summary of Care: Electronic Signature(s) Signed: 11/05/2021 2:01:07 PM By: [name] EMT Entered By: [name] on 11/05/2021 14:01:07 [name], [name] ([id number]) [id number] Page 2 of 2 -------------------------------------------------------------------------------- Vitals Details Patient Name: Date of Service: [name], [name] 11/05/2021 8:30 [name] Medical Record Number: [id number] Patient Account Number: [id number] Date of Birth/Sex: Treating RN: [birth year] (51 y.o. [name] Primary Care [name]: [name] Other Clinician: Valeria [name] Referring [name]: Treating [name]/Extender: [name] [name]7 Vital Signs Time Taken: 08:00 Temperature (F): 98.6 Height ([name]: 72 Pulse (bpm): 112 Weight (lbs): 312 Respiratory Rate (breaths/min): 20 Body Mass Index (BMI): 42.3 Blood Pressure (mmHg): 143/81 Reference Range: 80 - 120 mg / dl Notes The patient had a Wound Care Clinic visit before his treatment today. Electronic Signature(s) Signed: 11/05/2021 1:57:35 PM By: [name] EMT Entered By: [name] on 11/05/2021 13:57:35

## 2021-11-05 NOTE — Progress Notes (Signed)
[name]  CHIEF COMPLAINT:    Follow up for osteomyelitis  SUBJECTIVE:    [name] is a 51 y.o. male with PMHx as below who presents to the clinic for osteomyelitis.   Patient here today for routine follow up.    Last seen 10/01/21 for chronic diabetic foot ulcer complicated by worsened osteomyelitis after MRI on 07/18/21 showed fairly extensive involvement of his foot.    We discussed limb salvage vs amputation and he wanted to do what he could to save his limb.  PICC line placed 08/09/21 and started on Cefepime and Daptomycin per pharmacy recs which was completed on 09/20/21 (6 weeks). He has continued to follow closely with wound care.   After his PICC line was removed on 09/20/21 he began a course of linezolid and levaquin for "mop up" therapy.  He is tolerated antibiotics without issues.  He is pleased with the progress his wound is making. He has been having regular lab work to monitor while on Linezolid and this has been overall stable with platelets but has noted a drop in hemoglobin on last labs done 9/28.  He finished linezolid and levaquin on Sunday.  Notes from 10/29/21 indicate a portion of the wound remains a couple cm deep.  He saw wound care today and they remain pleased with his progress.  Please see A&P for the details of today's visit and status of the patient's medical problems.   Patient's Medications  New Prescriptions   AMOXICILLIN-CLAVULANATE (AUGMENTIN) 875-125 MG TABLET    Take 1 tablet by mouth 2 (two) times daily.  Previous Medications   ASCORBIC ACID (VITAMIN C) 500 MG TABLET    Take 500 mg by mouth daily.   ASPIRIN EC 81 MG TABLET    Take 81 mg by mouth daily.   ATORVASTATIN (LIPITOR) 80 MG TABLET    Take 1 tablet (80 mg total) by mouth daily.   CYANOCOBALAMIN (VITAMIN B-12) 5000 MCG SUBL    Place 5,000 mcg under the tongue daily.   FENOFIBRATE (TRICOR) 48 MG TABLET    TAKE 1 TABLET BY MOUTH EVERY DAY    GLUCOSE BLOOD (ACCU-CHEK GUIDE) TEST STRIP    Dx E11.49 - Use to check blood glucose three times daily or as directed.   HYDROCHLOROTHIAZIDE (MICROZIDE) 12.5 MG CAPSULE    TAKE 1 CAPSULE BY MOUTH EVERY DAY IN THE MORNING   INSULIN ASPART (NOVOLOG) 100 UNIT/ML FLEXPEN    Inject 15-25 Units into the skin 3 (three) times daily with meals. Please provide pen needles if possible   INSULIN DEGLUDEC (TRESIBA FLEXTOUCH) 200 UNIT/ML FLEXTOUCH PEN    Inject 80-90 Units into the skin daily.   INSULIN SYRINGE-NEEDLE U-100 (BD INSULIN SYRINGE U/F) 31G X 5/16" 0.5 ML MISC    Use to inject insulin 4 times daily. DX:E11.9   INSULIN SYRINGE-NEEDLE U-100 (BD INSULIN SYRINGE U/F) 31G X 5/16" 1 ML MISC    Use to inject tresiba/long acting insulin once daily   INSULIN SYRINGE-NEEDLE U-100 (BD VEO INSULIN SYR U/F 1/2UNIT) 31G X 15/64" 0.3 ML MISC    USE TO INJECT INSULIN FOUR TIMES DAILY AS DIRECTED   JANUVIA 50 MG TABLET    TAKE 1 TABLET BY MOUTH EVERY DAY IN THE MORNING   LANCETS (ONETOUCH ULTRASOFT) LANCETS    Use to test blood sugars daily. Dx: E11.9   LANCETS MISC    USED TO CHECK BLOOD GLUCOSE 3-4 TIMES DAILY  MULTIPLE VITAMIN (MULTIVITAMIN WITH MINERALS) TABS TABLET    Take 1 tablet by mouth 2 (two) times daily.   OMEGA-3 FATTY ACIDS (FISH OIL) 1360 MG CAPS    Take 1,360 mg by mouth daily.    PYRIDOXINE (B-6) 100 MG TABLET    Take 100 mg by mouth 2 (two) times daily.   RAMIPRIL (ALTACE) 5 MG CAPSULE    TAKE 1 CAPSULE BY MOUTH EVERY DAY IN THE MORNING  Modified Medications   No medications on file  Discontinued Medications   No medications on file      Past Medical History:  Diagnosis Date   Cellulitis and abscess of foot    05/ 2021 left   CKD (chronic kidney disease), stage III ([place]) 06-17-2019 per pt last visit 05-29-2016 (note in care everywhere)  currently followed by pcp   nephrologist-  dr [name] ([facility] in [place])     Diabetic peripheral neuropathy ([place])    Elevated LFTs     followed by pcp   Hypertension    followed by pcp---- (06-17-2019 pt stated never had a stress test)   Iron deficiency anemia    Mixed hyperlipidemia    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   Prolactinoma, 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   (06-17-2019 pt stated has been stable with no changes and followed by pcp)   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[place])    11-06-2016  lower extremity angiography (dr [name])  mil tibial disease w/ occlusion of the distal peroneal artery and dorsalis pedis artery but has widely patent posterior tibial and anterior tibial arterties   Type 2 diabetes mellitus treated with insulin ([place])    followed by pcp   (06-17-2019  pt stated checks blood sugar daily in am,  fasting sugar-- average 71)   Vitamin B 12 deficiency     Social History   Tobacco Use   Smoking status: Never   Smokeless tobacco: Never  Vaping Use   Vaping Use: Never used  Substance Use Topics   Alcohol use: No   Drug use: Never    Family History  Problem Relation Age of Onset   Sarcoidosis Mother    Stroke Father        early 43s. former smoker   Diabetes Father    Hypertension Father    Hypertension Sister    Diabetes Paternal Grandmother    Congenital heart disease Paternal Grandmother        died of complications   Healthy Daughter    Colon cancer Neg Hx    Colon polyps Neg Hx    Esophageal cancer Neg Hx    Rectal cancer Neg Hx    Stomach cancer Neg Hx     No Known Allergies  Review of Systems  All other systems reviewed and are negative.    OBJECTIVE:    Vitals:   11/05/21 1051  BP: 128/83  Pulse: 99  Temp: 98.2 F (36.8 C)  TempSrc: Oral  SpO2: 97%   There is no height or weight on file to calculate BMI.  Physical Exam Constitutional:      Appearance: Normal appearance.  Pulmonary:     Effort: Pulmonary effort is normal. No respiratory distress.  Musculoskeletal:        General: Normal range of motion.  Skin:    General: Skin is warm and dry.  Neurological:      General: No focal deficit present.     Mental Status: He is alert and oriented to  person, place, and time.  Psychiatric:        Mood and Affect: Mood normal.        Behavior: Behavior normal.      Labs and Microbiology:    Latest Ref Rng & Units 10/17/2021   10:29 AM 10/07/2021   10:44 AM 10/01/2021   11:04 AM  CBC  WBC 3.8 - 10.8 Thousand/uL 7.6  9.6  7.8   Hemoglobin 13.2 - 17.1 g/dL 9.4  12.2  12.4   Hematocrit 38.5 - 50.0 % 29.3  36.9  38.2   Platelets 140 - 400 Thousand/uL 185  157  177       Latest Ref Rng & Units 10/07/2021   10:44 AM 10/01/2021   11:04 AM 08/06/2021    4:02 PM  CMP  Glucose 65 - 99 mg/dL 100  108  211   BUN 7 - 25 mg/dL _0 Creatinine 0.70 - 1.30 mg/dL 1.46  1.48  1.31   Sodium 135 - 146 mmol/L 139  139  136   Potassium 3.5 - 5.3 mmol/L 4.0  4.3  4.1   Chloride 98 - 110 mmol/L 103  104  101   CO2 20 - 32 mmol/L _1 Calcium 8.6 - 10.3 mg/dL 10.0  10.1  9.6   Total Protein 6.1 - 8.1 g/dL 8.3  8.1  8.2   Total Bilirubin 0.2 - 1.2 mg/dL 1.0  1.0  0.5   AST 10 - 35 U/L 26  34  24   ALT 9 - 46 U/L 44  64  42       ASSESSMENT & PLAN:    Osteomyelitis of ankle or foot, acute, right (HCC) Patient has now completed 12 weeks of antibiotic therapy for his ulcer complicated by osteomyelitis with 6 weeks of IV daptomycin/cefepime followed by 6 further weeks of linezolid and levaquin.  He has wanted to be aggressive in limb salvage and as such the antibiotic regimen has also been aggressive.  His wound has improved and inflammatory markers last month had normalized.  At this time, I am not sure further antibiotics will yield much benefit and further continuation may provide greater risk than any benefit.  Discussed this with patient today regarding stopping antibiotics and continuing with regular wound care vs adding empiric suppressive antibiotic coverage that may or may not provide any benefit.  He is agreeable to continuing suppressive therapy.   Will plan for Augmentin 875 BID x 6 weeks for now.  Check labs today.  Of note, this will not target prior bacteria from cultures but I think this is okay as those cultures were greater than 12 months ago.     Orders Placed This Encounter  Procedures   CBC w/Diff   Comp Met (CMET)   Sedimentation rate   C-reactive protein       [facility] 11/05/2021, 11:10 AM  I have personally spent 30 minutes involved in face-to-face and non-face-to-face activities for this patient on the day of the visit. Professional time spent includes the following activities: Preparing to see the patient (review of tests), Obtaining and/or reviewing separately obtained history (admission/discharge record), Performing a medically appropriate examination and/or evaluation , Ordering medications/tests/procedures, referring and communicating with other health care professionals, Documenting clinical information in the EMR, Independently interpreting results (not separately reported), Communicating results to the patient/family/caregiver, Counseling and educating the  patient/family/caregiver and Care coordination (not separately reported).

## 2021-11-05 NOTE — Progress Notes (Signed)
[name], [name] ([id number]) [id number] Page 1 of 2 Visit Report for 11/05/2021 Problem List Details Patient Name: Date of Service: [name], [name] 11/05/2021 8:30 A M Medical Record Number: [id number] Patient Account Number: [id number] Date of Birth/Sex: Treating RN: Apr 08, 1970 (51 y.o. 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 11/05/2021 Medical Record Number: [id number] Patient Account Number: [id number] Date of Birth/Sex: Treating RN: 04-29-1970 (51 y.o. [name] Primary Care Provider: [name] Other Clinician: [name] Referring Provider: Treating Provider/Extender: [name]7 Diagnosis Coding ICD-10 Codes Code Description E11.49 Type 2 diabetes mellitus with other diabetic neurological complication V36.122 Other chronic osteomyelitis, right ankle and foot L97.514 Non-pressure chronic ulcer of other part of right foot with necrosis of bone [name], Dallen M ([id number]) [id number] Page 2 of  2 Facility Procedures : CPT4 Code Description: [id number] G0277-(Facility Use Only) HBOT full body chamber, 22mn , ICD-10 Diagnosis Description E11.49 Type 2 diabetes mellitus with other diabetic neurolog L97.514 Non-pressure chronic ulcer of other part of right foo M86.671  Other chronic osteomyelitis, right ankle and foot Modifier: ical complication t with necrosis o Quantity: 4 f bone Physician Procedures : CPT4 Code Description Modifier [id number] 10301- WC PHYS HYPERBARIC OXYGEN THERAPY ICD-10 Diagnosis Description E11.49 Type 2 diabetes mellitus with other diabetic neurological complication LT14.388Non-pressure chronic ulcer of other part of right foot  with necrosis o M86.671 Other chronic osteomyelitis, right ankle and foot Quantity: 1 f bone Electronic Signature(s) Signed: 11/05/2021 2:00:34 PM By: GValeria BatmanEMT Signed: 11/05/2021 4:33:46 PM By: HKalman ShanDO Entered By: GValeria Batmanon 11/05/2021 14:00:33

## 2021-11-05 NOTE — Progress Notes (Addendum)
[name], Te[place] ([id number]) [id number] Page 1 of 2 Visit Report for 11/05/2021 HBO Details Patient Name: Date of Service: [name], [place] [name] 11/05/2021 8:30 A M Medical Record Number: [id number] Patient Account Number: [id number] Date of Birth/Sex: Treating RN: [birth year] (51 y.o. [name] Primary Care Shellie Goettl: [name] Other Clinician: [name] Referring Irl Bodie: Treating Lucilla Petrenko/Extender: [name] in Treatment: 17 HBO Treatment Course Details Treatment Course Number: 1 Ordering Jp Eastham: [name] T Treatments Ordered: otal 80 HBO Treatment Start Date: 08/14/2021 HBO Indication: Diabetic Ulcer(s) of the Lower Extremity Standard/Conservative Wound Care tried and failed greater than or equal to 30 days HBO Treatment Details Treatment Number: 59 Patient Type: Outpatient Chamber Type: Monoplace Chamber Serial #: [id number] Treatment Protocol: 2.0 ATA with 90 minutes oxygen, and no air breaks Treatment Details Compression Rate Down: 2.0 psi / minute De-Compression Rate Up: 2.0 psi / minute Air breaks and breathing Decompress Decompress Compress Tx Pressure Begins Reached periods Begins Ends (leave unused spaces blank) Chamber Pressure (ATA 1 2 ------2 1 ) Clock Time (24 hr) 08:29 09:37 - - - - - - 10:07 10:15 Treatment Length: 106 (minutes) Treatment Segments: 4 Vital Signs Capillary Blood Glucose Reference Range: 80 - 120 mg / dl HBO Diabetic Blood Glucose Intervention Range: <131 mg/dl or >249 mg/dl Time Vitals Blood Respiratory Capillary Blood Glucose Pulse Action Type: Pulse: Temperature: Taken: Pressure: Rate: Glucose (mg/dl): Meter #: Oximetry (%) Taken: Pre 08:00 143/81 112 20 98.6 Post 10:21 136/83 87 18 97.3 219 Pre 08:22 211 Treatment Response Treatment Toleration: Well Treatment Completion Status: Treatment Completed without Adverse Event Additional Procedure  Documentation Tissue Sevierity: Necrosis of bone Physician HBO Attestation: I certify that I supervised this HBO treatment in accordance with Medicare guidelines. A trained emergency response team is readily available per Yes hospital policies and procedures. Continue HBOT as ordered. Yes Electronic Signature(s) Signed: 11/05/2021 4:33:46 PM By: [name] DO Previous Signature: 11/05/2021 2:00:09 PM Version By: [name] EMT Entered By: [name] on 11/05/2021 16:29:33 [name], [name] ([id number]) [id number] Page 2 of 2 -------------------------------------------------------------------------------- HBO Safety Checklist Details Patient Name: Date of Service: [name], [name] 11/05/2021 8:30 A M Medical Record Number: [id number] Patient Account Number: [id number] Date of Birth/Sex: Treating RN: [birth year] (50 y.o. [name] Primary Care Dim Meisinger: [name] Other Clinician: [name] Referring Natane Heward: Treating Naryiah Schley/Extender: [name] in Treatment: 17 HBO Safety Checklist Items Safety Checklist Consent Form Signed Patient voided / foley secured and emptied When did you last eato 0700 Last dose of injectable or oral agent 0705 Ostomy pouch emptied and vented if applicable NA All implantable devices assessed, documented and approved NA Intravenous access site secured and place NA Valuables secured Linens and cotton and cotton/polyester blend (less than 51% polyester) Personal oil-based products / skin lotions / body lotions removed Wigs or hairpieces removed NA Smoking or tobacco materials removed Books / newspapers / magazines / loose paper removed Cologne, aftershave, perfume and deodorant removed Jewelry removed (may wrap wedding band) NA Make-up removed NA Hair care products removed Battery operated devices (external) removed Heating patches and chemical warmers  removed Titanium eyewear removed NA Nail polish cured greater than 10 hours NA Casting material cured greater than 10 hours NA Hearing aids removed NA Loose dentures or partials removed NA Prosthetics have been removed NA Patient demonstrates correct use of air break device (if applicable) Patient concerns have been addressed Patient grounding bracelet on and cord attached  to chamber Specifics for Inpatients (complete in addition to above) Medication sheet sent with patient NA Intravenous medications needed or due during therapy sent with patient NA Drainage tubes (e.g. nasogastric tube or chest tube secured and vented) NA Endotracheal or Tracheotomy tube secured NA Cuff deflated of air and inflated with saline NA Airway suctioned NA Notes The safety checklist was done before the treatment was started. Electronic Signature(s) Signed: 11/05/2021 1:58:35 PM By: [name] EMT Entered By: [name] on 11/05/2021 13:58:34

## 2021-11-05 NOTE — Assessment & Plan Note (Signed)
Patient has now completed 12 weeks of antibiotic therapy for his ulcer complicated by osteomyelitis with 6 weeks of IV daptomycin/cefepime followed by 6 further weeks of linezolid and levaquin.  He has wanted to be aggressive in limb salvage and as such the antibiotic regimen has also been aggressive.  His wound has improved and inflammatory markers last month had normalized.  At this time, I am not sure further antibiotics will yield much benefit and further continuation may provide greater risk than any benefit.  Discussed this with patient today regarding stopping antibiotics and continuing with regular wound care vs adding empiric suppressive antibiotic coverage that may or may not provide any benefit.  He is agreeable to continuing suppressive therapy.  Will plan for Augmentin 875 BID x 6 weeks for now.  Check labs today.  Of note, this will not target prior bacteria from cultures but I think this is okay as those cultures were greater than 12 months ago.

## 2021-11-06 ENCOUNTER — Telehealth: Payer: Self-pay

## 2021-11-06 ENCOUNTER — Encounter (HOSPITAL_BASED_OUTPATIENT_CLINIC_OR_DEPARTMENT_OTHER): Payer: BLUE CROSS/BLUE SHIELD | Admitting: General Surgery

## 2021-11-06 ENCOUNTER — Ambulatory Visit: Payer: BLUE CROSS/BLUE SHIELD | Admitting: Internal Medicine

## 2021-11-06 ENCOUNTER — Observation Stay (HOSPITAL_COMMUNITY)
Admission: EM | Admit: 2021-11-06 | Discharge: 2021-11-08 | Disposition: A | Discharge status: Home / Self Care | Payer: BLUE CROSS/BLUE SHIELD | Attending: Family Medicine | Admitting: Family Medicine

## 2021-11-06 ENCOUNTER — Encounter (HOSPITAL_COMMUNITY): Payer: Self-pay

## 2021-11-06 ENCOUNTER — Other Ambulatory Visit: Payer: Self-pay

## 2021-11-06 DIAGNOSIS — D509 Iron deficiency anemia, unspecified: Secondary | ICD-10-CM

## 2021-11-06 DIAGNOSIS — R799 Abnormal finding of blood chemistry, unspecified: Secondary | ICD-10-CM | POA: Diagnosis not present

## 2021-11-06 DIAGNOSIS — Z7984 Long term (current) use of oral hypoglycemic drugs: Secondary | ICD-10-CM | POA: Diagnosis not present

## 2021-11-06 DIAGNOSIS — Z794 Long term (current) use of insulin: Secondary | ICD-10-CM | POA: Diagnosis not present

## 2021-11-06 DIAGNOSIS — M869 Osteomyelitis, unspecified: Secondary | ICD-10-CM | POA: Diagnosis present

## 2021-11-06 DIAGNOSIS — I129 Hypertensive chronic kidney disease with stage 1 through stage 4 chronic kidney disease, or unspecified chronic kidney disease: Secondary | ICD-10-CM | POA: Insufficient documentation

## 2021-11-06 DIAGNOSIS — I739 Peripheral vascular disease, unspecified: Secondary | ICD-10-CM

## 2021-11-06 DIAGNOSIS — Z79899 Other long term (current) drug therapy: Secondary | ICD-10-CM | POA: Diagnosis not present

## 2021-11-06 DIAGNOSIS — K921 Melena: Secondary | ICD-10-CM | POA: Diagnosis not present

## 2021-11-06 DIAGNOSIS — E11621 Type 2 diabetes mellitus with foot ulcer: Secondary | ICD-10-CM | POA: Diagnosis present

## 2021-11-06 DIAGNOSIS — D649 Anemia, unspecified: Secondary | ICD-10-CM | POA: Diagnosis not present

## 2021-11-06 DIAGNOSIS — L97514 Non-pressure chronic ulcer of other part of right foot with necrosis of bone: Secondary | ICD-10-CM

## 2021-11-06 DIAGNOSIS — N182 Chronic kidney disease, stage 2 (mild): Secondary | ICD-10-CM | POA: Diagnosis present

## 2021-11-06 DIAGNOSIS — E1169 Type 2 diabetes mellitus with other specified complication: Secondary | ICD-10-CM | POA: Diagnosis present

## 2021-11-06 DIAGNOSIS — D352 Benign neoplasm of pituitary gland: Secondary | ICD-10-CM

## 2021-11-06 DIAGNOSIS — Z23 Encounter for immunization: Secondary | ICD-10-CM | POA: Diagnosis not present

## 2021-11-06 DIAGNOSIS — Z7982 Long term (current) use of aspirin: Secondary | ICD-10-CM | POA: Insufficient documentation

## 2021-11-06 DIAGNOSIS — E1140 Type 2 diabetes mellitus with diabetic neuropathy, unspecified: Secondary | ICD-10-CM | POA: Diagnosis present

## 2021-11-06 DIAGNOSIS — E1122 Type 2 diabetes mellitus with diabetic chronic kidney disease: Secondary | ICD-10-CM | POA: Insufficient documentation

## 2021-11-06 DIAGNOSIS — I10 Essential (primary) hypertension: Secondary | ICD-10-CM | POA: Diagnosis present

## 2021-11-06 DIAGNOSIS — D62 Acute posthemorrhagic anemia: Principal | ICD-10-CM | POA: Insufficient documentation

## 2021-11-06 DIAGNOSIS — E1149 Type 2 diabetes mellitus with other diabetic neurological complication: Secondary | ICD-10-CM | POA: Diagnosis present

## 2021-11-06 DIAGNOSIS — N1831 Chronic kidney disease, stage 3a: Secondary | ICD-10-CM | POA: Insufficient documentation

## 2021-11-06 LAB — VITAMIN B12: Vitamin B-12: 773 pg/mL (ref 180–914)

## 2021-11-06 LAB — CBC WITH DIFFERENTIAL/PLATELET
Absolute Monocytes: 460 cells/uL (ref 200–950)
Basophils Absolute: 13 cells/uL (ref 0–200)
Basophils Relative: 0.2 %
Eosinophils Absolute: 233 cells/uL (ref 15–500)
Eosinophils Relative: 3.7 %
HCT: 20.7 % — ABNORMAL LOW (ref 38.5–50.0)
Hemoglobin: 6.7 g/dL — ABNORMAL LOW (ref 13.2–17.1)
Lymphs Abs: 2205 cells/uL (ref 850–3900)
MCH: 25.8 pg — ABNORMAL LOW (ref 27.0–33.0)
MCHC: 32.4 g/dL (ref 32.0–36.0)
MCV: 79.6 fL — ABNORMAL LOW (ref 80.0–100.0)
MPV: 9.5 fL (ref 7.5–12.5)
Monocytes Relative: 7.3 %
Neutro Abs: 3389 cells/uL (ref 1500–7800)
Neutrophils Relative %: 53.8 %
Platelets: 248 10*3/uL (ref 140–400)
RBC: 2.6 10*6/uL — ABNORMAL LOW (ref 4.20–5.80)
RDW: 13.1 % (ref 11.0–15.0)
Total Lymphocyte: 35 %
WBC: 6.3 10*3/uL (ref 3.8–10.8)

## 2021-11-06 LAB — COMPREHENSIVE METABOLIC PANEL
AG Ratio: 1.5 (calc) (ref 1.0–2.5)
ALT: 18 U/L (ref 9–46)
ALT: 24 U/L (ref 0–44)
AST: 14 U/L (ref 10–35)
AST: 19 U/L (ref 15–41)
Albumin: 4.2 g/dL (ref 3.6–5.1)
Albumin: 3.8 g/dL (ref 3.5–5.0)
Alkaline Phosphatase: 51 U/L (ref 38–126)
Alkaline phosphatase (APISO): 53 U/L (ref 35–144)
Anion gap: 5 (ref 5–15)
BUN/Creatinine Ratio: 16 (calc) (ref 6–22)
BUN: 27 mg/dL — ABNORMAL HIGH (ref 7–25)
BUN: 23 mg/dL — ABNORMAL HIGH (ref 6–20)
CO2: 28 mmol/L (ref 20–32)
CO2: 27 mmol/L (ref 22–32)
Calcium: 9.6 mg/dL (ref 8.6–10.3)
Calcium: 9.5 mg/dL (ref 8.9–10.3)
Chloride: 108 mmol/L (ref 98–110)
Chloride: 110 mmol/L (ref 98–111)
Creat: 1.7 mg/dL — ABNORMAL HIGH (ref 0.70–1.30)
Creatinine, Ser: 1.42 mg/dL — ABNORMAL HIGH (ref 0.61–1.24)
GFR, Estimated: 60 mL/min — ABNORMAL LOW (ref >60)
Globulin: 2.8 g/dL (calc) (ref 1.9–3.7)
Glucose, Bld: 170 mg/dL — ABNORMAL HIGH (ref 65–99)
Glucose, Bld: 143 mg/dL — ABNORMAL HIGH (ref 70–99)
Potassium: 4.2 mmol/L (ref 3.5–5.3)
Potassium: 3.8 mmol/L (ref 3.5–5.1)
Sodium: 142 mmol/L (ref 135–146)
Sodium: 142 mmol/L (ref 135–145)
Total Bilirubin: 0.8 mg/dL (ref 0.2–1.2)
Total Bilirubin: 0.9 mg/dL (ref 0.3–1.2)
Total Protein: 7 g/dL (ref 6.1–8.1)
Total Protein: 7.5 g/dL (ref 6.5–8.1)

## 2021-11-06 LAB — CBC
HCT: 20 % — ABNORMAL LOW (ref 39.0–52.0)
Hemoglobin: 6.4 g/dL — CL (ref 13.0–17.0)
MCH: 25.7 pg — ABNORMAL LOW (ref 26.0–34.0)
MCHC: 32 g/dL (ref 30.0–36.0)
MCV: 80.3 fL (ref 80.0–100.0)
Platelets: 230 10*3/uL (ref 150–400)
RBC: 2.49 MIL/uL — ABNORMAL LOW (ref 4.22–5.81)
RDW: 13.3 % (ref 11.5–15.5)
WBC: 6.9 10*3/uL (ref 4.0–10.5)
nRBC: 0 % (ref 0.0–0.2)

## 2021-11-06 LAB — TROPONIN I (HIGH SENSITIVITY)
Troponin I (High Sensitivity): 8 ng/L (ref <18)
Troponin I (High Sensitivity): 8 ng/L (ref <18)

## 2021-11-06 LAB — GLUCOSE, CAPILLARY
Glucose-Capillary: 187 mg/dL — ABNORMAL HIGH (ref 70–99)
Glucose-Capillary: 185 mg/dL — ABNORMAL HIGH (ref 70–99)
Glucose-Capillary: 108 mg/dL — ABNORMAL HIGH (ref 70–99)

## 2021-11-06 LAB — FERRITIN: Ferritin: 340 ng/mL — ABNORMAL HIGH (ref 24–336)

## 2021-11-06 LAB — RETICULOCYTES
Immature Retic Fract: 0 % — ABNORMAL LOW (ref 2.3–15.9)
RBC.: 2.57 MIL/uL — ABNORMAL LOW (ref 4.22–5.81)
Retic Ct Pct: <0.4 % — ABNORMAL LOW (ref 0.4–3.1)

## 2021-11-06 LAB — IRON AND TIBC
Iron: 110 ug/dL (ref 45–182)
Saturation Ratios: 44 % — ABNORMAL HIGH (ref 17.9–39.5)
TIBC: 251 ug/dL (ref 250–450)
UIBC: 141 ug/dL

## 2021-11-06 LAB — PROTIME-INR
INR: 1.1 (ref 0.8–1.2)
Prothrombin Time: 13.9 seconds (ref 11.4–15.2)

## 2021-11-06 LAB — SEDIMENTATION RATE: Sed Rate: 25 mm/h — ABNORMAL HIGH (ref 0–20)

## 2021-11-06 LAB — ABO/RH: ABO/RH(D): B NEG

## 2021-11-06 LAB — FOLATE: Folate: 7.8 ng/mL (ref >5.9)

## 2021-11-06 LAB — C-REACTIVE PROTEIN: CRP: 7.1 mg/L (ref <8.0)

## 2021-11-06 LAB — PREPARE RBC (CROSSMATCH)

## 2021-11-06 LAB — BRAIN NATRIURETIC PEPTIDE: B Natriuretic Peptide: 91.4 pg/mL (ref 0.0–100.0)

## 2021-11-06 MED ORDER — ACETAMINOPHEN 325 MG PO TABS: 650.0000 mg | ORAL_TABLET | Freq: Four times a day (QID) | ORAL | Status: DC | PRN

## 2021-11-06 MED ORDER — INSULIN DETEMIR 100 UNIT/ML ~~LOC~~ SOLN
60.0000 [IU] | Freq: Every day | SUBCUTANEOUS | Status: DC
  Administered 2021-11-08: 60 [IU] via SUBCUTANEOUS
  Filled 2021-11-06 (×2): qty 0.6

## 2021-11-06 MED ORDER — ONDANSETRON HCL 4 MG PO TABS: 4.0000 mg | ORAL_TABLET | Freq: Four times a day (QID) | ORAL | Status: DC | PRN

## 2021-11-06 MED ORDER — FENOFIBRATE 54 MG PO TABS
54.0000 mg | ORAL_TABLET | Freq: Every day | ORAL | Status: DC
  Administered 2021-11-07 – 2021-11-08 (×2): 54 mg via ORAL
  Filled 2021-11-06 (×2): qty 1

## 2021-11-06 MED ORDER — ACETAMINOPHEN 650 MG RE SUPP: 650.0000 mg | Freq: Four times a day (QID) | RECTAL | Status: DC | PRN

## 2021-11-06 MED ORDER — PANTOPRAZOLE SODIUM 40 MG IV SOLR
40.0000 mg | Freq: Two times a day (BID) | INTRAVENOUS | Status: DC
  Administered 2021-11-07 (×2): 40 mg via INTRAVENOUS
  Filled 2021-11-06 (×2): qty 10

## 2021-11-06 MED ORDER — HYDROCHLOROTHIAZIDE 12.5 MG PO TABS
12.5000 mg | ORAL_TABLET | Freq: Every day | ORAL | Status: DC
  Administered 2021-11-07 – 2021-11-08 (×2): 12.5 mg via ORAL
  Filled 2021-11-06 (×2): qty 1

## 2021-11-06 MED ORDER — ONDANSETRON HCL 4 MG/2ML IJ SOLN: 4.0000 mg | Freq: Four times a day (QID) | INTRAMUSCULAR | Status: DC | PRN

## 2021-11-06 MED ORDER — ATORVASTATIN CALCIUM 40 MG PO TABS
80.0000 mg | ORAL_TABLET | Freq: Every day | ORAL | Status: DC
  Administered 2021-11-07 – 2021-11-08 (×2): 80 mg via ORAL
  Filled 2021-11-06 (×2): qty 2

## 2021-11-06 MED ORDER — AMOXICILLIN-POT CLAVULANATE 875-125 MG PO TABS
1.0000 | ORAL_TABLET | Freq: Two times a day (BID) | ORAL | Status: DC
  Administered 2021-11-06 – 2021-11-08 (×4): 1 via ORAL
  Filled 2021-11-06 (×4): qty 1

## 2021-11-06 MED ORDER — LINAGLIPTIN 5 MG PO TABS
5.0000 mg | ORAL_TABLET | Freq: Every day | ORAL | Status: DC
  Administered 2021-11-07: 5 mg via ORAL
  Filled 2021-11-06 (×2): qty 1

## 2021-11-06 MED ORDER — PANTOPRAZOLE SODIUM 40 MG IV SOLR
40.0000 mg | Freq: Once | INTRAVENOUS | Status: AC
  Administered 2021-11-06: 40 mg via INTRAVENOUS
  Filled 2021-11-06: qty 10

## 2021-11-06 MED ORDER — RAMIPRIL 5 MG PO CAPS
5.0000 mg | ORAL_CAPSULE | Freq: Every day | ORAL | Status: DC
  Administered 2021-11-07 – 2021-11-08 (×2): 5 mg via ORAL
  Filled 2021-11-06 (×2): qty 1

## 2021-11-06 MED ORDER — SODIUM CHLORIDE 0.9 % IV SOLN
10.0000 mL/h | Freq: Once | INTRAVENOUS | Status: AC
  Administered 2021-11-06: 10 mL/h via INTRAVENOUS

## 2021-11-06 MED ORDER — INSULIN ASPART 100 UNIT/ML IJ SOLN
0.0000 [IU] | Freq: Three times a day (TID) | INTRAMUSCULAR | Status: DC
  Filled 2021-11-06: qty 0.2

## 2021-11-06 MED ORDER — VITAMIN B-12 1000 MCG PO TABS
5000.0000 ug | ORAL_TABLET | Freq: Every day | ORAL | Status: DC
  Administered 2021-11-07 – 2021-11-08 (×2): 5000 ug via ORAL
  Filled 2021-11-06 (×2): qty 5

## 2021-11-06 MED ORDER — ASPIRIN 81 MG PO TBEC
81.0000 mg | DELAYED_RELEASE_TABLET | Freq: Every day | ORAL | Status: DC
  Administered 2021-11-07 – 2021-11-08 (×2): 81 mg via ORAL
  Filled 2021-11-06 (×2): qty 1

## 2021-11-06 MED ORDER — VITAMIN B-6 100 MG PO TABS
100.0000 mg | ORAL_TABLET | Freq: Two times a day (BID) | ORAL | Status: DC
  Administered 2021-11-06 – 2021-11-08 (×4): 100 mg via ORAL
  Filled 2021-11-06 (×4): qty 1

## 2021-11-06 NOTE — Progress Notes (Signed)
[name], Zohar I ([id number]) [id number] Page 1 of 2 Visit Report for 11/06/2021 Arrival Information Details Patient Name: Date of Service: [place],  [name] 11/06/2021 8:00 A M Medical Record Number: [id number] Patient Account Number: [id number] Date of Birth/Sex: Treating RN: 12-18-1970 (51 y.o. [name], [place] Primary Care Milad Bublitz: [name] Other Clinician: [name] Referring Delphina Schum: Treating Cathline Dowen/Extender: [name] in Treatment: 61 Visit Information History Since Last Visit All ordered tests and consults were completed: Yes Patient Arrived: Ambulatory Added or deleted any medications: No Arrival Time: 07:56 Any new allergies or adverse reactions: No Accompanied By: None Had a fall or experienced change in No Transfer Assistance: None activities of daily living that may affect Patient Identification Verified: Yes risk of falls: Secondary Verification Process Completed: Yes Signs or symptoms of abuse/neglect since last visito No Patient Requires Transmission-Based Precautions: No Hospitalized since last visit: No Patient Has Alerts: Yes Implantable device outside of the clinic excluding No cellular tissue based products placed in the center since last visit: Pain Present Now: No Electronic Signature(s) Signed: 11/06/2021 1:00:37 PM By: [name] EMT Entered By: [name] on 11/06/2021 13:00:37 -------------------------------------------------------------------------------- Encounter Discharge Information Details Patient Name: Date of Service: [place], [name] 11/06/2021 8:00 A M Medical Record Number: [id number] Patient Account Number: [id number] Date of Birth/Sex: Treating RN: 27-Jan-1970 (51 y.o. [name] Creedence Kunesh: [name] Other Clinician: Blanche East Referring Donald Jacque: Treating Kalasia Crafton/Extender: [name] in Treatment:  17 Encounter Discharge Information Items Discharge Condition: Stable Ambulatory Status: Ambulatory Discharge Destination: Emergency Room Orders Sent: No Transportation: Other Accompanied By: Staff Schedule Follow-up Appointment: Yes Clinical Summary of Care: Notes Report given to ED Triage RN. Electronic Signature(s) Signed: 11/06/2021 1:25:55 PM By: [name] EMT Entered By: [name] on 11/06/2021 13:25:55 [name], Rik M ([id number]) [id number] Page 2 of 2 -------------------------------------------------------------------------------- Vitals Details Patient Name: Date of Service: [name], [name] 11/06/2021 8:00 A M Medical Record Number: [id number] Patient Account Number: [id number] Date of Birth/Sex: Treating RN: Jun 16, 1970 (51 y.o. [name] Brand Siever: [name] Other Clinician: Blanche East Referring Jaivyn Gulla: Treating Merrick Feutz/Extender: [name] in Treatment: 17 Vital Signs Time Taken: 08:10 Temperature (F): 98.2 Height (in): 72 Pulse (bpm): 99 Weight (lbs): 312 Respiratory Rate (breaths/min): 18 Body Mass Index (BMI): 42.3 Blood Pressure (mmHg): 135/89 Capillary Blood Glucose (mg/dl): 187 Reference Range: 80 - 120 mg / dl Electronic Signature(s) Signed: 11/06/2021 1:01:07 PM By: [name] EMT Entered By: [name] on 11/06/2021 13:01:07

## 2021-11-06 NOTE — Telephone Encounter (Signed)
-----   Message from Mignon Pine, DO sent at 11/06/2021  8:04 AM EDT ----- Hi Triage -- Can you please reach out to patient.  Labs yesterday showed his hemoglobin was 6.7 which is quite low and would recommend a blood transfusion.  I think this is from the linezolid antibiotic that he has now finished as of Sunday.    If he is symptomatic at all (was feeling well yesterday) then I would suggest he go to the ED for transfusion.  If he is not symptomatic, then can we arrange for 1 PRBC transfusion at the [facility]?  Thanks, Mitzi Hansen

## 2021-11-06 NOTE — Progress Notes (Signed)
[name], [name] ([id number]) [id number] Page 1 of 2 Visit Report for 11/06/2021 HBO Details Patient Name: Date of Service: [name], [name] 11/06/2021 8:00 [name] Medical Record Number: [id number] Patient Account Number: [id number] Date of Birth/Sex: Treating RN: October 21, 1970 (51 y.o. [name] Ricca Melgarejo: [name] Other Clinician: Blanche East Referring Sheyanne Munley: Treating Ellice Boultinghouse/Extender: [name] in Treatment: 17 HBO Treatment Course Details Treatment Course Number: 1 Ordering Creed Kail: [name] T Treatments Ordered: otal 80 HBO Treatment Start Date: 08/14/2021 HBO Indication: Diabetic Ulcer(s) of the Lower Extremity Standard/Conservative Wound Care tried and failed greater than or equal to 30 days HBO Treatment Details Treatment Number: 60 Patient Type: Outpatient Chamber Type: Monoplace Chamber Serial #: [id number] Treatment Protocol: 2.0 ATA with 90 minutes oxygen, and no air breaks Treatment Details Compression Rate Down: 2.0 psi / minute De-Compression Rate Up: 2.0 psi / minute Air breaks and breathing Decompress Decompress Compress Tx Pressure Begins Reached periods Begins Ends (leave unused spaces blank) Chamber Pressure (ATA 1 2 ------2 1 ) Clock Time (24 hr) 08:15 08:23 - - - - - - 09:53 10:01 Treatment Length: 106 (minutes) Treatment Segments: 4 Vital Signs Capillary Blood Glucose Reference Range: 80 - 120 mg / dl HBO Diabetic Blood Glucose Intervention Range: <131 mg/dl or >249 mg/dl Time Vitals Blood Respiratory Capillary Blood Glucose Pulse Action Type: Pulse: Temperature: Taken: Pressure: Rate: Glucose (mg/dl): Meter #: Oximetry (%) Taken: Pre 08:10 135/89 99 18 98.2 187 Post 10:06 136/97 90 16 98.1 185 Treatment Response Treatment Toleration: Well Treatment Completion Status: Treatment Completed without Adverse Event Treatment Notes Received call from the  patient wife. She stated that she had just gotten a call from Dr. Juleen China stating that his HgB was 6.7, and Dr. Juleen China would like him to go to the Emergency Room. I spoke with [name]. She stated to finish his treatment, and then take him to ED. Additional Procedure Documentation Tissue Sevierity: Necrosis of bone Physician HBO Attestation: I certify that I supervised this HBO treatment in accordance with Medicare guidelines. A trained emergency response team is readily available per [facility] policies and procedures. Continue HBOT as ordered. [address] [name], [name] ([id number]) [id number] Page 2 of 2 Electronic Signature(s) Signed: 11/06/2021 1:30:42 PM By: [name] MD FACS Previous Signature: 11/06/2021 1:23:43 PM Version By: [name] EMT Entered By: [name] on 11/06/2021 13:30:42 -------------------------------------------------------------------------------- HBO Safety Checklist Details Patient Name: Date of Service: [name], [name] 11/06/2021 8:00 [name] Medical Record Number: [id number] Patient Account Number: [id number] Date of Birth/Sex: Treating RN: November 15, 1970 (51 y.o. [name] Levern Pitter: [name] Other Clinician: Blanche East Referring Verginia Toohey: Treating Kacen Mellinger/Extender: [name] in Treatment: 17 HBO Safety Checklist Items Safety Checklist Consent Form Signed Patient voided / foley secured and emptied When did you last eato 0705 Last dose of injectable or oral agent 0700 Ostomy pouch emptied and vented if applicable NA All implantable devices assessed, documented and approved NA Intravenous access site secured and place NA Valuables secured Linens and cotton and cotton/polyester blend (less than 51% polyester) Personal oil-based products / skin lotions / body lotions removed Wigs or hairpieces removed NA Smoking or tobacco materials removed Books /  newspapers / magazines / loose paper removed Cologne, aftershave, perfume and deodorant removed Jewelry removed (may wrap wedding band) NA Make-up removed NA Hair care products removed Battery operated devices (external) removed Heating patches and chemical warmers removed Titanium eyewear removed  NA Nail polish cured greater than 10 hours NA Casting material cured greater than 10 hours NA Hearing aids removed NA Loose dentures or partials removed NA Prosthetics have been removed NA Patient demonstrates correct use of air break device (if applicable) Patient concerns have been addressed Patient grounding bracelet on and cord attached to chamber Specifics for Inpatients (complete in addition to above) Medication sheet sent with patient NA Intravenous medications needed or due during therapy sent with patient NA Drainage tubes (e.g. nasogastric tube or chest tube secured and vented) NA Endotracheal or Tracheotomy tube secured NA Cuff deflated of air and inflated with saline NA Airway suctioned NA Notes The safety checklist was done before the treatment was started. Electronic Signature(s) Signed: 11/06/2021 1:02:09 PM By: [name] EMT Entered By: [name] on 11/06/2021 13:02:09

## 2021-11-06 NOTE — H&P (Signed)
History and Physical    Patient: [name] DGL:[id number] DOB: [birth year] DOA: 11/06/2021 DOS: the patient was seen and examined on 11/06/2021 PCP: Marin Olp, MD  Patient coming from: Home  Chief Complaint:  Chief Complaint  Patient presents with   Abnormal Labs   HPI: [name] is a 51 y.o. 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  The patient stated that he has felt winded over the last 3 weeks, but denied palpitations, dizziness or diaphoresis.  He has noticed intermittent melena, but the patient is on iron supplement.  He denied fever, chills, rhinorrhea, sore throat, wheezing or hemoptysis.  No chest pain, PND, orthopnea or recent pitting edema of the lower extremities.  No abdominal pain, nausea, emesis, constipation or hematochezia.  However, he has noticed that his stools are looser and his bowels noisier since he started antibiotic therapy.  No flank pain, dysuria, frequency or hematuria.  No polyuria, polydipsia, polyphagia or blurred vision.   ED course: Initial vital signs were temperature 98.5 F, pulse 73, respiration 18, BP 179/100 mmHg O2 sat 97% on room air.  The patient was ordered pantoprazole 40 mg IVP x1 dose and a 2 units PRBC transfusion.  Lab work: CBC showed a white count 6.9, hemoglobin 6.4 g/dL platelets 230.  PT was 13.9 INR 1.1.  CMP showed a glucose of 143, BUN 23 and creatinine 1.42 mg/deciliter.   The rest of the CMP measurements were normal.  Review of Systems: As mentioned in the history of present illness. All other systems reviewed and are negative.  Past Medical History:  Diagnosis Date   Cellulitis and abscess of foot    05/ 2021 left   CKD (chronic kidney disease), stage III ([place]) 06-17-2019 per pt last visit 05-29-2016 (note in care everywhere)  currently followed by pcp   nephrologist-  dr [name] ([facility] in [place])     Diabetic peripheral neuropathy ([place])    Elevated LFTs    followed by pcp   Hypertension    followed by pcp---- (06-17-2019 pt stated never had a stress test)   Iron deficiency anemia    Mixed hyperlipidemia    Pituitary microadenoma ([place]) 05/26/2001   Prolactinoma, 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   (06-17-2019 pt stated has been stable with no changes and followed by pcp)   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[place])    11-06-2016  lower extremity angiography (dr [name])  mil tibial disease w/ occlusion of the distal peroneal artery and dorsalis pedis artery but has widely patent posterior tibial and anterior tibial arterties   Type 2 diabetes mellitus treated with insulin ([place])    followed by pcp   (06-17-2019  pt stated checks blood sugar daily in am,  fasting sugar-- average 71)   Vitamin B 12 deficiency    Past Surgical History:  Procedure Laterality Date   AMPUTATION Right 07/31/2016   Procedure: AMPUTATION RIGHT FIFTH RAY AND APPLICATION OF WOUND [place];  Surgeon: Dorna Leitz, MD;  Location: [facility];  Service: Orthopedics;  Laterality: Right;  AMPUTATION Right 08/08/2016   Procedure: RAY AMPUTATION RIGHT FOURTH METATARSAL, DEBRIDEMENT OF SUBQ TISSUE,BONE AND SKIN WITH WOUND CLOSURE;  Surgeon: Dorna Leitz, MD;  Location: [facility];  Service: Orthopedics;  Laterality: Right;   AMPUTATION TOE Left 06/21/2019   Procedure: AMPUTATION TOE SECOND LEFT, DEBRIDEMENT OF LEFT GREAT TOE;  Surgeon: [name], DPM;  Location: [place];   Service: Podiatry;  Laterality: Left;   AMPUTATION TOE Right 09/03/2020   Procedure: AMPUTATION TOE;  Surgeon: [name], DPM;  Location: [facility];  Service: Podiatry;  Laterality: Right;   ANAL FISSURE REPAIR  2637   APPLICATION OF A-CELL OF EXTREMITY Right 08/13/2016   Procedure: APPLICATION OF A-CELL AND VAC;  Surgeon: Wallace Going, DO;  Location: [facility];  Service: Plastics;  Laterality: Right;   APPLICATION OF A-CELL OF EXTREMITY Right 07/16/2017   Procedure: CELLERATE PLACEMENT;  Surgeon: Wallace Going, DO;  Location: [place];  Service: Plastics;  Laterality: Right;   APPLICATION OF WOUND VAC Right 09/18/2016   Procedure: APPLICATION OF WOUND VAC (PT. WILL BRING HIS OWN);  Surgeon: Wallace Going, DO;  Location: [facility];  Service: Plastics;  Laterality: Right;   APPLICATION OF WOUND VAC Right 10/30/2016   Procedure: APPLICATION OF WOUND VAC;  Surgeon: Wallace Going, DO;  Location: [place];  Service: Plastics;  Laterality: Right;   APPLICATION OF WOUND VAC Right 12/01/2016   Procedure: APPLICATION OF WOUND VAC;  Surgeon: Wallace Going, DO;  Location: [facility];  Service: Plastics;  Laterality: Right;   BONE BIOPSY Left 01/04/2018   Procedure: LEFT FOOT BONE BIOPSY;  Surgeon: [name], DPM;  Location: [place];  Service: Podiatry;  Laterality: Left;   BONE BIOPSY Right 09/03/2020   Procedure: BONE BIOPSY;  Surgeon: [name], DPM;  [facility];  Service: Podiatry;  Laterality: Right;   CIRCUMCISION  2006   COLONOSCOPY  04/08/2021   DEBRIDEMENT  FOOT Right 07/16/2017   I & D EXTREMITY Right 09/18/2016   Procedure: IRRIGATION AND DEBRIDEMENT OF RIGHT LATERAL DIABLTIC FOOT ULCER;  Surgeon: Wallace Going, DO;  Location: [place];  Service: Plastics;  Laterality: Right;   I & D EXTREMITY Right 03/11/2017   Procedure: IRRIGATION AND DEBRIDEMENT OF RIGHT LATERAL  FOOT WOUND WITH CELLERATE COLLAGEN;  Surgeon: Wallace Going, DO;  Location: [place];  Service: Plastics;  Laterality: Right;   INCISION AND DRAINAGE OF WOUND Right 08/13/2016   Procedure: IRRIGATION AND DEBRIDEMENT WOUND RIGHT FOOT;  Surgeon: Wallace Going, DO;  [facility];  Service: Plastics;  Laterality: Right;   IR PATIENT EVAL TECH 0-60 MINS  01/12/2020   LAPAROSCOPIC CHOLECYSTECTOMY  09-09-2006   dr Zella Richer   LOWER EXTREMITY ANGIOGRAPHY N/A 10/27/2016   Procedure: Lower Extremity Angiography;  Surgeon: Angelia Mould, MD;  Location: [facility];  Service: Cardiovascular;  Laterality: N/A;   MASS EXCISION Right 10/30/2016   Procedure: EXCISION RIGHT FOOT WOUND WITH VAC PLACEMENT;  Surgeon: Wallace Going, DO;  Location: [place];  Service: Plastics;  Laterality: Right;   MASS EXCISION Right 07/16/2017   Procedure: EXCISION OF RIGHT FOOT WOUND WITH CELLERATE PLACEMENT;  Surgeon: Wallace Going, DO;  Location: [place];  Service: Plastics;  Laterality: Right;   METATARSAL HEAD EXCISION Left 01/04/2018   Procedure: 5TH METATARSAL RESECTION,  WOUND CLOSURE,  Adjacent Tissue Transfer.;  Surgeon: [name], DPM;  Location: [place];  Service: Podiatry;  Laterality: Left;   SKIN SPLIT GRAFT Right 12/01/2016   Procedure: SKIN GRAFT SPLIT THICKNESS TO RIGHT LATERAL FOOT WOUND 9 X 3 CM;  Surgeon: Wallace Going, DO;  Location[facility];  Service: Plastics;  Laterality: Right;   wisdom teeth extracted  [place] Right 09/03/2020   Procedure: DEBRIDEMENT WOUND, INSERTION OF ANTIBIOTIC BEADS;  Surgeon: [name], DPM;  Location[facility];  Service: Podiatry;  Laterality: Right;   Social History:  reports that he has never smoked. He has never used smokeless tobacco. He reports that he does not drink alcohol and does not use drugs.  No Known Allergies  Family History   Problem Relation Age of Onset   Sarcoidosis Mother    Stroke Father        early 59s. former smoker   Diabetes Father    Hypertension Father    Hypertension Sister    Diabetes Paternal Grandmother    Congenital heart disease Paternal Grandmother        died of complications   Healthy Daughter    Colon cancer Neg Hx    Colon polyps Neg Hx    Esophageal cancer Neg Hx    Rectal cancer Neg Hx    Stomach cancer Neg Hx     Prior to Admission medications   Medication Sig Start Date End Date Taking? Authorizing Provider  amoxicillin-clavulanate (AUGMENTIN) 875-125 MG tablet Take 1 tablet by mouth 2 (two) times daily. 11/05/21 12/17/21  Mignon Pine, DO  ascorbic acid (VITAMIN C) 500 MG tablet Take 500 mg by mouth daily. 08/14/16   [provider]  aspirin EC 81 MG tablet Take 81 mg by mouth daily.    [provider]  atorvastatin (LIPITOR) 80 MG tablet Take 1 tablet (80 mg total) by mouth daily. 05/16/21   Marin Olp, MD  Cyanocobalamin (VITAMIN B-12) 5000 MCG SUBL Place 5,000 mcg under the tongue daily.    [provider]  fenofibrate (TRICOR) 48 MG tablet TAKE 1 TABLET BY MOUTH EVERY DAY Patient taking differently: Take 48 mg by mouth daily. 07/31/21   Marin Olp, MD  glucose blood (ACCU-CHEK GUIDE) test strip Dx E11.49 - Use to check blood glucose three times daily or as directed. 01/30/20   Marin Olp, MD  hydrochlorothiazide (MICROZIDE) 12.5 MG capsule TAKE 1 CAPSULE BY MOUTH EVERY DAY IN THE MORNING 10/16/21   Marin Olp, MD  insulin aspart (NOVOLOG) 100 UNIT/ML FlexPen Inject 15-25 Units into the skin 3 (three) times daily with meals. Please provide pen needles if possible 05/15/21   Marin Olp, MD  insulin degludec (TRESIBA FLEXTOUCH) 200 UNIT/ML FlexTouch Pen Inject 80-90 Units into the skin daily. 09/02/21   Marin Olp, MD  Insulin Syringe-Needle U-100 (BD INSULIN SYRINGE U/F) 31G X 5/16" 0.5 ML MISC Use to inject  insulin 4 times daily. DX:E11.9 05/24/21   Marin Olp, MD  Insulin Syringe-Needle U-100 (BD INSULIN SYRINGE U/F) 31G X 5/16" 1 ML MISC Use to inject tresiba/long acting insulin once daily 06/24/21   Marin Olp, MD  Insulin Syringe-Needle U-100 (BD VEO INSULIN SYR U/F 1/2UNIT) 31G X 15/64" 0.3 ML MISC USE TO INJECT INSULIN FOUR TIMES DAILY AS DIRECTED 10/14/19   Marin Olp, MD  JANUVIA 50 MG tablet TAKE 1 TABLET BY MOUTH EVERY DAY IN THE MORNING Patient taking differently: Take 50  mg by mouth daily. 10/25/21   Marin Olp, MD  Lancets [facility] ULTRASOFT) lancets Use to test blood sugars daily. Dx: E11.9 04/11/20   Marin Olp, MD  Lancets MISC USED TO CHECK BLOOD GLUCOSE 3-4 TIMES DAILY 10/15/18   Marin Olp, MD  Multiple Vitamin (MULTIVITAMIN WITH MINERALS) TABS tablet Take 1 tablet by mouth 2 (two) times daily.    [provider]  Omega-3 Fatty Acids (FISH OIL) 1360 MG CAPS Take 1,360 mg by mouth daily.     [provider]  pyridoxine (B-6) 100 MG tablet Take 100 mg by mouth 2 (two) times daily.    [provider]  ramipril (ALTACE) 5 MG capsule TAKE 1 CAPSULE BY MOUTH EVERY DAY IN THE MORNING Patient taking differently: Take 5 mg by mouth daily. 07/17/21   Marin Olp, MD    Physical Exam: Vitals:   11/06/21 1046 11/06/21 1239 11/06/21 1345  BP: (!) 179/100 136/75   Pulse: (!) 103 80   Resp: 18 20 (!) 9  Temp: 98.5 F (36.9 C)    TempSrc: Oral    SpO2: 97% 96%   Weight: (!) 148.8 kg    Height: 6' (1.829 m)     Physical Exam Vitals and nursing note reviewed.  Constitutional:      General: He is awake.     Appearance: Normal appearance. He is morbidly obese.  HENT:     Head: Normocephalic.     Nose: No rhinorrhea.     Mouth/Throat:     Mouth: Mucous membranes are moist.  Eyes:     General: No scleral icterus.    Pupils: Pupils are equal, round, and reactive to light.  Cardiovascular:     Rate and Rhythm: Normal  rate and regular rhythm.  Pulmonary:     Effort: Pulmonary effort is normal.     Breath sounds: Normal breath sounds.  Abdominal:     General: Bowel sounds are normal. There is no distension.     Palpations: Abdomen is soft.     Tenderness: There is no abdominal tenderness. There is no right CVA tenderness, left CVA tenderness or guarding.  Musculoskeletal:     Cervical back: Neck supple.     Right lower leg: No edema.     Left lower leg: No edema.  Feet:     Comments: Right foot 4 and fifth finger amputation.  There is pack plantar wound without discharge, surrounding edema or erythema. Skin:    General: Skin is warm and dry.  Neurological:     General: No focal deficit present.     Mental Status: He is alert and oriented to person, place, and time.  Psychiatric:        Mood and Affect: Mood normal.        Behavior: Behavior normal. Behavior is cooperative.     Data Reviewed:  There are no new results to review at this time.  Assessment and Plan: Principal Problem:   Symptomatic anemia Due to:   Iron deficiency anemia Observation/telemetry. Clearly with diet. Keep NPO after midnight Continue pantoprazole 40 mg every 12 hours. Follow posttransfusion H&H. Transfuse further as needed. GI consult requested.  Active Problems:   Type II diabetes mellitus with neurological manifestations (HCC) Clear liquid diet for now. Continue Tresiba 80 units daily. CBG monitoring before meals and bedtime.    Hyperlipidemia associated with type 2 diabetes mellitus (HCC) Continue atorvastatin 40 mg p.o. daily.   Continue fenofibrate 54  mg p.o. daily. Continue fish oils daily as well.    Essential hypertension Continue hydrochlorothiazide 12.5 mg p.o. daily. Continue ramipril 5 mg p.o. daily. Monitor BP, renal function and electrolytes.    CKD (chronic kidney disease), stage II Monitor glomerular filtration rate. Follow-up with nephrology as an outpatient.    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 Begin Augmentin for chronic suppression per ID recommendation. Follow-up at the wound clinic and hyperbaric chamber as scheduled.     Advance Care Planning:   Code Status: Full Code   Consults: Secure message left for [facility] Lucio Kyrese, MD)  Family Communication:   Severity of Illness: The appropriate patient status for this patient is OBSERVATION. Observation status is judged to be reasonable and necessary in order to provide the required intensity of service to ensure the patient's safety. The patient's presenting symptoms, physical exam findings, and initial radiographic and laboratory data in the context of their medical condition is felt to place them at decreased risk for further clinical deterioration. Furthermore, it is anticipated that the patient will be medically stable for discharge from the hospital within 2 midnights of admission.   Author: Reubin Milan, MD 11/06/2021 2:23 PM  For on call review [email or web address].   This document was prepared using Dragon voice recognition software and may contain some unintended transcription errors.

## 2021-11-06 NOTE — Progress Notes (Signed)
[name], Tayvion O ([id number]) [id number] Page 1 of 1 Visit Report for 11/06/2021 SuperBill Details Patient Name: Date of Service: [place], [place] [name] 11/06/2021 Medical Record Number: [id number] Patient Account Number: [id number] Date of Birth/Sex: Treating RN: [birth year] (50 y.o. [name] Provider: [name] Other Clinician: Blanche East Referring Provider: Treating Provider/Extender: [name] in Treatment: 17 Diagnosis Coding ICD-10 Codes Code Description E11.49 Type 2 diabetes mellitus with other diabetic neurological complication K95.747 Other chronic osteomyelitis, right ankle and foot L97.514 Non-pressure chronic ulcer of other part of right foot with necrosis of bone Facility Procedures CPT4 Code Description Modifier Quantity [id number] G0277-(Facility Use Only) HBOT full body chamber, 49mn , 4 ICD-10 Diagnosis Description E11.49 Type 2 diabetes mellitus with other diabetic neurological complication LK38.381Non-pressure chronic ulcer of other part of right foot with necrosis of bone M86.671 Other chronic osteomyelitis, right ankle and foot Physician Procedures Quantity CPT4 Code Description Modifier [id number] 36067- WC PHYS HYPERBARIC OXYGEN THERAPY 1 ICD-10 Diagnosis Description E11.49 Type 2 diabetes mellitus with other diabetic neurological complication LP03.403Non-pressure chronic ulcer of other part of right foot with necrosis of bone M86.671 Other chronic osteomyelitis, right ankle and foot Electronic Signature(s) Signed: 11/06/2021 1:24:05 PM By: GValeria BatmanEMT Signed: 11/06/2021 1:30:15 PM By: CFredirick MaudlinMD FACS Entered By: GValeria Batmanon 11/06/2021 13:24:05

## 2021-11-06 NOTE — Telephone Encounter (Signed)
Called patient to relay results and need to go to the emergency department for blood transfusion. Left HIPAA compliant voicemail requesting callback on both mobile and home numbers.   It is unlikely that infusion center will be able to complete transfusion as type and screen will be needed.   Beryle Flock, RN

## 2021-11-06 NOTE — H&P (View-Only) (Signed)
Attending physician's note   I have taken a history, reviewed the chart, and examined the patient. I performed a substantive portion of this encounter, including complete performance of at least one of the key components, in conjunction with the APP. I agree with the APP's note, impression, and recommendations with my edits.   51 year old male with medical history as outlined below, to include history of diabetes c/b peripheral neuropathy and CKD 3, HTN, HLD, chronic IDA (takes oral iron chronically), B12 deficiency, and recent osteomyelitis requiring prolonged IV ABX and recent transition to oral ABX, presenting with melena and symptomatic acute on chronic anemia.  Denies any prior history of GI bleed.  Had a colonoscopy earlier this year by Dr. Arelia Longest which was normal.  No previous EGD  Admission evaluation notable for the following: - H/H 6.4/20 with MCV/RDW 80/13 - FOBT negative - INR 1.1 - Ferritin 340, iron 110, TIBC 251, sat 44% - Normal B12  Comparison lab:  - 05/15/2021: H/H 12/36.7 with MCV/RDW 78/14 - 10/09/2021: H/H 9.4/29.3 with MCV/RDW 79.8/13  1) Acute blood loss anemia 2) Melena 3) Symptomatic anemia - PRBC transfusion  - Trend serial CBCs with additional blood products as needed per protocol - High-dose Protonix IV - Clears okay with n.p.o. at midnight - EGD tomorrow.  Explained that if EGD is negative/normal, plan for VCE placement afterwards  4) Osteomyelitis - Antimicrobial therapy per primary Hospitalist service  The indications, risks, and benefits of EGD were explained to the patient in detail. Risks include but are not limited to bleeding, perforation, adverse reaction to medications, and cardiopulmonary compromise. Sequelae include but are not limited to the possibility of surgery, hospitalization, and mortality. The patient verbalized understanding and wished to proceed.    Gerrit Heck, DO, [place] [phone] office           Consultation  Referring Provider:   Dr. Olevia Bowens Primary Care Physician:  Marin Olp, MD Primary Gastroenterologist:  Dr. 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 -Protonix 40 mg IV BID. -Clear liquid diet, NPO at midnight. --Continue to monitor H&H with transfusion as needed to maintain hemoglobin greater than 7. - CBC in the morning, need HGB greater than 7 for EGD. -Plan for EGD tomorrow. I thoroughly discussed the procedure to include nature, alternatives, benefits, and risks including but not limited to bleeding, perforation, infection, anesthesia/cardiac and pulmonary complications. Patient provides understanding and gave verbal  consent to proceed. -If EGD is negative can proceed with VCE to evaluate further.   Thank you for your kind consultation, we will continue to follow.         HPI:   Baker Moronta [name] is a 51 y.o. male with past medical history  significant for insulin-dependent type 2 diabetes peripheral neuropathy and CKD stage III, hypertension, hyperlipidemia, history of tibial artery occlusion 2018 seen by Dr. 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  04/08/2021 screening colonoscopy with Dr. Hanley Seamen looping, good bowel prep, normal colonoscopy recall 10 years.  Recent cellulitis and abscess of left foot complicated by osteomyelitis with 12 weeks of antibiotic therapy with cefepime/daptomycin followed by 6 weeks of linezolid/levofloxacin who was sent by the hyperbaric chamber to the emergency department after his hemoglobin level was 6.4 g/dL. In the ER patient was hypertensive blood pressure 179/100, afebrile, normal pulse respirations 97% on room air. CBC showed WBC 6.9, afebrile, hemodynamically stable evidence of hypertension on admission. hemoglobin 6.4 on admission, 10/17/2021 hemoglobin 9.4, baseline appears to be 12 10/07/2021.  Iron 110, saturations 44, B12 773, ferritin 340.  Pending retic count Platelets 230.  INR 1.1, no elevation of LFTs. BUN 23, creatinine 1.42, yesterday patient had elevation of 27 of BUN, creatinine 1.70-appears to be close to baseline at this time, no acute elevation of BUN. Troponin and BNP negative.  Chest x-ray unremarkable and PICC line right upper extremity.  Patient said he has never had anything like this happen before, has been told he has been anemic and iron deficient for years and has been on iron twice daily without fail. Patient's been doing 12-week antibiotic treatment, IV PICC line to 6 weeks of oral antibiotics, states with starting the oral antibiotics with taking this with his vitamins he was having some " stomach griping"  but otherwise denies any GI symptoms. Denies nausea, vomiting, reflux.  Denies any dysphagia or odynophagia.  Denies any abdominal pain. Has been having bowel movement more frequently with antibiotic use, 3-4 daily formed without straining.  Denies hematochezia or melena.  States he has darker stools with the antibiotics but they have not been tarry or sticky. For the past 3 weeks he states he has had a little bit more dyspnea with exertion and increased heart rate but denies chest pain, palpitations, diaphoresis, dizziness, fever or chills. Denies any any easy bruising or bleeding. Denies NSAID use, alcohol. Denies family history of throat, stomach cancer, stomach ulcers.  Abnormal ED labs: Abnormal Labs Reviewed  COMPREHENSIVE METABOLIC PANEL - Abnormal; Notable for the following components:      Result Value   Glucose, Bld 143 (*)    BUN 23 (*)    Creatinine, Ser 1.42 (*)    GFR, Estimated 60 (*)    All other components within normal limits  CBC - Abnormal; Notable for the following components:   RBC 2.49 (*)    Hemoglobin 6.4 (*)    HCT 20.0 (*)    MCH 25.7 (*)    All other components within normal limits  IRON AND TIBC - Abnormal; Notable for the following components:   Saturation Ratios 44 (*)    All other components within normal limits  FERRITIN - Abnormal; Notable for the following components:   Ferritin 340 (*)    All other components within normal  limits     Past Medical History:  Diagnosis Date   Cellulitis and abscess of foot    05/ 2021 left   CKD (chronic kidney disease), stage III ([place]) 06-17-2019 per pt last visit 05-29-2016 (note in care everywhere)  currently followed by pcp   nephrologist-  dr [name] ([facility] in [place])     Diabetic peripheral neuropathy ([place])    Elevated LFTs    followed by pcp   Hypertension    followed by pcp---- (06-17-2019 pt stated never had a stress test)   Iron deficiency anemia    Mixed hyperlipidemia     Pituitary microadenoma ([place]) 05/26/2001   Prolactinoma, 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   (06-17-2019 pt stated has been stable with no changes and followed by pcp)   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[place])    11-06-2016  lower extremity angiography (dr [name])  mil tibial disease w/ occlusion of the distal peroneal artery and dorsalis pedis artery but has widely patent posterior tibial and anterior tibial arterties   Type 2 diabetes mellitus treated with insulin ([place])    followed by pcp   (06-17-2019  pt stated checks blood sugar daily in am,  fasting sugar-- average 71)   Vitamin B 12 deficiency     Surgical History:  He  has a past surgical history that includes wisdom teeth extracted (1984); Anal fissure repair (2000); Amputation (Right, 07/31/2016); Amputation (Right, 08/08/2016); Incision and drainage of wound (Right, 08/13/2016); Application of a-cell of extremity (Right, 08/13/2016); Circumcision (2006); I & D extremity (Right, 09/18/2016); Application if wound vac (Right, 09/18/2016); Lower Extremity Angiography (N/A, 10/27/2016); Mass excision (Right, 10/30/2016); Application if wound vac (Right, 10/30/2016); Skin split graft (Right, 12/01/2016); Application if wound vac (Right, 12/01/2016); I & D extremity (Right, 03/11/2017); Mass excision (Right, 07/16/2017); Application of a-cell of extremity (Right, 07/16/2017); Debridement  foot (Right, 07/16/2017); Bone biopsy (Left, 01/04/2018); Metatarsal head excision (Left, 01/04/2018); Laparoscopic cholecystectomy (09-09-2006   dr Zella Richer); Amputation toe (Left, 06/21/2019); IR PATIENT EVAL TECH 0-60 MINS (01/12/2020); Amputation toe (Right, 09/03/2020); Wound debridement (Right, 09/03/2020); Bone biopsy (Right, 09/03/2020); and Colonoscopy (04/08/2021). Family History:  His family history includes Congenital heart disease in his paternal grandmother; Diabetes in his father and paternal grandmother; Healthy in his daughter; Hypertension in his father and sister;  Sarcoidosis in his mother; Stroke in his father. Social History:   reports that he has never smoked. He has never used smokeless tobacco. He reports that he does not drink alcohol and does not use drugs.  Prior to Admission medications   Medication Sig Start Date End Date Taking? Authorizing Provider  ascorbic acid (VITAMIN C) 500 MG tablet Take 500 mg by mouth daily. 08/14/16  Yes [provider]  aspirin EC 81 MG tablet Take 81 mg by mouth daily.   Yes [provider]  atorvastatin (LIPITOR) 80 MG tablet Take 1 tablet (80 mg total) by mouth daily. 05/16/21  Yes Marin Olp, MD  Cyanocobalamin (VITAMIN B-12) 5000 MCG SUBL Place 5,000 mcg under the tongue daily.   Yes [provider]  fenofibrate (TRICOR) 48 MG tablet TAKE 1 TABLET BY MOUTH EVERY DAY Patient taking differently: Take 48 mg by mouth daily. 07/31/21  Yes Marin Olp, MD  hydrochlorothiazide (MICROZIDE) 12.5 MG capsule TAKE 1 CAPSULE BY MOUTH EVERY DAY IN THE MORNING Patient taking differently: Take 12.5 mg by mouth daily. TAKE 1 CAPSULE BY MOUTH EVERY DAY IN THE MORNING 10/16/21  Yes Marin Olp, MD  insulin aspart (NOVOLOG) 100 UNIT/ML FlexPen Inject 15-25 Units into the skin 3 (three) times daily with meals. Please provide pen needles if possible Patient taking differently: Inject 15-25 Units into the skin 3 (three) times daily with meals. Per sliding scale 05/15/21  Yes Marin Olp, MD  insulin degludec (TRESIBA FLEXTOUCH) 200 UNIT/ML FlexTouch Pen Inject 80-90 Units into the skin daily. Patient taking differently: Inject 80-90 Units into the skin daily. Per sliding scale 09/02/21  Yes Marin Olp, MD  JANUVIA 50 MG tablet TAKE 1 TABLET BY MOUTH EVERY DAY IN THE MORNING Patient taking differently: Take 50 mg by mouth daily. 10/25/21  Yes Marin Olp, MD  Multiple Vitamin (MULTIVITAMIN WITH MINERALS) TABS tablet Take 1 tablet by mouth 2 (two) times daily.   Yes [provider]  Omega-3 Fatty Acids (FISH OIL) 1360 MG CAPS Take 1,360 mg by mouth daily.    Yes [provider]  pyridoxine (B-6) 100 MG tablet Take 100 mg by mouth 2 (two) times daily.   Yes [provider]  ramipril (ALTACE) 5 MG capsule TAKE 1 CAPSULE BY MOUTH EVERY DAY IN THE MORNING Patient taking differently: Take 5 mg by mouth daily. 07/17/21  Yes Marin Olp, MD  amoxicillin-clavulanate (AUGMENTIN) 875-125 MG tablet Take 1 tablet by mouth 2 (two) times daily. Patient not taking: Reported on 11/06/2021 11/05/21 12/17/21  Mignon Pine, DO  glucose blood (ACCU-CHEK GUIDE) test strip Dx E11.49 - Use to check blood glucose three times daily or as directed. 01/30/20   Marin Olp, MD  Insulin Syringe-Needle U-100 (BD INSULIN SYRINGE U/F) 31G X 5/16" 0.5 ML MISC Use to inject insulin 4 times daily. DX:E11.9 05/24/21   Marin Olp, MD  Insulin Syringe-Needle U-100 (BD INSULIN SYRINGE U/F) 31G X 5/16" 1 ML MISC Use to inject tresiba/long acting insulin once daily 06/24/21   Marin Olp, MD  Insulin Syringe-Needle U-100 (BD VEO INSULIN SYR U/F 1/2UNIT) 31G X 15/64" 0.3 ML MISC USE TO INJECT INSULIN FOUR TIMES DAILY AS DIRECTED 10/14/19   Marin Olp, MD  Lancets [facility] ULTRASOFT) lancets Use to test blood sugars daily. Dx: E11.9 04/11/20   Marin Olp, MD  Lancets MISC USED TO CHECK BLOOD GLUCOSE 3-4 TIMES DAILY 10/15/18   Marin Olp, MD    Current Facility-Administered Medications  Medication Dose Route Frequency Provider Last Rate Last Admin   0.9 %  sodium chloride infusion  10 mL/hr Intravenous Once Rancour, Annie Main, MD       acetaminophen (TYLENOL) tablet 650 mg  650 mg Oral Q6H PRN Reubin Milan, MD       Or   acetaminophen (TYLENOL) suppository 650 mg  650 mg Rectal Q6H PRN Reubin Milan, MD       amoxicillin-clavulanate (AUGMENTIN) 875-125 MG per tablet 1 tablet  1 tablet Oral BID Reubin Milan, MD       [START  ON 11/07/2021] aspirin EC tablet 81 mg  81 mg Oral Daily Reubin Milan, MD       [START ON 11/07/2021] atorvastatin (LIPITOR) tablet 80 mg  80 mg Oral Daily Reubin Milan, MD       [START ON 11/07/2021] fenofibrate tablet 54 mg  54 mg Oral Daily Reubin Milan, MD       [START ON 11/07/2021] hydrochlorothiazide (MICROZIDE) capsule 12.5 mg  12.5 mg Oral Daily Reubin Milan, MD       insulin aspart (novoLOG)  injection 0-20 Units  0-20 Units Subcutaneous TID WC Reubin Milan, MD       [START ON 11/07/2021] insulin detemir (LEVEMIR) injection 60 Units  60 Units Subcutaneous Daily Reubin Milan, MD       [START ON 11/07/2021] linagliptin (TRADJENTA) tablet 5 mg  5 mg Oral Daily Reubin Milan, MD       ondansetron [facility]) tablet 4 mg  4 mg Oral Q6H PRN Reubin Milan, MD       Or   ondansetron [facility]) injection 4 mg  4 mg Intravenous Q6H PRN Reubin Milan, MD       pantoprazole (PROTONIX) injection 40 mg  40 mg Intravenous Q12H Reubin Milan, MD       pyridOXINE (VITAMIN B6) tablet 100 mg  100 mg Oral BID Reubin Milan, MD       [START ON 11/07/2021] ramipril (ALTACE) capsule 5 mg  5 mg Oral Daily Reubin Milan, MD       [START ON 11/07/2021] Vitamin B-12 SUBL 5,000 mcg  5,000 mcg Sublingual Daily Reubin Milan, MD       Current Outpatient Medications  Medication Sig Dispense Refill   ascorbic acid (VITAMIN C) 500 MG tablet Take 500 mg by mouth daily.     aspirin EC 81 MG tablet Take 81 mg by mouth daily.     atorvastatin (LIPITOR) 80 MG tablet Take 1 tablet (80 mg total) by mouth daily. 90 tablet 3   Cyanocobalamin (VITAMIN B-12) 5000 MCG SUBL Place 5,000 mcg under the tongue daily.     fenofibrate (TRICOR) 48 MG tablet TAKE 1 TABLET BY MOUTH EVERY DAY (Patient taking differently: Take 48 mg by mouth daily.) 90 tablet 4   hydrochlorothiazide (MICROZIDE) 12.5 MG capsule TAKE 1 CAPSULE BY MOUTH EVERY DAY IN THE MORNING  (Patient taking differently: Take 12.5 mg by mouth daily. TAKE 1 CAPSULE BY MOUTH EVERY DAY IN THE MORNING) 100 capsule 3   insulin aspart (NOVOLOG) 100 UNIT/ML FlexPen Inject 15-25 Units into the skin 3 (three) times daily with meals. Please provide pen needles if possible (Patient taking differently: Inject 15-25 Units into the skin 3 (three) times daily with meals. Per sliding scale) 45 mL 3   insulin degludec (TRESIBA FLEXTOUCH) 200 UNIT/ML FlexTouch Pen Inject 80-90 Units into the skin daily. (Patient taking differently: Inject 80-90 Units into the skin daily. Per sliding scale) 15 mL 3   JANUVIA 50 MG tablet TAKE 1 TABLET BY MOUTH EVERY DAY IN THE MORNING (Patient taking differently: Take 50 mg by mouth daily.) 90 tablet 1   Multiple Vitamin (MULTIVITAMIN WITH MINERALS) TABS tablet Take 1 tablet by mouth 2 (two) times daily.     Omega-3 Fatty Acids (FISH OIL) 1360 MG CAPS Take 1,360 mg by mouth daily.      pyridoxine (B-6) 100 MG tablet Take 100 mg by mouth 2 (two) times daily.     ramipril (ALTACE) 5 MG capsule TAKE 1 CAPSULE BY MOUTH EVERY DAY IN THE MORNING (Patient taking differently: Take 5 mg by mouth daily.) 90 capsule 4   amoxicillin-clavulanate (AUGMENTIN) 875-125 MG tablet Take 1 tablet by mouth 2 (two) times daily. (Patient not taking: Reported on 11/06/2021) 84 tablet 0   glucose blood (ACCU-CHEK GUIDE) test strip Dx E11.49 - Use to check blood glucose three times daily or as directed. 300 each 1   Insulin Syringe-Needle U-100 (BD INSULIN SYRINGE U/F) 31G X 5/16" 0.5 ML MISC Use  to inject insulin 4 times daily. DX:E11.9 100 each 4   Insulin Syringe-Needle U-100 (BD INSULIN SYRINGE U/F) 31G X 5/16" 1 ML MISC Use to inject tresiba/long acting insulin once daily 100 each 3   Insulin Syringe-Needle U-100 (BD VEO INSULIN SYR U/F 1/2UNIT) 31G X 15/64" 0.3 ML MISC USE TO INJECT INSULIN FOUR TIMES DAILY AS DIRECTED 300 each 2   Lancets (ONETOUCH ULTRASOFT) lancets Use to test blood sugars  daily. Dx: E11.9 100 each 12   Lancets MISC USED TO CHECK BLOOD GLUCOSE 3-4 TIMES DAILY 200 each 3    Allergies as of 11/06/2021   (No Known Allergies)    Review of Systems:    Constitutional: No weight loss, fever, chills, weakness or fatigue HEENT: Eyes: No change in vision               Ears, Nose, Throat:  No change in hearing or congestion Skin: No rash or itching Cardiovascular: No chest pain, chest pressure or palpitations   Respiratory: No SOB or cough Gastrointestinal: See HPI and otherwise negative Genitourinary: No dysuria or change in urinary frequency Neurological: No headache, dizziness or syncope Musculoskeletal: No new muscle or joint pain Hematologic: No bleeding or bruising Psychiatric: No history of depression or anxiety     Physical Exam:  Vital signs in last 24 hours: Temp:  [98 F (36.7 C)-98.5 F (36.9 C)] 98 F (36.7 C) (10/18 1448) Pulse Rate:  [80-103] 84 (10/18 1448) Resp:  [9-20] 16 (10/18 1448) BP: (136-179)/(75-100) 136/75 (10/18 1239) SpO2:  [96 %-100 %] 100 % (10/18 1448) Weight:  [148.8 kg] 148.8 kg (10/18 1046)   Last BM recorded by nurses in past 5 days No data recorded  General:   Pleasant, obese male in no acute distress Head:  Normocephalic and atraumatic. Eyes: sclerae anicteric,conjunctive pale  Heart:  regular rate and rhythm, no murmurs or gallops Pulm: Clear anteriorly; no wheezing Abdomen:  Soft, Obese AB, Active bowel sounds. No tenderness . , No organomegaly appreciated. Extremities: Right fourth and fifth metatarsal amputation, mild edema, medial malleolus slightly enlarged but nontender. Msk:  Symmetrical without gross deformities. Peripheral pulses intact.  Neurologic:  Alert and  oriented x4;  No focal deficits.  Skin:   Dry and intact without significant lesions or rashes. Psychiatric:  Cooperative. Normal mood and affect.  LAB RESULTS: Recent Labs    11/05/21 1116 11/06/21 1217  WBC 6.3 6.9  HGB 6.7* 6.4*  HCT  20.7* 20.0*  PLT 248 230   BMET Recent Labs    11/05/21 1116 11/06/21 1217  NA 142 142  K 4.2 3.8  CL 108 110  CO2 28 27  GLUCOSE 170* 143*  BUN 27* 23*  CREATININE 1.70* 1.42*  CALCIUM 9.6 9.5   LFT Recent Labs    11/06/21 1217  PROT 7.5  ALBUMIN 3.8  AST 19  ALT 24  ALKPHOS 51  BILITOT 0.9   PT/INR Recent Labs    11/06/21 1341  LABPROT 13.9  INR 1.1    STUDIES: No results found.   Vladimir Crofts  11/06/2021, 3:18 PM

## 2021-11-06 NOTE — ED Provider Notes (Signed)
[facility] DEPT Provider Note   CSN: [id number] Arrival date & time: 11/06/21  1032     History  Chief Complaint  Patient presents with   Abnormal Labs    [name] is a 51 y.o. male.  Patient sent from hyperbaric clinic with anemia.  He is being seen for a chronic right foot ulcer and osteomyelitis.  He has completed a course of IV linezolid and is versus which Augmentin.  He reports the wound is healing well.  He was contacted by infectious disease today and told that his hemoglobin was 6.7 and he did receive blood transfusion.  He has noticed increased shortness of breath with ambulation for the past several weeks but denies any chest pain or shortness of breath currently.  No dizziness or lightheadedness.  Reports his stools are intermittently dark but denies any gross bleeding from his stool.  Denies any significant bleeding from his wound.  No blood thinners other than aspirin.  Reports normal colonoscopy earlier this year.  No history of acid reflux or ulcers. He has never required a blood transfusion previously.   Infectious disease thought his anemia could be due to linezolid use.  The history is provided by the patient.       Home Medications Prior to Admission medications   Medication Sig Start Date End Date Taking? Authorizing Provider  amoxicillin-clavulanate (AUGMENTIN) 875-125 MG tablet Take 1 tablet by mouth 2 (two) times daily. 11/05/21 12/17/21  Mignon Pine, DO  ascorbic acid (VITAMIN C) 500 MG tablet Take 500 mg by mouth daily. 08/14/16   [provider]  aspirin EC 81 MG tablet Take 81 mg by mouth daily.    [provider]  atorvastatin (LIPITOR) 80 MG tablet Take 1 tablet (80 mg total) by mouth daily. 05/16/21   Marin Olp, MD  Cyanocobalamin (VITAMIN B-12) 5000 MCG SUBL Place 5,000 mcg under the tongue daily.    [provider]  fenofibrate (TRICOR) 48 MG tablet TAKE 1 TABLET BY  MOUTH EVERY DAY 07/31/21   Marin Olp, MD  glucose blood (ACCU-CHEK GUIDE) test strip Dx E11.49 - Use to check blood glucose three times daily or as directed. 01/30/20   Marin Olp, MD  hydrochlorothiazide (MICROZIDE) 12.5 MG capsule TAKE 1 CAPSULE BY MOUTH EVERY DAY IN THE MORNING 10/16/21   Marin Olp, MD  insulin aspart (NOVOLOG) 100 UNIT/ML FlexPen Inject 15-25 Units into the skin 3 (three) times daily with meals. Please provide pen needles if possible 05/15/21   Marin Olp, MD  insulin degludec (TRESIBA FLEXTOUCH) 200 UNIT/ML FlexTouch Pen Inject 80-90 Units into the skin daily. 09/02/21   Marin Olp, MD  Insulin Syringe-Needle U-100 (BD INSULIN SYRINGE U/F) 31G X 5/16" 0.5 ML MISC Use to inject insulin 4 times daily. DX:E11.9 05/24/21   Marin Olp, MD  Insulin Syringe-Needle U-100 (BD INSULIN SYRINGE U/F) 31G X 5/16" 1 ML MISC Use to inject tresiba/long acting insulin once daily 06/24/21   Marin Olp, MD  Insulin Syringe-Needle U-100 (BD VEO INSULIN SYR U/F 1/2UNIT) 31G X 15/64" 0.3 ML MISC USE TO INJECT INSULIN FOUR TIMES DAILY AS DIRECTED 10/14/19   Marin Olp, MD  JANUVIA 50 MG tablet TAKE 1 TABLET BY MOUTH EVERY DAY IN THE MORNING 10/25/21   Marin Olp, MD  Lancets [facility] ULTRASOFT) lancets Use to test blood sugars daily. Dx: E11.9 04/11/20   Marin Olp, MD  Lancets MISC  USED TO CHECK BLOOD GLUCOSE 3-4 TIMES DAILY 10/15/18   Marin Olp, MD  Multiple Vitamin (MULTIVITAMIN WITH MINERALS) TABS tablet Take 1 tablet by mouth 2 (two) times daily.    [provider]  Omega-3 Fatty Acids (FISH OIL) 1360 MG CAPS Take 1,360 mg by mouth daily.     [provider]  pyridoxine (B-6) 100 MG tablet Take 100 mg by mouth 2 (two) times daily.    [provider]  ramipril (ALTACE) 5 MG capsule TAKE 1 CAPSULE BY MOUTH EVERY DAY IN THE MORNING 07/17/21   Marin Olp, MD      Allergies    Patient has no known  allergies.    Review of Systems   Review of Systems  Constitutional:  Positive for fatigue. Negative for activity change.  Respiratory:  Positive for shortness of breath. Negative for chest tightness.   Cardiovascular:  Negative for palpitations.  Gastrointestinal:  Negative for abdominal pain, nausea and vomiting.  Genitourinary:  Negative for dysuria and hematuria.  Musculoskeletal:  Negative for arthralgias and myalgias.  Skin:  Positive for wound.  Neurological:  Positive for weakness.    all other systems are negative except as noted in the HPI and PMH.   Physical Exam Updated Vital Signs BP 136/75   Pulse 80   Temp 98.5 F (36.9 C) (Oral)   Resp 20   Ht 6' (1.829 m)   Wt (!) 148.8 kg   SpO2 96%   BMI 44.48 kg/m  Physical Exam Vitals and nursing note reviewed.  Constitutional:      General: He is not in acute distress.    Appearance: He is well-developed.  HENT:     Head: Normocephalic and atraumatic.     Mouth/Throat:     Pharynx: No oropharyngeal exudate.  Eyes:     Conjunctiva/sclera: Conjunctivae normal.     Pupils: Pupils are equal, round, and reactive to light.     Comments: Pale conjunctiva  Neck:     Comments: No meningismus. Cardiovascular:     Rate and Rhythm: Normal rate and regular rhythm.     Heart sounds: Normal heart sounds. No murmur heard. Pulmonary:     Effort: Pulmonary effort is normal. No respiratory distress.     Breath sounds: Normal breath sounds.  Abdominal:     Palpations: Abdomen is soft.     Tenderness: There is no abdominal tenderness. There is no guarding or rebound.  Genitourinary:    Comments: Brown stool on rectal exam, no gross blood Musculoskeletal:        General: No tenderness. Normal range of motion.     Cervical back: Normal range of motion and neck supple.     Comments: Healing wound to right plantar foot with packing in place.  No significant surrounding erythema bleeding or drainage.  Skin:    General: Skin is  warm.  Neurological:     Mental Status: He is alert and oriented to person, place, and time.     Cranial Nerves: No cranial nerve deficit.     Motor: No abnormal muscle tone.     Coordination: Coordination normal.     Comments:  5/5 strength throughout. CN 2-12 intact.Equal grip strength.   Psychiatric:        Behavior: Behavior normal.     ED Results / Procedures / Treatments   Labs (all labs ordered are listed, but only abnormal results are displayed) Labs Reviewed  COMPREHENSIVE METABOLIC PANEL - Abnormal;  Notable for the following components:      Result Value   Glucose, Bld 143 (*)    BUN 23 (*)    Creatinine, Ser 1.42 (*)    GFR, Estimated 60 (*)    All other components within normal limits  CBC - Abnormal; Notable for the following components:   RBC 2.49 (*)    Hemoglobin 6.4 (*)    HCT 20.0 (*)    MCH 25.7 (*)    All other components within normal limits  IRON AND TIBC - Abnormal; Notable for the following components:   Saturation Ratios 44 (*)    All other components within normal limits  FERRITIN - Abnormal; Notable for the following components:   Ferritin 340 (*)    All other components within normal limits  RETICULOCYTES - Abnormal; Notable for the following components:   Retic Ct Pct <0.4 (*)    RBC. 2.57 (*)    Immature Retic Fract 0.0 (*)    All other components within normal limits  VITAMIN B12  PROTIME-INR  BRAIN NATRIURETIC PEPTIDE  HEMOGLOBIN AND HEMATOCRIT, BLOOD  FOLATE  HIV ANTIBODY (ROUTINE TESTING W REFLEX)  CBC  COMPREHENSIVE METABOLIC PANEL  TYPE AND SCREEN  PREPARE RBC (CROSSMATCH)  ABO/RH  TROPONIN I (HIGH SENSITIVITY)  TROPONIN I (HIGH SENSITIVITY)    EKG None  Radiology No results found.  Procedures .Critical Care  Performed by: Ezequiel Essex, MD Authorized by: Ezequiel Essex, MD   Critical care provider statement:    Critical care time (minutes):  45   Critical care time was exclusive of:  Separately billable  procedures and treating other patients   Critical care was necessary to treat or prevent imminent or life-threatening deterioration of the following conditions: symptomatic anemia.   Critical care was time spent personally by me on the following activities:  Development of treatment plan with patient or surrogate, discussions with consultants, evaluation of patient's response to treatment, examination of patient, ordering and review of laboratory studies, ordering and review of radiographic studies, ordering and performing treatments and interventions, pulse oximetry, re-evaluation of patient's condition and review of old charts   I assumed direction of critical care for this patient from another provider in my specialty: no     Care discussed with: admitting provider       Medications Ordered in ED Medications  pantoprazole (PROTONIX) injection 40 mg (has no administration in time range)    ED Course/ Medical Decision Making/ A&P                           Medical Decision Making Amount and/or Complexity of Data Reviewed Labs: ordered. Decision-making details documented in ED Course. Radiology: ordered and independent interpretation performed. Decision-making details documented in ED Course. ECG/medicine tests: ordered and independent interpretation performed. Decision-making details documented in ED Course.  Risk Prescription drug management. Decision regarding hospitalization.  Symptomatic anemia with hemoglobin 6.7.  Mild tachycardia but hemodynamically stable.  No blood thinner use.  Has noticed dyspnea on exertion in retrospect for the past several weeks but no chest pain.  Hemoglobin 6.7 today.  Has downtrended from 12 over the past 1 month. Negative colonoscopy in March 2023. Hemoccult testing is negative.  Patient agreeable to blood transfusion after discussion of risks and benefits. Anemia panel sent.   D/w Dr. Fuller Plan of GI. They will evaluate. Start IV protonix.  Vitals  remain stable the ED. Plan admission for transfusion and further workup  of symptomatic anemia. D/w Dr. Olevia Bowens.        Final Clinical Impression(s) / ED Diagnoses Final diagnoses:  None    Rx / DC Orders ED Discharge Orders     None         Kimmy Parish, Annie Main, MD 11/06/21 [phone]

## 2021-11-06 NOTE — Consult Note (Addendum)
Attending physician's note   I have taken a history, reviewed the chart, and examined the patient. I performed a substantive portion of this encounter, including complete performance of at least one of the key components, in conjunction with the APP. I agree with the APP's note, impression, and recommendations with my edits.   51 year old male with medical history as outlined below, to include history of diabetes c/b peripheral neuropathy and CKD 3, HTN, HLD, chronic IDA (takes oral iron chronically), B12 deficiency, and recent osteomyelitis requiring prolonged IV ABX and recent transition to oral ABX, presenting with melena and symptomatic acute on chronic anemia.  Denies any prior history of GI bleed.  Had a colonoscopy earlier this year by Dr. Arelia Longest which was normal.  No previous EGD  Admission evaluation notable for the following: - H/H 6.4/20 with MCV/RDW 80/13 - FOBT negative - INR 1.1 - Ferritin 340, iron 110, TIBC 251, sat 44% - Normal B12  Comparison lab:  - 05/15/2021: H/H 12/36.7 with MCV/RDW 78/14 - 10/09/2021: H/H 9.4/29.3 with MCV/RDW 79.8/13  1) Acute blood loss anemia 2) Melena 3) Symptomatic anemia - PRBC transfusion  - Trend serial CBCs with additional blood products as needed per protocol - High-dose Protonix IV - Clears okay with n.p.o. at midnight - EGD tomorrow.  Explained that if EGD is negative/normal, plan for VCE placement afterwards  4) Osteomyelitis - Antimicrobial therapy per primary Hospitalist service  The indications, risks, and benefits of EGD were explained to the patient in detail. Risks include but are not limited to bleeding, perforation, adverse reaction to medications, and cardiopulmonary compromise. Sequelae include but are not limited to the possibility of surgery, hospitalization, and mortality. The patient verbalized understanding and wished to proceed.    Gerrit Heck, DO, [place] [phone] office           Consultation  Referring Provider:   Dr. Olevia Bowens Primary Care Physician:  Marin Olp, MD Primary Gastroenterologist:  Dr. 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 -Protonix 40 mg IV BID. -Clear liquid diet, NPO at midnight. --Continue to monitor H&H with transfusion as needed to maintain hemoglobin greater than 7. - CBC in the morning, need HGB greater than 7 for EGD. -Plan for EGD tomorrow. I thoroughly discussed the procedure to include nature, alternatives, benefits, and risks including but not limited to bleeding, perforation, infection, anesthesia/cardiac and pulmonary complications. Patient provides understanding and gave verbal  consent to proceed. -If EGD is negative can proceed with VCE to evaluate further.   Thank you for your kind consultation, we will continue to follow.         HPI:   [name] is a 51 y.o. male with past medical history  significant for insulin-dependent type 2 diabetes peripheral neuropathy and CKD stage III, hypertension, hyperlipidemia, history of tibial artery occlusion 2018 seen by Dr. 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  04/08/2021 screening colonoscopy with Dr. Hanley Seamen looping, good bowel prep, normal colonoscopy recall 10 years.  Recent cellulitis and abscess of left foot complicated by osteomyelitis with 12 weeks of antibiotic therapy with cefepime/daptomycin followed by 6 weeks of linezolid/levofloxacin who was sent by the hyperbaric chamber to the emergency department after his hemoglobin level was 6.4 g/dL. In the ER patient was hypertensive blood pressure 179/100, afebrile, normal pulse respirations 97% on room air. CBC showed WBC 6.9, afebrile, hemodynamically stable evidence of hypertension on admission. hemoglobin 6.4 on admission, 10/17/2021 hemoglobin 9.4, baseline appears to be 12 10/07/2021.  Iron 110, saturations 44, B12 773, ferritin 340.  Pending retic count Platelets 230.  INR 1.1, no elevation of LFTs. BUN 23, creatinine 1.42, yesterday patient had elevation of 27 of BUN, creatinine 1.70-appears to be close to baseline at this time, no acute elevation of BUN. Troponin and BNP negative.  Chest x-ray unremarkable and PICC line right upper extremity.  Patient said he has never had anything like this happen before, has been told he has been anemic and iron deficient for years and has been on iron twice daily without fail. Patient's been doing 12-week antibiotic treatment, IV PICC line to 6 weeks of oral antibiotics, states with starting the oral antibiotics with taking this with his vitamins he was having some " stomach griping"  but otherwise denies any GI symptoms. Denies nausea, vomiting, reflux.  Denies any dysphagia or odynophagia.  Denies any abdominal pain. Has been having bowel movement more frequently with antibiotic use, 3-4 daily formed without straining.  Denies hematochezia or melena.  States he has darker stools with the antibiotics but they have not been tarry or sticky. For the past 3 weeks he states he has had a little bit more dyspnea with exertion and increased heart rate but denies chest pain, palpitations, diaphoresis, dizziness, fever or chills. Denies any any easy bruising or bleeding. Denies NSAID use, alcohol. Denies family history of throat, stomach cancer, stomach ulcers.  Abnormal ED labs: Abnormal Labs Reviewed  COMPREHENSIVE METABOLIC PANEL - Abnormal; Notable for the following components:      Result Value   Glucose, Bld 143 (*)    BUN 23 (*)    Creatinine, Ser 1.42 (*)    GFR, Estimated 60 (*)    All other components within normal limits  CBC - Abnormal; Notable for the following components:   RBC 2.49 (*)    Hemoglobin 6.4 (*)    HCT 20.0 (*)    MCH 25.7 (*)    All other components within normal limits  IRON AND TIBC - Abnormal; Notable for the following components:   Saturation Ratios 44 (*)    All other components within normal limits  FERRITIN - Abnormal; Notable for the following components:   Ferritin 340 (*)    All other components within normal  limits     Past Medical History:  Diagnosis Date   Cellulitis and abscess of foot    05/ 2021 left   CKD (chronic kidney disease), stage III ([place]) 06-17-2019 per pt last visit 05-29-2016 (note in care everywhere)  currently followed by pcp   nephrologist-  dr [name] ([facility] in [place])     Diabetic peripheral neuropathy ([place])    Elevated LFTs    followed by pcp   Hypertension    followed by pcp---- (06-17-2019 pt stated never had a stress test)   Iron deficiency anemia    Mixed hyperlipidemia     Pituitary microadenoma ([place]) 05/26/2001   Prolactinoma, 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   (06-17-2019 pt stated has been stable with no changes and followed by pcp)   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[place])    11-06-2016  lower extremity angiography (dr [name])  mil tibial disease w/ occlusion of the distal peroneal artery and dorsalis pedis artery but has widely patent posterior tibial and anterior tibial arterties   Type 2 diabetes mellitus treated with insulin ([place])    followed by pcp   (06-17-2019  pt stated checks blood sugar daily in am,  fasting sugar-- average 71)   Vitamin B 12 deficiency     Surgical History:  He  has a past surgical history that includes wisdom teeth extracted (1984); Anal fissure repair (2000); Amputation (Right, 07/31/2016); Amputation (Right, 08/08/2016); Incision and drainage of wound (Right, 08/13/2016); Application of a-cell of extremity (Right, 08/13/2016); Circumcision (2006); I & D extremity (Right, 09/18/2016); Application if wound vac (Right, 09/18/2016); Lower Extremity Angiography (N/A, 10/27/2016); Mass excision (Right, 10/30/2016); Application if wound vac (Right, 10/30/2016); Skin split graft (Right, 12/01/2016); Application if wound vac (Right, 12/01/2016); I & D extremity (Right, 03/11/2017); Mass excision (Right, 07/16/2017); Application of a-cell of extremity (Right, 07/16/2017); Debridement  foot (Right, 07/16/2017); Bone biopsy (Left, 01/04/2018); Metatarsal head excision (Left, 01/04/2018); Laparoscopic cholecystectomy (09-09-2006   dr Zella Richer); Amputation toe (Left, 06/21/2019); IR PATIENT EVAL TECH 0-60 MINS (01/12/2020); Amputation toe (Right, 09/03/2020); Wound debridement (Right, 09/03/2020); Bone biopsy (Right, 09/03/2020); and Colonoscopy (04/08/2021). Family History:  His family history includes Congenital heart disease in his paternal grandmother; Diabetes in his father and paternal grandmother; Healthy in his daughter; Hypertension in his father and sister;  Sarcoidosis in his mother; Stroke in his father. Social History:   reports that he has never smoked. He has never used smokeless tobacco. He reports that he does not drink alcohol and does not use drugs.  Prior to Admission medications   Medication Sig Start Date End Date Taking? Authorizing Provider  ascorbic acid (VITAMIN C) 500 MG tablet Take 500 mg by mouth daily. 08/14/16  Yes [provider]  aspirin EC 81 MG tablet Take 81 mg by mouth daily.   Yes [provider]  atorvastatin (LIPITOR) 80 MG tablet Take 1 tablet (80 mg total) by mouth daily. 05/16/21  Yes Marin Olp, MD  Cyanocobalamin (VITAMIN B-12) 5000 MCG SUBL Place 5,000 mcg under the tongue daily.   Yes [provider]  fenofibrate (TRICOR) 48 MG tablet TAKE 1 TABLET BY MOUTH EVERY DAY Patient taking differently: Take 48 mg by mouth daily. 07/31/21  Yes Marin Olp, MD  hydrochlorothiazide (MICROZIDE) 12.5 MG capsule TAKE 1 CAPSULE BY MOUTH EVERY DAY IN THE MORNING Patient taking differently: Take 12.5 mg by mouth daily. TAKE 1 CAPSULE BY MOUTH EVERY DAY IN THE MORNING 10/16/21  Yes Marin Olp, MD  insulin aspart (NOVOLOG) 100 UNIT/ML FlexPen Inject 15-25 Units into the skin 3 (three) times daily with meals. Please provide pen needles if possible Patient taking differently: Inject 15-25 Units into the skin 3 (three) times daily with meals. Per sliding scale 05/15/21  Yes Marin Olp, MD  insulin degludec (TRESIBA FLEXTOUCH) 200 UNIT/ML FlexTouch Pen Inject 80-90 Units into the skin daily. Patient taking differently: Inject 80-90 Units into the skin daily. Per sliding scale 09/02/21  Yes Marin Olp, MD  JANUVIA 50 MG tablet TAKE 1 TABLET BY MOUTH EVERY DAY IN THE MORNING Patient taking differently: Take 50 mg by mouth daily. 10/25/21  Yes Marin Olp, MD  Multiple Vitamin (MULTIVITAMIN WITH MINERALS) TABS tablet Take 1 tablet by mouth 2 (two) times daily.   Yes [provider]  Omega-3 Fatty Acids (FISH OIL) 1360 MG CAPS Take 1,360 mg by mouth daily.    Yes [provider]  pyridoxine (B-6) 100 MG tablet Take 100 mg by mouth 2 (two) times daily.   Yes [provider]  ramipril (ALTACE) 5 MG capsule TAKE 1 CAPSULE BY MOUTH EVERY DAY IN THE MORNING Patient taking differently: Take 5 mg by mouth daily. 07/17/21  Yes Marin Olp, MD  amoxicillin-clavulanate (AUGMENTIN) 875-125 MG tablet Take 1 tablet by mouth 2 (two) times daily. Patient not taking: Reported on 11/06/2021 11/05/21 12/17/21  Mignon Pine, DO  glucose blood (ACCU-CHEK GUIDE) test strip Dx E11.49 - Use to check blood glucose three times daily or as directed. 01/30/20   Marin Olp, MD  Insulin Syringe-Needle U-100 (BD INSULIN SYRINGE U/F) 31G X 5/16" 0.5 ML MISC Use to inject insulin 4 times daily. DX:E11.9 05/24/21   Marin Olp, MD  Insulin Syringe-Needle U-100 (BD INSULIN SYRINGE U/F) 31G X 5/16" 1 ML MISC Use to inject tresiba/long acting insulin once daily 06/24/21   Marin Olp, MD  Insulin Syringe-Needle U-100 (BD VEO INSULIN SYR U/F 1/2UNIT) 31G X 15/64" 0.3 ML MISC USE TO INJECT INSULIN FOUR TIMES DAILY AS DIRECTED 10/14/19   Marin Olp, MD  Lancets [facility] ULTRASOFT) lancets Use to test blood sugars daily. Dx: E11.9 04/11/20   Marin Olp, MD  Lancets MISC USED TO CHECK BLOOD GLUCOSE 3-4 TIMES DAILY 10/15/18   Marin Olp, MD    Current Facility-Administered Medications  Medication Dose Route Frequency Provider Last Rate Last Admin   0.9 %  sodium chloride infusion  10 mL/hr Intravenous Once Rancour, Annie Main, MD       acetaminophen (TYLENOL) tablet 650 mg  650 mg Oral Q6H PRN Reubin Milan, MD       Or   acetaminophen (TYLENOL) suppository 650 mg  650 mg Rectal Q6H PRN Reubin Milan, MD       amoxicillin-clavulanate (AUGMENTIN) 875-125 MG per tablet 1 tablet  1 tablet Oral BID Reubin Milan, MD       [START  ON 11/07/2021] aspirin EC tablet 81 mg  81 mg Oral Daily Reubin Milan, MD       [START ON 11/07/2021] atorvastatin (LIPITOR) tablet 80 mg  80 mg Oral Daily Reubin Milan, MD       [START ON 11/07/2021] fenofibrate tablet 54 mg  54 mg Oral Daily Reubin Milan, MD       [START ON 11/07/2021] hydrochlorothiazide (MICROZIDE) capsule 12.5 mg  12.5 mg Oral Daily Reubin Milan, MD       insulin aspart (novoLOG)  injection 0-20 Units  0-20 Units Subcutaneous TID WC Reubin Milan, MD       [START ON 11/07/2021] insulin detemir (LEVEMIR) injection 60 Units  60 Units Subcutaneous Daily Reubin Milan, MD       [START ON 11/07/2021] linagliptin (TRADJENTA) tablet 5 mg  5 mg Oral Daily Reubin Milan, MD       ondansetron [facility]) tablet 4 mg  4 mg Oral Q6H PRN Reubin Milan, MD       Or   ondansetron [facility]) injection 4 mg  4 mg Intravenous Q6H PRN Reubin Milan, MD       pantoprazole (PROTONIX) injection 40 mg  40 mg Intravenous Q12H Reubin Milan, MD       pyridOXINE (VITAMIN B6) tablet 100 mg  100 mg Oral BID Reubin Milan, MD       [START ON 11/07/2021] ramipril (ALTACE) capsule 5 mg  5 mg Oral Daily Reubin Milan, MD       [START ON 11/07/2021] Vitamin B-12 SUBL 5,000 mcg  5,000 mcg Sublingual Daily Reubin Milan, MD       Current Outpatient Medications  Medication Sig Dispense Refill   ascorbic acid (VITAMIN C) 500 MG tablet Take 500 mg by mouth daily.     aspirin EC 81 MG tablet Take 81 mg by mouth daily.     atorvastatin (LIPITOR) 80 MG tablet Take 1 tablet (80 mg total) by mouth daily. 90 tablet 3   Cyanocobalamin (VITAMIN B-12) 5000 MCG SUBL Place 5,000 mcg under the tongue daily.     fenofibrate (TRICOR) 48 MG tablet TAKE 1 TABLET BY MOUTH EVERY DAY (Patient taking differently: Take 48 mg by mouth daily.) 90 tablet 4   hydrochlorothiazide (MICROZIDE) 12.5 MG capsule TAKE 1 CAPSULE BY MOUTH EVERY DAY IN THE MORNING  (Patient taking differently: Take 12.5 mg by mouth daily. TAKE 1 CAPSULE BY MOUTH EVERY DAY IN THE MORNING) 100 capsule 3   insulin aspart (NOVOLOG) 100 UNIT/ML FlexPen Inject 15-25 Units into the skin 3 (three) times daily with meals. Please provide pen needles if possible (Patient taking differently: Inject 15-25 Units into the skin 3 (three) times daily with meals. Per sliding scale) 45 mL 3   insulin degludec (TRESIBA FLEXTOUCH) 200 UNIT/ML FlexTouch Pen Inject 80-90 Units into the skin daily. (Patient taking differently: Inject 80-90 Units into the skin daily. Per sliding scale) 15 mL 3   JANUVIA 50 MG tablet TAKE 1 TABLET BY MOUTH EVERY DAY IN THE MORNING (Patient taking differently: Take 50 mg by mouth daily.) 90 tablet 1   Multiple Vitamin (MULTIVITAMIN WITH MINERALS) TABS tablet Take 1 tablet by mouth 2 (two) times daily.     Omega-3 Fatty Acids (FISH OIL) 1360 MG CAPS Take 1,360 mg by mouth daily.      pyridoxine (B-6) 100 MG tablet Take 100 mg by mouth 2 (two) times daily.     ramipril (ALTACE) 5 MG capsule TAKE 1 CAPSULE BY MOUTH EVERY DAY IN THE MORNING (Patient taking differently: Take 5 mg by mouth daily.) 90 capsule 4   amoxicillin-clavulanate (AUGMENTIN) 875-125 MG tablet Take 1 tablet by mouth 2 (two) times daily. (Patient not taking: Reported on 11/06/2021) 84 tablet 0   glucose blood (ACCU-CHEK GUIDE) test strip Dx E11.49 - Use to check blood glucose three times daily or as directed. 300 each 1   Insulin Syringe-Needle U-100 (BD INSULIN SYRINGE U/F) 31G X 5/16" 0.5 ML MISC Use  to inject insulin 4 times daily. DX:E11.9 100 each 4   Insulin Syringe-Needle U-100 (BD INSULIN SYRINGE U/F) 31G X 5/16" 1 ML MISC Use to inject tresiba/long acting insulin once daily 100 each 3   Insulin Syringe-Needle U-100 (BD VEO INSULIN SYR U/F 1/2UNIT) 31G X 15/64" 0.3 ML MISC USE TO INJECT INSULIN FOUR TIMES DAILY AS DIRECTED 300 each 2   Lancets (ONETOUCH ULTRASOFT) lancets Use to test blood sugars  daily. Dx: E11.9 100 each 12   Lancets MISC USED TO CHECK BLOOD GLUCOSE 3-4 TIMES DAILY 200 each 3    Allergies as of 11/06/2021   (No Known Allergies)    Review of Systems:    Constitutional: No weight loss, fever, chills, weakness or fatigue HEENT: Eyes: No change in vision               Ears, Nose, Throat:  No change in hearing or congestion Skin: No rash or itching Cardiovascular: No chest pain, chest pressure or palpitations   Respiratory: No SOB or cough Gastrointestinal: See HPI and otherwise negative Genitourinary: No dysuria or change in urinary frequency Neurological: No headache, dizziness or syncope Musculoskeletal: No new muscle or joint pain Hematologic: No bleeding or bruising Psychiatric: No history of depression or anxiety     Physical Exam:  Vital signs in last 24 hours: Temp:  [98 F (36.7 C)-98.5 F (36.9 C)] 98 F (36.7 C) (10/18 1448) Pulse Rate:  [80-103] 84 (10/18 1448) Resp:  [9-20] 16 (10/18 1448) BP: (136-179)/(75-100) 136/75 (10/18 1239) SpO2:  [96 %-100 %] 100 % (10/18 1448) Weight:  [148.8 kg] 148.8 kg (10/18 1046)   Last BM recorded by nurses in past 5 days No data recorded  General:   Pleasant, obese male in no acute distress Head:  Normocephalic and atraumatic. Eyes: sclerae anicteric,conjunctive pale  Heart:  regular rate and rhythm, no murmurs or gallops Pulm: Clear anteriorly; no wheezing Abdomen:  Soft, Obese AB, Active bowel sounds. No tenderness . , No organomegaly appreciated. Extremities: Right fourth and fifth metatarsal amputation, mild edema, medial malleolus slightly enlarged but nontender. Msk:  Symmetrical without gross deformities. Peripheral pulses intact.  Neurologic:  Alert and  oriented x4;  No focal deficits.  Skin:   Dry and intact without significant lesions or rashes. Psychiatric:  Cooperative. Normal mood and affect.  LAB RESULTS: Recent Labs    11/05/21 1116 11/06/21 1217  WBC 6.3 6.9  HGB 6.7* 6.4*  HCT  20.7* 20.0*  PLT 248 230   BMET Recent Labs    11/05/21 1116 11/06/21 1217  NA 142 142  K 4.2 3.8  CL 108 110  CO2 28 27  GLUCOSE 170* 143*  BUN 27* 23*  CREATININE 1.70* 1.42*  CALCIUM 9.6 9.5   LFT Recent Labs    11/06/21 1217  PROT 7.5  ALBUMIN 3.8  AST 19  ALT 24  ALKPHOS 51  BILITOT 0.9   PT/INR Recent Labs    11/06/21 1341  LABPROT 13.9  INR 1.1    STUDIES: No results found.   Vladimir Crofts  11/06/2021, 3:18 PM

## 2021-11-06 NOTE — Telephone Encounter (Signed)
Spoke with patient care center at [facility], they are unable to do a blood transfusion today. Patient will need to go to ED.  Beryle Flock, RN

## 2021-11-06 NOTE — ED Triage Notes (Addendum)
Patient sent over from the hyperbaric chamber. He said he got blood work done yesterday and his hemoglobin was 6.7. Stated he has never had a low blood count before. Only symptoms he has noticed was feeling more winded than normal as he has looked back upon it. Takes baby aspirin every day. Said his stools have been darker than normal.

## 2021-11-06 NOTE — Telephone Encounter (Signed)
Patient's spouse, Lelan Pons [facility]), called back. Relayed to her that Zacchaeus will need to go to the emergency department for a blood transfusion as soon as possible for hemoglobin of 6.7 which may be related to linezolid that he finished Sunday.   Unable to talk to the patient at this time as he is currently in the hyperbaric chamber. Lelan Pons is calling the wound center now to notify them that Chigozie will need to go to the ED. Asked her to please call back with any questions or concerns.   Beryle Flock, RN

## 2021-11-07 ENCOUNTER — Observation Stay (HOSPITAL_COMMUNITY): Payer: BLUE CROSS/BLUE SHIELD | Admitting: Certified Registered Nurse Anesthetist

## 2021-11-07 ENCOUNTER — Encounter (HOSPITAL_COMMUNITY): Payer: Self-pay

## 2021-11-07 ENCOUNTER — Encounter (HOSPITAL_COMMUNITY)
Admission: EM | Disposition: A | Discharge status: Home / Self Care | Payer: Self-pay | Source: Home / Self Care | Attending: Emergency Medicine

## 2021-11-07 ENCOUNTER — Encounter (HOSPITAL_BASED_OUTPATIENT_CLINIC_OR_DEPARTMENT_OTHER): Payer: BLUE CROSS/BLUE SHIELD | Admitting: Internal Medicine

## 2021-11-07 DIAGNOSIS — D631 Anemia in chronic kidney disease: Secondary | ICD-10-CM | POA: Diagnosis not present

## 2021-11-07 DIAGNOSIS — I739 Peripheral vascular disease, unspecified: Secondary | ICD-10-CM

## 2021-11-07 DIAGNOSIS — I129 Hypertensive chronic kidney disease with stage 1 through stage 4 chronic kidney disease, or unspecified chronic kidney disease: Secondary | ICD-10-CM

## 2021-11-07 DIAGNOSIS — D352 Benign neoplasm of pituitary gland: Secondary | ICD-10-CM

## 2021-11-07 DIAGNOSIS — N182 Chronic kidney disease, stage 2 (mild): Secondary | ICD-10-CM | POA: Diagnosis not present

## 2021-11-07 DIAGNOSIS — K921 Melena: Secondary | ICD-10-CM

## 2021-11-07 DIAGNOSIS — L97512 Non-pressure chronic ulcer of other part of right foot with fat layer exposed: Secondary | ICD-10-CM

## 2021-11-07 DIAGNOSIS — E1142 Type 2 diabetes mellitus with diabetic polyneuropathy: Secondary | ICD-10-CM

## 2021-11-07 DIAGNOSIS — D649 Anemia, unspecified: Secondary | ICD-10-CM | POA: Diagnosis not present

## 2021-11-07 DIAGNOSIS — E08621 Diabetes mellitus due to underlying condition with foot ulcer: Secondary | ICD-10-CM | POA: Diagnosis not present

## 2021-11-07 DIAGNOSIS — I10 Essential (primary) hypertension: Secondary | ICD-10-CM

## 2021-11-07 HISTORY — PX: ESOPHAGOGASTRODUODENOSCOPY (EGD) WITH PROPOFOL: SHX5813

## 2021-11-07 HISTORY — PX: GIVENS CAPSULE STUDY: SHX5432

## 2021-11-07 LAB — CBC
HCT: 22.8 % — ABNORMAL LOW (ref 39.0–52.0)
Hemoglobin: 7.6 g/dL — ABNORMAL LOW (ref 13.0–17.0)
MCH: 26.9 pg (ref 26.0–34.0)
MCHC: 33.3 g/dL (ref 30.0–36.0)
MCV: 80.6 fL (ref 80.0–100.0)
Platelets: 216 10*3/uL (ref 150–400)
RBC: 2.83 MIL/uL — ABNORMAL LOW (ref 4.22–5.81)
RDW: 13.3 % (ref 11.5–15.5)
WBC: 7.9 10*3/uL (ref 4.0–10.5)
nRBC: 0 % (ref 0.0–0.2)

## 2021-11-07 LAB — TYPE AND SCREEN
ABO/RH(D): B NEG
Antibody Screen: NEGATIVE
Unit division: 0
Unit division: 0

## 2021-11-07 LAB — GLUCOSE, CAPILLARY
Glucose-Capillary: 109 mg/dL — ABNORMAL HIGH (ref 70–99)
Glucose-Capillary: 102 mg/dL — ABNORMAL HIGH (ref 70–99)
Glucose-Capillary: 91 mg/dL (ref 70–99)
Glucose-Capillary: 85 mg/dL (ref 70–99)

## 2021-11-07 LAB — BILIRUBIN, FRACTIONATED(TOT/DIR/INDIR)
Bilirubin, Direct: 0.3 mg/dL — ABNORMAL HIGH (ref 0.0–0.2)
Indirect Bilirubin: 1.3 mg/dL — ABNORMAL HIGH (ref 0.3–0.9)
Total Bilirubin: 1.6 mg/dL — ABNORMAL HIGH (ref 0.3–1.2)

## 2021-11-07 LAB — BPAM RBC
Blood Product Expiration Date: 202310312359
Blood Product Expiration Date: 202311012359
ISSUE DATE / TIME: 202310182008
ISSUE DATE / TIME: 202310182314
Unit Type and Rh: 1700
Unit Type and Rh: 1700

## 2021-11-07 LAB — COMPREHENSIVE METABOLIC PANEL
ALT: 20 U/L (ref 0–44)
AST: 15 U/L (ref 15–41)
Albumin: 3.4 g/dL — ABNORMAL LOW (ref 3.5–5.0)
Alkaline Phosphatase: 43 U/L (ref 38–126)
Anion gap: 5 (ref 5–15)
BUN: 17 mg/dL (ref 6–20)
CO2: 24 mmol/L (ref 22–32)
Calcium: 8.9 mg/dL (ref 8.9–10.3)
Chloride: 109 mmol/L (ref 98–111)
Creatinine, Ser: 1.22 mg/dL (ref 0.61–1.24)
GFR, Estimated: >60 mL/min (ref >60)
Glucose, Bld: 112 mg/dL — ABNORMAL HIGH (ref 70–99)
Potassium: 3.7 mmol/L (ref 3.5–5.1)
Sodium: 138 mmol/L (ref 135–145)
Total Bilirubin: 1.9 mg/dL — ABNORMAL HIGH (ref 0.3–1.2)
Total Protein: 6.4 g/dL — ABNORMAL LOW (ref 6.5–8.1)

## 2021-11-07 LAB — LACTATE DEHYDROGENASE: LDH: 129 U/L (ref 98–192)

## 2021-11-07 SURGERY — ESOPHAGOGASTRODUODENOSCOPY (EGD) WITH PROPOFOL
Anesthesia: Monitor Anesthesia Care

## 2021-11-07 MED ORDER — GENTAMICIN SULFATE 0.1 % EX OINT
TOPICAL_OINTMENT | CUTANEOUS | Status: DC
  Filled 2021-11-07: qty 15

## 2021-11-07 MED ORDER — PROPOFOL 500 MG/50ML IV EMUL
INTRAVENOUS | Status: AC
  Filled 2021-11-07: qty 50

## 2021-11-07 MED ORDER — PROPOFOL 10 MG/ML IV BOLUS
INTRAVENOUS | Status: DC | PRN
  Administered 2021-11-07 (×3): 20 mg via INTRAVENOUS

## 2021-11-07 MED ORDER — PROPOFOL 10 MG/ML IV BOLUS
INTRAVENOUS | Status: AC
  Filled 2021-11-07: qty 20

## 2021-11-07 MED ORDER — LIDOCAINE HCL (CARDIAC) PF 100 MG/5ML IV SOSY
PREFILLED_SYRINGE | INTRAVENOUS | Status: DC | PRN
  Administered 2021-11-07: 100 mg via INTRAVENOUS

## 2021-11-07 MED ORDER — PROPOFOL 500 MG/50ML IV EMUL
INTRAVENOUS | Status: DC | PRN
  Administered 2021-11-07: 110 ug/kg/min via INTRAVENOUS

## 2021-11-07 MED ORDER — GENTAMICIN SULFATE 0.1 % EX OINT
TOPICAL_OINTMENT | CUTANEOUS | Status: DC
  Filled 2021-11-07 (×2): qty 15

## 2021-11-07 MED ORDER — GENTAMICIN SULFATE 0.1 % EX CREA
TOPICAL_CREAM | CUTANEOUS | Status: DC
  Filled 2021-11-07: qty 15

## 2021-11-07 MED ORDER — SODIUM CHLORIDE 0.9 % IV SOLN: INTRAVENOUS | Status: DC

## 2021-11-07 MED ORDER — SODIUM CHLORIDE 0.9 % IV SOLN
INTRAVENOUS | Status: AC | PRN
  Administered 2021-11-07: 500 mL via INTRAVENOUS

## 2021-11-07 MED ORDER — INFLUENZA VAC SPLIT QUAD 0.5 ML IM SUSY
0.5000 mL | PREFILLED_SYRINGE | INTRAMUSCULAR | Status: AC
  Administered 2021-11-08: 0.5 mL via INTRAMUSCULAR
  Filled 2021-11-07: qty 0.5

## 2021-11-07 MED ORDER — PANTOPRAZOLE SODIUM 40 MG PO TBEC
40.0000 mg | DELAYED_RELEASE_TABLET | Freq: Every day | ORAL | Status: DC
  Administered 2021-11-07 – 2021-11-08 (×2): 40 mg via ORAL
  Filled 2021-11-07 (×2): qty 1

## 2021-11-07 SURGICAL SUPPLY — 15 items

## 2021-11-07 NOTE — Assessment & Plan Note (Signed)
No symptoms, described in the prior notes as prolactinoma.

## 2021-11-07 NOTE — Anesthesia Preprocedure Evaluation (Addendum)
Anesthesia Evaluation  Patient identified by MRN, date of birth, ID band Patient awake    Reviewed: Allergy & Precautions, NPO status , Patient's Chart, lab work & pertinent test results  History of Anesthesia Complications Negative for: history of anesthetic complications  Airway Mallampati: IV  TM Distance: >3 FB Neck ROM: Full    Dental  (+) Dental Advisory Given, Teeth Intact   Pulmonary neg pulmonary ROS,    breath sounds clear to auscultation       Cardiovascular hypertension, Pt. on medications + Peripheral Vascular Disease   Rhythm:Regular     Neuro/Psych  Neuromuscular disease negative psych ROS   GI/Hepatic negative GI ROS, Neg liver ROS,   Endo/Other  diabetes, Type 2, Insulin Dependent, Oral Hypoglycemic AgentsMorbid obesity  Renal/GU Renal InsufficiencyRenal disease     Musculoskeletal  (+) Arthritis ,   Abdominal   Peds  Hematology  (+) Blood dyscrasia, anemia , Lab Results      Component                Value               Date                      WBC                      7.9                 11/07/2021                HGB                      7.6 (L)             11/07/2021                HCT                      22.8 (L)            11/07/2021                MCV                      80.6                11/07/2021                PLT                      216                 11/07/2021              Anesthesia Other Findings   Reproductive/Obstetrics                            Anesthesia Physical Anesthesia Plan  ASA: 3  Anesthesia Plan: MAC   Post-op Pain Management: Minimal or no pain anticipated   Induction: Intravenous  PONV Risk Score and Plan: 1 and Propofol infusion and Treatment may vary due to age or medical condition  Airway Management Planned: Nasal Cannula and Natural Airway  Additional Equipment: None  Intra-op Plan:   Post-operative Plan:   Informed  Consent: I have reviewed the patients History and Physical, chart, labs and discussed the  procedure including the risks, benefits and alternatives for the proposed anesthesia with the patient or authorized representative who has indicated his/her understanding and acceptance.     Dental advisory given  Plan Discussed with: CRNA  Anesthesia Plan Comments:         Anesthesia Quick Evaluation

## 2021-11-07 NOTE — Consult Note (Addendum)
[place] Nurse Consult Note: Reason for Consult: Consult requested for right foot wound.  Pt is followed at the outpatient wound care center prior to admission for a chronic full thickness wound.  He has been receiving hyperbaric treatments and using Gentamycin ointment and iodoform packing Q day. He was just seen at that location yesterday and they assessed the wound and it continues to improve, according to the patient.   Dressing procedure/placement/frequency: Continue plan of care.  Topical treatment orders provided for bedside nurse to perform as follows: Apply Gentamycin ointment and iodoform packing to right foot wound Q day, using swab to fill, then cover with  gauze and kerlex.  Pt plans to continue to follow-up with the outpatient wound care center after discharge. Please re-consult if further assistance is needed.  Thank-you,  Julien Girt MSN, [place], [place], [place], [place]

## 2021-11-07 NOTE — Op Note (Signed)
[facility] Patient Name: [name] Procedure Date: 11/07/2021 MRN: [id number] Attending MD: Gerrit Heck , MD Date of Birth: Sep 23, 1970 CSN: [id number] Age: 51 Admit Type: Inpatient Procedure:                Upper GI endoscopy Indications:              Acute post hemorrhagic anemia superimposed on                            chronic anemia, Melena                           51 year old male presents with melena and                            symptomatic acute on chronic anemia with admission                            H/H 6.4/20 with normal iron indices. Comparison                            labs from 09/2018/2023 with H/H 9.4/29.3, and                            baseline Hgb ~12. Good response to 2 unit PRBC                            transfusion overnight and started on high-dose PPI. Providers:                Gerrit Heck, MD, Benay Pillow, RN, Benetta Spar, Technician Referring MD:              Medicines:                Monitored Anesthesia Care Complications:            No immediate complications. Estimated Blood Loss:     Estimated blood loss: none. Procedure:                Pre-Anesthesia Assessment:                           - Prior to the procedure, a History and Physical                            was performed, and patient medications and                            allergies were reviewed. The patient's tolerance of                            previous anesthesia was also reviewed. The risks                            and benefits of the  procedure and the sedation                            options and risks were discussed with the patient.                            All questions were answered, and informed consent                            was obtained. Prior Anticoagulants: The patient has                            taken no previous anticoagulant or antiplatelet                            agents. ASA Grade  Assessment: III - A patient with                            severe systemic disease. After reviewing the risks                            and benefits, the patient was deemed in                            satisfactory condition to undergo the procedure.                           After obtaining informed consent, the endoscope was                            passed under direct vision. Throughout the                            procedure, the patient's blood pressure, pulse, and                            oxygen saturations were monitored continuously. The                            GIF-H190 ([id number]) Olympus endoscope was introduced                            through the mouth, and advanced to the second part                            of duodenum. The upper GI endoscopy was                            accomplished without difficulty. The patient                            tolerated the procedure well. Scope In: Scope Out: Findings:      The examined esophagus was normal.      The entire examined  stomach was normal.      The examined duodenum was normal. Impression:               - Normal esophagus.                           - Normal stomach.                           - Normal examined duodenum.                           - I elected against mucosal biopsies in favor of                            further small bowel interrogation with Video                            Capsule Endoscopy and not wanting to complicate                            those images with possible post biopsy bleed or                            biopsy ulcer.                           - No specimens collected. Moderate Sedation:      Not Applicable - Patient had care per Anesthesia. Recommendation:           - Return patient to hospital ward for ongoing care.                           - Continue NPO for now until VCE placement.                           - Continue present medications.                           - To  visualize the small bowel, perform video                            capsule endoscopy today. After VCE placement, NPO                            x2 hours, then restart clears and slowly advance                            per VCE protocol.                           - Continue serial CBC checks with additional blood                            products as needed per protocol. Procedure Code(s):        --- Professional ---  74163, Esophagogastroduodenoscopy, flexible,                            transoral; diagnostic, including collection of                            specimen(s) by brushing or washing, when performed                            (separate procedure) Diagnosis Code(s):        --- Professional ---                           D62, Acute posthemorrhagic anemia                           K92.1, Melena (includes Hematochezia) CPT copyright 2019 American Medical Association. All rights reserved. The codes documented in this report are preliminary and upon coder review may  be revised to meet current compliance requirements. Gerrit Heck, MD 11/07/2021 1:00:29 PM Number of Addenda: 0

## 2021-11-07 NOTE — Assessment & Plan Note (Signed)
-   Continue atorvastatin and fenofibrate

## 2021-11-07 NOTE — Progress Notes (Signed)
Pill came swallowed at 1540 with no difficulties. Pt an Therapist, sports given verbal instructions. Written instruction left at the patients beside. Pt and RN verbalize understanding.

## 2021-11-07 NOTE — Transfer of Care (Signed)
Immediate Anesthesia Transfer of Care Note  Patient: Torie Priebe [name]  Procedure(s) Performed: ESOPHAGOGASTRODUODENOSCOPY (EGD) WITH PROPOFOL  Patient Location: Endoscopy Unit  Anesthesia Type:MAC  Level of Consciousness: awake, alert , oriented, drowsy and patient cooperative  Airway & Oxygen Therapy: Patient Spontanous Breathing and Patient connected to face mask oxygen  Post-op Assessment: Report given to RN and Post -op Vital signs reviewed and stable  Post vital signs: Reviewed and stable  Last Vitals:  Vitals Value Taken Time  BP 140/81 11/07/21 1253  Temp 37.1 C 11/07/21 1253  Pulse 81 11/07/21 1256  Resp 18 11/07/21 1256  SpO2 100 % 11/07/21 1256  Vitals shown include unvalidated device data.  Last Pain:  Vitals:   11/07/21 1253  TempSrc: Temporal  PainSc: 0-No pain         Complications: No notable events documented.

## 2021-11-07 NOTE — Assessment & Plan Note (Addendum)
Transfused 2 units yesterday.  Retic ct pct low, hypoproliferative, which seems incongruous with acute blood loss and with his iron and B12 replete labs.  Should keep in the differential that this is from non-GI loss, such as from the Linezolid.  Certainly his renal dysfunction and chronic inflammatory state (from osteo) are contributing to anemia.  - Trend CBC - Transfusion threshold 7 g/dL

## 2021-11-07 NOTE — Hospital Course (Addendum)
[name] is a 51 y.o. 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 Juleen China, recently transitioned to oral suppression with Augmentin who presented with abnormal labs.  Recently noticed more fatigue.  In hyperbaric center day before admission, had routine labs that showed Hgb 6.7 g/dL.  On probing, reported he had maybe noticed "dark stools" more than normal.   10/18: Admitted on PPI, GI consulted; transfused 2 units 10/19: EGD showed no acute findings; VCE started

## 2021-11-07 NOTE — Interval H&P Note (Signed)
History and Physical Interval Note:  No acute events overnight.  Denies any overt bleeding.  Transfused 2 unit PRBCs with posttransfusion H/H 7.6/22.8.  Creatinine improved to 1.22.  Plan for EGD today for diagnostic and therapeutic intent.  If EGD negative, discussed VCE placement afterwards for small bowel interrogation.  11/07/2021 12:24 PM  [name]  has presented today for surgery, with the diagnosis of melena, anemia.  The various methods of treatment have been discussed with the patient and family. After consideration of risks, benefits and other options for treatment, the patient has consented to  Procedure(s): ESOPHAGOGASTRODUODENOSCOPY (EGD) WITH PROPOFOL (N/A) as a surgical intervention.  The patient's history has been reviewed, patient examined, no change in status, stable for surgery.  I have reviewed the patient's chart and labs.  Questions were answered to the patient's satisfaction.     [name] [name]

## 2021-11-07 NOTE — Assessment & Plan Note (Signed)
-   Continue aspirin, atorvastatin, fibric, ACE inhibitor

## 2021-11-07 NOTE — Assessment & Plan Note (Signed)
EGD today with normal esophagus, stomach, and duodenum. GI recommended continue current medications, presumably including PPI for now - Consult GI - Continue IV PPI - Capsule endoscopy to place tonight

## 2021-11-07 NOTE — Assessment & Plan Note (Signed)
Cr stable relative to baseline 

## 2021-11-07 NOTE — Assessment & Plan Note (Signed)
BMI 43 

## 2021-11-07 NOTE — Assessment & Plan Note (Signed)
Blood pressure controlled - Continue HCTZ and ramipril

## 2021-11-07 NOTE — Progress Notes (Signed)
  Progress Note   Patient: [name] KGM:[id number] DOB: [birth year] DOA: 11/06/2021     0 DOS: the patient was seen and examined on 11/07/2021 at 9:35 AM      Brief hospital course: [name] is a 51 y.o. 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 Juleen China, recently transitioned to oral suppression with Augmentin who presented with abnormal labs.  Recently noticed more fatigue.  In hyperbaric center day before admission, had routine labs that showed Hgb 6.7 g/dL.  On probing, reported he had maybe noticed "dark stools" more than normal.   10/18: Admitted on PPI, GI consulted; transfused 2 units 10/19: EGD showed no acute findings; VCE started     Assessment and Plan: * Symptomatic anemia Transfused 2 units yesterday.  Retic ct pct low, hypoproliferative, which seems incongruous with acute blood loss and with his iron and B12 replete labs.    Should keep in the differential that this is from non-GI loss, such as from the Linezolid.    Certainly his renal dysfunction and chronic inflammatory state (from osteo) are contributing to anemia.  - Trend CBC - Transfusion threshold 7 g/dL       Melena and acute on chronic blood loss anemia EGD today with normal esophagus, stomach, and duodenum. 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  Osteomyelitis (HCC) - Continue Augmentin  Diabetic foot ulcer (HCC) - Continue Augmentin - Pack wound daily  Diabetic neuropathy (HCC)    CKD (chronic kidney disease), stage  II Cr stable relative to baseline.  Iron deficiency anemia    Morbid obesity (HCC) BMI 43  Essential hypertension Blood pressure controlled - Continue HCTZ and ramipril  Hyperlipidemia associated with type 2 diabetes mellitus (HCC) - Continue atorvastatin and fenofibrate  Type II diabetes mellitus with neurological manifestations (HCC) Glucose controlled - Continue Levemir, Tradjenta, sliding scale corrections          Subjective: No bowel movements overnight, no vomiting.  No fever, hematuria, confusion.     Physical Exam: BP (!) 147/75   Pulse 78   Temp 98.7 F (37.1 C) (Temporal)   Resp 16   Ht 6' (1.829 m)   Wt (!) 144.4 kg   SpO2 96%   BMI 43.18 kg/m   Adult male, sitting up in recliner, drawing RRR, no murmurs, no peripheral edema Respiratory rate normal, lungs clear without rales or wheezes Abdomen soft without tenderness palpation or guarding The right foot is examined, there is no redness or drainage surrounding his packed deep wound.  There are 3 amputated toes on that foot.  Data Reviewed: Discussed with gastroenterology Particular low B12 and iron studies replete Hemoglobin up posttransfusion Platelets and white blood cell count normal Creatinine, sodium, potassium unremarkable    Family Communication: None present    Disposition: Status is: Observation GI recommend further testing with capsule endoscopy tonight        Author: Edwin Dada, MD 11/07/2021 2:58 PM  For on call review [email or web address].

## 2021-11-07 NOTE — Assessment & Plan Note (Signed)
Continue Augmentin

## 2021-11-07 NOTE — Progress Notes (Signed)
Mobility Specialist - Progress Note   11/07/21 0958  Mobility  Activity Ambulated independently in hallway  Level of Assistance Independent after set-up  Assistive Device None  Distance Ambulated (ft) 500 ft  Activity Response Tolerated well  Mobility Referral Yes  $Mobility charge 1 Mobility   Pt received in chair and agreed to mobility, no c/o pain nor discomfort during ambulation. Pt back to chair with all needs met.  Roderick Pee Mobility Specialist

## 2021-11-07 NOTE — Assessment & Plan Note (Signed)
-   Continue Augmentin - Pack wound daily

## 2021-11-07 NOTE — Assessment & Plan Note (Signed)
Glucose controlled - Continue Levemir, Tradjenta, sliding scale corrections

## 2021-11-08 ENCOUNTER — Telehealth: Payer: Self-pay | Admitting: Gastroenterology

## 2021-11-08 ENCOUNTER — Encounter (HOSPITAL_BASED_OUTPATIENT_CLINIC_OR_DEPARTMENT_OTHER): Payer: BLUE CROSS/BLUE SHIELD | Admitting: Internal Medicine

## 2021-11-08 DIAGNOSIS — D509 Iron deficiency anemia, unspecified: Secondary | ICD-10-CM

## 2021-11-08 DIAGNOSIS — M86671 Other chronic osteomyelitis, right ankle and foot: Secondary | ICD-10-CM

## 2021-11-08 DIAGNOSIS — D649 Anemia, unspecified: Secondary | ICD-10-CM | POA: Diagnosis not present

## 2021-11-08 DIAGNOSIS — K921 Melena: Secondary | ICD-10-CM

## 2021-11-08 LAB — CBC
HCT: 26.3 % — ABNORMAL LOW (ref 39.0–52.0)
Hemoglobin: 8.8 g/dL — ABNORMAL LOW (ref 13.0–17.0)
MCH: 27 pg (ref 26.0–34.0)
MCHC: 33.5 g/dL (ref 30.0–36.0)
MCV: 80.7 fL (ref 80.0–100.0)
Platelets: 224 10*3/uL (ref 150–400)
RBC: 3.26 MIL/uL — ABNORMAL LOW (ref 4.22–5.81)
RDW: 13.4 % (ref 11.5–15.5)
WBC: 8.1 10*3/uL (ref 4.0–10.5)
nRBC: 1.1 % — ABNORMAL HIGH (ref 0.0–0.2)

## 2021-11-08 LAB — GLUCOSE, CAPILLARY: Glucose-Capillary: 95 mg/dL (ref 70–99)

## 2021-11-08 LAB — MISC LABCORP TEST (SEND OUT): Labcorp test code: 83935

## 2021-11-08 LAB — HAPTOGLOBIN: Haptoglobin: 189 mg/dL (ref 29–370)

## 2021-11-08 MED ORDER — PANTOPRAZOLE SODIUM 40 MG PO TBEC: 40.0000 mg | DELAYED_RELEASE_TABLET | Freq: Every day | ORAL | 1 refills | Status: DC

## 2021-11-08 NOTE — Anesthesia Postprocedure Evaluation (Signed)
Anesthesia Post Note  Patient: [name]  Procedure(s) Performed: ESOPHAGOGASTRODUODENOSCOPY (EGD) WITH PROPOFOL GIVENS CAPSULE STUDY     Patient location during evaluation: Endoscopy Anesthesia Type: MAC Level of consciousness: awake and alert Pain management: pain level controlled Vital Signs Assessment: post-procedure vital signs reviewed and stable Respiratory status: spontaneous breathing, nonlabored ventilation and respiratory function stable Cardiovascular status: stable and blood pressure returned to baseline Postop Assessment: no apparent nausea or vomiting Anesthetic complications: no   No notable events documented.  Last Vitals:  Vitals:   11/07/21 2037 11/08/21 0542  BP: (!) 156/89 (!) 166/89  Pulse: 73 79  Resp: 20 17  Temp: 37.1 C 37.2 C  SpO2: 100% 100%    Last Pain:  Vitals:   11/08/21 1000  TempSrc:   PainSc: 0-No pain                 Chasin Findling

## 2021-11-08 NOTE — Telephone Encounter (Signed)
Video capsule endoscopy completed as inpatient and reviewed images after patient was discharged from the hospital.  VCE notable for the following:  Findings: Complete study with good bowel preparation  1.  First duodenal image at 3 hours 52 minutes 2.  First cecal image at 11 hours 15 minutes 3.  A few small, benign small bowel lymphangiectasia's noted in the mid small bowel.  Otherwise normal small bowel.  No blood throughout the entire visualized GI lumen and no stigmata of recent bleeding.  Summary and Recommendations: Normal video capsule endoscopy.  Based on the normal upper endoscopy, normal video capsule endoscopy, and FOBT negative stool, I have a higher suspicion for antibiotic induced isolated anemia (specifically recent linezolid).   - Repeat CBC in 7-10 days with PCP to ensure hemoglobin returning to baseline - Can follow-up in the GI clinic as needed - Please call patient to convey these results.  Thank you for allowing me to participate in your recent inpatient care.

## 2021-11-08 NOTE — Discharge Summary (Signed)
Physician Discharge Summary   Patient: [name] MRN: [id number] DOB: 12/09/1970  Admit date:     11/06/2021  Discharge date: 11/08/21  Discharge Physician: Edwin Dada   PCP: Marin Olp, MD     Recommendations at discharge:  Follow up with PCP Dr. Yong Channel in 1 week Dr. Yong Channel:  Pelase obtain CBC in 1 week to check Hgb in setting of possible linezolid associated anemia 3.   Follow up with GI Dr. Bryan Lemma for results of capsule endoscopy 4.   Dr. Bryan Lemma: Please advise patient of duration of treatment with pantoprazole needed 5.   Follow up with ID Dr. Juleen China as previously planned      Discharge Diagnoses: Principal Problem:   Symptomatic anemia, possibly Linezolid-associated Active Problems:   Possible melena and acute on chronic blood loss anemia   Type II diabetes mellitus with neurological manifestations (HCC)   Hyperlipidemia associated with type 2 diabetes mellitus (HCC)   Essential hypertension   Morbid obesity (HCC)   Iron deficiency anemia   CKD (chronic kidney disease), stage II   Diabetic neuropathy (HCC)   Diabetic foot ulcer (HCC)   Osteomyelitis (HCC)   Pituitary adenoma (HCC)   PVD (peripheral vascular disease) [facility])      Hospital Course: [name] is a 51 y.o. 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 Juleen China, recently transitioned to oral suppression with Augmentin who presented with abnormal labs.  Recently noticed more fatigue.  In hyperbaric center day before admission, had routine labs that showed Hgb 6.7 g/dL.  On probing, reported he had maybe noticed "dark stools" more than normal.      Assessment and Plan: * Symptomatic anemia, suspect Linezolid use FOBT negative in ER.  Reticulocytes reduced, militating against acute blood loss,  especially in setting of iron and B12 repletion (levels confirmed).  Transfused 2 units and Hgb improved.  No bleeding observed in the hospital.  GI were consulted, performed EGD which was unremarkable, and capsule study which is pending at the time of discharge.  Recommend repeat CBC in 1 week with PCP and follow-up with ID  Discussed with pharmacy and hematology.  Linezolid associated anemia will be expected to last between 1 and 4 weeks and resolve spontaneously.   Possible melena   No GI source of bleeding found    Osteomyelitis ([place]) Continued on Augmentin             The [place] Registry was reviewed for this patient prior to discharge.   Consultants: GI, Dr. Bryan Lemma Procedures performed:  - EGD normal - VCE pending at dischage  Disposition: Home   DISCHARGE MEDICATION: Allergies as of 11/08/2021   No Known Allergies      Medication List     TAKE these medications    Accu-Chek Guide test strip Generic drug: glucose blood Dx E11.49 - Use to check blood glucose three times daily or as directed.   amoxicillin-clavulanate 875-125 MG tablet Commonly known as: AUGMENTIN Take 1 tablet by mouth 2 (two) times daily.   ascorbic acid 500 MG tablet Commonly known as: VITAMIN C Take 500 mg by mouth daily.   aspirin EC 81 MG tablet Take 81 mg by mouth daily.   atorvastatin 80 MG tablet Commonly known as: LIPITOR Take 1 tablet (80 mg total) by mouth  daily.   BD Veo Insulin Syr U/F 1/2Unit 31G X 15/64" 0.3 ML Misc Generic drug: Insulin Syringe-Needle U-100 USE TO INJECT INSULIN FOUR TIMES DAILY AS DIRECTED   Insulin Syringe-Needle U-100 31G X 5/16" 0.5 ML Misc Commonly known as: BD Insulin Syringe U/F Use to inject insulin 4 times daily. DX:E11.9   Insulin Syringe-Needle U-100 31G X 5/16" 1 ML Misc Commonly known as: BD Insulin Syringe U/F Use to inject tresiba/long acting insulin once daily   fenofibrate 48 MG  tablet Commonly known as: TRICOR TAKE 1 TABLET BY MOUTH EVERY DAY   Fish Oil 1360 MG Caps Take 1,360 mg by mouth daily.   hydrochlorothiazide 12.5 MG capsule Commonly known as: MICROZIDE TAKE 1 CAPSULE BY MOUTH EVERY DAY IN THE MORNING What changed:  how much to take how to take this when to take this   insulin aspart 100 UNIT/ML FlexPen Commonly known as: NOVOLOG Inject 15-25 Units into the skin 3 (three) times daily with meals. Please provide pen needles if possible What changed: additional instructions   Januvia 50 MG tablet Generic drug: sitaGLIPtin TAKE 1 TABLET BY MOUTH EVERY DAY IN THE MORNING What changed: See the new instructions.   Lancets Misc USED TO CHECK BLOOD GLUCOSE 3-4 TIMES DAILY   onetouch ultrasoft lancets Use to test blood sugars daily. Dx: E11.9   multivitamin with minerals Tabs tablet Take 1 tablet by mouth 2 (two) times daily.   pantoprazole 40 MG tablet Commonly known as: PROTONIX Take 1 tablet (40 mg total) by mouth daily. Start taking on: November 09, 2021   pyridoxine 100 MG tablet Commonly known as: B-6 Take 100 mg by mouth 2 (two) times daily.   ramipril 5 MG capsule Commonly known as: ALTACE TAKE 1 CAPSULE BY MOUTH EVERY DAY IN THE MORNING What changed: See the new instructions.   Tyler Aas FlexTouch 200 UNIT/ML FlexTouch Pen Generic drug: insulin degludec Inject 80-90 Units into the skin daily. What changed: additional instructions   Vitamin B-12 5000 MCG Subl Place 5,000 mcg under the tongue daily.               Discharge Care Instructions  (From admission, onward)           Start     Ordered   11/08/21 0000  Discharge wound care:       Comments: No change from prior wound care plan   11/08/21 1100            Follow-up Information     Marin Olp, MD. Go in 1 week(s).   Specialty: Family Medicine Contact information: [place] [address] [phone]          Gerrit Heck V, DO Follow up.   Specialty: Gastroenterology Contact information: [place] [address] [phone]         Mignon Pine, DO Follow up.   Specialties: Infectious Diseases, Internal Medicine Contact information: [address] [place] [address] [phone]                 Discharge Instructions     Discharge instructions   Complete by: As directed    IMPORTANT DISCHARGE INSTRUCTIONS From Dr. Loleta Books: You were admitted with anemia You were given two units of blood transfusion You had an upper endoscopy (EGD) that showed normal esophagus, stomach and duodenum You had a capsule endoscopy and the results are pending  For now, in case there was  some blood loss from the GI tract, take pantoprazole/Protonix 40 mg daily to suppress stomach acid and reduce the chance of bleeding  Follow up with Dr. Vivia Ewing office (call them at the number below to determine if you need a follow up appointment) Ask them if you should continue pantoprazole for the cause of bleeding, or if you can stop it   (Likely you do not need this medicine long term)   Follow up with Dr. Yong Channel next week Because the anemia may be from Linezolid, ask him to recheck your hemoglobin  Go see Dr. Juleen China   Discharge wound care:   Complete by: As directed    No change from prior wound care plan   Increase activity slowly   Complete by: As directed        Discharge Exam: Filed Weights   11/06/21 1046 11/06/21 1739  Weight: (!) 148.8 kg (!) 144.4 kg    General: Pt is alert, awake, not in acute distress Cardiovascular: RRR, nl S1-S2, no murmurs appreciated.   No LE edema.   Respiratory: Normal respiratory rate and rhythm.  CTAB without rales or wheezes. Abdominal: Abdomen soft and non-tender.  No distension or HSM.   MSK: Foot unchanged from yesteday, appears healing well Neuro/Psych: Strength symmetric in upper and lower extremities.   Judgment and insight appear normal.   Condition at discharge: good  The results of significant diagnostics from this hospitalization (including imaging, microbiology, ancillary and laboratory) are listed below for reference.   Imaging Studies: No results found.     Labs: CBC: Recent Labs  Lab 11/05/21 1116 11/06/21 1217 11/07/21 0544 11/08/21 0545  WBC 6.3 6.9 7.9 8.1  NEUTROABS 3,389  --   --   --   HGB 6.7* 6.4* 7.6* 8.8*  HCT 20.7* 20.0* 22.8* 26.3*  MCV 79.6* 80.3 80.6 80.7  PLT 248 230 216 793   Basic Metabolic Panel: Recent Labs  Lab 11/05/21 1116 11/06/21 1217 11/07/21 0544  NA 142 142 138  K 4.2 3.8 3.7  CL 108 110 109  CO2 '28 27 24  '$ GLUCOSE 170* 143* 112*  BUN 27* 23* 17  CREATININE 1.70* 1.42* 1.22  CALCIUM 9.6 9.5 8.9   Liver Function Tests: Recent Labs  Lab 11/05/21 1116 11/06/21 1217 11/07/21 0544 11/07/21 1634  AST '14 19 15  '$ --   ALT '18 24 20  '$ --   ALKPHOS  --  51 43  --   BILITOT 0.8 0.9 1.9* 1.6*  PROT 7.0 7.5 6.4*  --   ALBUMIN  --  3.8 3.4*  --    CBG: Recent Labs  Lab 11/07/21 0759 11/07/21 1204 11/07/21 1729 11/07/21 2038 11/08/21 0755  GLUCAP 109* 102* 91 85 95    Discharge time spent: approximately 35 minutes spent on discharge counseling, evaluation of patient on day of discharge, and coordination of discharge planning with nursing, social work, pharmacy and case management  Signed: Edwin Dada, MD [facility] 11/08/2021

## 2021-11-08 NOTE — Progress Notes (Addendum)
Attending physician's note   I have reviewed the chart and discussed his care on rounds. I performed a substantive portion of this encounter, including complete performance of at least one of the key components, in conjunction with the APP. I agree with the APP's note, impression, and recommendations with my edits.   Will plan to read video capsule endoscopy later today when images are downloaded.  Ok to discharge home.  Will call patient with results.  [address] DO, FACG [phone] office          Progress Note   Subjective  Chief Complaint:Symptomatic Anemia  EGD 11/07/21 normal, VCE dropped.  Today, the patient tells me he is feeling fine he had a bowel move this morning and was willing to go home.   Objective   Vital signs in last 24 hours: Temp:  [98.5 F (36.9 C)-98.9 F (37.2 C)] 98.9 F (37.2 C) (10/20 0542) Pulse Rate:  [73-88] 79 (10/20 0542) Resp:  [15-24] 17 (10/20 0542) BP: (140-166)/(75-94) 166/89 (10/20 0542) SpO2:  [96 %-100 %] 100 % (10/20 0542) Last BM Date : 11/06/21 General:    AA male in NAD Heart:  Regular rate and rhythm; no murmurs Lungs: Respirations even and unlabored, lungs CTA bilaterally Abdomen:  Soft, nontender and nondistended. Normal bowel sounds. Psych:  Cooperative. Normal mood and affect.  Intake/Output from previous day: 10/19 0701 - 10/20 0700 In: 200 [I.V.:200] Out: -  Intake/Output this shift: Total I/O In: 240 [P.O.:240] Out: -   Lab Results: Recent Labs    11/06/21 1217 11/07/21 0544 11/08/21 0545  WBC 6.9 7.9 8.1  HGB 6.4* 7.6* 8.8*  HCT 20.0* 22.8* 26.3*  PLT 230 216 224   BMET Recent Labs    11/06/21 1217 11/07/21 0544  NA 142 138  K 3.8 3.7  CL 110 109  CO2 27 24  GLUCOSE 143* 112*  BUN 23* 17  CREATININE 1.42* 1.22  CALCIUM 9.5 8.9   LFT Recent Labs    11/07/21 0544 11/07/21 1634  PROT 6.4*  --   ALBUMIN 3.4*  --   AST 15  --   ALT 20  --   ALKPHOS 43  --   BILITOT 1.9*  1.6*  BILIDIR  --  0.3*  IBILI  --  1.3*   PT/INR Recent Labs    11/06/21 1341  LABPROT 13.9  INR 1.1   11/07/2021 EGD Impression:               - Normal esophagus.                           - Normal stomach.                           - Normal examined duodenum.                           - I elected against mucosal biopsies in favor of                            further small bowel interrogation with Video                            Capsule Endoscopy and not wanting to complicate  those images with possible post biopsy bleed or                            biopsy ulcer.                           - No specimens collected.  Recommendation:           - Return patient to hospital ward for ongoing care.                           - Continue NPO for now until VCE placement.                           - Continue present medications.                           - To visualize the small bowel, perform video                            capsule endoscopy today. After VCE placement, NPO                            x2 hours, then restart clears and slowly advance                            per VCE protocol.                           - Continue serial CBC checks with additional blood                            products as needed per protocol.   Assessment / Plan:    Assessment: 1.  Symptomatic acute on chronic anemia: 6 g drop in hemoglobin over the past month, 12 months ago now 6.4--> stable currently 8.8, recent colonoscopy 3/23 with no etiology, recent EGD normal, VCE pending 2.  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  Okay with patient discharge home today.  He tells me he has follow-up with his PCP next week who can recheck his labs.  Would recommend a recheck of CBC and iron studies at the time and consideration for oral iron versus IV  iron 2.  Patient will be alerted when we receive results from [place] will sign off.  Please let us know if we can be of any further assistance.   LOS: 0 days   [name]  11/08/2021, 11:34 AM

## 2021-11-11 ENCOUNTER — Encounter (HOSPITAL_BASED_OUTPATIENT_CLINIC_OR_DEPARTMENT_OTHER): Payer: BLUE CROSS/BLUE SHIELD | Admitting: Internal Medicine

## 2021-11-11 ENCOUNTER — Ambulatory Visit: Payer: BLUE CROSS/BLUE SHIELD | Admitting: Family Medicine

## 2021-11-11 ENCOUNTER — Encounter: Payer: Self-pay | Admitting: Family Medicine

## 2021-11-11 VITALS — BP 160/86 | HR 88 | Temp 98.8°F | Ht 72.0 in | Wt 313.6 lb

## 2021-11-11 DIAGNOSIS — D649 Anemia, unspecified: Secondary | ICD-10-CM | POA: Diagnosis not present

## 2021-11-11 DIAGNOSIS — E1169 Type 2 diabetes mellitus with other specified complication: Secondary | ICD-10-CM | POA: Diagnosis not present

## 2021-11-11 DIAGNOSIS — I10 Essential (primary) hypertension: Secondary | ICD-10-CM | POA: Diagnosis not present

## 2021-11-11 DIAGNOSIS — E785 Hyperlipidemia, unspecified: Secondary | ICD-10-CM | POA: Diagnosis not present

## 2021-11-11 DIAGNOSIS — E1149 Type 2 diabetes mellitus with other diabetic neurological complication: Secondary | ICD-10-CM

## 2021-11-11 DIAGNOSIS — M86671 Other chronic osteomyelitis, right ankle and foot: Secondary | ICD-10-CM | POA: Diagnosis not present

## 2021-11-11 DIAGNOSIS — L97514 Non-pressure chronic ulcer of other part of right foot with necrosis of bone: Secondary | ICD-10-CM | POA: Diagnosis not present

## 2021-11-11 LAB — MICROALBUMIN / CREATININE URINE RATIO
Creatinine,U: 85.9 mg/dL
Microalb Creat Ratio: 4.9 mg/g (ref 0.0–30.0)
Microalb, Ur: 4.2 mg/dL — ABNORMAL HIGH (ref 0.0–1.9)

## 2021-11-11 LAB — CBC WITH DIFFERENTIAL/PLATELET
Basophils Absolute: 0 10*3/uL (ref 0.0–0.1)
Basophils Relative: 0.3 % (ref 0.0–3.0)
Eosinophils Absolute: 0.2 10*3/uL (ref 0.0–0.7)
Eosinophils Relative: 2.6 % (ref 0.0–5.0)
HCT: 28.2 % — ABNORMAL LOW (ref 39.0–52.0)
Hemoglobin: 9.2 g/dL — ABNORMAL LOW (ref 13.0–17.0)
Lymphocytes Relative: 24.8 % (ref 12.0–46.0)
Lymphs Abs: 1.6 10*3/uL (ref 0.7–4.0)
MCHC: 32.8 g/dL (ref 30.0–36.0)
MCV: 80.6 fl (ref 78.0–100.0)
Monocytes Absolute: 0.7 10*3/uL (ref 0.1–1.0)
Monocytes Relative: 10.6 % (ref 3.0–12.0)
Neutro Abs: 3.9 10*3/uL (ref 1.4–7.7)
Neutrophils Relative %: 61.7 % (ref 43.0–77.0)
Platelets: 271 10*3/uL (ref 150.0–400.0)
RBC: 3.5 Mil/uL — ABNORMAL LOW (ref 4.22–5.81)
RDW: 16 % — ABNORMAL HIGH (ref 11.5–15.5)
WBC: 6.4 10*3/uL (ref 4.0–10.5)

## 2021-11-11 LAB — GLUCOSE, CAPILLARY
Glucose-Capillary: 154 mg/dL — ABNORMAL HIGH (ref 70–99)
Glucose-Capillary: 143 mg/dL — ABNORMAL HIGH (ref 70–99)

## 2021-11-11 LAB — COMPREHENSIVE METABOLIC PANEL
ALT: 23 U/L (ref 0–53)
AST: 25 U/L (ref 0–37)
Albumin: 4.2 g/dL (ref 3.5–5.2)
Alkaline Phosphatase: 67 U/L (ref 39–117)
BUN: 16 mg/dL (ref 6–23)
CO2: 26 mEq/L (ref 19–32)
Calcium: 9.5 mg/dL (ref 8.4–10.5)
Chloride: 104 mEq/L (ref 96–112)
Creatinine, Ser: 1.18 mg/dL (ref 0.40–1.50)
GFR: 71.41 mL/min (ref >60.00)
Glucose, Bld: 151 mg/dL — ABNORMAL HIGH (ref 70–99)
Potassium: 4.1 mEq/L (ref 3.5–5.1)
Sodium: 138 mEq/L (ref 135–145)
Total Bilirubin: 0.7 mg/dL (ref 0.2–1.2)
Total Protein: 7.4 g/dL (ref 6.0–8.3)

## 2021-11-11 LAB — LDL CHOLESTEROL, DIRECT: Direct LDL: 37 mg/dL

## 2021-11-11 LAB — HEMOGLOBIN A1C: Hgb A1c MFr Bld: 7.5 % — ABNORMAL HIGH (ref 4.6–6.5)

## 2021-11-11 NOTE — Patient Instructions (Addendum)
Please stop by lab before you go If you have mychart- we will send your results within 3 business days of [place] receiving them.  If you do not have mychart- we will call you about results within 5 business days of [place] receiving them.  *please also note that you will see labs on mychart as soon as they post. I will later go in and write notes on them- will say "notes from Dr. Yong Channel"   Your blood pressure trend concerns me- higher since hospital but had bene looking really good prior to then. Instead of changing medicine now-  I would like for you to buy/use a home cuff  (omron 3 or silver series is fine- should be less than $40) to check at least 4x a week. Your goal is <135/85. Update me in 2-3 weeks by mychart with your readings- lets keep next visit until that time- if pressure comes down we can cancel and move out 4 months as long as a1c ok  Recommended follow up: Return for next already scheduled visit or sooner if needed.

## 2021-11-11 NOTE — Progress Notes (Signed)
[name], [name] ([id number]) [id number] Page 1 of 2 Visit Report for 11/11/2021 Problem List Details Patient Name: Date of Service: [name], [name] 11/11/2021 8:00 [name] Medical Record Number: [id number] Patient Account Number: [id number] Date of Birth/Sex: Treating RN: Mar 28, 1970 (51 y.o. 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 11/11/2021 Medical Record Number: [id number] Patient Account Number: [id number] Date of Birth/Sex: Treating RN: 1971/01/03 (51 y.o. [name] Primary Care Provider: [name] Other Clinician: [name] Referring Provider: Treating Provider/Extender: [name] in Treatment: 18 Diagnosis Coding ICD-10 Codes Code Description E11.49 Type 2 diabetes mellitus with other diabetic neurological complication Y63.785 Other chronic osteomyelitis, right ankle and foot L97.514 Non-pressure chronic ulcer of other part of right foot with necrosis of bone [name], Phyllip [name] ([id number]) [id number] Page 2 of  2 Facility Procedures : CPT4 Code Description: [id number] G0277-(Facility Use Only) HBOT full body chamber, 23mn , ICD-10 Diagnosis Description E11.49 Type 2 diabetes mellitus with other diabetic neurolog L97.514 Non-pressure chronic ulcer of other part of right foo M86.671  Other chronic osteomyelitis, right ankle and foot Modifier: ical complication t with necrosis o Quantity: 4 f bone Physician Procedures : CPT4 Code Description Modifier [id number]- [id number] PHYS HYPERBARIC OXYGEN THERAPY ICD-10 Diagnosis Description E11.49 Type 2 diabetes mellitus with other diabetic neurological complication LO29.476Non-pressure chronic ulcer of other part of right foot  with necrosis o M86.671 Other chronic osteomyelitis, right ankle and foot Quantity: 1 f bone Electronic Signature(s) Signed: 11/11/2021 2:10:01 PM By: GValeria BatmanEMT Signed: 11/11/2021 3:32:17 PM By: HKalman ShanDO Entered By: GValeria Batmanon 11/11/2021 14:10:00

## 2021-11-11 NOTE — Progress Notes (Addendum)
[name], [name] ([id number]) [id number] Page 1 of 2 Visit Report for 11/11/2021 HBO Details Patient Name: Date of Service: [name], [name] 11/11/2021 8:00 [name] Medical Record Number: [id number] Patient Account Number: [id number] Date of Birth/Sex: Treating RN: [birth year] [name] Primary Care Hager [place]ston: [name] Other Clinician: [name] Referring Quamir Willemsen: Treating Tylicia Sherman/Extender: [name] in Treatment: 18 HBO Treatment Course Details Treatment Course Number: 1 Ordering Lazariah Savard: [name] T Treatments Ordered: otal 80 HBO Treatment Start Date: 08/14/2021 HBO Indication: Diabetic Ulcer(s) of the Lower Extremity Standard/Conservative Wound Care tried and failed greater than or equal to 30 days HBO Treatment Details Treatment Number: 61 Patient Type: Outpatient Chamber Type: Monoplace Chamber Serial #: [id number] Treatment Protocol: 2.0 ATA with 90 minutes oxygen, and no air breaks Treatment Details Compression Rate Down: 2.0 psi / minute De-Compression Rate Up: 2.0 psi / minute Air breaks and breathing Decompress Decompress Compress Tx Pressure Begins Reached periods Begins Ends (leave unused spaces blank) Chamber Pressure (ATA 1 2 ------2 1 ) Clock Time (24 hr) 08:15 08:26 - - - - - - 09:56 10:04 Treatment Length: 109 (minutes) Treatment Segments: 4 Vital Signs Capillary Blood Glucose Reference Range: 80 - 120 mg / dl HBO Diabetic Blood Glucose Intervention Range: <131 mg/dl or >249 mg/dl Time Vitals Blood Respiratory Capillary Blood Glucose Pulse Action Type: Pulse: Temperature: Taken: Pressure: Rate: Glucose (mg/dl): Meter #: Oximetry (%) Taken: Pre 08:02 139/86 88 16 98.8 154 Post 10:13 156/91 75 16 98.2 143 Treatment Response Treatment Toleration: Well Treatment Completion Status: Treatment Completed without Adverse Event Additional Procedure  Documentation Tissue Sevierity: Necrosis of bone Physician HBO Attestation: I certify that I supervised this HBO treatment in accordance with Medicare guidelines. A trained emergency response team is readily available per Yes hospital policies and procedures. Continue HBOT as ordered. Yes Electronic Signature(s) Signed: 11/14/2021 10:15:01 AM By: [name] DO Previous Signature: 11/11/2021 2:09:41 PM Version By: [name] EMT Previous Signature: 11/11/2021 3:32:17 PM Version By: [name] DO Entered By: [name] on 11/14/2021 10:12:52 [name], [name] ([id number]) [id number] Page 2 of 2 -------------------------------------------------------------------------------- HBO Safety Checklist Details Patient Name: Date of Service: [name], [name] 11/11/2021 8:00 [name] Medical Record Number: [id number] Patient Account Number: [id number] Date of Birth/Sex: Treating RN: 1970-11-13 [name] Primary Care Karrigan Messamore: [name] Other Clinician: [name] Referring Misheel Gowans: Treating Anaiya Wisinski/Extender: [name] in Treatment: 18 HBO Safety Checklist Items Safety Checklist Consent Form Signed Patient voided / foley secured and emptied When did you last eato 0655 Last dose of injectable or oral agent 0650 Ostomy pouch emptied and vented if applicable NA All implantable devices assessed, documented and approved NA Intravenous access site secured and place NA Valuables secured Linens and cotton and cotton/polyester blend (less than 51% polyester) Personal oil-based products / skin lotions / body lotions removed Wigs or hairpieces removed NA Smoking or tobacco materials removed Books / newspapers / magazines / loose paper removed Cologne, aftershave, perfume and deodorant removed Jewelry removed (may wrap wedding band) Make-up removed NA Hair care products removed Battery operated  devices (external) removed Heating patches and chemical warmers removed Titanium eyewear removed NA Nail polish cured greater than 10 hours NA Casting material cured greater than 10 hours NA Hearing aids removed NA Loose dentures or partials removed NA Prosthetics have been removed NA Patient demonstrates correct use of air break device (if applicable) Patient concerns have been addressed  Patient grounding bracelet on and cord attached to chamber Specifics for Inpatients (complete in addition to above) Medication sheet sent with patient NA Intravenous medications needed or due during therapy sent with patient NA Drainage tubes (e.g. nasogastric tube or chest tube secured and vented) NA Endotracheal or Tracheotomy tube secured NA Cuff deflated of air and inflated with saline NA Airway suctioned NA Notes The safety checklist was done before the treatment was started. Electronic Signature(s) Signed: 11/11/2021 2:08:25 PM By: [name] EMT Entered By: [name] on 11/11/2021 14:08:25

## 2021-11-11 NOTE — Progress Notes (Signed)
Phone [phone] In person visit   Subjective:   [name] is a 51 y.o. year old very pleasant male patient who presents for/with See problem oriented charting Chief Complaint  Patient presents with   Hospitalization Follow-up   Diabetes   Past Medical History-  Patient Active Problem List   Diagnosis Date Noted   Morbid obesity ([place]) 03/08/2015    Priority: High   Type II diabetes mellitus with neurological manifestations ([place]) 03/07/2015    Priority: High   Diabetic retinopathy ([place]) 01/25/2018    Priority: Medium    CKD (chronic kidney disease), stage II 11/17/2016    Priority: Medium    Diabetic neuropathy ([place]) 11/17/2016    Priority: Medium    Elevated LFTs     Priority: Medium    Hyperlipidemia associated with type 2 diabetes mellitus ([place]) 03/08/2015    Priority: Medium    Essential hypertension 03/08/2015    Priority: Medium    Charcot's joint of foot, right 11/18/2019    Priority: Low   Status post peripherally inserted central catheter (PICC) central line placement 12/08/2016    Priority: Low   B12 deficiency 11/17/2016    Priority: Low   Iron deficiency anemia 11/17/2016    Priority: Low   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 ([place]) 11/17/2016    Priority: Low   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 ([place]) 11/17/2016    Priority: Low   Melena and acute on chronic blood loss anemia 11/07/2021   Pituitary adenoma ([place]) 11/07/2021   PVD (peripheral vascular disease) ([place]) 11/07/2021   Symptomatic anemia 11/06/2021   Ulcer of right foot with necrosis of bone ([place])    Osteomyelitis ([place]) 08/31/2020   Diabetic foot ulcer ([place]) 11/18/2019   Pyogenic inflammation of bone ([place]) 11/09/2019   Osteomyelitis of ankle or foot, acute, right ([place]) 11/09/2019   Diabetic foot infection ([place]) 07/29/2016    Medications- reviewed and updated Current Outpatient Medications  Medication Sig Dispense Refill    ascorbic acid (VITAMIN C) 500 MG tablet Take 500 mg by mouth daily.     aspirin EC 81 MG tablet Take 81 mg by mouth daily.     atorvastatin (LIPITOR) 80 MG tablet Take 1 tablet (80 mg total) by mouth daily. 90 tablet 3   Cyanocobalamin (VITAMIN B-12) 5000 MCG SUBL Place 5,000 mcg under the tongue daily.     fenofibrate (TRICOR) 48 MG tablet TAKE 1 TABLET BY MOUTH EVERY DAY (Patient taking differently: Take 48 mg by mouth daily.) 90 tablet 4   glucose blood (ACCU-CHEK GUIDE) test strip Dx E11.49 - Use to check blood glucose three times daily or as directed. 300 each 1   hydrochlorothiazide (MICROZIDE) 12.5 MG capsule TAKE 1 CAPSULE BY MOUTH EVERY DAY IN THE MORNING (Patient taking differently: Take 12.5 mg by mouth daily. TAKE 1 CAPSULE BY MOUTH EVERY DAY IN THE MORNING) 100 capsule 3   insulin aspart (NOVOLOG) 100 UNIT/ML FlexPen Inject 15-25 Units into the skin 3 (three) times daily with meals. Please provide pen needles if possible (Patient taking differently: Inject 15-25 Units into the skin 3 (three) times daily with meals. Per sliding scale) 45 mL 3   insulin degludec (TRESIBA FLEXTOUCH) 200 UNIT/ML FlexTouch Pen Inject 80-90 Units into the skin daily. (Patient taking differently: Inject 80-90 Units into the skin daily. Per sliding scale) 15 mL 3   Insulin Syringe-Needle U-100 (BD INSULIN SYRINGE U/F) 31G X  5/16" 0.5 ML MISC Use to inject insulin 4 times daily. DX:E11.9 100 each 4   Insulin Syringe-Needle U-100 (BD INSULIN SYRINGE U/F) 31G X 5/16" 1 ML MISC Use to inject tresiba/long acting insulin once daily 100 each 3   Insulin Syringe-Needle U-100 (BD VEO INSULIN SYR U/F 1/2UNIT) 31G X 15/64" 0.3 ML MISC USE TO INJECT INSULIN FOUR TIMES DAILY AS DIRECTED 300 each 2   JANUVIA 50 MG tablet TAKE 1 TABLET BY MOUTH EVERY DAY IN THE MORNING (Patient taking differently: Take 50 mg by mouth daily.) 90 tablet 1   Lancets (ONETOUCH ULTRASOFT) lancets Use to test blood sugars daily. Dx: E11.9 100 each 12    Lancets MISC USED TO CHECK BLOOD GLUCOSE 3-4 TIMES DAILY 200 each 3   Multiple Vitamin (MULTIVITAMIN WITH MINERALS) TABS tablet Take 1 tablet by mouth 2 (two) times daily.     Omega-3 Fatty Acids (FISH OIL) 1360 MG CAPS Take 1,360 mg by mouth daily.      pyridoxine (B-6) 100 MG tablet Take 100 mg by mouth 2 (two) times daily.     ramipril (ALTACE) 5 MG capsule TAKE 1 CAPSULE BY MOUTH EVERY DAY IN THE MORNING (Patient taking differently: Take 5 mg by mouth daily.) 90 capsule 4   No current facility-administered medications for this visit.     Objective:  BP (!) 160/86   Pulse 88   Temp 98.8 F (37.1 C) (Temporal)   Ht 6' (1.829 m)   Wt (!) 313 lb 9.6 oz (142.2 kg)   SpO2 98%   BMI 42.53 kg/m  Gen: NAD, resting comfortably CV: RRR no murmurs rubs or gallops Lungs: CTAB no crackles, wheeze, rhonchi Abdomen: soft/nontender/nondistended/normal bowel sounds.  Ext: trace edema bilaterally Skin: warm, dry     Assessment and Plan   #Hospital follow-up for anemia-concern for linezolid associated anemia S: Patient with peripheral vascular disease status post prior notation of right third through fifth toes and later diagnosed with right metatarsal and midfoot osteomyelitis starting summer 2023-has been on IV antibiotics through PICC line through Dr. Juleen China including linezolid and levofloxacin who had recently been for hospitalization and transition to Augmentin who presented with increased fatigue and had been noted at hyperbaric center day before admission to have hemoglobin of 6.7 Which was down from 9.4 three weeks prior and 12.2 a month ago.  Hemoglobin improved to 8.8 before discharge  Symptomatic anemia was thought related to linezolid use.  He had reported some darker stools but fecal occult blood test was negative.  Reticulocytes were reduced pointing away from acute blood loss.  Iron and B12 levels were sufficient.  He was transfused 2 units and hemoglobin improved.  No further  bleeding was noted during hospitalization.  Did have some darker stools and some concern initially for melena.  From recent phone note from Dr. Bryan Lemma posthospitalization "Based on the normal upper endoscopy, normal video capsule endoscopy, and FOBT negative stool, I have a higher suspicion for antibiotic induced isolated anemia (specifically recent linezolid).""  He was recommended to have repeat CBC in approximately a week and will have further follow-up with ID.  Pharmacy and hematology anticipated this to last between 1 and 4 weeks and then resolve spontaneously.  Per prescription last linezolid would have been on 11/04/2021- he thinks he took last one on 13th or 14th.  It was also thought that anemia from chronic osteomyelitis could contribute as well/essentially chronic disease  In regards to osteomyelitis he was continued on Augmentin A/P:  Anemia likely linezolid associated-full GI work-up reassuring as above. - Last date of linezolid was on the 13th or 14 October we believe-hoping within 1 to 4 weeks for full resolution back to baseline in regards to anemia based on hematology and pharmacy consults in the hospital - Update CBC today and depending on trend may have him back in a few weeks for repeat   #Diabetes-typically with A1c at least under 7.5 S: Medication: Tresiba at night up to 85 units typically- had to decrease to 60 to be able to go into hyperbaric treatments,NovoLog 15 to 20 units twice daily- he is abotu at 35 per day -Take Januvia 50 mg in the morning.  CBGs- mornings usually 130-140 to allow for chamber Lab Results  Component Value Date   HGBA1C 6.8 (H) 05/15/2021   HGBA1C 6.5 (H) 08/31/2020   HGBA1C 6.9 (H) 06/25/2020  A/P: well controlled int he past -not sure how accurate a1c will be with recent transfusion plus he has had to push sugar higher to allow himself to enter hyperbaric chamber (has to be at least 131 -for now continue current meds  #  Hyperlipidemia-Ideal LDL under 70-tolerate under 100 S:Medication: Atorvastatin 60 mg daily (1 and 1/2 tablet of 40 mg) along with fenofibrate 48 mg - also prefers to take aspirin for primary prevention  Lab Results  Component Value Date   CHOL 169 05/15/2021   HDL 41.80 05/15/2021   LDLCALC 111 (H) 05/15/2021   LDLDIRECT 98.0 10/31/2020   TRIG 79.0 05/15/2021   CHOLHDL 4 05/15/2021   A/P: hopefully improved- update direct LDL   #Hypertension/CKD stage III  S: HCTZ 12.5 mg and Ramipril 5 mg  Home readings #s: does not have cuff   BP Readings from Last 3 Encounters:  11/11/21 (!) 160/86  11/08/21 (!) 166/89  11/05/21 128/83   A/P: poor control for first time in a while- Your blood pressure trend concerns me- higher since hospital but had bene looking really good prior to then. Instead of changing medicine now-  I would like for you to buy/use a home cuff  (omron 3 or silver series is fine- should be less than $40) to check at least 4x a week. Your goal is <135/85. Update me in 2-3 weeks by mychart with your readings- lets keep next visit until that time- if pressure comes down we can cancel and move out 4 months as long as a1c ok  #B12 deficiency/anemia S Current treatment/medication(oral vs IM): Cyanocobalamin 5000 mcg daily. Also was takes ferrous sulfate supplements twice daily.  Lab Results  Component Value Date   [id number] 607 11/06/2021    Lab Results  Component Value Date   FERRITIN 340 (H) 11/06/2021  A/P: b12 deficiency appropriately treated with orals.  He prefers to take iron still  Recommended follow up: Return for next already scheduled visit or sooner if needed. Future Appointments  Date Time Provider [place]  11/12/2021  7:45 AM [name], MD [facility]  11/12/2021  8:30 AM Valinda Party, DO [facility]  11/13/2021  8:00 AM [name], MD [facility]  11/14/2021  8:00 AM [name], MD [facility]   11/15/2021  8:00 AM Valinda Party, DO [facility]  11/19/2021  7:45 AM [name], MD [facility]  12/03/2021  1:20 PM Marin Olp, MD [facility]  12/16/2021 10:45 AM Mignon Pine, DO [facility]    Lab/Order associations:   ICD-10-CM   1. Anemia, unspecified type  D64.9     2. Type II diabetes mellitus with neurological manifestations ([place])  E11.49     3. Hyperlipidemia associated with type 2 diabetes mellitus ([place])  E11.69    E78.5     4. Essential hypertension  I10       No orders of the defined types were placed in this encounter.   Return precautions advised.  [name], MD

## 2021-11-11 NOTE — Progress Notes (Addendum)
[name] ([id number]) [id number] Page 1 of 2 Visit Report for 11/11/2021 Arrival Information Details Patient Name: Date of Service: [name] 11/11/2021 8:00 [name] Medical Record Number: [id number] Patient Account Number: [id number] Date of Birth/Sex: Treating RN: April 20, 1970 (51 y.o. [name] Primary Care Lesli Issa: [name] Other Clinician: [name] Referring Divit Stipp: Treating Kamron Vanwyhe/Extender: [name]8 Visit Information History Since Last Visit All ordered tests and consults were completed: Yes Patient Arrived: Ambulatory Added or deleted any medications: Yes Arrival Time: 09:17 Any [name] allergies or adverse reactions: No Accompanied By: None Had a fall or experienced change [name] No Transfer Assistance: None activities of daily living that may affect Patient Requires Transmission-Based Precautions: No risk of falls: Patient Has Alerts: Yes Signs or symptoms of abuse/neglect since last visito No Hospitalized since last visit: Yes Implantable device outside of the clinic excluding No cellular tissue based products placed [name] the center since last visit: Pain Present Now: No Electronic Signature(s) Signed: 11/11/2021 9:40:19 AM By: [name] EMT Entered By: [name] on 11/11/2021 09:40:19 -------------------------------------------------------------------------------- Encounter Discharge Information Details Patient Name: Date of Service: [name] 11/11/2021 8:00 [name] Medical Record Number: [id number] Patient Account Number: [id number] Date of Birth/Sex: Treating RN: [birth year] (51 y.o. [name] Primary Care Neda Willenbring: [name] Other Clinician: [name] Referring Larissa Pegg: Treating Rilley Poulter/Extender: [name]8 Encounter Discharge Information Items Discharge Condition:  Stable Ambulatory Status: Ambulatory Discharge Destination: Home Transportation: Other Accompanied By: None Schedule Follow-up Appointment: Yes Clinical Summary of Care: Electronic Signature(s) Signed: 11/11/2021 2:10:40 PM By: [name] EMT Entered By: [name] on 11/11/2021 14:10:39 [name] ([id number]) [id number] Page 2 of 2 -------------------------------------------------------------------------------- Vitals Details Patient Name: Date of Service: [name] 11/11/2021 8:00 [name] Medical Record Number: [id number] Patient Account Number: [id number] Date of Birth/Sex: Treating RN: Apr 21, 1970 (51 y.o. [name] Primary Care Collin Rengel: [name] Other Clinician: [name] Referring Tajia Szeliga: Treating Tola Meas/Extender: [name]8 Vital Signs Time Taken: 08:02 Temperature (F): 98.8 Height ([name]: 72 Pulse (bpm): 88 Weight (lbs): 312 Respiratory Rate (breaths/min): 16 Body Mass Index (BMI): 42.3 Blood Pressure (mmHg): 139/86 Capillary Blood Glucose (mg/dl): 154 Reference Range: 80 - 120 mg / dl Electronic Signature(s) Signed: 11/11/2021 2:07:05 PM By: [name] EMT Entered By: [name] on 11/11/2021 14:07:05

## 2021-11-11 NOTE — Telephone Encounter (Signed)
Left message for pt to call back  °

## 2021-11-12 ENCOUNTER — Encounter (HOSPITAL_BASED_OUTPATIENT_CLINIC_OR_DEPARTMENT_OTHER): Payer: BLUE CROSS/BLUE SHIELD | Admitting: General Surgery

## 2021-11-12 DIAGNOSIS — E1169 Type 2 diabetes mellitus with other specified complication: Secondary | ICD-10-CM | POA: Diagnosis not present

## 2021-11-12 LAB — GLUCOSE, CAPILLARY
Glucose-Capillary: 185 mg/dL — ABNORMAL HIGH (ref 70–99)
Glucose-Capillary: 151 mg/dL — ABNORMAL HIGH (ref 70–99)

## 2021-11-12 NOTE — Progress Notes (Signed)
[name], [name] ([id number]) [id number] Page 1 of 10 Visit Report for 11/12/2021 Chief Complaint Document Details Patient Name: Date of Service: [name], [place] [name] 11/12/2021 7:45 [name] Medical Record Number: [id number] Patient Account Number: [id number] Date of Birth/Sex: Treating RN: 1970-09-26 (51 y.o. [name] Primary Care Provider: [name] Other Clinician: Referring Provider: Treating Provider/Extender: [name] in Treatment: 18 Information Obtained from: Patient Chief Complaint Patients presents for treatment of an open diabetic ulcer Electronic Signature(s) Signed: 11/12/2021 8:17:52 AM By: [name] Maudlin MD FACS Entered By: [name] on 11/12/2021 08:17:52 -------------------------------------------------------------------------------- Debridement Details Patient Name: Date of Service: [name], [name] 11/12/2021 7:45 [name] Medical Record Number: [id number] Patient Account Number: [id number] Date of Birth/Sex: Treating RN: 07-16-1970 [name] Primary Care Provider: [name] Other Clinician: Referring Provider: Treating Provider/Extender: [name] in Treatment: 18 Debridement Performed for Assessment: Wound #2 [place] Performed By: Physician [name] Maudlin, MD Debridement Type: Debridement Severity of Tissue Pre Debridement: Fat layer exposed Level of Consciousness (Pre-procedure): Awake and Alert Pre-procedure Verification/Time Out Yes - 07:58 Taken: Start Time: 07:58 Pain Control: Lidocaine 4% T opical Solution T Area Debrided (L x W): otal 0.1 (cm) x 1.5 (cm) = 0.15 (cm) Tissue and other material debrided: Non-Viable, Callus, Skin: Epidermis Level: Skin/Epidermis Debridement Description: Selective/Open Wound Instrument: Curette Bleeding: Minimum Hemostasis Achieved: Pressure Response to Treatment: Procedure was  tolerated well Level of Consciousness (Post- Awake and Alert procedure): Post Debridement Measurements of Total Wound Length: (cm) 0.1 Width: (cm) 1.5 Depth: (cm) 0.9 Volume: (cm) 0.106 Character of Wound/Ulcer Post Debridement: Improved Severity of Tissue Post Debridement: Fat layer exposed Post Procedure Diagnosis Same as Pre-procedure [name], [name] ([id number]) [id number] Page 2 of 10 Notes scribed for Dr. Celine [name] by [name] Peals, RN Electronic Signature(s) Signed: 11/12/2021 8:23:17 AM By: [name] Maudlin MD FACS Signed: 11/12/2021 4:28:24 PM By: [name] By: [name] on 11/12/2021 08:15:51 -------------------------------------------------------------------------------- HPI Details Patient Name: Date of Service: [name], [name] 11/12/2021 7:45 [name] Medical Record Number: [id number] Patient Account Number: [id number] Date of Birth/Sex: Treating RN: March 30, 1970 (51 y.o. 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 The most recent amputation was in August 2022. At that operation, antibiotic beads were placed in the wound. He has been managed by podiatry for his procedures and management of his wounds. He has been in a Water engineer. He is on oral doxycycline. They have been using Betadine wet to dry dressings along with Iodosorb. The patient states that when he thinks the wound is getting too dry, he applies topical Neosporin. At his last visit, on June 7 of this year, the podiatrist determined that he felt the wound was stalled and referred him to wound  care for additional evaluation and management. An MRI has been ordered, but not yet scheduled or performed. Pathology from his operation in August 2022 demonstrated findings consistent with chronic osteomyelitis. Today, there is a large irregular wound on the plantar surface of his right foot, at about the level of the fifth metatarsal head. This tracks through to a pinpoint opening on the dorsal lateral portion of his foot. The  intake nurse reported purulent drainage. There is some malodor from the wound. No frank necrosis identified. 07/11/2021: Today, the wounds do not connect. I attempted multiple times from various directions and the shared tunnel is no longer open. He has some slough accumulation on the dorsal part of his foot as well as slough and callus buildup on the plantar surface. His MRI is scheduled for June 29. No purulent drainage or malodor appreciated today. 07/19/2021: The lateral foot wound has closed and there is no tunnel connecting the plantar foot wound to that site. The plantar foot wound still probes quite deeply, however. There is some slough, eschar, and nonviable tissue accumulated in the wound bed. No malodorous drainage present. His MRI was performed yesterday and is consistent with fairly extensive osteomyelitis. 07/29/2021: The patient has an appointment with infectious disease on July 18 to treat his osteomyelitis. The plantar wound still probes quite deeply, approaching bone. The orifice has narrowed quite substantially, however, making it more difficult for him to pack. 08/05/2021: The tunnel connecting the lateral foot wound to the plantar foot wound has reopened. He sees infectious disease tomorrow to discuss long-term antibiotic treatment for osteomyelitis. There was a bit of murky drainage in the wound, but this was noted after he had had topical lidocaine applied so may have just been a blob of the anesthetic. No odor or frank purulent drainage. The wound probes  deeply at the midfoot approaching bone. He does have MRI results confirming his diagnosis of osteomyelitis. He has failed to progress with conventional treatment and I think his best chance for preservation of the foot is to initiate hyperbaric oxygen therapy. 08/13/2021: The tunnel connecting the 2 wounds has closed again. He has a PICC line and is getting IV daptomycin and cefepime. EKG and chest x-ray are within normal limits. He is scheduled to start hyperbaric oxygen therapy tomorrow. The wound in his midfoot probes deeply, approaching bone. The skin at the orifice continues to heaped up and threatens to close over despite the large cavity within. No erythema, induration, or purulent drainage. The wound on his lateral foot is fairly small and quite clean. 08/19/2021: The lateral foot wound has nearly closed. The wound in his midfoot does not probe quite as deeply today. The skin at the orifice continues to try to roll in and obscure the opening. No frankly necrotic tissue appreciated. He is tolerating hyperbaric oxygen therapy. 08/26/2021: The lateral foot wound has closed completely. The wound in his midfoot is shallower again today. The wound orifice is contracting. We are using gentamicin and silver alginate. He continues on IV daptomycin and cefepime and is tolerating hyperbaric oxygen therapy without difficulty. 09/02/2021: His foot is a little bit sore today but he was up walking on it all weekend doing back to school shopping with his daughter. The tunneling is down to about 3 cm and I do not appreciate bone at the tip of the probe. The wound is clean but has some callus creating an overhanging lip at the distal aspect. He continues to receive hyperbaric oxygen therapy as well as IV daptomycin and cefepime. 09/09/2021: His wound continues to improve. The tunnel has come in by 0.9 cm. He continues to form callus around the orifice of the wound. He is tolerating hyperbaric oxygen therapy along  with his IV antibiotics. 09/16/2021: The tunneling is down to just half a centimeter. He continues to build up callus around the orifice of the wound. He completed his course of IV antibiotics and  his PICC line is scheduled to be removed this Friday. He is tolerating hyperbaric oxygen without difficulty. We have been applying topical gentamicin and silver alginate to his wound. 09/24/2021: The wound depth has come in even further. There is a little bit of callus buildup around the orifice. The opening is quite narrow, at this point. [name], [name] ([id number]) [id number] Page 3 of 10 09/30/2021: The wound depth is about the same this week. The opening continues to contract with callus accumulation. There is some discoloration of the skin on the lateral part of his foot and he admitted to walking more than usual this weekend and also wearing a different pair shoes. He continues to tolerate hyperbaric oxygen therapy without difficulty. 10/07/2021: The wound depth has contracted a bit and is now approximately 1 to 1.5 cm. The orifice of the wound continues to try and close in over the space. There is just some callus and skin around the margins obscuring the orifice. 10/14/2021: The wound depth has come in a little bit more. The orifice of the wound is rolling inwards with epithelium and callus, making it difficult to pack the wound. 10/21/2021: There is still 1 portion of the wound, at the lateral aspect, that is still a couple of centimeters deep. The architecture of the wound makes this somewhat challenging to access and pack. Everything else looks clean. He has his usual accumulation of callus and skin, narrowing the orifice. 10/29/2021: No significant change to the depth of the wound. The orifice continues to contract with callus and skin narrowing the opening. His wife has been packing the wound and doing an excellent job. 11/05/2021: The depth of the wound has come in by  about a centimeter. There is less callus accumulation around the orificedespite this, the wound is narrower today. We are still awaiting insurance approval for Kerecis powder. 11/12/2021: The depth of the wound continues to contract. He has accumulated some callus around the orifice, as usual. We have received a verbal confirmation that he is approved for Kerecis, but no formal paperwork has yet been received. Electronic Signature(s) Signed: 11/12/2021 8:18:39 AM By: [name] Maudlin MD FACS Entered By: [name] on 11/12/2021 08:18:39 -------------------------------------------------------------------------------- Physical Exam Details Patient Name: Date of Service: [name], [name] 11/12/2021 7:45 [name] Medical Record Number: [id number] Patient Account Number: [id number] Date of Birth/Sex: Treating RN: March 23, 1970 (51 y.o. [name] Primary Care Provider: [name] Other Clinician: Referring Provider: Treating Provider/Extender: [name] in Treatment: 18 Constitutional Hypertensive, asymptomatic. . . . No acute distress.Marland Kitchen Respiratory Normal work of breathing on room air.. Notes 11/12/2021: The depth of the wound continues to contract. He has accumulated some callus around the orifice, as usual. Electronic Signature(s) Signed: 11/12/2021 8:19:07 AM By: [name] Maudlin MD FACS Entered By: [name] on 11/12/2021 08:19:07 -------------------------------------------------------------------------------- Physician Orders Details Patient Name: Date of Service: [name], [name] 11/12/2021 7:45 [name] Medical Record Number: [id number] Patient Account Number: [id number] Date of Birth/Sex: Treating RN: 1970-09-06 [name] Primary Care Provider: [name] Other Clinician: Referring Provider: Treating Provider/Extender: [name] in Treatment: [phone] Verbal / Phone Orders: No Diagnosis  Coding ICD-10 [address] 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 -  Dr [name] Room 2 Return A Anesthetic Wound #2 [place] (In clinic) Topical Lidocaine 4% applied to wound bed Cellular or Tissue Based Products Cellular or Tissue Based Product Type: - run insurance for a skin substitute - Kerecis powder Bathing/ Shower/ Hygiene Other Bathing/Shower/Hygiene Orders/Instructions: - Change dressing after bathing Off-Loading Other: - Try to not stand on your feet too much Hyperbaric Oxygen Therapy Evaluate for HBO Therapy Indication: - wagner grade 3 diabetic foot ulcer right foot 2.0 ATA for 90 Minutes without A Breaks ir Total Number of Treatments: - 40 more treatments for a total of 80 09/30/21 One treatments per day (delivered Monday through Friday unless otherwise specified in Special Instructions below): Finger stick Blood Glucose Pre- and Post- HBOT Treatment. Follow Hyperbaric Oxygen Glycemia Protocol Afrin (Oxymetazoline HCL) 0.05% nasal spray - 1 spray in both nostrils daily as needed prior to HBO treatment for difficulty clearing ears Wound Treatment Wound #2 - Foot Wound Laterality: Plantar, Right, Lateral Cleanser: Soap and Water Every Other Day/15 Days Discharge Instructions: May shower and wash wound with dial antibacterial soap and water prior to dressing change. Cleanser: Wound Cleanser (Generic) Every Other Day/15 Days Discharge Instructions: Cleanse the wound with wound cleanser prior to applying a clean dressing using gauze sponges, not tissue or cotton balls. Peri-Wound Care: cotton tipped applicators (Generic) Every Other Day/15  Days Topical: Gentamicin Every Other Day/15 Days Discharge Instructions: As directed by physician Prim Dressing: Iodoform packing strip 1/4 (in) Every Other Day/15 Days ary Discharge Instructions: Lightly pack as instructed Secondary Dressing: Woven Gauze Sponge, Non-Sterile 4x4 in Every Other Day/15 Days Discharge Instructions: Apply over primary dressing as directed. Secured With: Paper Tape, 2x10 (in/yd) (Generic) Every Other Day/15 Days Discharge Instructions: Secure dressing with tape as directed. Compression Wrap: Kerlix Roll 4.5x3.1 (in/yd) (Generic) Every Other Day/15 Days Discharge Instructions: Apply Kerlix and Coban compression as directed. Patient Medications llergies: No Known Allergies A Notifications Medication Indication Start End 11/12/2021 lidocaine DOSE topical 4 % cream - cream topical GLYCEMIA INTERVENTIONS PROTOCOL PRE-HBO GLYCEMIA INTERVENTIONS ACTION INTERVENTION Obtain pre-HBO capillary blood glucose (ensure 1 physician order is in chart). A. Notify HBO physician and await physician orders. 2 If result is 70 mg/dl or below: B. If the result meets the hospital definition of a critical result, follow hospital policy. A. Give patient an 8 ounce Glucerna Shake, an 8 ounce Ensure, or 8 ounces of a [name], [name] ([id number]) [id number] Page 5 of 10 Glucerna/Ensure equivalent dietary supplement*. B. Wait 30 minutes. If result is 71 mg/dl to 130 mg/dl: C. Retest patients capillary blood glucose (CBG). D. If result greater than or equal to 110 mg/dl, proceed with HBO. If result less than 110 mg/dl, notify HBO physician and consider holding HBO. If result is 131 mg/dl to 249 mg/dl: A. Proceed with HBO. A. Notify HBO physician and await physician orders. B. It is recommended to hold HBO and do If result is 250 mg/dl or greater: blood/urine ketone testing. C. If the result meets the hospital definition of a critical result,  follow hospital policy. POST-HBO GLYCEMIA INTERVENTIONS ACTION INTERVENTION Obtain post HBO capillary blood glucose (ensure 1 physician order is in chart). A. Notify HBO physician and await physician orders. 2 If result is 70 mg/dl or below: B. If the result meets the hospital definition of a critical result, follow hospital policy. A. Give patient an 8 ounce Glucerna Shake, an 8 ounce Ensure, or 8 ounces of a Glucerna/Ensure equivalent dietary supplement*. B. Wait 15 minutes for  symptoms of If result is 71 mg/dl to 100 mg/dl: hypoglycemia (i.e. nervousness, anxiety, sweating, chills, clamminess, irritability, confusion, tachycardia or dizziness). C. If patient asymptomatic, discharge patient. If patient symptomatic, repeat capillary blood glucose (CBG) and notify HBO physician. If result is 101 mg/dl to 249 mg/dl: A. Discharge patient. A. Notify HBO physician and await physician orders. B. It is recommended to do blood/urine ketone If result is 250 mg/dl or greater: testing. C. If the result meets the hospital definition of a critical result, follow hospital policy. *Juice or candies are NOT equivalent products. If patient refuses the Glucerna or Ensure, please consult the hospital dietitian for an appropriate substitute. Electronic Signature(s) Signed: 11/12/2021 8:23:17 AM By: [name] Maudlin MD FACS Entered By: [name] on 11/12/2021 08:19:24 -------------------------------------------------------------------------------- Problem List Details Patient Name: Date of Service: [name], [name] 11/12/2021 7:45 [name] Medical Record Number: [id number] Patient Account Number: [id number] Date of Birth/Sex: Treating RN: 23-Jul-1970 (51 y.o. 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 3/54/6568 No Yes M86.671 Other chronic osteomyelitis, right ankle and foot 07/04/2021 No Yes L97.514 Non-pressure chronic ulcer of other part of right foot with necrosis of bone 07/04/2021 No Yes [name], [name] ([id number]) [id number] Page 6 of 10 Inactive Problems Resolved Problems Electronic Signature(s) Signed: 11/12/2021 8:17:39 AM By: [name] Maudlin MD FACS Entered By: [name] on 11/12/2021 08:17:38 -------------------------------------------------------------------------------- Progress Note Details Patient Name: Date of Service: [name], [name] 11/12/2021 7:45 [name] Medical Record Number: [id number] Patient Account Number: [id number] Date of Birth/Sex: Treating RN: October 11, 1970 (51 y.o. 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 The most recent amputation was in August 2022. At that operation, antibiotic beads were placed in the wound. He has been managed by podiatry for his procedures and management of his wounds. He has been in a Water engineer. He is on oral doxycycline. They have been using Betadine wet to dry dressings along with Iodosorb. The patient states that when he thinks the wound is getting too dry, he applies topical Neosporin. At his last visit, on June 7 of this year, the podiatrist determined that he felt the wound was stalled and referred him to wound care for  additional evaluation and management. An MRI has been ordered, but not yet scheduled or performed. Pathology from his operation in August 2022 demonstrated findings consistent with chronic osteomyelitis. Today, there is a large irregular wound on the plantar surface of his right foot, at about the level of the fifth metatarsal head. This tracks through to a pinpoint opening on the dorsal lateral portion of his foot. The intake nurse reported purulent drainage. There is some malodor from the wound. No frank necrosis identified. 07/11/2021: Today, the wounds do not connect. I attempted multiple times from various directions and the shared tunnel is no longer open. He has some slough accumulation on the dorsal part of his foot as well as slough  and callus buildup on the plantar surface. His MRI is scheduled for June 29. No purulent drainage or malodor appreciated today. 07/19/2021: The lateral foot wound has closed and there is no tunnel connecting the plantar foot wound to that site. The plantar foot wound still probes quite deeply, however. There is some slough, eschar, and nonviable tissue accumulated in the wound bed. No malodorous drainage present. His MRI was performed yesterday and is consistent with fairly extensive osteomyelitis. 07/29/2021: The patient has an appointment with infectious disease on July 18 to treat his osteomyelitis. The plantar wound still probes quite deeply, approaching bone. The orifice has narrowed quite substantially, however, making it more difficult for him to pack. 08/05/2021: The tunnel connecting the lateral foot wound to the plantar foot wound has reopened. He sees infectious disease tomorrow to discuss long-term antibiotic treatment for osteomyelitis. There was a bit of murky drainage in the wound, but this was noted after he had had topical lidocaine applied so may have just been a blob of the anesthetic. No odor or frank purulent drainage. The wound probes deeply at  the midfoot approaching bone. He does have MRI results confirming his diagnosis of osteomyelitis. He has failed to progress with conventional treatment and I think his best chance for preservation of the foot is to initiate hyperbaric oxygen therapy. 08/13/2021: The tunnel connecting the 2 wounds has closed again. He has a PICC line and is getting IV daptomycin and cefepime. EKG and chest x-ray are within normal limits. He is scheduled to start hyperbaric oxygen therapy tomorrow. The wound in his midfoot probes deeply, approaching bone. The skin at the orifice continues to heaped up and threatens to close over despite the large cavity within. No erythema, induration, or purulent drainage. The wound on his lateral foot is fairly small and quite clean. 08/19/2021: The lateral foot wound has nearly closed. The wound in his midfoot does not probe quite as deeply today. The skin at the orifice continues to try to roll in and obscure the opening. No frankly necrotic tissue appreciated. He is tolerating hyperbaric oxygen therapy. 08/26/2021: The lateral foot wound has closed completely. The wound in his midfoot is shallower again today. The wound orifice is contracting. We are using gentamicin and silver alginate. He continues on IV daptomycin and cefepime and is tolerating hyperbaric oxygen therapy without difficulty. 09/02/2021: His foot is a little bit sore today but he was up walking on it all weekend doing back to school shopping with his daughter. The tunneling is down to about 3 cm and I do not appreciate bone at the tip of the probe. The wound is clean but has some callus creating an overhanging lip at the distal aspect. He continues to receive hyperbaric oxygen therapy as well as IV daptomycin and cefepime. 09/09/2021: His wound continues to improve. The tunnel has come in by 0.9 cm. He continues to form callus around the orifice of the wound. He is tolerating hyperbaric oxygen therapy along with his IV  antibiotics. [name], [name] ([id number]) [id number] Page 7 of 10 09/16/2021: The tunneling is down to just half a centimeter. He continues to build up callus around the orifice of the wound. He completed his course of IV antibiotics and his PICC line is scheduled to be removed this Friday. He is tolerating hyperbaric oxygen without difficulty. We have been applying topical gentamicin and silver alginate to his wound. 09/24/2021: The wound depth has come in even further. There is a little bit of callus  buildup around the orifice. The opening is quite narrow, at this point. 09/30/2021: The wound depth is about the same this week. The opening continues to contract with callus accumulation. There is some discoloration of the skin on the lateral part of his foot and he admitted to walking more than usual this weekend and also wearing a different pair shoes. He continues to tolerate hyperbaric oxygen therapy without difficulty. 10/07/2021: The wound depth has contracted a bit and is now approximately 1 to 1.5 cm. The orifice of the wound continues to try and close in over the space. There is just some callus and skin around the margins obscuring the orifice. 10/14/2021: The wound depth has come in a little bit more. The orifice of the wound is rolling inwards with epithelium and callus, making it difficult to pack the wound. 10/21/2021: There is still 1 portion of the wound, at the lateral aspect, that is still a couple of centimeters deep. The architecture of the wound makes this somewhat challenging to access and pack. Everything else looks clean. He has his usual accumulation of callus and skin, narrowing the orifice. 10/29/2021: No significant change to the depth of the wound. The orifice continues to contract with callus and skin narrowing the opening. His wife has been packing the wound and doing an excellent job. 11/05/2021: The depth of the wound has come in by about a  centimeter. There is less callus accumulation around the orificeoodespite this, the wound is narrower today. We are still awaiting insurance approval for Kerecis powder. 11/12/2021: The depth of the wound continues to contract. He has accumulated some callus around the orifice, as usual. We have received a verbal confirmation that he is approved for Kerecis, but no formal paperwork has yet been received. Patient History Information obtained from Patient. Social History Never smoker, Marital Status - Married, Alcohol Use - Never, Drug Use - No History, Caffeine Use - Daily - T coffee. ea; Medical History Endocrine Patient has history of Type II Diabetes Hospitalization/Surgery History - Amuptation of 3rd,4th and 5th toes of Right foot;Oral Surgery;Anal Fissure surgery; Cholecystectomy. Objective Constitutional Hypertensive, asymptomatic. No acute distress.. Vitals Time Taken: 7:49 AM, Height: 72 in, Weight: 312 lbs, BMI: 42.3, Temperature: 98.9 F, Pulse: 82 bpm, Respiratory Rate: 16 breaths/min, Blood Pressure: 170/94 mmHg. Respiratory Normal work of breathing on room air.. General Notes: 11/12/2021: The depth of the wound continues to contract. He has accumulated some callus around the orifice, as usual. Integumentary (Hair, Skin) Wound #2 status is Open. Original cause of wound was Gradually Appeared. The date acquired was: 12/19/2016. The wound has been in treatment 18 weeks. The wound is located on the [place]. The wound measures 0.1cm length x 1.5cm width x 0.9cm depth; 0.118cm^2 area and 0.106cm^3 volume. There is Fat Layer (Subcutaneous Tissue) exposed. There is no tunneling or undermining noted. There is a medium amount of serosanguineous drainage noted. The wound margin is thickened. There is large (67-100%) pink granulation within the wound bed. There is a small (1-33%) amount of necrotic tissue within the wound bed including Adherent Slough. The periwound  skin appearance had no abnormalities noted for moisture. The periwound skin appearance had no abnormalities noted for color. The periwound skin appearance exhibited: Callus. Periwound temperature was noted as No Abnormality. 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 There was a Selective/Open Wound Skin/Epidermis Debridement with a total area of 0.15 sq cm performed by [name] Maudlin, MD. With the following instrument(s): Curette to remove Non-Viable tissue/material. Material removed includes Callus and Skin: Epidermis and after achieving pain control using Lidocaine 4% Topical Solution. No specimens were taken. A time out was conducted at 07:58, prior to the start of the procedure. A Minimum amount of bleeding was controlled with Pressure. The procedure was tolerated well. Post Debridement Measurements: 0.1cm length x 1.5cm width x 0.9cm depth; 0.106cm^3 volume. Character of Wound/Ulcer Post Debridement is improved. Severity of Tissue Post Debridement is: Fat layer exposed. Post procedure Diagnosis Wound #2: Same as Pre-Procedure General Notes: scribed for Dr. Celine [name] by [name] Peals, RN. Plan Follow-up Appointments: Return Appointment in 1 week. - Dr [name] Room 2 Anesthetic: Wound #2 Right,Lateral,Plantar Foot: (In clinic) Topical Lidocaine 4% applied to wound bed Cellular or Tissue Based Products: Cellular or Tissue Based Product Type: - run insurance for a skin substitute - Kerecis powder Bathing/ Shower/  Hygiene: Other Bathing/Shower/Hygiene Orders/Instructions: - Change dressing after bathing Off-Loading: Other: - Try to not stand on your feet too much Hyperbaric Oxygen Therapy: Evaluate for HBO Therapy Indication: - wagner grade 3 diabetic foot ulcer right foot 2.0 ATA for 90 Minutes without Air Breaks T Number of Treatments: - 40 more treatments for a total of 80 09/30/21 otal One treatments per day (delivered Monday through Friday unless otherwise specified in Special Instructions below): Finger stick Blood Glucose Pre- and Post- HBOT Treatment. Follow Hyperbaric Oxygen Glycemia Protocol Afrin (Oxymetazoline HCL) 0.05% nasal spray - 1 spray in both nostrils daily as needed prior to HBO treatment for difficulty clearing ears The following medication(s) was prescribed: lidocaine topical 4 % cream cream topical was prescribed at facility WOUND #2: - Foot Wound Laterality: Plantar, Right, Lateral Cleanser: Soap and Water Every Other Day/15 Days Discharge Instructions: May shower and wash wound with dial antibacterial soap and water prior to dressing change. Cleanser: Wound Cleanser (Generic) Every Other Day/15 Days Discharge Instructions: Cleanse the wound with wound cleanser prior to applying a clean dressing using gauze sponges, not tissue or cotton balls. Peri-Wound Care: cotton tipped applicators (Generic) Every Other Day/15 Days Topical: Gentamicin Every Other Day/15 Days Discharge Instructions: As directed by physician Prim Dressing: Iodoform packing strip 1/4 (in) Every Other Day/15 Days ary Discharge Instructions: Lightly pack as instructed Secondary Dressing: Woven Gauze Sponge, Non-Sterile 4x4 in Every Other Day/15 Days Discharge Instructions: Apply over primary dressing as directed. Secured With: Paper T ape, 2x10 (in/yd) (Generic) Every Other Day/15 Days Discharge Instructions: Secure dressing with tape as directed. Com pression Wrap: Kerlix Roll 4.5x3.1 (in/yd) (Generic)  Every Other Day/15 Days Discharge Instructions: Apply Kerlix and Coban compression as directed. 11/12/2021: The depth of the wound continues to contract. He has accumulated some callus around the orifice, as usual. I used a curette to debride callus and skin from around the orifice of the wound. The rest of the wound is very clean. We will continue to pack the wound with gentamicin impregnated iodoform packing strips. He continues on suppressive oral antibiotics per infectious disease. Hopefully we will have formal approval for Kerecis in time for his visit next week. Electronic Signature(s) Signed: 11/12/2021 8:20:11 AM By: [name] Maudlin MD FACS Entered By: [name],  Samarie Pinder on 11/12/2021 08:20:11 HxROS Details -------------------------------------------------------------------------------- [name], [name] ([id number]) [id number] Page 9 of 10 Patient Name: Date of Service: [name], [name] 11/12/2021 7:45 [name] Medical Record Number: [id number] Patient Account Number: [id number] Date of Birth/Sex: Treating RN: 14-Apr-1970 (51 y.o. [name] Primary Care Provider: [name] Other Clinician: Referring Provider: Treating Provider/Extender: [name] in Treatment: 18 Information Obtained From Patient Endocrine Medical History: Positive for: Type II Diabetes Immunizations Pneumococcal Vaccine: Received Pneumococcal Vaccination: Yes Received Pneumococcal Vaccination On or After 60th Birthday: No Implantable Devices None Hospitalization / Surgery History Type of Hospitalization/Surgery Amuptation of 3rd,4th and 5th toes of Right foot;Oral Surgery;Anal Fissure surgery; Cholecystectomy Family and Social History Never smoker; Marital Status - Married; Alcohol Use: Never; Drug Use: No History; Caffeine Use: Daily - T coffee; Financial Concerns: No; Food, ea; Clothing or Shelter Needs: No; Support System Lacking: No; Transportation  Concerns: No Electronic Signature(s) Signed: 11/12/2021 8:23:17 AM By: [name] Maudlin MD FACS Entered By: [name] on 11/12/2021 08:18:46 -------------------------------------------------------------------------------- SuperBill Details Patient Name: Date of Service: [name], [name] 11/12/2021 Medical Record Number: [id number] Patient Account Number: [id number] Date of Birth/Sex: Treating RN: Mar 05, 1970 (51 y.o. [name] Primary Care Provider: [name] Other Clinician: Referring Provider: Treating Provider/Extender: [name] in Treatment: 18 Diagnosis Coding ICD-10 Codes Code Description E11.49 Type 2 diabetes mellitus with other diabetic neurological complication T70.017 Other chronic osteomyelitis, right ankle and foot L97.514 Non-pressure chronic ulcer of other part of right foot with necrosis of bone Facility Procedures : CPT4 Code: [id number] Description: 91638 - DEBRIDE WOUND 1ST 20 SQ CM OR < ICD-10 Diagnosis Description L97.514 Non-pressure chronic ulcer of other part of right foot with necrosis of bone Modifier: Quantity: 1 Physician Procedures : CPT4 Code Description Modifier [id number] - WC PHYS LEVEL 4 - EST PT 25 [name], [name] ([id number]) [id number] ICD-10 Diagnosis Description L97.514 Non-pressure chronic ulcer of other part of right foot with necrosis of bone  M86.671 Other chronic osteomyelitis, right ankle and foot E11.49 Type 2 diabetes mellitus with other diabetic neurological complication Quantity: 1 27.pdf Page 10 of 10 : [id number] - WC PHYS DEBR WO ANESTH 20 SQ CM 1 ICD-10 Diagnosis Description L97.514 Non-pressure chronic ulcer of other part of right foot with necrosis of bone Quantity: Electronic Signature(s) Signed: 11/12/2021 8:20:35 AM By: [name] Maudlin MD FACS Entered By: [name] on 11/12/2021 08:20:35

## 2021-11-12 NOTE — Progress Notes (Signed)
[name], [name] [id number] Page 1 of 7 Visit Report for 11/12/2021 Arrival Information Details Patient Name: Date of Service: [name] 11/12/2021 7:45 [name] Medical Record Number: [id number] Patient Account Number: [id number] Date of Birth/Sex: Treating RN: 1970-03-10 (51 [name] Primary Care Leslyn Monda: [name] Other Clinician: Referring Hang Ammon: Treating Christop Hippert/Extender: [name]8 Visit Information History Since Last Visit Added or deleted any medications: Yes Patient Arrived: Ambulatory Any new allergies or adverse reactions: No Arrival Time: 07:49 Had a fall or experienced change [name] No Accompanied By: self activities of daily living that may affect Transfer Assistance: None risk of falls: Patient Identification Verified: Yes Signs or symptoms of abuse/neglect since last visito No Secondary Verification Process Completed: Yes Hospitalized since last visit: Yes Patient Requires Transmission-Based Precautions: No Implantable device outside of the clinic excluding No Patient Has Alerts: Yes cellular tissue based products placed [name] the center since last visit: Has Dressing [name] Place as Prescribed: Yes Pain Present Now: No Electronic Signature(s) Signed: 11/12/2021 4:28:24 PM By: Adline Peals Entered By: Adline Peals on 11/12/2021 07:49:49 -------------------------------------------------------------------------------- Encounter Discharge Information Details Patient Name: Date of Service: [name] 11/12/2021 7:45 [name] Medical Record Number: [id number] Patient Account Number: [id number] Date of Birth/Sex: Treating RN: Oct 31, 1970 (51 [name] Primary Care Puanani Gene: [name] Other Clinician: Referring Zayanna Pundt: Treating [name]/Extender: [name]8 Encounter Discharge  Information Items Post Procedure Vitals Discharge Condition: Stable Temperature (F): 98.9 Ambulatory Status: Ambulatory Pulse (bpm): 88 Discharge Destination: Home Respiratory Rate (breaths/min): 16 Transportation: Private Auto Blood Pressure (mmHg): 156/93 Accompanied By: self Schedule Follow-up Appointment: Yes Clinical Summary of Care: Patient Declined Electronic Signature(s) Signed: 11/12/2021 4:28:24 PM By: Adline Peals Entered By: Adline Peals on 11/12/2021 08:22:44 [name], [name] ([id number]) [id number] Page 2 of 7 -------------------------------------------------------------------------------- Lower Extremity Assessment Details Patient Name: Date of Service: [name] [name] 11/12/2021 7:45 [name] Medical Record Number: [id number] Patient Account Number: [id number] Date of Birth/Sex: Treating RN: 1970-04-04 (51 [name] Primary Care Yon Schiffman: [name] Other Clinician: Referring Novalee Horsfall: Treating Taytum Wheller/Extender: [name]8 Edema Assessment Assessed: [name]: No] [name]: No] Edema: [Left: [name]] [Right: o] Calf Left: Right: Point of Measurement: From Medial Instep 39.5 cm Ankle Left: Right: Point of Measurement: From Medial Instep 24.4 cm Vascular Assessment Pulses: Dorsalis Pedis Palpable: [Right:Yes] Electronic Signature(s) Signed: 11/12/2021 4:28:24 PM By: Adline Peals Entered By: Adline Peals on 11/12/2021 07:52:02 -------------------------------------------------------------------------------- Multi Wound Chart Details Patient Name: Date of Service: [name] [name] 11/12/2021 7:45 [name] Medical Record Number: [id number] Patient Account Number: [id number] Date of Birth/Sex: Treating RN: May 01, 1970 (51 [name].o. [name] Primary Care Leon Goodnow: [name] Other Clinician: Referring Luvenia Cranford: Treating Rechelle Niebla/Extender: [name]8 Vital Signs Height([name]: 72 Pulse(bpm): 82 Weight(lbs): 312 Blood Pressure(mmHg): 170/94 Body Mass Index(BMI): 42.3 Temperature(F): 98.9 Respiratory Rate(breaths/min): 16 [2:Photos:] [[name]:[name]] Right, Lateral, Plantar Foot [name] Wound Location: Gradually Appeared [name] Wounding Event: Diabetic Wound/Ulcer of the Lower [name] Primary Etiology: Extremity Type II Diabetes [name] Comorbid History: 12/19/2016 [name] Date Acquired: 38 [name] Weeks of Treatment: Open [name] Wound Status: No [name] Wound Recurrence: 0.1x1.5x0.9 [name] Measurements L x W x D (cm) 0.118 [name] A (cm) : rea 0.106 [name] Volume (cm) : 96.70% [name] % Reduction [name] A rea: 99.40%  [name] % Reduction [name] Volume: Grade 3 [name] Classification: Medium [name] Exudate A mount: Serosanguineous [name] Exudate Type: red, brown [name] Exudate Color: Thickened [name] Wound Margin: Large (67-100%) [name] Granulation A mount: Pink [name] Granulation Quality: Small (1-33%) [name] Necrotic A mount: Fat Layer (Subcutaneous Tissue): Yes [name] Exposed Structures: Fascia: No Tendon: No Muscle: No Joint: No Bone: No Small (1-33%) [name] Epithelialization: Debridement - Selective/Open Wound [name] Debridement: Pre-procedure Verification/Time Out 07:58 [name] Taken: Lidocaine 4% Topical Solution [name] Pain Control: Callus [name] Tissue Debrided: Skin/Epidermis [name] Level: 0.15 [name] Debridement A (sq cm): rea Curette [name] Instrument: Minimum [name] Bleeding: Pressure [name] Hemostasis A chieved: Procedure was tolerated well [name] Debridement Treatment Response: 0.1x1.5x0.9 [name] Post Debridement Measurements L x W x D (cm) 0.106 [name] Post Debridement Volume: (cm) Callus: Yes [name] Periwound Skin Texture: No Abnormalities Noted [name] Periwound Skin Moisture: No Abnormalities Noted [name]  [name] Periwound Skin Color: No Abnormality [name] Temperature: Debridement [name] Procedures Performed: Treatment Notes Electronic Signature(s) Signed: 11/12/2021 8:17:45 AM By: [name] MD FACS Entered By: [name] on 11/12/2021 08:17:44 -------------------------------------------------------------------------------- Multi-Disciplinary Care Plan Details Patient Name: Date of Service: [name] [name] 11/12/2021 7:45 [name] Medical Record Number: [id number] Patient Account Number: [id number] Date of Birth/Sex: Treating RN: 1970/09/22 (72 [name] Primary Care Ahmir Bracken: [name] Other Clinician: Referring Avaline Stillson: Treating Solmon Bohr/Extender: [name]: [place] reviewed with physician Active Inactive Nutrition [name], [name] ([id number]) [id number] Page 4 of 7 Nursing Diagnoses: Impaired glucose control: actual or potential Potential for alteratiion [name] Nutrition/Potential for imbalanced nutrition Goals: Patient/caregiver will maintain therapeutic glucose control Date Initiated: 07/29/2021 Target Resolution Date: 12/20/2021 Goal Status: Active Interventions: Assess patient nutrition upon admission and as needed per policy Provide education on elevated blood sugars and impact on wound healing Treatment Activities: Patient referred to Primary Care Physician for further nutritional evaluation : 07/29/2021 Notes: Osteomyelitis Nursing Diagnoses: Infection: osteomyelitis Knowledge deficit related to disease process and management Goals: Patient/caregiver will verbalize understanding of disease process and disease management Date Initiated: 07/29/2021 Date Inactivated: 11/12/2021 Target Resolution Date: 11/19/2021 Goal Status: Met Patient's osteomyelitis will resolve Date Initiated: 07/29/2021 Target Resolution Date: 12/20/2021 Goal Status:  Active Interventions: Assess for signs and symptoms of osteomyelitis resolution every visit Provide education on osteomyelitis Treatment Activities: Consult for HBO : 07/29/2021 Notes: Wound/Skin Impairment Nursing Diagnoses: Impaired tissue integrity Goals: Patient/caregiver will verbalize understanding of skin care regimen Date Initiated: 07/04/2021 Target Resolution Date: 12/20/2021 Goal Status: Active Interventions: Assess ulceration(s) every visit Treatment Activities: Skin care regimen initiated : 07/04/2021 Notes: Electronic Signature(s) Signed: 11/12/2021 4:28:24 PM By: Adline Peals Entered By: Adline Peals on 11/12/2021 08:16:28 -------------------------------------------------------------------------------- Pain Assessment Details Patient Name: Date of Service: [name] [name] 11/12/2021 7:45 [name] Medical Record Number: [id number] Patient Account Number: [id number] Date of Birth/Sex: Treating RN: 1970-08-18 (34 [name] Primary Care Dalayla Aldredge: [name] Other Clinician: [name], [name] ([id number]) [id number] Page 5 of 7 Referring Alandra Sando: Treating Brunette Lavalle/Extender: [name]8 Active Problems Location of Pain Severity and Description of Pain Patient Has Paino No Site Locations Rate the pain. Current Pain Level: 0 Pain Management and Medication Current Pain Management: Electronic Signature(s) Signed: 11/12/2021 4:28:24 PM By: Adline Peals Entered By: Adline Peals on 11/12/2021 07:50:13 -------------------------------------------------------------------------------- Patient/Caregiver  Education Details Patient Name: Date of Service: [place], [place] [name] 10/24/2023andnbsp7:45 [name] Medical Record Number: [id number] Patient Account Number: [id number] Date of Birth/Gender: Treating RN: 02-17-1970 (15 [name] Primary Care Physician:  [name] Other Clinician: Referring Physician: Treating Physician/Extender: [name]8 Education Assessment Education Provided To: Patient Education Topics Provided Wound/Skin Impairment: Methods: Explain/Verbal Responses: Reinforcements needed, State content correctly Electronic Signature(s) Signed: 11/12/2021 4:28:24 PM By: Adline Peals Entered By: Adline Peals on 11/12/2021 08:16:39 [name], [name] ([id number]) [id number] Page 6 of 7 -------------------------------------------------------------------------------- Wound Assessment Details Patient Name: Date of Service: [name] [name] 11/12/2021 7:45 [name] Medical Record Number: [id number] Patient Account Number: [id number] Date of Birth/Sex: Treating RN: 1970-08-14 (89 [name] Primary Care Luman Holway: [name] Other Clinician: Referring Jurni Cesaro: Treating Caley Ciaramitaro/Extender: [name]8 Wound Status Wound Number: 2 Primary Etiology: Diabetic Wound/Ulcer of the Lower Extremity Wound Location: Right, Lateral, Plantar Foot Wound Status: Open Wounding Event: Gradually Appeared Comorbid History: Type II Diabetes Date Acquired: 12/19/2016 Weeks Of Treatment: 18 Clustered Wound: No Photos Wound Measurements Length: (cm) Width: (cm) Depth: (cm) Area: (cm) Volume: (cm) 0.1 % Reduction [name] Area: 96.7% 1.5 % Reduction [name] Volume: 99.4% 0.9 Epithelialization: Small (1-33%) 0.118 Tunneling: No 0.106 Undermining: No Wound Description Classification: Grade 3 Wound Margin: Thickened Exudate Amount: Medium Exudate Type: Serosanguineous Exudate Color: red, brown Foul Odor After Cleansing: No Slough/Fibrino Yes Wound Bed Granulation Amount: Large (67-100%) Exposed Structure Granulation Quality: Pink Fascia Exposed: No Necrotic Amount: Small (1-33%) Fat Layer  (Subcutaneous Tissue) Exposed: Yes Necrotic Quality: Adherent Slough Tendon Exposed: No Muscle Exposed: No Joint Exposed: No Bone Exposed: No Periwound Skin Texture Texture Color No Abnormalities Noted: No No Abnormalities Noted: Yes Callus: Yes Temperature / Pain Temperature: No Abnormality Moisture No Abnormalities Noted: Yes Treatment Notes Wound #2 (Foot) Wound Laterality: Plantar, Right, Lateral [address] and [address] [name], [name] ([id number]) [id number] Page 7 of 7 Discharge Instruction: May shower and wash wound with dial antibacterial soap and water prior to dressing change. Wound Cleanser Discharge Instruction: Cleanse the wound with wound cleanser prior to applying a clean dressing using gauze sponges, not tissue or cotton balls. Peri-Wound Care cotton tipped applicators Topical Gentamicin Discharge Instruction: As directed by physician Primary Dressing Iodoform packing strip 1/4 ([name] Discharge Instruction: Lightly pack as instructed Secondary Dressing Woven Gauze Sponge, Non-Sterile 4x4 [name] Discharge Instruction: Apply over primary dressing as directed. Secured With Paper Tape, 2x10 ([name]/yd) Discharge Instruction: Secure dressing with tape as directed. Compression Wrap Kerlix Roll 4.5x3.1 ([name]/yd) Discharge Instruction: Apply Kerlix and Coban compression as directed. Compression Stockings Add-Ons Electronic Signature(s) Signed: 11/12/2021 4:28:24 PM By: Adline Peals Entered By: Adline Peals on 11/12/2021 07:55:10 -------------------------------------------------------------------------------- Vitals Details Patient Name: Date of Service: [name] [name] 11/12/2021 7:45 [name] Medical Record Number: [id number] Patient Account Number: [id number] Date of Birth/Sex: Treating RN: 05/26/1970 (34 [name] Primary Care Parris Cudworth: [name] Other Clinician: Referring Jalaiyah Throgmorton: Treating  [name]/Extender: [name]8 Vital Signs Time Taken: 07:49 Temperature (F): 98.9 Height ([name]: 72 Pulse (bpm): 82 Weight (lbs): 312 Respiratory Rate (breaths/min): 16 Body Mass Index (BMI): 42.3 Blood Pressure (mmHg): 170/94 Reference Range: 80 - 120 mg / dl Electronic Signature(s) Signed: 11/12/2021 4:28:24 PM By: Adline Peals Entered By: Adline Peals on 11/12/2021 07:50:07

## 2021-11-12 NOTE — Progress Notes (Signed)
[name], [name] ([id number]) [id number] Page 1 of 2 Visit Report for 11/12/2021 Problem List Details Patient Name: Date of Service: [name], [name] 11/12/2021 8:30 A M Medical Record Number: [id number] Patient Account Number: [id number] Date of Birth/Sex: Treating RN: 15-Aug-1970 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 11/12/2021 Medical Record Number: [id number] Patient Account Number: [id number] Date of Birth/Sex: Treating RN: 1970-10-24 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 Y23.343 Other chronic osteomyelitis, right ankle and foot L97.514 Non-pressure chronic ulcer of other part of right foot with necrosis of bone [name], [name] ([id number]) [id number] Page 2  of 2 Facility Procedures : CPT4 Code Description: [id number] G0277-(Facility Use Only) HBOT full body chamber, 51mn , ICD-10 Diagnosis Description E11.49 Type 2 diabetes mellitus with other diabetic neurolog M86.671 Other chronic osteomyelitis, right ankle and foot L97.514  Non-pressure chronic ulcer of other part of right foo Modifier: ical complication t with necrosis o Quantity: 4 f bone Physician Procedures : CPT4 Code Description Modifier [id number] 33612- WC PHYS HYPERBARIC OXYGEN THERAPY ICD-10 Diagnosis Description 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

## 2021-11-12 NOTE — Progress Notes (Signed)
[name], [name] ([id number]) [id number] Page 1 of 2 Visit Report for 11/12/2021 Arrival Information Details Patient Name: Date of Service: [name], [name] 11/12/2021 8:30 [name] Medical Record Number: [id number] Patient Account Number: [id number] Date of Birth/Sex: Treating RN: [name] [name] Primary Care [name]: [name] Other Clinician: Valeria [name] Referring [name]: Treating [name]/Extender: [name] [name]8 Visit Information History Since Last Visit All ordered tests and consults were completed: Yes Patient Arrived: Ambulatory Added or deleted any medications: No Arrival Time: 07:45 Any new allergies or adverse reactions: No Accompanied By: None Had a fall or experienced change [name] No Transfer Assistance: None activities of daily living that may affect Patient Identification Verified: Yes risk of falls: Secondary Verification Process Completed: Yes Signs or symptoms of abuse/neglect since last visito No Patient Requires Transmission-Based Precautions: No Hospitalized since last visit: No Patient Has Alerts: Yes Implantable device outside of the clinic excluding No cellular tissue based products placed [name] the center since last visit: Pain Present Now: No Notes The patient had a [facility] visit before his treatment today. Electronic Signature(s) Signed: 11/12/2021 11:15:47 AM By: [name] EMT Entered By: [name] on 11/12/2021 11:15:47 -------------------------------------------------------------------------------- Encounter Discharge Information Details Patient Name: Date of Service: [name], [name] 11/12/2021 8:30 [name] Medical Record Number: [id number] Patient Account Number: [id number] Date of Birth/Sex: Treating RN: [name], [name] [name] Primary Care [name]: [name] Other Clinician: Valeria [name] Referring  [name]: Treating [name]/Extender: [name] [name]8 Encounter Discharge Information Items Discharge Condition: Stable Ambulatory Status: Ambulatory Discharge Destination: Home Transportation: Private Auto Accompanied By: None Schedule Follow-up Appointment: Yes Clinical Summary of Care: Electronic Signature(s) Signed: 11/12/2021 11:19:39 AM By: [name] EMT Entered By: [name] on 11/12/2021 11:19:39 [name], [name] ([id number]) [id number] Page 2 of 2 -------------------------------------------------------------------------------- Vitals Details Patient Name: Date of Service: [name], [name] 11/12/2021 8:30 [name] Medical Record Number: [id number] Patient Account Number: [id number] Date of Birth/Sex: Treating RN: [name] [name] Primary Care [name]: [name] Other Clinician: Valeria [name] Referring [name]: Treating [name]/Extender: [name] [name]8 Vital Signs Time Taken: 08:22 Temperature (F): 98.9 Height ([name]: 72 Pulse (bpm): 88 Weight (lbs): 312 Respiratory Rate (breaths/min): 18 Body Mass Index (BMI): 42.3 Blood Pressure (mmHg): 156/93 Capillary Blood Glucose (mg/dl): 185 Reference Range: 80 - 120 mg / dl Electronic Signature(s) Signed: 11/12/2021 11:16:39 AM By: [name] EMT Entered By: [name] on 11/12/2021 11:16:39

## 2021-11-12 NOTE — Progress Notes (Addendum)
[name], [name] ([id number]) [id number] Page 1 of 2 Visit Report for 11/12/2021 HBO Details Patient Name: Date of Service: [name], [place] [name] 11/12/2021 8:30 A M Medical Record Number: [id number] Patient Account Number: [id number] Date of Birth/Sex: Treating RN: 1970/06/16 (51 y.o. [name] Primary Care Latoyia Tecson: [name] Other Clinician: [name] Referring Toron Bowring: Treating Jaylianna Tatlock/Extender: [name] in Treatment: 18 HBO Treatment Course Details Treatment Course Number: 1 Ordering Walta [place]: [name] T Treatments Ordered: otal 80 HBO Treatment Start Date: 08/14/2021 HBO Indication: Diabetic Ulcer(s) of the Lower Extremity Standard/Conservative Wound Care tried and failed greater than or equal to 30 days HBO Treatment Details Treatment Number: 62 Patient Type: Outpatient Chamber Type: Monoplace Chamber Serial #: [id number] Treatment Protocol: 2.0 ATA with 90 minutes oxygen, and no air breaks Treatment Details Compression Rate Down: 2.0 psi / minute De-Compression Rate Up: 2.0 psi / minute Air breaks and breathing Decompress Decompress Compress Tx Pressure Begins Reached periods Begins Ends (leave unused spaces blank) Chamber Pressure (ATA 1 2 ------2 1 ) Clock Time (24 hr) 08:25 08:36 - - - - - - 10:07 10:14 Treatment Length: 109 (minutes) Treatment Segments: 4 Vital Signs Capillary Blood Glucose Reference Range: 80 - 120 mg / dl HBO Diabetic Blood Glucose Intervention Range: <131 mg/dl or >249 mg/dl Time Vitals Blood Respiratory Capillary Blood Glucose Pulse Action Type: Pulse: Temperature: Taken: Pressure: Rate: Glucose (mg/dl): Meter #: Oximetry (%) Taken: Pre 08:22 156/93 88 18 98.9 185 Post 10:22 158/88 79 16 97.9 151 Treatment Response Treatment Toleration: Well Treatment Completion Status: Treatment Completed without Adverse Event Additional Procedure  Documentation Tissue Sevierity: Necrosis of bone Physician HBO Attestation: I certify that I supervised this HBO treatment in accordance with Medicare guidelines. A trained emergency response team is readily available per Yes hospital policies and procedures. Continue HBOT as ordered. Yes Electronic Signature(s) Signed: 11/12/2021 11:59:29 AM By: [name] MD FACS Previous Signature: 11/12/2021 11:18:43 AM Version By: [name] EMT Entered By: [name] on 11/12/2021 11:59:28 [name], [name] ([id number]) [id number] Page 2 of 2 -------------------------------------------------------------------------------- HBO Safety Checklist Details Patient Name: Date of Service: [name], [name] 11/12/2021 8:30 A M Medical Record Number: [id number] Patient Account Number: [id number] Date of Birth/Sex: Treating RN: 1970-08-27 (51 y.o. [name] Primary Care Demani Weyrauch: [name] Other Clinician: [name] Referring Emireth Cockerham: Treating Molley Houser/Extender: [name] in Treatment: 18 HBO Safety Checklist Items Safety Checklist Consent Form Signed Patient voided / foley secured and emptied When did you last eato 0700 Last dose of injectable or oral agent 0655 Ostomy pouch emptied and vented if applicable NA All implantable devices assessed, documented and approved NA Intravenous access site secured and place NA Valuables secured Linens and cotton and cotton/polyester blend (less than 51% polyester) Personal oil-based products / skin lotions / body lotions removed Wigs or hairpieces removed NA Smoking or tobacco materials removed Books / newspapers / magazines / loose paper removed Cologne, aftershave, perfume and deodorant removed Jewelry removed (may wrap wedding band) Make-up removed NA Hair care products removed Battery operated devices (external) removed Heating patches and chemical warmers  removed Titanium eyewear removed NA Nail polish cured greater than 10 hours NA Casting material cured greater than 10 hours NA Hearing aids removed NA Loose dentures or partials removed NA Prosthetics have been removed NA Patient demonstrates correct use of air break device (if applicable) Patient concerns have been addressed Patient grounding bracelet on and cord attached to chamber  Specifics for Inpatients (complete in addition to above) Medication sheet sent with patient NA Intravenous medications needed or due during therapy sent with patient NA Drainage tubes (e.g. nasogastric tube or chest tube secured and vented) NA Endotracheal or Tracheotomy tube secured NA Cuff deflated of air and inflated with saline NA Airway suctioned NA Notes The safety checklist was done before the treatment was started. Electronic Signature(s) Signed: 11/12/2021 11:17:36 AM By: [name] EMT Entered By: [name] on 11/12/2021 11:17:36

## 2021-11-13 ENCOUNTER — Encounter (HOSPITAL_BASED_OUTPATIENT_CLINIC_OR_DEPARTMENT_OTHER): Payer: BLUE CROSS/BLUE SHIELD | Admitting: General Surgery

## 2021-11-13 DIAGNOSIS — E1169 Type 2 diabetes mellitus with other specified complication: Secondary | ICD-10-CM | POA: Diagnosis not present

## 2021-11-13 LAB — GLUCOSE, CAPILLARY
Glucose-Capillary: 154 mg/dL — ABNORMAL HIGH (ref 70–99)
Glucose-Capillary: 174 mg/dL — ABNORMAL HIGH (ref 70–99)

## 2021-11-13 NOTE — Progress Notes (Addendum)
[name], [name] ([id number]) [id number] Page 1 of 2 Visit Report for 11/13/2021 HBO Details Patient Name: Date of Service: [name], [name] 11/13/2021 8:00 A M Medical Record Number: [id number] Patient Account Number: [id number] Date of Birth/Sex: Treating RN: [birth year] [name] Primary Care [name]: [name] Other Clinician: Valeria [name] Referring [name]: Treating [name]/Extender: [name] in Treatment: 18 HBO Treatment Course Details Treatment Course Number: 1 Ordering [name]: [name] T Treatments Ordered: otal 80 HBO Treatment Start Date: 08/14/2021 HBO Indication: Diabetic Ulcer(s) of the Lower Extremity Standard/Conservative Wound Care tried and failed greater than or equal to 30 days HBO Treatment Details Treatment Number: 63 Patient Type: Outpatient Chamber Type: Monoplace Chamber Serial #: [id number] Treatment Protocol: 2.0 ATA with 90 minutes oxygen, and no air breaks Treatment Details Compression Rate Down: 2.0 psi / minute De-Compression Rate Up: 2.0 psi / minute Air breaks and breathing Decompress Decompress Compress Tx Pressure Begins Reached periods Begins Ends (leave unused spaces blank) Chamber Pressure (ATA 1 2 ------2 1 ) Clock Time (24 hr) 08:15 08:24 - - - - - - 09:55 10:03 Treatment Length: 108 (minutes) Treatment Segments: 4 Vital Signs Capillary Blood Glucose Reference Range: 80 - 120 mg / dl HBO Diabetic Blood Glucose Intervention Range: <131 mg/dl or >249 mg/dl Time Vitals Blood Respiratory Capillary Blood Glucose Pulse Action Type: Pulse: Temperature: Taken: Pressure: Rate: Glucose (mg/dl): Meter #: Oximetry (%) Taken: Pre 08:10 137/80 83 20 98.2 154 Post 10:10 150/100 81 18 98.1 174 Treatment Response Treatment Toleration: Well Treatment Completion Status: Treatment Completed without Adverse Event Additional Procedure  Documentation Tissue Sevierity: Necrosis of bone Physician HBO Attestation: I certify that I supervised this HBO treatment in accordance with Medicare guidelines. A trained emergency response team is readily available per Yes hospital policies and procedures. Continue HBOT as ordered. Yes Electronic Signature(s) Signed: 11/13/2021 3:07:46 PM By: [name] Maudlin MD FACS Previous Signature: 11/13/2021 1:47:57 PM Version By: [name] EMT Entered By: [name] on 11/13/2021 15:07:46 [name], [name] ([id number]) [id number] Page 2 of 2 -------------------------------------------------------------------------------- HBO Safety Checklist Details Patient Name: Date of Service: [name], [name] 11/13/2021 8:00 A M Medical Record Number: [id number] Patient Account Number: [id number] Date of Birth/Sex: Treating RN: 1970/10/28 [name] Primary Care [name]: [name] Other Clinician: Valeria [name] Referring [name]: Treating [name]/Extender: [name] in Treatment: 18 HBO Safety Checklist Items Safety Checklist Consent Form Signed Patient voided / foley secured and emptied When did you last eato 0710 Last dose of injectable or oral agent 0658 Ostomy pouch emptied and vented if applicable NA All implantable devices assessed, documented and approved NA Intravenous access site secured and place NA Valuables secured Linens and cotton and cotton/polyester blend (less than 51% polyester) Personal oil-based products / skin lotions / body lotions removed Wigs or hairpieces removed NA Smoking or tobacco materials removed Books / newspapers / magazines / loose paper removed Cologne, aftershave, perfume and deodorant removed Jewelry removed (may wrap wedding band) Make-up removed NA Hair care products removed Battery operated devices (external) removed Heating patches and chemical warmers  removed Titanium eyewear removed NA Nail polish cured greater than 10 hours NA Casting material cured greater than 10 hours NA Hearing aids removed NA Loose dentures or partials removed NA Prosthetics have been removed NA Patient demonstrates correct use of air break device (if applicable) Patient concerns have been addressed Patient grounding bracelet on and cord attached to chamber  Specifics for Inpatients (complete in addition to above) Medication sheet sent with patient NA Intravenous medications needed or due during therapy sent with patient NA Drainage tubes (e.g. nasogastric tube or chest tube secured and vented) NA Endotracheal or Tracheotomy tube secured NA Cuff deflated of air and inflated with saline NA Airway suctioned NA Notes The safety checklist was done before the treatment was started. Electronic Signature(s) Signed: 11/13/2021 1:46:20 PM By: [name] EMT Entered By: [name] on 11/13/2021 13:46:20

## 2021-11-13 NOTE — Progress Notes (Signed)
[name], [name] ([id number]) [id number] Page 1 of 2 Visit Report for 11/13/2021 Problem List Details Patient Name: Date of Service: [name], [name] 11/13/2021 8:00 [name] Medical Record Number: [id number] Patient Account Number: [id number] Date of Birth/Sex: Treating RN: [birth year] 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 11/13/2021 Medical Record Number: [id number] Patient Account Number: [id number] Date of Birth/Sex: Treating RN: 02/23/1970 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 D82.641 Other chronic osteomyelitis, right ankle and foot L97.514 Non-pressure chronic ulcer of other part of right foot with necrosis of bone [name], [name] ([id number]) [id number] Page 2 of  2 Facility Procedures : CPT4 Code Description: 80881103 G0277-(Facility Use Only) HBOT full body chamber, 20mn , ICD-10 Diagnosis Description E11.49 Type 2 diabetes mellitus with other diabetic neurolog L97.514 Non-pressure chronic ulcer of other part of right foo M86.671  Other chronic osteomyelitis, right ankle and foot Modifier: ical complication t with necrosis o Quantity: 4 f bone Physician Procedures : CPT4 Code Description Modifier [id number] 92924- WC PHYS HYPERBARIC OXYGEN THERAPY ICD-10 Diagnosis Description 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

## 2021-11-13 NOTE — Progress Notes (Signed)
[name], Dio Z ([id number]) [id number] Page 1 of 2 Visit Report for 11/13/2021 Arrival Information Details Patient Name: Date of Service: [name], [name] [name] 11/13/2021 8:00 A M Medical Record Number: [id number] Patient Account Number: [id number] Date of Birth/Sex: Treating RN: [birth year] [name], [name] Primary Care Khloei Spiker: [name] Other Clinician: [name] Referring Ancelmo Hunt: Treating Raesean Bartoletti/Extender: [name]8 Visit Information History Since Last Visit All ordered tests and consults were completed: Yes Patient Arrived: Ambulatory Added or deleted any medications: No Arrival Time: 07:48 Any new allergies or adverse reactions: No Accompanied By: None Had a fall or experienced change [name] No Transfer Assistance: None activities of daily living that may affect Patient Identification Verified: Yes risk of falls: Secondary Verification Process Completed: Yes Signs or symptoms of abuse/neglect since last visito No Patient Requires Transmission-Based Precautions: No Hospitalized since last visit: No Patient Has Alerts: Yes Implantable device outside of the clinic excluding No cellular tissue based products placed [name] the center since last visit: Pain Present Now: No Electronic Signature(s) Signed: 11/13/2021 1:43:53 PM By: [name] EMT Entered By: [name] on 11/13/2021 13:43:52 -------------------------------------------------------------------------------- Encounter Discharge Information Details Patient Name: Date of Service: [name], [name] 11/13/2021 8:00 A M Medical Record Number: [id number] Patient Account Number: [id number] Date of Birth/Sex: Treating RN: 1970-01-25 [name] Primary Care Tniyah Nakagawa: [name] Other Clinician: [name] Referring Torrence Hammack: Treating Alcario Tinkey/Extender: [name]  Treatment: 18 Encounter Discharge Information Items Discharge Condition: Stable Ambulatory Status: Ambulatory Discharge Destination: Home Transportation: Private Auto Accompanied By: None Schedule Follow-up Appointment: Yes Clinical Summary of Care: Electronic Signature(s) Signed: 11/13/2021 1:48:53 PM By: [name] EMT Entered By: [name] on 11/13/2021 13:48:52 [name], [name] ([id number]) [id number] Page 2 of 2 -------------------------------------------------------------------------------- Vitals Details Patient Name: Date of Service: [name], [name] 11/13/2021 8:00 A M Medical Record Number: [id number] Patient Account Number: [id number] Date of Birth/Sex: Treating RN: 27-May-1970 [name] Primary Care Lakeva Hollon: [name] Other Clinician: [name] Referring Jatavis Malek: Treating Donnielle Addison/Extender: [name]8 Vital Signs Time Taken: 08:10 Temperature (F): 98.2 Height ([name]: 72 Pulse (bpm): 83 Weight (lbs): 312 Respiratory Rate (breaths/min): 20 Body Mass Index (BMI): 42.3 Blood Pressure (mmHg): 137/80 Capillary Blood Glucose (mg/dl): 154 Reference Range: 80 - 120 mg / dl Electronic Signature(s) Signed: 11/13/2021 1:44:17 PM By: [name] EMT Entered By: [name] on 11/13/2021 13:44:17

## 2021-11-14 ENCOUNTER — Encounter (HOSPITAL_BASED_OUTPATIENT_CLINIC_OR_DEPARTMENT_OTHER): Payer: BLUE CROSS/BLUE SHIELD | Admitting: General Surgery

## 2021-11-14 ENCOUNTER — Ambulatory Visit: Payer: BLUE CROSS/BLUE SHIELD | Admitting: Family Medicine

## 2021-11-14 DIAGNOSIS — E1169 Type 2 diabetes mellitus with other specified complication: Secondary | ICD-10-CM | POA: Diagnosis not present

## 2021-11-14 LAB — GLUCOSE, CAPILLARY
Glucose-Capillary: 170 mg/dL — ABNORMAL HIGH (ref 70–99)
Glucose-Capillary: 131 mg/dL — ABNORMAL HIGH (ref 70–99)

## 2021-11-14 NOTE — Progress Notes (Addendum)
[name], [name] ([id number]) [id number] Page 1 of 2 Visit Report for 11/14/2021 HBO Details Patient Name: Date of Service: [name], [name] 11/14/2021 8:00 A M Medical Record Number: [id number] Patient Account Number: [id number] Date of Birth/Sex: Treating RN: December 24, 1970 (51 y.o. [name] Primary Care Fleeta Kunde: [name] Other Clinician: [name] Referring Ronell Boldin: Treating Danaiya Steadman/Extender: [name] in Treatment: 53 HBO Treatment Course Details Treatment Course Number: 1 Ordering Shahrukh Pasch: [name] T Treatments Ordered: otal 80 HBO Treatment Start Date: 08/14/2021 HBO Indication: Diabetic Ulcer(s) of the Lower Extremity Standard/Conservative Wound Care tried and failed greater than or equal to 30 days HBO Treatment Details Treatment Number: 64 Patient Type: Outpatient Chamber Type: Monoplace Chamber Serial #: [id number] Treatment Protocol: 2.0 ATA with 90 minutes oxygen, and no air breaks Treatment Details Compression Rate Down: 2.0 psi / minute De-Compression Rate Up: 1.0 psi / minute Air breaks and breathing Decompress Decompress Compress Tx Pressure Begins Reached periods Begins Ends (leave unused spaces blank) Chamber Pressure (ATA 1 2 ------2 1 ) Clock Time (24 hr) 08:12 08:20 - - - - - - 09:50 09:58 Treatment Length: 106 (minutes) Treatment Segments: 4 Vital Signs Capillary Blood Glucose Reference Range: 80 - 120 mg / dl HBO Diabetic Blood Glucose Intervention Range: <131 mg/dl or >249 mg/dl Time Vitals Blood Respiratory Capillary Blood Glucose Pulse Action Type: Pulse: Temperature: Taken: Pressure: Rate: Glucose (mg/dl): Meter #: Oximetry (%) Taken: Pre 08:09 139/77 85 16 98.6 170 Post 10:05 147/91 70 18 98 131 Treatment Response Treatment Toleration: Well Treatment Completion Status: Treatment Completed without Adverse Event Additional Procedure  Documentation Tissue Sevierity: Necrosis of bone Physician HBO Attestation: I certify that I supervised this HBO treatment in accordance with Medicare guidelines. A trained emergency response team is readily available per Yes hospital policies and procedures. Continue HBOT as ordered. Yes Electronic Signature(s) Signed: 11/15/2021 8:01:36 AM By: [name] MD FACS Previous Signature: 11/14/2021 1:42:51 PM Version By: [name] EMT Entered By: [name] on 11/15/2021 08:01:36 [name], [name] ([id number]) [id number] Page 2 of 2 -------------------------------------------------------------------------------- HBO Safety Checklist Details Patient Name: Date of Service: [name], [name] 11/14/2021 8:00 A M Medical Record Number: [id number] Patient Account Number: [id number] Date of Birth/Sex: Treating RN: Mar 21, 1970 (51 y.o. [name] Primary Care Jorryn Hershberger: [name] Other Clinician: [name] Referring Andreah Goheen: Treating Cionna Collantes/Extender: [name] in Treatment: 19 HBO Safety Checklist Items Safety Checklist Consent Form Signed Patient voided / foley secured and emptied When did you last eato 0740 Last dose of injectable or oral agent 0700 Ostomy pouch emptied and vented if applicable NA All implantable devices assessed, documented and approved NA Intravenous access site secured and place NA Valuables secured Linens and cotton and cotton/polyester blend (less than 51% polyester) Personal oil-based products / skin lotions / body lotions removed Wigs or hairpieces removed NA Smoking or tobacco materials removed Books / newspapers / magazines / loose paper removed Cologne, aftershave, perfume and deodorant removed Jewelry removed (may wrap wedding band) Make-up removed NA Hair care products removed Battery operated devices (external) removed Heating patches and chemical warmers  removed Titanium eyewear removed NA Nail polish cured greater than 10 hours NA Casting material cured greater than 10 hours NA Hearing aids removed NA Loose dentures or partials removed NA Prosthetics have been removed NA Patient demonstrates correct use of air break device (if applicable) Patient concerns have been addressed Patient grounding bracelet on and cord attached to chamber  Specifics for Inpatients (complete in addition to above) Medication sheet sent with patient NA Intravenous medications needed or due during therapy sent with patient NA Drainage tubes (e.g. nasogastric tube or chest tube secured and vented) NA Endotracheal or Tracheotomy tube secured NA Cuff deflated of air and inflated with saline NA Airway suctioned NA Notes The safety checklist was done before the treatment was started. Electronic Signature(s) Signed: 11/14/2021 1:36:11 PM By: [name] EMT Entered By: [name] on 11/14/2021 13:36:11

## 2021-11-14 NOTE — Progress Notes (Signed)
[name], Caz Penasco ([id number]) [id number] Page 1 of 2 Visit Report for 11/14/2021 Arrival Information Details Patient Name: Date of Service: [place], [place] [name] 11/14/2021 8:00 A M Medical Record Number: [id number] Patient Account Number: [id number] Date of Birth/Sex: Treating RN: July 18, 1970 (51 y.o. [name] Primary Care Ciera Beckum: [name] Other Clinician: [name] Referring Caleigha Zale: Treating Odyn Turko/Extender: [name] in Treatment: 19 Visit Information History Since Last Visit All ordered tests and consults were completed: Yes Patient Arrived: Ambulatory Added or deleted any medications: No Arrival Time: 07:45 Any new allergies or adverse reactions: No Accompanied By: None Had a fall or experienced change in No Transfer Assistance: None activities of daily living that may affect Patient Identification Verified: Yes risk of falls: Secondary Verification Process Completed: Yes Signs or symptoms of abuse/neglect since last visito No Patient Requires Transmission-Based Precautions: No Hospitalized since last visit: No Patient Has Alerts: Yes Implantable device outside of the clinic excluding No cellular tissue based products placed in the center since last visit: Pain Present Now: No Electronic Signature(s) Signed: 11/14/2021 1:34:40 PM By: [name] EMT Entered By: [name] on 11/14/2021 13:34:40 -------------------------------------------------------------------------------- Encounter Discharge Information Details Patient Name: Date of Service: [place], [name] 11/14/2021 8:00 A M Medical Record Number: [id number] Patient Account Number: [id number] Date of Birth/Sex: Treating RN: Jun 06, 1970 (51 y.o. [name] Primary Care Janit Cutter: [name] Other Clinician: [name] Referring Jaynell Castagnola: Treating Yilia Sacca/Extender: [name] in  Treatment: 19 Encounter Discharge Information Items Discharge Condition: Stable Ambulatory Status: Ambulatory Discharge Destination: Home Transportation: Private Auto Accompanied By: None Schedule Follow-up Appointment: Yes Clinical Summary of Care: Electronic Signature(s) Signed: 11/14/2021 1:43:52 PM By: [name] EMT Entered By: [name] on 11/14/2021 13:43:51 [name], [name] ([id number]) [id number] Page 2 of 2 -------------------------------------------------------------------------------- Vitals Details Patient Name: Date of Service: [name], [name] 11/14/2021 8:00 A M Medical Record Number: [id number] Patient Account Number: [id number] Date of Birth/Sex: Treating RN: [birth year] (51 y.o. [name] Primary Care Angele Wiemann: [name] Other Clinician: [name] Referring Rivky Clendenning: Treating Amye Grego/Extender: [name] in Treatment: 19 Vital Signs Time Taken: 08:09 Temperature (F): 98.6 Height (in): 72 Pulse (bpm): 85 Weight (lbs): 312 Respiratory Rate (breaths/min): 16 Body Mass Index (BMI): 42.3 Blood Pressure (mmHg): 139/77 Capillary Blood Glucose (mg/dl): 170 Reference Range: 80 - 120 mg / dl Electronic Signature(s) Signed: 11/14/2021 1:35:10 PM By: [name] EMT Entered By: [name] on 11/14/2021 13:35:10

## 2021-11-15 ENCOUNTER — Encounter (HOSPITAL_BASED_OUTPATIENT_CLINIC_OR_DEPARTMENT_OTHER): Payer: BLUE CROSS/BLUE SHIELD | Admitting: Internal Medicine

## 2021-11-15 DIAGNOSIS — L97514 Non-pressure chronic ulcer of other part of right foot with necrosis of bone: Secondary | ICD-10-CM | POA: Diagnosis not present

## 2021-11-15 DIAGNOSIS — M86671 Other chronic osteomyelitis, right ankle and foot: Secondary | ICD-10-CM

## 2021-11-15 DIAGNOSIS — E1149 Type 2 diabetes mellitus with other diabetic neurological complication: Secondary | ICD-10-CM

## 2021-11-15 DIAGNOSIS — E1169 Type 2 diabetes mellitus with other specified complication: Secondary | ICD-10-CM | POA: Diagnosis not present

## 2021-11-15 LAB — GLUCOSE, CAPILLARY
Glucose-Capillary: 152 mg/dL — ABNORMAL HIGH (ref 70–99)
Glucose-Capillary: 133 mg/dL — ABNORMAL HIGH (ref 70–99)

## 2021-11-15 NOTE — Progress Notes (Signed)
[name], [name] ([id number]) [id number] Page 1 of 2 Visit Report for 11/15/2021 Arrival Information Details Patient Name: Date of Service: [name], [name] 11/15/2021 8:00 [name] Medical Record Number: [id number] Patient Account Number: [id number] Date of Birth/Sex: Treating RN: 12/06/1970 [name], [name] Primary Care [name]: [name] Other Clinician: Valeria [name] Referring [name]: Treating [name]/Extender: [name] in Treatment: 98 Visit Information History Since Last Visit All ordered tests and consults were completed: Yes Patient Arrived: Ambulatory Added or deleted any medications: No Arrival Time: 07:47 Any new allergies or adverse reactions: No Accompanied By: None Had a fall or experienced change in No Transfer Assistance: None activities of daily living that may affect Patient Identification Verified: Yes risk of falls: Secondary Verification Process Completed: Yes Signs or symptoms of abuse/neglect since last visito No Patient Requires Transmission-Based Precautions: No Hospitalized since last visit: No Patient Has Alerts: Yes Implantable device outside of the clinic excluding No cellular tissue based products placed in the center since last visit: Pain Present Now: No Electronic Signature(s) Signed: 11/15/2021 1:36:16 PM By: [name] EMT Entered By: [name] on 11/15/2021 13:36:15 -------------------------------------------------------------------------------- Encounter Discharge Information Details Patient Name: Date of Service: [name], [name] 11/15/2021 8:00 [name] Medical Record Number: [id number] Patient Account Number: [id number] Date of Birth/Sex: Treating RN: 1970-06-13 [name], [name] Primary Care [name]: [name] Other Clinician: Valeria [name] Referring [name]: Treating Kalmen Lollar/Extender: [name] in  Treatment: 19 Encounter Discharge Information Items Discharge Condition: Stable Ambulatory Status: Ambulatory Discharge Destination: Home Transportation: Private Auto Accompanied By: None Schedule Follow-up Appointment: Yes Clinical Summary of Care: Electronic Signature(s) Signed: 11/15/2021 1:46:39 PM By: [name] EMT Entered By: [name] on 11/15/2021 13:46:39 [name], [name] ([id number]) [id number] Page 2 of 2 -------------------------------------------------------------------------------- Vitals Details Patient Name: Date of Service: [name], [name] 11/15/2021 8:00 [name] Medical Record Number: [id number] Patient Account Number: [id number] Date of Birth/Sex: Treating RN: 06-May-1970 [name], [name] Primary Care [name]: [name] Other Clinician: Valeria [name] Referring [name]: Treating [name]/Extender: [name] in Treatment: 19 Vital Signs Time Taken: 08:00 Temperature (F): 98.0 Height (in): 72 Pulse (bpm): 85 Weight (lbs): 312 Respiratory Rate (breaths/min): 16 Body Mass Index (BMI): 42.3 Blood Pressure (mmHg): 136/85 Capillary Blood Glucose (mg/dl): 152 Reference Range: 80 - 120 mg / dl Electronic Signature(s) Signed: 11/15/2021 1:36:46 PM By: [name] EMT Entered By: [name] on 11/15/2021 13:36:46

## 2021-11-15 NOTE — Telephone Encounter (Signed)
Spoke with pt and gave pt results and recommendations. Pt verbalized understanding and had no other concerns at end of call.

## 2021-11-15 NOTE — Progress Notes (Signed)
[name], Atharva H ([id number]) [id number] Page 1 of 2 Visit Report for 11/14/2021 Problem List Details Patient Name: Date of Service: [name], [name] 11/14/2021 8:00 A M Medical Record Number: [id number] Patient Account Number: [id number] Date of Birth/Sex: Treating RN: 07-29-1970 (51 y.o. 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 11/14/2021 Medical Record Number: [id number] Patient Account Number: [id number] Date of Birth/Sex: Treating RN: September 10, 1970 (51 y.o. 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 Z66.294 Other chronic osteomyelitis, right ankle and foot L97.514 Non-pressure chronic ulcer of other part of right foot with necrosis of bone [name], Ferry M ([id number]) [id number]  Page 2 of 2 Facility Procedures : CPT4 Code Description: [id number] G0277-(Facility Use Only) HBOT full body chamber, 37mn , ICD-10 Diagnosis Description 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 L97.514 Non-pressure chronic ulcer of other part of right foo M86.671  Other chronic osteomyelitis, right ankle and foot Modifier: ical complication t with necrosis o Quantity: 4 f bone Physician Procedures : CPT4 Code Description Modifier [id number] 49449- WC PHYS HYPERBARIC OXYGEN THERAPY ICD-10 Diagnosis Description 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

## 2021-11-15 NOTE — Progress Notes (Addendum)
[name], Yidel [place] ([id number]) [id number] Page 1 of 2 Visit Report for 11/15/2021 HBO Details Patient Name: Date of Service: [name], [name] 11/15/2021 8:00 A M Medical Record Number: [id number] Patient Account Number: [id number] Date of Birth/Sex: Treating RN: 09-02-1970 (51 y.o. [name], [name] Primary Care Bricyn Labrada: [name] Other Clinician: [name] Referring Jowanna Loeffler: Treating Treyton Slimp/Extender: [name] in Treatment: 19 HBO Treatment Course Details Treatment Course Number: 1 Ordering Jodilyn Giese: [name] T Treatments Ordered: otal 80 HBO Treatment Start Date: 08/14/2021 HBO Indication: Diabetic Ulcer(s) of the Lower Extremity Standard/Conservative Wound Care tried and failed greater than or equal to 30 days HBO Treatment Details Treatment Number: 65 Patient Type: Outpatient Chamber Type: Monoplace Chamber Serial #: [id number] Treatment Protocol: 2.0 ATA with 90 minutes oxygen, and no air breaks Treatment Details Compression Rate Down: 2.0 psi / minute De-Compression Rate Up: 2.0 psi / minute Air breaks and breathing Decompress Decompress Compress Tx Pressure Begins Reached periods Begins Ends (leave unused spaces blank) Chamber Pressure (ATA 1 2 ------2 1 ) Clock Time (24 hr) 08:05 08:13 - - - - - - 09:43 09:51 Treatment Length: 106 (minutes) Treatment Segments: 4 Vital Signs Capillary Blood Glucose Reference Range: 80 - 120 mg / dl HBO Diabetic Blood Glucose Intervention Range: <131 mg/dl or >249 mg/dl Time Vitals Blood Respiratory Capillary Blood Glucose Pulse Action Type: Pulse: Temperature: Taken: Pressure: Rate: Glucose (mg/dl): Meter #: Oximetry (%) Taken: Pre 08:00 136/85 85 16 98 152 Post 09:57 153/91 70 18 98.2 133 Treatment Response Treatment Toleration: Well Treatment Completion Status: Treatment Completed without Adverse Event Additional Procedure Documentation Tissue  Sevierity: Necrosis of bone Physician HBO Attestation: I certify that I supervised this HBO treatment in accordance with Medicare guidelines. A trained emergency response team is readily available per Yes hospital policies and procedures. Continue HBOT as ordered. Yes Electronic Signature(s) Signed: 11/15/2021 4:15:15 PM By: [name] DO Previous Signature: 11/15/2021 1:45:30 PM Version By: [name] EMT Entered By: [name] on 11/15/2021 16:13:04 [name], [name] ([id number]) [id number] Page 2 of 2 -------------------------------------------------------------------------------- HBO Safety Checklist Details Patient Name: Date of Service: [name], [name] 11/15/2021 8:00 A M Medical Record Number: [id number] Patient Account Number: [id number] Date of Birth/Sex: Treating RN: April 17, 1970 (51 y.o. [name], [name] Primary Care Dwan Hemmelgarn: [name] Other Clinician: [name] Referring Ileen Kahre: Treating Suda Forbess/Extender: [name] in Treatment: 19 HBO Safety Checklist Items Safety Checklist Consent Form Signed Patient voided / foley secured and emptied When did you last eato 0710 Last dose of injectable or oral agent 0710 Ostomy pouch emptied and vented if applicable NA All implantable devices assessed, documented and approved NA Intravenous access site secured and place NA Valuables secured Linens and cotton and cotton/polyester blend (less than 51% polyester) Personal oil-based products / skin lotions / body lotions removed Wigs or hairpieces removed NA Smoking or tobacco materials removed Books / newspapers / magazines / loose paper removed Cologne, aftershave, perfume and deodorant removed Jewelry removed (may wrap wedding band) Make-up removed NA Hair care products removed Battery operated devices (external) removed Heating patches and chemical warmers removed Titanium eyewear  removed NA Nail polish cured greater than 10 hours NA Casting material cured greater than 10 hours NA Hearing aids removed NA Loose dentures or partials removed NA Prosthetics have been removed NA Patient demonstrates correct use of air break device (if applicable) Patient concerns have been addressed Patient grounding bracelet on and cord attached to chamber Specifics  for Inpatients (complete in addition to above) Medication sheet sent with patient NA Intravenous medications needed or due during therapy sent with patient NA Drainage tubes (e.g. nasogastric tube or chest tube secured and vented) NA Endotracheal or Tracheotomy tube secured NA Cuff deflated of air and inflated with saline NA Airway suctioned NA Notes The safety checklist was done before the treatment was started. Electronic Signature(s) Signed: 11/15/2021 1:44:11 PM By: [name] EMT Entered By: [name] on 11/15/2021 13:44:11

## 2021-11-15 NOTE — Progress Notes (Signed)
[name], [name] ([id number]) [id number] Page 1 of 2 Visit Report for 11/15/2021 Problem List Details Patient Name: Date of Service: [name], [name] 11/15/2021 8:00 A M Medical Record Number: [id number] Patient Account Number: [id number] Date of Birth/Sex: Treating RN: 12/15/1970 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 11/15/2021 Medical Record Number: [id number] Patient Account Number: [id number] Date of Birth/Sex: Treating RN: [birth year] 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 Q49.201 Other chronic osteomyelitis, right ankle and foot L97.514 Non-pressure chronic ulcer of other part of right foot with necrosis of bone [name], [name] ([id number]) [id number] Page 2 of  2 Facility Procedures : CPT4 Code Description: [id number] G0277-(Facility Use Only) HBOT full body chamber, 38mn , ICD-10 Diagnosis Description 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 L97.514 Non-pressure chronic ulcer of other part of right foo M86.671  Other chronic osteomyelitis, right ankle and foot Modifier: ical complication t with necrosis o Quantity: 4 f bone Physician Procedures : CPT4 Code Description Modifier [id number] 09407- WC PHYS HYPERBARIC OXYGEN THERAPY ICD-10 Diagnosis Description 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

## 2021-11-18 ENCOUNTER — Encounter (HOSPITAL_BASED_OUTPATIENT_CLINIC_OR_DEPARTMENT_OTHER): Payer: BLUE CROSS/BLUE SHIELD | Admitting: Internal Medicine

## 2021-11-18 DIAGNOSIS — E1149 Type 2 diabetes mellitus with other diabetic neurological complication: Secondary | ICD-10-CM

## 2021-11-18 DIAGNOSIS — M86671 Other chronic osteomyelitis, right ankle and foot: Secondary | ICD-10-CM

## 2021-11-18 DIAGNOSIS — L97514 Non-pressure chronic ulcer of other part of right foot with necrosis of bone: Secondary | ICD-10-CM | POA: Diagnosis not present

## 2021-11-18 DIAGNOSIS — E1169 Type 2 diabetes mellitus with other specified complication: Secondary | ICD-10-CM | POA: Diagnosis not present

## 2021-11-18 LAB — GLUCOSE, CAPILLARY
Glucose-Capillary: 190 mg/dL — ABNORMAL HIGH (ref 70–99)
Glucose-Capillary: 183 mg/dL — ABNORMAL HIGH (ref 70–99)

## 2021-11-18 NOTE — Progress Notes (Addendum)
[name], [name] ([id number]) [id number] Page 1 of 2 Visit Report for 11/18/2021 HBO Details Patient Name: Date of Service: [name], [name] 11/18/2021 8:00 [name] Medical Record Number: [id number] Patient Account Number: [id number] Date of Birth/Sex: Treating RN: 1970/09/08 [name] Primary Care Shereka Lafortune: [name] Other Clinician: [name] Referring Lanika Colgate: Treating Jahn Franchini/Extender: [name] in Treatment: 24 HBO Treatment Course Details Treatment Course Number: 1 Ordering Arraya Buck: [name] T Treatments Ordered: otal 80 HBO Treatment Start Date: 08/14/2021 HBO Indication: Diabetic Ulcer(s) of the Lower Extremity Standard/Conservative Wound Care tried and failed greater than or equal to 30 days HBO Treatment Details Treatment Number: 66 Patient Type: Outpatient Chamber Type: Monoplace Chamber Serial #: [id number] Treatment Protocol: 2.0 ATA with 90 minutes oxygen, and no air breaks Treatment Details Compression Rate Down: 2.0 psi / minute De-Compression Rate Up: 2.0 psi / minute Air breaks and breathing Decompress Decompress Compress Tx Pressure Begins Reached periods Begins Ends (leave unused spaces blank) Chamber Pressure (ATA 1 2 ------2 1 ) Clock Time (24 hr) 08:17 08:26 - - - - - - 09:56 10:05 Treatment Length: 108 (minutes) Treatment Segments: 4 Vital Signs Capillary Blood Glucose Reference Range: 80 - 120 mg / dl HBO Diabetic Blood Glucose Intervention Range: <131 mg/dl or >249 mg/dl Time Vitals Blood Respiratory Capillary Blood Glucose Pulse Action Type: Pulse: Temperature: Taken: Pressure: Rate: Glucose (mg/dl): Meter #: Oximetry (%) Taken: Pre 08:12 129/87 83 16 190 Post 10:11 56/86 77 18 97.5 183 Treatment Response Treatment Toleration: Well Treatment Completion Status: Treatment Completed without Adverse Event Additional Procedure Documentation Tissue  Sevierity: Necrosis of bone Physician HBO Attestation: I certify that I supervised this HBO treatment in accordance with Medicare guidelines. A trained emergency response team is readily available per Yes hospital policies and procedures. Continue HBOT as ordered. Yes Electronic Signature(s) Signed: 11/22/2021 1:40:10 PM By: [name] DO Previous Signature: 11/18/2021 1:20:46 PM Version By: [name] EMT Entered By: [name] on 11/19/2021 14:45:32 [name], [name] ([id number]) [id number] Page 2 of 2 -------------------------------------------------------------------------------- HBO Safety Checklist Details Patient Name: Date of Service: [name], [name] 11/18/2021 8:00 [name] Medical Record Number: [id number] Patient Account Number: [id number] Date of Birth/Sex: Treating RN: 1970-06-11 [name] Primary Care Koltin Wehmeyer: [name] Other Clinician: [name] Referring Marki Frede: Treating Riggins Cisek/Extender: [name] in Treatment: 19 HBO Safety Checklist Items Safety Checklist Consent Form Signed Patient voided / foley secured and emptied When did you last eato 0650 Last dose of injectable or oral agent 0650 Ostomy pouch emptied and vented if applicable NA All implantable devices assessed, documented and approved NA Intravenous access site secured and place NA Valuables secured Linens and cotton and cotton/polyester blend (less than 51% polyester) Personal oil-based products / skin lotions / body lotions removed Wigs or hairpieces removed NA Smoking or tobacco materials removed Books / newspapers / magazines / loose paper removed Cologne, aftershave, perfume and deodorant removed Jewelry removed (may wrap wedding band) Make-up removed NA Hair care products removed Battery operated devices (external) removed Heating patches and chemical warmers removed Titanium eyewear  removed NA Nail polish cured greater than 10 hours NA Casting material cured greater than 10 hours NA Hearing aids removed NA Loose dentures or partials removed NA Prosthetics have been removed NA Patient demonstrates correct use of air break device (if applicable) Patient concerns have been addressed Patient grounding bracelet on and cord attached to chamber Specifics for  Inpatients (complete in addition to above) Medication sheet sent with patient NA Intravenous medications needed or due during therapy sent with patient NA Drainage tubes (e.g. nasogastric tube or chest tube secured and vented) NA Endotracheal or Tracheotomy tube secured NA Cuff deflated of air and inflated with saline NA Airway suctioned NA Notes The safety checklist was done before the treatment was started. Electronic Signature(s) Signed: 11/18/2021 1:18:29 PM By: [name] EMT Entered By: [name] on 11/18/2021 13:18:29

## 2021-11-18 NOTE — Progress Notes (Signed)
[name], Francisco P ([id number]) [id number] Page 1 of 2 Visit Report for 11/18/2021 Arrival Information Details Patient Name: Date of Service: [place], [place] [name] 11/18/2021 8:00 A M Medical Record Number: [id number] Patient Account Number: [id number] Date of Birth/Sex: Treating RN: [birth year] (51 y.o. Flossie Buffy, Lovena Le Primary Care Abayomi Pattison: [name] Other Clinician: [name] Referring Tenee Wish: Treating Mayara Paulson/Extender: [name] in Treatment: 37 Visit Information History Since Last Visit All ordered tests and consults were completed: Yes Patient Arrived: Ambulatory Added or deleted any medications: No Arrival Time: 07:47 Any new allergies or adverse reactions: No Accompanied By: None Had a fall or experienced change in No Transfer Assistance: None activities of daily living that may affect Patient Identification Verified: Yes risk of falls: Secondary Verification Process Completed: Yes Signs or symptoms of abuse/neglect since last visito No Patient Requires Transmission-Based Precautions: No Hospitalized since last visit: No Patient Has Alerts: Yes Implantable device outside of the clinic excluding No cellular tissue based products placed in the center since last visit: Pain Present Now: No Electronic Signature(s) Signed: 11/18/2021 1:16:56 PM By: [name] EMT Entered By: [name] on 11/18/2021 13:16:56 -------------------------------------------------------------------------------- Encounter Discharge Information Details Patient Name: Date of Service: [name], [name] 11/18/2021 8:00 A M Medical Record Number: [id number] Patient Account Number: [id number] Date of Birth/Sex: Treating RN: 11-15-1970 (52 y.o. [name] Primary Care Cyler Kappes: [name] Other Clinician: [name] Referring Cordai Rodrigue: Treating Ruben Mahler/Extender: [name] in  Treatment: 19 Encounter Discharge Information Items Discharge Condition: Stable Ambulatory Status: Ambulatory Discharge Destination: Home Transportation: Private Auto Accompanied By: None Schedule Follow-up Appointment: Yes Clinical Summary of Care: Electronic Signature(s) Signed: 11/18/2021 1:21:57 PM By: [name] EMT Entered By: [name] on 11/18/2021 13:21:57 [name], Cartrell M ([id number]) [id number] Page 2 of 2 -------------------------------------------------------------------------------- Vitals Details Patient Name: Date of Service: [name], [name] 11/18/2021 8:00 A M Medical Record Number: [id number] Patient Account Number: [id number] Date of Birth/Sex: Treating RN: 11/06/1970 (51 y.o. [name] Primary Care Tristen Luce: [name] Other Clinician: [name] Referring Braylynn Lewing: Treating Glendine Swetz/Extender: [name] in Treatment: 19 Vital Signs Time Taken: 08:12 Temperature (F): 129/87 Height (in): 72 Pulse (bpm): 83 Weight (lbs): 312 Respiratory Rate (breaths/min): 16 Body Mass Index (BMI): 42.3 Blood Pressure (mmHg): 129/87 Capillary Blood Glucose (mg/dl): 190 Reference Range: 80 - 120 mg / dl Electronic Signature(s) Signed: 11/18/2021 1:17:24 PM By: [name] EMT Entered By: [name] on 11/18/2021 13:17:24

## 2021-11-19 ENCOUNTER — Encounter (HOSPITAL_BASED_OUTPATIENT_CLINIC_OR_DEPARTMENT_OTHER): Payer: BLUE CROSS/BLUE SHIELD | Admitting: Internal Medicine

## 2021-11-19 ENCOUNTER — Encounter (HOSPITAL_BASED_OUTPATIENT_CLINIC_OR_DEPARTMENT_OTHER): Payer: BLUE CROSS/BLUE SHIELD | Admitting: General Surgery

## 2021-11-19 DIAGNOSIS — E1149 Type 2 diabetes mellitus with other diabetic neurological complication: Secondary | ICD-10-CM | POA: Diagnosis not present

## 2021-11-19 DIAGNOSIS — M86671 Other chronic osteomyelitis, right ankle and foot: Secondary | ICD-10-CM | POA: Diagnosis not present

## 2021-11-19 DIAGNOSIS — E1169 Type 2 diabetes mellitus with other specified complication: Secondary | ICD-10-CM | POA: Diagnosis not present

## 2021-11-19 DIAGNOSIS — L97514 Non-pressure chronic ulcer of other part of right foot with necrosis of bone: Secondary | ICD-10-CM

## 2021-11-19 LAB — GLUCOSE, CAPILLARY: Glucose-Capillary: 160 mg/dL — ABNORMAL HIGH (ref 70–99)

## 2021-11-19 NOTE — Progress Notes (Addendum)
[name], [name] ([id number]) [id number] Page 1 of 2 Visit Report for 11/19/2021 HBO Details Patient Name: Date of Service: [name], [name] 11/19/2021 8:30 A M Medical Record Number: [id number] Patient Account Number: [id number] Date of Birth/Sex: Treating RN: [birth year] [name], [name] Primary Care [name]: [name] Other Clinician: Valeria [name] Referring [name]: Treating [name]/Extender: [name] in Treatment: 19 HBO Treatment Course Details Treatment Course Number: 1 Ordering [name]: [name] T Treatments Ordered: otal 80 HBO Treatment Start Date: 08/14/2021 HBO Indication: Diabetic Ulcer(s) of the Lower Extremity Standard/Conservative Wound Care tried and failed greater than or equal to 30 days HBO Treatment Details Treatment Number: 67 Patient Type: Outpatient Chamber Type: Monoplace Chamber Serial #: [id number] Treatment Protocol: 2.0 ATA with 90 minutes oxygen, and no air breaks Treatment Details Compression Rate Down: 2.0 psi / minute De-Compression Rate Up: 2.0 psi / minute Air breaks and breathing Decompress Decompress Compress Tx Pressure Begins Reached periods Begins Ends (leave unused spaces blank) Chamber Pressure (ATA 1 2 ------2 1 ) Clock Time (24 hr) 08:34 08:42 - - - - - - 10:12 10:20 Treatment Length: 106 (minutes) Treatment Segments: 4 Vital Signs Capillary Blood Glucose Reference Range: 80 - 120 mg / dl HBO Diabetic Blood Glucose Intervention Range: <131 mg/dl or >249 mg/dl Time Vitals Blood Respiratory Capillary Blood Glucose Pulse Action Type: Pulse: Temperature: Taken: Pressure: Rate: Glucose (mg/dl): Meter #: Oximetry (%) Taken: Pre 08:30 109/81 90 18 98.4 160 Post 10:27 125/84 71 16 97.3 139 Treatment Response Treatment Toleration: Well Treatment Completion Status: Treatment Completed without Adverse Event Additional Procedure Documentation Tissue  Sevierity: Necrosis of bone Physician HBO Attestation: I certify that I supervised this HBO treatment in accordance with Medicare guidelines. A trained emergency response team is readily available per Yes hospital policies and procedures. Continue HBOT as ordered. Yes Electronic Signature(s) Signed: 11/22/2021 1:40:10 PM By: [name] Shan DO Previous Signature: 11/19/2021 3:33:14 PM Version By: [name] EMT Entered By: [name] on 11/19/2021 16:35:46 [name], [name] ([id number]) [id number] Page 2 of 2 -------------------------------------------------------------------------------- HBO Safety Checklist Details Patient Name: Date of Service: [name], [name] 11/19/2021 8:30 A M Medical Record Number: [id number] Patient Account Number: [id number] Date of Birth/Sex: Treating RN: Aug 07, 1970 [name], [name] Primary Care [name]: [name] Other Clinician: Valeria [name] Referring [name]: Treating [name]/Extender: [name] in Treatment: 19 HBO Safety Checklist Items Safety Checklist Consent Form Signed Patient voided / foley secured and emptied When did you last eato 0710 Last dose of injectable or oral agent 0700 Ostomy pouch emptied and vented if applicable NA All implantable devices assessed, documented and approved NA Intravenous access site secured and place NA Valuables secured Linens and cotton and cotton/polyester blend (less than 51% polyester) Personal oil-based products / skin lotions / body lotions removed Wigs or hairpieces removed NA Smoking or tobacco materials removed Books / newspapers / magazines / loose paper removed Cologne, aftershave, perfume and deodorant removed Jewelry removed (may wrap wedding band) Make-up removed NA Hair care products removed Battery operated devices (external) removed Heating patches and chemical warmers removed Titanium eyewear  removed NA Nail polish cured greater than 10 hours NA Casting material cured greater than 10 hours NA Hearing aids removed NA Loose dentures or partials removed NA Prosthetics have been removed NA Patient demonstrates correct use of air break device (if applicable) Patient concerns have been addressed Patient grounding bracelet on and cord attached to chamber Specifics  for Inpatients (complete in addition to above) Medication sheet sent with patient NA Intravenous medications needed or due during therapy sent with patient NA Drainage tubes (e.g. nasogastric tube or chest tube secured and vented) NA Endotracheal or Tracheotomy tube secured NA Cuff deflated of air and inflated with saline NA Airway suctioned NA Notes The safety checklist was done before the treatment was started. Electronic Signature(s) Signed: 11/19/2021 3:31:09 PM By: [name] EMT Entered By: [name] on 11/19/2021 15:31:09

## 2021-11-19 NOTE — Progress Notes (Signed)
[name], [name] ([id number]) [id number] Page 1 of 10 Visit Report for 11/19/2021 Chief Complaint Document Details Patient Name: Date of Service: [name], [place] [name] 11/19/2021 7:45 [name] Medical Record Number: [id number] Patient Account Number: [id number] Date of Birth/Sex: Treating RN: 30-Jul-1970 (51 y.o. [name] Primary Care Provider: [name] Other Clinician: Referring Provider: Treating Provider/Extender: [name] in Treatment: 19 Information Obtained from: Patient Chief Complaint Patients presents for treatment of an open diabetic ulcer Electronic Signature(s) Signed: 11/19/2021 8:05:37 AM By: [name] MD FACS Entered By: [name] on 11/19/2021 08:05:37 -------------------------------------------------------------------------------- Debridement Details Patient Name: Date of Service: [name], [name] 11/19/2021 7:45 [name] Medical Record Number: [id number] Patient Account Number: [id number] Date of Birth/Sex: Treating RN: 23-Dec-1970 (51 y.o. [name] Primary Care Provider: [name] Other Clinician: Referring Provider: Treating Provider/Extender: [name] in Treatment: 19 Debridement Performed for Assessment: Wound #2 [place] Performed By: Physician [name], MD Debridement Type: Debridement Severity of Tissue Pre Debridement: Fat layer exposed Level of Consciousness (Pre-procedure): Awake and Alert Pre-procedure Verification/Time Out Yes - 07:58 Taken: Start Time: 07:58 Pain Control: Lidocaine 4% T opical Solution T Area Debrided (L x W): otal 0.4 (cm) x 1 (cm) = 0.4 (cm) Tissue and other material debrided: Non-Viable, Callus Level: Non-Viable Tissue Debridement Description: Selective/Open Wound Instrument: Curette Bleeding: Minimum Hemostasis Achieved: Pressure Response to Treatment: Procedure was tolerated well Level  of Consciousness (Post- Awake and Alert procedure): Post Debridement Measurements of Total Wound Length: (cm) 0.4 Width: (cm) 1 Depth: (cm) 1.1 Volume: (cm) 0.346 Character of Wound/Ulcer Post Debridement: Improved Severity of Tissue Post Debridement: Fat layer exposed Post Procedure Diagnosis Same as Pre-procedure [name], [name] ([id number]) [id number] Page 2 of 10 Notes scribed for [name] by Adline Peals, RN Electronic Signature(s) Signed: 11/19/2021 11:15:04 AM By: [name] MD FACS Signed: 11/19/2021 4:24:58 PM By: Sabas Sous By: Adline Peals on 11/19/2021 08:01:10 -------------------------------------------------------------------------------- HPI Details Patient Name: Date of Service: [name], [name] 11/19/2021 7:45 [name] Medical Record Number: [id number] Patient Account Number: [id number] Date of Birth/Sex: Treating RN: [birth year] (51 y.o. 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 The most recent amputation was in August 2022. At that operation, antibiotic beads were placed in the wound. antibiotic beads were placed in the wound. He has been managed by podiatry for his procedures and management of his wounds. He has been in a Water engineer. He is on oral doxycycline. They have been using Betadine wet to dry dressings along with Iodosorb. The patient states that when he thinks the wound is getting too dry, he applies topical Neosporin. At his last visit, on June 7 of this year, the podiatrist determined that he felt the wound was stalled and referred him to wound care for additional  evaluation and management. An MRI has been ordered, but not yet scheduled or performed. Pathology from his operation in August 2022 demonstrated findings consistent with chronic osteomyelitis. Today, there is a large irregular wound on the plantar surface of his right foot, at about the level of the fifth metatarsal head. This tracks through to a pinpoint opening on the dorsal lateral portion of his foot. The intake  nurse reported purulent drainage. There is some malodor from the wound. No frank necrosis identified. 07/11/2021: Today, the wounds do not connect. I attempted multiple times from various directions and the shared tunnel is no longer open. He has some slough accumulation on the dorsal part of his foot as well as slough and callus buildup on the plantar surface. His MRI is scheduled for June 29. No purulent drainage or malodor appreciated today. 07/19/2021: The lateral foot wound has closed and there is no tunnel connecting the plantar foot wound to that site. The plantar foot wound still probes quite deeply, however. There is some slough, eschar, and nonviable tissue accumulated in the wound bed. No malodorous drainage present. His MRI was performed yesterday and is consistent with fairly extensive osteomyelitis. 07/29/2021: The patient has an appointment with infectious disease on July 18 to treat his osteomyelitis. The plantar wound still probes quite deeply, approaching bone. The orifice has narrowed quite substantially, however, making it more difficult for him to pack. 08/05/2021: The tunnel connecting the lateral foot wound to the plantar foot wound has reopened. He sees infectious disease tomorrow to discuss long-term antibiotic treatment for osteomyelitis. There was a bit of murky drainage in the wound, but this was noted after he had had topical lidocaine applied so may have just been a blob of the anesthetic. No odor or frank purulent drainage. The wound probes deeply at the midfoot  approaching bone. He does have MRI results confirming his diagnosis of osteomyelitis. He has failed to progress with conventional treatment and I think his best chance for preservation of the foot is to initiate hyperbaric oxygen therapy. 08/13/2021: The tunnel connecting the 2 wounds has closed again. He has a PICC line and is getting IV daptomycin and cefepime. EKG and chest x-ray are within normal limits. He is scheduled to start hyperbaric oxygen therapy tomorrow. The wound in his midfoot probes deeply, approaching bone. The skin at the orifice continues to heaped up and threatens to close over despite the large cavity within. No erythema, induration, or purulent drainage. The wound on his lateral foot is fairly small and quite clean. 08/19/2021: The lateral foot wound has nearly closed. The wound in his midfoot does not probe quite as deeply today. The skin at the orifice continues to try to roll in and obscure the opening. No frankly necrotic tissue appreciated. He is tolerating hyperbaric oxygen therapy. 08/26/2021: The lateral foot wound has closed completely. The wound in his midfoot is shallower again today. The wound orifice is contracting. We are using gentamicin and silver alginate. He continues on IV daptomycin and cefepime and is tolerating hyperbaric oxygen therapy without difficulty. 09/02/2021: His foot is a little bit sore today but he was up walking on it all weekend doing back to school shopping with his daughter. The tunneling is down to about 3 cm and I do not appreciate bone at the tip of the probe. The wound is clean but has some callus creating an overhanging lip at the distal aspect. He continues to receive hyperbaric oxygen therapy as well as IV daptomycin and cefepime. 09/09/2021: His wound continues to improve. The tunnel has come in by 0.9 cm. He continues to form callus around the orifice of the wound. He is tolerating hyperbaric oxygen therapy along with his IV  antibiotics. 09/16/2021: The tunneling is down to just half a centimeter. He continues to build up callus around the orifice of the wound. He completed his course of IV antibiotics and his  PICC line is scheduled to be removed this Friday. He is tolerating hyperbaric oxygen without difficulty. We have been applying topical gentamicin and silver alginate to his wound. 09/24/2021: The wound depth has come in even further. There is a little bit of callus buildup around the orifice. The opening is quite narrow, at this point. [name], [name] ([id number]) [id number] Page 3 of 10 09/30/2021: The wound depth is about the same this week. The opening continues to contract with callus accumulation. There is some discoloration of the skin on the lateral part of his foot and he admitted to walking more than usual this weekend and also wearing a different pair shoes. He continues to tolerate hyperbaric oxygen therapy without difficulty. 10/07/2021: The wound depth has contracted a bit and is now approximately 1 to 1.5 cm. The orifice of the wound continues to try and close in over the space. There is just some callus and skin around the margins obscuring the orifice. 10/14/2021: The wound depth has come in a little bit more. The orifice of the wound is rolling inwards with epithelium and callus, making it difficult to pack the wound. 10/21/2021: There is still 1 portion of the wound, at the lateral aspect, that is still a couple of centimeters deep. The architecture of the wound makes this somewhat challenging to access and pack. Everything else looks clean. He has his usual accumulation of callus and skin, narrowing the orifice. 10/29/2021: No significant change to the depth of the wound. The orifice continues to contract with callus and skin narrowing the opening. His wife has been packing the wound and doing an excellent job. 11/05/2021: The depth of the wound has come in by about a  centimeter. There is less callus accumulation around the orificedespite this, the wound is narrower today. We are still awaiting insurance approval for Kerecis powder. 11/12/2021: The depth of the wound continues to contract. He has accumulated some callus around the orifice, as usual. We have received a verbal confirmation that he is approved for Kerecis, but no formal paperwork has yet been received. 11/19/2021: The depth continues to come in. There is callus accumulation around the orifice, as usual. He has been formally approved for Kerecis, but unfortunately we do not have a billing mechanism for the powder in [facility] yet. We are working to address this so we can begin treatment. Electronic Signature(s) Signed: 11/19/2021 8:06:38 AM By: [name] MD FACS Entered By: [name] on 11/19/2021 08:06:38 -------------------------------------------------------------------------------- Physical Exam Details Patient Name: Date of Service: [name], [name] 11/19/2021 7:45 [name] Medical Record Number: [id number] Patient Account Number: [id number] Date of Birth/Sex: Treating RN: 08-12-1970 (51 y.o. [name] Primary Care Provider: [name] Other Clinician: Referring Provider: Treating Provider/Extender: [name] in Treatment: 19 Constitutional Slightly hypertensive. . . . No acute distress.Marland Kitchen Respiratory Normal work of breathing on room air.. Notes 11/19/2021: The depth continues to come in. There is callus accumulation around the orifice, as usual. Electronic Signature(s) Signed: 11/19/2021 8:07:06 AM By: [name] MD FACS Entered By: [name] on 11/19/2021 08:07:05 -------------------------------------------------------------------------------- Physician Orders Details Patient Name: Date of Service: [name], [name] 11/19/2021 7:45 [name] Medical Record Number: [id number] Patient Account Number: [id number] Date of Birth/Sex: Treating  RN: [birth year] (51 y.o. [name] Primary Care Provider: [name] Other Clinician: Referring Provider: Treating Provider/Extender: [name] in Treatment: [address] 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 - Dr Celine Ahr Room 2 Return A Anesthetic Wound #2 [place] (In clinic) Topical Lidocaine 4% applied to wound bed Cellular or Tissue Based Products Cellular or Tissue Based Product Type: - run insurance for a skin substitute - Kerecis powder Bathing/ Shower/ Hygiene Other Bathing/Shower/Hygiene Orders/Instructions: - Change dressing after bathing Off-Loading Other: - Try to not stand on your feet too much Hyperbaric Oxygen Therapy Evaluate for HBO Therapy Indication: - wagner grade 3 diabetic foot ulcer right foot 2.0 ATA for 90 Minutes without A Breaks ir Total Number of Treatments: - 40 more treatments for a total of 80 09/30/21 One treatments per day (delivered Monday through Friday unless otherwise specified in Special Instructions below): Finger stick Blood Glucose Pre- and Post- HBOT Treatment. Follow Hyperbaric Oxygen Glycemia Protocol Afrin (Oxymetazoline HCL) 0.05% nasal spray - 1 spray in both nostrils daily as needed prior to HBO treatment for difficulty clearing ears Wound Treatment Wound #2 - Foot Wound Laterality: Plantar, Right, Lateral Cleanser: Soap and Water Every Other Day/15 Days Discharge Instructions: May shower and wash wound with dial antibacterial soap and water prior to dressing change. Cleanser: Wound Cleanser (Generic)  Every Other Day/15 Days Discharge Instructions: Cleanse the wound with wound cleanser prior to applying a clean dressing using gauze sponges, not tissue or cotton balls. Peri-Wound Care: cotton tipped applicators (Generic) Every Other Day/15 Days Topical: Gentamicin Every Other Day/15 Days Discharge Instructions: As directed by physician Prim Dressing: Iodoform packing strip 1/4 (in) Every Other Day/15 Days ary Discharge Instructions: Lightly pack as instructed Secondary Dressing: Woven Gauze Sponge, Non-Sterile 4x4 in Every Other Day/15 Days Discharge Instructions: Apply over primary dressing as directed. Secured With: Paper Tape, 2x10 (in/yd) (Generic) Every Other Day/15 Days Discharge Instructions: Secure dressing with tape as directed. Compression Wrap: Kerlix Roll 4.5x3.1 (in/yd) (Generic) Every Other Day/15 Days Discharge Instructions: Apply Kerlix and Coban compression as directed. Patient Medications llergies: No Known Allergies A Notifications Medication Indication Start End 11/19/2021 lidocaine DOSE topical 4 % cream - cream topical GLYCEMIA INTERVENTIONS PROTOCOL PRE-HBO GLYCEMIA INTERVENTIONS ACTION INTERVENTION Obtain pre-HBO capillary blood glucose (ensure 1 physician order is in chart). A. Notify HBO physician and await physician orders. 2 If result is 70 mg/dl or below: [name], [name] ([id number]) [id number] Page 5 of 10 2 If result is 70 mg/dl or below: B. If the result meets the hospital definition of a critical result, follow hospital policy. A. Give patient an 8 ounce Glucerna Shake, an 8 ounce Ensure, or 8 ounces of a Glucerna/Ensure equivalent dietary supplement*. B. Wait 30 minutes. If result is 71 mg/dl to 130 mg/dl: C. Retest patients capillary blood glucose (CBG). D. If result greater than or equal to 110 mg/dl, proceed with HBO. If result less than 110 mg/dl, notify HBO physician and consider holding HBO. If  result is 131 mg/dl to 249 mg/dl: A. Proceed with HBO. A. Notify HBO physician and await physician orders. B. It is recommended to hold HBO and do If result is 250 mg/dl or greater: blood/urine ketone testing. C. If the result meets the hospital definition of a critical result, follow hospital policy. POST-HBO GLYCEMIA INTERVENTIONS ACTION INTERVENTION Obtain post HBO capillary blood glucose (ensure 1 physician order is in chart). A. Notify HBO physician  and await physician orders. 2 If result is 70 mg/dl or below: B. If the result meets the hospital definition of a critical result, follow hospital policy. A. Give patient an 8 ounce Glucerna Shake, an 8 ounce Ensure, or 8 ounces of a Glucerna/Ensure equivalent dietary supplement*. B. Wait 15 minutes for symptoms of If result is 71 mg/dl to 100 mg/dl: hypoglycemia (i.e. nervousness, anxiety, sweating, chills, clamminess, irritability, confusion, tachycardia or dizziness). C. If patient asymptomatic, discharge patient. If patient symptomatic, repeat capillary blood glucose (CBG) and notify HBO physician. If result is 101 mg/dl to 249 mg/dl: A. Discharge patient. A. Notify HBO physician and await physician orders. B. It is recommended to do blood/urine ketone If result is 250 mg/dl or greater: testing. C. If the result meets the hospital definition of a critical result, follow hospital policy. *Juice or candies are NOT equivalent products. If patient refuses the Glucerna or Ensure, please consult the hospital dietitian for an appropriate substitute. Electronic Signature(s) Signed: 11/19/2021 11:15:04 AM By: [name] MD FACS Entered By: [name] on 11/19/2021 08:07:26 -------------------------------------------------------------------------------- Problem List Details Patient Name: Date of Service: [name], [name] 11/19/2021 7:45 [name] Medical Record Number: [id number] Patient Account Number:  [id number] Date of Birth/Sex: Treating RN: [birth year] (51 y.o. 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 6/75/9163 No Yes M86.671 Other chronic osteomyelitis, right ankle and foot 07/04/2021 No Yes [name], [name] ([id number]) [id number] Page 6 of 10 L97.514 Non-pressure chronic ulcer of other part of right foot with necrosis of bone 07/04/2021 No Yes Inactive Problems Resolved Problems Electronic Signature(s) Signed: 11/19/2021 8:03:10 AM By: [name] MD FACS Entered By: [name] on 11/19/2021 08:03:09 -------------------------------------------------------------------------------- Progress Note Details Patient Name: Date of Service: [name], [name] 11/19/2021 7:45 [name] Medical Record Number: [id number] Patient Account Number: [id number] Date of Birth/Sex: Treating RN: [birth year] (51 y.o. 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 The most recent amputation was in August 2022. At that operation, antibiotic beads were placed in the wound. antibiotic beads were placed in the wound. He has been managed by podiatry for his procedures and management of his wounds. He has been in a Water engineer. He is on oral doxycycline. They have been using  Betadine wet to dry dressings along with Iodosorb. The patient states that when he thinks the wound is getting too dry, he applies topical Neosporin. At his last visit, on June 7 of this year, the podiatrist determined that he felt the wound was stalled and referred him to wound care for additional evaluation and management. An MRI has been ordered, but not yet scheduled or performed. Pathology from his operation in August 2022 demonstrated findings consistent with chronic osteomyelitis. Today, there is a large irregular wound on the plantar surface of his right foot, at about the level of the fifth metatarsal head. This tracks through to a pinpoint opening on the dorsal lateral portion of his foot. The intake  nurse reported purulent drainage. There is some malodor from the wound. No frank necrosis identified. 07/11/2021: Today, the wounds do not connect. I attempted multiple times from various directions and the shared tunnel is no longer open. He has some slough accumulation on the dorsal part of his foot as well as slough and callus buildup on the plantar surface. His MRI is scheduled for June 29. No purulent drainage or malodor appreciated today. 07/19/2021: The lateral foot wound has closed and there is no tunnel connecting the plantar foot wound to that site. The plantar foot wound still probes quite deeply, however. There is some slough, eschar, and nonviable tissue accumulated in the wound bed. No malodorous drainage present. His MRI was performed yesterday and is consistent with fairly extensive osteomyelitis. 07/29/2021: The patient has an appointment with infectious disease on July 18 to treat his osteomyelitis. The plantar wound still probes quite deeply, approaching bone. The orifice has narrowed quite substantially, however, making it more difficult for him to pack. 08/05/2021: The tunnel connecting the lateral foot wound to the plantar foot wound has reopened. He sees infectious disease  tomorrow to discuss long-term antibiotic treatment for osteomyelitis. There was a bit of murky drainage in the wound, but this was noted after he had had topical lidocaine applied so may have just been a blob of the anesthetic. No odor or frank purulent drainage. The wound probes deeply at the midfoot approaching bone. He does have MRI results confirming his diagnosis of osteomyelitis. He has failed to progress with conventional treatment and I think his best chance for preservation of the foot is to initiate hyperbaric oxygen therapy. 08/13/2021: The tunnel connecting the 2 wounds has closed again. He has a PICC line and is getting IV daptomycin and cefepime. EKG and chest x-ray are within normal limits. He is scheduled to start hyperbaric oxygen therapy tomorrow. The wound in his midfoot probes deeply, approaching bone. The skin at the orifice continues to heaped up and threatens to close over despite the large cavity within. No erythema, induration, or purulent drainage. The wound on his lateral foot is fairly small and quite clean. 08/19/2021: The lateral foot wound has nearly closed. The wound in his midfoot does not probe quite as deeply today. The skin at the orifice continues to try to roll in and obscure the opening. No frankly necrotic tissue appreciated. He is tolerating hyperbaric oxygen therapy. 08/26/2021: The lateral foot wound has closed completely. The wound in his midfoot is shallower again today. The wound orifice is contracting. We are using gentamicin and silver alginate. He continues on IV daptomycin and cefepime and is tolerating hyperbaric oxygen therapy without difficulty. 09/02/2021: His foot is a little bit sore today but he was up walking on it all weekend doing back to school shopping with his daughter. The tunneling is down to [name], [name] ([id number]) [id number] Page 7 of 10 about 3 cm and I do not appreciate bone at the tip of the probe. The  wound is clean but has some callus creating an overhanging lip at the distal aspect. He continues to receive hyperbaric oxygen therapy as well as IV daptomycin and cefepime. 09/09/2021: His wound continues to improve. The tunnel has come in by 0.9 cm. He continues to form callus around the orifice of the wound. He is tolerating hyperbaric oxygen therapy along with his IV antibiotics. 09/16/2021: The tunneling is down to just half a centimeter. He continues to build up callus around the orifice of the wound.  He completed his course of IV antibiotics and his PICC line is scheduled to be removed this Friday. He is tolerating hyperbaric oxygen without difficulty. We have been applying topical gentamicin and silver alginate to his wound. 09/24/2021: The wound depth has come in even further. There is a little bit of callus buildup around the orifice. The opening is quite narrow, at this point. 09/30/2021: The wound depth is about the same this week. The opening continues to contract with callus accumulation. There is some discoloration of the skin on the lateral part of his foot and he admitted to walking more than usual this weekend and also wearing a different pair shoes. He continues to tolerate hyperbaric oxygen therapy without difficulty. 10/07/2021: The wound depth has contracted a bit and is now approximately 1 to 1.5 cm. The orifice of the wound continues to try and close in over the space. There is just some callus and skin around the margins obscuring the orifice. 10/14/2021: The wound depth has come in a little bit more. The orifice of the wound is rolling inwards with epithelium and callus, making it difficult to pack the wound. 10/21/2021: There is still 1 portion of the wound, at the lateral aspect, that is still a couple of centimeters deep. The architecture of the wound makes this somewhat challenging to access and pack. Everything else looks clean. He has his usual accumulation of callus and skin,  narrowing the orifice. 10/29/2021: No significant change to the depth of the wound. The orifice continues to contract with callus and skin narrowing the opening. His wife has been packing the wound and doing an excellent job. 11/05/2021: The depth of the wound has come in by about a centimeter. There is less callus accumulation around the orificeoodespite this, the wound is narrower today. We are still awaiting insurance approval for Kerecis powder. 11/12/2021: The depth of the wound continues to contract. He has accumulated some callus around the orifice, as usual. We have received a verbal confirmation that he is approved for Kerecis, but no formal paperwork has yet been received. 11/19/2021: The depth continues to come in. There is callus accumulation around the orifice, as usual. He has been formally approved for Kerecis, but unfortunately we do not have a billing mechanism for the powder in [facility] yet. We are working to address this so we can begin treatment. Patient History Information obtained from Patient. Social History Never smoker, Marital Status - Married, Alcohol Use - Never, Drug Use - No History, Caffeine Use - Daily - T coffee. ea; Medical History Endocrine Patient has history of Type II Diabetes Hospitalization/Surgery History - Amuptation of 3rd,4th and 5th toes of Right foot;Oral Surgery;Anal Fissure surgery; Cholecystectomy. Objective Constitutional Slightly hypertensive. No acute distress.. Vitals Time Taken: 7:51 AM, Height: 72 in, Weight: 312 lbs, BMI: 42.3, Temperature: 98.6 F, Pulse: 86 bpm, Respiratory Rate: 16 breaths/min, Blood Pressure: 143/73 mmHg. Respiratory Normal work of breathing on room air.. General Notes: 11/19/2021: The depth continues to come in. There is callus accumulation around the orifice, as usual. Integumentary (Hair, Skin) Wound #2 status is Open. Original cause of wound was Gradually Appeared. The date acquired was: 12/19/2016. The wound  has been in treatment 19 weeks. The wound is located on the [place]. The wound measures 0.4cm length x 1cm width x 1.1cm depth; 0.314cm^2 area and 0.346cm^3 volume. There is Fat Layer (Subcutaneous Tissue) exposed. There is no tunneling or undermining noted. There is a medium amount of serosanguineous drainage noted. The wound  margin is thickened. There is large (67-100%) pink granulation within the wound bed. There is a small (1-33%) amount of necrotic tissue within the wound bed including Adherent Slough. The periwound skin appearance had no abnormalities noted for moisture. The periwound skin appearance had no abnormalities noted for color. The periwound skin appearance exhibited: Callus. Periwound temperature was noted as No Abnormality. 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 There was a Selective/Open Wound Non-Viable Tissue Debridement with a total area of 0.4 sq cm performed by [name], MD. With the following instrument(s): Curette to remove Non-Viable tissue/material. Material removed includes Callus after achieving pain control using Lidocaine 4% T opical Solution. No specimens were taken. A time out was conducted at 07:58, prior to the start of the procedure. A Minimum amount of bleeding was controlled with Pressure. The procedure was tolerated well. Post Debridement Measurements: 0.4cm length x 1cm width x 1.1cm depth; 0.346cm^3 volume. Character of Wound/Ulcer Post Debridement is  improved. Severity of Tissue Post Debridement is: Fat layer exposed. Post procedure Diagnosis Wound #2: Same as Pre-Procedure General Notes: scribed for [name] by Adline Peals, RN. Plan Follow-up Appointments: Return Appointment in 1 week. - Dr Celine Ahr Room 2 Anesthetic: Wound #2 Right,Lateral,Plantar Foot: (In clinic) Topical Lidocaine 4% applied to wound bed Cellular or Tissue Based Products: Cellular or Tissue Based Product Type: - run insurance for a skin substitute - Kerecis powder Bathing/ Shower/ Hygiene: Other Bathing/Shower/Hygiene Orders/Instructions: - Change dressing after bathing Off-Loading: Other: - Try to not stand on your feet too much Hyperbaric Oxygen Therapy: Evaluate for HBO Therapy Indication: - wagner grade 3 diabetic foot ulcer right foot 2.0 ATA for 90 Minutes without Air Breaks T Number of Treatments: - 40 more treatments for a total of 80 09/30/21 otal One treatments per day (delivered Monday through Friday unless otherwise specified in Special Instructions below): Finger stick Blood Glucose Pre- and Post- HBOT Treatment. Follow Hyperbaric Oxygen Glycemia Protocol Afrin (Oxymetazoline HCL) 0.05% nasal spray - 1 spray in both nostrils daily as needed prior to HBO treatment for difficulty clearing ears The following medication(s) was prescribed: lidocaine topical 4 % cream cream topical was prescribed at facility WOUND #2: - Foot Wound Laterality: Plantar, Right, Lateral Cleanser: Soap and Water Every Other Day/15 Days Discharge Instructions: May shower and wash wound with dial antibacterial soap and water prior to dressing change. Cleanser: Wound Cleanser (Generic) Every Other Day/15 Days Discharge Instructions: Cleanse the wound with wound cleanser prior to applying a clean dressing using gauze sponges, not tissue or cotton balls. Peri-Wound Care: cotton tipped applicators (Generic) Every Other Day/15 Days Topical: Gentamicin Every Other Day/15  Days Discharge Instructions: As directed by physician Prim Dressing: Iodoform packing strip 1/4 (in) Every Other Day/15 Days ary Discharge Instructions: Lightly pack as instructed Secondary Dressing: Woven Gauze Sponge, Non-Sterile 4x4 in Every Other Day/15 Days Discharge Instructions: Apply over primary dressing as directed. Secured With: Paper T ape, 2x10 (in/yd) (Generic) Every Other Day/15 Days Discharge Instructions: Secure dressing with tape as directed. Com pression Wrap: Kerlix Roll 4.5x3.1 (in/yd) (Generic) Every Other Day/15 Days Discharge Instructions: Apply Kerlix and Coban  compression as directed. 11/19/2021: The depth continues to come in. There is callus accumulation around the orifice, as usual. I used a curette to debride the callus from around the wound. We will continue to pack it with iodoform packing strips impregnated with gentamicin ointment. Hopefully we will be able to resolve the [facility] billing issue and begin applications next week. He will continue with his hyperbaric oxygen therapy. Follow-up with me in 1 week's time. Electronic Signature(s) Signed: 11/19/2021 8:08:10 AM By: [name] MD FACS Entered By: [name] on 11/19/2021 08:08:10 [name], [name] ([id number]) [id number] Page 9 of 10 -------------------------------------------------------------------------------- HxROS Details Patient Name: Date of Service: [name], [name] 11/19/2021 7:45 [name] Medical Record Number: [id number] Patient Account Number: [id number] Date of Birth/Sex: Treating RN: 1970/08/07 (51 y.o. [name] Primary Care Provider: [name] Other Clinician: Referring Provider: Treating Provider/Extender: [name] in Treatment: 19 Information Obtained From Patient Endocrine Medical History: Positive for: Type II Diabetes Immunizations Pneumococcal Vaccine: Received Pneumococcal Vaccination: Yes Received  Pneumococcal Vaccination On or After 60th Birthday: No Implantable Devices None Hospitalization / Surgery History Type of Hospitalization/Surgery Amuptation of 3rd,4th and 5th toes of Right foot;Oral Surgery;Anal Fissure surgery; Cholecystectomy Family and Social History Never smoker; Marital Status - Married; Alcohol Use: Never; Drug Use: No History; Caffeine Use: Daily - T coffee; Financial Concerns: No; Food, ea; Clothing or Shelter Needs: No; Support System Lacking: No; Transportation Concerns: No Electronic Signature(s) Signed: 11/19/2021 11:15:04 AM By: [name] MD FACS Entered By: [name] on 11/19/2021 08:06:43 -------------------------------------------------------------------------------- SuperBill Details Patient Name: Date of Service: [name], [name] 11/19/2021 Medical Record Number: [id number] Patient Account Number: [id number] Date of Birth/Sex: Treating RN: [birth year] (51 y.o. [name] Primary Care Provider: [name] Other Clinician: Referring Provider: Treating Provider/Extender: [name] in Treatment: 19 Diagnosis Coding ICD-10 Codes Code Description E11.49 Type 2 diabetes mellitus with other diabetic neurological complication K80.034 Other chronic osteomyelitis, right ankle and foot L97.514 Non-pressure chronic ulcer of other part of right foot with necrosis of bone Facility Procedures : [name], E CPT4 Code: [id number] [name] ([id number] Description: [phone] - DEBRIDE WOUND 1ST 20 SQ CM OR < ) [id number] Modifier: [id number] Quantity: 1 [id number] Page 10 of 10 : CPT4 Code: I Description: CD-10 Diagnosis Description L97.514 Non-pressure chronic ulcer of other part of right foot with necrosis of bone Modifier: Quantity: Physician Procedures : CPT4 Code Description Modifier [id number] - WC PHYS LEVEL 4 - EST PT 25 ICD-10 Diagnosis Description L97.514 Non-pressure chronic ulcer of other part of right foot  with necrosis of bone M86.671 Other chronic osteomyelitis, right ankle and foot  E11.49 Type 2 diabetes mellitus with other diabetic neurological complication Quantity: 1 : [id number] - WC PHYS DEBR WO ANESTH 20 SQ CM ICD-10 Diagnosis Description L97.514 Non-pressure chronic ulcer of other part of right foot with necrosis of bone Quantity: 1 Electronic Signature(s) Signed: 11/19/2021 8:08:26 AM By: [name] MD FACS Entered By: [name] on 11/19/2021 88:11:03

## 2021-11-19 NOTE — Progress Notes (Addendum)
Anzaldo, Fidencio M ([id number]) [id number] Page 1 of 8 Visit Report for 11/19/2021 Arrival Information Details Patient Name: Date of Service: [name], [name] 11/19/2021 7:45 A M Medical Record Number: [id number] Patient Account Number: [id number] Date of Birth/Sex: Treating RN: 06/25/1970 (51 y.o. M) Herrington, Taylor Primary Care Provider: Hunter, Stephen Other Clinician: Referring Provider: Treating Provider/Extender: Cannon, Jennifer Hunter, Stephen Weeks in Treatment: 19 Visit Information History Since Last Visit Added or deleted any medications: No Patient Arrived: Ambulatory Any new allergies or adverse reactions: No Arrival Time: 07:50 Had a fall or experienced change in No Accompanied By: self activities of daily living that may affect Transfer Assistance: None risk of falls: Patient Identification Verified: Yes Signs or symptoms of abuse/neglect since last visito No Secondary Verification Process Completed: Yes Hospitalized since last visit: No Patient Requires Transmission-Based Precautions: No Implantable device outside of the clinic excluding No Patient Has Alerts: Yes cellular tissue based products placed in the center since last visit: Has Dressing in Place as Prescribed: Yes Pain Present Now: No Electronic Signature(s) Signed: 11/19/2021 4:24:58 PM By: Herrington, Taylor Entered By: Herrington, Taylor on 11/19/2021 07:51:12 -------------------------------------------------------------------------------- Complex / Palliative Patient Assessment Details Patient Name: Date of Service: [name], [name] 11/19/2021 7:45 A M Medical Record Number: [id number] Patient Account Number: [id number] Date of Birth/Sex: Treating RN: 11/06/1970 (51 y.o. M) Boehlein, Linda Primary Care Provider: Hunter, Stephen Other Clinician: Referring Provider: Treating Provider/Extender: Cannon, Jennifer Hunter, Stephen Weeks in Treatment: 19 Complex Wound  Management Criteria Patient has remarkable or complex co-morbidities requiring medications or treatments that extend wound healing times. Examples: Diabetes mellitus with chronic renal failure or end stage renal disease requiring dialysis Advanced or poorly controlled rheumatoid arthritis Diabetes mellitus and end stage chronic obstructive pulmonary disease Active cancer with current chemo- or radiation therapy diabetes, prior toe amputations, osteomyelitis, HBOT Palliative Wound Management Criteria Care Approach Wound Care Plan: Complex Wound Management Electronic Signature(s) Signed: 11/21/2021 5:43:43 PM By: Boehlein, Linda RN, BSN Signed: 11/22/2021 7:31:02 AM By: Cannon, Jennifer MD FACS Entered By: Boehlein, Linda on 11/21/2021 17:09:39 Buckels, Jc M ([id number]) [id number] Page 2 of 8 -------------------------------------------------------------------------------- Encounter Discharge Information Details Patient Name: Date of Service: [name], [name] 11/19/2021 7:45 A M Medical Record Number: [id number] Patient Account Number: [id number] Date of Birth/Sex: Treating RN: 10/07/1970 (51 y.o. M) Herrington, Taylor Primary Care Provider: Hunter, Stephen Other Clinician: Referring Provider: Treating Provider/Extender: Cannon, Jennifer Hunter, Stephen Weeks in Treatment: 19 Encounter Discharge Information Items Post Procedure Vitals Discharge Condition: Stable Temperature (F): 98.6 Ambulatory Status: Ambulatory Pulse (bpm): 86 Discharge Destination: Home Respiratory Rate (breaths/min): 16 Transportation: Private Auto Blood Pressure (mmHg): 143/73 Accompanied By: self Schedule Follow-up Appointment: Yes Clinical Summary of Care: Patient Declined Electronic Signature(s) Signed: 11/19/2021 4:24:58 PM By: Herrington, Taylor Entered By: Herrington, Taylor on 11/19/2021  08:53:01 -------------------------------------------------------------------------------- Lower Extremity Assessment Details Patient Name: Date of Service: [name], [name] 11/19/2021 7:45 A M Medical Record Number: [id number] Patient Account Number: [id number] Date of Birth/Sex: Treating RN: 09/05/1970 (51 y.o. M) Herrington, Taylor Primary Care Provider: Hunter, Stephen Other Clinician: Referring Provider: Treating Provider/Extender: Cannon, Jennifer Hunter, Stephen Weeks in Treatment: 19 Edema Assessment Assessed: [Left: No] [Right: No] Edema: [Left: N] [Right: o] Calf Left: Right: Point of Measurement: From Medial Instep 39.5 cm Ankle Left: Right: Point of Measurement: From Medial Instep 24.4 cm Electronic Signature(s) Signed: 11/19/2021 4:24:58 PM By: Herrington, Taylor Entered By: Herrington, Taylor on 11/19/2021 07:52:01 Multi Wound Chart Details -------------------------------------------------------------------------------- Clements, Nain M (  [id number]) [id number] Page 3 of 8 Patient Name: Date of Service: [name], [name] 11/19/2021 7:45 A M Medical Record Number: [id number] Patient Account Number: [id number] Date of Birth/Sex: Treating RN: 12/22/1970 (51 y.o. M) Primary Care Provider: Hunter, Stephen Other Clinician: Referring Provider: Treating Provider/Extender: Cannon, Jennifer Hunter, Stephen Weeks in Treatment: 19 Vital Signs Height(in): 72 Pulse(bpm): 86 Weight(lbs): 312 Blood Pressure(mmHg): 143/73 Body Mass Index(BMI): 42.3 Temperature(°F): 98.6 Respiratory Rate(breaths/min): 16 Wound Assessments Wound Number: 2 N/A N/A Photos: N/A N/A Right, Lateral, Plantar Foot N/A N/A Wound Location: Gradually Appeared N/A N/A Wounding Event: Diabetic Wound/Ulcer of the Lower N/A N/A Primary Etiology: Extremity Type II Diabetes N/A N/A Comorbid History: 12/19/2016 N/A N/A Date Acquired: 19 N/A N/A Weeks of Treatment: Open  N/A N/A Wound Status: No N/A N/A Wound Recurrence: 0.4x1x1.1 N/A N/A Measurements L x W x D (cm) 0.314 N/A N/A A (cm) : rea 0.346 N/A N/A Volume (cm) : 91.30% N/A N/A % Reduction in A rea: 98.10% N/A N/A % Reduction in Volume: Grade 3 N/A N/A Classification: Medium N/A N/A Exudate A mount: Serosanguineous N/A N/A Exudate Type: red, brown N/A N/A Exudate Color: Thickened N/A N/A Wound Margin: Large (67-100%) N/A N/A Granulation A mount: Pink N/A N/A Granulation Quality: Small (1-33%) N/A N/A Necrotic A mount: Fat Layer (Subcutaneous Tissue): Yes N/A N/A Exposed Structures: Fascia: No Tendon: No Muscle: No Joint: No Bone: No Small (1-33%) N/A N/A Epithelialization: Debridement - Selective/Open Wound N/A N/A Debridement: Pre-procedure Verification/Time Out 07:58 N/A N/A Taken: Lidocaine 4% Topical Solution N/A N/A Pain Control: Callus N/A N/A Tissue Debrided: Non-Viable Tissue N/A N/A Level: 0.4 N/A N/A Debridement A (sq cm): rea Curette N/A N/A Instrument: Minimum N/A N/A Bleeding: Pressure N/A N/A Hemostasis A chieved: Procedure was tolerated well N/A N/A Debridement Treatment Response: 0.4x1x1.1 N/A N/A Post Debridement Measurements L x W x D (cm) 0.346 N/A N/A Post Debridement Volume: (cm) Callus: Yes N/A N/A Periwound Skin Texture: No Abnormalities Noted N/A N/A Periwound Skin Moisture: No Abnormalities Noted N/A N/A Periwound Skin Color: No Abnormality N/A N/A Temperature: Debridement N/A N/A Procedures Performed: Treatment Notes Electronic Signature(s) Signed: 11/19/2021 8:03:15 AM By: Cannon, Jennifer MD FACS Cullimore, Angelus M ([id number]) [id number] Page 4 of 8 Entered By: Cannon, Jennifer on 11/19/2021 08:03:15 -------------------------------------------------------------------------------- Multi-Disciplinary Care Plan Details Patient Name: Date of Service: [name], [name] 11/19/2021 7:45 A  M Medical Record Number: [id number] Patient Account Number: [id number] Date of Birth/Sex: Treating RN: 11/27/1970 (51 y.o. M) Herrington, Taylor Primary Care Provider: Hunter, Stephen Other Clinician: Referring Provider: Treating Provider/Extender: Cannon, Jennifer Hunter, Stephen Weeks in Treatment: 19 Multidisciplinary Care Plan reviewed with physician Active Inactive Nutrition Nursing Diagnoses: Impaired glucose control: actual or potential Potential for alteratiion in Nutrition/Potential for imbalanced nutrition Goals: Patient/caregiver will maintain therapeutic glucose control Date Initiated: 07/29/2021 Target Resolution Date: 12/20/2021 Goal Status: Active Interventions: Assess patient nutrition upon admission and as needed per policy Provide education on elevated blood sugars and impact on wound healing Treatment Activities: Patient referred to Primary Care Physician for further nutritional evaluation : 07/29/2021 Notes: Osteomyelitis Nursing Diagnoses: Infection: osteomyelitis Knowledge deficit related to disease process and management Goals: Patient/caregiver will verbalize understanding of disease process and disease management Date Initiated: 07/29/2021 Date Inactivated: 11/12/2021 Target Resolution Date: 11/19/2021 Goal Status: Met Patient's osteomyelitis will resolve Date Initiated: 07/29/2021 Target Resolution Date: 12/20/2021 Goal Status: Active Interventions: Assess for signs and symptoms of osteomyelitis resolution every visit Provide education on osteomyelitis Treatment Activities: Consult for HBO :   07/29/2021 Notes: Wound/Skin Impairment Nursing Diagnoses: Impaired tissue integrity Goals: Patient/caregiver will verbalize understanding of skin care regimen Date Initiated: 07/04/2021 Target Resolution Date: 12/20/2021 Goal Status: Active Interventions: Lutzke, Rance M ([id number]) [id number] Page 5 of 8 Assess ulceration(s) every  visit Treatment Activities: Skin care regimen initiated : 07/04/2021 Notes: Electronic Signature(s) Signed: 11/19/2021 4:24:58 PM By: Herrington, Taylor Entered By: Herrington, Taylor on 11/19/2021 08:52:20 -------------------------------------------------------------------------------- Pain Assessment Details Patient Name: Date of Service: [name], [name] 11/19/2021 7:45 A M Medical Record Number: [id number] Patient Account Number: [id number] Date of Birth/Sex: Treating RN: 02/20/1970 (51 y.o. M) Herrington, Taylor Primary Care Provider: Hunter, Stephen Other Clinician: Referring Provider: Treating Provider/Extender: Cannon, Jennifer Hunter, Stephen Weeks in Treatment: 19 Active Problems Location of Pain Severity and Description of Pain Patient Has Paino No Site Locations Rate the pain. Current Pain Level: 0 Pain Management and Medication Current Pain Management: Electronic Signature(s) Signed: 11/19/2021 4:24:58 PM By: Herrington, Taylor Entered By: Herrington, Taylor on 11/19/2021 07:51:19 -------------------------------------------------------------------------------- Patient/Caregiver Education Details Patient Name: Date of Service: [name], [name] 10/31/2023andnbsp7:45 A M Medical Record Number: [id number] Patient Account Number: [id number] Date of Birth/Gender: Treating RN: 01/30/1970 (51 y.o. M) Herrington, Taylor Primary Care Physician: Hunter, Stephen Other Clinician: Referring Physician: Treating Physician/Extender: Cannon, Jennifer Hunter, Stephen Weeks in Treatment: 19 Arismendez, Termaine M ([id number]) [id number] Page 6 of 8 Education Assessment Education Provided To: Patient Education Topics Provided Wound/Skin Impairment: Methods: Explain/Verbal Responses: Reinforcements needed, State content correctly Electronic Signature(s) Signed: 11/19/2021 4:24:58 PM By: Herrington, Taylor Entered By: Herrington, Taylor on 11/19/2021  08:52:32 -------------------------------------------------------------------------------- Wound Assessment Details Patient Name: Date of Service: [name], [name] 11/19/2021 7:45 A M Medical Record Number: [id number] Patient Account Number: [id number] Date of Birth/Sex: Treating RN: 10/05/1970 (51 y.o. M) Herrington, Taylor Primary Care Provider: Hunter, Stephen Other Clinician: Referring Provider: Treating Provider/Extender: Cannon, Jennifer Hunter, Stephen Weeks in Treatment: 19 Wound Status Wound Number: 2 Primary Etiology: Diabetic Wound/Ulcer of the Lower Extremity Wound Location: Right, Lateral, Plantar Foot Wound Status: Open Wounding Event: Gradually Appeared Comorbid History: Type II Diabetes Date Acquired: 12/19/2016 Weeks Of Treatment: 19 Clustered Wound: No Photos Wound Measurements Length: (cm) 0.4 Width: (cm) 1 Depth: (cm) 1.1 Area: (cm) 0.314 Volume: (cm) 0.346 % Reduction in Area: 91.3% % Reduction in Volume: 98.1% Epithelialization: Small (1-33%) Tunneling: No Undermining: No Wound Description Classification: Grade 3 Wound Margin: Thickened Exudate Amount: Medium Exudate Type: Serosanguineous Exudate Color: red, brown Foul Odor After Cleansing: No Slough/Fibrino Yes Wound Bed Granulation Amount: Large (67-100%) Exposed Structure Raybon, Horice M ([id number]) [id number] Page 7 of 8 Granulation Quality: Pink Fascia Exposed: No Necrotic Amount: Small (1-33%) Fat Layer (Subcutaneous Tissue) Exposed: Yes Necrotic Quality: Adherent Slough Tendon Exposed: No Muscle Exposed: No Joint Exposed: No Bone Exposed: No Periwound Skin Texture Texture Color No Abnormalities Noted: No No Abnormalities Noted: Yes Callus: Yes Temperature / Pain Temperature: No Abnormality Moisture No Abnormalities Noted: Yes Treatment Notes Wound #2 (Foot) Wound Laterality: Plantar, Right, Lateral Cleanser Soap and Water Discharge Instruction:  May shower and wash wound with dial antibacterial soap and water prior to dressing change. Wound Cleanser Discharge Instruction: Cleanse the wound with wound cleanser prior to applying a clean dressing using gauze sponges, not tissue or cotton balls. Peri-Wound Care cotton tipped applicators Topical Gentamicin Discharge Instruction: As directed by physician Primary Dressing Iodoform packing strip 1/4 (in) Discharge Instruction: Lightly pack as instructed Secondary Dressing Woven Gauze Sponge, Non-Sterile 4x4 in Discharge Instruction: Apply over primary dressing as directed.   Secured With Paper Tape, 2x10 (in/yd) Discharge Instruction: Secure dressing with tape as directed. Compression Wrap Kerlix Roll 4.5x3.1 (in/yd) Discharge Instruction: Apply Kerlix and Coban compression as directed. Compression Stockings Add-Ons Electronic Signature(s) Signed: 11/19/2021 4:24:58 PM By: Herrington, Taylor Entered By: Herrington, Taylor on 11/19/2021 07:55:56 -------------------------------------------------------------------------------- Vitals Details Patient Name: Date of Service: [name], [name] 11/19/2021 7:45 A M Medical Record Number: [id number] Patient Account Number: [id number] Date of Birth/Sex: Treating RN: 09/16/1970 (51 y.o. M) Herrington, Taylor Primary Care Provider: Hunter, Stephen Other Clinician: Referring Provider: Treating Provider/Extender: Cannon, Jennifer Hunter, Stephen Weeks in Treatment: 19 Vital Signs Ponds, Adeyemi M ([id number]) [id number] Page 8 of 8 Time Taken: 07:51 Temperature (°F): 98.6 Height (in): 72 Pulse (bpm): 86 Weight (lbs): 312 Respiratory Rate (breaths/min): 16 Body Mass Index (BMI): 42.3 Blood Pressure (mmHg): 143/73 Reference Range: 80 - 120 mg / dl Electronic Signature(s) Signed: 11/19/2021 4:24:58 PM By: Herrington, Taylor Entered By: Herrington, Taylor on 11/19/2021 07:51:56 

## 2021-11-19 NOTE — Progress Notes (Signed)
[name], Revis Z ([id number]) [id number] Page 1 of 2 Visit Report for 11/19/2021 Arrival Information Details Patient Name: Date of Service: [place], [place] [name] 11/19/2021 8:30 A M Medical Record Number: [id number] Patient Account Number: [id number] Date of Birth/Sex: Treating RN: 1970-08-28 (51 y.o. [name], [name] Primary Care Jovi Zavadil: [name] Other Clinician: [name] Referring Camila Norville: Treating Leidy Massar/Extender: [name] in Treatment: 85 Visit Information History Since Last Visit All ordered tests and consults were completed: Yes Patient Arrived: Ambulatory Added or deleted any medications: No Arrival Time: 08:15 Any new allergies or adverse reactions: No Accompanied By: None Had a fall or experienced change in No Transfer Assistance: None activities of daily living that may affect Patient Identification Verified: Yes risk of falls: Secondary Verification Process Completed: Yes Signs or symptoms of abuse/neglect since last visito No Patient Requires Transmission-Based Precautions: No Hospitalized since last visit: No Patient Has Alerts: Yes Implantable device outside of the clinic excluding No cellular tissue based products placed in the center since last visit: Pain Present Now: No Electronic Signature(s) Signed: 11/19/2021 3:28:53 PM By: [name] EMT Entered By: [name] on 11/19/2021 15:28:53 -------------------------------------------------------------------------------- Encounter Discharge Information Details Patient Name: Date of Service: [place], [name] 11/19/2021 8:30 A M Medical Record Number: [id number] Patient Account Number: [id number] Date of Birth/Sex: Treating RN: [birth year] (51 y.o. [name] Primary Care Rainy Rothman: [name] Other Clinician: [name] Referring Alexande Sheerin: Treating Merlyn Bollen/Extender: [name] in  Treatment: 19 Encounter Discharge Information Items Discharge Condition: Stable Ambulatory Status: Ambulatory Discharge Destination: Home Transportation: Private Auto Accompanied By: None Schedule Follow-up Appointment: Yes Clinical Summary of Care: Electronic Signature(s) Signed: 11/19/2021 3:34:22 PM By: [name] EMT Entered By: [name] on 11/19/2021 15:34:22 [name], Lamarr M ([id number]) [id number] Page 2 of 2 -------------------------------------------------------------------------------- Vitals Details Patient Name: Date of Service: [name], [name] 11/19/2021 8:30 A M Medical Record Number: [id number] Patient Account Number: [id number] Date of Birth/Sex: Treating RN: 1970-06-24 (51 y.o. [name], [name] Primary Care Kaheem Halleck: [name] Other Clinician: [name] Referring Rexine Gowens: Treating Jacen Carlini/Extender: [name] in Treatment: 19 Vital Signs Time Taken: 08:30 Temperature (F): 98.4 Height (in): 72 Pulse (bpm): 90 Weight (lbs): 312 Respiratory Rate (breaths/min): 18 Body Mass Index (BMI): 42.3 Blood Pressure (mmHg): 109/81 Capillary Blood Glucose (mg/dl): 160 Reference Range: 80 - 120 mg / dl Electronic Signature(s) Signed: 11/19/2021 3:29:26 PM By: [name] EMT Entered By: [name] on 11/19/2021 15:29:26

## 2021-11-20 ENCOUNTER — Encounter (HOSPITAL_BASED_OUTPATIENT_CLINIC_OR_DEPARTMENT_OTHER): Payer: BLUE CROSS/BLUE SHIELD | Attending: General Surgery | Admitting: General Surgery

## 2021-11-20 DIAGNOSIS — E1149 Type 2 diabetes mellitus with other diabetic neurological complication: Secondary | ICD-10-CM | POA: Insufficient documentation

## 2021-11-20 DIAGNOSIS — E1169 Type 2 diabetes mellitus with other specified complication: Secondary | ICD-10-CM | POA: Insufficient documentation

## 2021-11-20 DIAGNOSIS — Z89421 Acquired absence of other right toe(s): Secondary | ICD-10-CM | POA: Insufficient documentation

## 2021-11-20 DIAGNOSIS — L97514 Non-pressure chronic ulcer of other part of right foot with necrosis of bone: Secondary | ICD-10-CM | POA: Insufficient documentation

## 2021-11-20 DIAGNOSIS — E11621 Type 2 diabetes mellitus with foot ulcer: Secondary | ICD-10-CM | POA: Diagnosis not present

## 2021-11-20 DIAGNOSIS — M86671 Other chronic osteomyelitis, right ankle and foot: Secondary | ICD-10-CM | POA: Diagnosis not present

## 2021-11-20 LAB — GLUCOSE, CAPILLARY
Glucose-Capillary: 139 mg/dL — ABNORMAL HIGH (ref 70–99)
Glucose-Capillary: 172 mg/dL — ABNORMAL HIGH (ref 70–99)
Glucose-Capillary: 164 mg/dL — ABNORMAL HIGH (ref 70–99)

## 2021-11-20 NOTE — Progress Notes (Signed)
[name], [name] ([id number]) [id number] Page 1 of 2 Visit Report for 11/20/2021 Arrival Information Details Patient Name: Date of Service: [name], [name] 11/20/2021 8:00 [name] Medical Record Number: [id number] Patient Account Number: [id number] Date of Birth/Sex: Treating RN: 14-Jan-1971 [name] Primary Care Carrieanne Kleen: [name] Other Clinician: [name] Referring Sunny Aguon: Treating Joe Tanney/Extender: [name]: 52 Visit Information History Since Last Visit All ordered tests and consults were completed: Yes Patient Arrived: Ambulatory Added or deleted any medications: No Arrival Time: 07:34 Any new allergies or adverse reactions: No Accompanied By: None Had a fall or experienced change [name] No Transfer Assistance: None activities of daily living that may affect Patient Identification Verified: Yes risk of falls: Secondary Verification Process Completed: Yes Signs or symptoms of abuse/neglect since last visito No Patient Requires Transmission-Based Precautions: No Hospitalized since last visit: No Patient Has Alerts: Yes Implantable device outside of the clinic excluding No cellular tissue based products placed [name] the [name] since last visit: Pain Present Now: No Electronic Signature(s) Signed: 11/20/2021 2:08:13 PM By: [name] EMT Entered By: [name] on 11/20/2021 14:08:12 -------------------------------------------------------------------------------- Encounter Discharge Information Details Patient Name: Date of Service: [name], [name] 11/20/2021 8:00 [name] Medical Record Number: [id number] Patient Account Number: [id number] Date of Birth/Sex: Treating RN: November 24, 1970 [name] Primary Care Sevin Farone: [name] Other Clinician: [name] Referring Channah Godeaux: Treating Zyshonne Malecha/Extender: [name]:  51 Encounter Discharge Information Items Discharge Condition: Stable Ambulatory Status: Ambulatory Discharge Destination: Home Transportation: Private Auto Accompanied By: None Schedule Follow-up Appointment: Yes Clinical Summary of Care: Electronic Signature(s) Signed: 11/20/2021 2:12:18 PM By: [name] EMT Entered By: [name] on 11/20/2021 14:12:18 [name], [name] ([id number]) [id number] Page 2 of 2 -------------------------------------------------------------------------------- Vitals Details Patient Name: Date of Service: [name], [name] 11/20/2021 8:00 [name] Medical Record Number: [id number] Patient Account Number: [id number] Date of Birth/Sex: Treating RN: 03-15-1970 [name] Primary Care Karey Suthers: [name] Other Clinician: [name] Referring Sharissa Brierley: Treating Epifania Littrell/Extender: [name]9 Vital Signs Time Taken: 08:09 Temperature (F): 98.6 Height ([name]: 72 Pulse (bpm): 90 Weight (lbs): 312 Respiratory Rate (breaths/min): 18 Body Mass Index (BMI): 42.3 Blood Pressure (mmHg): 113/72 Capillary Blood Glucose (mg/dl): 172 Reference Range: 80 - 120 mg / dl Electronic Signature(s) Signed: 11/20/2021 2:08:45 PM By: [name] EMT Entered By: [name] on 11/20/2021 14:08:45

## 2021-11-20 NOTE — Progress Notes (Addendum)
[name], [facility] ([id number]) [id number] Page 1 of 2 Visit Report for 11/20/2021 HBO Details Patient Name: Date of Service: [name], [name] 11/20/2021 8:00 A M Medical Record Number: [id number] Patient Account Number: [id number] Date of Birth/Sex: Treating RN: 02-14-1970 [name] Primary Care [name]: [name] Other Clinician: Valeria [name] Referring [name]: Treating [name]/Extender: [name] in Treatment: 19 HBO Treatment Course Details Treatment Course Number: 1 Ordering [name]: [name] T Treatments Ordered: otal 80 HBO Treatment Start Date: 08/14/2021 HBO Indication: Diabetic Ulcer(s) of the Lower Extremity Standard/Conservative Wound Care tried and failed greater than or equal to 30 days HBO Treatment Details Treatment Number: 68 Patient Type: Outpatient Chamber Type: Monoplace Chamber Serial #: [id number] Treatment Protocol: 2.0 ATA with 90 minutes oxygen, and no air breaks Treatment Details Compression Rate Down: 2.0 psi / minute De-Compression Rate Up: 2.0 psi / minute Air breaks and breathing Decompress Decompress Compress Tx Pressure Begins Reached periods Begins Ends (leave unused spaces blank) Chamber Pressure (ATA 1 2 ------2 1 ) Clock Time (24 hr) 08:15 08:24 - - - - - - 09:54 10:02 Treatment Length: 107 (minutes) Treatment Segments: 4 Vital Signs Capillary Blood Glucose Reference Range: 80 - 120 mg / dl HBO Diabetic Blood Glucose Intervention Range: <131 mg/dl or >249 mg/dl Time Vitals Blood Respiratory Capillary Blood Glucose Pulse Action Type: Pulse: Temperature: Taken: Pressure: Rate: Glucose (mg/dl): Meter #: Oximetry (%) Taken: Pre 08:09 113/72 90 18 98.6 172 Post 10:10 132/88 74 18 97.2 164 Treatment Response Treatment Toleration: Well Treatment Completion Status: Treatment Completed without Adverse Event Additional Procedure Documentation Tissue  Sevierity: Necrosis of bone Physician HBO Attestation: I certify that I supervised this HBO treatment in accordance with Medicare guidelines. A trained emergency response team is readily available per Yes hospital policies and procedures. Continue HBOT as ordered. Yes Electronic Signature(s) Signed: 11/20/2021 4:43:22 PM By: [name] Maudlin MD FACS Previous Signature: 11/20/2021 2:11:22 PM Version By: [name] EMT Entered By: [name] on 11/20/2021 16:43:22 [name], [name] ([id number]) [id number] Page 2 of 2 -------------------------------------------------------------------------------- HBO Safety Checklist Details Patient Name: Date of Service: [name], [name] 11/20/2021 8:00 A M Medical Record Number: [id number] Patient Account Number: [id number] Date of Birth/Sex: Treating RN: 1970-03-05 [name] Primary Care [name]: [name] Other Clinician: Valeria [name] Referring [name]: Treating [name]/Extender: [name] in Treatment: 19 HBO Safety Checklist Items Safety Checklist Consent Form Signed Patient voided / foley secured and emptied When did you last eato 0630 Last dose of injectable or oral agent 0635 Ostomy pouch emptied and vented if applicable NA All implantable devices assessed, documented and approved NA Intravenous access site secured and place NA Valuables secured Linens and cotton and cotton/polyester blend (less than 51% polyester) Personal oil-based products / skin lotions / body lotions removed Wigs or hairpieces removed NA Smoking or tobacco materials removed Books / newspapers / magazines / loose paper removed Cologne, aftershave, perfume and deodorant removed Jewelry removed (may wrap wedding band) Make-up removed NA Hair care products removed Battery operated devices (external) removed Heating patches and chemical warmers removed Titanium eyewear  removed NA Nail polish cured greater than 10 hours NA Casting material cured greater than 10 hours NA Hearing aids removed NA Loose dentures or partials removed NA Prosthetics have been removed Patient demonstrates correct use of air break device (if applicable) Patient concerns have been addressed Patient grounding bracelet on and cord attached to chamber Specifics  for Inpatients (complete in addition to above) Medication sheet sent with patient NA Intravenous medications needed or due during therapy sent with patient NA Drainage tubes (e.g. nasogastric tube or chest tube secured and vented) NA Endotracheal or Tracheotomy tube secured NA Cuff deflated of air and inflated with saline NA Airway suctioned NA Notes The safety checklist was done before the treatment was started. Electronic Signature(s) Signed: 11/20/2021 2:10:05 PM By: [name] EMT Entered By: [name] on 11/20/2021 14:10:05

## 2021-11-20 NOTE — Progress Notes (Signed)
[name], [name] ([id number]) [id number] Page 1 of 2 Visit Report for 11/20/2021 Problem List Details Patient Name: Date of Service: [name], [name] 11/20/2021 8:00 A M Medical Record Number: [id number] Patient Account Number: [id number] Date of Birth/Sex: Treating RN: 06/12/1970 (51 y.o. 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 11/20/2021 Medical Record Number: [id number] Patient Account Number: [id number] Date of Birth/Sex: Treating RN: Jan 29, 1970 (51 y.o. 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 I37.048 Other chronic osteomyelitis, right ankle and foot L97.514 Non-pressure chronic ulcer of other part of right foot with necrosis of bone [name], Kyandre M ([id number]) [id number] Page 2 of  2 Facility Procedures : CPT4 Code Description: [id number] G0277-(Facility Use Only) HBOT full body chamber, 18mn , ICD-10 Diagnosis Description E11.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 L97.514 Non-pressure chronic ulcer of other part of right foo M86.671  Other chronic osteomyelitis, right ankle and foot Modifier: ical complication t with necrosis o Quantity: 4 f bone Physician Procedures : CPT4 Code Description Modifier [id number] 50569- WC PHYS HYPERBARIC OXYGEN THERAPY ICD-10 Diagnosis Description 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

## 2021-11-21 ENCOUNTER — Encounter (HOSPITAL_BASED_OUTPATIENT_CLINIC_OR_DEPARTMENT_OTHER): Payer: BLUE CROSS/BLUE SHIELD | Admitting: General Surgery

## 2021-11-21 DIAGNOSIS — E1149 Type 2 diabetes mellitus with other diabetic neurological complication: Secondary | ICD-10-CM | POA: Diagnosis not present

## 2021-11-21 NOTE — Progress Notes (Addendum)
[name], [name] ([id number]) [id number] Page 1 of 2 Visit Report for 11/21/2021 Arrival Information Details Patient Name: Date of Service: [name], [name] 11/21/2021 8:00 [name] Medical Record Number: [id number] Patient Account Number: [id number] Date of Birth/Sex: Treating RN: Nov 18, 1970 (51 y.o. [name], [name] Primary Care Natilee Gauer: [name] Other Clinician: [name] Referring Erika Hussar: Treating Shynice Sigel/Extender: [name] in Treatment: 20 Visit Information History Since Last Visit All ordered tests and consults were completed: Yes Patient Arrived: Ambulatory Added or deleted any medications: No Arrival Time: 07:45 Any new allergies or adverse reactions: No Accompanied By: None Had a fall or experienced change in No Transfer Assistance: None activities of daily living that may affect Patient Identification Verified: Yes risk of falls: Secondary Verification Process Completed: Yes Signs or symptoms of abuse/neglect since last visito No Patient Requires Transmission-Based Precautions: No Hospitalized since last visit: No Patient Has Alerts: Yes Implantable device outside of the clinic excluding No cellular tissue based products placed in the center since last visit: Pain Present Now: No Electronic Signature(s) Signed: 11/21/2021 12:12:05 PM By: [name] EMT Entered By: [name] on 11/21/2021 12:12:05 -------------------------------------------------------------------------------- Encounter Discharge Information Details Patient Name: Date of Service: [name], [name] 11/21/2021 8:00 [name] Medical Record Number: [id number] Patient Account Number: [id number] Date of Birth/Sex: Treating RN: August 21, 1970 (51 y.o. [name] Primary Care Letasha Kershaw: [name] Other Clinician: [name] Referring Rolan Wrightsman: Treating Donovon Micheletti/Extender: [name] in  Treatment: 20 Encounter Discharge Information Items Discharge Condition: Stable Ambulatory Status: Ambulatory Discharge Destination: Home Transportation: Private Auto Accompanied By: None Schedule Follow-up Appointment: Yes Clinical Summary of Care: Electronic Signature(s) Signed: 11/21/2021 2:13:20 PM By: [name] EMT Entered By: [name] on 11/21/2021 14:13:19 [name], [name] ([id number]) [id number] Page 2 of 2 -------------------------------------------------------------------------------- Vitals Details Patient Name: Date of Service: [name], [name] 11/21/2021 8:00 [name] Medical Record Number: [id number] Patient Account Number: [id number] Date of Birth/Sex: Treating RN: 1970-11-20 (51 y.o. [name] Primary Care Lyndal Alamillo: [name] Other Clinician: [name] Referring Makyiah Lie: Treating Josephina Melcher/Extender: [name] in Treatment: 20 Vital Signs Time Taken: 07:56 Temperature (F): 98.1 Height (in): 72 Pulse (bpm): 89 Weight (lbs): 312 Respiratory Rate (breaths/min): 16 Body Mass Index (BMI): 42.3 Blood Pressure (mmHg): 126/75 Capillary Blood Glucose (mg/dl): 163 Reference Range: 80 - 120 mg / dl Electronic Signature(s) Signed: 11/21/2021 12:12:37 PM By: [name] EMT Entered By: [name] on 11/21/2021 12:12:36

## 2021-11-21 NOTE — Progress Notes (Addendum)
[name], [name] ([id number]) [id number] Page 1 of 2 Visit Report for 11/21/2021 Problem List Details Patient Name: Date of Service: [name], [name] 11/21/2021 8:00 [name] Medical Record Number: [id number] Patient Account Number: [id number] Date of Birth/Sex: Treating RN: 06-23-1970 (51 y.o. 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 11/21/2021 Medical Record Number: [id number] Patient Account Number: [id number] Date of Birth/Sex: Treating RN: [birth year] (51 y.o. 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 H65.790 Other chronic osteomyelitis, right ankle and foot L97.514 Non-pressure chronic ulcer of other part of right foot with necrosis of bone [name], [name] ([id number]) [id number] Page 2 of  2 Facility Procedures : CPT4 Code Description: [id number] G0277-(Facility Use Only) HBOT full body chamber, 30mn , ICD-10 Diagnosis Description 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 L97.514 Non-pressure chronic ulcer of other part of right foo M86.671  Other chronic osteomyelitis, right ankle and foot Modifier: ical complication t with necrosis o Quantity: 4 f bone Physician Procedures : CPT4 Code Description Modifier [id number] 41423- WC PHYS HYPERBARIC OXYGEN THERAPY ICD-10 Diagnosis Description 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

## 2021-11-21 NOTE — Progress Notes (Signed)
[name], Josian [place] ([id number]) [id number] Page 1 of 2 Visit Report for 11/21/2021 HBO Details Patient Name: Date of Service: [name], [name] 11/21/2021 8:00 [name] Medical Record Number: [id number] Patient Account Number: [id number] Date of Birth/Sex: Treating RN: September 29, 1970 (51 y.o. [name] Primary Care Camelia Stelzner: [name] Other Clinician: [name] Referring Emersyn Wyss: Treating Gibbs Naugle/Extender: [name] in Treatment: 20 HBO Treatment Course Details Treatment Course Number: 1 Ordering Jeri Jeanbaptiste: [name] T Treatments Ordered: otal 80 HBO Treatment Start Date: 08/14/2021 HBO Indication: Diabetic Ulcer(s) of the Lower Extremity Standard/Conservative Wound Care tried and failed greater than or equal to 30 days HBO Treatment Details Treatment Number: 69 Patient Type: Outpatient Chamber Type: Monoplace Chamber Serial #: [id number] Treatment Protocol: 2.0 ATA with 90 minutes oxygen, and no air breaks Treatment Details Compression Rate Down: 2.0 psi / minute De-Compression Rate Up: 2.0 psi / minute Air breaks and breathing Decompress Decompress Compress Tx Pressure Begins Reached periods Begins Ends (leave unused spaces blank) Chamber Pressure (ATA 1 2 ------2 1 ) Clock Time (24 hr) 08:13 08:21 - - - - - - 09:51 10:00 Treatment Length: 107 (minutes) Treatment Segments: 4 Vital Signs Capillary Blood Glucose Reference Range: 80 - 120 mg / dl HBO Diabetic Blood Glucose Intervention Range: <131 mg/dl or >249 mg/dl Time Vitals Blood Respiratory Capillary Blood Glucose Pulse Action Type: Pulse: Temperature: Taken: Pressure: Rate: Glucose (mg/dl): Meter #: Oximetry (%) Taken: Pre 07:56 126/75 89 16 98.1 163 Post 10:06 143/93 76 18 98.3 170 Treatment Response Treatment Toleration: Well Treatment Completion Status: Treatment Completed without Adverse Event Additional Procedure Documentation Tissue  Sevierity: Necrosis of bone Physician HBO Attestation: I certify that I supervised this HBO treatment in accordance with Medicare guidelines. A trained emergency response team is readily available per Yes hospital policies and procedures. Continue HBOT as ordered. Yes Electronic Signature(s) Signed: 11/21/2021 12:39:52 PM By: [name] MD FACS Previous Signature: 11/21/2021 12:20:15 PM Version By: [name] EMT Entered By: [name] on 11/21/2021 12:39:52 [name], [name] ([id number]) [id number] Page 2 of 2 -------------------------------------------------------------------------------- HBO Safety Checklist Details Patient Name: Date of Service[name] [name] 11/21/2021 8:00 [name] Medical Record Number: [id number] Patient Account Number: [id number] Date of Birth/Sex: Treating RN: 05-Dec-1970 (51 y.o. [name] Primary Care Kayona Foor: [name] Other Clinician: [name] Referring Aniruddh Ciavarella: Treating Raneisha Bress/Extender: [name] in Treatment: 20 HBO Safety Checklist Items Safety Checklist Consent Form Signed Patient voided / foley secured and emptied When did you last eato 0630 Last dose of injectable or oral agent 0645 Ostomy pouch emptied and vented if applicable NA All implantable devices assessed, documented and approved NA Intravenous access site secured and place NA Valuables secured Linens and cotton and cotton/polyester blend (less than 51% polyester) Personal oil-based products / skin lotions / body lotions removed Wigs or hairpieces removed NA Smoking or tobacco materials removed Books / newspapers / magazines / loose paper removed Cologne, aftershave, perfume and deodorant removed Jewelry removed (may wrap wedding band) Make-up removed NA Hair care products removed Battery operated devices (external) removed Heating patches and chemical warmers removed Titanium eyewear  removed NA Nail polish cured greater than 10 hours NA Casting material cured greater than 10 hours NA Hearing aids removed NA Loose dentures or partials removed NA Prosthetics have been removed NA Patient demonstrates correct use of air break device (if applicable) Patient concerns have been addressed Patient grounding bracelet on and cord attached to chamber  Specifics for Inpatients (complete in addition to above) Medication sheet sent with patient NA Intravenous medications needed or due during therapy sent with patient NA Drainage tubes (e.g. nasogastric tube or chest tube secured and vented) NA Endotracheal or Tracheotomy tube secured NA Cuff deflated of air and inflated with saline NA Airway suctioned NA Notes The safety checklist was done before the treatment was started. Electronic Signature(s) Signed: 11/21/2021 12:13:34 PM By: [name] EMT Entered By: [name] on 11/21/2021 12:13:33

## 2021-11-22 ENCOUNTER — Encounter (HOSPITAL_BASED_OUTPATIENT_CLINIC_OR_DEPARTMENT_OTHER): Payer: BLUE CROSS/BLUE SHIELD | Admitting: General Surgery

## 2021-11-22 DIAGNOSIS — E1149 Type 2 diabetes mellitus with other diabetic neurological complication: Secondary | ICD-10-CM | POA: Diagnosis not present

## 2021-11-22 LAB — GLUCOSE, CAPILLARY
Glucose-Capillary: 163 mg/dL — ABNORMAL HIGH (ref 70–99)
Glucose-Capillary: 170 mg/dL — ABNORMAL HIGH (ref 70–99)
Glucose-Capillary: 180 mg/dL — ABNORMAL HIGH (ref 70–99)
Glucose-Capillary: 226 mg/dL — ABNORMAL HIGH (ref 70–99)

## 2021-11-22 NOTE — Progress Notes (Signed)
[name], [name] ([id number]) [id number] Page 1 of 2 Visit Report for 11/22/2021 Arrival Information Details Patient Name: Date of Service: [name], [name] 11/22/2021 8:00 [name] Medical Record Number: [id number] Patient Account Number: [id number] Date of Birth/Sex: Treating RN: Jul 17, 1970 (51 y.o. [name] Primary Care Hillel Card: [name] Other Clinician: [name] Referring Amazing Cowman: Treating Juana Haralson/Extender: [name] in Treatment: 20 Visit Information History Since Last Visit All ordered tests and consults were completed: Yes Patient Arrived: Ambulatory Added or deleted any medications: No Arrival Time: 07:40 Any new allergies or adverse reactions: No Accompanied By: None Had a fall or experienced change in No Transfer Assistance: None activities of daily living that may affect Patient Identification Verified: Yes risk of falls: Secondary Verification Process Completed: Yes Signs or symptoms of abuse/neglect since last visito No Patient Requires Transmission-Based Precautions: No Hospitalized since last visit: No Patient Has Alerts: Yes Implantable device outside of the clinic excluding No cellular tissue based products placed in the center since last visit: Pain Present Now: No Electronic Signature(s) Signed: 11/22/2021 3:24:18 PM By: [name] EMT Entered By: [name] on 11/22/2021 15:24:17 -------------------------------------------------------------------------------- Encounter Discharge Information Details Patient Name: Date of Service: [name], [name] 11/22/2021 8:00 [name] Medical Record Number: [id number] Patient Account Number: [id number] Date of Birth/Sex: Treating RN: 1970/04/30 (51 y.o. [name] Primary Care Deavin Forst: [name] Other Clinician: [name] Referring Emmitte Surgeon: Treating Alee Katen/Extender: [name] in  Treatment: 20 Encounter Discharge Information Items Discharge Condition: Stable Ambulatory Status: Ambulatory Discharge Destination: Home Transportation: Private Auto Accompanied By: None Schedule Follow-up Appointment: Yes Clinical Summary of Care: Electronic Signature(s) Signed: 11/22/2021 3:30:09 PM By: [name] EMT Entered By: [name] on 11/22/2021 15:30:08 [name], [name] ([id number]) [id number] Page 2 of 2 -------------------------------------------------------------------------------- Vitals Details Patient Name: Date of Service: [name], [name] 11/22/2021 8:00 [name] Medical Record Number: [id number] Patient Account Number: [id number] Date of Birth/Sex: Treating RN: 05/27/1970 (51 y.o. [name] Primary Care Bria Sparr: [name] Other Clinician: [name] Referring Masaji Billups: Treating Nyjah Schwake/Extender: [name] in Treatment: 20 Vital Signs Time Taken: 07:52 Temperature (F): 97.6 Height (in): 72 Pulse (bpm): 86 Weight (lbs): 312 Respiratory Rate (breaths/min): 18 Body Mass Index (BMI): 42.3 Blood Pressure (mmHg): 141/84 Capillary Blood Glucose (mg/dl): 201 Reference Range: 80 - 120 mg / dl Electronic Signature(s) Signed: 11/22/2021 3:24:45 PM By: [name] EMT Entered By: [name] on 11/22/2021 15:24:45

## 2021-11-22 NOTE — Progress Notes (Signed)
[name], [name] ([id number]) [id number] Page 1 of 2 Visit Report for 11/22/2021 HBO Details Patient Name: Date of Service: [name], [name] 11/22/2021 8:00 A M Medical Record Number: [id number] Patient Account Number: [id number] Date of Birth/Sex: Treating RN: 04/14/1970 [name] Primary Care [place] Mokry: [name] Other Clinician: [name] Referring Deaire Mcwhirter: Treating Shelvie Salsberry/Extender: [name] in Treatment: 20 HBO Treatment Course Details Treatment Course Number: 1 Ordering Waqas Bruhl: [name] T Treatments Ordered: otal 80 HBO Treatment Start Date: 08/14/2021 HBO Indication: Diabetic Ulcer(s) of the Lower Extremity Standard/Conservative Wound Care tried and failed greater than or equal to 30 days HBO Treatment Details Treatment Number: 70 Patient Type: Outpatient Chamber Type: Monoplace Chamber Serial #: [id number] Treatment Protocol: 2.0 ATA with 90 minutes oxygen, and no air breaks Treatment Details Compression Rate Down: 2.0 psi / minute De-Compression Rate Up: 2.0 psi / minute Air breaks and breathing Decompress Decompress Compress Tx Pressure Begins Reached periods Begins Ends (leave unused spaces blank) Chamber Pressure (ATA 1 2 ------2 1 ) Clock Time (24 hr) 08:06 08:14 - - - - - - 09:44 09:52 Treatment Length: 106 (minutes) Treatment Segments: 4 Vital Signs Capillary Blood Glucose Reference Range: 80 - 120 mg / dl HBO Diabetic Blood Glucose Intervention Range: <131 mg/dl or >249 mg/dl Time Vitals Blood Respiratory Capillary Blood Glucose Pulse Action Type: Pulse: Temperature: Taken: Pressure: Rate: Glucose (mg/dl): Meter #: Oximetry (%) Taken: Pre 07:52 141/84 86 18 97.6 201 Post 09:59 131/81 83 18 97.2 226 Treatment Response Treatment Toleration: Well Treatment Completion Status: Treatment Completed without Adverse Event Additional Procedure  Documentation Tissue Sevierity: Necrosis of bone Physician HBO Attestation: I certify that I supervised this HBO treatment in accordance with Medicare guidelines. A trained emergency response team is readily available per Yes hospital policies and procedures. Continue HBOT as ordered. Yes Electronic Signature(s) Signed: 11/22/2021 4:27:42 PM By: [name] MD FACS Previous Signature: 11/22/2021 3:28:07 PM Version By: [name] EMT Entered By: [name] on 11/22/2021 16:27:41 [name], [name] ([id number]) [id number] Page 2 of 2 -------------------------------------------------------------------------------- HBO Safety Checklist Details Patient Name: Date of Service: [name], [name] 11/22/2021 8:00 A M Medical Record Number: [id number] Patient Account Number: [id number] Date of Birth/Sex: Treating RN: Jan 05, 1971 [name] Primary Care [name]: [name] Other Clinician: [name] Referring Tavari Loadholt: Treating Tris Howell/Extender: [name] in Treatment: 20 HBO Safety Checklist Items Safety Checklist Consent Form Signed Patient voided / foley secured and emptied When did you last eato 0710 Last dose of injectable or oral agent 0655 Ostomy pouch emptied and vented if applicable NA All implantable devices assessed, documented and approved NA Intravenous access site secured and place NA Valuables secured Linens and cotton and cotton/polyester blend (less than 51% polyester) Personal oil-based products / skin lotions / body lotions removed Wigs or hairpieces removed NA Smoking or tobacco materials removed Books / newspapers / magazines / loose paper removed Cologne, aftershave, perfume and deodorant removed Jewelry removed (may wrap wedding band) Make-up removed NA Hair care products removed Battery operated devices (external) removed Heating patches and chemical warmers  removed Titanium eyewear removed NA Nail polish cured greater than 10 hours NA Casting material cured greater than 10 hours NA Hearing aids removed NA Loose dentures or partials removed NA Prosthetics have been removed NA Patient demonstrates correct use of air break device (if applicable) Patient concerns have been addressed Patient grounding bracelet on and cord attached to chamber  Specifics for Inpatients (complete in addition to above) Medication sheet sent with patient NA Intravenous medications needed or due during therapy sent with patient NA Drainage tubes (e.g. nasogastric tube or chest tube secured and vented) NA Endotracheal or Tracheotomy tube secured NA Cuff deflated of air and inflated with saline NA Airway suctioned NA Notes The safety checklist was done before the treatment was started. Electronic Signature(s) Signed: 11/22/2021 3:25:42 PM By: [name] EMT Entered By: [name] on 11/22/2021 15:25:42

## 2021-11-22 NOTE — Progress Notes (Signed)
[name], [name] ([id number]) [id number] Page 1 of 2 Visit Report for 11/22/2021 Problem List Details Patient Name: Date of Service: [name], [name] 11/22/2021 8:00 [name] Medical Record Number: [id number] Patient Account Number: [id number] Date of Birth/Sex: Treating RN: 22-Mar-1970 (51 y.o. 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 11/22/2021 Medical Record Number: [id number] Patient Account Number: [id number] Date of Birth/Sex: Treating RN: [birth year] (51 y.o. 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 I62.703 Other chronic osteomyelitis, right ankle and foot L97.514 Non-pressure chronic ulcer of other part of right foot with necrosis of bone [name], [name] ([id number]) [id number] Page 2  of 2 Facility Procedures : CPT4 Code Description: [id number] G0277-(Facility Use Only) HBOT full body chamber, 47mn , ICD-10 Diagnosis Description 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 L97.514 Non-pressure chronic ulcer of other part of right foo M86.671  Other chronic osteomyelitis, right ankle and foot Modifier: ical complication t with necrosis o Quantity: 4 f bone Physician Procedures : CPT4 Code Description Modifier [id number] 17510- WC PHYS HYPERBARIC OXYGEN THERAPY ICD-10 Diagnosis Description 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

## 2021-11-22 NOTE — Progress Notes (Signed)
[name], [name] ([id number]) [id number] Page 1 of 2 Visit Report for 11/18/2021 Problem List Details Patient Name: Date of Service: [name], [name] 11/18/2021 8:00 [name] Medical Record Number: [id number] Patient Account Number: [id number] Date of Birth/Sex: Treating RN: 1971/01/10 (51 y.o. 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 11/18/2021 Medical Record Number: [id number] Patient Account Number: [id number] Date of Birth/Sex: Treating RN: 1970-12-06 (51 y.o. 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 T51.761 Other chronic osteomyelitis, right ankle and foot L97.514 Non-pressure chronic ulcer of other part of right foot with necrosis of bone [name], [name] ([id number]) [id number] Page 2 of  2 Facility Procedures : CPT4 Code Description: [id number] G0277-(Facility Use Only) HBOT full body chamber, 92mn , ICD-10 Diagnosis Description 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 L97.514 Non-pressure chronic ulcer of other part of right foo M86.671  Other chronic osteomyelitis, right ankle and foot Modifier: ical complication t with necrosis o Quantity: 4 f bone Physician Procedures : CPT4 Code Description Modifier [id number] 38182- WC PHYS HYPERBARIC OXYGEN THERAPY ICD-10 Diagnosis Description 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

## 2021-11-22 NOTE — Progress Notes (Signed)
[name], Param Z ([id number]) [id number] Page 1 of 2 Visit Report for 11/19/2021 Problem List Details Patient Name: Date of Service: [name], [name] 11/19/2021 8:30 [name] Medical Record Number: [id number] Patient Account Number: [id number] Date of Birth/Sex: Treating RN: [birth year] 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 11/19/2021 Medical Record Number: [id number] Patient Account Number: [id number] Date of Birth/Sex: Treating RN: [birth year] 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 W41.324 Other chronic osteomyelitis, right ankle and foot L97.514 Non-pressure chronic ulcer of other part of right foot with necrosis of bone [name], [name] ([id number]) [id number] Page 2 of  2 Facility Procedures : CPT4 Code Description: [id number] G0277-(Facility Use Only) HBOT full body chamber, 62mn , ICD-10 Diagnosis Description 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 L97.514 Non-pressure chronic ulcer of other part of right foo M86.671  Other chronic osteomyelitis, right ankle and foot Modifier: ical complication t with necrosis o Quantity: 4 f bone Physician Procedures : CPT4 Code Description Modifier [id number] 87564- WC PHYS HYPERBARIC OXYGEN THERAPY ICD-10 Diagnosis Description 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

## 2021-11-25 ENCOUNTER — Encounter (HOSPITAL_BASED_OUTPATIENT_CLINIC_OR_DEPARTMENT_OTHER): Payer: BLUE CROSS/BLUE SHIELD | Admitting: General Surgery

## 2021-11-25 DIAGNOSIS — E1149 Type 2 diabetes mellitus with other diabetic neurological complication: Secondary | ICD-10-CM | POA: Diagnosis not present

## 2021-11-25 LAB — GLUCOSE, CAPILLARY
Glucose-Capillary: 208 mg/dL — ABNORMAL HIGH (ref 70–99)
Glucose-Capillary: 152 mg/dL — ABNORMAL HIGH (ref 70–99)

## 2021-11-25 NOTE — Progress Notes (Signed)
[name], [name] ([id number]) [id number] Page 1 of 2 Visit Report for 11/25/2021 Arrival Information Details Patient Name: Date of Service: [name], [name] 11/25/2021 8:30 A M Medical Record Number: [id number] Patient Account Number: [id number] Date of Birth/Sex: Treating RN: [birth year] [name], [name] Primary Care Gean Laursen: [name] Other Clinician: [name] Referring Christopherjame Carnell: Treating Anabella Capshaw/Extender: [name]: 20 Visit Information History Since Last Visit All ordered tests and consults were completed: Yes Patient Arrived: Ambulatory Added or deleted any medications: No Arrival Time: 08:27 Any new allergies or adverse reactions: No Accompanied By: None Had a fall or experienced change [name] No Transfer Assistance: None activities of daily living that may affect Patient Identification Verified: Yes risk of falls: Secondary Verification Process Completed: Yes Signs or symptoms of abuse/neglect since last visito No Patient Requires Transmission-Based Precautions: No Hospitalized since last visit: No Patient Has Alerts: Yes Implantable device outside of the clinic excluding No cellular tissue based products placed [name] the center since last visit: Pain Present Now: No Electronic Signature(s) Signed: 11/25/2021 2:28:08 PM By: [name] EMT Entered By: [name] on 11/25/2021 14:28:08 -------------------------------------------------------------------------------- Encounter Discharge Information Details Patient Name: Date of Service: [name], [name] 11/25/2021 8:30 A M Medical Record Number: [id number] Patient Account Number: [id number] Date of Birth/Sex: Treating RN: 01-22-1970 [name] Primary Care Zakiya Sporrer: [name] Other Clinician: [name] Referring Dezmon Conover: Treating Conan Mcmanaway/Extender: [name]  Treatment: 20 Encounter Discharge Information Items Discharge Condition: Stable Ambulatory Status: Ambulatory Discharge Destination: Home Transportation: Private Auto Accompanied By: None Schedule Follow-up Appointment: Yes Clinical Summary of Care: Electronic Signature(s) Signed: 11/25/2021 2:34:40 PM By: [name] EMT Entered By: [name] on 11/25/2021 14:34:40 [name], [name] ([id number]) [id number] Page 2 of 2 -------------------------------------------------------------------------------- Vitals Details Patient Name: Date of Service: [name], [name] 11/25/2021 8:30 A M Medical Record Number: [id number] Patient Account Number: [id number] Date of Birth/Sex: Treating RN: 05/14/1970 [name] Primary Care Artavia Jeanlouis: [name] Other Clinician: [name] Referring Allisyn Kunz: Treating Shenay Torti/Extender: [name]: 20 Vital Signs Time Taken: 08:40 Temperature (F): 97.9 Height ([name]: 72 Pulse (bpm): 86 Weight (lbs): 312 Respiratory Rate (breaths/min): 16 Body Mass Index (BMI): 42.3 Blood Pressure (mmHg): 128/77 Capillary Blood Glucose (mg/dl): 208 Reference Range: 80 - 120 mg / dl Electronic Signature(s) Signed: 11/25/2021 2:29:00 PM By: [name] EMT Entered By: [name] on 11/25/2021 14:29:00

## 2021-11-25 NOTE — Progress Notes (Signed)
[name], [name] ([id number]) [id number] Page 1 of 7 Visit Report for 11/25/2021 Arrival Information Details Patient Name: Date of Service: [name], [name] 11/25/2021 7:30 [name] Medical Record Number: [id number] Patient Account Number: [id number] Date of Birth/Sex: Treating RN: [birth year] [name] Primary Care [name]: [name] Other Clinician: Referring [name]: Treating [name]/Extender: [name] [name]: 20 Visit Information History Since Last Visit Added or deleted any medications: No Patient Arrived: Ambulatory Any new allergies or adverse reactions: No Arrival Time: 07:46 Had a fall or experienced change [name] No Accompanied By: self activities of daily living that may affect Transfer Assistance: None risk of falls: Patient Identification Verified: Yes Signs or symptoms of abuse/neglect since last visito No Secondary Verification Process Completed: Yes Hospitalized since last visit: No Patient Requires Transmission-Based Precautions: No Implantable device outside of the clinic excluding No Patient Has Alerts: Yes cellular tissue based products placed [name] the center since last visit: Has Dressing [name] Place as Prescribed: Yes Pain Present Now: No Electronic Signature(s) Signed: 11/25/2021 4:54:31 PM By: [name] Gouty RN, BSN Entered By: [name] on 11/25/2021 07:47:39 -------------------------------------------------------------------------------- Encounter Discharge Information Details Patient Name: Date of Service: [name], [name] 11/25/2021 7:30 [name] Medical Record Number: [id number] Patient Account Number: [id number] Date of Birth/Sex: Treating RN: 1970-12-19 [name] Primary Care [name]: [name] Other Clinician: Referring [name]: Treating [name]/Extender: [name] [name]: 20 Encounter Discharge Information  Items Post Procedure Vitals Discharge Condition: Stable Temperature (F): 98.7 Ambulatory Status: Ambulatory Pulse (bpm): 81 Discharge Destination: Home Respiratory Rate (breaths/min): 18 Transportation: Private Auto Blood Pressure (mmHg): 149/88 Accompanied By: self Schedule Follow-up Appointment: Yes Clinical Summary of Care: Patient Declined Electronic Signature(s) Signed: 11/25/2021 4:54:31 PM By: [name] Gouty RN, BSN Entered By: [name] on 11/25/2021 08:29:32 [name], [name] ([id number]) [id number] Page 2 of 7 -------------------------------------------------------------------------------- Lower Extremity Assessment Details Patient Name: Date of Service: [name], [name] 11/25/2021 7:30 [name] Medical Record Number: [id number] Patient Account Number: [id number] Date of Birth/Sex: Treating RN: September 21, 1970 (52 y.o. [name] Primary Care Shaira Sova: [name] Other Clinician: Referring [name]: Treating [name]/Extender: [name] [name]: 20 Edema Assessment Assessed: [name]: No] [Right: No] Edema: [Left: N] [Right: o] Calf Left: Right: Point of Measurement: From Medial Instep 39.5 cm Ankle Left: Right: Point of Measurement: From Medial Instep 24.4 cm Vascular Assessment Pulses: Dorsalis Pedis Palpable: [Right:Yes] Electronic Signature(s) Signed: 11/25/2021 4:54:31 PM By: [name] Gouty RN, BSN Entered By: [name] on 11/25/2021 07:52:22 -------------------------------------------------------------------------------- Multi Wound Chart Details Patient Name: Date of Service: [name], [name] 11/25/2021 7:30 [name] Medical Record Number: [id number] Patient Account Number: [id number] Date of Birth/Sex: Treating RN: 1970-01-23 (51 y.o. [name] Primary Care [name]: [name] Other Clinician: Referring [name]: Treating [name]/Extender: [name] [name]  Treatment: 20 Vital Signs Height([name]: 72 Capillary Blood Glucose(mg/dl): 213 Weight(lbs): 312 Pulse(bpm): 81 Body Mass Index(BMI): 42.3 Blood Pressure(mmHg): 149/88 Temperature(F): 98.7 Respiratory Rate(breaths/min): 18 [2:Photos:] [N/A:N/A] Right, Lateral, Plantar Foot N/A N/A Wound Location: Gradually Appeared N/A N/A Wounding Event: Diabetic Wound/Ulcer of the Lower N/A N/A Primary Etiology: Extremity Type II Diabetes N/A N/A Comorbid History: 12/19/2016 N/A N/A Date Acquired: 20 N/A N/A Weeks of Treatment: Open N/A N/A Wound Status: No N/A N/A Wound Recurrence: 0.2x1.3x1 N/A N/A Measurements L x W x D (cm) 0.204 N/A N/A A (cm) : rea 0.204 N/A N/A Volume (cm) :  94.40% N/A N/A % Reduction [name] A rea: 98.90% N/A N/A % Reduction [name] Volume: 12 Starting Position 1 (o'clock): 12 Ending Position 1 (o'clock): 0.5 Maximum Distance 1 (cm): Yes N/A N/A Undermining: Grade 3 N/A N/A Classification: Medium N/A N/A Exudate A mount: Serosanguineous N/A N/A Exudate Type: red, brown N/A N/A Exudate Color: Thickened N/A N/A Wound Margin: Large (67-100%) N/A N/A Granulation A mount: Red N/A N/A Granulation Quality: Small (1-33%) N/A N/A Necrotic A mount: Fat Layer (Subcutaneous Tissue): Yes N/A N/A Exposed Structures: Fascia: No Tendon: No Muscle: No Joint: No Bone: No Small (1-33%) N/A N/A Epithelialization: Callus: Yes N/A N/A Periwound Skin Texture: No Abnormalities Noted N/A N/A Periwound Skin Moisture: No Abnormalities Noted N/A N/A Periwound Skin Color: No Abnormality N/A N/A Temperature: Treatment Notes Electronic Signature(s) Signed: 11/25/2021 8:09:09 AM By: [name] Maudlin MD FACS Entered By: [name] on 11/25/2021 08:09:09 -------------------------------------------------------------------------------- Multi-Disciplinary Care Plan Details Patient Name: Date of Service: [name], [name] 11/25/2021 7:30 [name] Medical Record  Number: [id number] Patient Account Number: [id number] Date of Birth/Sex: Treating RN: [birth year] [name] Primary Care [name]: [name] Other Clinician: Referring [name]: Treating [name]: [name] [name]: [place] reviewed with physician Active Inactive Nutrition Nursing Diagnoses: Impaired glucose control: actual or potential Potential for alteratiion [name] Nutrition/Potential for imbalanced nutrition Goals: Patient/caregiver will maintain therapeutic glucose control Date Initiated: 07/29/2021 Target Resolution Date: 12/20/2021 Goal Status: Active Interventions: [name], [name] ([id number]) [id number] Page 4 of 7 Assess patient nutrition upon admission and as needed per policy Provide education on elevated blood sugars and impact on wound healing Treatment Activities: Patient referred to Primary Care Physician for further nutritional evaluation : 07/29/2021 Notes: Osteomyelitis Nursing Diagnoses: Infection: osteomyelitis Knowledge deficit related to disease process and management Goals: Patient/caregiver will verbalize understanding of disease process and disease management Date Initiated: 07/29/2021 Date Inactivated: 11/12/2021 Target Resolution Date: 11/19/2021 Goal Status: Met Patient's osteomyelitis will resolve Date Initiated: 07/29/2021 Target Resolution Date: 12/20/2021 Goal Status: Active Interventions: Assess for signs and symptoms of osteomyelitis resolution every visit Provide education on osteomyelitis Treatment Activities: Consult for HBO : 07/29/2021 Notes: Wound/Skin Impairment Nursing Diagnoses: Impaired tissue integrity Goals: Patient/caregiver will verbalize understanding of skin care regimen Date Initiated: 07/04/2021 Target Resolution Date: 12/20/2021 Goal Status: Active Interventions: Assess ulceration(s) every visit Treatment  Activities: Skin care regimen initiated : 07/04/2021 Notes: Electronic Signature(s) Signed: 11/25/2021 4:54:31 PM By: [name] Gouty RN, BSN Entered By: [name] on 11/25/2021 08:27:03 -------------------------------------------------------------------------------- Pain Assessment Details Patient Name: Date of Service: [name], [name] 11/25/2021 7:30 [name] Medical Record Number: [id number] Patient Account Number: [id number] Date of Birth/Sex: Treating RN: May 07, 1970 (50 y.o. [name] Primary Care Barbara Ahart: [name] Other Clinician: Referring Rozell Kettlewell: Treating Una Yeomans/Extender: [name] [name]: 20 Active Problems Location of Pain Severity and Description of Pain Patient Has Paino No Site Locations Rate the pain. [name], [name] ([id number]) [id number] Page 5 of 7 Rate the pain. Current Pain Level: 0 Pain Management and Medication Current Pain Management: Electronic Signature(s) Signed: 11/25/2021 4:54:31 PM By: [name] Gouty RN, BSN Entered By: [name] on 11/25/2021 07:49:55 -------------------------------------------------------------------------------- Patient/Caregiver Education Details Patient Name: Date of Service: [name], [name] 11/6/2023andnbsp7:30 [name] Medical Record Number: [id number] Patient Account Number: [id number] Date of Birth/Gender: Treating RN: December 28, 1970 [name] Primary Care Physician: [name] Other Clinician: Referring Physician: Treating Physician/Extender: [name] [name]  Treatment: 20 Education Assessment Education Provided To: Patient Education Topics Provided Elevated Blood Sugar/ Impact on Healing: Methods: Explain/Verbal Responses: Reinforcements needed, State content correctly Hyperbaric Oxygenation: Methods: Explain/Verbal Responses: Reinforcements needed, State content correctly Wound/Skin  Impairment: Methods: Explain/Verbal Responses: Reinforcements needed, State content correctly Electronic Signature(s) Signed: 11/25/2021 4:54:31 PM By: [name] Gouty RN, BSN Entered By: [name] on 11/25/2021 08:27:37 [name], [name] ([id number]) [id number] Page 6 of 7 -------------------------------------------------------------------------------- Wound Assessment Details Patient Name: Date of Service: [name], [name] 11/25/2021 7:30 [name] Medical Record Number: [id number] Patient Account Number: [id number] Date of Birth/Sex: Treating RN: Mar 12, 1970 [name] Primary Care Sonny Anthes: [name] Other Clinician: Referring Bellamie Turney: Treating Dreana Britz/Extender: [name] [name]: 20 Wound Status Wound Number: 2 Primary Etiology: Diabetic Wound/Ulcer of the Lower Extremity Wound Location: Right, Lateral, Plantar Foot Wound Status: Open Wounding Event: Gradually Appeared Comorbid History: Type II Diabetes Date Acquired: 12/19/2016 Weeks Of Treatment: 20 Clustered Wound: No Photos Wound Measurements Length: (cm) 0.2 Width: (cm) 1.3 Depth: (cm) 1 Area: (cm) 0.204 Volume: (cm) 0.204 % Reduction [name] Area: 94.4% % Reduction [name] Volume: 98.9% Epithelialization: Small (1-33%) Tunneling: No Undermining: Yes Starting Position (o'clock): 12 Ending Position (o'clock): 12 Maximum Distance: (cm) 0.5 Wound Description Classification: Grade 3 Wound Margin: Thickened Exudate Amount: Medium Exudate Type: Serosanguineous Exudate Color: red, brown Foul Odor After Cleansing: No Slough/Fibrino Yes Wound Bed Granulation Amount: Large (67-100%) Exposed Structure Granulation Quality: Red Fascia Exposed: No Necrotic Amount: Small (1-33%) Fat Layer (Subcutaneous Tissue) Exposed: Yes Necrotic Quality: Adherent Slough Tendon Exposed: No Muscle Exposed: No Joint Exposed: No Bone Exposed: No Periwound  Skin Texture Texture Color No Abnormalities Noted: No No Abnormalities Noted: Yes Callus: Yes Temperature / Pain Temperature: No Abnormality Moisture No Abnormalities Noted: Yes Treatment Notes [name], [name] ([id number]) [id number] Page 7 of 7 Wound #2 (Foot) Wound Laterality: Plantar, Right, Lateral Cleanser Soap and Water Discharge Instruction: May shower and wash wound with dial antibacterial soap and water prior to dressing change. Wound Cleanser Discharge Instruction: Cleanse the wound with wound cleanser prior to applying a clean dressing using gauze sponges, not tissue or cotton balls. Peri-Wound Care cotton tipped applicators Topical Primary Dressing kerecis Discharge Instruction: do not remove Secondary Dressing Woven Gauze Sponge, Non-Sterile 4x4 [name] Discharge Instruction: Apply over primary dressing as directed. Secured With Paper Tape, 2x10 ([name]/yd) Discharge Instruction: Secure dressing with tape as directed. Compression Wrap Kerlix Roll 4.5x3.1 ([name]/yd) Discharge Instruction: Apply Kerlix and Coban compression as directed. Compression Stockings Add-Ons Electronic Signature(s) Signed: 11/25/2021 4:54:31 PM By: [name] Gouty RN, BSN Entered By: [name] on 11/25/2021 07:59:12 -------------------------------------------------------------------------------- Vitals Details Patient Name: Date of Service: [name], [name] 11/25/2021 7:30 [name] Medical Record Number: [id number] Patient Account Number: [id number] Date of Birth/Sex: Treating RN: 12-Nov-1970 [name] Primary Care Trayvond Viets: [name] Other Clinician: Referring Beretta Ginsberg: Treating Alejah Aristizabal/Extender: [name] [name]: 20 Vital Signs Time Taken: 07:49 Temperature (F): 98.7 Height ([name]: 72 Pulse (bpm): 81 Weight (lbs): 312 Respiratory Rate (breaths/min): 18 Body Mass Index (BMI): 42.3 Blood Pressure (mmHg):  149/88 Capillary Blood Glucose (mg/dl): 213 Reference Range: 80 - 120 mg / dl Electronic Signature(s) Signed: 11/25/2021 4:54:31 PM By: [name] Gouty RN, BSN Entered By: [name] on 11/25/2021 07:49:44

## 2021-11-25 NOTE — Progress Notes (Signed)
[name], [name] ([id number]) [id number] Page 1 of 11 Visit Report for 11/25/2021 Chief Complaint Document Details Patient Name: Date of Service: [name], Mayetta [name] 11/25/2021 7:30 [name] Medical Record Number: [id number] Patient Account Number: [id number] Date of Birth/Sex: Treating RN: 1971-01-04 (51 y.o. [name] Primary Care Provider: [name] Other Clinician: Referring Provider: Treating Provider/Extender: [name] in Treatment: 20 Information Obtained from: Patient Chief Complaint Patients presents for treatment of an open diabetic ulcer Electronic Signature(s) Signed: 11/25/2021 8:20:36 AM By: [name] MD FACS Entered By: [name] on 11/25/2021 08:20:36 -------------------------------------------------------------------------------- Cellular or Tissue Based Product Details Patient Name: Date of Service: [name], [name] 11/25/2021 7:30 [name] Medical Record Number: [id number] Patient Account Number: [id number] Date of Birth/Sex: Treating RN: 1970-12-25 (51 y.o. [name] Primary Care Provider: [name] Other Clinician: Referring Provider: Treating Provider/Extender: [name] in Treatment: 20 Cellular or Tissue Based Product Type Wound #2 Right,Lateral,Plantar Foot Applied to: Performed By: Physician [name], MD Cellular or Tissue Based Product Type: Kerecis Omega3 Level of Consciousness (Pre-procedure): Awake and Alert Pre-procedure Verification/Time Out Yes - 08:10 Taken: Location: genitalia / hands / feet / multiple digits Wound Size (sq cm): 0.26 Product Size (sq cm): 21 Waste Size (sq cm): 15 Waste Reason: wound size Amount of Product Applied (sq cm): 6 Instrument Used: Forceps, Scissors Lot #: [id number] Order #: 1 Expiration Date: 06/20/2023 Fenestrated: No Reconstituted: Yes Solution Type: saline Solution Amount: 2 ml Lot #:  [id number] Solution Expiration Date: 06/19/2024 Secured: Yes Secured With: Steri-Strips Dressing Applied: Yes Primary Dressing: adaptic, gaize Procedural Pain: 0 Post Procedural Pain: 0 Response to Treatment: Procedure was tolerated well [name], [name] ([id number]) [id number] Page 2 of 11 Level of Consciousness (Post- Awake and Alert procedure): Post Procedure Diagnosis Same as Pre-procedure Electronic Signature(s) Signed: 11/25/2021 12:07:14 PM By: [name] MD FACS Signed: 11/25/2021 4:54:31 PM By: [name] RN, BSN Entered By: [name] on 11/25/2021 08:21:05 -------------------------------------------------------------------------------- Debridement Details Patient Name: Date of Service: [name], [name] 11/25/2021 7:30 [name] Medical Record Number: [id number] Patient Account Number: [id number] Date of Birth/Sex: Treating RN: Jun 12, 1970 (51 y.o. [name] Primary Care Provider: [name] Other Clinician: Referring Provider: Treating Provider/Extender: [name] in Treatment: 20 Debridement Performed for Assessment: Wound #2 Right,Lateral,Plantar Foot Performed By: Physician [name], MD Debridement Type: Debridement Severity of Tissue Pre Debridement: Fat layer exposed Level of Consciousness (Pre-procedure): Awake and Alert Pre-procedure Verification/Time Out Yes - 08:10 Taken: Start Time: 08:10 Pain Control: Lidocaine 4% T opical Solution T Area Debrided (L x W): otal 0.4 (cm) x 1.3 (cm) = 0.52 (cm) Tissue and other material debrided: Non-Viable, Callus, Skin: Epidermis Level: Skin/Epidermis Debridement Description: Selective/Open Wound Instrument: Curette Bleeding: Minimum Hemostasis Achieved: Pressure Procedural Pain: 0 Post Procedural Pain: 0 Response to Treatment: Procedure was tolerated well Level of Consciousness (Post- Awake and Alert procedure): Post Debridement  Measurements of Total Wound Length: (cm) 0.4 Width: (cm) 1.3 Depth: (cm) 1 Volume: (cm) 0.408 Character of Wound/Ulcer Post Debridement: Improved Severity of Tissue Post Debridement: Fat layer exposed Post Procedure Diagnosis Same as Pre-procedure Notes scribed by [name], RN for [name] Electronic Signature(s) Signed: 11/25/2021 4:54:31 PM By: [name] RN, BSN Signed: 11/26/2021 7:33:06 AM By: [name] MD FACS Previous Signature: 11/25/2021 12:07:14 PM Version By: [name] MD FACS Entered By: [name] on 11/25/2021 16:43:35 [name], [name] ([id number]) [id number] Page 3 of 11 --------------------------------------------------------------------------------  HPI Details Patient Name: Date of Service: [name], [name] 11/25/2021 7:30 [name] Medical Record Number: [id number] Patient Account Number: [id number] Date of Birth/Sex: Treating RN: 07-10-1970 (51 y.o. 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 The most recent amputation was in August 2022. At that operation, antibiotic beads were placed in the wound. He has been managed by podiatry for his procedures and management of his wounds. He has been in a [employer] [facility]. He is on oral doxycycline. They have been using Betadine wet to dry dressings along with Iodosorb. The patient states that when he thinks the wound is getting too dry, he applies topical Neosporin. At his last visit, on June 7 of this year, the podiatrist determined that he felt the wound was stalled and referred him to wound care for additional  evaluation and management. An MRI has been ordered, but not yet scheduled or performed. Pathology from his operation in August 2022 demonstrated findings consistent with chronic osteomyelitis. Today, there is a large irregular wound on the plantar surface of his right foot, at about the level of the fifth metatarsal head. This tracks through to a pinpoint opening on the dorsal lateral portion of his foot. The intake nurse reported purulent drainage. There is some malodor from the wound. No frank necrosis identified. 07/11/2021: Today, the wounds do not connect. I attempted multiple times from various directions and the shared tunnel is no longer open. He has some slough accumulation on the dorsal part of his foot as well as slough and callus buildup on the plantar surface. His MRI is scheduled for June 29. No purulent drainage or malodor appreciated today. 07/19/2021: The lateral foot wound has closed and there is no tunnel connecting the plantar foot wound to that site. The plantar foot wound still probes quite deeply, however. There is some slough, eschar, and nonviable tissue accumulated in the wound bed. No malodorous drainage present. His MRI was performed yesterday and is consistent with fairly extensive osteomyelitis. 07/29/2021: The patient has an appointment with infectious disease on July 18 to treat his osteomyelitis. The plantar wound still probes quite deeply, approaching bone. The orifice has narrowed quite substantially, however, making it more difficult for him to pack. 08/05/2021: The tunnel connecting the lateral foot wound to the plantar foot wound has reopened. He sees infectious disease tomorrow to discuss long-term antibiotic treatment for osteomyelitis. There was a bit of murky drainage in the wound, but this was noted after he had had topical lidocaine applied so may have just been a blob of the anesthetic. No odor or frank purulent drainage. The wound probes deeply at the midfoot  approaching bone. He does have MRI results confirming his diagnosis of osteomyelitis. He has failed to progress with conventional treatment and I think his best chance for preservation of the foot is to initiate hyperbaric oxygen therapy. 08/13/2021: The tunnel connecting the 2 wounds has closed again. He has a PICC line and is getting IV daptomycin and cefepime. EKG and chest x-ray are within normal limits. He is scheduled to start hyperbaric oxygen therapy tomorrow. The wound in his midfoot probes deeply, approaching bone. The skin at the orifice continues to heaped up  and threatens to close over despite the large cavity within. No erythema, induration, or purulent drainage. The wound on his lateral foot is fairly small and quite clean. 08/19/2021: The lateral foot wound has nearly closed. The wound in his midfoot does not probe quite as deeply today. The skin at the orifice continues to try to roll in and obscure the opening. No frankly necrotic tissue appreciated. He is tolerating hyperbaric oxygen therapy. 08/26/2021: The lateral foot wound has closed completely. The wound in his midfoot is shallower again today. The wound orifice is contracting. We are using gentamicin and silver alginate. He continues on IV daptomycin and cefepime and is tolerating hyperbaric oxygen therapy without difficulty. 09/02/2021: His foot is a little bit sore today but he was up walking on it all weekend doing back to school shopping with his daughter. The tunneling is down to about 3 cm and I do not appreciate bone at the tip of the probe. The wound is clean but has some callus creating an overhanging lip at the distal aspect. He continues to receive hyperbaric oxygen therapy as well as IV daptomycin and cefepime. 09/09/2021: His wound continues to improve. The tunnel has come in by 0.9 cm. He continues to form callus around the orifice of the wound. He is tolerating hyperbaric oxygen therapy along with his IV  antibiotics. 09/16/2021: The tunneling is down to just half a centimeter. He continues to build up callus around the orifice of the wound. He completed his course of IV antibiotics and his PICC line is scheduled to be removed this Friday. He is tolerating hyperbaric oxygen without difficulty. We have been applying topical gentamicin and silver alginate to his wound. 09/24/2021: The wound depth has come in even further. There is a little bit of callus buildup around the orifice. The opening is quite narrow, at this point. 09/30/2021: The wound depth is about the same this week. The opening continues to contract with callus accumulation. There is some discoloration of the skin on the lateral part of his foot and he admitted to walking more than usual this weekend and also wearing a different pair shoes. He continues to tolerate hyperbaric oxygen therapy without difficulty. 10/07/2021: The wound depth has contracted a bit and is now approximately 1 to 1.5 cm. The orifice of the wound continues to try and close in over the space. There is just some callus and skin around the margins obscuring the orifice. 10/14/2021: The wound depth has come in a little bit more. The orifice of the wound is rolling inwards with epithelium and callus, making it difficult to pack the wound. 10/21/2021: There is still 1 portion of the wound, at the lateral aspect, that is still a couple of centimeters deep. The architecture of the wound makes this somewhat challenging to access and pack. Everything else looks clean. He has his usual accumulation of callus and skin, narrowing the orifice. 10/29/2021: No significant change to the depth of the wound. The orifice continues to contract with callus and skin narrowing the opening. His wife has been packing the wound and doing an excellent job. [name], [name] ([id number]) [id number] Page 4 of 11 11/05/2021: The depth of the wound has come in by about a  centimeter. There is less callus accumulation around the orificedespite this, the wound is narrower today. We are still awaiting insurance approval for Kerecis powder. 11/12/2021: The depth of the wound continues to contract. He has accumulated some callus around the orifice, as usual. We have  received a verbal confirmation that he is approved for Kerecis, but no formal paperwork has yet been received. 11/19/2021: The depth continues to come in. There is callus accumulation around the orifice, as usual. He has been formally approved for Kerecis, but unfortunately we do not have a billing mechanism for the powder in [facility] yet. We are working to address this so we can begin treatment. 11/25/2021: Continued filling in of the depth of the wound. Continued buildup of callus around the orifice. No concern for infection. As we do not have a way to bill for Kerecis powder, but are capable of billing for the [facility] L [facility] sheet, we are going to use that on him instead. Electronic Signature(s) Signed: 11/25/2021 8:37:34 AM By: [name] MD FACS Entered By: [name] on 11/25/2021 08:37:34 -------------------------------------------------------------------------------- Physical Exam Details Patient Name: Date of Service: [name], [name] 11/25/2021 7:30 [name] Medical Record Number: [id number] Patient Account Number: [id number] Date of Birth/Sex: Treating RN: [birth year] (51 y.o. [name] Primary Care Provider: [name] Other Clinician: Referring Provider: Treating Provider/Extender: [name] in Treatment: 20 Constitutional Slightly hypertensive. . . . No acute distress.Marland Kitchen Respiratory Normal work of breathing on room air.. Notes 11/25/2021: The wound is a little bit shallower again. Continued callus accumulation at the wound orifice. Electronic Signature(s) Signed: 11/25/2021 8:39:43 AM By: [name] MD FACS Entered By: [name] on 11/25/2021  08:39:42 -------------------------------------------------------------------------------- Physician Orders Details Patient Name: Date of Service: [name], [name] 11/25/2021 7:30 [name] Medical Record Number: [id number] Patient Account Number: [id number] Date of Birth/Sex: Treating RN: 04/05/1970 (51 y.o. 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 - Dr Celine Ahr Room 2 Return A Anesthetic Wound #2 Right,Lateral,Plantar Foot (In clinic) Topical Lidocaine 4% applied to wound bed Cellular or Tissue Based Products Cellular or Tissue Based Product Type: - Kerecis #1 Cellular or Tissue Based Product applied to wound bed, secured with steri-strips, cover with Adaptic or Mepitel. (DO NOT REMOVE). Bathing/ Shower/ Hygiene May shower with protection but do not get wound dressing(s) wet. Off-Loading Other: - Try to not stand on your feet too much Hyperbaric Oxygen Therapy Evaluate for HBO Therapy Indication: - wagner grade 3 diabetic foot ulcer right foot 2.0 ATA for 90 Minutes without A Breaks ir Total Number of Treatments: - 40 more treatments for a total of 80 09/30/21 One treatments per day (delivered Monday through Friday unless otherwise specified in Special Instructions below): Finger stick Blood Glucose Pre- and Post- HBOT Treatment. Follow Hyperbaric Oxygen Glycemia Protocol Afrin (Oxymetazoline HCL) 0.05% nasal spray - 1 spray in both  nostrils daily as needed prior to HBO treatment for difficulty clearing ears Wound Treatment Wound #2 - Foot Wound Laterality: Plantar, Right, Lateral Cleanser: Soap and Water Every Other Day/15 Days Discharge Instructions: May shower and wash wound with dial antibacterial soap and water prior to dressing change. Cleanser: Wound Cleanser (Generic) Every Other Day/15 Days Discharge Instructions: Cleanse the wound with wound cleanser prior to applying a clean dressing using gauze sponges, not tissue or cotton balls. Peri-Wound Care: cotton  tipped applicators (Generic) Every Other Day/15 Days Prim Dressing: kerecis Every Other Day/15 Days ary Discharge Instructions: do not remove Secondary Dressing: Woven Gauze Sponge, Non-Sterile 4x4 in Every Other Day/15 Days Discharge Instructions: Apply over primary dressing as directed. Secured With: Paper Tape, 2x10 (in/yd) (Generic) Every Other Day/15 Days Discharge Instructions: Secure dressing with tape as directed. Compression Wrap: Kerlix Roll 4.5x3.1 (in/yd) (Generic) Every Other Day/15 Days Discharge Instructions: Apply Kerlix and Coban compression as directed. GLYCEMIA INTERVENTIONS PROTOCOL PRE-HBO GLYCEMIA INTERVENTIONS ACTION INTERVENTION Obtain pre-HBO capillary blood glucose (ensure 1 physician order is in chart). A. Notify HBO physician and await physician orders. 2 If result is 70 mg/dl or below: B. If the result meets the hospital definition of a critical result, follow hospital policy. A. Give patient an 8 ounce Glucerna Shake, an 8 ounce Ensure, or 8 ounces of a Glucerna/Ensure equivalent dietary supplement*. B. Wait 30 minutes. If result is 71 mg/dl to 130 mg/dl: C. Retest patients capillary blood glucose (CBG). D. If result greater than or equal to 110 mg/dl, proceed with HBO. If result less than 110 mg/dl, notify HBO physician and consider holding HBO. If result is 131 mg/dl to 249 mg/dl: A. Proceed with HBO. A.  Notify HBO physician and await physician orders. B. It is recommended to hold HBO and do If result is 250 mg/dl or greater: blood/urine ketone testing. C. If the result meets the hospital definition of a critical result, follow hospital policy. POST-HBO GLYCEMIA INTERVENTIONS ACTION INTERVENTION Obtain post HBO capillary blood glucose (ensure 1 physician order is in chart). A. Notify HBO physician and await physician orders. [name], Kameryn [name] ([id number]) [id number] Page 6 of 11 orders. 2 If result is 70 mg/dl or below: B. If the result meets the hospital definition of a critical result, follow hospital policy. A. Give patient an 8 ounce Glucerna Shake, an 8 ounce Ensure, or 8 ounces of a Glucerna/Ensure equivalent dietary supplement*. B. Wait 15 minutes for symptoms of If result is 71 mg/dl to 100 mg/dl: hypoglycemia (i.e. nervousness, anxiety, sweating, chills, clamminess, irritability, confusion, tachycardia or dizziness). C. If patient asymptomatic, discharge patient. If patient symptomatic, repeat capillary blood glucose (CBG) and notify HBO physician. If result is 101 mg/dl to 249 mg/dl: A. Discharge patient. A. Notify HBO physician and await physician orders. B. It is recommended to do blood/urine ketone If result is 250 mg/dl or greater: testing. C. If the result meets the hospital definition of a critical result, follow hospital policy. *Juice or candies are NOT equivalent products. If patient refuses the Glucerna or Ensure, please consult the hospital dietitian for an appropriate substitute. Electronic Signature(s) Signed: 11/25/2021 12:07:14 PM By: [name] MD FACS Entered By: [name] on 11/25/2021 08:39:55 -------------------------------------------------------------------------------- Problem List Details Patient Name: Date of Service: [name], [name] 11/25/2021 7:30 [name] Medical Record Number:  [id number] Patient Account Number: [id number] Date of Birth/Sex: Treating RN: [birth year] (51 y.o. 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 7/40/8144 No Yes M86.671 Other chronic osteomyelitis, right ankle and foot 07/04/2021 No Yes L97.514 Non-pressure chronic ulcer of other part of right foot with necrosis of bone 07/04/2021 No Yes Inactive Problems Resolved Problems Electronic Signature(s) Signed: 11/25/2021 8:08:58 AM By: [name] MD FACS Entered By: [name] on 11/25/2021 08:08:58 [name], [name] (  [id number]) [id number] Page 7 of 11 -------------------------------------------------------------------------------- Progress Note Details Patient Name: Date of Service: [name], [name] 11/25/2021 7:30 [name] Medical Record Number: [id number] Patient Account Number: [id number] Date of Birth/Sex: Treating RN: 1970-05-11 (51 y.o. 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 The most recent amputation was in August 2022. At that operation, antibiotic beads were placed in the wound. He has been managed by podiatry for his procedures and management of his wounds. He has been in a Retail banker. He is on oral doxycycline. They have been using Betadine wet to dry dressings along with Iodosorb. The patient states that when he thinks the wound is getting too dry, he applies topical Neosporin. At his last visit, on June 7 of this year, the podiatrist determined that he felt the wound was stalled and referred him to wound care for additional evaluation and management. An MRI has been ordered, but not yet scheduled or performed. Pathology from his operation in August 2022 demonstrated findings consistent with chronic osteomyelitis. Today, there is a large irregular wound on the plantar surface of his right foot, at about the level of the fifth metatarsal head. This tracks through to a pinpoint opening on the dorsal lateral portion of his foot. The intake nurse reported purulent drainage. There is some malodor from the wound. No frank necrosis identified. 07/11/2021: Today, the wounds do not connect. I attempted multiple times from various directions and the shared tunnel is no longer open. He has some slough accumulation on the dorsal part of his foot as well as slough and callus buildup on the plantar surface. His MRI is scheduled for June 29. No purulent drainage or malodor appreciated today. 07/19/2021: The lateral foot wound has closed and there is no tunnel connecting the plantar foot wound to that site. The plantar foot wound still probes quite deeply, however. There is some slough, eschar, and nonviable tissue accumulated in the wound bed. No malodorous drainage present. His MRI was performed yesterday and is consistent with fairly extensive osteomyelitis. 07/29/2021: The patient has an appointment with infectious disease on July 18 to treat his osteomyelitis. The plantar wound still probes quite deeply, approaching bone. The orifice has narrowed quite substantially, however, making it more difficult for him to pack. 08/05/2021: The tunnel connecting the lateral foot wound to the  plantar foot wound has reopened. He sees infectious disease tomorrow to discuss long-term antibiotic treatment for osteomyelitis. There was a bit of murky drainage in the wound, but this was noted after he had had topical lidocaine applied so may have just been a blob of the anesthetic. No odor or frank purulent drainage. The wound probes deeply at the midfoot approaching bone. He does have MRI results confirming his diagnosis of osteomyelitis. He has failed to progress with conventional treatment and I think his best chance for preservation of the foot is to initiate hyperbaric oxygen therapy. 08/13/2021: The tunnel connecting the 2 wounds has closed again. He has a PICC line and is getting IV daptomycin and cefepime. EKG and chest x-ray are within normal limits. He is scheduled to  start hyperbaric oxygen therapy tomorrow. The wound in his midfoot probes deeply, approaching bone. The skin at the orifice continues to heaped up and threatens to close over despite the large cavity within. No erythema, induration, or purulent drainage. The wound on his lateral foot is fairly small and quite clean. 08/19/2021: The lateral foot wound has nearly closed. The wound in his midfoot does not probe quite as deeply today. The skin at the orifice continues to try to roll in and obscure the opening. No frankly necrotic tissue appreciated. He is tolerating hyperbaric oxygen therapy. 08/26/2021: The lateral foot wound has closed completely. The wound in his midfoot is shallower again today. The wound orifice is contracting. We are using gentamicin and silver alginate. He continues on IV daptomycin and cefepime and is tolerating hyperbaric oxygen therapy without difficulty. 09/02/2021: His foot is a little bit sore today but he was up walking on it all weekend doing back to school shopping with his daughter. The tunneling is down to about 3 cm and I do not appreciate bone at the tip of the probe. The wound is clean but has  some callus creating an overhanging lip at the distal aspect. He continues to receive hyperbaric oxygen therapy as well as IV daptomycin and cefepime. 09/09/2021: His wound continues to improve. The tunnel has come in by 0.9 cm. He continues to form callus around the orifice of the wound. He is tolerating hyperbaric oxygen therapy along with his IV antibiotics. 09/16/2021: The tunneling is down to just half a centimeter. He continues to build up callus around the orifice of the wound. He completed his course of IV antibiotics and his PICC line is scheduled to be removed this Friday. He is tolerating hyperbaric oxygen without difficulty. We have been applying topical gentamicin and silver alginate to his wound. 09/24/2021: The wound depth has come in even further. There is a little bit of callus buildup around the orifice. The opening is quite narrow, at this point. 09/30/2021: The wound depth is about the same this week. The opening continues to contract with callus accumulation. There is some discoloration of the skin on the lateral part of his foot and he admitted to walking more than usual this weekend and also wearing a different pair shoes. He continues to tolerate hyperbaric oxygen therapy without difficulty. 10/07/2021: The wound depth has contracted a bit and is now approximately 1 to 1.5 cm. The orifice of the wound continues to try and close in over the space. There is just some callus and skin around the margins obscuring the orifice. 10/14/2021: The wound depth has come in a little bit more. The orifice of the wound is rolling inwards with epithelium and callus, making it difficult to pack the wound. [name], Stavros [name] ([id number]) [id number] Page 8 of 11 10/21/2021: There is still 1 portion of the wound, at the lateral aspect, that is still a couple of centimeters deep. The architecture of the wound makes this somewhat challenging to access and pack. Everything else  looks clean. He has his usual accumulation of callus and skin, narrowing the orifice. 10/29/2021: No significant change to the depth of the wound. The orifice continues to contract with callus and skin narrowing the opening. His wife has been packing the wound and doing an excellent job. 11/05/2021: The depth of the wound has come in by about a centimeter. There is less callus accumulation around the orificeoodespite this, the wound is narrower today. We are still awaiting insurance approval for  Kerecis powder. 11/12/2021: The depth of the wound continues to contract. He has accumulated some callus around the orifice, as usual. We have received a verbal confirmation that he is approved for Kerecis, but no formal paperwork has yet been received. 11/19/2021: The depth continues to come in. There is callus accumulation around the orifice, as usual. He has been formally approved for Kerecis, but unfortunately we do not have a billing mechanism for the powder in [facility] yet. We are working to address this so we can begin treatment. 11/25/2021: Continued filling in of the depth of the wound. Continued buildup of callus around the orifice. No concern for infection. As we do not have a way to bill for Kerecis powder, but are capable of billing for the [facility] sheet, we are going to use that on him instead. Patient History Information obtained from Patient. Social History Never smoker, Marital Status - Married, Alcohol Use - Never, Drug Use - No History, Caffeine Use - Daily - T coffee. ea; Medical History Endocrine Patient has history of Type II Diabetes Hospitalization/Surgery History - Amuptation of 3rd,4th and 5th toes of Right foot;Oral Surgery;Anal Fissure surgery; Cholecystectomy. Objective Constitutional Slightly hypertensive. No acute distress.. Vitals Time Taken: 7:49 AM, Height: 72 in, Weight: 312 lbs, BMI: 42.3, Temperature: 98.7 F, Pulse: 81 bpm, Respiratory Rate: 18 breaths/min, Blood  Pressure: 149/88 mmHg, Capillary Blood Glucose: 213 mg/dl. Respiratory Normal work of breathing on room air.. General Notes: 11/25/2021: The wound is a little bit shallower again. Continued callus accumulation at the wound orifice. Integumentary (Hair, Skin) Wound #2 status is Open. Original cause of wound was Gradually Appeared. The date acquired was: 12/19/2016. The wound has been in treatment 20 weeks. The wound is located on the [place]. The wound measures 0.2cm length x 1.3cm width x 1cm depth; 0.204cm^2 area and 0.204cm^3 volume. There is Fat Layer (Subcutaneous Tissue) exposed. There is no tunneling noted, however, there is undermining starting at 12:00 and ending at 12:00 with a maximum distance of 0.5cm. There is a medium amount of serosanguineous drainage noted. The wound margin is thickened. There is large (67-100%) red granulation within the wound bed. There is a small (1-33%) amount of necrotic tissue within the wound bed including Adherent Slough. The periwound skin appearance had no abnormalities noted for moisture. The periwound skin appearance had no abnormalities noted for color. The periwound skin appearance exhibited: Callus. Periwound temperature was noted as No Abnormality. 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 There was a Selective/Open Wound Skin/Epidermis Open Wound/Selective [phone]) with a total area of 0.65 sq cm performed by [name], MD. With the following instrument(s): Curette to remove Non-Viable tissue/material. Material removed includes  Callus and [name], [name] [name] ([id number]) [id number] Page 9 of 11 Skin: Epidermis and after achieving pain control using Lidocaine 4% T opical Solution. No specimens were taken. A time out was conducted at 08:10, prior to the start of the procedure. A Minimum amount of bleeding was controlled with Other T opical Anticoagulant. The procedure was tolerated well with a pain level of 0 throughout and a pain level of 0 following the procedure. Post Debridement Measurements: 0.4cm length x 1.3cm width x  1cm depth; 0.408cm^3 volume. Character of Wound/Ulcer Post Debridement is improved. Severity of Tissue Post Debridement is: Fat layer exposed. Post procedure Diagnosis Wound #2: Same as Pre-Procedure Pre-procedure diagnosis of Wound #2 is a Diabetic Wound/Ulcer of the Lower Extremity located on the Right,Lateral,Plantar Foot. A skin graft procedure using a bioengineered skin substitute/cellular or tissue based product was performed by [name], MD with the following instrument(s): Forceps and Scissors. Kerecis Omega3 was applied and secured with Steri-Strips. 6 sq cm of product was utilized and 15 sq cm was wasted due to wound size. Post Application, adaptic, gaize was applied. A Time Out was conducted at 08:10, prior to the start of the procedure. The procedure was tolerated well with a pain level of 0 throughout and a pain level of 0 following the procedure. Post procedure Diagnosis Wound #2: Same as Pre-Procedure . Plan Follow-up Appointments: Return Appointment in 1 week. - Dr Celine Ahr Room 2 Anesthetic: Wound #2 Right,Lateral,Plantar Foot: (In clinic) Topical Lidocaine 4% applied to wound bed Cellular or Tissue Based Products: Cellular or Tissue Based Product Type: - Kerecis #1 Cellular or Tissue Based Product applied to wound bed, secured with steri-strips, cover with Adaptic or Mepitel. (DO NOT REMOVE). Bathing/ Shower/ Hygiene: May shower with protection but  do not get wound dressing(s) wet. Off-Loading: Other: - Try to not stand on your feet too much Hyperbaric Oxygen Therapy: Evaluate for HBO Therapy Indication: - wagner grade 3 diabetic foot ulcer right foot 2.0 ATA for 90 Minutes without Air Breaks T Number of Treatments: - 40 more treatments for a total of 80 09/30/21 otal One treatments per day (delivered Monday through Friday unless otherwise specified in Special Instructions below): Finger stick Blood Glucose Pre- and Post- HBOT Treatment. Follow Hyperbaric Oxygen Glycemia Protocol Afrin (Oxymetazoline HCL) 0.05% nasal spray - 1 spray in both nostrils daily as needed prior to HBO treatment for difficulty clearing ears WOUND #2: - Foot Wound Laterality: Plantar, Right, Lateral Cleanser: Soap and Water Every Other Day/15 Days Discharge Instructions: May shower and wash wound with dial antibacterial soap and water prior to dressing change. Cleanser: Wound Cleanser (Generic) Every Other Day/15 Days Discharge Instructions: Cleanse the wound with wound cleanser prior to applying a clean dressing using gauze sponges, not tissue or cotton balls. Peri-Wound Care: cotton tipped applicators (Generic) Every Other Day/15 Days Prim Dressing: kerecis Every Other Day/15 Days ary Discharge Instructions: do not remove Secondary Dressing: Woven Gauze Sponge, Non-Sterile 4x4 in Every Other Day/15 Days Discharge Instructions: Apply over primary dressing as directed. Secured With: Paper T ape, 2x10 (in/yd) (Generic) Every Other Day/15 Days Discharge Instructions: Secure dressing with tape as directed. Com pression Wrap: Kerlix Roll 4.5x3.1 (in/yd) (Generic) Every Other Day/15 Days Discharge Instructions: Apply Kerlix and Coban compression as directed. 11/25/2021: The wound is a little bit shallower again. Continued callus accumulation at the wound orifice. I used a curette to debride callus and skin from around the wound orifice. Hemostasis was achieved  with silver nitrate. I then cut the Kerecis sheet to an appropriate size. It was moistened with saline and packed into the wound. It was secured in place with Adaptic and Steri-Strips. A gauze sponge was used as a bolster. He will continue his hyperbaric oxygen therapy. Follow-up in 1 week. Electronic Signature(s) Signed: 11/25/2021 8:40:57 AM By: [name] MD FACS Entered By: [name] on 11/25/2021 08:40:57 -------------------------------------------------------------------------------- HxROS Details Patient Name: Date of Service: [name], [name] 11/25/2021 7:30 [name] Medical Record Number: [id number] Patient  Account Number: [id number] Date of Birth/Sex: Treating RN: 1970/12/27 (51 y.o. [name] Primary Care Provider: [name] Other Clinician: [name], Billey [name] ([id number]) [id number] Page 10 of 11 Referring Provider: Treating Provider/Extender: [name] in Treatment: 20 Information Obtained From Patient Endocrine Medical History: Positive for: Type II Diabetes Immunizations Pneumococcal Vaccine: Received Pneumococcal Vaccination: Yes Received Pneumococcal Vaccination On or After 60th Birthday: No Implantable Devices None Hospitalization / Surgery History Type of Hospitalization/Surgery Amuptation of 3rd,4th and 5th toes of Right foot;Oral Surgery;Anal Fissure surgery; Cholecystectomy Family and Social History Never smoker; Marital Status - Married; Alcohol Use: Never; Drug Use: No History; Caffeine Use: Daily - T coffee; Financial Concerns: No; Food, ea; Clothing or Shelter Needs: No; Support System Lacking: No; Transportation Concerns: No Electronic Signature(s) Signed: 11/25/2021 12:07:14 PM By: [name] MD FACS Entered By: [name] on 11/25/2021 08:38:30 -------------------------------------------------------------------------------- SuperBill Details Patient Name: Date of Service: SPA  [place], [name] 11/25/2021 Medical Record Number: [id number] Patient Account Number: [id number] Date of Birth/Sex: Treating RN: November 20, 1970 (51 y.o. [name] Primary Care Provider: [name] Other Clinician: Referring Provider: Treating Provider/Extender: [name] in Treatment: 20 Diagnosis Coding ICD-10 Codes Code Description E11.49 Type 2 diabetes mellitus with other diabetic neurological complication G18.299 Other chronic osteomyelitis, right ankle and foot L97.514 Non-pressure chronic ulcer of other part of right foot with necrosis of bone Facility Procedures : CPT4 Code: 37169678 Description: Uvalda Marigen 1.75x1.75 - 4qty Modifier: Quantity: 21 : CPT4 Code: 93810175 Description: 10258 - SKIN SUB GRAFT FACE/NK/HF/G ICD-10 Diagnosis Description L97.514 Non-pressure chronic ulcer of other part of right foot with necrosis of bone Modifier: Quantity: 1 Physician Procedures : CPT4 Code Description Modifier [id number] 23536 - WC PHYS LEVEL 4 - EST PT 25 ICD-10 Diagnosis Description [name], Bretton [name] ([id number]) [id number] L97.514 Non-pressure chronic ulcer of other part of right foot with necrosis of bone  E11.49 Type 2 diabetes mellitus with other diabetic neurological complication Q67.619 Other chronic osteomyelitis, right ankle and foot Quantity: 1 1227.pdf Page 11 of 11 : [id number] 12458 - WC PHYS SKIN SUB GRAFT FACE/NK/HF/G ICD-10 Diagnosis Description L97.514 Non-pressure chronic ulcer of other part of right foot with necrosis of bone Quantity: 1 Electronic Signature(s) Signed: 11/25/2021 8:41:17 AM By: [name] MD FACS Entered By: [name] on 11/25/2021 08:41:17

## 2021-11-25 NOTE — Progress Notes (Signed)
[name], [name] ([id number]) [id number] Page 1 of 2 Visit Report for 11/25/2021 Problem List Details Patient Name: Date of Service: [name], [name] 11/25/2021 8:30 [name] Medical Record Number: [id number] Patient Account Number: [id number] Date of Birth/Sex: Treating RN: [birth year] (51 y.o. [name] Primary Care Provider: [name] Other Clinician: [name] Referring Provider: Treating Provider/Extender: [name] in Treatment: 20 Active Problems ICD-10 Encounter Code Description Active Date MDM Diagnosis E11.49 Type 2 diabetes mellitus with other diabetic neurological 7/40/8144 No Yes complication Y18.563 Other chronic osteomyelitis, right ankle and foot 07/04/2021 No Yes L97.514 Non-pressure chronic ulcer of other part of right foot with 07/04/2021 No Yes necrosis of bone Inactive Problems Resolved Problems Electronic Signature(s) Signed: 11/25/2021 2:34:11 PM By: [name] EMT Signed: 11/25/2021 4:03:30 PM By: [name] MD FACS Entered By: [name] on 11/25/2021 14:34:11 -------------------------------------------------------------------------------- SuperBill Details Patient Name: Date of Service: [name], [name] 11/25/2021 Medical Record Number: [id number] Patient Account Number: [id number] Date of Birth/Sex: Treating RN: 07/01/1970 (51 y.o. [name] Primary Care Provider: [name] Other Clinician: [name] Referring Provider: Treating Provider/Extender: [name] in Treatment: 20 Diagnosis Coding ICD-10 Codes Code Description E11.49 Type 2 diabetes mellitus with other diabetic neurological complication C58.850 Other chronic osteomyelitis, right ankle and foot L97.514 Non-pressure chronic ulcer of other part of right foot with necrosis of bone [name], [name] ([id number]) [id number] Page 2 of  2 Facility Procedures : CPT4 Code Description: [id number] G0277-(Facility Use Only) HBOT full body chamber, 70mn , ICD-10 Diagnosis Description E11.49 Type 2 diabetes mellitus with other diabetic neurolog L97.514 Non-pressure chronic ulcer of other part of right foo M86.671  Other chronic osteomyelitis, right ankle and foot Modifier: ical complication t with necrosis o Quantity: 4 f bone Physician Procedures : CPT4 Code Description Modifier [id number] 66294- WC PHYS HYPERBARIC OXYGEN THERAPY ICD-10 Diagnosis Description E11.49 Type 2 diabetes mellitus with other diabetic neurological complication LT65.465Non-pressure chronic ulcer of other part of right foot  with necrosis o M86.671 Other chronic osteomyelitis, right ankle and foot Quantity: 1 f bone Electronic Signature(s) Signed: 11/25/2021 2:34:07 PM By: GValeria BatmanEMT Signed: 11/25/2021 4:03:30 PM By: CFredirick MaudlinMD FACS Entered By: GValeria Batmanon 11/25/2021 14:34:06

## 2021-11-25 NOTE — Progress Notes (Addendum)
[name], Sh[facility] ([id number]) [id number] Page 1 of 2 Visit Report for 11/25/2021 HBO Details Patient Name: Date of Service: [name], [name] 11/25/2021 8:30 A M Medical Record Number: [id number] Patient Account Number: [id number] Date of Birth/Sex: Treating RN: 15-May-1970 [name] Primary Care [place]: [name] Other Clinician: [name] Referring Trevel Dillenbeck: Treating Haruka Kowaleski/Extender: [name] in Treatment: 20 HBO Treatment Course Details Treatment Course Number: 1 Ordering Demaree Liberto: [name] T Treatments Ordered: otal 80 HBO Treatment Start Date: 08/14/2021 HBO Indication: Diabetic Ulcer(s) of the Lower Extremity Standard/Conservative Wound Care tried and failed greater than or equal to 30 days HBO Treatment Details Treatment Number: 71 Patient Type: Outpatient Chamber Type: Monoplace Chamber Serial #: [id number] Treatment Protocol: 2.0 ATA with 90 minutes oxygen, and no air breaks Treatment Details Compression Rate Down: 2.0 psi / minute De-Compression Rate Up: 2.0 psi / minute Air breaks and breathing Decompress Decompress Compress Tx Pressure Begins Reached periods Begins Ends (leave unused spaces blank) Chamber Pressure (ATA 1 2 ------2 1 ) Clock Time (24 hr) 08:44 08:53 - - - - - - 10:23 10:31 Treatment Length: 107 (minutes) Treatment Segments: 4 Vital Signs Capillary Blood Glucose Reference Range: 80 - 120 mg / dl HBO Diabetic Blood Glucose Intervention Range: <131 mg/dl or >249 mg/dl Time Vitals Blood Respiratory Capillary Blood Glucose Pulse Action Type: Pulse: Temperature: Taken: Pressure: Rate: Glucose (mg/dl): Meter #: Oximetry (%) Taken: Pre 08:40 128/77 86 16 97.9 208 Post 10:35 137/91 73 16 97.5 152 Treatment Response Treatment Toleration: Well Treatment Completion Status: Treatment Completed without Adverse Event Additional Procedure Documentation Tissue  Sevierity: Necrosis of bone Physician HBO Attestation: I certify that I supervised this HBO treatment in accordance with Medicare guidelines. A trained emergency response team is readily available per Yes hospital policies and procedures. Continue HBOT as ordered. Yes Electronic Signature(s) Signed: 11/25/2021 4:04:24 PM By: [name] MD FACS Previous Signature: 11/25/2021 2:31:13 PM Version By: [name] EMT Entered By: [name] on 11/25/2021 16:04:23 [name], [name] ([id number]) [id number] Page 2 of 2 -------------------------------------------------------------------------------- HBO Safety Checklist Details Patient Name: Date of Service: [name], [name] 11/25/2021 8:30 A M Medical Record Number: [id number] Patient Account Number: [id number] Date of Birth/Sex: Treating RN: [birth year] [name] Primary Care [name]: [name] Other Clinician: [name] Referring [name]: Treating [name]/Extender: [name] in Treatment: 20 HBO Safety Checklist Items Safety Checklist Consent Form Signed Patient voided / foley secured and emptied When did you last eato 0650 Last dose of injectable or oral agent 0650 Ostomy pouch emptied and vented if applicable NA All implantable devices assessed, documented and approved NA Intravenous access site secured and place NA Valuables secured Linens and cotton and cotton/polyester blend (less than 51% polyester) Personal oil-based products / skin lotions / body lotions removed Wigs or hairpieces removed NA Smoking or tobacco materials removed Books / newspapers / magazines / loose paper removed Cologne, aftershave, perfume and deodorant removed Jewelry removed (may wrap wedding band) Make-up removed NA Hair care products removed Battery operated devices (external) removed Heating patches and chemical warmers removed Titanium eyewear  removed NA Nail polish cured greater than 10 hours NA Casting material cured greater than 10 hours NA Hearing aids removed NA Loose dentures or partials removed NA Prosthetics have been removed NA Patient demonstrates correct use of air break device (if applicable) Patient concerns have been addressed Patient grounding bracelet on and cord attached to chamber  Specifics for Inpatients (complete in addition to above) Medication sheet sent with patient NA Intravenous medications needed or due during therapy sent with patient NA Drainage tubes (e.g. nasogastric tube or chest tube secured and vented) NA Endotracheal or Tracheotomy tube secured NA Cuff deflated of air and inflated with saline NA Airway suctioned NA Notes The safety checklist was done before the treatment was started. Electronic Signature(s) Signed: 11/25/2021 2:29:53 PM By: [name] EMT Entered By: [name] on 11/25/2021 14:29:53

## 2021-11-26 ENCOUNTER — Encounter (HOSPITAL_BASED_OUTPATIENT_CLINIC_OR_DEPARTMENT_OTHER): Payer: BLUE CROSS/BLUE SHIELD | Admitting: General Surgery

## 2021-11-26 DIAGNOSIS — E1149 Type 2 diabetes mellitus with other diabetic neurological complication: Secondary | ICD-10-CM | POA: Diagnosis not present

## 2021-11-26 LAB — GLUCOSE, CAPILLARY
Glucose-Capillary: 139 mg/dL — ABNORMAL HIGH (ref 70–99)
Glucose-Capillary: 136 mg/dL — ABNORMAL HIGH (ref 70–99)

## 2021-11-26 NOTE — Progress Notes (Signed)
[name], [name] ([id number]) [id number] Page 1 of 2 Visit Report for 11/26/2021 Problem List Details Patient Name: Date of Service: [name], [name] 11/26/2021 8:00 [name] Medical Record Number: [id number] Patient Account Number: [id number] Date of Birth/Sex: Treating RN: 11-Sep-1970 (51 y.o. 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 11/26/2021 Medical Record Number: [id number] Patient Account Number: [id number] Date of Birth/Sex: Treating RN: 08/18/1970 (51 y.o. 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 V89.381 Other chronic osteomyelitis, right ankle and foot L97.514 Non-pressure chronic ulcer of other part of right foot with necrosis of bone [name], [name] ([id number]) [id number] Page 2 of  2 Facility Procedures : CPT4 Code Description: 52778242 G0277-(Facility Use Only) HBOT full body chamber, 16mn , ICD-10 Diagnosis Description 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 M86.671 Other chronic osteomyelitis, right ankle and foot L97.514  Non-pressure chronic ulcer of other part of right foo Modifier: ical complication t with necrosis o Quantity: 4 f bone Physician Procedures : CPT4 Code Description Modifier [id number] 31540- WC PHYS HYPERBARIC OXYGEN THERAPY ICD-10 Diagnosis Description 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

## 2021-11-26 NOTE — Progress Notes (Addendum)
[name] [name], [name] [name] ([id number]) [id number] Page 1 of 2 Visit Report for 11/26/2021 HBO Details Patient Name: Date of Service: [name], [name] [name] 11/26/2021 8:00 A [name] Medical Record Number: [id number] Patient Account Number: [id number] Date of Birth/Sex: Treating RN: [birth year] (51 y.o. [name] Li Fragoso: [name] Other Clinician: [name] Referring Malasia Torain: Treating Hutchinson Isenberg/Extender: [name] in Treatment: 20 HBO Treatment Course Details Treatment Course Number: 1 Ordering Sherlie Boyum: [name] T Treatments Ordered: otal 80 HBO Treatment Start Date: 08/14/2021 HBO Indication: Diabetic Ulcer(s) of the Lower Extremity Standard/Conservative Wound Care tried and failed greater than or equal to 30 days HBO Treatment Details Treatment Number: 72 Patient Type: Outpatient Chamber Type: Monoplace Chamber Serial #: [id number] Treatment Protocol: 2.0 ATA with 90 minutes oxygen, and no air breaks Treatment Details Compression Rate Down: 2.0 psi / minute De-Compression Rate Up: 2.0 psi / minute Air breaks and breathing Decompress Decompress Compress Tx Pressure Begins Reached periods Begins Ends (leave unused spaces blank) Chamber Pressure (ATA 1 2 ------2 1 ) Clock Time (24 hr) 08:19 08:27 - - - - - - 09:57 10:05 Treatment Length: 106 (minutes) Treatment Segments: 4 Vital Signs Capillary Blood Glucose Reference Range: 80 - 120 mg / dl HBO Diabetic Blood Glucose Intervention Range: <131 mg/dl or >249 mg/dl Time Vitals Blood Respiratory Capillary Blood Glucose Pulse Action Type: Pulse: Temperature: Taken: Pressure: Rate: Glucose (mg/dl): Meter #: Oximetry (%) Taken: Pre 08:12 114/72 82 18 98.2 139 Post 10:10 159/87 76 16 98.2 136 Treatment Response Treatment Toleration: Well Treatment Completion Status: Treatment Completed without Adverse Event Additional Procedure Documentation Tissue  Sevierity: Necrosis of bone Physician HBO Attestation: I certify that I supervised this HBO treatment in accordance with Medicare guidelines. A trained emergency response team is readily available per Yes hospital policies and procedures. Continue HBOT as ordered. Yes Electronic Signature(s) Signed: 11/26/2021 11:24:18 AM By: [name] MD FACS Previous Signature: 11/26/2021 11:06:45 AM Version By: [name] EMT Entered By: [name] on 11/26/2021 11:24:18 [name] [name], [name] [name] ([id number]) [id number] Page 2 of 2 -------------------------------------------------------------------------------- HBO Safety Checklist Details Patient Name: Date of Service: [name], [name] [name] 11/26/2021 8:00 A [name] Medical Record Number: [id number] Patient Account Number: [id number] Date of Birth/Sex: Treating RN: 10-10-1970 (51 y.o. [name] Kassidie Hendriks: [name] Other Clinician: [name] Referring Dahmir Epperly: Treating Carizma Dunsworth/Extender: [name] in Treatment: 20 HBO Safety Checklist Items Safety Checklist Consent Form Signed Patient voided / foley secured and emptied When did you last eato 0645 Last dose of injectable or oral agent 0655 Ostomy pouch emptied and vented if applicable NA All implantable devices assessed, documented and approved NA Intravenous access site secured and place NA Valuables secured Linens and cotton and cotton/polyester blend (less than 51% polyester) Personal oil-based products / skin lotions / body lotions removed Wigs or hairpieces removed NA Smoking or tobacco materials removed Books / newspapers / magazines / loose paper removed Cologne, aftershave, perfume and deodorant removed Jewelry removed (may wrap wedding band) Make-up removed NA Hair care products removed Battery operated devices (external) removed Heating patches and chemical warmers removed Titanium eyewear  removed NA Nail polish cured greater than 10 hours NA Casting material cured greater than 10 hours NA Hearing aids removed NA Loose dentures or partials removed NA Prosthetics have been removed NA Patient demonstrates correct use of air break device (if applicable) Patient concerns have been addressed Patient grounding bracelet on and cord attached to chamber  Specifics for Inpatients (complete in addition to above) Medication sheet sent with patient NA Intravenous medications needed or due during therapy sent with patient NA Drainage tubes (e.g. nasogastric tube or chest tube secured and vented) NA Endotracheal or Tracheotomy tube secured NA Cuff deflated of air and inflated with saline NA Airway suctioned NA Notes The safety checklist was done before the treatment was started. Electronic Signature(s) Signed: 11/26/2021 11:05:20 AM By: [name] EMT Entered By: [name] on 11/26/2021 11:05:20

## 2021-11-26 NOTE — Progress Notes (Addendum)
[name], Elyjah M ([id number]) [id number] Page 1 of 2 Visit Report for 11/26/2021 Arrival Information Details Patient Name: Date of Service: [place], [place] [name] 11/26/2021 8:00 A M Medical Record Number: [id number] Patient Account Number: [id number] Date of Birth/Sex: Treating RN: May 22, 1970 (51 y.o. [name], [place] Primary Care Kinzie Wickes: [name] Other Clinician: [name] Referring Deashia Soule: Treating Reginia Battie/Extender: [name] in Treatment: 20 Visit Information History Since Last Visit All ordered tests and consults were completed: Yes Patient Arrived: Ambulatory Added or deleted any medications: No Arrival Time: 07:45 Any new allergies or adverse reactions: No Accompanied By: None Had a fall or experienced change in No Transfer Assistance: None activities of daily living that may affect Patient Identification Verified: Yes risk of falls: Secondary Verification Process Completed: Yes Signs or symptoms of abuse/neglect since last visito No Patient Requires Transmission-Based Precautions: No Hospitalized since last visit: No Patient Has Alerts: Yes Implantable device outside of the clinic excluding No cellular tissue based products placed in the center since last visit: Pain Present Now: No Electronic Signature(s) Signed: 11/26/2021 11:01:07 AM By: [name] EMT Entered By: [name] on 11/26/2021 11:01:07 -------------------------------------------------------------------------------- Encounter Discharge Information Details Patient Name: Date of Service: [place], [name] 11/26/2021 8:00 A M Medical Record Number: [id number] Patient Account Number: [id number] Date of Birth/Sex: Treating RN: 04-14-1970 (51 y.o. [name] Brevan Luberto: [name] Other Clinician: [name] Referring Keatyn Luck: Treating Kortlynn Poust/Extender: [name] in Treatment:  20 Encounter Discharge Information Items Discharge Condition: Stable Ambulatory Status: Ambulatory Discharge Destination: Home Transportation: Private Auto Accompanied By: None Schedule Follow-up Appointment: Yes Clinical Summary of Care: Electronic Signature(s) Signed: 11/26/2021 11:19:22 AM By: [name] EMT Previous Signature: 11/26/2021 11:08:15 AM Version By: [name] EMT Entered By: [name] on 11/26/2021 11:19:21 [name], General M ([id number]) [id number] Page 2 of 2 -------------------------------------------------------------------------------- Vitals Details Patient Name: Date of Service: [name], [name] 11/26/2021 8:00 A M Medical Record Number: [id number] Patient Account Number: [id number] Date of Birth/Sex: Treating RN: 16-Aug-1970 (51 y.o. [name] Ebelyn Bohnet: [name] Other Clinician: [name] Referring Candy Ziegler: Treating Sevilla Murtagh/Extender: [name] in Treatment: 20 Vital Signs Time Taken: 08:12 Temperature (F): 98.2 Height (in): 72 Pulse (bpm): 82 Weight (lbs): 312 Respiratory Rate (breaths/min): 18 Body Mass Index (BMI): 42.3 Blood Pressure (mmHg): 114/72 Capillary Blood Glucose (mg/dl): 139 Reference Range: 80 - 120 mg / dl Electronic Signature(s) Signed: 11/26/2021 11:01:44 AM By: [name] EMT Entered By: [name] on 11/26/2021 11:01:43

## 2021-11-27 ENCOUNTER — Encounter (HOSPITAL_BASED_OUTPATIENT_CLINIC_OR_DEPARTMENT_OTHER): Payer: BLUE CROSS/BLUE SHIELD | Admitting: General Surgery

## 2021-11-27 DIAGNOSIS — E1149 Type 2 diabetes mellitus with other diabetic neurological complication: Secondary | ICD-10-CM | POA: Diagnosis not present

## 2021-11-27 LAB — GLUCOSE, CAPILLARY
Glucose-Capillary: 191 mg/dL — ABNORMAL HIGH (ref 70–99)
Glucose-Capillary: 189 mg/dL — ABNORMAL HIGH (ref 70–99)

## 2021-11-27 NOTE — Progress Notes (Signed)
[name], [name] ([id number]) [id number] Page 1 of 2 Visit Report for 11/27/2021 Arrival Information Details Patient Name: Date of Service: [name], [name] 11/27/2021 8:00 [name] Medical Record Number: [id number] Patient Account Number: [id number] Date of Birth/Sex: Treating RN: 1970/03/09 (51 y.o. [name], [name] Primary Care Rue Valladares: [name] Other Clinician: [name] Referring Audrey Thull: Treating Ester Mabe/Extender: [name] in Treatment: 20 Visit Information History Since Last Visit All ordered tests and consults were completed: Yes Patient Arrived: Ambulatory Added or deleted any medications: No Arrival Time: 07:36 Any new allergies or adverse reactions: No Accompanied By: None Had a fall or experienced change in No Transfer Assistance: None activities of daily living that may affect Patient Identification Verified: Yes risk of falls: Secondary Verification Process Completed: Yes Signs or symptoms of abuse/neglect since last visito No Patient Requires Transmission-Based Precautions: No Hospitalized since last visit: No Patient Has Alerts: Yes Implantable device outside of the clinic excluding No cellular tissue based products placed in the center since last visit: Pain Present Now: No Electronic Signature(s) Signed: 11/27/2021 1:03:53 PM By: [name] EMT Entered By: [name] on 11/27/2021 13:03:53 -------------------------------------------------------------------------------- Encounter Discharge Information Details Patient Name: Date of Service: [name], [name] 11/27/2021 8:00 [name] Medical Record Number: [id number] Patient Account Number: [id number] Date of Birth/Sex: Treating RN: 10-24-1970 (51 y.o. [name], [name] Primary Care Norene Oliveri: [name] Other Clinician: [name] Referring Yelitza Reach: Treating Kitrina Maurin/Extender: [name] in  Treatment: 20 Encounter Discharge Information Items Discharge Condition: Stable Ambulatory Status: Ambulatory Discharge Destination: Home Transportation: Private Auto Accompanied By: None Schedule Follow-up Appointment: Yes Clinical Summary of Care: Electronic Signature(s) Signed: 11/27/2021 1:19:15 PM By: [name] EMT Entered By: [name] on 11/27/2021 13:19:15 [name], [name] ([id number]) [id number] Page 2 of 2 -------------------------------------------------------------------------------- Vitals Details Patient Name: Date of Service: [name], [name] 11/27/2021 8:00 [name] Medical Record Number: [id number] Patient Account Number: [id number] Date of Birth/Sex: Treating RN: Jul 19, 1970 (51 y.o. [name], [name] Primary Care [name]: [name] Other Clinician: [name] Referring Adis Sturgill: Treating Klein Willcox/Extender: [name] in Treatment: 20 Vital Signs Time Taken: 08:10 Temperature (F): 97.9 Height (in): 72 Pulse (bpm): 86 Weight (lbs): 312 Respiratory Rate (breaths/min): 18 Body Mass Index (BMI): 42.3 Blood Pressure (mmHg): 136/67 Capillary Blood Glucose (mg/dl): 191 Reference Range: 80 - 120 mg / dl Electronic Signature(s) Signed: 11/27/2021 1:04:26 PM By: [name] EMT Entered By: [name] on 11/27/2021 13:04:26

## 2021-11-27 NOTE — Progress Notes (Signed)
[name], Rithvik M ([id number]) [id number] Page 1 of 2 Visit Report for 11/27/2021 Problem List Details Patient Name: Date of Service: [name], [name] 11/27/2021 8:00 A M Medical Record Number: [id number] Patient Account Number: [id number] Date of Birth/Sex: Treating RN: 16-Sep-1970 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 11/27/2021 Medical Record Number: [id number] Patient Account Number: [id number] Date of Birth/Sex: Treating RN: 11-02-1970 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 R41.638 Other chronic osteomyelitis, right ankle and foot L97.514 Non-pressure chronic ulcer of other part of right foot with necrosis of bone [name], [name] ([id number]) [id number] Page 2 of  2 Facility Procedures : CPT4 Code Description: [id number] G0277-(Facility Use Only) HBOT full body chamber, 62mn , ICD-10 Diagnosis Description 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 L97.514 Non-pressure chronic ulcer of other part of right foo M86.671  Other chronic osteomyelitis, right ankle and foot Modifier: ical complication t with necrosis o Quantity: 4 f bone Physician Procedures : CPT4 Code Description Modifier [id number] 88916- WC PHYS HYPERBARIC OXYGEN THERAPY ICD-10 Diagnosis Description 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

## 2021-11-27 NOTE — Progress Notes (Addendum)
[name], [name] ([id number]) [id number] Page 1 of 2 Visit Report for 11/27/2021 HBO Details Patient Name: Date of Service: [name], [name] 11/27/2021 8:00 [name] Medical Record Number: [id number] Patient Account Number: [id number] Date of Birth/Sex: Treating RN: May 10, 1970 [name], [name] Primary Care [name]: [name] Other Clinician: Valeria [name] Referring [name]: Treating [name]/Extender: [name] in Treatment: 20 HBO Treatment Course Details Treatment Course Number: 1 Ordering [name]: [name] T Treatments Ordered: otal 80 HBO Treatment Start Date: 08/14/2021 HBO Indication: Diabetic Ulcer(s) of the Lower Extremity Standard/Conservative Wound Care tried and failed greater than or equal to 30 days HBO Treatment Details Treatment Number: 73 Patient Type: Outpatient Chamber Type: Monoplace Chamber Serial #: [id number] Treatment Protocol: 2.0 ATA with 90 minutes oxygen, and no air breaks Treatment Details Compression Rate Down: 2.0 psi / minute De-Compression Rate Up: 2.0 psi / minute Air breaks and breathing Decompress Decompress Compress Tx Pressure Begins Reached periods Begins Ends (leave unused spaces blank) Chamber Pressure (ATA 1 2 ------2 1 ) Clock Time (24 hr) 08:15 08:22 - - - - - - 09:52 10:01 Treatment Length: 106 (minutes) Treatment Segments: 4 Vital Signs Capillary Blood Glucose Reference Range: 80 - 120 mg / dl HBO Diabetic Blood Glucose Intervention Range: <131 mg/dl or >249 mg/dl Time Vitals Blood Respiratory Capillary Blood Glucose Pulse Action Type: Pulse: Temperature: Taken: Pressure: Rate: Glucose (mg/dl): Meter #: Oximetry (%) Taken: Pre 08:10 136/67 86 18 97.9 191 Post 10:03 148/97 18 18 97.3 189 Treatment Response Treatment Toleration: Well Treatment Completion Status: Treatment Completed without Adverse Event Additional Procedure  Documentation Tissue Sevierity: Necrosis of bone Physician HBO Attestation: I certify that I supervised this HBO treatment in accordance with Medicare guidelines. A trained emergency response team is readily available per Yes hospital policies and procedures. Continue HBOT as ordered. Yes Electronic Signature(s) Signed: 11/27/2021 4:00:49 PM By: [name] Maudlin MD FACS Previous Signature: 11/27/2021 1:18:18 PM Version By: [name] EMT Entered By: [name] on 11/27/2021 16:00:48 [name], [name] ([id number]) [id number] Page 2 of 2 -------------------------------------------------------------------------------- HBO Safety Checklist Details Patient Name: Date of Service: [name], [name] 11/27/2021 8:00 [name] Medical Record Number: [id number] Patient Account Number: [id number] Date of Birth/Sex: Treating RN: 1970-04-30 [name], [name] Primary Care [name]: [name] Other Clinician: Valeria [name] Referring [name]: Treating [name]/Extender: [name] in Treatment: 20 HBO Safety Checklist Items Safety Checklist Consent Form Signed Patient voided / foley secured and emptied When did you last eato 0645 Last dose of injectable or oral agent 0650 Ostomy pouch emptied and vented if applicable NA All implantable devices assessed, documented and approved NA Intravenous access site secured and place NA Valuables secured Linens and cotton and cotton/polyester blend (less than 51% polyester) Personal oil-based products / skin lotions / body lotions removed Wigs or hairpieces removed NA Smoking or tobacco materials removed Books / newspapers / magazines / loose paper removed Cologne, aftershave, perfume and deodorant removed Jewelry removed (may wrap wedding band) Make-up removed NA Hair care products removed Battery operated devices (external) removed Heating patches and chemical warmers  removed Titanium eyewear removed NA Nail polish cured greater than 10 hours NA Casting material cured greater than 10 hours NA Hearing aids removed NA Loose dentures or partials removed NA Prosthetics have been removed NA Patient demonstrates correct use of air break device (if applicable) Patient concerns have been addressed Patient grounding bracelet on and cord attached to chamber  Specifics for Inpatients (complete in addition to above) Medication sheet sent with patient NA Intravenous medications needed or due during therapy sent with patient NA Drainage tubes (e.g. nasogastric tube or chest tube secured and vented) NA Endotracheal or Tracheotomy tube secured NA Cuff deflated of air and inflated with saline NA Airway suctioned NA Notes The safety checklist was done before the treatment was started. Electronic Signature(s) Signed: 11/27/2021 1:05:18 PM By: [name] EMT Entered By: [name] on 11/27/2021 13:05:18

## 2021-11-28 ENCOUNTER — Encounter (HOSPITAL_BASED_OUTPATIENT_CLINIC_OR_DEPARTMENT_OTHER): Payer: BLUE CROSS/BLUE SHIELD | Admitting: General Surgery

## 2021-11-28 DIAGNOSIS — E1149 Type 2 diabetes mellitus with other diabetic neurological complication: Secondary | ICD-10-CM | POA: Diagnosis not present

## 2021-11-28 LAB — GLUCOSE, CAPILLARY
Glucose-Capillary: 147 mg/dL — ABNORMAL HIGH (ref 70–99)
Glucose-Capillary: 162 mg/dL — ABNORMAL HIGH (ref 70–99)

## 2021-11-28 NOTE — Progress Notes (Signed)
[name], [name] ([id number]) [id number] Page 1 of 2 Visit Report for 11/28/2021 Problem List Details Patient Name: Date of Service: [name], [name] 11/28/2021 8:00 [name] Medical Record Number: [id number] Patient Account Number: [id number] Date of Birth/Sex: Treating RN: 16-Oct-1970 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 11/28/2021 Medical Record Number: [id number] Patient Account Number: [id number] Date of Birth/Sex: Treating RN: 1970-03-27 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 Q33.007 Other chronic osteomyelitis, right ankle and foot L97.514 Non-pressure chronic ulcer of other part of right foot with necrosis of bone [name], [name] ([id number]) [id number] Page 2 of  2 Facility Procedures : CPT4 Code Description: [id number] G0277-(Facility Use Only) HBOT full body chamber, 74mn , ICD-10 Diagnosis Description E11.49 Type 2 diabetes mellitus with other diabetic neurolog L97.514 Non-pressure chronic ulcer of other part of right foo M86.671  Other chronic osteomyelitis, right ankle and foot Modifier: ical complication t with necrosis o Quantity: 4 f bone Physician Procedures : CPT4 Code Description Modifier [id number] 68115- WC PHYS HYPERBARIC OXYGEN THERAPY ICD-10 Diagnosis Description 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

## 2021-11-28 NOTE — Progress Notes (Signed)
[name], [name] ([id number]) [id number] Page 1 of 2 Visit Report for 11/28/2021 Arrival Information Details Patient Name: Date of Service: [name], [name] 11/28/2021 8:00 [name] Medical Record Number: [id number] Patient Account Number: [id number] Date of Birth/Sex: Treating RN: 03-05-1970 [name], [name] Primary Care Nazire Fruth: [name] Other Clinician: [name] Referring Dickie Labarre: Treating Anokhi Shannon/Extender: [name] in Treatment: 21 Visit Information History Since Last Visit All ordered tests and consults were completed: Yes Patient Arrived: Ambulatory Added or deleted any medications: No Arrival Time: 07:47 Any new allergies or adverse reactions: No Accompanied By: None Had a fall or experienced change in No Transfer Assistance: None activities of daily living that may affect Patient Identification Verified: Yes risk of falls: Secondary Verification Process Completed: Yes Signs or symptoms of abuse/neglect since last visito No Patient Requires Transmission-Based Precautions: No Hospitalized since last visit: No Patient Has Alerts: Yes Implantable device outside of the clinic excluding No cellular tissue based products placed in the center since last visit: Pain Present Now: No Electronic Signature(s) Signed: 11/28/2021 2:26:15 PM By: [name] EMT Entered By: [name] on 11/28/2021 14:26:15 -------------------------------------------------------------------------------- Encounter Discharge Information Details Patient Name: Date of Service: [name], [name] 11/28/2021 8:00 [name] Medical Record Number: [id number] Patient Account Number: [id number] Date of Birth/Sex: Treating RN: 01-25-1970 [name] Primary Care Solymar Grace: [name] Other Clinician: [name] Referring Allyssia Skluzacek: Treating Rajiv Parlato/Extender: [name] in  Treatment: 21 Encounter Discharge Information Items Discharge Condition: Stable Ambulatory Status: Ambulatory Discharge Destination: Home Transportation: Private Auto Accompanied By: None Schedule Follow-up Appointment: Yes Clinical Summary of Care: Electronic Signature(s) Signed: 11/28/2021 2:31:42 PM By: [name] EMT Entered By: [name] on 11/28/2021 14:31:42 [name], [name] ([id number]) [id number] Page 2 of 2 -------------------------------------------------------------------------------- Vitals Details Patient Name: Date of Service: [name], [name] 11/28/2021 8:00 [name] Medical Record Number: [id number] Patient Account Number: [id number] Date of Birth/Sex: Treating RN: 05-08-1970 [name] Primary Care Anu Stagner: [name] Other Clinician: [name] Referring Desiree Fleming: Treating Shrika Milos/Extender: [name] in Treatment: 21 Vital Signs Time Taken: 08:00 Temperature (F): 97.6 Height (in): 72 Pulse (bpm): 85 Weight (lbs): 312 Respiratory Rate (breaths/min): 18 Body Mass Index (BMI): 42.3 Blood Pressure (mmHg): 120/74 Capillary Blood Glucose (mg/dl): 147 Reference Range: 80 - 120 mg / dl Electronic Signature(s) Signed: 11/28/2021 2:27:03 PM By: [name] EMT Entered By: [name] on 11/28/2021 14:27:02

## 2021-11-28 NOTE — Progress Notes (Signed)
[name], [name] ([id number]) [id number] Page 1 of 2 Visit Report for 11/28/2021 HBO Details Patient Name: Date of Service: [name], [name] 11/28/2021 8:00 A M Medical Record Number: [id number] Patient Account Number: [id number] Date of Birth/Sex: Treating RN: 1970-03-11 (51 y.o. [name] Primary Care [name]: [name] Other Clinician: [name] Referring [name]: Treating [name] in Treatment: 21 HBO Treatment Course Details Treatment Course Number: 1 Ordering [name]: [name] T Treatments Ordered: otal 80 HBO Treatment Start Date: 08/14/2021 HBO Indication: Diabetic Ulcer(s) of the Lower Extremity Standard/Conservative Wound Care tried and failed greater than or equal to 30 days HBO Treatment Details Treatment Number: 74 Patient Type: Outpatient Chamber Type: Monoplace Chamber Serial #: [id number] Treatment Protocol: 2.0 ATA with 90 minutes oxygen, and no air breaks Treatment Details Compression Rate Down: 2.0 psi / minute De-Compression Rate Up: 2.0 psi / minute Air breaks and breathing Decompress Decompress Compress Tx Pressure Begins Reached periods Begins Ends (leave unused spaces blank) Chamber Pressure (ATA 1 2 ------2 1 ) Clock Time (24 hr) 08:11 08:19 - - - - - - 09:49 09:57 Treatment Length: 106 (minutes) Treatment Segments: 4 Vital Signs Capillary Blood Glucose Reference Range: 80 - 120 mg / dl HBO Diabetic Blood Glucose Intervention Range: <131 mg/dl or >249 mg/dl Time Vitals Blood Respiratory Capillary Blood Glucose Pulse Action Type: Pulse: Temperature: Taken: Pressure: Rate: Glucose (mg/dl): Meter #: Oximetry (%) Taken: Pre 08:00 120/74 85 18 97.6 147 Post 10:09 134/85 70 16 98.1 162 Treatment Response Treatment Toleration: Well Treatment Completion Status: Treatment Completed without Adverse Event Additional Procedure Documentation Tissue  Sevierity: Necrosis of bone Physician HBO Attestation: I certify that I supervised this HBO treatment in accordance with Medicare guidelines. A trained emergency response team is readily available per Yes hospital policies and procedures. Continue HBOT as ordered. Yes Electronic Signature(s) Signed: 11/28/2021 3:10:57 PM By: [name] MD FACS Previous Signature: 11/28/2021 2:30:23 PM Version By: [name] EMT Entered By: [name] on 11/28/2021 15:10:57 [name], [name] ([id number]) [id number] Page 2 of 2 -------------------------------------------------------------------------------- HBO Safety Checklist Details Patient Name: Date of Service: [name], [name] 11/28/2021 8:00 A M Medical Record Number: [id number] Patient Account Number: [id number] Date of Birth/Sex: Treating RN: 1970/11/06 (51 y.o. [name] Primary Care [name]: [name] Other Clinician: [name] Referring [name]: Treating [name] in Treatment: 21 HBO Safety Checklist Items Safety Checklist Consent Form Signed Patient voided / foley secured and emptied When did you last eato 0635 Last dose of injectable or oral agent 0645 Ostomy pouch emptied and vented if applicable NA All implantable devices assessed, documented and approved NA Intravenous access site secured and place NA Valuables secured Linens and cotton and cotton/polyester blend (less than 51% polyester) Personal oil-based products / skin lotions / body lotions removed Wigs or hairpieces removed NA Smoking or tobacco materials removed Books / newspapers / magazines / loose paper removed Cologne, aftershave, perfume and deodorant removed Jewelry removed (may wrap wedding band) Make-up removed NA Hair care products removed Battery operated devices (external) removed Heating patches and chemical warmers removed Titanium eyewear  removed NA Nail polish cured greater than 10 hours NA Casting material cured greater than 10 hours NA Hearing aids removed NA Loose dentures or partials removed NA Prosthetics have been removed NA Patient demonstrates correct use of air break device (if applicable) Patient concerns have been addressed Patient grounding bracelet on and cord attached to chamber  Specifics for Inpatients (complete in addition to above) Medication sheet sent with patient NA Intravenous medications needed or due during therapy sent with patient NA Drainage tubes (e.g. nasogastric tube or chest tube secured and vented) NA Endotracheal or Tracheotomy tube secured NA Cuff deflated of air and inflated with saline NA Airway suctioned NA Notes The safety checklist was done before the treatment was started. Electronic Signature(s) Signed: 11/28/2021 2:28:05 PM By: [name] EMT Entered By: [name] on 11/28/2021 14:28:04

## 2021-11-29 ENCOUNTER — Encounter (HOSPITAL_BASED_OUTPATIENT_CLINIC_OR_DEPARTMENT_OTHER): Payer: BLUE CROSS/BLUE SHIELD | Admitting: Internal Medicine

## 2021-11-29 DIAGNOSIS — L97514 Non-pressure chronic ulcer of other part of right foot with necrosis of bone: Secondary | ICD-10-CM

## 2021-11-29 DIAGNOSIS — M86671 Other chronic osteomyelitis, right ankle and foot: Secondary | ICD-10-CM

## 2021-11-29 DIAGNOSIS — E1149 Type 2 diabetes mellitus with other diabetic neurological complication: Secondary | ICD-10-CM | POA: Diagnosis not present

## 2021-11-29 LAB — GLUCOSE, CAPILLARY
Glucose-Capillary: 182 mg/dL — ABNORMAL HIGH (ref 70–99)
Glucose-Capillary: 163 mg/dL — ABNORMAL HIGH (ref 70–99)

## 2021-11-29 NOTE — Progress Notes (Signed)
[name], Dewaine M ([id number]) [id number] Page 1 of 1 Visit Report for 11/29/2021 SuperBill Details Patient Name: Date of Service: [place], [place] [name] 11/29/2021 Medical Record Number: [id number] Patient Account Number: [id number] Date of Birth/Sex: Treating RN: 11-Nov-1970 (51 y.o. 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 LQ59.563Non-pressure chronic ulcer of other part of right foot with necrosis of bone M86.671 Other chronic osteomyelitis, right ankle and foot Physician Procedures Quantity CPT4 Code Description Modifier [id number] 29518- WC PHYS HYPERBARIC OXYGEN THERAPY 1 ICD-10 Diagnosis Description 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

## 2021-11-29 NOTE — Progress Notes (Addendum)
[name] ([id number]) [id number] Page 1 of 2 Visit Report for 11/29/2021 Arrival Information Details Patient Name: Date of Service: [name], [name] [name] 11/29/2021 8:00 [name] Medical Record Number: [id number] Patient Account Number: [id number] Date of Birth/Sex: Treating RN: 01-14-1971 [name] Primary Care Tracen Mahler: [name] Other Clinician: Donavan Burnet Referring Ted Leonhart: Treating Stpehanie Montroy/Extender: [name] in Treatment: 21 Visit Information History Since Last Visit All ordered tests and consults were completed: Yes Patient Arrived: Ambulatory Added or deleted any medications: No Arrival Time: 07:34 Any new allergies or adverse reactions: No Accompanied By: self Had a fall or experienced change in No Transfer Assistance: None activities of daily living that may affect Patient Identification Verified: Yes risk of falls: Secondary Verification Process Completed: Yes Signs or symptoms of abuse/neglect since last visito No Patient Requires Transmission-Based Precautions: No Hospitalized since last visit: No Patient Has Alerts: Yes Implantable device outside of the clinic excluding No cellular tissue based products placed in the center since last visit: Pain Present Now: No Electronic Signature(s) Signed: 11/29/2021 11:14:39 AM By: Donavan Burnet CHT EMT BS , , Entered By: Donavan Burnet on 11/29/2021 11:14:38 -------------------------------------------------------------------------------- Encounter Discharge Information Details Patient Name: Date of Service: [name] [name] 11/29/2021 8:00 [name] Medical Record Number: [id number] Patient Account Number: [id number] Date of Birth/Sex: Treating RN: [birth year] [name] Primary Care Camrin Lapre: [name] Other Clinician: Donavan Burnet Referring Jahmir Salo: Treating Tyr Franca/Extender: [name] in Treatment: 21 Encounter Discharge Information Items Discharge Condition: Stable Ambulatory Status: Ambulatory Discharge Destination: Home Transportation: Private Auto Accompanied By: self Schedule Follow-up Appointment: No Clinical Summary of Care: Electronic Signature(s) Signed: 11/29/2021 11:18:23 AM By: Donavan Burnet CHT EMT BS , , Entered By: Donavan Burnet on 11/29/2021 11:18:23 [name] ([id number]) [id number] Page 2 of 2 -------------------------------------------------------------------------------- Vitals Details Patient Name: Date of Service: [name] [name] 11/29/2021 8:00 [name] Medical Record Number: [id number] Patient Account Number: [id number] Date of Birth/Sex: Treating RN: 22-Jan-1970 [name] Primary Care Shahd Occhipinti: [name] Other Clinician: Donavan Burnet Referring Soren Lazarz: Treating Rollyn Scialdone/Extender: [name] in Treatment: 21 Vital Signs Time Taken: 07:56 Temperature (F): 98.1 Height (in): 72 Pulse (bpm): 71 Weight (lbs): 312 Respiratory Rate (breaths/min): 18 Body Mass Index (BMI): 42.3 Blood Pressure (mmHg): 127/81 Capillary Blood Glucose (mg/dl): 182 Reference Range: 80 - 120 mg / dl Electronic Signature(s) Signed: 11/29/2021 11:15:01 AM By: Donavan Burnet CHT EMT BS , , Entered By: Donavan Burnet on 11/29/2021 11:15:01

## 2021-11-29 NOTE — Progress Notes (Addendum)
[name], [name] ([id number]) [id number] Page 1 of 2 Visit Report for 11/29/2021 HBO Details Patient Name: Date of Service: [name], [name] 11/29/2021 8:00 A M Medical Record Number: [id number] Patient Account Number: [id number] Date of Birth/Sex: Treating RN: 02-May-1970 (51 y.o. [name] Primary Care Luisenrique Conran: [name] Other Clinician: Donavan Burnet Referring Jazen Spraggins: Treating Kassy Mcenroe/Extender: [name] in Treatment: 21 HBO Treatment Course Details Treatment Course Number: 1 Ordering Ellisa Devivo: [name] T Treatments Ordered: otal 80 HBO Treatment Start Date: 08/14/2021 HBO Indication: Diabetic Ulcer(s) of the Lower Extremity Standard/Conservative Wound Care tried and failed greater than or equal to 30 days HBO Treatment Details Treatment Number: 75 Patient Type: Outpatient Chamber Type: Monoplace Chamber Serial #: [id number] Treatment Protocol: 2.0 ATA with 90 minutes oxygen, and no air breaks Treatment Details Compression Rate Down: 2.0 psi / minute De-Compression Rate Up: 2.0 psi / minute Air breaks and breathing Decompress Decompress Compress Tx Pressure Begins Reached periods Begins Ends (leave unused spaces blank) Chamber Pressure (ATA 1 2 ------2 1 ) Clock Time (24 hr) 07:59 08:07 - - - - - - 09:37 09:46 Treatment Length: 107 (minutes) Treatment Segments: 4 Vital Signs Capillary Blood Glucose Reference Range: 80 - 120 mg / dl HBO Diabetic Blood Glucose Intervention Range: <131 mg/dl or >249 mg/dl Type: Time Vitals Blood Respiratory Capillary Blood Glucose Pulse Action Pulse: Temperature: Taken: Pressure: Rate: Glucose (mg/dl): Meter #: Oximetry (%) Taken: Pre 07:56 127/81 71 18 98.1 182 1 none per protocol Post 09:58 150/91 69 18 97.5 163 1 none per protocol Treatment Response Treatment Toleration: Well Treatment Completion Status: Treatment Completed without Adverse  Event Additional Procedure Documentation Tissue Sevierity: Fat layer exposed Physician HBO Attestation: I certify that I supervised this HBO treatment in accordance with Medicare guidelines. A trained emergency response team is readily available per Yes hospital policies and procedures. Continue HBOT as ordered. Yes Electronic Signature(s) Signed: 11/29/2021 1:15:41 PM By: [name] DO Previous Signature: 11/29/2021 11:17:31 AM Version By: Donavan Burnet CHT EMT BS , , Entered By: [name] on 11/29/2021 11:41:23 [name], [name] ([id number]) [id number] Page 2 of 2 -------------------------------------------------------------------------------- HBO Safety Checklist Details Patient Name: Date of Service: [name], [name] 11/29/2021 8:00 A M Medical Record Number: [id number] Patient Account Number: [id number] Date of Birth/Sex: Treating RN: Aug 21, 1970 (51 y.o. [name] Primary Care [name]: [name] Other Clinician: Donavan Burnet Referring Tery Hoeger: Treating Tekeyah Santiago/Extender: [name] in Treatment: 21 HBO Safety Checklist Items Safety Checklist Consent Form Signed Patient voided / foley secured and emptied When did you last eato 0650 Last dose of injectable or oral agent 0640 Ostomy pouch emptied and vented if applicable NA All implantable devices assessed, documented and approved NA Intravenous access site secured and place NA Valuables secured Linens and cotton and cotton/polyester blend (less than 51% polyester) Personal oil-based products / skin lotions / body lotions removed Wigs or hairpieces removed NA Smoking or tobacco materials removed NA Books / newspapers / magazines / loose paper removed Cologne, aftershave, perfume and deodorant removed Jewelry removed (may wrap wedding band) Make-up removed NA Hair care products removed Battery operated devices (external)  removed Heating patches and chemical warmers removed Titanium eyewear removed Nail polish cured greater than 10 hours NA Casting material cured greater than 10 hours NA Hearing aids removed NA Loose dentures or partials removed NA Prosthetics have been removed NA Patient demonstrates correct use of air break device (if applicable) Patient concerns have  been addressed Patient grounding bracelet on and cord attached to chamber Specifics for Inpatients (complete in addition to above) Medication sheet sent with patient NA Intravenous medications needed or due during therapy sent with patient NA Drainage tubes (e.g. nasogastric tube or chest tube secured and vented) NA Endotracheal or Tracheotomy tube secured NA Cuff deflated of air and inflated with saline NA Airway suctioned NA Notes Paper version used prior to treatment. Electronic Signature(s) Signed: 11/29/2021 11:16:07 AM By: Donavan Burnet CHT EMT BS , , Entered By: Donavan Burnet on 11/29/2021 11:16:07

## 2021-12-02 ENCOUNTER — Encounter (HOSPITAL_BASED_OUTPATIENT_CLINIC_OR_DEPARTMENT_OTHER): Payer: BLUE CROSS/BLUE SHIELD | Admitting: Internal Medicine

## 2021-12-02 DIAGNOSIS — L97514 Non-pressure chronic ulcer of other part of right foot with necrosis of bone: Secondary | ICD-10-CM | POA: Diagnosis not present

## 2021-12-02 DIAGNOSIS — M86671 Other chronic osteomyelitis, right ankle and foot: Secondary | ICD-10-CM | POA: Diagnosis not present

## 2021-12-02 DIAGNOSIS — E1149 Type 2 diabetes mellitus with other diabetic neurological complication: Secondary | ICD-10-CM | POA: Diagnosis not present

## 2021-12-02 LAB — GLUCOSE, CAPILLARY
Glucose-Capillary: 151 mg/dL — ABNORMAL HIGH (ref 70–99)
Glucose-Capillary: 143 mg/dL — ABNORMAL HIGH (ref 70–99)

## 2021-12-02 NOTE — Progress Notes (Signed)
[name], [name] ([id number]) [id number] Page 1 of 2 Visit Report for 12/02/2021 Arrival Information Details Patient Name: Date of Service: [name], [place] [name] 12/02/2021 8:00 [name] Medical Record Number: [id number] Patient Account Number: [id number] Date of Birth/Sex: Treating RN: May 14, 1970 [name], Lovena Le Primary Care Ligaya Cormier: [name] Other Clinician: [name] Referring Beverly Ferner: Treating Breonia Kirstein/Extender: [name] in Treatment: 21 Visit Information History Since Last Visit All ordered tests and consults were completed: Yes Patient Arrived: Ambulatory Added or deleted any medications: No Arrival Time: 07:45 Any new allergies or adverse reactions: No Accompanied By: None Had a fall or experienced change in No Transfer Assistance: None activities of daily living that may affect Patient Identification Verified: Yes risk of falls: Secondary Verification Process Completed: Yes Signs or symptoms of abuse/neglect since last visito No Patient Requires Transmission-Based Precautions: No Hospitalized since last visit: No Patient Has Alerts: Yes Implantable device outside of the clinic excluding No cellular tissue based products placed in the center since last visit: Pain Present Now: No Electronic Signature(s) Signed: 12/02/2021 3:59:32 PM By: [name] EMT Entered By: [name] on 12/02/2021 15:59:31 -------------------------------------------------------------------------------- Encounter Discharge Information Details Patient Name: Date of Service: [place], [name] 12/02/2021 8:00 [name] Medical Record Number: [id number] Patient Account Number: [id number] Date of Birth/Sex: Treating RN: [birth year] [name] Primary Care Naliya Gish: [name] Other Clinician: [name] Referring Mrytle Bento: Treating Murray Durrell/Extender: [name] in  Treatment: 21 Encounter Discharge Information Items Discharge Condition: Stable Ambulatory Status: Ambulatory Discharge Destination: Home Transportation: Private Auto Accompanied By: None Schedule Follow-up Appointment: Yes Clinical Summary of Care: Notes late entry due to [facility] charting problems. Entry from paper charting. Electronic Signature(s) Signed: 12/03/2021 8:51:11 AM By: [name] EMT Entered By: [name] on 12/03/2021 08:51:10 [name], [name] ([id number]) [id number] Page 2 of 2 -------------------------------------------------------------------------------- Vitals Details Patient Name: Date of Service: [name], [name] 12/02/2021 8:00 [name] Medical Record Number: [id number] Patient Account Number: [id number] Date of Birth/Sex: Treating RN: Mar 18, 1970 [name] Primary Care Kylo Gavin: [name] Other Clinician: [name] Referring English Craighead: Treating Khali Perella/Extender: [name] in Treatment: 21 Vital Signs Time Taken: 08:04 Temperature (F): 97.9 Height (in): 72 Pulse (bpm): 82 Weight (lbs): 312 Respiratory Rate (breaths/min): 16 Body Mass Index (BMI): 42.3 Blood Pressure (mmHg): 136/91 Capillary Blood Glucose (mg/dl): 151 Reference Range: 80 - 120 mg / dl Electronic Signature(s) Signed: 12/02/2021 4:00:33 PM By: [name] EMT Entered By: [name] on 12/02/2021 16:00:33

## 2021-12-02 NOTE — Progress Notes (Signed)
[name], [name] ([id number]) [id number] Page 1 of 2 Visit Report for 12/02/2021 HBO Details Patient Name: Date of Service: [name], [name] 12/02/2021 8:00 A M Medical Record Number: [id number] Patient Account Number: [id number] Date of Birth/Sex: Treating RN: 1970/11/15 [name] Primary Care Sheniqua Carolan: [name] Other Clinician: [name] Referring Roldan Laforest: Treating Saranda Legrande/Extender: [name] in Treatment: 21 HBO Treatment Course Details Treatment Course Number: 1 Ordering Edrei Norgaard: [name] T Treatments Ordered: otal 80 HBO Treatment Start Date: 08/14/2021 HBO Indication: Diabetic Ulcer(s) of the Lower Extremity Standard/Conservative Wound Care tried and failed greater than or equal to 30 days HBO Treatment Details Treatment Number: 76 Patient Type: Outpatient Chamber Type: Monoplace Chamber Serial #: [id number] Treatment Protocol: 2.0 ATA with 90 minutes oxygen, and no air breaks Treatment Details Compression Rate Down: 2.0 psi / minute De-Compression Rate Up: 2.0 psi / minute Air breaks and breathing Decompress Decompress Compress Tx Pressure Begins Reached periods Begins Ends (leave unused spaces blank) Chamber Pressure (ATA 1 2 ------2 1 ) Clock Time (24 hr) 08:12 08:19 - - - - - - 09:49 09:58 Treatment Length: 106 (minutes) Treatment Segments: 4 Vital Signs Capillary Blood Glucose Reference Range: 80 - 120 mg / dl HBO Diabetic Blood Glucose Intervention Range: <131 mg/dl or >249 mg/dl Time Vitals Blood Respiratory Capillary Blood Glucose Pulse Action Type: Pulse: Temperature: Taken: Pressure: Rate: Glucose (mg/dl): Meter #: Oximetry (%) Taken: Pre 08:04 136/91 82 16 97.9 151 Post 10:04 147/85 68 16 97.3 143 Treatment Response Treatment Toleration: Well Treatment Completion Status: Treatment Completed without Adverse Event Treatment Notes late entry due to  [facility] charting problems. Additional Procedure Documentation Tissue Sevierity: Necrosis of bone Physician HBO Attestation: I certify that I supervised this HBO treatment in accordance with Medicare guidelines. A trained emergency response team is readily available per Yes hospital policies and procedures. Continue HBOT as ordered. Yes Electronic Signature(s) Signed: 12/03/2021 6:10:28 PM By: Hoffman, Jessica DO [name], [name] 12/03/2021 6:10:28 PM By: [name] DO SignedJerilynn Mages ([id number]) [id number] Page 2 of 2 Previous Signature: 12/03/2021 8:46:44 AM Version By: [name] EMT Entered By: [name] on 12/03/2021 18:07:08 -------------------------------------------------------------------------------- HBO Safety Checklist Details Patient Name: Date of Service: [name], [name] 12/02/2021 8:00 A M Medical Record Number: [id number] Patient Account Number: [id number] Date of Birth/Sex: Treating RN: 12/27/1970 [name] Primary Care Andrews Tener: [name] Other Clinician: [name] Referring Genova Kiner: Treating [name]/Extender: [name] in Treatment: 21 HBO Safety Checklist Items Safety Checklist Consent Form Signed Patient voided / foley secured and emptied When did you last eato 0655 Last dose of injectable or oral agent 0655 Ostomy pouch emptied and vented if applicable NA All implantable devices assessed, documented and approved NA Intravenous access site secured and place NA Valuables secured Linens and cotton and cotton/polyester blend (less than 51% polyester) Personal oil-based products / skin lotions / body lotions removed Wigs or hairpieces removed NA Smoking or tobacco materials removed Books / newspapers / magazines / loose paper removed Cologne, aftershave, perfume and deodorant removed Jewelry removed (may wrap wedding band) Make-up removed NA Hair care products  removed Battery operated devices (external) removed Heating patches and chemical warmers removed Titanium eyewear removed NA Nail polish cured greater than 10 hours NA Casting material cured greater than 10 hours NA Hearing aids removed NA Loose dentures or partials removed NA Prosthetics have been removed NA Patient demonstrates correct use of air break device (  if applicable) Patient concerns have been addressed Patient grounding bracelet on and cord attached to chamber Specifics for Inpatients (complete in addition to above) Medication sheet sent with patient NA Intravenous medications needed or due during therapy sent with patient NA Drainage tubes (e.g. nasogastric tube or chest tube secured and vented) NA Endotracheal or Tracheotomy tube secured NA Cuff deflated of air and inflated with saline NA Airway suctioned NA Notes The safety check was done before the treatment was started. Electronic Signature(s) Signed: 12/02/2021 4:09:53 PM By: [name] EMT Entered By: [name] on 12/02/2021 16:09:53

## 2021-12-03 ENCOUNTER — Encounter: Payer: Self-pay | Admitting: Family Medicine

## 2021-12-03 ENCOUNTER — Encounter (HOSPITAL_BASED_OUTPATIENT_CLINIC_OR_DEPARTMENT_OTHER): Payer: BLUE CROSS/BLUE SHIELD | Admitting: General Surgery

## 2021-12-03 ENCOUNTER — Encounter (HOSPITAL_BASED_OUTPATIENT_CLINIC_OR_DEPARTMENT_OTHER): Payer: BLUE CROSS/BLUE SHIELD | Admitting: Internal Medicine

## 2021-12-03 ENCOUNTER — Ambulatory Visit: Payer: BLUE CROSS/BLUE SHIELD | Admitting: Family Medicine

## 2021-12-03 VITALS — BP 128/78 | HR 82 | Temp 98.7°F | Ht 72.0 in | Wt 308.0 lb

## 2021-12-03 DIAGNOSIS — D649 Anemia, unspecified: Secondary | ICD-10-CM | POA: Diagnosis not present

## 2021-12-03 DIAGNOSIS — L97514 Non-pressure chronic ulcer of other part of right foot with necrosis of bone: Secondary | ICD-10-CM

## 2021-12-03 DIAGNOSIS — E1169 Type 2 diabetes mellitus with other specified complication: Secondary | ICD-10-CM

## 2021-12-03 DIAGNOSIS — E1149 Type 2 diabetes mellitus with other diabetic neurological complication: Secondary | ICD-10-CM | POA: Diagnosis not present

## 2021-12-03 DIAGNOSIS — I10 Essential (primary) hypertension: Secondary | ICD-10-CM

## 2021-12-03 DIAGNOSIS — M86671 Other chronic osteomyelitis, right ankle and foot: Secondary | ICD-10-CM

## 2021-12-03 DIAGNOSIS — N182 Chronic kidney disease, stage 2 (mild): Secondary | ICD-10-CM

## 2021-12-03 DIAGNOSIS — E785 Hyperlipidemia, unspecified: Secondary | ICD-10-CM

## 2021-12-03 LAB — GLUCOSE, CAPILLARY
Glucose-Capillary: 172 mg/dL — ABNORMAL HIGH (ref 70–99)
Glucose-Capillary: 163 mg/dL — ABNORMAL HIGH (ref 70–99)

## 2021-12-03 LAB — CBC WITH DIFFERENTIAL/PLATELET
Basophils Absolute: 0 10*3/uL (ref 0.0–0.1)
Basophils Relative: 0.3 % (ref 0.0–3.0)
Eosinophils Absolute: 0.2 10*3/uL (ref 0.0–0.7)
Eosinophils Relative: 1.7 % (ref 0.0–5.0)
HCT: 33.4 % — ABNORMAL LOW (ref 39.0–52.0)
Hemoglobin: 11 g/dL — ABNORMAL LOW (ref 13.0–17.0)
Lymphocytes Relative: 24.6 % (ref 12.0–46.0)
Lymphs Abs: 2.5 10*3/uL (ref 0.7–4.0)
MCHC: 32.8 g/dL (ref 30.0–36.0)
MCV: 80.6 fl (ref 78.0–100.0)
Monocytes Absolute: 0.5 10*3/uL (ref 0.1–1.0)
Monocytes Relative: 5.2 % (ref 3.0–12.0)
Neutro Abs: 6.9 10*3/uL (ref 1.4–7.7)
Neutrophils Relative %: 68.2 % (ref 43.0–77.0)
Platelets: 298 10*3/uL (ref 150.0–400.0)
RBC: 4.14 Mil/uL — ABNORMAL LOW (ref 4.22–5.81)
RDW: 15.7 % — ABNORMAL HIGH (ref 11.5–15.5)
WBC: 10.1 10*3/uL (ref 4.0–10.5)

## 2021-12-03 MED ORDER — TRESIBA FLEXTOUCH 200 UNIT/ML ~~LOC~~ SOPN: 80.0000 [IU] | PEN_INJECTOR | Freq: Every day | SUBCUTANEOUS | 3 refills | Status: DC

## 2021-12-03 NOTE — Progress Notes (Signed)
[name], [place] ([id number]) [id number] Page 1 of 2 Visit Report for 12/03/2021 HBO Details Patient Name: Date of Service: [name], [name] 12/03/2021 8:30 A M Medical Record Number: [id number] Patient Account Number: [id number] Date of Birth/Sex: Treating RN: 1970/08/08 [name], [name] Primary Care [name]: [name] Other Clinician: Valeria [name] Referring [name]: Treating [name]/Extender: [name] in Treatment: 21 HBO Treatment Course Details Treatment Course Number: 1 Ordering [name]: [name] T Treatments Ordered: otal 80 HBO Treatment Start Date: 08/14/2021 HBO Indication: Diabetic Ulcer(s) of the Lower Extremity Standard/Conservative Wound Care tried and failed greater than or equal to 30 days HBO Treatment Details Treatment Number: 77 Patient Type: Outpatient Chamber Type: Monoplace Chamber Serial #: [id number] Treatment Protocol: 2.0 ATA with 90 minutes oxygen, and no air breaks Treatment Details Compression Rate Down: 2.0 psi / minute De-Compression Rate Up: 2.0 psi / minute Air breaks and breathing Decompress Decompress Compress Tx Pressure Begins Reached periods Begins Ends (leave unused spaces blank) Chamber Pressure (ATA 1 2 ------2 1 ) Clock Time (24 hr) 08:26 08:34 - - - - - - 10:04 10:12 Treatment Length: 106 (minutes) Treatment Segments: 4 Vital Signs Capillary Blood Glucose Reference Range: 80 - 120 mg / dl HBO Diabetic Blood Glucose Intervention Range: <131 mg/dl or >249 mg/dl Time Vitals Blood Respiratory Capillary Blood Glucose Pulse Action Type: Pulse: Temperature: Taken: Pressure: Rate: Glucose (mg/dl): Meter #: Oximetry (%) Taken: Pre 08:20 150/91 85 16 98.1 Post 10:18 141/94 72 18 97.8 163 Pre 07:53 172 Treatment Response Treatment Toleration: Well Treatment Completion Status: Treatment Completed without Adverse Event Additional Procedure  Documentation Tissue Sevierity: Necrosis of bone Physician HBO Attestation: I certify that I supervised this HBO treatment in accordance with Medicare guidelines. A trained emergency response team is readily available per Yes hospital policies and procedures. Continue HBOT as ordered. Yes Electronic Signature(s) Unsigned Previous Signature: 12/03/2021 2:50:02 PM Version By: [name] EMT Entered By: [name] on 12/03/2021 16:47:00 [name], [name] ([id number]) [id number] Page 2 of 2 -------------------------------------------------------------------------------- HBO Safety Checklist Details Patient Name: Date of Service: [name], [name] 12/03/2021 8:30 A M Medical Record Number: [id number] Patient Account Number: [id number] Date of Birth/Sex: Treating RN: 1970/12/19 [name], [name] Primary Care Beckett Hickmon: [name] Other Clinician: Valeria [name] Referring [name]: Treating [name]: [name] in Treatment: 21 HBO Safety Checklist Items Safety Checklist Consent Form Signed Patient voided / foley secured and emptied When did you last eato 0700 Last dose of injectable or oral agent 0655 Ostomy pouch emptied and vented if applicable NA All implantable devices assessed, documented and approved NA Intravenous access site secured and place NA Valuables secured Linens and cotton and cotton/polyester blend (less than 51% polyester) Personal oil-based products / skin lotions / body lotions removed Wigs or hairpieces removed NA Smoking or tobacco materials removed Books / newspapers / magazines / loose paper removed Cologne, aftershave, perfume and deodorant removed Jewelry removed (may wrap wedding band) Make-up removed NA Hair care products removed Battery operated devices (external) removed Heating patches and chemical warmers removed Titanium eyewear removed NA Nail polish cured  greater than 10 hours NA Casting material cured greater than 10 hours NA Hearing aids removed NA Loose dentures or partials removed NA Prosthetics have been removed NA Patient demonstrates correct use of air break device (if applicable) Patient concerns have been addressed Patient grounding bracelet on and cord attached to chamber Specifics for Inpatients (complete in addition  to above) Medication sheet sent with patient NA Intravenous medications needed or due during therapy sent with patient NA Drainage tubes (e.g. nasogastric tube or chest tube secured and vented) NA Endotracheal or Tracheotomy tube secured NA Cuff deflated of air and inflated with saline NA Airway suctioned NA Notes The safety check was done before the treatment was started. Electronic Signature(s) Signed: 12/03/2021 2:48:56 PM By: [name] EMT Entered By: [name] on 12/03/2021 14:48:55

## 2021-12-03 NOTE — Patient Instructions (Addendum)
Please stop by lab before you go If you have mychart- we will send your results within 3 business days of [place] receiving them.  If you do not have mychart- we will call you about results within 5 business days of [place] receiving them.  *please also note that you will see labs on mychart as soon as they post. I will later go in and write notes on them- will say "notes from Dr. Yong Channel"   Recommended follow up: Return in about 14 weeks (around 03/11/2022) for followup or sooner if needed.Schedule b4 you leave. -can also go ahead and schedule a physical 14 weeks or 4 months out from next visit in february -cancel November 29th visit on your way out

## 2021-12-03 NOTE — Progress Notes (Signed)
[name], [name] ([id number]) [id number] Page 1 of 11 Visit Report for 12/03/2021 Chief Complaint Document Details Patient Name: Date of Service: [name], [name] 12/03/2021 7:45 [name] Medical Record Number: [id number] Patient Account Number: [id number] Date of Birth/Sex: Treating RN: [birth year] (51 y.o. [name] Primary Care Provider: [name] Other Clinician: Referring Provider: Treating Provider/Extender: [name] in Treatment: 21 Information Obtained from: Patient Chief Complaint Patients presents for treatment of an open diabetic ulcer Electronic Signature(s) Signed: 12/03/2021 8:22:08 AM By: [name] MD FACS Entered By: [name] on 12/03/2021 08:22:07 -------------------------------------------------------------------------------- Cellular or Tissue Based Product Details Patient Name: Date of Service: [name], [name] 12/03/2021 7:45 [name] Medical Record Number: [id number] Patient Account Number: [id number] Date of Birth/Sex: Treating RN: 1970-03-29 [name] Primary Care Provider: [name] Other Clinician: Referring Provider: Treating Provider/Extender: [name] in Treatment: 21 Cellular or Tissue Based Product Type Wound #2 Right,Lateral,Plantar Foot Applied to: Performed By: Physician [name], MD Cellular or Tissue Based Product Type: Kerecis Omega3 Level of Consciousness (Pre-procedure): Awake and Alert Pre-procedure Verification/Time Out Yes - 08:07 Taken: Location: genitalia / hands / feet / multiple digits Wound Size (sq cm): 0.45 Product Size (sq cm): 21 Waste Size (sq cm): 14 Waste Reason: wound size Amount of Product Applied (sq cm): 7 Instrument Used: Forceps, Scissors Lot #: [id number] Expiration Date: 06/21/2023 Fenestrated: No Reconstituted: Yes Solution Type: normal saline Solution Amount: 5 Lot #:  [id number] Solution Expiration Date: 05/19/2024 Secured: Yes Secured With: Steri-Strips Dressing Applied: Yes Primary Dressing: adaptic Procedural Pain: 0 Post Procedural Pain: 0 Response to Treatment: Procedure was tolerated well Level of Consciousness (Post- Awake and [address] [name], [name] ([id number]) [id number] Page 2 of 11 procedure): Post Procedure Diagnosis Same as Pre-procedure Notes scribed for [name] by Adline Peals, RN Electronic Signature(s) Signed: 12/03/2021 12:07:49 PM By: [name] MD FACS Signed: 12/03/2021 3:48:01 PM By: Adline Peals Entered By: Adline Peals on 12/03/2021 08:10:20 -------------------------------------------------------------------------------- Debridement Details Patient Name: Date of Service: [name], [name] 12/03/2021 7:45 [name] Medical Record Number: [id number] Patient Account Number: [id number] Date of Birth/Sex: Treating RN: 1970/10/05 [name] Primary Care Provider: [name] Other Clinician: Referring Provider: Treating Provider/Extender: [name] in Treatment: 21 Debridement Performed for Assessment: Wound #2 Right,Lateral,Plantar Foot Performed By: Physician [name], MD Debridement Type: Debridement Severity of Tissue Pre Debridement: Fat layer exposed Level of Consciousness (Pre-procedure): Awake and Alert Pre-procedure Verification/Time Out Yes - 08:03 Taken: Start Time: 08:03 Pain Control: Lidocaine 4% T opical Solution T Area Debrided (L x W): otal 0.3 (cm) x 1.5 (cm) = 0.45 (cm) Tissue and other material debrided: Non-Viable, Callus, Skin: Epidermis Level: Skin/Epidermis Debridement Description: Selective/Open Wound Instrument: Curette Bleeding: Minimum Hemostasis Achieved: Pressure Response to Treatment: Procedure was tolerated well Level of Consciousness (Post- Awake and Alert procedure): Post  Debridement Measurements of Total Wound Length: (cm) 0.3 Width: (cm) 1.5 Depth: (cm) 0.8 Volume: (cm) 0.283 Character of Wound/Ulcer Post Debridement: Improved Severity of Tissue Post Debridement: Fat layer exposed Post Procedure Diagnosis Same as Pre-procedure Notes scribed for [name] by Adline Peals, RN Electronic Signature(s) Signed: 12/03/2021 12:07:49 PM By: [name] MD FACS Signed: 12/03/2021 3:48:01 PM By: Adline Peals Entered By: Adline Peals on 12/03/2021 08:06:28 [name], [name] ([id number]) [id number] Page 3 of 11 -------------------------------------------------------------------------------- HPI Details Patient Name: Date of Service: [name], [name] 12/03/2021 7:45 [name]  Medical Record Number: [id number] Patient Account Number: [id number] Date of Birth/Sex: Treating RN: Nov 12, 1970 (51 y.o. 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 The most recent amputation was in August 2022. At that operation, antibiotic beads were placed in the wound. He has been managed by podiatry for his procedures and management of his wounds. He has been in a Water engineer. He is on oral doxycycline. They have been using Betadine wet to dry dressings along with Iodosorb. The patient states that when he thinks the wound is getting too dry, he applies topical Neosporin. At his last visit, on June 7 of this year, the podiatrist determined that he felt the wound was stalled and referred him to wound care for additional evaluation and management. An MRI has been ordered, but not  yet scheduled or performed. Pathology from his operation in August 2022 demonstrated findings consistent with chronic osteomyelitis. Today, there is a large irregular wound on the plantar surface of his right foot, at about the level of the fifth metatarsal head. This tracks through to a pinpoint opening on the dorsal lateral portion of his foot. The intake nurse reported purulent drainage. There is some malodor from the wound. No frank necrosis identified. 07/11/2021: Today, the wounds do not connect. I attempted multiple times from various directions and the shared tunnel is no longer open. He has some slough accumulation on the dorsal part of his foot as well as slough and callus buildup on the plantar surface. His MRI is scheduled for June 29. No purulent drainage or malodor appreciated today. 07/19/2021: The lateral foot wound has closed and there is no tunnel connecting the plantar foot wound to that site. The plantar foot wound still probes quite deeply, however. There is some slough, eschar, and nonviable tissue accumulated in the wound bed. No malodorous drainage present. His MRI was performed yesterday and is consistent with fairly extensive osteomyelitis. 07/29/2021: The patient has an appointment with infectious disease on July 18 to treat his osteomyelitis. The plantar wound still probes quite deeply, approaching bone. The orifice has narrowed quite substantially, however, making it more difficult for him to pack. 08/05/2021: The tunnel connecting the lateral foot wound to the plantar foot wound has reopened. He sees infectious disease tomorrow to discuss long-term antibiotic treatment for osteomyelitis. There was a bit of murky drainage in the wound, but this was noted after he had had topical lidocaine applied so may have just been a blob of the anesthetic. No odor or frank purulent drainage. The wound probes deeply at the midfoot approaching bone. He does have MRI results confirming his  diagnosis of osteomyelitis. He has failed to progress with conventional treatment and I think his best chance for preservation of the foot is to initiate hyperbaric oxygen therapy. 08/13/2021: The tunnel connecting the 2 wounds has closed again. He has a PICC line and is getting IV daptomycin and cefepime. EKG and chest x-ray are within normal limits. He is scheduled to start hyperbaric oxygen therapy tomorrow. The wound in his midfoot probes deeply, approaching bone. The skin at the orifice continues to heaped up and threatens to close over despite the large cavity within. No erythema, induration, or purulent drainage.  The wound on his lateral foot is fairly small and quite clean. 08/19/2021: The lateral foot wound has nearly closed. The wound in his midfoot does not probe quite as deeply today. The skin at the orifice continues to try to roll in and obscure the opening. No frankly necrotic tissue appreciated. He is tolerating hyperbaric oxygen therapy. 08/26/2021: The lateral foot wound has closed completely. The wound in his midfoot is shallower again today. The wound orifice is contracting. We are using gentamicin and silver alginate. He continues on IV daptomycin and cefepime and is tolerating hyperbaric oxygen therapy without difficulty. 09/02/2021: His foot is a little bit sore today but he was up walking on it all weekend doing back to school shopping with his daughter. The tunneling is down to about 3 cm and I do not appreciate bone at the tip of the probe. The wound is clean but has some callus creating an overhanging lip at the distal aspect. He continues to receive hyperbaric oxygen therapy as well as IV daptomycin and cefepime. 09/09/2021: His wound continues to improve. The tunnel has come in by 0.9 cm. He continues to form callus around the orifice of the wound. He is tolerating hyperbaric oxygen therapy along with his IV antibiotics. 09/16/2021: The tunneling is down to just half a  centimeter. He continues to build up callus around the orifice of the wound. He completed his course of IV antibiotics and his PICC line is scheduled to be removed this Friday. He is tolerating hyperbaric oxygen without difficulty. We have been applying topical gentamicin and silver alginate to his wound. 09/24/2021: The wound depth has come in even further. There is a little bit of callus buildup around the orifice. The opening is quite narrow, at this point. 09/30/2021: The wound depth is about the same this week. The opening continues to contract with callus accumulation. There is some discoloration of the skin on the lateral part of his foot and he admitted to walking more than usual this weekend and also wearing a different pair shoes. He continues to tolerate hyperbaric oxygen therapy without difficulty. 10/07/2021: The wound depth has contracted a bit and is now approximately 1 to 1.5 cm. The orifice of the wound continues to try and close in over the space. There is just some callus and skin around the margins obscuring the orifice. 10/14/2021: The wound depth has come in a little bit more. The orifice of the wound is rolling inwards with epithelium and callus, making it difficult to pack the wound. 10/21/2021: There is still 1 portion of the wound, at the lateral aspect, that is still a couple of centimeters deep. The architecture of the wound makes this somewhat challenging to access and pack. Everything else looks clean. He has his usual accumulation of callus and skin, narrowing the orifice. 10/29/2021: No significant change to the depth of the wound. The orifice continues to contract with callus and skin narrowing the opening. His wife has been packing the wound and doing an excellent job. [name], [name] ([id number]) [id number] Page 4 of 11 11/05/2021: The depth of the wound has come in by about a centimeter. There is less callus accumulation around the  orificedespite this, the wound is narrower today. We are still awaiting insurance approval for Kerecis powder. 11/12/2021: The depth of the wound continues to contract. He has accumulated some callus around the orifice, as usual. We have received a verbal confirmation that he is approved for Kerecis, but no formal paperwork has yet  been received. 11/19/2021: The depth continues to come in. There is callus accumulation around the orifice, as usual. He has been formally approved for Kerecis, but unfortunately we do not have a billing mechanism for the powder in [facility] yet. We are working to address this so we can begin treatment. 11/25/2021: Continued filling in of the depth of the wound. Continued buildup of callus around the orifice. No concern for infection. As we do not have a way to bill for Kerecis powder, but are capable of billing for the [name] sheet, we are going to use that on him instead. 12/03/2021: The depth of the wound continues to fill in. As usual, he has accumulated callus and skin around the orifice of the wound. No concern for infection. He will complete his hyperbaric oxygen therapy this week. Electronic Signature(s) Signed: 12/03/2021 8:22:57 AM By: [name] MD FACS Entered By: [name] on 12/03/2021 08:22:57 -------------------------------------------------------------------------------- Physical Exam Details Patient Name: Date of Service: [name], [name] 12/03/2021 7:45 [name] Medical Record Number: [id number] Patient Account Number: [id number] Date of Birth/Sex: Treating RN: 1970-10-11 (51 y.o. [name] Primary Care Provider: [name] Other Clinician: Referring Provider: Treating Provider/Extender: [name] in Treatment: 21 Constitutional Hypertensive, asymptomatic. . . . No acute distress.Marland Kitchen Respiratory Normal work of breathing on room air.. Notes 12/03/2021: The depth of the wound continues to fill in. As usual, he has  accumulated callus and skin around the orifice of the wound. No concern for infection. Electronic Signature(s) Signed: 12/03/2021 8:23:24 AM By: [name] MD FACS Entered By: [name] on 12/03/2021 40:98:11 -------------------------------------------------------------------------------- Physician Orders Details Patient Name: Date of Service: [name], [name] 12/03/2021 7:45 [name] Medical Record Number: [id number] Patient Account Number: [id number] Date of Birth/Sex: Treating RN: Dec 11, 1970 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 - Dr Celine Ahr Room 2 Return A Anesthetic Wound #2 Right,Lateral,Plantar Foot (In clinic) Topical Lidocaine 4% applied to wound bed Cellular or Tissue Based Products Cellular or Tissue Based Product Type: - Kerecis #2 Cellular or Tissue Based Product applied to wound bed, secured with steri-strips, cover with Adaptic or Mepitel. (DO NOT REMOVE). Bathing/ Shower/ Hygiene May shower with protection but do not get wound dressing(s) wet. Off-Loading Other: - Try to not stand on your feet too much Hyperbaric Oxygen Therapy Evaluate for HBO Therapy Indication: - wagner grade 3 diabetic foot ulcer right foot 2.0 ATA for 90 Minutes without A Breaks ir Total Number of Treatments: - 40 more treatments for a total of 80 09/30/21 One treatments per day (delivered Monday through Friday unless  otherwise specified in Special Instructions below): Finger stick Blood Glucose Pre- and Post- HBOT Treatment. Follow Hyperbaric Oxygen Glycemia Protocol Afrin (Oxymetazoline HCL) 0.05% nasal spray - 1 spray in both nostrils daily as needed prior to HBO treatment for difficulty clearing ears Wound Treatment Wound #2 - Foot Wound Laterality: Plantar, Right, Lateral Cleanser: Soap and Water Every Other Day/15 Days Discharge Instructions: May shower and wash wound with dial antibacterial soap and water prior to dressing change.  Cleanser: Wound Cleanser (Generic) Every Other Day/15 Days Discharge Instructions: Cleanse the wound with wound cleanser prior to applying a clean dressing using gauze sponges, not tissue or cotton balls. Peri-Wound Care: cotton tipped applicators (Generic) Every Other Day/15 Days Prim Dressing: kerecis Every Other Day/15 Days ary Discharge Instructions: do not remove Secondary Dressing: Woven Gauze Sponge, Non-Sterile 4x4 in Every Other Day/15 Days Discharge Instructions: Apply over primary dressing as directed. Secured With: Paper Tape, 2x10 (in/yd) (Generic) Every Other Day/15 Days Discharge Instructions: Secure dressing with tape as directed. Compression Wrap: Kerlix Roll 4.5x3.1 (in/yd) (Generic) Every Other Day/15 Days Discharge Instructions: Apply Kerlix and Coban compression as directed. Patient Medications llergies: No Known Allergies A Notifications Medication Indication Start End 12/03/2021 lidocaine DOSE topical 4 % cream - cream topical GLYCEMIA INTERVENTIONS PROTOCOL PRE-HBO GLYCEMIA INTERVENTIONS ACTION INTERVENTION Obtain pre-HBO capillary blood glucose (ensure 1 physician order is in chart). A. Notify HBO physician and await physician orders. 2 If result is 70 mg/dl or below: B. If the result meets the hospital definition of a critical result, follow hospital policy. A. Give patient an 8 ounce Glucerna Shake, an 8 ounce Ensure, or 8  ounces of a Glucerna/Ensure equivalent dietary supplement*. B. Wait 30 minutes. If result is 71 mg/dl to 130 mg/dl: C. Retest patients capillary blood glucose (CBG). D. If result greater than or equal to 110 mg/dl, proceed with HBO. If result less than 110 mg/dl, notify HBO physician and consider holding HBO. [name], [name] ([id number]) [id number] Page 6 of 11 If result is 131 mg/dl to 249 mg/dl: A. Proceed with HBO. A. Notify HBO physician and await physician orders. B. It is recommended to hold HBO and do If result is 250 mg/dl or greater: blood/urine ketone testing. C. If the result meets the hospital definition of a critical result, follow hospital policy. POST-HBO GLYCEMIA INTERVENTIONS ACTION INTERVENTION Obtain post HBO capillary blood glucose (ensure 1 physician order is in chart). A. Notify HBO physician and await physician orders. 2 If result is 70 mg/dl or below: B. If the result meets the hospital definition of a critical result, follow hospital policy. A. Give patient an 8 ounce Glucerna Shake, an 8 ounce Ensure, or 8 ounces of a Glucerna/Ensure equivalent dietary supplement*. B. Wait 15 minutes for symptoms of If result is 71 mg/dl to 100 mg/dl: hypoglycemia (i.e. nervousness, anxiety, sweating, chills, clamminess, irritability, confusion, tachycardia or dizziness). C. If patient asymptomatic, discharge patient. If patient symptomatic, repeat capillary blood glucose (CBG) and notify HBO physician. If result is 101 mg/dl to 249 mg/dl: A. Discharge patient. A. Notify HBO physician and await physician orders. B. It is recommended to do blood/urine ketone If result is 250 mg/dl or greater: testing. C. If the result meets the hospital definition of a critical result, follow hospital policy. *Juice or candies are NOT equivalent products. If patient refuses the Glucerna or Ensure, please consult the hospital dietitian for an  appropriate substitute. Electronic Signature(s) Signed: 12/03/2021 12:07:49 PM By: [name] MD FACS Entered By: [name] on 12/03/2021 08:26:41 -------------------------------------------------------------------------------- Problem List Details Patient Name: Date of Service: [name], [name] 12/03/2021 7:45 [name] Medical Record Number: [id number] Patient Account Number: [id number] Date of Birth/Sex: Treating RN: 1970/07/18 (51 y.o. 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 08/27/8108 No Yes M86.671  Other chronic osteomyelitis, right ankle and foot 07/04/2021 No Yes L97.514 Non-pressure chronic ulcer of other part of right foot with necrosis of bone 07/04/2021 No Yes Inactive Problems Resolved Problems [name], Demarus [name] ([id number]) [id number] Page 7 of 11 Electronic Signature(s) Signed: 12/03/2021 8:20:20 AM By: [name] MD FACS Entered By: [name] on 12/03/2021 08:20:20 -------------------------------------------------------------------------------- Progress Note Details Patient Name: Date of Service: [name], [name] 12/03/2021 7:45 [name] Medical Record Number: [id number] Patient Account Number: [id number] Date of Birth/Sex: Treating RN: 1970/04/20 (51 y.o. 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 The most recent amputation was in August 2022. At that operation, antibiotic beads were placed in the wound. He has been managed by podiatry for his procedures and management of his wounds. He has been in a Water engineer. He is on oral doxycycline. They have been using Betadine wet to dry dressings along with Iodosorb. The patient states that when he thinks the wound is getting too dry, he applies topical Neosporin. At his last visit, on June 7 of this year, the podiatrist determined that he felt the wound was stalled and referred him to wound care for additional evaluation and management. An MRI has been ordered, but not yet scheduled or performed. Pathology from his operation in August 2022 demonstrated findings consistent with chronic osteomyelitis. Today, there is a large irregular wound on the plantar surface of his right foot, at about the level of the fifth metatarsal head. This tracks through to a pinpoint opening on the dorsal lateral portion of his foot. The intake nurse reported purulent drainage. There is some malodor from the wound. No frank necrosis identified. 07/11/2021: Today, the wounds do not connect. I attempted multiple times from various directions and the shared tunnel is no longer open. He has some slough accumulation on the dorsal part of his foot as well as slough and callus buildup on the plantar surface. His MRI is scheduled for June 29. No purulent drainage or malodor appreciated today. 07/19/2021: The lateral foot wound has closed and there is no tunnel connecting the plantar foot wound to that site. The plantar foot wound still probes quite deeply, however. There is some slough, eschar, and nonviable tissue accumulated in the wound bed. No malodorous drainage present. His MRI was performed yesterday and is consistent with fairly extensive  osteomyelitis. 07/29/2021: The patient has an appointment with infectious disease on July 18 to treat his osteomyelitis. The plantar wound still probes quite deeply, approaching bone. The orifice has narrowed quite substantially, however, making it more difficult for him to pack. 08/05/2021: The tunnel connecting the lateral foot wound to the plantar foot wound has reopened. He sees infectious disease tomorrow to discuss long-term antibiotic treatment for osteomyelitis. There was a bit of murky drainage in the wound, but this was noted after he had had topical lidocaine applied so may have just been a blob of the anesthetic. No odor or frank purulent drainage. The wound probes deeply at the midfoot approaching bone. He does have MRI results confirming his diagnosis of osteomyelitis. He has failed to progress with conventional  treatment and I think his best chance for preservation of the foot is to initiate hyperbaric oxygen therapy. 08/13/2021: The tunnel connecting the 2 wounds has closed again. He has a PICC line and is getting IV daptomycin and cefepime. EKG and chest x-ray are within normal limits. He is scheduled to start hyperbaric oxygen therapy tomorrow. The wound in his midfoot probes deeply, approaching bone. The skin at the orifice continues to heaped up and threatens to close over despite the large cavity within. No erythema, induration, or purulent drainage. The wound on his lateral foot is fairly small and quite clean. 08/19/2021: The lateral foot wound has nearly closed. The wound in his midfoot does not probe quite as deeply today. The skin at the orifice continues to try to roll in and obscure the opening. No frankly necrotic tissue appreciated. He is tolerating hyperbaric oxygen therapy. 08/26/2021: The lateral foot wound has closed completely. The wound in his midfoot is shallower again today. The wound orifice is contracting. We are using gentamicin and silver alginate. He continues on  IV daptomycin and cefepime and is tolerating hyperbaric oxygen therapy without difficulty. 09/02/2021: His foot is a little bit sore today but he was up walking on it all weekend doing back to school shopping with his daughter. The tunneling is down to about 3 cm and I do not appreciate bone at the tip of the probe. The wound is clean but has some callus creating an overhanging lip at the distal aspect. He continues to receive hyperbaric oxygen therapy as well as IV daptomycin and cefepime. 09/09/2021: His wound continues to improve. The tunnel has come in by 0.9 cm. He continues to form callus around the orifice of the wound. He is tolerating hyperbaric oxygen therapy along with his IV antibiotics. 09/16/2021: The tunneling is down to just half a centimeter. He continues to build up callus around the orifice of the wound. He completed his course of IV antibiotics and his PICC line is scheduled to be removed this Friday. He is tolerating hyperbaric oxygen without difficulty. We have been applying topical gentamicin and silver alginate to his wound. 09/24/2021: The wound depth has come in even further. There is a little bit of callus buildup around the orifice. The opening is quite narrow, at this point. 09/30/2021: The wound depth is about the same this week. The opening continues to contract with callus accumulation. There is some discoloration of the skin on the lateral part of his foot and he admitted to walking more than usual this weekend and also wearing a different pair shoes. He continues to tolerate hyperbaric [name], [name] ([id number]) [id number] Page 8 of 11 oxygen therapy without difficulty. 10/07/2021: The wound depth has contracted a bit and is now approximately 1 to 1.5 cm. The orifice of the wound continues to try and close in over the space. There is just some callus and skin around the margins obscuring the orifice. 10/14/2021: The wound depth has come in a  little bit more. The orifice of the wound is rolling inwards with epithelium and callus, making it difficult to pack the wound. 10/21/2021: There is still 1 portion of the wound, at the lateral aspect, that is still a couple of centimeters deep. The architecture of the wound makes this somewhat challenging to access and pack. Everything else looks clean. He has his usual accumulation of callus and skin, narrowing the orifice. 10/29/2021: No significant change to the depth of the wound. The orifice continues to contract with  callus and skin narrowing the opening. His wife has been packing the wound and doing an excellent job. 11/05/2021: The depth of the wound has come in by about a centimeter. There is less callus accumulation around the orificeoodespite this, the wound is narrower today. We are still awaiting insurance approval for Kerecis powder. 11/12/2021: The depth of the wound continues to contract. He has accumulated some callus around the orifice, as usual. We have received a verbal confirmation that he is approved for Kerecis, but no formal paperwork has yet been received. 11/19/2021: The depth continues to come in. There is callus accumulation around the orifice, as usual. He has been formally approved for Kerecis, but unfortunately we do not have a billing mechanism for the powder in [employer] yet. We are working to address this so we can begin treatment. 11/25/2021: Continued filling in of the depth of the wound. Continued buildup of callus around the orifice. No concern for infection. As we do not have a way to bill for Kerecis powder, but are capable of billing for the [facility] sheet, we are going to use that on him instead. 12/03/2021: The depth of the wound continues to fill in. As usual, he has accumulated callus and skin around the orifice of the wound. No concern for infection. He will complete his hyperbaric oxygen therapy this week. Patient History Information obtained from  Patient. Social History Never smoker, Marital Status - Married, Alcohol Use - Never, Drug Use - No History, Caffeine Use - Daily - T coffee. ea; Medical History Endocrine Patient has history of Type II Diabetes Hospitalization/Surgery History - Amuptation of 3rd,4th and 5th toes of Right foot;Oral Surgery;Anal Fissure surgery; Cholecystectomy. Objective Constitutional Hypertensive, asymptomatic. No acute distress.. Vitals Time Taken: 7:52 AM, Height: 72 in, Weight: 312 lbs, BMI: 42.3, Temperature: 98.9 F, Pulse: 86 bpm, Respiratory Rate: 16 breaths/min, Blood Pressure: 158/85 mmHg. Respiratory Normal work of breathing on room air.. General Notes: 12/03/2021: The depth of the wound continues to fill in. As usual, he has accumulated callus and skin around the orifice of the wound. No concern for infection. Integumentary (Hair, Skin) Wound #2 status is Open. Original cause of wound was Gradually Appeared. The date acquired was: 12/19/2016. The wound has been in treatment 21 weeks. The wound is located on the [place]. The wound measures 0.3cm length x 1.5cm width x 0.8cm depth; 0.353cm^2 area and 0.283cm^3 volume. There is Fat Layer (Subcutaneous Tissue) exposed. There is no tunneling or undermining noted. There is a medium amount of serosanguineous drainage noted. The wound margin is thickened. There is large (67-100%) red granulation within the wound bed. There is a small (1-33%) amount of necrotic tissue within the wound bed including Adherent Slough. The periwound skin appearance had no abnormalities noted for moisture. The periwound skin appearance had no abnormalities noted for color. The periwound skin appearance exhibited: Callus. Periwound temperature was noted as No Abnormality. 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 There was a Selective/Open Wound Skin/Epidermis Debridement with a total area of 0.45 sq cm performed by [name], MD. With the following instrument(s): Curette to remove Non-Viable  tissue/material. Material removed includes Callus and Skin: Epidermis and after achieving pain control using Lidocaine 4% Topical Solution. No specimens were taken. A time out was conducted at 08:03, prior to the start of the procedure. A Minimum amount of bleeding was controlled with Pressure. The procedure was tolerated well. Post Debridement Measurements: 0.3cm length x 1.5cm width x 0.8cm depth; 0.283cm^3 volume. Character of Wound/Ulcer Post Debridement is improved. Severity of Tissue Post Debridement is: Fat layer exposed. Post procedure Diagnosis Wound #2: Same as Pre-Procedure General Notes: scribed for [name] by Adline Peals, RN. Pre-procedure diagnosis of Wound #2 is a Diabetic Wound/Ulcer of the Lower Extremity located on the Right,Lateral,Plantar Foot. A skin graft procedure using a bioengineered skin substitute/cellular or tissue based product was performed by [name], MD with the following instrument(s): Forceps and Scissors. Kerecis Omega3 was applied and secured with Steri-Strips. 7 sq cm of product was utilized and 14 sq cm was wasted due to wound size. Post Application, adaptic was applied. A Time Out was conducted at 08:07, prior to the start of the procedure. The procedure was tolerated well with a pain level of 0 throughout and a pain level of 0 following the procedure. Post procedure Diagnosis Wound #2: Same as Pre-Procedure General Notes:  scribed for [name] by Adline Peals, RN. Plan Follow-up Appointments: Return Appointment in 1 week. - Dr Celine Ahr Room 2 Anesthetic: Wound #2 Right,Lateral,Plantar Foot: (In clinic) Topical Lidocaine 4% applied to wound bed Cellular or Tissue Based Products: Cellular or Tissue Based Product Type: - Kerecis #2 Cellular or Tissue Based Product applied to wound bed, secured with steri-strips, cover with Adaptic or Mepitel. (DO NOT REMOVE). Bathing/ Shower/ Hygiene: May shower with protection but do not get wound dressing(s) wet. Off-Loading: Other: - Try to not stand on your feet too much Hyperbaric Oxygen Therapy: Evaluate for HBO Therapy Indication: - wagner grade 3 diabetic foot ulcer right foot 2.0 ATA for 90 Minutes without Air Breaks T Number of Treatments: - 40 more treatments for a total of 80 09/30/21 otal One treatments per day (delivered Monday through Friday unless otherwise specified in Special Instructions below): Finger stick Blood Glucose Pre- and Post- HBOT Treatment. Follow Hyperbaric Oxygen Glycemia Protocol Afrin (Oxymetazoline HCL) 0.05% nasal spray - 1 spray in both nostrils daily as needed prior to HBO treatment for difficulty clearing ears The following medication(s) was prescribed: lidocaine topical 4 % cream cream topical was prescribed at facility WOUND #2: - Foot Wound Laterality: Plantar, Right, Lateral Cleanser: Soap and Water Every Other Day/15 Days Discharge Instructions: May shower and wash wound with dial antibacterial soap and water prior to dressing change. Cleanser: Wound Cleanser (Generic) Every Other Day/15 Days Discharge Instructions: Cleanse the wound with wound cleanser prior to applying a clean dressing using gauze sponges, not tissue or cotton balls. Peri-Wound Care: cotton tipped applicators (Generic) Every Other Day/15 Days Prim Dressing: kerecis Every Other Day/15 Days ary Discharge Instructions: do not remove Secondary Dressing:  Woven Gauze Sponge, Non-Sterile 4x4 in Every Other Day/15 Days Discharge Instructions: Apply over primary dressing as directed. Secured With: Paper T ape, 2x10 (in/yd) (Generic) Every Other Day/15 Days Discharge Instructions: Secure dressing with tape as directed. Com pression Wrap: Kerlix Roll 4.5x3.1 (in/yd) (Generic) Every Other Day/15 Days Discharge Instructions: Apply Kerlix and Coban compression as directed. 12/03/2021: The depth of the wound continues to fill in. As usual, he has accumulated callus and skin around the orifice of the wound. No concern for infection. I used a  curette to debride the callus and skin from around the orifice of the wound. I then prepared the Kerecis skin substitute by moistening it with saline. A piece of an appropriate size was cut and packed into the wound. It was secured in place with Adaptic and Steri-Strips. A gauze sponge was used as a bolster. He will complete his hyperbaric oxygen therapy this week. Due to scheduling and being out of town, he will follow-up with me in 2 weeks' time. Electronic Signature(s) Signed: 12/03/2021 8:32:27 AM By: [name] MD FACS Entered By: [name] on 12/03/2021 08:32:26 [name], [name] ([id number]) [id number] Page 10 of 11 -------------------------------------------------------------------------------- HxROS Details Patient Name: Date of Service: [name], [name] 12/03/2021 7:45 [name] Medical Record Number: [id number] Patient Account Number: [id number] Date of Birth/Sex: Treating RN: 10-05-1970 (51 y.o. [name] Primary Care Provider: [name] Other Clinician: Referring Provider: Treating Provider/Extender: [name] in Treatment: 21 Information Obtained From Patient Endocrine Medical History: Positive for: Type II Diabetes Immunizations Pneumococcal Vaccine: Received Pneumococcal Vaccination: Yes Received Pneumococcal Vaccination On or  After 60th Birthday: No Implantable Devices None Hospitalization / Surgery History Type of Hospitalization/Surgery Amuptation of 3rd,4th and 5th toes of Right foot;Oral Surgery;Anal Fissure surgery; Cholecystectomy Family and Social History Never smoker; Marital Status - Married; Alcohol Use: Never; Drug Use: No History; Caffeine Use: Daily - T coffee; Financial Concerns: No; Food, ea; Clothing or Shelter Needs: No; Support System Lacking: No; Transportation Concerns: No Electronic Signature(s) Signed: 12/03/2021 12:07:49 PM By: [name] MD FACS Entered By: [name] on 12/03/2021 08:23:03 -------------------------------------------------------------------------------- SuperBill Details Patient Name: Date of Service: [name], [name] 12/03/2021 Medical Record Number: [id number] Patient Account Number: [id number] Date of Birth/Sex: Treating RN: [birth year] (51 y.o. [name] Primary Care Provider: [name] Other Clinician: Referring Provider: Treating Provider/Extender: [name] in Treatment: 21 Diagnosis Coding ICD-10 Codes Code Description E11.49 Type 2 diabetes mellitus with other diabetic neurological complication A07.622 Other chronic osteomyelitis, right ankle and foot L97.514 Non-pressure chronic ulcer of other part of right foot with necrosis of bone Facility Procedures : [name], E CPT4 Code: [id number] DWARD [name] ([id number] Description: [address] [place] 1.75x1.75 - 4qty ) [id number] Modifier: [id number] Quantity: 21 [id number].pdf Page 11 of 11 : CPT4 Code: [id number] 1 Description: 5275 - SKIN SUB GRAFT FACE/NK/HF/G ICD-10 Diagnosis Description L97.514 Non-pressure chronic ulcer of other part of right foot with necrosis of bone Modifier: Quantity: 1 Physician Procedures : CPT4 Code Description Modifier [id number] 03704 - WC PHYS LEVEL 3 - EST PT 25 ICD-10 Diagnosis Description L97.514 Non-pressure chronic ulcer of  other part of right foot with necrosis of bone M86.671 Other chronic osteomyelitis, right ankle and foot  E11.49 Type 2 diabetes mellitus with other diabetic neurological complication Quantity: 1 : [id number] 15275 - WC PHYS SKIN SUB GRAFT FACE/NK/HF/G ICD-10 Diagnosis Description L97.514 Non-pressure chronic ulcer of 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

## 2021-12-03 NOTE — Progress Notes (Signed)
[name], Edna [name] ([id number]) [id number] Page 1 of 2 Visit Report for 12/03/2021 Arrival Information Details Patient Name: Date of Service: [place], [place] [name] 12/03/2021 8:30 A M Medical Record Number: [id number] Patient Account Number: [id number] Date of Birth/Sex: Treating RN: 10-Apr-1970 (51 y.o. [name], [name] Primary Care Krystena Reitter: [name] Other Clinician: [name] Referring Samyah Bilbo: Treating Ramon Zanders/Extender: [name] in Treatment: 21 Visit Information History Since Last Visit All ordered tests and consults were completed: Yes Patient Arrived: Ambulatory Added or deleted any medications: No Arrival Time: 07:47 Any new allergies or adverse reactions: No Accompanied By: None Had a fall or experienced change in No Transfer Assistance: None activities of daily living that may affect Patient Identification Verified: Yes risk of falls: Secondary Verification Process Completed: Yes Signs or symptoms of abuse/neglect since last visito No Patient Requires Transmission-Based Precautions: No Hospitalized since last visit: No Patient Has Alerts: Yes Implantable device outside of the clinic excluding No cellular tissue based products placed in the center since last visit: Pain Present Now: No Electronic Signature(s) Signed: 12/03/2021 2:47:30 PM By: [name] EMT Entered By: [name] on 12/03/2021 14:47:30 -------------------------------------------------------------------------------- Encounter Discharge Information Details Patient Name: Date of Service: [name], [name] 12/03/2021 8:30 A M Medical Record Number: [id number] Patient Account Number: [id number] Date of Birth/Sex: Treating RN: [birth year] (51 y.o. [name] Primary Care Kiondre Grenz: [name] Other Clinician: [name] Referring Jesua Tamblyn: Treating Pansie Guggisberg/Extender: [name] in  Treatment: 21 Encounter Discharge Information Items Discharge Condition: Stable Ambulatory Status: Ambulatory Discharge Destination: Home Transportation: Private Auto Accompanied By: None Schedule Follow-up Appointment: Yes Clinical Summary of Care: Electronic Signature(s) Signed: 12/03/2021 2:50:58 PM By: [name] EMT Entered By: [name] on 12/03/2021 14:50:57 [name], Shamir M ([id number]) [id number] Page 2 of 2 -------------------------------------------------------------------------------- Vitals Details Patient Name: Date of Service: [name], [name] 12/03/2021 8:30 A M Medical Record Number: [id number] Patient Account Number: [id number] Date of Birth/Sex: Treating RN: 1970/04/24 (51 y.o. [name], [name] Primary Care Porshia Blizzard: [name] Other Clinician: [name] Referring Dhiren Azimi: Treating Krina Mraz/Extender: [name] in Treatment: 21 Vital Signs Time Taken: 08:20 Temperature (F): 98.1 Height (in): 72 Pulse (bpm): 85 Weight (lbs): 312 Respiratory Rate (breaths/min): 16 Body Mass Index (BMI): 42.3 Blood Pressure (mmHg): 150/91 Reference Range: 80 - 120 mg / dl Electronic Signature(s) Signed: 12/03/2021 2:48:05 PM By: [name] EMT Entered By: [name] on 12/03/2021 14:48:04

## 2021-12-03 NOTE — Progress Notes (Signed)
[name], Declin Trinity ([id number]) [id number] Page 1 of 7 Visit Report for 12/03/2021 Arrival Information Details Patient Name: Date of Service: [place], [place] [name] 12/03/2021 7:45 A M Medical Record Number: [id number] Patient Account Number: [id number] Date of Birth/Sex: Treating RN: [birth year] (51 y.o. [name] Primary Care [name]: [name] Other Clinician: Referring Lenix Kidd: Treating Uriyah Massimo/Extender: [name] in Treatment: 21 Visit Information History Since Last Visit Added or deleted any medications: No Patient Arrived: Ambulatory Any new allergies or adverse reactions: No Arrival Time: 07:49 Had a fall or experienced change in No Accompanied By: self activities of daily living that may affect Transfer Assistance: None risk of falls: Patient Identification Verified: Yes Signs or symptoms of abuse/neglect since last visito No Secondary Verification Process Completed: Yes Hospitalized since last visit: No Patient Requires Transmission-Based Precautions: No Implantable device outside of the clinic excluding No Patient Has Alerts: Yes cellular tissue based products placed in the center since last visit: Has Dressing in Place as Prescribed: Yes Pain Present Now: Yes Electronic Signature(s) Signed: 12/03/2021 3:48:01 PM By: Adline Peals Entered By: Adline Peals on 12/03/2021 07:51:59 -------------------------------------------------------------------------------- Encounter Discharge Information Details Patient Name: Date of Service: [name], [name] 12/03/2021 7:45 A M Medical Record Number: [id number] Patient Account Number: [id number] Date of Birth/Sex: Treating RN: 1970/09/02 (51 y.o. [name] Primary Care Margurite Duffy: [name] Other Clinician: Referring Maygan Koeller: Treating Enrika Aguado/Extender: [name] in Treatment: 21 Encounter Discharge  Information Items Post Procedure Vitals Discharge Condition: Stable Temperature (F): 98.9 Ambulatory Status: Ambulatory Pulse (bpm): 86 Discharge Destination: Home Respiratory Rate (breaths/min): 16 Transportation: Private Auto Blood Pressure (mmHg): 158/85 Accompanied By: self Schedule Follow-up Appointment: Yes Clinical Summary of Care: Patient Declined Electronic Signature(s) Signed: 12/03/2021 3:48:01 PM By: Adline Peals Entered By: Adline Peals on 12/03/2021 08:26:24 [name], Hannibal M ([id number]) [id number] Page 2 of 7 -------------------------------------------------------------------------------- Lower Extremity Assessment Details Patient Name: Date of Service: [name], [name] 12/03/2021 7:45 A M Medical Record Number: [id number] Patient Account Number: [id number] Date of Birth/Sex: Treating RN: Nov 14, 1970 (51 y.o. [name] Primary Care Izan Miron: [name] Other Clinician: Referring Meadow Abramo: Treating Eural Holzschuh/Extender: [name] in Treatment: 21 Edema Assessment Assessed: [name]: No] [name]: No] Edema: [Left: N] [Right: o] Calf Left: Right: Point of Measurement: From Medial Instep 39.5 cm Ankle Left: Right: Point of Measurement: From Medial Instep 24.4 cm Electronic Signature(s) Signed: 12/03/2021 3:48:01 PM By: Adline Peals Entered By: Adline Peals on 12/03/2021 07:55:54 -------------------------------------------------------------------------------- Multi Wound Chart Details Patient Name: Date of Service: [name], [name] 12/03/2021 7:45 A M Medical Record Number: [id number] Patient Account Number: [id number] Date of Birth/Sex: Treating RN: 04/19/1970 (51 y.o. M) Primary Care Sedric Guia: [name] Other Clinician: Referring Vimal Derego: Treating Bindi Klomp/Extender: [name] in Treatment: 21 Vital Signs Height(in): 72 Pulse(bpm):  86 Weight(lbs): 312 Blood Pressure(mmHg): 158/85 Body Mass Index(BMI): 42.3 Temperature(F): 98.9 Respiratory Rate(breaths/min): 16 [2:Photos:] [N/A:N/A] Right, Lateral, Plantar Foot N/A N/A Wound Location: Gradually Appeared N/A N/A Wounding Event: Diabetic Wound/Ulcer of the Lower N/A N/A Primary Etiology: Extremity Type II Diabetes N/A N/A Comorbid History: 12/19/2016 N/A N/A Date Acquired: [name], Irby M ([id number]) [id number] Page 3 of 7 21 N/A N/A Weeks of Treatment: Open N/A N/A Wound Status: No N/A N/A Wound Recurrence: 0.3x1.5x0.8 N/A N/A Measurements L x W x D (cm) 0.353 N/A N/A A (cm) : rea 0.283 N/A N/A Volume (cm) : 90.20% N/A N/A % Reduction in A  rea: 98.40% N/A N/A % Reduction in Volume: Grade 3 N/A N/A Classification: Medium N/A N/A Exudate A mount: Serosanguineous N/A N/A Exudate Type: red, brown N/A N/A Exudate Color: Thickened N/A N/A Wound Margin: Large (67-100%) N/A N/A Granulation A mount: Red N/A N/A Granulation Quality: Small (1-33%) N/A N/A Necrotic A mount: Fat Layer (Subcutaneous Tissue): Yes N/A N/A Exposed Structures: Fascia: No Tendon: No Muscle: No Joint: No Bone: No Small (1-33%) N/A N/A Epithelialization: Debridement - Selective/Open Wound N/A N/A Debridement: Pre-procedure Verification/Time Out 08:03 N/A N/A Taken: Lidocaine 4% Topical Solution N/A N/A Pain Control: Callus N/A N/A Tissue Debrided: Skin/Epidermis N/A N/A Level: 0.45 N/A N/A Debridement A (sq cm): rea Curette N/A N/A Instrument: Minimum N/A N/A Bleeding: Pressure N/A N/A Hemostasis A chieved: Procedure was tolerated well N/A N/A Debridement Treatment Response: 0.3x1.5x0.8 N/A N/A Post Debridement Measurements L x W x D (cm) 0.283 N/A N/A Post Debridement Volume: (cm) Callus: Yes N/A N/A Periwound Skin Texture: No Abnormalities Noted N/A N/A Periwound Skin Moisture: No Abnormalities Noted N/A  N/A Periwound Skin Color: No Abnormality N/A N/A Temperature: Cellular or Tissue Based Product N/A N/A Procedures Performed: Debridement Treatment Notes Electronic Signature(s) Signed: 12/03/2021 8:22:01 AM By: [name] MD FACS Entered By: [name] on 12/03/2021 08:22:01 -------------------------------------------------------------------------------- Multi-Disciplinary Care Plan Details Patient Name: Date of Service: [name], [name] 12/03/2021 7:45 A M Medical Record Number: [id number] Patient Account Number: [id number] Date of Birth/Sex: Treating RN: [birth year] (51 y.o. [name] Primary Care Randell Teare: [name] Other Clinician: Referring Rosamaria Donn: Treating Alrick Cubbage/Extender: [name] in Treatment: Shirley reviewed with physician Active Inactive Nutrition Nursing Diagnoses: Impaired glucose control: actual or potential Potential for alteratiion in Nutrition/Potential for imbalanced nutrition Goals: [name], Kimari M ([id number]) [id number] Page 4 of 7 Patient/caregiver will maintain therapeutic glucose control Date Initiated: 07/29/2021 Target Resolution Date: 12/20/2021 Goal Status: Active Interventions: Assess patient nutrition upon admission and as needed per policy Provide education on elevated blood sugars and impact on wound healing Treatment Activities: Patient referred to Primary Care Physician for further nutritional evaluation : 07/29/2021 Notes: Osteomyelitis Nursing Diagnoses: Infection: osteomyelitis Knowledge deficit related to disease process and management Goals: Patient/caregiver will verbalize understanding of disease process and disease management Date Initiated: 07/29/2021 Date Inactivated: 11/12/2021 Target Resolution Date: 11/19/2021 Goal Status: Met Patient's osteomyelitis will resolve Date Initiated: 07/29/2021 Target Resolution Date:  12/20/2021 Goal Status: Active Interventions: Assess for signs and symptoms of osteomyelitis resolution every visit Provide education on osteomyelitis Treatment Activities: Consult for HBO : 07/29/2021 Notes: Wound/Skin Impairment Nursing Diagnoses: Impaired tissue integrity Goals: Patient/caregiver will verbalize understanding of skin care regimen Date Initiated: 07/04/2021 Target Resolution Date: 12/20/2021 Goal Status: Active Interventions: Assess ulceration(s) every visit Treatment Activities: Skin care regimen initiated : 07/04/2021 Notes: Electronic Signature(s) Signed: 12/03/2021 3:48:01 PM By: Adline Peals Entered By: Adline Peals on 12/03/2021 08:03:08 -------------------------------------------------------------------------------- Pain Assessment Details Patient Name: Date of Service: [name], [name] 12/03/2021 7:45 A M Medical Record Number: [id number] Patient Account Number: [id number] Date of Birth/Sex: Treating RN: 26-Dec-1970 (51 y.o. [name] Primary Care Mireya Meditz: [name] Other Clinician: Referring Kameryn Tisdel: Treating Hisae Decoursey/Extender: [name] in Treatment: [address] [name], [place] Jerilynn Mages ([id number]) [id number] Page 5 of 7 Location of Pain Severity and Description of Pain Patient Has Paino Yes Site Locations Pain Location: Pain in Ulcers Duration of the Pain. Constant / Intermittento Intermittent Rate the pain. Current Pain Level: 1 Character of Pain Describe the  Pain: Other: pressure Pain Management and Medication Current Pain Management: Electronic Signature(s) Signed: 12/03/2021 3:48:01 PM By: Adline Peals Entered By: Adline Peals on 12/03/2021 07:52:50 -------------------------------------------------------------------------------- Patient/Caregiver Education Details Patient Name: Date of Service: Horald [place], [name] 11/14/2023andnbsp7:45 A  M Medical Record Number: [id number] Patient Account Number: [id number] Date of Birth/Gender: Treating RN: 10-Jun-1970 (50 y.o. [name] Primary Care Physician: [name] Other Clinician: Referring Physician: Treating Physician/Extender: [name] in Treatment: 21 Education Assessment Education Provided To: Patient Education Topics Provided Wound/Skin Impairment: Methods: Explain/Verbal Responses: Reinforcements needed, State content correctly [employer]) Signed: 12/03/2021 3:48:01 PM By: Adline Peals Entered By: Adline Peals on 12/03/2021 08:05:10 [name], Nasri M ([id number]) [id number] Page 6 of 7 -------------------------------------------------------------------------------- Wound Assessment Details Patient Name: Date of Service: [name], [name] 12/03/2021 7:45 A M Medical Record Number: [id number] Patient Account Number: [id number] Date of Birth/Sex: Treating RN: 09-09-1970 (51 y.o. [name] Primary Care August Gosser: [name] Other Clinician: Referring Abree Romick: Treating Berdell Nevitt/Extender: [name] in Treatment: 21 Wound Status Wound Number: 2 Primary Etiology: Diabetic Wound/Ulcer of the Lower Extremity Wound Location: Right, Lateral, Plantar Foot Wound Status: Open Wounding Event: Gradually Appeared Comorbid History: Type II Diabetes Date Acquired: 12/19/2016 Weeks Of Treatment: 21 Clustered Wound: No Photos Wound Measurements Length: (cm) 0.3 Width: (cm) 1.5 Depth: (cm) 0.8 Area: (cm) 0.353 Volume: (cm) 0.283 % Reduction in Area: 90.2% % Reduction in Volume: 98.4% Epithelialization: Small (1-33%) Tunneling: No Undermining: No Wound Description Classification: Grade 3 Wound Margin: Thickened Exudate Amount: Medium Exudate Type: Serosanguineous Exudate Color: red, brown Foul Odor After Cleansing:  No Slough/Fibrino Yes Wound Bed Granulation Amount: Large (67-100%) Exposed Structure Granulation Quality: Red Fascia Exposed: No Necrotic Amount: Small (1-33%) Fat Layer (Subcutaneous Tissue) Exposed: Yes Necrotic Quality: Adherent Slough Tendon Exposed: No Muscle Exposed: No Joint Exposed: No Bone Exposed: No Periwound Skin Texture Texture Color No Abnormalities Noted: No No Abnormalities Noted: Yes Callus: Yes Temperature / Pain Temperature: No Abnormality Moisture No Abnormalities Noted: Yes Treatment Notes Wound #2 (Foot) Wound Laterality: Plantar, Right, Lateral Cleanser Soap and Water Discharge Instruction: May shower and wash wound with dial antibacterial soap and water prior to dressing change. Wound Cleanser Discharge Instruction: Cleanse the wound with wound cleanser prior to applying a clean dressing using gauze sponges, not tissue or cotton balls. Peri-Wound Care [name], Mattis Lucas ([id number]) [id number] Page 7 of 7 cotton tipped applicators Topical Primary Dressing kerecis Discharge Instruction: do not remove Secondary Dressing Woven Gauze Sponge, Non-Sterile 4x4 in Discharge Instruction: Apply over primary dressing as directed. Secured With Paper Tape, 2x10 (in/yd) Discharge Instruction: Secure dressing with tape as directed. Compression Wrap Kerlix Roll 4.5x3.1 (in/yd) Discharge Instruction: Apply Kerlix and Coban compression as directed. Compression Stockings Add-Ons Electronic Signature(s) Signed: 12/03/2021 3:48:01 PM By: Adline Peals Entered By: Adline Peals on 12/03/2021 07:58:58 -------------------------------------------------------------------------------- Vitals Details Patient Name: Date of Service: [name], [name] 12/03/2021 7:45 A M Medical Record Number: [id number] Patient Account Number: [id number] Date of Birth/Sex: Treating RN: 1970-07-03 (51 y.o. [name] Primary Care Armari Fussell:  [name] Other Clinician: Referring Lilyannah Zuelke: Treating Eulamae Greenstein/Extender: [name] in Treatment: 21 Vital Signs Time Taken: 07:52 Temperature (F): 98.9 Height (in): 72 Pulse (bpm): 86 Weight (lbs): 312 Respiratory Rate (breaths/min): 16 Body Mass Index (BMI): 42.3 Blood Pressure (mmHg): 158/85 Reference Range: 80 - 120 mg / dl Electronic Signature(s) Signed: 12/03/2021 3:48:01 PM By: Adline Peals Entered By: Adline Peals on  12/03/2021 07:52:28

## 2021-12-03 NOTE — Progress Notes (Signed)
Phone [phone] In person visit   Subjective:   [name] is a 51 y.o. year old very pleasant male patient who presents for/with See problem oriented charting Chief Complaint  Patient presents with   Follow-up   Hyperlipidemia   Hypertension   Diabetes    Past Medical History-  Patient Active Problem List   Diagnosis Date Noted   Morbid obesity ([place]) 03/08/2015    Priority: High   Type II diabetes mellitus with neurological manifestations ([place]) 03/07/2015    Priority: High   Diabetic retinopathy ([place]) 01/25/2018    Priority: Medium    Diabetic neuropathy ([place]) 11/17/2016    Priority: Medium    Elevated LFTs     Priority: Medium    Hyperlipidemia associated with type 2 diabetes mellitus ([place]) 03/08/2015    Priority: Medium    Essential hypertension 03/08/2015    Priority: Medium    Charcot's joint of foot, right 11/18/2019    Priority: Low   Status post peripherally inserted central catheter (PICC) central line placement 12/08/2016    Priority: Low   B12 deficiency 11/17/2016    Priority: Low   Iron deficiency anemia 11/17/2016    Priority: Low   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 ([place]) 11/17/2016    Priority: Low   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 ([place]) 11/17/2016    Priority: Low   Melena and acute on chronic blood loss anemia 11/07/2021   Pituitary adenoma ([place]) 11/07/2021   PVD (peripheral vascular disease) ([place]) 11/07/2021   Symptomatic anemia 11/06/2021   Ulcer of right foot with necrosis of bone ([place])    Osteomyelitis ([place]) 08/31/2020   Diabetic foot ulcer ([place]) 11/18/2019   Pyogenic inflammation of bone ([place]) 11/09/2019   Osteomyelitis of ankle or foot, acute, right ([place]) 11/09/2019   Diabetic foot infection ([name]) 07/29/2016    Medications- reviewed and updated Current Outpatient Medications  Medication Sig Dispense Refill   amoxicillin-clavulanate (AUGMENTIN) 875-125 MG tablet  Take 1 tablet by mouth 2 (two) times daily.     ascorbic acid (VITAMIN C) 500 MG tablet Take 500 mg by mouth daily.     aspirin EC 81 MG tablet Take 81 mg by mouth daily.     atorvastatin (LIPITOR) 80 MG tablet Take 1 tablet (80 mg total) by mouth daily. 90 tablet 3   Cyanocobalamin (VITAMIN B-12) 5000 MCG SUBL Place 5,000 mcg under the tongue daily.     fenofibrate (TRICOR) 48 MG tablet TAKE 1 TABLET BY MOUTH EVERY DAY (Patient taking differently: Take 48 mg by mouth daily.) 90 tablet 4   glucose blood (ACCU-CHEK GUIDE) test strip Dx E11.49 - Use to check blood glucose three times daily or as directed. 300 each 1   hydrochlorothiazide (MICROZIDE) 12.5 MG capsule TAKE 1 CAPSULE BY MOUTH EVERY DAY IN THE MORNING (Patient taking differently: Take 12.5 mg by mouth daily. TAKE 1 CAPSULE BY MOUTH EVERY DAY IN THE MORNING) 100 capsule 3   insulin aspart (NOVOLOG) 100 UNIT/ML FlexPen Inject 15-25 Units into the skin 3 (three) times daily with meals. Please provide pen needles if possible (Patient taking differently: Inject 15-25 Units into the skin 3 (three) times daily with meals. Per sliding scale) 45 mL 3   Insulin Syringe-Needle U-100 (BD INSULIN SYRINGE U/F) 31G X 5/16" 0.5 ML MISC Use to inject insulin 4 times daily. DX:E11.9 100 each 4   Insulin Syringe-Needle U-100 (BD INSULIN SYRINGE U/F)  31G X 5/16" 1 ML MISC Use to inject tresiba/long acting insulin once daily 100 each 3   Insulin Syringe-Needle U-100 (BD VEO INSULIN SYR U/F 1/2UNIT) 31G X 15/64" 0.3 ML MISC USE TO INJECT INSULIN FOUR TIMES DAILY AS DIRECTED 300 each 2   JANUVIA 50 MG tablet TAKE 1 TABLET BY MOUTH EVERY DAY IN THE MORNING (Patient taking differently: Take 50 mg by mouth daily.) 90 tablet 1   Lancets (ONETOUCH ULTRASOFT) lancets Use to test blood sugars daily. Dx: E11.9 100 each 12   Lancets MISC USED TO CHECK BLOOD GLUCOSE 3-4 TIMES DAILY 200 each 3   Multiple Vitamin (MULTIVITAMIN WITH MINERALS) TABS tablet Take 1 tablet by mouth  2 (two) times daily.     Omega-3 Fatty Acids (FISH OIL) 1360 MG CAPS Take 1,360 mg by mouth daily.      pyridoxine (B-6) 100 MG tablet Take 100 mg by mouth 2 (two) times daily.     ramipril (ALTACE) 5 MG capsule TAKE 1 CAPSULE BY MOUTH EVERY DAY IN THE MORNING (Patient taking differently: Take 5 mg by mouth daily.) 90 capsule 4   insulin degludec (TRESIBA FLEXTOUCH) 200 UNIT/ML FlexTouch Pen Inject 80-100 Units into the skin daily. 15 mL 3   No current facility-administered medications for this visit.     Objective:  BP 128/78   Pulse 82   Temp 98.7 F (37.1 C)   Ht 6' (1.829 m)   Wt (!) 308 lb (139.7 kg)   SpO2 98%   BMI 41.77 kg/m  Gen: NAD, resting comfortably CV: RRR no murmurs rubs or gallops Lungs: CTAB no crackles, wheeze, rhonchi Ext: trace edema Skin: warm, dry    Assessment and Plan   #Linezolid associated anemia-was improving at last visit.  Reassuring GI work-up-see last note.  Hope was within 3 to 4 weeks of stopping linezolid for resolution.  Hemoglobin had dropped as low as 6.7 from 9.4 just 3 weeks prior and 12.2 prior to that.  Improved to 8.8 before discharge and 9.2 at last visit - Update CBC with labs today to evaluate for continued improvement Lab Results  Component Value Date   WBC 6.4 11/11/2021   HGB 9.2 (L) 11/11/2021   HCT 28.2 (L) 11/11/2021   MCV 80.6 11/11/2021   PLT 271.0 11/11/2021   #Diabetes-typically with A1c at least under 7.5 S: Medication: Tresiba at night up to 85 units (more recently has had to use lower doses as needed blood sugar to be higher for hyperbaric treatments), NovoLog 15 to 20 units twice daily-typically about 35 total per day -Take Januvia 50 mg in the morning.  CBGs-last visit blood sugar 130-140 in the morning to allow for chamber- some variability since that time  Lab Results  Component Value Date   HGBA1C 7.5 (H) 11/11/2021   HGBA1C 6.8 (H) 05/15/2021   HGBA1C 6.5 (H) 08/31/2020  A/P: suspect will improve after  finishes hyperbaric treatments on Friday- will still see [name]- apparently wound very close to closing  # Hyperlipidemia-Ideal LDL under 70-tolerate under 100 S:Medication: Atorvastatin 60 mg daily (1 and 1/2 tablet of 40 mg) along with fenofibrate 48 mg - also prefers to take aspirin for primary prevention  Lab Results  Component Value Date   CHOL 169 05/15/2021   HDL 41.80 05/15/2021   LDLCALC 111 (H) 05/15/2021   LDLDIRECT 37.0 11/11/2021   TRIG 79.0 05/15/2021   CHOLHDL 4 05/15/2021  A/P: LDL at 37- very well controlled- continue current meds  for now- but could consider reduction   #Hypertension S: HCTZ 12.5 mg and Ramipril 5 mg  Home readings #s: no recent checks BP Readings from Last 3 Encounters:  12/03/21 128/78  11/11/21 (!) 160/86  11/08/21 (!) 166/89   A/P: Controlled. Continue current medications.   Recommended follow up: Return in about 14 weeks (around 03/11/2022) for followup or sooner if needed.Schedule b4 you leave. Future Appointments  Date Time Provider [place]  12/04/2021  8:00 AM [name], MD [facility]  12/05/2021  8:00 AM [name], MD [facility]  12/06/2021  8:00 AM [name], MD [facility]  12/16/2021 10:45 AM Mignon Pine, DO [facility]  12/17/2021  7:45 AM [name], MD [name]  12/18/2021 10:20 AM Yong Channel Brayton Mars, MD [facility]    Lab/Order associations:   ICD-10-CM   1. Anemia, unspecified type  D64.9 CBC with Differential/Platelet    2. Type II diabetes mellitus with neurological manifestations ([place])  E11.49     3. Hyperlipidemia associated with type 2 diabetes mellitus ([place])  E11.69    E78.5     4. Essential hypertension  I10     5. CKD (chronic kidney disease), stage II  N18.2       Meds ordered this encounter  Medications   insulin degludec (TRESIBA FLEXTOUCH) 200 UNIT/ML FlexTouch Pen    Sig: Inject 80-100 Units into the skin daily.    Dispense:  15 mL     Refill:  3    Return precautions advised.  [name], MD

## 2021-12-04 ENCOUNTER — Encounter (HOSPITAL_BASED_OUTPATIENT_CLINIC_OR_DEPARTMENT_OTHER): Payer: BLUE CROSS/BLUE SHIELD | Admitting: General Surgery

## 2021-12-04 DIAGNOSIS — E1149 Type 2 diabetes mellitus with other diabetic neurological complication: Secondary | ICD-10-CM | POA: Diagnosis not present

## 2021-12-04 LAB — GLUCOSE, CAPILLARY
Glucose-Capillary: 167 mg/dL — ABNORMAL HIGH (ref 70–99)
Glucose-Capillary: 204 mg/dL — ABNORMAL HIGH (ref 70–99)

## 2021-12-04 NOTE — Progress Notes (Signed)
[name], Tabari V ([id number]) [id number] Page 1 of 2 Visit Report for 12/02/2021 Problem List Details Patient Name: Date of Service: [name], [name] 12/02/2021 8:00 A M Medical Record Number: [id number] Patient Account Number: [id number] Date of Birth/Sex: Treating RN: May 14, 1970 (51 y.o. 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 12/02/2021 Medical Record Number: [id number] Patient Account Number: [id number] Date of Birth/Sex: Treating RN: [birth year] (51 y.o. 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 Y04.599 Other chronic osteomyelitis, right ankle and foot L97.514 Non-pressure chronic ulcer of other part of right foot with necrosis of bone [name], Boone M ([id number]) [id number] Page 2  of 2 Facility Procedures : CPT4 Code Description: [id number] G0277-(Facility Use Only) HBOT full body chamber, 18mn , ICD-10 Diagnosis Description 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 L97.514 Non-pressure chronic ulcer of other part of right foo M86.671  Other chronic osteomyelitis, right ankle and foot Modifier: ical complication t with necrosis o Quantity: 4 f bone Physician Procedures : CPT4 Code Description Modifier [id number] 37290- WC PHYS HYPERBARIC OXYGEN THERAPY ICD-10 Diagnosis Description 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

## 2021-12-04 NOTE — Progress Notes (Signed)
[name], Yohan X ([id number]) [id number] Page 1 of 2 Visit Report for 12/03/2021 Problem List Details Patient Name: Date of Service: [name], [name] 12/03/2021 8:30 A M Medical Record Number: [id number] Patient Account Number: [id number] Date of Birth/Sex: Treating RN: 07/18/1970 (51 y.o. 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 12/03/2021 Medical Record Number: [id number] Patient Account Number: [id number] Date of Birth/Sex: Treating RN: 1970-05-01 (51 y.o. 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 L93.790 Other chronic osteomyelitis, right ankle and foot L97.514 Non-pressure chronic ulcer of other part of right foot with necrosis of bone [name], Emeka M ([id number]) [id number] Page 2 of  2 Facility Procedures : CPT4 Code Description: [id number] G0277-(Facility Use Only) HBOT full body chamber, 15mn , ICD-10 Diagnosis Description 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 L97.514 Non-pressure chronic ulcer of other part of right foo M86.671  Other chronic osteomyelitis, right ankle and foot Modifier: ical complication t with necrosis o Quantity: 4 f bone Physician Procedures : CPT4 Code Description Modifier [id number] 97989- WC PHYS HYPERBARIC OXYGEN THERAPY ICD-10 Diagnosis Description 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

## 2021-12-04 NOTE — Progress Notes (Signed)
[name], [name] ([id number]) [id number] Page 1 of 2 Visit Report for 12/04/2021 Arrival Information Details Patient Name: Date of Service: [name], [place] [name] 12/04/2021 8:00 [name] Medical Record Number: [id number] Patient Account Number: [id number] Date of Birth/Sex: Treating RN: 23-Apr-1970 (51 y.[name] [name] Primary Care Domitila Stetler: [name] Other Clinician: [name] Referring Dyanne Yorks: Treating Leaman Abe/Extender: [name] in Treatment: 21 Visit Information History Since Last Visit All ordered tests and consults were completed: Yes Patient Arrived: Ambulatory Added or deleted any medications: No Arrival Time: 07:40 Any new allergies or adverse reactions: No Accompanied By: None Had a fall or experienced change in No Transfer Assistance: None activities of daily living that may affect Patient Identification Verified: Yes risk of falls: Secondary Verification Process Completed: Yes Signs or symptoms of abuse/neglect since last visito No Patient Requires Transmission-Based Precautions: No Hospitalized since last visit: No Patient Has Alerts: Yes Implantable device outside of the clinic excluding No cellular tissue based products placed in the center since last visit: Pain Present Now: No Electronic Signature(s) Signed: 12/04/2021 3:11:23 PM By: [name] EMT Entered By: [name] on 12/04/2021 15:11:23 -------------------------------------------------------------------------------- Encounter Discharge Information Details Patient Name: Date of Service: [place], [name] 12/04/2021 8:00 [name] Patient Account Number: [id number] Date of Birth/Sex: Treating RN: [birth year] (51 y.[name] [name] Primary Care Shiane Wenberg: [name] Other Clinician: [name] Referring Spero Gunnels: Treating Rapheal Masso/Extender: [name] in  Treatment: 21 Encounter Discharge Information Items Discharge Condition: Stable Ambulatory Status: Ambulatory Discharge Destination: Home Transportation: Private Auto Accompanied By: None Schedule Follow-up Appointment: Yes Clinical Summary of Care: Electronic Signature(s) Signed: 12/04/2021 3:20:16 PM By: [name] EMT Entered By: [name] on 12/04/2021 15:20:16 [name], [name] ([id number]) [id number] Page 2 of 2 -------------------------------------------------------------------------------- Vitals Details Patient Name: Date of Service: [name], [name] 12/04/2021 8:00 [name] Medical Record Number: [id number] Patient Account Number: [id number] Date of Birth/Sex: Treating RN: [birth year] (51 y.[name] [name] Primary Care Zeno Hickel: [name] Other Clinician: [name] Referring Shinichi Anguiano: Treating Yarelly Kuba/Extender: [name] in Treatment: 21 Vital Signs Time Taken: 08:06 Temperature (F): 97.3 Height (in): 72 Pulse (bpm): 84 Weight (lbs): 312 Respiratory Rate (breaths/min): 16 Body Mass Index (BMI): 42.3 Blood Pressure (mmHg): 122/75 Capillary Blood Glucose (mg/dl): 167 Reference Range: 80 - 120 mg / dl Electronic Signature(s) Signed: 12/04/2021 3:11:51 PM By: [name] EMT Entered By: [name] on 12/04/2021 15:11:51

## 2021-12-05 ENCOUNTER — Encounter (HOSPITAL_BASED_OUTPATIENT_CLINIC_OR_DEPARTMENT_OTHER): Payer: BLUE CROSS/BLUE SHIELD | Admitting: General Surgery

## 2021-12-05 DIAGNOSIS — E1149 Type 2 diabetes mellitus with other diabetic neurological complication: Secondary | ICD-10-CM | POA: Diagnosis not present

## 2021-12-05 LAB — GLUCOSE, CAPILLARY
Glucose-Capillary: 146 mg/dL — ABNORMAL HIGH (ref 70–99)
Glucose-Capillary: 163 mg/dL — ABNORMAL HIGH (ref 70–99)

## 2021-12-05 NOTE — Progress Notes (Signed)
[name], Dameon H ([id number]) [id number] Page 1 of 2 Visit Report for 12/05/2021 Arrival Information Details Patient Name: Date of Service: [name], [place] [name] 12/05/2021 8:00 A M Medical Record Number: [id number] Patient Account Number: [id number] Date of Birth/Sex: Treating RN: 16-Feb-1970 (51 y.o. [name], [name] Primary Care Exavier Lina: [name] Other Clinician: [name] Referring Cinderella Christoffersen: Treating Eriyah Fernando/Extender: [name] in Treatment: 10 Visit Information History Since Last Visit All ordered tests and consults were completed: Yes Patient Arrived: Ambulatory Added or deleted any medications: No Arrival Time: 07:32 Any new allergies or adverse reactions: No Accompanied By: None Had a fall or experienced change in No Transfer Assistance: None activities of daily living that may affect Patient Identification Verified: Yes risk of falls: Secondary Verification Process Completed: Yes Signs or symptoms of abuse/neglect since last visito No Patient Requires Transmission-Based Precautions: No Hospitalized since last visit: No Patient Has Alerts: Yes Implantable device outside of the clinic excluding No cellular tissue based products placed in the center since last visit: Pain Present Now: No Electronic Signature(s) Signed: 12/05/2021 11:29:26 AM By: [name] EMT Entered By: [name] on 12/05/2021 11:29:25 -------------------------------------------------------------------------------- Encounter Discharge Information Details Patient Name: Date of Service: [place], [name] 12/05/2021 8:00 A M Medical Record Number: [id number] Patient Account Number: [id number] Date of Birth/Sex: Treating RN: [birth year] (51 y.o. [name] Primary Care Melina Mosteller: [name] Other Clinician: [name] Referring Meilyn Heindl: Treating Blaze Nylund/Extender: [name] in  Treatment: 22 Encounter Discharge Information Items Discharge Condition: Stable Ambulatory Status: Ambulatory Discharge Destination: Home Transportation: Private Auto Accompanied By: None Schedule Follow-up Appointment: Yes Clinical Summary of Care: Electronic Signature(s) Signed: 12/05/2021 1:07:00 PM By: [name] EMT Entered By: [name] on 12/05/2021 13:07:00 [name], Desmond M ([id number]) [id number] Page 2 of 2 -------------------------------------------------------------------------------- Vitals Details Patient Name: Date of Service: [name], [name] 12/05/2021 8:00 A M Medical Record Number: [id number] Patient Account Number: [id number] Date of Birth/Sex: Treating RN: [birth year] (51 y.o. [name], [name] Primary Care Jerrik Housholder: [name] Other Clinician: [name] Referring Reynol Arnone: Treating Elianna Windom/Extender: [name] in Treatment: 22 Vital Signs Time Taken: 07:56 Temperature (F): 97.9 Height (in): 72 Pulse (bpm): 80 Weight (lbs): 312 Respiratory Rate (breaths/min): 16 Body Mass Index (BMI): 42.3 Blood Pressure (mmHg): 132/84 Capillary Blood Glucose (mg/dl): 146 Reference Range: 80 - 120 mg / dl Electronic Signature(s) Signed: 12/05/2021 11:29:59 AM By: [name] EMT Entered By: [name] on 12/05/2021 11:29:59

## 2021-12-05 NOTE — Progress Notes (Signed)
[name], [name] ([id number]) [id number] Page 1 of 2 Visit Report for 12/04/2021 HBO Details Patient Name: Date of Service: [name], [name] 12/04/2021 8:00 A M Medical Record Number: [id number] Patient Account Number: [id number] Date of Birth/Sex: Treating RN: 1970/05/18 [name] Primary Care [name]: [name] Other Clinician: Valeria [name] Referring [name]: Treating [name]/Extender: [name] in Treatment: 21 HBO Treatment Course Details Treatment Course Number: 1 Ordering [name]: [name] T Treatments Ordered: otal 80 HBO Treatment Start Date: 08/14/2021 HBO Indication: Diabetic Ulcer(s) of the Lower Extremity Standard/Conservative Wound Care tried and failed greater than or equal to 30 days HBO Treatment Details Treatment Number: 78 Patient Type: Outpatient Chamber Type: Monoplace Chamber Serial #: [id number] Treatment Protocol: 2.0 ATA with 90 minutes oxygen, and no air breaks Treatment Details Compression Rate Down: 2.0 psi / minute De-Compression Rate Up: 2.0 psi / minute Air breaks and breathing Decompress Decompress Compress Tx Pressure Begins Reached periods Begins Ends (leave unused spaces blank) Chamber Pressure (ATA 1 2 ------2 1 ) Clock Time (24 hr) 08:11 08:19 - - - - - - 09:49 09:58 Treatment Length: 107 (minutes) Treatment Segments: 4 Vital Signs Capillary Blood Glucose Reference Range: 80 - 120 mg / dl HBO Diabetic Blood Glucose Intervention Range: <131 mg/dl or >249 mg/dl Time Vitals Blood Respiratory Capillary Blood Glucose Pulse Action Type: Pulse: Temperature: Taken: Pressure: Rate: Glucose (mg/dl): Meter #: Oximetry (%) Taken: Pre 08:06 122/75 84 16 97.3 167 Post 10:03 154/77 74 16 97.9 204 Treatment Response Treatment Toleration: Well Treatment Completion Status: Treatment Completed without Adverse Event Additional Procedure  Documentation Tissue Sevierity: Necrosis of bone Physician HBO Attestation: I certify that I supervised this HBO treatment in accordance with Medicare guidelines. A trained emergency response team is readily available per Yes hospital policies and procedures. Continue HBOT as ordered. Yes Electronic Signature(s) Signed: 12/04/2021 5:14:21 PM By: [name] Maudlin MD FACS Previous Signature: 12/04/2021 3:16:37 PM Version By: [name] EMT Entered By: [name] on 12/04/2021 17:14:20 [name], [name] ([id number]) [id number] Page 2 of 2 -------------------------------------------------------------------------------- HBO Safety Checklist Details Patient Name: Date of Service: [name], [name] 12/04/2021 8:00 A M Medical Record Number: [id number] Patient Account Number: [id number] Date of Birth/Sex: Treating RN: [birth year] [name] Primary Care [name]: [name] Other Clinician: Valeria [name] Referring [name]: Treating [name]/Extender: [name] in Treatment: 21 HBO Safety Checklist Items Safety Checklist Consent Form Signed Patient voided / foley secured and emptied When did you last eato 0655 Last dose of injectable or oral agent 0645 Ostomy pouch emptied and vented if applicable NA All implantable devices assessed, documented and approved NA Intravenous access site secured and place NA Valuables secured Linens and cotton and cotton/polyester blend (less than 51% polyester) Personal oil-based products / skin lotions / body lotions removed Wigs or hairpieces removed NA Smoking or tobacco materials removed Books / newspapers / magazines / loose paper removed Cologne, aftershave, perfume and deodorant removed Jewelry removed (may wrap wedding band) Make-up removed NA Hair care products removed Battery operated devices (external) removed Heating patches and chemical warmers  removed Titanium eyewear removed NA Nail polish cured greater than 10 hours NA Casting material cured greater than 10 hours NA Hearing aids removed NA Loose dentures or partials removed NA Prosthetics have been removed NA Patient demonstrates correct use of air break device (if applicable) Patient concerns have been addressed Patient grounding bracelet on and cord attached to chamber  Specifics for Inpatients (complete in addition to above) Medication sheet sent with patient NA Intravenous medications needed or due during therapy sent with patient NA Drainage tubes (e.g. nasogastric tube or chest tube secured and vented) NA Endotracheal or Tracheotomy tube secured NA Cuff deflated of air and inflated with saline NA Airway suctioned NA Notes The safety check was done before the treatment was started. Electronic Signature(s) Signed: 12/04/2021 3:13:03 PM By: [name] EMT Entered By: [name] on 12/04/2021 15:13:03

## 2021-12-05 NOTE — Progress Notes (Signed)
[name], Darivs T ([id number]) [id number] Page 1 of 2 Visit Report for 12/05/2021 Problem List Details Patient Name: Date of Service: [name], [name] 12/05/2021 8:00 A M Medical Record Number: [id number] Patient Account Number: [id number] Date of Birth/Sex: Treating RN: 1970/04/10 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 12/05/2021 Medical Record Number: [id number] Patient Account Number: [id number] Date of Birth/Sex: Treating RN: 1970-12-13 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 X44.818 Other chronic osteomyelitis, right ankle and foot L97.514 Non-pressure chronic ulcer of other part of right foot with necrosis of bone [name], Khizar M ([id number]) [id number] Page 2 of  2 Facility Procedures : CPT4 Code Description: [id number] G0277-(Facility Use Only) HBOT full body chamber, 68mn , ICD-10 Diagnosis Description 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 L97.514 Non-pressure chronic ulcer of other part of right foo M86.671  Other chronic osteomyelitis, right ankle and foot Modifier: ical complication t with necrosis o Quantity: 4 f bone Physician Procedures : CPT4 Code Description Modifier [id number] 87867- WC PHYS HYPERBARIC OXYGEN THERAPY ICD-10 Diagnosis Description 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

## 2021-12-05 NOTE — Progress Notes (Signed)
[name], [name] ([id number]) [id number] Page 1 of 2 Visit Report for 12/05/2021 HBO Details Patient Name: Date of Service: [name], [name] 12/05/2021 8:00 A M Medical Record Number: [id number] Patient Account Number: [id number] Date of Birth/Sex: Treating RN: 02/08/1970 (51 y.o. [name], [name] Primary Care Reyaansh Merlo: [name] Other Clinician: [name] Referring Thailan Sava: Treating Olympia Adelsberger/Extender: [name] in Treatment: 75 HBO Treatment Course Details Treatment Course Number: 1 Ordering Makenah Karas: [name] T Treatments Ordered: otal 80 HBO Treatment Start Date: 08/14/2021 HBO Indication: Diabetic Ulcer(s) of the Lower Extremity Standard/Conservative Wound Care tried and failed greater than or equal to 30 days HBO Treatment Details Treatment Number: 79 Patient Type: Outpatient Chamber Type: Monoplace Chamber Serial #: [id number] Treatment Protocol: 2.0 ATA with 90 minutes oxygen, and no air breaks Treatment Details Compression Rate Down: 2.0 psi / minute De-Compression Rate Up: 2.0 psi / minute Air breaks and breathing Decompress Decompress Compress Tx Pressure Begins Reached periods Begins Ends (leave unused spaces blank) Chamber Pressure (ATA 1 2 ------2 1 ) Clock Time (24 hr) 07:58 08:07 - - - - - - 09:37 09:45 Treatment Length: 107 (minutes) Treatment Segments: 4 Vital Signs Capillary Blood Glucose Reference Range: 80 - 120 mg / dl HBO Diabetic Blood Glucose Intervention Range: <131 mg/dl or >249 mg/dl Time Vitals Blood Respiratory Capillary Blood Glucose Pulse Action Type: Pulse: Temperature: Taken: Pressure: Rate: Glucose (mg/dl): Meter #: Oximetry (%) Taken: Pre 07:56 132/84 80 16 97.9 146 Post 09:51 149/83 64 18 97.4 146 Treatment Response Treatment Toleration: Well Treatment Completion Status: Treatment Completed without Adverse Event Additional Procedure Documentation Tissue  Sevierity: Necrosis of bone Physician HBO Attestation: I certify that I supervised this HBO treatment in accordance with Medicare guidelines. A trained emergency response team is readily available per Yes hospital policies and procedures. Continue HBOT as ordered. Yes Electronic Signature(s) Signed: 12/05/2021 12:38:50 PM By: [name] MD FACS Previous Signature: 12/05/2021 11:32:49 AM Version By: [name] EMT Entered By: [name] on 12/05/2021 12:38:50 [name], [name] ([id number]) [id number] Page 2 of 2 -------------------------------------------------------------------------------- HBO Safety Checklist Details Patient Name: Date of Service: [name], [name] 12/05/2021 8:00 A M Medical Record Number: [id number] Patient Account Number: [id number] Date of Birth/Sex: Treating RN: 22-Apr-1970 (51 y.o. [name], [name] Primary Care Kalisa Girtman: [name] Other Clinician: [name] Referring Aditri Louischarles: Treating Stacye Noori/Extender: [name] in Treatment: 22 HBO Safety Checklist Items Safety Checklist Consent Form Signed Patient voided / foley secured and emptied When did you last eato 0645 Last dose of injectable or oral agent 0635 Ostomy pouch emptied and vented if applicable NA All implantable devices assessed, documented and approved NA Intravenous access site secured and place NA Valuables secured Linens and cotton and cotton/polyester blend (less than 51% polyester) Personal oil-based products / skin lotions / body lotions removed Wigs or hairpieces removed NA Smoking or tobacco materials removed Books / newspapers / magazines / loose paper removed Cologne, aftershave, perfume and deodorant removed Jewelry removed (may wrap wedding band) Make-up removed NA Hair care products removed Battery operated devices (external) removed Heating patches and chemical warmers removed Titanium eyewear  removed NA Nail polish cured greater than 10 hours NA Casting material cured greater than 10 hours NA Hearing aids removed NA Loose dentures or partials removed NA Prosthetics have been removed NA Patient demonstrates correct use of air break device (if applicable) Patient concerns have been addressed Patient grounding bracelet on and cord attached to chamber  Specifics for Inpatients (complete in addition to above) Medication sheet sent with patient NA Intravenous medications needed or due during therapy sent with patient NA Drainage tubes (e.g. nasogastric tube or chest tube secured and vented) NA Endotracheal or Tracheotomy tube secured NA Cuff deflated of air and inflated with saline NA Airway suctioned NA Notes The safety check was done before the treatment was started. Electronic Signature(s) Signed: 12/05/2021 11:31:38 AM By: [name] EMT Entered By: [name] on 12/05/2021 11:31:38

## 2021-12-05 NOTE — Progress Notes (Signed)
[name], [name] ([id number]) [id number] Page 1 of 2 Visit Report for 12/04/2021 Problem List Details Patient Name: Date of Service: [name], [name] 12/04/2021 8:00 [name] Medical Record Number: [id number] Patient Account Number: [id number] Date of Birth/Sex: Treating RN: November 11, 1970 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 12/04/2021 Medical Record Number: [id number] Patient Account Number: [id number] Date of Birth/Sex: Treating RN: [birth year] 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 J82.505 Other chronic osteomyelitis, right ankle and foot L97.514 Non-pressure chronic ulcer of other part of right foot with necrosis of bone [name], Shashwat [name] ([id number]) [id number] Page 2 of  2 Facility Procedures : CPT4 Code Description: 37902409 G0277-(Facility Use Only) HBOT full body chamber, 36mn , ICD-10 Diagnosis Description 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 L97.514 Non-pressure chronic ulcer of other part of right foo M86.671  Other chronic osteomyelitis, right ankle and foot Modifier: ical complication t with necrosis o Quantity: 4 f bone Physician Procedures : CPT4 Code Description Modifier [id number] 24268- WC PHYS HYPERBARIC OXYGEN THERAPY ICD-10 Diagnosis Description 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

## 2021-12-06 ENCOUNTER — Encounter (HOSPITAL_BASED_OUTPATIENT_CLINIC_OR_DEPARTMENT_OTHER): Payer: BLUE CROSS/BLUE SHIELD | Admitting: General Surgery

## 2021-12-06 DIAGNOSIS — L97514 Non-pressure chronic ulcer of other part of right foot with necrosis of bone: Secondary | ICD-10-CM

## 2021-12-06 DIAGNOSIS — M86671 Other chronic osteomyelitis, right ankle and foot: Secondary | ICD-10-CM

## 2021-12-06 DIAGNOSIS — E1149 Type 2 diabetes mellitus with other diabetic neurological complication: Secondary | ICD-10-CM | POA: Diagnosis not present

## 2021-12-06 LAB — GLUCOSE, CAPILLARY
Glucose-Capillary: 201 mg/dL — ABNORMAL HIGH (ref 70–99)
Glucose-Capillary: 179 mg/dL — ABNORMAL HIGH (ref 70–99)

## 2021-12-06 NOTE — Progress Notes (Signed)
[name], [name] ([id number]) [id number] Page 1 of 2 Visit Report for 12/06/2021 HBO Details Patient Name: Date of Service: [name], [name] 12/06/2021 8:00 [name] Medical Record Number: [id number] Patient Account Number: [id number] Date of Birth/Sex: Treating RN: 05/19/1970 (51 y.o. [name] Primary Care Elnita Surprenant: [name] Other Clinician: [name] Referring Synia Douglass: Treating Narelle Schoening/Extender: [name] in Treatment: 22 HBO Treatment Course Details Treatment Course Number: 1 Ordering Duel Conrad: [name] T Treatments Ordered: otal 80 HBO Treatment 08/14/2021 Start Date: HBO Indication: Diabetic Ulcer(s) of the Lower Extremity HBO Treatment 12/06/2021 End Date: Standard/Conservative Wound Care tried and failed greater than or equal to 30 days HBO Discharge Treatment Series Complete; Wound Progress Plateaued / Outcome: Significant Improvement HBO Treatment Details Treatment Number: 80 Patient Type: Outpatient Chamber Type: Monoplace Chamber Serial #: [id number] Treatment Protocol: 2.0 ATA with 90 minutes oxygen, and no air breaks Treatment Details Compression Rate Down: 2.0 psi / minute De-Compression Rate Up: 2.0 psi / minute Air breaks and breathing Decompress Decompress Compress Tx Pressure Begins Reached periods Begins Ends (leave unused spaces blank) Chamber Pressure (ATA 1 2 ------2 1 ) Clock Time (24 hr) 08:12 08:20 - - - - - - 09:51 09:58 Treatment Length: 106 (minutes) Treatment Segments: 4 Vital Signs Capillary Blood Glucose Reference Range: 80 - 120 mg / dl HBO Diabetic Blood Glucose Intervention Range: <131 mg/dl or >249 mg/dl Time Vitals Blood Respiratory Capillary Blood Glucose Pulse Action Type: Pulse: Temperature: Taken: Pressure: Rate: Glucose (mg/dl): Meter #: Oximetry (%) Taken: Pre 08:06 129/82 79 18 98.8 201 Post 10:04 151/92 69 18 97.7 179 Treatment  Response Treatment Toleration: Well Treatment Completion Status: Treatment Completed without Adverse Event Additional Procedure Documentation Tissue Sevierity: Necrosis of bone Electronic Signature(s) Signed: 12/06/2021 2:04:28 PM By: [name] EMT Signed: 12/09/2021 11:34:26 AM By: [name] DO Entered By: [name] on 12/06/2021 14:04:27 [name], [name] ([id number]) [id number] Page 2 of 2 -------------------------------------------------------------------------------- HBO Safety Checklist Details Patient Name: Date of Service: [name], [name] 12/06/2021 8:00 [name] Medical Record Number: [id number] Patient Account Number: [id number] Date of Birth/Sex: Treating RN: Mar 26, 1970 (51 y.o. [name] Primary Care Jerrit Horen: [name] Other Clinician: [name] Referring Kailand Seda: Treating Keiko Myricks/Extender: [name] in Treatment: 22 HBO Safety Checklist Items Safety Checklist Consent Form Signed Patient voided / foley secured and emptied When did you last eato 0650 Last dose of injectable or oral agent 0700 Ostomy pouch emptied and vented if applicable NA All implantable devices assessed, documented and approved NA Intravenous access site secured and place NA Valuables secured Linens and cotton and cotton/polyester blend (less than 51% polyester) Personal oil-based products / skin lotions / body lotions removed Wigs or hairpieces removed NA Smoking or tobacco materials removed Books / newspapers / magazines / loose paper removed Cologne, aftershave, perfume and deodorant removed Jewelry removed (may wrap wedding band) Make-up removed NA Hair care products removed Battery operated devices (external) removed Heating patches and chemical warmers removed Titanium eyewear removed NA Nail polish cured greater than 10 hours NA Casting material cured greater than 10 hours NA Hearing aids  removed NA Loose dentures or partials removed NA Prosthetics have been removed NA Patient demonstrates correct use of air break device (if applicable) Patient concerns have been addressed Patient grounding bracelet on and cord attached to chamber Specifics for Inpatients (complete in addition to above) Medication sheet sent with patient NA Intravenous medications needed or due during therapy  sent with patient NA Drainage tubes (e.g. nasogastric tube or chest tube secured and vented) NA Endotracheal or Tracheotomy tube secured NA Cuff deflated of air and inflated with saline NA Airway suctioned NA Notes The safety check was done before the treatment was started. Electronic Signature(s) Signed: 12/06/2021 2:02:18 PM By: [name] EMT Entered By: [name] on 12/06/2021 14:02:18

## 2021-12-06 NOTE — Progress Notes (Signed)
[name], [name] ([id number]) [id number] Page 1 of 2 Visit Report for 12/06/2021 Arrival Information Details Patient Name: Date of Service: [name], [name] 12/06/2021 8:00 [name] Medical Record Number: [id number] Patient Account Number: [id number] Date of Birth/Sex: Treating RN: [birth year] [name] Primary Care Michaelangelo Mittelman: [name] Other Clinician: [name] Referring [name]: Treating Tanis Burnley/Extender: [name]: 21 Visit Information History Since Last Visit All ordered tests and consults were completed: Yes Patient Arrived: Ambulatory Added or deleted any medications: No Arrival Time: 07:40 Any new allergies or adverse reactions: No Accompanied By: None Had a fall or experienced change [name] No Transfer Assistance: None activities of daily living that may affect Patient Identification Verified: Yes risk of falls: Secondary Verification Process Completed: Yes Signs or symptoms of abuse/neglect since last visito No Patient Requires Transmission-Based Precautions: No Hospitalized since last visit: No Patient Has Alerts: Yes Implantable device outside of the clinic excluding No cellular tissue based products placed [name] the center since last visit: Pain Present Now: No Electronic Signature(s) Signed: 12/06/2021 2:00:35 PM By: [name] EMT Entered By: [name] on 12/06/2021 14:00:35 -------------------------------------------------------------------------------- Encounter Discharge Information Details Patient Name: Date of Service: [name], [name] 12/06/2021 8:00 [name] Medical Record Number: [id number] Patient Account Number: [id number] Date of Birth/Sex: Treating RN: 12/27/1970 [name] Primary Care Quante Pettry: [name] Other Clinician: [name] Referring Niamh Rada: Treating Seerat Peaden/Extender: [name]  Treatment: 22 Encounter Discharge Information Items Discharge Condition: Stable Ambulatory Status: Ambulatory Discharge Destination: Home Transportation: Private Auto Accompanied By: None Schedule Follow-up Appointment: Yes Clinical Summary of Care: Electronic Signature(s) Signed: 12/06/2021 2:05:28 PM By: [name] EMT Entered By: [name] on 12/06/2021 14:05:28 [name], [name] ([id number]) [id number] Page 2 of 2 -------------------------------------------------------------------------------- Vitals Details Patient Name: Date of Service: [name], [name] 12/06/2021 8:00 [name] Medical Record Number: [id number] Patient Account Number: [id number] Date of Birth/Sex: Treating RN: [birth year] [name] Primary Care Keyonta Madrid: [name] Other Clinician: [name] Referring Taren Dymek: Treating Larisa Lanius/Extender: [name]: 22 Vital Signs Time Taken: 08:06 Temperature (F): 98.8 Height ([name]: 72 Pulse (bpm): 79 Weight (lbs): 312 Respiratory Rate (breaths/min): 18 Body Mass Index (BMI): 42.3 Blood Pressure (mmHg): 129/82 Capillary Blood Glucose (mg/dl): 201 Reference Range: 80 - 120 mg / dl Electronic Signature(s) Signed: 12/06/2021 2:01:09 PM By: [name] EMT Entered By: [name] on 12/06/2021 14:01:09

## 2021-12-09 NOTE — Progress Notes (Signed)
[name], [name] ([id number]) [id number] Page 1 of 2 Visit Report for 12/06/2021 Problem List Details Patient Name: Date of Service: [name], [name] 12/06/2021 8:00 [name] Medical Record Number: [id number] Patient Account Number: [id number] Date of Birth/Sex: Treating RN: 07-05-1970 (51 y.o. 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 12/06/2021 Medical Record Number: [id number] Patient Account Number: [id number] Date of Birth/Sex: Treating RN: [birth year] (51 y.o. 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 M84.132 Other chronic osteomyelitis, right ankle and foot L97.514 Non-pressure chronic ulcer of other part of right foot with necrosis of bone [name], [name] ([id number]) [id number] Page 2  of 2 Facility Procedures : CPT4 Code Description: 36644034 G0277-(Facility Use Only) HBOT full body chamber, 41mn , ICD-10 Diagnosis Description 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 L97.514 Non-pressure chronic ulcer of other part of right foo M86.671  Other chronic osteomyelitis, right ankle and foot Modifier: ical complication t with necrosis o Quantity: 4 f bone Physician Procedures : CPT4 Code Description Modifier [id number] 38756- WC PHYS HYPERBARIC OXYGEN THERAPY ICD-10 Diagnosis Description 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

## 2021-12-16 ENCOUNTER — Encounter: Payer: Self-pay | Admitting: Internal Medicine

## 2021-12-16 ENCOUNTER — Other Ambulatory Visit: Payer: Self-pay

## 2021-12-16 ENCOUNTER — Ambulatory Visit (INDEPENDENT_AMBULATORY_CARE_PROVIDER_SITE_OTHER): Payer: BLUE CROSS/BLUE SHIELD | Admitting: Internal Medicine

## 2021-12-16 VITALS — BP 130/82 | HR 81 | Temp 98.8°F | Ht 72.0 in | Wt 312.0 lb

## 2021-12-16 DIAGNOSIS — L089 Local infection of the skin and subcutaneous tissue, unspecified: Secondary | ICD-10-CM | POA: Diagnosis not present

## 2021-12-16 DIAGNOSIS — M86171 Other acute osteomyelitis, right ankle and foot: Secondary | ICD-10-CM

## 2021-12-16 DIAGNOSIS — D649 Anemia, unspecified: Secondary | ICD-10-CM

## 2021-12-16 DIAGNOSIS — E11628 Type 2 diabetes mellitus with other skin complications: Secondary | ICD-10-CM | POA: Diagnosis not present

## 2021-12-16 NOTE — Assessment & Plan Note (Signed)
Patient will remain on Augmentin 864m BID while ulcer continues to heal.  He reports having refills available at pharmacy and instructed to let [place] if any [place] are needed so we can send in as needed.  He will continue with weekly wound care and I'll see him back in about 6 weeks.  Check ESR, CRP today.

## 2021-12-16 NOTE — Assessment & Plan Note (Signed)
This has improved steadily since stopping linezolid and was up to 11 on 12/03/21.

## 2021-12-16 NOTE — Progress Notes (Signed)
[facility]  CHIEF COMPLAINT:    Follow up for osteomyelitis  SUBJECTIVE:    [name] is a 51 y.o. male with PMHx as below who presents to the clinic for osteomyelitis.   Here today for planned follow up.  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  Discussed limb salvage vs amputation and patient wanted to be as aggressive as possible to salvage limb.  He has completed his HBO therapy and has now just been doing weekly wound care.  He has completed an aggressive antibiotic course consisting of: -- Cefepime and Daptomycin via PICC line 08/09/21 through 09/20/21 (6 weeks -- Linezolid and Levaquin by mouth 09/21/21 - 11/03/21 (6 weeks) -- Augmentin "suppression" starting 11/05/21 through present  Of note, patient had to be admitted last month after monitoring labs showed significant drop in hemoglobin.  His numbers improved and he had GI work up that was negative for bleed.  It is suspected that he had linezolid induced anemia.  His hemoglobin has improved and most recently was 11.0 on 12/03/21.  He reports continued improvement in regards to his foot wound. He has no issues tolerating Augmentin.  Please see A&P for the details of today's visit and status of the patient's medical problems.   Patient's Medications  New Prescriptions   No medications on file  Previous Medications   AMOXICILLIN-CLAVULANATE (AUGMENTIN) 875-125 MG TABLET    Take 1 tablet by mouth 2 (two) times daily.   ASCORBIC ACID (VITAMIN C) 500 MG TABLET    Take 500 mg by mouth daily.   ASPIRIN EC 81 MG TABLET    Take 81 mg by mouth daily.   ATORVASTATIN (LIPITOR) 80 MG TABLET    Take 1 tablet (80 mg total) by mouth daily.   CYANOCOBALAMIN (VITAMIN B-12) 5000 MCG SUBL    Place 5,000 mcg under the tongue daily.   FENOFIBRATE (TRICOR) 48 MG TABLET    TAKE 1 TABLET BY MOUTH EVERY DAY   GLUCOSE BLOOD  (ACCU-CHEK GUIDE) TEST STRIP    Dx E11.49 - Use to check blood glucose three times daily or as directed.   HYDROCHLOROTHIAZIDE (MICROZIDE) 12.5 MG CAPSULE    TAKE 1 CAPSULE BY MOUTH EVERY DAY IN THE MORNING   INSULIN ASPART (NOVOLOG) 100 UNIT/ML FLEXPEN    Inject 15-25 Units into the skin 3 (three) times daily with meals. Please provide pen needles if possible   INSULIN DEGLUDEC (TRESIBA FLEXTOUCH) 200 UNIT/ML FLEXTOUCH PEN    Inject 80-100 Units into the skin daily.   INSULIN SYRINGE-NEEDLE U-100 (BD INSULIN SYRINGE U/F) 31G X 5/16" 0.5 ML MISC    Use to inject insulin 4 times daily. DX:E11.9   INSULIN SYRINGE-NEEDLE U-100 (BD INSULIN SYRINGE U/F) 31G X 5/16" 1 ML MISC    Use to inject tresiba/long acting insulin once daily   INSULIN SYRINGE-NEEDLE U-100 (BD VEO INSULIN SYR U/F 1/2UNIT) 31G X 15/64" 0.3 ML MISC    USE TO INJECT INSULIN FOUR TIMES DAILY AS DIRECTED   JANUVIA 50 MG TABLET    TAKE 1 TABLET BY MOUTH EVERY DAY IN THE MORNING   LANCETS (ONETOUCH ULTRASOFT) LANCETS    Use to test blood sugars daily. Dx: E11.9   LANCETS MISC    USED TO CHECK BLOOD GLUCOSE 3-4 TIMES DAILY   MULTIPLE VITAMIN (MULTIVITAMIN WITH MINERALS) TABS TABLET    Take 1 tablet by  mouth 2 (two) times daily.   OMEGA-3 FATTY ACIDS (FISH OIL) 1360 MG CAPS    Take 1,360 mg by mouth daily.    PYRIDOXINE (B-6) 100 MG TABLET    Take 100 mg by mouth 2 (two) times daily.   RAMIPRIL (ALTACE) 5 MG CAPSULE    TAKE 1 CAPSULE BY MOUTH EVERY DAY IN THE MORNING  Modified Medications   No medications on file  Discontinued Medications   No medications on file      Past Medical History:  Diagnosis Date   Cellulitis and abscess of foot    05/ 2021 left   CKD (chronic kidney disease), stage III ([place]) 06-17-2019 per pt last visit 05-29-2016 (note in care everywhere)  currently followed by pcp   nephrologist-  dr [name] ([facility] in [place])     Diabetic peripheral neuropathy ([place])    Elevated LFTs    followed  by pcp   Hypertension    followed by pcp---- (06-17-2019 pt stated never had a stress test)   Iron deficiency anemia    Mixed hyperlipidemia    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   Prolactinoma, 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   (06-17-2019 pt stated has been stable with no changes and followed by pcp)   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[place])    11-06-2016  lower extremity angiography (dr [name])  mil tibial disease w/ occlusion of the distal peroneal artery and dorsalis pedis artery but has widely patent posterior tibial and anterior tibial arterties   Type 2 diabetes mellitus treated with insulin ([place])    followed by pcp   (06-17-2019  pt stated checks blood sugar daily in am,  fasting sugar-- average 71)   Vitamin B 12 deficiency     Social History   Tobacco Use   Smoking status: Never   Smokeless tobacco: Never  Vaping Use   Vaping Use: Never used  Substance Use Topics   Alcohol use: No   Drug use: Never    Family History  Problem Relation Age of Onset   Sarcoidosis Mother    Stroke Father        early 54s. former smoker   Diabetes Father    Hypertension Father    Hypertension Sister    Diabetes Paternal Grandmother    Congenital heart disease Paternal Grandmother        died of complications   Healthy Daughter    Colon cancer Neg Hx    Colon polyps Neg Hx    Esophageal cancer Neg Hx    Rectal cancer Neg Hx    Stomach cancer Neg Hx     No Known Allergies  Review of Systems  All other systems reviewed and are negative.    OBJECTIVE:    Vitals:   12/16/21 1039  BP: (!) 157/83  Pulse: 81  Temp: 98.8 F (37.1 C)  TempSrc: Oral  SpO2: 98%  Weight: (!) 312 lb (141.5 kg)  Height: 6' (1.829 m)   Body mass index is 42.31 kg/m.  Physical Exam Constitutional:      Appearance: Normal appearance.  HENT:     Head: Normocephalic and atraumatic.  Pulmonary:     Effort: Pulmonary effort is normal. No respiratory distress.  Musculoskeletal:        General: Normal range  of motion.     Cervical back: Normal range of motion and neck supple.  Skin:    General: Skin is warm and dry.  Neurological:  General: No focal deficit present.     Mental Status: He is alert and oriented to person, place, and time.      Labs and Microbiology:    Latest Ref Rng & Units 12/03/2021    1:57 PM 11/11/2021    1:39 PM 11/08/2021    5:45 AM  CBC  WBC 4.0 - 10.5 K/uL 10.1  6.4  8.1   Hemoglobin 13.0 - 17.0 g/dL 11.0  9.2  8.8   Hematocrit 39.0 - 52.0 % 33.4  28.2  26.3   Platelets 150.0 - 400.0 K/uL 298.0  271.0  224       Latest Ref Rng & Units 11/11/2021    1:39 PM 11/07/2021    4:34 PM 11/07/2021    5:44 AM  CMP  Glucose 70 - 99 mg/dL 151   112   BUN 6 - 23 mg/dL 16   17   Creatinine 0.40 - 1.50 mg/dL 1.18   1.22   Sodium 135 - 145 mEq/L 138   138   Potassium 3.5 - 5.1 mEq/L 4.1   3.7   Chloride 96 - 112 mEq/L 104   109   CO2 19 - 32 mEq/L 26   24   Calcium 8.4 - 10.5 mg/dL 9.5   8.9   Total Protein 6.0 - 8.3 g/dL 7.4   6.4   Total Bilirubin 0.2 - 1.2 mg/dL 0.7  1.6  1.9   Alkaline Phos 39 - 117 U/L 67   43   AST 0 - 37 U/L 25   15   ALT 0 - 53 U/L 23   20      No results found for this or any previous visit (from the past 240 hour(s)).    ASSESSMENT & PLAN:    Symptomatic anemia This has improved steadily since stopping linezolid and was up to 11 on 12/03/21.  Osteomyelitis of ankle or foot, acute, right ([place]) Patient will remain on Augmentin 832m BID while ulcer continues to heal.  He reports having refills available at pharmacy and instructed to let [place] if any fills are needed so we can send in as needed.  He will continue with weekly wound care and I'll see him back in about 6 weeks.  Check ESR, CRP today.    Orders Placed This Encounter  Procedures   C-reactive protein   Sedimentation rate       [facility]for [facility]ious [facility] 12/16/2021, 11:06 AM

## 2021-12-17 ENCOUNTER — Encounter (HOSPITAL_BASED_OUTPATIENT_CLINIC_OR_DEPARTMENT_OTHER): Payer: BLUE CROSS/BLUE SHIELD | Admitting: General Surgery

## 2021-12-17 DIAGNOSIS — E1149 Type 2 diabetes mellitus with other diabetic neurological complication: Secondary | ICD-10-CM | POA: Diagnosis not present

## 2021-12-17 LAB — C-REACTIVE PROTEIN: CRP: 13.6 mg/L — ABNORMAL HIGH (ref <8.0)

## 2021-12-17 LAB — SEDIMENTATION RATE: Sed Rate: 25 mm/h — ABNORMAL HIGH (ref 0–20)

## 2021-12-17 NOTE — Progress Notes (Signed)
[name], Takeru Z ([id number]) [id number] Page 1 of 7 Visit Report for 12/17/2021 Arrival Information Details Patient Name: Date of Service: Ackerman, Fountain [name] 12/17/2021 7:45 A M Medical Record Number: [id number] Patient Account Number: [id number] Date of Birth/Sex: Treating RN: 1970-05-09 (51 y.o. [name] Primary Care Cleota Pellerito: [name] Other Clinician: Referring Xandria Gallaga: Treating Laconya Clere/Extender: [name] in Treatment: 23 Visit Information History Since Last Visit Added or deleted any medications: No Patient Arrived: Ambulatory Any new allergies or adverse reactions: No Arrival Time: 07:49 Had a fall or experienced change in No Accompanied By: self activities of daily living that may affect Transfer Assistance: None risk of falls: Patient Identification Verified: Yes Signs or symptoms of abuse/neglect since last visito No Secondary Verification Process Completed: Yes Hospitalized since last visit: No Patient Requires Transmission-Based Precautions: No Implantable device outside of the clinic excluding No Patient Has Alerts: Yes cellular tissue based products placed in the center since last visit: Has Dressing in Place as Prescribed: Yes Pain Present Now: No Electronic Signature(s) Signed: 12/17/2021 5:04:15 PM By: Sharyn Creamer RN, BSN Entered By: Sharyn Creamer on 12/17/2021 07:49:34 -------------------------------------------------------------------------------- Encounter Discharge Information Details Patient Name: Date of Service: [name], [name] 12/17/2021 7:45 A M Medical Record Number: [id number] Patient Account Number: [id number] Date of Birth/Sex: Treating RN: [birth year] (51 y.o. [name] Primary Care Wendell Fiebig: [name] Other Clinician: Referring Audi Wettstein: Treating Aldene Hendon/Extender: [name] in Treatment: 23 Encounter Discharge Information  Items Post Procedure Vitals Discharge Condition: Stable Temperature (F): 98.3 Ambulatory Status: Ambulatory Pulse (bpm): 80 Discharge Destination: Home Respiratory Rate (breaths/min): 18 Transportation: Private Auto Blood Pressure (mmHg): 142/78 Accompanied By: self Schedule Follow-up Appointment: Yes Clinical Summary of Care: Patient Declined Electronic Signature(s) Signed: 12/17/2021 5:04:15 PM By: Sharyn Creamer RN, BSN Entered By: Sharyn Creamer on 12/17/2021 08:16:17 [name], Samuell M ([id number]) [id number] Page 2 of 7 -------------------------------------------------------------------------------- Lower Extremity Assessment Details Patient Name: Date of Service: [name], [name] 12/17/2021 7:45 A M Medical Record Number: [id number] Patient Account Number: [id number] Date of Birth/Sex: Treating RN: 25-Apr-1970 (51 y.o. [name] Primary Care Keishia Ground: [name] Other Clinician: Referring Tishawna Larouche: Treating Carolena Fairbank/Extender: [name] in Treatment: 23 Edema Assessment Assessed: [name]: No] [name]: No] Edema: [Left: N] [Right: o] Calf Left: Right: Point of Measurement: From Medial Instep 39 cm Ankle Left: Right: Point of Measurement: From Medial Instep 23 cm Vascular Assessment Pulses: Dorsalis Pedis Palpable: [Right:Yes] Electronic Signature(s) Signed: 12/17/2021 5:04:15 PM By: Sharyn Creamer RN, BSN Entered By: Sharyn Creamer on 12/17/2021 07:53:17 -------------------------------------------------------------------------------- Multi Wound Chart Details Patient Name: Date of Service: Horald Chestnut, [name] 12/17/2021 7:45 A M Medical Record Number: [id number] Patient Account Number: [id number] Date of Birth/Sex: Treating RN: 19-Jul-1970 (51 y.o. M) Primary Care Anasha Perfecto: [name] Other Clinician: Referring Dayne Dekay: Treating Grigor Lipschutz/Extender: [name] in  Treatment: 23 Vital Signs Height(in): 72 Capillary Blood Glucose(mg/dl): 189 Weight(lbs): 312 Pulse(bpm): 58 Body Mass Index(BMI): 42.3 Blood Pressure(mmHg): 142/78 Temperature(F): 98.3 Respiratory Rate(breaths/min): 18 [2:Photos:] [N/A:N/A] Right, Lateral, Plantar Foot N/A N/A Wound Location: Gradually Appeared N/A N/A Wounding Event: Diabetic Wound/Ulcer of the Lower N/A N/A Primary Etiology: Extremity Type II Diabetes N/A N/A Comorbid History: 12/19/2016 N/A N/A Date Acquired: 43 N/A N/A Weeks of Treatment: Open N/A N/A Wound Status: No N/A N/A Wound Recurrence: 0.5x1.7x1 N/A N/A Measurements L x W x D (cm) 0.668 N/A N/A A (cm) : rea 0.668 N/A N/A Volume (cm) :  81.50% N/A N/A % Reduction in A rea: 96.30% N/A N/A % Reduction in Volume: Grade 3 N/A N/A Classification: Medium N/A N/A Exudate A mount: Serosanguineous N/A N/A Exudate Type: red, brown N/A N/A Exudate Color: Thickened N/A N/A Wound Margin: Large (67-100%) N/A N/A Granulation A mount: Red N/A N/A Granulation Quality: Small (1-33%) N/A N/A Necrotic A mount: Fat Layer (Subcutaneous Tissue): Yes N/A N/A Exposed Structures: Fascia: No Tendon: No Muscle: No Joint: No Bone: No Small (1-33%) N/A N/A Epithelialization: Debridement - Excisional N/A N/A Debridement: Pre-procedure Verification/Time Out 07:54 N/A N/A Taken: Lidocaine 5% topical ointment N/A N/A Pain Control: Callus, Subcutaneous, Slough N/A N/A Tissue Debrided: Skin/Subcutaneous Tissue N/A N/A Level: 0.85 N/A N/A Debridement A (sq cm): rea Curette N/A N/A Instrument: Minimum N/A N/A Bleeding: Pressure N/A N/A Hemostasis A chieved: 0 N/A N/A Procedural Pain: 0 N/A N/A Post Procedural Pain: Procedure was tolerated well N/A N/A Debridement Treatment Response: 0.5x1.7x1 N/A N/A Post Debridement Measurements L x W x D (cm) 0.668 N/A N/A Post Debridement Volume: (cm) Callus: Yes N/A N/A Periwound Skin  Texture: No Abnormalities Noted N/A N/A Periwound Skin Moisture: No Abnormalities Noted N/A N/A Periwound Skin Color: No Abnormality N/A N/A Temperature: Debridement N/A N/A Procedures Performed: Treatment Notes Electronic Signature(s) Signed: 12/17/2021 8:05:10 AM By: [name] MD FACS Entered By: [name] on 12/17/2021 08:05:09 -------------------------------------------------------------------------------- Multi-Disciplinary Care Plan Details Patient Name: Date of Service: [name], [name] 12/17/2021 7:45 A M Medical Record Number: [id number] Patient Account Number: [id number] Date of Birth/Sex: Treating RN: 1970/04/09 (51 y.o. [name] Primary Care Ezell Melikian: [name] Other Clinician: Referring Myasia Sinatra: Treating Kenan Moodie/Extender: [name] in Treatment: [place] reviewed with physician [address] [name], Ladavion Grawn ([id number]) [id number] Page 4 of 7 Nutrition Nursing Diagnoses: Impaired glucose control: actual or potential Potential for alteratiion in Nutrition/Potential for imbalanced nutrition Goals: Patient/caregiver will maintain therapeutic glucose control Date Initiated: 07/29/2021 Target Resolution Date: 12/20/2021 Goal Status: Active Interventions: Assess patient nutrition upon admission and as needed per policy Provide education on elevated blood sugars and impact on wound healing Treatment Activities: Patient referred to Primary Care Physician for further nutritional evaluation : 07/29/2021 Notes: Osteomyelitis Nursing Diagnoses: Infection: osteomyelitis Knowledge deficit related to disease process and management Goals: Patient/caregiver will verbalize understanding of disease process and disease management Date Initiated: 07/29/2021 Date Inactivated: 11/12/2021 Target Resolution Date: 11/19/2021 Goal Status: Met Patient's osteomyelitis will  resolve Date Initiated: 07/29/2021 Target Resolution Date: 12/20/2021 Goal Status: Active Interventions: Assess for signs and symptoms of osteomyelitis resolution every visit Provide education on osteomyelitis Treatment Activities: Consult for HBO : 07/29/2021 Notes: Wound/Skin Impairment Nursing Diagnoses: Impaired tissue integrity Goals: Patient/caregiver will verbalize understanding of skin care regimen Date Initiated: 07/04/2021 Target Resolution Date: 12/20/2021 Goal Status: Active Interventions: Assess ulceration(s) every visit Treatment Activities: Skin care regimen initiated : 07/04/2021 Notes: Electronic Signature(s) Signed: 12/17/2021 5:04:15 PM By: Sharyn Creamer RN, BSN Entered By: Sharyn Creamer on 12/17/2021 08:12:43 -------------------------------------------------------------------------------- Pain Assessment Details Patient Name: Date of Service: [name], [name] 12/17/2021 7:45 A M Medical Record Number: [id number] Patient Account Number: [id number] [name], Cruz M ([id number]) [id number] Page 5 of 7 Date of Birth/Sex: Treating RN: June 21, 1970 (51 y.o. [name] Primary Care Annalynne Ibanez: Other Clinician: [name] Referring Robbin Loughmiller: Treating Naylea Wigington/Extender: [name] in Treatment: 23 Active Problems Location of Pain Severity and Description of Pain Patient Has Paino No Site Locations Pain Management and Medication Current Pain Management: Electronic  Signature(s) Signed: 12/17/2021 5:04:15 PM By: Sharyn Creamer RN, BSN Entered By: Sharyn Creamer on 12/17/2021 07:49:51 -------------------------------------------------------------------------------- Patient/Caregiver Education Details Patient Name: Date of Service: Horald Chestnut, [name] 11/28/2023andnbsp7:45 A M Medical Record Number: [id number] Patient Account Number: [id number] Date of Birth/Gender: Treating RN: September 01, 1970 (51 y.o. [name] Primary Care Physician: [name] Other Clinician: Referring Physician: Treating Physician/Extender: [name] in Treatment: 23 Education Assessment Education Provided To: Patient Education Topics Provided Elevated Blood Sugar/ Impact on Healing: Methods: Explain/Verbal Responses: State content correctly Wound/Skin Impairment: Methods: Explain/Verbal Responses: State content correctly Electronic Signature(s) Signed: 12/17/2021 5:04:15 PM By: Sharyn Creamer RN, BSN Entered By: Sharyn Creamer on 12/17/2021 08:15:00 [name], Brek M ([id number]) [id number] Page 6 of 7 -------------------------------------------------------------------------------- Wound Assessment Details Patient Name: Date of Service: [name], [name] 12/17/2021 7:45 A M Medical Record Number: [id number] Patient Account Number: [id number] Date of Birth/Sex: Treating RN: 06/24/1970 (51 y.o. [name] Primary Care Chara Marquard: [name] Other Clinician: Referring Montrez Marietta: Treating Ephram Kornegay/Extender: [name] in Treatment: 23 Wound Status Wound Number: 2 Primary Etiology: Diabetic Wound/Ulcer of the Lower Extremity Wound Location: Right, Lateral, Plantar Foot Wound Status: Open Wounding Event: Gradually Appeared Comorbid History: Type II Diabetes Date Acquired: 12/19/2016 Weeks Of Treatment: 23 Clustered Wound: No Photos Wound Measurements Length: (cm) Width: (cm) Depth: (cm) Area: (cm) Volume: (cm) 0.5 % Reduction in Area: 81.5% 1.7 % Reduction in Volume: 96.3% 1 Epithelialization: Small (1-33%) 0.668 Tunneling: No 0.668 Undermining: No Wound Description Classification: Grade 3 Wound Margin: Thickened Exudate Amount: Medium Exudate Type: Serosanguineous Exudate Color: red, brown Foul Odor After Cleansing: No Slough/Fibrino Yes Wound Bed Granulation Amount: Large (67-100%)  Exposed Structure Granulation Quality: Red Fascia Exposed: No Necrotic Amount: Small (1-33%) Fat Layer (Subcutaneous Tissue) Exposed: Yes Necrotic Quality: Adherent Slough Tendon Exposed: No Muscle Exposed: No Joint Exposed: No Bone Exposed: No Periwound Skin Texture Texture Color No Abnormalities Noted: No No Abnormalities Noted: Yes Callus: Yes Temperature / Pain Temperature: No Abnormality Moisture No Abnormalities Noted: Yes Treatment Notes Wound #2 (Foot) Wound Laterality: Plantar, Right, Lateral [name], B[place] M ([id number]) [id number] Page 7 of 7 Cleanser Soap and Water Discharge Instruction: May shower and wash wound with dial antibacterial soap and water prior to dressing change. Wound Cleanser Discharge Instruction: Cleanse the wound with wound cleanser prior to applying a clean dressing using gauze sponges, not tissue or cotton balls. Peri-Wound Care cotton tipped applicators Topical Primary Dressing Iodoform packing strip 1/4 (in) Discharge Instruction: Lightly pack as instructed kerecis Discharge Instruction: do not remove Secondary Dressing Woven Gauze Sponge, Non-Sterile 4x4 in Discharge Instruction: Apply over primary dressing as directed. Secured With Paper Tape, 2x10 (in/yd) Discharge Instruction: Secure dressing with tape as directed. Compression Wrap Kerlix Roll 4.5x3.1 (in/yd) Discharge Instruction: Apply Kerlix and Coban compression as directed. Compression Stockings Add-Ons Electronic Signature(s) Signed: 12/17/2021 5:04:15 PM By: Sharyn Creamer RN, BSN Entered By: Sharyn Creamer on 12/17/2021 07:55:52 -------------------------------------------------------------------------------- [place] Details Patient Name: Date of Service: [name], [name] 12/17/2021 7:45 A M Medical Record Number: [id number] Patient Account Number: [id number] Date of Birth/Sex: Treating RN: 1970/02/16 (51 y.o. [name] Primary Care  Somya Jauregui: [name] Other Clinician: Referring Javari Bufkin: Treating Krimson Massmann/Extender: [name] in Treatment: 23 Vital Signs Time Taken: 07:51 Temperature (F): 98.3 Height (in): 72 Pulse (bpm): 80 Weight (lbs): 312 Respiratory Rate (breaths/min): 18 Body Mass Index (BMI): 42.3 Blood Pressure (mmHg): 142/78 Capillary Blood Glucose (mg/dl): 189 Reference  Range: 80 - 120 mg / dl Electronic Signature(s) Signed: 12/17/2021 5:04:15 PM By: Sharyn Creamer RN, BSN Entered By: Sharyn Creamer on 12/17/2021 07:52:15

## 2021-12-17 NOTE — Progress Notes (Signed)
[name], [name] ([id number]) [id number] Page 1 of 10 Visit Report for 12/17/2021 Chief Complaint Document Details Patient Name: Date of Service: [name], [place] [name] 12/17/2021 7:45 [name] Medical Record Number: [id number] Patient Account Number: [id number] Date of Birth/Sex: Treating RN: Jun 19, 1970 (51 y.o. [name] Primary Care Provider: [name] Other Clinician: Referring Provider: Treating Provider/Extender: [name] in Treatment: 23 Information Obtained from: Patient Chief Complaint Patients presents for treatment of an open diabetic ulcer Electronic Signature(s) Signed: 12/17/2021 8:05:15 AM By: [name] MD FACS Entered By: [name] on 12/17/2021 08:05:15 -------------------------------------------------------------------------------- Debridement Details Patient Name: Date of Service: [name], [name] 12/17/2021 7:45 [name] Medical Record Number: [id number] Patient Account Number: [id number] Date of Birth/Sex: Treating RN: [birth year] (51 y.o. [name] Primary Care Provider: [name] Other Clinician: Referring Provider: Treating Provider/Extender: [name] in Treatment: 23 Debridement Performed for Assessment: Wound #2 [place] Performed By: Physician [name], MD Debridement Type: Debridement Severity of Tissue Pre Debridement: Fat layer exposed Level of Consciousness (Pre-procedure): Awake and Alert Pre-procedure Verification/Time Out Yes - 07:54 Taken: Start Time: 07:59 Pain Control: Lidocaine 5% topical ointment T Area Debrided (L x W): otal 0.5 (cm) x 1.7 (cm) = 0.85 (cm) Tissue and other material debrided: Non-Viable, Callus, Slough, Subcutaneous, Slough Level: Skin/Subcutaneous Tissue Debridement Description: Excisional Instrument: Curette Bleeding: Minimum Hemostasis Achieved: Pressure Procedural Pain: 0 Post Procedural  Pain: 0 Response to Treatment: Procedure was tolerated well Level of Consciousness (Post- Awake and Alert procedure): Post Debridement Measurements of Total Wound Length: (cm) 0.5 Width: (cm) 1.7 Depth: (cm) 1 Volume: (cm) 0.668 Character of Wound/Ulcer Post Debridement: Improved Severity of Tissue Post Debridement: Fat layer exposed [name], [name] ([id number]) [id number] Page 2 of 10 Post Procedure Diagnosis Same as Pre-procedure Electronic Signature(s) Signed: 12/17/2021 8:51:08 AM By: [name] MD FACS Signed: 12/17/2021 5:04:15 PM By: Sharyn Creamer RN, BSN Entered By: Sharyn Creamer on 12/17/2021 08:01:50 -------------------------------------------------------------------------------- HPI Details Patient Name: Date of Service: [name], [name] 12/17/2021 7:45 [name] Medical Record Number: [id number] Patient Account Number: [id number] Date of Birth/Sex: Treating RN: [birth year] (51 y.o. 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 The most recent amputation was in August 2022. At that operation, antibiotic beads were placed in the wound. He has been managed by podiatry for his procedures and management of his wounds. He has been in a Water engineer. He is on oral doxycycline. They have been using Betadine wet to dry dressings along with Iodosorb. The patient states that when he thinks the wound is getting too dry, he applies topical Neosporin. At his last visit, on June 7 of this year, the podiatrist determined that he felt the wound was stalled and referred him to wound care for  additional evaluation and management. An MRI has been ordered, but not yet scheduled or performed. Pathology from his operation in August 2022 demonstrated findings consistent with chronic osteomyelitis. Today, there is a large irregular wound on the plantar surface of his right foot, at about the level of the fifth metatarsal head. This tracks through to a pinpoint opening on the dorsal lateral portion of his foot. The  intake nurse reported purulent drainage. There is some malodor from the wound. No frank necrosis identified. 07/11/2021: Today, the wounds do not connect. I attempted multiple times from various directions and the shared tunnel is no longer open. He has some slough accumulation on the dorsal part of his foot as well as slough and callus buildup on the plantar surface. His MRI is scheduled for June 29. No purulent drainage or malodor appreciated today. 07/19/2021: The lateral foot wound has closed and there is no tunnel connecting the plantar foot wound to that site. The plantar foot wound still probes quite deeply, however. There is some slough, eschar, and nonviable tissue accumulated in the wound bed. No malodorous drainage present. His MRI was performed yesterday and is consistent with fairly extensive osteomyelitis. 07/29/2021: The patient has an appointment with infectious disease on July 18 to treat his osteomyelitis. The plantar wound still probes quite deeply, approaching bone. The orifice has narrowed quite substantially, however, making it more difficult for him to pack. 08/05/2021: The tunnel connecting the lateral foot wound to the plantar foot wound has reopened. He sees infectious disease tomorrow to discuss long-term antibiotic treatment for osteomyelitis. There was a bit of murky drainage in the wound, but this was noted after he had had topical lidocaine applied so may have just been a blob of the anesthetic. No odor or frank purulent drainage. The wound probes deeply at  the midfoot approaching bone. He does have MRI results confirming his diagnosis of osteomyelitis. He has failed to progress with conventional treatment and I think his best chance for preservation of the foot is to initiate hyperbaric oxygen therapy. 08/13/2021: The tunnel connecting the 2 wounds has closed again. He has a PICC line and is getting IV daptomycin and cefepime. EKG and chest x-ray are within normal limits. He is scheduled to start hyperbaric oxygen therapy tomorrow. The wound in his midfoot probes deeply, approaching bone. The skin at the orifice continues to heaped up and threatens to close over despite the large cavity within. No erythema, induration, or purulent drainage. The wound on his lateral foot is fairly small and quite clean. 08/19/2021: The lateral foot wound has nearly closed. The wound in his midfoot does not probe quite as deeply today. The skin at the orifice continues to try to roll in and obscure the opening. No frankly necrotic tissue appreciated. He is tolerating hyperbaric oxygen therapy. 08/26/2021: The lateral foot wound has closed completely. The wound in his midfoot is shallower again today. The wound orifice is contracting. We are using gentamicin and silver alginate. He continues on IV daptomycin and cefepime and is tolerating hyperbaric oxygen therapy without difficulty. 09/02/2021: His foot is a little bit sore today but he was up walking on it all weekend doing back to school shopping with his daughter. The tunneling is down to about 3 cm and I do not appreciate bone at the tip of the probe. The wound is clean but has some callus creating an overhanging lip at the distal aspect. He continues to receive hyperbaric oxygen therapy as well as IV daptomycin and cefepime. 09/09/2021: His wound continues to improve. The tunnel has come in by 0.9 cm. He continues to form callus around the orifice of the wound. He is tolerating hyperbaric oxygen therapy along with his IV  antibiotics. 09/16/2021: The tunneling is down to just half a centimeter. He continues to build up callus around the orifice of the wound. He completed his course of IV antibiotics and  his PICC line is scheduled to be removed this Friday. He is tolerating hyperbaric oxygen without difficulty. We have been applying topical gentamicin and silver alginate to his wound. 09/24/2021: The wound depth has come in even further. There is a little bit of callus buildup around the orifice. The opening is quite narrow, at this point. 09/30/2021: The wound depth is about the same this week. The opening continues to contract with callus accumulation. There is some discoloration of the skin on [name], [name] ([id number]) [id number] Page 3 of 10 the lateral part of his foot and he admitted to walking more than usual this weekend and also wearing a different pair shoes. He continues to tolerate hyperbaric oxygen therapy without difficulty. 10/07/2021: The wound depth has contracted a bit and is now approximately 1 to 1.5 cm. The orifice of the wound continues to try and close in over the space. There is just some callus and skin around the margins obscuring the orifice. 10/14/2021: The wound depth has come in a little bit more. The orifice of the wound is rolling inwards with epithelium and callus, making it difficult to pack the wound. 10/21/2021: There is still 1 portion of the wound, at the lateral aspect, that is still a couple of centimeters deep. The architecture of the wound makes this somewhat challenging to access and pack. Everything else looks clean. He has his usual accumulation of callus and skin, narrowing the orifice. 10/29/2021: No significant change to the depth of the wound. The orifice continues to contract with callus and skin narrowing the opening. His wife has been packing the wound and doing an excellent job. 11/05/2021: The depth of the wound has come in by about a  centimeter. There is less callus accumulation around the orificedespite this, the wound is narrower today. We are still awaiting insurance approval for Kerecis powder. 11/12/2021: The depth of the wound continues to contract. He has accumulated some callus around the orifice, as usual. We have received a verbal confirmation that he is approved for Kerecis, but no formal paperwork has yet been received. 11/19/2021: The depth continues to come in. There is callus accumulation around the orifice, as usual. He has been formally approved for Kerecis, but unfortunately we do not have a billing mechanism for the powder in [facility] yet. We are working to address this so we can begin treatment. 11/25/2021: Continued filling in of the depth of the wound. Continued buildup of callus around the orifice. No concern for infection. As we do not have a way to bill for Kerecis powder, but are capable of billing for the [facility] sheet, we are going to use that on him instead. 12/03/2021: The depth of the wound continues to fill in. As usual, he has accumulated callus and skin around the orifice of the wound. No concern for infection. He will complete his hyperbaric oxygen therapy this week. 12/17/2021: The wound depth continues to contract. There is callus and skin accumulation around the orifice of the wound, as per usual. There is more epithelium on the actual wound surface. Unfortunately, his Leroy Kennedy was not ordered. Electronic Signature(s) Signed: 12/17/2021 8:06:06 AM By: [name] MD FACS Entered By: [name] on 12/17/2021 08:06:06 -------------------------------------------------------------------------------- Physical Exam Details Patient Name: Date of Service: [name], [name] 12/17/2021 7:45 [name] Medical Record Number: [id number] Patient Account Number: [id number] Date of Birth/Sex: Treating RN: 1970/06/14 (51 y.o. [name] Primary Care Provider: [name] Other Clinician: Referring  Provider: Treating Provider/Extender: Alroy Bailiff  Weeks in Treatment: 23 Constitutional Slightly hypertensive. . . . No acute distress. Respiratory Normal work of breathing on room air. Notes 12/17/2021: The wound depth continues to contract. There is callus and skin accumulation around the orifice of the wound, as per usual. There is more epithelium on the actual wound surface. Electronic Signature(s) Signed: 12/17/2021 8:06:52 AM By: [name] MD FACS Entered By: [name] on 12/17/2021 08:06:52 -------------------------------------------------------------------------------- Physician Orders Details Patient Name: Date of Service: [name], [name] 12/17/2021 7:45 [name] Medical Record Number: [id number] Patient Account Number: [id number] [name], [name] ([id number]) [id number] Page 4 of 10 Date of Birth/Sex: Treating RN: 1970/11/08 (51 y.o. 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 - Dr Celine Ahr Room 2 Return A Anesthetic Wound #2 Right,Lateral,Plantar Foot (In clinic) Topical Lidocaine 4% applied to wound bed Cellular or Tissue Based Products Cellular or Tissue Based Product Type: - Kerecis #2, resume Valora Piccolo 11/24/21 Cellular or Tissue Based Product applied to wound bed, secured with steri-strips, cover with Adaptic or Mepitel. (DO NOT REMOVE). Bathing/ Shower/ Hygiene May shower with protection but do not get wound dressing(s)  wet. Off-Loading Other: - Try to not stand on your feet too much Hyperbaric Oxygen Therapy Evaluate for HBO Therapy Indication: - wagner grade 3 diabetic foot ulcer right foot 2.0 ATA for 90 Minutes without A Breaks ir Total Number of Treatments: - 40 more treatments for a total of 80 09/30/21 One treatments per day (delivered Monday through Friday unless otherwise specified in Special Instructions below): Finger stick Blood Glucose Pre- and Post- HBOT Treatment. Follow Hyperbaric Oxygen Glycemia Protocol Afrin (Oxymetazoline HCL) 0.05% nasal spray - 1 spray in both nostrils daily as needed prior to HBO treatment for difficulty clearing ears Wound Treatment Wound #2 - Foot Wound Laterality: Plantar, Right, Lateral Cleanser: Soap and Water Every Other Day/15 Days Discharge Instructions: May shower and wash wound with dial antibacterial soap and water prior to dressing change. Cleanser: Wound Cleanser (Generic) Every Other Day/15 Days Discharge Instructions: Cleanse the wound with wound cleanser prior to applying a clean dressing using gauze sponges, not tissue or cotton balls. Peri-Wound Care: cotton tipped applicators (Generic) Every Other Day/15 Days Prim Dressing: Iodoform packing strip 1/4 (in) Every Other Day/15 Days ary Discharge Instructions: Lightly pack as instructed Prim Dressing: kerecis Every Other Day/15 Days ary Discharge Instructions: do not remove Secondary Dressing: Woven Gauze Sponge, Non-Sterile 4x4 in Every Other Day/15 Days Discharge Instructions: Apply over primary dressing as directed. Secured With: Paper Tape, 2x10 (in/yd) (Generic) Every Other Day/15 Days Discharge Instructions: Secure dressing with tape as directed. Compression Wrap: Kerlix Roll 4.5x3.1 (in/yd) (Generic) Every Other Day/15 Days Discharge Instructions: Apply Kerlix and Coban compression as directed. Patient Medications llergies: No Known Allergies A Notifications Medication Indication Start  End prior to debridement 12/17/2021 lidocaine DOSE topical 5 % ointment - ointment topical once daily [name], [name] ([id number]) [id number] Page 5 of 10 GLYCEMIA INTERVENTIONS PROTOCOL PRE-HBO GLYCEMIA INTERVENTIONS ACTION INTERVENTION Obtain pre-HBO capillary blood glucose (ensure 1 physician order is in chart). A. Notify HBO physician and await physician orders. 2 If result is 70 mg/dl or  below: B. If the result meets the hospital definition of a critical result, follow hospital policy. A. Give patient an 8 ounce Glucerna Shake, an 8 ounce Ensure, or 8 ounces of a Glucerna/Ensure equivalent dietary supplement*. B. Wait 30 minutes. If result is 71 mg/dl to 130 mg/dl: C. Retest patients capillary blood glucose (CBG). D. If result greater than or equal to 110 mg/dl, proceed with HBO. If result less than 110 mg/dl, notify HBO physician and consider holding HBO. If result is 131 mg/dl to 249 mg/dl: A. Proceed with HBO. A. Notify HBO physician and await physician orders. B. It is recommended to hold HBO and do If result is 250 mg/dl or greater: blood/urine ketone testing. C. If the result meets the hospital definition of a critical result, follow hospital policy. POST-HBO GLYCEMIA INTERVENTIONS ACTION INTERVENTION Obtain post HBO capillary blood glucose (ensure 1 physician order is in chart). A. Notify HBO physician and await physician orders. 2 If result is 70 mg/dl or below: B. If the result meets the hospital definition of a critical result, follow hospital policy. A. Give patient an 8 ounce Glucerna Shake, an 8 ounce Ensure, or 8 ounces of a Glucerna/Ensure equivalent dietary supplement*. B. Wait 15 minutes for symptoms of If result is 71 mg/dl to 100 mg/dl: hypoglycemia (i.[name] nervousness, anxiety, sweating, chills, clamminess, irritability, confusion, tachycardia or dizziness). C. If patient asymptomatic, discharge patient. If  patient symptomatic, repeat capillary blood glucose (CBG) and notify HBO physician. If result is 101 mg/dl to 249 mg/dl: A. Discharge patient. A. Notify HBO physician and await physician orders. B. It is recommended to do blood/urine ketone If result is 250 mg/dl or greater: testing. C. If the result meets the hospital definition of a critical result, follow hospital policy. *Juice or candies are NOT equivalent products. If patient refuses the Glucerna or Ensure, please consult the hospital dietitian for an appropriate substitute. Electronic Signature(s) Signed: 12/17/2021 8:51:08 AM By: [name] MD FACS Entered By: [name] on 12/17/2021 08:07:06 -------------------------------------------------------------------------------- Problem List Details Patient Name: Date of Service: [name], [name] 12/17/2021 7:45 [name] Medical Record Number: [id number] Patient Account Number: [id number] Date of Birth/Sex: Treating RN: 1971/01/08 (51 y.o. 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 12/17/2021 7:45 A  [name] Medical Record Number: [id number] Patient Account Number: [id number] Date of Birth/Sex: Treating RN: [birth year] (51 y.o. 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 The most recent amputation was in August 2022. At that operation, antibiotic beads  were placed in the wound. He has been managed by podiatry for his procedures and management of his wounds. He has been in a Water engineer. He is on oral doxycycline. They have been using Betadine wet to dry dressings along with Iodosorb. The patient states that when he thinks the wound is getting too dry, he applies topical Neosporin. At his last visit, on June 7 of this year, the podiatrist determined that he felt the wound was stalled and referred him to wound care for additional evaluation and management. An MRI has been ordered, but not yet scheduled or performed. Pathology from his operation in August 2022 demonstrated findings consistent with chronic osteomyelitis. Today, there is a large irregular wound on the plantar surface of his right foot, at about the level of the fifth metatarsal head. This tracks through to a pinpoint opening on the dorsal lateral portion of his foot. The intake nurse reported purulent drainage. There is some malodor from the wound. No frank necrosis identified. 07/11/2021: Today, the wounds do not connect. I attempted multiple times from various directions and the shared tunnel is no longer open. He has some slough accumulation on the dorsal part  of his foot as well as slough and callus buildup on the plantar surface. His MRI is scheduled for June 29. No purulent drainage or malodor appreciated today. 07/19/2021: The lateral foot wound has closed and there is no tunnel connecting the plantar foot wound to that site. The plantar foot wound still probes quite deeply, however. There is some slough, eschar, and nonviable tissue accumulated in the wound bed. No malodorous drainage present. His MRI was performed yesterday and is consistent with fairly extensive osteomyelitis. 07/29/2021: The patient has an appointment with infectious disease on July 18 to treat his osteomyelitis. The plantar wound still probes quite deeply, approaching bone. The orifice has narrowed quite substantially, however, making it more difficult for him to pack. 08/05/2021: The tunnel connecting the lateral foot wound to the plantar foot wound has reopened. He sees infectious disease tomorrow to discuss long-term antibiotic treatment for osteomyelitis. There was a bit of murky drainage in the wound, but this was noted after he had had topical lidocaine applied so may have just been a blob of the anesthetic. No odor or frank purulent drainage. The wound probes deeply at the midfoot approaching bone. He does have MRI results confirming his diagnosis of osteomyelitis. He has failed to progress with conventional treatment and I think his best chance for preservation of the foot is to initiate hyperbaric oxygen therapy. 08/13/2021: The tunnel connecting the 2 wounds has closed again. He has a PICC line and is getting IV daptomycin and cefepime. EKG and chest x-ray are within normal limits. He is scheduled to start hyperbaric oxygen therapy tomorrow. The wound in his midfoot probes deeply, approaching bone. The skin at the orifice continues to heaped up and threatens to close over despite the large cavity within. No erythema, induration, or purulent drainage. The wound on his lateral  foot is fairly small and quite clean. [name], [name] ([id number]) [id number] Page 7 of 10 08/19/2021: The lateral foot wound has nearly closed. The wound in his midfoot does not probe quite as deeply today. The skin at the orifice continues to try to roll in and obscure the opening. No frankly necrotic tissue appreciated. He is tolerating hyperbaric oxygen therapy. 08/26/2021: The lateral foot wound has closed completely. The wound in his midfoot is shallower again today. The wound orifice  is contracting. We are using gentamicin and silver alginate. He continues on IV daptomycin and cefepime and is tolerating hyperbaric oxygen therapy without difficulty. 09/02/2021: His foot is a little bit sore today but he was up walking on it all weekend doing back to school shopping with his daughter. The tunneling is down to about 3 cm and I do not appreciate bone at the tip of the probe. The wound is clean but has some callus creating an overhanging lip at the distal aspect. He continues to receive hyperbaric oxygen therapy as well as IV daptomycin and cefepime. 09/09/2021: His wound continues to improve. The tunnel has come in by 0.9 cm. He continues to form callus around the orifice of the wound. He is tolerating hyperbaric oxygen therapy along with his IV antibiotics. 09/16/2021: The tunneling is down to just half a centimeter. He continues to build up callus around the orifice of the wound. He completed his course of IV antibiotics and his PICC line is scheduled to be removed this Friday. He is tolerating hyperbaric oxygen without difficulty. We have been applying topical gentamicin and silver alginate to his wound. 09/24/2021: The wound depth has come in even further. There is a little bit of callus buildup around the orifice. The opening is quite narrow, at this point. 09/30/2021: The wound depth is about the same this week. The opening continues to contract with callus accumulation.  There is some discoloration of the skin on the lateral part of his foot and he admitted to walking more than usual this weekend and also wearing a different pair shoes. He continues to tolerate hyperbaric oxygen therapy without difficulty. 10/07/2021: The wound depth has contracted a bit and is now approximately 1 to 1.5 cm. The orifice of the wound continues to try and close in over the space. There is just some callus and skin around the margins obscuring the orifice. 10/14/2021: The wound depth has come in a little bit more. The orifice of the wound is rolling inwards with epithelium and callus, making it difficult to pack the wound. 10/21/2021: There is still 1 portion of the wound, at the lateral aspect, that is still a couple of centimeters deep. The architecture of the wound makes this somewhat challenging to access and pack. Everything else looks clean. He has his usual accumulation of callus and skin, narrowing the orifice. 10/29/2021: No significant change to the depth of the wound. The orifice continues to contract with callus and skin narrowing the opening. His wife has been packing the wound and doing an excellent job. 11/05/2021: The depth of the wound has come in by about a centimeter. There is less callus accumulation around the orificeoodespite this, the wound is narrower today. We are still awaiting insurance approval for Kerecis powder. 11/12/2021: The depth of the wound continues to contract. He has accumulated some callus around the orifice, as usual. We have received a verbal confirmation that he is approved for Kerecis, but no formal paperwork has yet been received. 11/19/2021: The depth continues to come in. There is callus accumulation around the orifice, as usual. He has been formally approved for Kerecis, but unfortunately we do not have a billing mechanism for the powder in [employer] yet. We are working to address this so we can begin treatment. 11/25/2021: Continued filling in  of the depth of the wound. Continued buildup of callus around the orifice. No concern for infection. As we do not have a way to bill for Kerecis powder, but are capable  of billing for the [facility] sheet, we are going to use that on him instead. 12/03/2021: The depth of the wound continues to fill in. As usual, he has accumulated callus and skin around the orifice of the wound. No concern for infection. He will complete his hyperbaric oxygen therapy this week. 12/17/2021: The wound depth continues to contract. There is callus and skin accumulation around the orifice of the wound, as per usual. There is more epithelium on the actual wound surface. Unfortunately, his Leroy Kennedy was not ordered. Patient History Information obtained from Patient. Social History Never smoker, Marital Status - Married, Alcohol Use - Never, Drug Use - No History, Caffeine Use - Daily - T coffee. ea; Medical History Endocrine Patient has history of Type II Diabetes Hospitalization/Surgery History - Amuptation of 3rd,4th and 5th toes of Right foot;Oral Surgery;Anal Fissure surgery; Cholecystectomy. Objective Constitutional Slightly hypertensive. No acute distress. Vitals Time Taken: 7:51 AM, Height: 72 in, Weight: 312 lbs, BMI: 42.3, Temperature: 98.3 F, Pulse: 80 bpm, Respiratory Rate: 18 breaths/min, Blood Pressure: 142/78 mmHg, Capillary Blood Glucose: 189 mg/dl. Respiratory Normal work of breathing on room air. General Notes: 12/17/2021: The wound depth continues to contract. There is callus and skin accumulation around the orifice of the wound, as per usual. There is more epithelium on the actual wound surface. [name], [name] ([id number]) [id number] Page 8 of 10 Integumentary (Hair, Skin) Wound #2 status is Open. Original cause of wound was Gradually Appeared. The date acquired was: 12/19/2016. The wound has been in treatment 23 weeks. The wound is located on the [place]. The wound measures 0.5cm length x 1.7cm width x 1cm depth; 0.668cm^2 area and 0.668cm^3 volume. There is Fat Layer (Subcutaneous Tissue) exposed. There is no tunneling or undermining noted. There is a medium amount of serosanguineous drainage noted. The wound margin is thickened. There is large (67-100%) red granulation within the wound bed. There is a small (1-33%) amount of necrotic tissue within the wound bed including Adherent Slough. The periwound skin appearance had no abnormalities noted for moisture. The periwound skin appearance had no abnormalities noted for color. The periwound skin appearance exhibited: Callus. Periwound temperature was noted as No Abnormality. 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 There was a Excisional Skin/Subcutaneous Tissue Debridement with a total area of 0.85 sq cm performed by [name], MD. With the following instrument(s): Curette to remove Non-Viable tissue/material. Material removed includes Callus, Subcutaneous Tissue, and Slough after achieving pain control using Lidocaine 5% topical ointment. No specimens were taken. A time out was conducted at 07:54, prior to the start of the procedure. A Minimum amount of bleeding was controlled with Pressure. The procedure was tolerated well with a pain level of 0 throughout and a pain level of 0 following the procedure. Post Debridement Measurements: 0.5cm length x 1.7cm width x 1cm depth; 0.668cm^3 volume. Character of Wound/Ulcer Post Debridement is improved. Severity of Tissue Post Debridement is: Fat layer  exposed. Post procedure Diagnosis Wound #2: Same as Pre-Procedure Plan Follow-up Appointments: Return Appointment in 1 week. - Dr Celine Ahr Room 2 Anesthetic: Wound #2 Right,Lateral,Plantar Foot: (In clinic) Topical Lidocaine 4% applied to wound bed Cellular or Tissue  Based Products: Cellular or Tissue Based Product Type: - Kerecis #2, resume Valora Piccolo 11/24/21 Cellular or Tissue Based Product applied to wound bed, secured with steri-strips, cover with Adaptic or Mepitel. (DO NOT REMOVE). Bathing/ Shower/ Hygiene: May shower with protection but do not get wound dressing(s) wet. Off-Loading: Other: - Try to not stand on your feet too much Hyperbaric Oxygen Therapy: Evaluate for HBO Therapy Indication: - wagner grade 3 diabetic foot ulcer right foot 2.0 ATA for 90 Minutes without Air Breaks T Number of Treatments: - 40 more treatments for a total of 80 09/30/21 otal One treatments per day (delivered Monday through Friday unless otherwise specified in Special Instructions below): Finger stick Blood Glucose Pre- and Post- HBOT Treatment. Follow Hyperbaric Oxygen Glycemia Protocol Afrin (Oxymetazoline HCL) 0.05% nasal spray - 1 spray in both nostrils daily as needed prior to HBO treatment for difficulty clearing ears The following medication(s) was prescribed: lidocaine topical 5 % ointment ointment topical once daily for prior to debridement was prescribed at facility WOUND #2: - Foot Wound Laterality: Plantar, Right, Lateral Cleanser: Soap and Water Every Other Day/15 Days Discharge Instructions: May shower and wash wound with dial antibacterial soap and water prior to dressing change. Cleanser: Wound Cleanser (Generic) Every Other Day/15 Days Discharge Instructions: Cleanse the wound with wound cleanser prior to applying a clean dressing using gauze sponges, not tissue or cotton balls. Peri-Wound Care: cotton tipped applicators (Generic) Every Other Day/15 Days Prim Dressing: Iodoform packing  strip 1/4 (in) Every Other Day/15 Days ary Discharge Instructions: Lightly pack as instructed Prim Dressing: kerecis Every Other Day/15 Days ary Discharge Instructions: do not remove Secondary Dressing: Woven Gauze Sponge, Non-Sterile 4x4 in Every Other Day/15 Days Discharge Instructions: Apply over primary dressing as directed. Secured With: Paper T ape, 2x10 (in/yd) (Generic) Every Other Day/15 Days Discharge Instructions: Secure dressing with tape as directed. Com pression Wrap: Kerlix Roll 4.5x3.1 (in/yd) (Generic) Every Other Day/15 Days Discharge Instructions: Apply Kerlix and Coban compression as directed. [name], [name] ([id number]) [id number] Page 9 of 10 12/17/2021: The wound depth continues to contract. There is callus and skin accumulation around the orifice of the wound, as per usual. There is more epithelium on the actual wound surface. I used a curette to debride slough, callus, skin, and nonviable subcutaneous tissue from the wound. As we do not have a Kerecis skin substitute today, we will have him go back to packing the wound with iodoform packing strips for the next week. We will make sure we have a skin substitute in clinic for him at his clinic visit next week. Electronic Signature(s) Signed: 12/17/2021 8:07:50 AM By: [name] MD FACS Entered By: [name] on 12/17/2021 08:07:50 -------------------------------------------------------------------------------- HxROS Details Patient Name: Date of Service: [name], [name] 12/17/2021 7:45 [name] Medical Record Number: [id number] Patient Account Number: [id number] Date of Birth/Sex: Treating RN: 05-23-1970 (51 y.o. [name] Primary Care Provider: [name] Other Clinician: Referring Provider: Treating Provider/Extender: [name] in Treatment: 23 Information Obtained From Patient Endocrine Medical History: Positive for: Type II  Diabetes Immunizations Pneumococcal Vaccine: Received Pneumococcal Vaccination: Yes Received Pneumococcal Vaccination On or After 60th Birthday: No Implantable Devices None Hospitalization / Surgery History Type of Hospitalization/Surgery Amuptation of 3rd,4th and 5th toes of Right foot;Oral Surgery;Anal Fissure surgery; Cholecystectomy Family and Social History Never smoker; Marital Status - Married; Alcohol Use: Never; Drug Use: No History; Caffeine Use: Daily - T coffee; Financial Concerns: No; Food, ea; Clothing or Shelter Needs: No;  Support System Lacking: No; Transportation Concerns: No Electronic Signature(s) Signed: 12/17/2021 8:51:08 AM By: [name] MD FACS Entered By: [name] on 12/17/2021 08:06:11 -------------------------------------------------------------------------------- SuperBill Details Patient Name: Date of Service: [name], [name] 12/17/2021 Medical Record Number: [id number] Patient Account Number: [id number] Date of Birth/Sex: Treating RN: February 20, 1970 (51 y.o. [name] Primary Care Provider: [name] Other Clinician: Referring Provider: Treating Provider/Extender: Kionna Brier Hunter, Stephen [name], [name] ([id number]) [id number] Page 10 of 10 Weeks in Treatment: 23 Diagnosis Coding ICD-10 Codes Code Description E11.49 Type 2 diabetes mellitus with other diabetic neurological complication N82.956 Other chronic osteomyelitis, right ankle and foot L97.514 Non-pressure chronic ulcer of other part of right foot with necrosis of bone Facility Procedures : CPT4 Code: [id number] Description: 84696 - DEB SUBQ TISSUE 20 SQ CM/< ICD-10 Diagnosis Description L97.514 Non-pressure chronic ulcer of other part of right foot with necrosis of bone Modifier: Quantity: 1 Physician Procedures : CPT4 Code Description Modifier [id number] - WC PHYS LEVEL 4 - EST PT 25 ICD-10 Diagnosis Description L97.514 Non-pressure  chronic ulcer of other part of right foot with necrosis of bone M86.671 Other chronic osteomyelitis, right ankle and foot  E11.49 Type 2 diabetes mellitus with other diabetic neurological complication Quantity: 1 : [id number] 11042 - WC PHYS SUBQ TISS 20 SQ CM ICD-10 Diagnosis Description L97.514 Non-pressure chronic ulcer of other part of right foot with necrosis of bone Quantity: 1 Electronic Signature(s) Signed: 12/17/2021 8:08:08 AM By: [name] MD FACS Entered By: [name] on 12/17/2021 08:08:07

## 2021-12-18 ENCOUNTER — Ambulatory Visit: Payer: BLUE CROSS/BLUE SHIELD | Admitting: Family Medicine

## 2021-12-24 ENCOUNTER — Encounter (HOSPITAL_BASED_OUTPATIENT_CLINIC_OR_DEPARTMENT_OTHER): Payer: BLUE CROSS/BLUE SHIELD | Attending: General Surgery | Admitting: General Surgery

## 2021-12-24 DIAGNOSIS — I10 Essential (primary) hypertension: Secondary | ICD-10-CM | POA: Insufficient documentation

## 2021-12-24 DIAGNOSIS — L97514 Non-pressure chronic ulcer of other part of right foot with necrosis of bone: Secondary | ICD-10-CM | POA: Insufficient documentation

## 2021-12-24 DIAGNOSIS — E1149 Type 2 diabetes mellitus with other diabetic neurological complication: Secondary | ICD-10-CM | POA: Diagnosis not present

## 2021-12-24 DIAGNOSIS — E11621 Type 2 diabetes mellitus with foot ulcer: Secondary | ICD-10-CM | POA: Insufficient documentation

## 2021-12-24 NOTE — Progress Notes (Signed)
[name], Grayson Sharon ([id number]) [id number] Page 1 of 7 Visit Report for 12/24/2021 Arrival Information Details Patient Name: Date of Service: [place], [place] [name] 12/24/2021 7:45 A M Medical Record Number: [id number] Patient Account Number: [id number] Date of Birth/Sex: Treating RN: May 29, 1970 (51 y.o. [name] Primary Care [place] Urieta: [name] Other Clinician: Referring Vernette Moise: Treating Clotile Whittington/Extender: [name] in Treatment: 24 Visit Information History Since Last Visit Added or deleted any medications: No Patient Arrived: Ambulatory Any new allergies or adverse reactions: No Arrival Time: 07:45 Had a fall or experienced change in No Accompanied By: self activities of daily living that may affect Transfer Assistance: None risk of falls: Patient Identification Verified: Yes Signs or symptoms of abuse/neglect since last visito No Secondary Verification Process Completed: Yes Hospitalized since last visit: No Patient Requires Transmission-Based Precautions: No Implantable device outside of the clinic excluding No Patient Has Alerts: Yes cellular tissue based products placed in the center since last visit: Has Dressing in Place as Prescribed: Yes Pain Present Now: No Electronic Signature(s) Signed: 12/24/2021 4:17:50 PM By: Adline Peals Entered By: Adline Peals on 12/24/2021 07:46:02 -------------------------------------------------------------------------------- Encounter Discharge Information Details Patient Name: Date of Service: [place], [name] 12/24/2021 7:45 A M Medical Record Number: [id number] Patient Account Number: [id number] Date of Birth/Sex: Treating RN: Oct 20, 1970 (51 y.o. [name] Primary Care Donaldo Teegarden: [name] Other Clinician: Referring Jamiyah Dingley: Treating Milbert Bixler/Extender: [name] in Treatment: 24 Encounter Discharge  Information Items Post Procedure Vitals Discharge Condition: Stable Temperature (F): 99.2 Ambulatory Status: Ambulatory Pulse (bpm): 82 Discharge Destination: Home Respiratory Rate (breaths/min): 18 Transportation: Private Auto Blood Pressure (mmHg): 159/91 Accompanied By: self Schedule Follow-up Appointment: Yes Clinical Summary of Care: Patient Declined Electronic Signature(s) Signed: 12/24/2021 4:17:50 PM By: Adline Peals Entered By: Adline Peals on 12/24/2021 08:12:58 [name], Obediah M ([id number]) [id number] Page 2 of 7 -------------------------------------------------------------------------------- Lower Extremity Assessment Details Patient Name: Date of Service: [name], [name] 12/24/2021 7:45 A M Medical Record Number: [id number] Patient Account Number: [id number] Date of Birth/Sex: Treating RN: [birth year] (51 y.o. [name] Primary Care Issac Moure: [name] Other Clinician: Referring Baylon Santelli: Treating Addison Whidbee/Extender: [name] in Treatment: 24 Edema Assessment Assessed: [name]: No] [name]: No] Edema: [Left: N] [Right: o] Calf Left: Right: Point of Measurement: From Medial Instep 39 cm Ankle Left: Right: Point of Measurement: From Medial Instep 23 cm Electronic Signature(s) Signed: 12/24/2021 4:17:50 PM By: Adline Peals Entered By: Adline Peals on 12/24/2021 07:48:20 -------------------------------------------------------------------------------- Multi Wound Chart Details Patient Name: Date of Service: [name], [name] 12/24/2021 7:45 A M Medical Record Number: [id number] Patient Account Number: [id number] Date of Birth/Sex: Treating RN: [birth year] (51 y.o. M) Primary Care Markham Dumlao: [name] Other Clinician: Referring Divit Stipp: Treating Rosibel Giacobbe/Extender: [name] in Treatment: 24 Vital Signs Height(in): 72 Capillary Blood  Glucose(mg/dl): 164 Weight(lbs): 312 Pulse(bpm): 82 Body Mass Index(BMI): 42.3 Blood Pressure(mmHg): 159/91 Temperature(F): 99.2 Respiratory Rate(breaths/min): 18 [2:Photos:] [N/A:N/A] Right, Lateral, Plantar Foot N/A N/A Wound Location: Gradually Appeared N/A N/A Wounding Event: Diabetic Wound/Ulcer of the Lower N/A N/A Primary Etiology: Extremity Type II Diabetes N/A N/A Comorbid History: 12/19/2016 N/A N/A Date Acquired: [name], [place] M ([id number]) [id number] Page 3 of 7 24 N/A N/A Weeks of Treatment: Open N/A N/A Wound Status: No N/A N/A Wound Recurrence: 0.2x1.5x0.9 N/A N/A Measurements L x W x D (cm) 0.236 N/A N/A A (cm) : rea 0.212 N/A N/A Volume (cm) : 93.50% N/A N/A %  Reduction in A rea: 98.80% N/A N/A % Reduction in Volume: Grade 3 N/A N/A Classification: Medium N/A N/A Exudate A mount: Serosanguineous N/A N/A Exudate Type: red, brown N/A N/A Exudate Color: Thickened N/A N/A Wound Margin: Large (67-100%) N/A N/A Granulation A mount: Red N/A N/A Granulation Quality: Small (1-33%) N/A N/A Necrotic A mount: Fat Layer (Subcutaneous Tissue): Yes N/A N/A Exposed Structures: Fascia: No Tendon: No Muscle: No Joint: No Bone: No Small (1-33%) N/A N/A Epithelialization: Debridement - Selective/Open Wound N/A N/A Debridement: Pre-procedure Verification/Time Out 07:53 N/A N/A Taken: Lidocaine 4% T opical Solution N/A N/A Pain Control: Callus, Slough N/A N/A Tissue Debrided: Skin/Epidermis N/A N/A Level: 0.3 N/A N/A Debridement A (sq cm): rea Curette N/A N/A Instrument: Minimum N/A N/A Bleeding: Pressure N/A N/A Hemostasis A chieved: Procedure was tolerated well N/A N/A Debridement Treatment Response: 0.2x1.5x0.9 N/A N/A Post Debridement Measurements L x W x D (cm) 0.212 N/A N/A Post Debridement Volume: (cm) Callus: Yes N/A N/A Periwound Skin Texture: No Abnormalities Noted N/A N/A Periwound Skin  Moisture: No Abnormalities Noted N/A N/A Periwound Skin Color: No Abnormality N/A N/A Temperature: Cellular or Tissue Based Product N/A N/A Procedures Performed: Debridement Treatment Notes Electronic Signature(s) Signed: 12/24/2021 8:02:58 AM By: [name] MD FACS Entered By: [name] on 12/24/2021 08:02:58 -------------------------------------------------------------------------------- Multi-Disciplinary Care Plan Details Patient Name: Date of Service: [name], [name] 12/24/2021 7:45 A M Medical Record Number: [id number] Patient Account Number: [id number] Date of Birth/Sex: Treating RN: 23-Oct-1970 (51 y.o. [name] Primary Care Morell Mears: [name] Other Clinician: Referring Holy Battenfield: Treating Valerya Maxton/Extender: [name] in Treatment: [place] reviewed with physician Active Inactive Nutrition Nursing Diagnoses: Impaired glucose control: actual or potential Potential for alteratiion in Nutrition/Potential for imbalanced nutrition Goals: [name], Wali M ([id number]) [phone].pdf Page 4 of 7 Patient/caregiver will maintain therapeutic glucose control Date Initiated: 07/29/2021 Target Resolution Date: 02/21/2022 Goal Status: Active Interventions: Assess patient nutrition upon admission and as needed per policy Provide education on elevated blood sugars and impact on wound healing Treatment Activities: Patient referred to Primary Care Physician for further nutritional evaluation : 07/29/2021 Notes: Osteomyelitis Nursing Diagnoses: Infection: osteomyelitis Knowledge deficit related to disease process and management Goals: Patient/caregiver will verbalize understanding of disease process and disease management Date Initiated: 07/29/2021 Date Inactivated: 11/12/2021 Target Resolution Date: 11/19/2021 Goal Status: Met Patient's osteomyelitis will resolve Date Initiated:  07/29/2021 Target Resolution Date: 02/21/2022 Goal Status: Active Interventions: Assess for signs and symptoms of osteomyelitis resolution every visit Provide education on osteomyelitis Treatment Activities: Consult for HBO : 07/29/2021 Notes: Electronic Signature(s) Signed: 12/24/2021 4:17:50 PM By: Adline Peals Entered By: Adline Peals on 12/24/2021 08:00:31 -------------------------------------------------------------------------------- Pain Assessment Details Patient Name: Date of Service: [place], [name] 12/24/2021 7:45 A M Medical Record Number: [id number] Patient Account Number: [id number] Date of Birth/Sex: Treating RN: 1971-01-07 (51 y.o. [name] Primary Care Doniqua Saxby: [name] Other Clinician: Referring Aysa Larivee: Treating Ji Feldner/Extender: [name] in Treatment: 24 Active Problems Location of Pain Severity and Description of Pain Patient Has Paino No Site Locations Rate the pain. [name], Jowel Dupont ([id number]) [id number] Page 5 of 7 Rate the pain. Current Pain Level: 0 Pain Management and Medication Current Pain Management: Electronic Signature(s) Signed: 12/24/2021 4:17:50 PM By: Adline Peals Entered By: Adline Peals on 12/24/2021 07:46:46 -------------------------------------------------------------------------------- Patient/Caregiver Education Details Patient Name: Date of Service: [place], [name] 12/5/2023andnbsp7:45 A M Medical Record Number: [id number] Patient Account Number: [id number] Date of  Birth/Gender: Treating RN: 1970/03/02 (51 y.o. [name] Primary Care Physician: [name] Other Clinician: Referring Physician: Treating Physician/Extender: [name] in Treatment: 24 Education Assessment Education Provided To: Patient Education Topics Provided Wound/Skin Impairment: Methods:  Explain/Verbal Responses: Reinforcements needed, State content correctly Electronic Signature(s) Signed: 12/24/2021 4:17:50 PM By: Adline Peals Entered By: Adline Peals on 12/24/2021 08:00:42 -------------------------------------------------------------------------------- Wound Assessment Details Patient Name: Date of Service: [place], [name] 12/24/2021 7:45 A M Medical Record Number: [id number] Patient Account Number: [id number] Date of Birth/Sex: Treating RN: Mar 26, 1970 (51 y.o. [name] Primary Care Joel Mericle: [name] Other Clinician: Referring Elim Peale: Treating Pasquale Matters/Extender: Alroy Bailiff [name], [place] ([id number]) [id number] Page 6 of 7 Weeks in Treatment: 24 Wound Status Wound Number: 2 Primary Etiology: Diabetic Wound/Ulcer of the Lower Extremity Wound Location: Right, Lateral, Plantar Foot Wound Status: Open Wounding Event: Gradually Appeared Comorbid History: Type II Diabetes Date Acquired: 12/19/2016 Weeks Of Treatment: 24 Clustered Wound: No Photos Wound Measurements Length: (cm) 0.2 Width: (cm) 1.5 Depth: (cm) 0.9 Area: (cm) 0.236 Volume: (cm) 0.212 % Reduction in Area: 93.5% % Reduction in Volume: 98.8% Epithelialization: Small (1-33%) Tunneling: No Undermining: No Wound Description Classification: Grade 3 Wound Margin: Thickened Exudate Amount: Medium Exudate Type: Serosanguineous Exudate Color: red, brown Foul Odor After Cleansing: No Slough/Fibrino Yes Wound Bed Granulation Amount: Large (67-100%) Exposed Structure Granulation Quality: Red Fascia Exposed: No Necrotic Amount: Small (1-33%) Fat Layer (Subcutaneous Tissue) Exposed: Yes Necrotic Quality: Adherent Slough Tendon Exposed: No Muscle Exposed: No Joint Exposed: No Bone Exposed: No Periwound Skin Texture Texture Color No Abnormalities Noted: No No Abnormalities Noted: Yes Callus: Yes Temperature /  Pain Temperature: No Abnormality Moisture No Abnormalities Noted: Yes Treatment Notes Wound #2 (Foot) Wound Laterality: Plantar, Right, Lateral Cleanser Soap and Water Discharge Instruction: May shower and wash wound with dial antibacterial soap and water prior to dressing change. Wound Cleanser Discharge Instruction: Cleanse the wound with wound cleanser prior to applying a clean dressing using gauze sponges, not tissue or cotton balls. Peri-Wound Care cotton tipped applicators Topical Primary Dressing kerecis Discharge Instruction: do not remove [name], Shykeem M ([id number]) [id number] Page 7 of 7 Secondary Dressing ADAPTIC TOUCH 3x4.25 in Discharge Instruction: Apply over primary dressing as directed. Woven Gauze Sponge, Non-Sterile 4x4 in Discharge Instruction: Apply over primary dressing as directed. Secured With Paper Tape, 2x10 (in/yd) Discharge Instruction: Secure dressing with tape as directed. Compression Wrap Kerlix Roll 4.5x3.1 (in/yd) Discharge Instruction: Apply Kerlix and Coban compression as directed. Compression Stockings Add-Ons Electronic Signature(s) Signed: 12/24/2021 4:17:50 PM By: Adline Peals Entered By: Adline Peals on 12/24/2021 07:50:43 -------------------------------------------------------------------------------- Vitals Details Patient Name: Date of Service: [name], [name] 12/24/2021 7:45 A M Medical Record Number: [id number] Patient Account Number: [id number] Date of Birth/Sex: Treating RN: March 19, 1970 (51 y.o. [name] Primary Care Zakk Borgen: [name] Other Clinician: Referring Bladen Umar: Treating Lon Klippel/Extender: [name] in Treatment: 24 Vital Signs Time Taken: 07:46 Temperature (F): 99.2 Height (in): 72 Pulse (bpm): 82 Weight (lbs): 312 Respiratory Rate (breaths/min): 18 Body Mass Index (BMI): 42.3 Blood Pressure (mmHg): 159/91 Capillary  Blood Glucose (mg/dl): 164 Reference Range: 80 - 120 mg / dl Electronic Signature(s) Signed: 12/24/2021 4:17:50 PM By: Adline Peals Entered By: Adline Peals on 12/24/2021 07:46:40

## 2021-12-24 NOTE — Progress Notes (Addendum)
[name], [name] [name] ([id number]) [id number] Page 1 of 10 Visit Report for 12/24/2021 Chief Complaint Document Details Patient Name: Date of Service: [name], [place] [name] 12/24/2021 7:45 A M Medical Record Number: [id number] Patient Account Number: [id number] Date of Birth/Sex: Treating RN: Sep 28, 1970 (51 y.o. M) Primary Care Provider: Garret [name] Other Clinician: Referring Provider: Treating Provider/Extender: [name] in Treatment: 24 Information Obtained from: Patient Chief Complaint Patients presents for treatment of an open diabetic ulcer Electronic Signature(s) Signed: 12/24/2021 8:03:46 AM By: [name] Maudlin MD FACS Entered By: [name] on 12/24/2021 08:03:46 -------------------------------------------------------------------------------- Cellular or Tissue Based Product Details Patient Name: Date of Service: [name], [name] 12/24/2021 7:45 A M Medical Record Number: [id number] Patient Account Number: [id number] Date of Birth/Sex: Treating RN: 1970-08-31 [name] Primary Care Provider: Garret [name] Other Clinician: Referring Provider: Treating Provider/Extender: [name] in Treatment: 24 Cellular or Tissue Based Product Type Wound #2 Right,Lateral,Plantar Foot Applied to: Performed By: Physician [name] Maudlin, MD Cellular or Tissue Based Product Type: Kerecis Omega3 Level of Consciousness (Pre-procedure): Awake and Alert Pre-procedure Verification/Time Out Yes - 07:57 Taken: Location: genitalia / hands / feet / multiple digits Wound Size (sq cm): 0.3 Product Size (sq cm): 21 Waste Size (sq cm): 1 Waste Reason: wound size Amount of Product Applied (sq cm): 20 Instrument Used: Forceps, Scissors Lot #: [id number] Expiration Date: 01/21/2024 Fenestrated: No Reconstituted: Yes Solution Type: normal saline Solution Amount: 6 ml Lot #: [id number] Solution  Expiration Date: 05/19/2024 Secured: Yes Secured With: Steri-Strips Dressing Applied: Yes Primary Dressing: adaptic Procedural Pain: 0 Post Procedural Pain: 0 Response to Treatment: Procedure was tolerated well Level of Consciousness (Post- Awake and [address] [name], [name] ([id number]) [id number] Page 2 of 10 procedure): Post Procedure Diagnosis Same as Pre-procedure Notes scribed for Dr. Celine [name] by [name] Peals, RN Electronic Signature(s) Signed: 01/14/2022 1:00:21 PM By: [name] Pilling RN, BSN Signed: 01/27/2022 2:25:03 PM By: [name] Maudlin MD FACS Previous Signature: 12/24/2021 8:19:55 AM Version By: [name] Maudlin MD FACS Previous Signature: 12/24/2021 4:17:50 PM Version By: [name] Entered By: [name] on 01/14/2022 13:00:21 -------------------------------------------------------------------------------- Debridement Details Patient Name: Date of Service: [place], [name] 12/24/2021 7:45 A M Medical Record Number: [id number] Patient Account Number: [id number] Date of Birth/Sex: Treating RN: 1970/11/01 [name] Primary Care Provider: Garret [name] Other Clinician: Referring Provider: Treating Provider/Extender: [name] in Treatment: 24 Debridement Performed for Assessment: Wound #2 Right,Lateral,Plantar Foot Performed By: Physician [name] Maudlin, MD Debridement Type: Debridement Severity of Tissue Pre Debridement: Fat layer exposed Level of Consciousness (Pre-procedure): Awake and Alert Pre-procedure Verification/Time Out Yes - 07:53 Taken: Start Time: 07:53 Pain Control: Lidocaine 4% T opical Solution T Area Debrided (L x W): otal 0.2 (cm) x 1.5 (cm) = 0.3 (cm) Tissue and other material debrided: Non-Viable, Callus, Slough, Skin: Epidermis, Slough Level: Skin/Epidermis Debridement Description: Selective/Open Wound Instrument: Curette Bleeding:  Minimum Hemostasis Achieved: Pressure Response to Treatment: Procedure was tolerated well Level of Consciousness (Post- Awake and Alert procedure): Post Debridement Measurements of Total Wound Length: (cm) 0.2 Width: (cm) 1.5 Depth: (cm) 0.9 Volume: (cm) 0.212 Character of Wound/Ulcer Post Debridement: Improved Severity of Tissue Post Debridement: Fat layer exposed Post Procedure Diagnosis Same as Pre-procedure Notes scribed for Dr. Celine [name] by [name] Peals, RN Electronic Signature(s) Signed: 12/24/2021 8:19:55 AM By: [name] Maudlin MD FACS Signed: 12/24/2021 4:17:50 PM By: [name] Entered By: [name] on 12/24/2021 07:57:08 [name],  [name] ([id number]) [id number] Page 3 of 10 -------------------------------------------------------------------------------- HPI Details Patient Name: Date of Service: [name], [name] 12/24/2021 7:45 A M Medical Record Number: [id number] Patient Account Number: [id number] Date of Birth/Sex: Treating RN: 11-17-1970 (51 y.o. 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 The most recent amputation was in August 2022. At that operation, antibiotic beads were placed in the wound. He has been managed by podiatry for his procedures and management of his wounds. He has been in a Water engineer. He is on oral doxycycline. They have been using Betadine wet to dry dressings along with Iodosorb. The patient states that when he thinks the wound is getting too dry, he applies topical Neosporin. At his last visit, on June 7 of this year, the  podiatrist determined that he felt the wound was stalled and referred him to wound care for additional evaluation and management. An MRI has been ordered, but not yet scheduled or performed. Pathology from his operation in August 2022 demonstrated findings consistent with chronic osteomyelitis. Today, there is a large irregular wound on the plantar surface of his right foot, at about the level of the fifth metatarsal head. This tracks through to a pinpoint opening on the dorsal lateral portion of his foot. The intake nurse reported purulent drainage. There is some malodor from the wound. No frank necrosis identified. 07/11/2021: Today, the wounds do not connect. I attempted multiple times from various directions and the shared tunnel is no longer open. He has some slough accumulation on the dorsal part of his foot as well as slough and callus buildup on the plantar surface. His MRI is scheduled for June 29. No purulent drainage or malodor appreciated today. 07/19/2021: The lateral foot wound has closed and there is no tunnel connecting the plantar foot wound to that site. The plantar foot wound still probes quite deeply, however. There is some slough, eschar, and nonviable tissue accumulated in the wound bed. No malodorous drainage present. His MRI was performed yesterday and is consistent with fairly extensive osteomyelitis. 07/29/2021: The patient has an appointment with infectious disease on July 18 to treat his osteomyelitis. The plantar wound still probes quite deeply, approaching bone. The orifice has narrowed quite substantially, however, making it more difficult for him to pack. 08/05/2021: The tunnel connecting the lateral foot wound to the plantar foot wound has reopened. He sees infectious disease tomorrow to discuss long-term antibiotic treatment for osteomyelitis. There was a bit of murky drainage in the wound, but this was noted after he had had topical lidocaine applied so may have just  been a blob of the anesthetic. No odor or frank purulent drainage. The wound probes deeply at the midfoot approaching bone. He does have MRI results confirming his diagnosis of osteomyelitis. He has failed to progress with conventional treatment and I think his best chance for preservation of the foot is to initiate hyperbaric oxygen therapy. 08/13/2021: The tunnel connecting the 2 wounds has closed again. He has a PICC line and is getting IV daptomycin and cefepime. EKG and chest x-ray are within normal limits. He is scheduled to start hyperbaric oxygen therapy tomorrow. The wound in his midfoot probes deeply, approaching bone.  The skin at the orifice continues to heaped up and threatens to close over despite the large cavity within. No erythema, induration, or purulent drainage. The wound on his lateral foot is fairly small and quite clean. 08/19/2021: The lateral foot wound has nearly closed. The wound in his midfoot does not probe quite as deeply today. The skin at the orifice continues to try to roll in and obscure the opening. No frankly necrotic tissue appreciated. He is tolerating hyperbaric oxygen therapy. 08/26/2021: The lateral foot wound has closed completely. The wound in his midfoot is shallower again today. The wound orifice is contracting. We are using gentamicin and silver alginate. He continues on IV daptomycin and cefepime and is tolerating hyperbaric oxygen therapy without difficulty. 09/02/2021: His foot is a little bit sore today but he was up walking on it all weekend doing back to school shopping with his daughter. The tunneling is down to about 3 cm and I do not appreciate bone at the tip of the probe. The wound is clean but has some callus creating an overhanging lip at the distal aspect. He continues to receive hyperbaric oxygen therapy as well as IV daptomycin and cefepime. 09/09/2021: His wound continues to improve. The tunnel has come in by 0.9 cm. He continues to form callus  around the orifice of the wound. He is tolerating hyperbaric oxygen therapy along with his IV antibiotics. 09/16/2021: The tunneling is down to just half a centimeter. He continues to build up callus around the orifice of the wound. He completed his course of IV antibiotics and his PICC line is scheduled to be removed this Friday. He is tolerating hyperbaric oxygen without difficulty. We have been applying topical gentamicin and silver alginate to his wound. 09/24/2021: The wound depth has come in even further. There is a little bit of callus buildup around the orifice. The opening is quite narrow, at this [name] 09/30/2021: The wound depth is about the same this week. The opening continues to contract with callus accumulation. There is some discoloration of the skin on the lateral part of his foot and he admitted to walking more than usual this weekend and also wearing a different pair shoes. He continues to tolerate hyperbaric oxygen therapy without difficulty. 10/07/2021: The wound depth has contracted a bit and is now approximately 1 to 1.5 cm. The orifice of the wound continues to try and close in over the space. There is just some callus and skin around the margins obscuring the orifice. 10/14/2021: The wound depth has come in a little bit more. The orifice of the wound is rolling inwards with epithelium and callus, making it difficult to pack the wound. 10/21/2021: There is still 1 portion of the wound, at the lateral aspect, that is still a couple of centimeters deep. The architecture of the wound makes this somewhat challenging to access and pack. Everything else looks clean. He has his usual accumulation of callus and skin, narrowing the orifice. 10/29/2021: No significant change to the depth of the wound. The orifice continues to contract with callus and skin narrowing the opening. His wife has been packing the wound and doing an excellent job. [name], [place] ([id number])  [id number] Page 4 of 10 11/05/2021: The depth of the wound has come in by about a centimeter. There is less callus accumulation around the orificedespite this, the wound is narrower today. We are still awaiting insurance approval for Kerecis powder. 11/12/2021: The depth of the wound continues to contract. He has accumulated  some callus around the orifice, as usual. We have received a verbal confirmation that he is approved for Kerecis, but no formal paperwork has yet been received. 11/19/2021: The depth continues to come in. There is callus accumulation around the orifice, as usual. He has been formally approved for Kerecis, but unfortunately we do not have a billing mechanism for the powder in [employer] yet. We are working to address this so we can begin treatment. 11/25/2021: Continued filling in of the depth of the wound. Continued buildup of callus around the orifice. No concern for infection. As we do not have a way to bill for Kerecis powder, but are capable of billing for the [facility] sheet, we are going to use that on him instead. 12/03/2021: The depth of the wound continues to fill in. As usual, he has accumulated callus and skin around the orifice of the wound. No concern for infection. He will complete his hyperbaric oxygen therapy this week. 12/17/2021: The wound depth continues to contract. There is callus and skin accumulation around the orifice of the wound, as per usual. There is more epithelium on the actual wound surface. Unfortunately, his Leroy Kennedy was not ordered. 12/24/2021: The wound depth has come in a little bit more this week. There is less skin accumulation around the orifice, but still some callus. On questioning, he admits to spending a fair amount of time on his feet. Electronic Signature(s) Signed: 12/24/2021 8:04:38 AM By: [name] Maudlin MD FACS Entered By: [name] on 12/24/2021  08:04:38 -------------------------------------------------------------------------------- Physical Exam Details Patient Name: Date of Service: [name], [name] 12/24/2021 7:45 A M Medical Record Number: [id number] Patient Account Number: [id number] Date of Birth/Sex: Treating RN: 09/08/1970 (51 y.o. M) Primary Care Provider: Garret [name] Other Clinician: Referring Provider: Treating Provider/Extender: [name] in Treatment: 24 Constitutional He is hypertensive, but asymptomatic.. . . . No acute distress. Respiratory Normal work of breathing on room air. Notes 12/24/2021: The wound depth has come in a little bit more this week. There is less skin accumulation around the orifice, but still some callus. Electronic Signature(s) Signed: 12/24/2021 8:05:15 AM By: [name] Maudlin MD FACS Entered By: [name] on 12/24/2021 08:05:15 -------------------------------------------------------------------------------- Physician Orders Details Patient Name: Date of Service: [name], [name] 12/24/2021 7:45 A M Medical Record Number: [id number] Patient Account Number: [id number] Date of Birth/Sex: Treating RN: January 15, 1971 [name] Primary Care Provider: Garret [name] Other Clinician: Referring Provider: Treating Provider/Extender: [name] in Treatment: 24 Verbal / Phone Orders: No Diagnosis Coding ICD-10 [address] 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 - Dr [name] Room 2 Return A Anesthetic Wound #2 Right,Lateral,Plantar Foot (In clinic) Topical Lidocaine 4% applied to wound  bed Cellular or Tissue Based Products Cellular or Tissue Based Product Type: - Kerecis #3 Cellular or Tissue Based Product applied to wound bed, secured with steri-strips, cover with Adaptic or Mepitel. (DO NOT REMOVE). Bathing/ Shower/ Hygiene May shower with protection but do not get wound dressing(s) wet. Off-Loading Other: - Try to not stand on your feet too much Wound Treatment Wound #2 - Foot Wound Laterality: Plantar, Right, Lateral Cleanser: Soap and Water Every Other Day/15 Days Discharge Instructions: May shower and wash wound with  dial antibacterial soap and water prior to dressing change. Cleanser: Wound Cleanser (Generic) Every Other Day/15 Days Discharge Instructions: Cleanse the wound with wound cleanser prior to applying a clean dressing using gauze sponges, not tissue or cotton balls. Peri-Wound Care: cotton tipped applicators (Generic) Every Other Day/15 Days Prim Dressing: kerecis Every Other Day/15 Days ary Discharge Instructions: do not remove Secondary Dressing: ADAPTIC TOUCH 3x4.25 in Every Other Day/15 Days Discharge Instructions: Apply over primary dressing as directed. Secondary Dressing: Woven Gauze Sponge, Non-Sterile 4x4 in Every Other Day/15 Days Discharge Instructions: Apply over primary dressing as directed. Secured With: Paper Tape, 2x10 (in/yd) (Generic) Every Other Day/15 Days Discharge Instructions: Secure dressing with tape as directed. Compression Wrap: Kerlix Roll 4.5x3.1 (in/yd) (Generic) Every Other Day/15 Days Discharge Instructions: Apply Kerlix and Coban compression as directed. Patient Medications llergies: No Known Allergies A Notifications Medication Indication Start End 12/24/2021 lidocaine DOSE topical 4 % cream - cream topical Electronic Signature(s) Signed: 12/24/2021 8:19:55 AM By: [name] Maudlin MD FACS Entered By: [name] on 12/24/2021  08:05:34 -------------------------------------------------------------------------------- Problem List Details Patient Name: Date of Service: [name], [name] 12/24/2021 7:45 A M Medical Record Number: [id number] Patient Account Number: [id number] Date of Birth/Sex: Treating RN: 1970/06/08 (51 y.o. 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 12/24/2021 7:45 A M Medical Record Number: [id number] Patient Account Number: [id number] Date of Birth/Sex: Treating RN: November 23, 1970 (51 y.o. 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 The most recent amputation was in August  2022. At that operation, antibiotic beads were placed in the wound. He has been managed by podiatry for his procedures and management of his wounds. He has been in a Water engineer. He is on oral doxycycline. They have been using Betadine wet to dry dressings along with Iodosorb. The patient states that when he thinks the wound is getting too dry, he applies topical Neosporin. At his last visit, on June 7 of this year, the podiatrist determined that he felt the wound was stalled and referred him to wound care for additional evaluation and management. An MRI has been ordered, but not yet scheduled or performed. Pathology from his operation in August 2022 demonstrated findings consistent with chronic osteomyelitis. Today, there is a large irregular wound on the plantar surface of his right foot, at about the level of the fifth metatarsal head. This tracks through to a pinpoint opening on the dorsal lateral portion of his foot. The intake nurse reported purulent drainage. There  is some malodor from the wound. No frank necrosis identified. 07/11/2021: Today, the wounds do not connect. I attempted multiple times from various directions and the shared tunnel is no longer open. He has some slough accumulation on the dorsal part of his foot as well as slough and callus buildup on the plantar surface. His MRI is scheduled for June 29. No purulent drainage or malodor appreciated today. 07/19/2021: The lateral foot wound has closed and there is no tunnel connecting the plantar foot wound to that site. The plantar foot wound still probes quite deeply, however. There is some slough, eschar, and nonviable tissue accumulated in the wound bed. No malodorous drainage present. His MRI was performed yesterday and is consistent with fairly extensive osteomyelitis. 07/29/2021: The patient has an appointment with infectious disease on July 18 to treat his osteomyelitis. The plantar wound still probes quite  deeply, approaching bone. The orifice has narrowed quite substantially, however, making it more difficult for him to pack. [name], Keylon [place] ([id number]) [id number] Page 7 of 10 08/05/2021: The tunnel connecting the lateral foot wound to the plantar foot wound has reopened. He sees infectious disease tomorrow to discuss long-term antibiotic treatment for osteomyelitis. There was a bit of murky drainage in the wound, but this was noted after he had had topical lidocaine applied so may have just been a blob of the anesthetic. No odor or frank purulent drainage. The wound probes deeply at the midfoot approaching bone. He does have MRI results confirming his diagnosis of osteomyelitis. He has failed to progress with conventional treatment and I think his best chance for preservation of the foot is to initiate hyperbaric oxygen therapy. 08/13/2021: The tunnel connecting the 2 wounds has closed again. He has a PICC line and is getting IV daptomycin and cefepime. EKG and chest x-ray are within normal limits. He is scheduled to start hyperbaric oxygen therapy tomorrow. The wound in his midfoot probes deeply, approaching bone. The skin at the orifice continues to heaped up and threatens to close over despite the large cavity within. No erythema, induration, or purulent drainage. The wound on his lateral foot is fairly small and quite clean. 08/19/2021: The lateral foot wound has nearly closed. The wound in his midfoot does not probe quite as deeply today. The skin at the orifice continues to try to roll in and obscure the opening. No frankly necrotic tissue appreciated. He is tolerating hyperbaric oxygen therapy. 08/26/2021: The lateral foot wound has closed completely. The wound in his midfoot is shallower again today. The wound orifice is contracting. We are using gentamicin and silver alginate. He continues on IV daptomycin and cefepime and is tolerating hyperbaric oxygen therapy  without difficulty. 09/02/2021: His foot is a little bit sore today but he was up walking on it all weekend doing back to school shopping with his daughter. The tunneling is down to about 3 cm and I do not appreciate bone at the tip of the probe. The wound is clean but has some callus creating an overhanging lip at the distal aspect. He continues to receive hyperbaric oxygen therapy as well as IV daptomycin and cefepime. 09/09/2021: His wound continues to improve. The tunnel has come in by 0.9 cm. He continues to form callus around the orifice of the wound. He is tolerating hyperbaric oxygen therapy along with his IV antibiotics. 09/16/2021: The tunneling is down to just half a centimeter. He continues to build up callus around the orifice of the wound. He completed his course of  IV antibiotics and his PICC line is scheduled to be removed this Friday. He is tolerating hyperbaric oxygen without difficulty. We have been applying topical gentamicin and silver alginate to his wound. 09/24/2021: The wound depth has come in even further. There is a little bit of callus buildup around the orifice. The opening is quite narrow, at this [name] 09/30/2021: The wound depth is about the same this week. The opening continues to contract with callus accumulation. There is some discoloration of the skin on the lateral part of his foot and he admitted to walking more than usual this weekend and also wearing a different pair shoes. He continues to tolerate hyperbaric oxygen therapy without difficulty. 10/07/2021: The wound depth has contracted a bit and is now approximately 1 to 1.5 cm. The orifice of the wound continues to try and close in over the space. There is just some callus and skin around the margins obscuring the orifice. 10/14/2021: The wound depth has come in a little bit more. The orifice of the wound is rolling inwards with epithelium and callus, making it difficult to pack the wound. 10/21/2021: There is still  1 portion of the wound, at the lateral aspect, that is still a couple of centimeters deep. The architecture of the wound makes this somewhat challenging to access and pack. Everything else looks clean. He has his usual accumulation of callus and skin, narrowing the orifice. 10/29/2021: No significant change to the depth of the wound. The orifice continues to contract with callus and skin narrowing the opening. His wife has been packing the wound and doing an excellent job. 11/05/2021: The depth of the wound has come in by about a centimeter. There is less callus accumulation around the orificeoodespite this, the wound is narrower today. We are still awaiting insurance approval for Kerecis powder. 11/12/2021: The depth of the wound continues to contract. He has accumulated some callus around the orifice, as usual. We have received a verbal confirmation that he is approved for Kerecis, but no formal paperwork has yet been received. 11/19/2021: The depth continues to come in. There is callus accumulation around the orifice, as usual. He has been formally approved for Kerecis, but unfortunately we do not have a billing mechanism for the powder in [facility] yet. We are working to address this so we can begin treatment. 11/25/2021: Continued filling in of the depth of the wound. Continued buildup of callus around the orifice. No concern for infection. As we do not have a way to bill for Kerecis powder, but are capable of billing for the [facility] sheet, we are going to use that on him instead. 12/03/2021: The depth of the wound continues to fill in. As usual, he has accumulated callus and skin around the orifice of the wound. No concern for infection. He will complete his hyperbaric oxygen therapy this week. 12/17/2021: The wound depth continues to contract. There is callus and skin accumulation around the orifice of the wound, as per usual. There is more epithelium on the actual wound surface. Unfortunately, his  Leroy Kennedy was not ordered. 12/24/2021: The wound depth has come in a little bit more this week. There is less skin accumulation around the orifice, but still some callus. On questioning, he admits to spending a fair amount of time on his feet. Patient History Information obtained from Patient. Social History Never smoker, Marital Status - Married, Alcohol Use - Never, Drug Use - No History, Caffeine Use - Daily - T coffee. ea; Medical History Endocrine Patient  has history of Type II Diabetes Hospitalization/Surgery History - Amuptation of 3rd,4th and 5th toes of Right foot;Oral Surgery;Anal Fissure surgery; Cholecystectomy. [address] [name], Ell M ([id number]) [id number] Page 8 of 10 Constitutional He is hypertensive, but asymptomatic.Marland Kitchen No acute distress. Vitals Time Taken: 7:46 AM, Height: 72 in, Weight: 312 lbs, BMI: 42.3, Temperature: 99.2 F, Pulse: 82 bpm, Respiratory Rate: 18 breaths/min, Blood Pressure: 159/91 mmHg, Capillary Blood Glucose: 164 mg/dl. Respiratory Normal work of breathing on room air. General Notes: 12/24/2021: The wound depth has come in a little bit more this week. There is less skin accumulation around the orifice, but still some callus. Integumentary (Hair, Skin) Wound #2 status is Open. Original cause of wound was Gradually Appeared. The date acquired was: 12/19/2016. The wound has been in treatment 24 weeks. The wound is located on the [place]. The wound measures 0.2cm length x 1.5cm width x 0.9cm depth; 0.236cm^2 area and 0.212cm^3 volume. There is Fat Layer (Subcutaneous Tissue) exposed. There is no tunneling or undermining noted. There is a medium amount of serosanguineous drainage noted. The wound margin is thickened. There is large (67-100%) red granulation within the wound bed. There is a small (1-33%) amount of necrotic tissue within the wound bed including Adherent Slough. The periwound skin appearance had no  abnormalities noted for moisture. The periwound skin appearance had no abnormalities noted for color. The periwound skin appearance exhibited: Callus. Periwound temperature was noted as No Abnormality. 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 There was a Selective/Open Wound Skin/Epidermis Debridement with a total area of 0.3 sq cm performed by [name] Maudlin, MD. With the following instrument(s): Curette to remove Non-Viable tissue/material. Material removed includes Callus, Slough, and Skin: Epidermis after achieving pain control using Lidocaine 4% Topical Solution. No specimens were taken. A time out was conducted at 07:53, prior to the start of the procedure. A Minimum amount of bleeding was controlled with Pressure. The procedure was tolerated well. Post Debridement Measurements: 0.2cm length x 1.5cm width x 0.9cm depth; 0.212cm^3 volume. Character of Wound/Ulcer Post Debridement is improved. Severity of Tissue Post Debridement is: Fat layer exposed. Post procedure Diagnosis Wound #2: Same as Pre-Procedure General Notes: scribed for Dr. Celine [name] by [name] Peals, RN. Pre-procedure diagnosis of Wound #2 is a Diabetic Wound/Ulcer of the Lower Extremity located on the Right,Lateral,Plantar Foot. A skin graft procedure using a bioengineered skin substitute/cellular or tissue based product was performed by [name] Maudlin, MD with the following instrument(s): Forceps and Scissors. Kerecis Omega3 was applied and secured with Steri-Strips. 3 sq cm of product was utilized and 1 sq cm was wasted due to  wound size. Post Application, adaptic was applied. A Time Out was conducted at 07:57, prior to the start of the procedure. The procedure was tolerated well with a pain level of 0 throughout and a pain level of 0 following the procedure. Post procedure Diagnosis Wound #2: Same as Pre-Procedure General Notes: scribed for Dr. Celine [name] by [name] Peals, RN. Plan Follow-up Appointments: Return Appointment in 1 week. - Dr [name] Room 2 Anesthetic: Wound #2 Right,Lateral,Plantar Foot: (In clinic) Topical Lidocaine 4% applied to wound bed Cellular or Tissue Based Products: Cellular or Tissue Based  Product Type: - Kerecis #3 Cellular or Tissue Based Product applied to wound bed, secured with steri-strips, cover with Adaptic or Mepitel. (DO NOT REMOVE). Bathing/ Shower/ Hygiene: May shower with protection but do not get wound dressing(s) wet. Off-Loading: Other: - Try to not stand on your feet too much The following medication(s) was prescribed: lidocaine topical 4 % cream cream topical was prescribed at facility WOUND #2: - Foot Wound Laterality: Plantar, Right, Lateral Cleanser: Soap and Water Every Other Day/15 Days Discharge Instructions: May shower and wash wound with dial antibacterial soap and water prior to dressing change. Cleanser: Wound Cleanser (Generic) Every Other Day/15 Days Discharge Instructions: Cleanse the wound with wound cleanser prior to applying a clean dressing using gauze sponges, not tissue or cotton balls. Peri-Wound Care: cotton tipped applicators (Generic) Every Other Day/15 Days Prim Dressing: kerecis Every Other Day/15 Days ary Discharge Instructions: do not remove [name], Krishan M ([id number]) [id number] Page 9 of 10 Secondary Dressing: ADAPTIC TOUCH 3x4.25 in Every Other Day/15 Days Discharge Instructions: Apply over primary dressing as directed. Secondary Dressing: Woven Gauze Sponge, Non-Sterile 4x4 in Every Other Day/15  Days Discharge Instructions: Apply over primary dressing as directed. Secured With: Paper Tape, 2x10 (in/yd) (Generic) Every Other Day/15 Days Discharge Instructions: Secure dressing with tape as directed. Compression Wrap: Kerlix Roll 4.5x3.1 (in/yd) (Generic) Every Other Day/15 Days Discharge Instructions: Apply Kerlix and Coban compression as directed. 12/24/2021: The wound depth has come in a little bit more this week. There is less skin accumulation around the orifice, but still some callus. I used a curette to debride skin, callus. and slough from the wound. I then prepared the Kerecis skin substitute by moistening it with saline and cutting an appropriate sized piece. It was packed into the wound, as we still are unable to bill for the powder, which frankly I think would be more effective. The skin substitute was secured in place with Adaptic and Steri-Strips. A gauze sponge was used as a bolster and the foot was wrapped. I also think a contributing factor to his slow wound healing is the fact that he is spending a fair amount of time on his feet. I reemphasized the necessity of offloading to him and told him that we might need to put him in a total contact cast if he could not offload of his own accord. He will follow-up in 1 week. Electronic Signature(s) Signed: 01/27/2022 2:25:03 PM By: [name] Maudlin MD FACS Signed: 01/27/2022 5:04:52 PM By: [name] Catholic RN Previous Signature: 12/24/2021 8:07:52 AM Version By: [name] Maudlin MD FACS Entered By: [name] on 01/09/2022 12:03:03 -------------------------------------------------------------------------------- HxROS Details Patient Name: Date of Service: [place], [name] 12/24/2021 7:45 A M Medical Record Number: [id number] Patient Account Number: [id number] Date of Birth/Sex: Treating RN: August 27, 1970 (51 y.o. M) Primary Care Provider: Garret [name] Other Clinician: Referring Provider: Treating Provider/Extender: [name] in Treatment: 24 Information Obtained From Patient Endocrine Medical History: Positive for: Type II Diabetes Immunizations Pneumococcal Vaccine: Received Pneumococcal Vaccination: Yes Received Pneumococcal Vaccination On or After 60th Birthday: No Implantable Devices None Hospitalization / Surgery History Type of Hospitalization/Surgery Amuptation of 3rd,4th and 5th toes of Right foot;Oral Surgery;Anal Fissure surgery; Cholecystectomy Family and Social History Never smoker; Marital Status - Married; Alcohol Use: Never; Drug Use: No History; Caffeine Use: Daily - T coffee; Financial Concerns: No; Food, ea; Clothing or Shelter Needs: No; Support System Lacking: No; Transportation Concerns: No Electronic Signature(s) Signed: 12/24/2021 8:19:55 AM By: [name] Maudlin  MD FACS Entered By: [name] on 12/24/2021 08:04:51 [name], Joesph M ([id number]) [id number] Page 10 of 10 -------------------------------------------------------------------------------- SuperBill Details Patient Name: Date of Service: [name], [name] 12/24/2021 Medical Record Number: [id number] Patient Account Number: [id number] Date of Birth/Sex: Treating RN: 1970-07-07 (51 y.o. M) Primary Care Provider: Garret [name] Other Clinician: Referring Provider: Treating Provider/Extender: [name] in Treatment: 24 Diagnosis Coding ICD-10 Codes Code Description E11.49 Type 2 diabetes mellitus with other diabetic neurological complication W40.973 Other chronic osteomyelitis, right ankle and foot L97.514 Non-pressure chronic ulcer of other part of right foot with necrosis of bone Facility Procedures : CPT4 Code: 53299242 Description: 68341 - SKIN SUB GRAFT FACE/NK/HF/G ICD-10 Diagnosis Description E11.49 Type 2 diabetes mellitus with other diabetic neurological complication D62.229 Non-pressure chronic ulcer of other part  of right foot with necrosis of bone Modifier: Quantity: 1 : CPT4 Code: [id number] Description: E1740 Kerecis Omega Marigen 3 x 7 qty 21 ICD-10 Diagnosis Description E11.49 Type 2 diabetes mellitus with other diabetic neurological complication C14.481 Non-pressure chronic ulcer of other part of right foot with necrosis of bone Modifier: Quantity: 21 Physician Procedures : CPT4 Code Description Modifier [id number] - WC PHYS LEVEL 3 - EST PT 25 ICD-10 Diagnosis Description L97.514 Non-pressure chronic ulcer of other part of right foot with necrosis of bone M86.671 Other chronic osteomyelitis, right ankle and foot  E11.49 Type 2 diabetes mellitus with other diabetic neurological complication Quantity: 1 : [id number] - WC PHYS SKIN SUB GRAFT FACE/NK/HF/G ICD-10 Diagnosis Description E11.49 Type 2 diabetes mellitus with other diabetic neurological complication O87.867 Non-pressure chronic ulcer of other part of right foot with necrosis of bone Quantity: 1 Electronic Signature(s) Signed: 01/14/2022 1:01:58 PM By: [name] Pilling RN, BSN Signed: 01/27/2022 2:25:03 PM By: [name] Maudlin MD FACS Previous Signature: 12/24/2021 8:08:10 AM Version By: [name] Maudlin MD FACS Entered By: [name] on 01/14/2022 13:01:58

## 2021-12-26 MED ORDER — AMOXICILLIN-POT CLAVULANATE 875-125 MG PO TABS: 1.0000 | ORAL_TABLET | Freq: Two times a day (BID) | ORAL | 0 refills | Status: DC

## 2021-12-26 NOTE — Addendum Note (Signed)
Addended by: [name] on: 12/26/2021 10:31 AM   Modules accepted: Orders

## 2021-12-26 NOTE — Progress Notes (Signed)
Received call from patient's wife that pharmacy dispensed only 4 tablets on 12/25/21 of Augmentin. Confirmed with Dr. Juleen China to send Rx for total 30 supply with no additional refills. Rx sent to pharmacy.   [name], LPN

## 2021-12-31 ENCOUNTER — Encounter (HOSPITAL_BASED_OUTPATIENT_CLINIC_OR_DEPARTMENT_OTHER): Payer: BLUE CROSS/BLUE SHIELD | Admitting: Internal Medicine

## 2021-12-31 DIAGNOSIS — E11621 Type 2 diabetes mellitus with foot ulcer: Secondary | ICD-10-CM | POA: Diagnosis not present

## 2021-12-31 NOTE — Progress Notes (Addendum)
[name], [name] ([id number]) [id number] Page 1 of 8 Visit Report for 12/31/2021 Cellular or Tissue Based Product Details Patient Name: Date of Service: [name], [name] 12/31/2021 8:15 [name] Patient Account Number: [id number] Date of Birth/Sex: Treating RN: 1970-09-08 [name] Primary Care Provider: [name] Other Clinician: Referring Provider: Treating Provider/Extender: Earney Navy in Treatment: 25 Cellular or Tissue Based Product Type Wound #2 Right,Lateral,Plantar Foot Applied to: Performed By: Physician Ricard Dillon., MD Cellular or Tissue Based Product Type: Kerecis Omega3 Level of Consciousness (Pre-procedure): Awake and Alert Pre-procedure Verification/Time Out Yes - 08:30 Taken: Location: genitalia / hands / feet / multiple digits Wound Size (sq cm): 0.07 Product Size (sq cm): 21 Waste Size (sq cm): 1 Waste Reason: wound size Amount of Product Applied (sq cm): 20 Instrument Used: Forceps, Scissors Lot #: [id number] Expiration Date: 06/26/2023 Fenestrated: No Reconstituted: Yes Solution Type: normal saline Solution Amount: 5 ml Lot #: [id number] Solution Expiration Date: 06/19/2024 Secured: Yes Secured With: Steri-Strips Dressing Applied: Yes Primary Dressing: adaptic Procedural Pain: 0 Post Procedural Pain: 0 Response to Treatment: Procedure was tolerated well Level of Consciousness (Post- Awake and Alert procedure): Post Procedure Diagnosis Same as Pre-procedure Notes scribed for Dr. Dellia Nims by Adline Peals, RN Electronic Signature(s) Signed: 01/14/2022 12:56:07 PM By: Deon Pilling RN, BSN Signed: 03/10/2022 3:52:16 PM By: Linton Ham MD Previous Signature: 12/31/2021 3:46:52 PM Version By: Linton Ham MD Entered By: Deon Pilling on 01/14/2022  12:56:07 -------------------------------------------------------------------------------- HPI Details Patient Name: Date of Service: [place], [name] 12/31/2021 8:15 [name] Patient Account Number: [id number] Date of Birth/Sex: Treating RN: [birth year] (51 y.o. 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 The most recent amputation was in August 2022. At that operation, antibiotic beads were placed in the wound. He has been managed by podiatry for his procedures and management of his wounds. He has been in a Water engineer. He is on oral doxycycline. They have been using Betadine wet to dry dressings along with Iodosorb. The patient states that when he thinks the wound is getting too dry, he applies topical Neosporin. At his last visit, on June 7 of this year, the podiatrist determined that he felt the wound was stalled and referred him to wound care for additional evaluation and management. An MRI has been ordered, but not yet scheduled or performed. Pathology from his operation in August 2022 demonstrated findings consistent with chronic osteomyelitis. Today, there is a large irregular wound on the plantar surface of his right foot, at about the level of the fifth metatarsal head. This tracks through to a pinpoint opening on the dorsal lateral portion of his foot. The intake nurse reported purulent drainage. There is some malodor from the wound. No frank necrosis identified. 07/11/2021: Today, the wounds do not connect. I  attempted multiple times from various directions and the shared tunnel is no longer open. He has some slough accumulation on the dorsal part of his foot as well as slough and callus buildup on the plantar surface. His MRI is scheduled for June 29.  No purulent drainage or malodor appreciated today. 07/19/2021: The lateral foot wound has closed and there is no tunnel connecting the plantar foot wound to that site. The plantar foot wound still probes quite deeply, however. There is some slough, eschar, and nonviable tissue accumulated in the wound bed. No malodorous drainage present. His MRI was performed yesterday and is consistent with fairly extensive osteomyelitis. 07/29/2021: The patient has an appointment with infectious disease on July 18 to treat his osteomyelitis. The plantar wound still probes quite deeply, approaching bone. The orifice has narrowed quite substantially, however, making it more difficult for him to pack. 08/05/2021: The tunnel connecting the lateral foot wound to the plantar foot wound has reopened. He sees infectious disease tomorrow to discuss long-term antibiotic treatment for osteomyelitis. There was a bit of murky drainage in the wound, but this was noted after he had had topical lidocaine applied so may have just been a blob of the anesthetic. No odor or frank purulent drainage. The wound probes deeply at the midfoot approaching bone. He does have MRI results confirming his diagnosis of osteomyelitis. He has failed to progress with conventional treatment and I think his best chance for preservation of the foot is to initiate hyperbaric oxygen therapy. 08/13/2021: The tunnel connecting the 2 wounds has closed again. He has a PICC line and is getting IV daptomycin and cefepime. EKG and chest x-ray are within normal limits. He is scheduled to start hyperbaric oxygen therapy tomorrow. The wound in his midfoot probes deeply, approaching bone. The skin at the orifice continues to  heaped up and threatens to close over despite the large cavity within. No erythema, induration, or purulent drainage. The wound on his lateral foot is fairly small and quite clean. 08/19/2021: The lateral foot wound has nearly closed. The wound in his midfoot does not probe quite as deeply today. The skin at the orifice continues to try to roll in and obscure the opening. No frankly necrotic tissue appreciated. He is tolerating hyperbaric oxygen therapy. 08/26/2021: The lateral foot wound has closed completely. The wound in his midfoot is shallower again today. The wound orifice is contracting. We are using gentamicin and silver alginate. He continues on IV daptomycin and cefepime and is tolerating hyperbaric oxygen therapy without difficulty. 09/02/2021: His foot is a little bit sore today but he was up walking on it all weekend doing back to school shopping with his daughter. The tunneling is down to about 3 cm and I do not appreciate bone at the tip of the probe. The wound is clean but has some callus creating an overhanging lip at the distal aspect. He continues to receive hyperbaric oxygen therapy as well as IV daptomycin and cefepime. 09/09/2021: His wound continues to improve. The tunnel has come in by 0.9 cm. He continues to form callus around the orifice of the wound. He is tolerating hyperbaric oxygen therapy along with his IV antibiotics. 09/16/2021: The tunneling is down to just half a centimeter. He continues to build up callus around the orifice of the wound. He completed his course of IV antibiotics and his PICC line is scheduled to be removed this Friday. He is tolerating hyperbaric oxygen without difficulty. We have been applying topical gentamicin and silver alginate to his wound. 09/24/2021: The wound depth has come in even further. There is a little bit of callus buildup around the orifice. The opening is quite narrow, at this point. 09/30/2021: The wound depth is about the same this week.  The  opening continues to contract with callus accumulation. There is some discoloration of the skin on the lateral part of his foot and he admitted to walking more than usual this weekend and also wearing a different pair shoes. He continues to tolerate hyperbaric oxygen therapy without difficulty. 10/07/2021: The wound depth has contracted a bit and is now approximately 1 to 1.5 cm. The orifice of the wound continues to try and close in over the space. There is just some callus and skin around the margins obscuring the orifice. 10/14/2021: The wound depth has come in a little bit more. The orifice of the wound is rolling inwards with epithelium and callus, making it difficult to pack the wound. 10/21/2021: There is still 1 portion of the wound, at the lateral aspect, that is still a couple of centimeters deep. The architecture of the wound makes this somewhat challenging to access and pack. Everything else looks clean. He has his usual accumulation of callus and skin, narrowing the orifice. 10/29/2021: No significant change to the depth of the wound. The orifice continues to contract with callus and skin narrowing the opening. His wife has been packing the wound and doing an excellent job. 11/05/2021: The depth of the wound has come in by about a centimeter. There is less callus accumulation around the orificedespite this, the wound is narrower today. We are still awaiting insurance approval for Kerecis powder. 11/12/2021: The depth of the wound continues to contract. He has accumulated some callus around the orifice, as usual. We have received a verbal confirmation that he is approved for Kerecis, but no formal paperwork has yet been received. 11/19/2021: The depth continues to come in. There is callus accumulation around the orifice, as usual. He has been formally approved for Kerecis, but unfortunately we do not have a billing mechanism for the powder in [facility] yet. We are working to address this so  we can begin treatment. 11/25/2021: Continued filling in of the depth of the wound. Continued buildup of callus around the orifice. No concern for infection. As we do not have a way to bill for Kerecis powder, but are capable of billing for the [facility] sheet, we are going to use that on him instead. 12/03/2021: The depth of the wound continues to fill in. As usual, he has accumulated callus and skin around the orifice of the wound. No concern for infection. [name], [name] ([id number]) [id number] Page 3 of 8 He will complete his hyperbaric oxygen therapy this week. 12/17/2021: The wound depth continues to contract. There is callus and skin accumulation around the orifice of the wound, as per usual. There is more epithelium on the actual wound surface. Unfortunately, his Leroy Kennedy was not ordered. 12/24/2021: The wound depth has come in a little bit more this week. There is less skin accumulation around the orifice, but still some callus. On questioning, he admits to spending a fair amount of time on his feet. 12/12; this is a very difficult wound to assess size slitlike wound with some depth but minimal opening. I applied Kerecis No. 4 Electronic Signature(s) Signed: 12/31/2021 3:46:52 PM By: Linton Ham MD Entered By: Linton Ham on 12/31/2021 08:42:52 -------------------------------------------------------------------------------- Physical Exam Details Patient Name: Date of Service: [name], [name] 12/31/2021 8:15 [name] Medical Record Number: [id number] Patient Account Number: [id number] Date of Birth/Sex: Treating RN: 21-Aug-1970 (51 y.o. [name] Primary Care Provider: [name] Other Clinician: Referring Provider: Treating Provider/Extender: Earney Navy in Treatment: 25 Constitutional Patient is hypertensive.Marland Kitchen  Pulse regular and within target range for patient.Marland Kitchen Respirations regular, non-labored and within target range..  Temperature is normal and within the target range for the patient.Marland Kitchen Appears in no distress. Notes Wound exam; difficult to really assess the depth but there is no palpable bone. What I can see the granulation looks healthy. 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 There is no evidence of infection. His peripheral pulses are palpable Electronic Signature(s) Signed: 12/31/2021 3:46:52 PM By: Linton Ham MD Entered By: Linton Ham on 12/31/2021 08:44:08 -------------------------------------------------------------------------------- Physician Orders Details Patient Name: Date of Service: [name], [name] 12/31/2021 8:15 [name] Patient Account Number: [id number] Date of Birth/Sex: Treating RN: 10-21-1970 [name] Primary Care Provider: [name] Other Clinician: Referring Provider: Treating Provider/Extender: Earney Navy in Treatment: 25 Verbal / Phone Orders: No Diagnosis Coding Follow-up Appointments ppointment in 1 week. - Dr Celine Ahr Room 2 Return A Anesthetic Wound #2 Right,Lateral,Plantar Foot (In clinic) Topical Lidocaine 4% applied to wound bed Cellular or Tissue Based Products Cellular or Tissue Based Product Type: - Kerecis #4 Cellular or Tissue Based Product applied to wound bed, secured with steri-strips, cover with Adaptic or Mepitel. (DO NOT REMOVE). [address] [name], [name] ([id number]) [id number] Page 4 of 8 May shower with protection but do not get wound dressing(s) wet. Off-Loading Other: - Try to not stand on your feet too much Wound Treatment Wound #2 - Foot Wound Laterality: Plantar, Right, Lateral Cleanser: Soap and Water Every Other Day/15 Days Discharge Instructions: May shower and wash wound with dial antibacterial soap and water prior to dressing change. Cleanser: Wound Cleanser (Generic)  Every Other Day/15 Days Discharge Instructions: Cleanse the wound with wound cleanser prior to applying a clean dressing using gauze sponges, not tissue or cotton balls. Peri-Wound Care: cotton tipped applicators (Generic) Every Other Day/15 Days Prim Dressing: kerecis Every Other Day/15 Days ary Discharge Instructions: do not remove Secondary Dressing: ADAPTIC TOUCH 3x4.25 in Every Other Day/15 Days Discharge Instructions: Apply over primary dressing as directed. Secondary Dressing: Woven Gauze Sponge, Non-Sterile 4x4 in Every Other Day/15 Days Discharge Instructions: Apply over primary dressing as directed. Secured With: Paper Tape, 2x10 (in/yd) (Generic) Every Other Day/15 Days Discharge Instructions: Secure dressing with tape as directed. Compression Wrap: Kerlix Roll 4.5x3.1 (in/yd) (Generic) Every Other Day/15 Days Discharge Instructions: Apply Kerlix and Coban compression as directed. Patient Medications llergies: No Known Allergies A Notifications Medication Indication Start End 12/31/2021 lidocaine DOSE topical 4 % cream - cream topical Electronic Signature(s) Signed: 12/31/2021 3:46:52 PM By: Linton Ham MD Signed: 12/31/2021 3:54:33 PM By: Adline Peals Entered By: Adline Peals on 12/31/2021 08:29:05 -------------------------------------------------------------------------------- Problem List Details Patient Name: Date of Service: [name] [name] 12/31/2021 8:15 [name] Patient Account Number: [id number] Date of Birth/Sex: Treating RN: 08/29/1970 (51 y.o. 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 12/31/2021 8:15 [name] Patient Account Number: [id number] Date of Birth/Sex: Treating RN: Jun 09, 1970 (51 y.o. 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 The most recent amputation was in August 2022. At that operation, antibiotic beads were placed in the wound. He has been managed by podiatry for his procedures and management of his wounds. He has been in a Water engineer. He is on oral doxycycline. They have been using Betadine wet to dry dressings along with Iodosorb. The patient states that when he thinks the wound is getting too dry, he applies topical Neosporin. At his last visit, on June 7 of this year, the podiatrist determined that he felt the wound was stalled and referred him to wound care for additional evaluation and management. An MRI has been ordered, but not yet scheduled or performed. Pathology from his operation in August 2022 demonstrated findings consistent with chronic osteomyelitis. Today, there is a large irregular wound on the plantar surface of  his right foot, at about the level of the fifth metatarsal head. This tracks through to a pinpoint opening on the dorsal lateral portion of his foot. The intake nurse reported purulent drainage. There is some malodor from the wound. No frank necrosis identified. 07/11/2021: Today, the wounds do not connect. I attempted multiple times from various directions and the shared tunnel is no longer open. He has some slough accumulation on the dorsal part of his foot as well as slough and callus buildup on the plantar surface. His MRI is scheduled for June 29. No purulent drainage or malodor appreciated today. 07/19/2021: The lateral foot wound has closed and there is no tunnel connecting the plantar foot wound to that site. The plantar foot wound still probes quite deeply, however. There is some slough, eschar, and nonviable tissue accumulated in the wound bed. No malodorous drainage present. His MRI was performed yesterday and is consistent with fairly extensive osteomyelitis. 07/29/2021: The patient has an appointment with infectious disease on July 18 to treat his osteomyelitis. The plantar wound still probes quite deeply, approaching bone. The orifice has narrowed quite substantially, however, making it more difficult for him to pack. 08/05/2021: The tunnel connecting the lateral foot wound to the plantar foot wound has reopened. He sees infectious disease tomorrow to discuss long-term antibiotic treatment for osteomyelitis. There was a bit of murky drainage in the wound, but this was noted after he had had topical lidocaine applied so may have just been a blob of the anesthetic. No odor or frank purulent drainage. The wound probes deeply at the midfoot approaching bone. He does have MRI results confirming his diagnosis of osteomyelitis. He has failed to progress with conventional treatment and I think his best chance for preservation of the foot is to initiate hyperbaric oxygen therapy. 08/13/2021: The  tunnel connecting the 2 wounds has closed again. He has a PICC line and is getting IV daptomycin and cefepime. EKG and chest x-ray are within normal limits. He is scheduled to start hyperbaric oxygen therapy tomorrow. The wound in his midfoot probes deeply, approaching bone. The skin at the orifice  continues to heaped up and threatens to close over despite the large cavity within. No erythema, induration, or purulent drainage. The wound on his lateral foot is fairly small and quite clean. 08/19/2021: The lateral foot wound has nearly closed. The wound in his midfoot does not probe quite as deeply today. The skin at the orifice continues to try to roll in and obscure the opening. No frankly necrotic tissue appreciated. He is tolerating hyperbaric oxygen therapy. 08/26/2021: The lateral foot wound has closed completely. The wound in his midfoot is shallower again today. The wound orifice is contracting. We are using gentamicin and silver alginate. He continues on IV daptomycin and cefepime and is tolerating hyperbaric oxygen therapy without difficulty. 09/02/2021: His foot is a little bit sore today but he was up walking on it all weekend doing back to school shopping with his daughter. The tunneling is down to about 3 cm and I do not appreciate bone at the tip of the probe. The wound is clean but has some callus creating an overhanging lip at the distal aspect. He continues to receive hyperbaric oxygen therapy as well as IV daptomycin and cefepime. 09/09/2021: His wound continues to improve. The tunnel has come in by 0.9 cm. He continues to form callus around the orifice of the wound. He is tolerating hyperbaric oxygen therapy along with his IV antibiotics. 09/16/2021: The tunneling is down to just half a centimeter. He continues to build up callus around the orifice of the wound. He completed his course of IV [name], [name] ([id number]) [id number] Page 6 of 8 antibiotics and his  PICC line is scheduled to be removed this Friday. He is tolerating hyperbaric oxygen without difficulty. We have been applying topical gentamicin and silver alginate to his wound. 09/24/2021: The wound depth has come in even further. There is a little bit of callus buildup around the orifice. The opening is quite narrow, at this point. 09/30/2021: The wound depth is about the same this week. The opening continues to contract with callus accumulation. There is some discoloration of the skin on the lateral part of his foot and he admitted to walking more than usual this weekend and also wearing a different pair shoes. He continues to tolerate hyperbaric oxygen therapy without difficulty. 10/07/2021: The wound depth has contracted a bit and is now approximately 1 to 1.5 cm. The orifice of the wound continues to try and close in over the space. There is just some callus and skin around the margins obscuring the orifice. 10/14/2021: The wound depth has come in a little bit more. The orifice of the wound is rolling inwards with epithelium and callus, making it difficult to pack the wound. 10/21/2021: There is still 1 portion of the wound, at the lateral aspect, that is still a couple of centimeters deep. The architecture of the wound makes this somewhat challenging to access and pack. Everything else looks clean. He has his usual accumulation of callus and skin, narrowing the orifice. 10/29/2021: No significant change to the depth of the wound. The orifice continues to contract with callus and skin narrowing the opening. His wife has been packing the wound and doing an excellent job. 11/05/2021: The depth of the wound has come in by about a centimeter. There is less callus accumulation around the orificeoodespite this, the wound is narrower today. We are still awaiting insurance approval for Kerecis powder. 11/12/2021: The depth of the wound continues to contract. He has accumulated some callus around the  orifice,  as usual. We have received a verbal confirmation that he is approved for Kerecis, but no formal paperwork has yet been received. 11/19/2021: The depth continues to come in. There is callus accumulation around the orifice, as usual. He has been formally approved for Kerecis, but unfortunately we do not have a billing mechanism for the powder in [facility] yet. We are working to address this so we can begin treatment. 11/25/2021: Continued filling in of the depth of the wound. Continued buildup of callus around the orifice. No concern for infection. As we do not have a way to bill for Kerecis powder, but are capable of billing for the [facility] sheet, we are going to use that on him instead. 12/03/2021: The depth of the wound continues to fill in. As usual, he has accumulated callus and skin around the orifice of the wound. No concern for infection. He will complete his hyperbaric oxygen therapy this week. 12/17/2021: The wound depth continues to contract. There is callus and skin accumulation around the orifice of the wound, as per usual. There is more epithelium on the actual wound surface. Unfortunately, his Leroy Kennedy was not ordered. 12/24/2021: The wound depth has come in a little bit more this week. There is less skin accumulation around the orifice, but still some callus. On questioning, he admits to spending a fair amount of time on his feet. 12/12; this is a very difficult wound to assess size slitlike wound with some depth but minimal opening. I applied Kerecis No. 4 Objective Constitutional Patient is hypertensive.. Pulse regular and within target range for patient.Marland Kitchen Respirations regular, non-labored and within target range.. Temperature is normal and within the target range for the patient.Marland Kitchen Appears in no distress. Vitals Time Taken: 8:05 AM, Height: 72 in, Weight: 312 lbs, BMI: 42.3, Temperature: 98.4 F, Pulse: 76 bpm, Respiratory Rate: 20 breaths/min, Blood Pressure: 152/92 mmHg,  Capillary Blood Glucose: 139 mg/dl. General Notes: Wound exam; difficult to really assess the depth but there is no palpable bone. What I can see the granulation looks healthy. 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 There is no evidence of infection. His peripheral pulses are palpable Integumentary (Hair, Skin) Wound #2 status is Open. Original cause of wound was Gradually Appeared. The date acquired was: 12/19/2016. The wound has been in treatment 25 weeks. The wound is located on the [place]. The wound measures 0.1cm length x 0.7cm width x 1.1cm depth; 0.055cm^2 area and 0.06cm^3 volume. There is Fat Layer (Subcutaneous Tissue) exposed. There is no tunneling or undermining noted. There is a medium amount of serosanguineous drainage noted. The wound margin is thickened. There is large (67-100%) red granulation within the wound bed. There is a small (1-33%) amount of necrotic tissue within the wound bed including Adherent Slough. The periwound skin appearance had no abnormalities noted for moisture. The periwound skin appearance had no abnormalities noted for color. The periwound skin appearance exhibited: Callus. Periwound temperature was noted as No Abnormality. 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 A skin graft procedure using a bioengineered skin substitute/cellular or tissue based product was performed by Ricard Dillon., MD with the following instrument(s): Forceps  and Scissors. Kerecis Omega3 was applied and secured with  Steri-Strips. 3 sq cm of product was utilized and 1 sq cm was wasted due to wound size. Post Application, adaptic was applied. A Time Out was conducted at 08:30, prior to the start of the procedure. The procedure was tolerated well with a pain level of 0 throughout and a pain level of 0 following the procedure. Post procedure Diagnosis Wound #2: Same as Pre-Procedure General Notes: scribed for Dr. Dellia Nims by Adline Peals, RN. Plan Follow-up Appointments: Return Appointment in 1 week. - Dr Celine Ahr Room 2 Anesthetic: Wound #2 Right,Lateral,Plantar Foot: (In clinic) Topical Lidocaine 4% applied to wound bed Cellular or Tissue Based Products: Cellular or Tissue Based Product Type: - Kerecis #4 Cellular or Tissue Based Product applied to wound bed, secured with steri-strips, cover with Adaptic or Mepitel. (DO NOT REMOVE). Bathing/ Shower/ Hygiene: May shower with protection but do not get wound dressing(s) wet. Off-Loading: Other: - Try to not stand on your feet too much The following medication(s) was prescribed: lidocaine topical 4 % cream cream topical was prescribed at facility WOUND #2: - Foot Wound Laterality: Plantar, Right, Lateral Cleanser: Soap and Water Every Other Day/15 Days Discharge Instructions: May shower and wash wound with dial antibacterial soap and water prior to dressing change. Cleanser: Wound Cleanser (Generic) Every Other Day/15 Days Discharge Instructions: Cleanse the wound with wound cleanser prior to applying a clean dressing using gauze sponges, not tissue or cotton balls. Peri-Wound Care: cotton tipped applicators (Generic) Every Other Day/15 Days Prim Dressing: kerecis Every Other Day/15 Days ary Discharge Instructions: do not remove Secondary Dressing: ADAPTIC TOUCH 3x4.25 in Every Other Day/15 Days Discharge Instructions: Apply over primary dressing as directed. Secondary Dressing: Woven Gauze Sponge, Non-Sterile 4x4 in Every Other Day/15 Days Discharge  Instructions: Apply over primary dressing as directed. Secured With: Paper T ape, 2x10 (in/yd) (Generic) Every Other Day/15 Days Discharge Instructions: Secure dressing with tape as directed. Com pression Wrap: Kerlix Roll 4.5x3.1 (in/yd) (Generic) Every Other Day/15 Days Discharge Instructions: Apply Kerlix and Coban compression as directed. 1. In discussion with our intake nurse it appears that the depth of this has come in. There is no palpable bone and no current evidence of infection. 2. He has completed his antibiotics for underlying osteomyelitis. 3. Kerecis No. 4 applied today in the standard fashion 4. I talked to him about offloading this wound. 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 Ideally if he can work in a total contact cast that would be great although this is on his right foot. Otherwise I have asked him to layer the bottom of his boots with insoles and cut out the area around where the wound is perhaps this would keep the wound off the bottom of his work boot. Electronic Signature(s) Signed: 01/27/2022 5:04:52 PM By: Dellie Catholic RN Signed: 03/10/2022 3:52:16 PM By: Linton Ham MD Previous Signature: 12/31/2021 3:46:52 PM Version By: Linton Ham MD Entered By: Dellie Catholic on 01/09/2022 13:31:55 -------------------------------------------------------------------------------- SuperBill Details Patient Name: Date of Service: [name], [name] 12/31/2021 Medical Record Number: [id number] Patient Account Number: [id number] Date of Birth/Sex: Treating RN: June 30, 1970 (51 y.o. [name] Primary Care Provider: [name] Other Clinician: Referring Provider: Treating Provider/Extender: Earney Navy in Treatment: 25 Diagnosis Coding [name], [name] ([id number]) [id number] Page 8 of 8 ICD-10 Codes Code Description E11.49 Type 2 diabetes mellitus with other diabetic  neurological complication 123456 Other chronic  osteomyelitis, right ankle and foot L97.514 Non-pressure chronic ulcer of other part of right foot with necrosis of bone Facility Procedures : CPT4 Code: [id number] Description: [id number] - SKIN SUB GRAFT FACE/NK/HF/G ICD-10 Diagnosis Description E11.49 Type 2 diabetes mellitus with other diabetic neurological complication [id number] Non-pressure chronic ulcer of other part of right foot with necrosis of bone Modifier: Quantity: 1 : CPT4 Code: [id number] Description: [id number] Kerecis Omega Marigen 3 x 7 qty 21 ICD-10 Diagnosis Description E11.49 Type 2 diabetes mellitus with other diabetic neurological complication [id number] Non-pressure chronic ulcer of other part of right foot with necrosis of bone Modifier: Quantity: 21 Physician Procedures : CPT4 Code Description Modifier [id number] - WC PHYS SKIN SUB GRAFT FACE/NK/HF/G ICD-10 Diagnosis Description E11.49 Type 2 diabetes mellitus with other diabetic neurological complication [id number] Non-pressure chronic ulcer of other part of right foot  with necrosis of bone Quantity: 1 Electronic Signature(s) Signed: 01/14/2022 12:57:16 PM By: Deon Pilling RN, BSN Signed: 03/10/2022 3:52:16 PM By: Linton Ham MD Previous Signature: 12/31/2021 3:46:52 PM Version By: Linton Ham MD Entered By: Deon Pilling on 01/14/2022 12:57:15

## 2021-12-31 NOTE — Progress Notes (Signed)
[name], [name] ([id number]) [id number] Page 1 of 7 Visit Report for 12/31/2021 Arrival Information Details Patient Name: Date of Service: [name], [place] [name] 12/31/2021 8:15 [name] Medical Record Number: [id number] Patient Account Number: [id number] Date of Birth/Sex: Treating RN: [birth year] (51 y.o. [name] Primary Care Linwood Gullikson: [name] Other Clinician: Referring Kieara Schwark: Treating Wessley Emert/Extender: Earney Navy in Treatment: 25 Visit Information History Since Last Visit All ordered tests and consults were completed: No Patient Arrived: Ambulatory Added or deleted any medications: No Arrival Time: 08:08 Any new allergies or adverse reactions: No Accompanied By: self Had a fall or experienced change in No Transfer Assistance: None activities of daily living that may affect Patient Identification Verified: Yes risk of falls: Secondary Verification Process Completed: Yes Signs or symptoms of abuse/neglect since last visito No Patient Requires Transmission-Based Precautions: No Hospitalized since last visit: No Patient Has Alerts: Yes Implantable device outside of the clinic excluding No cellular tissue based products placed in the center since last visit: Pain Present Now: No Electronic Signature(s) Signed: 12/31/2021 8:57:00 AM By: [name] Entered By: [name] on 12/31/2021 08:08:44 -------------------------------------------------------------------------------- Encounter Discharge Information Details Patient Name: Date of Service: [name], [name] 12/31/2021 8:15 [name] Medical Record Number: [id number] Patient Account Number: [id number] Date of Birth/Sex: Treating RN: [birth year] [name] Primary Care Jodell Weitman: [name] Other Clinician: Referring Aseel Truxillo: Treating Branon Sabine/Extender: Earney Navy in Treatment: 25 Encounter Discharge Information Items Post  Procedure Vitals Discharge Condition: Stable Temperature (F): 98.4 Ambulatory Status: Ambulatory Pulse (bpm): 76 Discharge Destination: Home Respiratory Rate (breaths/min): 20 Transportation: Private Auto Blood Pressure (mmHg): 152/92 Accompanied By: self Schedule Follow-up Appointment: Yes Clinical Summary of Care: Patient Declined Electronic Signature(s) Signed: 12/31/2021 3:54:33 PM By: Adline Peals Entered By: Adline Peals on 12/31/2021 08:32:44 [name], [name] ([id number]) [id number] Page 2 of 7 -------------------------------------------------------------------------------- Lower Extremity Assessment Details Patient Name: Date of Service: [name], [name] 12/31/2021 8:15 [name] Medical Record Number: [id number] Patient Account Number: [id number] Date of Birth/Sex: Treating RN: 1970-01-23 [name] Primary Care [name]: [name] Other Clinician: Referring Mar Zettler: Treating Annastasia Haskins/Extender: Earney Navy in Treatment: 25 Edema Assessment Assessed: [name]: No] [name]: No] Edema: [Left: N] [Right: o] Calf Left: Right: Point of Measurement: From Medial Instep 39 cm Ankle Left: Right: Point of Measurement: From Medial Instep 23 cm Electronic Signature(s) Signed: 12/31/2021 3:54:33 PM By: Adline Peals Entered By: Adline Peals on 12/31/2021 08:21:07 -------------------------------------------------------------------------------- Multi Wound Chart Details Patient Name: Date of Service: [name], [name] 12/31/2021 8:15 [name] Medical Record Number: [id number] Patient Account Number: [id number] Date of Birth/Sex: Treating RN: 1970/11/04 (51 y.o. [name] Primary Care [name]: [name] Other Clinician: Referring Daniel Ritthaler: Treating Malania Gawthrop/Extender: Earney Navy in Treatment: 25 Vital Signs Height(in): 72 Capillary Blood Glucose(mg/dl):  139 Weight(lbs): 312 Pulse(bpm): 76 Body Mass Index(BMI): 42.3 Blood Pressure(mmHg): 152/92 Temperature(F): 98.4 Respiratory Rate(breaths/min): 20 [2:Photos:] [N/A:N/A] Right, Lateral, Plantar Foot N/A N/A Wound Location: Gradually Appeared N/A N/A Wounding Event: Diabetic Wound/Ulcer of the Lower N/A N/A Primary Etiology: Extremity Type II Diabetes N/A N/A Comorbid History: 12/19/2016 N/A N/A Date Acquired: [name], [name] ([id number]) [id number] Page 3 of 7 25 N/A N/A Weeks of Treatment: Open N/A N/A Wound Status: No N/A N/A Wound Recurrence: 0.1x0.7x1.1 N/A N/A Measurements L x W x D (cm) 0.055 N/A N/A A (cm) : rea 0.06 N/A N/A Volume (cm) : 98.50% N/A N/A %  Reduction in A rea: 99.70% N/A N/A % Reduction in Volume: Grade 3 N/A N/A Classification: Medium N/A N/A Exudate A mount: Serosanguineous N/A N/A Exudate Type: red, brown N/A N/A Exudate Color: Thickened N/A N/A Wound Margin: Large (67-100%) N/A N/A Granulation A mount: Red N/A N/A Granulation Quality: Small (1-33%) N/A N/A Necrotic A mount: Fat Layer (Subcutaneous Tissue): Yes N/A N/A Exposed Structures: Fascia: No Tendon: No Muscle: No Joint: No Bone: No Small (1-33%) N/A N/A Epithelialization: Callus: Yes N/A N/A Periwound Skin Texture: No Abnormalities Noted N/A N/A Periwound Skin Moisture: No Abnormalities Noted N/A N/A Periwound Skin Color: No Abnormality N/A N/A Temperature: Cellular or Tissue Based Product N/A N/A Procedures Performed: Treatment Notes Wound #2 (Foot) Wound Laterality: Plantar, Right, Lateral Cleanser Soap and Water Discharge Instruction: May shower and wash wound with dial antibacterial soap and water prior to dressing change. Wound Cleanser Discharge Instruction: Cleanse the wound with wound cleanser prior to applying a clean dressing using gauze sponges, not tissue or cotton balls. Peri-Wound Care cotton tipped  applicators Topical Primary Dressing kerecis Discharge Instruction: do not remove Secondary Dressing ADAPTIC TOUCH 3x4.25 in Discharge Instruction: Apply over primary dressing as directed. Woven Gauze Sponge, Non-Sterile 4x4 in Discharge Instruction: Apply over primary dressing as directed. Secured With Paper Tape, 2x10 (in/yd) Discharge Instruction: Secure dressing with tape as directed. Compression Wrap Kerlix Roll 4.5x3.1 (in/yd) Discharge Instruction: Apply Kerlix and Coban compression as directed. Compression Stockings Add-Ons Electronic Signature(s) Signed: 12/31/2021 3:46:52 PM By: Linton Ham MD Entered By: Linton Ham on 12/31/2021 08:41:45 [name], [name] ([id number]) [id number] Page 4 of 7 -------------------------------------------------------------------------------- Multi-Disciplinary Care Plan Details Patient Name: Date of Service: [name], [name] 12/31/2021 8:15 [name] Medical Record Number: [id number] Patient Account Number: [id number] Date of Birth/Sex: Treating RN: 03/06/1970 [name] Primary Care Willim Turnage: [name] Other Clinician: Referring Damariz Paganelli: Treating Tage Feggins/Extender: Earney Navy in Treatment: [place] reviewed with physician Active Inactive Nutrition Nursing Diagnoses: Impaired glucose control: actual or potential Potential for alteratiion in Nutrition/Potential for imbalanced nutrition Goals: Patient/caregiver will maintain therapeutic glucose control Date Initiated: 07/29/2021 Target Resolution Date: 02/21/2022 Goal Status: Active Interventions: Assess patient nutrition upon admission and as needed per policy Provide education on elevated blood sugars and impact on wound healing Treatment Activities: Patient referred to Primary Care Physician for further nutritional evaluation : 07/29/2021 Notes: Osteomyelitis Nursing  Diagnoses: Infection: osteomyelitis Knowledge deficit related to disease process and management Goals: Patient/caregiver will verbalize understanding of disease process and disease management Date Initiated: 07/29/2021 Date Inactivated: 11/12/2021 Target Resolution Date: 11/19/2021 Goal Status: Met Patient's osteomyelitis will resolve Date Initiated: 07/29/2021 Target Resolution Date: 02/21/2022 Goal Status: Active Interventions: Assess for signs and symptoms of osteomyelitis resolution every visit Provide education on osteomyelitis Treatment Activities: Consult for HBO : 07/29/2021 Notes: Electronic Signature(s) Signed: 12/31/2021 3:54:33 PM By: Adline Peals Entered By: Adline Peals on 12/31/2021 08:30:08 [name], [name] ([id number]) [id number] Page 5 of 7 -------------------------------------------------------------------------------- Pain Assessment Details Patient Name: Date of Service: [name], [name] 12/31/2021 8:15 [name] Medical Record Number: [id number] Patient Account Number: [id number] Date of Birth/Sex: Treating RN: 04-03-1970 (51 y.o. [name] Primary Care Reynold Mantell: [name] Other Clinician: Referring Edithe Dobbin: Treating Gearldine Looney/Extender: Earney Navy in Treatment: 25 Active Problems Location of Pain Severity and Description of Pain Patient Has Paino No Site Locations Pain Management and Medication Current Pain Management: Electronic Signature(s) Signed: 12/31/2021 8:57:00 AM By: [name] Entered By: [name]  on 12/31/2021 08:09:19 -------------------------------------------------------------------------------- Patient/Caregiver Education Details Patient Name: Date of Service: [place], [place] [name] 12/12/2023andnbsp8:15 [name] Medical Record Number: [id number] Patient Account Number: [id number] Date of Birth/Gender: Treating RN: 19-Jul-1970 [name] Primary Care Physician:  [name] Other Clinician: Referring Physician: Treating Physician/Extender: Earney Navy in Treatment: 25 Education Assessment Education Provided To: Patient Education Topics Provided Wound/Skin Impairment: Methods: Explain/Verbal Responses: Reinforcements needed, State content correctly [employer]) Signed: 12/31/2021 3:54:33 PM By: Adline Peals Entered By: Adline Peals on 12/31/2021 08:30:19 [name], Adhvik [name] ([id number]) [id number] Page 6 of 7 -------------------------------------------------------------------------------- Wound Assessment Details Patient Name: Date of Service: [name], [name] 12/31/2021 8:15 [name] Medical Record Number: [id number] Patient Account Number: [id number] Date of Birth/Sex: Treating RN: July 22, 1970 [name] Primary Care [name]: [name] Other Clinician: Referring [name]: Treating Emmalene Kattner/Extender: Earney Navy in Treatment: 25 Wound Status Wound Number: 2 Primary Etiology: Diabetic Wound/Ulcer of the Lower Extremity Wound Location: Right, Lateral, Plantar Foot Wound Status: Open Wounding Event: Gradually Appeared Comorbid History: Type II Diabetes Date Acquired: 12/19/2016 Weeks Of Treatment: 25 Clustered Wound: No Photos Wound Measurements Length: (cm) Width: (cm) Depth: (cm) Area: (cm) Volume: (cm) 0.1 % Reduction in Area: 98.5% 0.7 % Reduction in Volume: 99.7% 1.1 Epithelialization: Small (1-33%) 0.055 Tunneling: No 0.06 Undermining: No Wound Description Classification: Grade 3 Wound Margin: Thickened Exudate Amount: Medium Exudate Type: Serosanguineous Exudate Color: red, brown Foul Odor After Cleansing: No Slough/Fibrino Yes Wound Bed Granulation Amount: Large (67-100%) Exposed Structure Granulation Quality: Red Fascia Exposed: No Necrotic Amount: Small (1-33%) Fat Layer  (Subcutaneous Tissue) Exposed: Yes Necrotic Quality: Adherent Slough Tendon Exposed: No Muscle Exposed: No Joint Exposed: No Bone Exposed: No Periwound Skin Texture Texture Color No Abnormalities Noted: No No Abnormalities Noted: Yes Callus: Yes Temperature / Pain Temperature: No Abnormality Moisture No Abnormalities Noted: Yes Treatment Notes Wound #2 (Foot) Wound Laterality: Plantar, Right, Lateral [name], [name] ([id number]) [id number] Page 7 of 7 Cleanser Soap and Water Discharge Instruction: May shower and wash wound with dial antibacterial soap and water prior to dressing change. Wound Cleanser Discharge Instruction: Cleanse the wound with wound cleanser prior to applying a clean dressing using gauze sponges, not tissue or cotton balls. Peri-Wound Care cotton tipped applicators Topical Primary Dressing kerecis Discharge Instruction: do not remove Secondary Dressing ADAPTIC TOUCH 3x4.25 in Discharge Instruction: Apply over primary dressing as directed. Woven Gauze Sponge, Non-Sterile 4x4 in Discharge Instruction: Apply over primary dressing as directed. Secured With Paper Tape, 2x10 (in/yd) Discharge Instruction: Secure dressing with tape as directed. Compression Wrap Kerlix Roll 4.5x3.1 (in/yd) Discharge Instruction: Apply Kerlix and Coban compression as directed. Compression Stockings Add-Ons Electronic Signature(s) Signed: 12/31/2021 3:54:33 PM By: Adline Peals Entered By: Adline Peals on 12/31/2021 08:22:37 -------------------------------------------------------------------------------- Vitals Details Patient Name: Date of Service: [name], [name] 12/31/2021 8:15 [name] Medical Record Number: [id number] Patient Account Number: [id number] Date of Birth/Sex: Treating RN: 19-Sep-1970 (51 y.o. [name] Primary Care Kolby Myung: [name] Other Clinician: Referring Brewster Wolters: Treating Bettylee Feig/Extender: Earney Navy in Treatment: 25 Vital Signs Time Taken: 08:05 Temperature (F): 98.4 Height (in): 72 Pulse (bpm): 76 Weight (lbs): 312 Respiratory Rate (breaths/min): 20 Body Mass Index (BMI): 42.3 Blood Pressure (mmHg): 152/92 Capillary Blood Glucose (mg/dl): 139 Reference Range: 80 - 120 mg / dl Electronic Signature(s) Signed: 12/31/2021 8:57:00 AM By: [name] Entered By: [name] on 12/31/2021 08:09:10

## 2022-01-06 ENCOUNTER — Encounter (HOSPITAL_BASED_OUTPATIENT_CLINIC_OR_DEPARTMENT_OTHER): Payer: BLUE CROSS/BLUE SHIELD | Admitting: Internal Medicine

## 2022-01-06 DIAGNOSIS — E11621 Type 2 diabetes mellitus with foot ulcer: Secondary | ICD-10-CM | POA: Diagnosis not present

## 2022-01-07 NOTE — Progress Notes (Signed)
[name], [name] ([id number]) [id number] Page 1 of 7 Visit Report for 01/06/2022 Arrival Information Details Patient Name: Date of Service: [place], [name] [name] Medical Record Number: [id number] Patient Account Number: [id number] Date of Birth/Sex: Treating [name]: [name] [name] Primary Care Ruby Logiudice: [name] [name]: Referring [name]: Treating [name]/Extender: [name] in Treatment: 26 Visit Information History Since Last Visit Added or deleted any medications: No Patient Arrived: Ambulatory Any new allergies or adverse reactions: No Arrival Time: 08:53 Had [name] fall or experienced change in No Accompanied [name]: self activities of daily living that [name] affect Transfer Assistance: None risk of falls: Patient Identification Verified: Yes Signs or symptoms of abuse/neglect since last visito No Patient Requires Transmission-Based Precautions: No Hospitalized since last visit: No Patient Has Alerts: Yes Implantable device outside of the clinic excluding No cellular tissue based products placed in the [name] since last visit: Has Dressing in Place as Prescribed: Yes Pain Present Now: No Electronic Signature(s) Signed: 01/06/2022 11:40:40 AM [name]: [name] Entered [name]: [name] Catholic on 01/06/2022 08:54:27 -------------------------------------------------------------------------------- Encounter Discharge Information Details Patient Name: Date of Service: [name], [name] 01/06/2022 8:45 [name] [name] Medical Record Number: [id number] Patient Account Number: [id number] Date of Birth/Sex: Treating [name]: [name] [name] Primary Care [name]: [name] [name]: Referring [name]: Treating Claiborne Stroble/Extender: [name] in Treatment: 26 Encounter Discharge Information Items Post Procedure Vitals Discharge  Condition: Stable Temperature (F): 98.3 Ambulatory Status: Ambulatory Pulse (bpm): 72 Discharge Destination: Home Respiratory Rate (breaths/min): 18 Transportation: Private Auto Blood Pressure (mmHg): 132/81 Accompanied [name]: self Schedule Follow-up Appointment: Yes Clinical Summary of Care: Patient Declined Electronic Signature(s) Signed: 01/06/2022 5:14:26 PM [name]: [name]: [name] on 01/06/2022 10:56:27 [name], [name] ([id number]) [id number] Page 2 of 7 -------------------------------------------------------------------------------- Lower Extremity Assessment Details Patient Name: Date of Service: [name], [name] 01/06/2022 8:45 [name] [name] Medical Record Number: [id number] Patient Account Number: [id number] Date of Birth/Sex: Treating [name]: [name] [name] Primary Care [name]: [name] [name]: Referring Shavette Shoaff: Treating [name]/Extender: [name] in Treatment: 26 Edema Assessment Assessed: [name]: No] [name]: No] Edema: [Left: N] [Right: o] Calf Left: Right: Point of Measurement: From Medial Instep 39 cm Ankle Left: Right: Point of Measurement: From Medial Instep 23 cm Vascular Assessment Pulses: Dorsalis Pedis Palpable: [Right:Yes] Electronic Signature(s) Signed: 01/06/2022 11:40:40 AM [name]: [name] Entered [name]: [name] Catholic on 01/06/2022 09:06:32 -------------------------------------------------------------------------------- Multi Wound Chart Details Patient Name: Date of Service: [name], [name] 01/06/2022 8:45 [name] [name] Medical Record Number: [id number] Patient Account Number: [id number] Date of Birth/Sex: Treating [name]: [name] (51 y.o. [name] Primary Care [name]: [name] [name]: Referring [name]: Treating [name]/Extender: [name] in Treatment: 26 Vital Signs Height(in):  72 Pulse(bpm): 72 Weight(lbs): 312 Blood Pressure(mmHg): 132/81 Body Mass Index(BMI): 42.3 Temperature(F): 98.3 Respiratory Rate(breaths/min): 18 [2:Photos:] [N/[name]:N/[name]] Right, Lateral, Plantar Foot N/[name] N/[name] Wound Location: Gradually Appeared N/[name] N/[name] Wounding Event: Diabetic Wound/Ulcer of the Lower N/[name] N/[name] Primary Etiology: Extremity Type II Diabetes N/[name] N/[name] Comorbid History: 12/19/2016 N/[name] N/[name] Date Acquired: 70 N/[name] N/[name] Weeks of Treatment: Open N/[name] N/[name] Wound Status: No N/[name] N/[name] Wound Recurrence: 0.4x0.6x1 N/[name] N/[name] Measurements L x W x D (cm) 0.188 N/[name] N/[name] [name] (cm) : rea 0.188 N/[name] N/[name] Volume (cm) : 94.80% N/[name] N/[name] % Reduction in [name] rea: 99.00% N/[name] N/[name] %  Reduction in Volume: 6 Starting Position 1 (o'clock): 7 Ending Position 1 (o'clock): 0.4 Maximum Distance 1 (cm): Yes N/[name] N/[name] Undermining: Grade 3 N/[name] N/[name] Classification: Medium N/[name] N/[name] Exudate [name] mount: Serosanguineous N/[name] N/[name] Exudate Type: red, brown N/[name] N/[name] Exudate Color: Thickened N/[name] N/[name] Wound Margin: Medium (34-66%) N/[name] N/[name] Granulation [name] mount: Pink N/[name] N/[name] Granulation Quality: Medium (34-66%) N/[name] N/[name] Necrotic [name] mount: Fat Layer (Subcutaneous Tissue): Yes N/[name] N/[name] Exposed Structures: Fascia: No Tendon: No Muscle: No Joint: No Bone: No Small (1-33%) N/[name] N/[name] Epithelialization: Debridement - Selective/Open Wound N/[name] N/[name] Debridement: Pre-procedure Verification/Time Out 09:10 N/[name] N/[name] Taken: Lidocaine 4% Topical Solution N/[name] N/[name] Pain Control: Slough N/[name] N/[name] Tissue Debrided: Non-Viable Tissue N/[name] N/[name] Level: 0.24 N/[name] N/[name] Debridement [name] (sq cm): rea Curette N/[name] N/[name] Instrument: Minimum N/[name] N/[name] Bleeding: Pressure N/[name] N/[name] Hemostasis [name] chieved: 0 N/[name] N/[name] Procedural Pain: 0 N/[name] N/[name] Post Procedural Pain: Procedure was tolerated well N/[name] N/[name] Debridement Treatment Response: 0.4x0.6x1 N/[name] N/[name] Post Debridement Measurements L x W x D (cm) 0.188 N/[name] N/[name] Post Debridement Volume: (cm) Callus:  Yes N/[name] N/[name] Periwound Skin Texture: No Abnormalities Noted N/[name] N/[name] Periwound Skin Moisture: No Abnormalities Noted N/[name] N/[name] Periwound Skin Color: No Abnormality N/[name] N/[name] Temperature: Debridement N/[name] N/[name] Procedures Performed: Treatment Notes Electronic Signature(s) Signed: 01/06/2022 5:06:55 PM [name]: Linton Ham MD Entered [name]: Linton Ham on 01/06/2022 09:53:51 -------------------------------------------------------------------------------- Multi-Disciplinary Care Plan Details Patient Name: Date of Service: [name], [name] 01/06/2022 8:45 [name] [name] Medical Record Number: [id number] Patient Account Number: [id number] Date of Birth/Sex: Treating [name]: [birth year] [name] Primary Care Christohper Dube: [name] [name]: Referring Kemp Gomes: Treating Liliauna Santoni/Extender: [name] in Treatment: [place] reviewed with physician [name], [name] ([id number]) [id number] Page 4 of 7 Active Inactive Nutrition Nursing Diagnoses: Impaired glucose control: actual or potential Potential for alteratiion in Nutrition/Potential for imbalanced nutrition Goals: Patient/caregiver will maintain therapeutic glucose control Date Initiated: 07/29/2021 Target Resolution Date: 02/21/2022 Goal Status: Active Interventions: Assess patient nutrition upon admission and as needed per policy Provide education on elevated blood sugars and impact on wound healing Treatment Activities: Patient referred to Primary Care Physician for further nutritional evaluation : 07/29/2021 Notes: Osteomyelitis Nursing Diagnoses: Infection: osteomyelitis Knowledge deficit related to disease process and management Goals: Patient/caregiver will verbalize understanding of disease process and disease management Date Initiated: 07/29/2021 Date Inactivated: 11/12/2021 Target Resolution Date: 11/19/2021 Goal Status:  Met Patient's osteomyelitis will resolve Date Initiated: 07/29/2021 Target Resolution Date: 02/21/2022 Goal Status: Active Interventions: Assess for signs and symptoms of osteomyelitis resolution every visit Provide education on osteomyelitis Treatment Activities: Consult for HBO : 07/29/2021 Notes: Electronic Signature(s) Signed: 01/06/2022 5:14:26 PM [name]: [name]: [name] on 01/06/2022 10:54:37 -------------------------------------------------------------------------------- Pain Assessment Details Patient Name: Date of Service: [name], [name] 01/06/2022 8:45 [name] [name] Medical Record Number: [id number] Patient Account Number: [id number] Date of Birth/Sex: Treating [name]: [birth year] [name] Primary Care Tilia Faso: [name] [name]: Referring Guilford Shannahan: Treating Dagmar Adcox/Extender: [name] in Treatment: 26 Active Problems Location of Pain Severity and Description of Pain Patient Has Paino No Site Locations [name], [name] ([id number]) [id number] Page 5 of 7 Pain Management and Medication Current Pain Management: Electronic Signature(s) Signed: 01/06/2022 11:40:40 AM [name]: [name] Entered [name]: [name] Catholic on 01/06/2022 09:06:25 -------------------------------------------------------------------------------- Patient/Caregiver Education Details Patient Name: Date of Service: [name], [name] 12/18/2023andnbsp8:45 [name] [name] Medical Record Number: [id number]  Patient Account Number: [id number] Date of Birth/Gender: Treating [name]: [name] [name] Primary Care Physician: [name] [name]: Referring Physician: Treating Physician/Extender: [name] in Treatment: 26 Education Assessment Education Provided To: Patient Education Topics Provided Wound/Skin Impairment: Methods:  Explain/Verbal Responses: Return demonstration correctly Electronic Signature(s) Signed: 01/06/2022 5:14:26 PM [name]: [name]: [name] on 01/06/2022 10:54:55 -------------------------------------------------------------------------------- Wound Assessment Details Patient Name: Date of Service: [name], [name] 01/06/2022 8:45 [name] [name] Medical Record Number: [id number] Patient Account Number: [id number] Date of Birth/Sex: Treating [name]: [name] [name] Primary Care Roshaunda Starkey: [name] [name]: Referring Samanatha Brammer: Treating Kenzley Ke/Extender: Robson, Michael Hunter, Stephen [name], [name] ([id number]) [id number] Page 6 of 7 Weeks in Treatment: 26 Wound Status Wound Number: 2 Primary Etiology: Diabetic Wound/Ulcer of the Lower Extremity Wound Location: Right, Lateral, Plantar Foot Wound Status: Open Wounding Event: Gradually Appeared Comorbid History: Type II Diabetes Date Acquired: 12/19/2016 Weeks Of Treatment: 26 Clustered Wound: No Photos Wound Measurements Length: (cm) 0.4 Width: (cm) 0.6 Depth: (cm) 1 Area: (cm) 0.188 Volume: (cm) 0.188 % Reduction in Area: 94.8% % Reduction in Volume: 99% Epithelialization: Small (1-33%) Undermining: Yes Starting Position (o'clock): 6 Ending Position (o'clock): 7 Maximum Distance: (cm) 0.4 Wound Description Classification: Grade 3 Wound Margin: Thickened Exudate Amount: Medium Exudate Type: Serosanguineous Exudate Color: red, brown Foul Odor After Cleansing: No Slough/Fibrino Yes Wound Bed Granulation Amount: Medium (34-66%) Exposed Structure Granulation Quality: Pink Fascia Exposed: No Necrotic Amount: Medium (34-66%) Fat Layer (Subcutaneous Tissue) Exposed: Yes Necrotic Quality: Adherent Slough Tendon Exposed: No Muscle Exposed: No Joint Exposed: No Bone Exposed: No Periwound Skin Texture Texture Color No Abnormalities Noted:  No No Abnormalities Noted: Yes Callus: Yes Temperature / Pain Temperature: No Abnormality Moisture No Abnormalities Noted: Yes Treatment Notes Wound #2 (Foot) Wound Laterality: Plantar, Right, Lateral Cleanser Soap and Water Discharge Instruction: [name] shower and wash wound with dial antibacterial soap and water prior to dressing change. Wound Cleanser Discharge Instruction: Cleanse the wound with wound cleanser prior to applying [name] clean dressing using gauze sponges, not tissue or cotton balls. Peri-Wound Care cotton tipped applicators Topical [address] [name], Donavyn [name] ([id number]) [id number] Page 7 of 7 kerecis Discharge Instruction: do not remove Secondary Dressing ADAPTIC TOUCH 3x4.25 in Discharge Instruction: Apply over primary dressing as directed. Woven Gauze Sponge, Non-Sterile 4x4 in Discharge Instruction: Apply over primary dressing as directed. Secured With Paper Tape, 2x10 (in/yd) Discharge Instruction: Secure dressing with tape as directed. Compression Wrap Kerlix Roll 4.5x3.1 (in/yd) Discharge Instruction: Apply Kerlix and Coban compression as directed. Compression Stockings Add-Ons Electronic Signature(s) Signed: 01/06/2022 11:40:40 AM [name]: [name] Entered [name]: [name] Catholic on 01/06/2022 09:05:15 -------------------------------------------------------------------------------- Vitals Details Patient Name: Date of Service: [name], [name] 01/06/2022 8:45 [name] [name] Medical Record Number: [id number] Patient Account Number: [id number] Date of Birth/Sex: Treating [name]: [name] [name] Primary Care Gunda Maqueda: [name] [name]: Referring Fraida Veldman: Treating Basha Krygier/Extender: [name] in Treatment: 26 Vital Signs Time Taken: 08:54 Temperature (F): 98.3 Height (in): 72 Pulse (bpm): 72 Weight (lbs): 312 Respiratory Rate (breaths/min): 18 Body Mass Index  (BMI): 42.3 Blood Pressure (mmHg): 132/81 Reference Range: 80 - 120 mg / dl Electronic Signature(s) Signed: 01/06/2022 11:40:40 AM [name]: [name] Entered [name]: [name] Catholic on 01/06/2022 09:05:43

## 2022-01-07 NOTE — Progress Notes (Addendum)
[name], [name] ([id number]) [id number] Page 1 of 9 Visit Report for 01/06/2022 Cellular or Tissue Based Product Details Patient Name: Date of Service: [name], [name] 01/06/2022 8:45 [name] Patient Account Number: [id number] Date of Birth/Sex: Treating RN: 01-Feb-1970 [name] Primary Care Provider: Garret [name] Other Clinician: Referring Provider: Treating Provider/Extender: [name] in Treatment: 26 Cellular or Tissue Based Product Type Wound #2 Right,Lateral,Plantar Foot Applied to: Performed By: Physician [name], MD Cellular or Tissue Based Product Type: Kerecis Omega3 Level of Consciousness (Pre-procedure): Awake and Alert Pre-procedure Verification/Time Out Yes - 09:10 Taken: Location: genitalia / hands / feet / multiple digits Wound Size (sq cm): 0.24 Product Size (sq cm): 21 Waste Size (sq cm): 1 Waste Reason: size of wound Amount of Product Applied (sq cm): 20 Instrument Used: Forceps, Scissors Lot #: [id number] Order #: [id number] Expiration Date: 01/21/2024 Fenestrated: No Reconstituted: Yes Solution Type: Normal Saline Solution Amount: 77m Lot #: [id number] Expiration Date: 05/19/2024 Secured: Yes Secured With: Steri-Strips Dressing Applied: Yes Primary Dressing: Adaptic,gauze Procedural Pain: 0 Post Procedural Pain: 0 Response to Treatment: Procedure was tolerated well Level of Consciousness (Post- Awake and Alert procedure): Post Procedure Diagnosis Same as Pre-procedure Notes scribed for Dr. RDellia Nimsby J.Scotton RN Electronic Signature(s) Signed: 01/14/2022 12:57:53 PM By: [name] PillingRN, BSN Signed: 03/10/2022 3:52:16 PM By: [name] Previous Signature: 01/09/2022 2:04:38 PM Version By: [name] CatholicRN Previous Signature: 01/06/2022 5:06:55 PM Version By: [name] Entered By: [name] 01/14/2022  12:57:53 -------------------------------------------------------------------------------- Debridement Details Patient Name: Date of Service: [name], [name] 01/06/2022 8:45 [name] Medical Record Number: [id number] Account Number: [name] ([id number] Page 2 of 9 Date of Birth/Sex: Treating RN: 311/17/1972(51y.o. [name] Primary Care Provider: Other Clinician: HGarret ReddishReferring Provider: Treating Provider/Extender: REarney Navyin Treatment: 26 Debridement Performed for Assessment: Wound #2 Right,Lateral,Plantar Foot Performed By: Physician RRicard Dillon, MD Debridement Type: Debridement Severity of Tissue Pre Debridement: Fat layer exposed Level of Consciousness (Pre-procedure): Awake and Alert Pre-procedure Verification/Time Out Yes - 09:10 Taken: Start Time: 09:10 Pain Control: Lidocaine 4% T opical Solution T Area Debrided (L x W): otal 0.4 (cm) x 0.6 (cm) = 0.24 (cm) Tissue and other material debrided: Non-Viable, Slough, Slough Level: Non-Viable Tissue Debridement Description: Selective/Open Wound Instrument: Curette Bleeding: Minimum Hemostasis Achieved: Pressure End Time: 09:12 Procedural Pain: 0 Post Procedural Pain: 0 Response to Treatment: Procedure was tolerated well Level of Consciousness (Post- Awake and Alert procedure): Post Debridement Measurements of Total Wound Length: (cm) 0.4 Width: (cm) 0.6 Depth: (cm) 1 Volume: (cm) 0.188 Character of Wound/Ulcer Post Debridement: Improved Severity of Tissue Post Debridement: Fat layer exposed Post Procedure Diagnosis Same as Pre-procedure Electronic Signature(s) Signed: 01/06/2022 5:06:55 PM By: [name] Entered By: [name] Hamon 01/06/2022 09:54:26 -------------------------------------------------------------------------------- HPI Details Patient Name: Date of Service: [name], [name]  01/06/2022 8:45 [name] Medical Record Number: [id number] Account Number: [id number] of Birth/Sex: Treating RN: 308-04-1970(51y.o. 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 The most recent amputation was in  August 2022. At that operation, antibiotic beads were placed in the wound. He has been managed by podiatry for his procedures and management of his wounds. He has been in a Water engineer. He is on oral doxycycline. They have been using Betadine wet to dry dressings along with Iodosorb. The patient states that when he thinks the wound is getting too dry, he applies topical Neosporin. At his last visit, on June 7 of this year, the podiatrist determined that he felt the wound was stalled and referred him to wound care for additional evaluation and management. An MRI has been ordered, but not yet scheduled or performed. Pathology from his operation in August 2022 demonstrated findings consistent with chronic osteomyelitis. Today, there is a large irregular wound on the plantar surface of his right foot, at about the level of the fifth metatarsal head. This tracks through to a pinpoint opening on the dorsal lateral portion of his foot. The intake nurse reported purulent drainage. There is some malodor from the wound. No frank necrosis identified. 07/11/2021: Today, the wounds do not connect. I attempted multiple times from various directions and the shared tunnel is no longer open. He has some slough [name], Quintin [id number] ([id number]) [id number] Page 3 of 9 accumulation on the dorsal part of his foot as  well as slough and callus buildup on the plantar surface. His MRI is scheduled for June 29. No purulent drainage or malodor appreciated today. 07/19/2021: The lateral foot wound has closed and there is no tunnel connecting the plantar foot wound to that site. The plantar foot wound still probes quite deeply, however. There is some slough, eschar, and nonviable tissue accumulated in the wound bed. No malodorous drainage present. His MRI was performed yesterday and is consistent with fairly extensive osteomyelitis. 07/29/2021: The patient has an appointment with infectious disease on July 18 to treat his osteomyelitis. The plantar wound still probes quite deeply, approaching bone. The orifice has narrowed quite substantially, however, making it more difficult for him to pack. 08/05/2021: The tunnel connecting the lateral foot wound to the plantar foot wound has reopened. He sees infectious disease tomorrow to discuss long-term antibiotic treatment for osteomyelitis. There was a bit of murky drainage in the wound, but this was noted after he had had topical lidocaine applied so may have just been a blob of the anesthetic. No odor or frank purulent drainage. The wound probes deeply at the midfoot approaching bone. He does have MRI results confirming his diagnosis of osteomyelitis. He has failed to progress with conventional treatment and I think his best chance for preservation of the foot is to initiate hyperbaric oxygen therapy. 08/13/2021: The tunnel connecting the 2 wounds has closed again. He has a PICC line and is getting IV daptomycin and cefepime. EKG and chest x-ray are within normal limits. He is scheduled to start hyperbaric oxygen therapy tomorrow. The wound in his midfoot probes deeply, approaching bone. The skin at the orifice continues to heaped up and threatens to close over despite the large cavity within. No erythema, induration, or purulent drainage. The wound on his lateral foot is fairly  small and quite clean. 08/19/2021: The lateral foot wound has nearly closed. The wound in his midfoot does not probe quite as deeply today. The skin at the orifice continues to try to roll in and obscure the opening. No frankly necrotic tissue appreciated. He is tolerating hyperbaric oxygen therapy. 08/26/2021: The lateral foot wound has closed completely. The wound in his midfoot  is shallower again today. The wound orifice is contracting. We are using gentamicin and silver alginate. He continues on IV daptomycin and cefepime and is tolerating hyperbaric oxygen therapy without difficulty. 09/02/2021: His foot is a little bit sore today but he was up walking on it all weekend doing back to school shopping with his daughter. The tunneling is down to about 3 cm and I do not appreciate bone at the tip of the probe. The wound is clean but has some callus creating an overhanging lip at the distal aspect. He continues to receive hyperbaric oxygen therapy as well as IV daptomycin and cefepime. 09/09/2021: His wound continues to improve. The tunnel has come in by 0.9 cm. He continues to form callus around the orifice of the wound. He is tolerating hyperbaric oxygen therapy along with his IV antibiotics. 09/16/2021: The tunneling is down to just half a centimeter. He continues to build up callus around the orifice of the wound. He completed his course of IV antibiotics and his PICC line is scheduled to be removed this Friday. He is tolerating hyperbaric oxygen without difficulty. We have been applying topical gentamicin and silver alginate to his wound. 09/24/2021: The wound depth has come in even further. There is a little bit of callus buildup around the orifice. The opening is quite narrow, at this point. 09/30/2021: The wound depth is about the same this week. The opening continues to contract with callus accumulation. There is some discoloration of the skin on the lateral part of his foot and he admitted to walking  more than usual this weekend and also wearing a different pair shoes. He continues to tolerate hyperbaric oxygen therapy without difficulty. 10/07/2021: The wound depth has contracted a bit and is now approximately 1 to 1.5 cm. The orifice of the wound continues to try and close in over the space. There is just some callus and skin around the margins obscuring the orifice. 10/14/2021: The wound depth has come in a little bit more. The orifice of the wound is rolling inwards with epithelium and callus, making it difficult to pack the wound. 10/21/2021: There is still 1 portion of the wound, at the lateral aspect, that is still a couple of centimeters deep. The architecture of the wound makes this somewhat challenging to access and pack. Everything else looks clean. He has his usual accumulation of callus and skin, narrowing the orifice. 10/29/2021: No significant change to the depth of the wound. The orifice continues to contract with callus and skin narrowing the opening. His wife has been packing the wound and doing an excellent job. 11/05/2021: The depth of the wound has come in by about a centimeter. There is less callus accumulation around the orificedespite this, the wound is narrower today. We are still awaiting insurance approval for Kerecis powder. 11/12/2021: The depth of the wound continues to contract. He has accumulated some callus around the orifice, as usual. We have received a verbal confirmation that he is approved for Kerecis, but no formal paperwork has yet been received. 11/19/2021: The depth continues to come in. There is callus accumulation around the orifice, as usual. He has been formally approved for Kerecis, but unfortunately we do not have a billing mechanism for the powder in [facility] yet. We are working to address this so we can begin treatment. 11/25/2021: Continued filling in of the depth of the wound. Continued buildup of callus around the orifice. No concern for infection. As  we do not have a way to  bill for Kerecis powder, but are capable of billing for the Kerecis sheet, we are going to use that on him instead. 12/03/2021: The depth of the wound continues to fill in. As usual, he has accumulated callus and skin around the orifice of the wound. No concern for infection. He will complete his hyperbaric oxygen therapy this week. 12/17/2021: The wound depth continues to contract. There is callus and skin accumulation around the orifice of the wound, as per usual. There is more epithelium on the actual wound surface. Unfortunately, his Leroy Kennedy was not ordered. 12/24/2021: The wound depth has come in a little bit more this week. There is less skin accumulation around the orifice, but still some callus. On questioning, he admits to spending a fair amount of time on his feet. 12/12; this is a very difficult wound to assess size slitlike wound with some depth but minimal opening. I applied Kerecis No. 4 12/18; Kerecis No. 5 Electronic Signature(s) Signed: 01/06/2022 5:06:55 PM By: Linton Ham MD Entered By: Linton [name] on 01/06/2022 09:55:05 [name], [name] ([id number]) [id number] Page 4 of 9 -------------------------------------------------------------------------------- Physical Exam Details Patient Name: Date of Service: [name], [name] 01/06/2022 8:45 [name] Patient Account Number: [id number] Date of Birth/Sex: Treating RN: 07-24-1970 (51 y.o. [name] Primary Care Provider: Garret [name] Other Clinician: Referring Provider: Treating Provider/Extender: [name] in Treatment: 26 Constitutional Sitting or standing Blood Pressure is within target range for patient.. Pulse regular and within target range for patient.Marland Kitchen Respirations regular, non-labored and within target range.. Temperature is normal and within the target range for the patient.Marland Kitchen Appears in no  distress. Notes Wound exam; same slitlike wound on the right lateral foot. Perhaps some improvement in the depth although this would not be an easy wound to assess. There is no evidence of infection. No palpable bone is peripheral pulses are palpable. Kerecis applied in the standard fashion Electronic Signature(s) Signed: 01/06/2022 5:06:55 PM By: Linton Ham MD Entered By: Linton [name] on 01/06/2022 09:57:08 -------------------------------------------------------------------------------- Physician Orders Details Patient Name: Date of Service: [name], [name] 01/06/2022 8:45 [name] Patient Account Number: [id number] Date of Birth/Sex: Treating RN: Jun 09, 1970 (51 y.o. [name] Primary Care Provider: Garret [name] Other Clinician: Referring Provider: Treating Provider/Extender: [name] in Treatment: 26 Verbal / Phone Orders: No Diagnosis Coding Follow-up Appointments ppointment in 1 week. - Dr Celine Ahr Room 2 Return A Anesthetic Wound #2 Right,Lateral,Plantar Foot (In clinic) Topical Lidocaine 4% applied to wound bed Cellular or Tissue Based Products Cellular or Tissue Based Product Type: - Kerecis #5 Cellular or Tissue Based Product applied to wound bed, secured with steri-strips, cover with Adaptic or Mepitel. (DO NOT REMOVE). Bathing/ Shower/ Hygiene May shower with protection but do not get wound dressing(s) wet. Off-Loading Other: - Try to not stand on your feet too much Wound Treatment Wound #2 - Foot Wound Laterality: Plantar, Right, Lateral Cleanser: Soap and Water Every Other Day/15 Days Discharge Instructions: May shower and wash wound with dial antibacterial soap and water prior to dressing change. Cleanser: Wound Cleanser (Generic) Every Other Day/15 Days Discharge Instructions: Cleanse the wound with wound cleanser prior to applying a clean dressing using gauze sponges, not tissue or  cotton balls. Peri-Wound Care: cotton tipped applicators (Generic) Every Other Day/15 Days [name], [name] ([id number]) [id number] Page 5 of 9 Prim Dressing: kerecis Every Other Day/15 Days ary Discharge Instructions: do not remove Secondary Dressing:  ADAPTIC TOUCH 3x4.25 in Every Other Day/15 Days Discharge Instructions: Apply over primary dressing as directed. Secondary Dressing: Woven Gauze Sponge, Non-Sterile 4x4 in Every Other Day/15 Days Discharge Instructions: Apply over primary dressing as directed. Secured With: Paper Tape, 2x10 (in/yd) (Generic) Every Other Day/15 Days Discharge Instructions: Secure dressing with tape as directed. Compression Wrap: Kerlix Roll 4.5x3.1 (in/yd) (Generic) Every Other Day/15 Days Discharge Instructions: Apply Kerlix and Coban compression as directed. Electronic Signature(s) Signed: 01/06/2022 11:40:40 AM By: Dellie Catholic RN Signed: 01/06/2022 5:06:55 PM By: Linton Ham MD Entered By: Dellie [name] on 01/06/2022 09:35:29 -------------------------------------------------------------------------------- Problem List Details Patient Name: Date of Service: [name], [name] 01/06/2022 8:45 [name] Patient Account Number: [id number] Date of Birth/Sex: Treating RN: 1970/10/05 (51 y.o. 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 01/06/2022 8:45 [name] Patient Account Number: [id number] Date of Birth/Sex: Treating RN: Aug 06, 1970 (51 y.o. 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 The most recent amputation was in August 2022. At that operation, antibiotic beads were placed in the wound. He has been managed by podiatry for his procedures and management of his wounds. He has been in a Water engineer. He is on oral doxycycline. They have been using Betadine wet to dry dressings along with Iodosorb. The patient states that when he thinks the wound is getting too dry, he applies topical Neosporin. At his last visit, on June 7 of this year, the podiatrist determined that he felt the wound was stalled and referred him to wound care for additional evaluation and management. An MRI has been ordered, but not yet scheduled or performed. Pathology from his operation in August 2022 demonstrated findings consistent with chronic osteomyelitis. Today, there is a large irregular wound on the plantar surface of his right foot, at about the level of the fifth metatarsal head. This tracks through to a pinpoint opening on the dorsal lateral portion of his foot. The intake nurse reported purulent drainage. There is some malodor from the wound. No  frank necrosis identified. 07/11/2021: Today, the wounds do not connect. I attempted multiple times from various directions and the shared tunnel is no longer open. He has some slough accumulation on the dorsal part of his foot as well as slough and callus buildup on the plantar surface. His MRI is scheduled for June 29. No purulent drainage or malodor appreciated today. 07/19/2021: The lateral foot wound has closed and there is no tunnel connecting the plantar foot wound to that site. The plantar foot wound still probes  quite deeply, however. There is some slough, eschar, and nonviable tissue accumulated in the wound bed. No malodorous drainage present. His MRI was performed yesterday and is consistent with fairly extensive osteomyelitis. 07/29/2021: The patient has an appointment with infectious disease on July 18 to treat his osteomyelitis. The plantar wound still probes quite deeply, approaching bone. The orifice has narrowed quite substantially, however, making it more difficult for him to pack. 08/05/2021: The tunnel connecting the lateral foot wound to the plantar foot wound has reopened. He sees infectious disease tomorrow to discuss long-term antibiotic treatment for osteomyelitis. There was a bit of murky drainage in the wound, but this was noted after he had had topical lidocaine applied so may have just been a blob of the anesthetic. No odor or frank purulent drainage. The wound probes deeply at the midfoot approaching bone. He does have MRI results confirming his diagnosis of osteomyelitis. He has failed to progress with conventional treatment and I think his best chance for preservation of the foot is to initiate hyperbaric oxygen therapy. 08/13/2021: The tunnel connecting the 2 wounds has closed again. He has a PICC line and is getting IV daptomycin and cefepime. EKG and chest x-ray are within normal limits. He is scheduled to start hyperbaric oxygen therapy tomorrow. The wound in his  midfoot probes deeply, approaching bone. The skin at the orifice continues to heaped up and threatens to close over despite the large cavity within. No erythema, induration, or purulent drainage. The wound on his lateral foot is fairly small and quite clean. 08/19/2021: The lateral foot wound has nearly closed. The wound in his midfoot does not probe quite as deeply today. The skin at the orifice continues to try to roll in and obscure the opening. No frankly necrotic tissue appreciated. He is tolerating hyperbaric oxygen therapy. 08/26/2021: The lateral foot wound has closed completely. The wound in his midfoot is shallower again today. The wound orifice is contracting. We are using gentamicin and silver alginate. He continues on IV daptomycin and cefepime and is tolerating hyperbaric oxygen therapy without difficulty. 09/02/2021: His foot is a little bit sore today but he was up walking on it all weekend doing back to school shopping with his daughter. The tunneling is down to about 3 cm and I do not appreciate bone at the tip of the probe. The wound is clean but has some callus creating an overhanging lip at the distal aspect. He continues to receive hyperbaric oxygen therapy as well as IV daptomycin and cefepime. 09/09/2021: His wound continues to improve. The tunnel has come in by 0.9 cm. He continues to form callus around the orifice of the wound. He is tolerating hyperbaric oxygen therapy along with his IV antibiotics. 09/16/2021: The tunneling is down to just half a centimeter. He continues to build up callus around the orifice of the wound. He completed his course of IV antibiotics and his PICC line is scheduled to be removed this Friday. He is tolerating hyperbaric oxygen without difficulty. We have been applying topical gentamicin and silver alginate to his wound. 09/24/2021: The wound depth has come in even further. There is a little bit of callus buildup around the orifice. The opening is quite  narrow, at this point. 09/30/2021: The wound depth is about the same this week. The opening continues to contract with callus accumulation. There is some discoloration of the skin on the lateral part of his foot and he admitted to walking more than usual this weekend and also  wearing a different pair shoes. He continues to tolerate hyperbaric oxygen therapy without difficulty. 10/07/2021: The wound depth has contracted a bit and is now approximately 1 to 1.5 cm. The orifice of the wound continues to try and close in over the space. There is just some callus and skin around the margins obscuring the orifice. 10/14/2021: The wound depth has come in a little bit more. The orifice of the wound is rolling inwards with epithelium and callus, making it difficult to pack the wound. 10/21/2021: There is still 1 portion of the wound, at the lateral aspect, that is still a couple of centimeters deep. The architecture of the wound makes this somewhat challenging to access and pack. Everything else looks clean. He has his usual accumulation of callus and skin, narrowing the orifice. 10/29/2021: No significant change to the depth of the wound. The orifice continues to contract with callus and skin narrowing the opening. His wife has been packing the wound and doing an excellent job. 11/05/2021: The depth of the wound has come in by about a centimeter. There is less callus accumulation around the orificeoodespite this, the wound is narrower today. We are still awaiting insurance approval for Kerecis powder. 11/12/2021: The depth of the wound continues to contract. He has accumulated some callus around the orifice, as usual. We have received a verbal confirmation that he is approved for Kerecis, but no formal paperwork has yet been received. [name], [name] ([id number]) [id number] Page 7 of 9 11/19/2021: The depth continues to come in. There is callus accumulation around the orifice, as  usual. He has been formally approved for Kerecis, but unfortunately we do not have a billing mechanism for the powder in [employer] yet. We are working to address this so we can begin treatment. 11/25/2021: Continued filling in of the depth of the wound. Continued buildup of callus around the orifice. No concern for infection. As we do not have a way to bill for Kerecis powder, but are capable of billing for the [facility] sheet, we are going to use that on him instead. 12/03/2021: The depth of the wound continues to fill in. As usual, he has accumulated callus and skin around the orifice of the wound. No concern for infection. He will complete his hyperbaric oxygen therapy this week. 12/17/2021: The wound depth continues to contract. There is callus and skin accumulation around the orifice of the wound, as per usual. There is more epithelium on the actual wound surface. Unfortunately, his Leroy Kennedy was not ordered. 12/24/2021: The wound depth has come in a little bit more this week. There is less skin accumulation around the orifice, but still some callus. On questioning, he admits to spending a fair amount of time on his feet. 12/12; this is a very difficult wound to assess size slitlike wound with some depth but minimal opening. I applied Kerecis No. 4 12/18; Kerecis No. 5 Objective Constitutional Sitting or standing Blood Pressure is within target range for patient.. Pulse regular and within target range for patient.Marland Kitchen Respirations regular, non-labored and within target range.. Temperature is normal and within the target range for the patient.Marland Kitchen Appears in no distress. Vitals Time Taken: 8:54 AM, Height: 72 in, Weight: 312 lbs, BMI: 42.3, Temperature: 98.3 F, Pulse: 72 bpm, Respiratory Rate: 18 breaths/min, Blood Pressure: 132/81 mmHg. General Notes: Wound exam; same slitlike wound on the right lateral foot. Perhaps some improvement in the depth although this would not be an easy wound to assess. There is  no evidence of  infection. No palpable bone is peripheral pulses are palpable. Kerecis applied in the standard fashion Integumentary (Hair, Skin) Wound #2 status is Open. Original cause of wound was Gradually Appeared. The date acquired was: 12/19/2016. The wound has been in treatment 26 weeks. The wound is located on the [place]. The wound measures 0.4cm length x 0.6cm width x 1cm depth; 0.188cm^2 area and 0.188cm^3 volume. There is Fat Layer (Subcutaneous Tissue) exposed. There is undermining starting at 6:00 and ending at 7:00 with a maximum distance of 0.4cm. There is a medium amount of serosanguineous drainage noted. The wound margin is thickened. There is medium (34-66%) pink granulation within the wound bed. There is a medium (34-66%) amount of necrotic tissue within the wound bed including Adherent Slough. The periwound skin appearance had no abnormalities noted for moisture. The periwound skin appearance had no abnormalities noted for color. The periwound skin appearance exhibited: Callus. Periwound temperature was noted as No Abnormality. 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 There was a Selective/Open Wound Non-Viable Tissue Debridement with a total area of 0.24 sq cm performed by [name], MD. With the following instrument(s): Curette to remove Non-Viable tissue/material. Material removed includes [facility] after achieving pain control using Lidocaine 4% T opical Solution. No specimens were taken. A time out was conducted at 09:10, prior to the start of the procedure.  A Minimum amount of bleeding was controlled with Pressure. The procedure was tolerated well with a pain level of 0 throughout and a pain level of 0 following the procedure. Post Debridement Measurements: 0.4cm length x 0.6cm width x 1cm depth; 0.188cm^3 volume. Character of Wound/Ulcer Post Debridement is improved. Severity of Tissue Post Debridement is: Fat layer exposed. Post procedure Diagnosis Wound #2: Same as Pre-Procedure Pre-procedure diagnosis of Wound #2 is a Diabetic Wound/Ulcer of the Lower Extremity located on the Right,Lateral,Plantar Foot. A skin graft procedure using a bioengineered skin substitute/cellular or tissue based product was performed by [name], MD with the following instrument(s): Forceps and Scissors. Kerecis Omega3 was applied and secured with Steri-Strips. 3 sq cm of product was utilized and 1 sq cm was wasted due to size of wound. Post Application, Adaptic,gauze was applied. A Time Out was conducted at 09:10, prior to the start of the procedure. The procedure was tolerated well with a pain level of 0 throughout and a pain level of 0 following the procedure. Post procedure Diagnosis Wound #2: Same as Pre-Procedure General Notes: scribed for Dr. Dellia Nims by J.Scotton RN. [name], [name] ([id number]) [id number] Page 8 of 9 Plan Follow-up Appointments: Return Appointment in 1 week. - Dr Celine Ahr Room 2 Anesthetic: Wound #2 Right,Lateral,Plantar Foot: (In clinic) Topical Lidocaine 4% applied to wound bed Cellular or Tissue Based Products: Cellular or Tissue Based Product Type: - Kerecis #5 Cellular or Tissue Based Product applied to wound bed, secured with steri-strips, cover with Adaptic or Mepitel. (DO NOT REMOVE). Bathing/ Shower/ Hygiene: May shower with protection but do not get wound dressing(s) wet. Off-Loading: Other: - Try to not stand on your feet too much WOUND #2: - Foot Wound Laterality: Plantar, Right,  Lateral Cleanser: Soap and Water Every Other Day/15 Days Discharge  Instructions: May shower and wash wound with dial antibacterial soap and water prior to dressing change. Cleanser: Wound Cleanser (Generic) Every Other Day/15 Days Discharge Instructions: Cleanse the wound with wound cleanser prior to applying a clean dressing using gauze sponges, not tissue or cotton balls. Peri-Wound Care: cotton tipped applicators (Generic) Every Other Day/15 Days Prim Dressing: kerecis Every Other Day/15 Days ary Discharge Instructions: do not remove Secondary Dressing: ADAPTIC TOUCH 3x4.25 in Every Other Day/15 Days Discharge Instructions: Apply over primary dressing as directed. Secondary Dressing: Woven Gauze Sponge, Non-Sterile 4x4 in Every Other Day/15 Days Discharge Instructions: Apply over primary dressing as directed. Secured With: Paper T ape, 2x10 (in/yd) (Generic) Every Other Day/15 Days Discharge Instructions: Secure dressing with tape as directed. Com pression Wrap: Kerlix Roll 4.5x3.1 (in/yd) (Generic) Every Other Day/15 Days Discharge Instructions: Apply Kerlix and Coban compression as directed. 1. Kerecis applied in the standard fashion 2. Is very difficult to determine improvement here as this is not an easy wound to measure Electronic Signature(s) Signed: 01/09/2022 2:04:38 PM By: Dellie Catholic RN Signed: 03/10/2022 3:52:16 PM By: Linton Ham MD Previous Signature: 01/06/2022 11:40:40 AM Version By: Dellie Catholic RN Previous Signature: 01/06/2022 5:06:55 PM Version By: Linton Ham MD Entered By: Dellie [name] on 01/09/2022 13:30:59 -------------------------------------------------------------------------------- SuperBill Details Patient Name: Date of Service: [name], [name] 01/06/2022 Medical Record Number: [id number] Patient Account Number: [id number] Date of Birth/Sex: Treating RN: Jul 02, 1970 (51 y.o. [name] Primary Care Provider: Garret [name] Other  Clinician: Referring Provider: Treating Provider/Extender: [name] in Treatment: 26 Diagnosis Coding ICD-10 Codes Code Description E11.49 Type 2 diabetes mellitus with other diabetic neurological complication 123456 Other chronic osteomyelitis, right ankle and foot L97.514 Non-pressure chronic ulcer of other part of right foot with necrosis of bone Facility Procedures : [name], E CPT4 Code: [id number] DWARD [name] ([id number]) ICD-1 E L Description: 5275 - SKIN SUB GRAFT FACE/NK/HF/G [phone] Diagnosis Description 11.49 Type 2 diabetes mellitus with other diabetic neurological complication [id number] Non-pressure chronic ulcer of other part of right foot with necrosis of bone Modifier: [id number] Quantity: 1 .pdf Page 9 of 9 : CPT4 Code: [id number] Q4 IC E L Description: 44 Kerecis Omega Marigen 3 x 7 qty 21 D-10 Diagnosis Description 11.49 Type 2 diabetes mellitus with other diabetic neurological complication [id number] Non-pressure chronic ulcer of other part of right foot with necrosis of bone Modifier: 21 Quantity: Physician Procedures : CPT4 Code Description Modifier [id number] - WC PHYS SKIN SUB GRAFT FACE/NK/HF/G ICD-10 Diagnosis Description E11.49 Type 2 diabetes mellitus with other diabetic neurological complication [id number] Non-pressure chronic ulcer of other part of right foot  with necrosis of bone Quantity: 1 Electronic Signature(s) Signed: 01/14/2022 12:59:00 PM By: [name] Pilling RN, BSN Signed: 03/10/2022 3:52:16 PM By: Linton Ham MD Previous Signature: 01/14/2022 12:58:39 PM Version By: [name] Pilling RN, BSN Previous Signature: 01/09/2022 2:04:38 PM Version By: Dellie Catholic RN Previous Signature: 01/06/2022 5:06:55 PM Version By: Linton Ham MD Entered By: [name] on 01/14/2022 12:58:59

## 2022-01-15 ENCOUNTER — Encounter (HOSPITAL_BASED_OUTPATIENT_CLINIC_OR_DEPARTMENT_OTHER): Payer: BLUE CROSS/BLUE SHIELD | Admitting: General Surgery

## 2022-01-15 DIAGNOSIS — E11621 Type 2 diabetes mellitus with foot ulcer: Secondary | ICD-10-CM | POA: Diagnosis not present

## 2022-01-16 NOTE — Progress Notes (Signed)
[name], Nasim E ([id number]) [id number] Page 1 of 8 Visit Report for 01/15/2022 Arrival Information Details Patient Name: Date of Service: [name], [name] 01/15/2022 8:00 [name] Medical Record Number: [id number] Patient Account Number: [id number] Date of Birth/Sex: Treating RN: 1970-02-04 [name], [name] Primary Care Giuliano Preece: [name] Other Clinician: Referring Storie Heffern: Treating Darion Milewski/Extender: [name] in Treatment: 27 Visit Information History Since Last Visit Added or deleted any medications: No Patient Arrived: Ambulatory Any new allergies or adverse reactions: No Arrival Time: 08:01 Had a fall or experienced change in No Accompanied By: self activities of daily living that may affect Transfer Assistance: None risk of falls: Patient Identification Verified: Yes Signs or symptoms of abuse/neglect since last visito No Secondary Verification Process Completed: Yes Hospitalized since last visit: No Patient Requires Transmission-Based Precautions: No Implantable device outside of the clinic excluding No Patient Has Alerts: Yes cellular tissue based products placed in the center since last visit: Has Dressing in Place as Prescribed: Yes Pain Present Now: No Electronic Signature(s) Signed: 01/16/2022 4:52:17 PM By: Rhae Hammock RN Entered By: Rhae Hammock on 01/15/2022 08:01:40 -------------------------------------------------------------------------------- Encounter Discharge Information Details Patient Name: Date of Service: [name] 01/15/2022 8:00 [name] Medical Record Number: [id number] Patient Account Number: [id number] Date of Birth/Sex: Treating RN: 05-06-1970 [name], [name] Primary Care [name]: [name] Other Clinician: Referring Sheran Newstrom: Treating [name]/Extender: [name] in Treatment: 27 Encounter Discharge  Information Items Post Procedure Vitals Discharge Condition: Stable Temperature (F): 98.0 Ambulatory Status: Ambulatory Pulse (bpm): 74 Discharge Destination: Home Respiratory Rate (breaths/min): 17 Transportation: Private Auto Blood Pressure (mmHg): 139/84 Accompanied By: self Schedule Follow-up Appointment: Yes Clinical Summary of Care: Patient Declined Electronic Signature(s) Signed: 01/16/2022 4:52:17 PM By: Rhae Hammock RN Entered By: Rhae Hammock on 01/15/2022 08:23:11 [name], [name] ([id number]) [id number] Page 2 of 8 -------------------------------------------------------------------------------- Lower Extremity Assessment Details Patient Name: Date of Service: [name] 01/15/2022 8:00 [name] Medical Record Number: [id number] Patient Account Number: [id number] Date of Birth/Sex: Treating RN: [birth year] [name], [name] Primary Care [name]: [name] Other Clinician: Referring [name]: Treating [name]/Extender: [name] in Treatment: 27 Edema Assessment Assessed: [name]: No] [name]: Yes] Edema: [Left: N] [Right: o] Calf Left: Right: Point of Measurement: From Medial Instep 39 cm Ankle Left: Right: Point of Measurement: From Medial Instep 23 cm Vascular Assessment Pulses: Dorsalis Pedis Palpable: [Right:Yes] Posterior Tibial Palpable: [Right:Yes] Electronic Signature(s) Signed: 01/16/2022 4:52:17 PM By: Rhae Hammock RN Entered By: Rhae Hammock on 01/15/2022 08:07:00 -------------------------------------------------------------------------------- Multi Wound Chart Details Patient Name: Date of Service: [name] 01/15/2022 8:00 [name] Medical Record Number: [id number] Patient Account Number: [id number] Date of Birth/Sex: Treating RN: [birth year] (51 y.o. [name] Primary Care [name]: [name] Other Clinician: Referring [name]: Treating  [name]/Extender: [name] in Treatment: 27 Vital Signs Height(in): 72 Pulse(bpm): Weight(lbs): 312 Blood Pressure(mmHg): Body Mass Index(BMI): 42.3 Temperature(F): Respiratory Rate(breaths/min): 17 [2:Photos:] [N/A:N/A] Right, Lateral, Plantar Foot N/A N/A Wound Location: Gradually Appeared N/A N/A Wounding Event: Diabetic Wound/Ulcer of the Lower N/A N/A Primary Etiology: Extremity Type II Diabetes N/A N/A Comorbid History: 12/19/2016 N/A N/A Date Acquired: 41 N/A N/A Weeks of Treatment: Open N/A N/A Wound Status: No N/A N/A Wound Recurrence: 0.4x1.3x1.7 N/A N/A Measurements L x W x D (cm) 0.408 N/A N/A A (cm) : rea 0.694 N/A N/A Volume (cm) : 88.70% N/A N/A % Reduction  in A rea: 96.20% N/A N/A % Reduction in Volume: 7 Starting Position 1 (o'clock): 4 Ending Position 1 (o'clock): 1 Maximum Distance 1 (cm): Yes N/A N/A Undermining: Grade 3 N/A N/A Classification: Medium N/A N/A Exudate A mount: Serosanguineous N/A N/A Exudate Type: red, brown N/A N/A Exudate Color: Thickened N/A N/A Wound Margin: Medium (34-66%) N/A N/A Granulation A mount: Pink N/A N/A Granulation Quality: Medium (34-66%) N/A N/A Necrotic A mount: Fat Layer (Subcutaneous Tissue): Yes N/A N/A Exposed Structures: Fascia: No Tendon: No Muscle: No Joint: No Bone: No Small (1-33%) N/A N/A Epithelialization: Debridement - Selective/Open Wound N/A N/A Debridement: Pre-procedure Verification/Time Out 08:17 N/A N/A Taken: Lidocaine N/A N/A Pain Control: Callus N/A N/A Tissue Debrided: Skin/Epidermis N/A N/A Level: 0.52 N/A N/A Debridement A (sq cm): rea Curette N/A N/A Instrument: Minimum N/A N/A Bleeding: Pressure N/A N/A Hemostasis A chieved: 0 N/A N/A Procedural Pain: 0 N/A N/A Post Procedural Pain: Procedure was tolerated well N/A N/A Debridement Treatment Response: 0.4x1.3x1.7 N/A N/A Post Debridement Measurements L x W  x D (cm) 0.694 N/A N/A Post Debridement Volume: (cm) Callus: Yes N/A N/A Periwound Skin Texture: No Abnormalities Noted N/A N/A Periwound Skin Moisture: No Abnormalities Noted N/A N/A Periwound Skin Color: No Abnormality N/A N/A Temperature: Debridement N/A N/A Procedures Performed: Treatment Notes Wound #2 (Foot) Wound Laterality: Plantar, Right, Lateral Cleanser Soap and Water Discharge Instruction: May shower and wash wound with dial antibacterial soap and water prior to dressing change. Wound Cleanser Discharge Instruction: Cleanse the wound with wound cleanser prior to applying a clean dressing using gauze sponges, not tissue or cotton balls. Peri-Wound Care cotton tipped applicators Topical Primary Dressing Iodoform packing strip 1/2 (in) Discharge Instruction: Lightly pack as instructed Secondary Dressing Woven Gauze Sponge, Non-Sterile 4x4 in Discharge Instruction: Apply over primary dressing as directed. Secured With [name], [name] ([id number]) [id number] Page 4 of 8 Paper Tape, 2x10 (in/yd) Discharge Instruction: Secure dressing with tape as directed. Compression Wrap Kerlix Roll 4.5x3.1 (in/yd) Discharge Instruction: Apply Kerlix and Coban compression as directed. Compression Stockings Add-Ons Electronic Signature(s) Signed: 01/15/2022 8:31:45 AM By: [name] MD FACS Entered By: [name] on 01/15/2022 08:31:44 -------------------------------------------------------------------------------- Multi-Disciplinary Care Plan Details Patient Name: Date of Service: [name] 01/15/2022 8:00 [name] Medical Record Number: [id number] Patient Account Number: [id number] Date of Birth/Sex: Treating RN: 09-Apr-1970 [name], [name] Primary Care Aramis Weil: [name] Other Clinician: Referring Jakorey Mcconathy: Treating Estelle Greenleaf/Extender: [name] in Treatment: [place] reviewed with physician Active Inactive Nutrition Nursing Diagnoses: Impaired glucose control: actual or potential Potential for alteratiion in Nutrition/Potential for imbalanced nutrition Goals: Patient/caregiver will maintain therapeutic glucose control Date Initiated: 07/29/2021 Target Resolution Date: 02/21/2022 Goal Status: Active Interventions: Assess patient nutrition upon admission and as needed per policy Provide education on elevated blood sugars and impact on wound healing Treatment Activities: Patient referred to Primary Care Physician for further nutritional evaluation : 07/29/2021 Notes: Osteomyelitis Nursing Diagnoses: Infection: osteomyelitis Knowledge deficit related to disease process and management Goals: Patient/caregiver will verbalize understanding of disease process and disease management Date Initiated: 07/29/2021 Date Inactivated: 11/12/2021 Target Resolution Date: 11/19/2021 Goal Status: Met Patient's osteomyelitis will resolve Date Initiated: 07/29/2021 Target Resolution Date: 02/21/2022 Goal Status: Active Interventions: Assess for signs and symptoms of osteomyelitis resolution every visit Provide education on osteomyelitis [name], [name] ([id number]) [id number] Page 5 of 8 Treatment Activities: Consult for HBO : 07/29/2021 Notes: Electronic Signature(s) Signed: 01/16/2022 4:52:17 PM By: Rhae Hammock  RN Entered By: Rhae Hammock on 01/15/2022 08:19:15 -------------------------------------------------------------------------------- Pain Assessment Details Patient Name: Date of Service: [name] 01/15/2022 8:00 [name] Medical Record Number: [id number] Patient Account Number: [id number] Date of Birth/Sex: Treating RN: 04-24-1970 [name], [name] Primary Care Jillann Charette: [name] Other Clinician: Referring Adiba Fargnoli: Treating Kaoru Rezendes/Extender: [name]  in Treatment: 27 Active Problems Location of Pain Severity and Description of Pain Patient Has Paino No Site Locations Pain Management and Medication Current Pain Management: Electronic Signature(s) Signed: 01/16/2022 4:52:17 PM By: Rhae Hammock RN Entered By: Rhae Hammock on 01/15/2022 08:01:51 -------------------------------------------------------------------------------- Patient/Caregiver Education Details Patient Name: Date of Service: [name], [name] 12/27/2023andnbsp8:00 [name] Medical Record Number: [id number] Patient Account Number: [id number] Date of Birth/Gender: Treating RN: April 25, 1970 (51 y.o. Erie Noe Primary Care Physician: [name] Other Clinician: Referring Physician: Treating Physician/Extender: [name] in Treatment: 27 [name], [name] ([id number]) [id number] Page 6 of 8 Education Assessment Education Provided To: Patient Education Topics Provided Wound/Skin Impairment: Methods: Explain/Verbal Responses: Reinforcements needed, State content correctly Electronic Signature(s) Signed: 01/16/2022 4:52:17 PM By: Rhae Hammock RN Entered By: Rhae Hammock on 01/15/2022 08:19:37 -------------------------------------------------------------------------------- Wound Assessment Details Patient Name: Date of Service: [name] 01/15/2022 8:00 [name] Medical Record Number: [id number] Patient Account Number: [id number] Date of Birth/Sex: Treating RN: 04-Mar-1970 [name], [name] Primary Care Segundo Makela: [name] Other Clinician: Referring Recie Cirrincione: Treating Kinzie Wickes/Extender: [name] in Treatment: 27 Wound Status Wound Number: 2 Primary Etiology: Diabetic Wound/Ulcer of the Lower Extremity Wound Location: Right, Lateral, Plantar Foot Wound Status: Open Wounding Event: Gradually Appeared Comorbid History: Type II  Diabetes Date Acquired: 12/19/2016 Weeks Of Treatment: 27 Clustered Wound: No Photos Wound Measurements Length: (cm) 0.4 Width: (cm) 1.3 Depth: (cm) 1.7 Area: (cm) 0.408 Volume: (cm) 0.694 % Reduction in Area: 88.7% % Reduction in Volume: 96.2% Epithelialization: Small (1-33%) Tunneling: No Undermining: Yes Starting Position (o'clock): 7 Ending Position (o'clock): 4 Maximum Distance: (cm) 1 Wound Description Classification: Grade 3 Wound Margin: Thickened Exudate Amount: Medium Exudate Type: Serosanguineous Exudate Color: red, brown [name], [name] ([id number]) Wound Bed Granulation Amount: Medium (34-66% Granulation Quality: Pink Necrotic Amount: Medium (34-66% Foul Odor After Cleansing: No Slough/Fibrino Yes [id number] Page 7 of 8 ) Exposed Structure Fascia Exposed: No ) Fat Layer (Subcutaneous Tissue) Exposed: Yes Tendon Exposed: No Muscle Exposed: No Joint Exposed: No Bone Exposed: No Periwound Skin Texture Texture Color No Abnormalities Noted: No No Abnormalities Noted: Yes Callus: Yes Temperature / Pain Temperature: No Abnormality Moisture No Abnormalities Noted: Yes Treatment Notes Wound #2 (Foot) Wound Laterality: Plantar, Right, Lateral Cleanser Soap and Water Discharge Instruction: May shower and wash wound with dial antibacterial soap and water prior to dressing change. Wound Cleanser Discharge Instruction: Cleanse the wound with wound cleanser prior to applying a clean dressing using gauze sponges, not tissue or cotton balls. Peri-Wound Care cotton tipped applicators Topical Primary Dressing Iodoform packing strip 1/2 (in) Discharge Instruction: Lightly pack as instructed Secondary Dressing Woven Gauze Sponge, Non-Sterile 4x4 in Discharge Instruction: Apply over primary dressing as directed. Secured With Paper Tape, 2x10 (in/yd) Discharge Instruction: Secure dressing with tape as directed. Compression  Wrap Kerlix Roll 4.5x3.1 (in/yd) Discharge Instruction: Apply Kerlix and Coban compression as directed. Compression Stockings Add-Ons Electronic Signature(s) Signed: 01/16/2022 4:52:17 PM By: Rhae Hammock RN Entered By: Rhae Hammock on 01/15/2022 08:16:23 -------------------------------------------------------------------------------- Vitals Details Patient Name: Date of Service: [name] [place], [name]  [name] 01/15/2022 8:00 [name] Medical Record Number: [id number] Patient Account Number: [id number] Date of Birth/Sex: Treating RN: 1970/05/16 [name], [name] Primary Care Mailen Newborn: [name] Other Clinician: Referring Naw Lasala: Treating Maloree Uplinger/Extender: [name] in Treatment: [address] [name], [name] ([id number]) [id number] Page 8 of 8 Time Taken: 08:01 Respiratory Rate (breaths/min): 17 Height (in): 72 Reference Range: 80 - 120 mg / dl Weight (lbs): 312 Body Mass Index (BMI): 42.3 Electronic Signature(s) Signed: 01/16/2022 4:52:17 PM By: Rhae Hammock RN Entered By: Rhae Hammock on 01/15/2022 08:02:25

## 2022-01-16 NOTE — Progress Notes (Signed)
[name] [name], [name] [name] ([id number]) [id number] Page 1 of 9 Visit Report for 01/15/2022 Chief Complaint Document Details Patient Name: Date of Service: [name], [name] [name] 01/15/2022 8:00 A [name] Medical Record Number: [id number] Patient Account Number: [id number] Date of Birth/Sex: Treating RN: 02-Jun-1970 [name] Primary Care Provider: Garret [name] Other Clinician: Referring Provider: Treating Provider/Extender: [name] [name] in Treatment: 27 Information Obtained from: Patient Chief Complaint Patients presents for treatment of an open diabetic ulcer Electronic Signature(s) Signed: 01/15/2022 8:31:52 AM By: [name] Maudlin MD FACS Entered By: [name] [name] on 01/15/2022 08:31:52 -------------------------------------------------------------------------------- Debridement Details Patient Name: Date of Service: [name], [name] [name] 01/15/2022 8:00 A [name] Medical Record Number: [id number] Patient Account Number: [id number] Date of Birth/Sex: Treating RN: [birth year] (51 y.o. [name] [name], [name] Primary Care Provider: Garret [name] Other Clinician: Referring Provider: Treating Provider/Extender: [name] [name] in Treatment: 27 Debridement Performed for Assessment: Wound #2 [place] Performed By: Physician [name] Maudlin, MD Debridement Type: Debridement Severity of Tissue Pre Debridement: Fat layer exposed Level of Consciousness (Pre-procedure): Awake and Alert Pre-procedure Verification/Time Out Yes - 08:17 Taken: Start Time: 08:17 Pain Control: Lidocaine T Area Debrided (L x W): otal 0.4 (cm) x 1.3 (cm) = 0.52 (cm) Tissue and other material debrided: Viable, Non-Viable, Callus, Skin: Dermis , Skin: Epidermis Level: Skin/Epidermis Debridement Description: Selective/Open Wound Instrument: Curette Bleeding: Minimum Hemostasis Achieved: Pressure End Time: 08:17 Procedural Pain: 0 Post  Procedural Pain: 0 Response to Treatment: Procedure was tolerated well Level of Consciousness (Post- Awake and Alert procedure): Post Debridement Measurements of Total Wound Length: (cm) 0.4 Width: (cm) 1.3 Depth: (cm) 1.7 Volume: (cm) 0.694 Character of Wound/Ulcer Post Debridement: Improved Severity of Tissue Post Debridement: Fat layer exposed [name] [name], [name] [name] ([id number]) [id number] Page 2 of 9 Post Procedure Diagnosis Same as Pre-procedure Electronic Signature(s) Signed: 01/15/2022 8:36:05 AM By: [name] Maudlin MD FACS Signed: 01/16/2022 4:52:17 PM By: [name] Hammock RN Entered By: [name] [name] on 01/15/2022 08:18:43 -------------------------------------------------------------------------------- HPI Details Patient Name: Date of Service: [name], [name] [name] 01/15/2022 8:00 A [name] Medical Record Number: [id number] Patient Account Number: [id number] Date of Birth/Sex: Treating RN: Dec 27, 1970 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 The most recent amputation was in August 2022. At that operation, antibiotic beads were placed in the wound. He has been managed by podiatry for his procedures and management of his wounds. He has been in a Water engineer. He is on oral doxycycline. They have been using Betadine wet to dry dressings along with Iodosorb. The patient states that when he thinks the wound is getting too dry, he applies topical Neosporin. At his last visit, on June 7 of this year, the podiatrist determined that he felt the wound was stalled and referred him to wound  care for additional evaluation and management. An MRI has been ordered, but not yet scheduled or performed. Pathology from his operation in August 2022 demonstrated findings consistent with chronic osteomyelitis. Today, there is a large irregular wound on the plantar surface of his right foot, at about the level of the fifth metatarsal head. This tracks through to a pinpoint opening on the dorsal lateral portion of his  foot. The intake nurse reported purulent drainage. There is some malodor from the wound. No frank necrosis identified. 07/11/2021: Today, the wounds do not connect. I attempted multiple times from various directions and the shared tunnel is no longer open. He has some slough accumulation on the dorsal part of his foot as well as slough and callus buildup on the plantar surface. His MRI is scheduled for June 29. No purulent drainage or malodor appreciated today. 07/19/2021: The lateral foot wound has closed and there is no tunnel connecting the plantar foot wound to that site. The plantar foot wound still probes quite deeply, however. There is some slough, eschar, and nonviable tissue accumulated in the wound bed. No malodorous drainage present. His MRI was performed yesterday and is consistent with fairly extensive osteomyelitis. 07/29/2021: The patient has an appointment with infectious disease on July 18 to treat his osteomyelitis. The plantar wound still probes quite deeply, approaching bone. The orifice has narrowed quite substantially, however, making it more difficult for him to pack. 08/05/2021: The tunnel connecting the lateral foot wound to the plantar foot wound has reopened. He sees infectious disease tomorrow to discuss long-term antibiotic treatment for osteomyelitis. There was a bit of murky drainage in the wound, but this was noted after he had had topical lidocaine applied so may have just been a blob of the anesthetic. No odor or frank purulent drainage. The wound probes  deeply at the midfoot approaching bone. He does have MRI results confirming his diagnosis of osteomyelitis. He has failed to progress with conventional treatment and I think his best chance for preservation of the foot is to initiate hyperbaric oxygen therapy. 08/13/2021: The tunnel connecting the 2 wounds has closed again. He has a PICC line and is getting IV daptomycin and cefepime. EKG and chest x-ray are within normal limits. He is scheduled to start hyperbaric oxygen therapy tomorrow. The wound in his midfoot probes deeply, approaching bone. The skin at the orifice continues to heaped up and threatens to close over despite the large cavity within. No erythema, induration, or purulent drainage. The wound on his lateral foot is fairly small and quite clean. 08/19/2021: The lateral foot wound has nearly closed. The wound in his midfoot does not probe quite as deeply today. The skin at the orifice continues to try to roll in and obscure the opening. No frankly necrotic tissue appreciated. He is tolerating hyperbaric oxygen therapy. 08/26/2021: The lateral foot wound has closed completely. The wound in his midfoot is shallower again today. The wound orifice is contracting. We are using gentamicin and silver alginate. He continues on IV daptomycin and cefepime and is tolerating hyperbaric oxygen therapy without difficulty. 09/02/2021: His foot is a little bit sore today but he was up walking on it all weekend doing back to school shopping with his daughter. The tunneling is down to about 3 cm and I do not appreciate bone at the tip of the probe. The wound is clean but has some callus creating an overhanging lip at the distal aspect. He continues to receive hyperbaric oxygen therapy as well as IV daptomycin and cefepime. 09/09/2021: His wound continues to improve. The tunnel has come in by 0.9 cm. He continues to form callus around the orifice of the wound. He is tolerating hyperbaric oxygen therapy along  with his IV antibiotics. 09/16/2021: The tunneling is down to just half a centimeter. He continues to build up callus around the orifice of the wound. He completed his course of IV  antibiotics and his PICC line is scheduled to be removed this Friday. He is tolerating hyperbaric oxygen without difficulty. We have been applying topical gentamicin and silver alginate to his wound. 09/24/2021: The wound depth has come in even further. There is a little bit of callus buildup around the orifice. The opening is quite narrow, at this point. [name] [name], [name] [name] ([id number]) [id number] Page 3 of 9 09/30/2021: The wound depth is about the same this week. The opening continues to contract with callus accumulation. There is some discoloration of the skin on the lateral part of his foot and he admitted to walking more than usual this weekend and also wearing a different pair shoes. He continues to tolerate hyperbaric oxygen therapy without difficulty. 10/07/2021: The wound depth has contracted a bit and is now approximately 1 to 1.5 cm. The orifice of the wound continues to try and close in over the space. There is just some callus and skin around the margins obscuring the orifice. 10/14/2021: The wound depth has come in a little bit more. The orifice of the wound is rolling inwards with epithelium and callus, making it difficult to pack the wound. 10/21/2021: There is still 1 portion of the wound, at the lateral aspect, that is still a couple of centimeters deep. The architecture of the wound makes this somewhat challenging to access and pack. Everything else looks clean. He has his usual accumulation of callus and skin, narrowing the orifice. 10/29/2021: No significant change to the depth of the wound. The orifice continues to contract with callus and skin narrowing the opening. His wife has been packing the wound and doing an excellent job. 11/05/2021: The depth of the wound has come in by  about a centimeter. There is less callus accumulation around the orificedespite this, the wound is narrower today. We are still awaiting insurance approval for Kerecis powder. 11/12/2021: The depth of the wound continues to contract. He has accumulated some callus around the orifice, as usual. We have received a verbal confirmation that he is approved for Kerecis, but no formal paperwork has yet been received. 11/19/2021: The depth continues to come in. There is callus accumulation around the orifice, as usual. He has been formally approved for Kerecis, but unfortunately we do not have a billing mechanism for the powder in [employer] yet. We are working to address this so we can begin treatment. 11/25/2021: Continued filling in of the depth of the wound. Continued buildup of callus around the orifice. No concern for infection. As we do not have a way to bill for Kerecis powder, but are capable of billing for the [facility] sheet, we are going to use that on him instead. 12/03/2021: The depth of the wound continues to fill in. As usual, he has accumulated callus and skin around the orifice of the wound. No concern for infection. He will complete his hyperbaric oxygen therapy this week. 12/17/2021: The wound depth continues to contract. There is callus and skin accumulation around the orifice of the wound, as per usual. There is more epithelium on the actual wound surface. Unfortunately, his Leroy Kennedy was not ordered. 12/24/2021: The wound depth has come in a little bit more this week. There is less skin accumulation around the orifice, but still some callus. On questioning, he admits to spending a fair amount of time on his feet. 12/12; this is a very difficult wound to assess size slitlike wound with some depth but minimal opening. I applied Kerecis No. 4 12/18; Kerecis No. 5 01/15/2022:  His wound measured deeper today. There is also some evidence of pressure induced tissue injury around the lateral aspect of  his foot. The patient reports that he was wearing different shoes than usual and felt like his foot was rolling a lot over the weekend. There is heavy callus around the orifice of the wound. No concern for infection. Electronic Signature(s) Signed: 01/15/2022 8:32:48 AM By: [name] Maudlin MD FACS Entered By: [name] [name] on 01/15/2022 08:32:47 -------------------------------------------------------------------------------- Physical Exam Details Patient Name: Date of Service: [name], [name] [name] 01/15/2022 8:00 A [name] Medical Record Number: [id number] Patient Account Number: [id number] Date of Birth/Sex: Treating RN: 12-26-1970 [name] Primary Care Provider: Garret [name] Other Clinician: Referring Provider: Treating Provider/Extender: [name] [name] in Treatment: 27 Constitutional . No acute distress. Respiratory . Notes 01/15/2022: His wound measured deeper today. There is also some evidence of pressure induced tissue injury around the lateral aspect of his foot. There is heavy callus around the orifice of the wound. No concern for infection. Electronic Signature(s) Signed: 01/15/2022 8:33:27 AM By: [name] Maudlin MD FACS Entered By: [name] [name] on 01/15/2022 08:33:27 [name] [name], [name] [name] ([id number]) [id number] Page 4 of 9 -------------------------------------------------------------------------------- Physician Orders Details Patient Name: Date of Service: [name], [name] [name] 01/15/2022 8:00 A [name] Medical Record Number: [id number] Patient Account Number: [id number] Date of Birth/Sex: Treating RN: 1970/10/27 (51 y.o. 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 - Dr Celine Ahr Return A Anesthetic Wound #2 [place] (In clinic) Topical Lidocaine 4% applied to wound bed Cellular or Tissue Based Products Cellular or Tissue Based Product Type: - Kerecis #5 Hold Kerecis 01/15/22 Cellular or Tissue Based Product applied to wound bed, secured with steri-strips, cover with Adaptic or Mepitel. (DO NOT REMOVE). Off-Loading Other: - Try to not stand on your feet too much Wound Treatment Wound #2 - Foot Wound Laterality: Plantar, Right, Lateral Cleanser: Soap and Water Every Other Day/15 Days Discharge Instructions: May shower and wash wound with dial antibacterial soap and water prior to dressing change. Cleanser: Wound Cleanser (Generic) Every Other Day/15 Days Discharge Instructions: Cleanse the wound with wound cleanser prior to applying a clean dressing using gauze sponges, not tissue or cotton balls. Peri-Wound Care: cotton tipped applicators (Generic) Every Other Day/15 Days Prim Dressing: Iodoform packing strip 1/2 (in) Every Other Day/15 Days ary Discharge Instructions: Lightly pack as instructed Secondary Dressing: Woven Gauze Sponge, Non-Sterile 4x4 in Every Other Day/15 Days Discharge Instructions: Apply over primary dressing as directed. Secured With: Paper Tape, 2x10 (in/yd) (Generic) Every Other Day/15 Days Discharge Instructions: Secure dressing with tape as directed. Compression Wrap: Kerlix Roll 4.5x3.1 (in/yd) (Generic) Every Other Day/15 Days Discharge Instructions: Apply Kerlix and Coban compression as directed. Electronic Signature(s) Signed: 01/15/2022 8:36:05 AM By: [name] Maudlin MD FACS Entered By: [name] [name] on 01/15/2022 08:33:38 [name] [name], [name] [name] ([id number])  [id number] Page 5 of 9 -------------------------------------------------------------------------------- Problem List Details Patient Name: Date of Service: [name], [name] [name] 01/15/2022 8:00 A [name] Medical Record Number: [id number] Patient Account Number: [id number] Date of Birth/Sex: Treating RN: [birth year] 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 01/15/2022 8:00 A [name] Medical Record Number: [id number] Patient Account Number: [id number] Date of Birth/Sex: Treating RN: 13-Jul-1970 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 The most recent amputation was in August 2022. At that operation, antibiotic  beads were placed in the wound. He has been managed by podiatry for his procedures and management of his wounds. He has been in a Water engineer. He is on oral doxycycline. They have been using Betadine wet to dry dressings along with Iodosorb. The patient states that when he thinks the wound is getting too dry, he applies topical Neosporin. At his last visit, on June 7 of this year, the podiatrist determined that he felt the wound was stalled and referred him to wound care for additional evaluation and management. An MRI has been ordered, but not yet scheduled or performed. Pathology from his operation in August 2022 demonstrated findings consistent with chronic osteomyelitis. Today, there is a large irregular wound on the plantar surface of his right foot, at about the level of the fifth metatarsal head. This tracks through to a pinpoint opening on the dorsal lateral portion of his foot. The intake nurse reported purulent drainage. There is some malodor from the wound. No frank necrosis identified. 07/11/2021: Today, the wounds do not connect. I attempted multiple times from various directions and the shared tunnel is no longer open. He has some slough accumulation on the dorsal part of his foot as well as slough and callus buildup on the plantar surface. His MRI is scheduled for June 29. No purulent drainage or malodor appreciated today. [name] [name], [name] [name] ([id number]) [id number] Page 6 of 9 07/19/2021: The lateral foot wound has closed and there is no tunnel connecting the plantar foot wound to that site. The plantar foot wound still probes quite deeply, however. There is some slough, eschar, and nonviable tissue accumulated in the wound bed. No malodorous drainage present. His MRI was performed yesterday and is consistent with fairly extensive osteomyelitis. 07/29/2021: The patient has an appointment with infectious disease on July 18 to treat his  osteomyelitis. The plantar wound still probes quite deeply, approaching bone. The orifice has narrowed quite substantially, however, making it more difficult for him to pack. 08/05/2021: The tunnel connecting the lateral foot wound to the plantar foot wound has reopened. He sees infectious disease tomorrow to discuss long-term antibiotic treatment for osteomyelitis. There was a bit of murky drainage in the wound, but this was noted after he had had topical lidocaine applied so may have just been  a blob of the anesthetic. No odor or frank purulent drainage. The wound probes deeply at the midfoot approaching bone. He does have MRI results confirming his diagnosis of osteomyelitis. He has failed to progress with conventional treatment and I think his best chance for preservation of the foot is to initiate hyperbaric oxygen therapy. 08/13/2021: The tunnel connecting the 2 wounds has closed again. He has a PICC line and is getting IV daptomycin and cefepime. EKG and chest x-ray are within normal limits. He is scheduled to start hyperbaric oxygen therapy tomorrow. The wound in his midfoot probes deeply, approaching bone. The skin at the orifice continues to heaped up and threatens to close over despite the large cavity within. No erythema, induration, or purulent drainage. The wound on his lateral foot is fairly small and quite clean. 08/19/2021: The lateral foot wound has nearly closed. The wound in his midfoot does not probe quite as deeply today. The skin at the orifice continues to try to roll in and obscure the opening. No frankly necrotic tissue appreciated. He is tolerating hyperbaric oxygen therapy. 08/26/2021: The lateral foot wound has closed completely. The wound in his midfoot is shallower again today. The wound orifice is contracting. We are using gentamicin and silver alginate. He continues on IV daptomycin and cefepime and is tolerating hyperbaric oxygen therapy without difficulty. 09/02/2021: His  foot is a little bit sore today but he was up walking on it all weekend doing back to school shopping with his daughter. The tunneling is down to about 3 cm and I do not appreciate bone at the tip of the probe. The wound is clean but has some callus creating an overhanging lip at the distal aspect. He continues to receive hyperbaric oxygen therapy as well as IV daptomycin and cefepime. 09/09/2021: His wound continues to improve. The tunnel has come in by 0.9 cm. He continues to form callus around the orifice of the wound. He is tolerating hyperbaric oxygen therapy along with his IV antibiotics. 09/16/2021: The tunneling is down to just half a centimeter. He continues to build up callus around the orifice of the wound. He completed his course of IV antibiotics and his PICC line is scheduled to be removed this Friday. He is tolerating hyperbaric oxygen without difficulty. We have been applying topical gentamicin and silver alginate to his wound. 09/24/2021: The wound depth has come in even further. There is a little bit of callus buildup around the orifice. The opening is quite narrow, at this point. 09/30/2021: The wound depth is about the same this week. The opening continues to contract with callus accumulation. There is some discoloration of the skin on the lateral part of his foot and he admitted to walking more than usual this weekend and also wearing a different pair shoes. He continues to tolerate hyperbaric oxygen therapy without difficulty. 10/07/2021: The wound depth has contracted a bit and is now approximately 1 to 1.5 cm. The orifice of the wound continues to try and close in over the space. There is just some callus and skin around the margins obscuring the orifice. 10/14/2021: The wound depth has come in a little bit more. The orifice of the wound is rolling inwards with epithelium and callus, making it difficult to pack the wound. 10/21/2021: There is still 1 portion of the wound, at the  lateral aspect, that is still a couple of centimeters deep. The architecture of the wound makes this somewhat challenging to access and pack. Everything else looks clean.  He has his usual accumulation of callus and skin, narrowing the orifice. 10/29/2021: No significant change to the depth of the wound. The orifice continues to contract with callus and skin narrowing the opening. His wife has been packing the wound and doing an excellent job. 11/05/2021: The depth of the wound has come in by about a centimeter. There is less callus accumulation around the orificeoodespite this, the wound is narrower today. We are still awaiting insurance approval for Kerecis powder. 11/12/2021: The depth of the wound continues to contract. He has accumulated some callus around the orifice, as usual. We have received a verbal confirmation that he is approved for Kerecis, but no formal paperwork has yet been received. 11/19/2021: The depth continues to come in. There is callus accumulation around the orifice, as usual. He has been formally approved for Kerecis, but unfortunately we do not have a billing mechanism for the powder in [facility] yet. We are working to address this so we can begin treatment. 11/25/2021: Continued filling in of the depth of the wound. Continued buildup of callus around the orifice. No concern for infection. As we do not have a way to bill for Kerecis powder, but are capable of billing for the [facility] sheet, we are going to use that on him instead. 12/03/2021: The depth of the wound continues to fill in. As usual, he has accumulated callus and skin around the orifice of the wound. No concern for infection. He will complete his hyperbaric oxygen therapy this week. 12/17/2021: The wound depth continues to contract. There is callus and skin accumulation around the orifice of the wound, as per usual. There is more epithelium on the actual wound surface. Unfortunately, his Leroy Kennedy was not  ordered. 12/24/2021: The wound depth has come in a little bit more this week. There is less skin accumulation around the orifice, but still some callus. On questioning, he admits to spending a fair amount of time on his feet. 12/12; this is a very difficult wound to assess size slitlike wound with some depth but minimal opening. I applied Kerecis No. 4 12/18; Kerecis No. 5 01/15/2022: His wound measured deeper today. There is also some evidence of pressure induced tissue injury around the lateral aspect of his foot. The patient reports that he was wearing different shoes than usual and felt like his foot was rolling a lot over the weekend. There is heavy callus around the orifice of the wound. No concern for infection. Patient History Information obtained from Patient. Social History Never smoker, Marital Status - Married, Alcohol Use - Never, Drug Use - No History, Caffeine Use - Daily - T coffee. ea; Medical History Endocrine [name] [name], Natthew [name] ([id number]) [id number] Page 7 of 9 Patient has history of Type II Diabetes Hospitalization/Surgery History - Amuptation of 3rd,4th and 5th toes of Right foot;Oral Surgery;Anal Fissure surgery; Cholecystectomy. Objective Constitutional No acute distress. Vitals Time Taken: 8:01 AM, Height: 72 in, Weight: 312 lbs, BMI: 42.3, Respiratory Rate: 17 breaths/min. General Notes: 01/15/2022: His wound measured deeper today. There is also some evidence of pressure induced tissue injury around the lateral aspect of his foot. There is heavy callus around the orifice of the wound. No concern for infection. Integumentary (Hair, Skin) Wound #2 status is Open. Original cause of wound was Gradually Appeared. The date acquired was: 12/19/2016. The wound has been in treatment 27 weeks. The wound is located on the [place]. The wound measures 0.4cm length x 1.3cm width x 1.7cm depth; 0.408cm^2 area and 0.694cm^3  volume.  There is Fat Layer (Subcutaneous Tissue) exposed. There is no tunneling noted, however, there is undermining starting at 7:00 and ending at 4:00 with a maximum distance of 1cm. There is a medium amount of serosanguineous drainage noted. The wound margin is thickened. There is medium (34-66%) pink granulation within the wound bed. There is a medium (34-66%) amount of necrotic tissue within the wound bed. The periwound skin appearance had no abnormalities noted for moisture. The periwound skin appearance had no abnormalities noted for color. The periwound skin appearance exhibited: Callus. Periwound temperature was noted as No Abnormality. 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 There was a Selective/Open Wound Skin/Epidermis Debridement with a total area of 0.52 sq cm performed by [name] Maudlin, MD. With the following instrument(s): Curette to remove Viable and Non-Viable tissue/material. Material removed includes Callus, Skin: Dermis, and Skin: Epidermis after achieving pain control using Lidocaine. No specimens were taken. A time out was conducted at 08:17, prior to the start of the procedure. A Minimum amount of bleeding was controlled with Pressure. The procedure was tolerated well with a pain level of 0 throughout and a pain level of 0 following the procedure. Post Debridement Measurements: 0.4cm length x 1.3cm width x 1.7cm depth; 0.694cm^3 volume. Character of Wound/Ulcer Post Debridement is improved. Severity of Tissue Post Debridement is: Fat layer exposed. Post procedure Diagnosis Wound #2:  Same as Pre-Procedure Plan Follow-up Appointments: Return Appointment in 1 week. - Dr Celine Ahr Anesthetic: Wound #2 Right,Lateral,Plantar Foot: (In clinic) Topical Lidocaine 4% applied to wound bed Cellular or Tissue Based Products: Cellular or Tissue Based Product Type: - Kerecis #5 Hold Kerecis 01/15/22 Cellular or Tissue Based Product applied to wound bed, secured with steri-strips, cover with Adaptic or Mepitel. (DO NOT REMOVE). Off-Loading: Other: - Try to not stand on your feet too much WOUND #2: - Foot Wound Laterality: Plantar, Right, Lateral Cleanser: Soap and Water Every Other Day/15 Days Discharge Instructions: May shower and wash wound with dial antibacterial soap and water prior to dressing change. Cleanser: Wound Cleanser (Generic) Every Other Day/15 Days Discharge Instructions: Cleanse the wound with wound cleanser prior to applying a clean dressing using gauze sponges, not tissue or cotton balls. Peri-Wound Care: cotton tipped applicators (Generic) Every Other Day/15 Days [name] [name], [name] [name] ([id number]) [id number] Page 8 of 9 Prim Dressing: Iodoform packing strip 1/2 (in) Every Other Day/15 Days ary Discharge Instructions: Lightly pack as instructed Secondary Dressing: Woven Gauze Sponge, Non-Sterile 4x4 in Every Other Day/15 Days Discharge Instructions: Apply over primary dressing as directed. Secured With: Paper T ape, 2x10 (in/yd) (Generic) Every Other Day/15 Days Discharge Instructions: Secure dressing with tape as directed. Com pression Wrap: Kerlix Roll 4.5x3.1 (in/yd) (Generic) Every Other Day/15 Days Discharge Instructions: Apply Kerlix and Coban compression as directed. 01/15/2022: His wound measured deeper today. There is also some evidence of pressure induced tissue injury around the lateral aspect of his foot. The patient reports that he was wearing different shoes than usual and felt like his foot was rolling a lot over the weekend.  There is heavy callus around the orifice of the wound. No concern for infection. I used a curette to debride callus  and skin from the orifice of the wound. The patient reports that he is no longer wearing the shoes that seem to have caused the worsening of his injury. I am going to hold off on Kerecis application this week. We will go back to packing the wound with iodoform packing strips. Hopefully we will be able to resume the skin substitute at his visit in 1 week's time. Electronic Signature(s) Signed: 01/15/2022 8:34:25 AM By: [name] Maudlin MD FACS Entered By: [name] [name] on 01/15/2022 08:34:25 -------------------------------------------------------------------------------- HxROS Details Patient Name: Date of Service: [name], [name] [name] 01/15/2022 8:00 A [name] Medical Record Number: [id number] Patient Account Number: [id number] Date of Birth/Sex: Treating RN: [birth year] [name] Primary Care Provider: Garret [name] Other Clinician: Referring Provider: Treating Provider/Extender: [name] [name] in Treatment: 27 Information Obtained From Patient Endocrine Medical History: Positive for: Type II Diabetes Immunizations Pneumococcal Vaccine: Received Pneumococcal Vaccination: Yes Received Pneumococcal Vaccination On or After 60th Birthday: No Implantable Devices None Hospitalization / Surgery History Type of Hospitalization/Surgery Amuptation of 3rd,4th and 5th toes of Right foot;Oral Surgery;Anal Fissure surgery; Cholecystectomy Family and Social History Never smoker; Marital Status - Married; Alcohol Use: Never; Drug Use: No History; Caffeine Use: Daily - T coffee; Financial Concerns: No; Food, ea; Clothing or Shelter Needs: No; Support System Lacking: No; Transportation Concerns: No Electronic Signature(s) Signed: 01/15/2022 8:36:05 AM By: [name] Maudlin MD FACS Entered By: [name] [name] on 01/15/2022 08:32:53 [name] [name], [name] [name] ([id number])  [id number] Page 9 of 9 -------------------------------------------------------------------------------- SuperBill Details Patient Name: Date of Service: [name], [name] [name] 01/15/2022 Medical Record Number: [id number] Patient Account Number: [id number] Date of Birth/Sex: Treating RN: 1971-01-04 (51 y.o. [name] [name], [name] Primary Care Provider: Garret [name] Other Clinician: Referring Provider: Treating Provider/Extender: [name] [name] in Treatment: 27 Diagnosis Coding ICD-10 Codes Code Description E11.49 Type 2 diabetes mellitus with other diabetic neurological complication K93.818 Other chronic osteomyelitis, right ankle and foot L97.514 Non-pressure chronic ulcer of other part of right foot with necrosis of bone Facility Procedures : CPT4 Code: [id number] Description: 67893 - DEBRIDE WOUND 1ST 20 SQ CM OR < ICD-10 Diagnosis Description L97.514 Non-pressure chronic ulcer of other part of right foot with necrosis of bone Modifier: Quantity: 1 Physician Procedures : CPT4 Code Description Modifier [id number] - WC PHYS LEVEL 4 - EST PT 25 ICD-10 Diagnosis Description L97.514 Non-pressure chronic ulcer of other part of right foot with necrosis of bone E11.49 Type 2 diabetes mellitus with other diabetic  neurological complication I77.824 Other chronic osteomyelitis, right ankle and foot Quantity: 1 : [id number] - WC PHYS DEBR WO ANESTH 20 SQ CM ICD-10 Diagnosis Description L97.514 Non-pressure chronic ulcer of other part of right foot with necrosis of bone Quantity: 1 Electronic Signature(s) Signed: 01/15/2022 8:34:43 AM By: [name] Maudlin MD FACS Entered By: [name] [name] on 01/15/2022 08:34:42

## 2022-01-21 ENCOUNTER — Encounter (HOSPITAL_BASED_OUTPATIENT_CLINIC_OR_DEPARTMENT_OTHER): Payer: BLUE CROSS/BLUE SHIELD | Attending: General Surgery | Admitting: General Surgery

## 2022-01-21 DIAGNOSIS — E1149 Type 2 diabetes mellitus with other diabetic neurological complication: Secondary | ICD-10-CM | POA: Diagnosis not present

## 2022-01-21 DIAGNOSIS — M86671 Other chronic osteomyelitis, right ankle and foot: Secondary | ICD-10-CM | POA: Diagnosis not present

## 2022-01-21 DIAGNOSIS — Z89421 Acquired absence of other right toe(s): Secondary | ICD-10-CM | POA: Insufficient documentation

## 2022-01-21 DIAGNOSIS — E11621 Type 2 diabetes mellitus with foot ulcer: Secondary | ICD-10-CM | POA: Diagnosis not present

## 2022-01-21 DIAGNOSIS — L97514 Non-pressure chronic ulcer of other part of right foot with necrosis of bone: Secondary | ICD-10-CM | POA: Diagnosis not present

## 2022-01-21 NOTE — Progress Notes (Signed)
[name], [name] ([id number]) [id number] Page 1 of 10 Visit Report for 01/21/2022 Chief Complaint Document Details Patient Name: Date of Service: [name], Perla [name] 01/21/2022 7:45 [name] Medical Record Number: [id number] Patient Account Number: [id number] Date of Birth/Sex: Treating RN: 1970-10-22 (52 y.o. [name] Primary Care Provider: [name] Other Clinician: Referring Provider: Treating Provider/Extender: [name] in Treatment: 28 Information Obtained from: Patient Chief Complaint Patients presents for treatment of an open diabetic ulcer Electronic Signature(s) Signed: 01/21/2022 8:11:40 AM By: [name] MD FACS Entered By: [name] on 01/21/2022 08:11:40 -------------------------------------------------------------------------------- Cellular or Tissue Based Product Details Patient Name: Date of Service: [name], [name] 01/21/2022 7:45 [name] Medical Record Number: [id number] Patient Account Number: [id number] Date of Birth/Sex: Treating RN: 1970-07-21 (52 y.o. [name] Provider: [name] Other Clinician: Referring Provider: Treating Provider/Extender: [name] in Treatment: 28 Cellular or Tissue Based Product Type Wound #2 Right,Lateral,Plantar Foot Applied to: Performed By: Physician [name], MD Cellular or Tissue Based Product Type: Kerecis Omega3 Level of Consciousness (Pre-procedure): Awake and Alert Pre-procedure Verification/Time Out Yes - 08:07 Taken: Location: genitalia / hands / feet / multiple digits Wound Size (sq cm): 0.33 Product Size (sq cm): 21 Waste Size (sq cm): 10 Waste Reason: wound size Amount of Product Applied (sq cm): 11 Instrument Used: Curette, Forceps, Scissors Lot #: [id number] Order #: 6 Expiration Date: 03/10/2024 Fenestrated: No Reconstituted: Yes Solution Type: 10 Solution Amount: normal saline Lot #:  [id number] Solution Expiration Date: 05/19/2024 Secured: Yes Secured With: Steri-Strips Dressing Applied: Yes Primary Dressing: [employer] Procedural Pain: 0 Post Procedural Pain: 0 Response to Treatment: Procedure was tolerated well [name], [name] ([id number]) [id number] Page 2 of 10 Level of Consciousness (Post- Awake and Alert procedure): Post Procedure Diagnosis Same as Pre-procedure Notes Scribed for [name] by Blanche East, RN Electronic Signature(s) Signed: 01/21/2022 8:15:57 AM By: [name] MD FACS Signed: 01/21/2022 4:19:49 PM By: Blanche East RN Entered By: Blanche East on 01/21/2022 08:12:11 -------------------------------------------------------------------------------- Debridement Details Patient Name: Date of Service: [name], [name] 01/21/2022 7:45 [name] Medical Record Number: [id number] Patient Account Number: [id number] Date of Birth/Sex: Treating RN: 08/16/1970 (52 y.o. [name] Provider: [name] Other Clinician: Referring Provider: Treating Provider/Extender: [name] in Treatment: 28 Debridement Performed for Assessment: Wound #2 Right,Lateral,Plantar Foot Performed By: Physician [name], MD Debridement Type: Debridement Severity of Tissue Pre Debridement: Fat layer exposed Level of Consciousness (Pre-procedure): Awake and Alert Pre-procedure Verification/Time Out Yes - 08:03 Taken: Start Time: 08:04 Pain Control: Lidocaine 4% T opical Solution T Area Debrided (L x W): otal 0.3 (cm) x 1.1 (cm) = 0.33 (cm) Tissue and other material debrided: Non-Viable, Callus, Slough, Skin: Epidermis, Slough Level: Skin/Epidermis Debridement Description: Selective/Open Wound Instrument: Curette Bleeding: Minimum Hemostasis Achieved: Pressure Response to Treatment: Procedure was tolerated well Level of Consciousness (Post- Awake and Alert procedure): Post Debridement  Measurements of Total Wound Length: (cm) 0.3 Width: (cm) 1.1 Depth: (cm) 1 Volume: (cm) 0.259 Character of Wound/Ulcer Post Debridement: Requires Further Debridement Severity of Tissue Post Debridement: Fat layer exposed Post Procedure Diagnosis Same as Pre-procedure Notes Scribed for [name] by Blanche East, RN Electronic Signature(s) Signed: 01/21/2022 8:15:57 AM By: [name] MD FACS Signed: 01/21/2022 4:19:49 PM By: Blanche East RN Entered By: Blanche East on 01/21/2022 08:07:02 [name], [name] ([id number]) [id number] Page 3 of 10 -------------------------------------------------------------------------------- HPI Details Patient Name: Date of  Service: [name], [name] 01/21/2022 7:45 [name] Medical Record Number: [id number] Patient Account Number: [id number] Date of Birth/Sex: Treating RN: March 13, 1970 (52 y.o. 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 The most recent amputation was in August 2022. At that operation, antibiotic beads were placed in the wound. He has been managed by podiatry for his procedures and management of his wounds. He has been in a Water engineer. He is on oral doxycycline. They have been using Betadine wet to dry dressings along with Iodosorb. The patient states that when he thinks the wound is getting too dry, he applies topical Neosporin. At his last visit, on June 7 of this year, the podiatrist determined that he felt the wound was stalled and referred him to wound care for additional evaluation and management. An MRI has been ordered, but not yet  scheduled or performed. Pathology from his operation in August 2022 demonstrated findings consistent with chronic osteomyelitis. Today, there is a large irregular wound on the plantar surface of his right foot, at about the level of the fifth metatarsal head. This tracks through to a pinpoint opening on the dorsal lateral portion of his foot. The intake nurse reported purulent drainage. There is some malodor from the wound. No frank necrosis identified. 07/11/2021: Today, the wounds do not connect. I attempted multiple times from various directions and the shared tunnel is no longer open. He has some slough accumulation on the dorsal part of his foot as well as slough and callus buildup on the plantar surface. His MRI is scheduled for June 29. No purulent drainage or malodor appreciated today. 07/19/2021: The lateral foot wound has closed and there is no tunnel connecting the plantar foot wound to that site. The plantar foot wound still probes quite deeply, however. There is some slough, eschar, and nonviable tissue accumulated in the wound bed. No malodorous drainage present. His MRI was performed yesterday and is consistent with fairly extensive osteomyelitis. 07/29/2021: The patient has an appointment with infectious disease on July 18 to treat his osteomyelitis. The plantar wound still probes quite deeply, approaching bone. The orifice has narrowed quite substantially, however, making it more difficult for him to pack. 08/05/2021: The tunnel connecting the lateral foot wound to the plantar foot wound has reopened. He sees infectious disease tomorrow to discuss long-term antibiotic treatment for osteomyelitis. There was a bit of murky drainage in the wound, but this was noted after he had had topical lidocaine applied so may have just been a blob of the anesthetic. No odor or frank purulent drainage. The wound probes deeply at the midfoot approaching bone. He does have MRI results confirming his  diagnosis of osteomyelitis. He has failed to progress with conventional treatment and I think his best chance for preservation of the foot is to initiate hyperbaric oxygen therapy. 08/13/2021: The tunnel connecting the 2 wounds has closed again. He has a PICC line and is getting IV daptomycin and cefepime. EKG and chest x-ray are within normal limits. He is scheduled to start hyperbaric oxygen therapy tomorrow. The wound in his midfoot probes deeply, approaching bone. The skin at the orifice continues to heaped up and threatens to close over despite  the large cavity within. No erythema, induration, or purulent drainage. The wound on his lateral foot is fairly small and quite clean. 08/19/2021: The lateral foot wound has nearly closed. The wound in his midfoot does not probe quite as deeply today. The skin at the orifice continues to try to roll in and obscure the opening. No frankly necrotic tissue appreciated. He is tolerating hyperbaric oxygen therapy. 08/26/2021: The lateral foot wound has closed completely. The wound in his midfoot is shallower again today. The wound orifice is contracting. We are using gentamicin and silver alginate. He continues on IV daptomycin and cefepime and is tolerating hyperbaric oxygen therapy without difficulty. 09/02/2021: His foot is a little bit sore today but he was up walking on it all weekend doing back to school shopping with his daughter. The tunneling is down to about 3 cm and I do not appreciate bone at the tip of the probe. The wound is clean but has some callus creating an overhanging lip at the distal aspect. He continues to receive hyperbaric oxygen therapy as well as IV daptomycin and cefepime. 09/09/2021: His wound continues to improve. The tunnel has come in by 0.9 cm. He continues to form callus around the orifice of the wound. He is tolerating hyperbaric oxygen therapy along with his IV antibiotics. 09/16/2021: The tunneling is down to just half a  centimeter. He continues to build up callus around the orifice of the wound. He completed his course of IV antibiotics and his PICC line is scheduled to be removed this Friday. He is tolerating hyperbaric oxygen without difficulty. We have been applying topical gentamicin and silver alginate to his wound. 09/24/2021: The wound depth has come in even further. There is a little bit of callus buildup around the orifice. The opening is quite narrow, at this point. 09/30/2021: The wound depth is about the same this week. The opening continues to contract with callus accumulation. There is some discoloration of the skin on the lateral part of his foot and he admitted to walking more than usual this weekend and also wearing a different pair shoes. He continues to tolerate hyperbaric oxygen therapy without difficulty. 10/07/2021: The wound depth has contracted a bit and is now approximately 1 to 1.5 cm. The orifice of the wound continues to try and close in over the space. There is just some callus and skin around the margins obscuring the orifice. 10/14/2021: The wound depth has come in a little bit more. The orifice of the wound is rolling inwards with epithelium and callus, making it difficult to pack the wound. 10/21/2021: There is still 1 portion of the wound, at the lateral aspect, that is still a couple of centimeters deep. The architecture of the wound makes this somewhat challenging to access and pack. Everything else looks clean. He has his usual accumulation of callus and skin, narrowing the orifice. 10/29/2021: No significant change to the depth of the wound. The orifice continues to contract with callus and skin narrowing the opening. His wife has been packing the wound and doing an excellent job. [name], Antonio [name] ([id number]) [id number] Page 4 of 10 11/05/2021: The depth of the wound has come in by about a centimeter. There is less callus accumulation around the  orificedespite this, the wound is narrower today. We are still awaiting insurance approval for Kerecis powder. 11/12/2021: The depth of the wound continues to contract. He has accumulated some callus around the orifice, as usual. We have received a verbal confirmation that he  is approved for Kerecis, but no formal paperwork has yet been received. 11/19/2021: The depth continues to come in. There is callus accumulation around the orifice, as usual. He has been formally approved for Kerecis, but unfortunately we do not have a billing mechanism for the powder in [employer] yet. We are working to address this so we can begin treatment. 11/25/2021: Continued filling in of the depth of the wound. Continued buildup of callus around the orifice. No concern for infection. As we do not have a way to bill for Kerecis powder, but are capable of billing for the [facility] sheet, we are going to use that on him instead. 12/03/2021: The depth of the wound continues to fill in. As usual, he has accumulated callus and skin around the orifice of the wound. No concern for infection. He will complete his hyperbaric oxygen therapy this week. 12/17/2021: The wound depth continues to contract. There is callus and skin accumulation around the orifice of the wound, as per usual. There is more epithelium on the actual wound surface. Unfortunately, his Leroy Kennedy was not ordered. 12/24/2021: The wound depth has come in a little bit more this week. There is less skin accumulation around the orifice, but still some callus. On questioning, he admits to spending a fair amount of time on his feet. 12/12; this is a very difficult wound to assess size slitlike wound with some depth but minimal opening. I applied Kerecis No. 4 12/18; Kerecis No. 5 01/15/2022: His wound measured deeper today. There is also some evidence of pressure induced tissue injury around the lateral aspect of his foot. The patient reports that he was wearing different shoes  than usual and felt like his foot was rolling a lot over the weekend. There is heavy callus around the orifice of the wound. No concern for infection. 01/21/2022: The depth decreased today from 1.7 to 1.0 cm. Significantly less tissue damage as compared to last week. Still with callus and skin accumulation around the orifice. Electronic Signature(s) Signed: 01/21/2022 8:12:17 AM By: [name] MD FACS Entered By: [name] on 01/21/2022 08:12:16 -------------------------------------------------------------------------------- Physical Exam Details Patient Name: Date of Service: [name], [name] 01/21/2022 7:45 [name] Medical Record Number: [id number] Patient Account Number: [id number] Date of Birth/Sex: Treating RN: November 07, 1970 (52 y.o. [name] Primary Care Provider: [name] Other Clinician: Referring Provider: Treating Provider/Extender: [name] in Treatment: 28 Constitutional He is hypertensive, but asymptomatic.. . . . No acute distress. Respiratory Normal work of breathing on room air. Notes 01/21/2022: The depth decreased today from 1.7 to 1.0 cm. Significantly less tissue damage as compared to last week. Still with callus and skin accumulation around the orifice. Electronic Signature(s) Signed: 01/21/2022 8:13:14 AM By: [name] MD FACS Entered By: [name] on 01/21/2022 08:13:13 -------------------------------------------------------------------------------- Physician Orders Details Patient Name: Date of Service: [name], [name] 01/21/2022 7:45 [name] Medical Record Number: [id number] Patient Account Number: [id number] [name], Burney [name] ([id number]) [id number] Page 5 of 10 Date of Birth/Sex: Treating RN: 1970/05/18 (52 y.o. 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 - Dr Celine Ahr Return A  Anesthetic Wound #2 Right,Lateral,Plantar Foot (In clinic) Topical Lidocaine 4% applied to wound bed Cellular or Tissue Based Products Cellular or Tissue Based Product Type: - Kerecis #6 Cellular or Tissue Based Product applied to wound bed, secured with steri-strips, cover with Adaptic or Mepitel. (DO NOT REMOVE). Off-Loading Other: - Try to not stand on your feet too much Wound Treatment Wound #2 - Foot Wound Laterality: Plantar, Right, Lateral Cleanser: Soap and Water Every Other Day/15 Days Discharge Instructions: May shower and wash wound with dial antibacterial soap and water prior to dressing change. Cleanser: Wound Cleanser (Generic) Every Other Day/15 Days Discharge Instructions: Cleanse the wound with wound cleanser prior to applying a clean dressing using gauze sponges, not tissue or cotton balls. Peri-Wound Care: cotton tipped applicators (Generic) Every Other Day/15 Days Secondary Dressing: Woven Gauze Sponge, Non-Sterile 4x4 in Every Other Day/15 Days Discharge Instructions: Apply over primary dressing as directed. Secured With: Elastic Bandage 4 inch (ACE bandage) Every Other Day/15 Days Discharge Instructions: Secure with ACE bandage as directed. Secured With: Paper Tape, 2x10 (in/yd) (Generic) Every Other Day/15 Days Discharge Instructions: Secure dressing with tape as directed. Compression Wrap: Kerlix Roll 4.5x3.1 (in/yd) (Generic) Every Other Day/15 Days Discharge Instructions: Apply Kerlix and Coban compression as directed. Electronic Signature(s) Signed: 01/21/2022 8:15:57 AM By: [name] MD  FACS Entered By: [name] on 01/21/2022 08:13:30 -------------------------------------------------------------------------------- Problem List Details Patient Name: Date of Service: [name], [name] 01/21/2022 7:45 [name] Medical Record Number: [id number] Patient Account Number: [id number] Date of Birth/Sex: Treating RN: 11/11/1970 (52 y.o. 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 01/21/2022 7:45 [name] Medical Record Number: [id number] Patient Account Number: [id number] Date of Birth/Sex: Treating RN: 27-Jun-1970 (52 y.o. [name] Primary Care Provider: [name] Other Clinician: Referring Provider: Treating Provider/Extender: [name] in Treatment: 28 Subjective Chief Complaint Information obtained from Patient Patients presents for treatment of an open diabetic ulcer History of Present Illness (HPI) ADMISSION 07/04/2021 This is a 52 year old type II diabetic (last hemoglobin A1c 6.8%) who has had a number of diabetic foot infections, resulting in the amputation of right toes 3 through  5. The most recent amputation was in August 2022. At that operation, antibiotic beads were placed in the wound. He has been managed by podiatry for his procedures and management of his wounds. He has been in a Water engineer. He is on oral doxycycline. They have been using Betadine wet to dry dressings along with Iodosorb. The patient states that when he thinks the wound is getting too dry, he applies topical Neosporin. At his last visit, on June 7 of this year, the podiatrist determined that he felt the wound was stalled and referred him to wound care for additional evaluation and management. An MRI has been ordered, but not yet scheduled or performed. Pathology from his operation in  August 2022 demonstrated findings consistent with chronic osteomyelitis. Today, there is a large irregular wound on the plantar surface of his right foot, at about the level of the fifth metatarsal head. This tracks through to a pinpoint opening on the dorsal lateral portion of his foot. The intake nurse reported purulent drainage. There is some malodor from the wound. No frank necrosis identified. 07/11/2021: Today, the wounds do not connect. I attempted multiple times from various directions and the shared tunnel is no longer open. He has some slough accumulation on the dorsal part of his foot as well as slough and callus buildup on the plantar surface. His MRI is scheduled for June 29. No purulent drainage or malodor appreciated today. 07/19/2021: The lateral foot wound has closed and there is no tunnel connecting the plantar foot wound to that site. The plantar foot wound still probes quite deeply, however. There is some slough, eschar, and nonviable tissue accumulated in the wound bed. No malodorous drainage present. His MRI was performed yesterday and is consistent with fairly extensive osteomyelitis. 07/29/2021: The patient has an appointment with infectious disease on July 18 to treat his  osteomyelitis. The plantar wound still probes quite deeply, approaching bone. The orifice has narrowed quite substantially, however, making it more difficult for him to pack. 08/05/2021: The tunnel connecting the lateral foot wound to the plantar foot wound has reopened. He sees infectious disease tomorrow to discuss long-term antibiotic treatment for osteomyelitis. There was a bit of murky drainage in the wound, but this was noted after he had had topical lidocaine applied so may have just been a blob of the anesthetic. No odor or frank purulent drainage. The wound probes deeply at the midfoot approaching bone. He does have MRI results confirming his diagnosis of osteomyelitis. He has failed to progress with conventional treatment and I think his best chance for preservation of the foot [name], Newt [name] ([id number]) [id number] Page 7 of 10 is to initiate hyperbaric oxygen therapy. 08/13/2021: The tunnel connecting the 2 wounds has closed again. He has a PICC line and is getting IV daptomycin and cefepime. EKG and chest x-ray are within normal limits. He is scheduled to start hyperbaric oxygen therapy tomorrow. The wound in his midfoot probes deeply, approaching bone. The skin at the orifice continues to heaped up and threatens to close over despite the large cavity within. No erythema, induration, or purulent drainage. The wound on his lateral foot is fairly small and quite clean. 08/19/2021: The lateral foot wound has nearly closed. The wound in his midfoot does not probe quite as deeply today. The skin at the orifice continues to try to roll in and obscure the opening. No frankly necrotic tissue appreciated. He is tolerating hyperbaric oxygen therapy. 08/26/2021: The lateral foot wound has closed completely. The wound in his midfoot is shallower again today. The wound orifice is contracting. We are using gentamicin and silver alginate. He continues on IV daptomycin and  cefepime and is tolerating hyperbaric oxygen therapy without difficulty. 09/02/2021: His foot is a little bit sore today but he was up walking on it all weekend doing back to school shopping with his daughter. The tunneling is down to about 3 cm and I do not appreciate bone at the tip of the probe. The wound is clean but has some callus creating an overhanging lip at the distal aspect. He continues to receive hyperbaric oxygen therapy as well as IV daptomycin and cefepime. 09/09/2021: His wound continues to improve. The tunnel has  come in by 0.9 cm. He continues to form callus around the orifice of the wound. He is tolerating hyperbaric oxygen therapy along with his IV antibiotics. 09/16/2021: The tunneling is down to just half a centimeter. He continues to build up callus around the orifice of the wound. He completed his course of IV antibiotics and his PICC line is scheduled to be removed this Friday. He is tolerating hyperbaric oxygen without difficulty. We have been applying topical gentamicin and silver alginate to his wound. 09/24/2021: The wound depth has come in even further. There is a little bit of callus buildup around the orifice. The opening is quite narrow, at this point. 09/30/2021: The wound depth is about the same this week. The opening continues to contract with callus accumulation. There is some discoloration of the skin on the lateral part of his foot and he admitted to walking more than usual this weekend and also wearing a different pair shoes. He continues to tolerate hyperbaric oxygen therapy without difficulty. 10/07/2021: The wound depth has contracted a bit and is now approximately 1 to 1.5 cm. The orifice of the wound continues to try and close in over the space. There is just some callus and skin around the margins obscuring the orifice. 10/14/2021: The wound depth has come in a little bit more. The orifice of the wound is rolling inwards with epithelium and callus, making it  difficult to pack the wound. 10/21/2021: There is still 1 portion of the wound, at the lateral aspect, that is still a couple of centimeters deep. The architecture of the wound makes this somewhat challenging to access and pack. Everything else looks clean. He has his usual accumulation of callus and skin, narrowing the orifice. 10/29/2021: No significant change to the depth of the wound. The orifice continues to contract with callus and skin narrowing the opening. His wife has been packing the wound and doing an excellent job. 11/05/2021: The depth of the wound has come in by about a centimeter. There is less callus accumulation around the orificeoodespite this, the wound is narrower today. We are still awaiting insurance approval for Kerecis powder. 11/12/2021: The depth of the wound continues to contract. He has accumulated some callus around the orifice, as usual. We have received a verbal confirmation that he is approved for Kerecis, but no formal paperwork has yet been received. 11/19/2021: The depth continues to come in. There is callus accumulation around the orifice, as usual. He has been formally approved for Kerecis, but unfortunately we do not have a billing mechanism for the powder in [facility] yet. We are working to address this so we can begin treatment. 11/25/2021: Continued filling in of the depth of the wound. Continued buildup of callus around the orifice. No concern for infection. As we do not have a way to bill for Kerecis powder, but are capable of billing for the [facility] sheet, we are going to use that on him instead. 12/03/2021: The depth of the wound continues to fill in. As usual, he has accumulated callus and skin around the orifice of the wound. No concern for infection. He will complete his hyperbaric oxygen therapy this week. 12/17/2021: The wound depth continues to contract. There is callus and skin accumulation around the orifice of the wound, as per usual. There is  more epithelium on the actual wound surface. Unfortunately, his Leroy Kennedy was not ordered. 12/24/2021: The wound depth has come in a little bit more this week. There is less skin accumulation around the  orifice, but still some callus. On questioning, he admits to spending a fair amount of time on his feet. 12/12; this is a very difficult wound to assess size slitlike wound with some depth but minimal opening. I applied Kerecis No. 4 12/18; Kerecis No. 5 01/15/2022: His wound measured deeper today. There is also some evidence of pressure induced tissue injury around the lateral aspect of his foot. The patient reports that he was wearing different shoes than usual and felt like his foot was rolling a lot over the weekend. There is heavy callus around the orifice of the wound. No concern for infection. 01/21/2022: The depth decreased today from 1.7 to 1.0 cm. Significantly less tissue damage as compared to last week. Still with callus and skin accumulation around the orifice. Patient History Information obtained from Patient. Social History Never smoker, Marital Status - Married, Alcohol Use - Never, Drug Use - No History, Caffeine Use - Daily - T coffee. ea; Medical History Endocrine Patient has history of Type II Diabetes Hospitalization/Surgery History - Amuptation of 3rd,4th and 5th toes of Right foot;Oral Surgery;Anal Fissure surgery; Cholecystectomy. [name], [name] ([id number]) [id number] Page 8 of 10 Objective Constitutional He is hypertensive, but asymptomatic.Marland Kitchen No acute distress. Vitals Time Taken: 7:50 AM, Height: 72 in, Weight: 312 lbs, BMI: 42.3, Temperature: 98.3 F, Pulse: 75 bpm, Respiratory Rate: 16 breaths/min, Blood Pressure: 159/91 mmHg, Capillary Blood Glucose: 171 mg/dl. Respiratory Normal work of breathing on room air. General Notes: 01/21/2022: The depth decreased today from 1.7 to 1.0 cm. Significantly less tissue damage as compared to last  week. Still with callus and skin accumulation around the orifice. Integumentary (Hair, Skin) Wound #2 status is Open. Original cause of wound was Gradually Appeared. The date acquired was: 12/19/2016. The wound has been in treatment 28 weeks. The wound is located on the [place]. The wound measures 0.3cm length x 1.1cm width x 1cm depth; 0.259cm^2 area and 0.259cm^3 volume. There is Fat Layer (Subcutaneous Tissue) exposed. There is no tunneling noted, however, there is undermining starting at 9:00 and ending at 6:00 with a maximum distance of 0.4cm. There is a medium amount of serosanguineous drainage noted. The wound margin is thickened. There is medium (34-66%) pink granulation within the wound bed. There is a medium (34-66%) amount of necrotic tissue within the wound bed. The periwound skin appearance had no abnormalities noted for moisture. The periwound skin appearance had no abnormalities noted for color. The periwound skin appearance exhibited: Callus. Periwound temperature was noted as No Abnormality. 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 There was a Selective/Open Wound Skin/Epidermis Debridement with a total area of 0.33 sq cm performed by [name], MD. With the following instrument(s): Curette to remove Non-Viable tissue/material. Material removed includes Callus, Slough, and Skin: Epidermis after achieving pain control using Lidocaine 4% Topical Solution. No specimens were taken. A time out was conducted at 08:03, prior to the start of the procedure. A Minimum  amount of bleeding was controlled with Pressure. The procedure was tolerated well. Post Debridement Measurements: 0.3cm length x 1.1cm width x 1cm depth; 0.259cm^3 volume. Character of Wound/Ulcer Post Debridement requires further debridement. Severity of Tissue Post Debridement  is: Fat layer exposed. Post procedure Diagnosis Wound #2: Same as Pre-Procedure General Notes: Scribed for [name] by Blanche East, RN. Pre-procedure diagnosis of Wound #2 is a Diabetic Wound/Ulcer of the Lower Extremity located on the Right,Lateral,Plantar Foot. A skin graft procedure using a bioengineered skin substitute/cellular or tissue based product was performed by [name], MD with the following instrument(s): Curette, Forceps, and Scissors. Kerecis Omega3 was applied and secured with Steri-Strips. 11 sq cm of product was utilized and 10 sq cm was wasted due to wound size. Post Application, Adaptec was applied. A Time Out was conducted at 08:07, prior to the start of the procedure. The procedure was tolerated well with a pain level of 0 throughout and a pain level of 0 following the procedure. Post procedure Diagnosis Wound #2: Same as Pre-Procedure General Notes: Scribed for [name] by Blanche East, RN. Plan Follow-up Appointments: Return Appointment in 1 week. - Dr Celine Ahr Anesthetic: Wound #2 Right,Lateral,Plantar Foot: (In clinic) Topical Lidocaine 4% applied to wound bed Cellular or Tissue Based Products: Cellular or Tissue Based Product Type: - Kerecis #6 Cellular or Tissue Based Product applied to wound bed, secured with steri-strips, cover with Adaptic or Mepitel. (DO NOT REMOVE). Off-Loading: Other: - Try to not stand on your feet too much WOUND #2: - Foot Wound Laterality: Plantar, Right, Lateral Cleanser: Soap and Water Every Other Day/15 Days Discharge Instructions: May shower and wash wound with dial antibacterial soap and water prior to dressing change. [name], [name] ([id number])  [id number] Page 9 of 10 Cleanser: Wound Cleanser (Generic) Every Other Day/15 Days Discharge Instructions: Cleanse the wound with wound cleanser prior to applying a clean dressing using gauze sponges, not tissue or cotton balls. Peri-Wound Care: cotton tipped applicators (Generic) Every Other Day/15 Days Secondary Dressing: Woven Gauze Sponge, Non-Sterile 4x4 in Every Other Day/15 Days Discharge Instructions: Apply over primary dressing as directed. Secured With: Elastic Bandage 4 inch (ACE bandage) Every Other Day/15 Days Discharge Instructions: Secure with ACE bandage as directed. Secured With: Paper T ape, 2x10 (in/yd) (Generic) Every Other Day/15 Days Discharge Instructions: Secure dressing with tape as directed. Com pression Wrap: Kerlix Roll 4.5x3.1 (in/yd) (Generic) Every Other Day/15 Days Discharge Instructions: Apply Kerlix and Coban compression as directed. 01/21/2022: The depth decreased today from 1.7 to 1.0 cm. Significantly less tissue damage as compared to last week. Still with callus and skin accumulation around the orifice. I used a curette to debride callus, slough, and epidermis from the wound. I then applied Kerecis in standard fashion, moistening it with saline and packing it into the wound. It was secured with Adaptic and Steri-Strips. He will continue to offload as best as possible. Follow-up in 1 week. Electronic Signature(s) Signed: 01/21/2022 8:14:19 AM By: [name] MD FACS Entered By: [name] on 01/21/2022 08:14:19 -------------------------------------------------------------------------------- HxROS Details Patient Name: Date of Service: [place], [name] 01/21/2022 7:45 [name] Medical Record Number: [id number] Patient Account Number: [id number] Date of Birth/Sex: Treating RN: September 27, 1970 (52 y.o. [name] Primary Care Provider: [name] Other Clinician: Referring Provider: Treating Provider/Extender: [name] in Treatment: 28 Information Obtained From Patient Endocrine Medical History: Positive for: Type II Diabetes Immunizations Pneumococcal Vaccine: Received Pneumococcal Vaccination: Yes Received Pneumococcal Vaccination On or After 60th Birthday: No Implantable Devices None Hospitalization / Surgery History Type of Hospitalization/Surgery Amuptation of 3rd,4th and 5th toes of Right foot;Oral Surgery;Anal Fissure surgery; Cholecystectomy Family and Social History Never smoker; Marital Status - Married; Alcohol Use: Never;  Drug Use: No History; Caffeine Use: Daily - T coffee; Financial Concerns: No; Food, ea; Clothing or Shelter Needs: No; Support System Lacking: No; Transportation Concerns: No Electronic Signature(s) Signed: 01/21/2022 8:15:57 AM By: [name] MD FACS Entered By: [name] on 01/21/2022 08:12:21 [name], Ruben [name] ([id number]) [id number] Page 10 of 10 -------------------------------------------------------------------------------- SuperBill Details Patient Name: Date of Service: [name], [name] 01/21/2022 Medical Record Number: [id number] Patient Account Number: [id number] Date of Birth/Sex: Treating RN: 1970/04/29 (52 y.o. [name] Primary Care Provider: [name] Other Clinician: Referring Provider: Treating Provider/Extender: [name] in Treatment: 28 Diagnosis Coding ICD-10 Codes Code Description [phone] Non-pressure chronic ulcer of other part of right foot with necrosis of bone E11.49 Type 2 diabetes mellitus with other diabetic neurological complication T24.580 Other chronic osteomyelitis, right ankle and foot Facility Procedures : CPT4 Code: [id number] Description: K5397 Kerecis Omega Marigen 3 x 7 qty 21 Modifier: Quantity: 21 : CPT4 Code: [id number] Description: 90240 - SKIN SUB GRAFT FACE/NK/HF/G ICD-10 Diagnosis Description L97.514 Non-pressure  chronic ulcer of other part of right foot with necrosis of bone Modifier: Quantity: 1 Physician Procedures : CPT4 Code Description Modifier [id number] - WC PHYS LEVEL 4 - EST PT 25 ICD-10 Diagnosis Description L97.514 Non-pressure chronic ulcer of other part of right foot with necrosis of bone E11.49 Type 2 diabetes mellitus with other diabetic  neurological complication S34.196 Other chronic osteomyelitis, right ankle and foot Quantity: 1 : [id number] - WC PHYS SKIN SUB GRAFT FACE/NK/HF/G ICD-10 Diagnosis Description L97.514 Non-pressure chronic ulcer of other part of right foot with necrosis of bone Quantity: 1 Electronic Signature(s) Signed: 01/21/2022 9:31:43 AM By: [name] MD FACS Signed: 01/21/2022 4:19:49 PM By: Blanche East RN Previous Signature: 01/21/2022 8:15:01 AM Version By: [name] MD FACS Entered By: Blanche East on 01/21/2022 08:23:55

## 2022-01-21 NOTE — Progress Notes (Signed)
[name], Hyrum M ([id number]) [id number] Page 1 of 7 Visit Report for 01/21/2022 Arrival Information Details Patient Name: Date of Service: [place], [place] [name] 01/21/2022 7:45 A M Medical Record Number: [id number] Patient Account Number: [id number] Date of Birth/Sex: Treating RN: [birth year] (52 y.o. [name] Daylon Lafavor: [name] Other Clinician: Referring Antonetta Clanton: Treating Harlym Gehling/Extender: [name] in Treatment: 63 Visit Information History Since Last Visit Added or deleted any medications: No Patient Arrived: Ambulatory Any new allergies or adverse reactions: No Arrival Time: 07:53 Had a fall or experienced change in No Accompanied By: self activities of daily living that may affect Transfer Assistance: None risk of falls: Patient Requires Transmission-Based Precautions: No Signs or symptoms of abuse/neglect since last visito No Patient Has Alerts: Yes Hospitalized since last visit: No Implantable device outside of the clinic excluding No cellular tissue based products placed in the center since last visit: Has Dressing in Place as Prescribed: Yes Pain Present Now: No Electronic Signature(s) Signed: 01/21/2022 4:19:49 PM By: Blanche East RN Entered By: Blanche East on 01/21/2022 07:54:05 -------------------------------------------------------------------------------- Encounter Discharge Information Details Patient Name: Date of Service: [place], [name] 01/21/2022 7:45 A M Medical Record Number: [id number] Patient Account Number: [id number] Date of Birth/Sex: Treating RN: January 24, 1970 (52 y.o. [name] Tammara Massing: [name] Other Clinician: Referring Maxine Huynh: Treating Kevina Piloto/Extender: [name] in Treatment: 28 Encounter Discharge Information Items Post Procedure Vitals Discharge Condition: Stable Temperature (F): 98.3 Ambulatory Status:  Ambulatory Pulse (bpm): 75 Discharge Destination: Home Respiratory Rate (breaths/min): 16 Transportation: Other Blood Pressure (mmHg): 159/91 Accompanied By: self Schedule Follow-up Appointment: Yes Clinical Summary of Care: Electronic Signature(s) Signed: 01/21/2022 4:19:49 PM By: Blanche East RN Entered By: Blanche East on 01/21/2022 08:14:44 [name], Otha M ([id number]) [id number] Page 2 of 7 -------------------------------------------------------------------------------- Lower Extremity Assessment Details Patient Name: Date of Service: [name], [name] 01/21/2022 7:45 A M Medical Record Number: [id number] Patient Account Number: [id number] Date of Birth/Sex: Treating RN: May 14, 1970 (52 y.o. [name] Nobuko Gsell: [name] Other Clinician: Referring Meleny Tregoning: Treating Laqueshia Cihlar/Extender: [name] in Treatment: 28 Edema Assessment Assessed: [name]: No] [Right: No] Edema: [Left: N] [Right: o] Calf Left: Right: Point of Measurement: From Medial Instep 39.4 cm Ankle Left: Right: Point of Measurement: From Medial Instep 23.5 cm Vascular Assessment Pulses: Dorsalis Pedis Palpable: [Right:Yes] Electronic Signature(s) Signed: 01/21/2022 4:19:49 PM By: Blanche East RN Entered By: Blanche East on 01/21/2022 07:54:56 -------------------------------------------------------------------------------- Multi Wound Chart Details Patient Name: Date of Service: [name], [name] 01/21/2022 7:45 A M Medical Record Number: [id number] Patient Account Number: [id number] Date of Birth/Sex: Treating RN: [birth year] (52 y.o. M) Primary Care Camilah Spillman: [name] Other Clinician: Referring Miaya Lafontant: Treating Charitie Hinote/Extender: [name] in Treatment: 28 Vital Signs Height(in): 72 Capillary Blood Glucose(mg/dl): 171 Weight(lbs): 312 Pulse(bpm): 33 Body Mass Index(BMI): 42.3 Blood  Pressure(mmHg): 159/91 Temperature(F): 98.3 Respiratory Rate(breaths/min): 16 [2:Photos:] [N/A:N/A] Right, Lateral, Plantar Foot N/A N/A Wound Location: Gradually Appeared N/A N/A Wounding Event: Diabetic Wound/Ulcer of the Lower N/A N/A Primary Etiology: Extremity Type II Diabetes N/A N/A Comorbid History: 12/19/2016 N/A N/A Date Acquired: 71 N/A N/A Weeks of Treatment: Open N/A N/A Wound Status: No N/A N/A Wound Recurrence: 0.3x1.1x1 N/A N/A Measurements L x W x D (cm) 0.259 N/A N/A A (cm) : rea 0.259 N/A N/A Volume (cm) : 92.80% N/A N/A % Reduction in A rea: 98.60% N/A N/A % Reduction in  Volume: 9 Starting Position 1 (o'clock): 6 Ending Position 1 (o'clock): 0.4 Maximum Distance 1 (cm): Yes N/A N/A Undermining: Grade 3 N/A N/A Classification: Medium N/A N/A Exudate A mount: Serosanguineous N/A N/A Exudate Type: red, brown N/A N/A Exudate Color: Thickened N/A N/A Wound Margin: Medium (34-66%) N/A N/A Granulation A mount: Pink N/A N/A Granulation Quality: Medium (34-66%) N/A N/A Necrotic A mount: Fat Layer (Subcutaneous Tissue): Yes N/A N/A Exposed Structures: Fascia: No Tendon: No Muscle: No Joint: No Bone: No Small (1-33%) N/A N/A Epithelialization: Debridement - Selective/Open Wound N/A N/A Debridement: Pre-procedure Verification/Time Out 08:03 N/A N/A Taken: Lidocaine 4% T opical Solution N/A N/A Pain Control: Callus, Slough N/A N/A Tissue Debrided: Skin/Epidermis N/A N/A Level: 0.33 N/A N/A Debridement A (sq cm): rea Curette N/A N/A Instrument: Minimum N/A N/A Bleeding: Pressure N/A N/A Hemostasis A chieved: Procedure was tolerated well N/A N/A Debridement Treatment Response: 0.3x1.1x1 N/A N/A Post Debridement Measurements L x W x D (cm) 0.259 N/A N/A Post Debridement Volume: (cm) Callus: Yes N/A N/A Periwound Skin Texture: No Abnormalities Noted N/A N/A Periwound Skin Moisture: No Abnormalities Noted N/A  N/A Periwound Skin Color: No Abnormality N/A N/A Temperature: Debridement N/A N/A Procedures Performed: Treatment Notes Electronic Signature(s) Signed: 01/21/2022 8:11:32 AM By: [name] MD FACS Entered By: [name] on 01/21/2022 08:11:32 -------------------------------------------------------------------------------- Multi-Disciplinary Care Plan Details Patient Name: Date of Service: [place], [name] 01/21/2022 7:45 A M Medical Record Number: [id number] Patient Account Number: [id number] Date of Birth/Sex: Treating RN: Apr 30, 1970 (52 y.o. [name] Renesmay Nesbitt: [name] Other Clinician: Referring Efrat Zuidema: Treating Ryliegh Mcduffey/Extender: [name] in Treatment: [place] reviewed with physician [address] [name], [place] ([id number]) [id number] Page 4 of 7 Nutrition Nursing Diagnoses: Impaired glucose control: actual or potential Potential for alteratiion in Nutrition/Potential for imbalanced nutrition Goals: Patient/caregiver will maintain therapeutic glucose control Date Initiated: 07/29/2021 Target Resolution Date: 02/21/2022 Goal Status: Active Interventions: Assess patient nutrition upon admission and as needed per policy Provide education on elevated blood sugars and impact on wound healing Treatment Activities: Patient referred to Primary Care Physician for further nutritional evaluation : 07/29/2021 Notes: Osteomyelitis Nursing Diagnoses: Infection: osteomyelitis Knowledge deficit related to disease process and management Goals: Patient/caregiver will verbalize understanding of disease process and disease management Date Initiated: 07/29/2021 Date Inactivated: 11/12/2021 Target Resolution Date: 11/19/2021 Goal Status: Met Patient's osteomyelitis will resolve Date Initiated: 07/29/2021 Target Resolution Date: 02/21/2022 Goal Status:  Active Interventions: Assess for signs and symptoms of osteomyelitis resolution every visit Provide education on osteomyelitis Treatment Activities: Consult for HBO : 07/29/2021 Notes: Electronic Signature(s) Signed: 01/21/2022 4:19:49 PM By: Blanche East RN Entered By: Blanche East on 01/21/2022 08:12:59 -------------------------------------------------------------------------------- Pain Assessment Details Patient Name: Date of Service: [place], [name] 01/21/2022 7:45 A M Medical Record Number: [id number] Patient Account Number: [id number] Date of Birth/Sex: Treating RN: 05/27/1970 (52 y.o. [name] Milfred Krammes: [name] Other Clinician: Referring Joe Gee: Treating Gerson Fauth/Extender: [name] in Treatment: 28 Active Problems Location of Pain Severity and Description of Pain Patient Has Paino No Site Locations Rate the pain. [name], Kaniel M ([id number]) [id number] Page 5 of 7 Rate the pain. Current Pain Level: 0 Pain Management and Medication Current Pain Management: Electronic Signature(s) Signed: 01/21/2022 4:19:49 PM By: Blanche East RN Entered By: Blanche East on 01/21/2022 07:54:45 -------------------------------------------------------------------------------- Patient/Caregiver Education Details Patient Name: Date of Service: [place], [name] 1/2/2024andnbsp7:45 A M Medical Record Number: [id number]  Patient Account Number: [id number] Date of Birth/Gender: Treating RN: 01-16-1971 (52 y.o. [name] Physician: [name] Other Clinician: Referring Physician: Treating Physician/Extender: [name] in Treatment: 28 Education Assessment Education Provided To: Patient Education Topics Provided Elevated Blood Sugar/ Impact on Healing: Methods: Explain/Verbal Responses: Reinforcements needed, State content correctly Wound  Debridement: Methods: Explain/Verbal Responses: Reinforcements needed, State content correctly Wound/Skin Impairment: Methods: Explain/Verbal Responses: Reinforcements needed, State content correctly Electronic Signature(s) Signed: 01/21/2022 4:19:49 PM By: Blanche East RN Entered By: Blanche East on 01/21/2022 08:13:30 [name], Waylyn M ([id number]) [id number] Page 6 of 7 -------------------------------------------------------------------------------- Wound Assessment Details Patient Name: Date of Service: [name], [name] 01/21/2022 7:45 A M Medical Record Number: [id number] Patient Account Number: [id number] Date of Birth/Sex: Treating RN: 02/24/1970 (52 y.o. [name] Raeshaun Simson: [name] Other Clinician: Referring Maribeth Jiles: Treating Aleks Nawrot/Extender: [name] in Treatment: 28 Wound Status Wound Number: 2 Primary Etiology: Diabetic Wound/Ulcer of the Lower Extremity Wound Location: Right, Lateral, Plantar Foot Wound Status: Open Wounding Event: Gradually Appeared Comorbid History: Type II Diabetes Date Acquired: 12/19/2016 Weeks Of Treatment: 28 Clustered Wound: No Photos Wound Measurements Length: (cm) 0.3 Width: (cm) 1.1 Depth: (cm) 1 Area: (cm) 0.259 Volume: (cm) 0.259 % Reduction in Area: 92.8% % Reduction in Volume: 98.6% Epithelialization: Small (1-33%) Tunneling: No Undermining: Yes Starting Position (o'clock): 9 Ending Position (o'clock): 6 Maximum Distance: (cm) 0.4 Wound Description Classification: Grade 3 Wound Margin: Thickened Exudate Amount: Medium Exudate Type: Serosanguineous Exudate Color: red, brown Foul Odor After Cleansing: No Slough/Fibrino Yes Wound Bed Granulation Amount: Medium (34-66%) Exposed Structure Granulation Quality: Pink Fascia Exposed: No Necrotic Amount: Medium (34-66%) Fat Layer (Subcutaneous Tissue) Exposed: Yes Tendon Exposed:  No Muscle Exposed: No Joint Exposed: No Bone Exposed: No Periwound Skin Texture Texture Color No Abnormalities Noted: No No Abnormalities Noted: Yes Callus: Yes Temperature / Pain Temperature: No Abnormality Moisture No Abnormalities Noted: Yes Treatment Notes [name], Hersey M ([id number]) [id number] Page 7 of 7 Wound #2 (Foot) Wound Laterality: Plantar, Right, Lateral Cleanser Soap and Water Discharge Instruction: May shower and wash wound with dial antibacterial soap and water prior to dressing change. Wound Cleanser Discharge Instruction: Cleanse the wound with wound cleanser prior to applying a clean dressing using gauze sponges, not tissue or cotton balls. Peri-Wound Care cotton tipped applicators Topical Primary Dressing Secondary Dressing Woven Gauze Sponge, Non-Sterile 4x4 in Discharge Instruction: Apply over primary dressing as directed. Secured With Elastic Bandage 4 inch (ACE bandage) Discharge Instruction: Secure with ACE bandage as directed. Paper Tape, 2x10 (in/yd) Discharge Instruction: Secure dressing with tape as directed. Compression Wrap Kerlix Roll 4.5x3.1 (in/yd) Discharge Instruction: Apply Kerlix and Coban compression as directed. Compression Stockings Add-Ons Electronic Signature(s) Signed: 01/21/2022 4:19:49 PM By: Blanche East RN Entered By: Blanche East on 01/21/2022 07:58:47 -------------------------------------------------------------------------------- Vitals Details Patient Name: Date of Service: [place], [name] 01/21/2022 7:45 A M Medical Record Number: [id number] Patient Account Number: [id number] Date of Birth/Sex: Treating RN: Sep 07, 1970 (52 y.o. [name] Zavon Hyson: [name] Other Clinician: Referring Starlette Thurow: Treating Sabel Hornbeck/Extender: [name] in Treatment: 28 Vital Signs Time Taken: 07:50 Temperature (F): 98.3 Height (in): 72 Pulse (bpm):  75 Weight (lbs): 312 Respiratory Rate (breaths/min): 16 Body Mass Index (BMI): 42.3 Blood Pressure (mmHg): 159/91 Capillary Blood Glucose (mg/dl): 171 Reference Range: 80 - 120 mg / dl Electronic Signature(s) Signed: 01/21/2022 4:19:49 PM By: Blanche East RN Entered By: Blanche East  on 01/21/2022 07:54:27

## 2022-01-27 ENCOUNTER — Ambulatory Visit: Payer: BLUE CROSS/BLUE SHIELD | Admitting: Internal Medicine

## 2022-01-27 ENCOUNTER — Encounter (HOSPITAL_BASED_OUTPATIENT_CLINIC_OR_DEPARTMENT_OTHER): Payer: BLUE CROSS/BLUE SHIELD | Admitting: General Surgery

## 2022-01-29 ENCOUNTER — Encounter (HOSPITAL_BASED_OUTPATIENT_CLINIC_OR_DEPARTMENT_OTHER): Payer: BLUE CROSS/BLUE SHIELD | Admitting: General Surgery

## 2022-01-29 DIAGNOSIS — E11621 Type 2 diabetes mellitus with foot ulcer: Secondary | ICD-10-CM | POA: Diagnosis not present

## 2022-01-30 ENCOUNTER — Encounter: Payer: Self-pay | Admitting: Internal Medicine

## 2022-01-30 ENCOUNTER — Other Ambulatory Visit: Payer: Self-pay

## 2022-01-30 ENCOUNTER — Ambulatory Visit: Payer: BLUE CROSS/BLUE SHIELD | Admitting: Internal Medicine

## 2022-01-30 VITALS — BP 131/84 | HR 74 | Temp 98.6°F | Resp 16 | Ht 72.0 in | Wt 314.4 lb

## 2022-01-30 DIAGNOSIS — M86171 Other acute osteomyelitis, right ankle and foot: Secondary | ICD-10-CM

## 2022-01-30 NOTE — Assessment & Plan Note (Addendum)
Patient has been off antibiotics after finishing his last bottle of Augmentin about 2 weeks ago.  His wound has continued to improve as he has continued with his diligent wound care.  At this time, after a protracted antibiotic course, I am unsure further therapy will yield much in terms of benefit.  Since his wound continues to improve will advise to monitor off antibiotics and continue with wound care.  Will remain available as needed but I am hopeful this extended course has been enough to eradicate his infection.

## 2022-01-30 NOTE — Progress Notes (Signed)
[facility]  CHIEF COMPLAINT:    Follow up for osteomyelitis, diabetic foot wound  SUBJECTIVE:    [name] is a 52 y.o. male with PMHx as below who presents to the clinic for OM and diabetic foot wound.   Here today for planned follow up.  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  Discussed limb salvage vs amputation and patient wanted to be as aggressive as possible to salvage limb.  He has completed his HBO therapy and has now just been doing weekly wound care with most recent wound care note reviewable from 01/21/22.   He has completed an aggressive antibiotic course consisting of: -- Cefepime and Daptomycin IV via PICC line 08/09/21 through 09/20/21 (6 weeks) -- Linezolid and Levaquin PO from 09/21/21 - 11/03/21 (6 weeks) -- Augmentin "suppression" starting 11/05/21 - approximately 01/16/22   Of note, patient had to be admitted in October after monitoring labs showed significant drop in hemoglobin.  His numbers improved and he had GI work up that was negative for bleed.  It is suspected that he had linezolid induced anemia.  His hemoglobin has improved and most recently was 11.0 on 12/03/21.  His inflammatory markers in November noted with ESR 25, CRP 13.6.    Wound care note reviewed from 12/27 with increased wound depth and pressure injury in setting of wearing different shoes and rolling his foot on Christmas Eve.  Fortunately, this was improved on 01/21/22 with depth improving from 1.7 to 1.0cm and he reports there was further improvement noted this week with wound getting more and more shallow.  He reports that when he finished the bottle of Augmentin about 2 weeks ago that he has been off antibiotics since that time.    Please see A&P for the details of today's visit and status of the patient's medical problems.   Patient's Medications  New Prescriptions   No  medications on file  Previous Medications   ASCORBIC ACID (VITAMIN C) 500 MG TABLET    Take 500 mg by mouth daily.   ASPIRIN EC 81 MG TABLET    Take 81 mg by mouth daily.   ATORVASTATIN (LIPITOR) 80 MG TABLET    Take 1 tablet (80 mg total) by mouth daily.   CYANOCOBALAMIN (VITAMIN B-12) 5000 MCG SUBL    Place 5,000 mcg under the tongue daily.   FENOFIBRATE (TRICOR) 48 MG TABLET    TAKE 1 TABLET BY MOUTH EVERY DAY   GLUCOSE BLOOD (ACCU-CHEK GUIDE) TEST STRIP    Dx E11.49 - Use to check blood glucose three times daily or as directed.   HYDROCHLOROTHIAZIDE (MICROZIDE) 12.5 MG CAPSULE    TAKE 1 CAPSULE BY MOUTH EVERY DAY IN THE MORNING   INSULIN ASPART (NOVOLOG) 100 UNIT/ML FLEXPEN    Inject 15-25 Units into the skin 3 (three) times daily with meals. Please provide pen needles if possible   INSULIN DEGLUDEC (TRESIBA FLEXTOUCH) 200 UNIT/ML FLEXTOUCH PEN    Inject 80-100 Units into the skin daily.   INSULIN SYRINGE-NEEDLE U-100 (BD INSULIN SYRINGE U/F) 31G X 5/16" 0.5 ML MISC    Use to inject insulin 4 times daily. DX:E11.9   INSULIN SYRINGE-NEEDLE U-100 (BD INSULIN SYRINGE U/F) 31G X 5/16" 1 ML MISC    Use to inject tresiba/long acting insulin once daily   INSULIN SYRINGE-NEEDLE U-100 (BD VEO INSULIN SYR U/F 1/2UNIT) 31G X 15/64"  0.3 ML MISC    USE TO INJECT INSULIN FOUR TIMES DAILY AS DIRECTED   JANUVIA 50 MG TABLET    TAKE 1 TABLET BY MOUTH EVERY DAY IN THE MORNING   LANCETS (ONETOUCH ULTRASOFT) LANCETS    Use to test blood sugars daily. Dx: E11.9   LANCETS MISC    USED TO CHECK BLOOD GLUCOSE 3-4 TIMES DAILY   MULTIPLE VITAMIN (MULTIVITAMIN WITH MINERALS) TABS TABLET    Take 1 tablet by mouth 2 (two) times daily.   OMEGA-3 FATTY ACIDS (FISH OIL) 1360 MG CAPS    Take 1,360 mg by mouth daily.    PYRIDOXINE (B-6) 100 MG TABLET    Take 100 mg by mouth 2 (two) times daily.   RAMIPRIL (ALTACE) 5 MG CAPSULE    TAKE 1 CAPSULE BY MOUTH EVERY DAY IN THE MORNING  Modified Medications   No medications on file   Discontinued Medications   AMOXICILLIN-CLAVULANATE (AUGMENTIN) 875-125 MG TABLET    Take 1 tablet by mouth 2 (two) times daily.      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   Prolactinoma, 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   (06-17-2019 pt stated has been stable with no changes and followed by pcp)   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[place])    11-06-2016  lower extremity angiography (dr [name])  mil tibial disease w/ occlusion of the distal peroneal artery and dorsalis pedis artery but has widely patent posterior tibial and anterior tibial arterties   Type 2 diabetes mellitus treated with insulin ([place])    followed by pcp   (06-17-2019  pt stated checks blood sugar daily in am,  fasting sugar-- average 71)   Vitamin B 12 deficiency     Social History   Tobacco Use   Smoking status: Never   Smokeless tobacco: Never  Vaping Use   Vaping Use: Never used  Substance Use Topics   Alcohol use: No   Drug use: Never    Family History  Problem Relation Age of Onset   Sarcoidosis Mother    Stroke Father        early 88s. former smoker   Diabetes Father    Hypertension Father    Hypertension Sister    Diabetes Paternal Grandmother    Congenital heart disease Paternal Grandmother        died of complications   Healthy Daughter    Colon cancer Neg Hx    Colon polyps Neg Hx    Esophageal cancer Neg Hx    Rectal cancer Neg Hx    Stomach cancer Neg Hx     No Known Allergies  Review of Systems   All other systems reviewed and are negative.    OBJECTIVE:    Vitals:   01/30/22 1616  BP: 131/84  Pulse: 74  Resp: 16  Temp: 98.6 F (37 C)  TempSrc: Oral  SpO2: 99%  Weight: Marland Kitchen)  314 lb 6.4 oz (142.6 kg)  Height: 6' (1.829 m)   Body mass index is 42.64 kg/m.  Physical Exam Constitutional:      Appearance: Normal appearance.  HENT:     Head: Normocephalic and atraumatic.  Eyes:     Extraocular Movements: Extraocular movements intact.     Conjunctiva/sclera: Conjunctivae normal.  Abdominal:     General: There is no distension.     Palpations: Abdomen is soft.  Musculoskeletal:        General: Normal range of motion.     Cervical back: Normal range of motion and neck supple.  Neurological:     General: No focal deficit present.     Mental Status: He is alert and oriented to person, place, and time.  Psychiatric:        Mood and Affect: Mood normal.        Behavior: Behavior normal.      Labs and Microbiology:    Latest Ref Rng & Units 12/03/2021    1:57 PM 11/11/2021    1:39 PM 11/08/2021    5:45 AM  CBC  WBC 4.0 - 10.5 K/uL 10.1  6.4  8.1   Hemoglobin 13.0 - 17.0 g/dL 11.0  9.2  8.8   Hematocrit 39.0 - 52.0 % 33.4  28.2  26.3   Platelets 150.0 - 400.0 K/uL 298.0  271.0  224       Latest Ref Rng & Units 11/11/2021    1:39 PM 11/07/2021    4:34 PM 11/07/2021    5:44 AM  CMP  Glucose 70 - 99 mg/dL 151   112   BUN 6 - 23 mg/dL 16   17   Creatinine 0.40 - 1.50 mg/dL 1.18   1.22   Sodium 135 - 145 mEq/L 138   138   Potassium 3.5 - 5.1 mEq/L 4.1   3.7   Chloride 96 - 112 mEq/L 104   109   CO2 19 - 32 mEq/L 26   24   Calcium 8.4 - 10.5 mg/dL 9.5   8.9   Total Protein 6.0 - 8.3 g/dL 7.4   6.4   Total Bilirubin 0.2 - 1.2 mg/dL 0.7  1.6  1.9   Alkaline Phos 39 - 117 U/L 67   43   AST 0 - 37 U/L 25   15   ALT 0 - 53 U/L 23   20      No results found for this or any previous visit (from the past 240 hour(s)).  Imaging:    ASSESSMENT & PLAN:     Osteomyelitis of ankle or foot, acute, right ([place]) Patient has been off antibiotics after finishing his last bottle of Augmentin about 2 weeks ago.  His wound has continued to improve as he has continued with his diligent wound care.  At this time, after a protracted antibiotic course, I am unsure further therapy will yield much in terms of benefit.  Since his wound continues to improve will advise to monitor off antibiotics and continue with wound care.  Will remain available as needed but I am hopeful this extended course has been enough to eradicate his infection.      [facility] 01/30/2022, 4:36 PM

## 2022-02-01 NOTE — Progress Notes (Signed)
[name], [name] ([id number]) [id number] Page 1 of 7 Visit Report for 01/29/2022 Arrival Information Details Patient Name: Date of Service: [place], [place] [name] 01/29/2022 8:00 [name] Medical Record Number: [id number] Patient Account Number: [id number] Date of Birth/Sex: Treating RN: 12-Feb-1970 (52 y.o. [name] Primary Care Myleah Cavendish: [name] Other Clinician: Referring Morgin Halls: Treating Sharlynn Seckinger/Extender: [name] in Treatment: 29 Visit Information History Since Last Visit Added or deleted any medications: No Patient Arrived: Ambulatory Any new allergies or adverse reactions: No Arrival Time: 08:02 Had a fall or experienced change in No Accompanied By: self activities of daily living that may affect Transfer Assistance: None risk of falls: Patient Requires Transmission-Based Precautions: No Signs or symptoms of abuse/neglect since last visito No Patient Has Alerts: Yes Hospitalized since last visit: No Implantable device outside of the clinic excluding No cellular tissue based products placed in the center since last visit: Has Dressing in Place as Prescribed: Yes Pain Present Now: No Electronic Signature(s) Signed: 01/31/2022 4:39:48 PM By: Blanche East RN Entered By: Blanche East on 01/29/2022 08:02:58 -------------------------------------------------------------------------------- Encounter Discharge Information Details Patient Name: Date of Service: [place], [name] 01/29/2022 8:00 [name] Medical Record Number: [id number] Patient Account Number: [id number] Date of Birth/Sex: Treating RN: 17-Apr-1970 (52 y.o. [name] Primary Care Araceli Arango: [name] Other Clinician: Referring Kerie [name]: Treating Ruvi Fullenwider/Extender: [name] in Treatment: 29 Encounter Discharge Information Items Post Procedure Vitals Discharge Condition: Stable Temperature (F): 98.5 Ambulatory  Status: Ambulatory Pulse (bpm): 79 Discharge Destination: Home Respiratory Rate (breaths/min): 16 Transportation: Private Auto Blood Pressure (mmHg): 160/89 Accompanied By: self Schedule Follow-up Appointment: Yes Clinical Summary of Care: Electronic Signature(s) Signed: 01/31/2022 4:39:48 PM By: Blanche East RN Entered By: Blanche East on 01/29/2022 08:33:49 [name], [name] ([id number]) [id number] Page 2 of 7 -------------------------------------------------------------------------------- Lower Extremity Assessment Details Patient Name: Date of Service: [name], [name] 01/29/2022 8:00 [name] Medical Record Number: [id number] Patient Account Number: [id number] Date of Birth/Sex: Treating RN: 1970/12/23 (52 y.o. [name] Primary Care Meloni Hinz: [name] Other Clinician: Referring Kace Hartje: Treating Jayleah Garbers/Extender: [name] in Treatment: 29 Edema Assessment Assessed: [name]: No] [Right: No] Edema: [Left: N] [Right: o] Calf Left: Right: Point of Measurement: From Medial Instep 38 cm Ankle Left: Right: Point of Measurement: From Medial Instep 23 cm Vascular Assessment Pulses: Dorsalis Pedis Palpable: [Right:Yes] Electronic Signature(s) Signed: 01/31/2022 4:39:48 PM By: Blanche East RN Entered By: Blanche East on 01/29/2022 08:05:59 -------------------------------------------------------------------------------- Multi Wound Chart Details Patient Name: Date of Service: [name], [name] 01/29/2022 8:00 [name] Medical Record Number: [id number] Patient Account Number: [id number] Date of Birth/Sex: Treating RN: Apr 06, 1970 (52 y.o. [name] Primary Care Danyle Boening: [name] Other Clinician: Referring Babbette Dalesandro: Treating Tvisha Schwoerer/Extender: [name] in Treatment: 29 Vital Signs Height(in): 72 Capillary Blood Glucose(mg/dl): 89 Weight(lbs): 312 Pulse(bpm): 79 Body Mass  Index(BMI): 42.3 Blood Pressure(mmHg): 160/89 Temperature(F): 98.5 Respiratory Rate(breaths/min): 16 [2:Photos:] [N/A:N/A] Right, Lateral, Plantar Foot N/A N/A Wound Location: Gradually Appeared N/A N/A Wounding Event: Diabetic Wound/Ulcer of the Lower N/A N/A Primary Etiology: Extremity Type II Diabetes N/A N/A Comorbid History: 12/19/2016 N/A N/A Date Acquired: 72 N/A N/A Weeks of Treatment: Open N/A N/A Wound Status: No N/A N/A Wound Recurrence: 0.1x0.9x0.9 N/A N/A Measurements L x W x D (cm) 0.071 N/A N/A A (cm) : rea 0.064 N/A N/A Volume (cm) : 98.00% N/A N/A % Reduction in A rea: 99.60% N/A N/A % Reduction  in Volume: 12 Starting Position 1 (o'clock): 12 Ending Position 1 (o'clock): 0.7 Maximum Distance 1 (cm): Yes N/A N/A Undermining: Grade 3 N/A N/A Classification: Medium N/A N/A Exudate A mount: Serosanguineous N/A N/A Exudate Type: red, brown N/A N/A Exudate Color: Thickened N/A N/A Wound Margin: Medium (34-66%) N/A N/A Granulation A mount: Pink N/A N/A Granulation Quality: Medium (34-66%) N/A N/A Necrotic A mount: Fat Layer (Subcutaneous Tissue): Yes N/A N/A Exposed Structures: Fascia: No Tendon: No Muscle: No Joint: No Bone: No Small (1-33%) N/A N/A Epithelialization: Debridement - Selective/Open Wound N/A N/A Debridement: Pre-procedure Verification/Time Out 08:20 N/A N/A Taken: Lidocaine 5% topical ointment N/A N/A Pain Control: Callus, Slough N/A N/A Tissue Debrided: Skin/Epidermis N/A N/A Level: 0.09 N/A N/A Debridement A (sq cm): rea Curette N/A N/A Instrument: Minimum N/A N/A Bleeding: Pressure N/A N/A Hemostasis A chieved: Procedure was tolerated well N/A N/A Debridement Treatment Response: 0.1x0.9x0.9 N/A N/A Post Debridement Measurements L x W x D (cm) 0.064 N/A N/A Post Debridement Volume: (cm) Callus: Yes N/A N/A Periwound Skin Texture: No Abnormalities Noted N/A N/A Periwound Skin Moisture: No  Abnormalities Noted N/A N/A Periwound Skin Color: No Abnormality N/A N/A Temperature: Debridement N/A N/A Procedures Performed: Treatment Notes Electronic Signature(s) Signed: 01/29/2022 8:27:20 AM By: [name] MD FACS Entered By: [name] on 01/29/2022 08:27:20 -------------------------------------------------------------------------------- Multi-Disciplinary Care Plan Details Patient Name: Date of Service: [name], [name] 01/29/2022 8:00 [name] Medical Record Number: [id number] Patient Account Number: [id number] Date of Birth/Sex: Treating RN: Nov 07, 1970 (52 y.o. [name] Primary Care Lanier Felty: [name] Other Clinician: Referring Rucha Wissinger: Treating Tiaja Hagan/Extender: [name] in Treatment: [place] reviewed with physician [address] [name], [name] ([id number]) [id number] Page 4 of 7 Nutrition Nursing Diagnoses: Impaired glucose control: actual or potential Potential for alteratiion in Nutrition/Potential for imbalanced nutrition Goals: Patient/caregiver will maintain therapeutic glucose control Date Initiated: 07/29/2021 Target Resolution Date: 02/21/2022 Goal Status: Active Interventions: Assess patient nutrition upon admission and as needed per policy Provide education on elevated blood sugars and impact on wound healing Treatment Activities: Patient referred to Primary Care Physician for further nutritional evaluation : 07/29/2021 Notes: Osteomyelitis Nursing Diagnoses: Infection: osteomyelitis Knowledge deficit related to disease process and management Goals: Patient/caregiver will verbalize understanding of disease process and disease management Date Initiated: 07/29/2021 Date Inactivated: 11/12/2021 Target Resolution Date: 11/19/2021 Goal Status: Met Patient's osteomyelitis will resolve Date Initiated: 07/29/2021 Target Resolution Date: 02/21/2022 Goal  Status: Active Interventions: Assess for signs and symptoms of osteomyelitis resolution every visit Provide education on osteomyelitis Treatment Activities: Consult for HBO : 07/29/2021 Notes: Electronic Signature(s) Signed: 01/29/2022 8:15:26 AM By: Blanche East RN Entered By: Blanche East on 01/29/2022 08:15:26 -------------------------------------------------------------------------------- Pain Assessment Details Patient Name: Date of Service: [place], [name] 01/29/2022 8:00 [name] Medical Record Number: [id number] Patient Account Number: [id number] Date of Birth/Sex: Treating RN: [birth year] (52 y.o. [name] Primary Care Jahir Halt: [name] Other Clinician: Referring Torrance Stockley: Treating Krystin Keeven/Extender: [name] in Treatment: 29 Active Problems Location of Pain Severity and Description of Pain Patient Has Paino No Site Locations Rate the pain. [name], [name] ([id number]) [id number] Page 5 of 7 Rate the pain. Current Pain Level: 0 Pain Management and Medication Current Pain Management: Electronic Signature(s) Signed: 01/31/2022 4:39:48 PM By: Blanche East RN Entered By: Blanche East on 01/29/2022 08:04:35 -------------------------------------------------------------------------------- Patient/Caregiver Education Details Patient Name: Date of Service: [name], [name] 1/10/2024andnbsp8:00 [place] Record Number: [id number]  Patient Account Number: [id number] Date of Birth/Gender: Treating RN: 1970-07-04 (52 y.o. [name] Primary Care Physician: [name] Other Clinician: Referring Physician: Treating Physician/Extender: [name] in Treatment: 29 Education Assessment Education Provided To: Patient Education Topics Provided Elevated Blood Sugar/ Impact on Healing: Methods: Explain/Verbal Responses: Reinforcements needed, State content  correctly Wound Debridement: Methods: Explain/Verbal Responses: State content correctly Wound/Skin Impairment: Methods: Explain/Verbal Responses: Reinforcements needed, State content correctly Electronic Signature(s) Signed: 01/31/2022 4:39:48 PM By: Blanche East RN Entered By: Blanche East on 01/29/2022 08:15:54 [name], [name] ([id number]) [id number] Page 6 of 7 -------------------------------------------------------------------------------- Wound Assessment Details Patient Name: Date of Service: [name], [name] 01/29/2022 8:00 [name] Medical Record Number: [id number] Patient Account Number: [id number] Date of Birth/Sex: Treating RN: 1970/12/27 (52 y.o. [name] Primary Care Rochell Mabie: [name] Other Clinician: Referring [name]: Treating Neythan Kozlov/Extender: [name] in Treatment: 29 Wound Status Wound Number: 2 Primary Etiology: Diabetic Wound/Ulcer of the Lower Extremity Wound Location: Right, Lateral, Plantar Foot Wound Status: Open Wounding Event: Gradually Appeared Comorbid History: Type II Diabetes Date Acquired: 12/19/2016 Weeks Of Treatment: 29 Clustered Wound: No Photos Wound Measurements Length: (cm) 0.1 Width: (cm) 0.9 Depth: (cm) 0.9 Area: (cm) 0.071 Volume: (cm) 0.064 % Reduction in Area: 98% % Reduction in Volume: 99.6% Epithelialization: Small (1-33%) Tunneling: No Undermining: Yes Starting Position (o'clock): 12 Ending Position (o'clock): 12 Maximum Distance: (cm) 0.7 Wound Description Classification: Grade 3 Wound Margin: Thickened Exudate Amount: Medium Exudate Type: Serosanguineous Exudate Color: red, brown Foul Odor After Cleansing: No Slough/Fibrino Yes Wound Bed Granulation Amount: Medium (34-66%) Exposed Structure Granulation Quality: Pink Fascia Exposed: No Necrotic Amount: Medium (34-66%) Fat Layer (Subcutaneous Tissue) Exposed: Yes Tendon Exposed: No Muscle  Exposed: No Joint Exposed: No Bone Exposed: No Periwound Skin Texture Texture Color No Abnormalities Noted: No No Abnormalities Noted: Yes Callus: Yes Temperature / Pain Temperature: No Abnormality Moisture No Abnormalities Noted: Yes Treatment Notes [name], [name] ([id number]) [id number] Page 7 of 7 Wound #2 (Foot) Wound Laterality: Plantar, Right, Lateral Cleanser Soap and Water Discharge Instruction: May shower and wash wound with dial antibacterial soap and water prior to dressing change. Wound Cleanser Discharge Instruction: Cleanse the wound with wound cleanser prior to applying a clean dressing using gauze sponges, not tissue or cotton balls. Peri-Wound Care cotton tipped applicators Topical Primary Dressing Iodoform packing strip 1/4 (in) Discharge Instruction: Lightly pack as instructed Secondary Dressing Woven Gauze Sponge, Non-Sterile 4x4 in Discharge Instruction: Apply over primary dressing as directed. Secured With Elastic Bandage 4 inch (ACE bandage) Discharge Instruction: Secure with ACE bandage as directed. Paper Tape, 2x10 (in/yd) Discharge Instruction: Secure dressing with tape as directed. Compression Wrap Kerlix Roll 4.5x3.1 (in/yd) Discharge Instruction: Apply Kerlix and Coban compression as directed. Compression Stockings Add-Ons Electronic Signature(s) Signed: 01/31/2022 4:39:48 PM By: Blanche East RN Entered By: Blanche East on 01/29/2022 08:11:21 -------------------------------------------------------------------------------- Vitals Details Patient Name: Date of Service: [name], [name] 01/29/2022 8:00 [name] Medical Record Number: [id number] Patient Account Number: [id number] Date of Birth/Sex: Treating RN: Sep 22, 1970 (52 y.o. [name] Primary Care Hayley Horn: [name] Other Clinician: Referring [place] Cost: Treating Jeanette Rauth/Extender: [name] in Treatment: 29 Vital  Signs Time Taken: 08:03 Temperature (F): 98.5 Height (in): 72 Pulse (bpm): 79 Weight (lbs): 312 Respiratory Rate (breaths/min): 16 Body Mass Index (BMI): 42.3 Blood Pressure (mmHg): 160/89 Capillary Blood Glucose (mg/dl): 89 Reference Range: 80 - 120 mg / dl Electronic Signature(s) Signed: 01/31/2022 4:39:48  PM By: Blanche East RN Entered By: Blanche East on 01/29/2022 08:04:06

## 2022-02-01 NOTE — Progress Notes (Signed)
[name], [name] ([id number]) [id number] Page 1 of 9 Visit Report for 01/29/2022 Chief Complaint Document Details Patient Name: Date of Service: [name], [name] 01/29/2022 8:00 [name] Medical Record Number: [id number] Patient Account Number: [id number] Date of Birth/Sex: Treating RN: 1970-01-26 (52 y.o. [name] Primary Care Provider: [name] Other Clinician: Referring Provider: Treating Provider/Extender: [name] in Treatment: 29 Information Obtained from: Patient Chief Complaint Patients presents for treatment of an open diabetic ulcer Electronic Signature(s) Signed: 01/29/2022 8:27:28 AM By: [name] MD FACS Entered By: [name] on 01/29/2022 08:27:28 -------------------------------------------------------------------------------- Debridement Details Patient Name: Date of Service: [name], [name] 01/29/2022 8:00 [name] Medical Record Number: [id number] Patient Account Number: [id number] Date of Birth/Sex: Treating RN: 1970/10/29 (52 y.o. [name] Primary Care Provider: [name] Other Clinician: Referring Provider: Treating Provider/Extender: [name] in Treatment: 29 Debridement Performed for Assessment: Wound #2 Right,Lateral,Plantar Foot Performed By: Physician [name], MD Debridement Type: Debridement Severity of Tissue Pre Debridement: Fat layer exposed Level of Consciousness (Pre-procedure): Awake and Alert Pre-procedure Verification/Time Out Yes - 08:20 Taken: Start Time: 08:21 Pain Control: Lidocaine 5% topical ointment T Area Debrided (L [name] W): otal 0.1 (cm) [name] 0.9 (cm) = 0.09 (cm) Tissue and other material debrided: Non-Viable, Callus, Slough, Skin: Epidermis, Slough Level: Skin/Epidermis Debridement Description: Selective/Open Wound Instrument: Curette Bleeding: Minimum Hemostasis Achieved: Pressure Response to Treatment: Procedure was  tolerated well Level of Consciousness (Post- Awake and Alert procedure): Post Debridement Measurements of Total Wound Length: (cm) 0.1 Width: (cm) 0.9 Depth: (cm) 0.9 Volume: (cm) 0.064 Character of Wound/Ulcer Post Debridement: Requires Further Debridement Severity of Tissue Post Debridement: Fat layer exposed Post Procedure Diagnosis Same as Pre-procedure [name], [name] ([id number]) [id number] Page 2 of 9 Notes Scribed for [name] by Blanche East, RN Electronic Signature(s) Signed: 01/29/2022 11:49:27 AM By: [name] MD FACS Signed: 01/31/2022 4:39:48 PM By: Blanche East RN Entered By: Blanche East on 01/29/2022 08:21:58 -------------------------------------------------------------------------------- HPI Details Patient Name: Date of Service: [name], [name] 01/29/2022 8:00 [name] Medical Record Number: [id number] Patient Account Number: [id number] Date of Birth/Sex: Treating RN: 1970/04/24 (52 y.o. 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 The most recent amputation was in August 2022. At that operation, antibiotic beads were placed in the wound. He has been managed by podiatry for his procedures and management of his wounds. He has been in a Water engineer. He is on oral doxycycline. They have been using Betadine wet to dry dressings along with Iodosorb. The patient states that when he thinks the wound is getting too dry, he applies topical Neosporin. At his last visit, on June 7 of this year, the podiatrist determined that he felt the wound was stalled and referred him to  wound care for additional evaluation and management. An MRI has been ordered, but not yet scheduled or performed. Pathology from his operation in August 2022 demonstrated findings consistent with chronic osteomyelitis. Today, there is a large irregular wound on the plantar surface of his right foot, at about the level of the fifth metatarsal head. This tracks through to a pinpoint opening on the dorsal lateral portion  of his foot. The intake nurse reported purulent drainage. There is some malodor from the wound. No frank necrosis identified. 07/11/2021: Today, the wounds do not connect. I attempted multiple times from various directions and the shared tunnel is no longer open. He has some slough accumulation on the dorsal part of his foot as well as slough and callus buildup on the plantar surface. His MRI is scheduled for June 29. No purulent drainage or malodor appreciated today. 07/19/2021: The lateral foot wound has closed and there is no tunnel connecting the plantar foot wound to that site. The plantar foot wound still probes quite deeply, however. There is some slough, eschar, and nonviable tissue accumulated in the wound bed. No malodorous drainage present. His MRI was performed yesterday and is consistent with fairly extensive osteomyelitis. 07/29/2021: The patient has an appointment with infectious disease on July 18 to treat his osteomyelitis. The plantar wound still probes quite deeply, approaching bone. The orifice has narrowed quite substantially, however, making it more difficult for him to pack. 08/05/2021: The tunnel connecting the lateral foot wound to the plantar foot wound has reopened. He sees infectious disease tomorrow to discuss long-term antibiotic treatment for osteomyelitis. There was a bit of murky drainage in the wound, but this was noted after he had had topical lidocaine applied so may have just been a blob of the anesthetic. No odor or frank purulent drainage. The wound  probes deeply at the midfoot approaching bone. He does have MRI results confirming his diagnosis of osteomyelitis. He has failed to progress with conventional treatment and I think his best chance for preservation of the foot is to initiate hyperbaric oxygen therapy. 08/13/2021: The tunnel connecting the 2 wounds has closed again. He has a PICC line and is getting IV daptomycin and cefepime. EKG and chest [name]-ray are within normal limits. He is scheduled to start hyperbaric oxygen therapy tomorrow. The wound in his midfoot probes deeply, approaching bone. The skin at the orifice continues to heaped up and threatens to close over despite the large cavity within. No erythema, induration, or purulent drainage. The wound on his lateral foot is fairly small and quite clean. 08/19/2021: The lateral foot wound has nearly closed. The wound in his midfoot does not probe quite as deeply today. The skin at the orifice continues to try to roll in and obscure the opening. No frankly necrotic tissue appreciated. He is tolerating hyperbaric oxygen therapy. 08/26/2021: The lateral foot wound has closed completely. The wound in his midfoot is shallower again today. The wound orifice is contracting. We are using gentamicin and silver alginate. He continues on IV daptomycin and cefepime and is tolerating hyperbaric oxygen therapy without difficulty. 09/02/2021: His foot is a little bit sore today but he was up walking on it all weekend doing back to school shopping with his daughter. The tunneling is down to about 3 cm and I do not appreciate bone at the tip of the probe. The wound is clean but has some callus creating an overhanging lip at the distal aspect. He continues to receive hyperbaric oxygen therapy as well as IV daptomycin and cefepime. 09/09/2021: His wound continues to improve. The tunnel has come in by 0.9 cm. He continues to form callus around the orifice of the wound. He is tolerating hyperbaric oxygen therapy  along with his IV antibiotics. 09/16/2021: The tunneling is down to just half a centimeter. He continues to build up callus around the orifice of the wound. He completed his course  of IV antibiotics and his PICC line is scheduled to be removed this Friday. He is tolerating hyperbaric oxygen without difficulty. We have been applying topical gentamicin and silver alginate to his wound. 09/24/2021: The wound depth has come in even further. There is a little bit of callus buildup around the orifice. The opening is quite narrow, at this point. [name], [name] ([id number]) [id number] Page 3 of 9 09/30/2021: The wound depth is about the same this week. The opening continues to contract with callus accumulation. There is some discoloration of the skin on the lateral part of his foot and he admitted to walking more than usual this weekend and also wearing a different pair shoes. He continues to tolerate hyperbaric oxygen therapy without difficulty. 10/07/2021: The wound depth has contracted a bit and is now approximately 1 to 1.5 cm. The orifice of the wound continues to try and close in over the space. There is just some callus and skin around the margins obscuring the orifice. 10/14/2021: The wound depth has come in a little bit more. The orifice of the wound is rolling inwards with epithelium and callus, making it difficult to pack the wound. 10/21/2021: There is still 1 portion of the wound, at the lateral aspect, that is still a couple of centimeters deep. The architecture of the wound makes this somewhat challenging to access and pack. Everything else looks clean. He has his usual accumulation of callus and skin, narrowing the orifice. 10/29/2021: No significant change to the depth of the wound. The orifice continues to contract with callus and skin narrowing the opening. His wife has been packing the wound and doing an excellent job. 11/05/2021: The depth of the wound has come in  by about a centimeter. There is less callus accumulation around the orificedespite this, the wound is narrower today. We are still awaiting insurance approval for Kerecis powder. 11/12/2021: The depth of the wound continues to contract. He has accumulated some callus around the orifice, as usual. We have received a verbal confirmation that he is approved for Kerecis, but no formal paperwork has yet been received. 11/19/2021: The depth continues to come in. There is callus accumulation around the orifice, as usual. He has been formally approved for Kerecis, but unfortunately we do not have a billing mechanism for the powder in [employer] yet. We are working to address this so we can begin treatment. 11/25/2021: Continued filling in of the depth of the wound. Continued buildup of callus around the orifice. No concern for infection. As we do not have a way to bill for Kerecis powder, but are capable of billing for the [facility] sheet, we are going to use that on him instead. 12/03/2021: The depth of the wound continues to fill in. As usual, he has accumulated callus and skin around the orifice of the wound. No concern for infection. He will complete his hyperbaric oxygen therapy this week. 12/17/2021: The wound depth continues to contract. There is callus and skin accumulation around the orifice of the wound, as per usual. There is more epithelium on the actual wound surface. Unfortunately, his Leroy Kennedy was not ordered. 12/24/2021: The wound depth has come in a little bit more this week. There is less skin accumulation around the orifice, but still some callus. On questioning, he admits to spending a fair amount of time on his feet. 12/12; this is a very difficult wound to assess size slitlike wound with some depth but minimal opening. I applied Kerecis No. 4 12/18; Kerecis No.  5 01/15/2022: His wound measured deeper today. There is also some evidence of pressure induced tissue injury around the lateral aspect of  his foot. The patient reports that he was wearing different shoes than usual and felt like his foot was rolling a lot over the weekend. There is heavy callus around the orifice of the wound. No concern for infection. 01/21/2022: The depth decreased today from 1.7 to 1.0 cm. Significantly less tissue damage as compared to last week. Still with callus and skin accumulation around the orifice. 01/29/2022: The wound continues to contract and the depth is coming in further. We are still working on getting [employer] for him. Electronic Signature(s) Signed: 01/29/2022 8:28:06 AM By: [name] MD FACS Entered By: [name] on 01/29/2022 08:28:06 -------------------------------------------------------------------------------- Physical Exam Details Patient Name: Date of Service: [name], [name] 01/29/2022 8:00 [name] Medical Record Number: [id number] Patient Account Number: [id number] Date of Birth/Sex: Treating RN: Mar 30, 1970 (52 y.o. [name] Primary Care Provider: [name] Other Clinician: Referring Provider: Treating Provider/Extender: [name] in Treatment: 29 Constitutional Hypertensive, asymptomatic. . . . no acute distress. Respiratory Normal work of breathing on room air. Notes 01/29/2022: The wound continues to contract and the depth is coming in further. Electronic Signature(s) Signed: 01/29/2022 8:28:36 AM By: [name] MD FACS [name], [name] ([id number]) [id number] Page 4 of 9 Entered By: [name] on 01/29/2022 08:28:36 -------------------------------------------------------------------------------- Physician Orders Details Patient Name: Date of Service: [name], [name] 01/29/2022 8:00 [name] Medical Record Number: [id number] Patient Account Number: [id number] Date of Birth/Sex: Treating RN: March 07, 1970 (52 y.o. 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 - Dr Celine Ahr Return A Anesthetic Wound #2 Right,Lateral,Plantar Foot (In clinic) Topical Lidocaine 4% applied to wound bed Cellular or Tissue Based Products Cellular or Tissue Based Product Type: - Kerecis #6 01/29/22- [place]- insurance denied, [facility] Dy is working on getting pt approved. Cellular or Tissue Based Product applied to wound bed, secured with steri-strips, cover with Adaptic or Mepitel. (DO NOT REMOVE). Off-Loading Other: - Try to not stand on your feet too much Wound Treatment Wound #2 - Foot Wound Laterality: Plantar, Right, Lateral Cleanser: Soap and Water Every Other Day/15 Days Discharge Instructions: May shower and wash wound with dial antibacterial soap and water prior to dressing change. Cleanser: Wound Cleanser (Generic) Every Other Day/15 Days Discharge Instructions: Cleanse the wound with wound cleanser prior to applying a clean dressing using gauze sponges, not tissue or cotton balls. Peri-Wound Care: cotton tipped applicators (Generic) Every Other Day/15 Days Prim Dressing: Iodoform packing strip 1/4 (in) Every Other Day/15 Days ary Discharge Instructions: Lightly pack as instructed Secondary Dressing: Woven Gauze Sponge, Non-Sterile 4x4 in Every Other Day/15 Days Discharge Instructions: Apply over primary dressing as directed. Secured With: Elastic Bandage 4 inch (ACE bandage) Every Other Day/15 Days Discharge Instructions: Secure with ACE bandage as directed. Secured With: Paper Tape, 2x10  (in/yd) (Generic) Every Other Day/15 Days Discharge Instructions: Secure dressing with tape as directed. Compression Wrap: Kerlix Roll 4.5x3.1 (in/yd) (Generic) Every Other Day/15 Days Discharge Instructions: Apply Kerlix and Coban compression as  directed. Electronic Signature(s) Signed: 01/29/2022 11:49:27 AM By: [name] MD FACS Previous Signature: 01/29/2022 8:15:17 AM Version By: Blanche East RN Entered By: [name] on 01/29/2022 08:28:54 [name], [name] ([id number]) [id number] Page 5 of 9 -------------------------------------------------------------------------------- Problem List Details Patient Name: Date of Service: [name], [name] 01/29/2022 8:00 [name] Medical Record Number: [id number] Patient Account Number: [id number] Date of Birth/Sex: Treating RN: 09/21/1970 (52 y.o. 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 01/29/2022 8:00 [name] Medical Record Number: [id number] Patient Account Number: [id number] Date of Birth/Sex: Treating RN: Nov 19, 1970 (52 y.o. 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 The most recent amputation was in August 2022. At that operation, antibiotic beads were placed in the wound. He has been managed by podiatry for his procedures and management of his wounds. He has been in a Water engineer. He is on oral doxycycline. They have been using Betadine wet to dry dressings along with Iodosorb. The patient states that when he thinks the wound is getting too dry, he applies topical Neosporin. At his last visit, on June 7 of this year, the podiatrist determined that he felt the wound was stalled and referred him to wound care for additional evaluation and management. An MRI has been ordered, but not yet scheduled or performed. Pathology from his operation in August 2022 demonstrated findings consistent with chronic [name], [name] ([id number]) [id number] Page 6 of 9 osteomyelitis. Today, there is a large irregular wound on the plantar surface of his right foot, at about the level of the fifth metatarsal head. This tracks through to a pinpoint opening on the dorsal lateral portion of his foot. The intake nurse reported purulent drainage. There is some malodor from the wound. No frank necrosis identified. 07/11/2021: Today, the wounds do not connect. I attempted multiple times from various directions and the shared tunnel is no longer open. He has some slough accumulation on the dorsal part of his foot as well as slough and callus buildup on the  plantar surface. His MRI is scheduled for June 29. No purulent drainage or malodor appreciated today. 07/19/2021: The lateral foot wound has closed and there is no tunnel connecting the plantar foot wound to that site. The plantar foot wound still probes quite deeply, however. There is some slough, eschar, and nonviable tissue accumulated in the wound bed. No malodorous drainage present. His MRI was performed yesterday and is consistent with fairly extensive osteomyelitis. 07/29/2021: The  patient has an appointment with infectious disease on July 18 to treat his osteomyelitis. The plantar wound still probes quite deeply, approaching bone. The orifice has narrowed quite substantially, however, making it more difficult for him to pack. 08/05/2021: The tunnel connecting the lateral foot wound to the plantar foot wound has reopened. He sees infectious disease tomorrow to discuss long-term antibiotic treatment for osteomyelitis. There was a bit of murky drainage in the wound, but this was noted after he had had topical lidocaine applied so may have just been a blob of the anesthetic. No odor or frank purulent drainage. The wound probes deeply at the midfoot approaching bone. He does have MRI results confirming his diagnosis of osteomyelitis. He has failed to progress with conventional treatment and I think his best chance for preservation of the foot is to initiate hyperbaric oxygen therapy. 08/13/2021: The tunnel connecting the 2 wounds has closed again. He has a PICC line and is getting IV daptomycin and cefepime. EKG and chest [name]-ray are within normal limits. He is scheduled to start hyperbaric oxygen therapy tomorrow. The wound in his midfoot probes deeply, approaching bone. The skin at the orifice continues to heaped up and threatens to close over despite the large cavity within. No erythema, induration, or purulent drainage. The wound on his lateral foot is fairly small and quite clean. 08/19/2021: The  lateral foot wound has nearly closed. The wound in his midfoot does not probe quite as deeply today. The skin at the orifice continues to try to roll in and obscure the opening. No frankly necrotic tissue appreciated. He is tolerating hyperbaric oxygen therapy. 08/26/2021: The lateral foot wound has closed completely. The wound in his midfoot is shallower again today. The wound orifice is contracting. We are using gentamicin and silver alginate. He continues on IV daptomycin and cefepime and is tolerating hyperbaric oxygen therapy without difficulty. 09/02/2021: His foot is a little bit sore today but he was up walking on it all weekend doing back to school shopping with his daughter. The tunneling is down to about 3 cm and I do not appreciate bone at the tip of the probe. The wound is clean but has some callus creating an overhanging lip at the distal aspect. He continues to receive hyperbaric oxygen therapy as well as IV daptomycin and cefepime. 09/09/2021: His wound continues to improve. The tunnel has come in by 0.9 cm. He continues to form callus around the orifice of the wound. He is tolerating hyperbaric oxygen therapy along with his IV antibiotics. 09/16/2021: The tunneling is down to just half a centimeter. He continues to build up callus around the orifice of the wound. He completed his course of IV antibiotics and his PICC line is scheduled to be removed this Friday. He is tolerating hyperbaric oxygen without difficulty. We have been applying topical gentamicin and silver alginate to his wound. 09/24/2021: The wound depth has come in even further. There is a little bit of callus buildup around the orifice. The opening is quite narrow, at this point. 09/30/2021: The wound depth is about the same this week. The opening continues to contract with callus accumulation. There is some discoloration of the skin on the lateral part of his foot and he admitted to walking more than usual this weekend and also  wearing a different pair shoes. He continues to tolerate hyperbaric oxygen therapy without difficulty. 10/07/2021: The wound depth has contracted a bit and is now approximately 1 to 1.5 cm. The orifice of the  wound continues to try and close in over the space. There is just some callus and skin around the margins obscuring the orifice. 10/14/2021: The wound depth has come in a little bit more. The orifice of the wound is rolling inwards with epithelium and callus, making it difficult to pack the wound. 10/21/2021: There is still 1 portion of the wound, at the lateral aspect, that is still a couple of centimeters deep. The architecture of the wound makes this somewhat challenging to access and pack. Everything else looks clean. He has his usual accumulation of callus and skin, narrowing the orifice. 10/29/2021: No significant change to the depth of the wound. The orifice continues to contract with callus and skin narrowing the opening. His wife has been packing the wound and doing an excellent job. 11/05/2021: The depth of the wound has come in by about a centimeter. There is less callus accumulation around the orificeoodespite this, the wound is narrower today. We are still awaiting insurance approval for Kerecis powder. 11/12/2021: The depth of the wound continues to contract. He has accumulated some callus around the orifice, as usual. We have received a verbal confirmation that he is approved for Kerecis, but no formal paperwork has yet been received. 11/19/2021: The depth continues to come in. There is callus accumulation around the orifice, as usual. He has been formally approved for Kerecis, but unfortunately we do not have a billing mechanism for the powder in [employer] yet. We are working to address this so we can begin treatment. 11/25/2021: Continued filling in of the depth of the wound. Continued buildup of callus around the orifice. No concern for infection. As we do not have a way to bill for  Kerecis powder, but are capable of billing for the [facility] sheet, we are going to use that on him instead. 12/03/2021: The depth of the wound continues to fill in. As usual, he has accumulated callus and skin around the orifice of the wound. No concern for infection. He will complete his hyperbaric oxygen therapy this week. 12/17/2021: The wound depth continues to contract. There is callus and skin accumulation around the orifice of the wound, as per usual. There is more epithelium on the actual wound surface. Unfortunately, his Leroy Kennedy was not ordered. 12/24/2021: The wound depth has come in a little bit more this week. There is less skin accumulation around the orifice, but still some callus. On questioning, he admits to spending a fair amount of time on his feet. 12/12; this is a very difficult wound to assess size slitlike wound with some depth but minimal opening. I applied Kerecis No. 4 12/18; Kerecis No. 5 01/15/2022: His wound measured deeper today. There is also some evidence of pressure induced tissue injury around the lateral aspect of his foot. The patient reports that he was wearing different shoes than usual and felt like his foot was rolling a lot over the weekend. There is heavy callus around the orifice of the wound. No concern for infection. [name], [name] ([id number]) [id number] Page 7 of 9 01/21/2022: The depth decreased today from 1.7 to 1.0 cm. Significantly less tissue damage as compared to last week. Still with callus and skin accumulation around the orifice. 01/29/2022: The wound continues to contract and the depth is coming in further. We are still working on getting [employer] for him. Patient History Information obtained from Patient. Social History Never smoker, Marital Status - Married, Alcohol Use - Never, Drug Use - No History, Caffeine Use - Daily -  T coffee. ea; Medical History Endocrine Patient has history of Type II  Diabetes Hospitalization/Surgery History - Amuptation of 3rd,4th and 5th toes of Right foot;Oral Surgery;Anal Fissure surgery; Cholecystectomy. Objective Constitutional Hypertensive, asymptomatic. no acute distress. Vitals Time Taken: 8:03 AM, Height: 72 in, Weight: 312 lbs, BMI: 42.3, Temperature: 98.5 F, Pulse: 79 bpm, Respiratory Rate: 16 breaths/min, Blood Pressure: 160/89 mmHg, Capillary Blood Glucose: 89 mg/dl. Respiratory Normal work of breathing on room air. General Notes: 01/29/2022: The wound continues to contract and the depth is coming in further. Integumentary (Hair, Skin) Wound #2 status is Open. Original cause of wound was Gradually Appeared. The date acquired was: 12/19/2016. The wound has been in treatment 29 weeks. The wound is located on the [place]. The wound measures 0.1cm length [name] 0.9cm width [name] 0.9cm depth; 0.071cm^2 area and 0.064cm^3 volume. There is Fat Layer (Subcutaneous Tissue) exposed. There is no tunneling noted, however, there is undermining starting at 12:00 and ending at 12:00 with a maximum distance of 0.7cm. There is a medium amount of serosanguineous drainage noted. The wound margin is thickened. There is medium (34-66%) pink granulation within the wound bed. There is a medium (34-66%) amount of necrotic tissue within the wound bed. The periwound skin appearance had no abnormalities noted for moisture. The periwound skin appearance had no abnormalities noted for color. The periwound skin appearance exhibited: Callus. Periwound temperature was noted as No Abnormality. 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 There was a Selective/Open Wound Skin/Epidermis Debridement with a total area of 0.09 sq cm performed by [name], MD. With the following instrument(s): Curette to remove Non-Viable tissue/material. Material removed includes Callus, Slough, and Skin: Epidermis after achieving pain control using Lidocaine 5% topical ointment. No specimens were taken. A time out was conducted at 08:20, prior to the start of the procedure. A Minimum amount of bleeding was controlled with Pressure. The procedure was tolerated well. Post Debridement Measurements: 0.1cm length [name] 0.9cm width [name] 0.9cm depth; 0.064cm^3 volume. Character of Wound/Ulcer Post Debridement requires further debridement. Severity of Tissue Post Debridement is: Fat layer exposed. Post procedure Diagnosis Wound #2: Same as Pre-Procedure General Notes: Scribed for [name] by Blanche East, RN. [address] [name], [name] ([id number]) [id number] Page 8 of 9 Follow-up Appointments: Return Appointment in 1 week. - Dr Celine Ahr Anesthetic: Wound #2 Right,Lateral,Plantar Foot: (In clinic) Topical Lidocaine 4% applied to wound bed Cellular or Tissue Based Products: Cellular or Tissue Based Product Type: - Kerecis #6 01/29/22- [place] denied, [facility] is working on getting pt approved. Cellular or Tissue Based Product applied to wound bed, secured with steri-strips, cover with Adaptic or Mepitel. (DO NOT REMOVE). Off-Loading: Other: - Try to not stand on your feet too much WOUND #2: - Foot Wound Laterality: Plantar, Right, Lateral Cleanser: Soap and Water Every Other Day/15 Days Discharge Instructions: May shower and wash wound with dial antibacterial soap and water prior to dressing change. Cleanser: Wound Cleanser (Generic) Every Other Day/15 Days Discharge Instructions: Cleanse the wound with wound cleanser prior to applying a clean  dressing using gauze sponges, not tissue or cotton balls. Peri-Wound Care: cotton tipped applicators (Generic) Every Other Day/15  Days Prim Dressing: Iodoform packing strip 1/4 (in) Every Other Day/15 Days ary Discharge Instructions: Lightly pack as instructed Secondary Dressing: Woven Gauze Sponge, Non-Sterile 4x4 in Every Other Day/15 Days Discharge Instructions: Apply over primary dressing as directed. Secured With: Elastic Bandage 4 inch (ACE bandage) Every Other Day/15 Days Discharge Instructions: Secure with ACE bandage as directed. Secured With: Paper T ape, 2x10 (in/yd) (Generic) Every Other Day/15 Days Discharge Instructions: Secure dressing with tape as directed. Com pression Wrap: Kerlix Roll 4.5x3.1 (in/yd) (Generic) Every Other Day/15 Days Discharge Instructions: Apply Kerlix and Coban compression as directed. 01/29/2022: The wound continues to contract and the depth is coming in further. I used a curette to debride callus, skin, and slough from his wound. While we are waiting our appeal on Kerecis, we will continue to pack the wound with iodoform packing strips. Continue to offload using modifications of shoe insoles and foam doughnuts. Follow-up in 1 week. Electronic Signature(s) Signed: 01/29/2022 8:29:39 AM By: [name] MD FACS Entered By: [name] on 01/29/2022 08:29:39 -------------------------------------------------------------------------------- HxROS Details Patient Name: Date of Service: [name], [name] 01/29/2022 8:00 [name] Medical Record Number: [id number] Patient Account Number: [id number] Date of Birth/Sex: Treating RN: Aug 30, 1970 (52 y.o. [name] Primary Care Provider: [name] Other Clinician: Referring Provider: Treating Provider/Extender: [name] in Treatment: 29 Information Obtained From Patient Endocrine Medical History: Positive for: Type II Diabetes Immunizations Pneumococcal Vaccine: Received  Pneumococcal Vaccination: Yes Received Pneumococcal Vaccination On or After 60th Birthday: No Implantable Devices None Hospitalization / Surgery History Type of Hospitalization/Surgery Amuptation of 3rd,4th and 5th toes of Right foot;Oral Surgery;Anal Fissure surgery; Cholecystectomy [name], [name] ([id number]) [id number] Page 9 of 82 Family and Social History Never smoker; Marital Status - Married; Alcohol Use: Never; Drug Use: No History; Caffeine Use: Daily - T coffee; Financial Concerns: No; Food, ea; Clothing or Shelter Needs: No; Support System Lacking: No; Transportation Concerns: No Electronic Signature(s) Signed: 01/29/2022 11:49:27 AM By: [name] MD FACS Entered By: [name] on 01/29/2022 08:28:12 -------------------------------------------------------------------------------- SuperBill Details Patient Name: Date of Service: [name], [name] 01/29/2022 Medical Record Number: [id number] Patient Account Number: [id number] Date of Birth/Sex: Treating RN: [birth year] (52 y.o. [name] Primary Care Provider: [name] Other Clinician: Referring Provider: Treating Provider/Extender: [name] in Treatment: 29 Diagnosis Coding ICD-10 Codes Code Description [phone] Non-pressure chronic ulcer of other part of right foot with necrosis of bone E11.49 Type 2 diabetes mellitus with other diabetic neurological complication P71.062 Other chronic osteomyelitis, right ankle and foot Facility Procedures : CPT4 Code: [id number] Description: 70350 - DEBRIDE WOUND 1ST 20 SQ CM OR < ICD-10 Diagnosis Description L97.514 Non-pressure chronic ulcer of other part of right foot with necrosis of bone Modifier: Quantity: 1 Physician Procedures : CPT4 Code Description Modifier [id number] - WC PHYS LEVEL 4 - EST PT 25 ICD-10 Diagnosis Description L97.514 Non-pressure chronic ulcer of other part of right foot with necrosis of  bone E11.49 Type 2 diabetes mellitus with other diabetic  neurological complication I96.789 Other chronic osteomyelitis, right ankle and foot Quantity: 1 : [id number] 10258 - WC PHYS DEBR WO ANESTH 20 SQ CM ICD-10 Diagnosis Description L97.514 Non-pressure chronic ulcer of other part of right foot with necrosis of bone Quantity: 1 Electronic Signature(s) Signed: 01/29/2022 8:29:55 AM By: [name] MD FACS Entered By: [name] on 01/29/2022 08:29:54

## 2022-02-04 ENCOUNTER — Encounter (HOSPITAL_BASED_OUTPATIENT_CLINIC_OR_DEPARTMENT_OTHER): Payer: BLUE CROSS/BLUE SHIELD | Admitting: General Surgery

## 2022-02-04 DIAGNOSIS — E11621 Type 2 diabetes mellitus with foot ulcer: Secondary | ICD-10-CM | POA: Diagnosis not present

## 2022-02-04 NOTE — Progress Notes (Signed)
[name], [name] ([id number]) [id number] Page 1 of 7 Visit Report for 02/04/2022 Arrival Information Details Patient Name: Date of Service: [name], [name] 02/04/2022 7:45 [name] Medical Record Number: [id number] Patient Account Number: [id number] Date of Birth/Sex: Treating RN: 1970-02-16 (52 y.o. [name] Primary Care [name]: [name] Other Clinician: Referring [name]: Treating [name]/Extender: [name]: 53 Visit Information History Since Last Visit Added or deleted any medications: No Patient Arrived: Ambulatory Any new allergies or adverse reactions: No Arrival Time: 07:40 Had a fall or experienced change [name] No Accompanied By: self activities of daily living that may affect Transfer Assistance: None risk of falls: Patient Identification Verified: Yes Signs or symptoms of abuse/neglect since last visito No Secondary Verification Process Completed: Yes Hospitalized since last visit: No Patient Requires Transmission-Based Precautions: No Implantable device outside of the clinic excluding No Patient Has Alerts: Yes cellular tissue based products placed [name] the center since last visit: Has Dressing [name] Place as Prescribed: Yes Pain Present Now: No Electronic Signature(s) Signed: 02/04/2022 4:26:59 PM By: [name] Gouty RN, BSN Entered By: [name] on 02/04/2022 07:41:53 -------------------------------------------------------------------------------- Encounter Discharge Information Details Patient Name: Date of Service: [name], [name] 02/04/2022 7:45 [name] Medical Record Number: [id number] Patient Account Number: [id number] Date of Birth/Sex: Treating RN: [birth year] (52 y.o. [name] Primary Care [name]: [name] Other Clinician: Referring [name]: Treating [name]/Extender: [name]: 30 Encounter Discharge Information  Items Post Procedure Vitals Discharge Condition: Stable Temperature (F): 98.3 Ambulatory Status: Ambulatory Pulse (bpm): 76 Discharge Destination: Home Respiratory Rate (breaths/min): 18 Transportation: Private Auto Blood Pressure (mmHg): 162/88 Accompanied By: self Schedule Follow-up Appointment: Yes Clinical Summary of Care: Patient Declined Electronic Signature(s) Signed: 02/04/2022 4:26:59 PM By: [name] Gouty RN, BSN Entered By: [name] on 02/04/2022 08:16:45 [name], [name] ([id number]) [id number] Page 2 of 7 -------------------------------------------------------------------------------- Lower Extremity Assessment Details Patient Name: Date of Service: [name], [name] 02/04/2022 7:45 [name] Medical Record Number: [id number] Patient Account Number: [id number] Date of Birth/Sex: Treating RN: 1970/12/26 (52 y.o. [name] Primary Care [name]: [name] Other Clinician: Referring [name]: Treating Amilah Greenspan/Extender: [name]: 30 Edema Assessment Assessed: [name]: No] [Right: No] Edema: [Left: N] [Right: o] Calf Left: Right: Point of Measurement: From Medial Instep 38 cm Ankle Left: Right: Point of Measurement: From Medial Instep 23 cm Vascular Assessment Pulses: Dorsalis Pedis Palpable: [Right:No] Electronic Signature(s) Signed: 02/04/2022 4:26:59 PM By: [name] Gouty RN, BSN Entered By: [name] on 02/04/2022 07:46:35 -------------------------------------------------------------------------------- Multi Wound Chart Details Patient Name: Date of Service: [name], [name] 02/04/2022 7:45 [name] Medical Record Number: [id number] Patient Account Number: [id number] Date of Birth/Sex: Treating RN: 1970/07/13 (53 y.o. [name] Primary Care [name]: [name] Other Clinician: Referring [name]: Treating [name]/Extender: [name]  Treatment: 30 Vital Signs Height([name]: 72 Capillary Blood Glucose(mg/dl): 113 Weight(lbs): 312 Pulse(bpm): 76 Body Mass Index(BMI): 42.3 Blood Pressure(mmHg): 162/88 Temperature(F): 98.3 Respiratory Rate(breaths/min): 18 [2:Photos:] [N/A:N/A] Right, Lateral, Plantar Foot N/A N/A Wound Location: Gradually Appeared N/A N/A Wounding Event: Diabetic Wound/Ulcer of the Lower N/A N/A Primary Etiology: Extremity Type II Diabetes N/A N/A Comorbid History: 12/19/2016 N/A N/A Date Acquired: 30 N/A N/A Weeks of Treatment: Open N/A N/A Wound Status: No N/A N/A Wound Recurrence: 0.1x1x1.2 N/A N/A Measurements L x W x D (cm) 0.079 N/A N/A A (cm) : rea 0.094 N/A N/A Volume (cm) :  97.80% N/A N/A % Reduction [name] A rea: 99.50% N/A N/A % Reduction [name] Volume: 12 Starting Position 1 (o'clock): 12 Ending Position 1 (o'clock): 0.4 Maximum Distance 1 (cm): Yes N/A N/A Undermining: Grade 3 N/A N/A Classification: Medium N/A N/A Exudate A mount: Serosanguineous N/A N/A Exudate Type: red, brown N/A N/A Exudate Color: Thickened N/A N/A Wound Margin: Large (67-100%) N/A N/A Granulation A mount: Red N/A N/A Granulation Quality: None Present (0%) N/A N/A Necrotic A mount: Fat Layer (Subcutaneous Tissue): Yes N/A N/A Exposed Structures: Fascia: No Tendon: No Muscle: No Joint: No Bone: No Small (1-33%) N/A N/A Epithelialization: Debridement - Selective/Open Wound N/A N/A Debridement: Pre-procedure Verification/Time Out 07:55 N/A N/A Taken: Lidocaine 5% topical ointment N/A N/A Pain Control: Callus N/A N/A Tissue Debrided: Skin/Epidermis N/A N/A Level: 1 N/A N/A Debridement A (sq cm): rea Curette N/A N/A Instrument: Minimum N/A N/A Bleeding: Pressure N/A N/A Hemostasis A chieved: 0 N/A N/A Procedural Pain: 0 N/A N/A Post Procedural Pain: Procedure was tolerated well N/A N/A Debridement Treatment Response: 0.2x1x1.2 N/A N/A Post Debridement  Measurements L x W x D (cm) 0.188 N/A N/A Post Debridement Volume: (cm) Callus: Yes N/A N/A Periwound Skin Texture: No Abnormalities Noted N/A N/A Periwound Skin Moisture: No Abnormalities Noted N/A N/A Periwound Skin Color: No Abnormality N/A N/A Temperature: Debridement N/A N/A Procedures Performed: Treatment Notes Electronic Signature(s) Signed: 02/04/2022 8:01:06 AM By: [name] MD FACS Entered By: [name] on 02/04/2022 08:01:06 -------------------------------------------------------------------------------- Multi-Disciplinary Care Plan Details Patient Name: Date of Service: [name], [name] 02/04/2022 7:45 [name] Medical Record Number: [id number] Patient Account Number: [id number] Date of Birth/Sex: Treating RN: February 28, 1970 (52 y.o. [name] Primary Care Brady Plant: [name] Other Clinician: Referring Happy Begeman: Treating Judi Jaffe/Extender: [name]: Dillon reviewed with physician [name], [name] ([id number]) [id number] Page 4 of 7 Active Inactive Nutrition Nursing Diagnoses: Impaired glucose control: actual or potential Potential for alteratiion [name] Nutrition/Potential for imbalanced nutrition Goals: Patient/caregiver will maintain therapeutic glucose control Date Initiated: 07/29/2021 Target Resolution Date: 02/21/2022 Goal Status: Active Interventions: Assess patient nutrition upon admission and as needed per policy Provide education on elevated blood sugars and impact on wound healing Treatment Activities: Patient referred to Primary Care Physician for further nutritional evaluation : 07/29/2021 Notes: Osteomyelitis Nursing Diagnoses: Infection: osteomyelitis Knowledge deficit related to disease process and management Goals: Patient/caregiver will verbalize understanding of disease process and disease management Date Initiated: 07/29/2021 Date  Inactivated: 11/12/2021 Target Resolution Date: 11/19/2021 Goal Status: Met Patient's osteomyelitis will resolve Date Initiated: 07/29/2021 Target Resolution Date: 02/21/2022 Goal Status: Active Interventions: Assess for signs and symptoms of osteomyelitis resolution every visit Provide education on osteomyelitis Treatment Activities: Consult for HBO : 07/29/2021 Notes: Electronic Signature(s) Signed: 02/04/2022 4:26:59 PM By: [name] Gouty RN, BSN Entered By: [name] on 02/04/2022 07:51:02 -------------------------------------------------------------------------------- Pain Assessment Details Patient Name: Date of Service: [name], [name] 02/04/2022 7:45 [name] Medical Record Number: [id number] Patient Account Number: [id number] Date of Birth/Sex: Treating RN: 04-04-1970 (52 y.o. [name] Primary Care Estrella Alcaraz: [name] Other Clinician: Referring Indica Marcott: Treating Paulanthony Gleaves/Extender: [name]: 30 Active Problems Location of Pain Severity and Description of Pain Patient Has Paino No Site Locations Rate the pain. [name], [name] ([id number]) [id number] Page 5 of 7 Rate the pain. Current Pain Level: 0 Pain Management and Medication Current Pain Management: Electronic Signature(s) Signed: 02/04/2022 4:26:59 PM By: [name] Gouty RN, BSN Entered By:  [name] on 02/04/2022 07:42:40 -------------------------------------------------------------------------------- Patient/Caregiver Education Details Patient Name: Date of Service: [name], [name] 1/16/2024andnbsp7:45 [name] Medical Record Number: [id number] Patient Account Number: [id number] Date of Birth/Gender: Treating RN: [birth year] (52 y.o. [name] Primary Care Physician: [name] Other Clinician: Referring Physician: Treating Physician/Extender: [name]:  30 Education Assessment Education Provided To: Patient Education Topics Provided Offloading: Methods: Explain/Verbal Responses: Reinforcements needed, State content correctly Wound/Skin Impairment: Methods: Explain/Verbal Responses: Reinforcements needed, State content correctly Electronic Signature(s) Signed: 02/04/2022 4:26:59 PM By: [name] Gouty RN, BSN Entered By: [name] on 02/04/2022 07:51:28 -------------------------------------------------------------------------------- Wound Assessment Details Patient Name: Date of Service: [name], [name] 02/04/2022 7:45 [name] [name], [name] ([id number]) [id number] Page 6 of 7 Medical Record Number: [id number] Patient Account Number: [id number] Date of Birth/Sex: Treating RN: 11/08/1970 (52 y.o. [name] Primary Care Niobe Dick: [name] Other Clinician: Referring Janyla Biscoe: Treating Marks Scalera/Extender: [name]: 30 Wound Status Wound Number: 2 Primary Etiology: Diabetic Wound/Ulcer of the Lower Extremity Wound Location: Right, Lateral, Plantar Foot Wound Status: Open Wounding Event: Gradually Appeared Comorbid History: Type II Diabetes Date Acquired: 12/19/2016 Weeks Of Treatment: 30 Clustered Wound: No Photos Wound Measurements Length: (cm) 0.1 Width: (cm) 1 Depth: (cm) 1.2 Area: (cm) 0.079 Volume: (cm) 0.094 % Reduction [name] Area: 97.8% % Reduction [name] Volume: 99.5% Epithelialization: Small (1-33%) Tunneling: No Undermining: Yes Starting Position (o'clock): 12 Ending Position (o'clock): 12 Maximum Distance: (cm) 0.4 Wound Description Classification: Grade 3 Wound Margin: Thickened Exudate Amount: Medium Exudate Type: Serosanguineous Exudate Color: red, brown Foul Odor After Cleansing: No Slough/Fibrino No Wound Bed Granulation Amount: Large (67-100%) Exposed Structure Granulation Quality: Red Fascia Exposed:  No Necrotic Amount: None Present (0%) Fat Layer (Subcutaneous Tissue) Exposed: Yes Tendon Exposed: No Muscle Exposed: No Joint Exposed: No Bone Exposed: No Periwound Skin Texture Texture Color No Abnormalities Noted: No No Abnormalities Noted: Yes Callus: Yes Temperature / Pain Temperature: No Abnormality Moisture No Abnormalities Noted: Yes Treatment Notes Wound #2 (Foot) Wound Laterality: Plantar, Right, Lateral Cleanser Soap and Water Discharge Instruction: May shower and wash wound with dial antibacterial soap and water prior to dressing change. Wound Cleanser Discharge Instruction: Cleanse the wound with wound cleanser prior to applying a clean dressing using gauze sponges, not tissue or cotton balls. Peri-Wound Care [name], [name] ([id number]) [id number] Page 7 of 7 cotton tipped applicators Topical Primary Dressing Iodoform packing strip 1/4 ([name] Discharge Instruction: Lightly pack as instructed Secondary Dressing Woven Gauze Sponge, Non-Sterile 4x4 [name] Discharge Instruction: Apply over primary dressing as directed. Secured With Paper Tape, 2x10 ([name]/yd) Discharge Instruction: Secure dressing with tape as directed. Compression Wrap Kerlix Roll 4.5x3.1 ([name]/yd) Discharge Instruction: Apply Kerlix and Coban compression as directed. Compression Stockings Add-Ons Electronic Signature(s) Signed: 02/04/2022 4:26:59 PM By: [name] Gouty RN, BSN Entered By: [name] on 02/04/2022 07:50:30 -------------------------------------------------------------------------------- Vitals Details Patient Name: Date of Service: [name], [name] 02/04/2022 7:45 [name] Medical Record Number: [id number] Patient Account Number: [id number] Date of Birth/Sex: Treating RN: [birth year] (52 y.o. [name] Primary Care Pinchus Weckwerth: [name] Other Clinician: Referring Kambria Grima: Treating Babette Stum/Extender: [name]  Treatment: 30 Vital Signs Time Taken: 07:41 Temperature (F): 98.3 Height ([name]: 72 Pulse (bpm): 76 Weight (lbs): 312 Respiratory Rate (breaths/min): 18 Body Mass Index (BMI): 42.3 Blood Pressure (mmHg): 162/88 Capillary Blood Glucose (mg/dl): 113 Reference Range: 80 - 120 mg / dl Electronic Signature(s) Signed:  02/04/2022 4:26:59 PM By: [name] Gouty RN, BSN Entered By: [name] on 02/04/2022 07:42:32

## 2022-02-04 NOTE — Progress Notes (Signed)
[name], [name] ([id number]) [id number] Page 1 of 10 Visit Report for 02/04/2022 Chief Complaint Document Details Patient Name: Date of Service: [name], [place] [name] 02/04/2022 7:45 [name] Medical Record Number: [id number] Patient Account Number: [id number] Date of Birth/Sex: Treating RN: 09/18/1970 (52 y.o. [name] Primary Care Provider: [name] Other Clinician: Referring Provider: Treating Provider/Extender: [name] in Treatment: 30 Information Obtained from: Patient Chief Complaint Patients presents for treatment of an open diabetic ulcer Electronic Signature(s) Signed: 02/04/2022 8:01:14 AM By: [name] MD FACS Entered By: [name] on 02/04/2022 08:01:14 -------------------------------------------------------------------------------- Debridement Details Patient Name: Date of Service: [name], [name] 02/04/2022 7:45 [name] Medical Record Number: [id number] Patient Account Number: [id number] Date of Birth/Sex: Treating RN: [birth year] (52 y.o. [name] Primary Care Provider: [name] Other Clinician: Referring Provider: Treating Provider/Extender: [name] in Treatment: 30 Debridement Performed for Assessment: Wound #2 [place] Performed By: Physician [name], MD Debridement Type: Debridement Severity of Tissue Pre Debridement: Fat layer exposed Level of Consciousness (Pre-procedure): Awake and Alert Pre-procedure Verification/Time Out Yes - 07:55 Taken: Start Time: 07:56 Pain Control: Lidocaine 5% topical ointment [name] Area Debrided (L x W): otal 1 (cm) x 1 (cm) = 1 (cm) Tissue and other material debrided: Non-Viable, Callus, Skin: Epidermis Level: Skin/Epidermis Debridement Description: Selective/Open Wound Instrument: Curette Bleeding: Minimum Hemostasis Achieved: Pressure Procedural Pain: 0 Post Procedural Pain: 0 Response to  Treatment: Procedure was tolerated well Level of Consciousness (Post- Awake and Alert procedure): Post Debridement Measurements of Total Wound Length: (cm) 0.2 Width: (cm) 1 Depth: (cm) 1.2 Volume: (cm) 0.188 Character of Wound/Ulcer Post Debridement: Improved Severity of Tissue Post Debridement: Fat layer exposed [name], [name] ([id number]) [id number] Page 2 of 10 Post Procedure Diagnosis Same as Pre-procedure Notes scribed by [name], RN for [name] Electronic Signature(s) Signed: 02/04/2022 11:02:56 AM By: [name] MD FACS Signed: 02/04/2022 4:26:59 PM By: [name] RN, BSN Entered By: [name] on 02/04/2022 08:17:35 -------------------------------------------------------------------------------- HPI Details Patient Name: Date of Service: [name], [name] 02/04/2022 7:45 [name] Medical Record Number: [id number] Patient Account Number: [id number] Date of Birth/Sex: Treating RN: [birth year] (52 y.o. 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 The most recent amputation was in August 2022. At that operation, antibiotic beads were placed in the wound. He has been managed by podiatry for his procedures and management of his wounds. He has been in a Water engineer. He is on oral doxycycline. They have been using Betadine wet to dry dressings along with Iodosorb. The patient states that when he thinks the wound is getting too dry, he applies topical Neosporin. At his last visit, on June 7 of this year, the podiatrist determined that he felt the wound was stalled and  referred him to wound care for additional evaluation and management. An MRI has been ordered, but not yet scheduled or performed. Pathology from his operation in August 2022 demonstrated findings consistent with chronic osteomyelitis. Today, there is a large irregular wound on the plantar surface of his right foot, at about the level of the fifth metatarsal head. This tracks through to a pinpoint opening on  the dorsal lateral portion of his foot. The intake nurse reported purulent drainage. There is some malodor from the wound. No frank necrosis identified. 07/11/2021: Today, the wounds do not connect. I attempted multiple times from various directions and the shared tunnel is no longer open. He has some slough accumulation on the dorsal part of his foot as well as slough and callus buildup on the plantar surface. His MRI is scheduled for June 29. No purulent drainage or malodor appreciated today. 07/19/2021: The lateral foot wound has closed and there is no tunnel connecting the plantar foot wound to that site. The plantar foot wound still probes quite deeply, however. There is some slough, eschar, and nonviable tissue accumulated in the wound bed. No malodorous drainage present. His MRI was performed yesterday and is consistent with fairly extensive osteomyelitis. 07/29/2021: The patient has an appointment with infectious disease on July 18 to treat his osteomyelitis. The plantar wound still probes quite deeply, approaching bone. The orifice has narrowed quite substantially, however, making it more difficult for him to pack. 08/05/2021: The tunnel connecting the lateral foot wound to the plantar foot wound has reopened. He sees infectious disease tomorrow to discuss long-term antibiotic treatment for osteomyelitis. There was a bit of murky drainage in the wound, but this was noted after he had had topical lidocaine applied so may have just been a blob of the anesthetic. No odor or frank purulent  drainage. The wound probes deeply at the midfoot approaching bone. He does have MRI results confirming his diagnosis of osteomyelitis. He has failed to progress with conventional treatment and I think his best chance for preservation of the foot is to initiate hyperbaric oxygen therapy. 08/13/2021: The tunnel connecting the 2 wounds has closed again. He has a PICC line and is getting IV daptomycin and cefepime. EKG and chest x-ray are within normal limits. He is scheduled to start hyperbaric oxygen therapy tomorrow. The wound in his midfoot probes deeply, approaching bone. The skin at the orifice continues to heaped up and threatens to close over despite the large cavity within. No erythema, induration, or purulent drainage. The wound on his lateral foot is fairly small and quite clean. 08/19/2021: The lateral foot wound has nearly closed. The wound in his midfoot does not probe quite as deeply today. The skin at the orifice continues to try to roll in and obscure the opening. No frankly necrotic tissue appreciated. He is tolerating hyperbaric oxygen therapy. 08/26/2021: The lateral foot wound has closed completely. The wound in his midfoot is shallower again today. The wound orifice is contracting. We are using gentamicin and silver alginate. He continues on IV daptomycin and cefepime and is tolerating hyperbaric oxygen therapy without difficulty. 09/02/2021: His foot is a little bit sore today but he was up walking on it all weekend doing back to school shopping with his daughter. The tunneling is down to about 3 cm and I do not appreciate bone at the tip of the probe. The wound is clean but has some callus creating an overhanging lip at the distal aspect. He continues to receive hyperbaric oxygen therapy as well as IV daptomycin and cefepime. 09/09/2021: His wound continues to improve. The tunnel has come in by 0.9 cm. He continues to form callus around the orifice of the wound. He is  tolerating hyperbaric oxygen therapy along with his IV antibiotics. 09/16/2021: The tunneling is down to just half a centimeter. He continues to build up callus around the orifice of the wound.  He completed his course of IV antibiotics and his PICC line is scheduled to be removed this Friday. He is tolerating hyperbaric oxygen without difficulty. We have been applying topical gentamicin and silver alginate to his wound. [name], [name] ([id number]) [id number] Page 3 of 10 09/24/2021: The wound depth has come in even further. There is a little bit of callus buildup around the orifice. The opening is quite narrow, at this point. 09/30/2021: The wound depth is about the same this week. The opening continues to contract with callus accumulation. There is some discoloration of the skin on the lateral part of his foot and he admitted to walking more than usual this weekend and also wearing a different pair shoes. He continues to tolerate hyperbaric oxygen therapy without difficulty. 10/07/2021: The wound depth has contracted a bit and is now approximately 1 to 1.5 cm. The orifice of the wound continues to try and close in over the space. There is just some callus and skin around the margins obscuring the orifice. 10/14/2021: The wound depth has come in a little bit more. The orifice of the wound is rolling inwards with epithelium and callus, making it difficult to pack the wound. 10/21/2021: There is still 1 portion of the wound, at the lateral aspect, that is still a couple of centimeters deep. The architecture of the wound makes this somewhat challenging to access and pack. Everything else looks clean. He has his usual accumulation of callus and skin, narrowing the orifice. 10/29/2021: No significant change to the depth of the wound. The orifice continues to contract with callus and skin narrowing the opening. His wife has been packing the wound and doing an excellent  job. 11/05/2021: The depth of the wound has come in by about a centimeter. There is less callus accumulation around the orificedespite this, the wound is narrower today. We are still awaiting insurance approval for Kerecis powder. 11/12/2021: The depth of the wound continues to contract. He has accumulated some callus around the orifice, as usual. We have received a verbal confirmation that he is approved for Kerecis, but no formal paperwork has yet been received. 11/19/2021: The depth continues to come in. There is callus accumulation around the orifice, as usual. He has been formally approved for Kerecis, but unfortunately we do not have a billing mechanism for the powder in [employer] yet. We are working to address this so we can begin treatment. 11/25/2021: Continued filling in of the depth of the wound. Continued buildup of callus around the orifice. No concern for infection. As we do not have a way to bill for Kerecis powder, but are capable of billing for the [facility] sheet, we are going to use that on him instead. 12/03/2021: The depth of the wound continues to fill in. As usual, he has accumulated callus and skin around the orifice of the wound. No concern for infection. He will complete his hyperbaric oxygen therapy this week. 12/17/2021: The wound depth continues to contract. There is callus and skin accumulation around the orifice of the wound, as per usual. There is more epithelium on the actual wound surface. Unfortunately, his Leroy Kennedy was not ordered. 12/24/2021: The wound depth has come in a little bit more this week. There is less skin accumulation around the orifice, but still some callus. On questioning, he admits to spending a fair amount of time on his feet. 12/12; this is a very difficult wound to assess size slitlike wound with some depth but minimal opening. I applied Kerecis No.  4 12/18; Kerecis No. 5 01/15/2022: His wound measured deeper today. There is also some evidence of  pressure induced tissue injury around the lateral aspect of his foot. The patient reports that he was wearing different shoes than usual and felt like his foot was rolling a lot over the weekend. There is heavy callus around the orifice of the wound. No concern for infection. 01/21/2022: The depth decreased today from 1.7 to 1.0 cm. Significantly less tissue damage as compared to last week. Still with callus and skin accumulation around the orifice. 01/29/2022: The wound continues to contract and the depth is coming in further. We are still working on getting [employer] for him. 02/04/2022: No real change this week. Still working on the [employer]. Engineer, maintenance) Signed: 02/04/2022 8:04:56 AM By: [name] MD FACS Entered By: [name] on 02/04/2022 08:04:56 -------------------------------------------------------------------------------- Physical Exam Details Patient Name: Date of Service: [name], [name] 02/04/2022 7:45 [name] Medical Record Number: [id number] Patient Account Number: [id number] Date of Birth/Sex: Treating RN: 09-15-1970 (52 y.o. [name] Primary Care Provider: [name] Other Clinician: Referring Provider: Treating Provider/Extender: [name] in Treatment: 38 Constitutional Hypertensive, asymptomatic. . . . no acute distress. Respiratory Normal work of breathing on room air. Notes [name], [name] ([id number]) [id number] Page 4 of 10 02/04/2022: No real change this week. Electronic Signature(s) Signed: 02/04/2022 8:05:27 AM By: [name] MD FACS Entered By: [name] on 02/04/2022 08:05:27 -------------------------------------------------------------------------------- Physician Orders Details Patient Name: Date of Service: [name], [name] 02/04/2022 7:45 [name] Medical Record Number: [id number] Patient Account Number: [id number] Date of Birth/Sex: Treating  RN: 08-Apr-1970 (52 y.o. 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 - Dr Celine Ahr RM 1 Return A Tuesday 1/23 @ 0730 am Anesthetic Wound #2 Right,Lateral,Plantar Foot (In clinic) Topical Lidocaine 5% applied to wound bed Cellular or Tissue Based Products Cellular or Tissue Based Product Type: - Kerecis #6 01/29/22- HOLD KERICIS APPLICATION- insurance denied, [employer] is working on getting pt approved. Bathing/ Shower/ Hygiene May shower and wash wound with soap and water. Off-Loading Other: - Try to not stand on your feet too much Wound Treatment Wound #2 - Foot Wound Laterality: Plantar, Right, Lateral Cleanser: Soap and Water 1 x Per Day/15 Days Discharge Instructions: May shower and wash wound with dial antibacterial soap and water prior to dressing change. Cleanser: Wound Cleanser (Generic) 1 x Per Day/15 Days Discharge Instructions: Cleanse the wound with wound cleanser prior to applying a clean dressing using gauze sponges, not tissue or cotton balls. Peri-Wound Care: cotton tipped applicators (Generic) 1 x Per Day/15 Days Prim Dressing: Iodoform packing strip 1/4 (in) 1 x Per Day/15 Days ary Discharge Instructions: Lightly pack as instructed Secondary Dressing: Woven Gauze Sponge, Non-Sterile 4x4 in 1 x Per Day/15 Days Discharge Instructions: Apply over primary dressing as directed. Secured With: Paper Tape, 2x10 (in/yd) (Generic) 1 x Per Day/15 Days Discharge Instructions: Secure dressing with tape as  directed. Compression Wrap: Kerlix Roll 4.5x3.1 (in/yd) (Generic) 1 x Per Day/15 Days Discharge Instructions: Apply Kerlix and Coban compression as directed. Patient Medications [name], [name] ([id number]) [id number] Page 5 of 10 llergies: No Known Allergies A Notifications Medication Indication Start End 02/04/2022 lidocaine DOSE topical 5 % ointment - ointment topical prior to debridement Electronic Signature(s) Signed: 02/04/2022 11:02:56 AM By: [name] MD FACS Entered By: [name] on 02/04/2022 08:06:15 -------------------------------------------------------------------------------- Problem List Details Patient Name: Date of Service: [name], [name] 02/04/2022 7:45 [name] Medical Record Number: [id number] Patient Account Number: [id number] Date of Birth/Sex: Treating RN: 1970/02/03 (52 y.o. 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 02/04/2022 7:45 [name] Medical Record Number: [id number] Patient Account Number: [id number] Date of Birth/Sex: Treating RN: 1970-10-11 (52 y.o. 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 The most recent amputation was in August 2022. At that operation, antibiotic beads were placed in the wound. He has been managed by podiatry for his procedures and management of his wounds. He has been in a Water engineer. He is on oral doxycycline. They have been using Betadine wet to dry dressings along with Iodosorb. The patient states that when he thinks the wound is getting too dry, he applies topical Neosporin. At his last visit, on June 7 of this year, the podiatrist determined that he felt the wound was stalled and referred him to wound care for additional evaluation and management. An MRI has been ordered, but not yet scheduled or performed. Pathology from his operation in August 2022 demonstrated findings consistent with chronic osteomyelitis. Today, there is a large irregular wound on the plantar surface of his right foot, at about the level of the fifth metatarsal head. This tracks through to a pinpoint opening on the dorsal lateral portion of his foot. The intake nurse reported purulent drainage. There is some malodor from the wound. No frank necrosis identified. 07/11/2021: Today, the wounds do not connect. I attempted multiple times from various directions and the shared tunnel is no longer open. He has some slough accumulation on the dorsal part of his foot as well as slough and callus buildup on the plantar surface. His MRI is  scheduled for June 29. No purulent drainage or malodor appreciated today. 07/19/2021: The lateral foot wound has closed and there is no tunnel connecting the plantar foot wound to that site. The plantar foot wound still probes quite deeply, however. There is some slough, eschar, and nonviable tissue accumulated in the wound bed. No malodorous drainage present. His MRI was performed yesterday and is consistent with fairly extensive osteomyelitis. 07/29/2021: The  patient has an appointment with infectious disease on July 18 to treat his osteomyelitis. The plantar wound still probes quite deeply, approaching bone. The orifice has narrowed quite substantially, however, making it more difficult for him to pack. 08/05/2021: The tunnel connecting the lateral foot wound to the plantar foot wound has reopened. He sees infectious disease tomorrow to discuss long-term antibiotic treatment for osteomyelitis. There was a bit of murky drainage in the wound, but this was noted after he had had topical lidocaine applied so may have just been a blob of the anesthetic. No odor or frank purulent drainage. The wound probes deeply at the midfoot approaching bone. He does have MRI results confirming his diagnosis of osteomyelitis. He has failed to progress with conventional treatment and I think his best chance for preservation of the foot is to initiate hyperbaric oxygen therapy. 08/13/2021: The tunnel connecting the 2 wounds has closed again. He has a PICC line and is getting IV daptomycin and cefepime. EKG and chest x-ray are within normal limits. He is scheduled to start hyperbaric oxygen therapy tomorrow. The wound in his midfoot probes deeply, approaching bone. The skin at the orifice continues to heaped up and threatens to close over despite the large cavity within. No erythema, induration, or purulent drainage. The wound on his lateral foot is fairly small and quite clean. 08/19/2021: The lateral foot wound has nearly  closed. The wound in his midfoot does not probe quite as deeply today. The skin at the orifice continues to try to roll in and obscure the opening. No frankly necrotic tissue appreciated. He is tolerating hyperbaric oxygen therapy. 08/26/2021: The lateral foot wound has closed completely. The wound in his midfoot is shallower again today. The wound orifice is contracting. We are using gentamicin and silver alginate. He continues on IV daptomycin and cefepime and is tolerating hyperbaric oxygen therapy without difficulty. 09/02/2021: His foot is a little bit sore today but he was up walking on it all weekend doing back to school shopping with his daughter. The tunneling is down to about 3 cm and I do not appreciate bone at the tip of the probe. The wound is clean but has some callus creating an overhanging lip at the distal aspect. He continues to receive hyperbaric oxygen therapy as well as IV daptomycin and cefepime. 09/09/2021: His wound continues to improve. The tunnel has come in by 0.9 cm. He continues to form callus around the orifice of the wound. He is tolerating hyperbaric oxygen therapy along with his IV antibiotics. 09/16/2021: The tunneling is down to just half a centimeter. He continues to build up callus around the orifice of the wound. He completed his course of IV antibiotics and his PICC line is scheduled to be removed this Friday. He is tolerating hyperbaric oxygen without difficulty. We have been applying topical gentamicin and silver alginate to his wound. 09/24/2021: The wound depth has come in even further. There is a little bit of callus buildup around the orifice. The opening is quite narrow, at this point. 09/30/2021: The wound depth is about the same this week. The opening continues to contract with callus accumulation. There is some discoloration of the skin on the lateral part of his foot and he admitted to walking more than usual this weekend and also wearing a different pair  shoes. He continues to tolerate hyperbaric oxygen therapy without difficulty. 10/07/2021: The wound depth has contracted a bit and is now approximately 1 to 1.5 cm. The orifice of the  wound continues to try and close in over the space. There is just some callus and skin around the margins obscuring the orifice. 10/14/2021: The wound depth has come in a little bit more. The orifice of the wound is rolling inwards with epithelium and callus, making it difficult to pack the wound. 10/21/2021: There is still 1 portion of the wound, at the lateral aspect, that is still a couple of centimeters deep. The architecture of the wound makes this somewhat challenging to access and pack. Everything else looks clean. He has his usual accumulation of callus and skin, narrowing the orifice. 10/29/2021: No significant change to the depth of the wound. The orifice continues to contract with callus and skin narrowing the opening. His wife has been packing the wound and doing an excellent job. 11/05/2021: The depth of the wound has come in by about a centimeter. There is less callus accumulation around the orificeoodespite this, the wound is narrower today. We are still awaiting insurance approval for Kerecis powder. 11/12/2021: The depth of the wound continues to contract. He has accumulated some callus around the orifice, as usual. We have received a verbal confirmation that he is approved for Kerecis, but no formal paperwork has yet been received. 11/19/2021: The depth continues to come in. There is callus accumulation around the orifice, as usual. He has been formally approved for Kerecis, but unfortunately we do not have a billing mechanism for the powder in [facility] yet. We are working to address this so we can begin treatment. 11/25/2021: Continued filling in of the depth of the wound. Continued buildup of callus around the orifice. No concern for infection. As we do not have a way to bill for Kerecis powder, but are  capable of billing for the [facility] sheet, we are going to use that on him instead. 12/03/2021: The depth of the wound continues to fill in. As usual, he has accumulated callus and skin around the orifice of the wound. No concern for infection. He will complete his hyperbaric oxygen therapy this week. [name], [name] ([id number]) [id number] Page 7 of 10 12/17/2021: The wound depth continues to contract. There is callus and skin accumulation around the orifice of the wound, as per usual. There is more epithelium on the actual wound surface. Unfortunately, his Leroy Kennedy was not ordered. 12/24/2021: The wound depth has come in a little bit more this week. There is less skin accumulation around the orifice, but still some callus. On questioning, he admits to spending a fair amount of time on his feet. 12/12; this is a very difficult wound to assess size slitlike wound with some depth but minimal opening. I applied Kerecis No. 4 12/18; Kerecis No. 5 01/15/2022: His wound measured deeper today. There is also some evidence of pressure induced tissue injury around the lateral aspect of his foot. The patient reports that he was wearing different shoes than usual and felt like his foot was rolling a lot over the weekend. There is heavy callus around the orifice of the wound. No concern for infection. 01/21/2022: The depth decreased today from 1.7 to 1.0 cm. Significantly less tissue damage as compared to last week. Still with callus and skin accumulation around the orifice. 01/29/2022: The wound continues to contract and the depth is coming in further. We are still working on getting [employer] for him. 02/04/2022: No real change this week. Still working on the [employer]. Patient History Information obtained from Patient. Social History Never smoker, Marital Status - Married, Alcohol  Use - Never, Drug Use - No History, Caffeine Use - Daily - [name] coffee. ea; Medical  History Endocrine Patient has history of Type II Diabetes Hospitalization/Surgery History - Amuptation of 3rd,4th and 5th toes of Right foot;Oral Surgery;Anal Fissure surgery; Cholecystectomy. Objective Constitutional Hypertensive, asymptomatic. no acute distress. Vitals Time Taken: 7:41 AM, Height: 72 in, Weight: 312 lbs, BMI: 42.3, Temperature: 98.3 F, Pulse: 76 bpm, Respiratory Rate: 18 breaths/min, Blood Pressure: 162/88 mmHg, Capillary Blood Glucose: 113 mg/dl. Respiratory Normal work of breathing on room air. General Notes: 02/04/2022: No real change this week. Integumentary (Hair, Skin) Wound #2 status is Open. Original cause of wound was Gradually Appeared. The date acquired was: 12/19/2016. The wound has been in treatment 30 weeks. The wound is located on the [place]. The wound measures 0.1cm length x 1cm width x 1.2cm depth; 0.079cm^2 area and 0.094cm^3 volume. There is Fat Layer (Subcutaneous Tissue) exposed. There is no tunneling noted, however, there is undermining starting at 12:00 and ending at 12:00 with a maximum distance of 0.4cm. There is a medium amount of serosanguineous drainage noted. The wound margin is thickened. There is large (67-100%) red granulation within the wound bed. There is no necrotic tissue within the wound bed. The periwound skin appearance had no abnormalities noted for moisture. The periwound skin appearance had no abnormalities noted for color. The periwound skin appearance exhibited: Callus. Periwound temperature was noted as No Abnormality. 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 There was a Selective/Open Wound Skin/Epidermis Debridement with a total area of 1 sq cm performed by [name], MD. With the following instrument(s): Curette to remove Non-Viable tissue/material. Material removed includes Callus and Skin: Epidermis and after achieving pain control using Lidocaine 5% topical ointment. No specimens were taken. A time out was conducted at 07:55, prior to the start of the procedure. A Minimum amount of bleeding was controlled with Pressure. The procedure was tolerated well with a pain level of 0 throughout and a pain level of 0 following the procedure. Post Debridement Measurements: 0.2cm length x 1cm width x 1.2cm depth; 0.188cm^3 volume. Character of Wound/Ulcer Post Debridement is improved. Severity of Tissue Post Debridement is: Fat layer exposed. Post procedure Diagnosis Wound #2: Same as Pre-Procedure Plan Follow-up Appointments: Return Appointment in 1 week. - Dr Celine Ahr RM 1 Tuesday 1/23 @ 0730 am Anesthetic: Wound #2 Right,Lateral,Plantar Foot: (In clinic) Topical Lidocaine 5% applied to wound bed Cellular or Tissue Based Products: Cellular or Tissue Based Product Type: - Kerecis #6 01/29/22- [place]- insurance denied, [employer] is working on getting pt approved. Bathing/ Shower/ Hygiene: May shower and wash wound with soap and water. Off-Loading: Other: - Try to not stand on your feet too much The following medication(s) was prescribed: lidocaine topical 5 % ointment ointment topical prior to debridement was prescribed at facility WOUND #2: - Foot Wound Laterality: Plantar, Right, Lateral Cleanser: Soap and Water 1 x Per Day/15 Days Discharge Instructions: May shower and wash wound with dial antibacterial soap and water prior to dressing change. Cleanser: Wound Cleanser (Generic) 1 x Per Day/15  Days Discharge Instructions: Cleanse the  wound with wound cleanser prior to applying a clean dressing using gauze sponges, not tissue or cotton balls. Peri-Wound Care: cotton tipped applicators (Generic) 1 x Per Day/15 Days Prim Dressing: Iodoform packing strip 1/4 (in) 1 x Per Day/15 Days ary Discharge Instructions: Lightly pack as instructed Secondary Dressing: Woven Gauze Sponge, Non-Sterile 4x4 in 1 x Per Day/15 Days Discharge Instructions: Apply over primary dressing as directed. Secured With: Paper [name] ape, 2x10 (in/yd) (Generic) 1 x Per Day/15 Days Discharge Instructions: Secure dressing with tape as directed. Com pression Wrap: Kerlix Roll 4.5x3.1 (in/yd) (Generic) 1 x Per Day/15 Days Discharge Instructions: Apply Kerlix and Coban compression as directed. 02/04/2022: No real change this week. I used a curette to debride callus and skin from around the wound and to maintain the opening. It looks as though the epithelium is trying to creep down into the wound cavity itself. While we are awaiting our appeal for Kerecis, we will continue to pack the wound with iodoform packing strips. Follow-up in 1 week. Electronic Signature(s) Signed: 02/04/2022 8:07:10 AM By: [name] MD FACS Entered By: [name] on 02/04/2022 08:07:10 -------------------------------------------------------------------------------- HxROS Details Patient Name: Date of Service: [name], [name] 02/04/2022 7:45 [name] Medical Record Number: [id number] Patient Account Number: [id number] Date of Birth/Sex: Treating RN: 10/15/1970 (52 y.o. [name] Primary Care Provider: [name] Other Clinician: Referring Provider: Treating Provider/Extender: [name] in Treatment: 30 Information Obtained From Patient Endocrine Medical History: Positive for: Type II Diabetes [name], [name] ([id number]) [id number] Page 9 of 10 Immunizations Pneumococcal  Vaccine: Received Pneumococcal Vaccination: Yes Received Pneumococcal Vaccination On or After 60th Birthday: No Implantable Devices None Hospitalization / Surgery History Type of Hospitalization/Surgery Amuptation of 3rd,4th and 5th toes of Right foot;Oral Surgery;Anal Fissure surgery; Cholecystectomy Family and Social History Never smoker; Marital Status - Married; Alcohol Use: Never; Drug Use: No History; Caffeine Use: Daily - [name] coffee; Financial Concerns: No; Food, ea; Clothing or Shelter Needs: No; Support System Lacking: No; Transportation Concerns: No Electronic Signature(s) Signed: 02/04/2022 11:02:56 AM By: [name] MD FACS Entered By: [name] on 02/04/2022 08:05:03 -------------------------------------------------------------------------------- SuperBill Details Patient Name: Date of Service: [name], [name] 02/04/2022 Medical Record Number: [id number] Patient Account Number: [id number] Date of Birth/Sex: Treating RN: 10-Mar-1970 (52 y.o. [name] Primary Care Provider: [name] Other Clinician: Referring Provider: Treating Provider/Extender: [name] in Treatment: 30 Diagnosis Coding ICD-10 Codes Code Description [phone] Non-pressure chronic ulcer of other part of right foot with necrosis of bone E11.49 Type 2 diabetes mellitus with other diabetic neurological complication B35.329 Other chronic osteomyelitis, right ankle and foot Facility Procedures : CPT4 Code: [id number] Description: 19622 - DEBRIDE WOUND 1ST 20 SQ CM OR < ICD-10 Diagnosis Description L97.514 Non-pressure chronic ulcer of other part of right foot with necrosis of bone Modifier: Quantity: 1 Physician Procedures : CPT4 Code Description Modifier [id number] - WC PHYS LEVEL 4 - EST PT 25 ICD-10 Diagnosis Description L97.514 Non-pressure chronic ulcer of other part of right foot with necrosis of bone E11.49 Type 2 diabetes mellitus with other diabetic   neurological complication D40.814 Other chronic osteomyelitis, right ankle and foot Quantity: 1 : [id number] - WC PHYS DEBR WO ANESTH 20 SQ CM ICD-10 Diagnosis Description L97.514 Non-pressure chronic ulcer of other part of right foot with necrosis of bone Quantity: 1 Electronic Signature(s) Signed: 02/04/2022 8:07:25 AM By: [name] MD FACS [name], [name] ([id number]) [id number] Page 10 of 10 Entered By:  [name] on 02/04/2022 08:07:25

## 2022-02-11 ENCOUNTER — Encounter (HOSPITAL_BASED_OUTPATIENT_CLINIC_OR_DEPARTMENT_OTHER): Payer: BLUE CROSS/BLUE SHIELD | Admitting: General Surgery

## 2022-02-11 DIAGNOSIS — E11621 Type 2 diabetes mellitus with foot ulcer: Secondary | ICD-10-CM | POA: Diagnosis not present

## 2022-02-12 NOTE — Progress Notes (Signed)
[name], [name] ([id number]) [id number] Page 1 of 8 Visit Report for 02/11/2022 Arrival Information Details Patient Name: Date of Service: [name], [name] 02/11/2022 7:30 [name] Medical Record Number: [id number] Patient Account Number: [id number] Date of Birth/Sex: Treating RN: 09/20/1970 (52 y.o. [name] Primary Care Mervil Wacker: [name] Other Clinician: Referring Schon Zeiders: Treating Aidynn Krenn/Extender: [name]: 68 Visit Information History Since Last Visit Added or deleted any medications: No Patient Arrived: Ambulatory Any new allergies or adverse reactions: No Arrival Time: 07:42 Had a fall or experienced change [name] No Accompanied By: self activities of daily living that may affect Transfer Assistance: None risk of falls: Patient Identification Verified: Yes Signs or symptoms of abuse/neglect since last visito No Secondary Verification Process Completed: Yes Hospitalized since last visit: No Patient Requires Transmission-Based Precautions: No Implantable device outside of the clinic excluding No Patient Has Alerts: Yes cellular tissue based products placed [name] the center since last visit: Has Dressing [name] Place as Prescribed: Yes Pain Present Now: No Electronic Signature(s) Signed: 02/12/2022 5:16:51 PM By: [name] RN, BSN Entered By: [name] on 02/11/2022 07:45:41 -------------------------------------------------------------------------------- Encounter Discharge Information Details Patient Name: Date of Service: [name], [name] 02/11/2022 7:30 [name] Medical Record Number: [id number] Patient Account Number: [id number] Date of Birth/Sex: Treating RN: 11-Aug-1970 (52 y.o. [name] Primary Care Charrise Lardner: [name] Other Clinician: Referring Jermisha Hoffart: Treating Gavyn Zoss/Extender: [name]: 31 Encounter Discharge Information  Items Post Procedure Vitals Discharge Condition: Stable Temperature (F): 98.1 Ambulatory Status: Ambulatory Pulse (bpm): 69 Discharge Destination: Home Respiratory Rate (breaths/min): 18 Transportation: Private Auto Blood Pressure (mmHg): 169/93 Accompanied By: self Schedule Follow-up Appointment: Yes Clinical Summary of Care: Patient Declined Electronic Signature(s) Signed: 02/12/2022 5:16:51 PM By: [name] RN, BSN Entered By: [name] on 02/11/2022 08:19:57 [name], [name] ([id number]) [id number] Page 2 of 8 -------------------------------------------------------------------------------- Lower Extremity Assessment Details Patient Name: Date of Service: [name], [name] 02/11/2022 7:30 [name] Medical Record Number: [id number] Patient Account Number: [id number] Date of Birth/Sex: Treating RN: 11-Jul-1970 (52 y.o. [name] Primary Care Hargun Spurling: [name] Other Clinician: Referring Daesean Lazarz: Treating Adriyanna Christians/Extender: [name]: 31 Edema Assessment Assessed: [name]: No] [name]: No] Edema: [Left: N] [Right: o] Calf Left: Right: Point of Measurement: From Medial Instep 38 cm Ankle Left: Right: Point of Measurement: From Medial Instep 23 cm Vascular Assessment Pulses: Dorsalis Pedis Palpable: [Right:Yes] Electronic Signature(s) Signed: 02/12/2022 5:16:51 PM By: [name] RN, BSN Entered By: [name] on 02/11/2022 07:50:43 -------------------------------------------------------------------------------- Multi Wound Chart Details Patient Name: Date of Service: [name], [name] 02/11/2022 7:30 [name] Medical Record Number: [id number] Patient Account Number: [id number] Date of Birth/Sex: Treating RN: 26-Apr-1970 (52 y.o. [name] Primary Care Haruka Kowaleski: [name] Other Clinician: Referring Jacklyne Baik: Treating Aleric Froelich/Extender: [name]  Treatment: 31 Vital Signs Height([name]: 72 Capillary Blood Glucose(mg/dl): 90 Weight(lbs): 312 Pulse(bpm): 74 Body Mass Index(BMI): 42.3 Blood Pressure(mmHg): 169/93 Temperature(F): 98.1 Respiratory Rate(breaths/min): 18 [2:Photos:] [N/A:N/A] Right, Lateral, Plantar Foot N/A N/A Wound Location: Gradually Appeared N/A N/A Wounding Event: Diabetic Wound/Ulcer of the Lower N/A N/A Primary Etiology: Extremity Type II Diabetes N/A N/A Comorbid History: 12/19/2016 N/A N/A Date Acquired: 31 N/A N/A Weeks of Treatment: Open N/A N/A Wound Status: No N/A N/A Wound Recurrence: 0.2x1.2x1.2 N/A N/A Measurements L x W x D (cm) 0.188 N/A N/A A (cm) : rea 0.226 N/A N/A Volume (cm) :  94.80% N/A N/A % Reduction [name] A rea: 98.70% N/A N/A % Reduction [name] Volume: 3 Starting Position 1 (o'clock): 9 Ending Position 1 (o'clock): 0.9 Maximum Distance 1 (cm): Yes N/A N/A Undermining: Grade 3 N/A N/A Classification: Medium N/A N/A Exudate A mount: Serosanguineous N/A N/A Exudate Type: red, brown N/A N/A Exudate Color: Thickened N/A N/A Wound Margin: Large (67-100%) N/A N/A Granulation A mount: Red N/A N/A Granulation Quality: None Present (0%) N/A N/A Necrotic A mount: Fat Layer (Subcutaneous Tissue): Yes N/A N/A Exposed Structures: Fascia: No Tendon: No Muscle: No Joint: No Bone: No Small (1-33%) N/A N/A Epithelialization: Debridement - Excisional N/A N/A Debridement: Pre-procedure Verification/Time Out 08:00 N/A N/A Taken: Lidocaine 4% Topical Solution N/A N/A Pain Control: Callus, Subcutaneous N/A N/A Tissue Debrided: Skin/Subcutaneous Tissue N/A N/A Level: 0.6 N/A N/A Debridement A (sq cm): rea Curette N/A N/A Instrument: Minimum N/A N/A Bleeding: Pressure N/A N/A Hemostasis A chieved: 0 N/A N/A Procedural Pain: 0 N/A N/A Post Procedural Pain: Procedure was tolerated well N/A N/A Debridement Treatment Response: 0.2x1.2x1.2 N/A N/A Post  Debridement Measurements L x W x D (cm) 0.226 N/A N/A Post Debridement Volume: (cm) Callus: Yes N/A N/A Periwound Skin Texture: No Abnormalities Noted N/A N/A Periwound Skin Moisture: No Abnormalities Noted N/A N/A Periwound Skin Color: No Abnormality N/A N/A Temperature: Debridement N/A N/A Procedures Performed: Treatment Notes Electronic Signature(s) Signed: 02/11/2022 8:06:45 AM By: [name] MD FACS Entered By: [name] on 02/11/2022 08:06:45 -------------------------------------------------------------------------------- Multi-Disciplinary Care Plan Details Patient Name: Date of Service: [name], [name] 02/11/2022 7:30 [name] Medical Record Number: [id number] Patient Account Number: [id number] Date of Birth/Sex: Treating RN: 11/03/1970 (52 y.o. [name] Primary Care Joclynn Lumb: [name] Other Clinician: Referring Jorel Gravlin: Treating Maynor Mwangi/Extender: [name]: [place] reviewed with physician [name], Hue [name] ([id number]) [id number] Page 4 of 8 Active Inactive Nutrition Nursing Diagnoses: Impaired glucose control: actual or potential Potential for alteratiion [name] Nutrition/Potential for imbalanced nutrition Goals: Patient/caregiver will maintain therapeutic glucose control Date Initiated: 07/29/2021 Target Resolution Date: 02/21/2022 Goal Status: Active Interventions: Assess patient nutrition upon admission and as needed per policy Provide education on elevated blood sugars and impact on wound healing Treatment Activities: Patient referred to Primary Care Physician for further nutritional evaluation : 07/29/2021 Notes: Osteomyelitis Nursing Diagnoses: Infection: osteomyelitis Knowledge deficit related to disease process and management Goals: Patient/caregiver will verbalize understanding of disease process and disease management Date Initiated:  07/29/2021 Date Inactivated: 11/12/2021 Target Resolution Date: 11/19/2021 Goal Status: Met Patient's osteomyelitis will resolve Date Initiated: 07/29/2021 Target Resolution Date: 02/21/2022 Goal Status: Active Interventions: Assess for signs and symptoms of osteomyelitis resolution every visit Provide education on osteomyelitis Treatment Activities: Consult for HBO : 07/29/2021 Notes: Wound/Skin Impairment Nursing Diagnoses: Impaired tissue integrity Goals: Patient/caregiver will verbalize understanding of skin care regimen Date Initiated: 07/04/2021 Target Resolution Date: 03/11/2022 Goal Status: Active Interventions: Assess ulceration(s) every visit Treatment Activities: Skin care regimen initiated : 07/04/2021 Notes: Electronic Signature(s) Signed: 02/12/2022 5:16:51 PM By: [name] RN, BSN Entered By: [name] on 02/11/2022 07:54:42 [name], [name] ([id number]) [id number] Page 5 of 8 -------------------------------------------------------------------------------- Pain Assessment Details Patient Name: Date of Service: [name], [name] 02/11/2022 7:30 [name] Medical Record Number: [id number] Patient Account Number: [id number] Date of Birth/Sex: Treating RN: [birth year] (52 y.o. [name] Primary Care Temima Kutsch: [name] Other Clinician: Referring Bertine Schlottman: Treating Jaeden Messer/Extender: [name]: 31 Active Problems Location of Pain  Severity and Description of Pain Patient Has Paino No Site Locations Rate the pain. Current Pain Level: 0 Pain Management and Medication Current Pain Management: Electronic Signature(s) Signed: 02/12/2022 5:16:51 PM By: [name] RN, BSN Entered By: [name] on 02/11/2022 07:46:26 -------------------------------------------------------------------------------- Patient/Caregiver Education Details Patient Name: Date of Service: Horald Chestnut, [name]  [name] 1/23/2024andnbsp7:30 [name] Medical Record Number: [id number] Patient Account Number: [id number] Date of Birth/Gender: Treating RN: 29-May-1970 (52 y.o. [name] Primary Care Physician: [name] Other Clinician: Referring Physician: Treating Physician/Extender: [name]: 31 Education Assessment Education Provided To: Patient Education Topics Provided Offloading: Methods: Explain/Verbal Responses: Reinforcements needed, State content correctly Wound Debridement: Methods: Explain/Verbal Responses: Reinforcements needed, State content correctly [name], Darrill [name] ([id number]) [id number] Page 6 of 8 Electronic Signature(s) Signed: 02/12/2022 5:16:51 PM By: [name] RN, BSN Entered By: [name] on 02/11/2022 07:55:06 -------------------------------------------------------------------------------- Wound Assessment Details Patient Name: Date of Service: [name], [name] 02/11/2022 7:30 [name] Medical Record Number: [id number] Patient Account Number: [id number] Date of Birth/Sex: Treating RN: May 27, 1970 (52 y.o. [name] Primary Care Dakotah Orrego: [name] Other Clinician: Referring Anaeli Cornwall: Treating Ilyse Tremain/Extender: [name]: 31 Wound Status Wound Number: 2 Primary Etiology: Diabetic Wound/Ulcer of the Lower Extremity Wound Location: Right, Lateral, Plantar Foot Wound Status: Open Wounding Event: Gradually Appeared Comorbid History: Type II Diabetes Date Acquired: 12/19/2016 Weeks Of Treatment: 31 Clustered Wound: No Photos Wound Measurements Length: (cm) 0.2 Width: (cm) 1.2 Depth: (cm) 1.2 Area: (cm) 0.188 Volume: (cm) 0.226 % Reduction [name] Area: 94.8% % Reduction [name] Volume: 98.7% Epithelialization: Small (1-33%) Tunneling: No Undermining: Yes Starting Position (o'clock): 3 Ending Position (o'clock): 9 Maximum  Distance: (cm) 0.9 Wound Description Classification: Grade 3 Wound Margin: Thickened Exudate Amount: Medium Exudate Type: Serosanguineous Exudate Color: red, brown Foul Odor After Cleansing: No Slough/Fibrino No Wound Bed Granulation Amount: Large (67-100%) Exposed Structure Granulation Quality: Red Fascia Exposed: No Necrotic Amount: None Present (0%) Fat Layer (Subcutaneous Tissue) Exposed: Yes Tendon Exposed: No Muscle Exposed: No Joint Exposed: No Bone Exposed: No Periwound Skin Texture Texture Color No Abnormalities Noted: No No Abnormalities Noted: Yes Callus: Yes Temperature / Pain [name], [name] ([id number]) [id number] Page 7 of 8 Temperature: No Abnormality Moisture No Abnormalities Noted: Yes Treatment Notes Wound #2 (Foot) Wound Laterality: Plantar, Right, Lateral Cleanser Soap and Water Discharge Instruction: May shower and wash wound with dial antibacterial soap and water prior to dressing change. Wound Cleanser Discharge Instruction: Cleanse the wound with wound cleanser prior to applying a clean dressing using gauze sponges, not tissue or cotton balls. Peri-Wound Care cotton tipped applicators Topical Gentamicin Discharge Instruction: As directed by physician Primary Dressing Iodoform packing strip 1/4 ([name] Discharge Instruction: Lightly pack as instructed Promogran Prisma Matrix, 4.34 (sq [name] (silver collagen) Discharge Instruction: Moisten collagen with saline or hydrogel and place [name] base of wound Secondary Dressing Optifoam Non-Adhesive Dressing, 4x4 [name] Discharge Instruction: Apply over primary dressing as directed. Woven Gauze Sponge, Non-Sterile 4x4 [name] Discharge Instruction: Apply over primary dressing as directed. Secured With Paper Tape, 2x10 ([name]/yd) Discharge Instruction: Secure dressing with tape as directed. Compression Wrap Kerlix Roll 4.5x3.1 ([name]/yd) Discharge Instruction: Apply Kerlix and Coban compression  as directed. Compression Stockings Add-Ons Electronic Signature(s) Signed: 02/12/2022 5:16:51 PM By: [name] RN, BSN Entered By: [name] on 02/11/2022 07:53:58 -------------------------------------------------------------------------------- Vitals Details Patient Name: Date of Service: [name], [name] 02/11/2022 7:30 [name] Medical Record Number: [id number]  Patient Account Number: [id number] Date of Birth/Sex: Treating RN: [birth year] (52 y.o. [name] Primary Care Chloe Baig: [name] Other Clinician: Referring Toshiyuki Fredell: Treating Anahis Furgeson/Extender: [name]: 31 Vital Signs Time Taken: 07:45 Temperature (F): 98.1 Height ([name]: 72 Pulse (bpm): 69 Weight (lbs): 312 Respiratory Rate (breaths/min): 18 Body Mass Index (BMI): 42.3 Blood Pressure (mmHg): 169/93 Capillary Blood Glucose (mg/dl): 90 Reference Range: 80 - 120 mg / dl [name], Merrik [name] ([id number]) [id number] Page 8 of 8 Notes glucose per pt report this am Electronic Signature(s) Signed: 02/12/2022 5:16:51 PM By: [name] RN, BSN Entered By: [name] on 02/11/2022 07:46:17

## 2022-02-12 NOTE — Progress Notes (Signed)
[name], [name] ([id number]) [id number] Page 1 of 10 Visit Report for 02/11/2022 Chief Complaint Document Details Patient Name: Date of Service: [name], [name] 02/11/2022 7:30 [name] Medical Record Number: [id number] Patient Account Number: [id number] Date of Birth/Sex: Treating RN: October 28, 1970 (52 y.o. [name] Primary Care Provider: Garret [name] Other Clinician: Referring Provider: Treating Provider/Extender: [name] in Treatment: 31 Information Obtained from: Patient Chief Complaint Patients presents for treatment of an open diabetic ulcer Electronic Signature(s) Signed: 02/11/2022 8:06:52 AM By: [name] Maudlin MD FACS Entered By: [name] on 02/11/2022 08:06:51 -------------------------------------------------------------------------------- Debridement Details Patient Name: Date of Service: [name], [name] 02/11/2022 7:30 [name] Medical Record Number: [id number] Patient Account Number: [id number] Date of Birth/Sex: Treating RN: 04/26/1970 (52 y.o. [name] Mention Primary Care Provider: Garret [name] Other Clinician: Referring Provider: Treating Provider/Extender: [name] in Treatment: 31 Debridement Performed for Assessment: Wound #2 [place] Performed By: Physician [name] Maudlin, MD Debridement Type: Debridement Severity of Tissue Pre Debridement: Fat layer exposed Level of Consciousness (Pre-procedure): Awake and Alert Pre-procedure Verification/Time Out Yes - 08:00 Taken: Start Time: 08:01 Pain Control: Lidocaine 4% T opical Solution T Area Debrided (L x W): otal 0.5 (cm) x 1.2 (cm) = 0.6 (cm) Tissue and other material debrided: Non-Viable, Callus, Subcutaneous, Skin: Epidermis Level: Skin/Subcutaneous Tissue Debridement Description: Excisional Instrument: Curette Bleeding: Minimum Hemostasis Achieved: Pressure Procedural Pain: 0 Post Procedural  Pain: 0 Response to Treatment: Procedure was tolerated well Level of Consciousness (Post- Awake and Alert procedure): Post Debridement Measurements of Total Wound Length: (cm) 0.2 Width: (cm) 1.2 Depth: (cm) 1.2 Volume: (cm) 0.226 Character of Wound/Ulcer Post Debridement: Improved Severity of Tissue Post Debridement: Fat layer exposed [name], [name] ([id number]) [id number] Page 2 of 10 Post Procedure Diagnosis Same as Pre-procedure Notes scribed by [name] Gouty, RN for Dr. Celine [name] Electronic Signature(s) Signed: 02/11/2022 9:52:56 AM By: [name] Maudlin MD FACS Signed: 02/12/2022 5:16:51 PM By: [name] Gouty RN, BSN Entered By: [name] on 02/11/2022 08:04:18 -------------------------------------------------------------------------------- HPI Details Patient Name: Date of Service: [name], [name] 02/11/2022 7:30 [name] Medical Record Number: [id number] Patient Account Number: [id number] Date of Birth/Sex: Treating RN: [birth year] (52 y.o. 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 The most recent amputation was in August 2022. At that operation, antibiotic beads were placed in the wound. He has been managed by podiatry for his procedures and management of his wounds. He has been in a Water engineer. He is on oral doxycycline. They have been using Betadine wet to dry dressings along with Iodosorb. The patient states that when he thinks the wound is getting too dry, he applies topical Neosporin. At his last visit, on June 7 of this year, the podiatrist determined that he felt the  wound was stalled and referred him to wound care for additional evaluation and management. An MRI has been ordered, but not yet scheduled or performed. Pathology from his operation in August 2022 demonstrated findings consistent with chronic osteomyelitis. Today, there is a large irregular wound on the plantar surface of his right foot, at about the level of the fifth metatarsal head. This tracks through to a pinpoint  opening on the dorsal lateral portion of his foot. The intake nurse reported purulent drainage. There is some malodor from the wound. No frank necrosis identified. 07/11/2021: Today, the wounds do not connect. I attempted multiple times from various directions and the shared tunnel is no longer open. He has some slough accumulation on the dorsal part of his foot as well as slough and callus buildup on the plantar surface. His MRI is scheduled for June 29. No purulent drainage or malodor appreciated today. 07/19/2021: The lateral foot wound has closed and there is no tunnel connecting the plantar foot wound to that site. The plantar foot wound still probes quite deeply, however. There is some slough, eschar, and nonviable tissue accumulated in the wound bed. No malodorous drainage present. His MRI was performed yesterday and is consistent with fairly extensive osteomyelitis. 07/29/2021: The patient has an appointment with infectious disease on July 18 to treat his osteomyelitis. The plantar wound still probes quite deeply, approaching bone. The orifice has narrowed quite substantially, however, making it more difficult for him to pack. 08/05/2021: The tunnel connecting the lateral foot wound to the plantar foot wound has reopened. He sees infectious disease tomorrow to discuss long-term antibiotic treatment for osteomyelitis. There was a bit of murky drainage in the wound, but this was noted after he had had topical lidocaine applied so may have just been a blob of the anesthetic. No odor or  frank purulent drainage. The wound probes deeply at the midfoot approaching bone. He does have MRI results confirming his diagnosis of osteomyelitis. He has failed to progress with conventional treatment and I think his best chance for preservation of the foot is to initiate hyperbaric oxygen therapy. 08/13/2021: The tunnel connecting the 2 wounds has closed again. He has a PICC line and is getting IV daptomycin and cefepime. EKG and chest x-ray are within normal limits. He is scheduled to start hyperbaric oxygen therapy tomorrow. The wound in his midfoot probes deeply, approaching bone. The skin at the orifice continues to heaped up and threatens to close over despite the large cavity within. No erythema, induration, or purulent drainage. The wound on his lateral foot is fairly small and quite clean. 08/19/2021: The lateral foot wound has nearly closed. The wound in his midfoot does not probe quite as deeply today. The skin at the orifice continues to try to roll in and obscure the opening. No frankly necrotic tissue appreciated. He is tolerating hyperbaric oxygen therapy. 08/26/2021: The lateral foot wound has closed completely. The wound in his midfoot is shallower again today. The wound orifice is contracting. We are using gentamicin and silver alginate. He continues on IV daptomycin and cefepime and is tolerating hyperbaric oxygen therapy without difficulty. 09/02/2021: His foot is a little bit sore today but he was up walking on it all weekend doing back to school shopping with his daughter. The tunneling is down to about 3 cm and I do not appreciate bone at the tip of the probe. The wound is clean but has some callus creating an overhanging lip at the distal aspect. He continues to receive hyperbaric oxygen therapy as well as IV daptomycin and cefepime. 09/09/2021: His wound continues to improve. The tunnel has come in by 0.9 cm. He continues to form callus around the orifice of the wound. He is  tolerating hyperbaric oxygen therapy along with his IV antibiotics. 09/16/2021: The tunneling is down to just half a centimeter. He continues to build up callus around the orifice of  the wound. He completed his course of IV antibiotics and his PICC line is scheduled to be removed this Friday. He is tolerating hyperbaric oxygen without difficulty. We have been applying topical gentamicin and silver alginate to his wound. [name], [name] ([id number]) [id number] Page 3 of 10 09/24/2021: The wound depth has come in even further. There is a little bit of callus buildup around the orifice. The opening is quite narrow, at this point. 09/30/2021: The wound depth is about the same this week. The opening continues to contract with callus accumulation. There is some discoloration of the skin on the lateral part of his foot and he admitted to walking more than usual this weekend and also wearing a different pair shoes. He continues to tolerate hyperbaric oxygen therapy without difficulty. 10/07/2021: The wound depth has contracted a bit and is now approximately 1 to 1.5 cm. The orifice of the wound continues to try and close in over the space. There is just some callus and skin around the margins obscuring the orifice. 10/14/2021: The wound depth has come in a little bit more. The orifice of the wound is rolling inwards with epithelium and callus, making it difficult to pack the wound. 10/21/2021: There is still 1 portion of the wound, at the lateral aspect, that is still a couple of centimeters deep. The architecture of the wound makes this somewhat challenging to access and pack. Everything else looks clean. He has his usual accumulation of callus and skin, narrowing the orifice. 10/29/2021: No significant change to the depth of the wound. The orifice continues to contract with callus and skin narrowing the opening. His wife has been packing the wound and doing an excellent  job. 11/05/2021: The depth of the wound has come in by about a centimeter. There is less callus accumulation around the orificedespite this, the wound is narrower today. We are still awaiting insurance approval for Kerecis powder. 11/12/2021: The depth of the wound continues to contract. He has accumulated some callus around the orifice, as usual. We have received a verbal confirmation that he is approved for Kerecis, but no formal paperwork has yet been received. 11/19/2021: The depth continues to come in. There is callus accumulation around the orifice, as usual. He has been formally approved for Kerecis, but unfortunately we do not have a billing mechanism for the powder in [employer] yet. We are working to address this so we can begin treatment. 11/25/2021: Continued filling in of the depth of the wound. Continued buildup of callus around the orifice. No concern for infection. As we do not have a way to bill for Kerecis powder, but are capable of billing for the [facility] sheet, we are going to use that on him instead. 12/03/2021: The depth of the wound continues to fill in. As usual, he has accumulated callus and skin around the orifice of the wound. No concern for infection. He will complete his hyperbaric oxygen therapy this week. 12/17/2021: The wound depth continues to contract. There is callus and skin accumulation around the orifice of the wound, as per usual. There is more epithelium on the actual wound surface. Unfortunately, his Leroy Kennedy was not ordered. 12/24/2021: The wound depth has come in a little bit more this week. There is less skin accumulation around the orifice, but still some callus. On questioning, he admits to spending a fair amount of time on his feet. 12/12; this is a very difficult wound to assess size slitlike wound with some depth but minimal opening. I applied  Kerecis No. 4 12/18; Kerecis No. 5 01/15/2022: His wound measured deeper today. There is also some evidence of  pressure induced tissue injury around the lateral aspect of his foot. The patient reports that he was wearing different shoes than usual and felt like his foot was rolling a lot over the weekend. There is heavy callus around the orifice of the wound. No concern for infection. 01/21/2022: The depth decreased today from 1.7 to 1.0 cm. Significantly less tissue damage as compared to last week. Still with callus and skin accumulation around the orifice. 01/29/2022: The wound continues to contract and the depth is coming in further. We are still working on getting [employer] for him. 02/04/2022: No real change this week. Still working on the [employer]. 02/11/2022: No significant changes again. We are still working on the [employer]. Engineer, maintenance) Signed: 02/11/2022 8:07:15 AM By: [name] Maudlin MD FACS Entered By: [name] on 02/11/2022 08:07:15 -------------------------------------------------------------------------------- Physical Exam Details Patient Name: Date of Service: [name], [name] 02/11/2022 7:30 [name] Medical Record Number: [id number] Patient Account Number: [id number] Date of Birth/Sex: Treating RN: Jan 24, 1970 (52 y.o. [name] Primary Care Provider: Garret [name] Other Clinician: Referring Provider: Treating Provider/Extender: [name] in Treatment: 31 Constitutional Hypertensive, asymptomatic. . . . no acute distress. Respiratory Normal work of breathing on room air. [name], [name] ([id number]) [id number] Page 4 of 10 Notes 02/11/2022: No real changes again this week. Electronic Signature(s) Signed: 02/11/2022 8:07:51 AM By: [name] Maudlin MD FACS Entered By: [name] on 02/11/2022 08:07:51 -------------------------------------------------------------------------------- Physician Orders Details Patient Name: Date of Service: [name], [name] 02/11/2022 7:30 [name] Medical Record  Number: [id number] Patient Account Number: [id number] Date of Birth/Sex: Treating RN: 08-28-1970 (52 y.o. 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 - Dr [name] RM 1 Return A Tuesday 1/30 @ 0730 am Anesthetic Wound #2 Right,Lateral,Plantar Foot (In clinic) Topical Lidocaine 5% applied to wound bed (In clinic) Topical Lidocaine 4% applied to wound bed Cellular or Tissue Based Products Cellular or Tissue Based Product Type: - run IVR for epicord Bathing/ Shower/ Hygiene May shower and wash wound with soap and water. Off-Loading Other: - Try to not stand on your feet too much Wound Treatment Wound #2 - Foot Wound Laterality: Plantar, Right, Lateral Cleanser: Soap and Water 1 x Per Day/15 Days Discharge Instructions: May shower and wash wound with dial antibacterial soap and water prior to dressing change. Cleanser: Wound Cleanser (Generic) 1 x Per Day/15 Days Discharge Instructions: Cleanse the wound with wound cleanser prior to applying a clean dressing using gauze sponges, not tissue or cotton balls. Peri-Wound Care: cotton tipped applicators (Generic) 1 x Per Day/15 Days Topical: Gentamicin 1 x Per Day/15 Days Discharge Instructions: As directed by physician Prim Dressing: Iodoform packing strip 1/4 (in) 1 x Per Day/15 Days ary Discharge Instructions: Lightly pack as instructed Prim Dressing: Promogran Prisma Matrix, 4.34 (sq in) (silver collagen) 1 x Per Day/15 Days ary Discharge Instructions: Moisten  collagen with saline or hydrogel and place in base of wound Secondary Dressing: Optifoam Non-Adhesive Dressing, 4x4 in 1 x  Per Day/15 Days Discharge Instructions: Apply over primary dressing as directed. Secondary Dressing: Woven Gauze Sponge, Non-Sterile 4x4 in 1 x Per Day/15 Days Discharge Instructions: Apply over primary dressing as directed. [name], [name] ([id number]) [id number] Page 5 of 10 Secured With: Paper Tape, 2x10 (in/yd) (Generic) 1 x Per Day/15 Days Discharge Instructions: Secure dressing with tape as directed. Compression Wrap: Kerlix Roll 4.5x3.1 (in/yd) (Generic) 1 x Per Day/15 Days Discharge Instructions: Apply Kerlix and Coban compression as directed. Electronic Signature(s) Signed: 02/11/2022 9:52:56 AM By: [name] Maudlin MD FACS Entered By: [name] on 02/11/2022 08:08:03 -------------------------------------------------------------------------------- Problem List Details Patient Name: Date of Service: [name], [name] 02/11/2022 7:30 [name] Medical Record Number: [id number] Patient Account Number: [id number] Date of Birth/Sex: Treating RN: 1970/05/05 (52 y.o. 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 02/11/2022 7:30 [name] Medical Record Number: [id number] Patient Account Number: [id number] Date of Birth/Sex: Treating RN: May 09, 1970 (52 y.o. 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 The most recent amputation was in August 2022. At that operation, antibiotic beads were placed in the wound. He has been managed by podiatry for his procedures and management of his wounds. He has been in a Water engineer. He is on oral doxycycline. They have been using Betadine wet to dry dressings along with Iodosorb. The patient states that when he thinks the wound is getting too dry, he applies topical Neosporin. At his last visit, on June 7 of this year, the podiatrist determined that he felt the wound was stalled and referred him to wound care for additional evaluation and management. An MRI has been ordered, but not yet scheduled or performed. Pathology from his operation in August 2022 demonstrated findings consistent with chronic osteomyelitis. Today, there is a large irregular wound on the plantar surface of his right foot, at about the level of the fifth metatarsal head. This tracks through to a pinpoint opening on the dorsal lateral portion of his foot. The intake nurse reported purulent  drainage. There is some malodor from the wound. No frank necrosis identified. 07/11/2021: Today, the wounds do not connect. I attempted multiple times from various directions and the shared tunnel is no longer open. He has some slough accumulation on the dorsal part of his foot as well as slough and callus buildup on the plantar surface. His MRI is scheduled for June 29. No purulent drainage or malodor appreciated today. 07/19/2021: The lateral foot wound has closed and  there is no tunnel connecting the plantar foot wound to that site. The plantar foot wound still probes quite deeply, however. There is some slough, eschar, and nonviable tissue accumulated in the wound bed. No malodorous drainage present. His MRI was performed yesterday and is consistent with fairly extensive osteomyelitis. 07/29/2021: The patient has an appointment with infectious disease on July 18 to treat his osteomyelitis. The plantar wound still probes quite deeply, approaching bone. The orifice has narrowed quite substantially, however, making it more difficult for him to pack. 08/05/2021: The tunnel connecting the lateral foot wound to the plantar foot wound has reopened. He sees infectious disease tomorrow to discuss long-term antibiotic treatment for osteomyelitis. There was a bit of murky drainage in the wound, but this was noted after he had had topical lidocaine applied so may have just been a blob of the anesthetic. No odor or frank purulent drainage. The wound probes deeply at the midfoot approaching bone. He does have MRI results confirming his diagnosis of osteomyelitis. He has failed to progress with conventional treatment and I think his best chance for preservation of the foot is to initiate hyperbaric oxygen therapy. 08/13/2021: The tunnel connecting the 2 wounds has closed again. He has a PICC line and is getting IV daptomycin and cefepime. EKG and chest x-ray are within normal limits. He is scheduled to start  hyperbaric oxygen therapy tomorrow. The wound in his midfoot probes deeply, approaching bone. The skin at the orifice continues to heaped up and threatens to close over despite the large cavity within. No erythema, induration, or purulent drainage. The wound on his lateral foot is fairly small and quite clean. 08/19/2021: The lateral foot wound has nearly closed. The wound in his midfoot does not probe quite as deeply today. The skin at the orifice continues to try to roll in and obscure the opening. No frankly necrotic tissue appreciated. He is tolerating hyperbaric oxygen therapy. 08/26/2021: The lateral foot wound has closed completely. The wound in his midfoot is shallower again today. The wound orifice is contracting. We are using gentamicin and silver alginate. He continues on IV daptomycin and cefepime and is tolerating hyperbaric oxygen therapy without difficulty. 09/02/2021: His foot is a little bit sore today but he was up walking on it all weekend doing back to school shopping with his daughter. The tunneling is down to about 3 cm and I do not appreciate bone at the tip of the probe. The wound is clean but has some callus creating an overhanging lip at the distal aspect. He continues to receive hyperbaric oxygen therapy as well as IV daptomycin and cefepime. 09/09/2021: His wound continues to improve. The tunnel has come in by 0.9 cm. He continues to form callus around the orifice of the wound. He is tolerating hyperbaric oxygen therapy along with his IV antibiotics. 09/16/2021: The tunneling is down to just half a centimeter. He continues to build up callus around the orifice of the wound. He completed his course of IV antibiotics and his PICC line is scheduled to be removed this Friday. He is tolerating hyperbaric oxygen without difficulty. We have been applying topical gentamicin and silver alginate to his wound. 09/24/2021: The wound depth has come in even further. There is a little bit of  callus buildup around the orifice. The opening is quite narrow, at this point. 09/30/2021: The wound depth is about the same this week. The opening continues to contract with callus accumulation. There is some discoloration of the skin on  the lateral part of his foot and he admitted to walking more than usual this weekend and also wearing a different pair shoes. He continues to tolerate hyperbaric oxygen therapy without difficulty. 10/07/2021: The wound depth has contracted a bit and is now approximately 1 to 1.5 cm. The orifice of the wound continues to try and close in over the space. There is just some callus and skin around the margins obscuring the orifice. 10/14/2021: The wound depth has come in a little bit more. The orifice of the wound is rolling inwards with epithelium and callus, making it difficult to pack the wound. 10/21/2021: There is still 1 portion of the wound, at the lateral aspect, that is still a couple of centimeters deep. The architecture of the wound makes this somewhat challenging to access and pack. Everything else looks clean. He has his usual accumulation of callus and skin, narrowing the orifice. 10/29/2021: No significant change to the depth of the wound. The orifice continues to contract with callus and skin narrowing the opening. His wife has been packing the wound and doing an excellent job. 11/05/2021: The depth of the wound has come in by about a centimeter. There is less callus accumulation around the orificeoodespite this, the wound is narrower today. We are still awaiting insurance approval for Kerecis powder. 11/12/2021: The depth of the wound continues to contract. He has accumulated some callus around the orifice, as usual. We have received a verbal confirmation that he is approved for Kerecis, but no formal paperwork has yet been received. 11/19/2021: The depth continues to come in. There is callus accumulation around the orifice, as usual. He has been formally  approved for Kerecis, but unfortunately we do not have a billing mechanism for the powder in [employer] yet. We are working to address this so we can begin treatment. 11/25/2021: Continued filling in of the depth of the wound. Continued buildup of callus around the orifice. No concern for infection. As we do not have a way to bill for Kerecis powder, but are capable of billing for the [facility] sheet, we are going to use that on him instead. 12/03/2021: The depth of the wound continues to fill in. As usual, he has accumulated callus and skin around the orifice of the wound. No concern for infection. [name], [name] ([id number]) [id number] Page 7 of 10 He will complete his hyperbaric oxygen therapy this week. 12/17/2021: The wound depth continues to contract. There is callus and skin accumulation around the orifice of the wound, as per usual. There is more epithelium on the actual wound surface. Unfortunately, his Leroy Kennedy was not ordered. 12/24/2021: The wound depth has come in a little bit more this week. There is less skin accumulation around the orifice, but still some callus. On questioning, he admits to spending a fair amount of time on his feet. 12/12; this is a very difficult wound to assess size slitlike wound with some depth but minimal opening. I applied Kerecis No. 4 12/18; Kerecis No. 5 01/15/2022: His wound measured deeper today. There is also some evidence of pressure induced tissue injury around the lateral aspect of his foot. The patient reports that he was wearing different shoes than usual and felt like his foot was rolling a lot over the weekend. There is heavy callus around the orifice of the wound. No concern for infection. 01/21/2022: The depth decreased today from 1.7 to 1.0 cm. Significantly less tissue damage as compared to last week. Still with callus and skin accumulation around  the orifice. 01/29/2022: The wound continues to contract and the depth is  coming in further. We are still working on getting [employer] for him. 02/04/2022: No real change this week. Still working on the [employer]. 02/11/2022: No significant changes again. We are still working on the [employer]. Patient History Information obtained from Patient. Social History Never smoker, Marital Status - Married, Alcohol Use - Never, Drug Use - No History, Caffeine Use - Daily - T coffee. ea; Medical History Endocrine Patient has history of Type II Diabetes Hospitalization/Surgery History - Amuptation of 3rd,4th and 5th toes of Right foot;Oral Surgery;Anal Fissure surgery; Cholecystectomy. Objective Constitutional Hypertensive, asymptomatic. no acute distress. Vitals Time Taken: 7:45 AM, Height: 72 in, Weight: 312 lbs, BMI: 42.3, Temperature: 98.1 F, Pulse: 69 bpm, Respiratory Rate: 18 breaths/min, Blood Pressure: 169/93 mmHg, Capillary Blood Glucose: 90 mg/dl. General Notes: glucose per pt report this am Respiratory Normal work of breathing on room air. General Notes: 02/11/2022: No real changes again this week. Integumentary (Hair, Skin) Wound #2 status is Open. Original cause of wound was Gradually Appeared. The date acquired was: 12/19/2016. The wound has been in treatment 31 weeks. The wound is located on the [place]. The wound measures 0.2cm length x 1.2cm width x 1.2cm depth; 0.188cm^2 area and 0.226cm^3 volume. There is Fat Layer (Subcutaneous Tissue) exposed. There is no tunneling noted, however, there is undermining starting at 3:00 and ending at 9:00 with a maximum distance of 0.9cm. There is a medium amount of serosanguineous drainage noted. The wound margin is thickened. There is large (67-100%) red granulation within the wound bed. There is no necrotic tissue within the wound bed. The periwound skin appearance had no abnormalities noted for moisture. The periwound skin appearance had no abnormalities noted for color. The  periwound skin appearance exhibited: Callus. Periwound temperature was noted as No Abnormality. 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 There was a Excisional Skin/Subcutaneous Tissue Debridement with a total area of 0.6 sq cm performed by [name] Maudlin, MD. With the following instrument(s): Curette to remove Non-Viable tissue/material. Material removed includes Callus, Subcutaneous Tissue, and Skin: Epidermis after achieving pain control using Lidocaine 4% Topical Solution. No specimens were taken. A time out was conducted at 08:00, prior to the start of the procedure. A Minimum amount of bleeding was controlled with Pressure. The procedure was tolerated well with a pain level of 0 throughout and a pain level of 0 following the procedure. Post Debridement Measurements: 0.2cm length x 1.2cm width x 1.2cm depth; 0.226cm^3 volume. Character of Wound/Ulcer Post Debridement is improved. Severity of Tissue Post Debridement is: Fat layer exposed. Post procedure Diagnosis Wound #2: Same as Pre-Procedure General Notes: scribed by [name] Gouty, RN for Dr. Celine [name] Plan Follow-up Appointments: Return Appointment in 1 week. - Dr [name] RM 1 Tuesday 1/30 @ 0730 am Anesthetic: Wound #2 Right,Lateral,Plantar Foot: (In clinic) Topical Lidocaine 5% applied to wound bed (In clinic) Topical Lidocaine 4% applied to wound bed Cellular or Tissue Based Products: Cellular or Tissue Based Product Type: - run IVR for epicord Bathing/  Shower/ Hygiene: May shower and wash wound  with soap and water. Off-Loading: Other: - Try to not stand on your feet too much WOUND #2: - Foot Wound Laterality: Plantar, Right, Lateral Cleanser: Soap and Water 1 x Per Day/15 Days Discharge Instructions: May shower and wash wound with dial antibacterial soap and water prior to dressing change. Cleanser: Wound Cleanser (Generic) 1 x Per Day/15 Days Discharge Instructions: Cleanse the wound with wound cleanser prior to applying a clean dressing using gauze sponges, not tissue or cotton balls. Peri-Wound Care: cotton tipped applicators (Generic) 1 x Per Day/15 Days Topical: Gentamicin 1 x Per Day/15 Days Discharge Instructions: As directed by physician Prim Dressing: Iodoform packing strip 1/4 (in) 1 x Per Day/15 Days ary Discharge Instructions: Lightly pack as instructed Prim Dressing: Promogran Prisma Matrix, 4.34 (sq in) (silver collagen) 1 x Per Day/15 Days ary Discharge Instructions: Moisten collagen with saline or hydrogel and place in base of wound Secondary Dressing: Optifoam Non-Adhesive Dressing, 4x4 in 1 x Per Day/15 Days Discharge Instructions: Apply over primary dressing as directed. Secondary Dressing: Woven Gauze Sponge, Non-Sterile 4x4 in 1 x Per Day/15 Days Discharge Instructions: Apply over primary dressing as directed. Secured With: Paper T ape, 2x10 (in/yd) (Generic) 1 x Per Day/15 Days Discharge Instructions: Secure dressing with tape as directed. Com pression Wrap: Kerlix Roll 4.5x3.1 (in/yd) (Generic) 1 x Per Day/15 Days Discharge Instructions: Apply Kerlix and Coban compression as directed. 02/11/2022: There have been no significant changes in the wound again this week. I used a curette to debride callus, epidermis, and subcutaneous tissue from the wound. I am going to resume using topical gentamicin to impregnate the iodoform packing strips before they are packed into the wound. We are also going to add some Prisma  silver collagen to be placed into the wound bed before packing it. We are also going to run his insurance for [employer] while we are still working on the [employer]. He will follow-up in 1 week. Electronic Signature(s) Signed: 02/11/2022 8:09:10 AM By: [name] Maudlin MD FACS Entered By: [name] on 02/11/2022 08:09:10 -------------------------------------------------------------------------------- HxROS Details Patient Name: Date of Service: [name], [name] 02/11/2022 7:30 [name] Medical Record Number: [id number] Patient Account Number: [id number] Date of Birth/Sex: Treating RN: January 15, 1971 (52 y.o. [name] Primary Care Provider: Garret [name] Other Clinician: Referring Provider: Treating Provider/Extender: [name] in Treatment: [address] From [name], [name] ([id number]) [id number] Page 9 of 10 Patient Endocrine Medical History: Positive for: Type II Diabetes Immunizations Pneumococcal Vaccine: Received Pneumococcal Vaccination: Yes Received Pneumococcal Vaccination On or After 60th Birthday: No Implantable Devices None Hospitalization / Surgery History Type of Hospitalization/Surgery Amuptation of 3rd,4th and 5th toes of Right foot;Oral Surgery;Anal Fissure surgery; Cholecystectomy Family and Social History Never smoker; Marital Status - Married; Alcohol Use: Never; Drug Use: No History; Caffeine Use: Daily - T coffee; Financial Concerns: No; Food, ea; Clothing or Shelter Needs: No; Support System Lacking: No; Transportation Concerns: No Electronic Signature(s) Signed: 02/11/2022 9:52:56 AM By: [name] Maudlin MD FACS Entered By: [name] on 02/11/2022 08:07:21 -------------------------------------------------------------------------------- SuperBill Details Patient Name: Date of Service: [name], [name] 02/11/2022 Medical Record Number: [id number] Patient Account Number:  [id number] Date of Birth/Sex: Treating RN: 1970/02/17 (52 y.o. [name] Primary Care Provider: Garret [name] Other Clinician: Referring Provider: Treating Provider/Extender: [name] in Treatment: 31 Diagnosis Coding ICD-10 Codes Code Description [phone] Non-pressure chronic ulcer of other part of right foot with necrosis of bone E11.49 Type 2 diabetes mellitus with other  diabetic neurological complication J44.920 Other chronic osteomyelitis, right ankle and foot Facility Procedures : CPT4 Code: [id number] Description: 75883 - DEB SUBQ TISSUE 20 SQ CM/< ICD-10 Diagnosis Description L97.514 Non-pressure chronic ulcer of other part of right foot with necrosis of bone Modifier: Quantity: 1 Physician Procedures : CPT4 Code Description Modifier [id number] - WC PHYS LEVEL 4 - EST PT 25 ICD-10 Diagnosis Description L97.514 Non-pressure chronic ulcer of other part of right foot with necrosis of bone E11.49 Type 2 diabetes mellitus with other diabetic  neurological complication E15.830 Other chronic osteomyelitis, right ankle and foot Quantity: 1 : [id number] 88110 - WC PHYS SUBQ TISS 20 SQ CM ICD-10 Diagnosis Description L97.514 Non-pressure chronic ulcer of other part of right foot with necrosis of bone [name], [name] ([id number]) [id number] Quantity: 1 f Page 10 of 10 Electronic Signature(s) Signed: 02/11/2022 8:09:25 AM By: [name] Maudlin MD FACS Entered By: [name] on 02/11/2022 29:24:46

## 2022-02-18 ENCOUNTER — Encounter (HOSPITAL_BASED_OUTPATIENT_CLINIC_OR_DEPARTMENT_OTHER): Payer: BLUE CROSS/BLUE SHIELD | Admitting: General Surgery

## 2022-02-18 DIAGNOSIS — E11621 Type 2 diabetes mellitus with foot ulcer: Secondary | ICD-10-CM | POA: Diagnosis not present

## 2022-02-18 NOTE — Progress Notes (Signed)
[name], [name] ([id number]) [id number] Page 1 of 7 Visit Report for 02/18/2022 Arrival Information Details Patient Name: Date of Service: [place], [name] 02/18/2022 7:30 [name] Medical Record Number: [id number] Patient Account Number: [id number] Date of Birth/Sex: Treating RN: 1970-05-24 (52 y.o. [name] Primary Care Fayez Sturgell: [name] Other Clinician: Referring Lawonda Pretlow: Treating Kendrew Paci/Extender: [name] in Treatment: 94 Visit Information History Since Last Visit Added or deleted any medications: No Patient Arrived: Ambulatory Any new allergies or adverse reactions: No Arrival Time: 07:38 Had a fall or experienced change in No Accompanied By: self activities of daily living that may affect Transfer Assistance: None risk of falls: Patient Identification Verified: Yes Signs or symptoms of abuse/neglect since last visito No Secondary Verification Process Completed: Yes Hospitalized since last visit: No Patient Requires Transmission-Based Precautions: No Implantable device outside of the clinic excluding No Patient Has Alerts: Yes cellular tissue based products placed in the center since last visit: Has Dressing in Place as Prescribed: Yes Pain Present Now: No Electronic Signature(s) Signed: 02/18/2022 5:44:44 PM By: [name] RN, BSN Entered By: [name] on 02/18/2022 07:39:25 -------------------------------------------------------------------------------- Encounter Discharge Information Details Patient Name: Date of Service: [name], [name] 02/18/2022 7:30 [name] Medical Record Number: [id number] Patient Account Number: [id number] Date of Birth/Sex: Treating RN: 02-07-1970 (52 y.o. [name] Primary Care Cal Gindlesperger: [name] Other Clinician: Referring Alexzandrea Normington: Treating Kysa Calais/Extender: [name] in Treatment: 32 Encounter Discharge Information  Items Post Procedure Vitals Discharge Condition: Stable Temperature (F): 98.3 Ambulatory Status: Ambulatory Pulse (bpm): 71 Discharge Destination: Home Respiratory Rate (breaths/min): 18 Transportation: Private Auto Blood Pressure (mmHg): 168/93 Accompanied By: self Schedule Follow-up Appointment: Yes Clinical Summary of Care: Patient Declined Electronic Signature(s) Signed: 02/18/2022 5:44:44 PM By: [name] RN, BSN Entered By: [name] on 02/18/2022 08:20:00 [name], [name] ([id number]) [id number] Page 2 of 7 -------------------------------------------------------------------------------- Lower Extremity Assessment Details Patient Name: Date of Service: [name], [name] 02/18/2022 7:30 [name] Medical Record Number: [id number] Patient Account Number: [id number] Date of Birth/Sex: Treating RN: 1970/08/19 (52 y.o. [name] Primary Care Eudelia Hiltunen: [name] Other Clinician: Referring Holly Pring: Treating Jadi Deyarmin/Extender: [name] in Treatment: 32 Edema Assessment Assessed: [name]: No] [name]: No] Edema: [Left: N] [Right: o] Calf Left: Right: Point of Measurement: From Medial Instep 38 cm Ankle Left: Right: Point of Measurement: From Medial Instep 23 cm Vascular Assessment Pulses: Dorsalis Pedis Palpable: [Right:Yes] Electronic Signature(s) Signed: 02/18/2022 5:44:44 PM By: [name] RN, BSN Entered By: [name] on 02/18/2022 07:45:26 -------------------------------------------------------------------------------- Multi Wound Chart Details Patient Name: Date of Service: [name], [name] 02/18/2022 7:30 [name] Medical Record Number: [id number] Patient Account Number: [id number] Date of Birth/Sex: Treating RN: [birth year] (52 y.o. [name] Primary Care Hasani Diemer: [name] Other Clinician: Referring Aryaman Haliburton: Treating Elvan Ebron/Extender: [name] in  Treatment: 32 Vital Signs Height(in): 72 Pulse(bpm): 69 Weight(lbs): 312 Blood Pressure(mmHg): 168/93 Body Mass Index(BMI): 42.3 Temperature(F): 98.3 Respiratory Rate(breaths/min): 18 [2:Photos:] [N/A:N/A] Right, Lateral, Plantar Foot N/A N/A Wound Location: Gradually Appeared N/A N/A Wounding Event: Diabetic Wound/Ulcer of the Lower N/A N/A Primary Etiology: Extremity Type II Diabetes N/A N/A Comorbid History: 12/19/2016 N/A N/A Date Acquired: 65 N/A N/A Weeks of Treatment: Open N/A N/A Wound Status: No N/A N/A Wound Recurrence: 0.2x1.2x1 N/A N/A Measurements L x W x D (cm) 0.188 N/A N/A A (cm) : rea 0.188 N/A N/A Volume (cm) : 94.80% N/A N/A %  Reduction in A rea: 99.00% N/A N/A % Reduction in Volume: Grade 3 N/A N/A Classification: Medium N/A N/A Exudate A mount: Serosanguineous N/A N/A Exudate Type: red, brown N/A N/A Exudate Color: Thickened N/A N/A Wound Margin: Large (67-100%) N/A N/A Granulation A mount: Red N/A N/A Granulation Quality: Small (1-33%) N/A N/A Necrotic A mount: Fat Layer (Subcutaneous Tissue): Yes N/A N/A Exposed Structures: Fascia: No Tendon: No Muscle: No Joint: No Bone: No Small (1-33%) N/A N/A Epithelialization: Debridement - Excisional N/A N/A Debridement: Pre-procedure Verification/Time Out 07:50 N/A N/A Taken: Lidocaine 4% Topical Solution N/A N/A Pain Control: Callus, Subcutaneous N/A N/A Tissue Debrided: Skin/Subcutaneous Tissue N/A N/A Level: 0.24 N/A N/A Debridement A (sq cm): rea Curette N/A N/A Instrument: Minimum N/A N/A Bleeding: Pressure N/A N/A Hemostasis A chieved: 4 N/A N/A Procedural Pain: 2 N/A N/A Post Procedural Pain: Procedure was tolerated well N/A N/A Debridement Treatment Response: 0.2x1.2x1.1 N/A N/A Post Debridement Measurements L x W x D (cm) 0.207 N/A N/A Post Debridement Volume: (cm) Callus: Yes N/A N/A Periwound Skin Texture: No Abnormalities Noted N/A  N/A Periwound Skin Moisture: No Abnormalities Noted N/A N/A Periwound Skin Color: No Abnormality N/A N/A Temperature: Debridement N/A N/A Procedures Performed: Treatment Notes Electronic Signature(s) Signed: 02/18/2022 8:00:42 AM By: [name] MD FACS Entered By: [name] on 02/18/2022 08:00:42 -------------------------------------------------------------------------------- Multi-Disciplinary Care Plan Details Patient Name: Date of Service: [name], [name] 02/18/2022 7:30 [name] Medical Record Number: [id number] Patient Account Number: [id number] Date of Birth/Sex: Treating RN: [birth year] (52 y.o. [name] Primary Care Agapito Hanway: [name] Other Clinician: Referring Xavyer Steenson: Treating Jorrell Kuster/Extender: [name] in Treatment: [place] reviewed with physician [address] [name], [name] ([id number]) [id number] Page 4 of 7 Nutrition Nursing Diagnoses: Impaired glucose control: actual or potential Potential for alteratiion in Nutrition/Potential for imbalanced nutrition Goals: Patient/caregiver will maintain therapeutic glucose control Date Initiated: 07/29/2021 Target Resolution Date: 03/18/2022 Goal Status: Active Interventions: Assess patient nutrition upon admission and as needed per policy Provide education on elevated blood sugars and impact on wound healing Treatment Activities: Patient referred to Primary Care Physician for further nutritional evaluation : 07/29/2021 Notes: Wound/Skin Impairment Nursing Diagnoses: Impaired tissue integrity Goals: Patient/caregiver will verbalize understanding of skin care regimen Date Initiated: 07/04/2021 Target Resolution Date: 03/11/2022 Goal Status: Active Interventions: Assess ulceration(s) every visit Treatment Activities: Skin care regimen initiated : 07/04/2021 Notes: Electronic Signature(s) Signed: 02/18/2022  5:44:44 PM By: [name] RN, BSN Entered By: [name] on 02/18/2022 07:49:48 -------------------------------------------------------------------------------- Pain Assessment Details Patient Name: Date of Service: [name], [name] 02/18/2022 7:30 [name] Medical Record Number: [id number] Patient Account Number: [id number] Date of Birth/Sex: Treating RN: [birth year] (52 y.o. [name] Primary Care Yuuki Skeens: [name] Other Clinician: Referring Daven Pinckney: Treating Kyriakos Babler/Extender: [name] in Treatment: 32 Active Problems Location of Pain Severity and Description of Pain Patient Has Paino No Site Locations Rate the pain. [name], [name] ([id number]) [id number] Page 5 of 7 Rate the pain. Current Pain Level: 0 Pain Management and Medication Current Pain Management: Electronic Signature(s) Signed: 02/18/2022 5:44:44 PM By: [name] RN, BSN Entered By: [name] on 02/18/2022 07:40:05 -------------------------------------------------------------------------------- Patient/Caregiver Education Details Patient Name: Date of Service: [name], [name] 1/30/2024andnbsp7:30 [name] Medical Record Number: [id number] Patient Account Number: [id number] Date of Birth/Gender: Treating RN: Jun 12, 1970 (52 y.o. [name] Primary Care Physician: [name] Other Clinician: Referring Physician: Treating Physician/Extender: [name]  in Treatment: 32 Education Assessment Education Provided To: Patient Education Topics Provided Offloading: Methods: Explain/Verbal Responses: Reinforcements needed, State content correctly Wound/Skin Impairment: Methods: Explain/Verbal Responses: Reinforcements needed, State content correctly Electronic Signature(s) Signed: 02/18/2022 5:44:44 PM By: [name] RN, BSN Entered By: [name] on 02/18/2022  07:50:21 -------------------------------------------------------------------------------- Wound Assessment Details Patient Name: Date of Service: [name], [name] 02/18/2022 7:30 [name] [name], [name] ([id number]) [id number] Page 6 of 7 Medical Record Number: [id number] Patient Account Number: [id number] Date of Birth/Sex: Treating RN: 02-Jun-1970 (52 y.o. [name] Primary Care Myliah Medel: [name] Other Clinician: Referring Deanna Boehlke: Treating Brelan Hannen/Extender: [name] in Treatment: 32 Wound Status Wound Number: 2 Primary Etiology: Diabetic Wound/Ulcer of the Lower Extremity Wound Location: Right, Lateral, Plantar Foot Wound Status: Open Wounding Event: Gradually Appeared Comorbid History: Type II Diabetes Date Acquired: 12/19/2016 Weeks Of Treatment: 32 Clustered Wound: No Photos Wound Measurements Length: (cm) 0.2 Width: (cm) 1.2 Depth: (cm) 1 Area: (cm) 0.188 Volume: (cm) 0.188 % Reduction in Area: 94.8% % Reduction in Volume: 99% Epithelialization: Small (1-33%) Tunneling: No Undermining: No Wound Description Classification: Grade 3 Wound Margin: Thickened Exudate Amount: Medium Exudate Type: Serosanguineous Exudate Color: red, brown Foul Odor After Cleansing: No Slough/Fibrino No Wound Bed Granulation Amount: Large (67-100%) Exposed Structure Granulation Quality: Red Fascia Exposed: No Necrotic Amount: Small (1-33%) Fat Layer (Subcutaneous Tissue) Exposed: Yes Necrotic Quality: Adherent Slough Tendon Exposed: No Muscle Exposed: No Joint Exposed: No Bone Exposed: No Periwound Skin Texture Texture Color No Abnormalities Noted: No No Abnormalities Noted: Yes Callus: Yes Temperature / Pain Temperature: No Abnormality Moisture No Abnormalities Noted: Yes Treatment Notes Wound #2 (Foot) Wound Laterality: Plantar, Right, Lateral Cleanser Soap and Water Discharge Instruction: May  shower and wash wound with dial antibacterial soap and water prior to dressing change. Wound Cleanser Discharge Instruction: Cleanse the wound with wound cleanser prior to applying a clean dressing using gauze sponges, not tissue or cotton balls. Peri-Wound Care cotton tipped applicators Topical Gentamicin [name], [name] ([id number]) [id number] Page 7 of 7 Discharge Instruction: As directed by physician Primary Dressing Iodoform packing strip 1/4 (in) Discharge Instruction: Lightly pack as instructed Promogran Prisma Matrix, 4.34 (sq in) (silver collagen) Discharge Instruction: Moisten collagen with saline or hydrogel and place in base of wound Secondary Dressing Optifoam Non-Adhesive Dressing, 4x4 in Discharge Instruction: Apply over primary dressing as directed. Woven Gauze Sponge, Non-Sterile 4x4 in Discharge Instruction: Apply over primary dressing as directed. Secured With Paper Tape, 2x10 (in/yd) Discharge Instruction: Secure dressing with tape as directed. Compression Wrap Kerlix Roll 4.5x3.1 (in/yd) Discharge Instruction: Apply Kerlix and Coban compression as directed. Compression Stockings Add-Ons Electronic Signature(s) Signed: 02/18/2022 5:44:44 PM By: [name] RN, BSN Entered By: [name] on 02/18/2022 07:47:48 -------------------------------------------------------------------------------- Vitals Details Patient Name: Date of Service: [name], [name] 02/18/2022 7:30 [name] Medical Record Number: [id number] Patient Account Number: [id number] Date of Birth/Sex: Treating RN: [birth year] (52 y.o. [name] Primary Care Cece Milhouse: [name] Other Clinician: Referring Caryssa Elzey: Treating Aiya Keach/Extender: [name] in Treatment: 32 Vital Signs Time Taken: 07:39 Temperature (F): 98.3 Height (in): 72 Pulse (bpm): 71 Weight (lbs): 312 Respiratory Rate (breaths/min): 18 Body Mass Index  (BMI): 42.3 Blood Pressure (mmHg): 168/93 Reference Range: 80 - 120 mg / dl Electronic Signature(s) Signed: 02/18/2022 5:44:44 PM By: [name] RN, BSN Entered By: [name] on 02/18/2022 07:39:58

## 2022-02-18 NOTE — Progress Notes (Signed)
[name], [name] ([id number]) [id number] Page 1 of 10 Visit Report for 02/18/2022 Chief Complaint Document Details Patient Name: Date of Service: [name], [name] [name] 02/18/2022 7:30 [name] Patient Account Number: [id number] Date of Birth/Sex: Treating RN: 1970/04/09 (52 y.o. [name] Primary Care Provider: Garret [name] Other Clinician: Referring Provider: Treating Provider/Extender: [name] in Treatment: 32 Information Obtained from: Patient Chief Complaint Patients presents for treatment of an open diabetic ulcer Electronic Signature(s) Signed: 02/18/2022 8:00:51 AM By: [name] Maudlin MD FACS Entered By: [name] on 02/18/2022 08:00:51 -------------------------------------------------------------------------------- Debridement Details Patient Name: Date of Service: [name], [name] 02/18/2022 7:30 [name] Patient Account Number: [id number] Date of Birth/Sex: Treating RN: [birth year] (52 y.o. [name] Primary Care Provider: Garret [name] Other Clinician: Referring Provider: Treating Provider/Extender: [name] in Treatment: 32 Debridement Performed for Assessment: Wound #2 Right,Lateral,Plantar Foot Performed By: Physician [name] Maudlin, MD Debridement Type: Debridement Severity of Tissue Pre Debridement: Fat layer exposed Level of Consciousness (Pre-procedure): Awake and Alert Pre-procedure Verification/Time Out Yes - 07:50 Taken: Start Time: 07:53 Pain Control: Lidocaine 4% T opical Solution T Area Debrided (L x W): otal 0.2 (cm) x 1.2 (cm) = 0.24 (cm) Tissue and other material debrided: Viable, Non-Viable, Callus, Subcutaneous, Skin: Epidermis Level: Skin/Subcutaneous Tissue Debridement Description: Excisional Instrument: Curette Bleeding: Minimum Hemostasis Achieved: Pressure Procedural Pain: 4 Post  Procedural Pain: 2 Response to Treatment: Procedure was tolerated well Level of Consciousness (Post- Awake and Alert procedure): Post Debridement Measurements of Total Wound Length: (cm) 0.2 Width: (cm) 1.2 Depth: (cm) 1.1 Volume: (cm) 0.207 Character of Wound/Ulcer Post Debridement: Improved Severity of Tissue Post Debridement: Fat layer exposed [name], [name] ([id number]) [id number] Page 2 of 10 Post Procedure Diagnosis Same as Pre-procedure Notes scribed by [name] Gouty, RN for Dr. Celine [name] Electronic Signature(s) Signed: 02/18/2022 8:49:12 AM By: [name] Maudlin MD FACS Signed: 02/18/2022 5:44:44 PM By: [name] Gouty RN, BSN Entered By: [name] on 02/18/2022 07:56:51 -------------------------------------------------------------------------------- HPI Details Patient Name: Date of Service: [name], [name] 02/18/2022 7:30 [name] Patient Account Number: [id number] Date of Birth/Sex: Treating RN: 1970-07-23 (52 y.o. 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 The most recent amputation was in August 2022. At that operation, antibiotic beads were placed in the wound. He has been managed by podiatry for his procedures and management of his wounds. He has been in a Water engineer. He is on oral doxycycline. They have been using Betadine wet to dry dressings along with Iodosorb. The patient states that when he thinks the wound is getting too dry, he applies topical Neosporin. At his last visit, on June 7 of this year, the podiatrist determined that he  felt the wound was stalled and referred him to wound care for additional evaluation and management. An MRI has been ordered, but not yet scheduled or performed. Pathology from his operation in August 2022 demonstrated findings consistent with chronic osteomyelitis. Today, there is a large irregular wound on the plantar surface of his right foot, at about the level of the fifth metatarsal head. This tracks through to a  pinpoint opening on the dorsal lateral portion of his foot. The intake nurse reported purulent drainage. There is some malodor from the wound. No frank necrosis identified. 07/11/2021: Today, the wounds do not connect. I attempted multiple times from various directions and the shared tunnel is no longer open. He has some slough accumulation on the dorsal part of his foot as well as slough and callus buildup on the plantar surface. His MRI is scheduled for June 29. No purulent drainage or malodor appreciated today. 07/19/2021: The lateral foot wound has closed and there is no tunnel connecting the plantar foot wound to that site. The plantar foot wound still probes quite deeply, however. There is some slough, eschar, and nonviable tissue accumulated in the wound bed. No malodorous drainage present. His MRI was performed yesterday and is consistent with fairly extensive osteomyelitis. 07/29/2021: The patient has an appointment with infectious disease on July 18 to treat his osteomyelitis. The plantar wound still probes quite deeply, approaching bone. The orifice has narrowed quite substantially, however, making it more difficult for him to pack. 08/05/2021: The tunnel connecting the lateral foot wound to the plantar foot wound has reopened. He sees infectious disease tomorrow to discuss long-term antibiotic treatment for osteomyelitis. There was a bit of murky drainage in the wound, but this was noted after he had had topical lidocaine applied so may have just been a blob of the anesthetic.  No odor or frank purulent drainage. The wound probes deeply at the midfoot approaching bone. He does have MRI results confirming his diagnosis of osteomyelitis. He has failed to progress with conventional treatment and I think his best chance for preservation of the foot is to initiate hyperbaric oxygen therapy. 08/13/2021: The tunnel connecting the 2 wounds has closed again. He has a PICC line and is getting IV daptomycin and cefepime. EKG and chest x-ray are within normal limits. He is scheduled to start hyperbaric oxygen therapy tomorrow. The wound in his midfoot probes deeply, approaching bone. The skin at the orifice continues to heaped up and threatens to close over despite the large cavity within. No erythema, induration, or purulent drainage. The wound on his lateral foot is fairly small and quite clean. 08/19/2021: The lateral foot wound has nearly closed. The wound in his midfoot does not probe quite as deeply today. The skin at the orifice continues to try to roll in and obscure the opening. No frankly necrotic tissue appreciated. He is tolerating hyperbaric oxygen therapy. 08/26/2021: The lateral foot wound has closed completely. The wound in his midfoot is shallower again today. The wound orifice is contracting. We are using gentamicin and silver alginate. He continues on IV daptomycin and cefepime and is tolerating hyperbaric oxygen therapy without difficulty. 09/02/2021: His foot is a little bit sore today but he was up walking on it all weekend doing back to school shopping with his daughter. The tunneling is down to about 3 cm and I do not appreciate bone at the tip of the probe. The wound is clean but has some callus creating an overhanging lip at the distal aspect. He continues to receive hyperbaric oxygen therapy as well as IV daptomycin and cefepime. 09/09/2021: His wound continues to improve. The tunnel has come in by 0.9 cm. He continues to form callus around the orifice of the wound.  He is tolerating hyperbaric oxygen therapy along with his IV antibiotics. 09/16/2021: The tunneling is down to just half a centimeter. He continues to build up callus around the orifice  of the wound. He completed his course of IV antibiotics and his PICC line is scheduled to be removed this Friday. He is tolerating hyperbaric oxygen without difficulty. We have been applying topical gentamicin and silver alginate to his wound. [name], [name] ([id number]) [id number] Page 3 of 10 09/24/2021: The wound depth has come in even further. There is a little bit of callus buildup around the orifice. The opening is quite narrow, at this point. 09/30/2021: The wound depth is about the same this week. The opening continues to contract with callus accumulation. There is some discoloration of the skin on the lateral part of his foot and he admitted to walking more than usual this weekend and also wearing a different pair shoes. He continues to tolerate hyperbaric oxygen therapy without difficulty. 10/07/2021: The wound depth has contracted a bit and is now approximately 1 to 1.5 cm. The orifice of the wound continues to try and close in over the space. There is just some callus and skin around the margins obscuring the orifice. 10/14/2021: The wound depth has come in a little bit more. The orifice of the wound is rolling inwards with epithelium and callus, making it difficult to pack the wound. 10/21/2021: There is still 1 portion of the wound, at the lateral aspect, that is still a couple of centimeters deep. The architecture of the wound makes this somewhat challenging to access and pack. Everything else looks clean. He has his usual accumulation of callus and skin, narrowing the orifice. 10/29/2021: No significant change to the depth of the wound. The orifice continues to contract with callus and skin narrowing the opening. His wife has been packing the wound and doing an excellent  job. 11/05/2021: The depth of the wound has come in by about a centimeter. There is less callus accumulation around the orificedespite this, the wound is narrower today. We are still awaiting insurance approval for Kerecis powder. 11/12/2021: The depth of the wound continues to contract. He has accumulated some callus around the orifice, as usual. We have received a verbal confirmation that he is approved for Kerecis, but no formal paperwork has yet been received. 11/19/2021: The depth continues to come in. There is callus accumulation around the orifice, as usual. He has been formally approved for Kerecis, but unfortunately we do not have a billing mechanism for the powder in [employer] yet. We are working to address this so we can begin treatment. 11/25/2021: Continued filling in of the depth of the wound. Continued buildup of callus around the orifice. No concern for infection. As we do not have a way to bill for Kerecis powder, but are capable of billing for the [facility] sheet, we are going to use that on him instead. 12/03/2021: The depth of the wound continues to fill in. As usual, he has accumulated callus and skin around the orifice of the wound. No concern for infection. He will complete his hyperbaric oxygen therapy this week. 12/17/2021: The wound depth continues to contract. There is callus and skin accumulation around the orifice of the wound, as per usual. There is more epithelium on the actual wound surface. Unfortunately, his Leroy Kennedy was not ordered. 12/24/2021: The wound depth has come in a little bit more this week. There is less skin accumulation around the orifice, but still some callus. On questioning, he admits to spending a fair amount of time on his feet. 12/12; this is a very difficult wound to assess size slitlike wound with some depth but minimal opening. I  applied Kerecis No. 4 12/18; Kerecis No. 5 01/15/2022: His wound measured deeper today. There is also some evidence of  pressure induced tissue injury around the lateral aspect of his foot. The patient reports that he was wearing different shoes than usual and felt like his foot was rolling a lot over the weekend. There is heavy callus around the orifice of the wound. No concern for infection. 01/21/2022: The depth decreased today from 1.7 to 1.0 cm. Significantly less tissue damage as compared to last week. Still with callus and skin accumulation around the orifice. 01/29/2022: The wound continues to contract and the depth is coming in further. We are still working on getting [employer] for him. 02/04/2022: No real change this week. Still working on the [employer]. 02/11/2022: No significant changes again. We are still working on the [employer]. 02/18/2022: No change to the wound. We have submitted in IVR for [place]. Electronic Signature(s) Signed: 02/18/2022 8:01:11 AM By: [name] Maudlin MD FACS Entered By: [name] on 02/18/2022 08:01:11 -------------------------------------------------------------------------------- Physical Exam Details Patient Name: Date of Service: [name], [name] 02/18/2022 7:30 [name] Medical Record Number: [id number] Patient Account Number: [id number] Date of Birth/Sex: Treating RN: 10/16/1970 (52 y.o. [name] Primary Care Provider: Garret [name] Other Clinician: Referring Provider: Treating Provider/Extender: [name] in Treatment: 28 Constitutional Hypertensive, asymptomatic. . . . no acute distress. Respiratory Normal work of breathing on room air. [name], [name] ([id number]) [id number] Page 4 of 10 Notes 02/18/2022: There have really been no changes to this wound for several weeks. Electronic Signature(s) Signed: 02/18/2022 8:02:07 AM By: [name] Maudlin MD FACS Entered By: [name] on 02/18/2022  08:02:07 -------------------------------------------------------------------------------- Physician Orders Details Patient Name: Date of Service: [name], [name] 02/18/2022 7:30 [name] Medical Record Number: [id number] Patient Account Number: [id number] Date of Birth/Sex: Treating RN: 1970-09-05 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 - Dr [name] RM 1 Return A Tuesday 2/6 @ 0730 am Anesthetic Wound #2 Right,Lateral,Plantar Foot (In clinic) Topical Lidocaine 4% applied to wound bed Cellular or Tissue Based Products Cellular or Tissue Based Product Type: - run IVR for epicord Bathing/ Shower/ Hygiene May shower and wash wound with soap and water. Off-Loading Other: - Try to not stand on your feet too much Wound Treatment Wound #2 - Foot Wound Laterality: Plantar, Right, Lateral Cleanser: Soap and Water 1 x Per Day/15 Days Discharge Instructions: May shower and wash wound with dial antibacterial soap and water prior to dressing change. Cleanser: Wound Cleanser (Generic) 1 x Per Day/15 Days Discharge Instructions: Cleanse the wound with wound cleanser prior to applying a clean dressing using gauze sponges, not tissue or cotton balls. Peri-Wound Care: cotton tipped applicators (Generic) 1 x Per Day/15 Days Topical: Gentamicin 1 x Per Day/15 Days Discharge Instructions: As directed by physician Prim Dressing: Iodoform packing strip 1/4 (in) 1 x Per Day/15 Days ary Discharge Instructions: Lightly pack as  instructed Prim Dressing: Promogran Prisma Matrix, 4.34 (sq in) (silver collagen) 1 x Per Day/15 Days ary Discharge Instructions: Moisten collagen with saline or hydrogel and place in base  of wound Secondary Dressing: Optifoam Non-Adhesive Dressing, 4x4 in 1 x Per Day/15 Days Discharge Instructions: Apply over primary dressing as directed. Secondary Dressing: Woven Gauze Sponge, Non-Sterile 4x4 in 1 x Per Day/15 Days [name], [name] ([id number]) [id number] Page 5 of 10 Discharge Instructions: Apply over primary dressing as directed. Secured With: Paper Tape, 2x10 (in/yd) (Generic) 1 x Per Day/15 Days Discharge Instructions: Secure dressing with tape as directed. Compression Wrap: Kerlix Roll 4.5x3.1 (in/yd) (Generic) 1 x Per Day/15 Days Discharge Instructions: Apply Kerlix and Coban compression as directed. Electronic Signature(s) Signed: 02/18/2022 8:49:12 AM By: [name] Maudlin MD FACS Entered By: [name] on 02/18/2022 08:02:18 -------------------------------------------------------------------------------- Problem List Details Patient Name: Date of Service: [name], [name] 02/18/2022 7:30 [name] Medical Record Number: [id number] Patient Account Number: [id number] Date of Birth/Sex: Treating RN: 1971-01-05 (52 y.o. 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 02/18/2022 7:30 [name] Medical Record Number: [id number] Patient Account Number: [id number] Date of Birth/Sex: Treating RN: [birth year] (53 y.o. 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 The most recent amputation was in August 2022. At that operation, antibiotic beads were placed in the wound. He has been managed by podiatry for his procedures and management of his wounds. He has been in a Water engineer. He is on oral doxycycline. They have been using Betadine wet to dry dressings along with Iodosorb. The patient states that when he thinks the wound is getting too dry, he applies topical Neosporin. At his last visit, on June 7 of this year, the podiatrist determined that he felt the wound was stalled and referred him to wound care for additional evaluation and management. An MRI has been ordered, but not yet scheduled or performed. Pathology from his operation in August 2022 demonstrated findings consistent with chronic osteomyelitis. Today, there is a large irregular wound on the plantar surface of his right foot, at about the level of the fifth metatarsal head. This tracks through to a pinpoint opening on the dorsal lateral portion of  his foot. The intake nurse reported purulent drainage. There is some malodor from the wound. No frank necrosis identified. 07/11/2021: Today, the wounds do not connect. I attempted multiple times from various directions and the shared tunnel is no longer open. He has some slough accumulation on the dorsal part of his foot as well as slough and callus buildup on the plantar surface. His MRI is scheduled for June 29. No purulent drainage or  malodor appreciated today. 07/19/2021: The lateral foot wound has closed and there is no tunnel connecting the plantar foot wound to that site. The plantar foot wound still probes quite deeply, however. There is some slough, eschar, and nonviable tissue accumulated in the wound bed. No malodorous drainage present. His MRI was performed yesterday and is consistent with fairly extensive osteomyelitis. 07/29/2021: The patient has an appointment with infectious disease on July 18 to treat his osteomyelitis. The plantar wound still probes quite deeply, approaching bone. The orifice has narrowed quite substantially, however, making it more difficult for him to pack. 08/05/2021: The tunnel connecting the lateral foot wound to the plantar foot wound has reopened. He sees infectious disease tomorrow to discuss long-term antibiotic treatment for osteomyelitis. There was a bit of murky drainage in the wound, but this was noted after he had had topical lidocaine applied so may have just been a blob of the anesthetic. No odor or frank purulent drainage. The wound probes deeply at the midfoot approaching bone. He does have MRI results confirming his diagnosis of osteomyelitis. He has failed to progress with conventional treatment and I think his best chance for preservation of the foot is to initiate hyperbaric oxygen therapy. 08/13/2021: The tunnel connecting the 2 wounds has closed again. He has a PICC line and is getting IV daptomycin and cefepime. EKG and chest x-ray are within  normal limits. He is scheduled to start hyperbaric oxygen therapy tomorrow. The wound in his midfoot probes deeply, approaching bone. The skin at the orifice continues to heaped up and threatens to close over despite the large cavity within. No erythema, induration, or purulent drainage. The wound on his lateral foot is fairly small and quite clean. 08/19/2021: The lateral foot wound has nearly closed. The wound in his midfoot does not probe quite as deeply today. The skin at the orifice continues to try to roll in and obscure the opening. No frankly necrotic tissue appreciated. He is tolerating hyperbaric oxygen therapy. 08/26/2021: The lateral foot wound has closed completely. The wound in his midfoot is shallower again today. The wound orifice is contracting. We are using gentamicin and silver alginate. He continues on IV daptomycin and cefepime and is tolerating hyperbaric oxygen therapy without difficulty. 09/02/2021: His foot is a little bit sore today but he was up walking on it all weekend doing back to school shopping with his daughter. The tunneling is down to about 3 cm and I do not appreciate bone at the tip of the probe. The wound is clean but has some callus creating an overhanging lip at the distal aspect. He continues to receive hyperbaric oxygen therapy as well as IV daptomycin and cefepime. 09/09/2021: His wound continues to improve. The tunnel has come in by 0.9 cm. He continues to form callus around the orifice of the wound. He is tolerating hyperbaric oxygen therapy along with his IV antibiotics. 09/16/2021: The tunneling is down to just half a centimeter. He continues to build up callus around the orifice of the wound. He completed his course of IV antibiotics and his PICC line is scheduled to be removed this Friday. He is tolerating hyperbaric oxygen without difficulty. We have been applying topical gentamicin and silver alginate to his wound. 09/24/2021: The wound depth has come in  even further. There is a little bit of callus buildup around the orifice. The opening is quite narrow, at this point. 09/30/2021: The wound depth is about the same this week. The opening continues to contract  with callus accumulation. There is some discoloration of the skin on the lateral part of his foot and he admitted to walking more than usual this weekend and also wearing a different pair shoes. He continues to tolerate hyperbaric oxygen therapy without difficulty. 10/07/2021: The wound depth has contracted a bit and is now approximately 1 to 1.5 cm. The orifice of the wound continues to try and close in over the space. There is just some callus and skin around the margins obscuring the orifice. 10/14/2021: The wound depth has come in a little bit more. The orifice of the wound is rolling inwards with epithelium and callus, making it difficult to pack the wound. 10/21/2021: There is still 1 portion of the wound, at the lateral aspect, that is still a couple of centimeters deep. The architecture of the wound makes this somewhat challenging to access and pack. Everything else looks clean. He has his usual accumulation of callus and skin, narrowing the orifice. 10/29/2021: No significant change to the depth of the wound. The orifice continues to contract with callus and skin narrowing the opening. His wife has been packing the wound and doing an excellent job. 11/05/2021: The depth of the wound has come in by about a centimeter. There is less callus accumulation around the orificeoodespite this, the wound is narrower today. We are still awaiting insurance approval for Kerecis powder. 11/12/2021: The depth of the wound continues to contract. He has accumulated some callus around the orifice, as usual. We have received a verbal confirmation that he is approved for Kerecis, but no formal paperwork has yet been received. 11/19/2021: The depth continues to come in. There is callus accumulation around the  orifice, as usual. He has been formally approved for Kerecis, but unfortunately we do not have a billing mechanism for the powder in [facility] yet. We are working to address this so we can begin treatment. 11/25/2021: Continued filling in of the depth of the wound. Continued buildup of callus around the orifice. No concern for infection. As we do not have a way to bill for Kerecis powder, but are capable of billing for the [facility] sheet, we are going to use that on him instead. [name], [name] ([id number]) [id number] Page 7 of 10 12/03/2021: The depth of the wound continues to fill in. As usual, he has accumulated callus and skin around the orifice of the wound. No concern for infection. He will complete his hyperbaric oxygen therapy this week. 12/17/2021: The wound depth continues to contract. There is callus and skin accumulation around the orifice of the wound, as per usual. There is more epithelium on the actual wound surface. Unfortunately, his Leroy Kennedy was not ordered. 12/24/2021: The wound depth has come in a little bit more this week. There is less skin accumulation around the orifice, but still some callus. On questioning, he admits to spending a fair amount of time on his feet. 12/12; this is a very difficult wound to assess size slitlike wound with some depth but minimal opening. I applied Kerecis No. 4 12/18; Kerecis No. 5 01/15/2022: His wound measured deeper today. There is also some evidence of pressure induced tissue injury around the lateral aspect of his foot. The patient reports that he was wearing different shoes than usual and felt like his foot was rolling a lot over the weekend. There is heavy callus around the orifice of the wound. No concern for infection. 01/21/2022: The depth decreased today from 1.7 to 1.0 cm. Significantly less tissue damage as  compared to last week. Still with callus and skin accumulation around the orifice. 01/29/2022: The wound  continues to contract and the depth is coming in further. We are still working on getting [employer] for him. 02/04/2022: No real change this week. Still working on the [employer]. 02/11/2022: No significant changes again. We are still working on the [employer]. 02/18/2022: No change to the wound. We have submitted in IVR for [place]. Patient History Information obtained from Patient. Social History Never smoker, Marital Status - Married, Alcohol Use - Never, Drug Use - No History, Caffeine Use - Daily - T coffee. ea; Medical History Endocrine Patient has history of Type II Diabetes Hospitalization/Surgery History - Amuptation of 3rd,4th and 5th toes of Right foot;Oral Surgery;Anal Fissure surgery; Cholecystectomy. Objective Constitutional Hypertensive, asymptomatic. no acute distress. Vitals Time Taken: 7:39 AM, Height: 72 in, Weight: 312 lbs, BMI: 42.3, Temperature: 98.3 F, Pulse: 71 bpm, Respiratory Rate: 18 breaths/min, Blood Pressure: 168/93 mmHg. Respiratory Normal work of breathing on room air. General Notes: 02/18/2022: There have really been no changes to this wound for several weeks. Integumentary (Hair, Skin) Wound #2 status is Open. Original cause of wound was Gradually Appeared. The date acquired was: 12/19/2016. The wound has been in treatment 32 weeks. The wound is located on the [place]. The wound measures 0.2cm length x 1.2cm width x 1cm depth; 0.188cm^2 area and 0.188cm^3 volume. There is Fat Layer (Subcutaneous Tissue) exposed. There is no tunneling or undermining noted. There is a medium amount of serosanguineous drainage noted. The wound margin is thickened. There is large (67-100%) red granulation within the wound bed. There is a small (1-33%) amount of necrotic tissue within the wound bed including Adherent Slough. The periwound skin appearance had no abnormalities noted for moisture. The periwound skin appearance had no abnormalities  noted for color. The periwound skin appearance exhibited: Callus. Periwound temperature was noted as No Abnormality. 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 There was a Excisional Skin/Subcutaneous Tissue Debridement with a total area of 0.24 sq cm performed by [name] Maudlin, MD. With the following instrument(s): Curette to remove Viable and Non-Viable tissue/material. Material removed includes Callus, Subcutaneous Tissue, and Skin: Epidermis after achieving pain control using Lidocaine 4% Topical Solution. No specimens were taken. A time out was conducted at 07:50, prior to the start of the procedure. A Minimum amount of bleeding was controlled with Pressure. The procedure was tolerated well with a pain level of 4 throughout and a pain level of 2 following the procedure. Post Debridement Measurements: 0.2cm length x 1.2cm width x 1.1cm depth; 0.207cm^3 volume. Character of Wound/Ulcer Post Debridement is improved. Severity of Tissue Post Debridement is: Fat layer exposed. Post procedure Diagnosis Wound #2: Same as Pre-Procedure General Notes: scribed by [name] Gouty, RN for Dr. Celine [name] Plan Follow-up Appointments: Return Appointment in 1 week. - Dr [name] RM 1 Tuesday 2/6 @ 0730 am Anesthetic: Wound #2 Right,Lateral,Plantar Foot: (In clinic) Topical Lidocaine 4% applied to wound bed Cellular or Tissue Based Products: Cellular or Tissue Based Product Type: - run IVR for epicord Bathing/ Shower/ Hygiene: May shower  and  wash wound with soap and water. Off-Loading: Other: - Try to not stand on your feet too much WOUND #2: - Foot Wound Laterality: Plantar, Right, Lateral Cleanser: Soap and Water 1 x Per Day/15 Days Discharge Instructions: May shower and wash wound with dial antibacterial soap and water prior to dressing change. Cleanser: Wound Cleanser (Generic) 1 x Per Day/15 Days Discharge Instructions: Cleanse the wound with wound cleanser prior to applying a clean dressing using gauze sponges, not tissue or cotton balls. Peri-Wound Care: cotton tipped applicators (Generic) 1 x Per Day/15 Days Topical: Gentamicin 1 x Per Day/15 Days Discharge Instructions: As directed by physician Prim Dressing: Iodoform packing strip 1/4 (in) 1 x Per Day/15 Days ary Discharge Instructions: Lightly pack as instructed Prim Dressing: Promogran Prisma Matrix, 4.34 (sq in) (silver collagen) 1 x Per Day/15 Days ary Discharge Instructions: Moisten collagen with saline or hydrogel and place in base of wound Secondary Dressing: Optifoam Non-Adhesive Dressing, 4x4 in 1 x Per Day/15 Days Discharge Instructions: Apply over primary dressing as directed. Secondary Dressing: Woven Gauze Sponge, Non-Sterile 4x4 in 1 x Per Day/15 Days Discharge Instructions: Apply over primary dressing as directed. Secured With: Paper T ape, 2x10 (in/yd) (Generic) 1 x Per Day/15 Days Discharge Instructions: Secure dressing with tape as directed. Com pression Wrap: Kerlix Roll 4.5x3.1 (in/yd) (Generic) 1 x Per Day/15 Days Discharge Instructions: Apply Kerlix and Coban compression as directed. 02/18/2022: There have really been no changes to this wound for several weeks. He is not building up much callus and I do not think it is really an issue of offloading. The surface is fairly fibrotic so I aggressively debrided the subcutaneous tissue of his wound to try and stimulate the healing cascade again. I am hopeful that we will get approval for Epicord.  For now, we will continue topical gentamicin, Prisma silver collagen, and iodoform packing strips. Follow-up in 1 week. Electronic Signature(s) Signed: 02/18/2022 8:03:28 AM By: [name] Maudlin MD FACS Entered By: [name] on 02/18/2022 08:03:28 -------------------------------------------------------------------------------- HxROS Details Patient Name: Date of Service: [name], [name] 02/18/2022 7:30 [name] Medical Record Number: [id number] Patient Account Number: [id number] Date of Birth/Sex: Treating RN: [birth year] (52 y.o. [name] Primary Care Provider: Garret [name] Other Clinician: Referring Provider: Treating Provider/Extender: [name] in Treatment: [address] From [name], [place] ([id number]) [id number] Page 9 of 10 Patient Endocrine Medical History: Positive for: Type II Diabetes Immunizations Pneumococcal Vaccine: Received Pneumococcal Vaccination: Yes Received Pneumococcal Vaccination On or After 60th Birthday: No Implantable Devices None Hospitalization / Surgery History Type of Hospitalization/Surgery Amuptation of 3rd,4th and 5th toes of Right foot;Oral Surgery;Anal Fissure surgery; Cholecystectomy Family and Social History Never smoker; Marital Status - Married; Alcohol Use: Never; Drug Use: No History; Caffeine Use: Daily - T coffee; Financial Concerns: No; Food, ea; Clothing or Shelter Needs: No; Support System Lacking: No; Transportation Concerns: No Electronic Signature(s) Signed: 02/18/2022 8:49:12 AM By: [name] Maudlin MD FACS Entered By: [name] on 02/18/2022 08:01:39 -------------------------------------------------------------------------------- SuperBill Details Patient Name: Date of Service: [name], [name] 02/18/2022 Medical Record Number: [id number] Patient Account Number: [id number] Date of Birth/Sex: Treating RN: [birth year] (52 y.o. [name] Primary Care Provider:  Garret [name] Other Clinician: Referring Provider: Treating Provider/Extender: [name] in Treatment: 32 Diagnosis Coding ICD-10 Codes Code Description [phone] Non-pressure chronic ulcer of other part of right foot with necrosis of bone E11.49 Type 2 diabetes mellitus with other diabetic neurological complication E42.353 Other chronic osteomyelitis, right  ankle and foot Facility Procedures : CPT4 Code: [id number] Description: 81157 - DEB SUBQ TISSUE 20 SQ CM/< ICD-10 Diagnosis Description L97.514 Non-pressure chronic ulcer of other part of right foot with necrosis of bone Modifier: Quantity: 1 Physician Procedures : CPT4 Code Description Modifier [id number] - WC PHYS LEVEL 4 - EST PT 25 ICD-10 Diagnosis Description L97.514 Non-pressure chronic ulcer of other part of right foot with necrosis of bone E11.49 Type 2 diabetes mellitus with other diabetic  neurological complication H74.163 Other chronic osteomyelitis, right ankle and foot Quantity: 1 : [id number] 11042 - WC PHYS SUBQ TISS 20 SQ CM ICD-10 Diagnosis Description L97.514 Non-pressure chronic ulcer of other part of right foot with necrosis of bone [name], Jayse [name] ([id number]) [id number] Quantity: 1 f Page 10 of 10 Electronic Signature(s) Signed: 02/18/2022 8:03:43 AM By: [name] Maudlin MD FACS Entered By: [name] on 02/18/2022 08:03:43

## 2022-02-25 ENCOUNTER — Encounter (HOSPITAL_BASED_OUTPATIENT_CLINIC_OR_DEPARTMENT_OTHER): Payer: BLUE CROSS/BLUE SHIELD | Attending: General Surgery | Admitting: General Surgery

## 2022-02-25 DIAGNOSIS — L97514 Non-pressure chronic ulcer of other part of right foot with necrosis of bone: Secondary | ICD-10-CM | POA: Diagnosis not present

## 2022-02-25 DIAGNOSIS — I10 Essential (primary) hypertension: Secondary | ICD-10-CM | POA: Insufficient documentation

## 2022-02-25 DIAGNOSIS — E11621 Type 2 diabetes mellitus with foot ulcer: Secondary | ICD-10-CM | POA: Diagnosis present

## 2022-02-25 DIAGNOSIS — M86671 Other chronic osteomyelitis, right ankle and foot: Secondary | ICD-10-CM | POA: Insufficient documentation

## 2022-02-26 NOTE — Progress Notes (Signed)
[name], [name] ([id number]) [id number] Page 1 of 11 Visit Report for 02/25/2022 Chief Complaint Document Details Patient Name: Date of Service: [name], [address] [name] 02/25/2022 7:30 [name] Medical Record Number: [id number] Patient Account Number: [id number] Date of Birth/Sex: Treating RN: 1970-12-28 (52 [name].o. [name] Primary Care Provider: [name] Other Clinician: Referring Provider: Treating Provider/Extender: [name] in Treatment: 33 Information Obtained from: Patient Chief Complaint Patients presents for treatment of an open diabetic ulcer Electronic Signature(s) Signed: 02/25/2022 8:17:10 AM By: [name] MD FACS Entered By: [name] on 02/25/2022 08:17:10 -------------------------------------------------------------------------------- Cellular or Tissue Based Product Details Patient Name: Date of Service: [name], [name] 02/25/2022 7:30 [name] Medical Record Number: [id number] Patient Account Number: [id number] Date of Birth/Sex: Treating RN: May 18, 1970 (19 [name].o. [name] Primary Care Provider: [name] Other Clinician: Referring Provider: Treating Provider/Extender: [name] in Treatment: 33 Cellular or Tissue Based Product Type Wound #2 Right,Lateral,Plantar Foot Applied to: Performed By: Physician [name], MD Cellular or Tissue Based Product Type: Epicord Level of Consciousness (Pre-procedure): Awake and Alert Pre-procedure Verification/Time Out Yes - 08:05 Taken: Location: genitalia / hands / feet / multiple digits Wound Size (sq cm): 0.45 Product Size (sq cm): 6 Waste Size (sq cm): 0 Amount of Product Applied (sq cm): 6 Instrument Used: Curette Lot #: [id number] Order #: 1 Expiration Date: 09/21/2026 Fenestrated: No Reconstituted: Yes Solution Type: saline Solution Amount: 5 ml Lot #: [id number] Solution Expiration Date: 06/19/2024 Secured:  Yes Secured With: Steri-Strips Dressing Applied: Yes Primary Dressing: gauze, adaptic Procedural Pain: 0 Post Procedural Pain: 0 Response to Treatment: Procedure was tolerated well Level of Consciousness (Post- Awake and [address] [name], [name] ([id number]) [id number] Page 2 of 11 procedure): Post Procedure Diagnosis Same as Pre-procedure Notes scribed by [name], RN for [name] Electronic Signature(s) Signed: 02/25/2022 8:26:46 AM By: [name] MD FACS Signed: 02/25/2022 5:05:54 PM By: [name] RN, BSN Entered By: [name] on 02/25/2022 08:13:00 -------------------------------------------------------------------------------- Debridement Details Patient Name: Date of Service: [name], [name] 02/25/2022 7:30 [name] Medical Record Number: [id number] Patient Account Number: [id number] Date of Birth/Sex: Treating RN: November 11, 1970 (31 [name].o. [name] Primary Care Provider: [name] Other Clinician: Referring Provider: Treating Provider/Extender: [name] in Treatment: 33 Debridement Performed for Assessment: Wound #2 Right,Lateral,Plantar Foot Performed By: Physician [name], MD Debridement Type: Debridement Severity of Tissue Pre Debridement: Fat layer exposed Level of Consciousness (Pre-procedure): Awake and Alert Pre-procedure Verification/Time Out Yes - 08:00 Taken: Start Time: 08:04 Pain Control: Lidocaine 4% T opical Solution T Area Debrided (L x W): otal 0.7 (cm) x 1.5 (cm) = 1.05 (cm) Tissue and other material debrided: Non-Viable, Callus, Slough, Subcutaneous, Skin: Epidermis, Slough Level: Skin/Subcutaneous Tissue Debridement Description: Excisional Instrument: Curette Bleeding: Minimum Hemostasis Achieved: Pressure Procedural Pain: 2 Post Procedural Pain: 1 Response to Treatment: Procedure was tolerated well Level of Consciousness (Post- Awake and  Alert procedure): Post Debridement Measurements of Total Wound Length: (cm) 0.7 Width: (cm) 1.5 Depth: (cm) 1.7 Volume: (cm) 1.402 Character of Wound/Ulcer Post Debridement: Improved Severity of Tissue Post Debridement: Fat layer exposed Post Procedure Diagnosis Same as Pre-procedure Notes scribed by [name], RN for [name] Electronic Signature(s) Signed: 02/25/2022 8:26:46 AM By: [name] MD FACS Signed: 02/25/2022 5:05:54 PM By: [name] RN, BSN Entered By: [name] on 02/25/2022 08:07:58 [name], [name] ([id number]) [id number] Page 3 of 11 -------------------------------------------------------------------------------- HPI Details Patient Name:  Date of Service: [name], [name] 02/25/2022 7:30 [name] Medical Record Number: [id number] Patient Account Number: [id number] Date of Birth/Sex: Treating RN: 05-Jun-1970 (52 [name].o. 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 The most recent amputation was in August 2022. At that operation, antibiotic beads were placed in the wound. He has been managed by podiatry for his procedures and management of his wounds. He has been in a Water engineer. He is on oral doxycycline. They have been using Betadine wet to dry dressings along with Iodosorb. The patient states that when he thinks the wound is getting too dry, he applies topical Neosporin. At his last visit, on June 7 of this year, the podiatrist determined that he felt the wound was stalled and referred him to wound care for additional evaluation and management. An MRI has been  ordered, but not yet scheduled or performed. Pathology from his operation in August 2022 demonstrated findings consistent with chronic osteomyelitis. Today, there is a large irregular wound on the plantar surface of his right foot, at about the level of the fifth metatarsal head. This tracks through to a pinpoint opening on the dorsal lateral portion of his foot. The intake nurse reported purulent drainage. There is some malodor from the wound. No frank necrosis identified. 07/11/2021: Today, the wounds do not connect. I attempted multiple times from various directions and the shared tunnel is no longer open. He has some slough accumulation on the dorsal part of his foot as well as slough and callus buildup on the plantar surface. His MRI is scheduled for June 29. No purulent drainage or malodor appreciated today. 07/19/2021: The lateral foot wound has closed and there is no tunnel connecting the plantar foot wound to that site. The plantar foot wound still probes quite deeply, however. There is some slough, eschar, and nonviable tissue accumulated in the wound bed. No malodorous drainage present. His MRI was performed yesterday and is consistent with fairly extensive osteomyelitis. 07/29/2021: The patient has an appointment with infectious disease on July 18 to treat his osteomyelitis. The plantar wound still probes quite deeply, approaching bone. The orifice has narrowed quite substantially, however, making it more difficult for him to pack. 08/05/2021: The tunnel connecting the lateral foot wound to the plantar foot wound has reopened. He sees infectious disease tomorrow to discuss long-term antibiotic treatment for osteomyelitis. There was a bit of murky drainage in the wound, but this was noted after he had had topical lidocaine applied so may have just been a blob of the anesthetic. No odor or frank purulent drainage. The wound probes deeply at the midfoot approaching bone. He does have MRI results  confirming his diagnosis of osteomyelitis. He has failed to progress with conventional treatment and I think his best chance for preservation of the foot is to initiate hyperbaric oxygen therapy. 08/13/2021: The tunnel connecting the 2 wounds has closed again. He has a PICC line and is getting IV daptomycin and cefepime. EKG and chest x-ray are within normal limits. He is scheduled to start hyperbaric oxygen therapy tomorrow. The wound in his midfoot probes deeply, approaching bone. The skin at the orifice continues to heaped up and threatens to close  over despite the large cavity within. No erythema, induration, or purulent drainage. The wound on his lateral foot is fairly small and quite clean. 08/19/2021: The lateral foot wound has nearly closed. The wound in his midfoot does not probe quite as deeply today. The skin at the orifice continues to try to roll in and obscure the opening. No frankly necrotic tissue appreciated. He is tolerating hyperbaric oxygen therapy. 08/26/2021: The lateral foot wound has closed completely. The wound in his midfoot is shallower again today. The wound orifice is contracting. We are using gentamicin and silver alginate. He continues on IV daptomycin and cefepime and is tolerating hyperbaric oxygen therapy without difficulty. 09/02/2021: His foot is a little bit sore today but he was up walking on it all weekend doing back to school shopping with his daughter. The tunneling is down to about 3 cm and I do not appreciate bone at the tip of the probe. The wound is clean but has some callus creating an overhanging lip at the distal aspect. He continues to receive hyperbaric oxygen therapy as well as IV daptomycin and cefepime. 09/09/2021: His wound continues to improve. The tunnel has come in by 0.9 cm. He continues to form callus around the orifice of the wound. He is tolerating hyperbaric oxygen therapy along with his IV antibiotics. 09/16/2021: The tunneling is down to just  half a centimeter. He continues to build up callus around the orifice of the wound. He completed his course of IV antibiotics and his PICC line is scheduled to be removed this Friday. He is tolerating hyperbaric oxygen without difficulty. We have been applying topical gentamicin and silver alginate to his wound. 09/24/2021: The wound depth has come in even further. There is a little bit of callus buildup around the orifice. The opening is quite narrow, at this point. 09/30/2021: The wound depth is about the same this week. The opening continues to contract with callus accumulation. There is some discoloration of the skin on the lateral part of his foot and he admitted to walking more than usual this weekend and also wearing a different pair shoes. He continues to tolerate hyperbaric oxygen therapy without difficulty. 10/07/2021: The wound depth has contracted a bit and is now approximately 1 to 1.5 cm. The orifice of the wound continues to try and close in over the space. There is just some callus and skin around the margins obscuring the orifice. 10/14/2021: The wound depth has come in a little bit more. The orifice of the wound is rolling inwards with epithelium and callus, making it difficult to pack the wound. 10/21/2021: There is still 1 portion of the wound, at the lateral aspect, that is still a couple of centimeters deep. The architecture of the wound makes this somewhat challenging to access and pack. Everything else looks clean. He has his usual accumulation of callus and skin, narrowing the orifice. 10/29/2021: No significant change to the depth of the wound. The orifice continues to contract with callus and skin narrowing the opening. His wife has been packing the wound and doing an excellent job. [name], [name] ([id number]) [id number] Page 4 of 11 11/05/2021: The depth of the wound has come in by about a centimeter. There is less callus accumulation around the  orificedespite this, the wound is narrower today. We are still awaiting insurance approval for Kerecis powder. 11/12/2021: The depth of the wound continues to contract. He has accumulated some callus around the orifice, as usual. We have received a verbal confirmation  that he is approved for Kerecis, but no formal paperwork has yet been received. 11/19/2021: The depth continues to come in. There is callus accumulation around the orifice, as usual. He has been formally approved for Kerecis, but unfortunately we do not have a billing mechanism for the powder in [employer] yet. We are working to address this so we can begin treatment. 11/25/2021: Continued filling in of the depth of the wound. Continued buildup of callus around the orifice. No concern for infection. As we do not have a way to bill for Kerecis powder, but are capable of billing for the [facility] sheet, we are going to use that on him instead. 12/03/2021: The depth of the wound continues to fill in. As usual, he has accumulated callus and skin around the orifice of the wound. No concern for infection. He will complete his hyperbaric oxygen therapy this week. 12/17/2021: The wound depth continues to contract. There is callus and skin accumulation around the orifice of the wound, as per usual. There is more epithelium on the actual wound surface. Unfortunately, his Leroy Kennedy was not ordered. 12/24/2021: The wound depth has come in a little bit more this week. There is less skin accumulation around the orifice, but still some callus. On questioning, he admits to spending a fair amount of time on his feet. 12/12; this is a very difficult wound to assess size slitlike wound with some depth but minimal opening. I applied Kerecis No. 4 12/18; Kerecis No. 5 01/15/2022: His wound measured deeper today. There is also some evidence of pressure induced tissue injury around the lateral aspect of his foot. The patient reports that he was wearing different shoes  than usual and felt like his foot was rolling a lot over the weekend. There is heavy callus around the orifice of the wound. No concern for infection. 01/21/2022: The depth decreased today from 1.7 to 1.0 cm. Significantly less tissue damage as compared to last week. Still with callus and skin accumulation around the orifice. 01/29/2022: The wound continues to contract and the depth is coming in further. We are still working on getting [employer] for him. 02/04/2022: No real change this week. Still working on the [employer]. 02/11/2022: No significant changes again. We are still working on the [employer]. 02/18/2022: No change to the wound. We have submitted in IVR for [place]. 02/25/2022: Unfortunately, his wound has deteriorated over the past week. It is deeper and is approaching bone once again. There is more slough accumulation and the tissue shows signs of pressure injury. He says that he has been trying to wear his new work boots, which are waterproof. He says they make his feet sweat excessively. I think our main challenge with him is that we just are not able to adequately offload him while he is still working and on his feet through much of the day. He is willing to consider total contact casting, but will need to make some arrangements with work. On a more positive note, however, he has been approved for Epicord. Electronic Signature(s) Signed: 02/25/2022 8:19:01 AM By: [name] MD FACS Entered By: [name] on 02/25/2022 08:19:01 -------------------------------------------------------------------------------- Physical Exam Details Patient Name: Date of Service: [name], [name] 02/25/2022 7:30 [name] Medical Record Number: [id number] Patient Account Number: [id number] Date of Birth/Sex: Treating RN: 12/22/1970 (68 [name].o. [name] Primary Care Provider: [name] Other Clinician: Referring Provider: Treating Provider/Extender: [name]  in Treatment: 33 Constitutional Hypertensive, asymptomatic. . . .  no acute distress. Respiratory Normal work of breathing on room air. Notes 02/25/2022: Unfortunately, his wound has deteriorated over the past week. It is deeper and is approaching bone once again. There is more slough accumulation and the tissue shows signs of pressure injury. Electronic Signature(s) Signed: 02/25/2022 8:19:35 AM By: [name] MD FACS Entered By: [name] on 02/25/2022 08:19:34 [name], [name] ([id number]) [id number] Page 5 of 11 -------------------------------------------------------------------------------- Physician Orders Details Patient Name: Date of Service: [name], [name] 02/25/2022 7:30 [name] Medical Record Number: [id number] Patient Account Number: [id number] Date of Birth/Sex: Treating RN: 23-Dec-1970 (50 [name].o. 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 - Dr Celine Ahr RM 1 Return A Monday 2/12 @ 0730 am Anesthetic Wound #2 Right,Lateral,Plantar Foot (In clinic) Topical Lidocaine 4% applied to wound bed Cellular or Tissue Based Products Cellular or Tissue Based Product Type: - Epicord #1 daptic or Mepitel. (DO NOT REMOVE). - may Cellular or Tissue Based Product applied to wound bed, secured with steri-strips, cover with A change outer dressing only as needed Bathing/ Shower/ Hygiene May shower with protection but do not get wound dressing(s) wet. Protect dressing(s) with water  repellant cover (for example, large plastic bag) or a cast cover and may then take shower. Off-Loading Other: - Try to not stand on your feet too much, minimize walking Wound Treatment Wound #2 - Foot Wound Laterality: Plantar, Right, Lateral Cleanser: Soap and Water 1 x Per Week/30 Days Discharge Instructions: May shower and wash wound with dial antibacterial soap and water prior to dressing change. Cleanser: Wound Cleanser (Generic) 1 x Per Week/30 Days Discharge Instructions: Cleanse the wound with wound cleanser prior to applying a clean dressing using gauze sponges, not tissue or cotton balls. Peri-Wound Care: cotton tipped applicators (Generic) 1 x Per Week/30 Days Prim Dressing: Epicord ary 1 x Per Week/30 Days Secondary Dressing: ADAPTIC TOUCH 3x4.25 in 1 x Per Week/30 Days Discharge Instructions: Apply over primary dressing as directed. Secondary Dressing: Optifoam Non-Adhesive Dressing, 4x4 in 1 x Per Week/30 Days Discharge Instructions: Apply over primary dressing as directed. Secondary Dressing: Woven Gauze Sponges 2x2 in 1 x Per Week/30 Days Discharge Instructions: moistened with saline. Apply over primary dressing as directed. Secured With: Paper Tape, 2x10 (in/yd) (Generic) 1 x Per Week/30 Days Discharge Instructions: Secure dressing with tape as directed. Compression Wrap: Kerlix Roll 4.5x3.1 (in/yd) (Generic) 1 x Per Week/30 Days Discharge Instructions: Apply Kerlix and Coban compression as directed. [name], [name] ([id number]) [id number] Page 6 of 11 Electronic Signature(s) Signed: 02/25/2022 8:26:46 AM By: [name] MD FACS Signed: 02/25/2022 5:05:54 PM By: [name] RN, BSN Entered By: [name] on 02/25/2022 08:26:05 -------------------------------------------------------------------------------- Problem List Details Patient Name: Date of Service: [name], [name] 02/25/2022 7:30 [name] Medical Record Number:  [id number] Patient Account Number: [id number] Date of Birth/Sex: Treating RN: [birth year] (6 [name].o. 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 02/25/2022 7:30 [name] Medical Record Number: [id number] Patient Account Number: [id number] Date of Birth/Sex: Treating RN: [birth year] (64 [name].o. 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 The most recent amputation was in August 2022. At that operation, antibiotic beads were placed in the wound. He has been managed by podiatry for his procedures and management of his wounds. He has been in a Water engineer.  He is on oral doxycycline. They have been using Betadine wet to dry dressings along with Iodosorb. The patient states that when he thinks the wound is getting too dry, he applies topical Neosporin. At his last visit, on June 7 of this year, the podiatrist determined that he felt the wound was stalled and referred him to wound care for additional evaluation and management. An MRI has been ordered, but not yet scheduled or performed. Pathology from his operation in August 2022 demonstrated findings consistent with chronic osteomyelitis. Today, there is a large irregular wound on the plantar surface of his right foot, at about the level of the fifth metatarsal head. This tracks through to a pinpoint opening on the dorsal lateral portion of his foot. The intake nurse reported purulent drainage. There is some malodor from the wound. No frank necrosis identified. 07/11/2021: Today, the wounds do not connect. I attempted multiple times from various directions and the shared tunnel is no longer open. He has some slough accumulation on the dorsal part of his foot as well as slough and callus buildup on the plantar surface. His MRI is scheduled for June 29. No purulent drainage or malodor appreciated today. 07/19/2021: The lateral foot wound has closed and there is no tunnel connecting the plantar foot wound to that site. The plantar foot wound still probes quite deeply, however. There is some slough, eschar, and nonviable tissue accumulated in the wound bed. No malodorous drainage present. His MRI was performed yesterday and is consistent with fairly extensive osteomyelitis. 07/29/2021: The patient has an appointment with infectious disease on July 18 to treat his osteomyelitis. The plantar wound still probes quite deeply, approaching bone. The orifice has narrowed quite substantially, however, making it more difficult for him to pack. 08/05/2021: The tunnel connecting the lateral foot wound to the plantar foot  wound has reopened. He sees infectious disease tomorrow to discuss long-term antibiotic treatment for osteomyelitis. There was a bit of murky drainage in the wound, but this was noted after he had had topical lidocaine applied so may have just been a blob of the anesthetic. No odor or frank purulent drainage. The wound probes deeply at the midfoot approaching bone. He does have MRI results confirming his diagnosis of osteomyelitis. He has failed to progress  with conventional treatment and I think his best chance for preservation of the foot is to initiate hyperbaric oxygen therapy. 08/13/2021: The tunnel connecting the 2 wounds has closed again. He has a PICC line and is getting IV daptomycin and cefepime. EKG and chest x-ray are within normal limits. He is scheduled to start hyperbaric oxygen therapy tomorrow. The wound in his midfoot probes deeply, approaching bone. The skin at the orifice continues to heaped up and threatens to close over despite the large cavity within. No erythema, induration, or purulent drainage. The wound on his lateral foot is fairly small and quite clean. 08/19/2021: The lateral foot wound has nearly closed. The wound in his midfoot does not probe quite as deeply today. The skin at the orifice continues to try to roll in and obscure the opening. No frankly necrotic tissue appreciated. He is tolerating hyperbaric oxygen therapy. 08/26/2021: The lateral foot wound has closed completely. The wound in his midfoot is shallower again today. The wound orifice is contracting. We are using gentamicin and silver alginate. He continues on IV daptomycin and cefepime and is tolerating hyperbaric oxygen therapy without difficulty. 09/02/2021: His foot is a little bit sore today but he was up walking on it all weekend doing back to school shopping with his daughter. The tunneling is down to about 3 cm and I do not appreciate bone at the tip of the probe. The wound is clean but has some callus  creating an overhanging lip at the distal aspect. He continues to receive hyperbaric oxygen therapy as well as IV daptomycin and cefepime. 09/09/2021: His wound continues to improve. The tunnel has come in by 0.9 cm. He continues to form callus around the orifice of the wound. He is tolerating hyperbaric oxygen therapy along with his IV antibiotics. 09/16/2021: The tunneling is down to just half a centimeter. He continues to build up callus around the orifice of the wound. He completed his course of IV antibiotics and his PICC line is scheduled to be removed this Friday. He is tolerating hyperbaric oxygen without difficulty. We have been applying topical gentamicin and silver alginate to his wound. 09/24/2021: The wound depth has come in even further. There is a little bit of callus buildup around the orifice. The opening is quite narrow, at this point. 09/30/2021: The wound depth is about the same this week. The opening continues to contract with callus accumulation. There is some discoloration of the skin on the lateral part of his foot and he admitted to walking more than usual this weekend and also wearing a different pair shoes. He continues to tolerate hyperbaric oxygen therapy without difficulty. 10/07/2021: The wound depth has contracted a bit and is now approximately 1 to 1.5 cm. The orifice of the wound continues to try and close in over the space. There is just some callus and skin around the margins obscuring the orifice. 10/14/2021: The wound depth has come in a little bit more. The orifice of the wound is rolling inwards with epithelium and callus, making it difficult to pack the wound. 10/21/2021: There is still 1 portion of the wound, at the lateral aspect, that is still a couple of centimeters deep. The architecture of the wound makes this somewhat challenging to access and pack. Everything else looks clean. He has his usual accumulation of callus and skin, narrowing the  orifice. 10/29/2021: No significant change to the depth of the wound. The orifice continues to contract with callus and skin narrowing the opening. His  wife has been packing the wound and doing an excellent job. 11/05/2021: The depth of the wound has come in by about a centimeter. There is less callus accumulation around the orificeoodespite this, the wound is narrower today. We are still awaiting insurance approval for Kerecis powder. 11/12/2021: The depth of the wound continues to contract. He has accumulated some callus around the orifice, as usual. We have received a verbal confirmation that he is approved for Kerecis, but no formal paperwork has yet been received. 11/19/2021: The depth continues to come in. There is callus accumulation around the orifice, as usual. He has been formally approved for Kerecis, but unfortunately we do not have a billing mechanism for the powder in [facility] yet. We are working to address this so we can begin treatment. 11/25/2021: Continued filling in of the depth of the wound. Continued buildup of callus around the orifice. No concern for infection. As we do not have a way to bill for Kerecis powder, but are capable of billing for the [facility] sheet, we are going to use that on him instead. 12/03/2021: The depth of the wound continues to fill in. As usual, he has accumulated callus and skin around the orifice of the wound. No concern for infection. He will complete his hyperbaric oxygen therapy this week. 12/17/2021: The wound depth continues to contract. There is callus and skin accumulation around the orifice of the wound, as per usual. There is more epithelium on the actual wound surface. Unfortunately, his Leroy Kennedy was not ordered. 12/24/2021: The wound depth has come in a little bit more this week. There is less skin accumulation around the orifice, but still some callus. On questioning, he admits to spending a fair amount of time on his feet. 12/12; this is a very  difficult wound to assess size slitlike wound with some depth but minimal opening. I applied Kerecis No. 4 [name], [name] ([id number]) [id number] Page 8 of 11 12/18; Kerecis No. 5 01/15/2022: His wound measured deeper today. There is also some evidence of pressure induced tissue injury around the lateral aspect of his foot. The patient reports that he was wearing different shoes than usual and felt like his foot was rolling a lot over the weekend. There is heavy callus around the orifice of the wound. No concern for infection. 01/21/2022: The depth decreased today from 1.7 to 1.0 cm. Significantly less tissue damage as compared to last week. Still with callus and skin accumulation around the orifice. 01/29/2022: The wound continues to contract and the depth is coming in further. We are still working on getting [employer] for him. 02/04/2022: No real change this week. Still working on the [employer]. 02/11/2022: No significant changes again. We are still working on the [employer]. 02/18/2022: No change to the wound. We have submitted in IVR for [place]. 02/25/2022: Unfortunately, his wound has deteriorated over the past week. It is deeper and is approaching bone once again. There is more slough accumulation and the tissue shows signs of pressure injury. He says that he has been trying to wear his new work boots, which are waterproof. He says they make his feet sweat excessively. I think our main challenge with him is that we just are not able to adequately offload him while he is still working and on his feet through much of the day. He is willing to consider total contact casting, but will need to make some arrangements with work. On a more positive note, however, he has been approved  for Epicord. Patient History Information obtained from Patient. Social History Never smoker, Marital Status - Married, Alcohol Use - Never, Drug Use - No History, Caffeine Use -  Daily - T coffee. ea; Medical History Endocrine Patient has history of Type II Diabetes Hospitalization/Surgery History - Amuptation of 3rd,4th and 5th toes of Right foot;Oral Surgery;Anal Fissure surgery; Cholecystectomy. Objective Constitutional Hypertensive, asymptomatic. no acute distress. Vitals Time Taken: 7:44 AM, Height: 72 in, Weight: 312 lbs, BMI: 42.3, Temperature: 98.3 F, Pulse: 73 bpm, Respiratory Rate: 18 breaths/min, Blood Pressure: 166/97 mmHg, Capillary Blood Glucose: 101 mg/dl. General Notes: glucose per pt report this am Respiratory Normal work of breathing on room air. General Notes: 02/25/2022: Unfortunately, his wound has deteriorated over the past week. It is deeper and is approaching bone once again. There is more slough accumulation and the tissue shows signs of pressure injury. Integumentary (Hair, Skin) Wound #2 status is Open. Original cause of wound was Gradually Appeared. The date acquired was: 12/19/2016. The wound has been in treatment 33 weeks. The wound is located on the [place]. The wound measures 0.3cm length x 1.5cm width x 1.7cm depth; 0.353cm^2 area and 0.601cm^3 volume. There is Fat Layer (Subcutaneous Tissue) exposed. There is undermining starting at 12:00 and ending at 12:00 with a maximum distance of 1.1cm. There is a medium amount of serosanguineous drainage noted. The wound margin is thickened. There is small (1-33%) red granulation within the wound bed. There is a large (67-100%) amount of necrotic tissue within the wound bed including Adherent Slough. The periwound skin appearance had no abnormalities noted for moisture. The periwound skin appearance had no abnormalities noted for color. The periwound skin appearance exhibited: Callus. Periwound temperature was noted as No Abnormality. The periwound has tenderness on palpation. 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 There was a Excisional Skin/Subcutaneous Tissue Debridement with a total area of 1.05 sq cm performed by [name], MD. With the following instrument(s): Curette to remove Non-Viable tissue/material. Material removed includes Callus, Subcutaneous Tissue, Slough, and Skin: Epidermis after achieving pain control using Lidocaine 4% Topical Solution. No specimens were taken. A time out was conducted at 08:00, prior to the start of the procedure. A Minimum amount of bleeding was controlled with Pressure. The procedure was tolerated well with a pain level of 2 throughout and a pain level of 1 following the procedure. Post Debridement Measurements: 0.7cm length x 1.5cm width x 1.7cm depth; 1.402cm^3 volume. Character of Wound/Ulcer Post Debridement is improved. Severity of Tissue Post Debridement is: Fat layer exposed. Post procedure Diagnosis Wound #2: Same as Pre-Procedure General Notes: scribed by [name], RN for [name]. Pre-procedure diagnosis of Wound #2 is a Diabetic Wound/Ulcer of the Lower Extremity located on the Right,Lateral,Plantar Foot. A skin graft procedure using a bioengineered skin substitute/cellular or tissue based product was performed by [name], MD with the following instrument(s): Curette. Epicord was applied and secured with Steri-Strips. 6 sq cm of product was utilized and 0 sq cm was wasted. Post Application, gauze, adaptic was applied. A Time Out was conducted at 08:05, prior to the start of the  procedure. The procedure was tolerated  well with a pain level of 0 throughout and a pain level of 0 following the procedure. Post procedure Diagnosis Wound #2: Same as Pre-Procedure General Notes: scribed by [name], RN for [name]. Plan Follow-up Appointments: Return Appointment in 1 week. - Dr Celine Ahr RM 1 Monday 2/12 @ 0730 am Anesthetic: Wound #2 Right,Lateral,Plantar Foot: (In clinic) Topical Lidocaine 4% applied to wound bed Cellular or Tissue Based Products: Cellular or Tissue Based Product Type: - Epicord #1 Cellular or Tissue Based Product applied to wound bed, secured with steri-strips, cover with Adaptic or Mepitel. (DO NOT REMOVE). - may change outer dressing only as needed Bathing/ Shower/ Hygiene: May shower with protection but do not get wound dressing(s) wet. Protect dressing(s) with water repellant cover (for example, large plastic bag) or a cast cover and may then take shower. Off-Loading: Other: - Try to not stand on your feet too much, minimize walking WOUND #2: - Foot Wound Laterality: Plantar, Right, Lateral Cleanser: Soap and Water 1 x Per Week/30 Days Discharge Instructions: May shower and wash wound with dial antibacterial soap and water prior to dressing change. Cleanser: Wound Cleanser (Generic) 1 x Per Week/30 Days Discharge Instructions: Cleanse the wound with wound cleanser prior to applying a clean dressing using gauze sponges, not tissue or cotton balls. Peri-Wound Care: cotton tipped applicators (Generic) 1 x Per Week/30 Days Prim Dressing: Epicord 1 x Per Week/30 Days ary Secondary Dressing: ADAPTIC TOUCH 3x4.25 in 1 x Per Week/30 Days Discharge Instructions: Apply over primary dressing as directed. Secondary Dressing: Optifoam Non-Adhesive Dressing, 4x4 in 1 x Per Week/30 Days Discharge Instructions: Apply over primary dressing as directed. Secondary Dressing: Woven Gauze Sponges 2x2 in 1 x Per Week/30 Days Discharge Instructions: moistened with saline. Apply over primary  dressing as directed. Secured With: Paper T ape, 2x10 (in/yd) (Generic) 1 x Per Week/30 Days Discharge Instructions: Secure dressing with tape as directed. Com pression Wrap: Kerlix Roll 4.5x3.1 (in/yd) (Generic) 1 x Per Week/30 Days Discharge Instructions: Apply Kerlix and Coban compression as directed. 02/25/2022: Unfortunately, his wound has deteriorated over the past week. It is deeper and is approaching bone once again. There is more slough accumulation and the tissue shows signs of pressure injury. I used a curette to debride slough, callus, nonviable subcutaneous tissue, and epidermal skin from his wound. The Epicord was then rehydrated and packed into his wound. It did not completely fill the space and so I backed it with saline moistened gauze. Everything was secured in place with Adaptic and Steri- Strips. He is willing to consider total contact casting but will need to make some arrangements with work and FMLA/short-term disability. He anticipates that he will be able to have these issues addressed and in place in 2 weeks. He will follow-up in 1 week for Epicord #2. Electronic Signature(s) Signed: 02/25/2022 11:19:40 AM By: [name] MD FACS Signed: 02/25/2022 5:05:54 PM By: [name] RN, BSN Previous Signature: 02/25/2022 8:21:48 AM Version By: [name] MD FACS Entered By: [name] on 02/25/2022 08:28:52 HxROS Details -------------------------------------------------------------------------------- [name], [name] ([id number]) [id number] Page 10 of 11 Patient Name: Date of Service: [name], [name] 02/25/2022 7:30 [name] Medical Record Number: [id number] Patient Account Number: [id number] Date of Birth/Sex: Treating RN: 1971-01-10 (32 [name].o. [name] Primary Care Provider: [name] Other Clinician: Referring Provider: Treating Provider/Extender: [name] in Treatment: 33 Information Obtained  From Patient Endocrine Medical History: Positive for: Type II Diabetes  Immunizations Pneumococcal Vaccine: Received Pneumococcal Vaccination: Yes Received Pneumococcal Vaccination On or After 60th Birthday: No Implantable Devices None Hospitalization / Surgery History Type of Hospitalization/Surgery Amuptation of 3rd,4th and 5th toes of Right foot;Oral Surgery;Anal Fissure surgery; Cholecystectomy Family and Social History Never smoker; Marital Status - Married; Alcohol Use: Never; Drug Use: No History; Caffeine Use: Daily - T coffee; Financial Concerns: No; Food, ea; Clothing or Shelter Needs: No; Support System Lacking: No; Transportation Concerns: No Electronic Signature(s) Signed: 02/25/2022 8:26:46 AM By: [name] MD FACS Entered By: [name] on 02/25/2022 08:19:07 -------------------------------------------------------------------------------- SuperBill Details Patient Name: Date of Service: [name], [name] 02/25/2022 Medical Record Number: [id number] Patient Account Number: [id number] Date of Birth/Sex: Treating RN: December 12, 1970 (34 [name].o. [name] Primary Care Provider: [name] Other Clinician: Referring Provider: Treating Provider/Extender: [name] in Treatment: 33 Diagnosis Coding ICD-10 Codes Code Description [phone] Non-pressure chronic ulcer of other part of right foot with necrosis of bone E11.49 Type 2 diabetes mellitus with other diabetic neurological complication U31.497 Other chronic osteomyelitis, right ankle and foot Facility Procedures : CPT4 Code: [id number] Description: I5027 Epicord 2cm x 3cm - per sqcm Modifier: Quantity: 6 : CPT4 Code: [id number] Description: 76720 - SKIN SUB GRAFT FACE/NK/HF/G ICD-10 Diagnosis Description L97.514 Non-pressure chronic ulcer of other part of right foot with necrosis of bone Modifier: Quantity: 1 Physician Procedures [name], [name] ([id number]): CPT4 Code Description  [id number] - WC PHYS LEVEL 4 - EST PT ICD-10 Diagnosis Description L97.514 Non-pressure chronic ulcer of other part of right foot with necr M86.671 Other chronic osteomyelitis, right ankle and foot  E11.49 Type 2 diabetes mellitus with other diabetic neurological compli [id number] Page 11 of 11: Quantity Modifier 25 1 osis of bone cation [name], [name] ([id number]): [id number] - WC PHYS SKIN SUB GRAFT FACE/NK/HF/G ICD-10 Diagnosis Description L97.514 Non-pressure chronic ulcer of other part of right foot with necr [id number] Page 11 of 11: 1 osis of bone Electronic Signature(s) Signed: 02/25/2022 8:22:05 AM By: [name] MD FACS Entered By: [name] on 02/25/2022 08:22:05

## 2022-02-26 NOTE — Progress Notes (Signed)
[name], Ricardo M ([id number]) [id number] Page 1 of 7 Visit Report for 02/25/2022 Arrival Information Details Patient Name: Date of Service: [place], Smoot [name] 02/25/2022 7:30 A M Medical Record Number: [id number] Patient Account Number: [id number] Date of Birth/Sex: Treating RN: [birth year] (52 y.o. [name] Primary Care Undra Harriman: [name] Other Clinician: Referring Barnett Elzey: Treating Madex Seals/Extender: [name] in Treatment: 33 Visit Information History Since Last Visit Added or deleted any medications: No Patient Arrived: Ambulatory Any new allergies or adverse reactions: No Arrival Time: 07:43 Had a fall or experienced change in No Accompanied By: self activities of daily living that may affect Transfer Assistance: None risk of falls: Patient Identification Verified: Yes Signs or symptoms of abuse/neglect since last visito No Secondary Verification Process Completed: Yes Hospitalized since last visit: No Patient Requires Transmission-Based Precautions: No Implantable device outside of the clinic excluding No Patient Has Alerts: Yes cellular tissue based products placed in the center since last visit: Has Dressing in Place as Prescribed: Yes Has Footwear/Offloading in Place as Prescribed: Yes Right: Other:custom insert Pain Present Now: No Electronic Signature(s) Signed: 02/25/2022 5:05:54 PM By: [name] RN, BSN Entered By: [name] on 02/25/2022 07:44:12 -------------------------------------------------------------------------------- Encounter Discharge Information Details Patient Name: Date of Service: [place], [name] 02/25/2022 7:30 A M Medical Record Number: [id number] Patient Account Number: [id number] Date of Birth/Sex: Treating RN: 1970/05/30 (52 y.o. [name] Primary Care Tarina Volk: [name] Other Clinician: Referring Jamesrobert Ohanesian: Treating Conleigh Heinlein/Extender: [name] in Treatment: 33 Encounter Discharge Information Items Post Procedure Vitals Discharge Condition: Stable Temperature (F): 98.3 Ambulatory Status: Ambulatory Pulse (bpm): 73 Discharge Destination: Home Respiratory Rate (breaths/min): 18 Transportation: Private Auto Blood Pressure (mmHg): 166/97 Accompanied By: self Schedule Follow-up Appointment: Yes Clinical Summary of Care: Patient Declined Electronic Signature(s) Signed: 02/25/2022 5:05:54 PM By: [name] RN, BSN Entered By: [name] on 02/25/2022 08:28:26 [name], Audrey M ([id number]) [id number] Page 2 of 7 -------------------------------------------------------------------------------- Lower Extremity Assessment Details Patient Name: Date of Service: [name], [name] 02/25/2022 7:30 A M Medical Record Number: [id number] Patient Account Number: [id number] Date of Birth/Sex: Treating RN: 02/04/1970 (52 y.o. [name] Primary Care Teon Hudnall: [name] Other Clinician: Referring Manali Mcelmurry: Treating Wasif Simonich/Extender: [name] in Treatment: 33 Edema Assessment Assessed: [name]: No] [Right: No] Edema: [Left: N] [Right: o] Calf Left: Right: Point of Measurement: From Medial Instep 38 cm Ankle Left: Right: Point of Measurement: From Medial Instep 23 cm Vascular Assessment Pulses: Dorsalis Pedis Palpable: [Right:No] Electronic Signature(s) Signed: 02/25/2022 5:05:54 PM By: [name] RN, BSN Entered By: [name] on 02/25/2022 07:48:15 -------------------------------------------------------------------------------- Multi Wound Chart Details Patient Name: Date of Service: [name], [name] 02/25/2022 7:30 A M Medical Record Number: [id number] Patient Account Number: [id number] Date of Birth/Sex: Treating RN: Aug 16, 1970 (52 y.o. 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 Type II Diabetes N/A N/A Comorbid History: 12/19/2016 N/A N/A Date Acquired: 89 N/A N/A Weeks of Treatment: Open N/A N/A Wound Status: No N/A N/A Wound Recurrence: 0.3x1.5x1.7 N/A N/A  Measurements L x W x D (cm) 0.353 N/A N/A A (cm) : rea 0.601 N/A N/A Volume (cm) : 90.20% N/A N/A % Reduction in A rea: 96.70% N/A N/A % Reduction in Volume: 12 Starting Position 1 (o'clock): 12 Ending Position 1 (o'clock): 1.1 Maximum Distance 1 (cm): Yes N/A N/A Undermining: Grade 3 N/A N/A Classification: Medium N/A N/A Exudate A mount: Serosanguineous N/A N/A Exudate Type: red, brown N/A N/A Exudate Color: Thickened N/A N/A Wound Margin: Small (1-33%) N/A N/A Granulation A mount: Red N/A N/A Granulation Quality: Large (67-100%) N/A N/A Necrotic A mount: Fat Layer (Subcutaneous Tissue): Yes N/A N/A Exposed Structures: Fascia: No Tendon: No Muscle: No Joint: No Bone: No Small (1-33%) N/A N/A Epithelialization: Debridement - Excisional N/A N/A Debridement: Pre-procedure Verification/Time Out 08:00 N/A N/A Taken: Lidocaine 4% Topical Solution N/A N/A Pain Control: Callus, Subcutaneous, Slough N/A N/A Tissue Debrided: Skin/Subcutaneous Tissue N/A N/A Level: 1.05 N/A N/A Debridement A (sq cm): rea Curette N/A N/A Instrument: Minimum N/A N/A Bleeding: Pressure N/A N/A Hemostasis A  chieved: 2 N/A N/A Procedural Pain: 1 N/A N/A Post Procedural Pain: Procedure was tolerated well N/A N/A Debridement Treatment Response: 0.7x1.5x1.7 N/A N/A Post Debridement Measurements L x W x D (cm) 1.402 N/A N/A Post Debridement Volume: (cm) Callus: Yes N/A N/A Periwound Skin Texture: No Abnormalities Noted N/A N/A Periwound Skin Moisture: No Abnormalities Noted N/A N/A Periwound Skin Color: No Abnormality N/A N/A Temperature: Yes N/A N/A Tenderness on Palpation: Cellular or Tissue Based Product N/A N/A Procedures Performed: Debridement Treatment Notes Electronic Signature(s) Signed: 02/25/2022 8:17:03 AM By: [name] MD FACS Entered By: [name] on 02/25/2022 08:17:03 -------------------------------------------------------------------------------- Multi-Disciplinary Care Plan Details Patient Name: Date of Service: [name], [name] 02/25/2022 7:30 A M Medical Record Number: [id number] Patient Account Number: [id number] Date of Birth/Sex: Treating RN: Sep 12, 1970 (52 y.o. [name] Primary Care Kaysha Parsell: [name] Other Clinician: Referring Chiyoko Torrico: Treating Milaya Hora/Extender: [name] in Treatment: [place] reviewed with physician [name], Ascencion M ([id number]) [id number] Page 4 of 7 Active Inactive Nutrition Nursing Diagnoses: Impaired glucose control: actual or potential Potential for alteratiion in Nutrition/Potential for imbalanced nutrition Goals: Patient/caregiver will maintain therapeutic glucose control Date Initiated: 07/29/2021 Target Resolution Date: 03/18/2022 Goal Status: Active Interventions: Assess patient nutrition upon admission and as needed per policy Provide education on elevated blood sugars and impact on wound healing Treatment Activities: Patient referred to Primary Care Physician for further nutritional evaluation :  07/29/2021 Notes: Wound/Skin Impairment Nursing Diagnoses: Impaired tissue integrity Goals: Patient/caregiver will verbalize understanding of skin care regimen Date Initiated: 07/04/2021 Target Resolution Date: 03/11/2022 Goal Status: Active Interventions: Assess ulceration(s) every visit Treatment Activities: Skin care regimen initiated : 07/04/2021 Notes: Electronic Signature(s) Signed: 02/25/2022 5:05:54 PM By: [name] RN, BSN Entered By: [name] on 02/25/2022 07:57:10 -------------------------------------------------------------------------------- Pain Assessment Details Patient Name: Date of Service: [place], [name] 02/25/2022 7:30 A M Medical Record Number: [id number] Patient Account Number: [id number] Date of Birth/Sex: Treating RN: [birth year] (52 y.o. [name] Primary Care Kyrah Schiro: [name] Other Clinician: Referring Ravon Mortellaro: Treating Jmari Pelc/Extender: [name] in Treatment: 33 Active Problems Location of Pain Severity and Description of Pain Patient Has Paino No Site Locations Rate the pain. [name], Courtland M ([id number]) [id number] Page 5 of 7 Rate the pain. Current Pain Level: 0 Pain Management and Medication Current Pain Management: Electronic Signature(s) Signed: 02/25/2022 5:05:54 PM By: [name] RN, BSN Entered By: [name]  on 02/25/2022 07:46:03 -------------------------------------------------------------------------------- Patient/Caregiver Education Details Patient Name: Date of Service: [place], [place] [name] 2/6/2024andnbsp7:30 A M Medical Record Number: [id number] Patient Account Number: [id number] Date of Birth/Gender: Treating RN: 29-Nov-1970 (52 y.o. [name] Primary Care Physician: [name] Other Clinician: Referring Physician: Treating Physician/Extender: [name] in Treatment: 47 Education  Assessment Education Provided To: Patient Education Topics Provided Offloading: Methods: Explain/Verbal Responses: Reinforcements needed, State content correctly Wound/Skin Impairment: Methods: Explain/Verbal Responses: Reinforcements needed, State content correctly Electronic Signature(s) Signed: 02/25/2022 5:05:54 PM By: [name] RN, BSN Entered By: [name] on 02/25/2022 07:57:58 -------------------------------------------------------------------------------- Wound Assessment Details Patient Name: Date of Service: [place], [name] 02/25/2022 7:30 A M [name], Alfonso M ([id number]) [id number] Page 6 of 7 Medical Record Number: [id number] Patient Account Number: [id number] Date of Birth/Sex: Treating RN: 02/21/1970 (52 y.o. [name] Primary Care Weaver Tweed: [name] Other Clinician: Referring Montavis Schubring: Treating Furqan Gosselin/Extender: [name] in Treatment: 33 Wound Status Wound Number: 2 Primary Etiology: Diabetic Wound/Ulcer of the Lower Extremity Wound Location: Right, Lateral, Plantar Foot Wound Status: Open Wounding Event: Gradually Appeared Comorbid History: Type II Diabetes Date Acquired: 12/19/2016 Weeks Of Treatment: 33 Clustered Wound: No Photos Wound Measurements Length: (cm) 0.3 Width: (cm) 1.5 Depth: (cm) 1.7 Area: (cm) 0.353 Volume: (cm) 0.601 % Reduction in Area: 90.2% % Reduction in Volume: 96.7% Epithelialization: Small (1-33%) Undermining: Yes Starting Position (o'clock): 12 Ending Position (o'clock): 12 Maximum Distance: (cm) 1.1 Wound Description Classification: Grade 3 Wound Margin: Thickened Exudate Amount: Medium Exudate Type: Serosanguineous Exudate Color: red, brown Foul Odor After Cleansing: No Slough/Fibrino No Wound Bed Granulation Amount: Small (1-33%) Exposed Structure Granulation Quality: Red Fascia Exposed: No Necrotic Amount: Large (67-100%) Fat  Layer (Subcutaneous Tissue) Exposed: Yes Necrotic Quality: Adherent Slough Tendon Exposed: No Muscle Exposed: No Joint Exposed: No Bone Exposed: No Periwound Skin Texture Texture Color No Abnormalities Noted: No No Abnormalities Noted: Yes Callus: Yes Temperature / Pain Temperature: No Abnormality Moisture No Abnormalities Noted: Yes Tenderness on Palpation: Yes Treatment Notes Wound #2 (Foot) Wound Laterality: Plantar, Right, Lateral Cleanser Soap and Water Discharge Instruction: May shower and wash wound with dial antibacterial soap and water prior to dressing change. Wound Cleanser Discharge Instruction: Cleanse the wound with wound cleanser prior to applying a clean dressing using gauze sponges, not tissue or cotton balls. Peri-Wound Care cotton tipped applicators [name], Harlan Stacy ([id number]) [id number] Page 7 of 7 Topical Primary Dressing Epicord Secondary Dressing ADAPTIC TOUCH 3x4.25 in Discharge Instruction: Apply over primary dressing as directed. Optifoam Non-Adhesive Dressing, 4x4 in Discharge Instruction: Apply over primary dressing as directed. Woven Gauze Sponges 2x2 in Discharge Instruction: moistened with saline. Apply over primary dressing as directed. Secured With Paper Tape, 2x10 (in/yd) Discharge Instruction: Secure dressing with tape as directed. Compression Wrap Kerlix Roll 4.5x3.1 (in/yd) Discharge Instruction: Apply Kerlix and Coban compression as directed. Compression Stockings Add-Ons Electronic Signature(s) Signed: 02/25/2022 5:05:54 PM By: [name] RN, BSN Entered By: [name] on 02/25/2022 07:55:44 -------------------------------------------------------------------------------- Vitals Details Patient Name: Date of Service: [name], [name] 02/25/2022 7:30 A M Medical Record Number: [id number] Patient Account Number: [id number] Date of Birth/Sex: Treating RN: [birth year] (52 y.o. [name] Primary Care Cherokee Boccio: [name] Other Clinician: Referring Keiran Sias: Treating Niyana Chesbro/Extender: [name] in Treatment: 33 Vital Signs Time Taken: 07:44 Temperature (F): 98.3 Height (in): 72 Pulse (bpm): 73 Weight (lbs): 312 Respiratory Rate (breaths/min): 18 Body Mass Index (BMI): 42.3  Blood Pressure (mmHg): 166/97 Capillary Blood Glucose (mg/dl): 101 Reference Range: 80 - 120 mg / dl Notes glucose per pt report this am Electronic Signature(s) Signed: 02/25/2022 5:05:54 PM By: [name] RN, BSN Entered By: [name] on 02/25/2022 07:45:00

## 2022-03-03 ENCOUNTER — Encounter (HOSPITAL_BASED_OUTPATIENT_CLINIC_OR_DEPARTMENT_OTHER): Payer: BLUE CROSS/BLUE SHIELD | Admitting: General Surgery

## 2022-03-03 DIAGNOSIS — E11621 Type 2 diabetes mellitus with foot ulcer: Secondary | ICD-10-CM | POA: Diagnosis not present

## 2022-03-04 ENCOUNTER — Ambulatory Visit (HOSPITAL_BASED_OUTPATIENT_CLINIC_OR_DEPARTMENT_OTHER): Payer: BLUE CROSS/BLUE SHIELD | Admitting: General Surgery

## 2022-03-04 NOTE — Progress Notes (Signed)
[name] [name], [name] [name] ([id number]) [id number] Page 1 of 8 Visit Report for 03/03/2022 Arrival Information Details Patient Name: Date of Service: [place], [place] RD [name] 03/03/2022 7:30 A [name] Patient Account Number: [id number] Date of Birth/Sex: Treating RN: 1970-10-29 (52 y.o. [name] [name] Primary Care [name] [name]: [name] [name] Other Clinician: Referring [name] [name]: Treating [name] [name]/Extender: [name] [name] in Treatment: 71 Visit Information History Since Last Visit Added or deleted any medications: No Patient Arrived: Ambulatory Any new allergies or adverse reactions: No Arrival Time: 07:39 Had a fall or experienced change in No Accompanied By: self activities of daily living that may affect Transfer Assistance: None risk of falls: Patient Identification Verified: Yes Signs or symptoms of abuse/neglect since last visito No Secondary Verification Process Completed: Yes Hospitalized since last visit: No Patient Requires Transmission-Based Precautions: No Implantable device outside of the clinic excluding No Patient Has Alerts: Yes cellular tissue based products placed in the center since last visit: Has Dressing in Place as Prescribed: Yes Pain Present Now: Yes Electronic Signature(s) Signed: 03/03/2022 5:22:41 PM By: [name] RN, BSN Entered By: [name] on 03/03/2022 07:40:14 -------------------------------------------------------------------------------- Encounter Discharge Information Details Patient Name: Date of Service: [name], [name] [name] 03/03/2022 7:30 A [name] Patient Account Number: [id number] Date of Birth/Sex: Treating RN: 14-Jan-1971 (52 y.o. [name] [name] Primary Care [name] [name]: [name] [name] Other Clinician: Referring [name] [name]: Treating [name] [name]/Extender: [name] [name] in Treatment: 34 Encounter Discharge  Information Items Post Procedure Vitals Discharge Condition: Stable Temperature (F): 98.1 Ambulatory Status: Ambulatory Pulse (bpm): 70 Discharge Destination: Home Respiratory Rate (breaths/min): 18 Transportation: Private Auto Blood Pressure (mmHg): 173/96 Accompanied By: self Schedule Follow-up Appointment: Yes Clinical Summary of Care: Patient Declined Electronic Signature(s) Signed: 03/03/2022 5:22:41 PM By: [name] RN, BSN Entered By: [name] on 03/03/2022 08:15:27 [name] [name], [name] [name] ([id number]) [id number] Page 2 of 8 -------------------------------------------------------------------------------- Lower Extremity Assessment Details Patient Name: Date of Service: [name], [name] [name] 03/03/2022 7:30 A [name] Patient Account Number: [id number] Date of Birth/Sex: Treating RN: [birth year] (52 y.o. [name] [name] Primary Care Jirah Rider: [name] [name] Other Clinician: Referring Latavion Halls: Treating Leatta Alewine/Extender: [name] [name] in Treatment: 34 Edema Assessment Assessed: [name]: No] [Right: No] Edema: [Left: N] [Right: o] Calf Left: Right: Point of Measurement: From Medial Instep 38 cm Ankle Left: Right: Point of Measurement: From Medial Instep 23 cm Vascular Assessment Pulses: Dorsalis Pedis Palpable: [Right:No] Electronic Signature(s) Signed: 03/03/2022 5:22:41 PM By: [name] RN, BSN Entered By: [name] on 03/03/2022 07:43:17 -------------------------------------------------------------------------------- Multi Wound Chart Details Patient Name: Date of Service: [name], [name] [name] 03/03/2022 7:30 A [name] Patient Account Number: [id number] Date of Birth/Sex: Treating RN: 12-12-1970 (52 y.o. [name] Primary Care Dewell Monnier: [name] [name] Other Clinician: Referring Maribel Luis: Treating Stillman Buenger/Extender: [name] [name] in Treatment: 34 Vital Signs Height(in): 72 Capillary Blood Glucose(mg/dl): 175 Weight(lbs): 312 Pulse(bpm): 52 Body Mass Index(BMI): 42.3 Blood Pressure(mmHg): 173/96 Temperature(F): 98.1 Respiratory Rate(breaths/min): 18 [2:Photos:] [N/A:N/A] Right, Lateral, Plantar Foot N/A N/A Wound Location: Gradually Appeared N/A N/A Wounding Event: Diabetic Wound/Ulcer of the Lower N/A N/A Primary Etiology: Extremity Type II Diabetes N/A N/A Comorbid History: 12/19/2016 N/A N/A Date Acquired: 83 N/A N/A Weeks of Treatment: Open N/A N/A Wound Status: No N/A N/A Wound Recurrence: 0.5x1.3x1.4 N/A N/A Measurements L x W x D (cm) 0.511 N/A N/A A (cm) : rea 0.715 N/A N/A Volume (cm) :  85.90% N/A N/A % Reduction in A rea: 96.00% N/A N/A % Reduction in Volume: 12 Starting Position 1 (o'clock): 12 Ending Position 1 (o'clock): 1 Maximum Distance 1 (cm): Yes N/A N/A Undermining: Grade 3 N/A N/A Classification: Medium N/A N/A Exudate A mount: Serosanguineous N/A N/A Exudate Type: red, brown N/A N/A Exudate Color: Thickened N/A N/A Wound Margin: Small (1-33%) N/A N/A Granulation A mount: Pink N/A N/A Granulation Quality: Large (67-100%) N/A N/A Necrotic A mount: Fat Layer (Subcutaneous Tissue): Yes N/A N/A Exposed Structures: Bone: Yes Fascia: No Tendon: No Muscle: No Joint: No None N/A N/A Epithelialization: Debridement - Selective/Open Wound N/A N/A Debridement: Pre-procedure Verification/Time Out 07:55 N/A N/A Taken: Lidocaine 4% T opical Solution N/A N/A Pain Control: Callus, Slough N/A N/A Tissue Debrided: Skin/Epidermis N/A N/A Level: 1.2 N/A N/A Debridement A (sq cm): rea Curette N/A N/A Instrument: Minimum N/A N/A Bleeding: Pressure N/A N/A Hemostasis A chieved: 3 N/A N/A Procedural Pain: 1 N/A N/A Post Procedural Pain: Procedure was tolerated well N/A N/A Debridement Treatment Response: 0.5x1.3x1.4 N/A N/A Post  Debridement Measurements L x W x D (cm) 0.715 N/A N/A Post Debridement Volume: (cm) Callus: Yes N/A N/A Periwound Skin Texture: No Abnormalities Noted N/A N/A Periwound Skin Moisture: No Abnormalities Noted N/A N/A Periwound Skin Color: No Abnormality N/A N/A Temperature: Yes N/A N/A Tenderness on Palpation: Cellular or Tissue Based Product N/A N/A Procedures Performed: Debridement Treatment Notes Electronic Signature(s) Signed: 03/03/2022 8:12:42 AM By: [name] MD FACS Entered By: [name] on 03/03/2022 08:12:42 -------------------------------------------------------------------------------- Multi-Disciplinary Care Plan Details Patient Name: Date of Service: [name], [name] [name] 03/03/2022 7:30 A [name] Patient Account Number: [id number] Date of Birth/Sex: Treating RN: January 13, 1971 (52 y.o. [name] [name] Primary Care Abdulaziz Toman: [name] [name] Other Clinician: Referring Jayleena Stille: Treating Elton Heid/Extender: [name] [name] in Treatment: 34 [name] [name], [name] [name] ([id number]) [id number] Page 4 of 8 Multidisciplinary Care Plan reviewed with physician Active Inactive Nutrition Nursing Diagnoses: Impaired glucose control: actual or potential Potential for alteratiion in Nutrition/Potential for imbalanced nutrition Goals: Patient/caregiver will maintain therapeutic glucose control Date Initiated: 07/29/2021 Target Resolution Date: 03/18/2022 Goal Status: Active Interventions: Assess patient nutrition upon admission and as needed per policy Provide education on elevated blood sugars and impact on wound healing Treatment Activities: Patient referred to Primary Care Physician for further nutritional evaluation : 07/29/2021 Notes: Wound/Skin Impairment Nursing Diagnoses: Impaired tissue integrity Goals: Patient/caregiver will verbalize understanding of skin care regimen Date Initiated:  07/04/2021 Target Resolution Date: 03/11/2022 Goal Status: Active Interventions: Assess ulceration(s) every visit Treatment Activities: Skin care regimen initiated : 07/04/2021 Notes: Electronic Signature(s) Signed: 03/03/2022 5:22:41 PM By: [name] RN, BSN Entered By: [name] on 03/03/2022 07:51:58 -------------------------------------------------------------------------------- Pain Assessment Details Patient Name: Date of Service: [place], [name] [name] 03/03/2022 7:30 A [name] Patient Account Number: [id number] Date of Birth/Sex: Treating RN: 1970/11/05 (52 y.o. [name] [name] Primary Care Kimberl Vig: [name] [name] Other Clinician: Referring Kaleyah Labreck: Treating Avyana Puffenbarger/Extender: [name] [name] in Treatment: 34 Active Problems Location of Pain Severity and Description of Pain Patient Has Paino Yes Site Locations Pain Location: [name] [name], [name] [name] ([id number]) [id number] Page 5 of 8 Pain Location: Pain in Ulcers With Dressing Change: Yes Duration of the Pain. Constant / Intermittento Intermittent Rate the pain. Current Pain Level: 0 Worst Pain Level: 4 Least Pain Level: 0 Character of Pain Describe the Pain: Other: sore Pain Management and Medication Current Pain Management: Medication: Yes Is the  Current Pain Management Adequate: Adequate Rest: Yes How does your wound impact your activities of daily livingo Sleep: No Bathing: No Appetite: No Relationship With Others: No Bladder Continence: No Emotions: No Bowel Continence: No Work: No Toileting: No Drive: No Dressing: No Hobbies: No Engineer, maintenance) Signed: 03/03/2022 5:22:41 PM By: [name] RN, BSN Entered By: [name] on 03/03/2022 07:41:30 -------------------------------------------------------------------------------- Patient/Caregiver Education Details Patient Name: Date of Service: [name] [name],  [name] [name] 2/12/2024andnbsp7:30 A [name] Medical Record Number: [id number] Patient Account Number: [id number] Date of Birth/Gender: Treating RN: Mar 30, 1970 (52 y.o. [name] [name] Primary Care Physician: [name] [name] Other Clinician: Referring Physician: Treating Physician/Extender: [name] [name] in Treatment: 34 Education Assessment Education Provided To: Patient Education Topics Provided Elevated Blood Sugar/ Impact on Healing: Methods: Explain/Verbal Responses: Reinforcements needed, State content correctly Offloading: Methods: Explain/Verbal Responses: Reinforcements needed, State content correctly Wound/Skin Impairment: Methods: Explain/Verbal Responses: Reinforcements needed, State content correctly [name] [name], [name] [name] ([id number]) [id number] Page 6 of 8 Electronic Signature(s) Signed: 03/03/2022 5:22:41 PM By: [name] RN, BSN Entered By: [name] on 03/03/2022 07:53:13 -------------------------------------------------------------------------------- Wound Assessment Details Patient Name: Date of Service: [name], [name] [name] 03/03/2022 7:30 A [name] Medical Record Number: [id number] Patient Account Number: [id number] Date of Birth/Sex: Treating RN: Mar 24, 1970 (52 y.o. [name] [name] Primary Care Elese Rane: [name] [name] Other Clinician: Referring Loki Wuthrich: Treating Tahani Potier/Extender: [name] [name] in Treatment: 34 Wound Status Wound Number: 2 Primary Etiology: Diabetic Wound/Ulcer of the Lower Extremity Wound Location: Right, Lateral, Plantar Foot Wound Status: Open Wounding Event: Gradually Appeared Comorbid History: Type II Diabetes Date Acquired: 12/19/2016 Weeks Of Treatment: 34 Clustered Wound: No Photos Wound Measurements Length: (cm) 0.5 Width: (cm) 1.3 Depth: (cm) 1.4 Area: (cm) 0.511 Volume: (cm) 0.715 % Reduction in Area: 85.9% % Reduction in Volume:  96% Epithelialization: None Tunneling: No Undermining: Yes Starting Position (o'clock): 12 Ending Position (o'clock): 12 Maximum Distance: (cm) 1 Wound Description Classification: Grade 3 Wound Margin: Thickened Exudate Amount: Medium Exudate Type: Serosanguineous Exudate Color: red, brown Foul Odor After Cleansing: No Slough/Fibrino No Wound Bed Granulation Amount: Small (1-33%) Exposed Structure Granulation Quality: Pink Fascia Exposed: No Necrotic Amount: Large (67-100%) Fat Layer (Subcutaneous Tissue) Exposed: Yes Necrotic Quality: Adherent Slough Tendon Exposed: No Muscle Exposed: No Joint Exposed: No Bone Exposed: Yes [address] [name] [name], [name] [name] ([id number]) [id number] Page 7 of 8 No Abnormalities Noted: No No Abnormalities Noted: Yes Callus: Yes Temperature / Pain Temperature: No Abnormality Moisture No Abnormalities Noted: Yes Tenderness on Palpation: Yes Treatment Notes Wound #2 (Foot) Wound Laterality: Plantar, Right, Lateral Cleanser Soap and Water Discharge Instruction: May shower and wash wound with dial antibacterial soap and water prior to dressing change. Wound Cleanser Discharge Instruction: Cleanse the wound with wound cleanser prior to applying a clean dressing using gauze sponges, not tissue or cotton balls. Peri-Wound Care cotton tipped applicators Topical Primary Dressing Promogran Prisma Matrix, 4.34 (sq in) (silver collagen) Discharge Instruction: Moisten collagen with saline or hydrogel Epicord Secondary Dressing ADAPTIC TOUCH 3x4.25 in Discharge Instruction: Apply over primary dressing as directed. Optifoam Non-Adhesive Dressing, 4x4 in Discharge Instruction: Apply over primary dressing as directed. Woven Gauze Sponges 2x2 in Discharge Instruction: moistened with saline. Apply over primary dressing as directed. Secured With Paper Tape, 2x10 (in/yd) Discharge Instruction: Secure  dressing with tape as directed. Compression Wrap Kerlix Roll 4.5x3.1 (in/yd) Discharge Instruction: Apply Kerlix and Coban compression as directed. Compression Stockings Add-Ons Electronic Signature(s) Signed: 03/03/2022 5:22:41 PM By: Johna Roles,  Jacques Navy, BSN Entered By: [name] on 03/03/2022 07:48:38 -------------------------------------------------------------------------------- [place] Details Patient Name: Date of Service: [name], [name] [name] 03/03/2022 7:30 A [name] Patient Account Number: [id number] Date of Birth/Sex: Treating RN: [birth year] (52 y.o. [name] [name] Primary Care Yvonnie Schinke: [name] [name] Other Clinician: Referring Raenah Murley: Treating Leathia Farnell/Extender: [name] [name] in Treatment: 34 Vital Signs Time Taken: 07:40 Temperature (F): 98.1 Height (in): 72 Pulse (bpm): 70 Weight (lbs): 312 Respiratory Rate (breaths/min): 18 Body Mass Index (BMI): 42.3 Blood Pressure (mmHg): 173/96 Capillary Blood Glucose (mg/dl): 175 Reference Range: 80 - 120 mg / dl [name] [name], [name] [name] ([id number]) [id number] Page 8 of 8 Notes glucose per pt report this am Electronic Signature(s) Signed: 03/03/2022 5:22:41 PM By: [name] RN, BSN Entered By: [name] on 03/03/2022 07:40:55

## 2022-03-04 NOTE — Progress Notes (Signed)
[name], [name] ([id number]) [id number] Page 1 of 11 Visit Report for 03/03/2022 Chief Complaint Document Details Patient Name: Date of Service: [name], [place] [name] 03/03/2022 7:30 [name] Medical Record Number: [id number] Patient Account Number: [id number] Date of Birth/Sex: Treating RN: 31-May-1970 (52 y.o. [name] Primary Care Provider: Garret [name] Other Clinician: Referring Provider: Treating Provider/Extender: [name] in Treatment: 34 Information Obtained from: Patient Chief Complaint Patients presents for treatment of an open diabetic ulcer Electronic Signature(s) Signed: 03/03/2022 8:14:30 AM By: [name] Maudlin MD FACS Entered By: [name] on 03/03/2022 08:14:30 -------------------------------------------------------------------------------- Cellular or Tissue Based Product Details Patient Name: Date of Service: [name], [name] 03/03/2022 7:30 [name] Medical Record Number: [id number] Patient Account Number: [id number] Date of Birth/Sex: Treating RN: 24-Feb-1970 (52 y.o. [name] Mention Primary Care Provider: Garret [name] Other Clinician: Referring Provider: Treating Provider/Extender: [name] in Treatment: 34 Cellular or Tissue Based Product Type Wound #2 Right,Lateral,Plantar Foot Applied to: Performed By: Physician [name] Maudlin, MD Cellular or Tissue Based Product Type: Epicord Level of Consciousness (Pre-procedure): Awake and Alert Pre-procedure Verification/Time Out Yes - 08:00 Taken: Location: genitalia / hands / feet / multiple digits Wound Size (sq cm): 0.65 Product Size (sq cm): 6 Waste Size (sq cm): 0 Amount of Product Applied (sq cm): 6 Instrument Used: Forceps Lot #: [id number] Order #: 2 Expiration Date: 07/21/2026 Fenestrated: No Reconstituted: Yes Solution Type: saline Solution Amount: 5 ml Lot #: [id number] Solution Expiration Date:  06/19/2024 Secured: Yes Secured With: Steri-Strips Dressing Applied: Yes Primary Dressing: prisma, gauze, adaptic Procedural Pain: 3 Post Procedural Pain: 0 Response to Treatment: Procedure was tolerated well Level of Consciousness (Post- Awake and [address] [name], [name] ([id number]) [id number] Page 2 of 11 procedure): Post Procedure Diagnosis Same as Pre-procedure Electronic Signature(s) Signed: 03/03/2022 8:41:39 AM By: [name] Maudlin MD FACS Signed: 03/03/2022 5:22:41 PM By: [name] Gouty RN, BSN Entered By: [name] on 03/03/2022 08:04:49 -------------------------------------------------------------------------------- Debridement Details Patient Name: Date of Service: [name], [name] 03/03/2022 7:30 [name] Medical Record Number: [id number] Patient Account Number: [id number] Date of Birth/Sex: Treating RN: March 23, 1970 (52 y.o. [name], [name] Primary Care Provider: Garret [name] Other Clinician: Referring Provider: Treating Provider/Extender: [name] in Treatment: 34 Debridement Performed for Assessment: Wound #2 Right,Lateral,Plantar Foot Performed By: Physician [name] Maudlin, MD Debridement Type: Debridement Severity of Tissue Pre Debridement: Necrosis of bone Level of Consciousness (Pre-procedure): Awake and Alert Pre-procedure Verification/Time Out Yes - 07:55 Taken: Start Time: 07:57 Pain Control: Lidocaine 4% T opical Solution T Area Debrided (L x W): otal 0.8 (cm) x 1.5 (cm) = 1.2 (cm) Tissue and other material debrided: Non-Viable, Callus, Slough, Skin: Epidermis, Slough Level: Skin/Epidermis Debridement Description: Selective/Open Wound Instrument: Curette Bleeding: Minimum Hemostasis Achieved: Pressure Procedural Pain: 3 Post Procedural Pain: 1 Response to Treatment: Procedure was tolerated well Level of Consciousness (Post- Awake and Alert procedure): Post Debridement Measurements  of Total Wound Length: (cm) 0.5 Width: (cm) 1.3 Depth: (cm) 1.4 Volume: (cm) 0.715 Character of Wound/Ulcer Post Debridement: Improved Severity of Tissue Post Debridement: Necrosis of bone Post Procedure Diagnosis Same as Pre-procedure Notes scribed by [name] Gouty, RN for Dr. Celine [name] Electronic Signature(s) Signed: 03/03/2022 8:41:39 AM By: [name] Maudlin MD FACS Signed: 03/03/2022 5:22:41 PM By: [name] Gouty RN, BSN Entered By: [name] on 03/03/2022 08:00:07 [name], [name] ([id number]) [id number] Page 3 of 11 -------------------------------------------------------------------------------- HPI Details Patient Name: Date of Service: [name], [name] 03/03/2022  7:30 [name] Medical Record Number: [id number] Patient Account Number: [id number] Date of Birth/Sex: Treating RN: 10/01/1970 (52 y.o. 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 The most recent amputation was in August 2022. At that operation, antibiotic beads were placed in the wound. He has been managed by podiatry for his procedures and management of his wounds. He has been in a Water engineer. He is on oral doxycycline. They have been using Betadine wet to dry dressings along with Iodosorb. The patient states that when he thinks the wound is getting too dry, he applies topical Neosporin. At his last visit, on June 7 of this year, the podiatrist determined that he felt the wound was stalled and referred him to wound care for additional evaluation and management. An MRI has been ordered, but not yet scheduled or performed.  Pathology from his operation in August 2022 demonstrated findings consistent with chronic osteomyelitis. Today, there is a large irregular wound on the plantar surface of his right foot, at about the level of the fifth metatarsal head. This tracks through to a pinpoint opening on the dorsal lateral portion of his foot. The intake nurse reported purulent drainage. There is some malodor from the wound. No frank necrosis identified. 07/11/2021: Today, the wounds do not connect. I attempted multiple times from various directions and the shared tunnel is no longer open. He has some slough accumulation on the dorsal part of his foot as well as slough and callus buildup on the plantar surface. His MRI is scheduled for June 29. No purulent drainage or malodor appreciated today. 07/19/2021: The lateral foot wound has closed and there is no tunnel connecting the plantar foot wound to that site. The plantar foot wound still probes quite deeply, however. There is some slough, eschar, and nonviable tissue accumulated in the wound bed. No malodorous drainage present. His MRI was performed yesterday and is consistent with fairly extensive osteomyelitis. 07/29/2021: The patient has an appointment with infectious disease on July 18 to treat his osteomyelitis. The plantar wound still probes quite deeply, approaching bone. The orifice has narrowed quite substantially, however, making it more difficult for him to pack. 08/05/2021: The tunnel connecting the lateral foot wound to the plantar foot wound has reopened. He sees infectious disease tomorrow to discuss long-term antibiotic treatment for osteomyelitis. There was a bit of murky drainage in the wound, but this was noted after he had had topical lidocaine applied so may have just been a blob of the anesthetic. No odor or frank purulent drainage. The wound probes deeply at the midfoot approaching bone. He does have MRI results confirming his diagnosis of osteomyelitis.  He has failed to progress with conventional treatment and I think his best chance for preservation of the foot is to initiate hyperbaric oxygen therapy. 08/13/2021: The tunnel connecting the 2 wounds has closed again. He has a PICC line and is getting IV daptomycin and cefepime. EKG and chest x-ray are within normal limits. He is scheduled to start hyperbaric oxygen therapy tomorrow. The wound in his midfoot probes deeply, approaching bone. The skin at the orifice continues to heaped up and threatens to close over despite the large cavity within. No erythema, induration,  or purulent drainage. The wound on his lateral foot is fairly small and quite clean. 08/19/2021: The lateral foot wound has nearly closed. The wound in his midfoot does not probe quite as deeply today. The skin at the orifice continues to try to roll in and obscure the opening. No frankly necrotic tissue appreciated. He is tolerating hyperbaric oxygen therapy. 08/26/2021: The lateral foot wound has closed completely. The wound in his midfoot is shallower again today. The wound orifice is contracting. We are using gentamicin and silver alginate. He continues on IV daptomycin and cefepime and is tolerating hyperbaric oxygen therapy without difficulty. 09/02/2021: His foot is a little bit sore today but he was up walking on it all weekend doing back to school shopping with his daughter. The tunneling is down to about 3 cm and I do not appreciate bone at the tip of the probe. The wound is clean but has some callus creating an overhanging lip at the distal aspect. He continues to receive hyperbaric oxygen therapy as well as IV daptomycin and cefepime. 09/09/2021: His wound continues to improve. The tunnel has come in by 0.9 cm. He continues to form callus around the orifice of the wound. He is tolerating hyperbaric oxygen therapy along with his IV antibiotics. 09/16/2021: The tunneling is down to just half a centimeter. He continues to build up  callus around the orifice of the wound. He completed his course of IV antibiotics and his PICC line is scheduled to be removed this Friday. He is tolerating hyperbaric oxygen without difficulty. We have been applying topical gentamicin and silver alginate to his wound. 09/24/2021: The wound depth has come in even further. There is a little bit of callus buildup around the orifice. The opening is quite narrow, at this point. 09/30/2021: The wound depth is about the same this week. The opening continues to contract with callus accumulation. There is some discoloration of the skin on the lateral part of his foot and he admitted to walking more than usual this weekend and also wearing a different pair shoes. He continues to tolerate hyperbaric oxygen therapy without difficulty. 10/07/2021: The wound depth has contracted a bit and is now approximately 1 to 1.5 cm. The orifice of the wound continues to try and close in over the space. There is just some callus and skin around the margins obscuring the orifice. 10/14/2021: The wound depth has come in a little bit more. The orifice of the wound is rolling inwards with epithelium and callus, making it difficult to pack the wound. 10/21/2021: There is still 1 portion of the wound, at the lateral aspect, that is still a couple of centimeters deep. The architecture of the wound makes this somewhat challenging to access and pack. Everything else looks clean. He has his usual accumulation of callus and skin, narrowing the orifice. 10/29/2021: No significant change to the depth of the wound. The orifice continues to contract with callus and skin narrowing the opening. His wife has been packing the wound and doing an excellent job. [name], [name] ([id number]) [id number] Page 4 of 11 11/05/2021: The depth of the wound has come in by about a centimeter. There is less callus accumulation around the orificedespite this, the wound is narrower  today. We are still awaiting insurance approval for Kerecis powder. 11/12/2021: The depth of the wound continues to contract. He has accumulated some callus around the orifice, as usual. We have received a verbal confirmation that he is approved for Kerecis, but no formal  paperwork has yet been received. 11/19/2021: The depth continues to come in. There is callus accumulation around the orifice, as usual. He has been formally approved for Kerecis, but unfortunately we do not have a billing mechanism for the powder in [facility] yet. We are working to address this so we can begin treatment. 11/25/2021: Continued filling in of the depth of the wound. Continued buildup of callus around the orifice. No concern for infection. As we do not have a way to bill for Kerecis powder, but are capable of billing for the [facility] sheet, we are going to use that on him instead. 12/03/2021: The depth of the wound continues to fill in. As usual, he has accumulated callus and skin around the orifice of the wound. No concern for infection. He will complete his hyperbaric oxygen therapy this week. 12/17/2021: The wound depth continues to contract. There is callus and skin accumulation around the orifice of the wound, as per usual. There is more epithelium on the actual wound surface. Unfortunately, his Leroy Kennedy was not ordered. 12/24/2021: The wound depth has come in a little bit more this week. There is less skin accumulation around the orifice, but still some callus. On questioning, he admits to spending a fair amount of time on his feet. 12/12; this is a very difficult wound to assess size slitlike wound with some depth but minimal opening. I applied Kerecis No. 4 12/18; Kerecis No. 5 01/15/2022: His wound measured deeper today. There is also some evidence of pressure induced tissue injury around the lateral aspect of his foot. The patient reports that he was wearing different shoes than usual and felt like his foot was  rolling a lot over the weekend. There is heavy callus around the orifice of the wound. No concern for infection. 01/21/2022: The depth decreased today from 1.7 to 1.0 cm. Significantly less tissue damage as compared to last week. Still with callus and skin accumulation around the orifice. 01/29/2022: The wound continues to contract and the depth is coming in further. We are still working on getting [employer] for him. 02/04/2022: No real change this week. Still working on the [employer]. 02/11/2022: No significant changes again. We are still working on the [employer]. 02/18/2022: No change to the wound. We have submitted in IVR for [employer]. 02/25/2022: Unfortunately, his wound has deteriorated over the past week. It is deeper and is approaching bone once again. There is more slough accumulation and the tissue shows signs of pressure injury. He says that he has been trying to wear his new work boots, which are waterproof. He says they make his feet sweat excessively. I think our main challenge with him is that we just are not able to adequately offload him while he is still working and on his feet through much of the day. He is willing to consider total contact casting, but will need to make some arrangements with work. On a more positive note, however, he has been approved for Epicord. 03/03/2022: The wound probed a little bit deeper again today. We are planning to put him in a total contact cast as soon as he has cleared it with work to take some time off. Electronic Signature(s) Signed: 03/03/2022 8:15:08 AM By: [name] Maudlin MD FACS Entered By: [name] on 03/03/2022 08:15:08 -------------------------------------------------------------------------------- Physical Exam Details Patient Name: Date of Service: [name], [name] 03/03/2022 7:30 [name] Patient Account Number: [id number] Date of Birth/Sex: Treating RN: May 31, 1970 (52 y.o. [name]  Primary  Care Provider: Garret [name] Other Clinician: Referring Provider: Treating Provider/Extender: [name] in Treatment: 34 Constitutional Hypertensive, asymptomatic. . . . no acute distress. Respiratory Normal work of breathing on room air. Notes 03/03/2022: The wound probed a little bit deeper again today. Electronic Signature(s) Signed: 03/03/2022 8:15:36 AM By: [name] Maudlin MD FACS [name], [name] ([id number]) [id number] Page 5 of 11 Entered By: [name] on 03/03/2022 08:15:35 -------------------------------------------------------------------------------- Physician Orders Details Patient Name: Date of Service: [name], [name] 03/03/2022 7:30 [name] Medical Record Number: [id number] Patient Account Number: [id number] Date of Birth/Sex: Treating RN: 12-24-1970 (52 y.o. 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 - Dr [name] RM 1 Return A Monday 2/19 @ 0730 am plan for cast next week Anesthetic Wound #2 Right,Lateral,Plantar Foot (In clinic) Topical Lidocaine 4% applied to wound bed Cellular or Tissue Based Products Cellular or Tissue Based Product Type: - Epicord #2 daptic or Mepitel. (DO NOT REMOVE). - may Cellular or Tissue Based Product applied to wound bed, secured with steri-strips, cover with A change outer dressing only as needed Bathing/ Shower/ Hygiene May shower with protection but do not get wound dressing(s) wet. Protect  dressing(s) with water repellant cover (for example, large plastic bag) or a cast cover and may then take shower. Off-Loading Other: - Try to not stand on your feet too much, minimize walking Wound Treatment Wound #2 - Foot Wound Laterality: Plantar, Right, Lateral Cleanser: Soap and Water 1 x Per Week/30 Days Discharge Instructions: May shower and wash wound with dial antibacterial soap and water prior to dressing change. Cleanser: Wound Cleanser (Generic) 1 x Per Week/30 Days Discharge Instructions: Cleanse the wound with wound cleanser prior to applying a clean dressing using gauze sponges, not tissue or cotton balls. Peri-Wound Care: cotton tipped applicators (Generic) 1 x Per Week/30 Days Prim Dressing: Promogran Prisma Matrix, 4.34 (sq in) (silver collagen) 1 x Per Week/30 Days ary Discharge Instructions: Moisten collagen with saline or hydrogel Prim Dressing: Epicord ary 1 x Per Week/30 Days Secondary Dressing: ADAPTIC TOUCH 3x4.25 in 1 x Per Week/30 Days Discharge Instructions: Apply over primary dressing as directed. Secondary Dressing: Optifoam Non-Adhesive Dressing, 4x4 in 1 x Per Week/30 Days Discharge Instructions: Apply over primary dressing as directed. Secondary Dressing: Woven Gauze Sponges 2x2 in 1 x Per Week/30 Days Discharge Instructions: moistened with saline. Apply over primary dressing as directed. Secured With: Paper Tape, 2x10 (in/yd) (Generic) 1 x Per Week/30 Days Discharge Instructions: Secure dressing with tape as directed. Compression Wrap: Kerlix Roll 4.5x3.1 (in/yd) (Generic) 1 x Per Week/30 Days [name], [name] ([id number]) [id number] Page 6 of 11 Discharge Instructions: Apply Kerlix and Coban compression as directed. Electronic Signature(s) Signed: 03/03/2022 8:41:39 AM By: [name] Maudlin MD FACS Entered By: [name] on 03/03/2022  08:15:52 -------------------------------------------------------------------------------- Problem List Details Patient Name: Date of Service: [name], [name] 03/03/2022 7:30 [name] Medical Record Number: [id number] Patient Account Number: [id number] Date of Birth/Sex: Treating RN: [birth year] (52 y.o. 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 03/03/2022 7:30 [name] Medical Record Number: [id number] Patient Account Number: [id number] Date of Birth/Sex: Treating RN: [birth year] (52 y.o. 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 The most recent amputation  was in August 2022. At that operation, antibiotic beads were placed in the wound. He has been managed by podiatry for his procedures and management of his wounds. He has been in a Water engineer. He is on oral doxycycline. They have been using Betadine wet to dry dressings along with Iodosorb. The patient states that when he thinks the wound is getting too dry, he applies topical Neosporin. At his last visit, on June 7 of this year, the podiatrist determined that he felt the wound was stalled and referred him to wound care for additional evaluation and management. An MRI has been ordered, but not yet scheduled or performed. Pathology from his operation in August 2022 demonstrated findings consistent with chronic osteomyelitis. Today, there is a large irregular wound on the plantar surface of his right foot, at about the level of the fifth metatarsal head. This tracks through to a pinpoint opening on the dorsal lateral portion of his foot. The intake nurse reported purulent drainage. There is some malodor from the wound. No frank necrosis identified. 07/11/2021: Today, the wounds do not connect. I attempted multiple times from various directions and the shared tunnel is no longer open. He has some slough accumulation on the dorsal part of his foot as well as slough and callus buildup on the plantar surface. His MRI is scheduled for June 29. No purulent drainage or malodor appreciated today. 07/19/2021: The lateral foot wound has closed and there is no tunnel connecting the plantar foot wound to that site. The plantar foot wound still probes quite deeply, however. There is some slough, eschar, and nonviable tissue accumulated in the wound bed. No malodorous drainage present. His MRI was performed yesterday and is consistent with fairly extensive osteomyelitis. 07/29/2021: The patient has an appointment with infectious disease on July 18 to treat his osteomyelitis. The plantar wound still  probes quite deeply, approaching bone. The orifice has narrowed quite substantially, however, making it more difficult for him to pack. 08/05/2021: The tunnel connecting the lateral foot wound to the plantar foot wound has reopened. He sees infectious disease tomorrow to discuss long-term antibiotic treatment for osteomyelitis. There was a bit of murky drainage in the wound, but this was noted after he had had topical lidocaine applied so may have just been a blob of the anesthetic. No odor or frank purulent drainage.  The wound probes deeply at the midfoot approaching bone. He does have MRI results confirming his diagnosis of osteomyelitis. He has failed to progress with conventional treatment and I think his best chance for preservation of the foot is to initiate hyperbaric oxygen therapy. 08/13/2021: The tunnel connecting the 2 wounds has closed again. He has a PICC line and is getting IV daptomycin and cefepime. EKG and chest x-ray are within normal limits. He is scheduled to start hyperbaric oxygen therapy tomorrow. The wound in his midfoot probes deeply, approaching bone. The skin at the orifice continues to heaped up and threatens to close over despite the large cavity within. No erythema, induration, or purulent drainage. The wound on his lateral foot is fairly small and quite clean. 08/19/2021: The lateral foot wound has nearly closed. The wound in his midfoot does not probe quite as deeply today. The skin at the orifice continues to try to roll in and obscure the opening. No frankly necrotic tissue appreciated. He is tolerating hyperbaric oxygen therapy. 08/26/2021: The lateral foot wound has closed completely. The wound in his midfoot is shallower again today. The wound orifice is contracting. We are using gentamicin and silver alginate. He continues on IV daptomycin and cefepime and is tolerating hyperbaric oxygen therapy without difficulty. 09/02/2021: His foot is a little bit sore today but he  was up walking on it all weekend doing back to school shopping with his daughter. The tunneling is down to about 3 cm and I do not appreciate bone at the tip of the probe. The wound is clean but has some callus creating an overhanging lip at the distal aspect. He continues to receive hyperbaric oxygen therapy as well as IV daptomycin and cefepime. 09/09/2021: His wound continues to improve. The tunnel has come in by 0.9 cm. He continues to form callus around the orifice of the wound. He is tolerating hyperbaric oxygen therapy along with his IV antibiotics. 09/16/2021: The tunneling is down to just half a centimeter. He continues to build up callus around the orifice of the wound. He completed his course of IV antibiotics and his PICC line is scheduled to be removed this Friday. He is tolerating hyperbaric oxygen without difficulty. We have been applying topical gentamicin and silver alginate to his wound. 09/24/2021: The wound depth has come in even further. There is a little bit of callus buildup around the orifice. The opening is quite narrow, at this point. 09/30/2021: The wound depth is about the same this week. The opening continues to contract with callus accumulation. There is some discoloration of the skin on the lateral part of his foot and he admitted to walking more than usual this weekend and also wearing a different pair shoes. He continues to tolerate hyperbaric oxygen therapy without difficulty. 10/07/2021: The wound depth has contracted a bit and is now approximately 1 to 1.5 cm. The orifice of the wound continues to try and close in over the space. There is just some callus and skin around the margins obscuring the orifice. 10/14/2021: The wound depth has come in a little bit more. The orifice of the wound is rolling inwards with epithelium and callus, making it difficult to pack the wound. 10/21/2021: There is still 1 portion of the wound, at the lateral aspect, that is still a couple of  centimeters deep. The architecture of the wound makes this somewhat challenging to access and pack. Everything else looks clean. He has his usual accumulation of callus and skin, narrowing the  orifice. 10/29/2021: No significant change to the depth of the wound. The orifice continues to contract with callus and skin narrowing the opening. His wife has been packing the wound and doing an excellent job. 11/05/2021: The depth of the wound has come in by about a centimeter. There is less callus accumulation around the orificeoodespite this, the wound is narrower today. We are still awaiting insurance approval for Kerecis powder. 11/12/2021: The depth of the wound continues to contract. He has accumulated some callus around the orifice, as usual. We have received a verbal confirmation that he is approved for Kerecis, but no formal paperwork has yet been received. 11/19/2021: The depth continues to come in. There is callus accumulation around the orifice, as usual. He has been formally approved for Kerecis, but unfortunately we do not have a billing mechanism for the powder in [facility] yet. We are working to address this so we can begin treatment. 11/25/2021: Continued filling in of the depth of the wound. Continued buildup of callus around the orifice. No concern for infection. As we do not have a way to bill for Kerecis powder, but are capable of billing for the [facility] sheet, we are going to use that on him instead. 12/03/2021: The depth of the wound continues to fill in. As usual, he has accumulated callus and skin around the orifice of the wound. No concern for infection. He will complete his hyperbaric oxygen therapy this week. 12/17/2021: The wound depth continues to contract. There is callus and skin accumulation around the orifice of the wound, as per usual. There is more epithelium on the actual wound surface. Unfortunately, his Leroy Kennedy was not ordered. [name], [name] ([id number])  [id number] Page 8 of 11 12/24/2021: The wound depth has come in a little bit more this week. There is less skin accumulation around the orifice, but still some callus. On questioning, he admits to spending a fair amount of time on his feet. 12/12; this is a very difficult wound to assess size slitlike wound with some depth but minimal opening. I applied Kerecis No. 4 12/18; Kerecis No. 5 01/15/2022: His wound measured deeper today. There is also some evidence of pressure induced tissue injury around the lateral aspect of his foot. The patient reports that he was wearing different shoes than usual and felt like his foot was rolling a lot over the weekend. There is heavy callus around the orifice of the wound. No concern for infection. 01/21/2022: The depth decreased today from 1.7 to 1.0 cm. Significantly less tissue damage as compared to last week. Still with callus and skin accumulation around the orifice. 01/29/2022: The wound continues to contract and the depth is coming in further. We are still working on getting [employer] for him. 02/04/2022: No real change this week. Still working on the [employer]. 02/11/2022: No significant changes again. We are still working on the [employer]. 02/18/2022: No change to the wound. We have submitted in IVR for [place]. 02/25/2022: Unfortunately, his wound has deteriorated over the past week. It is deeper and is approaching bone once again. There is more slough accumulation and the tissue shows signs of pressure injury. He says that he has been trying to wear his new work boots, which are waterproof. He says they make his feet sweat excessively. I think our main challenge with him is that we just are not able to adequately offload him while he is still working and on his feet through much of the day. He is willing  to consider total contact casting, but will need to make some arrangements with work. On a more positive note,  however, he has been approved for Epicord. 03/03/2022: The wound probed a little bit deeper again today. We are planning to put him in a total contact cast as soon as he has cleared it with work to take some time off. Patient History Information obtained from Patient. Social History Never smoker, Marital Status - Married, Alcohol Use - Never, Drug Use - No History, Caffeine Use - Daily - T coffee. ea; Medical History Endocrine Patient has history of Type II Diabetes Hospitalization/Surgery History - Amuptation of 3rd,4th and 5th toes of Right foot;Oral Surgery;Anal Fissure surgery; Cholecystectomy. Objective Constitutional Hypertensive, asymptomatic. no acute distress. Vitals Time Taken: 7:40 AM, Height: 72 in, Weight: 312 lbs, BMI: 42.3, Temperature: 98.1 F, Pulse: 70 bpm, Respiratory Rate: 18 breaths/min, Blood Pressure: 173/96 mmHg, Capillary Blood Glucose: 175 mg/dl. General Notes: glucose per pt report this am Respiratory Normal work of breathing on room air. General Notes: 03/03/2022: The wound probed a little bit deeper again today. Integumentary (Hair, Skin) Wound #2 status is Open. Original cause of wound was Gradually Appeared. The date acquired was: 12/19/2016. The wound has been in treatment 34 weeks. The wound is located on the [place]. The wound measures 0.5cm length x 1.3cm width x 1.4cm depth; 0.511cm^2 area and 0.715cm^3 volume. There is bone and Fat Layer (Subcutaneous Tissue) exposed. There is no tunneling noted, however, there is undermining starting at 12:00 and ending at 12:00 with a maximum distance of 1cm. There is a medium amount of serosanguineous drainage noted. The wound margin is thickened. There is small (1- 33%) pink granulation within the wound bed. There is a large (67-100%) amount of necrotic tissue within the wound bed including Adherent Slough. The periwound skin appearance had no abnormalities noted for moisture. The periwound skin  appearance had no abnormalities noted for color. The periwound skin appearance exhibited: Callus. Periwound temperature was noted as No Abnormality. The periwound has tenderness on palpation. 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 There was a Selective/Open Wound Skin/Epidermis Debridement with a total area of 1.2 sq cm performed by [name] Maudlin, MD. With the following instrument(s): Curette to remove Non-Viable tissue/material. Material removed includes Callus, Slough, and Skin: Epidermis after achieving pain control using Lidocaine 4% Topical Solution. No specimens were taken. A time out was conducted at 07:55, prior to the start of the procedure. A Minimum amount of bleeding was controlled with Pressure. The procedure was tolerated well with a pain level of 3 throughout and a pain level of 1 following the procedure. Post Debridement Measurements: 0.5cm length x 1.3cm width x 1.4cm depth; 0.715cm^3 volume. Character of Wound/Ulcer Post Debridement is improved. Severity of Tissue Post Debridement is: Necrosis of bone. Post procedure Diagnosis Wound #2: Same as Pre-Procedure General Notes: scribed by [name] Gouty, RN for Dr. Celine [name] Pre-procedure diagnosis of Wound #2 is a Diabetic Wound/Ulcer of the Lower Extremity located on the Right,Lateral,Plantar Foot. A skin graft procedure using a bioengineered skin substitute/cellular or tissue based product was performed by [name] Maudlin, MD  with the following  instrument(s): Forceps. Epicord was applied and secured with Steri-Strips. 6 sq cm of product was utilized and 0 sq cm was wasted. Post Application, prisma, gauze, adaptic was applied. A Time Out was conducted at 08:00, prior to the start of the procedure. The procedure was tolerated well with a pain level of 3 throughout and a pain level of 0 following the procedure. Post procedure Diagnosis Wound #2: Same as Pre-Procedure . Plan Follow-up Appointments: Return Appointment in 1 week. - Dr [name] RM 1 Monday 2/19 @ 0730 am plan for cast next week Anesthetic: Wound #2 Right,Lateral,Plantar Foot: (In clinic) Topical Lidocaine 4% applied to wound bed Cellular or Tissue Based Products: Cellular or Tissue Based Product Type: - Epicord #2 Cellular or Tissue Based Product applied to wound bed, secured with steri-strips, cover with Adaptic or Mepitel. (DO NOT REMOVE). - may change outer dressing only as needed Bathing/ Shower/ Hygiene: May shower with protection but do not get wound dressing(s) wet. Protect dressing(s) with water repellant cover (for example, large plastic bag) or a cast cover and may then take shower. Off-Loading: Other: - Try to not stand on your feet too much, minimize walking WOUND #2: - Foot Wound Laterality: Plantar, Right, Lateral Cleanser: Soap and Water 1 x Per Week/30 Days Discharge Instructions: May shower and wash wound with dial antibacterial soap and water prior to dressing change. Cleanser: Wound Cleanser (Generic) 1 x Per Week/30 Days Discharge Instructions: Cleanse the wound with wound cleanser prior to applying a clean dressing using gauze sponges, not tissue or cotton balls. Peri-Wound Care: cotton tipped applicators (Generic) 1 x Per Week/30 Days Prim Dressing: Promogran Prisma Matrix, 4.34 (sq in) (silver collagen) 1 x Per Week/30 Days ary Discharge Instructions: Moisten collagen with saline or hydrogel Prim Dressing: Epicord 1 x Per Week/30  Days ary Secondary Dressing: ADAPTIC TOUCH 3x4.25 in 1 x Per Week/30 Days Discharge Instructions: Apply over primary dressing as directed. Secondary Dressing: Optifoam Non-Adhesive Dressing, 4x4 in 1 x Per Week/30 Days Discharge Instructions: Apply over primary dressing as directed. Secondary Dressing: Woven Gauze Sponges 2x2 in 1 x Per Week/30 Days Discharge Instructions: moistened with saline. Apply over primary dressing as directed. Secured With: Paper T ape, 2x10 (in/yd) (Generic) 1 x Per Week/30 Days Discharge Instructions: Secure dressing with tape as directed. Com pression Wrap: Kerlix Roll 4.5x3.1 (in/yd) (Generic) 1 x Per Week/30 Days Discharge Instructions: Apply Kerlix and Coban compression as directed. 03/03/2022: The wound probed a little bit deeper again today. I used a curette to debride callus, skin, and slough from his wound. I then moistened the Epicort and packed it into his wound all the way to the deepest part of the tunnel. There was a substantial gap on the surface so I used Prisma silver collagen to fill this in. A gauze sponge was used as a bolster. Everything was secured in place with Adaptic and Steri-Strips. His foot was wrapped. We will plan on putting him in a cast next week. Electronic Signature(s) Signed: 03/03/2022 8:16:51 AM By: [name] Maudlin MD FACS Entered By: [name] on 03/03/2022 08:16:51 [name], [name] ([id number]) [id number] Page 10 of 11 -------------------------------------------------------------------------------- HxROS Details Patient Name: Date of Service: [name], [name] 03/03/2022 7:30 [name] Patient Account Number: [id number] Date of Birth/Sex: Treating RN: May 20, 1970 (52 y.o. [name] Primary Care Provider: Garret [name] Other Clinician: Referring Provider: Treating Provider/Extender: [name] in Treatment: 34 Information Obtained  From Patient Endocrine  Medical History: Positive for: Type II Diabetes Immunizations Pneumococcal Vaccine: Received Pneumococcal Vaccination: Yes Received Pneumococcal Vaccination On or After 60th Birthday: No Implantable Devices None Hospitalization / Surgery History Type of Hospitalization/Surgery Amuptation of 3rd,4th and 5th toes of Right foot;Oral Surgery;Anal Fissure surgery; Cholecystectomy Family and Social History Never smoker; Marital Status - Married; Alcohol Use: Never; Drug Use: No History; Caffeine Use: Daily - T coffee; Financial Concerns: No; Food, ea; Clothing or Shelter Needs: No; Support System Lacking: No; Transportation Concerns: No Electronic Signature(s) Signed: 03/03/2022 8:41:39 AM By: [name] Maudlin MD FACS Entered By: [name] on 03/03/2022 08:15:14 -------------------------------------------------------------------------------- SuperBill Details Patient Name: Date of Service: [name], [name] 03/03/2022 Medical Record Number: [id number] Patient Account Number: [id number] Date of Birth/Sex: Treating RN: 1970/10/25 (52 y.o. [name] Primary Care Provider: Garret [name] Other Clinician: Referring Provider: Treating Provider/Extender: [name] in Treatment: 34 Diagnosis Coding ICD-10 Codes Code Description [id number] Non-pressure chronic ulcer of other part of right foot with necrosis of bone E11.49 Type 2 diabetes mellitus with other diabetic neurological complication 123456 Other chronic osteomyelitis, right ankle and foot Facility Procedures : [name], E CPT4 Code: [id number] [name] ([id number] Description: [phone] Epicord 2cm x 3cm - per sqcm ) [id number] Modifier: [id number] Quantity: [id number] Page 11 of 11 : CPT4 Code: [id number] 1 Description: 5275 - SKIN SUB GRAFT FACE/NK/HF/G ICD-10 Diagnosis Description L97.514 Non-pressure chronic ulcer of other part of right foot with necrosis of bon Modifier:  e Quantity: 1 Physician Procedures : CPT4 Code Description Modifier [id number] - WC PHYS LEVEL 4 - EST PT 25 ICD-10 Diagnosis Description L97.514 Non-pressure chronic ulcer of other part of right foot with necrosis of bone M86.671 Other chronic osteomyelitis, right ankle and foot  E11.49 Type 2 diabetes mellitus with other diabetic neurological complication Quantity: 1 : [id number] - WC PHYS SKIN SUB GRAFT FACE/NK/HF/G ICD-10 Diagnosis Description L97.514 Non-pressure chronic ulcer of other part of right foot with necrosis of bone Quantity: 1 Electronic Signature(s) Signed: 03/03/2022 8:17:15 AM By: [name] Maudlin MD FACS Entered By: [name] on 03/03/2022 08:17:15

## 2022-03-10 ENCOUNTER — Encounter (HOSPITAL_BASED_OUTPATIENT_CLINIC_OR_DEPARTMENT_OTHER): Payer: BLUE CROSS/BLUE SHIELD | Admitting: General Surgery

## 2022-03-10 DIAGNOSIS — E11621 Type 2 diabetes mellitus with foot ulcer: Secondary | ICD-10-CM | POA: Diagnosis not present

## 2022-03-11 ENCOUNTER — Ambulatory Visit (HOSPITAL_BASED_OUTPATIENT_CLINIC_OR_DEPARTMENT_OTHER): Payer: BLUE CROSS/BLUE SHIELD | Admitting: General Surgery

## 2022-03-11 NOTE — Progress Notes (Signed)
[name], Yuval M ([id number]) [id number] Page 1 of 8 Visit Report for 03/10/2022 Arrival Information Details Patient Name: Date of Service: [place], [place] [name] 03/10/2022 7:30 A M Medical Record Number: [id number] Patient Account Number: [id number] Date of Birth/Sex: Treating RN: 04-May-1970 (52 y.o. [name] Primary Care Karrington Mccravy: [name] Other Clinician: Referring Kathyjo Briere: Treating Irineo Gaulin/Extender: [name] in Treatment: 75 Visit Information History Since Last Visit Added or deleted any medications: No Patient Arrived: Ambulatory Any new allergies or adverse reactions: No Arrival Time: 07:45 Had a fall or experienced change in No Accompanied By: self activities of daily living that may affect Transfer Assistance: None risk of falls: Patient Identification Verified: Yes Signs or symptoms of abuse/neglect since last visito No Secondary Verification Process Completed: Yes Hospitalized since last visit: No Patient Requires Transmission-Based Precautions: No Implantable device outside of the clinic excluding No Patient Has Alerts: Yes cellular tissue based products placed in the center since last visit: Has Dressing in Place as Prescribed: Yes Pain Present Now: No Electronic Signature(s) Signed: 03/10/2022 5:38:46 PM By: [name] RN, BSN Entered By: [name] on 03/10/2022 07:45:36 -------------------------------------------------------------------------------- Encounter Discharge Information Details Patient Name: Date of Service: [name], [name] 03/10/2022 7:30 A M Medical Record Number: [id number] Patient Account Number: [id number] Date of Birth/Sex: Treating RN: April 03, 1970 (52 y.o. [name] Primary Care Talayla Doyel: [name] Other Clinician: Referring Keiyana Stehr: Treating Irmalee Riemenschneider/Extender: [name] in Treatment: 35 Encounter Discharge Information  Items Post Procedure Vitals Discharge Condition: Stable Temperature (F): 98.4 Ambulatory Status: Ambulatory Pulse (bpm): 76 Discharge Destination: Home Respiratory Rate (breaths/min): 18 Transportation: Private Auto Blood Pressure (mmHg): 165/94 Accompanied By: self Schedule Follow-up Appointment: Yes Clinical Summary of Care: Patient Declined Electronic Signature(s) Signed: 03/10/2022 5:38:46 PM By: [name] RN, BSN Entered By: [name] on 03/10/2022 08:32:15 [name], Melquan M ([id number]) [id number] Page 2 of 8 -------------------------------------------------------------------------------- Lower Extremity Assessment Details Patient Name: Date of Service: [name], [name] 03/10/2022 7:30 A M Medical Record Number: [id number] Patient Account Number: [id number] Date of Birth/Sex: Treating RN: 02-Mar-1970 (52 y.o. [name] Primary Care Keonna Raether: [name] Other Clinician: Referring Leeanne Butters: Treating Xzavior Reinig/Extender: [name] in Treatment: 35 Edema Assessment Assessed: [name]: No] [name]: No] Edema: [Left: N] [Right: o] Calf Left: Right: Point of Measurement: From Medial Instep 38 cm Ankle Left: Right: Point of Measurement: From Medial Instep 23 cm Vascular Assessment Pulses: Dorsalis Pedis Palpable: [Right:No] Electronic Signature(s) Signed: 03/10/2022 5:38:46 PM By: [name] RN, BSN Entered By: [name] on 03/10/2022 07:50:35 -------------------------------------------------------------------------------- Multi Wound Chart Details Patient Name: Date of Service: [name], [name] 03/10/2022 7:30 A M Medical Record Number: [id number] Patient Account Number: [id number] Date of Birth/Sex: Treating RN: 03/22/1970 (52 y.o. M) Primary Care Alaysia Lightle: [name] Other Clinician: Referring Carlina Derks: Treating Neta Upadhyay/Extender: [name] in  Treatment: 35 Vital Signs Height(in): 72 Capillary Blood Glucose(mg/dl): 88 Weight(lbs): 312 Pulse(bpm): 72 Body Mass Index(BMI): 42.3 Blood Pressure(mmHg): 165/94 Temperature(F): 98.4 Respiratory Rate(breaths/min): 18 [2:Photos:] [N/A:N/A] Right, Lateral, Plantar Foot N/A N/A Wound Location: Gradually Appeared N/A N/A Wounding Event: Diabetic Wound/Ulcer of the Lower N/A N/A Primary Etiology: Extremity Type II Diabetes N/A N/A Comorbid History: 12/19/2016 N/A N/A Date Acquired: 35 N/A N/A Weeks of Treatment: Open N/A N/A Wound Status: No N/A N/A Wound Recurrence: 0.4x1.1x1.2 N/A N/A Measurements L x W x D (cm) 0.346 N/A N/A A (cm) : rea 0.415 N/A N/A Volume (cm) :  90.40% N/A N/A % Reduction in A rea: 97.70% N/A N/A % Reduction in Volume: 12 Starting Position 1 (o'clock): 12 Ending Position 1 (o'clock): 0.8 Maximum Distance 1 (cm): Yes N/A N/A Undermining: Grade 3 N/A N/A Classification: Medium N/A N/A Exudate A mount: Serosanguineous N/A N/A Exudate Type: red, brown N/A N/A Exudate Color: Thickened N/A N/A Wound Margin: Medium (34-66%) N/A N/A Granulation A mount: Pink N/A N/A Granulation Quality: Medium (34-66%) N/A N/A Necrotic A mount: Fat Layer (Subcutaneous Tissue): Yes N/A N/A Exposed Structures: Bone: Yes Fascia: No Tendon: No Muscle: No Joint: No None N/A N/A Epithelialization: Debridement - Selective/Open Wound N/A N/A Debridement: Pre-procedure Verification/Time Out 08:00 N/A N/A Taken: Lidocaine 4% Topical Solution N/A N/A Pain Control: Callus N/A N/A Tissue Debrided: Skin/Epidermis N/A N/A Level: 0.44 N/A N/A Debridement A (sq cm): rea Curette N/A N/A Instrument: Minimum N/A N/A Bleeding: Pressure N/A N/A Hemostasis A chieved: 0 N/A N/A Procedural Pain: 0 N/A N/A Post Procedural Pain: Procedure was tolerated well N/A N/A Debridement Treatment Response: 0.5x1.1x1.2 N/A N/A Post Debridement Measurements L  x W x D (cm) 0.518 N/A N/A Post Debridement Volume: (cm) Callus: Yes N/A N/A Periwound Skin Texture: Maceration: Yes N/A N/A Periwound Skin Moisture: No Abnormalities Noted N/A N/A Periwound Skin Color: No Abnormality N/A N/A Temperature: Yes N/A N/A Tenderness on Palpation: Cellular or Tissue Based Product N/A N/A Procedures Performed: Debridement T Contact Cast otal Treatment Notes Electronic Signature(s) Signed: 03/10/2022 8:14:19 AM By: [name] MD FACS Entered By: [name] on 03/10/2022 08:14:19 -------------------------------------------------------------------------------- Multi-Disciplinary Care Plan Details Patient Name: Date of Service: [name], [name] 03/10/2022 7:30 A M Medical Record Number: [id number] Patient Account Number: [id number] Date of Birth/Sex: Treating RN: 01/14/1971 (52 y.o. [name] Primary Care Dyneisha Murchison: [name] Other Clinician: Referring Tahjae Durr: Treating Tazaria Dlugosz/Extender: [name] in Treatment: 35 [name], [place] ([id number]) [id number] Page 4 of 8 Multidisciplinary Care Plan reviewed with physician Active Inactive Nutrition Nursing Diagnoses: Impaired glucose control: actual or potential Potential for alteratiion in Nutrition/Potential for imbalanced nutrition Goals: Patient/caregiver will maintain therapeutic glucose control Date Initiated: 07/29/2021 Target Resolution Date: 03/18/2022 Goal Status: Active Interventions: Assess patient nutrition upon admission and as needed per policy Provide education on elevated blood sugars and impact on wound healing Treatment Activities: Patient referred to Primary Care Physician for further nutritional evaluation : 07/29/2021 Notes: Wound/Skin Impairment Nursing Diagnoses: Impaired tissue integrity Goals: Patient/caregiver will verbalize understanding of skin care regimen Date Initiated:  07/04/2021 Target Resolution Date: 04/01/2022 Goal Status: Active Interventions: Assess ulceration(s) every visit Treatment Activities: Skin care regimen initiated : 07/04/2021 Notes: Electronic Signature(s) Signed: 03/10/2022 5:38:46 PM By: [name] RN, BSN Entered By: [name] on 03/10/2022 07:57:54 -------------------------------------------------------------------------------- Pain Assessment Details Patient Name: Date of Service: [place], [name] 03/10/2022 7:30 A M Medical Record Number: [id number] Patient Account Number: [id number] Date of Birth/Sex: Treating RN: 1970/04/29 (52 y.o. [name] Primary Care Lilyrose Tanney: [name] Other Clinician: Referring Ligia Duguay: Treating Loranzo Desha/Extender: [name] in Treatment: 35 Active Problems Location of Pain Severity and Description of Pain Patient Has Paino No Site Locations Rate the pain. [name], Renard M ([id number]) [id number] Page 5 of 8 Rate the pain. Current Pain Level: 0 Worst Pain Level: 5 Least Pain Level: 0 Character of Pain Describe the Pain: Aching Pain Management and Medication Current Pain Management: Medication: Yes Is the Current Pain Management Adequate: Adequate Rest: Yes How does your wound impact your activities of daily  livingo Sleep: No Bathing: No Appetite: No Relationship With Others: No Bladder Continence: No Emotions: No Bowel Continence: No Work: No Toileting: No Drive: No Dressing: No Hobbies: No Engineer, maintenance) Signed: 03/10/2022 5:38:46 PM By: [name] RN, BSN Entered By: [name] on 03/10/2022 07:46:43 -------------------------------------------------------------------------------- Patient/Caregiver Education Details Patient Name: Date of Service: Horald [place], [name] 2/19/2024andnbsp7:30 A M Medical Record Number: [id number] Patient Account Number: [id number] Date of Birth/Gender:  Treating RN: 01-09-1971 (52 y.o. [name] Primary Care Physician: [name] Other Clinician: Referring Physician: Treating Physician/Extender: [name] in Treatment: 35 Education Assessment Education Provided To: Patient Education Topics Provided Offloading: Methods: Explain/Verbal Responses: Reinforcements needed, State content correctly Wound/Skin Impairment: Methods: Explain/Verbal Responses: Reinforcements needed, State content correctly Electronic Signature(s) Signed: 03/10/2022 5:38:46 PM By: [name] RN, BSN [name], [place] 03/10/2022 5:38:46 PM By: [name] RN, BSN Signed: Jerilynn Mages ([id number]) [id number] Page 6 of 8 Entered By: [name] on 03/10/2022 07:58:15 -------------------------------------------------------------------------------- Wound Assessment Details Patient Name: Date of Service: [name], [name] 03/10/2022 7:30 A M Medical Record Number: [id number] Patient Account Number: [id number] Date of Birth/Sex: Treating RN: 08/27/1970 (52 y.o. [name] Primary Care Emanuel Dowson: [name] Other Clinician: Referring Bary Limbach: Treating Matayah Reyburn/Extender: [name] in Treatment: 35 Wound Status Wound Number: 2 Primary Etiology: Diabetic Wound/Ulcer of the Lower Extremity Wound Location: Right, Lateral, Plantar Foot Wound Status: Open Wounding Event: Gradually Appeared Comorbid History: Type II Diabetes Date Acquired: 12/19/2016 Weeks Of Treatment: 35 Clustered Wound: No Photos Wound Measurements Length: (cm) 0.4 Width: (cm) 1.1 Depth: (cm) 1.2 Area: (cm) 0.346 Volume: (cm) 0.415 % Reduction in Area: 90.4% % Reduction in Volume: 97.7% Epithelialization: None Tunneling: No Undermining: Yes Starting Position (o'clock): 12 Ending Position (o'clock): 12 Maximum Distance: (cm) 0.8 Wound Description Classification: Grade 3 Wound  Margin: Thickened Exudate Amount: Medium Exudate Type: Serosanguineous Exudate Color: red, brown Foul Odor After Cleansing: No Slough/Fibrino No Wound Bed Granulation Amount: Medium (34-66%) Exposed Structure Granulation Quality: Pink Fascia Exposed: No Necrotic Amount: Medium (34-66%) Fat Layer (Subcutaneous Tissue) Exposed: Yes Necrotic Quality: Adherent Slough Tendon Exposed: No Muscle Exposed: No Joint Exposed: No Bone Exposed: Yes Periwound Skin Texture Texture Color No Abnormalities Noted: No No Abnormalities Noted: Yes Callus: Yes Temperature / Pain Temperature: No Abnormality Moisture No Abnormalities Noted: No Tenderness on Palpation: Yes [name], Jaivon M ([id number]) [id number] Page 7 of 8 Maceration: Yes Treatment Notes Wound #2 (Foot) Wound Laterality: Plantar, Right, Lateral Cleanser Soap and Water Discharge Instruction: May shower and wash wound with dial antibacterial soap and water prior to dressing change. Wound Cleanser Discharge Instruction: Cleanse the wound with wound cleanser prior to applying a clean dressing using gauze sponges, not tissue or cotton balls. Peri-Wound Care cotton tipped applicators Topical Primary Dressing Promogran Prisma Matrix, 4.34 (sq in) (silver collagen) Discharge Instruction: Moisten collagen with saline or hydrogel Epicord Secondary Dressing ADAPTIC TOUCH 3x4.25 in Discharge Instruction: Apply over primary dressing as directed. Optifoam Non-Adhesive Dressing, 4x4 in Discharge Instruction: Apply over primary dressing as directed. Woven Gauze Sponges 2x2 in Discharge Instruction: moistened with saline. Apply over primary dressing as directed. Secured With Paper Tape, 2x10 (in/yd) Discharge Instruction: Secure dressing with tape as directed. Compression Wrap Kerlix Roll 4.5x3.1 (in/yd) Discharge Instruction: Apply Kerlix and Coban compression as directed. Compression  Stockings Add-Ons Electronic Signature(s) Signed: 03/10/2022 5:38:46 PM By: [name] RN, BSN Entered By: [name] on 03/10/2022 07:56:45 -------------------------------------------------------------------------------- Vitals Details Patient Name: Date of Service: [place]  [place], [name] 03/10/2022 7:30 A M Medical Record Number: [id number] Patient Account Number: [id number] Date of Birth/Sex: Treating RN: 1970/11/25 (52 y.o. [name] Primary Care Nicolus Ose: [name] Other Clinician: Referring Meara Wiechman: Treating Mellonie Guess/Extender: [name] in Treatment: 35 Vital Signs Time Taken: 07:45 Temperature (F): 98.4 Height (in): 72 Pulse (bpm): 76 Weight (lbs): 312 Respiratory Rate (breaths/min): 18 Body Mass Index (BMI): 42.3 Blood Pressure (mmHg): 165/94 Capillary Blood Glucose (mg/dl): 88 Reference Range: 80 - 120 mg / dl Notes glucose per pt report this am [name], Rjay M ([id number]) [id number] Page 8 of 8 Electronic Signature(s) Signed: 03/10/2022 5:38:46 PM By: [name] RN, BSN Entered By: [name] on 03/10/2022 07:46:19

## 2022-03-11 NOTE — Progress Notes (Signed)
[name] ([id number]) [id number] Page 1 of 11 Visit Report for 03/10/2022 Chief Complaint Document Details Patient Name: Date of Service: [name], [name] [name] 03/10/2022 7:30 [name] Patient Account Number: [id number] Date of Birth/Sex: Treating RN: [birth year] (52 y.o. [name] Primary Care Provider: [name] Other Clinician: Referring Provider: Treating Provider/Extender: [name] in Treatment: 63 Information Obtained from: Patient Chief Complaint Patients presents for treatment of an open diabetic ulcer Electronic Signature(s) Signed: 03/10/2022 8:14:26 AM By: [name] MD FACS Entered By: [name] on 03/10/2022 08:14:26 -------------------------------------------------------------------------------- Cellular or Tissue Based Product Details Patient Name: Date of Service: [name], [name] [name] 03/10/2022 7:30 [name] Patient Account Number: [id number] Date of Birth/Sex: Treating RN: October 29, 1970 (52 y.o. [name] Primary Care Provider: [name] Other Clinician: Referring Provider: Treating Provider/Extender: [name] in Treatment: 46 Cellular or Tissue Based Product Type Wound #2 Right,Lateral,Plantar Foot Applied to: Performed By: Physician [name], MD Cellular or Tissue Based Product Type: Epicord Level of Consciousness (Pre-procedure): Awake and Alert Pre-procedure Verification/Time Out Yes - 08:05 Taken: Location: genitalia / hands / feet / multiple digits Wound Size (sq cm): 0.44 Product Size (sq cm): 6 Waste Size (sq cm): 0 Amount of Product Applied (sq cm): 6 Instrument Used: Forceps, Scissors Lot #: [id number] Order #: 3 Expiration Date: 09/21/2026 Fenestrated: No Reconstituted: Yes Solution Type: saline Solution Amount: 5 ml Lot #: [id number] Solution Expiration Date:  06/19/2024 Secured: Yes Secured With: Steri-Strips Dressing Applied: Yes Primary Dressing: adaptic, gauze Procedural Pain: 0 Post Procedural Pain: 0 Response to Treatment: Procedure was tolerated well Level of Consciousness (Post- Awake and [address] [name] ([id number]) [id number] Page 2 of 11 procedure): Post Procedure Diagnosis Same as Pre-procedure Electronic Signature(s) Signed: 03/10/2022 11:21:45 AM By: [name] MD FACS Signed: 03/10/2022 5:38:46 PM By: [name] RN, BSN Entered By: [name] on 03/10/2022 08:08:53 -------------------------------------------------------------------------------- Debridement Details Patient Name: Date of Service: [name], [name] [name] 03/10/2022 7:30 [name] Patient Account Number: [id number] Date of Birth/Sex: Treating RN: March 08, 1970 (52 y.o. [name] Primary Care Provider: [name] Other Clinician: Referring Provider: Treating Provider/Extender: [name] in Treatment: 35 Debridement Performed for Assessment: Wound #2 Right,Lateral,Plantar Foot Performed By: Physician [name], MD Debridement Type: Debridement Severity of Tissue Pre Debridement: Bone involvement without necrosis Level of Consciousness (Pre-procedure): Awake and Alert Pre-procedure Verification/Time Out Yes - 08:00 Taken: Start Time: 08:01 Pain Control: Lidocaine 4% T opical Solution T Area Debrided (L x W): otal 0.4 (cm) x 1.1 (cm) = 0.44 (cm) Tissue and other material debrided: Non-Viable, Callus, Skin: Epidermis Level: Skin/Epidermis Debridement Description: Selective/Open Wound Instrument: Curette Bleeding: Minimum Hemostasis Achieved: Pressure Procedural Pain: 0 Post Procedural Pain: 0 Response to Treatment: Procedure was tolerated well Level of Consciousness (Post- Awake and Alert procedure): Post Debridement Measurements of  Total Wound Length: (cm) 0.5 Width: (cm) 1.1 Depth: (cm) 1.2 Volume: (cm) 0.518 Character of Wound/Ulcer Post Debridement: Improved Severity of Tissue Post Debridement: Fat layer exposed Post Procedure Diagnosis Same as Pre-procedure Notes scribed by [name], RN for [name] Electronic Signature(s) Signed: 03/10/2022 11:21:45 AM By: [name] MD FACS Signed: 03/10/2022 5:38:46 PM By: [name] RN, BSN Entered By: [name] on 03/10/2022 08:05:40 [name] ([id number]) [id number] Page 3 of 11 -------------------------------------------------------------------------------- HPI Details Patient Name: Date of Service: [name], [name] [name] 03/10/2022 7:30  [name] Medical Record Number: [id number] Patient Account Number: [id number] Date of Birth/Sex: Treating RN: 03/19/1970 (52 y.o. 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 The most recent amputation was in August 2022. At that operation, antibiotic beads were placed in the wound. He has been managed by podiatry for his procedures and management of his wounds. He has been in a Water engineer. He is on oral doxycycline. They have been using Betadine wet to dry dressings along with Iodosorb. The patient states that when he thinks the wound is getting too dry, he applies topical Neosporin. At his last visit, on June 7 of this year, the podiatrist determined that he felt the wound was stalled and referred him to wound care for additional evaluation and management. An MRI has been ordered, but not yet scheduled or performed.  Pathology from his operation in August 2022 demonstrated findings consistent with chronic osteomyelitis. Today, there is a large irregular wound on the plantar surface of his right foot, at about the level of the fifth metatarsal head. This tracks through to a pinpoint opening on the dorsal lateral portion of his foot. The intake nurse reported purulent drainage. There is some malodor from the wound. No frank necrosis identified. 07/11/2021: Today, the wounds do not connect. I attempted multiple times from various directions and the shared tunnel is no longer open. He has some slough accumulation on the dorsal part of his foot as well as slough and callus buildup on the plantar surface. His MRI is scheduled for June 29. No purulent drainage or malodor appreciated today. 07/19/2021: The lateral foot wound has closed and there is no tunnel connecting the plantar foot wound to that site. The plantar foot wound still probes quite deeply, however. There is some slough, eschar, and nonviable tissue accumulated in the wound bed. No malodorous drainage present. His MRI was performed yesterday and is consistent with fairly extensive osteomyelitis. 07/29/2021: The patient has an appointment with infectious disease on July 18 to treat his osteomyelitis. The plantar wound still probes quite deeply, approaching bone. The orifice has narrowed quite substantially, however, making it more difficult for him to pack. 08/05/2021: The tunnel connecting the lateral foot wound to the plantar foot wound has reopened. He sees infectious disease tomorrow to discuss long-term antibiotic treatment for osteomyelitis. There was a bit of murky drainage in the wound, but this was noted after he had had topical lidocaine applied so may have just been a blob of the anesthetic. No odor or frank purulent drainage. The wound probes deeply at the midfoot approaching bone. He does have MRI results confirming his diagnosis of osteomyelitis.  He has failed to progress with conventional treatment and I think his best chance for preservation of the foot is to initiate hyperbaric oxygen therapy. 08/13/2021: The tunnel connecting the 2 wounds has closed again. He has a PICC line and is getting IV daptomycin and cefepime. EKG and chest x-ray are within normal limits. He is scheduled to start hyperbaric oxygen therapy tomorrow. The wound in his midfoot probes deeply, approaching bone. The skin at the orifice continues to heaped up and threatens to close over despite the large cavity within. No erythema, induration, or  purulent drainage. The wound on his lateral foot is fairly small and quite clean. 08/19/2021: The lateral foot wound has nearly closed. The wound in his midfoot does not probe quite as deeply today. The skin at the orifice continues to try to roll in and obscure the opening. No frankly necrotic tissue appreciated. He is tolerating hyperbaric oxygen therapy. 08/26/2021: The lateral foot wound has closed completely. The wound in his midfoot is shallower again today. The wound orifice is contracting. We are using gentamicin and silver alginate. He continues on IV daptomycin and cefepime and is tolerating hyperbaric oxygen therapy without difficulty. 09/02/2021: His foot is a little bit sore today but he was up walking on it all weekend doing back to school shopping with his daughter. The tunneling is down to about 3 cm and I do not appreciate bone at the tip of the probe. The wound is clean but has some callus creating an overhanging lip at the distal aspect. He continues to receive hyperbaric oxygen therapy as well as IV daptomycin and cefepime. 09/09/2021: His wound continues to improve. The tunnel has come in by 0.9 cm. He continues to form callus around the orifice of the wound. He is tolerating hyperbaric oxygen therapy along with his IV antibiotics. 09/16/2021: The tunneling is down to just half a centimeter. He continues to build up  callus around the orifice of the wound. He completed his course of IV antibiotics and his PICC line is scheduled to be removed this Friday. He is tolerating hyperbaric oxygen without difficulty. We have been applying topical gentamicin and silver alginate to his wound. 09/24/2021: The wound depth has come in even further. There is a little bit of callus buildup around the orifice. The opening is quite narrow, at this point. 09/30/2021: The wound depth is about the same this week. The opening continues to contract with callus accumulation. There is some discoloration of the skin on the lateral part of his foot and he admitted to walking more than usual this weekend and also wearing a different pair shoes. He continues to tolerate hyperbaric oxygen therapy without difficulty. 10/07/2021: The wound depth has contracted a bit and is now approximately 1 to 1.5 cm. The orifice of the wound continues to try and close in over the space. There is just some callus and skin around the margins obscuring the orifice. 10/14/2021: The wound depth has come in a little bit more. The orifice of the wound is rolling inwards with epithelium and callus, making it difficult to pack the wound. 10/21/2021: There is still 1 portion of the wound, at the lateral aspect, that is still a couple of centimeters deep. The architecture of the wound makes this somewhat challenging to access and pack. Everything else looks clean. He has his usual accumulation of callus and skin, narrowing the orifice. 10/29/2021: No significant change to the depth of the wound. The orifice continues to contract with callus and skin narrowing the opening. His wife has been packing the wound and doing an excellent job. [name], Reagen [name] ([id number]) [id number] Page 4 of 11 11/05/2021: The depth of the wound has come in by about a centimeter. There is less callus accumulation around the orificedespite this, the wound is narrower  today. We are still awaiting insurance approval for Kerecis powder. 11/12/2021: The depth of the wound continues to contract. He has accumulated some callus around the orifice, as usual. We have received a verbal confirmation that he is approved for Kerecis, but no formal paperwork  has yet been received. 11/19/2021: The depth continues to come in. There is callus accumulation around the orifice, as usual. He has been formally approved for Kerecis, but unfortunately we do not have a billing mechanism for the powder in [facility] yet. We are working to address this so we can begin treatment. 11/25/2021: Continued filling in of the depth of the wound. Continued buildup of callus around the orifice. No concern for infection. As we do not have a way to bill for Kerecis powder, but are capable of billing for the [facility] sheet, we are going to use that on him instead. 12/03/2021: The depth of the wound continues to fill in. As usual, he has accumulated callus and skin around the orifice of the wound. No concern for infection. He will complete his hyperbaric oxygen therapy this week. 12/17/2021: The wound depth continues to contract. There is callus and skin accumulation around the orifice of the wound, as per usual. There is more epithelium on the actual wound surface. Unfortunately, his Leroy Kennedy was not ordered. 12/24/2021: The wound depth has come in a little bit more this week. There is less skin accumulation around the orifice, but still some callus. On questioning, he admits to spending a fair amount of time on his feet. 12/12; this is a very difficult wound to assess size slitlike wound with some depth but minimal opening. I applied Kerecis No. 4 12/18; Kerecis No. 5 01/15/2022: His wound measured deeper today. There is also some evidence of pressure induced tissue injury around the lateral aspect of his foot. The patient reports that he was wearing different shoes than usual and felt like his foot was  rolling a lot over the weekend. There is heavy callus around the orifice of the wound. No concern for infection. 01/21/2022: The depth decreased today from 1.7 to 1.0 cm. Significantly less tissue damage as compared to last week. Still with callus and skin accumulation around the orifice. 01/29/2022: The wound continues to contract and the depth is coming in further. We are still working on getting [employer] for him. 02/04/2022: No real change this week. Still working on the [employer]. 02/11/2022: No significant changes again. We are still working on the [employer]. 02/18/2022: No change to the wound. We have submitted in IVR for [place]. 02/25/2022: Unfortunately, his wound has deteriorated over the past week. It is deeper and is approaching bone once again. There is more slough accumulation and the tissue shows signs of pressure injury. He says that he has been trying to wear his new work boots, which are waterproof. He says they make his feet sweat excessively. I think our main challenge with him is that we just are not able to adequately offload him while he is still working and on his feet through much of the day. He is willing to consider total contact casting, but will need to make some arrangements with work. On a more positive note, however, he has been approved for Epicord. 03/03/2022: The wound probed a little bit deeper again today. We are planning to put him in a total contact cast as soon as he has cleared it with work to take some time off. 03/10/2022: The wound was a little bit shallower today and the overall appearance is significantly improved. We are planning to put him in a cast today. Electronic Signature(s) Signed: 03/10/2022 8:15:15 AM By: [name] MD FACS Entered By: [name] on 03/10/2022 08:15:14 -------------------------------------------------------------------------------- Physical Exam Details Patient Name: Date of Service: [place], [name] [name]  03/10/2022 7:30 [name] Patient Account Number: [id number] Date of Birth/Sex: Treating RN: 08-14-1970 (52 y.o. [name] Primary Care Provider: [name] Other Clinician: Referring Provider: Treating Provider/Extender: [name] in Treatment: 65 Constitutional Hypertensive, asymptomatic. . . . no acute distress. Respiratory Normal work of breathing on room air. Notes 03/10/2022: The wound was a little bit shallower today and the overall appearance is significantly improved. Electronic Signature(s) [name], Shanna [place] ([id number]) [id number] Page 5 of 11 Signed: 03/10/2022 8:15:48 AM By: [name] MD FACS Entered By: [name] on 03/10/2022 08:15:48 -------------------------------------------------------------------------------- Physician Orders Details Patient Name: Date of Service: [name], [name] [name] 03/10/2022 7:30 [name] Patient Account Number: [id number] Date of Birth/Sex: Treating RN: [birth year] (52 y.o. 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 - Dr Celine Ahr RM 2 Return A Monday 2/26 @ 1000 am TCC ppointment in: - Wed 2/21 @ 1000 am RM 2 for initial cast change Return A Anesthetic Wound #2 Right,Lateral,Plantar Foot (In clinic) Topical Lidocaine 4% applied to wound bed Cellular or Tissue Based Products Cellular or Tissue Based Product Type: - Epicord #3 Cellular or Tissue Based  Product applied to wound bed, secured with steri-strips, cover with Adaptic or Mepitel. (DO NOT REMOVE). Bathing/ Shower/ Hygiene May shower with protection but do not get wound dressing(s) wet. Protect dressing(s) with water repellant cover (for example, large plastic bag) or a cast cover and may then take shower. Off-Loading Total Contact Cast to Right Lower Extremity Other: - Try to not stand on your feet too much, minimize walking Wound Treatment Wound #2 - Foot Wound Laterality: Plantar, Right, Lateral Cleanser: Soap and Water 1 x Per Week/30 Days Discharge Instructions: May shower and wash wound with dial antibacterial soap and water prior to dressing change. Cleanser: Wound Cleanser (Generic) 1 x Per Week/30 Days Discharge Instructions: Cleanse the wound with wound cleanser prior to applying a clean dressing using gauze sponges, not tissue or cotton balls. Peri-Wound Care: cotton tipped applicators (Generic) 1 x Per Week/30 Days Prim Dressing: Promogran Prisma Matrix, 4.34 (sq in) (silver collagen) 1 x Per Week/30 Days ary Discharge Instructions: Moisten collagen with saline or hydrogel Prim Dressing: Epicord ary 1 x Per Week/30 Days Secondary Dressing: ADAPTIC TOUCH 3x4.25 in 1 x Per Week/30 Days Discharge Instructions: Apply over primary dressing as directed. Secondary Dressing: Optifoam Non-Adhesive Dressing, 4x4 in 1 x Per Week/30 Days Discharge Instructions: Apply over primary dressing as directed. Secondary Dressing: Woven Gauze Sponges 2x2 in 1 x Per Week/30 Days Discharge Instructions: moistened with saline. Apply over primary dressing as directed. Secured With: Paper Tape, 2x10 (in/yd) (Generic) 1 x Per Week/30 Days Discharge Instructions: Secure dressing with tape as directed. [name] ([id number]) [id number] Page 6 of 11 Compression Wrap: Kerlix Roll 4.5x3.1 (in/yd) (Generic) 1 x Per Week/30 Days Discharge Instructions: Apply Kerlix  and Coban compression as directed. Electronic Signature(s) Signed: 03/10/2022 11:21:45 AM By: [name] MD FACS Entered By: [name] on 03/10/2022 08:17:52 -------------------------------------------------------------------------------- Problem List Details Patient Name: Date of Service: [name], [name]  [name] 03/10/2022 7:30 [name] Medical Record Number: [id number] Patient Account Number: [id number] Date of Birth/Sex: Treating RN: [birth year] (52 y.o. 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 03/10/2022 7:30 [name] Medical Record Number: [id number] Patient Account Number: [id number] Date of Birth/Sex: Treating RN: November 06, 1970 (52 y.o. [name] Primary Care Provider: [name] Other Clinician: Referring Provider: Treating Provider/Extender: [name] in Treatment: [address] 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 The most recent amputation was in August 2022. At that operation, antibiotic beads were placed in the wound. He has been managed by podiatry for his procedures and management of his wounds. He has been in a Water engineer. He is on oral doxycycline. They have been using Betadine wet to dry dressings along with Iodosorb. The patient states that when he thinks the wound is getting too dry, he applies topical Neosporin. At his last visit, on June 7 of this year, the podiatrist determined that he felt the wound was stalled and referred him to wound care for additional evaluation and management. An MRI has been ordered, but not yet scheduled or performed. Pathology from his operation in August 2022 demonstrated findings consistent with chronic osteomyelitis. Today, there is a large irregular wound on the plantar surface of his right foot, at about the level of the fifth metatarsal head. This tracks through to a pinpoint opening on the dorsal lateral portion of his foot. The intake nurse reported purulent drainage. There is some malodor from the wound. No frank necrosis identified. 07/11/2021: Today, the wounds do not connect. I attempted multiple times from various directions and the shared tunnel is no longer open. He has some slough accumulation on the dorsal part of his foot as well as slough and callus buildup on the plantar surface. His MRI is scheduled for June 29. No purulent drainage or malodor appreciated today. 07/19/2021: The lateral foot wound has closed and there is no tunnel connecting the plantar foot wound to that site. The plantar foot wound still probes quite deeply, however. There is some slough, eschar, and nonviable tissue accumulated in the wound bed. No malodorous drainage present. His MRI was performed yesterday and is consistent  with fairly extensive osteomyelitis. 07/29/2021: The patient has an appointment with infectious disease on July 18 to treat his osteomyelitis. The plantar wound still probes quite deeply, approaching bone. The orifice has narrowed quite substantially, however, making it more difficult for him to pack. 08/05/2021: The tunnel connecting the lateral foot wound to the plantar foot wound has reopened. He sees infectious disease tomorrow to discuss long-term  antibiotic treatment for osteomyelitis. There was a bit of murky drainage in the wound, but this was noted after he had had topical lidocaine applied so may have just been a blob of the anesthetic. No odor or frank purulent drainage. The wound probes deeply at the midfoot approaching bone. He does have MRI results confirming his diagnosis of osteomyelitis. He has failed to progress with conventional treatment and I think his best chance for preservation of the foot is to initiate hyperbaric oxygen therapy. 08/13/2021: The tunnel connecting the 2 wounds has closed again. He has a PICC line and is getting IV daptomycin and cefepime. EKG and chest x-ray are within normal limits. He is scheduled to start hyperbaric oxygen therapy tomorrow. The wound in his midfoot probes deeply, approaching bone. The skin at the orifice continues to heaped up and threatens to close over despite the large cavity within. No erythema, induration, or purulent drainage. The wound on his lateral foot is fairly small and quite clean. 08/19/2021: The lateral foot wound has nearly closed. The wound in his midfoot does not probe quite as deeply today. The skin at the orifice continues to try to roll in and obscure the opening. No frankly necrotic tissue appreciated. He is tolerating hyperbaric oxygen therapy. 08/26/2021: The lateral foot wound has closed completely. The wound in his midfoot is shallower again today. The wound orifice is contracting. We are using gentamicin and silver  alginate. He continues on IV daptomycin and cefepime and is tolerating hyperbaric oxygen therapy without difficulty. 09/02/2021: His foot is a little bit sore today but he was up walking on it all weekend doing back to school shopping with his daughter. The tunneling is down to about 3 cm and I do not appreciate bone at the tip of the probe. The wound is clean but has some callus creating an overhanging lip at the distal aspect. He continues to receive hyperbaric oxygen therapy as well as IV daptomycin and cefepime. 09/09/2021: His wound continues to improve. The tunnel has come in by 0.9 cm. He continues to form callus around the orifice of the wound. He is tolerating hyperbaric oxygen therapy along with his IV antibiotics. 09/16/2021: The tunneling is down to just half a centimeter. He continues to build up callus around the orifice of the wound. He completed his course of IV antibiotics and his PICC line is scheduled to be removed this Friday. He is tolerating hyperbaric oxygen without difficulty. We have been applying topical gentamicin and silver alginate to his wound. 09/24/2021: The wound depth has come in even further. There is a little bit of callus buildup around the orifice. The opening is quite narrow, at this point. 09/30/2021: The wound depth is about the same this week. The opening continues to contract with callus accumulation. There is some discoloration of the skin on the lateral part of his foot and he admitted to walking more than usual this weekend and also wearing a different pair shoes. He continues to tolerate hyperbaric oxygen therapy without difficulty. 10/07/2021: The wound depth has contracted a bit and is now approximately 1 to 1.5 cm. The orifice of the wound continues to try and close in over the space. There is just some callus and skin around the margins obscuring the orifice. 10/14/2021: The wound depth has come in a little bit more. The orifice of the wound is rolling  inwards with epithelium and callus, making it difficult to pack the wound. 10/21/2021: There is still 1 portion of the  wound, at the lateral aspect, that is still a couple of centimeters deep. The architecture of the wound makes this somewhat challenging to access and pack. Everything else looks clean. He has his usual accumulation of callus and skin, narrowing the orifice. 10/29/2021: No significant change to the depth of the wound. The orifice continues to contract with callus and skin narrowing the opening. His wife has been packing the wound and doing an excellent job. 11/05/2021: The depth of the wound has come in by about a centimeter. There is less callus accumulation around the orificeoodespite this, the wound is narrower today. We are still awaiting insurance approval for Kerecis powder. 11/12/2021: The depth of the wound continues to contract. He has accumulated some callus around the orifice, as usual. We have received a verbal confirmation that he is approved for Kerecis, but no formal paperwork has yet been received. 11/19/2021: The depth continues to come in. There is callus accumulation around the orifice, as usual. He has been formally approved for Kerecis, but unfortunately we do not have a billing mechanism for the powder in [facility] yet. We are working to address this so we can begin treatment. 11/25/2021: Continued filling in of the depth of the wound. Continued buildup of callus around the orifice. No concern for infection. As we do not have a way to bill for Kerecis powder, but are capable of billing for the [facility] sheet, we are going to use that on him instead. 12/03/2021: The depth of the wound continues to fill in. As usual, he has accumulated callus and skin around the orifice of the wound. No concern for infection. He will complete his hyperbaric oxygen therapy this week. 12/17/2021: The wound depth continues to contract. There is callus and skin accumulation around the  orifice of the wound, as per usual. There is more [name] ([id number]) [id number] Page 8 of 11 epithelium on the actual wound surface. Unfortunately, his Leroy Kennedy was not ordered. 12/24/2021: The wound depth has come in a little bit more this week. There is less skin accumulation around the orifice, but still some callus. On questioning, he admits to spending a fair amount of time on his feet. 12/12; this is a very difficult wound to assess size slitlike wound with some depth but minimal opening. I applied Kerecis No. 4 12/18; Kerecis No. 5 01/15/2022: His wound measured deeper today. There is also some evidence of pressure induced tissue injury around the lateral aspect of his foot. The patient reports that he was wearing different shoes than usual and felt like his foot was rolling a lot over the weekend. There is heavy callus around the orifice of the wound. No concern for infection. 01/21/2022: The depth decreased today from 1.7 to 1.0 cm. Significantly less tissue damage as compared to last week. Still with callus and skin accumulation around the orifice. 01/29/2022: The wound continues to contract and the depth is coming in further. We are still working on getting [employer] for him. 02/04/2022: No real change this week. Still working on the [employer]. 02/11/2022: No significant changes again. We are still working on the [employer]. 02/18/2022: No change to the wound. We have submitted in IVR for [place]. 02/25/2022: Unfortunately, his wound has deteriorated over the past week. It is deeper and is approaching bone once again. There is more slough accumulation and the tissue shows signs of pressure injury. He says that he has been trying to wear his new work boots, which are waterproof. He says  they make his feet sweat excessively. I think our main challenge with him is that we just are not able to adequately offload him while he is still working and  on his feet through much of the day. He is willing to consider total contact casting, but will need to make some arrangements with work. On a more positive note, however, he has been approved for Epicord. 03/03/2022: The wound probed a little bit deeper again today. We are planning to put him in a total contact cast as soon as he has cleared it with work to take some time off. 03/10/2022: The wound was a little bit shallower today and the overall appearance is significantly improved. We are planning to put him in a cast today. Patient History Information obtained from Patient. Social History Never smoker, Marital Status - Married, Alcohol Use - Never, Drug Use - No History, Caffeine Use - Daily - T coffee. ea; Medical History Endocrine Patient has history of Type II Diabetes Hospitalization/Surgery History - Amuptation of 3rd,4th and 5th toes of Right foot;Oral Surgery;Anal Fissure surgery; Cholecystectomy. Objective Constitutional Hypertensive, asymptomatic. no acute distress. Vitals Time Taken: 7:45 AM, Height: 72 in, Weight: 312 lbs, BMI: 42.3, Temperature: 98.4 F, Pulse: 76 bpm, Respiratory Rate: 18 breaths/min, Blood Pressure: 165/94 mmHg, Capillary Blood Glucose: 88 mg/dl. General Notes: glucose per pt report this am Respiratory Normal work of breathing on room air. General Notes: 03/10/2022: The wound was a little bit shallower today and the overall appearance is significantly improved. Integumentary (Hair, Skin) Wound #2 status is Open. Original cause of wound was Gradually Appeared. The date acquired was: 12/19/2016. The wound has been in treatment 35 weeks. The wound is located on the [place]. The wound measures 0.4cm length x 1.1cm width x 1.2cm depth; 0.346cm^2 area and 0.415cm^3 volume. There is bone and Fat Layer (Subcutaneous Tissue) exposed. There is no tunneling noted, however, there is undermining starting at 12:00 and ending at 12:00 with a maximum  distance of 0.8cm. There is a medium amount of serosanguineous drainage noted. The wound margin is thickened. There is medium (34-66%) pink granulation within the wound bed. There is a medium (34-66%) amount of necrotic tissue within the wound bed including Adherent Slough. The periwound skin appearance had no abnormalities noted for color. The periwound skin appearance exhibited: Callus, Maceration. Periwound temperature was noted as No Abnormality. The periwound has tenderness on palpation. [address] 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 There was a Selective/Open Wound Skin/Epidermis Debridement with a total area of 0.44 sq cm performed by [name], MD. With the following instrument(s): Curette to remove Non-Viable tissue/material. Material removed includes Callus and Skin: Epidermis and after achieving pain control using Lidocaine 4% Topical Solution. No specimens were taken. A time out was conducted at 08:00, prior to the start of the procedure. A Minimum amount of bleeding was controlled with Pressure. The procedure was tolerated well with a pain level of 0 throughout and a pain level of 0 following the procedure. Post Debridement Measurements: 0.5cm length x 1.1cm width x 1.2cm depth; 0.518cm^3 volume. Character of Wound/Ulcer Post Debridement is improved.  Severity of Tissue Post Debridement is: Fat layer exposed. Post procedure Diagnosis Wound #2: Same as Pre-Procedure General Notes: scribed by [name], RN for [name]. Pre-procedure diagnosis of Wound #2 is a Diabetic Wound/Ulcer of the Lower Extremity located on the Right,Lateral,Plantar Foot. A skin graft procedure using a bioengineered skin substitute/cellular or tissue based product was performed by [name], MD with the following instrument(s): Forceps and Scissors. Epicord was applied and secured with Steri-Strips. 6 sq cm of product was utilized and 0 sq cm was wasted. Post Application, adaptic, gauze was applied. A Time Out was conducted at 08:05, prior to the start of the procedure. The procedure was tolerated well with a pain level of 0 throughout and a pain level of 0 following the procedure. Post procedure Diagnosis Wound #2: Same as Pre-Procedure . Pre-procedure diagnosis of Wound #2 is a Diabetic Wound/Ulcer of the Lower Extremity located on the Right,Lateral,Plantar Foot . There was a T Contact otal Cast Procedure by [name], MD. Post procedure Diagnosis Wound #2: Same as Pre-Procedure Plan Follow-up Appointments: Return Appointment in 1 week. - Dr Celine Ahr RM 2 Monday 2/26 @ 1000 am [facility] Return Appointment in: - Wed 2/21 @ 1000 am RM 2 for initial cast change Anesthetic: Wound #2 Right,Lateral,Plantar Foot: (In clinic) Topical Lidocaine 4% applied to wound bed Cellular or Tissue Based Products: Cellular or Tissue Based Product Type: - Epicord #3 Cellular or Tissue Based Product applied to wound bed, secured with steri-strips, cover with Adaptic or Mepitel. (DO NOT REMOVE). Bathing/ Shower/ Hygiene: May shower with protection but do not get wound dressing(s) wet. Protect dressing(s) with water repellant cover (for example, large plastic bag) or a cast cover and may then take shower. Off-Loading: T Contact Cast to Right Lower Extremity otal Other: - Try to not stand on your feet too much, minimize walking WOUND #2: - Foot Wound Laterality: Plantar, Right, Lateral Cleanser: Soap and Water 1 x Per  Week/30 Days Discharge Instructions: May shower and wash wound with dial antibacterial soap and water prior to dressing change. Cleanser: Wound Cleanser (Generic) 1 x Per Week/30 Days Discharge Instructions: Cleanse the wound with wound cleanser prior to applying a clean dressing using gauze sponges, not tissue or cotton balls. Peri-Wound Care: cotton tipped applicators (Generic) 1 x Per Week/30 Days Prim Dressing: Promogran Prisma Matrix, 4.34 (sq in) (silver collagen) 1 x Per Week/30 Days ary Discharge Instructions: Moisten collagen with saline or hydrogel Prim Dressing: Epicord 1 x Per Week/30 Days ary Secondary Dressing: ADAPTIC TOUCH 3x4.25 in 1 x Per Week/30 Days Discharge Instructions: Apply over primary dressing as directed. Secondary Dressing: Optifoam Non-Adhesive Dressing, 4x4 in 1 x Per Week/30 Days Discharge Instructions: Apply over primary dressing as directed. Secondary Dressing: Woven Gauze Sponges 2x2 in 1 x Per Week/30 Days Discharge Instructions: moistened with saline. Apply over primary dressing as directed. Secured With: Paper T ape, 2x10 (in/yd) (Generic) 1 x Per Week/30 Days Discharge Instructions: Secure dressing with tape as directed. Com pression Wrap: Kerlix Roll 4.5x3.1 (in/yd) (Generic) 1 x Per Week/30 Days Discharge Instructions: Apply Kerlix and Coban compression as directed. 03/10/2022: The wound was a little bit shallower today and the overall appearance is significantly improved. I used a curette to pare back a little bit of thick callused skin around the wound. I then moistened the Epicort and packed it into the wound. T fill the remaining o space, Prisma silver collagen was packed behind the Epicort. A gauze sponge was used as a  bolster. Everything was secured in place with Adaptic and Steri- Strips. A total contact cast was then applied. He will return on Wednesday for his first total contact cast change and have a regular wound care visit 1  week later. [name] ([id number]) [id number] Page 10 of 11 Electronic Signature(s) Signed: 03/10/2022 8:19:02 AM By: [name] MD FACS Entered By: [name] on 03/10/2022 08:19:02 -------------------------------------------------------------------------------- HxROS Details Patient Name: Date of Service: [name], [name] [name] 03/10/2022 7:30 [name] Medical Record Number: [id number] Patient Account Number: [id number] Date of Birth/Sex: Treating RN: Feb 20, 1970 (52 y.o. [name] Primary Care Provider: [name] Other Clinician: Referring Provider: Treating Provider/Extender: [name] in Treatment: 38 Information Obtained From Patient Endocrine Medical History: Positive for: Type II Diabetes Immunizations Pneumococcal Vaccine: Received Pneumococcal Vaccination: Yes Received Pneumococcal Vaccination On or After 60th Birthday: No Implantable Devices None Hospitalization / Surgery History Type of Hospitalization/Surgery Amuptation of 3rd,4th and 5th toes of Right foot;Oral Surgery;Anal Fissure surgery; Cholecystectomy Family and Social History Never smoker; Marital Status - Married; Alcohol Use: Never; Drug Use: No History; Caffeine Use: Daily - T coffee; Financial Concerns: No; Food, ea; Clothing or Shelter Needs: No; Support System Lacking: No; Transportation Concerns: No Electronic Signature(s) Signed: 03/10/2022 11:21:45 AM By: [name] MD FACS Entered By: [name] on 03/10/2022 08:15:20 -------------------------------------------------------------------------------- Total Contact Cast Details Patient Name: Date of Service: [name], [name] [name] 03/10/2022 7:30 [name] Medical Record Number: [id number] Patient Account Number: [id number] Date of Birth/Sex: Treating RN: Mar 04, 1970 (52 y.o. [name] Primary Care Provider: [name] Other Clinician: Referring Provider: Treating  Provider/Extender: [name] in Treatment: 73 T Contact Cast Applied for Wound Assessment: otal Wound #2 Right,Lateral,Plantar Foot Performed By: Physician [name], MD Post Procedure Diagnosis Same as Pre-procedure [name] ([id number]) [id number] Page 11 of 11 Electronic Signature(s) Signed: 03/10/2022 11:21:45 AM By: [name] MD FACS Signed: 03/10/2022 5:38:46 PM By: [name] RN, BSN Entered By: [name] on 03/10/2022 08:06:06 -------------------------------------------------------------------------------- SuperBill Details Patient Name: Date of Service: [name], [name] [name] 03/10/2022 Medical Record Number: [id number] Patient Account Number: [id number] Date of Birth/Sex: Treating RN: [birth year] (52 y.o. [name] Primary Care Provider: [name] Other Clinician: Referring Provider: Treating Provider/Extender: [name] in Treatment: 35 Diagnosis Coding ICD-10 Codes Code Description [phone] Non-pressure chronic ulcer of other part of right foot with necrosis of bone E11.49 Type 2 diabetes mellitus with other diabetic neurological complication [id number] Other chronic osteomyelitis, right ankle and foot Facility Procedures : CPT4 Code: [id number] Description: [id number] Epicord 2cm x 3cm - per sqcm Modifier: Quantity: 6 : CPT4 Code: [id number] Description: [id number] - SKIN SUB GRAFT FACE/NK/HF/G ICD-10 Diagnosis Description L97.514 Non-pressure chronic ulcer of other part of right foot with necrosis of bone Modifier: Quantity: 1 : CPT4 Code: [id number] Description: [id number] - APPLY TOTAL CONTACT LEG CAST ICD-10 Diagnosis Description L97.514 Non-pressure chronic ulcer of other part of right foot with necrosis of bone Modifier: Quantity: 1 Physician Procedures : CPT4 Code Description Modifier [id number] - WC PHYS LEVEL 4 - EST PT 25 ICD-10 Diagnosis Description L97.514  Non-pressure chronic ulcer of other part of right foot with necrosis of bone E11.49 Type 2 diabetes mellitus with other diabetic  neurological complication 123456 Other chronic osteomyelitis, right ankle and foot Quantity: 1 : [id number] - WC PHYS SKIN SUB GRAFT FACE/NK/HF/G ICD-10 Diagnosis Description L97.514 Non-pressure chronic ulcer of other part of right foot with necrosis of  bone Quantity: 1 : [id number] - WC PHYS APPLY TOTAL CONTACT CAST ICD-10 Diagnosis Description L97.514 Non-pressure chronic ulcer of other part of right foot with necrosis of bone Quantity: 1 Electronic Signature(s) Signed: 03/10/2022 8:19:31 AM By: [name] MD FACS Entered By: [name] on 03/10/2022 08:19:31

## 2022-03-12 ENCOUNTER — Encounter (HOSPITAL_BASED_OUTPATIENT_CLINIC_OR_DEPARTMENT_OTHER): Payer: BLUE CROSS/BLUE SHIELD | Admitting: General Surgery

## 2022-03-12 DIAGNOSIS — E11621 Type 2 diabetes mellitus with foot ulcer: Secondary | ICD-10-CM | POA: Diagnosis not present

## 2022-03-12 NOTE — Progress Notes (Addendum)
[name], [name] [place] ([id number]) [id number] Page 1 of 9 Visit Report for 03/12/2022 Chief Complaint Document Details Patient Name: Date of Service: [name], [place] [name] 03/12/2022 10:00 [name] Medical Record Number: [id number] Patient Account Number: [id number] Date of Birth/Sex: Treating RN: [birth year] (52 y.o. [name] Primary Care Provider: [name] Other Clinician: Referring Provider: Treating Provider/Extender: [name] in Treatment: 32 Information Obtained from: Patient Chief Complaint Patients presents for treatment of an open diabetic ulcer Electronic Signature(s) Signed: 03/12/2022 9:58:32 AM By: [name] MD FACS Entered By: [name] on 03/12/2022 09:58:32 -------------------------------------------------------------------------------- HPI Details Patient Name: Date of Service: [name], [name] 03/12/2022 10:00 [name] Medical Record Number: [id number] Patient Account Number: [id number] Date of Birth/Sex: Treating RN: [birth year] (52 y.o. 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 The most recent amputation was in August 2022. At that operation, antibiotic beads were placed in the wound. He has been managed by podiatry for his procedures and management of his wounds. He has been in a Water engineer. He is on oral doxycycline. They have been using Betadine wet to dry dressings along with Iodosorb. The patient states that when he thinks the wound is getting too dry, he applies topical Neosporin. At his last visit, on June 7 of this year, the podiatrist  determined that he felt the wound was stalled and referred him to wound care for additional evaluation and management. An MRI has been ordered, but not yet scheduled or performed. Pathology from his operation in August 2022 demonstrated findings consistent with chronic osteomyelitis. Today, there is a large irregular wound on the plantar surface of his right foot, at about the level of the fifth metatarsal head. This tracks through to a pinpoint opening on the dorsal lateral portion of his foot. The intake nurse reported purulent drainage. There is some malodor from the wound. No frank necrosis identified. 07/11/2021: Today, the wounds do not connect. I attempted multiple times from various directions and the shared tunnel is no longer open. He has some slough accumulation on the dorsal part of his foot as well as slough and callus buildup on the plantar surface. His MRI is scheduled for June 29. No purulent drainage or malodor appreciated today. 07/19/2021: The lateral foot wound has closed and there is no tunnel connecting the plantar foot wound to that site. The plantar foot wound still probes quite deeply, however. There is some slough, eschar, and nonviable tissue accumulated in the wound bed. No malodorous drainage present. His MRI was performed yesterday and is consistent with fairly extensive osteomyelitis. 07/29/2021: The patient has an appointment with infectious disease on July 18 to treat his osteomyelitis. The plantar wound still probes quite deeply, approaching bone. The orifice has narrowed quite substantially, however, making it more difficult for him to pack. 08/05/2021: The tunnel connecting the lateral foot wound to the plantar foot wound has reopened. He sees infectious disease tomorrow to discuss long-term antibiotic treatment for osteomyelitis. There was a bit of murky drainage in the wound, but this was noted after he had had topical lidocaine applied so may have just been a blob  of the anesthetic. No odor or frank purulent drainage.  The wound probes deeply at the midfoot approaching bone. He does have MRI results confirming his diagnosis of osteomyelitis. He has failed to progress with conventional treatment and I think his best chance for preservation of the foot is to initiate hyperbaric oxygen therapy. 08/13/2021: The tunnel connecting the 2 wounds has closed again. He has a PICC line and is getting IV daptomycin and cefepime. EKG and chest x-ray are within normal limits. He is scheduled to start hyperbaric oxygen therapy tomorrow. The wound in his midfoot probes deeply, approaching bone. The skin at the orifice continues to heaped up and threatens to close over despite the large cavity within. No erythema, induration, or purulent drainage. The wound on his lateral foot is fairly small and quite clean. 08/19/2021: The lateral foot wound has nearly closed. The wound in his midfoot does not probe quite as deeply today. The skin at the orifice continues to try to roll in and obscure the opening. No frankly necrotic tissue appreciated. He is tolerating hyperbaric oxygen therapy. 08/26/2021: The lateral foot wound has closed completely. The wound in his midfoot is shallower again today. The wound orifice is contracting. We are using gentamicin and silver alginate. He continues on IV daptomycin and cefepime and is tolerating hyperbaric oxygen therapy without difficulty. [name], [name] ([id number]) [id number] Page 2 of 9 09/02/2021: His foot is a little bit sore today but he was up walking on it all weekend doing back to school shopping with his daughter. The tunneling is down to about 3 cm and I do not appreciate bone at the tip of the probe. The wound is clean but has some callus creating an overhanging lip at the distal aspect. He continues to receive hyperbaric oxygen therapy as well as IV daptomycin and cefepime. 09/09/2021: His wound continues to  improve. The tunnel has come in by 0.9 cm. He continues to form callus around the orifice of the wound. He is tolerating hyperbaric oxygen therapy along with his IV antibiotics. 09/16/2021: The tunneling is down to just half a centimeter. He continues to build up callus around the orifice of the wound. He completed his course of IV antibiotics and his PICC line is scheduled to be removed this Friday. He is tolerating hyperbaric oxygen without difficulty. We have been applying topical gentamicin and silver alginate to his wound. 09/24/2021: The wound depth has come in even further. There is a little bit of callus buildup around the orifice. The opening is quite narrow, at this point. 09/30/2021: The wound depth is about the same this week. The opening continues to contract with callus accumulation. There is some discoloration of the skin on the lateral part of his foot and he admitted to walking more than usual this weekend and also wearing a different pair shoes. He continues to tolerate hyperbaric oxygen therapy without difficulty. 10/07/2021: The wound depth has contracted a bit and is now approximately 1 to 1.5 cm. The orifice of the wound continues to try and close in over the space. There is just some callus and skin around the margins obscuring the orifice. 10/14/2021: The wound depth has come in a little bit more. The orifice of the wound is rolling inwards with epithelium and callus, making it difficult to pack the wound. 10/21/2021: There is still 1 portion of the wound, at the lateral aspect, that is still a couple of centimeters deep. The architecture of the wound makes this somewhat challenging to access and pack. Everything else looks clean. He has his  usual accumulation of callus and skin, narrowing the orifice. 10/29/2021: No significant change to the depth of the wound. The orifice continues to contract with callus and skin narrowing the opening. His wife has been packing the wound and doing  an excellent job. 11/05/2021: The depth of the wound has come in by about a centimeter. There is less callus accumulation around the orificedespite this, the wound is narrower today. We are still awaiting insurance approval for Kerecis powder. 11/12/2021: The depth of the wound continues to contract. He has accumulated some callus around the orifice, as usual. We have received a verbal confirmation that he is approved for Kerecis, but no formal paperwork has yet been received. 11/19/2021: The depth continues to come in. There is callus accumulation around the orifice, as usual. He has been formally approved for Kerecis, but unfortunately we do not have a billing mechanism for the powder in [employer] yet. We are working to address this so we can begin treatment. 11/25/2021: Continued filling in of the depth of the wound. Continued buildup of callus around the orifice. No concern for infection. As we do not have a way to bill for Kerecis powder, but are capable of billing for the [facility] sheet, we are going to use that on him instead. 12/03/2021: The depth of the wound continues to fill in. As usual, he has accumulated callus and skin around the orifice of the wound. No concern for infection. He will complete his hyperbaric oxygen therapy this week. 12/17/2021: The wound depth continues to contract. There is callus and skin accumulation around the orifice of the wound, as per usual. There is more epithelium on the actual wound surface. Unfortunately, his Leroy Kennedy was not ordered. 12/24/2021: The wound depth has come in a little bit more this week. There is less skin accumulation around the orifice, but still some callus. On questioning, he admits to spending a fair amount of time on his feet. 12/12; this is a very difficult wound to assess size slitlike wound with some depth but minimal opening. I applied Kerecis No. 4 12/18; Kerecis No. 5 01/15/2022: His wound measured deeper today. There is also some  evidence of pressure induced tissue injury around the lateral aspect of his foot. The patient reports that he was wearing different shoes than usual and felt like his foot was rolling a lot over the weekend. There is heavy callus around the orifice of the wound. No concern for infection. 01/21/2022: The depth decreased today from 1.7 to 1.0 cm. Significantly less tissue damage as compared to last week. Still with callus and skin accumulation around the orifice. 01/29/2022: The wound continues to contract and the depth is coming in further. We are still working on getting [employer] for him. 02/04/2022: No real change this week. Still working on the [employer]. 02/11/2022: No significant changes again. We are still working on the [employer]. 02/18/2022: No change to the wound. We have submitted in IVR for [place]. 02/25/2022: Unfortunately, his wound has deteriorated over the past week. It is deeper and is approaching bone once again. There is more slough accumulation and the tissue shows signs of pressure injury. He says that he has been trying to wear his new work boots, which are waterproof. He says they make his feet sweat excessively. I think our main challenge with him is that we just are not able to adequately offload him while he is still working and on his feet through much of the day. He is willing  to consider total contact casting, but will need to make some arrangements with work. On a more positive note, however, he has been approved for Epicord. 03/03/2022: The wound probed a little bit deeper again today. We are planning to put him in a total contact cast as soon as he has cleared it with work to take some time off. 03/10/2022: The wound was a little bit shallower today and the overall appearance is significantly improved. We are planning to put him in a cast today. 03/12/2022: He is here for his first total contact cast change. He denies any issues with pressure, rubbing, or  pain. Electronic Signature(s) Signed: 03/12/2022 9:58:53 AM By: [name] MD FACS Entered By: [name] on 03/12/2022 09:58:53 [name], [name] ([id number] Page 3 of 9 -------------------------------------------------------------------------------- Physical Exam Details Patient Name: Date of Service: [name], [name] 03/12/2022 10:00 [name] Patient Account Number: [id number] Date of Birth/Sex: Treating RN: 11-Aug-1970 (52 y.o. [name] Primary Care Provider: [name] Other Clinician: Referring Provider: Treating Provider/Extender: [name] in Treatment: 61 Constitutional Hypertensive, asymptomatic. . . . no acute distress. Respiratory Normal work of breathing on room air. Notes 03/12/2022: His wound dressing was left intact. No signs of trauma to his foot or leg from the cast. Electronic Signature(s) Signed: 03/12/2022 10:05:02 AM By: [name] MD FACS Previous Signature: 03/12/2022 9:59:39 AM Version By: [name] MD FACS Entered By: [name] on 03/12/2022 10:05:01 -------------------------------------------------------------------------------- Physician Orders Details Patient Name: Date of Service: [name], [name] 03/12/2022 10:00 [name] Patient Account Number: [id number] Date of Birth/Sex: Treating RN: 06-28-1970 (52 y.o. 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 - Dr Celine Ahr RM 2 Return A Monday 2/26 @ 1000 am TCC ppointment in: - Wed 2/21 @ 1000 am RM 2 for initial cast change Return A Anesthetic Wound #2 Right,Lateral,Plantar Foot (In clinic) Topical Lidocaine 4% applied to wound bed Cellular or Tissue Based Products Cellular or Tissue Based Product Type: - Epicord #3 Cellular or Tissue Based Product applied to wound bed, secured with steri-strips, cover with Adaptic or Mepitel. (DO NOT REMOVE). Bathing/ Shower/ Hygiene May shower with protection but do not get wound dressing(s) wet. Protect dressing(s) with water repellant cover (for example, large plastic bag) or a cast cover and may then take shower. Off-Loading Total Contact Cast to Right Lower Extremity Other: - Try to not stand on your feet too much, minimize walking Wound Treatment Wound #2 - Foot Wound Laterality: Plantar, Right, Lateral Cleanser: Soap and Water 1 x Per Week/30 Days Discharge Instructions: May shower and wash wound with dial antibacterial soap and water prior to dressing change. Cleanser: Wound Cleanser (Generic) 1 x Per Week/30 Days Discharge Instructions: Cleanse the wound with wound cleanser prior to applying a clean dressing using gauze sponges, not tissue or cotton balls. Peri-Wound Care: cotton tipped applicators (Generic) 1 x Per Week/30 Days [name], [name] ([id number]) [id number] Page 4 of 9 Prim Dressing: Promogran Prisma Matrix, 4.34 (sq in) (silver collagen) 1 x Per Week/30 Days ary Discharge Instructions: Moisten  collagen with saline or hydrogel Prim Dressing: Epicord ary 1 x Per Week/30 Days Secondary Dressing: ADAPTIC TOUCH 3x4.25 in 1 x Per Week/30 Days Discharge Instructions: Apply over primary dressing as directed. Secondary Dressing: Optifoam Non-Adhesive Dressing, 4x4 in 1 x Per Week/30 Days Discharge Instructions: Apply over primary dressing as directed. Secondary Dressing: Woven  Gauze Sponges 2x2 in 1 x Per Week/30 Days Discharge Instructions: moistened with saline. Apply over primary dressing as directed. Secured With: Paper Tape, 2x10 (in/yd) (Generic) 1 x Per Week/30 Days Discharge Instructions: Secure dressing with tape as directed. Compression Wrap: Kerlix Roll 4.5x3.1 (in/yd) (Generic) 1 x Per Week/30 Days Discharge Instructions: Apply Kerlix and Coban compression as directed. Electronic Signature(s) Signed: 03/12/2022 10:33:46 AM By: [name] MD FACS Signed: 03/12/2022 4:21:22 PM By: Adline Peals Entered By: Adline Peals on 03/12/2022 10:08:55 -------------------------------------------------------------------------------- Problem List Details Patient Name: Date of Service: [name], [name] 03/12/2022 10:00 [name] Patient Account Number: [id number] Date of Birth/Sex: Treating RN: 1970-08-23 (52 y.o. 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 03/12/2022 10:00 [name] Patient Account Number: [id number] Date of Birth/Sex: Treating RN: November 30, 1970 (52 y.o. 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 The most recent amputation was in August 2022. At that operation, antibiotic beads were placed in the wound. He has been managed by podiatry for his procedures and management of his wounds. He has been in a Water engineer. He is on oral doxycycline. They have been using Betadine wet to dry dressings along with Iodosorb. The patient states that when he thinks the wound is getting too dry, he applies topical Neosporin. At his last visit, on June 7 of this year, the podiatrist determined that he felt the wound was stalled and referred him to wound care for additional evaluation and management. An MRI has been ordered, but not yet scheduled or performed. Pathology from his operation in August 2022 demonstrated findings consistent with chronic osteomyelitis. Today, there is a large irregular wound on the plantar surface of his right foot, at about the level of the fifth metatarsal head. This tracks through to a pinpoint opening on the dorsal lateral portion of his foot. The intake nurse reported purulent drainage. There is some malodor from the wound. No frank necrosis identified. 07/11/2021: Today, the wounds do not connect. I attempted multiple times from various directions and the shared tunnel is no longer open. He has some slough accumulation  on the dorsal part of his foot as well as slough and callus buildup on the plantar surface.  His MRI is scheduled for June 29. No purulent drainage or malodor appreciated today. 07/19/2021: The lateral foot wound has closed and there is no tunnel connecting the plantar foot wound to that site. The plantar foot wound still probes quite deeply, however. There is some slough, eschar, and nonviable tissue accumulated in the wound bed. No malodorous drainage present. His MRI was performed yesterday and is consistent with fairly extensive osteomyelitis. 07/29/2021: The patient has an appointment with infectious disease on July 18 to treat his osteomyelitis. The plantar wound still probes quite deeply, approaching bone. The orifice has narrowed quite substantially, however, making it more difficult for him to pack. 08/05/2021: The tunnel connecting the lateral foot wound to the plantar foot wound has reopened. He sees infectious disease tomorrow to discuss long-term antibiotic treatment for osteomyelitis. There was a bit of murky drainage in the wound, but this was noted after he had had topical lidocaine applied so may have just been a blob of the anesthetic. No odor or frank purulent drainage. The wound probes deeply at the midfoot approaching bone. He does have MRI results confirming his diagnosis of osteomyelitis. He has failed to progress with conventional treatment and I think his best chance for preservation of the foot is to initiate hyperbaric oxygen therapy. 08/13/2021: The tunnel connecting the 2 wounds has closed again. He has a PICC line and is getting IV daptomycin and cefepime. EKG and chest x-ray are within normal limits. He is scheduled to start hyperbaric oxygen therapy tomorrow. The wound in his midfoot probes deeply, approaching bone. The skin at the orifice continues to heaped up and threatens to close over despite the large cavity within. No erythema, induration, or purulent drainage. The wound on his lateral foot is fairly small and quite clean. 08/19/2021: The lateral foot wound  has nearly closed. The wound in his midfoot does not probe quite as deeply today. The skin at the orifice continues to try to roll in and obscure the opening. No frankly necrotic tissue appreciated. He is tolerating hyperbaric oxygen therapy. 08/26/2021: The lateral foot wound has closed completely. The wound in his midfoot is shallower again today. The wound orifice is contracting. We are using gentamicin and silver alginate. He continues on IV daptomycin and cefepime and is tolerating hyperbaric oxygen therapy without difficulty. 09/02/2021: His foot is a little bit sore today but he was up walking on it all weekend doing back to school shopping with his daughter. The tunneling is down to about 3 cm and I do not appreciate bone at the tip of the probe. The wound is clean but has some callus creating an overhanging lip at the distal aspect. He continues to receive hyperbaric oxygen therapy as well as IV daptomycin and cefepime. 09/09/2021: His wound continues to improve. The tunnel has come in by 0.9 cm. He continues to form callus around the orifice of the wound. He is tolerating hyperbaric oxygen therapy along with his IV antibiotics. 09/16/2021: The tunneling is down to just half a centimeter. He continues to build up callus around the orifice of the wound. He completed his course of IV antibiotics and his PICC line is scheduled to be removed this Friday. He is tolerating hyperbaric oxygen without difficulty. We have been applying topical gentamicin and silver alginate to his wound. 09/24/2021: The wound depth has come in even further. There is a little bit  of callus buildup around the orifice. The opening is quite narrow, at this point. 09/30/2021: The wound depth is about the same this week. The opening continues to contract with callus accumulation. There is some discoloration of the skin on the lateral part of his foot and he admitted to walking more than usual this weekend and also wearing a different  pair shoes. He continues to tolerate hyperbaric oxygen therapy without difficulty. 10/07/2021: The wound depth has contracted a bit and is now approximately 1 to 1.5 cm. The orifice of the wound continues to try and close in over the space. There is just some callus and skin around the margins obscuring the orifice. 10/14/2021: The wound depth has come in a little bit more. The orifice of the wound is rolling inwards with epithelium and callus, making it difficult to pack the wound. 10/21/2021: There is still 1 portion of the wound, at the lateral aspect, that is still a couple of centimeters deep. The architecture of the wound makes this somewhat challenging to access and pack. Everything else looks clean. He has his usual accumulation of callus and skin, narrowing the orifice. 10/29/2021: No significant change to the depth of the wound. The orifice continues to contract with callus and skin narrowing the opening. His wife has been packing the wound and doing an excellent job. 11/05/2021: The depth of the wound has come in by about a centimeter. There is less callus accumulation around the orificeoodespite this, the wound is narrower today. We are still awaiting insurance approval for Kerecis powder. 11/12/2021: The depth of the wound continues to contract. He has accumulated some callus around the orifice, as usual. We have received a verbal confirmation that he is approved for Kerecis, but no formal paperwork has yet been received. 11/19/2021: The depth continues to come in. There is callus accumulation around the orifice, as usual. He has been formally approved for Kerecis, but [name], [name] ([id number]) [id number] Page 6 of 9 unfortunately we do not have a billing mechanism for the powder in [facility] yet. We are working to address this so we can begin treatment. 11/25/2021: Continued filling in of the depth of the wound. Continued buildup of callus around the orifice. No  concern for infection. As we do not have a way to bill for Kerecis powder, but are capable of billing for the [facility] sheet, we are going to use that on him instead. 12/03/2021: The depth of the wound continues to fill in. As usual, he has accumulated callus and skin around the orifice of the wound. No concern for infection. He will complete his hyperbaric oxygen therapy this week. 12/17/2021: The wound depth continues to contract. There is callus and skin accumulation around the orifice of the wound, as per usual. There is more epithelium on the actual wound surface. Unfortunately, his Leroy Kennedy was not ordered. 12/24/2021: The wound depth has come in a little bit more this week. There is less skin accumulation around the orifice, but still some callus. On questioning, he admits to spending a fair amount of time on his feet. 12/12; this is a very difficult wound to assess size slitlike wound with some depth but minimal opening. I applied Kerecis No. 4 12/18; Kerecis No. 5 01/15/2022: His wound measured deeper today. There is also some evidence of pressure induced tissue injury around the lateral aspect of his foot. The patient reports that he was wearing different shoes than usual and felt like his foot was rolling a lot over the  weekend. There is heavy callus around the orifice of the wound. No concern for infection. 01/21/2022: The depth decreased today from 1.7 to 1.0 cm. Significantly less tissue damage as compared to last week. Still with callus and skin accumulation around the orifice. 01/29/2022: The wound continues to contract and the depth is coming in further. We are still working on getting [employer] for him. 02/04/2022: No real change this week. Still working on the [employer]. 02/11/2022: No significant changes again. We are still working on the [employer]. 02/18/2022: No change to the wound. We have submitted in IVR for [place]. 02/25/2022: Unfortunately, his wound has  deteriorated over the past week. It is deeper and is approaching bone once again. There is more slough accumulation and the tissue shows signs of pressure injury. He says that he has been trying to wear his new work boots, which are waterproof. He says they make his feet sweat excessively. I think our main challenge with him is that we just are not able to adequately offload him while he is still working and on his feet through much of the day. He is willing to consider total contact casting, but will need to make some arrangements with work. On a more positive note, however, he has been approved for Epicord. 03/03/2022: The wound probed a little bit deeper again today. We are planning to put him in a total contact cast as soon as he has cleared it with work to take some time off. 03/10/2022: The wound was a little bit shallower today and the overall appearance is significantly improved. We are planning to put him in a cast today. 03/12/2022: He is here for his first total contact cast change. He denies any issues with pressure, rubbing, or pain. Patient History Information obtained from Patient. Social History Never smoker, Marital Status - Married, Alcohol Use - Never, Drug Use - No History, Caffeine Use - Daily - T coffee. ea; Medical History Endocrine Patient has history of Type II Diabetes Hospitalization/Surgery History - Amuptation of 3rd,4th and 5th toes of Right foot;Oral Surgery;Anal Fissure surgery; Cholecystectomy. Objective Constitutional Hypertensive, asymptomatic. no acute distress. Vitals Time Taken: 9:45 AM, Height: 72 in, Weight: 312 lbs, BMI: 42.3, Temperature: 98.1 F, Pulse: 84 bpm, Respiratory Rate: 20 breaths/min, Blood Pressure: 155/80 mmHg, Capillary Blood Glucose: 127 mg/dl. Respiratory Normal work of breathing on room air. General Notes: 03/12/2022: His wound dressing was left intact. No signs of trauma to his foot or leg from the cast. Integumentary (Hair,  Skin) Wound #2 status is Open. Original cause of wound was Gradually Appeared. The date acquired was: 12/19/2016. The wound has been in treatment 35 weeks. The wound is located on the [place]. The wound measures 0.4cm length x 1.1cm width x 1.2cm depth; 0.346cm^2 area and 0.415cm^3 volume. There is bone and Fat Layer (Subcutaneous Tissue) exposed. There is a medium amount of serosanguineous drainage noted. The wound margin is thickened. There is medium (34-66%) pink granulation within the wound bed. There is a medium (34-66%) amount of necrotic tissue within the wound bed including Adherent Slough. The periwound skin appearance had no abnormalities noted for color. The periwound skin appearance exhibited: Callus, Maceration. Periwound temperature was noted as No Abnormality. The periwound has tenderness on palpation. 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 There was a T Contact otal Cast Procedure by [name], MD. Post procedure Diagnosis Wound #2: Same as Pre-Procedure Notes: scribed for [name] by Adline Peals, RN. Plan Follow-up Appointments: Return Appointment in 1 week. - Dr Celine Ahr RM 2 Monday 2/26 @ 1000 am [facility] Return Appointment in: - Wed 2/21 @ 1000 am RM 2 for initial cast change Anesthetic: Wound #2 Right,Lateral,Plantar Foot: (In clinic) Topical Lidocaine 4% applied to wound bed Cellular or Tissue Based Products: Cellular or Tissue Based Product Type: - Epicord #3 Cellular or Tissue Based Product applied to wound bed, secured  with steri-strips, cover with Adaptic or Mepitel. (DO NOT REMOVE). Bathing/ Shower/ Hygiene: May shower with protection but do not get wound dressing(s) wet. Protect dressing(s) with water repellant cover (for example, large plastic bag) or a cast cover and may then take shower. Off-Loading: T Contact Cast to Right Lower Extremity otal Other: - Try to not stand on your feet too much, minimize walking WOUND #2: - Foot Wound Laterality: Plantar, Right, Lateral Cleanser: Soap and Water 1 x Per Week/30 Days Discharge Instructions: May shower and wash wound with dial antibacterial soap and water prior to dressing change. Cleanser: Wound Cleanser (Generic) 1 x Per Week/30 Days Discharge Instructions: Cleanse the wound with wound cleanser prior to applying a clean dressing using gauze sponges, not tissue or cotton balls. Peri-Wound Care: cotton tipped applicators (Generic) 1 x Per Week/30 Days Prim Dressing: Promogran Prisma Matrix, 4.34 (sq in) (silver collagen) 1 x Per Week/30 Days ary Discharge Instructions: Moisten collagen with saline or hydrogel Prim Dressing: Epicord 1 x Per Week/30 Days ary Secondary Dressing: ADAPTIC TOUCH 3x4.25 in 1 x Per Week/30 Days Discharge Instructions: Apply over primary dressing as directed. Secondary Dressing: Optifoam Non-Adhesive Dressing, 4x4 in 1 x Per Week/30 Days Discharge Instructions: Apply over primary dressing as directed. Secondary Dressing: Woven Gauze Sponges 2x2 in 1 x Per Week/30 Days Discharge Instructions: moistened with saline. Apply over primary dressing as directed. Secured With: Paper T ape, 2x10 (in/yd) (Generic) 1 x Per Week/30 Days Discharge Instructions: Secure dressing with tape as directed. Com pression Wrap: Kerlix Roll 4.5x3.1 (in/yd) (Generic) 1 x Per Week/30 Days Discharge Instructions: Apply Kerlix and Coban compression as directed. 03/12/2022: He is here for his first total contact cast change. No issues since being in the  cast. The total contact cast was reapplied without complications or difficulty. Follow-up in 1 week for wound care and repeat total contact cast placement Electronic Signature(s) Signed: 03/17/2022 4:58:37 PM By: Deon Pilling RN, BSN Signed: 03/17/2022 5:09:54 PM By: [name] MD FACS Previous Signature: 03/12/2022 10:07:24 AM Version By: [name] MD FACS Entered By: Deon Pilling on 03/17/2022 14:29:20 HxROS Details -------------------------------------------------------------------------------- [name], [name] ([id number] Page 8 of 9 Patient Name: Date of Service: [name], [name] 03/12/2022 10:00 [name] Patient Account Number: [id number] Date of Birth/Sex: Treating RN: 07-16-1970 (52 y.o. [name] Primary Care Provider: [name] Other Clinician: Referring Provider: Treating Provider/Extender: [name] in Treatment: 75 Information Obtained From Patient Endocrine Medical History: Positive for: Type II Diabetes Immunizations Pneumococcal Vaccine: Received Pneumococcal Vaccination: Yes Received Pneumococcal Vaccination On or After 60th Birthday: No Implantable Devices None Hospitalization / Surgery History Type of Hospitalization/Surgery Amuptation of 3rd,4th and 5th toes of Right foot;Oral Surgery;Anal Fissure surgery; Cholecystectomy  Family and Social History Never smoker; Marital Status - Married; Alcohol Use: Never; Drug Use: No History; Caffeine Use: Daily - T coffee; Financial Concerns: No; Food, ea; Clothing or Shelter Needs: No; Support System Lacking: No; Transportation Concerns: No Electronic Signature(s) Signed: 03/12/2022 10:33:46 AM By: [name] MD FACS Entered By: [name] on 03/12/2022 09:58:59 -------------------------------------------------------------------------------- Total Contact Cast Details Patient Name: Date of  Service: [name], [name] 03/12/2022 10:00 [name] Patient Account Number: [id number] Date of Birth/Sex: Treating RN: 1970/09/27 (52 y.o. [name] Primary Care Provider: [name] Other Clinician: Referring Provider: Treating Provider/Extender: [name] in Treatment: 31 T Contact Cast Applied for Wound Assessment: otal Wound #2 Right,Lateral,Plantar Foot Performed By: Physician [name], MD Post Procedure Diagnosis Same as Pre-procedure Notes scribed for [name] by Adline Peals, RN Electronic Signature(s) Signed: 03/12/2022 10:33:46 AM By: [name] MD FACS Signed: 03/12/2022 4:21:22 PM By: Adline Peals Entered By: Adline Peals on 03/12/2022 10:07:59 -------------------------------------------------------------------------------- SuperBill Details Patient Name: Date of Service: [name], [name] 03/12/2022 Medical Record Number: [id number] Patient Account Number: [id number] Date of Birth/Sex: Treating RN: [birth year] (52 y.o. [name] Primary Care Provider: [name] Other Clinician: Referring Provider: Treating Provider/Extender: [name] in Treatment: 35 [name], [name] ([id number]) [id number] Page 9 of 9 Diagnosis Coding ICD-10 Codes Code Description L97.514 Non-pressure chronic ulcer of other part of right foot with necrosis of bone E11.49 Type 2 diabetes mellitus with other diabetic neurological complication 123456 Other chronic osteomyelitis, right ankle and foot Facility Procedures : CPT4 Code: [id number] Description: [id number] - APPLY TOTAL CONTACT LEG CAST ICD-10 Diagnosis Description L97.514 Non-pressure chronic ulcer of other part of right foot with necrosis of bone Modifier: Quantity: 1 Physician Procedures : CPT4 Code Description Modifier [id number] - WC PHYS LEVEL 3 - EST PT ICD-10 Diagnosis  Description L97.514 Non-pressure chronic ulcer of other part of right foot with necrosis of bone E11.49 Type 2 diabetes mellitus with other diabetic neurological  complication 123456 Other chronic osteomyelitis, right ankle and foot Quantity: 1 : [id number] - WC PHYS APPLY TOTAL CONTACT CAST ICD-10 Diagnosis Description L97.514 Non-pressure chronic ulcer of other part of right foot with necrosis of bone Quantity: 1 Electronic Signature(s) Signed: 03/12/2022 10:07:46 AM By: [name] MD FACS Entered By: [name] on 03/12/2022 10:07:45

## 2022-03-13 ENCOUNTER — Other Ambulatory Visit: Payer: Self-pay | Admitting: Family Medicine

## 2022-03-13 NOTE — Progress Notes (Signed)
[name], [name] ([id number]) [id number] Page 1 of 6 Visit Report for 03/12/2022 Arrival Information Details Patient Name: Date of Service: [name], [name] 03/12/2022 10:00 [name] Patient Account Number: [id number] Date of Birth/Sex: Treating RN: 1970/04/20 (52 y.o. [name] Primary Care [name]: [name] Other Clinician: Referring [name]: Treating [name]/[name] Visit Information History Since Last Visit All ordered tests and consults were completed: No Patient Arrived: Ambulatory Added or deleted any medications: No Arrival Time: 09:44 Any new allergies or adverse reactions: No Accompanied By: self Had a fall or experienced change [name] No Transfer Assistance: None activities of daily living that may affect Patient Identification Verified: Yes risk of falls: Secondary Verification Process Completed: Yes Signs or symptoms of abuse/neglect since last visito No Patient Requires Transmission-Based Precautions: No Hospitalized since last visit: No Patient Has Alerts: Yes Implantable device outside of the clinic excluding No cellular tissue based products placed [name] the center since last visit: Pain Present Now: No Electronic Signature(s) Signed: 03/12/2022 10:38:28 AM By: [name] Entered By: [name] on 03/12/2022 09:45:01 -------------------------------------------------------------------------------- Encounter Discharge Information Details Patient Name: Date of Service: [name], [name] 03/12/2022 10:00 [name] Patient Account Number: [id number] Date of Birth/Sex: Treating RN: December 25, 1970 (52 y.o. [name] Primary Care Caily Rakers: [name] Other Clinician: Referring [name]: Treating [name]/[name]: 47 Encounter Discharge Information  Items Discharge Condition: Stable Ambulatory Status: Ambulatory Discharge Destination: Home Transportation: Private Auto Accompanied By: self Schedule Follow-up Appointment: Yes Clinical Summary of Care: Patient Declined Electronic Signature(s) Signed: 03/12/2022 4:21:22 PM By: [name] Entered By: [name] on 03/12/2022 10:09:17 [name], [name] ([id number] Page 2 of 6 -------------------------------------------------------------------------------- Lower Extremity Assessment Details Patient Name: Date of Service: [name], [name] 03/12/2022 10:00 [name] Patient Account Number: [id number] Date of Birth/Sex: Treating RN: January 26, 1970 (52 y.o. [name] Primary Care Ree Alcalde: [name] Other Clinician: Referring Braxdon Gappa: Treating [name]/[name]: 35 Edema Assessment Assessed: [name]: No] [name]: No] Edema: [Left: N] [Right: o] Calf Left: Right: Point of Measurement: From Medial Instep 38 cm Ankle Left: Right: Point of Measurement: From Medial Instep 23 cm Electronic Signature(s) Signed: 03/12/2022 4:21:22 PM By: [name] Entered By: [name] on 03/12/2022 10:06:49 -------------------------------------------------------------------------------- Multi Wound Chart Details Patient Name: Date of Service: [name], [name] 03/12/2022 10:00 [name] Patient Account Number: [id number] Date of Birth/Sex: Treating RN: 10-24-1970 (52 y.o. [name] Primary Care Kateland Leisinger: [name] Other Clinician: Referring Jamonica Schoff: Treating Dyshon Philbin/[name]: 35 Vital Signs Height([name]: 72 Capillary Blood Glucose(mg/dl): 127 Weight(lbs): 312 Pulse(bpm): 84 Body Mass Index(BMI): 42.3 Blood Pressure(mmHg): 155/80 Temperature(F):  98.1 Respiratory Rate(breaths/min): 20 [Treatment Notes:Wound Assessments Treatment Notes] Electronic Signature(s) Signed: 03/12/2022 9:58:07 AM By: [name] MD FACS Entered By: [name] on 03/12/2022 09:58:07 [name], [name] ([id number] Page 3 of 6 -------------------------------------------------------------------------------- Multi-Disciplinary Care Plan Details Patient Name: Date of Service: [name], [name] 03/12/2022 10:00 [name] Patient Account Number: [id number] Date of Birth/Sex: Treating RN: 13-Aug-1970 (52 y.o. [name] Primary Care Florette Thai: [name] Other Clinician: Referring Harmoney Sienkiewicz: Treating Felma Pfefferle/[name]: 37 Multidisciplinary Care Plan reviewed with physician Active Inactive Nutrition Nursing Diagnoses: Impaired glucose control: actual or potential Potential  for alteratiion [name] Nutrition/Potential for imbalanced nutrition Goals: Patient/caregiver will maintain therapeutic glucose control Date Initiated: 07/29/2021 Target Resolution Date: 03/18/2022 Goal Status: Active Interventions: Assess patient nutrition upon admission and as needed per policy Provide education on elevated blood sugars and impact on wound healing Treatment Activities: Patient referred to Primary Care Physician for further nutritional evaluation : 07/29/2021 Notes: Wound/Skin Impairment Nursing Diagnoses: Impaired tissue integrity Goals: Patient/caregiver will verbalize understanding of skin care regimen Date Initiated: 07/04/2021 Target Resolution Date: 04/01/2022 Goal Status: Active Interventions: Assess ulceration(s) every visit Treatment Activities: Skin care regimen initiated : 07/04/2021 Notes: Electronic Signature(s) Signed: 03/12/2022 4:21:22 PM By: [name] Entered By: [name] on 03/12/2022  10:08:12 -------------------------------------------------------------------------------- Pain Assessment Details Patient Name: Date of Service: [name], [name] 03/12/2022 10:00 [name] Patient Account Number: [id number] Date of Birth/Sex: Treating RN: [birth year] (52 y.o. [name] Primary Care Ugonna Keirsey: [name] Other Clinician: Referring Marvens Hollars: Treating Davidlee Jeanbaptiste/[name]: 35 Active Problems Location of Pain Severity and Description of Pain [name], [name] ([id number]) [id number] Page 4 of 6 Patient Has Paino No Site Locations Pain Management and Medication Current Pain Management: Electronic Signature(s) Signed: 03/12/2022 10:38:28 AM By: [name] Entered By: [name] on 03/12/2022 09:45:39 -------------------------------------------------------------------------------- Patient/Caregiver Education Details Patient Name: Date of Service: [name], [name] 2/21/2024andnbsp10:00 [name] Patient Account Number: [id number] Date of Birth/Gender: Treating RN: [birth year] (52 y.o. [name] Primary Care Physician: [name] Other Clinician: Referring Physician: Treating Physician/[name]: 35 Education Assessment Education Provided To: Patient Education Topics Provided Offloading: Methods: Explain/Verbal Responses: Reinforcements needed, State content correctly Electronic Signature(s) Signed: 03/12/2022 4:21:22 PM By: [name] Entered By: [name] on 03/12/2022 10:08:27 -------------------------------------------------------------------------------- Wound Assessment Details Patient Name: Date of Service: [name], [name] 03/12/2022 10:00 [name] Patient Account Number: [id number] Date of Birth/Sex: Treating  RN: [birth year] (52 y.o. [name] [name], [name] ([id number]) [id number] Page 5 of 6 Primary Care Ilya Neely: [name] Other Clinician: Referring Mcadoo Muzquiz: Treating Drewey Begue/[name]: 35 Wound Status Wound Number: 2 Primary Etiology: Diabetic Wound/Ulcer of the Lower Extremity Wound Location: Right, Lateral, Plantar Foot Wound Status: Open Wounding Event: Gradually Appeared Comorbid History: Type II Diabetes Date Acquired: 12/19/2016 Weeks Of Treatment: 35 Clustered Wound: No Wound Measurements Length: (cm) 0.4 Width: (cm) 1.1 Depth: (cm) 1.2 Area: (cm) 0.346 Volume: (cm) 0.415 % Reduction [name] Area: 90.4% % Reduction [name] Volume: 97.7% Epithelialization: None Wound Description Classification: Grade 3 Wound Margin: Thickened Exudate Amount: Medium Exudate Type: Serosanguineous Exudate Color: red, brown Foul Odor After Cleansing: No Slough/Fibrino No Wound Bed Granulation Amount: Medium (34-66%) Exposed Structure Granulation Quality: Pink Fascia Exposed: No Necrotic Amount: Medium (34-66%) Fat Layer (Subcutaneous Tissue) Exposed: Yes Necrotic Quality: Adherent Slough Tendon Exposed: No Muscle Exposed: No Joint Exposed: No Bone Exposed: Yes Periwound Skin Texture Texture Color No Abnormalities Noted: No No Abnormalities Noted: Yes Callus: Yes Temperature / Pain Temperature: No Abnormality Moisture No Abnormalities Noted: No Tenderness on Palpation: Yes Maceration: Yes Treatment Notes Wound #2 (Foot) Wound Laterality: Plantar, Right, Lateral Cleanser Soap and Water Discharge Instruction: May shower and wash wound with dial antibacterial soap and water prior to dressing change. Wound Cleanser Discharge Instruction: Cleanse the wound with wound cleanser prior to applying a clean dressing using gauze sponges, not tissue or cotton balls. Peri-Wound Care cotton  tipped  applicators Topical Primary Dressing Promogran Prisma Matrix, 4.34 (sq [name] (silver collagen) Discharge Instruction: Moisten collagen with saline or hydrogel Epicord Secondary Dressing ADAPTIC TOUCH 3x4.25 [name] Discharge Instruction: Apply over primary dressing as directed. Optifoam Non-Adhesive Dressing, 4x4 [name] Discharge Instruction: Apply over primary dressing as directed. Woven Gauze Sponges 2x2 [name] Discharge Instruction: moistened with saline. Apply over primary dressing as directed. Secured With [employer], 2x10 ([name]/yd) [name], [name] ([id number]) [id number] Page 6 of 6 Discharge Instruction: Secure dressing with tape as directed. Compression Wrap Kerlix Roll 4.5x3.1 ([name]/yd) Discharge Instruction: Apply Kerlix and Coban compression as directed. Compression Stockings Add-Ons Electronic Signature(s) Signed: 03/12/2022 4:21:22 PM By: [name] Entered By: [name] on 03/12/2022 10:07:31 -------------------------------------------------------------------------------- Vitals Details Patient Name: Date of Service: [name], [name] 03/12/2022 10:00 [name] Patient Account Number: [id number] Date of Birth/Sex: Treating RN: [birth year] (52 y.o. [name] Primary Care Kierstyn Baranowski: [name] Other Clinician: Referring Aracelia Brinson: Treating Janautica Netzley/[name]: 35 Vital Signs Time Taken: 09:45 Temperature (F): 98.1 Height ([name]: 72 Pulse (bpm): 84 Weight (lbs): 312 Respiratory Rate (breaths/min): 20 Body Mass Index (BMI): 42.3 Blood Pressure (mmHg): 155/80 Capillary Blood Glucose (mg/dl): 127 Reference Range: 80 - 120 mg / dl Electronic Signature(s) Signed: 03/12/2022 10:38:28 AM By: [name] Entered By: [name] on 03/12/2022 09:45:32

## 2022-03-17 ENCOUNTER — Encounter (HOSPITAL_BASED_OUTPATIENT_CLINIC_OR_DEPARTMENT_OTHER): Payer: BLUE CROSS/BLUE SHIELD | Admitting: General Surgery

## 2022-03-17 DIAGNOSIS — E11621 Type 2 diabetes mellitus with foot ulcer: Secondary | ICD-10-CM | POA: Diagnosis not present

## 2022-03-18 ENCOUNTER — Ambulatory Visit (HOSPITAL_BASED_OUTPATIENT_CLINIC_OR_DEPARTMENT_OTHER): Payer: BLUE CROSS/BLUE SHIELD | Admitting: General Surgery

## 2022-03-18 NOTE — Progress Notes (Signed)
[name], [name] ([id number]) [id number] Page 1 of 7 Visit Report for 03/17/2022 Arrival Information Details Patient Name: Date of Service: [name], [name] 03/17/2022 10:00 [name] Patient Account Number: [id number] Date of Birth/Sex: Treating RN: [birth year] (52 y.o. [name] Primary Care [name]: [name] Other Clinician: Referring Francys Bolin: Treating [name]/Extender: [name] in Treatment: 36 Visit Information History Since Last Visit Added or deleted any medications: No Patient Arrived: Ambulatory Any new allergies or adverse reactions: No Arrival Time: 09:55 Had a fall or experienced change in No Accompanied By: self activities of daily living that may affect Transfer Assistance: None risk of falls: Patient Identification Verified: Yes Signs or symptoms of abuse/neglect since last visito No Secondary Verification Process Completed: Yes Hospitalized since last visit: No Patient Requires Transmission-Based Precautions: No Implantable device outside of the clinic excluding No Patient Has Alerts: Yes cellular tissue based products placed in the center since last visit: Has Dressing in Place as Prescribed: Yes Has Compression in Place as Prescribed: Yes Pain Present Now: No Electronic Signature(s) Signed: 03/17/2022 4:48:14 PM By: Adline Peals Entered By: Adline Peals on 03/17/2022 09:55:56 -------------------------------------------------------------------------------- Encounter Discharge Information Details Patient Name: Date of Service: [name], [name] 03/17/2022 10:00 [name] Patient Account Number: [id number] Date of Birth/Sex: Treating RN: [birth year] (52 y.o. [name] Primary Care [name]: [name] Other Clinician: Referring [name]: Treating [name]/Extender: [name] in Treatment: 36 Encounter Discharge Information Items Post Procedure Vitals Discharge Condition: Stable Temperature (F): 98.2 Ambulatory Status: Ambulatory Pulse (bpm): 72 Discharge Destination: Home Respiratory Rate (breaths/min): 16 Transportation: Private Auto Blood Pressure (mmHg): 158/88 Accompanied By: self Schedule Follow-up Appointment: Yes Clinical Summary of Care: Patient Declined Electronic Signature(s) Signed: 03/17/2022 4:48:14 PM By: Adline Peals Entered By: Adline Peals on 03/17/2022 10:25:00 -------------------------------------------------------------------------------- Lower Extremity Assessment Details Patient Name: Date of Service: [name], [name] 03/17/2022 10:00 [name] Patient Account Number: [id number] Date of Birth/Sex: Treating RN: May 04, 1970 (52 y.o. [name] Primary Care [name]: [name] Other Clinician: Referring [name]: Treating [name]/Extender: [name] in Treatment: 36 Edema Assessment S[Left: NERY, DASTRUP [phone] [name] [id number].pdf Page 2 of 7] Assessed: [Left: No] [Right: No] Edema: [Left: N] [Right: o] Calf Left: Right: Point of Measurement: From Medial Instep 42 cm Ankle Left: Right: Point of Measurement: From Medial Instep 24.4 cm Vascular Assessment Pulses: Dorsalis Pedis Palpable: [Right:Yes] Electronic Signature(s) Signed: 03/17/2022 4:48:14 PM By: Adline Peals Entered By: Adline Peals on 03/17/2022 10:11:46 -------------------------------------------------------------------------------- Multi Wound Chart Details Patient Name: Date of Service: [name], [name] 03/17/2022 10:00 [name] Patient Account Number: [id number] Date of Birth/Sex: Treating RN: [birth year] (52 y.o. [name] Primary Care Renna Kilmer: [name] Other Clinician: Referring  [name]: Treating [name]/Extender: [name] in Treatment: 36 Vital Signs Height(in): 72 Pulse(bpm): 72 Weight(lbs): 312 Blood Pressure(mmHg): 158/88 Body Mass Index(BMI): 42.3 Temperature(F): 98.2 Respiratory Rate(breaths/min): 16 [2:Photos:] [N/A:N/A] Right, Lateral, Plantar Foot N/A N/A Wound Location: Gradually Appeared N/A N/A Wounding Event: Diabetic Wound/Ulcer of the Lower N/A N/A Primary Etiology: Extremity Type II Diabetes N/A N/A Comorbid History: 12/19/2016 N/A N/A Date Acquired: 16 N/A N/A Weeks of Treatment: Open N/A N/A Wound Status: No N/A N/A Wound Recurrence: 0.2x0.6x2 N/A N/A Measurements L x W x D (cm) 0.094 N/A N/A A (cm) : rea 0.188 N/A N/A Volume (cm) :  97.40% N/A N/A % Reduction in A rea: 99.00% N/A N/A % Reduction in Volume: Grade 3 N/A N/A Classification: Medium N/A N/A Exudate A mount: Serosanguineous N/A N/A Exudate Type: red, brown N/A N/A Exudate Color: Thickened N/A N/A Wound Margin: Large (67-100%) N/A N/A Granulation A mount: Pink N/A N/A Granulation Quality: Small (1-33%) N/A N/A Necrotic A mount: Fat Layer (Subcutaneous Tissue): Yes N/A N/A Exposed Structures: Fascia: No [name], [name] ([id number]) [id number] Page 3 of 7 Tendon: No Muscle: No Joint: No Bone: No None N/A N/A Epithelialization: Callus: Yes N/A N/A Periwound Skin Texture: Maceration: Yes N/A N/A Periwound Skin Moisture: No Abnormalities Noted N/A N/A Periwound Skin Color: No Abnormality N/A N/A Temperature: Cellular or Tissue Based Product N/A N/A Procedures Performed: T Contact Cast otal Treatment Notes Wound #2 (Foot) Wound Laterality: Plantar, Right, Lateral Cleanser Soap and Water Discharge Instruction: May shower and wash wound with dial antibacterial soap and water prior to dressing change. Wound Cleanser Discharge Instruction: Cleanse the wound with wound cleanser  prior to applying a clean dressing using gauze sponges, not tissue or cotton balls. Peri-Wound Care cotton tipped applicators Topical Primary Dressing Promogran Prisma Matrix, 4.34 (sq in) (silver collagen) Discharge Instruction: Moisten collagen with saline or hydrogel Epicord Secondary Dressing ADAPTIC TOUCH 3x4.25 in Discharge Instruction: Apply over primary dressing as directed. Optifoam Non-Adhesive Dressing, 4x4 in Discharge Instruction: Apply over primary dressing as directed. Woven Gauze Sponges 2x2 in Discharge Instruction: moistened with saline. Apply over primary dressing as directed. Secured With Paper Tape, 2x10 (in/yd) Discharge Instruction: Secure dressing with tape as directed. Compression Wrap Kerlix Roll 4.5x3.1 (in/yd) Discharge Instruction: Apply Kerlix and Coban compression as directed. Compression Stockings Add-Ons Electronic Signature(s) Signed: 03/17/2022 10:32:59 AM By: [name] MD FACS Entered By: [name] on 03/17/2022 10:32:58 -------------------------------------------------------------------------------- Multi-Disciplinary Care Plan Details Patient Name: Date of Service: [name] 03/17/2022 10:00 [name] Patient Account Number: [id number] Date of Birth/Sex: Treating RN: 01-12-1971 (52 y.o. [name] Primary Care Eutha Cude: [name] Other Clinician: Referring Copelyn Widmer: Treating Nain Rudd/Extender: [name] in Treatment: [place] reviewed with physician Active Inactive Nutrition [name], [name] ([id number]) [id number] Page 4 of 7 Nursing Diagnoses: Impaired glucose control: actual or potential Potential for alteratiion in Nutrition/Potential for imbalanced nutrition Goals: Patient/caregiver will maintain therapeutic glucose control Date Initiated: 07/29/2021 Target Resolution Date: 04/18/2022 Goal  Status: Active Interventions: Assess patient nutrition upon admission and as needed per policy Provide education on elevated blood sugars and impact on wound healing Treatment Activities: Patient referred to Primary Care Physician for further nutritional evaluation : 07/29/2021 Notes: Wound/Skin Impairment Nursing Diagnoses: Impaired tissue integrity Goals: Patient/caregiver will verbalize understanding of skin care regimen Date Initiated: 07/04/2021 Target Resolution Date: 05/23/2022 Goal Status: Active Interventions: Assess ulceration(s) every visit Treatment Activities: Skin care regimen initiated : 07/04/2021 Notes: Electronic Signature(s) Signed: 03/17/2022 4:48:14 PM By: Adline Peals Entered By: Adline Peals on 03/17/2022 10:23:46 -------------------------------------------------------------------------------- Pain Assessment Details Patient Name: Date of Service: [name] 03/17/2022 10:00 [name] Patient Account Number: [id number] Date of Birth/Sex: Treating RN: 09/25/1970 (52 y.o. [name] Primary Care Labria Wos: [name] Other Clinician: Referring Alichia Alridge: Treating Nariyah Osias/Extender: [name] in Treatment: 36 Active Problems Location of Pain Severity and Description of Pain Patient Has Paino No Site Locations Rate the pain. Current Pain Level: 0 Pain Management and Medication [name], [name] ([id number]) [id number] Page 5 of  7 Current Pain Management: Electronic Signature(s) Signed: 03/17/2022 4:48:14 PM By: Adline Peals Entered By: Adline Peals on 03/17/2022 09:56:05 -------------------------------------------------------------------------------- Patient/Caregiver Education Details Patient Name: Date of Service: [name], [name] 2/26/2024andnbsp10:00 [name] Patient Account Number: [id number] Date  of Birth/Gender: Treating RN: 1970-05-25 (52 y.o. [name] Primary Care Physician: [name] Other Clinician: Referring Physician: Treating Physician/Extender: [name] in Treatment: 36 Education Assessment Education Provided To: Patient Education Topics Provided Wound/Skin Impairment: Methods: Explain/Verbal Responses: Reinforcements needed, State content correctly Electronic Signature(s) Signed: 03/17/2022 4:48:14 PM By: Adline Peals Entered By: Adline Peals on 03/17/2022 10:23:58 -------------------------------------------------------------------------------- Wound Assessment Details Patient Name: Date of Service: [name] 03/17/2022 10:00 [name] Patient Account Number: [id number] Date of Birth/Sex: Treating RN: [birth year] (52 y.o. [name] Primary Care Kayna Suppa: [name] Other Clinician: Referring Kiandria Clum: Treating [place] Crance/Extender: [name] in Treatment: 36 Wound Status Wound Number: 2 Primary Etiology: Diabetic Wound/Ulcer of the Lower Extremity Wound Location: Right, Lateral, Plantar Foot Wound Status: Open Wounding Event: Gradually Appeared Comorbid History: Type II Diabetes Date Acquired: 12/19/2016 Weeks Of Treatment: 36 Clustered Wound: No Photos Wound Measurements Length: (cm) 0.2 Width: (cm) 0.6 [name], [name] ([id number]) Depth: (cm) 2 Area: (cm) 0.094 Volume: (cm) 0.188 % Reduction in Area: 97.4% % Reduction in Volume: 99% [id number] Page 6 of 7 Epithelialization: None Tunneling: No Undermining: No Wound Description Classification: Grade 3 Wound Margin: Thickened Exudate Amount: Medium Exudate Type: Serosanguineous Exudate Color: red, brown Foul Odor After Cleansing: No Slough/Fibrino Yes Wound Bed Granulation Amount: Large (67-100%) Exposed Structure Granulation  Quality: Pink Fascia Exposed: No Necrotic Amount: Small (1-33%) Fat Layer (Subcutaneous Tissue) Exposed: Yes Necrotic Quality: Adherent Slough Tendon Exposed: No Muscle Exposed: No Joint Exposed: No Bone Exposed: No Periwound Skin Texture Texture Color No Abnormalities Noted: No No Abnormalities Noted: Yes Callus: Yes Temperature / Pain Temperature: No Abnormality Moisture No Abnormalities Noted: Yes Treatment Notes Wound #2 (Foot) Wound Laterality: Plantar, Right, Lateral Cleanser Soap and Water Discharge Instruction: May shower and wash wound with dial antibacterial soap and water prior to dressing change. Wound Cleanser Discharge Instruction: Cleanse the wound with wound cleanser prior to applying a clean dressing using gauze sponges, not tissue or cotton balls. Peri-Wound Care cotton tipped applicators Topical Primary Dressing Promogran Prisma Matrix, 4.34 (sq in) (silver collagen) Discharge Instruction: Moisten collagen with saline or hydrogel Epicord Secondary Dressing ADAPTIC TOUCH 3x4.25 in Discharge Instruction: Apply over primary dressing as directed. Optifoam Non-Adhesive Dressing, 4x4 in Discharge Instruction: Apply over primary dressing as directed. Woven Gauze Sponges 2x2 in Discharge Instruction: moistened with saline. Apply over primary dressing as directed. Secured With Paper Tape, 2x10 (in/yd) Discharge Instruction: Secure dressing with tape as directed. Compression Wrap Kerlix Roll 4.5x3.1 (in/yd) Discharge Instruction: Apply Kerlix and Coban compression as directed. Compression Stockings Add-Ons Electronic Signature(s) Signed: 03/17/2022 4:48:14 PM By: Adline Peals Entered By: Adline Peals on 03/17/2022 10:15:44 [name], [name] ([id number]) [id number] Page 7 of 7 -------------------------------------------------------------------------------- Vitals Details Patient Name: Date of Service: [name]  03/17/2022 10:00 [name] Patient Account Number: [id number] Date of Birth/Sex: Treating RN: October 21, 1970 (52 y.o. [name] Primary Care Charels Stambaugh: [name] Other Clinician: Referring Dori Devino: Treating Arvel Oquinn/Extender: [name] in Treatment: 36 Vital Signs Time Taken: 09:59 Temperature (F): 98.2 Height (in): 72 Pulse (bpm): 72 Weight (lbs): 312 Respiratory Rate (breaths/min): 16  Body Mass Index (BMI): 42.3 Blood Pressure (mmHg): 158/88 Reference Range: 80 - 120 mg / dl Electronic Signature(s) Signed: 03/17/2022 4:48:14 PM By: Adline Peals Entered By: Adline Peals on 03/17/2022 09:59:37

## 2022-03-18 NOTE — Progress Notes (Signed)
[name], Gerado [id number] ([id number]) [id number] Page 1 of 10 Visit Report for 03/17/2022 Chief Complaint Document Details Patient Name: Date of Service: [name], [name] 03/17/2022 10:00 [name] Medical Record Number: [id number] Patient Account Number: [id number] Date of Birth/Sex: Treating RN: 1971/01/16 (52 y.o. [name] Primary Care Provider: [name] Other Clinician: Referring Provider: Treating Provider/Extender: [name] in Treatment: 36 Information Obtained from: Patient Chief Complaint Patients presents for treatment of an open diabetic ulcer Electronic Signature(s) Signed: 03/17/2022 10:33:06 AM By: [name] MD FACS Entered By: [name] on 03/17/2022 10:33:06 -------------------------------------------------------------------------------- Cellular or Tissue Based Product Details Patient Name: Date of Service: [name], [name] 03/17/2022 10:00 [name] Medical Record Number: [id number] Patient Account Number: [id number] Date of Birth/Sex: Treating RN: 05-14-1970 (52 y.o. [name] Primary Care Provider: [name] Other Clinician: Referring Provider: Treating Provider/Extender: [name] in Treatment: 36 Cellular or Tissue Based Product Type Wound #2 Right,Lateral,Plantar Foot Applied to: Performed By: Physician [name], MD Cellular or Tissue Based Product Type: Epicord Level of Consciousness (Pre-procedure): Awake and Alert Pre-procedure Verification/Time Out Yes - 10:21 Taken: Location: genitalia / hands / feet / multiple digits Wound Size (sq cm): 0.12 Product Size (sq cm): 6 Waste Size (sq cm): 0 Amount of Product Applied (sq cm): 6 Instrument Used: Forceps, Scissors Lot #: [id number] Expiration Date: 09/21/2026 Fenestrated: No Reconstituted: Yes Solution Type: normal saline Solution Amount: 2 ml Lot #: [id number] Solution Expiration Date:  06/19/2024 Secured: Yes Secured With: Steri-Strips Dressing Applied: Yes Primary Dressing: adaptic Procedural Pain: 0 Post Procedural Pain: 0 Response to Treatment: Procedure was tolerated well Level of Consciousness (Post- Awake and Alert procedure): Post Procedure Diagnosis Same as Pre-procedure Notes scribed for [name] by Adline Peals, RN Electronic Signature(s) [name], [place] ([id number]) [phone].pdf Page 2 of 10 Signed: 03/17/2022 12:01:26 PM By: [name] MD FACS Signed: 03/17/2022 4:48:14 PM By: Sabas Sous By: Adline Peals on 03/17/2022 10:22:35 -------------------------------------------------------------------------------- HPI Details Patient Name: Date of Service: [name], [name] 03/17/2022 10:00 [name] Medical Record Number: [id number] Patient Account Number: [id number] Date of Birth/Sex: Treating RN: 1970-09-13 (52 y.o. 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 The most recent amputation was in August 2022. At that operation, antibiotic beads were placed in the wound. He has been managed by podiatry for his procedures and management of his wounds. He has been in a Water engineer. He is on oral doxycycline. They have been using Betadine wet to dry dressings along with Iodosorb. The patient states that when he thinks the wound is getting too dry, he applies topical Neosporin. At his last visit, on June 7 of this year, the podiatrist determined that he felt the wound was stalled and referred him to wound care for additional evaluation and  management. An MRI has been ordered, but not yet scheduled or performed. Pathology from his operation in August 2022 demonstrated findings consistent with chronic osteomyelitis. Today, there is a large irregular wound on the plantar surface of his right foot, at about the level of the fifth metatarsal head. This tracks through to a pinpoint opening on the dorsal lateral portion of  his foot. The intake nurse reported purulent drainage. There is some malodor from the wound. No frank necrosis identified. 07/11/2021: Today, the wounds do not connect. I attempted multiple times from various directions and the shared tunnel is no longer open. He has some slough accumulation on the dorsal part of his foot as well as slough and callus buildup on the plantar surface. His MRI is scheduled for June 29. No purulent drainage or malodor appreciated today. 07/19/2021: The lateral foot wound has closed and there is no tunnel connecting the plantar foot wound to that site. The plantar foot wound still probes quite deeply, however. There is some slough, eschar, and nonviable tissue accumulated in the wound bed. No malodorous drainage present. His MRI was performed yesterday and is consistent with fairly extensive osteomyelitis. 07/29/2021: The patient has an appointment with infectious disease on July 18 to treat his osteomyelitis. The plantar wound still probes quite deeply, approaching bone. The orifice has narrowed quite substantially, however, making it more difficult for him to pack. 08/05/2021: The tunnel connecting the lateral foot wound to the plantar foot wound has reopened. He sees infectious disease tomorrow to discuss long-term antibiotic treatment for osteomyelitis. There was a bit of murky drainage in the wound, but this was noted after he had had topical lidocaine applied so may have just been a blob of the anesthetic. No odor or frank purulent drainage. The wound probes deeply at the midfoot approaching  bone. He does have MRI results confirming his diagnosis of osteomyelitis. He has failed to progress with conventional treatment and I think his best chance for preservation of the foot is to initiate hyperbaric oxygen therapy. 08/13/2021: The tunnel connecting the 2 wounds has closed again. He has a PICC line and is getting IV daptomycin and cefepime. EKG and chest x-ray are within normal limits. He is scheduled to start hyperbaric oxygen therapy tomorrow. The wound in his midfoot probes deeply, approaching bone. The skin at the orifice continues to heaped up and threatens to close over despite the large cavity within. No erythema, induration, or purulent drainage. The wound on his lateral foot is fairly small and quite clean. 08/19/2021: The lateral foot wound has nearly closed. The wound in his midfoot does not probe quite as deeply today. The skin at the orifice continues to try to roll in and obscure the opening. No frankly necrotic tissue appreciated. He is tolerating hyperbaric oxygen therapy. 08/26/2021: The lateral foot wound has closed completely. The wound in his midfoot is shallower again today. The wound orifice is contracting. We are using gentamicin and silver alginate. He continues on IV daptomycin and cefepime and is tolerating hyperbaric oxygen therapy without difficulty. 09/02/2021: His foot is a little bit sore today but he was up walking on it all weekend doing back to school shopping with his daughter. The tunneling is down to about 3 cm and I do not appreciate bone at the tip of the probe. The wound is clean but has some callus creating an overhanging lip at the distal aspect. He continues to receive hyperbaric oxygen therapy as well as IV daptomycin and cefepime. 09/09/2021: His wound continues to improve. The tunnel has come in by 0.9 cm. He continues to form callus around the orifice of the wound. He is tolerating hyperbaric oxygen therapy along with his IV antibiotics. 09/16/2021:  The tunneling is down to just half a centimeter. He continues to build up callus around the orifice of the wound. He completed his course of  IV antibiotics and his PICC line is scheduled to be removed this Friday. He is tolerating hyperbaric oxygen without difficulty. We have been applying topical gentamicin and silver alginate to his wound. 09/24/2021: The wound depth has come in even further. There is a little bit of callus buildup around the orifice. The opening is quite narrow, at this point. 09/30/2021: The wound depth is about the same this week. The opening continues to contract with callus accumulation. There is some discoloration of the skin on the lateral part of his foot and he admitted to walking more than usual this weekend and also wearing a different pair shoes. He continues to tolerate hyperbaric oxygen therapy without difficulty. 10/07/2021: The wound depth has contracted a bit and is now approximately 1 to 1.5 cm. The orifice of the wound continues to try and close in over the space. There is just some callus and skin around the margins obscuring the orifice. 10/14/2021: The wound depth has come in a little bit more. The orifice of the wound is rolling inwards with epithelium and callus, making it difficult to pack the wound. 10/21/2021: There is still 1 portion of the wound, at the lateral aspect, that is still a couple of centimeters deep. The architecture of the wound makes this somewhat challenging to access and pack. Everything else looks clean. He has his usual accumulation of callus and skin, narrowing the orifice. [name], [name] [id number] ([id number]) [id number] Page 3 of 10 10/29/2021: No significant change to the depth of the wound. The orifice continues to contract with callus and skin narrowing the opening. His wife has been packing the wound and doing an excellent job. 11/05/2021: The depth of the wound has come in by about a centimeter. There is less  callus accumulation around the orificedespite this, the wound is narrower today. We are still awaiting insurance approval for Kerecis powder. 11/12/2021: The depth of the wound continues to contract. He has accumulated some callus around the orifice, as usual. We have received a verbal confirmation that he is approved for Kerecis, but no formal paperwork has yet been received. 11/19/2021: The depth continues to come in. There is callus accumulation around the orifice, as usual. He has been formally approved for Kerecis, but unfortunately we do not have a billing mechanism for the powder in [employer] yet. We are working to address this so we can begin treatment. 11/25/2021: Continued filling in of the depth of the wound. Continued buildup of callus around the orifice. No concern for infection. As we do not have a way to bill for Kerecis powder, but are capable of billing for the [facility] sheet, we are going to use that on him instead. 12/03/2021: The depth of the wound continues to fill in. As usual, he has accumulated callus and skin around the orifice of the wound. No concern for infection. He will complete his hyperbaric oxygen therapy this week. 12/17/2021: The wound depth continues to contract. There is callus and skin accumulation around the orifice of the wound, as per usual. There is more epithelium on the actual wound surface. Unfortunately, his Leroy Kennedy was not ordered. 12/24/2021: The wound depth has come in a little bit more this week. There is less skin accumulation around the orifice, but still some callus. On questioning, he admits to spending a fair amount of time on his feet. 12/12; this is a very difficult wound to assess size slitlike wound with some depth but minimal opening. I applied Kerecis No. 4 12/18; Kerecis No. 5  01/15/2022: His wound measured deeper today. There is also some evidence of pressure induced tissue injury around the lateral aspect of his foot. The patient reports that  he was wearing different shoes than usual and felt like his foot was rolling a lot over the weekend. There is heavy callus around the orifice of the wound. No concern for infection. 01/21/2022: The depth decreased today from 1.7 to 1.0 cm. Significantly less tissue damage as compared to last week. Still with callus and skin accumulation around the orifice. 01/29/2022: The wound continues to contract and the depth is coming in further. We are still working on getting [employer] for him. 02/04/2022: No real change this week. Still working on the [employer]. 02/11/2022: No significant changes again. We are still working on the [employer]. 02/18/2022: No change to the wound. We have submitted in IVR for [employer]. 02/25/2022: Unfortunately, his wound has deteriorated over the past week. It is deeper and is approaching bone once again. There is more slough accumulation and the tissue shows signs of pressure injury. He says that he has been trying to wear his new work boots, which are waterproof. He says they make his feet sweat excessively. I think our main challenge with him is that we just are not able to adequately offload him while he is still working and on his feet through much of the day. He is willing to consider total contact casting, but will need to make some arrangements with work. On a more positive note, however, he has been approved for Epicord. 03/03/2022: The wound probed a little bit deeper again today. We are planning to put him in a total contact cast as soon as he has cleared it with work to take some time off. 03/10/2022: The wound was a little bit shallower today and the overall appearance is significantly improved. We are planning to put him in a cast today. 03/12/2022: He is here for his first total contact cast change. He denies any issues with pressure, rubbing, or pain. 03/17/2022: For some reason, the wound probes deeper today. There is some slough and nonviable  subcutaneous tissue around the opening. He denies any difficulty with the total contact cast. Electronic Signature(s) Signed: 03/17/2022 10:33:47 AM By: [name] MD FACS Entered By: [name] on 03/17/2022 10:33:47 -------------------------------------------------------------------------------- Physical Exam Details Patient Name: Date of Service: [name], [name] 03/17/2022 10:00 [name] Patient Account Number: [id number] Date of Birth/Sex: Treating RN: 07-26-1970 (52 y.o. [name] Primary Care Provider: [name] Other Clinician: Referring Provider: Treating Provider/Extender: [name] in Treatment: 36 Constitutional Hypertensive, asymptomatic. . . . no acute distress. Respiratory Normal work of breathing on room air. Notes 03/17/2022: For some reason, the wound probes deeper today. There is some slough and nonviable subcutaneous tissue around the opening. [name], [place] [id number] ([id number]) [id number] Page 4 of 10 Electronic Signature(s) Signed: 03/17/2022 10:34:27 AM By: [name] MD FACS Entered By: [name] on 03/17/2022 10:34:27 -------------------------------------------------------------------------------- Physician Orders Details Patient Name: Date of Service: [name], [name] 03/17/2022 10:00 [name] Patient Account Number: [id number] Date of Birth/Sex: Treating RN: October 03, 1970 (52 y.o. [name] Primary Care Provider: [name] Other Clinician: Referring Provider: Treating Provider/Extender: [name] in Treatment: 67 Verbal / Phone Orders: No Diagnosis Coding Follow-up Appointments ppointment in 1 week. - Dr Celine Ahr RM 1 Return A Anesthetic Wound #2 Right,Lateral,Plantar Foot (In clinic) Topical Lidocaine  4% applied to wound bed Cellular or Tissue Based Products Cellular or  Tissue Based Product Type: - Epicord #4 Cellular or Tissue Based Product applied to wound bed, secured with steri-strips, cover with Adaptic or Mepitel. (DO NOT REMOVE). Bathing/ Shower/ Hygiene May shower with protection but do not get wound dressing(s) wet. Protect dressing(s) with water repellant cover (for example, large plastic bag) or a cast cover and may then take shower. Off-Loading Total Contact Cast to Right Lower Extremity Other: - Try to not stand on your feet too much, minimize walking Wound Treatment Wound #2 - Foot Wound Laterality: Plantar, Right, Lateral Cleanser: Soap and Water 1 x Per Week/30 Days Discharge Instructions: May shower and wash wound with dial antibacterial soap and water prior to dressing change. Cleanser: Wound Cleanser (Generic) 1 x Per Week/30 Days Discharge Instructions: Cleanse the wound with wound cleanser prior to applying a clean dressing using gauze sponges, not tissue or cotton balls. Peri-Wound Care: cotton tipped applicators (Generic) 1 x Per Week/30 Days Prim Dressing: Promogran Prisma Matrix, 4.34 (sq in) (silver collagen) 1 x Per Week/30 Days ary Discharge Instructions: Moisten collagen with saline or hydrogel Prim Dressing: Epicord ary 1 x Per Week/30 Days Secondary Dressing: ADAPTIC TOUCH 3x4.25 in 1 x Per Week/30 Days Discharge Instructions: Apply over primary dressing as directed. Secondary Dressing: Optifoam Non-Adhesive Dressing, 4x4 in 1 x Per Week/30 Days Discharge Instructions: Apply over primary dressing as directed. Secondary Dressing: Woven Gauze Sponges 2x2 in 1 x Per Week/30 Days Discharge Instructions: moistened with saline. Apply over primary dressing as directed. Secured With: Paper Tape, 2x10 (in/yd) (Generic) 1 x Per Week/30 Days Discharge Instructions: Secure dressing with tape as directed. Compression Wrap: Kerlix Roll 4.5x3.1 (in/yd) (Generic) 1 x Per Week/30 Days Discharge Instructions: Apply Kerlix and Coban  compression as directed. Patient Medications llergies: No Known Allergies A Notifications Medication Indication Start End 03/17/2022 lidocaine DOSE topical 4 % cream - cream topical [name], [name] ([id number]) [id number] Page 5 of 10 Electronic Signature(s) Signed: 03/17/2022 12:01:26 PM By: [name] MD FACS Entered By: [name] on 03/17/2022 10:34:38 -------------------------------------------------------------------------------- Problem List Details Patient Name: Date of Service: [name], [name] 03/17/2022 10:00 [name] Patient Account Number: [id number] Date of Birth/Sex: Treating RN: 1970-02-27 (52 y.o. 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 03/17/2022 10:00 [name] Patient Account Number: [id number] Date of Birth/Sex: Treating RN: [birth year] (52 y.o. 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 The most recent amputation was  in August 2022. At that operation, antibiotic beads were placed in the wound. He has been managed by podiatry for his procedures and management of his wounds. He has been in a Water engineer. He is on oral doxycycline. They have been using Betadine wet to dry dressings along with Iodosorb. The patient states that when he thinks the wound is getting too dry, he applies topical Neosporin. At his last visit, on June 7 of this year, the podiatrist determined that he felt the wound was stalled and referred him to wound care for additional evaluation and management. An MRI has been ordered, but not yet scheduled or performed. Pathology from his operation in August 2022 demonstrated findings consistent with chronic osteomyelitis. [name], [place] [id number] ([id number]) [id number] Page 6 of 10 Today, there is a large irregular wound on the plantar surface of his right foot, at about the level of the fifth metatarsal head. This tracks through to a pinpoint opening on the dorsal lateral portion of his foot. The intake nurse reported purulent drainage. There is some malodor from the wound. No frank necrosis identified. 07/11/2021: Today, the wounds do not connect. I attempted multiple times from various directions and the shared tunnel is no longer open. He has some slough accumulation on the dorsal part of his foot as well as slough and callus buildup on the plantar surface. His MRI is scheduled for June 29. No purulent drainage or malodor appreciated today. 07/19/2021: The lateral foot wound has closed and there is no tunnel connecting the plantar foot wound to that site. The  plantar foot wound still probes quite deeply, however. There is some slough, eschar, and nonviable tissue accumulated in the wound bed. No malodorous drainage present. His MRI was performed yesterday and is consistent with fairly extensive osteomyelitis. 07/29/2021: The patient has an appointment with infectious disease on July 18 to treat his osteomyelitis. The plantar wound still probes quite deeply, approaching bone. The orifice has narrowed quite substantially, however, making it more difficult for him to pack. 08/05/2021: The tunnel connecting the lateral foot wound to the plantar foot wound has reopened. He sees infectious disease tomorrow to discuss long-term antibiotic treatment for osteomyelitis. There was a bit of murky drainage in the wound, but this was noted after he had had topical lidocaine applied so may have just been a blob of the anesthetic. No odor or frank purulent drainage. The wound probes deeply at the midfoot approaching bone. He does have MRI results confirming his diagnosis of osteomyelitis. He has failed to progress with conventional treatment and I think his best chance for preservation of the foot is to initiate hyperbaric oxygen therapy. 08/13/2021: The tunnel connecting the 2 wounds has closed again. He has a PICC line and is getting IV daptomycin and cefepime. EKG and chest x-ray are within normal limits. He is scheduled to start hyperbaric oxygen therapy tomorrow. The wound in his midfoot probes deeply, approaching bone. The skin at the orifice continues to heaped up and threatens to close over despite the large cavity within. No erythema, induration, or purulent drainage. The wound on his lateral foot is fairly small and quite clean. 08/19/2021: The lateral foot wound has nearly closed. The wound in his midfoot does not probe quite as deeply today. The skin at the orifice continues to try to roll in and obscure the opening. No frankly necrotic tissue appreciated. He is  tolerating hyperbaric oxygen therapy. 08/26/2021: The lateral foot wound has closed completely. The wound in his  midfoot is shallower again today. The wound orifice is contracting. We are using gentamicin and silver alginate. He continues on IV daptomycin and cefepime and is tolerating hyperbaric oxygen therapy without difficulty. 09/02/2021: His foot is a little bit sore today but he was up walking on it all weekend doing back to school shopping with his daughter. The tunneling is down to about 3 cm and I do not appreciate bone at the tip of the probe. The wound is clean but has some callus creating an overhanging lip at the distal aspect. He continues to receive hyperbaric oxygen therapy as well as IV daptomycin and cefepime. 09/09/2021: His wound continues to improve. The tunnel has come in by 0.9 cm. He continues to form callus around the orifice of the wound. He is tolerating hyperbaric oxygen therapy along with his IV antibiotics. 09/16/2021: The tunneling is down to just half a centimeter. He continues to build up callus around the orifice of the wound. He completed his course of IV antibiotics and his PICC line is scheduled to be removed this Friday. He is tolerating hyperbaric oxygen without difficulty. We have been applying topical gentamicin and silver alginate to his wound. 09/24/2021: The wound depth has come in even further. There is a little bit of callus buildup around the orifice. The opening is quite narrow, at this point. 09/30/2021: The wound depth is about the same this week. The opening continues to contract with callus accumulation. There is some discoloration of the skin on the lateral part of his foot and he admitted to walking more than usual this weekend and also wearing a different pair shoes. He continues to tolerate hyperbaric oxygen therapy without difficulty. 10/07/2021: The wound depth has contracted a bit and is now approximately 1 to 1.5 cm. The orifice of the wound continues  to try and close in over the space. There is just some callus and skin around the margins obscuring the orifice. 10/14/2021: The wound depth has come in a little bit more. The orifice of the wound is rolling inwards with epithelium and callus, making it difficult to pack the wound. 10/21/2021: There is still 1 portion of the wound, at the lateral aspect, that is still a couple of centimeters deep. The architecture of the wound makes this somewhat challenging to access and pack. Everything else looks clean. He has his usual accumulation of callus and skin, narrowing the orifice. 10/29/2021: No significant change to the depth of the wound. The orifice continues to contract with callus and skin narrowing the opening. His wife has been packing the wound and doing an excellent job. 11/05/2021: The depth of the wound has come in by about a centimeter. There is less callus accumulation around the orificeoodespite this, the wound is narrower today. We are still awaiting insurance approval for Kerecis powder. 11/12/2021: The depth of the wound continues to contract. He has accumulated some callus around the orifice, as usual. We have received a verbal confirmation that he is approved for Kerecis, but no formal paperwork has yet been received. 11/19/2021: The depth continues to come in. There is callus accumulation around the orifice, as usual. He has been formally approved for Kerecis, but unfortunately we do not have a billing mechanism for the powder in [facility] yet. We are working to address this so we can begin treatment. 11/25/2021: Continued filling in of the depth of the wound. Continued buildup of callus around the orifice. No concern for infection. As we do not have a way to  bill for Kerecis powder, but are capable of billing for the Kerecis sheet, we are going to use that on him instead. 12/03/2021: The depth of the wound continues to fill in. As usual, he has accumulated callus and skin around the  orifice of the wound. No concern for infection. He will complete his hyperbaric oxygen therapy this week. 12/17/2021: The wound depth continues to contract. There is callus and skin accumulation around the orifice of the wound, as per usual. There is more epithelium on the actual wound surface. Unfortunately, his Leroy Kennedy was not ordered. 12/24/2021: The wound depth has come in a little bit more this week. There is less skin accumulation around the orifice, but still some callus. On questioning, he admits to spending a fair amount of time on his feet. 12/12; this is a very difficult wound to assess size slitlike wound with some depth but minimal opening. I applied Kerecis No. 4 12/18; Kerecis No. 5 01/15/2022: His wound measured deeper today. There is also some evidence of pressure induced tissue injury around the lateral aspect of his foot. The patient reports that he was wearing different shoes than usual and felt like his foot was rolling a lot over the weekend. There is heavy callus around the orifice of the wound. No concern for infection. 01/21/2022: The depth decreased today from 1.7 to 1.0 cm. Significantly less tissue damage as compared to last week. Still with callus and skin accumulation around the orifice. [name], [name] [id number] ([id number]) [id number] Page 7 of 10 01/29/2022: The wound continues to contract and the depth is coming in further. We are still working on getting [employer] for him. 02/04/2022: No real change this week. Still working on the [employer]. 02/11/2022: No significant changes again. We are still working on the [employer]. 02/18/2022: No change to the wound. We have submitted in IVR for [employer]. 02/25/2022: Unfortunately, his wound has deteriorated over the past week. It is deeper and is approaching bone once again. There is more slough accumulation and the tissue shows signs of pressure injury. He says that he has been trying to wear  his new work boots, which are waterproof. He says they make his feet sweat excessively. I think our main challenge with him is that we just are not able to adequately offload him while he is still working and on his feet through much of the day. He is willing to consider total contact casting, but will need to make some arrangements with work. On a more positive note, however, he has been approved for Epicord. 03/03/2022: The wound probed a little bit deeper again today. We are planning to put him in a total contact cast as soon as he has cleared it with work to take some time off. 03/10/2022: The wound was a little bit shallower today and the overall appearance is significantly improved. We are planning to put him in a cast today. 03/12/2022: He is here for his first total contact cast change. He denies any issues with pressure, rubbing, or pain. 03/17/2022: For some reason, the wound probes deeper today. There is some slough and nonviable subcutaneous tissue around the opening. He denies any difficulty with the total contact cast. Patient History Information obtained from Patient. Social History Never smoker, Marital Status - Married, Alcohol Use - Never, Drug Use - No History, Caffeine Use - Daily - T coffee. ea; Medical History Endocrine Patient has history of Type II Diabetes Hospitalization/Surgery History - Amuptation of 3rd,4th and 5th toes of  Right foot;Oral Surgery;Anal Fissure surgery; Cholecystectomy. Objective Constitutional Hypertensive, asymptomatic. no acute distress. Vitals Time Taken: 9:59 AM, Height: 72 in, Weight: 312 lbs, BMI: 42.3, Temperature: 98.2 F, Pulse: 72 bpm, Respiratory Rate: 16 breaths/min, Blood Pressure: 158/88 mmHg. Respiratory Normal work of breathing on room air. General Notes: 03/17/2022: For some reason, the wound probes deeper today. There is some slough and nonviable subcutaneous tissue around the opening. Integumentary (Hair, Skin) Wound #2 status is  Open. Original cause of wound was Gradually Appeared. The date acquired was: 12/19/2016. The wound has been in treatment 36 weeks. The wound is located on the [place]. The wound measures 0.2cm length x 0.6cm width x 2cm depth; 0.094cm^2 area and 0.188cm^3 volume. There is Fat Layer (Subcutaneous Tissue) exposed. There is no tunneling or undermining noted. There is a medium amount of serosanguineous drainage noted. The wound margin is thickened. There is large (67-100%) pink granulation within the wound bed. There is a small (1-33%) amount of necrotic tissue within the wound bed including Adherent Slough. The periwound skin appearance had no abnormalities noted for moisture. The periwound skin appearance had no abnormalities noted for color. The periwound skin appearance exhibited: Callus. Periwound temperature was noted as No Abnormality. 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 A skin graft procedure using a bioengineered skin substitute/cellular or tissue based product was performed by [name], MD with the following instrument(s): Forceps and Scissors. Epicord was applied and secured with Steri-Strips. 6 sq cm of product was utilized and 0 sq cm was wasted. Post Application, adaptic was applied. A Time Out was conducted at 10:21, prior to the start of the procedure. The procedure was tolerated well with a pain level of 0 throughout and a pain level of 0 following the procedure. Post procedure Diagnosis Wound #2: Same as Pre-Procedure General Notes:  scribed for [name] by Adline Peals, RN. Pre-procedure diagnosis of Wound #2 is a Diabetic Wound/Ulcer of the Lower Extremity located on the Right,Lateral,Plantar Foot . There was a T Contact otal Cast Procedure by [name], MD. Post procedure Diagnosis Wound #2: Same as Pre-Procedure Notes: scribed for [name] by Adline Peals, RN. Plan Follow-up Appointments: Return Appointment in 1 week. - Dr Celine Ahr RM 1 Anesthetic: Wound #2 Right,Lateral,Plantar Foot: (In clinic) Topical Lidocaine 4% applied to wound bed Cellular or Tissue Based Products: Cellular or Tissue Based Product Type: - Epicord #4 Cellular or Tissue Based Product applied to wound bed, secured with steri-strips, cover with Adaptic or Mepitel. (DO NOT REMOVE). Bathing/ Shower/ Hygiene: May shower with protection but do not get wound dressing(s) wet. Protect dressing(s) with water repellant cover (for example, large plastic bag) or a cast cover and may then take shower. Off-Loading: T Contact Cast to Right Lower Extremity otal Other: - Try to not stand on your feet too much, minimize walking The following medication(s) was prescribed: lidocaine topical 4 % cream cream topical was prescribed at facility WOUND #2: - Foot Wound Laterality: Plantar, Right, Lateral Cleanser: Soap and Water 1 x Per Week/30 Days Discharge Instructions: May shower and wash wound with dial antibacterial soap and water prior to dressing change. Cleanser: Wound Cleanser (Generic) 1 x  Per Week/30 Days Discharge Instructions: Cleanse the wound with wound cleanser prior to applying a clean dressing using gauze sponges, not tissue or cotton balls. Peri-Wound Care: cotton tipped applicators (Generic) 1 x Per Week/30 Days Prim Dressing: Promogran Prisma Matrix, 4.34 (sq in) (silver collagen) 1 x Per Week/30 Days ary Discharge Instructions: Moisten collagen with saline or hydrogel Prim Dressing: Epicord 1 x Per Week/30  Days ary Secondary Dressing: ADAPTIC TOUCH 3x4.25 in 1 x Per Week/30 Days Discharge Instructions: Apply over primary dressing as directed. Secondary Dressing: Optifoam Non-Adhesive Dressing, 4x4 in 1 x Per Week/30 Days Discharge Instructions: Apply over primary dressing as directed. Secondary Dressing: Woven Gauze Sponges 2x2 in 1 x Per Week/30 Days Discharge Instructions: moistened with saline. Apply over primary dressing as directed. Secured With: Paper T ape, 2x10 (in/yd) (Generic) 1 x Per Week/30 Days Discharge Instructions: Secure dressing with tape as directed. Com pression Wrap: Kerlix Roll 4.5x3.1 (in/yd) (Generic) 1 x Per Week/30 Days Discharge Instructions: Apply Kerlix and Coban compression as directed. 03/17/2022: For some reason, the wound probes deeper today. There is some slough and nonviable subcutaneous tissue around the opening. The slough was manually removed with gauze. Endoform was moistened with saline and packed into the depths of the wound. There was insufficient skin substitute product to fill the entire cavity and so Prisma silver collagen was used to fill the remaining space. Everything was secured with Adaptic and Steri- Strips. A total contact cast was then applied in standard fashion. Follow-up in 1 week. Electronic Signature(s) Signed: 03/17/2022 10:45:57 AM By: [name] MD FACS Entered By: [name] on 03/17/2022 10:45:56 -------------------------------------------------------------------------------- HxROS Details Patient Name: Date of Service: [name], [name] 03/17/2022 10:00 [name] Medical Record Number: [id number] Patient Account Number: [id number] Date of Birth/Sex: Treating RN: 18-Jun-1970 (52 y.o. [name] Primary Care Provider: [name] Other Clinician: Referring Provider: Treating Provider/Extender: [name] in Treatment: 36 [name], [name] ([id number]) [id number] Page 9 of  10 Information Obtained From Patient Endocrine Medical History: Positive for: Type II Diabetes Immunizations Pneumococcal Vaccine: Received Pneumococcal Vaccination: Yes Received Pneumococcal Vaccination On or After 60th Birthday: No Implantable Devices None Hospitalization / Surgery History Type of Hospitalization/Surgery Amuptation of 3rd,4th and 5th toes of Right foot;Oral Surgery;Anal Fissure surgery; Cholecystectomy Family and Social History Never smoker; Marital Status - Married; Alcohol Use: Never; Drug Use: No History; Caffeine Use: Daily - T coffee; Financial Concerns: No; Food, ea; Clothing or Shelter Needs: No; Support System Lacking: No; Transportation Concerns: No Electronic Signature(s) Signed: 03/17/2022 12:01:26 PM By: [name] MD FACS Entered By: [name] on 03/17/2022 10:33:53 -------------------------------------------------------------------------------- Total Contact Cast Details Patient Name: Date of Service: [name], [name] 03/17/2022 10:00 [name] Medical Record Number: [id number] Patient Account Number: [id number] Date of Birth/Sex: Treating RN: 06-26-1970 (52 y.o. [name] Primary Care Provider: [name] Other Clinician: Referring Provider: Treating Provider/Extender: [name] in Treatment: 36 T Contact Cast Applied for Wound Assessment: otal Wound #2 Right,Lateral,Plantar Foot Performed By: Physician [name], MD Post Procedure Diagnosis Same as Pre-procedure Notes scribed for [name] by Adline Peals, RN Electronic Signature(s) Signed: 03/17/2022 12:01:26 PM By: [name] MD FACS Signed: 03/17/2022 4:48:14 PM By: Adline Peals Entered By: Adline Peals on 03/17/2022 10:20:38 -------------------------------------------------------------------------------- SuperBill Details Patient Name: Date of Service: [name], [name] 03/17/2022 Medical Record  Number: [id number] Patient Account Number: [id number] Date of Birth/Sex: Treating RN: [birth year] (52 y.o. [name] Primary Care Provider:  [name] Other Clinician: Referring Provider: Treating Provider/Extender: [name] in Treatment: 36 Diagnosis Coding ICD-10 Codes Code Description L97.514 Non-pressure chronic ulcer of other part of right foot with necrosis of bone [name], Lamarkus [name] ([id number]) [id number] Page 10 of 10 E11.49 Type 2 diabetes mellitus with other diabetic neurological complication 123456 Other chronic osteomyelitis, right ankle and foot Facility Procedures : CPT4 Code: [id number] Description: [id number] Epicord 2cm x 3cm - per sqcm Modifier: Quantity: 6 : CPT4 Code: [id number] Description: [id number] - SKIN SUB GRAFT FACE/NK/HF/G ICD-10 Diagnosis Description L97.514 Non-pressure chronic ulcer of other part of right foot with necrosis of bone Modifier: Quantity: 1 : CPT4 Code: [id number] Description: [id number] - APPLY TOTAL CONTACT LEG CAST ICD-10 Diagnosis Description L97.514 Non-pressure chronic ulcer of other part of right foot with necrosis of bone Modifier: Quantity: 1 Physician Procedures : CPT4 Code Description Modifier [id number] - WC PHYS LEVEL 4 - EST PT ICD-10 Diagnosis Description L97.514 Non-pressure chronic ulcer of other part of right foot with necrosis of bone E11.49 Type 2 diabetes mellitus with other diabetic neurological  complication 123456 Other chronic osteomyelitis, right ankle and foot Quantity: 1 : [id number] - WC PHYS SKIN SUB GRAFT FACE/NK/HF/G ICD-10 Diagnosis Description L97.514 Non-pressure chronic ulcer of other part of right foot with necrosis of bone Quantity: 1 : [id number] - WC PHYS APPLY TOTAL CONTACT CAST ICD-10 Diagnosis Description L97.514 Non-pressure chronic ulcer of other part of right foot with necrosis of bone Quantity: 1 Electronic Signature(s) Signed: 03/17/2022 10:46:26  AM By: [name] MD FACS Entered By: [name] on 03/17/2022 10:46:25

## 2022-03-24 ENCOUNTER — Encounter (HOSPITAL_BASED_OUTPATIENT_CLINIC_OR_DEPARTMENT_OTHER): Payer: BLUE CROSS/BLUE SHIELD | Attending: General Surgery | Admitting: General Surgery

## 2022-03-24 DIAGNOSIS — E11621 Type 2 diabetes mellitus with foot ulcer: Secondary | ICD-10-CM | POA: Insufficient documentation

## 2022-03-24 DIAGNOSIS — E1149 Type 2 diabetes mellitus with other diabetic neurological complication: Secondary | ICD-10-CM | POA: Insufficient documentation

## 2022-03-24 DIAGNOSIS — M86671 Other chronic osteomyelitis, right ankle and foot: Secondary | ICD-10-CM | POA: Diagnosis not present

## 2022-03-24 DIAGNOSIS — L97514 Non-pressure chronic ulcer of other part of right foot with necrosis of bone: Secondary | ICD-10-CM | POA: Insufficient documentation

## 2022-03-25 NOTE — Progress Notes (Signed)
[name] [name], [name] [name] ([id number]) [id number] Page 1 of 10 Visit Report for 03/24/2022 Chief Complaint Document Details Patient Name: Date of Service: [name], [name] [name] 03/24/2022 10:00 A [name] Patient Account Number: [id number] Date of Birth/Sex: Treating [name]: [birth year] (52 y.o. [name] Primary Care Provider: Garret [name] Other Clinician: Referring Provider: Treating Provider/Extender: [name] [name] in Treatment: 37 Information Obtained from: Patient Chief Complaint Patients presents for treatment of an open diabetic ulcer Electronic Signature(s) Signed: 03/24/2022 11:04:33 AM By: [name] Maudlin MD FACS Entered By: [name] [name] on 03/24/2022 11:04:33 -------------------------------------------------------------------------------- Cellular or Tissue Based Product Details Patient Name: Date of Service: [name], [name] [name] 03/24/2022 10:00 A [name] Patient Account Number: [id number] Date of Birth/Sex: Treating [name]: 12/13/1970 (52 y.o. [name] [name] Primary Care Provider: Garret [name] Other Clinician: Referring Provider: Treating Provider/Extender: [name] [name] in Treatment: 37 Cellular or Tissue Based Product Type Wound #2 Right,Lateral,Plantar Foot Applied to: Performed By: Physician [name] Maudlin, MD Cellular or Tissue Based Product Type: Epicord Level of Consciousness (Pre-procedure): Awake and Alert Pre-procedure Verification/Time Out Yes - 10:19 Taken: Location: genitalia / hands / feet / multiple digits Wound Size (sq cm): 0.16 Product Size (sq cm): 6 Waste Size (sq cm): 0 Amount of Product Applied (sq cm): 6 Instrument Used: Curette Lot #: [id number] Expiration Date: 09/21/2026 Fenestrated: No Reconstituted: Yes Solution Type: normal saline Solution Amount: 2 ml Lot #: [id number] Solution Expiration Date:  06/19/2024 Secured: Yes Secured With: Steri-Strips Dressing Applied: Yes Primary Dressing: adaptic Procedural Pain: 0 Post Procedural Pain: 0 Response to Treatment: Procedure was tolerated well Level of Consciousness (Post- Awake and Alert procedure): Post Procedure Diagnosis Same as Pre-procedure Notes scribed for Dr. Celine [name] by [name] [name] Electronic Signature(s) [name] [name], [name] [name] [name] [name] ([id number]) [phone].pdf Page 2 of 10 Signed: 03/24/2022 12:33:57 PM By: [name] Maudlin MD FACS Signed: 03/24/2022 4:50:16 PM By: [name] [name] By: [name] [name] on 03/24/2022 10:20:29 -------------------------------------------------------------------------------- HPI Details Patient Name: Date of Service: [name], [name] [name] 03/24/2022 10:00 A [name] Patient Account Number: [id number] Date of Birth/Sex: Treating [name]: 09/09/1970 (52 y.o. 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 The most recent amputation was in August 2022. At that operation, antibiotic beads were placed in the wound. He has been managed by podiatry for his procedures and management of his wounds. He has been in a Water engineer. He is on oral doxycycline. They have been using Betadine wet to dry dressings along with Iodosorb. The patient states that when he thinks the wound is getting too dry, he applies topical Neosporin. At his last visit, on June 7 of this year, the podiatrist determined that he felt the wound was stalled and referred him to wound care for additional evaluation and management.  An MRI has been ordered, but not yet scheduled or performed. Pathology from his operation in August 2022 demonstrated findings consistent with chronic osteomyelitis. Today, there is a large irregular wound on the plantar surface of his right foot, at about the level of the fifth metatarsal head. This tracks through to a pinpoint opening on the dorsal lateral portion of his  foot. The intake nurse reported purulent drainage. There is some malodor from the wound. No frank necrosis identified. 07/11/2021: Today, the wounds do not connect. I attempted multiple times from various directions and the shared tunnel is no longer open. He has some slough accumulation on the dorsal part of his foot as well as slough and callus buildup on the plantar surface. His MRI is scheduled for June 29. No purulent drainage or malodor appreciated today. 07/19/2021: The lateral foot wound has closed and there is no tunnel connecting the plantar foot wound to that site. The plantar foot wound still probes quite deeply, however. There is some slough, eschar, and nonviable tissue accumulated in the wound bed. No malodorous drainage present. His MRI was performed yesterday and is consistent with fairly extensive osteomyelitis. 07/29/2021: The patient has an appointment with infectious disease on July 18 to treat his osteomyelitis. The plantar wound still probes quite deeply, approaching bone. The orifice has narrowed quite substantially, however, making it more difficult for him to pack. 08/05/2021: The tunnel connecting the lateral foot wound to the plantar foot wound has reopened. He sees infectious disease tomorrow to discuss long-term antibiotic treatment for osteomyelitis. There was a bit of murky drainage in the wound, but this was noted after he had had topical lidocaine applied so may have just been a blob of the anesthetic. No odor or frank purulent drainage. The wound probes deeply at the midfoot approaching bone. He does  have MRI results confirming his diagnosis of osteomyelitis. He has failed to progress with conventional treatment and I think his best chance for preservation of the foot is to initiate hyperbaric oxygen therapy. 08/13/2021: The tunnel connecting the 2 wounds has closed again. He has a PICC line and is getting IV daptomycin and cefepime. EKG and chest x-ray are within normal limits. He is scheduled to start hyperbaric oxygen therapy tomorrow. The wound in his midfoot probes deeply, approaching bone. The skin at the orifice continues to heaped up and threatens to close over despite the large cavity within. No erythema, induration, or purulent drainage. The wound on his lateral foot is fairly small and quite clean. 08/19/2021: The lateral foot wound has nearly closed. The wound in his midfoot does not probe quite as deeply today. The skin at the orifice continues to try to roll in and obscure the opening. No frankly necrotic tissue appreciated. He is tolerating hyperbaric oxygen therapy. 08/26/2021: The lateral foot wound has closed completely. The wound in his midfoot is shallower again today. The wound orifice is contracting. We are using gentamicin and silver alginate. He continues on IV daptomycin and cefepime and is tolerating hyperbaric oxygen therapy without difficulty. 09/02/2021: His foot is a little bit sore today but he was up walking on it all weekend doing back to school shopping with his daughter. The tunneling is down to about 3 cm and I do not appreciate bone at the tip of the probe. The wound is clean but has some callus creating an overhanging lip at the distal aspect. He continues to receive hyperbaric oxygen therapy as well as IV daptomycin and cefepime. 09/09/2021: His wound continues to improve. The tunnel has come in by 0.9 cm. He continues to form callus around the orifice of the wound. He is tolerating hyperbaric oxygen therapy along with his IV antibiotics. 09/16/2021: The tunneling  is down to just half a centimeter. He continues to build up callus around the orifice of the wound. He completed his course of IV  antibiotics and his PICC line is scheduled to be removed this Friday. He is tolerating hyperbaric oxygen without difficulty. We have been applying topical gentamicin and silver alginate to his wound. 09/24/2021: The wound depth has come in even further. There is a little bit of callus buildup around the orifice. The opening is quite narrow, at this point. 09/30/2021: The wound depth is about the same this week. The opening continues to contract with callus accumulation. There is some discoloration of the skin on the lateral part of his foot and he admitted to walking more than usual this weekend and also wearing a different pair shoes. He continues to tolerate hyperbaric oxygen therapy without difficulty. 10/07/2021: The wound depth has contracted a bit and is now approximately 1 to 1.5 cm. The orifice of the wound continues to try and close in over the space. There is just some callus and skin around the margins obscuring the orifice. 10/14/2021: The wound depth has come in a little bit more. The orifice of the wound is rolling inwards with epithelium and callus, making it difficult to pack the wound. 10/21/2021: There is still 1 portion of the wound, at the lateral aspect, that is still a couple of centimeters deep. The architecture of the wound makes this somewhat challenging to access and pack. Everything else looks clean. He has his usual accumulation of callus and skin, narrowing the orifice. [name] [name], [name] [name] ([id number]) [id number] Page 3 of 10 10/29/2021: No significant change to the depth of the wound. The orifice continues to contract with callus and skin narrowing the opening. His wife has been packing the wound and doing an excellent job. 11/05/2021: The depth of the wound has come in by about a centimeter. There is less callus accumulation  around the orificedespite this, the wound is narrower today. We are still awaiting insurance approval for Kerecis powder. 11/12/2021: The depth of the wound continues to contract. He has accumulated some callus around the orifice, as usual. We have received a verbal confirmation that he is approved for Kerecis, but no formal paperwork has yet been received. 11/19/2021: The depth continues to come in. There is callus accumulation around the orifice, as usual. He has been formally approved for Kerecis, but unfortunately we do not have a billing mechanism for the powder in [employer] yet. We are working to address this so we can begin treatment. 11/25/2021: Continued filling in of the depth of the wound. Continued buildup of callus around the orifice. No concern for infection. As we do not have a way to bill for Kerecis powder, but are capable of billing for the [facility] sheet, we are going to use that on him instead. 12/03/2021: The depth of the wound continues to fill in. As usual, he has accumulated callus and skin around the orifice of the wound. No concern for infection. He will complete his hyperbaric oxygen therapy this week. 12/17/2021: The wound depth continues to contract. There is callus and skin accumulation around the orifice of the wound, as per usual. There is more epithelium on the actual wound surface. Unfortunately, his Leroy Kennedy was not ordered. 12/24/2021: The wound depth has come in a little bit more this week. There is less skin accumulation around the orifice, but still some callus. On questioning, he admits to spending a fair amount of time on his feet. 12/12; this is a very difficult wound to assess size slitlike wound with some depth but minimal opening. I applied Kerecis No. 4 12/18; Kerecis No. 5 01/15/2022:  His wound measured deeper today. There is also some evidence of pressure induced tissue injury around the lateral aspect of his foot. The patient reports that he was wearing  different shoes than usual and felt like his foot was rolling a lot over the weekend. There is heavy callus around the orifice of the wound. No concern for infection. 01/21/2022: The depth decreased today from 1.7 to 1.0 cm. Significantly less tissue damage as compared to last week. Still with callus and skin accumulation around the orifice. 01/29/2022: The wound continues to contract and the depth is coming in further. We are still working on getting [employer] for him. 02/04/2022: No real change this week. Still working on the [employer]. 02/11/2022: No significant changes again. We are still working on the [employer]. 02/18/2022: No change to the wound. We have submitted in IVR for [place]. 02/25/2022: Unfortunately, his wound has deteriorated over the past week. It is deeper and is approaching bone once again. There is more slough accumulation and the tissue shows signs of pressure injury. He says that he has been trying to wear his new work boots, which are waterproof. He says they make his feet sweat excessively. I think our main challenge with him is that we just are not able to adequately offload him while he is still working and on his feet through much of the day. He is willing to consider total contact casting, but will need to make some arrangements with work. On a more positive note, however, he has been approved for Epicord. 03/03/2022: The wound probed a little bit deeper again today. We are planning to put him in a total contact cast as soon as he has cleared it with work to take some time off. 03/10/2022: The wound was a little bit shallower today and the overall appearance is significantly improved. We are planning to put him in a cast today. 03/12/2022: He is here for his first total contact cast change. He denies any issues with pressure, rubbing, or pain. 03/17/2022: For some reason, the wound probes deeper today. There is some slough and nonviable subcutaneous tissue around  the opening. He denies any difficulty with the total contact cast. 03/24/2022: The wound is shallower today. There is better granulation tissue visible on the wound surface. Electronic Signature(s) Signed: 03/24/2022 11:05:06 AM By: [name] Maudlin MD FACS Entered By: [name] [name] on 03/24/2022 11:05:06 -------------------------------------------------------------------------------- Physical Exam Details Patient Name: Date of Service: [name], [name] [name] 03/24/2022 10:00 A [name] Patient Account Number: [id number] Date of Birth/Sex: Treating [name]: 08/05/1970 (52 y.o. [name] Primary Care Provider: Garret [name] Other Clinician: Referring Provider: Treating Provider/Extender: [name] [name] in Treatment: 77 Constitutional Slightly hypertensive. . . . no acute distress. Respiratory Normal work of breathing on room air. Notes [name] [name], [name] [name] ([id number]) [id number] Page 4 of 10 03/24/2022: The wound is shallower today. There is better granulation tissue visible on the wound surface. Electronic Signature(s) Signed: 03/24/2022 11:05:35 AM By: [name] Maudlin MD FACS Entered By: [name] [name] on 03/24/2022 11:05:35 -------------------------------------------------------------------------------- Physician Orders Details Patient Name: Date of Service: [name], [name] [name] 03/24/2022 10:00 A [name] Patient Account Number: [id number] Date of Birth/Sex: Treating [name]: [birth year] (52 y.o. 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 - Dr [name] [name] RM 1 Return A Anesthetic Wound #2 Right,Lateral,Plantar Foot (In clinic) Topical Lidocaine 4% applied to wound bed Cellular or Tissue Based Products Cellular or Tissue Based Product Type: - Epicord #5 Cellular or Tissue Based Product applied to wound bed, secured with steri-strips, cover with Adaptic or Mepitel. (DO NOT REMOVE). Bathing/ Shower/ Hygiene May shower with protection but do not get wound dressing(s) wet. Protect dressing(s) with water repellant cover (for example, large plastic bag) or a cast cover and may then take shower. Off-Loading Total Contact Cast to Right Lower Extremity Other: - Try to not stand on your feet too much, minimize walking Wound Treatment Wound #2 - Foot Wound Laterality: Plantar, Right, Lateral Cleanser: Soap and Water 1 x Per Week/30 Days Discharge Instructions: May shower and wash wound with dial antibacterial soap and water prior to dressing change. Cleanser: Wound Cleanser (Generic) 1 x Per Week/30 Days Discharge Instructions: Cleanse the wound with wound cleanser prior to applying a clean dressing using gauze sponges, not tissue or cotton balls. Peri-Wound Care: cotton tipped applicators (Generic) 1 x Per Week/30 Days Prim Dressing: Promogran Prisma Matrix, 4.34 (sq in) (silver collagen) 1 x Per Week/30 Days ary Discharge Instructions: Moisten collagen with saline or hydrogel Prim Dressing: Epicord ary 1 x Per Week/30 Days Secondary Dressing: ADAPTIC TOUCH 3x4.25 in 1 x Per Week/30 Days Discharge Instructions: Apply over primary dressing as directed. Secondary Dressing: Optifoam Non-Adhesive Dressing, 4x4 in 1 x Per Week/30 Days Discharge Instructions: Apply over primary dressing as directed. Secondary Dressing: Woven Gauze Sponges 2x2 in 1 x Per Week/30 Days Discharge Instructions: moistened with saline. Apply over  primary dressing as directed. Secured With: Paper Tape, 2x10 (in/yd) (Generic) 1 x Per Week/30 Days Discharge Instructions: Secure dressing with tape as directed. Compression Wrap: Kerlix Roll 4.5x3.1 (in/yd) (Generic) 1 x Per Week/30 Days Discharge Instructions: Apply Kerlix and Coban compression as directed. [name] [name], [name] [name] ([id number]) [id number] Page 5 of 10 Patient Medications llergies: No Known Allergies A Notifications Medication Indication Start End 03/24/2022 lidocaine DOSE topical 4 % cream - cream topical Electronic Signature(s) Signed: 03/24/2022 12:33:57 PM By: [name] Maudlin MD FACS Entered By: [name] [name] on 03/24/2022 11:05:46 -------------------------------------------------------------------------------- Problem List Details Patient Name: Date of Service: [name], [name] [name] 03/24/2022 10:00 A [name] Patient Account Number: [id number] Date of Birth/Sex: Treating [name]: 08/28/1970 (52 y.o. [name] Primary Care Provider: Garret [name] Other Clinician: Referring Provider: Treating Provider/Extender: [name] [name] in Treatment: 37 Active Problems ICD-10 Encounter Code Description Active Date MDM Diagnosis L97.514 Non-pressure chronic ulcer of other part of right foot with necrosis of bone 07/04/2021 No Yes E11.49 Type 2 diabetes mellitus with other diabetic neurological complication [id number] No Yes M86.671 Other chronic osteomyelitis, right ankle and foot 07/04/2021 No Yes Inactive Problems Resolved Problems Electronic Signature(s) Signed: 03/24/2022 11:04:18 AM By: [name] Maudlin MD FACS Entered By: [name] [name] on 03/24/2022 11:04:17 -------------------------------------------------------------------------------- Progress Note Details Patient Name: Date of Service: [name], [name] [name] 03/24/2022 10:00 A [name] Patient Account Number: [id number] Date of  Birth/Sex: Treating [name]: 1970/06/14 (52 y.o. 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 The most recent amputation was in August 2022. At that operation, antibiotic beads were placed in the wound. He has been managed by podiatry for his procedures and management of his wounds. He has been in a Water engineer. He is on oral doxycycline. They have been using Betadine wet to dry dressings along with Iodosorb. The patient states that when he thinks the wound is getting too dry, he applies topical Neosporin. At his last visit, on June 7 of this year, the podiatrist determined that he felt the wound was stalled and referred him to wound care for additional evaluation and management. An MRI has been ordered, but not yet scheduled or performed. Pathology from his operation in August 2022 demonstrated findings consistent with chronic osteomyelitis. Today, there is a large irregular wound on the plantar surface of his right foot, at about the level of the fifth metatarsal head. This tracks through to a pinpoint opening on the dorsal lateral portion of his foot. The intake nurse reported purulent drainage. There is some malodor from the wound. No frank necrosis identified. 07/11/2021: Today, the wounds do not connect. I attempted multiple times from various directions and the shared tunnel is no longer open. He has some slough accumulation on the dorsal  part of his foot as well as slough and callus buildup on the plantar surface. His MRI is scheduled for June 29. No purulent drainage or malodor appreciated today. 07/19/2021: The lateral foot wound has closed and there is no tunnel connecting the plantar foot wound to that site. The plantar foot wound still probes quite deeply, however. There is some slough, eschar, and nonviable tissue accumulated in the wound bed. No malodorous drainage present. His MRI was performed yesterday and is consistent with fairly extensive osteomyelitis. 07/29/2021: The patient has an appointment with infectious disease on July 18 to treat his osteomyelitis. The plantar wound still probes quite deeply, approaching bone. The orifice has narrowed quite substantially, however, making it more difficult for him to pack. 08/05/2021: The tunnel connecting the lateral foot wound to the plantar foot wound has reopened. He sees infectious disease tomorrow to discuss long-term antibiotic treatment for osteomyelitis. There was a bit of murky drainage in the wound, but this was noted after he had had topical lidocaine applied so may have just been a blob of the anesthetic. No odor or frank purulent drainage. The wound probes deeply at the midfoot approaching bone. He does have MRI results confirming his diagnosis of osteomyelitis. He has failed to progress with conventional treatment and I think his best chance for preservation of the foot is to initiate hyperbaric oxygen therapy. 08/13/2021: The tunnel connecting the 2 wounds has closed again. He has a PICC line and is getting IV daptomycin and cefepime. EKG and chest x-ray are within normal limits. He is scheduled to start hyperbaric oxygen therapy tomorrow. The wound in his midfoot probes deeply, approaching bone. The skin at the orifice continues to heaped up and threatens to close over despite the large cavity within. No erythema, induration, or purulent drainage. The wound on  his lateral foot is fairly small and quite clean. 08/19/2021: The lateral foot wound has nearly closed. The wound in  his midfoot does not probe quite as deeply today. The skin at the orifice continues to try to roll in and obscure the opening. No frankly necrotic tissue appreciated. He is tolerating hyperbaric oxygen therapy. 08/26/2021: The lateral foot wound has closed completely. The wound in his midfoot is shallower again today. The wound orifice is contracting. We are using gentamicin and silver alginate. He continues on IV daptomycin and cefepime and is tolerating hyperbaric oxygen therapy without difficulty. 09/02/2021: His foot is a little bit sore today but he was up walking on it all weekend doing back to school shopping with his daughter. The tunneling is down to about 3 cm and I do not appreciate bone at the tip of the probe. The wound is clean but has some callus creating an overhanging lip at the distal aspect. He continues to receive hyperbaric oxygen therapy as well as IV daptomycin and cefepime. 09/09/2021: His wound continues to improve. The tunnel has come in by 0.9 cm. He continues to form callus around the orifice of the wound. He is tolerating hyperbaric oxygen therapy along with his IV antibiotics. 09/16/2021: The tunneling is down to just half a centimeter. He continues to build up callus around the orifice of the wound. He completed his course of IV antibiotics and his PICC line is scheduled to be removed this Friday. He is tolerating hyperbaric oxygen without difficulty. We have been applying topical gentamicin and silver alginate to his wound. 09/24/2021: The wound depth has come in even further. There is a little bit of callus buildup around the orifice. The opening is quite narrow, at this point. 09/30/2021: The wound depth is about the same this week. The opening continues to contract with callus accumulation. There is some discoloration of the skin on the lateral part of his  foot and he admitted to walking more than usual this weekend and also wearing a different pair shoes. He continues to tolerate hyperbaric oxygen therapy without difficulty. 10/07/2021: The wound depth has contracted a bit and is now approximately 1 to 1.5 cm. The orifice of the wound continues to try and close in over the space. There is just some callus and skin around the margins obscuring the orifice. 10/14/2021: The wound depth has come in a little bit more. The orifice of the wound is rolling inwards with epithelium and callus, making it difficult to pack the wound. 10/21/2021: There is still 1 portion of the wound, at the lateral aspect, that is still a couple of centimeters deep. The architecture of the wound makes this somewhat challenging to access and pack. Everything else looks clean. He has his usual accumulation of callus and skin, narrowing the orifice. 10/29/2021: No significant change to the depth of the wound. The orifice continues to contract with callus and skin narrowing the opening. His wife has been packing the wound and doing an excellent job. 11/05/2021: The depth of the wound has come in by about a centimeter. There is less callus accumulation around the orificeoodespite this, the wound is narrower today. We are still awaiting insurance approval for Kerecis powder. 11/12/2021: The depth of the wound continues to contract. He has accumulated some callus around the orifice, as usual. We have received a verbal confirmation that he is approved for Kerecis, but no formal paperwork has yet been received. 11/19/2021: The depth continues to come in. There is callus accumulation around the orifice, as usual. He has been formally approved for Kerecis, but unfortunately we do not have a billing  mechanism for the powder in [facility] yet. We are working to address this so we can begin treatment. 11/25/2021: Continued filling in of the depth of the wound. Continued buildup of callus around the  orifice. No concern for infection. As we do not have a way to bill for Kerecis powder, but are capable of billing for the [facility] sheet, we are going to use that on him instead. 12/03/2021: The depth of the wound continues to fill in. As usual, he has accumulated callus and skin around the orifice of the wound. No concern for infection. He will complete his hyperbaric oxygen therapy this week. 12/17/2021: The wound depth continues to contract. There is callus and skin accumulation around the orifice of the wound, as per usual. There is more epithelium on the actual wound surface. Unfortunately, his Leroy Kennedy was not ordered. 12/24/2021: The wound depth has come in a little bit more this week. There is less skin accumulation around the orifice, but still some callus. On questioning, he admits to spending a fair amount of time on his feet. [name] [name], [name] [name] ([id number]) [id number] Page 7 of 10 12/12; this is a very difficult wound to assess size slitlike wound with some depth but minimal opening. I applied Kerecis No. 4 12/18; Kerecis No. 5 01/15/2022: His wound measured deeper today. There is also some evidence of pressure induced tissue injury around the lateral aspect of his foot. The patient reports that he was wearing different shoes than usual and felt like his foot was rolling a lot over the weekend. There is heavy callus around the orifice of the wound. No concern for infection. 01/21/2022: The depth decreased today from 1.7 to 1.0 cm. Significantly less tissue damage as compared to last week. Still with callus and skin accumulation around the orifice. 01/29/2022: The wound continues to contract and the depth is coming in further. We are still working on getting [employer] for him. 02/04/2022: No real change this week. Still working on the [employer]. 02/11/2022: No significant changes again. We are still working on the [employer]. 02/18/2022: No change to  the wound. We have submitted in IVR for [place]. 02/25/2022: Unfortunately, his wound has deteriorated over the past week. It is deeper and is approaching bone once again. There is more slough accumulation and the tissue shows signs of pressure injury. He says that he has been trying to wear his new work boots, which are waterproof. He says they make his feet sweat excessively. I think our main challenge with him is that we just are not able to adequately offload him while he is still working and on his feet through much of the day. He is willing to consider total contact casting, but will need to make some arrangements with work. On a more positive note, however, he has been approved for Epicord. 03/03/2022: The wound probed a little bit deeper again today. We are planning to put him in a total contact cast as soon as he has cleared it with work to take some time off. 03/10/2022: The wound was a little bit shallower today and the overall appearance is significantly improved. We are planning to put him in a cast today. 03/12/2022: He is here for his first total contact cast change. He denies any issues with pressure, rubbing, or pain. 03/17/2022: For some reason, the wound probes deeper today. There is some slough and nonviable subcutaneous tissue around the opening. He denies any difficulty with the total contact cast. 03/24/2022: The wound is  shallower today. There is better granulation tissue visible on the wound surface. Patient History Information obtained from Patient. Social History Never smoker, Marital Status - Married, Alcohol Use - Never, Drug Use - No History, Caffeine Use - Daily - T coffee. ea; Medical History Endocrine Patient has history of Type II Diabetes Hospitalization/Surgery History - Amuptation of 3rd,4th and 5th toes of Right foot;Oral Surgery;Anal Fissure surgery; Cholecystectomy. Objective Constitutional Slightly hypertensive. no acute distress. Vitals Time Taken: 10:01 AM,  Height: 72 in, Weight: 312 lbs, BMI: 42.3, Temperature: 98.6 F, Pulse: 84 bpm, Respiratory Rate: 16 breaths/min, Blood Pressure: 146/85 mmHg. Respiratory Normal work of breathing on room air. General Notes: 03/24/2022: The wound is shallower today. There is better granulation tissue visible on the wound surface. Integumentary (Hair, Skin) Wound #2 status is Open. Original cause of wound was Gradually Appeared. The date acquired was: 12/19/2016. The wound has been in treatment 37 weeks. The wound is located on the [place]. The wound measures 0.2cm length x 0.8cm width x 1.6cm depth; 0.126cm^2 area and 0.201cm^3 volume. There is Fat Layer (Subcutaneous Tissue) exposed. There is no tunneling or undermining noted. There is a medium amount of serosanguineous drainage noted. The wound margin is thickened. There is large (67-100%) pink granulation within the wound bed. There is a small (1-33%) amount of necrotic tissue within the wound bed including Adherent Slough. The periwound skin appearance had no abnormalities noted for moisture. The periwound skin appearance had no abnormalities noted for color. The periwound skin appearance exhibited: Callus. Periwound temperature was noted as No Abnormality. 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 A skin graft procedure using a bioengineered skin substitute/cellular or tissue based product was performed by [name] Maudlin, MD with the following instrument(s): Curette. Epicord was applied and secured with Steri-Strips. 6 sq cm  of product was utilized and 0 sq cm was wasted. Post Application, adaptic was applied. A Time Out was conducted at 10:19, prior to the start of the procedure. The procedure was tolerated well with a pain level of 0 throughout and a pain level of 0 following the procedure. Post procedure Diagnosis Wound #2: Same as Pre-Procedure General Notes: scribed for Dr. Celine [name] by [name] [name] Pre-procedure diagnosis of Wound #2 is a Diabetic Wound/Ulcer of the Lower Extremity located on the Right,Lateral,Plantar Foot . There was a T Contact otal Cast Procedure by [name] Maudlin, MD. Post procedure Diagnosis Wound #2: Same as Pre-Procedure Notes: scribed for Dr. Celine [name] by [name] [name] Plan Follow-up Appointments: Return Appointment in 1 week. - Dr [name] [name] RM 1 Anesthetic: Wound #2 Right,Lateral,Plantar Foot: (In clinic) Topical Lidocaine 4% applied to wound bed Cellular or Tissue Based Products: Cellular or Tissue Based Product Type: - Epicord #5 Cellular or Tissue Based Product applied to wound bed, secured with steri-strips, cover with Adaptic or Mepitel. (DO NOT REMOVE). Bathing/ Shower/ Hygiene: May shower with protection but do not get wound dressing(s) wet. Protect dressing(s) with water repellant cover (for example, large plastic bag) or a cast cover and may then take shower. Off-Loading: T Contact Cast to Right Lower Extremity otal Other: - Try to not stand on your feet too much, minimize walking  The following medication(s) was prescribed: lidocaine topical 4 % cream cream topical was prescribed at facility WOUND #2: - Foot Wound Laterality: Plantar, Right, Lateral Cleanser: Soap and Water 1 x Per Week/30 Days Discharge Instructions: May shower and wash wound with dial antibacterial soap and water prior to dressing change. Cleanser: Wound Cleanser (Generic) 1 x Per Week/30 Days Discharge Instructions: Cleanse the wound with wound cleanser prior to applying a clean  dressing using gauze sponges, not tissue or cotton balls. Peri-Wound Care: cotton tipped applicators (Generic) 1 x Per Week/30 Days Prim Dressing: Promogran Prisma Matrix, 4.34 (sq in) (silver collagen) 1 x Per Week/30 Days ary Discharge Instructions: Moisten collagen with saline or hydrogel Prim Dressing: Epicord 1 x Per Week/30 Days ary Secondary Dressing: ADAPTIC TOUCH 3x4.25 in 1 x Per Week/30 Days Discharge Instructions: Apply over primary dressing as directed. Secondary Dressing: Optifoam Non-Adhesive Dressing, 4x4 in 1 x Per Week/30 Days Discharge Instructions: Apply over primary dressing as directed. Secondary Dressing: Woven Gauze Sponges 2x2 in 1 x Per Week/30 Days Discharge Instructions: moistened with saline. Apply over primary dressing as directed. Secured With: Paper T ape, 2x10 (in/yd) (Generic) 1 x Per Week/30 Days Discharge Instructions: Secure dressing with tape as directed. Com pression Wrap: Kerlix Roll 4.5x3.1 (in/yd) (Generic) 1 x Per Week/30 Days Discharge Instructions: Apply Kerlix and Coban compression as directed. 03/24/2022: The wound is shallower today. There is better granulation tissue visible on the wound surface. The wound was prepared to receive the Epicort skin substitute. Epicort was reconstituted with saline and packed into the depths of the wound. T fill the rest o of the space, Prisma silver collagen was moistened with saline and packed into the remaining cavity. Everything was secured with Adaptic and Steri-Strips. A total contact cast was then applied without difficulty. He will follow-up in 1 week. Electronic Signature(s) Signed: 03/24/2022 11:07:47 AM By: [name] Maudlin MD FACS Entered By: [name] [name] on 03/24/2022 11:07:47 [name] [name], [name] [name] ([id number]) [id number] Page 9 of 10 -------------------------------------------------------------------------------- HxROS Details Patient Name: Date of Service: [name], [name]  [name] 03/24/2022 10:00 A [name] Medical Record Number: [id number] Patient Account Number: [id number] Date of Birth/Sex: Treating [name]: 05/04/1970 (52 y.o. [name] Primary Care Provider: Garret [name] Other Clinician: Referring Provider: Treating Provider/Extender: [name] [name] in Treatment: 37 Information Obtained From Patient Endocrine Medical History: Positive for: Type II Diabetes Immunizations Pneumococcal Vaccine: Received Pneumococcal Vaccination: Yes Received Pneumococcal Vaccination On or After 60th Birthday: No Implantable Devices None Hospitalization / Surgery History Type of Hospitalization/Surgery Amuptation of 3rd,4th and 5th toes of Right foot;Oral Surgery;Anal Fissure surgery; Cholecystectomy Family and Social History Never smoker; Marital Status - Married; Alcohol Use: Never; Drug Use: No History; Caffeine Use: Daily - T coffee; Financial Concerns: No; Food, ea; Clothing or Shelter Needs: No; Support System Lacking: No; Transportation Concerns: No Electronic Signature(s) Signed: 03/24/2022 12:33:57 PM By: [name] Maudlin MD FACS Entered By: [name] [name] on 03/24/2022 11:05:12 -------------------------------------------------------------------------------- Total Contact Cast Details Patient Name: Date of Service: [name], [name] [name] 03/24/2022 10:00 A [name] Medical Record Number: [id number] Patient Account Number: [id number] Date of Birth/Sex: Treating [name]: 10-26-1970 (52 y.o. [name] [name] Primary Care Provider: Garret [name] Other Clinician: Referring Provider: Treating Provider/Extender: [name] [name] in Treatment: 37 T Contact Cast Applied for Wound Assessment: otal Wound #2 Right,Lateral,Plantar Foot Performed By: Physician [name] Maudlin, MD Post Procedure Diagnosis Same as Pre-procedure Notes scribed for Dr. Celine [name] by [name] [name] Electronic Signature(s) Signed: 03/24/2022 12:33:57 PM  By: [name] Maudlin MD FACS Signed: 03/24/2022 4:50:16 PM By: [name] [name] By: [name] [name] on 03/24/2022 10:20:39 -------------------------------------------------------------------------------- SuperBill Details Patient Name: Date of Service: [name], [name] [name] 03/24/2022 Medical Record Number: [id number] Patient Account Number: [id number] Date of Birth/Sex: Treating [name]: 1970/07/23 (52 y.o. [name] [name] [name], [name] [name] ([id number]) [id number] Page 10 of 10 Primary Care Provider: Garret [name] Other Clinician: Referring Provider: Treating Provider/Extender: [name] [name] in Treatment: 37 Diagnosis Coding ICD-10 Codes Code Description [id number] Non-pressure chronic ulcer of other part of right foot with necrosis of bone E11.49 Type 2 diabetes mellitus with other diabetic neurological complication 123456 Other chronic osteomyelitis, right ankle and foot Facility Procedures : CPT4 Code: [id number] Description: [id number] Epicord 2cm x 3cm - per sqcm Modifier: Quantity: 6 : CPT4 Code: [id number] Description: [id number] - SKIN SUB GRAFT FACE/NK/HF/G ICD-10 Diagnosis Description L97.514 Non-pressure chronic ulcer of other part of right foot with necrosis of bone Modifier: Quantity: 1 Physician Procedures : CPT4 Code Description Modifier [id number] - WC PHYS LEVEL 4 - EST PT 25 ICD-10 Diagnosis Description L97.514 Non-pressure chronic ulcer of other part of right foot with necrosis of bone E11.49 Type 2 diabetes mellitus with other diabetic  neurological complication Quantity: 1 : [id number] - WC PHYS SKIN SUB GRAFT FACE/NK/HF/G ICD-10 Diagnosis Description L97.514 Non-pressure chronic ulcer of other part of right foot with necrosis of bone Quantity: 1 Electronic Signature(s) Signed: 03/24/2022 11:08:33 AM By: [name] Maudlin MD FACS Entered By: [name] [name] on 03/24/2022 11:08:33

## 2022-03-25 NOTE — Progress Notes (Signed)
[name], [name] [id number] ([id number]) [id number] Page 1 of 7 Visit Report for 03/24/2022 Arrival Information Details Patient Name: Date of Service: [name], [name] 03/24/2022 10:00 [name] Patient Account Number: [id number] Date of Birth/Sex: Treating RN: 08-Aug-1970 (52 y.o. [name] Primary Care [name]: [name] Other Clinician: Referring [name]: Treating [name]/Extender: [name] in Treatment: 86 Visit Information History Since Last Visit Added or deleted any medications: No Patient Arrived: Ambulatory Any new allergies or adverse reactions: No Arrival Time: 09:59 Had a fall or experienced change in No Accompanied By: self activities of daily living that may affect Transfer Assistance: None risk of falls: Patient Identification Verified: Yes Signs or symptoms of abuse/neglect since last visito No Secondary Verification Process Completed: Yes Hospitalized since last visit: No Patient Requires Transmission-Based Precautions: No Implantable device outside of the clinic excluding No Patient Has Alerts: Yes cellular tissue based products placed in the center since last visit: Has Dressing in Place as Prescribed: Yes Has Footwear/Offloading in Place as Prescribed: Yes Right: T Contact Cast otal Pain Present Now: No Electronic Signature(s) Signed: 03/24/2022 4:50:16 PM By: [name] Entered By: [name] on 03/24/2022 09:59:46 -------------------------------------------------------------------------------- Encounter Discharge Information Details Patient Name: Date of Service: [name] 03/24/2022 10:00 [name] Patient Account Number: [id number] Date of Birth/Sex: Treating RN: 11/16/1970 (52 y.o. [name] Primary Care Anatole Apollo: [name] Other Clinician: Referring [name]: Treating [name]/Extender:  [name] in Treatment: 37 Encounter Discharge Information Items Post Procedure Vitals Discharge Condition: Stable Temperature (F): 98.6 Ambulatory Status: Ambulatory Pulse (bpm): 84 Discharge Destination: Home Respiratory Rate (breaths/min): 16 Transportation: Private Auto Blood Pressure (mmHg): 146/85 Accompanied By: self Schedule Follow-up Appointment: Yes Clinical Summary of Care: Patient Declined Electronic Signature(s) Signed: 03/24/2022 4:50:16 PM By: [name] Entered By: [name] on 03/24/2022 10:47:16 -------------------------------------------------------------------------------- Lower Extremity Assessment Details Patient Name: Date of Service: [name] 03/24/2022 10:00 [name] Patient Account Number: [id number] Date of Birth/Sex: Treating RN: [birth year] (52 y.o. [name] Primary Care Daysie Helf: [name] Other Clinician: Referring [name]: Treating [name]/Extender: [name] in Treatment: 37 Edema Assessment Left: [Left: Right] [name]: :] Assessed: [Left: No] [Right: No] Edema: [Left: N] [Right: o] Calf Left: Right: Point of Measurement: From Medial Instep 40.3 cm Ankle Left: Right: Point of Measurement: From Medial Instep 24 cm Vascular Assessment Pulses: Dorsalis Pedis Palpable: [Right:Yes] Electronic Signature(s) Signed: 03/24/2022 4:50:16 PM By: [name] Entered By: [name] on 03/24/2022 10:07:41 -------------------------------------------------------------------------------- Multi Wound Chart Details Patient Name: Date of Service: [name] 03/24/2022 10:00 [name] Patient Account Number: [id number] Date of Birth/Sex: Treating RN: 1970-10-07 (52 y.o. [name] Primary Care Regino Fournet: [name] Other Clinician: Referring Windsor Zirkelbach: Treating Chanika Byland/Extender:  [name] in Treatment: 37 Vital Signs Height(in): 72 Pulse(bpm): 84 Weight(lbs): 312 Blood Pressure(mmHg): 146/85 Body Mass Index(BMI): 42.3 Temperature(F): 98.6 Respiratory Rate(breaths/min): 16 [2:Photos:] [[name]] Right, Lateral, Plantar Foot N/A N/A Wound Location: Gradually Appeared N/A N/A Wounding Event: Diabetic Wound/Ulcer of the Lower N/A N/A Primary Etiology: Extremity Type II Diabetes N/A N/A Comorbid History: 12/19/2016 N/A N/A Date Acquired: 19 N/A N/A Weeks of Treatment: Open N/A N/A Wound Status: No N/A N/A Wound Recurrence: 0.2x0.8x1.6 N/A N/A Measurements L x W x D (cm) 0.126 N/A N/A A (cm) : rea 0.201 N/A N/A Volume (cm) : 96.50%  N/A N/A % Reduction in A rea: 98.90% N/A N/A % Reduction in Volume: Grade 3 N/A N/A Classification: Medium N/A N/A Exudate A mount: Serosanguineous N/A N/A Exudate Type: red, brown N/A N/A Exudate Color: Thickened N/A N/A Wound Margin: Large (67-100%) N/A N/A Granulation A mount: Pink N/A N/A Granulation Quality: Small (1-33%) N/A N/A Necrotic A mount: Fat Layer (Subcutaneous Tissue): Yes N/A N/A Exposed Structures: [name], [name] ([id number]) [id number] Page 3 of 7 Fascia: No Tendon: No Muscle: No Joint: No Bone: No Small (1-33%) N/A N/A Epithelialization: Callus: Yes N/A N/A Periwound Skin Texture: Maceration: Yes N/A N/A Periwound Skin Moisture: No Abnormalities Noted N/A N/A Periwound Skin Color: No Abnormality N/A N/A Temperature: Cellular or Tissue Based Product N/A N/A Procedures Performed: T Contact Cast otal Treatment Notes Wound #2 (Foot) Wound Laterality: Plantar, Right, Lateral Cleanser Soap and Water Discharge Instruction: May shower and wash wound with dial antibacterial soap and water prior to dressing change. Wound Cleanser Discharge Instruction: Cleanse the wound with wound cleanser prior to applying a clean  dressing using gauze sponges, not tissue or cotton balls. Peri-Wound Care cotton tipped applicators Topical Primary Dressing Promogran Prisma Matrix, 4.34 (sq in) (silver collagen) Discharge Instruction: Moisten collagen with saline or hydrogel Epicord Secondary Dressing ADAPTIC TOUCH 3x4.25 in Discharge Instruction: Apply over primary dressing as directed. Optifoam Non-Adhesive Dressing, 4x4 in Discharge Instruction: Apply over primary dressing as directed. Woven Gauze Sponges 2x2 in Discharge Instruction: moistened with saline. Apply over primary dressing as directed. Secured With Paper Tape, 2x10 (in/yd) Discharge Instruction: Secure dressing with tape as directed. Compression Wrap Kerlix Roll 4.5x3.1 (in/yd) Discharge Instruction: Apply Kerlix and Coban compression as directed. Compression Stockings Add-Ons Electronic Signature(s) Signed: 03/24/2022 11:04:23 AM By: [name] MD FACS Entered By: [name] on 03/24/2022 11:04:23 -------------------------------------------------------------------------------- Multi-Disciplinary Care Plan Details Patient Name: Date of Service: [name] 03/24/2022 10:00 [name] Patient Account Number: [id number] Date of Birth/Sex: Treating RN: 1970/05/22 (52 y.o. [name] Primary Care Jackolyn Geron: [name] Other Clinician: Referring Huntley Demedeiros: Treating Lawrence Mitch/Extender: [name] in Treatment: [place] reviewed with physician [address] [name], [name] [name] ([id number]) [id number] Page 4 of 7 Nutrition Nursing Diagnoses: Impaired glucose control: actual or potential Potential for alteratiion in Nutrition/Potential for imbalanced nutrition Goals: Patient/caregiver will maintain therapeutic glucose control Date Initiated: 07/29/2021 Target Resolution Date: 04/18/2022 Goal Status:  Active Interventions: Assess patient nutrition upon admission and as needed per policy Provide education on elevated blood sugars and impact on wound healing Treatment Activities: Patient referred to Primary Care Physician for further nutritional evaluation : 07/29/2021 Notes: Wound/Skin Impairment Nursing Diagnoses: Impaired tissue integrity Goals: Patient/caregiver will verbalize understanding of skin care regimen Date Initiated: 07/04/2021 Target Resolution Date: 05/23/2022 Goal Status: Active Interventions: Assess ulceration(s) every visit Treatment Activities: Skin care regimen initiated : 07/04/2021 Notes: Electronic Signature(s) Signed: 03/24/2022 4:50:16 PM By: [name] Entered By: [name] on 03/24/2022 10:22:28 -------------------------------------------------------------------------------- Pain Assessment Details Patient Name: Date of Service: [name] 03/24/2022 10:00 [name] Patient Account Number: [id number] Date of Birth/Sex: Treating RN: 06-16-1970 (52 y.o. [name] Primary Care Celesta Funderburk: [name] Other Clinician: Referring Stanford Strauch: Treating Reona Zendejas/Extender: [name] in Treatment: 37 Active Problems Location of Pain Severity and Description of Pain Patient Has Paino No Site Locations Rate the pain. Current Pain Level: 0 [name], [name] ([id number]) [id number] Page 5 of 7 Pain Management and  Medication Current Pain Management: Electronic Signature(s) Signed: 03/24/2022 4:50:16 PM By: [name] Entered By: [name] on 03/24/2022 10:01:47 -------------------------------------------------------------------------------- Patient/Caregiver Education Details Patient Name: Date of Service: [name] 3/4/2024andnbsp10:00 [name] Patient Account Number: [id number] Date of  Birth/Gender: Treating RN: Dec 26, 1970 (52 y.o. [name] Primary Care Physician: [name] Other Clinician: Referring Physician: Treating Physician/Extender: [name] in Treatment: 37 Education Assessment Education Provided To: Patient Education Topics Provided Wound/Skin Impairment: Methods: Explain/Verbal Responses: Reinforcements needed, State content correctly Electronic Signature(s) Signed: 03/24/2022 4:50:16 PM By: [name] Entered By: [name] on 03/24/2022 10:22:40 -------------------------------------------------------------------------------- Wound Assessment Details Patient Name: Date of Service: [name] 03/24/2022 10:00 [name] Patient Account Number: [id number] Date of Birth/Sex: Treating RN: Dec 28, 1970 (52 y.o. [name] Primary Care Mancil Pfenning: [name] Other Clinician: Referring Archit Leger: Treating Cloy Cozzens/Extender: [name] in Treatment: 37 Wound Status Wound Number: 2 Primary Etiology: Diabetic Wound/Ulcer of the Lower Extremity Wound Location: Right, Lateral, Plantar Foot Wound Status: Open Wounding Event: Gradually Appeared Comorbid History: Type II Diabetes Date Acquired: 12/19/2016 Weeks Of Treatment: 37 Clustered Wound: No Photos Wound Measurements [name], [name] ([id number]) Length: (cm) 0.2 Width: (cm) 0.8 Depth: (cm) 1.6 Area: (cm) 0.126 Volume: (cm) 0.201 [id number] Page 6 of 7 % Reduction in Area: 96.5% % Reduction in Volume: 98.9% Epithelialization: Small (1-33%) Tunneling: No Undermining: No Wound Description Classification: Grade 3 Wound Margin: Thickened Exudate Amount: Medium Exudate Type: Serosanguineous Exudate Color: red, brown Foul Odor After Cleansing: No Slough/Fibrino Yes Wound Bed Granulation Amount: Large (67-100%) Exposed  Structure Granulation Quality: Pink Fascia Exposed: No Necrotic Amount: Small (1-33%) Fat Layer (Subcutaneous Tissue) Exposed: Yes Necrotic Quality: Adherent Slough Tendon Exposed: No Muscle Exposed: No Joint Exposed: No Bone Exposed: No Periwound Skin Texture Texture Color No Abnormalities Noted: No No Abnormalities Noted: Yes Callus: Yes Temperature / Pain Temperature: No Abnormality Moisture No Abnormalities Noted: Yes Treatment Notes Wound #2 (Foot) Wound Laterality: Plantar, Right, Lateral Cleanser Soap and Water Discharge Instruction: May shower and wash wound with dial antibacterial soap and water prior to dressing change. Wound Cleanser Discharge Instruction: Cleanse the wound with wound cleanser prior to applying a clean dressing using gauze sponges, not tissue or cotton balls. Peri-Wound Care cotton tipped applicators Topical Primary Dressing Promogran Prisma Matrix, 4.34 (sq in) (silver collagen) Discharge Instruction: Moisten collagen with saline or hydrogel Epicord Secondary Dressing ADAPTIC TOUCH 3x4.25 in Discharge Instruction: Apply over primary dressing as directed. Optifoam Non-Adhesive Dressing, 4x4 in Discharge Instruction: Apply over primary dressing as directed. Woven Gauze Sponges 2x2 in Discharge Instruction: moistened with saline. Apply over primary dressing as directed. Secured With Paper Tape, 2x10 (in/yd) Discharge Instruction: Secure dressing with tape as directed. Compression Wrap Kerlix Roll 4.5x3.1 (in/yd) Discharge Instruction: Apply Kerlix and Coban compression as directed. Compression Stockings Add-Ons Electronic Signature(s) Signed: 03/24/2022 4:50:16 PM By: [name] Entered By: [name] on 03/24/2022 10:10:02 [name], [name] ([id number]) [id number] Page 7 of 7 -------------------------------------------------------------------------------- Vitals Details Patient Name: Date of  Service: [name] 03/24/2022 10:00 [name] Patient Account Number: [id number] Date of Birth/Sex: Treating RN: 1970-09-27 (52 y.o. [name] Primary Care Camber Ninh: [name] Other Clinician: Referring Damico Partin: Treating Evanny Ellerbe/Extender: [name] in Treatment: 37 Vital Signs Time Taken: 10:01 Temperature (F): 98.6 Height (in): 72 Pulse (bpm): 84 Weight (lbs): 312 Respiratory Rate (breaths/min):  16 Body Mass Index (BMI): 42.3 Blood Pressure (mmHg): 146/85 Reference Range: 80 - 120 mg / dl Electronic Signature(s) Signed: 03/24/2022 4:50:16 PM By: [name] Entered By: [name] on 03/24/2022 10:01:41

## 2022-03-31 ENCOUNTER — Encounter (HOSPITAL_BASED_OUTPATIENT_CLINIC_OR_DEPARTMENT_OTHER): Payer: BLUE CROSS/BLUE SHIELD | Admitting: General Surgery

## 2022-03-31 DIAGNOSIS — E11621 Type 2 diabetes mellitus with foot ulcer: Secondary | ICD-10-CM | POA: Diagnosis not present

## 2022-04-01 NOTE — Progress Notes (Signed)
[name], [name] ([id number]) [id number] Page 1 of 10 Visit Report for 03/31/2022 Chief Complaint Document Details Patient Name: Date of Service: [name], [name] 03/31/2022 10:00 [name] Medical Record Number: [id number] Patient Account Number: [id number] Date of Birth/Sex: Treating [name]: [birth year] (52 y.o. [name] Primary Care Provider: Garret [name] Other Clinician: Referring Provider: Treating Provider/Extender: [name] in Treatment: 38 Information Obtained from: Patient Chief Complaint Patients presents for treatment of an open diabetic ulcer Electronic Signature(s) Signed: 03/31/2022 11:06:17 AM [name]: [name] Maudlin MD FACS Entered [name]: [name] on [name] 11:06:17 -------------------------------------------------------------------------------- Cellular or Tissue Based Product Details Patient Name: Date of Service: [name], [name] 03/31/2022 10:00 [name] Medical Record Number: [id number] Patient Account Number: [id number] Date of Birth/Sex: Treating [name]: September 06, 1970 (52 y.o. [name] Primary Care Provider: Garret [name] Other Clinician: Referring Provider: Treating Provider/Extender: [name] in Treatment: 38 Cellular or Tissue Based Product Type Wound #2 Right,Lateral,Plantar Foot Applied to: Performed [name]: Physician [name] Maudlin, MD Cellular or Tissue Based Product Type: Epicord Level of Consciousness (Pre-procedure): Awake and Alert Pre-procedure Verification/Time Out Yes - 10:34 Taken: Location: genitalia / hands / feet / multiple digits Wound Size (sq cm): 0.16 Product Size (sq cm): 6 Waste Size (sq cm): 0 Amount of Product Applied (sq cm): 6 Instrument Used: Curette Lot #: [id number] Expiration Date: 09/21/2026 Fenestrated: No Reconstituted: Yes Solution Type: normal saline Solution Amount: 4 ml Lot #: [id number] Solution Expiration Date:  06/19/2024 Secured: Yes Secured With: Steri-Strips Dressing Applied: Yes Primary Dressing: adaptic Procedural Pain: 0 Post Procedural Pain: 0 Response to Treatment: Procedure was tolerated well Level of Consciousness (Post- Awake and Alert procedure): Post Procedure Diagnosis Same as Pre-procedure Notes scribed for Dr. Celine [name] [name] [name] Electronic Signature(s) [name], [name] ([id number]) [id number] Page 2 of 10 Signed: 03/31/2022 4:59:21 PM [name]: [name] Signed: 04/01/2022 7:40:18 AM [name]: [name] Maudlin MD FACS Entered [name]: [name] 10:35:53 -------------------------------------------------------------------------------- HPI Details Patient Name: Date of Service: [name], [name] 03/31/2022 10:00 [name] Medical Record Number: [id number] Patient Account Number: [id number] Date of Birth/Sex: Treating [name]: [birth year] (52 y.o. 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 The most recent amputation was in August 2022. At that operation, antibiotic beads were placed in the wound. He has been managed [name] podiat[facility] for his procedures and management of his wounds. He has been in a Water engineer. He is on oral doxycycline. They have been using Betadine wet to dry dressings along with Iodosorb. The patient states that when he thinks the wound is getting too dry, he applies topical Neosporin. At his last visit, on June 7 of this year, the podiatrist determined that he felt the wound was stalled and referred him to wound care for additional evaluation and  management. An MRI has been ordered, but not yet scheduled or performed. Pathology from his operation in August 2022 demonstrated findings consistent with chronic osteomyelitis. Today, there is a large irregular wound on the plantar surface of his right foot, at about the level of the fifth metatarsal head. This tracks through to a pinpoint opening on the dorsal lateral portion of his  foot. The intake nurse reported purulent drainage. There is some malodor from the wound. No frank necrosis identified. 07/11/2021: Today, the wounds do not connect. I attempted multiple times from various directions and the shared tunnel is no longer open. He has some slough accumulation on the dorsal part of his foot as well as slough and callus buildup on the plantar surface. His MRI is scheduled for June 29. No purulent drainage or malodor appreciated today. 07/19/2021: The lateral foot wound has closed and there is no tunnel connecting the plantar foot wound to that site. The plantar foot wound still probes quite deeply, however. There is some slough, eschar, and nonviable tissue accumulated in the wound bed. No malodorous drainage present. His MRI was performed yesterday and is consistent with fairly extensive osteomyelitis. 07/29/2021: The patient has an appointment with infectious disease on July 18 to treat his osteomyelitis. The plantar wound still probes quite deeply, approaching bone. The orifice has narrowed quite substantially, however, making it more difficult for him to pack. 08/05/2021: The tunnel connecting the lateral foot wound to the plantar foot wound has reopened. He sees infectious disease tomorrow to discuss long-term antibiotic treatment for osteomyelitis. There was a bit of murky drainage in the wound, but this was noted after he had had topical lidocaine applied so may have just been a blob of the anesthetic. No odor or frank purulent drainage. The wound probes deeply at the midfoot approaching  bone. He does have MRI results confirming his diagnosis of osteomyelitis. He has failed to progress with conventional treatment and I think his best chance for preservation of the foot is to initiate hyperbaric oxygen therapy. 08/13/2021: The tunnel connecting the 2 wounds has closed again. He has a PICC line and is getting IV daptomycin and cefepime. EKG and chest x-ray are within normal limits. He is scheduled to start hyperbaric oxygen therapy tomorrow. The wound in his midfoot probes deeply, approaching bone. The skin at the orifice continues to heaped up and threatens to close over despite the large cavity within. No erythema, induration, or purulent drainage. The wound on his lateral foot is fairly small and quite clean. 08/19/2021: The lateral foot wound has nearly closed. The wound in his midfoot does not probe quite as deeply today. The skin at the orifice continues to try to roll in and obscure the opening. No frankly necrotic tissue appreciated. He is tolerating hyperbaric oxygen therapy. 08/26/2021: The lateral foot wound has closed completely. The wound in his midfoot is shallower again today. The wound orifice is contracting. We are using gentamicin and silver alginate. He continues on IV daptomycin and cefepime and is tolerating hyperbaric oxygen therapy without difficulty. 09/02/2021: His foot is a little bit sore today but he was up walking on it all weekend doing back to school shopping with his daughter. The tunneling is down to about 3 cm and I do not appreciate bone at the tip of the probe. The wound is clean but has some callus creating an overhanging lip at the distal aspect. He continues to receive hyperbaric oxygen therapy as well as IV daptomycin and cefepime. 09/09/2021: His wound continues to improve. The tunnel has come in [name] 0.9 cm. He continues to form callus around the orifice of the wound. He is tolerating hyperbaric oxygen therapy along with his IV antibiotics. 09/16/2021:  The tunneling is down to just half a centimeter. He continues to build up callus around the orifice of the wound. He completed his course of IV  antibiotics and his PICC line is scheduled to be removed this Friday. He is tolerating hyperbaric oxygen without difficulty. We have been applying topical gentamicin and silver alginate to his wound. 09/24/2021: The wound depth has come in even further. There is a little bit of callus buildup around the orifice. The opening is quite narrow, at this point. 09/30/2021: The wound depth is about the same this week. The opening continues to contract with callus accumulation. There is some discoloration of the skin on the lateral part of his foot and he admitted to walking more than usual this weekend and also wearing a different pair shoes. He continues to tolerate hyperbaric oxygen therapy without difficulty. 10/07/2021: The wound depth has contracted a bit and is now approximately 1 to 1.5 cm. The orifice of the wound continues to try and close in over the space. There is just some callus and skin around the margins obscuring the orifice. 10/14/2021: The wound depth has come in a little bit more. The orifice of the wound is rolling inwards with epithelium and callus, making it difficult to pack the wound. 10/21/2021: There is still 1 portion of the wound, at the lateral aspect, that is still a couple of centimeters deep. The architecture of the wound makes this somewhat challenging to access and pack. Everything else looks clean. He has his usual accumulation of callus and skin, narrowing the orifice. [name], [name] ([id number]) [id number] Page 3 of 10 10/29/2021: No significant change to the depth of the wound. The orifice continues to contract with callus and skin narrowing the opening. His wife has been packing the wound and doing an excellent job. 11/05/2021: The depth of the wound has come in [name] about a centimeter. There is less  callus accumulation around the orificedespite this, the wound is narrower today. We are still awaiting insurance approval for Kerecis powder. 11/12/2021: The depth of the wound continues to contract. He has accumulated some callus around the orifice, as usual. We have received a verbal confirmation that he is approved for Kerecis, but no formal paperwork has yet been received. 11/19/2021: The depth continues to come in. There is callus accumulation around the orifice, as usual. He has been formally approved for Kerecis, but unfortunately we do not have a billing mechanism for the powder in [employer] yet. We are working to address this so we can begin treatment. 11/25/2021: Continued filling in of the depth of the wound. Continued buildup of callus around the orifice. No concern for infection. As we do not have a way to bill for Kerecis powder, but are capable of billing for the [facility] sheet, we are going to use that on him instead. 12/03/2021: The depth of the wound continues to fill in. As usual, he has accumulated callus and skin around the orifice of the wound. No concern for infection. He will complete his hyperbaric oxygen therapy this week. 12/17/2021: The wound depth continues to contract. There is callus and skin accumulation around the orifice of the wound, as per usual. There is more epithelium on the actual wound surface. Unfortunately, his Leroy Kennedy was not ordered. 12/24/2021: The wound depth has come in a little bit more this week. There is less skin accumulation around the orifice, but still some callus. On questioning, he admits to spending a fair amount of time on his feet. 12/12; this is a very difficult wound to assess size slitlike wound with some depth but minimal opening. I applied Kerecis No. 4 12/18; Kerecis No. 5 01/15/2022:  His wound measured deeper today. There is also some evidence of pressure induced tissue injury around the lateral aspect of his foot. The patient reports that  he was wearing different shoes than usual and felt like his foot was rolling a lot over the weekend. There is heavy callus around the orifice of the wound. No concern for infection. 01/21/2022: The depth decreased today from 1.7 to 1.0 cm. Significantly less tissue damage as compared to last week. Still with callus and skin accumulation around the orifice. 01/29/2022: The wound continues to contract and the depth is coming in further. We are still working on getting [employer] for him. 02/04/2022: No real change this week. Still working on the [employer]. 02/11/2022: No significant changes again. We are still working on the [employer]. 02/18/2022: No change to the wound. We have submitted in IVR for [employer]. 02/25/2022: Unfortunately, his wound has deteriorated over the past week. It is deeper and is approaching bone once again. There is more slough accumulation and the tissue shows signs of pressure injury. He says that he has been trying to wear his new work boots, which are waterproof. He says they make his feet sweat excessively. I think our main challenge with him is that we just are not able to adequately offload him while he is still working and on his feet through much of the day. He is willing to consider total contact casting, but will need to make some arrangements with work. On a more positive note, however, he has been approved for Epicord. 03/03/2022: The wound probed a little bit deeper again today. We are planning to put him in a total contact cast as soon as he has cleared it with work to take some time off. 03/10/2022: The wound was a little bit shallower today and the overall appearance is significantly improved. We are planning to put him in a cast today. 03/12/2022: He is here for his first total contact cast change. He denies any issues with pressure, rubbing, or pain. 03/17/2022: For some reason, the wound probes deeper today. There is some slough and nonviable  subcutaneous tissue around the opening. He denies any difficulty with the total contact cast. 03/24/2022: The wound is shallower today. There is better granulation tissue visible on the wound surface. 03/31/2022: His cast protector got a hole in it and his cast got wet. His foot is macerated. The wound measured a little deeper today. There is some hypertrophic granulation tissue at the orifice of his wound. Electronic Signature(s) Signed: 03/31/2022 11:07:10 AM [name]: [name] Maudlin MD FACS Entered [name]: [name] on [name] 11:07:10 -------------------------------------------------------------------------------- Physical Exam Details Patient Name: Date of Service: [name], [name] 03/31/2022 10:00 [name] Medical Record Number: [id number] Patient Account Number: [id number] Date of Birth/Sex: Treating [name]: 1970-05-31 (52 y.o. [name] Primary Care Provider: Garret [name] Other Clinician: Referring Provider: Treating Provider/Extender: [name] in Treatment: 38 Constitutional Hypertensive, asymptomatic. . . . no acute distress. Respiratory Normal work of breathing on room air. [name], [name] ([id number]) [id number] Page 4 of 10 Notes 03/31/2022: His cast protector got a hole in it and his cast got wet. His foot is macerated. The wound measured a little deeper today. There is some hypertrophic granulation tissue at the orifice of his wound. Electronic Signature(s) Signed: 03/31/2022 11:08:49 AM [name]: [name] Maudlin MD FACS Entered [name]: [name] on [name] 11:08:49 -------------------------------------------------------------------------------- Physician Orders Details Patient Name: Date of Service: [place], [name] 03/31/2022 10:00  [name] Medical Record Number: [id number] Patient Account Number: [id number] Date of Birth/Sex: Treating [name]: Mar 08, 1970 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 - Dr [name] RM 1 Return A Anesthetic Wound #2 Right,Lateral,Plantar Foot (In clinic) Topical Lidocaine 4% applied to wound bed Cellular or Tissue Based Products Cellular or Tissue Based Product Type: - Epicord #6 Cellular or Tissue Based Product applied to wound bed, secured with steri-strips, cover with Adaptic or Mepitel. (DO NOT REMOVE). Bathing/ Shower/ Hygiene May shower with protection but do not get wound dressing(s) wet. Protect dressing(s) with water repellant cover (for example, large plastic bag) or a cast cover and may then take shower. Off-Loading Total Contact Cast to Right Lower Extremity Other: - Try to not stand on your feet too much, minimize walking Wound Treatment Wound #2 - Foot Wound Laterality: Plantar, Right, Lateral Cleanser: Soap and Water 1 x Per Week/30 Days Discharge Instructions: May shower and wash wound with dial antibacterial soap and water prior to dressing change. Cleanser: Wound Cleanser (Generic) 1 x Per Week/30 Days Discharge Instructions: Cleanse the wound with wound cleanser prior to applying a clean dressing using gauze sponges, not tissue or cotton balls. Peri-Wound Care: Zinc Oxide Ointment 30g tube 1 x Per Week/30 Days Discharge Instructions: Apply Zinc Oxide to periwound with each dressing change Peri-Wound Care: cotton tipped applicators (Generic) 1 x Per Week/30 Days Prim Dressing: Promogran Prisma Matrix, 4.34 (sq in) (silver collagen) 1 x Per Week/30 Days ary Discharge Instructions:  Moisten collagen with saline or hydrogel Prim Dressing: Epicord ary 1 x Per Week/30 Days Secondary Dressing: ADAPTIC TOUCH 3x4.25 in 1 x Per Week/30 Days Discharge Instructions: Apply over primary dressing as directed. Secondary Dressing: Optifoam Non-Adhesive Dressing, 4x4 in 1 x Per Week/30 Days Discharge Instructions: Apply over primary dressing as directed. Secondary Dressing: Woven Gauze Sponges 2x2 in 1 x Per Week/30 Days Discharge Instructions: moistened with saline. Apply over primary dressing as directed. [name], [name] ([id number]) [id number] Page 5 of 10 Secured With: Paper Tape, 2x10 (in/yd) (Generic) 1 x Per Week/30 Days Discharge Instructions: Secure dressing with tape as directed. Compression Wrap: Kerlix Roll 4.5x3.1 (in/yd) (Generic) 1 x Per Week/30 Days Discharge Instructions: Apply Kerlix and Coban compression as directed. Patient Medications llergies: No Known Allergies A Notifications Medication Indication Start End 03/31/2022 lidocaine DOSE topical 4 % cream - cream topical Electronic Signature(s) Signed: 04/01/2022 7:40:18 AM [name]: [name] Maudlin MD FACS Entered [name]: [name] on [name] 11:11:16 -------------------------------------------------------------------------------- Problem List Details Patient Name: Date of Service: [name], [name] 03/31/2022 10:00 [name] Medical Record Number: [id number] Patient Account Number: [id number] Date of Birth/Sex: Treating [name]: Nov 29, 1970 (52 y.o. 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 03/31/2022 10:00 [name] Patient Account Number: [id number] Date of Birth/Sex: Treating [name]: 09-16-1970 (52 y.o. 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 The most recent amputation was in August 2022. At that operation, antibiotic beads were placed in the wound. He has been managed [name] pod[facility] for his procedures and management of his wounds. He has been in a Water engineer. He is on oral doxycycline. They have been using Betadine wet to dry dressings along with Iodosorb. The patient states that when he thinks the wound is getting too dry, he applies topical Neosporin. At his last visit, on June 7 of this year, the podiatrist determined that he felt the wound was stalled and referred him to wound care for additional evaluation and management. An MRI has been ordered, but not yet scheduled or performed. Pathology from his operation in August 2022 demonstrated findings consistent with chronic osteomyelitis. Today,  there is a large irregular wound on the plantar surface of his right foot, at about the level of the fifth metatarsal head. This tracks through to a pinpoint opening on the dorsal lateral portion of his foot. The intake nurse reported purulent drainage. There is some malodor from the wound. No frank necrosis identified. 07/11/2021: Today, the wounds do not connect. I attempted multiple times from various directions and the shared tunnel is no longer open. He has some slough accumulation on the dorsal part of his foot as well as slough and callus buildup on the plantar surface. His MRI is scheduled for June 29. No purulent drainage or malodor appreciated today. 07/19/2021: The lateral foot wound has closed and there is no tunnel connecting the plantar foot wound to that site. The plantar foot wound still probes quite deeply, however. There is some slough, eschar, and nonviable tissue accumulated in the wound bed. No malodorous drainage present. His MRI was performed yesterday and is consistent with fairly extensive osteomyelitis. 07/29/2021: The patient has an appointment with infectious disease on July 18 to treat his osteomyelitis. The plantar wound still probes quite deeply, approaching bone. The orifice has narrowed quite substantially, however, making it more difficult for him to pack. 08/05/2021: The tunnel connecting the lateral foot wound to the plantar foot wound has reopened. He sees infectious disease tomorrow to discuss long-term antibiotic treatment for osteomyelitis. There was a bit of murky drainage in the wound, but this was noted after he had had topical lidocaine applied so may have just been a blob of the anesthetic. No odor or frank purulent drainage. The wound probes deeply at the midfoot approaching bone. He does have MRI results confirming his diagnosis of osteomyelitis. He has failed to progress with conventional treatment and I think his best chance for preservation of the foot is  to initiate hyperbaric oxygen therapy. 08/13/2021: The tunnel connecting the 2 wounds has closed again. He has a PICC line  and is getting IV daptomycin and cefepime. EKG and chest x-ray are within normal limits. He is scheduled to start hyperbaric oxygen therapy tomorrow. The wound in his midfoot probes deeply, approaching bone. The skin at the orifice continues to heaped up and threatens to close over despite the large cavity within. No erythema, induration, or purulent drainage. The wound on his lateral foot is fairly small and quite clean. 08/19/2021: The lateral foot wound has nearly closed. The wound in his midfoot does not probe quite as deeply today. The skin at the orifice continues to try to roll in and obscure the opening. No frankly necrotic tissue appreciated. He is tolerating hyperbaric oxygen therapy. 08/26/2021: The lateral foot wound has closed completely. The wound in his midfoot is shallower again today. The wound orifice is contracting. We are using gentamicin and silver alginate. He continues on IV daptomycin and cefepime and is tolerating hyperbaric oxygen therapy without difficulty. 09/02/2021: His foot is a little bit sore today but he was up walking on it all weekend doing back to school shopping with his daughter. The tunneling is down to about 3 cm and I do not appreciate bone at the tip of the probe. The wound is clean but has some callus creating an overhanging lip at the distal aspect. He continues to receive hyperbaric oxygen therapy as well as IV daptomycin and cefepime. 09/09/2021: His wound continues to improve. The tunnel has come in [name] 0.9 cm. He continues to form callus around the orifice of the wound. He is tolerating hyperbaric oxygen therapy along with his IV antibiotics. 09/16/2021: The tunneling is down to just half a centimeter. He continues to build up callus around the orifice of the wound. He completed his course of IV antibiotics and his PICC line is scheduled to  be removed this Friday. He is tolerating hyperbaric oxygen without difficulty. We have been applying topical gentamicin and silver alginate to his wound. 09/24/2021: The wound depth has come in even further. There is a little bit of callus buildup around the orifice. The opening is quite narrow, at this point. 09/30/2021: The wound depth is about the same this week. The opening continues to contract with callus accumulation. There is some discoloration of the skin on the lateral part of his foot and he admitted to walking more than usual this weekend and also wearing a different pair shoes. He continues to tolerate hyperbaric oxygen therapy without difficulty. 10/07/2021: The wound depth has contracted a bit and is now approximately 1 to 1.5 cm. The orifice of the wound continues to try and close in over the space. There is just some callus and skin around the margins obscuring the orifice. 10/14/2021: The wound depth has come in a little bit more. The orifice of the wound is rolling inwards with epithelium and callus, making it difficult to pack the wound. 10/21/2021: There is still 1 portion of the wound, at the lateral aspect, that is still a couple of centimeters deep. The architecture of the wound makes this somewhat challenging to access and pack. Everything else looks clean. He has his usual accumulation of callus and skin, narrowing the orifice. 10/29/2021: No significant change to the depth of the wound. The orifice continues to contract with callus and skin narrowing the opening. His wife has been packing the wound and doing an excellent job. 11/05/2021: The depth of the wound has come in [name] about a centimeter. There is less callus accumulation around the orificeoodespite this, the wound is  narrower today. We are still awaiting insurance approval for Kerecis powder. 11/12/2021: The depth of the wound continues to contract. He has accumulated some callus around the orifice, as usual. We have  received a verbal confirmation that he is approved for Kerecis, but no formal paperwork has yet been received. 11/19/2021: The depth continues to come in. There is callus accumulation around the orifice, as usual. He has been formally approved for Kerecis, but unfortunately we do not have a billing mechanism for the powder in [facility] yet. We are working to address this so we can begin treatment. 11/25/2021: Continued filling in of the depth of the wound. Continued buildup of callus around the orifice. No concern for infection. As we do not have a way to bill for Kerecis powder, but are capable of billing for the [facility] sheet, we are going to use that on him instead. 12/03/2021: The depth of the wound continues to fill in. As usual, he has accumulated callus and skin around the orifice of the wound. No concern for infection. He will complete his hyperbaric oxygen therapy this week. [name], [name] ([id number]) [id number] Page 7 of 10 12/17/2021: The wound depth continues to contract. There is callus and skin accumulation around the orifice of the wound, as per usual. There is more epithelium on the actual wound surface. Unfortunately, his Leroy Kennedy was not ordered. 12/24/2021: The wound depth has come in a little bit more this week. There is less skin accumulation around the orifice, but still some callus. On questioning, he admits to spending a fair amount of time on his feet. 12/12; this is a very difficult wound to assess size slitlike wound with some depth but minimal opening. I applied Kerecis No. 4 12/18; Kerecis No. 5 01/15/2022: His wound measured deeper today. There is also some evidence of pressure induced tissue injury around the lateral aspect of his foot. The patient reports that he was wearing different shoes than usual and felt like his foot was rolling a lot over the weekend. There is heavy callus around the orifice of the wound. No concern for  infection. 01/21/2022: The depth decreased today from 1.7 to 1.0 cm. Significantly less tissue damage as compared to last week. Still with callus and skin accumulation around the orifice. 01/29/2022: The wound continues to contract and the depth is coming in further. We are still working on getting [employer] for him. 02/04/2022: No real change this week. Still working on the [employer]. 02/11/2022: No significant changes again. We are still working on the [employer]. 02/18/2022: No change to the wound. We have submitted in IVR for [employer]. 02/25/2022: Unfortunately, his wound has deteriorated over the past week. It is deeper and is approaching bone once again. There is more slough accumulation and the tissue shows signs of pressure injury. He says that he has been trying to wear his new work boots, which are waterproof. He says they make his feet sweat excessively. I think our main challenge with him is that we just are not able to adequately offload him while he is still working and on his feet through much of the day. He is willing to consider total contact casting, but will need to make some arrangements with work. On a more positive note, however, he has been approved for Epicord. 03/03/2022: The wound probed a little bit deeper again today. We are planning to put him in a total contact cast as soon as he has cleared it with work to take some  time off. 03/10/2022: The wound was a little bit shallower today and the overall appearance is significantly improved. We are planning to put him in a cast today. 03/12/2022: He is here for his first total contact cast change. He denies any issues with pressure, rubbing, or pain. 03/17/2022: For some reason, the wound probes deeper today. There is some slough and nonviable subcutaneous tissue around the opening. He denies any difficulty with the total contact cast. 03/24/2022: The wound is shallower today. There is better granulation tissue visible on  the wound surface. 03/31/2022: His cast protector got a hole in it and his cast got wet. His foot is macerated. The wound measured a little deeper today. There is some hypertrophic granulation tissue at the orifice of his wound. Patient History Information obtained from Patient. Social History Never smoker, Marital Status - Married, Alcohol Use - Never, Drug Use - No History, Caffeine Use - Daily - T coffee. ea; Medical History Endocrine Patient has history of Type II Diabetes Hospitalization/Surgery History - Amuptation of 3rd,4th and 5th toes of Right foot;Oral Surgery;Anal Fissure surgery; Cholecystectomy. Objective Constitutional Hypertensive, asymptomatic. no acute distress. Vitals Time Taken: 10:01 AM, Height: 72 in, Weight: 312 lbs, BMI: 42.3, Temperature: 98.3 F, Pulse: 77 bpm, Respiratory Rate: 16 breaths/min, Blood Pressure: 165/79 mmHg, Capillary Blood Glucose: 177 mg/dl. Respiratory Normal work of breathing on room air. General Notes: 03/31/2022: His cast protector got a hole in it and his cast got wet. His foot is macerated. The wound measured a little deeper today. There is some hypertrophic granulation tissue at the orifice of his wound. Integumentary (Hair, Skin) Wound #2 status is Open. Original cause of wound was Gradually Appeared. The date acquired was: 12/19/2016. The wound has been in treatment 38 weeks. The wound is located on the [place]. The wound measures 0.2cm length x 0.8cm width x 2cm depth; 0.126cm^2 area and 0.251cm^3 volume. There is Fat Layer (Subcutaneous Tissue) exposed. There is no tunneling or undermining noted. There is a medium amount of serosanguineous drainage noted. The wound margin is thickened. There is large (67-100%) pink granulation within the wound bed. There is a small (1-33%) amount of necrotic tissue within the [name], [name] ([id number]) [id number] Page 8 of 10 wound bed including Adherent  Slough. The periwound skin appearance had no abnormalities noted for color. The periwound skin appearance exhibited: Callus, Maceration. Periwound temperature was noted as No Abnormality. 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 A skin graft procedure using a bioengineered skin substitute/cellular or tissue based product was performed [name] [name] Maudlin, MD with the following instrument(s): Curette. Epicord was applied and secured with Steri-Strips. 6 sq cm of product was utilized and 0 sq cm was wasted. Post Application, adaptic was applied. A Time Out was conducted at 10:34, prior to the start of the procedure. The procedure was tolerated well with a pain level of 0 throughout and a pain level of 0 following the procedure. Post procedure Diagnosis Wound #2: Same as Pre-Procedure General Notes: scribed for Dr. Celine [name] [name] [name] Pre-procedure diagnosis of Wound #2 is a Diabetic Wound/Ulcer of the Lower Extremity located on the Right,Lateral,Plantar Foot . There was a T Interior and spatial designer Procedure [name] [name] Maudlin,  MD. Post procedure Diagnosis Wound #2: Same as Pre-Procedure Notes: scribed for Dr. Celine [name] [name] [name] Plan Follow-up Appointments: Return Appointment in 1 week. - Dr [name] RM 1 Anesthetic: Wound #2 Right,Lateral,Plantar Foot: (In clinic) Topical Lidocaine 4% applied to wound bed Cellular or Tissue Based Products: Cellular or Tissue Based Product Type: - Epicord #6 Cellular or Tissue Based Product applied to wound bed, secured with steri-strips, cover with Adaptic or Mepitel. (DO NOT REMOVE). Bathing/ Shower/  Hygiene: May shower with protection but do not get wound dressing(s) wet. Protect dressing(s) with water repellant cover (for example, large plastic bag) or a cast cover and may then take shower. Off-Loading: T Contact Cast to Right Lower Extremity otal Other: - Try to not stand on your feet too much, minimize walking The following medication(s) was prescribed: lidocaine topical 4 % cream cream topical was prescribed at facility WOUND #2: - Foot Wound Laterality: Plantar, Right, Lateral Cleanser: Soap and Water 1 x Per Week/30 Days Discharge Instructions: May shower and wash wound with dial antibacterial soap and water prior to dressing change. Cleanser: Wound Cleanser (Generic) 1 x Per Week/30 Days Discharge Instructions: Cleanse the wound with wound cleanser prior to applying a clean dressing using gauze sponges, not tissue or cotton balls. Peri-Wound Care: Zinc Oxide Ointment 30g tube 1 x Per Week/30 Days Discharge Instructions: Apply Zinc Oxide to periwound with each dressing change Peri-Wound Care: cotton tipped applicators (Generic) 1 x Per Week/30 Days Prim Dressing: Promogran Prisma Matrix, 4.34 (sq in) (silver collagen) 1 x Per Week/30 Days ary Discharge Instructions: Moisten collagen with saline or hydrogel Prim Dressing: Epicord 1 x Per Week/30 Days ary Secondary Dressing: ADAPTIC TOUCH 3x4.25 in 1 x Per Week/30 Days Discharge Instructions: Apply over primary dressing as directed. Secondary Dressing: Optifoam Non-Adhesive Dressing, 4x4 in 1 x Per Week/30 Days Discharge Instructions: Apply over primary dressing as directed. Secondary Dressing: Woven Gauze Sponges 2x2 in 1 x Per Week/30 Days Discharge Instructions: moistened with saline. Apply over primary dressing as directed. Secured With: Paper T ape, 2x10 (in/yd) (Generic) 1 x Per Week/30 Days Discharge Instructions: Secure dressing with tape as directed. Com pression Wrap: Kerlix Roll 4.5x3.1 (in/yd) (Generic) 1 x Per  Week/30 Days Discharge Instructions: Apply Kerlix and Coban compression as directed. 03/31/2022: His cast protector got a hole in it and his cast got wet. His foot is macerated. The wound measured a little deeper today. There is some hypertrophic granulation tissue at the orifice of his wound. I used silver nitrate to chemically cauterize the hypertrophic granulation tissue. Epicort was then rehydrated and packed into the base of the wound. T fill the o [name], [name] [id number] ([id number]) [id number] Page 9 of 10 remaining space, I used Prisma silver collagen. Zinc oxide was liberally applied around the periwound and the skin substitute and collagen were secured with Adaptic and Steri-Strips. A total contact cast was then applied. He will follow-up in 1 week. Electronic Signature(s) Signed: 03/31/2022 11:12:25 AM [name]: [name] Maudlin MD FACS Entered [name]: [name] on [name] 11:12:25 -------------------------------------------------------------------------------- HxROS Details Patient Name: Date of Service: [name], [name] 03/31/2022 10:00 [name] Medical Record Number: [id number] Patient Account Number: [id number] Date of Birth/Sex: Treating [name]: 1970-08-01 (52 y.o. [name] Primary Care Provider: Garret [name] Other Clinician: Referring Provider: Treating Provider/Extender: [name] in Treatment: 38 Information Obtained From Patient Endocrine Medical History: Positive for: Type II Diabetes Immunizations Pneumococcal Vaccine: Received Pneumococcal Vaccination: Yes Received Pneumococcal Vaccination On or After  60th Birthday: No Implantable Devices None Hospitalization / Surgery History Type of Hospitalization/Surgery Amuptation of 3rd,4th and 5th toes of Right foot;Oral Surgery;Anal Fissure surgery; Cholecystectomy Family and Social History Never smoker; Marital Status - Married; Alcohol Use: Never; Drug Use: No History; Caffeine  Use: Daily - T coffee; Financial Concerns: No; Food, ea; Clothing or Shelter Needs: No; Support System Lacking: No; Transportation Concerns: No Electronic Signature(s) Signed: 04/01/2022 7:40:18 AM [name]: [name] Maudlin MD FACS Entered [name]: [name] on [name] 11:08:23 -------------------------------------------------------------------------------- Total Contact Cast Details Patient Name: Date of Service: [name], [name] 03/31/2022 10:00 [name] Patient Account Number: [id number] Date of Birth/Sex: Treating [name]: 06/08/1970 (52 y.o. [name] Primary Care Provider: Garret [name] Other Clinician: Referring Provider: Treating Provider/Extender: [name] in Treatment: 38 T Contact Cast Applied for Wound Assessment: otal Wound #2 Right,Lateral,Plantar Foot Performed [name]: Physician [name] Maudlin, MD Post Procedure Diagnosis Same as Pre-procedure Notes scribed for Dr. Celine [name] [name] [name] Electronic Signature(s) Signed: 03/31/2022 4:59:21 PM [name]: [name] Signed: 04/01/2022 7:40:18 AM [name]: [name] Maudlin MD FACS [name], [name] 04/01/2022 7:40:18 AM [name]: [name] Maudlin MD FACS Signed: Jerilynn [name] ([id number]) [id number] Page 10 of 10 Entered [name]: [name] 10:31:48 -------------------------------------------------------------------------------- SuperBill Details Patient Name: Date of Service: [name], [name] 03/31/2022 Medical Record Number: [id number] Patient Account Number: [id number] Date of Birth/Sex: Treating [name]: 1970-09-29 (52 y.o. [name] Primary Care Provider: Garret [name] Other Clinician: Referring Provider: Treating Provider/Extender: [name] in Treatment: 38 Diagnosis Coding ICD-10 Codes Code Description [phone] Non-pressure chronic ulcer of other part of right foot with necrosis of bone E11.49 Type  2 diabetes mellitus with other diabetic neurological complication 123456 Other chronic osteomyelitis, right ankle and foot Facility Procedures : CPT4 Code: [id number] Description: [id number] Epicord 2cm x 3cm - per sqcm Modifier: Quantity: 6 : CPT4 Code: [id number] Description: [id number] - SKIN SUB GRAFT FACE/NK/HF/G ICD-10 Diagnosis Description L97.514 Non-pressure chronic ulcer of other part of right foot with necrosis of bone Modifier: Quantity: 1 : CPT4 Code: [id number] Description: [id number] - APPLY TOTAL CONTACT LEG CAST ICD-10 Diagnosis Description L97.514 Non-pressure chronic ulcer of other part of right foot with necrosis of bone Modifier: Quantity: 1 Physician Procedures : CPT4 Code Description Modifier [id number] - WC PHYS LEVEL 4 - EST PT ICD-10 Diagnosis Description L97.514 Non-pressure chronic ulcer of other part of right foot with necrosis of bone E11.49 Type 2 diabetes mellitus with other diabetic neurological  complication 123456 Other chronic osteomyelitis, right ankle and foot Quantity: 1 : [id number] - WC PHYS SKIN SUB GRAFT FACE/NK/HF/G ICD-10 Diagnosis Description L97.514 Non-pressure chronic ulcer of other part of right foot with necrosis of bone Quantity: 1 : [id number] - WC PHYS APPLY TOTAL CONTACT CAST ICD-10 Diagnosis Description L97.514 Non-pressure chronic ulcer of other part of right foot with necrosis of bone Quantity: 1 Electronic Signature(s) Signed: 03/31/2022 11:12:58 AM [name]: [name] Maudlin MD FACS Entered [name]: [name] on [name] 11:12:57

## 2022-04-01 NOTE — Progress Notes (Signed)
[name], [name] ([id number]) [id number] Page 1 of 6 Visit Report for 03/31/2022 Arrival Information Details Patient Name: Date of Service: [place], [place] [name] 03/31/2022 10:00 [name] Patient Account Number: [id number] Date of Birth/Sex: Treating RN: [birth year] (52 y.o. [name] Primary Care Osmany Azer: [name] Other Clinician: Referring Eris Breck: Treating Chetan Mehring/Extender: [name] in Treatment: 38 Visit Information History Since Last Visit Added or deleted any medications: No Patient Arrived: Ambulatory Any new allergies or adverse reactions: No Arrival Time: 10:01 Had a fall or experienced change in No Accompanied By: self activities of daily living that may affect Transfer Assistance: None risk of falls: Patient Identification Verified: Yes Signs or symptoms of abuse/neglect since No Secondary Verification Process Completed: Yes last visito Patient Requires Transmission-Based Precautions: No Hospitalized since last visit: No Patient Has Alerts: Yes Implantable device outside of the clinic No excluding cellular tissue based products placed in the center since last visit: Has Dressing in Place as Prescribed: Yes Has Footwear/Offloading in Place as Yes Prescribed: Right: Surgical Shoe with Pressure Relief Insole Pain Present Now: No Electronic Signature(s) Signed: 03/31/2022 4:59:21 PM By: Adline Peals Entered By: Adline Peals on 03/31/2022 10:01:34 -------------------------------------------------------------------------------- Encounter Discharge Information Details Patient Name: Date of Service: [place], [name] 03/31/2022 10:00 [name] Patient Account Number: [id number] Date of Birth/Sex: Treating RN: 29-Dec-1970 [name] Primary Care Warnell Rasnic: [name] Other Clinician: Referring  Tarron Krolak: Treating Anaya Bovee/Extender: [name] in Treatment: 38 Encounter Discharge Information Items Post Procedure Vitals Discharge Condition: Stable Temperature (F): 98.3 Ambulatory Status: Ambulatory Pulse (bpm): 77 Discharge Destination: Home Respiratory Rate (breaths/min): 16 Transportation: Private Auto Blood Pressure (mmHg): 165/79 Accompanied By: self Schedule Follow-up Appointment: Yes Clinical Summary of Care: Patient Declined Electronic Signature(s) Signed: 03/31/2022 4:59:21 PM By: Adline Peals Entered By: Adline Peals on 03/31/2022 11:26:04 -------------------------------------------------------------------------------- Lower Extremity Assessment Details Patient Name: Date of Service: [name], [name] 03/31/2022 10:00 [name] Patient Account Number: [id number] Date of Birth/Sex: Treating RN: Jul 21, 1970 (52 y.o. [name] Primary Care Sadie Pickar: [name] Other Clinician: Referring Markala Sitts: Treating Mckynna Vanloan/Extender: Cannon, Jennifer Hunter, Stephen [name], [name] ([id number]) [id number].pdf Page 2 of 6 Weeks in Treatment: 38 Edema Assessment Assessed: [Left: No] [Right: No] Edema: [Left: N] [Right: o] Calf Left: Right: Point of Measurement: From Medial Instep 42.5 cm Ankle Left: Right: Point of Measurement: From Medial Instep 24.4 cm Vascular Assessment Pulses: Dorsalis Pedis Palpable: [Right:Yes] Electronic Signature(s) Signed: 03/31/2022 4:59:21 PM By: Adline Peals Entered By: Adline Peals on 03/31/2022 10:10:49 -------------------------------------------------------------------------------- Multi Wound Chart Details Patient Name: Date of Service: [name], [name] 03/31/2022 10:00 [name] Patient Account Number: [id number] Date of Birth/Sex: Treating RN: March 30, 1970 (52 y.o. [name] Primary Care  Damani Kelemen: [name] Other Clinician: Referring Zayla Agar: Treating Idan Prime/Extender: [name] in Treatment: 38 Vital Signs Height(in): 72 Capillary Blood Glucose(mg/dl): 177 Weight(lbs): 312 Pulse(bpm): 64 Body Mass Index(BMI): 42.3 Blood Pressure(mmHg): 165/79 Temperature(F): 98.3 Respiratory Rate(breaths/min): 16 [2:Photos:] [N/A:N/A] Right, Lateral, Plantar Foot N/A N/A Wound Location: Gradually Appeared N/A N/A Wounding Event: Diabetic Wound/Ulcer of the Lower N/A N/A Primary Etiology: Extremity Type II Diabetes N/A N/A Comorbid History: 12/19/2016 N/A N/A Date Acquired: 49 N/A N/A Weeks of Treatment: Open N/A N/A Wound Status: No N/A N/A Wound Recurrence: 0.2x0.8x2 N/A N/A Measurements L x W x D (cm) 0.126 N/A N/A A (  cm) : rea 0.251 N/A N/A Volume (cm) : 96.50% N/A N/A % Reduction in A rea: 98.60% N/A N/A % Reduction in Volume: Grade 3 N/A N/A Classification: Medium N/A N/A Exudate A mount: Serosanguineous N/A N/A Exudate Type: red, brown N/A N/A Exudate Color: Thickened N/A N/A Wound Margin: [name], [name] ([id number]) [id number] Page 3 of 6 Large (67-100%) N/A N/A Granulation Amount: Pink N/A N/A Granulation Quality: Small (1-33%) N/A N/A Necrotic Amount: Fat Layer (Subcutaneous Tissue): Yes N/A N/A Exposed Structures: Fascia: No Tendon: No Muscle: No Joint: No Bone: No Small (1-33%) N/A N/A Epithelialization: Callus: Yes N/A N/A Periwound Skin Texture: Maceration: Yes N/A N/A Periwound Skin Moisture: No Abnormalities Noted N/A N/A Periwound Skin Color: No Abnormality N/A N/A Temperature: Cellular or Tissue Based Product N/A N/A Procedures Performed: T Contact Cast otal Treatment Notes Electronic Signature(s) Signed: 03/31/2022 11:05:33 AM By: [name] MD FACS Entered By: [name] on 03/31/2022  11:05:33 -------------------------------------------------------------------------------- Multi-Disciplinary Care Plan Details Patient Name: Date of Service: [name], [name] 03/31/2022 10:00 [name] Patient Account Number: [id number] Date of Birth/Sex: Treating RN: 1970/09/13 (52 y.o. [name] Primary Care Corrin Hingle: [name] Other Clinician: Referring Sarye Kath: Treating Ahmir Bracken/Extender: [name] in Treatment: [place] reviewed with physician Active Inactive Nutrition Nursing Diagnoses: Impaired glucose control: actual or potential Potential for alteratiion in Nutrition/Potential for imbalanced nutrition Goals: Patient/caregiver will maintain therapeutic glucose control Date Initiated: 07/29/2021 Target Resolution Date: 04/18/2022 Goal Status: Active Interventions: Assess patient nutrition upon admission and as needed per policy Provide education on elevated blood sugars and impact on wound healing Treatment Activities: Patient referred to Primary Care Physician for further nutritional evaluation : 07/29/2021 Notes: Wound/Skin Impairment Nursing Diagnoses: Impaired tissue integrity Goals: Patient/caregiver will verbalize understanding of skin care regimen Date Initiated: 07/04/2021 Target Resolution Date: 05/23/2022 Goal Status: Active Interventions: Assess ulceration(s) every visit Treatment Activities: Skin care regimen initiated : 07/04/2021 Notes: [name], [name] ([id number]) [id number] Page 4 of 6 Electronic Signature(s) Signed: 03/31/2022 4:59:21 PM By: Adline Peals Entered By: Adline Peals on 03/31/2022 10:16:09 -------------------------------------------------------------------------------- Pain Assessment Details Patient Name: Date of Service: [name], [name] 03/31/2022 10:00 [name] Patient  Account Number: [id number] Date of Birth/Sex: Treating RN: 1970-12-25 (52 y.o. [name] Primary Care Dejane Scheibe: [name] Other Clinician: Referring Chantay Whitelock: Treating Yana Schorr/Extender: [name] in Treatment: 38 Active Problems Location of Pain Severity and Description of Pain Patient Has Paino No Site Locations Rate the pain. Current Pain Level: 0 Pain Management and Medication Current Pain Management: Electronic Signature(s) Signed: 03/31/2022 4:59:21 PM By: Adline Peals Entered By: Adline Peals on 03/31/2022 10:10:25 -------------------------------------------------------------------------------- Patient/Caregiver Education Details Patient Name: Date of Service: [name], [name] 3/11/2024andnbsp10:00 [place] Record Number: [id number] Patient Account Number: [id number] Date of Birth/Gender: Treating RN: 05/03/1970 (52 y.o. [name] Primary Care Physician: [name] Other Clinician: Referring Physician: Treating Physician/Extender: [name] in Treatment: 27 Education Assessment Education Provided To: Patient Education Topics Provided Wound/Skin Impairment: Methods: Explain/Verbal Responses: Reinforcements needed, State content correctly [name], [name] ([id number]) [id number] Page 5 of 6 Electronic Signature(s) Signed: 03/31/2022 4:59:21 PM By: Adline Peals Entered By: Adline Peals on 03/31/2022 10:16:20 -------------------------------------------------------------------------------- Wound Assessment Details Patient Name: Date of Service: [name], [name] 03/31/2022 10:00 [name] Patient Account Number: [id number] Date of Birth/Sex: Treating RN: [birth year] (52 y.o. [name]  Adline Peals Primary Care Penny Frisbie: [name] Other Clinician: Referring Emilyann Banka: Treating Shaton Lore/Extender:  [name] in Treatment: 38 Wound Status Wound Number: 2 Primary Etiology: Diabetic Wound/Ulcer of the Lower Extremity Wound Location: Right, Lateral, Plantar Foot Wound Status: Open Wounding Event: Gradually Appeared Comorbid History: Type II Diabetes Date Acquired: 12/19/2016 Weeks Of Treatment: 38 Clustered Wound: No Photos Wound Measurements Length: (cm) 0.2 Width: (cm) 0.8 Depth: (cm) 2 Area: (cm) 0.126 Volume: (cm) 0.251 % Reduction in Area: 96.5% % Reduction in Volume: 98.6% Epithelialization: Small (1-33%) Tunneling: No Undermining: No Wound Description Classification: Grade 3 Wound Margin: Thickened Exudate Amount: Medium Exudate Type: Serosanguineous Exudate Color: red, brown Foul Odor After Cleansing: No Slough/Fibrino Yes Wound Bed Granulation Amount: Large (67-100%) Exposed Structure Granulation Quality: Pink Fascia Exposed: No Necrotic Amount: Small (1-33%) Fat Layer (Subcutaneous Tissue) Exposed: Yes Necrotic Quality: Adherent Slough Tendon Exposed: No Muscle Exposed: No Joint Exposed: No Bone Exposed: No Periwound Skin Texture Texture Color No Abnormalities Noted: No No Abnormalities Noted: Yes Callus: Yes Temperature / Pain Temperature: No Abnormality Moisture No Abnormalities Noted: No Maceration: Yes Treatment Notes Wound #2 (Foot) Wound Laterality: Plantar, Right, Lateral [name], [name] ([id number]) [id number] Page 6 of 6 Cleanser Soap and Water Discharge Instruction: May shower and wash wound with dial antibacterial soap and water prior to dressing change. Wound Cleanser Discharge Instruction: Cleanse the wound with wound cleanser prior to applying a clean dressing using gauze sponges, not tissue or cotton balls. Peri-Wound Care Zinc Oxide Ointment 30g tube Discharge Instruction: Apply Zinc Oxide to periwound with each dressing change cotton tipped  applicators Topical Primary Dressing Promogran Prisma Matrix, 4.34 (sq in) (silver collagen) Discharge Instruction: Moisten collagen with saline or hydrogel Epicord Secondary Dressing ADAPTIC TOUCH 3x4.25 in Discharge Instruction: Apply over primary dressing as directed. Optifoam Non-Adhesive Dressing, 4x4 in Discharge Instruction: Apply over primary dressing as directed. Woven Gauze Sponges 2x2 in Discharge Instruction: moistened with saline. Apply over primary dressing as directed. Secured With Paper Tape, 2x10 (in/yd) Discharge Instruction: Secure dressing with tape as directed. Compression Wrap Kerlix Roll 4.5x3.1 (in/yd) Discharge Instruction: Apply Kerlix and Coban compression as directed. Compression Stockings Add-Ons Electronic Signature(s) Signed: 03/31/2022 4:59:21 PM By: Adline Peals Entered By: Adline Peals on 03/31/2022 10:13:48 -------------------------------------------------------------------------------- Vitals Details Patient Name: Date of Service: [name], [name] 03/31/2022 10:00 [name] Medical Record Number: [id number] Patient Account Number: [id number] Date of Birth/Sex: Treating RN: 02-07-1970 (52 y.o. [name] Primary Care Unika Nazareno: [name] Other Clinician: Referring Ziah Leandro: Treating Aella Ronda/Extender: [name] in Treatment: 38 Vital Signs Time Taken: 10:01 Temperature (F): 98.3 Height (in): 72 Pulse (bpm): 77 Weight (lbs): 312 Respiratory Rate (breaths/min): 16 Body Mass Index (BMI): 42.3 Blood Pressure (mmHg): 165/79 Capillary Blood Glucose (mg/dl): 177 Reference Range: 80 - 120 mg / dl Electronic Signature(s) Signed: 03/31/2022 4:59:21 PM By: Adline Peals Entered By: Adline Peals on 03/31/2022 10:01:53

## 2022-04-03 LAB — HM DIABETES EYE EXAM

## 2022-04-04 ENCOUNTER — Ambulatory Visit: Payer: BLUE CROSS/BLUE SHIELD | Admitting: Family Medicine

## 2022-04-07 ENCOUNTER — Encounter (HOSPITAL_BASED_OUTPATIENT_CLINIC_OR_DEPARTMENT_OTHER): Payer: BLUE CROSS/BLUE SHIELD | Admitting: Internal Medicine

## 2022-04-07 DIAGNOSIS — L97514 Non-pressure chronic ulcer of other part of right foot with necrosis of bone: Secondary | ICD-10-CM

## 2022-04-07 DIAGNOSIS — E11621 Type 2 diabetes mellitus with foot ulcer: Secondary | ICD-10-CM | POA: Diagnosis not present

## 2022-04-07 DIAGNOSIS — E1149 Type 2 diabetes mellitus with other diabetic neurological complication: Secondary | ICD-10-CM | POA: Diagnosis not present

## 2022-04-08 NOTE — Progress Notes (Signed)
[name], [name] [id number] ([id number]) [id number] Page 1 of 11 Visit Report for 04/07/2022 Chief Complaint Document Details Patient Name: Date of Service: [name], [place] [name] 04/07/2022 10:00 [name] Patient Account Number: [id number] Date of Birth/Sex: Treating RN: 04-27-1970 (52 y.o. [name] Primary Care Provider: [name] Other Clinician: Referring Provider: Treating Provider/Extender: [name] in Treatment: 39 Information Obtained from: Patient Chief Complaint Patients presents for treatment of an open diabetic ulcer Electronic Signature(s) Signed: 04/07/2022 12:10:44 PM By: [name] DO Entered By: [name] on 04/07/2022 10:48:30 -------------------------------------------------------------------------------- Cellular or Tissue Based Product Details Patient Name: Date of Service: [name], [name] 04/07/2022 10:00 [name] Patient Account Number: [id number] Date of Birth/Sex: Treating RN: Jun 01, 1970 (52 y.o. [name] Primary Care Provider: [name] Other Clinician: Referring Provider: Treating Provider/Extender: [name] in Treatment: 39 Cellular or Tissue Based Product Type Wound #2 Right,Lateral,Plantar Foot Applied to: Performed By: Physician [name], DO Cellular or Tissue Based Product Type: Epicord Level of Consciousness (Pre-procedure): Awake and Alert Pre-procedure Verification/Time Out Yes - 10:42 Taken: Location: genitalia / hands / feet / multiple digits Wound Size (sq cm): 0.27 Product Size (sq cm): 6 Waste Size (sq cm): 0 Amount of Product Applied (sq cm): 6 Instrument Used: Curette Lot #: [id number] Expiration Date: 09/21/2026 Fenestrated: No Reconstituted: Yes Solution Type: normal saline Solution Amount: 3 ml Lot #[id number] Solution Expiration Date: 06/19/2024 Secured:  Yes Secured With: Steri-Strips Dressing Applied: Yes Primary Dressing: adaptic Procedural Pain: 0 Post Procedural Pain: 0 Response to Treatment: Procedure was tolerated well Level of Consciousness (Post- Awake and Alert procedure): Post Procedure Diagnosis Same as Pre-procedure Notes scribed for Dr. Heber Windom by Adline Peals, RN Electronic Signature(s) [name], [name] ([id number]) [phone].pdf Page 2 of 11 Signed: 04/07/2022 12:10:44 PM By: [name] DO Signed: 04/07/2022 4:54:15 PM By: Sabas Sous By: Adline Peals on 04/07/2022 10:47:14 -------------------------------------------------------------------------------- Debridement Details Patient Name: Date of Service: [name], [name] 04/07/2022 10:00 [name] Patient Account Number: [id number] Date of Birth/Sex: Treating RN: 1970/02/13 (52 y.o. [name] Primary Care Provider: [name] Other Clinician: Referring Provider: Treating Provider/Extender: [name] in Treatment: 39 Debridement Performed for Assessment: Wound #2 Right,Lateral,Plantar Foot Performed By: Physician [name], DO Debridement Type: Debridement Severity of Tissue Pre Debridement: Fat layer exposed Level of Consciousness (Pre-procedure): Awake and Alert Pre-procedure Verification/Time Out Yes - 10:42 Taken: Start Time: 10:42 Pain Control: Lidocaine 5% topical ointment T Area Debrided (L x W): otal 0.3 (cm) x 0.9 (cm) = 0.27 (cm) Tissue and other material debrided: Non-Viable, Callus, Slough, Slough Level: Non-Viable Tissue Debridement Description: Selective/Open Wound Instrument: Curette Bleeding: Minimum Hemostasis Achieved: Pressure Response to Treatment: Procedure was tolerated well Level of Consciousness (Post- Awake and Alert procedure): Post Debridement Measurements of Total Wound Length: (cm)  0.3 Width: (cm) 0.9 Depth: (cm) 2.4 Volume: (cm) 0.509 Character of Wound/Ulcer Post Debridement: Improved Severity of Tissue Post Debridement: Fat layer exposed Post Procedure Diagnosis Same as Pre-procedure Notes scribed for Dr. Heber Medora by Adline Peals, RN Electronic Signature(s) Signed: 04/07/2022 12:10:44 PM By: [name] DO Signed: 04/07/2022 4:54:15 PM By: Adline Peals Entered By: Adline Peals on 04/07/2022 10:42:53 -------------------------------------------------------------------------------- HPI Details Patient Name: Date of Service: [name], [name] 04/07/2022 10:00 [name] Patient Account Number: [id number] Date of Birth/Sex: Treating RN: 1970-01-23 (52 y.o. 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 The most recent amputation was in August 2022. At that operation, antibiotic beads were placed in the wound. He has been managed by podiatry for his procedures and management of his wounds. He has been in a Water engineer. He is on oral doxycycline. They have been using Betadine wet to dry dressings along with Iodosorb. The patient states that when he thinks the wound is getting too dry, he applies topical Neosporin. At his last visit, on June [name], Meer [id number] ([id number]) [id number] Page 3 of 11 7 of this year, the podiatrist determined that he felt the wound was stalled and referred him to wound care for additional evaluation and management. An MRI has been ordered, but not yet scheduled or performed. Pathology from his operation in  August 2022 demonstrated findings consistent with chronic osteomyelitis. Today, there is a large irregular wound on the plantar surface of his right foot, at about the level of the fifth metatarsal head. This tracks through to a pinpoint opening on the dorsal lateral portion of his foot. The intake nurse reported purulent drainage. There is some malodor from the wound. No frank necrosis identified. 07/11/2021: Today, the wounds do not connect. I attempted multiple times from various directions and the shared tunnel is no longer open. He has some slough accumulation on the dorsal part of his foot as well as slough and callus buildup on the plantar surface. His MRI is scheduled for June 29. No purulent drainage or malodor appreciated today. 07/19/2021: The lateral foot wound has closed and there is no tunnel connecting the plantar foot wound to that site. The plantar foot wound still probes quite deeply, however. There is some slough, eschar, and nonviable tissue accumulated in the wound bed. No malodorous drainage present. His MRI was performed yesterday and is consistent with fairly extensive osteomyelitis. 07/29/2021: The patient has an appointment with infectious disease on July 18 to treat his osteomyelitis. The plantar wound still probes quite deeply, approaching bone. The orifice has narrowed quite substantially, however, making it more difficult for him to pack. 08/05/2021: The tunnel connecting the lateral foot wound to the plantar foot wound has reopened. He sees infectious disease tomorrow to discuss long-term antibiotic treatment for osteomyelitis. There was a bit of murky drainage in the wound, but this was noted after he had had topical lidocaine applied so may have just been a blob of the anesthetic. No odor or frank purulent drainage. The wound probes deeply at the midfoot approaching bone. He does have MRI results confirming his diagnosis of osteomyelitis. He has failed to progress with  conventional treatment and I think his best chance for preservation of the foot is to initiate hyperbaric oxygen therapy. 08/13/2021: The tunnel connecting the 2 wounds has closed again. He has a PICC line and is getting IV daptomycin and cefepime. EKG and chest x-ray are within normal limits. He is scheduled to start hyperbaric oxygen therapy tomorrow. The wound in his midfoot probes deeply, approaching bone. The skin at the orifice continues to heaped up and threatens to close over despite the large cavity within. No erythema, induration, or purulent drainage. The wound on his lateral foot is fairly  small and quite clean. 08/19/2021: The lateral foot wound has nearly closed. The wound in his midfoot does not probe quite as deeply today. The skin at the orifice continues to try to roll in and obscure the opening. No frankly necrotic tissue appreciated. He is tolerating hyperbaric oxygen therapy. 08/26/2021: The lateral foot wound has closed completely. The wound in his midfoot is shallower again today. The wound orifice is contracting. We are using gentamicin and silver alginate. He continues on IV daptomycin and cefepime and is tolerating hyperbaric oxygen therapy without difficulty. 09/02/2021: His foot is a little bit sore today but he was up walking on it all weekend doing back to school shopping with his daughter. The tunneling is down to about 3 cm and I do not appreciate bone at the tip of the probe. The wound is clean but has some callus creating an overhanging lip at the distal aspect. He continues to receive hyperbaric oxygen therapy as well as IV daptomycin and cefepime. 09/09/2021: His wound continues to improve. The tunnel has come in by 0.9 cm. He continues to form callus around the orifice of the wound. He is tolerating hyperbaric oxygen therapy along with his IV antibiotics. 09/16/2021: The tunneling is down to just half a centimeter. He continues to build up callus around the orifice of the  wound. He completed his course of IV antibiotics and his PICC line is scheduled to be removed this Friday. He is tolerating hyperbaric oxygen without difficulty. We have been applying topical gentamicin and silver alginate to his wound. 09/24/2021: The wound depth has come in even further. There is a little bit of callus buildup around the orifice. The opening is quite narrow, at this point. 09/30/2021: The wound depth is about the same this week. The opening continues to contract with callus accumulation. There is some discoloration of the skin on the lateral part of his foot and he admitted to walking more than usual this weekend and also wearing a different pair shoes. He continues to tolerate hyperbaric oxygen therapy without difficulty. 10/07/2021: The wound depth has contracted a bit and is now approximately 1 to 1.5 cm. The orifice of the wound continues to try and close in over the space. There is just some callus and skin around the margins obscuring the orifice. 10/14/2021: The wound depth has come in a little bit more. The orifice of the wound is rolling inwards with epithelium and callus, making it difficult to pack the wound. 10/21/2021: There is still 1 portion of the wound, at the lateral aspect, that is still a couple of centimeters deep. The architecture of the wound makes this somewhat challenging to access and pack. Everything else looks clean. He has his usual accumulation of callus and skin, narrowing the orifice. 10/29/2021: No significant change to the depth of the wound. The orifice continues to contract with callus and skin narrowing the opening. His wife has been packing the wound and doing an excellent job. 11/05/2021: The depth of the wound has come in by about a centimeter. There is less callus accumulation around the orificedespite this, the wound is narrower today. We are still awaiting insurance approval for Kerecis powder. 11/12/2021: The depth of the wound continues to  contract. He has accumulated some callus around the orifice, as usual. We have received a verbal confirmation that he is approved for Kerecis, but no formal paperwork has yet been received. 11/19/2021: The depth continues to come in. There is callus accumulation around the orifice, as  usual. He has been formally approved for Kerecis, but unfortunately we do not have a billing mechanism for the powder in [employer] yet. We are working to address this so we can begin treatment. 11/25/2021: Continued filling in of the depth of the wound. Continued buildup of callus around the orifice. No concern for infection. As we do not have a way to bill for Kerecis powder, but are capable of billing for the [facility] [address] sheet, we are going to use that on him instead. 12/03/2021: The depth of the wound continues to fill in. As usual, he has accumulated callus and skin around the orifice of the wound. No concern for infection. He will complete his hyperbaric oxygen therapy this week. 12/17/2021: The wound depth continues to contract. There is callus and skin accumulation around the orifice of the wound, as per usual. There is more epithelium on the actual wound surface. Unfortunately, his Leroy Kennedy was not ordered. 12/24/2021: The wound depth has come in a little bit more this week. There is less skin accumulation around the orifice, but still some callus. On questioning, he admits to spending a fair amount of time on his feet. 12/12; this is a very difficult wound to assess size slitlike wound with some depth but minimal opening. I applied Kerecis No. 4 12/18; Kerecis No. 5 01/15/2022: His wound measured deeper today. There is also some evidence of pressure induced tissue injury around the lateral aspect of his foot. The patient reports that he was wearing different shoes than usual and felt like his foot was rolling a lot over the weekend. There is heavy callus around the orifice of the [name], Braxden [name] ([id number])  [id number] Page 4 of 11 wound. No concern for infection. 01/21/2022: The depth decreased today from 1.7 to 1.0 cm. Significantly less tissue damage as compared to last week. Still with callus and skin accumulation around the orifice. 01/29/2022: The wound continues to contract and the depth is coming in further. We are still working on getting [employer] for him. 02/04/2022: No real change this week. Still working on the [employer]. 02/11/2022: No significant changes again. We are still working on the [employer]. 02/18/2022: No change to the wound. We have submitted in IVR for [employer] [place]. 02/25/2022: Unfortunately, his wound has deteriorated over the past week. It is deeper and is approaching bone once again. There is more slough accumulation and the tissue shows signs of pressure injury. He says that he has been trying to wear his new work boots, which are waterproof. He says they make his feet sweat excessively. I think our main challenge with him is that we just are not able to adequately offload him while he is still working and on his feet through much of the day. He is willing to consider total contact casting, but will need to make some arrangements with work. On a more positive note, however, he has been approved for Epicord. 03/03/2022: The wound probed a little bit deeper again today. We are planning to put him in a total contact cast as soon as he has cleared it with work to take some time off. 03/10/2022: The wound was a little bit shallower today and the overall appearance is significantly improved. We are planning to put him in a cast today. 03/12/2022: He is here for his first total contact cast change. He denies any issues with pressure, rubbing, or pain. 03/17/2022: For some reason, the wound probes deeper today. There is some slough and nonviable subcutaneous tissue around  the opening. He denies any difficulty with the total contact cast. 03/24/2022:  The wound is shallower today. There is better granulation tissue visible on the wound surface. 03/31/2022: His cast protector got a hole in it and his cast got wet. His foot is macerated. The wound measured a little deeper today. There is some hypertrophic granulation tissue at the orifice of his wound. 3/18; patient presents for follow-up. He had no issues with the total contact cast. We have been using epi cord to the wound bed. Overall wound is stable. Patient denies signs of infection. Electronic Signature(s) Signed: 04/07/2022 12:10:44 PM By: [name] DO Entered By: [name] on 04/07/2022 10:49:02 -------------------------------------------------------------------------------- Physical Exam Details Patient Name: Date of Service: [name], [name] 04/07/2022 10:00 [name] Patient Account Number: [id number] Date of Birth/Sex: Treating RN: 1970/06/13 (52 y.o. [name] Primary Care Provider: [name] Other Clinician: Referring Provider: Treating Provider/Extender: [name] in Treatment: 39 Constitutional respirations regular, non-labored and within target range for patient.. Cardiovascular 2+ dorsalis pedis/posterior tibialis pulses. Psychiatric pleasant and cooperative. Notes Open wound to the plantar aspect of the right foot with increased depth. No probing to bone. Granulation tissue at the opening. No signs of infection. Electronic Signature(s) Signed: 04/07/2022 12:10:44 PM By: [name] DO Entered By: [name] on 04/07/2022 10:49:44 -------------------------------------------------------------------------------- Physician Orders Details Patient Name: Date of Service: [name], [name] 04/07/2022 10:00 [name] Patient Account Number: [id number] Date of Birth/Sex: Treating RN: [birth year] (52 y.o. [name] Primary Care Provider: [name]  Other Clinician: Referring Provider: Treating Provider/Extender: [name] in Treatment: 39 [name], [address] ([id number]) [id number] Page 5 of 11 Verbal / Phone Orders: No Diagnosis Coding Follow-up Appointments ppointment in 1 week. - Dr Celine Ahr RM 1 Return A Anesthetic Wound #2 Right,Lateral,Plantar Foot (In clinic) Topical Lidocaine 5% applied to wound bed Cellular or Tissue Based Products Cellular or Tissue Based Product Type: - Epicord #7 Cellular or Tissue Based Product applied to wound bed, secured with steri-strips, cover with Adaptic or Mepitel. (DO NOT REMOVE). Bathing/ Shower/ Hygiene May shower with protection but do not get wound dressing(s) wet. Protect dressing(s) with water repellant cover (for example, large plastic bag) or a cast cover and may then take shower. Off-Loading Total Contact Cast to Right Lower Extremity Other: - Try to not stand on your feet too much, minimize walking Wound Treatment Wound #2 - Foot Wound Laterality: Plantar, Right, Lateral Cleanser: Soap and Water 1 x Per Week/30 Days Discharge Instructions: May shower and wash wound with dial antibacterial soap and water prior to dressing change. Cleanser: Wound Cleanser (Generic) 1 x Per Week/30 Days Discharge Instructions: Cleanse the wound with wound cleanser prior to applying a clean dressing using gauze sponges, not tissue or cotton balls. Peri-Wound Care: Zinc Oxide Ointment 30g tube 1 x Per Week/30 Days Discharge Instructions: Apply Zinc Oxide to periwound with each dressing change Peri-Wound Care: cotton tipped applicators (Generic) 1 x Per Week/30 Days Prim Dressing: Promogran Prisma Matrix, 4.34 (sq in) (silver collagen) 1 x Per Week/30 Days ary Discharge Instructions: Moisten collagen with saline or hydrogel Prim Dressing: Epicord ary 1 x Per Week/30 Days Secondary Dressing: ADAPTIC TOUCH 3x4.25 in 1 x Per Week/30 Days Discharge  Instructions: Apply over primary dressing as directed. Secondary Dressing: Optifoam Non-Adhesive Dressing, 4x4 in 1 x Per Week/30 Days Discharge Instructions: Apply over primary dressing as directed. Secondary Dressing: Woven  Gauze Sponges 2x2 in 1 x Per Week/30 Days Discharge Instructions: moistened with saline. Apply over primary dressing as directed. Secured With: Paper Tape, 2x10 (in/yd) (Generic) 1 x Per Week/30 Days Discharge Instructions: Secure dressing with tape as directed. Compression Wrap: Kerlix Roll 4.5x3.1 (in/yd) (Generic) 1 x Per Week/30 Days Discharge Instructions: Apply Kerlix and Coban compression as directed. Patient Medications llergies: No Known Allergies A Notifications Medication Indication Start End 04/07/2022 lidocaine DOSE topical 5 % ointment - ointment topical Electronic Signature(s) Signed: 04/07/2022 12:10:44 PM By: [name] DO Entered By: [name] on 04/07/2022 10:49:55 Problem List Details -------------------------------------------------------------------------------- [name], Winfred [name] ([id number]) [id number] Page 6 of 11 Patient Name: Date of Service: [name], [name] 04/07/2022 10:00 [name] Patient Account Number: [id number] Date of Birth/Sex: Treating RN: 1970/02/19 (52 y.o. 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 04/07/2022 10:00 [name] Patient Account Number: [id number] Date of Birth/Sex: Treating RN: 1970/09/22 (52 y.o. 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 The most recent amputation was in August 2022. At that operation, antibiotic beads were placed in the wound. He has been managed by podiatry for his procedures and management of his wounds. He has been in a Water engineer. He is on oral doxycycline. They have been using Betadine wet to dry dressings along with Iodosorb. The patient states that when he thinks the wound is getting too dry, he applies topical Neosporin. At his last visit, on June 7 of this year, the podiatrist determined that he felt the wound was stalled and referred him to wound care for additional evaluation and management. An MRI has been ordered, but not yet scheduled or performed. Pathology from his operation in August 2022 demonstrated findings consistent with chronic osteomyelitis. Today, there is a large irregular wound on the plantar surface of his right foot, at about the level of the fifth metatarsal head. This tracks through to a pinpoint opening on the dorsal lateral portion of his foot. The intake nurse reported purulent drainage. There is  some malodor from the wound. No frank necrosis identified. 07/11/2021: Today, the wounds do not connect. I attempted multiple times from various directions and the shared tunnel is no longer open. He has some slough accumulation on the dorsal part of his foot as well as slough and callus buildup on the plantar surface. His MRI is scheduled for June 29. No purulent drainage or malodor appreciated today. 07/19/2021: The lateral foot wound has closed and there is no  tunnel connecting the plantar foot wound to that site. The plantar foot wound still probes quite deeply, however. There is some slough, eschar, and nonviable tissue accumulated in the wound bed. No malodorous drainage present. His MRI was performed yesterday and is consistent with fairly extensive osteomyelitis. 07/29/2021: The patient has an appointment with infectious disease on July 18 to treat his osteomyelitis. The plantar wound still probes quite deeply, approaching bone. The orifice has narrowed quite substantially, however, making it more difficult for him to pack. 08/05/2021: The tunnel connecting the lateral foot wound to the plantar foot wound has reopened. He sees infectious disease tomorrow to discuss long-term [name], Dion [name] ([id number]) [id number] Page 7 of 11 antibiotic treatment for osteomyelitis. There was a bit of murky drainage in the wound, but this was noted after he had had topical lidocaine applied so may have just been a blob of the anesthetic. No odor or frank purulent drainage. The wound probes deeply at the midfoot approaching bone. He does have MRI results confirming his diagnosis of osteomyelitis. He has failed to progress with conventional treatment and I think his best chance for preservation of the foot is to initiate hyperbaric oxygen therapy. 08/13/2021: The tunnel connecting the 2 wounds has closed again. He has a PICC line and is getting IV daptomycin and cefepime. EKG and chest  x-ray are within normal limits. He is scheduled to start hyperbaric oxygen therapy tomorrow. The wound in his midfoot probes deeply, approaching bone. The skin at the orifice continues to heaped up and threatens to close over despite the large cavity within. No erythema, induration, or purulent drainage. The wound on his lateral foot is fairly small and quite clean. 08/19/2021: The lateral foot wound has nearly closed. The wound in his midfoot does not probe quite as deeply today. The skin at the orifice continues to try to roll in and obscure the opening. No frankly necrotic tissue appreciated. He is tolerating hyperbaric oxygen therapy. 08/26/2021: The lateral foot wound has closed completely. The wound in his midfoot is shallower again today. The wound orifice is contracting. We are using gentamicin and silver alginate. He continues on IV daptomycin and cefepime and is tolerating hyperbaric oxygen therapy without difficulty. 09/02/2021: His foot is a little bit sore today but he was up walking on it all weekend doing back to school shopping with his daughter. The tunneling is down to about 3 cm and I do not appreciate bone at the tip of the probe. The wound is clean but has some callus creating an overhanging lip at the distal aspect. He continues to receive hyperbaric oxygen therapy as well as IV daptomycin and cefepime. 09/09/2021: His wound continues to improve. The tunnel has come in by 0.9 cm. He continues to form callus around the orifice of the wound. He is tolerating hyperbaric oxygen therapy along with his IV antibiotics. 09/16/2021: The tunneling is down to just half a centimeter. He continues to build up callus around the orifice of the wound. He completed his course of IV antibiotics and his PICC line is scheduled to be removed this Friday. He is tolerating hyperbaric oxygen without difficulty. We have been applying topical gentamicin and silver alginate to his wound. 09/24/2021: The wound  depth has come in even further. There is a little bit of callus buildup around the orifice. The opening is quite narrow, at this point. 09/30/2021: The wound depth is about the same this week. The opening continues to contract with callus accumulation. There is  some discoloration of the skin on the lateral part of his foot and he admitted to walking more than usual this weekend and also wearing a different pair shoes. He continues to tolerate hyperbaric oxygen therapy without difficulty. 10/07/2021: The wound depth has contracted a bit and is now approximately 1 to 1.5 cm. The orifice of the wound continues to try and close in over the space. There is just some callus and skin around the margins obscuring the orifice. 10/14/2021: The wound depth has come in a little bit more. The orifice of the wound is rolling inwards with epithelium and callus, making it difficult to pack the wound. 10/21/2021: There is still 1 portion of the wound, at the lateral aspect, that is still a couple of centimeters deep. The architecture of the wound makes this somewhat challenging to access and pack. Everything else looks clean. He has his usual accumulation of callus and skin, narrowing the orifice. 10/29/2021: No significant change to the depth of the wound. The orifice continues to contract with callus and skin narrowing the opening. His wife has been packing the wound and doing an excellent job. 11/05/2021: The depth of the wound has come in by about a centimeter. There is less callus accumulation around the orificeoodespite this, the wound is narrower today. We are still awaiting insurance approval for Kerecis powder. 11/12/2021: The depth of the wound continues to contract. He has accumulated some callus around the orifice, as usual. We have received a verbal confirmation that he is approved for Kerecis, but no formal paperwork has yet been received. 11/19/2021: The depth continues to come in. There is callus  accumulation around the orifice, as usual. He has been formally approved for Kerecis, but unfortunately we do not have a billing mechanism for the powder in [facility] yet. We are working to address this so we can begin treatment. 11/25/2021: Continued filling in of the depth of the wound. Continued buildup of callus around the orifice. No concern for infection. As we do not have a way to bill for Kerecis powder, but are capable of billing for the [facility] sheet, we are going to use that on him instead. 12/03/2021: The depth of the wound continues to fill in. As usual, he has accumulated callus and skin around the orifice of the wound. No concern for infection. He will complete his hyperbaric oxygen therapy this week. 12/17/2021: The wound depth continues to contract. There is callus and skin accumulation around the orifice of the wound, as per usual. There is more epithelium on the actual wound surface. Unfortunately, his Leroy Kennedy was not ordered. 12/24/2021: The wound depth has come in a little bit more this week. There is less skin accumulation around the orifice, but still some callus. On questioning, he admits to spending a fair amount of time on his feet. 12/12; this is a very difficult wound to assess size slitlike wound with some depth but minimal opening. I applied Kerecis No. 4 12/18; Kerecis No. 5 01/15/2022: His wound measured deeper today. There is also some evidence of pressure induced tissue injury around the lateral aspect of his foot. The patient reports that he was wearing different shoes than usual and felt like his foot was rolling a lot over the weekend. There is heavy callus around the orifice of the wound. No concern for infection. 01/21/2022: The depth decreased today from 1.7 to 1.0 cm. Significantly less tissue damage as compared to last week. Still with callus and skin accumulation around the orifice. 01/29/2022:  The wound continues to contract and the depth is coming in further. We  are still working on getting [employer] for him. 02/04/2022: No real change this week. Still working on the [employer]. 02/11/2022: No significant changes again. We are still working on the [employer]. 02/18/2022: No change to the wound. We have submitted in IVR for [place]. 02/25/2022: Unfortunately, his wound has deteriorated over the past week. It is deeper and is approaching bone once again. There is more slough accumulation and the tissue shows signs of pressure injury. He says that he has been trying to wear his new work boots, which are waterproof. He says they make his feet sweat excessively. I think our main challenge with him is that we just are not able to adequately offload him while he is still working and on his feet through much of the day. He is willing to consider total contact casting, but will need to make some arrangements with work. On a more positive note, however, he has been approved for Epicord. 03/03/2022: The wound probed a little bit deeper again today. We are planning to put him in a total contact cast as soon as he has cleared it with work to take [name], Juda [name] ([id number]) [id number] Page 8 of 11 some time off. 03/10/2022: The wound was a little bit shallower today and the overall appearance is significantly improved. We are planning to put him in a cast today. 03/12/2022: He is here for his first total contact cast change. He denies any issues with pressure, rubbing, or pain. 03/17/2022: For some reason, the wound probes deeper today. There is some slough and nonviable subcutaneous tissue around the opening. He denies any difficulty with the total contact cast. 03/24/2022: The wound is shallower today. There is better granulation tissue visible on the wound surface. 03/31/2022: His cast protector got a hole in it and his cast got wet. His foot is macerated. The wound measured a little deeper today. There is some  hypertrophic granulation tissue at the orifice of his wound. 3/18; patient presents for follow-up. He had no issues with the total contact cast. We have been using epi cord to the wound bed. Overall wound is stable. Patient denies signs of infection. Patient History Information obtained from Patient. Social History Never smoker, Marital Status - Married, Alcohol Use - Never, Drug Use - No History, Caffeine Use - Daily - T coffee. ea; Medical History Endocrine Patient has history of Type II Diabetes Hospitalization/Surgery History - Amuptation of 3rd,4th and 5th toes of Right foot;Oral Surgery;Anal Fissure surgery; Cholecystectomy. Objective Constitutional respirations regular, non-labored and within target range for patient.. Vitals Time Taken: 10:10 AM, Height: 72 in, Weight: 312 lbs, BMI: 42.3, Temperature: 97.3 F, Pulse: 77 bpm, Respiratory Rate: 18 breaths/min, Blood Pressure: 163/99 mmHg, Capillary Blood Glucose: 177 mg/dl. Cardiovascular 2+ dorsalis pedis/posterior tibialis pulses. Psychiatric pleasant and cooperative. General Notes: Open wound to the plantar aspect of the right foot with increased depth. No probing to bone. Granulation tissue at the opening. No signs of infection. Integumentary (Hair, Skin) Wound #2 status is Open. Original cause of wound was Gradually Appeared. The date acquired was: 12/19/2016. The wound is located on the [place]. The wound measures 0.3cm length x 0.9cm width x 2.4cm depth; 0.212cm^2 area and 0.509cm^3 volume. There is Fat Layer (Subcutaneous Tissue) exposed. There is no tunneling or undermining noted. There is a medium amount of serosanguineous drainage noted. The wound margin is thickened. There is large (67-100%) pink granulation  within the wound bed. There is a small (1-33%) amount of necrotic tissue within the wound bed. The periwound skin appearance had no abnormalities noted for color. The periwound skin appearance  exhibited: Callus, Maceration. Periwound temperature was noted as No Abnormality. Wound #2 status is Open. Original cause of wound was Gradually Appeared. The date acquired was: 12/19/2016. The wound has been in treatment 39 weeks. The wound is located on the [place]. The wound measures 0.3cm length x 0.9cm width x 2.4cm depth; 0.212cm^2 area and 0.509cm^3 volume. There is Fat Layer (Subcutaneous Tissue) exposed. There is no tunneling or undermining noted. There is a medium amount of serosanguineous drainage noted. The wound margin is thickened. There is large (67-100%) pink granulation within the wound bed. There is a small (1-33%) amount of necrotic tissue within the wound bed including Adherent Slough. The periwound skin appearance had no abnormalities noted for color. The periwound skin appearance exhibited: Callus, Maceration. Periwound temperature was noted as No Abnormality. 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 I debrided nonviable tissue. No signs of infection. Epicord was placed in standard fashion. We will continue the total contact cast. Follow-up in 1 week. Procedures Wound #2 Pre-procedure diagnosis of Wound #2 is a Diabetic Wound/Ulcer of the Lower Extremity located on the Right,Lateral,Plantar Foot .Severity of Tissue Pre Debridement is: Fat layer exposed. There was a Selective/Open Wound Non-Viable Tissue Debridement with a total area of 0.27 sq cm performed by [name], DO. With the following instrument(s): Curette to remove Non-Viable tissue/material. Material removed includes Callus and Slough and after achieving pain control using Lidocaine 5% topical  ointment. No specimens were taken. A time out was conducted at 10:42, prior to the start of the procedure. A Minimum amount of bleeding was controlled with Pressure. The procedure was tolerated well. Post Debridement Measurements: 0.3cm length x 0.9cm width x 2.4cm depth; 0.509cm^3 volume. Character of Wound/Ulcer Post Debridement is improved. Severity of Tissue Post Debridement is: Fat layer exposed. Post procedure Diagnosis Wound #2: Same as Pre-Procedure General Notes: scribed for Dr. Heber [place] by Adline Peals, RN. Pre-procedure diagnosis of Wound #2 is a Diabetic Wound/Ulcer of the Lower Extremity located on the Right,Lateral,Plantar Foot. A skin graft procedure using a bioengineered skin substitute/cellular or tissue based product was performed by [name], DO with the following instrument(s): Curette. Epicord was applied and secured with Steri-Strips. 6 sq cm of product was utilized and 0 sq cm was wasted. Post Application, adaptic was applied. A Time Out was conducted at 10:42, prior to the start of the procedure. The procedure was tolerated well with a pain level of 0 throughout and a pain level of 0 following the procedure. Post procedure Diagnosis Wound #2: Same as Pre-Procedure General Notes: scribed for Dr. Heber [place] by Adline Peals, RN. Pre-procedure diagnosis of Wound #2 is a Diabetic Wound/Ulcer of the Lower Extremity located on the Right,Lateral,Plantar Foot . There was a T Interior and spatial designer Procedure by [name], DO. Post procedure Diagnosis Wound #2: Same as Pre-Procedure Notes: scribed for Dr. Heber [place] by Adline Peals, RN. Plan Follow-up Appointments: Return Appointment in 1 week. - Dr Celine Ahr RM 1 Anesthetic: Wound #2 Right,Lateral,Plantar Foot: (In clinic) Topical Lidocaine 5% applied to wound bed Cellular or Tissue Based Products: Cellular or Tissue Based  Product Type: - Epicord #7 Cellular or Tissue Based Product applied to wound bed, secured  with steri-strips, cover with Adaptic or Mepitel. (DO NOT REMOVE). Bathing/ Shower/ Hygiene: May shower with protection but do not get wound dressing(s) wet. Protect dressing(s) with water repellant cover (for example, large plastic bag) or a cast cover and may then take shower. Off-Loading: T Contact Cast to Right Lower Extremity otal Other: - Try to not stand on your feet too much, minimize walking The following medication(s) was prescribed: lidocaine topical 5 % ointment ointment topical was prescribed at facility WOUND #2: - Foot Wound Laterality: Plantar, Right, Lateral Cleanser: Soap and Water 1 x Per Week/30 Days Discharge Instructions: May shower and wash wound with dial antibacterial soap and water prior to dressing change. Cleanser: Wound Cleanser (Generic) 1 x Per Week/30 Days Discharge Instructions: Cleanse the wound with wound cleanser prior to applying a clean dressing using gauze sponges, not tissue or cotton balls. Peri-Wound Care: Zinc Oxide Ointment 30g tube 1 x Per Week/30 Days Discharge Instructions: Apply Zinc Oxide to periwound with each dressing change Peri-Wound Care: cotton tipped applicators (Generic) 1 x Per Week/30 Days Prim Dressing: Promogran Prisma Matrix, 4.34 (sq in) (silver collagen) 1 x Per Week/30 Days ary Discharge Instructions: Moisten collagen with saline or hydrogel Prim Dressing: Epicord 1 x Per Week/30 Days ary Secondary Dressing: ADAPTIC TOUCH 3x4.25 in 1 x Per Week/30 Days Discharge Instructions: Apply over primary dressing as directed. Secondary Dressing: Optifoam Non-Adhesive Dressing, 4x4 in 1 x Per Week/30 Days Discharge Instructions: Apply over primary dressing as directed. Secondary Dressing: Woven Gauze Sponges 2x2 in 1 x Per Week/30 Days Discharge Instructions: moistened with saline. Apply over primary dressing as directed. Secured With: Paper T ape, 2x10 (in/yd) (Generic) 1 x Per Week/30 Days Discharge Instructions: Secure dressing  with tape as directed. Com pression Wrap: Kerlix Roll 4.5x3.1 (in/yd) (Generic) 1 x Per Week/30 Days Discharge Instructions: Apply Kerlix and Coban compression as directed. 1. In office sharp debridement 2. Epicord placed in standard fashion 3. T contact cast placed in standard fashion otal 4. Follow-up in 1 week Electronic Signature(s) Signed: 04/07/2022 12:10:44 PM By: [name] DO Entered By: [name] on 04/07/2022 10:51:53 [name], [name] ([id number]) [id number] Page 10 of 11 -------------------------------------------------------------------------------- HxROS Details Patient Name: Date of Service: [name], [name] 04/07/2022 10:00 [name] Patient Account Number: [id number] Date of Birth/Sex: Treating RN: [birth year] (52 y.o. [name] Primary Care Provider: [name] Other Clinician: Referring Provider: Treating Provider/Extender: [name] in Treatment: 39 Information Obtained From Patient Endocrine Medical History: Positive for: Type II Diabetes Immunizations Pneumococcal Vaccine: Received Pneumococcal Vaccination: Yes Received Pneumococcal Vaccination On or After 60th Birthday: No Implantable Devices None Hospitalization / Surgery History Type of Hospitalization/Surgery Amuptation of 3rd,4th and 5th toes of Right foot;Oral Surgery;Anal Fissure surgery; Cholecystectomy Family and Social History Never smoker; Marital Status - Married; Alcohol Use: Never; Drug Use: No History; Caffeine Use: Daily - T coffee; Financial Concerns: No; Food, ea; Clothing or Shelter Needs: No; Support System Lacking: No; Transportation Concerns: No Electronic Signature(s) Signed: 04/07/2022 12:10:44 PM By: [name] DO Entered By: [name] on 04/07/2022 10:49:07 -------------------------------------------------------------------------------- Total Contact Cast Details Patient  Name: Date of Service: [name], [name] 04/07/2022 10:00 [name] Patient Account Number: [id number] Date of Birth/Sex: Treating RN: [birth year] (52 y.o. [name] Primary Care Provider: [name] Other Clinician: Referring Provider: Treating Provider/Extender: Heber [place]  Maisie Fus, Alexis Goodell in Treatment: 39 T Contact Cast Applied for Wound Assessment: otal Wound #2 Right,Lateral,Plantar Foot Performed By: Physician [name], DO Post Procedure Diagnosis Same as Pre-procedure Notes scribed for Dr. Heber [place] by Adline Peals, RN Electronic Signature(s) Signed: 04/07/2022 12:10:44 PM By: [name] DO Signed: 04/07/2022 4:54:15 PM By: Adline Peals Entered By: Adline Peals on 04/07/2022 10:42:15 -------------------------------------------------------------------------------- SuperBill Details Patient Name: Date of Service: [name], [name] 04/07/2022 [name], Woodson [name] ([id number]) [id number] Page 11 of 11 Medical Record Number: [id number] Patient Account Number: [id number] Date of Birth/Sex: Treating RN: 02-22-1970 (52 y.o. [name] Primary Care Provider: [name] Other Clinician: Referring Provider: Treating Provider/Extender: [name] in Treatment: 39 Diagnosis Coding ICD-10 Codes Code Description [phone] Non-pressure chronic ulcer of other part of right foot with necrosis of bone E11.49 Type 2 diabetes mellitus with other diabetic neurological complication [id number] Other chronic osteomyelitis, right ankle and foot Facility Procedures : CPT4 Code: [id number] Description: [id number] Epicord 2cm x 3cm - per sqcm Modifier: Quantity: 6 : CPT4 Code: [id number] Description: [id number] - SKIN SUB GRAFT FACE/NK/HF/G ICD-10 Diagnosis Description L97.514 Non-pressure chronic ulcer of other part of right foot with necrosis of bone E11.49 Type 2 diabetes mellitus  with other diabetic neurological complication Modifier: Quantity: 1 Physician Procedures : CPT4 Code Description Modifier [id number] - WC PHYS SKIN SUB GRAFT FACE/NK/HF/G ICD-10 Diagnosis Description L97.514 Non-pressure chronic ulcer of other part of right foot with necrosis of bone E11.49 Type 2 diabetes mellitus with other diabetic  neurological complication Quantity: 1 Electronic Signature(s) Signed: 04/07/2022 12:10:44 PM By: [name] DO Entered By: [name] on 04/07/2022 10:52:10

## 2022-04-14 ENCOUNTER — Encounter (HOSPITAL_BASED_OUTPATIENT_CLINIC_OR_DEPARTMENT_OTHER): Payer: BLUE CROSS/BLUE SHIELD | Admitting: General Surgery

## 2022-04-14 DIAGNOSIS — E11621 Type 2 diabetes mellitus with foot ulcer: Secondary | ICD-10-CM | POA: Diagnosis not present

## 2022-04-14 NOTE — Progress Notes (Signed)
[name], [place] [id number] ([id number]) [id number] Page 1 of 7 Visit Report for 04/07/2022 Arrival Information Details Patient Name: Date of Service: [place], [place] [name] 04/07/2022 10:00 A M Medical Record Number: [id number] Patient Account Number: [id number] Date of Birth/Sex: Treating RN: [birth year] (52 y.o. M) Primary Care Danyell Shader: [name] Other Clinician: Referring Yossi Hinchman: Treating Addie Alonge/Extender: [name] in Treatment: 39 Visit Information History Since Last Visit Added or deleted any medications: No Patient Arrived: Ambulatory Any new allergies or adverse reactions: No Arrival Time: 10:10 Had a fall or experienced change in No Accompanied By: self activities of daily living that may affect Transfer Assistance: None risk of falls: Patient Identification Verified: Yes Signs or symptoms of abuse/neglect since last No Secondary Verification Process Completed: Yes visito Patient Requires Transmission-Based Precautions: No Hospitalized since last visit: No Patient Has Alerts: Yes Implantable device outside of the clinic No excluding cellular tissue based products placed in the center since last visit: Has Dressing in Place as Prescribed: Yes Has Footwear/Offloading in Place as Yes Prescribed: Right: Removable Cast Walker/Walking Boot T Contact Cast otal Pain Present Now: No Electronic Signature(s) Signed: 04/14/2022 4:17:02 PM By: Erenest Blank Entered By: Erenest Blank on 04/07/2022 10:10:49 -------------------------------------------------------------------------------- Encounter Discharge Information Details Patient Name: Date of Service: [name], [name] 04/07/2022 10:00 A M Medical Record Number: [id number] Patient Account Number: [id number] Date of Birth/Sex: Treating RN: [birth year] (52 y.o. [name] Primary Care Dagoberto Nealy: [name] Other Clinician: Referring Jazzie Trampe: Treating  Jesseca Marsch/Extender: [name] in Treatment: 39 Encounter Discharge Information Items Post Procedure Vitals Discharge Condition: Stable Temperature (F): 97.3 Ambulatory Status: Ambulatory Pulse (bpm): 77 Discharge Destination: Home Respiratory Rate (breaths/min): 18 Transportation: Private Auto Blood Pressure (mmHg): 163/99 Accompanied By: self Schedule Follow-up Appointment: Yes Clinical Summary of Care: Patient Declined Electronic Signature(s) Signed: 04/07/2022 4:54:15 PM By: Adline Peals Entered By: Adline Peals on 04/07/2022 11:01:35 -------------------------------------------------------------------------------- Lower Extremity Assessment Details Patient Name: Date of Service: [name], [name] 04/07/2022 10:00 A M Medical Record Number: [id number] Patient Account Number: [id number] Date of Birth/Sex: Treating RN: 07/19/1970 (52 y.o. M) Primary Care [place] Flett: [name] Other Clinician: [name], Christ M ([id number]) [id number] Page 2 of 7 Referring Starlina Lapre: Treating Yogi Arther/Extender: [name] in Treatment: 39 Edema Assessment Assessed: [Left: No] [Right: No] Edema: [Left: N] [Right: o] Calf Left: Right: Point of Measurement: From Medial Instep 42.1 cm Ankle Left: Right: Point of Measurement: From Medial Instep 25 cm Vascular Assessment Pulses: Dorsalis Pedis Palpable: [Right:Yes] Electronic Signature(s) Signed: 04/14/2022 4:17:02 PM By: Erenest Blank Entered By: Erenest Blank on 04/07/2022 10:21:13 -------------------------------------------------------------------------------- Multi Wound Chart Details Patient Name: Date of Service: [name], [name] 04/07/2022 10:00 A M Medical Record Number: [id number] Patient Account Number: [id number] Date of Birth/Sex: Treating RN: Nov 15, 1970 (52 y.o. M) Primary Care Alexander Mcauley: [name] Other Clinician: Referring  Khrystian Schauf: Treating Kohle Winner/Extender: [name] in Treatment: 39 Vital Signs Height(in): 72 Capillary Blood Glucose(mg/dl): 177 Weight(lbs): 312 Pulse(bpm): 77 Body Mass Index(BMI): 42.3 Blood Pressure(mmHg): 163/99 Temperature(F): 97.3 Respiratory Rate(breaths/min): 18 [2:Photos: No Photos] [N/A:N/A] Right, Lateral, Plantar Foot Right, Lateral, Plantar Foot N/A Wound Location: Gradually Appeared Gradually Appeared N/A Wounding Event: Diabetic Wound/Ulcer of the Lower Diabetic Wound/Ulcer of the Lower N/A Primary Etiology: Extremity Extremity Type II Diabetes Type II Diabetes N/A Comorbid History: 12/19/2016 12/19/2016 N/A Date Acquired: 0 39 N/A Weeks of Treatment: Open Open N/A Wound Status: No No N/A Wound Recurrence: 0.3x0.9x2.4 0.3x0.9x2.4 N/A  Measurements L x W x D (cm) 0.212 0.212 N/A A (cm) : rea 0.509 0.509 N/A Volume (cm) : 94.10% 94.10% N/A % Reduction in A rea: 98.80% 97.20% N/A % Reduction in Volume: Grade 3 Grade 3 N/A Classification: Medium Medium N/A Exudate A mount: Serosanguineous Serosanguineous N/A Exudate Type: red, brown red, brown N/A Exudate Color: [name], Favor M ([id number]) [id number] Page 3 of 7 Thickened Thickened N/A Wound Margin: Large (67-100%) Large (67-100%) N/A Granulation Amount: Pink Pink N/A Granulation Quality: Small (1-33%) Small (1-33%) N/A Necrotic Amount: Fat Layer (Subcutaneous Tissue): Yes Fat Layer (Subcutaneous Tissue): Yes N/A Exposed Structures: Fascia: No Fascia: No Tendon: No Tendon: No Muscle: No Muscle: No Joint: No Joint: No Bone: No Bone: No Small (1-33%) Small (1-33%) N/A Epithelialization: N/A Debridement - Selective/Open Wound N/A Debridement: Pre-procedure Verification/Time Out N/A 10:42 N/A Taken: N/A Lidocaine 5% topical ointment N/A Pain Control: N/A Callus, Slough N/A Tissue Debrided: N/A Non-Viable Tissue  N/A Level: N/A 0.27 N/A Debridement A (sq cm): rea N/A Curette N/A Instrument: N/A Minimum N/A Bleeding: N/A Pressure N/A Hemostasis A chieved: N/A Procedure was tolerated well N/A Debridement Treatment Response: N/A 0.3x0.9x2.4 N/A Post Debridement Measurements L x W x D (cm) N/A 0.509 N/A Post Debridement Volume: (cm) Callus: Yes Callus: Yes N/A Periwound Skin Texture: Maceration: Yes Maceration: Yes N/A Periwound Skin Moisture: No Abnormalities Noted No Abnormalities Noted N/A Periwound Skin Color: No Abnormality No Abnormality N/A Temperature: N/A Cellular or Tissue Based Product N/A Procedures Performed: Debridement T Contact Cast otal Treatment Notes Electronic Signature(s) Signed: 04/07/2022 12:10:44 PM By: [name] DO Entered By: [name] on 04/07/2022 10:48:13 -------------------------------------------------------------------------------- Multi-Disciplinary Care Plan Details Patient Name: Date of Service: [name], [name] 04/07/2022 10:00 A M Medical Record Number: [id number] Patient Account Number: [id number] Date of Birth/Sex: Treating RN: 09/19/1970 (52 y.o. [name] Primary Care Merrissa Giacobbe: [name] Other Clinician: Referring Abeera Flannery: Treating [place] Scheiderer/Extender: [name] in Treatment: [place] reviewed with physician Active Inactive Nutrition Nursing Diagnoses: Impaired glucose control: actual or potential Potential for alteratiion in Nutrition/Potential for imbalanced nutrition Goals: Patient/caregiver will maintain therapeutic glucose control Date Initiated: 07/29/2021 Target Resolution Date: 04/18/2022 Goal Status: Active Interventions: Assess patient nutrition upon admission and as needed per policy Provide education on elevated blood sugars and impact on wound healing Treatment Activities: Patient referred to Primary Care Physician for further  nutritional evaluation : 07/29/2021 Notes: Wound/Skin Impairment [name], Marquise M ([id number]) [id number] Page 4 of 7 Nursing Diagnoses: Impaired tissue integrity Goals: Patient/caregiver will verbalize understanding of skin care regimen Date Initiated: 07/04/2021 Target Resolution Date: 05/23/2022 Goal Status: Active Interventions: Assess ulceration(s) every visit Treatment Activities: Skin care regimen initiated : 07/04/2021 Notes: Electronic Signature(s) Signed: 04/07/2022 4:54:15 PM By: Adline Peals Entered By: Adline Peals on 04/07/2022 11:00:34 -------------------------------------------------------------------------------- Pain Assessment Details Patient Name: Date of Service: [name], [name] 04/07/2022 10:00 A M Medical Record Number: [id number] Patient Account Number: [id number] Date of Birth/Sex: Treating RN: [birth year] (52 y.o. M) Primary Care Nhyla Nappi: [name] Other Clinician: Referring Eulalia Ellerman: Treating Creedon Danielski/Extender: [name] in Treatment: 39 Active Problems Location of Pain Severity and Description of Pain Patient Has Paino No Site Locations Pain Management and Medication Current Pain Management: Electronic Signature(s) Signed: 04/14/2022 4:17:02 PM By: Erenest Blank Entered By: Erenest Blank on 04/07/2022 10:20:41 -------------------------------------------------------------------------------- Patient/Caregiver Education Details Patient Name: Date of Service: [name], [name] 3/18/2024andnbsp10:00 [name] Record Number: [id number] Patient Account Number: [id number]  Date of Birth/Gender: Treating RN: 1970-10-11 (52 y.o. [name] Primary Care Physician: [name] Other Clinician: Referring Physician: Treating Physician/Extender: [name] in Treatment: 39 [name], [place] ([id number]) [phone].pdf Page 5  of 7 Education Assessment Education Provided To: Patient Education Topics Provided Wound/Skin Impairment: Methods: Explain/Verbal Responses: Reinforcements needed, State content correctly Electronic Signature(s) Signed: 04/07/2022 4:54:15 PM By: Adline Peals Entered By: Adline Peals on 04/07/2022 11:00:48 -------------------------------------------------------------------------------- Wound Assessment Details Patient Name: Date of Service: [name], [name] 04/07/2022 10:00 A M Medical Record Number: [id number] Patient Account Number: [id number] Date of Birth/Sex: Treating RN: [birth year] (52 y.o. [name] Primary Care Kadeisha Betsch: [name] Other Clinician: Referring Talma Aguillard: Treating Aydian Dimmick/Extender: [name] in Treatment: 39 Wound Status Wound Number: 2 Primary Etiology: Diabetic Wound/Ulcer of the Lower Extremity Wound Location: Right, Lateral, Plantar Foot Wound Status: Open Wounding Event: Gradually Appeared Comorbid History: Type II Diabetes Date Acquired: 12/19/2016 Weeks Of Treatment: 0 Clustered Wound: No Wound Measurements Length: (cm) 0.3 Width: (cm) 0.9 Depth: (cm) 2.4 Area: (cm) 0.212 Volume: (cm) 0.509 % Reduction in Area: 94.1% % Reduction in Volume: 98.8% Epithelialization: Small (1-33%) Tunneling: No Undermining: No Wound Description Classification: Grade 3 Wound Margin: Thickened Exudate Amount: Medium Exudate Type: Serosanguineous Exudate Color: red, brown Foul Odor After Cleansing: No Slough/Fibrino Yes Wound Bed Granulation Amount: Large (67-100%) Exposed Structure Granulation Quality: Pink Fascia Exposed: No Necrotic Amount: Small (1-33%) Fat Layer (Subcutaneous Tissue) Exposed: Yes Tendon Exposed: No Muscle Exposed: No Joint Exposed: No Bone Exposed: No Periwound Skin Texture Texture Color No Abnormalities Noted: No No Abnormalities Noted: Yes Callus: Yes Temperature /  Pain Temperature: No Abnormality Moisture No Abnormalities Noted: No Maceration: Yes Electronic Signature(s) Signed: 04/07/2022 4:54:15 PM By: Adline Peals Entered By: Adline Peals on 04/07/2022 10:36:35 [name], [place] M ([id number]) [id number] Page 6 of 7 -------------------------------------------------------------------------------- Wound Assessment Details Patient Name: Date of Service: [name], [name] 04/07/2022 10:00 A M Medical Record Number: [id number] Patient Account Number: [id number] Date of Birth/Sex: Treating RN: 06/15/1970 (52 y.o. M) Primary Care Jerrilynn Mikowski: [name] Other Clinician: Referring Jeannie Mallinger: Treating Doha Boling/Extender: [name] in Treatment: 39 Wound Status Wound Number: 2 Primary Etiology: Diabetic Wound/Ulcer of the Lower Extremity Wound Location: Right, Lateral, Plantar Foot Wound Status: Open Wounding Event: Gradually Appeared Comorbid History: Type II Diabetes Date Acquired: 12/19/2016 Weeks Of Treatment: 39 Clustered Wound: No Photos Wound Measurements Length: (cm) 0.3 Width: (cm) 0.9 Depth: (cm) 2.4 Area: (cm) 0.212 Volume: (cm) 0.509 % Reduction in Area: 94.1% % Reduction in Volume: 97.2% Epithelialization: Small (1-33%) Tunneling: No Undermining: No Wound Description Classification: Grade 3 Wound Margin: Thickened Exudate Amount: Medium Exudate Type: Serosanguineous Exudate Color: red, brown Foul Odor After Cleansing: No Slough/Fibrino Yes Wound Bed Granulation Amount: Large (67-100%) Exposed Structure Granulation Quality: Pink Fascia Exposed: No Necrotic Amount: Small (1-33%) Fat Layer (Subcutaneous Tissue) Exposed: Yes Necrotic Quality: Adherent Slough Tendon Exposed: No Muscle Exposed: No Joint Exposed: No Bone Exposed: No Periwound Skin Texture Texture Color No Abnormalities Noted: No No Abnormalities Noted: Yes Callus: Yes Temperature /  Pain Temperature: No Abnormality Moisture No Abnormalities Noted: No Maceration: Yes Electronic Signature(s) Signed: 04/07/2022 4:54:15 PM By: Adline Peals Entered By: Adline Peals on 04/07/2022 10:36:49 [name], Damani M ([id number]) [id number] Page 7 of 7 -------------------------------------------------------------------------------- Vitals Details Patient Name: Date of Service: [name], [name] 04/07/2022 10:00 A M Medical Record Number: [id number] Patient Account Number: [id number] Date of Birth/Sex: Treating RN:  Feb 28, 1970 (52 y.o. M) Primary Care Malania Gawthrop: [name] Other Clinician: Referring Luiz Trumpower: Treating Christos Mixson/Extender: [name] in Treatment: 39 Vital Signs Time Taken: 10:10 Temperature (F): 97.3 Height (in): 72 Pulse (bpm): 77 Weight (lbs): 312 Respiratory Rate (breaths/min): 18 Body Mass Index (BMI): 42.3 Blood Pressure (mmHg): 163/99 Capillary Blood Glucose (mg/dl): 177 Reference Range: 80 - 120 mg / dl Electronic Signature(s) Signed: 04/14/2022 4:17:02 PM By: Erenest Blank Entered By: Erenest Blank on 04/07/2022 10:12:52

## 2022-04-14 NOTE — Progress Notes (Signed)
[name], [name] ([id number]) [id number] Page 1 of 6 Visit Report for 04/14/2022 Arrival Information Details Patient Name: Date of Service: [name], [name] 04/14/2022 10:00 [name] Patient Account Number: [id number] Date of Birth/Sex: Treating RN: 12/13/1970 (52 y.o. [name] Primary Care [name]: [name] Other Clinician: Referring [name]: Treating [name]/Extender: [name] in Treatment: 16 Visit Information History Since Last Visit All ordered tests and consults were completed: No Patient Arrived: Ambulatory Added or deleted any medications: No Arrival Time: 09:56 Any new allergies or adverse reactions: No Accompanied By: self Had a fall or experienced change in No Transfer Assistance: None activities of daily living that may affect Patient Identification Verified: Yes risk of falls: Secondary Verification Process Completed: Yes Signs or symptoms of abuse/neglect since last visito No Patient Requires Transmission-Based Precautions: No Hospitalized since last visit: No Patient Has Alerts: Yes Implantable device outside of the clinic excluding No cellular tissue based products placed in the center since last visit: Pain Present Now: No Electronic Signature(s) Signed: 04/14/2022 10:43:43 AM By: [name] Entered By: [name] on 04/14/2022 09:56:21 -------------------------------------------------------------------------------- Encounter Discharge Information Details Patient Name: Date of Service: [name], [name] 04/14/2022 10:00 [name] Patient Account Number: [id number] Date of Birth/Sex: Treating RN: [birth year] (52 y.o. [name] Primary Care Molly Savarino: [name] Other Clinician: Referring Kimblery Diop: Treating Cal Gindlesperger/Extender: [name] in Treatment: 40 Encounter Discharge Information Items Post  Procedure Vitals Discharge Condition: Stable Temperature (F): 99.2 Ambulatory Status: Ambulatory Pulse (bpm): 82 Discharge Destination: Home Respiratory Rate (breaths/min): 20 Transportation: Private Auto Blood Pressure (mmHg): 159/84 Accompanied By: self Schedule Follow-up Appointment: Yes Clinical Summary of Care: Patient Declined Electronic Signature(s) Signed: 04/14/2022 4:39:46 PM By: Adline Peals Entered By: Adline Peals on 04/14/2022 10:45:28 -------------------------------------------------------------------------------- Lower Extremity Assessment Details Patient Name: Date of Service: [name], [name] 04/14/2022 10:00 [name] Patient Account Number: [id number] Date of Birth/Sex: Treating RN: 12/10/1970 (52 y.o. [name] Primary Care Paulita Licklider: [name] Other Clinician: Referring Kenyatte Gruber: Treating Yue Glasheen/Extender: [name] in Treatment: 40 Edema Assessment Assessed: [name]: No] [name]: No] S[LeftCAMRYNN, MACARI [id number] [Right: [id number] Page 2 of 6] Edema: [Left: N] [Right: o] Calf Left: Right: Point of Measurement: From Medial Instep 42.1 cm Ankle Left: Right: Point of Measurement: From Medial Instep 25 cm Electronic Signature(s) Signed: 04/14/2022 4:39:46 PM By: Adline Peals Entered By: Adline Peals on 04/14/2022 10:10:39 -------------------------------------------------------------------------------- Multi Wound Chart Details Patient Name: Date of Service: [name], [name] 04/14/2022 10:00 [name] Patient Account Number: [id number] Date of Birth/Sex: Treating RN: 1970-11-24 (52 y.o. [name] Primary Care Madisson Kulaga: [name] Other Clinician: Referring Shahad Mazurek: Treating Kao Conry/Extender: [name] in Treatment: 40 Vital Signs Height(in): 72 Capillary Blood  Glucose(mg/dl): 178 Weight(lbs): 312 Pulse(bpm): 82 Body Mass Index(BMI): 42.3 Blood Pressure(mmHg): 159/84 Temperature(F): 99.2 Respiratory Rate(breaths/min): 20 [2:Photos: No Photos Right, Lateral, Plantar Foot Wound Location: Gradually Appeared Wounding Event: Diabetic Wound/Ulcer of the Lower Primary Etiology: Extremity Type II Diabetes Comorbid History: 12/19/2016 Date Acquired: 40 Weeks of Treatment: Open Wound Status:  No Wound Recurrence: 0.3x0.3x1.3 Measurements L x W x D (cm) 0.071 A (cm) : rea 0.092 Volume (cm) : 98.00% % Reduction in A rea: 99.50% % Reduction in Volume: Grade 3 Classification: Medium Exudate A mount: Serosanguineous Exudate Type: red, brown  Exudate Color: Thickened Wound Margin: Large (67-100%)  Granulation A mount: Pink Granulation Quality: Small (1-33%) Necrotic A mount: Fat Layer (Subcutaneous Tissue): Yes N/A Exposed Structures: Fascia: No Tendon: No Muscle: No Joint: No Bone: No Small  (1-33%) Epithelialization: Callus: Yes Periwound Skin Texture: Maceration: Yes Periwound Skin Moisture: No Abnormalities Noted Periwound Skin Color: No Abnormality Temperature: Cellular or Tissue Based Product Procedures Performed: T Contact Cast otal]  [N/A:N/A N/A N/A N/A N/A N/A N/A N/A N/A N/A N/A N/A N/A N/A N/A N/A N/A N/A N/A N/A N/A N/A N/A N/A N/A N/A N/A N/A] Treatment Notes Electronic Signature(s) [name], [name] ([id number]) [id number] Page 3 of 6 Signed: 04/14/2022 10:29:12 AM By: [name] MD FACS Entered By: [name] on 04/14/2022 10:29:12 -------------------------------------------------------------------------------- Multi-Disciplinary Care Plan Details Patient Name: Date of Service: [name], [name] 04/14/2022 10:00 [name] Patient Account Number: [id number] Date of Birth/Sex: Treating RN: [birth year] (52 y.o. [name] Primary Care Dantonio Justen: [name] Other  Clinician: Referring Ismar Yabut: Treating Boaz Berisha/Extender: [name] in Treatment: [place] reviewed with physician Active Inactive Nutrition Nursing Diagnoses: Impaired glucose control: actual or potential Potential for alteratiion in Nutrition/Potential for imbalanced nutrition Goals: Patient/caregiver will maintain therapeutic glucose control Date Initiated: 07/29/2021 Target Resolution Date: 05/23/2022 Goal Status: Active Interventions: Assess patient nutrition upon admission and as needed per policy Provide education on elevated blood sugars and impact on wound healing Treatment Activities: Patient referred to Primary Care Physician for further nutritional evaluation : 07/29/2021 Notes: Wound/Skin Impairment Nursing Diagnoses: Impaired tissue integrity Goals: Patient/caregiver will verbalize understanding of skin care regimen Date Initiated: 07/04/2021 Target Resolution Date: 05/23/2022 Goal Status: Active Interventions: Assess ulceration(s) every visit Treatment Activities: Skin care regimen initiated : 07/04/2021 Notes: Electronic Signature(s) Signed: 04/14/2022 4:39:46 PM By: Adline Peals Entered By: Adline Peals on 04/14/2022 10:02:32 -------------------------------------------------------------------------------- Pain Assessment Details Patient Name: Date of Service: [place], [name] 04/14/2022 10:00 [name] Patient Account Number: [id number] Date of Birth/Sex: Treating RN: 1970-06-07 (52 y.o. [name] Primary Care Jeiden Daughtridge: [name] Other Clinician: Referring Jeovany Huitron: Treating Montel Vanderhoof/Extender: [name] in Treatment: [address] [name], Shorter [name] ([id number]) [id number] Page 4 of 6 Location of Pain Severity and Description of Pain Patient Has Paino No Site Locations Pain Management and Medication Current  Pain Management: Electronic Signature(s) Signed: 04/14/2022 10:43:43 AM By: [name] Entered By: [name] on 04/14/2022 09:58:03 -------------------------------------------------------------------------------- Patient/Caregiver Education Details Patient Name: Date of Service: [name] [name] 3/25/2024andnbsp10:00 [name] Patient Account Number: [id number] Date of Birth/Gender: Treating RN: 1970-09-20 (52 y.o. [name] Primary Care Physician: [name] Other Clinician: Referring Physician: Treating Physician/Extender: [name] in Treatment: 61 Education Assessment Education Provided To: Patient Education Topics Provided Wound/Skin Impairment: Methods: Explain/Verbal Responses: Reinforcements needed, State content correctly Electronic Signature(s) Signed: 04/14/2022 4:39:46 PM By: Adline Peals Entered By: Adline Peals on 04/14/2022 10:02:47 -------------------------------------------------------------------------------- Wound Assessment Details Patient Name: Date of Service: [name] [name] 04/14/2022 10:00 [name] Patient Account Number: [id number] Date of Birth/Sex: Treating RN: [birth year] (52 y.o. [name] Primary Care Tahliyah Anagnos: [name] Other Clinician: Referring Jamerson Vonbargen: Treating Mckensie Scotti/Extender: [name] in Treatment: 40 Wound Status Wound Number: 2 Primary Etiology: Diabetic Wound/Ulcer of the Lower Extremity Wound Location: Right, Lateral, Plantar Foot Wound Status: Open [name], [name] ([id number]) [id number] Page 5 of 6 Wounding Event: Gradually Appeared Comorbid History: Type II  Diabetes Date Acquired: 12/19/2016 Weeks Of Treatment: 40 Clustered Wound: No Wound Measurements Length: (cm) 0.3 Width: (cm) 0.3 Depth: (cm) 1.3 Area: (cm) 0.071 Volume: (cm) 0.092 % Reduction  in Area: 98% % Reduction in Volume: 99.5% Epithelialization: Small (1-33%) Tunneling: No Undermining: No Wound Description Classification: Grade 3 Wound Margin: Thickened Exudate Amount: Medium Exudate Type: Serosanguineous Exudate Color: red, brown Foul Odor After Cleansing: No Slough/Fibrino Yes Wound Bed Granulation Amount: Large (67-100%) Exposed Structure Granulation Quality: Pink Fascia Exposed: No Necrotic Amount: Small (1-33%) Fat Layer (Subcutaneous Tissue) Exposed: Yes Necrotic Quality: Adherent Slough Tendon Exposed: No Muscle Exposed: No Joint Exposed: No Bone Exposed: No Periwound Skin Texture Texture Color No Abnormalities Noted: No No Abnormalities Noted: Yes Callus: Yes Temperature / Pain Temperature: No Abnormality Moisture No Abnormalities Noted: No Maceration: Yes Treatment Notes Wound #2 (Foot) Wound Laterality: Plantar, Right, Lateral Cleanser Soap and Water Discharge Instruction: May shower and wash wound with dial antibacterial soap and water prior to dressing change. Wound Cleanser Discharge Instruction: Cleanse the wound with wound cleanser prior to applying a clean dressing using gauze sponges, not tissue or cotton balls. Peri-Wound Care Zinc Oxide Ointment 30g tube Discharge Instruction: Apply Zinc Oxide to periwound with each dressing change cotton tipped applicators Topical Primary Dressing Epicord Secondary Dressing ADAPTIC TOUCH 3x4.25 in Discharge Instruction: Apply over primary dressing as directed. Optifoam Non-Adhesive Dressing, 4x4 in Discharge Instruction: Apply over primary dressing as directed. Woven Gauze Sponges 2x2 in Discharge Instruction: moistened with saline. Apply over primary dressing as directed. Secured With Paper Tape, 2x10 (in/yd) Discharge Instruction: Secure dressing with tape as directed. Compression Wrap Kerlix Roll 4.5x3.1 (in/yd) Discharge Instruction: Apply Kerlix and Coban compression as  directed. Compression Stockings [name], [name] ([id number]) [id number] Page 6 of 6 Add-Ons Electronic Signature(s) Signed: 04/14/2022 4:39:46 PM By: Sabas Sous By: Adline Peals on 04/14/2022 10:12:56 -------------------------------------------------------------------------------- Vitals Details Patient Name: Date of Service: [name], [name] 04/14/2022 10:00 [name] Patient Account Number: [id number] Date of Birth/Sex: Treating RN: [birth year] (52 y.o. [name] Primary Care Dartanyon Frankowski: [name] Other Clinician: Referring Shell Blanchette: Treating Nicko Daher/Extender: [name] in Treatment: 40 Vital Signs Time Taken: 09:56 Temperature (F): 99.2 Height (in): 72 Pulse (bpm): 82 Weight (lbs): 312 Respiratory Rate (breaths/min): 20 Body Mass Index (BMI): 42.3 Blood Pressure (mmHg): 159/84 Capillary Blood Glucose (mg/dl): 178 Reference Range: 80 - 120 mg / dl Electronic Signature(s) Signed: 04/14/2022 10:43:43 AM By: [name] Entered By: [name] on 04/14/2022 09:56:44

## 2022-04-14 NOTE — Progress Notes (Signed)
[name], [name] ([id number]) [id number] Page 1 of 10 Visit Report for 04/14/2022 Chief Complaint Document Details Patient Name: Date of Service: [name], [name] 04/14/2022 10:00 [name] Patient Account Number: [id number] Date of Birth/Sex: Treating RN: April 09, 1970 (52 y.o. [name] Primary Care Provider: Garret [name] Other Clinician: Referring Provider: Treating Provider/Extender: [name] in Treatment: 68 Information Obtained from: Patient Chief Complaint Patients presents for treatment of an open diabetic ulcer Electronic Signature(s) Signed: 04/14/2022 10:29:20 AM By: [name] Maudlin MD FACS Entered By: [name] on 04/14/2022 10:29:20 -------------------------------------------------------------------------------- Cellular or Tissue Based Product Details Patient Name: Date of Service: [name], [name] 04/14/2022 10:00 [name] Patient Account Number: [id number] Date of Birth/Sex: Treating RN: [birth year] (52 y.o. [name] Primary Care Provider: Garret [name] Other Clinician: Referring Provider: Treating Provider/Extender: [name] in Treatment: 34 Cellular or Tissue Based Product Type Wound #2 Right,Lateral,Plantar Foot Applied to: Performed By: Physician [name] Maudlin, MD Cellular or Tissue Based Product Type: Epicord Level of Consciousness (Pre-procedure): Awake and Alert Pre-procedure Verification/Time Out Yes - 10:17 Taken: Location: genitalia / hands / feet / multiple digits Wound Size (sq cm): 0.09 Product Size (sq cm): 6 Waste Size (sq cm): 0 Amount of Product Applied (sq cm): 6 Instrument Used: Forceps, Scissors Lot #: [id number] Expiration Date: 07/21/2026 Fenestrated: No Reconstituted: Yes Solution Type: normal saline Solution Amount: 1 ml Lot #: [id number] Solution Expiration Date:  06/19/2024 Secured: Yes Secured With: Steri-Strips Dressing Applied: Yes Primary Dressing: adaptic Procedural Pain: 0 Post Procedural Pain: 0 Response to Treatment: Procedure was tolerated well Level of Consciousness (Post- Awake and Alert procedure): Post Procedure Diagnosis Same as Pre-procedure Notes scribed for Dr. Celine [name] by [name] Peals, RN Electronic Signature(s) [name], [place] Jerilynn Mages ([id number]) [phone].pdf Page 2 of 10 Signed: 04/14/2022 10:49:23 AM By: [name] Maudlin MD FACS Signed: 04/14/2022 4:39:46 PM By: [name] By: [name] -------------------------------------------------------------------------------- HPI Details Patient Name: Date of Service: [name], [name] 04/14/2022 10:00 [name] Patient Account Number: [id number] Date of Birth/Sex: Treating RN: October 18, 1970 (52 y.o. 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 The most recent amputation was in August 2022. At that operation, antibiotic beads were placed in the wound. He has been managed by podiatry for his procedures and management of his wounds. He has been in a Water engineer. He is on oral doxycycline. They have been using Betadine wet to dry dressings along with Iodosorb. The patient states that when he thinks the wound is getting too dry, he applies topical Neosporin. At his last visit, on June 7 of this year, the podiatrist determined that he felt the wound was stalled and referred him to wound care for additional evaluation and  management. An MRI has been ordered, but not yet scheduled or performed. Pathology from his operation in August 2022 demonstrated findings consistent with chronic osteomyelitis. Today, there is a large irregular wound on the plantar surface of his right foot, at about the level of the fifth metatarsal head. This tracks through to a pinpoint opening on the dorsal lateral portion of  his foot. The intake nurse reported purulent drainage. There is some malodor from the wound. No frank necrosis identified. 07/11/2021: Today, the wounds do not connect. I attempted multiple times from various directions and the shared tunnel is no longer open. He has some slough accumulation on the dorsal part of his foot as well as slough and callus buildup on the plantar surface. His MRI is scheduled for June 29. No purulent drainage or malodor appreciated today. 07/19/2021: The lateral foot wound has closed and there is no tunnel connecting the plantar foot wound to that site. The plantar foot wound still probes quite deeply, however. There is some slough, eschar, and nonviable tissue accumulated in the wound bed. No malodorous drainage present. His MRI was performed yesterday and is consistent with fairly extensive osteomyelitis. 07/29/2021: The patient has an appointment with infectious disease on July 18 to treat his osteomyelitis. The plantar wound still probes quite deeply, approaching bone. The orifice has narrowed quite substantially, however, making it more difficult for him to pack. 08/05/2021: The tunnel connecting the lateral foot wound to the plantar foot wound has reopened. He sees infectious disease tomorrow to discuss long-term antibiotic treatment for osteomyelitis. There was a bit of murky drainage in the wound, but this was noted after he had had topical lidocaine applied so may have just been a blob of the anesthetic. No odor or frank purulent drainage. The wound probes deeply at the midfoot approaching  bone. He does have MRI results confirming his diagnosis of osteomyelitis. He has failed to progress with conventional treatment and I think his best chance for preservation of the foot is to initiate hyperbaric oxygen therapy. 08/13/2021: The tunnel connecting the 2 wounds has closed again. He has a PICC line and is getting IV daptomycin and cefepime. EKG and chest x-ray are within normal limits. He is scheduled to start hyperbaric oxygen therapy tomorrow. The wound in his midfoot probes deeply, approaching bone. The skin at the orifice continues to heaped up and threatens to close over despite the large cavity within. No erythema, induration, or purulent drainage. The wound on his lateral foot is fairly small and quite clean. 08/19/2021: The lateral foot wound has nearly closed. The wound in his midfoot does not probe quite as deeply today. The skin at the orifice continues to try to roll in and obscure the opening. No frankly necrotic tissue appreciated. He is tolerating hyperbaric oxygen therapy. 08/26/2021: The lateral foot wound has closed completely. The wound in his midfoot is shallower again today. The wound orifice is contracting. We are using gentamicin and silver alginate. He continues on IV daptomycin and cefepime and is tolerating hyperbaric oxygen therapy without difficulty. 09/02/2021: His foot is a little bit sore today but he was up walking on it all weekend doing back to school shopping with his daughter. The tunneling is down to about 3 cm and I do not appreciate bone at the tip of the probe. The wound is clean but has some callus creating an overhanging lip at the distal aspect. He continues to receive hyperbaric oxygen therapy as well as IV daptomycin and cefepime. 09/09/2021: His wound continues to improve. The tunnel has come in by 0.9 cm. He continues to form callus around the orifice of the wound. He is tolerating hyperbaric oxygen therapy along with his IV antibiotics. 09/16/2021:  The tunneling is down to just half a centimeter. He continues to build up callus around the orifice of the wound. He completed his course of  IV antibiotics and his PICC line is scheduled to be removed this Friday. He is tolerating hyperbaric oxygen without difficulty. We have been applying topical gentamicin and silver alginate to his wound. 09/24/2021: The wound depth has come in even further. There is a little bit of callus buildup around the orifice. The opening is quite narrow, at this point. 09/30/2021: The wound depth is about the same this week. The opening continues to contract with callus accumulation. There is some discoloration of the skin on the lateral part of his foot and he admitted to walking more than usual this weekend and also wearing a different pair shoes. He continues to tolerate hyperbaric oxygen therapy without difficulty. 10/07/2021: The wound depth has contracted a bit and is now approximately 1 to 1.5 cm. The orifice of the wound continues to try and close in over the space. There is just some callus and skin around the margins obscuring the orifice. 10/14/2021: The wound depth has come in a little bit more. The orifice of the wound is rolling inwards with epithelium and callus, making it difficult to pack the wound. 10/21/2021: There is still 1 portion of the wound, at the lateral aspect, that is still a couple of centimeters deep. The architecture of the wound makes this somewhat challenging to access and pack. Everything else looks clean. He has his usual accumulation of callus and skin, narrowing the orifice. [name], [name] ([id number]) [id number] Page 3 of 10 10/29/2021: No significant change to the depth of the wound. The orifice continues to contract with callus and skin narrowing the opening. His wife has been packing the wound and doing an excellent job. 11/05/2021: The depth of the wound has come in by about a centimeter. There is less  callus accumulation around the orificedespite this, the wound is narrower today. We are still awaiting insurance approval for Kerecis powder. 11/12/2021: The depth of the wound continues to contract. He has accumulated some callus around the orifice, as usual. We have received a verbal confirmation that he is approved for Kerecis, but no formal paperwork has yet been received. 11/19/2021: The depth continues to come in. There is callus accumulation around the orifice, as usual. He has been formally approved for Kerecis, but unfortunately we do not have a billing mechanism for the powder in [employer] yet. We are working to address this so we can begin treatment. 11/25/2021: Continued filling in of the depth of the wound. Continued buildup of callus around the orifice. No concern for infection. As we do not have a way to bill for Kerecis powder, but are capable of billing for the [name] sheet, we are going to use that on him instead. 12/03/2021: The depth of the wound continues to fill in. As usual, he has accumulated callus and skin around the orifice of the wound. No concern for infection. He will complete his hyperbaric oxygen therapy this week. 12/17/2021: The wound depth continues to contract. There is callus and skin accumulation around the orifice of the wound, as per usual. There is more epithelium on the actual wound surface. Unfortunately, his Leroy Kennedy was not ordered. 12/24/2021: The wound depth has come in a little bit more this week. There is less skin accumulation around the orifice, but still some callus. On questioning, he admits to spending a fair amount of time on his feet. 12/12; this is a very difficult wound to assess size slitlike wound with some depth but minimal opening. I applied Kerecis No. 4 12/18; Kerecis No. 5  01/15/2022: His wound measured deeper today. There is also some evidence of pressure induced tissue injury around the lateral aspect of his foot. The patient reports that  he was wearing different shoes than usual and felt like his foot was rolling a lot over the weekend. There is heavy callus around the orifice of the wound. No concern for infection. 01/21/2022: The depth decreased today from 1.7 to 1.0 cm. Significantly less tissue damage as compared to last week. Still with callus and skin accumulation around the orifice. 01/29/2022: The wound continues to contract and the depth is coming in further. We are still working on getting [employer] for him. 02/04/2022: No real change this week. Still working on the [employer]. 02/11/2022: No significant changes again. We are still working on the [employer]. 02/18/2022: No change to the wound. We have submitted in IVR for [place]. 02/25/2022: Unfortunately, his wound has deteriorated over the past week. It is deeper and is approaching bone once again. There is more slough accumulation and the tissue shows signs of pressure injury. He says that he has been trying to wear his new work boots, which are waterproof. He says they make his feet sweat excessively. I think our main challenge with him is that we just are not able to adequately offload him while he is still working and on his feet through much of the day. He is willing to consider total contact casting, but will need to make some arrangements with work. On a more positive note, however, he has been approved for Epicord. 03/03/2022: The wound probed a little bit deeper again today. We are planning to put him in a total contact cast as soon as he has cleared it with work to take some time off. 03/10/2022: The wound was a little bit shallower today and the overall appearance is significantly improved. We are planning to put him in a cast today. 03/12/2022: He is here for his first total contact cast change. He denies any issues with pressure, rubbing, or pain. 03/17/2022: For some reason, the wound probes deeper today. There is some slough and nonviable  subcutaneous tissue around the opening. He denies any difficulty with the total contact cast. 03/24/2022: The wound is shallower today. There is better granulation tissue visible on the wound surface. 03/31/2022: His cast protector got a hole in it and his cast got wet. His foot is macerated. The wound measured a little deeper today. There is some hypertrophic granulation tissue at the orifice of his wound. 3/18; patient presents for follow-up. He had no issues with the total contact cast. We have been using epi cord to the wound bed. Overall wound is stable. Patient denies signs of infection. 04/14/2022: The depth of the wound has rather remarkably decreased to 1.3 cm today. The orifice of the wound has also contracted quite a bit. Electronic Signature(s) Signed: 04/14/2022 10:29:56 AM By: [name] Maudlin MD FACS Entered By: [name] on 04/14/2022 10:29:56 -------------------------------------------------------------------------------- Physical Exam Details Patient Name: Date of Service: [name], [name] 04/14/2022 10:00 [name] Patient Account Number: [id number] Date of Birth/Sex: Treating RN: 1970-08-08 (52 y.o. [name] Primary Care Provider: Garret [name] Other Clinician: Referring Provider: Treating Provider/Extender: [name] in Treatment: 40 Constitutional [name], [place] [name] ([id number]) [id number] Page 4 of 10 Hypertensive, asymptomatic. . . . no acute distress. Respiratory Normal work of breathing on room air. Notes 04/14/2022: The depth of the wound has rather remarkably decreased to 1.3  cm today. The orifice of the wound has also contracted quite a bit. Electronic Signature(s) Signed: 04/14/2022 10:30:29 AM By: [name] Maudlin MD FACS Entered By: [name] on 04/14/2022 10:30:29 -------------------------------------------------------------------------------- Physician Orders  Details Patient Name: Date of Service: [name], [name] 04/14/2022 10:00 [name] Medical Record Number: [id number] Patient Account Number: [id number] Date of Birth/Sex: Treating RN: [birth year] (52 y.o. 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 - Dr [name] RM 1 Return A Anesthetic Wound #2 Right,Lateral,Plantar Foot (In clinic) Topical Lidocaine 4% applied to wound bed Cellular or Tissue Based Products Cellular or Tissue Based Product Type: - Epicord #8 Cellular or Tissue Based Product applied to wound bed, secured with steri-strips, cover with Adaptic or Mepitel. (DO NOT REMOVE). Bathing/ Shower/ Hygiene May shower with protection but do not get wound dressing(s) wet. Protect dressing(s) with water repellant cover (for example, large plastic bag) or a cast cover and may then take shower. Off-Loading Total Contact Cast to Right Lower Extremity Other: - Try to not stand on your feet too much, minimize walking Wound Treatment Wound #2 - Foot Wound Laterality: Plantar, Right, Lateral Cleanser: Soap and Water 1 x Per Week/30 Days Discharge Instructions: May shower and wash wound with dial antibacterial soap and water prior to dressing change. Cleanser: Wound Cleanser (Generic) 1 x Per Week/30 Days Discharge Instructions: Cleanse the wound with wound cleanser prior to applying a clean dressing using gauze sponges, not tissue or cotton balls. Peri-Wound Care: Zinc Oxide Ointment 30g tube 1 x Per Week/30 Days Discharge  Instructions: Apply Zinc Oxide to periwound with each dressing change Peri-Wound Care: cotton tipped applicators (Generic) 1 x Per Week/30 Days Prim Dressing: Epicord ary 1 x Per Week/30 Days Secondary Dressing: ADAPTIC TOUCH 3x4.25 in 1 x Per Week/30 Days Discharge Instructions: Apply over primary dressing as directed. Secondary Dressing: Optifoam Non-Adhesive Dressing, 4x4 in 1 x Per Week/30 Days Discharge Instructions: Apply over primary dressing as directed. Secondary Dressing: Woven Gauze Sponges 2x2 in 1 x Per Week/30 Days Discharge Instructions: moistened with saline. Apply over primary dressing as directed. [name], [name] ([id number]) [id number] Page 5 of 10 Secured With: Paper Tape, 2x10 (in/yd) (Generic) 1 x Per Week/30 Days Discharge Instructions: Secure dressing with tape as directed. Compression Wrap: Kerlix Roll 4.5x3.1 (in/yd) (Generic) 1 x Per Week/30 Days Discharge Instructions: Apply Kerlix and Coban compression as directed. Patient Medications llergies: No Known Allergies A Notifications Medication Indication Start End 04/14/2022 lidocaine DOSE topical 4 % cream - cream topical Electronic Signature(s) Signed: 04/14/2022 10:49:23 AM By: [name] Maudlin MD FACS Entered By: [name] on 04/14/2022 10:30:47 -------------------------------------------------------------------------------- Problem List Details Patient Name: Date of Service: [name], [name] 04/14/2022 10:00 [name] Medical Record Number: [id number] Patient Account Number: [id number] Date of Birth/Sex: Treating RN: 11-11-1970 (52 y.o. [name] Primary Care Provider: Garret [name] Other Clinician: Referring Provider: Treating Provider/Extender: [name] in Treatment: 66 Active Problems ICD-10 Encounter Code Description Active Date MDM Diagnosis L97.514 Non-pressure chronic ulcer of other part of right foot with necrosis of bone 07/04/2021 No  Yes E11.49 Type 2 diabetes mellitus with other  diabetic neurological complication 99991111 No Yes M86.671 Other chronic osteomyelitis, right ankle and foot 07/04/2021 No Yes Inactive Problems Resolved Problems Electronic Signature(s) Signed: 04/14/2022 10:28:37 AM By: [name] Maudlin MD FACS Entered By: [name] on 04/14/2022 10:28:37 -------------------------------------------------------------------------------- Progress Note Details Patient Name: Date of Service: [name], [name] 04/14/2022 10:00 [name] Patient Account Number: [id number] Date of Birth/Sex: Treating RN: [birth year] (52 y.o. 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 The most recent amputation was in August 2022. At that operation, antibiotic beads were placed in the wound. He has been managed by podiatry for his procedures and management of his wounds. He has been in a Water engineer. He is on oral doxycycline. They have been using Betadine wet to dry dressings along with Iodosorb. The patient states that when he thinks the wound is getting too dry, he applies topical Neosporin. At his last visit, on June 7 of this year, the podiatrist determined that he felt the wound was stalled and referred him to wound care for additional evaluation and management. An MRI has been ordered, but not yet scheduled or performed. Pathology from his  operation in August 2022 demonstrated findings consistent with chronic osteomyelitis. Today, there is a large irregular wound on the plantar surface of his right foot, at about the level of the fifth metatarsal head. This tracks through to a pinpoint opening on the dorsal lateral portion of his foot. The intake nurse reported purulent drainage. There is some malodor from the wound. No frank necrosis identified. 07/11/2021: Today, the wounds do not connect. I attempted multiple times from various directions and the shared tunnel is no longer open. He has some slough accumulation on the dorsal part of his foot as well as slough and callus buildup on the plantar surface. His MRI is scheduled for June 29. No purulent drainage or malodor appreciated today. 07/19/2021: The lateral foot wound has closed and there is no tunnel connecting the plantar foot wound to that site. The plantar foot wound still probes quite deeply, however. There is some slough, eschar, and nonviable tissue accumulated in the wound bed. No malodorous drainage present. His MRI was performed yesterday and is consistent with fairly extensive osteomyelitis. 07/29/2021: The patient has an appointment with infectious disease on July 18 to treat his osteomyelitis. The plantar wound still probes quite deeply, approaching bone. The orifice has narrowed quite substantially, however, making it more difficult for him to pack. 08/05/2021: The tunnel connecting the lateral foot wound to the plantar foot wound has reopened. He sees infectious disease tomorrow to discuss long-term antibiotic treatment for osteomyelitis. There was a bit of murky drainage in the wound, but this was noted after he had had topical lidocaine applied so may have just been a blob of the anesthetic. No odor or frank purulent drainage. The wound probes deeply at the midfoot approaching bone. He does have MRI results confirming his diagnosis of osteomyelitis. He has failed to  progress with conventional treatment and I think his best chance for preservation of  the foot is to initiate hyperbaric oxygen therapy. 08/13/2021: The tunnel connecting the 2 wounds has closed again. He has a PICC line and is getting IV daptomycin and cefepime. EKG and chest x-ray are within normal limits. He is scheduled to start hyperbaric oxygen therapy tomorrow. The wound in his midfoot probes deeply, approaching bone. The skin at the orifice continues to heaped up and threatens to close over despite the large cavity within. No erythema, induration, or purulent drainage. The wound on his lateral foot is fairly small and quite clean. 08/19/2021: The lateral foot wound has nearly closed. The wound in his midfoot does not probe quite as deeply today. The skin at the orifice continues to try to roll in and obscure the opening. No frankly necrotic tissue appreciated. He is tolerating hyperbaric oxygen therapy. 08/26/2021: The lateral foot wound has closed completely. The wound in his midfoot is shallower again today. The wound orifice is contracting. We are using gentamicin and silver alginate. He continues on IV daptomycin and cefepime and is tolerating hyperbaric oxygen therapy without difficulty. 09/02/2021: His foot is a little bit sore today but he was up walking on it all weekend doing back to school shopping with his daughter. The tunneling is down to about 3 cm and I do not appreciate bone at the tip of the probe. The wound is clean but has some callus creating an overhanging lip at the distal aspect. He continues to receive hyperbaric oxygen therapy as well as IV daptomycin and cefepime. 09/09/2021: His wound continues to improve. The tunnel has come in by 0.9 cm. He continues to form callus around the orifice of the wound. He is tolerating hyperbaric oxygen therapy along with his IV antibiotics. 09/16/2021: The tunneling is down to just half a centimeter. He continues to build up callus around the  orifice of the wound. He completed his course of IV antibiotics and his PICC line is scheduled to be removed this Friday. He is tolerating hyperbaric oxygen without difficulty. We have been applying topical gentamicin and silver alginate to his wound. 09/24/2021: The wound depth has come in even further. There is a little bit of callus buildup around the orifice. The opening is quite narrow, at this point. 09/30/2021: The wound depth is about the same this week. The opening continues to contract with callus accumulation. There is some discoloration of the skin on the lateral part of his foot and he admitted to walking more than usual this weekend and also wearing a different pair shoes. He continues to tolerate hyperbaric oxygen therapy without difficulty. 10/07/2021: The wound depth has contracted a bit and is now approximately 1 to 1.5 cm. The orifice of the wound continues to try and close in over the space. There is just some callus and skin around the margins obscuring the orifice. 10/14/2021: The wound depth has come in a little bit more. The orifice of the wound is rolling inwards with epithelium and callus, making it difficult to pack the wound. 10/21/2021: There is still 1 portion of the wound, at the lateral aspect, that is still a couple of centimeters deep. The architecture of the wound makes this somewhat challenging to access and pack. Everything else looks clean. He has his usual accumulation of callus and skin, narrowing the orifice. 10/29/2021: No significant change to the depth of the wound. The orifice continues to contract with callus and skin narrowing the opening. His wife has been packing the wound and doing an excellent job. 11/05/2021: The  depth of the wound has come in by about a centimeter. There is less callus accumulation around the orificeoodespite this, the wound is narrower today. We are still awaiting insurance approval for Kerecis powder. 11/12/2021: The depth of the  wound continues to contract. He has accumulated some callus around the orifice, as usual. We have received a verbal confirmation that he is approved for Kerecis, but no formal paperwork has yet been received. 11/19/2021: The depth continues to come in. There is callus accumulation around the orifice, as usual. He has been formally approved for Kerecis, but unfortunately we do not have a billing mechanism for the powder in [facility] yet. We are working to address this so we can begin treatment. 11/25/2021: Continued filling in of the depth of the wound. Continued buildup of callus around the orifice. No concern for infection. As we do not have a way to bill for Kerecis powder, but are capable of billing for the [facility] sheet, we are going to use that on him instead. 12/03/2021: The depth of the wound continues to fill in. As usual, he has accumulated callus and skin around the orifice of the wound. No concern for infection. He will complete his hyperbaric oxygen therapy this week. [name], [name] ([id number]) [id number] Page 7 of 10 12/17/2021: The wound depth continues to contract. There is callus and skin accumulation around the orifice of the wound, as per usual. There is more epithelium on the actual wound surface. Unfortunately, his Leroy Kennedy was not ordered. 12/24/2021: The wound depth has come in a little bit more this week. There is less skin accumulation around the orifice, but still some callus. On questioning, he admits to spending a fair amount of time on his feet. 12/12; this is a very difficult wound to assess size slitlike wound with some depth but minimal opening. I applied Kerecis No. 4 12/18; Kerecis No. 5 01/15/2022: His wound measured deeper today. There is also some evidence of pressure induced tissue injury around the lateral aspect of his foot. The patient reports that he was wearing different shoes than usual and felt like his foot was rolling a lot over the  weekend. There is heavy callus around the orifice of the wound. No concern for infection. 01/21/2022: The depth decreased today from 1.7 to 1.0 cm. Significantly less tissue damage as compared to last week. Still with callus and skin accumulation around the orifice. 01/29/2022: The wound continues to contract and the depth is coming in further. We are still working on getting [employer] for him. 02/04/2022: No real change this week. Still working on the [employer]. 02/11/2022: No significant changes again. We are still working on the [employer]. 02/18/2022: No change to the wound. We have submitted in IVR for [place]. 02/25/2022: Unfortunately, his wound has deteriorated over the past week. It is deeper and is approaching bone once again. There is more slough accumulation and the tissue shows signs of pressure injury. He says that he has been trying to wear his new work boots, which are waterproof. He says they make his feet sweat excessively. I think our main challenge with him is that we just are not able to adequately offload him while he is still working and on his feet through much of the day. He is willing to consider total contact casting, but will need to make some arrangements with work. On a more positive note, however, he has been approved for Epicord. 03/03/2022: The wound probed a little bit deeper again today.  We are planning to put him in a total contact cast as soon as he has cleared it with work to take some time off. 03/10/2022: The wound was a little bit shallower today and the overall appearance is significantly improved. We are planning to put him in a cast today. 03/12/2022: He is here for his first total contact cast change. He denies any issues with pressure, rubbing, or pain. 03/17/2022: For some reason, the wound probes deeper today. There is some slough and nonviable subcutaneous tissue around the opening. He denies any difficulty with the total contact  cast. 03/24/2022: The wound is shallower today. There is better granulation tissue visible on the wound surface. 03/31/2022: His cast protector got a hole in it and his cast got wet. His foot is macerated. The wound measured a little deeper today. There is some hypertrophic granulation tissue at the orifice of his wound. 3/18; patient presents for follow-up. He had no issues with the total contact cast. We have been using epi cord to the wound bed. Overall wound is stable. Patient denies signs of infection. 04/14/2022: The depth of the wound has rather remarkably decreased to 1.3 cm today. The orifice of the wound has also contracted quite a bit. Patient History Information obtained from Patient. Social History Never smoker, Marital Status - Married, Alcohol Use - Never, Drug Use - No History, Caffeine Use - Daily - T coffee. ea; Medical History Endocrine Patient has history of Type II Diabetes Hospitalization/Surgery History - Amuptation of 3rd,4th and 5th toes of Right foot;Oral Surgery;Anal Fissure surgery; Cholecystectomy. Objective Constitutional Hypertensive, asymptomatic. no acute distress. Vitals Time Taken: 9:56 AM, Height: 72 in, Weight: 312 lbs, BMI: 42.3, Temperature: 99.2 F, Pulse: 82 bpm, Respiratory Rate: 20 breaths/min, Blood Pressure: 159/84 mmHg, Capillary Blood Glucose: 178 mg/dl. Respiratory Normal work of breathing on room air. General Notes: 04/14/2022: The depth of the wound has rather remarkably decreased to 1.3 cm today. The orifice of the wound has also contracted quite a bit. [address], Skin) [name], [name] ([id number]) [id number] Page 8 of 10 Wound #2 status is Open. Original cause of wound was Gradually Appeared. The date acquired was: 12/19/2016. The wound has been in treatment 40 weeks. The wound is located on the [place]. The wound measures 0.3cm length x 0.3cm width x 1.3cm depth; 0.071cm^2 area and  0.092cm^3 volume. There is Fat Layer (Subcutaneous Tissue) exposed. There is no tunneling or undermining noted. There is a medium amount of serosanguineous drainage noted. The wound margin is thickened. There is large (67-100%) pink granulation within the wound bed. There is a small (1-33%) amount of necrotic tissue within the wound bed including Adherent Slough. The periwound skin appearance had no abnormalities noted for color. The periwound skin appearance exhibited: Callus, Maceration. Periwound temperature was noted as No Abnormality. 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 A skin graft procedure using a bioengineered skin substitute/cellular or tissue based product was performed by [name] Maudlin, MD with the following instrument(s): Forceps and Scissors. Epicord was applied and secured with Steri-Strips. 6 sq cm of product was utilized and 0 sq cm was wasted. Post Application, adaptic was applied. A Time Out was conducted at 10:17, prior to the start of  the procedure. The procedure was tolerated well with a pain level of 0 throughout and a pain level of 0 following the procedure. Post procedure Diagnosis Wound #2: Same as Pre-Procedure General Notes: scribed for Dr. Celine [name] by [name] Peals, RN. Pre-procedure diagnosis of Wound #2 is a Diabetic Wound/Ulcer of the Lower Extremity located on the Right,Lateral,Plantar Foot . There was a T Contact otal Cast Procedure by [name] Maudlin, MD. Post procedure Diagnosis Wound #2: Same as Pre-Procedure Notes: scribed for Dr. Celine [name] by [name] Peals, RN. Plan Follow-up Appointments: Return  Appointment in 1 week. - Dr [name] RM 1 Anesthetic: Wound #2 Right,Lateral,Plantar Foot: (In clinic) Topical Lidocaine 4% applied to wound bed Cellular or Tissue Based Products: Cellular or Tissue Based Product Type: - Epicord #8 Cellular or Tissue Based Product applied to wound bed, secured with steri-strips, cover with Adaptic or Mepitel. (DO NOT REMOVE). Bathing/ Shower/ Hygiene: May shower with protection but do not get wound dressing(s) wet. Protect dressing(s) with water repellant cover (for example, large plastic bag) or a cast cover and may then take shower. Off-Loading: T Contact Cast to Right Lower Extremity otal Other: - Try to not stand on your feet too much, minimize walking The following medication(s) was prescribed: lidocaine topical 4 % cream cream topical was prescribed at facility WOUND #2: - Foot Wound Laterality: Plantar, Right, Lateral Cleanser: Soap and Water 1 x Per Week/30 Days Discharge Instructions: May shower and wash wound with dial antibacterial soap and water prior to dressing change. Cleanser: Wound Cleanser (Generic) 1 x Per Week/30 Days Discharge Instructions: Cleanse the wound with wound cleanser prior to applying a clean dressing using gauze sponges, not tissue or cotton balls. Peri-Wound Care: Zinc Oxide Ointment 30g tube 1 x Per Week/30 Days Discharge Instructions: Apply Zinc Oxide to periwound with each dressing change Peri-Wound Care: cotton tipped applicators (Generic) 1 x Per Week/30 Days Prim Dressing: Epicord 1 x Per Week/30 Days ary Secondary Dressing: ADAPTIC TOUCH 3x4.25 in 1 x Per Week/30 Days Discharge Instructions: Apply over primary dressing as directed. Secondary Dressing: Optifoam Non-Adhesive Dressing, 4x4 in 1 x Per Week/30 Days Discharge Instructions: Apply over primary dressing as directed. Secondary Dressing: Woven Gauze Sponges 2x2 in 1 x Per Week/30 Days Discharge Instructions: moistened with saline. Apply over primary dressing  as directed. Secured With: Paper T ape, 2x10 (in/yd) (Generic) 1 x Per Week/30 Days Discharge Instructions: Secure dressing with tape as directed. Com pression Wrap: Kerlix Roll 4.5x3.1 (in/yd) (Generic) 1 x Per Week/30 Days Discharge Instructions: Apply Kerlix and Coban compression as directed. 04/14/2022: The depth of the wound has rather remarkably decreased to 1.3 cm today. The orifice of the wound has also contracted quite a bit. [name], [name] ([id number]) [id number] Page 9 of 10 Epicord was prepared in standard fashion and packed into the wound. No collagen was needed this week to fill the remaining space, as there was none. The skin substitute was secured with Adaptic and Steri-Strips. A gauze sponge was used as a bolster. A total contact cast was then applied without difficulty. Follow-up in 1 week. Electronic Signature(s) Signed: 04/14/2022 10:31:54 AM By: [name] Maudlin MD FACS Entered By: [name] on 04/14/2022 10:31:53 -------------------------------------------------------------------------------- HxROS Details Patient Name: Date of Service: [name], [name] 04/14/2022 10:00 [name] Patient Account Number: [id number] Date of Birth/Sex: Treating RN: 03/26/1970 (52 y.o. [name] Primary Care Provider: Garret [name] Other Clinician: Referring Provider: Treating Provider/Extender: [name] in Treatment: 2 Information  Obtained From Patient Endocrine Medical History: Positive for: Type II Diabetes Immunizations Pneumococcal Vaccine: Received Pneumococcal Vaccination: Yes Received Pneumococcal Vaccination On or After 60th Birthday: No Implantable Devices None Hospitalization / Surgery History Type of Hospitalization/Surgery Amuptation of 3rd,4th and 5th toes of Right foot;Oral Surgery;Anal Fissure surgery; Cholecystectomy Family and Social History Never smoker; Marital Status -  Married; Alcohol Use: Never; Drug Use: No History; Caffeine Use: Daily - T coffee; Financial Concerns: No; Food, ea; Clothing or Shelter Needs: No; Support System Lacking: No; Transportation Concerns: No Electronic Signature(s) Signed: 04/14/2022 10:49:23 AM By: [name] Maudlin MD FACS Entered By: [name] on 04/14/2022 10:30:02 -------------------------------------------------------------------------------- Total Contact Cast Details Patient Name: Date of Service: [name], [name] 04/14/2022 10:00 [name] Medical Record Number: [id number] Patient Account Number: [id number] Date of Birth/Sex: Treating RN: 27-Feb-1970 (52 y.o. [name] Primary Care Provider: Garret [name] Other Clinician: Referring Provider: Treating Provider/Extender: [name] in Treatment: 28 T Contact Cast Applied for Wound Assessment: otal Wound #2 Right,Lateral,Plantar Foot Performed By: Physician [name] Maudlin, MD Post Procedure Diagnosis Same as Pre-procedure Notes scribed for Dr. Celine [name] by [name] Peals, RN Electronic Signature(s) Signed: 04/14/2022 10:49:23 AM By: [name] Maudlin MD FACS [name], [name] ([id number]) [id number] Page 10 of 10 Signed: 04/14/2022 4:39:46 PM By: [name] Entered By: [name] on 04/14/2022 10:17:30 -------------------------------------------------------------------------------- SuperBill Details Patient Name: Date of Service: [name], [name] 04/14/2022 Medical Record Number: [id number] Patient Account Number: [id number] Date of Birth/Sex: Treating RN: 15-Aug-1970 (52 y.o. [name] Primary Care Provider: Garret [name] Other Clinician: Referring Provider: Treating Provider/Extender: [name] in Treatment: 40 Diagnosis Coding ICD-10 Codes Code Description [phone] Non-pressure chronic ulcer of other part of right foot with necrosis of bone E11.49  Type 2 diabetes mellitus with other diabetic neurological complication 123456 Other chronic osteomyelitis, right ankle and foot Facility Procedures : CPT4 Code: [id number] Description: [id number] Epicord 2cm x 3cm - per sqcm Modifier: Quantity: 6 : CPT4 Code: [id number] Description: [id number] - SKIN SUB GRAFT FACE/NK/HF/G ICD-10 Diagnosis Description L97.514 Non-pressure chronic ulcer of other part of right foot with necrosis of bone Modifier: Quantity: 1 : CPT4 Code: [id number] Description: [id number] - APPLY TOTAL CONTACT LEG CAST ICD-10 Diagnosis Description L97.514 Non-pressure chronic ulcer of other part of right foot with necrosis of bone Modifier: Quantity: 1 Physician Procedures : CPT4 Code Description Modifier [id number] - WC PHYS LEVEL 4 - EST PT ICD-10 Diagnosis Description L97.514 Non-pressure chronic ulcer of other part of right foot with necrosis of bone E11.49 Type 2 diabetes mellitus with other diabetic neurological  complication 123456 Other chronic osteomyelitis, right ankle and foot Quantity: 1 : [id number] - WC PHYS SKIN SUB GRAFT FACE/NK/HF/G ICD-10 Diagnosis Description L97.514 Non-pressure chronic ulcer of other part of right foot with necrosis of bone Quantity: 1 : [id number] - WC PHYS APPLY TOTAL CONTACT CAST ICD-10 Diagnosis Description L97.514 Non-pressure chronic ulcer of other part of right foot with necrosis of bone Quantity: 1 Electronic Signature(s) Signed: 04/14/2022 10:32:21 AM By: [name] Maudlin MD FACS Entered By: [name] on 04/14/2022 10:32:20

## 2022-04-21 ENCOUNTER — Ambulatory Visit: Payer: BLUE CROSS/BLUE SHIELD | Admitting: Family Medicine

## 2022-04-21 ENCOUNTER — Encounter (HOSPITAL_BASED_OUTPATIENT_CLINIC_OR_DEPARTMENT_OTHER): Payer: BLUE CROSS/BLUE SHIELD | Attending: General Surgery | Admitting: General Surgery

## 2022-04-21 ENCOUNTER — Encounter: Payer: Self-pay | Admitting: Family Medicine

## 2022-04-21 VITALS — BP 132/82 | HR 76 | Temp 97.9°F | Ht 72.0 in | Wt 309.0 lb

## 2022-04-21 DIAGNOSIS — E1149 Type 2 diabetes mellitus with other diabetic neurological complication: Secondary | ICD-10-CM | POA: Diagnosis not present

## 2022-04-21 DIAGNOSIS — E1169 Type 2 diabetes mellitus with other specified complication: Secondary | ICD-10-CM | POA: Diagnosis not present

## 2022-04-21 DIAGNOSIS — E11621 Type 2 diabetes mellitus with foot ulcer: Secondary | ICD-10-CM | POA: Diagnosis present

## 2022-04-21 DIAGNOSIS — L97512 Non-pressure chronic ulcer of other part of right foot with fat layer exposed: Secondary | ICD-10-CM | POA: Diagnosis not present

## 2022-04-21 DIAGNOSIS — I10 Essential (primary) hypertension: Secondary | ICD-10-CM

## 2022-04-21 DIAGNOSIS — E785 Hyperlipidemia, unspecified: Secondary | ICD-10-CM

## 2022-04-21 DIAGNOSIS — L97514 Non-pressure chronic ulcer of other part of right foot with necrosis of bone: Secondary | ICD-10-CM | POA: Diagnosis not present

## 2022-04-21 DIAGNOSIS — Z89421 Acquired absence of other right toe(s): Secondary | ICD-10-CM | POA: Diagnosis not present

## 2022-04-21 MED ORDER — TRESIBA FLEXTOUCH 200 UNIT/ML ~~LOC~~ SOPN: 80.0000 [IU] | PEN_INJECTOR | Freq: Every day | SUBCUTANEOUS | 5 refills | Status: DC

## 2022-04-21 NOTE — Patient Instructions (Addendum)
Please stop by lab before you go If you have mychart- we will send your results within 3 business days of [place] receiving them.  If you do not have mychart- we will call you about results within 5 business days of [place] receiving them.  *please also note that you will see labs on mychart as soon as they post. I will later go in and write notes on them- will say "notes from Dr. Yong Channel"   Recommended follow up: Return in about 14 weeks (around 07/28/2022) for physical or sooner if needed.Schedule b4 you leave.

## 2022-04-21 NOTE — Progress Notes (Signed)
Phone [phone] In person visit   Subjective:   [name] is a 52 y.o. year old very pleasant male patient who presents for/with See problem oriented charting Chief Complaint  Patient presents with   Follow-up    Pt is here for 14 week f/u   Hyperlipidemia   Diabetes    Past Medical History-  Patient Active Problem List   Diagnosis Date Noted   Morbid obesity 03/08/2015    Priority: High   Type II diabetes mellitus with neurological manifestations 03/07/2015    Priority: High   Diabetic retinopathy 01/25/2018    Priority: Medium    Diabetic neuropathy 11/17/2016    Priority: Medium    Elevated LFTs     Priority: Medium    Hyperlipidemia associated with type 2 diabetes mellitus 03/08/2015    Priority: Medium    Essential hypertension 03/08/2015    Priority: Medium    Charcot's joint of foot, right 11/18/2019    Priority: Low   B12 deficiency 11/17/2016    Priority: Low   Iron deficiency anemia 11/17/2016    Priority: Low   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 11/17/2016    Priority: Low   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 11/17/2016    Priority: Low   Melena and acute on chronic blood loss anemia 11/07/2021   Pituitary adenoma 11/07/2021   PVD (peripheral vascular disease) 11/07/2021   Symptomatic anemia 11/06/2021   Ulcer of right foot with necrosis of bone    Osteomyelitis 08/31/2020   Diabetic foot ulcer 11/18/2019   Pyogenic inflammation of bone 11/09/2019   Osteomyelitis of ankle or foot, acute, right 11/09/2019   Diabetic foot infection 07/29/2016    Medications- reviewed and updated Current Outpatient Medications  Medication Sig Dispense Refill   ascorbic acid (VITAMIN C) 500 MG tablet Take 500 mg by mouth daily.     aspirin EC 81 MG tablet Take 81 mg by mouth daily.     atorvastatin (LIPITOR) 80 MG tablet Take 1 tablet (80 mg total) by mouth daily. 90 tablet 3   Cyanocobalamin  (VITAMIN B-12) 5000 MCG SUBL Place 5,000 mcg under the tongue daily.     fenofibrate (TRICOR) 48 MG tablet TAKE 1 TABLET BY MOUTH EVERY DAY (Patient taking differently: Take 48 mg by mouth daily.) 90 tablet 4   glucose blood (ACCU-CHEK GUIDE) test strip Dx E11.49 - Use to check blood glucose three times daily or as directed. 300 each 1   hydrochlorothiazide (MICROZIDE) 12.5 MG capsule TAKE 1 CAPSULE BY MOUTH EVERY DAY IN THE MORNING (Patient taking differently: Take 12.5 mg by mouth daily. TAKE 1 CAPSULE BY MOUTH EVERY DAY IN THE MORNING) 100 capsule 3   insulin aspart (NOVOLOG) 100 UNIT/ML FlexPen Inject 15-25 Units into the skin 3 (three) times daily with meals. Please provide pen needles if possible (Patient taking differently: Inject 15-25 Units into the skin 3 (three) times daily with meals. Per sliding scale) 45 mL 3   Insulin Syringe-Needle U-100 (BD INSULIN SYRINGE U/F) 31G X 5/16" 0.5 ML MISC Use to inject insulin 4 times daily. DX:E11.9 100 each 4   Insulin Syringe-Needle U-100 (BD INSULIN SYRINGE U/F) 31G X 5/16" 1 ML MISC Use to inject tresiba/long acting insulin once daily 100 each 3   Insulin Syringe-Needle U-100 (BD VEO INSULIN SYR U/F 1/2UNIT) 31G X 15/64" 0.3 ML MISC USE TO INJECT INSULIN FOUR TIMES DAILY AS DIRECTED 300  each 2   JANUVIA 50 MG tablet TAKE 1 TABLET BY MOUTH EVERY DAY IN THE MORNING (Patient taking differently: Take 50 mg by mouth daily.) 90 tablet 1   Lancets (ONETOUCH ULTRASOFT) lancets Use to test blood sugars daily. Dx: E11.9 100 each 12   Lancets MISC USED TO CHECK BLOOD GLUCOSE 3-4 TIMES DAILY 200 each 3   Multiple Vitamin (MULTIVITAMIN WITH MINERALS) TABS tablet Take 1 tablet by mouth 2 (two) times daily.     Omega-3 Fatty Acids (FISH OIL) 1360 MG CAPS Take 1,360 mg by mouth daily.      pyridoxine (B-6) 100 MG tablet Take 100 mg by mouth 2 (two) times daily.     ramipril (ALTACE) 5 MG capsule TAKE 1 CAPSULE BY MOUTH EVERY DAY IN THE MORNING (Patient taking  differently: Take 5 mg by mouth daily.) 90 capsule 4   insulin degludec (TRESIBA FLEXTOUCH) 200 UNIT/ML FlexTouch Pen Inject 80-100 Units into the skin daily. 6 mL 5   No current facility-administered medications for this visit.     Objective:  BP 132/82   Pulse 76   Temp 97.9 F (36.6 C)   Ht 6' (1.829 m)   Wt (!) 309 lb (140.2 kg)   SpO2 96%   BMI 41.91 kg/m  Gen: NAD, resting comfortably CV: RRR no murmurs rubs or gallops Lungs: CTAB no crackles, wheeze, rhonchi Abdomen: soft/nontender/nondistended/normal bowel sounds. No rebound or guarding.  Ext: trace edema left, walking boot on right and foot casted as well Skin: warm, dry    Assessment and Plan    #diabetic wound- healing with wound center. Finished hyperbaric treatments in November. Reports getting shallower. In cast and that may be affecting sugar levls some and is tough on him mentally- work boots were ripping area back open   #Diabetes-typically with A1c at least under 7.5 S: Medication: Tresiba at night - needing 100 units now at times, NovoLog 25 units twice daily -Take Januvia 50 mg in the morning.  -last visit was having to let sugars run higher for hyeprbaric treatments- sugar had to be at least 130 in am to allow for chamber- is trending down now CBGs- sugars probably averaging around 130 at home- rarealy gets lows- only had one in last month (ate less that day) Lab Results  Component Value Date   HGBA1C 7.5 (H) 11/11/2021   HGBA1C 6.8 (H) 05/15/2021   HGBA1C 6.5 (H) 08/31/2020  A/P: suspect improving- update a1c- continue current medications for now  -inactivity due to cast could raise #s some  # Hyperlipidemia-Ideal LDL under 70-tolerate under 100 S:Medication: Atorvastatin 60 mg daily (1 and 1/2 tablet of 40 mg) along with fenofibrate 48 mg - also prefers to take aspirin for primary prevention  Lab Results  Component Value Date   CHOL 169 05/15/2021   HDL 41.80 05/15/2021   LDLCALC 111 (H)  05/15/2021   LDLDIRECT 37.0 11/11/2021   TRIG 79.0 05/15/2021   CHOLHDL 4 05/15/2021  A/P: LDL looked great last visit- update with full panel today   #Hypertension S: HCTZ 12.5 mg and Ramipril 5 mg  Home readings #s: not checking much BP Readings from Last 3 Encounters:  04/21/22 132/82  01/30/22 131/84  12/16/21 130/82  A/P: stable- continue current medicines   #Diabetic foot wound-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 Still takes his baseline iron Lab Results  Component Value Date   WBC 10.1 12/03/2021   HGB 11.0 (L) 12/03/2021   HCT 33.4 (L) 12/03/2021   MCV 80.6 12/03/2021   PLT 298.0 12/03/2021   Recommended follow up: Return in about 14 weeks (around 07/28/2022) for physical or sooner if needed.Schedule b4 you leave. Future Appointments  Date Time Provider [place]  04/28/2022 10:00 AM [name], MD [facility]  05/06/2022 10:00 AM [name], MD [facility]  05/08/2022 10:45 AM Posey Pronto Thomasene Lot, DPM [facility]    Lab/Order associations: glucerna this am only    ICD-10-CM   1. Type II diabetes mellitus with neurological manifestations  E11.49 CBC with Differential/Platelet    Comprehensive metabolic panel    Lipid panel    Hemoglobin A1c    2. 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 Essential hypertension  I10     5. Hyperlipidemia associated with type 2 diabetes mellitus  E11.69    E78.5       Meds ordered this encounter  Medications   insulin degludec (TRESIBA FLEXTOUCH) 200 UNIT/ML FlexTouch Pen    Sig: Inject 80-100 Units into the skin daily.    Dispense:  6 mL    Refill:  5    Return precautions advised.   [name], MD

## 2022-04-22 LAB — CBC WITH DIFFERENTIAL/PLATELET
Basophils Absolute: 0.1 10*3/uL (ref 0.0–0.1)
Basophils Relative: 0.5 % (ref 0.0–3.0)
Eosinophils Absolute: 0.2 10*3/uL (ref 0.0–0.7)
Eosinophils Relative: 1.7 % (ref 0.0–5.0)
HCT: 42.1 % (ref 39.0–52.0)
Hemoglobin: 13.6 g/dL (ref 13.0–17.0)
Lymphocytes Relative: 25 % (ref 12.0–46.0)
Lymphs Abs: 2.7 10*3/uL (ref 0.7–4.0)
MCHC: 32.2 g/dL (ref 30.0–36.0)
MCV: 78.2 fl (ref 78.0–100.0)
Monocytes Absolute: 0.6 10*3/uL (ref 0.1–1.0)
Monocytes Relative: 5.1 % (ref 3.0–12.0)
Neutro Abs: 7.4 10*3/uL (ref 1.4–7.7)
Neutrophils Relative %: 67.7 % (ref 43.0–77.0)
Platelets: 371 10*3/uL (ref 150.0–400.0)
RBC: 5.39 Mil/uL (ref 4.22–5.81)
RDW: 14.5 % (ref 11.5–15.5)
WBC: 11 10*3/uL — ABNORMAL HIGH (ref 4.0–10.5)

## 2022-04-22 LAB — COMPREHENSIVE METABOLIC PANEL
ALT: 47 U/L (ref 0–53)
AST: 30 U/L (ref 0–37)
Albumin: 4.7 g/dL (ref 3.5–5.2)
Alkaline Phosphatase: 92 U/L (ref 39–117)
BUN: 25 mg/dL — ABNORMAL HIGH (ref 6–23)
CO2: 28 mEq/L (ref 19–32)
Calcium: 10.9 mg/dL — ABNORMAL HIGH (ref 8.4–10.5)
Chloride: 99 mEq/L (ref 96–112)
Creatinine, Ser: 1.46 mg/dL (ref 0.40–1.50)
GFR: 55.14 mL/min — ABNORMAL LOW (ref >60.00)
Glucose, Bld: 104 mg/dL — ABNORMAL HIGH (ref 70–99)
Potassium: 4.1 mEq/L (ref 3.5–5.1)
Sodium: 136 mEq/L (ref 135–145)
Total Bilirubin: 1.2 mg/dL (ref 0.2–1.2)
Total Protein: 8.9 g/dL — ABNORMAL HIGH (ref 6.0–8.3)

## 2022-04-22 LAB — LIPID PANEL
Cholesterol: 176 mg/dL (ref 0–200)
HDL: 44.6 mg/dL (ref >39.00)
LDL Cholesterol: 107 mg/dL — ABNORMAL HIGH (ref 0–99)
NonHDL: 131.76
Total CHOL/HDL Ratio: 4
Triglycerides: 126 mg/dL (ref 0.0–149.0)
VLDL: 25.2 mg/dL (ref 0.0–40.0)

## 2022-04-22 LAB — HEMOGLOBIN A1C: Hgb A1c MFr Bld: 8.4 % — ABNORMAL HIGH (ref 4.6–6.5)

## 2022-04-22 NOTE — Progress Notes (Signed)
[name], [name] ([id number]) [id number] Page 1 of 10 Visit Report for 04/21/2022 Chief Complaint Document Details Patient Name: Date of Service: [name], [name] 04/21/2022 10:00 A M Medical Record Number: [id number] Patient Account Number: [id number] Date of Birth/Sex: Treating RN: [birth year] (52 y.o. M) Primary Care Provider: [name] Other Clinician: Referring Provider: Treating Provider/Extender: [name] in Treatment: 41 Information Obtained from: Patient Chief Complaint Patients presents for treatment of an open diabetic ulcer Electronic Signature(s) Signed: 04/21/2022 10:39:46 AM By: [name] MD FACS Entered By: [name] on 04/21/2022 10:39:46 -------------------------------------------------------------------------------- Cellular or Tissue Based Product Details Patient Name: Date of Service: [name], [name] 04/21/2022 10:00 A M Medical Record Number: [id number] Patient Account Number: [id number] Date of Birth/Sex: Treating RN: 09-14-1970 (52 y.o. [name] Primary Care Provider: [name] Other Clinician: Referring Provider: Treating Provider/Extender: [name] in Treatment: 41 Cellular or Tissue Based Product Type Wound #2 Right,Lateral,Plantar Foot Applied to: Performed By: Physician [name], MD Cellular or Tissue Based Product Type: Epicord Level of Consciousness (Pre-procedure): Awake and Alert Pre-procedure Verification/Time Out Yes - 10:06 Taken: Location: genitalia / hands / feet / multiple digits Wound Size (sq cm): 0.01 Product Size (sq cm): 6 Waste Size (sq cm): 0 Amount of Product Applied (sq cm): 6 Instrument Used: Curette Lot #: [id number] Expiration Date: 10/21/2026 Fenestrated: No Reconstituted: Yes Solution Type: normal saline Solution Amount: 2 ml Lot #: [id number] Solution Expiration Date:  06/19/2024 Secured: Yes Secured With: Steri-Strips Dressing Applied: Yes Primary Dressing: adaptic Procedural Pain: 0 Post Procedural Pain: 0 Response to Treatment: Procedure was tolerated well Level of Consciousness (Post- Awake and Alert procedure): Post Procedure Diagnosis Same as Pre-procedure Notes scribed for [name] by Adline Peals, RN Electronic Signature(s) [name], [place] ([id number]) [phone].pdf Page 2 of 10 Signed: 04/21/2022 10:56:24 AM By: [name] MD FACS Signed: 04/21/2022 4:49:56 PM By: Sabas Sous By: Adline Peals on 04/21/2022 10:08:04 -------------------------------------------------------------------------------- HPI Details Patient Name: Date of Service: [name], [name] 04/21/2022 10:00 A M Medical Record Number: [id number] Patient Account Number: [id number] Date of Birth/Sex: Treating RN: 1970/09/09 (52 y.o. 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 The most recent amputation was in August 2022. At that operation, antibiotic beads were placed in the wound. He has been managed by podiatry for his procedures and management of his wounds. He has been in a Water engineer. He is on oral doxycycline. They have been using Betadine wet to dry dressings along with Iodosorb. The patient states that when he thinks the wound is getting too dry, he applies topical Neosporin. At his last visit, on June 7 of this year, the podiatrist determined that he felt the wound was stalled and referred him to wound care for additional evaluation and management.  An MRI has been ordered, but not yet scheduled or performed. Pathology from his operation in August 2022 demonstrated findings consistent with chronic osteomyelitis. Today, there is a large irregular wound on the plantar surface of his right foot, at about the level of the fifth metatarsal head. This tracks through to a pinpoint opening on the dorsal lateral portion of his  foot. The intake nurse reported purulent drainage. There is some malodor from the wound. No frank necrosis identified. 07/11/2021: Today, the wounds do not connect. I attempted multiple times from various directions and the shared tunnel is no longer open. He has some slough accumulation on the dorsal part of his foot as well as slough and callus buildup on the plantar surface. His MRI is scheduled for June 29. No purulent drainage or malodor appreciated today. 07/19/2021: The lateral foot wound has closed and there is no tunnel connecting the plantar foot wound to that site. The plantar foot wound still probes quite deeply, however. There is some slough, eschar, and nonviable tissue accumulated in the wound bed. No malodorous drainage present. His MRI was performed yesterday and is consistent with fairly extensive osteomyelitis. 07/29/2021: The patient has an appointment with infectious disease on July 18 to treat his osteomyelitis. The plantar wound still probes quite deeply, approaching bone. The orifice has narrowed quite substantially, however, making it more difficult for him to pack. 08/05/2021: The tunnel connecting the lateral foot wound to the plantar foot wound has reopened. He sees infectious disease tomorrow to discuss long-term antibiotic treatment for osteomyelitis. There was a bit of murky drainage in the wound, but this was noted after he had had topical lidocaine applied so may have just been a blob of the anesthetic. No odor or frank purulent drainage. The wound probes deeply at the midfoot approaching bone. He does  have MRI results confirming his diagnosis of osteomyelitis. He has failed to progress with conventional treatment and I think his best chance for preservation of the foot is to initiate hyperbaric oxygen therapy. 08/13/2021: The tunnel connecting the 2 wounds has closed again. He has a PICC line and is getting IV daptomycin and cefepime. EKG and chest x-ray are within normal limits. He is scheduled to start hyperbaric oxygen therapy tomorrow. The wound in his midfoot probes deeply, approaching bone. The skin at the orifice continues to heaped up and threatens to close over despite the large cavity within. No erythema, induration, or purulent drainage. The wound on his lateral foot is fairly small and quite clean. 08/19/2021: The lateral foot wound has nearly closed. The wound in his midfoot does not probe quite as deeply today. The skin at the orifice continues to try to roll in and obscure the opening. No frankly necrotic tissue appreciated. He is tolerating hyperbaric oxygen therapy. 08/26/2021: The lateral foot wound has closed completely. The wound in his midfoot is shallower again today. The wound orifice is contracting. We are using gentamicin and silver alginate. He continues on IV daptomycin and cefepime and is tolerating hyperbaric oxygen therapy without difficulty. 09/02/2021: His foot is a little bit sore today but he was up walking on it all weekend doing back to school shopping with his daughter. The tunneling is down to about 3 cm and I do not appreciate bone at the tip of the probe. The wound is clean but has some callus creating an overhanging lip at the distal aspect. He continues to receive hyperbaric oxygen therapy as well as IV daptomycin and cefepime. 09/09/2021: His wound continues to improve. The tunnel has come in by 0.9 cm. He continues to form callus around the orifice of the wound. He is tolerating hyperbaric oxygen therapy along with his IV antibiotics. 09/16/2021: The tunneling  is down to just half a centimeter. He continues to build up callus around the orifice of the wound. He completed his course of IV  antibiotics and his PICC line is scheduled to be removed this Friday. He is tolerating hyperbaric oxygen without difficulty. We have been applying topical gentamicin and silver alginate to his wound. 09/24/2021: The wound depth has come in even further. There is a little bit of callus buildup around the orifice. The opening is quite narrow, at this point. 09/30/2021: The wound depth is about the same this week. The opening continues to contract with callus accumulation. There is some discoloration of the skin on the lateral part of his foot and he admitted to walking more than usual this weekend and also wearing a different pair shoes. He continues to tolerate hyperbaric oxygen therapy without difficulty. 10/07/2021: The wound depth has contracted a bit and is now approximately 1 to 1.5 cm. The orifice of the wound continues to try and close in over the space. There is just some callus and skin around the margins obscuring the orifice. 10/14/2021: The wound depth has come in a little bit more. The orifice of the wound is rolling inwards with epithelium and callus, making it difficult to pack the wound. 10/21/2021: There is still 1 portion of the wound, at the lateral aspect, that is still a couple of centimeters deep. The architecture of the wound makes this somewhat challenging to access and pack. Everything else looks clean. He has his usual accumulation of callus and skin, narrowing the orifice. [name], [place] ([id number]) [id number] Page 3 of 10 10/29/2021: No significant change to the depth of the wound. The orifice continues to contract with callus and skin narrowing the opening. His wife has been packing the wound and doing an excellent job. 11/05/2021: The depth of the wound has come in by about a centimeter. There is less callus accumulation  around the orificedespite this, the wound is narrower today. We are still awaiting insurance approval for Kerecis powder. 11/12/2021: The depth of the wound continues to contract. He has accumulated some callus around the orifice, as usual. We have received a verbal confirmation that he is approved for Kerecis, but no formal paperwork has yet been received. 11/19/2021: The depth continues to come in. There is callus accumulation around the orifice, as usual. He has been formally approved for Kerecis, but unfortunately we do not have a billing mechanism for the powder in [employer] yet. We are working to address this so we can begin treatment. 11/25/2021: Continued filling in of the depth of the wound. Continued buildup of callus around the orifice. No concern for infection. As we do not have a way to bill for Kerecis powder, but are capable of billing for the [facility] sheet, we are going to use that on him instead. 12/03/2021: The depth of the wound continues to fill in. As usual, he has accumulated callus and skin around the orifice of the wound. No concern for infection. He will complete his hyperbaric oxygen therapy this week. 12/17/2021: The wound depth continues to contract. There is callus and skin accumulation around the orifice of the wound, as per usual. There is more epithelium on the actual wound surface. Unfortunately, his Leroy Kennedy was not ordered. 12/24/2021: The wound depth has come in a little bit more this week. There is less skin accumulation around the orifice, but still some callus. On questioning, he admits to spending a fair amount of time on his feet. 12/12; this is a very difficult wound to assess size slitlike wound with some depth but minimal opening. I applied Kerecis No. 4 12/18; Kerecis No. 5 01/15/2022:  His wound measured deeper today. There is also some evidence of pressure induced tissue injury around the lateral aspect of his foot. The patient reports that he was wearing  different shoes than usual and felt like his foot was rolling a lot over the weekend. There is heavy callus around the orifice of the wound. No concern for infection. 01/21/2022: The depth decreased today from 1.7 to 1.0 cm. Significantly less tissue damage as compared to last week. Still with callus and skin accumulation around the orifice. 01/29/2022: The wound continues to contract and the depth is coming in further. We are still working on getting [employer] for him. 02/04/2022: No real change this week. Still working on the [employer]. 02/11/2022: No significant changes again. We are still working on the [employer]. 02/18/2022: No change to the wound. We have submitted in IVR for [place]. 02/25/2022: Unfortunately, his wound has deteriorated over the past week. It is deeper and is approaching bone once again. There is more slough accumulation and the tissue shows signs of pressure injury. He says that he has been trying to wear his new work boots, which are waterproof. He says they make his feet sweat excessively. I think our main challenge with him is that we just are not able to adequately offload him while he is still working and on his feet through much of the day. He is willing to consider total contact casting, but will need to make some arrangements with work. On a more positive note, however, he has been approved for Epicord. 03/03/2022: The wound probed a little bit deeper again today. We are planning to put him in a total contact cast as soon as he has cleared it with work to take some time off. 03/10/2022: The wound was a little bit shallower today and the overall appearance is significantly improved. We are planning to put him in a cast today. 03/12/2022: He is here for his first total contact cast change. He denies any issues with pressure, rubbing, or pain. 03/17/2022: For some reason, the wound probes deeper today. There is some slough and nonviable subcutaneous tissue around  the opening. He denies any difficulty with the total contact cast. 03/24/2022: The wound is shallower today. There is better granulation tissue visible on the wound surface. 03/31/2022: His cast protector got a hole in it and his cast got wet. His foot is macerated. The wound measured a little deeper today. There is some hypertrophic granulation tissue at the orifice of his wound. 3/18; patient presents for follow-up. He had no issues with the total contact cast. We have been using epi cord to the wound bed. Overall wound is stable. Patient denies signs of infection. 04/14/2022: The depth of the wound has rather remarkably decreased to 1.3 cm today. The orifice of the wound has also contracted quite a bit. 04/21/2022: The depth of the wound has decreased even further. The orifice is also smaller. The wound is very clean. Electronic Signature(s) Signed: 04/21/2022 10:40:20 AM By: [name] MD FACS Entered By: [name] on 04/21/2022 10:40:20 -------------------------------------------------------------------------------- Physical Exam Details Patient Name: Date of Service: [name], [name] 04/21/2022 10:00 A M Medical Record Number: [id number] Patient Account Number: [id number] Date of Birth/Sex: Treating RN: 12/02/1970 (52 y.o. M) Primary Care Provider: [name] Other Clinician: Referring Provider: Treating Provider/Extender: [name] in Treatment: 41 [name], [name] ([id number]) [id number] Page 4 of 10 Constitutional . . . . no acute distress. Respiratory Normal work  of breathing on room air. Notes 04/21/2022: The depth of the wound has decreased even further. The orifice is also smaller. The wound is very clean. Electronic Signature(s) Signed: 04/21/2022 10:41:01 AM By: [name] MD FACS Entered By: [name] on 04/21/2022  10:41:01 -------------------------------------------------------------------------------- Physician Orders Details Patient Name: Date of Service: [name], [name] 04/21/2022 10:00 A M Medical Record Number: [id number] Patient Account Number: [id number] Date of Birth/Sex: Treating RN: 01/13/1971 (52 y.o. 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 - Dr Celine Ahr RM 2 Return A Anesthetic Wound #2 Right,Lateral,Plantar Foot (In clinic) Topical Lidocaine 4% applied to wound bed Cellular or Tissue Based Products Cellular or Tissue Based Product Type: - Epicord #9 Cellular or Tissue Based Product applied to wound bed, secured with steri-strips, cover with Adaptic or Mepitel. (DO NOT REMOVE). Bathing/ Shower/ Hygiene May shower with protection but do not get wound dressing(s) wet. Protect dressing(s) with water repellant cover (for example, large plastic bag) or a cast cover and may then take shower. Off-Loading Total Contact Cast to Right Lower Extremity Other: - Try to not stand on your feet too much, minimize walking Wound Treatment Wound #2 - Foot Wound Laterality: Plantar, Right, Lateral Cleanser: Soap and Water 1 x Per Week/30 Days Discharge Instructions: May shower and wash wound with dial antibacterial soap and water prior to dressing change. Cleanser: Wound Cleanser (Generic) 1 x Per Week/30 Days Discharge Instructions: Cleanse the wound with wound cleanser prior to applying a clean dressing using gauze  sponges, not tissue or cotton balls. Peri-Wound Care: Zinc Oxide Ointment 30g tube 1 x Per Week/30 Days Discharge Instructions: Apply Zinc Oxide to periwound with each dressing change Peri-Wound Care: cotton tipped applicators (Generic) 1 x Per Week/30 Days Prim Dressing: Epicord ary 1 x Per Week/30 Days Secondary Dressing: ADAPTIC TOUCH 3x4.25 in 1 x Per Week/30 Days Discharge Instructions: Apply over primary dressing as directed. Secondary Dressing: Optifoam Non-Adhesive Dressing, 4x4 in 1 x Per Week/30 Days Discharge Instructions: Apply over primary dressing as directed. [name], [facility] [place] ([id number]) [id number] Page 5 of 10 Secondary Dressing: Woven Gauze Sponges 2x2 in 1 x Per Week/30 Days Discharge Instructions: moistened with saline. Apply over primary dressing as directed. Secured With: Paper Tape, 2x10 (in/yd) (Generic) 1 x Per Week/30 Days Discharge Instructions: Secure dressing with tape as directed. Compression Wrap: Kerlix Roll 4.5x3.1 (in/yd) (Generic) 1 x Per Week/30 Days Discharge Instructions: Apply Kerlix and Coban compression as directed. Patient Medications llergies: No Known Allergies A Notifications Medication Indication Start End 04/21/2022 lidocaine DOSE topical 4 % cream - cream topical Electronic Signature(s) Signed: 04/21/2022 10:56:24 AM By: [name] MD FACS Entered By: [name] on 04/21/2022 10:41:14 -------------------------------------------------------------------------------- Problem List Details Patient Name: Date of Service: [name], [name] 04/21/2022 10:00 A M Medical Record Number: [id number] Patient Account Number: [id number] Date of Birth/Sex: Treating RN: [birth year] (52 y.o. 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 04/21/2022 10:00 A M Medical Record Number: [id number] Patient Account Number: [id number] Date of Birth/Sex: Treating RN: [birth year] (52 y.o. 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 The most recent amputation was in August 2022. At that operation, antibiotic beads were placed in the wound. He has been managed by podiatry for his procedures and management of his wounds. He has been in a Water engineer. He is on oral doxycycline. They have been using Betadine wet to dry dressings along with Iodosorb. The patient states that when he thinks the wound is getting too dry, he applies topical Neosporin. At his last visit, on June 7 of this year, the podiatrist determined that he felt the wound was stalled and referred him to wound care for  additional evaluation and management. An MRI has been ordered, but not yet scheduled or performed. Pathology from his operation in August 2022 demonstrated findings consistent with chronic osteomyelitis. Today, there is a large irregular wound on the plantar surface of his right foot, at about the level of the fifth metatarsal head. This tracks through to a pinpoint opening on the dorsal lateral portion of his foot. The intake nurse reported purulent drainage. There is some malodor from the wound. No frank necrosis identified. 07/11/2021: Today, the wounds do not connect. I attempted multiple times from various directions and the shared tunnel is no longer open. He has some slough accumulation on the dorsal part of his foot as well as slough and callus buildup on the plantar surface. His MRI is scheduled for June 29. No purulent drainage or malodor appreciated today. 07/19/2021: The lateral foot wound has closed and there is no tunnel connecting the plantar foot wound to that site. The plantar foot wound still probes quite deeply, however. There is some slough, eschar, and nonviable tissue accumulated in the wound bed. No malodorous drainage present. His MRI was performed yesterday and is consistent with fairly extensive osteomyelitis. 07/29/2021: The patient has an appointment with infectious disease on July 18 to treat his osteomyelitis. The plantar wound still probes quite deeply, approaching bone. The orifice has narrowed quite substantially, however, making it more difficult for him to pack. 08/05/2021: The tunnel connecting the lateral foot wound to the plantar foot wound has reopened. He sees infectious disease tomorrow to discuss long-term antibiotic treatment for osteomyelitis. There was a bit of murky drainage in the wound, but this was noted after he had had topical lidocaine applied so may have just been a blob of the anesthetic. No odor or frank purulent drainage. The wound probes deeply at  the midfoot approaching bone. He does have MRI results confirming his diagnosis of osteomyelitis. He has failed to  progress with conventional treatment and I think his best chance for preservation of the foot is to initiate hyperbaric oxygen therapy. 08/13/2021: The tunnel connecting the 2 wounds has closed again. He has a PICC line and is getting IV daptomycin and cefepime. EKG and chest x-ray are within normal limits. He is scheduled to start hyperbaric oxygen therapy tomorrow. The wound in his midfoot probes deeply, approaching bone. The skin at the orifice continues to heaped up and threatens to close over despite the large cavity within. No erythema, induration, or purulent drainage. The wound on his lateral foot is fairly small and quite clean. 08/19/2021: The lateral foot wound has nearly closed. The wound in his midfoot does not probe quite as deeply today. The skin at the orifice continues to try to roll in and obscure the opening. No frankly necrotic tissue appreciated. He is tolerating hyperbaric oxygen therapy. 08/26/2021: The lateral foot wound has closed completely. The wound in his midfoot is shallower again today. The wound orifice is contracting. We are using gentamicin and silver alginate. He continues on IV daptomycin and cefepime and is tolerating hyperbaric oxygen therapy without difficulty. 09/02/2021: His foot is a little bit sore today but he was up walking on it all weekend doing back to school shopping with his daughter. The tunneling is down to about 3 cm and I do not appreciate bone at the tip of the probe. The wound is clean but has some callus creating an overhanging lip at the distal aspect. He continues to receive hyperbaric oxygen therapy as well as IV daptomycin and cefepime. 09/09/2021: His wound continues to improve. The tunnel has come in by 0.9 cm. He continues to form callus around the orifice of the wound. He is tolerating hyperbaric oxygen therapy along with his IV  antibiotics. 09/16/2021: The tunneling is down to just half a centimeter. He continues to build up callus around the orifice of the wound. He completed his course of IV antibiotics and his PICC line is scheduled to be removed this Friday. He is tolerating hyperbaric oxygen without difficulty. We have been applying topical gentamicin and silver alginate to his wound. 09/24/2021: The wound depth has come in even further. There is a little bit of callus buildup around the orifice. The opening is quite narrow, at this point. 09/30/2021: The wound depth is about the same this week. The opening continues to contract with callus accumulation. There is some discoloration of the skin on the lateral part of his foot and he admitted to walking more than usual this weekend and also wearing a different pair shoes. He continues to tolerate hyperbaric oxygen therapy without difficulty. 10/07/2021: The wound depth has contracted a bit and is now approximately 1 to 1.5 cm. The orifice of the wound continues to try and close in over the space. There is just some callus and skin around the margins obscuring the orifice. 10/14/2021: The wound depth has come in a little bit more. The orifice of the wound is rolling inwards with epithelium and callus, making it difficult to pack the wound. 10/21/2021: There is still 1 portion of the wound, at the lateral aspect, that is still a couple of centimeters deep. The architecture of the wound makes this somewhat challenging to access and pack. Everything else looks clean. He has his usual accumulation of callus and skin, narrowing the orifice. 10/29/2021: No significant change to the depth of the wound. The orifice continues to contract with callus and skin narrowing the opening. His  wife has been packing the wound and doing an excellent job. 11/05/2021: The depth of the wound has come in by about a centimeter. There is less callus accumulation around the orificeoodespite this, the  wound is narrower today. We are still awaiting insurance approval for Kerecis powder. 11/12/2021: The depth of the wound continues to contract. He has accumulated some callus around the orifice, as usual. We have received a verbal confirmation that he is approved for Kerecis, but no formal paperwork has yet been received. 11/19/2021: The depth continues to come in. There is callus accumulation around the orifice, as usual. He has been formally approved for Kerecis, but unfortunately we do not have a billing mechanism for the powder in [facility] yet. We are working to address this so we can begin treatment. 11/25/2021: Continued filling in of the depth of the wound. Continued buildup of callus around the orifice. No concern for infection. As we do not have a way to bill for Kerecis powder, but are capable of billing for the [facility] sheet, we are going to use that on him instead. [name], [name] ([id number]) [id number] Page 7 of 10 12/03/2021: The depth of the wound continues to fill in. As usual, he has accumulated callus and skin around the orifice of the wound. No concern for infection. He will complete his hyperbaric oxygen therapy this week. 12/17/2021: The wound depth continues to contract. There is callus and skin accumulation around the orifice of the wound, as per usual. There is more epithelium on the actual wound surface. Unfortunately, his Leroy Kennedy was not ordered. 12/24/2021: The wound depth has come in a little bit more this week. There is less skin accumulation around the orifice, but still some callus. On questioning, he admits to spending a fair amount of time on his feet. 12/12; this is a very difficult wound to assess size slitlike wound with some depth but minimal opening. I applied Kerecis No. 4 12/18; Kerecis No. 5 01/15/2022: His wound measured deeper today. There is also some evidence of pressure induced tissue injury around the lateral aspect of his foot.  The patient reports that he was wearing different shoes than usual and felt like his foot was rolling a lot over the weekend. There is heavy callus around the orifice of the wound. No concern for infection. 01/21/2022: The depth decreased today from 1.7 to 1.0 cm. Significantly less tissue damage as compared to last week. Still with callus and skin accumulation around the orifice. 01/29/2022: The wound continues to contract and the depth is coming in further. We are still working on getting [employer] for him. 02/04/2022: No real change this week. Still working on the [employer]. 02/11/2022: No significant changes again. We are still working on the [employer]. 02/18/2022: No change to the wound. We have submitted in IVR for [place]. 02/25/2022: Unfortunately, his wound has deteriorated over the past week. It is deeper and is approaching bone once again. There is more slough accumulation and the tissue shows signs of pressure injury. He says that he has been trying to wear his new work boots, which are waterproof. He says they make his feet sweat excessively. I think our main challenge with him is that we just are not able to adequately offload him while he is still working and on his feet through much of the day. He is willing to consider total contact casting, but will need to make some arrangements with work. On a more positive note, however, he has been  approved for Epicord. 03/03/2022: The wound probed a little bit deeper again today. We are planning to put him in a total contact cast as soon as he has cleared it with work to take some time off. 03/10/2022: The wound was a little bit shallower today and the overall appearance is significantly improved. We are planning to put him in a cast today. 03/12/2022: He is here for his first total contact cast change. He denies any issues with pressure, rubbing, or pain. 03/17/2022: For some reason, the wound probes deeper today. There is some slough  and nonviable subcutaneous tissue around the opening. He denies any difficulty with the total contact cast. 03/24/2022: The wound is shallower today. There is better granulation tissue visible on the wound surface. 03/31/2022: His cast protector got a hole in it and his cast got wet. His foot is macerated. The wound measured a little deeper today. There is some hypertrophic granulation tissue at the orifice of his wound. 3/18; patient presents for follow-up. He had no issues with the total contact cast. We have been using epi cord to the wound bed. Overall wound is stable. Patient denies signs of infection. 04/14/2022: The depth of the wound has rather remarkably decreased to 1.3 cm today. The orifice of the wound has also contracted quite a bit. 04/21/2022: The depth of the wound has decreased even further. The orifice is also smaller. The wound is very clean. Patient History Information obtained from Patient. Social History Never smoker, Marital Status - Married, Alcohol Use - Never, Drug Use - No History, Caffeine Use - Daily - T coffee. ea; Medical History Endocrine Patient has history of Type II Diabetes Hospitalization/Surgery History - Amuptation of 3rd,4th and 5th toes of Right foot;Oral Surgery;Anal Fissure surgery; Cholecystectomy. Objective Constitutional no acute distress. Vitals Time Taken: 9:46 AM, Height: 72 in, Weight: 312 lbs, BMI: 42.3, Temperature: 98.7 F, Pulse: 80 bpm, Respiratory Rate: 16 breaths/min, Blood Pressure: 139/83 mmHg, Capillary Blood Glucose: 161 mg/dl. Respiratory Normal work of breathing on room air. [name], Jae [place] ([id number]) [id number] Page 8 of 10 General Notes: 04/21/2022: The depth of the wound has decreased even further. The orifice is also smaller. The wound is very clean. Integumentary (Hair, Skin) Wound #2 status is Open. Original cause of wound was Gradually Appeared. The date acquired was: 12/19/2016. The wound has  been in treatment 41 weeks. The wound is located on the [place]. The wound measures 0.1cm length x 0.1cm width x 1.1cm depth; 0.008cm^2 area and 0.009cm^3 volume. There is no tunneling or undermining noted. There is a none present amount of drainage noted. The wound margin is thickened. There is no granulation within the wound bed. There is a large (67-100%) amount of necrotic tissue within the wound bed including Eschar. The periwound skin appearance had no abnormalities noted for color. The periwound skin appearance exhibited: Callus, Maceration. Periwound temperature was noted as No Abnormality. 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 A skin graft procedure using a bioengineered skin substitute/cellular or tissue based product was performed by [name], MD with the following instrument(s): Curette. Epicord was applied and secured with Steri-Strips. 6 sq cm of product was utilized and 0 sq cm was wasted. Post Application, adaptic  was applied. A Time Out was conducted at 10:06, prior to the start of the procedure. The procedure was tolerated well with a pain level of 0 throughout and a pain level of 0 following the procedure. Post procedure Diagnosis Wound #2: Same as Pre-Procedure General Notes: scribed for [name] by Adline Peals, RN. Pre-procedure diagnosis of Wound #2 is a Diabetic Wound/Ulcer of the Lower Extremity located on the Right,Lateral,Plantar Foot . There was a T Contact otal Cast Procedure by [name], MD. Post procedure Diagnosis Wound #2: Same as Pre-Procedure Notes: scribed for [name] by Adline Peals, RN. Plan Follow-up Appointments: Return Appointment in 1 week. - Dr Celine Ahr RM 2 Anesthetic: Wound #2 Right,Lateral,Plantar Foot: (In clinic) Topical Lidocaine 4% applied to wound bed Cellular or Tissue Based Products: Cellular or Tissue Based Product Type: - Epicord #9 Cellular or Tissue Based Product applied to wound bed, secured with steri-strips, cover with Adaptic or Mepitel. (DO NOT REMOVE). Bathing/ Shower/ Hygiene: May shower with protection but do not get wound dressing(s) wet. Protect dressing(s) with water repellant cover (for example, large plastic bag) or a cast cover and may then take shower. Off-Loading: T Contact Cast to Right Lower Extremity otal Other: - Try to not stand on your feet too much, minimize walking The following medication(s) was prescribed: lidocaine topical 4 % cream cream topical was prescribed at facility WOUND #2: - Foot Wound Laterality: Plantar, Right, Lateral Cleanser: Soap and Water 1 x Per Week/30 Days Discharge Instructions: May shower and wash wound with dial antibacterial soap and water prior to dressing change. Cleanser: Wound Cleanser (Generic) 1 x Per Week/30 Days Discharge Instructions: Cleanse the wound with wound cleanser prior to applying a clean dressing using gauze sponges, not tissue or cotton balls. Peri-Wound Care: Zinc Oxide Ointment 30g tube 1 x Per Week/30 Days Discharge Instructions: Apply Zinc Oxide to periwound with each dressing change Peri-Wound Care: cotton tipped applicators (Generic) 1 x Per Week/30 Days Prim Dressing: Epicord 1 x Per Week/30 Days ary Secondary Dressing: ADAPTIC TOUCH 3x4.25 in 1 x Per Week/30 Days Discharge Instructions: Apply over primary dressing as directed. Secondary Dressing: Optifoam Non-Adhesive Dressing, 4x4 in 1 x Per Week/30 Days Discharge Instructions: Apply over primary dressing as directed. Secondary Dressing: Woven Gauze Sponges 2x2 in 1 x Per Week/30 Days Discharge  Instructions: moistened with saline. Apply over primary dressing as directed. Secured With: Paper T ape, 2x10 (in/yd) (Generic) 1 x Per Week/30 Days Discharge Instructions: Secure dressing with tape as directed. Com pression Wrap: Kerlix Roll 4.5x3.1 (in/yd) (Generic) 1 x Per Week/30 Days Discharge Instructions: Apply Kerlix and Coban compression as directed. [name], Bernd [place] ([id number]) [id number] Page 9 of 10 04/21/2022: The depth of the wound has decreased even further. The orifice is also smaller. The wound is very clean. The wound was prepared to receive the skin substitute. Epicord was reconstituted with saline and packed into the wound. It was secured with Adaptic and Steri- Strips. A gauze sponge was used as a bolster. A total contact cast was then applied in standard fashion. He will follow-up in 1 week. Electronic Signature(s) Signed: 04/21/2022 10:41:55 AM By: [name] MD FACS Entered By: [name] on 04/21/2022 10:41:55 -------------------------------------------------------------------------------- HxROS Details Patient Name: Date of Service: [name], [name] 04/21/2022 10:00 A M Medical Record Number: [id number] Patient Account Number: [id number] Date of Birth/Sex: Treating RN: April 04, 1970 (52 y.o. M) Primary Care Provider: [name] Other Clinician: Referring Provider: Treating Provider/Extender: [name]  in Treatment: 41 Information Obtained From Patient Endocrine Medical History: Positive for: Type II Diabetes Immunizations Pneumococcal Vaccine: Received Pneumococcal Vaccination: Yes Received Pneumococcal Vaccination On or After 60th Birthday: No Implantable Devices None Hospitalization / Surgery History Type of Hospitalization/Surgery Amuptation of 3rd,4th and 5th toes of Right foot;Oral Surgery;Anal Fissure surgery; Cholecystectomy Family and Social History Never smoker; Marital Status -  Married; Alcohol Use: Never; Drug Use: No History; Caffeine Use: Daily - T coffee; Financial Concerns: No; Food, ea; Clothing or Shelter Needs: No; Support System Lacking: No; Transportation Concerns: No Electronic Signature(s) Signed: 04/21/2022 10:56:24 AM By: [name] MD FACS Entered By: [name] on 04/21/2022 10:40:40 -------------------------------------------------------------------------------- Total Contact Cast Details Patient Name: Date of Service: [name], [name] 04/21/2022 10:00 A M Medical Record Number: [id number] Patient Account Number: [id number] Date of Birth/Sex: Treating RN: 07/10/1970 (51 y.o. [name] Primary Care Provider: [name] Other Clinician: Referring Provider: Treating Provider/Extender: [name] in Treatment: 62 T Contact Cast Applied for Wound Assessment: otal Wound #2 Right,Lateral,Plantar Foot Performed By: Physician [name], MD Post Procedure Diagnosis Same as Pre-procedure Notes scribed for [name] by Adline Peals, RN [name], [place] ([id number]) [phone].pdf Page 10 of 10 Electronic Signature(s) Signed: 04/21/2022 10:56:24 AM By: [name] MD FACS Signed: 04/21/2022 4:49:56 PM By: Sabas Sous By: Adline Peals on 04/21/2022 10:02:34 -------------------------------------------------------------------------------- SuperBill Details Patient Name: Date of Service: [name], [name] 04/21/2022 Medical Record Number: [id number] Patient Account Number: [id number] Date of Birth/Sex: Treating RN: Jul 02, 1970 (52 y.o. M) Primary Care Provider: [name] Other Clinician: Referring Provider: Treating Provider/Extender: [name] in Treatment: 41 Diagnosis Coding ICD-10 Codes Code Description [phone] Non-pressure chronic ulcer of other part of right foot with necrosis of bone E11.49 Type 2  diabetes mellitus with other diabetic neurological complication 123456 Other chronic osteomyelitis, right ankle and foot Facility Procedures : CPT4 Code: [id number] Description: [id number] Epicord 2cm x 3cm - per sqcm Modifier: Quantity: 6 : CPT4 Code: [id number] Description: [id number] - SKIN SUB GRAFT FACE/NK/HF/G ICD-10 Diagnosis Description L97.514 Non-pressure chronic ulcer of other part of right foot with necrosis of bone Modifier: Quantity: 1 : CPT4 Code: [id number] Description: [id number] - APPLY TOTAL CONTACT LEG CAST ICD-10 Diagnosis Description L97.514 Non-pressure chronic ulcer of other part of right foot with necrosis of bone Modifier: Quantity: 1 Physician Procedures : CPT4 Code Description Modifier [id number] - WC PHYS LEVEL 4 - EST PT ICD-10 Diagnosis Description L97.514 Non-pressure chronic ulcer of other part of right foot with necrosis of bone E11.49 Type 2 diabetes mellitus with other diabetic neurological  complication [id number] Other chronic osteomyelitis, right ankle and foot Quantity: 1 : [id number] - WC PHYS SKIN SUB GRAFT FACE/NK/HF/G ICD-10 Diagnosis Description L97.514 Non-pressure chronic ulcer of other part of right foot with necrosis of bone Quantity: 1 : [id number] - WC PHYS APPLY TOTAL CONTACT CAST ICD-10 Diagnosis Description L97.514 Non-pressure chronic ulcer of other part of right foot with necrosis of bone Quantity: 1 Electronic Signature(s) Signed: 04/21/2022 10:42:21 AM By: [name] MD FACS Entered By: [name] on 04/21/2022 10:42:21

## 2022-04-22 NOTE — Progress Notes (Signed)
[name], Jonh Moroni ([id number]) [id number] Page 1 of 7 Visit Report for 04/21/2022 Arrival Information Details Patient Name: Date of Service: [place] [name] 04/21/2022 10:00 A M Medical Record Number: [id number] Patient Account Number: [id number] Date of Birth/Sex: Treating RN: 24-Dec-1970 (52 y.o. [name] Primary Care Jood Retana: [name] Other Clinician: Referring Algis Lehenbauer: Treating Adisen Bennion/Extender: [name] in Treatment: 33 Visit Information History Since Last Visit Added or deleted any medications: No Patient Arrived: Ambulatory Any new allergies or adverse reactions: No Arrival Time: 09:45 Had a fall or experienced change in No Accompanied By: self activities of daily living that may affect Transfer Assistance: None risk of falls: Patient Identification Verified: Yes Signs or symptoms of abuse/neglect since last visito No Secondary Verification Process Completed: Yes Hospitalized since last visit: No Patient Requires Transmission-Based Precautions: No Implantable device outside of the clinic excluding No Patient Has Alerts: Yes cellular tissue based products placed in the center since last visit: Has Dressing in Place as Prescribed: Yes Has Footwear/Offloading in Place as Prescribed: Yes Right: T Contact Cast otal Pain Present Now: No Electronic Signature(s) Signed: 04/21/2022 4:49:56 PM By: Adline Peals Entered By: Adline Peals on 04/21/2022 09:45:34 -------------------------------------------------------------------------------- Encounter Discharge Information Details Patient Name: Date of Service: [place], [name] 04/21/2022 10:00 A M Medical Record Number: [id number] Patient Account Number: [id number] Date of Birth/Sex: Treating RN: 1971-01-07 (52 y.o. [name] Primary Care Dilara Navarrete: [name] Other Clinician: Referring Kherington Meraz: Treating Audryna Wendt/Extender:  [name] in Treatment: 56 Encounter Discharge Information Items Post Procedure Vitals Discharge Condition: Stable Temperature (F): 98.7 Ambulatory Status: Ambulatory Pulse (bpm): 80 Discharge Destination: Home Respiratory Rate (breaths/min): 16 Transportation: Private Auto Blood Pressure (mmHg): 139/83 Accompanied By: self Schedule Follow-up Appointment: Yes Clinical Summary of Care: Patient Declined Electronic Signature(s) Signed: 04/21/2022 4:49:56 PM By: Adline Peals Entered By: Adline Peals on 04/21/2022 10:21:15 -------------------------------------------------------------------------------- Lower Extremity Assessment Details Patient Name: Date of Service: [name], [name] 04/21/2022 10:00 A M Medical Record Number: [id number] Patient Account Number: [id number] Date of Birth/Sex: Treating RN: 1970/11/12 (52 y.o. [name] Primary Care Zenaida Tesar: [name] Other Clinician: Referring Sanders Manninen: Treating Deondray Ospina/Extender: [name] in Treatment: 41 Edema Assessment Left: [Left: Right] [name]: :] Assessed: [Left: No] [Right: No] Edema: [Left: N] [Right: o] Calf Left: Right: Point of Measurement: From Medial Instep 41 cm Ankle Left: Right: Point of Measurement: From Medial Instep 24.2 cm Vascular Assessment Pulses: Dorsalis Pedis Palpable: [Right:Yes] Electronic Signature(s) Signed: 04/21/2022 4:49:56 PM By: Adline Peals Entered By: Adline Peals on 04/21/2022 09:57:32 -------------------------------------------------------------------------------- Multi Wound Chart Details Patient Name: Date of Service: [name], [name] 04/21/2022 10:00 A M Medical Record Number: [id number] Patient Account Number: [id number] Date of Birth/Sex: Treating RN: [birth year] (52 y.o. M) Primary Care Jade Burright: [name] Other Clinician: Referring Kaelon Weekes: Treating Lariza Cothron/Extender:  [name] in Treatment: 41 Vital Signs Height(in): 72 Capillary Blood Glucose(mg/dl): 161 Weight(lbs): 312 Pulse(bpm): 80 Body Mass Index(BMI): 42.3 Blood Pressure(mmHg): 139/83 Temperature(F): 98.7 Respiratory Rate(breaths/min): 16 [2:Photos:] [N/A:N/A] Right, Lateral, Plantar Foot N/A N/A Wound Location: Gradually Appeared N/A N/A Wounding Event: Diabetic Wound/Ulcer of the Lower N/A N/A Primary Etiology: Extremity Type II Diabetes N/A N/A Comorbid History: 12/19/2016 N/A N/A Date Acquired: 1 N/A N/A Weeks of Treatment: Open N/A N/A Wound Status: No N/A N/A Wound Recurrence: 0.1x0.1x1.1 N/A N/A Measurements L x W x D (cm) 0.008 N/A N/A A (cm) : rea 0.009 N/A N/A  Volume (cm) : 99.80% N/A N/A % Reduction in A rea: 100.00% N/A N/A % Reduction in Volume: Grade 3 N/A N/A Classification: None Present N/A N/A Exudate A mount: Thickened N/A N/A Wound Margin: None Present (0%) N/A N/A Granulation A mount: Large (67-100%) N/A N/A Necrotic A mount: Eschar N/A N/A Necrotic Tissue: Fascia: No N/A N/A Exposed Structures: Fat Layer (Subcutaneous Tissue): No Tendon: No [name], Burlon M ([id number]) [id number] Page 3 of 7 Muscle: No Joint: No Bone: No Large (67-100%) N/A N/A Epithelialization: Callus: Yes N/A N/A Periwound Skin Texture: Maceration: Yes N/A N/A Periwound Skin Moisture: No Abnormalities Noted N/A N/A Periwound Skin Color: No Abnormality N/A N/A Temperature: Cellular or Tissue Based Product N/A N/A Procedures Performed: T Contact Cast otal Treatment Notes Wound #2 (Foot) Wound Laterality: Plantar, Right, Lateral Cleanser Soap and Water Discharge Instruction: May shower and wash wound with dial antibacterial soap and water prior to dressing change. Wound Cleanser Discharge Instruction: Cleanse the wound with wound cleanser prior to applying a clean dressing using gauze sponges,  not tissue or cotton balls. Peri-Wound Care Zinc Oxide Ointment 30g tube Discharge Instruction: Apply Zinc Oxide to periwound with each dressing change cotton tipped applicators Topical Primary Dressing Epicord Secondary Dressing ADAPTIC TOUCH 3x4.25 in Discharge Instruction: Apply over primary dressing as directed. Optifoam Non-Adhesive Dressing, 4x4 in Discharge Instruction: Apply over primary dressing as directed. Woven Gauze Sponges 2x2 in Discharge Instruction: moistened with saline. Apply over primary dressing as directed. Secured With Paper Tape, 2x10 (in/yd) Discharge Instruction: Secure dressing with tape as directed. Compression Wrap Kerlix Roll 4.5x3.1 (in/yd) Discharge Instruction: Apply Kerlix and Coban compression as directed. Compression Stockings Add-Ons Electronic Signature(s) Signed: 04/21/2022 10:39:37 AM By: [name] MD FACS Entered By: [name] on 04/21/2022 10:39:37 -------------------------------------------------------------------------------- Multi-Disciplinary Care Plan Details Patient Name: Date of Service: [name] 04/21/2022 10:00 A M Medical Record Number: [id number] Patient Account Number: [id number] Date of Birth/Sex: Treating RN: 1970-04-08 (52 y.o. [name] Primary Care Rawan Riendeau: [name] Other Clinician: Referring Lynanne Delgreco: Treating Jelissa Espiritu/Extender: [name] in Treatment: [place] reviewed with physician Active Inactive Nutrition [name], Kerron [id number] ([id number]) [id number] Page 4 of 7 Nursing Diagnoses: Impaired glucose control: actual or potential Potential for alteratiion in Nutrition/Potential for imbalanced nutrition Goals: Patient/caregiver will maintain therapeutic glucose control Date Initiated: 07/29/2021 Target Resolution Date: 05/23/2022 Goal Status: Active Interventions: Assess patient nutrition upon admission  and as needed per policy Provide education on elevated blood sugars and impact on wound healing Treatment Activities: Patient referred to Primary Care Physician for further nutritional evaluation : 07/29/2021 Notes: Wound/Skin Impairment Nursing Diagnoses: Impaired tissue integrity Goals: Patient/caregiver will verbalize understanding of skin care regimen Date Initiated: 07/04/2021 Target Resolution Date: 05/23/2022 Goal Status: Active Interventions: Assess ulceration(s) every visit Treatment Activities: Skin care regimen initiated : 07/04/2021 Notes: Electronic Signature(s) Signed: 04/21/2022 4:49:56 PM By: Adline Peals Entered By: Adline Peals on 04/21/2022 09:46:50 -------------------------------------------------------------------------------- Pain Assessment Details Patient Name: Date of Service: [name] 04/21/2022 10:00 A M Medical Record Number: [id number] Patient Account Number: [id number] Date of Birth/Sex: Treating RN: March 27, 1970 (52 y.o. [name] Primary Care Taresa Montville: [name] Other Clinician: Referring Kauri Garson: Treating Yexalen Deike/Extender: [name] in Treatment: 49 Active Problems Location of Pain Severity and Description of Pain Patient Has Paino No Site Locations Rate the pain. Current Pain Level: 0 Pain Management and Medication [name], Jervis [id number] ([id number]) [id number] Page 5 of 7 Current Pain Management: Electronic  Signature(s) Signed: 04/21/2022 4:49:56 PM By: Sabas Sous By: Adline Peals on 04/21/2022 09:46:18 -------------------------------------------------------------------------------- Patient/Caregiver Education Details Patient Name: Date of Service: [name] 4/1/2024andnbsp10:00 A M Medical Record Number: [id number] Patient Account Number: [id number] Date of Birth/Gender: Treating RN: [birth year] (52 y.o. [name] Primary Care Physician: [name] Other Clinician: Referring Physician: Treating Physician/Extender: [name] in Treatment: 80 Education Assessment Education Provided To: Patient Education Topics Provided Wound/Skin Impairment: Methods: Explain/Verbal Responses: Reinforcements needed, State content correctly Electronic Signature(s) Signed: 04/21/2022 4:49:56 PM By: Adline Peals Entered By: Adline Peals on 04/21/2022 09:47:22 -------------------------------------------------------------------------------- Wound Assessment Details Patient Name: Date of Service: [name] 04/21/2022 10:00 A M Medical Record Number: [id number] Patient Account Number: [id number] Date of Birth/Sex: Treating RN: 1970-11-13 (52 y.o. [name] Primary Care Johnni Wunschel: [name] Other Clinician: Referring Jennyfer Nickolson: Treating Johnathin Vanderschaaf/Extender: [name] in Treatment: 41 Wound Status Wound Number: 2 Primary Etiology: Diabetic Wound/Ulcer of the Lower Extremity Wound Location: Right, Lateral, Plantar Foot Wound Status: Open Wounding Event: Gradually Appeared Comorbid History: Type II Diabetes Date Acquired: 12/19/2016 Weeks Of Treatment: 41 Clustered Wound: No Photos Wound Measurements Length: (cm) 0.1 Width: (cm) 0.1 [name], Zeke M ([id number]) Depth: (cm) 1.1 Area: (cm) 0.008 Volume: (cm) 0.009 % Reduction in Area: 99.8% % Reduction in Volume: 100% [phone].pdf Page 6 of 7 Epithelialization: Large (67-100%) Tunneling: No Undermining: No Wound Description Classification: Grade 3 Wound Margin: Thickened Exudate Amount: None Present Foul Odor After Cleansing: No Slough/Fibrino No Wound Bed Granulation Amount: None Present (0%) Exposed Structure Necrotic Amount: Large (67-100%) Fascia Exposed: No Necrotic Quality: Eschar Fat Layer (Subcutaneous Tissue)  Exposed: No Tendon Exposed: No Muscle Exposed: No Joint Exposed: No Bone Exposed: No Periwound Skin Texture Texture Color No Abnormalities Noted: No No Abnormalities Noted: Yes Callus: Yes Temperature / Pain Temperature: No Abnormality Moisture No Abnormalities Noted: No Maceration: Yes Treatment Notes Wound #2 (Foot) Wound Laterality: Plantar, Right, Lateral Cleanser Soap and Water Discharge Instruction: May shower and wash wound with dial antibacterial soap and water prior to dressing change. Wound Cleanser Discharge Instruction: Cleanse the wound with wound cleanser prior to applying a clean dressing using gauze sponges, not tissue or cotton balls. Peri-Wound Care Zinc Oxide Ointment 30g tube Discharge Instruction: Apply Zinc Oxide to periwound with each dressing change cotton tipped applicators Topical Primary Dressing Epicord Secondary Dressing ADAPTIC TOUCH 3x4.25 in Discharge Instruction: Apply over primary dressing as directed. Optifoam Non-Adhesive Dressing, 4x4 in Discharge Instruction: Apply over primary dressing as directed. Woven Gauze Sponges 2x2 in Discharge Instruction: moistened with saline. Apply over primary dressing as directed. Secured With Paper Tape, 2x10 (in/yd) Discharge Instruction: Secure dressing with tape as directed. Compression Wrap Kerlix Roll 4.5x3.1 (in/yd) Discharge Instruction: Apply Kerlix and Coban compression as directed. Compression Stockings Add-Ons Electronic Signature(s) Signed: 04/21/2022 4:49:56 PM By: Adline Peals Entered By: Adline Peals on 04/21/2022 10:22:31 [name], Resean M ([id number].pdf Page 7 of 7 -------------------------------------------------------------------------------- Vitals Details Patient Name: Date of Service: [name], [name] 04/21/2022 10:00 A M Medical Record Number: [id number] Patient Account Number: [id number] Date of Birth/Sex: Treating RN: Dec 25, 1970  (52 y.o. [name] Primary Care Monserath Neff: [name] Other Clinician: Referring Pershing Skidmore: Treating TRUE Garciamartinez/Extender: [name] in Treatment: 41 Vital Signs Time Taken: 09:46 Temperature (F): 98.7 Height (in): 72 Pulse (bpm): 80 Weight (lbs): 312 Respiratory Rate (breaths/min): 16 Body Mass Index (BMI): 42.3 Blood Pressure (mmHg): 139/83 Capillary Blood  Glucose (mg/dl): 161 Reference Range: 80 - 120 mg / dl Electronic Signature(s) Signed: 04/21/2022 4:49:56 PM By: Adline Peals Entered By: Adline Peals on 04/21/2022 09:48:20

## 2022-04-23 ENCOUNTER — Other Ambulatory Visit: Payer: Self-pay

## 2022-04-28 ENCOUNTER — Encounter (HOSPITAL_BASED_OUTPATIENT_CLINIC_OR_DEPARTMENT_OTHER): Payer: BLUE CROSS/BLUE SHIELD | Admitting: General Surgery

## 2022-04-28 ENCOUNTER — Other Ambulatory Visit: Payer: Self-pay

## 2022-04-28 DIAGNOSIS — E11621 Type 2 diabetes mellitus with foot ulcer: Secondary | ICD-10-CM | POA: Diagnosis not present

## 2022-04-28 MED ORDER — "INSULIN SYRINGE-NEEDLE U-100 31G X 5/16"" 1 ML MISC": 3 refills | Status: DC

## 2022-04-28 NOTE — Progress Notes (Signed)
[name], [name] ([id number]) [id number] Page 1 of 10 Visit Report for 04/28/2022 Chief Complaint Document Details Patient Name: Date of Service: [name], [name] 04/28/2022 10:00 [name] Medical Record Number: [id number] Patient Account Number: [id number] Date of Birth/Sex: Treating RN: 01/28/1970 (52 y.o. [name] Primary Care Provider: Tana Conch Other Clinician: Referring Provider: Treating Provider/Extender: Arvin Collard in Treatment: 42 Information Obtained from: Patient Chief Complaint Patients presents for treatment of an open diabetic ulcer Electronic Signature(s) Signed: 04/28/2022 11:36:09 AM By: Duanne Guess MD FACS Entered By: Duanne Guess on 04/28/2022 11:36:09 -------------------------------------------------------------------------------- Cellular or Tissue Based Product Details Patient Name: Date of Service: [name], [name] 04/28/2022 10:00 [name] Medical Record Number: [id number] Patient Account Number: [id number] Date of Birth/Sex: Treating RN: 1970/07/08 (52 y.o. [name] Primary Care Provider: Tana Conch Other Clinician: Referring Provider: Treating Provider/Extender: Arvin Collard in Treatment: 42 Cellular or Tissue Based Product Type Wound #2 Right,Lateral,Plantar Foot Applied to: Performed By: Physician Duanne Guess, MD Cellular or Tissue Based Product Type: Epicord Level of Consciousness (Pre-procedure): Awake and Alert Pre-procedure Verification/Time Out Yes - 10:25 Taken: Location: genitalia / hands / feet / multiple digits Wound Size (sq cm): 0.04 Product Size (sq cm): 6 Waste Size (sq cm): 0 Amount of Product Applied (sq cm): 6 Instrument Used: Forceps, Scissors Lot #: [id number] Expiration Date: 10/21/2026 Fenestrated: No Reconstituted: Yes Solution Type: normal saline Solution Amount: 2 ml Lot #: [id number] Solution Expiration Date:  06/19/2024 Secured: Yes Secured With: Steri-Strips Dressing Applied: Yes Primary Dressing: adaptic Procedural Pain: 0 Post Procedural Pain: 0 Response to Treatment: Procedure was tolerated well Level of Consciousness (Post- Awake and Alert procedure): Post Procedure Diagnosis Same as Pre-procedure Notes scribed for Dr. Lady Gary by [name], RN Electronic Signature(s) [name], [name] ([id number]) [id number] Page 2 of 10 Signed: 04/28/2022 12:22:47 PM By: Duanne Guess MD FACS Signed: 04/28/2022 3:52:02 PM By: [name] By: [name] on 04/28/2022 10:26:50 -------------------------------------------------------------------------------- HPI Details Patient Name: Date of Service: [name], [name] 04/28/2022 10:00 [name] Medical Record Number: [id number] Patient Account Number: [id number] Date of Birth/Sex: Treating RN: Aug 26, 1970 (52 y.o. 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 The most recent amputation was in August 2022. At that operation, antibiotic beads were placed in the wound. He has been managed by podiatry for his procedures and management of his wounds. He has been in a [facility], water quality. He is on oral doxycycline. They have been using Betadine wet to dry dressings along with Iodosorb. The patient states that when he thinks the wound is getting too dry, he applies topical Neosporin. At his last visit, on June 7 of this year, the podiatrist determined that he felt the wound was stalled and referred him to wound care for additional evaluation and management.  An MRI has been ordered, but not yet scheduled or performed. Pathology from his operation in August 2022 demonstrated findings consistent with chronic osteomyelitis. Today, there is a large irregular wound on the plantar surface of his right foot, at about the level of the fifth metatarsal head. This tracks through to a pinpoint opening on the dorsal lateral portion of  his foot. The intake nurse reported purulent drainage. There is some malodor from the wound. No frank necrosis identified. 07/11/2021: Today, the wounds do not connect. I attempted multiple times from various directions and the shared tunnel is no longer open. He has some slough accumulation on the dorsal part of his foot as well as slough and callus buildup on the plantar surface. His MRI is scheduled for June 29. No purulent drainage or malodor appreciated today. 07/19/2021: The lateral foot wound has closed and there is no tunnel connecting the plantar foot wound to that site. The plantar foot wound still probes quite deeply, however. There is some slough, eschar, and nonviable tissue accumulated in the wound bed. No malodorous drainage present. His MRI was performed yesterday and is consistent with fairly extensive osteomyelitis. 07/29/2021: The patient has an appointment with infectious disease on July 18 to treat his osteomyelitis. The plantar wound still probes quite deeply, approaching bone. The orifice has narrowed quite substantially, however, making it more difficult for him to pack. 08/05/2021: The tunnel connecting the lateral foot wound to the plantar foot wound has reopened. He sees infectious disease tomorrow to discuss long-term antibiotic treatment for osteomyelitis. There was a bit of murky drainage in the wound, but this was noted after he had had topical lidocaine applied so may have just been a blob of the anesthetic. No odor or frank purulent drainage. The wound probes deeply at the midfoot approaching bone. He does  have MRI results confirming his diagnosis of osteomyelitis. He has failed to progress with conventional treatment and I think his best chance for preservation of the foot is to initiate hyperbaric oxygen therapy. 08/13/2021: The tunnel connecting the 2 wounds has closed again. He has a PICC line and is getting IV daptomycin and cefepime. EKG and chest x-ray are within normal limits. He is scheduled to start hyperbaric oxygen therapy tomorrow. The wound in his midfoot probes deeply, approaching bone. The skin at the orifice continues to heaped up and threatens to close over despite the large cavity within. No erythema, induration, or purulent drainage. The wound on his lateral foot is fairly small and quite clean. 08/19/2021: The lateral foot wound has nearly closed. The wound in his midfoot does not probe quite as deeply today. The skin at the orifice continues to try to roll in and obscure the opening. No frankly necrotic tissue appreciated. He is tolerating hyperbaric oxygen therapy. 08/26/2021: The lateral foot wound has closed completely. The wound in his midfoot is shallower again today. The wound orifice is contracting. We are using gentamicin and silver alginate. He continues on IV daptomycin and cefepime and is tolerating hyperbaric oxygen therapy without difficulty. 09/02/2021: His foot is a little bit sore today but he was up walking on it all weekend doing back to school shopping with his daughter. The tunneling is down to about 3 cm and I do not appreciate bone at the tip of the probe. The wound is clean but has some callus creating an overhanging lip at the distal aspect. He continues to receive hyperbaric oxygen therapy as well as IV daptomycin and cefepime. 09/09/2021: His wound continues to improve. The tunnel has come in by 0.9 cm. He continues to form callus around the orifice of the wound. He is tolerating hyperbaric oxygen therapy along with his IV antibiotics. 09/16/2021: The tunneling  is down to just half a centimeter. He continues to build up callus around the orifice of the wound. He completed his course of  IV antibiotics and his PICC line is scheduled to be removed this Friday. He is tolerating hyperbaric oxygen without difficulty. We have been applying topical gentamicin and silver alginate to his wound. 09/24/2021: The wound depth has come in even further. There is a little bit of callus buildup around the orifice. The opening is quite narrow, at this point. 09/30/2021: The wound depth is about the same this week. The opening continues to contract with callus accumulation. There is some discoloration of the skin on the lateral part of his foot and he admitted to walking more than usual this weekend and also wearing a different pair shoes. He continues to tolerate hyperbaric oxygen therapy without difficulty. 10/07/2021: The wound depth has contracted a bit and is now approximately 1 to 1.5 cm. The orifice of the wound continues to try and close in over the space. There is just some callus and skin around the margins obscuring the orifice. 10/14/2021: The wound depth has come in a little bit more. The orifice of the wound is rolling inwards with epithelium and callus, making it difficult to pack the wound. 10/21/2021: There is still 1 portion of the wound, at the lateral aspect, that is still a couple of centimeters deep. The architecture of the wound makes this somewhat challenging to access and pack. Everything else looks clean. He has his usual accumulation of callus and skin, narrowing the orifice. [place], Mancel ApplebyM ([id number]) [id number] Page 3 of 10 10/29/2021: No significant change to the depth of the wound. The orifice continues to contract with callus and skin narrowing the opening. His wife has been packing the wound and doing an excellent job. 11/05/2021: The depth of the wound has come in by about a centimeter. There is less callus accumulation  around the orificedespite this, the wound is narrower today. We are still awaiting insurance approval for Kerecis powder. 11/12/2021: The depth of the wound continues to contract. He has accumulated some callus around the orifice, as usual. We have received a verbal confirmation that he is approved for Kerecis, but no formal paperwork has yet been received. 11/19/2021: The depth continues to come in. There is callus accumulation around the orifice, as usual. He has been formally approved for Kerecis, but unfortunately we do not have a billing mechanism for the powder in [employer] yet. We are working to address this so we can begin treatment. 11/25/2021: Continued filling in of the depth of the wound. Continued buildup of callus around the orifice. No concern for infection. As we do not have a way to bill for Kerecis powder, but are capable of billing [facility] the [facility], we are going to use that on him instead. 12/03/2021: The depth of the wound continues to fill in. As usual, he has accumulated callus and skin around the orifice of the wound. No concern for infection. He will complete his hyperbaric oxygen therapy this week. 12/17/2021: The wound depth continues to contract. There is callus and skin accumulation around the orifice of the wound, as per usual. There is more epithelium on the actual wound surface. Unfortunately, his [employer] Round[employer]recis was not ordered. 12/24/2021: The wound depth has come in a little bit more this week. There is less skin accumulation around the orifice, but still some callus. On questioning, he admits to spending a fair amount of time on his feet. 12/12; this is a very difficult wound to assess size slitlike wound with some depth but minimal opening. I applied Kerecis No. 4 12/18; Kerecis No. 5  01/15/2022: His wound measured deeper today. There is also some evidence of pressure induced tissue injury around the lateral aspect of his foot. The patient reports that he was wearing  different shoes than usual and felt like his foot was rolling a lot over the weekend. There is heavy callus around the orifice of the wound. No concern for infection. 01/21/2022: The depth decreased today from 1.7 to 1.0 cm. Significantly less tissue damage as compared to last week. Still with callus and skin accumulation around the orifice. 01/29/2022: The wound continues to contract and the depth is coming in further. We are still working on getting [employer] for him. 02/04/2022: No real change this week. Still working on the [employer]. 02/11/2022: No significant changes again. We are still working on the [employer]. 02/18/2022: No change to the wound. We have submitted in IVR for Epicord. 02/25/2022: Unfortunately, his wound has deteriorated over the past week. It is deeper and is approaching bone once again. There is more slough accumulation and the tissue shows signs of pressure injury. He says that he has been trying to wear his new work boots, which are waterproof. He says they make his feet sweat excessively. I think our main challenge with him is that we just are not able to adequately offload him while he is still working and on his feet through much of the day. He is willing to consider total contact casting, but will need to make some arrangements with work. On a more positive note, however, he has been approved for Epicord. 03/03/2022: The wound probed a little bit deeper again today. We are planning to put him in a total contact cast as soon as he has cleared it with work to take some time off. 03/10/2022: The wound was a little bit shallower today and the overall appearance is significantly improved. We are planning to put him in a cast today. 03/12/2022: He is here for his first total contact cast change. He denies any issues with pressure, rubbing, or pain. 03/17/2022: For some reason, the wound probes deeper today. There is some slough and nonviable subcutaneous tissue around  the opening. He denies any difficulty with the total contact cast. 03/24/2022: The wound is shallower today. There is better granulation tissue visible on the wound surface. 03/31/2022: His cast protector got a hole in it and his cast got wet. His foot is macerated. The wound measured a little deeper today. There is some hypertrophic granulation tissue at the orifice of his wound. 3/18; patient presents for follow-up. He had no issues with the total contact cast. We have been using epi cord to the wound bed. Overall wound is stable. Patient denies signs of infection. 04/14/2022: The depth of the wound has rather remarkably decreased to 1.3 cm today. The orifice of the wound has also contracted quite a bit. 04/21/2022: The depth of the wound has decreased even further. The orifice is also smaller. The wound is very clean. 04/28/2022: The depth of the wound is down to 0.5 cm. The orifice continues to contract. Electronic Signature(s) Signed: 04/28/2022 11:36:37 AM By: Duanne Guess MD FACS Entered By: Duanne Guess on 04/28/2022 11:36:37 -------------------------------------------------------------------------------- Physical Exam Details Patient Name: Date of Service: [name], [name] 04/28/2022 10:00 [name] Medical Record Number: [id number] Patient Account Number: [id number] Date of Birth/Sex: Treating RN: 12-05-1970 (52 y.o. [name] Primary Care Provider: Tana Conch Other Clinician: Referring Provider: Treating Provider/Extender: Arvin Collard in Treatment: 42 [name], [name] (  [id number]) [id number] Page 4 of 10 Constitutional Hypertensive, asymptomatic. . . . no acute distress. Respiratory Normal work of breathing on room air. Notes 04/28/2022: The depth of the wound is down to 0.5 cm. The orifice continues to contract. Electronic Signature(s) Signed: 04/28/2022 11:37:12 AM By: Duanne Guess MD FACS Entered By: Duanne Guess on  04/28/2022 11:37:12 -------------------------------------------------------------------------------- Physician Orders Details Patient Name: Date of Service: [name], [name] 04/28/2022 10:00 [name] Medical Record Number: [id number] Patient Account Number: [id number] Date of Birth/Sex: Treating RN: 1970-03-02 (52 y.o. 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 - Dr Lady Gary RM 2 Return A Anesthetic Wound #2 Right,Lateral,Plantar Foot (In clinic) Topical Lidocaine 4% applied to wound bed Cellular or Tissue Based Products Cellular or Tissue Based Product Type: - Epicord #10 Cellular or Tissue Based Product applied to wound bed, secured with steri-strips, cover with Adaptic or Mepitel. (DO NOT REMOVE). Bathing/ Shower/ Hygiene May shower with protection but do not get wound dressing(s) wet. Protect dressing(s) with water repellant cover (for example, large plastic bag) or a cast cover and may then take shower. Off-Loading Total Contact Cast to Right Lower Extremity Other: - Try to not stand on your feet too much, minimize walking Wound Treatment Wound #2 - Foot Wound Laterality: Plantar, Right, Lateral Cleanser: Soap and Water 1 x Per Week/30 Days Discharge Instructions: May shower and wash wound with dial antibacterial soap and water prior to dressing change. Cleanser: Wound Cleanser (Generic) 1 x Per Week/30 Days Discharge Instructions: Cleanse the wound with wound cleanser prior to applying a clean dressing using  gauze sponges, not tissue or cotton balls. Peri-Wound Care: Zinc Oxide Ointment 30g tube 1 x Per Week/30 Days Discharge Instructions: Apply Zinc Oxide to periwound with each dressing change Peri-Wound Care: cotton tipped applicators (Generic) 1 x Per Week/30 Days Prim Dressing: Epicord ary 1 x Per Week/30 Days Secondary Dressing: ADAPTIC TOUCH 3x4.25 in 1 x Per Week/30 Days Discharge Instructions: Apply over primary dressing as directed. Secondary Dressing: Optifoam Non-Adhesive Dressing, 4x4 in 1 x Per Week/30 Days [name], [name] ([id number]) [phone].pdf Page 5 of 10 Discharge Instructions: Apply over primary dressing as directed. Secondary Dressing: Woven Gauze Sponges 2x2 in 1 x Per Week/30 Days Discharge Instructions: moistened with saline. Apply over primary dressing as directed. Secured With: Paper Tape, 2x10 (in/yd) (Generic) 1 x Per Week/30 Days Discharge Instructions: Secure dressing with tape as directed. Compression Wrap: Kerlix Roll 4.5x3.1 (in/yd) (Generic) 1 x Per Week/30 Days Discharge Instructions: Apply Kerlix and Coban compression as directed. Patient Medications llergies: No Known Allergies A Notifications Medication Indication Start End 04/28/2022 lidocaine DOSE topical 4 % cream - cream topical Electronic Signature(s) Signed: 04/28/2022 12:22:47 PM By: Duanne Guess MD FACS Entered By: Duanne Guess on 04/28/2022 11:37:26 -------------------------------------------------------------------------------- Problem List Details Patient Name: Date of Service: [name], [name] 04/28/2022 10:00 [name] Medical Record Number: [id number] Patient Account Number: [id number] Date of Birth/Sex: Treating RN: 13-Oct-1970 (52 y.o. 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 04/28/2022 10:00 [name] Medical Record Number: [id number] Patient Account Number: [id number] Date of Birth/Sex: Treating RN: Jan 04, 1971 (52 y.o. 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 The most recent amputation was in August 2022. At that operation, antibiotic beads were placed in the wound. He has been managed by podiatry for his procedures and management of his wounds. He has been in a Scientist, water quality. He is on oral doxycycline. They have been using Betadine wet to dry dressings along with Iodosorb. The patient states that when he thinks the wound is getting too dry, he applies topical Neosporin. At his last visit, on June 7 of this year, the podiatrist determined that he felt the wound was stalled and referred him to wound care for  additional evaluation and management. An MRI has been ordered, but not yet scheduled or performed. Pathology from his operation in August 2022 demonstrated findings consistent with chronic osteomyelitis. Today, there is a large irregular wound on the plantar surface of his right foot, at about the level of the fifth metatarsal head. This tracks through to a pinpoint opening on the dorsal lateral portion of his foot. The intake nurse reported purulent drainage. There is some malodor from the wound. No frank necrosis identified. 07/11/2021: Today, the wounds do not connect. I attempted multiple times from various directions and the shared tunnel is no longer open. He has some slough accumulation on the dorsal part of his foot as well as slough and callus buildup on the plantar surface. His MRI is scheduled for June 29. No purulent drainage or malodor appreciated today. 07/19/2021: The lateral foot wound has closed and there is no tunnel connecting the plantar foot wound to that site. The plantar foot wound still probes quite deeply, however. There is some slough, eschar, and nonviable tissue accumulated in the wound bed. No malodorous drainage present. His MRI was performed yesterday and is consistent with fairly extensive osteomyelitis. 07/29/2021: The patient has an appointment with infectious disease on July 18 to treat his osteomyelitis. The plantar wound still probes quite deeply, approaching bone. The orifice has narrowed quite substantially, however, making it more difficult for him to pack. 08/05/2021: The tunnel connecting the lateral foot wound to the plantar foot wound has reopened. He sees infectious disease tomorrow to discuss long-term antibiotic treatment for osteomyelitis. There was a bit of murky drainage in the wound, but this was noted after he had had topical lidocaine applied so may have just been a blob of the anesthetic. No odor or frank purulent drainage. The wound probes deeply at  the midfoot approaching bone.  He does have MRI results confirming his diagnosis of osteomyelitis. He has failed to progress with conventional treatment and I think his best chance for preservation of the foot is to initiate hyperbaric oxygen therapy. 08/13/2021: The tunnel connecting the 2 wounds has closed again. He has a PICC line and is getting IV daptomycin and cefepime. EKG and chest x-ray are within normal limits. He is scheduled to start hyperbaric oxygen therapy tomorrow. The wound in his midfoot probes deeply, approaching bone. The skin at the orifice continues to heaped up and threatens to close over despite the large cavity within. No erythema, induration, or purulent drainage. The wound on his lateral foot is fairly small and quite clean. 08/19/2021: The lateral foot wound has nearly closed. The wound in his midfoot does not probe quite as deeply today. The skin at the orifice continues to try to roll in and obscure the opening. No frankly necrotic tissue appreciated. He is tolerating hyperbaric oxygen therapy. 08/26/2021: The lateral foot wound has closed completely. The wound in his midfoot is shallower again today. The wound orifice is contracting. We are using gentamicin and silver alginate. He continues on IV daptomycin and cefepime and is tolerating hyperbaric oxygen therapy without difficulty. 09/02/2021: His foot is a little bit sore today but he was up walking on it all weekend doing back to school shopping with his daughter. The tunneling is down to about 3 cm and I do not appreciate bone at the tip of the probe. The wound is clean but has some callus creating an overhanging lip at the distal aspect. He continues to receive hyperbaric oxygen therapy as well as IV daptomycin and cefepime. 09/09/2021: His wound continues to improve. The tunnel has come in by 0.9 cm. He continues to form callus around the orifice of the wound. He is tolerating hyperbaric oxygen therapy along with his IV  antibiotics. 09/16/2021: The tunneling is down to just half a centimeter. He continues to build up callus around the orifice of the wound. He completed his course of IV antibiotics and his PICC line is scheduled to be removed this Friday. He is tolerating hyperbaric oxygen without difficulty. We have been applying topical gentamicin and silver alginate to his wound. 09/24/2021: The wound depth has come in even further. There is a little bit of callus buildup around the orifice. The opening is quite narrow, at this point. 09/30/2021: The wound depth is about the same this week. The opening continues to contract with callus accumulation. There is some discoloration of the skin on the lateral part of his foot and he admitted to walking more than usual this weekend and also wearing a different pair shoes. He continues to tolerate hyperbaric oxygen therapy without difficulty. 10/07/2021: The wound depth has contracted a bit and is now approximately 1 to 1.5 cm. The orifice of the wound continues to try and close in over the space. There is just some callus and skin around the margins obscuring the orifice. 10/14/2021: The wound depth has come in a little bit more. The orifice of the wound is rolling inwards with epithelium and callus, making it difficult to pack the wound. 10/21/2021: There is still 1 portion of the wound, at the lateral aspect, that is still a couple of centimeters deep. The architecture of the wound makes this somewhat challenging to access and pack. Everything else looks clean. He has his usual accumulation of callus and skin, narrowing the orifice. 10/29/2021: No significant change to the depth of the  wound. The orifice continues to contract with callus and skin narrowing the opening. His wife has been packing the wound and doing an excellent job. 11/05/2021: The depth of the wound has come in by about a centimeter. There is less callus accumulation around the orificeoodespite this, the  wound is narrower today. We are still awaiting insurance approval for Kerecis powder. 11/12/2021: The depth of the wound continues to contract. He has accumulated some callus around the orifice, as usual. We have received a verbal confirmation that he is approved for Kerecis, but no formal paperwork has yet been received. 11/19/2021: The depth continues to come in. There is callus accumulation around the orifice, as usual. He has been formally approved for Kerecis, but unfortunately we do not have a billing mechanism for the powder in [facility] yet. We are working to address this so we can begin treatment. [name], [name] ([id number]) [id number] Page 7 of 10 11/25/2021: Continued filling in of the depth of the wound. Continued buildup of callus around the orifice. No concern for infection. As we do not have a way to bill for Kerecis powder, but are capable of billing for the [facility] sheet, we are going to use that on him instead. 12/03/2021: The depth of the wound continues to fill in. As usual, he has accumulated callus and skin around the orifice of the wound. No concern for infection. He will complete his hyperbaric oxygen therapy this week. 12/17/2021: The wound depth continues to contract. There is callus and skin accumulation around the orifice of the wound, as per usual. There is more epithelium on the actual wound surface. Unfortunately, his Darlen Round was not ordered. 12/24/2021: The wound depth has come in a little bit more this week. There is less skin accumulation around the orifice, but still some callus. On questioning, he admits to spending a fair amount of time on his feet. 12/12; this is a very difficult wound to assess size slitlike wound with some depth but minimal opening. I applied Kerecis No. 4 12/18; Kerecis No. 5 01/15/2022: His wound measured deeper today. There is also some evidence of pressure induced tissue injury around the lateral aspect of his foot.  The patient reports that he was wearing different shoes than usual and felt like his foot was rolling a lot over the weekend. There is heavy callus around the orifice of the wound. No concern for infection. 01/21/2022: The depth decreased today from 1.7 to 1.0 cm. Significantly less tissue damage as compared to last week. Still with callus and skin accumulation around the orifice. 01/29/2022: The wound continues to contract and the depth is coming in further. We are still working on getting [employer] for him. 02/04/2022: No real change this week. Still working on the [employer]. 02/11/2022: No significant changes again. We are still working on the [employer]. 02/18/2022: No change to the wound. We have submitted in IVR for Epicord. 02/25/2022: Unfortunately, his wound has deteriorated over the past week. It is deeper and is approaching bone once again. There is more slough accumulation and the tissue shows signs of pressure injury. He says that he has been trying to wear his new work boots, which are waterproof. He says they make his feet sweat excessively. I think our main challenge with him is that we just are not able to adequately offload him while he is still working and on his feet through much of the day. He is willing to consider total contact casting, but will need to  make some arrangements with work. On a more positive note, however, he has been approved for Epicord. 03/03/2022: The wound probed a little bit deeper again today. We are planning to put him in a total contact cast as soon as he has cleared it with work to take some time off. 03/10/2022: The wound was a little bit shallower today and the overall appearance is significantly improved. We are planning to put him in a cast today. 03/12/2022: He is here for his first total contact cast change. He denies any issues with pressure, rubbing, or pain. 03/17/2022: For some reason, the wound probes deeper today. There is some slough  and nonviable subcutaneous tissue around the opening. He denies any difficulty with the total contact cast. 03/24/2022: The wound is shallower today. There is better granulation tissue visible on the wound surface. 03/31/2022: His cast protector got a hole in it and his cast got wet. His foot is macerated. The wound measured a little deeper today. There is some hypertrophic granulation tissue at the orifice of his wound. 3/18; patient presents for follow-up. He had no issues with the total contact cast. We have been using epi cord to the wound bed. Overall wound is stable. Patient denies signs of infection. 04/14/2022: The depth of the wound has rather remarkably decreased to 1.3 cm today. The orifice of the wound has also contracted quite a bit. 04/21/2022: The depth of the wound has decreased even further. The orifice is also smaller. The wound is very clean. 04/28/2022: The depth of the wound is down to 0.5 cm. The orifice continues to contract. Patient History Information obtained from Patient. Social History Never smoker, Marital Status - Married, Alcohol Use - Never, Drug Use - No History, Caffeine Use - Daily - T coffee. ea; Medical History Endocrine Patient has history of Type II Diabetes Hospitalization/Surgery History - Amuptation of 3rd,4th and 5th toes of Right foot;Oral Surgery;Anal Fissure surgery; Cholecystectomy. Objective Constitutional Hypertensive, asymptomatic. no acute distress. Vitals Time Taken: 10:05 AM, Height: 72 in, Weight: 312 lbs, BMI: 42.3, Temperature: 98.5 F, Pulse: 80 bpm, Respiratory Rate: 16 breaths/min, Blood [name], [name] ([id number]) [id number] Page 8 of 10 Pressure: 157/90 mmHg, Capillary Blood Glucose: 138 mg/dl. Respiratory Normal work of breathing on room air. General Notes: 04/28/2022: The depth of the wound is down to 0.5 cm. The orifice continues to contract. Integumentary (Hair, Skin) Wound #2 status is Open. Original  cause of wound was Gradually Appeared. The date acquired was: 12/19/2016. The wound has been in treatment 42 weeks. The wound is located on the Right,Lateral,Plantar Foot. The wound measures 0.2cm length x 0.2cm width x 0.5cm depth; 0.031cm^2 area and 0.016cm^3 volume. There is Fat Layer (Subcutaneous Tissue) exposed. There is no tunneling or undermining noted. There is a medium amount of serosanguineous drainage noted. The wound margin is thickened. There is large (67-100%) granulation within the wound bed. There is no necrotic tissue within the wound bed. The periwound skin appearance had no abnormalities noted for color. The periwound skin appearance exhibited: Callus, Maceration. Periwound temperature was noted as No Abnormality. 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 A skin graft procedure using a bioengineered skin substitute/cellular or tissue based product was performed by Lady Gary,  Victorino Dike, MD with the following instrument(s): Forceps and Scissors. Epicord was applied and secured with Steri-Strips. 6 sq cm of product was utilized and 0 sq cm was wasted. Post Application, adaptic was applied. A Time Out was conducted at 10:25, prior to the start of the procedure. The procedure was tolerated well with a pain level of 0 throughout and a pain level of 0 following the procedure. Post procedure Diagnosis Wound #2: Same as Pre-Procedure General Notes: scribed for Dr. Lady Gary by [name], RN. Pre-procedure diagnosis of Wound #2 is a Diabetic Wound/Ulcer of the Lower Extremity located on the Right,Lateral,Plantar Foot . There was a T Contact otal Cast  Procedure by Duanne Guess, MD. Post procedure Diagnosis Wound #2: Same as Pre-Procedure Notes: scribed for Dr. Lady Gary by [name], RN. Plan Follow-up Appointments: Return Appointment in 1 week. - Dr Lady Gary RM 2 Anesthetic: Wound #2 Right,Lateral,Plantar Foot: (In clinic) Topical Lidocaine 4% applied to wound bed Cellular or Tissue Based Products: Cellular or Tissue Based Product Type: - Epicord #10 Cellular or Tissue Based Product applied to wound bed, secured with steri-strips, cover with Adaptic or Mepitel. (DO NOT REMOVE). Bathing/ Shower/ Hygiene: May shower with protection but do not get wound dressing(s) wet. Protect dressing(s) with water repellant cover (for example, large plastic bag) or a cast cover and may then take shower. Off-Loading: T Contact Cast to Right Lower Extremity otal Other: - Try to not stand on your feet too much, minimize walking The following medication(s) was prescribed: lidocaine topical 4 % cream cream topical was prescribed at facility WOUND #2: - Foot Wound Laterality: Plantar, Right, Lateral Cleanser: Soap and Water 1 x Per Week/30 Days Discharge Instructions: May shower and wash wound with dial antibacterial soap and water prior to dressing change. Cleanser: Wound Cleanser (Generic) 1 x Per Week/30 Days Discharge Instructions: Cleanse the wound with wound cleanser prior to applying a clean dressing using gauze sponges, not tissue or cotton balls. Peri-Wound Care: Zinc Oxide Ointment 30g tube 1 x Per Week/30 Days Discharge Instructions: Apply Zinc Oxide to periwound with each dressing change Peri-Wound Care: cotton tipped applicators (Generic) 1 x Per Week/30 Days Prim Dressing: Epicord 1 x Per Week/30 Days ary Secondary Dressing: ADAPTIC TOUCH 3x4.25 in 1 x Per Week/30 Days Discharge Instructions: Apply over primary dressing as directed. Secondary Dressing: Optifoam Non-Adhesive Dressing, 4x4 in 1 x Per Week/30 Days Discharge  Instructions: Apply over primary dressing as directed. Secondary Dressing: Woven Gauze Sponges 2x2 in 1 x Per Week/30 Days Discharge Instructions: moistened with saline. Apply over primary dressing as directed. Secured With: Paper T ape, 2x10 (in/yd) (Generic) 1 x Per Week/30 Days [name], [name] ([id number]) [id number] Page 9 of 10 Discharge Instructions: Secure dressing with tape as directed. Compression Wrap: Kerlix Roll 4.5x3.1 (in/yd) (Generic) 1 x Per Week/30 Days Discharge Instructions: Apply Kerlix and Coban compression as directed. 04/28/2022: The depth of the wound is down to 0.5 cm. The orifice continues to contract. The wound bed was prepared for application of Epicord skin substitute. The product was hydrated with saline and packed into the wound cavity. It was secured with Adaptic and Steri-Strips. A gauze sponge was used as a bolster. A total contact cast was then applied in standard fashion. We are going to do see if we can get approval for more applications of Epicord, as he is doing very well with this treatment. He will follow-up in 1 week. Electronic Signature(s) Signed: 04/28/2022 11:38:41 AM By: Duanne Guess MD FACS Entered By:  Duanne Guess on 04/28/2022 11:38:41 -------------------------------------------------------------------------------- HxROS Details Patient Name: Date of Service: [name], [name] 04/28/2022 10:00 [name] Medical Record Number: [id number] Patient Account Number: [id number] Date of Birth/Sex: Treating RN: 11/14/1970 (52 y.o. [name] Primary Care Provider: Tana Conch Other Clinician: Referring Provider: Treating Provider/Extender: Arvin Collard in Treatment: 42 Information Obtained From Patient Endocrine Medical History: Positive for: Type II Diabetes Immunizations Pneumococcal Vaccine: Received Pneumococcal Vaccination: Yes Received Pneumococcal Vaccination On or After 60th Birthday:  No Implantable Devices None Hospitalization / Surgery History Type of Hospitalization/Surgery Amuptation of 3rd,4th and 5th toes of Right foot;Oral Surgery;Anal Fissure surgery; Cholecystectomy Family and Social History Never smoker; Marital Status - Married; Alcohol Use: Never; Drug Use: No History; Caffeine Use: Daily - T coffee; Financial Concerns: No; Food, ea; Clothing or Shelter Needs: No; Support System Lacking: No; Transportation Concerns: No Electronic Signature(s) Signed: 04/28/2022 12:22:47 PM By: Duanne Guess MD FACS Entered By: Duanne Guess on 04/28/2022 11:36:44 -------------------------------------------------------------------------------- Total Contact Cast Details Patient Name: Date of Service: [name], [name] 04/28/2022 10:00 [name] Medical Record Number: [id number] Patient Account Number: [id number] Date of Birth/Sex: Treating RN: [birth year] (52 y.o. [name] Primary Care Provider: Tana Conch Other Clinician: Referring Provider: Treating Provider/Extender: Arvin Collard in Treatment: 84 T Contact Cast Applied for Wound Assessment: otal Wound #2 Right,Lateral,Plantar Foot Performed By: Physician Duanne Guess, MD [name], [name] ([id number]) [id number] Page 10 of 10 Post Procedure Diagnosis Same as Pre-procedure Notes scribed for Dr. Lady Gary by [name], RN Electronic Signature(s) Signed: 04/28/2022 12:22:47 PM By: Duanne Guess MD FACS Signed: 04/28/2022 3:52:02 PM By: [name] Entered By: [name] on 04/28/2022 10:23:51 -------------------------------------------------------------------------------- SuperBill Details Patient Name: Date of Service: [name], [name] 04/28/2022 Medical Record Number: [id number] Patient Account Number: [id number] Date of Birth/Sex: Treating RN: 1970/07/01 (52 y.o. [name] Primary Care Provider: Tana Conch Other  Clinician: Referring Provider: Treating Provider/Extender: Arvin Collard in Treatment: 42 Diagnosis Coding ICD-10 Codes Code Description 7124223882 Non-pressure chronic ulcer of other part of right foot with necrosis of bone E11.49 Type 2 diabetes mellitus with other diabetic neurological complication M86.671 Other chronic osteomyelitis, right ankle and foot Facility Procedures : CPT4 Code: 95638756 Description: Q4187 Epicord 2cm x 3cm - per sqcm Modifier: Quantity: 6 : CPT4 Code: 43329518 Description: 15275 - SKIN SUB GRAFT FACE/NK/HF/G ICD-10 Diagnosis Description L97.514 Non-pressure chronic ulcer of other part of right foot with necrosis of bone Modifier: Quantity: 1 : CPT4 Code: 84166063 Description: 01601 - APPLY TOTAL CONTACT LEG CAST ICD-10 Diagnosis Description L97.514 Non-pressure chronic ulcer of other part of right foot with necrosis of bone Modifier: Quantity: 1 Physician Procedures : CPT4 Code Description Modifier [id number] 99214 - WC PHYS LEVEL 4 - EST PT ICD-10 Diagnosis Description L97.514 Non-pressure chronic ulcer of other part of right foot with necrosis of bone E11.49 Type 2 diabetes mellitus with other diabetic neurological  complication M86.671 Other chronic osteomyelitis, right ankle and foot Quantity: 1 : [id number] 15275 - WC PHYS SKIN SUB GRAFT FACE/NK/HF/G ICD-10 Diagnosis Description L97.514 Non-pressure chronic ulcer of other part of right foot with necrosis of bone Quantity: 1 : [id number] 29445 - WC PHYS APPLY TOTAL CONTACT CAST ICD-10 Diagnosis Description L97.514 Non-pressure chronic ulcer of other part of right foot with necrosis of bone Quantity: 1 Electronic Signature(s) Signed: 04/28/2022 11:39:07 AM By: Duanne Guess MD FACS Entered By: Duanne Guess on 04/28/2022 11:39:06

## 2022-04-28 NOTE — Progress Notes (Signed)
[name], [name] ([id number]) [id number] Page 1 of 7 Visit Report for 04/28/2022 Arrival Information Details Patient Name: Date of Service: [place], [name] 04/28/2022 10:00 [name] Medical Record Number: [id number] Patient Account Number: [id number] Date of Birth/Sex: Treating RN: Jul 21, 1970 [name] Primary Care Panayiota Larkin: Tana Conch Other Clinician: Referring Robie Oats: Treating Levert Heslop/Extender: Arvin Collard in Treatment: 42 Visit Information History Since Last Visit Added or deleted any medications: No Patient Arrived: Ambulatory Any new allergies or adverse reactions: No Arrival Time: 10:01 Had a fall or experienced change in No Accompanied By: self activities of daily living that may affect Transfer Assistance: None risk of falls: Patient Identification Verified: Yes Signs or symptoms of abuse/neglect since last visito No Secondary Verification Process Completed: Yes Hospitalized since last visit: No Patient Requires Transmission-Based Precautions: No Implantable device outside of the clinic excluding No Patient Has Alerts: Yes cellular tissue based products placed in the center since last visit: Has Dressing in Place as Prescribed: Yes Has Footwear/Offloading in Place as Prescribed: Yes Right: T Contact Cast otal Pain Present Now: No Electronic Signature(s) Signed: 04/28/2022 3:52:02 PM By: [name] Entered By: [name] on 04/28/2022 10:02:47 -------------------------------------------------------------------------------- Encounter Discharge Information Details Patient Name: Date of Service: [name] 04/28/2022 10:00 [name] Medical Record Number: [id number] Patient Account Number: [id number] Date of Birth/Sex: Treating RN: August 19, 1970 (52 y.o. [name] Primary Care Astor Gentle: Tana Conch Other Clinician: Referring Terita Hejl: Treating Yun Gutierrez/Extender:  Arvin Collard in Treatment: 42 Encounter Discharge Information Items Post Procedure Vitals Discharge Condition: Stable Temperature (F): 98.5 Ambulatory Status: Ambulatory Pulse (bpm): 80 Discharge Destination: Home Respiratory Rate (breaths/min): 16 Transportation: Private Auto Blood Pressure (mmHg): 157/90 Accompanied By: self Schedule Follow-up Appointment: Yes Clinical Summary of Care: Patient Declined Electronic Signature(s) Signed: 04/28/2022 3:52:02 PM By: [name] Entered By: [name] on 04/28/2022 10:38:45 -------------------------------------------------------------------------------- Lower Extremity Assessment Details Patient Name: Date of Service: [name] 04/28/2022 10:00 [name] Medical Record Number: [id number] Patient Account Number: [id number] Date of Birth/Sex: Treating RN: [birth year] (52 y.o. [name] Primary Care Aldene Hendon: Tana Conch Other Clinician: Referring Rozella Servello: Treating Tawna Alwin/Extender: Arvin Collard in Treatment: 42 Edema Assessment Left: [Left: Right] Franne Forts: :] Assessed: [Left: No] [Right: No] Edema: [Left: [name]] [Right: o] Calf Left: Right: Point of Measurement: From Medial Instep 41 cm Ankle Left: Right: Point of Measurement: From Medial Instep 21.4 cm Vascular Assessment Pulses: Dorsalis Pedis Palpable: [Right:Yes] Electronic Signature(s) Signed: 04/28/2022 3:52:02 PM By: [name] Entered By: [name] on 04/28/2022 10:17:48 -------------------------------------------------------------------------------- Multi Wound Chart Details Patient Name: Date of Service: [name] 04/28/2022 10:00 [name] Medical Record Number: [id number] Patient Account Number: [id number] Date of Birth/Sex: Treating RN: 05-21-1970 (52 y.o. [name] Primary Care Hilman Kissling: Tana Conch Other Clinician: Referring Rojean Ige: Treating Aasir Daigler/Extender:  Arvin Collard in Treatment: 42 Vital Signs Height(in): 72 Capillary Blood Glucose(mg/dl): 584 Weight(lbs): 835 Pulse(bpm): 80 Body Mass Index(BMI): 42.3 Blood Pressure(mmHg): 157/90 Temperature(F): 98.5 Respiratory Rate(breaths/min): 16 [2:Photos:] [[name]:[name]] Right, Lateral, Plantar Foot [name] Wound Location: Gradually Appeared [name] Wounding Event: Diabetic Wound/Ulcer of the Lower [name] Primary Etiology: Extremity Type II Diabetes [name] Comorbid History: 12/19/2016 [name] Date Acquired: 49 [name] Weeks of Treatment: Open [name] Wound Status: No [name] Wound Recurrence: 0.2x0.2x0.5 [name] Measurements L x W x D (cm) 0.031 [name] A (cm) : rea 0.016 [name]  Volume (cm) : 99.10% [name] % Reduction in A rea: 99.90% [name] % Reduction in Volume: Grade 3 [name] Classification: Medium [name] Exudate A mount: Serosanguineous [name] Exudate Type: red, brown [name] Exudate Color: Thickened [name] Wound Margin: Large (67-100%) [name] Granulation A mount: None Present (0%) [name] Necrotic A mount: Fat Layer (Subcutaneous Tissue): Yes [name] Exposed Structures: Fascia: No [name], [name] ([id number]) [id number] Page 3 of 7 Tendon: No Muscle: No Joint: No Bone: No Large (67-100%) [name] Epithelialization: Callus: Yes [name] Periwound Skin Texture: Maceration: Yes [name] Periwound Skin Moisture: No Abnormalities Noted [name] Periwound Skin Color: No Abnormality [name] Temperature: Cellular or Tissue Based Product [name] Procedures Performed: T Contact Cast otal Treatment Notes Wound #2 (Foot) Wound Laterality: Plantar, Right, Lateral Cleanser Soap and Water Discharge Instruction: May shower and wash wound with dial antibacterial soap and water prior to dressing change. Wound Cleanser Discharge Instruction: Cleanse the wound with wound cleanser prior to applying a  clean dressing using gauze sponges, not tissue or cotton balls. Peri-Wound Care Zinc Oxide Ointment 30g tube Discharge Instruction: Apply Zinc Oxide to periwound with each dressing change cotton tipped applicators Topical Primary Dressing Epicord Secondary Dressing ADAPTIC TOUCH 3x4.25 in Discharge Instruction: Apply over primary dressing as directed. Optifoam Non-Adhesive Dressing, 4x4 in Discharge Instruction: Apply over primary dressing as directed. Woven Gauze Sponges 2x2 in Discharge Instruction: moistened with saline. Apply over primary dressing as directed. Secured With Paper Tape, 2x10 (in/yd) Discharge Instruction: Secure dressing with tape as directed. Compression Wrap Kerlix Roll 4.5x3.1 (in/yd) Discharge Instruction: Apply Kerlix and Coban compression as directed. Compression Stockings Add-Ons Electronic Signature(s) Signed: 04/28/2022 11:36:01 AM By: Duanne Guessannon, Jennifer MD FACS Entered By: Duanne Guessannon, Jennifer on 04/28/2022 11:36:01 -------------------------------------------------------------------------------- Multi-Disciplinary Care Plan Details Patient Name: Date of [name] 04/28/2022 10:00 [name] Medical Record Number: [id number] Patient Account Number: [id number] Date of Birth/Sex: Treating RN: January 02, 1971 (52 y.o. [name] PalauM) Herrington, Taylor Primary Care Jacqualin Shirkey: Tana ConchHunter, Stephen Other Clinician: Referring Sudiksha Victor: Treating Isaic Syler/Extender: Arvin Collardannon, Jennifer Hunter, Stephen Weeks in Treatment: [address]42 Multidisciplinary Care Plan reviewed with physician Active Inactive Nutrition [name], [name] ([id number]) [id number] Page 4 of 7 Nursing Diagnoses: Impaired glucose control: actual or potential Potential for alteratiion in Nutrition/Potential for imbalanced nutrition Goals: Patient/caregiver will maintain therapeutic glucose control Date Initiated: 07/29/2021 Target Resolution Date: 05/23/2022 Goal Status:  Active Interventions: Assess patient nutrition upon admission and as needed per policy Provide education on elevated blood sugars and impact on wound healing Treatment Activities: Patient referred to Primary Care Physician for further nutritional evaluation : 07/29/2021 Notes: Wound/Skin Impairment Nursing Diagnoses: Impaired tissue integrity Goals: Patient/caregiver will verbalize understanding of skin care regimen Date Initiated: 07/04/2021 Target Resolution Date: 05/23/2022 Goal Status: Active Interventions: Assess ulceration(s) every visit Treatment Activities: Skin care regimen initiated : 07/04/2021 Notes: Electronic Signature(s) Signed: 04/28/2022 3:52:02 PM By: Samuella BruinHerrington, Taylor Entered By: Samuella BruinHerrington, Taylor on 04/28/2022 10:03:06 -------------------------------------------------------------------------------- Pain Assessment Details Patient Name: Date of [name] 04/28/2022 10:00 [name] Medical Record Number: [id number] Patient Account Number: [id number] Date of Birth/Sex: Treating RN: January 02, 1971 (52 y.o. [name] PalauM) Herrington, Taylor Primary Care Myrka Sylva: Tana ConchHunter, Stephen Other Clinician: Referring Lia Vigilante: Treating Barbar Brede/Extender: Arvin Collardannon, Jennifer Hunter, Stephen Weeks in Treatment: 42 Active Problems Location of Pain Severity and Description of Pain Patient Has Paino No Site Locations Rate the pain. Current Pain Level: 0 Pain Management and Medication [name], [name] ([id number]) [id number] Page 5 of  7 Current Pain Management: Electronic Signature(s) Signed: 04/28/2022 3:52:02 PM By: [name] Entered By: [name] on 04/28/2022 10:02:57 -------------------------------------------------------------------------------- Patient/Caregiver Education Details Patient Name: Date of Service[name] 4/8/2024andnbsp10:00 [name] Medical Record Number: [id number] Patient Account Number: [id number] Date of  Birth/Gender: Treating RN: 1971-01-19 (52 y.o. [name] Primary Care Physician: Tana Conch Other Clinician: Referring Physician: Treating Physician/Extender: Arvin Collard in Treatment: 48 Education Assessment Education Provided To: Patient Education Topics Provided Wound/Skin Impairment: Methods: Explain/Verbal Responses: Reinforcements needed, State content correctly Electronic Signature(s) Signed: 04/28/2022 3:52:02 PM By: [name] Entered By: [name] on 04/28/2022 10:03:20 -------------------------------------------------------------------------------- Wound Assessment Details Patient Name: Date of Service: [name] 04/28/2022 10:00 [name] Medical Record Number: [id number] Patient Account Number: [id number] Date of Birth/Sex: Treating RN: 04/04/1970 (52 y.o. [name] Primary Care Kathalene Sporer: Tana Conch Other Clinician: Referring Salih Williamson: Treating Kaliel Bolds/Extender: Arvin Collard in Treatment: 42 Wound Status Wound Number: 2 Primary Etiology: Diabetic Wound/Ulcer of the Lower Extremity Wound Location: Right, Lateral, Plantar Foot Wound Status: Open Wounding Event: Gradually Appeared Comorbid History: Type II Diabetes Date Acquired: 12/19/2016 Weeks Of Treatment: 42 Clustered Wound: No Photos Wound Measurements Length: (cm) 0.2 Width: (cm) 0.2 [name], [name] ([id number]) Depth: (cm) 0.5 Area: (cm) 0.031 Volume: (cm) 0.016 % Reduction in Area: 99.1% % Reduction in Volume: 99.9% [id number] Page 6 of 7 Epithelialization: Large (67-100%) Tunneling: No Undermining: No Wound Description Classification: Grade 3 Wound Margin: Thickened Exudate Amount: Medium Exudate Type: Serosanguineous Exudate Color: red, brown Foul Odor After Cleansing: No Slough/Fibrino No Wound Bed Granulation Amount: Large (67-100%) Exposed  Structure Necrotic Amount: None Present (0%) Fascia Exposed: No Fat Layer (Subcutaneous Tissue) Exposed: Yes Tendon Exposed: No Muscle Exposed: No Joint Exposed: No Bone Exposed: No Periwound Skin Texture Texture Color No Abnormalities Noted: No No Abnormalities Noted: Yes Callus: Yes Temperature / Pain Temperature: No Abnormality Moisture No Abnormalities Noted: No Maceration: Yes Treatment Notes Wound #2 (Foot) Wound Laterality: Plantar, Right, Lateral Cleanser Soap and Water Discharge Instruction: May shower and wash wound with dial antibacterial soap and water prior to dressing change. Wound Cleanser Discharge Instruction: Cleanse the wound with wound cleanser prior to applying a clean dressing using gauze sponges, not tissue or cotton balls. Peri-Wound Care Zinc Oxide Ointment 30g tube Discharge Instruction: Apply Zinc Oxide to periwound with each dressing change cotton tipped applicators Topical Primary Dressing Epicord Secondary Dressing ADAPTIC TOUCH 3x4.25 in Discharge Instruction: Apply over primary dressing as directed. Optifoam Non-Adhesive Dressing, 4x4 in Discharge Instruction: Apply over primary dressing as directed. Woven Gauze Sponges 2x2 in Discharge Instruction: moistened with saline. Apply over primary dressing as directed. Secured With Paper Tape, 2x10 (in/yd) Discharge Instruction: Secure dressing with tape as directed. Compression Wrap Kerlix Roll 4.5x3.1 (in/yd) Discharge Instruction: Apply Kerlix and Coban compression as directed. Compression Stockings Add-Ons Electronic Signature(s) Signed: 04/28/2022 3:52:02 PM By: [name] Entered By: [name] on 04/28/2022 10:19:44 [name], [name] ([id number]) [id number] Page 7 of 7 -------------------------------------------------------------------------------- Vitals Details Patient Name: Date of Service: [name] 04/28/2022 10:00 [name] Medical  Record Number: [id number] Patient Account Number: [id number] Date of Birth/Sex: Treating RN: 1970-02-18 (52 y.o. [name] Primary Care Kiko Ripp: Tana Conch Other Clinician: Referring Saxton Chain: Treating Kiauna Zywicki/Extender: Arvin Collard in Treatment: 42 Vital Signs Time Taken: 10:05 Temperature (F): 98.5 Height (in): 72 Pulse (bpm): 80 Weight (lbs): 312 Respiratory Rate (breaths/min): 16 Body Mass Index (  BMI): 42.3 Blood Pressure (mmHg): 157/90 Capillary Blood Glucose (mg/dl): 161 Reference Range: 80 - 120 mg / dl Electronic Signature(s) Signed: 04/28/2022 3:52:02 PM By: [name] Entered By: [name] on 04/28/2022 10:06:41

## 2022-05-01 ENCOUNTER — Telehealth: Payer: Self-pay | Admitting: Family Medicine

## 2022-05-01 MED ORDER — INSULIN PEN NEEDLE 31G X 8 MM MISC: 11 refills | Status: DC

## 2022-05-01 NOTE — Telephone Encounter (Signed)
Patient states no long take vial insulin so will need Insulin Syringe-Needle U-100 (BD INSULIN SYRINGE U/F) 31G X 5/16" 1 ML MISC  changed to pen needles. Pen needles can go to [facility] on [address]

## 2022-05-01 NOTE — Telephone Encounter (Signed)
Rx sent to pharmacy   

## 2022-05-06 ENCOUNTER — Encounter (HOSPITAL_BASED_OUTPATIENT_CLINIC_OR_DEPARTMENT_OTHER): Payer: BLUE CROSS/BLUE SHIELD | Admitting: General Surgery

## 2022-05-06 DIAGNOSIS — E11621 Type 2 diabetes mellitus with foot ulcer: Secondary | ICD-10-CM | POA: Diagnosis not present

## 2022-05-06 NOTE — Progress Notes (Signed)
[name], [name] ([id number]) [id number] Page 1 of 12 Visit Report for 05/06/2022 Chief Complaint Document Details Patient Name: Date of Service: [name], [name] 05/06/2022 10:00 A M Medical Record Number: [id number] Patient Account Number: [id number] Date of Birth/Sex: Treating RN: [birth year] (52 y.o. M) Primary Care Provider: Tana Conch Other Clinician: Referring Provider: Treating Provider/Extender: Arvin Collard in Treatment: 43 Information Obtained from: Patient Chief Complaint Patients presents for treatment of an open diabetic ulcer Electronic Signature(s) Signed: 05/06/2022 10:52:50 AM By: Duanne Guess MD FACS Entered By: Duanne Guess on 05/06/2022 10:52:50 -------------------------------------------------------------------------------- Cellular or Tissue Based Product Details Patient Name: Date of Service: [name], [name] 05/06/2022 10:00 A M Medical Record Number: [id number] Patient Account Number: [id number] Date of Birth/Sex: Treating RN: [birth year] (52 y.o. [name] Primary Care Provider: Tana Conch Other Clinician: Referring Provider: Treating Provider/Extender: Arvin Collard in Treatment: 43 Cellular or Tissue Based Product Type Wound #2 Right,Lateral,Plantar Foot Applied to: Performed By: Physician Duanne Guess, MD Cellular or Tissue Based Product Type: Epicord Level of Consciousness (Pre-procedure): Awake and Alert Pre-procedure Verification/Time Out Yes - 10:18 Taken: Location: genitalia / hands / feet / multiple digits Wound Size (sq cm): 0.04 Product Size (sq cm): 6.5 Waste Size (sq cm): 0 Amount of Product Applied (sq cm): 6.5 Instrument Used: Forceps, Scissors Lot #: [id number] Expiration Date: 11/21/2026 Fenestrated: No Reconstituted: Yes Solution Type: normal saline Solution Amount: 3ml Lot #: [id number] Solution Expiration Date:  06/19/2024 Secured: Yes Secured With: Steri-Strips Dressing Applied: Yes Primary Dressing: adaptic Procedural Pain: 0 Post Procedural Pain: 0 Response to Treatment: Procedure was tolerated well Level of Consciousness (Post- Awake and Alert procedure): Post Procedure Diagnosis Same as Pre-procedure Notes scribed for Dr. Lady Gary by [name], RN Electronic Signature(s) [name], [place] ([id number]) [id number] Page 2 of 12 Signed: 05/06/2022 10:57:43 AM By: Duanne Guess MD FACS Signed: 05/06/2022 3:54:21 PM By: [name] By: [name] on 05/06/2022 10:23:27 -------------------------------------------------------------------------------- Debridement Details Patient Name: Date of Service: [name], [name] 05/06/2022 10:00 A M Medical Record Number: [id number] Patient Account Number: [id number] Date of Birth/Sex: Treating RN: 1970/05/11 (52 y.o. [name] Primary Care Provider: Tana Conch Other Clinician: Referring Provider: Treating Provider/Extender: Arvin Collard in Treatment: 43 Debridement Performed for Assessment: Wound #4 Right,Distal,Lateral Foot Performed By: Physician Duanne Guess, MD Debridement Type: Debridement Severity of Tissue Pre Debridement: Fat layer exposed Level of Consciousness (Pre-procedure): Awake and Alert Pre-procedure Verification/Time Out Yes - 10:25 Taken: Start Time: 10:25 Pain Control: Lidocaine 4% T opical Solution T Area Debrided (L x W): otal 1 (cm) x 2 (cm) = 2 (cm) Tissue and other material debrided: Non-Viable, Slough, Skin: Epidermis, Slough Level: Skin/Epidermis Debridement Description: Selective/Open Wound Instrument: Curette Bleeding: Minimum Hemostasis Achieved: Pressure Response to Treatment: Procedure was tolerated well Level of Consciousness (Post- Awake and Alert procedure): Post Debridement Measurements of Total  Wound Length: (cm) 1 Width: (cm) 2 Depth: (cm) 0.1 Volume: (cm) 0.157 Character of Wound/Ulcer Post Debridement: Improved Severity of Tissue Post Debridement: Fat layer exposed Post Procedure Diagnosis Same as Pre-procedure Notes scribed for Dr. Lady Gary by [name], RN Electronic Signature(s) Signed: 05/06/2022 10:57:43 AM By: Duanne Guess MD FACS Signed: 05/06/2022 3:54:21 PM By: [name] Entered By: [name] on 05/06/2022 10:26:13 -------------------------------------------------------------------------------- HPI Details Patient Name: Date of Service: [name], [name] 05/06/2022 10:00 A M Medical Record Number: [id number] Patient Account Number: [id number] Date of Birth/Sex: Treating RN:  January 14, 1971 (52 y.o. 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 The most recent amputation was in August 2022. At that operation, antibiotic beads were placed in the wound. He has been managed by podiatry for his procedures and management of his wounds. He has been in a Scientist, water quality. He is on oral doxycycline. They have been using Betadine wet to dry dressings along with Iodosorb. The patient states that when he thinks the wound is getting too dry, he applies topical Neosporin. At his last visit, on June [name], Thorvald Z ([id number]) [phone].pdf Page 3 of 12 7 of this year, the podiatrist determined that he felt the wound was stalled and referred him to wound care for additional evaluation and management. An MRI has been ordered, but not yet scheduled or performed. Pathology  from his operation in August 2022 demonstrated findings consistent with chronic osteomyelitis. Today, there is a large irregular wound on the plantar surface of his right foot, at about the level of the fifth metatarsal head. This tracks through to a pinpoint opening on the dorsal lateral portion of his foot. The intake nurse reported purulent drainage. There is some malodor from the wound. No frank necrosis identified. 07/11/2021: Today, the wounds do not connect. I attempted multiple times from various directions and the shared tunnel is no longer open. He has some slough accumulation on the dorsal part of his foot as well as slough and callus buildup on the plantar surface. His MRI is scheduled for June 29. No purulent drainage or malodor appreciated today. 07/19/2021: The lateral foot wound has closed and there is no tunnel connecting the plantar foot wound to that site. The plantar foot wound still probes quite deeply, however. There is some slough, eschar, and nonviable tissue accumulated in the wound bed. No malodorous drainage present. His MRI was performed yesterday and is consistent with fairly extensive osteomyelitis. 07/29/2021: The patient has an appointment with infectious disease on July 18 to treat his osteomyelitis. The plantar wound still probes quite deeply, approaching bone. The orifice has narrowed quite substantially, however, making it more difficult for him to pack. 08/05/2021: The tunnel connecting the lateral foot wound to the plantar foot wound has reopened. He sees infectious disease tomorrow to discuss long-term antibiotic treatment for osteomyelitis. There was a bit of murky drainage in the wound, but this was noted after he had had topical lidocaine applied so may have just been a blob of the anesthetic. No odor or frank purulent drainage. The wound probes deeply at the midfoot approaching bone. He does have MRI results confirming his diagnosis of osteomyelitis. He has  failed to progress with conventional treatment and I think his best chance for preservation of the foot is to initiate hyperbaric oxygen therapy. 08/13/2021: The tunnel connecting the 2 wounds has closed again. He has a PICC line and is getting IV daptomycin and cefepime. EKG and chest x-ray are within normal limits. He is scheduled to start hyperbaric oxygen therapy tomorrow. The wound in his midfoot probes deeply, approaching bone. The skin at the orifice continues to heaped up and threatens to close over despite the large cavity within. No erythema, induration, or purulent drainage. The wound on his  lateral foot is fairly small and quite clean. 08/19/2021: The lateral foot wound has nearly closed. The wound in his midfoot does not probe quite as deeply today. The skin at the orifice continues to try to roll in and obscure the opening. No frankly necrotic tissue appreciated. He is tolerating hyperbaric oxygen therapy. 08/26/2021: The lateral foot wound has closed completely. The wound in his midfoot is shallower again today. The wound orifice is contracting. We are using gentamicin and silver alginate. He continues on IV daptomycin and cefepime and is tolerating hyperbaric oxygen therapy without difficulty. 09/02/2021: His foot is a little bit sore today but he was up walking on it all weekend doing back to school shopping with his daughter. The tunneling is down to about 3 cm and I do not appreciate bone at the tip of the probe. The wound is clean but has some callus creating an overhanging lip at the distal aspect. He continues to receive hyperbaric oxygen therapy as well as IV daptomycin and cefepime. 09/09/2021: His wound continues to improve. The tunnel has come in by 0.9 cm. He continues to form callus around the orifice of the wound. He is tolerating hyperbaric oxygen therapy along with his IV antibiotics. 09/16/2021: The tunneling is down to just half a centimeter. He continues to build up callus  around the orifice of the wound. He completed his course of IV antibiotics and his PICC line is scheduled to be removed this Friday. He is tolerating hyperbaric oxygen without difficulty. We have been applying topical gentamicin and silver alginate to his wound. 09/24/2021: The wound depth has come in even further. There is a little bit of callus buildup around the orifice. The opening is quite narrow, at this point. 09/30/2021: The wound depth is about the same this week. The opening continues to contract with callus accumulation. There is some discoloration of the skin on the lateral part of his foot and he admitted to walking more than usual this weekend and also wearing a different pair shoes. He continues to tolerate hyperbaric oxygen therapy without difficulty. 10/07/2021: The wound depth has contracted a bit and is now approximately 1 to 1.5 cm. The orifice of the wound continues to try and close in over the space. There is just some callus and skin around the margins obscuring the orifice. 10/14/2021: The wound depth has come in a little bit more. The orifice of the wound is rolling inwards with epithelium and callus, making it difficult to pack the wound. 10/21/2021: There is still 1 portion of the wound, at the lateral aspect, that is still a couple of centimeters deep. The architecture of the wound makes this somewhat challenging to access and pack. Everything else looks clean. He has his usual accumulation of callus and skin, narrowing the orifice. 10/29/2021: No significant change to the depth of the wound. The orifice continues to contract with callus and skin narrowing the opening. His wife has been packing the wound and doing an excellent job. 11/05/2021: The depth of the wound has come in by about a centimeter. There is less callus accumulation around the orificedespite this, the wound is narrower today. We are still awaiting insurance approval for Kerecis powder. 11/12/2021: The depth of  the wound continues to contract. He has accumulated some callus around the orifice, as usual. We have received a verbal confirmation that he is approved for Kerecis, but no formal paperwork has yet been received. 11/19/2021: The depth continues to come in. There is callus accumulation  around the orifice, as usual. He has been formally approved for Kerecis, but unfortunately we do not have a billing mechanism for the powder in [employer] yet. We are working to address this so we can begin treatment. 11/25/2021: Continued filling in of the depth of the wound. Continued buildup of callus around the orifice. No concern for infection. As we do not have a way to bill for Kerecis powder, but are capable of billing for the [facility] sheet, we are going to use that on him instead. 12/03/2021: The depth of the wound continues to fill in. As usual, he has accumulated callus and skin around the orifice of the wound. No concern for infection. He will complete his hyperbaric oxygen therapy this week. 12/17/2021: The wound depth continues to contract. There is callus and skin accumulation around the orifice of the wound, as per usual. There is more epithelium on the actual wound surface. Unfortunately, his Darlen Round was not ordered. 12/24/2021: The wound depth has come in a little bit more this week. There is less skin accumulation around the orifice, but still some callus. On questioning, he admits to spending a fair amount of time on his feet. 12/12; this is a very difficult wound to assess size slitlike wound with some depth but minimal opening. I applied Kerecis No. 4 12/18; Kerecis No. 5 01/15/2022: His wound measured deeper today. There is also some evidence of pressure induced tissue injury around the lateral aspect of his foot. The patient reports that he was wearing different shoes than usual and felt like his foot was rolling a lot over the weekend. There is heavy callus around the orifice of the [name], L[place] M  ([id number]) [id number] Page 4 of 12 wound. No concern for infection. 01/21/2022: The depth decreased today from 1.7 to 1.0 cm. Significantly less tissue damage as compared to last week. Still with callus and skin accumulation around the orifice. 01/29/2022: The wound continues to contract and the depth is coming in further. We are still working on getting [employer] for him. 02/04/2022: No real change this week. Still working on the [employer]. 02/11/2022: No significant changes again. We are still working on the [employer]. 02/18/2022: No change to the wound. We have submitted in IVR for Epicord. 02/25/2022: Unfortunately, his wound has deteriorated over the past week. It is deeper and is approaching bone once again. There is more slough accumulation and the tissue shows signs of pressure injury. He says that he has been trying to wear his new work boots, which are waterproof. He says they make his feet sweat excessively. I think our main challenge with him is that we just are not able to adequately offload him while he is still working and on his feet through much of the day. He is willing to consider total contact casting, but will need to make some arrangements with work. On a more positive note, however, he has been approved for Epicord. 03/03/2022: The wound probed a little bit deeper again today. We are planning to put him in a total contact cast as soon as he has cleared it with work to take some time off. 03/10/2022: The wound was a little bit shallower today and the overall appearance is significantly improved. We are planning to put him in a cast today. 03/12/2022: He is here for his first total contact cast change. He denies any issues with pressure, rubbing, or pain. 03/17/2022: For some reason, the wound probes deeper today. There is some slough and  nonviable subcutaneous tissue around the opening. He denies any difficulty with the total contact  cast. 03/24/2022: The wound is shallower today. There is better granulation tissue visible on the wound surface. 03/31/2022: His cast protector got a hole in it and his cast got wet. His foot is macerated. The wound measured a little deeper today. There is some hypertrophic granulation tissue at the orifice of his wound. 3/18; patient presents for follow-up. He had no issues with the total contact cast. We have been using epi cord to the wound bed. Overall wound is stable. Patient denies signs of infection. 04/14/2022: The depth of the wound has rather remarkably decreased to 1.3 cm today. The orifice of the wound has also contracted quite a bit. 04/21/2022: The depth of the wound has decreased even further. The orifice is also smaller. The wound is very clean. 04/28/2022: The depth of the wound is down to 0.5 cm. The orifice continues to contract. 05/06/2022: The primary wound is substantially smaller and shallower today. Unfortunately, he developed a blister distal to this on the plantar surface of his foot. The tissue breakdown underneath the blister does extend into the fat layer in 1 area over a bony prominence from his collapsed arch. The remainder of the tissue underneath the blister is intact skin. Electronic Signature(s) Signed: 05/06/2022 10:54:07 AM By: Duanne Guess MD FACS Entered By: Duanne Guess on 05/06/2022 10:54:07 -------------------------------------------------------------------------------- Physical Exam Details Patient Name: Date of Service: [name], [name] 05/06/2022 10:00 A M Medical Record Number: [id number] Patient Account Number: [id number] Date of Birth/Sex: Treating RN: 05-13-1970 (52 y.o. M) Primary Care Provider: Tana Conch Other Clinician: Referring Provider: Treating Provider/Extender: Arvin Collard in Treatment: 5 Constitutional Hypertensive, asymptomatic. . . . no acute distress. Respiratory Normal work of breathing on room  air. Notes 05/06/2022: The primary wound is substantially smaller and shallower today. Unfortunately, he developed a blister distal to this on the plantar surface of his foot. The tissue breakdown underneath the blister does extend into the fat layer in 1 area over a bony prominence from his collapsed arch. The remainder of the tissue underneath the blister is intact skin. Electronic Signature(s) Signed: 05/06/2022 10:54:49 AM By: Duanne Guess MD FACS Entered By: Duanne Guess on 05/06/2022 10:54:48 [name], Jasmeet M ([id number]) [id number] Page 5 of 12 -------------------------------------------------------------------------------- Physician Orders Details Patient Name: Date of Service: [name], [name] 05/06/2022 10:00 A M Medical Record Number: [id number] Patient Account Number: [id number] Date of Birth/Sex: Treating RN: 01-29-1970 (52 y.o. 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 - Dr Lady Gary RM 2 Return A Anesthetic Wound #2 Right,Lateral,Plantar Foot (In clinic) Topical Lidocaine 4% applied to wound bed Cellular or Tissue Based Products Cellular or Tissue Based Product Type: - Epicord #11 Cellular or Tissue Based Product applied to wound bed, secured with steri-strips, cover with Adaptic or Mepitel. (DO NOT REMOVE). Bathing/ Shower/ Hygiene May shower with protection but do not get wound  dressing(s) wet. Protect dressing(s) with water repellant cover (for example, large plastic bag) or  a cast cover and may then take shower. Off-Loading Total Contact Cast to Right Lower Extremity Other: - Try to not stand on your feet too much, minimize walking Wound Treatment Wound #2 - Foot Wound Laterality: Plantar, Right, Lateral Cleanser: Soap and Water 1 x Per Week/30 Days Discharge Instructions: May shower and wash wound with dial antibacterial soap and water prior to dressing change. Cleanser: Wound Cleanser (Generic) 1 x Per Week/30 Days Discharge Instructions: Cleanse the wound with wound cleanser prior to applying a clean dressing using gauze sponges, not tissue or cotton balls. Prim Dressing: Epicord ary 1 x Per Week/30 Days Secondary Dressing: ADAPTIC TOUCH 3x4.25 in 1 x Per Week/30 Days Discharge Instructions: Apply over primary dressing as directed. Secondary Dressing: Woven Gauze Sponges 2x2 in 1 x Per Week/30 Days Discharge Instructions: moistened with saline. Apply over primary dressing as directed. Secured With: Paper Tape, 2x10 (in/yd) (Generic) 1 x Per Week/30 Days Discharge Instructions: Secure dressing with tape as directed. Compression Wrap: Kerlix Roll 4.5x3.1 (in/yd) (Generic) 1 x Per Week/30 Days Discharge Instructions: Apply Kerlix and Coban compression as directed. Wound #4 - Foot Wound Laterality: Right, Lateral, Distal Cleanser: Soap and Water 1 x Per Week/30 Days Discharge Instructions: May shower and wash wound with dial antibacterial soap and water prior to dressing change. Cleanser: Wound Cleanser (Generic) 1 x Per Week/30 Days Discharge Instructions: Cleanse the wound with wound cleanser prior to applying a clean dressing using gauze sponges, not tissue or cotton balls. Prim Dressing: Maxorb Extra Ag+ Alginate Dressing, 2x2 (in/in) 1 x Per Week/30 Days ary Discharge Instructions: Apply to wound bed as instructed Secondary Dressing: ADAPTIC TOUCH 3x4.25  in 1 x Per Week/30 Days Discharge Instructions: Apply over primary dressing as directed. Secondary Dressing: Woven Gauze Sponges 2x2 in 1 x Per Week/30 Days [name], Andreu Z ([id number]) [id number] Page 6 of 12 Discharge Instructions: moistened with saline. Apply over primary dressing as directed. Secured With: Paper Tape, 2x10 (in/yd) (Generic) 1 x Per Week/30 Days Discharge Instructions: Secure dressing with tape as directed. Compression Wrap: Kerlix Roll 4.5x3.1 (in/yd) (Generic) 1 x Per Week/30 Days Discharge Instructions: Apply Kerlix and Coban compression as directed. Patient Medications llergies: No Known Allergies A Notifications Medication Indication Start End 05/06/2022 lidocaine DOSE topical 4 % cream - cream topical Electronic Signature(s) Signed: 05/06/2022 10:57:43 AM By: Duanne Guess MD FACS Entered By: Duanne Guess on 05/06/2022 10:55:13 -------------------------------------------------------------------------------- Problem List Details Patient Name: Date of Service: [name], [name] 05/06/2022 10:00 A M Medical Record Number: [id number] Patient Account Number: [id number] Date of Birth/Sex: Treating RN: Feb 11, 1970 (52 y.o. 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 05/06/2022 10:00 A M Medical Record Number: [id number] Patient Account Number: [id number] Date of Birth/Sex: Treating RN: 04/02/1970 (52  y.o. 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 The most recent amputation was in August 2022. At that operation, antibiotic beads were placed in the wound. He has been managed by podiatry for his procedures and management of his wounds. He has been in a Scientist, water quality. He is on oral doxycycline. They have been using Betadine wet to dry dressings along with Iodosorb. The patient states that when he thinks the wound is getting too dry, he applies topical Neosporin. At his last visit, on June 7 of this year, the podiatrist determined that he felt the wound was stalled and referred him to wound care for additional evaluation and management. An MRI has been ordered, but not yet scheduled or performed. Pathology from his operation in August 2022 demonstrated findings consistent with chronic osteomyelitis. Today, there is a large irregular wound on the plantar surface of his right foot, at about the level of the fifth  metatarsal head. This tracks through to a pinpoint opening on the dorsal lateral portion of his foot. The intake nurse reported purulent drainage. There is some malodor from the wound. No frank necrosis identified. 07/11/2021: Today, the wounds do not connect. I attempted multiple times from various directions and the shared tunnel is no longer open. He has some slough accumulation on the dorsal part of his foot as well as slough and callus buildup on the plantar surface. His MRI is scheduled for June 29. No purulent drainage or malodor appreciated today. 07/19/2021: The lateral foot wound has closed and there is no tunnel connecting the plantar foot wound to that site. The plantar foot wound still probes quite deeply, however. There is some slough, eschar, and nonviable tissue accumulated in the wound bed. No malodorous drainage present. His MRI was performed yesterday and is consistent with fairly extensive osteomyelitis. 07/29/2021: The patient has an appointment with infectious disease on July 18 to treat his osteomyelitis. The plantar wound still probes quite deeply, approaching bone. The orifice has narrowed quite substantially, however, making it more difficult for him to pack. 08/05/2021: The tunnel connecting the lateral foot wound to the plantar foot wound has reopened. He sees infectious disease tomorrow to discuss long-term antibiotic treatment for osteomyelitis. There was a bit of murky drainage in the wound, but this was noted after he had had topical lidocaine applied so may have just been a blob of the anesthetic. No odor or frank purulent drainage. The wound probes deeply at the midfoot approaching bone. He does have MRI results confirming his diagnosis of osteomyelitis. He has failed to progress with conventional treatment and I think his best chance for preservation of the foot is to initiate hyperbaric oxygen therapy. 08/13/2021: The tunnel connecting the 2 wounds has closed again. He  has a PICC line and is getting IV daptomycin and cefepime. EKG and chest x-ray are within normal limits. He is scheduled to start hyperbaric oxygen therapy tomorrow. The wound in his midfoot probes deeply, approaching bone. The skin at the orifice continues to heaped up and threatens to close over despite the  large cavity within. No erythema, induration, or purulent drainage. The wound on his lateral foot is fairly small and quite clean. 08/19/2021: The lateral foot wound has nearly closed. The wound in his midfoot does not probe quite as deeply today. The skin at the orifice continues to try to roll in and obscure the opening. No frankly necrotic tissue appreciated. He is tolerating hyperbaric oxygen therapy. 08/26/2021: The lateral foot wound has closed completely. The wound in his midfoot is shallower again today. The wound orifice is contracting. We are using gentamicin and silver alginate. He continues on IV daptomycin and cefepime and is tolerating hyperbaric oxygen therapy without difficulty. 09/02/2021: His foot is a little bit sore today but he was up walking on it all weekend doing back to school shopping with his daughter. The tunneling is down to about 3 cm and I do not appreciate bone at the tip of the probe. The wound is clean but has some callus creating an overhanging lip at the distal aspect. He continues to receive hyperbaric oxygen therapy as well as IV daptomycin and cefepime. 09/09/2021: His wound continues to improve. The tunnel has come in by 0.9 cm. He continues to form callus around the orifice of the wound. He is tolerating hyperbaric oxygen therapy along with his IV antibiotics. 09/16/2021: The tunneling is down to just half a centimeter. He continues to build up callus around the orifice of the wound. He completed his course of IV antibiotics and his PICC line is scheduled to be removed this Friday. He is tolerating hyperbaric oxygen without difficulty. We have been applying  topical gentamicin and silver alginate to his wound. 09/24/2021: The wound depth has come in even further. There is a little bit of callus buildup around the orifice. The opening is quite narrow, at this point. 09/30/2021: The wound depth is about the same this week. The opening continues to contract with callus accumulation. There is some discoloration of the skin on the lateral part of his foot and he admitted to walking more than usual this weekend and also wearing a different pair shoes. He continues to tolerate hyperbaric oxygen therapy without difficulty. 10/07/2021: The wound depth has contracted a bit and is now approximately 1 to 1.5 cm. The orifice of the wound continues to try and close in over the space. There is just some callus and skin around the margins obscuring the orifice. 10/14/2021: The wound depth has come in a little bit more. The orifice of the wound is rolling inwards with epithelium and callus, making it difficult to pack the wound. 10/21/2021: There is still 1 portion of the wound, at the lateral aspect, that is still a couple of centimeters deep. The architecture of the wound makes this somewhat challenging to access and pack. Everything else looks clean. He has his usual accumulation of callus and skin, narrowing the orifice. 10/29/2021: No significant change to the depth of the wound. The orifice continues to contract with callus and skin narrowing the opening. His wife has been packing the wound and doing an excellent job. 11/05/2021: The depth of the wound has come in by about a centimeter. There is less callus accumulation around the orificeoodespite this, the wound is narrower today. We are still awaiting insurance approval for Kerecis powder. 11/12/2021: The depth of the wound continues to contract. He has accumulated some callus around the orifice, as usual. We have received a verbal confirmation that he is approved for Kerecis, but no formal paperwork has yet been  received. 11/19/2021: The depth continues to come in. There is callus accumulation around the orifice, as usual. He has been formally approved for Kerecis, but [name], Erikson M ([id number]) [id number] Page 8 of 12 unfortunately we do not have a billing mechanism for the powder in [facility] yet. We are working to address this so we can begin treatment. 11/25/2021: Continued filling in of the depth of the wound. Continued buildup of callus around the orifice. No concern for infection. As we do not have a way to bill for Kerecis powder, but are capable of billing for the [facility] sheet, we are going to use that on him instead. 12/03/2021: The depth of the wound continues to fill in. As usual, he has accumulated callus and skin around the orifice of the wound. No concern for infection. He will complete his hyperbaric oxygen therapy this week. 12/17/2021: The wound depth continues to contract. There is callus and skin accumulation around the orifice of the wound, as per usual. There is more epithelium on the actual wound surface. Unfortunately, his Darlen Round was not ordered. 12/24/2021: The wound depth has come in a little bit more this week. There is less skin accumulation around the orifice, but still some callus. On questioning, he admits to spending a fair amount of time on his feet. 12/12; this is a very difficult wound to assess size slitlike wound with some depth but minimal opening. I applied Kerecis No. 4 12/18; Kerecis No. 5 01/15/2022: His wound measured deeper today. There is also some evidence of pressure induced tissue injury around the lateral aspect of his foot. The patient reports that he was wearing different shoes than usual and felt like his foot was rolling a lot over the weekend. There is heavy callus around the orifice of the wound. No concern for infection. 01/21/2022: The depth decreased today from 1.7 to 1.0 cm. Significantly less tissue damage as compared to  last week. Still with callus and skin accumulation around the orifice. 01/29/2022: The wound continues to contract and the depth is coming in further. We are still working on getting [employer] for him. 02/04/2022: No real change this week. Still working on the [employer]. 02/11/2022: No significant changes again. We are still working on the [employer]. 02/18/2022: No change to the wound. We have submitted in IVR for Epicord. 02/25/2022: Unfortunately, his wound has deteriorated over the past week. It is deeper and is approaching bone once again. There is more slough accumulation and the tissue shows signs of pressure injury. He says that he has been trying to wear his new work boots, which are waterproof. He says they make his feet sweat excessively. I think our main challenge with him is that we just are not able to adequately offload him while he is still working and on his feet through much of the day. He is willing to consider total contact casting, but will need to make some arrangements with work. On a more positive note, however, he has been approved for Epicord. 03/03/2022: The wound probed a little bit deeper again today. We are planning to put him in a total contact cast as soon as he has cleared it with work to take some time off. 03/10/2022: The wound was a little bit shallower today and the overall appearance is significantly improved. We are planning to put him in a cast today. 03/12/2022: He is here for his first total contact cast change. He denies any issues with pressure, rubbing, or pain. 03/17/2022: For  some reason, the wound probes deeper today. There is some slough and nonviable subcutaneous tissue around the opening. He denies any difficulty with the total contact cast. 03/24/2022: The wound is shallower today. There is better granulation tissue visible on the wound surface. 03/31/2022: His cast protector got a hole in it and his cast got wet. His foot is macerated. The  wound measured a little deeper today. There is some hypertrophic granulation tissue at the orifice of his wound. 3/18; patient presents for follow-up. He had no issues with the total contact cast. We have been using epi cord to the wound bed. Overall wound is stable. Patient denies signs of infection. 04/14/2022: The depth of the wound has rather remarkably decreased to 1.3 cm today. The orifice of the wound has also contracted quite a bit. 04/21/2022: The depth of the wound has decreased even further. The orifice is also smaller. The wound is very clean. 04/28/2022: The depth of the wound is down to 0.5 cm. The orifice continues to contract. 05/06/2022: The primary wound is substantially smaller and shallower today. Unfortunately, he developed a blister distal to this on the plantar surface of his foot. The tissue breakdown underneath the blister does extend into the fat layer in 1 area over a bony prominence from his collapsed arch. The remainder of the tissue underneath the blister is intact skin. Patient History Information obtained from Patient. Social History Never smoker, Marital Status - Married, Alcohol Use - Never, Drug Use - No History, Caffeine Use - Daily - T coffee. ea; Medical History Endocrine Patient has history of Type II Diabetes Hospitalization/Surgery History - Amuptation of 3rd,4th and 5th toes of Right foot;Oral Surgery;Anal Fissure surgery; Cholecystectomy. [address] [name], Arnulfo Z ([id number]) [id number] Page 9 of 12 Constitutional Hypertensive, asymptomatic. no acute distress. Vitals Time Taken: 9:58 AM, Height: 72 in, Weight: 312 lbs, BMI: 42.3, Temperature: 98.2 F, Pulse: 73 bpm, Respiratory Rate: 16 breaths/min, Blood Pressure: 160/84 mmHg. Respiratory Normal work of breathing on room air. General Notes: 05/06/2022: The primary wound is substantially smaller and shallower today. Unfortunately, he developed a blister distal to this on the  plantar surface of his foot. The tissue breakdown underneath the blister does extend into the fat layer in 1 area over a bony prominence from his collapsed arch. The remainder of the tissue underneath the blister is intact skin. Integumentary (Hair, Skin) Wound #2 status is Open. Original cause of wound was Gradually Appeared. The date acquired was: 12/19/2016. The wound has been in treatment 43 weeks. The wound is located on the Right,Lateral,Plantar Foot. The wound measures 0.2cm length x 0.2cm width x 0.5cm depth; 0.031cm^2 area and 0.016cm^3 volume. There is Fat Layer (Subcutaneous Tissue) exposed. There is no tunneling or undermining noted. There is a medium amount of serosanguineous drainage noted. The wound margin is thickened. There is large (67-100%) red granulation within the wound bed. There is no necrotic tissue within the wound bed. The periwound skin appearance had no abnormalities noted for color. The periwound skin appearance exhibited: Callus, Maceration. Periwound temperature was noted as No Abnormality. Wound #4 status is Open. Original cause of wound was Blister. The date acquired was: 05/06/2022. The wound is located on the Right,Distal,Lateral Foot. The wound measures 1cm length x 2cm width x 0.1cm depth; 1.571cm^2 area and 0.157cm^3 volume. There is Fat Layer (Subcutaneous Tissue) exposed. There is no tunneling or undermining noted. There is a medium amount of serosanguineous drainage noted. The wound margin is distinct with the outline attached to  the wound base. There is large (67-100%) red granulation within the wound bed. There is a small (1-33%) amount of necrotic tissue within the wound bed including Adherent Slough. The periwound skin appearance had no abnormalities noted for color. The periwound skin appearance exhibited: Callus, Dry/Scaly. Periwound temperature was noted as No Abnormality. 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 There was a Selective/Open Wound Skin/Epidermis Debridement with a total area of 2 sq cm performed by Duanne Guess, MD. With the following instrument(s): Curette to remove Non-Viable tissue/material. Material removed includes [facility] and Skin: Epidermis and after achieving pain control using Lidocaine 4% Topical Solution. No specimens were taken. A time out was conducted at 10:25, prior to the start of the procedure. A Minimum amount of bleeding was controlled with Pressure. The procedure was tolerated well. Post Debridement Measurements: 1cm length x 2cm width x 0.1cm depth; 0.157cm^3 volume. Character of Wound/Ulcer Post Debridement is improved. Severity of Tissue Post Debridement is: Fat layer exposed. Post procedure Diagnosis Wound #4: Same as Pre-Procedure General Notes: scribed for Dr. Lady Gary by [name], RN. Wound #2 Pre-procedure diagnosis of Wound #2 is a Diabetic Wound/Ulcer of the Lower Extremity located on the Right,Lateral,Plantar Foot. A skin graft procedure using a bioengineered skin substitute/cellular or tissue based product was performed by Duanne Guess, MD with the following instrument(s): Forceps and Scissors. Epicord was applied and secured with Steri-Strips. 6.5 sq cm of product was utilized and 0 sq cm was wasted. Post Application, adaptic was applied. A Time Out was conducted at 10:18, prior to the start of the procedure. The procedure was tolerated well with a pain level of 0 throughout and a pain level of 0 following  the procedure. Post procedure Diagnosis Wound #2: Same as Pre-Procedure General Notes: scribed for Dr. Lady Gary by [name], RN. Pre-procedure diagnosis of Wound #2 is a Diabetic Wound/Ulcer of the Lower Extremity located on the Right,Lateral,Plantar Foot . There was a T Contact otal Cast Procedure by Duanne Guess, MD. Post procedure Diagnosis Wound #2: Same as Pre-Procedure Notes: scribed for Dr. Lady Gary by [name], RN. Plan Follow-up Appointments: Return Appointment in 1 week. - Dr Lady Gary RM 2 Anesthetic: Wound #2 Right,Lateral,Plantar Foot: (In clinic) Topical Lidocaine 4% applied to wound bed Cellular or Tissue Based Products: [name], Mitsuru M ([id number]) [id number] Page 10 of 12 Cellular or Tissue Based Product Type: - Epicord #11 Cellular or Tissue Based Product applied to wound bed, secured with steri-strips, cover with Adaptic or Mepitel. (DO NOT REMOVE). Bathing/ Shower/ Hygiene: May shower with protection but do not get wound dressing(s) wet. Protect dressing(s) with water repellant cover (for example, large plastic bag) or a cast cover and may then take shower. Off-Loading: T Contact Cast to Right Lower Extremity otal Other: - Try to not stand on your feet too much, minimize walking The following medication(s) was prescribed: lidocaine topical 4 % cream cream topical was prescribed at facility WOUND #2: - Foot Wound Laterality: Plantar, Right, Lateral Cleanser: Soap and Water 1 x Per Week/30 Days Discharge Instructions: May shower and wash wound with dial antibacterial soap and  water prior to dressing change. Cleanser: Wound Cleanser (Generic) 1 x Per Week/30 Days Discharge Instructions: Cleanse the wound with wound cleanser prior to applying a clean dressing using gauze sponges, not tissue or cotton balls. Prim Dressing: Epicord 1 x Per Week/30 Days ary Secondary Dressing: ADAPTIC TOUCH 3x4.25 in 1 x Per Week/30  Days Discharge Instructions: Apply over primary dressing as directed. Secondary Dressing: Woven Gauze Sponges 2x2 in 1 x Per Week/30 Days Discharge Instructions: moistened with saline. Apply over primary dressing as directed. Secured With: Paper T ape, 2x10 (in/yd) (Generic) 1 x Per Week/30 Days Discharge Instructions: Secure dressing with tape as directed. Com pression Wrap: Kerlix Roll 4.5x3.1 (in/yd) (Generic) 1 x Per Week/30 Days Discharge Instructions: Apply Kerlix and Coban compression as directed. WOUND #4: - Foot Wound Laterality: Right, Lateral, Distal Cleanser: Soap and Water 1 x Per Week/30 Days Discharge Instructions: May shower and wash wound with dial antibacterial soap and water prior to dressing change. Cleanser: Wound Cleanser (Generic) 1 x Per Week/30 Days Discharge Instructions: Cleanse the wound with wound cleanser prior to applying a clean dressing using gauze sponges, not tissue or cotton balls. Prim Dressing: Maxorb Extra Ag+ Alginate Dressing, 2x2 (in/in) 1 x Per Week/30 Days ary Discharge Instructions: Apply to wound bed as instructed Secondary Dressing: ADAPTIC TOUCH 3x4.25 in 1 x Per Week/30 Days Discharge Instructions: Apply over primary dressing as directed. Secondary Dressing: Woven Gauze Sponges 2x2 in 1 x Per Week/30 Days Discharge Instructions: moistened with saline. Apply over primary dressing as directed. Secured With: Paper T ape, 2x10 (in/yd) (Generic) 1 x Per Week/30 Days Discharge Instructions: Secure dressing with tape as directed. Com pression Wrap: Kerlix Roll 4.5x3.1 (in/yd) (Generic) 1 x Per Week/30 Days Discharge Instructions: Apply Kerlix and Coban compression as directed. 05/06/2022: The primary wound is substantially smaller and shallower today. Unfortunately, he developed a blister distal to this on the plantar surface of his foot. The tissue breakdown underneath the blister does extend into the fat layer in 1 area over a bony prominence from  his collapsed arch. The remainder of the tissue underneath the blister is intact skin. I used scissors and forceps to trim the dead skin away from the blistered area. I then used a curette to debride slough off of the exposed fat layer. We will use silver alginate in this location. The old wound bed was prepared for Epicort application. Epicort was reconstituted with saline and packed into the wound. Everything was secured with Adaptic and Steri-Strips. A total contact cast was then applied in standard fashion. I anticipate the new wound will close in relatively short order and hopefully the wound we have been treating for months will be closed in the next couple of weeks. Follow-up in 1 week's time. Electronic Signature(s) Signed: 05/06/2022 10:56:39 AM By: Duanne Guess MD FACS Entered By: Duanne Guess on 05/06/2022 10:56:39 -------------------------------------------------------------------------------- HxROS Details Patient Name: Date of Service: [name], [name] 05/06/2022 10:00 A M Medical Record Number: [id number] Patient Account Number: [id number] Date of Birth/Sex: Treating RN: 08-Oct-1970 (52 y.o. M) Primary Care Provider: Tana Conch Other Clinician: Referring Provider: Treating Provider/Extender: Arvin Collard in Treatment: 27 Information Obtained From Patient Endocrine Medical History: Positive for: Type II Diabetes Immunizations Pneumococcal Vaccine: Received Pneumococcal Vaccination: Yes Received Pneumococcal Vaccination On or After [address] No [name], Acel M ([id number]) [phone].pdf Page 11 of 12 Implantable Devices None Hospitalization / Surgery History Type of Hospitalization/Surgery Amuptation of 3rd,4th and 5th toes of Right foot;Oral  Surgery;Anal Fissure surgery; Cholecystectomy Family and Social History Never smoker; Marital Status - Married; Alcohol Use: Never; Drug Use: No History; Caffeine  Use: Daily - T coffee; Financial Concerns: No; Food, ea; Clothing or Shelter Needs: No; Support System Lacking: No; Transportation Concerns: No Electronic Signature(s) Signed: 05/06/2022 10:57:43 AM By: Duanne Guess MD FACS Entered By: Duanne Guess on 05/06/2022 10:54:19 -------------------------------------------------------------------------------- Total Contact Cast Details Patient Name: Date of Service: [name], [name] 05/06/2022 10:00 A M Medical Record Number: [id number] Patient Account Number: [id number] Date of Birth/Sex: Treating RN: 1970-07-20 (52 y.o. [name] Primary Care Provider: Tana Conch Other Clinician: Referring Provider: Treating Provider/Extender: Arvin Collard in Treatment: 49 T Contact Cast Applied for Wound Assessment: otal Wound #2 Right,Lateral,Plantar Foot Performed By: Physician Duanne Guess, MD Post Procedure Diagnosis Same as Pre-procedure Notes scribed for Dr. Lady Gary by [name], RN Electronic Signature(s) Signed: 05/06/2022 10:57:43 AM By: Duanne Guess MD FACS Signed: 05/06/2022 3:54:21 PM By: [name] Entered By: [name] on 05/06/2022 10:15:25 -------------------------------------------------------------------------------- SuperBill Details Patient Name: Date of Service: [name], [name] 05/06/2022 Medical Record Number: [id number] Patient Account Number: [id number] Date of Birth/Sex: Treating RN: 12-10-1970 (52 y.o. M) Primary Care Provider: Tana Conch Other Clinician: Referring Provider: Treating Provider/Extender: Arvin Collard in Treatment: 43 Diagnosis Coding ICD-10 Codes Code Description [phone] Non-pressure chronic ulcer of other part of right foot with necrosis of bone L97.512 Non-pressure chronic ulcer of other part of right foot with fat layer exposed E11.49 Type 2 diabetes mellitus with other diabetic neurological  complication M86.671 Other chronic osteomyelitis, right ankle and foot Facility Procedures : CPT4 Code: [id number] Description: [id number] Epicord 2cm x 3cm - per sqcm Modifier: Quantity: 6.5 : [name], E CPT4 Code: 84132440 DWARD M [id number] I Description: 15275 - SKIN SUB GRAFT FACE/NK/HF/G ICD-10 Diagnosis Description L97.514 Non-pressure chronic ulcer of other part of right foot with necrosis of bone ) [phone] 7597 - DEBRIDE WOUND 1ST 20 SQ CM OR < CD-10 Diagnosis Description  L97.512 Non-pressure chronic ulcer of other part of right foot with fat layer exposed Modifier: [id number] 1 Quantity: 1 7.pdf Page 12 of 12 : CPT4 Code: [id number] 2 I Description: 9445 - APPLY TOTAL CONTACT LEG CAST CD-10 Diagnosis Description L97.514 Non-pressure chronic ulcer of other part of right foot with necrosis of bone Modifier: 1 Quantity: Physician Procedures : CPT4 Code Description Modifier [id number] - WC PHYS LEVEL 4 - EST PT 25 ICD-10 Diagnosis Description 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 mellitus with other diabetic neurological complication M86.671 Other chronic osteomyelitis, right ankle and foot Quantity: 1 : [id number] 15275 - WC PHYS SKIN SUB GRAFT FACE/NK/HF/G ICD-10 Diagnosis Description L97.514 Non-pressure chronic ulcer of other part of right foot with necrosis of bone Quantity: 1 : [id number] 97597 - WC PHYS DEBR WO ANESTH 20 SQ CM ICD-10 Diagnosis Description L97.512 Non-pressure chronic ulcer of other part of right foot with fat layer exposed Quantity: 1 : [id number] 29445 - WC PHYS APPLY TOTAL CONTACT CAST ICD-10 Diagnosis Description L97.514 Non-pressure chronic ulcer of other part of right foot with necrosis of bone Quantity: 1 Electronic Signature(s) Signed: 05/06/2022 10:57:14 AM By: Duanne Guess MD  FACS Entered By: Duanne Guess on 05/06/2022 10:57:12

## 2022-05-06 NOTE — Progress Notes (Signed)
[name], [name] ([id number]) [name] Page 1 of 9 Visit Report for 05/06/2022 Arrival Information Details Patient Name: Date of Service: [name] [name] 05/06/2022 10:00 [name] Medical Record Number: [id number] Patient Account Number: [id number] Date of Birth/Sex: Treating RN: 13-Nov-1970 (52 y.o. [name] Primary Care Nazirah Tri: Tana Conch Other Clinician: Referring Ashleigh Luckow: Treating Shantel Helwig/Extender: Arvin Collard in Treatment: 59 Visit Information History Since Last Visit Added or deleted any medications: No Patient Arrived: Ambulatory Any new allergies or adverse reactions: No Arrival Time: 09:57 Had a fall or experienced change in No Accompanied By: self activities of daily living that may affect Transfer Assistance: None risk of falls: Patient Identification Verified: Yes Signs or symptoms of abuse/neglect since last visito No Secondary Verification Process Completed: Yes Hospitalized since last visit: No Patient Requires Transmission-Based Precautions: No Implantable device outside of the clinic excluding No Patient Has Alerts: Yes cellular tissue based products placed in the center since last visit: Has Dressing in Place as Prescribed: Yes Has Footwear/Offloading in Place as Prescribed: Yes Right: T Contact Cast otal Pain Present Now: No Electronic Signature(s) Signed: 05/06/2022 3:54:21 PM By: [name] Entered By: [name] on 05/06/2022 09:58:34 -------------------------------------------------------------------------------- Encounter Discharge Information Details Patient Name: Date of Service: [name] 05/06/2022 10:00 [name] Medical Record Number: [id number] Patient Account Number: [id number] Date of Birth/Sex: Treating RN: 1970-10-08 (52 y.o. [name] Primary Care Bryson Gavia: Tana Conch Other Clinician: Referring Ciarah Peace: Treating Leon Goodnow/Extender:  Arvin Collard in Treatment: 16 Encounter Discharge Information Items Post Procedure Vitals Discharge Condition: Stable Temperature (F): 98.2 Ambulatory Status: Ambulatory Pulse (bpm): 73 Discharge Destination: Home Respiratory Rate (breaths/min): 16 Transportation: Private Auto Blood Pressure (mmHg): 160/84 Accompanied By: self Schedule Follow-up Appointment: Yes Clinical Summary of Care: Patient Declined Electronic Signature(s) Signed: 05/06/2022 3:54:21 PM By: [name] Entered By: [name] on 05/06/2022 10:50:29 -------------------------------------------------------------------------------- Lower Extremity Assessment Details Patient Name: Date of Service: [name] 05/06/2022 10:00 [name] Medical Record Number: [id number] Patient Account Number: [id number] Date of Birth/Sex: Treating RN: [birth year] (52 y.o. [name] Primary Care Laksh Hinners: Tana Conch Other Clinician: Referring Christropher Gintz: Treating Elridge Stemm/Extender: Arvin Collard in Treatment: 43 Edema Assessment Left: [Left: Right] Franne Forts: :] Assessed: [Left: No] [Right: No] Edema: [Left: N] [Right: o] Calf Left: Right: Point of Measurement: From Medial Instep 40 cm Ankle Left: Right: Point of Measurement: From Medial Instep 24.3 cm Vascular Assessment Pulses: Dorsalis Pedis Palpable: [Right:Yes] Electronic Signature(s) Signed: 05/06/2022 3:54:21 PM By: [name] Entered By: [name] on 05/06/2022 10:08:49 -------------------------------------------------------------------------------- Multi Wound Chart Details Patient Name: Date of Service: [name] 05/06/2022 10:00 [name] Medical Record Number: [id number] Patient Account Number: [id number] Date of Birth/Sex: Treating RN: [birth year] (52 y.o. [name] Primary Care Lajoy Vanamburg: Tana Conch Other Clinician: Referring Deonna Krummel: Treating Dillinger Aston/Extender:  Arvin Collard in Treatment: 43 Vital Signs Height(in): 72 Pulse(bpm): 73 Weight(lbs): 312 Blood Pressure(mmHg): 160/84 Body Mass Index(BMI): 42.3 Temperature(F): 98.2 Respiratory Rate(breaths/min): 16 [2:Photos:] [4:No Photos] [N/A:N/A] Right, Lateral, Plantar Foot Right, Distal, Lateral Foot N/A Wound Location: Gradually Appeared Blister N/A Wounding Event: Diabetic Wound/Ulcer of the Lower Diabetic Wound/Ulcer of the Lower N/A Primary Etiology: Extremity Extremity Type II Diabetes Type II Diabetes N/A Comorbid History: 12/19/2016 05/06/2022 N/A Date Acquired: 43 0 N/A Weeks of Treatment: Open Open N/A Wound Status: No No N/A Wound Recurrence: 0.2x0.2x0.5 1x2x0.1 N/A Measurements L x W x D (cm) 0.031 1.571  N/A A (cm) : rea 0.016 0.157 N/A Volume (cm) : 99.10% N/A N/A % Reduction in A rea: 99.90% N/A N/A % Reduction in Volume: Grade 3 Grade 1 N/A Classification: Medium Medium N/A Exudate A mount: Serosanguineous Serosanguineous N/A Exudate Type: red, brown red, brown N/A Exudate Color: Thickened Distinct, outline attached N/A Wound Margin: Large (67-100%) Large (67-100%) N/A Granulation A mount: Red Red N/A Granulation Quality: None Present (0%) Small (1-33%) N/A Necrotic A mount: Fat Layer (Subcutaneous Tissue): Yes Fat Layer (Subcutaneous Tissue): Yes N/A Exposed Structures: [name], [name] ([id number]) [name] Page 3 of 9 Fascia: No Fascia: No Tendon: No Tendon: No Muscle: No Muscle: No Joint: No Joint: No Bone: No Bone: No Large (67-100%) Small (1-33%) N/A Epithelialization: N/A Debridement - Selective/Open Wound N/A Debridement: Pre-procedure Verification/Time Out N/A 10:25 N/A Taken: N/A Lidocaine 4% Topical Solution N/A Pain Control: N/A Slough N/A Tissue Debrided: N/A Skin/Epidermis N/A Level: N/A 2 N/A Debridement A (sq cm): rea N/A Curette N/A Instrument: N/A  Minimum N/A Bleeding: N/A Pressure N/A Hemostasis A chieved: N/A Procedure was tolerated well N/A Debridement Treatment Response: N/A 1x2x0.1 N/A Post Debridement Measurements L x W x D (cm) N/A 0.157 N/A Post Debridement Volume: (cm) Callus: Yes Callus: Yes N/A Periwound Skin Texture: Maceration: Yes Dry/Scaly: Yes N/A Periwound Skin Moisture: No Abnormalities Noted No Abnormalities Noted N/A Periwound Skin Color: No Abnormality No Abnormality N/A Temperature: Cellular or Tissue Based Product Debridement N/A Procedures Performed: T Contact Cast otal Treatment Notes Wound #2 (Foot) Wound Laterality: Plantar, Right, Lateral Cleanser Soap and Water Discharge Instruction: May shower and wash wound with dial antibacterial soap and water prior to dressing change. Wound Cleanser Discharge Instruction: Cleanse the wound with wound cleanser prior to applying a clean dressing using gauze sponges, not tissue or cotton balls. Peri-Wound Care Topical Primary Dressing Epicord Secondary Dressing ADAPTIC TOUCH 3x4.25 in Discharge Instruction: Apply over primary dressing as directed. Woven Gauze Sponges 2x2 in Discharge Instruction: moistened with saline. Apply over primary dressing as directed. Secured With Paper Tape, 2x10 (in/yd) Discharge Instruction: Secure dressing with tape as directed. Compression Wrap Kerlix Roll 4.5x3.1 (in/yd) Discharge Instruction: Apply Kerlix and Coban compression as directed. Compression Stockings Add-Ons Wound #4 (Foot) Wound Laterality: Right, Lateral, Distal Cleanser Soap and Water Discharge Instruction: May shower and wash wound with dial antibacterial soap and water prior to dressing change. Wound Cleanser Discharge Instruction: Cleanse the wound with wound cleanser prior to applying a clean dressing using gauze sponges, not tissue or cotton balls. Peri-Wound Care Topical Primary Dressing Maxorb Extra Ag+ Alginate Dressing, 2x2  (in/in) Discharge Instruction: Apply to wound bed as instructed [address] [name], [name] ([id number]) [name] Page 4 of 9 ADAPTIC TOUCH 3x4.25 in Discharge Instruction: Apply over primary dressing as directed. Woven Gauze Sponges 2x2 in Discharge Instruction: moistened with saline. Apply over primary dressing as directed. Secured With Paper Tape, 2x10 (in/yd) Discharge Instruction: Secure dressing with tape as directed. Compression Wrap Kerlix Roll 4.5x3.1 (in/yd) Discharge Instruction: Apply Kerlix and Coban compression as directed. Compression Stockings Add-Ons Electronic Signature(s) Signed: 05/06/2022 10:52:38 AM By: Duanne Guess MD FACS Entered By: Duanne Guess on 05/06/2022 10:52:38 -------------------------------------------------------------------------------- Multi-Disciplinary Care Plan Details Patient Name: Date of Service: [name] 05/06/2022 10:00 [name] Medical Record Number: [id number] Patient Account Number: [id number] Date of Birth/Sex: Treating RN: 26-Nov-1970 (52 y.o. [name] Primary Care Damain Broadus: Tana Conch Other Clinician: Referring Dayra Rapley: Treating Kerrigan Gombos/Extender: Arvin Collard in Treatment: 43 Multidisciplinary Care Plan  reviewed with physician Active Inactive Nutrition Nursing Diagnoses: Impaired glucose control: actual or potential Potential for alteratiion in Nutrition/Potential for imbalanced nutrition Goals: Patient/caregiver will maintain therapeutic glucose control Date Initiated: 07/29/2021 Target Resolution Date: 05/23/2022 Goal Status: Active Interventions: Assess patient nutrition upon admission and as needed per policy Provide education on elevated blood sugars and impact on wound healing Treatment Activities: Patient referred to Primary Care Physician for further nutritional evaluation : 07/29/2021 Notes: Wound/Skin Impairment Nursing  Diagnoses: Impaired tissue integrity Goals: Patient/caregiver will verbalize understanding of skin care regimen Date Initiated: 07/04/2021 Target Resolution Date: 05/23/2022 Goal Status: Active Interventions: Assess ulceration(s) every visit Treatment Activities: Skin care regimen initiated : 07/04/2021 [name], [name] ([id number]) [phone].pdf Page 5 of 9 Notes: Electronic Signature(s) Signed: 05/06/2022 3:54:21 PM By: [name] Entered By: [name] on 05/06/2022 10:17:55 -------------------------------------------------------------------------------- Pain Assessment Details Patient Name: Date of Service: [name] 05/06/2022 10:00 [name] Medical Record Number: [id number] Patient Account Number: [id number] Date of Birth/Sex: Treating RN: 02/08/1970 (52 y.o. [name] Primary Care Vasilios Ottaway: Tana Conch Other Clinician: Referring Marymargaret Kirker: Treating Tonnya Garbett/Extender: Arvin Collard in Treatment: 49 Active Problems Location of Pain Severity and Description of Pain Patient Has Paino No Site Locations Rate the pain. Current Pain Level: 0 Pain Management and Medication Current Pain Management: Electronic Signature(s) Signed: 05/06/2022 3:54:21 PM By: [name] Entered By: [name] on 05/06/2022 09:58:55 -------------------------------------------------------------------------------- Patient/Caregiver Education Details Patient Name: Date of Service: [name] 4/16/2024andnbsp10:00 [name] Medical Record Number: [id number] Patient Account Number: [id number] Date of Birth/Gender: Treating RN: 10-17-1970 (52 y.o. [name] Primary Care Physician: Tana Conch Other Clinician: Referring Physician: Treating Physician/Extender: Arvin Collard in Treatment: 2 Education Assessment Education Provided To: Patient Education Topics  Provided Wound/Skin Impairment: Methods: Explain/Verbal Responses: Reinforcements needed, State content correctly [name], [name] ([id number]) [name] Page 6 of 9 Electronic Signature(s) Signed: 05/06/2022 3:54:21 PM By: [name] Entered By: [name] on 05/06/2022 10:18:07 -------------------------------------------------------------------------------- Wound Assessment Details Patient Name: Date of Service: [name] 05/06/2022 10:00 [name] Medical Record Number: [id number] Patient Account Number: [id number] Date of Birth/Sex: Treating RN: 1970-07-01 (52 y.o. [name] Primary Care Abbigal Radich: Tana Conch Other Clinician: Referring Trinika Cortese: Treating Satara Virella/Extender: Arvin Collard in Treatment: 43 Wound Status Wound Number: 2 Primary Etiology: Diabetic Wound/Ulcer of the Lower Extremity Wound Location: Right, Lateral, Plantar Foot Wound Status: Open Wounding Event: Gradually Appeared Comorbid History: Type II Diabetes Date Acquired: 12/19/2016 Weeks Of Treatment: 43 Clustered Wound: No Photos Wound Measurements Length: (cm) 0.2 Width: (cm) 0.2 Depth: (cm) 0.5 Area: (cm) 0.031 Volume: (cm) 0.016 % Reduction in Area: 99.1% % Reduction in Volume: 99.9% Epithelialization: Large (67-100%) Tunneling: No Undermining: No Wound Description Classification: Grade 3 Wound Margin: Thickened Exudate Amount: Medium Exudate Type: Serosanguineous Exudate Color: red, brown Foul Odor After Cleansing: No Slough/Fibrino No Wound Bed Granulation Amount: Large (67-100%) Exposed Structure Granulation Quality: Red Fascia Exposed: No Necrotic Amount: None Present (0%) Fat Layer (Subcutaneous Tissue) Exposed: Yes Tendon Exposed: No Muscle Exposed: No Joint Exposed: No Bone Exposed: No Periwound Skin Texture Texture Color No Abnormalities Noted: No No Abnormalities Noted: Yes Callus: Yes  Temperature / Pain Temperature: No Abnormality Moisture No Abnormalities Noted: No Maceration: Yes Treatment Notes Wound #2 (Foot) Wound Laterality: Plantar, Right, Lateral [name], [name] ([id number]) [name] Page 7 of 9 Cleanser Soap and Water Discharge Instruction: May shower and wash wound with dial antibacterial  soap and water prior to dressing change. Wound Cleanser Discharge Instruction: Cleanse the wound with wound cleanser prior to applying a clean dressing using gauze sponges, not tissue or cotton balls. Peri-Wound Care Topical Primary Dressing Epicord Secondary Dressing ADAPTIC TOUCH 3x4.25 in Discharge Instruction: Apply over primary dressing as directed. Woven Gauze Sponges 2x2 in Discharge Instruction: moistened with saline. Apply over primary dressing as directed. Secured With Paper Tape, 2x10 (in/yd) Discharge Instruction: Secure dressing with tape as directed. Compression Wrap Kerlix Roll 4.5x3.1 (in/yd) Discharge Instruction: Apply Kerlix and Coban compression as directed. Compression Stockings Add-Ons Electronic Signature(s) Signed: 05/06/2022 3:54:21 PM By: [name] Entered By: [name] on 05/06/2022 10:11:07 -------------------------------------------------------------------------------- Wound Assessment Details Patient Name: Date of Service: [name] 05/06/2022 10:00 [name] Medical Record Number: [id number] Patient Account Number: [id number] Date of Birth/Sex: Treating RN: 15-Jan-1971 (52 y.o. [name] Primary Care Chandler Swiderski: Tana Conch Other Clinician: Referring Amariyon Maynes: Treating Waymond Meador/Extender: Arvin Collard in Treatment: 43 Wound Status Wound Number: 4 Primary Etiology: Diabetic Wound/Ulcer of the Lower Extremity Wound Location: Right, Distal, Lateral Foot Wound Status: Open Wounding Event: Blister Comorbid History: Type II Diabetes Date  Acquired: 05/06/2022 Weeks Of Treatment: 0 Clustered Wound: No Wound Measurements Length: (cm) 1 Width: (cm) 2 Depth: (cm) 0.1 Area: (cm) 1.571 Volume: (cm) 0.157 % Reduction in Area: % Reduction in Volume: Epithelialization: Small (1-33%) Tunneling: No Undermining: No Wound Description Classification: Grade 1 Wound Margin: Distinct, outline attached Exudate Amount: Medium Exudate Type: Serosanguineous Exudate Color: red, brown Foul Odor After Cleansing: No Slough/Fibrino Yes Wound Bed Granulation Amount: Large (67-100%) Exposed Structure Granulation Quality: Red Fascia Exposed: No Necrotic Amount: Small (1-33%) Fat Layer (Subcutaneous Tissue) Exposed: Yes Necrotic Quality: Adherent Slough Tendon Exposed: No [name], [name] ([id number]) [name] Page 8 of 9 Muscle Exposed: No Joint Exposed: No Bone Exposed: No Periwound Skin Texture Texture Color No Abnormalities Noted: No No Abnormalities Noted: Yes Callus: Yes Temperature / Pain Temperature: No Abnormality Moisture No Abnormalities Noted: No Dry / Scaly: Yes Treatment Notes Wound #4 (Foot) Wound Laterality: Right, Lateral, Distal Cleanser Soap and Water Discharge Instruction: May shower and wash wound with dial antibacterial soap and water prior to dressing change. Wound Cleanser Discharge Instruction: Cleanse the wound with wound cleanser prior to applying a clean dressing using gauze sponges, not tissue or cotton balls. Peri-Wound Care Topical Primary Dressing Maxorb Extra Ag+ Alginate Dressing, 2x2 (in/in) Discharge Instruction: Apply to wound bed as instructed Secondary Dressing ADAPTIC TOUCH 3x4.25 in Discharge Instruction: Apply over primary dressing as directed. Woven Gauze Sponges 2x2 in Discharge Instruction: moistened with saline. Apply over primary dressing as directed. Secured With Paper Tape, 2x10 (in/yd) Discharge Instruction: Secure dressing with tape as  directed. Compression Wrap Kerlix Roll 4.5x3.1 (in/yd) Discharge Instruction: Apply Kerlix and Coban compression as directed. Compression Stockings Add-Ons Electronic Signature(s) Signed: 05/06/2022 3:54:21 PM By: [name] Entered By: [name] on 05/06/2022 10:25:08 -------------------------------------------------------------------------------- Vitals Details Patient Name: Date of Service: [name] 05/06/2022 10:00 [name] Medical Record Number: [id number] Patient Account Number: [id number] Date of Birth/Sex: Treating RN: January 21, 1970 (52 y.o. [name] Primary Care Draco Malczewski: Tana Conch Other Clinician: Referring Renleigh Ouellet: Treating Zaide Mcclenahan/Extender: Arvin Collard in Treatment: 20 Vital Signs Time Taken: 09:58 Temperature (F): 98.2 Height (in): 72 Pulse (bpm): 73 Weight (lbs): 312 Respiratory Rate (breaths/min): 16 Body Mass Index (BMI): 42.3 Blood Pressure (mmHg): 160/84 Reference Range: 80 - 120 mg / dl Electronic Signature(s) [name],  [name] ([id number]) [id number] Page 9 of 9 Signed: 05/06/2022 3:54:21 PM By: [name] Entered By: [name] on 05/06/2022 09:58:49

## 2022-05-08 ENCOUNTER — Ambulatory Visit: Payer: BLUE CROSS/BLUE SHIELD | Admitting: Podiatry

## 2022-05-08 DIAGNOSIS — L97522 Non-pressure chronic ulcer of other part of left foot with fat layer exposed: Secondary | ICD-10-CM | POA: Diagnosis not present

## 2022-05-08 DIAGNOSIS — S90422A Blister (nonthermal), left great toe, initial encounter: Secondary | ICD-10-CM

## 2022-05-08 NOTE — Progress Notes (Signed)
Subjective:  Patient ID: [name], male    DOB: [birth year],  MRN: [id number]  Chief Complaint  Patient presents with   Foot Ulcer    Left foot callus/ulcer     52 y.o. male presents with the above complaint.  Patient presents with follow-up of left submetatarsal 1 ulceration.  Patient being treated for the right side with total contact cast at the wound care center.  I encouraged him to let Dr. Doylene Canard know about the left side as well   Review of Systems: Negative except as noted in the HPI. Denies N/V/F/Ch.  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   (06-17-2019 pt stated has been stable with no changes and followed by pcp)   Tibial artery occlusion    11-06-2016  lower extremity angiography (dr Edilia Bo)  mil tibial disease w/ occlusion of the distal peroneal artery and dorsalis pedis artery but has widely patent posterior tibial and anterior tibial arterties   Type 2 diabetes mellitus treated with insulin    followed by pcp   (06-17-2019  pt stated checks blood sugar daily in am,  fasting sugar-- average 71)   Vitamin B 12 deficiency     Current Outpatient Medications:    ascorbic acid (VITAMIN C) 500 MG tablet, Take 500 mg by mouth daily., Disp: , Rfl:    aspirin EC 81 MG tablet, Take 81 mg by mouth daily., Disp: , Rfl:    atorvastatin (LIPITOR) 80 MG tablet, Take 1 tablet (80 mg total) by mouth daily., Disp: 90 tablet, Rfl: 3    Cyanocobalamin (VITAMIN B-12) 5000 MCG SUBL, Place 5,000 mcg under the tongue daily., Disp: , Rfl:    fenofibrate (TRICOR) 48 MG tablet, TAKE 1 TABLET BY MOUTH EVERY DAY (Patient taking differently: Take 48 mg by mouth daily.), Disp: 90 tablet, Rfl: 4   glucose blood (ACCU-CHEK GUIDE) test strip, Dx E11.49 - Use to check blood glucose three times daily or as directed., Disp: 300 each, Rfl: 1   hydrochlorothiazide (MICROZIDE) 12.5 MG capsule, TAKE 1 CAPSULE BY MOUTH EVERY DAY IN THE MORNING (Patient taking differently: Take 12.5 mg by mouth daily. TAKE 1 CAPSULE BY MOUTH EVERY DAY IN THE MORNING), Disp: 100 capsule, Rfl: 3   insulin aspart (NOVOLOG) 100 UNIT/ML FlexPen, Inject 15-25 Units into the skin 3 (three) times daily with meals. Please provide pen needles if possible (Patient taking differently: Inject 15-25 Units into the skin 3 (three) times daily with meals. Per sliding scale), Disp: 45 mL, Rfl: 3   insulin degludec (TRESIBA FLEXTOUCH) 200 UNIT/ML FlexTouch Pen, Inject 80-100 Units into the skin daily., Disp: 6 mL, Rfl: 5   Insulin Pen Needle 31G X 8 MM MISC, Use to inject insulin daily. Dx: E11.9, Disp: 90 each, Rfl: 11   Insulin Syringe-Needle U-100 (BD INSULIN SYRINGE U/F) 31G X 5/16" 0.5 ML MISC, Use to inject insulin  4 times daily. DX:E11.9, Disp: 100 each, Rfl: 4   Insulin Syringe-Needle U-100 (BD VEO INSULIN SYR U/F 1/2UNIT) 31G X 15/64" 0.3 ML MISC, USE TO INJECT INSULIN FOUR TIMES DAILY AS DIRECTED, Disp: 300 each, Rfl: 2   JANUVIA 50 MG tablet, TAKE 1 TABLET BY MOUTH EVERY DAY IN THE MORNING (Patient taking differently: Take 50 mg by mouth daily.), Disp: 90 tablet, Rfl: 1   Lancets (ONETOUCH ULTRASOFT) lancets, Use to test blood sugars daily. Dx: E11.9, Disp: 100 each, Rfl: 12   Lancets MISC, USED TO CHECK BLOOD GLUCOSE 3-4 TIMES DAILY, Disp: 200 each, Rfl: 3   Multiple Vitamin (MULTIVITAMIN WITH MINERALS) TABS tablet, Take 1 tablet by mouth 2 (two) times daily., Disp: , Rfl:     Omega-3 Fatty Acids (FISH OIL) 1360 MG CAPS, Take 1,360 mg by mouth daily. , Disp: , Rfl:    pyridoxine (B-6) 100 MG tablet, Take 100 mg by mouth 2 (two) times daily., Disp: , Rfl:    ramipril (ALTACE) 5 MG capsule, TAKE 1 CAPSULE BY MOUTH EVERY DAY IN THE MORNING (Patient taking differently: Take 5 mg by mouth daily.), Disp: 90 capsule, Rfl: 4  Social History   Tobacco Use  Smoking Status Never  Smokeless Tobacco Never    No Known Allergies Objective:  There were no vitals filed for this visit. There is no height or weight on file to calculate BMI. Constitutional Well developed. Well nourished.  Vascular Dorsalis pedis pulses palpable bilaterally. Posterior tibial pulses palpable bilaterally. Capillary refill normal to all digits.  No cyanosis or clubbing noted. Pedal hair growth normal.  Neurologic Normal speech. Oriented to person, place, and time. Epicritic sensation to light touch grossly present bilaterally.  Dermatologic Left submetatarsal 1 friction blister with underlying wound.  No purulent drainage noted no erythema or redness noted.  No bony abnormalities noted.  No deep ulceration noted.  Orthopedic: Normal joint ROM without pain or crepitus bilaterally. No visible deformities. No bony tenderness.   Radiographs: None Assessment:   1. Blister of left great toe, initial encounter   2. Ulcer of left foot with fat layer exposed     Plan:  Patient was evaluated and treated and all questions answered.  Left submetatarsal 1 friction blister with underlying superficial ulceration limited to the breakdown of the skin -All questions and concerns were discussed with the patient in extensive detail. -I discussed with him that apply surgical shoe to the left side and do Betadine dressings once a day.  He states understanding will do so immediately. -Patient is being seen at the wound care center I will defer further management to the wound care center for the left side as  well.Marland Kitchen  He is currently undergoing hyperbaric oxygen therapy.  No follow-ups on file.

## 2022-05-12 ENCOUNTER — Encounter (HOSPITAL_BASED_OUTPATIENT_CLINIC_OR_DEPARTMENT_OTHER): Payer: BLUE CROSS/BLUE SHIELD | Admitting: General Surgery

## 2022-05-12 DIAGNOSIS — E11621 Type 2 diabetes mellitus with foot ulcer: Secondary | ICD-10-CM | POA: Diagnosis not present

## 2022-05-12 NOTE — Progress Notes (Signed)
[name], [name] ([id number]) [id number] Page 1 of 9 Visit Report for 05/12/2022 Arrival Information Details Patient Name: Date of Service: [name], [name] 05/12/2022 10:00 A M Medical Record Number: [id number] Patient Account Number: [id number] Date of Birth/Sex: Treating RN: 06/16/1970 (52 y.o. [name] Primary Care Leanord Thibeau: Tana Conch Other Clinician: Referring Johnika Escareno: Treating Kaylena Pacifico/Extender: Arvin Collard in Treatment: 72 Visit Information History Since Last Visit Added or deleted any medications: No Patient Arrived: Ambulatory Any new allergies or adverse reactions: No Arrival Time: 09:54 Had a fall or experienced change in No Accompanied By: self activities of daily living that may affect Transfer Assistance: None risk of falls: Patient Identification Verified: Yes Signs or symptoms of abuse/neglect since last visito No Secondary Verification Process Completed: Yes Hospitalized since last visit: No Patient Requires Transmission-Based Precautions: No Implantable device outside of the clinic excluding No Patient Has Alerts: Yes cellular tissue based products placed in the center since last visit: Has Dressing in Place as Prescribed: Yes Has Footwear/Offloading in Place as Prescribed: Yes Right: T Contact Cast otal Pain Present Now: No Electronic Signature(s) Signed: 05/12/2022 4:01:39 PM By: [name] Entered By: [name] on 05/12/2022 09:54:49 -------------------------------------------------------------------------------- Encounter Discharge Information Details Patient Name: Date of Service: [name], [name] 05/12/2022 10:00 A M Medical Record Number: [id number] Patient Account Number: [id number] Date of Birth/Sex: Treating RN: [birth year] [name] Primary Care [name]: Tana Conch Other Clinician: Referring Iverna Hammac: Treating Rody Keadle/Extender:  Arvin Collard in Treatment: 28 Encounter Discharge Information Items Post Procedure Vitals Discharge Condition: Stable Temperature (F): 98.6 Ambulatory Status: Ambulatory Pulse (bpm): 82 Discharge Destination: Home Respiratory Rate (breaths/min): 16 Transportation: Private Auto Blood Pressure (mmHg): 151/80 Accompanied By: self Schedule Follow-up Appointment: Yes Clinical Summary of Care: Patient Declined Electronic Signature(s) Signed: 05/12/2022 4:01:39 PM By: [name] Entered By: [name] on 05/12/2022 10:29:13 -------------------------------------------------------------------------------- Lower Extremity Assessment Details Patient Name: Date of Service: [name], [name] 05/12/2022 10:00 A M Medical Record Number: [id number] Patient Account Number: [id number] Date of Birth/Sex: Treating RN: 25-Sep-1970 (52 y.o. [name] Primary Care [name]: Tana Conch Other Clinician: Referring [name]: Treating Brytnee Bechler/Extender: Arvin Collard in Treatment: 44 Edema Assessment Left: [Left: Right] Franne Forts: :] Assessed: [Left: No] [Right: No] Edema: [Left: N] [Right: o] Calf Left: Right: Point of Measurement: From Medial Instep 39.5 cm Ankle Left: Right: Point of Measurement: From Medial Instep 24 cm Vascular Assessment Pulses: Dorsalis Pedis Palpable: [Right:Yes] Electronic Signature(s) Signed: 05/12/2022 4:01:39 PM By: [name] Entered By: [name] on 05/12/2022 10:01:35 -------------------------------------------------------------------------------- Multi Wound Chart Details Patient Name: Date of Service: [name], [name] 05/12/2022 10:00 A M Medical Record Number: [id number] Patient Account Number: [id number] Date of Birth/Sex: Treating RN: Sep 28, 1970 (52 y.o. M) Primary Care [name]: Tana Conch Other Clinician: Referring [name]: Treating Nicolo Tomko/Extender:  Arvin Collard in Treatment: 44 Vital Signs Height(in): 72 Pulse(bpm): 82 Weight(lbs): 312 Blood Pressure(mmHg): 151/80 Body Mass Index(BMI): 42.3 Temperature(F): 98.6 Respiratory Rate(breaths/min): 16 [2:Photos:] [N/A:N/A] Right, Lateral, Plantar Foot Right, Distal, Lateral Foot N/A Wound Location: Gradually Appeared Blister N/A Wounding Event: Diabetic Wound/Ulcer of the Lower Diabetic Wound/Ulcer of the Lower N/A Primary Etiology: Extremity Extremity Type II Diabetes Type II Diabetes N/A Comorbid History: 12/19/2016 05/06/2022 N/A Date Acquired: 44 0 N/A Weeks of Treatment: Open Open N/A Wound Status: No No N/A Wound Recurrence: 0.2x0.2x0.2 1x1.2x0.1 N/A Measurements L x W x D (cm) 0.031 0.942 N/A A (  cm) : rea 0.006 0.094 N/A Volume (cm) : 99.10% 40.00% N/A % Reduction in A rea: 100.00% 40.10% N/A % Reduction in Volume: Grade 3 Grade 1 N/A Classification: Medium Medium N/A Exudate A mount: Serosanguineous Purulent N/A Exudate Type: red, brown yellow, brown, green N/A Exudate Color: Thickened Distinct, outline attached N/A Wound Margin: Large (67-100%) Large (67-100%) N/A Granulation A mount: Red Red N/A Granulation Quality: None Present (0%) Small (1-33%) N/A Necrotic A mount: Fat Layer (Subcutaneous Tissue): Yes Fat Layer (Subcutaneous Tissue): Yes N/A Exposed Structures: [name], Macintyre M ([id number]) [id number] Page 3 of 9 Fascia: No Fascia: No Tendon: No Tendon: No Muscle: No Muscle: No Joint: No Joint: No Bone: No Bone: No Large (67-100%) Small (1-33%) N/A Epithelialization: N/A Debridement - Selective/Open Wound N/A Debridement: Pre-procedure Verification/Time Out N/A 10:12 N/A Taken: N/A Lidocaine 4% Topical Solution N/A Pain Control: N/A Slough N/A Tissue Debrided: N/A Skin/Epidermis N/A Level: N/A 0.94 N/A Debridement A (sq cm): rea N/A Curette N/A Instrument: N/A  Minimum N/A Bleeding: N/A Pressure N/A Hemostasis A chieved: N/A Procedure was tolerated well N/A Debridement Treatment Response: N/A 1x1.2x0.1 N/A Post Debridement Measurements L x W x D (cm) N/A 0.094 N/A Post Debridement Volume: (cm) Callus: Yes Callus: Yes N/A Periwound Skin Texture: Maceration: Yes Dry/Scaly: Yes N/A Periwound Skin Moisture: No Abnormalities Noted No Abnormalities Noted N/A Periwound Skin Color: No Abnormality No Abnormality N/A Temperature: T Contact Cast otal Debridement N/A Procedures Performed: Treatment Notes Wound #2 (Foot) Wound Laterality: Plantar, Right, Lateral Cleanser Soap and Water Discharge Instruction: May shower and wash wound with dial antibacterial soap and water prior to dressing change. Wound Cleanser Discharge Instruction: Cleanse the wound with wound cleanser prior to applying a clean dressing using gauze sponges, not tissue or cotton balls. Peri-Wound Care Topical Gentamicin Discharge Instruction: As directed by physician Primary Dressing Maxorb Extra Ag+ Alginate Dressing, 2x2 (in/in) Discharge Instruction: Apply to wound bed as instructed Secondary Dressing Woven Gauze Sponge, Non-Sterile 4x4 in Discharge Instruction: Apply over primary dressing as directed. Zetuvit Plus 4x8 in Discharge Instruction: Apply over primary dressing as directed. Secured With Paper Tape, 2x10 (in/yd) Discharge Instruction: Secure dressing with tape as directed. Compression Wrap Kerlix Roll 4.5x3.1 (in/yd) Discharge Instruction: Apply Kerlix and Coban compression as directed. Compression Stockings Add-Ons Wound #4 (Foot) Wound Laterality: Right, Lateral, Distal Cleanser Soap and Water Discharge Instruction: May shower and wash wound with dial antibacterial soap and water prior to dressing change. Wound Cleanser Discharge Instruction: Cleanse the wound with wound cleanser prior to applying a clean dressing using gauze sponges, not tissue  or cotton balls. Peri-Wound Care Topical Gentamicin Discharge Instruction: As directed by physician [name], Tayron M ([id number]) [id number] Page 4 of 9 Primary Dressing Maxorb Extra Ag+ Alginate Dressing, 2x2 (in/in) Discharge Instruction: Apply to wound bed as instructed Secondary Dressing Woven Gauze Sponge, Non-Sterile 4x4 in Discharge Instruction: Apply over primary dressing as directed. Zetuvit Plus 4x8 in Discharge Instruction: Apply over primary dressing as directed. Secured With Paper Tape, 2x10 (in/yd) Discharge Instruction: Secure dressing with tape as directed. Compression Wrap Kerlix Roll 4.5x3.1 (in/yd) Discharge Instruction: Apply Kerlix and Coban compression as directed. Compression Stockings Add-Ons Electronic Signature(s) Signed: 05/12/2022 11:02:30 AM By: Duanne Guess MD FACS Entered By: Duanne Guess on 05/12/2022 11:02:30 -------------------------------------------------------------------------------- Multi-Disciplinary Care Plan Details Patient Name: Date of Service: [name], [name] 05/12/2022 10:00 A M Medical Record Number: [id number] Patient Account Number: [id number] Date of Birth/Sex: Treating RN: May 02, 1970 (52 y.o. [name] Primary Care Cederic Mozley: Durene Cal,  Jeannett Senior Other Clinician: Referring Bryce Kimble: Treating Marielys Trinidad/Extender: Arvin Collard in Treatment: 29 Multidisciplinary Care Plan reviewed with physician Active Inactive Nutrition Nursing Diagnoses: Impaired glucose control: actual or potential Potential for alteratiion in Nutrition/Potential for imbalanced nutrition Goals: Patient/caregiver will maintain therapeutic glucose control Date Initiated: 07/29/2021 Target Resolution Date: 05/23/2022 Goal Status: Active Interventions: Assess patient nutrition upon admission and as needed per policy Provide education on elevated blood sugars and impact on wound healing Treatment  Activities: Patient referred to Primary Care Physician for further nutritional evaluation : 07/29/2021 Notes: Wound/Skin Impairment Nursing Diagnoses: Impaired tissue integrity Goals: Patient/caregiver will verbalize understanding of skin care regimen Date Initiated: 07/04/2021 Target Resolution Date: 05/23/2022 Goal Status: [address] [name], Raymie M ([id number]) [id number] Page 5 of 9 Interventions: Assess ulceration(s) every visit Treatment Activities: Skin care regimen initiated : 07/04/2021 Notes: Electronic Signature(s) Signed: 05/12/2022 4:01:39 PM By: [name] Entered By: [name] on 05/12/2022 10:27:44 -------------------------------------------------------------------------------- Pain Assessment Details Patient Name: Date of Service: [name], [name] 05/12/2022 10:00 A M Medical Record Number: [id number] Patient Account Number: [id number] Date of Birth/Sex: Treating RN: July 09, 1970 (52 y.o. [name] Primary Care Ladora Osterberg: Tana Conch Other Clinician: Referring Verland Sprinkle: Treating Marirose Deveney/Extender: Arvin Collard in Treatment: 52 Active Problems Location of Pain Severity and Description of Pain Patient Has Paino No Site Locations Rate the pain. Current Pain Level: 0 Pain Management and Medication Current Pain Management: Electronic Signature(s) Signed: 05/12/2022 4:01:39 PM By: [name] Entered By: [name] on 05/12/2022 09:55:18 -------------------------------------------------------------------------------- Patient/Caregiver Education Details Patient Name: Date of Service: [name], [name] 4/22/2024andnbsp10:00 A M Medical Record Number: [id number] Patient Account Number: [id number] Date of Birth/Gender: Treating RN: 11-20-1970 (52 y.o. [name] Primary Care Physician: Tana Conch Other Clinician: Referring Physician: Treating  Physician/Extender: Arvin Collard in Treatment: 56 Education Assessment Education Provided To: Patient [name], Traylon M ([id number]) [id number] Page 6 of 9 Education Topics Provided Offloading: Methods: Explain/Verbal Responses: Reinforcements needed, State content correctly Electronic Signature(s) Signed: 05/12/2022 4:01:39 PM By: [name] Entered By: [name] on 05/12/2022 10:27:59 -------------------------------------------------------------------------------- Wound Assessment Details Patient Name: Date of Service: [name], [name] 05/12/2022 10:00 A M Medical Record Number: [id number] Patient Account Number: [id number] Date of Birth/Sex: Treating RN: 1970-03-27 (52 y.o. [name] Primary Care Sabriyah Wilcher: Tana Conch Other Clinician: Referring Oneka Parada: Treating Kikuye Korenek/Extender: Arvin Collard in Treatment: 44 Wound Status Wound Number: 2 Primary Etiology: Diabetic Wound/Ulcer of the Lower Extremity Wound Location: Right, Lateral, Plantar Foot Wound Status: Open Wounding Event: Gradually Appeared Comorbid History: Type II Diabetes Date Acquired: 12/19/2016 Weeks Of Treatment: 44 Clustered Wound: No Photos Wound Measurements Length: (cm) 0.2 Width: (cm) 0.2 Depth: (cm) 0.2 Area: (cm) 0.031 Volume: (cm) 0.006 % Reduction in Area: 99.1% % Reduction in Volume: 100% Epithelialization: Large (67-100%) Tunneling: No Undermining: No Wound Description Classification: Grade 3 Wound Margin: Thickened Exudate Amount: Medium Exudate Type: Serosanguineous Exudate Color: red, brown Foul Odor After Cleansing: No Slough/Fibrino No Wound Bed Granulation Amount: Large (67-100%) Exposed Structure Granulation Quality: Red Fascia Exposed: No Necrotic Amount: None Present (0%) Fat Layer (Subcutaneous Tissue) Exposed: Yes Tendon Exposed: No Muscle Exposed: No Joint  Exposed: No Bone Exposed: No Periwound Skin Texture Texture Color No Abnormalities Noted: No No Abnormalities Noted: Yes Callus: Yes Temperature / Pain Temperature: No [address] [name], Nat M ([id number]) [id number] Page 7 of 9 No Abnormalities Noted: No Maceration: Yes Treatment Notes Wound #2 (Foot) Wound Laterality:  Plantar, Right, Lateral Cleanser Soap and Water Discharge Instruction: May shower and wash wound with dial antibacterial soap and water prior to dressing change. Wound Cleanser Discharge Instruction: Cleanse the wound with wound cleanser prior to applying a clean dressing using gauze sponges, not tissue or cotton balls. Peri-Wound Care Topical Gentamicin Discharge Instruction: As directed by physician Primary Dressing Maxorb Extra Ag+ Alginate Dressing, 2x2 (in/in) Discharge Instruction: Apply to wound bed as instructed Secondary Dressing Woven Gauze Sponge, Non-Sterile 4x4 in Discharge Instruction: Apply over primary dressing as directed. Zetuvit Plus 4x8 in Discharge Instruction: Apply over primary dressing as directed. Secured With Paper Tape, 2x10 (in/yd) Discharge Instruction: Secure dressing with tape as directed. Compression Wrap Kerlix Roll 4.5x3.1 (in/yd) Discharge Instruction: Apply Kerlix and Coban compression as directed. Compression Stockings Add-Ons Electronic Signature(s) Signed: 05/12/2022 4:01:39 PM By: [name] Entered By: [name] on 05/12/2022 10:08:14 -------------------------------------------------------------------------------- Wound Assessment Details Patient Name: Date of Service: [name] 05/12/2022 10:00 A M Medical Record Number: [id number] Patient Account Number: [id number] Date of Birth/Sex: Treating RN: 1970/05/09 (52 y.o. [name] Primary Care Keylon Labelle: Tana Conch Other Clinician: Referring Kharlie Bring: Treating Torey Regan/Extender: Arvin Collard in Treatment: 44 Wound Status Wound Number: 4 Primary Etiology: Diabetic Wound/Ulcer of the Lower Extremity Wound Location: Right, Distal, Lateral Foot Wound Status: Open Wounding Event: Blister Comorbid History: Type II Diabetes Date Acquired: 05/06/2022 Weeks Of Treatment: 0 Clustered Wound: No Photos [name], Marik M ([id number]) [id number] Page 8 of 9 Wound Measurements Length: (cm) 1 Width: (cm) 1.2 Depth: (cm) 0.1 Area: (cm) 0.942 Volume: (cm) 0.094 % Reduction in Area: 40% % Reduction in Volume: 40.1% Epithelialization: Small (1-33%) Tunneling: No Undermining: No Wound Description Classification: Grade 1 Wound Margin: Distinct, outline attached Exudate Amount: Medium Exudate Type: Purulent Exudate Color: yellow, brown, green Foul Odor After Cleansing: No Slough/Fibrino Yes Wound Bed Granulation Amount: Large (67-100%) Exposed Structure Granulation Quality: Red Fascia Exposed: No Necrotic Amount: Small (1-33%) Fat Layer (Subcutaneous Tissue) Exposed: Yes Necrotic Quality: Adherent Slough Tendon Exposed: No Muscle Exposed: No Joint Exposed: No Bone Exposed: No Periwound Skin Texture Texture Color No Abnormalities Noted: No No Abnormalities Noted: Yes Callus: Yes Temperature / Pain Temperature: No Abnormality Moisture No Abnormalities Noted: No Dry / Scaly: Yes Treatment Notes Wound #4 (Foot) Wound Laterality: Right, Lateral, Distal Cleanser Soap and Water Discharge Instruction: May shower and wash wound with dial antibacterial soap and water prior to dressing change. Wound Cleanser Discharge Instruction: Cleanse the wound with wound cleanser prior to applying a clean dressing using gauze sponges, not tissue or cotton balls. Peri-Wound Care Topical Gentamicin Discharge Instruction: As directed by physician Primary Dressing Maxorb Extra Ag+ Alginate Dressing, 2x2 (in/in) Discharge  Instruction: Apply to wound bed as instructed Secondary Dressing Woven Gauze Sponge, Non-Sterile 4x4 in Discharge Instruction: Apply over primary dressing as directed. Zetuvit Plus 4x8 in Discharge Instruction: Apply over primary dressing as directed. Secured With Paper Tape, 2x10 (in/yd) Discharge Instruction: Secure dressing with tape as directed. [name], Rushawn Paxico ([id number]) [id number] Page 9 of 9 Compression Wrap Kerlix Roll 4.5x3.1 (in/yd) Discharge Instruction: Apply Kerlix and Coban compression as directed. Compression Stockings Add-Ons Electronic Signature(s) Signed: 05/12/2022 4:01:39 PM By: [name] Entered By: [name] on 05/12/2022 10:08:40 -------------------------------------------------------------------------------- Vitals Details Patient Name: Date of Service: [name], [name] 05/12/2022 10:00 A M Medical Record Number: [id number] Patient Account Number: [id number] Date of Birth/Sex: Treating RN: Nov 02, 1970 (52 y.o. [name] Primary Care Whitt Auletta: Tana Conch Other  Clinician: Referring Milas Schappell: Treating Dametria Tuzzolino/Extender: Arvin Collard in Treatment: 44 Vital Signs Time Taken: 09:55 Temperature (F): 98.6 Height (in): 72 Pulse (bpm): 82 Weight (lbs): 312 Respiratory Rate (breaths/min): 16 Body Mass Index (BMI): 42.3 Blood Pressure (mmHg): 151/80 Reference Range: 80 - 120 mg / dl Electronic Signature(s) Signed: 05/12/2022 4:01:39 PM By: [name] Entered By: [name] on 05/12/2022 09:56:02

## 2022-05-12 NOTE — Progress Notes (Signed)
[name], [name] ([id number]) [id number] Page 1 of 11 Visit Report for 05/12/2022 Chief Complaint Document Details Patient Name: Date of Service: [name], [name] 05/12/2022 10:00 [name] Medical Record Number: [id number] Patient Account Number: [id number] Date of Birth/Sex: Treating RN: March 21, 1970 (52 y.o. [name] Primary Care Provider: Tana Conch Other Clinician: Referring Provider: Treating Provider/Extender: Arvin Collard in Treatment: 44 Information Obtained from: Patient Chief Complaint Patients presents for treatment of an open diabetic ulcer Electronic Signature(s) Signed: 05/12/2022 11:02:45 AM By: Duanne Guess MD FACS Entered By: Duanne Guess on 05/12/2022 11:02:45 -------------------------------------------------------------------------------- Debridement Details Patient Name: Date of Service: [name], [name] 05/12/2022 10:00 [name] Medical Record Number: [id number] Patient Account Number: [id number] Date of Birth/Sex: Treating RN: [birth year] (52 y.o. [name] Primary Care Provider: Tana Conch Other Clinician: Referring Provider: Treating Provider/Extender: Arvin Collard in Treatment: 44 Debridement Performed for Assessment: Wound #4 Right,Distal,Lateral Foot Performed By: Physician Duanne Guess, MD Debridement Type: Debridement Severity of Tissue Pre Debridement: Fat layer exposed Level of Consciousness (Pre-procedure): Awake and Alert Pre-procedure Verification/Time Out Yes - 10:12 Taken: Start Time: 10:12 Pain Control: Lidocaine 4% T opical Solution Percent of Wound Bed Debrided: 100% T Area Debrided (cm): otal 0.94 Tissue and other material debrided: Non-Viable, Slough, Skin: Epidermis, Slough Level: Skin/Epidermis Debridement Description: Selective/Open Wound Instrument: Curette Bleeding: Minimum Hemostasis Achieved: Pressure Response to Treatment:  Procedure was tolerated well Level of Consciousness (Post- Awake and Alert procedure): Post Debridement Measurements of Total Wound Length: (cm) 1 Width: (cm) 1.2 Depth: (cm) 0.1 Volume: (cm) 0.094 Character of Wound/Ulcer Post Debridement: Improved Severity of Tissue Post Debridement: Fat layer exposed Post Procedure Diagnosis Same as Pre-procedure Notes scribed for Dr. Lady Gary by [name], RN Electronic Signature(s) Signed: 05/12/2022 12:48:34 PM By: Duanne Guess MD FACS Signed: 05/12/2022 4:01:39 PM By: Herrington, Taylor [name], [name] ([id number]) [id number] Page 2 of 11 Entered By: [name] on 05/12/2022 10:14:11 -------------------------------------------------------------------------------- HPI Details Patient Name: Date of Service: [name], [name] 05/12/2022 10:00 [name] Medical Record Number: [id number] Patient Account Number: [id number] Date of Birth/Sex: Treating RN: 1970-06-01 (52 y.o. 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 The most recent amputation was in August 2022. At that operation, antibiotic beads were placed in the wound. He has been managed by podiatry for his procedures and management of his wounds. He has been in a Scient[facility], water quality. He is on oral doxycycline. They have been using Betadine wet to dry dressings along with Iodosorb. The patient states that when he thinks the wound is getting too dry, he applies topical Neosporin. At his last visit, on June 7 of this year, the podiatrist determined that he felt the wound was stalled and referred  him to wound care for additional evaluation and management. An MRI has been ordered, but not yet scheduled or performed. Pathology from his operation in August 2022 demonstrated findings consistent with chronic osteomyelitis. Today, there is a large irregular wound on the plantar surface of his right foot, at about the level of the fifth metatarsal head. This tracks through to a pinpoint opening on the dorsal lateral portion of his foot. The intake nurse  reported purulent drainage. There is some malodor from the wound. No frank necrosis identified. 07/11/2021: Today, the wounds do not connect. I attempted multiple times from various directions and the shared tunnel is no longer open. He has some slough accumulation on the dorsal part of his foot as well as slough and callus buildup on the plantar surface. His MRI is scheduled for June 29. No purulent drainage or malodor appreciated today. 07/19/2021: The lateral foot wound has closed and there is no tunnel connecting the plantar foot wound to that site. The plantar foot wound still probes quite deeply, however. There is some slough, eschar, and nonviable tissue accumulated in the wound bed. No malodorous drainage present. His MRI was performed yesterday and is consistent with fairly extensive osteomyelitis. 07/29/2021: The patient has an appointment with infectious disease on July 18 to treat his osteomyelitis. The plantar wound still probes quite deeply, approaching bone. The orifice has narrowed quite substantially, however, making it more difficult for him to pack. 08/05/2021: The tunnel connecting the lateral foot wound to the plantar foot wound has reopened. He sees infectious disease tomorrow to discuss long-term antibiotic treatment for osteomyelitis. There was a bit of murky drainage in the wound, but this was noted after he had had topical lidocaine applied so may have just been a blob of the anesthetic. No odor or frank purulent drainage. The  wound probes deeply at the midfoot approaching bone. He does have MRI results confirming his diagnosis of osteomyelitis. He has failed to progress with conventional treatment and I think his best chance for preservation of the foot is to initiate hyperbaric oxygen therapy. 08/13/2021: The tunnel connecting the 2 wounds has closed again. He has a PICC line and is getting IV daptomycin and cefepime. EKG and chest x-ray are within normal limits. He is scheduled to start hyperbaric oxygen therapy tomorrow. The wound in his midfoot probes deeply, approaching bone. The skin at the orifice continues to heaped up and threatens to close over despite the large cavity within. No erythema, induration, or purulent drainage. The wound on his lateral foot is fairly small and quite clean. 08/19/2021: The lateral foot wound has nearly closed. The wound in his midfoot does not probe quite as deeply today. The skin at the orifice continues to try to roll in and obscure the opening. No frankly necrotic tissue appreciated. He is tolerating hyperbaric oxygen therapy. 08/26/2021: The lateral foot wound has closed completely. The wound in his midfoot is shallower again today. The wound orifice is contracting. We are using gentamicin and silver alginate. He continues on IV daptomycin and cefepime and is tolerating hyperbaric oxygen therapy without difficulty. 09/02/2021: His foot is a little bit sore today but he was up walking on it all weekend doing back to school shopping with his daughter. The tunneling is down to about 3 cm and I do not appreciate bone at the tip of the probe. The wound is clean but has some callus creating an overhanging lip at the distal aspect. He continues to receive hyperbaric oxygen therapy as well as IV daptomycin and cefepime. 09/09/2021: His wound continues to improve. The tunnel has come in by 0.9 cm. He continues to form callus around the orifice of the wound. He is tolerating hyperbaric oxygen  therapy along with his IV antibiotics. 09/16/2021: The tunneling is down to just half a centimeter. He continues to build up callus around the orifice of the wound. He completed his course of IV antibiotics and his PICC  line is scheduled to be removed this Friday. He is tolerating hyperbaric oxygen without difficulty. We have been applying topical gentamicin and silver alginate to his wound. 09/24/2021: The wound depth has come in even further. There is a little bit of callus buildup around the orifice. The opening is quite narrow, at this point. 09/30/2021: The wound depth is about the same this week. The opening continues to contract with callus accumulation. There is some discoloration of the skin on the lateral part of his foot and he admitted to walking more than usual this weekend and also wearing a different pair shoes. He continues to tolerate hyperbaric oxygen therapy without difficulty. 10/07/2021: The wound depth has contracted a bit and is now approximately 1 to 1.5 cm. The orifice of the wound continues to try and close in over the space. There is just some callus and skin around the margins obscuring the orifice. 10/14/2021: The wound depth has come in a little bit more. The orifice of the wound is rolling inwards with epithelium and callus, making it difficult to pack the wound. 10/21/2021: There is still 1 portion of the wound, at the lateral aspect, that is still a couple of centimeters deep. The architecture of the wound makes this somewhat challenging to access and pack. Everything else looks clean. He has his usual accumulation of callus and skin, narrowing the orifice. 10/29/2021: No significant change to the depth of the wound. The orifice continues to contract with callus and skin narrowing the opening. His wife has been packing the wound and doing an excellent job. [name], Howie [place] ([id number]) [id number] Page 3 of 11 11/05/2021: The depth of the wound has  come in by about a centimeter. There is less callus accumulation around the orificedespite this, the wound is narrower today. We are still awaiting insurance approval for Kerecis powder. 11/12/2021: The depth of the wound continues to contract. He has accumulated some callus around the orifice, as usual. We have received a verbal confirmation that he is approved for Kerecis, but no formal paperwork has yet been received. 11/19/2021: The depth continues to come in. There is callus accumulation around the orifice, as usual. He has been formally approved for Kerecis, but unfortunately we do not have a billing mechanism for the powder in [employer] yet. We are working to address this so we can begin treatment. 11/25/2021: Continued filling in of the depth of the wound. Continued buildup of callus around the orifice. No concern for infection. As we do not have a way to bill for Kerecis powder, but are capable of billing for the [facility] sheet, we are going to use that on him instead. 12/03/2021: The depth of the wound continues to fill in. As usual, he has accumulated callus and skin around the orifice of the wound. No concern for infection. He will complete his hyperbaric oxygen therapy this week. 12/17/2021: The wound depth continues to contract. There is callus and skin accumulation around the orifice of the wound, as per usual. There is more epithelium on the actual wound surface. Unfortunately, his Darlen Round was not ordered. 12/24/2021: The wound depth has come in a little bit more this week. There is less skin accumulation around the orifice, but still some callus. On questioning, he admits to spending a fair amount of time on his feet. 12/12; this is a very difficult wound to assess size slitlike wound with some depth but minimal opening. I applied Kerecis No. 4 12/18; Kerecis No. 5 01/15/2022: His wound measured deeper  today. There is also some evidence of pressure induced tissue injury around the lateral  aspect of his foot. The patient reports that he was wearing different shoes than usual and felt like his foot was rolling a lot over the weekend. There is heavy callus around the orifice of the wound. No concern for infection. 01/21/2022: The depth decreased today from 1.7 to 1.0 cm. Significantly less tissue damage as compared to last week. Still with callus and skin accumulation around the orifice. 01/29/2022: The wound continues to contract and the depth is coming in further. We are still working on getting [employer] for him. 02/04/2022: No real change this week. Still working on the [employer]. 02/11/2022: No significant changes again. We are still working on the [employer]. 02/18/2022: No change to the wound. We have submitted in IVR for [employer]. 02/25/2022: Unfortunately, his wound has deteriorated over the past week. It is deeper and is approaching bone once again. There is more slough accumulation and the tissue shows signs of pressure injury. He says that he has been trying to wear his new work boots, which are waterproof. He says they make his feet sweat excessively. I think our main challenge with him is that we just are not able to adequately offload him while he is still working and on his feet through much of the day. He is willing to consider total contact casting, but will need to make some arrangements with work. On a more positive note, however, he has been approved for [employer]. 03/03/2022: The wound probed a little bit deeper again today. We are planning to put him in a total contact cast as soon as he has cleared it with work to take some time off. 03/10/2022: The wound was a little bit shallower today and the overall appearance is significantly improved. We are planning to put him in a cast today. 03/12/2022: He is here for his first total contact cast change. He denies any issues with pressure, rubbing, or pain. 03/17/2022: For some reason, the wound probes deeper today.  There is some slough and nonviable subcutaneous tissue around the opening. He denies any difficulty with the total contact cast. 03/24/2022: The wound is shallower today. There is better granulation tissue visible on the wound surface. 03/31/2022: His cast protector got a hole in it and his cast got wet. His foot is macerated. The wound measured a little deeper today. There is some hypertrophic granulation tissue at the orifice of his wound. 3/18; patient presents for follow-up. He had no issues with the total contact cast. We have been using epi cord to the wound bed. Overall wound is stable. Patient denies signs of infection. 04/14/2022: The depth of the wound has rather remarkably decreased to 1.3 cm today. The orifice of the wound has also contracted quite a bit. 04/21/2022: The depth of the wound has decreased even further. The orifice is also smaller. The wound is very clean. 04/28/2022: The depth of the wound is down to 0.5 cm. The orifice continues to contract. 05/06/2022: The primary wound is substantially smaller and shallower today. Unfortunately, he developed a blister distal to this on the plantar surface of his foot. The tissue breakdown underneath the blister does extend into the fat layer in 1 area over a bony prominence from his collapsed arch. The remainder of the tissue underneath the blister is intact skin. 05/12/2022: The primary wound is down to just a couple of millimeters and is flush with the surrounding skin. The area  that had blistered last week had some purulent drainage coming follow-up a corner that was underneath some dry skin. No malodor associated with this. There is some additional slough that has accumulated. Electronic Signature(s) Signed: 05/12/2022 11:03:45 AM By: Duanne Guess MD FACS Entered By: Duanne Guess on 05/12/2022 11:03:45 -------------------------------------------------------------------------------- Physical Exam Details Patient Name: Date of  Service: [name], [name] 05/12/2022 10:00 [name] Medical Record Number: [id number] Patient Account Number: [id number] [name], Zvi [name] ([id number]) [id number] Page 4 of 11 Date of Birth/Sex: Treating RN: 09/21/1970 (52 y.o. [name] Primary Care Provider: Other Clinician: Tana Conch Referring Provider: Treating Provider/Extender: Arvin Collard in Treatment: 46 Constitutional Hypertensive, asymptomatic. . . . no acute distress. Respiratory Normal work of breathing on room air. Notes 05/12/2022: The primary wound is down to just a couple of millimeters and is flush with the surrounding skin. The area that had blistered last week had some purulent drainage coming follow-up a corner that was underneath some dry skin. No malodor associated with this. There is some additional slough that has accumulated. Electronic Signature(s) Signed: 05/12/2022 11:13:34 AM By: Duanne Guess MD FACS Previous Signature: 05/12/2022 11:05:54 AM Version By: Duanne Guess MD FACS Entered By: Duanne Guess on 05/12/2022 11:13:33 -------------------------------------------------------------------------------- Physician Orders Details Patient Name: Date of Service: [name], [name] 05/12/2022 10:00 [name] Medical Record Number: [id number] Patient Account Number: [id number] Date of Birth/Sex: Treating RN: [birth year] (52 y.o. 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 - Dr Lady Gary RM 2 Return A Anesthetic Wound #2 Right,Lateral,Plantar Foot (In clinic) Topical Lidocaine 4% applied to wound bed Bathing/ Shower/ Hygiene May shower with protection but do not get wound dressing(s) wet. Protect dressing(s) with water repellant cover (for example, large plastic bag) or a cast cover and may then take shower. Off-Loading Total Contact Cast to Right Lower Extremity Other: - Try to not stand on your feet too much, minimize walking Wound Treatment Wound #2 - Foot Wound Laterality: Plantar, Right, Lateral Cleanser: Soap and Water 1 x Per Week/30 Days Discharge Instructions: May shower and wash wound with dial antibacterial soap and water prior to dressing change. Cleanser: Wound Cleanser (Generic) 1 x Per Week/30 Days Discharge Instructions: Cleanse the wound with wound cleanser prior to applying a clean dressing using gauze sponges, not tissue or cotton balls. Topical: Gentamicin 1 x Per Week/30 Days Discharge Instructions: As directed by physician Prim Dressing: Maxorb Extra Ag+ Alginate Dressing, 2x2 (in/in) 1 x Per Week/30 Days ary Discharge Instructions: Apply to wound bed as instructed Secondary Dressing: Woven Gauze Sponge, Non-Sterile 4x4 in 1 x Per Week/30 Days [name], Tramane [name] ([id number]) [id number] Page 5 of 11 Discharge Instructions: Apply over primary dressing as directed. Secondary Dressing: Zetuvit Plus 4x8 in 1 x Per Week/30 Days Discharge Instructions: Apply over primary dressing as directed. Secured With: Paper Tape, 2x10 (in/yd) (Generic) 1 x Per Week/30 Days Discharge Instructions: Secure dressing with tape as directed. Compression Wrap: Kerlix Roll 4.5x3.1 (in/yd) (Generic) 1 x Per Week/30 Days Discharge Instructions: Apply Kerlix and  Coban compression as directed. Wound #4 - Foot Wound Laterality: Right, Lateral, Distal Cleanser:  Soap and Water 1 x Per Week/30 Days Discharge Instructions: May shower and wash wound with dial antibacterial soap and water prior to dressing change. Cleanser: Wound Cleanser (Generic) 1 x Per Week/30 Days Discharge Instructions: Cleanse the wound with wound cleanser prior to applying a clean dressing using gauze sponges, not tissue or cotton balls. Topical: Gentamicin 1 x Per Week/30 Days Discharge Instructions: As directed by physician Prim Dressing: Maxorb Extra Ag+ Alginate Dressing, 2x2 (in/in) 1 x Per Week/30 Days ary Discharge Instructions: Apply to wound bed as instructed Secondary Dressing: Woven Gauze Sponge, Non-Sterile 4x4 in 1 x Per Week/30 Days Discharge Instructions: Apply over primary dressing as directed. Secondary Dressing: Zetuvit Plus 4x8 in 1 x Per Week/30 Days Discharge Instructions: Apply over primary dressing as directed. Secured With: Paper Tape, 2x10 (in/yd) (Generic) 1 x Per Week/30 Days Discharge Instructions: Secure dressing with tape as directed. Compression Wrap: Kerlix Roll 4.5x3.1 (in/yd) (Generic) 1 x Per Week/30 Days Discharge Instructions: Apply Kerlix and Coban compression as directed. Patient Medications llergies: No Known Allergies A Notifications Medication Indication Start End 05/12/2022 lidocaine DOSE topical 4 % cream - cream topical Electronic Signature(s) Signed: 05/12/2022 12:48:34 PM By: Duanne Guess MD FACS Entered By: Duanne Guess on 05/12/2022 11:13:50 -------------------------------------------------------------------------------- Problem List Details Patient Name: Date of Service: [name], [name] 05/12/2022 10:00 [name] Medical Record Number: [id number] Patient Account Number: [id number] Date of Birth/Sex: Treating RN: Jun 25, 1970 (52 y.o. 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 05/12/2022 10:00 [name] Medical Record Number: [id number] Patient Account Number: [id number] Date of Birth/Sex: Treating RN: 07/16/1970 (52 y.o. 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 The most recent amputation was in August 2022. At that operation, antibiotic beads were placed in the wound. He has been managed by podiatry for his procedures and management of his wounds. He has been in a Scientist, water quality. He is on oral doxycycline. They have been using Betadine wet to dry dressings along with Iodosorb. The patient states that when he thinks the wound is getting too dry, he applies  topical Neosporin. At his last visit, on June 7 of this year, the podiatrist determined that he felt the wound was stalled and referred him to wound care for additional evaluation and management. An MRI has been  ordered, but not yet scheduled or performed. Pathology from his operation in August 2022 demonstrated findings consistent with chronic osteomyelitis. Today, there is a large irregular wound on the plantar surface of his right foot, at about the level of the fifth metatarsal head. This tracks through to a pinpoint opening on the dorsal lateral portion of his foot. The intake nurse reported purulent drainage. There is some malodor from the wound. No frank necrosis identified. 07/11/2021: Today, the wounds do not connect. I attempted multiple times from various directions and the shared tunnel is no longer open. He has some slough accumulation on the dorsal part of his foot as well as slough and callus buildup on the plantar surface. His MRI is scheduled for June 29. No purulent drainage or malodor appreciated today. 07/19/2021: The lateral foot wound has closed and there is no tunnel connecting the plantar foot wound to that site. The plantar foot wound still probes quite deeply, however. There is some slough, eschar, and nonviable tissue accumulated in the wound bed. No malodorous drainage present. His MRI was performed yesterday and is consistent with fairly extensive osteomyelitis. 07/29/2021: The patient has an appointment with infectious disease on July 18 to treat his osteomyelitis. The plantar wound still probes quite deeply, approaching bone. The orifice has narrowed quite substantially, however, making it more difficult for him to pack. 08/05/2021: The tunnel connecting the lateral foot wound to the plantar foot wound has reopened. He sees infectious disease tomorrow to discuss long-term antibiotic treatment for osteomyelitis. There was a bit of murky drainage in the wound, but this was  noted after he had had topical lidocaine applied so may have just been a blob of the anesthetic. No odor or frank purulent drainage. The wound probes deeply at the midfoot approaching bone. He does have MRI results confirming his diagnosis of osteomyelitis. He has failed to progress with conventional treatment and I think his best chance for preservation of the foot is to initiate hyperbaric oxygen therapy. 08/13/2021: The tunnel connecting the 2 wounds has closed again. He has a PICC line and is getting IV daptomycin and cefepime. EKG and chest x-ray are within normal limits. He is scheduled to start hyperbaric oxygen therapy tomorrow. The wound in his midfoot probes deeply, approaching bone. The skin at the orifice continues to heaped up and threatens to close over despite the large cavity within. No erythema, induration, or purulent drainage. The wound on his lateral foot is fairly small and quite clean. 08/19/2021: The lateral foot wound has nearly closed. The wound in his midfoot does not probe quite as deeply today. The skin at the orifice continues to try to roll in and obscure the opening. No frankly necrotic tissue appreciated. He is tolerating hyperbaric oxygen therapy. 08/26/2021: The lateral foot wound has closed completely. The wound in his midfoot is shallower again today. The wound orifice is contracting. We are using gentamicin and silver alginate. He continues on IV daptomycin and cefepime and is tolerating hyperbaric oxygen therapy without difficulty. 09/02/2021: His foot is a little bit sore today but he was up walking on it all weekend doing back to school shopping with his daughter. The tunneling is down to about 3 cm and I do not appreciate bone at the tip of the probe. The wound is clean but has some callus creating an overhanging lip at the distal aspect. He continues to receive hyperbaric oxygen therapy as well as IV daptomycin and cefepime. [name], Hans Cokato ([id number])  [id number] Page  7 of 11 09/09/2021: His wound continues to improve. The tunnel has come in by 0.9 cm. He continues to form callus around the orifice of the wound. He is tolerating hyperbaric oxygen therapy along with his IV antibiotics. 09/16/2021: The tunneling is down to just half a centimeter. He continues to build up callus around the orifice of the wound. He completed his course of IV antibiotics and his PICC line is scheduled to be removed this Friday. He is tolerating hyperbaric oxygen without difficulty. We have been applying topical gentamicin and silver alginate to his wound. 09/24/2021: The wound depth has come in even further. There is a little bit of callus buildup around the orifice. The opening is quite narrow, at this point. 09/30/2021: The wound depth is about the same this week. The opening continues to contract with callus accumulation. There is some discoloration of the skin on the lateral part of his foot and he admitted to walking more than usual this weekend and also wearing a different pair shoes. He continues to tolerate hyperbaric oxygen therapy without difficulty. 10/07/2021: The wound depth has contracted a bit and is now approximately 1 to 1.5 cm. The orifice of the wound continues to try and close in over the space. There is just some callus and skin around the margins obscuring the orifice. 10/14/2021: The wound depth has come in a little bit more. The orifice of the wound is rolling inwards with epithelium and callus, making it difficult to pack the wound. 10/21/2021: There is still 1 portion of the wound, at the lateral aspect, that is still a couple of centimeters deep. The architecture of the wound makes this somewhat challenging to access and pack. Everything else looks clean. He has his usual accumulation of callus and skin, narrowing the orifice. 10/29/2021: No significant change to the depth of the wound. The orifice continues to contract  with callus and skin narrowing the opening. His wife has been packing the wound and doing an excellent job. 11/05/2021: The depth of the wound has come in by about a centimeter. There is less callus accumulation around the orificeoodespite this, the wound is narrower today. We are still awaiting insurance approval for Kerecis powder. 11/12/2021: The depth of the wound continues to contract. He has accumulated some callus around the orifice, as usual. We have received a verbal confirmation that he is approved for Kerecis, but no formal paperwork has yet been received. 11/19/2021: The depth continues to come in. There is callus accumulation around the orifice, as usual. He has been formally approved for Kerecis, but unfortunately we do not have a billing mechanism for the powder in [employer] yet. We are working to address this so we can begin treatment. 11/25/2021: Continued filling in of the depth of the wound. Continued buildup of callus around the orifice. No concern for infection. As we do not have a way to bill for Kerecis powder, but are capable of billing for the [facility] sheet, we are going to use that on him instead. 12/03/2021: The depth of the wound continues to fill in. As usual, he has accumulated callus and skin around the orifice of the wound. No concern for infection. He will complete his hyperbaric oxygen therapy this week. 12/17/2021: The wound depth continues to contract. There is callus and skin accumulation around the orifice of the wound, as per usual. There is more epithelium on the actual wound surface. Unfortunately, his Darlen Round was not ordered. 12/24/2021: The wound depth has come in a little  bit more this week. There is less skin accumulation around the orifice, but still some callus. On questioning, he admits to spending a fair amount of time on his feet. 12/12; this is a very difficult wound to assess size slitlike wound with some depth but minimal opening. I applied Kerecis No.  4 12/18; Kerecis No. 5 01/15/2022: His wound measured deeper today. There is also some evidence of pressure induced tissue injury around the lateral aspect of his foot. The patient reports that he was wearing different shoes than usual and felt like his foot was rolling a lot over the weekend. There is heavy callus around the orifice of the wound. No concern for infection. 01/21/2022: The depth decreased today from 1.7 to 1.0 cm. Significantly less tissue damage as compared to last week. Still with callus and skin accumulation around the orifice. 01/29/2022: The wound continues to contract and the depth is coming in further. We are still working on getting [employer] for him. 02/04/2022: No real change this week. Still working on the [employer]. 02/11/2022: No significant changes again. We are still working on the [employer]. 02/18/2022: No change to the wound. We have submitted in IVR for [employer]. 02/25/2022: Unfortunately, his wound has deteriorated over the past week. It is deeper and is approaching bone once again. There is more slough accumulation and the tissue shows signs of pressure injury. He says that he has been trying to wear his new work boots, which are waterproof. He says they make his feet sweat excessively. I think our main challenge with him is that we just are not able to adequately offload him while he is still working and on his feet through much of the day. He is willing to consider total contact casting, but will need to make some arrangements with work. On a more positive note, however, he has been approved for [employer]. 03/03/2022: The wound probed a little bit deeper again today. We are planning to put him in a total contact cast as soon as he has cleared it with work to take some time off. 03/10/2022: The wound was a little bit shallower today and the overall appearance is significantly improved. We are planning to put him in a cast today. 03/12/2022: He is here for  his first total contact cast change. He denies any issues with pressure, rubbing, or pain. 03/17/2022: For some reason, the wound probes deeper today. There is some slough and nonviable subcutaneous tissue around the opening. He denies any difficulty with the total contact cast. 03/24/2022: The wound is shallower today. There is better granulation tissue visible on the wound surface. 03/31/2022: His cast protector got a hole in it and his cast got wet. His foot is macerated. The wound measured a little deeper today. There is some hypertrophic granulation tissue at the orifice of his wound. 3/18; patient presents for follow-up. He had no issues with the total contact cast. We have been using epi cord to the wound bed. Overall wound is stable. Patient denies signs of infection. 04/14/2022: The depth of the wound has rather remarkably decreased to 1.3 cm today. The orifice of the wound has also contracted quite a bit. [name], [name] ([id number]) [id number] Page 8 of 11 04/21/2022: The depth of the wound has decreased even further. The orifice is also smaller. The wound is very clean. 04/28/2022: The depth of the wound is down to 0.5 cm. The orifice continues to contract. 05/06/2022: The primary wound is substantially smaller and shallower  today. Unfortunately, he developed a blister distal to this on the plantar surface of his foot. The tissue breakdown underneath the blister does extend into the fat layer in 1 area over a bony prominence from his collapsed arch. The remainder of the tissue underneath the blister is intact skin. 05/12/2022: The primary wound is down to just a couple of millimeters and is flush with the surrounding skin. The area that had blistered last week had some purulent drainage coming follow-up a corner that was underneath some dry skin. No malodor associated with this. There is some additional slough that has accumulated. Patient History Information obtained  from Patient. Social History Never smoker, Marital Status - Married, Alcohol Use - Never, Drug Use - No History, Caffeine Use - Daily - T coffee. ea; Medical History Endocrine Patient has history of Type II Diabetes Hospitalization/Surgery History - Amuptation of 3rd,4th and 5th toes of Right foot;Oral Surgery;Anal Fissure surgery; Cholecystectomy. Objective Constitutional Hypertensive, asymptomatic. no acute distress. Vitals Time Taken: 9:55 AM, Height: 72 in, Weight: 312 lbs, BMI: 42.3, Temperature: 98.6 F, Pulse: 82 bpm, Respiratory Rate: 16 breaths/min, Blood Pressure: 151/80 mmHg. Respiratory Normal work of breathing on room air. General Notes: 05/12/2022: The primary wound is down to just a couple of millimeters and is flush with the surrounding skin. The area that had blistered last week had some purulent drainage coming follow-up a corner that was underneath some dry skin. No malodor associated with this. There is some additional slough that has accumulated. Integumentary (Hair, Skin) Wound #2 status is Open. Original cause of wound was Gradually Appeared. The date acquired was: 12/19/2016. The wound has been in treatment 44 weeks. The wound is located on the Right,Lateral,Plantar Foot. The wound measures 0.2cm length x 0.2cm width x 0.2cm depth; 0.031cm^2 area and 0.006cm^3 volume. There is Fat Layer (Subcutaneous Tissue) exposed. There is no tunneling or undermining noted. There is a medium amount of serosanguineous drainage noted. The wound margin is thickened. There is large (67-100%) red granulation within the wound bed. There is no necrotic tissue within the wound bed. The periwound skin appearance had no abnormalities noted for color. The periwound skin appearance exhibited: Callus, Maceration. Periwound temperature was noted as No Abnormality. Wound #4 status is Open. Original cause of wound was Blister. The date acquired was: 05/06/2022. The wound is located on the  Right,Distal,Lateral Foot. The wound measures 1cm length x 1.2cm width x 0.1cm depth; 0.942cm^2 area and 0.094cm^3 volume. There is Fat Layer (Subcutaneous Tissue) exposed. There is no tunneling or undermining noted. There is a medium amount of purulent drainage noted. The wound margin is distinct with the outline attached to the wound base. There is large (67-100%) red granulation within the wound bed. There is a small (1-33%) amount of necrotic tissue within the wound bed including Adherent Slough. The periwound skin appearance had no abnormalities noted for color. The periwound skin appearance exhibited: Callus, Dry/Scaly. Periwound temperature was noted as No Abnormality. 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 There was a Selective/Open Wound Skin/Epidermis Debridement  with a total area of 0.94 sq cm performed by Duanne Guess, MD. With the following instrument(s): Curette to remove Non-Viable tissue/material. Material removed includes [facility] and Skin: Epidermis and after achieving pain control using Lidocaine 4% Topical Solution. No specimens were taken. A time out was conducted at 10:12, prior to the start of the procedure. A Minimum amount of bleeding was controlled with Pressure. The procedure was tolerated well. Post Debridement Measurements: 1cm length x 1.2cm width x 0.1cm depth; 0.094cm^3 volume. Character of Wound/Ulcer Post  Debridement is improved. Severity of Tissue Post Debridement is: Fat layer exposed. Post procedure Diagnosis Wound #4: Same as Pre-Procedure General Notes: scribed for Dr. Lady Gary by [name], RN. Wound #2 Pre-procedure diagnosis of Wound #2 is a Diabetic Wound/Ulcer of the Lower Extremity located on the Right,Lateral,Plantar Foot . There was a T Contact otal Cast Procedure by Duanne Guess, MD. Post procedure Diagnosis Wound #2: Same as Pre-Procedure Notes: scribed for Dr. Lady Gary by [name], RN. Plan Follow-up Appointments: Return Appointment in 1 week. - Dr Lady Gary RM 2 Anesthetic: Wound #2 Right,Lateral,Plantar Foot: (In clinic) Topical Lidocaine 4% applied to wound bed Bathing/ Shower/ Hygiene: May shower with protection but do not get wound dressing(s) wet. Protect dressing(s) with water repellant cover (for example, large plastic bag) or a cast cover and may then take shower. Off-Loading: T Contact Cast to Right Lower Extremity otal Other: - Try to not stand on your feet too much, minimize walking The following medication(s) was prescribed: lidocaine topical 4 % cream cream topical was prescribed at facility WOUND #2: - Foot Wound Laterality: Plantar, Right, Lateral Cleanser: Soap and Water 1 x Per Week/30 Days Discharge Instructions: May shower and wash wound with dial antibacterial soap and water prior to dressing change. Cleanser: Wound Cleanser (Generic) 1 x Per Week/30 Days Discharge Instructions: Cleanse the wound with wound cleanser prior to applying a clean dressing using gauze sponges, not tissue or cotton balls. Topical: Gentamicin 1 x Per Week/30 Days Discharge Instructions: As directed by physician Prim Dressing: Maxorb Extra Ag+ Alginate Dressing, 2x2 (in/in) 1 x Per Week/30 Days ary Discharge Instructions: Apply to wound bed as instructed Secondary Dressing: Woven Gauze Sponge, Non-Sterile 4x4 in 1 x Per Week/30 Days Discharge Instructions:  Apply over primary dressing as directed. Secondary Dressing: Zetuvit Plus 4x8 in 1 x Per Week/30 Days Discharge Instructions: Apply over primary dressing as directed. Secured With: Paper T ape, 2x10 (in/yd) (Generic) 1 x Per Week/30 Days Discharge Instructions: Secure dressing with tape as directed. Com pression Wrap: Kerlix Roll 4.5x3.1 (in/yd) (Generic) 1 x Per Week/30 Days Discharge Instructions: Apply Kerlix and Coban compression as directed. WOUND #4: - Foot Wound Laterality: Right, Lateral, Distal Cleanser: Soap and Water 1 x Per Week/30 Days Discharge Instructions: May shower and wash wound with dial antibacterial soap and water prior to dressing change. Cleanser: Wound Cleanser (Generic) 1 x Per Week/30 Days Discharge Instructions: Cleanse the wound with wound cleanser prior to applying a clean dressing using gauze sponges, not tissue or cotton balls. Topical: Gentamicin 1 x Per Week/30 Days Discharge Instructions: As directed by physician Prim Dressing: Maxorb Extra Ag+ Alginate Dressing, 2x2 (in/in) 1 x Per Week/30 Days ary Discharge Instructions: Apply to wound bed as instructed Secondary Dressing: Woven Gauze Sponge, Non-Sterile 4x4 in 1 x Per Week/30 Days Discharge Instructions: Apply over primary dressing as directed. Secondary Dressing: Zetuvit Plus 4x8 in 1 x Per Week/30 Days Discharge Instructions: Apply over primary dressing as directed. Secured With: Paper T  ape, 2x10 (in/yd) (Generic) 1 x Per Week/30 Days Discharge Instructions: Secure dressing with tape as directed. Com pression Wrap: Kerlix Roll 4.5x3.1 (in/yd) (Generic) 1 x Per Week/30 Days Discharge Instructions: Apply Kerlix and Coban compression as directed. 05/12/2022: The primary wound is down to just a couple of millimeters and is flush with the surrounding skin. The area that had blistered last week had some purulent drainage coming follow-up a corner that was underneath some dry skin. No malodor associated with  this. There is some additional slough that has accumulated. I used scissors and forceps to remove additional dry skin from around the more distal wound. I also used a curette to debride some slough from the site. Will apply topical gentamicin and silver alginate here, and silver alginate alone to the longstanding plantar wound. A total contact cast was then applied in standard fashion. Follow-up in 1 week. Electronic Signature(s) Signed: 05/12/2022 11:15:32 AM By: Duanne Guess MD FACS Entered By: Duanne Guess on 05/12/2022 11:15:32 [name], [name] ([id number]) [id number] Page 10 of 11 -------------------------------------------------------------------------------- HxROS Details Patient Name: Date of Service: [name], [name] 05/12/2022 10:00 [name] Medical Record Number: [id number] Patient Account Number: [id number] Date of Birth/Sex: Treating RN: 10-11-1970 (52 y.o. [name] Primary Care Provider: Tana Conch Other Clinician: Referring Provider: Treating Provider/Extender: Arvin Collard in Treatment: 59 Information Obtained From Patient Endocrine Medical History: Positive for: Type II Diabetes Immunizations Pneumococcal Vaccine: Received Pneumococcal Vaccination: Yes Received Pneumococcal Vaccination On or After 60th Birthday: No Implantable Devices None Hospitalization / Surgery History Type of Hospitalization/Surgery Amuptation of 3rd,4th and 5th toes of Right foot;Oral Surgery;Anal Fissure surgery; Cholecystectomy Family and Social History Never smoker; Marital Status - Married; Alcohol Use: Never; Drug Use: No History; Caffeine Use: Daily - T coffee; Financial Concerns: No; Food, ea; Clothing or Shelter Needs: No; Support System Lacking: No; Transportation Concerns: No Electronic Signature(s) Signed: 05/12/2022 12:48:34 PM By: Duanne Guess MD FACS Entered By: Duanne Guess on 05/12/2022  11:04:18 -------------------------------------------------------------------------------- Total Contact Cast Details Patient Name: Date of Service: [name], [name] 05/12/2022 10:00 [name] Medical Record Number: [id number] Patient Account Number: [id number] Date of Birth/Sex: Treating RN: [birth year] (52 y.o. [name] Primary Care Provider: Tana Conch Other Clinician: Referring Provider: Treating Provider/Extender: Arvin Collard in Treatment: 36 T Contact Cast Applied for Wound Assessment: otal Wound #2 Right,Lateral,Plantar Foot Performed By: Physician Duanne Guess, MD Post Procedure Diagnosis Same as Pre-procedure Notes scribed for Dr. Lady Gary by [name], RN Electronic Signature(s) Signed: 05/12/2022 12:48:34 PM By: Duanne Guess MD FACS Signed: 05/12/2022 4:01:39 PM By: [name] Entered By: [name] on 05/12/2022 10:14:28 -------------------------------------------------------------------------------- SuperBill Details Patient Name: Date of Service: [name], [name] 05/12/2022 Medical Record Number: [id number] Patient Account Number: [id number] [name], Zed [name] ([id number]) [id number] Page 11 of 11 Date of Birth/Sex: Treating RN: 1970/02/01 (52 y.o. [name] Primary Care Provider: Tana Conch Other Clinician: Referring Provider: Treating Provider/Extender: Arvin Collard in Treatment: 44 Diagnosis Coding ICD-10 Codes Code Description [phone] Non-pressure chronic ulcer of other part of right foot with necrosis of bone L97.512 Non-pressure chronic ulcer of other part of right foot with fat layer exposed E11.49 Type 2 diabetes mellitus with other diabetic neurological complication M86.671 Other chronic osteomyelitis, right ankle and foot Facility Procedures : CPT4 Code: [id number] Description: 97597 - DEBRIDE WOUND 1ST 20 SQ CM OR < ICD-10 Diagnosis  Description L97.512 Non-pressure chronic ulcer of other part of right foot with  fat layer exposed Modifier: Quantity: 1 : CPT4 Code: [id number] Description: 78295 - APPLY TOTAL CONTACT LEG CAST ICD-10 Diagnosis Description L97.514 Non-pressure chronic ulcer of other part of right foot with necrosis of bone Modifier: Quantity: 1 Physician Procedures : CPT4 Code Description Modifier [id number] 99214 - WC PHYS LEVEL 4 - EST PT 25 ICD-10 Diagnosis Description 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 mellitus with other diabetic neurological complication M86.671 Other chronic osteomyelitis, right ankle and foot Quantity: 1 : [id number] 97597 - WC PHYS DEBR WO ANESTH 20 SQ CM ICD-10 Diagnosis Description L97.512 Non-pressure chronic ulcer of other part of right foot with fat layer exposed Quantity: 1 : [id number] 29445 - WC PHYS APPLY TOTAL CONTACT CAST ICD-10 Diagnosis Description L97.514 Non-pressure chronic ulcer of other part of right foot with necrosis of bone Quantity: 1 Electronic Signature(s) Signed: 05/12/2022 11:16:00 AM By: Duanne Guess MD FACS Entered By: Duanne Guess on 05/12/2022 11:15:59

## 2022-05-19 ENCOUNTER — Encounter (HOSPITAL_BASED_OUTPATIENT_CLINIC_OR_DEPARTMENT_OTHER): Payer: BLUE CROSS/BLUE SHIELD | Admitting: General Surgery

## 2022-05-19 DIAGNOSIS — E11621 Type 2 diabetes mellitus with foot ulcer: Secondary | ICD-10-CM | POA: Diagnosis not present

## 2022-05-20 NOTE — Progress Notes (Signed)
[name], [name] ([id number]) [id number] Page 1 of 8 Visit Report for 05/19/2022 Arrival Information Details Patient Name: Date of Service: [place], [name] 05/19/2022 10:45 [name] Medical Record Number: [id number] Patient Account Number: [id number] Date of Birth/Sex: Treating RN: February 07, 1970 (52 y.o. Yates Decamp Primary Care Gaige Sebo: Tana Conch Other Clinician: Referring Gared Gillie: Treating Reshaun Briseno/Extender: Arvin Collard in Treatment: 45 Visit Information History Since Last Visit All ordered tests and consults were completed: Yes Patient Arrived: Ambulatory Added or deleted any medications: No Arrival Time: 10:46 Any new allergies or adverse reactions: No Accompanied By: self Had a fall or experienced change in No Transfer Assistance: None activities of daily living that may affect Patient Identification Verified: Yes risk of falls: Secondary Verification Process Completed: Yes Signs or symptoms of abuse/neglect since last visito No Patient Requires Transmission-Based Precautions: No Hospitalized since last visit: No Patient Has Alerts: Yes Implantable device outside of the clinic excluding No cellular tissue based products placed in the center since last visit: Has Dressing in Place as Prescribed: Yes Pain Present Now: No Electronic Signature(s) Signed: 05/19/2022 4:01:24 PM By: Brenton Grills Entered By: Brenton Grills on 05/19/2022 10:46:56 -------------------------------------------------------------------------------- Encounter Discharge Information Details Patient Name: Date of Service: [name], [name] 05/19/2022 10:45 [name] Medical Record Number: [id number] Patient Account Number: [id number] Date of Birth/Sex: Treating RN: 02/24/1970 (52 y.o. Yates Decamp Primary Care Tyrees Chopin: Tana Conch Other Clinician: Referring Codylee Patil: Treating Rhen Kawecki/Extender: Arvin Collard in  Treatment: 45 Encounter Discharge Information Items Discharge Condition: Stable Ambulatory Status: Ambulatory Discharge Destination: Home Transportation: Private Auto Accompanied By: self Schedule Follow-up Appointment: Yes Clinical Summary of Care: Patient Declined Electronic Signature(s) Signed: 05/19/2022 4:01:24 PM By: Brenton Grills Entered By: Brenton Grills on 05/19/2022 11:59:59 -------------------------------------------------------------------------------- Lower Extremity Assessment Details Patient Name: Date of Service: [name], [name] 05/19/2022 10:45 [name] Medical Record Number: [id number] Patient Account Number: [id number] Date of Birth/Sex: Treating RN: 02/14/1970 (52 y.o. Yates Decamp Primary Care Tela Kotecki: Tana Conch Other Clinician: Referring Teion Ballin: Treating Chamar Broughton/Extender: Arvin Collard in Treatment: 45 Edema Assessment S[Left: Edd Fabian [phone] Franne Forts: [id number] Page 2 of 8] Assessed: [Left: No] [Right: No] Edema: [Left: N] [Right: o] Calf Left: Right: Point of Measurement: From Medial Instep 38 cm Ankle Left: Right: Point of Measurement: From Medial Instep 24.6 cm Vascular Assessment Pulses: Dorsalis Pedis Palpable: [Right:Yes] Electronic Signature(s) Signed: 05/19/2022 4:01:24 PM By: Brenton Grills Entered By: Brenton Grills on 05/19/2022 10:59:16 -------------------------------------------------------------------------------- Multi Wound Chart Details Patient Name: Date of Service: [name], [name] 05/19/2022 10:45 [name] Medical Record Number: [id number] Patient Account Number: [id number] Date of Birth/Sex: Treating RN: December 16, 1970 (52 y.o. [name] Primary Care Staphanie Harbison: Tana Conch Other Clinician: Referring Cassius Cullinane: Treating Tkai Large/Extender: Arvin Collard in Treatment: 45 Vital Signs Height(in): 72 Pulse(bpm): 91 Weight(lbs): 312 Blood  Pressure(mmHg): 143/87 Body Mass Index(BMI): 42.3 Temperature(F): 98.1 Respiratory Rate(breaths/min): 18 [2:Photos: No Photos Right, Lateral, Plantar Foot Wound Location: Gradually Appeared Wounding Event: Diabetic Wound/Ulcer of the Lower Primary Etiology: Extremity Type II Diabetes Comorbid History: 12/19/2016 Date Acquired: 45 Weeks of Treatment: Healed -  Epithelialized Wound Status: No Wound Recurrence: 0x0x0 Measurements L x W x D (cm) 0 A (cm) : rea 0 Volume (cm) : 100.00% % Reduction in A rea: 100.00% % Reduction in Volume: Grade 3 Classification: None Present Exudate A mount: N/A Exudate Type: N/A  Exudate Color: Thickened Wound Margin: None Present (0%) Granulation A  mount: N/A Granulation Quality: None Present (0%) Necrotic A mount: Fascia: No Exposed Structures: Fat Layer (Subcutaneous Tissue): No Tendon: No Muscle: No Joint: No Bone: No None  Epithelialization: Callus: Yes Periwound Skin Texture: Maceration: No Periwound Skin Moisture: Dry/Scaly: No No Abnormalities Noted Periwound Skin Color:] [4:No Photos Right, Distal, Lateral Foot Blister Diabetic Wound/Ulcer of the Lower Extremity Type  II Diabetes 05/06/2022 1 Open No 1.1x1.2x0.1 1.037 0.104 34.00% 33.80% Grade 1 Medium Purulent yellow, brown, green Distinct, outline attached Large (67-100%) Red Small (1-33%) Fat Layer (Subcutaneous Tissue): Yes N/A Fascia: No Tendon: No Muscle: No  Joint: No Bone: No Small (1-33%) Callus: Yes Dry/Scaly: Yes No Abnormalities Noted] [N/A:N/A N/A N/A N/A N/A N/A N/A N/A N/A N/A N/A N/A N/A N/A N/A N/A N/A N/A N/A N/A N/A N/A N/A N/A N/A N/A] [name], [name] ([id number]) [2:No Abnormality Temperature: N/A Procedures Performed:] [4:No Abnormality T Contact Cast otal] [N/A:[id number] Page 3 of 8 N/A N/A] Treatment Notes Wound #2 (Foot) Wound Laterality: Plantar, Right, Lateral Cleanser Peri-Wound Care Topical Primary Dressing Secondary Dressing Secured With Compression  Wrap Compression Stockings Add-Ons Wound #4 (Foot) Wound Laterality: Right, Lateral, Distal Cleanser Soap and Water Discharge Instruction: May shower and wash wound with dial antibacterial soap and water prior to dressing change. Wound Cleanser Discharge Instruction: Cleanse the wound with wound cleanser prior to applying a clean dressing using gauze sponges, not tissue or cotton balls. Peri-Wound Care Topical Gentamicin Discharge Instruction: As directed by physician Primary Dressing Maxorb Extra Ag+ Alginate Dressing, 2x2 (in/in) Discharge Instruction: Apply to wound bed as instructed Secondary Dressing Optifoam Non-Adhesive Dressing, 4x4 in Discharge Instruction: Apply over primary dressing as directed. Woven Gauze Sponge, Non-Sterile 4x4 in Discharge Instruction: Apply over primary dressing as directed. Zetuvit Plus 4x8 in Discharge Instruction: Apply over primary dressing as directed. Secured With Paper Tape, 2x10 (in/yd) Discharge Instruction: Secure dressing with tape as directed. Compression Wrap Kerlix Roll 4.5x3.1 (in/yd) Discharge Instruction: Apply Kerlix and Coban compression as directed. Compression Stockings Add-Ons Electronic Signature(s) Signed: 05/19/2022 12:25:14 PM By: Duanne Guess MD FACS Entered By: Duanne Guess on 05/19/2022 12:25:14 -------------------------------------------------------------------------------- Multi-Disciplinary Care Plan Details Patient Name: Date of Service: [name] [name] 05/19/2022 10:45 [name] Medical Record Number: [id number] Patient Account Number: [id number] Date of Birth/Sex: Treating RN: 1970-11-05 (52 y.o. [name] Iverson, Donna [name], [name] ([id number]) [id number] Page 4 of 8 Primary Care Tory Mckissack: Tana Conch Other Clinician: Referring Shakema Surita: Treating Ailis Rigaud/Extender: Arvin Collard in Treatment: 45 Multidisciplinary Care Plan reviewed with  physician Active Inactive Nutrition Nursing Diagnoses: Impaired glucose control: actual or potential Potential for alteratiion in Nutrition/Potential for imbalanced nutrition Goals: Patient/caregiver will maintain therapeutic glucose control Date Initiated: 07/29/2021 Target Resolution Date: 06/13/2022 Goal Status: Active Interventions: Assess patient nutrition upon admission and as needed per policy Provide education on elevated blood sugars and impact on wound healing Treatment Activities: Patient referred to Primary Care Physician for further nutritional evaluation : 07/29/2021 Notes: Wound/Skin Impairment Nursing Diagnoses: Impaired tissue integrity Goals: Patient/caregiver will verbalize understanding of skin care regimen Date Initiated: 07/04/2021 Target Resolution Date: 06/13/2022 Goal Status: Active Interventions: Assess ulceration(s) every visit Treatment Activities: Skin care regimen initiated : 07/04/2021 Notes: Electronic Signature(s) Signed: 05/19/2022 4:01:24 PM By: Brenton Grills Entered By: Brenton Grills on 05/19/2022 11:56:59 -------------------------------------------------------------------------------- Pain Assessment Details Patient Name: Date of Service: [name] [name] 05/19/2022 10:45 [name] Medical Record Number: [id number] Patient Account Number: [id number] Date of Birth/Sex: Treating RN: [birth year] (52 y.o. Yates Decamp  Primary Care Jadore Veals: Tana Conch Other Clinician: Referring Talani Brazee: Treating Ervine Witucki/Extender: Arvin Collard in Treatment: 45 Active Problems Location of Pain Severity and Description of Pain Patient Has Paino No Site Locations [name], [name] ([id number]) [id number] Page 5 of 8 Pain Management and Medication Current Pain Management: Electronic Signature(s) Signed: 05/19/2022 4:01:24 PM By: Brenton Grills Entered By: Brenton Grills on 05/19/2022  10:48:37 -------------------------------------------------------------------------------- Patient/Caregiver Education Details Patient Name: Date of Service: Oletta [place], [name] 4/29/2024andnbsp10:45 [name] Medical Record Number: [id number] Patient Account Number: [id number] Date of Birth/Gender: Treating RN: 11-13-1970 (52 y.o. Yates Decamp Primary Care Physician: Tana Conch Other Clinician: Referring Physician: Treating Physician/Extender: Arvin Collard in Treatment: 45 Education Assessment Education Provided To: Patient Education Topics Provided Wound/Skin Impairment: Methods: Explain/Verbal Responses: State content correctly [employer]) Signed: 05/19/2022 4:01:24 PM By: Brenton Grills Entered By: Brenton Grills on 05/19/2022 11:05:51 -------------------------------------------------------------------------------- Wound Assessment Details Patient Name: Date of Service: [name] 05/19/2022 10:45 [name] Medical Record Number: [id number] Patient Account Number: [id number] Date of Birth/Sex: Treating RN: 1970/08/31 (52 y.o. Yates Decamp Primary Care Niesha Bame: Tana Conch Other Clinician: Referring Mariella Blackwelder: Treating Shaunte Weissinger/Extender: Arvin Collard in Treatment: 45 Wound Status Wound Number: 2 Primary Etiology: Diabetic Wound/Ulcer of the Lower Extremity Wound Location: Right, Lateral, Plantar Foot Wound Status: Healed - Epithelialized Wounding Event: Gradually Appeared Comorbid History: Type II Diabetes Date Acquired: 12/19/2016 Weeks Of Treatment: 45 Clustered Wound: No [name], [name] ([id number]) [id number].pdf Page 6 of 8 Wound Measurements Length: (cm) Width: (cm) Depth: (cm) Area: (cm) Volume: (cm) 0 % Reduction in Area: 100% 0 % Reduction in Volume: 100% 0 Epithelialization: None 0 Tunneling: No 0 Undermining: No Wound Description Classification: Grade  3 Wound Margin: Thickened Exudate Amount: None Present Foul Odor After Cleansing: No Slough/Fibrino No Wound Bed Granulation Amount: None Present (0%) Exposed Structure Necrotic Amount: None Present (0%) Fascia Exposed: No Fat Layer (Subcutaneous Tissue) Exposed: No Tendon Exposed: No Muscle Exposed: No Joint Exposed: No Bone Exposed: No Periwound Skin Texture Texture Color No Abnormalities Noted: No No Abnormalities Noted: Yes Callus: Yes Temperature / Pain Temperature: No Abnormality Moisture No Abnormalities Noted: No Dry / Scaly: No Maceration: No Treatment Notes Wound #2 (Foot) Wound Laterality: Plantar, Right, Lateral Cleanser Peri-Wound Care Topical Primary Dressing Secondary Dressing Secured With Compression Wrap Compression Stockings Add-Ons Electronic Signature(s) Signed: 05/19/2022 4:01:24 PM By: Brenton Grills Entered By: Brenton Grills on 05/19/2022 11:23:02 -------------------------------------------------------------------------------- Wound Assessment Details Patient Name: Date of Service: [name], [name] 05/19/2022 10:45 [name] Medical Record Number: [id number] Patient Account Number: [id number] Date of Birth/Sex: Treating RN: November 09, 1970 (52 y.o. Yates Decamp Primary Care Kilie Rund: Tana Conch Other Clinician: Referring Nasiya Pascual: Treating Emiel Kielty/Extender: Arvin Collard in Treatment: 45 Wound Status Wound Number: 4 Primary Etiology: Diabetic Wound/Ulcer of the Lower Extremity Wound Location: Right, Distal, Lateral Foot Wound Status: Open Wounding Event: Blister Comorbid History: Type II Diabetes Date Acquired: 05/06/2022 Weeks Of Treatment: 1 Clustered Wound: No [name], [name] ([id number]) [phone].pdf Page 7 of 8 Wound Measurements Length: (cm) 1.1 Width: (cm) 1.2 Depth: (cm) 0.1 Area: (cm) 1.037 Volume: (cm) 0.104 % Reduction in Area: 34% % Reduction in Volume:  33.8% Epithelialization: Small (1-33%) Tunneling: No Undermining: No Wound Description Classification: Grade 1 Wound Margin: Distinct, outline attached Exudate Amount: Medium Exudate Type: Purulent Exudate Color: yellow, brown, green Foul Odor After Cleansing: No Slough/Fibrino Yes Wound Bed Granulation Amount: Large (67-100%) Exposed  Structure Granulation Quality: Red Fascia Exposed: No Necrotic Amount: Small (1-33%) Fat Layer (Subcutaneous Tissue) Exposed: Yes Necrotic Quality: Adherent Slough Tendon Exposed: No Muscle Exposed: No Joint Exposed: No Bone Exposed: No Periwound Skin Texture Texture Color No Abnormalities Noted: No No Abnormalities Noted: Yes Callus: Yes Temperature / Pain Temperature: No Abnormality Moisture No Abnormalities Noted: No Dry / Scaly: Yes Treatment Notes Wound #4 (Foot) Wound Laterality: Right, Lateral, Distal Cleanser Soap and Water Discharge Instruction: May shower and wash wound with dial antibacterial soap and water prior to dressing change. Wound Cleanser Discharge Instruction: Cleanse the wound with wound cleanser prior to applying a clean dressing using gauze sponges, not tissue or cotton balls. Peri-Wound Care Topical Gentamicin Discharge Instruction: As directed by physician Primary Dressing Maxorb Extra Ag+ Alginate Dressing, 2x2 (in/in) Discharge Instruction: Apply to wound bed as instructed Secondary Dressing Optifoam Non-Adhesive Dressing, 4x4 in Discharge Instruction: Apply over primary dressing as directed. Woven Gauze Sponge, Non-Sterile 4x4 in Discharge Instruction: Apply over primary dressing as directed. Zetuvit Plus 4x8 in Discharge Instruction: Apply over primary dressing as directed. Secured With Paper Tape, 2x10 (in/yd) Discharge Instruction: Secure dressing with tape as directed. Compression Wrap Kerlix Roll 4.5x3.1 (in/yd) Discharge Instruction: Apply Kerlix and Coban compression as directed. Compression  Stockings Add-Ons Electronic Signature(s) [name], [name] ([id number]) [id number] Page 8 of 8 Signed: 05/19/2022 4:01:24 PM By: Brenton Grills Entered By: Brenton Grills on 05/19/2022 11:03:59 -------------------------------------------------------------------------------- Vitals Details Patient Name: Date of Service: [name] 05/19/2022 10:45 [name] Medical Record Number: [id number] Patient Account Number: [id number] Date of Birth/Sex: Treating RN: [birth year] (52 y.o. Yates Decamp Primary Care Tresten Pantoja: Tana Conch Other Clinician: Referring Haidyn Kilburg: Treating Shaylon Aden/Extender: Arvin Collard in Treatment: 45 Vital Signs Time Taken: 10:46 Temperature (F): 98.1 Height (in): 72 Pulse (bpm): 91 Weight (lbs): 312 Respiratory Rate (breaths/min): 18 Body Mass Index (BMI): 42.3 Blood Pressure (mmHg): 143/87 Reference Range: 80 - 120 mg / dl Electronic Signature(s) Signed: 05/19/2022 4:01:24 PM By: Brenton Grills Entered By: Brenton Grills on 05/19/2022 10:48:31

## 2022-05-20 NOTE — Progress Notes (Signed)
[name], [name] ([id number]) [id number] Page 1 of 10 Visit Report for 05/19/2022 Chief Complaint Document Details Patient Name: Date of Service: [name], [name] 05/19/2022 10:45 A M Medical Record Number: [id number] Patient Account Number: [id number] Date of Birth/Sex: Treating RN: [birth year] (52 y.o. M) Primary Care Provider: Tana Conch Other Clinician: Referring Provider: Treating Provider/Extender: Arvin Collard in Treatment: 45 Information Obtained from: Patient Chief Complaint Patients presents for treatment of an open diabetic ulcer Electronic Signature(s) Signed: 05/19/2022 12:25:23 PM By: Duanne Guess MD FACS Entered By: Duanne Guess on 05/19/2022 12:25:23 -------------------------------------------------------------------------------- HPI Details Patient Name: Date of Service: [name], [name] 05/19/2022 10:45 A M Medical Record Number: [id number] Patient Account Number: [id number] Date of Birth/Sex: Treating RN: 1970-07-21 (52 y.o. 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 The most recent amputation was in August 2022. At that operation, antibiotic beads were placed in the wound. He has been managed by podiatry for his procedures and management of his wounds. He has been in a Scient[facility], water quality. He is on oral doxycycline. They have been using Betadine wet to dry dressings along with Iodosorb. The patient states that when he thinks the wound is getting too dry, he applies topical Neosporin. At his last visit, on June 7 of this year, the podiatrist  determined that he felt the wound was stalled and referred him to wound care for additional evaluation and management. An MRI has been ordered, but not yet scheduled or performed. Pathology from his operation in August 2022 demonstrated findings consistent with chronic osteomyelitis. Today, there is a large irregular wound on the plantar surface of his right foot, at about the level of the fifth metatarsal head. This tracks through to a pinpoint opening on the dorsal lateral portion of his foot. The intake nurse reported purulent drainage. There is some malodor from the wound. No frank necrosis identified. 07/11/2021: Today, the wounds do not connect. I attempted multiple times from various directions and the shared tunnel is no longer open. He has some slough accumulation on the dorsal part of his foot as well as slough and callus buildup on the plantar surface. His MRI is scheduled for June 29. No purulent drainage or malodor appreciated today. 07/19/2021: The lateral foot wound has closed and there is no tunnel connecting the plantar foot wound to that site. The plantar foot wound still probes quite deeply, however. There is some slough, eschar, and nonviable tissue accumulated in the wound bed. No malodorous drainage present. His MRI was performed yesterday and is consistent with fairly extensive osteomyelitis. 07/29/2021: The patient has an appointment with infectious disease on July 18 to treat his osteomyelitis. The plantar wound still probes quite deeply, approaching bone. The orifice has narrowed quite substantially, however, making it more difficult for him to pack. 08/05/2021: The tunnel connecting the lateral foot wound to the plantar foot wound has reopened. He sees infectious disease tomorrow to discuss long-term antibiotic treatment for osteomyelitis. There was a bit of murky drainage in the wound, but this was noted after he had had topical lidocaine applied so may have just been a blob  of the anesthetic. No odor or frank purulent drainage.  The wound probes deeply at the midfoot approaching bone. He does have MRI results confirming his diagnosis of osteomyelitis. He has failed to progress with conventional treatment and I think his best chance for preservation of the foot is to initiate hyperbaric oxygen therapy. 08/13/2021: The tunnel connecting the 2 wounds has closed again. He has a PICC line and is getting IV daptomycin and cefepime. EKG and chest x-ray are within normal limits. He is scheduled to start hyperbaric oxygen therapy tomorrow. The wound in his midfoot probes deeply, approaching bone. The skin at the orifice continues to heaped up and threatens to close over despite the large cavity within. No erythema, induration, or purulent drainage. The wound on his lateral foot is fairly small and quite clean. 08/19/2021: The lateral foot wound has nearly closed. The wound in his midfoot does not probe quite as deeply today. The skin at the orifice continues to try to roll in and obscure the opening. No frankly necrotic tissue appreciated. He is tolerating hyperbaric oxygen therapy. 08/26/2021: The lateral foot wound has closed completely. The wound in his midfoot is shallower again today. The wound orifice is contracting. We are using gentamicin and silver alginate. He continues on IV daptomycin and cefepime and is tolerating hyperbaric oxygen therapy without difficulty. [name], Edwyn [place]. [place] ([id number]) [id number] Page 2 of 10 09/02/2021: His foot is a little bit sore today but he was up walking on it all weekend doing back to school shopping with his daughter. The tunneling is down to about 3 cm and I do not appreciate bone at the tip of the probe. The wound is clean but has some callus creating an overhanging lip at the distal aspect. He continues to receive hyperbaric oxygen therapy as well as IV daptomycin and cefepime. 09/09/2021: His wound continues to  improve. The tunnel has come in by 0.9 cm. He continues to form callus around the orifice of the wound. He is tolerating hyperbaric oxygen therapy along with his IV antibiotics. 09/16/2021: The tunneling is down to just half a centimeter. He continues to build up callus around the orifice of the wound. He completed his course of IV antibiotics and his PICC line is scheduled to be removed this Friday. He is tolerating hyperbaric oxygen without difficulty. We have been applying topical gentamicin and silver alginate to his wound. 09/24/2021: The wound depth has come in even further. There is a little bit of callus buildup around the orifice. The opening is quite narrow, at this point. 09/30/2021: The wound depth is about the same this week. The opening continues to contract with callus accumulation. There is some discoloration of the skin on the lateral part of his foot and he admitted to walking more than usual this weekend and also wearing a different pair shoes. He continues to tolerate hyperbaric oxygen therapy without difficulty. 10/07/2021: The wound depth has contracted a bit and is now approximately 1 to 1.5 cm. The orifice of the wound continues to try and close in over the space. There is just some callus and skin around the margins obscuring the orifice. 10/14/2021: The wound depth has come in a little bit more. The orifice of the wound is rolling inwards with epithelium and callus, making it difficult to pack the wound. 10/21/2021: There is still 1 portion of the wound, at the lateral aspect, that is still a couple of centimeters deep. The architecture of the wound makes this somewhat challenging to access and pack. Everything else looks clean. He has his  usual accumulation of callus and skin, narrowing the orifice. 10/29/2021: No significant change to the depth of the wound. The orifice continues to contract with callus and skin narrowing the opening. His wife has been packing the wound and doing  an excellent job. 11/05/2021: The depth of the wound has come in by about a centimeter. There is less callus accumulation around the orificedespite this, the wound is narrower today. We are still awaiting insurance approval for Kerecis powder. 11/12/2021: The depth of the wound continues to contract. He has accumulated some callus around the orifice, as usual. We have received a verbal confirmation that he is approved for Kerecis, but no formal paperwork has yet been received. 11/19/2021: The depth continues to come in. There is callus accumulation around the orifice, as usual. He has been formally approved for Kerecis, but unfortunately we do not have a billing mechanism for the powder in [employer] yet. We are working to address this so we can begin treatment. 11/25/2021: Continued filling in of the depth of the wound. Continued buildup of callus around the orifice. No concern for infection. As we do not have a way to bill for Kerecis powder, but are capable of billing for the [facility] sheet, we are going to use that on him instead. 12/03/2021: The depth of the wound continues to fill in. As usual, he has accumulated callus and skin around the orifice of the wound. No concern for infection. He will complete his hyperbaric oxygen therapy this week. 12/17/2021: The wound depth continues to contract. There is callus and skin accumulation around the orifice of the wound, as per usual. There is more epithelium on the actual wound surface. Unfortunately, his Leroy Kennedy was not ordered. 12/24/2021: The wound depth has come in a little bit more this week. There is less skin accumulation around the orifice, but still some callus. On questioning, he admits to spending a fair amount of time on his feet. 12/12; this is a very difficult wound to assess size slitlike wound with some depth but minimal opening. I applied Kerecis No. 4 12/18; Kerecis No. 5 01/15/2022: His wound measured deeper today. There is also some  evidence of pressure induced tissue injury around the lateral aspect of his foot. The patient reports that he was wearing different shoes than usual and felt like his foot was rolling a lot over the weekend. There is heavy callus around the orifice of the wound. No concern for infection. 01/21/2022: The depth decreased today from 1.7 to 1.0 cm. Significantly less tissue damage as compared to last week. Still with callus and skin accumulation around the orifice. 01/29/2022: The wound continues to contract and the depth is coming in further. We are still working on getting [employer] for him. 02/04/2022: No real change this week. Still working on the [employer]. 02/11/2022: No significant changes again. We are still working on the [employer]. 02/18/2022: No change to the wound. We have submitted in IVR for [place]. 02/25/2022: Unfortunately, his wound has deteriorated over the past week. It is deeper and is approaching bone once again. There is more slough accumulation and the tissue shows signs of pressure injury. He says that he has been trying to wear his new work boots, which are waterproof. He says they make his feet sweat excessively. I think our main challenge with him is that we just are not able to adequately offload him while he is still working and on his feet through much of the day. He is willing  to consider total contact casting, but will need to make some arrangements with work. On a more positive note, however, he has been approved for [employer]. 03/03/2022: The wound probed a little bit deeper again today. We are planning to put him in a total contact cast as soon as he has cleared it with work to take some time off. 03/10/2022: The wound was a little bit shallower today and the overall appearance is significantly improved. We are planning to put him in a cast today. 03/12/2022: He is here for his first total contact cast change. He denies any issues with pressure, rubbing, or  pain. 03/17/2022: For some reason, the wound probes deeper today. There is some slough and nonviable subcutaneous tissue around the opening. He denies any difficulty with the total contact cast. 03/24/2022: The wound is shallower today. There is better granulation tissue visible on the wound surface. 03/31/2022: His cast protector got a hole in it and his cast got wet. His foot is macerated. The wound measured a little deeper today. There is some hypertrophic granulation tissue at the orifice of his wound. 3/18; patient presents for follow-up. He had no issues with the total contact cast. We have been using epi cord to the wound bed. Overall wound is stable. Patient denies signs of infection. [name], Angle Stark ([id number]) [id number] Page 3 of 10 04/14/2022: The depth of the wound has rather remarkably decreased to 1.3 cm today. The orifice of the wound has also contracted quite a bit. 04/21/2022: The depth of the wound has decreased even further. The orifice is also smaller. The wound is very clean. 04/28/2022: The depth of the wound is down to 0.5 cm. The orifice continues to contract. 05/06/2022: The primary wound is substantially smaller and shallower today. Unfortunately, he developed a blister distal to this on the plantar surface of his foot. The tissue breakdown underneath the blister does extend into the fat layer in 1 area over a bony prominence from his collapsed arch. The remainder of the tissue underneath the blister is intact skin. 05/12/2022: The primary wound is down to just a couple of millimeters and is flush with the surrounding skin. The area that had blistered last week had some purulent drainage coming follow-up a corner that was underneath some dry skin. No malodor associated with this. There is some additional slough that has accumulated. 05/19/2022: His primary wound has actually healed. The area that opened after a blister formed is smaller. There is a little  bit of thickened skin around the edges, no concern for infection. Electronic Signature(s) Signed: 05/19/2022 12:26:03 PM By: Duanne Guess MD FACS Entered By: Duanne Guess on 05/19/2022 12:26:02 -------------------------------------------------------------------------------- Physical Exam Details Patient Name: Date of Service: [name], [name] 05/19/2022 10:45 A M Medical Record Number: [id number] Patient Account Number: [id number] Date of Birth/Sex: Treating RN: 03/02/1970 (52 y.o. M) Primary Care Provider: Tana Conch Other Clinician: Referring Provider: Treating Provider/Extender: Arvin Collard in Treatment: 45 Constitutional Slightly hypertensive. . . . no acute distress. Respiratory Normal work of breathing on room air. Notes 05/19/2022: His primary wound has actually healed. The area that opened after a blister formed is smaller. There is a little bit of thickened skin around the edges, no concern for infection. Electronic Signature(s) Signed: 05/19/2022 12:33:51 PM By: Duanne Guess MD FACS Previous Signature: 05/19/2022 12:26:28 PM Version By: Duanne Guess MD FACS Entered By: Duanne Guess on 05/19/2022 12:33:51 -------------------------------------------------------------------------------- Physician Orders Details Patient Name: Date of Service: [name],  [name] 05/19/2022 10:45 A M Medical Record Number: [id number] Patient Account Number: [id number] Date of Birth/Sex: Treating RN: Jun 19, 1970 (52 y.o. 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 - Dr Lady Gary RM 2 Return A Bathing/ Shower/ Hygiene May shower with protection but do not get wound dressing(s) wet. Protect dressing(s) with water repellant cover (for example, large plastic [name], Evart M ([id number]) [id number] Page 4 of 10 bag) or a cast cover and may then take shower. Off-Loading Total Contact Cast to Right Lower Extremity Other: - Try to not stand on your feet too much, minimize walking Wound Treatment Wound #4 - Foot Wound Laterality: Right, Lateral, Distal Cleanser: Soap and Water 1 x Per Week/30 Days Discharge Instructions: May shower and wash wound with dial antibacterial soap and water prior to dressing change. Cleanser: Wound Cleanser (Generic) 1 x Per Week/30 Days Discharge Instructions: Cleanse the wound with wound cleanser prior to applying a clean dressing using gauze sponges, not tissue or cotton balls. Topical: Gentamicin 1 x Per Week/30 Days Discharge Instructions: As directed by physician Prim Dressing: Maxorb Extra Ag+ Alginate Dressing, 2x2 (in/in) 1 x Per Week/30 Days ary Discharge Instructions: Apply to wound bed as instructed Secondary Dressing: Optifoam Non-Adhesive Dressing, 4x4 in 1 x Per Week/30 Days Discharge Instructions: Apply over primary dressing as directed. Secondary Dressing: Woven Gauze Sponge, Non-Sterile 4x4 in 1 x Per Week/30 Days Discharge Instructions: Apply over primary dressing as directed. Secondary Dressing: Zetuvit Plus 4x8 in 1 x Per Week/30 Days Discharge Instructions: Apply over primary dressing as directed. Secured With: Paper Tape, 2x10 (in/yd) (Generic) 1 x Per Week/30 Days Discharge Instructions: Secure dressing with tape as directed. Compression Wrap: Kerlix Roll 4.5x3.1 (in/yd) (Generic) 1 x Per Week/30 Days Discharge Instructions: Apply Kerlix and Coban compression as directed. Electronic  Signature(s) Signed: 05/19/2022 4:00:45 PM By: Duanne Guess MD FACS Entered By: Duanne Guess on 05/19/2022 12:34:07 -------------------------------------------------------------------------------- Problem List Details Patient Name: Date of Service: [name], [name] 05/19/2022 10:45 A M Medical Record Number: [id number] Patient Account Number: [id number] Date of Birth/Sex: Treating RN: 03/26/1970 (52 y.o. 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 07/04/2021 No Yes M86.671 Other chronic osteomyelitis, right ankle and foot 07/04/2021 No Yes Inactive Problems Resolved Problems [name], Joaquin M ([id number]) [id number] Page 5 of 10 ICD-10 Code Description Active Date Resolved Date L97.514 Non-pressure chronic ulcer of other part of right foot with necrosis of bone 07/04/2021 07/04/2021 Electronic Signature(s) Signed: 05/19/2022 12:24:48 PM By: Duanne Guess MD FACS Entered By: Duanne Guess on 05/19/2022 12:24:48 -------------------------------------------------------------------------------- Progress Note Details Patient Name: Date of Service: [name], [name] 05/19/2022 10:45 A M Medical Record Number: [id number] Patient Account Number: [id number] Date of Birth/Sex: Treating RN: 18-Dec-1970 (52 y.o. 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 The most recent amputation was in August 2022. At that operation, antibiotic beads were placed in the wound. He has been managed by podiatry for his procedures and management of his wounds. He has been in a Scientist, water quality. He is on oral doxycycline. They have been using Betadine wet to dry dressings along with Iodosorb. The patient states that when he thinks the wound is getting too dry, he applies topical Neosporin. At his last visit, on June 7 of this year, the podiatrist determined that he felt the wound was stalled and referred him to wound care for additional evaluation and management. An MRI has been ordered, but not yet scheduled or performed. Pathology from his operation in August 2022 demonstrated findings consistent with chronic osteomyelitis. Today, there is a large irregular wound on the plantar surface of his right foot, at about the level of the fifth metatarsal head. This tracks through to a pinpoint opening on the dorsal lateral portion of his foot. The intake nurse reported purulent drainage. There is some malodor from the wound. No frank necrosis identified. 07/11/2021: Today, the wounds do not connect. I attempted multiple times from various directions and the shared tunnel is no longer open. He has some slough accumulation on the dorsal part of his foot as well as slough and callus buildup on the plantar surface. His MRI is scheduled for June 29. No purulent drainage or malodor appreciated today. 07/19/2021: The lateral foot wound has closed and there is no tunnel connecting the plantar foot wound to that site. The plantar foot wound still probes quite deeply, however. There is some slough, eschar, and nonviable tissue accumulated in the  wound bed. No malodorous drainage present. His MRI was performed yesterday and is consistent with fairly extensive osteomyelitis. 07/29/2021: The patient has an appointment with infectious disease on July 18 to treat his osteomyelitis. The plantar wound still probes quite deeply, approaching bone. The orifice has narrowed quite substantially, however, making it more difficult for him to pack. 08/05/2021: The tunnel connecting the lateral foot wound to the plantar foot wound has reopened. He sees infectious disease tomorrow to discuss long-term antibiotic treatment for osteomyelitis. There was a bit of murky drainage in the wound, but this was noted after he had had topical lidocaine applied so may have just been a blob of the anesthetic. No odor or frank purulent drainage. The wound probes deeply at the midfoot approaching bone. He does have MRI results confirming his diagnosis of osteomyelitis. He has failed to progress with conventional treatment and I think his best chance for preservation of the foot is to initiate hyperbaric oxygen therapy. 08/13/2021: The tunnel connecting the 2 wounds has closed again. He has a PICC line and is getting IV daptomycin and cefepime. EKG and chest x-ray are within normal limits. He is scheduled to start hyperbaric oxygen therapy tomorrow. The wound in his midfoot probes deeply, approaching bone. The skin at the orifice continues to heaped up and threatens to close over despite the large cavity within. No erythema, induration, or purulent drainage. The wound on his lateral foot is fairly small and quite clean. 08/19/2021: The lateral foot wound has  nearly closed. The wound in his midfoot does not probe quite as deeply today. The skin at the orifice continues to try to roll in and obscure the opening. No frankly necrotic tissue appreciated. He is tolerating hyperbaric oxygen therapy. 08/26/2021: The lateral foot wound has closed completely. The wound in his midfoot is  shallower again today. The wound orifice is contracting. We are using gentamicin and silver alginate. He continues on IV daptomycin and cefepime and is tolerating hyperbaric oxygen therapy without difficulty. 09/02/2021: His foot is a little bit sore today but he was up walking on it all weekend doing back to school shopping with his daughter. The tunneling is down to about 3 cm and I do not appreciate bone at the tip of the probe. The wound is clean but has some callus creating an overhanging lip at the distal aspect. He continues to receive hyperbaric oxygen therapy as well as IV daptomycin and cefepime. 09/09/2021: His wound continues to improve. The tunnel has come in by 0.9 cm. He continues to form callus around the orifice of the wound. He is tolerating hyperbaric oxygen therapy along with his IV antibiotics. 09/16/2021: The tunneling is down to just half a centimeter. He continues to build up callus around the orifice of the wound. He completed his course of IV antibiotics and his PICC line is scheduled to be removed this Friday. He is tolerating hyperbaric oxygen without difficulty. We have been applying topical gentamicin and silver alginate to his wound. 09/24/2021: The wound depth has come in even further. There is a little bit of callus buildup around the orifice. The opening is quite narrow, at this point. [name], Segundo Plymouth ([id number]) [id number] Page 6 of 10 09/30/2021: The wound depth is about the same this week. The opening continues to contract with callus accumulation. There is some discoloration of the skin on the lateral part of his foot and he admitted to walking more than usual this weekend and also wearing a different pair shoes. He continues to tolerate hyperbaric oxygen therapy without difficulty. 10/07/2021: The wound depth has contracted a bit and is now approximately 1 to 1.5 cm. The orifice of the wound continues to try and close in over the  space. There is just some callus and skin around the margins obscuring the orifice. 10/14/2021: The wound depth has come in a little bit more. The orifice of the wound is rolling inwards with epithelium and callus, making it difficult to pack the wound. 10/21/2021: There is still 1 portion of the wound, at the lateral aspect, that is still a couple of centimeters deep. The architecture of the wound makes this somewhat challenging to access and pack. Everything else looks clean. He has his usual accumulation of callus and skin, narrowing the orifice. 10/29/2021: No significant change to the depth of the wound. The orifice continues to contract with callus and skin narrowing the opening. His wife has been packing the wound and doing an excellent job. 11/05/2021: The depth of the wound has come in by about a centimeter. There is less callus accumulation around the orificeoodespite this, the wound is narrower today. We are still awaiting insurance approval for Kerecis powder. 11/12/2021: The depth of the wound continues to contract. He has accumulated some callus around the orifice, as usual. We have received a verbal confirmation that he is approved for Kerecis, but no formal paperwork has yet been received. 11/19/2021: The depth continues to come in. There is callus accumulation around the orifice, as usual.  He has been formally approved for Kerecis, but unfortunately we do not have a billing mechanism for the powder in [employer] yet. We are working to address this so we can begin treatment. 11/25/2021: Continued filling in of the depth of the wound. Continued buildup of callus around the orifice. No concern for infection. As we do not have a way to bill for Kerecis powder, but are capable of billing for the [facility] sheet, we are going to use that on him instead. 12/03/2021: The depth of the wound continues to fill in. As usual, he has accumulated callus and skin around the orifice of the wound. No concern  for infection. He will complete his hyperbaric oxygen therapy this week. 12/17/2021: The wound depth continues to contract. There is callus and skin accumulation around the orifice of the wound, as per usual. There is more epithelium on the actual wound surface. Unfortunately, his Darlen Round was not ordered. 12/24/2021: The wound depth has come in a little bit more this week. There is less skin accumulation around the orifice, but still some callus. On questioning, he admits to spending a fair amount of time on his feet. 12/12; this is a very difficult wound to assess size slitlike wound with some depth but minimal opening. I applied Kerecis No. 4 12/18; Kerecis No. 5 01/15/2022: His wound measured deeper today. There is also some evidence of pressure induced tissue injury around the lateral aspect of his foot. The patient reports that he was wearing different shoes than usual and felt like his foot was rolling a lot over the weekend. There is heavy callus around the orifice of the wound. No concern for infection. 01/21/2022: The depth decreased today from 1.7 to 1.0 cm. Significantly less tissue damage as compared to last week. Still with callus and skin accumulation around the orifice. 01/29/2022: The wound continues to contract and the depth is coming in further. We are still working on getting [employer] for him. 02/04/2022: No real change this week. Still working on the [employer]. 02/11/2022: No significant changes again. We are still working on the [employer]. 02/18/2022: No change to the wound. We have submitted in IVR for [employer]. 02/25/2022: Unfortunately, his wound has deteriorated over the past week. It is deeper and is approaching bone once again. There is more slough accumulation and the tissue shows signs of pressure injury. He says that he has been trying to wear his new work boots, which are waterproof. He says they make his feet sweat excessively. I think our main challenge  with him is that we just are not able to adequately offload him while he is still working and on his feet through much of the day. He is willing to consider total contact casting, but will need to make some arrangements with work. On a more positive note, however, he has been approved for [employer]. 03/03/2022: The wound probed a little bit deeper again today. We are planning to put him in a total contact cast as soon as he has cleared it with work to take some time off. 03/10/2022: The wound was a little bit shallower today and the overall appearance is significantly improved. We are planning to put him in a cast today. 03/12/2022: He is here for his first total contact cast change. He denies any issues with pressure, rubbing, or pain. 03/17/2022: For some reason, the wound probes deeper today. There is some slough and nonviable subcutaneous tissue around the opening. He denies any difficulty with the total  contact cast. 03/24/2022: The wound is shallower today. There is better granulation tissue visible on the wound surface. 03/31/2022: His cast protector got a hole in it and his cast got wet. His foot is macerated. The wound measured a little deeper today. There is some hypertrophic granulation tissue at the orifice of his wound. 3/18; patient presents for follow-up. He had no issues with the total contact cast. We have been using epi cord to the wound bed. Overall wound is stable. Patient denies signs of infection. 04/14/2022: The depth of the wound has rather remarkably decreased to 1.3 cm today. The orifice of the wound has also contracted quite a bit. 04/21/2022: The depth of the wound has decreased even further. The orifice is also smaller. The wound is very clean. 04/28/2022: The depth of the wound is down to 0.5 cm. The orifice continues to contract. 05/06/2022: The primary wound is substantially smaller and shallower today. Unfortunately, he developed a blister distal to this on the plantar surface of  his foot. The tissue breakdown underneath the blister does extend into the fat layer in 1 area over a bony prominence from his collapsed arch. The remainder of the tissue underneath the blister is intact skin. 05/12/2022: The primary wound is down to just a couple of millimeters and is flush with the surrounding skin. The area that had blistered last week had some [name], Raymir M ([id number]) [id number] Page 7 of 10 purulent drainage coming follow-up a corner that was underneath some dry skin. No malodor associated with this. There is some additional slough that has accumulated. 05/19/2022: His primary wound has actually healed. The area that opened after a blister formed is smaller. There is a little bit of thickened skin around the edges, no concern for infection. Patient History Information obtained from Patient. Social History Never smoker, Marital Status - Married, Alcohol Use - Never, Drug Use - No History, Caffeine Use - Daily - T coffee. ea; Medical History Endocrine Patient has history of Type II Diabetes Hospitalization/Surgery History - Amuptation of 3rd,4th and 5th toes of Right foot;Oral Surgery;Anal Fissure surgery; Cholecystectomy. Objective Constitutional Slightly hypertensive. no acute distress. Vitals Time Taken: 10:46 AM, Height: 72 in, Weight: 312 lbs, BMI: 42.3, Temperature: 98.1 F, Pulse: 91 bpm, Respiratory Rate: 18 breaths/min, Blood Pressure: 143/87 mmHg. Respiratory Normal work of breathing on room air. General Notes: 05/19/2022: His primary wound has actually healed. The area that opened after a blister formed is smaller. There is a little bit of thickened skin around the edges, no concern for infection. Integumentary (Hair, Skin) Wound #2 status is Healed - Epithelialized. Original cause of wound was Gradually Appeared. The date acquired was: 12/19/2016. The wound has been in treatment 45 weeks. The wound is located on the  Right,Lateral,Plantar Foot. The wound measures 0cm length x 0cm width x 0cm depth; 0cm^2 area and 0cm^3 volume. There is no tunneling or undermining noted. There is a none present amount of drainage noted. The wound margin is thickened. There is no granulation within the wound bed. There is no necrotic tissue within the wound bed. The periwound skin appearance had no abnormalities noted for color. The periwound skin appearance exhibited: Callus. The periwound skin appearance did not exhibit: Dry/Scaly, Maceration. Periwound temperature was noted as No Abnormality. Wound #4 status is Open. Original cause of wound was Blister. The date acquired was: 05/06/2022. The wound has been in treatment 1 weeks. The wound is located on the Right,Distal,Lateral Foot. The wound measures 1.1cm length x 1.2cm  width x 0.1cm depth; 1.037cm^2 area and 0.104cm^3 volume. There is Fat Layer (Subcutaneous Tissue) exposed. There is no tunneling or undermining noted. There is a medium amount of purulent drainage noted. The wound margin is distinct with the outline attached to the wound base. There is large (67-100%) red granulation within the wound bed. There is a small (1-33%) amount of necrotic tissue within the wound bed including Adherent Slough. The periwound skin appearance had no abnormalities noted for color. The periwound skin appearance exhibited: Callus, Dry/Scaly. Periwound temperature was noted as No Abnormality. 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 There was a T Contact otal Cast Procedure by Duanne Guess, MD. Post procedure Diagnosis Wound #4: Same as  Pre-Procedure Notes: Prepped for total contact cast application by Dr. Lady Gary.. [name], [name] ([id number]) [id number] Page 8 of 10 Plan Follow-up Appointments: Return Appointment in 1 week. - Dr Lady Gary RM 2 Bathing/ Shower/ Hygiene: May shower with protection but do not get wound dressing(s) wet. Protect dressing(s) with water repellant cover (for example, large plastic bag) or a cast cover and may then take shower. Off-Loading: T Contact Cast to Right Lower Extremity otal Other: - Try to not stand on your feet too much, minimize walking WOUND #4: - Foot Wound Laterality: Right, Lateral, Distal Cleanser: Soap and Water 1 x Per Week/30 Days Discharge Instructions: May shower and wash wound with dial antibacterial soap and water prior to dressing change. Cleanser: Wound Cleanser (Generic) 1 x Per Week/30 Days Discharge Instructions: Cleanse the wound with wound cleanser prior to applying a clean dressing using gauze sponges, not tissue or cotton balls. Topical: Gentamicin 1 x Per Week/30 Days Discharge Instructions: As directed by physician Prim Dressing: Maxorb Extra Ag+ Alginate Dressing, 2x2 (in/in) 1 x Per Week/30 Days ary Discharge Instructions: Apply to wound bed as instructed Secondary Dressing: Optifoam Non-Adhesive Dressing, 4x4 in 1 x Per Week/30 Days Discharge Instructions: Apply over primary dressing as directed. Secondary Dressing: Woven Gauze Sponge, Non-Sterile 4x4 in 1 x Per Week/30 Days Discharge Instructions: Apply over primary dressing as directed. Secondary Dressing: Zetuvit Plus 4x8 in 1 x Per Week/30 Days Discharge Instructions: Apply over primary dressing as directed. Secured With: Paper T ape, 2x10 (in/yd) (Generic) 1 x Per Week/30 Days Discharge Instructions: Secure dressing with tape as directed. Com pression Wrap: Kerlix Roll 4.5x3.1 (in/yd) (Generic) 1 x Per Week/30 Days Discharge Instructions: Apply Kerlix and Coban compression  as directed. 05/19/2022: His primary wound has actually healed. The area that opened after a blister formed is smaller. There is a little bit of thickened skin around the edges, no concern for infection. Although the primary wound is healed, I think he would continue to benefit from total contact casting to try and get the other site to close up. The wound bed was prepared for total contact casting. Silver alginate was applied with a generalized foam donut to aid in offloading, followed by the total contact cast. He will follow-up in 1 week. Electronic Signature(s) Signed: 05/19/2022 12:35:13 PM By: Duanne Guess MD FACS Entered By: Duanne Guess on 05/19/2022 12:35:12 -------------------------------------------------------------------------------- HxROS Details Patient Name: Date of Service: [name], [name] 05/19/2022 10:45 A M Medical Record Number: [id number] Patient Account Number:  [id number] Date of Birth/Sex: Treating RN: [birth year] (52 y.o. M) Primary Care Provider: Tana Conch Other Clinician: Referring Provider: Treating Provider/Extender: Arvin Collard in Treatment: 45 Information Obtained From Patient Endocrine Medical History: Positive for: Type II Diabetes Immunizations Pneumococcal Vaccine: Received Pneumococcal Vaccination: Yes Received Pneumococcal Vaccination On or After 60th Birthday: No Implantable Devices None Hospitalization / Surgery History Type of Hospitalization/Surgery Amuptation of 3rd,4th and 5th toes of Right foot;Oral Surgery;Anal Fissure surgery; Cholecystectomy [name], Hays F ([id number]) [id number] Page 9 of 10 Family and Social History Never smoker; Marital Status - Married; Alcohol Use: Never; Drug Use: No History; Caffeine Use: Daily - T coffee; Financial Concerns: No; Food, ea; Clothing or Shelter Needs: No; Support System Lacking: No; Transportation Concerns: No Electronic  Signature(s) Signed: 05/19/2022 4:00:45 PM By: Duanne Guess MD FACS Entered By: Duanne Guess on 05/19/2022 12:26:09 -------------------------------------------------------------------------------- Total Contact Cast Details Patient Name: Date of Service: [name], [name] 05/19/2022 10:45 A M Medical Record Number: [id number] Patient Account Number: [id number] Date of Birth/Sex: Treating RN: [birth year] (52 y.o. Yates Decamp Primary Care Provider: Tana Conch Other Clinician: Referring Provider: Treating Provider/Extender: Arvin Collard in Treatment: 45 T Contact Cast Applied for Wound Assessment: otal Wound #4 Right,Distal,Lateral Foot Performed By: Physician Duanne Guess, MD Post Procedure Diagnosis Same as Pre-procedure Notes Prepped for total contact cast application by Dr. Lady Gary. Electronic Signature(s) Signed: 05/19/2022 4:00:45 PM By: Duanne Guess MD FACS Signed: 05/19/2022 4:01:24 PM By: Brenton Grills Entered By: Brenton Grills on 05/19/2022 11:59:14 -------------------------------------------------------------------------------- SuperBill Details Patient Name: Date of Service: [name], [name] 05/19/2022 Medical Record Number: [id number] Patient Account Number: [id number] Date of Birth/Sex: Treating RN: 12-20-1970 (52 y.o. Yates Decamp Primary Care Provider: Tana Conch Other Clinician: Referring Provider: Treating Provider/Extender: Arvin Collard in Treatment: 45 Diagnosis Coding ICD-10 Codes Code Description [phone] Non-pressure chronic ulcer of other part of right foot with fat layer exposed E11.49 Type 2 diabetes mellitus with other diabetic neurological complication M86.671 Other chronic osteomyelitis, right ankle and foot Facility Procedures : CPT4 Code: [id number] Description: [phone] - APPLY TOTAL CONTACT LEG CAST ICD-10 Diagnosis Description L97.512 Non-pressure chronic ulcer of  other part of right foot with fat layer exposed Modifier: Quantity: 1 Physician Procedures : CPT4 Code Description Modifier [id number] - WC PHYS LEVEL 3 - EST PT ICD-10 Diagnosis Description L97.512 Non-pressure chronic ulcer of other part of right foot with fat layer exposed E11.49 Type 2 diabetes mellitus with other diabetic neurological  complication Quantity: 1 : [phone] [id number] - WC PHYS APPLY TOTAL CONTACT CAST ICD-10 Diagnosis Description [name], Jenaro M ([id number]) [id number] L97.512 Non-pressure chronic ulcer of other part of right foot with fat layer exposed Quantity: 1 .pdf Page 10 of 10 Electronic Signature(s) Signed: 05/19/2022 12:35:33 PM By: Duanne Guess MD FACS Entered By: Duanne Guess on 05/19/2022 12:35:31

## 2022-05-26 ENCOUNTER — Encounter (HOSPITAL_BASED_OUTPATIENT_CLINIC_OR_DEPARTMENT_OTHER): Payer: BLUE CROSS/BLUE SHIELD | Attending: General Surgery | Admitting: General Surgery

## 2022-05-26 DIAGNOSIS — M86671 Other chronic osteomyelitis, right ankle and foot: Secondary | ICD-10-CM | POA: Diagnosis not present

## 2022-05-26 DIAGNOSIS — L97512 Non-pressure chronic ulcer of other part of right foot with fat layer exposed: Secondary | ICD-10-CM | POA: Insufficient documentation

## 2022-05-26 DIAGNOSIS — Z89421 Acquired absence of other right toe(s): Secondary | ICD-10-CM | POA: Insufficient documentation

## 2022-05-26 DIAGNOSIS — E1149 Type 2 diabetes mellitus with other diabetic neurological complication: Secondary | ICD-10-CM | POA: Insufficient documentation

## 2022-05-26 DIAGNOSIS — Z9049 Acquired absence of other specified parts of digestive tract: Secondary | ICD-10-CM | POA: Diagnosis not present

## 2022-05-26 DIAGNOSIS — E11621 Type 2 diabetes mellitus with foot ulcer: Secondary | ICD-10-CM | POA: Diagnosis present

## 2022-05-26 NOTE — Progress Notes (Signed)
[name], [name] ([id number]) [id number] Page 1 of 10 Visit Report for 05/26/2022 Chief Complaint Document Details Patient Name: Date of Service: [name], [name] 05/26/2022 10:00 A M Medical Record Number: [id number] Patient Account Number: [id number] Date of Birth/Sex: Treating RN: [birth year]/[name] (52 y.o. M) Primary Care Provider: Tana Conch Other Clinician: Referring Provider: Treating Provider/Extender: Arvin Collard in Treatment: 46 Information Obtained from: Patient Chief Complaint Patients presents for treatment of an open diabetic ulcer Electronic Signature(s) Signed: 05/26/2022 11:08:27 AM By: Duanne Guess MD FACS Entered By: Duanne Guess on 05/26/2022 11:08:26 -------------------------------------------------------------------------------- HPI Details Patient Name: Date of Service: [name], [name] 05/26/2022 10:00 A M Medical Record Number: [id number] Patient Account Number: [id number] Date of Birth/Sex: Treating RN: [name], [name] (52 y.o. 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 The most recent amputation was in [name] 2022. At that operation, antibiotic beads were placed in the wound. He has been managed by podiatry for his procedures and management of his wounds. He has been in a [facility], water quality. He is on oral doxycycline. They have been using Betadine wet to dry dressings along with Iodosorb. The patient states that when he thinks the wound is getting too dry, he applies topical Neosporin. At his last visit, on June 7 of this year, the podiatrist  determined that he felt the wound was stalled and referred him to wound care for additional evaluation and management. An MRI has been ordered, but not yet scheduled or performed. Pathology from his operation in [name] 2022 demonstrated findings consistent with chronic osteomyelitis. Today, there is a large irregular wound on the plantar surface of his right foot, at about the level of the fifth metatarsal head. This tracks through to a pinpoint opening on the dorsal lateral portion of his foot. The intake nurse reported purulent drainage. There is some malodor from the wound. No frank necrosis identified. 07/11/2021: Today, the wounds do not connect. I attempted multiple times from various directions and the shared tunnel is no longer open. He has some slough accumulation on the dorsal part of his foot as well as slough and callus buildup on the plantar surface. His MRI is scheduled for June 29. No purulent drainage or malodor appreciated today. 07/19/2021: The lateral foot wound has closed and there is no tunnel connecting the plantar foot wound to that site. The plantar foot wound still probes quite deeply, however. There is some slough, eschar, and nonviable tissue accumulated in the wound bed. No malodorous drainage present. His MRI was performed yesterday and is consistent with fairly extensive osteomyelitis. 07/29/2021: The patient has an appointment with infectious disease on July 18 to treat his osteomyelitis. The plantar wound still probes quite deeply, approaching bone. The orifice has narrowed quite substantially, however, making it more difficult for him to pack. 08/05/2021: The tunnel connecting the lateral foot wound to the plantar foot wound has reopened. He sees infectious disease tomorrow to discuss long-term antibiotic treatment for osteomyelitis. There was a bit of murky drainage in the wound, but this was noted after he had had topical lidocaine applied so may have just been a blob  of the anesthetic. No odor or frank purulent drainage.  The wound probes deeply at the midfoot approaching bone. He does have MRI results confirming his diagnosis of osteomyelitis. He has failed to progress with conventional treatment and I think his best chance for preservation of the foot is to initiate hyperbaric oxygen therapy. 08/13/2021: The tunnel connecting the 2 wounds has closed again. He has a PICC line and is getting IV daptomycin and cefepime. EKG and chest x-ray are within normal limits. He is scheduled to start hyperbaric oxygen therapy tomorrow. The wound in his midfoot probes deeply, approaching bone. The skin at the orifice continues to heaped up and threatens to close over despite the large cavity within. No erythema, induration, or purulent drainage. The wound on his lateral foot is fairly small and quite clean. 7/[name]/2023: The lateral foot wound has nearly closed. The wound in his midfoot does not probe quite as deeply today. The skin at the orifice continues to try to roll in and obscure the opening. No frankly necrotic tissue appreciated. He is tolerating hyperbaric oxygen therapy. 08/26/2021: The lateral foot wound has closed completely. The wound in his midfoot is shallower again today. The wound orifice is contracting. We are using gentamicin and silver alginate. He continues on IV daptomycin and cefepime and is tolerating hyperbaric oxygen therapy without difficulty. [name], Jahad Hanover ([id number]) [id number] Page 2 of 10 09/02/2021: His foot is a little bit sore today but he was up walking on it all weekend doing back to school shopping with his daughter. The tunneling is down to about 3 cm and I do not appreciate bone at the tip of the probe. The wound is clean but has some callus creating an overhanging lip at the distal aspect. He continues to receive hyperbaric oxygen therapy as well as IV daptomycin and cefepime. 09/09/2021: His wound continues to  improve. The tunnel has come in by 0.9 cm. He continues to form callus around the orifice of the wound. He is tolerating hyperbaric oxygen therapy along with his IV antibiotics. 09/16/2021: The tunneling is down to just half a centimeter. He continues to build up callus around the orifice of the wound. He completed his course of IV antibiotics and his PICC line is scheduled to be removed this Friday. He is tolerating hyperbaric oxygen without difficulty. We have been applying topical gentamicin and silver alginate to his wound. 09/24/2021: The wound depth has come in even further. There is a little bit of callus buildup around the orifice. The opening is quite narrow, at this point. 09/30/2021: The wound depth is about the same this week. The opening continues to contract with callus accumulation. There is some discoloration of the skin on the lateral part of his foot and he admitted to walking more than usual this weekend and also wearing a different pair shoes. He continues to tolerate hyperbaric oxygen therapy without difficulty. 10/07/2021: The wound depth has contracted a bit and is now approximately 1 to 1.5 cm. The orifice of the wound continues to try and close in over the space. There is just some callus and skin around the margins obscuring the orifice. 10/14/2021: The wound depth has come in a little bit more. The orifice of the wound is rolling inwards with epithelium and callus, making it difficult to pack the wound. 10/21/2021: There is still 1 portion of the wound, at the lateral aspect, that is still a couple of centimeters deep. The architecture of the wound makes this somewhat challenging to access and pack. Everything else looks clean. He has his  usual accumulation of callus and skin, narrowing the orifice. 10/29/2021: No significant change to the depth of the wound. The orifice continues to contract with callus and skin narrowing the opening. His wife has been packing the wound and doing  an excellent job. 11/05/2021: The depth of the wound has come in by about a centimeter. There is less callus accumulation around the orificedespite this, the wound is narrower today. We are still awaiting insurance approval for Kerecis powder. 11/12/2021: The depth of the wound continues to contract. He has accumulated some callus around the orifice, as usual. We have received a verbal confirmation that he is approved for Kerecis, but no formal paperwork has yet been received. 10/[name]/2023: The depth continues to come in. There is callus accumulation around the orifice, as usual. He has been formally approved for Kerecis, but unfortunately we do not have a billing mechanism for the powder in [employer] yet. We are working to address this so we can begin treatment. 11/25/2021: Continued filling in of the depth of the wound. Continued buildup of callus around the orifice. No concern for infection. As we do not have a way to bill for Kerecis powder, but are capable of billing for the [facility] sheet, we are going to use that on him instead. 12/03/2021: The depth of the wound continues to fill in. As usual, he has accumulated callus and skin around the orifice of the wound. No concern for infection. He will complete his hyperbaric oxygen therapy this week. 12/17/2021: The wound depth continues to contract. There is callus and skin accumulation around the orifice of the wound, as per usual. There is more epithelium on the actual wound surface. Unfortunately, his Leroy Kennedy was not ordered. 12/24/2021: The wound depth has come in a little bit more this week. There is less skin accumulation around the orifice, but still some callus. On questioning, he admits to spending a fair amount of time on his feet. 12/12; this is a very difficult wound to assess size slitlike wound with some depth but minimal opening. I applied Kerecis No. 4 12/18; Kerecis No. 5 01/15/2022: His wound measured deeper today. There is also some  evidence of pressure induced tissue injury around the lateral aspect of his foot. The patient reports that he was wearing different shoes than usual and felt like his foot was rolling a lot over the weekend. There is heavy callus around the orifice of the wound. No concern for infection. 01/21/2022: The depth decreased today from 1.7 to 1.0 cm. Significantly less tissue damage as compared to last week. Still with callus and skin accumulation around the orifice. 01/29/2022: The wound continues to contract and the depth is coming in further. We are still working on getting [employer] for him. 02/04/2022: No real change this week. Still working on the [employer]. 02/11/2022: No significant changes again. We are still working on the [employer]. 02/18/2022: No change to the wound. We have submitted in IVR for [place]. 02/25/2022: Unfortunately, his wound has deteriorated over the past week. It is deeper and is approaching bone once again. There is more slough accumulation and the tissue shows signs of pressure injury. He says that he has been trying to wear his new work boots, which are waterproof. He says they make his feet sweat excessively. I think our main challenge with him is that we just are not able to adequately offload him while he is still working and on his feet through much of the day. He is willing  to consider total contact casting, but will need to make some arrangements with work. On a more positive note, however, he has been approved for [employer]. 03/03/2022: The wound probed a little bit deeper again today. We are planning to put him in a total contact cast as soon as he has cleared it with work to take some time off. 03/10/2022: The wound was a little bit shallower today and the overall appearance is significantly improved. We are planning to put him in a cast today. 03/12/2022: He is here for his first total contact cast change. He denies any issues with pressure, rubbing, or  pain. 03/17/2022: For some reason, the wound probes deeper today. There is some slough and nonviable subcutaneous tissue around the opening. He denies any difficulty with the total contact cast. 03/24/2022: The wound is shallower today. There is better granulation tissue visible on the wound surface. 03/31/2022: His cast protector got a hole in it and his cast got wet. His foot is macerated. The wound measured a little deeper today. There is some hypertrophic granulation tissue at the orifice of his wound. 3/18; patient presents for follow-up. He had no issues with the total contact cast. We have been using epi cord to the wound bed. Overall wound is stable. Patient denies signs of infection. [name], Samul [place] ([id number]) [id number] Page 3 of 10 04/14/2022: The depth of the wound has rather remarkably decreased to 1.3 cm today. The orifice of the wound has also contracted quite a bit. 04/21/2022: The depth of the wound has decreased even further. The orifice is also smaller. The wound is very clean. 04/28/2022: The depth of the wound is down to 0.5 cm. The orifice continues to contract. 05/06/2022: The primary wound is substantially smaller and shallower today. Unfortunately, he developed a blister distal to this on the plantar surface of his foot. The tissue breakdown underneath the blister does extend into the fat layer in 1 area over a bony prominence from his collapsed arch. The remainder of the tissue underneath the blister is intact skin. 05/12/2022: The primary wound is down to just a couple of millimeters and is flush with the surrounding skin. The area that had blistered last week had some purulent drainage coming follow-up a corner that was underneath some dry skin. No malodor associated with this. There is some additional slough that has accumulated. 05/19/2022: His primary wound has actually healed. The area that opened after a blister formed is smaller. There is a little  bit of thickened skin around the edges, no concern for infection. 05/26/2022: The wound is stable. Unfortunately, for some reason the primary dressing was left off and there was no silver alginate applied. There was also no zinc oxide applied to the periwound so there is some tissue maceration present due to the fact that the only thing used on the wound was a foam donut, which retains the moisture from his foot. Electronic Signature(s) Signed: 05/26/2022 11:09:34 AM By: Duanne Guess MD FACS Entered By: Duanne Guess on 05/26/2022 11:09:34 -------------------------------------------------------------------------------- Physical Exam Details Patient Name: Date of Service: [name], [name] 05/26/2022 10:00 A M Medical Record Number: [id number] Patient Account Number: [id number] Date of Birth/Sex: Treating RN: [birth year][name] (52 y.o. M) Primary Care Provider: Tana Conch Other Clinician: Referring Provider: Treating Provider/Extender: Arvin Collard in Treatment: 46 Constitutional Hypertensive, asymptomatic. . . . no acute distress. Respiratory Normal work of breathing on room air. Notes 05/26/2022: The wound is stable. There is some tissue maceration  present. Electronic Signature(s) Signed: 05/26/2022 11:10:55 AM By: Duanne Guess MD FACS Entered By: Duanne Guess on 05/26/2022 11:10:55 -------------------------------------------------------------------------------- Physician Orders Details Patient Name: Date of Service: [name], [name] 05/26/2022 10:00 A M Medical Record Number: [id number] Patient Account Number: [id number] Date of Birth/Sex: Treating RN: [birth year] (52 y.o. 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 - Dr Lady Gary RM 2 Return A [name], Varnell B ([id number]) [id number] Page 4 of 10 Anesthetic (In clinic) Topical Lidocaine 4% applied to wound bed Bathing/ Shower/ Hygiene May shower with protection but do not get wound dressing(s) wet. Protect dressing(s) with water repellant cover (for example, large plastic bag) or a cast cover and may then take shower. Off-Loading Total Contact Cast to Right Lower Extremity Other: - Try to not stand on your feet too much, minimize walking Wound Treatment Wound #4 - Foot Wound Laterality: Right, Lateral, Distal Cleanser: Soap and Water 1 x Per Week/30 Days Discharge Instructions: May shower and wash wound with dial antibacterial soap and water prior to dressing change. Cleanser: Wound Cleanser (Generic) 1 x Per Week/30 Days Discharge Instructions: Cleanse the wound with wound cleanser prior to applying a clean dressing using gauze sponges, not tissue or cotton balls. Topical: Gentamicin 1 x Per Week/30 Days Discharge Instructions: As directed by physician Prim Dressing: Maxorb Extra Ag+ Alginate Dressing, 2x2 (in/in) 1 x Per Week/30 Days ary Discharge Instructions: Apply to wound bed as instructed Secondary Dressing: Optifoam Non-Adhesive Dressing, 4x4 in 1 x Per Week/30 Days Discharge Instructions: Apply over primary dressing as directed. Secondary Dressing: Woven Gauze Sponge, Non-Sterile 4x4 in 1 x Per Week/30 Days Discharge Instructions: Apply over primary dressing as directed. Secondary Dressing: Zetuvit Plus 4x8 in 1 x Per Week/30 Days Discharge Instructions: Apply over primary dressing as directed. Secured With: Paper Tape, 2x10 (in/yd) (Generic) 1 x Per Week/30  Days Discharge Instructions: Secure dressing with tape as directed. Compression Wrap: Kerlix Roll 4.5x3.1 (in/yd) (Generic) 1 x Per Week/30 Days Discharge Instructions: Apply Kerlix and Coban compression as directed. Patient Medications llergies: No Known Allergies A Notifications Medication Indication Start End 05/26/2022 lidocaine DOSE topical 4 % cream - cream topical Electronic Signature(s) Signed: 05/26/2022 11:24:09 AM By: Duanne Guess MD FACS Entered By: Duanne Guess on 05/26/2022 11:12:04 -------------------------------------------------------------------------------- Problem List Details Patient Name: Date of Service: [name], [name] 05/26/2022 10:00 A M Medical Record Number: [id number] Patient Account Number: [id number] Date of Birth/Sex: Treating RN: [name]/[birth year] (52 y.o. 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 05/26/2022 10:00 A M Medical Record  Number: [id number] Patient Account Number: [id number] Date of Birth/Sex: Treating RN: [name], [name] (52 y.o. 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 The most recent amputation was in [name] 2022. At that operation, antibiotic beads were placed in the wound. He has been managed by podiatry for his procedures and management of his wounds. He has been in a Scientist, water quality. He is on oral doxycycline. They have been using Betadine wet to dry dressings along with Iodosorb. The patient states that when he thinks the wound is getting too dry, he applies topical Neosporin. At his last visit, on June 7 of this year, the podiatrist determined that he felt the wound was stalled and referred him to wound care for additional evaluation and management. An MRI has been ordered, but not yet scheduled or performed. Pathology from his operation in [name] 2022 demonstrated findings consistent with chronic osteomyelitis. Today, there is a large irregular wound on the plantar surface of his right foot, at about the level of the fifth metatarsal head. This tracks through to a pinpoint opening on the dorsal lateral portion of his foot. The intake nurse reported purulent drainage. There is some malodor from the wound. No frank necrosis identified. 07/11/2021: Today, the wounds do not connect. I attempted multiple times from various directions and the shared tunnel is no longer open. He has some slough accumulation on the dorsal part of his foot as  well as slough and callus buildup on the plantar surface. His MRI is scheduled for June 29. No purulent drainage or malodor appreciated today. 07/19/2021: The lateral foot wound has closed and there is no tunnel connecting the plantar foot wound to that site. The plantar foot wound still probes quite deeply, however. There is some slough, eschar, and nonviable tissue accumulated in the wound bed. No malodorous drainage present. His MRI was performed yesterday and is consistent with fairly extensive osteomyelitis. 07/29/2021: The patient has an appointment with infectious disease on July 18 to treat his osteomyelitis. The plantar wound still probes quite deeply, approaching bone. The orifice has narrowed quite substantially, however, making it more difficult for him to pack. 08/05/2021: The tunnel connecting the lateral foot wound to the plantar foot wound has reopened. He sees infectious disease tomorrow to discuss long-term antibiotic treatment for osteomyelitis. There was a bit of murky drainage in the wound, but this was noted after he had had topical lidocaine applied so may have just been a blob of the anesthetic. No odor or frank purulent drainage. The wound probes deeply at the midfoot approaching bone. He does have MRI results confirming his diagnosis of osteomyelitis. He has failed to progress with conventional treatment and I think his best chance for preservation of the foot is to initiate hyperbaric oxygen therapy. 08/13/2021: The tunnel connecting the 2 wounds has closed again. He has a PICC line and is getting IV daptomycin and cefepime. EKG and chest x-ray are within normal  limits. He is scheduled to start hyperbaric oxygen therapy tomorrow. The wound in his midfoot probes deeply, approaching bone. The skin at the orifice continues to heaped up and threatens to close over despite the large cavity within. No erythema, induration, or purulent drainage. The wound on his lateral foot is fairly  small and quite clean. 7/[name]/2023: The lateral foot wound has nearly closed. The wound in his midfoot does not probe quite as deeply today. The skin at the orifice continues to try to roll in and obscure the opening. No frankly necrotic tissue appreciated. He is tolerating hyperbaric oxygen therapy. [name], Harvis [place] ([id number]) [id number] Page 6 of 10 08/26/2021: The lateral foot wound has closed completely. The wound in his midfoot is shallower again today. The wound orifice is contracting. We are using gentamicin and silver alginate. He continues on IV daptomycin and cefepime and is tolerating hyperbaric oxygen therapy without difficulty. 09/02/2021: His foot is a little bit sore today but he was up walking on it all weekend doing back to school shopping with his daughter. The tunneling is down to about 3 cm and I do not appreciate bone at the tip of the probe. The wound is clean but has some callus creating an overhanging lip at the distal aspect. He continues to receive hyperbaric oxygen therapy as well as IV daptomycin and cefepime. 09/09/2021: His wound continues to improve. The tunnel has come in by 0.9 cm. He continues to form callus around the orifice of the wound. He is tolerating hyperbaric oxygen therapy along with his IV antibiotics. 09/16/2021: The tunneling is down to just half a centimeter. He continues to build up callus around the orifice of the wound. He completed his course of IV antibiotics and his PICC line is scheduled to be removed this Friday. He is tolerating hyperbaric oxygen without difficulty. We have been applying topical gentamicin and silver alginate to his wound. 09/24/2021: The wound depth has come in even further. There is a little bit of callus buildup around the orifice. The opening is quite narrow, at this point. 09/30/2021: The wound depth is about the same this week. The opening continues to contract with callus accumulation. There is some  discoloration of the skin on the lateral part of his foot and he admitted to walking more than usual this weekend and also wearing a different pair shoes. He continues to tolerate hyperbaric oxygen therapy without difficulty. 10/07/2021: The wound depth has contracted a bit and is now approximately 1 to 1.5 cm. The orifice of the wound continues to try and close in over the space. There is just some callus and skin around the margins obscuring the orifice. 10/14/2021: The wound depth has come in a little bit more. The orifice of the wound is rolling inwards with epithelium and callus, making it difficult to pack the wound. 10/21/2021: There is still 1 portion of the wound, at the lateral aspect, that is still a couple of centimeters deep. The architecture of the wound makes this somewhat challenging to access and pack. Everything else looks clean. He has his usual accumulation of callus and skin, narrowing the orifice. 10/29/2021: No significant change to the depth of the wound. The orifice continues to contract with callus and skin narrowing the opening. His wife has been packing the wound and doing an excellent job. 11/05/2021: The depth of the wound has come in by about a centimeter. There is less callus accumulation around the orificeoodespite this, the wound is narrower today. We are  still awaiting insurance approval for Kerecis powder. 11/12/2021: The depth of the wound continues to contract. He has accumulated some callus around the orifice, as usual. We have received a verbal confirmation that he is approved for Kerecis, but no formal paperwork has yet been received. 10/[name]/2023: The depth continues to come in. There is callus accumulation around the orifice, as usual. He has been formally approved for Kerecis, but unfortunately we do not have a billing mechanism for the powder in [facility] yet. We are working to address this so we can begin treatment. 11/25/2021: Continued filling in of the depth  of the wound. Continued buildup of callus around the orifice. No concern for infection. As we do not have a way to bill for Kerecis powder, but are capable of billing for the [facility] sheet, we are going to use that on him instead. 12/03/2021: The depth of the wound continues to fill in. As usual, he has accumulated callus and skin around the orifice of the wound. No concern for infection. He will complete his hyperbaric oxygen therapy this week. 12/17/2021: The wound depth continues to contract. There is callus and skin accumulation around the orifice of the wound, as per usual. There is more epithelium on the actual wound surface. Unfortunately, his Darlen Round was not ordered. 12/24/2021: The wound depth has come in a little bit more this week. There is less skin accumulation around the orifice, but still some callus. On questioning, he admits to spending a fair amount of time on his feet. 12/12; this is a very difficult wound to assess size slitlike wound with some depth but minimal opening. I applied Kerecis No. 4 12/18; Kerecis No. 5 01/15/2022: His wound measured deeper today. There is also some evidence of pressure induced tissue injury around the lateral aspect of his foot. The patient reports that he was wearing different shoes than usual and felt like his foot was rolling a lot over the weekend. There is heavy callus around the orifice of the wound. No concern for infection. 01/21/2022: The depth decreased today from 1.7 to 1.0 cm. Significantly less tissue damage as compared to last week. Still with callus and skin accumulation around the orifice. 01/29/2022: The wound continues to contract and the depth is coming in further. We are still working on getting [employer] for him. 02/04/2022: No real change this week. Still working on the [employer]. 02/11/2022: No significant changes again. We are still working on the [employer]. 02/18/2022: No change to the wound. We have submitted in  IVR for [employer]. 02/25/2022: Unfortunately, his wound has deteriorated over the past week. It is deeper and is approaching bone once again. There is more slough accumulation and the tissue shows signs of pressure injury. He says that he has been trying to wear his new work boots, which are waterproof. He says they make his feet sweat excessively. I think our main challenge with him is that we just are not able to adequately offload him while he is still working and on his feet through much of the day. He is willing to consider total contact casting, but will need to make some arrangements with work. On a more positive note, however, he has been approved for [employer]. 03/03/2022: The wound probed a little bit deeper again today. We are planning to put him in a total contact cast as soon as he has cleared it with work to take some time off. 03/10/2022: The wound was a little bit shallower today and the  overall appearance is significantly improved. We are planning to put him in a cast today. 03/12/2022: He is here for his first total contact cast change. He denies any issues with pressure, rubbing, or pain. 03/17/2022: For some reason, the wound probes deeper today. There is some slough and nonviable subcutaneous tissue around the opening. He denies any difficulty with the total contact cast. 03/24/2022: The wound is shallower today. There is better granulation tissue visible on the wound surface. 03/31/2022: His cast protector got a hole in it and his cast got wet. His foot is macerated. The wound measured a little deeper today. There is some hypertrophic [name], Lavel M ([id number]) [id number] Page 7 of 10 granulation tissue at the orifice of his wound. 3/18; patient presents for follow-up. He had no issues with the total contact cast. We have been using epi cord to the wound bed. Overall wound is stable. Patient denies signs of infection. 04/14/2022: The depth of the wound has  rather remarkably decreased to 1.3 cm today. The orifice of the wound has also contracted quite a bit. 04/21/2022: The depth of the wound has decreased even further. The orifice is also smaller. The wound is very clean. 04/28/2022: The depth of the wound is down to 0.5 cm. The orifice continues to contract. 05/06/2022: The primary wound is substantially smaller and shallower today. Unfortunately, he developed a blister distal to this on the plantar surface of his foot. The tissue breakdown underneath the blister does extend into the fat layer in 1 area over a bony prominence from his collapsed arch. The remainder of the tissue underneath the blister is intact skin. 05/12/2022: The primary wound is down to just a couple of millimeters and is flush with the surrounding skin. The area that had blistered last week had some purulent drainage coming follow-up a corner that was underneath some dry skin. No malodor associated with this. There is some additional slough that has accumulated. 05/19/2022: His primary wound has actually healed. The area that opened after a blister formed is smaller. There is a little bit of thickened skin around the edges, no concern for infection. 05/26/2022: The wound is stable. Unfortunately, for some reason the primary dressing was left off and there was no silver alginate applied. There was also no zinc oxide applied to the periwound so there is some tissue maceration present due to the fact that the only thing used on the wound was a foam donut, which retains the moisture from his foot. Patient History Information obtained from Patient. Social History Never smoker, Marital Status - Married, Alcohol Use - Never, Drug Use - No History, Caffeine Use - Daily - T coffee. ea; Medical History Endocrine Patient has history of Type II Diabetes Hospitalization/Surgery History - Amuptation of 3rd,4th and 5th toes of Right foot;Oral Surgery;Anal Fissure surgery;  Cholecystectomy. Objective Constitutional Hypertensive, asymptomatic. no acute distress. Vitals Time Taken: 10:01 AM, Height: 72 in, Weight: 312 lbs, BMI: 42.3, Temperature: 98.2 F, Pulse: 81 bpm, Respiratory Rate: 16 breaths/min, Blood Pressure: 153/92 mmHg, Capillary Blood Glucose: 143 mg/dl. Respiratory Normal work of breathing on room air. General Notes: 05/26/2022: The wound is stable. There is some tissue maceration present. Integumentary (Hair, Skin) Wound #4 status is Open. Original cause of wound was Blister. The date acquired was: 05/06/2022. The wound has been in treatment 2 weeks. The wound is located on the Right,Distal,Lateral Foot. The wound measures 1.2cm length x 1.3cm width x 0.1cm depth; 1.225cm^2 area and 0.123cm^3 volume. There is Fat Layer (  Subcutaneous Tissue) exposed. There is no tunneling or undermining noted. There is a medium amount of serosanguineous drainage noted. The wound margin is distinct with the outline attached to the wound base. There is medium (34-66%) red granulation within the wound bed. There is a medium (34-66%) amount of necrotic tissue within the wound bed including Adherent Slough. The periwound skin appearance had no abnormalities noted for color. The periwound skin appearance exhibited: Callus, Dry/Scaly. Periwound temperature was noted as No Abnormality. 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 There was a T Contact otal Cast Procedure by Duanne Guess, MD. Post procedure Diagnosis Wound #4: Same as Pre-Procedure Notes:  scribed for Dr. Lady Gary by [name], RN. Plan Follow-up Appointments: Return Appointment in 1 week. - Dr Lady Gary RM 2 Anesthetic: (In clinic) Topical Lidocaine 4% applied to wound bed Bathing/ Shower/ Hygiene: May shower with protection but do not get wound dressing(s) wet. Protect dressing(s) with water repellant cover (for example, large plastic bag) or a cast cover and may then take shower. Off-Loading: T Contact Cast to Right Lower Extremity otal Other: - Try to not stand on your feet too much, minimize walking The following medication(s) was prescribed: lidocaine topical 4 % cream cream topical was prescribed at facility WOUND #4: - Foot Wound Laterality: Right, Lateral, Distal Cleanser: Soap and Water 1 x Per Week/30 Days Discharge Instructions: May shower and wash wound with dial antibacterial soap and water prior to dressing change. Cleanser: Wound Cleanser (Generic) 1 x Per Week/30 Days Discharge Instructions: Cleanse the wound with wound cleanser prior to applying a clean dressing using gauze sponges, not tissue or cotton balls. Topical: Gentamicin 1 x Per Week/30 Days Discharge Instructions: As directed by physician Prim Dressing: Maxorb Extra Ag+ Alginate Dressing, 2x2 (in/in) 1 x Per Week/30 Days ary Discharge Instructions: Apply to wound bed as instructed Secondary Dressing: Optifoam Non-Adhesive Dressing, 4x4 in 1 x Per Week/30 Days Discharge Instructions: Apply over primary dressing as directed. Secondary Dressing: Woven Gauze Sponge, Non-Sterile 4x4 in 1 x Per Week/30 Days Discharge Instructions: Apply over primary dressing as directed. Secondary Dressing: Zetuvit Plus 4x8 in 1 x Per Week/30 Days Discharge Instructions: Apply over primary dressing as directed. Secured With: Paper T ape, 2x10 (in/yd) (Generic) 1 x Per Week/30 Days Discharge Instructions: Secure dressing with tape as directed. Com pression Wrap: Kerlix Roll 4.5x3.1 (in/yd) (Generic) 1 x Per  Week/30 Days Discharge Instructions: Apply Kerlix and Coban compression as directed. 05/26/2022: The wound is stable. Unfortunately, for some reason the primary dressing was left off and there was no silver alginate applied. There was also no zinc oxide applied to the periwound so there is some tissue maceration present. The wound bed was prepared for total contact casting. T opical gentamicin and silver alginate were laid down and zinc oxide was applied to the periwound. A total contact cast was then applied in standard fashion. He will follow-up in 1 week. Electronic Signature(s) Signed: 05/26/2022 11:12:52 AM By: Duanne Guess MD FACS Entered By: Duanne Guess on 05/26/2022 11:12:52 -------------------------------------------------------------------------------- HxROS Details Patient Name: Date of Service: [name], [name] 05/26/2022 10:00 A M Medical Record Number: [id number] Patient Account Number:  [id number] Date of Birth/Sex: Treating RN: [birth year] (52 y.o. M) Primary Care Provider: Tana Conch Other Clinician: Referring Provider: Treating Provider/Extender: Arvin Collard in Treatment: 46 Information Obtained From Patient Endocrine Medical History: Positive for: Type II Diabetes [name], Ritesh M ([id number]) [id number] Page 9 of 10 Immunizations Pneumococcal Vaccine: Received Pneumococcal Vaccination: Yes Received Pneumococcal Vaccination On or After 60th Birthday: No Implantable Devices None Hospitalization / Surgery History Type of Hospitalization/Surgery Amuptation of 3rd,4th and 5th toes of Right foot;Oral Surgery;Anal Fissure surgery; Cholecystectomy Family and Social History Never smoker; Marital Status - Married; Alcohol Use: Never; Drug Use: No History; Caffeine Use: Daily - T coffee; Financial Concerns: No; Food, ea; Clothing or Shelter Needs: No; Support System Lacking: No; Transportation Concerns:  No Electronic Signature(s) Signed: 05/26/2022 11:24:09 AM By: Duanne Guess MD FACS Entered By: Duanne Guess on 05/26/2022 11:09:40 -------------------------------------------------------------------------------- Total Contact Cast Details Patient Name: Date of Service: [name], [name] 05/26/2022 10:00 A M Medical Record Number: [id number] Patient Account Number: [id number] Date of Birth/Sex: Treating RN: [birth year] [name] (52 y.o. [name] Primary Care Provider: Tana Conch Other Clinician: Referring Provider: Treating Provider/Extender: Arvin Collard in Treatment: 46 T Contact Cast Applied for Wound Assessment: otal Wound #4 Right,Distal,Lateral Foot Performed By: Physician Duanne Guess, MD Post Procedure Diagnosis Same as Pre-procedure Notes scribed for Dr. Lady Gary by [name], RN Electronic Signature(s) Signed: 05/26/2022 11:24:09 AM By: Duanne Guess MD FACS Signed: 05/26/2022 4:00:29 PM By: [name] Entered By: [name] on 05/26/2022 10:17:06 -------------------------------------------------------------------------------- SuperBill Details Patient Name: Date of Service: [name], [name] 05/26/2022 Medical Record Number: [id number] Patient Account Number: [id number] Date of Birth/Sex: Treating RN: [birth year], [name] (52 y.o. M) Primary Care Provider: Tana Conch Other Clinician: Referring Provider: Treating Provider/Extender: Arvin Collard in Treatment: 46 Diagnosis Coding ICD-10 Codes Code Description [phone] Non-pressure chronic ulcer of other part of right foot with fat layer exposed E11.49 Type 2 diabetes mellitus with other diabetic neurological complication M86.671 Other chronic osteomyelitis, right ankle and foot Facility Procedures : [name], E CPT4 Code: [id number] DWARD M ([id number] Description: [phone] - APPLY TOTAL CONTACT LEG CAST ICD-10 Diagnosis Description L97.512  Non-pressure chronic ulcer of other part of right foot with fat layer exposed ) [phone] Modifier: [email or web address] Quantity: 1 27.pdf Page 10 of 10 Physician Procedures : CPT4 Code Description Modifier [id number] - WC PHYS LEVEL 4 - EST PT ICD-10 Diagnosis Description L97.512 Non-pressure chronic ulcer of other part of right foot with fat layer exposed E11.49 Type 2 diabetes mellitus with other diabetic neurological  complication Quantity: 1 : [phone] [id number] - WC PHYS APPLY TOTAL CONTACT CAST ICD-10 Diagnosis Description L97.512 Non-pressure chronic ulcer of other part of right foot with fat layer exposed Quantity: 1 Electronic Signature(s) Signed: 05/26/2022 11:13:11 AM By: Duanne Guess MD FACS Entered By: Duanne Guess on 05/26/2022 11:13:10

## 2022-05-26 NOTE — Progress Notes (Signed)
[name], [name] ([id number]) [id number] Page 1 of 6 Visit Report for 05/26/2022 Arrival Information Details Patient Name: Date of Service: [place], [name] 05/26/2022 10:00 [name] Medical Record Number: [id number] Patient Account Number: [id number] Date of Birth/Sex: Treating RN: 02-06-1970 (52 y.o. [name] Primary Care Kamie Korber: Tana Conch Other Clinician: Referring Namon Villarin: Treating Channin Agustin/Extender: Arvin Collard in Treatment: 90 Visit Information History Since Last Visit Added or deleted any medications: No Patient Arrived: Ambulatory Any new allergies or adverse reactions: No Arrival Time: 10:01 Had a fall or experienced change in No Accompanied By: self activities of daily living that may affect Transfer Assistance: None risk of falls: Patient Identification Verified: Yes Signs or symptoms of abuse/neglect since last visito No Secondary Verification Process Completed: Yes Hospitalized since last visit: No Patient Requires Transmission-Based Precautions: No Implantable device outside of the clinic excluding No Patient Has Alerts: Yes cellular tissue based products placed in the center since last visit: Has Dressing in Place as Prescribed: Yes Has Footwear/Offloading in Place as Prescribed: Yes Right: T Contact Cast otal Pain Present Now: No Electronic Signature(s) Signed: 05/26/2022 4:00:29 PM By: [name] Entered By: [name] on 05/26/2022 10:01:29 -------------------------------------------------------------------------------- Encounter Discharge Information Details Patient Name: Date of Service: [name] 05/26/2022 10:00 [name] Medical Record Number: [id number] Patient Account Number: [id number] Date of Birth/Sex: Treating RN: 06-25-1970 (52 y.o. [name] Primary Care Arleth Mccullar: Tana Conch Other Clinician: Referring Rakwon Letourneau: Treating Eldrick Penick/Extender:  Arvin Collard in Treatment: 102 Encounter Discharge Information Items Discharge Condition: Stable Ambulatory Status: Ambulatory Discharge Destination: Home Transportation: Private Auto Accompanied By: self Schedule Follow-up Appointment: Yes Clinical Summary of Care: Patient Declined Electronic Signature(s) Signed: 05/26/2022 4:00:29 PM By: [name] Entered By: [name] on 05/26/2022 11:49:51 -------------------------------------------------------------------------------- Lower Extremity Assessment Details Patient Name: Date of Service: [name] 05/26/2022 10:00 [name] Medical Record Number: [id number] Patient Account Number: [id number] Date of Birth/Sex: Treating RN: 04-22-1970 (52 y.o. [name] Primary Care Alethea Terhaar: Tana Conch Other Clinician: Referring Raela Bohl: Treating Missi Mcmackin/Extender: Arvin Collard in Treatment: 46 Edema Assessment Left: [Left: Right] Franne Forts: :] Assessed: [Left: No] [Right: No] Edema: [Left: N] [Right: o] Calf Left: Right: Point of Measurement: From Medial Instep 42 cm Ankle Left: Right: Point of Measurement: From Medial Instep 25.2 cm Vascular Assessment Pulses: Dorsalis Pedis Palpable: [Right:Yes] Electronic Signature(s) Signed: 05/26/2022 4:00:29 PM By: [name] Entered By: [name] on 05/26/2022 10:11:11 -------------------------------------------------------------------------------- Multi Wound Chart Details Patient Name: Date of Service: [name] 05/26/2022 10:00 [name] Medical Record Number: [id number] Patient Account Number: [id number] Date of Birth/Sex: Treating RN: [birth year] (52 y.o. [name] Primary Care Romon Devereux: Tana Conch Other Clinician: Referring Katherene Dinino: Treating Atarah Cadogan/Extender: Arvin Collard in Treatment: 46 Vital Signs Height(in): 72 Capillary Blood Glucose(mg/dl):  643 Weight(lbs): 329 Pulse(bpm): 81 Body Mass Index(BMI): 42.3 Blood Pressure(mmHg): 153/92 Temperature(F): 98.2 Respiratory Rate(breaths/min): 16 [4:Photos:] [N/A:N/A] Right, Distal, Lateral Foot N/A N/A Wound Location: Blister N/A N/A Wounding Event: Diabetic Wound/Ulcer of the Lower N/A N/A Primary Etiology: Extremity Type II Diabetes N/A N/A Comorbid History: 05/06/2022 N/A N/A Date Acquired: 2 N/A N/A Weeks of Treatment: Open N/A N/A Wound Status: No N/A N/A Wound Recurrence: 1.2x1.3x0.1 N/A N/A Measurements L x W x D (cm) 1.225 N/A N/A A (cm) : rea 0.123 N/A N/A Volume (cm) : 22.00% N/A N/A % Reduction in A rea: 21.70% N/A N/A % Reduction in Volume:  Grade 1 N/A N/A Classification: Medium N/A N/A Exudate A mount: Serosanguineous N/A N/A Exudate Type: red, brown N/A N/A Exudate Color: Distinct, outline attached N/A N/A Wound Margin: Medium (34-66%) N/A N/A Granulation A mount: Red N/A N/A Granulation Quality: Medium (34-66%) N/A N/A Necrotic A mount: Fat Layer (Subcutaneous Tissue): Yes N/A N/A Exposed Structures: [name], [name] ([id number]) [id number] Page 3 of 6 Fascia: No Tendon: No Muscle: No Joint: No Bone: No Small (1-33%) N/A N/A Epithelialization: Callus: Yes N/A N/A Periwound Skin Texture: Dry/Scaly: Yes N/A N/A Periwound Skin Moisture: No Abnormalities Noted N/A N/A Periwound Skin Color: No Abnormality N/A N/A Temperature: T Contact Cast otal N/A N/A Procedures Performed: Treatment Notes Electronic Signature(s) Signed: 05/26/2022 11:08:18 AM By: Duanne Guess MD FACS Entered By: Duanne Guess on 05/26/2022 11:08:18 -------------------------------------------------------------------------------- Multi-Disciplinary Care Plan Details Patient Name: Date of Service: [name] [name] 05/26/2022 10:00 [name] Medical Record Number: [id number] Patient Account Number: [id number] Date of Birth/Sex:  Treating RN: February 05, 1970 (52 y.o. [name] Primary Care Ezriel Boffa: Tana Conch Other Clinician: Referring Talley Kreiser: Treating Youa Deloney/Extender: Arvin Collard in Treatment: 46 Multidisciplinary Care Plan reviewed with physician Active Inactive Nutrition Nursing Diagnoses: Impaired glucose control: actual or potential Potential for alteratiion in Nutrition/Potential for imbalanced nutrition Goals: Patient/caregiver will maintain therapeutic glucose control Date Initiated: 07/29/2021 Target Resolution Date: 06/13/2022 Goal Status: Active Interventions: Assess patient nutrition upon admission and as needed per policy Provide education on elevated blood sugars and impact on wound healing Treatment Activities: Patient referred to Primary Care Physician for further nutritional evaluation : 07/29/2021 Notes: Wound/Skin Impairment Nursing Diagnoses: Impaired tissue integrity Goals: Patient/caregiver will verbalize understanding of skin care regimen Date Initiated: 07/04/2021 Target Resolution Date: 06/13/2022 Goal Status: Active Interventions: Assess ulceration(s) every visit Treatment Activities: Skin care regimen initiated : 07/04/2021 Notes: Electronic Signature(s) Signed: 05/26/2022 4:00:29 PM By: Herrington, Taylor [name], [name] ([id number]) [id number] Page 4 of 6 Entered By: [name] on 05/26/2022 10:20:51 -------------------------------------------------------------------------------- Pain Assessment Details Patient Name: Date of Service[name] 05/26/2022 10:00 [name] Medical Record Number: [id number] Patient Account Number: [id number] Date of Birth/Sex: Treating RN: May 31, 1970 (52 y.o. [name] Primary Care Semir Brill: Tana Conch Other Clinician: Referring Josselyne Onofrio: Treating Georgio Hattabaugh/Extender: Arvin Collard in Treatment: 46 Active Problems Location  of Pain Severity and Description of Pain Patient Has Paino No Site Locations Rate the pain. Current Pain Level: 0 Pain Management and Medication Current Pain Management: Electronic Signature(s) Signed: 05/26/2022 4:00:29 PM By: [name] Entered By: [name] on 05/26/2022 10:01:35 -------------------------------------------------------------------------------- Patient/Caregiver Education Details Patient Name: Date of Service: [name] 5/6/2024andnbsp10:00 [name] Medical Record Number: [id number] Patient Account Number: [id number] Date of Birth/Gender: Treating RN: 14-Jul-1970 (52 y.o. [name] Primary Care Physician: Tana Conch Other Clinician: Referring Physician: Treating Physician/Extender: Arvin Collard in Treatment: 66 Education Assessment Education Provided To: Patient Education Topics Provided Offloading: Methods: Explain/Verbal Responses: Reinforcements needed, State content correctly Electronic Signature(s) Signed: 05/26/2022 4:00:29 PM By: [name] Entered By: [name] on 05/26/2022 11:33:03 [name], [name] ([id number]) [id number] Page 5 of 6 -------------------------------------------------------------------------------- Wound Assessment Details Patient Name: Date of Service[name] 05/26/2022 10:00 [name] Medical Record Number: [id number] Patient Account Number: [id number] Date of Birth/Sex: Treating RN: August 14, 1970 (52 y.o. [name] Primary Care Chaye Misch: Tana Conch Other Clinician: Referring Joline Encalada: Treating Mckensie Scotti/Extender: Arvin Collard in Treatment: 46 Wound Status Wound Number: 4  Primary Etiology: Diabetic Wound/Ulcer of the Lower Extremity Wound Location: Right, Distal, Lateral Foot Wound Status: Open Wounding Event: Blister Comorbid History: Type II Diabetes Date Acquired: 05/06/2022 Weeks  Of Treatment: 2 Clustered Wound: No Photos Wound Measurements Length: (cm) 1.2 Width: (cm) 1.3 Depth: (cm) 0.1 Area: (cm) 1.225 Volume: (cm) 0.123 % Reduction in Area: 22% % Reduction in Volume: 21.7% Epithelialization: Small (1-33%) Tunneling: No Undermining: No Wound Description Classification: Grade 1 Wound Margin: Distinct, outline attached Exudate Amount: Medium Exudate Type: Serosanguineous Exudate Color: red, brown Foul Odor After Cleansing: No Slough/Fibrino Yes Wound Bed Granulation Amount: Medium (34-66%) Exposed Structure Granulation Quality: Red Fascia Exposed: No Necrotic Amount: Medium (34-66%) Fat Layer (Subcutaneous Tissue) Exposed: Yes Necrotic Quality: Adherent Slough Tendon Exposed: No Muscle Exposed: No Joint Exposed: No Bone Exposed: No Periwound Skin Texture Texture Color No Abnormalities Noted: No No Abnormalities Noted: Yes Callus: Yes Temperature / Pain Temperature: No Abnormality Moisture No Abnormalities Noted: No Dry / Scaly: Yes Treatment Notes Wound #4 (Foot) Wound Laterality: Right, Lateral, Distal Cleanser Soap and Water Discharge Instruction: May shower and wash wound with dial antibacterial soap and water prior to dressing change. Wound [address] [name], [name] ([id number]) [id number] Page 6 of 6 Discharge Instruction: Cleanse the wound with wound cleanser prior to applying a clean dressing using gauze sponges, not tissue or cotton balls. Peri-Wound Care Topical Gentamicin Discharge Instruction: As directed by physician Primary Dressing Maxorb Extra Ag+ Alginate Dressing, 2x2 (in/in) Discharge Instruction: Apply to wound bed as instructed Secondary Dressing Optifoam Non-Adhesive Dressing, 4x4 in Discharge Instruction: Apply over primary dressing as directed. Woven Gauze Sponge, Non-Sterile 4x4 in Discharge Instruction: Apply over primary dressing as directed. Zetuvit Plus 4x8 in Discharge  Instruction: Apply over primary dressing as directed. Secured With Paper Tape, 2x10 (in/yd) Discharge Instruction: Secure dressing with tape as directed. Compression Wrap Kerlix Roll 4.5x3.1 (in/yd) Discharge Instruction: Apply Kerlix and Coban compression as directed. Compression Stockings Add-Ons Electronic Signature(s) Signed: 05/26/2022 4:00:29 PM By: [name] Entered By: [name] on 05/26/2022 10:13:13 -------------------------------------------------------------------------------- Vitals Details Patient Name: Date of Service: [name] 05/26/2022 10:00 [name] Medical Record Number: [id number] Patient Account Number: [id number] Date of Birth/Sex: Treating RN: [birth year] (52 y.o. [name] Primary Care Jazmaine Fuelling: Tana Conch Other Clinician: Referring Carinne Brandenburger: Treating Rodney Wigger/Extender: Arvin Collard in Treatment: 46 Vital Signs Time Taken: 10:01 Temperature (F): 98.2 Height (in): 72 Pulse (bpm): 81 Weight (lbs): 312 Respiratory Rate (breaths/min): 16 Body Mass Index (BMI): 42.3 Blood Pressure (mmHg): 153/92 Capillary Blood Glucose (mg/dl): 782 Reference Range: 80 - 120 mg / dl Electronic Signature(s) Signed: 05/26/2022 4:00:29 PM By: [name] Entered By: [name] on 05/26/2022 10:03:13

## 2022-05-30 ENCOUNTER — Other Ambulatory Visit: Payer: Self-pay | Admitting: Family Medicine

## 2022-06-02 ENCOUNTER — Encounter (HOSPITAL_BASED_OUTPATIENT_CLINIC_OR_DEPARTMENT_OTHER): Payer: BLUE CROSS/BLUE SHIELD | Admitting: General Surgery

## 2022-06-02 DIAGNOSIS — E11621 Type 2 diabetes mellitus with foot ulcer: Secondary | ICD-10-CM | POA: Diagnosis not present

## 2022-06-09 ENCOUNTER — Encounter (HOSPITAL_BASED_OUTPATIENT_CLINIC_OR_DEPARTMENT_OTHER): Payer: BLUE CROSS/BLUE SHIELD | Admitting: General Surgery

## 2022-06-09 DIAGNOSIS — E11621 Type 2 diabetes mellitus with foot ulcer: Secondary | ICD-10-CM | POA: Diagnosis not present

## 2022-06-11 ENCOUNTER — Other Ambulatory Visit: Payer: Self-pay | Admitting: Family Medicine

## 2022-06-17 ENCOUNTER — Ambulatory Visit (HOSPITAL_BASED_OUTPATIENT_CLINIC_OR_DEPARTMENT_OTHER): Payer: BLUE CROSS/BLUE SHIELD | Admitting: General Surgery

## 2022-06-18 ENCOUNTER — Encounter (HOSPITAL_BASED_OUTPATIENT_CLINIC_OR_DEPARTMENT_OTHER): Payer: BLUE CROSS/BLUE SHIELD | Admitting: General Surgery

## 2022-06-18 DIAGNOSIS — E11621 Type 2 diabetes mellitus with foot ulcer: Secondary | ICD-10-CM | POA: Diagnosis not present

## 2022-06-23 ENCOUNTER — Encounter (HOSPITAL_BASED_OUTPATIENT_CLINIC_OR_DEPARTMENT_OTHER): Payer: BLUE CROSS/BLUE SHIELD | Attending: General Surgery | Admitting: General Surgery

## 2022-06-23 ENCOUNTER — Telehealth: Payer: Self-pay | Admitting: Family Medicine

## 2022-06-23 DIAGNOSIS — E1169 Type 2 diabetes mellitus with other specified complication: Secondary | ICD-10-CM | POA: Diagnosis not present

## 2022-06-23 DIAGNOSIS — E1149 Type 2 diabetes mellitus with other diabetic neurological complication: Secondary | ICD-10-CM | POA: Diagnosis present

## 2022-06-23 DIAGNOSIS — M86671 Other chronic osteomyelitis, right ankle and foot: Secondary | ICD-10-CM | POA: Insufficient documentation

## 2022-06-23 DIAGNOSIS — Z89421 Acquired absence of other right toe(s): Secondary | ICD-10-CM | POA: Insufficient documentation

## 2022-06-23 DIAGNOSIS — L97512 Non-pressure chronic ulcer of other part of right foot with fat layer exposed: Secondary | ICD-10-CM | POA: Insufficient documentation

## 2022-06-23 MED ORDER — INSULIN PEN NEEDLE 31G X 8 MM MISC: 11 refills | Status: DC

## 2022-06-23 NOTE — Telephone Encounter (Signed)
Caller states patient is running out of Insulin Syringe-Needle U-100 (BD INSULIN SYRINGE U/F) 31G X 5/16" 0.5 ML due to only 100 (30 day supply) being sent in instead of 300 (90 day supply). States during last pick up, pharmacy gave her this refill instead of 31G X Insulin pen needle. Please Advise on further action.   Marland Kitchen.Prescription Request  06/23/2022  LOV: 04/21/2022  What is the name of the medication or equipment? Insulin Syringe-Needle U-100 (BD INSULIN SYRINGE U/F) 31G X 5/16" 0.5 ML   Have you contacted your pharmacy to request a refill? Yes   Which pharmacy would you like this sent to?  [id number] [place], [place] - [address] [place] [address] Phone: [phone] Fax: [phone]    Patient notified that their request is being sent to the clinical staff for review and that they should receive a response within 2 business days.   Please advise at Mobile [phone] (mobile)

## 2022-06-23 NOTE — Telephone Encounter (Signed)
Refill sent to requested pharmacy.

## 2022-06-30 ENCOUNTER — Encounter (HOSPITAL_BASED_OUTPATIENT_CLINIC_OR_DEPARTMENT_OTHER): Payer: BLUE CROSS/BLUE SHIELD | Admitting: General Surgery

## 2022-06-30 DIAGNOSIS — E1149 Type 2 diabetes mellitus with other diabetic neurological complication: Secondary | ICD-10-CM | POA: Diagnosis not present

## 2022-07-04 ENCOUNTER — Ambulatory Visit: Payer: BLUE CROSS/BLUE SHIELD | Admitting: Podiatry

## 2022-07-04 DIAGNOSIS — Q666 Other congenital valgus deformities of feet: Secondary | ICD-10-CM

## 2022-07-04 NOTE — Progress Notes (Signed)
Subjective:  Patient ID: [name], male    DOB: [birth year],  MRN: [id number]  Chief Complaint  Patient presents with   Wound Check    Pt stated that everything is going good he would like to talk about getting new orthotics     52 y.o. male presents with the above complaint.  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  Patient would like to obtain new pair of orthotics as his previous diabetic accommodative orthotics have worn out.  He denies any other acute complaints.  Pain scale 7 out of 10 dull achy in nature   Review of Systems: Negative except as noted in the HPI. Denies N/V/F/Ch.  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   (06-17-2019 pt stated has been stable with no changes and followed by pcp)   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HCC)    11-06-2016  lower extremity angiography (dr Edilia Bo)  mil tibial disease w/ occlusion of the distal peroneal artery and dorsalis pedis artery but has widely patent posterior tibial and anterior tibial arterties   Type 2 diabetes mellitus treated with insulin (HCC)    followed by pcp   (06-17-2019  pt stated checks blood sugar daily in am,  fasting sugar-- average 71)   Vitamin B 12 deficiency     Current Outpatient Medications:    ascorbic acid (VITAMIN C) 500 MG tablet, Take 500 mg by mouth daily., Disp: , Rfl:    aspirin EC 81  MG tablet, Take 81 mg by mouth daily., Disp: , Rfl:    atorvastatin (LIPITOR) 80 MG tablet, Take 1 tablet (80 mg total) by mouth daily., Disp: 90 tablet, Rfl: 3   Cyanocobalamin (VITAMIN B-12) 5000 MCG SUBL, Place 5,000 mcg under the tongue daily., Disp: , Rfl:    fenofibrate (TRICOR) 48 MG tablet, TAKE 1 TABLET BY MOUTH EVERY DAY (Patient taking differently: Take 48 mg by mouth daily.), Disp: 90 tablet, Rfl: 4   glucose blood (ACCU-CHEK GUIDE) test strip, Dx E11.49 - Use to check blood glucose three times daily or as directed., Disp: 300 each, Rfl: 1   hydrochlorothiazide (MICROZIDE) 12.5 MG capsule, TAKE 1 CAPSULE BY MOUTH EVERY DAY IN THE MORNING (Patient taking differently: Take 12.5 mg by mouth daily. TAKE 1 CAPSULE BY MOUTH EVERY DAY IN THE MORNING), Disp: 100 capsule, Rfl: 3   insulin aspart (NOVOLOG FLEXPEN) 100 UNIT/ML FlexPen, INJECT 15-25 UNITS INTO THE SKIN 3 TIMES DAILY WITH MEALS. PLEASE PROVIDE PEN NEEDLES IF POSSIBLE, Disp: 15 mL, Rfl: 11   insulin degludec (TRESIBA FLEXTOUCH) 200 UNIT/ML FlexTouch Pen, Inject 80-100 Units into the skin daily., Disp: 6 mL, Rfl: 5   Insulin Pen Needle 31G X 8 MM MISC, Use to inject insulin daily. Dx: E11.9, Disp: 300 each, Rfl: 11   Insulin Syringe-Needle U-100 (BD INSULIN SYRINGE  U/F) 31G X 5/16" 0.5 ML MISC, Use to inject insulin 4 times daily. DX:E11.9, Disp: 100 each, Rfl: 4   Insulin Syringe-Needle U-100 (BD VEO INSULIN SYR U/F 1/2UNIT) 31G X 15/64" 0.3 ML MISC, USE TO INJECT INSULIN FOUR TIMES DAILY AS DIRECTED, Disp: 300 each, Rfl: 2   JANUVIA 50 MG tablet, TAKE 1 TABLET BY MOUTH EVERY DAY IN THE MORNING, Disp: 90 tablet, Rfl: 1   Lancets (ONETOUCH ULTRASOFT) lancets, Use to test blood sugars daily. Dx: E11.9, Disp: 100 each, Rfl: 12   Lancets MISC, USED TO CHECK BLOOD GLUCOSE 3-4 TIMES DAILY, Disp: 200 each, Rfl: 3   Multiple Vitamin (MULTIVITAMIN WITH MINERALS) TABS tablet, Take 1 tablet by mouth 2 (two) times daily., Disp: , Rfl:    Omega-3  Fatty Acids (FISH OIL) 1360 MG CAPS, Take 1,360 mg by mouth daily. , Disp: , Rfl:    pyridoxine (B-6) 100 MG tablet, Take 100 mg by mouth 2 (two) times daily., Disp: , Rfl:    ramipril (ALTACE) 5 MG capsule, TAKE 1 CAPSULE BY MOUTH EVERY DAY IN THE MORNING (Patient taking differently: Take 5 mg by mouth daily.), Disp: 90 capsule, Rfl: 4  Social History   Tobacco Use  Smoking Status Never  Smokeless Tobacco Never    No Known Allergies Objective:  There were no vitals filed for this visit. There is no height or weight on file to calculate BMI. Constitutional Well developed. Well nourished.  Vascular Dorsalis pedis pulses palpable bilaterally. Posterior tibial pulses palpable bilaterally. Capillary refill normal to all digits.  No cyanosis or clubbing noted. Pedal hair growth normal.  Neurologic Normal speech. Oriented to person, place, and time. Epicritic sensation to light touch grossly present bilaterally.  Dermatologic Nails well groomed and normal in appearance. No open wounds. No skin lesions.  Orthopedic: Right partial fourth fifth and third digit amputation noted.  No open wounds or lesion noted.  Patient has dressing on the right foot from wound care center which was not taken down.   Radiographs: None Assessment:  No diagnosis found. Plan:  Patient was evaluated and treated and all questions answered.  Pes planovalgus with history of amputations -All questions and concerns were discussed with the patient in extensive detail given the history of amputation in setting of his diabetes I believe patient will benefit from orthotics with diabetic accommodative. -Patient was casted for orthotics  No follow-ups on file.

## 2022-07-07 ENCOUNTER — Encounter (HOSPITAL_BASED_OUTPATIENT_CLINIC_OR_DEPARTMENT_OTHER): Payer: BLUE CROSS/BLUE SHIELD | Admitting: General Surgery

## 2022-07-07 DIAGNOSIS — E1149 Type 2 diabetes mellitus with other diabetic neurological complication: Secondary | ICD-10-CM | POA: Diagnosis not present

## 2022-07-07 NOTE — Progress Notes (Signed)
[name] ([id number]) [id number] Page 1 of 12 Visit Report for 07/07/2022 Chief Complaint Document Details Patient Name: Date of Service: [name], [name] 07/07/2022 7:30 A M Medical Record Number: [id number] Patient Account Number: [id number] Date of Birth/Sex: Treating RN: 09/19/1970 (52 y.o. M) Primary Care Provider: Tana [name] Other Clinician: Referring Provider: Treating Provider/Extender: [name] in Treatment: 52 Information Obtained from: Patient Chief Complaint Patients presents for treatment of an open diabetic ulcer Electronic Signature(s) Signed: 07/07/2022 8:32:54 AM By: [name] Guess MD FACS Entered By: [name] on 07/07/2022 08:32:53 -------------------------------------------------------------------------------- Cellular or Tissue Based Product Details Patient Name: Date of Service: [name], [name] 07/07/2022 7:30 A M Medical Record Number: [id number] Patient Account Number: [id number] Date of Birth/Sex: Treating RN: [birth year] (52 y.o. [name] Primary Care Provider: Tana [name] Other Clinician: Referring Provider: Treating Provider/Extender: [name] in Treatment: 52 Cellular or Tissue Based Product Type Wound #4 Right,Distal,Lateral Foot Applied to: Performed By: Physician [name] Guess, MD Cellular or Tissue Based Product Type: Epicord Level of Consciousness (Pre-procedure): Awake and Alert Pre-procedure Verification/Time Out Yes - 08:10 Taken: Location: genitalia / hands / feet / multiple digits Wound Size (sq cm): 0.96 Product Size (sq cm): 6 Waste Size (sq cm): 0 Amount of Product Applied (sq cm): 6 Instrument Used: Forceps, Scissors Lot #: [id number] Order #: 3 Expiration Date: 04/21/2026 Fenestrated: No Reconstituted: Yes Solution Type: saline Solution Amount: 3 ml Lot #: [id number] KS Solution Expiration Date:  02/11/2024 Secured: Yes Secured With: Steri-Strips Dressing Applied: Yes Primary Dressing: adaptic, gauze Procedural Pain: 0 Post Procedural Pain: 0 Response to Treatment: Procedure was tolerated well Level of Consciousness (Post- Awake and [address] [name], [name] ([id number]) [id number] Page 2 of 12 procedure): Post Procedure Diagnosis Same as Pre-procedure Electronic Signature(s) Signed: 07/07/2022 11:16:21 AM By: [name] Guess MD FACS Signed: 07/07/2022 5:12:02 PM By: [name] Deed RN, BSN Entered By: [name] on 07/07/2022 08:13:55 -------------------------------------------------------------------------------- Debridement Details Patient Name: Date of Service: [name], [name] 07/07/2022 7:30 A M Medical Record Number: [id number] Patient Account Number: [id number] Date of Birth/Sex: Treating RN: [birth year] (52 y.o. [name], [name] Primary Care Provider: Tana [name] Other Clinician: Referring Provider: Treating Provider/Extender: [name] in Treatment: 52 Debridement Performed for Assessment: Wound #4 Right,Distal,Lateral Foot Performed By: Physician [name] Guess, MD Debridement Type: Debridement Severity of Tissue Pre Debridement: Fat layer exposed Level of Consciousness (Pre-procedure): Awake and Alert Pre-procedure Verification/Time Out Yes - 08:05 Taken: Start Time: 08:05 Pain Control: Lidocaine 4% T opical Solution Percent of Wound Bed Debrided: 150% T Area Debrided (cm): otal 1.13 Tissue and other material debrided: Viable, Non-Viable, Callus, Slough, Subcutaneous, Skin: Epidermis, Slough Level: Skin/Subcutaneous Tissue Debridement Description: Excisional Instrument: Curette Bleeding: Minimum Hemostasis Achieved: Pressure Procedural Pain: 0 Post Procedural Pain: 0 Response to Treatment: Procedure was tolerated well Level of Consciousness (Post- Awake and Alert procedure): Post  Debridement Measurements of Total Wound Length: (cm) 0.8 Width: (cm) 1.2 Depth: (cm) 0.4 Volume: (cm) 0.302 Character of Wound/Ulcer Post Debridement: Stable Severity of Tissue Post Debridement: Fat layer exposed Post Procedure Diagnosis Same as Pre-procedure Notes Scribed for Dr. Lady [name] by [name] Deed, RN Electronic Signature(s) Signed: 07/07/2022 11:16:21 AM By: [name] Guess MD FACS Signed: 07/07/2022 5:12:02 PM By: [name] Deed RN, BSN Entered By: [name] on 07/07/2022 08:14:51 [name], Pride M ([id number]) [id number] Page 3 of 12 -------------------------------------------------------------------------------- HPI Details Patient Name: Date of Service: [name], [name] 07/07/2022 7:30  A M Medical Record Number: [id number] Patient Account Number: [id number] Date of Birth/Sex: Treating RN: September 24, 1970 (52 y.o. 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 The most recent amputation was in August 2022. At that operation, antibiotic beads were placed in the wound. He has been managed by podiatry for his procedures and management of his wounds. He has been in a Scientist, water quality. He is on oral doxycycline. They have been using Betadine wet to dry dressings along with Iodosorb. The patient states that when he thinks the wound is getting too dry, he applies topical Neosporin. At his last visit, on June 7 of this year, the podiatrist determined that he felt the wound was stalled and referred him to wound care for additional evaluation and management. An MRI has been ordered, but not yet  scheduled or performed. Pathology from his operation in August 2022 demonstrated findings consistent with chronic osteomyelitis. Today, there is a large irregular wound on the plantar surface of his right foot, at about the level of the fifth metatarsal head. This tracks through to a pinpoint opening on the dorsal lateral portion of his foot. The intake nurse reported purulent drainage. There is some malodor from the wound. No frank necrosis identified. 07/11/2021: Today, the wounds do not connect. I attempted multiple times from various directions and the shared tunnel is no longer open. He has some slough accumulation on the dorsal part of his foot as well as slough and callus buildup on the plantar surface. His MRI is scheduled for June 29. No purulent drainage or malodor appreciated today. 07/19/2021: The lateral foot wound has closed and there is no tunnel connecting the plantar foot wound to that site. The plantar foot wound still probes quite deeply, however. There is some slough, eschar, and nonviable tissue accumulated in the wound bed. No malodorous drainage present. His MRI was performed yesterday and is consistent with fairly extensive osteomyelitis. 07/29/2021: The patient has an appointment with infectious disease on July 18 to treat his osteomyelitis. The plantar wound still probes quite deeply, approaching bone. The orifice has narrowed quite substantially, however, making it more difficult for him to pack. 08/05/2021: The tunnel connecting the lateral foot wound to the plantar foot wound has reopened. He sees infectious disease tomorrow to discuss long-term antibiotic treatment for osteomyelitis. There was a bit of murky drainage in the wound, but this was noted after he had had topical lidocaine applied so may have just been a blob of the anesthetic. No odor or frank purulent drainage. The wound probes deeply at the midfoot approaching bone. He does have MRI results confirming his  diagnosis of osteomyelitis. He has failed to progress with conventional treatment and I think his best chance for preservation of the foot is to initiate hyperbaric oxygen therapy. 08/13/2021: The tunnel connecting the 2 wounds has closed again. He has a PICC line and is getting IV daptomycin and cefepime. EKG and chest x-ray are within normal limits. He is scheduled to start hyperbaric oxygen therapy tomorrow. The wound in his midfoot probes deeply, approaching bone. The skin at the orifice continues to heaped up and threatens to close over despite the large cavity within. No erythema, induration, or  purulent drainage. The wound on his lateral foot is fairly small and quite clean. 08/19/2021: The lateral foot wound has nearly closed. The wound in his midfoot does not probe quite as deeply today. The skin at the orifice continues to try to roll in and obscure the opening. No frankly necrotic tissue appreciated. He is tolerating hyperbaric oxygen therapy. 08/26/2021: The lateral foot wound has closed completely. The wound in his midfoot is shallower again today. The wound orifice is contracting. We are using gentamicin and silver alginate. He continues on IV daptomycin and cefepime and is tolerating hyperbaric oxygen therapy without difficulty. 09/02/2021: His foot is a little bit sore today but he was up walking on it all weekend doing back to school shopping with his daughter. The tunneling is down to about 3 cm and I do not appreciate bone at the tip of the probe. The wound is clean but has some callus creating an overhanging lip at the distal aspect. He continues to receive hyperbaric oxygen therapy as well as IV daptomycin and cefepime. 09/09/2021: His wound continues to improve. The tunnel has come in by 0.9 cm. He continues to form callus around the orifice of the wound. He is tolerating hyperbaric oxygen therapy along with his IV antibiotics. 09/16/2021: The tunneling is down to just half a  centimeter. He continues to build up callus around the orifice of the wound. He completed his course of IV antibiotics and his PICC line is scheduled to be removed this Friday. He is tolerating hyperbaric oxygen without difficulty. We have been applying topical gentamicin and silver alginate to his wound. 09/24/2021: The wound depth has come in even further. There is a little bit of callus buildup around the orifice. The opening is quite narrow, at this point. 09/30/2021: The wound depth is about the same this week. The opening continues to contract with callus accumulation. There is some discoloration of the skin on the lateral part of his foot and he admitted to walking more than usual this weekend and also wearing a different pair shoes. He continues to tolerate hyperbaric oxygen therapy without difficulty. 10/07/2021: The wound depth has contracted a bit and is now approximately 1 to 1.5 cm. The orifice of the wound continues to try and close in over the space. There is just some callus and skin around the margins obscuring the orifice. 10/14/2021: The wound depth has come in a little bit more. The orifice of the wound is rolling inwards with epithelium and callus, making it difficult to pack the wound. 10/21/2021: There is still 1 portion of the wound, at the lateral aspect, that is still a couple of centimeters deep. The architecture of the wound makes this somewhat challenging to access and pack. Everything else looks clean. He has his usual accumulation of callus and skin, narrowing the orifice. 10/29/2021: No significant change to the depth of the wound. The orifice continues to contract with callus and skin narrowing the opening. His wife has been packing the wound and doing an excellent job. [name], [name] Felton ([id number]) [id number] Page 4 of 12 11/05/2021: The depth of the wound has come in by about a centimeter. There is less callus accumulation around the  orificedespite this, the wound is narrower today. We are still awaiting insurance approval for Kerecis powder. 11/12/2021: The depth of the wound continues to contract. He has accumulated some callus around the orifice, as usual. We have received a verbal confirmation that he is approved for Kerecis, but no formal paperwork  has yet been received. 11/19/2021: The depth continues to come in. There is callus accumulation around the orifice, as usual. He has been formally approved for Kerecis, but unfortunately we do not have a billing mechanism for the powder in [facility] yet. We are working to address this so we can begin treatment. 11/25/2021: Continued filling in of the depth of the wound. Continued buildup of callus around the orifice. No concern for infection. As we do not have a way to bill for Kerecis powder, but are capable of billing for the [facility] sheet, we are going to use that on him instead. 12/03/2021: The depth of the wound continues to fill in. As usual, he has accumulated callus and skin around the orifice of the wound. No concern for infection. He will complete his hyperbaric oxygen therapy this week. 12/17/2021: The wound depth continues to contract. There is callus and skin accumulation around the orifice of the wound, as per usual. There is more epithelium on the actual wound surface. Unfortunately, his [employer]len Round was not ordered. 12/24/2021: The wound depth has come in a little bit more this week. There is less skin accumulation around the orifice, but still some callus. On questioning, he admits to spending a fair amount of time on his feet. 12/12; this is a very difficult wound to assess size slitlike wound with some depth but minimal opening. I applied Kerecis No. 4 12/18; Kerecis No. 5 01/15/2022: His wound measured deeper today. There is also some evidence of pressure induced tissue injury around the lateral aspect of his foot. The patient reports that he was wearing different shoes  than usual and felt like his foot was rolling a lot over the weekend. There is heavy callus around the orifice of the wound. No concern for infection. 01/21/2022: The depth decreased today from 1.7 to 1.0 cm. Significantly less tissue damage as compared to last week. Still with callus and skin accumulation around the orifice. 01/29/2022: The wound continues to contract and the depth is coming in further. We are still working on getting [employer] for him. 02/04/2022: No real change this week. Still working on the [employer]. 02/11/2022: No significant changes again. We are still working on the [employer]. 02/18/2022: No change to the wound. We have submitted in IVR for Epicord. 02/25/2022: Unfortunately, his wound has deteriorated over the past week. It is deeper and is approaching bone once again. There is more slough accumulation and the tissue shows signs of pressure injury. He says that he has been trying to wear his new work boots, which are waterproof. He says they make his feet sweat excessively. I think our main challenge with him is that we just are not able to adequately offload him while he is still working and on his feet through much of the day. He is willing to consider total contact casting, but will need to make some arrangements with work. On a more positive note, however, he has been approved for Epicord. 03/03/2022: The wound probed a little bit deeper again today. We are planning to put him in a total contact cast as soon as he has cleared it with work to take some time off. 03/10/2022: The wound was a little bit shallower today and the overall appearance is significantly improved. We are planning to put him in a cast today. 03/12/2022: He is here for his first total contact cast change. He denies any issues with pressure, rubbing, or pain. 03/17/2022: For some reason, the wound probes deeper  today. There is some slough and nonviable subcutaneous tissue around the opening. He  denies any difficulty with the total contact cast. 03/24/2022: The wound is shallower today. There is better granulation tissue visible on the wound surface. 03/31/2022: His cast protector got a hole in it and his cast got wet. His foot is macerated. The wound measured a little deeper today. There is some hypertrophic granulation tissue at the orifice of his wound. 3/18; patient presents for follow-up. He had no issues with the total contact cast. We have been using epi cord to the wound bed. Overall wound is stable. Patient denies signs of infection. 04/14/2022: The depth of the wound has rather remarkably decreased to 1.3 cm today. The orifice of the wound has also contracted quite a bit. 04/21/2022: The depth of the wound has decreased even further. The orifice is also smaller. The wound is very clean. 04/28/2022: The depth of the wound is down to 0.5 cm. The orifice continues to contract. 05/06/2022: The primary wound is substantially smaller and shallower today. Unfortunately, he developed a blister distal to this on the plantar surface of his foot. The tissue breakdown underneath the blister does extend into the fat layer in 1 area over a bony prominence from his collapsed arch. The remainder of the tissue underneath the blister is intact skin. 05/12/2022: The primary wound is down to just a couple of millimeters and is flush with the surrounding skin. The area that had blistered last week had some purulent drainage coming follow-up a corner that was underneath some dry skin. No malodor associated with this. There is some additional slough that has accumulated. 05/19/2022: His primary wound has actually healed. The area that opened after a blister formed is smaller. There is a little bit of thickened skin around the edges, no concern for infection. 05/26/2022: The wound is stable. Unfortunately, for some reason the primary dressing was left off and there was no silver alginate applied. There was also  no zinc oxide applied to the periwound so there is some tissue maceration present due to the fact that the only thing used on the wound was a foam donut, which retains the moisture from his foot. 06/02/2022: The wound measured 2 mm wider today. There is still some blue-green staining on the surface. 06/09/2022: No change to the wound dimensions. The culture that I took last week grew out Pseudomonas aeruginosa and Enterococcus faecalis. Levofloxacin was prescribed and he began taking this last Wednesday. 06/18/2022: He completed his levofloxacin yesterday. Today, the wound has not changed in its dimensions. There is callus accumulation around the wound and [name], [name] ([id number]) [id number] Page 5 of 12 some slough on the wound surface. For what ever reason, we do not seem to be getting the offloading affect from the total contact cast that one might expect. 06/23/2022: The wound is a little bit smaller today. There is some undermining created by accumulated callus, but underneath the callus, there is epithelialization. Minimal slough on the surface. He has been approved for Epicord, so we will apply that today. He seems to be getting adequate offloading using the peg assist insert in his shoe. 06/30/2022: The wound is smaller today. There is no undermining. The wound surface looks healthier. Still with some periwound callus accumulation. 07/07/2022: The wound is about the same size. The center of the wound is fairly soft and mushy, however. Electronic Signature(s) Signed: 07/07/2022 8:33:38 AM By: [name] Guess MD FACS Entered By: [name] on 07/07/2022 08:33:38 --------------------------------------------------------------------------------  Physical Exam Details Patient Name: Date of Service: [name], [name] 07/07/2022 7:30 A M Medical Record Number: [id number] Patient Account Number: [id number] Date of Birth/Sex: Treating RN: 1970/07/11 (52 y.o.  M) Primary Care Provider: Tana [name] Other Clinician: Referring Provider: Treating Provider/Extender: [name] in Treatment: 52 Constitutional Hypertensive, asymptomatic. . . . no acute distress. Respiratory Normal work of breathing on room air. Notes 07/07/2022: The wound is about the same size. The center of the wound is fairly soft and mushy, however. Electronic Signature(s) Signed: 07/07/2022 8:34:06 AM By: [name] Guess MD FACS Entered By: [name] on 07/07/2022 08:34:06 -------------------------------------------------------------------------------- Physician Orders Details Patient Name: Date of Service: [name], [name] 07/07/2022 7:30 A M Medical Record Number: [id number] Patient Account Number: [id number] Date of Birth/Sex: Treating RN: 11-Aug-1970 (52 y.o. 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 - Dr. Lady [name] RM 2 Return A Monday 7/1 @ 08:30 am [name], [name] ([id number]) [id number] Page 6 of 12 Anesthetic (In clinic) Topical Lidocaine 4% applied to wound bed Cellular or Tissue Based Products Cellular or Tissue Based Product Type: - Epicord #3 Cellular or Tissue Based Product applied to wound bed, secured with steri-strips, cover with Adaptic or Mepitel. (DO NOT REMOVE). Bathing/ Shower/ Hygiene May shower and wash wound with soap and water. - Please keep right foot dry-while Epicord Skin Sub is on the  foot Off-Loading Open toe surgical shoe to: - with peg assist insert, put insert into work boot when working Additional Orders / Instructions Other: - remove dressing in one week and use silver alginate on the wound bed, change dressing every day Wound Treatment Wound #4 - Foot Wound Laterality: Right, Lateral, Distal Cleanser: Soap and Water 1 x Per Week/30 Days Discharge Instructions: May shower and wash wound with dial antibacterial soap and water prior to dressing change. Cleanser: Wound Cleanser (Generic) 1 x Per Week/30 Days Discharge Instructions: Cleanse the wound with wound cleanser prior to applying a clean dressing using gauze sponges, not tissue or cotton balls. Prim Dressing: ADAPTIC TOUCH 3x4.25 (in/in) 1 x Per Week/30 Days ary Discharge Instructions: Apply to wound bed as instructed Prim Dressing: Epicord #3 ary 1 x Per Week/30 Days Secondary Dressing: Woven Gauze Sponge, Non-Sterile 4x4 in 1 x Per Week/30 Days Discharge Instructions: Apply over primary dressing as directed. Secured With: Paper Tape, 2x10 (in/yd) (Generic) 1 x Per Week/30 Days Discharge Instructions: Secure dressing with tape as directed. Compression Wrap: Kerlix Roll 4.5x3.1 (in/yd) (Generic) 1 x Per Week/30 Days Discharge Instructions: Apply Kerlix and Coban compression as directed. Electronic Signature(s) Signed: 07/07/2022 11:16:21 AM By: [name] Guess MD FACS Entered By: [name] on 07/07/2022 08:34:19 -------------------------------------------------------------------------------- Problem List Details Patient Name: Date of Service: [name], [name] 07/07/2022 7:30 A M Medical Record Number: [id number] Patient Account Number: [id number] Date of Birth/Sex: Treating RN: 25-Aug-1970 (52 y.o. 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 07/04/2021 No Yes M86.671 Other chronic osteomyelitis, right ankle and foot 07/04/2021 No Yes [name], [name] ([id number]) [id number] Page 7 of 12 Inactive Problems Resolved Problems ICD-10 Code Description Active Date Resolved Date L97.514 Non-pressure chronic ulcer of other part of right foot with necrosis of bone 07/04/2021 07/04/2021 Electronic Signature(s) Signed: 07/07/2022 8:32:39 AM By: [name] Guess MD FACS Entered By: [name] on 07/07/2022 08:32:39 -------------------------------------------------------------------------------- Progress Note Details Patient Name: Date of Service: [name], [name] 07/07/2022 7:30 A M Medical Record Number: [id number] Patient Account Number: [id number] Date of Birth/Sex: Treating RN: 18-Mar-1970 (52 y.o. 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 The most recent amputation was in August 2022. At that operation, antibiotic beads were placed in the wound. He has been managed by podiatry for his procedures and management of his wounds. He has been in a Scientist, water quality. He is on oral doxycycline. They have been using Betadine wet to dry dressings along with Iodosorb. The patient states that when  he thinks the wound is getting too dry, he applies topical Neosporin. At his last visit, on June 7 of this year, the podiatrist determined that he felt the wound was stalled and referred him to wound care for additional evaluation and management. An MRI has been ordered, but not yet scheduled or performed. Pathology from his operation in August 2022 demonstrated findings consistent with chronic osteomyelitis. Today, there is a large irregular wound on the plantar surface of his right foot, at about the level of the fifth metatarsal head. This tracks through to a pinpoint opening on the dorsal lateral portion of his foot. The intake nurse reported purulent drainage. There is some malodor from the wound. No frank necrosis identified. 07/11/2021: Today, the wounds do not connect. I attempted multiple times from various directions and the shared tunnel is no longer open. He has some slough accumulation on the dorsal part of his foot as well as slough and callus buildup on the plantar surface. His MRI is scheduled for June 29. No purulent drainage or malodor appreciated today. 07/19/2021: The lateral foot wound has closed and there is no tunnel connecting the plantar foot wound to that site. The plantar foot wound still probes quite deeply, however. There is some slough, eschar, and nonviable tissue accumulated in the wound bed. No malodorous drainage present. His MRI was performed yesterday and is consistent with fairly extensive osteomyelitis. 07/29/2021: The patient has an appointment with infectious disease on July 18 to treat his osteomyelitis. The plantar wound still probes quite deeply, approaching bone. The orifice has narrowed quite substantially, however, making it more difficult for him to pack. 08/05/2021: The tunnel connecting the lateral foot wound to the plantar foot wound has reopened. He sees infectious disease tomorrow to discuss long-term antibiotic treatment for osteomyelitis. There was a  bit of murky drainage in the wound, but this was noted after he had had topical lidocaine applied so may have just been a blob of the anesthetic. No odor or frank  purulent drainage. The wound probes deeply at the midfoot approaching bone. He does have MRI results confirming his diagnosis of osteomyelitis. He has failed to progress with conventional treatment and I think his best chance for preservation of the foot is to initiate hyperbaric oxygen therapy. 08/13/2021: The tunnel connecting the 2 wounds has closed again. He has a PICC line and is getting IV daptomycin and cefepime. EKG and chest x-ray are within normal limits. He is scheduled to start hyperbaric oxygen therapy tomorrow. The wound in his midfoot probes deeply, approaching bone. The skin at the orifice continues to heaped up and threatens to close over despite the large cavity within. No erythema, induration, or purulent drainage. The wound on his lateral foot is fairly small and quite clean. 08/19/2021: The lateral foot wound has nearly closed. The wound in his midfoot does not probe quite as deeply today. The skin at the orifice continues to try to roll in and obscure the opening. No frankly necrotic tissue appreciated. He is tolerating hyperbaric oxygen therapy. 08/26/2021: The lateral foot wound has closed completely. The wound in his midfoot is shallower again today. The wound orifice is contracting. We are using [name], [name] Z ([id number]) [id number] Page 8 of 12 gentamicin and silver alginate. He continues on IV daptomycin and cefepime and is tolerating hyperbaric oxygen therapy without difficulty. 09/02/2021: His foot is a little bit sore today but he was up walking on it all weekend doing back to school shopping with his daughter. The tunneling is down to about 3 cm and I do not appreciate bone at the tip of the probe. The wound is clean but has some callus creating an overhanging lip at the distal aspect.  He continues to receive hyperbaric oxygen therapy as well as IV daptomycin and cefepime. 09/09/2021: His wound continues to improve. The tunnel has come in by 0.9 cm. He continues to form callus around the orifice of the wound. He is tolerating hyperbaric oxygen therapy along with his IV antibiotics. 09/16/2021: The tunneling is down to just half a centimeter. He continues to build up callus around the orifice of the wound. He completed his course of IV antibiotics and his PICC line is scheduled to be removed this Friday. He is tolerating hyperbaric oxygen without difficulty. We have been applying topical gentamicin and silver alginate to his wound. 09/24/2021: The wound depth has come in even further. There is a little bit of callus buildup around the orifice. The opening is quite narrow, at this point. 09/30/2021: The wound depth is about the same this week. The opening continues to contract with callus accumulation. There is some discoloration of the skin on the lateral part of his foot and he admitted to walking more than usual this weekend and also wearing a different pair shoes. He continues to tolerate hyperbaric oxygen therapy without difficulty. 10/07/2021: The wound depth has contracted a bit and is now approximately 1 to 1.5 cm. The orifice of the wound continues to try and close in over the space. There is just some callus and skin around the margins obscuring the orifice. 10/14/2021: The wound depth has come in a little bit more. The orifice of the wound is rolling inwards with epithelium and callus, making it difficult to pack the wound. 10/21/2021: There is still 1 portion of the wound, at the lateral aspect, that is still a couple of centimeters deep. The architecture of the wound makes this somewhat challenging to access and pack. Everything else looks clean. He  has his usual accumulation of callus and skin, narrowing the orifice. 10/29/2021: No significant change to the depth of the wound.  The orifice continues to contract with callus and skin narrowing the opening. His wife has been packing the wound and doing an excellent job. 11/05/2021: The depth of the wound has come in by about a centimeter. There is less callus accumulation around the orificedespite this, the wound is narrower today. We are still awaiting insurance approval for Kerecis powder. 11/12/2021: The depth of the wound continues to contract. He has accumulated some callus around the orifice, as usual. We have received a verbal confirmation that he is approved for Kerecis, but no formal paperwork has yet been received. 11/19/2021: The depth continues to come in. There is callus accumulation around the orifice, as usual. He has been formally approved for Kerecis, but unfortunately we do not have a billing mechanism for the powder in [employer] yet. We are working to address this so we can begin treatment. 11/25/2021: Continued filling in of the depth of the wound. Continued buildup of callus around the orifice. No concern for infection. As we do not have a way to bill for Kerecis powder, but are capable of billing for the [facility] sheet, we are going to use that on him instead. 12/03/2021: The depth of the wound continues to fill in. As usual, he has accumulated callus and skin around the orifice of the wound. No concern for infection. He will complete his hyperbaric oxygen therapy this week. 12/17/2021: The wound depth continues to contract. There is callus and skin accumulation around the orifice of the wound, as per usual. There is more epithelium on the actual wound surface. Unfortunately, his Darlen Round was not ordered. 12/24/2021: The wound depth has come in a little bit more this week. There is less skin accumulation around the orifice, but still some callus. On questioning, he admits to spending a fair amount of time on his feet. 12/12; this is a very difficult wound to assess size slitlike wound with some depth but minimal  opening. I applied Kerecis No. 4 12/18; Kerecis No. 5 01/15/2022: His wound measured deeper today. There is also some evidence of pressure induced tissue injury around the lateral aspect of his foot. The patient reports that he was wearing different shoes than usual and felt like his foot was rolling a lot over the weekend. There is heavy callus around the orifice of the wound. No concern for infection. 01/21/2022: The depth decreased today from 1.7 to 1.0 cm. Significantly less tissue damage as compared to last week. Still with callus and skin accumulation around the orifice. 01/29/2022: The wound continues to contract and the depth is coming in further. We are still working on getting [employer] for him. 02/04/2022: No real change this week. Still working on the [employer]. 02/11/2022: No significant changes again. We are still working on the [employer]. 02/18/2022: No change to the wound. We have submitted in IVR for Epicord. 02/25/2022: Unfortunately, his wound has deteriorated over the past week. It is deeper and is approaching bone once again. There is more slough accumulation and the tissue shows signs of pressure injury. He says that he has been trying to wear his new work boots, which are waterproof. He says they make his feet sweat excessively. I think our main challenge with him is that we just are not able to adequately offload him while he is still working and on his feet through much of the day. He  is willing to consider total contact casting, but will need to make some arrangements with work. On a more positive note, however, he has been approved for Epicord. 03/03/2022: The wound probed a little bit deeper again today. We are planning to put him in a total contact cast as soon as he has cleared it with work to take some time off. 03/10/2022: The wound was a little bit shallower today and the overall appearance is significantly improved. We are planning to put him in a cast  today. 03/12/2022: He is here for his first total contact cast change. He denies any issues with pressure, rubbing, or pain. 03/17/2022: For some reason, the wound probes deeper today. There is some slough and nonviable subcutaneous tissue around the opening. He denies any difficulty with the total contact cast. 03/24/2022: The wound is shallower today. There is better granulation tissue visible on the wound surface. 03/31/2022: His cast protector got a hole in it and his cast got wet. His foot is macerated. The wound measured a little deeper today. There is some hypertrophic granulation tissue at the orifice of his wound. [name] ([id number]) [id number] Page 9 of 12 3/18; patient presents for follow-up. He had no issues with the total contact cast. We have been using epi cord to the wound bed. Overall wound is stable. Patient denies signs of infection. 04/14/2022: The depth of the wound has rather remarkably decreased to 1.3 cm today. The orifice of the wound has also contracted quite a bit. 04/21/2022: The depth of the wound has decreased even further. The orifice is also smaller. The wound is very clean. 04/28/2022: The depth of the wound is down to 0.5 cm. The orifice continues to contract. 05/06/2022: The primary wound is substantially smaller and shallower today. Unfortunately, he developed a blister distal to this on the plantar surface of his foot. The tissue breakdown underneath the blister does extend into the fat layer in 1 area over a bony prominence from his collapsed arch. The remainder of the tissue underneath the blister is intact skin. 05/12/2022: The primary wound is down to just a couple of millimeters and is flush with the surrounding skin. The area that had blistered last week had some purulent drainage coming follow-up a corner that was underneath some dry skin. No malodor associated with this. There is some additional slough that  has accumulated. 05/19/2022: His primary wound has actually healed. The area that opened after a blister formed is smaller. There is a little bit of thickened skin around the edges, no concern for infection. 05/26/2022: The wound is stable. Unfortunately, for some reason the primary dressing was left off and there was no silver alginate applied. There was also no zinc oxide applied to the periwound so there is some tissue maceration present due to the fact that the only thing used on the wound was a foam donut, which retains the moisture from his foot. 06/02/2022: The wound measured 2 mm wider today. There is still some blue-green staining on the surface. 06/09/2022: No change to the wound dimensions. The culture that I took last week grew out Pseudomonas aeruginosa and Enterococcus faecalis. Levofloxacin was prescribed and he began taking this last Wednesday. 06/18/2022: He completed his levofloxacin yesterday. Today, the wound has not changed in its dimensions. There is callus accumulation around the wound and some slough on the wound surface. For what ever reason, we do not seem to be getting the offloading affect from the total contact cast that one  might expect. 06/23/2022: The wound is a little bit smaller today. There is some undermining created by accumulated callus, but underneath the callus, there is epithelialization. Minimal slough on the surface. He has been approved for Epicord, so we will apply that today. He seems to be getting adequate offloading using the peg assist insert in his shoe. 06/30/2022: The wound is smaller today. There is no undermining. The wound surface looks healthier. Still with some periwound callus accumulation. 07/07/2022: The wound is about the same size. The center of the wound is fairly soft and mushy, however. Patient History Information obtained from Patient. Social History Never smoker, Marital Status - Married, Alcohol Use - Never, Drug Use - No History,  Caffeine Use - Daily - T coffee. ea; Medical History Endocrine Patient has history of Type II Diabetes Hospitalization/Surgery History - Amuptation of 3rd,4th and 5th toes of Right foot;Oral Surgery;Anal Fissure surgery; Cholecystectomy. Objective Constitutional Hypertensive, asymptomatic. no acute distress. Vitals Time Taken: 7:47 AM, Height: 72 in, Weight: 312 lbs, BMI: 42.3, Temperature: 98.6 F, Pulse: 76 bpm, Respiratory Rate: 18 breaths/min, Blood Pressure: 161/93 mmHg, Capillary Blood Glucose: 128 mg/dl. General Notes: glucose per pt report this am Respiratory Normal work of breathing on room air. General Notes: 07/07/2022: The wound is about the same size. The center of the wound is fairly soft and mushy, however. Integumentary (Hair, Skin) Wound #4 status is Open. Original cause of wound was Blister. The date acquired was: 05/06/2022. The wound has been in treatment 8 weeks. The wound is located on the Right,Distal,Lateral Foot. The wound measures 0.8cm length x 1.2cm width x 0.5cm depth; 0.754cm^2 area and 0.377cm^3 volume. There is Fat Layer (Subcutaneous Tissue) exposed. There is no tunneling or undermining noted. There is a medium amount of serosanguineous drainage noted. The wound margin is distinct with the outline attached to the wound base. There is large (67-100%) red granulation within the wound bed. There is a small (1-33%) amount of necrotic tissue within the wound bed including Adherent Slough. The periwound skin appearance had no abnormalities noted for color. The periwound skin appearance exhibited: Callus. The periwound skin appearance did not exhibit: Dry/Scaly, Maceration. Periwound temperature was noted as No Abnormality. 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 There was a Excisional Skin/Subcutaneous Tissue Debridement with a total area of 1.13 sq cm performed by [name] Guess, MD. With the following instrument(s): Curette to remove Viable and Non-Viable tissue/material. Material removed includes Callus, Subcutaneous Tissue, Slough, and Skin: Epidermis after achieving pain control using Lidocaine 4% Topical Solution. No specimens were taken. A time out was conducted at 08:05, prior to the start of the procedure. A Minimum amount of bleeding was controlled with Pressure. The procedure was tolerated well with a pain level of 0 throughout and a pain level of 0 following the procedure. Post Debridement Measurements: 0.8cm length x 1.2cm width x 0.4cm depth; 0.302cm^3 volume. Character of Wound/Ulcer Post Debridement is stable. Severity of Tissue Post Debridement is: Fat layer exposed. Post procedure Diagnosis Wound #4: Same as Pre-Procedure General Notes: Scribed for Dr. Lady [name] by [name] Deed, RN. Pre-procedure diagnosis of Wound #4  is a Diabetic Wound/Ulcer of the Lower Extremity located on the Right,Distal,Lateral Foot. A skin graft procedure using a bioengineered skin substitute/cellular or tissue based product was performed by [name] Guess, MD with the following instrument(s): Forceps and Scissors. Epicord was applied and secured with Steri-Strips. 6 sq cm of product was utilized and 0 sq cm was wasted. Post Application, adaptic, gauze was applied. A Time Out was conducted at 08:10, prior to the start of the procedure. The procedure was tolerated well with a pain level of 0 throughout and a pain level of 0 following the procedure. Post procedure Diagnosis Wound #4:  Same as Pre-Procedure . Plan Follow-up Appointments: Return Appointment in 2 weeks. - Dr. Lady [name] RM 2 Monday 7/1 @ 08:30 am Anesthetic: (In clinic) Topical Lidocaine 4% applied to wound bed Cellular or Tissue Based Products: Cellular or Tissue Based Product Type: - Epicord #3 Cellular or Tissue Based Product applied to wound bed, secured with steri-strips, cover with Adaptic or Mepitel. (DO NOT REMOVE). Bathing/ Shower/ Hygiene: May shower and wash wound with soap and water. - Please keep right foot dry-while Epicord Skin Sub is on the foot Off-Loading: Open toe surgical shoe to: - with peg assist insert, put insert into work boot when working Additional Orders / Instructions: Other: - remove dressing in one week and use silver alginate on the wound bed, change dressing every day WOUND #4: - Foot Wound Laterality: Right, Lateral, Distal Cleanser: Soap and Water 1 x Per Week/30 Days Discharge Instructions: May shower and wash wound with dial antibacterial soap and water prior to dressing change. Cleanser: Wound Cleanser (Generic) 1 x Per Week/30 Days Discharge Instructions: Cleanse the wound with wound cleanser prior to applying a clean dressing using gauze sponges, not tissue or cotton balls. Prim Dressing: ADAPTIC TOUCH 3x4.25 (in/in) 1 x Per Week/30 Days ary Discharge Instructions: Apply to wound bed as instructed Prim Dressing: Epicord #3 1 x Per Week/30 Days ary Secondary Dressing: Woven Gauze Sponge, Non-Sterile 4x4 in 1 x Per Week/30 Days Discharge Instructions: Apply over primary dressing as directed. Secured With: Paper T ape, 2x10 (in/yd) (Generic) 1 x Per Week/30 Days Discharge Instructions: Secure dressing with tape as directed. Com pression Wrap: Kerlix Roll 4.5x3.1 (in/yd) (Generic) 1 x Per Week/30 Days Discharge Instructions: Apply Kerlix and Coban compression as directed. 07/07/2022: The wound is about the same size. The center of the wound is fairly soft and mushy,  however. I used a curette to debride callus and subcutaneous tissue from the wound. I was fairly aggressive, in an effort to turn this into a more acute wound and stimulate the healing cascade. Epicort was then rehydrated and applied in standard fashion. It was secured with Adaptic and Steri-Strips. He is leaving town and so he will apply silver alginate after a week. He will follow-up upon his return. Electronic Signature(s) Signed: 07/07/2022 8:35:55 AM By: [name] Guess MD FACS Entered By: [name] on 07/07/2022 08:35:55 [name], [name] ([id number]) [id number] Page 11 of 12 -------------------------------------------------------------------------------- HxROS Details Patient Name: Date of Service: [name], [name] 07/07/2022 7:30 A M Medical Record Number: [id number] Patient Account Number: [id number] Date of Birth/Sex: Treating RN: August 06, 1970 (52 y.o. M) Primary Care Provider: Tana [name] Other Clinician: Referring Provider: Treating Provider/Extender: [name] in Treatment: 52 Information Obtained From Patient Endocrine Medical History: Positive for: Type II Diabetes Immunizations Pneumococcal Vaccine: Received Pneumococcal Vaccination: Yes Received Pneumococcal Vaccination On or After 60th Birthday: No Implantable Devices None Hospitalization /  Surgery History Type of Hospitalization/Surgery Amuptation of 3rd,4th and 5th toes of Right foot;Oral Surgery;Anal Fissure surgery; Cholecystectomy Family and Social History Never smoker; Marital Status - Married; Alcohol Use: Never; Drug Use: No History; Caffeine Use: Daily - T coffee; Financial Concerns: No; Food, ea; Clothing or Shelter Needs: No; Support System Lacking: No; Transportation Concerns: No Electronic Signature(s) Signed: 07/07/2022 11:16:21 AM By: [name] Guess MD FACS Entered By: [name] on 07/07/2022  08:33:44 -------------------------------------------------------------------------------- SuperBill Details Patient Name: Date of Service: [name], [name] 07/07/2022 Medical Record Number: [id number] Patient Account Number: [id number] Date of Birth/Sex: Treating RN: 31-Aug-1970 (52 y.o. [name] Primary Care Provider: Tana [name] Other Clinician: Referring Provider: Treating Provider/Extender: [name] in Treatment: 52 Diagnosis Coding ICD-10 Codes Code Description [phone] Non-pressure chronic ulcer of other part of right foot with fat layer exposed E11.49 Type 2 diabetes mellitus with other diabetic neurological complication M86.671 Other chronic osteomyelitis, right ankle and foot Facility Procedures : [name], E CPT4 Code: [id number] DWARD M ([id number] Description: [phone] Epicord 2cm x 3cm - per sqcm ) [id number] Modifier: [id number] Quantity: 6 [id number].pdf Page 12 of 12 : CPT4 Code: [id number] 1 Description: 5275 - SKIN SUB GRAFT FACE/NK/HF/G ICD-10 Diagnosis Description L97.512 Non-pressure chronic ulcer of other part of right foot with fat layer expos Modifier: ed Quantity: 1 Physician Procedures : CPT4 Code Description Modifier [id number] - WC PHYS LEVEL 3 - EST PT 25 ICD-10 Diagnosis Description L97.512 Non-pressure chronic ulcer of other part of right foot with fat layer exposed E11.49 Type 2 diabetes mellitus with other diabetic  neurological complication M86.671 Other chronic osteomyelitis, right ankle and foot Quantity: 1 : [id number] 15275 - WC PHYS SKIN SUB GRAFT FACE/NK/HF/G ICD-10 Diagnosis Description L97.512 Non-pressure chronic ulcer of other part of right foot with fat layer exposed Quantity: 1 Electronic Signature(s) Signed: 07/07/2022 8:36:11 AM By: [name] Guess MD FACS Entered By: [name] on 07/07/2022 08:36:11

## 2022-07-07 NOTE — Progress Notes (Signed)
[name], [name] ([id number]) [id number] Page 1 of 8 Visit Report for 07/07/2022 Arrival Information Details Patient Name: Date of Service: [name], [name] 07/07/2022 7:30 [name] Medical Record Number: [id number] Patient Account Number: [id number] Date of Birth/Sex: Treating RN: 1970-09-08 ([name] y.o. [name] Primary Care [name]: [name] Other Clinician: Referring [name]: Treating [name]/Extender: [name] [name] Visit Information History Since Last Visit Added or deleted any medications: No Patient Arrived: Ambulatory Any [name] allergies or adverse reactions: No Arrival Time: 07:44 Had a fall or experienced change [name] No Accompanied By: self activities of daily living that may affect Transfer Assistance: None risk of falls: Patient Identification Verified: Yes Signs or symptoms of abuse/neglect since last visito No Secondary Verification Process Completed: Yes Hospitalized since last visit: No Patient Requires Transmission-Based Precautions: No Implantable device outside of the clinic excluding No Patient Has Alerts: Yes cellular tissue based products placed [name] the center since last visit: Has Dressing [name] Place as Prescribed: Yes Has Footwear/Offloading [name] Place as Prescribed: Yes Right: Other:pegassist Pain Present Now: No Electronic Signature(s) Signed: 07/07/2022 5:12:02 PM By: [name] Deed RN, BSN Entered By: [name] on 07/07/2022 07:47:16 -------------------------------------------------------------------------------- Encounter Discharge Information Details Patient Name: Date of Service: [name] 07/07/2022 7:30 [name] Medical Record Number: [id number] Patient Account Number: [id number] Date of Birth/Sex: Treating RN: [birth year] (52 y.o. [name] Primary Care [name]: [name] Other Clinician: Referring [name]: Treating [name]/Extender: [name] [name] Treatment: 38 Encounter Discharge Information Items Post Procedure Vitals Discharge Condition: Stable Temperature (F): 98.6 Ambulatory Status: Ambulatory Pulse (bpm): 76 Discharge Destination: Home Respiratory Rate (breaths/min): 18 Transportation: Private Auto Blood Pressure (mmHg): 161/93 Accompanied By: self Schedule Follow-up Appointment: Yes Clinical Summary of Care: Patient Declined Electronic Signature(s) Signed: 07/07/2022 5:12:02 PM By: [name] Deed RN, BSN Entered By: [name] on 07/07/2022 08:26:21 [name], [name] ([id number]) [id number] Page 2 of 8 -------------------------------------------------------------------------------- Lower Extremity Assessment Details Patient Name: Date of Service: [name] 07/07/2022 7:30 [name] Medical Record Number: [id number] Patient Account Number: [id number] Date of Birth/Sex: Treating RN: [birth year] (52 y.o. [name] Primary Care [name]: [name] Other Clinician: Referring Breckyn Ticas: Treating [name]/Extender: [name] [name] Edema Assessment Assessed: [name]: No] [Right: No] Edema: [Left: N] [Right: o] Calf Left: Right: Point of Measurement: From Medial Instep 40 cm Ankle Left: Right: Point of Measurement: From Medial Instep 24 cm Vascular Assessment Pulses: Dorsalis Pedis Palpable: [Right:Yes] Electronic Signature(s) Signed: 07/07/2022 5:12:02 PM By: [name] Deed RN, BSN Entered By: [name] on 07/07/2022 07:51:29 -------------------------------------------------------------------------------- Multi Wound Chart Details Patient Name: Date of Service: [name] 07/07/2022 7:30 [name] Medical Record Number: [id number] Patient Account Number: [id number] Date of Birth/Sex: Treating RN: 11-18-1970 (52 y.o. 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 Type II Diabetes N/A N/A Comorbid History: 05/06/2022 N/A N/A Date Acquired: 8 N/A N/A Weeks of Treatment: Open N/A N/A Wound Status: No N/A N/A Wound Recurrence: 0.8x1.2x0.5 N/A N/A Measurements L  x W x D (cm) 0.754 N/A N/A A (cm) : rea 0.377 N/A N/A Volume (cm) : [name]00% N/A N/A % Reduction [name] A rea: -140.10% N/A N/A % Reduction [name] Volume: Grade 1 N/A N/A Classification: Medium N/A N/A Exudate A mount: Serosanguineous N/A N/A Exudate Type: red, brown N/A N/A Exudate Color: Distinct, outline attached N/A N/A Wound Margin: Large (67-100%) N/A N/A Granulation A mount: Red N/A N/A Granulation Quality: Small (1-33%) N/A N/A Necrotic A mount: Fat Layer (Subcutaneous Tissue): Yes N/A N/A Exposed Structures: Fascia: No Tendon: No Muscle: No Joint: No Bone: No Small (1-33%) N/A N/A Epithelialization: Debridement - Excisional N/A N/A Debridement: Pre-procedure Verification/Time Out 08:05 N/A N/A Taken: Lidocaine 4% Topical Solution N/A N/A Pain Control: Callus, Subcutaneous, Slough N/A N/A Tissue Debrided: Skin/Subcutaneous Tissue N/A N/A Level: 1.13 N/A N/A Debridement A (sq cm): rea Curette N/A N/A Instrument: Minimum N/A N/A Bleeding: Pressure N/A N/A Hemostasis A chieved: 0 N/A N/A Procedural Pain: 0 N/A N/A Post Procedural Pain: Procedure was tolerated well N/A N/A Debridement Treatment  Response: 0.8x1.2x0.4 N/A N/A Post Debridement Measurements L x W x D (cm) 0.302 N/A N/A Post Debridement Volume: (cm) Callus: Yes N/A N/A Periwound Skin Texture: Maceration: No N/A N/A Periwound Skin Moisture: Dry/Scaly: No No Abnormalities Noted N/A N/A Periwound Skin Color: No Abnormality N/A N/A Temperature: Cellular or Tissue Based Product N/A N/A Procedures Performed: Debridement Treatment Notes Wound #4 (Foot) Wound Laterality: Right, Lateral, Distal Cleanser Soap and Water Discharge Instruction: May shower and wash wound with dial antibacterial soap and water prior to dressing change. Wound Cleanser Discharge Instruction: Cleanse the wound with wound cleanser prior to applying a clean dressing using gauze sponges, not tissue or cotton balls. Peri-Wound Care Topical Primary Dressing ADAPTIC TOUCH 3x4.25 ([name]/[name]) Discharge Instruction: Apply to wound bed as instructed Epicord #3 Secondary Dressing Woven Gauze Sponge, Non-Sterile 4x4 [name] Discharge Instruction: Apply over primary dressing as directed. Secured With Paper Tape, 2x10 ([name]/yd) Discharge Instruction: Secure dressing with tape as directed. Compression Wrap Kerlix Roll 4.5x3.1 ([name]/yd) Discharge Instruction: Apply Kerlix and Coban compression as directed. [name], [name] ([id number]) [id number] Page 4 of 8 Compression Stockings Add-Ons Electronic Signature(s) Signed: 07/07/2022 8:32:46 AM By: Duanne Guess MD FACS Entered By: Duanne Guess on 07/07/2022 08:32:46 -------------------------------------------------------------------------------- Multi-Disciplinary Care Plan Details Patient Name: Date of Service: [name] 07/07/2022 7:30 [name] Medical Record Number: [id number] Patient Account Number: [id number] Date of Birth/Sex: Treating RN: 03-Nov-1970 (52 y.o. [name] Primary Care Ashantia Amaral: [name] Other Clinician: Referring Jaquasia Doscher: Treating  Marlea Gambill/Extender: [name] [name] Multidisciplinary Care Plan reviewed with physician Active Inactive Nutrition Nursing Diagnoses: Impaired glucose control: actual or potential Potential for alteratiion [name] Nutrition/Potential for imbalanced nutrition Goals: Patient/caregiver will maintain therapeutic glucose control Date Initiated: 07/29/2021 Target Resolution Date: 08/20/2022 Goal Status: Active Interventions: Assess patient nutrition upon admission and as needed per policy Provide education on elevated blood sugars and impact on wound healing Treatment Activities: Patient referred to Primary Care Physician for further nutritional evaluation : 07/29/2021 Notes: Wound/Skin Impairment Nursing Diagnoses: Impaired tissue integrity Goals: Patient/caregiver will verbalize understanding of skin care regimen Date Initiated: 07/04/2021 Target Resolution Date: 08/04/2022 Goal Status: Active Interventions: Assess ulceration(s) every visit Treatment Activities: Skin care regimen initiated : 07/04/2021 Notes: Electronic Signature(s) Signed: 07/07/2022 5:12:02 PM By: [name] Deed RN, BSN Entered By: [name] on 07/07/2022 07:58:57 [name], [name] ([id number]) [id number] Page 5 of 8 -------------------------------------------------------------------------------- Pain Assessment Details Patient Name: Date of  Service: [name] 07/07/2022 7:30 [name] Medical Record Number: [id number] Patient Account Number: [id number] Date of Birth/Sex: Treating RN: [birth year] (52 y.o. [name] Primary Care Sam Overbeck: [name] Other Clinician: Referring Aeon Koors: Treating Talah Cookston/Extender: [name] [name] Active Problems Location of Pain Severity and Description of Pain Patient Has Paino No Site Locations Rate the pain. Current Pain Level: 0 Pain Management and  Medication Current Pain Management: Electronic Signature(s) Signed: 07/07/2022 5:12:02 PM By: [name] Deed RN, BSN Entered By: [name] on 07/07/2022 07:48:38 -------------------------------------------------------------------------------- Patient/Caregiver Education Details Patient Name: Date of Service: [name], [name] 6/17/2024andnbsp7:30 [name] Medical Record Number: [id number] Patient Account Number: [id number] Date of Birth/Gender: Treating RN: 03/24/1970 (52 y.o. [name] Primary Care Physician: [name] Other Clinician: Referring Physician: Treating Physician/Extender: [name] [name] Treatment: 42 Education Assessment Education Provided To: Patient Education Topics Provided Offloading: Methods: Explain/Verbal Responses: Reinforcements needed, State content correctly [name], [name] ([id number]) [id number] Page 6 of 8 Wound/Skin Impairment: Methods: Explain/Verbal Responses: Reinforcements needed, State content correctly Electronic Signature(s) Signed: 07/07/2022 5:12:02 PM By: [name] Deed RN, BSN Entered By: [name] on 07/07/2022 07:59:26 -------------------------------------------------------------------------------- Wound Assessment Details Patient Name: Date of Service: [name] 07/07/2022 7:30 [name] Medical Record Number: [id number] Patient Account Number: [id number] Date of Birth/Sex: Treating RN: Jun 14, 1970 (52 y.o. [name] Primary Care Jasmeet Manton: [name] Other Clinician: Referring Thi Sisemore: Treating Wynema Garoutte/Extender: [name] [name] Wound Status Wound Number: 4 Primary Etiology: Diabetic Wound/Ulcer of the Lower Extremity Wound Location: Right, Distal, Lateral Foot Wound Status: Open Wounding Event: Blister Comorbid History: Type II Diabetes Date Acquired: 05/06/2022 Weeks Of Treatment: 8 Clustered  Wound: No Photos Wound Measurements Length: (cm) 0.8 Width: (cm) 1.2 Depth: (cm) 0.5 Area: (cm) 0.754 Volume: (cm) 0.377 % Reduction [name] Area: [name]% % Reduction [name] Volume: -140.1% Epithelialization: Small (1-33%) Tunneling: No Undermining: No Wound Description Classification: Grade 1 Wound Margin: Distinct, outline attached Exudate Amount: Medium Exudate Type: Serosanguineous Exudate Color: red, brown Foul Odor After Cleansing: No Slough/Fibrino Yes Wound Bed Granulation Amount: Large (67-100%) Exposed Structure Granulation Quality: Red Fascia Exposed: No Necrotic Amount: Small (1-33%) Fat Layer (Subcutaneous Tissue) Exposed: Yes Necrotic Quality: Adherent Slough Tendon Exposed: No Muscle Exposed: No Joint Exposed: No Bone Exposed: No [address] [name], [name] ([id number]) [id number] Page 7 of 8 No Abnormalities Noted: No No Abnormalities Noted: Yes Callus: Yes Temperature / Pain Temperature: No Abnormality Moisture No Abnormalities Noted: No Dry / Scaly: No Maceration: No Treatment Notes Wound #4 (Foot) Wound Laterality: Right, Lateral, Distal Cleanser Soap and Water Discharge Instruction: May shower and wash wound with dial antibacterial soap and water prior to dressing change. Wound Cleanser Discharge Instruction: Cleanse the wound with wound cleanser prior to applying a clean dressing using gauze sponges, not tissue or cotton balls. Peri-Wound Care Topical Primary Dressing ADAPTIC TOUCH 3x4.25 ([name]/[name]) Discharge Instruction: Apply to wound bed as instructed Epicord #3 Secondary Dressing Woven Gauze Sponge, Non-Sterile 4x4 [name] Discharge Instruction: Apply over primary dressing as directed. Secured With Paper Tape, 2x10 ([name]/yd) Discharge Instruction: Secure dressing with tape as directed. Compression Wrap Kerlix Roll 4.5x3.1 ([name]/yd) Discharge Instruction: Apply Kerlix and Coban compression as  directed. Compression Stockings Add-Ons Electronic Signature(s) Signed: 07/07/2022 5:12:02 PM By: [name] Deed RN, BSN Entered By: [name] on 07/07/2022 07:57:46 -------------------------------------------------------------------------------- Vitals Details Patient Name: Date of Service: [name] [name], [name]  [name] 07/07/2022 7:30 [name] Medical Record Number: [id number] Patient Account Number: [id number] Date of Birth/Sex: Treating RN: Jul 30, 1970 (52 y.o. [name] Primary Care Artasia Thang: [name] Other Clinician: Referring Tavoris Brisk: Treating Audiel Scheiber/Extender: [name] [name] Vital Signs Time Taken: 07:47 Temperature (F): 98.6 Height ([name]): 72 Pulse (bpm): 76 Weight (lbs): 312 Respiratory Rate (breaths/min): 18 Body Mass Index (BMI): 42.3 Blood Pressure (mmHg): 161/93 Capillary Blood Glucose (mg/dl): 409 Reference Range: 80 - 120 mg / dl Notes glucose per pt report this am [name], [name] ([id number]) [phone].pdf Page 8 of 8 Electronic Signature(s) Signed: 07/07/2022 5:12:02 PM By: [name] Deed RN, BSN Entered By: [name] on 07/07/2022 07:48:23

## 2022-07-09 ENCOUNTER — Other Ambulatory Visit: Payer: Self-pay | Admitting: Family Medicine

## 2022-07-21 ENCOUNTER — Encounter (HOSPITAL_BASED_OUTPATIENT_CLINIC_OR_DEPARTMENT_OTHER): Payer: BLUE CROSS/BLUE SHIELD | Attending: General Surgery | Admitting: General Surgery

## 2022-07-21 DIAGNOSIS — M86671 Other chronic osteomyelitis, right ankle and foot: Secondary | ICD-10-CM | POA: Insufficient documentation

## 2022-07-21 DIAGNOSIS — E1149 Type 2 diabetes mellitus with other diabetic neurological complication: Secondary | ICD-10-CM | POA: Diagnosis not present

## 2022-07-21 DIAGNOSIS — I10 Essential (primary) hypertension: Secondary | ICD-10-CM | POA: Diagnosis not present

## 2022-07-21 DIAGNOSIS — E11621 Type 2 diabetes mellitus with foot ulcer: Secondary | ICD-10-CM | POA: Insufficient documentation

## 2022-07-21 DIAGNOSIS — L97515 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 without evidence of necrosis: Secondary | ICD-10-CM | POA: Diagnosis present

## 2022-07-21 NOTE — Progress Notes (Signed)
[name] ([id number]) [id number] Page 1 of 8 Visit Report for 07/21/2022 Arrival Information Details Patient Name: Date of Service: [name], [name] 07/21/2022 8:30 [name] Medical Record Number: [id number] Patient Account Number: [id number] Date of Birth/Sex: Treating RN: 1970-04-24 (52 y.o. [name] Primary Care [name]: [name] Other Clinician: Referring [name]: Treating [name]/Extender: [name] [name]: 66 Visit Information History Since Last Visit Added or deleted any medications: No Patient Arrived: Ambulatory Any new allergies or adverse reactions: No Arrival Time: 08:37 Had a fall or experienced change [name] No Accompanied By: self activities of daily living that may affect Transfer Assistance: None risk of falls: Patient Identification Verified: Yes Signs or symptoms of abuse/neglect since last visito No Secondary Verification Process Completed: Yes Hospitalized since last visit: No Patient Requires Transmission-Based Precautions: No Implantable device outside of the clinic excluding No Patient Has Alerts: Yes cellular tissue based products placed [name] the center since last visit: Has Dressing [name] Place as Prescribed: Yes Pain Present Now: No Electronic Signature(s) Signed: 07/21/2022 3:44:30 PM By: [name] Entered By: [name] on 07/21/2022 08:38:09 -------------------------------------------------------------------------------- Encounter Discharge Information Details Patient Name: Date of Service: [name], [name] 07/21/2022 8:30 [name] Medical Record Number: [id number] Patient Account Number: [id number] Date of Birth/Sex: Treating RN: 1970/10/09 (52 y.o. [name] Primary Care [name]: [name] Other Clinician: Referring [name]: Treating [name]/Extender: [name] [name]: 70 Encounter Discharge Information  Items Post Procedure Vitals Discharge Condition: Stable Temperature (F): 99.2 Ambulatory Status: Ambulatory Pulse (bpm): 92 Discharge Destination: Home Respiratory Rate (breaths/min): 18 Transportation: Private Auto Blood Pressure (mmHg): 147/76 Accompanied By: self Schedule Follow-up Appointment: Yes Clinical Summary of Care: Patient Declined Electronic Signature(s) Signed: 07/21/2022 3:44:30 PM By: [name] Entered By: [name] on 07/21/2022 09:17:56 [name] ([id number]) [id number] Page 2 of 8 -------------------------------------------------------------------------------- Lower Extremity Assessment Details Patient Name: Date of Service: [name], [name] 07/21/2022 8:30 [name] Medical Record Number: [id number] Patient Account Number: [id number] Date of Birth/Sex: Treating RN: 10-31-1970 (52 y.o. [name] Primary Care [name]: [name] Other Clinician: Referring [name]: Treating [name]/Extender: [name] [name]: 54 Edema Assessment Assessed: [name]: No] [name]: No] Edema: [Left: N] [Right: o] Calf Left: Right: Point of Measurement: From Medial Instep 40 cm Ankle Left: Right: Point of Measurement: From Medial Instep 24 cm Electronic Signature(s) Signed: 07/21/2022 3:44:30 PM By: [name] Entered By: [name] on 07/21/2022 08:38:27 -------------------------------------------------------------------------------- Multi Wound Chart Details Patient Name: Date of Service: [name], [name] 07/21/2022 8:30 [name] Medical Record Number: [id number] Patient Account Number: [id number] Date of Birth/Sex: Treating RN: [birth year] (51 y.o. [name] Primary Care [name]: [name] Other Clinician: Referring [name]: Treating [name]/Extender: [name] [name]: 54 Vital Signs Height([name]: 72 Capillary Blood Glucose(mg/dl):  016 Weight(lbs): 010 Pulse(bpm): 92 Body Mass Index(BMI): 42.3 Blood Pressure(mmHg): 147/76 Temperature(F): 99.2 Respiratory Rate(breaths/min): 18 [4:Photos:] [N/A:N/A] Right, Distal, Lateral Foot N/A N/A Wound Location: Blister N/A N/A Wounding Event: Diabetic Wound/Ulcer of the Lower N/A N/A Primary Etiology: Extremity Type II Diabetes N/A N/A Comorbid History: 05/06/2022 N/A N/A Date Acquired: [name] ([id number]) [id number] Page 3 of 8 10 N/A N/A Weeks of Treatment: Open N/A N/A Wound Status: No N/A N/A Wound Recurrence: 0.6x0.7x0.6 N/A N/A Measurements L x W x D (cm) 0.33 N/A N/A A (cm) : rea 0.198 N/A N/A Volume (cm) : 79.00% N/A N/A %  Reduction [name] A rea: -26.10% N/A N/A % Reduction [name] Volume: 12 Starting Position 1 (o'clock): 12 Ending Position 1 (o'clock): 1.3 Maximum Distance 1 (cm): Yes N/A N/A Undermining: Grade 1 N/A N/A Classification: Medium N/A N/A Exudate A mount: Serosanguineous N/A N/A Exudate Type: red, brown N/A N/A Exudate Color: Distinct, outline attached N/A N/A Wound Margin: Large (67-100%) N/A N/A Granulation A mount: Red N/A N/A Granulation Quality: Small (1-33%) N/A N/A Necrotic A mount: Fat Layer (Subcutaneous Tissue): Yes N/A N/A Exposed Structures: Fascia: No Tendon: No Muscle: No Joint: No Bone: No Small (1-33%) N/A N/A Epithelialization: Debridement - Selective/Open Wound N/A N/A Debridement: Pre-procedure Verification/Time Out 08:58 N/A N/A Taken: Lidocaine 4% Topical Solution N/A N/A Pain Control: Callus N/A N/A Tissue Debrided: Non-Viable Tissue N/A N/A Level: 0.33 N/A N/A Debridement A (sq cm): rea Curette N/A N/A Instrument: Minimum N/A N/A Bleeding: Pressure N/A N/A Hemostasis A chieved: Procedure was tolerated well N/A N/A Debridement Treatment Response: 0.6x0.7x0.6 N/A N/A Post Debridement Measurements L x W x D (cm) 0.198 N/A N/A Post Debridement  Volume: (cm) Callus: Yes N/A N/A Periwound Skin Texture: Maceration: No N/A N/A Periwound Skin Moisture: Dry/Scaly: No No Abnormalities Noted N/A N/A Periwound Skin Color: No Abnormality N/A N/A Temperature: Debridement N/A N/A Procedures Performed: Treatment Notes Wound #4 (Foot) Wound Laterality: Right, Lateral, Distal Cleanser Soap and Water Discharge Instruction: May shower and wash wound with dial antibacterial soap and water prior to dressing change. Wound Cleanser Discharge Instruction: Cleanse the wound with wound cleanser prior to applying a clean dressing using gauze sponges, not tissue or cotton balls. Peri-Wound Care Topical Primary Dressing Hydrofera Blue Ready Transfer Foam, 2.5x2.5 ([name]/[name] Discharge Instruction: Apply directly to wound bed as directed Secondary Dressing Optifoam Non-Adhesive Dressing, 4x4 [name] Discharge Instruction: Apply over primary dressing as directed. Woven Gauze Sponge, Non-Sterile 4x4 [name] Discharge Instruction: Apply over primary dressing as directed. Secured With Paper Tape, 2x10 ([name]/yd) Discharge Instruction: Secure dressing with tape as directed. Compression Wrap Kerlix Roll 4.5x3.1 ([name]/yd) Discharge Instruction: Apply Kerlix and Coban compression as directed. [name], Sidhant [name] ([id number]) [id number] Page 4 of 8 Compression Stockings Add-Ons Electronic Signature(s) Signed: 07/21/2022 9:18:48 AM By: Duanne Guess MD FACS Entered By: Duanne Guess on 07/21/2022 09:18:48 -------------------------------------------------------------------------------- Multi-Disciplinary Care Plan Details Patient Name: Date of Service: [name], [name] 07/21/2022 8:30 [name] Medical Record Number: [id number] Patient Account Number: [id number] Date of Birth/Sex: Treating RN: 1971/01/03 (52 y.o. [name] Primary Care Kambri Dismore: [name] Other Clinician: Referring Mikaella Escalona: Treating Helana Macbride/Extender: [name] [name]: 65 Multidisciplinary Care Plan reviewed with physician Active Inactive Nutrition Nursing Diagnoses: Impaired glucose control: actual or potential Potential for alteratiion [name] Nutrition/Potential for imbalanced nutrition Goals: Patient/caregiver will maintain therapeutic glucose control Date Initiated: 07/29/2021 Target Resolution Date: 08/20/2022 Goal Status: Active Interventions: Assess patient nutrition upon admission and as needed per policy Provide education on elevated blood sugars and impact on wound healing Treatment Activities: Patient referred to Primary Care Physician for further nutritional evaluation : 07/29/2021 Notes: Wound/Skin Impairment Nursing Diagnoses: Impaired tissue integrity Goals: Patient/caregiver will verbalize understanding of skin care regimen Date Initiated: 07/04/2021 Target Resolution Date: 08/04/2022 Goal Status: Active Interventions: Assess ulceration(s) every visit Treatment Activities: Skin care regimen initiated : 07/04/2021 Notes: Electronic Signature(s) Signed: 07/21/2022 3:44:30 PM By: [name] Entered By: [name] on 07/21/2022 09:02:59 [name], Hiroki [name] ([id number]) [id number] Page 5 of 8 -------------------------------------------------------------------------------- Pain Assessment Details Patient Name: Date of Service: [name], [name] 07/21/2022  8:30 [name] Medical Record Number: [id number] Patient Account Number: [id number] Date of Birth/Sex: Treating RN: 10/12/1970 (52 y.o. [name] Primary Care Serin Thornell: [name] Other Clinician: Referring Sebastien Jackson: Treating Shawonda Kerce/Extender: [name] [name]: 43 Active Problems Location of Pain Severity and Description of Pain Patient Has Paino No Site Locations Rate the pain. Current Pain Level: 0 Pain Management and Medication Current Pain  Management: Electronic Signature(s) Signed: 07/21/2022 3:44:30 PM By: [name] Entered By: [name] on 07/21/2022 08:38:23 -------------------------------------------------------------------------------- Patient/Caregiver Education Details Patient Name: Date of Service: Oletta Darter, [name] 7/1/2024andnbsp8:30 [name] Medical Record Number: [id number] Patient Account Number: [id number] Date of Birth/Gender: Treating RN: 06-10-1970 (52 y.o. [name] Primary Care Physician: [name] Other Clinician: Referring Physician: Treating Physician/Extender: [name] [name]: 74 Education Assessment Education Provided To: Patient Education Topics Provided Wound/Skin Impairment: Methods: Explain/Verbal Responses: Reinforcements needed, State content correctly [name], Shneur [name] ([id number]) [id number] Page 6 of 8 Electronic Signature(s) Signed: 07/21/2022 3:44:30 PM By: [name] By: [name] on 07/21/2022 09:03:33 -------------------------------------------------------------------------------- Wound Assessment Details Patient Name: Date of Service: [name], [name] 07/21/2022 8:30 [name] Medical Record Number: [id number] Patient Account Number: [id number] Date of Birth/Sex: Treating RN: [birth year] (52 y.o. [name] Primary Care Angeliki Mates: [name] Other Clinician: Referring Ronnetta Currington: Treating Daemien Fronczak/Extender: [name] [name]: 54 Wound Status Wound Number: 4 Primary Etiology: Diabetic Wound/Ulcer of the Lower Extremity Wound Location: Right, Distal, Lateral Foot Wound Status: Open Wounding Event: Blister Comorbid History: Type II Diabetes Date Acquired: 05/06/2022 Weeks Of Treatment: 10 Clustered Wound: No Photos Wound Measurements Length: (cm) 0.6 Width: (cm) 0.7 Depth: (cm) 0.6 Area: (cm) 0.33 Volume: (cm)  0.198 % Reduction [name] Area: 79% % Reduction [name] Volume: -26.1% Epithelialization: Small (1-33%) Undermining: Yes Starting Position (o'clock): 12 Ending Position (o'clock): 12 Maximum Distance: (cm) 1.3 Wound Description Classification: Grade 1 Wound Margin: Distinct, outline attached Exudate Amount: Medium Exudate Type: Serosanguineous Exudate Color: red, brown Foul Odor After Cleansing: No Slough/Fibrino Yes Wound Bed Granulation Amount: Large (67-100%) Exposed Structure Granulation Quality: Red Fascia Exposed: No Necrotic Amount: Small (1-33%) Fat Layer (Subcutaneous Tissue) Exposed: Yes Necrotic Quality: Adherent Slough Tendon Exposed: No Muscle Exposed: No Joint Exposed: No Bone Exposed: No Periwound Skin Texture Texture Color No Abnormalities Noted: No No Abnormalities Noted: Yes Callus: Yes [name], Johnthan [name] ([id number]) [id number] Page 7 of 8 Callus: Yes Temperature / Pain Temperature: No Abnormality Moisture No Abnormalities Noted: No Dry / Scaly: No Maceration: No Treatment Notes Wound #4 (Foot) Wound Laterality: Right, Lateral, Distal Cleanser Soap and Water Discharge Instruction: May shower and wash wound with dial antibacterial soap and water prior to dressing change. Wound Cleanser Discharge Instruction: Cleanse the wound with wound cleanser prior to applying a clean dressing using gauze sponges, not tissue or cotton balls. Peri-Wound Care Topical Primary Dressing Hydrofera Blue Ready Transfer Foam, 2.5x2.5 ([name]/[name] Discharge Instruction: Apply directly to wound bed as directed Secondary Dressing Optifoam Non-Adhesive Dressing, 4x4 [name] Discharge Instruction: Apply over primary dressing as directed. Woven Gauze Sponge, Non-Sterile 4x4 [name] Discharge Instruction: Apply over primary dressing as directed. Secured With Paper Tape, 2x10 ([name]/yd) Discharge Instruction: Secure dressing with tape as directed. Compression Wrap Kerlix  Roll 4.5x3.1 ([name]/yd) Discharge Instruction: Apply Kerlix and Coban compression as directed. Compression Stockings Add-Ons Electronic Signature(s) Signed: 07/21/2022 3:44:30 PM By: [name] Entered By: [name] on 07/21/2022 09:13:24 -------------------------------------------------------------------------------- Vitals Details Patient Name: Date of  Service: [name], [name] 07/21/2022 8:30 [name] Medical Record Number: [id number] Patient Account Number: [id number] Date of Birth/Sex: Treating RN: Jul 06, 1970 (52 y.o. [name] Primary Care Blondine Hottel: [name] Other Clinician: Referring Khaza Blansett: Treating Neno Hohensee/Extender: [name] [name]: 78 Vital Signs Time Taken: 08:38 Temperature (F): 99.2 Height ([name]: 72 Pulse (bpm): 92 Weight (lbs): 312 Respiratory Rate (breaths/min): 18 Body Mass Index (BMI): 42.3 Blood Pressure (mmHg): 147/76 Capillary Blood Glucose (mg/dl): 409 Reference Range: 80 - 120 mg / dl Electronic Signature(s) Signed: 07/21/2022 3:44:30 PM By: Herrington, Taylor [name], [place] 07/21/2022 3:44:30 PM By: Darcella Gasman SignedJudie Petit ([id number]) [id number] Page 8 of 8 aylor Entered By: [name] on 07/21/2022 08:38:57

## 2022-07-21 NOTE — Progress Notes (Signed)
[name], [name] ([id number]) [id number] Page 1 of 11 Visit Report for 07/21/2022 Chief Complaint Document Details Patient Name: Date of Service: [name], [name] 07/21/2022 8:30 [name] Medical Record Number: [id number] Patient Account Number: [id number] Date of Birth/Sex: Treating RN: 1970-01-25 (52 y.o. [name] Primary Care Provider: Tana [name] Other Clinician: Referring Provider: Treating Provider/Extender: [name] in Treatment: 33 Information Obtained from: Patient Chief Complaint Patients presents for treatment of an open diabetic ulcer Electronic Signature(s) Signed: 07/21/2022 9:18:55 AM By: [name] Guess MD FACS Entered By: [name] on 07/21/2022 09:18:55 -------------------------------------------------------------------------------- Debridement Details Patient Name: Date of Service: [name], [name] 07/21/2022 8:30 [name] Medical Record Number: [id number] Patient Account Number: [id number] Date of Birth/Sex: Treating RN: 1970/02/20 (52 y.o. [name] Primary Care Provider: Tana [name] Other Clinician: Referring Provider: Treating Provider/Extender: [name] in Treatment: 54 Debridement Performed for Assessment: Wound #4 Right,Distal,Lateral Foot Performed By: Physician [name] Guess, MD Debridement Type: Debridement Severity of Tissue Pre Debridement: Fat layer exposed Level of Consciousness (Pre-procedure): Awake and Alert Pre-procedure Verification/Time Out Yes - 08:58 Taken: Start Time: 08:58 Pain Control: Lidocaine 4% Topical Solution Percent of Wound Bed Debrided: 100% T Area Debrided (cm): otal 0.33 Tissue and other material debrided: Non-Viable, Callus Level: Non-Viable Tissue Debridement Description: Selective/Open Wound Instrument: Curette Bleeding: Minimum Hemostasis Achieved: Pressure Response to Treatment: Procedure was tolerated well Level of  Consciousness (Post- Awake and Alert procedure): Post Debridement Measurements of Total Wound Length: (cm) 0.6 Width: (cm) 0.7 Depth: (cm) 0.6 Volume: (cm) 0.198 Character of Wound/Ulcer Post Debridement: Improved Severity of Tissue Post Debridement: Fat layer exposed Post Procedure Diagnosis [name], [name] ([id number]) [id number] Page 2 of 11 Same as Pre-procedure Notes scribed for Dr. Lady [name] by [name] Bruin, RN Electronic Signature(s) Signed: 07/21/2022 10:09:47 AM By: [name] Guess MD FACS Signed: 07/21/2022 3:44:30 PM By: [name] By: [name] on 07/21/2022 09:02:14 -------------------------------------------------------------------------------- HPI Details Patient Name: Date of Service: [name], [name] 07/21/2022 8:30 [name] Medical Record Number: [id number] Patient Account Number: [id number] Date of Birth/Sex: Treating RN: 1970/12/17 (52 y.o. 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 The most recent amputation was in August 2022. At that operation, antibiotic beads were placed in the wound. He has been managed by podiatry for his procedures and management of his wounds. He has been in a [facility], water quality. He is on oral doxycycline. They have been using Betadine wet to dry dressings along with Iodosorb. The patient states that when he thinks the wound is getting too dry, he applies topical Neosporin. At his last visit, on June 7 of this year, the podiatrist determined that he felt the wound was stalled and referred him to wound care for additional evaluation  and management. An MRI has been ordered, but not yet scheduled or performed. Pathology from his operation in August 2022 demonstrated findings consistent with chronic osteomyelitis. Today, there is a large irregular wound on the plantar surface of his right foot, at about the level of the fifth metatarsal head. This tracks through to a pinpoint opening on the dorsal lateral portion of his foot. The intake nurse reported purulent drainage.  There is some malodor from the wound. No frank necrosis identified. 07/11/2021: Today, the wounds do not connect. I attempted multiple times from various directions and the shared tunnel is no longer open. He has some slough accumulation on the dorsal part of his foot as well as slough and callus buildup on the plantar surface. His MRI is scheduled for June 29. No purulent drainage or malodor appreciated today. 07/19/2021: The lateral foot wound has closed and there is no tunnel connecting the plantar foot wound to that site. The plantar foot wound still probes quite deeply, however. There is some slough, eschar, and nonviable tissue accumulated in the wound bed. No malodorous drainage present. His MRI was performed yesterday and is consistent with fairly extensive osteomyelitis. 07/29/2021: The patient has an appointment with infectious disease on July 18 to treat his osteomyelitis. The plantar wound still probes quite deeply, approaching bone. The orifice has narrowed quite substantially, however, making it more difficult for him to pack. 08/05/2021: The tunnel connecting the lateral foot wound to the plantar foot wound has reopened. He sees infectious disease tomorrow to discuss long-term antibiotic treatment for osteomyelitis. There was a bit of murky drainage in the wound, but this was noted after he had had topical lidocaine applied so may have just been a blob of the anesthetic. No odor or frank purulent drainage. The wound probes deeply at the midfoot  approaching bone. He does have MRI results confirming his diagnosis of osteomyelitis. He has failed to progress with conventional treatment and I think his best chance for preservation of the foot is to initiate hyperbaric oxygen therapy. 08/13/2021: The tunnel connecting the 2 wounds has closed again. He has a PICC line and is getting IV daptomycin and cefepime. EKG and chest x-ray are within normal limits. He is scheduled to start hyperbaric oxygen therapy tomorrow. The wound in his midfoot probes deeply, approaching bone. The skin at the orifice continues to heaped up and threatens to close over despite the large cavity within. No erythema, induration, or purulent drainage. The wound on his lateral foot is fairly small and quite clean. 08/19/2021: The lateral foot wound has nearly closed. The wound in his midfoot does not probe quite as deeply today. The skin at the orifice continues to try to roll in and obscure the opening. No frankly necrotic tissue appreciated. He is tolerating hyperbaric oxygen therapy. 08/26/2021: The lateral foot wound has closed completely. The wound in his midfoot is shallower again today. The wound orifice is contracting. We are using gentamicin and silver alginate. He continues on IV daptomycin and cefepime and is tolerating hyperbaric oxygen therapy without difficulty. 09/02/2021: His foot is a little bit sore today but he was up walking on it all weekend doing back to school shopping with his daughter. The tunneling is down to about 3 cm and I do not appreciate bone at the tip of the probe. The wound is clean but has some callus creating an overhanging lip at the distal aspect. He continues to receive hyperbaric oxygen therapy as well as IV daptomycin and cefepime. 09/09/2021: His wound continues to improve. The tunnel has come in by 0.9 cm. He continues to form callus around the orifice of the wound. He is tolerating hyperbaric oxygen therapy along with his IV  antibiotics. 09/16/2021: The tunneling is down to just half a centimeter. He continues to build up callus around the orifice of the wound. He completed his course of IV antibiotics and his PICC line is scheduled  to be removed this Friday. He is tolerating hyperbaric oxygen without difficulty. We have been applying topical gentamicin and silver alginate to his wound. 09/24/2021: The wound depth has come in even further. There is a little bit of callus buildup around the orifice. The opening is quite narrow, at this point. [name], [name] ([id number]) [id number] Page 3 of 11 09/30/2021: The wound depth is about the same this week. The opening continues to contract with callus accumulation. There is some discoloration of the skin on the lateral part of his foot and he admitted to walking more than usual this weekend and also wearing a different pair shoes. He continues to tolerate hyperbaric oxygen therapy without difficulty. 10/07/2021: The wound depth has contracted a bit and is now approximately 1 to 1.5 cm. The orifice of the wound continues to try and close in over the space. There is just some callus and skin around the margins obscuring the orifice. 10/14/2021: The wound depth has come in a little bit more. The orifice of the wound is rolling inwards with epithelium and callus, making it difficult to pack the wound. 10/21/2021: There is still 1 portion of the wound, at the lateral aspect, that is still a couple of centimeters deep. The architecture of the wound makes this somewhat challenging to access and pack. Everything else looks clean. He has his usual accumulation of callus and skin, narrowing the orifice. 10/29/2021: No significant change to the depth of the wound. The orifice continues to contract with callus and skin narrowing the opening. His wife has been packing the wound and doing an excellent job. 11/05/2021: The depth of the wound has come in by about a  centimeter. There is less callus accumulation around the orificedespite this, the wound is narrower today. We are still awaiting insurance approval for Kerecis powder. 11/12/2021: The depth of the wound continues to contract. He has accumulated some callus around the orifice, as usual. We have received a verbal confirmation that he is approved for Kerecis, but no formal paperwork has yet been received. 11/19/2021: The depth continues to come in. There is callus accumulation around the orifice, as usual. He has been formally approved for Kerecis, but unfortunately we do not have a billing mechanism for the powder in [employer] yet. We are working to address this so we can begin treatment. 11/25/2021: Continued filling in of the depth of the wound. Continued buildup of callus around the orifice. No concern for infection. As we do not have a way to bill for Kerecis powder, but are capable of billing for the [facility] sheet, we are going to use that on him instead. 12/03/2021: The depth of the wound continues to fill in. As usual, he has accumulated callus and skin around the orifice of the wound. No concern for infection. He will complete his hyperbaric oxygen therapy this week. 12/17/2021: The wound depth continues to contract. There is callus and skin accumulation around the orifice of the wound, as per usual. There is more epithelium on the actual wound surface. Unfortunately, his Darlen Round was not ordered. 12/24/2021: The wound depth has come in a little bit more this week. There is less skin accumulation around the orifice, but still some callus. On questioning, he admits to spending a fair amount of time on his feet. 12/12; this is a very difficult wound to assess size slitlike wound with some depth but minimal opening. I applied Kerecis No. 4 12/18; Kerecis No. 5 01/15/2022: His wound measured deeper today. There is  also some evidence of pressure induced tissue injury around the lateral aspect of his foot.  The patient reports that he was wearing different shoes than usual and felt like his foot was rolling a lot over the weekend. There is heavy callus around the orifice of the wound. No concern for infection. 01/21/2022: The depth decreased today from 1.7 to 1.0 cm. Significantly less tissue damage as compared to last week. Still with callus and skin accumulation around the orifice. 01/29/2022: The wound continues to contract and the depth is coming in further. We are still working on getting [employer] for him. 02/04/2022: No real change this week. Still working on the [employer]. 02/11/2022: No significant changes again. We are still working on the [employer]. 02/18/2022: No change to the wound. We have submitted in IVR for Epicord. 02/25/2022: Unfortunately, his wound has deteriorated over the past week. It is deeper and is approaching bone once again. There is more slough accumulation and the tissue shows signs of pressure injury. He says that he has been trying to wear his new work boots, which are waterproof. He says they make his feet sweat excessively. I think our main challenge with him is that we just are not able to adequately offload him while he is still working and on his feet through much of the day. He is willing to consider total contact casting, but will need to make some arrangements with work. On a more positive note, however, he has been approved for Epicord. 03/03/2022: The wound probed a little bit deeper again today. We are planning to put him in a total contact cast as soon as he has cleared it with work to take some time off. 03/10/2022: The wound was a little bit shallower today and the overall appearance is significantly improved. We are planning to put him in a cast today. 03/12/2022: He is here for his first total contact cast change. He denies any issues with pressure, rubbing, or pain. 03/17/2022: For some reason, the wound probes deeper today. There is some slough  and nonviable subcutaneous tissue around the opening. He denies any difficulty with the total contact cast. 03/24/2022: The wound is shallower today. There is better granulation tissue visible on the wound surface. 03/31/2022: His cast protector got a hole in it and his cast got wet. His foot is macerated. The wound measured a little deeper today. There is some hypertrophic granulation tissue at the orifice of his wound. 3/18; patient presents for follow-up. He had no issues with the total contact cast. We have been using epi cord to the wound bed. Overall wound is stable. Patient denies signs of infection. 04/14/2022: The depth of the wound has rather remarkably decreased to 1.3 cm today. The orifice of the wound has also contracted quite a bit. 04/21/2022: The depth of the wound has decreased even further. The orifice is also smaller. The wound is very clean. 04/28/2022: The depth of the wound is down to 0.5 cm. The orifice continues to contract. 05/06/2022: The primary wound is substantially smaller and shallower today. Unfortunately, he developed a blister distal to this on the plantar surface of his foot. The tissue breakdown underneath the blister does extend into the fat layer in 1 area over a bony prominence from his collapsed arch. The remainder of the tissue underneath the blister is intact skin. 05/12/2022: The primary wound is down to just a couple of millimeters and is flush with the surrounding skin. The area that had blistered  last week had some [name], [name] ([id number]) [id number] Page 4 of 11 purulent drainage coming follow-up a corner that was underneath some dry skin. No malodor associated with this. There is some additional slough that has accumulated. 05/19/2022: His primary wound has actually healed. The area that opened after a blister formed is smaller. There is a little bit of thickened skin around the edges, no concern for infection. 05/26/2022: The  wound is stable. Unfortunately, for some reason the primary dressing was left off and there was no silver alginate applied. There was also no zinc oxide applied to the periwound so there is some tissue maceration present due to the fact that the only thing used on the wound was a foam donut, which retains the moisture from his foot. 06/02/2022: The wound measured 2 mm wider today. There is still some blue-green staining on the surface. 06/09/2022: No change to the wound dimensions. The culture that I took last week grew out Pseudomonas aeruginosa and Enterococcus faecalis. Levofloxacin was prescribed and he began taking this last Wednesday. 06/18/2022: He completed his levofloxacin yesterday. Today, the wound has not changed in its dimensions. There is callus accumulation around the wound and some slough on the wound surface. For what ever reason, we do not seem to be getting the offloading affect from the total contact cast that one might expect. 06/23/2022: The wound is a little bit smaller today. There is some undermining created by accumulated callus, but underneath the callus, there is epithelialization. Minimal slough on the surface. He has been approved for Epicord, so we will apply that today. He seems to be getting adequate offloading using the peg assist insert in his shoe. 06/30/2022: The wound is smaller today. There is no undermining. The wound surface looks healthier. Still with some periwound callus accumulation. 07/07/2022: The wound is about the same size. The center of the wound is fairly soft and mushy, however. 07/21/2022: He was out of town last week and apparently did quite a bit more walking than he had anticipated. As a result, opening the wound is smaller but he has developed significant undermining, roughly 1.5 cm nearly circumferentially. There is no malodor or purulent drainage. There is heavy callus accumulation around the wound. Electronic Signature(s) Signed: 07/21/2022 9:20:11  AM By: [name] Guess MD FACS Entered By: [name] on 07/21/2022 09:20:11 -------------------------------------------------------------------------------- Physical Exam Details Patient Name: Date of Service: [name], [name] 07/21/2022 8:30 [name] Medical Record Number: [id number] Patient Account Number: [id number] Date of Birth/Sex: Treating RN: July 08, 1970 (52 y.o. [name] Primary Care Provider: Tana [name] Other Clinician: Referring Provider: Treating Provider/Extender: [name] in Treatment: 33 Constitutional Slightly hypertensive. . . . no acute distress. Respiratory Normal work of breathing on room air. Notes 07/21/2022: The opening of the wound is smaller but he has developed significant undermining, roughly 1.5 cm nearly circumferentially. There is no malodor or purulent drainage. There is heavy callus accumulation around the wound. Electronic Signature(s) Signed: 07/21/2022 9:21:11 AM By: [name] Guess MD FACS Entered By: [name] on 07/21/2022 09:21:11 -------------------------------------------------------------------------------- Physician Orders Details Patient Name: Date of Service: [name], [name] 07/21/2022 8:30 [name] Medical Record Number: [id number] Patient Account Number: [id number] Date of Birth/Sex: Treating RN: [birth year] (52 y.o. 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 - Dr. Lady [name] - room 2 Return A Anesthetic (In clinic) Topical Lidocaine 4% applied to wound bed Cellular or Tissue Based Products Cellular or Tissue Based Product Type: - Epicord #3 - ON HOLD 07/21/2022 Cellular or Tissue Based Product applied to wound bed, secured with steri-strips, cover with Adaptic or Mepitel. (DO NOT REMOVE). Bathing/ Shower/ Hygiene May shower and wash wound with soap and water. Off-Loading Open toe surgical shoe to: - with peg assist insert, put insert into work boot when working Wound Treatment Wound #4 - Foot Wound Laterality: Right, Lateral, Distal Cleanser: Soap and Water 1 x Per Day/30 Days Discharge Instructions: May shower and wash wound with dial antibacterial soap and water prior to dressing change. Cleanser: Wound Cleanser (Generic) 1 x Per Day/30 Days Discharge Instructions: Cleanse the wound with wound cleanser prior to applying a clean dressing using gauze sponges, not tissue or cotton balls. Prim Dressing: Hydrofera Blue Ready Transfer Foam, 2.5x2.5 (in/in) (DME) (Generic) 1 x Per Day/30 Days ary Discharge Instructions: Apply directly to wound bed as directed Secondary Dressing: Optifoam Non-Adhesive Dressing, 4x4 in (DME) (Generic) 1 x Per Day/30 Days Discharge Instructions: Apply over primary dressing as directed. Secondary Dressing: Woven Gauze Sponge, Non-Sterile 4x4 in 1 x Per Day/30 Days Discharge Instructions: Apply over primary dressing as directed. Secured With: Paper Tape, 2x10 (in/yd) (Generic) 1 x Per Day/30 Days Discharge Instructions: Secure dressing with tape as directed. Compression Wrap: Kerlix Roll 4.5x3.1 (in/yd) (Generic) 1 x Per Day/30 Days Discharge Instructions: Apply Kerlix and Coban compression as directed. Patient Medications llergies: No Known Allergies A Notifications Medication Indication Start End 07/21/2022 lidocaine DOSE topical 4 % cream - cream topical Electronic Signature(s) Signed: 07/21/2022 11:05:57  AM By: [name] Guess MD FACS Previous Signature: 07/21/2022 10:09:47 AM Version By: [name] Guess MD FACS Entered By: [name] on 07/21/2022 10:28:54 [name], [name] ([id number]) [id number] Page 6 of 11 -------------------------------------------------------------------------------- Problem List Details Patient Name: Date of Service: [name], [name] 07/21/2022 8:30 [name] Medical Record Number: [id number] Patient Account Number: [id number] Date of Birth/Sex: Treating RN: Oct 24, 1970 (52 y.o. 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 07/21/2022 8:30 [name] Medical Record Number: [id number] Patient Account Number: [id number] Date of Birth/Sex: Treating RN: 1970/04/26 (52 y.o. 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 The most recent amputation was in August 2022. At that operation, antibiotic beads were placed in the wound. He has been managed by podiatry for his procedures and management of his wounds. He has been in a Scientist, water quality. He is on oral doxycycline. They have been using Betadine wet to dry dressings along with Iodosorb. The patient states that when he thinks the wound is getting too dry, he applies topical Neosporin. At his last visit, on June 7 of this year, the podiatrist determined that he felt the wound was stalled and referred him to wound care for additional evaluation and management. An MRI has been ordered, but not yet scheduled or performed. Pathology from his operation in August 2022 demonstrated findings consistent with chronic osteomyelitis. [name], [name] ([id number]) [id number] Page 7 of 11 Today, there is a large irregular wound on the plantar surface of his right foot, at about the level of the fifth metatarsal head. This tracks through to a pinpoint opening on the dorsal lateral portion of his foot. The intake nurse reported purulent drainage. There is some malodor from the wound. No frank necrosis identified. 07/11/2021: Today, the wounds do not connect. I attempted multiple times from various directions and the shared tunnel is no longer open. He has some slough accumulation on the dorsal part of his foot as well as slough and callus buildup on the plantar surface. His MRI is scheduled for June 29. No purulent drainage or malodor appreciated today. 07/19/2021: The lateral foot wound has closed and there is no tunnel connecting the plantar foot wound to that site. The plantar foot  wound still probes quite deeply, however. There is some slough, eschar, and nonviable tissue accumulated in the wound bed. No malodorous drainage present. His MRI was performed yesterday and is consistent with fairly extensive osteomyelitis. 07/29/2021: The patient has an appointment with infectious disease on July 18 to treat his osteomyelitis. The plantar wound still probes quite deeply, approaching bone. The orifice has narrowed quite substantially, however, making it more difficult for him to pack. 08/05/2021: The tunnel connecting the lateral foot wound to the plantar foot wound has reopened. He sees infectious disease tomorrow to discuss long-term antibiotic treatment for osteomyelitis. There was a bit of murky drainage in the wound, but this was noted after he had had topical lidocaine applied so may have just been a blob of the anesthetic. No odor or frank purulent drainage. The wound probes deeply at the midfoot approaching bone. He does have MRI results confirming his diagnosis of osteomyelitis. He has failed to progress with conventional treatment and I think his best chance for preservation of the foot is to initiate hyperbaric oxygen therapy. 08/13/2021: The tunnel connecting the 2 wounds has closed again. He has a PICC line and is getting IV daptomycin and cefepime. EKG and chest x-ray are within normal limits. He is scheduled to start hyperbaric oxygen therapy tomorrow. The wound in his midfoot probes deeply, approaching bone. The skin at the orifice continues to heaped up and threatens to close over despite the large cavity within. No erythema, induration, or purulent drainage. The wound on his lateral foot is fairly small and quite clean. 08/19/2021: The lateral foot wound has nearly closed. The wound in his midfoot does not probe quite as deeply today. The skin at the orifice continues to try to roll  in and obscure the opening. No frankly necrotic tissue appreciated. He is tolerating  hyperbaric oxygen therapy. 08/26/2021: The lateral foot wound has closed completely. The wound in his midfoot is shallower again today. The wound orifice is contracting. We are using gentamicin and silver alginate. He continues on IV daptomycin and cefepime and is tolerating hyperbaric oxygen therapy without difficulty. 09/02/2021: His foot is a little bit sore today but he was up walking on it all weekend doing back to school shopping with his daughter. The tunneling is down to about 3 cm and I do not appreciate bone at the tip of the probe. The wound is clean but has some callus creating an overhanging lip at the distal aspect. He continues to receive hyperbaric oxygen therapy as well as IV daptomycin and cefepime. 09/09/2021: His wound continues to improve. The tunnel has come in by 0.9 cm. He continues to form callus around the orifice of the wound. He is tolerating hyperbaric oxygen therapy along with his IV antibiotics. 09/16/2021: The tunneling is down to just half a centimeter. He continues to build up callus around the orifice of the wound. He completed his course of IV antibiotics and his PICC line is scheduled to be removed this Friday. He is tolerating hyperbaric oxygen without difficulty. We have been applying topical gentamicin and silver alginate to his wound. 09/24/2021: The wound depth has come in even further. There is a little bit of callus buildup around the orifice. The opening is quite narrow, at this point. 09/30/2021: The wound depth is about the same this week. The opening continues to contract with callus accumulation. There is some discoloration of the skin on the lateral part of his foot and he admitted to walking more than usual this weekend and also wearing a different pair shoes. He continues to tolerate hyperbaric oxygen therapy without difficulty. 10/07/2021: The wound depth has contracted a bit and is now approximately 1 to 1.5 cm. The orifice of the wound continues to try and  close in over the space. There is just some callus and skin around the margins obscuring the orifice. 10/14/2021: The wound depth has come in a little bit more. The orifice of the wound is rolling inwards with epithelium and callus, making it difficult to pack the wound. 10/21/2021: There is still 1 portion of the wound, at the lateral aspect, that is still a couple of centimeters deep. The architecture of the wound makes this somewhat challenging to access and pack. Everything else looks clean. He has his usual accumulation of callus and skin, narrowing the orifice. 10/29/2021: No significant change to the depth of the wound. The orifice continues to contract with callus and skin narrowing the opening. His wife has been packing the wound and doing an excellent job. 11/05/2021: The depth of the wound has come in by about a centimeter. There is less callus accumulation around the orificedespite this, the wound is narrower today. We are still awaiting insurance approval for Kerecis powder. 11/12/2021: The depth of the wound continues to contract. He has accumulated some callus around the orifice, as usual. We have received a verbal confirmation that he is approved for Kerecis, but no formal paperwork has yet been received. 11/19/2021: The depth continues to come in. There is callus accumulation around the orifice, as usual. He has been formally approved for Kerecis, but unfortunately we do not have a billing mechanism for the powder in [employer] yet. We are working to address this so we can begin treatment.  11/25/2021: Continued filling in of the depth of the wound. Continued buildup of callus around the orifice. No concern for infection. As we do not have a way to bill for Kerecis powder, but are capable of billing for the [facility] sheet, we are going to use that on him instead. 12/03/2021: The depth of the wound continues to fill in. As usual, he has accumulated callus and skin around the orifice of the  wound. No concern for infection. He will complete his hyperbaric oxygen therapy this week. 12/17/2021: The wound depth continues to contract. There is callus and skin accumulation around the orifice of the wound, as per usual. There is more epithelium on the actual wound surface. Unfortunately, his [employer] was not ordered. 12/24/2021: The wound depth has come in a little bit more this week. There is less skin accumulation around the orifice, but still some callus. On questioning, he admits to spending a fair amount of time on his feet. 12/12; this is a very difficult wound to assess size slitlike wound with some depth but minimal opening. I applied Kerecis No. 4 12/18; Kerecis No. 5 01/15/2022: His wound measured deeper today. There is also some evidence of pressure induced tissue injury around the lateral aspect of his foot. The patient reports that he was wearing different shoes than usual and felt like his foot was rolling a lot over the weekend. There is heavy callus around the orifice of the wound. No concern for infection. 01/21/2022: The depth decreased today from 1.7 to 1.0 cm. Significantly less tissue damage as compared to last week. Still with callus and skin accumulation [name], [name] ([id number]) [id number] Page 8 of 11 around the orifice. 01/29/2022: The wound continues to contract and the depth is coming in further. We are still working on getting [employer] for him. 02/04/2022: No real change this week. Still working on the [employer]. 02/11/2022: No significant changes again. We are still working on the [employer]. 02/18/2022: No change to the wound. We have submitted in IVR for Epicord. 02/25/2022: Unfortunately, his wound has deteriorated over the past week. It is deeper and is approaching bone once again. There is more slough accumulation and the tissue shows signs of pressure injury. He says that he has been trying to wear his new work  boots, which are waterproof. He says they make his feet sweat excessively. I think our main challenge with him is that we just are not able to adequately offload him while he is still working and on his feet through much of the day. He is willing to consider total contact casting, but will need to make some arrangements with work. On a more positive note, however, he has been approved for Epicord. 03/03/2022: The wound probed a little bit deeper again today. We are planning to put him in a total contact cast as soon as he has cleared it with work to take some time off. 03/10/2022: The wound was a little bit shallower today and the overall appearance is significantly improved. We are planning to put him in a cast today. 03/12/2022: He is here for his first total contact cast change. He denies any issues with pressure, rubbing, or pain. 03/17/2022: For some reason, the wound probes deeper today. There is some slough and nonviable subcutaneous tissue around the opening. He denies any difficulty with the total contact cast. 03/24/2022: The wound is shallower today. There is better granulation tissue visible on the wound surface. 03/31/2022: His cast protector got a  hole in it and his cast got wet. His foot is macerated. The wound measured a little deeper today. There is some hypertrophic granulation tissue at the orifice of his wound. 3/18; patient presents for follow-up. He had no issues with the total contact cast. We have been using epi cord to the wound bed. Overall wound is stable. Patient denies signs of infection. 04/14/2022: The depth of the wound has rather remarkably decreased to 1.3 cm today. The orifice of the wound has also contracted quite a bit. 04/21/2022: The depth of the wound has decreased even further. The orifice is also smaller. The wound is very clean. 04/28/2022: The depth of the wound is down to 0.5 cm. The orifice continues to contract. 05/06/2022: The primary wound is substantially  smaller and shallower today. Unfortunately, he developed a blister distal to this on the plantar surface of his foot. The tissue breakdown underneath the blister does extend into the fat layer in 1 area over a bony prominence from his collapsed arch. The remainder of the tissue underneath the blister is intact skin. 05/12/2022: The primary wound is down to just a couple of millimeters and is flush with the surrounding skin. The area that had blistered last week had some purulent drainage coming follow-up a corner that was underneath some dry skin. No malodor associated with this. There is some additional slough that has accumulated. 05/19/2022: His primary wound has actually healed. The area that opened after a blister formed is smaller. There is a little bit of thickened skin around the edges, no concern for infection. 05/26/2022: The wound is stable. Unfortunately, for some reason the primary dressing was left off and there was no silver alginate applied. There was also no zinc oxide applied to the periwound so there is some tissue maceration present due to the fact that the only thing used on the wound was a foam donut, which retains the moisture from his foot. 06/02/2022: The wound measured 2 mm wider today. There is still some blue-green staining on the surface. 06/09/2022: No change to the wound dimensions. The culture that I took last week grew out Pseudomonas aeruginosa and Enterococcus faecalis. Levofloxacin was prescribed and he began taking this last Wednesday. 06/18/2022: He completed his levofloxacin yesterday. Today, the wound has not changed in its dimensions. There is callus accumulation around the wound and some slough on the wound surface. For what ever reason, we do not seem to be getting the offloading affect from the total contact cast that one might expect. 06/23/2022: The wound is a little bit smaller today. There is some undermining created by accumulated callus, but underneath the  callus, there is epithelialization. Minimal slough on the surface. He has been approved for Epicord, so we will apply that today. He seems to be getting adequate offloading using the peg assist insert in his shoe. 06/30/2022: The wound is smaller today. There is no undermining. The wound surface looks healthier. Still with some periwound callus accumulation. 07/07/2022: The wound is about the same size. The center of the wound is fairly soft and mushy, however. 07/21/2022: He was out of town last week and apparently did quite a bit more walking than he had anticipated. As a result, opening the wound is smaller but he has developed significant undermining, roughly 1.5 cm nearly circumferentially. There is no malodor or purulent drainage. There is heavy callus accumulation around the wound. Patient History Information obtained from Patient. Social History Never smoker, Marital Status - Married, Alcohol Use -  Never, Drug Use - No History, Caffeine Use - Daily - T coffee. ea; Medical History Endocrine Patient has history of Type II Diabetes Hospitalization/Surgery History - Amuptation of 3rd,4th and 5th toes of Right foot;Oral Surgery;Anal Fissure surgery; Cholecystectomy. [name], [name] ([id number]) [id number] Page 9 of 11 Objective Constitutional Slightly hypertensive. no acute distress. Vitals Time Taken: 8:38 AM, Height: 72 in, Weight: 312 lbs, BMI: 42.3, Temperature: 99.2 F, Pulse: 92 bpm, Respiratory Rate: 18 breaths/min, Blood Pressure: 147/76 mmHg, Capillary Blood Glucose: 122 mg/dl. Respiratory Normal work of breathing on room air. General Notes: 07/21/2022: The opening of the wound is smaller but he has developed significant undermining, roughly 1.5 cm nearly circumferentially. There is no malodor or purulent drainage. There is heavy callus accumulation around the wound. Integumentary (Hair, Skin) Wound #4 status is Open. Original cause of wound was Blister.  The date acquired was: 05/06/2022. The wound has been in treatment 10 weeks. The wound is located on the Right,Distal,Lateral Foot. The wound measures 0.6cm length x 0.7cm width x 0.6cm depth; 0.33cm^2 area and 0.198cm^3 volume. There is Fat Layer (Subcutaneous Tissue) exposed. There is undermining starting at 12:00 and ending at 12:00 with a maximum distance of 1.3cm. There is a medium amount of serosanguineous drainage noted. The wound margin is distinct with the outline attached to the wound base. There is large (67-100%) red granulation within the wound bed. There is a small (1-33%) amount of necrotic tissue within the wound bed including Adherent Slough. The periwound skin appearance had no abnormalities noted for color. The periwound skin appearance exhibited: Callus. The periwound skin appearance did not exhibit: Dry/Scaly, Maceration. Periwound temperature was noted as No Abnormality. 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 There was a Selective/Open Wound Non-Viable Tissue Debridement with a total area of 0.33 sq cm performed by [name] Guess, MD. With the following instrument(s): Curette to remove Non-Viable tissue/material. Material removed includes Callus after achieving pain control using Lidocaine 4% T opical Solution. No specimens were taken. A time out was conducted at 08:58, prior to the start of the procedure. A Minimum amount of bleeding was controlled with Pressure. The procedure was tolerated well. Post Debridement Measurements: 0.6cm length x 0.7cm width x 0.6cm depth; 0.198cm^3  volume. Character of Wound/Ulcer Post Debridement is improved. Severity of Tissue Post Debridement is: Fat layer exposed. Post procedure Diagnosis Wound #4: Same as Pre-Procedure General Notes: scribed for Dr. Lady [name] by [name] Bruin, RN. Plan Follow-up Appointments: Return Appointment in 1 week. - Dr. Lady [name] - room 2 Anesthetic: (In clinic) Topical Lidocaine 4% applied to wound bed Cellular or Tissue Based Products: Cellular or Tissue Based Product Type: - Epicord #3 - ON HOLD 07/21/2022 Cellular or Tissue Based Product applied to wound bed, secured with steri-strips, cover with Adaptic or Mepitel. (DO NOT REMOVE). Bathing/ Shower/ Hygiene: May shower and wash wound with soap and water. Off-Loading: Open toe surgical shoe to: - with peg assist insert, put insert into work boot when working The following medication(s) was prescribed: lidocaine topical 4 % cream cream topical was prescribed at facility WOUND #4: - Foot Wound Laterality: Right, Lateral, Distal Cleanser: Soap and Water 1 x Per Day/30 Days  Discharge Instructions: May shower and wash wound with dial antibacterial soap and water prior to dressing change. Cleanser: Wound Cleanser (Generic) 1 x Per Day/30 Days Discharge Instructions: Cleanse the wound with wound cleanser prior to applying a clean dressing using gauze sponges, not tissue or cotton balls. Prim Dressing: Hydrofera Blue Ready Transfer Foam, 2.5x2.5 (in/in) (DME) (Generic) 1 x Per Day/30 Days ary Discharge Instructions: Apply directly to wound bed as directed [name], [name] ([id number]) [id number] Page 10 of 11 Secondary Dressing: Optifoam Non-Adhesive Dressing, 4x4 in (DME) (Generic) 1 x Per Day/30 Days Discharge Instructions: Apply over primary dressing as directed. Secondary Dressing: Woven Gauze Sponge, Non-Sterile 4x4 in 1 x Per Day/30 Days Discharge Instructions: Apply over primary dressing as directed. Secured With: Paper Tape,  2x10 (in/yd) (Generic) 1 x Per Day/30 Days Discharge Instructions: Secure dressing with tape as directed. Compression Wrap: Kerlix Roll 4.5x3.1 (in/yd) (Generic) 1 x Per Day/30 Days Discharge Instructions: Apply Kerlix and Coban compression as directed. 07/21/2022: He was out of town last week and apparently did quite a bit more walking than he had anticipated. As a result, the opening of the wound is smaller but he has developed significant undermining, roughly 1.5 cm nearly circumferentially. There is no malodor or purulent drainage. There is heavy callus accumulation around the wound. I used a curette to debride skin and callus from the wound. A elected to not place the skin substitute this week due to the wound deterioration. We will pack the wound with Hydrofera Blue. He will continue to wear the peg assist offloading insert in his shoe. Follow-up in 1 week. Electronic Signature(s) Signed: 07/21/2022 10:29:06 AM By: [name] Guess MD FACS Previous Signature: 07/21/2022 10:09:47 AM Version By: [name] Guess MD FACS Previous Signature: 07/21/2022 9:22:28 AM Version By: [name] Guess MD FACS Entered By: [name] on 07/21/2022 10:29:06 -------------------------------------------------------------------------------- HxROS Details Patient Name: Date of Service: [name], [name] 07/21/2022 8:30 [name] Medical Record Number: [id number] Patient Account Number: [id number] Date of Birth/Sex: Treating RN: [birth year] (52 y.o. [name] Primary Care Provider: Tana [name] Other Clinician: Referring Provider: Treating Provider/Extender: [name] in Treatment: 23 Information Obtained From Patient Endocrine Medical History: Positive for: Type II Diabetes Immunizations Pneumococcal Vaccine: Received Pneumococcal Vaccination: Yes Received Pneumococcal Vaccination On or After 60th Birthday: No Implantable Devices None Hospitalization / Surgery History Type of  Hospitalization/Surgery Amuptation of 3rd,4th and 5th toes of Right foot;Oral Surgery;Anal Fissure surgery; Cholecystectomy Family and Social History Never smoker; Marital Status - Married; Alcohol Use: Never; Drug Use: No History; Caffeine Use: Daily - T coffee; Financial Concerns: No; Food, ea; Clothing or Shelter Needs: No; Support System Lacking: No; Transportation Concerns: No Electronic Signature(s) Signed: 07/21/2022 10:09:47 AM By: [name] Guess MD FACS Entered By: [name] on 07/21/2022 09:20:21 [name], [name] ([id number]) [id number] Page 11 of 11 -------------------------------------------------------------------------------- SuperBill Details Patient Name: Date of Service: [name], [name] 07/21/2022 Medical Record Number: [id number] Patient Account Number: [id number] Date of Birth/Sex: Treating RN: May 22, 1970 (52 y.o. [name] Primary Care Provider: Tana [name] Other Clinician: Referring Provider: Treating Provider/Extender: [name] in Treatment: 54 Diagnosis Coding ICD-10 Codes Code Description [phone] Non-pressure chronic ulcer of other part of right foot with fat layer exposed E11.49 Type 2 diabetes mellitus with other diabetic neurological complication M86.671 Other chronic osteomyelitis, right ankle and foot Facility Procedures : CPT4 Code: [id number] Description: 97597 - DEBRIDE WOUND 1ST 20 SQ CM OR < ICD-10 Diagnosis Description L97.512 Non-pressure chronic ulcer  of other part of right foot with fat layer exposed Modifier: Quantity: 1 Physician Procedures : CPT4 Code Description Modifier [id number] - WC PHYS LEVEL 4 - EST PT 25 ICD-10 Diagnosis Description L97.512 Non-pressure chronic ulcer of other part of right foot with fat layer exposed M86.671 Other chronic osteomyelitis, right ankle and foot  E11.49 Type 2 diabetes mellitus with other diabetic neurological complication Quantity: 1 :  [id number] 97597 - WC PHYS DEBR WO ANESTH 20 SQ CM ICD-10 Diagnosis Description L97.512 Non-pressure chronic ulcer of other part of right foot with fat layer exposed Quantity: 1 Electronic Signature(s) Signed: 07/21/2022 9:22:47 AM By: [name] Guess MD FACS Entered By: [name] on 07/21/2022 09:22:46

## 2022-07-28 ENCOUNTER — Encounter (HOSPITAL_BASED_OUTPATIENT_CLINIC_OR_DEPARTMENT_OTHER): Payer: BLUE CROSS/BLUE SHIELD | Admitting: General Surgery

## 2022-07-28 DIAGNOSIS — E11621 Type 2 diabetes mellitus with foot ulcer: Secondary | ICD-10-CM | POA: Diagnosis not present

## 2022-07-28 NOTE — Progress Notes (Signed)
[name], Bird M ([id number]) [id number] Page 1 of 8 Visit Report for 07/28/2022 Arrival Information Details Patient Name: Date of Service: [place], [name] 07/28/2022 8:30 A M Medical Record Number: [id number] Patient Account Number: [id number] Date of Birth/Sex: Treating RN: 17-Jun-1970 (52 y.o. [name] Primary Care Cloud Graham: Tana Conch Other Clinician: Referring Quindell Shere: Treating Eladia Frame/Extender: Arvin Collard in Treatment: 7 Visit Information History Since Last Visit Added or deleted any medications: No Patient Arrived: Ambulatory Any new allergies or adverse reactions: No Arrival Time: 08:39 Had a fall or experienced change in No Accompanied By: self activities of daily living that may affect Transfer Assistance: None risk of falls: Patient Identification Verified: Yes Signs or symptoms of abuse/neglect since last visito No Secondary Verification Process Completed: Yes Hospitalized since last visit: No Patient Requires Transmission-Based Precautions: No Implantable device outside of the clinic excluding No Patient Has Alerts: Yes cellular tissue based products placed in the center since last visit: Has Dressing in Place as Prescribed: Yes Pain Present Now: No Electronic Signature(s) Signed: 07/28/2022 4:26:55 PM By: [name] Entered By: [name] on 07/28/2022 08:41:15 -------------------------------------------------------------------------------- Encounter Discharge Information Details Patient Name: Date of Service: [name], [name] 07/28/2022 8:30 A M Medical Record Number: [id number] Patient Account Number: [id number] Date of Birth/Sex: Treating RN: [birth year] (52 y.o. [name] Primary Care Laquasha Groome: Tana Conch Other Clinician: Referring Zai Chmiel: Treating Simran Bomkamp/Extender: Arvin Collard in Treatment: 68 Encounter Discharge Information  Items Post Procedure Vitals Discharge Condition: Stable Temperature (F): 98.5 Ambulatory Status: Ambulatory Pulse (bpm): 76 Discharge Destination: Home Respiratory Rate (breaths/min): 18 Transportation: Private Auto Blood Pressure (mmHg): 165/95 Accompanied By: self Schedule Follow-up Appointment: Yes Clinical Summary of Care: Patient Declined Electronic Signature(s) Signed: 07/28/2022 4:26:55 PM By: [name] Entered By: [name] on 07/28/2022 09:21:01 [name], Pradeep M ([id number]) [id number] Page 2 of 8 -------------------------------------------------------------------------------- Lower Extremity Assessment Details Patient Name: Date of Service: [name], [name] 07/28/2022 8:30 A M Medical Record Number: [id number] Patient Account Number: [id number] Date of Birth/Sex: Treating RN: [birth year] (52 y.o. [name] Primary Care Ajah Vanhoose: Tana Conch Other Clinician: Referring Christabell Loseke: Treating Kyra Laffey/Extender: Arvin Collard in Treatment: 55 Edema Assessment Assessed: Kyra Searles: No] Franne Forts: No] Edema: [Left: N] [Right: o] Calf Left: Right: Point of Measurement: From Medial Instep 40 cm Ankle Left: Right: Point of Measurement: From Medial Instep 24 cm Electronic Signature(s) Signed: 07/28/2022 4:26:55 PM By: [name] Entered By: [name] on 07/28/2022 08:41:49 -------------------------------------------------------------------------------- Multi Wound Chart Details Patient Name: Date of Service: [name], [name] 07/28/2022 8:30 A M Medical Record Number: [id number] Patient Account Number: [id number] Date of Birth/Sex: Treating RN: 1970-08-14 (52 y.o. M) Primary Care Tionna Gigante: Tana Conch Other Clinician: Referring Georgi Tuel: Treating Kinney Sackmann/Extender: Arvin Collard in Treatment: 55 Vital Signs Height(in): 72 Capillary Blood Glucose(mg/dl):  742 Weight(lbs): 595 Pulse(bpm): 76 Body Mass Index(BMI): 42.3 Blood Pressure(mmHg): 165/95 Temperature(F): 98.5 Respiratory Rate(breaths/min): 18 [4:Photos:] [N/A:N/A] Right, Distal, Lateral Foot N/A N/A Wound Location: Blister N/A N/A Wounding Event: Diabetic Wound/Ulcer of the Lower N/A N/A Primary Etiology: Extremity Type II Diabetes N/A N/A Comorbid History: 05/06/2022 N/A N/A Date Acquired: [name], Bertha M ([id number]) [id number] Page 3 of 8 11 N/A N/A Weeks of Treatment: Open N/A N/A Wound Status: No N/A N/A Wound Recurrence: 1x1.1x0.5 N/A N/A Measurements L x W x D (cm) 0.864 N/A N/A A (cm) : rea 0.432 N/A N/A Volume (cm) : 45.00% N/A N/A %  Reduction in A rea: -175.20% N/A N/A % Reduction in Volume: 12 Starting Position 1 (o'clock): 12 Ending Position 1 (o'clock): 1.9 Maximum Distance 1 (cm): Yes N/A N/A Undermining: Grade 2 N/A N/A Classification: Medium N/A N/A Exudate A mount: Serosanguineous N/A N/A Exudate Type: red, brown N/A N/A Exudate Color: Distinct, outline attached N/A N/A Wound Margin: Large (67-100%) N/A N/A Granulation A mount: Red N/A N/A Granulation Quality: Small (1-33%) N/A N/A Necrotic A mount: Fat Layer (Subcutaneous Tissue): Yes N/A N/A Exposed Structures: Muscle: Yes Fascia: No Tendon: No Joint: No Bone: No Small (1-33%) N/A N/A Epithelialization: Debridement - Selective/Open Wound N/A N/A Debridement: Pre-procedure Verification/Time Out 09:00 N/A N/A Taken: Lidocaine 4% T opical Solution N/A N/A Pain Control: Callus, Slough N/A N/A Tissue Debrided: Non-Viable Tissue N/A N/A Level: 0.86 N/A N/A Debridement A (sq cm): rea Curette N/A N/A Instrument: Minimum N/A N/A Bleeding: Pressure N/A N/A Hemostasis A chieved: Procedure was tolerated well N/A N/A Debridement Treatment Response: 1x1.1x0.5 N/A N/A Post Debridement Measurements L x W x D (cm) 0.432 N/A N/A Post  Debridement Volume: (cm) Callus: Yes N/A N/A Periwound Skin Texture: Maceration: No N/A N/A Periwound Skin Moisture: Dry/Scaly: No No Abnormalities Noted N/A N/A Periwound Skin Color: No Abnormality N/A N/A Temperature: Debridement N/A N/A Procedures Performed: Treatment Notes Wound #4 (Foot) Wound Laterality: Right, Lateral, Distal Cleanser Soap and Water Discharge Instruction: May shower and wash wound with dial antibacterial soap and water prior to dressing change. Wound Cleanser Discharge Instruction: Cleanse the wound with wound cleanser prior to applying a clean dressing using gauze sponges, not tissue or cotton balls. Peri-Wound Care Topical Gentamicin Discharge Instruction: As directed by physician Mupirocin Ointment Discharge Instruction: Apply Mupirocin (Bactroban) as instructed Primary Dressing Hydrofera Blue Ready Transfer Foam, 2.5x2.5 (in/in) Discharge Instruction: Apply directly to wound bed as directed Secondary Dressing Optifoam Non-Adhesive Dressing, 4x4 in Discharge Instruction: Apply over primary dressing as directed. Woven Gauze Sponge, Non-Sterile 4x4 in Discharge Instruction: Apply over primary dressing as directed. Secured With Paper Tape, 2x10 (in/yd) Discharge Instruction: Secure dressing with tape as directed. [name], Kennth M ([id number]) [id number] Page 4 of 8 Compression Wrap Kerlix Roll 4.5x3.1 (in/yd) Discharge Instruction: Apply Kerlix and Coban compression as directed. Compression Stockings Add-Ons Electronic Signature(s) Signed: 07/28/2022 9:45:39 AM By: Duanne Guess MD FACS Entered By: Duanne Guess on 07/28/2022 09:45:39 -------------------------------------------------------------------------------- Multi-Disciplinary Care Plan Details Patient Name: Date of Service: [name], [name] 07/28/2022 8:30 A M Medical Record Number: [id number] Patient Account Number: [id number] Date of Birth/Sex: Treating  RN: 10-08-1970 (52 y.o. [name] Primary Care Bud Kaeser: Tana Conch Other Clinician: Referring Decoda Van: Treating Cash Meadow/Extender: Arvin Collard in Treatment: 46 Multidisciplinary Care Plan reviewed with physician Active Inactive Nutrition Nursing Diagnoses: Impaired glucose control: actual or potential Potential for alteratiion in Nutrition/Potential for imbalanced nutrition Goals: Patient/caregiver will maintain therapeutic glucose control Date Initiated: 07/29/2021 Target Resolution Date: 09/19/2022 Goal Status: Active Interventions: Assess patient nutrition upon admission and as needed per policy Provide education on elevated blood sugars and impact on wound healing Treatment Activities: Patient referred to Primary Care Physician for further nutritional evaluation : 07/29/2021 Notes: Wound/Skin Impairment Nursing Diagnoses: Impaired tissue integrity Goals: Patient/caregiver will verbalize understanding of skin care regimen Date Initiated: 07/04/2021 Target Resolution Date: 09/19/2022 Goal Status: Active Interventions: Assess ulceration(s) every visit Treatment Activities: Skin care regimen initiated : 07/04/2021 Notes: Electronic Signature(s) Signed: 07/28/2022 4:26:55 PM By: Herrington, Taylor [name], Ladarrell M ([id number]) [id number] Page 5 of 8 Entered By: [name]  on 07/28/2022 08:56:35 -------------------------------------------------------------------------------- Pain Assessment Details Patient Name: Date of Service: [name], [name] 07/28/2022 8:30 A M Medical Record Number: [id number] Patient Account Number: [id number] Date of Birth/Sex: Treating RN: [birth year] (52 y.o. [name] Primary Care Matan Steen: Tana Conch Other Clinician: Referring Candie Gintz: Treating Lorry Anastasi/Extender: Arvin Collard in Treatment: 64 Active Problems Location of Pain  Severity and Description of Pain Patient Has Paino No Site Locations Rate the pain. Current Pain Level: 0 Pain Management and Medication Current Pain Management: Electronic Signature(s) Signed: 07/28/2022 4:26:55 PM By: [name] Entered By: [name] on 07/28/2022 08:41:47 -------------------------------------------------------------------------------- Patient/Caregiver Education Details Patient Name: Date of Service: Oletta Darter, [name] 7/8/2024andnbsp8:30 A M Medical Record Number: [id number] Patient Account Number: [id number] Date of Birth/Gender: Treating RN: 1971-01-19 (52 y.o. [name] Primary Care Physician: Tana Conch Other Clinician: Referring Physician: Treating Physician/Extender: Arvin Collard in Treatment: 82 Education Assessment Education Provided To: Patient Education Topics Provided Wound/Skin Impairment: [name], Matson M ([id number]) [id number] Page 6 of 8 Methods: Explain/Verbal Responses: Reinforcements needed, State content correctly Electronic Signature(s) Signed: 07/28/2022 4:26:55 PM By: [name] Entered By: [name] on 07/28/2022 08:56:48 -------------------------------------------------------------------------------- Wound Assessment Details Patient Name: Date of Service: [name], [name] 07/28/2022 8:30 A M Medical Record Number: [id number] Patient Account Number: [id number] Date of Birth/Sex: Treating RN: 08-08-1970 (52 y.o. [name] Primary Care Loletta Harper: Tana Conch Other Clinician: Referring Mussa Groesbeck: Treating Jaleeya Mcnelly/Extender: Arvin Collard in Treatment: 55 Wound Status Wound Number: 4 Primary Etiology: Diabetic Wound/Ulcer of the Lower Extremity Wound Location: Right, Distal, Lateral Foot Wound Status: Open Wounding Event: Blister Comorbid History: Type II Diabetes Date Acquired:  05/06/2022 Weeks Of Treatment: 11 Clustered Wound: No Photos Wound Measurements Length: (cm) 1 Width: (cm) 1.1 Depth: (cm) 0.5 Area: (cm) 0.864 Volume: (cm) 0.432 % Reduction in Area: 45% % Reduction in Volume: -175.2% Epithelialization: Small (1-33%) Undermining: Yes Starting Position (o'clock): 12 Ending Position (o'clock): 12 Maximum Distance: (cm) 1.9 Wound Description Classification: Grade 2 Wound Margin: Distinct, outline attached Exudate Amount: Medium Exudate Type: Serosanguineous Exudate Color: red, brown Foul Odor After Cleansing: No Slough/Fibrino Yes Wound Bed Granulation Amount: Large (67-100%) Exposed Structure Granulation Quality: Red Fascia Exposed: No Necrotic Amount: Small (1-33%) Fat Layer (Subcutaneous Tissue) Exposed: Yes Necrotic Quality: Adherent Slough Tendon Exposed: No Muscle Exposed: Yes Necrosis of Muscle: No Joint Exposed: No Bone Exposed: No [name], Larone M ([id number]) [id number] Page 7 of 8 Periwound Skin Texture Texture Color No Abnormalities Noted: No No Abnormalities Noted: Yes Callus: Yes Temperature / Pain Temperature: No Abnormality Moisture No Abnormalities Noted: No Dry / Scaly: No Maceration: No Treatment Notes Wound #4 (Foot) Wound Laterality: Right, Lateral, Distal Cleanser Soap and Water Discharge Instruction: May shower and wash wound with dial antibacterial soap and water prior to dressing change. Wound Cleanser Discharge Instruction: Cleanse the wound with wound cleanser prior to applying a clean dressing using gauze sponges, not tissue or cotton balls. Peri-Wound Care Topical Gentamicin Discharge Instruction: As directed by physician Mupirocin Ointment Discharge Instruction: Apply Mupirocin (Bactroban) as instructed Primary Dressing Hydrofera Blue Ready Transfer Foam, 2.5x2.5 (in/in) Discharge Instruction: Apply directly to wound bed as directed Secondary Dressing Optifoam  Non-Adhesive Dressing, 4x4 in Discharge Instruction: Apply over primary dressing as directed. Woven Gauze Sponge, Non-Sterile 4x4 in Discharge Instruction: Apply over primary dressing as directed. Secured With Paper Tape, 2x10 (in/yd) Discharge Instruction: Secure dressing with tape as directed. Compression Wrap Kerlix Roll 4.5x3.1 (  in/yd) Discharge Instruction: Apply Kerlix and Coban compression as directed. Compression Stockings Add-Ons Electronic Signature(s) Signed: 07/28/2022 4:26:55 PM By: [name] Entered By: [name] on 07/28/2022 09:03:24 -------------------------------------------------------------------------------- Vitals Details Patient Name: Date of Service: [name], [name] 07/28/2022 8:30 A M Medical Record Number: [id number] Patient Account Number: [id number] Date of Birth/Sex: Treating RN: 20-Oct-1970 (52 y.o. [name] Primary Care Buford Bremer: Tana Conch Other Clinician: Referring Jonluke Cobbins: Treating Latroya Ng/Extender: Arvin Collard in Treatment: 55 Vital Signs Time Taken: 08:41 Temperature (F): 98.5 [name], Laddie Prospect ([id number]) [id number] Page 8 of 8 Height (in): 72 Pulse (bpm): 76 Weight (lbs): 312 Respiratory Rate (breaths/min): 18 Body Mass Index (BMI): 42.3 Blood Pressure (mmHg): 165/95 Capillary Blood Glucose (mg/dl): 782 Reference Range: 80 - 120 mg / dl Electronic Signature(s) Signed: 07/28/2022 4:26:55 PM By: [name] Entered By: [name] on 07/28/2022 08:42:20

## 2022-07-28 NOTE — Progress Notes (Signed)
[name], [name] ([id number]) [id number] Page 1 of 12 Visit Report for 07/28/2022 Chief Complaint Document Details Patient Name: Date of Service: [name], [name] 07/28/2022 8:30 [name] Medical Record Number: [id number] Patient Account Number: [id number] Date of Birth/Sex: Treating RN: [birth year] (52 y.o. [name] Primary Care Provider: Tana [name] Other Clinician: Referring Provider: Treating Provider/Extender: Arvin Collard in Treatment: 55 Information Obtained from: Patient Chief Complaint Patients presents for treatment of an open diabetic ulcer Electronic Signature(s) Signed: 07/28/2022 9:45:47 AM By: [name] Guess MD FACS Entered By: [name] on 07/28/2022 09:45:47 -------------------------------------------------------------------------------- Debridement Details Patient Name: Date of Service: [name], [name] 07/28/2022 8:30 [name] Medical Record Number: [id number] Patient Account Number: [id number] Date of Birth/Sex: Treating RN: [birth year] (52 y.o. [name] Primary Care Provider: Tana [name] Other Clinician: Referring Provider: Treating Provider/Extender: Arvin Collard in Treatment: 55 Debridement Performed for Assessment: Wound #4 Right,Distal,Lateral Foot Performed By: Physician [name] Guess, MD Debridement Type: Debridement Severity of Tissue Pre Debridement: Muscle involvement without necrosis Level of Consciousness (Pre-procedure): Awake and Alert Pre-procedure Verification/Time Out Yes - 09:00 Taken: Start Time: 09:00 Pain Control: Lidocaine 4% T opical Solution Percent of Wound Bed Debrided: 100% T Area Debrided (cm): otal 0.86 Tissue and other material debrided: Callus, Slough, Slough Level: Non-Viable Tissue Debridement Description: Selective/Open Wound Instrument: Curette Specimen: Tissue Culture Number of Specimens T aken: 1 Bleeding: Minimum Hemostasis  Achieved: Pressure Response to Treatment: Procedure was tolerated well Level of Consciousness (Post- Awake and Alert procedure): Post Debridement Measurements of Total Wound Length: (cm) 1 Width: (cm) 1.1 Depth: (cm) 0.5 Volume: (cm) 0.432 Character of Wound/Ulcer Post Debridement: Improved Severity of Tissue Post Debridement: Muscle involvement without necrosis [name], [name] ([id number]) [id number] Page 2 of 12 Post Procedure Diagnosis Same as Pre-procedure Notes scribed for Dr. Lady Gary by [name], RN Electronic Signature(s) Signed: 07/28/2022 10:20:35 AM By: [name] Guess MD FACS Signed: 07/28/2022 4:26:55 PM By: Samuella [name] Entered By: Samuella [name] on 07/28/2022 09:04:10 -------------------------------------------------------------------------------- HPI Details Patient Name: Date of Service: [name], [name] 07/28/2022 8:30 [name] Medical Record Number: [id number] Patient Account Number: [id number] Date of Birth/Sex: Treating RN: 08/08/1970 (52 y.o. 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 The most recent amputation was in August 2022. At that operation, antibiotic beads were placed in the wound. He has been managed by podiatry for his procedures and management of his wounds. He has been in a Scientist, water quality. He is on oral doxycycline. They have been using Betadine wet to dry dressings along with Iodosorb. The patient states that when he thinks the wound is getting too dry, he applies topical Neosporin. At his last visit, on June 7 of this year, the podiatrist  determined that he felt the wound was stalled and referred him to wound care for additional evaluation and management. An MRI has been ordered, but not yet scheduled or performed. Pathology from his operation in August 2022 demonstrated findings consistent with chronic osteomyelitis. Today, there is a large irregular wound on the plantar surface of his right foot, at about the level of the fifth metatarsal head. This tracks through to a pinpoint opening on  the dorsal lateral portion of his foot. The intake nurse reported purulent drainage. There is some malodor from the wound. No frank necrosis identified. 07/11/2021: Today, the wounds do not connect. I attempted multiple times from various directions and the shared tunnel is no longer open. He has some slough accumulation on the dorsal part of his foot as well as slough and callus buildup on the plantar surface. His MRI is scheduled for June 29. No purulent drainage or malodor appreciated today. 07/19/2021: The lateral foot wound has closed and there is no tunnel connecting the plantar foot wound to that site. The plantar foot wound still probes quite deeply, however. There is some slough, eschar, and nonviable tissue accumulated in the wound bed. No malodorous drainage present. His MRI was performed yesterday and is consistent with fairly extensive osteomyelitis. 07/29/2021: The patient has an appointment with infectious disease on July 18 to treat his osteomyelitis. The plantar wound still probes quite deeply, approaching bone. The orifice has narrowed quite substantially, however, making it more difficult for him to pack. 08/05/2021: The tunnel connecting the lateral foot wound to the plantar foot wound has reopened. He sees infectious disease tomorrow to discuss long-term antibiotic treatment for osteomyelitis. There was a bit of murky drainage in the wound, but this was noted after he had had topical lidocaine applied so may have just been a blob  of the anesthetic. No odor or frank purulent drainage. The wound probes deeply at the midfoot approaching bone. He does have MRI results confirming his diagnosis of osteomyelitis. He has failed to progress with conventional treatment and I think his best chance for preservation of the foot is to initiate hyperbaric oxygen therapy. 08/13/2021: The tunnel connecting the 2 wounds has closed again. He has a PICC line and is getting IV daptomycin and cefepime. EKG and chest x-ray are within normal limits. He is scheduled to start hyperbaric oxygen therapy tomorrow. The wound in his midfoot probes deeply, approaching bone. The skin at the orifice continues to heaped up and threatens to close over despite the large cavity within. No erythema, induration, or purulent drainage. The wound on his lateral foot is fairly small and quite clean. 08/19/2021: The lateral foot wound has nearly closed. The wound in his midfoot does not probe quite as deeply today. The skin at the orifice continues to try to roll in and obscure the opening. No frankly necrotic tissue appreciated. He is tolerating hyperbaric oxygen therapy. 08/26/2021: The lateral foot wound has closed completely. The wound in his midfoot is shallower again today. The wound orifice is contracting. We are using gentamicin and silver alginate. He continues on IV daptomycin and cefepime and is tolerating hyperbaric oxygen therapy without difficulty. 09/02/2021: His foot is a little bit sore today but he was up walking on it all weekend doing back to school shopping with his daughter. The tunneling is down to about 3 cm and I do not appreciate bone at the tip of the probe. The wound is clean but has some callus creating an overhanging lip at the distal aspect. He continues to receive hyperbaric oxygen therapy as well as IV daptomycin and cefepime. 09/09/2021: His wound continues to improve. The tunnel has come in by 0.9 cm. He continues to form callus around the  orifice of the wound. He is tolerating hyperbaric oxygen therapy along with his IV antibiotics. 09/16/2021: The tunneling is down to just half a centimeter. He continues to build up callus around the orifice of the wound.  He completed his course of IV antibiotics and his PICC line is scheduled to be removed this Friday. He is tolerating hyperbaric oxygen without difficulty. We have been applying topical gentamicin and silver alginate to his wound. [name], [name] ([id number]) [id number] Page 3 of 12 09/24/2021: The wound depth has come in even further. There is a little bit of callus buildup around the orifice. The opening is quite narrow, at this point. 09/30/2021: The wound depth is about the same this week. The opening continues to contract with callus accumulation. There is some discoloration of the skin on the lateral part of his foot and he admitted to walking more than usual this weekend and also wearing a different pair shoes. He continues to tolerate hyperbaric oxygen therapy without difficulty. 10/07/2021: The wound depth has contracted a bit and is now approximately 1 to 1.5 cm. The orifice of the wound continues to try and close in over the space. There is just some callus and skin around the margins obscuring the orifice. 10/14/2021: The wound depth has come in a little bit more. The orifice of the wound is rolling inwards with epithelium and callus, making it difficult to pack the wound. 10/21/2021: There is still 1 portion of the wound, at the lateral aspect, that is still a couple of centimeters deep. The architecture of the wound makes this somewhat challenging to access and pack. Everything else looks clean. He has his usual accumulation of callus and skin, narrowing the orifice. 10/29/2021: No significant change to the depth of the wound. The orifice continues to contract with callus and skin narrowing the opening. His wife has been packing the wound and doing  an excellent job. 11/05/2021: The depth of the wound has come in by about a centimeter. There is less callus accumulation around the orificedespite this, the wound is narrower today. We are still awaiting insurance approval for Kerecis powder. 11/12/2021: The depth of the wound continues to contract. He has accumulated some callus around the orifice, as usual. We have received a verbal confirmation that he is approved for Kerecis, but no formal paperwork has yet been received. 11/19/2021: The depth continues to come in. There is callus accumulation around the orifice, as usual. He has been formally approved for Kerecis, but unfortunately we do not have a billing mechanism for the powder in [employer] yet. We are working to address this so we can begin treatment. 11/25/2021: Continued filling in of the depth of the wound. Continued buildup of callus around the orifice. No concern for infection. As we do not have a way to bill for Kerecis powder, but are capable of billing for the [facility] sheet, we are going to use that on him instead. 12/03/2021: The depth of the wound continues to fill in. As usual, he has accumulated callus and skin around the orifice of the wound. No concern for infection. He will complete his hyperbaric oxygen therapy this week. 12/17/2021: The wound depth continues to contract. There is callus and skin accumulation around the orifice of the wound, as per usual. There is more epithelium on the actual wound surface. Unfortunately, his Darlen Round was not ordered. 12/24/2021: The wound depth has come in a little bit more this week. There is less skin accumulation around the orifice, but still some callus. On questioning, he admits to spending a fair amount of time on his feet. 12/12; this is a very difficult wound to assess size slitlike wound with some depth but minimal opening. I applied Kerecis No.  4 12/18; Kerecis No. 5 01/15/2022: His wound measured deeper today. There is also some  evidence of pressure induced tissue injury around the lateral aspect of his foot. The patient reports that he was wearing different shoes than usual and felt like his foot was rolling a lot over the weekend. There is heavy callus around the orifice of the wound. No concern for infection. 01/21/2022: The depth decreased today from 1.7 to 1.0 cm. Significantly less tissue damage as compared to last week. Still with callus and skin accumulation around the orifice. 01/29/2022: The wound continues to contract and the depth is coming in further. We are still working on getting [employer] for him. 02/04/2022: No real change this week. Still working on the [employer]. 02/11/2022: No significant changes again. We are still working on the [employer]. 02/18/2022: No change to the wound. We have submitted in IVR for Epicord. 02/25/2022: Unfortunately, his wound has deteriorated over the past week. It is deeper and is approaching bone once again. There is more slough accumulation and the tissue shows signs of pressure injury. He says that he has been trying to wear his new work boots, which are waterproof. He says they make his feet sweat excessively. I think our main challenge with him is that we just are not able to adequately offload him while he is still working and on his feet through much of the day. He is willing to consider total contact casting, but will need to make some arrangements with work. On a more positive note, however, he has been approved for Epicord. 03/03/2022: The wound probed a little bit deeper again today. We are planning to put him in a total contact cast as soon as he has cleared it with work to take some time off. 03/10/2022: The wound was a little bit shallower today and the overall appearance is significantly improved. We are planning to put him in a cast today. 03/12/2022: He is here for his first total contact cast change. He denies any issues with pressure, rubbing, or  pain. 03/17/2022: For some reason, the wound probes deeper today. There is some slough and nonviable subcutaneous tissue around the opening. He denies any difficulty with the total contact cast. 03/24/2022: The wound is shallower today. There is better granulation tissue visible on the wound surface. 03/31/2022: His cast protector got a hole in it and his cast got wet. His foot is macerated. The wound measured a little deeper today. There is some hypertrophic granulation tissue at the orifice of his wound. 3/18; patient presents for follow-up. He had no issues with the total contact cast. We have been using epi cord to the wound bed. Overall wound is stable. Patient denies signs of infection. 04/14/2022: The depth of the wound has rather remarkably decreased to 1.3 cm today. The orifice of the wound has also contracted quite a bit. 04/21/2022: The depth of the wound has decreased even further. The orifice is also smaller. The wound is very clean. 04/28/2022: The depth of the wound is down to 0.5 cm. The orifice continues to contract. 05/06/2022: The primary wound is substantially smaller and shallower today. Unfortunately, he developed a blister distal to this on the plantar surface of his foot. The tissue breakdown underneath the blister does extend into the fat layer in 1 area over a bony prominence from his collapsed arch. The remainder of the tissue underneath the blister is intact skin. [name], [name] ([id number]) [id number] Page 4 of 12 05/12/2022: The  primary wound is down to just a couple of millimeters and is flush with the surrounding skin. The area that had blistered last week had some purulent drainage coming follow-up a corner that was underneath some dry skin. No malodor associated with this. There is some additional slough that has accumulated. 05/19/2022: His primary wound has actually healed. The area that opened after a blister formed is smaller. There is a little  bit of thickened skin around the edges, no concern for infection. 05/26/2022: The wound is stable. Unfortunately, for some reason the primary dressing was left off and there was no silver alginate applied. There was also no zinc oxide applied to the periwound so there is some tissue maceration present due to the fact that the only thing used on the wound was a foam donut, which retains the moisture from his foot. 06/02/2022: The wound measured 2 mm wider today. There is still some blue-green staining on the surface. 06/09/2022: No change to the wound dimensions. The culture that I took last week grew out Pseudomonas aeruginosa and Enterococcus faecalis. Levofloxacin was prescribed and he began taking this last Wednesday. 06/18/2022: He completed his levofloxacin yesterday. Today, the wound has not changed in its dimensions. There is callus accumulation around the wound and some slough on the wound surface. For what ever reason, we do not seem to be getting the offloading affect from the total contact cast that one might expect. 06/23/2022: The wound is a little bit smaller today. There is some undermining created by accumulated callus, but underneath the callus, there is epithelialization. Minimal slough on the surface. He has been approved for Epicord, so we will apply that today. He seems to be getting adequate offloading using the peg assist insert in his shoe. 06/30/2022: The wound is smaller today. There is no undermining. The wound surface looks healthier. Still with some periwound callus accumulation. 07/07/2022: The wound is about the same size. The center of the wound is fairly soft and mushy, however. 07/21/2022: He was out of town last week and apparently did quite a bit more walking than he had anticipated. As a result, opening the wound is smaller but he has developed significant undermining, roughly 1.5 cm nearly circumferentially. There is no malodor or purulent drainage. There is heavy callus  accumulation around the wound. 07/28/2022: There has been further breakdown and undermining that has occurred. The muscle is now exposed but there does not appear to be any evidence of necrosis. No malodor or purulent drainage. Electronic Signature(s) Signed: 07/28/2022 9:47:25 AM By: [name] Guess MD FACS Entered By: [name] on 07/28/2022 09:47:25 -------------------------------------------------------------------------------- Physical Exam Details Patient Name: Date of Service: [name], [name] 07/28/2022 8:30 [name] Medical Record Number: [id number] Patient Account Number: [id number] Date of Birth/Sex: Treating RN: 01/13/1971 (52 y.o. [name] Primary Care Provider: Tana [name] Other Clinician: Referring Provider: Treating Provider/Extender: Arvin Collard in Treatment: 54 Constitutional Hypertensive, asymptomatic. . . . no acute distress. Respiratory Normal work of breathing on room air. Notes 07/28/2022: There has been further breakdown and undermining that has occurred. The muscle is now exposed but there does not appear to be any evidence of necrosis. No malodor or purulent drainage. Electronic Signature(s) Signed: 07/28/2022 9:47:54 AM By: [name] Guess MD FACS Entered By: [name] on 07/28/2022 09:47:53 [name], [name] ([id number]) [id number] Page 5 of 12 -------------------------------------------------------------------------------- Physician Orders Details Patient Name: Date of Service: [name], [name] 07/28/2022 8:30 [name] Medical Record Number: [id number]  Patient Account Number: [id number] Date of Birth/Sex: Treating RN: 02/08/1970 (52 y.o. 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 - Dr. Lady Gary - room 2 Return A Anesthetic (In clinic) Topical Lidocaine 4% applied to wound bed Cellular or Tissue Based Products Cellular or Tissue Based Product Type: - Epicord #3 - ON HOLD 07/21/2022 Cellular or Tissue Based Product applied to wound bed, secured with steri-strips, cover with Adaptic or Mepitel. (DO NOT REMOVE). Bathing/ Shower/ Hygiene May shower and wash wound with soap and water. Off-Loading Open toe surgical shoe to: - with peg assist insert, put insert into work boot when working Additional Orders / Instructions Follow Nutritious Diet - incorporate protein shakes Other: - vitamin C 500 mg 3 times a day and zinc 30-50 mg a day Juven Shake 1-2 times daily. Wound Treatment Wound #4 - Foot Wound Laterality: Right, Lateral, Distal Cleanser: Soap and Water 1 x Per Day/30 Days Discharge Instructions: May shower and wash wound with dial antibacterial soap and water prior to dressing change. Cleanser: Wound Cleanser (Generic) 1 x Per Day/30 Days Discharge Instructions: Cleanse the wound with wound cleanser prior to applying a clean dressing using gauze sponges, not tissue or cotton balls. Topical: Gentamicin 1 x Per Day/30 Days Discharge Instructions: As directed by physician Topical: Mupirocin Ointment 1 x Per Day/30 Days Discharge Instructions: Apply Mupirocin (Bactroban) as instructed Prim Dressing: Hydrofera Blue Ready Transfer Foam, 2.5x2.5 (in/in) (Generic) 1 x Per Day/30 Days ary Discharge Instructions: Apply directly to wound bed as directed Secondary Dressing: Optifoam Non-Adhesive Dressing, 4x4 in (Generic) 1 x Per Day/30  Days Discharge Instructions: Apply over primary dressing as directed. Secondary Dressing: Woven Gauze Sponge, Non-Sterile 4x4 in 1 x Per Day/30 Days Discharge Instructions: Apply over primary dressing as directed. Secured With: Paper Tape, 2x10 (in/yd) (Generic) 1 x Per Day/30 Days Discharge Instructions: Secure dressing with tape as directed. Compression Wrap: Kerlix Roll 4.5x3.1 (in/yd) (Generic) 1 x Per Day/30 Days Discharge Instructions: Apply Kerlix and Coban compression as directed. Consults Vascular - vascular studies including formal ABIs and doppler arterial studies due to nonhealing wound to right foot Laboratory [name], [name] ([id number]) [id number] Page 6 of 12 naerobe culture (MICRO) - PCR of nonhealing wound to right foot Bacteria identified in Unspecified specimen by A LOINC Code: 635-3 Convenience Name: Anaerobic culture Patient Medications llergies: No Known Allergies A Notifications Medication Indication Start End 07/28/2022 lidocaine DOSE topical 4 % cream - cream topical 07/28/2022 mupirocin DOSE topical 2 % ointment - Use as directed for dressing changes Electronic Signature(s) Signed: 07/28/2022 10:20:35 AM By: [name] Guess MD FACS Previous Signature: 07/28/2022 9:49:52 AM Version By: [name] Guess MD FACS Entered By: [name] on 07/28/2022 09:50:03 -------------------------------------------------------------------------------- Problem List Details Patient Name: Date of Service: [name], [name] 07/28/2022 8:30 [name] Medical Record Number: [id number] Patient Account Number: [id number] Date of Birth/Sex: Treating RN: Mar 10, 1970 (52 y.o. 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 07/04/2021 No Yes M86.671 Other chronic osteomyelitis, right ankle and foot 07/04/2021 No Yes E11.621 Type 2 diabetes mellitus with foot ulcer 07/04/2021 No Yes Inactive Problems Resolved Problems ICD-10 Code Description Active Date Resolved Date L97.514 Non-pressure chronic ulcer of other part of right foot with necrosis of bone 07/04/2021 07/04/2021 Electronic Signature(s) Signed: 07/28/2022 9:46:24 AM By: [name] Guess MD FACS [name], [name] 07/28/2022 9:46:24 AM By: [name] Guess MD FACS Signed: Judie Petit ([id number]) [id number] Page 7 of 12 Previous Signature: 07/28/2022 9:45:32 AM Version By: [name] Guess MD FACS Entered By: [name] on 07/28/2022 09:46:24 -------------------------------------------------------------------------------- Progress Note Details Patient Name: Date of Service: [name], [name] 07/28/2022 8:30 [name] Medical Record Number: [id number] Patient Account Number: [id number] Date of Birth/Sex: Treating RN: 15-Mar-1970 (52 y.o. 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 The most recent amputation was in August 2022. At that operation, antibiotic beads were placed in the wound. He has been managed by podiatry for his procedures and management of his wounds. He has been in a Therapist, nutritional. He is on oral doxycycline. They have been using Betadine wet to dry dressings along with Iodosorb. The patient states that when he thinks the wound is getting too dry, he applies topical Neosporin. At his last visit, on June 7 of this year, the podiatrist determined that he felt the wound was stalled and referred him to wound care for additional evaluation and management. An MRI has been ordered, but not yet scheduled or performed. Pathology from his operation in August 2022 demonstrated findings consistent with chronic osteomyelitis. Today, there is a large irregular wound on the plantar surface of his right foot, at about the level of the fifth metatarsal head. This tracks through to a pinpoint opening on the dorsal lateral portion of his foot. The intake nurse reported purulent drainage. There is some malodor from the wound. No frank necrosis identified. 07/11/2021: Today, the wounds do not connect. I attempted multiple times from various directions and the shared tunnel is no longer open. He has some slough accumulation on the dorsal part of his foot as well as slough and callus buildup on the plantar surface. His MRI is scheduled for June 29. No purulent drainage or malodor appreciated today. 07/19/2021: The lateral foot wound has closed and there is no tunnel connecting the plantar foot wound to that site. The plantar foot wound still probes quite deeply, however. There is some slough, eschar, and nonviable tissue accumulated in the wound bed. No malodorous drainage present. His MRI was performed yesterday and is consistent with fairly extensive osteomyelitis. 07/29/2021: The patient has an appointment with infectious disease on July 18 to treat his osteomyelitis. The plantar wound still probes quite deeply, approaching bone. The orifice has narrowed quite substantially, however, making it more difficult for him to pack. 08/05/2021: The tunnel connecting the lateral foot wound to the  plantar foot wound has reopened.  He sees infectious disease tomorrow to discuss long-term antibiotic treatment for osteomyelitis. There was a bit of murky drainage in the wound, but this was noted after he had had topical lidocaine applied so may have just been a blob of the anesthetic. No odor or frank purulent drainage. The wound probes deeply at the midfoot approaching bone. He does have MRI results confirming his diagnosis of osteomyelitis. He has failed to progress with conventional treatment and I think his best chance for preservation of the foot is to initiate hyperbaric oxygen therapy. 08/13/2021: The tunnel connecting the 2 wounds has closed again. He has a PICC line and is getting IV daptomycin and cefepime. EKG and chest x-ray are within normal limits. He is scheduled to start hyperbaric oxygen therapy tomorrow. The wound in his midfoot probes deeply, approaching bone. The skin at the orifice continues to heaped up and threatens to close over despite the large cavity within. No erythema, induration, or purulent drainage. The wound on his lateral foot is fairly small and quite clean. 08/19/2021: The lateral foot wound has nearly closed. The wound in his midfoot does not probe quite as deeply today. The skin at the orifice continues to try to roll in and obscure the opening. No frankly necrotic tissue appreciated. He is tolerating hyperbaric oxygen therapy. 08/26/2021: The lateral foot wound has closed completely. The wound in his midfoot is shallower again today. The wound orifice is contracting. We are using gentamicin and silver alginate. He continues on IV daptomycin and cefepime and is tolerating hyperbaric oxygen therapy without difficulty. 09/02/2021: His foot is a little bit sore today but he was up walking on it all weekend doing back to school shopping with his daughter. The tunneling is down to about 3 cm and I do not appreciate bone at the tip of the probe. The wound is clean but has  some callus creating an overhanging lip at the distal aspect. He continues to receive hyperbaric oxygen therapy as well as IV daptomycin and cefepime. 09/09/2021: His wound continues to improve. The tunnel has come in by 0.9 cm. He continues to form callus around the orifice of the wound. He is tolerating hyperbaric oxygen therapy along with his IV antibiotics. 09/16/2021: The tunneling is down to just half a centimeter. He continues to build up callus around the orifice of the wound. He completed his course of IV antibiotics and his PICC line is scheduled to be removed this Friday. He is tolerating hyperbaric oxygen without difficulty. We have been applying topical gentamicin and silver alginate to his wound. 09/24/2021: The wound depth has come in even further. There is a little bit of callus buildup around the orifice. The opening is quite narrow, at this point. 09/30/2021: The wound depth is about the same this week. The opening continues to contract with callus accumulation. There is some discoloration of the skin on the lateral part of his foot and he admitted to walking more than usual this weekend and also wearing a different pair shoes. He continues to tolerate hyperbaric oxygen therapy without difficulty. [name], [name] ([id number]) [id number] Page 8 of 12 10/07/2021: The wound depth has contracted a bit and is now approximately 1 to 1.5 cm. The orifice of the wound continues to try and close in over the space. There is just some callus and skin around the margins obscuring the orifice. 10/14/2021: The wound depth has come in a little bit more. The orifice of the wound is rolling inwards with epithelium and  callus, making it difficult to pack the wound. 10/21/2021: There is still 1 portion of the wound, at the lateral aspect, that is still a couple of centimeters deep. The architecture of the wound makes this somewhat challenging to access and pack. Everything else  looks clean. He has his usual accumulation of callus and skin, narrowing the orifice. 10/29/2021: No significant change to the depth of the wound. The orifice continues to contract with callus and skin narrowing the opening. His wife has been packing the wound and doing an excellent job. 11/05/2021: The depth of the wound has come in by about a centimeter. There is less callus accumulation around the orificedespite this, the wound is narrower today. We are still awaiting insurance approval for Kerecis powder. 11/12/2021: The depth of the wound continues to contract. He has accumulated some callus around the orifice, as usual. We have received a verbal confirmation that he is approved for Kerecis, but no formal paperwork has yet been received. 11/19/2021: The depth continues to come in. There is callus accumulation around the orifice, as usual. He has been formally approved for Kerecis, but unfortunately we do not have a billing mechanism for the powder in [employer] yet. We are working to address this so we can begin treatment. 11/25/2021: Continued filling in of the depth of the wound. Continued buildup of callus around the orifice. No concern for infection. As we do not have a way to bill for Kerecis powder, but are capable of billing for the [facility] sheet, we are going to use that on him instead. 12/03/2021: The depth of the wound continues to fill in. As usual, he has accumulated callus and skin around the orifice of the wound. No concern for infection. He will complete his hyperbaric oxygen therapy this week. 12/17/2021: The wound depth continues to contract. There is callus and skin accumulation around the orifice of the wound, as per usual. There is more epithelium on the actual wound surface. Unfortunately, his Darlen Round was not ordered. 12/24/2021: The wound depth has come in a little bit more this week. There is less skin accumulation around the orifice, but still some callus. On questioning,  he admits to spending a fair amount of time on his feet. 12/12; this is a very difficult wound to assess size slitlike wound with some depth but minimal opening. I applied Kerecis No. 4 12/18; Kerecis No. 5 01/15/2022: His wound measured deeper today. There is also some evidence of pressure induced tissue injury around the lateral aspect of his foot. The patient reports that he was wearing different shoes than usual and felt like his foot was rolling a lot over the weekend. There is heavy callus around the orifice of the wound. No concern for infection. 01/21/2022: The depth decreased today from 1.7 to 1.0 cm. Significantly less tissue damage as compared to last week. Still with callus and skin accumulation around the orifice. 01/29/2022: The wound continues to contract and the depth is coming in further. We are still working on getting [employer] for him. 02/04/2022: No real change this week. Still working on the [employer]. 02/11/2022: No significant changes again. We are still working on the [employer]. 02/18/2022: No change to the wound. We have submitted in IVR for Epicord. 02/25/2022: Unfortunately, his wound has deteriorated over the past week. It is deeper and is approaching bone once again. There is more slough accumulation and the tissue shows signs of pressure injury. He says that he has been trying to wear his  new work boots, which are waterproof. He says they make his feet sweat excessively. I think our main challenge with him is that we just are not able to adequately offload him while he is still working and on his feet through much of the day. He is willing to consider total contact casting, but will need to make some arrangements with work. On a more positive note, however, he has been approved for Epicord. 03/03/2022: The wound probed a little bit deeper again today. We are planning to put him in a total contact cast as soon as he has cleared it with work to take some time  off. 03/10/2022: The wound was a little bit shallower today and the overall appearance is significantly improved. We are planning to put him in a cast today. 03/12/2022: He is here for his first total contact cast change. He denies any issues with pressure, rubbing, or pain. 03/17/2022: For some reason, the wound probes deeper today. There is some slough and nonviable subcutaneous tissue around the opening. He denies any difficulty with the total contact cast. 03/24/2022: The wound is shallower today. There is better granulation tissue visible on the wound surface. 03/31/2022: His cast protector got a hole in it and his cast got wet. His foot is macerated. The wound measured a little deeper today. There is some hypertrophic granulation tissue at the orifice of his wound. 3/18; patient presents for follow-up. He had no issues with the total contact cast. We have been using epi cord to the wound bed. Overall wound is stable. Patient denies signs of infection. 04/14/2022: The depth of the wound has rather remarkably decreased to 1.3 cm today. The orifice of the wound has also contracted quite a bit. 04/21/2022: The depth of the wound has decreased even further. The orifice is also smaller. The wound is very clean. 04/28/2022: The depth of the wound is down to 0.5 cm. The orifice continues to contract. 05/06/2022: The primary wound is substantially smaller and shallower today. Unfortunately, he developed a blister distal to this on the plantar surface of his foot. The tissue breakdown underneath the blister does extend into the fat layer in 1 area over a bony prominence from his collapsed arch. The remainder of the tissue underneath the blister is intact skin. 05/12/2022: The primary wound is down to just a couple of millimeters and is flush with the surrounding skin. The area that had blistered last week had some purulent drainage coming follow-up a corner that was underneath some dry skin. No malodor associated  with this. There is some additional slough that has accumulated. 05/19/2022: His primary wound has actually healed. The area that opened after a blister formed is smaller. There is a little bit of thickened skin around the edges, no concern for infection. [name], [name] ([id number]) [id number] Page 9 of 12 05/26/2022: The wound is stable. Unfortunately, for some reason the primary dressing was left off and there was no silver alginate applied. There was also no zinc oxide applied to the periwound so there is some tissue maceration present due to the fact that the only thing used on the wound was a foam donut, which retains the moisture from his foot. 06/02/2022: The wound measured 2 mm wider today. There is still some blue-green staining on the surface. 06/09/2022: No change to the wound dimensions. The culture that I took last week grew out Pseudomonas aeruginosa and Enterococcus faecalis. Levofloxacin was prescribed and he began taking this last Wednesday. 06/18/2022: He completed  his levofloxacin yesterday. Today, the wound has not changed in its dimensions. There is callus accumulation around the wound and some slough on the wound surface. For what ever reason, we do not seem to be getting the offloading affect from the total contact cast that one might expect. 06/23/2022: The wound is a little bit smaller today. There is some undermining created by accumulated callus, but underneath the callus, there is epithelialization. Minimal slough on the surface. He has been approved for Epicord, so we will apply that today. He seems to be getting adequate offloading using the peg assist insert in his shoe. 06/30/2022: The wound is smaller today. There is no undermining. The wound surface looks healthier. Still with some periwound callus accumulation. 07/07/2022: The wound is about the same size. The center of the wound is fairly soft and mushy, however. 07/21/2022: He was out of town  last week and apparently did quite a bit more walking than he had anticipated. As a result, opening the wound is smaller but he has developed significant undermining, roughly 1.5 cm nearly circumferentially. There is no malodor or purulent drainage. There is heavy callus accumulation around the wound. 07/28/2022: There has been further breakdown and undermining that has occurred. The muscle is now exposed but there does not appear to be any evidence of necrosis. No malodor or purulent drainage. Patient History Information obtained from Patient. Social History Never smoker, Marital Status - Married, Alcohol Use - Never, Drug Use - No History, Caffeine Use - Daily - T coffee. ea; Medical History Endocrine Patient has history of Type II Diabetes Hospitalization/Surgery History - Amuptation of 3rd,4th and 5th toes of Right foot;Oral Surgery;Anal Fissure surgery; Cholecystectomy. Objective Constitutional Hypertensive, asymptomatic. no acute distress. Vitals Time Taken: 8:41 AM, Height: 72 in, Weight: 312 lbs, BMI: 42.3, Temperature: 98.5 F, Pulse: 76 bpm, Respiratory Rate: 18 breaths/min, Blood Pressure: 165/95 mmHg, Capillary Blood Glucose: 145 mg/dl. Respiratory Normal work of breathing on room air. General Notes: 07/28/2022: There has been further breakdown and undermining that has occurred. The muscle is now exposed but there does not appear to be any evidence of necrosis. No malodor or purulent drainage. Integumentary (Hair, Skin) Wound #4 status is Open. Original cause of wound was Blister. The date acquired was: 05/06/2022. The wound has been in treatment 11 weeks. The wound is located on the Right,Distal,Lateral Foot. The wound measures 1cm length x 1.1cm width x 0.5cm depth; 0.864cm^2 area and 0.432cm^3 volume. There is muscle and Fat Layer (Subcutaneous Tissue) exposed. There is undermining starting at 12:00 and ending at 12:00 with a maximum distance of 1.9cm. There is a medium amount of  serosanguineous drainage noted. The wound margin is distinct with the outline attached to the wound base. There is large (67-100%) red granulation within the wound bed. There is a small (1-33%) amount of necrotic tissue within the wound bed including Adherent Slough. The periwound skin appearance had no abnormalities noted for color. The periwound skin appearance exhibited: Callus. The periwound skin appearance did not exhibit: Dry/Scaly, Maceration. Periwound temperature was noted as No Abnormality. 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  Severity of Tissue Pre Debridement is: Muscle involvement without necrosis. There was a Selective/Open Wound Non-Viable Tissue Debridement with a total area of 0.86 sq cm performed by [name] Guess, MD. With the following instrument(s): Curette Material removed includes Callus and Slough and after achieving pain control using Lidocaine 4% T opical Solution. 1 specimen was taken by a Tissue Culture and sent to the lab per facility protocol. A time out was conducted at 09:00, prior to the start of the procedure. A Minimum amount of bleeding was controlled with Pressure. The procedure was tolerated well. Post Debridement Measurements: 1cm length x 1.1cm width x 0.5cm depth; 0.432cm^3 volume. Character of Wound/Ulcer Post Debridement is improved. Severity of Tissue Post Debridement is: Muscle involvement without necrosis. Post procedure Diagnosis Wound #4: Same as  Pre-Procedure General Notes: scribed for Dr. Lady Gary by [name], RN. Plan Follow-up Appointments: Return Appointment in 1 week. - Dr. Lady Gary - room 2 Anesthetic: (In clinic) Topical Lidocaine 4% applied to wound bed Cellular or Tissue Based Products: Cellular or Tissue Based Product Type: - Epicord #3 - ON HOLD 07/21/2022 Cellular or Tissue Based Product applied to wound bed, secured with steri-strips, cover with Adaptic or Mepitel. (DO NOT REMOVE). Bathing/ Shower/ Hygiene: May shower and wash wound with soap and water. Off-Loading: Open toe surgical shoe to: - with peg assist insert, put insert into work boot when working Additional Orders / Instructions: Follow Nutritious Diet - incorporate protein shakes Other: - vitamin C 500 mg 3 times a day and zinc 30-50 mg a day Juven Shake 1-2 times daily. Consults ordered were: Vascular - vascular studies including formal ABIs and doppler arterial studies due to nonhealing wound to right foot Laboratory ordered were: Anaerobic culture - PCR of nonhealing wound to right foot The following medication(s) was prescribed: lidocaine topical 4 % cream cream topical was prescribed at facility mupirocin topical 2 % ointment Use as directed for dressing changes starting 07/28/2022 WOUND #4: - Foot Wound Laterality: Right, Lateral, Distal Cleanser: Soap and Water 1 x Per Day/30 Days Discharge Instructions: May shower and wash wound with dial antibacterial soap and water prior to dressing change. Cleanser: Wound Cleanser (Generic) 1 x Per Day/30 Days Discharge Instructions: Cleanse the wound with wound cleanser prior to applying a clean dressing using gauze sponges, not tissue or cotton balls. Topical: Gentamicin 1 x Per Day/30 Days Discharge Instructions: As directed by physician Topical: Mupirocin Ointment 1 x Per Day/30 Days Discharge Instructions: Apply Mupirocin (Bactroban) as instructed Prim Dressing: Hydrofera Blue Ready Transfer Foam,  2.5x2.5 (in/in) (Generic) 1 x Per Day/30 Days ary Discharge Instructions: Apply directly to wound bed as directed Secondary Dressing: Optifoam Non-Adhesive Dressing, 4x4 in (Generic) 1 x Per Day/30 Days Discharge Instructions: Apply over primary dressing as directed. Secondary Dressing: Woven Gauze Sponge, Non-Sterile 4x4 in 1 x Per Day/30 Days Discharge Instructions: Apply over primary dressing as directed. Secured With: Paper T ape, 2x10 (in/yd) (Generic) 1 x Per Day/30 Days Discharge Instructions: Secure dressing with tape as directed. Com pression Wrap: Kerlix Roll 4.5x3.1 (in/yd) (Generic) 1 x Per Day/30 Days Discharge Instructions: Apply Kerlix and Coban compression as directed. 07/28/2022: There has been further breakdown and undermining that has occurred. The muscle is now exposed but there does not appear to be any evidence of necrosis. No malodor or purulent drainage. I used a curette to debride callus and slough from his wound. I then took a culture to see if there is some undetected infection that is preventing forward progress  with this wound. Going to add mupirocin to the topical gentamicin we are using and continue to pack the wound with Hydrofera Blue. I am also going to get repeat arterial studies, as they have not been done in over a year. He may end up needing surgical intervention and I asked him to communicate with his podiatrist regarding this possibility. Due to his Charcot deformities, the site of the wound is his main weightbearing surface on the foot. We may also need to reconsider putting him back in a total contact cast. Follow-up in 1 week. Electronic Signature(s) Signed: 07/28/2022 9:51:42 AM By: [name] Guess MD FACS Entered By: [name] on 07/28/2022 09:51:42 [name], [name] ([id number]) [id number] Page 11 of 12 -------------------------------------------------------------------------------- HxROS Details Patient Name: Date of  Service: [name], [name] 07/28/2022 8:30 [name] Medical Record Number: [id number] Patient Account Number: [id number] Date of Birth/Sex: Treating RN: 12-09-1970 (52 y.o. [name] Primary Care Provider: Tana [name] Other Clinician: Referring Provider: Treating Provider/Extender: Arvin Collard in Treatment: 25 Information Obtained From Patient Endocrine Medical History: Positive for: Type II Diabetes Immunizations Pneumococcal Vaccine: Received Pneumococcal Vaccination: Yes Received Pneumococcal Vaccination On or After 60th Birthday: No Implantable Devices None Hospitalization / Surgery History Type of Hospitalization/Surgery Amuptation of 3rd,4th and 5th toes of Right foot;Oral Surgery;Anal Fissure surgery; Cholecystectomy Family and Social History Never smoker; Marital Status - Married; Alcohol Use: Never; Drug Use: No History; Caffeine Use: Daily - T coffee; Financial Concerns: No; Food, ea; Clothing or Shelter Needs: No; Support System Lacking: No; Transportation Concerns: No Electronic Signature(s) Signed: 07/28/2022 10:20:35 AM By: [name] Guess MD FACS Entered By: [name] on 07/28/2022 09:47:30 -------------------------------------------------------------------------------- SuperBill Details Patient Name: Date of Service: [name], [name] 07/28/2022 Medical Record Number: [id number] Patient Account Number: [id number] Date of Birth/Sex: Treating RN: 1971-01-02 (52 y.o. 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, right ankle and foot E11.621 Type 2  diabetes mellitus with foot ulcer Facility Procedures [name], [name] ([id number]): CPT4 Code Description [id number] [phone] - DEBRIDE WOUND 1ST 20 SQ CM OR < ICD-10 Diagnosis Description 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 [id number] Page 12 of 12: Modifier Quantity 1 involvement without evidence of necrosis Physician Procedures : CPT4 Code Description Modifier [id number] - WC PHYS LEVEL 4 - EST PT 25 ICD-10 Diagnosis Description 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

## 2022-07-30 ENCOUNTER — Other Ambulatory Visit (HOSPITAL_COMMUNITY): Payer: Self-pay | Admitting: General Surgery

## 2022-07-30 ENCOUNTER — Ambulatory Visit (HOSPITAL_COMMUNITY)
Admission: RE | Admit: 2022-07-30 | Discharge: 2022-07-30 | Disposition: A | Discharge status: Home / Self Care | Payer: BLUE CROSS/BLUE SHIELD | Source: Ambulatory Visit | Attending: Vascular Surgery | Admitting: Vascular Surgery

## 2022-07-30 DIAGNOSIS — E11621 Type 2 diabetes mellitus with foot ulcer: Secondary | ICD-10-CM

## 2022-07-30 DIAGNOSIS — M869 Osteomyelitis, unspecified: Secondary | ICD-10-CM | POA: Diagnosis not present

## 2022-07-30 DIAGNOSIS — L97509 Non-pressure chronic ulcer of other part of unspecified foot with unspecified severity: Secondary | ICD-10-CM | POA: Insufficient documentation

## 2022-07-30 DIAGNOSIS — E1169 Type 2 diabetes mellitus with other specified complication: Secondary | ICD-10-CM | POA: Insufficient documentation

## 2022-07-30 LAB — VAS US ABI WITH/WO TBI
Left ABI: 1.14
Right ABI: 1.05

## 2022-08-01 ENCOUNTER — Telehealth: Payer: Self-pay | Admitting: Podiatry

## 2022-08-01 NOTE — Telephone Encounter (Signed)
Orthotics in. Lvm for pt to call to schedule an appt to pick them up. °

## 2022-08-04 ENCOUNTER — Encounter (HOSPITAL_BASED_OUTPATIENT_CLINIC_OR_DEPARTMENT_OTHER): Payer: BLUE CROSS/BLUE SHIELD | Admitting: General Surgery

## 2022-08-04 DIAGNOSIS — E11621 Type 2 diabetes mellitus with foot ulcer: Secondary | ICD-10-CM | POA: Diagnosis not present

## 2022-08-04 NOTE — Progress Notes (Signed)
[name], [name] ([id number]) [id number] Page 1 of 8 Visit Report for 08/04/2022 Arrival Information Details Patient Name: Date of Service: [place], [name] 08/04/2022 8:30 A M Medical Record Number: [id number] Patient Account Number: [id number] Date of Birth/Sex: Treating RN: 1970-03-02 (52 y.o. M) Primary Care Daisi Kentner: Tana Conch Other Clinician: Referring Niklaus Mamaril: Treating Lasharon Dunivan/Extender: Arvin Collard in Treatment: 56 Visit Information History Since Last Visit All ordered tests and consults were completed: No Patient Arrived: Ambulatory Added or deleted any medications: No Arrival Time: 08:40 Any new allergies or adverse reactions: No Accompanied By: self Had a fall or experienced change in No Transfer Assistance: None activities of daily living that may affect Patient Identification Verified: Yes risk of falls: Secondary Verification Process Completed: Yes Signs or symptoms of abuse/neglect since No Patient Requires Transmission-Based Precautions: No last visito Patient Has Alerts: Yes Hospitalized since last visit: No Implantable device outside of the clinic No excluding cellular tissue based products placed in the center since last visit: Has Dressing in Place as Prescribed: Yes Has Footwear/Offloading in Place as Yes Prescribed: Right: Surgical Shoe with Pressure Relief Insole Pain Present Now: No Electronic Signature(s) Signed: 08/04/2022 5:04:17 PM By: Zenaida Deed RN, BSN Entered By: Zenaida Deed on 08/04/2022 08:44:36 -------------------------------------------------------------------------------- Encounter Discharge Information Details Patient Name: Date of Service: [name], [name] 08/04/2022 8:30 A M Medical Record Number: [id number] Patient Account Number: [id number] Date of Birth/Sex: Treating RN: 07-May-1970 (51 y.o. Damaris Schooner Primary Care Dominiqua Cooner: Tana Conch Other  Clinician: Referring Imanie Darrow: Treating Mikeala Girdler/Extender: Arvin Collard in Treatment: 25 Encounter Discharge Information Items Post Procedure Vitals Discharge Condition: Stable Temperature (F): 98.1 Ambulatory Status: Ambulatory Pulse (bpm): 81 Discharge Destination: Home Respiratory Rate (breaths/min): 18 Transportation: Private Auto Blood Pressure (mmHg): 140/83 Accompanied By: self Schedule Follow-up Appointment: Yes Clinical Summary of Care: Electronic Signature(s) Signed: 08/04/2022 5:04:17 PM By: Zenaida Deed RN, BSN Entered By: Zenaida Deed on 08/04/2022 09:13:19 [name], Malike M ([id number]) [id number] Page 2 of 8 -------------------------------------------------------------------------------- Lower Extremity Assessment Details Patient Name: Date of Service: [name], [name] 08/04/2022 8:30 A M Medical Record Number: [id number] Patient Account Number: [id number] Date of Birth/Sex: Treating RN: 1970-10-19 (52 y.o. Damaris Schooner Primary Care Christinia Lambeth: Tana Conch Other Clinician: Referring Shanaye Rief: Treating Romaldo Saville/Extender: Arvin Collard in Treatment: 56 Edema Assessment Assessed: Kyra Searles: No] [Right: No] Edema: [Left: N] [Right: o] Calf Left: Right: Point of Measurement: From Medial Instep 40 cm Ankle Left: Right: Point of Measurement: From Medial Instep 24 cm Vascular Assessment Pulses: Dorsalis Pedis Palpable: [Right:Yes] Extremity colors, hair growth, and conditions: Extremity Color: [Right:Hyperpigmented] Hair Growth on Extremity: [Right:No] Temperature of Extremity: [Right:Warm < 3 seconds] Electronic Signature(s) Signed: 08/04/2022 5:04:17 PM By: Zenaida Deed RN, BSN Entered By: Zenaida Deed on 08/04/2022 08:45:38 -------------------------------------------------------------------------------- Multi Wound Chart Details Patient Name: Date of Service: [place]  [place], [name] 08/04/2022 8:30 A M Medical Record Number: [id number] Patient Account Number: [id number] Date of Birth/Sex: Treating RN: Feb 06, 1970 (52 y.o. M) Primary Care Magdalene Tardiff: Tana Conch Other Clinician: Referring Jeannette Maddy: Treating Amelianna Meller/Extender: Arvin Collard in Treatment: 56 Vital Signs Height(in): 72 Capillary Blood Glucose(mg/dl): 536 Weight(lbs): 644 Pulse(bpm): 81 Body Mass Index(BMI): 42.3 Blood Pressure(mmHg): 140/83 Temperature(F): 98.1 Respiratory Rate(breaths/min): 18 [4:Photos:] [N/A:N/A] Right, Distal, Lateral Foot N/A N/A Wound Location: Blister N/A N/A Wounding Event: Diabetic Wound/Ulcer of the Lower N/A N/A Primary Etiology: Extremity Type II Diabetes N/A N/A Comorbid History: 05/06/2022 N/A N/A Date Acquired:  12 N/A N/A Weeks of Treatment: Open N/A N/A Wound Status: No N/A N/A Wound Recurrence: 1.1x1.5x0.4 N/A N/A Measurements L x W x D (cm) 1.296 N/A N/A A (cm) : rea 0.518 N/A N/A Volume (cm) : 17.50% N/A N/A % Reduction in A rea: -229.90% N/A N/A % Reduction in Volume: 12 Starting Position 1 (o'clock): 12 Ending Position 1 (o'clock): 1.4 Maximum Distance 1 (cm): Yes N/A N/A Undermining: Grade 2 N/A N/A Classification: Medium N/A N/A Exudate A mount: Serosanguineous N/A N/A Exudate Type: red, brown N/A N/A Exudate Color: Well defined, not attached N/A N/A Wound Margin: Large (67-100%) N/A N/A Granulation A mount: Red N/A N/A Granulation Quality: Small (1-33%) N/A N/A Necrotic A mount: Fat Layer (Subcutaneous Tissue): Yes N/A N/A Exposed Structures: Muscle: Yes Fascia: No Tendon: No Joint: No Bone: No Small (1-33%) N/A N/A Epithelialization: Debridement - Selective/Open Wound N/A N/A Debridement: Pre-procedure Verification/Time Out 08:55 N/A N/A Taken: Lidocaine 4% T opical Solution N/A N/A Pain Control: Callus, Slough N/A N/A Tissue Debrided: Skin/Epidermis N/A  N/A Level: 1.98 N/A N/A Debridement A (sq cm): rea Curette N/A N/A Instrument: Minimum N/A N/A Bleeding: Pressure N/A N/A Hemostasis A chieved: 0 N/A N/A Procedural Pain: 0 N/A N/A Post Procedural Pain: Procedure was tolerated well N/A N/A Debridement Treatment Response: 1.4x1.8x0.4 N/A N/A Post Debridement Measurements L x W x D (cm) 0.792 N/A N/A Post Debridement Volume: (cm) Callus: Yes N/A N/A Periwound Skin Texture: Maceration: No N/A N/A Periwound Skin Moisture: Dry/Scaly: No No Abnormalities Noted N/A N/A Periwound Skin Color: No Abnormality N/A N/A Temperature: Debridement N/A N/A Procedures Performed: Treatment Notes Wound #4 (Foot) Wound Laterality: Right, Lateral, Distal Cleanser Soap and Water Discharge Instruction: May shower and wash wound with dial antibacterial soap and water prior to dressing change. Wound Cleanser Discharge Instruction: Cleanse the wound with wound cleanser prior to applying a clean dressing using gauze sponges, not tissue or cotton balls. Peri-Wound Care Topical Gentamicin Discharge Instruction: As directed by physician Mupirocin Ointment Discharge Instruction: Apply Mupirocin (Bactroban) as instructed [name], Charlotte M ([id number]) [id number] Page 4 of 8 Primary Dressing Hydrofera Blue Ready Transfer Foam, 2.5x2.5 (in/in) Discharge Instruction: Apply directly to wound bed as directed Secondary Dressing Optifoam Non-Adhesive Dressing, 4x4 in Discharge Instruction: Apply over primary dressing as directed. Woven Gauze Sponge, Non-Sterile 4x4 in Discharge Instruction: Apply over primary dressing as directed. Secured With Paper Tape, 2x10 (in/yd) Discharge Instruction: Secure dressing with tape as directed. Compression Wrap Kerlix Roll 4.5x3.1 (in/yd) Discharge Instruction: Apply Kerlix and Coban compression as directed. Compression Stockings Add-Ons Electronic Signature(s) Signed: 08/04/2022  9:14:05 AM By: Duanne Guess MD FACS Entered By: Duanne Guess on 08/04/2022 09:14:05 -------------------------------------------------------------------------------- Multi-Disciplinary Care Plan Details Patient Name: Date of Service: [name], [name] 08/04/2022 8:30 A M Medical Record Number: [id number] Patient Account Number: [id number] Date of Birth/Sex: Treating RN: 1970/07/27 (52 y.o. Damaris Schooner Primary Care Grigor Lipschutz: Tana Conch Other Clinician: Referring Yanely Mast: Treating Idelia Caudell/Extender: Arvin Collard in Treatment: 56 Multidisciplinary Care Plan reviewed with physician Active Inactive Nutrition Nursing Diagnoses: Impaired glucose control: actual or potential Potential for alteratiion in Nutrition/Potential for imbalanced nutrition Goals: Patient/caregiver will maintain therapeutic glucose control Date Initiated: 07/29/2021 Target Resolution Date: 09/19/2022 Goal Status: Active Interventions: Assess patient nutrition upon admission and as needed per policy Provide education on elevated blood sugars and impact on wound healing Treatment Activities: Patient referred to Primary Care Physician for further nutritional evaluation : 07/29/2021 Notes: Wound/Skin Impairment Nursing Diagnoses: Impaired tissue integrity Goals:  [name], [place] ([id number]) [id number] Page 5 of 8 Patient/caregiver will verbalize understanding of skin care regimen Date Initiated: 07/04/2021 Target Resolution Date: 09/19/2022 Goal Status: Active Interventions: Assess ulceration(s) every visit Treatment Activities: Skin care regimen initiated : 07/04/2021 Notes: Electronic Signature(s) Signed: 08/04/2022 5:04:17 PM By: Zenaida Deed RN, BSN Entered By: Zenaida Deed on 08/04/2022 08:48:08 -------------------------------------------------------------------------------- Pain Assessment Details Patient Name: Date of  Service: [name], [name] 08/04/2022 8:30 A M Medical Record Number: [id number] Patient Account Number: [id number] Date of Birth/Sex: Treating RN: 1970-11-02 (52 y.o. M) Primary Care Miriam Liles: Tana Conch Other Clinician: Referring Tekia Waterbury: Treating Raguel Kosloski/Extender: Arvin Collard in Treatment: 56 Active Problems Location of Pain Severity and Description of Pain Patient Has Paino No Site Locations Rate the pain. Current Pain Level: 0 Pain Management and Medication Current Pain Management: Electronic Signature(s) Signed: 08/04/2022 5:04:17 PM By: Zenaida Deed RN, BSN Entered By: Zenaida Deed on 08/04/2022 08:44:55 -------------------------------------------------------------------------------- Patient/Caregiver Education Details Patient Name: Date of Service: Oletta Darter, [name] 7/15/2024andnbsp8:30 A M [name], Hernan M ([id number]) [id number] Page 6 of 8 Medical Record Number: [id number] Patient Account Number: [id number] Date of Birth/Gender: Treating RN: [birth year] (52 y.o. Damaris Schooner Primary Care Physician: Tana Conch Other Clinician: Referring Physician: Treating Physician/Extender: Arvin Collard in Treatment: 60 Education Assessment Education Provided To: Patient Education Topics Provided Offloading: Methods: Explain/Verbal Responses: Reinforcements needed, State content correctly Wound/Skin Impairment: Methods: Explain/Verbal Responses: Reinforcements needed, State content correctly Electronic Signature(s) Signed: 08/04/2022 5:04:17 PM By: Zenaida Deed RN, BSN Entered By: Zenaida Deed on 08/04/2022 08:48:30 -------------------------------------------------------------------------------- Wound Assessment Details Patient Name: Date of Service: [name], [name] 08/04/2022 8:30 A M Medical Record Number: [id number] Patient Account Number: [id number] Date of  Birth/Sex: Treating RN: Sep 25, 1970 (52 y.o. Damaris Schooner Primary Care Kensy Blizard: Tana Conch Other Clinician: Referring Wilmarie Sparlin: Treating Nikeshia Keetch/Extender: Arvin Collard in Treatment: 56 Wound Status Wound Number: 4 Primary Etiology: Diabetic Wound/Ulcer of the Lower Extremity Wound Location: Right, Distal, Lateral Foot Wound Status: Open Wounding Event: Blister Comorbid History: Type II Diabetes Date Acquired: 05/06/2022 Weeks Of Treatment: 12 Clustered Wound: No Photos Wound Measurements Length: (cm) 1.1 Width: (cm) 1.5 Depth: (cm) 0.4 Area: (cm) 1.296 Volume: (cm) 0.518 [name], Samnang M ([id number]) % Reduction in Area: 17.5% % Reduction in Volume: -229.9% Epithelialization: Small (1-33%) Tunneling: No Undermining: Yes Starting Position (o'clock): 12 Ending Position (o'clock): 12 [id number] Page 7 of 8 Maximum Distance: (cm) 1.4 Wound Description Classification: Grade 2 Wound Margin: Well defined, not attached Exudate Amount: Medium Exudate Type: Serosanguineous Exudate Color: red, brown Foul Odor After Cleansing: No Slough/Fibrino Yes Wound Bed Granulation Amount: Large (67-100%) Exposed Structure Granulation Quality: Red Fascia Exposed: No Necrotic Amount: Small (1-33%) Fat Layer (Subcutaneous Tissue) Exposed: Yes Necrotic Quality: Adherent Slough Tendon Exposed: No Muscle Exposed: Yes Necrosis of Muscle: No Joint Exposed: No Bone Exposed: No Periwound Skin Texture Texture Color No Abnormalities Noted: No No Abnormalities Noted: Yes Callus: Yes Temperature / Pain Temperature: No Abnormality Moisture No Abnormalities Noted: No Dry / Scaly: No Maceration: No Treatment Notes Wound #4 (Foot) Wound Laterality: Right, Lateral, Distal Cleanser Soap and Water Discharge Instruction: May shower and wash wound with dial antibacterial soap and water prior to dressing change. Wound  Cleanser Discharge Instruction: Cleanse the wound with wound cleanser prior to applying a clean dressing using gauze sponges, not tissue or cotton balls. Peri-Wound Care Topical Gentamicin Discharge Instruction: As directed by physician Mupirocin Ointment Discharge Instruction: Apply  Mupirocin (Bactroban) as instructed Primary Dressing Hydrofera Blue Ready Transfer Foam, 2.5x2.5 (in/in) Discharge Instruction: Apply directly to wound bed as directed Secondary Dressing Optifoam Non-Adhesive Dressing, 4x4 in Discharge Instruction: Apply over primary dressing as directed. Woven Gauze Sponge, Non-Sterile 4x4 in Discharge Instruction: Apply over primary dressing as directed. Secured With Paper Tape, 2x10 (in/yd) Discharge Instruction: Secure dressing with tape as directed. Compression Wrap Kerlix Roll 4.5x3.1 (in/yd) Discharge Instruction: Apply Kerlix and Coban compression as directed. Compression Stockings Add-Ons Electronic Signature(s) Signed: 08/04/2022 11:13:19 AM By: Dayton Scrape Signed: 08/04/2022 5:04:17 PM By: Zenaida Deed RN, BSN [name], [place] M ([id number]) [id number] Page 8 of 8 Entered By: Dayton Scrape on 08/04/2022 08:48:11 -------------------------------------------------------------------------------- Vitals Details Patient Name: Date of Service: [name], [name] 08/04/2022 8:30 A M Medical Record Number: [id number] Patient Account Number: [id number] Date of Birth/Sex: Treating RN: Mar 23, 1970 (52 y.o. M) Primary Care Weylin Plagge: Tana Conch Other Clinician: Referring Shyteria Lewis: Treating Alveta Quintela/Extender: Arvin Collard in Treatment: 56 Vital Signs Time Taken: 08:40 Temperature (F): 98.1 Height (in): 72 Pulse (bpm): 81 Weight (lbs): 312 Respiratory Rate (breaths/min): 18 Body Mass Index (BMI): 42.3 Blood Pressure (mmHg): 140/83 Capillary Blood Glucose (mg/dl): 132 Reference Range: 80 - 120 mg /  dl Notes glucose per pt report this am Electronic Signature(s) Signed: 08/04/2022 5:04:17 PM By: Zenaida Deed RN, BSN Entered By: Zenaida Deed on 08/04/2022 08:44:49

## 2022-08-04 NOTE — Progress Notes (Signed)
[name], [name] ([id number]) [id number] Page 1 of 11 Visit Report for 08/04/2022 Chief Complaint Document Details Patient Name: Date of Service: [name], [name] 08/04/2022 8:30 [name] Medical Record Number: [id number] Patient Account Number: [id number] Date of Birth/Sex: Treating RN: Mar 18, 1970 (52 y.o. [name] Primary Care Provider: Tana Conch Other Clinician: Referring Provider: Treating Provider/Extender: Arvin Collard in Treatment: 56 Information Obtained from: Patient Chief Complaint Patients presents for treatment of an open diabetic ulcer Electronic Signature(s) Signed: 08/04/2022 9:14:39 AM By: Duanne Guess MD FACS Entered By: Duanne Guess on 08/04/2022 09:14:39 -------------------------------------------------------------------------------- Debridement Details Patient Name: Date of Service: [name], [name] 08/04/2022 8:30 [name] Medical Record Number: [id number] Patient Account Number: [id number] Date of Birth/Sex: Treating RN: 05-01-1970 (52 y.o. [name] Primary Care Provider: Tana Conch Other Clinician: Referring Provider: Treating Provider/Extender: Arvin Collard in Treatment: 56 Debridement Performed for Assessment: Wound #4 Right,Distal,Lateral Foot Performed By: Physician Duanne Guess, MD Debridement Type: Debridement Severity of Tissue Pre Debridement: Fat layer exposed Level of Consciousness (Pre-procedure): Awake and Alert Pre-procedure Verification/Time Out Yes - 08:55 Taken: Start Time: 08:57 Pain Control: Lidocaine 4% Topical Solution Percent of Wound Bed Debrided: 100% T Area Debrided (cm): otal 1.98 Tissue and other material debrided: Non-Viable, Callus, Slough, Skin: Epidermis, Slough Level: Skin/Epidermis Debridement Description: Selective/Open Wound Instrument: Curette Bleeding: Minimum Hemostasis Achieved: Pressure Procedural Pain: 0 Post Procedural Pain:  0 Response to Treatment: Procedure was tolerated well Level of Consciousness (Post- Awake and Alert procedure): Post Debridement Measurements of Total Wound Length: (cm) 1.4 Width: (cm) 1.8 Depth: (cm) 0.4 Volume: (cm) 0.792 Character of Wound/Ulcer Post Debridement: Improved Severity of Tissue Post Debridement: Fat layer exposed [name], [name] ([id number]) [id number] Page 2 of 11 Post Procedure Diagnosis Same as Pre-procedure Notes scribed for Dr. Lady Gary by Zenaida Deed, RN Electronic Signature(s) Signed: 08/04/2022 9:14:33 AM By: Duanne Guess MD FACS Entered By: Duanne Guess on 08/04/2022 09:14:33 -------------------------------------------------------------------------------- HPI Details Patient Name: Date of Service: [name], [name] 08/04/2022 8:30 [name] Medical Record Number: [id number] Patient Account Number: [id number] Date of Birth/Sex: Treating RN: 1970/09/26 (52 y.o. 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 The most recent amputation was in August 2022. At that operation, antibiotic beads were placed in the wound. He has been managed by podiatry for his procedures and management of his wounds. He has been in a Scientist, water quality. He is on oral doxycycline. They have been using Betadine wet to dry dressings along with Iodosorb. The patient states that when he thinks the wound is getting too dry, he applies topical Neosporin. At his last visit, on June 7 of this year, the podiatrist determined that he felt the wound was stalled and referred him to wound care for additional  evaluation and management. An MRI has been ordered, but not yet scheduled or performed. Pathology from his operation in August 2022 demonstrated findings consistent with chronic osteomyelitis. Today, there is a large irregular wound on the plantar surface of his right foot, at about the level of the fifth metatarsal head. This tracks through to a pinpoint opening on the dorsal lateral portion of his foot. The intake nurse reported purulent  drainage. There is some malodor from the wound. No frank necrosis identified. 07/11/2021: Today, the wounds do not connect. I attempted multiple times from various directions and the shared tunnel is no longer open. He has some slough accumulation on the dorsal part of his foot as well as slough and callus buildup on the plantar surface. His MRI is scheduled for June 29. No purulent drainage or malodor appreciated today. 07/19/2021: The lateral foot wound has closed and there is no tunnel connecting the plantar foot wound to that site. The plantar foot wound still probes quite deeply, however. There is some slough, eschar, and nonviable tissue accumulated in the wound bed. No malodorous drainage present. His MRI was performed yesterday and is consistent with fairly extensive osteomyelitis. 07/29/2021: The patient has an appointment with infectious disease on July 18 to treat his osteomyelitis. The plantar wound still probes quite deeply, approaching bone. The orifice has narrowed quite substantially, however, making it more difficult for him to pack. 08/05/2021: The tunnel connecting the lateral foot wound to the plantar foot wound has reopened. He sees infectious disease tomorrow to discuss long-term antibiotic treatment for osteomyelitis. There was a bit of murky drainage in the wound, but this was noted after he had had topical lidocaine applied so may have just been a blob of the anesthetic. No odor or frank purulent drainage. The wound probes deeply at the midfoot  approaching bone. He does have MRI results confirming his diagnosis of osteomyelitis. He has failed to progress with conventional treatment and I think his best chance for preservation of the foot is to initiate hyperbaric oxygen therapy. 08/13/2021: The tunnel connecting the 2 wounds has closed again. He has a PICC line and is getting IV daptomycin and cefepime. EKG and chest x-ray are within normal limits. He is scheduled to start hyperbaric oxygen therapy tomorrow. The wound in his midfoot probes deeply, approaching bone. The skin at the orifice continues to heaped up and threatens to close over despite the large cavity within. No erythema, induration, or purulent drainage. The wound on his lateral foot is fairly small and quite clean. 08/19/2021: The lateral foot wound has nearly closed. The wound in his midfoot does not probe quite as deeply today. The skin at the orifice continues to try to roll in and obscure the opening. No frankly necrotic tissue appreciated. He is tolerating hyperbaric oxygen therapy. 08/26/2021: The lateral foot wound has closed completely. The wound in his midfoot is shallower again today. The wound orifice is contracting. We are using gentamicin and silver alginate. He continues on IV daptomycin and cefepime and is tolerating hyperbaric oxygen therapy without difficulty. 09/02/2021: His foot is a little bit sore today but he was up walking on it all weekend doing back to school shopping with his daughter. The tunneling is down to about 3 cm and I do not appreciate bone at the tip of the probe. The wound is clean but has some callus creating an overhanging lip at the distal aspect. He continues to receive hyperbaric oxygen therapy as well as IV daptomycin and cefepime. 09/09/2021: His wound continues to improve. The tunnel has come in by 0.9 cm. He continues to form callus around the orifice of the wound. He is tolerating hyperbaric oxygen therapy along with his IV  antibiotics. 09/16/2021: The tunneling is down to just half a centimeter. He continues to build up callus around the orifice of the wound. He completed his course of IV antibiotics and his PICC line is  scheduled to be removed this Friday. He is tolerating hyperbaric oxygen without difficulty. We have been applying topical gentamicin and silver alginate to his wound. [name], Askia [name] ([id number]) [id number] Page 3 of 11 09/24/2021: The wound depth has come in even further. There is a little bit of callus buildup around the orifice. The opening is quite narrow, at this point. 09/30/2021: The wound depth is about the same this week. The opening continues to contract with callus accumulation. There is some discoloration of the skin on the lateral part of his foot and he admitted to walking more than usual this weekend and also wearing a different pair shoes. He continues to tolerate hyperbaric oxygen therapy without difficulty. 10/07/2021: The wound depth has contracted a bit and is now approximately 1 to 1.5 cm. The orifice of the wound continues to try and close in over the space. There is just some callus and skin around the margins obscuring the orifice. 10/14/2021: The wound depth has come in a little bit more. The orifice of the wound is rolling inwards with epithelium and callus, making it difficult to pack the wound. 10/21/2021: There is still 1 portion of the wound, at the lateral aspect, that is still a couple of centimeters deep. The architecture of the wound makes this somewhat challenging to access and pack. Everything else looks clean. He has his usual accumulation of callus and skin, narrowing the orifice. 10/29/2021: No significant change to the depth of the wound. The orifice continues to contract with callus and skin narrowing the opening. His wife has been packing the wound and doing an excellent job. 11/05/2021: The depth of the wound has come in by about a  centimeter. There is less callus accumulation around the orificedespite this, the wound is narrower today. We are still awaiting insurance approval for Kerecis powder. 11/12/2021: The depth of the wound continues to contract. He has accumulated some callus around the orifice, as usual. We have received a verbal confirmation that he is approved for Kerecis, but no formal paperwork has yet been received. 11/19/2021: The depth continues to come in. There is callus accumulation around the orifice, as usual. He has been formally approved for Kerecis, but unfortunately we do not have a billing mechanism for the powder in [employer] yet. We are working to address this so we can begin treatment. 11/25/2021: Continued filling in of the depth of the wound. Continued buildup of callus around the orifice. No concern for infection. As we do not have a way to bill for Kerecis powder, but are capable of billing for the [facility] sheet, we are going to use that on him instead. 12/03/2021: The depth of the wound continues to fill in. As usual, he has accumulated callus and skin around the orifice of the wound. No concern for infection. He will complete his hyperbaric oxygen therapy this week. 12/17/2021: The wound depth continues to contract. There is callus and skin accumulation around the orifice of the wound, as per usual. There is more epithelium on the actual wound surface. Unfortunately, his Darlen Round was not ordered. 12/24/2021: The wound depth has come in a little bit more this week. There is less skin accumulation around the orifice, but still some callus. On questioning, he admits to spending a fair amount of time on his feet. 12/12; this is a very difficult wound to assess size slitlike wound with some depth but minimal opening. I applied Kerecis No. 4 12/18; Kerecis No. 5 01/15/2022: His wound measured deeper today. There  is also some evidence of pressure induced tissue injury around the lateral aspect of his foot.  The patient reports that he was wearing different shoes than usual and felt like his foot was rolling a lot over the weekend. There is heavy callus around the orifice of the wound. No concern for infection. 01/21/2022: The depth decreased today from 1.7 to 1.0 cm. Significantly less tissue damage as compared to last week. Still with callus and skin accumulation around the orifice. 01/29/2022: The wound continues to contract and the depth is coming in further. We are still working on getting [employer] for him. 02/04/2022: No real change this week. Still working on the [employer]. 02/11/2022: No significant changes again. We are still working on the [employer]. 02/18/2022: No change to the wound. We have submitted in IVR for Epic[employer]. 02/25/2022: Unfortunately, his wound has deteriorated over the past week. It is deeper and is approaching bone once again. There is more slough accumulation and the tissue shows signs of pressure injury. He says that he has been trying to wear his new work boots, which are waterproof. He says they make his feet sweat excessively. I think our main challenge with him is that we just are not able to adequately offload him while he is still working and on his feet through much of the day. He is willing to consider total contact casting, but will need to make some arrangements with work. On a more positive note, however, he has been approved for Epicord. 03/03/2022: The wound probed a little bit deeper again today. We are planning to put him in a total contact cast as soon as he has cleared it with work to take some time off. 03/10/2022: The wound was a little bit shallower today and the overall appearance is significantly improved. We are planning to put him in a cast today. 03/12/2022: He is here for his first total contact cast change. He denies any issues with pressure, rubbing, or pain. 03/17/2022: For some reason, the wound probes deeper today. There is some slough  and nonviable subcutaneous tissue around the opening. He denies any difficulty with the total contact cast. 03/24/2022: The wound is shallower today. There is better granulation tissue visible on the wound surface. 03/31/2022: His cast protector got a hole in it and his cast got wet. His foot is macerated. The wound measured a little deeper today. There is some hypertrophic granulation tissue at the orifice of his wound. 3/18; patient presents for follow-up. He had no issues with the total contact cast. We have been using epi cord to the wound bed. Overall wound is stable. Patient denies signs of infection. 04/14/2022: The depth of the wound has rather remarkably decreased to 1.3 cm today. The orifice of the wound has also contracted quite a bit. 04/21/2022: The depth of the wound has decreased even further. The orifice is also smaller. The wound is very clean. 04/28/2022: The depth of the wound is down to 0.5 cm. The orifice continues to contract. 05/06/2022: The primary wound is substantially smaller and shallower today. Unfortunately, he developed a blister distal to this on the plantar surface of his foot. The tissue breakdown underneath the blister does extend into the fat layer in 1 area over a bony prominence from his collapsed arch. The remainder of the tissue underneath the blister is intact skin. [name], [name] ([id number]) [id number] Page 4 of 11 05/12/2022: The primary wound is down to just a couple of millimeters and is  flush with the surrounding skin. The area that had blistered last week had some purulent drainage coming follow-up a corner that was underneath some dry skin. No malodor associated with this. There is some additional slough that has accumulated. 05/19/2022: His primary wound has actually healed. The area that opened after a blister formed is smaller. There is a little bit of thickened skin around the edges, no concern for infection. 05/26/2022: The  wound is stable. Unfortunately, for some reason the primary dressing was left off and there was no silver alginate applied. There was also no zinc oxide applied to the periwound so there is some tissue maceration present due to the fact that the only thing used on the wound was a foam donut, which retains the moisture from his foot. 06/02/2022: The wound measured 2 mm wider today. There is still some blue-green staining on the surface. 06/09/2022: No change to the wound dimensions. The culture that I took last week grew out Pseudomonas aeruginosa and Enterococcus faecalis. Levofloxacin was prescribed and he began taking this last Wednesday. 06/18/2022: He completed his levofloxacin yesterday. Today, the wound has not changed in its dimensions. There is callus accumulation around the wound and some slough on the wound surface. For what ever reason, we do not seem to be getting the offloading affect from the total contact cast that one might expect. 06/23/2022: The wound is a little bit smaller today. There is some undermining created by accumulated callus, but underneath the callus, there is epithelialization. Minimal slough on the surface. He has been approved for Epicord, so we will apply that today. He seems to be getting adequate offloading using the peg assist insert in his shoe. 06/30/2022: The wound is smaller today. There is no undermining. The wound surface looks healthier. Still with some periwound callus accumulation. 07/07/2022: The wound is about the same size. The center of the wound is fairly soft and mushy, however. 07/21/2022: He was out of town last week and apparently did quite a bit more walking than he had anticipated. As a result, opening the wound is smaller but he has developed significant undermining, roughly 1.5 cm nearly circumferentially. There is no malodor or purulent drainage. There is heavy callus accumulation around the wound. 07/28/2022: There has been further breakdown and  undermining that has occurred. The muscle is now exposed but there does not appear to be any evidence of necrosis. No malodor or purulent drainage. 08/04/2022: The culture that I took last week grew out a polymicrobial population and doxycycline was the recommended antibiotic for coverage. This was prescribed and he began taking it on Thursday. No real change to his wound. Electronic Signature(s) Signed: 08/04/2022 9:15:22 AM By: Duanne Guess MD FACS Entered By: Duanne Guess on 08/04/2022 09:15:22 -------------------------------------------------------------------------------- Physical Exam Details Patient Name: Date of Service: [name], [name] 08/04/2022 8:30 [name] Medical Record Number: [id number] Patient Account Number: [id number] Date of Birth/Sex: Treating RN: May 18, 1970 (52 y.o. [name] Primary Care Provider: Tana Conch Other Clinician: Referring Provider: Treating Provider/Extender: Arvin Collard in Treatment: 56 Constitutional Slightly hypertensive. . . . no acute distress. Respiratory Normal work of breathing on room air. Notes 08/04/2022: No significant change to the actual wound. There is a buildup of callus around the margin and some undermining created by overlying epidermis. No malodor or purulent drainage. Electronic Signature(s) Signed: 08/04/2022 9:16:26 AM By: Duanne Guess MD FACS Entered By: Duanne Guess on 08/04/2022 09:16:26 [name], Dre [name] ([id number]) [id number] Page 5 of 11 --------------------------------------------------------------------------------  Physician Orders Details Patient Name: Date of Service: [name], [name] 08/04/2022 8:30 [name] Medical Record Number: [id number] Patient Account Number: [id number] Date of Birth/Sex: Treating RN: [birth year] (52 y.o. 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 - Dr. Lady Gary - room 2 ***TCC next week** Return A Monday 7/22 @ 0830 Anesthetic (In clinic) Topical Lidocaine 4% applied to wound bed Bathing/ Shower/ Hygiene May shower and wash wound with soap and water. Off-Loading Open toe surgical shoe to: - with peg assist insert, put insert into work boot when working Additional Orders / Instructions Follow Nutritious Diet - incorporate protein shakes Other: - vitamin C 500 mg 3 times a day and zinc 30-50 mg a day Juven Shake 1-2 times daily. Wound Treatment Wound #4 - Foot Wound Laterality: Right, Lateral, Distal Cleanser: Soap and Water 1 x Per Day/30 Days Discharge Instructions: May shower and wash wound with dial antibacterial soap and water prior to dressing change. Cleanser: Wound Cleanser (Generic) 1 x Per Day/30 Days Discharge Instructions: Cleanse the wound with wound cleanser prior to applying a clean dressing using gauze sponges, not tissue or cotton balls. Topical: Gentamicin 1 x Per Day/30 Days Discharge Instructions: As directed by physician Topical: Mupirocin Ointment 1 x Per Day/30 Days Discharge Instructions: Apply Mupirocin (Bactroban) as instructed Prim Dressing: Hydrofera Blue Ready Transfer Foam, 2.5x2.5 (in/in) (Generic) 1 x Per Day/30 Days ary Discharge Instructions: Apply directly to wound bed as directed Secondary Dressing: Optifoam Non-Adhesive Dressing, 4x4 in (Generic) 1 x Per Day/30 Days Discharge Instructions: Apply over primary dressing as  directed. Secondary Dressing: Woven Gauze Sponge, Non-Sterile 4x4 in 1 x Per Day/30 Days Discharge Instructions: Apply over primary dressing as directed. Secured With: Paper Tape, 2x10 (in/yd) (Generic) 1 x Per Day/30 Days Discharge Instructions: Secure dressing with tape as directed. Compression Wrap: Kerlix Roll 4.5x3.1 (in/yd) (Generic) 1 x Per Day/30 Days Discharge Instructions: Apply Kerlix and Coban compression as directed. Electronic Signature(s) Signed: 08/04/2022 11:41:57 AM By: Duanne Guess MD FACS Entered By: Duanne Guess on 08/04/2022 09:16:37 [name], Torell [name] ([id number]) [id number] Page 6 of 11 -------------------------------------------------------------------------------- Problem List Details Patient Name: Date of Service: [name], [name] 08/04/2022 8:30 [name] Medical Record Number: [id number] Patient Account Number: [id number] Date of Birth/Sex: Treating RN: 03/22/1970 (52 y.o. 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 07/04/2021 No Yes M86.671 Other chronic osteomyelitis, right ankle and foot 07/04/2021 No Yes E11.621 Type 2 diabetes mellitus with foot ulcer 07/04/2021 No Yes Inactive Problems Resolved Problems ICD-10 Code Description Active Date Resolved Date L97.514 Non-pressure chronic ulcer of other part of right foot with necrosis of bone 07/04/2021 07/04/2021 Electronic Signature(s) Signed: 08/04/2022 9:13:58 AM By: Duanne Guess MD FACS Entered By: Duanne Guess on 08/04/2022  09:13:58 -------------------------------------------------------------------------------- Progress Note Details Patient Name: Date of Service: [name], [name] 08/04/2022 8:30 [name] Medical Record Number: [id number] Patient Account Number: [id number] Date of Birth/Sex: Treating RN: 1970-12-19 (52 y.o. 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 The most recent amputation was in August 2022. At that operation, antibiotic beads were placed in the wound. He has been managed by podiatry for his procedures and management of his wounds. He has been in a Scientist, water quality. He is on oral doxycycline. They have been using Betadine wet to dry dressings along with Iodosorb. The patient states that when he thinks the wound is getting too dry, he applies topical Neosporin. At his last visit, on June 7 of this year, the podiatrist determined that he felt the wound was stalled and referred him to wound care for additional evaluation and management. An MRI has been ordered, but not yet scheduled or performed. Pathology from his operation in August 2022 demonstrated findings consistent with chronic osteomyelitis. Today, there is a large irregular wound on the plantar surface of his right foot, at about the level of the fifth metatarsal head. This tracks through to a pinpoint opening on the dorsal lateral portion of his foot. The intake nurse reported purulent  drainage. There is some malodor from the wound. No frank necrosis identified. 07/11/2021: Today, the wounds do not connect. I attempted multiple times from various directions and the shared tunnel is no longer open. He has some slough accumulation on the dorsal part of his foot as well as slough and callus buildup on the plantar surface. His MRI is scheduled for June 29. No purulent drainage or malodor appreciated today. 07/19/2021: The lateral foot wound has closed and there is no tunnel connecting the plantar foot wound to that site. The plantar foot wound still probes quite deeply, however. There is some slough, eschar, and nonviable tissue accumulated in the wound bed. No malodorous drainage present. His MRI was performed yesterday and is consistent with fairly extensive osteomyelitis. 07/29/2021: The patient has an appointment with infectious disease on July 18 to treat his osteomyelitis. The plantar wound still probes quite deeply, approaching bone. The orifice has narrowed quite substantially, however, making it more difficult for him to pack. 08/05/2021: The tunnel connecting the lateral foot wound to the plantar foot wound has reopened. He sees infectious disease tomorrow to discuss long-term antibiotic treatment for osteomyelitis. There was a bit of murky drainage in the wound, but this was noted after he had had topical lidocaine applied so may have just been a blob of the anesthetic. No odor or frank purulent drainage. The wound probes deeply at the midfoot approaching bone. He does have MRI results confirming his diagnosis of osteomyelitis. He has failed to progress with conventional treatment and I think his best chance for preservation of the foot is to initiate hyperbaric oxygen therapy. 08/13/2021: The tunnel connecting the 2 wounds has closed again. He has a PICC line and is getting IV daptomycin and cefepime. EKG and chest x-ray are within normal limits. He  is scheduled to start  hyperbaric oxygen therapy tomorrow. The wound in his midfoot probes deeply, approaching bone. The skin at the orifice continues to heaped up and threatens to close over despite the large cavity within. No erythema, induration, or purulent drainage. The wound on his lateral foot is fairly small and quite clean. 08/19/2021: The lateral foot wound has nearly closed. The wound in his midfoot does not probe quite as deeply today. The skin at the orifice continues to try to roll in and obscure the opening. No frankly necrotic tissue appreciated. He is tolerating hyperbaric oxygen therapy. 08/26/2021: The lateral foot wound has closed completely. The wound in his midfoot is shallower again today. The wound orifice is contracting. We are using gentamicin and silver alginate. He continues on IV daptomycin and cefepime and is tolerating hyperbaric oxygen therapy without difficulty. 09/02/2021: His foot is a little bit sore today but he was up walking on it all weekend doing back to school shopping with his daughter. The tunneling is down to about 3 cm and I do not appreciate bone at the tip of the probe. The wound is clean but has some callus creating an overhanging lip at the distal aspect. He continues to receive hyperbaric oxygen therapy as well as IV daptomycin and cefepime. 09/09/2021: His wound continues to improve. The tunnel has come in by 0.9 cm. He continues to form callus around the orifice of the wound. He is tolerating hyperbaric oxygen therapy along with his IV antibiotics. 09/16/2021: The tunneling is down to just half a centimeter. He continues to build up callus around the orifice of the wound. He completed his course of IV antibiotics and his PICC line is scheduled to be removed this Friday. He is tolerating hyperbaric oxygen without difficulty. We have been applying topical gentamicin and silver alginate to his wound. 09/24/2021: The wound depth has come in even further. There is a little bit of  callus buildup around the orifice. The opening is quite narrow, at this point. 09/30/2021: The wound depth is about the same this week. The opening continues to contract with callus accumulation. There is some discoloration of the skin on the lateral part of his foot and he admitted to walking more than usual this weekend and also wearing a different pair shoes. He continues to tolerate hyperbaric oxygen therapy without difficulty. 10/07/2021: The wound depth has contracted a bit and is now approximately 1 to 1.5 cm. The orifice of the wound continues to try and close in over the space. There is just some callus and skin around the margins obscuring the orifice. 10/14/2021: The wound depth has come in a little bit more. The orifice of the wound is rolling inwards with epithelium and callus, making it difficult to pack the wound. 10/21/2021: There is still 1 portion of the wound, at the lateral aspect, that is still a couple of centimeters deep. The architecture of the wound makes this somewhat challenging to access and pack. Everything else looks clean. He has his usual accumulation of callus and skin, narrowing the orifice. 10/29/2021: No significant change to the depth of the wound. The orifice continues to contract with callus and skin narrowing the opening. His wife has been packing the wound and doing an excellent job. 11/05/2021: The depth of the wound has come in by about a centimeter. There is less callus accumulation around the orificedespite this, the wound is narrower today. We are still awaiting insurance approval for Kerecis powder. 11/12/2021: The depth of  the wound continues to contract. He has accumulated some callus around the orifice, as usual. We have received a verbal confirmation that he is approved for Kerecis, but no formal paperwork has yet been received. 11/19/2021: The depth continues to come in. There is callus accumulation around the orifice, as usual. He has been formally  approved for Kerecis, but unfortunately we do not have a billing mechanism for the powder in [employer] yet. We are working to address this so we can begin treatment. 11/25/2021: Continued filling in of the depth of the wound. Continued buildup of callus around the orifice. No concern for infection. As we do not have a way to bill for Kerecis powder, but are capable of billing for the [facility] sheet, we are going to use that on him instead. 12/03/2021: The depth of the wound continues to fill in. As usual, he has accumulated callus and skin around the orifice of the wound. No concern for infection. He will complete his hyperbaric oxygen therapy this week. 12/17/2021: The wound depth continues to contract. There is callus and skin accumulation around the orifice of the wound, as per usual. There is more epithelium on the actual wound surface. Unfortunately, his Darlen Round was not ordered. [name], [name] ([id number]) [id number] Page 8 of 11 12/24/2021: The wound depth has come in a little bit more this week. There is less skin accumulation around the orifice, but still some callus. On questioning, he admits to spending a fair amount of time on his feet. 12/12; this is a very difficult wound to assess size slitlike wound with some depth but minimal opening. I applied Kerecis No. 4 12/18; Kerecis No. 5 01/15/2022: His wound measured deeper today. There is also some evidence of pressure induced tissue injury around the lateral aspect of his foot. The patient reports that he was wearing different shoes than usual and felt like his foot was rolling a lot over the weekend. There is heavy callus around the orifice of the wound. No concern for infection. 01/21/2022: The depth decreased today from 1.7 to 1.0 cm. Significantly less tissue damage as compared to last week. Still with callus and skin accumulation around the orifice. 01/29/2022: The wound continues to contract and the depth is  coming in further. We are still working on getting [employer] for him. 02/04/2022: No real change this week. Still working on the [employer]. 02/11/2022: No significant changes again. We are still working on the [employer]. 02/18/2022: No change to the wound. We have submitted in IVR for Epicord. 02/25/2022: Unfortunately, his wound has deteriorated over the past week. It is deeper and is approaching bone once again. There is more slough accumulation and the tissue shows signs of pressure injury. He says that he has been trying to wear his new work boots, which are waterproof. He says they make his feet sweat excessively. I think our main challenge with him is that we just are not able to adequately offload him while he is still working and on his feet through much of the day. He is willing to consider total contact casting, but will need to make some arrangements with work. On a more positive note, however, he has been approved for Epicord. 03/03/2022: The wound probed a little bit deeper again today. We are planning to put him in a total contact cast as soon as he has cleared it with work to take some time off. 03/10/2022: The wound was a little bit shallower today and the overall appearance  is significantly improved. We are planning to put him in a cast today. 03/12/2022: He is here for his first total contact cast change. He denies any issues with pressure, rubbing, or pain. 03/17/2022: For some reason, the wound probes deeper today. There is some slough and nonviable subcutaneous tissue around the opening. He denies any difficulty with the total contact cast. 03/24/2022: The wound is shallower today. There is better granulation tissue visible on the wound surface. 03/31/2022: His cast protector got a hole in it and his cast got wet. His foot is macerated. The wound measured a little deeper today. There is some hypertrophic granulation tissue at the orifice of his wound. 3/18; patient  presents for follow-up. He had no issues with the total contact cast. We have been using epi cord to the wound bed. Overall wound is stable. Patient denies signs of infection. 04/14/2022: The depth of the wound has rather remarkably decreased to 1.3 cm today. The orifice of the wound has also contracted quite a bit. 04/21/2022: The depth of the wound has decreased even further. The orifice is also smaller. The wound is very clean. 04/28/2022: The depth of the wound is down to 0.5 cm. The orifice continues to contract. 05/06/2022: The primary wound is substantially smaller and shallower today. Unfortunately, he developed a blister distal to this on the plantar surface of his foot. The tissue breakdown underneath the blister does extend into the fat layer in 1 area over a bony prominence from his collapsed arch. The remainder of the tissue underneath the blister is intact skin. 05/12/2022: The primary wound is down to just a couple of millimeters and is flush with the surrounding skin. The area that had blistered last week had some purulent drainage coming follow-up a corner that was underneath some dry skin. No malodor associated with this. There is some additional slough that has accumulated. 05/19/2022: His primary wound has actually healed. The area that opened after a blister formed is smaller. There is a little bit of thickened skin around the edges, no concern for infection. 05/26/2022: The wound is stable. Unfortunately, for some reason the primary dressing was left off and there was no silver alginate applied. There was also no zinc oxide applied to the periwound so there is some tissue maceration present due to the fact that the only thing used on the wound was a foam donut, which retains the moisture from his foot. 06/02/2022: The wound measured 2 mm wider today. There is still some blue-green staining on the surface. 06/09/2022: No change to the wound dimensions. The culture that I took last week grew  out Pseudomonas aeruginosa and Enterococcus faecalis. Levofloxacin was prescribed and he began taking this last Wednesday. 06/18/2022: He completed his levofloxacin yesterday. Today, the wound has not changed in its dimensions. There is callus accumulation around the wound and some slough on the wound surface. For what ever reason, we do not seem to be getting the offloading affect from the total contact cast that one might expect. 06/23/2022: The wound is a little bit smaller today. There is some undermining created by accumulated callus, but underneath the callus, there is epithelialization. Minimal slough on the surface. He has been approved for Epicord, so we will apply that today. He seems to be getting adequate offloading using the peg assist insert in his shoe. 06/30/2022: The wound is smaller today. There is no undermining. The wound surface looks healthier. Still with some periwound callus accumulation. 07/07/2022: The wound is about the  same size. The center of the wound is fairly soft and mushy, however. 07/21/2022: He was out of town last week and apparently did quite a bit more walking than he had anticipated. As a result, opening the wound is smaller but he has developed significant undermining, roughly 1.5 cm nearly circumferentially. There is no malodor or purulent drainage. There is heavy callus accumulation around the wound. 07/28/2022: There has been further breakdown and undermining that has occurred. The muscle is now exposed but there does not appear to be any evidence of necrosis. No malodor or purulent drainage. 08/04/2022: The culture that I took last week grew out a polymicrobial population and doxycycline was the recommended antibiotic for coverage. This was [name], [name] ([id number]) [id number] Page 9 of 11 prescribed and he began taking it on Thursday. No real change to his wound. Patient History Information obtained from Patient. Social  History Never smoker, Marital Status - Married, Alcohol Use - Never, Drug Use - No History, Caffeine Use - Daily - T coffee. ea; Medical History Endocrine Patient has history of Type II Diabetes Hospitalization/Surgery History - Amuptation of 3rd,4th and 5th toes of Right foot;Oral Surgery;Anal Fissure surgery; Cholecystectomy. Objective Constitutional Slightly hypertensive. no acute distress. Vitals Time Taken: 8:40 AM, Height: 72 in, Weight: 312 lbs, BMI: 42.3, Temperature: 98.1 F, Pulse: 81 bpm, Respiratory Rate: 18 breaths/min, Blood Pressure: 140/83 mmHg, Capillary Blood Glucose: 189 mg/dl. General Notes: glucose per pt report this am Respiratory Normal work of breathing on room air. General Notes: 08/04/2022: No significant change to the actual wound. There is a buildup of callus around the margin and some undermining created by overlying epidermis. No malodor or purulent drainage. Integumentary (Hair, Skin) Wound #4 status is Open. Original cause of wound was Blister. The date acquired was: 05/06/2022. The wound has been in treatment 12 weeks. The wound is located on the Right,Distal,Lateral Foot. The wound measures 1.1cm length x 1.5cm width x 0.4cm depth; 1.296cm^2 area and 0.518cm^3 volume. There is muscle and Fat Layer (Subcutaneous Tissue) exposed. There is no tunneling noted, however, there is undermining starting at 12:00 and ending at 12:00 with a maximum distance of 1.4cm. There is a medium amount of serosanguineous drainage noted. The wound margin is well defined and not attached to the wound base. There is large (67-100%) red granulation within the wound bed. There is a small (1-33%) amount of necrotic tissue within the wound bed including Adherent Slough. The periwound skin appearance had no abnormalities noted for color. The periwound skin appearance exhibited: Callus. The periwound skin appearance did not exhibit: Dry/Scaly, Maceration. Periwound temperature was noted as No  Abnormality. 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 There was a Selective/Open Wound Skin/Epidermis Debridement with a total area of 1.98 sq cm performed by Duanne Guess, MD. With the following instrument(s): Curette to remove Non-Viable tissue/material. Material removed includes Callus, Slough, and Skin: Epidermis after achieving pain control using Lidocaine 4% Topical Solution. No specimens were taken. A time out was conducted at 08:55, prior to the start of the procedure. A Minimum amount of bleeding was controlled with Pressure. The procedure  was tolerated well with a pain level of 0 throughout and a pain level of 0 following the procedure. Post Debridement Measurements: 1.4cm length x 1.8cm width x 0.4cm depth; 0.792cm^3 volume. Character of Wound/Ulcer Post Debridement is improved. Severity of Tissue Post Debridement is: Fat layer exposed. Post procedure Diagnosis Wound #4: Same as Pre-Procedure General Notes: scribed for Dr. Lady Gary by Zenaida Deed, RN. [address] [name], [place] ([id number]) [id number] Page 10 of 11 Follow-up Appointments: Return Appointment in 1 week. - Dr. Lady Gary - room 2 ***TCC next week** Monday 7/22 @ 0830 Anesthetic: (In clinic) Topical Lidocaine 4% applied to wound bed Bathing/ Shower/ Hygiene: May shower and wash wound with soap and water. Off-Loading: Open toe surgical shoe to: - with peg assist insert, put insert into work boot when working Additional Orders /  Instructions: Follow Nutritious Diet - incorporate protein shakes Other: - vitamin C 500 mg 3 times a day and zinc 30-50 mg a day Juven Shake 1-2 times daily. WOUND #4: - Foot Wound Laterality: Right, Lateral, Distal Cleanser: Soap and Water 1 x Per Day/30 Days Discharge Instructions: May shower and wash wound with dial antibacterial soap and water prior to dressing change. Cleanser: Wound Cleanser (Generic) 1 x Per Day/30 Days Discharge Instructions: Cleanse the wound with wound cleanser prior to applying a clean dressing using gauze sponges, not tissue or cotton balls. Topical: Gentamicin 1 x Per Day/30 Days Discharge Instructions: As directed by physician Topical: Mupirocin Ointment 1 x Per Day/30 Days Discharge Instructions: Apply Mupirocin (Bactroban) as instructed Prim Dressing: Hydrofera Blue Ready Transfer Foam, 2.5x2.5 (in/in) (Generic) 1 x Per Day/30 Days ary Discharge Instructions: Apply directly to wound bed as directed Secondary Dressing: Optifoam Non-Adhesive Dressing, 4x4 in (Generic) 1 x Per Day/30 Days Discharge Instructions: Apply over primary dressing as directed. Secondary Dressing: Woven Gauze Sponge, Non-Sterile 4x4 in 1 x Per Day/30 Days Discharge Instructions: Apply over primary dressing as directed. Secured With: Paper T ape, 2x10 (in/yd) (Generic) 1 x Per Day/30 Days Discharge Instructions: Secure dressing with tape as directed. Com pression Wrap: Kerlix Roll 4.5x3.1 (in/yd) (Generic) 1 x Per Day/30 Days Discharge Instructions: Apply Kerlix and Coban compression as directed. 08/04/2022: No significant change to the actual wound. There is a buildup of callus around the margin and some undermining created by overlying epidermis. No malodor or purulent drainage. I used a curette to debride callus, skin, and slough from the wound. We will continue to pack the site with [facility]. We discussed reapplying a total contact cast, as we simply are not making progress  with her current management. He agreed to this but since he drove himself today and the wound is on his right foot, we will plan to apply it next week and he will make appropriate transportation arrangements. Electronic Signature(s) Signed: 08/04/2022 9:19:11 AM By: Duanne Guess MD FACS Entered By: Duanne Guess on 08/04/2022 09:19:11 -------------------------------------------------------------------------------- HxROS Details Patient Name: Date of Service: [name], [name] 08/04/2022 8:30 [name] Medical Record Number: [id number] Patient Account Number: [id number] Date of Birth/Sex: Treating RN: [birth year] (52 y.o. [name] Primary Care Provider: Tana Conch Other Clinician: Referring Provider: Treating Provider/Extender: Arvin Collard in Treatment: 56 Information Obtained From Patient Endocrine Medical History: Positive for: Type II Diabetes Immunizations Pneumococcal Vaccine: Received Pneumococcal Vaccination: Yes Received Pneumococcal Vaccination On or After 60th Birthday: No Implantable [address] [name], [place] ([id number]) [id number] Page 11 of 11 None Hospitalization / Surgery History Type of Hospitalization/Surgery Amuptation of 3rd,4th and 5th  toes of Right foot;Oral Surgery;Anal Fissure surgery; Cholecystectomy Family and Social History Never smoker; Marital Status - Married; Alcohol Use: Never; Drug Use: No History; Caffeine Use: Daily - T coffee; Financial Concerns: No; Food, ea; Clothing or Shelter Needs: No; Support System Lacking: No; Transportation Concerns: No Electronic Signature(s) Signed: 08/04/2022 11:41:57 AM By: Duanne Guess MD FACS Entered By: Duanne Guess on 08/04/2022 09:15:29 -------------------------------------------------------------------------------- SuperBill Details Patient Name: Date of Service: [name], [name] 08/04/2022 Medical Record Number: [id number] Patient Account Number:  [id number] Date of Birth/Sex: Treating RN: November 18, 1970 (52 y.o. 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 E11.49 Type 2 diabetes mellitus with other diabetic neurological complication M86.671 Other chronic osteomyelitis, right ankle and foot E11.621 Type 2 diabetes mellitus with foot ulcer Facility Procedures : CPT4 Code: [id number] Description: 97597 - DEBRIDE WOUND 1ST 20 SQ CM OR < ICD-10 Diagnosis Description 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 wit Modifier: hout evidence of n Quantity: 1 ecrosis Physician Procedures : CPT4 Code Description Modifier [id number] - WC PHYS LEVEL 4 - EST PT 25 ICD-10 Diagnosis Description 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

## 2022-08-11 ENCOUNTER — Encounter (HOSPITAL_BASED_OUTPATIENT_CLINIC_OR_DEPARTMENT_OTHER): Payer: BLUE CROSS/BLUE SHIELD | Admitting: General Surgery

## 2022-08-11 DIAGNOSIS — E11621 Type 2 diabetes mellitus with foot ulcer: Secondary | ICD-10-CM | POA: Diagnosis not present

## 2022-08-11 NOTE — Progress Notes (Addendum)
[name], [name] ([id number]) [id number] Page 1 of 8 Visit Report for 08/11/2022 Arrival Information Details Patient Name: Date of Service: [name], [name] [name] 08/11/2022 10:00 [name] Medical Record Number: [id number] Patient Account Number: [id number] Date of Birth/Sex: Treating RN: 11/26/1970 (52 y.o. [name] Primary Care [name]: [name] Other Clinician: Referring [name]: Treating [name]/Extender: [name] in Treatment: 33 Visit Information History Since Last Visit Added or deleted any medications: No Patient Arrived: Ambulatory Any [name] allergies or adverse reactions: No Arrival Time: 10:05 Had a fall or experienced change in No Accompanied By: Self activities of daily living that may affect Transfer Assistance: None risk of falls: Patient Identification Verified: Yes Signs or symptoms of abuse/neglect since last visito No Secondary Verification Process Completed: Yes Hospitalized since last visit: No Patient Requires Transmission-Based Precautions: No Implantable device outside of the clinic excluding No Patient Has Alerts: Yes cellular tissue based products placed in the center since last visit: Has Dressing in Place as Prescribed: Yes Pain Present Now: No Electronic Signature(s) Signed: 08/11/2022 4:45:25 PM By: [name] Entered By: [name] on 08/11/2022 10:05:29 -------------------------------------------------------------------------------- Encounter Discharge Information Details Patient Name: Date of Service: [name], [name] 08/11/2022 10:00 [name] Medical Record Number: [id number] Patient Account Number: [id number] Date of Birth/Sex: Treating RN: 1970-02-15 (52 y.o. [name] Primary Care Ellawyn Wogan: [name] Other Clinician: Referring Krishan Mcbreen: Treating Gracielynn Birkel/Extender: [name] in Treatment: 42 Encounter Discharge  Information Items Discharge Condition: Stable Ambulatory Status: Ambulatory Discharge Destination: Home Transportation: Private Auto Accompanied By: Self Schedule Follow-up Appointment: Yes Clinical Summary of Care: Patient Declined Electronic Signature(s) Signed: 08/11/2022 4:45:25 PM By: [name] Entered By: [name] on 08/11/2022 10:40:47 [name], [name] ([id number]) [id number] Page 2 of 8 -------------------------------------------------------------------------------- Lower Extremity Assessment Details Patient Name: Date of Service: [name], [name] 08/11/2022 10:00 [name] Medical Record Number: [id number] Patient Account Number: [id number] Date of Birth/Sex: Treating RN: 02/08/1970 (52 y.o. [name] Primary Care [facility]: [name] Other Clinician: Referring Madgeline Rayo: Treating Juliauna Stueve/Extender: [name] in Treatment: 57 Edema Assessment Assessed: [name]: No] [name]: No] Edema: [Left: N] [Right: o] Calf Left: Right: Point of Measurement: From Medial Instep 40 cm Ankle Left: Right: Point of Measurement: From Medial Instep 24 cm Vascular Assessment Extremity colors, hair growth, and conditions: Extremity Color: [Right:Hyperpigmented] Hair Growth on Extremity: [Right:No] Temperature of Extremity: [Right:Warm < 3 seconds] Electronic Signature(s) Signed: 08/11/2022 4:45:25 PM By: [name] Entered By: [name] on 08/11/2022 10:07:20 -------------------------------------------------------------------------------- Multi Wound Chart Details Patient Name: Date of Service: [name], [name] 08/11/2022 10:00 [name] Medical Record Number: [id number] Patient Account Number: [id number] Date of Birth/Sex: Treating RN: 04-20-1970 (52 y.o. [name] Primary Care Orpheus Hayhurst: [name] Other Clinician: Referring Zeferino Mounts: Treating Gloriajean Okun/Extender: [name] in Treatment: 57 Vital Signs Height(in): 72 Capillary Blood Glucose(mg/dl): 643 Weight(lbs): 329 Pulse(bpm): 80 Body Mass Index(BMI): 42.3 Blood Pressure(mmHg): 158/89 Temperature(F): 98.2 Respiratory Rate(breaths/min): 16 [4:Photos:] [N/A:N/A] Right, Distal, Lateral Foot N/A N/A Wound Location: Blister N/A N/A Wounding Event: Diabetic Wound/Ulcer of the Lower N/A N/A Primary Etiology: Extremity Type II Diabetes N/A N/A Comorbid History: 05/06/2022 N/A N/A Date Acquired: 13 N/A N/A Weeks of Treatment: Open N/A N/A Wound Status: No N/A N/A Wound Recurrence: 0.9x1.2x0.7 N/A N/A Measurements L x W x D (cm) 0.848 N/A N/A A (cm) : rea 0.594 N/A N/A Volume (cm) : 46.00% N/A N/A % Reduction in A  rea: -278.30% N/A N/A % Reduction in Volume: 12 Starting Position 1 (o'clock): 12 Ending Position 1 (o'clock): 2.4 Maximum Distance 1 (cm): Yes N/A N/A Undermining: Grade 2 N/A N/A Classification: Medium N/A N/A Exudate A mount: Serosanguineous N/A N/A Exudate Type: red, brown N/A N/A Exudate Color: Well defined, not attached N/A N/A Wound Margin: Large (67-100%) N/A N/A Granulation A mount: Red N/A N/A Granulation Quality: Small (1-33%) N/A N/A Necrotic A mount: Fat Layer (Subcutaneous Tissue): Yes N/A N/A Exposed Structures: Muscle: Yes Fascia: No Tendon: No Joint: No Bone: No Small (1-33%) N/A N/A Epithelialization: Callus: Yes N/A N/A Periwound Skin Texture: Maceration: No N/A N/A Periwound Skin Moisture: Dry/Scaly: No No Abnormalities Noted N/A N/A Periwound Skin Color: No Abnormality N/A N/A Temperature: T Contact Cast otal N/A N/A Procedures Performed: Treatment Notes Wound #4 (Foot) Wound Laterality: Right, Lateral, Distal Cleanser Soap and Water Discharge Instruction: May shower and wash wound with dial antibacterial soap and water prior to dressing change. Wound Cleanser Discharge Instruction: Cleanse the wound with  wound cleanser prior to applying a clean dressing using gauze sponges, not tissue or cotton balls. Peri-Wound Care Topical Gentamicin Discharge Instruction: As directed by physician Mupirocin Ointment Discharge Instruction: Apply Mupirocin (Bactroban) as instructed Primary Dressing Hydrofera Blue Ready Transfer Foam, 2.5x2.5 (in/in) Discharge Instruction: Apply directly to wound bed as directed Secondary Dressing Woven Gauze Sponge, Non-Sterile 4x4 in Discharge Instruction: Apply over primary dressing as directed. Secured With Paper Tape, 2x10 (in/yd) Discharge Instruction: Secure dressing with tape as directed. Compression Wrap Kerlix Roll 4.5x3.1 (in/yd) Discharge Instruction: Apply Kerlix and Coban compression as directed. Compression Stockings Add-Ons [name], [name] ([id number]) [id number] Page 4 of 8 Electronic Signature(s) Signed: 08/18/2022 9:51:59 AM By: Duanne Guess MD FACS Entered By: Duanne Guess on 08/18/2022 09:51:59 -------------------------------------------------------------------------------- Multi-Disciplinary Care Plan Details Patient Name: Date of Service: [name], [name] 08/11/2022 10:00 [name] Medical Record Number: [id number] Patient Account Number: [id number] Date of Birth/Sex: Treating RN: 1970/07/26 (52 y.o. [name] Primary Care Collins Dimaria: [name] Other Clinician: Referring Marikay Roads: Treating Kmarion Rawl/Extender: [name] in Treatment: 65 Multidisciplinary Care Plan reviewed with physician Active Inactive Nutrition Nursing Diagnoses: Impaired glucose control: actual or potential Potential for alteratiion in Nutrition/Potential for imbalanced nutrition Goals: Patient/caregiver will maintain therapeutic glucose control Date Initiated: 07/29/2021 Target Resolution Date: 09/19/2022 Goal Status: Active Interventions: Assess patient nutrition upon admission and as needed  per policy Provide education on elevated blood sugars and impact on wound healing Treatment Activities: Patient referred to Primary Care Physician for further nutritional evaluation : 07/29/2021 Notes: Wound/Skin Impairment Nursing Diagnoses: Impaired tissue integrity Goals: Patient/caregiver will verbalize understanding of skin care regimen Date Initiated: 07/04/2021 Target Resolution Date: 09/19/2022 Goal Status: Active Interventions: Assess ulceration(s) every visit Treatment Activities: Skin care regimen initiated : 07/04/2021 Notes: Electronic Signature(s) Signed: 08/11/2022 4:45:25 PM By: [name] Entered By: [name] on 08/11/2022 10:39:44 [name], [name] ([id number]) [id number] Page 5 of 8 -------------------------------------------------------------------------------- Pain Assessment Details Patient Name: Date of Service: [name], [name] 08/11/2022 10:00 [name] Medical Record Number: [id number] Patient Account Number: [id number] Date of Birth/Sex: Treating RN: October 22, 1970 (52 y.o. [name] Primary Care Beni Turrell: [name] Other Clinician: Referring Mckaylin Bastien: Treating Zakry Caso/Extender: [name] in Treatment: 48 Active Problems Location of Pain Severity and Description of Pain Patient Has Paino No Site Locations Rate the pain. Current Pain Level: 0 Pain Management and Medication Current Pain Management: Electronic Signature(s) Signed: 08/11/2022 4:45:25 PM By:  [name] Entered By: [name] on 08/11/2022 10:07:13 -------------------------------------------------------------------------------- Patient/Caregiver Education Details Patient Name: Date of Service: [name], [name] 7/22/2024andnbsp10:00 [name] Medical Record Number: [id number] Patient Account Number: [id number] Date of Birth/Gender: Treating RN: 09-25-1970 (52 y.o. [name] Primary  Care Physician: [name] Other Clinician: Referring Physician: Treating Physician/Extender: [name] in Treatment: 71 Education Assessment Education Provided To: Patient Education Topics Provided Offloading: Methods: Explain/Verbal Responses: Reinforcements needed, State content correctly [name], [name] ([id number]) [id number] Page 6 of 8 Electronic Signature(s) Signed: 08/11/2022 4:45:25 PM By: [name] Entered By: [name] on 08/11/2022 10:40:04 -------------------------------------------------------------------------------- Wound Assessment Details Patient Name: Date of Service: [name], [name] 08/11/2022 10:00 [name] Medical Record Number: [id number] Patient Account Number: [id number] Date of Birth/Sex: Treating RN: [birth year] (52 y.o. [name] Primary Care Hernando Reali: [name] Other Clinician: Referring Taran Hable: Treating Travonna Swindle/Extender: [name] in Treatment: 57 Wound Status Wound Number: 4 Primary Etiology: Diabetic Wound/Ulcer of the Lower Extremity Wound Location: Right, Distal, Lateral Foot Wound Status: Open Wounding Event: Blister Comorbid History: Type II Diabetes Date Acquired: 05/06/2022 Weeks Of Treatment: 13 Clustered Wound: No Photos Wound Measurements Length: (cm) 0.9 Width: (cm) 1.2 Depth: (cm) 0.7 Area: (cm) 0.848 Volume: (cm) 0.594 % Reduction in Area: 46% % Reduction in Volume: -278.3% Epithelialization: Small (1-33%) Tunneling: No Undermining: Yes Starting Position (o'clock): 12 Ending Position (o'clock): 12 Maximum Distance: (cm) 2.4 Wound Description Classification: Grade 2 Wound Margin: Well defined, not attached Exudate Amount: Medium Exudate Type: Serosanguineous Exudate Color: red, brown Foul Odor After Cleansing: No Slough/Fibrino Yes Wound Bed Granulation Amount: Large (67-100%) Exposed  Structure Granulation Quality: Red Fascia Exposed: No Necrotic Amount: Small (1-33%) Fat Layer (Subcutaneous Tissue) Exposed: Yes Necrotic Quality: Adherent Slough Tendon Exposed: No Muscle Exposed: Yes Necrosis of Muscle: No Joint Exposed: No Bone Exposed: No [address] [name], [name] ([id number]) [id number] Page 7 of 8 Texture Color No Abnormalities Noted: No No Abnormalities Noted: Yes Callus: Yes Temperature / Pain Temperature: No Abnormality Moisture No Abnormalities Noted: No Dry / Scaly: No Maceration: No Treatment Notes Wound #4 (Foot) Wound Laterality: Right, Lateral, Distal Cleanser Soap and Water Discharge Instruction: May shower and wash wound with dial antibacterial soap and water prior to dressing change. Wound Cleanser Discharge Instruction: Cleanse the wound with wound cleanser prior to applying a clean dressing using gauze sponges, not tissue or cotton balls. Peri-Wound Care Topical Gentamicin Discharge Instruction: As directed by physician Mupirocin Ointment Discharge Instruction: Apply Mupirocin (Bactroban) as instructed Primary Dressing Hydrofera Blue Ready Transfer Foam, 2.5x2.5 (in/in) Discharge Instruction: Apply directly to wound bed as directed Secondary Dressing Woven Gauze Sponge, Non-Sterile 4x4 in Discharge Instruction: Apply over primary dressing as directed. Secured With Paper Tape, 2x10 (in/yd) Discharge Instruction: Secure dressing with tape as directed. Compression Wrap Kerlix Roll 4.5x3.1 (in/yd) Discharge Instruction: Apply Kerlix and Coban compression as directed. Compression Stockings Add-Ons Electronic Signature(s) Signed: 08/11/2022 4:45:25 PM By: [name] Entered By: [name] on 08/11/2022 10:17:49 -------------------------------------------------------------------------------- Vitals Details Patient Name: Date of Service: [name], [name] 08/11/2022 10:00 A  [name] Medical Record Number: [id number] Patient Account Number: [id number] Date of Birth/Sex: Treating RN: Sep 02, 1970 (52 y.o. [name] Primary Care Tyler Cubit: [name] Other Clinician: Referring Sanchez Hemmer: Treating Tyjon Bowen/Extender: [name] in Treatment: 54 Vital Signs Time Taken: 10:07 Temperature (F): 98.2 Height (in): 72 Pulse (bpm): 80 Weight (lbs): 312 Respiratory Rate (breaths/min): 16 Body Mass  Index (BMI): 42.3 Blood Pressure (mmHg): 158/89 Capillary Blood Glucose (mg/dl): 213 [name], [name] ([id number]) [id number] Page 8 of 8 Reference Range: 80 - 120 mg / dl Electronic Signature(s) Signed: 08/11/2022 4:45:25 PM By: [name] Entered By: [name] on 08/11/2022 10:07:57

## 2022-08-11 NOTE — Progress Notes (Addendum)
[name], [name] ([id number]) [id number] Page 1 of 11 Visit Report for 08/11/2022 Chief Complaint Document Details Patient Name: Date of Service: [name], [name] 08/11/2022 10:00 [name] Medical Record Number: [id number] Patient Account Number: [id number] Date of Birth/Sex: Treating RN: Oct 27, 1970 (52 y.o. [name] Primary Care Provider: Tana Conch Other Clinician: Referring Provider: Treating Provider/Extender: Arvin Collard in Treatment: 52 Information Obtained from: Patient Chief Complaint Patients presents for treatment of an open diabetic ulcer Electronic Signature(s) Signed: 08/18/2022 9:54:17 AM By: Duanne Guess MD FACS Entered By: Duanne Guess on 08/18/2022 09:54:17 -------------------------------------------------------------------------------- HPI Details Patient Name: Date of Service: [name], [name] 08/11/2022 10:00 [name] Medical Record Number: [id number] Patient Account Number: [id number] Date of Birth/Sex: Treating RN: 20-Aug-1970 (52 y.o. 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 The most recent amputation was in August 2022. At that operation, antibiotic beads were placed in the wound. He has been managed by podiatry for his procedures and management of his wounds. He has been in a Scient[facility], water quality. He is on oral doxycycline. They have been using Betadine wet to dry dressings along with Iodosorb. The patient states that when he thinks the wound is getting too dry, he applies topical Neosporin. At his last visit, on June 7 of this year, the podiatrist  determined that he felt the wound was stalled and referred him to wound care for additional evaluation and management. An MRI has been ordered, but not yet scheduled or performed. Pathology from his operation in August 2022 demonstrated findings consistent with chronic osteomyelitis. Today, there is a large irregular wound on the plantar surface of his right foot, at about the level of the fifth metatarsal head. This tracks through to a pinpoint opening on the dorsal lateral portion of his foot. The intake nurse reported purulent drainage. There is some malodor from the wound. No frank necrosis identified. 07/11/2021: Today, the wounds do not connect. I attempted multiple times from various directions and the shared tunnel is no longer open. He has some slough accumulation on the dorsal part of his foot as well as slough and callus buildup on the plantar surface. His MRI is scheduled for June 29. No purulent drainage or malodor appreciated today. 07/19/2021: The lateral foot wound has closed and there is no tunnel connecting the plantar foot wound to that site. The plantar foot wound still probes quite deeply, however. There is some slough, eschar, and nonviable tissue accumulated in the wound bed. No malodorous drainage present. His MRI was performed yesterday and is consistent with fairly extensive osteomyelitis. 07/29/2021: The patient has an appointment with infectious disease on July 18 to treat his osteomyelitis. The plantar wound still probes quite deeply, approaching bone. The orifice has narrowed quite substantially, however, making it more difficult for him to pack. 08/05/2021: The tunnel connecting the lateral foot wound to the plantar foot wound has reopened. He sees infectious disease tomorrow to discuss long-term antibiotic treatment for osteomyelitis. There was a bit of murky drainage in the wound, but this was noted after he had had topical lidocaine applied so may have just been a blob  of the anesthetic. No odor or frank purulent drainage.  The wound probes deeply at the midfoot approaching bone. He does have MRI results confirming his diagnosis of osteomyelitis. He has failed to progress with conventional treatment and I think his best chance for preservation of the foot is to initiate hyperbaric oxygen therapy. [name], [name] ([id number]) [id number] Page 2 of 11 08/13/2021: The tunnel connecting the 2 wounds has closed again. He has a PICC line and is getting IV daptomycin and cefepime. EKG and chest x-ray are within normal limits. He is scheduled to start hyperbaric oxygen therapy tomorrow. The wound in his midfoot probes deeply, approaching bone. The skin at the orifice continues to heaped up and threatens to close over despite the large cavity within. No erythema, induration, or purulent drainage. The wound on his lateral foot is fairly small and quite clean. 08/19/2021: The lateral foot wound has nearly closed. The wound in his midfoot does not probe quite as deeply today. The skin at the orifice continues to try to roll in and obscure the opening. No frankly necrotic tissue appreciated. He is tolerating hyperbaric oxygen therapy. 08/26/2021: The lateral foot wound has closed completely. The wound in his midfoot is shallower again today. The wound orifice is contracting. We are using gentamicin and silver alginate. He continues on IV daptomycin and cefepime and is tolerating hyperbaric oxygen therapy without difficulty. 09/02/2021: His foot is a little bit sore today but he was up walking on it all weekend doing back to school shopping with his daughter. The tunneling is down to about 3 cm and I do not appreciate bone at the tip of the probe. The wound is clean but has some callus creating an overhanging lip at the distal aspect. He continues to receive hyperbaric oxygen therapy as well as IV daptomycin and cefepime. 09/09/2021: His wound continues to  improve. The tunnel has come in by 0.9 cm. He continues to form callus around the orifice of the wound. He is tolerating hyperbaric oxygen therapy along with his IV antibiotics. 09/16/2021: The tunneling is down to just half a centimeter. He continues to build up callus around the orifice of the wound. He completed his course of IV antibiotics and his PICC line is scheduled to be removed this Friday. He is tolerating hyperbaric oxygen without difficulty. We have been applying topical gentamicin and silver alginate to his wound. 09/24/2021: The wound depth has come in even further. There is a little bit of callus buildup around the orifice. The opening is quite narrow, at this point. 09/30/2021: The wound depth is about the same this week. The opening continues to contract with callus accumulation. There is some discoloration of the skin on the lateral part of his foot and he admitted to walking more than usual this weekend and also wearing a different pair shoes. He continues to tolerate hyperbaric oxygen therapy without difficulty. 10/07/2021: The wound depth has contracted a bit and is now approximately 1 to 1.5 cm. The orifice of the wound continues to try and close in over the space. There is just some callus and skin around the margins obscuring the orifice. 10/14/2021: The wound depth has come in a little bit more. The orifice of the wound is rolling inwards with epithelium and callus, making it difficult to pack the wound. 10/21/2021: There is still 1 portion of the wound, at the lateral aspect, that is still a couple of centimeters deep. The architecture of the wound makes this somewhat challenging to access and pack. Everything else looks clean. He has his  usual accumulation of callus and skin, narrowing the orifice. 10/29/2021: No significant change to the depth of the wound. The orifice continues to contract with callus and skin narrowing the opening. His wife has been packing the wound and doing  an excellent job. 11/05/2021: The depth of the wound has come in by about a centimeter. There is less callus accumulation around the orificedespite this, the wound is narrower today. We are still awaiting insurance approval for Kerecis powder. 11/12/2021: The depth of the wound continues to contract. He has accumulated some callus around the orifice, as usual. We have received a verbal confirmation that he is approved for Kerecis, but no formal paperwork has yet been received. 11/19/2021: The depth continues to come in. There is callus accumulation around the orifice, as usual. He has been formally approved for Kerecis, but unfortunately we do not have a billing mechanism for the powder in [employer] yet. We are working to address this so we can begin treatment. 11/25/2021: Continued filling in of the depth of the wound. Continued buildup of callus around the orifice. No concern for infection. As we do not have a way to bill for Kerecis powder, but are capable of billing for the [facility] sheet, we are going to use that on him instead. 12/03/2021: The depth of the wound continues to fill in. As usual, he has accumulated callus and skin around the orifice of the wound. No concern for infection. He will complete his hyperbaric oxygen therapy this week. 12/17/2021: The wound depth continues to contract. There is callus and skin accumulation around the orifice of the wound, as per usual. There is more epithelium on the actual wound surface. Unfortunately, his Darlen Round was not ordered. 12/24/2021: The wound depth has come in a little bit more this week. There is less skin accumulation around the orifice, but still some callus. On questioning, he admits to spending a fair amount of time on his feet. 12/12; this is a very difficult wound to assess size slitlike wound with some depth but minimal opening. I applied Kerecis No. 4 12/18; Kerecis No. 5 01/15/2022: His wound measured deeper today. There is also some  evidence of pressure induced tissue injury around the lateral aspect of his foot. The patient reports that he was wearing different shoes than usual and felt like his foot was rolling a lot over the weekend. There is heavy callus around the orifice of the wound. No concern for infection. 01/21/2022: The depth decreased today from 1.7 to 1.0 cm. Significantly less tissue damage as compared to last week. Still with callus and skin accumulation around the orifice. 01/29/2022: The wound continues to contract and the depth is coming in further. We are still working on getting [employer] for him. 02/04/2022: No real change this week. Still working on the [employer]. 02/11/2022: No significant changes again. We are still working on the [employer]. 02/18/2022: No change to the wound. We have submitted in IVR for Epic[employer]. 02/25/2022: Unfortunately, his wound has deteriorated over the past week. It is deeper and is approaching bone once again. There is more slough accumulation and the tissue shows signs of pressure injury. He says that he has been trying to wear his new work boots, which are waterproof. He says they make his feet sweat excessively. I think our main challenge with him is that we just are not able to adequately offload him while he is still working and on his feet through much of the day. He is willing  to consider total contact casting, but will need to make some arrangements with work. On a more positive note, however, he has been approved for Epicord. 03/03/2022: The wound probed a little bit deeper again today. We are planning to put him in a total contact cast as soon as he has cleared it with work to take some time off. 03/10/2022: The wound was a little bit shallower today and the overall appearance is significantly improved. We are planning to put him in a cast today. 03/12/2022: He is here for his first total contact cast change. He denies any issues with pressure, rubbing, or  pain. [name], [name] ([id number]) [id number] Page 3 of 11 03/17/2022: For some reason, the wound probes deeper today. There is some slough and nonviable subcutaneous tissue around the opening. He denies any difficulty with the total contact cast. 03/24/2022: The wound is shallower today. There is better granulation tissue visible on the wound surface. 03/31/2022: His cast protector got a hole in it and his cast got wet. His foot is macerated. The wound measured a little deeper today. There is some hypertrophic granulation tissue at the orifice of his wound. 3/18; patient presents for follow-up. He had no issues with the total contact cast. We have been using epi cord to the wound bed. Overall wound is stable. Patient denies signs of infection. 04/14/2022: The depth of the wound has rather remarkably decreased to 1.3 cm today. The orifice of the wound has also contracted quite a bit. 04/21/2022: The depth of the wound has decreased even further. The orifice is also smaller. The wound is very clean. 04/28/2022: The depth of the wound is down to 0.5 cm. The orifice continues to contract. 05/06/2022: The primary wound is substantially smaller and shallower today. Unfortunately, he developed a blister distal to this on the plantar surface of his foot. The tissue breakdown underneath the blister does extend into the fat layer in 1 area over a bony prominence from his collapsed arch. The remainder of the tissue underneath the blister is intact skin. 05/12/2022: The primary wound is down to just a couple of millimeters and is flush with the surrounding skin. The area that had blistered last week had some purulent drainage coming follow-up a corner that was underneath some dry skin. No malodor associated with this. There is some additional slough that has accumulated. 05/19/2022: His primary wound has actually healed. The area that opened after a blister formed is smaller. There is a little  bit of thickened skin around the edges, no concern for infection. 05/26/2022: The wound is stable. Unfortunately, for some reason the primary dressing was left off and there was no silver alginate applied. There was also no zinc oxide applied to the periwound so there is some tissue maceration present due to the fact that the only thing used on the wound was a foam donut, which retains the moisture from his foot. 06/02/2022: The wound measured 2 mm wider today. There is still some blue-green staining on the surface. 06/09/2022: No change to the wound dimensions. The culture that I took last week grew out Pseudomonas aeruginosa and Enterococcus faecalis. Levofloxacin was prescribed and he began taking this last Wednesday. 06/18/2022: He completed his levofloxacin yesterday. Today, the wound has not changed in its dimensions. There is callus accumulation around the wound and some slough on the wound surface. For what ever reason, we do not seem to be getting the offloading affect from the total contact cast that one might expect.  06/23/2022: The wound is a little bit smaller today. There is some undermining created by accumulated callus, but underneath the callus, there is epithelialization. Minimal slough on the surface. He has been approved for Epicord, so we will apply that today. He seems to be getting adequate offloading using the peg assist insert in his shoe. 06/30/2022: The wound is smaller today. There is no undermining. The wound surface looks healthier. Still with some periwound callus accumulation. 07/07/2022: The wound is about the same size. The center of the wound is fairly soft and mushy, however. 07/21/2022: He was out of town last week and apparently did quite a bit more walking than he had anticipated. As a result, opening the wound is smaller but he has developed significant undermining, roughly 1.5 cm nearly circumferentially. There is no malodor or purulent drainage. There is heavy callus  accumulation around the wound. 07/28/2022: There has been further breakdown and undermining that has occurred. The muscle is now exposed but there does not appear to be any evidence of necrosis. No malodor or purulent drainage. 08/04/2022: The culture that I took last week grew out a polymicrobial population and doxycycline was the recommended antibiotic for coverage. This was prescribed and he began taking it on Thursday. No real change to his wound. 08/11/22: Measured smaller, still with circumferential undermining. Electronic Signature(s) Signed: 08/18/2022 9:54:49 AM By: Duanne Guess MD FACS Previous Signature: 08/18/2022 9:54:28 AM Version By: Duanne Guess MD FACS Previous Signature: 08/15/2022 4:29:54 PM Version By: Thayer Dallas Entered By: Duanne Guess on 08/18/2022 09:54:49 -------------------------------------------------------------------------------- Physical Exam Details Patient Name: Date of Service: [name], [name] 08/11/2022 10:00 [name] Medical Record Number: [id number] Patient Account Number: [id number] Date of Birth/Sex: Treating RN: Jun 05, 1970 (52 y.o. [name] Primary Care Provider: Tana Conch Other Clinician: Referring Provider: Treating Provider/Extender: Arvin Collard in Treatment: 52 Constitutional [name], [name] ([id number]) [id number] Page 4 of 11 Hypertensive, asymptomatic. . . . no acute distress. Respiratory Normal work of breathing on room air. Notes 08/11/22: Undermining worse toward lateral foot 2.4 cm, otherwise very clean. Electronic Signature(s) Signed: 08/18/2022 9:55:35 AM By: Duanne Guess MD FACS Previous Signature: 08/15/2022 4:29:54 PM Version By: Thayer Dallas Entered By: Duanne Guess on 08/18/2022 09:55:35 -------------------------------------------------------------------------------- Physician Orders Details Patient Name: Date of Service: [name], [name] 08/11/2022 10:00  [name] Medical Record Number: [id number] Patient Account Number: [id number] Date of Birth/Sex: Treating RN: 05-Oct-1970 (52 y.o. 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 - Dr. Lady Gary - room 2 Return A Anesthetic (In clinic) Topical Lidocaine 4% applied to wound bed Bathing/ Shower/ Hygiene May shower with protection but do not get wound dressing(s) wet. Protect dressing(s) with water repellant cover (for example, large plastic bag) or a cast cover and may then take shower. Off-Loading Total Contact Cast to Right Lower Extremity Removable cast walker boot to: Additional Orders / Instructions Follow Nutritious Diet - incorporate protein shakes Other: - vitamin C 500 mg 3 times a day and zinc 30-50 mg a day Juven Shake 1-2 times daily. Wound Treatment  Wound #4 - Foot Wound Laterality: Right, Lateral, Distal Cleanser: Soap and Water 1 x Per Day/30 Days Discharge Instructions: May shower and wash wound with dial antibacterial soap and water prior to dressing change. Cleanser: Wound Cleanser (Generic) 1 x Per Day/30 Days Discharge Instructions: Cleanse the wound with wound cleanser prior to applying a clean dressing using gauze sponges, not tissue or cotton balls. Topical: Gentamicin 1 x Per Day/30 Days Discharge Instructions: As directed by physician Topical: Mupirocin Ointment 1 x Per Day/30 Days Discharge Instructions: Apply Mupirocin  (Bactroban) as instructed Prim Dressing: Hydrofera Blue Ready Transfer Foam, 2.5x2.5 (in/in) (Generic) 1 x Per Day/30 Days ary Discharge Instructions: Apply directly to wound bed as directed [name], [name] ([id number]) [id number] Page 5 of 11 Secondary Dressing: Woven Gauze Sponge, Non-Sterile 4x4 in 1 x Per Day/30 Days Discharge Instructions: Apply over primary dressing as directed. Secured With: Paper Tape, 2x10 (in/yd) (Generic) 1 x Per Day/30 Days Discharge Instructions: Secure dressing with tape as directed. Compression Wrap: Kerlix Roll 4.5x3.1 (in/yd) (Generic) 1 x Per Day/30 Days Discharge Instructions: Apply Kerlix and Coban compression as directed. Patient Medications llergies: No Known Allergies A Notifications Medication Indication Start End 08/11/2022 lidocaine DOSE topical 4 % cream - cream topical Electronic Signature(s) Signed: 08/18/2022 10:26:58 AM By: Duanne Guess MD FACS Previous Signature: 08/11/2022 11:13:49 AM Version By: Duanne Guess MD FACS Previous Signature: 08/11/2022 4:45:25 PM Version By: [name] Entered By: Duanne Guess on 08/18/2022 09:55:49 -------------------------------------------------------------------------------- Problem List Details Patient Name: Date of Service: [name], [name] 08/11/2022 10:00 [name] Medical Record Number: [id number] Patient Account Number: [id number] Date of Birth/Sex: Treating RN: 30-Nov-1970 (52 y.o. 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 07/04/2021 No Yes M86.671  Other chronic osteomyelitis, right ankle and foot 07/04/2021 No Yes E11.621 Type 2 diabetes mellitus with foot ulcer 07/04/2021 No Yes Inactive Problems Resolved Problems ICD-10 Code Description Active Date Resolved Date L97.514 Non-pressure chronic ulcer of other part of right foot with necrosis of bone 07/04/2021 07/04/2021 [name], [name] ([id number]) [id number] Page 6 of 11 Electronic Signature(s) Signed: 08/18/2022 9:51:43 AM By: Duanne Guess MD FACS Previous Signature: 08/11/2022 12:00:53 PM Version By: Duanne Guess MD FACS Entered By: Duanne Guess on 08/18/2022 09:51:43 -------------------------------------------------------------------------------- Progress Note Details Patient Name: Date of Service: [name], [name] 08/11/2022 10:00 [name] Medical Record Number: [id number] Patient Account Number: [id number] Date of Birth/Sex: Treating RN: [birth year] (52 y.o. 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 The most recent amputation was in August 2022. At that operation, antibiotic beads were placed in the wound. He has been managed by podiatry for his procedures and management of his wounds. He has been in a Scientist, water quality. He is on oral doxycycline. They have been using Betadine wet to dry dressings along with  Iodosorb. The patient states that when he thinks the wound is getting too dry, he applies topical Neosporin. At his last visit, on June 7 of this year, the podiatrist  determined that he felt the wound was stalled and referred him to wound care for additional evaluation and management. An MRI has been ordered, but not yet scheduled or performed. Pathology from his operation in August 2022 demonstrated findings consistent with chronic osteomyelitis. Today, there is a large irregular wound on the plantar surface of his right foot, at about the level of the fifth metatarsal head. This tracks through to a pinpoint opening on the dorsal lateral portion of his foot. The intake nurse reported purulent drainage. There is some malodor from the wound. No frank necrosis identified. 07/11/2021: Today, the wounds do not connect. I attempted multiple times from various directions and the shared tunnel is no longer open. He has some slough accumulation on the dorsal part of his foot as well as slough and callus buildup on the plantar surface. His MRI is scheduled for June 29. No purulent drainage or malodor appreciated today. 07/19/2021: The lateral foot wound has closed and there is no tunnel connecting the plantar foot wound to that site. The plantar foot wound still probes quite deeply, however. There is some slough, eschar, and nonviable tissue accumulated in the wound bed. No malodorous drainage present. His MRI was performed yesterday and is consistent with fairly extensive osteomyelitis. 07/29/2021: The patient has an appointment with infectious disease on July 18 to treat his osteomyelitis. The plantar wound still probes quite deeply, approaching bone. The orifice has narrowed quite substantially, however, making it more difficult for him to pack. 08/05/2021: The tunnel connecting the lateral foot wound to the plantar foot wound has reopened. He sees infectious disease tomorrow to discuss long-term antibiotic treatment for osteomyelitis. There was a bit of murky drainage in the wound, but this was noted after he had had topical lidocaine applied so may have just been a blob  of the anesthetic. No odor or frank purulent drainage. The wound probes deeply at the midfoot approaching bone. He does have MRI results confirming his diagnosis of osteomyelitis. He has failed to progress with conventional treatment and I think his best chance for preservation of the foot is to initiate hyperbaric oxygen therapy. 08/13/2021: The tunnel connecting the 2 wounds has closed again. He has a PICC line and is getting IV daptomycin and cefepime. EKG and chest x-ray are within normal limits. He is scheduled to start hyperbaric oxygen therapy tomorrow. The wound in his midfoot probes deeply, approaching bone. The skin at the orifice continues to heaped up and threatens to close over despite the large cavity within. No erythema, induration, or purulent drainage. The wound on his lateral foot is fairly small and quite clean. 08/19/2021: The lateral foot wound has nearly closed. The wound in his midfoot does not probe quite as deeply today. The skin at the orifice continues to try to roll in and obscure the opening. No frankly necrotic tissue appreciated. He is tolerating hyperbaric oxygen therapy. 08/26/2021: The lateral foot wound has closed completely. The wound in his midfoot is shallower again today. The wound orifice is contracting. We are using gentamicin and silver alginate. He continues on IV daptomycin and cefepime and is tolerating hyperbaric oxygen therapy without difficulty. 09/02/2021: His foot is a little bit sore today but he was up walking on it all weekend doing back to school shopping with his daughter. The tunneling is  down to about 3 cm and I do not appreciate bone at the tip of the probe. The wound is clean but has some callus creating an overhanging lip at the distal aspect. He continues to receive hyperbaric oxygen therapy as well as IV daptomycin and cefepime. 09/09/2021: His wound continues to improve. The tunnel has come in by 0.9 cm. He continues to form callus around the  orifice of the wound. He is tolerating hyperbaric oxygen therapy along with his IV antibiotics. 09/16/2021: The tunneling is down to just half a centimeter. He continues to build up callus around the orifice of the wound. He completed his course of IV antibiotics and his PICC line is scheduled to be removed this Friday. He is tolerating hyperbaric oxygen without difficulty. We have been applying topical gentamicin and silver alginate to his wound. 09/24/2021: The wound depth has come in even further. There is a little bit of callus buildup around the orifice. The opening is quite narrow, at this point. [name], [name] ([id number]) [id number] Page 7 of 11 09/30/2021: The wound depth is about the same this week. The opening continues to contract with callus accumulation. There is some discoloration of the skin on the lateral part of his foot and he admitted to walking more than usual this weekend and also wearing a different pair shoes. He continues to tolerate hyperbaric oxygen therapy without difficulty. 10/07/2021: The wound depth has contracted a bit and is now approximately 1 to 1.5 cm. The orifice of the wound continues to try and close in over the space. There is just some callus and skin around the margins obscuring the orifice. 10/14/2021: The wound depth has come in a little bit more. The orifice of the wound is rolling inwards with epithelium and callus, making it difficult to pack the wound. 10/21/2021: There is still 1 portion of the wound, at the lateral aspect, that is still a couple of centimeters deep. The architecture of the wound makes this somewhat challenging to access and pack. Everything else looks clean. He has his usual accumulation of callus and skin, narrowing the orifice. 10/29/2021: No significant change to the depth of the wound. The orifice continues to contract with callus and skin narrowing the opening. His wife has been packing the wound and doing  an excellent job. 11/05/2021: The depth of the wound has come in by about a centimeter. There is less callus accumulation around the orificedespite this, the wound is narrower today. We are still awaiting insurance approval for Kerecis powder. 11/12/2021: The depth of the wound continues to contract. He has accumulated some callus around the orifice, as usual. We have received a verbal confirmation that he is approved for Kerecis, but no formal paperwork has yet been received. 11/19/2021: The depth continues to come in. There is callus accumulation around the orifice, as usual. He has been formally approved for Kerecis, but unfortunately we do not have a billing mechanism for the powder in [employer] yet. We are working to address this so we can begin treatment. 11/25/2021: Continued filling in of the depth of the wound. Continued buildup of callus around the orifice. No concern for infection. As we do not have a way to bill for Kerecis powder, but are capable of billing for the [facility] sheet, we are going to use that on him instead. 12/03/2021: The depth of the wound continues to fill in. As usual, he has accumulated callus and skin around the orifice of the wound. No concern for infection. He  will complete his hyperbaric oxygen therapy this week. 12/17/2021: The wound depth continues to contract. There is callus and skin accumulation around the orifice of the wound, as per usual. There is more epithelium on the actual wound surface. Unfortunately, his Darlen Round was not ordered. 12/24/2021: The wound depth has come in a little bit more this week. There is less skin accumulation around the orifice, but still some callus. On questioning, he admits to spending a fair amount of time on his feet. 12/12; this is a very difficult wound to assess size slitlike wound with some depth but minimal opening. I applied Kerecis No. 4 12/18; Kerecis No. 5 01/15/2022: His wound measured deeper today. There is also some  evidence of pressure induced tissue injury around the lateral aspect of his foot. The patient reports that he was wearing different shoes than usual and felt like his foot was rolling a lot over the weekend. There is heavy callus around the orifice of the wound. No concern for infection. 01/21/2022: The depth decreased today from 1.7 to 1.0 cm. Significantly less tissue damage as compared to last week. Still with callus and skin accumulation around the orifice. 01/29/2022: The wound continues to contract and the depth is coming in further. We are still working on getting [employer] for him. 02/04/2022: No real change this week. Still working on the [employer]. 02/11/2022: No significant changes again. We are still working on the [employer]. 02/18/2022: No change to the wound. We have submitted in IVR for Epicord. 02/25/2022: Unfortunately, his wound has deteriorated over the past week. It is deeper and is approaching bone once again. There is more slough accumulation and the tissue shows signs of pressure injury. He says that he has been trying to wear his new work boots, which are waterproof. He says they make his feet sweat excessively. I think our main challenge with him is that we just are not able to adequately offload him while he is still working and on his feet through much of the day. He is willing to consider total contact casting, but will need to make some arrangements with work. On a more positive note, however, he has been approved for Epicord. 03/03/2022: The wound probed a little bit deeper again today. We are planning to put him in a total contact cast as soon as he has cleared it with work to take some time off. 03/10/2022: The wound was a little bit shallower today and the overall appearance is significantly improved. We are planning to put him in a cast today. 03/12/2022: He is here for his first total contact cast change. He denies any issues with pressure, rubbing, or  pain. 03/17/2022: For some reason, the wound probes deeper today. There is some slough and nonviable subcutaneous tissue around the opening. He denies any difficulty with the total contact cast. 03/24/2022: The wound is shallower today. There is better granulation tissue visible on the wound surface. 03/31/2022: His cast protector got a hole in it and his cast got wet. His foot is macerated. The wound measured a little deeper today. There is some hypertrophic granulation tissue at the orifice of his wound. 3/18; patient presents for follow-up. He had no issues with the total contact cast. We have been using epi cord to the wound bed. Overall wound is stable. Patient denies signs of infection. 04/14/2022: The depth of the wound has rather remarkably decreased to 1.3 cm today. The orifice of the wound has also contracted quite a bit.  04/21/2022: The depth of the wound has decreased even further. The orifice is also smaller. The wound is very clean. 04/28/2022: The depth of the wound is down to 0.5 cm. The orifice continues to contract. 05/06/2022: The primary wound is substantially smaller and shallower today. Unfortunately, he developed a blister distal to this on the plantar surface of his foot. The tissue breakdown underneath the blister does extend into the fat layer in 1 area over a bony prominence from his collapsed arch. The remainder of the tissue underneath the blister is intact skin. 05/12/2022: The primary wound is down to just a couple of millimeters and is flush with the surrounding skin. The area that had blistered last week had some [name], [name] ([id number]) [id number] Page 8 of 11 purulent drainage coming follow-up a corner that was underneath some dry skin. No malodor associated with this. There is some additional slough that has accumulated. 05/19/2022: His primary wound has actually healed. The area that opened after a blister formed is smaller. There is a little  bit of thickened skin around the edges, no concern for infection. 05/26/2022: The wound is stable. Unfortunately, for some reason the primary dressing was left off and there was no silver alginate applied. There was also no zinc oxide applied to the periwound so there is some tissue maceration present due to the fact that the only thing used on the wound was a foam donut, which retains the moisture from his foot. 06/02/2022: The wound measured 2 mm wider today. There is still some blue-green staining on the surface. 06/09/2022: No change to the wound dimensions. The culture that I took last week grew out Pseudomonas aeruginosa and Enterococcus faecalis. Levofloxacin was prescribed and he began taking this last Wednesday. 06/18/2022: He completed his levofloxacin yesterday. Today, the wound has not changed in its dimensions. There is callus accumulation around the wound and some slough on the wound surface. For what ever reason, we do not seem to be getting the offloading affect from the total contact cast that one might expect. 06/23/2022: The wound is a little bit smaller today. There is some undermining created by accumulated callus, but underneath the callus, there is epithelialization. Minimal slough on the surface. He has been approved for Epicord, so we will apply that today. He seems to be getting adequate offloading using the peg assist insert in his shoe. 06/30/2022: The wound is smaller today. There is no undermining. The wound surface looks healthier. Still with some periwound callus accumulation. 07/07/2022: The wound is about the same size. The center of the wound is fairly soft and mushy, however. 07/21/2022: He was out of town last week and apparently did quite a bit more walking than he had anticipated. As a result, opening the wound is smaller but he has developed significant undermining, roughly 1.5 cm nearly circumferentially. There is no malodor or purulent drainage. There is heavy callus  accumulation around the wound. 07/28/2022: There has been further breakdown and undermining that has occurred. The muscle is now exposed but there does not appear to be any evidence of necrosis. No malodor or purulent drainage. 08/04/2022: The culture that I took last week grew out a polymicrobial population and doxycycline was the recommended antibiotic for coverage. This was prescribed and he began taking it on Thursday. No real change to his wound. 08/11/22: Measured smaller, still with circumferential undermining. Patient History Information obtained from Patient. Social History Never smoker, Marital Status - Married, Alcohol Use - Never, Drug Use -  No History, Caffeine Use - Daily - T coffee. ea; Medical History Endocrine Patient has history of Type II Diabetes Hospitalization/Surgery History - Amuptation of 3rd,4th and 5th toes of Right foot;Oral Surgery;Anal Fissure surgery; Cholecystectomy. Objective Constitutional Hypertensive, asymptomatic. no acute distress. Vitals Time Taken: 10:07 AM, Height: 72 in, Weight: 312 lbs, BMI: 42.3, Temperature: 98.2 F, Pulse: 80 bpm, Respiratory Rate: 16 breaths/min, Blood Pressure: 158/89 mmHg, Capillary Blood Glucose: 199 mg/dl. Respiratory Normal work of breathing on room air. General Notes: 08/11/22: Undermining worse toward lateral foot 2.4 cm, otherwise very clean. Integumentary (Hair, Skin) Wound #4 status is Open. Original cause of wound was Blister. The date acquired was: 05/06/2022. The wound has been in treatment 13 weeks. The wound is located on the Right,Distal,Lateral Foot. The wound measures 0.9cm length x 1.2cm width x 0.7cm depth; 0.848cm^2 area and 0.594cm^3 volume. There is muscle and Fat Layer (Subcutaneous Tissue) exposed. There is no tunneling noted, however, there is undermining starting at 12:00 and ending at 12:00 with a maximum distance of 2.4cm. There is a medium amount of serosanguineous drainage noted. The wound margin is  well defined and not attached to the wound base. There is large (67-100%) red granulation within the wound bed. There is a small (1-33%) amount of necrotic tissue within the wound bed including Adherent Slough. The periwound skin appearance had no abnormalities noted for color. The periwound skin appearance exhibited: Callus. The periwound skin appearance did not exhibit: Dry/Scaly, Maceration. Periwound temperature was noted as No Abnormality. 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 There was a T Contact otal Cast Procedure by Duanne Guess, MD. Post procedure Diagnosis Wound #4: Same as Pre-Procedure Notes: Scribed for Dr. Lady Gary by [name], RN. Plan Follow-up Appointments: Return Appointment in 1 week. - Dr. Lady Gary - room 2 Anesthetic: (In clinic) Topical Lidocaine 4% applied to wound bed Bathing/ Shower/ Hygiene: May shower with protection but do not get wound dressing(s) wet. Protect dressing(s) with water repellant cover (for example, large plastic bag) or a cast cover and may then take shower. Off-Loading: T Contact Cast to Right Lower Extremity otal Removable cast walker boot to: Additional Orders / Instructions: Follow Nutritious Diet - incorporate protein shakes Other: - vitamin C 500 mg 3 times a day and zinc 30-50 mg a day Juven Shake 1-2 times daily. The following medication(s) was prescribed: lidocaine topical 4 % cream cream topical was prescribed at facility WOUND #4:  - Foot Wound Laterality: Right, Lateral, Distal Cleanser: Soap and Water 1 x Per Day/30 Days Discharge Instructions: May shower and wash wound with dial antibacterial soap and water prior to dressing change. Cleanser: Wound Cleanser (Generic) 1 x Per Day/30 Days Discharge Instructions: Cleanse the wound with wound cleanser prior to applying a clean dressing using gauze sponges, not tissue or cotton balls. Topical: Gentamicin 1 x Per Day/30 Days Discharge Instructions: As directed by physician Topical: Mupirocin Ointment 1 x Per Day/30 Days Discharge Instructions: Apply Mupirocin (Bactroban) as instructed Prim Dressing: Hydrofera Blue Ready Transfer Foam, 2.5x2.5 (in/in) (Generic) 1 x Per Day/30 Days ary Discharge Instructions: Apply directly to wound bed  as directed Secondary Dressing: Woven Gauze Sponge, Non-Sterile 4x4 in 1 x Per Day/30 Days Discharge Instructions: Apply over primary dressing as directed. Secured With: Paper T ape, 2x10 (in/yd) (Generic) 1 x Per Day/30 Days Discharge Instructions: Secure dressing with tape as directed. Com pression Wrap: Kerlix Roll 4.5x3.1 (in/yd) (Generic) 1 x Per Day/30 Days Discharge Instructions: Apply Kerlix and Coban compression as directed. DFU with limited response to current treatment. 1. Zero debridement required today. 2. Mix topical gent/ mupirocin applied, wound paked with HFB ready. 3. TCC applied. 4. Follow up one week. Electronic Signature(s) Signed: 08/18/2022 9:56:45 AM By: Duanne Guess MD FACS Previous Signature: 08/15/2022 4:29:54 PM Version By: Thayer Dallas Entered By: Duanne Guess on 08/18/2022 09:56:45 [name], [name] ([id number]) [id number] Page 10 of 11 -------------------------------------------------------------------------------- HxROS Details Patient Name: Date of Service: [name], [name] 08/11/2022 10:00 [name] Medical Record Number: [id number] Patient Account Number: [id number] Date  of Birth/Sex: Treating RN: 29-Mar-1970 (52 y.o. [name] Primary Care Provider: Tana Conch Other Clinician: Referring Provider: Treating Provider/Extender: Arvin Collard in Treatment: 52 Information Obtained From Patient Endocrine Medical History: Positive for: Type II Diabetes Immunizations Pneumococcal Vaccine: Received Pneumococcal Vaccination: Yes Received Pneumococcal Vaccination On or After 60th Birthday: No Implantable Devices None Hospitalization / Surgery History Type of Hospitalization/Surgery Amuptation of 3rd,4th and 5th toes of Right foot;Oral Surgery;Anal Fissure surgery; Cholecystectomy Family and Social History Never smoker; Marital Status - Married; Alcohol Use: Never; Drug Use: No History; Caffeine Use: Daily - T coffee; Financial Concerns: No; Food, ea; Clothing or Shelter Needs: No; Support System Lacking: No; Transportation Concerns: No Electronic Signature(s) Signed: 08/18/2022 10:26:58 AM By: Duanne Guess MD FACS Entered By: Duanne Guess on 08/18/2022 09:54:55 -------------------------------------------------------------------------------- Total Contact Cast Details Patient Name: Date of Service: [name], [name] 08/11/2022 10:00 [name] Medical Record Number: [id number] Patient Account Number: [id number] Date of Birth/Sex: Treating RN: [birth year] (52 y.o. [name] Primary Care Provider: Tana Conch Other Clinician: Referring Provider: Treating Provider/Extender: Arvin Collard in Treatment: 52 T Contact Cast Applied for Wound Assessment: otal Wound #4 Right,Distal,Lateral Foot Performed By: Physician Duanne Guess, MD Post Procedure Diagnosis Same as Pre-procedure Notes Scribed for Dr. Lady Gary by [name], RN Electronic Signature(s) Signed: 08/18/2022 9:56:25 AM By: Duanne Guess MD FACS Previous Signature: 08/11/2022 11:13:49 AM Version By: Duanne Guess MD FACS Previous Signature:  08/11/2022 4:45:25 PM Version By: [name] By: Duanne Guess on 08/18/2022 09:56:25 [name], [name] ([id number]) [id number] Page 11 of 11 -------------------------------------------------------------------------------- SuperBill Details Patient Name: Date of Service: [name], [name] 08/11/2022 Medical Record Number: [id number] Patient Account Number: [id number] Date of Birth/Sex: Treating RN: 04/18/1970 (52 y.o. 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 E11.49 Type 2 diabetes mellitus with other diabetic neurological complication M86.671 Other chronic osteomyelitis, right ankle and foot E11.621 Type 2 diabetes mellitus with foot ulcer Facility Procedures : CPT4 Code: [id number] Description: 66063 - APPLY TOTAL CONTACT LEG CAST ICD-10 Diagnosis Description 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 w Modifier: ithout evidence of n Quantity: 1 ecrosis Physician Procedures : CPT4 Code Description Modifier [id number] - WC PHYS LEVEL 4 - EST PT ICD-10 Diagnosis Description L97.515 Non-pressure chronic ulcer of other part of  right foot with muscle involvement without evidence of ne E11.49 Type 2 diabetes mellitus with  other diabetic neurological complication M86.671 Other chronic osteomyelitis, right ankle and foot E11.621 Type 2 diabetes mellitus with foot ulcer Quantity: 1 crosis : [id number] - WC PHYS APPLY TOTAL CONTACT CAST ICD-10 Diagnosis Description 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

## 2022-08-12 ENCOUNTER — Telehealth: Payer: Self-pay

## 2022-08-12 NOTE — Telephone Encounter (Signed)
7/22 patient spouse called asking if she can pu CFO's on the 29th for patient as he was just casted for wound I explained we need to fit patient when cast comes off. She asked that we cancel appointment at this time and will cb when cast is off to reschedule - Addison Bailey Cped, CFo, CFm

## 2022-08-18 ENCOUNTER — Encounter (HOSPITAL_BASED_OUTPATIENT_CLINIC_OR_DEPARTMENT_OTHER): Payer: BLUE CROSS/BLUE SHIELD | Admitting: General Surgery

## 2022-08-18 ENCOUNTER — Other Ambulatory Visit: Payer: BLUE CROSS/BLUE SHIELD

## 2022-08-18 DIAGNOSIS — E11621 Type 2 diabetes mellitus with foot ulcer: Secondary | ICD-10-CM | POA: Diagnosis not present

## 2022-08-18 NOTE — Progress Notes (Signed)
[name] ([id number]) [id number] Page 1 of 7 Visit Report for 08/18/2022 Arrival Information Details Patient Name: Date of Service: [name], [name] 08/18/2022 10:45 A M Medical Record Number: [id number] Patient Account Number: [id number] Date of Birth/Sex: Treating RN: 01-24-1970 (52 y.o. [name] Primary Care Zoiey Christy: Tana Conch Other Clinician: Referring Veida Spira: Treating Tephanie Escorcia/Extender: Arvin Collard in Treatment: 35 Visit Information History Since Last Visit Added or deleted any medications: No Patient Arrived: Ambulatory Any new allergies or adverse reactions: No Arrival Time: 10:29 Had a fall or experienced change in No Accompanied By: self activities of daily living that may affect Transfer Assistance: None risk of [name]: Patient Identification Verified: Yes Signs or symptoms of abuse/neglect since last visito No Secondary Verification Process Completed: Yes Hospitalized since last visit: No Patient Requires Transmission-Based Precautions: No Implantable device outside of the clinic excluding No Patient Has Alerts: Yes cellular tissue based products placed in the center since last visit: Has Dressing in Place as Prescribed: Yes Has Footwear/Offloading in Place as Prescribed: Yes Right: T Contact Cast otal Pain Present Now: No Electronic Signature(s) Signed: 08/18/2022 12:33:41 PM By: [name] Entered By: [name] on 08/18/2022 10:30:06 -------------------------------------------------------------------------------- Encounter Discharge Information Details Patient Name: Date of Service: [name], [name] 08/18/2022 10:45 A M Medical Record Number: [id number] Patient Account Number: [id number] Date of Birth/Sex: Treating RN: Apr 14, 1970 (52 y.o. [name] Primary Care Hortence Charter: Tana Conch Other Clinician: Referring Haylin Camilli: Treating Jaskirat Zertuche/Extender:  Arvin Collard in Treatment: 45 Encounter Discharge Information Items Discharge Condition: Stable Ambulatory Status: Ambulatory Discharge Destination: Home Transportation: Private Auto Accompanied By: self Schedule Follow-up Appointment: Yes Clinical Summary of Care: Patient Declined Electronic Signature(s) Signed: 08/18/2022 12:33:41 PM By: [name] Entered By: [name] on 08/18/2022 11:18:52 [name] ([id number]) [id number] Page 2 of 7 -------------------------------------------------------------------------------- Lower Extremity Assessment Details Patient Name: Date of Service: [name], [name] 08/18/2022 10:45 A M Medical Record Number: [id number] Patient Account Number: [id number] Date of Birth/Sex: Treating RN: 03/02/1970 (52 y.o. [name] Primary Care Tiann Saha: Tana Conch Other Clinician: Referring Kendahl Bumgardner: Treating Evan Mackie/Extender: Arvin Collard in Treatment: 58 Edema Assessment Assessed: Kyra Searles: No] Franne Forts: No] Edema: [Left: N] [Right: o] Calf Left: Right: Point of Measurement: From Medial Instep 40.2 cm Ankle Left: Right: Point of Measurement: From Medial Instep 24.4 cm Vascular Assessment Pulses: Dorsalis Pedis Palpable: [Right:Yes] Extremity colors, hair growth, and conditions: Extremity Color: [Right:Hyperpigmented] Hair Growth on Extremity: [Right:No] Temperature of Extremity: [Right:Warm < 3 seconds] Electronic Signature(s) Signed: 08/18/2022 12:33:41 PM By: [name] Entered By: [name] on 08/18/2022 10:41:28 -------------------------------------------------------------------------------- Multi Wound Chart Details Patient Name: Date of Service: [name], [name] 08/18/2022 10:45 A M Medical Record Number: [id number] Patient Account Number: [id number] Date of Birth/Sex: Treating RN: 20-Dec-1970 (52 y.o. M) Primary  Care Jonell Brumbaugh: Tana Conch Other Clinician: Referring Joletta Manner: Treating Fayne Mcguffee/Extender: Arvin Collard in Treatment: 58 Vital Signs Height(in): 72 Capillary Blood Glucose(mg/dl): 831 Weight(lbs): 517 Pulse(bpm): 71 Body Mass Index(BMI): 42.3 Blood Pressure(mmHg): 167/86 Temperature(F): 98.8 Respiratory Rate(breaths/min): 18 [4:Photos:] [N/A:N/A] Right, Distal, Lateral Foot N/A N/A Wound Location: Blister N/A N/A Wounding Event: Diabetic Wound/Ulcer of the Lower N/A N/A Primary Etiology: Extremity Type II Diabetes N/A N/A Comorbid History: 05/06/2022 N/A N/A Date Acquired: 14 N/A N/A Weeks of Treatment: Open N/A N/A Wound Status: No N/A N/A Wound Recurrence: 0.8x1.2x0.8 N/A N/A Measurements L x W x D (cm) 0.754 N/A N/A  A (cm) : rea 0.603 N/A N/A Volume (cm) : 52.00% N/A N/A % Reduction in A rea: -284.10% N/A N/A % Reduction in Volume: 12 Starting Position 1 (o'clock): 12 Ending Position 1 (o'clock): 2.3 Maximum Distance 1 (cm): Yes N/A N/A Undermining: Grade 2 N/A N/A Classification: Medium N/A N/A Exudate A mount: Serosanguineous N/A N/A Exudate Type: red, brown N/A N/A Exudate Color: Well defined, not attached N/A N/A Wound Margin: Large (67-100%) N/A N/A Granulation A mount: Red N/A N/A Granulation Quality: Small (1-33%) N/A N/A Necrotic A mount: Fat Layer (Subcutaneous Tissue): Yes N/A N/A Exposed Structures: Muscle: Yes Fascia: No Tendon: No Joint: No Bone: No Small (1-33%) N/A N/A Epithelialization: Callus: Yes N/A N/A Periwound Skin Texture: Maceration: Yes N/A N/A Periwound Skin Moisture: Dry/Scaly: No No Abnormalities Noted N/A N/A Periwound Skin Color: No Abnormality N/A N/A Temperature: T Contact Cast otal N/A N/A Procedures Performed: Treatment Notes Electronic Signature(s) Signed: 08/18/2022 10:53:10 AM By: Duanne Guess MD FACS Entered By: Duanne Guess on 08/18/2022  10:53:09 -------------------------------------------------------------------------------- Multi-Disciplinary Care Plan Details Patient Name: Date of Service: [name], [name] 08/18/2022 10:45 A M Medical Record Number: [id number] Patient Account Number: [id number] Date of Birth/Sex: Treating RN: [birth year] (52 y.o. [name] Primary Care Krystine Pabst: Tana Conch Other Clinician: Referring Keyanna Sandefer: Treating Sharda Keddy/Extender: Arvin Collard in Treatment: 58 Multidisciplinary Care Plan reviewed with physician Active Inactive Nutrition Nursing Diagnoses: Impaired glucose control: actual or potential Potential for alteratiion in Nutrition/Potential for imbalanced nutrition Goals: [name], Alfonzo M ([id number]) [id number] Page 4 of 7 Patient/caregiver will maintain therapeutic glucose control Date Initiated: 07/29/2021 Target Resolution Date: 09/19/2022 Goal Status: Active Interventions: Assess patient nutrition upon admission and as needed per policy Provide education on elevated blood sugars and impact on wound healing Treatment Activities: Patient referred to Primary Care Physician for further nutritional evaluation : 07/29/2021 Notes: Wound/Skin Impairment Nursing Diagnoses: Impaired tissue integrity Goals: Patient/caregiver will verbalize understanding of skin care regimen Date Initiated: 07/04/2021 Target Resolution Date: 09/19/2022 Goal Status: Active Interventions: Assess ulceration(s) every visit Treatment Activities: Skin care regimen initiated : 07/04/2021 Notes: Electronic Signature(s) Signed: 08/18/2022 12:33:41 PM By: [name] Entered By: [name] on 08/18/2022 10:50:55 -------------------------------------------------------------------------------- Pain Assessment Details Patient Name: Date of Service: [name], [name] 08/18/2022 10:45 A M Medical Record Number: [id number] Patient  Account Number: [id number] Date of Birth/Sex: Treating RN: 08-Mar-1970 (52 y.o. [name] Primary Care Mclane Arora: Tana Conch Other Clinician: Referring Lasha Echeverria: Treating Alania Overholt/Extender: Arvin Collard in Treatment: 44 Active Problems Location of Pain Severity and Description of Pain Patient Has Paino No Site Locations Rate the pain. Current Pain Level: 0 Pain Management and Medication Current Pain Management: [name], Tiny M ([id number]) [id number] Page 5 of 7 Electronic Signature(s) Signed: 08/18/2022 12:33:41 PM By: [name] By: [name] on 08/18/2022 10:31:39 -------------------------------------------------------------------------------- Patient/Caregiver Education Details Patient Name: Date of Service: Oletta Darter, [name] 7/29/2024andnbsp10:45 A M Medical Record Number: [id number] Patient Account Number: [id number] Date of Birth/Gender: Treating RN: January 14, 1971 (52 y.o. [name] Primary Care Physician: Tana Conch Other Clinician: Referring Physician: Treating Physician/Extender: Arvin Collard in Treatment: 51 Education Assessment Education Provided To: Patient Education Topics Provided Wound/Skin Impairment: Methods: Explain/Verbal Responses: Reinforcements needed, State content correctly Electronic Signature(s) Signed: 08/18/2022 12:33:41 PM By: [name] Entered By: [name] on 08/18/2022 11:18:09 -------------------------------------------------------------------------------- Wound Assessment Details Patient Name: Date of Service: [name], [name] 08/18/2022 10:45 A M Medical Record Number: [id number]  Patient Account Number: [id number] Date of Birth/Sex: Treating RN: 05/15/1970 (52 y.o. [name] Primary Care Alishia Lebo: Tana Conch Other Clinician: Referring Cayton Cuevas: Treating Pate Aylward/Extender:  Arvin Collard in Treatment: 58 Wound Status Wound Number: 4 Primary Etiology: Diabetic Wound/Ulcer of the Lower Extremity Wound Location: Right, Distal, Lateral Foot Wound Status: Open Wounding Event: Blister Comorbid History: Type II Diabetes Date Acquired: 05/06/2022 Weeks Of Treatment: 14 Clustered Wound: No Photos [name], Kass Mars ([id number]) [id number] Page 6 of 7 Wound Measurements Length: (cm) 0.8 Width: (cm) 1.2 Depth: (cm) 0.8 Area: (cm) 0.754 Volume: (cm) 0.603 % Reduction in Area: 52% % Reduction in Volume: -284.1% Epithelialization: Small (1-33%) Tunneling: No Undermining: Yes Starting Position (o'clock): 12 Ending Position (o'clock): 12 Maximum Distance: (cm) 2.3 Wound Description Classification: Grade 2 Wound Margin: Well defined, not attached Exudate Amount: Medium Exudate Type: Serosanguineous Exudate Color: red, brown Foul Odor After Cleansing: No Slough/Fibrino Yes Wound Bed Granulation Amount: Large (67-100%) Exposed Structure Granulation Quality: Red Fascia Exposed: No Necrotic Amount: Small (1-33%) Fat Layer (Subcutaneous Tissue) Exposed: Yes Necrotic Quality: Adherent Slough Tendon Exposed: No Muscle Exposed: Yes Necrosis of Muscle: No Joint Exposed: No Bone Exposed: No Periwound Skin Texture Texture Color No Abnormalities Noted: No No Abnormalities Noted: Yes Callus: Yes Temperature / Pain Temperature: No Abnormality Moisture No Abnormalities Noted: No Dry / Scaly: No Maceration: Yes Treatment Notes Wound #4 (Foot) Wound Laterality: Right, Lateral, Distal Cleanser Soap and Water Discharge Instruction: May shower and wash wound with dial antibacterial soap and water prior to dressing change. Wound Cleanser Discharge Instruction: Cleanse the wound with wound cleanser prior to applying a clean dressing using gauze sponges, not tissue or cotton balls. Peri-Wound  Care Topical Gentamicin Discharge Instruction: As directed by physician Mupirocin Ointment Discharge Instruction: Apply Mupirocin (Bactroban) as instructed Primary Dressing Hydrofera Blue Ready Transfer Foam, 2.5x2.5 (in/in) Discharge Instruction: Apply directly to wound bed as directed [address] [name], Deyonte M ([id number]) [id number] Page 7 of 7 Woven Gauze Sponge, Non-Sterile 4x4 in Discharge Instruction: Apply over primary dressing as directed. Secured With Paper Tape, 2x10 (in/yd) Discharge Instruction: Secure dressing with tape as directed. Compression Wrap Kerlix Roll 4.5x3.1 (in/yd) Discharge Instruction: Apply Kerlix and Coban compression as directed. Compression Stockings Add-Ons Electronic Signature(s) Signed: 08/18/2022 12:33:41 PM By: [name] Entered By: [name] on 08/18/2022 10:50:03 -------------------------------------------------------------------------------- Vitals Details Patient Name: Date of Service: [name], [name] 08/18/2022 10:45 A M Medical Record Number: [id number] Patient Account Number: [id number] Date of Birth/Sex: Treating RN: September 25, 1970 (52 y.o. [name] Primary Care Avyukth Bontempo: Tana Conch Other Clinician: Referring Steel Kerney: Treating Ameri Cahoon/Extender: Arvin Collard in Treatment: 58 Vital Signs Time Taken: 10:30 Temperature (F): 98.8 Height (in): 72 Pulse (bpm): 71 Weight (lbs): 312 Respiratory Rate (breaths/min): 18 Body Mass Index (BMI): 42.3 Blood Pressure (mmHg): 167/86 Capillary Blood Glucose (mg/dl): 086 Reference Range: 80 - 120 mg / dl Electronic Signature(s) Signed: 08/18/2022 12:33:41 PM By: [name] Entered By: [name] on 08/18/2022 10:32:15

## 2022-08-18 NOTE — Progress Notes (Signed)
[name], [name] ([id number]) [id number] Page 1 of 11 Visit Report for 08/18/2022 Chief Complaint Document Details Patient Name: Date of Service: [name], [name] 08/18/2022 10:45 [name] Medical Record Number: [id number] Patient Account Number: [id number] Date of Birth/Sex: Treating RN: Jun 18, 1970 (52 y.o. [name] Primary Care Provider: Tana Conch Other Clinician: Referring Provider: Treating Provider/Extender: Arvin Collard in Treatment: 52 Information Obtained from: Patient Chief Complaint Patients presents for treatment of an open diabetic ulcer Electronic Signature(s) Signed: 08/18/2022 10:53:26 AM By: Duanne Guess MD FACS Entered By: Duanne [name] on 08/18/2022 10:53:26 -------------------------------------------------------------------------------- HPI Details Patient Name: Date of Service: [name], [name] 08/18/2022 10:45 [name] Medical Record Number: [id number] Patient Account Number: [id number] Date of Birth/Sex: Treating RN: May 09, 1970 (52 y.o. 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 The most recent amputation was in August 2022. At that operation, antibiotic beads were placed in the wound. He has been managed by podiatry for his procedures and management of his wounds. He has been in a Scientist, water quality. He is on oral doxycycline. They have been using Betadine wet to dry dressings along with Iodosorb. The patient states that when he thinks the wound is getting too dry, he applies topical Neosporin. At his last visit, on June 7 of this year, the podiatrist  determined that he felt the wound was stalled and referred him to wound care for additional evaluation and management. An MRI has been ordered, but not yet scheduled or performed. Pathology from his operation in August 2022 demonstrated findings consistent with chronic osteomyelitis. Today, there is a large irregular wound on the plantar surface of his right foot, at about the level of the fifth metatarsal head. This tracks through to a pinpoint opening on the dorsal lateral portion of his foot. The intake nurse reported purulent drainage. There is some malodor from the wound. No frank necrosis identified. 07/11/2021: Today, the wounds do not connect. I attempted multiple times from various directions and the shared tunnel is no longer open. He has some slough accumulation on the dorsal part of his foot as well as slough and callus buildup on the plantar surface. His MRI is scheduled for June 29. No purulent drainage or malodor appreciated today. 07/19/2021: The lateral foot wound has closed and there is no tunnel connecting the plantar foot wound to that site. The plantar foot wound still probes quite deeply, however. There is some slough, eschar, and nonviable tissue accumulated in the wound bed. No malodorous drainage present. His MRI was performed yesterday and is consistent with fairly extensive osteomyelitis. 07/29/2021: The patient has an appointment with infectious disease on July 18 to treat his osteomyelitis. The plantar wound still probes quite deeply, approaching bone. The orifice has narrowed quite substantially, however, making it more difficult for him to pack. 08/05/2021: The tunnel connecting the lateral foot wound to the plantar foot wound has reopened. He sees infectious disease tomorrow to discuss long-term antibiotic treatment for osteomyelitis. There was a bit of murky drainage in the wound, but this was noted after he had had topical lidocaine applied so may have just been a blob  of the anesthetic. No odor or frank purulent drainage.  The wound probes deeply at the midfoot approaching bone. He does have MRI results confirming his diagnosis of osteomyelitis. He has failed to progress with conventional treatment and I think his best chance for preservation of the foot is to initiate hyperbaric oxygen therapy. [name], [name] ([id number]) [id number] Page 2 of 11 08/13/2021: The tunnel connecting the 2 wounds has closed again. He has a PICC line and is getting IV daptomycin and cefepime. EKG and chest x-ray are within normal limits. He is scheduled to start hyperbaric oxygen therapy tomorrow. The wound in his midfoot probes deeply, approaching bone. The skin at the orifice continues to heaped up and threatens to close over despite the large cavity within. No erythema, induration, or purulent drainage. The wound on his lateral foot is fairly small and quite clean. 08/19/2021: The lateral foot wound has nearly closed. The wound in his midfoot does not probe quite as deeply today. The skin at the orifice continues to try to roll in and obscure the opening. No frankly necrotic tissue appreciated. He is tolerating hyperbaric oxygen therapy. 08/26/2021: The lateral foot wound has closed completely. The wound in his midfoot is shallower again today. The wound orifice is contracting. We are using gentamicin and silver alginate. He continues on IV daptomycin and cefepime and is tolerating hyperbaric oxygen therapy without difficulty. 09/02/2021: His foot is a little bit sore today but he was up walking on it all weekend doing back to school shopping with his daughter. The tunneling is down to about 3 cm and I do not appreciate bone at the tip of the probe. The wound is clean but has some callus creating an overhanging lip at the distal aspect. He continues to receive hyperbaric oxygen therapy as well as IV daptomycin and cefepime. 09/09/2021: His wound continues to  improve. The tunnel has come in by 0.9 cm. He continues to form callus around the orifice of the wound. He is tolerating hyperbaric oxygen therapy along with his IV antibiotics. 09/16/2021: The tunneling is down to just half a centimeter. He continues to build up callus around the orifice of the wound. He completed his course of IV antibiotics and his PICC line is scheduled to be removed this Friday. He is tolerating hyperbaric oxygen without difficulty. We have been applying topical gentamicin and silver alginate to his wound. 09/24/2021: The wound depth has come in even further. There is a little bit of callus buildup around the orifice. The opening is quite narrow, at this point. 09/30/2021: The wound depth is about the same this week. The opening continues to contract with callus accumulation. There is some discoloration of the skin on the lateral part of his foot and he admitted to walking more than usual this weekend and also wearing a different pair shoes. He continues to tolerate hyperbaric oxygen therapy without difficulty. 10/07/2021: The wound depth has contracted a bit and is now approximately 1 to 1.5 cm. The orifice of the wound continues to try and close in over the space. There is just some callus and skin around the margins obscuring the orifice. 10/14/2021: The wound depth has come in a little bit more. The orifice of the wound is rolling inwards with epithelium and callus, making it difficult to pack the wound. 10/21/2021: There is still 1 portion of the wound, at the lateral aspect, that is still a couple of centimeters deep. The architecture of the wound makes this somewhat challenging to access and pack. Everything else looks clean. He has his  usual accumulation of callus and skin, narrowing the orifice. 10/29/2021: No significant change to the depth of the wound. The orifice continues to contract with callus and skin narrowing the opening. His wife has been packing the wound and doing  an excellent job. 11/05/2021: The depth of the wound has come in by about a centimeter. There is less callus accumulation around the orificedespite this, the wound is narrower today. We are still awaiting insurance approval for Kerecis powder. 11/12/2021: The depth of the wound continues to contract. He has accumulated some callus around the orifice, as usual. We have received a verbal confirmation that he is approved for Kerecis, but no formal paperwork has yet been received. 11/19/2021: The depth continues to come in. There is callus accumulation around the orifice, as usual. He has been formally approved for Kerecis, but unfortunately we do not have a billing mechanism for the powder in [employer] yet. We are working to address this so we can begin treatment. 11/25/2021: Continued filling in of the depth of the wound. Continued buildup of callus around the orifice. No concern for infection. As we do not have a way to bill for Kerecis powder, but are capable of billing for the [facility] sheet, we are going to use that on him instead. 12/03/2021: The depth of the wound continues to fill in. As usual, he has accumulated callus and skin around the orifice of the wound. No concern for infection. He will complete his hyperbaric oxygen therapy this week. 12/17/2021: The wound depth continues to contract. There is callus and skin accumulation around the orifice of the wound, as per usual. There is more epithelium on the actual wound surface. Unfortunately, his Darlen Round was not ordered. 12/24/2021: The wound depth has come in a little bit more this week. There is less skin accumulation around the orifice, but still some callus. On questioning, he admits to spending a fair amount of time on his feet. 12/12; this is a very difficult wound to assess size slitlike wound with some depth but minimal opening. I applied Kerecis No. 4 12/18; Kerecis No. 5 01/15/2022: His wound measured deeper today. There is also some  evidence of pressure induced tissue injury around the lateral aspect of his foot. The patient reports that he was wearing different shoes than usual and felt like his foot was rolling a lot over the weekend. There is heavy callus around the orifice of the wound. No concern for infection. 01/21/2022: The depth decreased today from 1.7 to 1.0 cm. Significantly less tissue damage as compared to last week. Still with callus and skin accumulation around the orifice. 01/29/2022: The wound continues to contract and the depth is coming in further. We are still working on getting [employer] for him. 02/04/2022: No real change this week. Still working on the [employer]. 02/11/2022: No significant changes again. We are still working on the [employer]. 02/18/2022: No change to the wound. We have submitted in IVR for Epic[employer]. 02/25/2022: Unfortunately, his wound has deteriorated over the past week. It is deeper and is approaching bone once again. There is more slough accumulation and the tissue shows signs of pressure injury. He says that he has been trying to wear his new work boots, which are waterproof. He says they make his feet sweat excessively. I think our main challenge with him is that we just are not able to adequately offload him while he is still working and on his feet through much of the day. He is willing  to consider total contact casting, but will need to make some arrangements with work. On a more positive note, however, he has been approved for Epicord. 03/03/2022: The wound probed a little bit deeper again today. We are planning to put him in a total contact cast as soon as he has cleared it with work to take some time off. 03/10/2022: The wound was a little bit shallower today and the overall appearance is significantly improved. We are planning to put him in a cast today. 03/12/2022: He is here for his first total contact cast change. He denies any issues with pressure, rubbing, or  pain. [name], [name] ([id number]) [id number] Page 3 of 11 03/17/2022: For some reason, the wound probes deeper today. There is some slough and nonviable subcutaneous tissue around the opening. He denies any difficulty with the total contact cast. 03/24/2022: The wound is shallower today. There is better granulation tissue visible on the wound surface. 03/31/2022: His cast protector got a hole in it and his cast got wet. His foot is macerated. The wound measured a little deeper today. There is some hypertrophic granulation tissue at the orifice of his wound. 3/18; patient presents for follow-up. He had no issues with the total contact cast. We have been using epi cord to the wound bed. Overall wound is stable. Patient denies signs of infection. 04/14/2022: The depth of the wound has rather remarkably decreased to 1.3 cm today. The orifice of the wound has also contracted quite a bit. 04/21/2022: The depth of the wound has decreased even further. The orifice is also smaller. The wound is very clean. 04/28/2022: The depth of the wound is down to 0.5 cm. The orifice continues to contract. 05/06/2022: The primary wound is substantially smaller and shallower today. Unfortunately, he developed a blister distal to this on the plantar surface of his foot. The tissue breakdown underneath the blister does extend into the fat layer in 1 area over a bony prominence from his collapsed arch. The remainder of the tissue underneath the blister is intact skin. 05/12/2022: The primary wound is down to just a couple of millimeters and is flush with the surrounding skin. The area that had blistered last week had some purulent drainage coming follow-up a corner that was underneath some dry skin. No malodor associated with this. There is some additional slough that has accumulated. 05/19/2022: His primary wound has actually healed. The area that opened after a blister formed is smaller. There is a little  bit of thickened skin around the edges, no concern for infection. 05/26/2022: The wound is stable. Unfortunately, for some reason the primary dressing was left off and there was no silver alginate applied. There was also no zinc oxide applied to the periwound so there is some tissue maceration present due to the fact that the only thing used on the wound was a foam donut, which retains the moisture from his foot. 06/02/2022: The wound measured 2 mm wider today. There is still some blue-green staining on the surface. 06/09/2022: No change to the wound dimensions. The culture that I took last week grew out Pseudomonas aeruginosa and Enterococcus faecalis. Levofloxacin was prescribed and he began taking this last Wednesday. 06/18/2022: He completed his levofloxacin yesterday. Today, the wound has not changed in its dimensions. There is callus accumulation around the wound and some slough on the wound surface. For what ever reason, we do not seem to be getting the offloading affect from the total contact cast that one might expect.  06/23/2022: The wound is a little bit smaller today. There is some undermining created by accumulated callus, but underneath the callus, there is epithelialization. Minimal slough on the surface. He has been approved for Epicord, so we will apply that today. He seems to be getting adequate offloading using the peg assist insert in his shoe. 06/30/2022: The wound is smaller today. There is no undermining. The wound surface looks healthier. Still with some periwound callus accumulation. 07/07/2022: The wound is about the same size. The center of the wound is fairly soft and mushy, however. 07/21/2022: He was out of town last week and apparently did quite a bit more walking than he had anticipated. As a result, opening the wound is smaller but he has developed significant undermining, roughly 1.5 cm nearly circumferentially. There is no malodor or purulent drainage. There is heavy callus  accumulation around the wound. 07/28/2022: There has been further breakdown and undermining that has occurred. The muscle is now exposed but there does not appear to be any evidence of necrosis. No malodor or purulent drainage. 08/04/2022: The culture that I took last week grew out a polymicrobial population and doxycycline was the recommended antibiotic for coverage. This was prescribed and he began taking it on Thursday. No real change to his wound. 08/11/22: Measured smaller, still with circumferential undermining. 08/18/2022: The wound measured slightly smaller. There is still circumferential undermining, but the area at 12:00 is not as deep as 6-9 o'clock. He had a bit more drainage this week and the periwound is slightly macerated. The drainage appears serosanguineous. Electronic Signature(s) Signed: 08/18/2022 10:54:35 AM By: Duanne Guess MD FACS Entered By: Duanne [name] on 08/18/2022 10:54:35 -------------------------------------------------------------------------------- Physical Exam Details Patient Name: Date of Service: [name], [name] 08/18/2022 10:45 [name] Medical Record Number: [id number] Patient Account Number: [id number] Date of Birth/Sex: Treating RN: [birth year] (52 y.o. [name] Primary Care Provider: Tana Conch Other Clinician: Referring Provider: Treating Provider/Extender: Arvin Collard in Treatment: 58 [name], [name] ([id number]) [phone].pdf Page 4 of 11 Constitutional Hypertensive, asymptomatic. . . . no acute distress. Respiratory Normal work of breathing on room air. Notes 08/18/2022: The wound measured slightly smaller. There is still circumferential undermining, but the area at 12:00 is not as deep as 6-9 o'clock. He had a bit more drainage this week and the periwound is slightly macerated. The drainage appears serosanguineous. Electronic Signature(s) Signed: 08/18/2022 10:55:28 AM By: Duanne Guess MD  FACS Entered By: Duanne [name] on 08/18/2022 10:55:28 -------------------------------------------------------------------------------- Physician Orders Details Patient Name: Date of Service: [name], [name] 08/18/2022 10:45 [name] Medical Record Number: [id number] Patient Account Number: [id number] Date of Birth/Sex: Treating RN: 07-Jun-1970 (52 y.o. 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 - Dr. Lady Gary - room 2 Return A Anesthetic (In clinic) Topical Lidocaine 4% applied to wound bed Bathing/ Shower/ Hygiene May shower with protection but do not get wound dressing(s) wet. Protect dressing(s) with water repellant cover (for example, large plastic bag) or a cast cover and may then take shower. Off-Loading Total Contact Cast to  Right Lower Extremity Removable cast walker boot to: Additional Orders / Instructions Follow Nutritious Diet - incorporate protein shakes Other: - vitamin C 500 mg 3 times a day and zinc 30-50 mg a day Juven Shake 1-2 times daily. Wound Treatment Wound #4 - Foot Wound Laterality: Right, Lateral, Distal Cleanser: Soap and Water 1 x Per Day/30 Days Discharge Instructions: May shower and wash wound with dial antibacterial soap and water prior to dressing change. Cleanser: Wound Cleanser (Generic) 1 x Per Day/30 Days Discharge Instructions: Cleanse the wound with wound cleanser prior to applying a clean  dressing using gauze sponges, not tissue or cotton balls. Topical: Gentamicin 1 x Per Day/30 Days Discharge Instructions: As directed by physician Topical: Mupirocin Ointment 1 x Per Day/30 Days Discharge Instructions: Apply Mupirocin (Bactroban) as instructed Prim Dressing: Hydrofera Blue Ready Transfer Foam, 2.5x2.5 (in/in) (Generic) 1 x Per Day/30 Days ary [name], [name] ([id number]) [id number] Page 5 of 11 Discharge Instructions: Apply directly to wound bed as directed Secondary Dressing: Woven Gauze Sponge, Non-Sterile 4x4 in 1 x Per Day/30 Days Discharge Instructions: Apply over primary dressing as directed. Secured With: Paper Tape, 2x10 (in/yd) (Generic) 1 x Per Day/30 Days Discharge Instructions: Secure dressing with tape as directed. Compression Wrap: Kerlix Roll 4.5x3.1 (in/yd) (Generic) 1 x Per Day/30 Days Discharge Instructions: Apply Kerlix and Coban compression as directed. Patient Medications llergies: No Known Allergies A Notifications Medication Indication Start End 08/18/2022 lidocaine DOSE topical 4 % cream - cream topical Electronic Signature(s) Signed: 08/18/2022 11:34:10 AM By: Duanne Guess MD FACS Entered By: Duanne [name] on 08/18/2022 10:56:05 -------------------------------------------------------------------------------- Problem List Details Patient Name: Date of Service: [name], [name] 08/18/2022 10:45 [name] Medical Record Number: [id number] Patient Account Number: [id number] Date of Birth/Sex: Treating RN: Oct 01, 1970 (52 y.o. 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 05/06/2022 No Yes without evidence of necrosis E11.49 Type 2  diabetes mellitus with other diabetic neurological complication 07/04/2021 No Yes M86.671 Other chronic osteomyelitis, right ankle and foot 07/04/2021 No Yes E11.621 Type 2 diabetes mellitus with foot ulcer 07/04/2021 No Yes Inactive Problems Resolved Problems ICD-10 Code Description Active Date Resolved Date L97.514 Non-pressure chronic ulcer of other part of right foot with necrosis of bone 07/04/2021 07/04/2021 [name], Burnard [name] ([id number]) [id number] Page 6 of 11 Electronic Signature(s) Signed: 08/18/2022 10:52:23 AM By: Duanne Guess MD FACS Entered By: Duanne [name] on 08/18/2022 10:52:23 -------------------------------------------------------------------------------- Progress Note Details Patient Name: Date of Service: [name], [name] 08/18/2022 10:45 [name] Medical Record Number: [id number] Patient Account Number: [id number] Date of Birth/Sex: Treating RN: 1970/03/02 (52 y.o. 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 The most recent amputation was in August 2022. At that operation, antibiotic beads were placed in the wound. He has been managed by podiatry for his procedures and management of his wounds. He has been in a Scientist, water quality. He is on oral doxycycline.  They have been using Betadine wet to dry dressings along with Iodosorb. The patient states that when he thinks the wound is getting too dry, he applies topical Neosporin. At his last visit, on June 7 of this year, the  podiatrist determined that he felt the wound was stalled and referred him to wound care for additional evaluation and management. An MRI has been ordered, but not yet scheduled or performed. Pathology from his operation in August 2022 demonstrated findings consistent with chronic osteomyelitis. Today, there is a large irregular wound on the plantar surface of his right foot, at about the level of the fifth metatarsal head. This tracks through to a pinpoint opening on the dorsal lateral portion of his foot. The intake nurse reported purulent drainage. There is some malodor from the wound. No frank necrosis identified. 07/11/2021: Today, the wounds do not connect. I attempted multiple times from various directions and the shared tunnel is no longer open. He has some slough accumulation on the dorsal part of his foot as well as slough and callus buildup on the plantar surface. His MRI is scheduled for June 29. No purulent drainage or malodor appreciated today. 07/19/2021: The lateral foot wound has closed and there is no tunnel connecting the plantar foot wound to that site. The plantar foot wound still probes quite deeply, however. There is some slough, eschar, and nonviable tissue accumulated in the wound bed. No malodorous drainage present. His MRI was performed yesterday and is consistent with fairly extensive osteomyelitis. 07/29/2021: The patient has an appointment with infectious disease on July 18 to treat his osteomyelitis. The plantar wound still probes quite deeply, approaching bone. The orifice has narrowed quite substantially, however, making it more difficult for him to pack. 08/05/2021: The tunnel connecting the lateral foot wound to the plantar foot wound has reopened. He sees infectious disease tomorrow to discuss long-term antibiotic treatment for osteomyelitis. There was a bit of murky drainage in the wound, but this was noted after he had had topical lidocaine applied so may have just  been a blob of the anesthetic. No odor or frank purulent drainage. The wound probes deeply at the midfoot approaching bone. He does have MRI results confirming his diagnosis of osteomyelitis. He has failed to progress with conventional treatment and I think his best chance for preservation of the foot is to initiate hyperbaric oxygen therapy. 08/13/2021: The tunnel connecting the 2 wounds has closed again. He has a PICC line and is getting IV daptomycin and cefepime. EKG and chest x-ray are within normal limits. He is scheduled to start hyperbaric oxygen therapy tomorrow. The wound in his midfoot probes deeply, approaching bone. The skin at the orifice continues to heaped up and threatens to close over despite the large cavity within. No erythema, induration, or purulent drainage. The wound on his lateral foot is fairly small and quite clean. 08/19/2021: The lateral foot wound has nearly closed. The wound in his midfoot does not probe quite as deeply today. The skin at the orifice continues to try to roll in and obscure the opening. No frankly necrotic tissue appreciated. He is tolerating hyperbaric oxygen therapy. 08/26/2021: The lateral foot wound has closed completely. The wound in his midfoot is shallower again today. The wound orifice is contracting. We are using gentamicin and silver alginate. He continues on IV daptomycin and cefepime and is tolerating hyperbaric oxygen therapy without difficulty. 09/02/2021: His foot is a little bit sore today but he was up walking on it all weekend  doing back to school shopping with his daughter. The tunneling is down to about 3 cm and I do not appreciate bone at the tip of the probe. The wound is clean but has some callus creating an overhanging lip at the distal aspect. He continues to receive hyperbaric oxygen therapy as well as IV daptomycin and cefepime. 09/09/2021: His wound continues to improve. The tunnel has come in by 0.9 cm. He continues to form callus  around the orifice of the wound. He is tolerating hyperbaric oxygen therapy along with his IV antibiotics. 09/16/2021: The tunneling is down to just half a centimeter. He continues to build up callus around the orifice of the wound. He completed his course of IV antibiotics and his PICC line is scheduled to be removed this Friday. He is tolerating hyperbaric oxygen without difficulty. We have been applying topical gentamicin and silver alginate to his wound. 09/24/2021: The wound depth has come in even further. There is a little bit of callus buildup around the orifice. The opening is quite narrow, at this point. [name], [name] ([id number]) [id number] Page 7 of 11 09/30/2021: The wound depth is about the same this week. The opening continues to contract with callus accumulation. There is some discoloration of the skin on the lateral part of his foot and he admitted to walking more than usual this weekend and also wearing a different pair shoes. He continues to tolerate hyperbaric oxygen therapy without difficulty. 10/07/2021: The wound depth has contracted a bit and is now approximately 1 to 1.5 cm. The orifice of the wound continues to try and close in over the space. There is just some callus and skin around the margins obscuring the orifice. 10/14/2021: The wound depth has come in a little bit more. The orifice of the wound is rolling inwards with epithelium and callus, making it difficult to pack the wound. 10/21/2021: There is still 1 portion of the wound, at the lateral aspect, that is still a couple of centimeters deep. The architecture of the wound makes this somewhat challenging to access and pack. Everything else looks clean. He has his usual accumulation of callus and skin, narrowing the orifice. 10/29/2021: No significant change to the depth of the wound. The orifice continues to contract with callus and skin narrowing the opening. His wife has been packing the  wound and doing an excellent job. 11/05/2021: The depth of the wound has come in by about a centimeter. There is less callus accumulation around the orificedespite this, the wound is narrower today. We are still awaiting insurance approval for Kerecis powder. 11/12/2021: The depth of the wound continues to contract. He has accumulated some callus around the orifice, as usual. We have received a verbal confirmation that he is approved for Kerecis, but no formal paperwork has yet been received. 11/19/2021: The depth continues to come in. There is callus accumulation around the orifice, as usual. He has been formally approved for Kerecis, but unfortunately we do not have a billing mechanism for the powder in [facility] yet. We are working to address this so we can begin treatment. 11/25/2021: Continued filling in of the depth of the wound. Continued buildup of callus around the orifice. No concern for infection. As we do not have a way to bill for Kerecis powder, but are capable of billing for the [facility] sheet, we are going to use that on him instead. 12/03/2021: The depth of the wound continues to fill in. As usual, he has accumulated callus and skin  around the orifice of the wound. No concern for infection. He will complete his hyperbaric oxygen therapy this week. 12/17/2021: The wound depth continues to contract. There is callus and skin accumulation around the orifice of the wound, as per usual. There is more epithelium on the actual wound surface. Unfortunately, his Darlen Round was not ordered. 12/24/2021: The wound depth has come in a little bit more this week. There is less skin accumulation around the orifice, but still some callus. On questioning, he admits to spending a fair amount of time on his feet. 12/12; this is a very difficult wound to assess size slitlike wound with some depth but minimal opening. I applied Kerecis No. 4 12/18; Kerecis No. 5 01/15/2022: His wound measured deeper today. There is  also some evidence of pressure induced tissue injury around the lateral aspect of his foot. The patient reports that he was wearing different shoes than usual and felt like his foot was rolling a lot over the weekend. There is heavy callus around the orifice of the wound. No concern for infection. 01/21/2022: The depth decreased today from 1.7 to 1.0 cm. Significantly less tissue damage as compared to last week. Still with callus and skin accumulation around the orifice. 01/29/2022: The wound continues to contract and the depth is coming in further. We are still working on getting [employer] for him. 02/04/2022: No real change this week. Still working on the [employer]. 02/11/2022: No significant changes again. We are still working on the [employer]. 02/18/2022: No change to the wound. We have submitted in IVR for Epic[employer]. 02/25/2022: Unfortunately, his wound has deteriorated over the past week. It is deeper and is approaching bone once again. There is more slough accumulation and the tissue shows signs of pressure injury. He says that he has been trying to wear his new work boots, which are waterproof. He says they make his feet sweat excessively. I think our main challenge with him is that we just are not able to adequately offload him while he is still working and on his feet through much of the day. He is willing to consider total contact casting, but will need to make some arrangements with work. On a more positive note, however, he has been approved for Epicord. 03/03/2022: The wound probed a little bit deeper again today. We are planning to put him in a total contact cast as soon as he has cleared it with work to take some time off. 03/10/2022: The wound was a little bit shallower today and the overall appearance is significantly improved. We are planning to put him in a cast today. 03/12/2022: He is here for his first total contact cast change. He denies any issues with pressure,  rubbing, or pain. 03/17/2022: For some reason, the wound probes deeper today. There is some slough and nonviable subcutaneous tissue around the opening. He denies any difficulty with the total contact cast. 03/24/2022: The wound is shallower today. There is better granulation tissue visible on the wound surface. 03/31/2022: His cast protector got a hole in it and his cast got wet. His foot is macerated. The wound measured a little deeper today. There is some hypertrophic granulation tissue at the orifice of his wound. 3/18; patient presents for follow-up. He had no issues with the total contact cast. We have been using epi cord to the wound bed. Overall wound is stable. Patient denies signs of infection. 04/14/2022: The depth of the wound has rather remarkably decreased to 1.3 cm today.  The orifice of the wound has also contracted quite a bit. 04/21/2022: The depth of the wound has decreased even further. The orifice is also smaller. The wound is very clean. 04/28/2022: The depth of the wound is down to 0.5 cm. The orifice continues to contract. 05/06/2022: The primary wound is substantially smaller and shallower today. Unfortunately, he developed a blister distal to this on the plantar surface of his foot. The tissue breakdown underneath the blister does extend into the fat layer in 1 area over a bony prominence from his collapsed arch. The remainder of the tissue underneath the blister is intact skin. 05/12/2022: The primary wound is down to just a couple of millimeters and is flush with the surrounding skin. The area that had blistered last week had some purulent drainage coming follow-up a corner that was underneath some dry skin. No malodor associated with this. There is some additional slough that has [name], [name] ([id number]) [id number] Page 8 of 11 accumulated. 05/19/2022: His primary wound has actually healed. The area that opened after a blister formed is smaller. There  is a little bit of thickened skin around the edges, no concern for infection. 05/26/2022: The wound is stable. Unfortunately, for some reason the primary dressing was left off and there was no silver alginate applied. There was also no zinc oxide applied to the periwound so there is some tissue maceration present due to the fact that the only thing used on the wound was a foam donut, which retains the moisture from his foot. 06/02/2022: The wound measured 2 mm wider today. There is still some blue-green staining on the surface. 06/09/2022: No change to the wound dimensions. The culture that I took last week grew out Pseudomonas aeruginosa and Enterococcus faecalis. Levofloxacin was prescribed and he began taking this last Wednesday. 06/18/2022: He completed his levofloxacin yesterday. Today, the wound has not changed in its dimensions. There is callus accumulation around the wound and some slough on the wound surface. For what ever reason, we do not seem to be getting the offloading affect from the total contact cast that one might expect. 06/23/2022: The wound is a little bit smaller today. There is some undermining created by accumulated callus, but underneath the callus, there is epithelialization. Minimal slough on the surface. He has been approved for Epicord, so we will apply that today. He seems to be getting adequate offloading using the peg assist insert in his shoe. 06/30/2022: The wound is smaller today. There is no undermining. The wound surface looks healthier. Still with some periwound callus accumulation. 07/07/2022: The wound is about the same size. The center of the wound is fairly soft and mushy, however. 07/21/2022: He was out of town last week and apparently did quite a bit more walking than he had anticipated. As a result, opening the wound is smaller but he has developed significant undermining, roughly 1.5 cm nearly circumferentially. There is no malodor or purulent drainage. There is  heavy callus accumulation around the wound. 07/28/2022: There has been further breakdown and undermining that has occurred. The muscle is now exposed but there does not appear to be any evidence of necrosis. No malodor or purulent drainage. 08/04/2022: The culture that I took last week grew out a polymicrobial population and doxycycline was the recommended antibiotic for coverage. This was prescribed and he began taking it on Thursday. No real change to his wound. 08/11/22: Measured smaller, still with circumferential undermining. 08/18/2022: The wound measured slightly smaller. There is still circumferential  undermining, but the area at 12:00 is not as deep as 6-9 o'clock. He had a bit more drainage this week and the periwound is slightly macerated. The drainage appears serosanguineous. Patient History Information obtained from Patient. Social History Never smoker, Marital Status - Married, Alcohol Use - Never, Drug Use - No History, Caffeine Use - Daily - T coffee. ea; Medical History Endocrine Patient has history of Type II Diabetes Hospitalization/Surgery History - Amuptation of 3rd,4th and 5th toes of Right foot;Oral Surgery;Anal Fissure surgery; Cholecystectomy. Objective Constitutional Hypertensive, asymptomatic. no acute distress. Vitals Time Taken: 10:30 AM, Height: 72 in, Weight: 312 lbs, BMI: 42.3, Temperature: 98.8 F, Pulse: 71 bpm, Respiratory Rate: 18 breaths/min, Blood Pressure: 167/86 mmHg, Capillary Blood Glucose: 129 mg/dl. Respiratory Normal work of breathing on room air. General Notes: 08/18/2022: The wound measured slightly smaller. There is still circumferential undermining, but the area at 12:00 is not as deep as 6-9 o'clock. He had a bit more drainage this week and the periwound is slightly macerated. The drainage appears serosanguineous. Integumentary (Hair, Skin) Wound #4 status is Open. Original cause of wound was Blister. The date acquired was: 05/06/2022. The wound  has been in treatment 14 weeks. The wound is located on the Right,Distal,Lateral Foot. The wound measures 0.8cm length x 1.2cm width x 0.8cm depth; 0.754cm^2 area and 0.603cm^3 volume. There is muscle and Fat Layer (Subcutaneous Tissue) exposed. There is no tunneling noted, however, there is undermining starting at 12:00 and ending at 12:00 with a maximum distance of 2.3cm. There is a medium amount of serosanguineous drainage noted. The wound margin is well defined and not attached to the wound base. There is large (67-100%) red granulation within the wound bed. There is a small (1-33%) amount of necrotic tissue within the wound bed including Adherent Slough. The periwound skin appearance had no abnormalities noted for color. The periwound skin appearance exhibited: Callus, Maceration. The periwound skin appearance did not exhibit: Dry/Scaly. Periwound temperature was noted as No Abnormality. 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 There was a T Contact otal Cast Procedure by Duanne Guess, MD. Post procedure Diagnosis Wound #4: Same as Pre-Procedure Notes: scribed for Dr. Lady Gary by [name], RN. Plan Follow-up Appointments: Return Appointment in 1 week. - Dr. Lady Gary - room 2 Anesthetic: (In clinic) Topical Lidocaine 4% applied to wound bed Bathing/ Shower/ Hygiene: May shower with protection but do not get wound dressing(s) wet. Protect dressing(s) with water repellant cover (for example,  large plastic bag) or a cast cover and may then take shower. Off-Loading: T Contact Cast to Right Lower Extremity otal Removable cast walker boot to: Additional Orders / Instructions: Follow Nutritious Diet - incorporate protein shakes Other: - vitamin C 500 mg 3 times a day and zinc 30-50 mg a day Juven Shake 1-2 times daily. The following medication(s) was prescribed: lidocaine topical 4 % cream cream topical was prescribed at facility WOUND #4: - Foot Wound Laterality: Right, Lateral, Distal Cleanser: Soap and Water 1 x Per Day/30 Days Discharge Instructions: May shower and wash wound with dial antibacterial soap and water prior to dressing change.  Cleanser: Wound Cleanser (Generic) 1 x Per Day/30 Days Discharge Instructions: Cleanse the wound with wound cleanser prior to applying a clean dressing using gauze sponges, not tissue or cotton balls. Topical: Gentamicin 1 x Per Day/30 Days Discharge Instructions: As directed by physician Topical: Mupirocin Ointment 1 x Per Day/30 Days Discharge Instructions: Apply Mupirocin (Bactroban) as instructed Prim Dressing: Hydrofera Blue Ready Transfer Foam, 2.5x2.5 (in/in) (Generic) 1 x Per Day/30 Days ary Discharge Instructions: Apply directly to wound bed as directed Secondary Dressing: Woven Gauze Sponge, Non-Sterile 4x4 in 1 x Per Day/30 Days Discharge Instructions: Apply over primary dressing as directed. Secured With: Paper T ape, 2x10 (in/yd) (Generic) 1 x Per Day/30 Days Discharge Instructions: Secure dressing with tape as directed. Com pression Wrap: Kerlix Roll 4.5x3.1 (in/yd) (Generic) 1 x Per Day/30 Days Discharge Instructions: Apply Kerlix and Coban compression as directed. 08/18/2022: The wound measured slightly smaller. There is still circumferential undermining, but the area at 12:00 is not as deep as 6-9 o'clock. He had a bit more drainage this week and the periwound is slightly macerated. The drainage appears  serosanguineous. The wound bed was prepared for total contact casting. T opical gentamicin and mupirocin were applied and the wound was packed with Hydrofera Blue. A total contact cast was then applied in standard fashion. Follow-up in 1 week. Electronic Signature(s) Signed: 08/18/2022 10:56:46 AM By: Duanne Guess MD FACS Entered By: Duanne [name] on 08/18/2022 10:56:46 [name], [name] ([id number]) [id number] Page 10 of 11 -------------------------------------------------------------------------------- HxROS Details Patient Name: Date of Service: [name], [name] 08/18/2022 10:45 [name] Medical Record Number: [id number] Patient Account Number: [id number] Date of Birth/Sex: Treating RN: 04-20-1970 (51 y.o. [name] Primary Care Provider: Tana Conch Other Clinician: Referring Provider: Treating Provider/Extender: Arvin Collard in Treatment: 52 Information Obtained From Patient Endocrine Medical History: Positive for: Type II Diabetes Immunizations Pneumococcal Vaccine: Received Pneumococcal Vaccination: Yes Received Pneumococcal Vaccination On or After 60th Birthday: No Implantable Devices None Hospitalization / Surgery History Type of Hospitalization/Surgery Amuptation of 3rd,4th and 5th toes of Right foot;Oral Surgery;Anal Fissure surgery; Cholecystectomy Family and Social History Never smoker; Marital Status - Married; Alcohol Use: Never; Drug Use: No History; Caffeine Use: Daily - T coffee; Financial Concerns: No; Food, ea; Clothing or Shelter Needs: No; Support System Lacking: No; Transportation Concerns: No Electronic Signature(s) Signed: 08/18/2022 11:34:10 AM By: Duanne Guess MD FACS Entered By: Duanne [name] on 08/18/2022 10:54:51 -------------------------------------------------------------------------------- Total Contact Cast Details Patient Name: Date of Service: [name], [name] 08/18/2022 10:45 A  [name] Medical Record Number: [id number] Patient Account Number: [id number] Date of Birth/Sex: Treating RN: 1970-04-26 (52 y.o. [name] Primary Care Provider: Tana Conch Other Clinician: Referring Provider: Treating Provider/Extender: Arvin Collard in Treatment: 58 T Contact Cast Applied for Wound Assessment: otal Wound #4 Right,Distal,Lateral Foot Performed By: Physician Duanne Guess, MD Post Procedure Diagnosis Same as Pre-procedure Notes scribed for Dr. Lady Gary by [name], RN Electronic Signature(s) Signed: 08/18/2022 11:34:10 AM By: Duanne Guess MD FACS Signed: 08/18/2022 12:33:41 PM By: Herrington, Taylor [name], [name] ([id number]) [id number] Page 11 of 11 Entered By: [name] on 08/18/2022 10:49:09 -------------------------------------------------------------------------------- SuperBill Details Patient Name: Date of Service: [name], [name] 08/18/2022 Medical Record Number: [id number] Patient Account Number: [id number] Date of Birth/Sex: Treating RN: 1970/12/12 (52 y.o. [name] Primary Care Provider: Tana Conch Other Clinician: Referring Provider: Treating Provider/Extender: Arvin Collard in Treatment: 58 Diagnosis Coding ICD-10 Codes Code Description L97.515 Non-pressure  chronic ulcer of other part of right foot with muscle involvement without evidence of necrosis E11.49 Type 2 diabetes mellitus with other diabetic neurological complication M86.671 Other chronic osteomyelitis, right ankle and foot E11.621 Type 2 diabetes mellitus with foot ulcer Facility Procedures : CPT4 Code: [id number] 2 Description: 9445 - APPLY TOTAL CONTACT LEG CAST ICD-10 Diagnosis Description 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 w Modifier: ithout evidence of n Quantity: 1 ecrosis Physician Procedures : CPT4 Code Description  Modifier [id number] - WC PHYS LEVEL 4 - EST PT ICD-10 Diagnosis Description 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

## 2022-08-22 ENCOUNTER — Other Ambulatory Visit: Payer: Self-pay | Admitting: Family Medicine

## 2022-08-25 ENCOUNTER — Encounter (HOSPITAL_BASED_OUTPATIENT_CLINIC_OR_DEPARTMENT_OTHER): Payer: BLUE CROSS/BLUE SHIELD | Attending: General Surgery | Admitting: General Surgery

## 2022-08-25 DIAGNOSIS — L97515 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 without evidence of necrosis: Secondary | ICD-10-CM | POA: Insufficient documentation

## 2022-08-25 DIAGNOSIS — E11621 Type 2 diabetes mellitus with foot ulcer: Secondary | ICD-10-CM | POA: Insufficient documentation

## 2022-08-25 DIAGNOSIS — I10 Essential (primary) hypertension: Secondary | ICD-10-CM | POA: Insufficient documentation

## 2022-08-25 NOTE — Progress Notes (Signed)
[name] [name], [name] [name] ([id number]) [id number] Page 1 of 12 Visit Report for 08/25/2022 Chief Complaint Document Details Patient Name: Date of Service: [name], [name] RD [name] 08/25/2022 10:45 A [name] Medical Record Number: [id number] Patient Account Number: [id number] Date of Birth/Sex: Treating RN: 1970/05/25 (52 y.o. [name] Primary Care Provider: Tana Conch Other Clinician: Referring Provider: Treating Provider/Extender: Arvin Collard in Treatment: 59 Information Obtained from: Patient Chief Complaint Patients presents for treatment of an open diabetic ulcer Electronic Signature(s) Signed: 08/25/2022 11:05:39 AM By: Duanne Guess MD FACS Entered By: Duanne Guess on 08/25/2022 11:05:39 -------------------------------------------------------------------------------- Debridement Details Patient Name: Date of Service: [name], [name] [name] 08/25/2022 10:45 A [name] Medical Record Number: [id number] Patient Account Number: [id number] Date of Birth/Sex: Treating RN: 21-Oct-1970 (52 y.o. [name] [name] Primary Care Provider: Tana Conch Other Clinician: Referring Provider: Treating Provider/Extender: Arvin Collard in Treatment: 59 Debridement Performed for Assessment: Wound #4 Right,Distal,Lateral Foot Performed By: Physician Duanne Guess, MD Debridement Type: Debridement Severity of Tissue Pre Debridement: Fat layer exposed Level of Consciousness (Pre-procedure): Awake and Alert Pre-procedure Verification/Time Out Yes - 10:55 Taken: Start Time: 10:55 Pain Control: Lidocaine 5% topical ointment Percent of Wound Bed Debrided: 100% T Area Debrided (cm): otal 0.69 Tissue and other material debrided: Non-Viable, Callus, Slough, Skin: Epidermis, Slough Level: Skin/Epidermis Debridement Description: Selective/Open Wound Instrument: Curette Bleeding: Minimum Hemostasis Achieved: Pressure Response to Treatment:  Procedure was tolerated well Level of Consciousness (Post- Awake and Alert procedure): Post Debridement Measurements of Total Wound Length: (cm) 0.8 Width: (cm) 1.1 Depth: (cm) 0.4 Volume: (cm) 0.276 Character of Wound/Ulcer Post Debridement: Improved Severity of Tissue Post Debridement: Fat layer exposed Post Procedure Diagnosis [name] [name], [name] [name] ([id number]) [id number] Page 2 of 12 Same as Pre-procedure Notes scribed for Dr. Lady Gary by [name], RN Electronic Signature(s) Signed: 08/25/2022 12:37:08 PM By: Duanne Guess MD FACS Signed: 08/25/2022 4:45:47 PM By: [name] By: [name] on 08/25/2022 11:18:26 -------------------------------------------------------------------------------- HPI Details Patient Name: Date of Service: [name], [name] [name] 08/25/2022 10:45 A [name] Medical Record Number: [id number] Patient Account Number: [id number] Date of Birth/Sex: Treating RN: 05-Sep-1970 (52 y.o. 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 The most recent amputation was in August 2022. At that operation, antibiotic beads were placed in the wound. He has been managed by podiatry for his procedures and management of his wounds. He has been in a Scientist, water quality. He is on oral doxycycline. They have been using Betadine wet to dry dressings along with Iodosorb. The patient states that when he thinks the wound is getting too dry, he applies topical Neosporin. At his last visit, on June 7 of this year, the podiatrist determined that he felt the wound was stalled and referred him  to wound care for additional evaluation and management. An MRI has been ordered, but not yet scheduled or performed. Pathology from his operation in August 2022 demonstrated findings consistent with chronic osteomyelitis. Today, there is a large irregular wound on the plantar surface of his right foot, at about the level of the fifth metatarsal head. This tracks through to a pinpoint opening on the dorsal lateral portion of his foot. The intake nurse  reported purulent drainage. There is some malodor from the wound. No frank necrosis identified. 07/11/2021: Today, the wounds do not connect. I attempted multiple times from various directions and the shared tunnel is no longer open. He has some slough accumulation on the dorsal part of his foot as well as slough and callus buildup on the plantar surface. His MRI is scheduled for June 29. No purulent drainage or malodor appreciated today. 07/19/2021: The lateral foot wound has closed and there is no tunnel connecting the plantar foot wound to that site. The plantar foot wound still probes quite deeply, however. There is some slough, eschar, and nonviable tissue accumulated in the wound bed. No malodorous drainage present. His MRI was performed yesterday and is consistent with fairly extensive osteomyelitis. 07/29/2021: The patient has an appointment with infectious disease on July 18 to treat his osteomyelitis. The plantar wound still probes quite deeply, approaching bone. The orifice has narrowed quite substantially, however, making it more difficult for him to pack. 08/05/2021: The tunnel connecting the lateral foot wound to the plantar foot wound has reopened. He sees infectious disease tomorrow to discuss long-term antibiotic treatment for osteomyelitis. There was a bit of murky drainage in the wound, but this was noted after he had had topical lidocaine applied so may have just been a blob of the anesthetic. No odor or frank purulent drainage. The wound  probes deeply at the midfoot approaching bone. He does have MRI results confirming his diagnosis of osteomyelitis. He has failed to progress with conventional treatment and I think his best chance for preservation of the foot is to initiate hyperbaric oxygen therapy. 08/13/2021: The tunnel connecting the 2 wounds has closed again. He has a PICC line and is getting IV daptomycin and cefepime. EKG and chest x-ray are within normal limits. He is scheduled to start hyperbaric oxygen therapy tomorrow. The wound in his midfoot probes deeply, approaching bone. The skin at the orifice continues to heaped up and threatens to close over despite the large cavity within. No erythema, induration, or purulent drainage. The wound on his lateral foot is fairly small and quite clean. 08/19/2021: The lateral foot wound has nearly closed. The wound in his midfoot does not probe quite as deeply today. The skin at the orifice continues to try to roll in and obscure the opening. No frankly necrotic tissue appreciated. He is tolerating hyperbaric oxygen therapy. 08/26/2021: The lateral foot wound has closed completely. The wound in his midfoot is shallower again today. The wound orifice is contracting. We are using gentamicin and silver alginate. He continues on IV daptomycin and cefepime and is tolerating hyperbaric oxygen therapy without difficulty. 09/02/2021: His foot is a little bit sore today but he was up walking on it all weekend doing back to school shopping with his daughter. The tunneling is down to about 3 cm and I do not appreciate bone at the tip of the probe. The wound is clean but has some callus creating an overhanging lip at the distal aspect. He continues to receive hyperbaric oxygen therapy as well as IV daptomycin and cefepime. 09/09/2021: His wound continues to improve. The tunnel has come in by 0.9 cm. He continues to form callus around the orifice of the wound. He is tolerating hyperbaric oxygen therapy  along with his IV antibiotics. 09/16/2021: The tunneling is down to just half a centimeter. He continues to build up callus around the orifice of the wound. He completed his course of IV antibiotics and his PICC  line is scheduled to be removed this Friday. He is tolerating hyperbaric oxygen without difficulty. We have been applying topical gentamicin and silver alginate to his wound. 09/24/2021: The wound depth has come in even further. There is a little bit of callus buildup around the orifice. The opening is quite narrow, at this point. [name] [name], [name] [name] ([id number]) [id number] Page 3 of 12 09/30/2021: The wound depth is about the same this week. The opening continues to contract with callus accumulation. There is some discoloration of the skin on the lateral part of his foot and he admitted to walking more than usual this weekend and also wearing a different pair shoes. He continues to tolerate hyperbaric oxygen therapy without difficulty. 10/07/2021: The wound depth has contracted a bit and is now approximately 1 to 1.5 cm. The orifice of the wound continues to try and close in over the space. There is just some callus and skin around the margins obscuring the orifice. 10/14/2021: The wound depth has come in a little bit more. The orifice of the wound is rolling inwards with epithelium and callus, making it difficult to pack the wound. 10/21/2021: There is still 1 portion of the wound, at the lateral aspect, that is still a couple of centimeters deep. The architecture of the wound makes this somewhat challenging to access and pack. Everything else looks clean. He has his usual accumulation of callus and skin, narrowing the orifice. 10/29/2021: No significant change to the depth of the wound. The orifice continues to contract with callus and skin narrowing the opening. His wife has been packing the wound and doing an excellent job. 11/05/2021: The depth of the wound has come in  by about a centimeter. There is less callus accumulation around the orificedespite this, the wound is narrower today. We are still awaiting insurance approval for Kerecis powder. 11/12/2021: The depth of the wound continues to contract. He has accumulated some callus around the orifice, as usual. We have received a verbal confirmation that he is approved for Kerecis, but no formal paperwork has yet been received. 11/19/2021: The depth continues to come in. There is callus accumulation around the orifice, as usual. He has been formally approved for Kerecis, but unfortunately we do not have a billing mechanism for the powder in [employer] yet. We are working to address this so we can begin treatment. 11/25/2021: Continued filling in of the depth of the wound. Continued buildup of callus around the orifice. No concern for infection. As we do not have a way to bill for Kerecis powder, but are capable of billing for the [facility] sheet, we are going to use that on him instead. 12/03/2021: The depth of the wound continues to fill in. As usual, he has accumulated callus and skin around the orifice of the wound. No concern for infection. He will complete his hyperbaric oxygen therapy this week. 12/17/2021: The wound depth continues to contract. There is callus and skin accumulation around the orifice of the wound, as per usual. There is more epithelium on the actual wound surface. Unfortunately, his Darlen Round was not ordered. 12/24/2021: The wound depth has come in a little bit more this week. There is less skin accumulation around the orifice, but still some callus. On questioning, he admits to spending a fair amount of time on his feet. 12/12; this is a very difficult wound to assess size slitlike wound with some depth but minimal opening. I applied Kerecis No. 4 12/18; Kerecis No. 5 01/15/2022: His wound measured deeper  today. There is also some evidence of pressure induced tissue injury around the lateral aspect of  his foot. The patient reports that he was wearing different shoes than usual and felt like his foot was rolling a lot over the weekend. There is heavy callus around the orifice of the wound. No concern for infection. 01/21/2022: The depth decreased today from 1.7 to 1.0 cm. Significantly less tissue damage as compared to last week. Still with callus and skin accumulation around the orifice. 01/29/2022: The wound continues to contract and the depth is coming in further. We are still working on getting [employer] for him. 02/04/2022: No real change this week. Still working on the [employer]. 02/11/2022: No significant changes again. We are still working on the [employer]. 02/18/2022: No change to the wound. We have submitted in IVR for Epicord. 02/25/2022: Unfortunately, his wound has deteriorated over the past week. It is deeper and is approaching bone once again. There is more slough accumulation and the tissue shows signs of pressure injury. He says that he has been trying to wear his new work boots, which are waterproof. He says they make his feet sweat excessively. I think our main challenge with him is that we just are not able to adequately offload him while he is still working and on his feet through much of the day. He is willing to consider total contact casting, but will need to make some arrangements with work. On a more positive note, however, he has been approved for Epicord. 03/03/2022: The wound probed a little bit deeper again today. We are planning to put him in a total contact cast as soon as he has cleared it with work to take some time off. 03/10/2022: The wound was a little bit shallower today and the overall appearance is significantly improved. We are planning to put him in a cast today. 03/12/2022: He is here for his first total contact cast change. He denies any issues with pressure, rubbing, or pain. 03/17/2022: For some reason, the wound probes deeper today. There is  some slough and nonviable subcutaneous tissue around the opening. He denies any difficulty with the total contact cast. 03/24/2022: The wound is shallower today. There is better granulation tissue visible on the wound surface. 03/31/2022: His cast protector got a hole in it and his cast got wet. His foot is macerated. The wound measured a little deeper today. There is some hypertrophic granulation tissue at the orifice of his wound. 3/18; patient presents for follow-up. He had no issues with the total contact cast. We have been using epi cord to the wound bed. Overall wound is stable. Patient denies signs of infection. 04/14/2022: The depth of the wound has rather remarkably decreased to 1.3 cm today. The orifice of the wound has also contracted quite a bit. 04/21/2022: The depth of the wound has decreased even further. The orifice is also smaller. The wound is very clean. 04/28/2022: The depth of the wound is down to 0.5 cm. The orifice continues to contract. 05/06/2022: The primary wound is substantially smaller and shallower today. Unfortunately, he developed a blister distal to this on the plantar surface of his foot. The tissue breakdown underneath the blister does extend into the fat layer in 1 area over a bony prominence from his collapsed arch. The remainder of the tissue underneath the blister is intact skin. 05/12/2022: The primary wound is down to just a couple of millimeters and is flush with the surrounding skin. The area  that had blistered last week had some [name] [name], [name] [name] ([id number]) [id number] Page 4 of 12 purulent drainage coming follow-up a corner that was underneath some dry skin. No malodor associated with this. There is some additional slough that has accumulated. 05/19/2022: His primary wound has actually healed. The area that opened after a blister formed is smaller. There is a little bit of thickened skin around the edges, no concern for  infection. 05/26/2022: The wound is stable. Unfortunately, for some reason the primary dressing was left off and there was no silver alginate applied. There was also no zinc oxide applied to the periwound so there is some tissue maceration present due to the fact that the only thing used on the wound was a foam donut, which retains the moisture from his foot. 06/02/2022: The wound measured 2 mm wider today. There is still some blue-green staining on the surface. 06/09/2022: No change to the wound dimensions. The culture that I took last week grew out Pseudomonas aeruginosa and Enterococcus faecalis. Levofloxacin was prescribed and he began taking this last Wednesday. 06/18/2022: He completed his levofloxacin yesterday. Today, the wound has not changed in its dimensions. There is callus accumulation around the wound and some slough on the wound surface. For what ever reason, we do not seem to be getting the offloading affect from the total contact cast that one might expect. 06/23/2022: The wound is a little bit smaller today. There is some undermining created by accumulated callus, but underneath the callus, there is epithelialization. Minimal slough on the surface. He has been approved for Epicord, so we will apply that today. He seems to be getting adequate offloading using the peg assist insert in his shoe. 06/30/2022: The wound is smaller today. There is no undermining. The wound surface looks healthier. Still with some periwound callus accumulation. 07/07/2022: The wound is about the same size. The center of the wound is fairly soft and mushy, however. 07/21/2022: He was out of town last week and apparently did quite a bit more walking than he had anticipated. As a result, opening the wound is smaller but he has developed significant undermining, roughly 1.5 cm nearly circumferentially. There is no malodor or purulent drainage. There is heavy callus accumulation around the wound. 07/28/2022: There has been  further breakdown and undermining that has occurred. The muscle is now exposed but there does not appear to be any evidence of necrosis. No malodor or purulent drainage. 08/04/2022: The culture that I took last week grew out a polymicrobial population and doxycycline was the recommended antibiotic for coverage. This was prescribed and he began taking it on Thursday. No real change to his wound. 08/11/22: Measured smaller, still with circumferential undermining. 08/18/2022: The wound measured slightly smaller. There is still circumferential undermining, but the area at 12:00 is not as deep as 6-9 o'clock. He had a bit more drainage this week and the periwound is slightly macerated. The drainage appears serosanguineous. 08/25/2022: The wound is about the same. He still has quite a bit of undermining. He also reports having had more pain this week, along with fairly substantial drainage. Electronic Signature(s) Signed: 08/25/2022 11:06:29 AM By: Duanne Guess MD FACS Entered By: Duanne Guess on 08/25/2022 11:06:29 -------------------------------------------------------------------------------- Physical Exam Details Patient Name: Date of Service: [name], [name] [name] 08/25/2022 10:45 A [name] Medical Record Number: [id number] Patient Account Number: [id number] Date of Birth/Sex: Treating RN: 1970-02-25 (52 y.o. [name] Primary Care Provider: Tana Conch Other Clinician: Referring Provider: Treating Provider/Extender: Belva Bertin,  Elisabeth Cara in Treatment: 59 Constitutional Hypertensive, asymptomatic. . . . no acute distress. Respiratory Normal work of breathing on room air. Notes 08/25/2022: The wound is about the same. He still has quite a bit of undermining. He also reports having had more pain this week, along with fairly substantial drainage. Electronic Signature(s) Signed: 08/25/2022 11:13:42 AM By: Duanne Guess MD FACS Entered By: Duanne Guess on 08/25/2022 11:13:42 [name] [name],  [name] [name] ([id number]) [id number] Page 5 of 12 -------------------------------------------------------------------------------- Physician Orders Details Patient Name: Date of Service: [name], [name] [name] 08/25/2022 10:45 A [name] Medical Record Number: [id number] Patient Account Number: [id number] Date of Birth/Sex: Treating RN: [birth year] (52 y.o. 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 - Dr. Lady Gary - room 2 Return A Other: - call your podiatrist Dr. Allena Katz and get an appointment to see them Anesthetic (In clinic) Topical Lidocaine 5% applied to wound bed Bathing/ Shower/ Hygiene May shower with protection but do not get wound dressing(s) wet. Protect dressing(s) with water repellant cover (for example, large plastic bag) or a cast cover and may then take shower. Additional Orders / Instructions Follow Nutritious Diet - incorporate protein shakes Other: - vitamin C 500 mg 3 times a day and zinc 30-50 mg a day Juven Shake 1-2 times daily. Wound Treatment Wound #4 - Foot Wound Laterality: Right, Lateral, Distal Cleanser: Soap and Water 1 x Per Day/30 Days Discharge Instructions: May shower and wash wound with dial antibacterial soap and water prior to dressing change. Cleanser: Wound Cleanser (Generic) 1 x Per Day/30 Days Discharge Instructions: Cleanse the wound with wound cleanser prior to applying a clean  dressing using gauze sponges, not tissue or [name] balls. Topical: Gentamicin 1 x Per Day/30 Days Discharge Instructions: As directed by physician Topical: Mupirocin Ointment 1 x Per Day/30 Days Discharge Instructions: Apply Mupirocin (Bactroban) as instructed Prim Dressing: Maxorb Extra Ag+ Alginate Dressing, 2x2 (in/in) 1 x Per Day/30 Days ary Discharge Instructions: Apply to wound bed as instructed Secondary Dressing: Optifoam Non-Adhesive Dressing, 4x4 in 1 x Per Day/30 Days Discharge Instructions: Apply over primary dressing as directed. Secondary Dressing: Woven Gauze Sponge, Non-Sterile 4x4 in 1 x Per Day/30 Days Discharge Instructions: Apply over primary dressing as directed. Secondary Dressing: Zetuvit Plus 4x8 in 1 x Per Day/30 Days Discharge Instructions: Apply over primary dressing as directed. Secured With: Elastic Bandage 4 inch (ACE bandage) 1 x Per Day/30 Days Discharge Instructions: Secure with ACE bandage as directed. Secured With: [employer], 4.5x3.1 (in/yd) 1 x Per Day/30 Days Discharge Instructions: Secure with Kerlix as directed. Secured With: Paper Tape, 2x10 (in/yd) (Generic) 1 x Per Day/30 Days Discharge Instructions: Secure dressing with tape as directed. [name] [name], [name] [name] ([id number]) [id number] Page 6 of 12 Patient Medications llergies: No Known Allergies A Notifications Medication Indication Start End 08/25/2022 lidocaine DOSE topical 5 % ointment - ointment topical Electronic Signature(s) Signed: 08/25/2022 12:37:08 PM By: Duanne Guess MD FACS Entered By: Duanne Guess on 08/25/2022 11:16:31 -------------------------------------------------------------------------------- Problem List Details Patient Name: Date of Service: [name], [name] [name] 08/25/2022 10:45 A  [name] Medical Record Number: [id number] Patient Account Number: [id number] Date of Birth/Sex: Treating RN: 04/20/1970 (52 y.o. 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 07/04/2021 No Yes M86.671 Other chronic osteomyelitis, right ankle and foot 07/04/2021 No Yes E11.621 Type 2 diabetes mellitus with foot ulcer 07/04/2021 No Yes Inactive Problems Resolved Problems ICD-10 Code Description Active Date Resolved Date 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 08/25/2022 10:45 A [name] Medical Record Number: [id number] Patient Account Number: [id number] Date of Birth/Sex: Treating RN: 20-Oct-1970 (52 y.o. 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 The most recent amputation was in August 2022. At that operation, antibiotic beads were placed in the wound. He has been managed  by podiatry for his procedures and management of his wounds. He has been in a Scientist, water quality. He is on oral doxycycline. They have been using Betadine wet to dry dressings along with Iodosorb. The patient states that when he thinks the wound is getting too dry, he applies topical Neosporin. At his last visit, on June 7 of this year, the podiatrist determined that he felt the wound was stalled and referred him to wound care for additional evaluation and management. An MRI has been ordered, but not yet scheduled or performed. Pathology from his operation in August 2022 demonstrated findings consistent with chronic osteomyelitis. Today, there is a large irregular wound on the plantar surface of his right foot, at about the level of the fifth metatarsal head. This tracks through to a pinpoint opening on the dorsal lateral portion of his foot. The intake nurse reported purulent drainage. There is some malodor from the wound. No frank necrosis identified. 07/11/2021: Today, the wounds do not connect. I attempted multiple times from various directions and the shared tunnel is no longer open. He has some slough accumulation on the dorsal part of his foot as well as slough and callus buildup on the plantar surface. His MRI is scheduled for June 29. No purulent drainage or malodor appreciated today. 07/19/2021: The lateral foot wound has closed and there is no tunnel connecting the plantar foot wound to that site. The plantar foot wound still probes quite deeply, however. There is some slough, eschar, and nonviable tissue accumulated in the wound bed. No malodorous drainage present. His MRI was performed yesterday and is consistent with fairly extensive osteomyelitis. 07/29/2021: The patient has an appointment with infectious disease on July 18 to treat his osteomyelitis. The plantar wound still probes quite deeply, approaching bone. The orifice has narrowed quite substantially, however,  making it  more difficult for him to pack. 08/05/2021: The tunnel connecting the lateral foot wound to the plantar foot wound has reopened. He sees infectious disease tomorrow to discuss long-term antibiotic treatment for osteomyelitis. There was a bit of murky drainage in the wound, but this was noted after he had had topical lidocaine applied so may have just been a blob of the anesthetic. No odor or frank purulent drainage. The wound probes deeply at the midfoot approaching bone. He does have MRI results confirming his diagnosis of osteomyelitis. He has failed to progress with conventional treatment and I think his best chance for preservation of the foot is to initiate hyperbaric oxygen therapy. 08/13/2021: The tunnel connecting the 2 wounds has closed again. He has a PICC line and is getting IV daptomycin and cefepime. EKG and chest x-ray are within normal limits. He is scheduled to start hyperbaric oxygen therapy tomorrow. The wound in his midfoot probes deeply, approaching bone. The skin at the orifice continues to heaped up and threatens to close over despite the large cavity within. No erythema, induration, or purulent drainage. The wound on his lateral foot is fairly small and quite clean. 08/19/2021: The lateral foot wound has nearly closed. The wound in his midfoot does not probe quite as deeply today. The skin at the orifice continues to try to roll in and obscure the opening. No frankly necrotic tissue appreciated. He is tolerating hyperbaric oxygen therapy. 08/26/2021: The lateral foot wound has closed completely. The wound in his midfoot is shallower again today. The wound orifice is contracting. We are using gentamicin and silver alginate. He continues on IV daptomycin and cefepime and is tolerating hyperbaric oxygen therapy without difficulty. 09/02/2021: His foot is a little bit sore today but he was up walking on it all weekend doing back to school shopping with his daughter. The tunneling is down  to about 3 cm and I do not appreciate bone at the tip of the probe. The wound is clean but has some callus creating an overhanging lip at the distal aspect. He continues to receive hyperbaric oxygen therapy as well as IV daptomycin and cefepime. 09/09/2021: His wound continues to improve. The tunnel has come in by 0.9 cm. He continues to form callus around the orifice of the wound. He is tolerating hyperbaric oxygen therapy along with his IV antibiotics. 09/16/2021: The tunneling is down to just half a centimeter. He continues to build up callus around the orifice of the wound. He completed his course of IV antibiotics and his PICC line is scheduled to be removed this Friday. He is tolerating hyperbaric oxygen without difficulty. We have been applying topical gentamicin and silver alginate to his wound. 09/24/2021: The wound depth has come in even further. There is a little bit of callus buildup around the orifice. The opening is quite narrow, at this point. 09/30/2021: The wound depth is about the same this week. The opening continues to contract with callus accumulation. There is some discoloration of the skin on the lateral part of his foot and he admitted to walking more than usual this weekend and also wearing a different pair shoes. He continues to tolerate hyperbaric oxygen therapy without difficulty. 10/07/2021: The wound depth has contracted a bit and is now approximately 1 to 1.5 cm. The orifice of the wound continues to try and close in over the space. There is just some callus and skin around the margins obscuring the orifice. 10/14/2021: The wound depth has come in  a little bit more. The orifice of the wound is rolling inwards with epithelium and callus, making it difficult to pack the wound. [name] [name], [name] [name] ([id number]) [id number] Page 8 of 12 10/21/2021: There is still 1 portion of the wound, at the lateral aspect, that is still a couple of centimeters deep. The  architecture of the wound makes this somewhat challenging to access and pack. Everything else looks clean. He has his usual accumulation of callus and skin, narrowing the orifice. 10/29/2021: No significant change to the depth of the wound. The orifice continues to contract with callus and skin narrowing the opening. His wife has been packing the wound and doing an excellent job. 11/05/2021: The depth of the wound has come in by about a centimeter. There is less callus accumulation around the orificedespite this, the wound is narrower today. We are still awaiting insurance approval for Kerecis powder. 11/12/2021: The depth of the wound continues to contract. He has accumulated some callus around the orifice, as usual. We have received a verbal confirmation that he is approved for Kerecis, but no formal paperwork has yet been received. 11/19/2021: The depth continues to come in. There is callus accumulation around the orifice, as usual. He has been formally approved for Kerecis, but unfortunately we do not have a billing mechanism for the powder in [employer] yet. We are working to address this so we can begin treatment. 11/25/2021: Continued filling in of the depth of the wound. Continued buildup of callus around the orifice. No concern for infection. As we do not have a way to bill for Kerecis powder, but are capable of billing for the [facility] sheet, we are going to use that on him instead. 12/03/2021: The depth of the wound continues to fill in. As usual, he has accumulated callus and skin around the orifice of the wound. No concern for infection. He will complete his hyperbaric oxygen therapy this week. 12/17/2021: The wound depth continues to contract. There is callus and skin accumulation around the orifice of the wound, as per usual. There is more epithelium on the actual wound surface. Unfortunately, his Darlen Round was not ordered. 12/24/2021: The wound depth has come in a little bit more this week.  There is less skin accumulation around the orifice, but still some callus. On questioning, he admits to spending a fair amount of time on his feet. 12/12; this is a very difficult wound to assess size slitlike wound with some depth but minimal opening. I applied Kerecis No. 4 12/18; Kerecis No. 5 01/15/2022: His wound measured deeper today. There is also some evidence of pressure induced tissue injury around the lateral aspect of his foot. The patient reports that he was wearing different shoes than usual and felt like his foot was rolling a lot over the weekend. There is heavy callus around the orifice of the wound. No concern for infection. 01/21/2022: The depth decreased today from 1.7 to 1.0 cm. Significantly less tissue damage as compared to last week. Still with callus and skin accumulation around the orifice. 01/29/2022: The wound continues to contract and the depth is coming in further. We are still working on getting [employer] for him. 02/04/2022: No real change this week. Still working on the [employer]. 02/11/2022: No significant changes again. We are still working on the [employer]. 02/18/2022: No change to the wound. We have submitted in IVR for Epicord. 02/25/2022: Unfortunately, his wound has deteriorated over the past week. It is deeper and is approaching bone once  again. There is more slough accumulation and the tissue shows signs of pressure injury. He says that he has been trying to wear his new work boots, which are waterproof. He says they make his feet sweat excessively. I think our main challenge with him is that we just are not able to adequately offload him while he is still working and on his feet through much of the day. He is willing to consider total contact casting, but will need to make some arrangements with work. On a more positive note, however, he has been approved for Epicord. 03/03/2022: The wound probed a little bit deeper again today. We are planning  to put him in a total contact cast as soon as he has cleared it with work to take some time off. 03/10/2022: The wound was a little bit shallower today and the overall appearance is significantly improved. We are planning to put him in a cast today. 03/12/2022: He is here for his first total contact cast change. He denies any issues with pressure, rubbing, or pain. 03/17/2022: For some reason, the wound probes deeper today. There is some slough and nonviable subcutaneous tissue around the opening. He denies any difficulty with the total contact cast. 03/24/2022: The wound is shallower today. There is better granulation tissue visible on the wound surface. 03/31/2022: His cast protector got a hole in it and his cast got wet. His foot is macerated. The wound measured a little deeper today. There is some hypertrophic granulation tissue at the orifice of his wound. 3/18; patient presents for follow-up. He had no issues with the total contact cast. We have been using epi cord to the wound bed. Overall wound is stable. Patient denies signs of infection. 04/14/2022: The depth of the wound has rather remarkably decreased to 1.3 cm today. The orifice of the wound has also contracted quite a bit. 04/21/2022: The depth of the wound has decreased even further. The orifice is also smaller. The wound is very clean. 04/28/2022: The depth of the wound is down to 0.5 cm. The orifice continues to contract. 05/06/2022: The primary wound is substantially smaller and shallower today. Unfortunately, he developed a blister distal to this on the plantar surface of his foot. The tissue breakdown underneath the blister does extend into the fat layer in 1 area over a bony prominence from his collapsed arch. The remainder of the tissue underneath the blister is intact skin. 05/12/2022: The primary wound is down to just a couple of millimeters and is flush with the surrounding skin. The area that had blistered last week had some purulent  drainage coming follow-up a corner that was underneath some dry skin. No malodor associated with this. There is some additional slough that has accumulated. 05/19/2022: His primary wound has actually healed. The area that opened after a blister formed is smaller. There is a little bit of thickened skin around the edges, no concern for infection. 05/26/2022: The wound is stable. Unfortunately, for some reason the primary dressing was left off and there was no silver alginate applied. There was also no zinc oxide applied to the periwound so there is some tissue maceration present due to the fact that the only thing used on the wound was a foam donut, which retains the moisture from his foot. 06/02/2022: The wound measured 2 mm wider today. There is still some blue-green staining on the surface. [name] [name], [name] [name] ([id number]) [id number] Page 9 of 12 06/09/2022: No change to the wound dimensions. The culture that  I took last week grew out Pseudomonas aeruginosa and Enterococcus faecalis. Levofloxacin was prescribed and he began taking this last Wednesday. 06/18/2022: He completed his levofloxacin yesterday. Today, the wound has not changed in its dimensions. There is callus accumulation around the wound and some slough on the wound surface. For what ever reason, we do not seem to be getting the offloading affect from the total contact cast that one might expect. 06/23/2022: The wound is a little bit smaller today. There is some undermining created by accumulated callus, but underneath the callus, there is epithelialization. Minimal slough on the surface. He has been approved for Epicord, so we will apply that today. He seems to be getting adequate offloading using the peg assist insert in his shoe. 06/30/2022: The wound is smaller today. There is no undermining. The wound surface looks healthier. Still with some periwound callus accumulation. 07/07/2022: The wound is about the same  size. The center of the wound is fairly soft and mushy, however. 07/21/2022: He was out of town last week and apparently did quite a bit more walking than he had anticipated. As a result, opening the wound is smaller but he has developed significant undermining, roughly 1.5 cm nearly circumferentially. There is no malodor or purulent drainage. There is heavy callus accumulation around the wound. 07/28/2022: There has been further breakdown and undermining that has occurred. The muscle is now exposed but there does not appear to be any evidence of necrosis. No malodor or purulent drainage. 08/04/2022: The culture that I took last week grew out a polymicrobial population and doxycycline was the recommended antibiotic for coverage. This was prescribed and he began taking it on Thursday. No real change to his wound. 08/11/22: Measured smaller, still with circumferential undermining. 08/18/2022: The wound measured slightly smaller. There is still circumferential undermining, but the area at 12:00 is not as deep as 6-9 o'clock. He had a bit more drainage this week and the periwound is slightly macerated. The drainage appears serosanguineous. 08/25/2022: The wound is about the same. He still has quite a bit of undermining. He also reports having had more pain this week, along with fairly substantial drainage. Patient History Information obtained from Patient. Social History Never smoker, Marital Status - Married, Alcohol Use - Never, Drug Use - No History, Caffeine Use - Daily - T coffee. ea; Medical History Endocrine Patient has history of Type II Diabetes Hospitalization/Surgery History - Amuptation of 3rd,4th and 5th toes of Right foot;Oral Surgery;Anal Fissure surgery; Cholecystectomy. Objective Constitutional Hypertensive, asymptomatic. no acute distress. Vitals Time Taken: 10:31 AM, Height: 72 in, Weight: 312 lbs, BMI: 42.3, Temperature: 98 F, Pulse: 77 bpm, Respiratory Rate: 18 breaths/min, Blood  Pressure: 166/85 mmHg, Capillary Blood Glucose: 149 mg/dl. Respiratory Normal work of breathing on room air. General Notes: 08/25/2022: The wound is about the same. He still has quite a bit of undermining. He also reports having had more pain this week, along with fairly substantial drainage. Integumentary (Hair, Skin) Wound #4 status is Open. Original cause of wound was Blister. The date acquired was: 05/06/2022. The wound has been in treatment 15 weeks. The wound is located on the Right,Distal,Lateral Foot. The wound measures 0.8cm length x 1.1cm width x 0.4cm depth; 0.691cm^2 area and 0.276cm^3 volume. There is muscle and Fat Layer (Subcutaneous Tissue) exposed. There is no tunneling noted, however, there is undermining starting at 12:00 and ending at 6:00 with a maximum distance of 2.5cm. There is a medium amount of serosanguineous drainage noted. The wound margin  is well defined and not attached to the wound base. There is large (67-100%) red granulation within the wound bed. There is a small (1-33%) amount of necrotic tissue within the wound bed including Adherent Slough. The periwound skin appearance had no abnormalities noted for color. The periwound skin appearance exhibited: Callus, Maceration. The periwound skin appearance did not exhibit: Dry/Scaly. Periwound temperature was noted as No Abnormality. The periwound has tenderness on palpation. 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 There was a Selective/Open Wound Skin/Epidermis Debridement with a total area of 0.69 sq cm performed by Duanne Guess, MD. With the following instrument(s): Curette to remove Non-Viable tissue/material. Material removed includes [facility] and Skin: Epidermis and after achieving pain control using Lidocaine 5% topical ointment. No specimens were taken. A time out was conducted at 10:55, prior to the start of the procedure. A Minimum amount of bleeding was controlled with Pressure. The procedure was tolerated well. Post Debridement Measurements: 0.8cm length x 1.1cm width x 0.4cm depth; 0.276cm^3 volume. Character of Wound/Ulcer Post Debridement is improved. Severity of Tissue Post Debridement is: Fat layer exposed. Post procedure Diagnosis Wound #4: Same as Pre-Procedure General Notes: scribed for Dr. Lady Gary by [name], RN. Plan Follow-up Appointments: Return Appointment in 1 week. - Dr. Lady Gary - room 2 Other: - call your podiatrist Dr. Allena Katz and get an appointment to see them Anesthetic: (In clinic) Topical Lidocaine 5% applied to wound bed Bathing/ Shower/ Hygiene: May shower with protection but do not get wound dressing(s) wet. Protect dressing(s) with water repellant cover (for example, large plastic bag) or a cast cover and may then take shower. Additional Orders / Instructions: Follow Nutritious Diet - incorporate protein shakes Other: - vitamin C 500 mg 3 times a day and zinc 30-50 mg a day Juven Shake 1-2 times daily. The following medication(s) was prescribed: lidocaine topical 5 % ointment ointment topical was prescribed at facility WOUND #4: - Foot Wound Laterality: Right, Lateral, Distal Cleanser: Soap and Water 1 x Per Day/30 Days Discharge Instructions: May shower and wash wound with dial antibacterial soap and water prior to dressing change. Cleanser: Wound Cleanser (Generic) 1 x Per Day/30 Days Discharge Instructions: Cleanse  the wound with wound cleanser prior to applying a clean dressing using gauze sponges, not tissue or [name] balls. Topical: Gentamicin 1 x Per Day/30 Days Discharge Instructions: As directed by physician Topical: Mupirocin Ointment 1 x Per Day/30 Days Discharge Instructions: Apply Mupirocin (Bactroban) as instructed Prim Dressing: Maxorb Extra Ag+ Alginate Dressing, 2x2 (in/in) 1 x Per Day/30 Days ary Discharge Instructions: Apply to wound bed as instructed Secondary Dressing: Optifoam Non-Adhesive Dressing, 4x4 in 1 x Per Day/30 Days Discharge Instructions: Apply over primary dressing as directed. Secondary Dressing: Woven Gauze Sponge, Non-Sterile 4x4 in 1 x Per Day/30 Days Discharge Instructions: Apply over primary dressing as directed. Secondary Dressing: Zetuvit Plus 4x8 in 1 x Per Day/30 Days Discharge Instructions: Apply over primary dressing as directed. Secured With: Elastic Bandage 4 inch (ACE bandage) 1 x Per Day/30 Days Discharge Instructions: Secure with ACE bandage as  directed. Secured With: [employer], 4.5x3.1 (in/yd) 1 x Per Day/30 Days Discharge Instructions: Secure with Kerlix as directed. Secured With: Paper T ape, 2x10 (in/yd) (Generic) 1 x Per Day/30 Days Discharge Instructions: Secure dressing with tape as directed. 08/25/2022: The wound is about the same. He still has quite a bit of undermining. He also reports having had more pain this week, along with fairly substantial drainage. I used a curette to debride slough, callus, and skin from his wound. I do not think the total contact cast is having the desired effect. We will pack his wound with topical gentamicin, mupirocin, and silver alginate. His wife will change this at home. We will use a peg assist insert for offloading. I have sent a message to his podiatrist to see if he could be considered for possible surgical debridement and the patient will also contact his podiatrist as well. His vascular studies that  we repeated in July suggest that he has adequate inflow to heal these wounds. I did consider getting repeat x-rays, but his podiatrist will likely perform these. He may end up needing an MRI to better establish what etiologies may be hindering his healing. He will follow-up with me in 1 week. Electronic Signature(s) Signed: 08/25/2022 11:19:11 AM By: Duanne Guess MD FACS Entered By: Duanne Guess on 08/25/2022 11:19:11 [name] [name], [name] [name] ([id number]) [id number] Page 11 of 12 -------------------------------------------------------------------------------- HxROS Details Patient Name: Date of Service: [name], [name] [name] 08/25/2022 10:45 A [name] Medical Record Number: [id number] Patient Account Number: [id number] Date of Birth/Sex: Treating RN: [birth year] (52 y.o. [name] Primary Care Provider: Tana Conch Other Clinician: Referring Provider: Treating Provider/Extender: Arvin Collard in Treatment: 59 Information Obtained From Patient Endocrine Medical History: Positive for: Type II Diabetes Immunizations Pneumococcal Vaccine: Received Pneumococcal Vaccination: Yes Received Pneumococcal Vaccination On or After 60th Birthday: No Implantable Devices None Hospitalization / Surgery History Type of Hospitalization/Surgery Amuptation of 3rd,4th and 5th toes of Right foot;Oral Surgery;Anal Fissure surgery; Cholecystectomy Family and Social History Never smoker; Marital Status - Married; Alcohol Use: Never; Drug Use: No History; Caffeine Use: Daily - T coffee; Financial Concerns: No; Food, ea; Clothing or Shelter Needs: No; Support System Lacking: No; Transportation Concerns: No Electronic Signature(s) Signed: 08/25/2022 12:37:08 PM By: Duanne Guess MD FACS Entered By: Duanne Guess on 08/25/2022 11:06:38 -------------------------------------------------------------------------------- SuperBill Details Patient Name: Date of Service: SPA  IN, [name] [name] 08/25/2022 Medical Record Number: [id number] Patient Account Number: [id number] Date of Birth/Sex: Treating RN: 1970-12-06 (52 y.o. 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 E11.49 Type 2 diabetes mellitus with other diabetic neurological complication M86.671 Other chronic osteomyelitis, right ankle and foot E11.621 Type 2 diabetes mellitus with foot ulcer Facility Procedures [name] [name], [name] [name] ([id number]): CPT4 Code Description [id number] [phone] - DEBRIDE WOUND 1ST 20 SQ CM OR < ICD-10 Diagnosis Description 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 [id number] Page 12 of 12: Modifier Quantity 1 involvement without evidence of necrosis Physician Procedures : CPT4 Code Description Modifier [id number] - WC PHYS LEVEL 4 - EST PT 25 ICD-10 Diagnosis Description 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

## 2022-08-25 NOTE — Progress Notes (Signed)
[name], [name] ([id number]) [id number] Page 1 of 7 Visit Report for 08/25/2022 Arrival Information Details Patient Name: Date of Service: [name], [name] 08/25/2022 10:45 [name] Medical Record Number: [id number] Patient Account Number: [id number] Date of Birth/Sex: Treating RN: 10/25/1970 (52 y.o. [name] Primary Care : Tana Conch Other Clinician: Referring : Treating /Extender: Arvin Collard in Treatment: 34 Visit Information History Since Last Visit Added or deleted any medications: No Patient Arrived: Ambulatory Any [name] allergies or adverse reactions: No Arrival Time: 10:28 Had a fall or experienced change in No Accompanied By: self activities of daily living that may affect Transfer Assistance: None risk of falls: Patient Identification Verified: Yes Signs or symptoms of abuse/neglect since last visito No Secondary Verification Process Completed: Yes Hospitalized since last visit: No Patient Requires Transmission-Based Precautions: No Implantable device outside of the clinic excluding No Patient Has Alerts: Yes cellular tissue based products placed in the center since last visit: Has Dressing in Place as Prescribed: Yes Has Footwear/Offloading in Place as Prescribed: Yes Right: T Contact Cast otal Pain Present Now: No Electronic Signature(s) Signed: 08/25/2022 4:45:47 PM By: [name] Entered By: [name] on 08/25/2022 10:29:02 -------------------------------------------------------------------------------- Encounter Discharge Information Details Patient Name: Date of Service: [name], [name] 08/25/2022 10:45 [name] Medical Record Number: [id number] Patient Account Number: [id number] Date of Birth/Sex: Treating RN: 03-28-1970 (52 y.o. [name] Primary Care : Tana Conch Other Clinician: Referring : Treating /Extender:  Arvin Collard in Treatment: 51 Encounter Discharge Information Items Post Procedure Vitals Discharge Condition: Stable Temperature (F): 98 Ambulatory Status: Ambulatory Pulse (bpm): 77 Discharge Destination: Home Respiratory Rate (breaths/min): 18 Transportation: Private Auto Blood Pressure (mmHg): 166/85 Accompanied By: self Schedule Follow-up Appointment: Yes Clinical Summary of Care: Patient Declined Electronic Signature(s) Signed: 08/25/2022 4:45:47 PM By: [name] Entered By: [name] on 08/25/2022 11:21:47 [name], [name] ([id number]) [id number] Page 2 of 7 -------------------------------------------------------------------------------- Lower Extremity Assessment Details Patient Name: Date of Service: [name], [name] 08/25/2022 10:45 [name] Medical Record Number: [id number] Patient Account Number: [id number] Date of Birth/Sex: Treating RN: 05-26-1970 (52 y.o. [name] Primary Care : Tana Conch Other Clinician: Referring : Treating /Extender: Arvin Collard in Treatment: 59 Edema Assessment Assessed: Kyra Searles: No] Franne Forts: No] Edema: [Left: N] [Right: o] Calf Left: Right: Point of Measurement: From Medial Instep 41.2 cm Ankle Left: Right: Point of Measurement: From Medial Instep 25 cm Vascular Assessment Pulses: Dorsalis Pedis Palpable: [Right:Yes] Extremity colors, hair growth, and conditions: Extremity Color: [Right:Hyperpigmented] Hair Growth on Extremity: [Right:No] Temperature of Extremity: [Right:Warm < 3 seconds] Electronic Signature(s) Signed: 08/25/2022 4:45:47 PM By: [name] Entered By: [name] on 08/25/2022 10:41:56 -------------------------------------------------------------------------------- Multi Wound Chart Details Patient Name: Date of Service: [name], [name] 08/25/2022 10:45 [name] Medical  Record Number: [id number] Patient Account Number: [id number] Date of Birth/Sex: Treating RN: [birth year] (52 y.o. [name] Primary Care : Tana Conch Other Clinician: Referring : Treating /Extender: Arvin Collard in Treatment: 59 Vital Signs Height(in): 72 Capillary Blood Glucose(mg/dl): 557 Weight(lbs): 322 Pulse(bpm): 77 Body Mass Index(BMI): 42.3 Blood Pressure(mmHg): 166/85 Temperature(F): 98 Respiratory Rate(breaths/min): 18 [4:Photos:] [N/A:N/A] Right, Distal, Lateral Foot N/A N/A Wound Location: Blister N/A N/A Wounding Event: Diabetic Wound/Ulcer of the Lower N/A N/A Primary Etiology: Extremity Type II Diabetes N/A N/A Comorbid History: 05/06/2022 N/A N/A Date Acquired: 15 N/A N/A Weeks of Treatment: Open N/A N/A Wound Status: No  N/A N/A Wound Recurrence: 0.8x1.1x0.4 N/A N/A Measurements L x W x D (cm) 0.691 N/A N/A A (cm) : rea 0.276 N/A N/A Volume (cm) : 56.00% N/A N/A % Reduction in A rea: -75.80% N/A N/A % Reduction in Volume: 12 Starting Position 1 (o'clock): 6 Ending Position 1 (o'clock): 2.5 Maximum Distance 1 (cm): Yes N/A N/A Undermining: Grade 2 N/A N/A Classification: Medium N/A N/A Exudate A mount: Serosanguineous N/A N/A Exudate Type: red, brown N/A N/A Exudate Color: Well defined, not attached N/A N/A Wound Margin: Large (67-100%) N/A N/A Granulation A mount: Red N/A N/A Granulation Quality: Small (1-33%) N/A N/A Necrotic A mount: Fat Layer (Subcutaneous Tissue): Yes N/A N/A Exposed Structures: Muscle: Yes Fascia: No Tendon: No Joint: No Bone: No Small (1-33%) N/A N/A Epithelialization: Callus: Yes N/A N/A Periwound Skin Texture: Maceration: Yes N/A N/A Periwound Skin Moisture: Dry/Scaly: No No Abnormalities Noted N/A N/A Periwound Skin Color: No Abnormality N/A N/A Temperature: Yes N/A N/A Tenderness on Palpation: T Contact Cast otal N/A N/A Procedures  Performed: Treatment Notes Electronic Signature(s) Signed: 08/25/2022 11:04:34 AM By: Duanne Guess MD FACS Entered By: Duanne Guess on 08/25/2022 11:04:33 -------------------------------------------------------------------------------- Multi-Disciplinary Care Plan Details Patient Name: Date of Service: [name], [name] 08/25/2022 10:45 [name] Medical Record Number: [id number] Patient Account Number: [id number] Date of Birth/Sex: Treating RN: [birth year] (52 y.o. [name] Primary Care : Tana Conch Other Clinician: Referring : Treating /Extender: Arvin Collard in Treatment: 9 Multidisciplinary Care Plan reviewed with physician Active Inactive Nutrition Nursing Diagnoses: Impaired glucose control: actual or potential Potential for alteratiion in Nutrition/Potential for imbalanced nutrition [name], [name] ([id number]) [id number] Page 4 of 7 Goals: Patient/caregiver will maintain therapeutic glucose control Date Initiated: 07/29/2021 Target Resolution Date: 09/19/2022 Goal Status: Active Interventions: Assess patient nutrition upon admission and as needed per policy Provide education on elevated blood sugars and impact on wound healing Treatment Activities: Patient referred to Primary Care Physician for further nutritional evaluation : 07/29/2021 Notes: Wound/Skin Impairment Nursing Diagnoses: Impaired tissue integrity Goals: Patient/caregiver will verbalize understanding of skin care regimen Date Initiated: 07/04/2021 Target Resolution Date: 09/19/2022 Goal Status: Active Interventions: Assess ulceration(s) every visit Treatment Activities: Skin care regimen initiated : 07/04/2021 Notes: Electronic Signature(s) Signed: 08/25/2022 4:45:47 PM By: [name] Entered By: [name] on 08/25/2022  11:19:49 -------------------------------------------------------------------------------- Pain Assessment Details Patient Name: Date of Service: [name], [name] 08/25/2022 10:45 [name] Medical Record Number: [id number] Patient Account Number: [id number] Date of Birth/Sex: Treating RN: 1970-08-19 (52 y.o. [name] Primary Care : Tana Conch Other Clinician: Referring : Treating /Extender: Arvin Collard in Treatment: 60 Active Problems Location of Pain Severity and Description of Pain Patient Has Paino Yes Site Locations Pain Location: Pain in Ulcers Duration of the Pain. Constant / Intermittento Intermittent Rate the pain. Current Pain Level: 1 Character of Pain [name], [name] ([id number]) [id number] Page 5 of 7 Describe the Pain: Tender Pain Management and Medication Current Pain Management: Electronic Signature(s) Signed: 08/25/2022 4:45:47 PM By: [name] Entered By: [name] on 08/25/2022 10:46:12 -------------------------------------------------------------------------------- Patient/Caregiver Education Details Patient Name: Date of Service: [name], [name] 8/5/2024andnbsp10:45 [name] Medical Record Number: [id number] Patient Account Number: [id number] Date of Birth/Gender: Treating RN: 1971/01/05 (52 y.o. [name] Primary Care Physician: Tana Conch Other Clinician: Referring Physician: Treating Physician/Extender: Arvin Collard in Treatment: 24 Education Assessment Education Provided To: Patient Education Topics Provided Wound/Skin Impairment: Methods: Explain/Verbal Responses:  Reinforcements needed, State content correctly Electronic Signature(s) Signed: 08/25/2022 4:45:47 PM By: [name] Entered By: [name] on 08/25/2022  11:20:55 -------------------------------------------------------------------------------- Wound Assessment Details Patient Name: Date of Service: [name], [name] 08/25/2022 10:45 [name] Medical Record Number: [id number] Patient Account Number: [id number] Date of Birth/Sex: Treating RN: [birth year] (52 y.o. [name] Primary Care : Tana Conch Other Clinician: Referring : Treating /Extender: Arvin Collard in Treatment: 59 Wound Status Wound Number: 4 Primary Etiology: Diabetic Wound/Ulcer of the Lower Extremity Wound Location: Right, Distal, Lateral Foot Wound Status: Open Wounding Event: Blister Comorbid History: Type II Diabetes Date Acquired: 05/06/2022 Weeks Of Treatment: 15 Clustered Wound: No Photos [name], [name] ([id number]) [id number] Page 6 of 7 Wound Measurements Length: (cm) 0.8 Width: (cm) 1.1 Depth: (cm) 0.4 Area: (cm) 0.691 Volume: (cm) 0.276 % Reduction in Area: 56% % Reduction in Volume: -75.8% Epithelialization: Small (1-33%) Tunneling: No Undermining: Yes Starting Position (o'clock): 12 Ending Position (o'clock): 6 Maximum Distance: (cm) 2.5 Wound Description Classification: Grade 2 Wound Margin: Well defined, not attached Exudate Amount: Medium Exudate Type: Serosanguineous Exudate Color: red, brown Foul Odor After Cleansing: No Slough/Fibrino Yes Wound Bed Granulation Amount: Large (67-100%) Exposed Structure Granulation Quality: Red Fascia Exposed: No Necrotic Amount: Small (1-33%) Fat Layer (Subcutaneous Tissue) Exposed: Yes Necrotic Quality: Adherent Slough Tendon Exposed: No Muscle Exposed: Yes Necrosis of Muscle: No Joint Exposed: No Bone Exposed: No Periwound Skin Texture Texture Color No Abnormalities Noted: No No Abnormalities Noted: Yes Callus: Yes Temperature / Pain Temperature: No Abnormality Moisture No Abnormalities Noted:  No Tenderness on Palpation: Yes Dry / Scaly: No Maceration: Yes Treatment Notes Wound #4 (Foot) Wound Laterality: Right, Lateral, Distal Cleanser Soap and Water Discharge Instruction: May shower and wash wound with dial antibacterial soap and water prior to dressing change. Wound Cleanser Discharge Instruction: Cleanse the wound with wound cleanser prior to applying a clean dressing using gauze sponges, not tissue or cotton balls. Peri-Wound Care Topical Gentamicin Discharge Instruction: As directed by physician Mupirocin Ointment Discharge Instruction: Apply Mupirocin (Bactroban) as instructed Primary Dressing Maxorb Extra Ag+ Alginate Dressing, 2x2 (in/in) Discharge Instruction: Apply to wound bed as instructed [address] [name], [name] ([id number]) [id number] Page 7 of 7 Optifoam Non-Adhesive Dressing, 4x4 in Discharge Instruction: Apply over primary dressing as directed. Woven Gauze Sponge, Non-Sterile 4x4 in Discharge Instruction: Apply over primary dressing as directed. Zetuvit Plus 4x8 in Discharge Instruction: Apply over primary dressing as directed. Secured With Elastic Bandage 4 inch (ACE bandage) Discharge Instruction: Secure with ACE bandage as directed. Kerlix Roll Sterile, 4.5x3.1 (in/yd) Discharge Instruction: Secure with Kerlix as directed. Paper Tape, 2x10 (in/yd) Discharge Instruction: Secure dressing with tape as directed. Compression Wrap Compression Stockings Add-Ons Electronic Signature(s) Signed: 08/25/2022 4:45:47 PM By: [name] Entered By: [name] on 08/25/2022 10:47:43 -------------------------------------------------------------------------------- Vitals Details Patient Name: Date of Service: [name], [name] 08/25/2022 10:45 [name] Medical Record Number: [id number] Patient Account Number: [id number] Date of Birth/Sex: Treating RN: [birth year] (52 y.o. [name] Primary Care  : Tana Conch Other Clinician: Referring : Treating /Extender: Arvin Collard in Treatment: 59 Vital Signs Time Taken: 10:31 Temperature (F): 98 Height (in): 72 Pulse (bpm): 77 Weight (lbs): 312 Respiratory Rate (breaths/min): 18 Body Mass Index (BMI): 42.3 Blood Pressure (mmHg): 166/85 Capillary Blood Glucose (mg/dl): 629 Reference Range: 80 - 120 mg / dl Electronic Signature(s) Signed: 08/25/2022 4:45:47 PM By: [name] Entered By: [name] on 08/25/2022 10:34:01

## 2022-08-29 ENCOUNTER — Ambulatory Visit: Payer: BLUE CROSS/BLUE SHIELD | Admitting: Podiatry

## 2022-08-29 ENCOUNTER — Ambulatory Visit (INDEPENDENT_AMBULATORY_CARE_PROVIDER_SITE_OTHER): Payer: BLUE CROSS/BLUE SHIELD

## 2022-08-29 DIAGNOSIS — M14671 Charcot's joint, right ankle and foot: Secondary | ICD-10-CM

## 2022-08-29 DIAGNOSIS — E11621 Type 2 diabetes mellitus with foot ulcer: Secondary | ICD-10-CM

## 2022-08-29 DIAGNOSIS — M898X9 Other specified disorders of bone, unspecified site: Secondary | ICD-10-CM | POA: Diagnosis not present

## 2022-08-29 DIAGNOSIS — L97411 Non-pressure chronic ulcer of right heel and midfoot limited to breakdown of skin: Secondary | ICD-10-CM

## 2022-08-29 DIAGNOSIS — Z01818 Encounter for other preprocedural examination: Secondary | ICD-10-CM

## 2022-08-29 NOTE — Progress Notes (Signed)
Subjective:  Patient ID: [name], male    DOB: 1970/02/12,  MRN: [id number]  Chief Complaint  Patient presents with   Hammer Toe    52 y.o. male presents with the above complaint.  Patient presents with chronic wound on the plantar surface of the right midfoot.  That has been present for quite some time.  He has been seeing at the wound care center without any resolve meant of the wound.  The wound care center sent him over to me for possible surgical intervention to reduce the internal pressure.  He denies seeing anyone else prior to seeing me denies any other acute complaints.  He would like to discuss surgical options for this   Review of Systems: Negative except as noted in the HPI. Denies N/V/F/Ch.  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   (06-17-2019 pt stated has been stable with no changes and followed by pcp)   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HCC)    11-06-2016  lower extremity angiography (dr Edilia Bo)  mil tibial disease w/ occlusion of the distal peroneal artery and dorsalis pedis artery but has widely patent posterior tibial and anterior tibial arterties   Type 2 diabetes mellitus treated with insulin (HCC)    followed by pcp   (06-17-2019  pt stated checks blood sugar daily in am,  fasting sugar-- average 71)   Vitamin B 12 deficiency     Current Outpatient Medications:    ibuprofen (ADVIL) 800 MG tablet, Take 1 tablet  (800 mg total) by mouth every 6 (six) hours as needed., Disp: 60 tablet, Rfl: 1   oxyCODONE-acetaminophen (PERCOCET) 5-325 MG tablet, Take 1 tablet by mouth every 4 (four) hours as needed for severe pain., Disp: 30 tablet, Rfl: 0   ascorbic acid (VITAMIN C) 500 MG tablet, Take 500 mg by mouth daily., Disp: , Rfl:    aspirin EC 81 MG tablet, Take 81 mg by mouth daily., Disp: , Rfl:    atorvastatin (LIPITOR) 80 MG tablet, TAKE 1 TABLET BY MOUTH EVERY DAY, Disp: 90 tablet, Rfl: 3   Cyanocobalamin (VITAMIN B-12) 5000 MCG SUBL, Place 5,000 mcg under the tongue daily., Disp: , Rfl:    fenofibrate (TRICOR) 48 MG tablet, TAKE 1 TABLET BY MOUTH EVERY DAY (Patient taking differently: Take 48 mg by mouth daily.), Disp: 90 tablet, Rfl: 4   glucose blood (ACCU-CHEK GUIDE) test strip, Dx E11.49 - Use to check blood glucose three times daily or as directed., Disp: 300 each, Rfl: 1   hydrochlorothiazide (MICROZIDE) 12.5 MG capsule, TAKE 1 CAPSULE BY MOUTH EVERY DAY IN THE MORNING (Patient taking differently: Take 12.5 mg by mouth daily. TAKE 1 CAPSULE BY MOUTH EVERY DAY IN THE MORNING), Disp: 100 capsule, Rfl: 3   insulin aspart (NOVOLOG FLEXPEN) 100 UNIT/ML FlexPen, INJECT 15-25 UNITS INTO THE SKIN 3 TIMES DAILY WITH MEALS. PLEASE PROVIDE  PEN NEEDLES IF POSSIBLE, Disp: 15 mL, Rfl: 11   insulin degludec (TRESIBA FLEXTOUCH) 200 UNIT/ML FlexTouch Pen, INJECT 80-100 UNITS INTO THE SKIN DAILY., Disp: 9 mL, Rfl: 3   Insulin Pen Needle 31G X 8 MM MISC, Use to inject insulin daily. Dx: E11.9, Disp: 300 each, Rfl: 11   Insulin Syringe-Needle U-100 (BD INSULIN SYRINGE U/F) 31G X 5/16" 0.5 ML MISC, Use to inject insulin 4 times daily. DX:E11.9, Disp: 100 each, Rfl: 4   Insulin Syringe-Needle U-100 (BD VEO INSULIN SYR U/F 1/2UNIT) 31G X 15/64" 0.3 ML MISC, USE TO INJECT INSULIN FOUR TIMES DAILY AS DIRECTED, Disp: 300 each, Rfl: 2   JANUVIA 50 MG tablet, TAKE 1 TABLET BY MOUTH EVERY DAY IN THE MORNING, Disp: 90 tablet, Rfl: 1    Lancets (ONETOUCH ULTRASOFT) lancets, Use to test blood sugars daily. Dx: E11.9, Disp: 100 each, Rfl: 12   Lancets MISC, USED TO CHECK BLOOD GLUCOSE 3-4 TIMES DAILY, Disp: 200 each, Rfl: 3   Multiple Vitamin (MULTIVITAMIN WITH MINERALS) TABS tablet, Take 1 tablet by mouth 2 (two) times daily., Disp: , Rfl:    Omega-3 Fatty Acids (FISH OIL) 1360 MG CAPS, Take 1,360 mg by mouth daily. , Disp: , Rfl:    pyridoxine (B-6) 100 MG tablet, Take 100 mg by mouth 2 (two) times daily., Disp: , Rfl:    ramipril (ALTACE) 5 MG capsule, TAKE 1 CAPSULE BY MOUTH EVERY DAY IN THE MORNING (Patient taking differently: Take 5 mg by mouth daily.), Disp: 90 capsule, Rfl: 4  Social History   Tobacco Use  Smoking Status Never  Smokeless Tobacco Never    No Known Allergies Objective:  There were no vitals filed for this visit. There is no height or weight on file to calculate BMI. Constitutional Well developed. Well nourished.  Vascular Dorsalis pedis pulses palpable bilaterally. Posterior tibial pulses palpable bilaterally. Capillary refill normal to all digits.  No cyanosis or clubbing noted. Pedal hair growth normal.  Neurologic Normal speech. Oriented to person, place, and time. Epicritic sensation to light touch grossly present bilaterally.  Dermatologic Right plantar midfoot wound with fat layer exposed does not probe down to bone no purulent drainage noted nice granular wound bed noted.  Orthopedic: Exostosis bony noted with exostosis of the fifth metatarsal base leading to underlying pressure and chronic wound   Radiographs: None Assessment:   1. Charcot's joint of foot, right   2. Bony exostosis   3. Diabetic ulcer of right midfoot associated with type 2 diabetes mellitus, limited to breakdown of skin (HCC)   4. Encounter for preoperative examination for general surgical procedure    Plan:  Patient was evaluated and treated and all questions answered.  Right foot Bony exostosis with  underlying ulceration with fat layer exposed with underlying Charcot deformity and diabetes -All questions and concerns were discussed with the patient in extensive detail -Given that he has failed all conservative treatment options including offloading padding protecting the wound care center I believe patient will benefit from exostectomy of the fifth metatarsal base partially with excisional debridement of the wound.  I discussed my preoperative intra or postoperative plan with the patient in extensive detail.  I am hopeful that internal pressure reduction will help with healing the wound quicker.  He states understanding would like to proceed with surgery -Informed surgical risk consent was reviewed and read aloud to the patient.  I reviewed the films.  I have discussed my findings with the patient in great detail.  I have discussed all risks including but not limited to infection, stiffness, scarring, limp, disability, deformity, damage to blood vessels and nerves, numbness, poor healing, need for braces, arthritis, chronic pain, amputation, death.  All benefits and realistic expectations discussed in great detail.  I have made no promises as to the outcome.  I have provided realistic expectations.  I have offered the patient a 2nd opinion, which they have declined and assured me they preferred to proceed despite the risks]   No follow-ups on file.

## 2022-09-01 ENCOUNTER — Telehealth: Payer: Self-pay | Admitting: Podiatry

## 2022-09-01 ENCOUNTER — Ambulatory Visit (HOSPITAL_BASED_OUTPATIENT_CLINIC_OR_DEPARTMENT_OTHER): Payer: BLUE CROSS/BLUE SHIELD | Admitting: Internal Medicine

## 2022-09-01 NOTE — Telephone Encounter (Signed)
DOS 09/04/2022  TARSAL EXOSTECTOMY RT - 28104 BONE DEBRIDEMENT RT - 11044  [employer] EFFECTIVE DATE - 11/09/2019  DED - $0 REMAINING OOP - $0 REMAINING  PER CHELSEA G WITH [employer], CPT CODES 01093 AND 11044 DO NOT REQUIRE AUTHORIZATION.   REFERENCE - CHELSEA G 08/29/22 11:53AM EST

## 2022-09-04 ENCOUNTER — Other Ambulatory Visit: Payer: Self-pay | Admitting: Podiatry

## 2022-09-04 ENCOUNTER — Encounter (INDEPENDENT_AMBULATORY_CARE_PROVIDER_SITE_OTHER): Payer: Self-pay

## 2022-09-04 DIAGNOSIS — M14671 Charcot's joint, right ankle and foot: Secondary | ICD-10-CM

## 2022-09-04 MED ORDER — IBUPROFEN 800 MG PO TABS: 800.0000 mg | ORAL_TABLET | Freq: Four times a day (QID) | ORAL | 1 refills | Status: DC | PRN

## 2022-09-04 MED ORDER — OXYCODONE-ACETAMINOPHEN 5-325 MG PO TABS: 1.0000 | ORAL_TABLET | ORAL | 0 refills | Status: DC | PRN

## 2022-09-08 ENCOUNTER — Ambulatory Visit (HOSPITAL_BASED_OUTPATIENT_CLINIC_OR_DEPARTMENT_OTHER): Payer: BLUE CROSS/BLUE SHIELD | Admitting: General Surgery

## 2022-09-09 ENCOUNTER — Other Ambulatory Visit: Payer: Self-pay | Admitting: Family Medicine

## 2022-09-10 ENCOUNTER — Ambulatory Visit (INDEPENDENT_AMBULATORY_CARE_PROVIDER_SITE_OTHER): Payer: BLUE CROSS/BLUE SHIELD | Admitting: Podiatry

## 2022-09-10 ENCOUNTER — Encounter: Payer: Self-pay | Admitting: Podiatry

## 2022-09-10 DIAGNOSIS — E11621 Type 2 diabetes mellitus with foot ulcer: Secondary | ICD-10-CM | POA: Diagnosis not present

## 2022-09-10 DIAGNOSIS — M14671 Charcot's joint, right ankle and foot: Secondary | ICD-10-CM

## 2022-09-10 DIAGNOSIS — Z9889 Other specified postprocedural states: Secondary | ICD-10-CM

## 2022-09-10 DIAGNOSIS — L97411 Non-pressure chronic ulcer of right heel and midfoot limited to breakdown of skin: Secondary | ICD-10-CM | POA: Diagnosis not present

## 2022-09-10 DIAGNOSIS — M898X9 Other specified disorders of bone, unspecified site: Secondary | ICD-10-CM

## 2022-09-10 NOTE — Progress Notes (Signed)
Subjective:  Patient ID: [name], male    DOB: 07-24-1970,  MRN: [id number]  Chief Complaint  Patient presents with   Routine Post Op    Rm14: POV # 1 DOS 09/04/22 --- RIGHT FOOT EXCISION DEBRIDEMENT OF THE WOUND WITH EXOSTECOTOMY OF TARSAL BONES    52 y.o. male presents for wound care.  Patient presents with status post right excisional debridement of the wound with exostectomy of the fifth metatarsal base.  He states he is doing okay no pain noted.  Discomfort bandages clean dry and intact.   Review of Systems: Negative except as noted in the HPI. Denies N/V/F/Ch.  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   (06-17-2019 pt stated has been stable with no changes and followed by pcp)   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HCC)    11-06-2016  lower extremity angiography (dr Edilia Bo)  mil tibial disease w/ occlusion of the distal peroneal artery and dorsalis pedis artery but has widely patent posterior tibial and anterior tibial arterties   Type 2 diabetes mellitus treated with insulin (HCC)    followed by pcp   (06-17-2019  pt stated checks blood sugar daily in am,  fasting sugar-- average 71)   Vitamin B 12 deficiency     Current Outpatient Medications:    ascorbic acid (VITAMIN C) 500 MG tablet, Take 500 mg by mouth daily., Disp: , Rfl:    aspirin EC 81 MG tablet, Take 81 mg by mouth daily., Disp: , Rfl:    atorvastatin (LIPITOR) 80 MG  tablet, TAKE 1 TABLET BY MOUTH EVERY DAY, Disp: 90 tablet, Rfl: 3   Cyanocobalamin (VITAMIN B-12) 5000 MCG SUBL, Place 5,000 mcg under the tongue daily., Disp: , Rfl:    fenofibrate (TRICOR) 48 MG tablet, TAKE 1 TABLET BY MOUTH EVERY DAY (Patient taking differently: Take 48 mg by mouth daily.), Disp: 90 tablet, Rfl: 4   glucose blood (ACCU-CHEK GUIDE) test strip, Dx E11.49 - Use to check blood glucose three times daily or as directed., Disp: 300 each, Rfl: 1   hydrochlorothiazide (MICROZIDE) 12.5 MG capsule, TAKE 1 CAPSULE BY MOUTH EVERY DAY IN THE MORNING (Patient taking differently: Take 12.5 mg by mouth daily. TAKE 1 CAPSULE BY MOUTH EVERY DAY IN THE MORNING), Disp: 100 capsule, Rfl: 3   ibuprofen (ADVIL) 800 MG tablet, Take 1 tablet (800 mg total) by mouth every 6 (six) hours as needed., Disp: 60 tablet, Rfl: 1   insulin aspart (NOVOLOG FLEXPEN) 100 UNIT/ML FlexPen, INJECT 15-25 UNITS INTO THE SKIN 3 TIMES DAILY WITH MEALS. PLEASE PROVIDE PEN NEEDLES IF POSSIBLE, Disp: 15 mL, Rfl: 11   insulin degludec (TRESIBA FLEXTOUCH) 200 UNIT/ML FlexTouch Pen, INJECT 80-100 UNITS INTO THE SKIN DAILY., Disp: 9 mL, Rfl: 3   Insulin Pen Needle 31G X 8 MM MISC, Use to inject insulin daily. Dx: E11.9, Disp: 300 each, Rfl:  11   Insulin Syringe-Needle U-100 (BD INSULIN SYRINGE U/F) 31G X 5/16" 0.5 ML MISC, Use to inject insulin 4 times daily. DX:E11.9, Disp: 100 each, Rfl: 4   Insulin Syringe-Needle U-100 (BD VEO INSULIN SYR U/F 1/2UNIT) 31G X 15/64" 0.3 ML MISC, USE TO INJECT INSULIN FOUR TIMES DAILY AS DIRECTED, Disp: 300 each, Rfl: 2   JANUVIA 50 MG tablet, TAKE 1 TABLET BY MOUTH EVERY DAY IN THE MORNING, Disp: 90 tablet, Rfl: 1   Lancets (ONETOUCH ULTRASOFT) lancets, Use to test blood sugars daily. Dx: E11.9, Disp: 100 each, Rfl: 12   Lancets MISC, USED TO CHECK BLOOD GLUCOSE 3-4 TIMES DAILY, Disp: 200 each, Rfl: 3   Multiple Vitamin (MULTIVITAMIN WITH MINERALS) TABS tablet, Take 1 tablet by mouth 2 (two) times  daily., Disp: , Rfl:    Omega-3 Fatty Acids (FISH OIL) 1360 MG CAPS, Take 1,360 mg by mouth daily. , Disp: , Rfl:    oxyCODONE-acetaminophen (PERCOCET) 5-325 MG tablet, Take 1 tablet by mouth every 4 (four) hours as needed for severe pain., Disp: 30 tablet, Rfl: 0   pyridoxine (B-6) 100 MG tablet, Take 100 mg by mouth 2 (two) times daily., Disp: , Rfl:    ramipril (ALTACE) 5 MG capsule, TAKE 1 CAPSULE BY MOUTH EVERY DAY IN THE MORNING, Disp: 90 capsule, Rfl: 4  Social History   Tobacco Use  Smoking Status Never  Smokeless Tobacco Never    No Known Allergies Objective:  There were no vitals filed for this visit. There is no height or weight on file to calculate BMI. Constitutional Well developed. Well nourished.  Vascular Dorsalis pedis pulses palpable bilaterally. Posterior tibial pulses palpable bilaterally. Capillary refill normal to all digits.  No cyanosis or clubbing noted. Pedal hair growth normal.  Neurologic Normal speech. Oriented to person, place, and time. Protective sensation absent  Dermatologic Wound Location: Right plantar midfoot fat layer exposed Wound Base: Granular/Healthy Mixed Granular/Fibrotic Peri-wound: Normal Exudate: Scant/small amount Serous exudate Wound Measurements: -See above  Orthopedic: No pain to palpation either foot.   Radiographs: None Assessment:   1. Bony exostosis   2. Diabetic ulcer of right midfoot associated with type 2 diabetes mellitus, limited to breakdown of skin (HCC)   3. Charcot's joint of foot, right   4. S/P foot surgery    Plan:  Patient was evaluated and treated and all questions answered.  Right fifth metatarsal base exostectomy -Status post base exostectomy.  He is doing well clinically skin incisions well coapted.  No signs of infection  Ulcer right Charcot wound with fat layer exposed -Debridement as below. -Dressed with Betadine wet-to-dry, DSD. -Continue off-loading with surgical shoe.  Procedure:  Excisional Debridement of Wound Tool: Sharp chisel blade/tissue nipper Rationale: Removal of non-viable soft tissue from the wound to promote healing.  Anesthesia: none Pre-Debridement Wound Measurements: 1 cm x 0.9 cm x 0.3 cm  Post-Debridement Wound Measurements: 1.1 cm x 1 cm x 0.3 cm  Type of Debridement: Sharp Excisional Tissue Removed: Non-viable soft tissue Blood loss: Minimal (<50cc) Depth of Debridement: subcutaneous tissue. Technique: Sharp excisional debridement to bleeding, viable wound base.  Wound Progress: This is my initial evaluation of continue to monitor the progression of the wound given the surgery Site healing conversation 7 Dressing: Dry, sterile, compression dressing. Disposition: Patient tolerated procedure well. Patient to return in 1 week for follow-up.  No follow-ups on file.

## 2022-09-12 ENCOUNTER — Telehealth: Payer: Self-pay | Admitting: Family Medicine

## 2022-09-12 NOTE — Telephone Encounter (Signed)
Patient dropped off document disability ppw , to be filled out by provider. Patient requested to send it back via Call Patient to pick up within 5-days. Document is located in providers tray at front office.Please advise at Mobile [phone] (mobile)

## 2022-09-15 ENCOUNTER — Ambulatory Visit (HOSPITAL_BASED_OUTPATIENT_CLINIC_OR_DEPARTMENT_OTHER): Payer: BLUE CROSS/BLUE SHIELD | Admitting: General Surgery

## 2022-09-15 NOTE — Telephone Encounter (Signed)
Form in your to sign folder. 

## 2022-09-16 ENCOUNTER — Other Ambulatory Visit: Payer: Self-pay | Admitting: Family Medicine

## 2022-09-16 NOTE — Telephone Encounter (Signed)
I completed my portion- needs notes- but honestly a lot of this is dependent on foot issus and recommendations from podiatry- his medial illnesses are not what is leading to the disability. I essentially said if could be off his feet completely would be an option but would lean on podiatry as well

## 2022-09-18 NOTE — Telephone Encounter (Signed)
Pt notified paperwork completed and up front for pick up.

## 2022-09-24 ENCOUNTER — Ambulatory Visit (INDEPENDENT_AMBULATORY_CARE_PROVIDER_SITE_OTHER): Payer: BLUE CROSS/BLUE SHIELD | Admitting: Podiatry

## 2022-09-24 DIAGNOSIS — L97411 Non-pressure chronic ulcer of right heel and midfoot limited to breakdown of skin: Secondary | ICD-10-CM | POA: Diagnosis not present

## 2022-09-24 DIAGNOSIS — M898X9 Other specified disorders of bone, unspecified site: Secondary | ICD-10-CM

## 2022-09-24 DIAGNOSIS — E11621 Type 2 diabetes mellitus with foot ulcer: Secondary | ICD-10-CM

## 2022-09-24 NOTE — Progress Notes (Signed)
Subjective:  Patient ID: [name], male    DOB: February 24, 1970,  MRN: [id number]  Chief Complaint  Patient presents with   Routine Post Op    POV # 2 DOS 09/04/22 --- RIGHT FOOT EXCISION DEBRIDEMENT OF THE WOUND WITH EXOSTECOTOMY OF TARSAL BONES    52 y.o. male presents for wound care.  Patient presents with status post right excisional debridement of the wound with exostectomy of the fifth metatarsal base.  He states he is doing okay no pain noted.  Discomfort bandages clean dry and intact.   Review of Systems: Negative except as noted in the HPI. Denies N/V/F/Ch.  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   (06-17-2019 pt stated has been stable with no changes and followed by pcp)   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HCC)    11-06-2016  lower extremity angiography (dr Edilia Bo)  mil tibial disease w/ occlusion of the distal peroneal artery and dorsalis pedis artery but has widely patent posterior tibial and anterior tibial arterties   Type 2 diabetes mellitus treated with insulin (HCC)    followed by pcp   (06-17-2019  pt stated checks blood sugar daily in am,  fasting sugar-- average 71)   Vitamin B 12 deficiency     Current Outpatient Medications:    ascorbic acid (VITAMIN C) 500 MG tablet, Take 500 mg by mouth daily., Disp: , Rfl:    aspirin EC 81 MG tablet, Take 81 mg by mouth daily., Disp: , Rfl:    atorvastatin (LIPITOR) 80 MG tablet,  TAKE 1 TABLET BY MOUTH EVERY DAY, Disp: 90 tablet, Rfl: 3   Cyanocobalamin (VITAMIN B-12) 5000 MCG SUBL, Place 5,000 mcg under the tongue daily., Disp: , Rfl:    fenofibrate (TRICOR) 48 MG tablet, TAKE 1 TABLET BY MOUTH EVERY DAY (Patient taking differently: Take 48 mg by mouth daily.), Disp: 90 tablet, Rfl: 4   glucose blood (ACCU-CHEK GUIDE) test strip, Dx E11.49 - Use to check blood glucose three times daily or as directed., Disp: 300 each, Rfl: 1   hydrochlorothiazide (MICROZIDE) 12.5 MG capsule, TAKE 1 CAPSULE BY MOUTH EVERY DAY IN THE MORNING (Patient taking differently: Take 12.5 mg by mouth daily. TAKE 1 CAPSULE BY MOUTH EVERY DAY IN THE MORNING), Disp: 100 capsule, Rfl: 3   ibuprofen (ADVIL) 800 MG tablet, Take 1 tablet (800 mg total) by mouth every 6 (six) hours as needed., Disp: 60 tablet, Rfl: 1   insulin aspart (NOVOLOG FLEXPEN) 100 UNIT/ML FlexPen, INJECT 15-25 UNITS INTO THE SKIN 3 TIMES DAILY WITH MEALS. PLEASE PROVIDE PEN NEEDLES IF POSSIBLE, Disp: 15 mL, Rfl: 11   insulin degludec (TRESIBA FLEXTOUCH) 200 UNIT/ML FlexTouch Pen, INJECT 80-100 UNITS INTO THE SKIN DAILY., Disp: 9 mL, Rfl: 3   Insulin Pen Needle 31G X 8 MM MISC, Use to inject insulin daily. Dx: E11.9, Disp: 300 each, Rfl: 11  Insulin Syringe-Needle U-100 (BD INSULIN SYRINGE U/F) 31G X 5/16" 0.5 ML MISC, Use to inject insulin 4 times daily. DX:E11.9, Disp: 100 each, Rfl: 4   Insulin Syringe-Needle U-100 (BD VEO INSULIN SYR U/F 1/2UNIT) 31G X 15/64" 0.3 ML MISC, USE TO INJECT INSULIN FOUR TIMES DAILY AS DIRECTED, Disp: 300 each, Rfl: 2   JANUVIA 50 MG tablet, TAKE 1 TABLET BY MOUTH EVERY DAY IN THE MORNING, Disp: 90 tablet, Rfl: 1   Lancets (ONETOUCH ULTRASOFT) lancets, Use to test blood sugars daily. Dx: E11.9, Disp: 100 each, Rfl: 12   Lancets MISC, USED TO CHECK BLOOD GLUCOSE 3-4 TIMES DAILY, Disp: 200 each, Rfl: 3   Multiple Vitamin (MULTIVITAMIN WITH MINERALS) TABS tablet, Take 1 tablet by mouth 2 (two) times daily.,  Disp: , Rfl:    Omega-3 Fatty Acids (FISH OIL) 1360 MG CAPS, Take 1,360 mg by mouth daily. , Disp: , Rfl:    oxyCODONE-acetaminophen (PERCOCET) 5-325 MG tablet, Take 1 tablet by mouth every 4 (four) hours as needed for severe pain., Disp: 30 tablet, Rfl: 0   pyridoxine (B-6) 100 MG tablet, Take 100 mg by mouth 2 (two) times daily., Disp: , Rfl:    ramipril (ALTACE) 5 MG capsule, TAKE 1 CAPSULE BY MOUTH EVERY DAY IN THE MORNING, Disp: 90 capsule, Rfl: 4  Social History   Tobacco Use  Smoking Status Never  Smokeless Tobacco Never    No Known Allergies Objective:  There were no vitals filed for this visit. There is no height or weight on file to calculate BMI. Constitutional Well developed. Well nourished.  Vascular Dorsalis pedis pulses palpable bilaterally. Posterior tibial pulses palpable bilaterally. Capillary refill normal to all digits.  No cyanosis or clubbing noted. Pedal hair growth normal.  Neurologic Normal speech. Oriented to person, place, and time. Protective sensation absent  Dermatologic Wound Location: Right plantar midfoot fat layer exposed Wound Base: Granular/Healthy Mixed Granular/Fibrotic Peri-wound: Normal Exudate: Scant/small amount Serous exudate Wound Measurements: -See above  Orthopedic: No pain to palpation either foot.   Radiographs: None Assessment:   No diagnosis found.  Plan:  Patient was evaluated and treated and all questions answered.  Right fifth metatarsal base exostectomy -Status post base exostectomy.  He is doing well clinically skin incisions well coapted.  No signs of infection  Ulcer right Charcot wound with fat layer exposed -Debridement as below. -Dressed with Betadine wet-to-dry, DSD. -Continue off-loading with surgical shoe.  Procedure: Excisional Debridement of Wound Tool: Sharp chisel blade/tissue nipper Rationale: Removal of non-viable soft tissue from the wound to promote healing.  Anesthesia: none Pre-Debridement  Wound Measurements: 0.6 cm x 0.8 cm x 0.3 cm  Post-Debridement Wound Measurements: 0.7 cm x 0.9 cm x 0.3 cm  Type of Debridement: Sharp Excisional Tissue Removed: Non-viable soft tissue Blood loss: Minimal (<50cc) Depth of Debridement: subcutaneous tissue. Technique: Sharp excisional debridement to bleeding, viable wound base.  Wound Progress: Wound is decreasing in size slowly Site healing conversation 7 Dressing: Dry, sterile, compression dressing. Disposition: Patient tolerated procedure well. Patient to return in 1 week for follow-up.  No follow-ups on file.

## 2022-09-25 ENCOUNTER — Ambulatory Visit (INDEPENDENT_AMBULATORY_CARE_PROVIDER_SITE_OTHER): Payer: BLUE CROSS/BLUE SHIELD | Admitting: Family Medicine

## 2022-09-25 ENCOUNTER — Encounter: Payer: Self-pay | Admitting: Family Medicine

## 2022-09-25 ENCOUNTER — Other Ambulatory Visit: Payer: Self-pay | Admitting: Family Medicine

## 2022-09-25 VITALS — BP 120/81 | HR 75 | Temp 97.7°F | Ht 72.0 in | Wt 314.0 lb

## 2022-09-25 DIAGNOSIS — E11319 Type 2 diabetes mellitus with unspecified diabetic retinopathy without macular edema: Secondary | ICD-10-CM | POA: Diagnosis not present

## 2022-09-25 DIAGNOSIS — Z23 Encounter for immunization: Secondary | ICD-10-CM | POA: Diagnosis not present

## 2022-09-25 DIAGNOSIS — E538 Deficiency of other specified B group vitamins: Secondary | ICD-10-CM | POA: Diagnosis not present

## 2022-09-25 DIAGNOSIS — N183 Chronic kidney disease, stage 3 unspecified: Secondary | ICD-10-CM

## 2022-09-25 DIAGNOSIS — Z7984 Long term (current) use of oral hypoglycemic drugs: Secondary | ICD-10-CM

## 2022-09-25 DIAGNOSIS — Z0000 Encounter for general adult medical examination without abnormal findings: Secondary | ICD-10-CM | POA: Diagnosis not present

## 2022-09-25 DIAGNOSIS — E1142 Type 2 diabetes mellitus with diabetic polyneuropathy: Secondary | ICD-10-CM

## 2022-09-25 DIAGNOSIS — Z125 Encounter for screening for malignant neoplasm of prostate: Secondary | ICD-10-CM | POA: Diagnosis not present

## 2022-09-25 DIAGNOSIS — I10 Essential (primary) hypertension: Secondary | ICD-10-CM

## 2022-09-25 DIAGNOSIS — E1149 Type 2 diabetes mellitus with other diabetic neurological complication: Secondary | ICD-10-CM

## 2022-09-25 DIAGNOSIS — E785 Hyperlipidemia, unspecified: Secondary | ICD-10-CM | POA: Diagnosis not present

## 2022-09-25 DIAGNOSIS — D509 Iron deficiency anemia, unspecified: Secondary | ICD-10-CM

## 2022-09-25 DIAGNOSIS — Z89619 Acquired absence of unspecified leg above knee: Secondary | ICD-10-CM

## 2022-09-25 DIAGNOSIS — E1169 Type 2 diabetes mellitus with other specified complication: Secondary | ICD-10-CM | POA: Diagnosis not present

## 2022-09-25 DIAGNOSIS — Z794 Long term (current) use of insulin: Secondary | ICD-10-CM

## 2022-09-25 DIAGNOSIS — D352 Benign neoplasm of pituitary gland: Secondary | ICD-10-CM

## 2022-09-25 LAB — CBC WITH DIFFERENTIAL/PLATELET
Basophils Absolute: 0 10*3/uL (ref 0.0–0.1)
Basophils Relative: 0.6 % (ref 0.0–3.0)
Eosinophils Absolute: 0.2 10*3/uL (ref 0.0–0.7)
Eosinophils Relative: 3 % (ref 0.0–5.0)
HCT: 42.5 % (ref 39.0–52.0)
Hemoglobin: 13.7 g/dL (ref 13.0–17.0)
Lymphocytes Relative: 29.5 % (ref 12.0–46.0)
Lymphs Abs: 2.3 10*3/uL (ref 0.7–4.0)
MCHC: 32.3 g/dL (ref 30.0–36.0)
MCV: 79.5 fl (ref 78.0–100.0)
Monocytes Absolute: 0.3 10*3/uL (ref 0.1–1.0)
Monocytes Relative: 4.4 % (ref 3.0–12.0)
Neutro Abs: 4.9 10*3/uL (ref 1.4–7.7)
Neutrophils Relative %: 62.5 % (ref 43.0–77.0)
Platelets: 302 10*3/uL (ref 150.0–400.0)
RBC: 5.35 Mil/uL (ref 4.22–5.81)
RDW: 13.5 % (ref 11.5–15.5)
WBC: 7.8 10*3/uL (ref 4.0–10.5)

## 2022-09-25 LAB — MICROALBUMIN / CREATININE URINE RATIO
Creatinine,U: 168.6 mg/dL
Microalb Creat Ratio: 4.1 mg/g (ref 0.0–30.0)
Microalb, Ur: 6.9 mg/dL — ABNORMAL HIGH (ref 0.0–1.9)

## 2022-09-25 LAB — COMPREHENSIVE METABOLIC PANEL
ALT: 63 U/L — ABNORMAL HIGH (ref 0–53)
AST: 34 U/L (ref 0–37)
Albumin: 4.3 g/dL (ref 3.5–5.2)
Alkaline Phosphatase: 119 U/L — ABNORMAL HIGH (ref 39–117)
BUN: 21 mg/dL (ref 6–23)
CO2: 28 meq/L (ref 19–32)
Calcium: 10.2 mg/dL (ref 8.4–10.5)
Chloride: 100 meq/L (ref 96–112)
Creatinine, Ser: 1.32 mg/dL (ref 0.40–1.50)
GFR: 62.04 mL/min (ref >60.00)
Glucose, Bld: 145 mg/dL — ABNORMAL HIGH (ref 70–99)
Potassium: 4 meq/L (ref 3.5–5.1)
Sodium: 137 meq/L (ref 135–145)
Total Bilirubin: 1 mg/dL (ref 0.2–1.2)
Total Protein: 8.6 g/dL — ABNORMAL HIGH (ref 6.0–8.3)

## 2022-09-25 LAB — IBC + FERRITIN
Ferritin: 539.5 ng/mL — ABNORMAL HIGH (ref 22.0–322.0)
Iron: 83 ug/dL (ref 42–165)
Saturation Ratios: 27.2 % (ref 20.0–50.0)
TIBC: 305.2 ug/dL (ref 250.0–450.0)
Transferrin: 218 mg/dL (ref 212.0–360.0)

## 2022-09-25 LAB — PSA: PSA: 0.39 ng/mL (ref 0.10–4.00)

## 2022-09-25 LAB — VITAMIN B12: Vitamin B-12: 979 pg/mL — ABNORMAL HIGH (ref 211–911)

## 2022-09-25 LAB — LDL CHOLESTEROL, DIRECT: Direct LDL: 142 mg/dL

## 2022-09-25 LAB — HEMOGLOBIN A1C: Hgb A1c MFr Bld: 8.8 % — ABNORMAL HIGH (ref 4.6–6.5)

## 2022-09-25 MED ORDER — TRESIBA FLEXTOUCH 200 UNIT/ML ~~LOC~~ SOPN: 100.0000 [IU] | PEN_INJECTOR | Freq: Every day | SUBCUTANEOUS | 3 refills | Status: DC

## 2022-09-25 MED ORDER — TIRZEPATIDE 5 MG/0.5ML ~~LOC~~ SOAJ: 5.0000 mg | SUBCUTANEOUS | 1 refills | Status: DC

## 2022-09-25 MED ORDER — MOUNJARO 2.5 MG/0.5ML ~~LOC~~ SOAJ: 2.5000 mg | SUBCUTANEOUS | 0 refills | Status: DC

## 2022-09-25 NOTE — Addendum Note (Signed)
Addended byZoe Lan on: 09/25/2022 01:30 PM   Modules accepted: Orders

## 2022-09-25 NOTE — Patient Instructions (Addendum)
Let us know when you get your COVID vaccine.  diabetes sounds to be poorly controlled- we will update a1c but go ahead and change to Mounjaro 2.5 mg first month then 5 mg- we are hoping this also helps with weight loss to combat inactivity forced by foot issues -STOP januvia on day you start mounjaro  blood pressure typically well controlled but hair high today without medications in system- he is going to update me in a week with home readings at least 2 hours after meds  Please stop by lab before you go If you have mychart- we will send your results within 3 business days of [place] receiving them.  If you do not have mychart- we will call you about results within 5 business days of [place] receiving them.  *please also note that you will see labs on mychart as soon as they post. I will later go in and write notes on them- will say "notes from Dr. Durene Cal"   Recommended follow up: Return in about 4 months (around 01/25/2023) for followup or sooner if needed.Schedule b4 you leave.

## 2022-09-25 NOTE — Progress Notes (Signed)
Phone: [phone]   Subjective:  Patient presents today for their annual physical. Chief complaint-noted.   See problem oriented charting- ROS- full  review of systems was completed and negative  Per full ROS sheet completed by patient  The following were reviewed and entered/updated in epic: 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   (06-17-2019 pt stated has been stable with no changes and followed by pcp)   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HCC)    11-06-2016  lower extremity angiography (dr Edilia Bo)  mil tibial disease w/ occlusion of the distal peroneal artery and dorsalis pedis artery but has widely patent posterior tibial and anterior tibial arterties   Type 2 diabetes mellitus treated with insulin (HCC)    followed by pcp   (06-17-2019  pt stated checks blood sugar daily in am,  fasting sugar-- average 71)   Vitamin B 12 deficiency    Patient Active Problem List   Diagnosis Date Noted   Morbid obesity (HCC) 03/08/2015    Priority: High   Type II diabetes mellitus with neurological manifestations (HCC) 03/07/2015    Priority: High   Diabetic retinopathy (HCC) 01/25/2018    Priority: Medium    Diabetic neuropathy (HCC) 11/17/2016    Priority: Medium    Elevated LFTs     Priority: Medium    Hyperlipidemia associated with type 2 diabetes mellitus (HCC) 03/08/2015    Priority: Medium    Essential hypertension 03/08/2015    Priority:  Medium    Charcot's joint of foot, right 11/18/2019    Priority: Low   B12 deficiency 11/17/2016    Priority: Low   Iron deficiency anemia 11/17/2016    Priority: Low   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) 11/17/2016    Priority: Low   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 (HCC) 11/17/2016    Priority: Low   History of diabetes-related lower limb amputation (HCC) 09/25/2022   Melena and acute on chronic blood loss anemia 11/07/2021   Pituitary adenoma (HCC) 11/07/2021   PVD (peripheral vascular disease) (HCC) 11/07/2021   Symptomatic anemia 11/06/2021   Ulcer of right foot with necrosis of bone (HCC)    Osteomyelitis (HCC) 08/31/2020   Diabetic foot ulcer (HCC) 11/18/2019   Pyogenic inflammation of bone (HCC) 11/09/2019   Osteomyelitis of ankle or foot, acute, right (HCC) 11/09/2019   Open wound of right foot 05/22/2017   Diabetic foot infection (HCC) 07/29/2016   Type 2 diabetes mellitus with stage 3 chronic kidney disease, with long-term current use of insulin (HCC) 03/20/2015   Past Surgical History:  Procedure Laterality Date   AMPUTATION Right 07/31/2016   Procedure: AMPUTATION RIGHT FIFTH RAY AND APPLICATION OF WOUND VAC;  Surgeon: Jodi Geralds, MD;  Location: [facility];  Service: Orthopedics;  Laterality: Right;   AMPUTATION Right 08/08/2016  Procedure: RAY AMPUTATION RIGHT FOURTH METATARSAL, DEBRIDEMENT OF SUBQ TISSUE,BONE AND SKIN WITH WOUND CLOSURE;  Surgeon: Jodi Geralds, MD;  Location: [facility];  Service: Orthopedics;  Laterality: Right;   AMPUTATION TOE Left 06/21/2019   Procedure: AMPUTATION TOE SECOND LEFT, DEBRIDEMENT OF LEFT GREAT TOE;  Surgeon: Park Liter, DPM;  Location: [facility];  Service: Podiatry;  Laterality: Left;   AMPUTATION TOE Right 09/03/2020   Procedure: AMPUTATION TOE;  Surgeon: Park Liter, DPM;  [facility]: [facility];  Service: Podiatry;  Laterality: Right;   ANAL FISSURE  REPAIR  2000   APPLICATION OF A-CELL OF EXTREMITY Right 08/13/2016   Procedure: APPLICATION OF A-CELL AND VAC;  Surgeon: Peggye Form, DO;  Location: [facility];  Service: Plastics;  Laterality: Right;   APPLICATION OF A-CELL OF EXTREMITY Right 07/16/2017   Procedure: CELLERATE PLACEMENT;  Surgeon: Peggye Form, DO;  Location: [facility];  Service: Plastics;  Laterality: Right;   APPLICATION OF WOUND VAC Right 09/18/2016   Procedure: APPLICATION OF WOUND VAC (PT. WILL BRING HIS OWN);  Surgeon: Peggye Form, DO;  Location: [facility];  Service: Plastics;  Laterality: Right;   APPLICATION OF WOUND VAC Right 10/30/2016   Procedure: APPLICATION OF WOUND VAC;  Surgeon: Peggye Form, DO;  Location: [facility];  Service: Plastics;  Laterality: Right;   APPLICATION OF WOUND VAC Right 12/01/2016   Procedure: APPLICATION OF WOUND VAC;  Surgeon: Peggye Form, DO;  [facility];  Service: Plastics;  Laterality: Right;   BONE BIOPSY Left 01/04/2018   Procedure: LEFT FOOT BONE BIOPSY;  Surgeon: Park Liter, DPM;  [facility];  Service: Podiatry;  Laterality: Left;   BONE BIOPSY Right 09/03/2020   Procedure: BONE BIOPSY;  Surgeon: Park Liter, DPM;  [facility];  Service: Podiatry;  Laterality: Right;   CIRCUMCISION  2006   COLONOSCOPY  04/08/2021   DEBRIDEMENT  FOOT Right 07/16/2017   ESOPHAGOGASTRODUODENOSCOPY (EGD) WITH PROPOFOL N/A 11/07/2021   Procedure: ESOPHAGOGASTRODUODENOSCOPY (EGD) WITH PROPOFOL;  Surgeon: Shellia Cleverly, DO;  [facility];  Service: Gastroenterology;  Laterality: N/A;   GIVENS CAPSULE STUDY N/A 11/07/2021   Procedure: GIVENS CAPSULE STUDY;  Surgeon: Shellia Cleverly, DO;  [facility];  Service: Gastroenterology;  Laterality: N/A;   I & D EXTREMITY Right 09/18/2016   Procedure: IRRIGATION AND DEBRIDEMENT OF RIGHT LATERAL DIABLTIC FOOT ULCER;  Surgeon:  Peggye Form, DO;  Location: [facility];  Service: Plastics;  Laterality: Right;   I & D EXTREMITY Right 03/11/2017   Procedure: IRRIGATION AND DEBRIDEMENT OF RIGHT LATERAL FOOT WOUND WITH CELLERATE COLLAGEN;  Surgeon: Peggye Form, DO;  Location: [facility];  Service: Plastics;  Laterality: Right;   INCISION AND DRAINAGE OF WOUND Right 08/13/2016   Procedure: IRRIGATION AND DEBRIDEMENT WOUND RIGHT FOOT;  Surgeon: Peggye Form, DO;  [facility];  Service: Plastics;  Laterality: Right;   IR PATIENT EVAL TECH 0-60 MINS  01/12/2020   LAPAROSCOPIC CHOLECYSTECTOMY  09-09-2006   dr Abbey Chatters   LOWER EXTREMITY ANGIOGRAPHY N/A 10/27/2016   Procedure: Lower Extremity Angiography;  Surgeon: Chuck Hint, MD;  Location: [facility];  Service: Cardiovascular;  Laterality: N/A;   MASS EXCISION Right 10/30/2016   Procedure: EXCISION RIGHT FOOT WOUND WITH VAC PLACEMENT;  Surgeon: Peggye Form, DO;  Location: [facility];  Service: Plastics;  Laterality: Right;  MASS EXCISION Right 07/16/2017   Procedure: EXCISION OF RIGHT FOOT WOUND WITH CELLERATE PLACEMENT;  Surgeon: Peggye Form, DO;  Location: [facility];  Service: Plastics;  Laterality: Right;   METATARSAL HEAD EXCISION Left 01/04/2018   Procedure: 5TH METATARSAL RESECTION,  WOUND CLOSURE,  Adjacent Tissue Transfer.;  Surgeon: Park Liter, DPM;  Location[facility];  Service: Podiatry;  Laterality: Left;   SKIN SPLIT GRAFT Right 12/01/2016   Procedure: SKIN GRAFT SPLIT THICKNESS TO RIGHT LATERAL FOOT WOUND 9 X 3 CM;  Surgeon: Peggye Form, DO;  Location: [facility];  Service: Plastics;  Laterality: Right;   wisdom teeth extracted  1984   WOUND DEBRIDEMENT Right 09/03/2020   Procedure: DEBRIDEMENT WOUND, INSERTION OF ANTIBIOTIC BEADS;  Surgeon: Park Liter, DPM;  Location: [facility];  Service: Podiatry;  Laterality: Right;     Family History  Problem Relation Age of Onset   Sarcoidosis Mother    Stroke Mother    Stroke Father        early 36s. former smoker   Diabetes Father    Hypertension Father    Hypertension Sister    Diabetes Paternal Grandmother    Congenital heart disease Paternal Grandmother        died of complications   Healthy Daughter    Colon cancer Neg Hx    Colon polyps Neg Hx    Esophageal cancer Neg Hx    Rectal cancer Neg Hx    Stomach cancer Neg Hx     Medications- reviewed and updated Current Outpatient Medications  Medication Sig Dispense Refill   ascorbic acid (VITAMIN C) 500 MG tablet Take 500 mg by mouth daily.     aspirin EC 81 MG tablet Take 81 mg by mouth daily.     atorvastatin (LIPITOR) 80 MG tablet TAKE 1 TABLET BY MOUTH EVERY DAY 90 tablet 3   Cyanocobalamin (VITAMIN B-12) 5000 MCG SUBL Place 5,000 mcg under the tongue daily.     fenofibrate (TRICOR) 48 MG tablet TAKE 1 TABLET BY MOUTH EVERY DAY (Patient taking differently: Take 48 mg by mouth daily.) 90 tablet 4   glucose blood (ACCU-CHEK GUIDE) test strip Dx E11.49 - Use to check blood glucose three times daily or as directed. 300 each 1   hydrochlorothiazide (MICROZIDE) 12.5 MG capsule TAKE 1 CAPSULE BY MOUTH EVERY DAY IN THE MORNING (Patient taking differently: Take 12.5 mg by mouth daily. TAKE 1 CAPSULE BY MOUTH EVERY DAY IN THE MORNING) 100 capsule 3   ibuprofen (ADVIL) 800 MG tablet Take 1 tablet (800 mg total) by mouth every 6 (six) hours as needed. 60 tablet 1   insulin aspart (NOVOLOG FLEXPEN) 100 UNIT/ML FlexPen INJECT 15-25 UNITS INTO THE SKIN 3 TIMES DAILY WITH MEALS. PLEASE PROVIDE PEN NEEDLES IF POSSIBLE 15 mL 11   Insulin Pen Needle 31G X 8 MM MISC Use to inject insulin daily. Dx: E11.9 300 each 11   Insulin Syringe-Needle U-100 (BD INSULIN SYRINGE U/F) 31G X 5/16" 0.5 ML MISC Use to inject insulin 4 times daily. DX:E11.9 100 each 4   Insulin Syringe-Needle U-100 (BD VEO INSULIN SYR U/F 1/2UNIT) 31G X  15/64" 0.3 ML MISC USE TO INJECT INSULIN FOUR TIMES DAILY AS DIRECTED 300 each 2   Lancets (ONETOUCH ULTRASOFT) lancets Use to test blood sugars daily. Dx: E11.9 100 each 12   Lancets MISC USED TO CHECK BLOOD GLUCOSE 3-4 TIMES DAILY 200 each 3   Multiple Vitamin (MULTIVITAMIN WITH MINERALS)  TABS tablet Take 1 tablet by mouth 2 (two) times daily.     Omega-3 Fatty Acids (FISH OIL) 1360 MG CAPS Take 1,360 mg by mouth daily.      pyridoxine (B-6) 100 MG tablet Take 100 mg by mouth 2 (two) times daily.     ramipril (ALTACE) 5 MG capsule TAKE 1 CAPSULE BY MOUTH EVERY DAY IN THE MORNING 90 capsule 4   tirzepatide (MOUNJARO) 2.5 MG/0.5ML Pen Inject 2.5 mg into the skin once a week. FIRST MONTH ONLY then 5 mg dose 2 mL 0   tirzepatide (MOUNJARO) 5 MG/0.5ML Pen Inject 5 mg into the skin once a week. 6 mL 1   insulin degludec (TRESIBA FLEXTOUCH) 200 UNIT/ML FlexTouch Pen Inject 100 Units into the skin daily. 45 mL 3   oxyCODONE-acetaminophen (PERCOCET) 5-325 MG tablet Take 1 tablet by mouth every 4 (four) hours as needed for severe pain. (Patient not taking: Reported on 09/25/2022) 30 tablet 0   No current facility-administered medications for this visit.    Allergies-reviewed and updated No Known Allergies  Social History   Social History Narrative   Married. 42 year old daughter 10/2016.       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       Hobbies: drawing, archery, tinkering   Objective  Objective:  BP (!) 140/80   Pulse 75   Temp 97.7 F (36.5 C)   Ht 6' (1.829 m)   Wt (!) 314 lb (142.4 kg)   SpO2 97%   BMI 42.59 kg/m  Gen: NAD, resting comfortably HEENT: Mucous membranes are moist. Oropharynx normal Neck: no thyromegaly CV: RRR no murmurs rubs or gallops Lungs: CTAB no crackles,  wheeze, rhonchi Abdomen: soft/nontender/nondistended/normal bowel sounds. No rebound or guarding.  Ext: trace edema Skin: warm, dry Neuro: grossly normal, moves all extremities, PERRLA   Diabetic Foot Exam - Simple   Simple Foot Form Visual Inspection See comments: Yes Sensation Testing See comments: Yes Pulse Check Posterior Tibialis and Dorsalis pulse intact bilaterally: Yes Comments Amputation beyond distal interphalangeal\ on 2nd toe. Onychomycosis on remaining toenails. Large callous at MTP on 1st toe- he is following with podiatry for this.  We did not undress right foot- just had podiatry visit yesterday Monofilament barely perceptible on top of left foot but not at all on bottom.         Assessment and Plan  51 y.o. male presenting for annual physical.  Health Maintenance counseling: 1. Anticipatory guidance: Patient counseled regarding regular dental exams -q6 months, eye exams -yearly,  avoiding smoking and second hand smoke , limiting alcohol to 2 beverages per day-does not drink, no illicit drugs .   2. Risk factor reduction:  Advised patient of need for regular exercise and diet rich and fruits and vegetables to reduce risk of heart attack and stroke.  Exercise-once again limited by his wound issues-in last month has not been able to do any per instructions- hoping once released we can find a healthy way for him to pursue this to not put foot at risk. Has been out of work since February with foot issue but he would like to go back if possible Diet/weight management-morbid obesity noted with BMI over 40-discussed working on healthy eating to try to bring this down.  Wt Readings from Last 3 Encounters:  09/25/22 (!) 314 lb (142.4 kg)  04/21/22 (!) 309 lb (140.2 kg)  01/30/22 (!) 314 lb 6.4 oz (142.6 kg)  3. Immunizations/screenings/ancillary studies-flu shot today, COVID shot later with family Immunization History  Administered Date(s) Administered   Influenza, High Dose  Seasonal PF 10/06/2017   Influenza,inj,Quad PF,6+ Mos 11/17/2016, 10/15/2018, 11/24/2019, 10/31/2020, 11/08/2021, 09/25/2022   PFIZER Comirnaty(Gray Top)Covid-19 Tri-Sucrose Vaccine 12/18/2021   PFIZER(Purple Top)SARS-COV-2 Vaccination 04/16/2019, 05/09/2019, 02/14/2020   Pfizer Covid-19 Vaccine Bivalent Booster 56yrs & up 12/09/2020   Pneumococcal Polysaccharide-23 02/02/2017   Tdap 09/24/2007, 10/06/2017   Zoster Recombinant(Shingrix) 04/17/2020, 06/25/2020  4. Prostate cancer screening- low risk prior PSA trend-continue to update with labs.  Reassuring rectal exam in the past.  No nocturia. Lab Results  Component Value Date   PSA 0.63 05/15/2021   PSA 0.33 02/24/2020   PSA 0.46 10/15/2018   5. Colon cancer screening - April 08, 2021 with 10-year repeat 6. Skin cancer screening- lower risk due to melanin content . advised regular sunscreen use. Denies worrisome, changing, or new skin lesions.  7. Smoking associated screening (lung cancer screening, AAA screen 65-75, UA)-never smoker 8. STD screening -opts out as monogamous/only active with wife  Status of chronic or acute concerns   # Recurrent diabetic wounds-has required multiple surgeries and hyperbaric oxygen treatments and infectious disease involvement over the last few years.  I have recently filled out disability paperwork for him-approximately 3-year struggle at this point and certainly being on his feet affects this- he has been out since February as above but he is hoping to go back if at all possible -History of amputation of toes and rays on the right foot diabetes related- also with neuropathy (stable no medications) -Also with history of linezolid associated anemia with prior treatments and has been taking iron- we will check iron today   #Diabetes-typically with A1c at least under 7.5 S: Medication: Tresiba at night up to 100 units (having trouble with supply- we sent in 3 month today), NovoLog 25  units twice daily -Take  Januvia 50 mg in the morning.  CBGs- trending up with inactivity and variable. Lowest 95 and highs up to 275 (had ice cream night before) Exercise and diet- limited exercise as above Lab Results  Component Value Date   HGBA1C 8.4 (H) 04/21/2022   HGBA1C 7.5 (H) 11/11/2021   HGBA1C 6.8 (H) 05/15/2021  A/P: diabetes sounds to be poorly controlled- we will update a1c but go ahead and change to Mounjaro 2.5 mg first month then 5 mg- we are hoping this also helps with weight loss to combat inactivity forced by foot issues -also with history retinopathy neurological deficit(s) follow up with optho - last check in march and was told no retinopathy- great news  # Hyperlipidemia-Ideal LDL under 70-tolerate under 100 S:Medication: Atorvastatin 80 mg daily along with fenofibrate 48 mg - also prefers to take aspirin for primary prevention  Lab Results  Component Value Date   CHOL 176 04/21/2022   HDL 44.60 04/21/2022   LDLCALC 107 (H) 04/21/2022   LDLDIRECT 37.0 11/11/2021   TRIG 126.0 04/21/2022   CHOLHDL 4 04/21/2022  A/P: hopefully improved- update LDL  today. Continue current meds for now    #Hypertension S: HCTZ 12.5 mg and Ramipril 5 mg - but hasn't taken medications yet today  BP Readings from Last 3 Encounters:  09/25/22 (!) 140/80  04/21/22 132/82  01/30/22 131/84  A/P: blood pressure typically well controlled but hair high today without medications in  system- he is going to update me in a week with home readings at least 2 hours after meds  # CKD stage III-GFR 85 on last check with some variability-continue to monitor -Protein slightly high last time as well as calcium-rechecking today-had offered 2-week repeat from April but he opted out  #B12 deficiency/anemia S Current treatment/medication(oral vs IM): Cyanocobalamin 5000 mcg daily. Also was takes ferrous sulfate supplements twice daily.  Lab Results  Component Value Date   VITAMINB12 773 11/06/2021  A/P: hopefully stable-  update b12 today. Continue current meds for now    #with prior amputations and onychomycosis- want to treat with terbinafine but check labs first- if labs reassuring we can send this in   #pituitary adenoma noted years and years ago with fertility workup- prolactin was high- we opted to recheck this prolactin level today- could consider updating MRI brain to make sure no change- though has been 20 years and no issues and no symptoms  Recommended follow up: Return in about 4 months (around 01/25/2023) for followup or sooner if needed.Schedule b4 you leave. Future Appointments  Date Time Provider Department Center  10/17/2022  9:15 AM Candelaria Stagers, DPM [facility]  10/20/2022 10:00 AM Duanne Guess, MD [facility]    Lab/Order associations: fasting   ICD-10-CM   1. Preventative health care  Z00.00     2. Need for vaccination for H flu type B  Z23 Flu Vaccine QUAD 6+ mos PF IM (Fluarix Quad PF)    3. Type II diabetes mellitus with neurological manifestations (HCC)  E11.49     4. Morbid obesity (HCC)  E66.01     5. Hyperlipidemia associated with type 2 diabetes mellitus (HCC)  E11.69    E78.5     6. Essential hypertension  I10     7. Diabetic polyneuropathy associated with type 2 diabetes mellitus (HCC)  E11.42     8. Diabetic retinopathy of both eyes associated with type 2 diabetes mellitus, macular edema presence unspecified, unspecified retinopathy severity (HCC)  E11.319     9. B12 deficiency  E53.8     10. Iron deficiency anemia, unspecified iron deficiency anemia type  D50.9     11. History of diabetes-related lower limb amputation (HCC)  Z89.619    E11.69     12. Stage 3 chronic kidney disease, unspecified whether stage 3a or 3b CKD (HCC) Chronic N18.30       Meds ordered this encounter  Medications   insulin degludec (TRESIBA FLEXTOUCH) 200 UNIT/ML FlexTouch Pen    Sig: Inject 100 Units into the skin daily.    Dispense:  45 mL    Refill:  3    tirzepatide (MOUNJARO) 2.5 MG/0.5ML Pen    Sig: Inject 2.5 mg into the skin once a week. FIRST MONTH ONLY then 5 mg dose    Dispense:  2 mL    Refill:  0   tirzepatide (MOUNJARO) 5 MG/0.5ML Pen    Sig: Inject 5 mg into the skin once a week.    Dispense:  6 mL    Refill:  1    Return precautions advised.  Tana Conch, MD

## 2022-09-26 LAB — PROLACTIN: Prolactin: 8.1 ng/mL (ref 2.0–18.0)

## 2022-09-29 ENCOUNTER — Ambulatory Visit (HOSPITAL_BASED_OUTPATIENT_CLINIC_OR_DEPARTMENT_OTHER): Payer: BLUE CROSS/BLUE SHIELD | Admitting: General Surgery

## 2022-10-01 ENCOUNTER — Encounter: Payer: Self-pay | Admitting: Pharmacist

## 2022-10-01 NOTE — Telephone Encounter (Signed)
Can you ask pharmacy if they have other GLP-1 agonist at starter dose - or does [facility] have this? cc Tammy Eckard for her opinion

## 2022-10-01 NOTE — Telephone Encounter (Signed)
See below, can you help with this?

## 2022-10-01 NOTE — Progress Notes (Unsigned)
   10/01/2022 Name: [name] MRN: [id number] DOB: 1970/10/27  No chief complaint on file.   {Visit XBJY:78295}   Subjective:  Care Team: Primary Care Provider: Shelva Majestic, MD ; Next Scheduled Visit: *** {careteamprovider:27366}  Medication Access/Adherence  Current Pharmacy:  [id number] Lorenza Evangelist, [place] - [address] [place] [address] Phone: [phone] Fax: [phone]   Patient reports affordability concerns with their medications: {YES/NO:21197} Patient reports access/transportation concerns to their pharmacy: {YES/NO:21197} Patient reports adherence concerns with their medications:  {YES/NO:21197} ***   {Pharmacy S/O Choices:26420}   Objective:  Lab Results  Component Value Date   HGBA1C 8.8 (H) 09/25/2022    Lab Results  Component Value Date   CREATININE 1.32 09/25/2022   BUN 21 09/25/2022   NA 137 09/25/2022   K 4.0 09/25/2022   CL 100 09/25/2022   CO2 28 09/25/2022    Lab Results  Component Value Date   CHOL 176 04/21/2022   HDL 44.60 04/21/2022   LDLCALC 107 (H) 04/21/2022   LDLDIRECT 142.0 09/25/2022   TRIG 126.0 04/21/2022   CHOLHDL 4 04/21/2022    Medications Reviewed Today   Medications were not reviewed in this encounter       Assessment/Plan:   {Pharmacy A/P Choices:26421}  Follow Up Plan: ***  ***

## 2022-10-01 NOTE — Telephone Encounter (Signed)
See pharmacy note below

## 2022-10-02 NOTE — Telephone Encounter (Signed)
I spoke to [name] [facility] not [facility] Mounjaro 2.5mg  in stock but they were able to find a 1 month supply.  He received it yesterday.  There is [facility]mg  [facility] but [facility] is planning to have transferred to [facility] for next month.  I provided him my phone number to call me if he has any issues next month. Also recommended he request Mounjaro 5mg  the day that he takes the last 2.5mg  so it gives [place] 7 days to locate product or do prior authorization if needed.   Prescription for Ozempic no longer needed.

## 2022-10-03 ENCOUNTER — Encounter: Payer: Self-pay | Admitting: Family Medicine

## 2022-10-12 ENCOUNTER — Other Ambulatory Visit: Payer: Self-pay | Admitting: Family Medicine

## 2022-10-15 ENCOUNTER — Encounter: Payer: BLUE CROSS/BLUE SHIELD | Admitting: Podiatry

## 2022-10-17 ENCOUNTER — Encounter: Payer: Self-pay | Admitting: Podiatry

## 2022-10-17 ENCOUNTER — Ambulatory Visit (INDEPENDENT_AMBULATORY_CARE_PROVIDER_SITE_OTHER): Payer: BLUE CROSS/BLUE SHIELD | Admitting: Podiatry

## 2022-10-17 DIAGNOSIS — E11621 Type 2 diabetes mellitus with foot ulcer: Secondary | ICD-10-CM | POA: Diagnosis not present

## 2022-10-17 DIAGNOSIS — M898X9 Other specified disorders of bone, unspecified site: Secondary | ICD-10-CM

## 2022-10-17 DIAGNOSIS — L97411 Non-pressure chronic ulcer of right heel and midfoot limited to breakdown of skin: Secondary | ICD-10-CM

## 2022-10-17 NOTE — Progress Notes (Signed)
Subjective:  Patient ID: [name], male    DOB: [birth year],  MRN: [id number]  Chief Complaint  Patient presents with   Routine Post Op    POV # 3 DOS 09/04/22 --- RIGHT FOOT EXCISION DEBRIDEMENT OF THE WOUND WITH EXOSTECOTOMY OF TARSAL BONES    52 y.o. male presents for wound care.  Patient presents with complaint of status post excisional debridement with exostectomy of the fifth metatarsal base.  Patient states that he is doing well denies any other acute complaints.   Review of Systems: Negative except as noted in the HPI. Denies N/V/F/Ch.  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   (06-17-2019 pt stated has been stable with no changes and followed by pcp)   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HCC)    11-06-2016  lower extremity angiography (dr Edilia Bo)  mil tibial disease w/ occlusion of the distal peroneal artery and dorsalis pedis artery but has widely patent posterior tibial and anterior tibial arterties   Type 2 diabetes mellitus treated with insulin (HCC)    followed by pcp   (06-17-2019  pt stated checks blood sugar daily in am,  fasting sugar-- average 71)   Vitamin B 12 deficiency     Current Outpatient Medications:    ascorbic acid (VITAMIN C) 500 MG tablet, Take 500 mg by mouth daily., Disp: , Rfl:    aspirin EC 81 MG tablet, Take 81 mg by mouth daily., Disp: , Rfl:    atorvastatin (LIPITOR) 80 MG tablet, TAKE 1 TABLET BY  MOUTH EVERY DAY, Disp: 90 tablet, Rfl: 3   Cyanocobalamin (VITAMIN B-12) 5000 MCG SUBL, Place 5,000 mcg under the tongue daily., Disp: , Rfl:    fenofibrate (TRICOR) 48 MG tablet, TAKE 1 TABLET BY MOUTH EVERY DAY, Disp: 90 tablet, Rfl: 4   glucose blood (ACCU-CHEK GUIDE) test strip, Dx E11.49 - Use to check blood glucose three times daily or as directed., Disp: 300 each, Rfl: 1   hydrochlorothiazide (MICROZIDE) 12.5 MG capsule, TAKE 1 CAPSULE BY MOUTH EVERY DAY IN THE MORNING (Patient taking differently: Take 12.5 mg by mouth daily. TAKE 1 CAPSULE BY MOUTH EVERY DAY IN THE MORNING), Disp: 100 capsule, Rfl: 3   ibuprofen (ADVIL) 800 MG tablet, Take 1 tablet (800 mg total) by mouth every 6 (six) hours as needed., Disp: 60 tablet, Rfl: 1   insulin aspart (NOVOLOG FLEXPEN) 100 UNIT/ML FlexPen, INJECT 15-25 UNITS INTO THE SKIN 3 TIMES DAILY WITH MEALS. PLEASE PROVIDE PEN NEEDLES IF POSSIBLE, Disp: 15 mL, Rfl: 11   insulin degludec (TRESIBA FLEXTOUCH) 200 UNIT/ML FlexTouch Pen, Inject 100 Units into the skin daily., Disp: 45 mL, Rfl: 3   Insulin Pen Needle 31G X 8 MM MISC, Use to inject insulin daily. Dx: E11.9, Disp: 300 each, Rfl: 11   Insulin Syringe-Needle U-100 (BD INSULIN SYRINGE U/F) 31G X 5/16" 0.5 ML MISC,  Use to inject insulin 4 times daily. DX:E11.9, Disp: 100 each, Rfl: 4   Insulin Syringe-Needle U-100 (BD VEO INSULIN SYR U/F 1/2UNIT) 31G X 15/64" 0.3 ML MISC, USE TO INJECT INSULIN FOUR TIMES DAILY AS DIRECTED, Disp: 300 each, Rfl: 2   Lancets (ONETOUCH ULTRASOFT) lancets, Use to test blood sugars daily. Dx: E11.9, Disp: 100 each, Rfl: 12   Lancets MISC, USED TO CHECK BLOOD GLUCOSE 3-4 TIMES DAILY, Disp: 200 each, Rfl: 3   Multiple Vitamin (MULTIVITAMIN WITH MINERALS) TABS tablet, Take 1 tablet by mouth 2 (two) times daily., Disp: , Rfl:    Omega-3 Fatty Acids (FISH OIL) 1360 MG CAPS, Take 1,360 mg by mouth daily. , Disp: , Rfl:    oxyCODONE-acetaminophen (PERCOCET) 5-325 MG tablet, Take 1  tablet by mouth every 4 (four) hours as needed for severe pain. (Patient not taking: Reported on 09/25/2022), Disp: 30 tablet, Rfl: 0   pyridoxine (B-6) 100 MG tablet, Take 100 mg by mouth 2 (two) times daily., Disp: , Rfl:    ramipril (ALTACE) 5 MG capsule, TAKE 1 CAPSULE BY MOUTH EVERY DAY IN THE MORNING, Disp: 90 capsule, Rfl: 4   tirzepatide (MOUNJARO) 2.5 MG/0.5ML Pen, Inject 2.5 mg into the skin once a week. FIRST MONTH ONLY then 5 mg dose, Disp: 2 mL, Rfl: 0   tirzepatide (MOUNJARO) 5 MG/0.5ML Pen, Inject 5 mg into the skin once a week., Disp: 6 mL, Rfl: 1  Social History   Tobacco Use  Smoking Status Never  Smokeless Tobacco Never    No Known Allergies Objective:  There were no vitals filed for this visit. There is no height or weight on file to calculate BMI. Constitutional Well developed. Well nourished.  Vascular Dorsalis pedis pulses palpable bilaterally. Posterior tibial pulses palpable bilaterally. Capillary refill normal to all digits.  No cyanosis or clubbing noted. Pedal hair growth normal.  Neurologic Normal speech. Oriented to person, place, and time. Protective sensation absent  Dermatologic Submet 5 ulceration completely epithelialized.  No signs of dehiscence noted no complication noted.  Skin has skin incision has reepithelialized.  Orthopedic: No pain to palpation either foot.   Radiographs: None Assessment:   No diagnosis found.  Plan:  Patient was evaluated and treated and all questions answered.  Right fifth metatarsal base exostectomy -Status post base exostectomy.  He is doing well clinically skin incisions well coapted.  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  No further wound noted.  Hyperkeratotic skin noted which appears to be decreasing.  No follow-ups on file.

## 2022-10-20 ENCOUNTER — Encounter (HOSPITAL_BASED_OUTPATIENT_CLINIC_OR_DEPARTMENT_OTHER): Payer: BLUE CROSS/BLUE SHIELD | Attending: General Surgery | Admitting: General Surgery

## 2022-10-20 DIAGNOSIS — L84 Corns and callosities: Secondary | ICD-10-CM | POA: Insufficient documentation

## 2022-10-20 DIAGNOSIS — L97515 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 without evidence of necrosis: Secondary | ICD-10-CM | POA: Diagnosis not present

## 2022-10-20 DIAGNOSIS — E1149 Type 2 diabetes mellitus with other diabetic neurological complication: Secondary | ICD-10-CM | POA: Diagnosis not present

## 2022-10-20 DIAGNOSIS — I10 Essential (primary) hypertension: Secondary | ICD-10-CM | POA: Diagnosis not present

## 2022-10-20 DIAGNOSIS — M86671 Other chronic osteomyelitis, right ankle and foot: Secondary | ICD-10-CM | POA: Diagnosis not present

## 2022-10-20 DIAGNOSIS — E11622 Type 2 diabetes mellitus with other skin ulcer: Secondary | ICD-10-CM | POA: Diagnosis not present

## 2022-10-20 DIAGNOSIS — E11621 Type 2 diabetes mellitus with foot ulcer: Secondary | ICD-10-CM | POA: Diagnosis present

## 2022-10-20 NOTE — Progress Notes (Signed)
Dressing, 2x2 (in/in) 1 x Per Day/30 Days ary Discharge Instructions: Apply to wound bed as instructed Secondary Dressing: Woven Gauze Sponge, Non-Sterile 4x4 in 1 x Per Day/30 Days Discharge  Instructions: Apply over primary dressing as directed. Secondary Dressing: Zetuvit Plus 4x8 in 1 x Per Day/30 Days Discharge Instructions: Apply over primary dressing as directed. Secured With: Elastic Bandage 4 inch (ACE bandage) 1 x Per Day/30 Days Discharge Instructions: Secure with ACE bandage as directed. Secured With: [employer], 4.5x3.1 (in/yd) 1 x Per Day/30 Days Discharge Instructions: Secure with Kerlix as directed. Secured With: Paper Tape, 2x10 (in/yd) (Generic) 1 x Per Day/30 Days Discharge Instructions: Secure dressing with tape as directed. Patient Medications llergies: No Known Allergies A Notifications Medication Indication Start End 10/20/2022 lidocaine DOSE topical 5 % ointment - ointment topical once daily Electronic Signature(s) Signed: 10/20/2022 10:48:03 AM By: Duanne Guess MD FACS Entered By: Duanne Guess on 10/20/2022 10:44:51 -------------------------------------------------------------------------------- Problem List Details Patient Name: Date of Service: [name], [name] 10/20/2022 10:00 [name] Medical Record Number: [id number] Patient Account Number: [id number] Date of Birth/Sex: Treating RN: [birth year] (52 y.o. 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 07/04/2021 No Yes M86.671 Other chronic osteomyelitis, right ankle and foot 07/04/2021 No  Yes Inactive Problems Resolved Problems ICD-10 Code Description Active Date Resolved Date L97.514 Non-pressure chronic ulcer of other part of right foot with necrosis of bone 07/04/2021 07/04/2021 Electronic Signature(s) Signed: 10/20/2022 10:38:44 AM By: Duanne Guess MD FACS Previous Signature: 10/20/2022 10:37:47 AM Version By: Duanne Guess MD FACS Entered By: Duanne Guess on 10/20/2022 10:38:44 -------------------------------------------------------------------------------- Progress Note Details Patient Name: Date of Service: [name], [name] 10/20/2022 10:00 [name] Medical Record Number: [id number] Patient Account Number: [id number] Date of Birth/Sex: Treating RN: 12-26-1970 (52 y.o. 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 The most recent amputation was in August 2022. At that operation, antibiotic beads were placed in the wound. He has been managed by podiatry for his procedures and management of his wounds. He has been in a Scientist, water quality. He is on oral doxycycline. They have been using Betadine wet to dry dressings along with Iodosorb. The patient states that when he thinks the wound is getting too dry, he applies topical Neosporin. At his last visit, on June 7 of this year, the podiatrist determined that he felt the wound was stalled and referred him to wound care for additional evaluation and management. An MRI has been ordered, but not yet scheduled or performed. Pathology from his operation in  August 2022 demonstrated findings consistent with chronic osteomyelitis. Today, there is a large irregular wound on the plantar  Positive for: Type II Diabetes Immunizations Pneumococcal Vaccine: Received Pneumococcal Vaccination: Yes Received Pneumococcal Vaccination On or After 60th Birthday: No Implantable Devices None Hospitalization / Surgery History Type of Hospitalization/Surgery Amuptation of 3rd,4th and 5th toes of Right foot;Oral Surgery;Anal Fissure surgery; Cholecystectomy Family and Social History Never smoker; Marital Status - Married; Alcohol Use: Never; Drug Use: No History; Caffeine Use: Daily - T coffee; Financial Concerns: No; Food, ea; [name] ([id number]) [id number] Page 12 of 12 Clothing or Shelter Needs: No; Support System Lacking: No; Transportation Concerns: No Psychologist, prison and probation services) Signed: 10/20/2022 10:48:03 AM By: Duanne Guess MD FACS Entered By: Duanne Guess on 10/20/2022  10:42:00 -------------------------------------------------------------------------------- SuperBill Details Patient Name: Date of Service: [name], [name] 10/20/2022 Medical Record Number: [id number] Patient Account Number: [id number] Date of Birth/Sex: Treating RN: 14-Mar-1970 (52 y.o. 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, right ankle and foot Facility Procedures : CPT4 Code: 01027253 Description: 97597 - DEBRIDE WOUND 1ST 20 SQ CM OR < ICD-10 Diagnosis Description 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 wit Modifier: hout evidence of n Quantity: 1 ecrosis : CPT4 Code: [id number] Description: 11055 - PARE BENIGN LES; SGL ICD-10 Diagnosis Description L84 Corns and callosities Modifier: Quantity: 1 Physician Procedures : CPT4 Code Description Modifier [id number] 99213 - WC PHYS LEVEL 3 - EST PT 25 ICD-10 Diagnosis Description 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 E11.621 Type 2 diabetes mellitus with  foot ulcer L84 Corns and callosities 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 Secondary Dressing: Zetuvit Plus 4x8 in 1 x Per Day/30 Days Discharge Instructions: Apply over primary dressing as directed. Secured With: Elastic Bandage 4 inch (ACE bandage) 1 x Per Day/30 Days Discharge Instructions: Secure with ACE bandage as directed. Secured With: [employer], 4.5x3.1 (in/yd) 1 x Per Day/30 Days Discharge Instructions: Secure with Kerlix as directed. Secured With: Paper Tape, 2x10 (in/yd) (Generic) 1 x Per Day/30 Days Discharge Instructions: Secure dressing with tape as directed. Patient Medications llergies: No Known Allergies A Notifications Medication Indication Start End 10/20/2022 lidocaine DOSE topical 5 % ointment - ointment topical once daily Electronic Signature(s) Signed: 10/20/2022 10:48:03 AM By: Duanne Guess MD FACS Entered By: Duanne Guess on 10/20/2022 10:44:51 -------------------------------------------------------------------------------- Problem List Details Patient Name: Date of Service: [name], [name] 10/20/2022 10:00 [name] Medical Record Number: [id number] Patient Account Number: [id number] Date of Birth/Sex: Treating RN: [birth year] (52 y.o. 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 07/04/2021 No Yes M86.671 Other chronic osteomyelitis, right ankle and foot 07/04/2021 No  Yes Inactive Problems Resolved Problems ICD-10 Code Description Active Date Resolved Date L97.514 Non-pressure chronic ulcer of other part of right foot with necrosis of bone 07/04/2021 07/04/2021 Electronic Signature(s) Signed: 10/20/2022 10:38:44 AM By: Duanne Guess MD FACS Previous Signature: 10/20/2022 10:37:47 AM Version By: Duanne Guess MD FACS Entered By: Duanne Guess on 10/20/2022 10:38:44 -------------------------------------------------------------------------------- Progress Note Details Patient Name: Date of Service: [name], [name] 10/20/2022 10:00 [name] Medical Record Number: [id number] Patient Account Number: [id number] Date of Birth/Sex: Treating RN: 12-26-1970 (52 y.o. 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 The most recent amputation was in August 2022. At that operation, antibiotic beads were placed in the wound. He has been managed by podiatry for his procedures and management of his wounds. He has been in a Scientist, water quality. He is on oral doxycycline. They have been using Betadine wet to dry dressings along with Iodosorb. The patient states that when he thinks the wound is getting too dry, he applies topical Neosporin. At his last visit, on June 7 of this year, the podiatrist determined that he felt the wound was stalled and referred him to wound care for additional evaluation and management. An MRI has been ordered, but not yet scheduled or performed. Pathology from his operation in  August 2022 demonstrated findings consistent with chronic osteomyelitis. Today, there is a large irregular wound on the plantar  clinically healed. [name] felt like he wanted to have another evaluation in our clinic to make sure that I agreed; he does have an additional follow-up scheduled in Dr. Eliane Decree office on October 25. Electronic Signature(s) Signed: 10/20/2022 10:41:32 AM By: Duanne Guess MD FACS Entered By: Duanne Guess on 10/20/2022 10:41:32 -------------------------------------------------------------------------------- Paring/cutting 1 benign hyperkeratotic lesion Details Patient Name: Date of Service: [name], [name] 10/20/2022 10:00 [name] Medical Record Number: [id number] Patient Account Number: [id number] Date of Birth/Sex:  Treating RN: [birth year] (52 y.o. Cline Cools Primary Care Provider: Tana Conch Other Clinician: Referring Provider: Treating Provider/Extender: Arvin Collard in Treatment: 67 Procedure Performed for: Non-Wound Location Performed By: Physician Duanne Guess, MD The following information was scribed by: Redmond Pulling The information was scribed for: Duanne Guess Post Procedure Diagnosis Same as Pre-procedure Electronic Signature(s) Signed: 10/20/2022 10:48:03 AM By: Duanne Guess MD FACS Signed: 10/20/2022 3:18:38 PM By: Redmond Pulling RN, BSN [name] ([id number]) [id number] Page 5 of 12 Entered By: Redmond Pulling on 10/20/2022 10:34:58 -------------------------------------------------------------------------------- Physical Exam Details Patient Name: Date of Service: [name], [name] 10/20/2022 10:00 [name] Medical Record Number: [id number] Patient Account Number: [id number] Date of Birth/Sex: Treating RN: 10-25-1970 (52 y.o. [name] Primary Care Provider: Tana Conch Other Clinician: Referring Provider: Treating Provider/Extender: Arvin Collard in Treatment: 12 Constitutional Hypertensive, asymptomatic. . . . no acute distress. Respiratory Normal work of breathing on room air. Notes 10/20/2022: The wound on the dorsal lateral aspect of his foot appears to be completely closed with just a little bit of crust around the edges. On the plantar foot, however, he has extensive thick callus. This was debrided to expose the wound site. There is a tiny superficial opening present but it does extend to the fat layer. Electronic Signature(s) Signed: 10/20/2022 10:44:35 AM By: Duanne Guess MD FACS Entered By: Duanne Guess on 10/20/2022 10:44:34 -------------------------------------------------------------------------------- Physician Orders Details Patient Name: Date of  Service: [name], [name] 10/20/2022 10:00 [name] Medical Record Number: [id number] Patient Account Number: [id number] Date of Birth/Sex: Treating RN: January 08, 1971 (52 y.o. 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 - Dr. Lady Gary - room 2 Monday 10/27/22 @ 0915 Return A Anesthetic (In clinic) Topical Lidocaine 5% applied to wound bed Bathing/ Shower/ Hygiene May shower and wash wound with soap and water. Additional Orders / Instructions Follow Nutritious Diet - incorporate protein shakes Other: - vitamin C 500 mg 3 times a day and zinc 30-50 mg a day [name] ([id number]) [phone].pdf Page 6 of 12 Juven Shake 1-2 times daily. Wound Treatment Wound #4 - Foot Wound Laterality: Right, Lateral, Distal Cleanser: Soap and Water 1 x Per Day/30 Days Discharge Instructions: May shower and wash wound with dial antibacterial soap and water prior to dressing change. Cleanser: Wound Cleanser (Generic) 1 x Per Day/30 Days Discharge Instructions: Cleanse the wound with wound cleanser prior to applying a clean dressing using gauze sponges, not tissue or cotton balls. Prim Dressing: Maxorb Extra Ag+ Alginate  clinically healed. [name] felt like he wanted to have another evaluation in our clinic to make sure that I agreed; he does have an additional follow-up scheduled in Dr. Eliane Decree office on October 25. Electronic Signature(s) Signed: 10/20/2022 10:41:32 AM By: Duanne Guess MD FACS Entered By: Duanne Guess on 10/20/2022 10:41:32 -------------------------------------------------------------------------------- Paring/cutting 1 benign hyperkeratotic lesion Details Patient Name: Date of Service: [name], [name] 10/20/2022 10:00 [name] Medical Record Number: [id number] Patient Account Number: [id number] Date of Birth/Sex:  Treating RN: [birth year] (52 y.o. Cline Cools Primary Care Provider: Tana Conch Other Clinician: Referring Provider: Treating Provider/Extender: Arvin Collard in Treatment: 67 Procedure Performed for: Non-Wound Location Performed By: Physician Duanne Guess, MD The following information was scribed by: Redmond Pulling The information was scribed for: Duanne Guess Post Procedure Diagnosis Same as Pre-procedure Electronic Signature(s) Signed: 10/20/2022 10:48:03 AM By: Duanne Guess MD FACS Signed: 10/20/2022 3:18:38 PM By: Redmond Pulling RN, BSN [name] ([id number]) [id number] Page 5 of 12 Entered By: Redmond Pulling on 10/20/2022 10:34:58 -------------------------------------------------------------------------------- Physical Exam Details Patient Name: Date of Service: [name], [name] 10/20/2022 10:00 [name] Medical Record Number: [id number] Patient Account Number: [id number] Date of Birth/Sex: Treating RN: 10-25-1970 (52 y.o. [name] Primary Care Provider: Tana Conch Other Clinician: Referring Provider: Treating Provider/Extender: Arvin Collard in Treatment: 12 Constitutional Hypertensive, asymptomatic. . . . no acute distress. Respiratory Normal work of breathing on room air. Notes 10/20/2022: The wound on the dorsal lateral aspect of his foot appears to be completely closed with just a little bit of crust around the edges. On the plantar foot, however, he has extensive thick callus. This was debrided to expose the wound site. There is a tiny superficial opening present but it does extend to the fat layer. Electronic Signature(s) Signed: 10/20/2022 10:44:35 AM By: Duanne Guess MD FACS Entered By: Duanne Guess on 10/20/2022 10:44:34 -------------------------------------------------------------------------------- Physician Orders Details Patient Name: Date of  Service: [name], [name] 10/20/2022 10:00 [name] Medical Record Number: [id number] Patient Account Number: [id number] Date of Birth/Sex: Treating RN: January 08, 1971 (52 y.o. 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 - Dr. Lady Gary - room 2 Monday 10/27/22 @ 0915 Return A Anesthetic (In clinic) Topical Lidocaine 5% applied to wound bed Bathing/ Shower/ Hygiene May shower and wash wound with soap and water. Additional Orders / Instructions Follow Nutritious Diet - incorporate protein shakes Other: - vitamin C 500 mg 3 times a day and zinc 30-50 mg a day [name] ([id number]) [phone].pdf Page 6 of 12 Juven Shake 1-2 times daily. Wound Treatment Wound #4 - Foot Wound Laterality: Right, Lateral, Distal Cleanser: Soap and Water 1 x Per Day/30 Days Discharge Instructions: May shower and wash wound with dial antibacterial soap and water prior to dressing change. Cleanser: Wound Cleanser (Generic) 1 x Per Day/30 Days Discharge Instructions: Cleanse the wound with wound cleanser prior to applying a clean dressing using gauze sponges, not tissue or cotton balls. Prim Dressing: Maxorb Extra Ag+ Alginate  Positive for: Type II Diabetes Immunizations Pneumococcal Vaccine: Received Pneumococcal Vaccination: Yes Received Pneumococcal Vaccination On or After 60th Birthday: No Implantable Devices None Hospitalization / Surgery History Type of Hospitalization/Surgery Amuptation of 3rd,4th and 5th toes of Right foot;Oral Surgery;Anal Fissure surgery; Cholecystectomy Family and Social History Never smoker; Marital Status - Married; Alcohol Use: Never; Drug Use: No History; Caffeine Use: Daily - T coffee; Financial Concerns: No; Food, ea; [name] ([id number]) [id number] Page 12 of 12 Clothing or Shelter Needs: No; Support System Lacking: No; Transportation Concerns: No Psychologist, prison and probation services) Signed: 10/20/2022 10:48:03 AM By: Duanne Guess MD FACS Entered By: Duanne Guess on 10/20/2022  10:42:00 -------------------------------------------------------------------------------- SuperBill Details Patient Name: Date of Service: [name], [name] 10/20/2022 Medical Record Number: [id number] Patient Account Number: [id number] Date of Birth/Sex: Treating RN: 14-Mar-1970 (52 y.o. 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, right ankle and foot Facility Procedures : CPT4 Code: [id number] Description: 97597 - DEBRIDE WOUND 1ST 20 SQ CM OR < ICD-10 Diagnosis Description 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 wit Modifier: hout evidence of n Quantity: 1 ecrosis : CPT4 Code: 66440347 Description: 11055 - PARE BENIGN LES; SGL ICD-10 Diagnosis Description L84 Corns and callosities Modifier: Quantity: 1 Physician Procedures : CPT4 Code Description Modifier [id number] 99213 - WC PHYS LEVEL 3 - EST PT 25 ICD-10 Diagnosis Description 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 E11.621 Type 2 diabetes mellitus with  foot ulcer L84 Corns and callosities 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  Positive for: Type II Diabetes Immunizations Pneumococcal Vaccine: Received Pneumococcal Vaccination: Yes Received Pneumococcal Vaccination On or After 60th Birthday: No Implantable Devices None Hospitalization / Surgery History Type of Hospitalization/Surgery Amuptation of 3rd,4th and 5th toes of Right foot;Oral Surgery;Anal Fissure surgery; Cholecystectomy Family and Social History Never smoker; Marital Status - Married; Alcohol Use: Never; Drug Use: No History; Caffeine Use: Daily - T coffee; Financial Concerns: No; Food, ea; [name] ([id number]) [id number] Page 12 of 12 Clothing or Shelter Needs: No; Support System Lacking: No; Transportation Concerns: No Psychologist, prison and probation services) Signed: 10/20/2022 10:48:03 AM By: Duanne Guess MD FACS Entered By: Duanne Guess on 10/20/2022  10:42:00 -------------------------------------------------------------------------------- SuperBill Details Patient Name: Date of Service: [name], [name] 10/20/2022 Medical Record Number: [id number] Patient Account Number: [id number] Date of Birth/Sex: Treating RN: 14-Mar-1970 (52 y.o. 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, right ankle and foot Facility Procedures : CPT4 Code: 01027253 Description: 97597 - DEBRIDE WOUND 1ST 20 SQ CM OR < ICD-10 Diagnosis Description 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 wit Modifier: hout evidence of n Quantity: 1 ecrosis : CPT4 Code: 66440347 Description: 11055 - PARE BENIGN LES; SGL ICD-10 Diagnosis Description L84 Corns and callosities Modifier: Quantity: 1 Physician Procedures : CPT4 Code Description Modifier [id number] 99213 - WC PHYS LEVEL 3 - EST PT 25 ICD-10 Diagnosis Description 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 E11.621 Type 2 diabetes mellitus with  foot ulcer L84 Corns and callosities 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 Secondary Dressing: Zetuvit Plus 4x8 in 1 x Per Day/30 Days Discharge Instructions: Apply over primary dressing as directed. Secured With: Elastic Bandage 4 inch (ACE bandage) 1 x Per Day/30 Days Discharge Instructions: Secure with ACE bandage as directed. Secured With: [employer], 4.5x3.1 (in/yd) 1 x Per Day/30 Days Discharge Instructions: Secure with Kerlix as directed. Secured With: Paper Tape, 2x10 (in/yd) (Generic) 1 x Per Day/30 Days Discharge Instructions: Secure dressing with tape as directed. Patient Medications llergies: No Known Allergies A Notifications Medication Indication Start End 10/20/2022 lidocaine DOSE topical 5 % ointment - ointment topical once daily Electronic Signature(s) Signed: 10/20/2022 10:48:03 AM By: Duanne Guess MD FACS Entered By: Duanne Guess on 10/20/2022 10:44:51 -------------------------------------------------------------------------------- Problem List Details Patient Name: Date of Service: [name], [name] 10/20/2022 10:00 [name] Medical Record Number: [id number] Patient Account Number: [id number] Date of Birth/Sex: Treating RN: [birth year] (52 y.o. 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 07/04/2021 No Yes M86.671 Other chronic osteomyelitis, right ankle and foot 07/04/2021 No  Yes Inactive Problems Resolved Problems ICD-10 Code Description Active Date Resolved Date L97.514 Non-pressure chronic ulcer of other part of right foot with necrosis of bone 07/04/2021 07/04/2021 Electronic Signature(s) Signed: 10/20/2022 10:38:44 AM By: Duanne Guess MD FACS Previous Signature: 10/20/2022 10:37:47 AM Version By: Duanne Guess MD FACS Entered By: Duanne Guess on 10/20/2022 10:38:44 -------------------------------------------------------------------------------- Progress Note Details Patient Name: Date of Service: [name], [name] 10/20/2022 10:00 [name] Medical Record Number: [id number] Patient Account Number: [id number] Date of Birth/Sex: Treating RN: 12-26-1970 (52 y.o. 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 The most recent amputation was in August 2022. At that operation, antibiotic beads were placed in the wound. He has been managed by podiatry for his procedures and management of his wounds. He has been in a Scientist, water quality. He is on oral doxycycline. They have been using Betadine wet to dry dressings along with Iodosorb. The patient states that when he thinks the wound is getting too dry, he applies topical Neosporin. At his last visit, on June 7 of this year, the podiatrist determined that he felt the wound was stalled and referred him to wound care for additional evaluation and management. An MRI has been ordered, but not yet scheduled or performed. Pathology from his operation in  August 2022 demonstrated findings consistent with chronic osteomyelitis. Today, there is a large irregular wound on the plantar  Positive for: Type II Diabetes Immunizations Pneumococcal Vaccine: Received Pneumococcal Vaccination: Yes Received Pneumococcal Vaccination On or After 60th Birthday: No Implantable Devices None Hospitalization / Surgery History Type of Hospitalization/Surgery Amuptation of 3rd,4th and 5th toes of Right foot;Oral Surgery;Anal Fissure surgery; Cholecystectomy Family and Social History Never smoker; Marital Status - Married; Alcohol Use: Never; Drug Use: No History; Caffeine Use: Daily - T coffee; Financial Concerns: No; Food, ea; [name] ([id number]) [id number] Page 12 of 12 Clothing or Shelter Needs: No; Support System Lacking: No; Transportation Concerns: No Psychologist, prison and probation services) Signed: 10/20/2022 10:48:03 AM By: Duanne Guess MD FACS Entered By: Duanne Guess on 10/20/2022  10:42:00 -------------------------------------------------------------------------------- SuperBill Details Patient Name: Date of Service: [name], [name] 10/20/2022 Medical Record Number: [id number] Patient Account Number: [id number] Date of Birth/Sex: Treating RN: 14-Mar-1970 (52 y.o. 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, right ankle and foot Facility Procedures : CPT4 Code: 01027253 Description: 97597 - DEBRIDE WOUND 1ST 20 SQ CM OR < ICD-10 Diagnosis Description 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 wit Modifier: hout evidence of n Quantity: 1 ecrosis : CPT4 Code: 66440347 Description: 11055 - PARE BENIGN LES; SGL ICD-10 Diagnosis Description L84 Corns and callosities Modifier: Quantity: 1 Physician Procedures : CPT4 Code Description Modifier [id number] 99213 - WC PHYS LEVEL 3 - EST PT 25 ICD-10 Diagnosis Description 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 E11.621 Type 2 diabetes mellitus with  foot ulcer L84 Corns and callosities 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 10/20/2022 10:00 [name] Medical Record Number: [id number] Patient Account Number: [id number] Date of Birth/Sex: Treating RN: [birth year] (52 y.o. [name] Primary Care Provider: Tana Conch Other Clinician: Referring Provider: Treating Provider/Extender: Arvin Collard in Treatment: 67 Information Obtained from: Patient Chief Complaint Patients presents for treatment of an open diabetic ulcer Electronic Signature(s) Signed: 10/20/2022 10:39:30 AM By: Duanne Guess MD FACS Entered By: Duanne Guess on 10/20/2022 10:39:30 -------------------------------------------------------------------------------- Debridement Details Patient Name: Date of Service: [name], [name] 10/20/2022 10:00 [name] Medical Record Number: [id number] Patient Account Number: [id number] Date of Birth/Sex: Treating RN: 1970/07/29 (52 y.o. Cline Cools Primary Care Provider: Tana Conch Other Clinician: Referring Provider: Treating Provider/Extender: Arvin Collard in Treatment: 67 Debridement Performed for Assessment: Wound #4 Right,Distal,Lateral Foot Performed By: Physician Duanne Guess, MD The following information was scribed by: Redmond Pulling The information was scribed for: Duanne Guess Debridement Type: Debridement Severity of Tissue Pre Debridement: Fat layer exposed Level of Consciousness (Pre-procedure): Awake and Alert Pre-procedure Verification/Time Out Yes - 10:25 Taken: Start Time: 10:30 Pain Control: Lidocaine 5% topical ointment Percent of Wound Bed Debrided: 100% T Area Debrided (cm): otal 0.07 Tissue and other material debrided: Non-Viable, Slough, Skin: Dermis , Skin: Epidermis, Slough Level: Skin/Epidermis Debridement Description:  Selective/Open Wound Instrument: Curette Bleeding: Minimum Hemostasis Achieved: Pressure Response to Treatment: Procedure was tolerated well Level of Consciousness (Post- Awake and Alert procedure): Post Debridement Measurements of Total Wound Length: (cm) 0.3 Width: (cm) 0.3 Depth: (cm) 0.1 Volume: (cm) 0.007 Character of Wound/Ulcer Post Debridement: Improved Severity of Tissue Post Debridement: Fat layer exposed [name] ([id number]) [id number] Page 2 of 12 Post Procedure Diagnosis Same as Pre-procedure Electronic Signature(s) Signed: 10/20/2022 10:48:03 AM By: Duanne Guess MD FACS Signed: 10/20/2022 3:18:38 PM By: Redmond Pulling RN, BSN Entered By: Redmond Pulling on 10/20/2022 10:36:14 -------------------------------------------------------------------------------- HPI Details Patient Name: Date of Service: [name], [name] 10/20/2022 10:00 [name] Medical Record Number: [id number] Patient Account Number: [id number] Date of Birth/Sex: Treating RN: Oct 14, 1970 (52 y.o. 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 The most recent amputation was in August 2022. At that operation, antibiotic beads were placed in the wound. He has been managed by podiatry for his procedures and management of his wounds. He has been in a Scientist, water quality. He is on oral doxycycline. They have been using Betadine wet to dry dressings along with Iodosorb. The patient states that when he thinks the wound is getting too dry, he applies topical Neosporin. At his last visit, on June 7 of this year, the podiatrist  determined that he felt the wound was stalled and referred him to wound care for additional evaluation and management. An MRI has been ordered, but not yet scheduled or performed. Pathology from his operation in August 2022 demonstrated findings consistent with chronic osteomyelitis. Today, there is a large irregular wound on the plantar surface of his right foot, at about the level of the fifth metatarsal head. This tracks through to a pinpoint opening on  clinically healed. [name] felt like he wanted to have another evaluation in our clinic to make sure that I agreed; he does have an additional follow-up scheduled in Dr. Eliane Decree office on October 25. Electronic Signature(s) Signed: 10/20/2022 10:41:32 AM By: Duanne Guess MD FACS Entered By: Duanne Guess on 10/20/2022 10:41:32 -------------------------------------------------------------------------------- Paring/cutting 1 benign hyperkeratotic lesion Details Patient Name: Date of Service: [name], [name] 10/20/2022 10:00 [name] Medical Record Number: [id number] Patient Account Number: [id number] Date of Birth/Sex:  Treating RN: [birth year] (52 y.o. Cline Cools Primary Care Provider: Tana Conch Other Clinician: Referring Provider: Treating Provider/Extender: Arvin Collard in Treatment: 67 Procedure Performed for: Non-Wound Location Performed By: Physician Duanne Guess, MD The following information was scribed by: Redmond Pulling The information was scribed for: Duanne Guess Post Procedure Diagnosis Same as Pre-procedure Electronic Signature(s) Signed: 10/20/2022 10:48:03 AM By: Duanne Guess MD FACS Signed: 10/20/2022 3:18:38 PM By: Redmond Pulling RN, BSN [name] ([id number]) [id number] Page 5 of 12 Entered By: Redmond Pulling on 10/20/2022 10:34:58 -------------------------------------------------------------------------------- Physical Exam Details Patient Name: Date of Service: [name], [name] 10/20/2022 10:00 [name] Medical Record Number: [id number] Patient Account Number: [id number] Date of Birth/Sex: Treating RN: 10-25-1970 (52 y.o. [name] Primary Care Provider: Tana Conch Other Clinician: Referring Provider: Treating Provider/Extender: Arvin Collard in Treatment: 12 Constitutional Hypertensive, asymptomatic. . . . no acute distress. Respiratory Normal work of breathing on room air. Notes 10/20/2022: The wound on the dorsal lateral aspect of his foot appears to be completely closed with just a little bit of crust around the edges. On the plantar foot, however, he has extensive thick callus. This was debrided to expose the wound site. There is a tiny superficial opening present but it does extend to the fat layer. Electronic Signature(s) Signed: 10/20/2022 10:44:35 AM By: Duanne Guess MD FACS Entered By: Duanne Guess on 10/20/2022 10:44:34 -------------------------------------------------------------------------------- Physician Orders Details Patient Name: Date of  Service: [name], [name] 10/20/2022 10:00 [name] Medical Record Number: [id number] Patient Account Number: [id number] Date of Birth/Sex: Treating RN: January 08, 1971 (52 y.o. 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 - Dr. Lady Gary - room 2 Monday 10/27/22 @ 0915 Return A Anesthetic (In clinic) Topical Lidocaine 5% applied to wound bed Bathing/ Shower/ Hygiene May shower and wash wound with soap and water. Additional Orders / Instructions Follow Nutritious Diet - incorporate protein shakes Other: - vitamin C 500 mg 3 times a day and zinc 30-50 mg a day [name] ([id number]) [phone].pdf Page 6 of 12 Juven Shake 1-2 times daily. Wound Treatment Wound #4 - Foot Wound Laterality: Right, Lateral, Distal Cleanser: Soap and Water 1 x Per Day/30 Days Discharge Instructions: May shower and wash wound with dial antibacterial soap and water prior to dressing change. Cleanser: Wound Cleanser (Generic) 1 x Per Day/30 Days Discharge Instructions: Cleanse the wound with wound cleanser prior to applying a clean dressing using gauze sponges, not tissue or cotton balls. Prim Dressing: Maxorb Extra Ag+ Alginate  Positive for: Type II Diabetes Immunizations Pneumococcal Vaccine: Received Pneumococcal Vaccination: Yes Received Pneumococcal Vaccination On or After 60th Birthday: No Implantable Devices None Hospitalization / Surgery History Type of Hospitalization/Surgery Amuptation of 3rd,4th and 5th toes of Right foot;Oral Surgery;Anal Fissure surgery; Cholecystectomy Family and Social History Never smoker; Marital Status - Married; Alcohol Use: Never; Drug Use: No History; Caffeine Use: Daily - T coffee; Financial Concerns: No; Food, ea; [name] ([id number]) [id number] Page 12 of 12 Clothing or Shelter Needs: No; Support System Lacking: No; Transportation Concerns: No Psychologist, prison and probation services) Signed: 10/20/2022 10:48:03 AM By: Duanne Guess MD FACS Entered By: Duanne Guess on 10/20/2022  10:42:00 -------------------------------------------------------------------------------- SuperBill Details Patient Name: Date of Service: [name], [name] 10/20/2022 Medical Record Number: [id number] Patient Account Number: [id number] Date of Birth/Sex: Treating RN: 14-Mar-1970 (52 y.o. 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, right ankle and foot Facility Procedures : CPT4 Code: 01027253 Description: 97597 - DEBRIDE WOUND 1ST 20 SQ CM OR < ICD-10 Diagnosis Description 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 wit Modifier: hout evidence of n Quantity: 1 ecrosis : CPT4 Code: 66440347 Description: 11055 - PARE BENIGN LES; SGL ICD-10 Diagnosis Description L84 Corns and callosities Modifier: Quantity: 1 Physician Procedures : CPT4 Code Description Modifier [id number] 99213 - WC PHYS LEVEL 3 - EST PT 25 ICD-10 Diagnosis Description 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 E11.621 Type 2 diabetes mellitus with  foot ulcer L84 Corns and callosities 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 [name] felt like he wanted to have another evaluation in our clinic to make sure that I agreed; he does have an additional follow-up scheduled in Dr. Eliane Decree office on October 25. Electronic Signature(s) Signed: 10/20/2022 10:41:32 AM By: Duanne Guess MD FACS Entered By: Duanne Guess on 10/20/2022 10:41:32 -------------------------------------------------------------------------------- Paring/cutting 1 benign hyperkeratotic lesion Details Patient Name: Date of Service: [name], [name] 10/20/2022 10:00 [name] Medical Record Number: [id number] Patient Account Number: [id number] Date of Birth/Sex:  Treating RN: [birth year] (52 y.o. Cline Cools Primary Care Provider: Tana Conch Other Clinician: Referring Provider: Treating Provider/Extender: Arvin Collard in Treatment: 67 Procedure Performed for: Non-Wound Location Performed By: Physician Duanne Guess, MD The following information was scribed by: Redmond Pulling The information was scribed for: Duanne Guess Post Procedure Diagnosis Same as Pre-procedure Electronic Signature(s) Signed: 10/20/2022 10:48:03 AM By: Duanne Guess MD FACS Signed: 10/20/2022 3:18:38 PM By: Redmond Pulling RN, BSN [name] ([id number]) [id number] Page 5 of 12 Entered By: Redmond Pulling on 10/20/2022 10:34:58 -------------------------------------------------------------------------------- Physical Exam Details Patient Name: Date of Service: [name], [name] 10/20/2022 10:00 [name] Medical Record Number: [id number] Patient Account Number: [id number] Date of Birth/Sex: Treating RN: 10-25-1970 (52 y.o. [name] Primary Care Provider: Tana Conch Other Clinician: Referring Provider: Treating Provider/Extender: Arvin Collard in Treatment: 12 Constitutional Hypertensive, asymptomatic. . . . no acute distress. Respiratory Normal work of breathing on room air. Notes 10/20/2022: The wound on the dorsal lateral aspect of his foot appears to be completely closed with just a little bit of crust around the edges. On the plantar foot, however, he has extensive thick callus. This was debrided to expose the wound site. There is a tiny superficial opening present but it does extend to the fat layer. Electronic Signature(s) Signed: 10/20/2022 10:44:35 AM By: Duanne Guess MD FACS Entered By: Duanne Guess on 10/20/2022 10:44:34 -------------------------------------------------------------------------------- Physician Orders Details Patient Name: Date of  Service: [name], [name] 10/20/2022 10:00 [name] Medical Record Number: [id number] Patient Account Number: [id number] Date of Birth/Sex: Treating RN: January 08, 1971 (52 y.o. 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 - Dr. Lady Gary - room 2 Monday 10/27/22 @ 0915 Return A Anesthetic (In clinic) Topical Lidocaine 5% applied to wound bed Bathing/ Shower/ Hygiene May shower and wash wound with soap and water. Additional Orders / Instructions Follow Nutritious Diet - incorporate protein shakes Other: - vitamin C 500 mg 3 times a day and zinc 30-50 mg a day [name] ([id number]) [phone].pdf Page 6 of 12 Juven Shake 1-2 times daily. Wound Treatment Wound #4 - Foot Wound Laterality: Right, Lateral, Distal Cleanser: Soap and Water 1 x Per Day/30 Days Discharge Instructions: May shower and wash wound with dial antibacterial soap and water prior to dressing change. Cleanser: Wound Cleanser (Generic) 1 x Per Day/30 Days Discharge Instructions: Cleanse the wound with wound cleanser prior to applying a clean dressing using gauze sponges, not tissue or cotton balls. Prim Dressing: Maxorb Extra Ag+ Alginate  Positive for: Type II Diabetes Immunizations Pneumococcal Vaccine: Received Pneumococcal Vaccination: Yes Received Pneumococcal Vaccination On or After 60th Birthday: No Implantable Devices None Hospitalization / Surgery History Type of Hospitalization/Surgery Amuptation of 3rd,4th and 5th toes of Right foot;Oral Surgery;Anal Fissure surgery; Cholecystectomy Family and Social History Never smoker; Marital Status - Married; Alcohol Use: Never; Drug Use: No History; Caffeine Use: Daily - T coffee; Financial Concerns: No; Food, ea; [name] ([id number]) [id number] Page 12 of 12 Clothing or Shelter Needs: No; Support System Lacking: No; Transportation Concerns: No Psychologist, prison and probation services) Signed: 10/20/2022 10:48:03 AM By: Duanne Guess MD FACS Entered By: Duanne Guess on 10/20/2022  10:42:00 -------------------------------------------------------------------------------- SuperBill Details Patient Name: Date of Service: [name], [name] 10/20/2022 Medical Record Number: [id number] Patient Account Number: [id number] Date of Birth/Sex: Treating RN: 14-Mar-1970 (52 y.o. 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, right ankle and foot Facility Procedures : CPT4 Code: [id number] Description: 97597 - DEBRIDE WOUND 1ST 20 SQ CM OR < ICD-10 Diagnosis Description 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 wit Modifier: hout evidence of n Quantity: 1 ecrosis : CPT4 Code: [id number] Description: 11055 - PARE BENIGN LES; SGL ICD-10 Diagnosis Description L84 Corns and callosities Modifier: Quantity: 1 Physician Procedures : CPT4 Code Description Modifier [id number] 99213 - WC PHYS LEVEL 3 - EST PT 25 ICD-10 Diagnosis Description 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 E11.621 Type 2 diabetes mellitus with  foot ulcer L84 Corns and callosities 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

## 2022-10-20 NOTE — Progress Notes (Signed)
[name] [name] [name], [name] [name] [name] ([id number]) [id number] Page 1 of 7 Visit Report for 10/20/2022 Arrival Information Details Patient Name: Date of Service: [name] [name] [name], [name] [name] [name] 10/20/2022 10:00 A [name] [name] Medical Record Number: [id number] Patient Account Number: [id number] Date of Birth/Sex: Treating RN: 1970/05/17 (52 y.o. [name] [name] Primary Care [name] [name] [name]: [name] [name] [name] Other Clinician: Referring [name] [name] [name]: Treating [name] [name] [name]/Extender: [name] [name] [name]: 73 Visit Information History Since Last Visit Added or deleted any medications: No Patient Arrived: Ambulatory Any new allergies or adverse reactions: No Arrival Time: 10:10 Had a fall or experienced change [name] [name] No Accompanied By: self activities of daily living that may affect Transfer Assistance: None risk of falls: Patient Identification Verified: Yes Signs or symptoms of abuse/neglect since last visito No Secondary Verification Process Completed: Yes Hospitalized since last visit: No Patient Requires Transmission-Based Precautions: No Implantable device outside of the clinic excluding No Patient Has Alerts: Yes cellular tissue based products placed [name] [name] the center since last visit: Pain Present Now: No Electronic Signature(s) Signed: 10/20/2022 11:37:32 AM By: [name] [name] [name] Entered By: [name] [name] [name] on 10/20/2022 10:10:28 -------------------------------------------------------------------------------- Encounter Discharge Information Details Patient Name: Date of Service: [name] [name] [name], [name] [name] [name] [name] 10/20/2022 10:00 A [name] [name] Medical Record Number: [id number] Patient Account Number: [id number] Date of Birth/Sex: Treating RN: 02-09-1970 (52 y.o. [name] [name] [name] Primary Care [name] [name] [name]: [name] [name] [name] Other Clinician: Referring Latrel [name] [name]: Treating [name] [name] [name]/Extender: [name] [name] [name]0 Encounter Discharge Information Items Post Procedure Vitals Discharge Condition: Stable Temperature  (F): 98.1 Ambulatory Status: Ambulatory Pulse (bpm): 78 Discharge Destination: Home Respiratory Rate (breaths/min): 18 Transportation: Private Auto Blood Pressure (mmHg): 151/92 Accompanied By: self Schedule Follow-up Appointment: Yes Clinical Summary of Care: Patient Declined Electronic Signature(s) Signed: 10/20/2022 3:18:38 PM By: [name] Pulling RN, BSN Entered By: [name] [name] [name] on 10/20/2022 10:48:20 [name] [name] [name], [name] [name] [name] ([id number]) [id number] Page 2 of 7 -------------------------------------------------------------------------------- Lower Extremity Assessment Details Patient Name: Date of Service: [name] [name] [name], [name] [name] [name] [name] 10/20/2022 10:00 A [name] [name] Medical Record Number: [id number] Patient Account Number: [id number] Date of Birth/Sex: Treating RN: 1970/04/24 (52 y.o. [name] [name] [name] Primary Care [name] [name] [name]: [name] [name] [name] Other Clinician: Referring [name] [name] [name]: Treating [name] [name] [name]/Extender: [name] [name] [name]: 67 Edema Assessment Assessed: [name] [name] [name]: No] [Right: No] Edema: [Left: N] [Right: o] Calf Left: Right: Point of Measurement: From Medial Instep 41 cm Ankle Left: Right: Point of Measurement: From Medial Instep 24 cm Vascular Assessment Pulses: Dorsalis Pedis Palpable: [Right:Yes] Extremity colors, hair growth, and conditions: Extremity Color: [Right:Hyperpigmented] Hair Growth on Extremity: [Right:No] Temperature of Extremity: [Right:Warm < 3 seconds] Electronic Signature(s) Signed: 10/20/2022 3:18:38 PM By: [name] Pulling RN, BSN Entered By: [name] [name] [name] on 10/20/2022 10:17:08 -------------------------------------------------------------------------------- Multi Wound Chart Details Patient Name: Date of Service: [name] [name] [name], [name] [name] [name] [name] 10/20/2022 10:00 A [name] [name] Medical Record Number: [id number] Patient Account Number: [id number] Date of Birth/Sex: Treating RN: 11-06-1970 (52 y.o. [name] [name] Primary Care [name] [name] [name]: [name] [name] [name] Other  Clinician: Referring [name] [name] [name]: Treating [name] [name] [name]/Extender: [name] [name] [name]: 67 Vital Signs Height([name] [name]: 72 Capillary Blood Glucose(mg/dl): 254 Weight(lbs): 270 Pulse(bpm): 78 Body Mass Index(BMI): 42.3 Blood Pressure(mmHg): 151/92 Temperature(F): 98.1 Respiratory Rate(breaths/min): 18 [4:Photos:] [N/A:N/A] Right, Distal, Lateral Foot N/A N/A Wound Location: Blister N/A N/A Wounding Event: Diabetic Wound/Ulcer of the Lower N/A N/A Primary Etiology: Extremity Type II Diabetes N/A N/A Comorbid History: 05/06/2022 N/A N/A Date Acquired: 34 N/A N/A Weeks of Treatment: Open N/A N/A Wound Status: No N/A N/A Wound Recurrence: 0.1x0.1x0.1 N/A N/A Measurements L x W x D (cm) 0.008 N/A N/A  A (cm) : rea 0.001 N/A N/A Volume (cm) : 99.50% N/A N/A % Reduction [name] [name] A rea: 99.40% N/A N/A % Reduction [name] [name] Volume: Grade 2 N/A N/A Classification: Medium N/A N/A Exudate A mount: Serosanguineous N/A N/A Exudate Type: red, brown N/A N/A Exudate Color: Well defined, not attached N/A N/A Wound Margin: Large (67-100%) N/A N/A Granulation A mount: Red N/A N/A Granulation Quality: Small (1-33%) N/A N/A Necrotic A mount: Fat Layer (Subcutaneous Tissue): Yes N/A N/A Exposed Structures: Muscle: Yes Fascia: No Tendon: No Joint: No Bone: No Small (1-33%) N/A N/A Epithelialization: Debridement - Selective/Open Wound N/A N/A Debridement: Pre-procedure Verification/Time Out 10:25 N/A N/A Taken: Lidocaine 5% topical ointment N/A N/A Pain Control: Slough N/A N/A Tissue Debrided: Skin/Epidermis N/A N/A Level: 0.07 N/A N/A Debridement A (sq cm): rea Curette N/A N/A Instrument: Minimum N/A N/A Bleeding: Pressure N/A N/A Hemostasis A chieved: Procedure was tolerated well N/A N/A Debridement Treatment Response: 0.3x0.3x0.1 N/A N/A Post Debridement Measurements L x W x D (cm) 0.007 N/A N/A Post Debridement Volume: (cm) Callus: Yes N/A  N/A Periwound Skin Texture: Maceration: Yes N/A N/A Periwound Skin Moisture: Dry/Scaly: No No Abnormalities Noted N/A N/A Periwound Skin Color: No Abnormality N/A N/A Temperature: Yes N/A N/A Tenderness on Palpation: Debridement N/A N/A Procedures Performed: Treatment Notes Electronic Signature(s) Signed: 10/20/2022 10:38:57 AM By: Duanne Guess MD FACS Previous Signature: 10/20/2022 10:38:08 AM Version By: Duanne Guess MD FACS Entered By: Duanne Guess on 10/20/2022 10:38:57 -------------------------------------------------------------------------------- Multi-Disciplinary Care Plan Details Patient Name: Date of Service: [name] [name] [name], [name] [name] [name] [name] 10/20/2022 10:00 A [name] [name] Medical Record Number: [id number] Patient Account Number: [id number] Date of Birth/Sex: Treating RN: [birth year] (52 y.o. [name] [name] [name] Primary Care Breahna Boylen: [name] [name] [name] Other Clinician: Referring Zyrell Carmean: Treating Naija Troost/Extender: [name] [name] [name]: 67 [name] [name] [name], [name] [name] [name] ([id number]) [id number] Page 4 of 7 Multidisciplinary Care Plan reviewed with physician Active Inactive Nutrition Nursing Diagnoses: Impaired glucose control: actual or potential Potential for alteratiion [name] [name] Nutrition/Potential for imbalanced nutrition Goals: Patient/caregiver will maintain therapeutic glucose control Date Initiated: 07/29/2021 Target Resolution Date: 11/13/2022 Goal Status: Active Interventions: Assess patient nutrition upon admission and as needed per policy Provide education on elevated blood sugars and impact on wound healing Treatment Activities: Patient referred to Primary Care Physician for further nutritional evaluation : 07/29/2021 Notes: Wound/Skin Impairment Nursing Diagnoses: Impaired tissue integrity Goals: Patient/caregiver will verbalize understanding of skin care regimen Date Initiated: 07/04/2021 Target Resolution Date: 11/20/2022 Goal  Status: Active Interventions: Assess ulceration(s) every visit Treatment Activities: Skin care regimen initiated : 07/04/2021 Notes: Electronic Signature(s) Signed: 10/20/2022 3:18:38 PM By: [name] Pulling RN, BSN Entered By: [name] [name] [name] on 10/20/2022 10:22:15 -------------------------------------------------------------------------------- Pain Assessment Details Patient Name: Date of Service: [name] [name] [name], [name] [name] [name] [name] 10/20/2022 10:00 A [name] [name] Medical Record Number: [id number] Patient Account Number: [id number] Date of Birth/Sex: Treating RN: 01-Oct-1970 (52 y.o. [name] [name] Primary Care Sahira Cataldi: [name] [name] [name] Other Clinician: Referring Pio Eatherly: Treating Mccoy Testa/Extender: [name] [name] [name]: 67 Active Problems Location of Pain Severity and Description of Pain Patient Has Paino No Site Locations [name] [name] [name], [name] [name] [name] [place] ([id number]) [id number] Page 5 of 7 Pain Management and Medication Current Pain Management: Electronic Signature(s) Signed: 10/20/2022 11:37:32 AM By: [name] [name] [name] Entered By: [name] [name] [name] on 10/20/2022 10:11:06 -------------------------------------------------------------------------------- Patient/Caregiver Education Details Patient Name: Date of Service: [name] [name] [name], [name] [name] [name] [name] 9/30/2024andnbsp10:00 A [name] [name] Medical Record Number: [id number] Patient Account Number: [id number] Date of Birth/Gender: Treating RN: 1970-10-09 (52 y.o. [name] [name] [name] Primary Care Physician: [name] [name] [name] Other Clinician:  Referring Physician: Treating Physician/Extender: [name] [name] [name]: 40 Education Assessment Education Provided To: Patient Education Topics Provided Wound/Skin Impairment: Methods: Explain/Verbal Responses: State content correctly [employer]) Signed: 10/20/2022 3:18:38 PM By: [name] Pulling RN, BSN Entered By: [name] [name] [name] on 10/20/2022  10:22:32 -------------------------------------------------------------------------------- Wound Assessment Details Patient Name: Date of Service: [name] [name] [name], [name] [name] [name] [name] 10/20/2022 10:00 A [name] [name] Medical Record Number: [id number] Patient Account Number: [id number] Date of Birth/Sex: Treating RN: 1970-11-03 (52 y.o. [name] [name] Primary Care Maida Widger: [name] [name] [name] Other Clinician: Referring Meggie Laseter: Treating Charlann Wayne/Extender: Candee Furbish [name] [name] [name], [name] [name] [name] ([id number]) [id number] Page 6 of 7 Weeks [name] [name]: 67 Wound Status Wound Number: 4 Primary Etiology: Diabetic Wound/Ulcer of the Lower Extremity Wound Location: Right, Distal, Lateral Foot Wound Status: Open Wounding Event: Blister Comorbid History: Type II Diabetes Date Acquired: 05/06/2022 Weeks Of Treatment: 23 Clustered Wound: No Photos Wound Measurements Length: (cm) 0.1 Width: (cm) 0.1 Depth: (cm) 0.1 Area: (cm) 0.008 Volume: (cm) 0.001 % Reduction [name] [name] Area: 99.5% % Reduction [name] [name] Volume: 99.4% Epithelialization: Small (1-33%) Wound Description Classification: Grade 2 Wound Margin: Well defined, not attached Exudate Amount: Medium Exudate Type: Serosanguineous Exudate Color: red, brown Foul Odor After Cleansing: No Slough/Fibrino Yes Wound Bed Granulation Amount: Large (67-100%) Exposed Structure Granulation Quality: Red Fascia Exposed: No Necrotic Amount: Small (1-33%) Fat Layer (Subcutaneous Tissue) Exposed: Yes Necrotic Quality: Adherent Slough Tendon Exposed: No Muscle Exposed: Yes Necrosis of Muscle: No Joint Exposed: No Bone Exposed: No Periwound Skin Texture Texture Color No Abnormalities Noted: No No Abnormalities Noted: Yes Callus: Yes Temperature / Pain Temperature: No Abnormality Moisture No Abnormalities Noted: No Tenderness on Palpation: Yes Dry / Scaly: No Maceration: Yes Treatment Notes Wound #4 (Foot) Wound Laterality: Right, Lateral,  Distal Cleanser Soap and Water Discharge Instruction: May shower and wash wound with dial antibacterial soap and water prior to dressing change. Wound Cleanser Discharge Instruction: Cleanse the wound with wound cleanser prior to applying a clean dressing using gauze sponges, not tissue or cotton balls. Peri-Wound Care Topical Primary Dressing Maxorb Extra Ag+ Alginate Dressing, 2x2 ([name] [name]/[name] [name] [name] [name] [name], [name] [name] [name] ([id number]) [id number] Page 7 of 7 Discharge Instruction: Apply to wound bed as instructed Secondary Dressing Woven Gauze Sponge, Non-Sterile 4x4 [name] [name] Discharge Instruction: Apply over primary dressing as directed. Zetuvit Plus 4x8 [name] [name] Discharge Instruction: Apply over primary dressing as directed. Secured With Elastic Bandage 4 inch (ACE bandage) Discharge Instruction: Secure with ACE bandage as directed. Kerlix Roll Sterile, 4.5x3.1 ([name] [name]/yd) Discharge Instruction: Secure with Kerlix as directed. Paper Tape, 2x10 ([name] [name]/yd) Discharge Instruction: Secure dressing with tape as directed. Compression Wrap Compression Stockings Add-Ons Electronic Signature(s) Signed: 10/20/2022 11:37:32 AM By: [name] [name] [name] Entered By: [name] [name] [name] on 10/20/2022 10:15:28 -------------------------------------------------------------------------------- Vitals Details Patient Name: Date of Service: [name] [name] [name], [name] [name] [name] [name] 10/20/2022 10:00 A [name] [name] Medical Record Number: [id number] Patient Account Number: [id number] Date of Birth/Sex: Treating RN: 08/22/1970 (52 y.o. [name] [name] Primary Care Carrissa Taitano: [name] [name] [name] Other Clinician: Referring Bronte Kropf: Treating Deondrick Searls/Extender: [name] [name] [name]: 67 Vital Signs Time Taken: 10:10 Temperature (F): 98.1 Height ([name] [name]: 72 Pulse (bpm): 78 Weight (lbs): 312 Respiratory Rate (breaths/min): 18 Body Mass Index (BMI): 42.3 Blood Pressure (mmHg): 151/92 Capillary Blood Glucose (mg/dl): 644 Reference Range: 80 - 120 mg /  dl Electronic Signature(s) Signed: 10/20/2022 11:37:32 AM By: [name] [name] [name] Entered By: [name] [name] [name] on 10/20/2022 10:10:52

## 2022-10-25 ENCOUNTER — Other Ambulatory Visit: Payer: Self-pay | Admitting: Family Medicine

## 2022-10-27 ENCOUNTER — Encounter (HOSPITAL_BASED_OUTPATIENT_CLINIC_OR_DEPARTMENT_OTHER): Payer: BLUE CROSS/BLUE SHIELD | Attending: General Surgery | Admitting: General Surgery

## 2022-10-27 DIAGNOSIS — L84 Corns and callosities: Secondary | ICD-10-CM | POA: Insufficient documentation

## 2022-10-27 DIAGNOSIS — E1149 Type 2 diabetes mellitus with other diabetic neurological complication: Secondary | ICD-10-CM | POA: Diagnosis not present

## 2022-10-27 DIAGNOSIS — L97515 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 without evidence of necrosis: Secondary | ICD-10-CM | POA: Insufficient documentation

## 2022-10-27 DIAGNOSIS — E11621 Type 2 diabetes mellitus with foot ulcer: Secondary | ICD-10-CM | POA: Insufficient documentation

## 2022-10-27 DIAGNOSIS — E11622 Type 2 diabetes mellitus with other skin ulcer: Secondary | ICD-10-CM | POA: Diagnosis not present

## 2022-10-27 DIAGNOSIS — E1151 Type 2 diabetes mellitus with diabetic peripheral angiopathy without gangrene: Secondary | ICD-10-CM | POA: Diagnosis not present

## 2022-10-27 DIAGNOSIS — M86671 Other chronic osteomyelitis, right ankle and foot: Secondary | ICD-10-CM | POA: Diagnosis not present

## 2022-10-27 NOTE — Progress Notes (Signed)
Complex Wound Cleansing - multiple wounds X- 1 5 Wound Imaging (photographs - any number of wounds) []  - 0 Wound Tracing (instead of photographs) X- 1 5 Simple Wound Measurement - one wound []  - 0 Complex Wound Measurement - multiple wounds INTERVENTIONS - Wound Dressings []  - 0 Small Wound Dressing one or multiple wounds []  - 0 Medium Wound Dressing one or multiple wounds []  - 0 Large Wound Dressing one or multiple wounds X- 1 5 Application of Medications - topical []  - 0 Application of Medications - injection INTERVENTIONS - Miscellaneous []  - 0 External ear exam []  - 0 Specimen Collection (cultures, biopsies, blood, body fluids, etc.) []  - 0 Specimen(s) / Culture(s) sent or taken to Lab for analysis []  - 0 Patient Transfer (multiple staff / Nurse, adult / Similar devices) []  - 0 Simple Staple / Suture removal (25 or less) []  - 0 Complex Staple / Suture removal (26 or more) []  - 0 Hypo / Hyperglycemic Management (close monitor of Blood Glucose) []  - 0 Ankle / Brachial Index (ABI) - do not check if billed separately [name] [name], [name] [name] ([id number]) [phone].pdf Page 3 of 8 X- 1 5 Vital Signs Has the patient been seen at the hospital within the last three years: Yes Total Score: 65 Level Of [name]: New/Established - Level 2 Electronic Signature(s) Signed: 10/27/2022 4:26:21 PM By: [name] Entered By: [name] on 10/27/2022 06:20:00 -------------------------------------------------------------------------------- Encounter Discharge Information Details Patient Name: Date of Service: [name], [name] [name]. 10/27/2022 9:15 A [name] Medical Record Number: [id number] Patient Account Number: [id number] Date of Birth/Sex: Treating  RN: [name] (52 y.o. [name] [name] [name] [name]: [name] [name] Other Clinician: Referring [name] [name]: Treating [name] [name]/Extender: [name] [name] in Treatment: 50 Encounter Discharge Information Items Discharge Condition: Stable Ambulatory Status: Ambulatory Discharge Destination: Home Transportation: Private Auto Accompanied By: self Schedule Follow-up Appointment: No Clinical Summary of [name]: Patient Declined Electronic Signature(s) Signed: 10/27/2022 4:26:21 PM By: [name] Entered By: [name] on 10/27/2022 06:20:26 -------------------------------------------------------------------------------- Lower Extremity Assessment Details Patient Name: Date of Service: [name], [name] [name]. 10/27/2022 9:15 A [name] Medical Record Number: [id number] Patient Account Number: [id number] Date of Birth/Sex: Treating RN: [name] [name] (52 y.o. [name] [name] Ladaja Yusupov: [name] [name] Other Clinician: Referring Abubakar Crispo: Treating [name] [name]/Extender: [name] [name] in Treatment: 68 Edema Assessment Assessed: [name] [name]: No] [Right: No] Edema: [Left: N] [Right: o] Calf Left: Right: Point of Measurement: From Medial Instep 41 cm Ankle Left: Right: Point of Measurement: From Medial Instep 24 cm Vascular Assessment Extremity colors, hair growth, and conditions: [name] [name], [name] [name] ([id number]) [Right:[id number] Page 4 of 8] Extremity Color: [Right:Hyperpigmented] Hair Growth on Extremity: [Right:No] Temperature of Extremity: [Right:Warm < 3 seconds] Electronic Signature(s) Signed: 10/27/2022 4:26:21 PM By: [name] Entered By: [name] on 10/27/2022 06:10:14 -------------------------------------------------------------------------------- Multi Wound Chart Details Patient Name: Date of Service: [name], [name] [name]. 10/27/2022 9:15 A [name] Medical Record Number:  [id number] Patient Account Number: [id number] Date of Birth/Sex: Treating RN: [name] (52 y.o. [name]) Primary [name] [name]: [name] [name] Other Clinician: Referring [name] [name]: Treating [name] [name]/Extender: [name] [name] in Treatment: 68 Vital Signs Height(in): 72 Capillary Blood Glucose(mg/dl): 595 Weight(lbs): 638 Pulse(bpm): 80 Body Mass Index(BMI): 42.3 Blood Pressure(mmHg): 145/87 Temperature(F): 98.1 Respiratory Rate(breaths/min): 18 [4:Photos:] [N/A:N/A] Right, Distal, Lateral Foot N/A N/A Wound Location: Blister N/A N/A Wounding Event: Diabetic Wound/Ulcer of the Lower N/A N/A Primary Etiology: Extremity Type II Diabetes N/A N/A Comorbid History: 05/06/2022 N/A N/A Date  [name] [name], [name] [name] ([id number]) [id number] Page 1 of 8 Visit Report for 10/27/2022 Arrival Information Details Patient Name: Date of Service: [name] [name]. 10/27/2022 9:15 A [name] Medical Record Number: [id number] Patient Account Number: [id number] Date of Birth/Sex: Treating RN: [birth year] (52 y.o. [name]) Primary [name] [name] [name]: [name] [name] Other Clinician: Referring [name] [name]: Treating [name] [name]/Extender: [name] [name] in Treatment: 68 Visit Information History Since Last Visit Added or deleted any medications: No Patient Arrived: Ambulatory Any new allergies or adverse reactions: No Arrival Time: 09:07 Had a fall or experienced change in No Accompanied By: self activities of daily living that may affect Transfer Assistance: None risk of falls: Patient Identification Verified: Yes Signs or symptoms of abuse/neglect since last visito No Secondary Verification Process Completed: Yes Hospitalized since last visit: No Patient Requires Transmission-Based Precautions: No Implantable device outside of the clinic excluding No Patient Has Alerts: Yes cellular tissue based products placed in the center since last visit: Pain Present Now: No Electronic Signature(s) Signed: 10/27/2022 9:35:04 AM By: [name] [name] Entered By: [name] [name] on 10/27/2022 06:07:44 -------------------------------------------------------------------------------- Clinic Level of [name] Assessment Details Patient Name: Date of Service: [name], [name] [name]. 10/27/2022 9:15 A [name] Medical Record Number: [id number] Patient Account Number: [id number] Date of Birth/Sex: Treating RN: [birth year] (52 y.o. [name] [name] [name] [name]: [name] [name] Other Clinician: Referring Sian Rockers: Treating Karelly Dewalt/Extender: [name] [name] in Treatment: 68 Clinic Level of [name] Assessment Items TOOL 4 Quantity Score X- 1 0 Use when only an EandM is  performed on FOLLOW-UP visit ASSESSMENTS - Nursing Assessment / Reassessment []  - 0 Reassessment of Co-morbidities (includes updates in patient status) X- 1 5 Reassessment of Adherence to Treatment Plan ASSESSMENTS - Wound and Skin A ssessment / Reassessment X - Simple Wound Assessment / Reassessment - one wound 1 5 []  - 0 Complex Wound Assessment / Reassessment - multiple wounds []  - 0 Dermatologic / Skin Assessment (not related to wound area) ASSESSMENTS - Focused Assessment []  - 0 Circumferential Edema Measurements - multi extremities []  - 0 Nutritional Assessment / Counseling / Intervention []  - 0 Lower Extremity Assessment (monofilament, tuning fork, pulses) [name] [name], [name] [name] ([id number]) [id number] Page 2 of 8 []  - 0 Peripheral Arterial Disease Assessment (using hand held doppler) ASSESSMENTS - Ostomy and/or Continence Assessment and [name] []  - 0 Incontinence Assessment and Management []  - 0 Ostomy [name] Assessment and Management (repouching, etc.) PROCESS - Coordination of [name] X - Simple Patient / Family Education for ongoing [name] 1 15 []  - 0 Complex (extensive) Patient / Family Education for ongoing [name] X- 1 10 Staff obtains Chiropractor, Records, T Results / Process Orders est []  - 0 Staff telephones HHA, Nursing Homes / Clarify orders / etc []  - 0 Routine Transfer to another Facility (non-emergent condition) []  - 0 Routine Hospital Admission (non-emergent condition) []  - 0 New Admissions / Manufacturing engineer / Ordering NPWT Apligraf, etc. , []  - 0 Emergency Hospital Admission (emergent condition) X- 1 10 Simple Discharge Coordination []  - 0 Complex (extensive) Discharge Coordination PROCESS - Special Needs []  - 0 Pediatric / Minor Patient Management []  - 0 Isolation Patient Management []  - 0 Hearing / Language / Visual special needs []  - 0 Assessment of Community assistance (transportation, D/C planning, etc.) []  -  0 Additional assistance / Altered mentation []  - 0 Support Surface(s) Assessment (bed, cushion, seat, etc.) INTERVENTIONS - Wound Cleansing / Measurement []  - 0 Simple Wound Cleansing - one wound []  - 0  Amount: None Present Foul Odor After Cleansing: No Slough/Fibrino No Wound Bed Granulation Amount: None Present (0%) Exposed Structure Necrotic Amount: None Present (0%) Fascia Exposed: No Fat Layer (Subcutaneous Tissue) Exposed: No Tendon Exposed: No Muscle Exposed: No Joint Exposed: No Bone Exposed: No Periwound Skin  Texture Texture Color No Abnormalities Noted: No No Abnormalities Noted: Yes Callus: Yes Temperature / Pain Temperature: No Abnormality Moisture No Abnormalities Noted: No Dry / Scaly: Yes Maceration: No Electronic Signature(s) Signed: 10/27/2022 4:26:21 PM By: [name] Entered By: [name] on 10/27/2022 06:14:02 -------------------------------------------------------------------------------- Vitals Details Patient Name: Date of Service: [name], [name] [name]. 10/27/2022 9:15 A [name] Medical Record Number: [id number] Patient Account Number: [id number] Date of Birth/Sex: Treating RN: [name] (52 y.o. [name]) Primary [name] Chiyoko Torrico: [name] [name] Other Clinician: Referring Towanna Avery: Treating Anntionette Madkins/Extender: [name] [name] in Treatment: 68 Vital Signs Time Taken: 09:07 Temperature (F): 98.1 Height (in): 72 Pulse (bpm): 80 Weight (lbs): 312 Respiratory Rate (breaths/min): 18 Body Mass Index (BMI): 42.3 Blood Pressure (mmHg): 145/87 Capillary Blood Glucose (mg/dl): 409 Reference Range: 80 - 120 mg / dl [name] [name], [name] [name] ([id number]) [phone].pdf Page 8 of 8 Electronic Signature(s) Signed: 10/27/2022 9:35:04 AM By: [name] [name] Entered By: [name] [name] on 10/27/2022 06:08:20  Acquired: 24 N/A N/A Weeks of Treatment: Healed - Epithelialized N/A N/A Wound Status: No N/A N/A Wound Recurrence: 0x0x0 N/A N/A Measurements L x W x D (cm) 0 N/A N/A A (cm) : rea 0 N/A N/A Volume (cm) : 100.00% N/A N/A % Reduction in A rea: 100.00% N/A N/A % Reduction in Volume: Grade 2 N/A N/A Classification: None Present N/A N/A Exudate A mount: Flat and Intact N/A N/A Wound Margin: None Present (0%) N/A N/A Granulation A mount: None Present (0%) N/A N/A Necrotic A mount: Fascia: No N/A N/A Exposed Structures: Fat Layer (Subcutaneous Tissue): No Tendon: No Muscle: No Joint: No Bone: No Large (67-100%) N/A N/A Epithelialization: Callus: Yes N/A N/A Periwound Skin Texture: Dry/Scaly: Yes N/A N/A Periwound Skin Moisture: Maceration: No No Abnormalities Noted N/A N/A Periwound Skin Color: No Abnormality N/A N/A Temperature: Treatment Notes [name] [name], [name] [name] ([id number]) [id number] Page 5 of 8 Electronic Signature(s) Signed: 10/27/2022 9:44:24 AM By: Duanne Guess MD FACS Entered By: Duanne Guess on 10/27/2022  06:44:24 -------------------------------------------------------------------------------- Multi-Disciplinary [name] Plan Details Patient Name: Date of Service: [name], [name] [name]. 10/27/2022 9:15 A [name] Medical Record Number: [id number] Patient Account Number: [id number] Date of Birth/Sex: Treating RN: [name] (52 y.o. [name] [name] Venezia Sargeant: [name] [name] Other Clinician: Referring Maicey Barrientez: Treating Tniyah Nakagawa/Extender: [name] [name] in Treatment: 81 Multidisciplinary [name] Plan reviewed with physician Active Inactive Electronic Signature(s) Signed: 10/27/2022 4:26:21 PM By: [name] Entered By: [name] on 10/27/2022 06:18:53 -------------------------------------------------------------------------------- Pain Assessment Details Patient Name: Date of Service: [name], [name] [name]. 10/27/2022 9:15 A [name] Medical Record Number: [id number] Patient Account Number: [id number] Date of Birth/Sex: Treating RN: [name] (52 y.o. [name]) Primary [name] Admiral Marcucci: [name] [name] Other Clinician: Referring Partick Musselman: Treating Zai Chmiel/Extender: [name] [name] in Treatment: 68 Active Problems Location of Pain Severity and Description of Pain Patient Has Paino No Site Locations Pain Management and Medication Current Pain Management: [name] [name], [name] [name] ([id number]) [id number] Page 6 of 8 Electronic Signature(s) Signed: 10/27/2022 9:35:04 AM By: [name] [name] Entered By: [name] [name] on 10/27/2022 06:08:26 -------------------------------------------------------------------------------- Patient/Caregiver Education Details Patient Name: Date of Service: [name], [name] [name]. 10/7/2024andnbsp9:15 A [name] Medical Record Number: [id number] Patient Account Number: [id number] Date of Birth/Gender: Treating RN: [name] (52 y.o. [name] [name] Physician: [name] [name] Other  Clinician: Referring Physician: Treating Physician/Extender: [name] [name] in Treatment: 33 Education Assessment Education Provided To: Patient Education Topics Provided Wound/Skin Impairment: Methods: Explain/Verbal Responses: Reinforcements needed, State content correctly Electronic Signature(s) Signed: 10/27/2022 4:26:21 PM By: [name] Entered By: [name] on 10/27/2022 06:19:04 -------------------------------------------------------------------------------- Wound Assessment Details Patient Name: Date of Service: [name], [name] [name]. 10/27/2022 9:15 A [name] Medical Record Number: [id number] Patient Account Number: [id number] Date of Birth/Sex: Treating RN: [name] (52 y.o. [name]) Primary [name] Mikell Kazlauskas: [name] [name] Other Clinician: Referring Veleda Mun: Treating Aaradhya Kysar/Extender: [name] [name] in Treatment: 68 Wound Status Wound Number: 4 Primary Etiology: Diabetic Wound/Ulcer of the Lower Extremity Wound Location: Right, Distal, Lateral Foot Wound Status: Healed - Epithelialized Wounding Event: Blister Comorbid History: Type II Diabetes Date Acquired: 05/06/2022 [facility] Of Treatment: 24 Clustered Wound: No Photos [name] [name], [name] [name] ([id number]) [id number] Page 7 of 8 Wound Measurements Length: (cm) Width: (cm) Depth: (cm) Area: (cm) Volume: (cm) 0 % Reduction in Area: 100% 0 % Reduction in Volume: 100% 0 Epithelialization: Large (67-100%) 0 Tunneling: No 0 Undermining: No Wound Description Classification: Grade 2 Wound Margin: Flat and Intact Exudate

## 2022-10-27 NOTE — Progress Notes (Signed)
06/23/2022: The wound is a little bit smaller today. There is some undermining created by accumulated callus, but underneath the callus, there is epithelialization. Minimal slough on the surface. He has been approved for Epicord, so we will apply that today. He seems to be getting adequate offloading using the peg assist insert in his shoe. 06/30/2022: The wound is smaller today. There is no undermining. The wound surface looks healthier. Still with some periwound callus accumulation. 07/07/2022: The wound is about the same size. The center of the wound is fairly soft and mushy, however. 07/21/2022: He was out of town last week and apparently did quite a bit more walking than he had anticipated. As a result, opening the wound is smaller but he has developed significant undermining, roughly 1.5 cm nearly circumferentially. There is no malodor or purulent drainage. There is heavy callus  accumulation around the wound. 07/28/2022: There has been further breakdown and undermining that has occurred. The muscle is now exposed but there does not appear to be any evidence of necrosis. No malodor or purulent drainage. 08/04/2022: The culture that I took last week grew out a polymicrobial population and doxycycline was the recommended antibiotic for coverage. This was prescribed and he began taking it on Thursday. No real change to his wound. 08/11/22: Measured smaller, still with circumferential undermining. 08/18/2022: The wound measured slightly smaller. There is still circumferential undermining, but the area at 12:00 is not as deep as 6-9 o'clock. He had a bit more drainage this week and the periwound is slightly macerated. The drainage appears serosanguineous. 08/25/2022: The wound is about the same. He still has quite a bit of undermining. He also reports having had more pain this week, along with fairly substantial drainage. 10/20/2022: He returns to clinic after having undergone surgery with Dr. Allena Katz on August 15. A right foot excisional debridement of his wound along with exostectomy of the fifth metatarsal base was performed. He has been following in Dr. Eliane Decree office since his surgical procedure. 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 [name] felt like he wanted to have another evaluation in our clinic to make sure that I agreed; he does have an additional follow-up scheduled in Dr. Eliane Decree office on October 25. 10/27/2022: His wound is healed. Electronic Signature(s) Signed: 10/27/2022 9:44:58 AM By: Duanne Guess MD FACS Entered By: Duanne Guess on 10/27/2022 09:44:58 Physical Exam Details -------------------------------------------------------------------------------- [name], [name] ([id number]) [id number] Page 4 of 10 Patient Name: Date of Service: [name], [name] 10/27/2022 9:15 [name] Medical  Record Number: [id number] Patient Account Number: [id number] Date of Birth/Sex: Treating RN: September 30, 1970 (53 y.o. [name] Primary Care Provider: Tana Conch Other Clinician: Referring Provider: Treating Provider/Extender: Arvin Collard in Treatment: 68 Constitutional Slightly hypertensive. . . . no acute distress. Respiratory Normal work of breathing on room air. Notes 10/27/2022: His wound is healed. Electronic Signature(s) Signed: 10/27/2022 9:45:33 AM By: Duanne Guess MD FACS Entered By: Duanne Guess on 10/27/2022 09:45:33 -------------------------------------------------------------------------------- Physician Orders Details Patient Name: Date of Service: [name], [name] 10/27/2022 9:15 [name] Medical Record Number: [id number] Patient Account Number: [id number] Date of Birth/Sex: Treating RN: 1970/12/02 (52 y.o. [name] Primary Care Provider: Tana Conch Other Clinician: Referring Provider: Treating Provider/Extender: Arvin Collard in Treatment: 68 The following information was scribed by: [name] The information was scribed for: Duanne Guess Verbal / Phone Orders: No Diagnosis Coding ICD-10 Coding Code Description [phone]  on October 25. 10/27/2022: His wound is healed. Patient History Information obtained from Patient. Social History Never smoker, Marital Status - Married, Alcohol Use - Never, Drug Use - No History, Caffeine Use - Daily - T coffee. ea; Medical History Endocrine Patient has history of Type II Diabetes Hospitalization/Surgery History - Amuptation of 3rd,4th and 5th toes of Right foot;Oral Surgery;Anal Fissure surgery; Cholecystectomy. Objective Constitutional Slightly hypertensive. no acute distress. Vitals Time Taken: 9:07 AM, Height: 72 in, Weight: 312 lbs, BMI: 42.3, Temperature: 98.1 F, Pulse: 80 bpm, Respiratory Rate: 18 breaths/min, Blood Pressure: 145/87 mmHg, Capillary Blood Glucose: 134 mg/dl. Respiratory Normal work of breathing on room air. General Notes: 10/27/2022: His wound is healed. Integumentary (Hair, Skin) Wound #4 status is Healed - Epithelialized. Original cause of wound was Blister. The date acquired was: 05/06/2022. The wound has been in treatment 24 weeks. The wound is located on the Right,Distal,Lateral Foot. The wound measures 0cm length x 0cm width x 0cm depth; 0cm^2 area and 0cm^3 volume. There is no tunneling or undermining noted. There is a none present amount of drainage noted. The wound margin is flat and intact. There is no granulation within the wound bed. There is no necrotic tissue within the wound bed. The periwound skin appearance had no abnormalities noted for color. The periwound skin  appearance exhibited: Callus, Dry/Scaly. The periwound skin appearance did not exhibit: Maceration. Periwound temperature was noted as No Abnormality. 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 Anesthetic: (In clinic) Topical Lidocaine 4% applied to wound bed The following medication(s) was prescribed: lidocaine topical 4 % cream cream topical was prescribed at facility 10/27/2022: His wound is healed. He will still follow-up with Dr. Allena Katz on October 25, as scheduled. 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 Doing so has alleviated the point depression. I will defer to podiatry regarding any special insoles inserts or orthotics. We will discharge him from the wound care center. He may follow-up in the future if needed. Electronic Signature(s) Signed: 10/27/2022 9:47:19 AM By: Duanne Guess MD FACS Entered By: Duanne Guess on 10/27/2022 09:47:19 -------------------------------------------------------------------------------- HxROS Details Patient Name: Date of Service: [name], [name] 10/27/2022 9:15 [name] Medical Record Number: [id number] Patient Account Number: [id number] Date of Birth/Sex: Treating RN: [birth year] (52 y.o. [name] Primary Care Provider: Tana Conch Other Clinician: Referring Provider: Treating Provider/Extender: Arvin Collard in Treatment: 68 Information Obtained From Patient Endocrine Medical History: Positive for: Type II  Diabetes Immunizations Pneumococcal Vaccine: Received Pneumococcal Vaccination: Yes Received Pneumococcal Vaccination On or After 60th Birthday: No Implantable Devices None Hospitalization / Surgery History Type of Hospitalization/Surgery Amuptation of 3rd,4th and 5th toes of Right foot;Oral Surgery;Anal Fissure surgery; Cholecystectomy Family and Social History Never smoker; Marital Status - Married; Alcohol Use: Never; Drug Use: No History; Caffeine Use: Daily - T coffee; Financial Concerns: No; Food, ea; Clothing or Shelter Needs: No; Support System Lacking: No; Transportation Concerns: No Electronic Signature(s) Signed: 10/27/2022 10:35:48 AM By: Duanne Guess MD FACS Entered By: Duanne Guess on 10/27/2022 09:45:07 -------------------------------------------------------------------------------- SuperBill Details Patient Name: Date of Service: [name],  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 Anesthetic (In clinic) Topical Lidocaine 4% applied to wound bed Patient Medications llergies: No Known Allergies A Notifications Medication Indication Start End 10/27/2022 lidocaine DOSE topical 4 % cream - cream topical Electronic Signature(s) Signed: 10/27/2022  9:45:48 AM By: Duanne Guess MD FACS Entered By: Duanne Guess on 10/27/2022 09:45:47 [name], [name] ([id number]) [id number] Page 5 of 10 -------------------------------------------------------------------------------- Problem List Details Patient Name: Date of Service: [name], [name] 10/27/2022 9:15 [name] Medical Record Number: [id number] Patient Account Number: [id number] Date of Birth/Sex: Treating RN: May 18, 1970 (52 y.o. 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 05/06/2022 No Yes without evidence of necrosis E11.621 Type 2 diabetes mellitus with foot ulcer 07/04/2021 No Yes L84 Corns and callosities 10/20/2022 No Yes E11.49 Type 2 diabetes mellitus with other diabetic neurological complication 07/04/2021 No Yes M86.671 Other chronic osteomyelitis, right ankle and foot 07/04/2021 No Yes Inactive Problems Resolved Problems ICD-10 Code Description Active Date Resolved Date L97.514 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 10/27/2022 9:15 [name] Medical Record Number: [id number] Patient Account Number: [id number] Date of Birth/Sex: Treating RN: 10/18/1970 (52 y.o. 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 The most recent amputation was in August 2022. At that operation, antibiotic beads were placed in the wound. He has been managed by podiatry for his procedures and management of his wounds. He has been in a Scientist, water quality. He is on oral doxycycline. They have been using Betadine wet to dry dressings along with Iodosorb. The patient states that when he thinks the wound is getting too dry, he applies topical Neosporin. At his last visit, on June 7 of this year, the podiatrist determined that he felt the wound was stalled and referred him to wound care for additional evaluation and management. An MRI has been ordered, but not yet scheduled or performed. Pathology from his operation in August 2022 demonstrated findings consistent with chronic osteomyelitis. Today, there is a large irregular wound on the plantar surface of his right foot, at about the level of the fifth metatarsal head. This tracks through to a pinpoint opening on the dorsal lateral portion of his foot. The intake nurse reported purulent drainage. There is some malodor from the wound. No frank necrosis identified. 07/11/2021: Today, the wounds do not connect. 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 Anesthetic (In clinic) Topical Lidocaine 4% applied to wound bed Patient Medications llergies: No Known Allergies A Notifications Medication Indication Start End 10/27/2022 lidocaine DOSE topical 4 % cream - cream topical Electronic Signature(s) Signed: 10/27/2022  9:45:48 AM By: Duanne Guess MD FACS Entered By: Duanne Guess on 10/27/2022 09:45:47 [name], [name] ([id number]) [id number] Page 5 of 10 -------------------------------------------------------------------------------- Problem List Details Patient Name: Date of Service: [name], [name] 10/27/2022 9:15 [name] Medical Record Number: [id number] Patient Account Number: [id number] Date of Birth/Sex: Treating RN: May 18, 1970 (52 y.o. 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 05/06/2022 No Yes without evidence of necrosis E11.621 Type 2 diabetes mellitus with foot ulcer 07/04/2021 No Yes L84 Corns and callosities 10/20/2022 No Yes E11.49 Type 2 diabetes mellitus with other diabetic neurological complication 07/04/2021 No Yes M86.671 Other chronic osteomyelitis, right ankle and foot 07/04/2021 No Yes Inactive Problems Resolved Problems ICD-10 Code Description Active Date Resolved Date L97.514 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 10/27/2022 9:15 [name] Medical Record Number: [id number] Patient Account Number: [id number] Date of Birth/Sex: Treating RN: 10/18/1970 (52 y.o. 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 The most recent amputation was in August 2022. At that operation, antibiotic beads were placed in the wound. He has been managed by podiatry for his procedures and management of his wounds. He has been in a Scientist, water quality. He is on oral doxycycline. They have been using Betadine wet to dry dressings along with Iodosorb. The patient states that when he thinks the wound is getting too dry, he applies topical Neosporin. At his last visit, on June 7 of this year, the podiatrist determined that he felt the wound was stalled and referred him to wound care for additional evaluation and management. An MRI has been ordered, but not yet scheduled or performed. Pathology from his operation in August 2022 demonstrated findings consistent with chronic osteomyelitis. Today, there is a large irregular wound on the plantar surface of his right foot, at about the level of the fifth metatarsal head. This tracks through to a pinpoint opening on the dorsal lateral portion of his foot. The intake nurse reported purulent drainage. There is some malodor from the wound. No frank necrosis identified. 07/11/2021: Today, the wounds do not connect. I attempted  [name], [name] ([id number]) [id number] Page 1 of 10 Visit Report for 10/27/2022 Chief Complaint Document Details Patient Name: Date of Service: [name], [name] 10/27/2022 9:15 [name] Medical Record Number: [id number] Patient Account Number: [id number] Date of Birth/Sex: Treating RN: 12-03-1970 (52 y.o. [name] Primary Care Provider: Tana Conch Other Clinician: Referring Provider: Treating Provider/Extender: Arvin Collard in Treatment: 68 Information Obtained from: Patient Chief Complaint Patients presents for treatment of an open diabetic ulcer Electronic Signature(s) Signed: 10/27/2022 9:44:43 AM By: Duanne Guess MD FACS Entered By: Duanne Guess on 10/27/2022 09:44:43 -------------------------------------------------------------------------------- HPI Details Patient Name: Date of Service: [name], [name] 10/27/2022 9:15 [name] Medical Record Number: [id number] Patient Account Number: [id number] Date of Birth/Sex: Treating RN: 1970-08-11 (52 y.o. 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 The most recent amputation was in August 2022. At that operation, antibiotic beads were placed in the wound. He has been managed by podiatry for his procedures and management of his wounds. He has been in a Scientist, water quality. He is on oral doxycycline. They have been using Betadine wet to dry dressings along with Iodosorb. The patient states that when he thinks the wound is getting too dry, he applies topical Neosporin. At his last visit, on June 7 of this year, the podiatrist  determined that he felt the wound was stalled and referred him to wound care for additional evaluation and management. An MRI has been ordered, but not yet scheduled or performed. Pathology from his operation in August 2022 demonstrated findings consistent with chronic osteomyelitis. Today, there is a large irregular wound on the plantar surface of his right foot, at about the level of the fifth metatarsal head. This tracks through to a pinpoint opening on the dorsal lateral portion of his foot. The intake nurse reported purulent drainage. There is some malodor from the wound. No frank necrosis identified. 07/11/2021: Today, the wounds do not connect. I attempted multiple times from various directions and the shared tunnel is no longer open. He has some slough accumulation on the dorsal part of his foot as well as slough and callus buildup on the plantar surface. His MRI is scheduled for June 29. No purulent drainage or malodor appreciated today. 07/19/2021: The lateral foot wound has closed and there is no tunnel connecting the plantar foot wound to that site. The plantar foot wound still probes quite deeply, however. There is some slough, eschar, and nonviable tissue accumulated in the wound bed. No malodorous drainage present. His MRI was performed yesterday and is consistent with fairly extensive osteomyelitis. 07/29/2021: The patient has an appointment with infectious disease on July 18 to treat his osteomyelitis. The plantar wound still probes quite deeply, approaching bone. The orifice has narrowed quite substantially, however, making it more difficult for him to pack. 08/05/2021: The tunnel connecting the lateral foot wound to the plantar foot wound has reopened. He sees infectious disease tomorrow to discuss long-term antibiotic treatment for osteomyelitis. There was a bit of murky drainage in the wound, but this was noted after he had had topical lidocaine applied so may have just been a blob  of the anesthetic. No odor or frank purulent drainage.  06/23/2022: The wound is a little bit smaller today. There is some undermining created by accumulated callus, but underneath the callus, there is epithelialization. Minimal slough on the surface. He has been approved for Epicord, so we will apply that today. He seems to be getting adequate offloading using the peg assist insert in his shoe. 06/30/2022: The wound is smaller today. There is no undermining. The wound surface looks healthier. Still with some periwound callus accumulation. 07/07/2022: The wound is about the same size. The center of the wound is fairly soft and mushy, however. 07/21/2022: He was out of town last week and apparently did quite a bit more walking than he had anticipated. As a result, opening the wound is smaller but he has developed significant undermining, roughly 1.5 cm nearly circumferentially. There is no malodor or purulent drainage. There is heavy callus  accumulation around the wound. 07/28/2022: There has been further breakdown and undermining that has occurred. The muscle is now exposed but there does not appear to be any evidence of necrosis. No malodor or purulent drainage. 08/04/2022: The culture that I took last week grew out a polymicrobial population and doxycycline was the recommended antibiotic for coverage. This was prescribed and he began taking it on Thursday. No real change to his wound. 08/11/22: Measured smaller, still with circumferential undermining. 08/18/2022: The wound measured slightly smaller. There is still circumferential undermining, but the area at 12:00 is not as deep as 6-9 o'clock. He had a bit more drainage this week and the periwound is slightly macerated. The drainage appears serosanguineous. 08/25/2022: The wound is about the same. He still has quite a bit of undermining. He also reports having had more pain this week, along with fairly substantial drainage. 10/20/2022: He returns to clinic after having undergone surgery with Dr. Allena Katz on August 15. A right foot excisional debridement of his wound along with exostectomy of the fifth metatarsal base was performed. He has been following in Dr. Eliane Decree office since his surgical procedure. 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 [name] felt like he wanted to have another evaluation in our clinic to make sure that I agreed; he does have an additional follow-up scheduled in Dr. Eliane Decree office on October 25. 10/27/2022: His wound is healed. Electronic Signature(s) Signed: 10/27/2022 9:44:58 AM By: Duanne Guess MD FACS Entered By: Duanne Guess on 10/27/2022 09:44:58 Physical Exam Details -------------------------------------------------------------------------------- [name], [name] ([id number]) [id number] Page 4 of 10 Patient Name: Date of Service: [name], [name] 10/27/2022 9:15 [name] Medical  Record Number: [id number] Patient Account Number: [id number] Date of Birth/Sex: Treating RN: September 30, 1970 (53 y.o. [name] Primary Care Provider: Tana Conch Other Clinician: Referring Provider: Treating Provider/Extender: Arvin Collard in Treatment: 68 Constitutional Slightly hypertensive. . . . no acute distress. Respiratory Normal work of breathing on room air. Notes 10/27/2022: His wound is healed. Electronic Signature(s) Signed: 10/27/2022 9:45:33 AM By: Duanne Guess MD FACS Entered By: Duanne Guess on 10/27/2022 09:45:33 -------------------------------------------------------------------------------- Physician Orders Details Patient Name: Date of Service: [name], [name] 10/27/2022 9:15 [name] Medical Record Number: [id number] Patient Account Number: [id number] Date of Birth/Sex: Treating RN: 1970/12/02 (52 y.o. [name] Primary Care Provider: Tana Conch Other Clinician: Referring Provider: Treating Provider/Extender: Arvin Collard in Treatment: 68 The following information was scribed by: [name] The information was scribed for: Duanne Guess Verbal / Phone Orders: No Diagnosis Coding ICD-10 Coding Code Description [phone]  06/23/2022: The wound is a little bit smaller today. There is some undermining created by accumulated callus, but underneath the callus, there is epithelialization. Minimal slough on the surface. He has been approved for Epicord, so we will apply that today. He seems to be getting adequate offloading using the peg assist insert in his shoe. 06/30/2022: The wound is smaller today. There is no undermining. The wound surface looks healthier. Still with some periwound callus accumulation. 07/07/2022: The wound is about the same size. The center of the wound is fairly soft and mushy, however. 07/21/2022: He was out of town last week and apparently did quite a bit more walking than he had anticipated. As a result, opening the wound is smaller but he has developed significant undermining, roughly 1.5 cm nearly circumferentially. There is no malodor or purulent drainage. There is heavy callus  accumulation around the wound. 07/28/2022: There has been further breakdown and undermining that has occurred. The muscle is now exposed but there does not appear to be any evidence of necrosis. No malodor or purulent drainage. 08/04/2022: The culture that I took last week grew out a polymicrobial population and doxycycline was the recommended antibiotic for coverage. This was prescribed and he began taking it on Thursday. No real change to his wound. 08/11/22: Measured smaller, still with circumferential undermining. 08/18/2022: The wound measured slightly smaller. There is still circumferential undermining, but the area at 12:00 is not as deep as 6-9 o'clock. He had a bit more drainage this week and the periwound is slightly macerated. The drainage appears serosanguineous. 08/25/2022: The wound is about the same. He still has quite a bit of undermining. He also reports having had more pain this week, along with fairly substantial drainage. 10/20/2022: He returns to clinic after having undergone surgery with Dr. Allena Katz on August 15. A right foot excisional debridement of his wound along with exostectomy of the fifth metatarsal base was performed. He has been following in Dr. Eliane Decree office since his surgical procedure. 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 [name] felt like he wanted to have another evaluation in our clinic to make sure that I agreed; he does have an additional follow-up scheduled in Dr. Eliane Decree office on October 25. 10/27/2022: His wound is healed. Electronic Signature(s) Signed: 10/27/2022 9:44:58 AM By: Duanne Guess MD FACS Entered By: Duanne Guess on 10/27/2022 09:44:58 Physical Exam Details -------------------------------------------------------------------------------- [name], [name] ([id number]) [id number] Page 4 of 10 Patient Name: Date of Service: [name], [name] 10/27/2022 9:15 [name] Medical  Record Number: [id number] Patient Account Number: [id number] Date of Birth/Sex: Treating RN: September 30, 1970 (53 y.o. [name] Primary Care Provider: Tana Conch Other Clinician: Referring Provider: Treating Provider/Extender: Arvin Collard in Treatment: 68 Constitutional Slightly hypertensive. . . . no acute distress. Respiratory Normal work of breathing on room air. Notes 10/27/2022: His wound is healed. Electronic Signature(s) Signed: 10/27/2022 9:45:33 AM By: Duanne Guess MD FACS Entered By: Duanne Guess on 10/27/2022 09:45:33 -------------------------------------------------------------------------------- Physician Orders Details Patient Name: Date of Service: [name], [name] 10/27/2022 9:15 [name] Medical Record Number: [id number] Patient Account Number: [id number] Date of Birth/Sex: Treating RN: 1970/12/02 (52 y.o. [name] Primary Care Provider: Tana Conch Other Clinician: Referring Provider: Treating Provider/Extender: Arvin Collard in Treatment: 68 The following information was scribed by: [name] The information was scribed for: Duanne Guess Verbal / Phone Orders: No Diagnosis Coding ICD-10 Coding Code Description [phone]  [name] 10/27/2022 [name], [name] [id number] Page 10 of 10 Medical Record Number: [id number] Patient Account Number: [id number] Date of Birth/Sex: Treating RN: May 12, 1970 (52 y.o. 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 E11.621 Type 2 diabetes mellitus with foot ulcer L84 Corns and callosities E11.49 Type 2 diabetes mellitus with other diabetic neurological complication M86.671 Other chronic osteomyelitis, right ankle and foot Facility Procedures : CPT4 Code: [id number] Description: 84166 - WOUND CARE VISIT-LEV 2 EST PT Modifier: Quantity: 1 Physician Procedures : CPT4 Code Description Modifier [id number] - WC PHYS LEVEL 3 - EST PT ICD-10 Diagnosis Description L97.515  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 10/27/2022: His wound is healed. Patient History Information obtained from Patient. Social History Never smoker, Marital Status - Married, Alcohol Use - Never, Drug Use - No History, Caffeine Use - Daily - T coffee. ea; Medical History Endocrine Patient has history of Type II Diabetes Hospitalization/Surgery History - Amuptation of 3rd,4th and 5th toes of Right foot;Oral Surgery;Anal Fissure surgery; Cholecystectomy. Objective Constitutional Slightly hypertensive. no acute distress. Vitals Time Taken: 9:07 AM, Height: 72 in, Weight: 312 lbs, BMI: 42.3, Temperature: 98.1 F, Pulse: 80 bpm, Respiratory Rate: 18 breaths/min, Blood Pressure: 145/87 mmHg, Capillary Blood Glucose: 134 mg/dl. Respiratory Normal work of breathing on room air. General Notes: 10/27/2022: His wound is healed. Integumentary (Hair, Skin) Wound #4 status is Healed - Epithelialized. Original cause of wound was Blister. The date acquired was: 05/06/2022. The wound has been in treatment 24 weeks. The wound is located on the Right,Distal,Lateral Foot. The wound measures 0cm length x 0cm width x 0cm depth; 0cm^2 area and 0cm^3 volume. There is no tunneling or undermining noted. There is a none present amount of drainage noted. The wound margin is flat and intact. There is no granulation within the wound bed. There is no necrotic tissue within the wound bed. The periwound skin appearance had no abnormalities noted for color. The periwound skin  appearance exhibited: Callus, Dry/Scaly. The periwound skin appearance did not exhibit: Maceration. Periwound temperature was noted as No Abnormality. 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 Anesthetic: (In clinic) Topical Lidocaine 4% applied to wound bed The following medication(s) was prescribed: lidocaine topical 4 % cream cream topical was prescribed at facility 10/27/2022: His wound is healed. He will still follow-up with Dr. Allena Katz on October 25, as scheduled. 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 Doing so has alleviated the point depression. I will defer to podiatry regarding any special insoles inserts or orthotics. We will discharge him from the wound care center. He may follow-up in the future if needed. Electronic Signature(s) Signed: 10/27/2022 9:47:19 AM By: Duanne Guess MD FACS Entered By: Duanne Guess on 10/27/2022 09:47:19 -------------------------------------------------------------------------------- HxROS Details Patient Name: Date of Service: [name], [name] 10/27/2022 9:15 [name] Medical Record Number: [id number] Patient Account Number: [id number] Date of Birth/Sex: Treating RN: [birth year] (52 y.o. [name] Primary Care Provider: Tana Conch Other Clinician: Referring Provider: Treating Provider/Extender: Arvin Collard in Treatment: 68 Information Obtained From Patient Endocrine Medical History: Positive for: Type II  Diabetes Immunizations Pneumococcal Vaccine: Received Pneumococcal Vaccination: Yes Received Pneumococcal Vaccination On or After 60th Birthday: No Implantable Devices None Hospitalization / Surgery History Type of Hospitalization/Surgery Amuptation of 3rd,4th and 5th toes of Right foot;Oral Surgery;Anal Fissure surgery; Cholecystectomy Family and Social History Never smoker; Marital Status - Married; Alcohol Use: Never; Drug Use: No History; Caffeine Use: Daily - T coffee; Financial Concerns: No; Food, ea; Clothing or Shelter Needs: No; Support System Lacking: No; Transportation Concerns: No Electronic Signature(s) Signed: 10/27/2022 10:35:48 AM By: Duanne Guess MD FACS Entered By: Duanne Guess on 10/27/2022 09:45:07 -------------------------------------------------------------------------------- SuperBill Details Patient Name: Date of Service: [name],  [name], [name] ([id number]) [id number] Page 1 of 10 Visit Report for 10/27/2022 Chief Complaint Document Details Patient Name: Date of Service: [name], [name] 10/27/2022 9:15 [name] Medical Record Number: [id number] Patient Account Number: [id number] Date of Birth/Sex: Treating RN: 12-03-1970 (52 y.o. [name] Primary Care Provider: Tana Conch Other Clinician: Referring Provider: Treating Provider/Extender: Arvin Collard in Treatment: 68 Information Obtained from: Patient Chief Complaint Patients presents for treatment of an open diabetic ulcer Electronic Signature(s) Signed: 10/27/2022 9:44:43 AM By: Duanne Guess MD FACS Entered By: Duanne Guess on 10/27/2022 09:44:43 -------------------------------------------------------------------------------- HPI Details Patient Name: Date of Service: [name], [name] 10/27/2022 9:15 [name] Medical Record Number: [id number] Patient Account Number: [id number] Date of Birth/Sex: Treating RN: 1970-08-11 (52 y.o. 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 The most recent amputation was in August 2022. At that operation, antibiotic beads were placed in the wound. He has been managed by podiatry for his procedures and management of his wounds. He has been in a Scientist, water quality. He is on oral doxycycline. They have been using Betadine wet to dry dressings along with Iodosorb. The patient states that when he thinks the wound is getting too dry, he applies topical Neosporin. At his last visit, on June 7 of this year, the podiatrist  determined that he felt the wound was stalled and referred him to wound care for additional evaluation and management. An MRI has been ordered, but not yet scheduled or performed. Pathology from his operation in August 2022 demonstrated findings consistent with chronic osteomyelitis. Today, there is a large irregular wound on the plantar surface of his right foot, at about the level of the fifth metatarsal head. This tracks through to a pinpoint opening on the dorsal lateral portion of his foot. The intake nurse reported purulent drainage. There is some malodor from the wound. No frank necrosis identified. 07/11/2021: Today, the wounds do not connect. I attempted multiple times from various directions and the shared tunnel is no longer open. He has some slough accumulation on the dorsal part of his foot as well as slough and callus buildup on the plantar surface. His MRI is scheduled for June 29. No purulent drainage or malodor appreciated today. 07/19/2021: The lateral foot wound has closed and there is no tunnel connecting the plantar foot wound to that site. The plantar foot wound still probes quite deeply, however. There is some slough, eschar, and nonviable tissue accumulated in the wound bed. No malodorous drainage present. His MRI was performed yesterday and is consistent with fairly extensive osteomyelitis. 07/29/2021: The patient has an appointment with infectious disease on July 18 to treat his osteomyelitis. The plantar wound still probes quite deeply, approaching bone. The orifice has narrowed quite substantially, however, making it more difficult for him to pack. 08/05/2021: The tunnel connecting the lateral foot wound to the plantar foot wound has reopened. He sees infectious disease tomorrow to discuss long-term antibiotic treatment for osteomyelitis. There was a bit of murky drainage in the wound, but this was noted after he had had topical lidocaine applied so may have just been a blob  of the anesthetic. No odor or frank purulent drainage.  on October 25. 10/27/2022: His wound is healed. Patient History Information obtained from Patient. Social History Never smoker, Marital Status - Married, Alcohol Use - Never, Drug Use - No History, Caffeine Use - Daily - T coffee. ea; Medical History Endocrine Patient has history of Type II Diabetes Hospitalization/Surgery History - Amuptation of 3rd,4th and 5th toes of Right foot;Oral Surgery;Anal Fissure surgery; Cholecystectomy. Objective Constitutional Slightly hypertensive. no acute distress. Vitals Time Taken: 9:07 AM, Height: 72 in, Weight: 312 lbs, BMI: 42.3, Temperature: 98.1 F, Pulse: 80 bpm, Respiratory Rate: 18 breaths/min, Blood Pressure: 145/87 mmHg, Capillary Blood Glucose: 134 mg/dl. Respiratory Normal work of breathing on room air. General Notes: 10/27/2022: His wound is healed. Integumentary (Hair, Skin) Wound #4 status is Healed - Epithelialized. Original cause of wound was Blister. The date acquired was: 05/06/2022. The wound has been in treatment 24 weeks. The wound is located on the Right,Distal,Lateral Foot. The wound measures 0cm length x 0cm width x 0cm depth; 0cm^2 area and 0cm^3 volume. There is no tunneling or undermining noted. There is a none present amount of drainage noted. The wound margin is flat and intact. There is no granulation within the wound bed. There is no necrotic tissue within the wound bed. The periwound skin appearance had no abnormalities noted for color. The periwound skin  appearance exhibited: Callus, Dry/Scaly. The periwound skin appearance did not exhibit: Maceration. Periwound temperature was noted as No Abnormality. 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 Anesthetic: (In clinic) Topical Lidocaine 4% applied to wound bed The following medication(s) was prescribed: lidocaine topical 4 % cream cream topical was prescribed at facility 10/27/2022: His wound is healed. He will still follow-up with Dr. Allena Katz on October 25, as scheduled. 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 Doing so has alleviated the point depression. I will defer to podiatry regarding any special insoles inserts or orthotics. We will discharge him from the wound care center. He may follow-up in the future if needed. Electronic Signature(s) Signed: 10/27/2022 9:47:19 AM By: Duanne Guess MD FACS Entered By: Duanne Guess on 10/27/2022 09:47:19 -------------------------------------------------------------------------------- HxROS Details Patient Name: Date of Service: [name], [name] 10/27/2022 9:15 [name] Medical Record Number: [id number] Patient Account Number: [id number] Date of Birth/Sex: Treating RN: [birth year] (52 y.o. [name] Primary Care Provider: Tana Conch Other Clinician: Referring Provider: Treating Provider/Extender: Arvin Collard in Treatment: 68 Information Obtained From Patient Endocrine Medical History: Positive for: Type II  Diabetes Immunizations Pneumococcal Vaccine: Received Pneumococcal Vaccination: Yes Received Pneumococcal Vaccination On or After 60th Birthday: No Implantable Devices None Hospitalization / Surgery History Type of Hospitalization/Surgery Amuptation of 3rd,4th and 5th toes of Right foot;Oral Surgery;Anal Fissure surgery; Cholecystectomy Family and Social History Never smoker; Marital Status - Married; Alcohol Use: Never; Drug Use: No History; Caffeine Use: Daily - T coffee; Financial Concerns: No; Food, ea; Clothing or Shelter Needs: No; Support System Lacking: No; Transportation Concerns: No Electronic Signature(s) Signed: 10/27/2022 10:35:48 AM By: Duanne Guess MD FACS Entered By: Duanne Guess on 10/27/2022 09:45:07 -------------------------------------------------------------------------------- SuperBill Details Patient Name: Date of Service: [name],  on October 25. 10/27/2022: His wound is healed. Patient History Information obtained from Patient. Social History Never smoker, Marital Status - Married, Alcohol Use - Never, Drug Use - No History, Caffeine Use - Daily - T coffee. ea; Medical History Endocrine Patient has history of Type II Diabetes Hospitalization/Surgery History - Amuptation of 3rd,4th and 5th toes of Right foot;Oral Surgery;Anal Fissure surgery; Cholecystectomy. Objective Constitutional Slightly hypertensive. no acute distress. Vitals Time Taken: 9:07 AM, Height: 72 in, Weight: 312 lbs, BMI: 42.3, Temperature: 98.1 F, Pulse: 80 bpm, Respiratory Rate: 18 breaths/min, Blood Pressure: 145/87 mmHg, Capillary Blood Glucose: 134 mg/dl. Respiratory Normal work of breathing on room air. General Notes: 10/27/2022: His wound is healed. Integumentary (Hair, Skin) Wound #4 status is Healed - Epithelialized. Original cause of wound was Blister. The date acquired was: 05/06/2022. The wound has been in treatment 24 weeks. The wound is located on the Right,Distal,Lateral Foot. The wound measures 0cm length x 0cm width x 0cm depth; 0cm^2 area and 0cm^3 volume. There is no tunneling or undermining noted. There is a none present amount of drainage noted. The wound margin is flat and intact. There is no granulation within the wound bed. There is no necrotic tissue within the wound bed. The periwound skin appearance had no abnormalities noted for color. The periwound skin  appearance exhibited: Callus, Dry/Scaly. The periwound skin appearance did not exhibit: Maceration. Periwound temperature was noted as No Abnormality. 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 Anesthetic: (In clinic) Topical Lidocaine 4% applied to wound bed The following medication(s) was prescribed: lidocaine topical 4 % cream cream topical was prescribed at facility 10/27/2022: His wound is healed. He will still follow-up with Dr. Allena Katz on October 25, as scheduled. 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 Doing so has alleviated the point depression. I will defer to podiatry regarding any special insoles inserts or orthotics. We will discharge him from the wound care center. He may follow-up in the future if needed. Electronic Signature(s) Signed: 10/27/2022 9:47:19 AM By: Duanne Guess MD FACS Entered By: Duanne Guess on 10/27/2022 09:47:19 -------------------------------------------------------------------------------- HxROS Details Patient Name: Date of Service: [name], [name] 10/27/2022 9:15 [name] Medical Record Number: [id number] Patient Account Number: [id number] Date of Birth/Sex: Treating RN: [birth year] (52 y.o. [name] Primary Care Provider: Tana Conch Other Clinician: Referring Provider: Treating Provider/Extender: Arvin Collard in Treatment: 68 Information Obtained From Patient Endocrine Medical History: Positive for: Type II  Diabetes Immunizations Pneumococcal Vaccine: Received Pneumococcal Vaccination: Yes Received Pneumococcal Vaccination On or After 60th Birthday: No Implantable Devices None Hospitalization / Surgery History Type of Hospitalization/Surgery Amuptation of 3rd,4th and 5th toes of Right foot;Oral Surgery;Anal Fissure surgery; Cholecystectomy Family and Social History Never smoker; Marital Status - Married; Alcohol Use: Never; Drug Use: No History; Caffeine Use: Daily - T coffee; Financial Concerns: No; Food, ea; Clothing or Shelter Needs: No; Support System Lacking: No; Transportation Concerns: No Electronic Signature(s) Signed: 10/27/2022 10:35:48 AM By: Duanne Guess MD FACS Entered By: Duanne Guess on 10/27/2022 09:45:07 -------------------------------------------------------------------------------- SuperBill Details Patient Name: Date of Service: [name],

## 2022-11-03 ENCOUNTER — Ambulatory Visit (HOSPITAL_BASED_OUTPATIENT_CLINIC_OR_DEPARTMENT_OTHER): Payer: BLUE CROSS/BLUE SHIELD | Admitting: General Surgery

## 2022-11-14 ENCOUNTER — Ambulatory Visit (INDEPENDENT_AMBULATORY_CARE_PROVIDER_SITE_OTHER): Payer: BLUE CROSS/BLUE SHIELD | Admitting: Podiatry

## 2022-11-14 ENCOUNTER — Encounter: Payer: Self-pay | Admitting: Podiatry

## 2022-11-14 DIAGNOSIS — M898X9 Other specified disorders of bone, unspecified site: Secondary | ICD-10-CM

## 2022-11-14 NOTE — Progress Notes (Signed)
Subjective:  Patient ID: [name], male    DOB: [birth year],  MRN: [id number]  Chief Complaint  Patient presents with   Foot Pain    RM# 7 Post op follow up patient states he is doing well no concerns.    52 y.o. male presents for wound care.  Patient presents with complaint of status post excisional debridement with exostectomy of the fifth metatarsal base.  Patient states that he is doing well denies any other acute complaints.   Review of Systems: Negative except as noted in the HPI. Denies N/V/F/Ch.  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   (06-17-2019 pt stated has been stable with no changes and followed by pcp)   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HCC)    11-06-2016  lower extremity angiography (dr Edilia Bo)  mil tibial disease w/ occlusion of the distal peroneal artery and dorsalis pedis artery but has widely patent posterior tibial and anterior tibial arterties   Type 2 diabetes mellitus treated with insulin (HCC)    followed by pcp   (06-17-2019  pt stated checks blood sugar daily in am,  fasting sugar-- average 71)   Vitamin B 12 deficiency     Current Outpatient Medications:    ascorbic acid (VITAMIN C) 500 MG tablet, Take 500 mg by mouth daily., Disp: , Rfl:    aspirin EC 81 MG tablet, Take 81 mg by mouth daily., Disp: , Rfl:    atorvastatin (LIPITOR) 80 MG tablet, TAKE 1 TABLET BY MOUTH EVERY DAY, Disp: 90 tablet, Rfl: 3    Cyanocobalamin (VITAMIN B-12) 5000 MCG SUBL, Place 5,000 mcg under the tongue daily., Disp: , Rfl:    fenofibrate (TRICOR) 48 MG tablet, TAKE 1 TABLET BY MOUTH EVERY DAY, Disp: 90 tablet, Rfl: 4   glucose blood (ACCU-CHEK GUIDE) test strip, Dx E11.49 - Use to check blood glucose three times daily or as directed., Disp: 300 each, Rfl: 1   hydrochlorothiazide (MICROZIDE) 12.5 MG capsule, TAKE 1 CAPSULE BY MOUTH EVERY DAY IN THE MORNING (Patient taking differently: Take 12.5 mg by mouth daily. TAKE 1 CAPSULE BY MOUTH EVERY DAY IN THE MORNING), Disp: 100 capsule, Rfl: 3   ibuprofen (ADVIL) 800 MG tablet, Take 1 tablet (800 mg total) by mouth every 6 (six) hours as needed., Disp: 60 tablet, Rfl: 1   insulin aspart (NOVOLOG FLEXPEN) 100 UNIT/ML FlexPen, INJECT 15-25 UNITS INTO THE SKIN 3 TIMES DAILY WITH MEALS. PLEASE PROVIDE PEN NEEDLES IF POSSIBLE, Disp: 15 mL, Rfl: 11   insulin degludec (TRESIBA FLEXTOUCH) 200 UNIT/ML FlexTouch Pen, Inject 100 Units into the skin daily., Disp: 45 mL, Rfl: 3   Insulin Pen Needle 31G X 8 MM MISC, Use to inject insulin daily. Dx: E11.9, Disp: 300 each, Rfl: 11   Insulin Syringe-Needle U-100 (BD INSULIN SYRINGE U/F) 31G X 5/16" 0.5 ML MISC, Use to inject insulin 4  times daily. DX:E11.9, Disp: 100 each, Rfl: 4   Insulin Syringe-Needle U-100 (BD VEO INSULIN SYR U/F 1/2UNIT) 31G X 15/64" 0.3 ML MISC, USE TO INJECT INSULIN FOUR TIMES DAILY AS DIRECTED, Disp: 300 each, Rfl: 2   Lancets (ONETOUCH ULTRASOFT) lancets, Use to test blood sugars daily. Dx: E11.9, Disp: 100 each, Rfl: 12   Lancets MISC, USED TO CHECK BLOOD GLUCOSE 3-4 TIMES DAILY, Disp: 200 each, Rfl: 3   Multiple Vitamin (MULTIVITAMIN WITH MINERALS) TABS tablet, Take 1 tablet by mouth 2 (two) times daily., Disp: , Rfl:    Omega-3 Fatty Acids (FISH OIL) 1360 MG CAPS, Take 1,360 mg by mouth daily. , Disp: , Rfl:    oxyCODONE-acetaminophen (PERCOCET) 5-325 MG tablet, Take 1 tablet by mouth every 4 (four) hours as needed  for severe pain., Disp: 30 tablet, Rfl: 0   pyridoxine (B-6) 100 MG tablet, Take 100 mg by mouth 2 (two) times daily., Disp: , Rfl:    ramipril (ALTACE) 5 MG capsule, TAKE 1 CAPSULE BY MOUTH EVERY DAY IN THE MORNING, Disp: 90 capsule, Rfl: 4   tirzepatide (MOUNJARO) 2.5 MG/0.5ML Pen, INJECT 2.5 MG INTO THE SKIN ONCE A WEEK. FIRST MONTH ONLY THEN 5 MG DOSE, Disp: 9 mL, Rfl: 3   tirzepatide (MOUNJARO) 5 MG/0.5ML Pen, Inject 5 mg into the skin once a week., Disp: 6 mL, Rfl: 1  Social History   Tobacco Use  Smoking Status Never  Smokeless Tobacco Never    No Known Allergies Objective:  There were no vitals filed for this visit. There is no height or weight on file to calculate BMI. Constitutional Well developed. Well nourished.  Vascular Dorsalis pedis pulses palpable bilaterally. Posterior tibial pulses palpable bilaterally. Capillary refill normal to all digits.  No cyanosis or clubbing noted. Pedal hair growth normal.  Neurologic Normal speech. Oriented to person, place, and time. Protective sensation absent  Dermatologic Submet 5 ulceration completely epithelialized.  No signs of dehiscence noted no complication noted.  Skin has skin incision has reepithelialized.  Orthopedic: No pain to palpation either foot.   Radiographs: None Assessment:   No diagnosis found.  Plan:  Patient was evaluated and treated and all questions answered.  Right fifth metatarsal base exostectomy -Status post base exostectomy.  He is doing well clinically skin incisions well coapted.  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  No further wound noted.  Hyperkeratotic skin noted which appears to be decreasing.  No follow-ups on file.

## 2022-11-29 ENCOUNTER — Other Ambulatory Visit: Payer: Self-pay | Admitting: Family Medicine

## 2022-12-07 ENCOUNTER — Other Ambulatory Visit: Payer: Self-pay | Admitting: Family Medicine

## 2022-12-10 ENCOUNTER — Ambulatory Visit (INDEPENDENT_AMBULATORY_CARE_PROVIDER_SITE_OTHER): Payer: BLUE CROSS/BLUE SHIELD | Admitting: Podiatry

## 2022-12-10 DIAGNOSIS — L97412 Non-pressure chronic ulcer of right heel and midfoot with fat layer exposed: Secondary | ICD-10-CM

## 2022-12-10 DIAGNOSIS — E11621 Type 2 diabetes mellitus with foot ulcer: Secondary | ICD-10-CM

## 2022-12-10 NOTE — Progress Notes (Signed)
Subjective:  Patient ID: [name], male    DOB: [birth year],  MRN: [id number]  Chief Complaint  Patient presents with   Routine Post Op    RM#12 POV follow up right foot started draining 12/09/2022     52 y.o. male presents for wound care.  Patient presents with complaint of right lateral hindfoot ulceration with fat layer exposed.  Patient states he started coming out of nowhere he started causing blister.  He wanted to get it evaluated start draining he would like to discuss treatment options pain scale 7 out of 10 dull achy in nature.   Review of Systems: Negative except as noted in the HPI. Denies N/V/F/Ch.  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   (06-17-2019 pt stated has been stable with no changes and followed by pcp)   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HCC)    11-06-2016  lower extremity angiography (dr Edilia Bo)  mil tibial disease w/ occlusion of the distal peroneal artery and dorsalis pedis artery but has widely patent posterior tibial and anterior tibial arterties   Type 2 diabetes mellitus treated with insulin (HCC)    followed by pcp   (06-17-2019  pt stated checks blood sugar daily in am,  fasting sugar-- average 71)   Vitamin B 12 deficiency     Current Outpatient Medications:    ascorbic acid (VITAMIN C) 500 MG tablet, Take 500 mg by mouth daily., Disp: , Rfl:    aspirin EC 81 MG tablet, Take 81 mg by mouth daily.,  Disp: , Rfl:    atorvastatin (LIPITOR) 80 MG tablet, TAKE 1 TABLET BY MOUTH EVERY DAY, Disp: 90 tablet, Rfl: 3   Cyanocobalamin (VITAMIN B-12) 5000 MCG SUBL, Place 5,000 mcg under the tongue daily., Disp: , Rfl:    fenofibrate (TRICOR) 48 MG tablet, TAKE 1 TABLET BY MOUTH EVERY DAY, Disp: 90 tablet, Rfl: 4   glucose blood (ACCU-CHEK GUIDE) test strip, Dx E11.49 - Use to check blood glucose three times daily or as directed., Disp: 300 each, Rfl: 1   hydrochlorothiazide (MICROZIDE) 12.5 MG capsule, TAKE 1 CAPSULE BY MOUTH EVERY DAY IN THE MORNING, Disp: 90 capsule, Rfl: 4   ibuprofen (ADVIL) 800 MG tablet, Take 1 tablet (800 mg total) by mouth every 6 (six) hours as needed., Disp: 60 tablet, Rfl: 1   insulin aspart (NOVOLOG FLEXPEN) 100 UNIT/ML FlexPen, INJECT 15-25 UNITS INTO THE SKIN 3 TIMES DAILY WITH MEALS. PLEASE PROVIDE PEN NEEDLES IF POSSIBLE, Disp: 15 mL, Rfl: 11   insulin degludec (TRESIBA FLEXTOUCH) 200 UNIT/ML FlexTouch Pen, INJECT 80-100 UNITS INTO THE SKIN DAILY., Disp: 9 mL, Rfl: 3   Insulin Pen Needle 31G X 8 MM MISC, Use to inject insulin daily. Dx: E11.9, Disp: 300 each, Rfl: 11   Insulin Syringe-Needle U-100 (BD INSULIN SYRINGE U/F) 31G X 5/16" 0.5 ML MISC, Use to inject insulin  4 times daily. DX:E11.9, Disp: 100 each, Rfl: 4   Insulin Syringe-Needle U-100 (BD VEO INSULIN SYR U/F 1/2UNIT) 31G X 15/64" 0.3 ML MISC, USE TO INJECT INSULIN FOUR TIMES DAILY AS DIRECTED, Disp: 300 each, Rfl: 2   Lancets (ONETOUCH ULTRASOFT) lancets, Use to test blood sugars daily. Dx: E11.9, Disp: 100 each, Rfl: 12   Lancets MISC, USED TO CHECK BLOOD GLUCOSE 3-4 TIMES DAILY, Disp: 200 each, Rfl: 3   Multiple Vitamin (MULTIVITAMIN WITH MINERALS) TABS tablet, Take 1 tablet by mouth 2 (two) times daily., Disp: , Rfl:    Omega-3 Fatty Acids (FISH OIL) 1360 MG CAPS, Take 1,360 mg by mouth daily. , Disp: , Rfl:    oxyCODONE-acetaminophen (PERCOCET) 5-325 MG tablet, Take 1 tablet by mouth every 4 (four) hours as  needed for severe pain., Disp: 30 tablet, Rfl: 0   pyridoxine (B-6) 100 MG tablet, Take 100 mg by mouth 2 (two) times daily., Disp: , Rfl:    ramipril (ALTACE) 5 MG capsule, TAKE 1 CAPSULE BY MOUTH EVERY DAY IN THE MORNING, Disp: 90 capsule, Rfl: 4   tirzepatide (MOUNJARO) 5 MG/0.5ML Pen, Inject 5 mg into the skin once a week., Disp: 6 mL, Rfl: 1   tirzepatide (MOUNJARO) 2.5 MG/0.5ML Pen, INJECT 2.5 MG INTO THE SKIN ONCE A WEEK. FIRST MONTH ONLY THEN 5 MG DOSE, Disp: 9 mL, Rfl: 3  Social History   Tobacco Use  Smoking Status Never  Smokeless Tobacco Never    Not on File Objective:  There were no vitals filed for this visit. There is no height or weight on file to calculate BMI. Constitutional Well developed. Well nourished.  Vascular Dorsalis pedis pulses palpable bilaterally. Posterior tibial pulses palpable bilaterally. Capillary refill normal to all digits.  No cyanosis or clubbing noted. Pedal hair growth normal.  Neurologic Normal speech. Oriented to person, place, and time. Protective sensation absent  Dermatologic Wound Location: Right lateral hindfoot with fat layer exposed.  Does not expose any bone.  No purulent drainage noted no malodor present Wound Base: Mixed Granular/Fibrotic Peri-wound: Calloused Exudate: Scant/small amount Serous exudate Wound Measurements: -See below  Orthopedic: No pain to palpation either foot.   Radiographs: None Assessment:   1. Diabetic ulcer of right midfoot associated with type 2 diabetes mellitus, with fat layer exposed (HCC)    Plan:  Patient was evaluated and treated and all questions answered.  Ulcer right lateral hindfoot ulcer -Debridement as below. -Dressed with Betadine wet-to-dry, DSD. -Continue off-loading with surgical shoe.  Procedure: Excisional Debridement of Wound Tool: Sharp chisel blade/tissue nipper Rationale: Removal of non-viable soft tissue from the wound to promote healing.  Anesthesia:  none Pre-Debridement Wound Measurements: 2 cm x 1.5 cm x 0.3 cm  Post-Debridement Wound Measurements: 2.5 cm x 1.6 cm x 0.3 cm  Type of Debridement: Sharp Excisional Tissue Removed: Non-viable soft tissue Blood loss: Minimal (<50cc) Depth of Debridement: subcutaneous tissue. Technique: Sharp excisional debridement to bleeding, viable wound base.  Wound Progress: This my initial evaluation of continue monitor progression of the wound Site healing conversation 7 Dressing: Dry, sterile, compression dressing. Disposition: Patient tolerated procedure well. Patient to return in 1 week for follow-up.  No follow-ups on file.

## 2023-01-30 ENCOUNTER — Encounter: Payer: Self-pay | Admitting: Family Medicine

## 2023-01-30 ENCOUNTER — Ambulatory Visit: Payer: BLUE CROSS/BLUE SHIELD | Admitting: Family Medicine

## 2023-01-30 VITALS — BP 132/74 | HR 85 | Temp 97.2°F | Ht 72.0 in | Wt 306.8 lb

## 2023-01-30 DIAGNOSIS — E1149 Type 2 diabetes mellitus with other diabetic neurological complication: Secondary | ICD-10-CM | POA: Diagnosis not present

## 2023-01-30 DIAGNOSIS — E785 Hyperlipidemia, unspecified: Secondary | ICD-10-CM

## 2023-01-30 DIAGNOSIS — Z23 Encounter for immunization: Secondary | ICD-10-CM

## 2023-01-30 DIAGNOSIS — I10 Essential (primary) hypertension: Secondary | ICD-10-CM

## 2023-01-30 DIAGNOSIS — N183 Chronic kidney disease, stage 3 unspecified: Secondary | ICD-10-CM

## 2023-01-30 DIAGNOSIS — E1169 Type 2 diabetes mellitus with other specified complication: Secondary | ICD-10-CM | POA: Diagnosis not present

## 2023-01-30 DIAGNOSIS — Z89619 Acquired absence of unspecified leg above knee: Secondary | ICD-10-CM

## 2023-01-30 LAB — COMPREHENSIVE METABOLIC PANEL
ALT: 59 U/L — ABNORMAL HIGH (ref 0–53)
AST: 46 U/L — ABNORMAL HIGH (ref 0–37)
Albumin: 4.4 g/dL (ref 3.5–5.2)
Alkaline Phosphatase: 90 U/L (ref 39–117)
BUN: 22 mg/dL (ref 6–23)
CO2: 27 meq/L (ref 19–32)
Calcium: 10.1 mg/dL (ref 8.4–10.5)
Chloride: 103 meq/L (ref 96–112)
Creatinine, Ser: 1.45 mg/dL (ref 0.40–1.50)
GFR: 55.29 mL/min — ABNORMAL LOW (ref >60.00)
Glucose, Bld: 59 mg/dL — ABNORMAL LOW (ref 70–99)
Potassium: 4.2 meq/L (ref 3.5–5.1)
Sodium: 141 meq/L (ref 135–145)
Total Bilirubin: 1.1 mg/dL (ref 0.2–1.2)
Total Protein: 8.5 g/dL — ABNORMAL HIGH (ref 6.0–8.3)

## 2023-01-30 LAB — HEMOGLOBIN A1C: Hgb A1c MFr Bld: 6.5 % (ref 4.6–6.5)

## 2023-01-30 NOTE — Progress Notes (Signed)
 Phone [phone] In person visit   Subjective:   [name] is a 53 y.o. year old very pleasant male patient who presents for/with See problem oriented charting Chief Complaint  Patient presents with   Medical Management of Chronic Issues   Diabetes   Hyperlipidemia   Past Medical History-  Patient Active Problem List   Diagnosis Date Noted   History of diabetes-related lower limb amputation (HCC) 09/25/2022    Priority: High   Osteomyelitis of ankle or foot, acute, right (HCC) 11/09/2019    Priority: High   Open wound of right foot 05/22/2017    Priority: High   Morbid obesity (HCC) 03/08/2015    Priority: High   Type II diabetes mellitus with neurological manifestations (HCC) 03/07/2015    Priority: High   Pituitary adenoma (HCC) 11/07/2021    Priority: Medium    Diabetic retinopathy (HCC) 01/25/2018    Priority: Medium    Diabetic neuropathy (HCC) 11/17/2016    Priority: Medium    Elevated LFTs     Priority: Medium    Type 2 diabetes mellitus with stage 3 chronic kidney disease, with long-term current use of insulin  (HCC) 03/20/2015    Priority: Medium    Hyperlipidemia associated with type 2 diabetes mellitus (HCC) 03/08/2015    Priority: Medium    Essential hypertension 03/08/2015    Priority: Medium    Charcot's joint of foot, right 11/18/2019    Priority: Low   B12 deficiency 11/17/2016    Priority: Low   Iron deficiency anemia 11/17/2016    Priority: Low   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     aspirin  EC 81 MG tablet Take 81 mg by mouth daily.     atorvastatin  (LIPITOR) 80 MG tablet TAKE 1 TABLET BY MOUTH EVERY DAY  90 tablet 3   Cyanocobalamin  (VITAMIN B-12) 5000 MCG SUBL Place 5,000 mcg under the tongue daily.     fenofibrate  (TRICOR ) 48 MG tablet TAKE 1 TABLET BY MOUTH EVERY DAY 90 tablet 4   hydrochlorothiazide  (MICROZIDE ) 12.5 MG capsule TAKE 1 CAPSULE BY MOUTH EVERY DAY IN THE MORNING 90 capsule 4   ibuprofen  (ADVIL ) 800 MG tablet Take 1 tablet (800 mg total) by mouth every 6 (six) hours as needed. 60 tablet 1   insulin  aspart (NOVOLOG  FLEXPEN) 100 UNIT/ML FlexPen INJECT 15-25 UNITS INTO THE SKIN 3 TIMES DAILY WITH MEALS. PLEASE PROVIDE PEN NEEDLES IF POSSIBLE 15 mL 11   insulin  degludec (TRESIBA  FLEXTOUCH) 200 UNIT/ML FlexTouch Pen INJECT 80-100 UNITS INTO THE SKIN DAILY. 9 mL 3   Insulin  Pen Needle 31G X 8 MM MISC Use to inject insulin  daily. Dx: E11.9 300 each 11   Multiple Vitamin (MULTIVITAMIN WITH MINERALS) TABS tablet Take 1 tablet by mouth 2 (two) times daily.     Omega-3 Fatty Acids (FISH OIL) 1360 MG CAPS Take 1,360 mg by mouth daily.      oxyCODONE -acetaminophen  (PERCOCET) 5-325 MG tablet Take 1 tablet by mouth every 4 (four) hours as needed for severe pain. 30 tablet 0   pyridoxine  (B-6) 100 MG tablet Take 100 mg by mouth 2 (two) times  daily.     ramipril  (ALTACE ) 5 MG capsule TAKE 1 CAPSULE BY MOUTH EVERY DAY IN THE MORNING 90 capsule 4   tirzepatide  (MOUNJARO ) 5 MG/0.5ML Pen Inject 5 mg into the skin once a week. 6 mL 1   No current facility-administered medications for this visit.     Objective:  BP 132/74   Pulse 85   Temp (!) 97.2 F (36.2 C)   Ht 6' (1.829 m)   Wt (!) 306 lb 12.8 oz (139.2 kg)   SpO2 98%   BMI 41.61 kg/m  Gen: NAD, resting comfortably CV: RRR no murmurs rubs or gallops Lungs: CTAB no crackles, wheeze, rhonchi Ext: no edema Skin: warm, dry     Assessment and Plan    #Diabetes-typically with A1c at least under 7.5 S: Medication: Tresiba  at night up to 100 units- 90 lately - no lows since dropping to 90, NovoLog  20 units twice daily down from  25 -Take Januvia  50 mg in the morning but trial of Mounjaro  at 5 mg- weight down 8 lbs even through holidays! Appetite has been down - did occasionally get down into 60's- inlast month got to 68 twice before breakfast- otherwise ranges mostly 90's to 140's. Some higher with holiday meals Lab Results  Component Value Date   HGBA1C 8.8 (H) 09/25/2022   HGBA1C 8.4 (H) 04/21/2022   HGBA1C 7.5 (H) 11/11/2021  A/P: hopefully improved- update a1c today- thaknfully no more lows with his adjustments at home- continue current medications for now . May increase to 7.5 mg Mounjaro  if above goal -morbid obesity noted- down 8 lbs- Mounjaro  also helping with this- continue current medications   # Hyperlipidemia-Ideal LDL under 70-tolerate under 100 S:Medication: Atorvastatin  80 mg daily along with fenofibrate  48 mg - also prefers to take aspirin  for primary prevention  Lab Results  Component Value Date   CHOL 176 04/21/2022   HDL 44.60 04/21/2022   LDLCALC 107 (H) 04/21/2022   LDLDIRECT 142.0 09/25/2022   TRIG 126.0 04/21/2022   CHOLHDL 4 04/21/2022  A/P: mildly elevated- we want to work on lifestyle as already on max dose statin plus fenofibrate - thankfully triglyceride(s) are doing very well at least. 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 Tobie   Recommended follow up: Return in about 4 months (around 05/30/2023) for followup or sooner if needed.Schedule b4 you leave. Future Appointments  Date Time Provider Department Center  02/04/2023  3:45 PM Tobie Franky SQU[facility], DPM [facility]     Lab/Order associations:   ICD-10-CM   1. Type II diabetes mellitus with neurological manifestations (HCC)  E11.49 Hemoglobin A1c    Comprehensive metabolic panel    2. Essential hypertension  I10     3. Hyperlipidemia associated with type 2 diabetes mellitus (HCC)  E11.69    E78.5     4. History of diabetes-related lower limb amputation (HCC) Chronic Z89.619    E11.69     5. Morbid obesity (HCC) Chronic E66.01     6. Stage 3 chronic kidney disease, unspecified whether stage 3a or 3b CKD (HCC) Chronic N18.30  7. Need for pneumococcal 20-valent conjugate vaccination  Z23 Pneumococcal conjugate vaccine 20-valent (Prevnar 20)      No orders of the defined types were placed in this encounter.   Return precautions advised.  Garnette Lukes, MD

## 2023-01-30 NOTE — Patient Instructions (Addendum)
 Please stop by lab before you go If you have mychart- we will send your results within 3 business days of us  receiving them.  If you do not have mychart- we will call you about results within 5 business days of us  receiving them.  *please also note that you will see labs on mychart as soon as they post. I will later go in and write notes on them- will say notes from Dr. Katrinka   No changes today unless labs lead us  to make changes  Recommended follow up: Return in about 4 months (around 05/30/2023) for followup or sooner if needed.Schedule b4 you leave.

## 2023-02-04 ENCOUNTER — Ambulatory Visit: Payer: BLUE CROSS/BLUE SHIELD | Admitting: Podiatry

## 2023-02-04 ENCOUNTER — Encounter: Payer: Self-pay | Admitting: Podiatry

## 2023-02-04 DIAGNOSIS — L97412 Non-pressure chronic ulcer of right heel and midfoot with fat layer exposed: Secondary | ICD-10-CM | POA: Diagnosis not present

## 2023-02-04 DIAGNOSIS — E11621 Type 2 diabetes mellitus with foot ulcer: Secondary | ICD-10-CM

## 2023-02-04 NOTE — Progress Notes (Signed)
Subjective:  Patient ID: [name], male    DOB: [birth year],  MRN: [id number]  Chief Complaint  Patient presents with   Wound Check    RM#13 Follow up right foot drainage present no discomfort at this time.    53 y.o. male presents for wound care.  Patient presents with complaint of right lateral hindfoot ulceration with fat layer exposed.  He states is doing better.  The wound seems to be doing a little bit better denies any other acute complaints   Review of Systems: Negative except as noted in the HPI. Denies N/V/F/Ch.  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   (06-17-2019 pt stated has been stable with no changes and followed by pcp)   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HCC)    11-06-2016  lower extremity angiography (dr Edilia Bo)  mil tibial disease w/ occlusion of the distal peroneal artery and dorsalis pedis artery but has widely patent posterior tibial and anterior tibial arterties   Type 2 diabetes mellitus treated with insulin (HCC)    followed by pcp   (06-17-2019  pt stated checks blood sugar daily in am,  fasting sugar-- average 71)   Vitamin B 12 deficiency     Current Outpatient Medications:    ascorbic acid (VITAMIN C) 500 MG tablet, Take 500 mg by mouth daily., Disp: , Rfl:    aspirin EC 81 MG tablet, Take 81 mg by mouth daily., Disp: , Rfl:    atorvastatin (LIPITOR) 80 MG tablet, TAKE 1 TABLET BY MOUTH EVERY DAY, Disp:  90 tablet, Rfl: 3   Cyanocobalamin (VITAMIN B-12) 5000 MCG SUBL, Place 5,000 mcg under the tongue daily., Disp: , Rfl:    fenofibrate (TRICOR) 48 MG tablet, TAKE 1 TABLET BY MOUTH EVERY DAY, Disp: 90 tablet, Rfl: 4   hydrochlorothiazide (MICROZIDE) 12.5 MG capsule, TAKE 1 CAPSULE BY MOUTH EVERY DAY IN THE MORNING, Disp: 90 capsule, Rfl: 4   ibuprofen (ADVIL) 800 MG tablet, Take 1 tablet (800 mg total) by mouth every 6 (six) hours as needed., Disp: 60 tablet, Rfl: 1   insulin aspart (NOVOLOG FLEXPEN) 100 UNIT/ML FlexPen, INJECT 15-25 UNITS INTO THE SKIN 3 TIMES DAILY WITH MEALS. PLEASE PROVIDE PEN NEEDLES IF POSSIBLE, Disp: 15 mL, Rfl: 11   insulin degludec (TRESIBA FLEXTOUCH) 200 UNIT/ML FlexTouch Pen, INJECT 80-100 UNITS INTO THE SKIN DAILY., Disp: 9 mL, Rfl: 3   Insulin Pen Needle 31G X 8 MM MISC, Use to inject insulin daily. Dx: E11.9, Disp: 300 each, Rfl: 11   Multiple Vitamin (MULTIVITAMIN WITH MINERALS) TABS tablet, Take 1 tablet by mouth 2 (two) times daily., Disp: , Rfl:    Omega-3 Fatty Acids (FISH OIL) 1360 MG CAPS, Take 1,360 mg by mouth daily. , Disp: , Rfl:    oxyCODONE-acetaminophen (PERCOCET) 5-325 MG tablet, Take 1 tablet by mouth every 4 (four) hours as needed for  severe pain., Disp: 30 tablet, Rfl: 0   pyridoxine (B-6) 100 MG tablet, Take 100 mg by mouth 2 (two) times daily., Disp: , Rfl:    ramipril (ALTACE) 5 MG capsule, TAKE 1 CAPSULE BY MOUTH EVERY DAY IN THE MORNING, Disp: 90 capsule, Rfl: 4   tirzepatide (MOUNJARO) 5 MG/0.5ML Pen, Inject 5 mg into the skin once a week., Disp: 6 mL, Rfl: 1  Social History   Tobacco Use  Smoking Status Never  Smokeless Tobacco Never    No Known Allergies Objective:  There were no vitals filed for this visit. There is no height or weight on file to calculate BMI. Constitutional Well developed. Well nourished.  Vascular Dorsalis pedis pulses palpable bilaterally. Posterior tibial pulses palpable bilaterally. Capillary refill normal  to all digits.  No cyanosis or clubbing noted. Pedal hair growth normal.  Neurologic Normal speech. Oriented to person, place, and time. Protective sensation absent  Dermatologic Wound Location: Right lateral hindfoot with fat layer exposed.  Does not expose any bone.  No purulent drainage noted no malodor present Wound Base: Mixed Granular/Fibrotic Peri-wound: Calloused Exudate: Scant/small amount Serous exudate Wound Measurements: -See below  Orthopedic: No pain to palpation either foot.   Radiographs: None Assessment:   No diagnosis found.  Plan:  Patient was evaluated and treated and all questions answered.  Ulcer right lateral hindfoot ulcer -Debridement as below. -Dressed with Betadine wet-to-dry, DSD. -Continue off-loading with surgical shoe.  Procedure: Excisional Debridement of Wound Tool: Sharp chisel blade/tissue nipper Rationale: Removal of non-viable soft tissue from the wound to promote healing.  Anesthesia: none Pre-Debridement Wound Measurements: 1.9 cm x 1.3 cm x 0.3 cm  Post-Debridement Wound Measurements: 2.3 cm x 1.5 cm x 0.3 cm  Type of Debridement: Sharp Excisional Tissue Removed: Non-viable soft tissue Blood loss: Minimal (<50cc) Depth of Debridement: subcutaneous tissue. Technique: Sharp excisional debridement to bleeding, viable wound base.  Wound Progress: 2 days decrease slightly Dressing: Dry, sterile, compression dressing. Disposition: Patient tolerated procedure well. Patient to return in 1 week for follow-up.  No follow-ups on file.

## 2023-02-25 ENCOUNTER — Telehealth: Payer: Self-pay

## 2023-02-25 ENCOUNTER — Ambulatory Visit: Payer: Self-pay | Admitting: Family Medicine

## 2023-02-25 ENCOUNTER — Emergency Department (HOSPITAL_COMMUNITY): Payer: BLUE CROSS/BLUE SHIELD

## 2023-02-25 ENCOUNTER — Inpatient Hospital Stay (HOSPITAL_COMMUNITY): Payer: BLUE CROSS/BLUE SHIELD

## 2023-02-25 ENCOUNTER — Other Ambulatory Visit: Payer: Self-pay

## 2023-02-25 ENCOUNTER — Inpatient Hospital Stay (HOSPITAL_COMMUNITY)
Admission: EM | Admit: 2023-02-25 | Discharge: 2023-03-02 | DRG: 854 | Disposition: A | Discharge status: Home / Self Care | Payer: BLUE CROSS/BLUE SHIELD | Attending: Internal Medicine | Admitting: Internal Medicine

## 2023-02-25 ENCOUNTER — Encounter (HOSPITAL_COMMUNITY): Payer: Self-pay | Admitting: Emergency Medicine

## 2023-02-25 ENCOUNTER — Telehealth: Payer: Self-pay | Admitting: Podiatry

## 2023-02-25 DIAGNOSIS — Z7985 Long-term (current) use of injectable non-insulin antidiabetic drugs: Secondary | ICD-10-CM | POA: Diagnosis not present

## 2023-02-25 DIAGNOSIS — Z7982 Long term (current) use of aspirin: Secondary | ICD-10-CM

## 2023-02-25 DIAGNOSIS — Z833 Family history of diabetes mellitus: Secondary | ICD-10-CM

## 2023-02-25 DIAGNOSIS — L97529 Non-pressure chronic ulcer of other part of left foot with unspecified severity: Secondary | ICD-10-CM | POA: Diagnosis present

## 2023-02-25 DIAGNOSIS — D509 Iron deficiency anemia, unspecified: Secondary | ICD-10-CM | POA: Diagnosis present

## 2023-02-25 DIAGNOSIS — I129 Hypertensive chronic kidney disease with stage 1 through stage 4 chronic kidney disease, or unspecified chronic kidney disease: Secondary | ICD-10-CM | POA: Diagnosis present

## 2023-02-25 DIAGNOSIS — E1142 Type 2 diabetes mellitus with diabetic polyneuropathy: Secondary | ICD-10-CM | POA: Diagnosis present

## 2023-02-25 DIAGNOSIS — Z794 Long term (current) use of insulin: Secondary | ICD-10-CM | POA: Diagnosis not present

## 2023-02-25 DIAGNOSIS — Z89422 Acquired absence of other left toe(s): Secondary | ICD-10-CM | POA: Diagnosis not present

## 2023-02-25 DIAGNOSIS — A4159 Other Gram-negative sepsis: Principal | ICD-10-CM | POA: Diagnosis present

## 2023-02-25 DIAGNOSIS — L089 Local infection of the skin and subcutaneous tissue, unspecified: Secondary | ICD-10-CM | POA: Diagnosis not present

## 2023-02-25 DIAGNOSIS — D352 Benign neoplasm of pituitary gland: Secondary | ICD-10-CM | POA: Diagnosis present

## 2023-02-25 DIAGNOSIS — E782 Mixed hyperlipidemia: Secondary | ICD-10-CM | POA: Diagnosis present

## 2023-02-25 DIAGNOSIS — E1169 Type 2 diabetes mellitus with other specified complication: Secondary | ICD-10-CM | POA: Diagnosis present

## 2023-02-25 DIAGNOSIS — E1152 Type 2 diabetes mellitus with diabetic peripheral angiopathy with gangrene: Secondary | ICD-10-CM | POA: Diagnosis present

## 2023-02-25 DIAGNOSIS — M869 Osteomyelitis, unspecified: Secondary | ICD-10-CM | POA: Diagnosis not present

## 2023-02-25 DIAGNOSIS — E11649 Type 2 diabetes mellitus with hypoglycemia without coma: Secondary | ICD-10-CM | POA: Diagnosis present

## 2023-02-25 DIAGNOSIS — M86172 Other acute osteomyelitis, left ankle and foot: Secondary | ICD-10-CM | POA: Diagnosis present

## 2023-02-25 DIAGNOSIS — E1122 Type 2 diabetes mellitus with diabetic chronic kidney disease: Secondary | ICD-10-CM | POA: Diagnosis present

## 2023-02-25 DIAGNOSIS — Z8249 Family history of ischemic heart disease and other diseases of the circulatory system: Secondary | ICD-10-CM | POA: Diagnosis not present

## 2023-02-25 DIAGNOSIS — E538 Deficiency of other specified B group vitamins: Secondary | ICD-10-CM | POA: Diagnosis present

## 2023-02-25 DIAGNOSIS — A419 Sepsis, unspecified organism: Secondary | ICD-10-CM

## 2023-02-25 DIAGNOSIS — Z79899 Other long term (current) drug therapy: Secondary | ICD-10-CM

## 2023-02-25 DIAGNOSIS — R509 Fever, unspecified: Secondary | ICD-10-CM | POA: Diagnosis present

## 2023-02-25 DIAGNOSIS — N1831 Chronic kidney disease, stage 3a: Secondary | ICD-10-CM | POA: Diagnosis present

## 2023-02-25 DIAGNOSIS — R7401 Elevation of levels of liver transaminase levels: Secondary | ICD-10-CM | POA: Diagnosis present

## 2023-02-25 DIAGNOSIS — N179 Acute kidney failure, unspecified: Secondary | ICD-10-CM | POA: Diagnosis present

## 2023-02-25 DIAGNOSIS — E11621 Type 2 diabetes mellitus with foot ulcer: Secondary | ICD-10-CM | POA: Diagnosis present

## 2023-02-25 DIAGNOSIS — Z6841 Body Mass Index (BMI) 40.0 and over, adult: Secondary | ICD-10-CM

## 2023-02-25 DIAGNOSIS — E11628 Type 2 diabetes mellitus with other skin complications: Secondary | ICD-10-CM

## 2023-02-25 DIAGNOSIS — M009 Pyogenic arthritis, unspecified: Secondary | ICD-10-CM | POA: Diagnosis present

## 2023-02-25 DIAGNOSIS — Z89431 Acquired absence of right foot: Secondary | ICD-10-CM | POA: Diagnosis not present

## 2023-02-25 LAB — CBC WITH DIFFERENTIAL/PLATELET
Abs Immature Granulocytes: 0.06 10*3/uL (ref 0.00–0.07)
Basophils Absolute: 0 10*3/uL (ref 0.0–0.1)
Basophils Relative: 0 %
Eosinophils Absolute: 0.1 10*3/uL (ref 0.0–0.5)
Eosinophils Relative: 0 %
HCT: 40.7 % (ref 39.0–52.0)
Hemoglobin: 12.5 g/dL — ABNORMAL LOW (ref 13.0–17.0)
Immature Granulocytes: 1 %
Lymphocytes Relative: 6 %
Lymphs Abs: 0.8 10*3/uL (ref 0.7–4.0)
MCH: 25.8 pg — ABNORMAL LOW (ref 26.0–34.0)
MCHC: 30.7 g/dL (ref 30.0–36.0)
MCV: 83.9 fL (ref 80.0–100.0)
Monocytes Absolute: 0.5 10*3/uL (ref 0.1–1.0)
Monocytes Relative: 4 %
Neutro Abs: 11.3 10*3/uL — ABNORMAL HIGH (ref 1.7–7.7)
Neutrophils Relative %: 89 %
Platelets: 368 10*3/uL (ref 150–400)
RBC: 4.85 MIL/uL (ref 4.22–5.81)
RDW: 13 % (ref 11.5–15.5)
WBC: 12.7 10*3/uL — ABNORMAL HIGH (ref 4.0–10.5)
nRBC: 0 % (ref 0.0–0.2)

## 2023-02-25 LAB — COMPREHENSIVE METABOLIC PANEL
ALT: 91 U/L — ABNORMAL HIGH (ref 0–44)
AST: 77 U/L — ABNORMAL HIGH (ref 15–41)
Albumin: 3.6 g/dL (ref 3.5–5.0)
Alkaline Phosphatase: 114 U/L (ref 38–126)
Anion gap: 13 (ref 5–15)
BUN: 23 mg/dL — ABNORMAL HIGH (ref 6–20)
CO2: 22 mmol/L (ref 22–32)
Calcium: 9.3 mg/dL (ref 8.9–10.3)
Chloride: 103 mmol/L (ref 98–111)
Creatinine, Ser: 1.78 mg/dL — ABNORMAL HIGH (ref 0.61–1.24)
GFR, Estimated: 45 mL/min — ABNORMAL LOW (ref >60)
Glucose, Bld: 124 mg/dL — ABNORMAL HIGH (ref 70–99)
Potassium: 3.6 mmol/L (ref 3.5–5.1)
Sodium: 138 mmol/L (ref 135–145)
Total Bilirubin: 1.4 mg/dL — ABNORMAL HIGH (ref 0.0–1.2)
Total Protein: 8.6 g/dL — ABNORMAL HIGH (ref 6.5–8.1)

## 2023-02-25 LAB — PROTIME-INR
INR: 1 (ref 0.8–1.2)
Prothrombin Time: 13.1 s (ref 11.4–15.2)

## 2023-02-25 LAB — I-STAT CG4 LACTIC ACID, ED
Lactic Acid, Venous: 1.6 mmol/L (ref 0.5–1.9)
Lactic Acid, Venous: 0.8 mmol/L (ref 0.5–1.9)

## 2023-02-25 LAB — CBC
HCT: 41.1 % (ref 39.0–52.0)
Hemoglobin: 12.6 g/dL — ABNORMAL LOW (ref 13.0–17.0)
MCH: 26.4 pg (ref 26.0–34.0)
MCHC: 30.7 g/dL (ref 30.0–36.0)
MCV: 86 fL (ref 80.0–100.0)
Platelets: 306 10*3/uL (ref 150–400)
RBC: 4.78 MIL/uL (ref 4.22–5.81)
RDW: 13 % (ref 11.5–15.5)
WBC: 13.2 10*3/uL — ABNORMAL HIGH (ref 4.0–10.5)
nRBC: 0.3 % — ABNORMAL HIGH (ref 0.0–0.2)

## 2023-02-25 LAB — APTT: aPTT: 30 s (ref 24–36)

## 2023-02-25 LAB — GLUCOSE, CAPILLARY
Glucose-Capillary: 75 mg/dL (ref 70–99)
Glucose-Capillary: 93 mg/dL (ref 70–99)

## 2023-02-25 MED ORDER — ENOXAPARIN SODIUM 60 MG/0.6ML IJ SOSY
60.0000 mg | PREFILLED_SYRINGE | INTRAMUSCULAR | Status: DC
  Administered 2023-02-25 – 2023-02-26 (×2): 60 mg via SUBCUTANEOUS
  Filled 2023-02-25 (×2): qty 0.6

## 2023-02-25 MED ORDER — ACETAMINOPHEN 650 MG RE SUPP: 650.0000 mg | Freq: Four times a day (QID) | RECTAL | Status: DC | PRN

## 2023-02-25 MED ORDER — LACTATED RINGERS IV BOLUS (SEPSIS): 500.0000 mL | Freq: Once | INTRAVENOUS | Status: DC

## 2023-02-25 MED ORDER — ASPIRIN 81 MG PO TBEC
81.0000 mg | DELAYED_RELEASE_TABLET | Freq: Every day | ORAL | Status: AC
Start: 2023-02-26 — End: ?
  Administered 2023-02-26 – 2023-03-02 (×5): 81 mg via ORAL
  Filled 2023-02-25 (×5): qty 1

## 2023-02-25 MED ORDER — INSULIN ASPART 100 UNIT/ML IJ SOLN
0.0000 [IU] | Freq: Every day | INTRAMUSCULAR | Status: DC
  Administered 2023-03-01: 2 [IU] via SUBCUTANEOUS

## 2023-02-25 MED ORDER — LINEZOLID 600 MG/300ML IV SOLN
600.0000 mg | Freq: Two times a day (BID) | INTRAVENOUS | Status: DC
Start: 2023-02-26 — End: 2023-03-02
  Administered 2023-02-26 – 2023-03-01 (×8): 600 mg via INTRAVENOUS
  Filled 2023-02-25 (×9): qty 300

## 2023-02-25 MED ORDER — SODIUM CHLORIDE 0.9 % IV SOLN
2.0000 g | Freq: Once | INTRAVENOUS | Status: AC
  Administered 2023-02-25: 2 g via INTRAVENOUS
  Filled 2023-02-25: qty 20

## 2023-02-25 MED ORDER — SODIUM CHLORIDE 0.9% FLUSH
3.0000 mL | Freq: Two times a day (BID) | INTRAVENOUS | Status: DC
  Administered 2023-02-25 – 2023-03-01 (×4): 3 mL via INTRAVENOUS

## 2023-02-25 MED ORDER — METRONIDAZOLE 500 MG PO TABS
500.0000 mg | ORAL_TABLET | Freq: Once | ORAL | Status: AC
  Administered 2023-02-25: 500 mg via ORAL
  Filled 2023-02-25: qty 1

## 2023-02-25 MED ORDER — ONDANSETRON HCL 4 MG/2ML IJ SOLN: 4.0000 mg | Freq: Four times a day (QID) | INTRAMUSCULAR | Status: DC | PRN

## 2023-02-25 MED ORDER — VANCOMYCIN HCL 2000 MG/400ML IV SOLN
2000.0000 mg | Freq: Once | INTRAVENOUS | Status: AC
  Administered 2023-02-25: 2000 mg via INTRAVENOUS
  Filled 2023-02-25: qty 400

## 2023-02-25 MED ORDER — FENOFIBRATE 54 MG PO TABS
54.0000 mg | ORAL_TABLET | Freq: Every day | ORAL | Status: DC
  Administered 2023-02-25 – 2023-03-02 (×5): 54 mg via ORAL
  Filled 2023-02-25 (×6): qty 1

## 2023-02-25 MED ORDER — ONDANSETRON HCL 4 MG PO TABS: 4.0000 mg | ORAL_TABLET | Freq: Four times a day (QID) | ORAL | Status: DC | PRN

## 2023-02-25 MED ORDER — INSULIN ASPART 100 UNIT/ML IJ SOLN
0.0000 [IU] | Freq: Three times a day (TID) | INTRAMUSCULAR | Status: AC
Start: 2023-02-25 — End: ?
  Administered 2023-02-28: 2 [IU] via SUBCUTANEOUS
  Administered 2023-02-28 – 2023-03-01 (×2): 3 [IU] via SUBCUTANEOUS
  Administered 2023-03-01: 2 [IU] via SUBCUTANEOUS

## 2023-02-25 MED ORDER — LACTATED RINGERS IV SOLN: INTRAVENOUS | Status: DC

## 2023-02-25 MED ORDER — ACETAMINOPHEN 325 MG PO TABS: 650.0000 mg | ORAL_TABLET | Freq: Four times a day (QID) | ORAL | Status: DC | PRN

## 2023-02-25 MED ORDER — SODIUM CHLORIDE 0.9 % IV SOLN
1000.0000 mg | Freq: Two times a day (BID) | INTRAVENOUS | Status: DC
  Administered 2023-02-26: 1000 mg via INTRAVENOUS
  Filled 2023-02-25 (×2): qty 20

## 2023-02-25 MED ORDER — LACTATED RINGERS IV BOLUS (SEPSIS): 1000.0000 mL | Freq: Once | INTRAVENOUS | Status: DC

## 2023-02-25 MED ORDER — LINEZOLID 600 MG/300ML IV SOLN
600.0000 mg | Freq: Two times a day (BID) | INTRAVENOUS | Status: DC
  Filled 2023-02-25: qty 300

## 2023-02-25 MED ORDER — ATORVASTATIN CALCIUM 20 MG PO TABS: 80.0000 mg | ORAL_TABLET | Freq: Every day | ORAL | Status: DC

## 2023-02-25 MED ORDER — ACETAMINOPHEN 325 MG PO TABS: 650.0000 mg | ORAL_TABLET | Freq: Once | ORAL | Status: DC | PRN

## 2023-02-25 MED ORDER — OMEGA-3-ACID ETHYL ESTERS 1 G PO CAPS
1.0000 g | ORAL_CAPSULE | Freq: Every day | ORAL | Status: DC
  Administered 2023-02-25 – 2023-03-02 (×5): 1 g via ORAL
  Filled 2023-02-25 (×6): qty 1

## 2023-02-25 MED ORDER — HYDRALAZINE HCL 20 MG/ML IJ SOLN: 10.0000 mg | Freq: Four times a day (QID) | INTRAMUSCULAR | Status: DC | PRN

## 2023-02-25 MED ORDER — LACTATED RINGERS IV BOLUS (SEPSIS)
1000.0000 mL | Freq: Once | INTRAVENOUS | Status: AC
  Administered 2023-02-25: 1000 mL via INTRAVENOUS

## 2023-02-25 MED ORDER — LACTATED RINGERS IV SOLN: INTRAVENOUS | Status: AC

## 2023-02-25 MED ORDER — VANCOMYCIN HCL IN DEXTROSE 1-5 GM/200ML-% IV SOLN: 1000.0000 mg | Freq: Once | INTRAVENOUS | Status: DC

## 2023-02-25 MED ORDER — TIRZEPATIDE 5 MG/0.5ML ~~LOC~~ SOAJ: 5.0000 mg | SUBCUTANEOUS | Status: DC

## 2023-02-25 NOTE — Consult Note (Signed)
 Subjective:  Patient ID: [name], male    DOB: 03-29-1970,  MRN: [id number]  Patient with past medical history of DM2, HTN, obesity, CKDII, pituitary adenoma and previous partial ray amputation of right foot  seen at beside today for left great toe wound. Relates he notices a few weeks ago a blister on the toe that he had been caring for. Then relates it started to have an odor and today developed a fever and chills. He was advised to come to the ED and was admitted. X-rays concern for osteomyelitis. He regularly follows with Dr. Tobie for right foot wound. .States currently feeling better and pain improved.   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   (06-17-2019 pt stated has been stable with no changes and followed by pcp)   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HCC)    11-06-2016  lower extremity angiography (dr eliza)  mil tibial disease w/ occlusion of the distal peroneal artery and dorsalis pedis artery but has widely patent posterior tibial and anterior tibial arterties   Type 2 diabetes mellitus treated with insulin  (HCC)    followed by pcp   (06-17-2019  pt stated checks blood sugar daily in am,  fasting sugar-- average 71)   Vitamin B 12 deficiency      Past Surgical History:  Procedure Laterality Date   AMPUTATION Right 07/31/2016   Procedure: AMPUTATION RIGHT FIFTH RAY AND APPLICATION OF WOUND VAC;  Surgeon: Yvone Rush, MD;   Location: [facility];  Service: Orthopedics;  Laterality: Right;   AMPUTATION Right 08/08/2016   Procedure: RAY AMPUTATION RIGHT FOURTH METATARSAL, DEBRIDEMENT OF SUBQ TISSUE,BONE AND SKIN WITH WOUND CLOSURE;  Surgeon: Yvone Rush, MD;  Location: [facility];  Service: Orthopedics;  Laterality: Right;   AMPUTATION TOE Left 06/21/2019   Procedure: AMPUTATION TOE SECOND LEFT, DEBRIDEMENT OF LEFT GREAT TOE;  Surgeon: Gretel Ozell PARAS, DPM;  Location: [facility];  Service: Podiatry;  Laterality: Left;   AMPUTATION TOE Right 09/03/2020   Procedure: AMPUTATION TOE;  Surgeon: Gretel Ozell PARAS, DPM;  Location: [facility];  Service: Podiatry;  Laterality: Right;   ANAL FISSURE REPAIR  2000   APPLICATION OF A-CELL OF EXTREMITY Right 08/13/2016   Procedure: APPLICATION OF A-CELL AND VAC;  Surgeon: Lowery Estefana RAMAN, DO;  Location: [facility];  Service: Plastics;  Laterality: Right;   APPLICATION OF A-CELL OF EXTREMITY Right 07/16/2017   Procedure: CELLERATE PLACEMENT;  Surgeon: Lowery Estefana RAMAN, DO;  Location:  [facility];  Service: Plastics;  Laterality: Right;   APPLICATION OF WOUND VAC Right 09/18/2016   Procedure: APPLICATION OF WOUND VAC (PT. WILL BRING HIS OWN);  Surgeon: Lowery Estefana RAMAN, DO;  Location: [facility];  Service: Plastics;  Laterality: Right;   APPLICATION OF WOUND VAC Right 10/30/2016   Procedure: APPLICATION OF WOUND VAC;  Surgeon: Lowery Estefana RAMAN, DO;  Location: [facility];  Service: Plastics;  Laterality: Right;   APPLICATION OF WOUND VAC Right 12/01/2016   Procedure: APPLICATION OF WOUND VAC;  Surgeon: Lowery Estefana RAMAN, DO;  Location: [facility];  Service: Plastics;  Laterality: Right;   BONE BIOPSY Left 01/04/2018   Procedure: LEFT FOOT BONE BIOPSY;  Surgeon: Gretel Ozell PARAS, DPM;  Location: [facility];  Service: Podiatry;  Laterality: Left;   BONE BIOPSY Right 09/03/2020   Procedure: BONE BIOPSY;  Surgeon: Gretel Ozell PARAS,  DPM;  Location: [facility];  Service: Podiatry;  Laterality: Right;   CIRCUMCISION  2006   COLONOSCOPY  04/08/2021   DEBRIDEMENT  FOOT Right 07/16/2017   ESOPHAGOGASTRODUODENOSCOPY (EGD) WITH PROPOFOL  N/A 11/07/2021   Procedure: ESOPHAGOGASTRODUODENOSCOPY (EGD) WITH PROPOFOL ;  Surgeon: San Sandor GAILS, DO;  Location: [facility];  Service: Gastroenterology;  Laterality: N/A;   GIVENS CAPSULE STUDY N/A 11/07/2021   Procedure: GIVENS CAPSULE STUDY;  Surgeon: [place] Sandor GAILS, DO;  Location: [facility];  Service: Gastroenterology;  Laterality: N/A;   I & D EXTREMITY Right 09/18/2016   Procedure: IRRIGATION AND DEBRIDEMENT OF RIGHT LATERAL DIABLTIC FOOT ULCER;  Surgeon: Lowery Estefana RAMAN, DO;  Location: [facility];  Service: Plastics;  Laterality: Right;   I & D EXTREMITY Right 03/11/2017   Procedure: IRRIGATION AND DEBRIDEMENT OF RIGHT LATERAL FOOT WOUND WITH CELLERATE COLLAGEN;  Surgeon: Lowery Estefana RAMAN, DO;  Location: [facility];  Service: Plastics;  Laterality: Right;   INCISION AND DRAINAGE OF WOUND Right 08/13/2016   Procedure: IRRIGATION AND DEBRIDEMENT WOUND RIGHT FOOT;  Surgeon: Lowery Estefana RAMAN, DO;  Location: [facility];  Service: Plastics;  Laterality: Right;   IR PATIENT EVAL TECH 0-60 MINS  01/12/2020   LAPAROSCOPIC CHOLECYSTECTOMY  09-09-2006   dr lily   LOWER EXTREMITY ANGIOGRAPHY N/A 10/27/2016   Procedure: Lower Extremity Angiography;  Surgeon: Eliza Lonni RAMAN, MD;  [facility];  Service: Cardiovascular;  Laterality: N/A;   MASS EXCISION Right 10/30/2016   Procedure: EXCISION RIGHT FOOT WOUND WITH VAC PLACEMENT;  Surgeon: Lowery Estefana RAMAN, DO;  Location: [facility];  Service: Plastics;  Laterality: Right;   MASS EXCISION Right 07/16/2017   Procedure: EXCISION OF RIGHT FOOT WOUND WITH CELLERATE PLACEMENT;  Surgeon: Lowery Estefana RAMAN, DO;  Location: [facility];  Service:  Plastics;  Laterality: Right;   METATARSAL HEAD EXCISION Left 01/04/2018   Procedure: 5TH METATARSAL RESECTION,  WOUND CLOSURE,  Adjacent Tissue Transfer.;  Surgeon: Gretel Ozell PARAS, DPM;  Location: [facility];  Service: Podiatry;  Laterality: Left;   SKIN SPLIT GRAFT Right 12/01/2016   Procedure: SKIN GRAFT SPLIT THICKNESS TO RIGHT LATERAL FOOT WOUND 9 X 3 CM;  Surgeon: Lowery Estefana RAMAN, DO;  Location: [facility];  Service: Plastics;  Laterality: Right;   wisdom teeth extracted  1984   WOUND DEBRIDEMENT Right 09/03/2020   Procedure: DEBRIDEMENT WOUND, INSERTION OF ANTIBIOTIC BEADS;  Surgeon: Gretel Ozell PARAS, DPM;  Location: [facility];  Service: Podiatry;  Laterality: Right;       Latest Ref Rng & Units 02/25/2023   11:59 AM 09/25/2022    8:39 AM 04/21/2022    2:57 PM  CBC  WBC 4.0 - 10.5 K/uL 12.7  7.8  11.0   Hemoglobin 13.0 - 17.0 g/dL 87.4  86.2  86.3   Hematocrit 39.0 - 52.0 % 40.7  42.5  42.1   Platelets 150 - 400 K/uL 368  302.0  371.0  Latest Ref Rng & Units 02/25/2023   11:59 AM 01/30/2023    9:18 AM 09/25/2022    8:39 AM  BMP  Glucose 70 - 99 mg/dL 875  59  854   BUN 6 - 20 mg/dL 23  22  21    Creatinine 0.61 - 1.24 mg/dL 8.21  8.54  8.67   Sodium 135 - 145 mmol/L 138  141  137   Potassium 3.5 - 5.1 mmol/L 3.6  4.2  4.0   Chloride 98 - 111 mmol/L 103  103  100   CO2 22 - 32 mmol/L 22  27  28    Calcium  8.9 - 10.3 mg/dL 9.3  89.8  89.7      Objective:   Vitals:   02/25/23 1250 02/25/23 1535  BP: 100/65 120/66  Pulse: (!) 108 96  Resp: 14 16  Temp:  98.2 F (36.8 C)  SpO2: 98% 100%    General:AA&O x 3. Normal mood and affect   Vascular: DP and PT pulses 2/4 bilateral. Brisk capillary refill to all digits. Pedal hair present   Neruological. Epicritic sensation grossly intact.   Derm: Left hallux wound with necrotic base measuring 0,5 cm x1 cm x0.5 cm.Erythema and edema noted with probe to bone and some scant purulence. . Interspaces clears of maceration. Nails well  groomed and normal in appearance  MSK: MMT 5/5 in dorsiflexion, plantar flexion, inversion and eversion. Normal joint ROM without pain or crepitus.    Radiographs:   IMPRESSION: 1. Moderate great toe soft tissue swelling with irregularity of the distal great toe soft tissues suspicious for an ulcer and sinus tract. 2. New mild cortical erosion at the distal aspect of the great toe distal phalanx with a curvilinear lucency that may represent bone fragmentation. This is suspicious for acute osteomyelitis. 3. Redemonstration of amputation of the second toe middle and distal phalanges and the distal aspect of the fifth metatarsal.  Assessment & Plan:  Patient was evaluated and treated and all questions answered.  DX: Left hallux osteomyelitis  Wound care: betadine , DSD  Antibiotics: Per primary Continue broad IV antibiotics  DME: post op shoe.   Discussed with patient diagnosis and treatment options.  Imaging reviewed. Radiographs concerning for osteomyelitis and given clinical picture concern for deep infection. Discussed with patient given findings his best chance at healing this is with partial amputation of the toe.  Discussed with patient and wife would like to get MRI to see extent of spread, discussed though that conservative treatments and HBO therapy would not be helpful to heal the toe at this point and best option is amputation. Patient and family in agreement. Will plan for OR for partial amputation of the right hallux on Friday.  Recommend MRI for further evaluation.  NPO after midnight on Thursday.   Patient in agreement with plan and all questions answered.   [name], DPM  Accessible via secure chat for questions or concerns.

## 2023-02-25 NOTE — Progress Notes (Signed)
[facility] is following this code sepsis ?

## 2023-02-25 NOTE — Telephone Encounter (Signed)
Already addressed in separate encounter from today.

## 2023-02-25 NOTE — H&P (View-Only) (Signed)
 Subjective:  Patient ID: [name], male    DOB: 03-29-1970,  MRN: [id number]  Patient with past medical history of DM2, HTN, obesity, CKDII, pituitary adenoma and previous partial ray amputation of right foot  seen at beside today for left great toe wound. Relates he notices a few weeks ago a blister on the toe that he had been caring for. Then relates it started to have an odor and today developed a fever and chills. He was advised to come to the ED and was admitted. X-rays concern for osteomyelitis. He regularly follows with Dr. Tobie for right foot wound. .States currently feeling better and pain improved.   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   (06-17-2019 pt stated has been stable with no changes and followed by pcp)   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HCC)    11-06-2016  lower extremity angiography (dr eliza)  mil tibial disease w/ occlusion of the distal peroneal artery and dorsalis pedis artery but has widely patent posterior tibial and anterior tibial arterties   Type 2 diabetes mellitus treated with insulin  (HCC)    followed by pcp   (06-17-2019  pt stated checks blood sugar daily in am,  fasting sugar-- average 71)   Vitamin B 12 deficiency      Past Surgical History:  Procedure Laterality Date   AMPUTATION Right 07/31/2016   Procedure: AMPUTATION RIGHT FIFTH RAY AND APPLICATION OF WOUND VAC;  Surgeon: Yvone Rush, MD;   Location: [facility];  Service: Orthopedics;  Laterality: Right;   AMPUTATION Right 08/08/2016   Procedure: RAY AMPUTATION RIGHT FOURTH METATARSAL, DEBRIDEMENT OF SUBQ TISSUE,BONE AND SKIN WITH WOUND CLOSURE;  Surgeon: Yvone Rush, MD;  Location: [facility];  Service: Orthopedics;  Laterality: Right;   AMPUTATION TOE Left 06/21/2019   Procedure: AMPUTATION TOE SECOND LEFT, DEBRIDEMENT OF LEFT GREAT TOE;  Surgeon: Gretel Ozell PARAS, DPM;  Location: [facility];  Service: Podiatry;  Laterality: Left;   AMPUTATION TOE Right 09/03/2020   Procedure: AMPUTATION TOE;  Surgeon: Gretel Ozell PARAS, DPM;  Location: [facility];  Service: Podiatry;  Laterality: Right;   ANAL FISSURE REPAIR  2000   APPLICATION OF A-CELL OF EXTREMITY Right 08/13/2016   Procedure: APPLICATION OF A-CELL AND VAC;  Surgeon: Lowery Estefana RAMAN, DO;  Location: [facility];  Service: Plastics;  Laterality: Right;   APPLICATION OF A-CELL OF EXTREMITY Right 07/16/2017   Procedure: CELLERATE PLACEMENT;  Surgeon: Lowery Estefana RAMAN, DO;  Location:  [facility];  Service: Plastics;  Laterality: Right;   APPLICATION OF WOUND VAC Right 09/18/2016   Procedure: APPLICATION OF WOUND VAC (PT. WILL BRING HIS OWN);  Surgeon: Lowery Estefana RAMAN, DO;  Location: [facility];  Service: Plastics;  Laterality: Right;   APPLICATION OF WOUND VAC Right 10/30/2016   Procedure: APPLICATION OF WOUND VAC;  Surgeon: Lowery Estefana RAMAN, DO;  Location: [facility];  Service: Plastics;  Laterality: Right;   APPLICATION OF WOUND VAC Right 12/01/2016   Procedure: APPLICATION OF WOUND VAC;  Surgeon: Lowery Estefana RAMAN, DO;  Location: [facility];  Service: Plastics;  Laterality: Right;   BONE BIOPSY Left 01/04/2018   Procedure: LEFT FOOT BONE BIOPSY;  Surgeon: Gretel Ozell PARAS, DPM;  Location: [facility];  Service: Podiatry;  Laterality: Left;   BONE BIOPSY Right 09/03/2020   Procedure: BONE BIOPSY;  Surgeon: Gretel Ozell PARAS,  DPM;  Location: [facility];  Service: Podiatry;  Laterality: Right;   CIRCUMCISION  2006   COLONOSCOPY  04/08/2021   DEBRIDEMENT  FOOT Right 07/16/2017   ESOPHAGOGASTRODUODENOSCOPY (EGD) WITH PROPOFOL  N/A 11/07/2021   Procedure: ESOPHAGOGASTRODUODENOSCOPY (EGD) WITH PROPOFOL ;  Surgeon: San Sandor GAILS, DO;  Location: [facility];  Service: Gastroenterology;  Laterality: N/A;   GIVENS CAPSULE STUDY N/A 11/07/2021   Procedure: GIVENS CAPSULE STUDY;  Surgeon: [place] Sandor GAILS, DO;  Location: [facility];  Service: Gastroenterology;  Laterality: N/A;   I & D EXTREMITY Right 09/18/2016   Procedure: IRRIGATION AND DEBRIDEMENT OF RIGHT LATERAL DIABLTIC FOOT ULCER;  Surgeon: Lowery Estefana RAMAN, DO;  Location: [facility];  Service: Plastics;  Laterality: Right;   I & D EXTREMITY Right 03/11/2017   Procedure: IRRIGATION AND DEBRIDEMENT OF RIGHT LATERAL FOOT WOUND WITH CELLERATE COLLAGEN;  Surgeon: Lowery Estefana RAMAN, DO;  Location: [facility];  Service: Plastics;  Laterality: Right;   INCISION AND DRAINAGE OF WOUND Right 08/13/2016   Procedure: IRRIGATION AND DEBRIDEMENT WOUND RIGHT FOOT;  Surgeon: Lowery Estefana RAMAN, DO;  Location: [facility];  Service: Plastics;  Laterality: Right;   IR PATIENT EVAL TECH 0-60 MINS  01/12/2020   LAPAROSCOPIC CHOLECYSTECTOMY  09-09-2006   dr lily   LOWER EXTREMITY ANGIOGRAPHY N/A 10/27/2016   Procedure: Lower Extremity Angiography;  Surgeon: Eliza Lonni RAMAN, MD;  [facility];  Service: Cardiovascular;  Laterality: N/A;   MASS EXCISION Right 10/30/2016   Procedure: EXCISION RIGHT FOOT WOUND WITH VAC PLACEMENT;  Surgeon: Lowery Estefana RAMAN, DO;  Location: [facility];  Service: Plastics;  Laterality: Right;   MASS EXCISION Right 07/16/2017   Procedure: EXCISION OF RIGHT FOOT WOUND WITH CELLERATE PLACEMENT;  Surgeon: Lowery Estefana RAMAN, DO;  Location: [facility];  Service:  Plastics;  Laterality: Right;   METATARSAL HEAD EXCISION Left 01/04/2018   Procedure: 5TH METATARSAL RESECTION,  WOUND CLOSURE,  Adjacent Tissue Transfer.;  Surgeon: Gretel Ozell PARAS, DPM;  Location: [facility];  Service: Podiatry;  Laterality: Left;   SKIN SPLIT GRAFT Right 12/01/2016   Procedure: SKIN GRAFT SPLIT THICKNESS TO RIGHT LATERAL FOOT WOUND 9 X 3 CM;  Surgeon: Lowery Estefana RAMAN, DO;  Location: [facility];  Service: Plastics;  Laterality: Right;   wisdom teeth extracted  1984   WOUND DEBRIDEMENT Right 09/03/2020   Procedure: DEBRIDEMENT WOUND, INSERTION OF ANTIBIOTIC BEADS;  Surgeon: Gretel Ozell PARAS, DPM;  Location: [facility];  Service: Podiatry;  Laterality: Right;       Latest Ref Rng & Units 02/25/2023   11:59 AM 09/25/2022    8:39 AM 04/21/2022    2:57 PM  CBC  WBC 4.0 - 10.5 K/uL 12.7  7.8  11.0   Hemoglobin 13.0 - 17.0 g/dL 87.4  86.2  86.3   Hematocrit 39.0 - 52.0 % 40.7  42.5  42.1   Platelets 150 - 400 K/uL 368  302.0  371.0  Latest Ref Rng & Units 02/25/2023   11:59 AM 01/30/2023    9:18 AM 09/25/2022    8:39 AM  BMP  Glucose 70 - 99 mg/dL 875  59  854   BUN 6 - 20 mg/dL 23  22  21    Creatinine 0.61 - 1.24 mg/dL 8.21  8.54  8.67   Sodium 135 - 145 mmol/L 138  141  137   Potassium 3.5 - 5.1 mmol/L 3.6  4.2  4.0   Chloride 98 - 111 mmol/L 103  103  100   CO2 22 - 32 mmol/L 22  27  28    Calcium  8.9 - 10.3 mg/dL 9.3  89.8  89.7      Objective:   Vitals:   02/25/23 1250 02/25/23 1535  BP: 100/65 120/66  Pulse: (!) 108 96  Resp: 14 16  Temp:  98.2 F (36.8 C)  SpO2: 98% 100%    General:AA&O x 3. Normal mood and affect   Vascular: DP and PT pulses 2/4 bilateral. Brisk capillary refill to all digits. Pedal hair present   Neruological. Epicritic sensation grossly intact.   Derm: Left hallux wound with necrotic base measuring 0,5 cm x1 cm x0.5 cm.Erythema and edema noted with probe to bone and some scant purulence. . Interspaces clears of maceration. Nails well  groomed and normal in appearance  MSK: MMT 5/5 in dorsiflexion, plantar flexion, inversion and eversion. Normal joint ROM without pain or crepitus.    Radiographs:   IMPRESSION: 1. Moderate great toe soft tissue swelling with irregularity of the distal great toe soft tissues suspicious for an ulcer and sinus tract. 2. New mild cortical erosion at the distal aspect of the great toe distal phalanx with a curvilinear lucency that may represent bone fragmentation. This is suspicious for acute osteomyelitis. 3. Redemonstration of amputation of the second toe middle and distal phalanges and the distal aspect of the fifth metatarsal.  Assessment & Plan:  Patient was evaluated and treated and all questions answered.  DX: Left hallux osteomyelitis  Wound care: betadine , DSD  Antibiotics: Per primary Continue broad IV antibiotics  DME: post op shoe.   Discussed with patient diagnosis and treatment options.  Imaging reviewed. Radiographs concerning for osteomyelitis and given clinical picture concern for deep infection. Discussed with patient given findings his best chance at healing this is with partial amputation of the toe.  Discussed with patient and wife would like to get MRI to see extent of spread, discussed though that conservative treatments and HBO therapy would not be helpful to heal the toe at this point and best option is amputation. Patient and family in agreement. Will plan for OR for partial amputation of the right hallux on Friday.  Recommend MRI for further evaluation.  NPO after midnight on Thursday.   Patient in agreement with plan and all questions answered.   [name], DPM  Accessible via secure chat for questions or concerns.

## 2023-02-25 NOTE — Telephone Encounter (Signed)
 Pts wife called and pts work is sending him home due to him having a 101 fever. He is driving home now. She said she is not sure if its from his left great toe infection. She said they are not aware of pt being exposed to any other viruses going around. Pt told wife he is not able to get warm in his car. Please advise does he need to go to the hospital or need an appt?

## 2023-02-25 NOTE — Telephone Encounter (Signed)
 Notified pts wife pt needs to go to the hospital. She said ok and thank you!

## 2023-02-25 NOTE — ED Notes (Signed)
 Patient requests to talk to the PA before taking the Tylenol .

## 2023-02-25 NOTE — Telephone Encounter (Signed)
 Please schedule ov with available provider to be evaluated.  Copied from CRM [id number]. Topic: Clinical - Medical Advice >> Feb 25, 2023  9:59 AM Evie B wrote: Reason for CRM: Patient's wife earnie calling regarding medical advise states pt is a diabetic with Fever of 101 and chills, shivering, Pt was around someone with the Flu, pt also has a bliss. Ulcer on his toe. Requesting medical advice. Please call back at [phone]

## 2023-02-25 NOTE — ED Provider Triage Note (Signed)
 Emergency Medicine Provider Triage Evaluation Note  [name] , a 53 y.o. male  was evaluated in triage.  Pt complains of sepsis. Progressing worsening swelling and pain involving L great toe x 3 weeks.  Developed fever, and chills today.  No n/v/d, no runny nose, sneeze, or cough, no cp or sob, no abd pain  Review of Systems  Positive: As above Negative: As above  Physical Exam  BP 105/60 (BP Location: Left Arm)   Pulse (!) 122   Temp (!) 101.5 F (38.6 C) (Oral)   Resp 18   SpO2 99%  Gen:   Awake, no distress   Resp:  Normal effort  MSK:   Moves extremities without difficulty  Other:    Medical Decision Making  Medically screening exam initiated at 11:54 AM.  Appropriate orders placed.  [name] was informed that the remainder of the evaluation will be completed by another provider, this initial triage assessment does not replace that evaluation, and the importance of remaining in the ED until their evaluation is complete.  Infected diabetic foot ulcer involving L great toe. Septic.  Move back to next available room.  Code sepsis initiated.    Nivia Colon, PA-C 02/25/23 1156

## 2023-02-25 NOTE — H&P (Signed)
 History and Physical    Patient: [name] FMW:[id number] DOB: 1970/04/05 DOA: 02/25/2023 DOS: the patient was seen and examined on 02/25/2023 PCP: Katrinka Garnette KIDD, MD  Patient coming from: Home with wife Medical readiness/disposition: Anticipate may be ready for discharge prior 03/02/23 pending appropriate clinical response to IV antibiotics.  If appropriate response is not achieved patient may require surgical intervention.  Anticipate will discharge home with wife and home health services such as PT or OT and possibly RN for long-term antibiotics to treat osteomyelitis if indicated.  Chief Complaint:  Chief Complaint  Patient presents with   Fever   HPI: [name] is a 53 y.o. male with medical history significant of diabetes mellitus 2 on multiple agents, dyslipidemia, hypertension, morbid obesity, chronic kidney disease stage II, chronic mild elevation in LFTs, pituitary adenoma and history of ray amputation right foot.  Patient reports that he was in his usual state of health when he noticed a blister on the tip of his left great toe about 3 weeks prior.  He was treating at home with antibiotic ointment and noted the area seemed to improve then he developed drainage.  He was also having some discomfort which is atypical for him given his peripheral neuropathy.  He applied Voltaren  cream and this helped.  He was at work today when he developed chills and rigors.  He was sent home due to concerns he may have influenza.  He presented to the ED because of drainage in his foot and he wA concerned that symptoms were from the toe.  Imaging did reveal acute osteomyelitis involving the left great toe as well as significant soft tissue swelling of the great toe with area suspicious for ulcer and sinus tract.  Patient presented with fever near 102 and had associated tachycardia.  Patient was presumptively treated for sepsis physiology but lactic acid has remained normal at 1.6 and 0.8.   Patient has mild chronic kidney disease stage II with slight worsening in renal function from his baseline.  Transaminitis is at baseline as well.  Hospitalists service has been asked to evaluate the patient for admission.  Upon my evaluation of the patient he confirmed review of systems as above.  He has previously been treated by Dr. Tobie for his right foot ulcers and wounds which led to subsequent ray amputation.  He has also been seen by Dr. Delon Elder with wound care and has had hyperbaric oxygen  treatments.  He has not been on any recent antibiotics.  His most recent hemoglobin A1c 01/30/2023 had significantly improved to 6.5.  He checks his glucose readings regularly at home and pulled up his phone app to demonstrate appropriate glycemic control.  Review of Systems: As mentioned in the history of present illness. All other systems reviewed and are negative.  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   (06-17-2019  pt stated has been stable with no changes and followed by pcp)   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HCC)    11-06-2016  lower extremity angiography (dr eliza)  mil tibial disease w/ occlusion of the distal peroneal artery and dorsalis pedis artery but has widely patent posterior tibial and anterior tibial arterties   Type 2 diabetes mellitus treated with insulin  (HCC)    followed by pcp   (06-17-2019  pt stated checks blood sugar daily in am,   fasting sugar-- average 71)   Vitamin B 12 deficiency    Past Surgical History:  Procedure Laterality Date   AMPUTATION Right 07/31/2016   Procedure: AMPUTATION RIGHT FIFTH RAY AND APPLICATION OF WOUND VAC;  Surgeon: Yvone Rush, MD;  Location: [facility];  Service: Orthopedics;  Laterality: Right;   AMPUTATION Right 08/08/2016   Procedure: RAY AMPUTATION RIGHT FOURTH METATARSAL, DEBRIDEMENT OF SUBQ TISSUE,BONE AND SKIN WITH WOUND CLOSURE;  Surgeon: Yvone Rush, MD;  Location: [facility];  Service: Orthopedics;  Laterality: Right;   AMPUTATION TOE Left 06/21/2019   Procedure: AMPUTATION TOE SECOND LEFT, DEBRIDEMENT OF LEFT GREAT TOE;  Surgeon: Gretel Ozell PARAS, DPM;  Location: [facility];  Service: Podiatry;  Laterality: Left;   AMPUTATION TOE Right 09/03/2020   Procedure: AMPUTATION TOE;  Surgeon: Gretel Ozell PARAS, DPM;  Location: [facility];  Service: Podiatry;  Laterality: Right;   ANAL FISSURE REPAIR  2000   APPLICATION OF A-CELL OF EXTREMITY Right 08/13/2016   Procedure: APPLICATION OF A-CELL AND VAC;  Surgeon: Lowery Estefana RAMAN, DO;  Location: [facility];  Service: Plastics;  Laterality: Right;   APPLICATION OF A-CELL OF EXTREMITY Right 07/16/2017   Procedure: CELLERATE PLACEMENT;  Surgeon: Lowery Estefana RAMAN, DO;  Location: [facility];  Service: Plastics;  Laterality: Right;   APPLICATION OF WOUND VAC Right 09/18/2016   Procedure: APPLICATION OF WOUND VAC (PT. WILL BRING HIS OWN);  Surgeon: Lowery Estefana RAMAN, DO;  Location: [facility];  Service: Plastics;  Laterality: Right;   APPLICATION OF WOUND VAC Right 10/30/2016   Procedure: APPLICATION OF WOUND VAC;  Surgeon: Lowery Estefana RAMAN, DO;  Location: [facility];  Service: Plastics;  Laterality: Right;   APPLICATION OF WOUND VAC Right 12/01/2016   Procedure: APPLICATION OF WOUND VAC;  Surgeon: Lowery Estefana RAMAN, DO;  Location: [facility];  Service: Plastics;  Laterality: Right;    BONE BIOPSY Left 01/04/2018   Procedure: LEFT FOOT BONE BIOPSY;  Surgeon: Gretel Ozell PARAS, DPM;  Location: [facility];  Service: Podiatry;  Laterality: Left;   BONE BIOPSY Right 09/03/2020   Procedure: BONE BIOPSY;  Surgeon: Gretel Ozell PARAS, DPM;  Location: [facility];  Service: Podiatry;  Laterality: Right;   CIRCUMCISION  2006   COLONOSCOPY  04/08/2021   DEBRIDEMENT  FOOT Right 07/16/2017   ESOPHAGOGASTRODUODENOSCOPY (EGD) WITH PROPOFOL  N/A 11/07/2021   Procedure: ESOPHAGOGASTRODUODENOSCOPY (EGD) WITH PROPOFOL ;  Surgeon: San Sandor GAILS, DO;  Location: [facility];  Service: Gastroenterology;  Laterality: N/A;   GIVENS CAPSULE STUDY N/A 11/07/2021   Procedure: GIVENS CAPSULE STUDY;  Surgeon: San Sandor GAILS, DO;  Location: [facility];  Service: Gastroenterology;  Laterality: N/A;   I & D EXTREMITY Right 09/18/2016   Procedure: IRRIGATION AND DEBRIDEMENT OF RIGHT LATERAL DIABLTIC FOOT ULCER;  Surgeon: Lowery Estefana RAMAN, DO;  Location: [facility];  Service: Plastics;  Laterality: Right;   I & D EXTREMITY Right 03/11/2017   Procedure: IRRIGATION AND DEBRIDEMENT OF RIGHT LATERAL FOOT WOUND  WITH CELLERATE COLLAGEN;  Surgeon: Lowery Estefana RAMAN, DO;  Location: [facility];  Service: Plastics;  Laterality: Right;   INCISION AND DRAINAGE OF WOUND Right 08/13/2016   Procedure: IRRIGATION AND DEBRIDEMENT WOUND RIGHT FOOT;  Surgeon: Lowery Estefana RAMAN, DO;  Location: [facility];  Service: Plastics;  Laterality: Right;   IR PATIENT EVAL TECH 0-60 MINS  01/12/2020   LAPAROSCOPIC CHOLECYSTECTOMY  09-09-2006   dr lily   LOWER EXTREMITY ANGIOGRAPHY N/A 10/27/2016   Procedure: Lower Extremity Angiography;  Surgeon: Eliza Lonni RAMAN, MD;  Location: [facility];  Service: Cardiovascular;  Laterality: N/A;   MASS EXCISION Right 10/30/2016   Procedure: EXCISION RIGHT FOOT WOUND WITH VAC PLACEMENT;  Surgeon: Lowery Estefana RAMAN, DO;  Location: [facility];  Service: Plastics;  Laterality: Right;   MASS EXCISION Right 07/16/2017   Procedure: EXCISION OF RIGHT FOOT WOUND WITH CELLERATE PLACEMENT;  Surgeon: Lowery Estefana RAMAN, DO;  Location: [facility];  Service: Plastics;  Laterality: Right;   METATARSAL HEAD EXCISION Left 01/04/2018   Procedure: 5TH METATARSAL RESECTION,  WOUND CLOSURE,  Adjacent Tissue Transfer.;  Surgeon: Gretel Ozell PARAS, DPM;  Location: [facility];  Service: Podiatry;  Laterality: Left;   SKIN SPLIT GRAFT Right 12/01/2016   Procedure: SKIN GRAFT SPLIT THICKNESS TO RIGHT LATERAL FOOT WOUND 9 X 3 CM;  Surgeon: Lowery Estefana RAMAN, DO;  Location: [facility];  Service: Plastics;  Laterality: Right;   wisdom teeth extracted  1984   WOUND DEBRIDEMENT Right 09/03/2020   Procedure: DEBRIDEMENT WOUND, INSERTION OF ANTIBIOTIC BEADS;  Surgeon: Gretel Ozell PARAS, DPM;  Location: [facility];  Service: Podiatry;  Laterality: Right;   Social History:  reports that he has never smoked. He has never used smokeless tobacco. He reports that he does not drink alcohol and does not use drugs.  No Known Allergies  Family History  Problem Relation Age of Onset   Sarcoidosis Mother    Stroke Mother    Stroke Father        early 72s. former smoker   Diabetes Father    Hypertension Father    Hypertension Sister    Diabetes Paternal Grandmother    Congenital heart disease Paternal Grandmother        died of complications   Healthy Daughter    Colon cancer Neg Hx    Colon polyps Neg Hx    Esophageal cancer Neg Hx    Rectal cancer Neg Hx    Stomach cancer Neg Hx     Prior to Admission medications   Medication Sig Start Date End Date Taking? Authorizing Provider  ascorbic acid  (VITAMIN C ) 500 MG tablet Take 500 mg by mouth daily. 08/14/16  Yes [provider]  aspirin  EC 81 MG tablet Take 81 mg by mouth daily.   Yes [provider]  atorvastatin  (LIPITOR) 80 MG tablet TAKE 1 TABLET BY MOUTH EVERY DAY  07/09/22  Yes Katrinka Garnette KIDD, MD  Cyanocobalamin  (VITAMIN B-12) 5000 MCG SUBL Place 5,000 mcg under the tongue daily.   Yes [provider]  fenofibrate  (TRICOR ) 48 MG tablet TAKE 1 TABLET BY MOUTH EVERY DAY 10/13/22  Yes Katrinka Garnette KIDD, MD  Ferrous Sulfate  (IRON PO) Take 1 tablet by mouth daily.   Yes [provider]  hydrochlorothiazide  (MICROZIDE ) 12.5 MG capsule TAKE 1 CAPSULE BY MOUTH EVERY DAY IN THE MORNING 12/08/22  Yes Katrinka Garnette KIDD, MD  insulin  aspart (NOVOLOG  FLEXPEN) 100 UNIT/ML FlexPen  INJECT 15-25 UNITS INTO THE SKIN 3 TIMES DAILY WITH MEALS. PLEASE PROVIDE PEN NEEDLES IF POSSIBLE Patient taking differently: Inject 15-25 Units into the skin 2 (two) times daily. 06/12/22  Yes Katrinka Garnette KIDD, MD  insulin  degludec (TRESIBA  FLEXTOUCH) 200 UNIT/ML FlexTouch Pen INJECT 80-100 UNITS INTO THE SKIN DAILY. Patient taking differently: Inject 95 Units into the skin at bedtime. 12/01/22  Yes Katrinka Garnette KIDD, MD  Multiple Vitamin (MULTIVITAMIN WITH MINERALS) TABS tablet Take 1 tablet by mouth daily.   Yes [provider]  Multiple Vitamins-Minerals (ZINC  PO) Take 1 tablet by mouth daily.   Yes [provider]  naproxen  sodium (ALEVE ) 220 MG tablet Take 220-440 mg by mouth 2 (two) times daily as needed (Pain).   Yes [provider]  Omega-3 Fatty Acids (FISH OIL) 1360 MG CAPS Take 1,360 mg by mouth daily.    Yes [provider]  pyridoxine  (B-6) 100 MG tablet Take 100 mg by mouth daily.   Yes [provider]  ramipril  (ALTACE ) 5 MG capsule TAKE 1 CAPSULE BY MOUTH EVERY DAY IN THE MORNING 09/09/22  Yes Katrinka Garnette KIDD, MD  tirzepatide  (MOUNJARO ) 5 MG/0.5ML Pen Inject 5 mg into the skin once a week. 09/25/22  Yes Katrinka Garnette KIDD, MD    Physical Exam: Vitals:   02/25/23 1134 02/25/23 1248 02/25/23 1250 02/25/23 1535  BP: 105/60 100/67 100/65 120/66  Pulse: (!) 122 (!) 108 (!) 108 96  Resp: 18  14 16   Temp: (!) 101.5 F  (38.6 C) 100 F (37.8 C)  98.2 F (36.8 C)  TempSrc: Oral Oral    SpO2: 99% 100% 98% 100%   Constitutional: NAD, calm, comfortable Respiratory: clear to auscultation bilaterally, no wheezing, no crackles. Normal respiratory effort. No accessory muscle use.  Cardiovascular: Regular rate and rhythm, no murmurs / rubs / gallops. No extremity edema.  Both lower extremities are warm to the touch with appropriate capillary refill but I am unable to palpate pulses on either.  There is focal puffy nonpitting edema of the right medial malleolus. Abdomen: no tenderness, no masses palpated. No obvious hepatosplenomegaly. Bowel sounds positive.  Musculoskeletal: no clubbing / cyanosis. No joint deformity upper and lower extremities. Good ROM, no contractures. Normal muscle tone.  Skin: All skin unremarkable except for wound left great toe that is irregular in nature on the tip.  The tissue is white and serous drainage noted with foul odor.  Erythema with swelling noted at the base of the toe.  Deformed great toe nailbed and nail.  He also has a callus on the plantar surface of the foot about 3 inches below the great toe and the second toe.  This area is dry and no erythema or drainage noted.  Patient has a small linear blister on the right forearm which he believes he sustained when cooking grease splattered out of the pan onto his arm. Neurologic: CN 2-12 grossly intact. Sensation intact, DTR normal. Strength 5/5 x all 4 extremities.  Psychiatric: Normal judgment and insight. Alert and oriented x 3. Normal mood.    Data Reviewed:  Sodium 138, potassium 3.6, chloride 103, CO2 22, glucose 124, BUN 23, creatinine 1.78, AST 77, ALT 91, total bilirubin 1.4, GFR 45  Lactic acid 1.6 and 0.8  WBC 12.7 with left shift, hemoglobin 12.5, platelets 368,000, coags normal  Blood cultures have been obtained  Urinalysis has been ordered by the EDP as well as swab for influenza, RSV and COVID PCR.  Imaging as  above  Chest x-ray was obtained and showed no abnormalities including no focal opacities  Assessment and Plan: Acute osteomyelitis left great toe with associated gangrenous changes Patient's primary podiatrist has been consulted and will see the patient If appropriate, patient and wife wish to pursue conservative management initially.  They are agreeable to IV antibiotics and limited I&D.  They will need to discuss this option with the podiatrist. Initially pharmacy consulted to dose vancomycin  but given his GFR they have transitioned him to linezolid  He also has a history of enterococcal, Pseudomonas, and Proteus wound infections therefore he has been initiated on empiric meropenem  Wound care to be determined by podiatry team Patient has limited to no sensation on the lower extremity from the ankle down.  Have ordered neurovascular checks every 4 hours ABIs were normal in July 2024  SIRS Patient has early inflammatory changes related to infection and fever and has been initially treated as sepsis. His renal function is only slightly worsened from baseline and he has chronic transaminitis which is near baseline. Lactic acid has been normal x 2 and at this time meets criteria for SIRS and evolving sepsis Continue to monitor closely.  Follow CRP and lactic acid Treat underlying causes as above EDP ordered lactated Ringer 's infusion at 150 cc/h x 20 hours-follow urine output closely  Diabetes mellitus 2 Continue home medications: Tresiba  95 units at bedtime, utilize hospital sliding scale insulin  and follow CBGs 3 times daily with meals and at bedtime, continue weekly Mounjaro ; unclear if still on Jardiance but will hold while acutely ill  Chronic transaminitis LFTs 01/30/2023 AST 46 and ALT 59 with a total bilirubin of 1.1 Presenting LFTs AST 77, ALT 91 and total bilirubin 1.4 Repeat labs in a.m. Was on statin prior to admission No recent abdominal imaging but suspect patient may have  underlying fatty liver disease  HLD Continue omega-3 fatty acids and statin-if LFTs worsen will need to hold statin  Mild acute renal insufficiency on chronic kidney disease stage II Baseline renal function BUN 22 with a creatinine of 1.45 and a GFR of 55 Presenting BUN 23 with a creatinine of 1.78 with similar GFR Continue to follow labs while acutely ill  Hypertension Will hold any diuretics or ACE inhibitors for the first 24 hours and can reevaluate at that time Will provide low-dose hydralazine  IV for breakthrough hypertension  Obesity BMI 41.6 Patient has achieved modest weight loss on current Mounjaro  dose with minimal side effects Albumin at presentation 3.6 with baseline 4.3-4.7-low albumin likely related to acute illness    Advance Care Planning:   Code Status: Full Code -confirmed with wife and patient at the bedside  VTE prophylaxis: Lovenox   Consults: Podiatry  Family Communication: Wife at bedside  Severity of Illness: The appropriate patient status for this patient is INPATIENT. Inpatient status is judged to be reasonable and necessary in order to provide the required intensity of service to ensure the patient's safety. The patient's presenting symptoms, physical exam findings, and initial radiographic and laboratory data in the context of their chronic comorbidities is felt to place them at high risk for further clinical deterioration. Furthermore, it is not anticipated that the patient will be medically stable for discharge from the hospital within 2 midnights of admission.   * I certify that at the point of admission it is my clinical judgment that the patient will require inpatient hospital care spanning beyond 2 midnights from the point of admission due to high intensity of service, high risk  for further deterioration and high frequency of surveillance required.*  Author: Isaiah Lever, NP 02/25/2023 4:33 PM  For on call review [email or web address].

## 2023-02-25 NOTE — ED Triage Notes (Addendum)
 Patient presents due to a fever and a blister on his left 1st toes. While at work today, he noticed he could not warm up. His temp was checked there and it was 100.1 around 0915. He reports the blister popped about 3 weeks ago and looks horrible around it and has been draining a lot the past couple of days. Patient reports shortness of breath when he has chills.   HX Diabetic

## 2023-02-25 NOTE — ED Provider Notes (Signed)
 [place] [facility] AT [facility] Provider Note   CSN: [id number] Arrival date & time: 02/25/23  1122     History  Chief Complaint  Patient presents with   Fever    [name] is a 53 y.o. male.  The history is provided by the patient and medical records. No language interpreter was used.  Fever    53 year old male history diabetes, hypertension, obesity, diabetic neuropathy, CKD presenting with complaint of foot pain. Patient coming in for changes in their left 1st toe as well as low grade fever.  Symptoms ongoing for the past 3 weeks.  Patient states he monitor his toe daily and have been putting dressing and protecting the wound but is becoming progressively worse.  He endorsed increasing throbbing pain to the affected area.  He also noticed drainage coming from his toe.  Pt has history of peripheral neuropathy due to diabetes and has had toe debridement/amputation done in the right foot in the past. Pt denies N/V/D, chest pain, SHOB, or other flu flike symptoms. Denies urinary changes.  He mention his diabetes is well-controlled.  Home Medications Prior to Admission medications   Medication Sig Start Date End Date Taking? Authorizing Provider  ascorbic acid  (VITAMIN C ) 500 MG tablet Take 500 mg by mouth daily. 08/14/16   [provider]  aspirin  EC 81 MG tablet Take 81 mg by mouth daily.    [provider]  atorvastatin  (LIPITOR) 80 MG tablet TAKE 1 TABLET BY MOUTH EVERY DAY 07/09/22   Katrinka Garnette KIDD, MD  Cyanocobalamin  (VITAMIN B-12) 5000 MCG SUBL Place 5,000 mcg under the tongue daily.    [provider]  fenofibrate  (TRICOR ) 48 MG tablet TAKE 1 TABLET BY MOUTH EVERY DAY 10/13/22   Katrinka Garnette KIDD, MD  hydrochlorothiazide  (MICROZIDE ) 12.5 MG capsule TAKE 1 CAPSULE BY MOUTH EVERY DAY IN THE MORNING 12/08/22   Katrinka Garnette KIDD, MD  ibuprofen  (ADVIL ) 800 MG tablet Take 1 tablet (800 mg total) by mouth every 6 (six)  hours as needed. 09/04/22   Tobie Franky SQUIBB, DPM  insulin  aspart (NOVOLOG  FLEXPEN) 100 UNIT/ML FlexPen INJECT 15-25 UNITS INTO THE SKIN 3 TIMES DAILY WITH MEALS. PLEASE PROVIDE PEN NEEDLES IF POSSIBLE 06/12/22   Katrinka Garnette KIDD, MD  insulin  degludec (TRESIBA  FLEXTOUCH) 200 UNIT/ML FlexTouch Pen INJECT 80-100 UNITS INTO THE SKIN DAILY. 12/01/22   Katrinka Garnette KIDD, MD  Insulin  Pen Needle 31G X 8 MM MISC Use to inject insulin  daily. Dx: E11.9 06/23/22   Katrinka Garnette KIDD, MD  Multiple Vitamin (MULTIVITAMIN WITH MINERALS) TABS tablet Take 1 tablet by mouth 2 (two) times daily.    [provider]  Omega-3 Fatty Acids (FISH OIL) 1360 MG CAPS Take 1,360 mg by mouth daily.     [provider]  oxyCODONE -acetaminophen  (PERCOCET) 5-325 MG tablet Take 1 tablet by mouth every 4 (four) hours as needed for severe pain. 09/04/22   Tobie Franky SQUIBB, DPM  pyridoxine  (B-6) 100 MG tablet Take 100 mg by mouth 2 (two) times daily.    [provider]  ramipril  (ALTACE ) 5 MG capsule TAKE 1 CAPSULE BY MOUTH EVERY DAY IN THE MORNING 09/09/22   Katrinka Garnette KIDD, MD  tirzepatide  (MOUNJARO ) 5 MG/0.5ML Pen Inject 5 mg into the skin once a week. 09/25/22   Katrinka Garnette KIDD, MD      Allergies    Patient has no known allergies.    Review of Systems   Review of Systems  Constitutional:  Positive for fever.  All other systems reviewed and are negative.   Physical Exam Updated Vital Signs BP 105/60 (BP Location: Left Arm)   Pulse (!) 122   Temp (!) 101.5 F (38.6 C) (Oral)   Resp 18   SpO2 99%  Physical Exam Vitals and nursing note reviewed.  Constitutional:      General: He is not in acute distress.    Appearance: He is well-developed.  HENT:     Head: Atraumatic.  Eyes:     Conjunctiva/sclera: Conjunctivae normal.  Cardiovascular:     Rate and Rhythm: Tachycardia present.     Pulses: Normal pulses.     Heart sounds: Normal heart sounds.  Pulmonary:     Effort: Pulmonary effort is  normal.     Breath sounds: Normal breath sounds.  Abdominal:     Palpations: Abdomen is soft.  Musculoskeletal:     Cervical back: Neck supple.  Skin:    Findings: No rash.     Comments: Left foot: gangrene noted to left great toe with strong foul odor noted. I am able to express some serous discharge from wound at the pad of toe. Sensation is diminished.  Intact dorsalis pedis pulse  Neurological:     Mental Status: He is alert. Mental status is at baseline.     ED Results / Procedures / Treatments   Labs (all labs ordered are listed, but only abnormal results are displayed) Labs Reviewed  COMPREHENSIVE METABOLIC PANEL - Abnormal; Notable for the following components:      Result Value   Glucose, Bld 124 (*)    BUN 23 (*)    Creatinine, Ser 1.78 (*)    Total Protein 8.6 (*)    AST 77 (*)    ALT 91 (*)    Total Bilirubin 1.4 (*)    GFR, Estimated 45 (*)    All other components within normal limits  CBC WITH DIFFERENTIAL/PLATELET - Abnormal; Notable for the following components:   WBC 12.7 (*)    Hemoglobin 12.5 (*)    MCH 25.8 (*)    Neutro Abs 11.3 (*)    All other components within normal limits  CULTURE, BLOOD (ROUTINE X 2)  CULTURE, BLOOD (ROUTINE X 2)  RESP PANEL BY RT-PCR (RSV, FLU A&B, COVID)  RVPGX2  PROTIME-INR  APTT  URINALYSIS, W/ REFLEX TO CULTURE (INFECTION SUSPECTED)  I-STAT CG4 LACTIC ACID, ED  I-STAT CG4 LACTIC ACID, ED    EKG EKG Interpretation Date/Time:  Wednesday February 25 2023 12:09:51 EST Ventricular Rate:  118 PR Interval:  144 QRS Duration:  81 QT Interval:  317 QTC Calculation: 445 R Axis:   42  Text Interpretation: Sinus tachycardia Borderline repolarization abnormality Baseline wander in lead(s) III aVL Confirmed by Freddi Hamilton [phone]) on 02/25/2023 12:32:44 PM  Radiology DG Foot Complete Left Result Date: 02/25/2023 CLINICAL DATA:  Great toe infection.  Assess for osteomyelitis. EXAM: LEFT FOOT - COMPLETE 3+ VIEW COMPARISON:   Left foot radiographs 07/22/2019 and 06/21/2019 FINDINGS: Redemonstration of amputation of the second toe middle and distal phalanges and the distal aspect of the fifth metatarsal. There is moderate great toe soft tissue swelling. Irregularity of the distal great toe soft tissues with linear lucency tracking from the distal plantar aspect of the great toe towards the distal aspect of the distal phalanx suspicious for an ulcer and sinus tract containing air. There is new mild cortical erosion at the distal aspect of the distal phalanx with  a curvilinear lucency that may represent bone fragmentation. Mild-to-moderate plantar calcaneal heel spur with additional 18 mm length mineralization overlying the proximal plantar aponeurosis on lateral view. Mild chronic enthesopathic change at the Achilles insertion on the calcaneus. IMPRESSION: 1. Moderate great toe soft tissue swelling with irregularity of the distal great toe soft tissues suspicious for an ulcer and sinus tract. 2. New mild cortical erosion at the distal aspect of the great toe distal phalanx with a curvilinear lucency that may represent bone fragmentation. This is suspicious for acute osteomyelitis. 3. Redemonstration of amputation of the second toe middle and distal phalanges and the distal aspect of the fifth metatarsal. Electronically Signed   By: Tanda Lyons M.D.   On: 02/25/2023 13:57    Procedures .Critical Care  Performed by: Nivia Colon, PA-C Authorized by: Nivia Colon, PA-C   Critical care provider statement:    Critical care time (minutes):  40   Critical care was time spent personally by me on the following activities:  Development of treatment plan with patient or surrogate, discussions with consultants, evaluation of patient's response to treatment, examination of patient, ordering and review of laboratory studies, ordering and review of radiographic studies, ordering and performing treatments and interventions, pulse oximetry,  re-evaluation of patient's condition and review of old charts     Medications Ordered in ED Medications  acetaminophen  (TYLENOL ) tablet 650 mg (has no administration in time range)  lactated ringers  infusion (has no administration in time range)  lactated ringers  bolus 1,000 mL (has no administration in time range)    And  lactated ringers  bolus 1,000 mL (has no administration in time range)    And  lactated ringers  bolus 1,000 mL (has no administration in time range)    And  lactated ringers  bolus 1,000 mL (has no administration in time range)    And  lactated ringers  bolus 500 mL (has no administration in time range)  cefTRIAXone  (ROCEPHIN ) 2 g in sodium chloride  0.9 % 100 mL IVPB (has no administration in time range)  metroNIDAZOLE  (FLAGYL ) tablet 500 mg (has no administration in time range)  vancomycin  (VANCOREADY) IVPB 2000 mg/400 mL (has no administration in time range)    ED Course/ Medical Decision Making/ A&P                                 Medical Decision Making Amount and/or Complexity of Data Reviewed Labs: ordered. Radiology: ordered.  Risk OTC drugs. Prescription drug management.   BP 105/60 (BP Location: Left Arm)   Pulse (!) 122   Temp (!) 101.5 F (38.6 C) (Oral)   Resp 18   SpO2 99%   38:18 PM  53 year old male history diabetes, hypertension, obesity, diabetic neuropathy, CKD presenting with complaint of foot pain. Patient coming in for changes in their left 1st toe as well as low grade fever.  Symptoms ongoing for the past 3 weeks.  Patient states he monitor his toe daily and have been putting dressing and protecting the wound but is becoming progressively worse.  He endorsed increasing throbbing pain to the affected area.  He also noticed drainage coming from his toe.  Pt has history of peripheral neuropathy due to diabetes and has had toe debridement/amputation done in the right foot in the past. Pt denies N/V/D, chest pain, SHOB, or other flu flike  symptoms. Denies urinary changes.  He mention his diabetes is well-controlled.  On exam, patient is  resting comfortably appears to be in no acute discomfort.  Left foot exam notable for wet gangrene involving the great toe with significant swelling to the toe, open wound to the pad of the toe with strong foul odor and serous drainage.  It is mildly tender to palpation but sensation is diminished.  He has palpable dorsalis pedis pulse.  Vitals are notable for an elevated temperature of 101.5, tachycardia with heart rate 122 and current blood pressure is 105/60.  Code sepsis initiated, I have ordered antibiotic which includes Rocephin , vancomycin , and metronidazole .  -Labs ordered, independently viewed and interpreted by me.  Labs remarkable for elevated white count of 12.7 fortunately normal lactic acid therefore patient does not need fluid resuscitation at 30 mL/g.  Evidence of AKI with a creatinine of 1.78, mild transaminitis with AST 77, ALT 91, total bili of 1.4. -The patient was maintained on a cardiac monitor.  I personally viewed and interpreted the cardiac monitored which showed an underlying rhythm of: Sinus tachycardia -Imaging independently viewed and interpreted by me and I agree with radiologist's interpretation.  Result remarkable for x-ray of left foot demonstrating evidence of osteomyelitis of the left great toe -This patient presents to the ED for concern of toe infection, this involves an extensive number of treatment options, and is a complaint that carries with it a high risk of complications and morbidity.  The differential diagnosis includes osteomyelitis, diabetic foot ulcer, septic arthritis, abscess, cellulitis, ischemic toe -Co morbidities that complicate the patient evaluation includes diabetes, diabetic neuropathy, hypertension -Treatment includes antibiotic which including Rocephin , vancomycin , metronidazole , Tylenol  -Reevaluation of the patient after these medicines showed  that the patient improved -PCP office notes or outside notes reviewed -Discussion with specialist triad hospitalist Isaiah, NP who agrees to see and will admit pt -Escalation to admission/observation considered: patient is agreeable with admission         Final Clinical Impression(s) / ED Diagnoses Final diagnoses:  Osteomyelitis of great toe of left foot (HCC)  Sepsis, due to unspecified organism, unspecified whether acute organ dysfunction present [facility])    Rx / DC Orders ED Discharge Orders     None         Nivia Colon, PA-C 02/25/23 1435    Freddi Hamilton, MD 02/26/23 1451

## 2023-02-25 NOTE — ED Notes (Signed)
 Patient transported to X-ray

## 2023-02-25 NOTE — Telephone Encounter (Signed)
Pt's wife  °

## 2023-02-26 DIAGNOSIS — E11628 Type 2 diabetes mellitus with other skin complications: Secondary | ICD-10-CM | POA: Diagnosis not present

## 2023-02-26 DIAGNOSIS — L089 Local infection of the skin and subcutaneous tissue, unspecified: Secondary | ICD-10-CM | POA: Diagnosis not present

## 2023-02-26 LAB — GLUCOSE, CAPILLARY
Glucose-Capillary: 54 mg/dL — ABNORMAL LOW (ref 70–99)
Glucose-Capillary: 65 mg/dL — ABNORMAL LOW (ref 70–99)
Glucose-Capillary: 85 mg/dL (ref 70–99)
Glucose-Capillary: 113 mg/dL — ABNORMAL HIGH (ref 70–99)
Glucose-Capillary: 65 mg/dL — ABNORMAL LOW (ref 70–99)
Glucose-Capillary: 109 mg/dL — ABNORMAL HIGH (ref 70–99)
Glucose-Capillary: 132 mg/dL — ABNORMAL HIGH (ref 70–99)

## 2023-02-26 LAB — BLOOD CULTURE ID PANEL (REFLEXED) - BCID2

## 2023-02-26 LAB — SURGICAL PCR SCREEN
MRSA, PCR: NEGATIVE
Staphylococcus aureus: NEGATIVE

## 2023-02-26 LAB — COMPREHENSIVE METABOLIC PANEL
ALT: 98 U/L — ABNORMAL HIGH (ref 0–44)
AST: 74 U/L — ABNORMAL HIGH (ref 15–41)
Albumin: 3 g/dL — ABNORMAL LOW (ref 3.5–5.0)
Alkaline Phosphatase: 99 U/L (ref 38–126)
Anion gap: 12 (ref 5–15)
BUN: 20 mg/dL (ref 6–20)
CO2: 23 mmol/L (ref 22–32)
Calcium: 8.9 mg/dL (ref 8.9–10.3)
Chloride: 100 mmol/L (ref 98–111)
Creatinine, Ser: 1.61 mg/dL — ABNORMAL HIGH (ref 0.61–1.24)
GFR, Estimated: 51 mL/min — ABNORMAL LOW (ref >60)
Glucose, Bld: 84 mg/dL (ref 70–99)
Potassium: 3.5 mmol/L (ref 3.5–5.1)
Sodium: 135 mmol/L (ref 135–145)
Total Bilirubin: 1.4 mg/dL — ABNORMAL HIGH (ref 0.0–1.2)
Total Protein: 7.3 g/dL (ref 6.5–8.1)

## 2023-02-26 LAB — CBC
HCT: 35.8 % — ABNORMAL LOW (ref 39.0–52.0)
Hemoglobin: 11.2 g/dL — ABNORMAL LOW (ref 13.0–17.0)
MCH: 25.7 pg — ABNORMAL LOW (ref 26.0–34.0)
MCHC: 31.3 g/dL (ref 30.0–36.0)
MCV: 82.3 fL (ref 80.0–100.0)
Platelets: 313 10*3/uL (ref 150–400)
RBC: 4.35 MIL/uL (ref 4.22–5.81)
RDW: 12.9 % (ref 11.5–15.5)
WBC: 8.9 10*3/uL (ref 4.0–10.5)
nRBC: 0 % (ref 0.0–0.2)

## 2023-02-26 LAB — HIV ANTIBODY (ROUTINE TESTING W REFLEX): HIV Screen 4th Generation wRfx: NONREACTIVE

## 2023-02-26 MED ORDER — GLUCOSE 40 % PO GEL
2.0000 | ORAL | Status: AC
  Administered 2023-02-26: 62 g via ORAL
  Filled 2023-02-26: qty 2.42

## 2023-02-26 MED ORDER — PIPERACILLIN-TAZOBACTAM 3.375 G IVPB
3.3750 g | Freq: Three times a day (TID) | INTRAVENOUS | Status: DC
  Administered 2023-02-26 – 2023-02-27 (×3): 3.375 g via INTRAVENOUS
  Filled 2023-02-26 (×3): qty 50

## 2023-02-26 NOTE — Progress Notes (Signed)
 PHARMACY - PHYSICIAN COMMUNICATION CRITICAL VALUE ALERT - BLOOD CULTURE IDENTIFICATION (BCID)  [name] is an 53 y.o. male who presented to [facility] on 02/25/2023 with OM of the left great toe.   Assessment: Podiatry consulted BCID + 1/4 staph species  Name of physician (or Provider) Contacted: Dr. Madelyne  Current antibiotics: Linezolid  + piperacillin nadine  Changes to prescribed antibiotics recommended: None. This could be pathogen vs. Contaminant. Adequately covered with linezolid .   Results for orders placed or performed during the hospital encounter of 02/25/23  Blood Culture ID Panel (Reflexed) (Collected: 02/25/2023 12:00 PM)  Result Value Ref Range   Enterococcus faecalis NOT DETECTED NOT DETECTED   Enterococcus Faecium NOT DETECTED NOT DETECTED   Listeria monocytogenes NOT DETECTED NOT DETECTED   Staphylococcus species DETECTED (A) NOT DETECTED   Staphylococcus aureus (BCID) NOT DETECTED NOT DETECTED   Staphylococcus epidermidis NOT DETECTED NOT DETECTED   Staphylococcus lugdunensis NOT DETECTED NOT DETECTED   Streptococcus species NOT DETECTED NOT DETECTED   Streptococcus agalactiae NOT DETECTED NOT DETECTED   Streptococcus pneumoniae NOT DETECTED NOT DETECTED   Streptococcus pyogenes NOT DETECTED NOT DETECTED   A.calcoaceticus-baumannii NOT DETECTED NOT DETECTED   Bacteroides fragilis NOT DETECTED NOT DETECTED   Enterobacterales NOT DETECTED NOT DETECTED   Enterobacter cloacae complex NOT DETECTED NOT DETECTED   Escherichia coli NOT DETECTED NOT DETECTED   Klebsiella aerogenes NOT DETECTED NOT DETECTED   Klebsiella oxytoca NOT DETECTED NOT DETECTED   Klebsiella pneumoniae NOT DETECTED NOT DETECTED   Proteus species NOT DETECTED NOT DETECTED   Salmonella species NOT DETECTED NOT DETECTED   Serratia marcescens NOT DETECTED NOT DETECTED   Haemophilus influenzae NOT DETECTED NOT DETECTED   Neisseria meningitidis NOT DETECTED NOT DETECTED   Pseudomonas  aeruginosa NOT DETECTED NOT DETECTED   Stenotrophomonas maltophilia NOT DETECTED NOT DETECTED   Candida albicans NOT DETECTED NOT DETECTED   Candida auris NOT DETECTED NOT DETECTED   Candida glabrata NOT DETECTED NOT DETECTED   Candida krusei NOT DETECTED NOT DETECTED   Candida parapsilosis NOT DETECTED NOT DETECTED   Candida tropicalis NOT DETECTED NOT DETECTED   Cryptococcus neoformans/gattii NOT DETECTED NOT DETECTED    Ronal CHRISTELLA Rav, PharmD 02/26/2023  4:14 PM

## 2023-02-26 NOTE — Progress Notes (Signed)
 Hypoglycemic Event  CBG: 65  Treatment: 8 oz OJ and pt's dinner meal  Symptoms: none  Follow-up CBG: Time:1734 CBG Result: 109  Possible Reasons for Event: Pt did not each lunch.    [name]

## 2023-02-26 NOTE — Progress Notes (Signed)
 PROGRESS NOTE    [name]  FMW:[id number] DOB: 1970-11-02 DOA: 02/25/2023 PCP: Katrinka Garnette KIDD, MD   Brief Narrative: 53 year old with past medical history significant for CKD stage II, diabetes type 2, hypertension, morbid obesity, chronic mild elevation of liver function test, pituitary adenoma, history of ray amputation of the right foot presents with left great toe swelling and blister that is started 3 weeks prior to admission.  Patient was noted to be febrile, tachycardic tachypneic.  Admitted with sepsis secondary to left great toe osteomyelitis.   Assessment & Plan:   Principal Problem:   Diabetic infection of left foot (HCC) Active Problems:   Acute renal failure superimposed on stage 3a chronic kidney disease (HCC)   Osteomyelitis of great toe of left foot (HCC)   1-Sepsis secondary to Acute osteomyelitis of the left great toe, POA -Continue with IV fluids.  -Continue with IV antibiotics: Zosyn  and Linezolid .  -MRI left Foot: Plantar soft tissue ulceration at the tip of the great toe with sinus tract extending along the inferior aspect of the first distal phalanx. Associated osteomyelitis of the first distal phalanx and probable first IP joint septic arthritis. Complete tear of the flexor hallucis longus tendon at the base of the first distal phalanx. Small 1 cm abscess overlying the distal extensor hallucis longus tendon. -appreciate Podiatry consultation , plan for sx tomorrow  AKI on CKD stage II: Previous creatinine baseline 1.4 Cr down to 1.6--1.7 Continue with IV fluids.   Diabetes type 2, hypoglycemia.  Continue to hold Tresiba .  SSI   Chronic transaminates Hold statins.   Hyperlipidemia Holding statins due to transaminases.   Hypertension Holding ramipril /HCTZ due to AKI PR hydralazine .   Morbid obesity Needs life style modification.   Estimated body mass index is 41.77 kg/m as calculated from the following:   Height as of 01/30/23:  6' (1.829 m).   Weight as of this encounter: 139.7 kg.   DVT prophylaxis: Lovenox  Code Status: Full code Family Communication: care discussed with patient Disposition Plan:  Status is: Inpatient Remains inpatient appropriate because: management of Osteomyelitis.     Consultants:  Podiatry  Procedures:  none Antimicrobials:    Subjective: No new complaints. He is aware plan for sx tomorrow.  He has been eating well.    Objective: Vitals:   02/25/23 1535 02/25/23 2216 02/26/23 0133 02/26/23 0548  BP: 120/66 128/68 115/69 112/64  Pulse: 96 93 90 91  Resp: 16 18 19 19   Temp: 98.2 F (36.8 C) 99.7 F (37.6 C) 100 F (37.8 C) 99.1 F (37.3 C)  TempSrc:  Oral Oral Oral  SpO2: 100% 100% 100% 100%  Weight:    (!) 139.7 kg    Intake/Output Summary (Last 24 hours) at 02/26/2023 0741 Last data filed at 02/26/2023 0550 Gross per 24 hour  Intake 3029 ml  Output 400 ml  Net 2629 ml   Filed Weights   02/26/23 0548  Weight: (!) 139.7 kg    Examination:  General exam: Appears calm and comfortable  Respiratory system: Clear to auscultation. Respiratory effort normal. Cardiovascular system: S1 & S2 heard, RRR. No JVD, murmurs, rubs, gallops or clicks. No pedal edema. Gastrointestinal system: Abdomen is nondistended, soft and nontender. No organomegaly or masses felt. Normal bowel sounds heard. Central nervous system: Alert and oriented. No focal neurological deficits. Extremities: left foot with dressing  Data Reviewed: I have personally reviewed following labs and imaging studies  CBC: Recent Labs  Lab 02/25/23 1159 02/25/23 1834 02/26/23  0516  WBC 12.7* 13.2* 8.9  NEUTROABS 11.3*  --   --   HGB 12.5* 12.6* 11.2*  HCT 40.7 41.1 35.8*  MCV 83.9 86.0 82.3  PLT 368 306 313   Basic Metabolic Panel: Recent Labs  Lab 02/25/23 1159 02/26/23 0516  NA 138 135  K 3.6 3.5  CL 103 100  CO2 22 23  GLUCOSE 124* 84  BUN 23* 20  CREATININE 1.78* 1.61*  CALCIUM  9.3  8.9   GFR: Estimated Creatinine Clearance: 77.7 mL/min (A) (by C-G formula based on SCr of 1.61 mg/dL (H)). Liver Function Tests: Recent Labs  Lab 02/25/23 1159 02/26/23 0516  AST 77* 74*  ALT 91* 98*  ALKPHOS 114 99  BILITOT 1.4* 1.4*  PROT 8.6* 7.3  ALBUMIN 3.6 3.0*   No results for input(s): LIPASE, AMYLASE in the last 168 hours. No results for input(s): AMMONIA in the last 168 hours. Coagulation Profile: Recent Labs  Lab 02/25/23 1159  INR 1.0   Cardiac Enzymes: No results for input(s): CKTOTAL, CKMB, CKMBINDEX, TROPONINI in the last 168 hours. BNP (last 3 results) No results for input(s): PROBNP in the last 8760 hours. HbA1C: No results for input(s): HGBA1C in the last 72 hours. CBG: Recent Labs  Lab 02/25/23 1647 02/25/23 2325 02/26/23 0730  GLUCAP 75 93 65*   Lipid Profile: No results for input(s): CHOL, HDL, LDLCALC, TRIG, CHOLHDL, LDLDIRECT in the last 72 hours. Thyroid Function Tests: No results for input(s): TSH, T4TOTAL, FREET4, T3FREE, THYROIDAB in the last 72 hours. Anemia Panel: No results for input(s): VITAMINB12, FOLATE, FERRITIN, TIBC, IRON, RETICCTPCT in the last 72 hours. Sepsis Labs: Recent Labs  Lab 02/25/23 1211 02/25/23 1500  LATICACIDVEN 1.6 0.8    Recent Results (from the past 240 hours)  Culture, blood (Routine x 2)     Status: None (Preliminary result)   Collection Time: 02/25/23 12:00 PM   Specimen: BLOOD  Result Value Ref Range Status   Specimen Description   Final    BLOOD SITE NOT SPECIFIED Performed at [facility], [address] [place], [place] [address]    Special Requests   Final    BOTTLES DRAWN AEROBIC AND ANAEROBIC Blood Culture adequate volume Performed at [facility], [address] [place], [place] [address]    Culture   Final    NO GROWTH < 24 HOURS Performed at [facility], [address], [place], [place] [address]     Report Status PENDING  Incomplete  Culture, blood (Routine x 2)     Status: None (Preliminary result)   Collection Time: 02/25/23 12:00 PM   Specimen: BLOOD  Result Value Ref Range Status   Specimen Description   Final    BLOOD SITE NOT SPECIFIED Performed at [facility], [address], [place], [place] [address]    Special Requests   Final    BOTTLES DRAWN AEROBIC AND ANAEROBIC Blood Culture adequate volume Performed at [facility], [address] [place], [place] [address]    Culture   Final    NO GROWTH < 24 HOURS Performed at [facility], [address] [place], [place] [address]    Report Status PENDING  Incomplete         Radiology Studies: DG Chest 2 View Result Date: 02/25/2023 CLINICAL DATA:  Suspected sepsis. EXAM: CHEST - 2 VIEW COMPARISON:  08/13/2021 FINDINGS: Low lung volumes. The heart size and mediastinal contours are  within normal limits. Both lungs are clear. The visualized skeletal structures are unremarkable. IMPRESSION: No active cardiopulmonary disease. Electronically Signed   By: Norman Gatlin M.D.   On: 02/25/2023 14:36   DG Foot Complete Left Result Date: 02/25/2023 CLINICAL DATA:  Great toe infection.  Assess for osteomyelitis. EXAM: LEFT FOOT - COMPLETE 3+ VIEW COMPARISON:  Left foot radiographs 07/22/2019 and 06/21/2019 FINDINGS: Redemonstration of amputation of the second toe middle and distal phalanges and the distal aspect of the fifth metatarsal. There is moderate great toe soft tissue swelling. Irregularity of the distal great toe soft tissues with linear lucency tracking from the distal plantar aspect of the great toe towards the distal aspect of the distal phalanx suspicious for an ulcer and sinus tract containing air. There is new mild cortical erosion at the distal aspect of the distal phalanx with a curvilinear lucency that may represent bone fragmentation. Mild-to-moderate plantar calcaneal heel spur with  additional 18 mm length mineralization overlying the proximal plantar aponeurosis on lateral view. Mild chronic enthesopathic change at the Achilles insertion on the calcaneus. IMPRESSION: 1. Moderate great toe soft tissue swelling with irregularity of the distal great toe soft tissues suspicious for an ulcer and sinus tract. 2. New mild cortical erosion at the distal aspect of the great toe distal phalanx with a curvilinear lucency that may represent bone fragmentation. This is suspicious for acute osteomyelitis. 3. Redemonstration of amputation of the second toe middle and distal phalanges and the distal aspect of the fifth metatarsal. Electronically Signed   By: Tanda Lyons M.D.   On: 02/25/2023 13:57        Scheduled Meds:  aspirin  EC  81 mg Oral Daily   enoxaparin  (LOVENOX ) injection  60 mg Subcutaneous Q24H   fenofibrate   54 mg Oral Daily   insulin  aspart  0-15 Units Subcutaneous TID WC   insulin  aspart  0-5 Units Subcutaneous QHS   omega-3 acid ethyl esters  1 g Oral Daily   sodium chloride  flush  3 mL Intravenous Q12H   Continuous Infusions:  lactated ringers  100 mL/hr at 02/26/23 0114   linezolid  (ZYVOX ) IV     meropenem  (MERREM ) IV 1,000 mg (02/26/23 0017)     LOS: 1 day    Time spent: 35 Minutes    Nelissa Bolduc A Johny Pitstick, MD [facility]   If 7PM-7AM, please contact night-coverage [email or web address]  02/26/2023, 7:41 AM

## 2023-02-26 NOTE — Plan of Care (Signed)

## 2023-02-26 NOTE — Progress Notes (Signed)
 Hypoglycemic Event  CBG: 65  Treatment: 8 oz OJ, graham crackers and peanut butter   Symptoms: none  Follow-up CBG: Time:0800 CBG Result: 54    Treatment: 2 tubes glucose gel  Symptoms: none  Follow-up CBG: Time:0827 CBG Result: 85    Possible Reasons for Event: unknown    [name] Like

## 2023-02-27 ENCOUNTER — Encounter (HOSPITAL_COMMUNITY): Payer: Self-pay | Admitting: Internal Medicine

## 2023-02-27 ENCOUNTER — Inpatient Hospital Stay (HOSPITAL_COMMUNITY): Payer: BLUE CROSS/BLUE SHIELD | Admitting: Anesthesiology

## 2023-02-27 ENCOUNTER — Inpatient Hospital Stay (HOSPITAL_COMMUNITY): Payer: BLUE CROSS/BLUE SHIELD

## 2023-02-27 ENCOUNTER — Other Ambulatory Visit: Payer: Self-pay

## 2023-02-27 ENCOUNTER — Encounter (HOSPITAL_COMMUNITY)
Admission: EM | Disposition: A | Discharge status: Home / Self Care | Payer: Self-pay | Source: Home / Self Care | Attending: Internal Medicine

## 2023-02-27 DIAGNOSIS — E11628 Type 2 diabetes mellitus with other skin complications: Secondary | ICD-10-CM | POA: Diagnosis not present

## 2023-02-27 DIAGNOSIS — M869 Osteomyelitis, unspecified: Secondary | ICD-10-CM | POA: Diagnosis not present

## 2023-02-27 DIAGNOSIS — L089 Local infection of the skin and subcutaneous tissue, unspecified: Secondary | ICD-10-CM | POA: Diagnosis not present

## 2023-02-27 HISTORY — PX: AMPUTATION TOE: SHX6595

## 2023-02-27 LAB — CBC
HCT: 34.7 % — ABNORMAL LOW (ref 39.0–52.0)
Hemoglobin: 11 g/dL — ABNORMAL LOW (ref 13.0–17.0)
MCH: 26.1 pg (ref 26.0–34.0)
MCHC: 31.7 g/dL (ref 30.0–36.0)
MCV: 82.2 fL (ref 80.0–100.0)
Platelets: 293 10*3/uL (ref 150–400)
RBC: 4.22 MIL/uL (ref 4.22–5.81)
RDW: 13.1 % (ref 11.5–15.5)
WBC: 7.1 10*3/uL (ref 4.0–10.5)
nRBC: 0 % (ref 0.0–0.2)

## 2023-02-27 LAB — BASIC METABOLIC PANEL
Anion gap: 7 (ref 5–15)
BUN: 16 mg/dL (ref 6–20)
CO2: 26 mmol/L (ref 22–32)
Calcium: 8.8 mg/dL — ABNORMAL LOW (ref 8.9–10.3)
Chloride: 103 mmol/L (ref 98–111)
Creatinine, Ser: 1.54 mg/dL — ABNORMAL HIGH (ref 0.61–1.24)
GFR, Estimated: 54 mL/min — ABNORMAL LOW (ref >60)
Glucose, Bld: 94 mg/dL (ref 70–99)
Potassium: 3.7 mmol/L (ref 3.5–5.1)
Sodium: 136 mmol/L (ref 135–145)

## 2023-02-27 LAB — BLOOD CULTURE ID PANEL (REFLEXED) - BCID2

## 2023-02-27 LAB — GLUCOSE, CAPILLARY
Glucose-Capillary: 70 mg/dL (ref 70–99)
Glucose-Capillary: 86 mg/dL (ref 70–99)
Glucose-Capillary: 40 mg/dL — CL (ref 70–99)
Glucose-Capillary: 53 mg/dL — ABNORMAL LOW (ref 70–99)
Glucose-Capillary: 78 mg/dL (ref 70–99)
Glucose-Capillary: 57 mg/dL — ABNORMAL LOW (ref 70–99)
Glucose-Capillary: 67 mg/dL — ABNORMAL LOW (ref 70–99)
Glucose-Capillary: 72 mg/dL (ref 70–99)
Glucose-Capillary: 119 mg/dL — ABNORMAL HIGH (ref 70–99)

## 2023-02-27 SURGERY — AMPUTATION, TOE
Anesthesia: General | Site: Toe | Laterality: Left

## 2023-02-27 MED ORDER — DROPERIDOL 2.5 MG/ML IJ SOLN: 0.6250 mg | Freq: Once | INTRAMUSCULAR | Status: DC | PRN

## 2023-02-27 MED ORDER — DEXAMETHASONE SODIUM PHOSPHATE 10 MG/ML IJ SOLN
INTRAMUSCULAR | Status: AC
  Filled 2023-02-27: qty 1

## 2023-02-27 MED ORDER — PROPOFOL 10 MG/ML IV BOLUS
INTRAVENOUS | Status: AC
  Filled 2023-02-27: qty 20

## 2023-02-27 MED ORDER — INSULIN ASPART 100 UNIT/ML IJ SOLN: 0.0000 [IU] | INTRAMUSCULAR | Status: DC | PRN

## 2023-02-27 MED ORDER — LACTATED RINGERS IV SOLN: INTRAVENOUS | Status: DC | PRN

## 2023-02-27 MED ORDER — ONDANSETRON HCL 4 MG/2ML IJ SOLN
INTRAMUSCULAR | Status: AC
  Filled 2023-02-27: qty 2

## 2023-02-27 MED ORDER — PIPERACILLIN-TAZOBACTAM 3.375 G IVPB
INTRAVENOUS | Status: AC
  Filled 2023-02-27: qty 50

## 2023-02-27 MED ORDER — MIDAZOLAM HCL 2 MG/2ML IJ SOLN
INTRAMUSCULAR | Status: AC
  Filled 2023-02-27: qty 2

## 2023-02-27 MED ORDER — DEXTROSE 50 % IV SOLN: 25.0000 mL | Freq: Once | INTRAVENOUS | Status: AC

## 2023-02-27 MED ORDER — LIDOCAINE HCL (PF) 2 % IJ SOLN
INTRAMUSCULAR | Status: AC
  Filled 2023-02-27: qty 5

## 2023-02-27 MED ORDER — DEXAMETHASONE SODIUM PHOSPHATE 10 MG/ML IJ SOLN
INTRAMUSCULAR | Status: DC | PRN
  Administered 2023-02-27: 5 mg via INTRAVENOUS

## 2023-02-27 MED ORDER — PHENYLEPHRINE HCL (PRESSORS) 10 MG/ML IV SOLN
INTRAVENOUS | Status: DC | PRN
  Administered 2023-02-27: 80 ug via INTRAVENOUS

## 2023-02-27 MED ORDER — FENTANYL CITRATE PF 50 MCG/ML IJ SOSY: 25.0000 ug | PREFILLED_SYRINGE | INTRAMUSCULAR | Status: DC | PRN

## 2023-02-27 MED ORDER — CHLORHEXIDINE GLUCONATE CLOTH 2 % EX PADS
6.0000 | MEDICATED_PAD | Freq: Once | CUTANEOUS | Status: DC
Start: 2023-02-27 — End: 2023-02-27

## 2023-02-27 MED ORDER — MIDAZOLAM HCL 5 MG/5ML IJ SOLN
INTRAMUSCULAR | Status: DC | PRN
Start: 2023-02-27 — End: 2023-02-27
  Administered 2023-02-27: 2 mg via INTRAVENOUS

## 2023-02-27 MED ORDER — FENTANYL CITRATE (PF) 100 MCG/2ML IJ SOLN
INTRAMUSCULAR | Status: AC
  Filled 2023-02-27: qty 2

## 2023-02-27 MED ORDER — ENOXAPARIN SODIUM 80 MG/0.8ML IJ SOSY
70.0000 mg | PREFILLED_SYRINGE | INTRAMUSCULAR | Status: DC
  Administered 2023-02-27 – 2023-03-01 (×3): 70 mg via SUBCUTANEOUS
  Filled 2023-02-27 (×3): qty 0.8

## 2023-02-27 MED ORDER — LIDOCAINE HCL 2 % IJ SOLN
INTRAMUSCULAR | Status: AC
  Filled 2023-02-27: qty 20

## 2023-02-27 MED ORDER — OXYCODONE HCL 5 MG/5ML PO SOLN: 5.0000 mg | Freq: Once | ORAL | Status: DC | PRN

## 2023-02-27 MED ORDER — BUPIVACAINE HCL (PF) 0.5 % IJ SOLN
INTRAMUSCULAR | Status: AC
  Filled 2023-02-27: qty 30

## 2023-02-27 MED ORDER — LIDOCAINE HCL (CARDIAC) PF 100 MG/5ML IV SOSY
PREFILLED_SYRINGE | INTRAVENOUS | Status: DC | PRN
  Administered 2023-02-27: 100 mg via INTRATRACHEAL

## 2023-02-27 MED ORDER — PHENYLEPHRINE 80 MCG/ML (10ML) SYRINGE FOR IV PUSH (FOR BLOOD PRESSURE SUPPORT)
PREFILLED_SYRINGE | INTRAVENOUS | Status: AC
  Filled 2023-02-27: qty 10

## 2023-02-27 MED ORDER — SUCCINYLCHOLINE CHLORIDE 200 MG/10ML IV SOSY
PREFILLED_SYRINGE | INTRAVENOUS | Status: AC
  Filled 2023-02-27: qty 10

## 2023-02-27 MED ORDER — CHLORHEXIDINE GLUCONATE CLOTH 2 % EX PADS: 6.0000 | MEDICATED_PAD | Freq: Once | CUTANEOUS | Status: DC

## 2023-02-27 MED ORDER — ROCURONIUM BROMIDE 100 MG/10ML IV SOLN
INTRAVENOUS | Status: DC | PRN
  Administered 2023-02-27: 10 mg via INTRAVENOUS

## 2023-02-27 MED ORDER — PROPOFOL 10 MG/ML IV BOLUS
INTRAVENOUS | Status: DC | PRN
  Administered 2023-02-27: 200 mg via INTRAVENOUS

## 2023-02-27 MED ORDER — DEXTROSE IN LACTATED RINGERS 5 % IV SOLN: INTRAVENOUS | Status: DC

## 2023-02-27 MED ORDER — 0.9 % SODIUM CHLORIDE (POUR BTL) OPTIME
TOPICAL | Status: DC | PRN
  Administered 2023-02-27: 1000 mL

## 2023-02-27 MED ORDER — DEXTROSE 50 % IV SOLN
25.0000 g | INTRAVENOUS | Status: AC
  Administered 2023-02-27: 12.5 g via INTRAVENOUS
  Filled 2023-02-27: qty 50

## 2023-02-27 MED ORDER — SUCCINYLCHOLINE CHLORIDE 200 MG/10ML IV SOSY
PREFILLED_SYRINGE | INTRAVENOUS | Status: DC | PRN
  Administered 2023-02-27: 160 mg via INTRAVENOUS

## 2023-02-27 MED ORDER — METRONIDAZOLE 500 MG/100ML IV SOLN
500.0000 mg | Freq: Two times a day (BID) | INTRAVENOUS | Status: DC
  Administered 2023-02-27 – 2023-03-02 (×6): 500 mg via INTRAVENOUS
  Filled 2023-02-27 (×6): qty 100

## 2023-02-27 MED ORDER — CEFTRIAXONE SODIUM 2 G IJ SOLR
2.0000 g | INTRAMUSCULAR | Status: DC
  Administered 2023-02-27 – 2023-03-01 (×3): 2 g via INTRAVENOUS
  Filled 2023-02-27 (×3): qty 20

## 2023-02-27 MED ORDER — FENTANYL CITRATE (PF) 100 MCG/2ML IJ SOLN
INTRAMUSCULAR | Status: DC | PRN
  Administered 2023-02-27: 100 ug via INTRAVENOUS

## 2023-02-27 MED ORDER — OXYCODONE HCL 5 MG PO TABS: 5.0000 mg | ORAL_TABLET | Freq: Once | ORAL | Status: DC | PRN

## 2023-02-27 MED ORDER — LIDOCAINE HCL 2 % IJ SOLN
INTRAMUSCULAR | Status: DC | PRN
  Administered 2023-02-27: 10 mL

## 2023-02-27 SURGICAL SUPPLY — 45 items
BAG DECANTER FOR FLEXI CONT (MISCELLANEOUS) IMPLANT
BLADE OSC/SAG .038X5.5 CUT EDG (BLADE) IMPLANT
BLADE OSC/SAGITTAL MD 5.5X18 (BLADE) IMPLANT
BLADE SURG 15 STRL LF DISP TIS (BLADE) ×2 IMPLANT
BLADE SW THK.38XMED LNG THN (BLADE) IMPLANT
BNDG COHESIVE 2X5 TAN ST LF (GAUZE/BANDAGES/DRESSINGS) ×2 IMPLANT
BNDG ELASTIC 6INX 5YD STR LF (GAUZE/BANDAGES/DRESSINGS) IMPLANT
BNDG ELASTIC 6X10 VLCR STRL LF (GAUZE/BANDAGES/DRESSINGS) IMPLANT
BNDG ESMARK 4X9 LF (GAUZE/BANDAGES/DRESSINGS) IMPLANT
BNDG GAUZE DERMACEA FLUFF 4 (GAUZE/BANDAGES/DRESSINGS) IMPLANT
COVER BACK TABLE 60X90IN (DRAPES) ×2 IMPLANT
CUFF TOURN SGL QUICK 18X4 (TOURNIQUET CUFF) ×2 IMPLANT
DRAPE EXTREMITY T 121X128X90 (DISPOSABLE) ×2 IMPLANT
DRAPE SHEET LG 3/4 BI-LAMINATE (DRAPES) ×2 IMPLANT
DRAPE SURG 17X23 STRL (DRAPES) ×2 IMPLANT
DRSG EMULSION OIL 3X16 NADH (GAUZE/BANDAGES/DRESSINGS) IMPLANT
DURAPREP 26ML APPLICATOR (WOUND CARE) IMPLANT
ELECT REM PT RETURN 15FT ADLT (MISCELLANEOUS) ×2 IMPLANT
GAUZE 4X4 16PLY ~~LOC~~+RFID DBL (SPONGE) ×2 IMPLANT
GAUZE SPONGE 4X4 12PLY STRL (GAUZE/BANDAGES/DRESSINGS) ×2 IMPLANT
GAUZE STRETCH 2X75IN STRL (MISCELLANEOUS) ×2 IMPLANT
GAUZE XEROFORM 1X8 LF (GAUZE/BANDAGES/DRESSINGS) ×2 IMPLANT
GLOVE BIO SURGEON STRL SZ7.5 (GLOVE) ×2 IMPLANT
GLOVE BIOGEL PI IND STRL 7.5 (GLOVE) ×2 IMPLANT
GOWN STRL REUS W/ TWL LRG LVL3 (GOWN DISPOSABLE) ×2 IMPLANT
KIT BASIN OR (CUSTOM PROCEDURE TRAY) ×2 IMPLANT
KIT TURNOVER KIT A (KITS) ×2 IMPLANT
NDL HYPO 22X1.5 SAFETY MO (MISCELLANEOUS) ×2 IMPLANT
NEEDLE HYPO 22X1.5 SAFETY MO (MISCELLANEOUS) ×1 IMPLANT
NS IRRIG 1000ML POUR BTL (IV SOLUTION) ×2 IMPLANT
PADDING CAST ABS COTTON 4X4 ST (CAST SUPPLIES) ×2 IMPLANT
PENCIL SMOKE EVACUATOR (MISCELLANEOUS) ×2 IMPLANT
SPIKE FLUID TRANSFER (MISCELLANEOUS) IMPLANT
SPONGE T-LAP 4X18 ~~LOC~~+RFID (SPONGE) IMPLANT
STOCKINETTE 6 STRL (DRAPES) ×2 IMPLANT
STRIP CLOSURE SKIN 1/4X4 (GAUZE/BANDAGES/DRESSINGS) IMPLANT
SUCTION TUBE FRAZIER 10FR DISP (SUCTIONS) IMPLANT
SUT ETHILON 3 0 PS 1 (SUTURE) ×2 IMPLANT
SUT ETHILON 4 0 PS 2 18 (SUTURE) IMPLANT
SUT ETHILON 5 0 PS 2 18 (SUTURE) IMPLANT
SYR BULB EAR ULCER 3OZ GRN STR (SYRINGE) ×2 IMPLANT
SYR CONTROL 10ML LL (SYRINGE) ×2 IMPLANT
TUBING CONNECTING 10 (TUBING) IMPLANT
UNDERPAD 30X36 HEAVY ABSORB (UNDERPADS AND DIAPERS) ×2 IMPLANT
WATER STERILE IRR 500ML POUR (IV SOLUTION) ×2 IMPLANT

## 2023-02-27 NOTE — Brief Op Note (Signed)
 02/25/2023 - 02/27/2023  4:47 PM  PATIENT:  [name]  53 y.o. male  PRE-OPERATIVE DIAGNOSIS:  Left hallux osteomyelitis  POST-OPERATIVE DIAGNOSIS:  * No post-op diagnosis entered *  PROCEDURE:  Procedure(s): AMPUTATION TOE (Left)  SURGEON:  Surgeons and Role:    DEWAINE Joya Stabs, DPM - Primary  PHYSICIAN ASSISTANT:   ASSISTANTS: none   ANESTHESIA:   local and MAC  EBL:  10 cc   BLOOD ADMINISTERED:none  DRAINS: none   LOCAL MEDICATIONS USED:  MARCAINE    , LIDOCAINE  , and Amount: 10 ml  SPECIMEN:  Source of Specimen:  Left hallux for pathology, culture of left wound, culture of residual left toe bone  DISPOSITION OF SPECIMEN:   one specimen for pathology and 2 for culture   COUNTS:  YES  TOURNIQUET:  * Missing tourniquet times found for documented tourniquets in log: [id number] *  DICTATION: .Note written in EPIC  PLAN OF CARE: Admit to inpatient   PATIENT DISPOSITION:  PACU - hemodynamically stable.   Delay start of Pharmacological VTE agent (>24hrs) due to surgical blood loss or risk of bleeding: no

## 2023-02-27 NOTE — Anesthesia Procedure Notes (Signed)
 Procedure Name: Intubation Date/Time: 02/27/2023 5:20 PM  Performed by: Paul Lamarr BRAVO, MDPre-anesthesia Checklist: Patient identified, Emergency Drugs available, Suction available and Patient being monitored Patient Re-evaluated:Patient Re-evaluated prior to induction Oxygen  Delivery Method: Circle System Utilized Preoxygenation: Pre-oxygenation with 100% oxygen  Induction Type: IV induction and Rapid sequence Laryngoscope Size: Miller and 2 Grade View: Grade II Tube type: Oral Tube size: 7.5 mm Number of attempts: 2 Airway Equipment and Method: Stylet Placement Confirmation: ETT inserted through vocal cords under direct vision, positive ETCO2 and breath sounds checked- equal and bilateral Secured at: 22 cm Tube secured with: Tape Dental Injury: Teeth and Oropharynx as per pre-operative assessment  Difficulty Due To: Difficult Airway- due to anterior larynx Future Recommendations: Recommend- induction with short-acting agent, and alternative techniques readily available Comments: RSI due to recent GLP-1 agonist. First attempt by CRNA, unable to view glottis with Miller 2 blade. Second attempt by myself with Cleotilde 2 blade, grade 2B view. Atraumatic intubation. Would use longer blade for future attempts. Lawence, MD

## 2023-02-27 NOTE — Progress Notes (Signed)
 Orthopedic Tech Progress Note Patient Details:  [name] 01/20/1971 [id number]  Ortho Devices Type of Ortho Device: Postop shoe/boot Ortho Device/Splint Location: LLE Ortho Device/Splint Interventions: Application   Post Interventions Patient Tolerated: Well  [name] [name] 02/27/2023, 8:25 PM

## 2023-02-27 NOTE — Anesthesia Preprocedure Evaluation (Addendum)
 Anesthesia Evaluation  Patient identified by MRN, date of birth, ID band Patient awake    Reviewed: Allergy & Precautions, NPO status , Patient's Chart, lab work & pertinent test results  History of Anesthesia Complications Negative for: history of anesthetic complications  Airway Mallampati: II  TM Distance: >3 FB Neck ROM: Full    Dental no notable dental hx.    Pulmonary neg pulmonary ROS   Pulmonary exam normal        Cardiovascular hypertension, Pt. 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  Reproductive/Obstetrics                             Anesthesia Physical Anesthesia Plan  ASA: 3  Anesthesia Plan: General   Post-op Pain Management:    Induction: Rapid sequence and Intravenous  PONV Risk Score and Plan: 2 and Treatment may vary due to age or medical condition, Midazolam , Ondansetron  and Dexamethasone   Airway Management Planned: Oral ETT  Additional Equipment: None  Intra-op Plan:   Post-operative Plan: Extubation in OR  Informed Consent: I have reviewed the patients History and Physical, chart, labs and discussed the procedure including the risks, benefits and alternatives for the proposed anesthesia with the patient or authorized representative who has indicated his/her understanding and acceptance.     Dental advisory given  Plan  Discussed with: CRNA  Anesthesia Plan Comments:        Anesthesia Quick Evaluation

## 2023-02-27 NOTE — Progress Notes (Signed)
 PHARMACY - PHYSICIAN COMMUNICATION CRITICAL VALUE ALERT - BLOOD CULTURE IDENTIFICATION (BCID)  [name] is an 53 y.o. male who presented to [facility] on 02/25/2023 with a chief complaint of left great toe swelling and blister that is started 3 weeks prior to admission.   Assessment:  Blood cultures growing Staph Capitis x 1 and Proteus x 1 thus far.  Name of physician (or Provider) Contacted: Rizwan  Current antibiotics: Zyvox , Zosyn   Changes to prescribed antibiotics recommended:  Con't Zyvox  until after amputation Change Zosyn  to Rocephin /Flagyl  for anaerobic coverage.  Results for orders placed or performed during the hospital encounter of 02/25/23  Blood Culture ID Panel (Reflexed) (Collected: 02/25/2023 11:43 AM)  Result Value Ref Range   Enterococcus faecalis NOT DETECTED NOT DETECTED   Enterococcus Faecium NOT DETECTED NOT DETECTED   Listeria monocytogenes NOT DETECTED NOT DETECTED   Staphylococcus species NOT DETECTED NOT DETECTED   Staphylococcus aureus (BCID) NOT DETECTED NOT DETECTED   Staphylococcus epidermidis NOT DETECTED NOT DETECTED   Staphylococcus lugdunensis NOT DETECTED NOT DETECTED   Streptococcus species NOT DETECTED NOT DETECTED   Streptococcus agalactiae NOT DETECTED NOT DETECTED   Streptococcus pneumoniae NOT DETECTED NOT DETECTED   Streptococcus pyogenes NOT DETECTED NOT DETECTED   A.calcoaceticus-baumannii NOT DETECTED NOT DETECTED   Bacteroides fragilis NOT DETECTED NOT DETECTED   Enterobacterales DETECTED (A) NOT DETECTED   Enterobacter cloacae complex NOT DETECTED NOT DETECTED   Escherichia coli NOT DETECTED NOT DETECTED   Klebsiella aerogenes NOT DETECTED NOT DETECTED   Klebsiella oxytoca NOT DETECTED NOT DETECTED   Klebsiella pneumoniae NOT DETECTED NOT DETECTED   Proteus species DETECTED (A) NOT DETECTED   Salmonella species NOT DETECTED NOT DETECTED   Serratia marcescens NOT DETECTED NOT DETECTED   Haemophilus influenzae NOT DETECTED  NOT DETECTED   Neisseria meningitidis NOT DETECTED NOT DETECTED   Pseudomonas aeruginosa NOT DETECTED NOT DETECTED   Stenotrophomonas maltophilia NOT DETECTED NOT DETECTED   Candida albicans NOT DETECTED NOT DETECTED   Candida auris NOT DETECTED NOT DETECTED   Candida glabrata NOT DETECTED NOT DETECTED   Candida krusei NOT DETECTED NOT DETECTED   Candida parapsilosis NOT DETECTED NOT DETECTED   Candida tropicalis NOT DETECTED NOT DETECTED   Cryptococcus neoformans/gattii NOT DETECTED NOT DETECTED   CTX-M ESBL NOT DETECTED NOT DETECTED   Carbapenem resistance IMP NOT DETECTED NOT DETECTED   Carbapenem resistance KPC NOT DETECTED NOT DETECTED   Carbapenem resistance NDM NOT DETECTED NOT DETECTED   Carbapenem resist OXA 48 LIKE NOT DETECTED NOT DETECTED   Carbapenem resistance VIM NOT DETECTED NOT DETECTED   Jamarquis Crull S. Casimir, PharmD, BCPS Clinical Staff Pharmacist Casimir Salines Stillinger 02/27/2023  10:45 AM

## 2023-02-27 NOTE — Transfer of Care (Signed)
 Immediate Anesthesia Transfer of Care Note  Patient: Margaret Staggs Sondgeroth  Procedure(s) Performed: AMPUTATION TOE (Left: Toe)  Patient Location: PACU  Anesthesia Type:General  Level of Consciousness: awake, alert , and oriented  Airway & Oxygen  Therapy: Patient Spontanous Breathing and Patient connected to nasal cannula oxygen   Post-op Assessment: Report given to RN and Post -op Vital signs reviewed and stable  Post vital signs: Reviewed and stable  Last Vitals:  Vitals Value Taken Time  BP 144/85 02/27/23 1748  Temp    Pulse 74 02/27/23 1753  Resp 23 02/27/23 1753  SpO2 99 % 02/27/23 1753  Vitals shown include unfiled device data.  Last Pain:  Vitals:   02/27/23 1554  TempSrc: Oral  PainSc: 0-No pain         Complications: No notable events documented.

## 2023-02-27 NOTE — TOC CM/SW Note (Signed)
 Transition of Care [facility]) - Inpatient Brief Assessment   Patient Details  Name: [name] MRN: [id number] Date of Birth: [birth year]  [name]) CM/SW Contact:    Tawni CHRISTELLA Eva, LCSW Phone Number: 02/27/2023, 10:26 AM   Clinical Narrative:  Noted pt has PCP and no SDOH concerns.   Transition of Care Asessment: Insurance and Status: Insurance coverage has been reviewed Patient has primary care physician: Yes Home environment has been reviewed: home with spouse Prior level of function:: indepenent Prior/Current Home Services: No current home services Social Drivers of Health Review: SDOH reviewed no interventions necessary Readmission risk has been reviewed: Yes Transition of care needs: no transition of care needs at this time

## 2023-02-27 NOTE — Op Note (Signed)
 OPERATIVE REPORT Patient name: [name] MRN: [id number] DOB: 1970-06-20  DOS:  02/27/23  Preop Dx: Osteomyelitis of great toe of left foot (HCC)  Sepsis, due to unspecified organism, unspecified whether acute organ dysfunction present (HCC) Postop Dx: same  Procedure:  1. Left partial hallux amputation  Surgeon: [name], DPM  Anesthesia: 50-50 mixture of 2% lidocaine  plain with 0.5% Marcaine  plain totaling 10cc infiltrated in the patient's left lower extremity  Hemostasis: No TQ necessary   EBL: 10 mL Materials: n/a Injectables: as above Pathology: Left hallux for pathology, left toe wound for culture, left residual bone for culture   Condition: The patient tolerated the procedure and anesthesia well. No complications noted or reported   Justification for procedure: The patient is a 53 y.o. male who presents today for surgical treatment of left great toe osteomyelitis.  All conservative modalities of been unsuccessful in providing any sort of satisfactory alleviation of symptoms with the patient. The patient was told benefits as well as possible side effects of the surgery. The patient consented for surgical correction. The patient consent form was reviewed. All patient questions were answered. No guarantees were expressed or implied. The patient and the surgeon both signed the patient consent form with the witness present and placed in the patient's chart.   Procedure in Detail: The patient was brought to the operating room, placed in the operating table in the supine position at which time an aseptic scrub and drape were performed about the patient's respective lower extremity after anesthesia was induced as described above. Attention was then directed to the surgical area where procedure number one commenced.  Procedure #1:  Attention was directed to the left hallux were an incision was preformed encircling the toe at the hallux IPJ.. Incision was made down  to bone and digit was amputated at the level of the interphalangeal joint . After the toe was disarticulated it was passed off to the back table to be sent to pathology for further evaluation. A swab of the wound was taken for culture. The extensor and flexor tendons were identified and resected as far proximally as possible. Any necrotic tissue was removed to healthy bleeding tissue. The proximal phalangeal head was identified and noted to be hard. The area was irrigated copiously sterile saline and residual bone was sent to microbiology. Any bleeders noted were cauterized as necessary. The skin was re-approximated utilizing 3-0 nylon suture.    Dry sterile compressive dressings were then applied to all previously mentioned incision sites about the patient's lower extremity. The patient was then transferred from the operating room to the recovery room having tolerated the procedure and anesthesia well. All vital signs are stable. After a brief stay in the recovery room the patient was readmitted to the floor.    Disposition:  Patient will keep dressing clean dry and intact. He may be heel weight bearing in surgical shoe. Plan for two weeks broad spectrum antibiotics upon discharge. Will follow-up in clinic in about a week. Already has appointment with Dr. Tobie scheduled.    [name], DPM [facility]  Dr. [name], DPM    [address] [place], [place] [address]  Office [phone]  Fax [phone]

## 2023-02-27 NOTE — Progress Notes (Signed)
 PROGRESS NOTE    [name]  FMW:[id number] DOB: [birth year] DOA: 02/25/2023 PCP: Katrinka Garnette KIDD, MD   Brief Narrative: 53 year old with past medical history significant for CKD stage II, diabetes type 2, hypertension, morbid obesity, chronic mild elevation of liver function test, pituitary adenoma, history of ray amputation of the right foot presents with left great toe swelling and blister that is started 3 weeks prior to admission.  Patient was noted to be febrile, tachycardic tachypneic.  Admitted with sepsis secondary to left great toe osteomyelitis.   Assessment & Plan:   Principal Problem:   Diabetic infection of left foot (HCC) Active Problems:   Acute renal failure superimposed on stage 3a chronic kidney disease (HCC)   Osteomyelitis of great toe of left foot (HCC)   1-Sepsis secondary to Acute osteomyelitis of the left great toe, POA Proteus Bacteremia.  Blood culture positive for Staph Capitis  -Continue with IV fluids.  -Continue with IV antibiotics: Linezolid , Ceftriaxone  and flagyl .  -MRI left Foot: Plantar soft tissue ulceration at the tip of the great toe with sinus tract extending along the inferior aspect of the first distal phalanx. Associated osteomyelitis of the first distal phalanx and probable first IP joint septic arthritis. Complete tear of the flexor hallucis longus tendon at the base of the first distal phalanx. Small 1 cm abscess overlying the distal extensor hallucis longus tendon. -Appreciate Podiatry consultation , plan for sx today,.  - Will repeat Blood cultures tomorrow.   AKI on CKD stage II: Previous creatinine baseline 1.4 Cr down to 1.6--1.7--1.5 Continue with IV fluids.   Diabetes type 2, hypoglycemia.  Continue to hold Tresiba .  SSI Added D 5 to fluids due to NPO status, prevent hypoglycemia.   Chronic transaminates Hold statins.   Hyperlipidemia Holding statins due to transaminases.   Hypertension Holding  ramipril /HCTZ due to AKI PR hydralazine .   Morbid obesity Needs life style modification.   Estimated body mass index is 41.11 kg/m as calculated from the following:   Height as of 01/30/23: 6' (1.829 m).   Weight as of this encounter: 137.5 kg.   DVT prophylaxis: Lovenox  Code Status: Full code Family Communication: care discussed with patient Disposition Plan:  Status is: Inpatient Remains inpatient appropriate because: management of Osteomyelitis.     Consultants:  Podiatry  Procedures:  none Antimicrobials:    Subjective: He is doing ok, no new complaints.    Objective: Vitals:   02/26/23 0951 02/26/23 1258 02/26/23 2157 02/27/23 0642  BP: 125/71 117/74 118/71 115/75  Pulse: 85 80 81 72  Resp: 17 17 18 18   Temp: 99.2 F (37.3 C) 98.8 F (37.1 C) 98.9 F (37.2 C) 98.2 F (36.8 C)  TempSrc: Oral Oral Oral Oral  SpO2: 99% 100% 100% 100%  Weight:    (!) 137.5 kg    Intake/Output Summary (Last 24 hours) at 02/27/2023 0705 Last data filed at 02/27/2023 9471 Gross per 24 hour  Intake 3207.77 ml  Output 850 ml  Net 2357.77 ml   Filed Weights   02/26/23 0548 02/27/23 0642  Weight: (!) 139.7 kg (!) 137.5 kg    Examination:  General exam: NAD Respiratory system: CTA Cardiovascular system: S1, S 2 RRR Gastrointestinal system: BS present, soft, nt Central nervous system: Non focal.  Extremities: left foot with dressing  Data Reviewed: I have personally reviewed following labs and imaging studies  CBC: Recent Labs  Lab 02/25/23 1159 02/25/23 1834 02/26/23 0516 02/27/23 0446  WBC 12.7* 13.2* 8.9 7.1  NEUTROABS 11.3*  --   --   --   HGB 12.5* 12.6* 11.2* 11.0*  HCT 40.7 41.1 35.8* 34.7*  MCV 83.9 86.0 82.3 82.2  PLT 368 306 313 293   Basic Metabolic Panel: Recent Labs  Lab 02/25/23 1159 02/26/23 0516 02/27/23 0446  NA 138 135 136  K 3.6 3.5 3.7  CL 103 100 103  CO2 22 23 26   GLUCOSE 124* 84 94  BUN 23* 20 16  CREATININE 1.78* 1.61* 1.54*   CALCIUM  9.3 8.9 8.8*   GFR: Estimated Creatinine Clearance: 80.6 mL/min (A) (by C-G formula based on SCr of 1.54 mg/dL (H)). Liver Function Tests: Recent Labs  Lab 02/25/23 1159 02/26/23 0516  AST 77* 74*  ALT 91* 98*  ALKPHOS 114 99  BILITOT 1.4* 1.4*  PROT 8.6* 7.3  ALBUMIN 3.6 3.0*   No results for input(s): LIPASE, AMYLASE in the last 168 hours. No results for input(s): AMMONIA in the last 168 hours. Coagulation Profile: Recent Labs  Lab 02/25/23 1159  INR 1.0   Cardiac Enzymes: No results for input(s): CKTOTAL, CKMB, CKMBINDEX, TROPONINI in the last 168 hours. BNP (last 3 results) No results for input(s): PROBNP in the last 8760 hours. HbA1C: No results for input(s): HGBA1C in the last 72 hours. CBG: Recent Labs  Lab 02/26/23 0827 02/26/23 1141 02/26/23 1654 02/26/23 1734 02/26/23 2158  GLUCAP 85 113* 65* 109* 132*   Lipid Profile: No results for input(s): CHOL, HDL, LDLCALC, TRIG, CHOLHDL, LDLDIRECT in the last 72 hours. Thyroid Function Tests: No results for input(s): TSH, T4TOTAL, FREET4, T3FREE, THYROIDAB in the last 72 hours. Anemia Panel: No results for input(s): VITAMINB12, FOLATE, FERRITIN, TIBC, IRON, RETICCTPCT in the last 72 hours. Sepsis Labs: Recent Labs  Lab 02/25/23 1211 02/25/23 1500  LATICACIDVEN 1.6 0.8    Recent Results (from the past 240 hours)  Culture, blood (Routine x 2)     Status: None (Preliminary result)   Collection Time: 02/25/23 12:00 PM   Specimen: BLOOD  Result Value Ref Range Status   Specimen Description   Final    BLOOD SITE NOT SPECIFIED Performed at [facility], [address], [place], [place] [address]    Special Requests   Final    BOTTLES DRAWN AEROBIC AND ANAEROBIC Blood Culture adequate volume Performed at [facility], [address] [place], [place] [address]    Culture   Final    NO GROWTH 2 DAYS Performed at [facility], [address], [place], [place] [address]    Report Status PENDING  Incomplete  Culture, blood (Routine x 2)     Status: None (Preliminary result)   Collection Time: 02/25/23 12:00 PM   Specimen: BLOOD  Result Value Ref Range Status   Specimen Description   Final    BLOOD SITE NOT SPECIFIED Performed at [facility], [address] [place], [place] [address]    Special Requests   Final    BOTTLES DRAWN AEROBIC AND ANAEROBIC Blood Culture adequate volume Performed at [facility], [address], [place], [place] [address]    Culture  Setup Time   Final    GRAM POSITIVE COCCI IN CLUSTERS ANAEROBIC BOTTLE ONLY CRITICAL RESULT CALLED TO, READ BACK BY AND VERIFIED WITH: PHARMD MARY SWAYNE ON 02/26/23 @ 1559 BY DRT Performed at [facility], [address], [place], [place] [address]    Culture GRAM POSITIVE COCCI IN CLUSTERS  Final   Report Status PENDING  Incomplete  Blood Culture ID Panel (Reflexed)     Status: Abnormal   Collection Time: 02/25/23 12:00 PM  Result Value Ref Range Status   Enterococcus faecalis NOT DETECTED NOT DETECTED Final   Enterococcus Faecium NOT DETECTED NOT DETECTED Final   Listeria monocytogenes NOT DETECTED NOT DETECTED Final   Staphylococcus species DETECTED (A) NOT DETECTED Final    Comment: CRITICAL RESULT CALLED TO, READ BACK BY AND VERIFIED WITH: PHARMD MARY SWAYNE ON 02/26/23 @ 1559 BY DRT    Staphylococcus aureus (BCID) NOT DETECTED NOT DETECTED Final   Staphylococcus epidermidis NOT DETECTED NOT DETECTED Final   Staphylococcus lugdunensis NOT DETECTED NOT DETECTED Final   Streptococcus species NOT DETECTED NOT DETECTED Final   Streptococcus agalactiae NOT DETECTED NOT DETECTED Final   Streptococcus pneumoniae NOT DETECTED NOT DETECTED Final   Streptococcus pyogenes NOT DETECTED NOT DETECTED Final   A.calcoaceticus-baumannii NOT DETECTED NOT DETECTED Final   Bacteroides fragilis NOT DETECTED NOT  DETECTED Final   Enterobacterales NOT DETECTED NOT DETECTED Final   Enterobacter cloacae complex NOT DETECTED NOT DETECTED Final   Escherichia coli NOT DETECTED NOT DETECTED Final   Klebsiella aerogenes NOT DETECTED NOT DETECTED Final   Klebsiella oxytoca NOT DETECTED NOT DETECTED Final   Klebsiella pneumoniae NOT DETECTED NOT DETECTED Final   Proteus species NOT DETECTED NOT DETECTED Final   Salmonella species NOT DETECTED NOT DETECTED Final   Serratia marcescens NOT DETECTED NOT DETECTED Final   Haemophilus influenzae NOT DETECTED NOT DETECTED Final   Neisseria meningitidis NOT DETECTED NOT DETECTED Final   Pseudomonas aeruginosa NOT DETECTED NOT DETECTED Final   Stenotrophomonas maltophilia NOT DETECTED NOT DETECTED Final   Candida albicans NOT DETECTED NOT DETECTED Final   Candida auris NOT DETECTED NOT DETECTED Final   Candida glabrata NOT DETECTED NOT DETECTED Final   Candida krusei NOT DETECTED NOT DETECTED Final   Candida parapsilosis NOT DETECTED NOT DETECTED Final   Candida tropicalis NOT DETECTED NOT DETECTED Final   Cryptococcus neoformans/gattii NOT DETECTED NOT DETECTED Final    Comment: Performed at [facility], [address] [place], [place] [address]  Surgical pcr screen     Status: None   Collection Time: 02/26/23  7:37 PM   Specimen: Nasal Mucosa; Nasal Swab  Result Value Ref Range Status   MRSA, PCR NEGATIVE NEGATIVE Final   Staphylococcus aureus NEGATIVE NEGATIVE Final    Comment: (NOTE) The Xpert SA Assay (FDA approved for NASAL specimens in patients 52 years of age and older), is one component of a comprehensive surveillance program. It is not intended to diagnose infection nor to guide or monitor treatment. Performed at [facility], [address] [place], [place] [address]          Radiology Studies: MR FOOT LEFT WO CONTRAST Result Date: 02/26/2023 CLINICAL DATA:  Worsening great toe wound. EXAM: MRI OF THE LEFT FOOT  WITHOUT CONTRAST TECHNIQUE: Multiplanar, multisequence MR imaging of the left forefoot was performed. No intravenous contrast was administered. COMPARISON:  Left foot x-rays from same day. FINDINGS: Bones/Joint/Cartilage Abnormal marrow edema with corresponding decreased T1 marrow signal involving the first distal phalanx, consistent with osteomyelitis. Prior partial amputation of the second toe involving the middle and distal phalanges. Prior resection of the distal fifth metatarsal. No fracture or dislocation. Mild moderate degenerative changes of the first MTP joint. Small first IP joint effusion. Ligaments Collateral ligaments are intact. Muscles and Tendons The flexor hallucis  longus tendon is completely torn at the base of the first distal phalanx. Minimal retraction. Remaining flexor and extensor are intact. Small 1 cm fluid collection overlying the distal extensor hallucis longus tendon. Increased T2 signal within and atrophy of the intrinsic muscles of the forefoot, nonspecific, but likely related to diabetic muscle changes. Soft tissue Plantar soft tissue ulceration at the tip of the great toe with sinus tract extending along the inferior aspect of the first distal phalanx (series 8, image 11). Forefoot and great toe soft tissue swelling. No soft tissue mass. IMPRESSION: 1. Plantar soft tissue ulceration at the tip of the great toe with sinus tract extending along the inferior aspect of the first distal phalanx. Associated osteomyelitis of the first distal phalanx and probable first IP joint septic arthritis. 2. Complete tear of the flexor hallucis longus tendon at the base of the first distal phalanx. 3. Small 1 cm abscess overlying the distal extensor hallucis longus tendon. Electronically Signed   By: Elsie ONEIDA Shoulder M.D.   On: 02/26/2023 08:27   DG Chest 2 View Result Date: 02/25/2023 CLINICAL DATA:  Suspected sepsis. EXAM: CHEST - 2 VIEW COMPARISON:  08/13/2021 FINDINGS: Low lung volumes. The heart  size and mediastinal contours are within normal limits. Both lungs are clear. The visualized skeletal structures are unremarkable. IMPRESSION: No active cardiopulmonary disease. Electronically Signed   By: Norman Gatlin M.D.   On: 02/25/2023 14:36   DG Foot Complete Left Result Date: 02/25/2023 CLINICAL DATA:  Great toe infection.  Assess for osteomyelitis. EXAM: LEFT FOOT - COMPLETE 3+ VIEW COMPARISON:  Left foot radiographs 07/22/2019 and 06/21/2019 FINDINGS: Redemonstration of amputation of the second toe middle and distal phalanges and the distal aspect of the fifth metatarsal. There is moderate great toe soft tissue swelling. Irregularity of the distal great toe soft tissues with linear lucency tracking from the distal plantar aspect of the great toe towards the distal aspect of the distal phalanx suspicious for an ulcer and sinus tract containing air. There is new mild cortical erosion at the distal aspect of the distal phalanx with a curvilinear lucency that may represent bone fragmentation. Mild-to-moderate plantar calcaneal heel spur with additional 18 mm length mineralization overlying the proximal plantar aponeurosis on lateral view. Mild chronic enthesopathic change at the Achilles insertion on the calcaneus. IMPRESSION: 1. Moderate great toe soft tissue swelling with irregularity of the distal great toe soft tissues suspicious for an ulcer and sinus tract. 2. New mild cortical erosion at the distal aspect of the great toe distal phalanx with a curvilinear lucency that may represent bone fragmentation. This is suspicious for acute osteomyelitis. 3. Redemonstration of amputation of the second toe middle and distal phalanges and the distal aspect of the fifth metatarsal. Electronically Signed   By: Tanda Lyons M.D.   On: 02/25/2023 13:57        Scheduled Meds:  aspirin  EC  81 mg Oral Daily   enoxaparin  (LOVENOX ) injection  60 mg Subcutaneous Q24H   fenofibrate   54 mg Oral Daily   insulin   aspart  0-15 Units Subcutaneous TID WC   insulin  aspart  0-5 Units Subcutaneous QHS   omega-3 acid ethyl esters  1 g Oral Daily   sodium chloride  flush  3 mL Intravenous Q12H   Continuous Infusions:  linezolid  (ZYVOX ) IV 600 mg (02/26/23 2159)   piperacillin -tazobactam (ZOSYN )  IV 3.375 g (02/27/23 0510)     LOS: 2 days    Time spent: 35 Minutes  Owen DELENA Lore, MD [facility]   If 7PM-7AM, please contact night-coverage [email or web address]  02/27/2023, 7:05 AM

## 2023-02-27 NOTE — Interval H&P Note (Signed)
 History and Physical Interval Note:  02/27/2023 4:47 PM  [name]  has presented today for surgery, with the diagnosis of Left hallux osteomyelitis.  The various methods of treatment have been discussed with the patient and family. After consideration of risks, benefits and other options for treatment, the patient has consented to  Procedure(s): AMPUTATION TOE (Left) as a surgical intervention.  The patient's history has been reviewed, patient examined, no change in status, stable for surgery.  I have reviewed the patient's chart and labs.  Questions were answered to the patient's satisfaction.     [name]

## 2023-02-28 DIAGNOSIS — E1169 Type 2 diabetes mellitus with other specified complication: Secondary | ICD-10-CM

## 2023-02-28 DIAGNOSIS — L089 Local infection of the skin and subcutaneous tissue, unspecified: Secondary | ICD-10-CM | POA: Diagnosis not present

## 2023-02-28 DIAGNOSIS — M86172 Other acute osteomyelitis, left ankle and foot: Secondary | ICD-10-CM | POA: Diagnosis not present

## 2023-02-28 DIAGNOSIS — Z794 Long term (current) use of insulin: Secondary | ICD-10-CM

## 2023-02-28 DIAGNOSIS — Z89422 Acquired absence of other left toe(s): Secondary | ICD-10-CM | POA: Diagnosis not present

## 2023-02-28 DIAGNOSIS — E11628 Type 2 diabetes mellitus with other skin complications: Secondary | ICD-10-CM | POA: Diagnosis not present

## 2023-02-28 LAB — GLUCOSE, CAPILLARY
Glucose-Capillary: 141 mg/dL — ABNORMAL HIGH (ref 70–99)
Glucose-Capillary: 112 mg/dL — ABNORMAL HIGH (ref 70–99)
Glucose-Capillary: 156 mg/dL — ABNORMAL HIGH (ref 70–99)
Glucose-Capillary: 146 mg/dL — ABNORMAL HIGH (ref 70–99)

## 2023-02-28 LAB — BASIC METABOLIC PANEL
Anion gap: 7 (ref 5–15)
BUN: 15 mg/dL (ref 6–20)
CO2: 27 mmol/L (ref 22–32)
Calcium: 9.2 mg/dL (ref 8.9–10.3)
Chloride: 101 mmol/L (ref 98–111)
Creatinine, Ser: 1.34 mg/dL — ABNORMAL HIGH (ref 0.61–1.24)
GFR, Estimated: >60 mL/min (ref >60)
Glucose, Bld: 150 mg/dL — ABNORMAL HIGH (ref 70–99)
Potassium: 4.2 mmol/L (ref 3.5–5.1)
Sodium: 135 mmol/L (ref 135–145)

## 2023-02-28 LAB — CULTURE, BLOOD (ROUTINE X 2): Special Requests: ADEQUATE

## 2023-02-28 LAB — CBC
HCT: 38.2 % — ABNORMAL LOW (ref 39.0–52.0)
Hemoglobin: 11.6 g/dL — ABNORMAL LOW (ref 13.0–17.0)
MCH: 24.9 pg — ABNORMAL LOW (ref 26.0–34.0)
MCHC: 30.4 g/dL (ref 30.0–36.0)
MCV: 82.2 fL (ref 80.0–100.0)
Platelets: 332 10*3/uL (ref 150–400)
RBC: 4.65 MIL/uL (ref 4.22–5.81)
RDW: 12.9 % (ref 11.5–15.5)
WBC: 7.4 10*3/uL (ref 4.0–10.5)
nRBC: 0 % (ref 0.0–0.2)

## 2023-02-28 MED ORDER — POLYETHYLENE GLYCOL 3350 17 G PO PACK
17.0000 g | PACK | Freq: Every day | ORAL | Status: DC
  Filled 2023-02-28 (×3): qty 1

## 2023-02-28 NOTE — Progress Notes (Signed)
 Subjective:  Patient ID: [name], male    DOB: [birth year],  MRN: [id number]  Patient was seen at bedside this morning s/p left partial hallux amputation. Relates doing well and no pain currently. Denies n/v/f/c.   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   (06-17-2019 pt stated has been stable with no changes and followed by pcp)   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HCC)    11-06-2016  lower extremity angiography (dr eliza)  mil tibial disease w/ occlusion of the distal peroneal artery and dorsalis pedis artery but has widely patent posterior tibial and anterior tibial arterties   Type 2 diabetes mellitus treated with insulin  (HCC)    followed by pcp   (06-17-2019  pt stated checks blood sugar daily in am,  fasting sugar-- average 71)   Vitamin B 12 deficiency      Past Surgical History:  Procedure Laterality Date   AMPUTATION Right 07/31/2016   Procedure: AMPUTATION RIGHT FIFTH RAY AND APPLICATION OF WOUND VAC;  Surgeon: Yvone Rush, MD;  Location: [facility];  Service: Orthopedics;  Laterality: Right;   AMPUTATION Right 08/08/2016   Procedure: RAY AMPUTATION RIGHT FOURTH METATARSAL, DEBRIDEMENT OF SUBQ TISSUE,BONE AND SKIN WITH WOUND CLOSURE;  Surgeon: Yvone Rush, MD;  Location: [facility];  Service: Orthopedics;  Laterality: Right;   AMPUTATION TOE Left 06/21/2019   Procedure: AMPUTATION TOE SECOND LEFT, DEBRIDEMENT OF LEFT GREAT TOE;   Surgeon: Gretel Ozell PARAS, DPM;  Location: [facility];  Service: Podiatry;  Laterality: Left;   AMPUTATION TOE Right 09/03/2020   Procedure: AMPUTATION TOE;  Surgeon: Gretel Ozell PARAS, DPM;  Location: [facility];  Service: Podiatry;  Laterality: Right;   ANAL FISSURE REPAIR  2000   APPLICATION OF A-CELL OF EXTREMITY Right 08/13/2016   Procedure: APPLICATION OF A-CELL AND VAC;  Surgeon: Lowery Estefana RAMAN, DO;  Location: [facility];  Service: Plastics;  Laterality: Right;   APPLICATION OF A-CELL OF EXTREMITY Right 07/16/2017   Procedure: CELLERATE PLACEMENT;  Surgeon: Lowery Estefana RAMAN, DO;  Location: [facility];  Service: Plastics;  Laterality: Right;   APPLICATION OF WOUND VAC Right 09/18/2016   Procedure: APPLICATION OF WOUND VAC (PT. WILL BRING HIS OWN);  Surgeon: Lowery Estefana RAMAN, DO;  Location: [facility];  Service: Plastics;  Laterality: Right;   APPLICATION OF WOUND VAC Right 10/30/2016   Procedure: APPLICATION OF WOUND VAC;  Surgeon: Lowery Estefana RAMAN, DO;  Location: [facility];  Service: Plastics;  Laterality: Right;   APPLICATION OF WOUND VAC Right 12/01/2016   Procedure: APPLICATION OF WOUND VAC;  Surgeon: Lowery Estefana RAMAN, DO;  Location: [facility];  Service: Plastics;  Laterality: Right;   BONE BIOPSY Left 01/04/2018   Procedure: LEFT FOOT BONE BIOPSY;  Surgeon: Gretel Ozell PARAS, DPM;  Location: [facility];  Service: Podiatry;  Laterality: Left;  BONE BIOPSY Right 09/03/2020   Procedure: BONE BIOPSY;  Surgeon: Gretel Ozell PARAS, DPM;  Location: [facility];  Service: Podiatry;  Laterality: Right;   CIRCUMCISION  2006   COLONOSCOPY  04/08/2021   DEBRIDEMENT  FOOT Right 07/16/2017   ESOPHAGOGASTRODUODENOSCOPY (EGD) WITH PROPOFOL  N/A 11/07/2021   Procedure: ESOPHAGOGASTRODUODENOSCOPY (EGD) WITH PROPOFOL ;  Surgeon: San Sandor GAILS, DO;  Location: [facility];  Service: Gastroenterology;  Laterality: N/A;   GIVENS CAPSULE  STUDY N/A 11/07/2021   Procedure: GIVENS CAPSULE STUDY;  Surgeon: [place] Sandor GAILS, DO;  Location: [facility];  Service: Gastroenterology;  Laterality: N/A;   I & D EXTREMITY Right 09/18/2016   Procedure: IRRIGATION AND DEBRIDEMENT OF RIGHT LATERAL DIABLTIC FOOT ULCER;  Surgeon: Lowery Estefana RAMAN, DO;  Location: [facility];  Service: Plastics;  Laterality: Right;   I & D EXTREMITY Right 03/11/2017   Procedure: IRRIGATION AND DEBRIDEMENT OF RIGHT LATERAL FOOT WOUND WITH CELLERATE COLLAGEN;  Surgeon: Lowery Estefana RAMAN, DO;  Location: [facility];  Service: Plastics;  Laterality: Right;   INCISION AND DRAINAGE OF WOUND Right 08/13/2016   Procedure: IRRIGATION AND DEBRIDEMENT WOUND RIGHT FOOT;  Surgeon: Lowery Estefana RAMAN, DO;  Location: [facility];  Service: Plastics;  Laterality: Right;   IR PATIENT EVAL TECH 0-60 MINS  01/12/2020   LAPAROSCOPIC CHOLECYSTECTOMY  09-09-2006   dr lily   LOWER EXTREMITY ANGIOGRAPHY N/A 10/27/2016   Procedure: Lower Extremity Angiography;  Surgeon: Eliza Lonni RAMAN, MD;  Location: [facility];  Service: Cardiovascular;  Laterality: N/A;   MASS EXCISION Right 10/30/2016   Procedure: EXCISION RIGHT FOOT WOUND WITH VAC PLACEMENT;  Surgeon: Lowery Estefana RAMAN, DO;  Location: [facility];  Service: Plastics;  Laterality: Right;   MASS EXCISION Right 07/16/2017   Procedure: EXCISION OF RIGHT FOOT WOUND WITH CELLERATE PLACEMENT;  Surgeon: Lowery Estefana RAMAN, DO;  Location: [facility];  Service: Plastics;  Laterality: Right;   METATARSAL HEAD EXCISION Left 01/04/2018   Procedure: 5TH METATARSAL RESECTION,  WOUND CLOSURE,  Adjacent Tissue Transfer.;  Surgeon: Gretel Ozell PARAS, DPM;  Location: [facility];  Service: Podiatry;  Laterality: Left;   SKIN SPLIT GRAFT Right 12/01/2016   Procedure: SKIN GRAFT SPLIT THICKNESS TO RIGHT LATERAL FOOT WOUND 9 X 3 CM;  Surgeon: Lowery Estefana RAMAN, DO;   Location: [facility];  Service: Plastics;  Laterality: Right;   wisdom teeth extracted  1984   WOUND DEBRIDEMENT Right 09/03/2020   Procedure: DEBRIDEMENT WOUND, INSERTION OF ANTIBIOTIC BEADS;  Surgeon: Gretel Ozell PARAS, DPM;  Location: [facility];  Service: Podiatry;  Laterality: Right;       Latest Ref Rng & Units 02/28/2023    6:00 AM 02/27/2023    4:46 AM 02/26/2023    5:16 AM  CBC  WBC 4.0 - 10.5 K/uL 7.4  7.1  8.9   Hemoglobin 13.0 - 17.0 g/dL 88.3  88.9  88.7   Hematocrit 39.0 - 52.0 % 38.2  34.7  35.8   Platelets 150 - 400 K/uL 332  293  313        Latest Ref Rng & Units 02/28/2023    6:00 AM 02/27/2023    4:46 AM 02/26/2023    5:16 AM  BMP  Glucose 70 - 99 mg/dL 849  94  84   BUN 6 - 20 mg/dL 15  16  20    Creatinine 0.61 - 1.24 mg/dL 8.65  8.45  8.38   Sodium 135 - 145  mmol/L 135  136  135   Potassium 3.5 - 5.1 mmol/L 4.2  3.7  3.5   Chloride 98 - 111 mmol/L 101  103  100   CO2 22 - 32 mmol/L 27  26  23    Calcium  8.9 - 10.3 mg/dL 9.2  8.8  8.9      Objective:   Vitals:   02/28/23 0608 02/28/23 1341  BP: (!) 141/85 139/77  Pulse: 68 70  Resp: 15 20  Temp: 98.4 F (36.9 C) 98.4 F (36.9 C)  SpO2: 100% 100%    General:AA&O x 3. Normal mood and affect   Vascular: DP and PT pulses 2/4 bilateral. Brisk capillary refill to all digits. Pedal hair present   Neruological. Epicritic sensation grossly intact.   Derm: Dressing clean dry and intact with no strike through noted.   MSK: MMT 5/5 in dorsiflexion, plantar flexion, inversion and eversion. Normal joint ROM without pain or crepitus.   Radiographs:    IMPRESSION: 1. Moderate great toe soft tissue swelling with irregularity of the distal great toe soft tissues suspicious for an ulcer and sinus tract. 2. New mild cortical erosion at the distal aspect of the great toe distal phalanx with a curvilinear lucency that may represent bone fragmentation. This is suspicious for acute osteomyelitis. 3. Redemonstration of  amputation of the second toe middle and distal phalanges and the distal aspect of the fifth metatarsal.    Assessment & Plan:  Patient was evaluated and treated and all questions answered.  DX: s/p left partial hallux amputation  Patient will keep dressing clean dry and intact. He may be heel weight bearing in surgical shoe. Blood culture positive for staph capitus. OR cultures pending. Appreciate ID recommendations for antibiotics.  Will follow-up in clinic in about a week after discharge. Already has appointment with Dr. Tobie scheduled.   [name], DPM  Accessible via secure chat for questions or concerns.

## 2023-02-28 NOTE — Progress Notes (Signed)
 PROGRESS NOTE    [name]  FMW:[id number] DOB: 1970/02/03 DOA: 02/25/2023 PCP: Katrinka Garnette KIDD, MD   Brief Narrative: 53 year old with past medical history significant for CKD stage II, diabetes type 2, hypertension, morbid obesity, chronic mild elevation of liver function test, pituitary adenoma, history of ray amputation of the right foot presents with left great toe swelling and blister that is started 3 weeks prior to admission.  Patient was noted to be febrile, tachycardic tachypneic.  Admitted with sepsis secondary to left great toe osteomyelitis.   Assessment & Plan:   Principal Problem:   Diabetic infection of left foot (HCC) Active Problems:   Acute renal failure superimposed on stage 3a chronic kidney disease (HCC)   Osteomyelitis of great toe of left foot (HCC)   1-Sepsis secondary to Acute osteomyelitis of the left great toe, POA Proteus Bacteremia.  Blood culture positive for Staph Capitis  -Treated with IV fluids.  -Continue with IV antibiotics: Linezolid , Ceftriaxone  and flagyl .  -MRI left Foot: Plantar soft tissue ulceration at the tip of the great toe with sinus tract extending along the inferior aspect of the first distal phalanx. Associated osteomyelitis of the first distal phalanx and probable first IP joint septic arthritis. Complete tear of the flexor hallucis longus tendon at the base of the first distal phalanx. Small 1 cm abscess overlying the distal extensor hallucis longus tendon. 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 -Blood cultures today 2/08. Follow OR culture: pending   -ID consulted.   AKI on CKD stage II: Previous creatinine baseline 1.4 Cr down to 1.6--1.7--1.5--1.3 Continue with IV fluids.  Improved.   Diabetes type 2, hypoglycemia.  Continue to hold Tresiba .  SSI Monitor.   Chronic transaminates Hold statins.   Hyperlipidemia Holding statins due to transaminases.    Hypertension Holding ramipril /HCTZ due to AKI PR hydralazine .   Morbid obesity Needs life style modification.   Estimated body mass index is 41.65 kg/m as calculated from the following:   Height as of this encounter: 6' (1.829 m).   Weight as of this encounter: 139.3 kg.   DVT prophylaxis: Lovenox  Code Status: Full code Family Communication: care discussed with patient Disposition Plan:  Status is: Inpatient Remains inpatient appropriate because: management of Osteomyelitis.     Consultants:  Podiatry  Procedures:  none Antimicrobials:    Subjective: Doing well, waiting for breakfast. We discussed blood culture results. Plan to consult ID>   Objective: Vitals:   02/27/23 2114 02/28/23 0154 02/28/23 0500 02/28/23 0608  BP: 125/79 134/84  (!) 141/85  Pulse: 70 67  68  Resp: 16 15  15   Temp: 98.6 F (37 C) 97.6 F (36.4 C)  98.4 F (36.9 C)  TempSrc: Oral Oral  Oral  SpO2: 98% 98%  100%  Weight:   (!) 139.3 kg   Height:        Intake/Output Summary (Last 24 hours) at 02/28/2023 0827 Last data filed at 02/28/2023 9361 Gross per 24 hour  Intake 2064.13 ml  Output 1050 ml  Net 1014.13 ml   Filed Weights   02/26/23 0548 02/27/23 0642 02/28/23 0500  Weight: (!) 139.7 kg (!) 137.5 kg (!) 139.3 kg    Examination:  General exam: NAD Respiratory system: CTA Cardiovascular system: S 1, S 2 RRR Gastrointestinal system: BS present, soft, nt Extremities: Left foot with dressing.   Data Reviewed: I have personally reviewed following labs and imaging studies  CBC: Recent Labs  Lab 02/25/23 1159 02/25/23 1834  02/26/23 0516 02/27/23 0446 02/28/23 0600  WBC 12.7* 13.2* 8.9 7.1 7.4  NEUTROABS 11.3*  --   --   --   --   HGB 12.5* 12.6* 11.2* 11.0* 11.6*  HCT 40.7 41.1 35.8* 34.7* 38.2*  MCV 83.9 86.0 82.3 82.2 82.2  PLT 368 306 313 293 332   Basic Metabolic Panel: Recent Labs  Lab 02/25/23 1159 02/26/23 0516 02/27/23 0446 02/28/23 0600  NA 138 135  136 135  K 3.6 3.5 3.7 4.2  CL 103 100 103 101  CO2 22 23 26 27   GLUCOSE 124* 84 94 150*  BUN 23* 20 16 15   CREATININE 1.78* 1.61* 1.54* 1.34*  CALCIUM  9.3 8.9 8.8* 9.2   GFR: Estimated Creatinine Clearance: 93.3 mL/min (A) (by C-G formula based on SCr of 1.34 mg/dL (H)). Liver Function Tests: Recent Labs  Lab 02/25/23 1159 02/26/23 0516  AST 77* 74*  ALT 91* 98*  ALKPHOS 114 99  BILITOT 1.4* 1.4*  PROT 8.6* 7.3  ALBUMIN 3.6 3.0*   No results for input(s): LIPASE, AMYLASE in the last 168 hours. No results for input(s): AMMONIA in the last 168 hours. Coagulation Profile: Recent Labs  Lab 02/25/23 1159  INR 1.0   Cardiac Enzymes: No results for input(s): CKTOTAL, CKMB, CKMBINDEX, TROPONINI in the last 168 hours. BNP (last 3 results) No results for input(s): PROBNP in the last 8760 hours. HbA1C: No results for input(s): HGBA1C in the last 72 hours. CBG: Recent Labs  Lab 02/27/23 1753 02/27/23 1823 02/27/23 1840 02/27/23 2012 02/28/23 0734  GLUCAP 57* 67* 72 119* 141*   Lipid Profile: No results for input(s): CHOL, HDL, LDLCALC, TRIG, CHOLHDL, LDLDIRECT in the last 72 hours. Thyroid Function Tests: No results for input(s): TSH, T4TOTAL, FREET4, T3FREE, THYROIDAB in the last 72 hours. Anemia Panel: No results for input(s): VITAMINB12, FOLATE, FERRITIN, TIBC, IRON, RETICCTPCT in the last 72 hours. Sepsis Labs: Recent Labs  Lab 02/25/23 1211 02/25/23 1500  LATICACIDVEN 1.6 0.8    Recent Results (from the past 240 hours)  Blood Culture ID Panel (Reflexed)     Status: Abnormal   Collection Time: 02/25/23 11:43 AM  Result Value Ref Range Status   Enterococcus faecalis NOT DETECTED NOT DETECTED Final   Enterococcus Faecium NOT DETECTED NOT DETECTED Final   Listeria monocytogenes NOT DETECTED NOT DETECTED Final   Staphylococcus species NOT DETECTED NOT DETECTED Final   Staphylococcus aureus (BCID) NOT  DETECTED NOT DETECTED Final   Staphylococcus epidermidis NOT DETECTED NOT DETECTED Final   Staphylococcus lugdunensis NOT DETECTED NOT DETECTED Final   Streptococcus species NOT DETECTED NOT DETECTED Final   Streptococcus agalactiae NOT DETECTED NOT DETECTED Final   Streptococcus pneumoniae NOT DETECTED NOT DETECTED Final   Streptococcus pyogenes NOT DETECTED NOT DETECTED Final   A.calcoaceticus-baumannii NOT DETECTED NOT DETECTED Final   Bacteroides fragilis NOT DETECTED NOT DETECTED Final   Enterobacterales DETECTED (A) NOT DETECTED Final    Comment: Enterobacterales represent a large order of gram negative bacteria, not a single organism. CRITICAL RESULT CALLED TO, READ BACK BY AND VERIFIED WITH: PHARMD M.LILLISTON AT 1029 ON 02/27/2023 BY T.SAAD.    Enterobacter cloacae complex NOT DETECTED NOT DETECTED Final   Escherichia coli NOT DETECTED NOT DETECTED Final   Klebsiella aerogenes NOT DETECTED NOT DETECTED Final   Klebsiella oxytoca NOT DETECTED NOT DETECTED Final   Klebsiella pneumoniae NOT DETECTED NOT DETECTED Final   Proteus species DETECTED (A) NOT DETECTED Final    Comment: CRITICAL RESULT CALLED  TO, READ BACK BY AND VERIFIED WITH: PHARMD M.LILLISTON AT 1029 ON 02/27/2023 BY T.SAAD.    Salmonella species NOT DETECTED NOT DETECTED Final   Serratia marcescens NOT DETECTED NOT DETECTED Final   Haemophilus influenzae NOT DETECTED NOT DETECTED Final   Neisseria meningitidis NOT DETECTED NOT DETECTED Final   Pseudomonas aeruginosa NOT DETECTED NOT DETECTED Final   Stenotrophomonas maltophilia NOT DETECTED NOT DETECTED Final   Candida albicans NOT DETECTED NOT DETECTED Final   Candida auris NOT DETECTED NOT DETECTED Final   Candida glabrata NOT DETECTED NOT DETECTED Final   Candida krusei NOT DETECTED NOT DETECTED Final   Candida parapsilosis NOT DETECTED NOT DETECTED Final   Candida tropicalis NOT DETECTED NOT DETECTED Final   Cryptococcus neoformans/gattii NOT DETECTED NOT  DETECTED Final   CTX-M ESBL NOT DETECTED NOT DETECTED Final   Carbapenem resistance IMP NOT DETECTED NOT DETECTED Final   Carbapenem resistance KPC NOT DETECTED NOT DETECTED Final   Carbapenem resistance NDM NOT DETECTED NOT DETECTED Final   Carbapenem resist OXA 48 LIKE NOT DETECTED NOT DETECTED Final   Carbapenem resistance VIM NOT DETECTED NOT DETECTED Final    Comment: Performed at [facility], [address], [place], [place] [address]  Culture, blood (Routine x 2)     Status: None (Preliminary result)   Collection Time: 02/25/23 12:00 PM   Specimen: BLOOD  Result Value Ref Range Status   Specimen Description   Final    BLOOD SITE NOT SPECIFIED Performed at [facility], [address] [place], [place] [address]    Special Requests   Final    BOTTLES DRAWN AEROBIC AND ANAEROBIC Blood Culture adequate volume Performed at [facility], [address], [place], [place] [address]    Culture  Setup Time   Final    GRAM NEGATIVE RODS ANAEROBIC BOTTLE ONLY CRITICAL RESULT CALLED TO, READ BACK BY AND VERIFIED WITH: PHARMD M.LILLISTON AT 1029 ON 02/27/2023 BY T.SAAD. Performed at [facility], [address] [place], [place] [address]    Culture GRAM NEGATIVE RODS  Final   Report Status PENDING  Incomplete  Culture, blood (Routine x 2)     Status: Abnormal (Preliminary result)   Collection Time: 02/25/23 12:00 PM   Specimen: BLOOD  Result Value Ref Range Status   Specimen Description   Final    BLOOD SITE NOT SPECIFIED Performed at [facility], [address], [place], [place] [address]    Special Requests   Final    BOTTLES DRAWN AEROBIC AND ANAEROBIC Blood Culture adequate volume Performed at [facility], [address] [place], [place] [address]    Culture  Setup Time   Final    GRAM POSITIVE COCCI IN CLUSTERS ANAEROBIC BOTTLE ONLY CRITICAL RESULT CALLED TO, READ BACK BY AND VERIFIED WITH: PHARMD MARY SWAYNE  ON 02/26/23 @ 1559 BY DRT    Culture (A)  Final    STAPHYLOCOCCUS CAPITIS THE SIGNIFICANCE OF ISOLATING THIS ORGANISM FROM A SINGLE SET OF BLOOD CULTURES WHEN MULTIPLE SETS ARE DRAWN IS UNCERTAIN. PLEASE NOTIFY THE MICROBIOLOGY DEPARTMENT WITHIN ONE WEEK IF SPECIATION AND SENSITIVITIES ARE REQUIRED. Performed at [facility], [address], [place], [place] [address]    Report Status PENDING  Incomplete  Blood Culture ID Panel (Reflexed)     Status: Abnormal   Collection Time: 02/25/23 12:00 PM  Result Value Ref Range Status   Enterococcus faecalis NOT DETECTED NOT  DETECTED Final   Enterococcus Faecium NOT DETECTED NOT DETECTED Final   Listeria monocytogenes NOT DETECTED NOT DETECTED Final   Staphylococcus species DETECTED (A) NOT DETECTED Final    Comment: CRITICAL RESULT CALLED TO, READ BACK BY AND VERIFIED WITH: PHARMD MARY SWAYNE ON 02/26/23 @ 1559 BY DRT    Staphylococcus aureus (BCID) NOT DETECTED NOT DETECTED Final   Staphylococcus epidermidis NOT DETECTED NOT DETECTED Final   Staphylococcus lugdunensis NOT DETECTED NOT DETECTED Final   Streptococcus species NOT DETECTED NOT DETECTED Final   Streptococcus agalactiae NOT DETECTED NOT DETECTED Final   Streptococcus pneumoniae NOT DETECTED NOT DETECTED Final   Streptococcus pyogenes NOT DETECTED NOT DETECTED Final   A.calcoaceticus-baumannii NOT DETECTED NOT DETECTED Final   Bacteroides fragilis NOT DETECTED NOT DETECTED Final   Enterobacterales NOT DETECTED NOT DETECTED Final   Enterobacter cloacae complex NOT DETECTED NOT DETECTED Final   Escherichia coli NOT DETECTED NOT DETECTED Final   Klebsiella aerogenes NOT DETECTED NOT DETECTED Final   Klebsiella oxytoca NOT DETECTED NOT DETECTED Final   Klebsiella pneumoniae NOT DETECTED NOT DETECTED Final   Proteus species NOT DETECTED NOT DETECTED Final   Salmonella species NOT DETECTED NOT DETECTED Final   Serratia marcescens NOT DETECTED NOT DETECTED Final   Haemophilus  influenzae NOT DETECTED NOT DETECTED Final   Neisseria meningitidis NOT DETECTED NOT DETECTED Final   Pseudomonas aeruginosa NOT DETECTED NOT DETECTED Final   Stenotrophomonas maltophilia NOT DETECTED NOT DETECTED Final   Candida albicans NOT DETECTED NOT DETECTED Final   Candida auris NOT DETECTED NOT DETECTED Final   Candida glabrata NOT DETECTED NOT DETECTED Final   Candida krusei NOT DETECTED NOT DETECTED Final   Candida parapsilosis NOT DETECTED NOT DETECTED Final   Candida tropicalis NOT DETECTED NOT DETECTED Final   Cryptococcus neoformans/gattii NOT DETECTED NOT DETECTED Final    Comment: Performed at [facility], [address], [place], [place] [address]  Surgical pcr screen     Status: None   Collection Time: 02/26/23  7:37 PM   Specimen: Nasal Mucosa; Nasal Swab  Result Value Ref Range Status   MRSA, PCR NEGATIVE NEGATIVE Final   Staphylococcus aureus NEGATIVE NEGATIVE Final    Comment: (NOTE) The Xpert SA Assay (FDA approved for NASAL specimens in patients 12 years of age and older), is one component of a comprehensive surveillance program. It is not intended to diagnose infection nor to guide or monitor treatment. Performed at [facility], [address], [place], [place] [address]   Aerobic/Anaerobic Culture w Gram Stain (surgical/deep wound)     Status: None (Preliminary result)   Collection Time: 02/27/23  5:47 PM   Specimen: Path fluid; Tissue  Result Value Ref Range Status   Specimen Description   Final    FLUID Performed at [facility], [address] [place], [place] [address]    Special Requests   Final    NONE Performed at [facility], [address] [place], [place] [address]    Gram Stain   Final    RARE WBC PRESENT, PREDOMINANTLY PMN FEW GRAM POSITIVE COCCI Performed at [facility], [address], [place], [place] [address]    Culture PENDING  Incomplete   Report Status  PENDING  Incomplete  Aerobic/Anaerobic Culture w Gram Stain (surgical/deep wound)     Status: None (Preliminary result)   Collection Time: 02/27/23  5:49 PM   Specimen: PATH Bone resection; Tissue  Result  Value Ref Range Status   Specimen Description   Final    BONE BONE RESECTION, LEFT HALLUX RESIDUAL BONE Performed at [facility], [address] [place], [place] [address]    Special Requests   Final    NONE Performed at [facility], [address] [place], [place] [address]    Gram Stain   Final    NO WBC SEEN NO ORGANISMS SEEN Performed at [facility], [address] [place], [place] [address]    Culture PENDING  Incomplete   Report Status PENDING  Incomplete         Radiology Studies: DG Foot 2 Views Left Result Date: 02/27/2023 CLINICAL DATA:  Osteomyelitis, left great toe amputation EXAM: LEFT FOOT - 2 VIEW COMPARISON:  02/25/2023 FINDINGS: Frontal and lateral views of the left foot are obtained. Interval amputation of the first digit at the level of the distal margin first proximal phalanx. Prior postsurgical changes of the second and fifth digits again noted. No acute fractures. Soft tissue swelling throughout the forefoot and surgical bed of the first digit. IMPRESSION: 1. Postsurgical changes from left first digit amputation. Soft tissue swelling throughout the forefoot and surgical bed. Electronically Signed   By: Ozell Daring M.D.   On: 02/27/2023 21:43        Scheduled Meds:  aspirin  EC  81 mg Oral Daily   enoxaparin  (LOVENOX ) injection  70 mg Subcutaneous Q24H   fenofibrate   54 mg Oral Daily   insulin  aspart  0-15 Units Subcutaneous TID WC   insulin  aspart  0-5 Units Subcutaneous QHS   omega-3 acid ethyl esters  1 g Oral Daily   polyethylene glycol  17 g Oral Daily   sodium chloride  flush  3 mL Intravenous Q12H   Continuous Infusions:  cefTRIAXone  (ROCEPHIN )  IV 2 g (02/27/23 1327)   linezolid  (ZYVOX ) IV  600 mg (02/27/23 2212)   metronidazole  500 mg (02/28/23 0122)     LOS: 3 days    Time spent: 35 Minutes    Shaye Lagace A Shandon Matson, MD [facility]   If 7PM-7AM, please contact night-coverage [email or web address]  02/28/2023, 8:27 AM

## 2023-02-28 NOTE — Consult Note (Signed)
 [facility]       Reason for Consult: osteomyelitis    Referring Physician: Dr. Madelyne  Principal Problem:   Diabetic infection of left foot [name]   Acute renal failure superimposed on stage 3a chronic kidney disease (HCC)   Osteomyelitis of great toe of left foot (HCC)    aspirin  EC  81 mg Oral Daily   enoxaparin  (LOVENOX ) injection  70 mg Subcutaneous Q24H   fenofibrate   54 mg Oral Daily   insulin  aspart  0-15 Units Subcutaneous TID WC   insulin  aspart  0-5 Units Subcutaneous QHS   omega-3 acid ethyl esters  1 g Oral Daily   polyethylene glycol  17 g Oral Daily   sodium chloride  flush  3 mL Intravenous Q12H    Recommendations: Augmentin  for 10 days at discharge   We are available for follow up as needed.   Assessment: He has osteomyelitis now s/p amputation.  Plan for continuation of anatibiotics for potential remaining soft tissue infection.     Evaluation of this patient requires complex antimicrobial therapy evaluation and counseling + isolation needs for disease transmission risk assessment and mitigation.    HPI: [name] is a 53 y.o. male with a history of CKD, diabetes, HTN, obesity and previous history of right foot osteomyelitis with amputation of toes 3-5with left great toe swelling, further osteomyelitis in 2023 s/p prolonged IV antibiotics and now found to have osteomyelitis of the left hallux.  MRI c/w osteomyelitis of the first distal phalanx and probable first IP joint septic arthritis.   He is now s/p partial ray amputation.  A wound swab was taken and GPC on gram stain.  Bone culture sent with no growth.  Most recent Hgb A1c 6.5.    Review of Systems:  Constitutional: negative for fevers and chills All other systems reviewed and are negative    Past Medical History:  Diagnosis Date   Cellulitis and abscess of foot    05/ 2021 left   CKD (chronic kidney disease), stage III (HCC) 06-17-2019  per pt last visit 05-29-2016 (note in care everywhere)  currently followed by pcp   nephrologist-  [name] [name] ([facility] in [place])     Diabetic peripheral neuropathy (HCC)    Elevated LFTs    followed by pcp   Hypertension    followed by pcp---- (06-17-2019 pt stated never had a stress test)   Iron deficiency anemia    Mixed hyperlipidemia    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   Prolactinoma, 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   (06-17-2019 pt stated has been stable with no changes and followed by pcp)   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HCC)    11-06-2016  lower extremity angiography ([name]  mil tibial disease w/ occlusion of the distal peroneal artery and dorsalis pedis artery but has widely patent posterior tibial and anterior tibial arterties   Type 2 diabetes mellitus treated with insulin  (HCC)    followed by pcp   (06-17-2019  pt stated checks blood sugar daily in am,  fasting sugar-- average 71)   Vitamin B 12 deficiency     Social History   Tobacco Use   Smoking status: Never   Smokeless tobacco: Never  Vaping Use   Vaping status: Never Used  Substance Use Topics   Alcohol use: No   Drug use: Never    Family History  Problem Relation Age of Onset   Sarcoidosis Mother  Stroke Mother    Stroke Father        early 17s. former smoker   Diabetes Father    Hypertension Father    Hypertension Sister    Diabetes Paternal Grandmother    Congenital heart disease Paternal Grandmother        died of complications   Healthy Daughter    Colon cancer Neg Hx    Colon polyps Neg Hx    Esophageal cancer Neg Hx    Rectal cancer Neg Hx    Stomach cancer Neg Hx     No Known Allergies  Physical Exam: Constitutional: in no apparent distress  Vitals:   02/28/23 0608 02/28/23 1341  BP: (!) 141/85 139/77  Pulse: 68 70  Resp: 15 20  Temp: 98.4 F (36.9 C) 98.4 F (36.9 C)  SpO2: 100% 100%   EYES: anicteric Respiratory: normal respiratory  effort Musculoskeletal: wrapped  Lab Results  Component Value Date   WBC 7.4 02/28/2023   HGB 11.6 (L) 02/28/2023   HCT 38.2 (L) 02/28/2023   MCV 82.2 02/28/2023   PLT 332 02/28/2023    Lab Results  Component Value Date   CREATININE 1.34 (H) 02/28/2023   BUN 15 02/28/2023   NA 135 02/28/2023   K 4.2 02/28/2023   CL 101 02/28/2023   CO2 27 02/28/2023    Lab Results  Component Value Date   ALT 98 (H) 02/26/2023   AST 74 (H) 02/26/2023   ALKPHOS 99 02/26/2023     Microbiology: Recent Results (from the past 240 hours)  Blood Culture ID Panel (Reflexed)     Status: Abnormal   Collection Time: 02/25/23 11:43 AM  Result Value Ref Range Status   Enterococcus faecalis NOT DETECTED NOT DETECTED Final   Enterococcus Faecium NOT DETECTED NOT DETECTED Final   Listeria monocytogenes NOT DETECTED NOT DETECTED Final   Staphylococcus species NOT DETECTED NOT DETECTED Final   Staphylococcus aureus (BCID) NOT DETECTED NOT DETECTED Final   Staphylococcus epidermidis NOT DETECTED NOT DETECTED Final   Staphylococcus lugdunensis NOT DETECTED NOT DETECTED Final   Streptococcus species NOT DETECTED NOT DETECTED Final   Streptococcus agalactiae NOT DETECTED NOT DETECTED Final   Streptococcus pneumoniae NOT DETECTED NOT DETECTED Final   Streptococcus pyogenes NOT DETECTED NOT DETECTED Final   A.calcoaceticus-baumannii NOT DETECTED NOT DETECTED Final   Bacteroides fragilis NOT DETECTED NOT DETECTED Final   Enterobacterales DETECTED (A) NOT DETECTED Final    Comment: Enterobacterales represent a large order of gram negative bacteria, not a single organism. CRITICAL RESULT CALLED TO, READ BACK BY AND VERIFIED WITH: PHARMD M.LILLISTON AT 1029 ON 02/27/2023 BY T.SAAD.    Enterobacter cloacae complex NOT DETECTED NOT DETECTED Final   Escherichia coli NOT DETECTED NOT DETECTED Final   Klebsiella aerogenes NOT DETECTED NOT DETECTED Final   Klebsiella oxytoca NOT DETECTED NOT DETECTED Final    Klebsiella pneumoniae NOT DETECTED NOT DETECTED Final   Proteus species DETECTED (A) NOT DETECTED Final    Comment: CRITICAL RESULT CALLED TO, READ BACK BY AND VERIFIED WITH: PHARMD M.LILLISTON AT 1029 ON 02/27/2023 BY T.SAAD.    Salmonella species NOT DETECTED NOT DETECTED Final   Serratia marcescens NOT DETECTED NOT DETECTED Final   Haemophilus influenzae NOT DETECTED NOT DETECTED Final   Neisseria meningitidis NOT DETECTED NOT DETECTED Final   Pseudomonas aeruginosa NOT DETECTED NOT DETECTED Final   Stenotrophomonas maltophilia NOT DETECTED NOT DETECTED Final   Candida albicans NOT DETECTED NOT DETECTED Final   Candida auris  NOT DETECTED NOT DETECTED Final   Candida glabrata NOT DETECTED NOT DETECTED Final   Candida krusei NOT DETECTED NOT DETECTED Final   Candida parapsilosis NOT DETECTED NOT DETECTED Final   Candida tropicalis NOT DETECTED NOT DETECTED Final   Cryptococcus neoformans/gattii NOT DETECTED NOT DETECTED Final   CTX-M ESBL NOT DETECTED NOT DETECTED Final   Carbapenem resistance IMP NOT DETECTED NOT DETECTED Final   Carbapenem resistance KPC NOT DETECTED NOT DETECTED Final   Carbapenem resistance NDM NOT DETECTED NOT DETECTED Final   Carbapenem resist OXA 48 LIKE NOT DETECTED NOT DETECTED Final   Carbapenem resistance VIM NOT DETECTED NOT DETECTED Final    Comment: Performed at [facility], [address], [place], [place] [address]  Culture, blood (Routine x 2)     Status: Abnormal (Preliminary result)   Collection Time: 02/25/23 12:00 PM   Specimen: BLOOD  Result Value Ref Range Status   Specimen Description   Final    BLOOD SITE NOT SPECIFIED Performed at [facility], [address], [place], [place] [address]    Special Requests   Final    BOTTLES DRAWN AEROBIC AND ANAEROBIC Blood Culture adequate volume Performed at [facility], [address] [place], [place] [address]    Culture  Setup Time   Final    GRAM NEGATIVE  RODS ANAEROBIC BOTTLE ONLY CRITICAL RESULT CALLED TO, READ BACK BY AND VERIFIED WITH: PHARMD M.LILLISTON AT 1029 ON 02/27/2023 BY T.SAAD.    Culture (A)  Final    PROTEUS MIRABILIS SUSCEPTIBILITIES TO FOLLOW Performed at [facility], [address], [place], [place] [address]    Report Status PENDING  Incomplete  Culture, blood (Routine x 2)     Status: Abnormal   Collection Time: 02/25/23 12:00 PM   Specimen: BLOOD  Result Value Ref Range Status   Specimen Description   Final    BLOOD SITE NOT SPECIFIED Performed at [facility], [address], [place], [place] [address]    Special Requests   Final    BOTTLES DRAWN AEROBIC AND ANAEROBIC Blood Culture adequate volume Performed at [facility], [name], [place], [place] [address]    Culture  Setup Time   Final    GRAM POSITIVE COCCI IN CLUSTERS ANAEROBIC BOTTLE ONLY CRITICAL RESULT CALLED TO, READ BACK BY AND VERIFIED WITH: PHARMD MARY SWAYNE ON 02/26/23 @ 1559 BY DRT    Culture (A)  Final    STAPHYLOCOCCUS CAPITIS THE SIGNIFICANCE OF ISOLATING THIS ORGANISM FROM A SINGLE SET OF BLOOD CULTURES WHEN MULTIPLE SETS ARE DRAWN IS UNCERTAIN. PLEASE NOTIFY THE MICROBIOLOGY DEPARTMENT WITHIN ONE WEEK IF SPECIATION AND SENSITIVITIES ARE REQUIRED. Performed at [facility], [address], [place], [place] [address]    Report Status 02/28/2023 FINAL  Final  Blood Culture ID Panel (Reflexed)     Status: Abnormal   Collection Time: 02/25/23 12:00 PM  Result Value Ref Range Status   Enterococcus faecalis NOT DETECTED NOT DETECTED Final   Enterococcus Faecium NOT DETECTED NOT DETECTED Final   Listeria monocytogenes NOT DETECTED NOT DETECTED Final   Staphylococcus species DETECTED (A) NOT DETECTED Final    Comment: CRITICAL RESULT CALLED TO, READ BACK BY AND VERIFIED WITH: PHARMD MARY SWAYNE ON 02/26/23 @ 1559 BY DRT    Staphylococcus aureus (BCID) NOT DETECTED NOT DETECTED Final   Staphylococcus  epidermidis NOT DETECTED NOT DETECTED Final   Staphylococcus lugdunensis NOT  DETECTED NOT DETECTED Final   Streptococcus species NOT DETECTED NOT DETECTED Final   Streptococcus agalactiae NOT DETECTED NOT DETECTED Final   Streptococcus pneumoniae NOT DETECTED NOT DETECTED Final   Streptococcus pyogenes NOT DETECTED NOT DETECTED Final   A.calcoaceticus-baumannii NOT DETECTED NOT DETECTED Final   Bacteroides fragilis NOT DETECTED NOT DETECTED Final   Enterobacterales NOT DETECTED NOT DETECTED Final   Enterobacter cloacae complex NOT DETECTED NOT DETECTED Final   Escherichia coli NOT DETECTED NOT DETECTED Final   Klebsiella aerogenes NOT DETECTED NOT DETECTED Final   Klebsiella oxytoca NOT DETECTED NOT DETECTED Final   Klebsiella pneumoniae NOT DETECTED NOT DETECTED Final   Proteus species NOT DETECTED NOT DETECTED Final   Salmonella species NOT DETECTED NOT DETECTED Final   Serratia marcescens NOT DETECTED NOT DETECTED Final   Haemophilus influenzae NOT DETECTED NOT DETECTED Final   Neisseria meningitidis NOT DETECTED NOT DETECTED Final   Pseudomonas aeruginosa NOT DETECTED NOT DETECTED Final   Stenotrophomonas maltophilia NOT DETECTED NOT DETECTED Final   Candida albicans NOT DETECTED NOT DETECTED Final   Candida auris NOT DETECTED NOT DETECTED Final   Candida glabrata NOT DETECTED NOT DETECTED Final   Candida krusei NOT DETECTED NOT DETECTED Final   Candida parapsilosis NOT DETECTED NOT DETECTED Final   Candida tropicalis NOT DETECTED NOT DETECTED Final   Cryptococcus neoformans/gattii NOT DETECTED NOT DETECTED Final    Comment: Performed at [facility], [address] [place], [place] [address]  Surgical pcr screen     Status: None   Collection Time: 02/26/23  7:37 PM   Specimen: Nasal Mucosa; Nasal Swab  Result Value Ref Range Status   MRSA, PCR NEGATIVE NEGATIVE Final   Staphylococcus aureus NEGATIVE NEGATIVE Final    Comment: (NOTE) The Xpert SA Assay (FDA approved for  NASAL specimens in patients 25 years of age and older), is one component of a comprehensive surveillance program. It is not intended to diagnose infection nor to guide or monitor treatment. Performed at [facility], [address], [place], [place] [address]   Aerobic/Anaerobic Culture w Gram Stain (surgical/deep wound)     Status: None (Preliminary result)   Collection Time: 02/27/23  5:47 PM   Specimen: Path fluid; Tissue  Result Value Ref Range Status   Specimen Description   Final    FLUID Performed at [facility], [address], [place], [place] [address]    Special Requests   Final    NONE Performed at [facility], [address] [place], [place] [address]    Gram Stain   Final    RARE WBC PRESENT, PREDOMINANTLY PMN FEW GRAM POSITIVE COCCI    Culture   Final    TOO YOUNG TO READ Performed at [facility], [address], [place], [place] [address]    Report Status PENDING  Incomplete  Aerobic/Anaerobic Culture w Gram Stain (surgical/deep wound)     Status: None (Preliminary result)   Collection Time: 02/27/23  5:49 PM   Specimen: PATH Bone resection; Tissue  Result Value Ref Range Status   Specimen Description   Final    BONE BONE RESECTION, LEFT HALLUX RESIDUAL BONE Performed at [facility], [address] [place], [place] [address]    Special Requests   Final    NONE Performed at [facility], [address] [place], [place] [address]    Gram Stain NO WBC SEEN NO ORGANISMS SEEN  Final   Culture   Final    NO GROWTH < 12 HOURS Performed at [facility], [address], [place], [place] [address]    Report Status PENDING  Incomplete    Lamar LELON Bucks, MD [facility] [email or web address] 02/28/2023, 2:35 PM

## 2023-03-01 DIAGNOSIS — L089 Local infection of the skin and subcutaneous tissue, unspecified: Secondary | ICD-10-CM | POA: Diagnosis not present

## 2023-03-01 DIAGNOSIS — E11628 Type 2 diabetes mellitus with other skin complications: Secondary | ICD-10-CM | POA: Diagnosis not present

## 2023-03-01 LAB — GLUCOSE, CAPILLARY
Glucose-Capillary: 85 mg/dL (ref 70–99)
Glucose-Capillary: 121 mg/dL — ABNORMAL HIGH (ref 70–99)
Glucose-Capillary: 151 mg/dL — ABNORMAL HIGH (ref 70–99)
Glucose-Capillary: 203 mg/dL — ABNORMAL HIGH (ref 70–99)

## 2023-03-01 NOTE — Anesthesia Postprocedure Evaluation (Signed)
 Anesthesia Post Note  Patient: [name]  Procedure(s) Performed: AMPUTATION TOE (Left: Toe)     Patient location during evaluation: PACU Anesthesia Type: General Level of consciousness: awake and alert Pain management: pain level controlled Vital Signs Assessment: post-procedure vital signs reviewed and stable Respiratory status: spontaneous breathing, nonlabored ventilation and respiratory function stable Cardiovascular status: blood pressure returned to baseline Postop Assessment: no apparent nausea or vomiting Anesthetic complications: no   No notable events documented.        Vertell Row

## 2023-03-01 NOTE — Plan of Care (Signed)
   Problem: Health Behavior/Discharge Planning: Goal: Ability to manage health-related needs will improve Outcome: Progressing

## 2023-03-01 NOTE — Progress Notes (Signed)
 PROGRESS NOTE    [name]  FMW:[id number] DOB: 1970/01/26 DOA: 02/25/2023 PCP: Katrinka Garnette KIDD, MD   Brief Narrative: 53 year old with past medical history significant for CKD stage II, diabetes type 2, hypertension, morbid obesity, chronic mild elevation of liver function test, pituitary adenoma, history of ray amputation of the right foot presents with left great toe swelling and blister that is started 3 weeks prior to admission.  Patient was noted to be febrile, tachycardic tachypneic.  Admitted with sepsis secondary to left great toe osteomyelitis.   Assessment & Plan:   Principal Problem:   Diabetic infection of left foot (HCC) Active Problems:   Acute renal failure superimposed on stage 3a chronic kidney disease (HCC)   Osteomyelitis of great toe of left foot (HCC)   1-Sepsis secondary to Acute osteomyelitis of the left great toe, POA Proteus Bacteremia.  Blood culture positive for Staph Capitis  -Treated with IV fluids.  -Continue with IV antibiotics: Linezolid , Ceftriaxone  and flagyl .  -MRI left Foot: Plantar soft tissue ulceration at the tip of the great toe with sinus tract extending along the inferior aspect of the first distal phalanx. Associated osteomyelitis of the first distal phalanx and probable first IP joint septic arthritis. Complete tear of the flexor hallucis longus tendon at the base of the first distal phalanx. Small 1 cm abscess overlying the distal extensor hallucis longus tendon. 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 -Blood cultures 2/08. No growth  Follow OR culture: p No growth in 2 days.  -ID consulted. Recommend Augmentin  at discharge  AKI on CKD stage II: Previous creatinine baseline 1.4 Cr down to 1.6--1.7--1.5--1.3 Continue with IV fluids.  Improved.   Diabetes type 2, hypoglycemia.  Continue to hold Tresiba .  SSI Monitor.   Chronic transaminates Hold statins.    Hyperlipidemia Holding statins due to transaminases.   Hypertension Holding ramipril /HCTZ due to AKI PR hydralazine .   Morbid obesity Needs life style modification.   Estimated body mass index is 41.41 kg/m as calculated from the following:   Height as of this encounter: 6' (1.829 m).   Weight as of this encounter: 138.5 kg.   DVT prophylaxis: Lovenox  Code Status: Full code Family Communication: care discussed with patient Disposition Plan:  Status is: Inpatient Remains inpatient appropriate because: management of Osteomyelitis.     Consultants:  Podiatry  Procedures:  none Antimicrobials:    Subjective: Doing well, he is eating healthier here.  Objective: Vitals:   03/01/23 0158 03/01/23 0500 03/01/23 0600 03/01/23 0903  BP: 115/72  115/75 131/70  Pulse: 66  67 67  Resp: 15  15 18   Temp: 97.8 F (36.6 C)  97.8 F (36.6 C) 97.7 F (36.5 C)  TempSrc: Oral  Oral Oral  SpO2: 100%  100% 100%  Weight:  (!) 138.5 kg    Height:        Intake/Output Summary (Last 24 hours) at 03/01/2023 1250 Last data filed at 03/01/2023 1000 Gross per 24 hour  Intake 2771.34 ml  Output 2 ml  Net 2769.34 ml   Filed Weights   02/27/23 0642 02/28/23 0500 03/01/23 0500  Weight: (!) 137.5 kg (!) 139.3 kg (!) 138.5 kg    Examination:  General exam: NAD Respiratory system: CTA Cardiovascular system: S 1, S 2 RRR Gastrointestinal system: BS present, soft, nt Extremities: Left foot with dressing.   Data Reviewed: I have personally reviewed following labs and imaging studies  CBC: Recent Labs  Lab 02/25/23 1159 02/25/23 1834  02/26/23 0516 02/27/23 0446 02/28/23 0600  WBC 12.7* 13.2* 8.9 7.1 7.4  NEUTROABS 11.3*  --   --   --   --   HGB 12.5* 12.6* 11.2* 11.0* 11.6*  HCT 40.7 41.1 35.8* 34.7* 38.2*  MCV 83.9 86.0 82.3 82.2 82.2  PLT 368 306 313 293 332   Basic Metabolic Panel: Recent Labs  Lab 02/25/23 1159 02/26/23 0516 02/27/23 0446 02/28/23 0600  NA 138  135 136 135  K 3.6 3.5 3.7 4.2  CL 103 100 103 101  CO2 22 23 26 27   GLUCOSE 124* 84 94 150*  BUN 23* 20 16 15   CREATININE 1.78* 1.61* 1.54* 1.34*  CALCIUM  9.3 8.9 8.8* 9.2   GFR: Estimated Creatinine Clearance: 93 mL/min (A) (by C-G formula based on SCr of 1.34 mg/dL (H)). Liver Function Tests: Recent Labs  Lab 02/25/23 1159 02/26/23 0516  AST 77* 74*  ALT 91* 98*  ALKPHOS 114 99  BILITOT 1.4* 1.4*  PROT 8.6* 7.3  ALBUMIN 3.6 3.0*   No results for input(s): LIPASE, AMYLASE in the last 168 hours. No results for input(s): AMMONIA in the last 168 hours. Coagulation Profile: Recent Labs  Lab 02/25/23 1159  INR 1.0   Cardiac Enzymes: No results for input(s): CKTOTAL, CKMB, CKMBINDEX, TROPONINI in the last 168 hours. BNP (last 3 results) No results for input(s): PROBNP in the last 8760 hours. HbA1C: No results for input(s): HGBA1C in the last 72 hours. CBG: Recent Labs  Lab 02/28/23 1147 02/28/23 1651 02/28/23 2018 03/01/23 0800 03/01/23 1130  GLUCAP 112* 156* 146* 85 121*   Lipid Profile: No results for input(s): CHOL, HDL, LDLCALC, TRIG, CHOLHDL, LDLDIRECT in the last 72 hours. Thyroid Function Tests: No results for input(s): TSH, T4TOTAL, FREET4, T3FREE, THYROIDAB in the last 72 hours. Anemia Panel: No results for input(s): VITAMINB12, FOLATE, FERRITIN, TIBC, IRON, RETICCTPCT in the last 72 hours. Sepsis Labs: Recent Labs  Lab 02/25/23 1211 02/25/23 1500  LATICACIDVEN 1.6 0.8    Recent Results (from the past 240 hours)  Blood Culture ID Panel (Reflexed)     Status: Abnormal   Collection Time: 02/25/23 11:43 AM  Result Value Ref Range Status   Enterococcus faecalis NOT DETECTED NOT DETECTED Final   Enterococcus Faecium NOT DETECTED NOT DETECTED Final   Listeria monocytogenes NOT DETECTED NOT DETECTED Final   Staphylococcus species NOT DETECTED NOT DETECTED Final   Staphylococcus aureus (BCID) NOT  DETECTED NOT DETECTED Final   Staphylococcus epidermidis NOT DETECTED NOT DETECTED Final   Staphylococcus lugdunensis NOT DETECTED NOT DETECTED Final   Streptococcus species NOT DETECTED NOT DETECTED Final   Streptococcus agalactiae NOT DETECTED NOT DETECTED Final   Streptococcus pneumoniae NOT DETECTED NOT DETECTED Final   Streptococcus pyogenes NOT DETECTED NOT DETECTED Final   A.calcoaceticus-baumannii NOT DETECTED NOT DETECTED Final   Bacteroides fragilis NOT DETECTED NOT DETECTED Final   Enterobacterales DETECTED (A) NOT DETECTED Final    Comment: Enterobacterales represent a large order of gram negative bacteria, not a single organism. CRITICAL RESULT CALLED TO, READ BACK BY AND VERIFIED WITH: PHARMD M.LILLISTON AT 1029 ON 02/27/2023 BY T.SAAD.    Enterobacter cloacae complex NOT DETECTED NOT DETECTED Final   Escherichia coli NOT DETECTED NOT DETECTED Final   Klebsiella aerogenes NOT DETECTED NOT DETECTED Final   Klebsiella oxytoca NOT DETECTED NOT DETECTED Final   Klebsiella pneumoniae NOT DETECTED NOT DETECTED Final   Proteus species DETECTED (A) NOT DETECTED Final    Comment: CRITICAL RESULT CALLED  TO, READ BACK BY AND VERIFIED WITH: PHARMD M.LILLISTON AT 1029 ON 02/27/2023 BY T.SAAD.    Salmonella species NOT DETECTED NOT DETECTED Final   Serratia marcescens NOT DETECTED NOT DETECTED Final   Haemophilus influenzae NOT DETECTED NOT DETECTED Final   Neisseria meningitidis NOT DETECTED NOT DETECTED Final   Pseudomonas aeruginosa NOT DETECTED NOT DETECTED Final   Stenotrophomonas maltophilia NOT DETECTED NOT DETECTED Final   Candida albicans NOT DETECTED NOT DETECTED Final   Candida auris NOT DETECTED NOT DETECTED Final   Candida glabrata NOT DETECTED NOT DETECTED Final   Candida krusei NOT DETECTED NOT DETECTED Final   Candida parapsilosis NOT DETECTED NOT DETECTED Final   Candida tropicalis NOT DETECTED NOT DETECTED Final   Cryptococcus neoformans/gattii NOT DETECTED NOT  DETECTED Final   CTX-M ESBL NOT DETECTED NOT DETECTED Final   Carbapenem resistance IMP NOT DETECTED NOT DETECTED Final   Carbapenem resistance KPC NOT DETECTED NOT DETECTED Final   Carbapenem resistance NDM NOT DETECTED NOT DETECTED Final   Carbapenem resist OXA 48 LIKE NOT DETECTED NOT DETECTED Final   Carbapenem resistance VIM NOT DETECTED NOT DETECTED Final    Comment: Performed at [facility], [address], [place], [place] [address]  Culture, blood (Routine x 2)     Status: Abnormal (Preliminary result)   Collection Time: 02/25/23 12:00 PM   Specimen: BLOOD  Result Value Ref Range Status   Specimen Description   Final    BLOOD SITE NOT SPECIFIED Performed at [facility], [address] [place], [place] [address]    Special Requests   Final    BOTTLES DRAWN AEROBIC AND ANAEROBIC Blood Culture adequate volume Performed at [facility], [address], [place], [place] [address]    Culture  Setup Time   Final    GRAM NEGATIVE RODS ANAEROBIC BOTTLE ONLY CRITICAL RESULT CALLED TO, READ BACK BY AND VERIFIED WITH: PHARMD M.LILLISTON AT 1029 ON 02/27/2023 BY T.SAAD.    Culture (A)  Final    PROTEUS MIRABILIS SUSCEPTIBILITIES TO FOLLOW Performed at [facility], [address], [place], [place] [address]    Report Status PENDING  Incomplete   Organism ID, Bacteria PROTEUS MIRABILIS  Final      Susceptibility   Proteus mirabilis - MIC*    AMPICILLIN  <=2 SENSITIVE Sensitive     CEFEPIME  <=0.12 SENSITIVE Sensitive     CEFTAZIDIME <=1 SENSITIVE Sensitive     CEFTRIAXONE  <=0.25 SENSITIVE Sensitive     CIPROFLOXACIN  <=0.25 SENSITIVE Sensitive     GENTAMICIN  <=1 SENSITIVE Sensitive     IMIPENEM 2 SENSITIVE Sensitive     TRIMETH /SULFA  <=20 SENSITIVE Sensitive     AMPICILLIN /SULBACTAM <=2 SENSITIVE Sensitive     PIP/TAZO <=4 SENSITIVE Sensitive ug/mL    * PROTEUS MIRABILIS  Culture, blood (Routine x 2)     Status: Abnormal   Collection Time:  02/25/23 12:00 PM   Specimen: BLOOD  Result Value Ref Range Status   Specimen Description   Final    BLOOD SITE NOT SPECIFIED Performed at [facility], [address], [place], [place] [address]    Special Requests   Final    BOTTLES DRAWN AEROBIC AND ANAEROBIC Blood Culture adequate volume Performed at [facility], [address], [place], [place] [address]    Culture  Setup Time   Final    GRAM POSITIVE COCCI IN CLUSTERS ANAEROBIC BOTTLE ONLY CRITICAL RESULT CALLED TO, READ BACK  BY AND VERIFIED WITH: PHARMD MARY SWAYNE ON 02/26/23 @ 1559 BY DRT    Culture (A)  Final    STAPHYLOCOCCUS CAPITIS THE SIGNIFICANCE OF ISOLATING THIS ORGANISM FROM A SINGLE SET OF BLOOD CULTURES WHEN MULTIPLE SETS ARE DRAWN IS UNCERTAIN. PLEASE NOTIFY THE MICROBIOLOGY DEPARTMENT WITHIN ONE WEEK IF SPECIATION AND SENSITIVITIES ARE REQUIRED. Performed at [facility], [address], [place], [place] [address]    Report Status 02/28/2023 FINAL  Final  Blood Culture ID Panel (Reflexed)     Status: Abnormal   Collection Time: 02/25/23 12:00 PM  Result Value Ref Range Status   Enterococcus faecalis NOT DETECTED NOT DETECTED Final   Enterococcus Faecium NOT DETECTED NOT DETECTED Final   Listeria monocytogenes NOT DETECTED NOT DETECTED Final   Staphylococcus species DETECTED (A) NOT DETECTED Final    Comment: CRITICAL RESULT CALLED TO, READ BACK BY AND VERIFIED WITH: PHARMD MARY SWAYNE ON 02/26/23 @ 1559 BY DRT    Staphylococcus aureus (BCID) NOT DETECTED NOT DETECTED Final   Staphylococcus epidermidis NOT DETECTED NOT DETECTED Final   Staphylococcus lugdunensis NOT DETECTED NOT DETECTED Final   Streptococcus species NOT DETECTED NOT DETECTED Final   Streptococcus agalactiae NOT DETECTED NOT DETECTED Final   Streptococcus pneumoniae NOT DETECTED NOT DETECTED Final   Streptococcus pyogenes NOT DETECTED NOT DETECTED Final   A.calcoaceticus-baumannii NOT DETECTED NOT DETECTED Final    Bacteroides fragilis NOT DETECTED NOT DETECTED Final   Enterobacterales NOT DETECTED NOT DETECTED Final   Enterobacter cloacae complex NOT DETECTED NOT DETECTED Final   Escherichia coli NOT DETECTED NOT DETECTED Final   Klebsiella aerogenes NOT DETECTED NOT DETECTED Final   Klebsiella oxytoca NOT DETECTED NOT DETECTED Final   Klebsiella pneumoniae NOT DETECTED NOT DETECTED Final   Proteus species NOT DETECTED NOT DETECTED Final   Salmonella species NOT DETECTED NOT DETECTED Final   Serratia marcescens NOT DETECTED NOT DETECTED Final   Haemophilus influenzae NOT DETECTED NOT DETECTED Final   Neisseria meningitidis NOT DETECTED NOT DETECTED Final   Pseudomonas aeruginosa NOT DETECTED NOT DETECTED Final   Stenotrophomonas maltophilia NOT DETECTED NOT DETECTED Final   Candida albicans NOT DETECTED NOT DETECTED Final   Candida auris NOT DETECTED NOT DETECTED Final   Candida glabrata NOT DETECTED NOT DETECTED Final   Candida krusei NOT DETECTED NOT DETECTED Final   Candida parapsilosis NOT DETECTED NOT DETECTED Final   Candida tropicalis NOT DETECTED NOT DETECTED Final   Cryptococcus neoformans/gattii NOT DETECTED NOT DETECTED Final    Comment: Performed at [facility], [address], [place], [place] [address]  Surgical pcr screen     Status: None   Collection Time: 02/26/23  7:37 PM   Specimen: Nasal Mucosa; Nasal Swab  Result Value Ref Range Status   MRSA, PCR NEGATIVE NEGATIVE Final   Staphylococcus aureus NEGATIVE NEGATIVE Final    Comment: (NOTE) The Xpert SA Assay (FDA approved for NASAL specimens in patients 66 years of age and older), is one component of a comprehensive surveillance program. It is not intended to diagnose infection nor to guide or monitor treatment. Performed at [facility], [address], [place], [place] [address]   Aerobic/Anaerobic Culture w Gram Stain (surgical/deep wound)     Status: None (Preliminary result)   Collection  Time: 02/27/23  5:47 PM   Specimen: Path fluid; Tissue  Result Value Ref Range Status   Specimen Description   Final    FLUID Performed at [facility], [address], [place], [place] [address]    Special Requests   Final    NONE Performed at [facility], [address] [place], [place] [address]    Gram Stain   Final    RARE WBC PRESENT, PREDOMINANTLY PMN FEW GRAM POSITIVE COCCI    Culture   Final    CULTURE REINCUBATED FOR BETTER GROWTH Performed at [facility], [address] [place], [place] [address]    Report Status PENDING  Incomplete  Aerobic/Anaerobic Culture w Gram Stain (surgical/deep wound)     Status: None (Preliminary result)   Collection Time: 02/27/23  5:49 PM   Specimen: PATH Bone resection; Tissue  Result Value Ref Range Status   Specimen Description   Final    BONE BONE RESECTION, LEFT HALLUX RESIDUAL BONE Performed at [facility], [address] [place], [place] [address]    Special Requests   Final    NONE Performed at [facility], [address], [place], [place] [address]    Gram Stain NO WBC SEEN NO ORGANISMS SEEN   Final   Culture   Final    NO GROWTH 2 DAYS Performed at [facility], [address] [place], [place] [address]    Report Status PENDING  Incomplete  Culture, blood (Routine X 2) w Reflex to ID Panel     Status: None (Preliminary result)   Collection Time: 02/28/23  6:00 AM   Specimen: BLOOD RIGHT ARM  Result Value Ref Range Status   Specimen Description   Final    BLOOD RIGHT ARM Performed at [facility], [address] [place], [place] [address]    Special Requests   Final    BOTTLES DRAWN AEROBIC AND ANAEROBIC Blood Culture results may not be optimal due to an inadequate volume of blood received in culture bottles Performed at [facility], [address] [place], [place] [address]    Culture   Final     NO GROWTH 1 DAY Performed at [facility], [address], [place], [place] [address]    Report Status PENDING  Incomplete  Culture, blood (Routine X 2) w Reflex to ID Panel     Status: None (Preliminary result)   Collection Time: 02/28/23  6:00 AM   Specimen: BLOOD RIGHT HAND  Result Value Ref Range Status   Specimen Description   Final    BLOOD RIGHT HAND Performed at [facility], [address] [place], [place] [address]    Special Requests   Final    BOTTLES DRAWN AEROBIC AND ANAEROBIC Blood Culture results may not be optimal due to an inadequate volume of blood received in culture bottles Performed at [facility], [address], [place], [place] [address]    Culture   Final    NO GROWTH 1 DAY Performed at [facility], [address], [place], [place] [address]    Report Status PENDING  Incomplete         Radiology Studies: DG Foot 2 Views Left Result Date: 02/27/2023 CLINICAL DATA:  Osteomyelitis, left great toe amputation EXAM: LEFT FOOT - 2 VIEW COMPARISON:  02/25/2023 FINDINGS: Frontal and lateral views of the left foot are obtained. Interval amputation of the first digit at the level of the distal margin first proximal phalanx. Prior postsurgical changes of the second and fifth digits again noted. No acute fractures.  Soft tissue swelling throughout the forefoot and surgical bed of the first digit. IMPRESSION: 1. Postsurgical changes from left first digit amputation. Soft tissue swelling throughout the forefoot and surgical bed. Electronically Signed   By: Ozell Daring M.D.   On: 02/27/2023 21:43        Scheduled Meds:  aspirin  EC  81 mg Oral Daily   enoxaparin  (LOVENOX ) injection  70 mg Subcutaneous Q24H   fenofibrate   54 mg Oral Daily   insulin  aspart  0-15 Units Subcutaneous TID WC   insulin  aspart  0-5 Units Subcutaneous QHS   omega-3 acid ethyl esters  1 g Oral Daily   polyethylene glycol  17 g Oral Daily   sodium  chloride flush  3 mL Intravenous Q12H   Continuous Infusions:  cefTRIAXone  (ROCEPHIN )  IV 2 g (02/28/23 1307)   linezolid  (ZYVOX ) IV Stopped (03/01/23 1015)   metronidazole  500 mg (03/01/23 1205)     LOS: 4 days    Time spent: 35 Minutes    Daylene Vandenbosch A Felicite Zeimet, MD [facility]   If 7PM-7AM, please contact night-coverage [email or web address]  03/01/2023, 12:50 PM

## 2023-03-01 NOTE — Evaluation (Signed)
 Physical Therapy One Time Evaluation and Discharge from Acute PT Patient Details Name: [name] MRN: [id number] DOB: 27-Aug-1970 Today's Date: 03/01/2023  History of Present Illness  Pt is a 53 year old male s/p left partial hallux amputation on 02/27/23.  PMH: diabetes mellitus with diabetic neuropathy, and multiple bil foot surgeries including toe amputations  Clinical Impression  Patient evaluated by Physical Therapy with no further acute PT needs identified. All education has been completed and the patient has no further questions.  Pt familiar with education from previous toe amputations.  Pt ambulated in hallway and performed a couple steps.  Pt agreeable to no further PT needs and up ad lib in hospital room. No further follow-up Physical Therapy or equipment needs. PT is signing off. Thank you for this referral.          If plan is discharge home, recommend the following:     Can travel by private vehicle        Equipment Recommendations None recommended by PT  Recommendations for Other Services       Functional Status Assessment Patient has not had a recent decline in their functional status     Precautions / Restrictions Precautions Precautions: None Restrictions Weight Bearing Restrictions Per Provider Order: Yes LLE Weight Bearing Per Provider Order: Partial weight bearing Other Position/Activity Restrictions: heel weight bearing with post op shoe      Mobility  Bed Mobility Overal bed mobility: Independent                  Transfers Overall transfer level: Independent                      Ambulation/Gait Ambulation/Gait assistance: Supervision Gait Distance (Feet): 350 Feet Assistive device: None Gait Pattern/deviations: Step-through pattern, Decreased stride length       General Gait Details: cues for heel weight bearing only, pt declined using assistive device, encouraged use of crutch or RW at home to assist with off loading  for healing; no instability or LOB observed  Stairs Stairs: Yes Stairs assistance: Supervision Stair Management: Step to pattern, Forwards, One rail Right Number of Stairs: 2 General stair comments: pt reports he can hold door jam so utilized rail; cue for heel weight bearing and sequence  Wheelchair Mobility     Tilt Bed    Modified Rankin (Stroke Patients Only)       Balance Overall balance assessment: No apparent balance deficits (not formally assessed)                                           Pertinent Vitals/Pain Pain Assessment Pain Assessment: No/denies pain    Home Living Family/patient expects to be discharged to:: Private residence Living Arrangements: Spouse/significant other Available Help at Discharge: Family Type of Home: House Home Access: Stairs to enter Entrance Stairs-Rails: None Entrance Stairs-Number of Steps: 2   Home Layout: One level Home Equipment: Agricultural Consultant (2 wheels);Cane - single point;Crutches      Prior Function Prior Level of Function : Independent/Modified Independent                     Extremity/Trunk Assessment   Upper Extremity Assessment Upper Extremity Assessment: Overall WFL for tasks assessed    Lower Extremity Assessment Lower Extremity Assessment: Overall WFL for tasks assessed  Communication   Communication Communication: No apparent difficulties  Cognition Arousal: Alert Behavior During Therapy: WFL for tasks assessed/performed Overall Cognitive Status: Within Functional Limits for tasks assessed                                          General Comments      Exercises     Assessment/Plan    PT Assessment Patient does not need any further PT services  PT Problem List         PT Treatment Interventions      PT Goals (Current goals can be found in the Care Plan section)  Acute Rehab PT Goals PT Goal Formulation: All assessment and education  complete, DC therapy    Frequency       Co-evaluation               AM-PAC PT 6 Clicks Mobility  Outcome Measure Help needed turning from your back to your side while in a flat bed without using bedrails?: None Help needed moving from lying on your back to sitting on the side of a flat bed without using bedrails?: None Help needed moving to and from a bed to a chair (including a wheelchair)?: None Help needed standing up from a chair using your arms (e.g., wheelchair or bedside chair)?: None Help needed to walk in hospital room?: None Help needed climbing 3-5 steps with a railing? : A Little 6 Click Score: 23    End of Session   Activity Tolerance: Patient tolerated treatment well Patient left: Other (comment) (up ad lib in room)   PT Visit Diagnosis: Difficulty in walking, not elsewhere classified (R26.2)    Time: 9045-8993 PT Time Calculation (min) (ACUTE ONLY): 12 min   Charges:   PT Evaluation $PT Eval Low Complexity: 1 Low   PT General Charges $$ ACUTE PT VISIT: 1 Visit    Tari PT, DPT Physical Therapist Acute Rehabilitation Services Office: [phone]   Tari CROME Payson 03/01/2023, 12:56 PM

## 2023-03-02 ENCOUNTER — Encounter (HOSPITAL_COMMUNITY): Payer: Self-pay | Admitting: Podiatry

## 2023-03-02 DIAGNOSIS — L089 Local infection of the skin and subcutaneous tissue, unspecified: Secondary | ICD-10-CM | POA: Diagnosis not present

## 2023-03-02 DIAGNOSIS — E11628 Type 2 diabetes mellitus with other skin complications: Secondary | ICD-10-CM | POA: Diagnosis not present

## 2023-03-02 LAB — CULTURE, BLOOD (ROUTINE X 2): Special Requests: ADEQUATE

## 2023-03-02 LAB — GLUCOSE, CAPILLARY: Glucose-Capillary: 107 mg/dL — ABNORMAL HIGH (ref 70–99)

## 2023-03-02 MED ORDER — DOXYCYCLINE HYCLATE 100 MG PO TABS: 100.0000 mg | ORAL_TABLET | Freq: Two times a day (BID) | ORAL | 0 refills | Status: AC

## 2023-03-02 MED ORDER — DOXYCYCLINE HYCLATE 100 MG PO TABS
100.0000 mg | ORAL_TABLET | Freq: Two times a day (BID) | ORAL | Status: DC
  Administered 2023-03-02: 100 mg via ORAL
  Filled 2023-03-02: qty 1

## 2023-03-02 MED ORDER — TRESIBA FLEXTOUCH 200 UNIT/ML ~~LOC~~ SOPN
10.0000 [IU] | PEN_INJECTOR | SUBCUTANEOUS | 3 refills | Status: DC
Start: 2023-03-02 — End: 2023-09-30

## 2023-03-02 MED ORDER — NOVOLOG FLEXPEN 100 UNIT/ML ~~LOC~~ SOPN: 3.0000 [IU] | PEN_INJECTOR | Freq: Two times a day (BID) | SUBCUTANEOUS | 11 refills | Status: DC

## 2023-03-02 MED ORDER — AMOXICILLIN-POT CLAVULANATE 875-125 MG PO TABS: 1.0000 | ORAL_TABLET | Freq: Two times a day (BID) | ORAL | 0 refills | Status: AC

## 2023-03-02 NOTE — Discharge Summary (Addendum)
 Physician Discharge Summary   Patient: [name] MRN: [id number] DOB: 06/26/1970  Admit date:     02/25/2023  Discharge date: 03/02/23  Discharge Physician: Danette Duos   PCP: Almira Jaeger, MD   Recommendations at discharge:    Needs follow up with Podiatry in 1 week Patient will keep dressing clean dry and intact. He may be heel weight bearing in surgical shoe.  Needs follow up for BP: holding hydrochlorothiazide  and ACE due to AKI.   Discharge Diagnoses: Principal Problem:   Diabetic infection of left foot (HCC) Active Problems:   Acute renal failure superimposed on stage 3a chronic kidney disease (HCC)   Osteomyelitis of great toe of left foot (HCC)  Resolved Problems:   * No resolved hospital problems. *  Hospital Course: 53 year old with past medical history significant for CKD stage II, diabetes type 2, hypertension, morbid obesity, chronic mild elevation of liver function test, pituitary adenoma, history of ray amputation of the right foot presents with left great toe swelling and blister that is started 3 weeks prior to admission.  Patient was noted to be febrile, tachycardic tachypneic.  Admitted with sepsis secondary to left great toe osteomyelitis.    Assessment and Plan:  1-Sepsis secondary to Acute osteomyelitis of the left great toe, POA Proteus Bacteremia.  Blood culture positive for Staph Capitis  -Treated with IV fluids.  -Continue with IV antibiotics: Linezolid , Ceftriaxone  and flagyl .  -MRI left Foot: Plantar soft tissue ulceration at the tip of the great toe with sinus tract extending along the inferior aspect of the first distal phalanx. Associated osteomyelitis of the first distal phalanx and probable first IP joint septic arthritis. Complete tear of the flexor hallucis longus tendon at the base of the first distal phalanx. Small 1 cm abscess overlying the distal extensor hallucis longus tendon. 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 -Blood cultures 2/08. No growth  Follow OR culture: p No growth in 2 days.  -ID consulted. Recommend Augmentin  at discharge for 23 weeks.  -Stable for discharge, culture remain negative -Culture came back with enterococcus faecalis and Staph. Discussed with ID discharge on Augmentin  and Doxy for 14 days. ID will follow final report.   AKI on CKD stage II: Previous creatinine baseline 1.4 Cr down to 1.6--1.7--1.5--1.3 Continue with IV fluids.  Improved.   hold Lisinopril, hydrochlorothiazide  at discharge.   Diabetes type 2, hypoglycemia.  Resume lower home dose Tresiba .  SSI Monitor.    Chronic transaminates Resume statins at discharge.    Hyperlipidemia Holding statins due to transaminases.    Hypertension Holding ramipril /HCTZ due to AKI PR hydralazine .    Morbid obesity Needs life style modification.    Estimated body mass index is 41.41 kg/m as calculated from the following:   Height as of this encounter: 6' (1.829 m).   Weight as of this encounter: 138.5 kg.     DVT prophylaxis: Lovenox  Code Status: Full code Family Communication: care discussed with patient Disposition Plan:  Status is: Inpatient Remains inpatient appropriate because: management of Osteomyelitis.            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  Disposition: Home Diet recommendation:  Discharge Diet Orders (From admission, onward)     Start     Ordered   03/02/23 0000  Diet Carb Modified        03/02/23 0915           Carb modified diet  DISCHARGE MEDICATION: Allergies as of 03/02/2023   No Known Allergies      Medication List     STOP taking these medications    hydrochlorothiazide  12.5 MG capsule Commonly known as: MICROZIDE    naproxen  sodium 220 MG tablet Commonly known as: ALEVE    ramipril  5 MG capsule Commonly known as: ALTACE        TAKE these  medications    amoxicillin -clavulanate 875-125 MG tablet Commonly known as: AUGMENTIN  Take 1 tablet by mouth 2 (two) times daily for 14 days.   ascorbic acid  500 MG tablet Commonly known as: VITAMIN C  Take 500 mg by mouth daily.   aspirin  EC 81 MG tablet Take 81 mg by mouth daily.   atorvastatin  80 MG tablet Commonly known as: LIPITOR TAKE 1 TABLET BY MOUTH EVERY DAY   fenofibrate  48 MG tablet Commonly known as: TRICOR  TAKE 1 TABLET BY MOUTH EVERY DAY   Fish Oil 1360 MG Caps Take 1,360 mg by mouth daily.   IRON PO Take 1 tablet by mouth daily.   multivitamin with minerals Tabs tablet Take 1 tablet by mouth daily.   NovoLOG  FlexPen 100 UNIT/ML FlexPen Generic drug: insulin  aspart Inject 3 Units into the skin 2 (two) times daily. What changed: See the new instructions.   pyridoxine  100 MG tablet Commonly known as: B-6 Take 100 mg by mouth daily.   tirzepatide  5 MG/0.5ML Pen Commonly known as: MOUNJARO  Inject 5 mg into the skin once a week.   Tresiba  FlexTouch 200 UNIT/ML FlexTouch Pen Generic drug: insulin  degludec Inject 10 Units into the skin daily. What changed: See the new instructions.   Vitamin B-12 5000 MCG Subl Place 5,000 mcg under the tongue daily.   ZINC  PO Take 1 tablet by mouth daily.               Discharge Care Instructions  (From admission, onward)           Start     Ordered   03/02/23 0000  Discharge wound care:       Comments: Recommendation per podiatry   03/02/23 0915            Discharge Exam: Filed Weights   02/27/23 0642 02/28/23 0500 03/01/23 0500  Weight: (!) 137.5 kg (!) 139.3 kg (!) 138.5 kg  General; NAD  Condition at discharge: stable  The results of significant diagnostics from this hospitalization (including imaging, microbiology, ancillary and laboratory) are listed below for reference.   Imaging Studies: DG Foot 2 Views Left Result Date: 02/27/2023 CLINICAL DATA:  Osteomyelitis, left great toe  amputation EXAM: LEFT FOOT - 2 VIEW COMPARISON:  02/25/2023 FINDINGS: Frontal and lateral views of the left foot are obtained. Interval amputation of the first digit at the level of the distal margin first proximal phalanx. Prior postsurgical changes of the second and fifth digits again noted. No acute fractures. Soft tissue swelling throughout the forefoot and surgical bed of the first digit. IMPRESSION: 1. Postsurgical changes from left first digit amputation. Soft tissue swelling throughout the forefoot and surgical bed. Electronically Signed   By: Bobbye Burrow M.D.   On: 02/27/2023 21:43   MR FOOT LEFT WO CONTRAST Result Date: 02/26/2023 CLINICAL DATA:  Worsening great toe wound. EXAM: MRI OF THE LEFT FOOT WITHOUT CONTRAST TECHNIQUE: Multiplanar, multisequence MR imaging of the left forefoot was performed. No intravenous contrast was administered. COMPARISON:  Left foot x-rays from same day. FINDINGS: Bones/Joint/Cartilage Abnormal marrow edema with corresponding decreased T1 marrow signal  involving the first distal phalanx, consistent with osteomyelitis. Prior partial amputation of the second toe involving the middle and distal phalanges. Prior resection of the distal fifth metatarsal. No fracture or dislocation. Mild moderate degenerative changes of the first MTP joint. Small first IP joint effusion. Ligaments Collateral ligaments are intact. Muscles and Tendons The flexor hallucis longus tendon is completely torn at the base of the first distal phalanx. Minimal retraction. Remaining flexor and extensor are intact. Small 1 cm fluid collection overlying the distal extensor hallucis longus tendon. Increased T2 signal within and atrophy of the intrinsic muscles of the forefoot, nonspecific, but likely related to diabetic muscle changes. Soft tissue Plantar soft tissue ulceration at the tip of the great toe with sinus tract extending along the inferior aspect of the first distal phalanx (series 8, image 11).  Forefoot and great toe soft tissue swelling. No soft tissue mass. IMPRESSION: 1. Plantar soft tissue ulceration at the tip of the great toe with sinus tract extending along the inferior aspect of the first distal phalanx. Associated osteomyelitis of the first distal phalanx and probable first IP joint septic arthritis. 2. Complete tear of the flexor hallucis longus tendon at the base of the first distal phalanx. 3. Small 1 cm abscess overlying the distal extensor hallucis longus tendon. Electronically Signed   By: Aleta Anda M.D.   On: 02/26/2023 08:27   DG Chest 2 View Result Date: 02/25/2023 CLINICAL DATA:  Suspected sepsis. EXAM: CHEST - 2 VIEW COMPARISON:  08/13/2021 FINDINGS: Low lung volumes. The heart size and mediastinal contours are within normal limits. Both lungs are clear. The visualized skeletal structures are unremarkable. IMPRESSION: No active cardiopulmonary disease. Electronically Signed   By: Rozell Cornet M.D.   On: 02/25/2023 14:36   DG Foot Complete Left Result Date: 02/25/2023 CLINICAL DATA:  Great toe infection.  Assess for osteomyelitis. EXAM: LEFT FOOT - COMPLETE 3+ VIEW COMPARISON:  Left foot radiographs 07/22/2019 and 06/21/2019 FINDINGS: Redemonstration of amputation of the second toe middle and distal phalanges and the distal aspect of the fifth metatarsal. There is moderate great toe soft tissue swelling. Irregularity of the distal great toe soft tissues with linear lucency tracking from the distal plantar aspect of the great toe towards the distal aspect of the distal phalanx suspicious for an ulcer and sinus tract containing air. There is new mild cortical erosion at the distal aspect of the distal phalanx with a curvilinear lucency that may represent bone fragmentation. Mild-to-moderate plantar calcaneal heel spur with additional 18 mm length mineralization overlying the proximal plantar aponeurosis on lateral view. Mild chronic enthesopathic change at the Achilles  insertion on the calcaneus. IMPRESSION: 1. Moderate great toe soft tissue swelling with irregularity of the distal great toe soft tissues suspicious for an ulcer and sinus tract. 2. New mild cortical erosion at the distal aspect of the great toe distal phalanx with a curvilinear lucency that may represent bone fragmentation. This is suspicious for acute osteomyelitis. 3. Redemonstration of amputation of the second toe middle and distal phalanges and the distal aspect of the fifth metatarsal. Electronically Signed   By: Bertina Broccoli M.D.   On: 02/25/2023 13:57    Microbiology: Results for orders placed or performed during the hospital encounter of 02/25/23  Blood Culture ID Panel (Reflexed)     Status: Abnormal   Collection Time: 02/25/23 11:43 AM  Result Value Ref Range Status   Enterococcus faecalis NOT DETECTED NOT DETECTED Final   Enterococcus Faecium NOT DETECTED NOT  DETECTED Final   Listeria monocytogenes NOT DETECTED NOT DETECTED Final   Staphylococcus species NOT DETECTED NOT DETECTED Final   Staphylococcus aureus (BCID) NOT DETECTED NOT DETECTED Final   Staphylococcus epidermidis NOT DETECTED NOT DETECTED Final   Staphylococcus lugdunensis NOT DETECTED NOT DETECTED Final   Streptococcus species NOT DETECTED NOT DETECTED Final   Streptococcus agalactiae NOT DETECTED NOT DETECTED Final   Streptococcus pneumoniae NOT DETECTED NOT DETECTED Final   Streptococcus pyogenes NOT DETECTED NOT DETECTED Final   A.calcoaceticus-baumannii NOT DETECTED NOT DETECTED Final   Bacteroides fragilis NOT DETECTED NOT DETECTED Final   Enterobacterales DETECTED (A) NOT DETECTED Final    Comment: Enterobacterales represent a large order of gram negative bacteria, not a single organism. CRITICAL RESULT CALLED TO, READ BACK BY AND VERIFIED WITH: PHARMD M.LILLISTON AT 1029 ON 02/27/2023 BY T.SAAD.    Enterobacter cloacae complex NOT DETECTED NOT DETECTED Final   Escherichia coli NOT DETECTED NOT DETECTED  Final   Klebsiella aerogenes NOT DETECTED NOT DETECTED Final   Klebsiella oxytoca NOT DETECTED NOT DETECTED Final   Klebsiella pneumoniae NOT DETECTED NOT DETECTED Final   Proteus species DETECTED (A) NOT DETECTED Final    Comment: CRITICAL RESULT CALLED TO, READ BACK BY AND VERIFIED WITH: PHARMD M.LILLISTON AT 1029 ON 02/27/2023 BY T.SAAD.    Salmonella species NOT DETECTED NOT DETECTED Final   Serratia marcescens NOT DETECTED NOT DETECTED Final   Haemophilus influenzae NOT DETECTED NOT DETECTED Final   Neisseria meningitidis NOT DETECTED NOT DETECTED Final   Pseudomonas aeruginosa NOT DETECTED NOT DETECTED Final   Stenotrophomonas maltophilia NOT DETECTED NOT DETECTED Final   Candida albicans NOT DETECTED NOT DETECTED Final   Candida auris NOT DETECTED NOT DETECTED Final   Candida glabrata NOT DETECTED NOT DETECTED Final   Candida krusei NOT DETECTED NOT DETECTED Final   Candida parapsilosis NOT DETECTED NOT DETECTED Final   Candida tropicalis NOT DETECTED NOT DETECTED Final   Cryptococcus neoformans/gattii NOT DETECTED NOT DETECTED Final   CTX-M ESBL NOT DETECTED NOT DETECTED Final   Carbapenem resistance IMP NOT DETECTED NOT DETECTED Final   Carbapenem resistance KPC NOT DETECTED NOT DETECTED Final   Carbapenem resistance NDM NOT DETECTED NOT DETECTED Final   Carbapenem resist OXA 48 LIKE NOT DETECTED NOT DETECTED Final   Carbapenem resistance VIM NOT DETECTED NOT DETECTED Final    Comment: Performed at [facility], [address], [place], [place] [address]  Culture, blood (Routine x 2)     Status: Abnormal (Preliminary result)   Collection Time: 02/25/23 12:00 PM   Specimen: BLOOD  Result Value Ref Range Status   Specimen Description   Final    BLOOD SITE NOT SPECIFIED Performed at [facility], [address] [place], [place] [address]    Special Requests   Final    BOTTLES DRAWN AEROBIC AND ANAEROBIC Blood Culture adequate volume Performed at [facility], [address], [place], [place] [address]    Culture  Setup Time   Final    GRAM NEGATIVE RODS ANAEROBIC BOTTLE ONLY CRITICAL RESULT CALLED TO, READ BACK BY AND VERIFIED WITH: PHARMD M.LILLISTON AT 1029 ON 02/27/2023 BY T.SAAD.    Culture (A)  Final    PROTEUS MIRABILIS SUSCEPTIBILITIES TO FOLLOW Performed at [facility], [address], [place], [place] [address]    Report Status PENDING  Incomplete   Organism ID, Bacteria PROTEUS MIRABILIS  Final      Susceptibility  Proteus mirabilis - MIC*    AMPICILLIN  <=2 SENSITIVE Sensitive     CEFEPIME  <=0.12 SENSITIVE Sensitive     CEFTAZIDIME <=1 SENSITIVE Sensitive     CEFTRIAXONE  <=0.25 SENSITIVE Sensitive     CIPROFLOXACIN  <=0.25 SENSITIVE Sensitive     GENTAMICIN  <=1 SENSITIVE Sensitive     IMIPENEM 2 SENSITIVE Sensitive     TRIMETH /SULFA  <=20 SENSITIVE Sensitive     AMPICILLIN /SULBACTAM <=2 SENSITIVE Sensitive     PIP/TAZO <=4 SENSITIVE Sensitive ug/mL    * PROTEUS MIRABILIS  Culture, blood (Routine x 2)     Status: Abnormal   Collection Time: 02/25/23 12:00 PM   Specimen: BLOOD  Result Value Ref Range Status   Specimen Description   Final    BLOOD SITE NOT SPECIFIED Performed at [facility], [address], [place], [place] [address]    Special Requests   Final    BOTTLES DRAWN AEROBIC AND ANAEROBIC Blood Culture adequate volume Performed at [facility], [address] [place], [place] [address]    Culture  Setup Time   Final    GRAM POSITIVE COCCI IN CLUSTERS ANAEROBIC BOTTLE ONLY CRITICAL RESULT CALLED TO, READ BACK BY AND VERIFIED WITH: PHARMD MARY SWAYNE ON 02/26/23 @ 1559 BY DRT    Culture (A)  Final    STAPHYLOCOCCUS CAPITIS THE SIGNIFICANCE OF ISOLATING THIS ORGANISM FROM A SINGLE SET OF BLOOD CULTURES WHEN MULTIPLE SETS ARE DRAWN IS UNCERTAIN. PLEASE NOTIFY THE MICROBIOLOGY DEPARTMENT WITHIN ONE WEEK IF SPECIATION AND SENSITIVITIES ARE REQUIRED. Performed at  [facility], [address], [place], [place] [address]    Report Status 02/28/2023 FINAL  Final  Blood Culture ID Panel (Reflexed)     Status: Abnormal   Collection Time: 02/25/23 12:00 PM  Result Value Ref Range Status   Enterococcus faecalis NOT DETECTED NOT DETECTED Final   Enterococcus Faecium NOT DETECTED NOT DETECTED Final   Listeria monocytogenes NOT DETECTED NOT DETECTED Final   Staphylococcus species DETECTED (A) NOT DETECTED Final    Comment: CRITICAL RESULT CALLED TO, READ BACK BY AND VERIFIED WITH: PHARMD MARY SWAYNE ON 02/26/23 @ 1559 BY DRT    Staphylococcus aureus (BCID) NOT DETECTED NOT DETECTED Final   Staphylococcus epidermidis NOT DETECTED NOT DETECTED Final   Staphylococcus lugdunensis NOT DETECTED NOT DETECTED Final   Streptococcus species NOT DETECTED NOT DETECTED Final   Streptococcus agalactiae NOT DETECTED NOT DETECTED Final   Streptococcus pneumoniae NOT DETECTED NOT DETECTED Final   Streptococcus pyogenes NOT DETECTED NOT DETECTED Final   A.calcoaceticus-baumannii NOT DETECTED NOT DETECTED Final   Bacteroides fragilis NOT DETECTED NOT DETECTED Final   Enterobacterales NOT DETECTED NOT DETECTED Final   Enterobacter cloacae complex NOT DETECTED NOT DETECTED Final   Escherichia coli NOT DETECTED NOT DETECTED Final   Klebsiella aerogenes NOT DETECTED NOT DETECTED Final   Klebsiella oxytoca NOT DETECTED NOT DETECTED Final   Klebsiella pneumoniae NOT DETECTED NOT DETECTED Final   Proteus species NOT DETECTED NOT DETECTED Final   Salmonella species NOT DETECTED NOT DETECTED Final   Serratia marcescens NOT DETECTED NOT DETECTED Final   Haemophilus influenzae NOT DETECTED NOT DETECTED Final   Neisseria meningitidis NOT DETECTED NOT DETECTED Final   Pseudomonas aeruginosa NOT DETECTED NOT DETECTED Final   Stenotrophomonas maltophilia NOT DETECTED NOT DETECTED Final   Candida albicans NOT DETECTED NOT DETECTED Final   Candida auris NOT DETECTED NOT DETECTED  Final   Candida glabrata NOT DETECTED NOT DETECTED Final  Candida krusei NOT DETECTED NOT DETECTED Final   Candida parapsilosis NOT DETECTED NOT DETECTED Final   Candida tropicalis NOT DETECTED NOT DETECTED Final   Cryptococcus neoformans/gattii NOT DETECTED NOT DETECTED Final    Comment: Performed at [facility], [address], [place], [place] [address]  Surgical pcr screen     Status: None   Collection Time: 02/26/23  7:37 PM   Specimen: Nasal Mucosa; Nasal Swab  Result Value Ref Range Status   MRSA, PCR NEGATIVE NEGATIVE Final   Staphylococcus aureus NEGATIVE NEGATIVE Final    Comment: (NOTE) The Xpert SA Assay (FDA approved for NASAL specimens in patients 68 years of age and older), is one component of a comprehensive surveillance program. It is not intended to diagnose infection nor to guide or monitor treatment. Performed at [facility], [address], [place], [place] [address]   Aerobic/Anaerobic Culture w Gram Stain (surgical/deep wound)     Status: None (Preliminary result)   Collection Time: 02/27/23  5:47 PM   Specimen: Path fluid; Tissue  Result Value Ref Range Status   Specimen Description   Final    FLUID Performed at [facility], [address] [place], [place] [address]    Special Requests   Final    NONE Performed at [facility], [address], [place], [place] [address]    Gram Stain   Final    RARE WBC PRESENT, PREDOMINANTLY PMN FEW GRAM POSITIVE COCCI    Culture   Final    ABUNDANT ENTEROCOCCUS FAECALIS SUSCEPTIBILITIES TO FOLLOW MODERATE STAPHYLOCOCCUS AUREUS FEW PROTEUS MIRABILIS CULTURE REINCUBATED FOR BETTER GROWTH HOLDING FOR POSSIBLE ANAEROBE Performed at [facility], [address] [place], [place] [address]    Report Status PENDING  Incomplete  Aerobic/Anaerobic Culture w Gram Stain (surgical/deep wound)     Status: None (Preliminary result)   Collection Time:  02/27/23  5:49 PM   Specimen: PATH Bone resection; Tissue  Result Value Ref Range Status   Specimen Description   Final    BONE BONE RESECTION, LEFT HALLUX RESIDUAL BONE Performed at [facility] [address] [place], [place] [address]    Special Requests   Final    NONE Performed at [facility], [address], [place], [place] [address]    Gram Stain NO WBC SEEN NO ORGANISMS SEEN   Final   Culture   Final    NO GROWTH 2 DAYS NO ANAEROBES ISOLATED; CULTURE IN PROGRESS FOR 5 DAYS Performed at [facility], [address] [place], [place] [address]    Report Status PENDING  Incomplete  Culture, blood (Routine X 2) w Reflex to ID Panel     Status: None (Preliminary result)   Collection Time: 02/28/23  6:00 AM   Specimen: BLOOD RIGHT ARM  Result Value Ref Range Status   Specimen Description   Final    BLOOD RIGHT ARM Performed at [facility], [address], [place], [place] [address]    Special Requests   Final    BOTTLES DRAWN AEROBIC AND ANAEROBIC Blood Culture results may not be optimal due to an inadequate volume of blood received in culture bottles Performed at [facility], [address], [place], [place] [address]    Culture   Final    NO GROWTH 1 DAY Performed at [facility], [address] [place], [place] [address]  Report Status PENDING  Incomplete  Culture, blood (Routine X 2) w Reflex to ID Panel     Status: None (Preliminary result)   Collection Time: 02/28/23  6:00 AM   Specimen: BLOOD RIGHT HAND  Result Value Ref Range Status   Specimen Description   Final    BLOOD RIGHT HAND Performed at [facility], [address] [place] [address], [place], [place] [address]    Special Requests   Final    BOTTLES DRAWN AEROBIC AND ANAEROBIC Blood Culture results may not be optimal due to an inadequate volume of blood received in culture bottles Performed at [facility], [address] [place], [place] [address]    Culture   Final    NO GROWTH 1 DAY Performed at [facility], [address] [place], [place] [address]    Report Status PENDING  Incomplete    Labs: CBC: Recent Labs  Lab 02/25/23 1159 02/25/23 1834 02/26/23 0516 02/27/23 0446 02/28/23 0600  WBC 12.7* 13.2* 8.9 7.1 7.4  NEUTROABS 11.3*  --   --   --   --   HGB 12.5* 12.6* 11.2* 11.0* 11.6*  HCT 40.7 41.1 35.8* 34.7* 38.2*  MCV 83.9 86.0 82.3 82.2 82.2  PLT 368 306 313 293 332   Basic Metabolic Panel: Recent Labs  Lab 02/25/23 1159 02/26/23 0516 02/27/23 0446 02/28/23 0600  NA 138 135 136 135  K 3.6 3.5 3.7 4.2  CL 103 100 103 101  CO2 22 23 26 27   GLUCOSE 124* 84 94 150*  BUN 23* 20 16 15   CREATININE 1.78* 1.61* 1.54* 1.34*  CALCIUM  9.3 8.9 8.8* 9.2   Liver Function Tests: Recent Labs  Lab 02/25/23 1159 02/26/23 0516  AST 77* 74*  ALT 91* 98*  ALKPHOS 114 99  BILITOT 1.4* 1.4*  PROT 8.6* 7.3  ALBUMIN 3.6 3.0*   CBG: Recent Labs  Lab 03/01/23 0800 03/01/23 1130 03/01/23 1639 03/01/23 2155 03/02/23 0744  GLUCAP 85 121* 151* 203* 107*    Discharge time spent: greater than 30 minutes.  Signed: Danette Duos, MD [facility] 03/02/2023

## 2023-03-02 NOTE — Plan of Care (Signed)

## 2023-03-02 NOTE — Discharge Instructions (Signed)
 keep dressing clean dry and intact. He may be heel weight bearing in surgical shoe.

## 2023-03-02 NOTE — Progress Notes (Signed)
 OT Cancellation Note  Patient Details Name: Lizzie Michael [name] MRN: [id number] DOB: 1970-05-21   Cancelled Treatment:    Reason Eval/Treat Not Completed: OT screened, no needs identified, will sign off Patient reported being able to get up and complete ADLs himself in room. Patient reported he did not need OT at this time. OT to sign off Wynette Heckler, MS Acute Rehabilitation Department Office# [phone]  03/02/2023, 8:33 AM

## 2023-03-02 NOTE — Progress Notes (Signed)
Reviewed written d/c instructions w pt and all questions answered. He verbalized understanding. D/ C via w/c w all belongings in stable condition.

## 2023-03-03 ENCOUNTER — Telehealth: Payer: Self-pay | Admitting: *Deleted

## 2023-03-03 ENCOUNTER — Encounter: Payer: Self-pay | Admitting: *Deleted

## 2023-03-03 LAB — AEROBIC/ANAEROBIC CULTURE W GRAM STAIN (SURGICAL/DEEP WOUND)

## 2023-03-03 LAB — SURGICAL PATHOLOGY

## 2023-03-03 NOTE — Transitions of Care (Post Inpatient/ED Visit) (Signed)
03/03/2023  Name: [name] MRN: [id number] DOB: 09-Nov-1970  Today's TOC FU Call Status: Today's TOC FU Call Status:: Successful TOC FU Call Completed TOC FU Call Complete Date: 03/03/23 Patient's Name and Date of Birth confirmed.  Transition Care Management Follow-up Telephone Call Date of Discharge: 03/02/23 Discharge Facility: [facility]) Type of Discharge: Inpatient Admission Primary Inpatient Discharge Diagnosis:: Diabetic infection of left foot How have you been since you were released from the hospital?:  (eating and drinking well, no issues bowel/ bladder, ambulating w/ surgical shoe to left foot, heel wt. bearing, pt states " I'm doing great") Any questions or concerns?: No  Items Reviewed: Did you receive and understand the discharge instructions provided?: Yes Medications obtained,verified, and reconciled?: Yes (Medications Reviewed) Any new allergies since your discharge?: No Dietary orders reviewed?: Yes Type of Diet Ordered:: carbohydrate modified Do you have support at home?: Yes People in Home: spouse Name of Support/Comfort Primary Source: [name] Patient declines enrollment in [employer] 30 day program Reviewed signs/ symptoms of infection, reportable signs/ symptoms Patient states dressing is clean, dry, intact to left toe amputation site, pt is wearing surgical shoe RN Care Manager reviewed importance of checking blood pressure daily with recent medication changes and follow up with primary care provider for management  Medications Reviewed Today: Medications Reviewed Today     Reviewed by Audrie Gallus, RN (Registered Nurse) on 03/03/23 at [phone]  Med List Status: <None>   Medication Order Taking? Sig Documenting Provider Last Dose Status Informant  amoxicillin-clavulanate (AUGMENTIN) 875-125 MG tablet [id number] Yes Take 1 tablet by mouth 2 (two) times daily for 14 days. Regalado, Prentiss Bells, MD Taking Active   ascorbic acid (VITAMIN C) 500 MG  tablet [id number] Yes Take 500 mg by mouth daily. [provider] Taking Active Self, Pharmacy Records  aspirin EC 81 MG tablet [id number] Yes Take 81 mg by mouth daily. [provider] Taking Active Self, Pharmacy Records  atorvastatin (LIPITOR) 80 MG tablet [id number] Yes TAKE 1 TABLET BY MOUTH EVERY DAY Shelva Majestic, MD Taking Active Self, Pharmacy Records  Cyanocobalamin (VITAMIN B-12) 5000 MCG SUBL [id number] Yes Place 5,000 mcg under the tongue daily. [provider] Taking Active Self, Pharmacy Records  doxycycline (VIBRA-TABS) 100 MG tablet [id number] Yes Take 1 tablet (100 mg total) by mouth every 12 (twelve) hours for 14 days. Regalado, Prentiss Bells, MD Taking Active   fenofibrate (TRICOR) 48 MG tablet [id number] Yes TAKE 1 TABLET BY MOUTH EVERY DAY Shelva Majestic, MD Taking Active Self, Pharmacy Records  Ferrous Sulfate (IRON PO) [id number] Yes Take 1 tablet by mouth daily. [provider] Taking Active Self, Pharmacy Records  insulin aspart (NOVOLOG FLEXPEN) 100 UNIT/ML FlexPen [id number] Yes Inject 3 Units into the skin 2 (two) times daily. Regalado, Belkys A, MD Taking Active   insulin degludec (TRESIBA FLEXTOUCH) 200 UNIT/ML FlexTouch Pen [id number] Yes Inject 10 Units into the skin daily. Regalado, Prentiss Bells, MD Taking Active   Multiple Vitamin (MULTIVITAMIN WITH MINERALS) TABS tablet [id number] Yes Take 1 tablet by mouth daily. [provider] Taking Active Self, Pharmacy Records  Multiple Vitamins-Minerals (ZINC PO) [id number] Yes Take 1 tablet by mouth daily. [provider] Taking Active Self, Pharmacy Records  Omega-3 Fatty Acids (FISH OIL) 1360 MG CAPS [id number]  Take 1,360 mg by mouth daily.  [provider]  Active Self, Pharmacy Records  pyridoxine (B-6) 100 MG tablet [id number]  Take 100 mg by mouth daily. [provider]  Active Self, Pharmacy Records  tirzepatide [facility]) 5 MG/0.5ML Pen [id number] Yes Inject 5 mg  into the skin once a week. Shelva Majestic, MD Taking Active Self, Pharmacy Records            Home Care and Equipment/Supplies: Were Home Health Services Ordered?: No Any new equipment or medical supplies ordered?: No  Functional Questionnaire: Do you need assistance with bathing/showering or dressing?: No Do you need assistance with meal preparation?: No Do you need assistance with eating?: No Do you have difficulty maintaining continence: No Do you need assistance with getting out of bed/getting out of a chair/moving?: No Do you have difficulty managing or taking your medications?: No  Follow up appointments reviewed: PCP Follow-up appointment confirmed?: Yes Date of PCP follow-up appointment?: 03/11/23 Follow-up Provider: Dr. Durene Cal  @ 10 am Specialist Hospital Follow-up appointment confirmed?: Yes Date of Specialist follow-up appointment?: 03/04/23 Follow-Up Specialty Provider:: Dr. Allena Katz  @ 315 pm Do you need transportation to your follow-up appointment?: No Do you understand care options if your condition(s) worsen?: Yes-patient verbalized understanding  SDOH Interventions Today    Flowsheet Row Most Recent Value  SDOH Interventions   Food Insecurity Interventions Intervention Not Indicated  Housing Interventions Intervention Not Indicated  Transportation Interventions Intervention Not Indicated  Utilities Interventions Intervention Not Indicated       Irving Shows [facility], BSN RN Care Manager/ Transition of Care [place] [phone]

## 2023-03-04 ENCOUNTER — Ambulatory Visit: Payer: BLUE CROSS/BLUE SHIELD | Admitting: Podiatry

## 2023-03-04 DIAGNOSIS — Z89412 Acquired absence of left great toe: Secondary | ICD-10-CM | POA: Diagnosis not present

## 2023-03-04 LAB — AEROBIC/ANAEROBIC CULTURE W GRAM STAIN (SURGICAL/DEEP WOUND): Gram Stain: NONE SEEN

## 2023-03-04 NOTE — Progress Notes (Signed)
 Subjective:  Patient ID: [name], male    DOB: 21-Nov-1970,  MRN: [id number]  Chief Complaint  Patient presents with   Foot Ulcer    DOS: 02/27/2023 Procedure: Left hallux amputation  53 y.o. male returns for post-op check.  He states is doing well denies any other acute complaints he was recently admitted to the hospital and underwent a surgery by Dr. Ralene Cork.  Review of Systems: Negative except as noted in the HPI. Denies N/V/F/Ch.  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   (06-17-2019 pt stated has been stable with no changes and followed by pcp)   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HCC)    11-06-2016  lower extremity angiography (dr Edilia Bo)  mil tibial disease w/ occlusion of the distal peroneal artery and dorsalis pedis artery but has widely patent posterior tibial and anterior tibial arterties   Type 2 diabetes mellitus treated with insulin (HCC)    followed by pcp   (06-17-2019  pt stated checks blood sugar daily in am,  fasting sugar-- average 71)   Vitamin B 12 deficiency     Current Outpatient Medications:    amoxicillin-clavulanate (AUGMENTIN) 875-125 MG tablet, Take 1 tablet by mouth 2 (two) times daily for 14 days., Disp: 28 tablet, Rfl: 0   ascorbic acid (VITAMIN C) 500 MG tablet, Take 500 mg by mouth daily., Disp: , Rfl:    aspirin EC 81 MG tablet, Take 81 mg by mouth daily., Disp: , Rfl:    atorvastatin (LIPITOR) 80 MG  tablet, TAKE 1 TABLET BY MOUTH EVERY DAY, Disp: 90 tablet, Rfl: 3   Cyanocobalamin (VITAMIN B-12) 5000 MCG SUBL, Place 5,000 mcg under the tongue daily., Disp: , Rfl:    doxycycline (VIBRA-TABS) 100 MG tablet, Take 1 tablet (100 mg total) by mouth every 12 (twelve) hours for 14 days., Disp: 28 tablet, Rfl: 0   fenofibrate (TRICOR) 48 MG tablet, TAKE 1 TABLET BY MOUTH EVERY DAY, Disp: 90 tablet, Rfl: 4   Ferrous Sulfate (IRON PO), Take 1 tablet by mouth daily., Disp: , Rfl:    insulin aspart (NOVOLOG FLEXPEN) 100 UNIT/ML FlexPen, Inject 3 Units into the skin 2 (two) times daily., Disp: 15 mL, Rfl: 11   insulin degludec (TRESIBA FLEXTOUCH) 200 UNIT/ML FlexTouch Pen, Inject 10 Units into the skin daily., Disp: 9 mL, Rfl: 3   Multiple Vitamin (MULTIVITAMIN WITH MINERALS) TABS tablet, Take 1 tablet by mouth daily., Disp: , Rfl:    Multiple Vitamins-Minerals (ZINC PO), Take 1 tablet by mouth daily., Disp: , Rfl:    Omega-3 Fatty Acids (FISH OIL) 1360 MG CAPS, Take 1,360 mg by mouth daily. , Disp: , Rfl:    pyridoxine (B-6) 100 MG tablet, Take 100 mg by mouth daily., Disp: , Rfl:    tirzepatide (MOUNJARO) 5 MG/0.5ML Pen, Inject 5 mg into the skin once a week., Disp: 6 mL,  Rfl: 1  Social History   Tobacco Use  Smoking Status Never  Smokeless Tobacco Never    No Known Allergies Objective:  There were no vitals filed for this visit. There is no height or weight on file to calculate BMI. Constitutional Well developed. Well nourished.  Vascular Foot warm and well perfused. Capillary refill normal to all digits.   Neurologic Normal speech. Oriented to person, place, and time. Epicritic sensation to light touch grossly present bilaterally.  Dermatologic Skin healing well without signs of infection. Skin edges well coapted without signs of infection.  Orthopedic: Tenderness to palpation noted about the surgical site.   Radiographs: 3 views of skeletally mature adult left foot:3 views of skeletally  mature adult left foot noted.  Left hallux amputation noted partial Assessment:  No diagnosis found. Plan:  Patient was evaluated and treated and all questions answered.  S/p foot surgery left -Progressing as expected post-operatively. -XR: See above -WB Status: Weightbearing as tolerated in surgical shoe -Sutures: Intact.  No clinical signs of dehiscence noted no complication noted. -Medications: None -Foot redressed.  No follow-ups on file.

## 2023-03-05 LAB — CULTURE, BLOOD (ROUTINE X 2)
Culture: NO GROWTH
Culture: NO GROWTH

## 2023-03-11 ENCOUNTER — Inpatient Hospital Stay: Payer: BLUE CROSS/BLUE SHIELD | Admitting: Family Medicine

## 2023-03-15 ENCOUNTER — Encounter: Payer: Self-pay | Admitting: Podiatry

## 2023-03-18 ENCOUNTER — Telehealth: Payer: Self-pay | Admitting: Podiatry

## 2023-03-18 ENCOUNTER — Ambulatory Visit: Payer: BLUE CROSS/BLUE SHIELD | Admitting: Podiatry

## 2023-03-18 DIAGNOSIS — E11621 Type 2 diabetes mellitus with foot ulcer: Secondary | ICD-10-CM | POA: Diagnosis not present

## 2023-03-18 DIAGNOSIS — L97412 Non-pressure chronic ulcer of right heel and midfoot with fat layer exposed: Secondary | ICD-10-CM | POA: Diagnosis not present

## 2023-03-18 DIAGNOSIS — Z89412 Acquired absence of left great toe: Secondary | ICD-10-CM

## 2023-03-18 NOTE — Telephone Encounter (Signed)
 Completed STD paperwork from [facility] for patient ....  He requested he p/u once done ....   LMVM to advise paperwork is ready for p/u in [place] office behind the check-in desk .Marland Kitchen...    J. Abbott -- 03/18/2023

## 2023-03-18 NOTE — Progress Notes (Unsigned)
 Subjective:  Patient ID: [name], male    DOB: Oct 22, 1970,  MRN: [id number]  Chief Complaint  Patient presents with   Routine Post Op    History of amputation of left great toe (    DOS: 02/27/2023 Procedure: Left hallux amputation  53 y.o. male returns for post-op check.  He states is doing well denies any other acute complaints he was recently admitted to the hospital and underwent a surgery by Dr. Ralene Cork.  Review of Systems: Negative except as noted in the HPI. Denies N/V/F/Ch.  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   (06-17-2019 pt stated has been stable with no changes and followed by pcp)   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HCC)    11-06-2016  lower extremity angiography (dr Edilia Bo)  mil tibial disease w/ occlusion of the distal peroneal artery and dorsalis pedis artery but has widely patent posterior tibial and anterior tibial arterties   Type 2 diabetes mellitus treated with insulin (HCC)    followed by pcp   (06-17-2019  pt stated checks blood sugar daily in am,  fasting sugar-- average 71)   Vitamin B 12 deficiency     Current Outpatient Medications:    ascorbic acid (VITAMIN C) 500 MG tablet, Take 500 mg by mouth daily., Disp: , Rfl:    aspirin EC 81 MG tablet, Take 81 mg by mouth daily., Disp: , Rfl:    atorvastatin (LIPITOR) 80 MG tablet, TAKE 1 TABLET BY MOUTH EVERY DAY, Disp: 90 tablet, Rfl: 3    Cyanocobalamin (VITAMIN B-12) 5000 MCG SUBL, Place 5,000 mcg under the tongue daily., Disp: , Rfl:    fenofibrate (TRICOR) 48 MG tablet, TAKE 1 TABLET BY MOUTH EVERY DAY, Disp: 90 tablet, Rfl: 4   Ferrous Sulfate (IRON PO), Take 1 tablet by mouth daily., Disp: , Rfl:    insulin aspart (NOVOLOG FLEXPEN) 100 UNIT/ML FlexPen, Inject 3 Units into the skin 2 (two) times daily., Disp: 15 mL, Rfl: 11   insulin degludec (TRESIBA FLEXTOUCH) 200 UNIT/ML FlexTouch Pen, Inject 10 Units into the skin daily., Disp: 9 mL, Rfl: 3   Multiple Vitamin (MULTIVITAMIN WITH MINERALS) TABS tablet, Take 1 tablet by mouth daily., Disp: , Rfl:    Multiple Vitamins-Minerals (ZINC PO), Take 1 tablet by mouth daily., Disp: , Rfl:    Omega-3 Fatty Acids (FISH OIL) 1360 MG CAPS, Take 1,360 mg by mouth daily. , Disp: , Rfl:    pyridoxine (B-6) 100 MG tablet, Take 100 mg by mouth daily., Disp: , Rfl:    tirzepatide (MOUNJARO) 5 MG/0.5ML Pen, Inject 5 mg into the skin once a week., Disp: 6 mL, Rfl: 1  Social History   Tobacco Use  Smoking Status Never  Smokeless Tobacco Never    No Known Allergies Objective:  There were no vitals filed for this visit. There is  no height or weight on file to calculate BMI. Constitutional Well developed. Well nourished.  Vascular Foot warm and well perfused. Capillary refill normal to all digits.   Neurologic Normal speech. Oriented to person, place, and time. Epicritic sensation to light touch grossly present bilaterally.  Dermatologic Skin completely reepithelialized.  No signs of Deis is noted no complication noted.  Orthopedic: Tenderness to palpation noted about the surgical site.   Radiographs: 3 views of skeletally mature adult left foot:3 views of skeletally mature adult left foot noted.  Left hallux amputation noted partial Assessment:  No diagnosis found. Plan:  Patient was evaluated and treated and all questions answered.  S/p foot surgery left -Progressing as expected  post-operatively. -XR: See above -WB Status: Weightbearing as tolerated in surgical shoe -Sutures: Removed no clinical signs of dehiscence noted no complication noted. -Medications: None -Foot redressed.  No follow-ups on file.

## 2023-03-19 ENCOUNTER — Ambulatory Visit: Payer: BLUE CROSS/BLUE SHIELD | Admitting: Family Medicine

## 2023-03-19 ENCOUNTER — Encounter: Payer: Self-pay | Admitting: Family Medicine

## 2023-03-19 VITALS — BP 130/82 | HR 95 | Temp 98.2°F | Ht 72.0 in | Wt 300.8 lb

## 2023-03-19 DIAGNOSIS — E1149 Type 2 diabetes mellitus with other diabetic neurological complication: Secondary | ICD-10-CM

## 2023-03-19 DIAGNOSIS — Z7985 Long-term (current) use of injectable non-insulin antidiabetic drugs: Secondary | ICD-10-CM

## 2023-03-19 DIAGNOSIS — Z89619 Acquired absence of unspecified leg above knee: Secondary | ICD-10-CM | POA: Diagnosis not present

## 2023-03-19 DIAGNOSIS — Z87898 Personal history of other specified conditions: Secondary | ICD-10-CM | POA: Diagnosis not present

## 2023-03-19 DIAGNOSIS — I10 Essential (primary) hypertension: Secondary | ICD-10-CM

## 2023-03-19 DIAGNOSIS — E1169 Type 2 diabetes mellitus with other specified complication: Secondary | ICD-10-CM

## 2023-03-19 DIAGNOSIS — Z8739 Personal history of other diseases of the musculoskeletal system and connective tissue: Secondary | ICD-10-CM | POA: Insufficient documentation

## 2023-03-19 DIAGNOSIS — N179 Acute kidney failure, unspecified: Secondary | ICD-10-CM

## 2023-03-19 DIAGNOSIS — Z794 Long term (current) use of insulin: Secondary | ICD-10-CM

## 2023-03-19 DIAGNOSIS — N1831 Chronic kidney disease, stage 3a: Secondary | ICD-10-CM

## 2023-03-19 NOTE — Assessment & Plan Note (Signed)
 Blood pressure is well-controlled today without medication.  We discussed if blood pressure trends up over 135/85 with checking 3 times a week to restart ramipril 5 mg daily-may also need to start hydrochlorothiazide in the future

## 2023-03-19 NOTE — Progress Notes (Signed)
 Phone [phone]   Subjective:  [name] is a 53 y.o. year old very pleasant male patient who presents for transitional care management and hospital follow up for diabetic infection of the left foot leading to amputation due to osteomyelitis of the great toe of the left foot. Patient was hospitalized from February 25, 2023 to March 02, 2023. A TCM phone call was completed on 03/31/2023. Medical complexity moderate-noted at end of visit that it was outside of the range for TCM at day 17  Patient presented to the hospital with left great toe swelling (history of ray amputation on the right foot)-he had a swelling and blister in that area that started 3 weeks prior to admission.  He was febrile, tachypneic and tachycardic on admission-diagnosed with sepsis due to left great toe osteomyelitis  Sepsis was ultimately due to osteomyelitis of the left great toe.  Fluid/tissue cultures from surgery showed Enterococcus, Staph aureus, Proteus Mirabella's-blood culture also showed Staphylococcus species, Enterobacterales and Proteus- bacteremia from these.  Treated with IV antibiotics linezolid, ceftriaxone and Flagyl.  MRI of the left foot showed plantar soft tissue ulceration at the tip of the great toe with sinus tract standing along the inferior aspect of the first distal phalanx with associated osteomyelitis of the first distal phalanx and probable first IP joint septic arthritis.  Complete tear of the flexor hallux is longus tendon at the base of the first distal pharynx.  Small 1 cm abscess overlying the distal extensor hallux longus tendon - Podiatry was consulted and patient underwent left partial hallux amputation on February 7 with Dr. Allena Katz - Repeat blood cultures on February 7th and 8th showed no growth-these are now final and no growth - Infectious disease was consulted and recommended 2 weeks of Augmentin total.  Culture eventually came back with Enterococcus faecalis and  Staphylococcus-patient was placed on 14 days of doxycycline as well as a result- he has finished both doxycycline and Augmentin at this point -Plan is for short-term disability-paperwork was signed by Dr. Allena Katz yesterday- already completed.    Patient did have acute kidney injury on top of stage II kidney disease with baseline creatinine of 1.4 and peak up to 1.7 but trended down with IV fluids-lisinopril and hydrochlorothiazide were held at discharge  For his diabetes patient was resumed on home dose of Tresiba on discharge- more variation since being home- has had to reduce. No low blood sugars but also having to adjust for highs. Most recent dose 90 units last night. Still on Mounjaro. Meal time down to 10-12 units - he will let me know when he needs more insulin but has good supply at the moment   He had a transient transaminitis with statins were restarted on discharge- atorvastatin 80 mg and also on fienofibrate 48 mg daily.   For hypertension once again ramipril and hydrochlorothiazide were held due to AKI- still off of those   See problem oriented charting as well  Past Medical History-  Patient Active Problem List   Diagnosis Date Noted   History of diabetes-related lower limb amputation (HCC) 09/25/2022    Priority: High   Morbid obesity (HCC) 03/08/2015    Priority: High   Type II diabetes mellitus with neurological manifestations (HCC) 03/07/2015    Priority: High   History of osteomyelitis 03/19/2023    Priority: Medium    Pituitary adenoma (HCC) 11/07/2021    Priority: Medium    Diabetic retinopathy (HCC) 01/25/2018    Priority: Medium  Diabetic neuropathy (HCC) 11/17/2016    Priority: Medium    Elevated LFTs     Priority: Medium    Type 2 diabetes mellitus with stage 3 chronic kidney disease, with long-term current use of insulin (HCC) 03/20/2015    Priority: Medium    Hyperlipidemia associated with type 2 diabetes mellitus (HCC) 03/08/2015    Priority: Medium     Essential hypertension 03/08/2015    Priority: Medium    Charcot's joint of foot, right 11/18/2019    Priority: Low   B12 deficiency 11/17/2016    Priority: Low   Iron deficiency anemia 11/17/2016    Priority: Low   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 Current Outpatient Medications  Medication Sig Dispense Refill   ascorbic acid (VITAMIN C) 500 MG tablet Take 500 mg by mouth daily.     aspirin EC 81 MG tablet Take 81 mg by mouth daily.     atorvastatin (LIPITOR) 80 MG tablet TAKE 1 TABLET BY MOUTH EVERY DAY 90 tablet 3   Cyanocobalamin (VITAMIN B-12) 5000 MCG SUBL Place 5,000 mcg under the tongue daily.     fenofibrate (TRICOR) 48 MG tablet TAKE 1 TABLET BY MOUTH EVERY DAY 90 tablet 4   Ferrous Sulfate (IRON PO) Take 1 tablet by mouth daily.     insulin aspart (NOVOLOG FLEXPEN) 100 UNIT/ML FlexPen Inject 3 Units into the skin 2 (two) times daily. 15 mL 11   insulin degludec (TRESIBA FLEXTOUCH) 200 UNIT/ML FlexTouch Pen Inject 10 Units into the skin daily. 9 mL 3   Multiple Vitamin (MULTIVITAMIN WITH MINERALS) TABS tablet Take 1 tablet by mouth daily.     Multiple Vitamins-Minerals (ZINC PO) Take 1 tablet by mouth daily.     Omega-3 Fatty Acids (FISH OIL) 1360 MG CAPS Take 1,360 mg by mouth daily.      pyridoxine (B-6) 100 MG tablet Take 100 mg by mouth daily.     tirzepatide [facility]) 5 MG/0.5ML Pen Inject 5 mg into the skin once a week. 6 mL 1   No current facility-administered medications for this visit.   Objective  Objective:  BP 130/82   Pulse 95   Temp 98.2 F (36.8 C)   Ht 6' (1.829 m)    Wt (!) 300 lb 12.8 oz (136.4 kg)   SpO2 98%   BMI 40.80 kg/m  Gen: NAD, resting comfortably CV: RRR no murmurs rubs or gallops Lungs: CTAB no crackles, wheeze, rhonchi Abdomen: soft/nontender/nondistended/normal bowel sounds. No rebound or guarding.  Ext: Trace edema-left great toe status post amputation of distal phalanx with healing scar noted-no exudate or drainage.  More remotely amputated distal phalanx of second toe also noted Skin: warm, dry   Assessment and Plan:   # Hospital follow-up/TCM-builders regular follow-up Assessment & Plan History of osteomyelitis Patient with appropriate treatment status post amputation of distal phalanx of great toe on the left foot.  Also received 2-week course of Augmentin and doxycycline which she is already completed -Encouraged him keep follow-up in 1 month with Dr. Allena Katz of podiatry and he agrees History of bacteremia Appropriate treatment for Proteus, Staphylococcus,  Enterococcus with 2 weeks of doxycycline and Augmentin per infectious disease.  Repeat blood cultures were negative after initial treatment -Infectious disease did not recommend further follow-up outpatient and patient is well aware of warning signs to return to care-fever, worsening redness, worsening swelling History of diabetes-related lower limb amputation (HCC) [place] amputation from this most recent hospitalization but prior amputation of the fourth and fifth ray of the right foot Type II diabetes mellitus with neurological manifestations (HCC) With removal infection patient has noted blood sugars trending down some and has had to reduce his insulin-can continue Tresiba 90 units for now and short acting insulin 10 to 12 units-he traditionally has done a very good job adjusting his insulin and he knows I am here to help.  Denies lows and most recent A1c very well-controlled -Continue Mounjaro as well   Lab Results  Component Value Date   HGBA1C 6.5 01/30/2023   HGBA1C 8.8  (H) 09/25/2022   HGBA1C 8.4 (H) 04/21/2022    Essential hypertension Blood pressure is well-controlled today without medication.  We discussed if blood pressure trends up over 135/85 with checking 3 times a week to restart ramipril 5 mg daily-may also need to start hydrochlorothiazide in the future Acute renal failure superimposed on stage 3a chronic kidney disease, unspecified acute renal failure type (HCC) Appears to largely resolve with hydration and treatment of illness-check again today as well as CBC to evaluate prior anemia  Recommended follow up: Return for next already scheduled visit or sooner if needed. Future Appointments  Date Time Provider Department Center  04/15/2023  8:15 AM Candelaria Stagers, DPM [facility]  05/29/2023  9:20 AM Durene Cal Aldine Contes, MD [facility]    Lab/Order associations:   ICD-10-CM   1. History of osteomyelitis  Z87.39     2. History of bacteremia  Z87.898     3. History of diabetes-related lower limb amputation (HCC)  Z89.619    E11.69     4. Type II diabetes mellitus with neurological manifestations (HCC)  E11.49 Comprehensive metabolic panel    CBC with Differential/Platelet    5. Essential hypertension  I10 Comprehensive metabolic panel    CBC with Differential/Platelet    6. Acute renal failure superimposed on stage 3a chronic kidney disease, unspecified acute renal failure type (HCC)  N17.9    N18.31       No orders of the defined types were placed in this encounter.   Return precautions advised.  Tana Conch, MD

## 2023-03-19 NOTE — Patient Instructions (Addendum)
 Monitor blood pressure at least 3 days a week- if blood pressure gets back over 135/85 can restart ramipril as first step and update me- if goes up over that again let me know but stay off of both for now  Please stop by lab before you go If you have mychart- we will send your results within 3 business days of [place] receiving them.  If you do not have mychart- we will call you about results within 5 business days of [place] receiving them.  *please also note that you will see labs on mychart as soon as they post. I will later go in and write notes on them- will say "notes from Dr. Durene Cal"   Recommended follow up: Return for next already scheduled visit or sooner if needed.

## 2023-03-19 NOTE — Assessment & Plan Note (Signed)
 Patient with appropriate treatment status post amputation of distal phalanx of great toe on the left foot.  Also received 2-week course of Augmentin and doxycycline which she is already completed -Encouraged him keep follow-up in 1 month with Dr. Allena Katz of podiatry and he agrees

## 2023-03-19 NOTE — Assessment & Plan Note (Signed)
 [place] amputation from this most recent hospitalization but prior amputation of the fourth and fifth ray of the right foot

## 2023-03-19 NOTE — Assessment & Plan Note (Signed)
 With removal infection patient has noted blood sugars trending down some and has had to reduce his insulin-can continue Tresiba 90 units for now and short acting insulin 10 to 12 units-he traditionally has done a very good job adjusting his insulin and he knows I am here to help.  Denies lows and most recent A1c very well-controlled -Continue Mounjaro as well   Lab Results  Component Value Date   HGBA1C 6.5 01/30/2023   HGBA1C 8.8 (H) 09/25/2022   HGBA1C 8.4 (H) 04/21/2022

## 2023-03-19 NOTE — Assessment & Plan Note (Signed)
 Appears to largely resolve with hydration and treatment of illness-check again today as well as CBC to evaluate prior anemia

## 2023-03-20 LAB — COMPREHENSIVE METABOLIC PANEL
ALT: 43 U/L (ref 0–53)
AST: 30 U/L (ref 0–37)
Albumin: 4 g/dL (ref 3.5–5.2)
Alkaline Phosphatase: 75 U/L (ref 39–117)
BUN: 18 mg/dL (ref 6–23)
CO2: 28 meq/L (ref 19–32)
Calcium: 9.7 mg/dL (ref 8.4–10.5)
Chloride: 102 meq/L (ref 96–112)
Creatinine, Ser: 1.23 mg/dL (ref 0.40–1.50)
GFR: 67.3 mL/min (ref >60.00)
Glucose, Bld: 92 mg/dL (ref 70–99)
Potassium: 4.5 meq/L (ref 3.5–5.1)
Sodium: 137 meq/L (ref 135–145)
Total Bilirubin: 0.9 mg/dL (ref 0.2–1.2)
Total Protein: 8.1 g/dL (ref 6.0–8.3)

## 2023-03-20 LAB — CBC WITH DIFFERENTIAL/PLATELET
Basophils Absolute: 0.1 10*3/uL (ref 0.0–0.1)
Basophils Relative: 0.8 % (ref 0.0–3.0)
Eosinophils Absolute: 0.1 10*3/uL (ref 0.0–0.7)
Eosinophils Relative: 1.5 % (ref 0.0–5.0)
HCT: 38.1 % — ABNORMAL LOW (ref 39.0–52.0)
Hemoglobin: 12.3 g/dL — ABNORMAL LOW (ref 13.0–17.0)
Lymphocytes Relative: 21.5 % (ref 12.0–46.0)
Lymphs Abs: 1.9 10*3/uL (ref 0.7–4.0)
MCHC: 32.3 g/dL (ref 30.0–36.0)
MCV: 80.8 fl (ref 78.0–100.0)
Monocytes Absolute: 0.4 10*3/uL (ref 0.1–1.0)
Monocytes Relative: 5.1 % (ref 3.0–12.0)
Neutro Abs: 6.3 10*3/uL (ref 1.4–7.7)
Neutrophils Relative %: 71.1 % (ref 43.0–77.0)
Platelets: 345 10*3/uL (ref 150.0–400.0)
RBC: 4.72 Mil/uL (ref 4.22–5.81)
RDW: 14.1 % (ref 11.5–15.5)
WBC: 8.9 10*3/uL (ref 4.0–10.5)

## 2023-03-21 ENCOUNTER — Encounter: Payer: Self-pay | Admitting: Family Medicine

## 2023-03-28 ENCOUNTER — Other Ambulatory Visit: Payer: Self-pay | Admitting: Family Medicine

## 2023-04-07 ENCOUNTER — Ambulatory Visit: Admitting: Podiatry

## 2023-04-07 DIAGNOSIS — M76829 Posterior tibial tendinitis, unspecified leg: Secondary | ICD-10-CM | POA: Diagnosis not present

## 2023-04-07 DIAGNOSIS — M24571 Contracture, right ankle: Secondary | ICD-10-CM

## 2023-04-07 DIAGNOSIS — M7671 Peroneal tendinitis, right leg: Secondary | ICD-10-CM | POA: Diagnosis not present

## 2023-04-07 NOTE — Progress Notes (Signed)
 Subjective:  Patient ID: [name], male    DOB: 07/30/1970,  MRN: [id number]  Chief Complaint  Patient presents with   Wound Check    53 y.o. male presents with the above complaint.  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  Patient states that the rotation of the foot is ultimate cause of continuous opening of the ulceration.  He would like to discuss next treatment plan for this.  He has continued to fail conservative care with multiple times of closing and opening of the wound on the lateral side of the foot due to cavovarus foot structure.  There is some flexibility associate with a would like to discuss surgical options at this time.   Review of Systems: Negative except as noted in the HPI. Denies N/V/F/Ch.  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   (06-17-2019 pt stated has been stable with no changes and followed by pcp)   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HCC)    11-06-2016  lower extremity angiography (dr Edilia Bo)  mil tibial disease w/ occlusion of the distal peroneal artery and dorsalis pedis artery but has widely patent posterior tibial and anterior tibial arterties   Type 2 diabetes mellitus treated with insulin (HCC)    followed by pcp   (06-17-2019  pt stated checks blood sugar daily in am,  fasting sugar-- average 71)   Vitamin B 12 deficiency     Current  Outpatient Medications:    ascorbic acid (VITAMIN C) 500 MG tablet, Take 500 mg by mouth daily., Disp: , Rfl:    aspirin EC 81 MG tablet, Take 81 mg by mouth daily., Disp: , Rfl:    atorvastatin (LIPITOR) 80 MG tablet, TAKE 1 TABLET BY MOUTH EVERY DAY, Disp: 90 tablet, Rfl: 3   Cyanocobalamin (VITAMIN B-12) 5000 MCG SUBL, Place 5,000 mcg under the tongue daily., Disp: , Rfl:    fenofibrate (TRICOR) 48 MG tablet, TAKE 1 TABLET BY MOUTH EVERY DAY, Disp: 90 tablet, Rfl: 4   Ferrous Sulfate (IRON PO), Take 1 tablet by mouth daily., Disp: , Rfl:    insulin aspart (NOVOLOG FLEXPEN) 100 UNIT/ML FlexPen, Inject 3 Units into the skin 2 (two) times daily., Disp: 15 mL, Rfl: 11   insulin degludec (TRESIBA FLEXTOUCH) 200 UNIT/ML FlexTouch Pen, Inject 10 Units into the skin daily., Disp: 9 mL, Rfl: 3   Multiple Vitamin (MULTIVITAMIN WITH MINERALS) TABS tablet, Take 1 tablet by mouth daily., Disp: , Rfl:    Multiple Vitamins-Minerals (ZINC PO), Take 1 tablet by mouth daily., Disp: , Rfl:    Omega-3 Fatty Acids (FISH OIL) 1360 MG CAPS, Take 1,360 mg by mouth daily. , Disp: , Rfl:    pyridoxine (B-6) 100 MG tablet, Take 100 mg by mouth daily., Disp: , Rfl:    tirzepatide (MOUNJARO) 5 MG/0.5ML Pen,  INJECT 5 MG SUBCUTANEOUSLY WEEKLY, Disp: 6 mL, Rfl: 3  Social History   Tobacco Use  Smoking Status Never  Smokeless Tobacco Never    No Known Allergies Objective:  There were no vitals filed for this visit. There is no height or weight on file to calculate BMI. Constitutional Well developed. Well nourished.  Vascular Dorsalis pedis pulses palpable bilaterally. Posterior tibial pulses palpable bilaterally. Capillary refill normal to all digits.  No cyanosis or clubbing noted. Pedal hair growth normal.  Neurologic Normal speech. Oriented to person, place, and time. Epicritic sensation to light touch grossly present bilaterally.  Dermatologic Nails well groomed and normal in appearance. No open  wounds. No skin lesions.  Orthopedic: Right semiflexible cavovarus foot structure noted with somewhat reducible deformity.  Inversion and plantarflexion of the foot noted.  Weakening of the peroneal tendon is noted.  Posterior tibial tendon strength 5 out of 5 ankle equinus positive   Radiographs: None Assessment:  No diagnosis found. Plan:  Patient was evaluated and treated and all questions answered.  Right cavovarus foot structure with underlying ankle equinus with gastrocnemius contracture and tight posterior tibial tendon -All questions and concerns were discussed with the patient in extensive detail.  I discussed my preoperative postoperative plan with the patient in extensive detail she would benefit from percutaneous lengthening of the Achilles tendon with posterior tibial tendon lengthening with peroneal tenodesis.  I discussed with the patient extensive detail he states understanding like to proceed with surgery -Informed surgical risk consent was reviewed and read aloud to the patient.  I reviewed the films.  I have discussed my findings with the patient in great detail.  I have discussed all risks including but not limited to infection, stiffness, scarring, limp, disability, deformity, damage to blood vessels and nerves, numbness, poor healing, need for braces, arthritis, chronic pain, amputation, death.  All benefits and realistic expectations discussed in great detail.  I have made no promises as to the outcome.  I have provided realistic expectations.  I have offered the patient a 2nd opinion, which they have declined and assured me they preferred to proceed despite the risks   No follow-ups on file.   Cavovarus deformity Achilles tendon percutaneous lengthening, posterior tibial tendon lengthening and peroneal tenodesis

## 2023-04-08 ENCOUNTER — Telehealth: Payer: Self-pay | Admitting: Podiatry

## 2023-04-08 NOTE — Telephone Encounter (Signed)
 Wife has some questions regarding to scheduling the surgery so they can plan their vacation around the surgery. (Already have plans scheduled June 19th) Please call wife Hilda Lias) at [phone]

## 2023-04-15 ENCOUNTER — Ambulatory Visit: Payer: BLUE CROSS/BLUE SHIELD | Admitting: Podiatry

## 2023-04-20 LAB — HM DIABETES EYE EXAM

## 2023-04-30 ENCOUNTER — Telehealth: Payer: Self-pay | Admitting: Podiatry

## 2023-04-30 NOTE — Telephone Encounter (Signed)
 DOS: 05/25/23  (RT) TENDO-ACHILLES LENGTH X2  (RT)  TENODESIS   EFFECTIVE DATE: 11/09/2019   DEDUCTIBLE:  $750.00  REMAINING:  $750.00  OOP:  $3,050.00  REMAINING: $0.00  CO INSURANCE : 20%   PER ANGELICA D [facility] [employer] NO PRIOR AUTH IS REQ FOR CPT CODES [id number]  REF# [id number]

## 2023-05-03 ENCOUNTER — Other Ambulatory Visit: Payer: Self-pay | Admitting: Family Medicine

## 2023-05-07 ENCOUNTER — Ambulatory Visit (INDEPENDENT_AMBULATORY_CARE_PROVIDER_SITE_OTHER): Admitting: Podiatry

## 2023-05-07 DIAGNOSIS — L97412 Non-pressure chronic ulcer of right heel and midfoot with fat layer exposed: Secondary | ICD-10-CM | POA: Diagnosis not present

## 2023-05-07 DIAGNOSIS — E11621 Type 2 diabetes mellitus with foot ulcer: Secondary | ICD-10-CM

## 2023-05-07 NOTE — Progress Notes (Signed)
 Subjective:  Patient ID: [name], male    DOB: [birth year],  MRN: [id number]  Chief Complaint  Patient presents with   Wound Check    53 y.o. male presents for wound care.  Patient presents with right plantar wound he states he is having some pain wanted get evaluated before the surgery.  He is scheduled for surgery next 3week.  Review of Systems: Negative except as noted in the HPI. Denies N/V/F/Ch.  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   (06-17-2019 pt stated has been stable with no changes and followed by pcp)   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HCC)    11-06-2016  lower extremity angiography (dr Shaunna [place])  mil tibial disease w/ occlusion of the distal peroneal artery and dorsalis pedis artery but has widely patent posterior tibial and anterior tibial arterties   Type 2 diabetes mellitus treated with insulin (HCC)    followed by pcp   (06-17-2019  pt stated checks blood sugar daily in am,  fasting sugar-- average 71)   Vitamin B 12 deficiency     Current Outpatient Medications:    ascorbic acid (VITAMIN C) 500 MG tablet, Take 500 mg by mouth daily., Disp: , Rfl:    aspirin EC 81 MG tablet, Take 81 mg by mouth daily., Disp: , Rfl:    atorvastatin (LIPITOR) 80 MG tablet, TAKE 1 TABLET BY MOUTH EVERY DAY, Disp: 90 tablet, Rfl: 3   Cyanocobalamin (VITAMIN B-12) 5000 MCG SUBL, Place 5,000 mcg under the tongue daily., Disp:  , Rfl:    fenofibrate (TRICOR) 48 MG tablet, TAKE 1 TABLET BY MOUTH EVERY DAY, Disp: 90 tablet, Rfl: 4   Ferrous Sulfate (IRON PO), Take 1 tablet by mouth daily., Disp: , Rfl:    insulin aspart (NOVOLOG FLEXPEN) 100 UNIT/ML FlexPen, Inject 3 Units into the skin 2 (two) times daily., Disp: 15 mL, Rfl: 11   insulin degludec (TRESIBA FLEXTOUCH) 200 UNIT/ML FlexTouch Pen, Inject 10 Units into the skin daily., Disp: 9 mL, Rfl: 3   Insulin Pen Needle (B-D UF III MINI PEN NEEDLES) 31G X 5 MM MISC, USE TO INJECT INSULIN DAILY. DX: E11.9, Disp: 100 each, Rfl: 11   Multiple Vitamin (MULTIVITAMIN WITH MINERALS) TABS tablet, Take 1 tablet by mouth daily., Disp: , Rfl:    Multiple Vitamins-Minerals (ZINC PO), Take 1 tablet by mouth daily., Disp: , Rfl:    Omega-3 Fatty Acids (FISH OIL) 1360 MG CAPS, Take 1,360 mg by mouth daily. , Disp: , Rfl:    pyridoxine (B-6) 100 MG tablet, Take 100 mg by mouth daily., Disp: , Rfl:    tirzepatide (MOUNJARO) 5 MG/0.5ML Pen, INJECT 5 MG SUBCUTANEOUSLY WEEKLY, Disp: 6 mL, Rfl: 3  Social History   Tobacco Use  Smoking Status Never  Smokeless Tobacco Never    No Known Allergies Objective:  There were  no vitals filed for this visit. There is no height or weight on file to calculate BMI. Constitutional Well developed. Well nourished.  Vascular Dorsalis pedis pulses palpable bilaterally. Posterior tibial pulses palpable bilaterally. Capillary refill normal to all digits.  No cyanosis or clubbing noted. Pedal hair growth normal.  Neurologic Normal speech. Oriented to person, place, and time. Protective sensation absent  Dermatologic Wound Location: Right lateral hindfoot with fat layer exposed.  Does not expose any bone.  No purulent drainage noted no malodor present Wound Base: Mixed Granular/Fibrotic Peri-wound: Calloused Exudate: Scant/small amount Serous exudate Wound Measurements: -See below  Orthopedic: No pain to palpation either foot.   Radiographs:  None Assessment:   No diagnosis found.  Plan:  Patient was evaluated and treated and all questions answered.  Ulcer right lateral hindfoot ulcer -Debridement as below. -Dressed with Betadine wet-to-dry, DSD. -Continue off-loading with surgical shoe.  Procedure: Excisional Debridement of Wound Tool: Sharp chisel blade/tissue nipper Rationale: Removal of non-viable soft tissue from the wound to promote healing.  Anesthesia: none Pre-Debridement Wound Measurements: 1.9 cm x 1.3 cm x 0.3 cm  Post-Debridement Wound Measurements: 2.3 cm x 1.5 cm x 0.3 cm  Type of Debridement: Sharp Excisional Tissue Removed: Non-viable soft tissue Blood loss: Minimal (<50cc) Depth of Debridement: subcutaneous tissue. Technique: Sharp excisional debridement to bleeding, viable wound base.  Wound Progress: The wound has shifted a little bit we will continue to monitor the progression of it Dressing: Dry, sterile, compression dressing. Disposition: Patient tolerated procedure well. Patient to return in 1 week for follow-up.  No follow-ups on file.

## 2023-05-12 ENCOUNTER — Telehealth: Payer: Self-pay | Admitting: Podiatry

## 2023-05-12 NOTE — Telephone Encounter (Signed)
 [name] forms/notes to [phone] DOS 05/25/23

## 2023-05-21 NOTE — Telephone Encounter (Signed)
 Please see pt response.

## 2023-05-25 ENCOUNTER — Other Ambulatory Visit: Payer: Self-pay | Admitting: Podiatry

## 2023-05-25 DIAGNOSIS — M24571 Contracture, right ankle: Secondary | ICD-10-CM | POA: Diagnosis not present

## 2023-05-25 MED ORDER — OXYCODONE-ACETAMINOPHEN 5-325 MG PO TABS: 1.0000 | ORAL_TABLET | ORAL | 0 refills | Status: DC | PRN

## 2023-05-25 MED ORDER — IBUPROFEN 800 MG PO TABS: 800.0000 mg | ORAL_TABLET | Freq: Four times a day (QID) | ORAL | 1 refills | Status: DC | PRN

## 2023-05-29 ENCOUNTER — Encounter: Payer: Self-pay | Admitting: Family Medicine

## 2023-05-29 ENCOUNTER — Ambulatory Visit (INDEPENDENT_AMBULATORY_CARE_PROVIDER_SITE_OTHER): Payer: BLUE CROSS/BLUE SHIELD | Admitting: Family Medicine

## 2023-05-29 VITALS — BP 142/86 | HR 85 | Temp 98.4°F | Resp 18 | Ht 72.0 in | Wt 304.6 lb

## 2023-05-29 DIAGNOSIS — I10 Essential (primary) hypertension: Secondary | ICD-10-CM

## 2023-05-29 DIAGNOSIS — E785 Hyperlipidemia, unspecified: Secondary | ICD-10-CM | POA: Diagnosis not present

## 2023-05-29 DIAGNOSIS — Z7985 Long-term (current) use of injectable non-insulin antidiabetic drugs: Secondary | ICD-10-CM

## 2023-05-29 DIAGNOSIS — Z794 Long term (current) use of insulin: Secondary | ICD-10-CM | POA: Diagnosis not present

## 2023-05-29 DIAGNOSIS — E1149 Type 2 diabetes mellitus with other diabetic neurological complication: Secondary | ICD-10-CM

## 2023-05-29 DIAGNOSIS — E1169 Type 2 diabetes mellitus with other specified complication: Secondary | ICD-10-CM

## 2023-05-29 LAB — CBC WITH DIFFERENTIAL/PLATELET
Basophils Absolute: 0 10*3/uL (ref 0.0–0.1)
Basophils Relative: 0.3 % (ref 0.0–3.0)
Eosinophils Absolute: 0.1 10*3/uL (ref 0.0–0.7)
Eosinophils Relative: 1.9 % (ref 0.0–5.0)
HCT: 41.2 % (ref 39.0–52.0)
Hemoglobin: 13.5 g/dL (ref 13.0–17.0)
Lymphocytes Relative: 30.2 % (ref 12.0–46.0)
Lymphs Abs: 2.4 10*3/uL (ref 0.7–4.0)
MCHC: 32.8 g/dL (ref 30.0–36.0)
MCV: 80.4 fl (ref 78.0–100.0)
Monocytes Absolute: 0.3 10*3/uL (ref 0.1–1.0)
Monocytes Relative: 4.5 % (ref 3.0–12.0)
Neutro Abs: 4.9 10*3/uL (ref 1.4–7.7)
Neutrophils Relative %: 63.1 % (ref 43.0–77.0)
Platelets: 331 10*3/uL (ref 150.0–400.0)
RBC: 5.13 Mil/uL (ref 4.22–5.81)
RDW: 14.3 % (ref 11.5–15.5)
WBC: 7.8 10*3/uL (ref 4.0–10.5)

## 2023-05-29 LAB — LIPID PANEL
Cholesterol: 140 mg/dL (ref 0–200)
HDL: 43 mg/dL (ref >39.00)
LDL Cholesterol: 73 mg/dL (ref 0–99)
NonHDL: 96.81
Total CHOL/HDL Ratio: 3
Triglycerides: 118 mg/dL (ref 0.0–149.0)
VLDL: 23.6 mg/dL (ref 0.0–40.0)

## 2023-05-29 LAB — COMPREHENSIVE METABOLIC PANEL WITH GFR
ALT: 50 U/L (ref 0–53)
AST: 37 U/L (ref 0–37)
Albumin: 4.3 g/dL (ref 3.5–5.2)
Alkaline Phosphatase: 86 U/L (ref 39–117)
BUN: 16 mg/dL (ref 6–23)
CO2: 30 meq/L (ref 19–32)
Calcium: 10.1 mg/dL (ref 8.4–10.5)
Chloride: 103 meq/L (ref 96–112)
Creatinine, Ser: 1.31 mg/dL (ref 0.40–1.50)
GFR: 62.31 mL/min (ref >60.00)
Glucose, Bld: 80 mg/dL (ref 70–99)
Potassium: 4.2 meq/L (ref 3.5–5.1)
Sodium: 139 meq/L (ref 135–145)
Total Bilirubin: 0.8 mg/dL (ref 0.2–1.2)
Total Protein: 8.3 g/dL (ref 6.0–8.3)

## 2023-05-29 LAB — HEMOGLOBIN A1C: Hgb A1c MFr Bld: 6.3 % (ref 4.6–6.5)

## 2023-05-29 MED ORDER — RAMIPRIL 5 MG PO CAPS: 5.0000 mg | ORAL_CAPSULE | Freq: Every day | ORAL | 3 refills | Status: AC

## 2023-05-29 NOTE — Progress Notes (Signed)
 Phone [phone] In person visit   Subjective:   [name] is a 53 y.o. year old very pleasant male patient who presents for/with See problem oriented charting Chief Complaint  Patient presents with   Follow-up   Past Medical History-  Patient Active Problem List   Diagnosis Date Noted   History of diabetes-related lower limb amputation (HCC) 09/25/2022    Priority: High   Diabetic retinopathy (HCC) 01/25/2018    Priority: High   Diabetic neuropathy (HCC) 11/17/2016    Priority: High   Type II diabetes mellitus with neurological manifestations (HCC) 03/07/2015    Priority: High   History of osteomyelitis 03/19/2023    Priority: Medium    Pituitary adenoma (HCC) 11/07/2021    Priority: Medium    Elevated LFTs     Priority: Medium    Type 2 diabetes mellitus with stage 3 chronic kidney disease, with long-term current use of insulin  (HCC) 03/20/2015    Priority: Medium    Hyperlipidemia associated with type 2 diabetes mellitus (HCC) 03/08/2015    Priority: Medium    Essential hypertension 03/08/2015    Priority: Medium    Morbid obesity (HCC) 03/08/2015    Priority: Medium    Charcot's joint of foot, right 11/18/2019    Priority: Low   B12 deficiency 11/17/2016    Priority: Low   Iron deficiency anemia 11/17/2016    Priority: Low   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     aspirin  EC 81 MG tablet Take 81 mg by mouth daily.     atorvastatin  (LIPITOR) 80 MG tablet TAKE 1 TABLET BY MOUTH EVERY DAY 90 tablet 3   Cyanocobalamin  (VITAMIN B-12) 5000 MCG SUBL Place 5,000 mcg under the tongue daily.     fenofibrate  (TRICOR ) 48 MG tablet  TAKE 1 TABLET BY MOUTH EVERY DAY 90 tablet 4   Ferrous Sulfate  (IRON PO) Take 1 tablet by mouth daily.     ibuprofen  (ADVIL ) 800 MG tablet Take 1 tablet (800 mg total) by mouth every 6 (six) hours as needed. 60 tablet 1   insulin  aspart (NOVOLOG  FLEXPEN) 100 UNIT/ML FlexPen Inject 3 Units into the skin 2 (two) times daily. 15 mL 11   insulin  degludec (TRESIBA  FLEXTOUCH) 200 UNIT/ML FlexTouch Pen Inject 10 Units into the skin daily. 9 mL 3   Insulin  Pen Needle (B-D UF III MINI PEN NEEDLES) 31G X 5 MM MISC USE TO INJECT INSULIN  DAILY. DX: E11.9 100 each 11   Multiple Vitamin (MULTIVITAMIN WITH MINERALS) TABS tablet Take 1 tablet by mouth daily.     Multiple Vitamins-Minerals (ZINC  PO) Take 1 tablet by mouth daily.     Omega-3 Fatty Acids (FISH OIL) 1360 MG CAPS Take 1,360 mg by mouth daily.      oxyCODONE -acetaminophen  (PERCOCET) 5-325 MG tablet Take 1 tablet by mouth every 4 (four) hours as needed for severe pain (pain score 7-10). 30 tablet 0   pyridoxine  (B-6) 100 MG tablet Take 100 mg by mouth daily.     tirzepatide  (MOUNJARO ) 5 MG/0.5ML Pen INJECT 5 MG SUBCUTANEOUSLY WEEKLY 6 mL 3   No current facility-administered medications  for this visit.     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 Lydia Sams with [facility] -Planned surgery in May 2025 (just had on Monday) for cavovarus  foot structureas they think this is contributing to ongoing infection/wounds- he is recovering well - In 2025 had hospitalization for osteomyelitis including bacteremia  #Diabetes-typically with A1c at least under 7.5 S: Medication: Tresiba  at night up to 100 units- 90 units lately, NovoLog  10-15 twice daily- mainly 15 -Take Januvia  50 mg in the morning but trial of Mounjaro  5 mg recently has helped A1c- but holding right now due to surgery and restarts sunday CBGs- in general under 170 and most readings around 140 or less with no lows but did have one reading up over 210 with day after surgery Lab Results  Component Value Date   HGBA1C 6.5 01/30/2023   HGBA1C 8.8 (H) 09/25/2022   HGBA1C 8.4 (H) 04/21/2022  A/P: hopefully stable- update a1c today. Continue current meds for now   I discussed the microalbumin to creatinine lab error with patient that occurred with [facility] labs.  Essentially the ratio was off by a factor of 10.  In this patient's individual case unfortunately this means his ratio is mildly elevated with correction to 41 but down from 49 the year prior-we discussed potential benefit of adding ramipril  back in which he had been on in the past-he is in agreement- see hypertension section   # Hyperlipidemia-Ideal LDL under 70-tolerate under 100 S:Medication: Atorvastatin  80 mg daily along with fenofibrate  48 mg - also prefers to take aspirin  for primary prevention   Lab Results  Component Value Date   CHOL 176 04/21/2022   HDL 44.60 04/21/2022   LDLCALC 107 (H) 04/21/2022   LDLDIRECT 142.0 09/25/2022   TRIG 126.0 04/21/2022   CHOLHDL 4 04/21/2022   A/P: LDL has been mildly elevated-we will update with labs today with hopes to get LDL back under 100 at least-atorvastatin  in the past was at 60 mg but now at 80 mg   #Hypertension S: MEDICATION: none -HCTZ 12.5 mg and Ramipril  5 mg-may need to restart BP Readings from Last 3 Encounters:  05/29/23 (!) 142/86  03/19/23 130/82   03/02/23 129/77    A/P: blood pressure above goal- some of this could be surgery related but home readings variable with several readings over 140 so we opted to restart ramipril  also for kidney protection at just 5 mg  Recommended follow up: Return in about 4 months (around 09/29/2023) for followup or sooner if needed.Schedule b4 you leave. Future Appointments  Date Time Provider Department Center  06/03/2023 10:15 AM Velma Ghazi, DPM [facility]  06/10/2023 10:45 AM Lydia Sams Florentina Huntsman, DPM [facility]   Lab/Order associations: fasting other than 4 oz sugar free strawberry lemonade   ICD-10-CM  1. Type II diabetes mellitus with neurological manifestations (HCC)  E11.49     2. Essential hypertension  I10     3. Hyperlipidemia associated with type 2 diabetes mellitus (HCC)  E11.69    E78.5       No orders of the defined types were placed in this encounter.   Return precautions advised.  Clarisa Crooked, MD

## 2023-05-29 NOTE — Patient Instructions (Addendum)
 blood pressure above goal- some of this could be surgery related but home readings variable with several readings over 140 so we opted to restart ramipril  also for kidney protection at just 5 mg -update me with home blood pressure in 2-3 weeks  Please stop by lab before you go If you have mychart- we will send your results within 3 business days of us  receiving them.  If you do not have mychart- we will call you about results within 5 business days of us  receiving them.  *please also note that you will see labs on mychart as soon as they post. I will later go in and write notes on them- will say "notes from Dr. Arlene Ben"   Recommended follow up: Return in about 4 months (around 09/29/2023) for followup or sooner if needed.Schedule b4 you leave.

## 2023-06-03 ENCOUNTER — Ambulatory Visit (INDEPENDENT_AMBULATORY_CARE_PROVIDER_SITE_OTHER): Admitting: Podiatry

## 2023-06-03 DIAGNOSIS — Z9889 Other specified postprocedural states: Secondary | ICD-10-CM

## 2023-06-03 DIAGNOSIS — M7671 Peroneal tendinitis, right leg: Secondary | ICD-10-CM

## 2023-06-03 DIAGNOSIS — M76829 Posterior tibial tendinitis, unspecified leg: Secondary | ICD-10-CM

## 2023-06-03 DIAGNOSIS — L97412 Non-pressure chronic ulcer of right heel and midfoot with fat layer exposed: Secondary | ICD-10-CM

## 2023-06-03 DIAGNOSIS — M24571 Contracture, right ankle: Secondary | ICD-10-CM

## 2023-06-03 DIAGNOSIS — E11621 Type 2 diabetes mellitus with foot ulcer: Secondary | ICD-10-CM

## 2023-06-03 NOTE — Progress Notes (Signed)
 Subjective:  Patient ID: [name], male    DOB: 05-22-1970,  MRN: [id number]  Chief Complaint  Patient presents with   Routine Post Op    POV # 1 DOS 05/25/23 RT POSTERIOR TIBIAL TENDON LENGTHENING WITH ACHILLES TENDON LENGTHENING W/POSTERIOR TENODESIS    DOS: 05/25/2023 Procedure: Right posterior tibial tendon lengthening with Achilles tendinitis lengthening with peroneal tendon tenodesis  53 y.o. male returns for post-op check.  Patient states that he is doing well denies any other No Pain Nonweightbearing to the Right Lower Extremity.  Bandages Clean Dry and Intact  Review of Systems: Negative except as noted in the HPI. Denies N/V/F/Ch.  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   (06-17-2019 pt stated has been stable with no changes and followed by pcp)   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HCC)    11-06-2016  lower extremity angiography (dr Shaunna [place])  mil tibial disease w/ occlusion of the distal peroneal artery and dorsalis pedis artery but has widely patent posterior tibial and anterior tibial arterties   Type 2 diabetes mellitus treated with insulin  (HCC)    followed by pcp   (06-17-2019  pt stated checks blood sugar daily in am,  fasting sugar-- average 71)   Vitamin B 12 deficiency     Current Outpatient Medications:    ascorbic acid  (VITAMIN C ) 500 MG tablet, Take 500 mg by mouth daily., Disp: , Rfl:    aspirin  EC 81  MG tablet, Take 81 mg by mouth daily., Disp: , Rfl:    atorvastatin  (LIPITOR) 80 MG tablet, TAKE 1 TABLET BY MOUTH EVERY DAY, Disp: 90 tablet, Rfl: 3   Cyanocobalamin  (VITAMIN B-12) 5000 MCG SUBL, Place 5,000 mcg under the tongue daily., Disp: , Rfl:    fenofibrate  (TRICOR ) 48 MG tablet, TAKE 1 TABLET BY MOUTH EVERY DAY, Disp: 90 tablet, Rfl: 4   Ferrous Sulfate  (IRON PO), Take 1 tablet by mouth daily., Disp: , Rfl:    ibuprofen  (ADVIL ) 800 MG tablet, Take 1 tablet (800 mg total) by mouth every 6 (six) hours as needed., Disp: 60 tablet, Rfl: 1   insulin  aspart (NOVOLOG  FLEXPEN) 100 UNIT/ML FlexPen, Inject 3 Units into the skin 2 (two) times daily., Disp: 15 mL, Rfl: 11   insulin  degludec (TRESIBA  FLEXTOUCH) 200 UNIT/ML FlexTouch Pen, Inject 10 Units into the skin daily., Disp: 9 mL, Rfl: 3   Insulin  Pen Needle (B-D UF III MINI PEN NEEDLES) 31G X 5 MM MISC, USE TO INJECT INSULIN  DAILY. DX: E11.9, Disp: 100 each, Rfl: 11   Multiple Vitamin (MULTIVITAMIN WITH MINERALS) TABS tablet, Take 1 tablet by mouth daily., Disp: , Rfl:    Multiple Vitamins-Minerals (ZINC  PO), Take 1 tablet by mouth daily., Disp: , Rfl:    Omega-3 Fatty Acids (FISH OIL) 1360 MG CAPS, Take 1,360 mg by mouth daily. , Disp: , Rfl:  oxyCODONE -acetaminophen  (PERCOCET) 5-325 MG tablet, Take 1 tablet by mouth every 4 (four) hours as needed for severe pain (pain score 7-10)., Disp: 30 tablet, Rfl: 0   pyridoxine  (B-6) 100 MG tablet, Take 100 mg by mouth daily., Disp: , Rfl:    ramipril  (ALTACE ) 5 MG capsule, Take 1 capsule (5 mg total) by mouth daily., Disp: 90 capsule, Rfl: 3   tirzepatide  (MOUNJARO ) 5 MG/0.5ML Pen, INJECT 5 MG SUBCUTANEOUSLY WEEKLY, Disp: 6 mL, Rfl: 3  Social History   Tobacco Use  Smoking Status Never  Smokeless Tobacco Never    No Known Allergies Objective:  There were no vitals filed for this visit. There is no height or weight on file to calculate BMI. Constitutional Well developed. Well nourished.   Vascular Foot warm and well perfused. Capillary refill normal to all digits.   Neurologic Normal speech. Oriented to person, place, and time. Epicritic sensation to light touch grossly present bilaterally.  Dermatologic Skin healing well without signs of infection. Skin edges well coapted without signs of infection.  Orthopedic: Tenderness to palpation noted about the surgical site.   Radiographs: None Assessment:   1. Diabetic ulcer of right midfoot associated with type 2 diabetes mellitus, with fat layer exposed (HCC)   2. Posterior tibial tendon dysfunction   3. Equinus contracture of right ankle   4. Peroneal tendinitis of right lower extremity   5. Status post foot surgery    Plan:  Patient was evaluated and treated and all questions answered.  S/p foot surgery right -Progressing as expected post-operatively. -XR: See above -WB Status: Weightbearing in right lower extremity knee scooter -Sutures: Intact.  No clinical signs of dehiscence noted no complication noted. -Medications: None -Foot redressed. - Patient will need a suture removal/staple removal during next visit.  He can begin to weightbearing as tolerated slowly in the cam boot starting next week  No follow-ups on file.

## 2023-06-10 ENCOUNTER — Encounter: Admitting: Podiatry

## 2023-06-17 ENCOUNTER — Ambulatory Visit (INDEPENDENT_AMBULATORY_CARE_PROVIDER_SITE_OTHER): Admitting: Podiatry

## 2023-06-17 DIAGNOSIS — Z9889 Other specified postprocedural states: Secondary | ICD-10-CM

## 2023-06-17 NOTE — Progress Notes (Signed)
 Chief Complaint  Patient presents with   Routine Post Op    RM 9 POV # 2 DOS 05/25/23 RT POSTERIOR TIBIAL TENDON LENGTHENING WITH ACHILLES TENDON LENGTHENING W/POSTERIOR TENODESIS ( Dr.Patel Pt).     Subjective:  Patient presents today status post Achilles tendon and posterior tibial tendon augmentation with tenodesis.  DOS: 05/25/2023.  Dr. Tinnie Forehand.  Currently NWB in the cam boot using the scooter  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   (06-17-2019 pt stated has been stable with no changes and followed by pcp)   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HCC)    11-06-2016  lower extremity angiography (dr Shaunna [place])  mil tibial disease w/ occlusion of the distal peroneal artery and dorsalis pedis artery but has widely patent posterior tibial and anterior tibial arterties   Type 2 diabetes mellitus treated with insulin  (HCC)    followed by pcp   (06-17-2019  pt stated checks blood sugar daily in am,  fasting sugar-- average 71)   Vitamin B 12 deficiency     Past Surgical History:  Procedure Laterality Date   AMPUTATION Right 07/31/2016   Procedure: AMPUTATION RIGHT FIFTH RAY AND APPLICATION OF WOUND VAC;  Surgeon: Neil Balls, MD;  Location: [facility];  Service: Orthopedics;  Laterality: Right;   AMPUTATION Right 08/08/2016   Procedure: RAY AMPUTATION RIGHT FOURTH METATARSAL, DEBRIDEMENT OF SUBQ TISSUE,BONE AND SKIN WITH WOUND CLOSURE;   Surgeon: Neil Balls, MD;  Location: [facility];  Service: Orthopedics;  Laterality: Right;   AMPUTATION TOE Left 06/21/2019   Procedure: AMPUTATION TOE SECOND LEFT, DEBRIDEMENT OF LEFT GREAT TOE;  Surgeon: Camilo Cella, DPM;  Location: [facility];  Service: Podiatry;  Laterality: Left;   AMPUTATION TOE Right 09/03/2020   Procedure: AMPUTATION TOE;  Surgeon: Camilo Cella, DPM;  Location: [facility];  Service: Podiatry;  Laterality: Right;   AMPUTATION TOE Left 02/27/2023   Procedure: AMPUTATION TOE;  Surgeon: Jennefer Moats, DPM;  Location: [facility];  Service: Orthopedics/Podiatry;  Laterality: Left;   ANAL FISSURE REPAIR  2000   APPLICATION OF A-CELL OF EXTREMITY Right 08/13/2016   Procedure: APPLICATION OF A-CELL AND VAC;  Surgeon: Thornell Flirt, DO;  Location: [facility];  Service: Plastics;  Laterality: Right;   APPLICATION OF A-CELL OF EXTREMITY Right 07/16/2017   Procedure: CELLERATE PLACEMENT;  Surgeon: Thornell Flirt, DO;  Location: [facility];  Service: Plastics;  Laterality: Right;   APPLICATION OF WOUND VAC Right 09/18/2016   Procedure: APPLICATION OF WOUND VAC (PT. WILL BRING HIS OWN);  Surgeon: Thornell Flirt, DO;  Location: [facility];  Service: Plastics;  Laterality: Right;   APPLICATION OF WOUND VAC Right 10/30/2016   Procedure: APPLICATION OF WOUND VAC;  Surgeon: Thornell Flirt, DO;  Location: [facility];  Service: Plastics;  Laterality:  Right;   APPLICATION OF WOUND VAC Right 12/01/2016   Procedure: APPLICATION OF WOUND VAC;  Surgeon: Thornell Flirt, DO;  Location: [facility];  Service: Plastics;  Laterality: Right;   BONE BIOPSY Left 01/04/2018   Procedure: LEFT FOOT BONE BIOPSY;  Surgeon: Camilo Cella, DPM;  [facility];  Service: Podiatry;  Laterality: Left;   BONE BIOPSY Right 09/03/2020   Procedure: BONE BIOPSY;  Surgeon: Camilo Cella, DPM;  Location: [facility];  Service: Podiatry;   Laterality: Right;   CIRCUMCISION  2006   COLONOSCOPY  04/08/2021   DEBRIDEMENT  FOOT Right 07/16/2017   ESOPHAGOGASTRODUODENOSCOPY (EGD) WITH PROPOFOL  N/A 11/07/2021   Procedure: ESOPHAGOGASTRODUODENOSCOPY (EGD) WITH PROPOFOL ;  Surgeon: Annis Kinder, DO;  [facility];  Service: Gastroenterology;  Laterality: N/A;   GIVENS CAPSULE STUDY N/A 11/07/2021   Procedure: GIVENS CAPSULE STUDY;  Surgeon: Annis Kinder, DO;  [facility];  Service: Gastroenterology;  Laterality: N/A;   I & D EXTREMITY Right 09/18/2016   Procedure: IRRIGATION AND DEBRIDEMENT OF RIGHT LATERAL DIABLTIC FOOT ULCER;  Surgeon: Thornell Flirt, DO;  Location: [facility];  Service: Plastics;  Laterality: Right;   I & D EXTREMITY Right 03/11/2017   Procedure: IRRIGATION AND DEBRIDEMENT OF RIGHT LATERAL FOOT WOUND WITH CELLERATE COLLAGEN;  Surgeon: Thornell Flirt, DO;  Location: [facility];  Service: Plastics;  Laterality: Right;   INCISION AND DRAINAGE OF WOUND Right 08/13/2016   Procedure: IRRIGATION AND DEBRIDEMENT WOUND RIGHT FOOT;  Surgeon: Thornell Flirt, DO;  Location: [facility];  Service: Plastics;  Laterality: Right;   IR PATIENT EVAL TECH 0-60 MINS  01/12/2020   LAPAROSCOPIC CHOLECYSTECTOMY  09-09-2006   dr Lynnell Sato   LOWER EXTREMITY ANGIOGRAPHY N/A 10/27/2016   Procedure: Lower Extremity Angiography;  Surgeon: Dannis Dy, MD;  Location: [facility] INVAS[facility] CV LAB;  Service: Cardiovascular;  Laterality: N/A;   MASS EXCISION Right 10/30/2016   Procedure: EXCISION RIGHT FOOT WOUND WITH VAC PLACEMENT;  Surgeon: Thornell Flirt, DO;  Location: [facility];  Service: Plastics;  Laterality: Right;   MASS EXCISION Right 07/16/2017   Procedure: EXCISION OF RIGHT FOOT WOUND WITH CELLERATE PLACEMENT;  Surgeon: Thornell Flirt, DO;  Location: [facility];  Service: Plastics;  Laterality: Right;   METATARSAL  HEAD EXCISION Left 01/04/2018   Procedure: 5TH METATARSAL RESECTION,  WOUND CLOSURE,  Adjacent Tissue Transfer.;  Surgeon: Camilo Cella, DPM;  Location[facility];  Service: Podiatry;  Laterality: Left;   SKIN SPLIT GRAFT Right 12/01/2016   Procedure: SKIN GRAFT SPLIT THICKNESS TO RIGHT LATERAL FOOT WOUND 9 X 3 CM;  Surgeon: Thornell Flirt, DO;  Location: [facility];  Service: Plastics;  Laterality: Right;   wisdom teeth extracted  1984   WOUND DEBRIDEMENT Right 09/03/2020   Procedure: DEBRIDEMENT WOUND, INSERTION OF ANTIBIOTIC BEADS;  Surgeon: Camilo Cella, DPM;  Location: [facility];  Service: Podiatry;  Laterality: Right;    No Known Allergies  Objective/Physical Exam Skin is warm to touch..  Incision well coapted with staples intact. No sign of infectious process noted. No dehiscence. No active bleeding noted.  Moderate edema noted to the surgical extremity.  History of prior toe amputation and surgery right foot   Assessment: 1. s/p posterior tibial tendon lengthening with Achilles tendon lengthening. DOS: 05/25/2023.  Dr. Tinnie Forehand   Plan of Care:  -Patient was evaluated. X-rays reviewed - Staples removed -Continue NWB CAM boot -  Continue Ace wrap daily -Return to clinic 4 weeks with Dr. Arcadio Knuckles, DPM [facility]  Dr. Dot Gazella, DPM    [address] [place], [place] [address]                Office [phone]  Fax [phone]

## 2023-06-18 DIAGNOSIS — Z0271 Encounter for disability determination: Secondary | ICD-10-CM

## 2023-06-18 NOTE — Telephone Encounter (Signed)
 Faxed [employer] forms/notes to [phone]. RTW 08/25/23

## 2023-06-25 NOTE — Telephone Encounter (Signed)
 See below

## 2023-07-17 ENCOUNTER — Ambulatory Visit (INDEPENDENT_AMBULATORY_CARE_PROVIDER_SITE_OTHER): Admitting: Podiatry

## 2023-07-17 DIAGNOSIS — Z9889 Other specified postprocedural states: Secondary | ICD-10-CM

## 2023-07-17 NOTE — Progress Notes (Signed)
 Chief Complaint  Patient presents with   Routine Post Op    DOS 05/25/23 RT POSTERIOR TIBIAL TENDON LENGTHENING WITH ACHILLES TENDON LENGTHENING W/POSTERIOR TENODESIS    Subjective:  Patient presents today status post Achilles tendon and posterior tibial tendon augmentation with tenodesis.  He is doing well no acute complaints weightbearing as tolerated with Cam boot.  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   (06-17-2019 pt stated has been stable with no changes and followed by pcp)   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HCC)    11-06-2016  lower extremity angiography (dr eliza)  mil tibial disease w/ occlusion of the distal peroneal artery and dorsalis pedis artery but has widely patent posterior tibial and anterior tibial arterties   Type 2 diabetes mellitus treated with insulin  (HCC)    followed by pcp   (06-17-2019  pt stated checks blood sugar daily in am,  fasting sugar-- average 71)   Vitamin B 12 deficiency     Past Surgical History:  Procedure Laterality Date   AMPUTATION Right 07/31/2016   Procedure: AMPUTATION RIGHT FIFTH RAY AND APPLICATION OF WOUND VAC;  Surgeon: Yvone Rush, MD;  [facility]: [facility];  Service: Orthopedics;  Laterality: Right;   AMPUTATION Right 08/08/2016   Procedure: RAY AMPUTATION RIGHT FOURTH METATARSAL, DEBRIDEMENT OF SUBQ TISSUE,BONE AND SKIN WITH WOUND CLOSURE;  Surgeon: Yvone Rush, MD;  Location:  [facility];  Service: Orthopedics;  Laterality: Right;   AMPUTATION TOE Left 06/21/2019   Procedure: AMPUTATION TOE SECOND LEFT, DEBRIDEMENT OF LEFT GREAT TOE;  Surgeon: Gretel Ozell PARAS, DPM;  [facility];  Service: Podiatry;  Laterality: Left;   AMPUTATION TOE Right 09/03/2020   Procedure: AMPUTATION TOE;  Surgeon: Gretel Ozell PARAS, DPM;  Location: [facility];  Service: Podiatry;  Laterality: Right;   AMPUTATION TOE Left 02/27/2023   Procedure: AMPUTATION TOE;  Surgeon: Joya Stabs, DPM;  Location: [facility];  Service: Orthopedics/Podiatry;  Laterality: Left;   ANAL FISSURE REPAIR  2000   APPLICATION OF A-CELL OF EXTREMITY Right 08/13/2016   Procedure: APPLICATION OF A-CELL AND VAC;  Surgeon: Lowery Estefana RAMAN, DO;  Location: [facility];  Service: Plastics;  Laterality: Right;   APPLICATION OF A-CELL OF EXTREMITY Right 07/16/2017   Procedure: CELLERATE PLACEMENT;  Surgeon: Lowery Estefana RAMAN, DO;  Location: [facility];  Service: Plastics;  Laterality: Right;   APPLICATION OF WOUND VAC Right 09/18/2016   Procedure: APPLICATION OF WOUND VAC (PT. WILL BRING HIS OWN);  Surgeon: Lowery Estefana RAMAN, DO;  Location: [facility];  Service: Plastics;  Laterality: Right;   APPLICATION OF WOUND VAC Right 10/30/2016   Procedure: APPLICATION OF WOUND VAC;  Surgeon: Lowery Estefana RAMAN, DO;  Location: [facility];  Service: Plastics;  Laterality: Right;   APPLICATION OF WOUND VAC Right 12/01/2016   Procedure:  APPLICATION OF WOUND VAC;  Surgeon: Lowery Estefana RAMAN, DO;  Location: [facility];  Service: Plastics;  Laterality: Right;   BONE BIOPSY Left 01/04/2018   Procedure: LEFT FOOT BONE BIOPSY;  Surgeon: Gretel Ozell PARAS, DPM;  [facility];  Service: Podiatry;  Laterality: Left;   BONE BIOPSY Right 09/03/2020   Procedure: BONE BIOPSY;  Surgeon: Gretel Ozell PARAS, DPM;  Location: [facility];  Service: Podiatry;  Laterality: Right;   CIRCUMCISION   2006   COLONOSCOPY  04/08/2021   DEBRIDEMENT  FOOT Right 07/16/2017   ESOPHAGOGASTRODUODENOSCOPY (EGD) WITH PROPOFOL  N/A 11/07/2021   Procedure: ESOPHAGOGASTRODUODENOSCOPY (EGD) WITH PROPOFOL ;  Surgeon: San Sandor GAILS, DO;  [facility];  Service: Gastroenterology;  Laterality: N/A;   GIVENS CAPSULE STUDY N/A 11/07/2021   Procedure: GIVENS CAPSULE STUDY;  Surgeon: San Sandor GAILS, DO;  [facility];  Service: Gastroenterology;  Laterality: N/A;   I & D EXTREMITY Right 09/18/2016   Procedure: IRRIGATION AND DEBRIDEMENT OF RIGHT LATERAL DIABLTIC FOOT ULCER;  Surgeon: Lowery Estefana RAMAN, DO;  Location: [facility];  Service: Plastics;  Laterality: Right;   I & D EXTREMITY Right 03/11/2017   Procedure: IRRIGATION AND DEBRIDEMENT OF RIGHT LATERAL FOOT WOUND WITH CELLERATE COLLAGEN;  Surgeon: Lowery Estefana RAMAN, DO;  Location: [facility];  Service: Plastics;  Laterality: Right;   INCISION AND DRAINAGE OF WOUND Right 08/13/2016   Procedure: IRRIGATION AND DEBRIDEMENT WOUND RIGHT FOOT;  Surgeon: Lowery Estefana RAMAN, DO;  Location: [facility];  Service: Plastics;  Laterality: Right;   IR PATIENT EVAL TECH 0-60 MINS  01/12/2020   LAPAROSCOPIC CHOLECYSTECTOMY  09-09-2006   dr lily   LOWER EXTREMITY ANGIOGRAPHY N/A 10/27/2016   Procedure: Lower Extremity Angiography;  Surgeon: Eliza Lonni RAMAN, MD;  Location: [facility];  Service: Cardiovascular;  Laterality: N/A;   MASS EXCISION Right 10/30/2016   Procedure: EXCISION RIGHT FOOT WOUND WITH VAC PLACEMENT;  Surgeon: Lowery Estefana RAMAN, DO;  Location: [facility];  Service: Plastics;  Laterality: Right;   MASS EXCISION Right 07/16/2017   Procedure: EXCISION OF RIGHT FOOT WOUND WITH CELLERATE PLACEMENT;  Surgeon: Lowery Estefana RAMAN, DO;  Location: [facility];  Service: Plastics;  Laterality: Right;   METATARSAL HEAD EXCISION Left 01/04/2018    Procedure: 5TH METATARSAL RESECTION,  WOUND CLOSURE,  Adjacent Tissue Transfer.;  Surgeon: Gretel Ozell PARAS, DPM;  Location: [facility];  Service: Podiatry;  Laterality: Left;   SKIN SPLIT GRAFT Right 12/01/2016   Procedure: SKIN GRAFT SPLIT THICKNESS TO RIGHT LATERAL FOOT WOUND 9 X 3 CM;  Surgeon: Lowery Estefana RAMAN, DO;  Location: [facility];  Service: Plastics;  Laterality: Right;   wisdom teeth extracted  1984   WOUND DEBRIDEMENT Right 09/03/2020   Procedure: DEBRIDEMENT WOUND, INSERTION OF ANTIBIOTIC BEADS;  Surgeon: Gretel Ozell PARAS, DPM;  Location: [facility];  Service: Podiatry;  Laterality: Right;    No Known Allergies  Objective/Physical Exam Skin is warm to touch..  Incision well coapted with staples intact. No sign of infectious process noted. No dehiscence. No active bleeding noted.  Moderate edema noted to the surgical extremity.  History of prior toe amputation and surgery right foot   Assessment: 1. s/p posterior tibial tendon lengthening with Achilles tendon lengthening. DOS: 05/25/2023.  Dr. Franky Blanch   Plan of Care:  -Patient was evaluated. X-rays reviewed - Clinically doing much better.  Return to regular shoes in 1 week.  Weightbearing as tolerated in cam  boot for 1 week followed by transition to regular shoes I will see him back again in 4 weeks and if he is doing well with a final postop. - His ulceration has completely healed.

## 2023-08-14 ENCOUNTER — Ambulatory Visit (INDEPENDENT_AMBULATORY_CARE_PROVIDER_SITE_OTHER): Admitting: Podiatry

## 2023-08-14 DIAGNOSIS — Z9889 Other specified postprocedural states: Secondary | ICD-10-CM

## 2023-08-14 NOTE — Progress Notes (Signed)
 Chief Complaint  Patient presents with   Routine Post Op    POV #4 DOS 05/25/23 RT POSTERIOR TIBIAL TENDON LENGTHENING WITH ACHILLES TENDON LENGTHENING W/POSTERIOR TENODESIS Pt stated that he is doing well     Subjective:  Patient presents today status post Achilles tendon and posterior tibial tendon augmentation with tenodesis.  He is doing well no acute complaints weightbearing as tolerated with Cam boot.  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   (06-17-2019 pt stated has been stable with no changes and followed by pcp)   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HCC)    11-06-2016  lower extremity angiography (dr eliza)  mil tibial disease w/ occlusion of the distal peroneal artery and dorsalis pedis artery but has widely patent posterior tibial and anterior tibial arterties   Type 2 diabetes mellitus treated with insulin  (HCC)    followed by pcp   (06-17-2019  pt stated checks blood sugar daily in am,  fasting sugar-- average 71)   Vitamin B 12 deficiency     Past Surgical History:  Procedure Laterality Date   AMPUTATION Right 07/31/2016   Procedure: AMPUTATION RIGHT FIFTH RAY AND APPLICATION OF WOUND VAC;  Surgeon: Yvone Rush, MD;  Location: [facility];  Service: Orthopedics;  Laterality: Right;   AMPUTATION Right 08/08/2016   Procedure: RAY AMPUTATION RIGHT FOURTH METATARSAL, DEBRIDEMENT OF SUBQ TISSUE,BONE AND SKIN WITH WOUND CLOSURE;   Surgeon: Yvone Rush, MD;  Location: [facility];  Service: Orthopedics;  Laterality: Right;   AMPUTATION TOE Left 06/21/2019   Procedure: AMPUTATION TOE SECOND LEFT, DEBRIDEMENT OF LEFT GREAT TOE;  Surgeon: Gretel Ozell PARAS, DPM;  Location: [facility];  Service: Podiatry;  Laterality: Left;   AMPUTATION TOE Right 09/03/2020   Procedure: AMPUTATION TOE;  Surgeon: Gretel Ozell PARAS, DPM;  Location: [facility];  Service: Podiatry;  Laterality: Right;   AMPUTATION TOE Left 02/27/2023   Procedure: AMPUTATION TOE;  Surgeon: Joya Stabs, DPM;  Location: [facility];  Service: Orthopedics/Podiatry;  Laterality: Left;   ANAL FISSURE REPAIR  2000   APPLICATION OF A-CELL OF EXTREMITY Right 08/13/2016   Procedure: APPLICATION OF A-CELL AND VAC;  Surgeon: Lowery Estefana RAMAN, DO;  Location: [facility];  Service: Plastics;  Laterality: Right;   APPLICATION OF A-CELL OF EXTREMITY Right 07/16/2017   Procedure: CELLERATE PLACEMENT;  Surgeon: Lowery Estefana RAMAN, DO;  Location: [facility];  Service: Plastics;  Laterality: Right;   APPLICATION OF WOUND VAC Right 09/18/2016   Procedure: APPLICATION OF WOUND VAC (PT. WILL BRING HIS OWN);  Surgeon: Lowery Estefana RAMAN, DO;  Location: [facility];  Service: Plastics;  Laterality: Right;   APPLICATION OF WOUND VAC Right 10/30/2016   Procedure: APPLICATION OF WOUND VAC;  Surgeon: Lowery Estefana RAMAN, DO;  Location: [facility];  Service: Plastics;  Laterality: Right;  APPLICATION OF WOUND VAC Right 12/01/2016   Procedure: APPLICATION OF WOUND VAC;  Surgeon: Lowery Estefana RAMAN, DO;  Location: [facility];  Service: Plastics;  Laterality: Right;   BONE BIOPSY Left 01/04/2018   Procedure: LEFT FOOT BONE BIOPSY;  Surgeon: Gretel Ozell PARAS, DPM;  Location: [facility];  Service: Podiatry;  Laterality: Left;   BONE BIOPSY Right 09/03/2020   Procedure: BONE BIOPSY;  Surgeon: Gretel Ozell PARAS, DPM;  Location: [facility];  Service:  Podiatry;  Laterality: Right;   CIRCUMCISION  2006   COLONOSCOPY  04/08/2021   DEBRIDEMENT  FOOT Right 07/16/2017   ESOPHAGOGASTRODUODENOSCOPY (EGD) WITH PROPOFOL  N/A 11/07/2021   Procedure: ESOPHAGOGASTRODUODENOSCOPY (EGD) WITH PROPOFOL ;  Surgeon: San Sandor GAILS, DO;  Location: [facility];  Service: Gastroenterology;  Laterality: N/A;   GIVENS CAPSULE STUDY N/A 11/07/2021   Procedure: GIVENS CAPSULE STUDY;  Surgeon: San Sandor GAILS, DO;  Location: [facility];  Service: Gastroenterology;  Laterality: N/A;   I & D EXTREMITY Right 09/18/2016   Procedure: IRRIGATION AND DEBRIDEMENT OF RIGHT LATERAL DIABLTIC FOOT ULCER;  Surgeon: Lowery Estefana RAMAN, DO;  Location: [facility];  Service: Plastics;  Laterality: Right;   I & D EXTREMITY Right 03/11/2017   Procedure: IRRIGATION AND DEBRIDEMENT OF RIGHT LATERAL FOOT WOUND WITH CELLERATE COLLAGEN;  Surgeon: Lowery Estefana RAMAN, DO;  Location: [facility];  Service: Plastics;  Laterality: Right;   INCISION AND DRAINAGE OF WOUND Right 08/13/2016   Procedure: IRRIGATION AND DEBRIDEMENT WOUND RIGHT FOOT;  Surgeon: Lowery Estefana RAMAN, DO;  Location: [facility];  Service: Plastics;  Laterality: Right;   IR PATIENT EVAL TECH 0-60 MINS  01/12/2020   LAPAROSCOPIC CHOLECYSTECTOMY  09-09-2006   dr lily   LOWER EXTREMITY ANGIOGRAPHY N/A 10/27/2016   Procedure: Lower Extremity Angiography;  Surgeon: Eliza Lonni RAMAN, MD;  Location: [facility];  Service: Cardiovascular;  Laterality: N/A;   MASS EXCISION Right 10/30/2016   Procedure: EXCISION RIGHT FOOT WOUND WITH VAC PLACEMENT;  Surgeon: Lowery Estefana RAMAN, DO;  Location: [facility];  Service: Plastics;  Laterality: Right;   MASS EXCISION Right 07/16/2017   Procedure: EXCISION OF RIGHT FOOT WOUND WITH CELLERATE PLACEMENT;  Surgeon: Lowery Estefana RAMAN, DO;  Location: [facility];  Service: Plastics;  Laterality: Right;    METATARSAL HEAD EXCISION Left 01/04/2018   Procedure: 5TH METATARSAL RESECTION,  WOUND CLOSURE,  Adjacent Tissue Transfer.;  Surgeon: Gretel Ozell PARAS, DPM;  Location: [facility];  Service: Podiatry;  Laterality: Left;   SKIN SPLIT GRAFT Right 12/01/2016   Procedure: SKIN GRAFT SPLIT THICKNESS TO RIGHT LATERAL FOOT WOUND 9 X 3 CM;  Surgeon: Lowery Estefana RAMAN, DO;  Location: [facility];  Service: Plastics;  Laterality: Right;   wisdom teeth extracted  1984   WOUND DEBRIDEMENT Right 09/03/2020   Procedure: DEBRIDEMENT WOUND, INSERTION OF ANTIBIOTIC BEADS;  Surgeon: Gretel Ozell PARAS, DPM;  Location: [facility];  Service: Podiatry;  Laterality: Right;    No Known Allergies  Objective/Physical Exam Skin is warm to touch..  Incision well coapted with staples intact. No sign of infectious process noted. No dehiscence. No active bleeding noted.  Moderate edema noted to the surgical extremity.  History of prior toe amputation and surgery right foot   Assessment: 1. s/p posterior tibial tendon lengthening with Achilles tendon lengthening. DOS: 05/25/2023.  Dr. Franky Blanch   Plan of Care:  -Patient was evaluated. X-rays reviewed - Clinically doing much better.  He has returned to  regular shoes.  The wound still continues to be reepithelialized.  At this time he can return to work without any restrictions and if any foot and ankle issues on future he will come back and see me. - His ulceration has completely healed.

## 2023-09-03 ENCOUNTER — Ambulatory Visit (INDEPENDENT_AMBULATORY_CARE_PROVIDER_SITE_OTHER): Admitting: Podiatry

## 2023-09-03 DIAGNOSIS — L97412 Non-pressure chronic ulcer of right heel and midfoot with fat layer exposed: Secondary | ICD-10-CM

## 2023-09-03 DIAGNOSIS — E11621 Type 2 diabetes mellitus with foot ulcer: Secondary | ICD-10-CM

## 2023-09-03 NOTE — Progress Notes (Unsigned)
 Subjective:  Patient ID: [name], male    DOB: 19-Nov-1970,  MRN: [id number]  Chief Complaint  Patient presents with   Wound Check    53 y.o. male presents for wound care.  Patient presents with right lateral hindfoot ulceration.  He states started opening back up and noted some drainage wanted to come and get it evaluated denies any other acute complaints.     Review of Systems: Negative except as noted in the HPI. Denies N/V/F/Ch.  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   (06-17-2019 pt stated has been stable with no changes and followed by pcp)   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HCC)    11-06-2016  lower extremity angiography (dr eliza)  mil tibial disease w/ occlusion of the distal peroneal artery and dorsalis pedis artery but has widely patent posterior tibial and anterior tibial arterties   Type 2 diabetes mellitus treated with insulin (HCC)    followed by pcp   (06-17-2019  pt stated checks blood sugar daily in am,  fasting sugar-- average 71)   Vitamin B 12 deficiency     Current Outpatient Medications:    ascorbic acid (VITAMIN C) 500 MG tablet, Take 500 mg by mouth daily., Disp: , Rfl:    aspirin EC 81 MG tablet, Take 81 mg by mouth daily., Disp: , Rfl:    atorvastatin (LIPITOR) 80 MG tablet, TAKE 1 TABLET BY MOUTH EVERY DAY, Disp: 90 tablet, Rfl: 3   Cyanocobalamin (VITAMIN B-12) 5000 MCG SUBL, Place 5,000  mcg under the tongue daily., Disp: , Rfl:    fenofibrate (TRICOR) 48 MG tablet, TAKE 1 TABLET BY MOUTH EVERY DAY, Disp: 90 tablet, Rfl: 4   Ferrous Sulfate (IRON PO), Take 1 tablet by mouth daily., Disp: , Rfl:    ibuprofen (ADVIL) 800 MG tablet, Take 1 tablet (800 mg total) by mouth every 6 (six) hours as needed., Disp: 60 tablet, Rfl: 1   insulin aspart (NOVOLOG FLEXPEN) 100 UNIT/ML FlexPen, Inject 3 Units into the skin 2 (two) times daily., Disp: 15 mL, Rfl: 11   insulin degludec (TRESIBA FLEXTOUCH) 200 UNIT/ML FlexTouch Pen, Inject 10 Units into the skin daily., Disp: 9 mL, Rfl: 3   Insulin Pen Needle (B-D UF III MINI PEN NEEDLES) 31G X 5 MM MISC, USE TO INJECT INSULIN DAILY. DX: E11.9, Disp: 100 each, Rfl: 11   Multiple Vitamin (MULTIVITAMIN WITH MINERALS) TABS tablet, Take 1 tablet by mouth daily., Disp: , Rfl:    Multiple Vitamins-Minerals (ZINC PO), Take 1 tablet by mouth daily., Disp: , Rfl:    Omega-3 Fatty Acids (FISH OIL) 1360 MG CAPS, Take 1,360 mg by mouth daily. , Disp: , Rfl:    oxyCODONE-acetaminophen (PERCOCET) 5-325 MG tablet, Take 1 tablet by mouth every 4 (four) hours as needed for severe pain (pain score 7-10)., Disp: 30 tablet,  Rfl: 0   pyridoxine (B-6) 100 MG tablet, Take 100 mg by mouth daily., Disp: , Rfl:    ramipril (ALTACE) 5 MG capsule, Take 1 capsule (5 mg total) by mouth daily., Disp: 90 capsule, Rfl: 3   tirzepatide (MOUNJARO) 5 MG/0.5ML Pen, INJECT 5 MG SUBCUTANEOUSLY WEEKLY, Disp: 6 mL, Rfl: 3  Social History   Tobacco Use  Smoking Status Never  Smokeless Tobacco Never    No Known Allergies Objective:  There were no vitals filed for this visit. There is no height or weight on file to calculate BMI. Constitutional Well developed. Well nourished.  Vascular Dorsalis pedis pulses palpable bilaterally. Posterior tibial pulses palpable bilaterally. Capillary refill normal to all digits.  No cyanosis or clubbing noted. Pedal hair growth normal.  Neurologic  Normal speech. Oriented to person, place, and time. Protective sensation absent  Dermatologic Wound Location: Right lateral hindfoot with fat layer exposed.  Does not expose any bone.  No purulent drainage noted no malodor present Wound Base: Mixed Granular/Fibrotic Peri-wound: Calloused Exudate: Scant/small amount Serous exudate Wound Measurements: -See below  Orthopedic: No pain to palpation either foot.   Radiographs: None Assessment:   1. Diabetic ulcer of right midfoot associated with type 2 diabetes mellitus, with fat layer exposed (HCC)     Plan:  Patient was evaluated and treated and all questions answered.  Ulcer right lateral hindfoot ulcer -Debridement as below. -Dressed with Betadine wet-to-dry, DSD. -Continue off-loading with surgical shoe.  Procedure: Excisional Debridement of Wound Tool: Sharp chisel blade/tissue nipper Rationale: Removal of non-viable soft tissue from the wound to promote healing.  Anesthesia: none Pre-Debridement Wound Measurements: 0.3 cm x 0 point 3 cm x 0.3 cm  Post-Debridement Wound Measurements: 0.5 cm x 0.5 cm x 0.3 cm Type of Debridement: Sharp Excisional Tissue Removed: Non-viable soft tissue Blood loss: Minimal (<50cc) Depth of Debridement: subcutaneous tissue. Technique: Sharp excisional debridement to bleeding, viable wound base.  Wound Progress: This is my initial evaluation of continue monitor the progression of the wound Dressing: Dry, sterile, compression dressing. Disposition: Patient tolerated procedure well. Patient to return in 1 week for follow-up.  No follow-ups on file.

## 2023-09-11 ENCOUNTER — Other Ambulatory Visit: Payer: Self-pay | Admitting: Podiatry

## 2023-09-30 ENCOUNTER — Ambulatory Visit (INDEPENDENT_AMBULATORY_CARE_PROVIDER_SITE_OTHER): Admitting: Podiatry

## 2023-09-30 ENCOUNTER — Ambulatory Visit: Payer: Self-pay | Admitting: Family Medicine

## 2023-09-30 ENCOUNTER — Ambulatory Visit: Admitting: Family Medicine

## 2023-09-30 ENCOUNTER — Encounter: Payer: Self-pay | Admitting: Family Medicine

## 2023-09-30 VITALS — BP 136/82 | HR 77 | Temp 98.4°F | Ht 72.0 in | Wt 296.0 lb

## 2023-09-30 DIAGNOSIS — Z7984 Long term (current) use of oral hypoglycemic drugs: Secondary | ICD-10-CM

## 2023-09-30 DIAGNOSIS — E538 Deficiency of other specified B group vitamins: Secondary | ICD-10-CM | POA: Diagnosis not present

## 2023-09-30 DIAGNOSIS — E1149 Type 2 diabetes mellitus with other diabetic neurological complication: Secondary | ICD-10-CM

## 2023-09-30 DIAGNOSIS — E11621 Type 2 diabetes mellitus with foot ulcer: Secondary | ICD-10-CM

## 2023-09-30 DIAGNOSIS — E1169 Type 2 diabetes mellitus with other specified complication: Secondary | ICD-10-CM | POA: Diagnosis not present

## 2023-09-30 DIAGNOSIS — D352 Benign neoplasm of pituitary gland: Secondary | ICD-10-CM

## 2023-09-30 DIAGNOSIS — Z23 Encounter for immunization: Secondary | ICD-10-CM

## 2023-09-30 DIAGNOSIS — E785 Hyperlipidemia, unspecified: Secondary | ICD-10-CM

## 2023-09-30 DIAGNOSIS — D509 Iron deficiency anemia, unspecified: Secondary | ICD-10-CM

## 2023-09-30 DIAGNOSIS — I10 Essential (primary) hypertension: Secondary | ICD-10-CM

## 2023-09-30 DIAGNOSIS — L97412 Non-pressure chronic ulcer of right heel and midfoot with fat layer exposed: Secondary | ICD-10-CM | POA: Diagnosis not present

## 2023-09-30 LAB — CBC WITH DIFFERENTIAL/PLATELET
Basophils Absolute: 0 K/uL (ref 0.0–0.1)
Basophils Relative: 0.2 % (ref 0.0–3.0)
Eosinophils Absolute: 0.1 K/uL (ref 0.0–0.7)
Eosinophils Relative: 1.2 % (ref 0.0–5.0)
HCT: 40.3 % (ref 39.0–52.0)
Hemoglobin: 13.2 g/dL (ref 13.0–17.0)
Lymphocytes Relative: 23.4 % (ref 12.0–46.0)
Lymphs Abs: 2.1 K/uL (ref 0.7–4.0)
MCHC: 32.8 g/dL (ref 30.0–36.0)
MCV: 79.6 fl (ref 78.0–100.0)
Monocytes Absolute: 0.5 K/uL (ref 0.1–1.0)
Monocytes Relative: 5.2 % (ref 3.0–12.0)
Neutro Abs: 6.3 K/uL (ref 1.4–7.7)
Neutrophils Relative %: 70 % (ref 43.0–77.0)
Platelets: 333 K/uL (ref 150.0–400.0)
RBC: 5.06 Mil/uL (ref 4.22–5.81)
RDW: 13.8 % (ref 11.5–15.5)
WBC: 9 K/uL (ref 4.0–10.5)

## 2023-09-30 LAB — COMPREHENSIVE METABOLIC PANEL WITH GFR
ALT: 41 U/L (ref 0–53)
AST: 25 U/L (ref 0–37)
Albumin: 4.4 g/dL (ref 3.5–5.2)
Alkaline Phosphatase: 70 U/L (ref 39–117)
BUN: 19 mg/dL (ref 6–23)
CO2: 29 meq/L (ref 19–32)
Calcium: 10.3 mg/dL (ref 8.4–10.5)
Chloride: 105 meq/L (ref 96–112)
Creatinine, Ser: 1.36 mg/dL (ref 0.40–1.50)
GFR: 59.43 mL/min — ABNORMAL LOW (ref >60.00)
Glucose, Bld: 104 mg/dL — ABNORMAL HIGH (ref 70–99)
Potassium: 4.4 meq/L (ref 3.5–5.1)
Sodium: 141 meq/L (ref 135–145)
Total Bilirubin: 0.9 mg/dL (ref 0.2–1.2)
Total Protein: 8.3 g/dL (ref 6.0–8.3)

## 2023-09-30 LAB — IBC + FERRITIN
Ferritin: 362.2 ng/mL — ABNORMAL HIGH (ref 22.0–322.0)
Iron: 71 ug/dL (ref 42–165)
Saturation Ratios: 22 % (ref 20.0–50.0)
TIBC: 322 ug/dL (ref 250.0–450.0)
Transferrin: 230 mg/dL (ref 212.0–360.0)

## 2023-09-30 LAB — PROLACTIN: Prolactin: 5.6 ng/mL (ref 2.0–18.0)

## 2023-09-30 LAB — HEMOGLOBIN A1C: Hgb A1c MFr Bld: 6.2 % (ref 4.6–6.5)

## 2023-09-30 LAB — MICROALBUMIN / CREATININE URINE RATIO
Creatinine,U: 212.2 mg/dL
Microalb Creat Ratio: 17 mg/g (ref 0.0–30.0)
Microalb, Ur: 3.6 mg/dL — ABNORMAL HIGH (ref 0.0–1.9)

## 2023-09-30 LAB — VITAMIN B12: Vitamin B-12: 897 pg/mL (ref 211–911)

## 2023-09-30 MED ORDER — ATORVASTATIN CALCIUM 80 MG PO TABS: 80.0000 mg | ORAL_TABLET | Freq: Every day | ORAL | 3 refills | Status: AC

## 2023-09-30 MED ORDER — FENOFIBRATE 48 MG PO TABS: ORAL_TABLET | ORAL | 4 refills | Status: DC

## 2023-09-30 MED ORDER — NOVOLOG FLEXPEN 100 UNIT/ML ~~LOC~~ SOPN: 10.0000 [IU] | PEN_INJECTOR | Freq: Two times a day (BID) | SUBCUTANEOUS | 11 refills | Status: AC

## 2023-09-30 MED ORDER — TRESIBA FLEXTOUCH 200 UNIT/ML ~~LOC~~ SOPN: 90.0000 [IU] | PEN_INJECTOR | SUBCUTANEOUS | 3 refills | Status: AC

## 2023-09-30 NOTE — Patient Instructions (Addendum)
 Please stop by lab before you go If you have mychart- we will send your results within 3 business days of us  receiving them.  If you do not have mychart- we will call you about results within 5 business days of us  receiving them.  *please also note that you will see labs on mychart as soon as they post. I will later go in and write notes on them- will say notes from Dr. Katrinka   No changes today unless labs lead us  to make changes  Great job on weight loss and diabetes control- keep up the good work!   Recommended follow up: Return in about 4 months (around 01/30/2024) for physical or sooner if needed.Schedule b4 you leave. It may end up closer to 5 months or so

## 2023-09-30 NOTE — Progress Notes (Signed)
 Phone [phone] In person visit   Subjective:   [name] is a 53 y.o. year old very pleasant male patient who presents for/with See problem oriented charting Chief Complaint  Patient presents with   Type II diabetes mellitus with neurological manifestations   Past Medical History-  Patient Active Problem List   Diagnosis Date Noted   History of diabetes-related lower limb amputation (HCC) 09/25/2022    Priority: High   Diabetic retinopathy (HCC) 01/25/2018    Priority: High   Diabetic neuropathy (HCC) 11/17/2016    Priority: High   Type II diabetes mellitus with neurological manifestations (HCC) 03/07/2015    Priority: High   History of osteomyelitis 03/19/2023    Priority: Medium    Pituitary adenoma (HCC) 11/07/2021    Priority: Medium    Elevated LFTs     Priority: Medium    Type 2 diabetes mellitus with stage 3 chronic kidney disease, with long-term current use of insulin  (HCC) 03/20/2015    Priority: Medium    Hyperlipidemia associated with type 2 diabetes mellitus (HCC) 03/08/2015    Priority: Medium    Essential hypertension 03/08/2015    Priority: Medium    Morbid obesity (HCC) 03/08/2015    Priority: Medium    Charcot's joint of foot, right 11/18/2019    Priority: Low   B12 deficiency 11/17/2016    Priority: Low   Iron deficiency anemia 11/17/2016    Priority: Low   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     aspirin  EC 81 MG tablet Take 81 mg by mouth daily.     Cyanocobalamin  (VITAMIN B-12) 5000 MCG SUBL Place 5,000 mcg under the tongue daily.     Ferrous Sulfate  (IRON PO) Take 1 tablet by mouth daily.     ibuprofen   (ADVIL ) 800 MG tablet TAKE 1 TABLET BY MOUTH EVERY 6 HOURS AS NEEDED. 60 tablet 1   Insulin  Pen Needle (B-D UF III MINI PEN NEEDLES) 31G X 5 MM MISC USE TO INJECT INSULIN  DAILY. DX: E11.9 100 each 11   Multiple Vitamin (MULTIVITAMIN WITH MINERALS) TABS tablet Take 1 tablet by mouth daily.     Multiple Vitamins-Minerals (ZINC  PO) Take 1 tablet by mouth daily.     Omega-3 Fatty Acids (FISH OIL) 1360 MG CAPS Take 1,360 mg by mouth daily.      pyridoxine  (B-6) 100 MG tablet Take 100 mg by mouth daily.     ramipril  (ALTACE ) 5 MG capsule Take 1 capsule (5 mg total) by mouth daily. 90 capsule 3   tirzepatide  (MOUNJARO ) 5 MG/0.5ML Pen INJECT 5 MG SUBCUTANEOUSLY WEEKLY 6 mL 3   atorvastatin  (LIPITOR) 80 MG tablet Take 1 tablet (80 mg total) by mouth daily. 90 tablet 3   fenofibrate  (TRICOR ) 48 MG tablet TAKE 1 TABLET BY MOUTH EVERY DAY 90 tablet 4   insulin  aspart (NOVOLOG  FLEXPEN) 100 UNIT/ML FlexPen Inject 10-15 Units into the skin 2 (two) times daily. 15 mL 11   insulin  degludec (TRESIBA  FLEXTOUCH) 200 UNIT/ML FlexTouch Pen Inject 90 Units into the skin daily. 45 mL 3   No current facility-administered medications for this visit.  Objective:  BP 136/82   Pulse 77   Temp 98.4 F (36.9 C) (Temporal)   Ht 6' (1.829 m)   Wt 296 lb (134.3 kg)   SpO2 99%   BMI 40.14 kg/m  Gen: NAD, resting comfortably CV: RRR no murmurs rubs or gallops Lungs: CTAB no crackles, wheeze, rhonchi Ext: trace edema Skin: warm, dry    Assessment and Plan   #Diabetic foot wounds- close follow up with podiatry most recently 09/03/23 with posterior tibial tendon lengthening with achilles tendon lengthening 05/25/23. Has visit today to look at small area that has opened up- defer foot exam  #Diabetes-typically with A1c at least under 7.5 WITH microalbuminuria on ramipril  (lab error affected starting this) S: Medication: Tresiba  at night up to 90 units, NovoLog  10-15 units twice daily -Take Januvia  50 mg in the  morning but trial of Mounjaro  5 mg- down 8 lbs from last visit CBGs- no low blood sugars 73-116 over last month for most part- some #s as high as 145 in morning- was after a celebration day. Overall doing well-  Lab Results  Component Value Date   HGBA1C 6.3 05/29/2023   HGBA1C 6.5 01/30/2023   HGBA1C 8.8 (H) 09/25/2022  A/P: diabetes has been well controlled - suspect the same today looking at home sugars- update a1c with labs.  Microalbuminuria will trend- could consider higher dose ramipril   # Hyperlipidemia-Ideal LDL under 70-tolerate under 100 S:Medication: Atorvastatin  80 mg daily along with fenofibrate  48 mg - also prefers to take aspirin  for primary prevention  Lab Results  Component Value Date   CHOL 140 05/29/2023   HDL 43.00 05/29/2023   LDLCALC 73 05/29/2023   LDLDIRECT 142.0 09/25/2022   TRIG 118.0 05/29/2023   CHOLHDL 3 05/29/2023  A/P: lipids very close to ideal goal and working on weight loss- recheck next year- continue current medications for now    #Hypertension S: MEDICATION: Ramipril  5 mg.  Of note he has needed ibuprofen  after recent surgery -HCTZ 12.5 mg and Ramipril  5 mg-may need to restart Home readings #s: no recent checks  BP Readings from Last 3 Encounters:  09/30/23 136/82  06/25/23 125/83  05/29/23 (!) 142/86  A/P: blood pressure high acceptable - but will continue current medications - if microalbuminuria directs us  we may try higher dose ramipril    #B12 deficiency/anemia S Current treatment/medication(oral vs IM): Cyanocobalamin  5000 mcg daily. Also was takes ferrous sulfate  supplements twice daily.  Lab Results  Component Value Date   VITAMINB12 979 (H) 09/25/2022   Lab Results  Component Value Date   FERRITIN 539.5 (H) 09/25/2022   A/P: B12 and iron deficiency- update both today- may need to adjust regimen   #pituitary adenoma- noted years ago with fertility workup- prolactin level 09/2022 was normal. Could update MRI if change in  symptoms- but over 20 years and no issues - check prolactin today  #morbid obesity- BMI trending down on Mounjaro - may up dose if stagnates  Recommended follow up: Return in about 4 months (around 01/30/2024) for physical or sooner if needed.Schedule b4 you leave. Future Appointments  Date Time Provider Department Center  09/30/2023 10:45 AM Tobie Franky SQUIBB, DPM [facility]    Lab/Order associations:   ICD-10-CM   1. Need for influenza vaccination  Z23 Flu vaccine trivalent PF, 6mos and older(Flulaval,Afluria,Fluarix,Fluzone)    2. Type II diabetes mellitus with neurological manifestations (HCC)  E11.49 Urine Albumin/Creatinine with ratio (send out) [[id number]]    Comprehensive metabolic panel with GFR  CBC with Differential/Platelet    Hemoglobin A1c    3. Hyperlipidemia associated with type 2 diabetes mellitus (HCC)  E11.69    E78.5     4. Essential hypertension  I10     5. B12 deficiency  E53.8 Vitamin B12    6. Iron deficiency anemia, unspecified iron deficiency anemia type  D50.9 IBC + Ferritin    7. Pituitary adenoma (HCC)  D35.2 Prolactin      Meds ordered this encounter  Medications   insulin  aspart (NOVOLOG  FLEXPEN) 100 UNIT/ML FlexPen    Sig: Inject 10-15 Units into the skin 2 (two) times daily.    Dispense:  15 mL    Refill:  11   insulin  degludec (TRESIBA  FLEXTOUCH) 200 UNIT/ML FlexTouch Pen    Sig: Inject 90 Units into the skin daily.    Dispense:  45 mL    Refill:  3   fenofibrate  (TRICOR ) 48 MG tablet    Sig: TAKE 1 TABLET BY MOUTH EVERY DAY    Dispense:  90 tablet    Refill:  4   atorvastatin  (LIPITOR) 80 MG tablet    Sig: Take 1 tablet (80 mg total) by mouth daily.    Dispense:  90 tablet    Refill:  3    Return precautions advised.  Garnette Lukes, MD

## 2023-09-30 NOTE — Progress Notes (Signed)
 Subjective:  Patient ID: [name], male    DOB: 1970/09/28,  MRN: [id number]  Chief Complaint  Patient presents with   Diabetic Ulcer    Right foot ulcer follow up     53 y.o. male presents for wound care.  Patient presents with right lateral hindfoot ulceration.  This is doing okay manage about been doing Betadine  wet-to-dry dressing denies any other acute complaints  Review of Systems: Negative except as noted in the HPI. Denies N/V/F/Ch.  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   (06-17-2019 pt stated has been stable with no changes and followed by pcp)   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HCC)    11-06-2016  lower extremity angiography (dr eliza)  mil tibial disease w/ occlusion of the distal peroneal artery and dorsalis pedis artery but has widely patent posterior tibial and anterior tibial arterties   Type 2 diabetes mellitus treated with insulin  (HCC)    followed by pcp   (06-17-2019  pt stated checks blood sugar daily in am,  fasting sugar-- average 71)   Vitamin B 12 deficiency     Current Outpatient Medications:    ascorbic acid  (VITAMIN C ) 500 MG tablet, Take 500 mg by mouth daily., Disp: , Rfl:    aspirin  EC 81 MG tablet, Take 81 mg by mouth daily., Disp: , Rfl:    atorvastatin  (LIPITOR) 80 MG tablet, Take 1 tablet (80 mg total) by mouth daily., Disp: 90 tablet, Rfl: 3   Cyanocobalamin  (VITAMIN B-12) 5000 MCG  SUBL, Place 5,000 mcg under the tongue daily., Disp: , Rfl:    fenofibrate  (TRICOR ) 48 MG tablet, TAKE 1 TABLET BY MOUTH EVERY DAY, Disp: 90 tablet, Rfl: 4   Ferrous Sulfate  (IRON PO), Take 1 tablet by mouth daily., Disp: , Rfl:    ibuprofen  (ADVIL ) 800 MG tablet, TAKE 1 TABLET BY MOUTH EVERY 6 HOURS AS NEEDED., Disp: 60 tablet, Rfl: 1   insulin  aspart (NOVOLOG  FLEXPEN) 100 UNIT/ML FlexPen, Inject 10-15 Units into the skin 2 (two) times daily., Disp: 15 mL, Rfl: 11   insulin  degludec (TRESIBA  FLEXTOUCH) 200 UNIT/ML FlexTouch Pen, Inject 90 Units into the skin daily., Disp: 45 mL, Rfl: 3   Insulin  Pen Needle (B-D UF III MINI PEN NEEDLES) 31G X 5 MM MISC, USE TO INJECT INSULIN  DAILY. DX: E11.9, Disp: 100 each, Rfl: 11   Multiple Vitamin (MULTIVITAMIN WITH MINERALS) TABS tablet, Take 1 tablet by mouth daily., Disp: , Rfl:    Multiple Vitamins-Minerals (ZINC  PO), Take 1 tablet by mouth daily., Disp: , Rfl:    Omega-3 Fatty Acids (FISH OIL) 1360 MG CAPS, Take 1,360 mg by mouth daily. , Disp: , Rfl:    pyridoxine  (B-6) 100 MG tablet, Take 100 mg by mouth daily., Disp: , Rfl:    ramipril  (ALTACE ) 5 MG capsule, Take 1 capsule (5 mg  total) by mouth daily., Disp: 90 capsule, Rfl: 3   tirzepatide  (MOUNJARO ) 5 MG/0.5ML Pen, INJECT 5 MG SUBCUTANEOUSLY WEEKLY, Disp: 6 mL, Rfl: 3  Social History   Tobacco Use  Smoking Status Never  Smokeless Tobacco Never    No Known Allergies Objective:  There were no vitals filed for this visit. There is no height or weight on file to calculate BMI. Constitutional Well developed. Well nourished.  Vascular Dorsalis pedis pulses palpable bilaterally. Posterior tibial pulses palpable bilaterally. Capillary refill normal to all digits.  No cyanosis or clubbing noted. Pedal hair growth normal.  Neurologic Normal speech. Oriented to person, place, and time. Protective sensation absent  Dermatologic Wound Location: Right lateral hindfoot with fat layer exposed.  Does  not expose any bone.  No purulent drainage noted no malodor present Wound Base: Mixed Granular/Fibrotic Peri-wound: Calloused Exudate: Scant/small amount Serous exudate Wound Measurements: -See below  Orthopedic: No pain to palpation either foot.   Radiographs: None Assessment:   1. Diabetic ulcer of right midfoot associated with type 2 diabetes mellitus, with fat layer exposed (HCC)      Plan:  Patient was evaluated and treated and all questions answered.  Ulcer right lateral hindfoot ulcer -Debridement as below. -Dressed with Betadine  wet-to-dry, DSD. -Continue off-loading with surgical shoe.  Procedure: Excisional Debridement of Wound Tool: Sharp chisel blade/tissue nipper Rationale: Removal of non-viable soft tissue from the wound to promote healing.  Anesthesia: none Pre-Debridement Wound Measurements: 0.3 cm x 0 point 3 cm x 0.3 cm  Post-Debridement Wound Measurements: 0.5 cm x 0.5 cm x 0.3 cm Type of Debridement: Sharp Excisional Tissue Removed: Non-viable soft tissue Blood loss: Minimal (<50cc) Depth of Debridement: subcutaneous tissue. Technique: Sharp excisional debridement to bleeding, viable wound base.  Wound Progress: This is my initial evaluation of continue monitor the progression of the wound Dressing: Dry, sterile, compression dressing. Disposition: Patient tolerated procedure well. Patient to return in 1 week for follow-up.  No follow-ups on file.

## 2023-10-05 ENCOUNTER — Ambulatory Visit (INDEPENDENT_AMBULATORY_CARE_PROVIDER_SITE_OTHER)

## 2023-10-05 ENCOUNTER — Ambulatory Visit: Payer: Self-pay | Admitting: *Deleted

## 2023-10-05 ENCOUNTER — Encounter: Payer: Self-pay | Admitting: Family Medicine

## 2023-10-05 ENCOUNTER — Ambulatory Visit: Admitting: Family Medicine

## 2023-10-05 VITALS — BP 119/76 | HR 74 | Temp 98.8°F | Ht 72.0 in | Wt 296.2 lb

## 2023-10-05 DIAGNOSIS — S91301A Unspecified open wound, right foot, initial encounter: Secondary | ICD-10-CM

## 2023-10-05 DIAGNOSIS — Z794 Long term (current) use of insulin: Secondary | ICD-10-CM

## 2023-10-05 DIAGNOSIS — I10 Essential (primary) hypertension: Secondary | ICD-10-CM

## 2023-10-05 DIAGNOSIS — S91301D Unspecified open wound, right foot, subsequent encounter: Secondary | ICD-10-CM

## 2023-10-05 DIAGNOSIS — R5383 Other fatigue: Secondary | ICD-10-CM | POA: Diagnosis not present

## 2023-10-05 DIAGNOSIS — R6883 Chills (without fever): Secondary | ICD-10-CM

## 2023-10-05 DIAGNOSIS — E1149 Type 2 diabetes mellitus with other diabetic neurological complication: Secondary | ICD-10-CM

## 2023-10-05 LAB — POCT INFLUENZA A/B
Influenza A, POC: NEGATIVE
Influenza B, POC: NEGATIVE

## 2023-10-05 LAB — POC COVID19 BINAXNOW: SARS Coronavirus 2 Ag: NEGATIVE

## 2023-10-05 NOTE — Telephone Encounter (Signed)
 Appt today

## 2023-10-05 NOTE — Progress Notes (Signed)
 Phone [phone] In person visit   Subjective:   [name] is a 53 y.o. year old very pleasant male patient who presents for/with See problem oriented charting Chief Complaint  Patient presents with   Chills    Pt c/o chills, loss of appetite and fatigue, present since Friday.   Past Medical History-  Patient Active Problem List   Diagnosis Date Noted   History of diabetes-related lower limb amputation (HCC) 09/25/2022    Priority: High   Diabetic retinopathy (HCC) 01/25/2018    Priority: High   Diabetic neuropathy (HCC) 11/17/2016    Priority: High   Type II diabetes mellitus with neurological manifestations (HCC) 03/07/2015    Priority: High   History of osteomyelitis 03/19/2023    Priority: Medium    Pituitary adenoma (HCC) 11/07/2021    Priority: Medium    Elevated LFTs     Priority: Medium    Type 2 diabetes mellitus with stage 3 chronic kidney disease, with long-term current use of insulin  (HCC) 03/20/2015    Priority: Medium    Hyperlipidemia associated with type 2 diabetes mellitus (HCC) 03/08/2015    Priority: Medium    Essential hypertension 03/08/2015    Priority: Medium    Morbid obesity (HCC) 03/08/2015    Priority: Medium    Charcot's joint of foot, right 11/18/2019    Priority: Low   B12 deficiency 11/17/2016    Priority: Low   Iron deficiency anemia 11/17/2016    Priority: Low   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     aspirin  EC 81 MG tablet Take 81 mg by mouth daily.     atorvastatin  (LIPITOR) 80 MG tablet Take 1 tablet (80 mg total) by mouth daily. 90 tablet 3   Cyanocobalamin  (VITAMIN B-12) 5000 MCG SUBL  Place 5,000 mcg under the tongue daily.     fenofibrate  (TRICOR ) 48 MG tablet TAKE 1 TABLET BY MOUTH EVERY DAY 90 tablet 4   Ferrous Sulfate  (IRON PO) Take 1 tablet by mouth daily.     ibuprofen  (ADVIL ) 800 MG tablet TAKE 1 TABLET BY MOUTH EVERY 6 HOURS AS NEEDED. 60 tablet 1   insulin  aspart (NOVOLOG  FLEXPEN) 100 UNIT/ML FlexPen Inject 10-15 Units into the skin 2 (two) times daily. 15 mL 11   insulin  degludec (TRESIBA  FLEXTOUCH) 200 UNIT/ML FlexTouch Pen Inject 90 Units into the skin daily. 45 mL 3   Insulin  Pen Needle (B-D UF III MINI PEN NEEDLES) 31G X 5 MM MISC USE TO INJECT INSULIN  DAILY. DX: E11.9 100 each 11   Multiple Vitamin (MULTIVITAMIN WITH MINERALS) TABS tablet Take 1 tablet by mouth daily.     Multiple Vitamins-Minerals (ZINC  PO) Take 1 tablet by mouth daily.     Omega-3 Fatty Acids (FISH OIL) 1360 MG CAPS Take 1,360 mg by mouth daily.      pyridoxine  (B-6) 100 MG tablet Take 100 mg by mouth daily.     ramipril  (ALTACE ) 5 MG capsule Take 1 capsule (5 mg total) by mouth daily. 90 capsule 3   tirzepatide  (MOUNJARO ) 5 MG/0.5ML Pen INJECT 5 MG SUBCUTANEOUSLY WEEKLY 6 mL 3  No current facility-administered medications for this visit.     Objective:  BP 119/76 (BP Location: Left Arm, Patient Position: Sitting, Cuff Size: Large)   Pulse 74   Temp 98.8 F (37.1 C) (Temporal)   Ht 6' (1.829 m)   Wt 296 lb 3.2 oz (134.4 kg)   SpO2 99%   BMI 40.17 kg/m  Gen: NAD, resting comfortably Tympanic membrane normal, oropharynx normal, nasal turbinates normal, no obvious cervical lymphadenopathy CV: RRR no murmurs rubs or gallops Lungs: CTAB no crackles, wheeze, rhonchi Abdomen: soft/nontender/nondistended/normal bowel sounds. No rebound or guarding.  Ext: no edema Skin: warm, dry Right foot lateral portion chronic wound about half by half by half centimeter-reports debrided on the 10th with some surrounding hypertrophied tissue.  No active drainage and no purulence noted   Results for  orders placed or performed in visit on 10/05/23 (from the past 24 hours)  POC COVID-19     Status: Normal   Collection Time: 10/05/23  3:27 PM  Result Value Ref Range   SARS Coronavirus 2 Ag Negative Negative  POCT Influenza A/B     Status: Normal   Collection Time: 10/05/23  3:27 PM  Result Value Ref Range   Influenza A, POC Negative Negative   Influenza B, POC Negative Negative       Assessment and Plan    # severe fatigue/ loss of appetite S:symptom(s) hit Friday afternoon- when got home and felt weak walking inside. Slept most of day Saturday. Some chills but keeps house cold. Saturday ate fourth of peanut butter sandwich- sugars higher than usual but only like 150's. Low appetite persisted through today. Forcing himself to drink fluids. Diet v8 and water. Still able to go to work today  -no cough, congestion, diarrhea, nausea, vomiting. No shortness of breath  -wound on lateral right foot no worse- no discharge or odor  . Felt like he tweaked/turned ankle Friday but no lingering effects - used scooter short term but now back to normal and worked today- exhaustion improving some thankfully. Appetite slightly better yesterday- didn't really eat much breakfast  Chronic wound perhaps slightly more drainage Saturday but otherwise completely normal. No increased pain. No fever. But sensation limited in the foot. Plus saw podiatry on 09/30/23 and they thought he was doing well and has 1 week follow up.  A/P: Patient with severe fatigue and loss of appetite starting on Friday night lasting through the weekend and only gradually improving today - Flu and COVID testing negative - On exam no obvious cause of illness other than concerned about the foot - Viral illness very possible - With chronic wound on right foot will get updated x-rays as well as sed rate and CRP and CBC-if any concern for infection will involve podiatry plus he has close follow-up with them later this week - If has new or  worsening symptoms should return to care but I am slightly encouraged by improvement and discussed potential for viral illness that may simply take 7 to 14 days to improve  -blood cultures not available at our office- [facility] to [facility]- will hold on that unles worsening.  With improving symptoms I do think this is less likely    # Diabetes melitis-has been well-controlled with Tresiba  up to 90 units at night and NovoLog  25 units twice daily blood sugars slightly higher with recent illness.  He is also on Mounjaro  5 mg-continue current medication.  Diabetic neuropathy noted which certainly affects ability to detect  any pain in the foot Lab Results  Component Value Date   HGBA1C 6.2 09/30/2023   HGBA1C 6.3 05/29/2023   HGBA1C 6.5 01/30/2023   #hypertension S: medication: Ramipril  5 mg BP Readings from Last 3 Encounters:  10/05/23 119/76  09/30/23 136/82  06/25/23 125/83  A/P: Blood pressure well-controlled-continue current medication   Recommended follow up: Return for as needed for new, worsening, persistent symptoms. Future Appointments  Date Time Provider Department Center  10/21/2023  8:45 AM Tobie Franky SQUIBB, DPM [facility]  02/22/2024  3:00 PM Katrinka Garnette KIDD, MD [name]    Lab/Order associations:   ICD-10-CM   1. Chills  R68.83 POC COVID-19    POCT Influenza A/B    2. Fatigue, unspecified type  R53.83 Comprehensive metabolic panel with GFR    CBC with Differential/Platelet    TSH    DG Foot Complete Right    Sedimentation rate    C-reactive protein    3. Unspecified open wound, right foot, initial encounter  S91.301A DG Foot Complete Right    Sedimentation rate    C-reactive protein    4. Type II diabetes mellitus with neurological manifestations (HCC)  E11.49     5. Essential hypertension  I10       No orders of the defined types were placed in this encounter.   Return precautions advised.  Garnette Katrinka, MD

## 2023-10-05 NOTE — Telephone Encounter (Signed)
 Copied from CRM #[id number]. Topic: Clinical - Medical Advice >> Oct 05, 2023 10:59 AM Mesmerise C wrote: Reason for CRM: Patient's wife [name] stated last week patient got flu shot at appointment and since then has been experiencing loss of appetite, no taste, chills. cold sweats, tiredness and inquiring if it's due to the shot he received Reason for Disposition  [1] MILD weakness (e.g., does not interfere with ability to work, go to school, normal activities) AND [2] persists > 1 week  Answer Assessment - Initial Assessment Questions 1. DESCRIPTION: Describe how you are feeling.     [name], wife calling in.   He got his flu shot last week and is feeling bad now.  Sept 10 he got it.    Is he having a reaction to it?   Not usually.    He said coffee tastes funny.   Probiotic tastes differently.   He had V-8 juice yesterday and he got cold.  He had to wrap in a blanket.   Saturday night he was cold and he is having night sweats too because the sheets were wet.  Loss of appetite and very tired.   He is at work.  Poor appetite.  No sore throat or runny nose.      Why was he given the shot so early?   The news said to get it at the end of Nov.       2. SEVERITY: How bad is it?  Can you stand and walk?     He is tired and sick 3. ONSET: When did these symptoms begin? (e.g., hours, days, weeks, months)     He started feeling bad Friday.   He wasn't feeling well.   OVer the weekend he is still c/o being tired and poor appetite. 4. CAUSE: What do you think is causing the weakness or fatigue? (e.g., not drinking enough fluids, medical problem, trouble sleeping)     The flu shot or is he sick with something?   5. NEW MEDICINES:  Have you started on any new medicines recently? (e.g., opioid pain medicines, benzodiazepines, muscle relaxants, antidepressants, antihistamines, neuroleptics, beta blockers)     No  6. OTHER SYMPTOMS: Do you have any other symptoms? (e.g., chest pain, fever,  cough, SOB, vomiting, diarrhea, bleeding, other areas of pain)     See above 7. PREGNANCY: Is there any chance you are pregnant? When was your last menstrual period?     N/A  Protocols used: Weakness (Generalized) and Fatigue-A-AH FYI Only or Action Required?: FYI only for provider.  Patient was last seen in primary care on 09/30/2023 by Katrinka Garnette KIDD, MD.  Called Nurse Triage reporting Fatigue (Got flu shot last week). 9/10 in office    Symptoms began a week ago. Friday  Interventions attempted: Prescription medications: Got flu shot in office on 9/10.SABRA  Symptoms are: gradually worsening Chills, night sweats, very fatigued poor appetite and not feeling well.   Started Friday.  Triage Disposition: See PCP When Office is Open (Within 3 Days) Pt scheduled for today with Dr. Katrinka at 3:00.     Patient/caregiver understands and will follow disposition?: Yes

## 2023-10-05 NOTE — Patient Instructions (Addendum)
 Please stop by lab before you go If you have mychart- we will send your results within 3 business days of us  receiving them.  If you do not have mychart- we will call you about results within 5 business days of us  receiving them.  *please also note that you will see labs on mychart as soon as they post. I will later go in and write notes on them- will say notes from Dr. Katrinka Ar today as well  COVID and flu negative- unclear trigger today but evaluating foot as potential source and looking into some other causes as well- if labs point toward foot will reach out to podiatry   Recommended follow up: Return for as needed for new, worsening, persistent symptoms.

## 2023-10-06 ENCOUNTER — Ambulatory Visit: Payer: Self-pay | Admitting: Family Medicine

## 2023-10-06 ENCOUNTER — Other Ambulatory Visit: Payer: Self-pay

## 2023-10-06 LAB — COMPREHENSIVE METABOLIC PANEL WITH GFR
ALT: 227 U/L — ABNORMAL HIGH (ref 0–53)
AST: 124 U/L — ABNORMAL HIGH (ref 0–37)
Albumin: 3.9 g/dL (ref 3.5–5.2)
Alkaline Phosphatase: 113 U/L (ref 39–117)
BUN: 23 mg/dL (ref 6–23)
CO2: 28 meq/L (ref 19–32)
Calcium: 9.9 mg/dL (ref 8.4–10.5)
Chloride: 101 meq/L (ref 96–112)
Creatinine, Ser: 1.53 mg/dL — ABNORMAL HIGH (ref 0.40–1.50)
GFR: 51.59 mL/min — ABNORMAL LOW (ref >60.00)
Glucose, Bld: 129 mg/dL — ABNORMAL HIGH (ref 70–99)
Potassium: 4 meq/L (ref 3.5–5.1)
Sodium: 140 meq/L (ref 135–145)
Total Bilirubin: 1 mg/dL (ref 0.2–1.2)
Total Protein: 7.8 g/dL (ref 6.0–8.3)

## 2023-10-06 LAB — CBC WITH DIFFERENTIAL/PLATELET
Basophils Absolute: 0.1 K/uL (ref 0.0–0.1)
Basophils Relative: 0.9 % (ref 0.0–3.0)
Eosinophils Absolute: 0.1 K/uL (ref 0.0–0.7)
Eosinophils Relative: 1.7 % (ref 0.0–5.0)
HCT: 36.1 % — ABNORMAL LOW (ref 39.0–52.0)
Hemoglobin: 11.8 g/dL — ABNORMAL LOW (ref 13.0–17.0)
Lymphocytes Relative: 16.9 % (ref 12.0–46.0)
Lymphs Abs: 1.4 K/uL (ref 0.7–4.0)
MCHC: 32.8 g/dL (ref 30.0–36.0)
MCV: 79.3 fl (ref 78.0–100.0)
Monocytes Absolute: 1 K/uL (ref 0.1–1.0)
Monocytes Relative: 12.5 % — ABNORMAL HIGH (ref 3.0–12.0)
Neutro Abs: 5.6 K/uL (ref 1.4–7.7)
Neutrophils Relative %: 68 % (ref 43.0–77.0)
Platelets: 279 K/uL (ref 150.0–400.0)
RBC: 4.55 Mil/uL (ref 4.22–5.81)
RDW: 13.5 % (ref 11.5–15.5)
WBC: 8.3 K/uL (ref 4.0–10.5)

## 2023-10-06 LAB — SEDIMENTATION RATE: Sed Rate: 30 mm/h — ABNORMAL HIGH (ref 0–20)

## 2023-10-06 LAB — TSH: TSH: 2.03 u[IU]/mL (ref 0.35–5.50)

## 2023-10-06 LAB — C-REACTIVE PROTEIN: CRP: 12.7 mg/dL (ref 0.5–20.0)

## 2023-10-06 MED ORDER — BD PEN NEEDLE MINI U/F 31G X 5 MM MISC: 11 refills | Status: AC

## 2023-10-09 ENCOUNTER — Other Ambulatory Visit: Payer: Self-pay | Admitting: Family Medicine

## 2023-10-09 DIAGNOSIS — R7989 Other specified abnormal findings of blood chemistry: Secondary | ICD-10-CM

## 2023-10-09 DIAGNOSIS — D649 Anemia, unspecified: Secondary | ICD-10-CM

## 2023-10-16 ENCOUNTER — Ambulatory Visit (INDEPENDENT_AMBULATORY_CARE_PROVIDER_SITE_OTHER): Admitting: Podiatry

## 2023-10-16 DIAGNOSIS — E11621 Type 2 diabetes mellitus with foot ulcer: Secondary | ICD-10-CM

## 2023-10-16 DIAGNOSIS — L97412 Non-pressure chronic ulcer of right heel and midfoot with fat layer exposed: Secondary | ICD-10-CM | POA: Diagnosis not present

## 2023-10-16 MED ORDER — DOXYCYCLINE HYCLATE 100 MG PO TABS: 100.0000 mg | ORAL_TABLET | Freq: Two times a day (BID) | ORAL | 0 refills | Status: AC

## 2023-10-16 NOTE — Progress Notes (Signed)
 Subjective:  Patient ID: [name], male    DOB: [birth year],  MRN: [id number]  Chief Complaint  Patient presents with   Foot Ulcer    Rm 6 Pt has ulcer of the right midfoot. Ulcer is open with moderate drainage.    53 y.o. male presents for wound care.  Patient presents with right lateral hindfoot ulceration.  He states it started coming again and is draining a little bit is open.  Wanted to get it evaluated.  Review of Systems: Negative except as noted in the HPI. Denies N/V/F/Ch.  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   (06-17-2019 pt stated has been stable with no changes and followed by pcp)   Tibial artery occlusion    11-06-2016  lower extremity angiography (dr eliza)  mil tibial disease w/ occlusion of the distal peroneal artery and dorsalis pedis artery but has widely patent posterior tibial and anterior tibial arterties   Type 2 diabetes mellitus treated with insulin  (HCC)    followed by pcp   (06-17-2019  pt stated checks blood sugar daily in am,  fasting sugar-- average 71)   Vitamin B 12 deficiency     Current Outpatient Medications:    doxycycline  (VIBRA -TABS) 100 MG tablet, Take 1 tablet (100 mg total) by mouth 2 (two) times daily., Disp: 60 tablet, Rfl: 0   ascorbic acid  (VITAMIN C ) 500 MG tablet, Take 500 mg by mouth daily., Disp: , Rfl:    aspirin  EC 81 MG tablet, Take 81 mg by mouth daily.,  Disp: , Rfl:    atorvastatin  (LIPITOR) 80 MG tablet, Take 1 tablet (80 mg total) by mouth daily., Disp: 90 tablet, Rfl: 3   Cyanocobalamin  (VITAMIN B-12) 5000 MCG SUBL, Place 5,000 mcg under the tongue daily., Disp: , Rfl:    fenofibrate  (TRICOR ) 48 MG tablet, TAKE 1 TABLET BY MOUTH EVERY DAY, Disp: 90 tablet, Rfl: 4   Ferrous Sulfate  (IRON PO), Take 1 tablet by mouth daily., Disp: , Rfl:    ibuprofen  (ADVIL ) 800 MG tablet, TAKE 1 TABLET BY MOUTH EVERY 6 HOURS AS NEEDED., Disp: 60 tablet, Rfl: 1   insulin  aspart (NOVOLOG  FLEXPEN) 100 UNIT/ML FlexPen, Inject 10-15 Units into the skin 2 (two) times daily., Disp: 15 mL, Rfl: 11   insulin  degludec (TRESIBA  FLEXTOUCH) 200 UNIT/ML FlexTouch Pen, Inject 90 Units into the skin daily., Disp: 45 mL, Rfl: 3   Insulin  Pen Needle (B-D UF III MINI PEN NEEDLES) 31G X 5 MM MISC, USE TO INJECT INSULIN  THREE TIMES DAILY. DX: E11.9, Disp: 100 each, Rfl: 11   Multiple Vitamin (MULTIVITAMIN WITH MINERALS) TABS tablet, Take 1 tablet by mouth daily., Disp: , Rfl:    Multiple Vitamins-Minerals (ZINC  PO), Take 1 tablet by mouth daily., Disp: , Rfl:    Omega-3 Fatty Acids (FISH OIL) 1360 MG CAPS, Take 1,360  mg by mouth daily. , Disp: , Rfl:    pyridoxine  (B-6) 100 MG tablet, Take 100 mg by mouth daily., Disp: , Rfl:    ramipril  (ALTACE ) 5 MG capsule, Take 1 capsule (5 mg total) by mouth daily., Disp: 90 capsule, Rfl: 3   tirzepatide  (MOUNJARO ) 5 MG/0.5ML Pen, INJECT 5 MG SUBCUTANEOUSLY WEEKLY, Disp: 6 mL, Rfl: 3  Social History   Tobacco Use  Smoking Status Never  Smokeless Tobacco Never    No Known Allergies Objective:  There were no vitals filed for this visit. There is no height or weight on file to calculate BMI. Constitutional Well developed. Well nourished.  Vascular Dorsalis pedis pulses palpable bilaterally. Posterior tibial pulses palpable bilaterally. Capillary refill normal to all digits.  No cyanosis or clubbing noted. Pedal hair growth normal.   Neurologic Normal speech. Oriented to person, place, and time. Protective sensation absent  Dermatologic Wound Location: Right lateral hindfoot with fat layer exposed.  Does not expose any bone.  No purulent drainage noted no malodor present Wound Base: Mixed Granular/Fibrotic Peri-wound: Calloused Exudate: Scant/small amount Serous exudate Wound Measurements: -See below  Orthopedic: No pain to palpation either foot.   Radiographs: None Assessment:   1. Diabetic ulcer of right midfoot associated with type 2 diabetes mellitus, with fat layer exposed (HCC)      Plan:  Patient was evaluated and treated and all questions answered.  Ulcer right lateral hindfoot ulcer -Debridement as below. -Dressed with Betadine  wet-to-dry, DSD. -Continue off-loading with surgical shoe. - MRI was ordered to assess for osteomyelitis of the midfoot.  If there is osteomyelitis present patient is a high risk of undergoing his metatarsal amputation or Chopart amputation I briefly discussed with patient he states understand will get the MRI  Procedure: Excisional Debridement of Wound Tool: Sharp chisel blade/tissue nipper Rationale: Removal of non-viable soft tissue from the wound to promote healing.  Anesthesia: none Pre-Debridement Wound Measurements: 0.3 cm x 0 point 3 cm x 0.3 cm  Post-Debridement Wound Measurements: 0.5 cm x 0.5 cm x 0.3 cm Type of Debridement: Sharp Excisional Tissue Removed: Non-viable soft tissue Blood loss: Minimal (<50cc) Depth of Debridement: subcutaneous tissue. Technique: Sharp excisional debridement to bleeding, viable wound base.  Wound Progress: The wound has regressed clinically and is about stagnant Dressing: Dry, sterile, compression dressing. Disposition: Patient tolerated procedure well. Patient to return in 1 week for follow-up.  No follow-ups on file.

## 2023-10-19 ENCOUNTER — Ambulatory Visit: Payer: Self-pay | Admitting: Family Medicine

## 2023-10-19 ENCOUNTER — Other Ambulatory Visit (INDEPENDENT_AMBULATORY_CARE_PROVIDER_SITE_OTHER)

## 2023-10-19 DIAGNOSIS — D649 Anemia, unspecified: Secondary | ICD-10-CM

## 2023-10-19 DIAGNOSIS — R7989 Other specified abnormal findings of blood chemistry: Secondary | ICD-10-CM | POA: Diagnosis not present

## 2023-10-19 LAB — CBC WITH DIFFERENTIAL/PLATELET
Basophils Absolute: 0 K/uL (ref 0.0–0.1)
Basophils Relative: 0.3 % (ref 0.0–3.0)
Eosinophils Absolute: 0.1 K/uL (ref 0.0–0.7)
Eosinophils Relative: 1.5 % (ref 0.0–5.0)
HCT: 37.3 % — ABNORMAL LOW (ref 39.0–52.0)
Hemoglobin: 12.2 g/dL — ABNORMAL LOW (ref 13.0–17.0)
Lymphocytes Relative: 28.4 % (ref 12.0–46.0)
Lymphs Abs: 2.4 K/uL (ref 0.7–4.0)
MCHC: 32.7 g/dL (ref 30.0–36.0)
MCV: 79.9 fl (ref 78.0–100.0)
Monocytes Absolute: 0.3 K/uL (ref 0.1–1.0)
Monocytes Relative: 4 % (ref 3.0–12.0)
Neutro Abs: 5.5 K/uL (ref 1.4–7.7)
Neutrophils Relative %: 65.8 % (ref 43.0–77.0)
Platelets: 422 K/uL — ABNORMAL HIGH (ref 150.0–400.0)
RBC: 4.66 Mil/uL (ref 4.22–5.81)
RDW: 13.7 % (ref 11.5–15.5)
WBC: 8.3 K/uL (ref 4.0–10.5)

## 2023-10-19 LAB — COMPREHENSIVE METABOLIC PANEL WITH GFR
ALT: 34 U/L (ref 0–53)
AST: 23 U/L (ref 0–37)
Albumin: 4.1 g/dL (ref 3.5–5.2)
Alkaline Phosphatase: 84 U/L (ref 39–117)
BUN: 17 mg/dL (ref 6–23)
CO2: 28 meq/L (ref 19–32)
Calcium: 10.1 mg/dL (ref 8.4–10.5)
Chloride: 103 meq/L (ref 96–112)
Creatinine, Ser: 1.24 mg/dL (ref 0.40–1.50)
GFR: 66.37 mL/min (ref >60.00)
Glucose, Bld: 92 mg/dL (ref 70–99)
Potassium: 4.3 meq/L (ref 3.5–5.1)
Sodium: 139 meq/L (ref 135–145)
Total Bilirubin: 0.9 mg/dL (ref 0.2–1.2)
Total Protein: 7.7 g/dL (ref 6.0–8.3)

## 2023-10-21 ENCOUNTER — Ambulatory Visit: Admitting: Podiatry

## 2023-11-06 ENCOUNTER — Telehealth: Payer: Self-pay

## 2023-11-06 NOTE — Telephone Encounter (Signed)
 Copied from CRM [id number]. Topic: Clinical - Prescription Issue >> Nov 06, 2023  7:56 AM Macario HERO wrote: Reason for CRM: Patient spouse called and said that the insurance will not cover insulin  degludec (TRESIBA  FLEXTOUCH) 200 UNIT/ML FlexTouch Pen [[id number]] and it must be the name brand Tresiba  instead. -- Also requesting if there are any discount cards to reduce the cost of tirzepatide  (MOUNJARO ) 5 MG/0.5ML Pen [[id number]]. -- Requesting a call back: [phone]

## 2023-11-06 NOTE — Telephone Encounter (Signed)
 She is typing Tresiba  in clinic dispense as written.  Pick the branded version.  I can show you how if needed for future reference  As far as Mounjaro -have they tried type again Mounjaro  savings-should take them directly to the website and they have a card in there that they can submit for  [email or web address]

## 2023-11-06 NOTE — Telephone Encounter (Signed)
 Do you have a solution for the insulin .

## 2023-11-09 ENCOUNTER — Other Ambulatory Visit (HOSPITAL_COMMUNITY): Payer: Self-pay

## 2023-11-10 ENCOUNTER — Other Ambulatory Visit (HOSPITAL_COMMUNITY): Payer: Self-pay

## 2023-11-30 ENCOUNTER — Other Ambulatory Visit: Payer: Self-pay | Admitting: Family Medicine

## 2023-12-08 ENCOUNTER — Ambulatory Visit (INDEPENDENT_AMBULATORY_CARE_PROVIDER_SITE_OTHER): Payer: Self-pay | Admitting: Podiatry

## 2023-12-08 DIAGNOSIS — E11621 Type 2 diabetes mellitus with foot ulcer: Secondary | ICD-10-CM

## 2023-12-08 DIAGNOSIS — L97412 Non-pressure chronic ulcer of right heel and midfoot with fat layer exposed: Secondary | ICD-10-CM

## 2023-12-08 MED ORDER — DAKINS (1/2 STRENGTH) 0.25 % EX SOLN: 1.0000 | Freq: Two times a day (BID) | CUTANEOUS | 0 refills | Status: DC

## 2023-12-08 NOTE — Progress Notes (Signed)
 Subjective:  Patient ID: [name], male    DOB: [birth year],  MRN: [id number]  Chief Complaint  Patient presents with   Foot Ulcer    Right foot wound check     53 y.o. male presents for wound care.  Patient presents with right lateral hindfoot ulceration.  He states it started coming again and is draining a little bit is open.  Wanted to get it evaluated.  Review of Systems: Negative except as noted in the HPI. Denies N/V/F/Ch.  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   (06-17-2019 pt stated has been stable with no changes and followed by pcp)   Tibial artery occlusion    11-06-2016  lower extremity angiography (dr eliza)  mil tibial disease w/ occlusion of the distal peroneal artery and dorsalis pedis artery but has widely patent posterior tibial and anterior tibial arterties   Type 2 diabetes mellitus treated with insulin  (HCC)    followed by pcp   (06-17-2019  pt stated checks blood sugar daily in am,  fasting sugar-- average 71)   Vitamin B 12 deficiency     Current Outpatient Medications:    sodium hypochlorite (DAKIN'S 1/2 STRENGTH) external solution, Irrigate with 1 Application as directed in the morning and at bedtime., Disp: 473 mL, Rfl: 0   ascorbic acid  (VITAMIN C ) 500 MG tablet, Take 500 mg by mouth daily., Disp: , Rfl:    aspirin  EC 81 MG tablet, Take 81 mg by mouth daily., Disp: , Rfl:     atorvastatin  (LIPITOR) 80 MG tablet, Take 1 tablet (80 mg total) by mouth daily., Disp: 90 tablet, Rfl: 3   Cyanocobalamin  (VITAMIN B-12) 5000 MCG SUBL, Place 5,000 mcg under the tongue daily., Disp: , Rfl:    doxycycline  (VIBRA -TABS) 100 MG tablet, Take 1 tablet (100 mg total) by mouth 2 (two) times daily., Disp: 60 tablet, Rfl: 0   fenofibrate  (TRICOR ) 48 MG tablet, TAKE 1 TABLET BY MOUTH EVERY DAY, Disp: 90 tablet, Rfl: 4   Ferrous Sulfate  (IRON PO), Take 1 tablet by mouth daily., Disp: , Rfl:    ibuprofen  (ADVIL ) 800 MG tablet, TAKE 1 TABLET BY MOUTH EVERY 6 HOURS AS NEEDED., Disp: 60 tablet, Rfl: 1   insulin  aspart (NOVOLOG  FLEXPEN) 100 UNIT/ML FlexPen, Inject 10-15 Units into the skin 2 (two) times daily., Disp: 15 mL, Rfl: 11   insulin  degludec (TRESIBA  FLEXTOUCH) 200 UNIT/ML FlexTouch Pen, Inject 90 Units into the skin daily., Disp: 45 mL, Rfl: 3   Insulin  Pen Needle (B-D UF III MINI PEN NEEDLES) 31G X 5 MM MISC, USE TO INJECT INSULIN  THREE TIMES DAILY. DX: E11.9, Disp: 100 each, Rfl: 11   Multiple Vitamin (MULTIVITAMIN WITH MINERALS) TABS tablet, Take 1 tablet by mouth daily., Disp: , Rfl:    Multiple Vitamins-Minerals (ZINC  PO), Take 1 tablet by mouth daily.,  Disp: , Rfl:    Omega-3 Fatty Acids (FISH OIL) 1360 MG CAPS, Take 1,360 mg by mouth daily. , Disp: , Rfl:    pyridoxine  (B-6) 100 MG tablet, Take 100 mg by mouth daily., Disp: , Rfl:    ramipril  (ALTACE ) 5 MG capsule, Take 1 capsule (5 mg total) by mouth daily., Disp: 90 capsule, Rfl: 3   tirzepatide  (MOUNJARO ) 5 MG/0.5ML Pen, INJECT 5 MG SUBCUTANEOUSLY WEEKLY, Disp: 6 mL, Rfl: 11  Social History   Tobacco Use  Smoking Status Never  Smokeless Tobacco Never    No Known Allergies Objective:  There were no vitals filed for this visit. There is no height or weight on file to calculate BMI. Constitutional Well developed. Well nourished.  Vascular Dorsalis pedis pulses palpable bilaterally. Posterior tibial pulses palpable  bilaterally. Capillary refill normal to all digits.  No cyanosis or clubbing noted. Pedal hair growth normal.  Neurologic Normal speech. Oriented to person, place, and time. Protective sensation absent  Dermatologic Wound Location: Right lateral hindfoot with fat layer exposed.  Does not expose any bone.  No purulent drainage noted no malodor present Wound Base: Mixed Granular/Fibrotic Peri-wound: Calloused Exudate: Scant/small amount Serous exudate Wound Measurements: -See below  Orthopedic: No pain to palpation either foot.   Radiographs: None Assessment:   No diagnosis found.    Plan:  Patient was evaluated and treated and all questions answered.  Ulcer right lateral hindfoot ulcer -Debridement as below. -Dressed with Dakin solution -Continue off-loading with surgical shoe. - MRI was ordered to assess for osteomyelitis of the midfoot.  If there is osteomyelitis present patient is a high risk of undergoing his metatarsal amputation or Chopart amputation I briefly discussed with patient he states understand will get the MRI -  Procedure: Excisional Debridement of Wound Tool: Sharp chisel blade/tissue nipper Rationale: Removal of non-viable soft tissue from the wound to promote healing.  Anesthesia: none Pre-Debridement Wound Measurements: 1.5 cm x 1.5 cm x 0.5 cm Post-Debridement Wound Measurements: 1.8 cm x 1.6 cm x 0.6 cm Type of Debridement: Sharp Excisional Tissue Removed: Non-viable soft tissue Blood loss: Minimal (<50cc) Depth of Debridement: subcutaneous tissue. Technique: Sharp excisional debridement to bleeding, viable wound base.  Wound Progress: The wound has regressed clinically and there is slight malodor to it Dressing: Dry, sterile, compression dressing. Disposition: Patient tolerated procedure well. Patient to return in 1 week for follow-up.  No follow-ups on file.

## 2023-12-23 ENCOUNTER — Encounter: Payer: Self-pay | Admitting: Family Medicine

## 2023-12-23 MED ORDER — TIRZEPATIDE 7.5 MG/0.5ML ~~LOC~~ SOAJ: 7.5000 mg | SUBCUTANEOUS | 5 refills | Status: AC

## 2023-12-25 ENCOUNTER — Ambulatory Visit: Payer: Self-pay | Admitting: Podiatry

## 2023-12-25 DIAGNOSIS — E11621 Type 2 diabetes mellitus with foot ulcer: Secondary | ICD-10-CM

## 2023-12-25 DIAGNOSIS — L97412 Non-pressure chronic ulcer of right heel and midfoot with fat layer exposed: Secondary | ICD-10-CM

## 2023-12-25 MED ORDER — DAKINS (1/2 STRENGTH) 0.25 % EX SOLN: 1.0000 | Freq: Two times a day (BID) | CUTANEOUS | 0 refills | Status: AC

## 2023-12-25 NOTE — Progress Notes (Signed)
 Subjective:  Patient ID: [name], male    DOB: May 24, 1970,  MRN: [id number]  Chief Complaint  Patient presents with   Foot Ulcer    Foot ulcer right foot follow up he stated that he has good days and bad ones     53 y.o. male presents for wound care.  Patient presents with right lateral hindfoot ulceration.  He states the [place] solution is helping.  The wound is still about the same denies any other acute complaints.  Review of Systems: Negative except as noted in the HPI. Denies N/V/F/Ch.  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   (06-17-2019 pt stated has been stable with no changes and followed by pcp)   Tibial artery occlusion    11-06-2016  lower extremity angiography (dr eliza)  mil tibial disease w/ occlusion of the distal peroneal artery and dorsalis pedis artery but has widely patent posterior tibial and anterior tibial arterties   Type 2 diabetes mellitus treated with insulin  (HCC)    followed by pcp   (06-17-2019  pt stated checks blood sugar daily in am,  fasting sugar-- average 71)   Vitamin B 12 deficiency     Current Outpatient Medications:    sodium hypochlorite (DAKIN'S 1/2 STRENGTH) external solution, Irrigate with 1 Application as directed in the morning and at bedtime., Disp: 473 mL, Rfl: 0   ascorbic acid  (VITAMIN C ) 500 MG tablet, Take 500 mg by mouth daily., Disp: , Rfl:    aspirin  EC 81  MG tablet, Take 81 mg by mouth daily., Disp: , Rfl:    atorvastatin  (LIPITOR) 80 MG tablet, Take 1 tablet (80 mg total) by mouth daily., Disp: 90 tablet, Rfl: 3   Cyanocobalamin  (VITAMIN B-12) 5000 MCG SUBL, Place 5,000 mcg under the tongue daily., Disp: , Rfl:    doxycycline  (VIBRA -TABS) 100 MG tablet, Take 1 tablet (100 mg total) by mouth 2 (two) times daily., Disp: 60 tablet, Rfl: 0   fenofibrate  (TRICOR ) 48 MG tablet, TAKE 1 TABLET BY MOUTH EVERY DAY, Disp: 90 tablet, Rfl: 4   Ferrous Sulfate  (IRON PO), Take 1 tablet by mouth daily., Disp: , Rfl:    ibuprofen  (ADVIL ) 800 MG tablet, TAKE 1 TABLET BY MOUTH EVERY 6 HOURS AS NEEDED., Disp: 60 tablet, Rfl: 1   insulin  aspart (NOVOLOG  FLEXPEN) 100 UNIT/ML FlexPen, Inject 10-15 Units into the skin 2 (two) times daily., Disp: 15 mL, Rfl: 11   insulin  degludec (TRESIBA  FLEXTOUCH) 200 UNIT/ML FlexTouch Pen, Inject 90 Units into the skin daily., Disp: 45 mL, Rfl: 3   Insulin  Pen Needle (B-D UF III MINI PEN NEEDLES) 31G X 5 MM MISC, USE TO INJECT INSULIN  THREE TIMES DAILY. DX: E11.9, Disp: 100 each, Rfl: 11   Multiple Vitamin (MULTIVITAMIN WITH MINERALS) TABS tablet, Take 1 tablet by mouth daily., Disp: , Rfl:  Multiple Vitamins-Minerals (ZINC  PO), Take 1 tablet by mouth daily., Disp: , Rfl:    Omega-3 Fatty Acids (FISH OIL) 1360 MG CAPS, Take 1,360 mg by mouth daily. , Disp: , Rfl:    pyridoxine  (B-6) 100 MG tablet, Take 100 mg by mouth daily., Disp: , Rfl:    ramipril  (ALTACE ) 5 MG capsule, Take 1 capsule (5 mg total) by mouth daily., Disp: 90 capsule, Rfl: 3   sodium hypochlorite (DAKIN'S 1/2 STRENGTH) external solution, Irrigate with 1 Application as directed in the morning and at bedtime., Disp: 473 mL, Rfl: 0   tirzepatide  (MOUNJARO ) 7.5 MG/0.5ML Pen, Inject 7.5 mg into the skin once a week., Disp: 2 mL, Rfl: 5  Social History   Tobacco Use  Smoking Status Never  Smokeless Tobacco Never    No Known Allergies Objective:  There were no  vitals filed for this visit. There is no height or weight on file to calculate BMI. Constitutional Well developed. Well nourished.  Vascular Dorsalis pedis pulses palpable bilaterally. Posterior tibial pulses palpable bilaterally. Capillary refill normal to all digits.  No cyanosis or clubbing noted. Pedal hair growth normal.  Neurologic Normal speech. Oriented to person, place, and time. Protective sensation absent  Dermatologic Wound Location: Right lateral hindfoot with fat layer exposed.  Does not expose any bone.  No purulent drainage noted no malodor present Wound Base: Mixed Granular/Fibrotic Peri-wound: Calloused Exudate: Scant/small amount Serous exudate Wound Measurements: -See below  Orthopedic: No pain to palpation either foot.   Radiographs: None Assessment:   1. Diabetic ulcer of right midfoot associated with type 2 diabetes mellitus, with fat layer exposed (HCC)       Plan:  Patient was evaluated and treated and all questions answered.  Ulcer right lateral hindfoot ulcer -Debridement as below. -Dressed with Dakin solution -Continue off-loading with surgical shoe. - MRI was ordered to assess for osteomyelitis of the midfoot.  If there is osteomyelitis present patient is a high risk of undergoing his metatarsal amputation or Ch[place] amputation I briefly discussed with patient he states understand will get the MRI  Procedure: Excisional Debridement of Wound Tool: Sharp chisel blade/tissue nipper Rationale: Removal of non-viable soft tissue from the wound to promote healing.  Anesthesia: none Pre-Debridement Wound Measurements: 1.7 cm x 1.5 cm x 0.5 cm Post-Debridement Wound Measurements: 2.0 cm x 1.6 cm x 0.6 cm Type of Debridement: Sharp Excisional Tissue Removed: Non-viable soft tissue Blood loss: Minimal (<50cc) Depth of Debridement: subcutaneous tissue. Technique: Sharp excisional debridement to bleeding, viable wound base.  Wound Progress: The wound is  stagnant.   Disposition: Patient tolerated procedure well. Patient to return in 1 week for follow-up.  No follow-ups on file.

## 2023-12-28 ENCOUNTER — Other Ambulatory Visit (HOSPITAL_COMMUNITY): Payer: Self-pay

## 2023-12-28 ENCOUNTER — Telehealth: Payer: Self-pay | Admitting: Pharmacy Technician

## 2023-12-28 NOTE — Telephone Encounter (Signed)
 Pharmacy Patient Advocate Encounter   Received notification from CoverMyMeds that prior authorization for Mounjaro  7.5MG /0.5ML auto-injectors  is required/requested.   Insurance verification completed.   The patient is insured through [employer].   Per test claim: PA required; PA submitted to above mentioned insurance via Prompt PA Key/confirmation #/EOC [id number] Status is pending

## 2023-12-29 ENCOUNTER — Telehealth: Payer: Self-pay | Admitting: Family Medicine

## 2023-12-29 ENCOUNTER — Other Ambulatory Visit (HOSPITAL_COMMUNITY): Payer: Self-pay

## 2023-12-29 NOTE — Telephone Encounter (Signed)
 Please see patient message and advise on need for prior auth.   Thanks!   Copied from CRM #[id number]. Topic: Clinical - Prescription Issue >> Dec 29, 2023 12:55 PM Anairis L wrote: Reason for CRM: Patient wife Earnie is calling in because pharmacy has informed her tirzepatide  (MOUNJARO ) 7.5 MG/0.5ML Pen requires prior authorization.

## 2023-12-30 NOTE — Telephone Encounter (Signed)
 PA request has been Submitted. New Encounter has been or will be created for follow up. For additional info see Pharmacy Prior Auth telephone encounter from 12/28/23.

## 2023-12-31 NOTE — Telephone Encounter (Signed)
 Pharmacy Patient Advocate Encounter  Received notification from [employer] that Prior Authorization for  Mounjaro  7.5MG /0.5ML auto-injectors  has been APPROVED from 129/25 to 12/26/24   PA #/Case ID/Reference #: [id number]

## 2024-01-09 ENCOUNTER — Inpatient Hospital Stay: Admission: RE | Admit: 2024-01-09 | Discharge: 2024-01-09 | Attending: Podiatry

## 2024-01-09 DIAGNOSIS — E11621 Type 2 diabetes mellitus with foot ulcer: Secondary | ICD-10-CM

## 2024-01-16 ENCOUNTER — Other Ambulatory Visit: Payer: Self-pay | Admitting: Family Medicine

## 2024-01-20 ENCOUNTER — Ambulatory Visit: Payer: Self-pay | Admitting: Podiatry

## 2024-01-20 DIAGNOSIS — L97412 Non-pressure chronic ulcer of right heel and midfoot with fat layer exposed: Secondary | ICD-10-CM

## 2024-01-20 DIAGNOSIS — E11621 Type 2 diabetes mellitus with foot ulcer: Secondary | ICD-10-CM | POA: Diagnosis not present

## 2024-01-20 MED ORDER — DOXYCYCLINE HYCLATE 100 MG PO TABS: 100.0000 mg | ORAL_TABLET | Freq: Two times a day (BID) | ORAL | 0 refills | Status: AC

## 2024-01-20 MED ORDER — DAKINS (1/2 STRENGTH) 0.25 % EX SOLN: 1.0000 | Freq: Two times a day (BID) | CUTANEOUS | 0 refills | Status: DC

## 2024-01-20 NOTE — Addendum Note (Signed)
 Addended by: Kolby Schara on: 01/20/2024 12:05 PM   Modules accepted: Orders

## 2024-01-20 NOTE — Progress Notes (Signed)
 "  Subjective:  Patient ID: [name], male    DOB: 05/29/1970,  MRN: [id number]  Chief Complaint  Patient presents with   Diabetic Ulcer    Pt is here for a follow up on right foot diabetic ulcer     53 y.o. male presents for wound care.  Patient presents with right lateral hindfoot ulceration.  He states the [place] solution is helping.  The wound is still about the same denies any other acute complaints.  Review of Systems: Negative except as noted in the HPI. Denies N/V/F/Ch.  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   (06-17-2019 pt stated has been stable with no changes and followed by pcp)   Tibial artery occlusion    11-06-2016  lower extremity angiography (dr eliza)  mil tibial disease w/ occlusion of the distal peroneal artery and dorsalis pedis artery but has widely patent posterior tibial and anterior tibial arterties   Type 2 diabetes mellitus treated with insulin  (HCC)    followed by pcp   (06-17-2019  pt stated checks blood sugar daily in am,  fasting sugar-- average 71)   Vitamin B 12 deficiency     Current Outpatient Medications:    ascorbic acid  (VITAMIN C ) 500 MG tablet, Take 500 mg by mouth daily., Disp: , Rfl:    aspirin  EC 81 MG tablet, Take 81 mg by mouth daily., Disp: , Rfl:    atorvastatin  (LIPITOR) 80 MG tablet, Take 1 tablet (80 mg total) by mouth daily., Disp: 90 tablet, Rfl: 3    Cyanocobalamin  (VITAMIN B-12) 5000 MCG SUBL, Place 5,000 mcg under the tongue daily., Disp: , Rfl:    doxycycline  (VIBRA -TABS) 100 MG tablet, Take 1 tablet (100 mg total) by mouth 2 (two) times daily., Disp: 60 tablet, Rfl: 0   fenofibrate  (TRICOR ) 48 MG tablet, TAKE 1 TABLET BY MOUTH EVERY DAY, Disp: 90 tablet, Rfl: 4   Ferrous Sulfate  (IRON PO), Take 1 tablet by mouth daily., Disp: , Rfl:    hydrochlorothiazide  (MICROZIDE ) 12.5 MG capsule, TAKE 1 CAPSULE BY MOUTH EVERY DAY IN THE MORNING, Disp: 90 capsule, Rfl: 4   ibuprofen  (ADVIL ) 800 MG tablet, TAKE 1 TABLET BY MOUTH EVERY 6 HOURS AS NEEDED., Disp: 60 tablet, Rfl: 1   insulin  aspart (NOVOLOG  FLEXPEN) 100 UNIT/ML FlexPen, Inject 10-15 Units into the skin 2 (two) times daily., Disp: 15 mL, Rfl: 11   insulin  degludec (TRESIBA  FLEXTOUCH) 200 UNIT/ML FlexTouch Pen, Inject 90 Units into the skin daily., Disp: 45 mL, Rfl: 3   Insulin  Pen Needle (B-D UF III MINI PEN NEEDLES) 31G X 5 MM MISC, USE TO INJECT INSULIN  THREE TIMES DAILY. DX: E11.9, Disp: 100 each, Rfl: 11   Multiple Vitamin (MULTIVITAMIN WITH MINERALS) TABS tablet, Take 1 tablet by mouth daily., Disp: , Rfl:    Multiple Vitamins-Minerals (ZINC  PO),  Take 1 tablet by mouth daily., Disp: , Rfl:    Omega-3 Fatty Acids (FISH OIL) 1360 MG CAPS, Take 1,360 mg by mouth daily. , Disp: , Rfl:    pyridoxine  (B-6) 100 MG tablet, Take 100 mg by mouth daily., Disp: , Rfl:    ramipril  (ALTACE ) 5 MG capsule, Take 1 capsule (5 mg total) by mouth daily., Disp: 90 capsule, Rfl: 3   sodium hypochlorite (DAKIN'S 1/2 STRENGTH) external solution, Irrigate with 1 Application as directed in the morning and at bedtime., Disp: 473 mL, Rfl: 0   tirzepatide  (MOUNJARO ) 7.5 MG/0.5ML Pen, Inject 7.5 mg into the skin once a week., Disp: 2 mL, Rfl: 5  Social History   Tobacco Use  Smoking Status Never  Smokeless Tobacco Never    No Known Allergies Objective:  There were no vitals filed for this visit. There is no  height or weight on file to calculate BMI. Constitutional Well developed. Well nourished.  Vascular Dorsalis pedis pulses palpable bilaterally. Posterior tibial pulses palpable bilaterally. Capillary refill normal to all digits.  No cyanosis or clubbing noted. Pedal hair growth normal.  Neurologic Normal speech. Oriented to person, place, and time. Protective sensation absent  Dermatologic Wound Location: Right lateral hindfoot with fat layer exposed.  Does not expose any bone.  No purulent drainage noted no malodor present Wound Base: Mixed Granular/Fibrotic Peri-wound: Calloused Exudate: Scant/small amount Serous exudate Wound Measurements: -See below  Orthopedic: No pain to palpation either foot.   Radiographs: None Assessment:   1. Diabetic ulcer of right midfoot associated with type 2 diabetes mellitus, with fat layer exposed (HCC)       Plan:  Patient was evaluated and treated and all questions answered.  Ulcer right lateral hindfoot ulcer -Debridement as below. -Dressed with Dakin solution and Betadine  solution together once a day -Continue off-loading with surgical shoe. - Awaiting official read by radiologist on the MRI.  At the moment patient does not have any clinical signs of the wound is very stable  Procedure: Excisional Debridement of Wound Tool: Sharp chisel blade/tissue nipper Rationale: Removal of non-viable soft tissue from the wound to promote healing.  Anesthesia: none Pre-Debridement Wound Measurements: 1.5 cm x 1.5 cm x 0.5 cm Post-Debridement Wound Measurements: 1.8 cm x 1.5 cm x 0.6 cm Type of Debridement: Sharp Excisional Tissue Removed: Non-viable soft tissue Blood loss: Minimal (<50cc) Depth of Debridement: subcutaneous tissue. Technique: Sharp excisional debridement to bleeding, viable wound base.  Wound Progress: The wound is stagnant.   Disposition: Patient tolerated procedure well. Patient to return in 1 week for follow-up.  No  follow-ups on file.     "

## 2024-01-29 ENCOUNTER — Telehealth: Payer: Self-pay | Admitting: Family Medicine

## 2024-01-29 MED ORDER — OSELTAMIVIR PHOSPHATE 75 MG PO CAPS: 75.0000 mg | ORAL_CAPSULE | Freq: Every day | ORAL | 0 refills | Status: AC

## 2024-01-29 NOTE — Telephone Encounter (Signed)
 sent

## 2024-01-29 NOTE — Telephone Encounter (Signed)
 Patients wife requesting Tamiflu  for patient due to being in close contact with person positive for Flu A. Patient leaving town this afternoon so requesting script to be sent asap.

## 2024-01-29 NOTE — Addendum Note (Signed)
 Addended by: KATRINKA GARNETTE KIDD on: 01/29/2024 07:50 PM   Modules accepted: Orders

## 2024-02-02 ENCOUNTER — Other Ambulatory Visit: Payer: Self-pay | Admitting: Family Medicine

## 2024-02-02 MED ORDER — HYDROCHLOROTHIAZIDE 12.5 MG PO TABS: 12.5000 mg | ORAL_TABLET | Freq: Every day | ORAL | 3 refills | Status: AC

## 2024-02-02 MED ORDER — FENOFIBRATE 48 MG PO TABS: 48.0000 mg | ORAL_TABLET | Freq: Every day | ORAL | 3 refills | Status: AC

## 2024-02-05 ENCOUNTER — Encounter (HOSPITAL_BASED_OUTPATIENT_CLINIC_OR_DEPARTMENT_OTHER): Attending: General Surgery | Admitting: General Surgery

## 2024-02-05 DIAGNOSIS — L97514 Non-pressure chronic ulcer of other part of right foot with necrosis of bone: Secondary | ICD-10-CM | POA: Diagnosis not present

## 2024-02-05 DIAGNOSIS — E11621 Type 2 diabetes mellitus with foot ulcer: Secondary | ICD-10-CM | POA: Diagnosis present

## 2024-02-10 ENCOUNTER — Telehealth: Payer: Self-pay | Admitting: Podiatry

## 2024-02-10 ENCOUNTER — Other Ambulatory Visit: Payer: Self-pay | Admitting: Lab

## 2024-02-10 ENCOUNTER — Ambulatory Visit: Admitting: Podiatry

## 2024-02-10 DIAGNOSIS — E11621 Type 2 diabetes mellitus with foot ulcer: Secondary | ICD-10-CM | POA: Diagnosis not present

## 2024-02-10 DIAGNOSIS — L97412 Non-pressure chronic ulcer of right heel and midfoot with fat layer exposed: Secondary | ICD-10-CM

## 2024-02-10 MED ORDER — DAKINS (1/2 STRENGTH) 0.25 % EX SOLN: 1.0000 | Freq: Two times a day (BID) | CUTANEOUS | 0 refills | Status: AC

## 2024-02-10 NOTE — Progress Notes (Addendum)
 "  Subjective:  Patient ID: [name], male    DOB: May 04, 1970,  MRN: [id number]  Chief Complaint  Patient presents with   Foot Ulcer    54 y.o. male presents for wound care.  Patient presents with right lateral hindfoot ulceration.  He states the [place] solution is helping.  The wound is still about the same denies any other acute complaints.  Review of Systems: Negative except as noted in the HPI. Denies N/V/F/Ch.  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   (06-17-2019 pt stated has been stable with no changes and followed by pcp)   Tibial artery occlusion    11-06-2016  lower extremity angiography (dr eliza)  mil tibial disease w/ occlusion of the distal peroneal artery and dorsalis pedis artery but has widely patent posterior tibial and anterior tibial arterties   Type 2 diabetes mellitus treated with insulin  (HCC)    followed by pcp   (06-17-2019  pt stated checks blood sugar daily in am,  fasting sugar-- average 71)   Vitamin B 12 deficiency     Current Outpatient Medications:    ascorbic acid  (VITAMIN C ) 500 MG tablet, Take 500 mg by mouth daily., Disp: , Rfl:    aspirin  EC 81 MG tablet, Take 81 mg by mouth daily., Disp: , Rfl:    atorvastatin  (LIPITOR) 80 MG tablet, Take 1 tablet (80 mg total) by mouth daily., Disp: 90 tablet, Rfl: 3   Cyanocobalamin  (VITAMIN B-12) 5000 MCG SUBL, Place 5,000 mcg under the  tongue daily., Disp: , Rfl:    doxycycline  (VIBRA -TABS) 100 MG tablet, Take 1 tablet (100 mg total) by mouth 2 (two) times daily., Disp: 60 tablet, Rfl: 0   doxycycline  (VIBRA -TABS) 100 MG tablet, Take 1 tablet (100 mg total) by mouth 2 (two) times daily., Disp: 60 tablet, Rfl: 0   fenofibrate  (TRICOR ) 48 MG tablet, Take 1 tablet (48 mg total) by mouth daily., Disp: 90 tablet, Rfl: 3   Ferrous Sulfate  (IRON PO), Take 1 tablet by mouth daily., Disp: , Rfl:    hydrochlorothiazide  (HYDRODIURIL ) 12.5 MG tablet, Take 1 tablet (12.5 mg total) by mouth daily., Disp: 90 tablet, Rfl: 3   ibuprofen  (ADVIL ) 800 MG tablet, TAKE 1 TABLET BY MOUTH EVERY 6 HOURS AS NEEDED., Disp: 60 tablet, Rfl: 1   insulin  aspart (NOVOLOG  FLEXPEN) 100 UNIT/ML FlexPen, Inject 10-15 Units into the skin 2 (two) times daily., Disp: 15 mL, Rfl: 11   insulin  degludec (TRESIBA  FLEXTOUCH) 200 UNIT/ML FlexTouch Pen, Inject 90 Units into the skin daily., Disp: 45 mL, Rfl: 3   Insulin  Pen Needle (B-D UF III MINI PEN NEEDLES) 31G X 5 MM MISC, USE TO INJECT INSULIN  THREE TIMES DAILY. DX: E11.9, Disp: 100 each, Rfl: 11   Multiple Vitamin (MULTIVITAMIN WITH MINERALS) TABS tablet, Take 1 tablet by mouth daily., Disp: ,  Rfl:    Multiple Vitamins-Minerals (ZINC  PO), Take 1 tablet by mouth daily., Disp: , Rfl:    Omega-3 Fatty Acids (FISH OIL) 1360 MG CAPS, Take 1,360 mg by mouth daily. , Disp: , Rfl:    oseltamivir  (TAMIFLU ) 75 MG capsule, Take 1 capsule (75 mg total) by mouth daily. For prophylaxis, Disp: 10 capsule, Rfl: 0   pyridoxine  (B-6) 100 MG tablet, Take 100 mg by mouth daily., Disp: , Rfl:    ramipril  (ALTACE ) 5 MG capsule, Take 1 capsule (5 mg total) by mouth daily., Disp: 90 capsule, Rfl: 3   sodium hypochlorite (DAKIN'S 1/2 STRENGTH) external solution, Irrigate with 1 Application as directed in the morning and at bedtime., Disp: 473 mL, Rfl: 0   tirzepatide  (MOUNJARO ) 7.5 MG/0.5ML Pen, Inject 7.5 mg into the skin once a week., Disp: 2  mL, Rfl: 5  Social History   Tobacco Use  Smoking Status Never  Smokeless Tobacco Never    No Known Allergies Objective:  There were no vitals filed for this visit. There is no height or weight on file to calculate BMI. Constitutional Well developed. Well nourished.  Vascular Dorsalis pedis pulses palpable bilaterally. Posterior tibial pulses palpable bilaterally. Capillary refill normal to all digits.  No cyanosis or clubbing noted. Pedal hair growth normal.  Neurologic Normal speech. Oriented to person, place, and time. Protective sensation absent  Dermatologic Wound Location: Right lateral hindfoot with fat layer exposed.  Does not expose any bone.  No purulent drainage noted no malodor present Wound Base: Mixed Granular/Fibrotic Peri-wound: Calloused Exudate: Scant/small amount Serous exudate Wound Measurements: -See below  Orthopedic: No pain to palpation either foot.   Radiographs: IMPRESSION: 1. Intense marrow edema throughout the cuboid deep to a skin ulceration consistent with osteomyelitis. Milder degree of marrow edema in the lateral cuneiform is also likely due to osteomyelitis. 2. Negative for abscess. 3. 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 4. Degenerative or neuropathic change about the second and third TMT joints. Assessment:   No diagnosis found.     Plan:  Patient was evaluated and treated and all questions answered.  Ulcer right lateral hindfoot ulcer -Debridement as below. -Dressed with Dakin solution and Betadine  solution together once a day -Continue off-loading with surgical shoe. - MRI does show concern for osteomyelitis however clinically the wound has improved considerably with offloading techniques and Hydrofera Blue pads and Dakin's.  Will continue to monitor and keep doing this if it continues to improve patient may not need amputation.  However if patient experiences any clinical signs of infection  we will proceed with midfoot amputation.  Patient is in agreement with the plan  Procedure: Excisional Debridement of Wound Tool: Sharp chisel blade/tissue nipper Rationale: Removal of non-viable soft tissue from the wound to promote healing.  Anesthesia: none Pre-Debridement Wound Measurements: 1.1 cm x 1.2 cm x 0.5 cm Post-Debridement Wound Measurements: 1.4 cm x 1.2 cm x 0.6 cm Type of Debridement: Sharp Excisional Tissue Removed: Non-viable soft tissue Blood loss: Minimal (<50cc) Depth of Debridement: subcutaneous tissue. Technique: Sharp excisional debridement to bleeding, viable wound base.  Wound Progress: The has become more granular and regressed since previous visit Disposition: Patient tolerated procedure well. Patient to return in 1 week for follow-up.  No follow-ups on file.     "

## 2024-02-10 NOTE — Telephone Encounter (Signed)
 Patient's wife called requesting a refill of the wound solution. Patient's preferred pharmacy is the [facility] in [place].

## 2024-02-12 ENCOUNTER — Encounter (HOSPITAL_BASED_OUTPATIENT_CLINIC_OR_DEPARTMENT_OTHER): Admitting: General Surgery

## 2024-02-16 ENCOUNTER — Telehealth: Payer: Self-pay | Admitting: Family Medicine

## 2024-02-16 NOTE — Telephone Encounter (Signed)
 [facility]. Clarified instructions for Ramipril .   Copied from CRM #[id number]. Topic: Clinical - Medication Question >> Feb 15, 2024  5:59 PM Drema MATSU wrote: Reason for CRM: Mylinda is needing clarification on the directions for ramipril  (ALTACE ) 5 MG capsule.

## 2024-02-21 ENCOUNTER — Telehealth: Payer: Self-pay | Admitting: Family Medicine

## 2024-02-21 NOTE — Telephone Encounter (Signed)
 LVM to r/s 02/22/24 due to weather

## 2024-02-22 ENCOUNTER — Encounter: Admitting: Family Medicine

## 2024-02-29 ENCOUNTER — Ambulatory Visit: Admitting: Family Medicine

## 2024-03-02 ENCOUNTER — Ambulatory Visit: Admitting: Podiatry
